# Patient Record
Sex: Male | Born: 1945 | Race: White | Hispanic: No | Marital: Married | Smoking: Never smoker
Health system: Southern US, Community
[De-identification: ages and names within clinical notes are randomized; demographics above are authoritative.]

## PROBLEM LIST (undated history)

## (undated) DIAGNOSIS — M51369 Other intervertebral disc degeneration, lumbar region without mention of lumbar back pain or lower extremity pain: Secondary | ICD-10-CM

## (undated) DIAGNOSIS — I219 Acute myocardial infarction, unspecified: Secondary | ICD-10-CM

## (undated) DIAGNOSIS — I10 Essential (primary) hypertension: Secondary | ICD-10-CM

## (undated) DIAGNOSIS — I5022 Chronic systolic (congestive) heart failure: Secondary | ICD-10-CM

## (undated) DIAGNOSIS — H9190 Unspecified hearing loss, unspecified ear: Secondary | ICD-10-CM

## (undated) DIAGNOSIS — F1011 Alcohol abuse, in remission: Secondary | ICD-10-CM

## (undated) DIAGNOSIS — E119 Type 2 diabetes mellitus without complications: Secondary | ICD-10-CM

## (undated) DIAGNOSIS — I739 Peripheral vascular disease, unspecified: Secondary | ICD-10-CM

## (undated) DIAGNOSIS — Z972 Presence of dental prosthetic device (complete) (partial): Secondary | ICD-10-CM

## (undated) DIAGNOSIS — Z7902 Long term (current) use of antithrombotics/antiplatelets: Secondary | ICD-10-CM

## (undated) DIAGNOSIS — K08109 Complete loss of teeth, unspecified cause, unspecified class: Secondary | ICD-10-CM

## (undated) DIAGNOSIS — I6529 Occlusion and stenosis of unspecified carotid artery: Secondary | ICD-10-CM

## (undated) DIAGNOSIS — I2510 Atherosclerotic heart disease of native coronary artery without angina pectoris: Secondary | ICD-10-CM

## (undated) DIAGNOSIS — M1990 Unspecified osteoarthritis, unspecified site: Secondary | ICD-10-CM

## (undated) DIAGNOSIS — G629 Polyneuropathy, unspecified: Secondary | ICD-10-CM

## (undated) DIAGNOSIS — M5030 Other cervical disc degeneration, unspecified cervical region: Secondary | ICD-10-CM

## (undated) DIAGNOSIS — S32009A Unspecified fracture of unspecified lumbar vertebra, initial encounter for closed fracture: Secondary | ICD-10-CM

## (undated) DIAGNOSIS — K5790 Diverticulosis of intestine, part unspecified, without perforation or abscess without bleeding: Secondary | ICD-10-CM

## (undated) DIAGNOSIS — L03119 Cellulitis of unspecified part of limb: Secondary | ICD-10-CM

## (undated) DIAGNOSIS — Z955 Presence of coronary angioplasty implant and graft: Secondary | ICD-10-CM

## (undated) DIAGNOSIS — T7840XA Allergy, unspecified, initial encounter: Secondary | ICD-10-CM

## (undated) DIAGNOSIS — M5416 Radiculopathy, lumbar region: Secondary | ICD-10-CM

## (undated) DIAGNOSIS — Z794 Long term (current) use of insulin: Secondary | ICD-10-CM

## (undated) DIAGNOSIS — M5136 Other intervertebral disc degeneration, lumbar region: Secondary | ICD-10-CM

## (undated) DIAGNOSIS — G4730 Sleep apnea, unspecified: Secondary | ICD-10-CM

## (undated) DIAGNOSIS — E785 Hyperlipidemia, unspecified: Secondary | ICD-10-CM

## (undated) DIAGNOSIS — G4733 Obstructive sleep apnea (adult) (pediatric): Secondary | ICD-10-CM

## (undated) DIAGNOSIS — Z7982 Long term (current) use of aspirin: Secondary | ICD-10-CM

## (undated) DIAGNOSIS — K429 Umbilical hernia without obstruction or gangrene: Secondary | ICD-10-CM

## (undated) DIAGNOSIS — M109 Gout, unspecified: Secondary | ICD-10-CM

## (undated) HISTORY — DX: Occlusion and stenosis of unspecified carotid artery: I65.29

## (undated) HISTORY — DX: Hyperlipidemia, unspecified: E78.5

## (undated) HISTORY — DX: Unspecified osteoarthritis, unspecified site: M19.90

## (undated) HISTORY — DX: Atherosclerotic heart disease of native coronary artery without angina pectoris: I25.10

## (undated) HISTORY — PX: COLONOSCOPY: SHX174

## (undated) HISTORY — DX: Essential (primary) hypertension: I10

## (undated) HISTORY — PX: TONSILLECTOMY: SUR1361

## (undated) HISTORY — DX: Acute myocardial infarction, unspecified: I21.9

## (undated) HISTORY — DX: Type 2 diabetes mellitus without complications: E11.9

---

## 2001-09-03 ENCOUNTER — Encounter: Payer: Self-pay | Admitting: Neurology

## 2001-09-03 ENCOUNTER — Encounter: Admission: RE | Admit: 2001-09-03 | Discharge: 2001-09-03 | Payer: Self-pay | Admitting: Neurology

## 2004-07-13 ENCOUNTER — Ambulatory Visit: Payer: Self-pay

## 2006-04-22 DIAGNOSIS — I219 Acute myocardial infarction, unspecified: Secondary | ICD-10-CM

## 2006-04-22 HISTORY — DX: Acute myocardial infarction, unspecified: I21.9

## 2007-03-02 ENCOUNTER — Ambulatory Visit: Payer: Self-pay | Admitting: Internal Medicine

## 2007-06-15 ENCOUNTER — Ambulatory Visit: Payer: Self-pay | Admitting: Internal Medicine

## 2009-08-06 ENCOUNTER — Emergency Department: Payer: Self-pay | Admitting: Emergency Medicine

## 2010-03-22 HISTORY — PX: CORONARY ANGIOPLASTY WITH STENT PLACEMENT: SHX49

## 2010-03-22 HISTORY — PX: CORONARY ANGIOPLASTY: SHX604

## 2010-04-06 ENCOUNTER — Inpatient Hospital Stay: Payer: Self-pay | Admitting: Internal Medicine

## 2010-04-19 ENCOUNTER — Emergency Department: Payer: Self-pay | Admitting: Emergency Medicine

## 2010-04-19 DIAGNOSIS — I255 Ischemic cardiomyopathy: Secondary | ICD-10-CM

## 2010-04-19 DIAGNOSIS — I2510 Atherosclerotic heart disease of native coronary artery without angina pectoris: Secondary | ICD-10-CM

## 2010-04-19 DIAGNOSIS — I2119 ST elevation (STEMI) myocardial infarction involving other coronary artery of inferior wall: Secondary | ICD-10-CM

## 2010-04-19 HISTORY — DX: Atherosclerotic heart disease of native coronary artery without angina pectoris: I25.10

## 2010-04-19 HISTORY — DX: Ischemic cardiomyopathy: I25.5

## 2010-04-19 HISTORY — DX: ST elevation (STEMI) myocardial infarction involving other coronary artery of inferior wall: I21.19

## 2010-05-01 ENCOUNTER — Ambulatory Visit: Payer: Self-pay | Admitting: Internal Medicine

## 2010-05-23 ENCOUNTER — Ambulatory Visit: Payer: Self-pay | Admitting: Internal Medicine

## 2010-06-21 ENCOUNTER — Ambulatory Visit: Payer: Self-pay | Admitting: Internal Medicine

## 2010-07-22 ENCOUNTER — Ambulatory Visit: Payer: Self-pay | Admitting: Internal Medicine

## 2012-02-16 ENCOUNTER — Emergency Department: Payer: Self-pay | Admitting: Emergency Medicine

## 2012-02-16 LAB — CBC
HCT: 44.4 % (ref 40.0–52.0)
HGB: 15.7 g/dL (ref 13.0–18.0)
MCH: 32 pg (ref 26.0–34.0)
MCHC: 35.3 g/dL (ref 32.0–36.0)
MCV: 91 fL (ref 80–100)
Platelet: 176 10*3/uL (ref 150–440)
RBC: 4.9 10*6/uL (ref 4.40–5.90)
RDW: 13 % (ref 11.5–14.5)
WBC: 7 10*3/uL (ref 3.8–10.6)

## 2012-02-16 LAB — COMPREHENSIVE METABOLIC PANEL
Albumin: 4.4 g/dL (ref 3.4–5.0)
Alkaline Phosphatase: 77 U/L (ref 50–136)
Anion Gap: 10 (ref 7–16)
BUN: 10 mg/dL (ref 7–18)
Bilirubin,Total: 0.6 mg/dL (ref 0.2–1.0)
Calcium, Total: 9.6 mg/dL (ref 8.5–10.1)
Chloride: 102 mmol/L (ref 98–107)
Co2: 25 mmol/L (ref 21–32)
Creatinine: 0.72 mg/dL (ref 0.60–1.30)
EGFR (African American): >60
EGFR (Non-African Amer.): >60
Glucose: 154 mg/dL — ABNORMAL HIGH (ref 65–99)
Osmolality: 276 (ref 275–301)
Potassium: 3.8 mmol/L (ref 3.5–5.1)
SGOT(AST): 52 U/L — ABNORMAL HIGH (ref 15–37)
SGPT (ALT): 101 U/L — ABNORMAL HIGH (ref 12–78)
Sodium: 137 mmol/L (ref 136–145)
Total Protein: 7.5 g/dL (ref 6.4–8.2)

## 2012-02-16 LAB — TROPONIN I: Troponin-I: <0.02 ng/mL

## 2012-02-16 LAB — CK TOTAL AND CKMB (NOT AT ARMC)
CK, Total: 99 U/L (ref 35–232)
CK-MB: 1.9 ng/mL (ref 0.5–3.6)

## 2013-09-14 DIAGNOSIS — E782 Mixed hyperlipidemia: Secondary | ICD-10-CM | POA: Insufficient documentation

## 2013-09-21 DIAGNOSIS — M109 Gout, unspecified: Secondary | ICD-10-CM | POA: Insufficient documentation

## 2013-12-19 DIAGNOSIS — R809 Proteinuria, unspecified: Secondary | ICD-10-CM | POA: Insufficient documentation

## 2014-04-05 DIAGNOSIS — I6523 Occlusion and stenosis of bilateral carotid arteries: Secondary | ICD-10-CM | POA: Insufficient documentation

## 2014-08-09 NOTE — Consult Note (Signed)
PATIENT NAME:  Manuel [name], Manuel [name] MR#:  [id number] DATE OF BIRTH:  Oct 21, 1945  DATE OF CONSULTATION:  02/16/2012  REFERRING PHYSICIAN:   CONSULTING PHYSICIAN:  Lorma Heater H. Allena KatzPatel, MD  PRIMARY CARE PROVIDER: Dr. Yates DecampJohn Walker   ED REFERRING DOCTOR: Dr. Dorothea GlassmanPaul Malinda   CARDIOLOGIST: Dr. Gwen PoundsKowalski   CHIEF COMPLAINT: "I am not feeling well".    HISTORY OF PRESENT ILLNESS: The patient is a 69 year old white male with history of hypertension, diabetes, gout, sleep apnea, and hyperlipidemia who also has a history of coronary artery disease and had a stent placed two years ago who reports that he was doing okay up until about five days ago when he started "feeling bad". He reports that prior to having his stent placed he had symptoms of generalized weakness and feeling bad followed by chest pain leading to his stent. Due to these previous symptoms he came to the ED. When he arrived in the ED, blood work and evaluation was done which shows that his troponin was negative. EKG showed no acute abnormality. I was asked to admit the patient and when I discussed the results of the findings he stated that he would like to be discharged home and see Dr. Gwen PoundsKowalski as an outpatient. He currently has no chest pain. He denies any shortness of breath. He denies any dyspnea on exertion. No lower extremity swelling, nocturnal dyspnea, or orthopnea.   PAST MEDICAL HISTORY:  1. Diabetes.  2. Hypertension.  3. Hyperlipidemia.  4. Sleep apnea, unable to tolerate CPAP. Currently he states that he has used three CPAP machines in the past and probably would want to restart using his CPAP.  5. Chronic low back pain.  6. History of diverticulosis and diverticulitis. 7. History of gout. No flare in the past few years. 8. History of abnormal liver function studies with negative hepatitis work-up. 9. History of coronary artery disease with two stents.   ALLERGIES: None.   CURRENT MEDICATIONS:  1. Aspirin 1 tab [name].o. daily.   2. Carvedilol 25 one tab [name].o. b.i.d.  3. Enalapril 20 daily.  4. Glipizide 5 mg daily.  5. Magnesium gluconate 300 mg daily.  6. Metformin 1000 daily.  7. Plavix 75 [name].o. daily.  8. Pravastatin 80 mg at bedtime.   SOCIAL HISTORY: Does not smoke. Drinks socially. No drug abuse.   FAMILY HISTORY: Father had coronary artery bypass and also had hypertension.   REVIEW OF SYSTEMS: CONSTITUTIONAL: Denies any fevers. Complains of fatigue, weakness. No pain. No weight loss. No weight gain. EYES: No blurred or double vision. No redness. No inflammation. No glaucoma. No cataracts. ENT: No tinnitus. No ear pain. No hearing loss. No seasonal or year-round allergies. No epistaxis. No difficulty swallowing. RESPIRATORY: Denies any cough, wheezing, hemoptysis. No COPD. CARDIOVASCULAR: Denies any chest pain, orthopnea, edema, or arrhythmia. No dyspnea on exertion. No palpitations. GI: No nausea, vomiting, diarrhea. No abdominal pain. No hematemesis. No melena. No ulcers. No gastroesophageal reflux disease. GU: Denies any dysuria, hematuria, renal calculus, or frequency. ENDOCRINE: Denies any polyuria, nocturia, or thyroid problems. HEME/LYMPH: Denies anemia, easy bruisability or bleeding. SKIN: No acne. No rash. No changes in mole, hair or skin. MUSCULOSKELETAL: Denies any pain in the neck, back, or shoulder. NEUROLOGIC: No numbness. No CVA. No TIA. PSYCHIATRIC: No anxiety. No insomnia. No ADD.   PHYSICAL EXAMINATION:   VITAL SIGNS: Temperature 96.9, pulse 72, respirations 20, blood pressure initially 181/99, O2 98%.   GENERAL: The patient is a well developed, well nourished Caucasian male  in no acute distress.   HEENT: Head atraumatic, normocephalic. Pupils equally round, reactive to light and accommodation. There is no conjunctival pallor. No scleral icterus. Nasal exam shows no drainage or ulceration. Oropharynx is clear without any exudate.   NECK: No thyromegaly. No carotid bruits.   CARDIOVASCULAR:  Regular rate and rhythm. No murmurs, rubs, clicks, or gallops. PMI is not displaced.   LUNGS: Clear to auscultation bilaterally without any rales, rhonchi, or wheezing.   ABDOMEN: Soft, nontender, nondistended. Positive bowel sounds x4.   EXTREMITIES: No clubbing, cyanosis, or edema.   SKIN: No rash.   LYMPHATICS: No lymph nodes palpable.   VASCULAR: Good DP, PT pulses.   PSYCHIATRIC: Not anxious or depressed.   NEUROLOGICAL: Awake, alert, oriented x3. No focal deficits.   MUSCULOSKELETAL: There is no reproducible chest pain.   LABORATORY, DIAGNOSTIC, AND RADIOLOGICAL DATA: Glucose 154, BUN 10, creatinine 0.72, sodium 137, potassium 3.8, chloride 102, CO2 25, calcium 9.6. LFTs total protein 7.5, albumin 4.4, bilirubin total 0.6, alkaline phosphatase 77, AST 52, ALT 101. CPK 99. CK-MB 1.9. Troponin less than 0.02. WBC 7.0, hemoglobin 15.7, platelet count 176.   EKG showed normal sinus rhythm without any ST-T wave changes.   ASSESSMENT AND PLAN: The patient is a 69 year old white male with history of coronary artery disease and previous stents who presents with vague symptoms of generalized weakness ongoing for about five days in duration. He has no active chest pain. No shortness of breath. He does relate similar type of symptoms to his previous stents in the past. At this point offered him to bring him into the hospital for observation and have his cardiologist evaluate in the morning, however, the patient is interested in seeing his cardiologist tomorrow. He will be calling Dr. [name] to decide what further needs to be done. I recommended to the patient that if he starts having any chest pain or any shortness of breath he needs to come back to the ED and will need evaluation. At this time he is being discharged and he's stable per his request. He is to continue the rest of his other medications for his diabetes, hypertension, and high cholesterol.   TIME SPENT ON CONSULT: 40 minutes.    Discharge instructions were written for the patient.   ____________________________ Lacie Scotts. Allena Katz, MD shp:drc D: 02/16/2012 21:37:00 ET T: 02/17/2012 06:27:10 ET JOB#: [id number]  cc: Mckyla Deckman H. Allena Katz, MD, <Dictator> John B. Danne Harbor, MD Lamar Blinks, MD Charise Carwin MD ELECTRONICALLY SIGNED 02/21/2012 17:33

## 2014-10-05 DIAGNOSIS — I10 Essential (primary) hypertension: Secondary | ICD-10-CM | POA: Insufficient documentation

## 2014-10-18 DIAGNOSIS — Z794 Long term (current) use of insulin: Secondary | ICD-10-CM | POA: Insufficient documentation

## 2014-10-18 DIAGNOSIS — E119 Type 2 diabetes mellitus without complications: Secondary | ICD-10-CM | POA: Insufficient documentation

## 2017-01-02 ENCOUNTER — Other Ambulatory Visit: Payer: Self-pay | Admitting: Student

## 2017-01-02 DIAGNOSIS — M5440 Lumbago with sciatica, unspecified side: Secondary | ICD-10-CM

## 2017-01-03 ENCOUNTER — Other Ambulatory Visit: Payer: Self-pay | Admitting: Neurosurgery

## 2017-01-03 DIAGNOSIS — M5440 Lumbago with sciatica, unspecified side: Secondary | ICD-10-CM

## 2017-01-14 ENCOUNTER — Ambulatory Visit
Admission: RE | Admit: 2017-01-14 | Discharge: 2017-01-14 | Disposition: A | Discharge status: Home / Self Care | Payer: 59 | Source: Ambulatory Visit | Attending: Student | Admitting: Student

## 2017-01-14 DIAGNOSIS — M4316 Spondylolisthesis, lumbar region: Secondary | ICD-10-CM | POA: Insufficient documentation

## 2017-01-14 DIAGNOSIS — M48061 Spinal stenosis, lumbar region without neurogenic claudication: Secondary | ICD-10-CM | POA: Diagnosis not present

## 2017-01-14 DIAGNOSIS — M5440 Lumbago with sciatica, unspecified side: Secondary | ICD-10-CM

## 2017-01-27 ENCOUNTER — Ambulatory Visit
Admission: RE | Admit: 2017-01-27 | Discharge: 2017-01-27 | Disposition: A | Discharge status: Home / Self Care | Payer: 59 | Source: Ambulatory Visit | Attending: Student in an Organized Health Care Education/Training Program | Admitting: Student in an Organized Health Care Education/Training Program

## 2017-01-27 ENCOUNTER — Ambulatory Visit (HOSPITAL_BASED_OUTPATIENT_CLINIC_OR_DEPARTMENT_OTHER): Payer: 59 | Admitting: Student in an Organized Health Care Education/Training Program

## 2017-01-27 ENCOUNTER — Encounter: Payer: Self-pay | Admitting: Student in an Organized Health Care Education/Training Program

## 2017-01-27 VITALS — BP 160/95 | HR 72 | Temp 98.4°F | Resp 18 | Ht 68.0 in | Wt 190.0 lb

## 2017-01-27 DIAGNOSIS — I252 Old myocardial infarction: Secondary | ICD-10-CM | POA: Diagnosis not present

## 2017-01-27 DIAGNOSIS — M5116 Intervertebral disc disorders with radiculopathy, lumbar region: Secondary | ICD-10-CM | POA: Insufficient documentation

## 2017-01-27 DIAGNOSIS — M25552 Pain in left hip: Secondary | ICD-10-CM | POA: Diagnosis present

## 2017-01-27 DIAGNOSIS — M5416 Radiculopathy, lumbar region: Secondary | ICD-10-CM | POA: Diagnosis not present

## 2017-01-27 DIAGNOSIS — M51369 Other intervertebral disc degeneration, lumbar region without mention of lumbar back pain or lower extremity pain: Secondary | ICD-10-CM | POA: Insufficient documentation

## 2017-01-27 DIAGNOSIS — Z79899 Other long term (current) drug therapy: Secondary | ICD-10-CM | POA: Insufficient documentation

## 2017-01-27 DIAGNOSIS — M5136 Other intervertebral disc degeneration, lumbar region: Secondary | ICD-10-CM

## 2017-01-27 DIAGNOSIS — E785 Hyperlipidemia, unspecified: Secondary | ICD-10-CM | POA: Insufficient documentation

## 2017-01-27 DIAGNOSIS — I10 Essential (primary) hypertension: Secondary | ICD-10-CM | POA: Diagnosis not present

## 2017-01-27 DIAGNOSIS — M47816 Spondylosis without myelopathy or radiculopathy, lumbar region: Secondary | ICD-10-CM

## 2017-01-27 DIAGNOSIS — Z794 Long term (current) use of insulin: Secondary | ICD-10-CM | POA: Diagnosis not present

## 2017-01-27 DIAGNOSIS — R531 Weakness: Secondary | ICD-10-CM | POA: Diagnosis not present

## 2017-01-27 DIAGNOSIS — E119 Type 2 diabetes mellitus without complications: Secondary | ICD-10-CM | POA: Insufficient documentation

## 2017-01-27 MED ORDER — SODIUM CHLORIDE 0.9 % IJ SOLN
INTRAMUSCULAR | Status: AC
  Filled 2017-01-27: qty 10

## 2017-01-27 MED ORDER — IOPAMIDOL (ISOVUE-M 200) INJECTION 41%
10.0000 mL | Freq: Once | INTRAMUSCULAR | Status: AC
  Administered 2017-01-27: 20 mL
  Filled 2017-01-27: qty 10

## 2017-01-27 MED ORDER — LIDOCAINE HCL (PF) 1 % IJ SOLN
10.0000 mL | Freq: Once | INTRAMUSCULAR | Status: AC
  Administered 2017-01-27: 5 mL
  Filled 2017-01-27: qty 10

## 2017-01-27 MED ORDER — DEXAMETHASONE SODIUM PHOSPHATE 10 MG/ML IJ SOLN
10.0000 mg | Freq: Once | INTRAMUSCULAR | Status: AC
  Administered 2017-01-27: 10 mg
  Filled 2017-01-27: qty 1

## 2017-01-27 MED ORDER — ROPIVACAINE HCL 2 MG/ML IJ SOLN
9.0000 mL | Freq: Once | INTRAMUSCULAR | Status: AC
  Administered 2017-01-27: 10 mL via INTRA_ARTICULAR
  Filled 2017-01-27: qty 10

## 2017-01-27 MED ORDER — SODIUM CHLORIDE 0.9% FLUSH
2.0000 mL | Freq: Once | INTRAVENOUS | Status: AC
  Administered 2017-01-27: 10 mL

## 2017-01-27 NOTE — Patient Instructions (Signed)
Pain Management Discharge Instructions  General Discharge Instructions :  If you need to reach your doctor call: Monday-Friday 8:00 am - 4:00 pm at [phone] or toll free [phone].  After clinic hours [phone] to have operator reach doctor.  Bring all of your medication bottles to all your appointments in the pain clinic.  To cancel or reschedule your appointment with Pain Management please remember to call 24 hours in advance to avoid a fee.  Refer to the educational materials which you have been given on: General Risks, I had my Procedure. Discharge Instructions, Post Sedation.  Post Procedure Instructions:  The drugs you were given will stay in your system until tomorrow, so for the next 24 hours you should not drive, make any legal decisions or drink any alcoholic beverages.  You may eat anything you prefer, but it is better to start with liquids then soups and crackers, and gradually work up to solid foods.  Please notify your doctor immediately if you have any unusual bleeding, trouble breathing or pain that is not related to your normal pain.  Depending on the type of procedure that was done, some parts of your body may feel week and/or numb.  This usually clears up by tonight or the next day.  Walk with the use of an assistive device or accompanied by an adult for the 24 hours.  You may use ice on the affected area for the first 24 hours.  Put ice in a Ziploc bag and cover with a towel and place against area 15 minutes on 15 minutes off.  You may switch to heat after 24 hours.Epidural Steroid Injection An epidural steroid injection is a shot of steroid medicine and numbing medicine that is given into the space between the spinal cord and the bones in your back (epidural space). The shot helps relieve pain caused by an irritated or swollen nerve root. The amount of pain relief you get from the injection depends on what is causing the nerve to be swollen and irritated,  and how long your pain lasts. You are more likely to benefit from this injection if your pain is strong and comes on suddenly rather than if you have had pain for a long time. Tell a health care provider about:  Any allergies you have.  All medicines you are taking, including vitamins, herbs, eye drops, creams, and over-the-counter medicines.  Any problems you or family members have had with anesthetic medicines.  Any blood disorders you have.  Any surgeries you have had.  Any medical conditions you have.  Whether you are pregnant or may be pregnant. What are the risks? Generally, this is a safe procedure. However, problems may occur, including:  Headache.  Bleeding.  Infection.  Allergic reaction to medicines.  Damage to your nerves. What happens before the procedure? Staying hydrated  Follow instructions from your health care provider about hydration, which may include:  Up to 2 hours before the procedure - you may continue to drink clear liquids, such as water, clear fruit juice, black coffee, and plain tea. Eating and drinking restrictions  Follow instructions from your health care provider about eating and drinking, which may include:  8 hours before the procedure - stop eating heavy meals or foods such as meat, fried foods, or fatty foods.  6 hours before the procedure - stop eating light meals or foods, such as toast or cereal.  6 hours before the procedure - stop drinking milk or drinks that contain milk.    2 hours before the procedure - stop drinking clear liquids. Medicine  You may be given medicines to lower anxiety.  Ask your health care provider about:  Changing or stopping your regular medicines. This is especially important if you are taking diabetes medicines or blood thinners.  Taking medicines such as aspirin and ibuprofen. These medicines can thin your blood. Do not take these medicines before your procedure if your health care provider instructs  you not to. General instructions  Plan to have someone take you home from the hospital or clinic. What happens during the procedure?  You may receive a medicine to help you relax (sedative).  You will be asked to lie on your abdomen.  The injection site will be cleaned.  A numbing medicine (local anesthetic) will be used to numb the injection site.  A needle will be inserted through your skin into the epidural space. You may feel some discomfort when this happens. An X-ray machine will be used to make sure the needle is put as close as possible to the affected nerve.  A steroid medicine and a local anesthetic will be injected into the epidural space.  The needle will be removed.  A bandage (dressing) will be put over the injection site. What happens after the procedure?  Your blood pressure, heart rate, breathing rate, and blood oxygen level will be monitored until the medicines you were given have worn off.  Your arm or leg may feel weak or numb for a few hours.  The injection site may feel sore.  Do not drive for 24 hours if you received a sedative. This information is not intended to replace advice given to you by your health care provider. Make sure you discuss any questions you have with your health care provider. Document Released: 07/16/2007 Document Revised: 09/20/2015 Document Reviewed: 07/25/2015 [employer] Interactive Patient Education  2017 [employer]  

## 2017-01-27 NOTE — Progress Notes (Signed)
Patient's Name: Manuel [name]  MRN: [id number]  Referring Provider: Marin Olp, PA-C  DOB: 28-Jun-1945  PCP: Default, Provider, MD  DOS: 01/27/2017  Note by: Gillis Santa, MD  Service setting: Ambulatory outpatient  Specialty: Interventional Pain Management  Location: [facility] (AMB) Pain Management Facility    Patient type: New patient ("FAST-TRACK" Evaluation)   Warning: This referral option does not include the extensive pharmacological evaluation required for [place] to take over the patient's medication management. The "Fast-Track" system is designed to bypass the new patient referral waiting list, as well as the normal patient evaluation process, in order to provide a patient in distress with a timely pain management intervention. Because the system was not designed to unfairly get a patient into our pain practice ahead of those already waiting, certain restrictions apply. By requesting a "Fast-Track" consult, the referring physician has opted to continue managing the patient's medications in order to get interventional urgent care.  Primary Reason for Visit: Interventional Pain Management Treatment. CC: Hip Pain (left buttocks )   Procedure  HPI  Manuel [name] is a 71 y.o. year old, male patient, who comes today for a  "Fast-Track" new patient evaluation, as requested by Marin Olp, PA-C. The patient has been made aware that this type of referral option is reserved for the Interventional Pain Management portion of our practice and completely excludes the option of medication management. His primarily concern today is the Hip Pain (left buttocks )  Pain Assessment: Location: Left Buttocks Radiating: down the left leg on the outer aspect of the leg that went around to the inside of the calf and ankle. mainly on Left side but is now affecting the right side.  Onset: More than a month ago Duration: Chronic pain Quality: Dull, Discomfort, Nagging, Constant, Other (Comment) (weakness in left  leg) Severity: 3 /10 (self-reported pain score)  Note: Reported level is compatible with observation.                   When using our objective Pain Scale, levels between 6 and 10/10 are said to belong in an emergency room, as it progressively worsens from a 6/10, described as severely limiting, requiring emergency care not usually available at an outpatient pain management facility. At a 6/10 level, communication becomes difficult and requires great effort. Assistance to reach the emergency department may be required. Facial flushing and profuse sweating along with potentially dangerous increases in heart rate and blood pressure will be evident. Effect on ADL: has difficulty maintaining chores at home and increases the intensity of pain.  going up and down steps is difficult. feels that it has affected gait  Timing: Constant Modifying factors: advil  Onset and Duration: Sudden and Present less than 3 months Cause of pain: patient stepped wrong and felt something pop and has had pain since that time.  Severity: Getting better, NAS-11 at its worse: 10/10, NAS-11 at its best: 2/10, NAS-11 now: 3/10 and NAS-11 on the average: 5/10 Timing: Night Aggravating Factors: Climbing, Lifiting, Motion, Walking and Walking uphill Alleviating Factors: Cold packs, Sleeping and Chiropractic manipulations Associated Problems: Night-time cramps, Depression, Fatigue, Weakness and Pain that does not allow patient to sleep Quality of Pain: Constant, Dull and Nagging Previous Examinations or Tests: Chiropractic evaluation Previous Treatments: The patient denies any treatment  The patient comes into the clinics today, referred to [place] for a lumbar epidural steroid injection under fluoroscopy.  Meds   Current Outpatient Prescriptions:  .  amLODipine (NORVASC) 5 MG tablet,  Take 1 tablet by mouth daily., Disp: , Rfl:  .  carvedilol (COREG) 12.5 MG tablet, Take 1 tablet by mouth 2 (two) times daily., Disp: , Rfl:  .   insulin aspart protamine- aspart (NOVOLOG MIX 70/30) (70-30) 100 UNIT/ML injection, Inject 20 Units into the skin daily with breakfast., Disp: , Rfl:  .  lisinopril (PRINIVIL,ZESTRIL) 20 MG tablet, Take 1 tablet by mouth daily., Disp: , Rfl:  .  pravastatin (PRAVACHOL) 40 MG tablet, Take 1 tablet by mouth at bedtime., Disp: , Rfl:  .  amLODipine (NORVASC) 5 MG tablet, Take 1 tablet by mouth daily., Disp: , Rfl:  .  Ibuprofen 200 MG CAPS, Take 2 tablets by mouth 3 times/day as needed-between meals & bedtime., Disp: , Rfl:   Imaging Review  Cervical Imaging: Cervical MR wo contrast: No results found for this or any previous visit. Cervical MR wo contrast:  Results for orders placed in visit on 03/02/07  MR [facility] W/O Cm   Narrative  PRIOR REPORT IMPORTED FROM AN EXTERNAL SYSTEM   PRIOR REPORT IMPORTED FROM THE [facility] [place] EXAM:    neck pain  COMMENTS:   PROCEDURE:     MR  - MR CERVICAL SPINE WO CONT  - Mar 02 2007  8:17PM   RESULT:     Non gadolinium MR imaging of the cervical spine is performed  in  the standard fashion. The study demonstrates multilevel disc protrusion  most  pronounced at C7-T1 along with C6-C7 and C5-C6 with mild disc bulge at  C4-C5. There is no definite abnormal cord deformity or cord signal. No  severe spinal canal stenosis is evident. There is moderate spinal canal  narrowing at the C7-T1 level. There is bilateral foraminal stenosis at  C7-T1  with moderate to severe RIGHT foraminal stenosis at C6-C7 and RIGHT-sided  foraminal stenosis that is moderately severe at C5-C6 with RIGHT-sided  moderate spinal stenosis at C3-C4 and LEFT-sided severe foraminal stenosis  at C3-C4. The vertebral body heights and signals appear to be maintained  with no evidence of edema.   IMPRESSION:   Areas of spinal canal narrowing with foraminal stenosis. If the patient  has  radicular symptoms then surgical consultation would be recommended.    Thank you for the opportunity to contribute to the care of your patient.        Lumbosacral Imaging: Lumbar MR wo contrast:  Results for orders placed during the hospital encounter of 01/14/17  MR LUMBAR SPINE WO CONTRAST   Narrative CLINICAL DATA:  71 y/o M; lower back pain with left-greater-than-right lower extremity pain.  EXAM: MRI LUMBAR SPINE WITHOUT CONTRAST  TECHNIQUE: Multiplanar, multisequence MR imaging of the lumbar spine was performed. No intravenous contrast was administered.  COMPARISON:  None.  FINDINGS: Segmentation:  Standard.  Alignment: Mild lumbar levocurvature with apex at L4. L4-5 grade 1 anterolisthesis.  Vertebrae: Mild L2-3 and L3-4 degenerative endplate edema. No evidence for acute fracture, diskitis, or suspicious bone lesion.  Conus medullaris: Extends to the L1-2 level and appears normal.  Paraspinal and other soft tissues: Negative.  Disc levels:  L1-2: No significant disc displacement, foraminal stenosis, or canal stenosis. Mild facet hypertrophy.  L2-3: Disc bulge with mild facet hypertrophy. Mild foraminal and canal stenosis.  L3-4: Disc bulge with endplate marginal osteophytes combined with moderate facet and mild ligamentum hypertrophy. Mild bilateral foraminal stenosis and moderate canal stenosis.  L4-5: Anterolisthesis, disc bulge, and advanced facet hypertrophy with mild  ligamentum flavum hypertrophy. Mild bilateral foraminal stenosis and moderate to severe canal stenosis.  L5-S1: Disc bulge eccentric to the left with advanced bilateral facet hypertrophy. Mild bilateral foraminal stenosis. No significant canal stenosis.  IMPRESSION: 1. Mild lumbar levocurvature and L4-5 grade 1 anterolisthesis. 2. Moderate lumbar spondylosis with advanced lower lumbar facet arthropathy. 3. Multifactorial moderate L3-4 and moderate to severe L4-5 canal stenosis. 4. Multilevel mild foraminal stenosis. No high-grade  foraminal stenosis.   Electronically Signed   By: Kristine Garbe M.D.   On: 01/15/2017 06:43       Complexity Note: Imaging results reviewed. Results shared with Mr. Lacock, using Layman's terms.                         ROS  Cardiovascular History: High blood pressure and Heart attack ( Date: 2008) Pulmonary or Respiratory History: No reported pulmonary signs or symptoms such as wheezing and difficulty taking a deep full breath (Asthma), difficulty blowing air out (Emphysema), coughing up mucus (Bronchitis), persistent dry cough, or temporary stoppage of breathing during sleep Neurological History: No reported neurological signs or symptoms such as seizures, abnormal skin sensations, urinary and/or fecal incontinence, being born with an abnormal open spine and/or a tethered spinal cord Review of Past Neurological Studies: No results found for this or any previous visit. Psychological-Psychiatric History: No reported psychological or psychiatric signs or symptoms such as difficulty sleeping, anxiety, depression, delusions or hallucinations (schizophrenial), mood swings (bipolar disorders) or suicidal ideations or attempts Gastrointestinal History: No reported gastrointestinal signs or symptoms such as vomiting or evacuating blood, reflux, heartburn, alternating episodes of diarrhea and constipation, inflamed or scarred liver, or pancreas or irrregular and/or infrequent bowel movements Genitourinary History: No reported renal or genitourinary signs or symptoms such as difficulty voiding or producing urine, peeing blood, non-functioning kidney, kidney stones, difficulty emptying the bladder, difficulty controlling the flow of urine, or chronic kidney disease Hematological History: No reported hematological signs or symptoms such as prolonged bleeding, low or poor functioning platelets, bruising or bleeding easily, hereditary bleeding problems, low energy levels due to low hemoglobin or  being anemic Endocrine History: High blood sugar requiring insulin (IDDM) Rheumatologic History: No reported rheumatological signs and symptoms such as fatigue, joint pain, tenderness, swelling, redness, heat, stiffness, decreased range of motion, with or without associated rash Musculoskeletal History: Negative for myasthenia gravis, muscular dystrophy, multiple sclerosis or malignant hyperthermia Work History: Retired  Allergies  Mr. Ruz has No Known Allergies.  Laboratory Chemistry  Inflammation Markers (CRP: Acute Phase) (ESR: Chronic Phase) No results found for: CRP, ESRSEDRATE               Renal Function Markers Lab Results  Component Value Date   BUN 10 02/16/2012   CREATININE 0.72 02/16/2012   GFRAA >60 02/16/2012   GFRNONAA >60 02/16/2012                 Hepatic Function Markers Lab Results  Component Value Date   AST 52 (H) 02/16/2012   ALT 101 (H) 02/16/2012   ALBUMIN 4.4 02/16/2012   ALKPHOS 77 02/16/2012                 Electrolytes Lab Results  Component Value Date   NA 137 02/16/2012   K 3.8 02/16/2012   CL 102 02/16/2012   CALCIUM 9.6 02/16/2012                 Neuropathy Markers No results found for:  [id number]               Bone Pathology Markers Lab Results  Component Value Date   ALKPHOS 77 02/16/2012   CALCIUM 9.6 02/16/2012                 Coagulation Parameters Lab Results  Component Value Date   PLT 176 02/16/2012                 Cardiovascular Markers Lab Results  Component Value Date   HGB 15.7 02/16/2012   HCT 44.4 02/16/2012                 Note: Lab results reviewed.  PFSH  Drug: Mr. Steil  reports that he does not use drugs. Alcohol:  reports that he drinks alcohol. Tobacco:  reports that he has never smoked. He has never used smokeless tobacco. Medical:  has a past medical history of Arthritis; Diabetes mellitus without complication ([place]); Hyperlipidemia; Hypertension; and Myocardial infarction [facility])  (2008). Family: family history includes Heart disease in his father; Scoliosis in his mother.  Past Surgical History:  Procedure Laterality Date  . TONSILLECTOMY     had removed as a child   Active Ambulatory Problems    Diagnosis Date Noted  . Lumbar radiculopathy 01/27/2017  . Lumbar degenerative disc disease 01/27/2017  . Lumbar spondylosis 01/27/2017  . Lumbar facet arthropathy 01/27/2017   Resolved Ambulatory Problems    Diagnosis Date Noted  . No Resolved Ambulatory Problems   Past Medical History:  Diagnosis Date  . Arthritis   . Diabetes mellitus without complication ([facility])   . Hyperlipidemia   . Hypertension   . Myocardial infarction [facility]) 2008   Constitutional Exam  General appearance: Well nourished, well developed, and well hydrated. In no apparent acute distress Vitals:   01/27/17 1015 01/27/17 1020 01/27/17 1025 01/27/17 1028  BP: (!) 167/105 (!) 169/102 (!) 166/98 (!) 160/95  Pulse: 77 76 77 72  Resp: '20 20 18 18  ' Temp:      TempSrc:      SpO2: 99% 99% 99% 99%  Weight:      Height:       BMI Assessment: Estimated body mass index is 28.89 kg/m as calculated from the following:   Height as of this encounter: '5\' 8"'  (1.727 m).   Weight as of this encounter: 190 lb (86.2 kg).  BMI interpretation table: BMI level Category Range association with higher incidence of chronic pain  <18 kg/m2 Underweight   18.5-24.9 kg/m2 Ideal body weight   25-29.9 kg/m2 Overweight Increased incidence by 20%  30-34.9 kg/m2 Obese (Class I) Increased incidence by 68%  35-39.9 kg/m2 Severe obesity (Class II) Increased incidence by 136%  >40 kg/m2 Extreme obesity (Class III) Increased incidence by 254%   BMI Readings from Last 4 Encounters:  01/27/17 28.89 kg/m   Wt Readings from Last 4 Encounters:  01/27/17 190 lb (86.2 kg)  Psych/Mental status: Alert, oriented x 3 (person, place, & time)       Eyes: [name] Respiratory: No evidence of acute respiratory distress  Lumbar  Spine Area Exam  Skin & Axial Inspection: No masses, redness, or swelling Alignment: Symmetrical Functional ROM: Unrestricted ROM      Stability: No instability detected Muscle Tone/Strength: Functionally intact. No obvious neuro-muscular anomalies detected. Sensory (Neurological): Unimpaired Palpation: No palpable anomalies       Provocative Tests: Lumbar Hyperextension and rotation test: Positive on the left for facet joint pain. Lumbar  Lateral bending test: Positive ipsilateral radicular pain, on the left. Positive for left-sided foraminal stenosis. Patrick's Maneuver: evaluation deferred today                    Gait & Posture Assessment  Ambulation: Unassisted Gait: Relatively normal for age and body habitus Posture: WNL   Lower Extremity Exam    Side: Right lower extremity  Side: Left lower extremity  Skin & Extremity Inspection: Skin color, temperature, and hair growth are WNL. No peripheral edema or cyanosis. No masses, redness, swelling, asymmetry, or associated skin lesions. No contractures.  Skin & Extremity Inspection: Skin color, temperature, and hair growth are WNL. No peripheral edema or cyanosis. No masses, redness, swelling, asymmetry, or associated skin lesions. No contractures.  Functional ROM: Unrestricted ROM          Functional ROM: Unrestricted ROM          Muscle Tone/Strength: Functionally intact. No obvious neuro-muscular anomalies detected.  Muscle Tone/Strength: Functionally intact. No obvious neuro-muscular anomalies detected.  Sensory (Neurological): Unimpaired  Sensory (Neurological): Unimpaired  Palpation: No palpable anomalies  Palpation: No palpable anomalies    Procedure  71 year old male with a history of lumbosacral radiculopathy affecting the left side. Patient is here for left epidural steroid injection under fluoroscopy. See procedure note.

## 2017-01-27 NOTE — Progress Notes (Signed)
Safety precautions to be maintained throughout the outpatient stay will include: orient to surroundings, keep bed in low position, maintain call bell within reach at all times, provide assistance with transfer out of bed and ambulation.  

## 2017-01-27 NOTE — Progress Notes (Signed)
Patient's Name: Manuel [name]  MRN: [id number]  Referring Provider: Ivar Drape, Cordelia Poche  DOB: 27-Jul-1945  PCP: Default, Provider, MD  DOS: 01/27/2017  Note by: Edward Jolly, MD  Service setting: Ambulatory outpatient  Specialty: Interventional Pain Management  Patient type: Established  Location: [facility] (AMB) Pain Management Facility  Visit type: Interventional Procedure   Primary Reason for Visit: Interventional Pain Management Treatment. CC: Hip Pain (left buttocks )  Procedure:  Anesthesia, Analgesia, Anxiolysis:  Type: Therapeutic Inter-Laminar Epidural Steroid Injection #1 Region: Lumbar Level: L5-S1 Level. Laterality: Left-Sided         Type: Local Anesthesia with Moderate (Conscious) Sedation Local Anesthetic: Lidocaine 1% Route: Intravenous (IV) IV Access: Secured Sedation: Meaningful verbal contact was maintained at all times during the procedure  Indication(s): Analgesia and Anxiety   Indications: 1. Lumbar radiculopathy   2. Lumbar degenerative disc disease   3. Lumbar spondylosis   4. Lumbar facet arthropathy    Pain Score: Pre-procedure: 3 /10 Post-procedure: 0-No pain/10  Pre-op Assessment:  Manuel [name] is a 71 y.o. (year old), male patient, seen today for interventional treatment. He  has a past surgical history that includes Tonsillectomy. Manuel [name] has a current medication list which includes the following prescription(s): amlodipine, carvedilol, insulin aspart protamine- aspart, lisinopril, pravastatin, amlodipine, and ibuprofen. His primarily concern today is the Hip Pain (left buttocks )  Initial Vital Signs: Blood pressure (!) 146/92, pulse 75, temperature 98.4 F (36.9 C), temperature source Oral, resp. rate 16, height  (1.727 m), weight 190 lb (86.2 kg), SpO2 97 %. BMI: Estimated body mass index is 28.89 kg/m as calculated from the following:   Height as of this encounter:  (1.727 m).   Weight as of this encounter: 190 lb (86.2 kg).  Risk  Assessment: Allergies: Reviewed. He has No Known Allergies.  Allergy Precautions: None required Coagulopathies: Reviewed. None identified.  Blood-thinner therapy: None at this time Active Infection(s): Reviewed. None identified. Manuel [name] is afebrile  Site Confirmation: Manuel [name] was asked to confirm the procedure and laterality before marking the site Procedure checklist: Completed Consent: Before the procedure and under the influence of no sedative(s), amnesic(s), or anxiolytics, the patient was informed of the treatment options, risks and possible complications. To fulfill our ethical and legal obligations, as recommended by the American Medical Association's Code of Ethics, I have informed the patient of my clinical impression; the nature and purpose of the treatment or procedure; the risks, benefits, and possible complications of the intervention; the alternatives, including doing nothing; the risk(s) and benefit(s) of the alternative treatment(s) or procedure(s); and the risk(s) and benefit(s) of doing nothing. The patient was provided information about the general risks and possible complications associated with the procedure. These may include, but are not limited to: failure to achieve desired goals, infection, bleeding, organ or nerve damage, allergic reactions, paralysis, and death. In addition, the patient was informed of those risks and complications associated to Spine-related procedures, such as failure to decrease pain; infection (i.e.: Meningitis, epidural or intraspinal abscess); bleeding (i.e.: epidural hematoma, subarachnoid hemorrhage, or any other type of intraspinal or peri-dural bleeding); organ or nerve damage (i.e.: Any type of peripheral nerve, nerve root, or spinal cord injury) with subsequent damage to sensory, motor, and/or autonomic systems, resulting in permanent pain, numbness, and/or weakness of one or several areas of the body; allergic reactions; (i.e.: anaphylactic  reaction); and/or death. Furthermore, the patient was informed of those risks and complications associated with the medications. These include, but  are not limited to: allergic reactions (i.e.: anaphylactic or anaphylactoid reaction(s)); adrenal axis suppression; blood sugar elevation that in diabetics may result in ketoacidosis or comma; water retention that in patients with history of congestive heart failure may result in shortness of breath, pulmonary edema, and decompensation with resultant heart failure; weight gain; swelling or edema; medication-induced neural toxicity; particulate matter embolism and blood vessel occlusion with resultant organ, and/or nervous system infarction; and/or aseptic necrosis of one or more joints. Finally, the patient was informed that Medicine is not an exact science; therefore, there is also the possibility of unforeseen or unpredictable risks and/or possible complications that may result in a catastrophic outcome. The patient indicated having understood very clearly. We have given the patient no guarantees and we have made no promises. Enough time was given to the patient to ask questions, all of which were answered to the patient's satisfaction. Manuel [name] has indicated that he wanted to continue with the procedure. Attestation: I, the ordering provider, attest that I have discussed with the patient the benefits, risks, side-effects, alternatives, likelihood of achieving goals, and potential problems during recovery for the procedure that I have provided informed consent. Date: 01/27/2017; Time: 9:26 AM  Pre-Procedure Preparation:  Monitoring: As per clinic protocol. Respiration, ETCO2, SpO2, BP, heart rate and rhythm monitor placed and checked for adequate function Safety Precautions: Patient was assessed for positional comfort and pressure points before starting the procedure. Time-out: I initiated and conducted the "Time-out" before starting the procedure, as per  protocol. The patient was asked to participate by confirming the accuracy of the "Time Out" information. Verification of the correct person, site, and procedure were performed and confirmed by me, the nursing staff, and the patient. "Time-out" conducted as per Joint Commission's Universal Protocol (UP.01.01.01). "Time-out" Date & Time: 01/27/2017; 1014 hrs.  Description of Procedure Process:   Position: Prone with head of the table was raised to facilitate breathing. Target Area: The interlaminar space, initially targeting the lower laminar border of the superior vertebral body. Approach: Paramedial approach. Area Prepped: Entire Posterior Lumbar Region Prepping solution: ChloraPrep (2% chlorhexidine gluconate and 70% isopropyl alcohol) Safety Precautions: Aspiration looking for blood return was conducted prior to all injections. At no point did we inject any substances, as a needle was being advanced. No attempts were made at seeking any paresthesias. Safe injection practices and needle disposal techniques used. Medications properly checked for expiration dates. SDV (single dose vial) medications used. Description of the Procedure: Protocol guidelines were followed. The procedure needle was introduced through the skin, ipsilateral to the reported pain, and advanced to the target area. Bone was contacted and the needle walked caudad, until the lamina was cleared. The epidural space was identified using "loss-of-resistance technique" with 2-3 ml of PF-NaCl (0.9% NSS), in a 5cc LOR glass syringe. Vitals:   01/27/17 1015 01/27/17 1020 01/27/17 1025 01/27/17 1028  BP: (!) 167/105 (!) 169/102 (!) 166/98 (!) 160/95  Pulse: 77 76 77 72  Resp: Temp:      TempSrc:      SpO2: 99% 99% 99% 99%  Weight:      Height:        Start Time: 1018 hrs. End Time: 1024 hrs. Materials:  Needle(s) Type: Epidural needle Gauge: 17G Length: 3.5-in Medication(s): We administered iopamidol, dexamethasone,  lidocaine (PF), ropivacaine (PF) 2 mg/mL (0.2%), and sodium chloride flush. Please see chart orders for dosing details. 5 cc of preservative-free normal saline, 1 cc of Decadron 10  mg/cc, 2 cc of 0.2% ropivacaine equals 8 cc total Imaging Guidance (Spinal):  Type of Imaging Technique: Fluoroscopy Guidance (Spinal) Indication(s): Assistance in needle guidance and placement for procedures requiring needle placement in or near specific anatomical locations not easily accessible without such assistance. Exposure Time: Please see nurses notes. Contrast: Before injecting any contrast, we confirmed that the patient did not have an allergy to iodine, shellfish, or radiological contrast. Once satisfactory needle placement was completed at the desired level, radiological contrast was injected. Contrast injected under live fluoroscopy. No contrast complications. See chart for type and volume of contrast used. Fluoroscopic Guidance: I was personally present during the use of fluoroscopy. "Tunnel Vision Technique" used to obtain the best possible view of the target area. Parallax error corrected before commencing the procedure. "Direction-depth-direction" technique used to introduce the needle under continuous pulsed fluoroscopy. Once target was reached, antero-posterior, oblique, and lateral fluoroscopic projection used confirm needle placement in all planes. Images permanently stored in EMR. Interpretation: I personally interpreted the imaging intraoperatively. Adequate needle placement confirmed in multiple planes. Appropriate spread of contrast into desired area was observed. No evidence of afferent or efferent intravascular uptake. No intrathecal or subarachnoid spread observed. Permanent images saved into the patient's record.  Antibiotic Prophylaxis:  Indication(s): None identified Antibiotic given: None  Post-operative Assessment:  EBL: None Complications: No immediate post-treatment complications  observed by team, or reported by patient. Note: The patient tolerated the entire procedure well. A repeat set of vitals were taken after the procedure and the patient was kept under observation following institutional policy, for this type of procedure. Post-procedural neurological assessment was performed, showing return to baseline, prior to discharge. The patient was provided with post-procedure discharge instructions, including a section on how to identify potential problems. Should any problems arise concerning this procedure, the patient was given instructions to immediately contact us, at any time, without hesitation. In any case, we plan to contact the patient by telephone for a follow-up status report regarding this interventional procedure. Comments:  No additional relevant information. 5 out of 5 strength bilateral lower extremity: Plantar flexion, dorsiflexion, knee flexion, knee extension. Plan of Care    Imaging Orders     DG C-Arm 1-60 Min-No Report Procedure Orders    No procedure(s) ordered today    Medications ordered for procedure: Meds ordered this encounter  Medications  . iopamidol (ISOVUE-M) 41 % intrathecal injection 10 mL  . dexamethasone (DECADRON) injection 10 mg  . lidocaine (PF) (XYLOCAINE) 1 % injection 10 mL  . ropivacaine (PF) 2 mg/mL (0.2%) (NAROPIN) injection 9 mL  . sodium chloride flush (NS) 0.9 % injection 2 mL   Medications administered: We administered iopamidol, dexamethasone, lidocaine (PF), ropivacaine (PF) 2 mg/mL (0.2%), and sodium chloride flush.  See the medical record for exact dosing, route, and time of administration.  New Prescriptions   No medications on file   Disposition: Discharge home  Discharge Date & Time: 01/27/2017; 1028 hrs.   Physician-requested Follow-up: Return in about 2 weeks (around 02/10/2017) for Post Procedure Evaluation. Future Appointments Date Time Provider Department Center  02/25/2017 12:15 PM Edward Jolly, MD  [facility] None   Primary Care Physician: Default, Provider, MD Location: [facility] Note by: Edward Jolly, MD Date: 01/27/2017; Time: 4:32 PM  Disclaimer:  Medicine is not an exact science. The only guarantee in medicine is that nothing is guaranteed. It is important to note that the decision to proceed with this intervention was based on the information  collected from the patient. The Data and conclusions were drawn from the patient's questionnaire, the interview, and the physical examination. Because the information was provided in large part by the patient, it cannot be guaranteed that it has not been purposely or unconsciously manipulated. Every effort has been made to obtain as much relevant data as possible for this evaluation. It is important to note that the conclusions that lead to this procedure are derived in large part from the available data. Always take into account that the treatment will also be dependent on availability of resources and existing treatment guidelines, considered by other Pain Management Practitioners as being common knowledge and practice, at the time of the intervention. For Medico-Legal purposes, it is also important to point out that variation in procedural techniques and pharmacological choices are the acceptable norm. The indications, contraindications, technique, and results of the above procedure should only be interpreted and judged by a Board-Certified Interventional Pain Specialist with extensive familiarity and expertise in the same exact procedure and technique.

## 2017-02-25 ENCOUNTER — Ambulatory Visit
Payer: 59 | Attending: Student in an Organized Health Care Education/Training Program | Admitting: Student in an Organized Health Care Education/Training Program

## 2017-02-25 ENCOUNTER — Encounter: Payer: Self-pay | Admitting: Student in an Organized Health Care Education/Training Program

## 2017-02-25 VITALS — BP 174/85 | HR 78 | Temp 98.2°F | Resp 16 | Ht 68.0 in | Wt 181.0 lb

## 2017-02-25 DIAGNOSIS — I10 Essential (primary) hypertension: Secondary | ICD-10-CM | POA: Insufficient documentation

## 2017-02-25 DIAGNOSIS — Z8269 Family history of other diseases of the musculoskeletal system and connective tissue: Secondary | ICD-10-CM | POA: Insufficient documentation

## 2017-02-25 DIAGNOSIS — E119 Type 2 diabetes mellitus without complications: Secondary | ICD-10-CM | POA: Insufficient documentation

## 2017-02-25 DIAGNOSIS — Z9889 Other specified postprocedural states: Secondary | ICD-10-CM | POA: Diagnosis not present

## 2017-02-25 DIAGNOSIS — Z794 Long term (current) use of insulin: Secondary | ICD-10-CM | POA: Diagnosis not present

## 2017-02-25 DIAGNOSIS — I252 Old myocardial infarction: Secondary | ICD-10-CM | POA: Diagnosis not present

## 2017-02-25 DIAGNOSIS — M47816 Spondylosis without myelopathy or radiculopathy, lumbar region: Secondary | ICD-10-CM | POA: Insufficient documentation

## 2017-02-25 DIAGNOSIS — M1990 Unspecified osteoarthritis, unspecified site: Secondary | ICD-10-CM | POA: Insufficient documentation

## 2017-02-25 DIAGNOSIS — Z79899 Other long term (current) drug therapy: Secondary | ICD-10-CM | POA: Diagnosis not present

## 2017-02-25 DIAGNOSIS — Z8249 Family history of ischemic heart disease and other diseases of the circulatory system: Secondary | ICD-10-CM | POA: Insufficient documentation

## 2017-02-25 DIAGNOSIS — M5136 Other intervertebral disc degeneration, lumbar region: Secondary | ICD-10-CM | POA: Insufficient documentation

## 2017-02-25 DIAGNOSIS — E785 Hyperlipidemia, unspecified: Secondary | ICD-10-CM | POA: Diagnosis not present

## 2017-02-25 DIAGNOSIS — M5416 Radiculopathy, lumbar region: Secondary | ICD-10-CM | POA: Diagnosis not present

## 2017-02-25 NOTE — Patient Instructions (Signed)
Follow up as needed. PRN order placed for repeat lumbar ESI should symptoms return

## 2017-02-25 NOTE — Progress Notes (Signed)
Safety precautions to be maintained throughout the outpatient stay will include: orient to surroundings, keep bed in low position, maintain call bell within reach at all times, provide assistance with transfer out of bed and ambulation.  

## 2017-02-25 NOTE — Progress Notes (Signed)
Patient's Name: Manuel [name]  MRN: [id number]  Referring Provider: No ref. provider found  DOB: 10/16/1945  PCP: Default, Provider, MD  DOS: 02/25/2017  Note by:  , MD  Service setting: Ambulatory outpatient  Specialty: Interventional Pain Management  Location: [facility] (AMB) Pain Management Facility    Patient type: Established   Primary Reason(s) for Visit: Encounter for post-procedure evaluation of chronic illness with mild to moderate exacerbation CC: Back Pain (lower bilateral)  HPI  [name] is a 71 y.o. year old, male patient, who comes today for a post-procedure evaluation. He has Lumbar radiculopathy; Lumbar degenerative disc disease; Lumbar spondylosis; and Lumbar facet arthropathy on their problem list. His primarily concern today is the Back Pain (lower bilateral)  Pain Assessment: Location: Lower, Right Back Radiating: down both legs to approx the ankles   Onset:   Duration: Chronic pain Quality: Aching Severity: 0-No pain/10 (self-reported pain score)  Note: Reported level is compatible with observation.                         When using our objective Pain Scale, levels between 6 and 10/10 are said to belong in an emergency room, as it progressively worsens from a 6/10, described as severely limiting, requiring emergency care not usually available at an outpatient pain management facility. At a 6/10 level, communication becomes difficult and requires great effort. Assistance to reach the emergency department may be required. Facial flushing and profuse sweating along with potentially dangerous increases in heart rate and blood pressure will be evident. Effect on ADL: difficulty sleeping because of the pain in back Timing: Intermittent Modifying factors: getting up and moving makes it better  [name] comes in today for post-procedure evaluation after the treatment done on 01/27/2017.  Further details on both, my assessment(s), as well as the proposed treatment  plan, please see below.  Post-Procedure Assessment  01/27/2017 Procedure: Left L5-S1 ESI #1 Pre-procedure pain score:  3/10 Post-procedure pain score: 0/10         Influential Factors: BMI: 27.52 kg/m Intra-procedural challenges: None observed.         Assessment challenges: None detected.              Reported side-effects: None.        Post-procedural adverse reactions or complications: None reported         Sedation: Please see nurses note. When no sedatives are used, the analgesic levels obtained are directly associated to the effectiveness of the local anesthetics. However, when sedation is provided, the level of analgesia obtained during the initial 1 hour following the intervention, is believed to be the result of a combination of factors. These factors may include, but are not limited to: 1. The effectiveness of the local anesthetics used. 2. The effects of the analgesic(s) and/or anxiolytic(s) used. 3. The degree of discomfort experienced by the patient at the time of the procedure. 4. The patients ability and reliability in recalling and recording the events. 5. The presence and influence of possible secondary gains and/or psychosocial factors. Reported result: Relief experienced during the 1st hour after the procedure: 50 % (Ultra-Short Term Relief)            Interpretative annotation: Clinically appropriate result. Analgesia during this period is likely to be Local Anesthetic and/or IV Sedative (Analgesic/Anxiolytic) related.          Effects of local anesthetic: The analgesic effects attained during this period are directly associated to   the localized infiltration of local anesthetics and therefore cary significant diagnostic value as to the etiological location, or anatomical origin, of the pain. Expected duration of relief is directly dependent on the pharmacodynamics of the local anesthetic used. Long-acting (4-6 hours) anesthetics used.  Reported result: Relief during the  next 4 to 6 hour after the procedure: 50 % (Short-Term Relief)            Interpretative annotation: Clinically appropriate result. Analgesia during this period is likely to be Local Anesthetic-related.          Long-term benefit: Defined as the period of time past the expected duration of local anesthetics (1 hour for short-acting and 4-6 hours for long-acting). With the possible exception of prolonged sympathetic blockade from the local anesthetics, benefits during this period are typically attributed to, or associated with, other factors such as analgesic sensory neuropraxia, antiinflammatory effects, or beneficial biochemical changes provided by agents other than the local anesthetics.  Reported result: Extended relief following procedure: 90 %(states that pain is completely gone wtih exception of when he sleeps and after 6 hours of laying down he has to get up because the pain is too much ) (Long-Term Relief)            Interpretative annotation: Clinically appropriate result. Good relief. Therapeutic success. Inflammation plays a part in the etiology to the pain.          Current benefits: Defined as reported results that persistent at this point in time.   Analgesia: 75-100 % [name] reports that both, extremity and the axial pain improved with the treatment. Function: [name] reports improvement in function ROM: [name] reports improvement in ROM Interpretative annotation: Ongoing benefit. Therapeutic benefit observed. Effective therapeutic approach.          Interpretation: Results would suggest a successful diagnostic intervention.                  Plan:  Set up procedure as a PRN palliative treatment option for this patient.        Laboratory Chemistry  Inflammation Markers (CRP: Acute Phase) (ESR: Chronic Phase) No results found for: CRP, ESRSEDRATE               Renal Function Markers Lab Results  Component Value Date   BUN 10 02/16/2012   CREATININE 0.72 02/16/2012    GFRAA >60 02/16/2012   GFRNONAA >60 02/16/2012                 Hepatic Function Markers Lab Results  Component Value Date   AST 52 (H) 02/16/2012   ALT 101 (H) 02/16/2012   ALBUMIN 4.4 02/16/2012   ALKPHOS 77 02/16/2012                 Electrolytes Lab Results  Component Value Date   NA 137 02/16/2012   K 3.8 02/16/2012   CL 102 02/16/2012   CALCIUM 9.6 02/16/2012                 Neuropathy Markers No results found for: VITAMINB12               Bone Pathology Markers Lab Results  Component Value Date   ALKPHOS 77 02/16/2012   CALCIUM 9.6 02/16/2012                 Coagulation Parameters Lab Results  Component Value Date   PLT 176 02/16/2012                   Cardiovascular Markers Lab Results  Component Value Date   HGB 15.7 02/16/2012   HCT 44.4 02/16/2012                 Note: Lab results reviewed.  Recent Diagnostic Imaging Results  DG C-Arm 1-60 Min-No Report Fluoroscopy was utilized by the requesting physician.  No radiographic  interpretation.   Complexity Note: Imaging results reviewed. Results shared with Mr. Ditter, using Layman's terms.                         Meds   Current Outpatient Medications:  .  amLODipine (NORVASC) 5 MG tablet, Take 1 tablet by mouth daily., Disp: , Rfl:  .  Ibuprofen 200 MG CAPS, Take 2 tablets by mouth 3 times/day as needed-between meals & bedtime., Disp: , Rfl:  .  insulin aspart protamine- aspart (NOVOLOG MIX 70/30) (70-30) 100 UNIT/ML injection, Inject 20 Units into the skin daily with breakfast., Disp: , Rfl:  .  lisinopril (PRINIVIL,ZESTRIL) 20 MG tablet, Take 1 tablet by mouth daily., Disp: , Rfl:  .  pravastatin (PRAVACHOL) 40 MG tablet, Take 1 tablet by mouth at bedtime., Disp: , Rfl:  .  amLODipine (NORVASC) 5 MG tablet, Take 1 tablet by mouth daily., Disp: , Rfl:  .  carvedilol (COREG) 12.5 MG tablet, Take 1 tablet by mouth 2 (two) times daily., Disp: , Rfl:   ROS  Constitutional: Denies any fever or  chills Gastrointestinal: No reported hemesis, hematochezia, vomiting, or acute GI distress Musculoskeletal: Denies any acute onset joint swelling, redness, loss of ROM, or weakness Neurological: No reported episodes of acute onset apraxia, aphasia, dysarthria, agnosia, amnesia, paralysis, loss of coordination, or loss of consciousness  Allergies  Mr. Posthumus has No Known Allergies.  PFSH  Drug: Mr. Tout  reports that he does not use drugs. Alcohol:  reports that he drinks alcohol. Tobacco:  reports that  has never smoked. he has never used smokeless tobacco. Medical:  has a past medical history of Arthritis, Diabetes mellitus without complication (HCC), Hyperlipidemia, Hypertension, and Myocardial infarction (HCC) (2008). Surgical: Mr. Walz  has a past surgical history that includes Tonsillectomy. Family: family history includes Heart disease in his father; Scoliosis in his mother.  Constitutional Exam  General appearance: Well nourished, well developed, and well hydrated. In no apparent acute distress Vitals:   02/25/17 1212  BP: (!) 174/85  Pulse: 78  Resp: 16  Temp: 98.2 F (36.8 C)  TempSrc: Oral  SpO2: 93%  Weight: 181 lb (82.1 kg)  Height: 5' 8" (1.727 m)   BMI Assessment: Estimated body mass index is 27.52 kg/m as calculated from the following:   Height as of this encounter: 5' 8" (1.727 m).   Weight as of this encounter: 181 lb (82.1 kg).  BMI interpretation table: BMI level Category Range association with higher incidence of chronic pain  <18 kg/m2 Underweight   18.5-24.9 kg/m2 Ideal body weight   25-29.9 kg/m2 Overweight Increased incidence by 20%  30-34.9 kg/m2 Obese (Class I) Increased incidence by 68%  35-39.9 kg/m2 Severe obesity (Class II) Increased incidence by 136%  >40 kg/m2 Extreme obesity (Class III) Increased incidence by 254%   BMI Readings from Last 4 Encounters:  02/25/17 27.52 kg/m  01/27/17 28.89 kg/m   Wt Readings from Last 4  Encounters:  02/25/17 181 lb (82.1 kg)  01/27/17 190 lb (86.2 kg)  Psych/Mental status: Alert, oriented x 3 (person, place, & time)         Eyes: [name] Respiratory: No evidence of acute respiratory distress  Cervical Spine Area Exam  Skin & Axial Inspection: No masses, redness, edema, swelling, or associated skin lesions Alignment: Symmetrical Functional ROM: Unrestricted ROM      Stability: No instability detected Muscle Tone/Strength: Functionally intact. No obvious neuro-muscular anomalies detected. Sensory (Neurological): Unimpaired Palpation: No palpable anomalies              Upper Extremity (UE) Exam    Side: Right upper extremity  Side: Left upper extremity  Skin & Extremity Inspection: Skin color, temperature, and hair growth are WNL. No peripheral edema or cyanosis. No masses, redness, swelling, asymmetry, or associated skin lesions. No contractures.  Skin & Extremity Inspection: Skin color, temperature, and hair growth are WNL. No peripheral edema or cyanosis. No masses, redness, swelling, asymmetry, or associated skin lesions. No contractures.  Functional ROM: Unrestricted ROM          Functional ROM: Unrestricted ROM          Muscle Tone/Strength: Functionally intact. No obvious neuro-muscular anomalies detected.  Muscle Tone/Strength: Functionally intact. No obvious neuro-muscular anomalies detected.  Sensory (Neurological): Unimpaired          Sensory (Neurological): Unimpaired          Palpation: No palpable anomalies              Palpation: No palpable anomalies              Specialized Test(s): Deferred         Specialized Test(s): Deferred          Thoracic Spine Area Exam  Skin & Axial Inspection: No masses, redness, or swelling Alignment: Symmetrical Functional ROM: Unrestricted ROM Stability: No instability detected Muscle Tone/Strength: Functionally intact. No obvious neuro-muscular anomalies detected. Sensory (Neurological): Unimpaired Muscle strength & Tone: No  palpable anomalies  Lumbar Spine Area Exam  Skin & Axial Inspection: No masses, redness, or swelling Alignment: Symmetrical Functional ROM: Improved after treatment      Stability: No instability detected Muscle Tone/Strength: Functionally intact. No obvious neuro-muscular anomalies detected. Sensory (Neurological): Unimpaired Palpation: No palpable anomalies       Provocative Tests: Lumbar Hyperextension and rotation test: Improved after treatment       Lumbar Lateral bending test: Improved after treatment       Patrick's Maneuver: evaluation deferred today                    Gait & Posture Assessment  Ambulation: Unassisted Gait: Relatively normal for age and body habitus Posture: WNL   Lower Extremity Exam    Side: Right lower extremity  Side: Left lower extremity  Skin & Extremity Inspection: Skin color, temperature, and hair growth are WNL. No peripheral edema or cyanosis. No masses, redness, swelling, asymmetry, or associated skin lesions. No contractures.  Skin & Extremity Inspection: Skin color, temperature, and hair growth are WNL. No peripheral edema or cyanosis. No masses, redness, swelling, asymmetry, or associated skin lesions. No contractures.  Functional ROM: Unrestricted ROM          Functional ROM: Unrestricted ROM          Muscle Tone/Strength: Functionally intact. No obvious neuro-muscular anomalies detected.  Muscle Tone/Strength: Functionally intact. No obvious neuro-muscular anomalies detected.  Sensory (Neurological): Unimpaired  Sensory (Neurological): Unimpaired  Palpation: No palpable anomalies  Palpation: No palpable anomalies   Assessment  Primary Diagnosis & Pertinent Problem List: The primary encounter diagnosis was Lumbar radiculopathy. Diagnoses of Lumbar   degenerative disc disease, Lumbar spondylosis, and Lumbar facet arthropathy were also pertinent to this visit.  Status Diagnosis  Improved Controlled Controlled 1. Lumbar radiculopathy   2. Lumbar  degenerative disc disease   3. Lumbar spondylosis   4. Lumbar facet arthropathy      71 year old male with a history of lumbosacral radiculopathy most pronounced on the left who presents for follow-up status post left L5-S1 epidural steroid injection performed on January 27, 2017 with greater than 80% improvement in his axial and radicular pain symptoms.  Patient notes improvement in his ability to ambulate, perform activities of daily living, sleep.  At this point I am very pleased with the patient's results and his progress.  Should his symptoms recur, I have placed a as needed order for a repeat lumbar epidural steroid injection which the patient can call the clinic for to get scheduled.   Provider-requested follow-up: Return if symptoms worsen or fail to improve.  No future appointments.  Primary Care Physician: Default, Provider, MD Location: [facility] Note by: Gillis Santa, M.D Date: 02/25/2017; Time: 3:20 PM  Patient Instructions  Follow up as needed. PRN order placed for repeat lumbar ESI should symptoms return

## 2017-09-25 DIAGNOSIS — E1142 Type 2 diabetes mellitus with diabetic polyneuropathy: Secondary | ICD-10-CM | POA: Insufficient documentation

## 2017-12-30 DIAGNOSIS — F339 Major depressive disorder, recurrent, unspecified: Secondary | ICD-10-CM | POA: Insufficient documentation

## 2017-12-30 DIAGNOSIS — F32A Depression, unspecified: Secondary | ICD-10-CM | POA: Insufficient documentation

## 2018-12-03 ENCOUNTER — Encounter: Payer: Self-pay | Admitting: Anesthesiology

## 2018-12-04 ENCOUNTER — Other Ambulatory Visit: Payer: Self-pay

## 2018-12-04 ENCOUNTER — Other Ambulatory Visit
Admission: RE | Admit: 2018-12-04 | Discharge: 2018-12-04 | Disposition: A | Discharge status: Home / Self Care | Payer: Managed Care, Other (non HMO) | Source: Ambulatory Visit | Attending: Ophthalmology | Admitting: Ophthalmology

## 2018-12-04 DIAGNOSIS — Z01812 Encounter for preprocedural laboratory examination: Secondary | ICD-10-CM | POA: Diagnosis present

## 2018-12-04 DIAGNOSIS — Z20828 Contact with and (suspected) exposure to other viral communicable diseases: Secondary | ICD-10-CM | POA: Insufficient documentation

## 2018-12-05 LAB — SARS CORONAVIRUS 2 (TAT 6-24 HRS): SARS Coronavirus 2: NEGATIVE

## 2018-12-07 NOTE — Discharge Instructions (Signed)

## 2018-12-09 ENCOUNTER — Emergency Department
Admission: EM | Admit: 2018-12-09 | Discharge: 2018-12-09 | Disposition: A | Discharge status: Home / Self Care | Payer: Managed Care, Other (non HMO) | Source: Home / Self Care | Attending: Emergency Medicine | Admitting: Emergency Medicine

## 2018-12-09 ENCOUNTER — Encounter: Payer: Self-pay | Admitting: Emergency Medicine

## 2018-12-09 ENCOUNTER — Other Ambulatory Visit: Payer: Self-pay

## 2018-12-09 ENCOUNTER — Encounter
Admission: RE | Disposition: A | Discharge status: Other Acute Inpatient Hospital | Payer: Self-pay | Source: Ambulatory Visit | Attending: Ophthalmology

## 2018-12-09 ENCOUNTER — Ambulatory Visit
Admission: RE | Admit: 2018-12-09 | Discharge: 2018-12-09 | Disposition: A | Discharge status: Other Acute Inpatient Hospital | Payer: Managed Care, Other (non HMO) | Source: Ambulatory Visit | Attending: Ophthalmology | Admitting: Ophthalmology

## 2018-12-09 DIAGNOSIS — R55 Syncope and collapse: Secondary | ICD-10-CM | POA: Insufficient documentation

## 2018-12-09 DIAGNOSIS — G4730 Sleep apnea, unspecified: Secondary | ICD-10-CM | POA: Diagnosis not present

## 2018-12-09 DIAGNOSIS — Z5309 Procedure and treatment not carried out because of other contraindication: Secondary | ICD-10-CM | POA: Diagnosis not present

## 2018-12-09 DIAGNOSIS — E785 Hyperlipidemia, unspecified: Secondary | ICD-10-CM | POA: Insufficient documentation

## 2018-12-09 DIAGNOSIS — E119 Type 2 diabetes mellitus without complications: Secondary | ICD-10-CM | POA: Insufficient documentation

## 2018-12-09 DIAGNOSIS — Z79899 Other long term (current) drug therapy: Secondary | ICD-10-CM | POA: Insufficient documentation

## 2018-12-09 DIAGNOSIS — I252 Old myocardial infarction: Secondary | ICD-10-CM | POA: Insufficient documentation

## 2018-12-09 DIAGNOSIS — E1140 Type 2 diabetes mellitus with diabetic neuropathy, unspecified: Secondary | ICD-10-CM | POA: Diagnosis not present

## 2018-12-09 DIAGNOSIS — Z794 Long term (current) use of insulin: Secondary | ICD-10-CM | POA: Insufficient documentation

## 2018-12-09 DIAGNOSIS — Z955 Presence of coronary angioplasty implant and graft: Secondary | ICD-10-CM | POA: Insufficient documentation

## 2018-12-09 DIAGNOSIS — I10 Essential (primary) hypertension: Secondary | ICD-10-CM | POA: Insufficient documentation

## 2018-12-09 DIAGNOSIS — E1136 Type 2 diabetes mellitus with diabetic cataract: Secondary | ICD-10-CM | POA: Diagnosis not present

## 2018-12-09 DIAGNOSIS — H269 Unspecified cataract: Secondary | ICD-10-CM | POA: Insufficient documentation

## 2018-12-09 DIAGNOSIS — M1990 Unspecified osteoarthritis, unspecified site: Secondary | ICD-10-CM | POA: Diagnosis not present

## 2018-12-09 HISTORY — DX: Presence of coronary angioplasty implant and graft: Z95.5

## 2018-12-09 HISTORY — DX: Sleep apnea, unspecified: G47.30

## 2018-12-09 HISTORY — DX: Polyneuropathy, unspecified: G62.9

## 2018-12-09 HISTORY — DX: Unspecified hearing loss, unspecified ear: H91.90

## 2018-12-09 HISTORY — DX: Allergy, unspecified, initial encounter: T78.40XA

## 2018-12-09 LAB — CBC WITH DIFFERENTIAL/PLATELET
Abs Immature Granulocytes: 0.04 10*3/uL (ref 0.00–0.07)
Basophils Absolute: 0 10*3/uL (ref 0.0–0.1)
Basophils Relative: 1 %
Eosinophils Absolute: 0.2 10*3/uL (ref 0.0–0.5)
Eosinophils Relative: 3 %
HCT: 42.7 % (ref 39.0–52.0)
Hemoglobin: 15.5 g/dL (ref 13.0–17.0)
Immature Granulocytes: 1 %
Lymphocytes Relative: 29 %
Lymphs Abs: 2.3 10*3/uL (ref 0.7–4.0)
MCH: 31.2 pg (ref 26.0–34.0)
MCHC: 36.3 g/dL — ABNORMAL HIGH (ref 30.0–36.0)
MCV: 85.9 fL (ref 80.0–100.0)
Monocytes Absolute: 0.8 10*3/uL (ref 0.1–1.0)
Monocytes Relative: 10 %
Neutro Abs: 4.7 10*3/uL (ref 1.7–7.7)
Neutrophils Relative %: 56 %
Platelets: 198 10*3/uL (ref 150–400)
RBC: 4.97 MIL/uL (ref 4.22–5.81)
RDW: 12.2 % (ref 11.5–15.5)
WBC: 8.1 10*3/uL (ref 4.0–10.5)
nRBC: 0 % (ref 0.0–0.2)

## 2018-12-09 LAB — COMPREHENSIVE METABOLIC PANEL
ALT: 71 U/L — ABNORMAL HIGH (ref 0–44)
AST: 53 U/L — ABNORMAL HIGH (ref 15–41)
Albumin: 3.9 g/dL (ref 3.5–5.0)
Alkaline Phosphatase: 59 U/L (ref 38–126)
Anion gap: 10 (ref 5–15)
BUN: 15 mg/dL (ref 8–23)
CO2: 25 mmol/L (ref 22–32)
Calcium: 9.4 mg/dL (ref 8.9–10.3)
Chloride: 100 mmol/L (ref 98–111)
Creatinine, Ser: 0.68 mg/dL (ref 0.61–1.24)
GFR calc Af Amer: >60 mL/min (ref >60)
GFR calc non Af Amer: >60 mL/min (ref >60)
Glucose, Bld: 245 mg/dL — ABNORMAL HIGH (ref 70–99)
Potassium: 3.9 mmol/L (ref 3.5–5.1)
Sodium: 135 mmol/L (ref 135–145)
Total Bilirubin: 1.2 mg/dL (ref 0.3–1.2)
Total Protein: 6.6 g/dL (ref 6.5–8.1)

## 2018-12-09 LAB — TROPONIN I (HIGH SENSITIVITY)
Troponin I (High Sensitivity): 7 ng/L (ref <18)
Troponin I (High Sensitivity): 5 ng/L (ref <18)

## 2018-12-09 LAB — GLUCOSE, CAPILLARY
Glucose-Capillary: 312 mg/dL — ABNORMAL HIGH (ref 70–99)
Glucose-Capillary: 379 mg/dL — ABNORMAL HIGH (ref 70–99)
Glucose-Capillary: 230 mg/dL — ABNORMAL HIGH (ref 70–99)
Glucose-Capillary: 279 mg/dL — ABNORMAL HIGH (ref 70–99)

## 2018-12-09 SURGERY — PHACOEMULSIFICATION, CATARACT, WITH IOL INSERTION
Anesthesia: Topical

## 2018-12-09 MED ORDER — ARMC OPHTHALMIC DILATING DROPS: 1.0000 "application " | OPHTHALMIC | Status: DC | PRN

## 2018-12-09 MED ORDER — SODIUM CHLORIDE 0.9 % IV SOLN
INTRAVENOUS | Status: DC
  Administered 2018-12-09: 09:00:00 via INTRAVENOUS

## 2018-12-09 MED ORDER — TETRACAINE HCL 0.5 % OP SOLN
1.0000 [drp] | OPHTHALMIC | Status: DC | PRN
  Administered 2018-12-09: 1 [drp] via OPHTHALMIC

## 2018-12-09 MED ORDER — MOXIFLOXACIN HCL 0.5 % OP SOLN: 1.0000 [drp] | OPHTHALMIC | Status: DC | PRN

## 2018-12-09 MED ORDER — LACTATED RINGERS IV SOLN: INTRAVENOUS | Status: DC

## 2018-12-09 MED ORDER — INSULIN ASPART 100 UNIT/ML ~~LOC~~ SOLN
4.0000 [IU] | Freq: Once | SUBCUTANEOUS | Status: AC
  Administered 2018-12-09: 08:00:00 4 [IU] via INTRAVENOUS

## 2018-12-09 SURGICAL SUPPLY — 25 items
CANNULA ANT/CHMB 27G (MISCELLANEOUS) ×1 IMPLANT
CANNULA ANT/CHMB 27GA (MISCELLANEOUS) ×3 IMPLANT
GLOVE SURG LX 7.5 STRW (GLOVE) ×2
GLOVE SURG LX STRL 7.5 STRW (GLOVE) ×2 IMPLANT
GLOVE SURG TRIUMPH 8.0 PF LTX (GLOVE) ×4 IMPLANT
GOWN STRL REUS W/ TWL LRG LVL3 (GOWN DISPOSABLE) ×4 IMPLANT
GOWN STRL REUS W/TWL LRG LVL3 (GOWN DISPOSABLE) ×6
MARKER SKIN DUAL TIP RULER LAB (MISCELLANEOUS) ×4 IMPLANT
NDL FILTER BLUNT 18X1 1/2 (NEEDLE) IMPLANT
NDL RETROBULBAR .5 NSTRL (NEEDLE) IMPLANT
NEEDLE FILTER BLUNT 18X 1/2SAF (NEEDLE)
NEEDLE FILTER BLUNT 18X1 1/2 (NEEDLE) IMPLANT
PACK CATARACT BRASINGTON (MISCELLANEOUS) ×4 IMPLANT
PACK EYE AFTER SURG (MISCELLANEOUS) ×4 IMPLANT
PACK OPTHALMIC (MISCELLANEOUS) ×4 IMPLANT
RING MALYGIN 7.0 (MISCELLANEOUS) IMPLANT
SUT ETHILON 10-0 CS-B-6CS-B-6 (SUTURE)
SUT VICRYL  9 0 (SUTURE)
SUT VICRYL 9 0 (SUTURE) IMPLANT
SUTURE EHLN 10-0 CS-B-6CS-B-6 (SUTURE) IMPLANT
SYR 3ML LL SCALE MARK (SYRINGE) ×4 IMPLANT
SYR 5ML LL (SYRINGE) IMPLANT
SYR TB 1ML LUER SLIP (SYRINGE) ×4 IMPLANT
WATER STERILE IRR 500ML POUR (IV SOLUTION) ×4 IMPLANT
WIPE NON LINTING 3.25X3.25 (MISCELLANEOUS) ×4 IMPLANT

## 2018-12-09 NOTE — ED Notes (Signed)
Repeat trop drawn and sent

## 2018-12-09 NOTE — Anesthesia Preprocedure Evaluation (Deleted)

## 2018-12-09 NOTE — Progress Notes (Signed)
Patient surgery canceled per anesthesia and transferred to [facility].

## 2018-12-09 NOTE — Progress Notes (Signed)
Pt presented today for cataract surgery with BS=312.  Reported this was normal for him, and that he had not been taking his oral antihyperglycemics, but had taken insulin last night.  Shortly after beginning interview, pt became diaphoretic, lightheaded, and nauseated.  BS recheck was 372.  Pt was placed on monitors, and vitals remained stable.  IV started and pt given 4u regular insulin IV, with saline bolus. [facility] ED notified, and transport contacted.  Will continue to stabilize, recheck BS in 10 min, and treat while we await transport.

## 2018-12-09 NOTE — ED Provider Notes (Signed)
[facility] Emergency Department Provider Note  ____________________________________________   I have reviewed the triage vital signs and the nursing notes.   HISTORY  Chief Complaint Near syncopal episode  History limited by: Not Limited   HPI Manuel [name] is a 73 y.o. male who presents to the emergency department today from outpatient surgery center because of concerns for a near syncopal episode.  Patient was to have cataract surgery on his right eye today.  He states he had not been feeling very well this morning.  Attributes it to not sleeping well, some alcohol use yesterday and not eating.  Patient states that while he was getting ready for his surgery he felt a sudden episode of some lightheadedness.  Also started feeling dizzy.  Started sweating profusely.  States that he had his blood pressure checked there and it was noted to be low.  Blood sugar is also high although it states it does tend to run high because of the alcohol use.   Records reviewed. Per medical record review patient has a history of DM, HTN, HLD, MI.  Past Medical History:  Diagnosis Date  . Allergies   . Arthritis   . Diabetes mellitus without complication ([place])   . Hard of hearing   . Hyperlipidemia   . Hypertension   . Myocardial infarction [facility]) 2008   treated here at [facility] and transferred to [place] for stent placement  . Neuropathy    knees down  . Sleep apnea    "in the past"  . Status post primary angioplasty with coronary stent     Patient Active Problem List   Diagnosis Date Noted  . Lumbar radiculopathy 01/27/2017  . Lumbar degenerative disc disease 01/27/2017  . Lumbar spondylosis 01/27/2017  . Lumbar facet arthropathy 01/27/2017    Past Surgical History:  Procedure Laterality Date  . TONSILLECTOMY     had removed as a child    Prior to Admission medications   Medication Sig Start Date End Date Taking? Authorizing Provider  amLODipine (NORVASC) 5 MG  tablet Take 10 mg by mouth daily.  12/30/16   [provider]  carvedilol (COREG) 12.5 MG tablet Take 1 tablet by mouth 2 (two) times daily. 12/27/16   [provider]  DULoxetine (CYMBALTA) 30 MG capsule Take 30 mg by mouth daily.    [provider]  fluticasone (FLONASE) 50 MCG/ACT nasal spray Place 2 sprays into both nostrils as needed for allergies or rhinitis.    [provider]  gabapentin (NEURONTIN) 100 MG capsule Take 100 mg by mouth at bedtime.    [provider]  Ibuprofen 200 MG CAPS Take 2 tablets by mouth 3 times/day as needed-between meals & bedtime.    [provider]  insulin aspart protamine- aspart (NOVOLOG MIX 70/30) (70-30) 100 UNIT/ML injection Inject 25 Units into the skin daily with breakfast.     [provider]  lisinopril (PRINIVIL,ZESTRIL) 20 MG tablet Take 1 tablet by mouth daily. 08/12/16 12/09/18  [provider]  metFORMIN (GLUCOPHAGE) 500 MG tablet Take by mouth daily.    [provider]  pravastatin (PRAVACHOL) 40 MG tablet Take 1 tablet by mouth at bedtime. 05/10/16 12/09/18  [provider]    Allergies Patient has no known allergies.  Family History  Problem Relation Age of Onset  . Scoliosis Mother   . Heart disease Father     Social History Social History   Tobacco Use  . Smoking status: Never  Smoker  . Smokeless tobacco: Never Used  Substance Use Topics  . Alcohol use: Yes    Alcohol/week: 3.0 - 4.0 standard drinks    Types: 3 - 4 Cans of beer per week    Comment: occassional  . Drug use: No    Review of Systems Constitutional: No fever/chills Eyes: No visual changes. ENT: No sore throat. Cardiovascular: Denies chest pain. Respiratory: Denies shortness of breath. Gastrointestinal: No abdominal pain.  No nausea, no vomiting.  No diarrhea.   Genitourinary: Negative for dysuria. Musculoskeletal: Negative for back pain. Skin: Negative for  rash. Neurological: Positive for lightheadedness. ____________________________________________   PHYSICAL EXAM:  VITAL SIGNS: ED Triage Vitals  Enc Vitals Group     BP 12/09/18 0923 121/70     Pulse Rate 12/09/18 0923 67     Resp 12/09/18 0923 17     Temp 12/09/18 0924 97.8 F (36.6 C)     Temp Source 12/09/18 0924 Oral     SpO2 12/09/18 0923 99 %     Weight 12/09/18 0923 190 lb (86.2 kg)     Height 12/09/18 0923 5\' 8"  (1.727 m)     Head Circumference --      Peak Flow --      Pain Score 12/09/18 0923 0   Constitutional: Alert and oriented.  Eyes: Conjunctivae are normal.  ENT      Head: Normocephalic and atraumatic.      Nose: No congestion/rhinnorhea.      Mouth/Throat: Mucous membranes are moist.      Neck: No stridor. Hematological/Lymphatic/Immunilogical: No cervical lymphadenopathy. Cardiovascular: Normal rate, regular rhythm.  No murmurs, rubs, or gallops.  Respiratory: Normal respiratory effort without tachypnea nor retractions. Breath sounds are clear and equal bilaterally. No wheezes/rales/rhonchi. Gastrointestinal: Soft and non tender. No rebound. No guarding.  Genitourinary: Deferred Musculoskeletal: Normal range of motion in all extremities. No lower extremity edema. Neurologic:  Normal speech and language. No gross focal neurologic deficits are appreciated.  Skin:  Skin is warm, dry and intact. No rash noted. Psychiatric: Mood and affect are normal. Speech and behavior are normal. Patient exhibits appropriate insight and judgment.  ____________________________________________    LABS (pertinent positives/negatives)  Trop hs 7 -> 5 CBC wbc 8.1, hgb 15.5, plt 198 CMP wnl except glu 245, ast 53, alt 71  ____________________________________________   EKG  I, Phineas SemenGraydon Kyleigh Nannini, attending physician, personally viewed and interpreted this EKG  EKG Time: 0928 Rate: 66 Rhythm: sinus rhythm Axis: normal Intervals: qtc 448 QRS: q waves II, III aVF ST  changes: no st elevation Impression: abnormal ekg   ____________________________________________    RADIOLOGY  None  ____________________________________________   PROCEDURES  Procedures  ____________________________________________   INITIAL IMPRESSION / ASSESSMENT AND PLAN / ED COURSE  Pertinent labs & imaging results that were available during my care of the patient were reviewed by me and considered in my medical decision making (see chart for details).   Patient presented to the emergency department today after a near syncopal episode while he was getting prepared to have cataract surgery.  On exam here patient is well-appearing.  Blood work without any concerning anemia, electrolyte abnormality and troponin was negative x2.  This point do wonder if patient had a vasovagal episode.  He states he did not sleep well last night, was prepared for surgery and so had not eaten.  Given that the patient appears well and no concerning findings at work do think is appropriate for patient be discharged home.  Discussed findings of the house with patient.   ____________________________________________   FINAL CLINICAL IMPRESSION(S) / ED DIAGNOSES  Final diagnoses:  Near syncope     Note: This dictation was prepared with Dragon dictation. Any transcriptional errors that result from this process are unintentional     Phineas SemenGoodman, Chany Woolworth, MD 12/09/18 1358

## 2018-12-09 NOTE — H&P (Addendum)
The History and Physical notes are on paper, have been signed, and are to be scanned. The patient remains stable and unchanged from the H&P.   Previous H&P reviewed, patient examined, and there are no changes.  The patient has a visually significant cataract interfering with his or her vision.  I attest that the following are true and accurate to the best of my knowledge: 1. The patient's impairment of visual function is believed not to be correctable with a tolerable change in glasses or contact lenses. 2. Cataract (in the operative eye) is believed to be significantly contributing to the patient's visual impairment. 3. The patient desires surgical correction; the risks, benefits, and alternatives have been explained, and questions have been answered to the patients satisfaction.  A reasonable expectation exists that cataract surgery will significantly improve both the visual and functional status of the patient.   After initial evaluation, the patient became nauseated and diaphoretic with decreased BP and increased blood glucose.  Transfer arranged to ED at [place] 12/09/2018 8:02 AM

## 2018-12-09 NOTE — ED Notes (Signed)
assumed care f patient. Patient reports feeling better, denies pain or sob, dizziness. Vss. Wife at bedside. Patient awaiting disposition.

## 2018-12-09 NOTE — Discharge Instructions (Addendum)
Please seek medical attention for any high fevers, chest pain, shortness of breath, change in behavior, persistent vomiting, bloody stool or any other new or concerning symptoms.  

## 2018-12-09 NOTE — ED Triage Notes (Addendum)
Pt arrives via ems from doctors office. Pt was to have cataract surgery today but states all the sudden he felt "very bad." Office faculty assessed bp with readings in the 90s and CBG readings in the 300s. Pt states "ive been feeling bad for months but I feel better now." Pt's bp 121/70 in triage. Pt received 300 cc of NS prior to arrival. Pt reports intermittent compliance with insulin regimen.

## 2019-10-11 ENCOUNTER — Other Ambulatory Visit: Payer: Self-pay | Admitting: Internal Medicine

## 2019-10-11 ENCOUNTER — Other Ambulatory Visit (HOSPITAL_COMMUNITY): Payer: Self-pay | Admitting: Internal Medicine

## 2019-10-11 DIAGNOSIS — S32010A Wedge compression fracture of first lumbar vertebra, initial encounter for closed fracture: Secondary | ICD-10-CM

## 2019-10-12 ENCOUNTER — Ambulatory Visit
Admission: RE | Admit: 2019-10-12 | Discharge: 2019-10-12 | Disposition: A | Discharge status: Home / Self Care | Payer: Managed Care, Other (non HMO) | Source: Ambulatory Visit | Attending: Internal Medicine | Admitting: Internal Medicine

## 2019-10-12 ENCOUNTER — Other Ambulatory Visit: Payer: Self-pay

## 2019-10-12 DIAGNOSIS — S32010A Wedge compression fracture of first lumbar vertebra, initial encounter for closed fracture: Secondary | ICD-10-CM | POA: Insufficient documentation

## 2020-03-24 ENCOUNTER — Ambulatory Visit: Payer: Medicare Other | Attending: Internal Medicine

## 2020-03-24 ENCOUNTER — Other Ambulatory Visit: Payer: Self-pay | Admitting: Internal Medicine

## 2020-03-24 DIAGNOSIS — Z23 Encounter for immunization: Secondary | ICD-10-CM

## 2020-03-24 NOTE — Progress Notes (Signed)
   Covid-19 Vaccination Clinic  Name:  Mael Delap    MRN: [id number] DOB: Jun 25, 1945  03/24/2020  Mr. Toruno was observed post Covid-19 immunization for 15 minutes without incident. He was provided with Vaccine Information Sheet and instruction to access the V-Safe system.   Mr. Ekholm was instructed to call 911 with any severe reactions post vaccine: Marland Kitchen Difficulty breathing  . Swelling of face and throat  . A fast heartbeat  . A bad rash all over body  . Dizziness and weakness   Immunizations Administered    Name Date Dose VIS Date Route   Pfizer COVID-19 Vaccine 03/24/2020  2:04 PM 0.3 mL 02/09/2020 Intramuscular   Manufacturer: [employer], [employer]   Lot: [id number]   NDC: [address]-1

## 2020-04-22 DIAGNOSIS — Z9841 Cataract extraction status, right eye: Secondary | ICD-10-CM

## 2020-04-22 HISTORY — DX: Cataract extraction status, right eye: Z98.41

## 2020-07-11 ENCOUNTER — Other Ambulatory Visit: Payer: Self-pay

## 2020-07-11 ENCOUNTER — Encounter: Payer: Managed Care, Other (non HMO) | Attending: Physician Assistant | Admitting: Physician Assistant

## 2020-07-11 DIAGNOSIS — F1010 Alcohol abuse, uncomplicated: Secondary | ICD-10-CM | POA: Diagnosis not present

## 2020-07-11 DIAGNOSIS — L97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posed: Secondary | ICD-10-CM | POA: Insufficient documentation

## 2020-07-11 DIAGNOSIS — I10 Essential (primary) hypertension: Secondary | ICD-10-CM | POA: Insufficient documentation

## 2020-07-11 DIAGNOSIS — E11621 Type 2 diabetes mellitus with foot ulcer: Secondary | ICD-10-CM | POA: Diagnosis not present

## 2020-07-11 NOTE — Progress Notes (Signed)
KEIR, VIERNES ([id number]) Visit Report for 07/11/2020 Abuse/Suicide Risk Screen Details Patient Name: Manuel [name], Manuel [name]. Date of Service: 07/11/2020 9:30 AM Medical Record Number: [id number] Patient Account Number: [id number] Date of Birth/Sex: 1945/09/16 (75 y.o. Male) Treating RN: Rogers Blocker Primary Care Pleshette Tomasini: Marcelino Duster Other Clinician: Lolita Cram Referring Malissie Musgrave: Gordy Councilman Treating Lakeem Rozo/Extender: Rowan Blase in Treatment: 0 Abuse/Suicide Risk Screen Items Answer ABUSE RISK SCREEN: Has anyone close to you tried to hurt or harm you recentlyo No Do you feel uncomfortable with anyone in your familyo No Has anyone forced you do things that you didnot want to doo No Electronic Signature(s) Signed: 07/11/2020 3:37:26 PM By: Lolita Cram Signed: 07/11/2020 5:09:07 PM By: Phillis Haggis, Dondra Prader RN Entered By: Lolita Cram on 07/11/2020 09:40:42 Manuel [name] ([id number]) -------------------------------------------------------------------------------- Activities of Daily Living Details Patient Name: Manuel [name]. Date of Service: 07/11/2020 9:30 AM Medical Record Number: [id number] Patient Account Number: [id number] Date of Birth/Sex: 08-30-1945 (75 y.o. Male) Treating RN: Rogers Blocker Primary Care Huxley Vanwagoner: Marcelino Duster Other Clinician: Lolita Cram Referring Mialee Weyman: Gordy Councilman Treating Emmanuella Mirante/Extender: Rowan Blase in Treatment: 0 Activities of Daily Living Items Answer Activities of Daily Living (Please select one for each item) Drive Automobile Completely Able Take Medications Completely Able Use Telephone Completely Able Care for Appearance Completely Able Use Toilet Completely Able Bath / Shower Completely Able Dress Self Completely Able Feed Self Completely Able Walk Completely Able Get In / Out Bed Completely Able Housework Completely Able Prepare Meals Completely Able Handle Money Completely  Able Shop for Self Completely Able Electronic Signature(s) Signed: 07/11/2020 3:37:26 PM By: Lolita Cram Signed: 07/11/2020 5:09:07 PM By: Phillis Haggis, Dondra Prader RN Entered By: Lolita Cram on 07/11/2020 09:41:10 Manuel [name] ([id number]) -------------------------------------------------------------------------------- Education Screening Details Patient Name: Manuel [name]. Date of Service: 07/11/2020 9:30 AM Medical Record Number: [id number] Patient Account Number: [id number] Date of Birth/Sex: Jun 22, 1945 (75 y.o. Male) Treating RN: Rogers Blocker Primary Care Taetum Flewellen: Marcelino Duster Other Clinician: Lolita Cram Referring Quavis Klutz: Gordy Councilman Treating Leauna Sharber/Extender: Rowan Blase in Treatment: 0 Primary Learner Assessed: Patient Learning Preferences/Education Level/Primary Language Learning Preference: Explanation, Demonstration Highest Education Level: College or Above Preferred Language: English Cognitive Barrier Language Barrier: No Translator Needed: No Memory Deficit: No Emotional Barrier: No Cultural/Religious Beliefs Affecting Medical Care: No Physical Barrier Impaired Vision: No Impaired Hearing: No Decreased Hand dexterity: No Knowledge/Comprehension Knowledge Level: High Comprehension Level: High Ability to understand written instructions: High Ability to understand verbal instructions: High Motivation Anxiety Level: Calm Cooperation: Cooperative Education Importance: Acknowledges Need Interest in Health Problems: Asks Questions Perception: Coherent Willingness to Engage in Self-Management High Activities: Readiness to Engage in Self-Management High Activities: Electronic Signature(s) Signed: 07/11/2020 3:37:26 PM By: Lolita Cram Signed: 07/11/2020 5:09:07 PM By: Phillis Haggis, Dondra Prader RN Entered By: Lolita Cram on 07/11/2020 09:41:50 Manuel [name]  ([id number]) -------------------------------------------------------------------------------- Fall Risk Assessment Details Patient Name: Manuel [name]. Date of Service: 07/11/2020 9:30 AM Medical Record Number: [id number] Patient Account Number: [id number] Date of Birth/Sex: 1946-03-29 (75 y.o. Male) Treating RN: Rogers Blocker Primary Care Ryo Klang: Marcelino Duster Other Clinician: Lolita Cram Referring Ciani Rutten: Gordy Councilman Treating Beckham Capistran/Extender: Rowan Blase in Treatment: 0 Fall Risk Assessment Items Have you had 2 or more falls in the last 12 monthso 0 Yes Have you had any fall that resulted in injury in the last 12 monthso 0 No FALLS RISK SCREEN History of falling - immediate or within 3 months 0 No Secondary diagnosis (Do you have 2 or more  medical diagnoseso) 0 No Ambulatory aid None/bed rest/wheelchair/nurse 0 No Crutches/cane/walker 0 No Furniture 0 No Intravenous therapy Access/Saline/Heparin Lock 0 No Gait/Transferring Normal/ bed rest/ wheelchair 0 No Weak (short steps with or without shuffle, stooped but able to lift head while walking, may 0 No seek support from furniture) Impaired (short steps with shuffle, may have difficulty arising from chair, head down, impaired 0 No balance) Mental Status Oriented to own ability 0 No Electronic Signature(s) Signed: 07/11/2020 3:37:26 PM By: Lolita Cram Signed: 07/11/2020 5:09:07 PM By: Phillis Haggis, Dondra Prader RN Entered By: Lolita Cram on 07/11/2020 09:43:14 Manuel [name] ([id number]) -------------------------------------------------------------------------------- Foot Assessment Details Patient Name: Manuel [name]. Date of Service: 07/11/2020 9:30 AM Medical Record Number: [id number] Patient Account Number: [id number] Date of Birth/Sex: December 15, 1945 (74 y.o. Male) Treating RN: Rogers Blocker Primary Care Terry Bolotin: Marcelino Duster Other Clinician: Lolita Cram Referring Jashayla Glatfelter:  Gordy Councilman Treating Lexington Krotz/Extender: Rowan Blase in Treatment: 0 Foot Assessment Items Site Locations + = Sensation present, - = Sensation absent, C = Callus, U = Ulcer R = Redness, W = Warmth, M = Maceration, PU = Pre-ulcerative lesion F = Fissure, S = Swelling, D = Dryness Assessment Right: Left: Other Deformity: No No Prior Foot Ulcer: No No Prior Amputation: No No Charcot Joint: No No Ambulatory Status: Gait: Electronic Signature(s) Signed: 07/11/2020 3:37:26 PM By: Lolita Cram Signed: 07/11/2020 5:09:07 PM By: Phillis Haggis, Dondra Prader RN Entered By: Lolita Cram on 07/11/2020 10:03:10 Manuel [name] ([id number]) -------------------------------------------------------------------------------- Nutrition Risk Screening Details Patient Name: Manuel [name]. Date of Service: 07/11/2020 9:30 AM Medical Record Number: [id number] Patient Account Number: [id number] Date of Birth/Sex: 1945/08/21 (75 y.o. Male) Treating RN: Rogers Blocker Primary Care Selby Slovacek: Marcelino Duster Other Clinician: Lolita Cram Referring Shahana Capes: Gordy Councilman Treating Elaina Cara/Extender: Allen Derry Weeks in Treatment: 0 Height (in): 68 Weight (lbs): 190 Body Mass Index (BMI): 28.9 Nutrition Risk Screening Items Score Screening NUTRITION RISK SCREEN: I have an illness or condition that made me change the kind and/or amount of food I eat 0 No I eat fewer than two meals per day 0 No I eat few fruits and vegetables, or milk products 0 No I have three or more drinks of beer, liquor or wine almost every day 0 No I have tooth or mouth problems that make it hard for me to eat 0 No I don't always have enough money to buy the food I need 0 No I eat alone most of the time 0 No I take three or more different prescribed or over-the-counter drugs a day 1 Yes Without wanting to, I have lost or gained 10 pounds in the last six months 0 No I am not always physically able to shop, cook  and/or feed myself 0 No Nutrition Protocols Good Risk Protocol Moderate Risk Protocol High Risk Proctocol Risk Level: Good Risk Score: 1 Electronic Signature(s) Signed: 07/11/2020 3:37:26 PM By: Lolita Cram Signed: 07/11/2020 5:09:07 PM By: Phillis Haggis, Dondra Prader RN Entered By: Lolita Cram on 07/11/2020 09:45:00

## 2020-07-11 NOTE — Progress Notes (Signed)
NYSHAWN, GOWDY ([id number]) Visit Report for 07/11/2020 Allergy List Details Patient Name: Manuel [name], Manuel [name]. Date of Service: 07/11/2020 9:30 AM Medical Record Number: [id number] Patient Account Number: [id number] Date of Birth/Sex: [birth year] (75 y.o. Male) Treating RN: Dolan Amen Primary Care Evelise Reine: Harrel Lemon Other Clinician: Jeanine Luz Referring Ariston Grandison: Norton Pastel Treating Jenesys Casseus/Extender: Jeri Cos Weeks in Treatment: 0 Allergies Active Allergies No Known Allergies Allergy Notes Electronic Signature(s) Signed: 07/11/2020 3:37:26 PM By: Jeanine Luz Entered By: Jeanine Luz on 07/11/2020 09:34:17 Manuel [name] ([id number]) -------------------------------------------------------------------------------- Arrival Information Details Patient Name: Manuel [name]. Date of Service: 07/11/2020 9:30 AM Medical Record Number: [id number] Patient Account Number: [id number] Date of Birth/Sex: 09/22/1945 (75 y.o. Male) Treating RN: Dolan Amen Primary Care Letanya Froh: Harrel Lemon Other Clinician: Jeanine Luz Referring Lajada Janes: Norton Pastel Treating Nyron Mozer/Extender: Skipper Cliche in Treatment: 0 Visit Information Patient Arrived: Ambulatory Arrival Time: 09:30 Accompanied By: self Transfer Assistance: None Patient Identification Verified: Yes Secondary Verification Process Completed: Yes Patient Has Alerts: Yes Patient Alerts: Type II Diabetic Electronic Signature(s) Signed: 07/11/2020 3:37:26 PM By: Jeanine Luz Entered By: Jeanine Luz on 07/11/2020 09:33:52 Manuel [name] ([id number]) -------------------------------------------------------------------------------- Clinic Level of Care Assessment Details Patient Name: Manuel [name]. Date of Service: 07/11/2020 9:30 AM Medical Record Number: [id number] Patient Account Number: [id number] Date of Birth/Sex: May 31, 1945 (75 y.o. Male) Treating RN: Dolan Amen Primary Care Aadil Sur: Harrel Lemon Other Clinician: Jeanine Luz Referring Breeona Waid: Norton Pastel Treating Yaphet Smethurst/Extender: Skipper Cliche in Treatment: 0 Clinic Level of Care Assessment Items TOOL 3 Quantity Score X - Use when EandM and Procedure is performed on FOLLOW-UP visit 1 0 ASSESSMENTS - Nursing Assessment / Reassessment X - Reassessment of Co-morbidities (includes updates in patient status) 1 10 X- 1 5 Reassessment of Adherence to Treatment Plan ASSESSMENTS - Wound and Skin Assessment / Reassessment '[]'  - Points for Wound Assessment can only be taken for a new wound of unknown or different etiology and a 0 procedure is NOT performed to that wound X- 1 5 Simple Wound Assessment / Reassessment - one wound '[]'  - 0 Complex Wound Assessment / Reassessment - multiple wounds '[]'  - 0 Dermatologic / Skin Assessment (not related to wound area) ASSESSMENTS - Focused Assessment '[]'  - Circumferential Edema Measurements - multi extremities 0 '[]'  - 0 Nutritional Assessment / Counseling / Intervention '[]'  - 0 Lower Extremity Assessment (monofilament, tuning fork, pulses) '[]'  - 0 Peripheral Arterial Disease Assessment (using hand held doppler) ASSESSMENTS - Ostomy and/or Continence Assessment and Care '[]'  - Incontinence Assessment and Management 0 '[]'  - 0 Ostomy Care Assessment and Management (repouching, etc.) PROCESS - Coordination of Care '[]'  - Points for Discharge Coordination can only be taken for a new wound of unknown or different etiology and a 0 procedure is NOT performed to that wound X- 1 15 Simple Patient / Family Education for ongoing care '[]'  - 0 Complex (extensive) Patient / Family Education for ongoing care '[]'  - 0 Staff obtains Programmer, systems, Records, Test Results / Process Orders '[]'  - 0 Staff telephones HHA, Nursing Homes / Clarify orders / etc '[]'  - 0 Routine Transfer to another Facility (non-emergent condition) '[]'  - 0 Routine Hospital Admission  (non-emergent condition) '[]'  - 0 New Admissions / Biomedical engineer / Ordering NPWT, Apligraf, etc. '[]'  - 0 Emergency Hospital Admission (emergent condition) X- 1 10 Simple Discharge Coordination '[]'  - 0 Complex (extensive) Discharge Coordination PROCESS - Special Needs '[]'  - Pediatric / Minor Patient Management 0 '[]'  - 0 Isolation Patient Management '[]'  -  0 Hearing / Language / Visual special needs '[]'  - 0 Assessment of Community assistance (transportation, D/C planning, etc.) Manuel [name], Manuel [name]. ([id number]) '[]'  - 0 Additional assistance / Altered mentation '[]'  - 0 Support Surface(s) Assessment (bed, cushion, seat, etc.) INTERVENTIONS - Wound Cleansing / Measurement '[]'  - Points for Wound Cleaning / Measurement, Wound Dressing, Specimen Collection and Specimen taken to lab 0 can only be taken for a new wound of unknown or different etiology and a procedure is NOT performed to that wound X- 1 5 Simple Wound Cleansing - one wound '[]'  - 0 Complex Wound Cleansing - multiple wounds X- 1 5 Wound Imaging (photographs - any number of wounds) '[]'  - 0 Wound Tracing (instead of photographs) X- 1 5 Simple Wound Measurement - one wound '[]'  - 0 Complex Wound Measurement - multiple wounds INTERVENTIONS - Wound Dressings '[]'  - Small Wound Dressing one or multiple wounds 0 X- 1 15 Medium Wound Dressing one or multiple wounds '[]'  - 0 Large Wound Dressing one or multiple wounds INTERVENTIONS - Miscellaneous '[]'  - External ear exam 0 '[]'  - 0 Specimen Collection (cultures, biopsies, blood, body fluids, etc.) '[]'  - 0 Specimen(s) / Culture(s) sent or taken to Lab for analysis '[]'  - 0 Patient Transfer (multiple staff / Civil Service fast streamer / Similar devices) '[]'  - 0 Simple Staple / Suture removal (25 or less) '[]'  - 0 Complex Staple / Suture removal (26 or more) '[]'  - 0 Hypo / Hyperglycemic Management (close monitor of Blood Glucose) X- 1 15 Ankle / Brachial Index (ABI) - do not check if billed  separately X- 1 5 Vital Signs Has the patient been seen at the hospital within the last three years: Yes Total Score: 95 Level Of Care: New/Established - Level 3 Electronic Signature(s) Signed: 07/11/2020 5:09:07 PM By: Georges Mouse, Minus Breeding RN Entered By: Georges Mouse, Kenia on 07/11/2020 10:37:34 Manuel [name] ([id number]) -------------------------------------------------------------------------------- Encounter Discharge Information Details Patient Name: Manuel [name]. Date of Service: 07/11/2020 9:30 AM Medical Record Number: [id number] Patient Account Number: [id number] Date of Birth/Sex: August 06, 1945 (75 y.o. Male) Treating RN: Dolan Amen Primary Care Jaamal Farooqui: Harrel Lemon Other Clinician: Jeanine Luz Referring Shuna Tabor: Norton Pastel Treating Zuzu Befort/Extender: Skipper Cliche in Treatment: 0 Encounter Discharge Information Items Post Procedure Vitals Discharge Condition: Stable Temperature (F): 98.2 Ambulatory Status: Ambulatory Pulse (bpm): 82 Discharge Destination: Home Respiratory Rate (breaths/min): 20 Transportation: Private Auto Blood Pressure (mmHg): 170/82 Accompanied By: self Schedule Follow-up Appointment: Yes Clinical Summary of Care: Electronic Signature(s) Signed: 07/11/2020 5:09:07 PM By: Georges Mouse, Minus Breeding RN Entered By: Georges Mouse, Minus Breeding on 07/11/2020 10:38:25 Manuel [name] ([id number]) -------------------------------------------------------------------------------- Lower Extremity Assessment Details Patient Name: Manuel [name]. Date of Service: 07/11/2020 9:30 AM Medical Record Number: [id number] Patient Account Number: [id number] Date of Birth/Sex: [birth year] (74 y.o. Male) Treating RN: Dolan Amen Primary Care Annisa Mazzarella: Harrel Lemon Other Clinician: Jeanine Luz Referring Makyi Ledo: Norton Pastel Treating Tylor Gambrill/Extender: Jeri Cos Weeks in Treatment: 0 Edema Assessment Assessed: [Left:  No] [Right: Yes] Edema: [Left: N] [Right: o] Vascular Assessment Pulses: Dorsalis Pedis Palpable: [Right:Yes] Doppler Audible: [Right:Yes] Posterior Tibial Palpable: [Right:Yes] Doppler Audible: [Right:Yes] Blood Pressure: Brachial: [Right:190] Dorsalis Pedis: 110 Ankle: Posterior Tibial: 160 Ankle Brachial Index: [Right:0.84] Electronic Signature(s) Signed: 07/11/2020 3:37:26 PM By: Jeanine Luz Signed: 07/11/2020 5:09:07 PM By: Georges Mouse, Minus Breeding RN Entered By: Jeanine Luz on 07/11/2020 09:58:50 Manuel [name], Manuel [name] ([id number]) -------------------------------------------------------------------------------- Multi Wound Chart Details Patient Name: Manuel [name]. Date of Service: 07/11/2020 9:30 AM Medical Record Number: [id number] Patient Account Number:  [id number] Date of Birth/Sex: 1945/06/28 (75 y.o. Male) Treating RN: Dolan Amen Primary Care Ihor Meinzer: Harrel Lemon Other Clinician: Jeanine Luz Referring Vera Furniss: Norton Pastel Treating Christobal Morado/Extender: Skipper Cliche in Treatment: 0 Vital Signs Height(in): 40 Pulse(bpm): 59 Weight(lbs): 190 Blood Pressure(mmHg): 170/82 Body Mass Index(BMI): 29 Temperature(F): 98.2 Respiratory Rate(breaths/min): 20 Photos: [N/A:N/A] Wound Location: Right, Plantar Metatarsal head Right Calcaneus N/A fourth Wounding Event: Gradually Appeared Gradually Appeared N/A Primary Etiology: Diabetic Wound/Ulcer of the Lower Diabetic Wound/Ulcer of the Lower N/A Extremity Extremity Comorbid History: Cataracts, Hypertension, Myocardial Cataracts, Hypertension, Myocardial N/A Infarction, Type II Diabetes Infarction, Type II Diabetes Date Acquired: 07/01/2020 07/01/2020 N/A Weeks of Treatment: 0 0 N/A Wound Status: Open Open N/A Measurements L x W x D (cm) 1.6x0.8x0.1 3.6x0.1x0.1 N/A Area (cm) : 1.005 0.283 N/A Volume (cm) : 0.101 0.028 N/A Classification: Unable to visualize wound bed Unable to visualize wound  bed N/A Exudate Amount: None Present None Present N/A Wound Margin: Thickened Thickened N/A Granulation Amount: None Present (0%) None Present (0%) N/A Necrotic Amount: None Present (0%) None Present (0%) N/A Exposed Structures: Fascia: No Fascia: No N/A Fat Layer (Subcutaneous Tissue): Fat Layer (Subcutaneous Tissue): No No Tendon: No Tendon: No Muscle: No Muscle: No Joint: No Joint: No Bone: No Bone: No Epithelialization: None None N/A Treatment Notes Electronic Signature(s) Signed: 07/11/2020 5:09:07 PM By: Georges Mouse, Minus Breeding RN Entered By: Georges Mouse, Minus Breeding on 07/11/2020 10:08:03 Manuel [name] ([id number]) -------------------------------------------------------------------------------- Multi-Disciplinary Care Plan Details Patient Name: Manuel [name]. Date of Service: 07/11/2020 9:30 AM Medical Record Number: [id number] Patient Account Number: [id number] Date of Birth/Sex: 12/11/1945 (75 y.o. Male) Treating RN: Dolan Amen Primary Care Naylah Cork: Harrel Lemon Other Clinician: Jeanine Luz Referring Fynlee Rowlands: Norton Pastel Treating Gavina Dildine/Extender: Skipper Cliche in Treatment: 0 Active Inactive Orientation to the Wound Care Program Nursing Diagnoses: Knowledge deficit related to the wound healing center program Goals: Patient/caregiver will verbalize understanding of the [facility] Program Date Initiated: 07/11/2020 Target Resolution Date: 07/11/2020 Goal Status: Active Interventions: Provide education on orientation to the wound center Notes: Wound/Skin Impairment Nursing Diagnoses: Impaired tissue integrity Goals: Patient/caregiver will verbalize understanding of skin care regimen Date Initiated: 07/11/2020 Target Resolution Date: 07/11/2020 Goal Status: Active Ulcer/skin breakdown will have a volume reduction of 30% by week 4 Date Initiated: 07/11/2020 Target Resolution Date: 08/11/2020 Goal Status: Active Ulcer/skin  breakdown will have a volume reduction of 50% by week 8 Date Initiated: 07/11/2020 Target Resolution Date: 09/10/2020 Goal Status: Active Ulcer/skin breakdown will have a volume reduction of 80% by week 12 Date Initiated: 07/11/2020 Target Resolution Date: 10/11/2020 Goal Status: Active Interventions: Assess patient/caregiver ability to obtain necessary supplies Assess patient/caregiver ability to perform ulcer/skin care regimen upon admission and as needed Assess ulceration(s) every visit Provide education on ulcer and skin care Treatment Activities: Skin care regimen initiated : 07/11/2020 Notes: Electronic Signature(s) Signed: 07/11/2020 5:09:07 PM By: Georges Mouse, Minus Breeding RN Entered By: Georges Mouse, Minus Breeding on 07/11/2020 10:06:21 Manuel [name] ([id number]) -------------------------------------------------------------------------------- Pain Assessment Details Patient Name: Manuel [name]. Date of Service: 07/11/2020 9:30 AM Medical Record Number: [id number] Patient Account Number: [id number] Date of Birth/Sex: 05-May-1945 (74 y.o. Male) Treating RN: Dolan Amen Primary Care Laree Garron: Harrel Lemon Other Clinician: Jeanine Luz Referring Fadil Macmaster: Norton Pastel Treating Lennis Korb/Extender: Skipper Cliche in Treatment: 0 Active Problems Location of Pain Severity and Description of Pain Patient Has Paino No Site Locations Pain Management and Medication Current Pain Management: Electronic Signature(s) Signed: 07/11/2020 3:37:26 PM By: Jeanine Luz Signed: 07/11/2020 5:09:07  PM By: Georges Mouse, Kenia RN Entered By: Jeanine Luz on 07/11/2020 09:32:12 Manuel [name] ([id number]) -------------------------------------------------------------------------------- Patient/Caregiver Education Details Patient Name: Manuel [name]. Date of Service: 07/11/2020 9:30 AM Medical Record Number: [id number] Patient Account Number: [id number] Date of  Birth/Gender: 22-Sep-1945 (74 y.o. Male) Treating RN: Dolan Amen Primary Care Physician: Harrel Lemon Other Clinician: Jeanine Luz Referring Physician: Norton Pastel Treating Physician/Extender: Skipper Cliche in Treatment: 0 Education Assessment Education Provided To: Patient Education Topics Provided Elevated Blood Sugar/ Impact on Healing: Methods: Explain/Verbal Responses: State content correctly Offloading: Methods: Explain/Verbal Responses: State content correctly Wound/Skin Impairment: Methods: Explain/Verbal Responses: State content correctly Electronic Signature(s) Signed: 07/11/2020 5:09:07 PM By: Georges Mouse, Minus Breeding RN Entered By: Georges Mouse, Kenia on 07/11/2020 10:13:36 Manuel [name] ([id number]) -------------------------------------------------------------------------------- Wound Assessment Details Patient Name: Manuel [name]. Date of Service: 07/11/2020 9:30 AM Medical Record Number: [id number] Patient Account Number: [id number] Date of Birth/Sex: [birth year] (75 y.o. Male) Treating RN: Dolan Amen Primary Care Sheily Lineman: Harrel Lemon Other Clinician: Jeanine Luz Referring Rylin Saez: Norton Pastel Treating Ebunoluwa Gernert/Extender: Jeri Cos Weeks in Treatment: 0 Wound Status Wound Number: 1 Primary Diabetic Wound/Ulcer of the Lower Extremity Etiology: Wound Location: Right, Plantar Metatarsal head fourth Wound Status: Open Wounding Event: Gradually Appeared Comorbid Cataracts, Hypertension, Myocardial Infarction, Type II Date Acquired: 07/01/2020 History: Diabetes Weeks Of Treatment: 0 Clustered Wound: No Photos Wound Measurements Length: (cm) 1.6 Width: (cm) 0.8 Depth: (cm) 0.1 Area: (cm) 1.005 Volume: (cm) 0.101 % Reduction in Area: 0% % Reduction in Volume: 0% Epithelialization: None Tunneling: No Undermining: No Wound Description Classification: Grade 1 Wound Margin: Thickened Exudate Amount:  Medium Exudate Type: Sanguinous Exudate Color: red Foul Odor After Cleansing: No Slough/Fibrino No Wound Bed Granulation Amount: Large (67-100%) Exposed Structure Granulation Quality: Red Fascia Exposed: No Necrotic Amount: None Present (0%) Fat Layer (Subcutaneous Tissue) Exposed: Yes Tendon Exposed: No Muscle Exposed: No Joint Exposed: No Bone Exposed: No Treatment Notes Wound #1 (Metatarsal head fourth) Wound Laterality: Plantar, Right Cleanser [place] Ancillary Kit - 15 Day Supply Discharge Instruction: Use supplies as instructed; Kit contains: (15) Saline Bullets; (15) 3x3 Gauze; 15 pr Gloves Normal Saline Hickel, Manuel P. ([id number]) Discharge Instruction: Wash your hands with soap and water. Remove old dressing, discard into plastic bag and place into trash. Cleanse the wound with Normal Saline prior to applying a clean dressing using gauze sponges, not tissues or cotton balls. Do not scrub or use excessive force. Pat dry using gauze sponges, not tissue or cotton balls. Peri-Wound Care Topical Primary Dressing Prisma 4.34 (in) Discharge Instruction: Moisten w/normal saline or sterile water; Cover wound as directed. Do not remove from wound bed. Secondary Dressing Mepilex Border Flex, 4x4 (in/in) Discharge Instruction: Apply to wound as directed. Do not cut. Secured With Compression Wrap Compression Stockings Environmental education officer) Signed: 07/11/2020 5:09:07 PM By: Georges Mouse, Minus Breeding RN Entered By: Georges Mouse, Kenia on 07/11/2020 10:35:13 Manuel [name] ([id number]) -------------------------------------------------------------------------------- Vitals Details Patient Name: Manuel [name]. Date of Service: 07/11/2020 9:30 AM Medical Record Number: [id number] Patient Account Number: [id number] Date of Birth/Sex: 1945-07-19 (75 y.o. Male) Treating RN: Dolan Amen Primary Care Cauy Melody: Harrel Lemon Other Clinician: Jeanine Luz Referring Denym Rahimi: Norton Pastel Treating Oona Trammel/Extender: Skipper Cliche in Treatment: 0 Vital Signs Time Taken: 09:32 Temperature (F): 98.2 Height (in): 68 Pulse (bpm): 82 Source: Stated Respiratory Rate (breaths/min): 20 Weight (lbs): 190 Blood Pressure (mmHg): 170/82 Source: Measured Reference Range: 80 - 120 mg / dl Body Mass Index (BMI): 28.9 Electronic Signature(s)  Signed: 07/11/2020 3:37:26 PM By: Jeanine Luz Entered By: Jeanine Luz on 07/11/2020 09:32:49

## 2020-07-12 NOTE — Progress Notes (Signed)
[name], HEMENWAY ([id number]) Visit Report for 07/11/2020 Chief Complaint Document Details Patient Name: Manuel [name], Manuel [name]. Date of Service: 07/11/2020 9:30 AM Medical Record Number: [id number] Patient Account Number: [id number] Date of Birth/Sex: 1945-10-11 (75 y.o. Male) Treating RN: Dolan Amen Primary Care Provider: Harrel Lemon Other Clinician: Jeanine Luz Referring Provider: Norton Pastel Treating Provider/Extender: Skipper Cliche in Treatment: 0 Information Obtained from: Patient Chief Complaint Right foot ulcer Electronic Signature(s) Signed: 07/11/2020 10:02:34 AM By: Worthy Keeler PA-C Entered By: Worthy Keeler on 07/11/2020 10:02:33 Manuel [name] ([id number]) -------------------------------------------------------------------------------- Debridement Details Patient Name: Manuel [name]. Date of Service: 07/11/2020 9:30 AM Medical Record Number: [id number] Patient Account Number: [id number] Date of Birth/Sex: 1945/11/09 (75 y.o. Male) Treating RN: Dolan Amen Primary Care Provider: Harrel Lemon Other Clinician: Jeanine Luz Referring Provider: Norton Pastel Treating Provider/Extender: Skipper Cliche in Treatment: 0 Debridement Performed for Wound #1 Right,Plantar Metatarsal head fourth Assessment: Performed By: Physician Tommie Sams., PA-C Debridement Type: Debridement Severity of Tissue Pre Debridement: Fat layer exposed Level of Consciousness (Pre- Awake and Alert procedure): Pre-procedure Verification/Time Out Yes - 10:10 Taken: Start Time: 10:10 Pain Control: Lidocaine 4% Topical Solution Total Area Debrided (L x W): 1.6 (cm) x 0.8 (cm) = 1.28 (cm) Tissue and other material Viable, Non-Viable, Callus, Subcutaneous debrided: Level: Skin/Subcutaneous Tissue Debridement Description: Excisional Instrument: Curette Bleeding: Minimum Hemostasis Achieved: Pressure Response to Treatment: Procedure was tolerated  well Level of Consciousness (Post- Awake and Alert procedure): Post Debridement Measurements of Total Wound Length: (cm) 0.9 Width: (cm) 0.2 Depth: (cm) 0.3 Volume: (cm) 0.042 Character of Wound/Ulcer Post Debridement: Stable Severity of Tissue Post Debridement: Fat layer exposed Post Procedure Diagnosis Same as Pre-procedure Electronic Signature(s) Signed: 07/11/2020 5:09:07 PM By: Charlett Nose RN Signed: 07/11/2020 7:06:18 PM By: Worthy Keeler PA-C Entered By: Georges Mouse, Minus Breeding on 07/11/2020 10:19:50 Manuel [name] ([id number]) -------------------------------------------------------------------------------- HPI Details Patient Name: Manuel [name]. Date of Service: 07/11/2020 9:30 AM Medical Record Number: [id number] Patient Account Number: [id number] Date of Birth/Sex: Jan 16, 1946 (75 y.o. Male) Treating RN: Dolan Amen Primary Care Provider: Harrel Lemon Other Clinician: Jeanine Luz Referring Provider: Norton Pastel Treating Provider/Extender: Skipper Cliche in Treatment: 0 History of Present Illness HPI Description: 07/11/2020 upon evaluation today patient appears to be unfortunately doing somewhat poorly in regard to the wound over the plantar foot at the head of the fourth metatarsal region base of his fourth toe. Fortunately there does not appear to be any signs of infection he has been on antibiotics as prescribed by the urgent care this is great news. He does have a history of diabetes mellitus type 2, hypertension, alcohol abuse, and he tells me that really his blood pressure as well as his diabetes is not under good control. He also tells me that when he drinks more he tends to have more problems with his feet with regard to wounds and otherwise. Some of this may be just simple care for his feet or the rather lack thereof. Nonetheless he tells me that he is definitely aware that he needs to make some changes with regard to lifestyle.  The wound currently has been present for about 2 weeks. Electronic Signature(s) Signed: 07/11/2020 6:57:30 PM By: Worthy Keeler PA-C Entered By: Worthy Keeler on 07/11/2020 18:57:30 Manuel [name], Manuel [name] ([id number]) -------------------------------------------------------------------------------- Physical Exam Details Patient Name: Manuel [name]. Date of Service: 07/11/2020 9:30 AM Medical Record Number: [id number] Patient Account Number: [id number] Date of Birth/Sex: 1945/06/03 (75 y.o. Male) Treating  RN: Dolan Amen Primary Care Provider: Harrel Lemon Other Clinician: Jeanine Luz Referring Provider: Norton Pastel Treating Provider/Extender: Skipper Cliche in Treatment: 0 Constitutional patient is hypertensive.. pulse regular and within target range for patient.Marland Kitchen respirations regular, non-labored and within target range for patient.Marland Kitchen temperature within target range for patient.. Well-nourished and well-hydrated in no acute distress. Eyes conjunctiva clear no eyelid edema noted. pupils equal round and reactive to light and accommodation. Ears, Nose, Mouth, and Throat no gross abnormality of ear auricles or external auditory canals. normal hearing noted during conversation. mucus membranes moist. Respiratory normal breathing without difficulty. Cardiovascular 2+ dorsalis pedis/posterior tibialis pulses. no clubbing, cyanosis, significant edema, <3 sec cap refill. Musculoskeletal normal gait and posture. no significant deformity or arthritic changes, no loss or range of motion, no clubbing. Psychiatric this patient is able to make decisions and demonstrates good insight into disease process. Alert and Oriented x 3. pleasant and cooperative. Notes Upon inspection patient's wound bed actually showed signs of good granulation epithelization at this point. There does not appear to be any evidence of active infection which is great news and overall very pleased in that  regard. His blood pressure is high although I think this is likely to some degree attributed to the fact that the patient is experiencing significant issues with compliance with regard to his medications. I think a lot of this is alcohol related. Nonetheless I did have a conversation with him about getting his blood sugars down, blood pressure down, and at the same time avoiding excessive alcohol intake. All of which are things that he knows and he tells me his primary care provider often has conversations like this with him every 3 months. Electronic Signature(s) Signed: 07/11/2020 6:58:45 PM By: Worthy Keeler PA-C Entered By: Worthy Keeler on 07/11/2020 18:58:45 Manuel [name] ([id number]) -------------------------------------------------------------------------------- Physician Orders Details Patient Name: Manuel [name]. Date of Service: 07/11/2020 9:30 AM Medical Record Number: [id number] Patient Account Number: [id number] Date of Birth/Sex: 1945/11/20 (75 y.o. Male) Treating RN: Dolan Amen Primary Care Provider: Harrel Lemon Other Clinician: Jeanine Luz Referring Provider: Norton Pastel Treating Provider/Extender: Skipper Cliche in Treatment: 0 Verbal / Phone Orders: No Diagnosis Coding ICD-10 Coding Code Description E11.621 Type 2 diabetes mellitus with foot ulcer L97.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posed I10 Essential (primary) hypertension F10.10 Alcohol abuse, uncomplicated Follow-up Appointments o Return Appointment in 1 week. Off-Loading Wound #1 Right,Plantar Metatarsal head fourth o Open toe surgical shoe with peg assist. Wound Treatment Wound #1 - Metatarsal head fourth Wound Laterality: Plantar, Right Cleanser: Byram Ancillary Kit - 15 Day Supply (DME) (Generic) 3 x Per Week/30 Days Discharge Instructions: Use supplies as instructed; Kit contains: (15) Saline Bullets; (15) 3x3 Gauze; 15 pr  Gloves Cleanser: Normal Saline (DME) (Generic) 3 x Per Week/30 Days Discharge Instructions: Wash your hands with soap and water. Remove old dressing, discard into plastic bag and place into trash. Cleanse the wound with Normal Saline prior to applying a clean dressing using gauze sponges, not tissues or cotton balls. Do not scrub or use excessive force. Pat dry using gauze sponges, not tissue or cotton balls. Primary Dressing: Prisma 4.34 (in) 3 x Per Week/30 Days Discharge Instructions: Moisten w/normal saline or sterile water; Cover wound as directed. Do not remove from wound bed. Secondary Dressing: Mepilex Border Flex, 4x4 (in/in) (DME) (Generic) 3 x Per Week/30 Days Discharge Instructions: Apply to wound as directed. Do not cut. Electronic Signature(s) Signed:  07/11/2020 5:09:07 PM By: Georges Mouse, Minus Breeding RN Signed: 07/11/2020 7:06:18 PM By: Worthy Keeler PA-C Entered By: Georges Mouse, Minus Breeding on 07/11/2020 10:37:01 Manuel [name] ([id number]) -------------------------------------------------------------------------------- Problem List Details Patient Name: DISHON, KEHOE. Date of Service: 07/11/2020 9:30 AM Medical Record Number: [id number] Patient Account Number: [id number] Date of Birth/Sex: 1946-03-26 (75 y.o. Male) Treating RN: Dolan Amen Primary Care Provider: Harrel Lemon Other Clinician: Jeanine Luz Referring Provider: Norton Pastel Treating Provider/Extender: Jeri Cos Weeks in Treatment: 0 Active Problems ICD-10 Encounter Code Description Active Date MDM Diagnosis E11.621 Type 2 diabetes mellitus with foot ulcer 07/11/2020 No Yes L97.512 Non-pressure chronic ulcer of other part of right foot with fat layer 07/11/2020 No Yes exposed [place] (primary) hypertension 07/11/2020 No Yes F10.10 Alcohol abuse, uncomplicated 3/71/0626 No Yes Inactive Problems Resolved Problems Electronic Signature(s) Signed: 07/11/2020 10:01:35 AM By: Worthy Keeler PA-C Entered By: Worthy Keeler on 07/11/2020 10:01:35 Manuel [name] ([id number]) -------------------------------------------------------------------------------- Progress Note Details Patient Name: Manuel [name]. Date of Service: 07/11/2020 9:30 AM Medical Record Number: [id number] Patient Account Number: [id number] Date of Birth/Sex: 11-19-1945 (75 y.o. Male) Treating RN: Dolan Amen Primary Care Provider: Harrel Lemon Other Clinician: Jeanine Luz Referring Provider: Norton Pastel Treating Provider/Extender: Skipper Cliche in Treatment: 0 Subjective Chief Complaint Information obtained from Patient Right foot ulcer History of Present Illness (HPI) 07/11/2020 upon evaluation today patient appears to be unfortunately doing somewhat poorly in regard to the wound over the plantar foot at the head of the fourth metatarsal region base of his fourth toe. Fortunately there does not appear to be any signs of infection he has been on antibiotics as prescribed by the urgent care this is great news. He does have a history of diabetes mellitus type 2, hypertension, alcohol abuse, and he tells me that really his blood pressure as well as his diabetes is not under good control. He also tells me that when he drinks more he tends to have more problems with his feet with regard to wounds and otherwise. Some of this may be just simple care for his feet or the rather lack thereof. Nonetheless he tells me that he is definitely aware that he needs to make some changes with regard to lifestyle. The wound currently has been present for about 2 weeks. Patient History Information obtained from Patient. Allergies No Known Allergies Social History Never smoker, Alcohol Use - Daily, Drug Use - No History, Caffeine Use - Daily. Medical History Eyes Patient has history of Cataracts Denies history of Glaucoma, Optic Neuritis Ear/Nose/Mouth/Throat Denies history of Chronic sinus  problems/congestion, Middle ear problems Hematologic/Lymphatic Denies history of Anemia, Hemophilia, Human Immunodeficiency Virus, Lymphedema, Sickle Cell Disease Respiratory Denies history of Aspiration, Asthma, Chronic Obstructive Pulmonary Disease (COPD), Pneumothorax, Sleep Apnea, Tuberculosis Cardiovascular Patient has history of Hypertension - 20 years, Myocardial Infarction - 15 years ago Denies history of Angina, Arrhythmia, Congestive Heart Failure, Coronary Artery Disease, Deep Vein Thrombosis, Hypotension, Peripheral Arterial Disease, Peripheral Venous Disease, Phlebitis, Vasculitis Gastrointestinal Denies history of Cirrhosis , Colitis, Crohn s, Hepatitis A, Hepatitis B, Hepatitis C Endocrine Patient has history of Type II Diabetes - 13 years Genitourinary Denies history of End Stage Renal Disease Immunological Denies history of Lupus Erythematosus, Raynaud s, Scleroderma Integumentary (Skin) Denies history of History of Burn, History of pressure wounds Musculoskeletal Denies history of Gout, Rheumatoid Arthritis, Osteoarthritis, Osteomyelitis Neurologic Denies history of Dementia, Neuropathy, Quadriplegia, Paraplegia, Seizure Disorder Oncologic Denies history of Received Chemotherapy, Received Radiation Psychiatric Denies history of  Anorexia/bulimia, Confinement Anxiety Patient is treated with Insulin, Oral Agents. Blood sugar is not tested. Review of Systems (ROS) Constitutional Symptoms (General Health) Denies complaints or symptoms of Fatigue, Fever, Chills, Marked Weight Change. Eyes Denies complaints or symptoms of Dry Eyes, Vision Changes, Glasses / Contacts. Ear/Nose/Mouth/Throat Manuel [name] ([id number]) Denies complaints or symptoms of Difficult clearing ears, Sinusitis. Hematologic/Lymphatic Denies complaints or symptoms of Bleeding / Clotting Disorders, Human Immunodeficiency Virus. Respiratory Denies complaints or symptoms of Chronic or frequent  coughs, Shortness of Breath. Cardiovascular Denies complaints or symptoms of Chest pain, LE edema. Gastrointestinal Denies complaints or symptoms of Frequent diarrhea, Nausea, Vomiting. Endocrine Denies complaints or symptoms of Hepatitis, Thyroid disease, Polydypsia (Excessive Thirst). Genitourinary Denies complaints or symptoms of Kidney failure/ Dialysis, Incontinence/dribbling. Immunological Denies complaints or symptoms of Hives, Itching. Integumentary (Skin) Complains or has symptoms of Wounds. Denies complaints or symptoms of Bleeding or bruising tendency, Breakdown, Swelling. Musculoskeletal Denies complaints or symptoms of Muscle Pain, Muscle Weakness. Neurologic Denies complaints or symptoms of Numbness/parasthesias, Focal/Weakness. Psychiatric Denies complaints or symptoms of Anxiety, Claustrophobia. Objective Constitutional patient is hypertensive.. pulse regular and within target range for patient.Marland Kitchen respirations regular, non-labored and within target range for patient.Marland Kitchen temperature within target range for patient.. Well-nourished and well-hydrated in no acute distress. Vitals Time Taken: 9:32 AM, Height: 68 in, Source: Stated, Weight: 190 lbs, Source: Measured, BMI: 28.9, Temperature: 98.2 F, Pulse: 82 bpm, Respiratory Rate: 20 breaths/min, Blood Pressure: 170/82 mmHg. Eyes conjunctiva clear no eyelid edema noted. pupils equal round and reactive to light and accommodation. Ears, Nose, Mouth, and Throat no gross abnormality of ear auricles or external auditory canals. normal hearing noted during conversation. mucus membranes moist. Respiratory normal breathing without difficulty. Cardiovascular 2+ dorsalis pedis/posterior tibialis pulses. no clubbing, cyanosis, significant edema, Musculoskeletal normal gait and posture. no significant deformity or arthritic changes, no loss or range of motion, no clubbing. Psychiatric this patient is able to make decisions and  demonstrates good insight into disease process. Alert and Oriented x 3. pleasant and cooperative. General Notes: Upon inspection patient's wound bed actually showed signs of good granulation epithelization at this point. There does not appear to be any evidence of active infection which is great news and overall very pleased in that regard. His blood pressure is high although I think this is likely to some degree attributed to the fact that the patient is experiencing significant issues with compliance with regard to his medications. I think a lot of this is alcohol related. Nonetheless I did have a conversation with him about getting his blood sugars down, blood pressure down, and at the same time avoiding excessive alcohol intake. All of which are things that he knows and he tells me his primary care provider often has conversations like this with him every 3 months. Integumentary (Hair, Skin) Wound #1 status is Open. Original cause of wound was Gradually Appeared. The date acquired was: 07/01/2020. The wound is located on the Right,Plantar Metatarsal head fourth. The wound measures 1.6cm length x 0.8cm width x 0.1cm depth; 1.005cm^2 area and 0.101cm^3 volume. There is Fat Layer (Subcutaneous Tissue) exposed. There is no tunneling or undermining noted. There is a medium amount of sanguinous drainage noted. The wound margin is thickened. There is large (67-100%) red granulation within the wound bed. There is no necrotic tissue within the wound bed. Manuel [name], Manuel [name] ([id number]) Assessment Active Problems ICD-10 Type 2 diabetes mellitus with foot ulcer Non-pressure chronic ulcer of other part of right foot with fat layer exposed  Essential (primary) hypertension Alcohol abuse, uncomplicated Procedures Wound #1 Pre-procedure diagnosis of Wound #1 is a Diabetic Wound/Ulcer of the Lower Extremity located on the Right,Plantar Metatarsal head fourth .Severity of Tissue Pre Debridement is: Fat layer  exposed. There was a Excisional Skin/Subcutaneous Tissue Debridement with a total area of 1.28 sq cm performed by Tommie Sams., PA-C. With the following instrument(s): Curette to remove Viable and Non-Viable tissue/material. Material removed includes Callus and Subcutaneous Tissue and after achieving pain control using Lidocaine 4% Topical Solution. A time out was conducted at 10:10, prior to the start of the procedure. A Minimum amount of bleeding was controlled with Pressure. The procedure was tolerated well. Post Debridement Measurements: 0.9cm length x 0.2cm width x 0.3cm depth; 0.042cm^3 volume. Character of Wound/Ulcer Post Debridement is stable. Severity of Tissue Post Debridement is: Fat layer exposed. Post procedure Diagnosis Wound #1: Same as Pre-Procedure Plan Follow-up Appointments: Return Appointment in 1 week. Off-Loading: Wound #1 Right,Plantar Metatarsal head fourth: Open toe surgical shoe with peg assist. WOUND #1: - Metatarsal head fourth Wound Laterality: Plantar, Right Cleanser: Byram Ancillary Kit - 15 Day Supply (DME) (Generic) 3 x Per Week/30 Days Discharge Instructions: Use supplies as instructed; Kit contains: (15) Saline Bullets; (15) 3x3 Gauze; 15 pr Gloves Cleanser: Normal Saline (DME) (Generic) 3 x Per Week/30 Days Discharge Instructions: Wash your hands with soap and water. Remove old dressing, discard into plastic bag and place into trash. Cleanse the wound with Normal Saline prior to applying a clean dressing using gauze sponges, not tissues or cotton balls. Do not scrub or use excessive force. Pat dry using gauze sponges, not tissue or cotton balls. Primary Dressing: Prisma 4.34 (in) 3 x Per Week/30 Days Discharge Instructions: Moisten w/normal saline or sterile water; Cover wound as directed. Do not remove from wound bed. Secondary Dressing: Mepilex Border Flex, 4x4 (in/in) (DME) (Generic) 3 x Per Week/30 Days Discharge Instructions: Apply to wound as  directed. Do not cut. 1. I am going to suggest currently that we go ahead and initiate treatment with silver collagen I think this is probably the primarily best option for him currently. 2. I am also can recommend that we use a border foam dressing to cover. 3. I am also going to suggest a PEG assist offloading shoe to help with pressure relief over the area to try to get this area to heal more effectively and quickly. We will see patient back for reevaluation in 1 week here in the clinic. If anything worsens or changes patient will contact our office for additional recommendations. Electronic Signature(s) Signed: 07/11/2020 6:59:22 PM By: Worthy Keeler PA-C Entered By: Worthy Keeler on 07/11/2020 18:59:22 Manuel [name], Manuel [name] ([id number]) -------------------------------------------------------------------------------- ROS/PFSH Details Patient Name: Manuel [name]. Date of Service: 07/11/2020 9:30 AM Medical Record Number: [id number] Patient Account Number: [id number] Date of Birth/Sex: Dec 23, 1945 (75 y.o. Male) Treating RN: Dolan Amen Primary Care Provider: Harrel Lemon Other Clinician: Jeanine Luz Referring Provider: Norton Pastel Treating Provider/Extender: Skipper Cliche in Treatment: 0 Information Obtained From Patient Constitutional Symptoms (General Health) Complaints and Symptoms: Negative for: Fatigue; Fever; Chills; Marked Weight Change Eyes Complaints and Symptoms: Negative for: Dry Eyes; Vision Changes; Glasses / Contacts Medical History: Positive for: Cataracts Negative for: Glaucoma; Optic Neuritis Ear/Nose/Mouth/Throat Complaints and Symptoms: Negative for: Difficult clearing ears; Sinusitis Medical History: Negative for: Chronic sinus problems/congestion; Middle ear problems Hematologic/Lymphatic Complaints and Symptoms: Negative for: Bleeding / Clotting Disorders; Human Immunodeficiency Virus Medical History: Negative for: Anemia;  Hemophilia; Human  Immunodeficiency Virus; Lymphedema; Sickle Cell Disease Respiratory Complaints and Symptoms: Negative for: Chronic or frequent coughs; Shortness of Breath Medical History: Negative for: Aspiration; Asthma; Chronic Obstructive Pulmonary Disease (COPD); Pneumothorax; Sleep Apnea; Tuberculosis Cardiovascular Complaints and Symptoms: Negative for: Chest pain; LE edema Medical History: Positive for: Hypertension - 20 years; Myocardial Infarction - 15 years ago Negative for: Angina; Arrhythmia; Congestive Heart Failure; Coronary Artery Disease; Deep Vein Thrombosis; Hypotension; Peripheral Arterial Disease; Peripheral Venous Disease; Phlebitis; Vasculitis Gastrointestinal Complaints and Symptoms: Negative for: Frequent diarrhea; Nausea; Vomiting Medical History: Negative for: Cirrhosis ; Colitis; Crohnos; Hepatitis A; Hepatitis B; Hepatitis C Endocrine Manuel [name], Manuel [name]. ([id number]) Complaints and Symptoms: Negative for: Hepatitis; Thyroid disease; Polydypsia (Excessive Thirst) Medical History: Positive for: Type II Diabetes - 13 years Time with diabetes: 13 years Treated with: Insulin, Oral agents Blood sugar tested every day: No Genitourinary Complaints and Symptoms: Negative for: Kidney failure/ Dialysis; Incontinence/dribbling Medical History: Negative for: End Stage Renal Disease Immunological Complaints and Symptoms: Negative for: Hives; Itching Medical History: Negative for: Lupus Erythematosus; Raynaudos; Scleroderma Integumentary (Skin) Complaints and Symptoms: Positive for: Wounds Negative for: Bleeding or bruising tendency; Breakdown; Swelling Medical History: Negative for: History of Burn; History of pressure wounds Musculoskeletal Complaints and Symptoms: Negative for: Muscle Pain; Muscle Weakness Medical History: Negative for: Gout; Rheumatoid Arthritis; Osteoarthritis; Osteomyelitis Neurologic Complaints and Symptoms: Negative for:  Numbness/parasthesias; Focal/Weakness Medical History: Negative for: Dementia; Neuropathy; Quadriplegia; Paraplegia; Seizure Disorder Psychiatric Complaints and Symptoms: Negative for: Anxiety; Claustrophobia Medical History: Negative for: Anorexia/bulimia; Confinement Anxiety Oncologic Medical History: Negative for: Received Chemotherapy; Received Radiation HBO Extended History Items Eyes: Cataracts Immunizations Pneumococcal Vaccine: Manuel [name], Manuel [name] ([id number]) Received Pneumococcal Vaccination: No Implantable Devices None Family and Social History Never smoker; Alcohol Use: Daily; Drug Use: No History; Caffeine Use: Daily; Financial Concerns: No; Food, Clothing or Shelter Needs: No; Support System Lacking: No; Transportation Concerns: No Electronic Signature(s) Signed: 07/11/2020 3:37:26 PM By: Jeanine Luz Signed: 07/11/2020 5:09:07 PM By: Georges Mouse, Minus Breeding RN Signed: 07/11/2020 7:06:18 PM By: Worthy Keeler PA-C Entered By: Jeanine Luz on 07/11/2020 09:40:33 Manuel [name], Manuel [name] ([id number]) -------------------------------------------------------------------------------- SuperBill Details Patient Name: Manuel [name]. Date of Service: 07/11/2020 Medical Record Number: [id number] Patient Account Number: [id number] Date of Birth/Sex: 1945-06-12 (75 y.o. Male) Treating RN: Dolan Amen Primary Care Provider: Harrel Lemon Other Clinician: Jeanine Luz Referring Provider: Norton Pastel Treating Provider/Extender: Jeri Cos Weeks in Treatment: 0 Diagnosis Coding ICD-10 Codes Code Description E11.621 Type 2 diabetes mellitus with foot ulcer L97.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posed I10 Essential (primary) hypertension F10.10 Alcohol abuse, uncomplicated Facility Procedures CPT4 Code: 17793903 Description: 99213 - WOUND CARE VISIT-LEV 3 EST PT Modifier: Quantity: 1 CPT4 Code: 00923300 Description:  11042 - DEB SUBQ TISSUE 20 SQ CM/< Modifier: Quantity: 1 CPT4 Code: Description: ICD-10 Diagnosis Description L97.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 Modifier: posed Quantity: Physician Procedures CPT4 Code: 7622633 Description: WC PHYS LEVEL 3 o NEW PT Modifier: 25 Quantity: 1 CPT4 Code: Description: ICD-10 Diagnosis Description 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

## 2020-07-18 ENCOUNTER — Encounter: Payer: Managed Care, Other (non HMO) | Admitting: Physician Assistant

## 2020-07-18 ENCOUNTER — Other Ambulatory Visit: Payer: Self-pay

## 2020-07-18 DIAGNOSIS — E11621 Type 2 diabetes mellitus with foot ulcer: Secondary | ICD-10-CM | POA: Diagnosis not present

## 2020-07-18 NOTE — Progress Notes (Addendum)
Manuel, [name] ([id number]) Visit Report for 07/18/2020 Chief Complaint Document Details Patient Name: Manuel [name], Manuel [name]. Date of Service: 07/18/2020 10:00 AM Medical Record Number: [id number] Patient Account Number: [id number] Date of Birth/Sex: 11/09/1945 (75 y.o. M) Treating RN: Rogers Blocker Primary Care Provider: Marcelino Duster Other Clinician: Lolita Cram Referring Provider: Marcelino Duster Treating Provider/Extender: Rowan Blase in Treatment: 1 Information Obtained from: Patient Chief Complaint Right foot ulcer Electronic Signature(s) Signed: 07/18/2020 10:12:54 AM By: Lenda Kelp PA-C Entered By: Lenda Kelp on 07/18/2020 10:12:54 Manuel [name] ([id number]) -------------------------------------------------------------------------------- Debridement Details Patient Name: Manuel [name]. Date of Service: 07/18/2020 10:00 AM Medical Record Number: [id number] Patient Account Number: [id number] Date of Birth/Sex: Feb 19, 1946 (75 y.o. M) Treating RN: Rogers Blocker Primary Care Provider: Marcelino Duster Other Clinician: Lolita Cram Referring Provider: Marcelino Duster Treating Provider/Extender: Allen Derry Weeks in Treatment: 1 Debridement Performed for Wound #1 Right,Plantar Metatarsal head fourth Assessment: Performed By: Physician Nelida Meuse., PA-C Debridement Type: Debridement Severity of Tissue Pre Debridement: Fat layer exposed Level of Consciousness (Pre- Awake and Alert procedure): Pre-procedure Verification/Time Out Yes - 10:16 Taken: Start Time: 10:16 Total Area Debrided (L x W): 1 (cm) x 0.4 (cm) = 0.4 (cm) Tissue and other material Non-Viable, Callus debrided: Level: Non-Viable Tissue Debridement Description: Selective/Open Wound Instrument: Curette Bleeding: None Response to Treatment: Procedure was tolerated well Level of Consciousness (Post- Awake and Alert procedure): Post Debridement Measurements of Total  Wound Length: (cm) 0.6 Width: (cm) 0.1 Depth: (cm) 0.1 Volume: (cm) 0.005 Character of Wound/Ulcer Post Debridement: Stable Severity of Tissue Post Debridement: Fat layer exposed Post Procedure Diagnosis Same as Pre-procedure Electronic Signature(s) Signed: 07/18/2020 2:16:03 PM By: Lenda Kelp PA-C Signed: 07/18/2020 5:07:41 PM By: Phillis Haggis, Dondra Prader RN Entered By: Phillis Haggis, Dondra Prader on 07/18/2020 10:22:12 Manuel [name] ([id number]) -------------------------------------------------------------------------------- HPI Details Patient Name: Manuel [name]. Date of Service: 07/18/2020 10:00 AM Medical Record Number: [id number] Patient Account Number: [id number] Date of Birth/Sex: 06/06/1945 (75 y.o. M) Treating RN: Rogers Blocker Primary Care Provider: Marcelino Duster Other Clinician: Lolita Cram Referring Provider: Marcelino Duster Treating Provider/Extender: Rowan Blase in Treatment: 1 History of Present Illness HPI Description: 07/11/2020 upon evaluation today patient appears to be unfortunately doing somewhat poorly in regard to the wound over the plantar foot at the head of the fourth metatarsal region base of his fourth toe. Fortunately there does not appear to be any signs of infection he has been on antibiotics as prescribed by the urgent care this is great news. He does have a history of diabetes mellitus type 2, hypertension, alcohol abuse, and he tells me that really his blood pressure as well as his diabetes is not under good control. He also tells me that when he drinks more he tends to have more problems with his feet with regard to wounds and otherwise. Some of this may be just simple care for his feet or the rather lack thereof. Nonetheless he tells me that he is definitely aware that he needs to make some changes with regard to lifestyle. The wound currently has been present for about 2 weeks. 07/18/2020 on evaluation today patient appears to be  doing well with regard to his wound. Has been tolerating the dressing changes without complication. There does not appear to be any signs of active infection which is great news and overall very pleased with where things stand today. Electronic Signature(s) Signed: 07/18/2020 10:30:11 AM By: Lenda Kelp PA-C Entered By: Lenda Kelp on  07/18/2020 10:30:11 CHASETON, YEPIZ ([id number]) -------------------------------------------------------------------------------- Physical Exam Details Patient Name: Manuel [name]. Date of Service: 07/18/2020 10:00 AM Medical Record Number: [id number] Patient Account Number: [id number] Date of Birth/Sex: Jun 20, 1945 (75 y.o. M) Treating RN: Rogers Blocker Primary Care Provider: Marcelino Duster Other Clinician: Lolita Cram Referring Provider: Marcelino Duster Treating Provider/Extender: Allen Derry Weeks in Treatment: 1 Constitutional Well-nourished and well-hydrated in no acute distress. Respiratory normal breathing without difficulty. Psychiatric this patient is able to make decisions and demonstrates good insight into disease process. Alert and Oriented x 3. pleasant and cooperative. Notes Upon inspection patient's wound bed actually showed signs of good granulation epithelization I did perform debridement to clear away some of the necrotic debris including callus from the surface of the wound he tolerated all that today without complication and post debridement the wound bed appears to be doing much better which is great news. Electronic Signature(s) Signed: 07/18/2020 7:30:54 PM By: Lenda Kelp PA-C Entered By: Lenda Kelp on 07/18/2020 19:30:54 Manuel [name] ([id number]) -------------------------------------------------------------------------------- Physician Orders Details Patient Name: Manuel [name]. Date of Service: 07/18/2020 10:00 AM Medical Record Number: [id number] Patient Account Number: [id number] Date of  Birth/Sex: June 23, 1945 (75 y.o. M) Treating RN: Rogers Blocker Primary Care Provider: Marcelino Duster Other Clinician: Lolita Cram Referring Provider: Marcelino Duster Treating Provider/Extender: Rowan Blase in Treatment: 1 Verbal / Phone Orders: No Diagnosis Coding ICD-10 Coding Code Description E11.621 Type 2 diabetes mellitus with foot ulcer L97.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posed I10 Essential (primary) hypertension F10.10 Alcohol abuse, uncomplicated Follow-up Appointments o Return Appointment in 1 week. Off-Loading Wound #1 Right,Plantar Metatarsal head fourth o Open toe surgical shoe with peg assist. - Continue wearing Wound Treatment Wound #1 - Metatarsal head fourth Wound Laterality: Plantar, Right Cleanser: Normal Saline (Generic) 3 x Per Week/30 Days Discharge Instructions: Wash your hands with soap and water. Remove old dressing, discard into plastic bag and place into trash. Cleanse the wound with Normal Saline prior to applying a clean dressing using gauze sponges, not tissues or cotton balls. Do not scrub or use excessive force. Pat dry using gauze sponges, not tissue or cotton balls. Primary Dressing: Prisma 4.34 (in) 3 x Per Week/30 Days Discharge Instructions: Moisten w/normal saline or sterile water; Cover wound as directed. Do not remove from wound bed. Secondary Dressing: Mepilex Border Flex, 4x4 (in/in) (Generic) 3 x Per Week/30 Days Discharge Instructions: Apply to wound as directed. Do not cut. Electronic Signature(s) Signed: 07/18/2020 2:16:03 PM By: Lenda Kelp PA-C Signed: 07/18/2020 5:07:41 PM By: Phillis Haggis, Dondra Prader RN Entered By: Phillis Haggis, Dondra Prader on 07/18/2020 10:20:30 Manuel [name] ([id number]) -------------------------------------------------------------------------------- Problem List Details Patient Name: CUYLER, VANDYKEN. Date of Service: 07/18/2020 10:00 AM Medical Record  Number: [id number] Patient Account Number: [id number] Date of Birth/Sex: Jan 16, 1946 (75 y.o. M) Treating RN: Rogers Blocker Primary Care Provider: Marcelino Duster Other Clinician: Lolita Cram Referring Provider: Marcelino Duster Treating Provider/Extender: Allen Derry Weeks in Treatment: 1 Active Problems ICD-10 Encounter Code Description Active Date MDM Diagnosis E11.621 Type 2 diabetes mellitus with foot ulcer 07/11/2020 No Yes L97.512 Non-pressure chronic ulcer of other part of right foot with fat layer 07/11/2020 No Yes exposed I10 Essential (primary) hypertension 07/11/2020 No Yes F10.10 Alcohol abuse, uncomplicated 07/11/2020 No Yes Inactive Problems Resolved Problems Electronic Signature(s) Signed: 07/18/2020 10:12:46 AM By: Lenda Kelp PA-C Entered By: Lenda Kelp on 07/18/2020 10:12:46 Manuel [name] ([id number]) -------------------------------------------------------------------------------- Progress Note Details Patient  Name: Manuel FlockGOODSON, Takari P. Date of Service: 07/18/2020 10:00 AM Medical Record Number: [id number] Patient Account Number: [id number] Date of Birth/Sex: May 29, 1945 [age] y.o. M) Treating RN: Rogers BlockerSanchez, Kenia Primary Care Provider: Marcelino DusterJohnston, John Other Clinician: Lolita CramBurnette, Kyara Referring Provider: Marcelino DusterJohnston, John Treating Provider/Extender: Rowan BlaseStone, Jalayne Ganesh Weeks in Treatment: 1 Subjective Chief Complaint Information obtained from Patient Right foot ulcer History of Present Illness (HPI) 07/11/2020 upon evaluation today patient appears to be unfortunately doing somewhat poorly in regard to the wound over the plantar foot at the head of the fourth metatarsal region base of his fourth toe. Fortunately there does not appear to be any signs of infection he has been on antibiotics as prescribed by the urgent care this is great news. He does have a history of diabetes mellitus type 2, hypertension, alcohol abuse, and he tells me that really his blood pressure as  well as his diabetes is not under good control. He also tells me that when he drinks more he tends to have more problems with his feet with regard to wounds and otherwise. Some of this may be just simple care for his feet or the rather lack thereof. Nonetheless he tells me that he is definitely aware that he needs to make some changes with regard to lifestyle. The wound currently has been present for about 2 weeks. 07/18/2020 on evaluation today patient appears to be doing well with regard to his wound. Has been tolerating the dressing changes without complication. There does not appear to be any signs of active infection which is great news and overall very pleased with where things stand today. Objective Constitutional Well-nourished and well-hydrated in no acute distress. Vitals Time Taken: 9:59 AM, Height: 68 in, Weight: 190 lbs, BMI: 28.9, Temperature: 97.7 F, Pulse: 73 bpm, Respiratory Rate: 20 breaths/min, Blood Pressure: 153/84 mmHg. Respiratory normal breathing without difficulty. Psychiatric this patient is able to make decisions and demonstrates good insight into disease process. Alert and Oriented x 3. pleasant and cooperative. General Notes: Upon inspection patient's wound bed actually showed signs of good granulation epithelization I did perform debridement to clear away some of the necrotic debris including callus from the surface of the wound he tolerated all that today without complication and post debridement the wound bed appears to be doing much better which is great news. Integumentary (Hair, Skin) Wound #1 status is Open. Original cause of wound was Gradually Appeared. The date acquired was: 07/01/2020. The wound has been in treatment 1 weeks. The wound is located on the Right,Plantar Metatarsal head fourth. The wound measures 1cm length x 0.3cm width x 0.1cm depth; 0.236cm^2 area and 0.024cm^3 volume. There is Fat Layer (Subcutaneous Tissue) exposed. There is no tunneling  or undermining noted. There is a none present amount of drainage noted. The wound margin is thickened. There is large (67-100%) red granulation within the wound bed. There is no necrotic tissue within the wound bed. Assessment Active Problems ICD-10 Type 2 diabetes mellitus with foot ulcer Non-pressure chronic ulcer of other part of right foot with fat layer exposed Cassata, Lawrance P. ([id number]) Essential (primary) hypertension Alcohol abuse, uncomplicated Procedures Wound #1 Pre-procedure diagnosis of Wound #1 is a Diabetic Wound/Ulcer of the Lower Extremity located on the Right,Plantar Metatarsal head fourth .Severity of Tissue Pre Debridement is: Fat layer exposed. There was a Selective/Open Wound Non-Viable Tissue Debridement with a total area of 0.4 sq cm performed by Nelida MeuseStone, Otto Caraway E., PA-C. With the following instrument(s): Curette to remove Non-Viable tissue/material. Material removed includes Callus. A  time out was conducted at 10:16, prior to the start of the procedure. There was no bleeding. The procedure was tolerated well. Post Debridement Measurements: 0.6cm length x 0.1cm width x 0.1cm depth; 0.005cm^3 volume. Character of Wound/Ulcer Post Debridement is stable. Severity of Tissue Post Debridement is: Fat layer exposed. Post procedure Diagnosis Wound #1: Same as Pre-Procedure Plan Follow-up Appointments: Return Appointment in 1 week. Off-Loading: Wound #1 Right,Plantar Metatarsal head fourth: Open toe surgical shoe with peg assist. - Continue wearing WOUND #1: - Metatarsal head fourth Wound Laterality: Plantar, Right Cleanser: Normal Saline (Generic) 3 x Per Week/30 Days Discharge Instructions: Wash your hands with soap and water. Remove old dressing, discard into plastic bag and place into trash. Cleanse the wound with Normal Saline prior to applying a clean dressing using gauze sponges, not tissues or cotton balls. Do not scrub or use excessive force. Pat dry using gauze  sponges, not tissue or cotton balls. Primary Dressing: Prisma 4.34 (in) 3 x Per Week/30 Days Discharge Instructions: Moisten w/normal saline or sterile water; Cover wound as directed. Do not remove from wound bed. Secondary Dressing: Mepilex Border Flex, 4x4 (in/in) (Generic) 3 x Per Week/30 Days Discharge Instructions: Apply to wound as directed. Do not cut. 1. Would recommend at this point that we have the patient going continue with the wound care measures as before specifically with regard to the silver collagen I think that still a good option for him. 2. I am also can recommend that the patient continue to keep pressure off of this area I think he should be using the offloading shoe that we gave him he tells me he is most of the time. 3. I am also going to continue to monitor for any signs of infection though everything appears to be doing excellent at this point. We will see patient back for reevaluation in 1 week here in the clinic. If anything worsens or changes patient will contact our office for additional recommendations. Electronic Signature(s) Signed: 07/18/2020 7:31:29 PM By: Lenda Kelp PA-C Entered By: Lenda Kelp on 07/18/2020 19:31:29 Manuel [name] ([id number]) -------------------------------------------------------------------------------- SuperBill Details Patient Name: Manuel [name]. Date of Service: 07/18/2020 Medical Record Number: [id number] Patient Account Number: [id number] Date of Birth/Sex: 20-Nov-1945 (75 y.o. M) Treating RN: Rogers Blocker Primary Care Provider: Marcelino Duster Other Clinician: Lolita Cram Referring Provider: Marcelino Duster Treating Provider/Extender: Allen Derry Weeks in Treatment: 1 Diagnosis Coding ICD-10 Codes Code Description E11.621 Type 2 diabetes mellitus with foot ulcer L97.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posed I10 Essential (primary) hypertension F10.10 Alcohol abuse,  uncomplicated Facility Procedures CPT4 Code: 45625638 Description: [phone] - DEBRIDE WOUND 1ST 20 SQ CM OR < Modifier: Quantity: 1 CPT4 Code: Description: ICD-10 Diagnosis Description L97.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p Modifier: osed Quantity: Physician Procedures CPT4 Code: 2876811 Description: 97597 - WC PHYS DEBR WO ANESTH 20 SQ CM Modifier: Quantity: 1 CPT4 Code: Description: ICD-10 Diagnosis Description L97.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p Modifier: osed Quantity: Electronic Signature(s) Signed: 07/18/2020 7:32:33 PM By: Lenda Kelp PA-C Previous Signature: 07/18/2020 2:16:03 PM Version By: Lenda Kelp PA-C Previous Signature: 07/18/2020 5:07:41 PM Version By: Phillis Haggis, Dondra Prader RN Entered By: Lenda Kelp on 07/18/2020 19:32:33

## 2020-07-25 ENCOUNTER — Ambulatory Visit: Payer: Medicare Other | Admitting: Physician Assistant

## 2020-07-28 NOTE — Progress Notes (Signed)
Manuel [name], Manuel [name] ([id number]) Visit Report for 07/18/2020 Arrival Information Details Patient Name: Manuel [name], Manuel [name]. Date of Service: 07/18/2020 10:00 AM Medical Record Number: [id number] Patient Account Number: [id number] Date of Birth/Sex: 1945-09-15 (75 y.o. M) Treating RN: Dolan Amen Primary Care Mahogani Holohan: Harrel Lemon Other Clinician: Jeanine Luz Referring Jamille Fisher: Harrel Lemon Treating Kenyon Eichelberger/Extender: Skipper Cliche in Treatment: 1 Visit Information History Since Last Visit Added or deleted any medications: No Patient Arrived: Ambulatory Had a fall or experienced change in No Arrival Time: 09:55 activities of daily living that may affect Accompanied By: self risk of falls: Transfer Assistance: None Hospitalized since last visit: No Patient Identification Verified: Yes Pain Present Now: No Secondary Verification Process Completed: Yes Patient Has Alerts: Yes Patient Alerts: Type II Diabetic Electronic Signature(s) Signed: 07/19/2020 4:51:14 PM By: Jeanine Luz Entered By: Jeanine Luz on 07/18/2020 09:59:27 Manuel [name] ([id number]) -------------------------------------------------------------------------------- Clinic Level of Care Assessment Details Patient Name: Manuel [name]. Date of Service: 07/18/2020 10:00 AM Medical Record Number: [id number] Patient Account Number: [id number] Date of Birth/Sex: November 13, 1945 (75 y.o. M) Treating RN: Dolan Amen Primary Care Kala Ambriz: Harrel Lemon Other Clinician: Jeanine Luz Referring Lukisha Procida: Harrel Lemon Treating Tevita Gomer/Extender: Skipper Cliche in Treatment: 1 Clinic Level of Care Assessment Items TOOL 1 Quantity Score '[]'  - Use when EandM and Procedure is performed on INITIAL visit 0 ASSESSMENTS - Nursing Assessment / Reassessment '[]'  - General Physical Exam (combine w/ comprehensive assessment (listed just below) when performed on new 0 pt. evals) '[]'  - 0 Comprehensive  Assessment (HX, ROS, Risk Assessments, Wounds Hx, etc.) ASSESSMENTS - Wound and Skin Assessment / Reassessment '[]'  - Dermatologic / Skin Assessment (not related to wound area) 0 ASSESSMENTS - Ostomy and/or Continence Assessment and Care '[]'  - Incontinence Assessment and Management 0 '[]'  - 0 Ostomy Care Assessment and Management (repouching, etc.) PROCESS - Coordination of Care '[]'  - Simple Patient / Family Education for ongoing care 0 '[]'  - 0 Complex (extensive) Patient / Family Education for ongoing care '[]'  - 0 Staff obtains Programmer, systems, Records, Test Results / Process Orders '[]'  - 0 Staff telephones HHA, Nursing Homes / Clarify orders / etc '[]'  - 0 Routine Transfer to another Facility (non-emergent condition) '[]'  - 0 Routine Hospital Admission (non-emergent condition) '[]'  - 0 New Admissions / Biomedical engineer / Ordering NPWT, Apligraf, etc. '[]'  - 0 Emergency Hospital Admission (emergent condition) PROCESS - Special Needs '[]'  - Pediatric / Minor Patient Management 0 '[]'  - 0 Isolation Patient Management '[]'  - 0 Hearing / Language / Visual special needs '[]'  - 0 Assessment of Community assistance (transportation, D/C planning, etc.) '[]'  - 0 Additional assistance / Altered mentation '[]'  - 0 Support Surface(s) Assessment (bed, cushion, seat, etc.) INTERVENTIONS - Miscellaneous '[]'  - External ear exam 0 '[]'  - 0 Patient Transfer (multiple staff / Civil Service fast streamer / Similar devices) '[]'  - 0 Simple Staple / Suture removal (25 or less) '[]'  - 0 Complex Staple / Suture removal (26 or more) '[]'  - 0 Hypo/Hyperglycemic Management (do not check if billed separately) '[]'  - 0 Ankle / Brachial Index (ABI) - do not check if billed separately Has the patient been seen at the hospital within the last three years: Yes Total Score: 0 Level Of Care: ____ Manuel [name] ([id number]) Electronic Signature(s) Signed: 07/18/2020 5:07:41 PM By: Georges Mouse, Minus Breeding RN Entered By: Georges Mouse, Minus Breeding on  07/18/2020 10:20:35 Manuel [name] ([id number]) -------------------------------------------------------------------------------- Encounter Discharge Information Details Patient Name: Manuel [name]. Date of Service: 07/18/2020 10:00 AM Medical  Record Number: [id number] Patient Account Number: [id number] Date of Birth/Sex: 1945-05-27 (75 y.o. M) Treating RN: Carlene Coria Primary Care Jibreel Fedewa: Harrel Lemon Other Clinician: Jeanine Luz Referring Navaya Wiatrek: Harrel Lemon Treating Carylon Tamburro/Extender: Skipper Cliche in Treatment: 1 Encounter Discharge Information Items Post Procedure Vitals Discharge Condition: Stable Temperature (F): 97.7 Ambulatory Status: Ambulatory Pulse (bpm): 73 Discharge Destination: Home Respiratory Rate (breaths/min): 20 Transportation: Private Auto Blood Pressure (mmHg): 153/84 Accompanied By: self Schedule Follow-up Appointment: Yes Clinical Summary of Care: Patient Declined Electronic Signature(s) Signed: 07/28/2020 8:11:40 AM By: Carlene Coria RN Entered By: Carlene Coria on 07/18/2020 10:29:43 Manuel [name] ([id number]) -------------------------------------------------------------------------------- Lower Extremity Assessment Details Patient Name: Manuel [name]. Date of Service: 07/18/2020 10:00 AM Medical Record Number: [id number] Patient Account Number: [id number] Date of Birth/Sex: [birth year] (75 y.o. M) Treating RN: Dolan Amen Primary Care Kennon Encinas: Harrel Lemon Other Clinician: Jeanine Luz Referring Muna Demers: Harrel Lemon Treating Safi Culotta/Extender: Jeri Cos Weeks in Treatment: 1 Electronic Signature(s) Signed: 07/18/2020 5:07:41 PM By: Charlett Nose RN Signed: 07/19/2020 4:51:14 PM By: Jeanine Luz Entered By: Jeanine Luz on 07/18/2020 10:06:25 Manuel [name] ([id number]) -------------------------------------------------------------------------------- Multi Wound Chart Details Patient Name:  Manuel [name]. Date of Service: 07/18/2020 10:00 AM Medical Record Number: [id number] Patient Account Number: [id number] Date of Birth/Sex: [birth year] (75 y.o. M) Treating RN: Dolan Amen Primary Care Tanishi Nault: Harrel Lemon Other Clinician: Jeanine Luz Referring Cambry Spampinato: Harrel Lemon Treating Torrance Frech/Extender: Jeri Cos Weeks in Treatment: 1 Vital Signs Height(in): 52 Pulse(bpm): 48 Weight(lbs): 190 Blood Pressure(mmHg): 153/84 Body Mass Index(BMI): 29 Temperature(F): 97.7 Respiratory Rate(breaths/min): 20 Photos: [N/A:N/A] Wound Location: Right, Plantar Metatarsal head N/A N/A fourth Wounding Event: Gradually Appeared N/A N/A Primary Etiology: Diabetic Wound/Ulcer of the Lower N/A N/A Extremity Comorbid History: Cataracts, Hypertension, Myocardial N/A N/A Infarction, Type II Diabetes Date Acquired: 07/01/2020 N/A N/A Weeks of Treatment: 1 N/A N/A Wound Status: Open N/A N/A Measurements L x W x D (cm) 1x0.3x0.1 N/A N/A Area (cm) : 0.236 N/A N/A Volume (cm) : 0.024 N/A N/A % Reduction in Area: 76.50% N/A N/A % Reduction in Volume: 76.20% N/A N/A Classification: Grade 1 N/A N/A Exudate Amount: None Present N/A N/A Wound Margin: Thickened N/A N/A Granulation Amount: Large (67-100%) N/A N/A Granulation Quality: Red N/A N/A Necrotic Amount: None Present (0%) N/A N/A Exposed Structures: Fat Layer (Subcutaneous Tissue): N/A N/A Yes Fascia: No Tendon: No Muscle: No Joint: No Bone: No Epithelialization: None N/A N/A Treatment Notes Electronic Signature(s) Signed: 07/18/2020 5:07:41 PM By: Georges Mouse, Minus Breeding RN Entered By: Georges Mouse, Minus Breeding on 07/18/2020 10:16:41 Manuel [name] ([id number]) -------------------------------------------------------------------------------- Multi-Disciplinary Care Plan Details Patient Name: Manuel [name]. Date of Service: 07/18/2020 10:00 AM Medical Record Number: [id number] Patient Account Number:  [id number] Date of Birth/Sex: 1946-01-06 (75 y.o. M) Treating RN: Dolan Amen Primary Care Niyam Bisping: Harrel Lemon Other Clinician: Jeanine Luz Referring Charlii Yost: Harrel Lemon Treating Maurion Walkowiak/Extender: Jeri Cos Weeks in Treatment: 1 Active Inactive Wound/Skin Impairment Nursing Diagnoses: Impaired tissue integrity Goals: Patient/caregiver will verbalize understanding of skin care regimen Date Initiated: 07/11/2020 Date Inactivated: 07/18/2020 Target Resolution Date: 07/11/2020 Goal Status: Met Ulcer/skin breakdown will have a volume reduction of 30% by week 4 Date Initiated: 07/11/2020 Target Resolution Date: 08/11/2020 Goal Status: Active Ulcer/skin breakdown will have a volume reduction of 50% by week 8 Date Initiated: 07/11/2020 Target Resolution Date: 09/10/2020 Goal Status: Active Ulcer/skin breakdown will have a volume reduction of 80% by week 12 Date Initiated: 07/11/2020 Target Resolution Date: 10/11/2020 Goal Status: Active Interventions: Assess patient/caregiver ability to obtain  necessary supplies Assess patient/caregiver ability to perform ulcer/skin care regimen upon admission and as needed Assess ulceration(s) every visit Provide education on ulcer and skin care Treatment Activities: Skin care regimen initiated : 07/11/2020 Notes: Electronic Signature(s) Signed: 07/18/2020 5:07:41 PM By: Georges Mouse, Minus Breeding RN Entered By: Georges Mouse, Kenia on 07/18/2020 10:16:12 Manuel [name] ([id number]) -------------------------------------------------------------------------------- Pain Assessment Details Patient Name: Manuel [name]. Date of Service: 07/18/2020 10:00 AM Medical Record Number: [id number] Patient Account Number: [id number] Date of Birth/Sex: June 13, 1945 (75 y.o. M) Treating RN: Dolan Amen Primary Care Andersen Mckiver: Harrel Lemon Other Clinician: Jeanine Luz Referring Himmat Enberg: Harrel Lemon Treating Gurman Ashland/Extender: Skipper Cliche in Treatment: 1 Active Problems Location of Pain Severity and Description of Pain Patient Has Paino No Site Locations Rate the pain. Current Pain Level: 0 Pain Management and Medication Current Pain Management: Electronic Signature(s) Signed: 07/18/2020 5:07:41 PM By: Georges Mouse, Minus Breeding RN Signed: 07/19/2020 4:51:14 PM By: Jeanine Luz Entered By: Jeanine Luz on 07/18/2020 10:02:24 Manuel [name] ([id number]) -------------------------------------------------------------------------------- Patient/Caregiver Education Details Patient Name: Manuel [name]. Date of Service: 07/18/2020 10:00 AM Medical Record Number: [id number] Patient Account Number: [id number] Date of Birth/Gender: 12-24-1945 (75 y.o. M) Treating RN: Dolan Amen Primary Care Physician: Harrel Lemon Other Clinician: Jeanine Luz Referring Physician: Harrel Lemon Treating Physician/Extender: Skipper Cliche in Treatment: 1 Education Assessment Education Provided To: Patient Education Topics Provided Wound/Skin Impairment: Methods: Explain/Verbal Responses: State content correctly Notes continue wearing offloading shoe Electronic Signature(s) Signed: 07/18/2020 5:07:41 PM By: Georges Mouse, Minus Breeding RN Entered By: Georges Mouse, Minus Breeding on 07/18/2020 10:21:03 Manuel [name] ([id number]) -------------------------------------------------------------------------------- Wound Assessment Details Patient Name: Manuel [name]. Date of Service: 07/18/2020 10:00 AM Medical Record Number: [id number] Patient Account Number: [id number] Date of Birth/Sex: 06-10-1945 (75 y.o. M) Treating RN: Dolan Amen Primary Care Ridge Lafond: Harrel Lemon Other Clinician: Jeanine Luz Referring Nhat Hearne: Harrel Lemon Treating Aaren Atallah/Extender: Jeri Cos Weeks in Treatment: 1 Wound Status Wound Number: 1 Primary Diabetic Wound/Ulcer of the Lower Extremity Etiology: Wound Location:  Right, Plantar Metatarsal head fourth Wound Status: Open Wounding Event: Gradually Appeared Comorbid Cataracts, Hypertension, Myocardial Infarction, Type II Date Acquired: 07/01/2020 History: Diabetes Weeks Of Treatment: 1 Clustered Wound: No Photos Wound Measurements Length: (cm) 1 Width: (cm) 0.3 Depth: (cm) 0.1 Area: (cm) 0.236 Volume: (cm) 0.024 % Reduction in Area: 76.5% % Reduction in Volume: 76.2% Epithelialization: None Tunneling: No Undermining: No Wound Description Classification: Grade 1 Wound Margin: Thickened Exudate Amount: None Present Foul Odor After Cleansing: No Slough/Fibrino No Wound Bed Granulation Amount: Large (67-100%) Exposed Structure Granulation Quality: Red Fascia Exposed: No Necrotic Amount: None Present (0%) Fat Layer (Subcutaneous Tissue) Exposed: Yes Tendon Exposed: No Muscle Exposed: No Joint Exposed: No Bone Exposed: No Treatment Notes Wound #1 (Metatarsal head fourth) Wound Laterality: Plantar, Right Cleanser Normal Saline Discharge Instruction: Wash your hands with soap and water. Remove old dressing, discard into plastic bag and place into trash. Cleanse the wound with Normal Saline prior to applying a clean dressing using gauze sponges, not tissues or cotton balls. Do not scrub or use excessive force. Pat dry using gauze sponges, not tissue or cotton balls. Peri-Wound Care JESSEY, STEHLIN ([id number]) Topical Primary Dressing Prisma 4.34 (in) Discharge Instruction: Moisten w/normal saline or sterile water; Cover wound as directed. Do not remove from wound bed. Secondary Dressing Mepilex Border Flex, 4x4 (in/in) Discharge Instruction: Apply to wound as directed. Do not cut. Secured With Compression Wrap Compression Stockings Environmental education officer) Signed: 07/18/2020 5:07:41 PM By: Georges Mouse, Minus Breeding  RN Signed: 07/19/2020 4:51:14 PM By: Jeanine Luz Entered By: Jeanine Luz on 07/18/2020  10:05:43 Manuel [name] ([id number]) -------------------------------------------------------------------------------- Vitals Details Patient Name: Manuel [name]. Date of Service: 07/18/2020 10:00 AM Medical Record Number: [id number] Patient Account Number: [id number] Date of Birth/Sex: [birth year] (75 y.o. M) Treating RN: Dolan Amen Primary Care Jossue Rubenstein: Harrel Lemon Other Clinician: Jeanine Luz Referring [name] Reasner: Harrel Lemon Treating Rose-Marie Hickling/Extender: Skipper Cliche in Treatment: 1 Vital Signs Time Taken: 09:59 Temperature (F): 97.7 Height (in): 68 Pulse (bpm): 73 Weight (lbs): 190 Respiratory Rate (breaths/min): 20 Body Mass Index (BMI): 28.9 Blood Pressure (mmHg): 153/84 Reference Range: 80 - 120 mg / dl Electronic Signature(s) Signed: 07/19/2020 4:51:14 PM By: Jeanine Luz Entered By: Jeanine Luz on 07/18/2020 10:01:46

## 2020-08-01 ENCOUNTER — Other Ambulatory Visit: Payer: Self-pay

## 2020-08-01 ENCOUNTER — Encounter: Payer: Managed Care, Other (non HMO) | Attending: Physician Assistant | Admitting: Physician Assistant

## 2020-08-01 DIAGNOSIS — I10 Essential (primary) hypertension: Secondary | ICD-10-CM | POA: Diagnosis not present

## 2020-08-01 DIAGNOSIS — L97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posed: Secondary | ICD-10-CM | POA: Diagnosis not present

## 2020-08-01 DIAGNOSIS — E11621 Type 2 diabetes mellitus with foot ulcer: Secondary | ICD-10-CM | POA: Insufficient documentation

## 2020-08-01 DIAGNOSIS — L84 Corns and callosities: Secondary | ICD-10-CM | POA: Diagnosis not present

## 2020-08-01 DIAGNOSIS — L97519 Non-pressure chronic ulcer of other part of right foot with unspecified severity: Secondary | ICD-10-CM | POA: Diagnosis present

## 2020-08-01 NOTE — Progress Notes (Addendum)
ONEAL, SCHOENBERGER ([id number]) Visit Report for 08/01/2020 Arrival Information Details Patient Name: Manuel [name], Manuel [name]. Date of Service: 08/01/2020 10:00 AM Medical Record Number: [id number] Patient Account Number: [id number] Date of Birth/Sex: 30-May-1945 (75 y.o. M) Treating RN: Rogers Blocker Primary Care [place] Mielke: Marcelino Duster Other Clinician: Lolita Cram Referring Ellena Kamen: Marcelino Duster Treating Christopherjohn Schiele/Extender: Rowan Blase in Treatment: 3 Visit Information History Since Last Visit Added or deleted any medications: No Patient Arrived: Ambulatory Had a fall or experienced change in No Arrival Time: 10:00 activities of daily living that may affect Accompanied By: self risk of falls: Transfer Assistance: None Hospitalized since last visit: No Patient Identification Verified: Yes Pain Present Now: Yes Secondary Verification Process Completed: Yes Patient Has Alerts: Yes Patient Alerts: Type II Diabetic Electronic Signature(s) Signed: 08/01/2020 4:38:50 PM By: Lolita Cram Entered By: Lolita Cram on 08/01/2020 10:03:35 Manuel [name] ([id number]) -------------------------------------------------------------------------------- Clinic Level of Care Assessment Details Patient Name: Manuel [name]. Date of Service: 08/01/2020 10:00 AM Medical Record Number: [id number] Patient Account Number: [id number] Date of Birth/Sex: [birth year] (75 y.o. M) Treating RN: Rogers Blocker Primary Care Kebin Maye: Marcelino Duster Other Clinician: Lolita Cram Referring Keiasia Christianson: Marcelino Duster Treating Glenetta Kiger/Extender: Rowan Blase in Treatment: 3 Clinic Level of Care Assessment Items TOOL 1 Quantity Score []  - Use when EandM and Procedure is performed on INITIAL visit 0 ASSESSMENTS - Nursing Assessment / Reassessment []  - General Physical Exam (combine w/ comprehensive assessment (listed just below) when performed on new 0 pt. evals) []  - 0 Comprehensive  Assessment (HX, ROS, Risk Assessments, Wounds Hx, etc.) ASSESSMENTS - Wound and Skin Assessment / Reassessment []  - Dermatologic / Skin Assessment (not related to wound area) 0 ASSESSMENTS - Ostomy and/or Continence Assessment and Care []  - Incontinence Assessment and Management 0 []  - 0 Ostomy Care Assessment and Management (repouching, etc.) PROCESS - Coordination of Care []  - Simple Patient / Family Education for ongoing care 0 []  - 0 Complex (extensive) Patient / Family Education for ongoing care []  - 0 Staff obtains , Records, Test Results / Process Orders []  - 0 Staff telephones HHA, Nursing Homes / Clarify orders / etc []  - 0 Routine Transfer to another Facility (non-emergent condition) []  - 0 Routine Hospital Admission (non-emergent condition) []  - 0 New Admissions / / Ordering NPWT, Apligraf, etc. []  - 0 Emergency Hospital Admission (emergent condition) PROCESS - Special Needs []  - Pediatric / Minor Patient Management 0 []  - 0 Isolation Patient Management []  - 0 Hearing / Language / Visual special needs []  - 0 Assessment of Community assistance (transportation, D/C planning, etc.) []  - 0 Additional assistance / Altered mentation []  - 0 Support Surface(s) Assessment (bed, cushion, seat, etc.) INTERVENTIONS - Miscellaneous []  - External ear exam 0 []  - 0 Patient Transfer (multiple staff / / Similar devices) []  - 0 Simple Staple / Suture removal (25 or less) []  - 0 Complex Staple / Suture removal (26 or more) []  - 0 Hypo/Hyperglycemic Management (do not check if billed separately) []  - 0 Ankle / Brachial Index (ABI) - do not check if billed separately Has the patient been seen at the hospital within the last three years: Yes Total Score: 0 Level Of Care: ____ ( ) Electronic Signature(s) Signed: 08/01/2020 4:59:45 PM By: , RN Entered By: , Kenia on  08/01/2020 11:11:55 ( ) -------------------------------------------------------------------------------- Encounter Discharge Information Details Patient Name: . Date of Service: 08/01/2020 10:00 AM Medical  Record Number: [id number] Patient Account Number: [id number] Date of Birth/Sex: 06/25/1945 (75 y.o. M) Treating RN: Rogers Blocker Primary Care Walker Sitar: Marcelino Duster Other Clinician: Lolita Cram Referring Aleea Hendry: Marcelino Duster Treating Uno Esau/Extender: Rowan Blase in Treatment: 3 Encounter Discharge Information Items Discharge Condition: Stable Ambulatory Status: Ambulatory Discharge Destination: Home Transportation: Private Auto Accompanied By: self Schedule Follow-up Appointment: No Clinical Summary of Care: Electronic Signature(s) Signed: 08/01/2020 4:59:45 PM By: Phillis Haggis, Dondra Prader RN Entered By: Phillis Haggis, Kenia on 08/01/2020 11:13:28 Manuel [name] ([id number]) -------------------------------------------------------------------------------- Lower Extremity Assessment Details Patient Name: Manuel [name]. Date of Service: 08/01/2020 10:00 AM Medical Record Number: [id number] Patient Account Number: [id number] Date of Birth/Sex: 1945/05/07 (75 y.o. M) Treating RN: Rogers Blocker Primary Care Lyberti Thrush: Marcelino Duster Other Clinician: Lolita Cram Referring Auren Valdes: Marcelino Duster Treating Amika Tassin/Extender: Allen Derry Weeks in Treatment: 3 Electronic Signature(s) Signed: 08/01/2020 4:38:50 PM By: Lolita Cram Signed: 08/01/2020 4:59:45 PM By: Phillis Haggis, Dondra Prader RN Entered By: Lolita Cram on 08/01/2020 10:04:31 Manuel [name] ([id number]) -------------------------------------------------------------------------------- Multi Wound Chart Details Patient Name: Manuel [name]. Date of Service: 08/01/2020 10:00 AM Medical Record Number: [id number] Patient Account Number:  [id number] Date of Birth/Sex: 1946/04/21 (75 y.o. M) Treating RN: Rogers Blocker Primary Care Issiah Huffaker: Marcelino Duster Other Clinician: Lolita Cram Referring Aamilah Augenstein: Marcelino Duster Treating Sonny Anthes/Extender: Rowan Blase in Treatment: 3 Vital Signs Height(in): 68 Pulse(bpm): 81 Weight(lbs): 190 Blood Pressure(mmHg): 135/78 Body Mass Index(BMI): 29 Temperature(F): 98.1 Respiratory Rate(breaths/min): 18 Photos: [N/A:N/A] Wound Location: Right, Plantar Metatarsal head N/A N/A fourth Wounding Event: Gradually Appeared N/A N/A Primary Etiology: Diabetic Wound/Ulcer of the Lower N/A N/A Extremity Comorbid History: Cataracts, Hypertension, Myocardial N/A N/A Infarction, Type II Diabetes Date Acquired: 07/01/2020 N/A N/A Weeks of Treatment: 3 N/A N/A Wound Status: Healed - Epithelialized N/A N/A Measurements L x W x D (cm) 0x0x0 N/A N/A Area (cm) : 0 N/A N/A Volume (cm) : 0 N/A N/A % Reduction in Area: 100.00% N/A N/A % Reduction in Volume: 100.00% N/A N/A Classification: Grade 1 N/A N/A Exudate Amount: None Present N/A N/A Wound Margin: Thickened N/A N/A Granulation Amount: None Present (0%) N/A N/A Necrotic Amount: None Present (0%) N/A N/A Exposed Structures: Fascia: No N/A N/A Fat Layer (Subcutaneous Tissue): No Tendon: No Muscle: No Joint: No Bone: No Epithelialization: Large (67-100%) N/A N/A Treatment Notes Electronic Signature(s) Signed: 08/01/2020 4:59:45 PM By: Phillis Haggis, Dondra Prader RN Entered By: Phillis Haggis, Kenia on 08/01/2020 11:11:11 Manuel [name] ([id number]) -------------------------------------------------------------------------------- Multi-Disciplinary Care Plan Details Patient Name: Manuel [name]. Date of Service: 08/01/2020 10:00 AM Medical Record Number: [id number] Patient Account Number: [id number] Date of Birth/Sex: 1945-05-19 (75 y.o. M) Treating RN: Rogers Blocker Primary Care Caragh Gasper: Marcelino Duster Other  Clinician: Lolita Cram Referring Tayler Lassen: Marcelino Duster Treating Cherysh Epperly/Extender: Allen Derry Weeks in Treatment: 3 Active Inactive Electronic Signature(s) Signed: 08/01/2020 4:59:45 PM By: Phillis Haggis, Dondra Prader RN Entered By: Phillis Haggis, Dondra Prader on 08/01/2020 11:11:00 Manuel [name] ([id number]) -------------------------------------------------------------------------------- Pain Assessment Details Patient Name: Manuel [name]. Date of Service: 08/01/2020 10:00 AM Medical Record Number: [id number] Patient Account Number: [id number] Date of Birth/Sex: 10/08/1945 (75 y.o. M) Treating RN: Rogers Blocker Primary Care Antonia Jicha: Marcelino Duster Other Clinician: Lolita Cram Referring Terre Zabriskie: Marcelino Duster Treating Hayden Kihara/Extender: Rowan Blase in Treatment: 3 Active Problems Location of Pain Severity and Description of Pain Patient Has Paino Yes Site Locations Rate the pain. Current Pain Level: 1 Pain Management and Medication Current Pain Management: Electronic Signature(s) Signed: 08/01/2020 4:38:50 PM By: Lolita Cram Signed: 08/01/2020  4:59:45 PM By: Phillis Haggis, Dondra Prader RN Entered By: Lolita Cram on 08/01/2020 10:04:21 Manuel [name] ([id number]) -------------------------------------------------------------------------------- Patient/Caregiver Education Details Patient Name: Manuel [name]. Date of Service: 08/01/2020 10:00 AM Medical Record Number: [id number] Patient Account Number: [id number] Date of Birth/Gender: 1945-12-10 (74 y.o. M) Treating RN: Rogers Blocker Primary Care Physician: Marcelino Duster Other Clinician: Lolita Cram Referring Physician: Marcelino Duster Treating Physician/Extender: Rowan Blase in Treatment: 3 Education Assessment Education Provided To: Patient Education Topics Provided Notes Protect new skin Electronic Signature(s) Signed: 08/01/2020 4:59:45 PM By: Phillis Haggis, Dondra Prader RN Entered By:  Phillis Haggis, Kenia on 08/01/2020 11:12:57 Manuel [name] ([id number]) -------------------------------------------------------------------------------- Wound Assessment Details Patient Name: Manuel [name]. Date of Service: 08/01/2020 10:00 AM Medical Record Number: [id number] Patient Account Number: [id number] Date of Birth/Sex: 08/30/1945 (75 y.o. M) Treating RN: Rogers Blocker Primary Care Vernie Vinciguerra: Marcelino Duster Other Clinician: Lolita Cram Referring Reygan Heagle: Marcelino Duster Treating Edmund Rick/Extender: Allen Derry Weeks in Treatment: 3 Wound Status Wound Number: 1 Primary Diabetic Wound/Ulcer of the Lower Extremity Etiology: Wound Location: Right, Plantar Metatarsal head fourth Wound Status: Healed - Epithelialized Wounding Event: Gradually Appeared Comorbid Cataracts, Hypertension, Myocardial Infarction, Type II Date Acquired: 07/01/2020 History: Diabetes Weeks Of Treatment: 3 Clustered Wound: No Photos Wound Measurements Length: (cm) 0 Width: (cm) 0 Depth: (cm) 0 Area: (cm) 0 Volume: (cm) 0 % Reduction in Area: 100% % Reduction in Volume: 100% Epithelialization: Large (67-100%) Tunneling: No Undermining: No Wound Description Classification: Grade 1 Wound Margin: Thickened Exudate Amount: None Present Foul Odor After Cleansing: No Slough/Fibrino No Wound Bed Granulation Amount: None Present (0%) Exposed Structure Necrotic Amount: None Present (0%) Fascia Exposed: No Fat Layer (Subcutaneous Tissue) Exposed: No Tendon Exposed: No Muscle Exposed: No Joint Exposed: No Bone Exposed: No Electronic Signature(s) Signed: 08/01/2020 4:59:45 PM By: Phillis Haggis, Dondra Prader RN Entered By: Phillis Haggis, Kenia on 08/01/2020 11:10:46 Manuel [name] ([id number]) -------------------------------------------------------------------------------- Vitals Details Patient Name: Manuel [name]. Date of Service: 08/01/2020 10:00 AM Medical Record Number:  [id number] Patient Account Number: [id number] Date of Birth/Sex: 1945-05-19 (75 y.o. M) Treating RN: Rogers Blocker Primary Care Damarko Stitely: Marcelino Duster Other Clinician: Lolita Cram Referring Aalyah Mansouri: Marcelino Duster Treating Theressa Piedra/Extender: Rowan Blase in Treatment: 3 Vital Signs Time Taken: 10:00 Temperature (F): 98.1 Height (in): 68 Pulse (bpm): 81 Weight (lbs): 190 Respiratory Rate (breaths/min): 18 Body Mass Index (BMI): 28.9 Blood Pressure (mmHg): 135/78 Reference Range: 80 - 120 mg / dl Electronic Signature(s) Signed: 08/01/2020 4:38:50 PM By: Lolita Cram Entered By: Lolita Cram on 08/01/2020 10:03:55

## 2020-08-01 NOTE — Progress Notes (Addendum)
Manuel [name], Manuel P. ([id number]) Visit Report for 08/01/2020 Chief Complaint Document Details Patient Name: Manuel [name], Author P. Date of Service: 08/01/2020 10:00 AM Medical Record Number: [id number] Patient Account Number: [id number] Date of Birth/Sex: [birth year](74 y.o. M) Treating RN: Rogers BlockerSanchez, Kenia Primary Care Provider: Marcelino DusterJohnston, John Other Clinician: Lolita CramBurnette, Kyara Referring Provider: Marcelino DusterJohnston, John Treating Provider/Extender: Rowan BlaseStone, Aayra Hornbaker Weeks in Treatment: 3 Information Obtained from: Patient Chief Complaint Right foot ulcer Electronic Signature(s) Signed: 08/01/2020 9:58:53 AM By: Lenda KelpStone III, Eliah Ozawa PA-C Entered By: Lenda KelpStone III, Shannelle Alguire on 08/01/2020 09:58:53 Manuel [name], Anthonymichael P. ([id number]) -------------------------------------------------------------------------------- HPI Details Patient Name: Manuel [name], Manuel P. Date of Service: 08/01/2020 10:00 AM Medical Record Number: [id number] Patient Account Number: [id number] Date of Birth/Sex: [birth year](74 y.o. M) Treating RN: Rogers BlockerSanchez, Kenia Primary Care Provider: Marcelino DusterJohnston, John Other Clinician: Lolita CramBurnette, Kyara Referring Provider: Marcelino DusterJohnston, John Treating Provider/Extender: Rowan BlaseStone, Lief Palmatier Weeks in Treatment: 3 History of Present Illness HPI Description: 07/11/2020 upon evaluation today patient appears to be unfortunately doing somewhat poorly in regard to the wound over the plantar foot at the head of the fourth metatarsal region base of his fourth toe. Fortunately there does not appear to be any signs of infection he has been on antibiotics as prescribed by the urgent care this is great news. He does have a history of diabetes mellitus type 2, hypertension, alcohol abuse, and he tells me that really his blood pressure as well as his diabetes is not under good control. He also tells me that when he drinks more he tends to have more problems with his feet with regard to wounds and otherwise. Some of this may be just simple care for his feet or  the rather lack thereof. Nonetheless he tells me that he is definitely aware that he needs to make some changes with regard to lifestyle. The wound currently has been present for about 2 weeks. 07/18/2020 on evaluation today patient appears to be doing well with regard to his wound. Has been tolerating the dressing changes without complication. There does not appear to be any signs of active infection which is great news and overall very pleased with where things stand today. 08/01/20 upon evaluation today patient appears to be doing excellent in regard to his wound on the plantar foot. In fact this appears to be completely healed which is great news. I am extremely pleased. He did have a wound where he was cutting his toenails and caused trauma to his toe on the right foot but he tells me that he does not want me to do anything with that that that is something that will heal on its own. For that reason that was not documented otherwise and measured as the patient refused. He is good to manage this himself. Electronic Signature(s) Signed: 08/01/2020 5:49:16 PM By: Lenda KelpStone III, Dakiyah Heinke PA-C Entered By: Lenda KelpStone III, Yanis Larin on 08/01/2020 17:49:16 Lipe, Manuel GunnelsHOMAS P. ([id number]) -------------------------------------------------------------------------------- Callus Pairing Details Patient Name: Manuel [name], Manuel P. Date of Service: 08/01/2020 10:00 AM Medical Record Number: [id number] Patient Account Number: [id number] Date of Birth/Sex: [birth year](74 y.o. M) Treating RN: Rogers BlockerSanchez, Kenia Primary Care Provider: Marcelino DusterJohnston, John Other Clinician: Lolita CramBurnette, Kyara Referring Provider: Marcelino DusterJohnston, John Treating Provider/Extender: Rowan BlaseStone, Sophia Sperry Weeks in Treatment: 3 Procedure Performed for: Non-Wound Location Performed By: Physician Nelida MeuseStone, Rikki Smestad E., PA-C Post Procedure Diagnosis Same as Pre-procedure Electronic Signature(s) Signed: 08/01/2020 4:59:45 PM By: Phillis HaggisSanchez Pereyda, Dondra PraderKenia RN Entered By: Phillis HaggisSanchez Pereyda, Dondra PraderKenia on  08/01/2020 11:11:26 Manuel [name], Manuel P. ([id number]) -------------------------------------------------------------------------------- Physical Exam Details Patient Name: Manuel [name], Manuel P. Date of Service: 08/01/2020  10:00 AM Medical Record Number: [id number] Patient Account Number: [id number] Date of Birth/Sex: Feb 25, 1946 (75 y.o. M) Treating RN: Rogers Blocker Primary Care Provider: Marcelino Duster Other Clinician: Lolita Cram Referring Provider: Marcelino Duster Treating Provider/Extender: Allen Derry Weeks in Treatment: 3 Constitutional Well-nourished and well-hydrated in no acute distress. Respiratory normal breathing without difficulty. Psychiatric this patient is able to make decisions and demonstrates good insight into disease process. Alert and Oriented x 3. pleasant and cooperative. Notes Upon inspection patient's wound bed actually showed signs of good epithelization at this point. He did have a little bit of callus that I pared away he tolerated that today without complication and post callus. There was no open wound remaining which is also news. Electronic Signature(s) Signed: 08/01/2020 5:49:30 PM By: Lenda Kelp PA-C Entered By: Lenda Kelp on 08/01/2020 17:49:30 Manuel [name] ([id number]) -------------------------------------------------------------------------------- Physician Orders Details Patient Name: Manuel [name]. Date of Service: 08/01/2020 10:00 AM Medical Record Number: [id number] Patient Account Number: [id number] Date of Birth/Sex: 21-Dec-1945 (75 y.o. M) Treating RN: Rogers Blocker Primary Care Provider: Marcelino Duster Other Clinician: Lolita Cram Referring Provider: Marcelino Duster Treating Provider/Extender: Rowan Blase in Treatment: 3 Verbal / Phone Orders: No Diagnosis Coding ICD-10 Coding Code Description E11.621 Type 2 diabetes mellitus with foot ulcer L97.512 Non-pressure chronic ulcer of other part of right foot with fat  layer exposed I10 Essential (primary) hypertension F10.10 Alcohol abuse, uncomplicated Discharge From [facility] Services o Discharge from Wound Care Center Treatment Complete Electronic Signature(s) Signed: 08/01/2020 4:59:45 PM By: Lajean Manes RN Signed: 08/01/2020 6:19:37 PM By: Lenda Kelp PA-C Entered By: Phillis Haggis, Kenia on 08/01/2020 11:11:48 Manuel [name] ([id number]) -------------------------------------------------------------------------------- Problem List Details Patient Name: Manuel [name]. Date of Service: 08/01/2020 10:00 AM Medical Record Number: [id number] Patient Account Number: [id number] Date of Birth/Sex: 01/30/1946 (75 y.o. M) Treating RN: Rogers Blocker Primary Care Provider: Marcelino Duster Other Clinician: Lolita Cram Referring Provider: Marcelino Duster Treating Provider/Extender: Allen Derry Weeks in Treatment: 3 Active Problems ICD-10 Encounter Code Description Active Date MDM Diagnosis E11.621 Type 2 diabetes mellitus with foot ulcer 07/11/2020 No Yes L97.512 Non-pressure chronic ulcer of other part of right foot with fat layer 07/11/2020 No Yes exposed I10 Essential (primary) hypertension 07/11/2020 No Yes F10.10 Alcohol abuse, uncomplicated 07/11/2020 No Yes L84 Corns and callosities 08/01/2020 No Yes Inactive Problems Resolved Problems Electronic Signature(s) Signed: 08/01/2020 4:55:16 PM By: Lenda Kelp PA-C Previous Signature: 08/01/2020 9:58:47 AM Version By: Lenda Kelp PA-C Entered By: Lenda Kelp on 08/01/2020 16:55:16 Manuel [name], Manuel [name] ([id number]) -------------------------------------------------------------------------------- Progress Note Details Patient Name: Manuel [name]. Date of Service: 08/01/2020 10:00 AM Medical Record Number: [id number] Patient Account Number: [id number] Date of Birth/Sex: 07-16-1945 (75 y.o. M) Treating RN: Rogers Blocker Primary Care Provider: Marcelino Duster Other Clinician:  Lolita Cram Referring Provider: Marcelino Duster Treating Provider/Extender: Rowan Blase in Treatment: 3 Subjective Chief Complaint Information obtained from Patient Right foot ulcer History of Present Illness (HPI) 07/11/2020 upon evaluation today patient appears to be unfortunately doing somewhat poorly in regard to the wound over the plantar foot at the head of the fourth metatarsal region base of his fourth toe. Fortunately there does not appear to be any signs of infection he has been on antibiotics as prescribed by the urgent care this is great news. He does have a history of diabetes mellitus type 2, hypertension, alcohol abuse, and he tells me that really his blood pressure as well as his diabetes is not  under good control. He also tells me that when he drinks more he tends to have more problems with his feet with regard to wounds and otherwise. Some of this may be just simple care for his feet or the rather lack thereof. Nonetheless he tells me that he is definitely aware that he needs to make some changes with regard to lifestyle. The wound currently has been present for about 2 weeks. 07/18/2020 on evaluation today patient appears to be doing well with regard to his wound. Has been tolerating the dressing changes without complication. There does not appear to be any signs of active infection which is great news and overall very pleased with where things stand today. 08/01/20 upon evaluation today patient appears to be doing excellent in regard to his wound on the plantar foot. In fact this appears to be completely healed which is great news. I am extremely pleased. He did have a wound where he was cutting his toenails and caused trauma to his toe on the right foot but he tells me that he does not want me to do anything with that that that is something that will heal on its own. For that reason that was not documented otherwise and measured as the patient refused. He is good to  manage this himself. Objective Constitutional Well-nourished and well-hydrated in no acute distress. Vitals Time Taken: 10:00 AM, Height: 68 in, Weight: 190 lbs, BMI: 28.9, Temperature: 98.1 F, Pulse: 81 bpm, Respiratory Rate: 18 breaths/min, Blood Pressure: 135/78 mmHg. Respiratory normal breathing without difficulty. Psychiatric this patient is able to make decisions and demonstrates good insight into disease process. Alert and Oriented x 3. pleasant and cooperative. General Notes: Upon inspection patient's wound bed actually showed signs of good epithelization at this point. He did have a little bit of callus that I pared away he tolerated that today without complication and post callus. There was no open wound remaining which is also news. Integumentary (Hair, Skin) Wound #1 status is Healed - Epithelialized. Original cause of wound was Gradually Appeared. The date acquired was: 07/01/2020. The wound has been in treatment 3 weeks. The wound is located on the Right,Plantar Metatarsal head fourth. The wound measures 0cm length x 0cm width x 0cm depth; 0cm^2 area and 0cm^3 volume. There is no tunneling or undermining noted. There is a none present amount of drainage noted. The wound margin is thickened. There is no granulation within the wound bed. There is no necrotic tissue within the wound bed. Assessment Active Problems AAHIL, FREDIN. ([id number]) ICD-10 Type 2 diabetes mellitus with foot ulcer Non-pressure chronic ulcer of other part of right foot with fat layer exposed Essential (primary) hypertension Alcohol abuse, uncomplicated Corns and callosities Procedures A Callus Pairing procedure was performed. by Nelida Meuse., PA-C. Post procedure Diagnosis Wound #: Same as Pre-Procedure Plan Discharge From [facility] Services: Discharge from Wound Care Center Treatment Complete 1. Would recommend currently that we going to continue with the wound care measures as before and the  patient is in agreement with the plan that includes the use of appropriate offloading and protection of the plantar aspect of his foot though he will not need any dressings particularly in place. 2. Also can recommend that he continue to monitor his feet daily to make sure there is nothing worsening or changing that something that should be going on ongoing. We will see him back for follow-up visit as needed. Electronic Signature(s) Signed: 08/01/2020 5:49:59 PM By: Lenda Kelp PA-C Entered  By: Lenda Kelp on 08/01/2020 17:49:59 Manuel [name] ([id number]) -------------------------------------------------------------------------------- SuperBill Details Patient Name: Manuel [name]. Date of Service: 08/01/2020 Medical Record Number: [id number] Patient Account Number: [id number] Date of Birth/Sex: 05-23-1945 (75 y.o. M) Treating RN: Rogers Blocker Primary Care Provider: Marcelino Duster Other Clinician: Lolita Cram Referring Provider: Marcelino Duster Treating Provider/Extender: Allen Derry Weeks in Treatment: 3 Diagnosis Coding ICD-10 Codes Code Description E11.621 Type 2 diabetes mellitus with foot ulcer L97.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posed I10 Essential (primary) hypertension F10.10 Alcohol abuse, uncomplicated L84 Corns and callosities Facility Procedures CPT4 Code: 76160737 Description: 11055 - PARE BENIGN LES; SGL Modifier: Quantity: 1 CPT4 Code: Description: ICD-10 Diagnosis Description L84 Corns and callosities Modifier: Quantity: Physician Procedures CPT4 Code: 1062694 Description: 11055 - WC PHYS PARE BENIGN LES; SGL Modifier: Quantity: 1 CPT4 Code: Description: ICD-10 Diagnosis Description L84 Corns and callosities Modifier: Quantity: Electronic Signature(s) Signed: 08/01/2020 4:55:29 PM By: Lenda Kelp PA-C Entered By: Lenda Kelp on 08/01/2020 16:55:28

## 2021-03-05 ENCOUNTER — Encounter: Payer: Self-pay | Admitting: Ophthalmology

## 2021-03-12 NOTE — Discharge Instructions (Signed)

## 2021-03-14 ENCOUNTER — Other Ambulatory Visit: Payer: Self-pay

## 2021-03-14 ENCOUNTER — Ambulatory Visit: Payer: Medicare Other | Admitting: Anesthesiology

## 2021-03-14 ENCOUNTER — Ambulatory Visit
Admission: RE | Admit: 2021-03-14 | Discharge: 2021-03-14 | Disposition: A | Discharge status: Home / Self Care | Payer: Medicare Other | Source: Ambulatory Visit | Attending: Ophthalmology | Admitting: Ophthalmology

## 2021-03-14 ENCOUNTER — Encounter
Admission: RE | Disposition: A | Discharge status: Home / Self Care | Payer: Self-pay | Source: Ambulatory Visit | Attending: Ophthalmology

## 2021-03-14 ENCOUNTER — Encounter: Payer: Self-pay | Admitting: Ophthalmology

## 2021-03-14 DIAGNOSIS — I10 Essential (primary) hypertension: Secondary | ICD-10-CM | POA: Diagnosis not present

## 2021-03-14 DIAGNOSIS — E1136 Type 2 diabetes mellitus with diabetic cataract: Secondary | ICD-10-CM | POA: Insufficient documentation

## 2021-03-14 DIAGNOSIS — H2511 Age-related nuclear cataract, right eye: Secondary | ICD-10-CM | POA: Diagnosis not present

## 2021-03-14 HISTORY — DX: Presence of dental prosthetic device (complete) (partial): Z97.2

## 2021-03-14 HISTORY — PX: CATARACT EXTRACTION W/PHACO: SHX586

## 2021-03-14 LAB — GLUCOSE, CAPILLARY
Glucose-Capillary: 283 mg/dL — ABNORMAL HIGH (ref 70–99)
Glucose-Capillary: 298 mg/dL — ABNORMAL HIGH (ref 70–99)

## 2021-03-14 SURGERY — PHACOEMULSIFICATION, CATARACT, WITH IOL INSERTION
Anesthesia: Monitor Anesthesia Care | Site: Eye | Laterality: Right

## 2021-03-14 MED ORDER — TETRACAINE HCL 0.5 % OP SOLN
1.0000 [drp] | OPHTHALMIC | Status: DC | PRN
  Administered 2021-03-14 (×3): 1 [drp] via OPHTHALMIC

## 2021-03-14 MED ORDER — SIGHTPATH DOSE#1 BSS IO SOLN
INTRAOCULAR | Status: DC | PRN
  Administered 2021-03-14: 1 mL via INTRAMUSCULAR

## 2021-03-14 MED ORDER — SIGHTPATH DOSE#1 BSS IO SOLN
INTRAOCULAR | Status: DC | PRN
  Administered 2021-03-14: 15 mL

## 2021-03-14 MED ORDER — SIGHTPATH DOSE#1 NA HYALUR & NA CHOND-NA HYALUR IO KIT
PACK | INTRAOCULAR | Status: DC | PRN
  Administered 2021-03-14: 1 via OPHTHALMIC

## 2021-03-14 MED ORDER — ARMC OPHTHALMIC DILATING DROPS
1.0000 "application " | OPHTHALMIC | Status: DC | PRN
  Administered 2021-03-14 (×3): 1 via OPHTHALMIC

## 2021-03-14 MED ORDER — BRIMONIDINE TARTRATE-TIMOLOL 0.2-0.5 % OP SOLN
OPHTHALMIC | Status: DC | PRN
  Administered 2021-03-14: 1 [drp] via OPHTHALMIC

## 2021-03-14 MED ORDER — SIGHTPATH DOSE#1 BSS IO SOLN
INTRAOCULAR | Status: DC | PRN
  Administered 2021-03-14: 71 mL via OPHTHALMIC

## 2021-03-14 MED ORDER — ACETAMINOPHEN 160 MG/5ML PO SOLN: 325.0000 mg | Freq: Once | ORAL | Status: DC

## 2021-03-14 MED ORDER — ACETAMINOPHEN 325 MG PO TABS: 325.0000 mg | ORAL_TABLET | Freq: Once | ORAL | Status: DC

## 2021-03-14 MED ORDER — FENTANYL CITRATE (PF) 100 MCG/2ML IJ SOLN
INTRAMUSCULAR | Status: DC | PRN
  Administered 2021-03-14: 50 ug via INTRAVENOUS

## 2021-03-14 MED ORDER — CEFUROXIME OPHTHALMIC INJECTION 1 MG/0.1 ML
INJECTION | OPHTHALMIC | Status: DC | PRN
  Administered 2021-03-14: 0.1 mL via OPHTHALMIC

## 2021-03-14 MED ORDER — MIDAZOLAM HCL 2 MG/2ML IJ SOLN
INTRAMUSCULAR | Status: DC | PRN
  Administered 2021-03-14 (×2): 1 mg via INTRAVENOUS

## 2021-03-14 MED ORDER — NEOMYCIN-POLYMYXIN-DEXAMETH 3.5-10000-0.1 OP OINT
TOPICAL_OINTMENT | OPHTHALMIC | Status: DC | PRN
  Administered 2021-03-14: 1 via OPHTHALMIC

## 2021-03-14 MED ORDER — LACTATED RINGERS IV SOLN: INTRAVENOUS | Status: DC

## 2021-03-14 SURGICAL SUPPLY — 16 items
CANNULA ANT/CHMB 27G (MISCELLANEOUS) IMPLANT
CANNULA ANT/CHMB 27GA (MISCELLANEOUS) ×2 IMPLANT
GLOVE SRG 8 PF TXTR STRL LF DI (GLOVE) ×1 IMPLANT
GLOVE SURG ENC TEXT LTX SZ7.5 (GLOVE) ×2 IMPLANT
GLOVE SURG UNDER POLY LF SZ8 (GLOVE) ×2
GOWN STRL REUS W/ TWL LRG LVL3 (GOWN DISPOSABLE) ×2 IMPLANT
GOWN STRL REUS W/TWL LRG LVL3 (GOWN DISPOSABLE) ×4
LENS IOL TECNIS EYHANCE 14.0 (Intraocular Lens) ×1 IMPLANT
MARKER SKIN DUAL TIP RULER LAB (MISCELLANEOUS) ×2 IMPLANT
NDL FILTER BLUNT 18X1 1/2 (NEEDLE) ×2 IMPLANT
NEEDLE FILTER BLUNT 18X 1/2SAF (NEEDLE) ×2
NEEDLE FILTER BLUNT 18X1 1/2 (NEEDLE) ×2 IMPLANT
SYR 3ML LL SCALE MARK (SYRINGE) ×4 IMPLANT
SYR TB 1ML LUER SLIP (SYRINGE) ×2 IMPLANT
WATER STERILE IRR 250ML POUR (IV SOLUTION) ×2 IMPLANT
WIPE NON LINTING 3.25X3.25 (MISCELLANEOUS) ×2 IMPLANT

## 2021-03-14 NOTE — Op Note (Signed)
LOCATION:  [facility]   PREOPERATIVE DIAGNOSIS:    Nuclear sclerotic cataract right eye. H25.11   POSTOPERATIVE DIAGNOSIS:  Nuclear sclerotic cataract right eye.     PROCEDURE:  Phacoemusification with posterior chamber intraocular lens placement of the right eye   ULTRASOUND TIME: Procedure(s) with comments: CATARACT EXTRACTION PHACO AND INTRAOCULAR LENS PLACEMENT (IOC) RIGHT DIABETIC (Right) - Diabetic 16.78 01:39.9  LENS:   Implant Name Type Inv. Item Serial No. Manufacturer Lot No. LRB No. Used Action  LENS IOL TECNIS EYHANCE 14.0 - [id number] Intraocular Lens LENS IOL TECNIS EYHANCE 14.0 [id number] JOHNSON   Right 1 Implanted         SURGEON:  Deirdre Evener, MD   ANESTHESIA:  Topical with tetracaine drops and 2% Xylocaine jelly, augmented with 1% preservative-free intracameral lidocaine.    COMPLICATIONS:  None.   DESCRIPTION OF PROCEDURE:  The patient was identified in the holding room and transported to the operating room and placed in the supine position under the operating microscope.  The right eye was identified as the operative eye and it was prepped and draped in the usual sterile ophthalmic fashion.   A 1 millimeter clear-corneal paracentesis was made at the 12:00 position.  0.5 ml of preservative-free 1% lidocaine was injected into the anterior chamber. The anterior chamber was filled with Viscoat viscoelastic.  A 2.4 millimeter keratome was used to make a near-clear corneal incision at the 9:00 position.  A curvilinear capsulorrhexis was made with a cystotome and capsulorrhexis forceps.  Balanced salt solution was used to hydrodissect and hydrodelineate the nucleus.   Phacoemulsification was then used in stop and chop fashion to remove the lens nucleus and epinucleus.  The remaining cortex was then removed using the irrigation and aspiration handpiece. Provisc was then placed into the capsular bag to distend it for lens placement.  A lens was then  injected into the capsular bag.  The remaining viscoelastic was aspirated.   Wounds were hydrated with balanced salt solution.  The anterior chamber was inflated to a physiologic pressure with balanced salt solution.  No wound leaks were noted. Cefuroxime 0.1 ml of a 10mg /ml solution was injected into the anterior chamber for a dose of 1 mg of intracameral antibiotic at the completion of the case.   Timolol and Brimonidine drops and Maxitrol ointment were applied to the eye.  The patient was taken to the recovery room in stable condition without complications of anesthesia or surgery.   Cadden Elizondo 03/14/2021, 9:11 AM

## 2021-03-14 NOTE — Anesthesia Procedure Notes (Signed)
Procedure Name: MAC Date/Time: 03/14/2021 8:46 AM Performed by: Dionne Bucy, CRNA Pre-anesthesia Checklist: Patient identified, Emergency Drugs available, Suction available, Patient being monitored and Timeout performed Patient Re-evaluated:Patient Re-evaluated prior to induction Oxygen Delivery Method: Nasal cannula Placement Confirmation: positive ETCO2

## 2021-03-14 NOTE — Anesthesia Preprocedure Evaluation (Signed)
Anesthesia Evaluation  Patient identified by MRN, date of birth, ID band Patient awake    Reviewed: Allergy & Precautions, H&P , NPO status , Patient's Chart, lab work & pertinent test results  Airway Mallampati: II  TM Distance: >3 FB Neck ROM: full    Dental no notable dental hx.    Pulmonary sleep apnea ,    Pulmonary exam normal breath sounds clear to auscultation       Cardiovascular hypertension, + CAD, + Past MI and + Cardiac Stents  Normal cardiovascular exam Rhythm:regular Rate:Normal     Neuro/Psych    GI/Hepatic   Endo/Other  diabetes, Poorly Controlled  Renal/GU      Musculoskeletal   Abdominal   Peds  Hematology   Anesthesia Other Findings   Reproductive/Obstetrics                             Anesthesia Physical Anesthesia Plan  ASA: 3  Anesthesia Plan: MAC   Post-op Pain Management:    Induction:   PONV Risk Score and Plan: 1 and Treatment may vary due to age or medical condition, TIVA and Midazolam  Airway Management Planned:   Additional Equipment:   Intra-op Plan:   Post-operative Plan:   Informed Consent: I have reviewed the patients History and Physical, chart, labs and discussed the procedure including the risks, benefits and alternatives for the proposed anesthesia with the patient or authorized representative who has indicated his/her understanding and acceptance.     Dental Advisory Given  Plan Discussed with: CRNA  Anesthesia Plan Comments:         Anesthesia Quick Evaluation

## 2021-03-14 NOTE — H&P (Signed)
[facility]   Primary Care Physician:  Gracelyn Nurse, MD Ophthalmologist: Dr. Lockie Mola  Pre-Procedure History & Physical: HPI:  Manuel [name] is a 75 y.o. male here for ophthalmic surgery.   Past Medical History:  Diagnosis Date   Allergies    Arthritis    Diabetes mellitus without complication (HCC)    Hard of hearing    Hyperlipidemia    Hypertension    Myocardial infarction [facility]) 2008   treated here at [facility] and transferred to [place] for stent placement   Neuropathy    knees down   Sleep apnea    "in the past"   Status post primary angioplasty with coronary stent    Wears dentures    full upper and lower    Past Surgical History:  Procedure Laterality Date   TONSILLECTOMY     had removed as a child    Prior to Admission medications   Medication Sig Start Date End Date Taking? Authorizing Provider  amLODipine (NORVASC) 5 MG tablet Take 10 mg by mouth daily.  12/30/16  Yes [provider]  carvedilol (COREG) 12.5 MG tablet Take 1 tablet by mouth 2 (two) times daily. 12/27/16  Yes [provider]  gabapentin (NEURONTIN) 100 MG capsule Take 100 mg by mouth at bedtime.   Yes [provider]  Ibuprofen 200 MG CAPS Take 2 tablets by mouth 3 times/day as needed-between meals & bedtime.   Yes [provider]  insulin aspart protamine- aspart (NOVOLOG MIX 70/30) (70-30) 100 UNIT/ML injection Inject 38 Units into the skin daily with breakfast.   Yes [provider]  lisinopril (PRINIVIL,ZESTRIL) 20 MG tablet Take 1 tablet by mouth daily. 08/12/16 03/14/21 Yes [provider]  metFORMIN (GLUCOPHAGE) 500 MG tablet Take by mouth daily.   Yes [provider]  pravastatin (PRAVACHOL) 40 MG tablet Take 1 tablet by mouth at bedtime. 05/10/16 03/14/21 Yes [provider]  COVID-19 mRNA vaccine, Pfizer, 30 MCG/0.3ML injection USE AS DIRECTED 03/24/20 03/24/21  Judyann Munson, MD  DULoxetine  (CYMBALTA) 30 MG capsule Take 30 mg by mouth daily. Patient not taking: Reported on 03/05/2021    [provider]  fluticasone (FLONASE) 50 MCG/ACT nasal spray Place 2 sprays into both nostrils as needed for allergies or rhinitis. Patient not taking: Reported on 03/05/2021    [provider]    Allergies as of 02/14/2021   (No Known Allergies)    Family History  Problem Relation Age of Onset   Scoliosis Mother    Heart disease Father     Social History   Socioeconomic History   Marital status: Married    Spouse name: Not on file   Number of children: Not on file   Years of education: Not on file   Highest education level: Not on file  Occupational History   Not on file  Tobacco Use   Smoking status: Never   Smokeless tobacco: Never  Vaping Use   Vaping Use: Never used  Substance and Sexual Activity   Alcohol use: Yes    Alcohol/week: 21.0 standard drinks    Types: 7 Glasses of wine, 14 Cans of beer per week    Comment: occassional   Drug use: No   Sexual activity: Not on file  Other Topics Concern   Not on file  Social History Narrative   Not on file   Social Determinants of Health   Financial Resource Strain: Not on file  Food Insecurity:  Not on file  Transportation Needs: Not on file  Physical Activity: Not on file  Stress: Not on file  Social Connections: Not on file  Intimate Partner Violence: Not on file    Review of Systems: See HPI, otherwise negative ROS  Physical Exam: BP 134/73   Pulse 68   Temp (!) 97.3 F (36.3 C) (Temporal)   Resp 18   Ht 5\' 8"  (1.727 m)   Wt 86.6 kg   SpO2 97%   BMI 29.04 kg/m  General:   Alert,  pleasant and cooperative in NAD Head:  Normocephalic and atraumatic. Lungs:  Clear to auscultation.    Heart:  Regular rate and rhythm.   Impression/Plan: is here for ophthalmic surgery.  Risks, benefits, limitations, and alternatives regarding ophthalmic surgery have been reviewed  with the patient.  Questions have been answered.  All parties agreeable.   [name], MD  03/14/2021, 8:15 AM

## 2021-03-14 NOTE — Anesthesia Postprocedure Evaluation (Signed)
Anesthesia Post Note  Patient: CORTNEY MCKINNEY  Procedure(s) Performed: CATARACT EXTRACTION PHACO AND INTRAOCULAR LENS PLACEMENT (IOC) RIGHT DIABETIC (Right: Eye)     Patient location during evaluation: PACU Anesthesia Type: MAC Level of consciousness: awake and alert and oriented Pain management: satisfactory to patient Vital Signs Assessment: post-procedure vital signs reviewed and stable Respiratory status: spontaneous breathing, nonlabored ventilation and respiratory function stable Cardiovascular status: blood pressure returned to baseline and stable Postop Assessment: Adequate PO intake and No signs of nausea or vomiting Anesthetic complications: no   No notable events documented.  Raliegh Ip

## 2021-03-14 NOTE — Transfer of Care (Signed)
Immediate Anesthesia Transfer of Care Note  Patient: Manuel [name]  Procedure(s) Performed: CATARACT EXTRACTION PHACO AND INTRAOCULAR LENS PLACEMENT (IOC) RIGHT DIABETIC (Right: Eye)  Patient Location: PACU  Anesthesia Type: MAC  Level of Consciousness: awake, alert  and patient cooperative  Airway and Oxygen Therapy: Patient Spontanous Breathing and Patient connected to supplemental oxygen  Post-op Assessment: Post-op Vital signs reviewed, Patient's Cardiovascular Status Stable, Respiratory Function Stable, Patent Airway and No signs of Nausea or vomiting  Post-op Vital Signs: Reviewed and stable  Complications: No notable events documented.

## 2021-03-19 ENCOUNTER — Other Ambulatory Visit: Payer: Self-pay

## 2021-03-19 ENCOUNTER — Encounter: Payer: Self-pay | Admitting: Ophthalmology

## 2021-03-26 NOTE — Discharge Instructions (Signed)

## 2021-03-28 ENCOUNTER — Encounter
Admission: RE | Disposition: A | Discharge status: Home / Self Care | Payer: Self-pay | Source: Ambulatory Visit | Attending: Ophthalmology

## 2021-03-28 ENCOUNTER — Encounter: Payer: Self-pay | Admitting: Ophthalmology

## 2021-03-28 ENCOUNTER — Ambulatory Visit: Payer: Medicare Other | Admitting: Anesthesiology

## 2021-03-28 ENCOUNTER — Other Ambulatory Visit: Payer: Self-pay

## 2021-03-28 ENCOUNTER — Ambulatory Visit
Admission: RE | Admit: 2021-03-28 | Discharge: 2021-03-28 | Disposition: A | Discharge status: Home / Self Care | Payer: Medicare Other | Source: Ambulatory Visit | Attending: Ophthalmology | Admitting: Ophthalmology

## 2021-03-28 DIAGNOSIS — H2512 Age-related nuclear cataract, left eye: Secondary | ICD-10-CM | POA: Insufficient documentation

## 2021-03-28 DIAGNOSIS — E1136 Type 2 diabetes mellitus with diabetic cataract: Secondary | ICD-10-CM | POA: Insufficient documentation

## 2021-03-28 HISTORY — PX: CATARACT EXTRACTION W/PHACO: SHX586

## 2021-03-28 LAB — GLUCOSE, CAPILLARY
Glucose-Capillary: 308 mg/dL — ABNORMAL HIGH (ref 70–99)
Glucose-Capillary: 261 mg/dL — ABNORMAL HIGH (ref 70–99)

## 2021-03-28 SURGERY — PHACOEMULSIFICATION, CATARACT, WITH IOL INSERTION
Anesthesia: Monitor Anesthesia Care | Site: Eye | Laterality: Left

## 2021-03-28 MED ORDER — CEFUROXIME OPHTHALMIC INJECTION 1 MG/0.1 ML
INJECTION | OPHTHALMIC | Status: DC | PRN
  Administered 2021-03-28: 0.1 mL via INTRACAMERAL

## 2021-03-28 MED ORDER — LACTATED RINGERS IV SOLN: INTRAVENOUS | Status: DC

## 2021-03-28 MED ORDER — BRIMONIDINE TARTRATE-TIMOLOL 0.2-0.5 % OP SOLN
OPHTHALMIC | Status: DC | PRN
  Administered 2021-03-28: 1 [drp] via OPHTHALMIC

## 2021-03-28 MED ORDER — ACETAMINOPHEN 10 MG/ML IV SOLN: 1000.0000 mg | Freq: Once | INTRAVENOUS | Status: DC | PRN

## 2021-03-28 MED ORDER — FENTANYL CITRATE (PF) 100 MCG/2ML IJ SOLN
INTRAMUSCULAR | Status: DC | PRN
  Administered 2021-03-28 (×2): 50 ug via INTRAVENOUS

## 2021-03-28 MED ORDER — TETRACAINE HCL 0.5 % OP SOLN
1.0000 [drp] | OPHTHALMIC | Status: DC | PRN
  Administered 2021-03-28 (×3): 1 [drp] via OPHTHALMIC

## 2021-03-28 MED ORDER — MIDAZOLAM HCL 2 MG/2ML IJ SOLN
INTRAMUSCULAR | Status: DC | PRN
  Administered 2021-03-28: 2 mg via INTRAVENOUS

## 2021-03-28 MED ORDER — SIGHTPATH DOSE#1 BSS IO SOLN
INTRAOCULAR | Status: DC | PRN
  Administered 2021-03-28: 15 mL

## 2021-03-28 MED ORDER — ONDANSETRON HCL 4 MG/2ML IJ SOLN: 4.0000 mg | Freq: Once | INTRAMUSCULAR | Status: DC | PRN

## 2021-03-28 MED ORDER — ARMC OPHTHALMIC DILATING DROPS
1.0000 "application " | OPHTHALMIC | Status: DC | PRN
  Administered 2021-03-28 (×3): 1 via OPHTHALMIC

## 2021-03-28 MED ORDER — SIGHTPATH DOSE#1 NA HYALUR & NA CHOND-NA HYALUR IO KIT
PACK | INTRAOCULAR | Status: DC | PRN
  Administered 2021-03-28: 1 via OPHTHALMIC

## 2021-03-28 MED ORDER — INSULIN LISPRO 100 UNIT/ML IJ SOLN
4.0000 [IU] | Freq: Once | INTRAMUSCULAR | Status: AC
  Administered 2021-03-28: 4 [IU] via SUBCUTANEOUS

## 2021-03-28 MED ORDER — SIGHTPATH DOSE#1 BSS IO SOLN
INTRAOCULAR | Status: DC | PRN
  Administered 2021-03-28: 78 mL via OPHTHALMIC

## 2021-03-28 MED ORDER — SIGHTPATH DOSE#1 BSS IO SOLN
INTRAOCULAR | Status: DC | PRN
  Administered 2021-03-28: 1 mL via INTRAMUSCULAR

## 2021-03-28 SURGICAL SUPPLY — 9 items
GLOVE SURG ENC TEXT LTX SZ7.5 (GLOVE) ×2 IMPLANT
LENS IOL TECNIS EYHANCE 14.0 (Intraocular Lens) ×1 IMPLANT
NDL FILTER BLUNT 18X1 1/2 (NEEDLE) ×2 IMPLANT
NEEDLE FILTER BLUNT 18X 1/2SAF (NEEDLE) ×2
NEEDLE FILTER BLUNT 18X1 1/2 (NEEDLE) ×2 IMPLANT
PACK EYE AFTER SURG (MISCELLANEOUS) ×2 IMPLANT
PACK VIT ANT 23G (MISCELLANEOUS) IMPLANT
SYR 3ML LL SCALE MARK (SYRINGE) ×3 IMPLANT
WATER STERILE IRR 250ML POUR (IV SOLUTION) ×2 IMPLANT

## 2021-03-28 NOTE — Anesthesia Postprocedure Evaluation (Signed)
Anesthesia Post Note  Patient: Manuel [name]  Procedure(s) Performed: CATARACT EXTRACTION PHACO AND INTRAOCULAR LENS PLACEMENT (IOC) LEFT DIABETIC 6.93 01:22.0 (Left: Eye)     Patient location during evaluation: PACU Anesthesia Type: MAC Level of consciousness: awake and alert Pain management: pain level controlled Vital Signs Assessment: post-procedure vital signs reviewed and stable Respiratory status: spontaneous breathing, nonlabored ventilation, respiratory function stable and patient connected to nasal cannula oxygen Cardiovascular status: stable and blood pressure returned to baseline Postop Assessment: no apparent nausea or vomiting Anesthetic complications: no   No notable events documented.  Azzan Butler A  [name]

## 2021-03-28 NOTE — H&P (Signed)
[facility]   Primary Care Physician:  Gracelyn Nurse, MD Ophthalmologist: Dr. Lockie Mola  Pre-Procedure History & Physical: HPI:  Manuel [name] is a 75 y.o. male here for ophthalmic surgery.   Past Medical History:  Diagnosis Date   Allergies    Arthritis    Diabetes mellitus without complication (HCC)    Hard of hearing    Hyperlipidemia    Hypertension    Myocardial infarction [facility]) 2008   treated here at [facility] and transferred to [place] for stent placement   Neuropathy    knees down   Sleep apnea    "in the past"   Status post primary angioplasty with coronary stent    Wears dentures    full upper and lower    Past Surgical History:  Procedure Laterality Date   CATARACT EXTRACTION W/PHACO Right 03/14/2021   Procedure: CATARACT EXTRACTION PHACO AND INTRAOCULAR LENS PLACEMENT (IOC) RIGHT DIABETIC;  Surgeon: Lockie Mola, MD;  Location: [facility];  Service: Ophthalmology;  Laterality: Right;  Diabetic 16.78 01:39.9   TONSILLECTOMY     had removed as a child    Prior to Admission medications   Medication Sig Start Date End Date Taking? Authorizing Provider  amLODipine (NORVASC) 5 MG tablet Take 10 mg by mouth daily.  12/30/16  Yes [provider]  carvedilol (COREG) 12.5 MG tablet Take 1 tablet by mouth 2 (two) times daily. 12/27/16  Yes [provider]  gabapentin (NEURONTIN) 100 MG capsule Take 100 mg by mouth at bedtime.   Yes [provider]  Ibuprofen 200 MG CAPS Take 2 tablets by mouth 3 times/day as needed-between meals & bedtime.   Yes [provider]  insulin aspart protamine- aspart (NOVOLOG MIX 70/30) (70-30) 100 UNIT/ML injection Inject 38 Units into the skin daily with breakfast.   Yes [provider]  lisinopril (PRINIVIL,ZESTRIL) 20 MG tablet Take 1 tablet by mouth daily. 08/12/16 03/28/21 Yes [provider]  metFORMIN (GLUCOPHAGE) 500 MG tablet Take by mouth daily.    Yes [provider]  pravastatin (PRAVACHOL) 40 MG tablet Take 1 tablet by mouth at bedtime. 05/10/16 03/28/21 Yes [provider]  DULoxetine (CYMBALTA) 30 MG capsule Take 30 mg by mouth daily. Patient not taking: Reported on 03/05/2021    [provider]  fluticasone (FLONASE) 50 MCG/ACT nasal spray Place 2 sprays into both nostrils as needed for allergies or rhinitis. Patient not taking: Reported on 03/05/2021    [provider]    Allergies as of 02/14/2021   (No Known Allergies)    Family History  Problem Relation Age of Onset   Scoliosis Mother    Heart disease Father     Social History   Socioeconomic History   Marital status: Married    Spouse name: Not on file   Number of children: Not on file   Years of education: Not on file   Highest education level: Not on file  Occupational History   Not on file  Tobacco Use   Smoking status: Never   Smokeless tobacco: Never  Vaping Use   Vaping Use: Never used  Substance and Sexual Activity   Alcohol use: Yes    Alcohol/week: 21.0 standard drinks    Types: 7 Glasses of wine, 14 Cans of beer per week    Comment: occassional   Drug use: No   Sexual activity: Not on file  Other Topics Concern   Not on file  Social History  Narrative   Not on file   Social Determinants of Health   Financial Resource Strain: Not on file  Food Insecurity: Not on file  Transportation Needs: Not on file  Physical Activity: Not on file  Stress: Not on file  Social Connections: Not on file  Intimate Partner Violence: Not on file    Review of Systems: See HPI, otherwise negative ROS  Physical Exam: BP 129/85   Pulse 71   Temp (!) 97.2 F (36.2 C) (Temporal)   Wt 86.2 kg   SpO2 99%   BMI 28.89 kg/m  General:   Alert,  pleasant and cooperative in NAD Head:  Normocephalic and atraumatic. Lungs:  Clear to auscultation.    Heart:  Regular rate and rhythm.   Impression/Plan: Manuel [name] is  here for ophthalmic surgery.  Risks, benefits, limitations, and alternatives regarding ophthalmic surgery have been reviewed with the patient.  Questions have been answered.  All parties agreeable.   Lockie Mola, MD  03/28/2021, 9:21 AM

## 2021-03-28 NOTE — Op Note (Signed)
OPERATIVE NOTE  Manuel [name] [id number] 03/28/2021   PREOPERATIVE DIAGNOSIS:  Nuclear sclerotic cataract left eye. H25.12   POSTOPERATIVE DIAGNOSIS:    Nuclear sclerotic cataract left eye.     PROCEDURE:  Phacoemusification with posterior chamber intraocular lens placement of the left eye  Ultrasound time: Procedure(s) with comments: CATARACT EXTRACTION PHACO AND INTRAOCULAR LENS PLACEMENT (IOC) LEFT DIABETIC 6.93 01:22.0 (Left) - Diabetic  LENS:   Implant Name Type Inv. Item Serial No. Manufacturer Lot No. LRB No. Used Action  LENS IOL TECNIS EYHANCE 14.0 - [id number] Intraocular Lens LENS IOL TECNIS EYHANCE 14.0 [id number] [id number]   Left 1 Implanted      SURGEON:  Deirdre Evener, MD   ANESTHESIA:  Topical with tetracaine drops and 2% Xylocaine jelly, augmented with 1% preservative-free intracameral lidocaine.    COMPLICATIONS:  None.   DESCRIPTION OF PROCEDURE:  The patient was identified in the holding room and transported to the operating room and placed in the supine position under the operating microscope.  The left eye was identified as the operative eye and it was prepped and draped in the usual sterile ophthalmic fashion.   A 1 millimeter clear-corneal paracentesis was made at the 1:30 position.  0.5 ml of preservative-free 1% lidocaine was injected into the anterior chamber.  The anterior chamber was filled with Viscoat viscoelastic.  A 2.4 millimeter keratome was used to make a near-clear corneal incision at the 10:30 position.  .  A curvilinear capsulorrhexis was made with a cystotome and capsulorrhexis forceps.  Balanced salt solution was used to hydrodissect and hydrodelineate the nucleus.   Phacoemulsification was then used in stop and chop fashion to remove the lens nucleus and epinucleus.  The remaining cortex was then removed using the irrigation and aspiration handpiece. Provisc was then placed into the capsular bag to distend it for lens placement.  A  lens was then injected into the capsular bag.  The remaining viscoelastic was aspirated.   Wounds were hydrated with balanced salt solution.  The anterior chamber was inflated to a physiologic pressure with balanced salt solution.  No wound leaks were noted. Cefuroxime 0.1 ml of a 10mg /ml solution was injected into the anterior chamber for a dose of 1 mg of intracameral antibiotic at the completion of the case.   Timolol and Brimonidine drops were applied to the eye.  The patient was taken to the recovery room in stable condition without complications of anesthesia or surgery.  Manuel [name] 03/28/2021, 9:45 AM

## 2021-03-28 NOTE — Anesthesia Preprocedure Evaluation (Signed)
Anesthesia Evaluation  Patient identified by MRN, date of birth, ID band Patient awake    Reviewed: Allergy & Precautions, H&P , NPO status , Patient's Chart, lab work & pertinent test results  Airway Mallampati: II  TM Distance: >3 FB Neck ROM: full    Dental no notable dental hx. (+) Upper Dentures, Lower Dentures   Pulmonary sleep apnea ,    Pulmonary exam normal breath sounds clear to auscultation       Cardiovascular hypertension, + CAD, + Past MI and + Cardiac Stents  Normal cardiovascular exam Rhythm:regular Rate:Normal     Neuro/Psych  Neuromuscular disease (Lumbar radiculopathy)    GI/Hepatic   Endo/Other  diabetes, Poorly Controlled  Renal/GU      Musculoskeletal  (+) Arthritis ,   Abdominal   Peds  Hematology   Anesthesia Other Findings   Reproductive/Obstetrics                             Anesthesia Physical  Anesthesia Plan  ASA: 3  Anesthesia Plan: MAC   Post-op Pain Management:    Induction: Intravenous  PONV Risk Score and Plan: 1 and Treatment may vary due to age or medical condition, TIVA and Midazolam  Airway Management Planned: Nasal Cannula  Additional Equipment:   Intra-op Plan:   Post-operative Plan:   Informed Consent: I have reviewed the patients History and Physical, chart, labs and discussed the procedure including the risks, benefits and alternatives for the proposed anesthesia with the patient or authorized representative who has indicated his/her understanding and acceptance.     Dental Advisory Given  Plan Discussed with: CRNA  Anesthesia Plan Comments:         Anesthesia Quick Evaluation

## 2021-03-28 NOTE — Transfer of Care (Signed)
Immediate Anesthesia Transfer of Care Note  Patient: Manuel [name]  Procedure(s) Performed: CATARACT EXTRACTION PHACO AND INTRAOCULAR LENS PLACEMENT (IOC) LEFT DIABETIC 6.93 01:22.0 (Left: Eye)  Patient Location: PACU  Anesthesia Type: MAC  Level of Consciousness: awake, alert  and patient cooperative  Airway and Oxygen Therapy: Patient Spontanous Breathing and Patient connected to supplemental oxygen  Post-op Assessment: Post-op Vital signs reviewed, Patient's Cardiovascular Status Stable, Respiratory Function Stable, Patent Airway and No signs of Nausea or vomiting  Post-op Vital Signs: Reviewed and stable  Complications: No notable events documented.

## 2021-03-29 ENCOUNTER — Encounter: Payer: Self-pay | Admitting: Ophthalmology

## 2021-04-06 ENCOUNTER — Other Ambulatory Visit (INDEPENDENT_AMBULATORY_CARE_PROVIDER_SITE_OTHER): Payer: Self-pay | Admitting: Vascular Surgery

## 2021-04-06 DIAGNOSIS — I739 Peripheral vascular disease, unspecified: Secondary | ICD-10-CM

## 2021-04-09 ENCOUNTER — Encounter (INDEPENDENT_AMBULATORY_CARE_PROVIDER_SITE_OTHER): Payer: Self-pay

## 2021-04-09 ENCOUNTER — Encounter (INDEPENDENT_AMBULATORY_CARE_PROVIDER_SITE_OTHER): Payer: Self-pay | Admitting: Vascular Surgery

## 2021-04-09 ENCOUNTER — Emergency Department: Payer: Medicare Other

## 2021-04-09 ENCOUNTER — Encounter: Payer: Self-pay | Admitting: Emergency Medicine

## 2021-04-09 ENCOUNTER — Other Ambulatory Visit: Payer: Self-pay

## 2021-04-09 ENCOUNTER — Emergency Department
Admission: EM | Admit: 2021-04-09 | Discharge: 2021-04-09 | Disposition: A | Discharge status: Home / Self Care | Payer: Medicare Other | Attending: Emergency Medicine | Admitting: Emergency Medicine

## 2021-04-09 DIAGNOSIS — I10 Essential (primary) hypertension: Secondary | ICD-10-CM | POA: Insufficient documentation

## 2021-04-09 DIAGNOSIS — Z794 Long term (current) use of insulin: Secondary | ICD-10-CM | POA: Insufficient documentation

## 2021-04-09 DIAGNOSIS — Z79899 Other long term (current) drug therapy: Secondary | ICD-10-CM | POA: Diagnosis not present

## 2021-04-09 DIAGNOSIS — Z7984 Long term (current) use of oral hypoglycemic drugs: Secondary | ICD-10-CM | POA: Diagnosis not present

## 2021-04-09 DIAGNOSIS — E119 Type 2 diabetes mellitus without complications: Secondary | ICD-10-CM | POA: Insufficient documentation

## 2021-04-09 DIAGNOSIS — G459 Transient cerebral ischemic attack, unspecified: Secondary | ICD-10-CM | POA: Diagnosis not present

## 2021-04-09 DIAGNOSIS — H538 Other visual disturbances: Secondary | ICD-10-CM | POA: Diagnosis present

## 2021-04-09 LAB — COMPREHENSIVE METABOLIC PANEL
ALT: 50 U/L — ABNORMAL HIGH (ref 0–44)
AST: 33 U/L (ref 15–41)
Albumin: 4.4 g/dL (ref 3.5–5.0)
Alkaline Phosphatase: 71 U/L (ref 38–126)
Anion gap: 6 (ref 5–15)
BUN: 14 mg/dL (ref 8–23)
CO2: 30 mmol/L (ref 22–32)
Calcium: 9.4 mg/dL (ref 8.9–10.3)
Chloride: 99 mmol/L (ref 98–111)
Creatinine, Ser: 0.76 mg/dL (ref 0.61–1.24)
GFR, Estimated: >60 mL/min (ref >60)
Glucose, Bld: 257 mg/dL — ABNORMAL HIGH (ref 70–99)
Potassium: 4.3 mmol/L (ref 3.5–5.1)
Sodium: 135 mmol/L (ref 135–145)
Total Bilirubin: 1.5 mg/dL — ABNORMAL HIGH (ref 0.3–1.2)
Total Protein: 7.7 g/dL (ref 6.5–8.1)

## 2021-04-09 LAB — CBC
HCT: 47.3 % (ref 39.0–52.0)
Hemoglobin: 16.8 g/dL (ref 13.0–17.0)
MCH: 30.8 pg (ref 26.0–34.0)
MCHC: 35.5 g/dL (ref 30.0–36.0)
MCV: 86.6 fL (ref 80.0–100.0)
Platelets: 253 10*3/uL (ref 150–400)
RBC: 5.46 MIL/uL (ref 4.22–5.81)
RDW: 12.4 % (ref 11.5–15.5)
WBC: 9.8 10*3/uL (ref 4.0–10.5)
nRBC: 0 % (ref 0.0–0.2)

## 2021-04-09 LAB — CBG MONITORING, ED: Glucose-Capillary: 245 mg/dL — ABNORMAL HIGH (ref 70–99)

## 2021-04-09 LAB — DIFFERENTIAL
Abs Immature Granulocytes: 0.02 10*3/uL (ref 0.00–0.07)
Basophils Absolute: 0.1 10*3/uL (ref 0.0–0.1)
Basophils Relative: 1 %
Eosinophils Absolute: 0.3 10*3/uL (ref 0.0–0.5)
Eosinophils Relative: 3 %
Immature Granulocytes: 0 %
Lymphocytes Relative: 28 %
Lymphs Abs: 2.7 10*3/uL (ref 0.7–4.0)
Monocytes Absolute: 0.9 10*3/uL (ref 0.1–1.0)
Monocytes Relative: 9 %
Neutro Abs: 5.9 10*3/uL (ref 1.7–7.7)
Neutrophils Relative %: 59 %

## 2021-04-09 LAB — PROTIME-INR
INR: 1 (ref 0.8–1.2)
Prothrombin Time: 12.7 seconds (ref 11.4–15.2)

## 2021-04-09 LAB — APTT: aPTT: 29 seconds (ref 24–36)

## 2021-04-09 MED ORDER — SODIUM CHLORIDE 0.9% FLUSH: 3.0000 mL | Freq: Once | INTRAVENOUS | Status: DC

## 2021-04-09 NOTE — ED Notes (Signed)
Patient given phone to be screened by MRI.

## 2021-04-09 NOTE — ED Provider Notes (Signed)
[facility] Emergency Department Provider Note  Time seen: 2:56 PM  I have reviewed the triage vital signs and the nursing notes.   HISTORY  Chief Complaint Loss of Vision   HPI Manuel [name] is a 75 y.o. male with a past medical history of diabetes, hypertension, hyperlipidemia, prior MI with a stent, presents to the emergency department for intermittent left visual change.  According to the patient over the past 2 months he has been intermittently experiencing loss of vision in the left eye that lasted for approximately 1 to 2 hours and then resolves.  He states over the past 1 week and has been occurring on a near daily basis.  He went to the eye doctor today they were concerned for possible TIA/CVA so they sent the patient to the emergency department for further work-up.  Patient denies any weakness or numbness of any arm or leg at any time.  No slurred speech confusion or headaches.  Patient does state intermittent visual loss in the left eye only.   Past Medical History:  Diagnosis Date   Allergies    Arthritis    Diabetes mellitus without complication (HCC)    Hard of hearing    Hyperlipidemia    Hypertension    Myocardial infarction [facility]) 2008   treated here at [facility] and transferred to [facility] for stent placement   Neuropathy    knees down   Sleep apnea    "in the past"   Status post primary angioplasty with coronary stent    Wears dentures    full upper and lower    Patient Active Problem List   Diagnosis Date Noted   Lumbar radiculopathy 01/27/2017   Lumbar degenerative disc disease 01/27/2017   Lumbar spondylosis 01/27/2017   Lumbar facet arthropathy 01/27/2017    Past Surgical History:  Procedure Laterality Date   CATARACT EXTRACTION W/PHACO Right 03/14/2021   Procedure: CATARACT EXTRACTION PHACO AND INTRAOCULAR LENS PLACEMENT (IOC) RIGHT DIABETIC;  Surgeon: Lockie Mola, MD;  Location: [facility];  Service:  Ophthalmology;  Laterality: Right;  Diabetic 16.78 01:39.9   CATARACT EXTRACTION W/PHACO Left 03/28/2021   Procedure: CATARACT EXTRACTION PHACO AND INTRAOCULAR LENS PLACEMENT (IOC) LEFT DIABETIC 6.93 01:22.0;  Surgeon: Lockie Mola, MD;  Location: [facility];  Service: Ophthalmology;  Laterality: Left;  Diabetic   TONSILLECTOMY     had removed as a child    Prior to Admission medications   Medication Sig Start Date End Date Taking? Authorizing Provider  amLODipine (NORVASC) 5 MG tablet Take 10 mg by mouth daily.  12/30/16  Yes [provider]  carvedilol (COREG) 12.5 MG tablet Take 1 tablet by mouth 2 (two) times daily. 12/27/16  Yes [provider]  gabapentin (NEURONTIN) 100 MG capsule Take 100 mg by mouth at bedtime.   Yes [provider]  DULoxetine (CYMBALTA) 30 MG capsule Take 30 mg by mouth daily. Patient not taking: Reported on 03/05/2021    [provider]  fluticasone (FLONASE) 50 MCG/ACT nasal spray Place 2 sprays into both nostrils as needed for allergies or rhinitis. Patient not taking: Reported on 03/05/2021    [provider]  Ibuprofen 200 MG CAPS Take 2 tablets by mouth 3 times/day as needed-between meals & bedtime.    [provider]  insulin aspart protamine- aspart (NOVOLOG MIX 70/30) (70-30) 100 UNIT/ML injection Inject 38 Units into the skin daily with breakfast.    [provider]  lisinopril (PRINIVIL,ZESTRIL) 20 MG  tablet Take 1 tablet by mouth daily. 08/12/16 03/28/21  [provider]  metFORMIN (GLUCOPHAGE) 500 MG tablet Take by mouth daily.    [provider]  pravastatin (PRAVACHOL) 40 MG tablet Take 1 tablet by mouth at bedtime. 05/10/16 03/28/21  [provider]    No Known Allergies  Family History  Problem Relation Age of Onset   Scoliosis Mother    Heart disease Father     Social History Social History   Tobacco Use   Smoking status: Never    Smokeless tobacco: Never  Vaping Use   Vaping Use: Never used  Substance Use Topics   Alcohol use: Yes    Alcohol/week: 21.0 standard drinks    Types: 7 Glasses of wine, 14 Cans of beer per week    Comment: occassional   Drug use: No    Review of Systems Constitutional: Negative for fever. Cardiovascular: Negative for chest pain. Respiratory: Negative for shortness of breath. Gastrointestinal: Negative for abdominal pain Musculoskeletal: Negative for musculoskeletal complaints Neurological: Negative for headache All other ROS negative  ____________________________________________   PHYSICAL EXAM:  VITAL SIGNS: ED Triage Vitals  Enc Vitals Group     BP 04/09/21 1056 (!) 168/93     Pulse Rate 04/09/21 1056 72     Resp 04/09/21 1056 19     Temp 04/09/21 1056 (!) 97.5 F (36.4 C)     Temp Source 04/09/21 1056 Oral     SpO2 04/09/21 1056 96 %     Weight 04/09/21 1048 189 lb 13.1 oz (86.1 kg)     Height 04/09/21 1048 5\' 8"  (1.727 m)     Head Circumference --      Peak Flow --      Pain Score 04/09/21 1048 0     Pain Loc --      Pain Edu? --      Excl. in GC? --    Constitutional: Alert and oriented. Well appearing and in no distress. Eyes: Normal exam ENT      Head: Normocephalic and atraumatic.      Mouth/Throat: Mucous membranes are moist. Cardiovascular: Normal rate, regular rhythm. Respiratory: Normal respiratory effort without tachypnea nor retractions. Breath sounds are clear  Gastrointestinal: Soft and nontender. No distention.   Musculoskeletal: Nontender with normal range of motion in all extremities. Neurologic:  Normal speech and language. No gross focal neurologic deficits are appreciated.  No deficits appreciated on exam. Skin:  Skin is warm, dry and intact.  Psychiatric: Mood and affect are normal.  ____________________________________________    EKG  EKG viewed and interpreted by myself shows normal sinus rhythm at 69 bpm with a narrow QRS,  normal axis, normal intervals, nonspecific ST changes.  ____________________________________________    RADIOLOGY  CT head of the neck is negative. MRI is negative.  ____________________________________________   INITIAL IMPRESSION / ASSESSMENT AND PLAN / ED COURSE  Pertinent labs & imaging results that were available during my care of the patient were reviewed by me and considered in my medical decision making (see chart for details).   Patient presents emergency department for intermittent left eye visual deficits.  Currently largely at baseline.  No weakness or numbness of any arm or leg at any point.  Reassuring exam currently.  Vital signs overall reassuring.  Lab work reassuring.  CT scan of the head is negative.  We will proceed with MRI of the brain to rule out CVA.  I discussed with the patient my desire to  admit him to the hospitalist for further work-up including carotid Dopplers and echocardiogram.  Patient states he would prefer to wait for his MRI of the MRI is negative he wants to be discharged home will follow up with his doctor.  States he cannot be admitted today because he has family in town.  Patient's MRI has resulted negative for acute abnormality.  Given the reassuring work-up patient states he wishes to be discharged from the emergency department and will follow up with his doctor for further testing such as carotid Dopplers and echocardiogram.  I discussed with the patient if his symptoms were to return worsen or he has any weakness or numbness of any arm or leg he is to return immediately to the emergency department.  Patient agreeable to plan of care.  KASHON KRAYNAK was evaluated in Emergency Department on 04/09/2021 for the symptoms described in the history of present illness. He was evaluated in the context of the global COVID-19 pandemic, which necessitated consideration that the patient might be at risk for infection with the SARS-CoV-2 virus that causes  COVID-19. Institutional protocols and algorithms that pertain to the evaluation of patients at risk for COVID-19 are in a state of rapid change based on information released by regulatory bodies including the CDC and federal and state organizations. These policies and algorithms were followed during the patient's care in the ED.  ____________________________________________   FINAL CLINICAL IMPRESSION(S) / ED DIAGNOSES  TIA   Minna Antis, MD 04/09/21 1459

## 2021-04-09 NOTE — ED Triage Notes (Signed)
Sent in from eye center for work up for stroke  states he has had intermittent vision loss  sx's started about 2 months ago

## 2021-04-09 NOTE — Discharge Instructions (Addendum)
As we discussed please follow-up with your primary care doctor by calling today to inform them of your ER visit as well as to help plan for outpatient carotid ultrasound evaluation as well as an echocardiogram.  If you have any further symptoms or develop weakness or numbness of any arm or leg confusion or speech difficulties please return immediately to the emergency department for evaluation.

## 2021-05-01 ENCOUNTER — Encounter (INDEPENDENT_AMBULATORY_CARE_PROVIDER_SITE_OTHER): Payer: Self-pay | Admitting: Vascular Surgery

## 2021-05-01 ENCOUNTER — Encounter (INDEPENDENT_AMBULATORY_CARE_PROVIDER_SITE_OTHER): Payer: Self-pay

## 2021-05-04 ENCOUNTER — Encounter (INDEPENDENT_AMBULATORY_CARE_PROVIDER_SITE_OTHER): Payer: Self-pay

## 2021-05-08 ENCOUNTER — Other Ambulatory Visit (INDEPENDENT_AMBULATORY_CARE_PROVIDER_SITE_OTHER): Payer: Self-pay | Admitting: Vascular Surgery

## 2021-05-08 ENCOUNTER — Ambulatory Visit (INDEPENDENT_AMBULATORY_CARE_PROVIDER_SITE_OTHER): Payer: Medicare Other

## 2021-05-08 ENCOUNTER — Ambulatory Visit (INDEPENDENT_AMBULATORY_CARE_PROVIDER_SITE_OTHER): Payer: Medicare Other | Admitting: Vascular Surgery

## 2021-05-08 ENCOUNTER — Other Ambulatory Visit: Payer: Self-pay

## 2021-05-08 ENCOUNTER — Encounter (INDEPENDENT_AMBULATORY_CARE_PROVIDER_SITE_OTHER): Payer: Self-pay | Admitting: Vascular Surgery

## 2021-05-08 VITALS — BP 121/74 | HR 79 | Resp 16 | Ht 68.0 in | Wt 190.0 lb

## 2021-05-08 DIAGNOSIS — E119 Type 2 diabetes mellitus without complications: Secondary | ICD-10-CM

## 2021-05-08 DIAGNOSIS — Z95820 Peripheral vascular angioplasty status with implants and grafts: Secondary | ICD-10-CM | POA: Insufficient documentation

## 2021-05-08 DIAGNOSIS — G459 Transient cerebral ischemic attack, unspecified: Secondary | ICD-10-CM

## 2021-05-08 DIAGNOSIS — I739 Peripheral vascular disease, unspecified: Secondary | ICD-10-CM | POA: Insufficient documentation

## 2021-05-08 DIAGNOSIS — I7025 Atherosclerosis of native arteries of other extremities with ulceration: Secondary | ICD-10-CM

## 2021-05-08 DIAGNOSIS — I779 Disorder of arteries and arterioles, unspecified: Secondary | ICD-10-CM

## 2021-05-08 DIAGNOSIS — I6529 Occlusion and stenosis of unspecified carotid artery: Secondary | ICD-10-CM

## 2021-05-08 DIAGNOSIS — I10 Essential (primary) hypertension: Secondary | ICD-10-CM | POA: Diagnosis not present

## 2021-05-08 DIAGNOSIS — I2510 Atherosclerotic heart disease of native coronary artery without angina pectoris: Secondary | ICD-10-CM | POA: Insufficient documentation

## 2021-05-08 DIAGNOSIS — I6523 Occlusion and stenosis of bilateral carotid arteries: Secondary | ICD-10-CM | POA: Diagnosis not present

## 2021-05-08 DIAGNOSIS — I252 Old myocardial infarction: Secondary | ICD-10-CM | POA: Insufficient documentation

## 2021-05-08 DIAGNOSIS — E782 Mixed hyperlipidemia: Secondary | ICD-10-CM | POA: Diagnosis not present

## 2021-05-08 DIAGNOSIS — Z794 Long term (current) use of insulin: Secondary | ICD-10-CM

## 2021-05-08 HISTORY — DX: Disorder of arteries and arterioles, unspecified: I77.9

## 2021-05-08 NOTE — Progress Notes (Signed)
Patient ID: Manuel [name], male   DOB: October 10, 1945, 76 y.o.   MRN: [id number]  Chief Complaint  Patient presents with   New Patient (Initial Visit)    Ref Excell SeltzerBaker consult PVD    HPI Manuel Flockhomas P Gunkel is a 76 y.o. male.  I am asked to see the patient by Dr. Letitia LibraJohnston for evaluation of PAD and nonhealing ulcerations on the right foot.  This has been going on now for a couple of months with worsening of the ulcers.  He had a debridement by podiatrist of the right heel ulceration with essentially no bleeding.  He has pain that wakes him at night he has to dangle his foot.  He denies any fever or chills.  Another issue he has been having over the past several weeks to months are recurring episodes what sounds like amaurosis fugax.  These are in the left.  They generally get better after about 30 to 40 minutes.  He says he has had more episodes like this than he can count.  No right eye symptoms no arm or leg weakness or numbness.  No speech or swallowing difficulties. To evaluate his carotid arteries for TIA, carotid duplex was done today.  Both carotid arteries had very mild plaque formation and were in the 1 to 39% range bilaterally.  Both vertebral arteries were antegrade.   ABIs were done today to evaluate his perfusion.  His actual ABI pressures were in the normal range bilaterally at 1.08 on the right and 1.20 on the left, but these are likely falsely elevated due to medial calcification.  His waveforms are quite poor worse on the right than the left and his right digit pressure is only 56.     Past Medical History:  Diagnosis Date   Allergies    Arthritis    Diabetes mellitus without complication (HCC)    Hard of hearing    Hyperlipidemia    Hypertension    Myocardial infarction [facility](HCC) 2008   [facility] and transferred to [facility] for stent placement   Neuropathy    knees down   Sleep apnea    "in the past"   Status post primary angioplasty with coronary stent    Wears  dentures    full upper and lower    Past Surgical History:  Procedure Laterality Date   CATARACT EXTRACTION W/PHACO Right 03/14/2021   Procedure: CATARACT EXTRACTION PHACO AND INTRAOCULAR LENS PLACEMENT (IOC) RIGHT DIABETIC;  Surgeon: Lockie MolaBrasington, Chadwick, MD;  [facility];  Service: Ophthalmology;  Laterality: Right;  Diabetic 16.78 01:39.9   CATARACT EXTRACTION W/PHACO Left 03/28/2021   Procedure: CATARACT EXTRACTION PHACO AND INTRAOCULAR LENS PLACEMENT (IOC) LEFT DIABETIC 6.93 01:22.0;  Surgeon: Lockie MolaBrasington, Chadwick, MD;  Location: [facility];  Service: Ophthalmology;  Laterality: Left;  Diabetic   TONSILLECTOMY     had removed as a child     Family History  Problem Relation Age of Onset   Scoliosis Mother    Heart disease Father       Social History   Tobacco Use   Smoking status: Never   Smokeless tobacco: Never  Vaping Use   Vaping Use: Never used  Substance Use Topics   Alcohol use: Yes    Alcohol/week: 21.0 standard drinks    Types: 7 Glasses of wine, 14 Cans of beer per week    Comment: occassional   Drug use: No     No Known Allergies  Current Outpatient  Medications  Medication Sig Dispense Refill   amLODipine (NORVASC) 5 MG tablet Take 10 mg by mouth daily.      carvedilol (COREG) 12.5 MG tablet Take 1 tablet by mouth 2 (two) times daily.     gabapentin (NEURONTIN) 100 MG capsule Take 100 mg by mouth at bedtime.     insulin aspart protamine- aspart (NOVOLOG MIX 70/30) (70-30) 100 UNIT/ML injection Inject 32 Units into the skin 2 (two) times daily with a meal.     lisinopril (PRINIVIL,ZESTRIL) 20 MG tablet Take 1 tablet by mouth daily.     metFORMIN (GLUCOPHAGE) 500 MG tablet Take 500 mg by mouth 2 (two) times daily with a meal.     pravastatin (PRAVACHOL) 40 MG tablet Take 1 tablet by mouth at bedtime.     DULoxetine (CYMBALTA) 30 MG capsule Take 30 mg by mouth daily. (Patient not taking: Reported on 03/05/2021)     fluticasone  (FLONASE) 50 MCG/ACT nasal spray Place 2 sprays into both nostrils as needed for allergies or rhinitis. (Patient not taking: Reported on 03/05/2021)     Ibuprofen 200 MG CAPS Take 2 tablets by mouth 3 times/day as needed-between meals & bedtime. (Patient not taking: Reported on 04/09/2021)     No current facility-administered medications for this visit.      REVIEW OF SYSTEMS (Negative unless checked)  Constitutional: [] Weight loss  [] Fever  [] Chills Cardiac: [] Chest pain   [] Chest pressure   [] Palpitations   [] Shortness of breath when laying flat   [] Shortness of breath at rest   [] Shortness of breath with exertion. Vascular:  [x] Pain in legs with walking   [] Pain in legs at rest   [] Pain in legs when laying flat   [] Claudication   [] Pain in feet when walking  [x] Pain in feet at rest  [] Pain in feet when laying flat   [] History of DVT   [] Phlebitis   [] Swelling in legs   [] Varicose veins   [x] Non-healing ulcers Pulmonary:   [] Uses home oxygen   [] Productive cough   [] Hemoptysis   [] Wheeze  [] COPD   [] Asthma Neurologic:  [] Dizziness  [] Blackouts   [] Seizures   [] History of stroke   [x] History of TIA  [] Aphasia   [] Temporary blindness   [] Dysphagia   [] Weakness or numbness in arms   [] Weakness or numbness in legs Musculoskeletal:  [x] Arthritis   [] Joint swelling   [x] Joint pain   [] Low back pain Hematologic:  [] Easy bruising  [] Easy bleeding   [] Hypercoagulable state   [] Anemic  [] Hepatitis Gastrointestinal:  [] Blood in stool   [] Vomiting blood  [] Gastroesophageal reflux/heartburn   [] Abdominal pain Genitourinary:  [] Chronic kidney disease   [] Difficult urination  [] Frequent urination  [] Burning with urination   [] Hematuria Skin:  [] Rashes   [x] Ulcers   [x] Wounds Psychological:  [] History of anxiety   []  History of major depression.    Physical Exam BP 121/74 (BP Location: Right Arm)    Pulse 79    Resp 16    Ht 5\' 8"  (1.727 m)    Wt 190 lb (86.2 kg)    BMI 28.89 kg/m  Gen:  WD/WN,  NAD Head: [place]/AT, No temporalis wasting.  Ear/Nose/Throat: Hearing grossly intact, nares w/o erythema or drainage, oropharynx w/o Erythema/Exudate Eyes: Conjunctiva clear, sclera non-icteric  Neck: trachea midline.  No JVD.  Pulmonary:  Good air movement, respirations not labored, no use of accessory muscles  Cardiac: RRR, no JVD Vascular:  Vessel Right Left  Radial Palpable Palpable  PT NP 1+  DP Trace  1+   Gastrointestinal:. No masses, surgical incisions, or scars. Musculoskeletal: M/S 5/5 throughout.  Sluggish capillary refill on the right.  No deformity or atrophy. Right foot wounds currently dressed. 1+ RLE edema. Neurologic: Sensation grossly intact in extremities.  Symmetrical.  Speech is fluent. Motor exam as listed above. Psychiatric: Judgment intact, Mood & affect appropriate for pt's clinical situation. Dermatologic: No rashes or ulcers noted.  No cellulitis or open wounds.    Radiology CT Head Wo Contrast  Result Date: 04/09/2021 CLINICAL DATA:  TIA, visual loss EXAM: CT HEAD WITHOUT CONTRAST TECHNIQUE: Contiguous axial images were obtained from the base of the skull through the vertex without intravenous contrast. COMPARISON:  None. FINDINGS: Brain: Mild age related volume loss. No acute intracranial abnormality. Specifically, no hemorrhage, hydrocephalus, mass lesion, acute infarction, or significant intracranial injury. Vascular: No hyperdense vessel or unexpected calcification. Skull: No acute calvarial abnormality. Sinuses/Orbits: No acute findings Other: None IMPRESSION: No acute intracranial abnormality. Electronically Signed   By: Rolm Baptise M.D.   On: 04/09/2021 11:29   MR BRAIN WO CONTRAST  Result Date: 04/09/2021 CLINICAL DATA:  Neuro deficit, acute, stroke suspected. Intermittent vision loss. EXAM: MRI HEAD WITHOUT CONTRAST TECHNIQUE: Multiplanar, multiecho pulse sequences of the brain and surrounding structures were obtained  without intravenous contrast. COMPARISON:  Head CT 04/09/2021 FINDINGS: Brain: There is no evidence of an acute infarct, intracranial hemorrhage, mass, midline shift, or extra-axial fluid collection. There is mild generalized cerebral atrophy. No significant white matter disease is seen. Vascular: Major intracranial vascular flow voids are preserved. Skull and upper cervical spine: Unremarkable bone marrow signal para Sinuses/Orbits: Bilateral cataract extraction. Mild mucosal thickening/secretions in the left sphenoid sinus. No significant mastoid fluid. Other: None. IMPRESSION: 1. No acute intracranial abnormality. 2.  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 Electronically Signed   By: Logan Bores M.D.   On: 04/09/2021 14:52    Labs Recent Results (from the past 2160 hour(s))  Glucose, capillary     Status: Abnormal   Collection Time: 03/14/21  7:57 AM  Result Value Ref Range   Glucose-Capillary 283 (H) 70 - 99 mg/dL    Comment: Glucose reference range applies only to samples taken after fasting for at least 8 hours.  Glucose, capillary     Status: Abnormal   Collection Time: 03/14/21  9:14 AM  Result Value Ref Range   Glucose-Capillary 298 (H) 70 - 99 mg/dL    Comment: Glucose reference range applies only to samples taken after fasting for at least 8 hours.  Glucose, capillary     Status: Abnormal   Collection Time: 03/28/21  8:20 AM  Result Value Ref Range   Glucose-Capillary 308 (H) 70 - 99 mg/dL    Comment: Glucose reference range applies only to samples taken after fasting for at least 8 hours.  Glucose, capillary     Status: Abnormal   Collection Time: 03/28/21  9:48 AM  Result Value Ref Range   Glucose-Capillary 261 (H) 70 - 99 mg/dL    Comment: Glucose reference range applies only to samples taken after fasting for at least 8 hours.  CBG monitoring, ED     Status: Abnormal   Collection Time: 04/09/21 10:47 AM  Result Value Ref Range   Glucose-Capillary 245 (H) 70 - 99 mg/dL    Comment:  Glucose reference range applies only to samples taken after fasting for at least 8 hours.   Comment 1 Notify RN    Comment 2 Document  in Chart   Protime-INR     Status: None   Collection Time: 04/09/21 10:55 AM  Result Value Ref Range   Prothrombin Time 12.7 11.4 - 15.2 seconds   INR 1.0 0.8 - 1.2    Comment: (NOTE) INR goal varies based on device and disease states. Performed at [facility], [address] [place], [place] [address]   APTT     Status: None   Collection Time: 04/09/21 10:55 AM  Result Value Ref Range   aPTT 29 24 - 36 seconds    Comment: Performed at [facility], [address] [place], [place] [address]  CBC     Status: None   Collection Time: 04/09/21 10:55 AM  Result Value Ref Range   WBC 9.8 4.0 - 10.5 K/uL   RBC 5.46 4.22 - 5.81 MIL/uL   Hemoglobin 16.8 13.0 - 17.0 g/dL   HCT 92.4 26.8 - 34.1 %   MCV 86.6 80.0 - 100.0 fL   MCH 30.8 26.0 - 34.0 pg   MCHC 35.5 30.0 - 36.0 g/dL   RDW 96.2 22.9 - 79.8 %   Platelets 253 150 - 400 K/uL   nRBC 0.0 0.0 - 0.2 %    Comment: Performed at [facility], [address] [place], [place] [address]  Differential     Status: None   Collection Time: 04/09/21 10:55 AM  Result Value Ref Range   Neutrophils Relative % 59 %   Neutro Abs 5.9 1.7 - 7.7 K/uL   Lymphocytes Relative 28 %   Lymphs Abs 2.7 0.7 - 4.0 K/uL   Monocytes Relative 9 %   Monocytes Absolute 0.9 0.1 - 1.0 K/uL   Eosinophils Relative 3 %   Eosinophils Absolute 0.3 0.0 - 0.5 K/uL   Basophils Relative 1 %   Basophils Absolute 0.1 0.0 - 0.1 K/uL   Immature Granulocytes 0 %   Abs Immature Granulocytes 0.02 0.00 - 0.07 K/uL    Comment: Performed at [facility], [address] [place], [place] [address]  Comprehensive metabolic panel     Status: Abnormal   Collection Time: 04/09/21 10:55 AM  Result Value Ref Range   Sodium 135 135 - 145 mmol/L   Potassium 4.3 3.5 - 5.1 mmol/L   Chloride 99 98 - 111 mmol/L    CO2 30 22 - 32 mmol/L   Glucose, Bld 257 (H) 70 - 99 mg/dL    Comment: Glucose reference range applies only to samples taken after fasting for at least 8 hours.   BUN 14 8 - 23 mg/dL   Creatinine, Ser 8.14 0.61 - 1.24 mg/dL   Calcium 9.4 8.9 - 48.1 mg/dL   Total Protein 7.7 6.5 - 8.1 g/dL   Albumin 4.4 3.5 - 5.0 g/dL   AST 33 15 - 41 U/L   ALT 50 (H) 0 - 44 U/L   Alkaline Phosphatase 71 38 - 126 U/L   Total Bilirubin 1.5 (H) 0.3 - 1.2 mg/dL   GFR, Estimated >85 >63 mL/min    Comment: (NOTE) Calculated using the CKD-EPI Creatinine Equation (2021)    Anion gap 6 5 - 15    Comment: Performed at [facility], [address] [place], [place] [address]    Assessment/Plan:  Bilateral carotid artery stenosis carotid duplex was done today.  Both carotid arteries had very mild plaque formation and were in the 1 to 39% range bilaterally.  Both vertebral arteries were antegrade.  It is unlikely that carotid disease is the cause of his TIA although it is possible he could have a proximal left common carotid artery lesion that is embolic.  We are going to be doing a right leg angiogram on him, so it is reasonable to put a catheter into the thoracic aorta and do a single image to evaluate for proximal left common carotid artery stenosis.  If this is unrevealing, no further vascular work-up would be planned for these TIAs  Benign essential hypertension blood pressure control important in reducing the progression of atherosclerotic disease. On appropriate oral medications.   Diabetes mellitus type 2, insulin dependent (HCC) blood glucose control important in reducing the progression of atherosclerotic disease. Also, involved in wound healing. On appropriate medications.   Mixed hyperlipidemia lipid control important in reducing the progression of atherosclerotic disease. Continue statin therapy   Atherosclerosis of native arteries of the extremities with ulceration (HCC) ABIs were  done today to evaluate his perfusion.  His actual ABI pressures were in the normal range bilaterally at 1.08 on the right and 1.20 on the left, but these are likely falsely elevated due to medial calcification.  His waveforms are quite poor worse on the right than the left and his right digit pressure is only 56.     Recommend:  The patient has evidence of severe atherosclerotic changes of both lower extremities associated with ulceration and tissue loss of the foot.  This represents a limb threatening ischemia and places the patient at the risk for limb loss.  He also has severe rest pain and is quite miserable.  Patient should undergo angiography of the right lower extremity with the hope for intervention for limb salvage.  The risks and benefits as well as the alternative therapies was discussed in detail with the patient.  All questions were answered.  Patient agrees to proceed with angiography.  The patient will follow up with me in the office after the procedure.       Leotis Pain 05/08/2021, 3:48 PM   This note was created with Dragon medical transcription system.  Any errors from dictation are unintentional.

## 2021-05-08 NOTE — Assessment & Plan Note (Signed)
lipid control important in reducing the progression of atherosclerotic disease. Continue statin therapy  

## 2021-05-08 NOTE — Assessment & Plan Note (Signed)
ABIs were done today to evaluate his perfusion.  His actual ABI pressures were in the normal range bilaterally at 1.08 on the right and 1.20 on the left, but these are likely falsely elevated due to medial calcification.  His waveforms are quite poor worse on the right than the left and his right digit pressure is only 56.     Recommend:  The patient has evidence of severe atherosclerotic changes of both lower extremities associated with ulceration and tissue loss of the foot.  This represents a limb threatening ischemia and places the patient at the risk for limb loss.  He also has severe rest pain and is quite miserable.  Patient should undergo angiography of the right lower extremity with the hope for intervention for limb salvage.  The risks and benefits as well as the alternative therapies was discussed in detail with the patient.  All questions were answered.  Patient agrees to proceed with angiography.  The patient will follow up with me in the office after the procedure.

## 2021-05-08 NOTE — Assessment & Plan Note (Signed)
blood glucose control important in reducing the progression of atherosclerotic disease. Also, involved in wound healing. On appropriate medications.  

## 2021-05-08 NOTE — Patient Instructions (Signed)
Angiogram °An angiogram is a procedure used to examine the blood vessels. In this procedure, contrast dye is injected through a long, thin tube (catheter) into an artery. X-rays are then taken, which show if there is a blockage or problem in a blood vessel. °The catheter may be inserted in: °The groin area. This is the most common. °The fold of the arm, near the elbow. °The wrist. °Tell a health care provider about: °Any allergies you have, including allergies to medicines, shellfish, contrast dye, or iodine. °All medicines you are taking, including vitamins, herbs, eye drops, creams, and over-the-counter medicines. °Any problems you or family members have had with anesthetic medicines. °Any blood disorders you have. °Any surgeries you have had. °Any medical conditions you have or have had, including any kidney problems or kidney failure. °Whether you are pregnant or may be pregnant. °Whether you are breastfeeding. °What are the risks? °Generally, this is a safe procedure. However, problems may occur, including: °Infection. °Bleeding and bruising. °Allergic reactions to medicines or dyes, including the contrast dye used. °Damage to nearby structures or organs, including damage to blood vessels and kidney damage from the contrast dye. °Blood clots that can lead to a stroke or heart attack. °What happens before the procedure? °Staying hydrated °Follow instructions from your health care provider about hydration, which may include: °Up to 2 hours before the procedure - you may continue to drink clear liquids, such as water, clear fruit juice, black coffee, and plain tea. ° °Eating and drinking restrictions °Follow instructions from your health care provider about eating and drinking, which may include: °8 hours before the procedure - stop eating heavy meals or foods, such as meat, fried foods, or fatty foods. °6 hours before the procedure - stop eating light meals or foods, such as toast or cereal. °2 hours before the  procedure - stop drinking clear liquids. °Medicines °Ask your health care provider about: °Changing or stopping your regular medicines. This is especially important if you are taking diabetes medicines or blood thinners. °Taking medicines such as aspirin and ibuprofen. These medicines can thin your blood. Do not take these medicines unless your health care provider tells you to take them. °Taking over-the-counter medicines, vitamins, herbs, and supplements. °Surgery safety °Ask your health care provider: °How your insertion site will be marked. °What steps will be taken to help prevent infection. These may include: °Removing hair at the insertion site. °Washing skin with a germ-killing soap. °Taking antibiotic medicine. °General instructions °Do not use any products that contain nicotine or tobacco for at least 4 weeks before the procedure. These products include cigarettes, e-cigarettes, and chewing tobacco. If you need help quitting, ask your health care provider. °You may have blood samples taken. °Plan to have someone take you home from the hospital or clinic. °If you will be going home right after the procedure, plan to have someone with you for 24 hours. °What happens during the procedure? °You will lie on your back on an X-ray table. You may be strapped to the table if it is tilted. °An IV will be inserted into one of your veins. °Electrodes may be placed on your chest to monitor your heart rate during the procedure. °You will be given one or both of the following: °A medicine to help you relax (sedative). °A medicine to numb the area where the catheter will be inserted (local anesthetic). °A small incision will be made for catheter insertion. °The catheter will be inserted into an artery using a guide   wire. An X-ray procedure (fluoroscopy) will be used to help guide the catheter to the blood vessel to be examined. °A contrast dye will then be injected into the catheter, and X-rays will be taken. The contrast  will help to show where any narrowing or blockages are located in the blood vessels. You may feel flushed as the contrast dye is injected. °Tell your health care provider if you develop chest pain or trouble breathing. °After the fluoroscopy is complete, the catheter will be removed. °A bandage (dressing) will be placed over the site where the catheter was inserted. Pressure will be applied to help stop any bleeding. °The IV will be removed. °The procedure may vary among health care providers and hospitals. °What happens after the procedure? °Your blood pressure, heart rate, breathing rate, and blood oxygen level will be monitored until you leave the hospital or clinic. °You will be kept in bed lying flat for 6 hours. If the catheter was inserted through your leg, you will be instructed not to bend or cross your legs. °The insertion area and the pulse in your feet or wrist will be checked frequently. °You will be instructed to drink plenty of fluids. This will help wash the contrast dye out of your body. °You may have more blood tests and X-rays. You may also have a test that records the electrical activity of your heart (electrocardiogram, or ECG). °Do not drive for 24 hours if you were given a sedative during your procedure. °It is up to you to get the results of your procedure. Ask your health care provider, or the department that is doing the procedure, when your results will be ready. °Summary °An angiogram is a procedure used to examine the blood vessels. °Before the procedure, follow your health care provider's instructions about eating and drinking restrictions. You may be asked to stop eating and drinking several hours before the procedure. °During the procedure, contrast dye is injected through a thin tube (catheter) into an artery. X-rays are then taken. °After the procedure, you will need to drink plenty of fluids and lie flat for 6 hour. °This information is not intended to replace advice given to you  by your health care provider. Make sure you discuss any questions you have with your health care provider. °Document Revised: 10/21/2018 Document Reviewed: 10/21/2018 °Elsevier Patient Education © 2022 [employer] ° °

## 2021-05-08 NOTE — Assessment & Plan Note (Signed)
carotid duplex was done today.  Both carotid arteries had very mild plaque formation and were in the 1 to 39% range bilaterally.  Both vertebral arteries were antegrade.   It is unlikely that carotid disease is the cause of his TIA although it is possible he could have a proximal left common carotid artery lesion that is embolic.  We are going to be doing a right leg angiogram on him, so it is reasonable to put a catheter into the thoracic aorta and do a single image to evaluate for proximal left common carotid artery stenosis.  If this is unrevealing, no further vascular work-up would be planned for these TIAs

## 2021-05-08 NOTE — H&P (View-Only) (Signed)
° ° °Patient ID: Manuel [name], male   DOB: 09/07/1945, 75 y.o.   MRN: [id number] ° °Chief Complaint  °Patient presents with  ° New Patient (Initial Visit)  °  Ref Manuel [name] consult PVD  ° ° °HPI °[name] is a 75 y.o. male.  I am asked to see the patient by Dr. Johnston for evaluation of PAD and nonhealing ulcerations on the right foot.  This has been going on now for a couple of months with worsening of the ulcers.  He had a debridement by podiatrist of the right heel ulceration with essentially no bleeding.  He has pain that wakes him at night he has to dangle his foot.  He denies any fever or chills.  Another issue he has been having over the past several weeks to months are recurring episodes what sounds like amaurosis fugax.  These are in the left.  They generally get better after about 30 to 40 minutes.  He says he has had more episodes like this than he can count.  No right eye symptoms no arm or leg weakness or numbness.  No speech or swallowing difficulties. °To evaluate his carotid arteries for TIA, carotid duplex was done today.  Both carotid arteries had very mild plaque formation and were in the 1 to 39% range bilaterally.  Both vertebral arteries were antegrade.   °ABIs were done today to evaluate his perfusion.  His actual ABI pressures were in the normal range bilaterally at 1.08 on the right and 1.20 on the left, but these are likely falsely elevated due to medial calcification.  His waveforms are quite poor worse on the right than the left and his right digit pressure is only 56.   ° ° °Past Medical History:  °Diagnosis Date  ° Allergies   ° Arthritis   ° Diabetes mellitus without complication (HCC)   ° Hard of hearing   ° Hyperlipidemia   ° Hypertension   ° Myocardial infarction (HCC) 2008  ° treated here at [facility] and transferred to [facility] for stent placement  ° Neuropathy   ° knees down  ° Sleep apnea   ° "in the past"  ° Status post primary angioplasty with coronary stent   ° Wears  dentures   ° full upper and lower  ° ° °Past Surgical History:  °Procedure Laterality Date  ° CATARACT EXTRACTION W/PHACO Right 03/14/2021  ° Procedure: CATARACT EXTRACTION PHACO AND INTRAOCULAR LENS PLACEMENT (IOC) RIGHT DIABETIC;  Surgeon: Brasington, Chadwick, MD;  Location: [facility];  Service: Ophthalmology;  Laterality: Right;  Diabetic °16.78 °01:39.9  ° CATARACT EXTRACTION W/PHACO Left 03/28/2021  ° Procedure: CATARACT EXTRACTION PHACO AND INTRAOCULAR LENS PLACEMENT (IOC) LEFT DIABETIC 6.93 01:22.0;  Surgeon: Brasington, Chadwick, MD;  Location: [facility];  Service: Ophthalmology;  Laterality: Left;  Diabetic  ° TONSILLECTOMY    ° had removed as a child  ° ° ° °Family History  °Problem Relation Age of Onset  ° Scoliosis Mother   ° Heart disease Father   ° ° ° ° °Social History  ° °Tobacco Use  ° Smoking status: Never  ° Smokeless tobacco: Never  °Vaping Use  ° Vaping Use: Never used  °Substance Use Topics  ° Alcohol use: Yes  °  Alcohol/week: 21.0 standard drinks  °  Types: 7 Glasses of wine, 14 Cans of beer per week  °  Comment: occassional  ° Drug use: No  ° ° ° °No Known Allergies ° °Current Outpatient   Medications  Medication Sig Dispense Refill   amLODipine (NORVASC) 5 MG tablet Take 10 mg by mouth daily.      carvedilol (COREG) 12.5 MG tablet Take 1 tablet by mouth 2 (two) times daily.     gabapentin (NEURONTIN) 100 MG capsule Take 100 mg by mouth at bedtime.     insulin aspart protamine- aspart (NOVOLOG MIX 70/30) (70-30) 100 UNIT/ML injection Inject 32 Units into the skin 2 (two) times daily with a meal.     lisinopril (PRINIVIL,ZESTRIL) 20 MG tablet Take 1 tablet by mouth daily.     metFORMIN (GLUCOPHAGE) 500 MG tablet Take 500 mg by mouth 2 (two) times daily with a meal.     pravastatin (PRAVACHOL) 40 MG tablet Take 1 tablet by mouth at bedtime.     DULoxetine (CYMBALTA) 30 MG capsule Take 30 mg by mouth daily. (Patient not taking: Reported on 03/05/2021)     fluticasone  (FLONASE) 50 MCG/ACT nasal spray Place 2 sprays into both nostrils as needed for allergies or rhinitis. (Patient not taking: Reported on 03/05/2021)     Ibuprofen 200 MG CAPS Take 2 tablets by mouth 3 times/day as needed-between meals & bedtime. (Patient not taking: Reported on 04/09/2021)     No current facility-administered medications for this visit.      REVIEW OF SYSTEMS (Negative unless checked)  Constitutional: [] Weight loss  [] Fever  [] Chills Cardiac: [] Chest pain   [] Chest pressure   [] Palpitations   [] Shortness of breath when laying flat   [] Shortness of breath at rest   [] Shortness of breath with exertion. Vascular:  [x] Pain in legs with walking   [] Pain in legs at rest   [] Pain in legs when laying flat   [] Claudication   [] Pain in feet when walking  [x] Pain in feet at rest  [] Pain in feet when laying flat   [] History of DVT   [] Phlebitis   [] Swelling in legs   [] Varicose veins   [x] Non-healing ulcers Pulmonary:   [] Uses home oxygen   [] Productive cough   [] Hemoptysis   [] Wheeze  [] COPD   [] Asthma Neurologic:  [] Dizziness  [] Blackouts   [] Seizures   [] History of stroke   [x] History of TIA  [] Aphasia   [] Temporary blindness   [] Dysphagia   [] Weakness or numbness in arms   [] Weakness or numbness in legs Musculoskeletal:  [x] Arthritis   [] Joint swelling   [x] Joint pain   [] Low back pain Hematologic:  [] Easy bruising  [] Easy bleeding   [] Hypercoagulable state   [] Anemic  [] Hepatitis Gastrointestinal:  [] Blood in stool   [] Vomiting blood  [] Gastroesophageal reflux/heartburn   [] Abdominal pain Genitourinary:  [] Chronic kidney disease   [] Difficult urination  [] Frequent urination  [] Burning with urination   [] Hematuria Skin:  [] Rashes   [x] Ulcers   [x] Wounds Psychological:  [] History of anxiety   []  History of major depression.    Physical Exam BP 121/74 (BP Location: Right Arm)    Pulse 79    Resp 16    Ht 5\' 8"  (1.727 m)    Wt 190 lb (86.2 kg)    BMI 28.89 kg/m  Gen:  WD/WN,  NAD Head: /AT, No temporalis wasting.  Ear/Nose/Throat: Hearing grossly intact, nares w/o erythema or drainage, oropharynx w/o Erythema/Exudate Eyes: Conjunctiva clear, sclera non-icteric  Neck: trachea midline.  No JVD.  Pulmonary:  Good air movement, respirations not labored, no use of accessory muscles  Cardiac: RRR, no JVD Vascular:  Vessel Right Left  Radial Palpable Palpable  PT NP 1+  DP Trace  1+   Gastrointestinal:. No masses, surgical incisions, or scars. Musculoskeletal: M/S 5/5 throughout.  Sluggish capillary refill on the right.  No deformity or atrophy. Right foot wounds currently dressed. 1+ RLE edema. Neurologic: Sensation grossly intact in extremities.  Symmetrical.  Speech is fluent. Motor exam as listed above. Psychiatric: Judgment intact, Mood & affect appropriate for pt's clinical situation. Dermatologic: No rashes or ulcers noted.  No cellulitis or open wounds.    Radiology CT Head Wo Contrast  Result Date: 04/09/2021 CLINICAL DATA:  TIA, visual loss EXAM: CT HEAD WITHOUT CONTRAST TECHNIQUE: Contiguous axial images were obtained from the base of the skull through the vertex without intravenous contrast. COMPARISON:  None. FINDINGS: Brain: Mild age related volume loss. No acute intracranial abnormality. Specifically, no hemorrhage, hydrocephalus, mass lesion, acute infarction, or significant intracranial injury. Vascular: No hyperdense vessel or unexpected calcification. Skull: No acute calvarial abnormality. Sinuses/Orbits: No acute findings Other: None IMPRESSION: No acute intracranial abnormality. Electronically Signed   By: Rolm Baptise M.D.   On: 04/09/2021 11:29   MR BRAIN WO CONTRAST  Result Date: 04/09/2021 CLINICAL DATA:  Neuro deficit, acute, stroke suspected. Intermittent vision loss. EXAM: MRI HEAD WITHOUT CONTRAST TECHNIQUE: Multiplanar, multiecho pulse sequences of the brain and surrounding structures were obtained  without intravenous contrast. COMPARISON:  Head CT 04/09/2021 FINDINGS: Brain: There is no evidence of an acute infarct, intracranial hemorrhage, mass, midline shift, or extra-axial fluid collection. There is mild generalized cerebral atrophy. No significant white matter disease is seen. Vascular: Major intracranial vascular flow voids are preserved. Skull and upper cervical spine: Unremarkable bone marrow signal para Sinuses/Orbits: Bilateral cataract extraction. Mild mucosal thickening/secretions in the left sphenoid sinus. No significant mastoid fluid. Other: None. IMPRESSION: 1. No acute intracranial abnormality. 2.  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 Electronically Signed   By: Logan Bores M.D.   On: 04/09/2021 14:52    Labs Recent Results (from the past 2160 hour(s))  Glucose, capillary     Status: Abnormal   Collection Time: 03/14/21  7:57 AM  Result Value Ref Range   Glucose-Capillary 283 (H) 70 - 99 mg/dL    Comment: Glucose reference range applies only to samples taken after fasting for at least 8 hours.  Glucose, capillary     Status: Abnormal   Collection Time: 03/14/21  9:14 AM  Result Value Ref Range   Glucose-Capillary 298 (H) 70 - 99 mg/dL    Comment: Glucose reference range applies only to samples taken after fasting for at least 8 hours.  Glucose, capillary     Status: Abnormal   Collection Time: 03/28/21  8:20 AM  Result Value Ref Range   Glucose-Capillary 308 (H) 70 - 99 mg/dL    Comment: Glucose reference range applies only to samples taken after fasting for at least 8 hours.  Glucose, capillary     Status: Abnormal   Collection Time: 03/28/21  9:48 AM  Result Value Ref Range   Glucose-Capillary 261 (H) 70 - 99 mg/dL    Comment: Glucose reference range applies only to samples taken after fasting for at least 8 hours.  CBG monitoring, ED     Status: Abnormal   Collection Time: 04/09/21 10:47 AM  Result Value Ref Range   Glucose-Capillary 245 (H) 70 - 99 mg/dL    Comment:  Glucose reference range applies only to samples taken after fasting for at least 8 hours.   Comment 1 Notify RN    Comment 2 Document  in Chart   Protime-INR     Status: None   Collection Time: 04/09/21 10:55 AM  Result Value Ref Range   Prothrombin Time 12.7 11.4 - 15.2 seconds   INR 1.0 0.8 - 1.2    Comment: (NOTE) INR goal varies based on device and disease states. Performed at [facility], [address] [place], [place] [address]   APTT     Status: None   Collection Time: 04/09/21 10:55 AM  Result Value Ref Range   aPTT 29 24 - 36 seconds    Comment: Performed at [facility], [address] [place], [place] [address]  CBC     Status: None   Collection Time: 04/09/21 10:55 AM  Result Value Ref Range   WBC 9.8 4.0 - 10.5 K/uL   RBC 5.46 4.22 - 5.81 MIL/uL   Hemoglobin 16.8 13.0 - 17.0 g/dL   HCT 92.4 26.8 - 34.1 %   MCV 86.6 80.0 - 100.0 fL   MCH 30.8 26.0 - 34.0 pg   MCHC 35.5 30.0 - 36.0 g/dL   RDW 96.2 22.9 - 79.8 %   Platelets 253 150 - 400 K/uL   nRBC 0.0 0.0 - 0.2 %    Comment: Performed at [facility], [address] [place], [place] [address]  Differential     Status: None   Collection Time: 04/09/21 10:55 AM  Result Value Ref Range   Neutrophils Relative % 59 %   Neutro Abs 5.9 1.7 - 7.7 K/uL   Lymphocytes Relative 28 %   Lymphs Abs 2.7 0.7 - 4.0 K/uL   Monocytes Relative 9 %   Monocytes Absolute 0.9 0.1 - 1.0 K/uL   Eosinophils Relative 3 %   Eosinophils Absolute 0.3 0.0 - 0.5 K/uL   Basophils Relative 1 %   Basophils Absolute 0.1 0.0 - 0.1 K/uL   Immature Granulocytes 0 %   Abs Immature Granulocytes 0.02 0.00 - 0.07 K/uL    Comment: Performed at [facility], [address] [place], [place] [address]  Comprehensive metabolic panel     Status: Abnormal   Collection Time: 04/09/21 10:55 AM  Result Value Ref Range   Sodium 135 135 - 145 mmol/L   Potassium 4.3 3.5 - 5.1 mmol/L   Chloride 99 98 - 111 mmol/L    CO2 30 22 - 32 mmol/L   Glucose, Bld 257 (H) 70 - 99 mg/dL    Comment: Glucose reference range applies only to samples taken after fasting for at least 8 hours.   BUN 14 8 - 23 mg/dL   Creatinine, Ser 8.14 0.61 - 1.24 mg/dL   Calcium 9.4 8.9 - 48.1 mg/dL   Total Protein 7.7 6.5 - 8.1 g/dL   Albumin 4.4 3.5 - 5.0 g/dL   AST 33 15 - 41 U/L   ALT 50 (H) 0 - 44 U/L   Alkaline Phosphatase 71 38 - 126 U/L   Total Bilirubin 1.5 (H) 0.3 - 1.2 mg/dL   GFR, Estimated >85 >63 mL/min    Comment: (NOTE) Calculated using the CKD-EPI Creatinine Equation (2021)    Anion gap 6 5 - 15    Comment: Performed at [facility], [address] [place], [place] [address]    Assessment/Plan:  Bilateral carotid artery stenosis carotid duplex was done today.  Both carotid arteries had very mild plaque formation and were in the 1 to 39% range bilaterally.  Both vertebral arteries were antegrade.  It is unlikely that carotid disease is the cause of his TIA although it is possible he could have a proximal left common carotid artery lesion that is embolic.  We are going to be doing a right leg angiogram on him, so it is reasonable to put a catheter into the thoracic aorta and do a single image to evaluate for proximal left common carotid artery stenosis.  If this is unrevealing, no further vascular work-up would be planned for these TIAs  Benign essential hypertension blood pressure control important in reducing the progression of atherosclerotic disease. On appropriate oral medications.   Diabetes mellitus type 2, insulin dependent (HCC) blood glucose control important in reducing the progression of atherosclerotic disease. Also, involved in wound healing. On appropriate medications.   Mixed hyperlipidemia lipid control important in reducing the progression of atherosclerotic disease. Continue statin therapy   Atherosclerosis of native arteries of the extremities with ulceration (HCC) ABIs were  done today to evaluate his perfusion.  His actual ABI pressures were in the normal range bilaterally at 1.08 on the right and 1.20 on the left, but these are likely falsely elevated due to medial calcification.  His waveforms are quite poor worse on the right than the left and his right digit pressure is only 56.     Recommend:  The patient has evidence of severe atherosclerotic changes of both lower extremities associated with ulceration and tissue loss of the foot.  This represents a limb threatening ischemia and places the patient at the risk for limb loss.  He also has severe rest pain and is quite miserable.  Patient should undergo angiography of the right lower extremity with the hope for intervention for limb salvage.  The risks and benefits as well as the alternative therapies was discussed in detail with the patient.  All questions were answered.  Patient agrees to proceed with angiography.  The patient will follow up with me in the office after the procedure.       Leotis Pain 05/08/2021, 3:48 PM   This note was created with Dragon medical transcription system.  Any errors from dictation are unintentional.

## 2021-05-08 NOTE — Assessment & Plan Note (Signed)
blood pressure control important in reducing the progression of atherosclerotic disease. On appropriate oral medications.  

## 2021-05-10 ENCOUNTER — Encounter (INDEPENDENT_AMBULATORY_CARE_PROVIDER_SITE_OTHER): Payer: Self-pay

## 2021-05-10 ENCOUNTER — Encounter (INDEPENDENT_AMBULATORY_CARE_PROVIDER_SITE_OTHER): Payer: Self-pay | Admitting: Vascular Surgery

## 2021-05-10 ENCOUNTER — Telehealth (INDEPENDENT_AMBULATORY_CARE_PROVIDER_SITE_OTHER): Payer: Self-pay

## 2021-05-10 NOTE — Telephone Encounter (Signed)
Spoke with the patient and he is scheduled with Dr. Wyn Quaker for a right leg angio on 05/14/21 with a 10:15 am arrival time to the MM. Pre-procedure instructions were discussed and patient understood. This will be mailed.

## 2021-05-14 ENCOUNTER — Encounter: Payer: Self-pay | Admitting: Vascular Surgery

## 2021-05-14 ENCOUNTER — Ambulatory Visit
Admission: RE | Admit: 2021-05-14 | Discharge: 2021-05-14 | Disposition: A | Discharge status: Home / Self Care | Payer: Medicare Other | Source: Ambulatory Visit | Attending: Vascular Surgery | Admitting: Vascular Surgery

## 2021-05-14 ENCOUNTER — Other Ambulatory Visit (INDEPENDENT_AMBULATORY_CARE_PROVIDER_SITE_OTHER): Payer: Self-pay | Admitting: Nurse Practitioner

## 2021-05-14 ENCOUNTER — Other Ambulatory Visit: Payer: Self-pay

## 2021-05-14 ENCOUNTER — Encounter
Admission: RE | Disposition: A | Discharge status: Home / Self Care | Payer: Self-pay | Source: Ambulatory Visit | Attending: Vascular Surgery

## 2021-05-14 DIAGNOSIS — I70235 Atherosclerosis of native arteries of right leg with ulceration of other part of foot: Secondary | ICD-10-CM | POA: Diagnosis not present

## 2021-05-14 DIAGNOSIS — E11621 Type 2 diabetes mellitus with foot ulcer: Secondary | ICD-10-CM | POA: Diagnosis not present

## 2021-05-14 DIAGNOSIS — Z79899 Other long term (current) drug therapy: Secondary | ICD-10-CM | POA: Insufficient documentation

## 2021-05-14 DIAGNOSIS — Z794 Long term (current) use of insulin: Secondary | ICD-10-CM | POA: Insufficient documentation

## 2021-05-14 DIAGNOSIS — I6523 Occlusion and stenosis of bilateral carotid arteries: Secondary | ICD-10-CM | POA: Diagnosis not present

## 2021-05-14 DIAGNOSIS — E1151 Type 2 diabetes mellitus with diabetic peripheral angiopathy without gangrene: Secondary | ICD-10-CM | POA: Diagnosis present

## 2021-05-14 DIAGNOSIS — E782 Mixed hyperlipidemia: Secondary | ICD-10-CM | POA: Insufficient documentation

## 2021-05-14 DIAGNOSIS — L97519 Non-pressure chronic ulcer of other part of right foot with unspecified severity: Secondary | ICD-10-CM | POA: Diagnosis not present

## 2021-05-14 DIAGNOSIS — Z7984 Long term (current) use of oral hypoglycemic drugs: Secondary | ICD-10-CM | POA: Diagnosis not present

## 2021-05-14 DIAGNOSIS — I739 Peripheral vascular disease, unspecified: Secondary | ICD-10-CM

## 2021-05-14 DIAGNOSIS — I7025 Atherosclerosis of native arteries of other extremities with ulceration: Secondary | ICD-10-CM

## 2021-05-14 DIAGNOSIS — I70239 Atherosclerosis of native arteries of right leg with ulceration of unspecified site: Secondary | ICD-10-CM | POA: Diagnosis not present

## 2021-05-14 DIAGNOSIS — I10 Essential (primary) hypertension: Secondary | ICD-10-CM | POA: Insufficient documentation

## 2021-05-14 DIAGNOSIS — I70202 Unspecified atherosclerosis of native arteries of extremities, left leg: Secondary | ICD-10-CM | POA: Diagnosis not present

## 2021-05-14 HISTORY — PX: LOWER EXTREMITY ANGIOGRAPHY: CATH118251

## 2021-05-14 HISTORY — DX: Peripheral vascular disease, unspecified: I73.9

## 2021-05-14 LAB — CREATININE, SERUM
Creatinine, Ser: 0.93 mg/dL (ref 0.61–1.24)
GFR, Estimated: >60 mL/min (ref >60)

## 2021-05-14 LAB — BUN: BUN: 18 mg/dL (ref 8–23)

## 2021-05-14 SURGERY — LOWER EXTREMITY ANGIOGRAPHY
Anesthesia: Moderate Sedation | Site: Leg Lower | Laterality: Right

## 2021-05-14 MED ORDER — SODIUM CHLORIDE 0.9% FLUSH: 3.0000 mL | Freq: Two times a day (BID) | INTRAVENOUS | Status: DC

## 2021-05-14 MED ORDER — ONDANSETRON HCL 4 MG/2ML IJ SOLN: 4.0000 mg | Freq: Four times a day (QID) | INTRAMUSCULAR | Status: DC | PRN

## 2021-05-14 MED ORDER — FENTANYL CITRATE (PF) 100 MCG/2ML IJ SOLN
INTRAMUSCULAR | Status: AC
  Filled 2021-05-14: qty 2

## 2021-05-14 MED ORDER — HYDROMORPHONE HCL 1 MG/ML IJ SOLN: 1.0000 mg | Freq: Once | INTRAMUSCULAR | Status: DC | PRN

## 2021-05-14 MED ORDER — FENTANYL CITRATE PF 50 MCG/ML IJ SOSY
PREFILLED_SYRINGE | INTRAMUSCULAR | Status: AC
  Filled 2021-05-14: qty 1

## 2021-05-14 MED ORDER — HEPARIN SODIUM (PORCINE) 1000 UNIT/ML IJ SOLN
INTRAMUSCULAR | Status: AC
  Filled 2021-05-14: qty 10

## 2021-05-14 MED ORDER — MIDAZOLAM HCL 2 MG/2ML IJ SOLN
INTRAMUSCULAR | Status: DC | PRN
  Administered 2021-05-14 (×3): 1 mg via INTRAVENOUS
  Administered 2021-05-14: 2 mg via INTRAVENOUS

## 2021-05-14 MED ORDER — MIDAZOLAM HCL 2 MG/2ML IJ SOLN
INTRAMUSCULAR | Status: AC
  Filled 2021-05-14: qty 2

## 2021-05-14 MED ORDER — HEPARIN SODIUM (PORCINE) 1000 UNIT/ML IJ SOLN
INTRAMUSCULAR | Status: DC | PRN
  Administered 2021-05-14: 5000 [IU] via INTRAVENOUS

## 2021-05-14 MED ORDER — SODIUM CHLORIDE 0.9 % IV SOLN: INTRAVENOUS | Status: DC

## 2021-05-14 MED ORDER — FAMOTIDINE 20 MG PO TABS: 40.0000 mg | ORAL_TABLET | Freq: Once | ORAL | Status: DC | PRN

## 2021-05-14 MED ORDER — SODIUM CHLORIDE 0.9% FLUSH: 3.0000 mL | INTRAVENOUS | Status: DC | PRN

## 2021-05-14 MED ORDER — CEFAZOLIN SODIUM-DEXTROSE 2-4 GM/100ML-% IV SOLN: 2.0000 g | Freq: Once | INTRAVENOUS | Status: AC

## 2021-05-14 MED ORDER — HYDRALAZINE HCL 20 MG/ML IJ SOLN: 5.0000 mg | INTRAMUSCULAR | Status: DC | PRN

## 2021-05-14 MED ORDER — PRAVASTATIN SODIUM 40 MG PO TABS: 40.0000 mg | ORAL_TABLET | Freq: Every day | ORAL | 3 refills | Status: DC

## 2021-05-14 MED ORDER — MIDAZOLAM HCL 2 MG/ML PO SYRP: 8.0000 mg | ORAL_SOLUTION | Freq: Once | ORAL | Status: DC | PRN

## 2021-05-14 MED ORDER — SODIUM CHLORIDE 0.9 % IV SOLN: 250.0000 mL | INTRAVENOUS | Status: DC | PRN

## 2021-05-14 MED ORDER — DIPHENHYDRAMINE HCL 50 MG/ML IJ SOLN: 50.0000 mg | Freq: Once | INTRAMUSCULAR | Status: DC | PRN

## 2021-05-14 MED ORDER — CEFAZOLIN SODIUM-DEXTROSE 2-4 GM/100ML-% IV SOLN
INTRAVENOUS | Status: AC
  Administered 2021-05-14: 2 g via INTRAVENOUS
  Filled 2021-05-14: qty 100

## 2021-05-14 MED ORDER — FENTANYL CITRATE (PF) 100 MCG/2ML IJ SOLN
INTRAMUSCULAR | Status: DC | PRN
  Administered 2021-05-14: 50 ug via INTRAVENOUS
  Administered 2021-05-14 (×2): 25 ug via INTRAVENOUS
  Administered 2021-05-14: 50 ug via INTRAVENOUS
  Administered 2021-05-14: 25 ug via INTRAVENOUS

## 2021-05-14 MED ORDER — LABETALOL HCL 5 MG/ML IV SOLN: 10.0000 mg | INTRAVENOUS | Status: DC | PRN

## 2021-05-14 MED ORDER — METHYLPREDNISOLONE SODIUM SUCC 125 MG IJ SOLR: 125.0000 mg | Freq: Once | INTRAMUSCULAR | Status: DC | PRN

## 2021-05-14 MED ORDER — CLOPIDOGREL BISULFATE 75 MG PO TABS: 75.0000 mg | ORAL_TABLET | Freq: Every day | ORAL | Status: DC

## 2021-05-14 MED ORDER — ASPIRIN EC 81 MG PO TBEC: 81.0000 mg | DELAYED_RELEASE_TABLET | Freq: Every day | ORAL | Status: DC

## 2021-05-14 MED ORDER — ACETAMINOPHEN 325 MG PO TABS: 650.0000 mg | ORAL_TABLET | ORAL | Status: DC | PRN

## 2021-05-14 MED ORDER — CLOPIDOGREL BISULFATE 75 MG PO TABS: 75.0000 mg | ORAL_TABLET | Freq: Every day | ORAL | 11 refills | Status: DC

## 2021-05-14 MED ORDER — IODIXANOL 320 MG/ML IV SOLN
INTRAVENOUS | Status: DC | PRN
  Administered 2021-05-14: 75 mL

## 2021-05-14 MED ORDER — ATORVASTATIN CALCIUM 10 MG PO TABS: 10.0000 mg | ORAL_TABLET | Freq: Every day | ORAL | Status: DC

## 2021-05-14 MED ORDER — ASPIRIN EC 81 MG PO TBEC: 81.0000 mg | DELAYED_RELEASE_TABLET | Freq: Every day | ORAL | 2 refills | Status: DC

## 2021-05-14 SURGICAL SUPPLY — 26 items
BALLN DORADO7X100X80 (BALLOONS) ×3
BALLN LUTONIX 018 6X150X130 (BALLOONS) ×3
BALLN LUTONIX 7X220X130 (BALLOONS) ×3
BALLN LUTONIX DCB 6X40X130 (BALLOONS) ×3
BALLN ULTRVRSE 2.5X220X150 (BALLOONS) ×3
BALLOON DORADO7X100X80 (BALLOONS) IMPLANT
BALLOON LUTONIX 018 6X150X130 (BALLOONS) IMPLANT
BALLOON LUTONIX 7X220X130 (BALLOONS) IMPLANT
BALLOON LUTONIX DCB 6X40X130 (BALLOONS) IMPLANT
BALLOON ULTRVRSE 2.5X220X150 (BALLOONS) IMPLANT
CATH ANGIO 5F PIGTAIL 100CM (CATHETERS) ×2 IMPLANT
CATH ANGIO 5F PIGTAIL 65CM (CATHETERS) ×2 IMPLANT
CATH BEACON 5 .038 100 VERT TP (CATHETERS) ×2 IMPLANT
CATH NAVICROSS ANGLED 135CM (MICROCATHETER) ×2 IMPLANT
COVER PROBE U/S 5X48 (MISCELLANEOUS) ×2 IMPLANT
DEVICE STARCLOSE SE CLOSURE (Vascular Products) ×2 IMPLANT
GLIDEWIRE ADV .035X260CM (WIRE) ×2 IMPLANT
GUIDEWIRE PFTE-COATED .018X300 (WIRE) ×2 IMPLANT
KIT ENCORE 26 ADVANTAGE (KITS) ×2 IMPLANT
PACK ANGIOGRAPHY (CUSTOM PROCEDURE TRAY) ×3 IMPLANT
SHEATH ANL2 6FRX45 HC (SHEATH) ×2 IMPLANT
SHEATH BRITE TIP 5FRX11 (SHEATH) ×2 IMPLANT
STENT LIFESTENT 5F 7X120X135 (Permanent Stent) ×2 IMPLANT
SYR MEDRAD MARK 7 150ML (SYRINGE) ×2 IMPLANT
TUBING CONTRAST HIGH PRESS 72 (TUBING) ×2 IMPLANT
WIRE GUIDERIGHT .035X150 (WIRE) ×2 IMPLANT

## 2021-05-14 NOTE — Interval H&P Note (Signed)
History and Physical Interval Note:  05/14/2021 10:26 AM  Manuel [name]  has presented today for surgery, with the diagnosis of RLE Angio    BARD    PAD w ulceration.  The various methods of treatment have been discussed with the patient and family. After consideration of risks, benefits and other options for treatment, the patient has consented to  Procedure(s): LOWER EXTREMITY ANGIOGRAPHY (Right) as a surgical intervention.  The patient's history has been reviewed, patient examined, no change in status, stable for surgery.  I have reviewed the patient's chart and labs.  Questions were answered to the patient's satisfaction.     Festus Barren

## 2021-05-14 NOTE — Op Note (Signed)
[facility]  Percutaneous Study/Intervention Procedural Note   Date of Surgery: 05/14/2021  Surgeon(s):Brayden Betters    Assistants:none  Pre-operative Diagnosis: PAD with ulceration right lower extremity  Post-operative diagnosis:  Same  Procedure(s) Performed:             1.  Ultrasound guidance for vascular access left femoral artery             2.  Catheter placement into right common femoral artery from left femoral approach             3.  Aortogram and selective right lower extremity angiogram             4.  Percutaneous transluminal angioplasty of left common iliac artery with 6 mm diameter by 4 cm length Lutonix drug-coated angioplasty balloon             5.  Percutaneous transluminal angioplasty of the right peroneal artery and tibioperoneal trunk with 2.5 mm diameter by 22 cm length angioplasty balloon  6.  Percutaneous transluminal angioplasty of the right distal SFA and above-knee popliteal artery with 6 mm diameter Lutonix drug-coated angioplasty balloon  7.  Stent placement to the right distal SFA and proximal popliteal artery with a 7 mm diameter by 12 cm length life stent  8.  Percutaneous transluminal angioplasty of the right posterior tibial artery with 2.5 mm diameter by 22 cm length angioplasty balloon             9.  StarClose closure device left femoral artery  EBL: 5 cc  Contrast: 75 cc  Fluoro Time: 10.8 minutes  Moderate Conscious Sedation Time: approximately 64 minutes using 5 mg of Versed and 175 mcg of Fentanyl              Indications:  Patient is a 76 y.o.male with nonhealing ulcerations of the right foot. The patient has noninvasive study showing markedly reduced perfusion on the right. The patient is brought in for angiography for further evaluation and potential treatment.  Due to the limb threatening nature of the situation, angiogram was performed for attempted limb salvage. The patient is aware that if the procedure fails,  amputation would be expected.  The patient also understands that even with successful revascularization, amputation may still be required due to the severity of the situation.  Risks and benefits are discussed and informed consent is obtained.   Procedure:  The patient was identified and appropriate procedural time out was performed.  The patient was then placed supine on the table and prepped and draped in the usual sterile fashion. Moderate conscious sedation was administered during a face to face encounter with the patient throughout the procedure with my supervision of the RN administering medicines and monitoring the patient's vital signs, pulse oximetry, telemetry and mental status throughout from the start of the procedure until the patient was taken to the recovery room. Ultrasound was used to evaluate the left common femoral artery.  It was patent .  A digital ultrasound image was acquired.  A Seldinger needle was used to access the left common femoral artery under direct ultrasound guidance and a permanent image was performed.  A 0.035 J wire was advanced without resistance and a 5Fr sheath was placed.  Pigtail catheter was placed into the aorta and an AP aortogram was performed. This demonstrated that the renal arteries appeared to be patent.  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  The left common iliac  artery had a high-grade stenosis of greater than 80%.  Left external iliac artery had no significant stenosis.  The right common and external iliac arteries were highly calcific but not stenotic. I then crossed the aortic bifurcation and advanced to the right femoral head. Selective right lower extremity angiogram was then performed. This demonstrated fairly normal common femoral artery, profunda femoris artery, and proximal superficial femoral artery with occlusion of the mid to distal superficial femoral artery with reconstitution of the proximal popliteal artery.  The  above-knee popliteal artery after reconstitution had another stenosis in the 60% range.  There was then severe tibial disease.  The peroneal artery had high-grade stenosis of greater than 80% in the proximal to mid segment.  Posterior tibial artery had multiple areas of short segment occlusion and there was stenosis in the tibioperoneal trunk greater than 60%.  The anterior tibial artery was chronically occluded without distal reconstitution. It was felt that it was in the patient's best interest to proceed with intervention after these images to avoid a second procedure and a larger amount of contrast and fluoroscopy based off of the findings from the initial angiogram. The patient was systemically heparinized and a 6 [place] Ansell sheath was then placed over the [employer] wire. I then used a Kumpe catheter and the advantage wire to navigate through the SFA and popliteal occlusions but the Kumpe catheter would not track through the calcific lesions we exchanged for a Nava cross catheter.  Using this, I was able to navigate down into the tibioperoneal trunk and then exchanged for a 0.018 advantage wire to cross the disease in the proximal peroneal artery.  A 2.5 mm diameter by 22 cm length angioplasty balloon was then inflated in the tibioperoneal trunk, proximal to mid peroneal artery and taken up to 10 atm for 1 minute.  This was then used to predilate the SFA and popliteal lesion that was highly calcific.  The SFA and popliteal lesions were then definitively treated with a 6 mm diameter by 15 cm length Lutonix drug-coated angioplasty balloon inflated to 12 atm for 1 minute.  Completion imaging showed greater than 50% residual stenosis in the distal SFA and above-knee popliteal artery.  The peroneal artery and tibioperoneal trunk were significantly improved with less than 30% residual stenosis.  I then addressed the residual disease in the SFA and popliteal artery with a 7 mm diameter by 12 cm length life  stent postdilated with a 7 mm diameter high-pressure balloon with about a 10 to 15% residual stenosis after stent placement.  To give him a second runoff vessel distally, I turned my attention to the posterior tibial artery.  Using the [facility] cross catheter and the 0.018 advantage wire was able to track across the heavily diseased posterior tibial artery parking the wire in the foot.  This was then addressed with 2 inflations with 2.5 mm diameter by 22 cm length angioplasty balloon with each inflation being 8 to 10 atm for 1 minute.  Completion imaging was very limited due to the patient's continuous motion at this point the procedure but there was now inline flow through the posterior tibial artery although it was difficult to discern how much residual stenosis there was.  He now had two-vessel runoff distally. I elected to terminate the procedure. The sheath was removed and StarClose closure device was deployed in the left femoral artery with excellent hemostatic result. The patient was taken to the recovery room in stable condition having tolerated the procedure well.  Findings:               Aortogram:  This demonstrated that the renal arteries appeared to be patent.  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  The left common iliac artery had a high-grade stenosis of greater than 80%.  Left external iliac artery had no significant stenosis.  The right common and external iliac arteries were highly calcific but not stenotic             Right Lower Extremity:  This demonstrated fairly normal common femoral artery, profunda femoris artery, and proximal superficial femoral artery with occlusion of the mid to distal superficial femoral artery with reconstitution of the proximal popliteal artery.  The above-knee popliteal artery after reconstitution had another stenosis in the 60% range.  There was then severe tibial disease.  The peroneal artery had high-grade stenosis of greater than 80% in  the proximal to mid segment.  Posterior tibial artery had multiple areas of short segment occlusion and there was stenosis in the tibioperoneal trunk greater than 60%.  The anterior tibial artery was chronically occluded without distal reconstitution   Disposition: Patient was taken to the recovery room in stable condition having tolerated the procedure well.  Complications: None  Leotis Pain 05/14/2021 1:16 PM   This note was created with Dragon Medical transcription system. Any errors in dictation are purely unintentional.

## 2021-05-15 ENCOUNTER — Encounter: Payer: Self-pay | Admitting: Vascular Surgery

## 2021-05-22 ENCOUNTER — Telehealth (INDEPENDENT_AMBULATORY_CARE_PROVIDER_SITE_OTHER): Payer: Self-pay

## 2021-05-22 ENCOUNTER — Encounter (INDEPENDENT_AMBULATORY_CARE_PROVIDER_SITE_OTHER): Payer: Self-pay | Admitting: Vascular Surgery

## 2021-05-22 ENCOUNTER — Encounter (INDEPENDENT_AMBULATORY_CARE_PROVIDER_SITE_OTHER): Payer: Self-pay

## 2021-05-22 NOTE — Telephone Encounter (Signed)
Pt's wife called and wanted a return call when the call was returned the pt's wife expressed concerns about her husbands condition since his LE angio on the 05/14/21. The pt's wife said the pt's leg is swollen and red and he's in a lot of pain and has not slept, but that he would be upset if he found out she called our office for him . I made the pt's wife aware that I am un able to do anything with out her husband so she should go home and speak with her husband  and have him call us back .

## 2021-05-22 NOTE — Telephone Encounter (Signed)
While I understand her concern, he ultimately needs to be the one that contacts [place], unless she has his permission to do so.  Her husband had a lot of work done to his leg and redness and swelling are not uncommon because his leg is now getting blood flow that it wasn't getting before.  He should elevate it to help with swelling, we can also send in something for pain but he would need to ask [place] for that

## 2021-05-23 NOTE — Telephone Encounter (Signed)
I called and left a VM for the pt's wife making her aware of the NP's instructions.

## 2021-06-06 ENCOUNTER — Other Ambulatory Visit (INDEPENDENT_AMBULATORY_CARE_PROVIDER_SITE_OTHER): Payer: Self-pay | Admitting: Vascular Surgery

## 2021-06-06 ENCOUNTER — Ambulatory Visit (INDEPENDENT_AMBULATORY_CARE_PROVIDER_SITE_OTHER): Payer: Medicare Other | Admitting: Nurse Practitioner

## 2021-06-06 ENCOUNTER — Telehealth (INDEPENDENT_AMBULATORY_CARE_PROVIDER_SITE_OTHER): Payer: Self-pay | Admitting: Vascular Surgery

## 2021-06-06 ENCOUNTER — Encounter (INDEPENDENT_AMBULATORY_CARE_PROVIDER_SITE_OTHER): Payer: Medicare Other

## 2021-06-06 DIAGNOSIS — Z95820 Peripheral vascular angioplasty status with implants and grafts: Secondary | ICD-10-CM

## 2021-06-06 DIAGNOSIS — I70239 Atherosclerosis of native arteries of right leg with ulceration of unspecified site: Secondary | ICD-10-CM

## 2021-06-07 ENCOUNTER — Other Ambulatory Visit: Payer: Self-pay

## 2021-06-07 ENCOUNTER — Ambulatory Visit (INDEPENDENT_AMBULATORY_CARE_PROVIDER_SITE_OTHER): Payer: Medicare Other

## 2021-06-07 ENCOUNTER — Ambulatory Visit (INDEPENDENT_AMBULATORY_CARE_PROVIDER_SITE_OTHER): Payer: Medicare Other | Admitting: Nurse Practitioner

## 2021-06-07 ENCOUNTER — Encounter (INDEPENDENT_AMBULATORY_CARE_PROVIDER_SITE_OTHER): Payer: Self-pay | Admitting: Nurse Practitioner

## 2021-06-07 VITALS — BP 136/80 | HR 73 | Ht 68.0 in | Wt 186.0 lb

## 2021-06-07 DIAGNOSIS — I70239 Atherosclerosis of native arteries of right leg with ulceration of unspecified site: Secondary | ICD-10-CM

## 2021-06-07 DIAGNOSIS — E782 Mixed hyperlipidemia: Secondary | ICD-10-CM

## 2021-06-07 DIAGNOSIS — I10 Essential (primary) hypertension: Secondary | ICD-10-CM | POA: Diagnosis not present

## 2021-06-07 DIAGNOSIS — I7025 Atherosclerosis of native arteries of other extremities with ulceration: Secondary | ICD-10-CM

## 2021-06-07 DIAGNOSIS — E119 Type 2 diabetes mellitus without complications: Secondary | ICD-10-CM

## 2021-06-07 DIAGNOSIS — F1010 Alcohol abuse, uncomplicated: Secondary | ICD-10-CM | POA: Insufficient documentation

## 2021-06-07 DIAGNOSIS — Z95820 Peripheral vascular angioplasty status with implants and grafts: Secondary | ICD-10-CM | POA: Diagnosis not present

## 2021-06-07 DIAGNOSIS — Z794 Long term (current) use of insulin: Secondary | ICD-10-CM

## 2021-06-11 ENCOUNTER — Other Ambulatory Visit (INDEPENDENT_AMBULATORY_CARE_PROVIDER_SITE_OTHER): Payer: Self-pay | Admitting: Nurse Practitioner

## 2021-06-11 MED ORDER — HYDROCODONE-ACETAMINOPHEN 5-325 MG PO TABS: 1.0000 | ORAL_TABLET | ORAL | 0 refills | Status: AC | PRN

## 2021-06-12 ENCOUNTER — Emergency Department: Payer: Medicare Other

## 2021-06-12 ENCOUNTER — Other Ambulatory Visit: Payer: Self-pay

## 2021-06-12 ENCOUNTER — Inpatient Hospital Stay
Admission: EM | Admit: 2021-06-12 | Discharge: 2021-06-17 | DRG: 271 | Disposition: A | Discharge status: Home / Self Care | Payer: Medicare Other | Attending: Internal Medicine | Admitting: Internal Medicine

## 2021-06-12 DIAGNOSIS — Z79899 Other long term (current) drug therapy: Secondary | ICD-10-CM

## 2021-06-12 DIAGNOSIS — Z7982 Long term (current) use of aspirin: Secondary | ICD-10-CM

## 2021-06-12 DIAGNOSIS — I700 Atherosclerosis of aorta: Secondary | ICD-10-CM | POA: Diagnosis present

## 2021-06-12 DIAGNOSIS — T82518A Breakdown (mechanical) of other cardiac and vascular devices and implants, initial encounter: Principal | ICD-10-CM | POA: Diagnosis present

## 2021-06-12 DIAGNOSIS — Z8249 Family history of ischemic heart disease and other diseases of the circulatory system: Secondary | ICD-10-CM

## 2021-06-12 DIAGNOSIS — R109 Unspecified abdominal pain: Secondary | ICD-10-CM

## 2021-06-12 DIAGNOSIS — Z91199 Patient's noncompliance with other medical treatment and regimen due to unspecified reason: Secondary | ICD-10-CM

## 2021-06-12 DIAGNOSIS — Z20822 Contact with and (suspected) exposure to covid-19: Secondary | ICD-10-CM | POA: Diagnosis present

## 2021-06-12 DIAGNOSIS — M79604 Pain in right leg: Secondary | ICD-10-CM | POA: Diagnosis not present

## 2021-06-12 DIAGNOSIS — Z95820 Peripheral vascular angioplasty status with implants and grafts: Secondary | ICD-10-CM

## 2021-06-12 DIAGNOSIS — I739 Peripheral vascular disease, unspecified: Secondary | ICD-10-CM | POA: Diagnosis present

## 2021-06-12 DIAGNOSIS — I708 Atherosclerosis of other arteries: Secondary | ICD-10-CM | POA: Diagnosis present

## 2021-06-12 DIAGNOSIS — I10 Essential (primary) hypertension: Secondary | ICD-10-CM | POA: Diagnosis present

## 2021-06-12 DIAGNOSIS — E11621 Type 2 diabetes mellitus with foot ulcer: Secondary | ICD-10-CM | POA: Diagnosis present

## 2021-06-12 DIAGNOSIS — E119 Type 2 diabetes mellitus without complications: Secondary | ICD-10-CM

## 2021-06-12 DIAGNOSIS — I7025 Atherosclerosis of native arteries of other extremities with ulceration: Secondary | ICD-10-CM | POA: Diagnosis present

## 2021-06-12 DIAGNOSIS — I2510 Atherosclerotic heart disease of native coronary artery without angina pectoris: Secondary | ICD-10-CM

## 2021-06-12 DIAGNOSIS — I743 Embolism and thrombosis of arteries of the lower extremities: Secondary | ICD-10-CM | POA: Diagnosis present

## 2021-06-12 DIAGNOSIS — K5900 Constipation, unspecified: Secondary | ICD-10-CM

## 2021-06-12 DIAGNOSIS — T45526A Underdosing of antithrombotic drugs, initial encounter: Secondary | ICD-10-CM | POA: Diagnosis present

## 2021-06-12 DIAGNOSIS — Z91138 Patient's unintentional underdosing of medication regimen for other reason: Secondary | ICD-10-CM

## 2021-06-12 DIAGNOSIS — I998 Other disorder of circulatory system: Secondary | ICD-10-CM | POA: Diagnosis present

## 2021-06-12 DIAGNOSIS — Z7902 Long term (current) use of antithrombotics/antiplatelets: Secondary | ICD-10-CM

## 2021-06-12 DIAGNOSIS — L97519 Non-pressure chronic ulcer of other part of right foot with unspecified severity: Secondary | ICD-10-CM

## 2021-06-12 DIAGNOSIS — E1151 Type 2 diabetes mellitus with diabetic peripheral angiopathy without gangrene: Secondary | ICD-10-CM | POA: Diagnosis present

## 2021-06-12 DIAGNOSIS — Y712 Prosthetic and other implants, materials and accessory cardiovascular devices associated with adverse incidents: Secondary | ICD-10-CM | POA: Diagnosis present

## 2021-06-12 DIAGNOSIS — I70235 Atherosclerosis of native arteries of right leg with ulceration of other part of foot: Secondary | ICD-10-CM | POA: Diagnosis present

## 2021-06-12 DIAGNOSIS — I252 Old myocardial infarction: Secondary | ICD-10-CM

## 2021-06-12 DIAGNOSIS — Z7984 Long term (current) use of oral hypoglycemic drugs: Secondary | ICD-10-CM

## 2021-06-12 DIAGNOSIS — Z794 Long term (current) use of insulin: Secondary | ICD-10-CM

## 2021-06-12 LAB — COMPREHENSIVE METABOLIC PANEL
ALT: 15 U/L (ref 0–44)
AST: 15 U/L (ref 15–41)
Albumin: 3.5 g/dL (ref 3.5–5.0)
Alkaline Phosphatase: 77 U/L (ref 38–126)
Anion gap: 10 (ref 5–15)
BUN: 17 mg/dL (ref 8–23)
CO2: 27 mmol/L (ref 22–32)
Calcium: 9.3 mg/dL (ref 8.9–10.3)
Chloride: 94 mmol/L — ABNORMAL LOW (ref 98–111)
Creatinine, Ser: 0.87 mg/dL (ref 0.61–1.24)
GFR, Estimated: >60 mL/min (ref >60)
Glucose, Bld: 291 mg/dL — ABNORMAL HIGH (ref 70–99)
Potassium: 4 mmol/L (ref 3.5–5.1)
Sodium: 131 mmol/L — ABNORMAL LOW (ref 135–145)
Total Bilirubin: 1.1 mg/dL (ref 0.3–1.2)
Total Protein: 7.1 g/dL (ref 6.5–8.1)

## 2021-06-12 LAB — CBC WITH DIFFERENTIAL/PLATELET
Abs Immature Granulocytes: 0.04 10*3/uL (ref 0.00–0.07)
Basophils Absolute: 0.1 10*3/uL (ref 0.0–0.1)
Basophils Relative: 1 %
Eosinophils Absolute: 0.2 10*3/uL (ref 0.0–0.5)
Eosinophils Relative: 2 %
HCT: 41.4 % (ref 39.0–52.0)
Hemoglobin: 14.6 g/dL (ref 13.0–17.0)
Immature Granulocytes: 0 %
Lymphocytes Relative: 22 %
Lymphs Abs: 2.1 10*3/uL (ref 0.7–4.0)
MCH: 30 pg (ref 26.0–34.0)
MCHC: 35.3 g/dL (ref 30.0–36.0)
MCV: 85.2 fL (ref 80.0–100.0)
Monocytes Absolute: 1 10*3/uL (ref 0.1–1.0)
Monocytes Relative: 10 %
Neutro Abs: 6.5 10*3/uL (ref 1.7–7.7)
Neutrophils Relative %: 65 %
Platelets: 283 10*3/uL (ref 150–400)
RBC: 4.86 MIL/uL (ref 4.22–5.81)
RDW: 12.6 % (ref 11.5–15.5)
WBC: 9.9 10*3/uL (ref 4.0–10.5)
nRBC: 0 % (ref 0.0–0.2)

## 2021-06-12 MED ORDER — HEPARIN BOLUS VIA INFUSION
5100.0000 [IU] | Freq: Once | INTRAVENOUS | Status: AC
  Administered 2021-06-13: 5100 [IU] via INTRAVENOUS
  Filled 2021-06-12: qty 5100

## 2021-06-12 MED ORDER — FENTANYL CITRATE PF 50 MCG/ML IJ SOSY
50.0000 ug | PREFILLED_SYRINGE | Freq: Once | INTRAMUSCULAR | Status: AC
  Administered 2021-06-12: 50 ug via INTRAVENOUS
  Filled 2021-06-12: qty 1

## 2021-06-12 MED ORDER — HEPARIN (PORCINE) 25000 UT/250ML-% IV SOLN
1650.0000 [IU]/h | INTRAVENOUS | Status: DC
  Administered 2021-06-13: 1350 [IU]/h via INTRAVENOUS
  Administered 2021-06-13 – 2021-06-14 (×2): 1650 [IU]/h via INTRAVENOUS
  Filled 2021-06-12 (×3): qty 250

## 2021-06-12 MED ORDER — ONDANSETRON HCL 4 MG/2ML IJ SOLN
4.0000 mg | Freq: Once | INTRAMUSCULAR | Status: AC
  Administered 2021-06-12: 4 mg via INTRAVENOUS
  Filled 2021-06-12: qty 2

## 2021-06-12 NOTE — ED Triage Notes (Signed)
Pt presents to ER c/o RLE pain that he states started around 3 weeks ago but has become worse since Friday last week.  Pt states he had RLE angiography on 1/23 and since then has not been able to keep his pain under control.  Pt states pain is primarily in one spot in his inner thigh.  Pt has some wounds to his right foot but is not c/o pain there at this time.  Pt states he also has not had BM in around a week and wants to get that checked out.  Pt denies SOB, or chest pain at this time.  Pt A&O x4 at this time in NAD.

## 2021-06-12 NOTE — Progress Notes (Signed)
ANTICOAGULATION CONSULT NOTE  Pharmacy Consult for heparin infusion Indication: PAD  No Known Allergies  Patient Measurements: Height: 5\' 8"  (172.7 cm) Weight: 84.8 kg (187 lb) IBW/kg (Calculated) : 68.4 Heparin Dosing Weight: 84.8 kg  Vital Signs: Temp: 99 F (37.2 C) (02/21 1946) Temp Source: Oral (02/21 1946) BP: 163/93 (02/21 1946) Pulse Rate: 83 (02/21 1946)  Labs: Recent Labs    06/12/21 1958  HGB 14.6  HCT 41.4  PLT 283  CREATININE 0.87    Estimated Creatinine Clearance: 75.4 mL/min (by C-G formula based on SCr of 0.87 mg/dL).   Medical History: Past Medical History:  Diagnosis Date   Allergies    Arthritis    Diabetes mellitus without complication (HCC)    Hard of hearing    Hyperlipidemia    Hypertension    Myocardial infarction [facility]) 2008   treated here at [facility] and transferred to [place] for stent placement   Neuropathy    knees down   Peripheral vascular disease (HCC)    Sleep apnea    "in the past"   Status post primary angioplasty with coronary stent    Wears dentures    full upper and lower    Assessment: Pt is 76 yo male presenting to ED c/o worsening R leg pain s/p angioplasty w/ stent 05/14/21.  Goal of Therapy:  Heparin level 0.3-0.7 units/ml Monitor platelets by anticoagulation protocol: Yes   Plan:  Bolus 5100 units x 1 Start heparin infusion at 1350 units/hr Check HL in 8 hr after start of infusion CBC daily while on heparin  05/16/21, PharmD, [facility] 06/12/2021 11:53 PM

## 2021-06-13 DIAGNOSIS — Z8249 Family history of ischemic heart disease and other diseases of the circulatory system: Secondary | ICD-10-CM | POA: Diagnosis not present

## 2021-06-13 DIAGNOSIS — E1151 Type 2 diabetes mellitus with diabetic peripheral angiopathy without gangrene: Secondary | ICD-10-CM | POA: Diagnosis present

## 2021-06-13 DIAGNOSIS — I998 Other disorder of circulatory system: Secondary | ICD-10-CM | POA: Diagnosis not present

## 2021-06-13 DIAGNOSIS — T45526A Underdosing of antithrombotic drugs, initial encounter: Secondary | ICD-10-CM | POA: Diagnosis present

## 2021-06-13 DIAGNOSIS — Z794 Long term (current) use of insulin: Secondary | ICD-10-CM | POA: Diagnosis not present

## 2021-06-13 DIAGNOSIS — Y712 Prosthetic and other implants, materials and accessory cardiovascular devices associated with adverse incidents: Secondary | ICD-10-CM | POA: Diagnosis present

## 2021-06-13 DIAGNOSIS — Z7982 Long term (current) use of aspirin: Secondary | ICD-10-CM | POA: Diagnosis not present

## 2021-06-13 DIAGNOSIS — E11621 Type 2 diabetes mellitus with foot ulcer: Secondary | ICD-10-CM | POA: Diagnosis present

## 2021-06-13 DIAGNOSIS — I700 Atherosclerosis of aorta: Secondary | ICD-10-CM | POA: Diagnosis present

## 2021-06-13 DIAGNOSIS — Z91138 Patient's unintentional underdosing of medication regimen for other reason: Secondary | ICD-10-CM | POA: Diagnosis not present

## 2021-06-13 DIAGNOSIS — I70221 Atherosclerosis of native arteries of extremities with rest pain, right leg: Secondary | ICD-10-CM | POA: Diagnosis not present

## 2021-06-13 DIAGNOSIS — I10 Essential (primary) hypertension: Secondary | ICD-10-CM | POA: Diagnosis present

## 2021-06-13 DIAGNOSIS — M25552 Pain in left hip: Secondary | ICD-10-CM | POA: Diagnosis not present

## 2021-06-13 DIAGNOSIS — Z9114 Patient's other noncompliance with medication regimen: Secondary | ICD-10-CM | POA: Diagnosis not present

## 2021-06-13 DIAGNOSIS — I714 Abdominal aortic aneurysm, without rupture, unspecified: Secondary | ICD-10-CM | POA: Diagnosis not present

## 2021-06-13 DIAGNOSIS — L97519 Non-pressure chronic ulcer of other part of right foot with unspecified severity: Secondary | ICD-10-CM | POA: Diagnosis present

## 2021-06-13 DIAGNOSIS — R109 Unspecified abdominal pain: Secondary | ICD-10-CM | POA: Diagnosis not present

## 2021-06-13 DIAGNOSIS — Z79899 Other long term (current) drug therapy: Secondary | ICD-10-CM | POA: Diagnosis not present

## 2021-06-13 DIAGNOSIS — I70235 Atherosclerosis of native arteries of right leg with ulceration of other part of foot: Secondary | ICD-10-CM | POA: Diagnosis present

## 2021-06-13 DIAGNOSIS — Z91199 Patient's noncompliance with other medical treatment and regimen due to unspecified reason: Secondary | ICD-10-CM | POA: Diagnosis not present

## 2021-06-13 DIAGNOSIS — T82518A Breakdown (mechanical) of other cardiac and vascular devices and implants, initial encounter: Secondary | ICD-10-CM | POA: Diagnosis present

## 2021-06-13 DIAGNOSIS — Z7984 Long term (current) use of oral hypoglycemic drugs: Secondary | ICD-10-CM | POA: Diagnosis not present

## 2021-06-13 DIAGNOSIS — I2510 Atherosclerotic heart disease of native coronary artery without angina pectoris: Secondary | ICD-10-CM | POA: Diagnosis present

## 2021-06-13 DIAGNOSIS — I708 Atherosclerosis of other arteries: Secondary | ICD-10-CM | POA: Diagnosis present

## 2021-06-13 DIAGNOSIS — Z20822 Contact with and (suspected) exposure to covid-19: Secondary | ICD-10-CM | POA: Diagnosis present

## 2021-06-13 DIAGNOSIS — Z7902 Long term (current) use of antithrombotics/antiplatelets: Secondary | ICD-10-CM | POA: Diagnosis not present

## 2021-06-13 DIAGNOSIS — M79604 Pain in right leg: Secondary | ICD-10-CM | POA: Diagnosis present

## 2021-06-13 DIAGNOSIS — I252 Old myocardial infarction: Secondary | ICD-10-CM | POA: Diagnosis not present

## 2021-06-13 DIAGNOSIS — T82858A Stenosis of vascular prosthetic devices, implants and grafts, initial encounter: Secondary | ICD-10-CM | POA: Diagnosis not present

## 2021-06-13 DIAGNOSIS — I743 Embolism and thrombosis of arteries of the lower extremities: Secondary | ICD-10-CM | POA: Diagnosis present

## 2021-06-13 DIAGNOSIS — Z95820 Peripheral vascular angioplasty status with implants and grafts: Secondary | ICD-10-CM | POA: Diagnosis not present

## 2021-06-13 DIAGNOSIS — T82868A Thrombosis of vascular prosthetic devices, implants and grafts, initial encounter: Secondary | ICD-10-CM | POA: Diagnosis not present

## 2021-06-13 DIAGNOSIS — K5900 Constipation, unspecified: Secondary | ICD-10-CM | POA: Diagnosis not present

## 2021-06-13 LAB — PROTIME-INR
INR: 1 (ref 0.8–1.2)
Prothrombin Time: 13.5 seconds (ref 11.4–15.2)

## 2021-06-13 LAB — CBG MONITORING, ED
Glucose-Capillary: 293 mg/dL — ABNORMAL HIGH (ref 70–99)
Glucose-Capillary: 214 mg/dL — ABNORMAL HIGH (ref 70–99)

## 2021-06-13 LAB — RESP PANEL BY RT-PCR (FLU A&B, COVID) ARPGX2
Influenza A by PCR: NEGATIVE
Influenza B by PCR: NEGATIVE
SARS Coronavirus 2 by RT PCR: NEGATIVE

## 2021-06-13 LAB — HEPARIN LEVEL (UNFRACTIONATED)
Heparin Unfractionated: <0.1 IU/mL — ABNORMAL LOW (ref 0.30–0.70)
Heparin Unfractionated: 0.34 IU/mL (ref 0.30–0.70)

## 2021-06-13 LAB — GLUCOSE, CAPILLARY: Glucose-Capillary: 273 mg/dL — ABNORMAL HIGH (ref 70–99)

## 2021-06-13 LAB — APTT: aPTT: 33 seconds (ref 24–36)

## 2021-06-13 MED ORDER — PRAVASTATIN SODIUM 20 MG PO TABS
40.0000 mg | ORAL_TABLET | Freq: Every day | ORAL | Status: DC
  Administered 2021-06-13 – 2021-06-14 (×3): 40 mg via ORAL
  Filled 2021-06-13: qty 1
  Filled 2021-06-13 (×2): qty 2

## 2021-06-13 MED ORDER — LISINOPRIL 20 MG PO TABS
20.0000 mg | ORAL_TABLET | Freq: Every day | ORAL | Status: DC
  Administered 2021-06-13 – 2021-06-17 (×5): 20 mg via ORAL
  Filled 2021-06-13 (×5): qty 1

## 2021-06-13 MED ORDER — CARVEDILOL 12.5 MG PO TABS
12.5000 mg | ORAL_TABLET | Freq: Two times a day (BID) | ORAL | Status: DC
  Administered 2021-06-14 – 2021-06-17 (×6): 12.5 mg via ORAL
  Filled 2021-06-13 (×6): qty 1

## 2021-06-13 MED ORDER — INSULIN ASPART 100 UNIT/ML IJ SOLN
0.0000 [IU] | Freq: Three times a day (TID) | INTRAMUSCULAR | Status: DC
  Administered 2021-06-13: 5 [IU] via SUBCUTANEOUS
  Administered 2021-06-13 – 2021-06-14 (×2): 3 [IU] via SUBCUTANEOUS
  Administered 2021-06-14 (×2): 2 [IU] via SUBCUTANEOUS
  Administered 2021-06-15: 1 [IU] via SUBCUTANEOUS
  Administered 2021-06-15: 3 [IU] via SUBCUTANEOUS
  Administered 2021-06-16 (×2): 2 [IU] via SUBCUTANEOUS
  Administered 2021-06-16: 3 [IU] via SUBCUTANEOUS
  Administered 2021-06-17: 2 [IU] via SUBCUTANEOUS
  Filled 2021-06-13 (×11): qty 1

## 2021-06-13 MED ORDER — HYDROCODONE-ACETAMINOPHEN 5-325 MG PO TABS
1.0000 | ORAL_TABLET | ORAL | Status: DC | PRN
  Administered 2021-06-14 – 2021-06-16 (×5): 1 via ORAL
  Filled 2021-06-13 (×6): qty 1

## 2021-06-13 MED ORDER — HYDROMORPHONE HCL 1 MG/ML IJ SOLN
1.0000 mg | INTRAMUSCULAR | Status: DC | PRN
  Administered 2021-06-13 – 2021-06-16 (×9): 1 mg via INTRAVENOUS
  Filled 2021-06-13 (×9): qty 1

## 2021-06-13 MED ORDER — HEPARIN BOLUS VIA INFUSION
2500.0000 [IU] | Freq: Once | INTRAVENOUS | Status: AC
  Administered 2021-06-13: 2500 [IU] via INTRAVENOUS
  Filled 2021-06-13: qty 2500

## 2021-06-13 MED ORDER — ONDANSETRON HCL 4 MG/2ML IJ SOLN
4.0000 mg | Freq: Four times a day (QID) | INTRAMUSCULAR | Status: DC | PRN
  Administered 2021-06-16: 4 mg via INTRAVENOUS
  Filled 2021-06-13: qty 2

## 2021-06-13 MED ORDER — AMLODIPINE BESYLATE 10 MG PO TABS
10.0000 mg | ORAL_TABLET | Freq: Every day | ORAL | Status: DC
  Administered 2021-06-13 – 2021-06-17 (×5): 10 mg via ORAL
  Filled 2021-06-13 (×5): qty 1

## 2021-06-13 MED ORDER — CLOPIDOGREL BISULFATE 75 MG PO TABS
75.0000 mg | ORAL_TABLET | Freq: Every day | ORAL | Status: DC
  Administered 2021-06-14 – 2021-06-15 (×2): 75 mg via ORAL
  Filled 2021-06-13 (×3): qty 1

## 2021-06-13 MED ORDER — INSULIN ASPART 100 UNIT/ML IJ SOLN
0.0000 [IU] | Freq: Every day | INTRAMUSCULAR | Status: DC
  Administered 2021-06-13: 21:00:00 3 [IU] via SUBCUTANEOUS
  Administered 2021-06-15: 2 [IU] via SUBCUTANEOUS
  Filled 2021-06-13 (×2): qty 1

## 2021-06-13 MED ORDER — ACETAMINOPHEN 325 MG PO TABS: 650.0000 mg | ORAL_TABLET | Freq: Four times a day (QID) | ORAL | Status: DC | PRN

## 2021-06-13 MED ORDER — ACETAMINOPHEN 650 MG RE SUPP
650.0000 mg | Freq: Four times a day (QID) | RECTAL | Status: DC | PRN
Start: 2021-06-13 — End: 2021-06-17

## 2021-06-13 MED ORDER — SODIUM CHLORIDE 0.9 % IV SOLN: INTRAVENOUS | Status: DC

## 2021-06-13 MED ORDER — ONDANSETRON HCL 4 MG PO TABS: 4.0000 mg | ORAL_TABLET | Freq: Four times a day (QID) | ORAL | Status: DC | PRN

## 2021-06-13 NOTE — Progress Notes (Signed)
Inpatient Diabetes Program Recommendations  AACE/ADA: New Consensus Statement on Inpatient Glycemic Control (2015)  Target Ranges:  Prepandial:   less than 140 mg/dL      Peak postprandial:   less than 180 mg/dL (1-2 hours)      Critically ill patients:  140 - 180 mg/dL   Lab Results  Component Value Date   GLUCAP 293 (H) 06/13/2021    Review of Glycemic Control  Latest Reference Range & Units 06/13/21 08:57  Glucose-Capillary 70 - 99 mg/dL 539 (H)  (H): Data is abnormally high Diabetes history: Type 2 DM Outpatient Diabetes medications: Novolog 70/30 32 units BID, Metformin 500 mg BID Current orders for Inpatient glycemic control: Novolog 0-9 units TID & HS  Inpatient Diabetes Program Recommendations:    Consider also adding Semglee 14 units QD.   Thanks, Lujean Rave, MSN, RNC-OB Diabetes Coordinator [phone] (8a-5p)

## 2021-06-13 NOTE — Assessment & Plan Note (Addendum)
Continue amlodipine, lisinopril and carvedilol.  Still running high.  Most likely contributed due to pain.  Continue pain medications for severe hypertension

## 2021-06-13 NOTE — Progress Notes (Signed)
ANTICOAGULATION CONSULT NOTE  Pharmacy Consult for heparin infusion Indication: PAD  No Known Allergies  Patient Measurements: Height: 5\' 8"  (172.7 cm) Weight: 84.8 kg (187 lb) IBW/kg (Calculated) : 68.4 Heparin Dosing Weight: 84.8 kg  Vital Signs: Temp: 98.6 F (37 C) (02/22 0527) Temp Source: Oral (02/22 0527) BP: 151/75 (02/22 1000) Pulse Rate: 72 (02/22 1000)  Labs: Recent Labs    06/12/21 1958  HGB 14.6  HCT 41.4  PLT 283  APTT 33  LABPROT 13.5  INR 1.0  CREATININE 0.87     Estimated Creatinine Clearance: 75.4 mL/min (by C-G formula based on SCr of 0.87 mg/dL).   Medical History: Past Medical History:  Diagnosis Date   Allergies    Arthritis    Diabetes mellitus without complication ([place])    Hard of hearing    Hyperlipidemia    Hypertension    Myocardial infarction [facility]) 2008   treated here at [facility] and transferred to [place] for stent placement   Neuropathy    knees down   Peripheral vascular disease ([place])    Sleep apnea    "in the past"   Status post primary angioplasty with coronary stent    Wears dentures    full upper and lower  Meds: Heparin Dosing Weight: 84.8 kg PTA: Plavix & ASA Inpatient: Heparin gtt  Assessment: Pt is 76 yo male w/ h/o DM, HTN, CAD, PAD presenting to ED c/o worsening R leg pain s/p angioplasty w/ stent 05/14/21. Pt reports non-compliance with plavix since procedure (5 doses per month).  Date Time   HL Rate/Comment 2/22 0927 <0.1 Subthera; 1350 > 1650 un/hr     Baseline Labs: aPTT - 33s INR - 1 Hgb - 14.6 Plts - 283  Goal of Therapy:  Heparin level 0.3-0.7 units/ml Monitor platelets by anticoagulation protocol: Yes   Plan:  Subtherapeutic, less than assay on heparin level.  Bolus 2500 units x1; then increase heparin infusion to 1650 units/hr Check HL in 8 hr after start of infusion CTM CBC daily while on heparin  Lorna Dibble, PharmD, [facility] Clinical Pharmacist 06/13/2021 11:51 AM

## 2021-06-13 NOTE — Assessment & Plan Note (Addendum)
On admission, patient noted to have prolonged capillary refill, decreased pulses lower extremity lower extremity still warm and without cyanosis.  Started on heparin drip  Patient has been taking Plavix inconsistently since his procedure on 1/23.  Vascular surgery did angioplasty with thrombectomy, stent placement.  Started on Eliquis, aspirin will be continued on discharge

## 2021-06-13 NOTE — Progress Notes (Signed)
ANTICOAGULATION CONSULT NOTE  Pharmacy Consult for heparin infusion Indication: PAD  No Known Allergies  Patient Measurements: Height: 5\' 8"  (172.7 cm) Weight: 84.8 kg (187 lb) IBW/kg (Calculated) : 68.4 Heparin Dosing Weight: 84.8 kg  Vital Signs: Temp: 98.6 F (37 C) (02/22 0527) Temp Source: Oral (02/22 0527) BP: 159/85 (02/22 1300) Pulse Rate: 71 (02/22 1300)  Labs: Recent Labs    06/12/21 1958 06/13/21 0927  HGB 14.6  --   HCT 41.4  --   PLT 283  --   APTT 33  --   LABPROT 13.5  --   INR 1.0  --   HEPARINUNFRC  --  <0.10*  CREATININE 0.87  --      Estimated Creatinine Clearance: 75.4 mL/min (by C-G formula based on SCr of 0.87 mg/dL).   Medical History: Past Medical History:  Diagnosis Date   Allergies    Arthritis    Diabetes mellitus without complication ([place])    Hard of hearing    Hyperlipidemia    Hypertension    Myocardial infarction [facility]) 2008   treated here at [facility] and transferred to [place] for stent placement   Neuropathy    knees down   Peripheral vascular disease ([place])    Sleep apnea    "in the past"   Status post primary angioplasty with coronary stent    Wears dentures    full upper and lower  Meds: Heparin Dosing Weight: 84.8 kg PTA: Plavix & ASA Inpatient: Heparin gtt  Assessment: Pt is 76 yo male w/ h/o DM, HTN, CAD, PAD presenting to ED c/o worsening R leg pain s/p angioplasty w/ stent 05/14/21. Pt reports non-compliance with plavix since procedure (5 doses per month).  right lower extremity angiogram pending      Goal of Therapy:  Heparin level 0.3-0.7 units/ml Monitor platelets by anticoagulation protocol: Yes   Plan:  Heparin level is therapeutic as a result of the previous rate change: continue heparin infusion at 1650 units/hr recheck heparin level in 8 hours CTM CBC daily while on heparin  Vallery Sa, PharmD, BCPS Clinical Pharmacist 06/13/2021 3:38 PM

## 2021-06-13 NOTE — ED Provider Notes (Signed)
[facility] Provider Note    Event Date/Time   First MD Initiated Contact with Patient 06/12/21 2317     (approximate)   History   Leg Pain   HPI  Manuel [name] is a 76 y.o. male with a history of PAD status post angiography with angioplasty of the right peroneal artery and tibioperoneal trunk, right distal SFA and above-knee popliteal artery, and right distal SFA and proximal popliteal artery on May 14, 2021 who presents for evaluation of right leg pain.  Patient reports that he has suffered from pain on that leg since the surgery but the pain has been getting progressively worse.  Patient tells me that he was unaware that he was supposed to be taking Plavix every day.  He reports that since the procedure a month ago he probably took Plavix only 5 times.  He reports that over the last 3 days he has been unable to sleep due to the severity of the pain.  The pain is a constant dull pain worse on his thigh that is worse at nighttime and better when his leg is laying down.  He reports that he tried a Norco last night but felt sick to his stomach and vomited after taking that.  He reports that he was taking aspirin and ibuprofen and that was helping but that stopped helping several days ago.     Past Medical History:  Diagnosis Date   Allergies    Arthritis    Diabetes mellitus without complication (HCC)    Hard of hearing    Hyperlipidemia    Hypertension    Myocardial infarction [facility]) 2008   treated here at [facility] and transferred to [place] for stent placement   Neuropathy    knees down   Peripheral vascular disease (HCC)    Sleep apnea    "in the past"   Status post primary angioplasty with coronary stent    Wears dentures    full upper and lower    Past Surgical History:  Procedure Laterality Date   CATARACT EXTRACTION W/PHACO Right 03/14/2021   Procedure: CATARACT EXTRACTION PHACO AND INTRAOCULAR LENS PLACEMENT (IOC) RIGHT DIABETIC;  Surgeon:  Lockie Mola, MD;  Location: [facility];  Service: Ophthalmology;  Laterality: Right;  Diabetic 16.78 01:39.9   CATARACT EXTRACTION W/PHACO Left 03/28/2021   Procedure: CATARACT EXTRACTION PHACO AND INTRAOCULAR LENS PLACEMENT (IOC) LEFT DIABETIC 6.93 01:22.0;  Surgeon: Lockie Mola, MD;  Location: [facility];  Service: Ophthalmology;  Laterality: Left;  Diabetic   LOWER EXTREMITY ANGIOGRAPHY Right 05/14/2021   Procedure: LOWER EXTREMITY ANGIOGRAPHY;  Surgeon: Annice Needy, MD;  Location: [facility] INVASIVE CV LAB;  Service: Cardiovascular;  Laterality: Right;   TONSILLECTOMY     had removed as a child     Physical Exam   Triage Vital Signs: ED Triage Vitals  Enc Vitals Group     BP 06/12/21 1946 (!) 163/93     Pulse Rate 06/12/21 1946 83     Resp 06/12/21 1946 17     Temp 06/12/21 1946 99 F (37.2 C)     Temp Source 06/12/21 1946 Oral     SpO2 06/12/21 1946 97 %     Weight 06/12/21 1954 187 lb (84.8 kg)     Height 06/12/21 1954 5\' 8"  (1.727 m)     Head Circumference --      Peak Flow --      Pain Score 06/12/21 1951 10  Pain Loc --      Pain Edu? --      Excl. in GC? --     Most recent vital signs: Vitals:   06/13/21 0003 06/13/21 0005  BP:    Pulse: 75   Resp:  20  Temp:  99 F (37.2 C)  SpO2: 93%      Constitutional: Alert and oriented. Well appearing and in no apparent distress. HEENT:      Head: Normocephalic and atraumatic.         Eyes: Conjunctivae are normal. Sclera is non-icteric.       Mouth/Throat: Mucous membranes are moist.       Neck: Supple with no signs of meningismus. Cardiovascular: Regular rate and rhythm. No murmurs, gallops, or rubs. 2+ symmetrical distal pulses are present in all extremities.  Respiratory: Normal respiratory effort. Lungs are clear to auscultation bilaterally.  Gastrointestinal: Soft, non tender Musculoskeletal: Right lower extremity is warm, there is a ulceration to the bottom of the right  foot with no signs of surrounding cellulitis, patient has palpable right femoral pulse, no palpable or dopplerable pulses on the popliteal, PT and DP arteries on the right.  Cap refill was 5 seconds.  Neurologic: Normal speech and language. Face is symmetric. Moving all extremities. No gross focal neurologic deficits are appreciated. Skin: Skin is warm, dry and intact. No rash noted. Psychiatric: Mood and affect are normal. Speech and behavior are normal.  ED Results / Procedures / Treatments   Labs (all labs ordered are listed, but only abnormal results are displayed) Labs Reviewed  COMPREHENSIVE METABOLIC PANEL - Abnormal; Notable for the following components:      Result Value   Sodium 131 (*)    Chloride 94 (*)    Glucose, Bld 291 (*)    All other components within normal limits  RESP PANEL BY RT-PCR (FLU A&B, COVID) ARPGX2  CBC WITH DIFFERENTIAL/PLATELET  APTT  PROTIME-INR     EKG  none   RADIOLOGY I, Nita Sicklearolina Nalee Lightle, attending MD, have personally viewed and interpreted the images obtained during this visit as below:  US neg for DVT   ___________________________________________________ Interpretation by Radiologist:  US Venous Img Lower Unilateral Right  Result Date: 06/12/2021 CLINICAL DATA:  Recent right lower extremity angiography with pain, initial encounter EXAM: RIGHT LOWER EXTREMITY VENOUS DOPPLER ULTRASOUND TECHNIQUE: Gray-scale sonography with graded compression, as well as color Doppler and duplex ultrasound were performed to evaluate the lower extremity deep venous systems from the level of the common femoral vein and including the common femoral, femoral, profunda femoral, popliteal and calf veins including the posterior tibial, peroneal and gastrocnemius veins when visible. The superficial great saphenous vein was also interrogated. Spectral Doppler was utilized to evaluate flow at rest and with distal augmentation maneuvers in the common femoral, femoral and  popliteal veins. COMPARISON:  None. FINDINGS: Contralateral Common Femoral Vein: Respiratory phasicity is normal and symmetric with the symptomatic side. No evidence of thrombus. Normal compressibility. Common Femoral Vein: No evidence of thrombus. Normal compressibility, respiratory phasicity and response to augmentation. Saphenofemoral Junction: No evidence of thrombus. Normal compressibility and flow on color Doppler imaging. Profunda Femoral Vein: No evidence of thrombus. Normal compressibility and flow on color Doppler imaging. Femoral Vein: No evidence of thrombus. Normal compressibility, respiratory phasicity and response to augmentation. Popliteal Vein: No evidence of thrombus. Normal compressibility, respiratory phasicity and response to augmentation. Calf Veins: No evidence of thrombus. Normal compressibility and flow on color Doppler imaging. Superficial Great Saphenous  Vein: No evidence of thrombus. Normal compressibility. Venous Reflux:  None. Other Findings:  None. IMPRESSION: No evidence of deep venous thrombosis. Electronically Signed   By: Alcide Clever M.D.   On: 06/12/2021 21:02      PROCEDURES:  Critical Care performed: Yes, see critical care procedure note(s)  .Critical Care Performed by: Nita Sickle, MD Authorized by: Nita Sickle, MD   Critical care provider statement:    Critical care time (minutes):  30   Critical care time was exclusive of:  Separately billable procedures and treating other patients   Critical care was necessary to treat or prevent imminent or life-threatening deterioration of the following conditions:  Circulatory failure (limb ischemia)   Critical care was time spent personally by me on the following activities:  Development of treatment plan with patient or surrogate, discussions with consultants, evaluation of patient's response to treatment, examination of patient, ordering and review of laboratory studies, ordering and review of  radiographic studies, ordering and performing treatments and interventions, pulse oximetry, re-evaluation of patient's condition and review of old charts   I assumed direction of critical care for this patient from another provider in my specialty: no     Care discussed with: admitting provider      IMPRESSION / MDM / ASSESSMENT AND PLAN / ED COURSE  I reviewed the triage vital signs and the nursing notes.  76 y.o. male with a history of PAD status post angiography with angioplasty of the right peroneal artery and tibioperoneal trunk, right distal SFA and above-knee popliteal artery, and right distal SFA and proximal popliteal artery on May 14, 2021 who presents for evaluation of right leg pain.  Patient with progressively worsening pain since his angioplasty now unable to sleep for the last 3 days.  I reviewed the note from his angioplasty with Dr. Wyn Quaker and also his last visit with his podiatrist which was 2 weeks ago (after the angioplasty.  According to that note patient had a 1+ palpable DP pulse on the right.  Today on my examination patient has strong palpable femoral pulse on the right but no dopplerable pulses on popliteal, DP, and PT arteries.  His leg is still warm and a cap refill was 5 seconds.  I am concerned to have worsening arterial disease or possible stenosis of the stents placed since patient has not been taking his Plavix as prescribed.  Therefore to prevent worsening pain and to prevent an acute arterial occlusion I will start patient on IV heparin and admit him to the hospitalist service for evaluation by vascular surgery in the morning.  I will give him a dose of IV fentanyl for pain.  Ultrasound was done to rule out DVT and that is negative.  Patient's labs show no leukocytosis, no AKI, no significant electrolyte derangements, normal coags.  Since there is no acute arterial occlusion I do not think vascular needs to be consulted emergently overnight.  I did consult the hospitalist  for admission and after discussing the case patient was accepted to their service with plan to consult vascular surgery in the morning.  MEDICATIONS GIVEN IN ED: Medications  heparin ADULT infusion 100 units/mL (25000 units/211mL) (1,350 Units/hr Intravenous New Bag/Given 06/13/21 0012)  fentaNYL (SUBLIMAZE) injection 50 mcg (50 mcg Intravenous Given 06/12/21 2356)  ondansetron (ZOFRAN) injection 4 mg (4 mg Intravenous Given 06/12/21 2356)  heparin bolus via infusion 5,100 Units (5,100 Units Intravenous Bolus from Bag 06/13/21 0011)     Consults: Hospitalist  FINAL CLINICAL IMPRESSION(S) / ED DIAGNOSES   Final diagnoses:  Peripheral artery disease (HCC)  Right leg claudication (HCC)     Rx / DC Orders   ED Discharge Orders     None        Note:  This document was prepared using Dragon voice recognition software and may include unintentional dictation errors.   Please note:  Patient was evaluated in Emergency Department today for the symptoms described in the history of present illness. Patient was evaluated in the context of the global COVID-19 pandemic, which necessitated consideration that the patient might be at risk for infection with the SARS-CoV-2 virus that causes COVID-19. Institutional protocols and algorithms that pertain to the evaluation of patients at risk for COVID-19 are in a state of rapid change based on information released by regulatory bodies including the CDC and federal and state organizations. These policies and algorithms were followed during the patient's care in the ED.  Some ED evaluations and interventions may be delayed as a result of limited staffing during the pandemic.       Don Perking, [place], MD 06/13/21 [phone]

## 2021-06-13 NOTE — ED Notes (Signed)
Pt assisted using urinal for urination. Pain 9/10 reported at time.

## 2021-06-13 NOTE — ED Notes (Signed)
Pt assisted to bedside toilet for urination. Pt back to bed with bed adjusted and warm blanket in place as well as [place] comfort pack to help with rest. Pt reminded of NPO status due to water bottle at bedside.

## 2021-06-13 NOTE — Assessment & Plan Note (Addendum)
Sliding scale insulin coverage.Added long acting insulin

## 2021-06-13 NOTE — H&P (Signed)
History and Physical    Patient: Manuel [name] [id number] DOB: [birth year] DOA: 06/12/2021 DOS: the patient was seen and examined on 06/13/2021 PCP: Baxter Hire, MD  Patient coming from: Home  Chief Complaint:  Chief Complaint  Patient presents with   Leg Pain    HPI: Manuel [name] is a 76 y.o. male with medical history significant of DM, HTN, CAD, PAD s/p angioplasty with stent 05/14/2021 who presents to the ED with right leg pain with ambulation and at rest that has been ongoing since his surgery and getting progressively worse.  He has been using pain medication without significant relief and now he has a pain throughout the night.  He admits to not using his Plavix regularly since the procedure.  He denies fever or chills, chest pain or shortness of breath or leg swelling  ED course: BP 163/93 with otherwise normal vitals Blood work remarkable for sodium of 131, blood glucose 291 but otherwise unremarkable Lower extremity venous ultrasound negative for DVT  Patient started on heparin infusion, given fentanyl for pain.  Hospitalist consulted for admission.   Review of Systems: As mentioned in the history of present illness. All other systems reviewed and are negative. Past Medical History:  Diagnosis Date   Allergies    Arthritis    Diabetes mellitus without complication ([place])    Hard of hearing    Hyperlipidemia    Hypertension    Myocardial infarction [facility]) 2008   treated here at [facility] and transferred to [place] for stent placement   Neuropathy    knees down   Peripheral vascular disease ([place])    Sleep apnea    "in the past"   Status post primary angioplasty with coronary stent    Wears dentures    full upper and lower   Past Surgical History:  Procedure Laterality Date   CATARACT EXTRACTION W/PHACO Right 03/14/2021   Procedure: CATARACT EXTRACTION PHACO AND INTRAOCULAR LENS PLACEMENT ([place]) RIGHT DIABETIC;  Surgeon: Leandrew Koyanagi, MD;  Location:  [place];  Service: Ophthalmology;  Laterality: Right;  Diabetic 16.78 01:39.9   CATARACT EXTRACTION W/PHACO Left 03/28/2021   Procedure: CATARACT EXTRACTION PHACO AND INTRAOCULAR LENS PLACEMENT (IOC) LEFT DIABETIC 6.93 01:22.0;  Surgeon: Leandrew Koyanagi, MD;  Location: [place];  Service: Ophthalmology;  Laterality: Left;  Diabetic   LOWER EXTREMITY ANGIOGRAPHY Right 05/14/2021   Procedure: LOWER EXTREMITY ANGIOGRAPHY;  Surgeon: Algernon Huxley, MD;  Location: [place] CV LAB;  Service: Cardiovascular;  Laterality: Right;   TONSILLECTOMY     had removed as a child   Social History:  reports that he has never smoked. He has never used smokeless tobacco. He reports current alcohol use of about 21.0 standard drinks per week. He reports that he does not use drugs.  No Known Allergies  Family History  Problem Relation Age of Onset   Scoliosis Mother    Heart disease Father     Prior to Admission medications   Medication Sig Start Date End Date Taking? Authorizing Provider  HYDROcodone-acetaminophen (NORCO/VICODIN) 5-325 MG tablet Take 1 tablet by mouth every 4 (four) hours as needed for moderate pain. 06/11/21 06/11/22  Kris Hartmann, NP  amLODipine (NORVASC) 5 MG tablet Take 10 mg by mouth daily.  12/30/16   [provider]  aspirin EC 81 MG tablet Take 1 tablet (81 mg total) by mouth daily. 05/14/21   Algernon Huxley, MD  carvedilol (COREG) 12.5 MG tablet Take 1 tablet by  mouth 2 (two) times daily. 12/27/16   [provider]  clopidogrel (PLAVIX) 75 MG tablet Take 1 tablet (75 mg total) by mouth daily. 05/14/21   Algernon Huxley, MD  doxycycline (VIBRA-TABS) 100 MG tablet Take 100 mg by mouth 2 (two) times daily. 05/09/21   [provider]  DULoxetine (CYMBALTA) 30 MG capsule Take 30 mg by mouth daily.    [provider]  fluticasone (FLONASE) 50 MCG/ACT nasal spray Place 2 sprays into both nostrils as needed for allergies or rhinitis.     [provider]  gabapentin (NEURONTIN) 100 MG capsule Take 100 mg by mouth at bedtime.    [provider]  Ibuprofen 200 MG CAPS Take 2 tablets by mouth 3 times/day as needed-between meals & bedtime.    [provider]  insulin aspart protamine- aspart (NOVOLOG MIX 70/30) (70-30) 100 UNIT/ML injection Inject 32 Units into the skin 2 (two) times daily with a meal.    [provider]  lisinopril (PRINIVIL,ZESTRIL) 20 MG tablet Take 1 tablet by mouth daily. 08/12/16 05/14/21  [provider]  metFORMIN (GLUCOPHAGE) 500 MG tablet Take 500 mg by mouth 2 (two) times daily with a meal.    [provider]  pravastatin (PRAVACHOL) 40 MG tablet Take 1 tablet (40 mg total) by mouth at bedtime. 05/14/21 05/12/26  Algernon Huxley, MD    Physical Exam: Vitals:   06/13/21 0001 06/13/21 0002 06/13/21 0003 06/13/21 0005  BP:      Pulse: 76 76 75   Resp:    20  Temp:    99 F (37.2 C)  TempSrc:    Oral  SpO2: 92% 92% 93%   Weight:      Height:       Physical Exam Vitals and nursing note reviewed.  Constitutional:      General: He is not in acute distress.    Appearance: Normal appearance.  HENT:     Head: Normocephalic and atraumatic.  Cardiovascular:     Rate and Rhythm: Normal rate and regular rhythm.     Pulses: Normal pulses.     Heart sounds: Normal heart sounds. No murmur heard.    Comments: Diminished pulses right foot Pulmonary:     Effort: Pulmonary effort is normal.     Breath sounds: Normal breath sounds. No wheezing or rhonchi.  Abdominal:     General: Bowel sounds are normal.     Palpations: Abdomen is soft.     Tenderness: There is no abdominal tenderness.  Musculoskeletal:        General: No swelling or tenderness. Normal range of motion.     Cervical back: Normal range of motion and neck supple.  Skin:    General: Skin is warm and dry.  Neurological:     General: No focal deficit present.     Mental Status: He is alert.  Mental status is at baseline.  Psychiatric:        Mood and Affect: Mood normal.        Behavior: Behavior normal.     Data Reviewed: Relevant notes from primary care and specialist visits, past discharge summaries as available in EHR, including Care Everywhere. Prior diagnostic testing as pertinent to current admission diagnoses Updated medications and problem lists for reconciliation ED course, including vitals, labs, imaging, treatment and response to treatment Triage notes, nursing and pharmacy notes and ED provider's notes Notable results as noted in HPI   Assessment and Plan: *  Lower limb ischemia right leg with history of angioplasty and stent on 05/14/2021- (present on admission) Patient noted to have prolonged capillary refill, decreased pulses lower extremity lower extremity still warm and without cyanosis Continue heparin infusion Vascular consult Resume Plavix.  Patient has been taking Plavix inconsistently since his procedure on 1/23  Atherosclerosis of native arteries of the extremities with ulceration ([place])- (present on admission) Continue aspirin and pravastatin  Diabetes mellitus type 2, insulin dependent (HCC) Sliding scale insulin coverage  Coronary artery disease with history of myocardial infarction without history of CABG Continue aspirin, carvedilol, lisinopril and pravastatin  Hypertension Continue amlodipine, lisinopril and carvedilol       Advance Care Planning:   Code Status: Prior   Consults: vascular  Family Communication: none  Severity of Illness: The appropriate patient status for this patient is INPATIENT. Inpatient status is judged to be reasonable and necessary in order to provide the required intensity of service to ensure the patient's safety. The patient's presenting symptoms, physical exam findings, and initial radiographic and laboratory data in the context of their chronic comorbidities is felt to place them at high risk for  further clinical deterioration. Furthermore, it is not anticipated that the patient will be medically stable for discharge from the hospital within 2 midnights of admission.   * I certify that at the point of admission it is my clinical judgment that the patient will require inpatient hospital care spanning beyond 2 midnights from the point of admission due to high intensity of service, high risk for further deterioration and high frequency of surveillance required.*  Author: Athena Masse, MD 06/13/2021 12:35 AM  For on call review [email or web address].

## 2021-06-13 NOTE — Assessment & Plan Note (Addendum)
Continue Lipitor. Aspirin held secondary to initiation of Eliquis. Follow-up with cardiology. 

## 2021-06-13 NOTE — ED Notes (Signed)
Pt sheets and blankets changed due to IV leaking

## 2021-06-13 NOTE — Consult Note (Signed)
[facility] Vascular Consult Note  MRN : [id number]  Manuel [name] is a 76 y.o. (24-Oct-1945) male who presents with chief complaint of  Chief Complaint  Patient presents with   Leg Pain  .  History of Present Illness: Manuel [name] is a 76 year old male that is well-known to our practice.  He recently underwent intervention on his right lower extremity on 05/14/2021 including:  Procedure(s) Performed:             1.  Ultrasound guidance for vascular access left femoral artery             2.  Catheter placement into right common femoral artery from left femoral approach             3.  Aortogram and selective right lower extremity angiogram             4.  Percutaneous transluminal angioplasty of left common iliac artery with 6 mm diameter by 4 cm length Lutonix drug-coated angioplasty balloon             5.  Percutaneous transluminal angioplasty of the right peroneal artery and tibioperoneal trunk with 2.5 mm diameter by 22 cm length angioplasty balloon             6.  Percutaneous transluminal angioplasty of the right distal SFA and above-knee popliteal artery with 6 mm diameter Lutonix drug-coated angioplasty balloon             7.  Stent placement to the right distal SFA and proximal popliteal artery with a 7 mm diameter by 12 cm length life stent             8.  Percutaneous transluminal angioplasty of the right posterior tibial artery with 2.5 mm diameter by 22 cm length angioplasty balloon             9.  StarClose closure device left femoral artery   On 05/22/2021 the patient's wife contacted our office to inform us that the patient was having pain in his lower extremity and she was instructed that this was not typically uncommon immediately following the procedure due to the extensive intervention that was performed.  It was advised that if the patient wanted something stronger than Tylenol for pain medication to contact her office and we would send  something in however the patient never reached out to contact us again.  The patient was recently seen on 05/07/2021.  The patient had an ABI of 0.63 but a TBI 0.80 on the right lower extremity with strong monophasic tibial waveforms with normal toe waveforms.  This was an improvement from the previous studies done on 05/08/2021.  The patient did not note any rest pain like symptoms or any issues the pain was more so with the wound on his right foot.  The patient noted that he had some improvement to his wound and it was actually bleeding with debridement.  The patient then presented to the office on 06/11/2021, with complaints of pain that suddenly started Thursday evening and it was keeping him up through the night.  The patient came in requesting pain medicine he was sent in hydrocodone.  The patient also had a follow-up visit scheduled for a right lower extremity arterial duplex due to the continued pain that he had been having.  Subsequently the patient presented to the emergency room on 06/13/2021 due to severe pain in his right lower extremity that was ongoing.  The patient  also admitted at that time that the Plavix that was prescribed postintervention he only took a handful of times because he did not "think it was that important".  He notes that since starting on the heparin drip his leg feels much better than it has previously.  Current Facility-Administered Medications  Medication Dose Route Frequency Provider Last Rate Last Admin   0.9 %  sodium chloride infusion   Intravenous Continuous Andris Baumannuncan, Hazel V, MD 75 mL/hr at 06/13/21 0102 New Bag at 06/13/21 0102   acetaminophen (TYLENOL) tablet 650 mg  650 mg Oral Q6H PRN Andris Baumannuncan, Hazel V, MD       Or   acetaminophen (TYLENOL) suppository 650 mg  650 mg Rectal Q6H PRN Andris Baumannuncan, Hazel V, MD       clopidogrel (PLAVIX) tablet 75 mg  75 mg Oral Daily Lindajo Royaluncan, Hazel V, MD       heparin ADULT infusion 100 units/mL (25000 units/26050mL)  1,650 Units/hr Intravenous  Continuous Martyn MalayBeers, Brandon D, RPH 16.5 mL/hr at 06/13/21 1229 1,650 Units/hr at 06/13/21 1229   HYDROcodone-acetaminophen (NORCO/VICODIN) 5-325 MG per tablet 1 tablet  1 tablet Oral Q4H PRN Andris Baumannuncan, Hazel V, MD       HYDROmorphone (DILAUDID) injection 1 mg  1 mg Intravenous Q2H PRN Andris Baumannuncan, Hazel V, MD   1 mg at 06/13/21 0602   insulin aspart (novoLOG) injection 0-5 Units  0-5 Units Subcutaneous QHS Adhikari, Amrit, MD       insulin aspart (novoLOG) injection 0-9 Units  0-9 Units Subcutaneous TID WC Burnadette PopAdhikari, Amrit, MD   3 Units at 06/13/21 1152   ondansetron (ZOFRAN) tablet 4 mg  4 mg Oral Q6H PRN Andris Baumannuncan, Hazel V, MD       Or   ondansetron [facility](ZOFRAN) injection 4 mg  4 mg Intravenous Q6H PRN Andris Baumannuncan, Hazel V, MD       pravastatin (PRAVACHOL) tablet 40 mg  40 mg Oral QHS Andris Baumannuncan, Hazel V, MD   40 mg at 06/13/21 0102   Current Outpatient Medications  Medication Sig Dispense Refill   HYDROcodone-acetaminophen (NORCO/VICODIN) 5-325 MG tablet Take 1 tablet by mouth every 4 (four) hours as needed for moderate pain. 20 tablet 0   amLODipine (NORVASC) 5 MG tablet Take 10 mg by mouth daily.      aspirin EC 81 MG tablet Take 1 tablet (81 mg total) by mouth daily. 150 tablet 2   carvedilol (COREG) 12.5 MG tablet Take 1 tablet by mouth 2 (two) times daily.     clopidogrel (PLAVIX) 75 MG tablet Take 1 tablet (75 mg total) by mouth daily. 30 tablet 11   doxycycline (VIBRA-TABS) 100 MG tablet Take 100 mg by mouth 2 (two) times daily.     DULoxetine (CYMBALTA) 30 MG capsule Take 30 mg by mouth daily.     fluticasone (FLONASE) 50 MCG/ACT nasal spray Place 2 sprays into both nostrils as needed for allergies or rhinitis.     gabapentin (NEURONTIN) 100 MG capsule Take 100 mg by mouth at bedtime.     Ibuprofen 200 MG CAPS Take 2 tablets by mouth 3 times/day as needed-between meals & bedtime.     insulin aspart protamine- aspart (NOVOLOG MIX 70/30) (70-30) 100 UNIT/ML injection Inject 32 Units into the skin 2 (two) times  daily with a meal.     lisinopril (PRINIVIL,ZESTRIL) 20 MG tablet Take 1 tablet by mouth daily.     metFORMIN (GLUCOPHAGE) 500 MG tablet Take 500 mg by mouth 2 (two) times daily with a  meal.     pravastatin (PRAVACHOL) 40 MG tablet Take 1 tablet (40 mg total) by mouth at bedtime. 30 tablet 3    Past Medical History:  Diagnosis Date   Allergies    Arthritis    Diabetes mellitus without complication (HCC)    Hard of hearing    Hyperlipidemia    Hypertension    Myocardial infarction [facility]) 2008   treated here at [facility] and transferred to [place] for stent placement   Neuropathy    knees down   Peripheral vascular disease (HCC)    Sleep apnea    "in the past"   Status post primary angioplasty with coronary stent    Wears dentures    full upper and lower    Past Surgical History:  Procedure Laterality Date   CATARACT EXTRACTION W/PHACO Right 03/14/2021   Procedure: CATARACT EXTRACTION PHACO AND INTRAOCULAR LENS PLACEMENT (IOC) RIGHT DIABETIC;  Surgeon: Lockie Mola, MD;  Location: [facility];  Service: Ophthalmology;  Laterality: Right;  Diabetic 16.78 01:39.9   CATARACT EXTRACTION W/PHACO Left 03/28/2021   Procedure: CATARACT EXTRACTION PHACO AND INTRAOCULAR LENS PLACEMENT (IOC) LEFT DIABETIC 6.93 01:22.0;  Surgeon: Lockie Mola, MD;  Location: [facility];  Service: Ophthalmology;  Laterality: Left;  Diabetic   LOWER EXTREMITY ANGIOGRAPHY Right 05/14/2021   Procedure: LOWER EXTREMITY ANGIOGRAPHY;  Surgeon: Annice Needy, MD;  Location: [facility] INVASIVE CV LAB;  Service: Cardiovascular;  Laterality: Right;   TONSILLECTOMY     had removed as a child    Social History Social History   Tobacco Use   Smoking status: Never   Smokeless tobacco: Never  Vaping Use   Vaping Use: Never used  Substance Use Topics   Alcohol use: Yes    Alcohol/week: 21.0 standard drinks    Types: 7 Glasses of wine, 14 Cans of beer per week    Comment: occassional   Drug  use: No    Family History Family History  Problem Relation Age of Onset   Scoliosis Mother    Heart disease Father     No Known Allergies   REVIEW OF SYSTEMS (Negative unless checked)  Constitutional: [] Weight loss  [] Fever  [] Chills Cardiac: [] Chest pain   [] Chest pressure   [] Palpitations   [] Shortness of breath when laying flat   [] Shortness of breath at rest   [] Shortness of breath with exertion. Vascular:  [] Pain in legs with walking   [x] Pain in legs at rest   [] Pain in legs when laying flat   [] Claudication   [] Pain in feet when walking  [] Pain in feet at rest  [x] Pain in feet when laying flat   [] History of DVT   [] Phlebitis   [] Swelling in legs   [] Varicose veins   [] Non-healing ulcers Pulmonary:   [] Uses home oxygen   [] Productive cough   [] Hemoptysis   [] Wheeze  [] COPD   [] Asthma Neurologic:  [] Dizziness  [] Blackouts   [] Seizures   [] History of stroke   [] History of TIA  [] Aphasia   [] Temporary blindness   [] Dysphagia   [] Weakness or numbness in arms   [] Weakness or numbness in legs Musculoskeletal:  [] Arthritis   [] Joint swelling   [] Joint pain   [] Low back pain Hematologic:  [] Easy bruising  [] Easy bleeding   [] Hypercoagulable state   [] Anemic  [] Hepatitis Gastrointestinal:  [] Blood in stool   [] Vomiting blood  [] Gastroesophageal reflux/heartburn   [] Difficulty swallowing. Genitourinary:  [] Chronic kidney disease   [] Difficult urination  [] Frequent urination  [] Burning with  urination   [] Blood in urine Skin:  [] Rashes   [] Ulcers   [] Wounds Psychological:  [] History of anxiety   []  History of major depression.  Physical Examination  Vitals:   06/13/21 0700 06/13/21 0800 06/13/21 1000 06/13/21 1300  BP: (!) 153/72 (!) 142/91 (!) 151/75 (!) 159/85  Pulse: 74 77 72 71  Resp:  16 14 16   Temp:      TempSrc:      SpO2:  99% 95% 100%  Weight:      Height:       Body mass index is 28.43 kg/m. Gen:  WD/WN, NAD Head: [place]/AT, No temporalis wasting. Prominent temp pulse not  noted.  Vascular: Left foot warm, 3-second capillary refill, nonpalpable pulses bilaterally    CBC Lab Results  Component Value Date   WBC 9.9 06/12/2021   HGB 14.6 06/12/2021   HCT 41.4 06/12/2021   MCV 85.2 06/12/2021   PLT 283 06/12/2021    BMET    Component Value Date/Time   NA 131 (L) 06/12/2021 1958   NA 137 02/16/2012 2013   K 4.0 06/12/2021 1958   K 3.8 02/16/2012 2013   CL 94 (L) 06/12/2021 1958   CL 102 02/16/2012 2013   CO2 27 06/12/2021 1958   CO2 25 02/16/2012 2013   GLUCOSE 291 (H) 06/12/2021 1958   GLUCOSE 154 (H) 02/16/2012 2013   BUN 17 06/12/2021 1958   BUN 10 02/16/2012 2013   CREATININE 0.87 06/12/2021 1958   CREATININE 0.72 02/16/2012 2013   CALCIUM 9.3 06/12/2021 1958   CALCIUM 9.6 02/16/2012 2013   GFRNONAA >60 06/12/2021 1958   GFRNONAA >60 02/16/2012 2013   GFRAA >60 12/09/2018 0929   GFRAA >60 02/16/2012 2013   Estimated Creatinine Clearance: 75.4 mL/min (by C-G formula based on SCr of 0.87 mg/dL).  COAG Lab Results  Component Value Date   INR 1.0 06/12/2021   INR 1.0 04/09/2021    Radiology 06/14/2021 Venous Img Lower Unilateral Right  Result Date: 06/12/2021 CLINICAL DATA:  Recent right lower extremity angiography with pain, initial encounter EXAM: RIGHT LOWER EXTREMITY VENOUS DOPPLER ULTRASOUND TECHNIQUE: Gray-scale sonography with graded compression, as well as color Doppler and duplex ultrasound were performed to evaluate the lower extremity deep venous systems from the level of the common femoral vein and including the common femoral, femoral, profunda femoral, popliteal and calf veins including the posterior tibial, peroneal and gastrocnemius veins when visible. The superficial great saphenous vein was also interrogated. Spectral Doppler was utilized to evaluate flow at rest and with distal augmentation maneuvers in the common femoral, femoral and popliteal veins. COMPARISON:  None. FINDINGS: Contralateral Common Femoral Vein: Respiratory  phasicity is normal and symmetric with the symptomatic side. No evidence of thrombus. Normal compressibility. Common Femoral Vein: No evidence of thrombus. Normal compressibility, respiratory phasicity and response to augmentation. Saphenofemoral Junction: No evidence of thrombus. Normal compressibility and flow on color Doppler imaging. Profunda Femoral Vein: No evidence of thrombus. Normal compressibility and flow on color Doppler imaging. Femoral Vein: No evidence of thrombus. Normal compressibility, respiratory phasicity and response to augmentation. Popliteal Vein: No evidence of thrombus. Normal compressibility, respiratory phasicity and response to augmentation. Calf Veins: No evidence of thrombus. Normal compressibility and flow on color Doppler imaging. Superficial Great Saphenous Vein: No evidence of thrombus. Normal compressibility. Venous Reflux:  None. Other Findings:  None. IMPRESSION: No evidence of deep venous thrombosis. Electronically Signed   By: 12/11/2018 M.D.   On: 06/12/2021 21:02  Assessment/Plan Atherosclerosis obliterans with rest pain  While the patient may not have completely occluded his recent intervention I suspect that there is likely at least a critical stenosis.  I discussed with the patient the importance of taking both Plavix and aspirin following intervention.  We will plan on performing an angiogram on his right lower extremity.  We will continue to have the patient on a heparin drip until planned right lower extremity angiogram.  I have discussed the risk, benefits and alternatives with the patient he agrees to proceed.   Georgiana Spinner, NP  06/13/2021 1:37 PM    This note was created with Dragon medical transcription system.  Any error is purely unintentional

## 2021-06-13 NOTE — Progress Notes (Signed)
Brief same-day note:  Patient is a 76 year old male with medical history significant of insulin-dependent DM, HTN, CAD, PAD s/p angioplasty with stent 05/14/2021 who presents to the ED with right leg pain with ambulation and at rest that has been ongoing since his surgery and getting progressively worse.  He has been using pain medication without significant relief and now he has a pain throughout the night.  He admits to not using his Plavix regularly since the procedure.  He has a history of noncompliance. Right lower extremity ischemia was suspected on admission.  Started on heparin drip.  Vascular surgery consulted. Patient seen and examined at the bedside this am from the emergency room.  During evaluation, hemodynamically stable.  He states his right lower extremity pain is better. We will continue current management

## 2021-06-13 NOTE — Assessment & Plan Note (Addendum)
Continue aspirin, carvedilol, lisinopril and Lipitor

## 2021-06-13 NOTE — ED Notes (Signed)
Pt reports pain in right upper leg.  States pain has worsened over past few days.  Pt was taking pain meds with little relief.  No swelling noted.   Denies chest pain or sob.  Pt reports drinking etoh every day.  Pt alert.   Family with pt

## 2021-06-14 ENCOUNTER — Encounter
Admission: EM | Disposition: A | Discharge status: Home / Self Care | Payer: Self-pay | Source: Home / Self Care | Attending: Internal Medicine

## 2021-06-14 ENCOUNTER — Other Ambulatory Visit (INDEPENDENT_AMBULATORY_CARE_PROVIDER_SITE_OTHER): Payer: Self-pay | Admitting: Nurse Practitioner

## 2021-06-14 DIAGNOSIS — L97519 Non-pressure chronic ulcer of other part of right foot with unspecified severity: Secondary | ICD-10-CM

## 2021-06-14 DIAGNOSIS — I743 Embolism and thrombosis of arteries of the lower extremities: Secondary | ICD-10-CM

## 2021-06-14 DIAGNOSIS — Z9114 Patient's other noncompliance with medication regimen: Secondary | ICD-10-CM

## 2021-06-14 HISTORY — PX: LOWER EXTREMITY ANGIOGRAPHY: CATH118251

## 2021-06-14 LAB — CBC WITH DIFFERENTIAL/PLATELET
Abs Immature Granulocytes: 0.04 10*3/uL (ref 0.00–0.07)
Basophils Absolute: 0.1 10*3/uL (ref 0.0–0.1)
Basophils Relative: 1 %
Eosinophils Absolute: 0.2 10*3/uL (ref 0.0–0.5)
Eosinophils Relative: 3 %
HCT: 42.7 % (ref 39.0–52.0)
Hemoglobin: 15.2 g/dL (ref 13.0–17.0)
Immature Granulocytes: 1 %
Lymphocytes Relative: 30 %
Lymphs Abs: 2.4 10*3/uL (ref 0.7–4.0)
MCH: 30 pg (ref 26.0–34.0)
MCHC: 35.6 g/dL (ref 30.0–36.0)
MCV: 84.4 fL (ref 80.0–100.0)
Monocytes Absolute: 0.8 10*3/uL (ref 0.1–1.0)
Monocytes Relative: 11 %
Neutro Abs: 4.4 10*3/uL (ref 1.7–7.7)
Neutrophils Relative %: 54 %
Platelets: 292 10*3/uL (ref 150–400)
RBC: 5.06 MIL/uL (ref 4.22–5.81)
RDW: 12.5 % (ref 11.5–15.5)
WBC: 7.9 10*3/uL (ref 4.0–10.5)
nRBC: 0 % (ref 0.0–0.2)

## 2021-06-14 LAB — BASIC METABOLIC PANEL
Anion gap: 8 (ref 5–15)
BUN: 10 mg/dL (ref 8–23)
CO2: 26 mmol/L (ref 22–32)
Calcium: 8.8 mg/dL — ABNORMAL LOW (ref 8.9–10.3)
Chloride: 100 mmol/L (ref 98–111)
Creatinine, Ser: 0.66 mg/dL (ref 0.61–1.24)
GFR, Estimated: >60 mL/min (ref >60)
Glucose, Bld: 216 mg/dL — ABNORMAL HIGH (ref 70–99)
Potassium: 3.8 mmol/L (ref 3.5–5.1)
Sodium: 134 mmol/L — ABNORMAL LOW (ref 135–145)

## 2021-06-14 LAB — GLUCOSE, CAPILLARY
Glucose-Capillary: 221 mg/dL — ABNORMAL HIGH (ref 70–99)
Glucose-Capillary: 187 mg/dL — ABNORMAL HIGH (ref 70–99)
Glucose-Capillary: 150 mg/dL — ABNORMAL HIGH (ref 70–99)
Glucose-Capillary: 172 mg/dL — ABNORMAL HIGH (ref 70–99)
Glucose-Capillary: 176 mg/dL — ABNORMAL HIGH (ref 70–99)
Glucose-Capillary: 140 mg/dL — ABNORMAL HIGH (ref 70–99)

## 2021-06-14 LAB — HEPARIN LEVEL (UNFRACTIONATED): Heparin Unfractionated: 0.34 IU/mL (ref 0.30–0.70)

## 2021-06-14 LAB — MRSA NEXT GEN BY PCR, NASAL: MRSA by PCR Next Gen: NOT DETECTED

## 2021-06-14 SURGERY — LOWER EXTREMITY ANGIOGRAPHY
Anesthesia: Moderate Sedation | Laterality: Right

## 2021-06-14 MED ORDER — SODIUM CHLORIDE 0.9 % IV SOLN
1.0000 mg/h | INTRAVENOUS | Status: AC
Start: 2021-06-14 — End: 2021-06-14
  Administered 2021-06-14: 1 mg/h
  Filled 2021-06-14: qty 10

## 2021-06-14 MED ORDER — MIDAZOLAM HCL 2 MG/2ML IJ SOLN
INTRAMUSCULAR | Status: AC
  Filled 2021-06-14: qty 2

## 2021-06-14 MED ORDER — HEPARIN SODIUM (PORCINE) 1000 UNIT/ML IJ SOLN
INTRAMUSCULAR | Status: AC
  Filled 2021-06-14: qty 10

## 2021-06-14 MED ORDER — CEFAZOLIN SODIUM-DEXTROSE 2-4 GM/100ML-% IV SOLN: 2.0000 g | INTRAVENOUS | Status: DC

## 2021-06-14 MED ORDER — FENTANYL CITRATE PF 50 MCG/ML IJ SOSY
PREFILLED_SYRINGE | INTRAMUSCULAR | Status: AC
  Filled 2021-06-14: qty 1

## 2021-06-14 MED ORDER — HEPARIN (PORCINE) 25000 UT/250ML-% IV SOLN: 600.0000 [IU]/h | INTRAVENOUS | Status: DC

## 2021-06-14 MED ORDER — HEPARIN (PORCINE) 25000 UT/250ML-% IV SOLN
600.0000 [IU]/h | INTRAVENOUS | Status: DC
  Administered 2021-06-14: 600 [IU]/h via INTRA_ARTERIAL

## 2021-06-14 MED ORDER — ALTEPLASE 2 MG IJ SOLR
0.5000 mg/h | INTRAMUSCULAR | Status: DC
  Administered 2021-06-14 – 2021-06-15 (×2): 0.5 mg/h
  Filled 2021-06-14 (×2): qty 10

## 2021-06-14 MED ORDER — HEPARIN SODIUM (PORCINE) 1000 UNIT/ML IJ SOLN
INTRAMUSCULAR | Status: DC | PRN
  Administered 2021-06-14: 3000 [IU] via INTRAVENOUS

## 2021-06-14 MED ORDER — CEFAZOLIN SODIUM-DEXTROSE 2-4 GM/100ML-% IV SOLN: 2.0000 g | INTRAVENOUS | Status: AC

## 2021-06-14 MED ORDER — SODIUM CHLORIDE 0.9 % IV SOLN
12.5000 mg | Freq: Four times a day (QID) | INTRAVENOUS | Status: DC | PRN
  Filled 2021-06-14: qty 0.5

## 2021-06-14 MED ORDER — DIPHENHYDRAMINE HCL 50 MG/ML IJ SOLN: 50.0000 mg | Freq: Once | INTRAMUSCULAR | Status: DC | PRN

## 2021-06-14 MED ORDER — SODIUM CHLORIDE 0.9 % IV SOLN: INTRAVENOUS | Status: DC

## 2021-06-14 MED ORDER — HYDRALAZINE HCL 20 MG/ML IJ SOLN
10.0000 mg | Freq: Four times a day (QID) | INTRAMUSCULAR | Status: DC | PRN
  Administered 2021-06-14 – 2021-06-16 (×3): 10 mg via INTRAVENOUS
  Filled 2021-06-14 (×3): qty 1

## 2021-06-14 MED ORDER — FENTANYL CITRATE (PF) 100 MCG/2ML IJ SOLN
INTRAMUSCULAR | Status: DC | PRN
  Administered 2021-06-14 (×2): 50 ug via INTRAVENOUS

## 2021-06-14 MED ORDER — HYDROMORPHONE HCL 1 MG/ML IJ SOLN: 1.0000 mg | Freq: Once | INTRAMUSCULAR | Status: AC | PRN

## 2021-06-14 MED ORDER — MIDAZOLAM HCL 2 MG/2ML IJ SOLN
INTRAMUSCULAR | Status: DC | PRN
  Administered 2021-06-14 (×2): 2 mg via INTRAVENOUS

## 2021-06-14 MED ORDER — HEPARIN (PORCINE) 25000 UT/250ML-% IV SOLN
600.0000 [IU]/h | INTRAVENOUS | Status: DC
  Administered 2021-06-15: 600 [IU]/h via INTRA_ARTERIAL
  Filled 2021-06-14: qty 250

## 2021-06-14 MED ORDER — ALTEPLASE 2 MG IJ SOLR
INTRAMUSCULAR | Status: DC | PRN
  Administered 2021-06-14: 8 mg

## 2021-06-14 MED ORDER — DOUBLE ANTIBIOTIC 500-10000 UNIT/GM EX OINT
TOPICAL_OINTMENT | Freq: Every day | CUTANEOUS | Status: DC
  Filled 2021-06-14 (×2): qty 28.4

## 2021-06-14 MED ORDER — CEFAZOLIN SODIUM-DEXTROSE 2-4 GM/100ML-% IV SOLN
INTRAVENOUS | Status: AC
  Administered 2021-06-14: 2 g via INTRAVENOUS
  Filled 2021-06-14: qty 100

## 2021-06-14 MED ORDER — ONDANSETRON HCL 4 MG/2ML IJ SOLN
INTRAMUSCULAR | Status: AC
  Administered 2021-06-14: 4 mg via INTRAVENOUS
  Filled 2021-06-14: qty 2

## 2021-06-14 MED ORDER — CHLORHEXIDINE GLUCONATE CLOTH 2 % EX PADS: 6.0000 | MEDICATED_PAD | Freq: Every day | CUTANEOUS | Status: DC

## 2021-06-14 MED ORDER — IODIXANOL 320 MG/ML IV SOLN
INTRAVENOUS | Status: DC | PRN
  Administered 2021-06-14: 50 mL via INTRA_ARTERIAL

## 2021-06-14 MED ORDER — MIDAZOLAM HCL 2 MG/ML PO SYRP: 8.0000 mg | ORAL_SOLUTION | Freq: Once | ORAL | Status: DC | PRN

## 2021-06-14 MED ORDER — ONDANSETRON HCL 4 MG/2ML IJ SOLN: 4.0000 mg | Freq: Four times a day (QID) | INTRAMUSCULAR | Status: DC | PRN

## 2021-06-14 MED ORDER — FAMOTIDINE 20 MG PO TABS: 40.0000 mg | ORAL_TABLET | Freq: Once | ORAL | Status: DC | PRN

## 2021-06-14 MED ORDER — INSULIN GLARGINE-YFGN 100 UNIT/ML ~~LOC~~ SOLN
15.0000 [IU] | Freq: Every day | SUBCUTANEOUS | Status: DC
  Administered 2021-06-16: 15 [IU] via SUBCUTANEOUS
  Filled 2021-06-14 (×5): qty 0.15

## 2021-06-14 MED ORDER — HYDROMORPHONE HCL 1 MG/ML IJ SOLN
INTRAMUSCULAR | Status: AC
  Administered 2021-06-14: 1 mg via INTRAVENOUS
  Filled 2021-06-14: qty 1

## 2021-06-14 MED ORDER — HEPARIN (PORCINE) 25000 UT/250ML-% IV SOLN
600.0000 [IU]/h | INTRAVENOUS | Status: DC
Start: 2021-06-14 — End: 2021-06-14

## 2021-06-14 MED ORDER — METHYLPREDNISOLONE SODIUM SUCC 125 MG IJ SOLR: 125.0000 mg | Freq: Once | INTRAMUSCULAR | Status: DC | PRN

## 2021-06-14 SURGICAL SUPPLY — 17 items
BALLON DORADO 5X100X135 (BALLOONS) ×2
BALLOON DORADO 5X100X135 (BALLOONS) IMPLANT
CATH ANGIO 5F PIGTAIL 65CM (CATHETERS) ×1 IMPLANT
CATH BEACON 5 .035 40 KMP TP (CATHETERS) IMPLANT
CATH BEACON 5 .038 40 KMP TP (CATHETERS) ×2
CATH INFUS 135CMX50CM (CATHETERS) ×1 IMPLANT
CATH NAVICROSS ANGLED 135CM (MICROCATHETER) ×1 IMPLANT
COVER PROBE U/S 5X48 (MISCELLANEOUS) ×1 IMPLANT
GLIDEWIRE ADV .035X260CM (WIRE) ×1 IMPLANT
KIT ENCORE 26 ADVANTAGE (KITS) ×1 IMPLANT
PACK ANGIOGRAPHY (CUSTOM PROCEDURE TRAY) ×3 IMPLANT
SHEATH BRITE TIP 5FRX11 (SHEATH) ×1 IMPLANT
SHEATH DESTIN RDC 6FR 45 (SHEATH) ×1 IMPLANT
SUT PROLENE 0 CT 1 30 (SUTURE) ×1 IMPLANT
SYR MEDRAD MARK 7 150ML (SYRINGE) ×1 IMPLANT
TUBING CONTRAST HIGH PRESS 72 (TUBING) ×1 IMPLANT
WIRE GUIDERIGHT .035X150 (WIRE) ×1 IMPLANT

## 2021-06-14 NOTE — Op Note (Signed)
[facility]  Percutaneous Study/Intervention Procedural Note   Date of Surgery: 06/14/2021  Surgeon(s):Sundeep Destin    Assistants:none  Pre-operative Diagnosis: PAD with rest pain, acute on chronic ischemia right lower extremity in a noncompliant patient who is not taking his blood thinners  Post-operative diagnosis:  Same  Procedure(s) Performed:             1.  Ultrasound guidance for vascular access left femoral artery             2.  Catheter placement into right common femoral artery from left femoral approach             3.  Aortogram and selective right lower extremity angiogram             4.  Percutaneous transluminal angioplasty of right SFA and popliteal arteries with 5 mm diameter by 10 cm length high-pressure angioplasty balloon             5.  Catheter directed thrombolytic therapy with 8 mg of tPA in the right SFA and popliteal arteries using a 130 cm total length 50 cm working length thrombolytic catheter was left in place for overnight thrombolytic therapy    EBL: 5 cc  Contrast: 50 cc  Fluoro Time: 4.3 minutes  Moderate Conscious Sedation Time: approximately 41 minutes using 4 mg of Versed and 100 mcg of Fentanyl              Indications:  Patient is a 76 y.o.male with an ischemic right leg after previous intervention in a patient who is not taking his antiplatelet therapy as prescribed. The patient is brought in for angiography for further evaluation and potential treatment.  Due to the limb threatening nature of the situation, angiogram was performed for attempted limb salvage. The patient is aware that if the procedure fails, amputation would be expected.  The patient also understands that even with successful revascularization, amputation may still be required due to the severity of the situation.  Risks and benefits are discussed and informed consent is obtained.   Procedure:  The patient was identified and appropriate procedural time out  was performed.  The patient was then placed supine on the table and prepped and draped in the usual sterile fashion. Moderate conscious sedation was administered during a face to face encounter with the patient throughout the procedure with my supervision of the RN administering medicines and monitoring the patient's vital signs, pulse oximetry, telemetry and mental status throughout from the start of the procedure until the patient was taken to the recovery room. Ultrasound was used to evaluate the left common femoral artery.  It was patent .  A digital ultrasound image was acquired.  A Seldinger needle was used to access the left common femoral artery under direct ultrasound guidance and a permanent image was performed.  A 0.035 J wire was advanced without resistance and a 5Fr sheath was placed.  Pigtail catheter was placed into the aorta and an AP aortogram was performed. This demonstrated that the renal arteries appeared normal.  The left common iliac artery had a very calcific stenosis that appeared hemodynamically significant.  Left external iliac artery was normal.  The aorta and right iliac arteries were calcific but not stenotic. I then crossed the aortic bifurcation and advanced to the right femoral head. Selective right lower extremity angiogram was then performed. This demonstrated an occlusion of the SFA in the proximal segment well above the previously placed stent.  The  previous stent was clearly fractured in the distal thigh where it was very calcific.  There is reconstitution of the above-knee popliteal artery with what appeared to be some disease in the tibioperoneal trunk and proximal posterior tibial artery which was the dominant and only runoff distally. It was felt that our best chance of patency would be to place a lysis catheter to debulk the thrombus overnight and bring the patient back for angiogram tomorrow.  Initially, in the mangled stent the catheter would not cross and so I used a 5 mm  diameter by 10 cm length high-pressure angioplasty balloon inflated to 10 atm for 1 minute in the distal SFA and was proximal popliteal artery.  Following this, I was able to get the lysis catheter down the distal tip was parked in the below-knee popliteal artery with a proximal extension up into the common femoral artery.  The sheath in the 130 cm total length 50 cm working length catheter was secured in place with Prolene sutures.  8 mg of tPA was deployed throughout the catheter which was in the right SFA and popliteal arteries.  The patient was taken to the recovery room in stable condition having tolerated the procedure well.  Findings:               Aortogram: Renal arteries appeared normal.  The left common iliac artery had a very calcific stenosis that appeared hemodynamically significant.  Left external iliac artery was normal.  The aorta and right iliac arteries were calcific but not stenotic.             Right lower Extremity:  This demonstrated an occlusion of the SFA in the proximal segment well above the previously placed stent.  The previous stent was clearly fractured in the distal thigh where it was very calcific.  There is reconstitution of the above-knee popliteal artery with what appeared to be some disease in the tibioperoneal trunk and proximal posterior tibial artery which was the dominant and only runoff distally.    Disposition: Patient was taken to the recovery room in stable condition having tolerated the procedure well.  Complications: None  Leotis Pain 06/14/2021 3:20 PM   This note was created with Dragon Medical transcription system. Any errors in dictation are purely unintentional.

## 2021-06-14 NOTE — Assessment & Plan Note (Addendum)
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  Continue wound care at home.  Wound care consulted

## 2021-06-14 NOTE — Progress Notes (Signed)
PROGRESS NOTE  Manuel [name]  [id number] DOB: [birth year] DOA: 06/12/2021 PCP: Gracelyn Nurse, MD   Brief Narrative: Patient is a 76 year old male with medical history significant of insulin-dependent DM, HTN, CAD, PAD s/p angioplasty with stent 05/14/2021 who presents to the ED with right leg pain with ambulation and at rest that has been ongoing since his surgery and getting progressively worse.  He has been using pain medication without significant relief and now he has a pain throughout the night.  He admits to not using his Plavix regularly since the procedure.  He has a history of noncompliance. Right lower extremity ischemia was suspected on admission.  Started on heparin drip.  Vascular surgery consulted.  Plan for angioplasty today  Assessment & Plan:  Principal Problem:   Lower limb ischemia right leg with history of angioplasty and stent on 05/14/2021 Active Problems:   Hypertension   Coronary artery disease with history of myocardial infarction without history of CABG   Diabetes mellitus type 2, insulin dependent (HCC)   S/P angioplasty with stent   Atherosclerosis of native arteries of the extremities with ulceration (HCC)   Foot ulcer, right (HCC)   Assessment and Plan: * Lower limb ischemia right leg with history of angioplasty and stent on 05/14/2021- (present on admission) On admission, patient noted to have prolonged capillary refill, decreased pulses lower extremity lower extremity still warm and without cyanosis Continue heparin infusion  Patient has been taking Plavix inconsistently since his procedure on 1/23.  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  Continue wound care at home.  We will also consult wound care nurse  Atherosclerosis of native arteries of the extremities with ulceration (HCC)- (present on admission) Continue aspirin and pravastatin  Diabetes mellitus type 2, insulin dependent (HCC) Sliding  scale insulin coverage.Added long acting insulin  Coronary artery disease with history of myocardial infarction without history of CABG Continue aspirin, carvedilol, lisinopril and pravastatin  Hypertension Continue amlodipine, lisinopril and carvedilol              DVT prophylaxis:Heparin IV     Code Status: Full Code  Family Communication: Wife at bedside on 2/23  Patient status:Inpatient  Patient is from :Home  Anticipated discharge VH:QION  Estimated DC date:1-2 days,needs vascular surgery clearance before discharge   Consultants: Vascular surgery  Procedures: None yet  Antimicrobials:  Anti-infectives (From admission, onward)    None       Subjective: Patient seen and examined at bedside this morning.  Hemodynamically stable.  Looks overall comfortable.  Denies any new complaints.  Waiting for procedure from vascular surgery  Objective: Vitals:   06/13/21 2301 06/14/21 0332 06/14/21 0723 06/14/21 1000  BP: (!) 159/79 (!) 173/78 (!) 163/78 (!) 156/87  Pulse: 76 76 77 69  Resp: 18 16 18 18   Temp: 98.6 F (37 C) 98.3 F (36.8 C) 98.2 F (36.8 C)   TempSrc:   Oral   SpO2: 97% 99% 100% 97%  Weight:      Height:        Intake/Output Summary (Last 24 hours) at 06/14/2021 1104 Last data filed at 06/14/2021 0800 Gross per 24 hour  Intake 1506.87 ml  Output 3025 ml  Net -1518.13 ml   Filed Weights   06/12/21 1954 06/13/21 1824  Weight: 84.8 kg 86.2 kg    Examination:  General exam: Overall comfortable, not in distress HEENT: PERRL Respiratory system:  no wheezes or crackles  Cardiovascular system: S1 & S2 heard, RRR.  Gastrointestinal system: Abdomen is nondistended, soft and nontender. Central nervous system: Alert and oriented Extremities: No edema, no clubbing ,no cyanosis Skin: No rashes,no icterus, ulcers on the right foot   Data Reviewed: I have personally reviewed following labs and imaging studies  CBC: Recent Labs  Lab  06/12/21 1958 06/14/21 0542  WBC 9.9 7.9  NEUTROABS 6.5 4.4  HGB 14.6 15.2  HCT 41.4 42.7  MCV 85.2 84.4  PLT 283 123456   Basic Metabolic Panel: Recent Labs  Lab 06/12/21 1958 06/14/21 0542  NA 131* 134*  K 4.0 3.8  CL 94* 100  CO2 27 26  GLUCOSE 291* 216*  BUN 17 10  CREATININE 0.87 0.66  CALCIUM 9.3 8.8*     Recent Results (from the past 240 hour(s))  Resp Panel by RT-PCR (Flu A&B, Covid) Nasopharyngeal Swab     Status: None   Collection Time: 06/12/21 11:57 PM   Specimen: Nasopharyngeal Swab; Nasopharyngeal(NP) swabs in vial transport medium  Result Value Ref Range Status   SARS Coronavirus 2 by RT PCR NEGATIVE NEGATIVE Final    Comment: (NOTE) SARS-CoV-2 target nucleic acids are NOT DETECTED.  The SARS-CoV-2 RNA is generally detectable in upper respiratory specimens during the acute phase of infection. The lowest concentration of SARS-CoV-2 viral copies this assay can detect is 138 copies/mL. A negative result does not preclude SARS-Cov-2 infection and should not be used as the sole basis for treatment or other patient management decisions. A negative result may occur with  improper specimen collection/handling, submission of specimen other than nasopharyngeal swab, presence of viral mutation(s) within the areas targeted by this assay, and inadequate number of viral copies(<138 copies/mL). A negative result must be combined with clinical observations, patient history, and epidemiological information. The expected result is Negative.  Fact Sheet for Patients:  EntrepreneurPulse.com.au  Fact Sheet for Healthcare Providers:  IncredibleEmployment.be  This test is no t yet approved or cleared by the [place] FDA and  has been authorized for detection and/or diagnosis of SARS-CoV-2 by FDA under an Emergency Use Authorization (EUA). This EUA will remain  in effect (meaning this test can be used) for the duration of  the COVID-19 declaration under Section 564(b)(1) of the Act, 21 U.S.C.section 360bbb-3(b)(1), unless the authorization is terminated  or revoked sooner.       Influenza A by PCR NEGATIVE NEGATIVE Final   Influenza B by PCR NEGATIVE NEGATIVE Final    Comment: (NOTE) The Xpert Xpress SARS-CoV-2/FLU/RSV plus assay is intended as an aid in the diagnosis of influenza from Nasopharyngeal swab specimens and should not be used as a sole basis for treatment. Nasal washings and aspirates are unacceptable for Xpert Xpress SARS-CoV-2/FLU/RSV testing.  Fact Sheet for Patients: EntrepreneurPulse.com.au  Fact Sheet for Healthcare Providers: IncredibleEmployment.be  This test is not yet approved or cleared by the [place] FDA and has been authorized for detection and/or diagnosis of SARS-CoV-2 by FDA under an Emergency Use Authorization (EUA). This EUA will remain in effect (meaning this test can be used) for the duration of the COVID-19 declaration under Section 564(b)(1) of the Act, 21 U.S.C. section 360bbb-3(b)(1), unless the authorization is terminated or revoked.  Performed at [facility], [address], [place], [place] [address]      Radiology Studies: US Venous Img Lower Unilateral Right  Result Date: 06/12/2021 CLINICAL DATA:  Recent right lower extremity angiography with pain, initial encounter EXAM: RIGHT LOWER EXTREMITY VENOUS DOPPLER  ULTRASOUND TECHNIQUE: Gray-scale sonography with graded compression, as well as color Doppler and duplex ultrasound were performed to evaluate the lower extremity deep venous systems from the level of the common femoral vein and including the common femoral, femoral, profunda femoral, popliteal and calf veins including the posterior tibial, peroneal and gastrocnemius veins when visible. The superficial great saphenous vein was also interrogated. Spectral Doppler was utilized to evaluate flow at rest  and with distal augmentation maneuvers in the common femoral, femoral and popliteal veins. COMPARISON:  None. FINDINGS: Contralateral Common Femoral Vein: Respiratory phasicity is normal and symmetric with the symptomatic side. No evidence of thrombus. Normal compressibility. Common Femoral Vein: No evidence of thrombus. Normal compressibility, respiratory phasicity and response to augmentation. Saphenofemoral Junction: No evidence of thrombus. Normal compressibility and flow on color Doppler imaging. Profunda Femoral Vein: No evidence of thrombus. Normal compressibility and flow on color Doppler imaging. Femoral Vein: No evidence of thrombus. Normal compressibility, respiratory phasicity and response to augmentation. Popliteal Vein: No evidence of thrombus. Normal compressibility, respiratory phasicity and response to augmentation. Calf Veins: No evidence of thrombus. Normal compressibility and flow on color Doppler imaging. Superficial Great Saphenous Vein: No evidence of thrombus. Normal compressibility. Venous Reflux:  None. Other Findings:  None. IMPRESSION: No evidence of deep venous thrombosis. Electronically Signed   By: Inez Catalina M.D.   On: 06/12/2021 21:02    Scheduled Meds:  amLODipine  10 mg Oral Daily   carvedilol  12.5 mg Oral BID WC   clopidogrel  75 mg Oral Daily   insulin aspart  0-5 Units Subcutaneous QHS   insulin aspart  0-9 Units Subcutaneous TID WC   insulin glargine-yfgn  15 Units Subcutaneous Daily   lisinopril  20 mg Oral Daily   pravastatin  40 mg Oral QHS   Continuous Infusions:  sodium chloride 75 mL/hr at 06/13/21 1555   heparin 1,650 Units/hr (06/14/21 0454)     LOS: 1 day   Shelly Coss, MD [facility] P2/23/2023, 11:04 AM

## 2021-06-14 NOTE — Plan of Care (Signed)

## 2021-06-14 NOTE — Progress Notes (Signed)
Transport arrived and patient transported to surgery. Telephone SBAR given to [employer] fast streamer. Patient has further questions about procedure- consent not signed at this time. Family at bedside and informed. No current signs of distress. Patient agreeable to plan of care.

## 2021-06-14 NOTE — TOC Initial Note (Signed)
Transition of Care [facility]) - Initial/Assessment Note    Patient Details  Name: Manuel [name] MRN: [id number] Date of Birth: August 10, 1945  Transition [facility] CM/SW Contact:    Kerin Salen, RN Phone Number: 06/14/2021, 3:03 PM  Clinical Narrative:   Transition of Care [facility]) Screening Note   Patient Details  Name: Manuel [name] Date of Birth: April 22, 1946   Transition of Care [facility] CM/SW Contact:    Kerin Salen, RN Phone Number: 06/14/2021, 3:04 PM    Transition of Care Department [facility]) has reviewed patient and no TOC needs have been identified at this time. We will continue to monitor patient advancement through interdisciplinary progression rounds. If new patient transition needs arise, please place a TOC consult.                         Patient Goals and CMS Choice        Expected Discharge Plan and Services                                                Prior Living Arrangements/Services                       Activities of Daily Living Home Assistive Devices/Equipment: None ADL Screening (condition at time of admission) Patient's cognitive ability adequate to safely complete daily activities?: Yes Is the patient deaf or have difficulty hearing?: No Does the patient have difficulty seeing, even when wearing glasses/contacts?: No Does the patient have difficulty concentrating, remembering, or making decisions?: No Patient able to express need for assistance with ADLs?: Yes Does the patient have difficulty dressing or bathing?: No Independently performs ADLs?: Yes (appropriate for developmental age) Does the patient have difficulty walking or climbing stairs?: No Weakness of Legs: None Weakness of Arms/Hands: None  Permission Sought/Granted                  Emotional Assessment              Admission diagnosis:  Lower limb ischemia [I99.8] Peripheral artery disease ([place]) [I73.9] Right leg claudication ([place])  [I73.9] Patient Active Problem List   Diagnosis Date Noted   Foot ulcer, right ([place]) 06/14/2021   Lower limb ischemia right leg with history of angioplasty and stent on 05/14/2021 06/13/2021   Alcohol abuse 06/07/2021   Coronary artery disease with history of myocardial infarction without history of CABG 05/08/2021   S/P angioplasty with stent 05/08/2021   Atherosclerosis of native arteries of the extremities with ulceration ([place]) 05/08/2021   Depression, 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   Diabetic polyneuropathy associated with type 2 diabetes mellitus ([place]) 09/25/2017   Lumbar radiculopathy 01/27/2017   Lumbar degenerative disc disease 01/27/2017   Lumbar spondylosis 01/27/2017   Lumbar facet arthropathy 01/27/2017   Diabetes mellitus type 2, insulin dependent ([place]) 10/18/2014   Hypertension 10/05/2014   Bilateral carotid artery stenosis 04/05/2014   Microalbuminuria 12/19/2013   Gout 09/21/2013   Mixed hyperlipidemia 09/14/2013   PCP:  Baxter Hire, MD Pharmacy:   [facility] [address] [place] [address] [place] [address] Phone: [phone] Fax: [place], [place]  [phone] Phone: [phone] Fax: [phone]     Social Determinants of Health (SDOH) Interventions    Readmission Risk Interventions No flowsheet data found.

## 2021-06-14 NOTE — Progress Notes (Signed)
Inpatient Diabetes Program Recommendations  AACE/ADA: New Consensus Statement on Inpatient Glycemic Control (2015)  Target Ranges:  Prepandial:   less than 140 mg/dL      Peak postprandial:   less than 180 mg/dL (1-2 hours)      Critically ill patients:  140 - 180 mg/dL    Latest Reference Range & Units 06/13/21 08:57 06/13/21 11:43 06/13/21 20:09  Glucose-Capillary 70 - 99 mg/dL 293 (H)  5 units Novolog  214 (H)  3 units Novolog  273 (H)  3 units Novolog     Latest Reference Range & Units 06/14/21 08:14  Glucose-Capillary 70 - 99 mg/dL 221 (H)  3 units Novolog       Home DM Meds: Novolog 70/30 Insulin 32 units BID     Metformin 500 mg BID (NOT taking)  Current Orders: Novolog 0-9 units TID & HS     MD- Note AM CBGs are elevated >200.  Takes 70/30 Insulin at home.    NPO this AM  Please consider starting Semglee 15 units Daily (~0.15 units/kg based on weight of 86 kg)  Can restart home 70/30 insulin at time of discharge     --Will follow patient during hospitalization--  Wyn Quaker RN, MSN, CDE Diabetes Coordinator Inpatient Glycemic Control Team Team Pager: [phone] (8a-5p)

## 2021-06-14 NOTE — Progress Notes (Addendum)
ANTICOAGULATION CONSULT NOTE  Pharmacy Consult for heparin infusion Indication: PAD  No Known Allergies  Patient Measurements: Height: 5\' 8"  (172.7 cm) Weight: 86.2 kg (190 lb 1.6 oz) IBW/kg (Calculated) : 68.4 Heparin Dosing Weight: 84.8 kg  Vital Signs: Temp: 98.3 F (36.8 C) (02/23 0332) Temp Source: Oral (02/22 2009) BP: 173/78 (02/23 0332) Pulse Rate: 76 (02/23 0332)  Labs: Recent Labs    06/12/21 1958 06/13/21 0927 06/13/21 1941 06/14/21 0542  HGB 14.6  --   --  15.2  HCT 41.4  --   --  42.7  PLT 283  --   --  292  APTT 33  --   --   --   LABPROT 13.5  --   --   --   INR 1.0  --   --   --   HEPARINUNFRC  --  <0.10* 0.34 0.34  CREATININE 0.87  --   --  0.66     Estimated Creatinine Clearance: 82.6 mL/min (by C-G formula based on SCr of 0.66 mg/dL).   Medical History: Past Medical History:  Diagnosis Date   Allergies    Arthritis    Diabetes mellitus without complication (HCC)    Hard of hearing    Hyperlipidemia    Hypertension    Myocardial infarction [facility]) 2008   treated here at [facility] and transferred to [place] for stent placement   Neuropathy    knees down   Peripheral vascular disease (HCC)    Sleep apnea    "in the past"   Status post primary angioplasty with coronary stent    Wears dentures    full upper and lower  Meds: Heparin Dosing Weight: 84.8 kg PTA: Plavix & ASA Inpatient: Heparin gtt  Assessment: Pt is 76 yo male w/ h/o DM, HTN, CAD, PAD presenting to ED c/o worsening R leg pain s/p angioplasty w/ stent 05/14/21. Pt reports non-compliance with plavix since procedure (5 doses per month).  right lower extremity angiogram pending  Date    Time    HL     Rate/Comment 2/22     1941   0.34   1650 u/hr; therapeutic x 1 2/23     0542   0.34   1650 u/hr; therapeutic x 1  Goal of Therapy:  Heparin level 0.3-0.7 units/ml Monitor platelets by anticoagulation protocol: Yes   Plan: Heparin level therapeutic x 2 Continue heparin infusion  at 1650 u/hr Will recheck heparin level tomorrow with AM labs Continue to monitor CBC daily while on heparin per protocol  3/23, PharmD Candidate 06/14/2021 7:08 AM

## 2021-06-14 NOTE — Consult Note (Addendum)
WOC Nurse Consult Note: Reason for Consult: Consult requested to provide topical treatment orders for right foot wounds. Pt states he just changed his dressings this morning and declines further assessment at this time.  Discussed plan of care; he is well informed and states he is followed as an outpatient by Dr Excell Seltzer of the podiatry team.  They have ordered Betadine and antibiotic ointment to be applied to chronic full thickness wounds to right heel and right anterior plantar foot, near toes. He states he had debridement performed when he was seen by the podiatrist 2 weeks ago.  Dressing procedure/placement/frequency: Continue present plan of care. Topical treatment orders provided as follows: Apply Betadine swab and antibiotic ointment to right foot wounds Q day, then cover with gauze and kerlex.  **Pt may perform his own dressing changes. Pt states he will resume follow-up with Dr Excell Seltzer of podiatry after discharge. Please re-consult if further assistance is needed.  Thank-you,  Cammie Mcgee MSN, RN, CWOCN, [place], CNS [phone]

## 2021-06-14 NOTE — Hospital Course (Addendum)
Patient is a 76 year old male with medical history significant of insulin-dependent DM, HTN, CAD, PAD s/p angioplasty with stent 05/14/2021 who presents to the ED with right leg pain with ambulation and at rest that has been ongoing since his surgery and getting progressively worse.  He has been using pain medication without significant relief and now he has a pain throughout the night.  He admits to not using his Plavix regularly since the procedure.  He has a history of noncompliance. Right lower extremity ischemia was suspected on admission.  Started on heparin drip.  Vascular surgery consulted.  Underwent angioplasty with mechanical thrombectomy, stent placement by vascular surgery.  Hospital course remarkable for new onset severe left flank pain.

## 2021-06-15 ENCOUNTER — Encounter: Payer: Self-pay | Admitting: Vascular Surgery

## 2021-06-15 ENCOUNTER — Encounter
Admission: EM | Disposition: A | Discharge status: Home / Self Care | Payer: Self-pay | Source: Home / Self Care | Attending: Internal Medicine

## 2021-06-15 ENCOUNTER — Inpatient Hospital Stay: Payer: Medicare Other | Admitting: Anesthesiology

## 2021-06-15 DIAGNOSIS — T82868A Thrombosis of vascular prosthetic devices, implants and grafts, initial encounter: Secondary | ICD-10-CM

## 2021-06-15 DIAGNOSIS — T82858A Stenosis of vascular prosthetic devices, implants and grafts, initial encounter: Secondary | ICD-10-CM

## 2021-06-15 HISTORY — PX: LOWER EXTREMITY INTERVENTION: CATH118252

## 2021-06-15 LAB — CBC
HCT: 41.6 % (ref 39.0–52.0)
Hemoglobin: 14.7 g/dL (ref 13.0–17.0)
MCH: 29.6 pg (ref 26.0–34.0)
MCHC: 35.3 g/dL (ref 30.0–36.0)
MCV: 83.9 fL (ref 80.0–100.0)
Platelets: 236 10*3/uL (ref 150–400)
RBC: 4.96 MIL/uL (ref 4.22–5.81)
RDW: 12.4 % (ref 11.5–15.5)
WBC: 7.8 10*3/uL (ref 4.0–10.5)
nRBC: 0 % (ref 0.0–0.2)

## 2021-06-15 LAB — HEPARIN LEVEL (UNFRACTIONATED): Heparin Unfractionated: <0.1 IU/mL — ABNORMAL LOW (ref 0.30–0.70)

## 2021-06-15 LAB — GLUCOSE, CAPILLARY
Glucose-Capillary: 135 mg/dL — ABNORMAL HIGH (ref 70–99)
Glucose-Capillary: 209 mg/dL — ABNORMAL HIGH (ref 70–99)
Glucose-Capillary: 180 mg/dL — ABNORMAL HIGH (ref 70–99)
Glucose-Capillary: 167 mg/dL — ABNORMAL HIGH (ref 70–99)
Glucose-Capillary: 260 mg/dL — ABNORMAL HIGH (ref 70–99)

## 2021-06-15 SURGERY — LOWER EXTREMITY INTERVENTION
Anesthesia: General | Laterality: Right

## 2021-06-15 MED ORDER — LABETALOL HCL 5 MG/ML IV SOLN
INTRAVENOUS | Status: AC
  Filled 2021-06-15: qty 4

## 2021-06-15 MED ORDER — SODIUM CHLORIDE 0.9 % IV SOLN
INTRAVENOUS | Status: DC
Start: 2021-06-15 — End: 2021-06-16

## 2021-06-15 MED ORDER — TIROFIBAN HCL IN NACL 5-0.9 MG/100ML-% IV SOLN
0.1500 ug/kg/min | INTRAVENOUS | Status: AC
  Administered 2021-06-15 – 2021-06-16 (×2): 0.15 ug/kg/min via INTRAVENOUS
  Filled 2021-06-15 (×3): qty 100

## 2021-06-15 MED ORDER — MIDAZOLAM HCL 2 MG/2ML IJ SOLN
INTRAMUSCULAR | Status: DC | PRN
  Administered 2021-06-15: 1 mg via INTRAVENOUS
  Administered 2021-06-15: .5 mg via INTRAVENOUS
  Administered 2021-06-15: 2 mg via INTRAVENOUS
  Administered 2021-06-15: .5 mg via INTRAVENOUS
  Administered 2021-06-15: 1 mg via INTRAVENOUS

## 2021-06-15 MED ORDER — MIDAZOLAM HCL 2 MG/2ML IJ SOLN
INTRAMUSCULAR | Status: AC
  Filled 2021-06-15: qty 4

## 2021-06-15 MED ORDER — APIXABAN 5 MG PO TABS
5.0000 mg | ORAL_TABLET | Freq: Two times a day (BID) | ORAL | Status: DC
  Administered 2021-06-16 – 2021-06-17 (×3): 5 mg via ORAL
  Filled 2021-06-15 (×4): qty 1

## 2021-06-15 MED ORDER — PROPOFOL 10 MG/ML IV BOLUS
INTRAVENOUS | Status: AC
  Filled 2021-06-15: qty 20

## 2021-06-15 MED ORDER — LABETALOL HCL 5 MG/ML IV SOLN
INTRAVENOUS | Status: DC | PRN
  Administered 2021-06-15: 10 mg via INTRAVENOUS

## 2021-06-15 MED ORDER — MIDAZOLAM HCL 2 MG/2ML IJ SOLN
INTRAMUSCULAR | Status: AC
  Filled 2021-06-15: qty 2

## 2021-06-15 MED ORDER — HEPARIN SODIUM (PORCINE) 1000 UNIT/ML IJ SOLN
INTRAMUSCULAR | Status: DC | PRN
  Administered 2021-06-15: 3000 [IU] via INTRAVENOUS

## 2021-06-15 MED ORDER — FENTANYL CITRATE (PF) 100 MCG/2ML IJ SOLN
INTRAMUSCULAR | Status: DC | PRN
Start: 2021-06-15 — End: 2021-06-15
  Administered 2021-06-15 (×2): 50 ug via INTRAVENOUS
  Administered 2021-06-15: 25 ug via INTRAVENOUS

## 2021-06-15 MED ORDER — CEFAZOLIN SODIUM-DEXTROSE 2-4 GM/100ML-% IV SOLN: 2.0000 g | Freq: Once | INTRAVENOUS | Status: AC

## 2021-06-15 MED ORDER — FENTANYL CITRATE PF 50 MCG/ML IJ SOSY
PREFILLED_SYRINGE | INTRAMUSCULAR | Status: AC
  Filled 2021-06-15: qty 2

## 2021-06-15 MED ORDER — TIROFIBAN HCL IV 12.5 MG/250 ML
INTRAVENOUS | Status: AC
  Administered 2021-06-15: 2155 ug via INTRAVENOUS
  Filled 2021-06-15: qty 250

## 2021-06-15 MED ORDER — FENTANYL CITRATE (PF) 100 MCG/2ML IJ SOLN
INTRAMUSCULAR | Status: AC
Start: 2021-06-15 — End: ?
  Filled 2021-06-15: qty 2

## 2021-06-15 MED ORDER — ATORVASTATIN CALCIUM 20 MG PO TABS
80.0000 mg | ORAL_TABLET | Freq: Every day | ORAL | Status: DC
  Administered 2021-06-15 – 2021-06-17 (×3): 80 mg via ORAL
  Filled 2021-06-15 (×3): qty 4

## 2021-06-15 MED ORDER — HEPARIN SODIUM (PORCINE) 1000 UNIT/ML IJ SOLN
INTRAMUSCULAR | Status: AC
  Filled 2021-06-15: qty 10

## 2021-06-15 MED ORDER — HYDROMORPHONE HCL 1 MG/ML IJ SOLN
INTRAMUSCULAR | Status: AC
  Filled 2021-06-15: qty 1

## 2021-06-15 MED ORDER — PNEUMOCOCCAL VAC POLYVALENT 25 MCG/0.5ML IJ INJ
0.5000 mL | INJECTION | INTRAMUSCULAR | Status: DC
Start: 2021-06-16 — End: 2021-06-17
  Filled 2021-06-15: qty 0.5

## 2021-06-15 MED ORDER — IODIXANOL 320 MG/ML IV SOLN
INTRAVENOUS | Status: DC | PRN
Start: 2021-06-15 — End: 2021-06-15
  Administered 2021-06-15: 45 mL

## 2021-06-15 MED ORDER — TIROFIBAN (AGGRASTAT) BOLUS VIA INFUSION
25.0000 ug/kg | Freq: Once | INTRAVENOUS | Status: AC
  Filled 2021-06-15: qty 44

## 2021-06-15 MED ORDER — FENTANYL CITRATE PF 50 MCG/ML IJ SOSY
PREFILLED_SYRINGE | INTRAMUSCULAR | Status: AC
  Filled 2021-06-15: qty 1

## 2021-06-15 MED ORDER — CEFAZOLIN SODIUM-DEXTROSE 2-4 GM/100ML-% IV SOLN
INTRAVENOUS | Status: AC
  Administered 2021-06-15: 2 g via INTRAVENOUS
  Filled 2021-06-15: qty 100

## 2021-06-15 SURGICAL SUPPLY — 17 items
BALLN DORADO 7X200X80 (BALLOONS) ×2
BALLN ULTRVRSE 3X150X150 (BALLOONS) ×2
BALLOON DORADO 7X200X80 (BALLOONS) IMPLANT
BALLOON ULTRVRSE 3X150X150 (BALLOONS) IMPLANT
CANISTER PENUMBRA ENGINE (MISCELLANEOUS) ×1 IMPLANT
CATH LIGHTNING 7 XTORQ 130 (CATHETERS) ×1 IMPLANT
CATH VERT 5X100 (CATHETERS) ×1 IMPLANT
DEVICE SAFEGUARD 24CM (GAUZE/BANDAGES/DRESSINGS) ×1 IMPLANT
DEVICE STARCLOSE SE CLOSURE (Vascular Products) ×1 IMPLANT
GLIDEWIRE ADV .035X260CM (WIRE) ×1 IMPLANT
GUIDEWIRE PFTE-COATED .018X300 (WIRE) ×1 IMPLANT
KIT ENCORE 26 ADVANTAGE (KITS) ×1 IMPLANT
PACK ANGIOGRAPHY (CUSTOM PROCEDURE TRAY) ×1 IMPLANT
SHEATH PINNACLE MP 7F 45CM (SHEATH) ×1 IMPLANT
STENT VIABAHN 8X250X120 (Permanent Stent) ×1 IMPLANT
STENT VIABAHN 8X7.5X120 (Permanent Stent) ×1 IMPLANT
WIRE G V18X300CM (WIRE) ×1 IMPLANT

## 2021-06-15 NOTE — Op Note (Signed)
[name]  Percutaneous Study/Intervention Procedural Note   Date of Surgery: 06/15/2021  Surgeon(s):Raylie Maddison    Assistants:none  Pre-operative Diagnosis: PAD with rest [name] RLE, s/p thrombolytic therapy  Post-operative diagnosis:  Same  Procedure(s) Performed:             1.  Right lower extremity angiogram             2.  Mechanical thrombectomy to the right SFA with the penumbra CAT 7 device             3.  Viabahn stent placement to the right popliteal artery and SFA with an 8 mm diameter by 25 cm length Viabahn stent and an 8 mm diameter by 7.5 cm length Viabahn stent             4.  Percutaneous transluminal angioplasty of right tibioperoneal trunk and posterior tibial artery with 3 mm diameter by 15 cm length angioplasty balloon             5.  StarClose closure device left femoral artery  EBL: 150 cc  Contrast: 45 cc  Fluoro Time: 7.9 minutes  Moderate Conscious Sedation Time: approximately 49 minutes using 5 mg of Versed and 125 mcg of Fentanyl              Indications:  Patient is a 76 y.o.male with an ischemic right leg status post overnight thrombolytic therapy.  The patient is brought in for angiography for further evaluation and potential treatment.  Due to the limb threatening nature of the situation, angiogram was performed for attempted limb salvage. The patient is aware that if the procedure fails, amputation would be expected.  The patient also understands that even with successful revascularization, amputation may still be required due to the severity of the situation.  Risks and benefits are discussed and informed consent is obtained.   Procedure:  The patient was identified and appropriate procedural time out was performed.  The patient was then placed supine on the table and prepped and draped in the usual sterile fashion. Moderate conscious sedation was administered during a face to face encounter with the patient throughout the  procedure with my supervision of the RN administering medicines and monitoring the patient's vital signs, pulse oximetry, telemetry and mental status throughout from the start of the procedure until the patient was taken to the recovery room.  The existing thrombolytic catheter was removed over a wire and imaging was performed through the sheath.  Selective right lower extremity angiogram was then performed. This demonstrated that the common femoral profunda femoris artery had good flow.  There was thrombus in the proximal SFA above a greater than 85% stenosis well above the previously placed stents.  There is also a small amount of thrombus within the previously placed stents and the high-grade residual stenosis from the fracture stent even after angioplasty yesterday.  The popliteal artery normalized in the midsegment.  The tibioperoneal trunk and posterior tibial artery had greater than 70% stenosis and were the dominant runoff distally.  I upsized to a 7 [place] destination sheath.  I began by performing mechanical thrombectomy to debulk the thrombus from the right SFA with the penumbra CAT 7 device.  2 passes were made with this and following this, there is really no significant residual thrombus.  I then proceeded with covered stent placement to the right SFA and popliteal arteries to treat the previously placed fractured stent in the 2 areas of high-grade residual  stenosis.  An 8 mm diameter by 25 cm length and 8 mm diameter by 7.5 cm length Viabahn stent were deployed from the proximal SFA to the above-knee popliteal artery.  These were postdilated with 7 mm diameter high-pressure angioplasty balloons with excellent angiographic completion result and less than 10% residual stenosis.  I then turned my attention to the tibial disease.  A 0.018 advantage wire with the help of the Kumpe catheter were used to cross the tibioperoneal trunk and posterior tibial lesions without difficulty.  A 3 mm diameter by 15 cm  length angioplasty balloon was inflated to 10 atm for 1 minute in the right posterior tibial artery and tibioperoneal trunk.  Completion imaging showed only about a 20% residual stenosis.  I elected to terminate the procedure. The sheath was removed and StarClose closure device was deployed in the left femoral artery with excellent hemostatic result. The patient was taken to the recovery room in stable condition having tolerated the procedure well.  Findings:                        Right Lower Extremity: Common femoral profunda femoris artery had good flow.  There was thrombus in the proximal SFA above a greater than 85% stenosis well above the previously placed stents.  There is also a small amount of thrombus within the previously placed stents and the high-grade residual stenosis from the fracture stent even after angioplasty yesterday.  The popliteal artery normalized in the midsegment.  The tibioperoneal trunk and posterior tibial artery had greater than 70% stenosis and were the dominant runoff distally.   Disposition: Patient was taken to the recovery room in stable condition having tolerated the procedure well.  Complications: None  Manuel [name] 06/15/2021 4:25 PM   This note was created with Dragon Medical transcription system. Any errors in dictation are purely unintentional.

## 2021-06-15 NOTE — Anesthesia Preprocedure Evaluation (Deleted)
Anesthesia Evaluation  Patient identified by MRN, date of birth, ID band Patient awake    Reviewed: Allergy & Precautions, NPO status , Patient's Chart, lab work & pertinent test results, reviewed documented beta blocker date and time   Airway Mallampati: II  TM Distance: >3 FB Neck ROM: full    Dental   Pulmonary sleep apnea ,    Pulmonary exam normal        Cardiovascular hypertension, Pt. on medications and Pt. on home beta blockers + Past MI (2008), + Cardiac Stents and + Peripheral Vascular Disease (Lower limb ischemia right leg with history of angioplasty and stent on 05/14/2021; bilateral carotid stenosis, less than 50%)  Normal cardiovascular exam  Bilateral carotid artery stenosis  EKG 04/09/21: Normal sinus rhythm Minimal voltage criteria for LVH, may be normal variant ( R in aVL ) Inferior infarct , age undetermined Anterior infarct , age undetermined Abnormal ECG  Myocardial perfusion 11/11/18:  LVEF= 45%  Regional wall motion: demonstrates hypokinesis of the inferior wall.  The overall quality of the study is fair.   Artifacts noted: no  Left ventricular cavity: normal.  Perfusion Analysis: SPECT images demonstrate moderate perfusion abnormality of moderate intensity is present in the inferior region on the stress images. Resting images show moderate perfusion abnormality of moderate intensity of the inferior myocardial region with slight improvements consistent with previous infarct and/or scar as well as with minimal amount of myocardial ischemia and reversibility defect type : Mixed   Neuro/Psych  Neuromuscular disease (Diabetic polyneuropathy) negative psych ROS   GI/Hepatic negative GI ROS, (+)     substance abuse  alcohol use,   Endo/Other  diabetes, Type 2, Insulin Dependent  Renal/GU negative Renal ROS  negative genitourinary   Musculoskeletal Low back pain   Abdominal   Peds   Hematology negative hematology ROS (+)   Anesthesia Other Findings Cardiology note 01/03/21:  Plan   1. Coronary artery disease: Status post PCI and stent of RCA in 2011. Patient is currently without anginal or anginal equivalent type symptoms. He currently has pravastatin for secondary ASCVD risk reduction. Patient has not been compliant with use of pravastatin at this time as he states that he frequently forgets to take it before bedtime. Patient is also taking lisinopril, carvedilol, amlodipine for further CV risk reduction. -Patient encouraged to restart taking his pravastatin, patient states that he will try to do better and take this regularly  2. Hypertension: Patient's blood pressure is well controlled in the office today at 126/70. He currently takes a combination of carvedilol, amlodipine, lisinopril for management with no apparent side effects to medication at this time. -No changes to medical management at this time  3. Depression: Patient is encouraged to see his PCP for concerns of depressive symptoms. Patient is not currently suicidal or having any suicidal ideations. He is not taking his Cymbalta. Patient is encouraged to go see his primary care for further management of depressive symptoms for improvements in quality of life.  4. Peripheral vascular disease: Patient has peripheral vascular disease including carotid atherosclerosis, aortic atherosclerosis, cerebrovascular disease and claudication without significant symptoms or complications. Patient is currently using carvedilol, pravastatin, lisinopril, amlodipine for management. -Continue current management with no change  No orders of the defined types were placed in this encounter.  Return in about 6 months (around 07/03/2021).    Reproductive/Obstetrics negative OB ROS  Anesthesia Physical Anesthesia Plan  ASA: 3  Anesthesia Plan: General   Post-op Pain Management:     Induction: Intravenous  PONV Risk Score and Plan: Propofol infusion and TIVA  Airway Management Planned:   Additional Equipment:   Intra-op Plan:   Post-operative Plan:   Informed Consent:   Plan Discussed with: Anesthesiologist, CRNA and Surgeon  Anesthesia Plan Comments:         Anesthesia Quick Evaluation

## 2021-06-15 NOTE — Progress Notes (Signed)
PROGRESS NOTE  Manuel [name]  [id number] DOB: [birth year] DOA: 06/12/2021 PCP: Baxter Hire, MD   Brief Narrative: Patient is a 76 year old male with medical history significant of insulin-dependent DM, HTN, CAD, PAD s/p angioplasty with stent 05/14/2021 who presents to the ED with right leg pain with ambulation and at rest that has been ongoing since his surgery and getting progressively worse.  He has been using pain medication without significant relief and now he has a pain throughout the night.  He admits to not using his Plavix regularly since the procedure.  He has a history of noncompliance. Right lower extremity ischemia was suspected on admission.  Started on heparin drip.  Vascular surgery consulted.  Underwent angioplasty on 2/23 with catheter directed thrombolytic therapy.  Vascular surgery planning for repeat angiogram today.  Assessment & Plan:  Principal Problem:   Lower limb ischemia right leg with history of angioplasty and stent on 05/14/2021 Active Problems:   Hypertension   Coronary artery disease with history of myocardial infarction without history of CABG   Diabetes mellitus type 2, insulin dependent (HCC)   S/P angioplasty with stent   Atherosclerosis of native arteries of the extremities with ulceration ([place])   Foot ulcer, right ([place])   Assessment and Plan: * Lower limb ischemia right leg with history of angioplasty and stent on 05/14/2021- (present on admission) On admission, patient noted to have prolonged capillary refill, decreased pulses lower extremity lower extremity still warm and without cyanosis.  Started on heparin drip  Patient has been taking Plavix inconsistently since his procedure on 1/23.  Underwent angioplasty on 2/23 with catheter directed thrombolytic therapy.  Vascular surgery planning for repeat angiogram today.   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  Continue wound care at home.  Wound care  consulted  Atherosclerosis of native arteries of the extremities with ulceration ([place])- (present on admission) Continue aspirin and Lipitor  Diabetes mellitus type 2, insulin dependent (HCC) Sliding scale insulin coverage.Added long acting insulin  Coronary artery disease with history of myocardial infarction without history of CABG Continue aspirin, carvedilol, lisinopril and Lipitor  Hypertension Continue amlodipine, lisinopril and carvedilol.  Still running high.  Most likely contributed due to pain.  Continue pain medications for severe hypertension              DVT prophylaxis:Heparin IV     Code Status: Full Code  Family Communication: Wife at bedside on 2/23  Patient status:Inpatient  Patient is from :Home  Anticipated discharge NE:[id number]  Estimated DC date:1-2 days,needs vascular surgery clearance before discharge   Consultants: Vascular surgery  Procedures: None yet  Antimicrobials:  Anti-infectives (From admission, onward)    Start     Dose/Rate Route Frequency Ordered Stop   06/14/21 1555  ceFAZolin (ANCEF) IVPB 2g/100 mL premix  Status:  Discontinued        2 g 200 mL/hr over 30 Minutes Intravenous 30 min pre-op 06/14/21 1555 06/14/21 1635   06/14/21 1345  ceFAZolin (ANCEF) IVPB 2g/100 mL premix        2 g 200 mL/hr over 30 Minutes Intravenous On call 06/14/21 1335 06/14/21 1501       Subjective: Patient seen and examined at the bedside this morning in ICU.  Hemodynamically stable.  Pain on the right lower extremity is better.  He was eager to know about the next plan   Objective: Vitals:   06/15/21 0800 06/15/21 0900 06/15/21 1000 06/15/21 1100  BP: (!) 164/71 Marland Kitchen)  161/82 (!) 153/94 131/63  Pulse: 70 79 78 75  Resp: 16 15 19 15   Temp:  98.6 F (37 C)    TempSrc:  Oral    SpO2: 93% 97% 97% 93%  Weight:      Height:        Intake/Output Summary (Last 24 hours) at 06/15/2021 1139 Last data filed at 06/15/2021 1100 Gross per 24 hour   Intake 321.33 ml  Output 1975 ml  Net -1653.67 ml   Filed Weights   06/12/21 1954 06/13/21 1824  Weight: 84.8 kg 86.2 kg    Examination:  General exam: Overall comfortable, not in distress HEENT: PERRL Respiratory system:  no wheezes or crackles  Cardiovascular system: S1 & S2 heard, RRR.  Gastrointestinal system: Abdomen is nondistended, soft and nontender. Central nervous system: Alert and oriented Extremities: No edema, no clubbing ,no cyanosis Skin: Right foot ulcers covered with dressing  Data Reviewed: I have personally reviewed following labs and imaging studies  CBC: Recent Labs  Lab 06/12/21 1958 06/14/21 0542 06/15/21 0437  WBC 9.9 7.9 7.8  NEUTROABS 6.5 4.4  --   HGB 14.6 15.2 14.7  HCT 41.4 42.7 41.6  MCV 85.2 84.4 83.9  PLT 283 292 [id number]   Basic Metabolic Panel: Recent Labs  Lab 06/12/21 1958 06/14/21 0542  NA 131* 134*  K 4.0 3.8  CL 94* 100  CO2 27 26  GLUCOSE 291* 216*  BUN 17 10  CREATININE 0.87 0.66  CALCIUM 9.3 8.8*     Recent Results (from the past 240 hour(s))  Resp Panel by RT-PCR (Flu A&B, Covid) Nasopharyngeal Swab     Status: None   Collection Time: 06/12/21 11:57 PM   Specimen: Nasopharyngeal Swab; Nasopharyngeal(NP) swabs in vial transport medium  Result Value Ref Range Status   SARS Coronavirus 2 by RT PCR NEGATIVE NEGATIVE Final    Comment: (NOTE) SARS-CoV-2 target nucleic acids are NOT DETECTED.  The SARS-CoV-2 RNA is generally detectable in upper respiratory specimens during the acute phase of infection. The lowest concentration of SARS-CoV-2 viral copies this assay can detect is 138 copies/mL. A negative result does not preclude SARS-Cov-2 infection and should not be used as the sole basis for treatment or other patient management decisions. A negative result may occur with  improper specimen collection/handling, submission of specimen other than nasopharyngeal swab, presence of viral mutation(s) within the areas  targeted by this assay, and inadequate number of viral copies(<138 copies/mL). A negative result must be combined with clinical observations, patient history, and epidemiological information. The expected result is Negative.  Fact Sheet for Patients:  EntrepreneurPulse.com.au  Fact Sheet for Healthcare Providers:  IncredibleEmployment.be  This test is no t yet approved or cleared by the [place] FDA and  has been authorized for detection and/or diagnosis of SARS-CoV-2 by FDA under an Emergency Use Authorization (EUA). This EUA will remain  in effect (meaning this test can be used) for the duration of the COVID-19 declaration under Section 564(b)(1) of the Act, 21 U.S.C.section 360bbb-3(b)(1), unless the authorization is terminated  or revoked sooner.       Influenza A by PCR NEGATIVE NEGATIVE Final   Influenza B by PCR NEGATIVE NEGATIVE Final    Comment: (NOTE) The Xpert Xpress SARS-CoV-2/FLU/RSV plus assay is intended as an aid in the diagnosis of influenza from Nasopharyngeal swab specimens and should not be used as a sole basis for treatment. Nasal washings and aspirates are unacceptable for Xpert Xpress SARS-CoV-2/FLU/RSV testing.  Fact  Sheet for Patients: EntrepreneurPulse.com.au  Fact Sheet for Healthcare Providers: IncredibleEmployment.be  This test is not yet approved or cleared by the [place] FDA and has been authorized for detection and/or diagnosis of SARS-CoV-2 by FDA under an Emergency Use Authorization (EUA). This EUA will remain in effect (meaning this test can be used) for the duration of the COVID-19 declaration under Section 564(b)(1) of the Act, 21 U.S.C. section 360bbb-3(b)(1), unless the authorization is terminated or revoked.  Performed at [facility], [address] [place], [place] [address]   MRSA Next Gen by PCR, Nasal     Status: None   Collection  Time: 06/14/21  4:48 PM   Specimen: Nasal Mucosa; Nasal Swab  Result Value Ref Range Status   MRSA by PCR Next Gen NOT DETECTED NOT DETECTED Final    Comment: (NOTE) The GeneXpert MRSA Assay (FDA approved for NASAL specimens only), is one component of a comprehensive MRSA colonization surveillance program. It is not intended to diagnose MRSA infection nor to guide or monitor treatment for MRSA infections. Test performance is not FDA approved in patients less than 11 years old. Performed at [facility], [address] [place], [place] [address]      Radiology Studies: PERIPHERAL VASCULAR CATHETERIZATION  Result Date: 06/15/2021 See surgical note for result.   Scheduled Meds:  amLODipine  10 mg Oral Daily   atorvastatin  80 mg Oral Daily   carvedilol  12.5 mg Oral BID WC   Chlorhexidine Gluconate Cloth  6 each Topical Daily   clopidogrel  75 mg Oral Daily   insulin aspart  0-5 Units Subcutaneous QHS   insulin aspart  0-9 Units Subcutaneous TID WC   insulin glargine-yfgn  15 Units Subcutaneous Daily   lisinopril  20 mg Oral Daily   polymixin-bacitracin   Topical Daily   Continuous Infusions:  alteplase (LIMB ISCHEMIA) 10 mg in normal saline (0.02 mg/mL) infusion 0.5 mg/hr (06/15/21 0838)   heparin 600 Units/hr (06/15/21 1100)   promethazine (PHENERGAN) injection (IM or IVPB)       LOS: 2 days   Shelly Coss, MD [facility] P2/24/2023, 11:39 AM

## 2021-06-16 ENCOUNTER — Inpatient Hospital Stay: Payer: Medicare Other

## 2021-06-16 DIAGNOSIS — K5900 Constipation, unspecified: Secondary | ICD-10-CM

## 2021-06-16 DIAGNOSIS — I70221 Atherosclerosis of native arteries of extremities with rest pain, right leg: Secondary | ICD-10-CM

## 2021-06-16 DIAGNOSIS — I714 Abdominal aortic aneurysm, without rupture, unspecified: Secondary | ICD-10-CM

## 2021-06-16 DIAGNOSIS — M25552 Pain in left hip: Secondary | ICD-10-CM

## 2021-06-16 DIAGNOSIS — I700 Atherosclerosis of aorta: Secondary | ICD-10-CM

## 2021-06-16 DIAGNOSIS — R109 Unspecified abdominal pain: Secondary | ICD-10-CM

## 2021-06-16 DIAGNOSIS — Z95820 Peripheral vascular angioplasty status with implants and grafts: Secondary | ICD-10-CM

## 2021-06-16 LAB — CBC
HCT: 41.1 % (ref 39.0–52.0)
Hemoglobin: 14.6 g/dL (ref 13.0–17.0)
MCH: 30 pg (ref 26.0–34.0)
MCHC: 35.5 g/dL (ref 30.0–36.0)
MCV: 84.4 fL (ref 80.0–100.0)
Platelets: 260 10*3/uL (ref 150–400)
RBC: 4.87 MIL/uL (ref 4.22–5.81)
RDW: 12.1 % (ref 11.5–15.5)
WBC: 10.5 10*3/uL (ref 4.0–10.5)
nRBC: 0 % (ref 0.0–0.2)

## 2021-06-16 LAB — BASIC METABOLIC PANEL
Anion gap: 10 (ref 5–15)
BUN: 11 mg/dL (ref 8–23)
CO2: 25 mmol/L (ref 22–32)
Calcium: 8.8 mg/dL — ABNORMAL LOW (ref 8.9–10.3)
Chloride: 96 mmol/L — ABNORMAL LOW (ref 98–111)
Creatinine, Ser: 0.63 mg/dL (ref 0.61–1.24)
GFR, Estimated: >60 mL/min (ref >60)
Glucose, Bld: 227 mg/dL — ABNORMAL HIGH (ref 70–99)
Potassium: 3.7 mmol/L (ref 3.5–5.1)
Sodium: 131 mmol/L — ABNORMAL LOW (ref 135–145)

## 2021-06-16 LAB — GLUCOSE, CAPILLARY
Glucose-Capillary: 242 mg/dL — ABNORMAL HIGH (ref 70–99)
Glucose-Capillary: 181 mg/dL — ABNORMAL HIGH (ref 70–99)
Glucose-Capillary: 176 mg/dL — ABNORMAL HIGH (ref 70–99)
Glucose-Capillary: 172 mg/dL — ABNORMAL HIGH (ref 70–99)

## 2021-06-16 MED ORDER — SENNOSIDES-DOCUSATE SODIUM 8.6-50 MG PO TABS
1.0000 | ORAL_TABLET | Freq: Two times a day (BID) | ORAL | Status: DC
  Administered 2021-06-16 – 2021-06-17 (×3): 1 via ORAL
  Filled 2021-06-16 (×3): qty 1

## 2021-06-16 MED ORDER — IOHEXOL 300 MG/ML  SOLN
100.0000 mL | Freq: Once | INTRAMUSCULAR | Status: AC | PRN
  Administered 2021-06-16: 100 mL via INTRAVENOUS

## 2021-06-16 MED ORDER — POLYETHYLENE GLYCOL 3350 17 G PO PACK
17.0000 g | PACK | Freq: Every day | ORAL | Status: DC
  Administered 2021-06-16 – 2021-06-17 (×2): 17 g via ORAL
  Filled 2021-06-16 (×2): qty 1

## 2021-06-16 MED ORDER — ZOLPIDEM TARTRATE 5 MG PO TABS
5.0000 mg | ORAL_TABLET | Freq: Every day | ORAL | Status: DC
  Administered 2021-06-16: 5 mg via ORAL
  Filled 2021-06-16: qty 1

## 2021-06-16 MED ORDER — BISACODYL 10 MG RE SUPP
10.0000 mg | Freq: Once | RECTAL | Status: AC
  Administered 2021-06-16: 10 mg via RECTAL
  Filled 2021-06-16: qty 1

## 2021-06-16 MED ORDER — ZOLPIDEM TARTRATE 5 MG PO TABS: 10.0000 mg | ORAL_TABLET | Freq: Every day | ORAL | Status: DC

## 2021-06-16 NOTE — Progress Notes (Signed)
Chaplain On-Call responded to Spiritual Care Consult Order from RN Octavia Heir: "Advance Directives information".  Chaplain met patient and his wife at bedside.  Provided the Advance Directive documents and education to the patient. Encouraged reading and discussion with family about his health care decisions.  Patient stated his understanding.  Chaplain Pollyann Samples M.Div., [facility]

## 2021-06-16 NOTE — Progress Notes (Addendum)
Subjective  - POD #1, s/p mechanical thrombectomy, stenting and PTA of the right leg for rest pain  C/O left hip pain, and no having a bowel movement since Monday   Physical Exam:  Brisk right PT signal Left groin soft with no evidence of hematoma Knot like area above left iliac crest whichi s tender to the touch Abd is soft       Assessment/Plan:  POD #1  Transition to ASA/Plavix Good perfusion of right leg after yesterdays intervention Unclear what area of tenderness in left hip is.  I do not think this is a hematoma from cath yesterday as the groin is soft and HB unchanged.  Consider CT to better evaluate this area.  Wells Jaselyn Nahm 06/16/2021 4:50 PM  ADDENDUM:  I reviewed his CT scan with the following results: 1. Extensive atherosclerotic calcification of the abdominal aorta. 2. 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 Recommend evaluation by vascular surgery. 3. RIGHT femoral artery stent, incompletely imaged. 4. Incidentally noted 5 millimeter calculus in the distal RIGHT ureter, in the region of the ureterovesical junction. There is no associated hydronephrosis. Chronicity of this finding is not known. 5. Chronic superior endplate fracture of L1. 6. Small fat containing paraumbilical hernia. 7. Significant stool burden. 8. Colonic diverticulosis.  Aortic aneurysm is not responsible for patient's sx's.  No treatment is required for this currently  WB --  Vitals:   06/16/21 1402 06/16/21 1500  BP: (!) 145/88   Pulse: 73 87  Resp: 17 16  Temp:    SpO2: 98% 97%    Intake/Output Summary (Last 24 hours) at 06/16/2021 1650 Last data filed at 06/16/2021 1500 Gross per 24 hour  Intake 1449.86 ml  Output 1525 ml  Net -75.14 ml     Laboratory CBC    Component Value Date/Time   WBC 10.5 06/16/2021 0410   HGB 14.6 06/16/2021 0410   HGB 15.7 02/16/2012 2013   HCT 41.1 06/16/2021 0410   HCT 44.4 02/16/2012 2013    PLT 260 06/16/2021 0410   PLT 176 02/16/2012 2013    BMET    Component Value Date/Time   NA 131 (L) 06/16/2021 0410   NA 137 02/16/2012 2013   K 3.7 06/16/2021 0410   K 3.8 02/16/2012 2013   CL 96 (L) 06/16/2021 0410   CL 102 02/16/2012 2013   CO2 25 06/16/2021 0410   CO2 25 02/16/2012 2013   GLUCOSE 227 (H) 06/16/2021 0410   GLUCOSE 154 (H) 02/16/2012 2013   BUN 11 06/16/2021 0410   BUN 10 02/16/2012 2013   CREATININE 0.63 06/16/2021 0410   CREATININE 0.72 02/16/2012 2013   CALCIUM 8.8 (L) 06/16/2021 0410   CALCIUM 9.6 02/16/2012 2013   GFRNONAA >60 06/16/2021 0410   GFRNONAA >60 02/16/2012 2013   GFRAA >60 12/09/2018 0929   GFRAA >60 02/16/2012 2013    COAG Lab Results  Component Value Date   INR 1.0 06/12/2021   INR 1.0 04/09/2021   No results found for: PTT  Antibiotics Anti-infectives (From admission, onward)    Start     Dose/Rate Route Frequency Ordered Stop   06/15/21 1545  ceFAZolin (ANCEF) IVPB 2g/100 mL premix        2 g 200 mL/hr over 30 Minutes Intravenous  Once 06/15/21 1544 06/16/21 0142   06/14/21 1555  ceFAZolin (ANCEF) IVPB 2g/100 mL premix  Status:  Discontinued        2 g 200  mL/hr over 30 Minutes Intravenous 30 min pre-op 06/14/21 1555 06/14/21 1635   06/14/21 1345  ceFAZolin (ANCEF) IVPB 2g/100 mL premix        2 g 200 mL/hr over 30 Minutes Intravenous On call 06/14/21 1335 06/14/21 1501        V. Leia Alf, M.D., [facility] [place] Office: [phone] Pager:  [phone]

## 2021-06-16 NOTE — Progress Notes (Signed)
PROGRESS NOTE  Manuel [name]  OIN:[id number] DOB: 10/15/1945 DOA: 06/12/2021 PCP: Gracelyn Nurse, MD   Brief Narrative: Patient is a 76 year old male with medical history significant of insulin-dependent DM, HTN, CAD, PAD s/p angioplasty with stent 05/14/2021 who presents to the ED with right leg pain with ambulation and at rest that has been ongoing since his surgery and getting progressively worse.  He has been using pain medication without significant relief and now he has a pain throughout the night.  He admits to not using his Plavix regularly since the procedure.  He has a history of noncompliance. Right lower extremity ischemia was suspected on admission.  Started on heparin drip.  Vascular surgery consulted.  Underwent angioplasty with mechanical thrombectomy, stent placement by vascular surgery.  Hospital course remarkable for new onset severe left flank pain.  Assessment & Plan:  Principal Problem:   Lower limb ischemia right leg with history of angioplasty and stent on 05/14/2021 Active Problems:   Hypertension   Coronary artery disease with history of myocardial infarction without history of CABG   Diabetes mellitus type 2, insulin dependent (HCC)   S/P angioplasty with stent   Atherosclerosis of native arteries of the extremities with ulceration (HCC)   Foot ulcer, right (HCC)   Left flank pain   Constipation   Assessment and Plan: * Lower limb ischemia right leg with history of angioplasty and stent on 05/14/2021- (present on admission) On admission, patient noted to have prolonged capillary refill, decreased pulses lower extremity lower extremity still warm and without cyanosis.  Started on heparin drip  Patient has been taking Plavix inconsistently since his procedure on 1/23.  Vascular surgery did angioplasty with thrombectomy, stent placement.  Started on Eliquis, aspirin will be continued on discharge   Constipation Continue bowel regimen  Left flank pain New  problem.  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  Continue wound care at home.  Wound care consulted  Atherosclerosis of native arteries of the extremities with ulceration (HCC)- (present on admission) Continue aspirin and Lipitor  Diabetes mellitus type 2, insulin dependent (HCC) Sliding scale insulin coverage.Added long acting insulin  Coronary artery disease with history of myocardial infarction without history of CABG Continue aspirin, carvedilol, lisinopril and Lipitor  Hypertension Continue amlodipine, lisinopril and carvedilol.  Still running high.  Most likely contributed due to pain.  Continue pain medications for severe hypertension              DVT prophylaxis:Heparin IV apixaban (ELIQUIS) tablet 5 mg     Code Status: Full Code  Family Communication: Wife at bedside on 2/25  Patient status:Inpatient  Patient is from :Home  Anticipated discharge BS:JGGE  Estimated DC date:tomorrow   Consultants: Vascular surgery  Procedures: None yet  Antimicrobials:  Anti-infectives (From admission, onward)    Start     Dose/Rate Route Frequency Ordered Stop   06/15/21 1545  ceFAZolin (ANCEF) IVPB 2g/100 mL premix        2 g 200 mL/hr over 30 Minutes Intravenous  Once 06/15/21 1544 06/16/21 0142   06/14/21 1555  ceFAZolin (ANCEF) IVPB 2g/100 mL premix  Status:  Discontinued        2 g 200 mL/hr over 30 Minutes Intravenous 30 min pre-op 06/14/21 1555 06/14/21 1635   06/14/21 1345  ceFAZolin (ANCEF) IVPB 2g/100 mL premix        2 g 200 mL/hr over 30 Minutes Intravenous On call 06/14/21 1335 06/14/21  1501       Subjective: Patient seen and examined at the bedside this morning.  Hemodynamically stable.  Complains of lack of sleep.  Complains of severe pain in the left flank that started last night.  No bowel movement since last several days   Objective: Vitals:   06/16/21 0900 06/16/21 0905 06/16/21 1000  06/16/21 1100  BP: (!) 162/78   (!) 160/80  Pulse: 87 86 84 88  Resp: 18 17 (!) 23 18  Temp: 98.2 F (36.8 C)     TempSrc: Oral     SpO2: 100% 98% 99% 96%  Weight:      Height:        Intake/Output Summary (Last 24 hours) at 06/16/2021 1256 Last data filed at 06/16/2021 0920 Gross per 24 hour  Intake 1374.58 ml  Output 1525 ml  Net -150.42 ml   Filed Weights   06/12/21 1954 06/13/21 1824 06/16/21 0500  Weight: 84.8 kg 86.2 kg 84 kg    Examination:  General exam: Overall comfortable, not in distress HEENT: PERRL Respiratory system:  no wheezes or crackles  Cardiovascular system: S1 & S2 heard, RRR.  Gastrointestinal system: Abdomen is nondistended, soft and nontender. Central nervous system: Alert and oriented Extremities: No edema, no clubbing ,no cyanosis Skin: No rashes, ,no icterus ,right foot ulcer   Data Reviewed: I have personally reviewed following labs and imaging studies  CBC: Recent Labs  Lab 06/12/21 1958 06/14/21 0542 06/15/21 0437 06/16/21 0410  WBC 9.9 7.9 7.8 10.5  NEUTROABS 6.5 4.4  --   --   HGB 14.6 15.2 14.7 14.6  HCT 41.4 42.7 41.6 41.1  MCV 85.2 84.4 83.9 84.4  PLT 283 292 236 260   Basic Metabolic Panel: Recent Labs  Lab 06/12/21 1958 06/14/21 0542 06/16/21 0410  NA 131* 134* 131*  K 4.0 3.8 3.7  CL 94* 100 96*  CO2 27 26 25   GLUCOSE 291* 216* 227*  BUN 17 10 11   CREATININE 0.87 0.66 0.63  CALCIUM 9.3 8.8* 8.8*     Recent Results (from the past 240 hour(s))  Resp Panel by RT-PCR (Flu A&B, Covid) Nasopharyngeal Swab     Status: None   Collection Time: 06/12/21 11:57 PM   Specimen: Nasopharyngeal Swab; Nasopharyngeal(NP) swabs in vial transport medium  Result Value Ref Range Status   SARS Coronavirus 2 by RT PCR NEGATIVE NEGATIVE Final    Comment: (NOTE) SARS-CoV-2 target nucleic acids are NOT DETECTED.  The SARS-CoV-2 RNA is generally detectable in upper respiratory specimens during the acute phase of infection. The  lowest concentration of SARS-CoV-2 viral copies this assay can detect is 138 copies/mL. A negative result does not preclude SARS-Cov-2 infection and should not be used as the sole basis for treatment or other patient management decisions. A negative result may occur with  improper specimen collection/handling, submission of specimen other than nasopharyngeal swab, presence of viral mutation(s) within the areas targeted by this assay, and inadequate number of viral copies(<138 copies/mL). A negative result must be combined with clinical observations, patient history, and epidemiological information. The expected result is Negative.  Fact Sheet for Patients:  BloggerCourse.com  Fact Sheet for Healthcare Providers:  SeriousBroker.it  This test is no t yet approved or cleared by the [place] FDA and  has been authorized for detection and/or diagnosis of SARS-CoV-2 by FDA under an Emergency Use Authorization (EUA). This EUA will remain  in effect (meaning this test can be used) for the duration of  the COVID-19 declaration under Section 564(b)(1) of the Act, 21 U.S.C.section 360bbb-3(b)(1), unless the authorization is terminated  or revoked sooner.       Influenza A by PCR NEGATIVE NEGATIVE Final   Influenza B by PCR NEGATIVE NEGATIVE Final    Comment: (NOTE) The Xpert Xpress SARS-CoV-2/FLU/RSV plus assay is intended as an aid in the diagnosis of influenza from Nasopharyngeal swab specimens and should not be used as a sole basis for treatment. Nasal washings and aspirates are unacceptable for Xpert Xpress SARS-CoV-2/FLU/RSV testing.  Fact Sheet for Patients: BloggerCourse.com  Fact Sheet for Healthcare Providers: SeriousBroker.it  This test is not yet approved or cleared by the [place] FDA and has been authorized for detection and/or diagnosis of SARS-CoV-2 by FDA under  an Emergency Use Authorization (EUA). This EUA will remain in effect (meaning this test can be used) for the duration of the COVID-19 declaration under Section 564(b)(1) of the Act, 21 U.S.C. section 360bbb-3(b)(1), unless the authorization is terminated or revoked.  Performed at [facility], [address] [place], [place] [address]   MRSA Next Gen by PCR, Nasal     Status: None   Collection Time: 06/14/21  4:48 PM   Specimen: Nasal Mucosa; Nasal Swab  Result Value Ref Range Status   MRSA by PCR Next Gen NOT DETECTED NOT DETECTED Final    Comment: (NOTE) The GeneXpert MRSA Assay (FDA approved for NASAL specimens only), is one component of a comprehensive MRSA colonization surveillance program. It is not intended to diagnose MRSA infection nor to guide or monitor treatment for MRSA infections. Test performance is not FDA approved in patients less than 42 years old. Performed at [facility], [address] [place], [place] [address]      Radiology Studies: PERIPHERAL VASCULAR CATHETERIZATION  Result Date: 06/15/2021 See surgical note for result.  PERIPHERAL VASCULAR CATHETERIZATION  Result Date: 06/15/2021 See surgical note for result.   Scheduled Meds:  amLODipine  10 mg Oral Daily   apixaban  5 mg Oral BID   atorvastatin  80 mg Oral Daily   carvedilol  12.5 mg Oral BID WC   Chlorhexidine Gluconate Cloth  6 each Topical Daily   insulin aspart  0-5 Units Subcutaneous QHS   insulin aspart  0-9 Units Subcutaneous TID WC   insulin glargine-yfgn  15 Units Subcutaneous Daily   lisinopril  20 mg Oral Daily   pneumococcal 23 valent vaccine  0.5 mL Intramuscular Tomorrow-1000   polyethylene glycol  17 g Oral Daily   polymixin-bacitracin   Topical Daily   senna-docusate  1 tablet Oral BID   Continuous Infusions:  promethazine (PHENERGAN) injection (IM or IVPB)       LOS: 3 days   Burnadette Pop, MD [facility] P2/25/2023, 12:56 PM

## 2021-06-16 NOTE — Assessment & Plan Note (Signed)
Continue bowel regimen. ?

## 2021-06-16 NOTE — Assessment & Plan Note (Signed)
New problem.  Ordering CT abdomen/pelvis

## 2021-06-17 ENCOUNTER — Encounter (INDEPENDENT_AMBULATORY_CARE_PROVIDER_SITE_OTHER): Payer: Self-pay | Admitting: Nurse Practitioner

## 2021-06-17 LAB — GLUCOSE, CAPILLARY: Glucose-Capillary: 190 mg/dL — ABNORMAL HIGH (ref 70–99)

## 2021-06-17 MED ORDER — SENNOSIDES-DOCUSATE SODIUM 8.6-50 MG PO TABS: 1.0000 | ORAL_TABLET | Freq: Every evening | ORAL | 0 refills | Status: DC | PRN

## 2021-06-17 MED ORDER — LISINOPRIL 20 MG PO TABS: 20.0000 mg | ORAL_TABLET | Freq: Every day | ORAL | 1 refills | Status: DC

## 2021-06-17 MED ORDER — ATORVASTATIN CALCIUM 80 MG PO TABS: 80.0000 mg | ORAL_TABLET | Freq: Every day | ORAL | 1 refills | Status: DC

## 2021-06-17 MED ORDER — ZOLPIDEM TARTRATE 5 MG PO TABS: 5.0000 mg | ORAL_TABLET | Freq: Every evening | ORAL | 0 refills | Status: DC | PRN

## 2021-06-17 MED ORDER — APIXABAN 5 MG PO TABS: 5.0000 mg | ORAL_TABLET | Freq: Two times a day (BID) | ORAL | 1 refills | Status: DC

## 2021-06-17 MED ORDER — POLYETHYLENE GLYCOL 3350 17 G PO PACK: 17.0000 g | PACK | Freq: Every day | ORAL | 0 refills | Status: DC

## 2021-06-17 NOTE — Progress Notes (Signed)
Subjective:    Patient ID: LYNKIN TORRENTE, male    DOB: Nov 13, 1945, 76 y.o.   MRN: [id number] Chief Complaint  Patient presents with   Follow-up    Follow up ABI see fb in 3 weeks     Avriel Bolivar is a 76 year old male that presents today following angiogram on his right lower extremity due to ulceration.  He underwent intervention including:  Procedure(s) Performed:             1.  Ultrasound guidance for vascular access left femoral artery             2.  Catheter placement into right common femoral artery from left femoral approach             3.  Aortogram and selective right lower extremity angiogram             4.  Percutaneous transluminal angioplasty of left common iliac artery with 6 mm diameter by 4 cm length Lutonix drug-coated angioplasty balloon             5.  Percutaneous transluminal angioplasty of the right peroneal artery and tibioperoneal trunk with 2.5 mm diameter by 22 cm length angioplasty balloon             6.  Percutaneous transluminal angioplasty of the right distal SFA and above-knee popliteal artery with 6 mm diameter Lutonix drug-coated angioplasty balloon             7.  Stent placement to the right distal SFA and proximal popliteal artery with a 7 mm diameter by 12 cm length life stent             8.  Percutaneous transluminal angioplasty of the right posterior tibial artery with 2.5 mm diameter by 22 cm length angioplasty balloon             9.  StarClose closure device left femoral artery  The patient notes initially having some pain in his right lower extremity following intervention but that has essentially subsided now.  Now he still has some pains in his foot where the wound is.  He notes that with debridement the wound does have some bleeding whereas it did not have any previously.  He notes that there has been some slight improvement in the wound.   Review of Systems  Skin:  Positive for wound.  All other systems reviewed and are negative.      Objective:   Physical Exam Vitals reviewed.  HENT:     Head: Normocephalic.  Cardiovascular:     Rate and Rhythm: Normal rate.  Pulmonary:     Effort: Pulmonary effort is normal.  Skin:    General: Skin is warm and dry.  Neurological:     Mental Status: He is alert and oriented to person, place, and time.  Psychiatric:        Mood and Affect: Mood normal.        Behavior: Behavior normal.        Thought Content: Thought content normal.        Judgment: Judgment normal.    BP 136/80    Pulse 73    Ht 5\' 8"  (1.727 m)    Wt 186 lb (84.4 kg)    BMI 28.28 kg/m   Past Medical History:  Diagnosis Date   Allergies    Arthritis    Diabetes mellitus without complication (HCC)    Hard of hearing  Hyperlipidemia    Hypertension    Myocardial infarction [facility]) 2008   treated here at [facility] and transferred to [place] for stent placement   Neuropathy    knees down   Peripheral vascular disease ([place])    Sleep apnea    "in the past"   Status post primary angioplasty with coronary stent    Wears dentures    full upper and lower    Social History   Socioeconomic History   Marital status: Married    Spouse name: Not on file   Number of children: Not on file   Years of education: Not on file   Highest education level: Not on file  Occupational History   Not on file  Tobacco Use   Smoking status: Never   Smokeless tobacco: Never  Vaping Use   Vaping Use: Never used  Substance and Sexual Activity   Alcohol use: Yes    Alcohol/week: 21.0 standard drinks    Types: 7 Glasses of wine, 14 Cans of beer per week    Comment: occassional   Drug use: No   Sexual activity: Not on file  Other Topics Concern   Not on file  Social History Narrative   Not on file   Social Determinants of Health   Financial Resource Strain: Not on file  Food Insecurity: Not on file  Transportation Needs: Not on file  Physical Activity: Not on file  Stress: Not on file  Social Connections: Not on  file  Intimate Partner Violence: Not on file    Past Surgical History:  Procedure Laterality Date   CATARACT EXTRACTION W/PHACO Right 03/14/2021   Procedure: CATARACT EXTRACTION PHACO AND INTRAOCULAR LENS PLACEMENT ([place]) RIGHT DIABETIC;  Surgeon: Leandrew Koyanagi, MD;  Location: [facility];  Service: Ophthalmology;  Laterality: Right;  Diabetic 16.78 01:39.9   CATARACT EXTRACTION W/PHACO Left 03/28/2021   Procedure: CATARACT EXTRACTION PHACO AND INTRAOCULAR LENS PLACEMENT ([facility]) LEFT DIABETIC 6.93 01:22.0;  Surgeon: Leandrew Koyanagi, MD;  Location: [facility];  Service: Ophthalmology;  Laterality: Left;  Diabetic   LOWER EXTREMITY ANGIOGRAPHY Right 05/14/2021   Procedure: LOWER EXTREMITY ANGIOGRAPHY;  Surgeon: Algernon Huxley, MD;  Location: [facility];  Service: Cardiovascular;  Laterality: Right;   LOWER EXTREMITY ANGIOGRAPHY Right 06/14/2021   Procedure: Lower Extremity Angiography;  Surgeon: Algernon Huxley, MD;  Location: [facility];  Service: Cardiovascular;  Laterality: Right;   TONSILLECTOMY     had removed as a child    Family History  Problem Relation Age of Onset   Scoliosis Mother    Heart disease Father     No Known Allergies  CBC Latest Ref Rng & Units 06/16/2021 06/15/2021 06/14/2021  WBC 4.0 - 10.5 K/uL 10.5 7.8 7.9  Hemoglobin 13.0 - 17.0 g/dL 14.6 14.7 15.2  Hematocrit 39.0 - 52.0 % 41.1 41.6 42.7  Platelets 150 - 400 K/uL 260 236 292      CMP     Component Value Date/Time   NA 131 (L) 06/16/2021 0410   NA 137 02/16/2012 2013   K 3.7 06/16/2021 0410   K 3.8 02/16/2012 2013   CL 96 (L) 06/16/2021 0410   CL 102 02/16/2012 2013   CO2 25 06/16/2021 0410   CO2 25 02/16/2012 2013   GLUCOSE 227 (H) 06/16/2021 0410   GLUCOSE 154 (H) 02/16/2012 2013   BUN 11 06/16/2021 0410   BUN 10 02/16/2012 2013   CREATININE 0.63 06/16/2021 0410   CREATININE  0.72 02/16/2012 2013   CALCIUM 8.8 (L) 06/16/2021 0410   CALCIUM 9.6  02/16/2012 2013   PROT 7.1 06/12/2021 1958   PROT 7.5 02/16/2012 2013   ALBUMIN 3.5 06/12/2021 1958   ALBUMIN 4.4 02/16/2012 2013   AST 15 06/12/2021 1958   AST 52 (H) 02/16/2012 2013   ALT 15 06/12/2021 1958   ALT 101 (H) 02/16/2012 2013   ALKPHOS 77 06/12/2021 1958   ALKPHOS 77 02/16/2012 2013   BILITOT 1.1 06/12/2021 1958   BILITOT 0.6 02/16/2012 2013   GFRNONAA >60 06/16/2021 0410   GFRNONAA >60 02/16/2012 2013   GFRAA >60 12/09/2018 0929   GFRAA >60 02/16/2012 2013     VAS [place] ABI WITH/WO TBI  Result Date: 05/16/2021  LOWER EXTREMITY DOPPLER STUDY Patient Name:  TYKWON CHRISLER  Date of Exam:   05/08/2021 Medical Rec #: [id number]         Accession #:    [id number] Date of Birth: [birth year]         Patient Gender: M Patient Age:   82 years Exam Location:  [facility] Procedure:      VAS [place] ABI WITH/WO TBI Referring Phys: --------------------------------------------------------------------------------  Indications: Peripheral artery disease.  Performing Technologist: Concha Norway RVT  Examination Guidelines: A complete evaluation includes at minimum, Doppler waveform signals and systolic blood pressure reading at the level of bilateral brachial, anterior tibial, and posterior tibial arteries, when vessel segments are accessible. Bilateral testing is considered an integral part of a complete examination. Photoelectric Plethysmograph (PPG) waveforms and toe systolic pressure readings are included as required and additional duplex testing as needed. Limited examinations for reoccurring indications may be performed as noted.  ABI Findings: +---------+------------------+-----+----------+--------+  Right     Rt Pressure (mmHg) Index Waveform   Comment   +---------+------------------+-----+----------+--------+  Brachial  127                                           +---------+------------------+-----+----------+--------+  ATA       90                 0.71  monophasic            +---------+------------------+-----+----------+--------+  PTA       137                1.08  monophasic           +---------+------------------+-----+----------+--------+  Great Toe 56                 0.44  Abnormal             +---------+------------------+-----+----------+--------+ +---------+------------------+-----+----------+-------+  Left      Lt Pressure (mmHg) Index Waveform   Comment  +---------+------------------+-----+----------+-------+  Brachial  125                                          +---------+------------------+-----+----------+-------+  ATA       142                1.12  biphasic            +---------+------------------+-----+----------+-------+  PTA       152  1.20  monophasic          +---------+------------------+-----+----------+-------+  Great Toe 114                0.90  Abnormal            +---------+------------------+-----+----------+-------+  Summary: Right: Resting right ankle-brachial index is within normal range. No evidence of significant right lower extremity arterial disease. The right toe-brachial index is abnormal. Left: Resting left ankle-brachial index is within normal range. No evidence of significant left lower extremity arterial disease. The left toe-brachial index is normal.  *See table(s) above for measurements and observations.  Electronically signed by Leotis Pain MD on 05/16/2021 at 10:46:53 AM.    Final        Assessment & Plan:   1. Atherosclerosis of native arteries of the extremities with ulceration ([place]) Recommend:  The patient is status post successful angiogram with intervention.  The patient reports that the claudication symptoms and leg pain is essentially gone.   The patient denies lifestyle limiting changes at this point in time.  No further invasive studies, angiography or surgery at this time The patient should continue walking and begin a more formal exercise program.  The patient should continue antiplatelet therapy and  aggressive treatment of the lipid abnormalities  The patient should continue wearing graduated compression socks 10-15 mmHg strength to control the mild edema.  Patient should undergo noninvasive studies as ordered. The patient will follow up with me after the studies.    We will continue with close follow-up as the patient still has a healing wound.  Patient advised to contact us sooner if the wound recurrence or if wound healing becomes delayed.  2. Diabetes mellitus type 2, insulin dependent ([place]) Continue hypoglycemic medications as already ordered, these medications have been reviewed and there are no changes at this time.  Hgb A1C to be monitored as already arranged by primary service   3. Mixed hyperlipidemia Continue statin as ordered and reviewed, no changes at this time   4. Benign essential hypertension Continue antihypertensive medications as already ordered, these medications have been reviewed and there are no changes at this time.    Current Outpatient Medications on File Prior to Visit  Medication Sig Dispense Refill   amLODipine (NORVASC) 5 MG tablet Take 10 mg by mouth daily.      aspirin EC 81 MG tablet Take 1 tablet (81 mg total) by mouth daily. 150 tablet 2   carvedilol (COREG) 12.5 MG tablet Take 1 tablet by mouth 2 (two) times daily.     DULoxetine (CYMBALTA) 30 MG capsule Take 30 mg by mouth daily.     fluticasone (FLONASE) 50 MCG/ACT nasal spray Place 2 sprays into both nostrils as needed for allergies or rhinitis.     gabapentin (NEURONTIN) 100 MG capsule Take 100 mg by mouth at bedtime.     Ibuprofen 200 MG CAPS Take 2 tablets by mouth 3 times/day as needed-between meals & bedtime.     insulin aspart protamine- aspart (NOVOLOG MIX 70/30) (70-30) 100 UNIT/ML injection Inject 32 Units into the skin 2 (two) times daily with a meal.     No current facility-administered medications on file prior to visit.    There are no Patient Instructions on file for this  visit. No follow-ups on file.   Kris Hartmann, NP

## 2021-06-17 NOTE — Discharge Summary (Signed)
Physician Discharge Summary  Manuel [name]:[id number] DOB: Apr 18, 1946 DOA: 06/12/2021  PCP: Gracelyn Nurse, MD  Admit date: 06/12/2021 Discharge date: 06/17/2021  Admitted From: Home Disposition:  Home  Discharge Condition:Stable CODE STATUS:FULL Diet recommendation: Heart Healthy   Brief/Interim Summary:  Patient is a 76 year old male with medical history significant of insulin-dependent DM, HTN, CAD, PAD s/p angioplasty with stent 05/14/2021 who presents to the ED with right leg pain with ambulation and at rest that has been ongoing since his surgery and getting progressively worse.  He has been using pain medication without significant relief and now he has a pain throughout the night.  He admits to not using his Plavix regularly since the procedure.  He has a history of noncompliance. Right lower extremity ischemia was suspected on admission.  Started on heparin drip.  Vascular surgery consulted.  Underwent angioplasty with mechanical thrombectomy, stent placement by vascular surgery.  Vascular surgery has cleared for discharge.  He is medically stable for discharge to home today.  Following problems were addressed during his hospitalization:   Lower limb ischemia right leg with history of angioplasty and stent on 05/14/2021- (present on admission) On admission, patient noted to have prolonged capillary refill, decreased pulses lower extremity lower extremity still warm and without cyanosis.  Started on heparin drip  Patient has been taking Plavix inconsistently since his procedure on 1/23.  Vascular surgery did angioplasty with thrombectomy, stent placement.  Started on Eliquis, aspirin will be continued on discharge.Plavix discontinued   Constipation Continue bowel regimen   Left flank pain New problem.  CT abdomen/pelvis did not show any acute intra abdominal or pelvic pathology that needs to be addressed emergently.  Pain has resolved.   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  Continue wound care at home.  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 history of CABG   Diabetes mellitus type 2, insulin dependent (HCC)   S/P angioplasty with stent   Atherosclerosis of native arteries of the extremities with ulceration (HCC)   Foot ulcer, right (HCC)   Left flank pain   Constipation    Discharge Instructions  Discharge Instructions     Diet - low sodium heart healthy   Complete by: As directed    Discharge instructions   Complete by: As directed    1)Please take prescribed medications as instructed 2)Follow up with vascular surgery as an outpatient   Discharge wound care:   Complete by: As directed    As per wound care   Increase activity slowly   Complete by: As directed       Allergies as of 06/17/2021   No Known Allergies      Medication List     STOP taking these medications    clopidogrel 75 MG tablet Commonly known as: Plavix   doxycycline 100 MG tablet Commonly known as: VIBRA-TABS  metFORMIN 500 MG tablet Commonly known as: GLUCOPHAGE   pravastatin 40 MG tablet Commonly known as: PRAVACHOL       TAKE these medications    amLODipine 5 MG tablet Commonly known as: NORVASC Take 10 mg by mouth daily.   apixaban 5 MG Tabs tablet Commonly known as: ELIQUIS Take  1 tablet (5 mg total) by mouth 2 (two) times daily.   aspirin EC 81 MG tablet Take 1 tablet (81 mg total) by mouth daily.   atorvastatin 80 MG tablet Commonly known as: LIPITOR Take 1 tablet (80 mg total) by mouth daily. Start taking on: June 18, 2021   carvedilol 12.5 MG tablet Commonly known as: COREG Take 1 tablet by mouth 2 (two) times daily.   DULoxetine 30 MG capsule Commonly known as: CYMBALTA Take 30 mg by mouth daily.   fluticasone 50 MCG/ACT nasal spray Commonly known as: FLONASE Place 2 sprays into both nostrils as needed for allergies or rhinitis.   gabapentin 100 MG capsule Commonly known as: NEURONTIN Take 100 mg by mouth at bedtime.   HYDROcodone-acetaminophen 5-325 MG tablet Commonly known as: NORCO/VICODIN Take 1 tablet by mouth every 4 (four) hours as needed for moderate pain.   Ibuprofen 200 MG Caps Take 2 tablets by mouth 3 times/day as needed-between meals & bedtime.   insulin aspart protamine- aspart (70-30) 100 UNIT/ML injection Commonly known as: NOVOLOG MIX 70/30 Inject 32 Units into the skin 2 (two) times daily with a meal.   lisinopril 20 MG tablet Commonly known as: ZESTRIL Take 1 tablet (20 mg total) by mouth daily.   polyethylene glycol 17 g packet Commonly known as: MIRALAX / GLYCOLAX Take 17 g by mouth daily. Start taking on: June 18, 2021   senna-docusate 8.6-50 MG tablet Commonly known as: Senokot-S Take 1 tablet by mouth at bedtime as needed for mild constipation.   zolpidem 5 MG tablet Commonly known as: AMBIEN Take 1 tablet (5 mg total) by mouth at bedtime as needed for up to 5 doses for sleep.               Discharge Care Instructions  (From admission, onward)           Start     Ordered   06/17/21 0000  Discharge wound care:       Comments: As per wound care   06/17/21 1019            Follow-up Information     Gracelyn NurseJohnston, John D, MD. Schedule an appointment as soon as possible for a visit in  1 week(s).   Specialty: Internal Medicine Contact information: [address] [place] [address] [phone]                No Known Allergies  Consultations: Vascular surgery   Procedures/Studies: CT ABDOMEN PELVIS W CONTRAST  Result Date: 06/16/2021 CLINICAL DATA:  Abdominal pain. History of diabetes. Patient had angioplasty on 02/23 with catheter directed thrombolytic therapy. Sudden onset of LEFT flank pain. EXAM: CT ABDOMEN AND PELVIS WITH CONTRAST TECHNIQUE: Multidetector CT imaging of the abdomen and pelvis was performed using the standard protocol following bolus administration of intravenous contrast. RADIATION DOSE REDUCTION: This exam was performed according to the departmental dose-optimization program which includes automated exposure control, adjustment of the mA and/or kV according to patient size and/or use of iterative reconstruction technique. CONTRAST:  100mL OMNIPAQUE IOHEXOL 300 MG/ML  SOLN COMPARISON:  Lumbar spine MRI on 10/12/2019 FINDINGS: Lower chest: Lung  bases are unremarkable. Heart size is normal. There is significant coronary artery calcification. No pericardial effusions. Hepatobiliary: No focal liver abnormality is seen. No radiopaque gallstones, biliary dilatation, or pericholecystic inflammatory changes. Pancreas: Unremarkable. No pancreatic ductal dilatation or surrounding inflammatory changes. Spleen: Normal in size without focal abnormality. Adrenals/Urinary Tract: Adrenal glands are normal in appearance. RIGHT kidney is unremarkable. The distal RIGHT ureter contains a 5 millimeter calculus, image 80 of series 2 at the ureterovesical junction. There is no associated hydroureter or hydronephrosis. Nonobstructing 3 millimeter calculus in the midpole region of the LEFT kidney. The LEFT ureter is unremarkable. The bladder and visualized portion of the urethra are normal. Stomach/Bowel: The stomach and small bowel loops are normal in appearance.  There is extensive colonic diverticulosis, not associated with inflammatory changes. The appendix is well seen and normal in appearance. Significant stool burden. Vascular/Lymphatic: There is extensive atherosclerotic calcific calcification of the abdominal aorta. 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 (Image 58 series 2). Patient has a RIGHT femoral stent, only partially imaged. Although not performed with early phase contrast, the study shows no evidence for thrombosed LOWER aorta or proximal femoral arteries. Reproductive: Prostate is enlarged and mildly heterogeneous. Other: Small fat containing paraumbilical hernia.  No ascites. Musculoskeletal: Chronic superior endplate fracture of L1. Moderate degenerative changes throughout the lumbar spine. No acute abnormality. IMPRESSION: 1. Extensive atherosclerotic calcification of the abdominal aorta. 2. 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 Recommend evaluation by vascular surgery. 3. RIGHT femoral artery stent, incompletely imaged. 4. Incidentally noted 5 millimeter calculus in the distal RIGHT ureter, in the region of the ureterovesical junction. There is no associated hydronephrosis. Chronicity of this finding is not known. 5. Chronic superior endplate fracture of L1. 6. Small fat containing paraumbilical hernia. 7. Significant stool burden. 8. Colonic diverticulosis. Electronically Signed   By: Norva Pavlov M.D.   On: 06/16/2021 16:24   PERIPHERAL VASCULAR CATHETERIZATION  Result Date: 06/15/2021 See surgical note for result.  PERIPHERAL VASCULAR CATHETERIZATION  Result Date: 06/15/2021 See surgical note for result.  US Venous Img Lower Unilateral Right  Result Date: 06/12/2021 CLINICAL DATA:  Recent right lower extremity angiography with pain, initial encounter EXAM: RIGHT LOWER EXTREMITY VENOUS DOPPLER ULTRASOUND TECHNIQUE: Gray-scale sonography with  graded compression, as well as color Doppler and duplex ultrasound were performed to evaluate the lower extremity deep venous systems from the level of the common femoral vein and including the common femoral, femoral, profunda femoral, popliteal and calf veins including the posterior tibial, peroneal and gastrocnemius veins when visible. The superficial great saphenous vein was also interrogated. Spectral Doppler was utilized to evaluate flow at rest and with distal augmentation maneuvers in the common femoral, femoral and popliteal veins. COMPARISON:  None. FINDINGS: Contralateral Common Femoral Vein: Respiratory phasicity is normal and symmetric with the symptomatic side. No evidence of thrombus. Normal compressibility. Common Femoral Vein: No evidence of thrombus. Normal compressibility, respiratory phasicity and response to augmentation. Saphenofemoral Junction: No evidence of thrombus. Normal compressibility and flow on color Doppler imaging. Profunda Femoral Vein: No evidence of thrombus. Normal compressibility and flow on color Doppler imaging. Femoral Vein: No evidence of thrombus. Normal compressibility, respiratory phasicity and response to augmentation. Popliteal Vein: No evidence of thrombus. Normal compressibility, respiratory phasicity and response to augmentation. Calf Veins: No evidence of thrombus. Normal compressibility and flow on color Doppler imaging. Superficial Great Saphenous Vein: No evidence of thrombus. Normal compressibility. Venous Reflux:  None. Other Findings:  None. IMPRESSION: No evidence of deep venous thrombosis. Electronically Signed   By: Alcide Clever M.D.   On: 06/12/2021 21:02      Subjective: Patient seen and examined at the bedside this morning.  Hemodynamically stable for discharge today.  Discharge Exam: Vitals:   06/17/21 0321 06/17/21 0822  BP: (!) 151/70 (!) 141/72  Pulse: 72 78  Resp: 16 18  Temp: 98.2 F (36.8 C) 98.9 F (37.2 C)  SpO2: 96% 100%    Vitals:   06/16/21 1835 06/16/21 1953 06/17/21 0321 06/17/21 0822  BP: 136/78 (!) 128/59 (!) 151/70 (!) 141/72  Pulse: 78 73 72 78  Resp: 18 20 16 18   Temp: 97.6 F (36.4 C) 97.8 F (36.6 C) 98.2 F (36.8 C) 98.9 F (37.2 C)  TempSrc: Oral Oral Oral   SpO2: 99% 98% 96% 100%  Weight:      Height:        General: Pt is alert, awake, not in acute distress Cardiovascular: RRR, S1/S2 +, no rubs, no gallops Respiratory: CTA bilaterally, no wheezing, no rhonchi Abdominal: Soft, NT, ND, bowel sounds + Extremities: no edema, no cyanosis    The results of significant diagnostics from this hospitalization (including imaging, microbiology, ancillary and laboratory) are listed below for reference.     Microbiology: Recent Results (from the past 240 hour(s))  Resp Panel by RT-PCR (Flu A&B, Covid) Nasopharyngeal Swab     Status: None   Collection Time: 06/12/21 11:57 PM   Specimen: Nasopharyngeal Swab; Nasopharyngeal(NP) swabs in vial transport medium  Result Value Ref Range Status   SARS Coronavirus 2 by RT PCR NEGATIVE NEGATIVE Final    Comment: (NOTE) SARS-CoV-2 target nucleic acids are NOT DETECTED.  The SARS-CoV-2 RNA is generally detectable in upper respiratory specimens during the acute phase of infection. The lowest concentration of SARS-CoV-2 viral copies this assay can detect is 138 copies/mL. A negative result does not preclude SARS-Cov-2 infection and should not be used as the sole basis for treatment or other patient management decisions. A negative result may occur with  improper specimen collection/handling, submission of specimen other than nasopharyngeal swab, presence of viral mutation(s) within the areas targeted by this assay, and inadequate number of viral copies(<138 copies/mL). A negative result must be combined with clinical observations, patient history, and epidemiological information. The expected result is Negative.  Fact Sheet for Patients:   06/14/21  Fact Sheet for Healthcare Providers:  BloggerCourse.com  This test is no t yet approved or cleared by the [place] FDA and  has been authorized for detection and/or diagnosis of SARS-CoV-2 by FDA under an Emergency Use Authorization (EUA). This EUA will remain  in effect (meaning this test can be used) for the duration of the COVID-19 declaration under Section 564(b)(1) of the Act, 21 U.S.C.section 360bbb-3(b)(1), unless the authorization is terminated  or revoked sooner.       Influenza A by PCR NEGATIVE NEGATIVE Final   Influenza B by PCR NEGATIVE NEGATIVE Final    Comment: (NOTE) The Xpert Xpress SARS-CoV-2/FLU/RSV plus assay is intended as an aid in the diagnosis of influenza from Nasopharyngeal swab specimens and should not be used as a sole basis for treatment. Nasal washings and aspirates are unacceptable for Xpert Xpress SARS-CoV-2/FLU/RSV testing.  Fact Sheet for Patients: [place]  Fact Sheet for Healthcare Providers: BloggerCourse.com  This test is not yet approved or cleared by the [place] FDA and has been authorized for detection and/or diagnosis of  SARS-CoV-2 by FDA under an Emergency Use Authorization (EUA). This EUA will remain in effect (meaning this test can be used) for the duration of the COVID-19 declaration under Section 564(b)(1) of the Act, 21 U.S.C. section 360bbb-3(b)(1), unless the authorization is terminated or revoked.  Performed at [facility], [address] [place], [place] [address]   MRSA Next Gen by PCR, Nasal     Status: None   Collection Time: 06/14/21  4:48 PM   Specimen: Nasal Mucosa; Nasal Swab  Result Value Ref Range Status   MRSA by PCR Next Gen NOT DETECTED NOT DETECTED Final    Comment: (NOTE) The GeneXpert MRSA Assay (FDA approved for NASAL specimens only), is one component of a  comprehensive MRSA colonization surveillance program. It is not intended to diagnose MRSA infection nor to guide or monitor treatment for MRSA infections. Test performance is not FDA approved in patients less than 78 years old. Performed at [facility], [address] [place], [place] [address]      Labs: BNP (last 3 results) No results for input(s): BNP in the last 8760 hours. Basic Metabolic Panel: Recent Labs  Lab 06/12/21 1958 06/14/21 0542 06/16/21 0410  NA 131* 134* 131*  K 4.0 3.8 3.7  CL 94* 100 96*  CO2 27 26 25   GLUCOSE 291* 216* 227*  BUN 17 10 11   CREATININE 0.87 0.66 0.63  CALCIUM 9.3 8.8* 8.8*   Liver Function Tests: Recent Labs  Lab 06/12/21 1958  AST 15  ALT 15  ALKPHOS 77  BILITOT 1.1  PROT 7.1  ALBUMIN 3.5   No results for input(s): LIPASE, AMYLASE in the last 168 hours. No results for input(s): AMMONIA in the last 168 hours. CBC: Recent Labs  Lab 06/12/21 1958 06/14/21 0542 06/15/21 0437 06/16/21 0410  WBC 9.9 7.9 7.8 10.5  NEUTROABS 6.5 4.4  --   --   HGB 14.6 15.2 14.7 14.6  HCT 41.4 42.7 41.6 41.1  MCV 85.2 84.4 83.9 84.4  PLT 283 292 236 260   Cardiac Enzymes: No results for input(s): CKTOTAL, CKMB, CKMBINDEX, TROPONINI in the last 168 hours. BNP: Invalid input(s): POCBNP CBG: Recent Labs  Lab 06/16/21 0720 06/16/21 1123 06/16/21 1631 06/16/21 2106 06/17/21 0823  GLUCAP 242* 181* 176* 172* 190*   D-Dimer No results for input(s): DDIMER in the last 72 hours. Hgb A1c No results for input(s): HGBA1C in the last 72 hours. Lipid Profile No results for input(s): CHOL, HDL, LDLCALC, TRIG, CHOLHDL, LDLDIRECT in the last 72 hours. Thyroid function studies No results for input(s): TSH, T4TOTAL, T3FREE, THYROIDAB in the last 72 hours.  Invalid input(s): FREET3 Anemia work up No results for input(s): VITAMINB12, FOLATE, FERRITIN, TIBC, IRON, RETICCTPCT in the last 72 hours. Urinalysis No results found for:  COLORURINE, APPEARANCEUR, LABSPEC, PHURINE, GLUCOSEU, HGBUR, BILIRUBINUR, KETONESUR, PROTEINUR, UROBILINOGEN, NITRITE, LEUKOCYTESUR Sepsis Labs Invalid input(s): PROCALCITONIN,  WBC,  LACTICIDVEN Microbiology Recent Results (from the past 240 hour(s))  Resp Panel by RT-PCR (Flu A&B, Covid) Nasopharyngeal Swab     Status: None   Collection Time: 06/12/21 11:57 PM   Specimen: Nasopharyngeal Swab; Nasopharyngeal(NP) swabs in vial transport medium  Result Value Ref Range Status   SARS Coronavirus 2 by RT PCR NEGATIVE NEGATIVE Final    Comment: (NOTE) SARS-CoV-2 target nucleic acids are NOT DETECTED.  The SARS-CoV-2 RNA is generally detectable in upper respiratory specimens during the acute phase of infection. The lowest concentration of SARS-CoV-2 viral copies this assay can detect  is 138 copies/mL. A negative result does not preclude SARS-Cov-2 infection and should not be used as the sole basis for treatment or other patient management decisions. A negative result may occur with  improper specimen collection/handling, submission of specimen other than nasopharyngeal swab, presence of viral mutation(s) within the areas targeted by this assay, and inadequate number of viral copies(<138 copies/mL). A negative result must be combined with clinical observations, patient history, and epidemiological information. The expected result is Negative.  Fact Sheet for Patients:  BloggerCourse.com  Fact Sheet for Healthcare Providers:  [employer]  This test is no t yet approved or cleared by the [place] FDA and  has been authorized for detection and/or diagnosis of SARS-CoV-2 by FDA under an Emergency Use Authorization (EUA). This EUA will remain  in effect (meaning this test can be used) for the duration of the COVID-19 declaration under Section 564(b)(1) of the Act, 21 U.S.C.section 360bbb-3(b)(1), unless the authorization is  terminated  or revoked sooner.       Influenza A by PCR NEGATIVE NEGATIVE Final   Influenza B by PCR NEGATIVE NEGATIVE Final    Comment: (NOTE) The Xpert Xpress SARS-CoV-2/FLU/RSV plus assay is intended as an aid in the diagnosis of influenza from Nasopharyngeal swab specimens and should not be used as a sole basis for treatment. Nasal washings and aspirates are unacceptable for Xpert Xpress SARS-CoV-2/FLU/RSV testing.  Fact Sheet for Patients: BloggerCourse.com  Fact Sheet for Healthcare Providers: [employer]it  This test is not yet approved or cleared by the [place] FDA and has been authorized for detection and/or diagnosis of SARS-CoV-2 by FDA under an Emergency Use Authorization (EUA). This EUA will remain in effect (meaning this test can be used) for the duration of the COVID-19 declaration under Section 564(b)(1) of the Act, 21 U.S.C. section 360bbb-3(b)(1), unless the authorization is terminated or revoked.  Performed at [facility], [address] [place], [place] [address]   MRSA Next Gen by PCR, Nasal     Status: None   Collection Time: 06/14/21  4:48 PM   Specimen: Nasal Mucosa; Nasal Swab  Result Value Ref Range Status   MRSA by PCR Next Gen NOT DETECTED NOT DETECTED Final    Comment: (NOTE) The GeneXpert MRSA Assay (FDA approved for NASAL specimens only), is one component of a comprehensive MRSA colonization surveillance program. It is not intended to diagnose MRSA infection nor to guide or monitor treatment for MRSA infections. Test performance is not FDA approved in patients less than 52 years old. Performed at [facility], [address], [place], [place] [address]     Please note: You were cared for by a hospitalist during your hospital stay. Once you are discharged, your primary care physician will handle any further medical issues. Please note that NO REFILLS for any  discharge medications will be authorized once you are discharged, as it is imperative that you return to your primary care physician (or establish a relationship with a primary care physician if you do not have one) for your post hospital discharge needs so that they can reassess your need for medications and monitor your lab values.    Time coordinating discharge: 40 minutes  SIGNED:   Burnadette Pop, MD  [facility] 06/17/2021, 10:20 AM Pager [phone]  If 7PM-7AM, please contact night-coverage [email or web address] Password [email or web address]

## 2021-06-18 ENCOUNTER — Encounter: Payer: Self-pay | Admitting: Vascular Surgery

## 2021-06-20 ENCOUNTER — Encounter (INDEPENDENT_AMBULATORY_CARE_PROVIDER_SITE_OTHER): Payer: Medicare Other

## 2021-06-20 ENCOUNTER — Ambulatory Visit (INDEPENDENT_AMBULATORY_CARE_PROVIDER_SITE_OTHER): Payer: Medicare Other | Admitting: Nurse Practitioner

## 2021-07-11 ENCOUNTER — Ambulatory Visit (INDEPENDENT_AMBULATORY_CARE_PROVIDER_SITE_OTHER): Payer: Medicare Other

## 2021-07-11 ENCOUNTER — Ambulatory Visit (INDEPENDENT_AMBULATORY_CARE_PROVIDER_SITE_OTHER): Payer: Medicare Other | Admitting: Nurse Practitioner

## 2021-07-11 ENCOUNTER — Other Ambulatory Visit (INDEPENDENT_AMBULATORY_CARE_PROVIDER_SITE_OTHER): Payer: Self-pay | Admitting: Nurse Practitioner

## 2021-07-11 ENCOUNTER — Encounter (INDEPENDENT_AMBULATORY_CARE_PROVIDER_SITE_OTHER): Payer: Self-pay | Admitting: Nurse Practitioner

## 2021-07-11 ENCOUNTER — Other Ambulatory Visit: Payer: Self-pay

## 2021-07-11 VITALS — BP 157/76 | HR 75 | Resp 16 | Ht 68.0 in | Wt 189.8 lb

## 2021-07-11 DIAGNOSIS — I70239 Atherosclerosis of native arteries of right leg with ulceration of unspecified site: Secondary | ICD-10-CM | POA: Diagnosis not present

## 2021-07-11 DIAGNOSIS — I7025 Atherosclerosis of native arteries of other extremities with ulceration: Secondary | ICD-10-CM

## 2021-07-11 DIAGNOSIS — Z95820 Peripheral vascular angioplasty status with implants and grafts: Secondary | ICD-10-CM

## 2021-07-11 DIAGNOSIS — Z794 Long term (current) use of insulin: Secondary | ICD-10-CM | POA: Diagnosis not present

## 2021-07-11 DIAGNOSIS — E782 Mixed hyperlipidemia: Secondary | ICD-10-CM

## 2021-07-11 DIAGNOSIS — E119 Type 2 diabetes mellitus without complications: Secondary | ICD-10-CM | POA: Diagnosis not present

## 2021-07-15 ENCOUNTER — Encounter (INDEPENDENT_AMBULATORY_CARE_PROVIDER_SITE_OTHER): Payer: Self-pay | Admitting: Nurse Practitioner

## 2021-07-15 NOTE — Progress Notes (Signed)
? ?Subjective:  ? ? Patient ID: Manuel [name], male    DOB: 15-May-1945, 76 y.o.   MRN: [id number] ?Chief Complaint  ?Patient presents with  ? Follow-up  ?  ultrasound  ? ? ?Manuel [name] is a 76 year old male who presents today after recent intervention following ischemia after intervention on 05/14/2021.  The patient notes that he did not take his Plavix as he did not believe it was necessary and subsequently followed up in the hospital on 06/13/2021 with an ischemic leg.  Following thrombectomy and stent placement the SFA and popliteal artery the patient reports that his pain is much improved compared to previous.  He also notes that his wound has been healing well. ? ?Today noninvasive studies show an ABI of 1.09 on the right and 1.10 on the left.  This is markedly improved from the previous ABI of 0.63 on the right and 0.79 on the left.  He also has a TBI 0.99 on the right and 1.04 on the left.  The right has biphasic/monophasic waveforms with normal toe waveforms.  The left has biphasic waveforms with normal toe waveforms. ? ? ?Review of Systems  ?Skin:  Positive for wound.  ?All other systems reviewed and are negative. ? ?   ?Objective:  ? Physical Exam ?Vitals reviewed.  ?HENT:  ?   Head: Normocephalic.  ?Cardiovascular:  ?   Rate and Rhythm: Normal rate.  ?   Pulses:     ?     Dorsalis pedis pulses are 1+ on the right side and 1+ on the left side.  ?Pulmonary:  ?   Effort: Pulmonary effort is normal.  ?Skin: ?   General: Skin is warm and dry.  ?Neurological:  ?   Mental Status: He is alert and oriented to person, place, and time.  ?Psychiatric:     ?   Mood and Affect: Mood normal.     ?   Behavior: Behavior normal.     ?   Thought Content: Thought content normal.     ?   Judgment: Judgment normal.  ? ? ?BP (!) 157/76 (BP Location: Right Arm)   Pulse 75   Resp 16   Ht 5\' 8"  (1.727 m)   Wt 189 lb 12.8 oz (86.1 kg)   BMI 28.86 kg/m?  ? ?Past Medical History:  ?Diagnosis Date  ? Allergies   ? Arthritis    ? Diabetes mellitus without complication (HCC)   ? Hard of hearing   ? Hyperlipidemia   ? Hypertension   ? Myocardial infarction [facility]) 2008  ? treated here at [facility] and transferred to [place] for stent placement  ? Neuropathy   ? knees down  ? Peripheral vascular disease (HCC)   ? Sleep apnea   ? "in the past"  ? Status post primary angioplasty with coronary stent   ? Wears dentures   ? full upper and lower  ? ? ?Social History  ? ?Socioeconomic History  ? Marital status: Married  ?  Spouse name: Not on file  ? Number of children: Not on file  ? Years of education: Not on file  ? Highest education level: Not on file  ?Occupational History  ? Not on file  ?Tobacco Use  ? Smoking status: Never  ? Smokeless tobacco: Never  ?Vaping Use  ? Vaping Use: Never used  ?Substance and Sexual Activity  ? Alcohol use: Yes  ?  Alcohol/week: 21.0 standard drinks  ?  Types: 7 Glasses of  wine, 14 Cans of beer per week  ?  Comment: occassional  ? Drug use: No  ? Sexual activity: Not on file  ?Other Topics Concern  ? Not on file  ?Social History Narrative  ? Not on file  ? ?Social Determinants of Health  ? ?Financial Resource Strain: Not on file  ?Food Insecurity: Not on file  ?Transportation Needs: Not on file  ?Physical Activity: Not on file  ?Stress: Not on file  ?Social Connections: Not on file  ?Intimate Partner Violence: Not on file  ? ? ?Past Surgical History:  ?Procedure Laterality Date  ? CATARACT EXTRACTION W/PHACO Right 03/14/2021  ? Procedure: CATARACT EXTRACTION PHACO AND INTRAOCULAR LENS PLACEMENT (IOC) RIGHT DIABETIC;  Surgeon: Lockie MolaBrasington, Chadwick, MD;  [facility];  Service: Ophthalmology;  Laterality: Right;  Diabetic ?16.78 ?01:39.9  ? CATARACT EXTRACTION W/PHACO Left 03/28/2021  ? Procedure: CATARACT EXTRACTION PHACO AND INTRAOCULAR LENS PLACEMENT (IOC) LEFT DIABETIC 6.93 01:22.0;  Surgeon: Lockie MolaBrasington, Chadwick, MD;  [facility];  Service: Ophthalmology;  Laterality: Left;  Diabetic   ? LOWER EXTREMITY ANGIOGRAPHY Right 05/14/2021  ? Procedure: LOWER EXTREMITY ANGIOGRAPHY;  Surgeon: Annice Needyew, Jason S, MD;  Location: [facility];  Service: Cardiovascular;  Laterality: Right;  ? LOWER EXTREMITY ANGIOGRAPHY Right 06/14/2021  ? Procedure: Lower Extremity Angiography;  Surgeon: Annice Needyew, Jason S, MD;  Location: [facility];  Service: Cardiovascular;  Laterality: Right;  ? LOWER EXTREMITY INTERVENTION Right 06/15/2021  ? Procedure: LOWER EXTREMITY INTERVENTION;  Surgeon: Annice Needyew, Jason S, MD;  Location: [facility];  Service: Cardiovascular;  Laterality: Right;  ? TONSILLECTOMY    ? had removed as a child  ? ? ?Family History  ?Problem Relation Age of Onset  ? Scoliosis Mother   ? Heart disease Father   ? ? ?No Known Allergies ? ? ?  Latest Ref Rng & Units 06/16/2021  ?  4:10 AM 06/15/2021  ?  4:37 AM 06/14/2021  ?  5:42 AM  ?CBC  ?WBC 4.0 - 10.5 K/uL 10.5   7.8   7.9    ?Hemoglobin 13.0 - 17.0 g/dL 14.714.6   82.914.7   56.215.2    ?Hematocrit 39.0 - 52.0 % 41.1   41.6   42.7    ?Platelets 150 - 400 K/uL 260   236   292    ? ? ? ? ?CMP  ?   ?Component Value Date/Time  ? NA 131 (L) 06/16/2021 0410  ? NA 137 02/16/2012 2013  ? K 3.7 06/16/2021 0410  ? K 3.8 02/16/2012 2013  ? CL 96 (L) 06/16/2021 0410  ? CL 102 02/16/2012 2013  ? CO2 25 06/16/2021 0410  ? CO2 25 02/16/2012 2013  ? GLUCOSE 227 (H) 06/16/2021 0410  ? GLUCOSE 154 (H) 02/16/2012 2013  ? BUN 11 06/16/2021 0410  ? BUN 10 02/16/2012 2013  ? CREATININE 0.63 06/16/2021 0410  ? CREATININE 0.72 02/16/2012 2013  ? CALCIUM 8.8 (L) 06/16/2021 0410  ? CALCIUM 9.6 02/16/2012 2013  ? PROT 7.1 06/12/2021 1958  ? PROT 7.5 02/16/2012 2013  ? ALBUMIN 3.5 06/12/2021 1958  ? ALBUMIN 4.4 02/16/2012 2013  ? AST 15 06/12/2021 1958  ? AST 52 (H) 02/16/2012 2013  ? ALT 15 06/12/2021 1958  ? ALT 101 (H) 02/16/2012 2013  ? ALKPHOS 77 06/12/2021 1958  ? ALKPHOS 77 02/16/2012 2013  ? BILITOT 1.1 06/12/2021 1958  ? BILITOT 0.6 02/16/2012 2013  ? GFRNONAA >60 06/16/2021  0410  ?  GFRNONAA >60 02/16/2012 2013  ? GFRAA >60 12/09/2018 0929  ? GFRAA >60 02/16/2012 2013  ? ? ? ?VAS [place] ABI WITH/WO TBI ? ?Result Date: 06/18/2021 ? LOWER EXTREMITY DOPPLER STUDY Patient Name:  GALVIN AVERSA  Date of Exam:   06/07/2021 Medical Rec #: [id number]         Accession #:    [id number] Date of Birth: 28-Jun-1945         Patient Gender: M Patient Age:   73 years Exam Location:  [facility] Procedure:      VAS [place] ABI WITH/WO TBI Referring Phys: Festus Barren --------------------------------------------------------------------------------  Indications: Peripheral artery disease.  Vascular Interventions: 05/14/2021: Aortogram and Selctive Right Lower Extremity                         Angiogram. PTA Left CIA. PTA Right Peroneal Artery                         Tibioperoneal Trunk. PTA of the Right Distal SFA. Stent                         placement to the Right SFA Distal segment and Proximal                         Popliteal Artery. Comparison Study: 05/08/2021 Performing Technologist: Debbe Bales RVS  Examination Guidelines: A complete evaluation includes at minimum, Doppler waveform signals and systolic blood pressure reading at the level of bilateral brachial, anterior tibial, and posterior tibial arteries, when vessel segments are accessible. Bilateral testing is considered an integral part of a complete examination. Photoelectric Plethysmograph (PPG) waveforms and toe systolic pressure readings are included as required and additional duplex testing as needed. Limited examinations for reoccurring indications may be performed as noted.  ABI Findings: +---------+------------------+-----+----------+--------+ Right    Rt Pressure (mmHg)IndexWaveform  Comment  +---------+------------------+-----+----------+--------+ Brachial 153                                       +---------+------------------+-----+----------+--------+ ATA      46                0.29 monophasic          +---------+------------------+-----+----------+--------+ PTA      101               0.63 monophasic         +---------+------------------+-----+----------+--------+ Great Toe129               0.80 Normal             +---------+-----

## 2021-10-15 ENCOUNTER — Other Ambulatory Visit (INDEPENDENT_AMBULATORY_CARE_PROVIDER_SITE_OTHER): Payer: Self-pay | Admitting: Vascular Surgery

## 2021-10-15 DIAGNOSIS — I7090 Unspecified atherosclerosis: Secondary | ICD-10-CM

## 2021-10-16 ENCOUNTER — Encounter (INDEPENDENT_AMBULATORY_CARE_PROVIDER_SITE_OTHER): Payer: Medicare Other

## 2021-10-16 ENCOUNTER — Ambulatory Visit (INDEPENDENT_AMBULATORY_CARE_PROVIDER_SITE_OTHER): Payer: Medicare Other | Admitting: Vascular Surgery

## 2021-11-09 ENCOUNTER — Emergency Department
Admission: EM | Admit: 2021-11-09 | Discharge: 2021-11-09 | Discharge status: Against Medical Advice | Payer: Medicare Other | Attending: Emergency Medicine | Admitting: Emergency Medicine

## 2021-11-09 ENCOUNTER — Emergency Department: Payer: Medicare Other

## 2021-11-09 ENCOUNTER — Other Ambulatory Visit: Payer: Self-pay

## 2021-11-09 DIAGNOSIS — R42 Dizziness and giddiness: Secondary | ICD-10-CM | POA: Diagnosis not present

## 2021-11-09 DIAGNOSIS — I10 Essential (primary) hypertension: Secondary | ICD-10-CM | POA: Diagnosis not present

## 2021-11-09 DIAGNOSIS — Z5321 Procedure and treatment not carried out due to patient leaving prior to being seen by health care provider: Secondary | ICD-10-CM | POA: Insufficient documentation

## 2021-11-09 DIAGNOSIS — R55 Syncope and collapse: Secondary | ICD-10-CM | POA: Diagnosis present

## 2021-11-09 DIAGNOSIS — R1032 Left lower quadrant pain: Secondary | ICD-10-CM | POA: Insufficient documentation

## 2021-11-09 LAB — CBC
HCT: 44.7 % (ref 39.0–52.0)
Hemoglobin: 15.8 g/dL (ref 13.0–17.0)
MCH: 30.1 pg (ref 26.0–34.0)
MCHC: 35.3 g/dL (ref 30.0–36.0)
MCV: 85.1 fL (ref 80.0–100.0)
Platelets: 261 10*3/uL (ref 150–400)
RBC: 5.25 MIL/uL (ref 4.22–5.81)
RDW: 12.9 % (ref 11.5–15.5)
WBC: 9 10*3/uL (ref 4.0–10.5)
nRBC: 0 % (ref 0.0–0.2)

## 2021-11-09 LAB — BASIC METABOLIC PANEL
Anion gap: 9 (ref 5–15)
BUN: 17 mg/dL (ref 8–23)
CO2: 24 mmol/L (ref 22–32)
Calcium: 9.3 mg/dL (ref 8.9–10.3)
Chloride: 97 mmol/L — ABNORMAL LOW (ref 98–111)
Creatinine, Ser: 0.97 mg/dL (ref 0.61–1.24)
GFR, Estimated: >60 mL/min (ref >60)
Glucose, Bld: 406 mg/dL — ABNORMAL HIGH (ref 70–99)
Potassium: 4.4 mmol/L (ref 3.5–5.1)
Sodium: 130 mmol/L — ABNORMAL LOW (ref 135–145)

## 2021-11-09 LAB — TROPONIN I (HIGH SENSITIVITY): Troponin I (High Sensitivity): 7 ng/L (ref <18)

## 2021-11-09 LAB — LIPASE, BLOOD: Lipase: 31 U/L (ref 11–51)

## 2021-11-09 NOTE — ED Provider Triage Note (Signed)
Emergency Medicine Provider Triage Evaluation Note  VIDIT BOISSONNEAULT , a 76 y.o. male  was evaluated in triage.  Pt complains of near syncope for approximately 5 days.  Patient associates his current symptoms with when he had his MI 15 years ago.  He denies current chest pain or chest tightness.  Patient does have a history of alcohol use and states that he has had at least 2 alcoholic beverages today which is less than usual.  No shortness of breath, vomiting or abdominal pain.  Review of Systems  Positive: Patient has lightheadedness. Negative: No chest pain or abdominal pain.   Physical Exam  BP (!) 154/107 (BP Location: Right Arm)   Pulse 76   Temp 97.8 F (36.6 C) (Oral)   Resp 17   SpO2 98%  Gen:   Awake, no distress   Resp:  Normal effort  MSK:   Moves extremities without difficulty  Other:    Medical Decision Making  Medically screening exam initiated at 6:22 PM.  Appropriate orders placed.  DAUD CAYER was informed that the remainder of the evaluation will be completed by another provider, this initial triage assessment does not replace that evaluation, and the importance of remaining in the ED until their evaluation is complete.     Pia Mau [place], [place] 11/09/21 1823

## 2021-11-09 NOTE — ED Triage Notes (Addendum)
Pt presents to ED with c/o of having a near syncope episode while out eating today. Pt states he has not been feeling well for the past few days. Pt states he went home to take BP and states it read "high". Pt states pain LL ABD. Pt is A&Ox4, pt ambulatory with steady gait in triage.   Pt does endorse ETOH today.

## 2021-12-14 ENCOUNTER — Telehealth (INDEPENDENT_AMBULATORY_CARE_PROVIDER_SITE_OTHER): Payer: Self-pay | Admitting: Nurse Practitioner

## 2021-12-14 ENCOUNTER — Other Ambulatory Visit (INDEPENDENT_AMBULATORY_CARE_PROVIDER_SITE_OTHER): Payer: Self-pay

## 2021-12-14 MED ORDER — CLOPIDOGREL BISULFATE 75 MG PO TABS: 75.0000 mg | ORAL_TABLET | Freq: Every day | ORAL | 2 refills | Status: DC

## 2021-12-14 NOTE — Telephone Encounter (Signed)
Refill has been sent to [facility] 

## 2021-12-14 NOTE — Telephone Encounter (Signed)
Pt called requesting a 90 day supply of clopidogrel 75 mg be sent to [facility] on [address]. [place], [place] Please advise patient.

## 2022-01-24 ENCOUNTER — Other Ambulatory Visit (INDEPENDENT_AMBULATORY_CARE_PROVIDER_SITE_OTHER): Payer: Self-pay | Admitting: Nurse Practitioner

## 2022-01-24 DIAGNOSIS — I739 Peripheral vascular disease, unspecified: Secondary | ICD-10-CM

## 2022-01-25 ENCOUNTER — Ambulatory Visit (INDEPENDENT_AMBULATORY_CARE_PROVIDER_SITE_OTHER): Payer: Medicare Other | Admitting: Vascular Surgery

## 2022-01-25 ENCOUNTER — Ambulatory Visit (INDEPENDENT_AMBULATORY_CARE_PROVIDER_SITE_OTHER): Payer: Medicare Other

## 2022-01-25 ENCOUNTER — Encounter (INDEPENDENT_AMBULATORY_CARE_PROVIDER_SITE_OTHER): Payer: Self-pay | Admitting: Vascular Surgery

## 2022-01-25 VITALS — BP 118/66 | HR 68 | Resp 16 | Wt 196.8 lb

## 2022-01-25 DIAGNOSIS — I10 Essential (primary) hypertension: Secondary | ICD-10-CM

## 2022-01-25 DIAGNOSIS — I739 Peripheral vascular disease, unspecified: Secondary | ICD-10-CM | POA: Diagnosis not present

## 2022-01-25 DIAGNOSIS — I7025 Atherosclerosis of native arteries of other extremities with ulceration: Secondary | ICD-10-CM

## 2022-01-25 DIAGNOSIS — E782 Mixed hyperlipidemia: Secondary | ICD-10-CM | POA: Diagnosis not present

## 2022-01-25 DIAGNOSIS — E119 Type 2 diabetes mellitus without complications: Secondary | ICD-10-CM

## 2022-01-25 DIAGNOSIS — Z794 Long term (current) use of insulin: Secondary | ICD-10-CM

## 2022-01-25 DIAGNOSIS — Z9889 Other specified postprocedural states: Secondary | ICD-10-CM

## 2022-01-25 NOTE — Assessment & Plan Note (Signed)
His ABIs today are reasonably well-preserved at 0.91 on the right and 0.88 on the left with monophasic waveforms.  Duplex shows what appears to be a 75 to 99% stenosis with markedly elevated velocities in the proximal SFA. His ulcers have now healed and he does not have rest pain.  We discussed these findings and discussed angiogram with possible revascularization.  We discussed that he may very well thrombosis intervention due to the high-grade SFA stenosis.  This is happened relatively slowly so there may be significant collateral blood flow that has developed so it is hard to know exactly how ischemic he will be if this happens.  I have offered him this angiogram but he is not really interested in having it done and would prefer a short interval follow-up which is reasonable given the fact that he does not have any immediate limb threatening symptoms, and he was not optimizing his medical therapy.  We have asked him to resume his statin and continue his aspirin and Plavix.  We will see him back in 3 months with noninvasive studies.

## 2022-01-25 NOTE — Progress Notes (Signed)
MRN : [id number]  Manuel [name] is a 76 y.o. (April 03, 1946) male who presents with chief complaint of  Chief Complaint  Patient presents with   Follow-up    Ultrasound follow up  .  History of Present Illness: Patient returns today in follow up of his PAD.  He underwent revascularization for PAD with ulceration of the right foot back in February.  He did not take his clopidogrel and developed thrombosis of his intervention in March.  We were able to get this back open with overnight thrombolytic therapy and repeat intervention.  Since that time, he is taking his clopidogrel every day.  He has not been taking his statin however.  His ABIs today are reasonably well-preserved at 0.91 on the right and 0.88 on the left with monophasic waveforms.  Duplex shows what appears to be a 75 to 99% stenosis with markedly elevated velocities in the proximal SFA.  He does not have any rest pain or ulceration.  Current Outpatient Medications  Medication Sig Dispense Refill   amLODipine (NORVASC) 5 MG tablet Take 10 mg by mouth daily.      aspirin EC 81 MG tablet Take 1 tablet (81 mg total) by mouth daily. 150 tablet 2   atorvastatin (LIPITOR) 80 MG tablet Take 1 tablet (80 mg total) by mouth daily. 30 tablet 1   carvedilol (COREG) 12.5 MG tablet Take 1 tablet by mouth 2 (two) times daily.     clopidogrel (PLAVIX) 75 MG tablet Take 1 tablet (75 mg total) by mouth daily. 90 tablet 2   gabapentin (NEURONTIN) 100 MG capsule Take 100 mg by mouth at bedtime.     gabapentin (NEURONTIN) 400 MG capsule Take by mouth 2 (two) times daily.     Ibuprofen 200 MG CAPS Take 2 tablets by mouth 3 times/day as needed-between meals & bedtime.     insulin aspart protamine- aspart (NOVOLOG MIX 70/30) (70-30) 100 UNIT/ML injection Inject 32 Units into the skin 2 (two) times daily with a meal.     polyethylene glycol (MIRALAX / GLYCOLAX) 17 g packet Take 17 g by mouth daily. 14 each 0   senna-docusate (SENOKOT-S) 8.6-50 MG  tablet Take 1 tablet by mouth at bedtime as needed for mild constipation. 30 tablet 0   apixaban (ELIQUIS) 5 MG TABS tablet Take 1 tablet (5 mg total) by mouth 2 (two) times daily. (Patient not taking: Reported on 01/25/2022) 60 tablet 1   DULoxetine (CYMBALTA) 30 MG capsule Take 30 mg by mouth daily. (Patient not taking: Reported on 07/11/2021)     fluticasone (FLONASE) 50 MCG/ACT nasal spray Place 2 sprays into both nostrils as needed for allergies or rhinitis. (Patient not taking: Reported on 07/11/2021)     HYDROcodone-acetaminophen (NORCO/VICODIN) 5-325 MG tablet Take 1 tablet by mouth every 4 (four) hours as needed for moderate pain. (Patient not taking: Reported on 01/25/2022) 20 tablet 0   lisinopril (ZESTRIL) 20 MG tablet Take 1 tablet (20 mg total) by mouth daily. (Patient not taking: Reported on 07/11/2021) 30 tablet 1   zolpidem (AMBIEN) 5 MG tablet Take 1 tablet (5 mg total) by mouth at bedtime as needed for up to 5 doses for sleep. (Patient not taking: Reported on 07/11/2021) 5 tablet 0   No current facility-administered medications for this visit.    Past Medical History:  Diagnosis Date   Allergies    Arthritis    Diabetes mellitus without complication (HCC)    Hard of hearing  Hyperlipidemia    Hypertension    Myocardial infarction [facility]) 2008   treated here at [facility] and transferred to [place] for stent placement   Neuropathy    knees down   Peripheral vascular disease (HCC)    Sleep apnea    "in the past"   Status post primary angioplasty with coronary stent    Wears dentures    full upper and lower    Past Surgical History:  Procedure Laterality Date   CATARACT EXTRACTION W/PHACO Right 03/14/2021   Procedure: CATARACT EXTRACTION PHACO AND INTRAOCULAR LENS PLACEMENT (IOC) RIGHT DIABETIC;  Surgeon: Lockie Mola, MD;  Location: [facility];  Service: Ophthalmology;  Laterality: Right;  Diabetic 16.78 01:39.9   CATARACT EXTRACTION W/PHACO Left 03/28/2021    Procedure: CATARACT EXTRACTION PHACO AND INTRAOCULAR LENS PLACEMENT (IOC) LEFT DIABETIC 6.93 01:22.0;  Surgeon: Lockie Mola, MD;  Location: [facility];  Service: Ophthalmology;  Laterality: Left;  Diabetic   LOWER EXTREMITY ANGIOGRAPHY Right 05/14/2021   Procedure: LOWER EXTREMITY ANGIOGRAPHY;  Surgeon: Annice Needy, MD;  Location: [facility];  Service: Cardiovascular;  Laterality: Right;   LOWER EXTREMITY ANGIOGRAPHY Right 06/14/2021   Procedure: Lower Extremity Angiography;  Surgeon: Annice Needy, MD;  Location: [facility];  Service: Cardiovascular;  Laterality: Right;   LOWER EXTREMITY INTERVENTION Right 06/15/2021   Procedure: LOWER EXTREMITY INTERVENTION;  Surgeon: Annice Needy, MD;  Location: [facility];  Service: Cardiovascular;  Laterality: Right;   TONSILLECTOMY     had removed as a child     Social History   Tobacco Use   Smoking status: Never   Smokeless tobacco: Never  Vaping Use   Vaping Use: Never used  Substance Use Topics   Alcohol use: Yes    Alcohol/week: 21.0 standard drinks of alcohol    Types: 7 Glasses of wine, 14 Cans of beer per week    Comment: occassional   Drug use: No      Family History  Problem Relation Age of Onset   Scoliosis Mother    Heart disease Father   No bleeding or clotting disorders  No Known Allergies   REVIEW OF SYSTEMS (Negative unless checked)   Constitutional: [] Weight loss  [] Fever  [] Chills Cardiac: [] Chest pain   [] Chest pressure   [] Palpitations   [] Shortness of breath when laying flat   [] Shortness of breath at rest   [] Shortness of breath with exertion. Vascular:  [x] Pain in legs with walking   [] Pain in legs at rest   [] Pain in legs when laying flat   [] Claudication   [] Pain in feet when walking  [x] Pain in feet at rest  [] Pain in feet when laying flat   [] History of DVT   [] Phlebitis   [] Swelling in legs   [] Varicose veins   [x] Non-healing ulcers Pulmonary:   [] Uses home  oxygen   [] Productive cough   [] Hemoptysis   [] Wheeze  [] COPD   [] Asthma Neurologic:  [] Dizziness  [] Blackouts   [] Seizures   [] History of stroke   [x] History of TIA  [] Aphasia   [] Temporary blindness   [] Dysphagia   [] Weakness or numbness in arms   [] Weakness or numbness in legs Musculoskeletal:  [x] Arthritis   [] Joint swelling   [x] Joint pain   [] Low back pain Hematologic:  [] Easy bruising  [] Easy bleeding   [] Hypercoagulable state   [] Anemic  [] Hepatitis Gastrointestinal:  [] Blood in stool   [] Vomiting blood  [] Gastroesophageal reflux/heartburn   [] Abdominal pain Genitourinary:  []   Chronic kidney disease   [] Difficult urination  [] Frequent urination  [] Burning with urination   [] Hematuria Skin:  [] Rashes   [x] Ulcers   [x] Wounds Psychological:  [] History of anxiety   []  History of major depression.  Physical Examination  BP 118/66 (BP Location: Left Arm)   Pulse 68   Resp 16   Wt 196 lb 12.8 oz (89.3 kg)   BMI 29.92 kg/m  Gen:  WD/WN, NAD Head: [place]/AT, No temporalis wasting. Ear/Nose/Throat: Hearing grossly intact, nares w/o erythema or drainage Eyes: Conjunctiva clear. Sclera non-icteric Neck: Supple.  Trachea midline Pulmonary:  Good air movement, no use of accessory muscles.  Cardiac: RRR, no JVD Vascular:  Vessel Right Left  Radial Palpable Palpable                          PT 1+ Palpable 1+ Palpable  DP 1+ Palpable 1+ Palpable   Gastrointestinal: soft, non-tender/non-distended. No guarding/reflex.  Musculoskeletal: M/S 5/5 throughout.  No deformity or atrophy. Trace LE edema. Neurologic: Sensation grossly intact in extremities.  Symmetrical.  Speech is fluent.  Psychiatric: Judgment intact, Mood & affect appropriate for pt's clinical situation. Dermatologic: No rashes or ulcers noted.  No cellulitis or open wounds.      Labs Recent Results (from the past 2160 hour(s))  Basic metabolic panel     Status: Abnormal   Collection Time: 11/09/21  6:16 PM  Result Value  Ref Range   Sodium 130 (L) 135 - 145 mmol/L   Potassium 4.4 3.5 - 5.1 mmol/L   Chloride 97 (L) 98 - 111 mmol/L   CO2 24 22 - 32 mmol/L   Glucose, Bld 406 (H) 70 - 99 mg/dL    Comment: Glucose reference range applies only to samples taken after fasting for at least 8 hours.   BUN 17 8 - 23 mg/dL   Creatinine, Ser 0.97 0.61 - 1.24 mg/dL   Calcium 9.3 8.9 - 10.3 mg/dL   GFR, Estimated >60 >60 mL/min    Comment: (NOTE) Calculated using the CKD-EPI Creatinine Equation (2021)    Anion gap 9 5 - 15    Comment: Performed at [facility], [address] [place], [place] [address]  CBC     Status: None   Collection Time: 11/09/21  6:16 PM  Result Value Ref Range   WBC 9.0 4.0 - 10.5 K/uL   RBC 5.25 4.22 - 5.81 MIL/uL   Hemoglobin 15.8 13.0 - 17.0 g/dL   HCT 44.7 39.0 - 52.0 %   MCV 85.1 80.0 - 100.0 fL   MCH 30.1 26.0 - 34.0 pg   MCHC 35.3 30.0 - 36.0 g/dL   RDW 12.9 11.5 - 15.5 %   Platelets 261 150 - 400 K/uL   nRBC 0.0 0.0 - 0.2 %    Comment: Performed at [facility], [place], [place] [address]  Troponin I (High Sensitivity)     Status: None   Collection Time: 11/09/21  6:16 PM  Result Value Ref Range   Troponin I (High Sensitivity) 7 <18 ng/L    Comment: (NOTE) Elevated high sensitivity troponin I (hsTnI) values and significant  changes across serial measurements may suggest ACS but many other  chronic and acute conditions are known to elevate hsTnI results.  Refer to the "Links" section for chest pain algorithms and additional  guidance. Performed at [facility], [address] [place], [place] [address]   Lipase, blood  Status: None   Collection Time: 11/09/21  6:16 PM  Result Value Ref Range   Lipase 31 11 - 51 U/L    Comment: Performed at [facility], [address] [place], [place] [address]    Radiology No results found.  Assessment/Plan Benign essential hypertension blood pressure control  important in reducing the progression of atherosclerotic disease. On appropriate oral medications.     Diabetes mellitus type 2, insulin dependent (HCC) blood glucose control important in reducing the progression of atherosclerotic disease. Also, involved in wound healing. On appropriate medications.     Mixed hyperlipidemia lipid control important in reducing the progression of atherosclerotic disease. Resume statin therapy  Atherosclerosis of native arteries of the extremities with ulceration (HCC) His ABIs today are reasonably well-preserved at 0.91 on the right and 0.88 on the left with monophasic waveforms.  Duplex shows what appears to be a 75 to 99% stenosis with markedly elevated velocities in the proximal SFA. His ulcers have now healed and he does not have rest pain.  We discussed these findings and discussed angiogram with possible revascularization.  We discussed that he may very well thrombosis intervention due to the high-grade SFA stenosis.  This is happened relatively slowly so there may be significant collateral blood flow that has developed so it is hard to know exactly how ischemic he will be if this happens.  I have offered him this angiogram but he is not really interested in having it done and would prefer a short interval follow-up which is reasonable given the fact that he does not have any immediate limb threatening symptoms, and he was not optimizing his medical therapy.  We have asked him to resume his statin and continue his aspirin and Plavix.  We will see him back in 3 months with noninvasive studies.    Festus Barren, MD  01/25/2022 11:26 AM    This note was created with Dragon medical transcription system.  Any errors from dictation are purely unintentional

## 2022-04-30 ENCOUNTER — Ambulatory Visit (INDEPENDENT_AMBULATORY_CARE_PROVIDER_SITE_OTHER): Payer: Medicare Other | Admitting: Nurse Practitioner

## 2022-04-30 ENCOUNTER — Encounter (INDEPENDENT_AMBULATORY_CARE_PROVIDER_SITE_OTHER): Payer: Medicare Other

## 2022-05-21 ENCOUNTER — Encounter (INDEPENDENT_AMBULATORY_CARE_PROVIDER_SITE_OTHER): Payer: Medicare Other

## 2022-05-21 ENCOUNTER — Ambulatory Visit (INDEPENDENT_AMBULATORY_CARE_PROVIDER_SITE_OTHER): Payer: Medicare Other | Admitting: Nurse Practitioner

## 2022-06-07 ENCOUNTER — Ambulatory Visit (INDEPENDENT_AMBULATORY_CARE_PROVIDER_SITE_OTHER): Payer: Medicare Other | Admitting: Nurse Practitioner

## 2022-06-07 ENCOUNTER — Ambulatory Visit (INDEPENDENT_AMBULATORY_CARE_PROVIDER_SITE_OTHER): Payer: Medicare Other

## 2022-06-07 ENCOUNTER — Encounter (INDEPENDENT_AMBULATORY_CARE_PROVIDER_SITE_OTHER): Payer: Self-pay | Admitting: Nurse Practitioner

## 2022-06-07 VITALS — BP 154/80 | HR 72 | Resp 16 | Wt 201.4 lb

## 2022-06-07 DIAGNOSIS — E119 Type 2 diabetes mellitus without complications: Secondary | ICD-10-CM | POA: Diagnosis not present

## 2022-06-07 DIAGNOSIS — Z9889 Other specified postprocedural states: Secondary | ICD-10-CM

## 2022-06-07 DIAGNOSIS — I7025 Atherosclerosis of native arteries of other extremities with ulceration: Secondary | ICD-10-CM

## 2022-06-07 DIAGNOSIS — M7989 Other specified soft tissue disorders: Secondary | ICD-10-CM

## 2022-06-07 DIAGNOSIS — I739 Peripheral vascular disease, unspecified: Secondary | ICD-10-CM | POA: Diagnosis not present

## 2022-06-07 DIAGNOSIS — Z794 Long term (current) use of insulin: Secondary | ICD-10-CM

## 2022-06-09 ENCOUNTER — Encounter (INDEPENDENT_AMBULATORY_CARE_PROVIDER_SITE_OTHER): Payer: Self-pay | Admitting: Nurse Practitioner

## 2022-06-09 NOTE — Progress Notes (Signed)
Subjective:    Patient ID: Manuel [name], male    DOB: 1946-04-22, 77 y.o.   MRN: [id number] Chief Complaint  Patient presents with   Follow-up    Ultrasound follow up    Manuel [name] is a 77 year old male who returns today for follow-up noninvasive studies.  He denies any significant claudication.  The patient does have known significant peripheral neuropathy in his feet and he notes that it is not any worse than normal.  He has small fissures in his heels bilaterally but these are healing with the treatment done by podiatry.  He denies any nonhealing open wounds or ulcerations.  He denies anything consistent with rest pain.  The other concern that the patient has is regarding swelling in his left lower extremity.  He notes that some days it is much improved or if some days it becomes worse the more he is on his feet.  He notes that it is better in the morning and progressively worse at the end of the day.  He denies any open wounds or ulcerations consistent with venous wounds.  He denies any weeping of the left lower extremity.  Today right lower extremity notes a 0.81 ABI in the left and 0.90.  Previously the ABI was 0.91 and 0.88 on the left.  The patient has monophasic tibial artery waveforms bilaterally.  Slightly dampened toe waveforms on the right but normal on the left.    Review of Systems  Cardiovascular:  Positive for leg swelling.  All other systems reviewed and are negative.      Objective:   Physical Exam Vitals reviewed.  HENT:     Head: Normocephalic.  Cardiovascular:     Rate and Rhythm: Normal rate.  Pulmonary:     Effort: Pulmonary effort is normal.  Musculoskeletal:     Left lower leg: Edema present.  Skin:    General: Skin is warm and dry.  Neurological:     Mental Status: He is alert and oriented to person, place, and time.  Psychiatric:        Mood and Affect: Mood normal.        Behavior: Behavior normal.        Thought Content: Thought content  normal.        Judgment: Judgment normal.     BP (!) 154/80 (BP Location: Left Arm)   Pulse 72   Resp 16   Wt 201 lb 6.4 oz (91.4 kg)   BMI 30.62 kg/m   Past Medical History:  Diagnosis Date   Allergies    Arthritis    Diabetes mellitus without complication ([place])    Hard of hearing    Hyperlipidemia    Hypertension    Myocardial infarction [facility]) 2008   treated here at [facility] and transferred to [place] for stent placement   Neuropathy    knees down   Peripheral vascular disease ([place])    Sleep apnea    "in the past"   Status post primary angioplasty with coronary stent    Wears dentures    full upper and lower    Social History   Socioeconomic History   Marital status: Married    Spouse name: Not on file   Number of children: Not on file   Years of education: Not on file   Highest education level: Not on file  Occupational History   Not on file  Tobacco Use   Smoking status: Never   Smokeless tobacco: Never  Vaping Use   Vaping Use: Never used  Substance and Sexual Activity   Alcohol use: Yes    Alcohol/week: 21.0 standard drinks of alcohol    Types: 7 Glasses of wine, 14 Cans of beer per week    Comment: occassional   Drug use: No   Sexual activity: Not on file  Other Topics Concern   Not on file  Social History Narrative   Not on file   Social Determinants of Health   Financial Resource Strain: Not on file  Food Insecurity: Not on file  Transportation Needs: Not on file  Physical Activity: Not on file  Stress: Not on file  Social Connections: Not on file  Intimate Partner Violence: Not on file    Past Surgical History:  Procedure Laterality Date   CATARACT EXTRACTION W/PHACO Right 03/14/2021   Procedure: CATARACT EXTRACTION PHACO AND INTRAOCULAR LENS PLACEMENT ([place]) RIGHT DIABETIC;  Surgeon: Leandrew Koyanagi, MD;  Location: [place];  Service: Ophthalmology;  Laterality: Right;  Diabetic 16.78 01:39.9   CATARACT EXTRACTION  W/PHACO Left 03/28/2021   Procedure: CATARACT EXTRACTION PHACO AND INTRAOCULAR LENS PLACEMENT ([facility]) LEFT DIABETIC 6.93 01:22.0;  Surgeon: Leandrew Koyanagi, MD;  Location: [place];  Service: Ophthalmology;  Laterality: Left;  Diabetic   LOWER EXTREMITY ANGIOGRAPHY Right 05/14/2021   Procedure: LOWER EXTREMITY ANGIOGRAPHY;  Surgeon: Algernon Huxley, MD;  Location: [place] [facility];  Service: Cardiovascular;  Laterality: Right;   LOWER EXTREMITY ANGIOGRAPHY Right 06/14/2021   Procedure: Lower Extremity Angiography;  Surgeon: Algernon Huxley, MD;  Location: [place] [facility];  Service: Cardiovascular;  Laterality: Right;   LOWER EXTREMITY INTERVENTION Right 06/15/2021   Procedure: LOWER EXTREMITY INTERVENTION;  Surgeon: Algernon Huxley, MD;  Location: [place] [facility];  Service: Cardiovascular;  Laterality: Right;   TONSILLECTOMY     had removed as a child    Family History  Problem Relation Age of Onset   Scoliosis Mother    Heart disease Father     No Known Allergies     Latest Ref Rng & Units 11/09/2021    6:16 PM 06/16/2021    4:10 AM 06/15/2021    4:37 AM  CBC  WBC 4.0 - 10.5 K/uL 9.0  10.5  7.8   Hemoglobin 13.0 - 17.0 g/dL 15.8  14.6  14.7   Hematocrit 39.0 - 52.0 % 44.7  41.1  41.6   Platelets 150 - 400 K/uL 261  260  236       CMP     Component Value Date/Time   NA 130 (L) 11/09/2021 1816   NA 137 02/16/2012 2013   K 4.4 11/09/2021 1816   K 3.8 02/16/2012 2013   CL 97 (L) 11/09/2021 1816   CL 102 02/16/2012 2013   CO2 24 11/09/2021 1816   CO2 25 02/16/2012 2013   GLUCOSE 406 (H) 11/09/2021 1816   GLUCOSE 154 (H) 02/16/2012 2013   BUN 17 11/09/2021 1816   BUN 10 02/16/2012 2013   CREATININE 0.97 11/09/2021 1816   CREATININE 0.72 02/16/2012 2013   CALCIUM 9.3 11/09/2021 1816   CALCIUM 9.6 02/16/2012 2013   PROT 7.1 06/12/2021 1958   PROT 7.5 02/16/2012 2013   ALBUMIN 3.5 06/12/2021 1958   ALBUMIN 4.4 02/16/2012 2013   AST 15 06/12/2021 1958    AST 52 (H) 02/16/2012 2013   ALT 15 06/12/2021 1958   ALT 101 (H) 02/16/2012 2013   ALKPHOS 77 06/12/2021 1958   ALKPHOS  77 02/16/2012 2013   BILITOT 1.1 06/12/2021 1958   BILITOT 0.6 02/16/2012 2013   GFRNONAA >60 11/09/2021 1816   GFRNONAA >60 02/16/2012 2013   GFRAA >60 12/09/2018 0929   GFRAA >60 02/16/2012 2013     No results found.     Assessment & Plan:   1. Peripheral arterial disease with history of revascularization [facility]) Today are fairly consistent with previous studies he had approximately 6 months ago.  The wounds he has appear to be healing.  Advised patient that as long as his wounds continue to heal without significant difficulty can continue conservatively however if his wounds begin to worsen or deteriorate he may need intervention.  Otherwise we will plan to have the patient return in 6 weeks to evaluate his progress with wound healing.  2. Diabetes mellitus type 2, insulin dependent ([place]) Continue hypoglycemic medications as already ordered, these medications have been reviewed and there are no changes at this time.  Hgb A1C to be monitored as already arranged by primary service  3. Leg swelling Based upon the patient's description of leg swelling I suspect this has more so to do with some venous insufficiency versus something more acute such as a DVT.  Will have the patient return in 6 weeks for noninvasive studies to evaluate cause of swelling.   Current Outpatient Medications on File Prior to Visit  Medication Sig Dispense Refill   amLODipine (NORVASC) 5 MG tablet Take 10 mg by mouth daily.      aspirin EC 81 MG tablet Take 1 tablet (81 mg total) by mouth daily. 150 tablet 2   atorvastatin (LIPITOR) 80 MG tablet Take 1 tablet (80 mg total) by mouth daily. 30 tablet 1   carvedilol (COREG) 12.5 MG tablet Take 1 tablet by mouth 2 (two) times daily.     clopidogrel (PLAVIX) 75 MG tablet Take 1 tablet (75 mg total) by mouth daily. 90 tablet 2   gabapentin  (NEURONTIN) 100 MG capsule Take 100 mg by mouth at bedtime.     gabapentin (NEURONTIN) 400 MG capsule Take by mouth 2 (two) times daily.     Ibuprofen 200 MG CAPS Take 2 tablets by mouth 3 times/day as needed-between meals & bedtime.     insulin aspart protamine- aspart (NOVOLOG MIX 70/30) (70-30) 100 UNIT/ML injection Inject 32 Units into the skin 2 (two) times daily with a meal.     polyethylene glycol (MIRALAX / GLYCOLAX) 17 g packet Take 17 g by mouth daily. 14 each 0   senna-docusate (SENOKOT-S) 8.6-50 MG tablet Take 1 tablet by mouth at bedtime as needed for mild constipation. 30 tablet 0   apixaban (ELIQUIS) 5 MG TABS tablet Take 1 tablet (5 mg total) by mouth 2 (two) times daily. (Patient not taking: Reported on 01/25/2022) 60 tablet 1   DULoxetine (CYMBALTA) 30 MG capsule Take 30 mg by mouth daily. (Patient not taking: Reported on 07/11/2021)     fluticasone (FLONASE) 50 MCG/ACT nasal spray Place 2 sprays into both nostrils as needed for allergies or rhinitis. (Patient not taking: Reported on 07/11/2021)     HYDROcodone-acetaminophen (NORCO/VICODIN) 5-325 MG tablet Take 1 tablet by mouth every 4 (four) hours as needed for moderate pain. (Patient not taking: Reported on 01/25/2022) 20 tablet 0   lisinopril (ZESTRIL) 20 MG tablet Take 1 tablet (20 mg total) by mouth daily. (Patient not taking: Reported on 07/11/2021) 30 tablet 1   zolpidem (AMBIEN) 5 MG tablet Take 1 tablet (5 mg total)  by mouth at bedtime as needed for up to 5 doses for sleep. (Patient not taking: Reported on 07/11/2021) 5 tablet 0   No current facility-administered medications on file prior to visit.    There are no Patient Instructions on file for this visit. No follow-ups on file.   Kris Hartmann, NP

## 2022-06-12 LAB — VAS US ABI WITH/WO TBI
Left ABI: 0.9
Right ABI: 0.81

## 2022-07-17 ENCOUNTER — Other Ambulatory Visit (INDEPENDENT_AMBULATORY_CARE_PROVIDER_SITE_OTHER): Payer: Self-pay | Admitting: Nurse Practitioner

## 2022-07-17 DIAGNOSIS — M7989 Other specified soft tissue disorders: Secondary | ICD-10-CM

## 2022-07-25 IMAGING — US US EXTREM LOW VENOUS*R*
1 series · 13 of 24 positions shown · non-contrast
Comparison: None.

CLINICAL DATA: Recent right lower extremity angiography with pain,
initial encounter



[Series 1: us venous img lower uni right (dvt) · portal-venous · 13 of 31 slices shown]
[im 1/31]
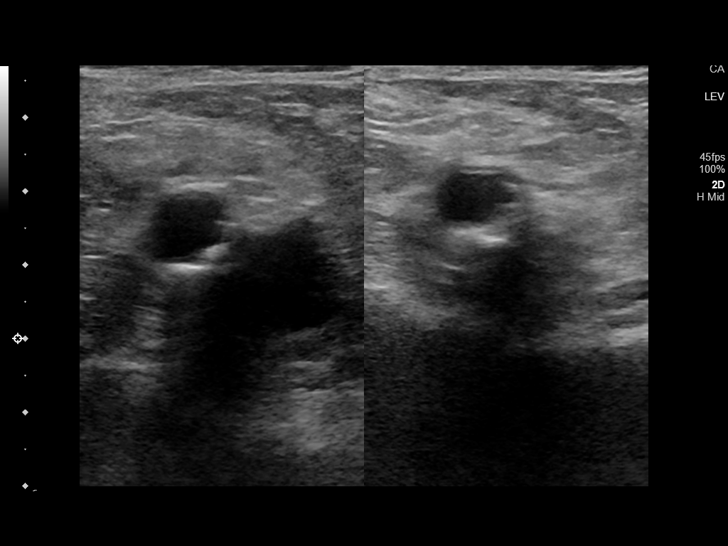
[im 3/31]
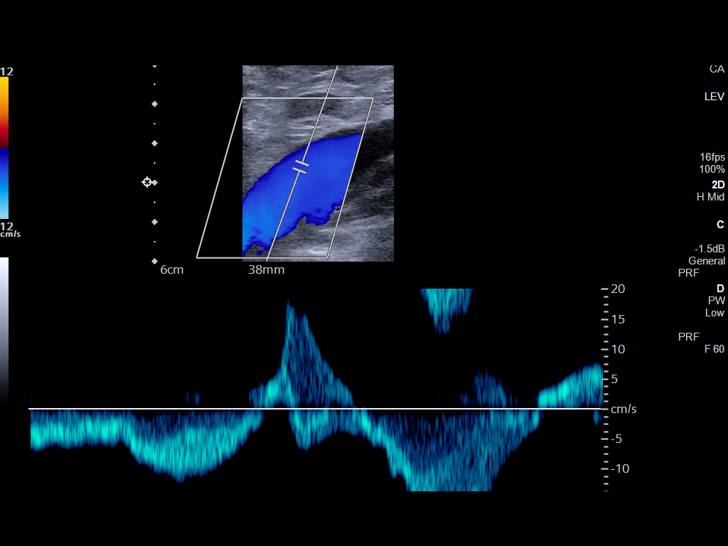
[im 6/31]
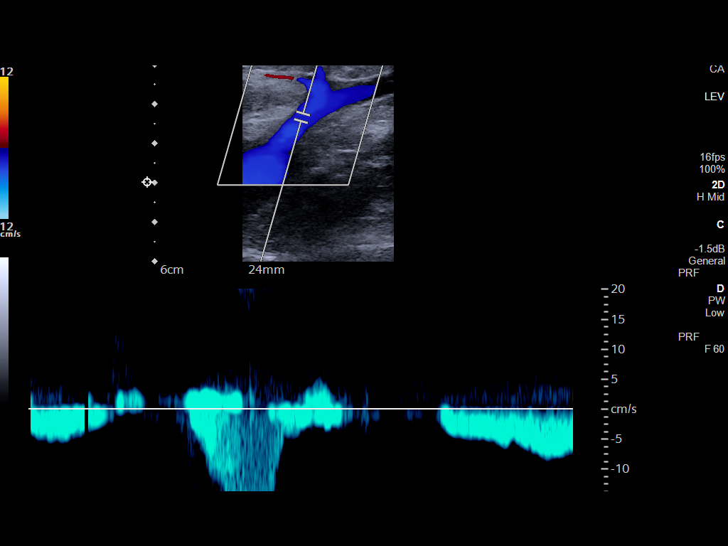
[im 8/31]
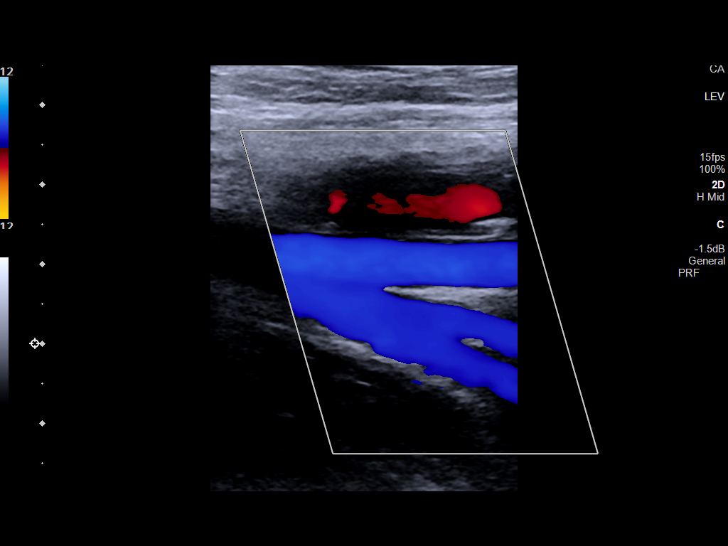
[im 11/31]
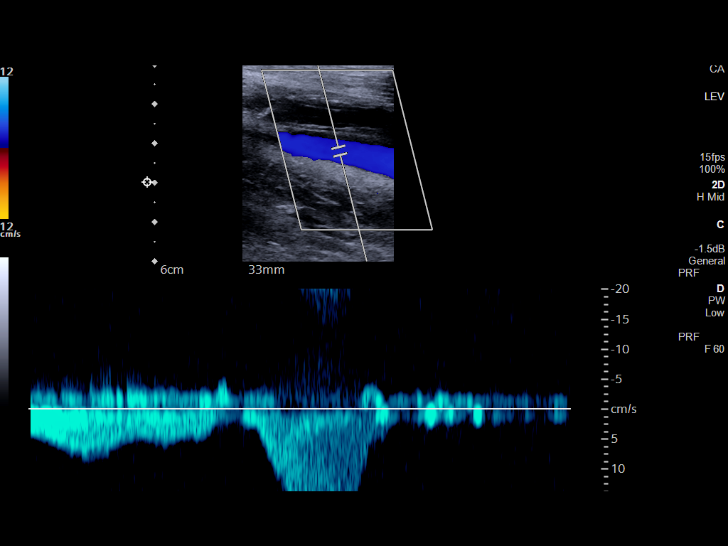
[im 14/31]
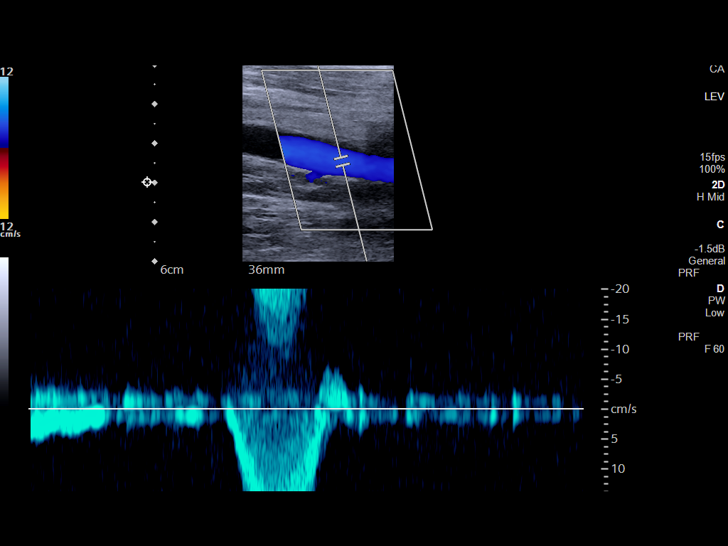
[im 16/31]
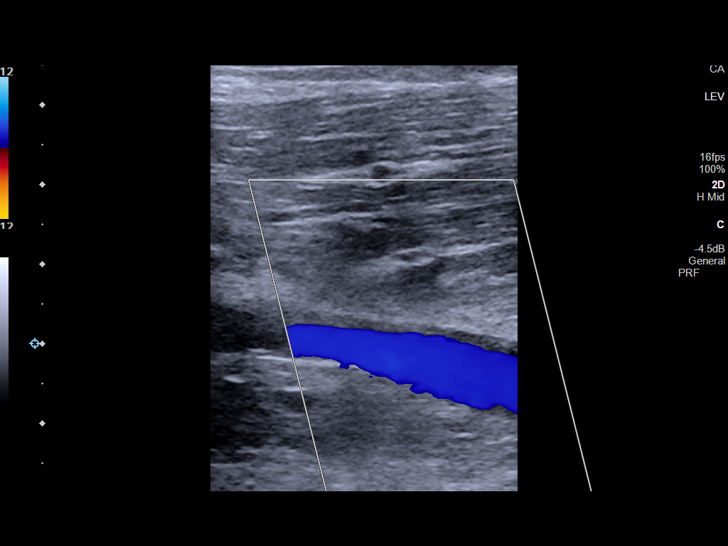
[im 17/31]
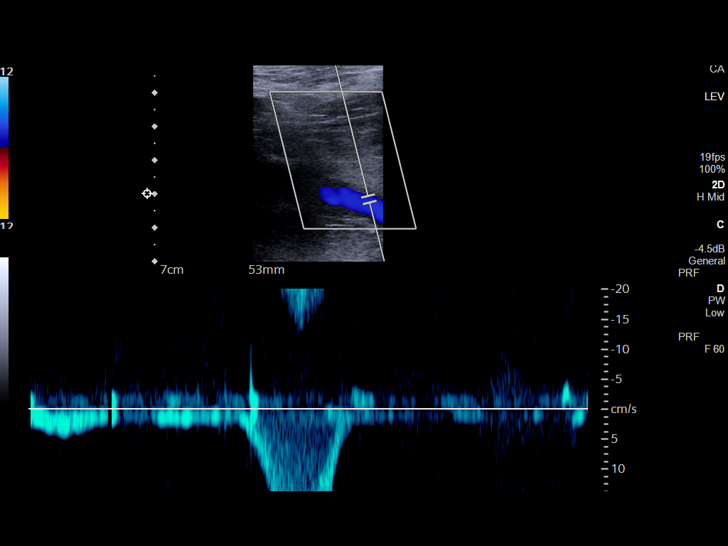
[im 20/31]
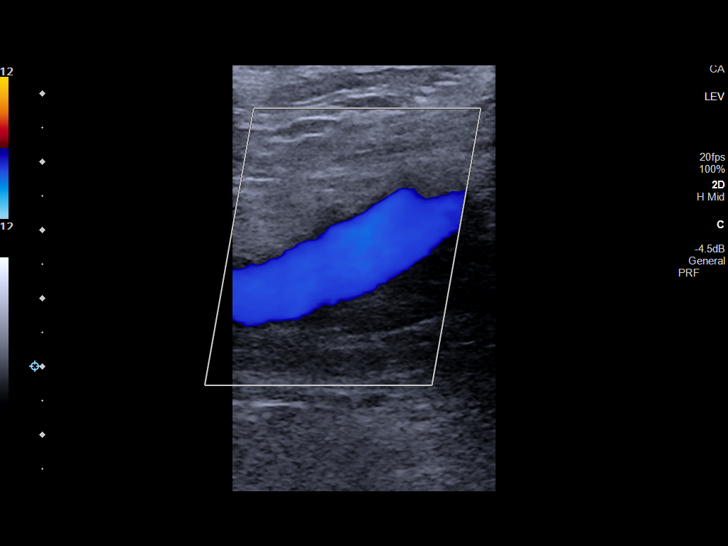
[im 23/31]
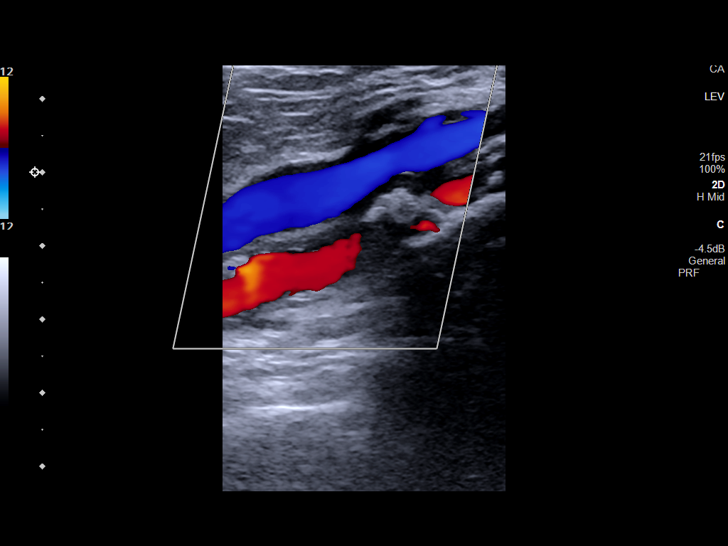
[im 25/31]
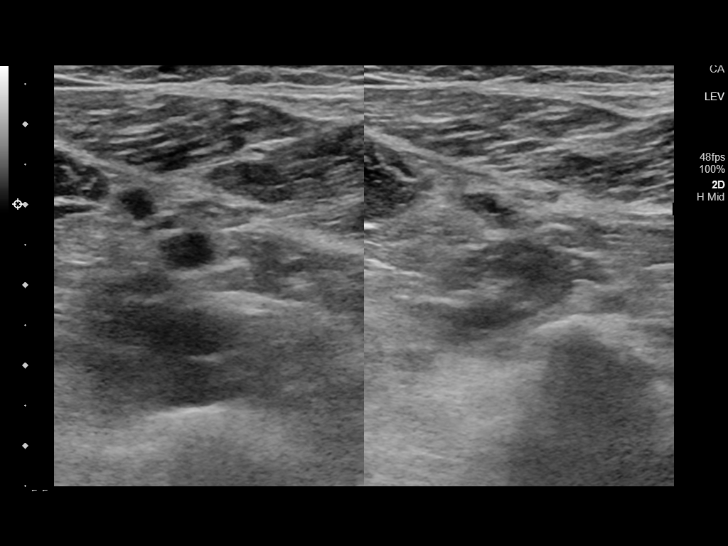
[im 28/31]
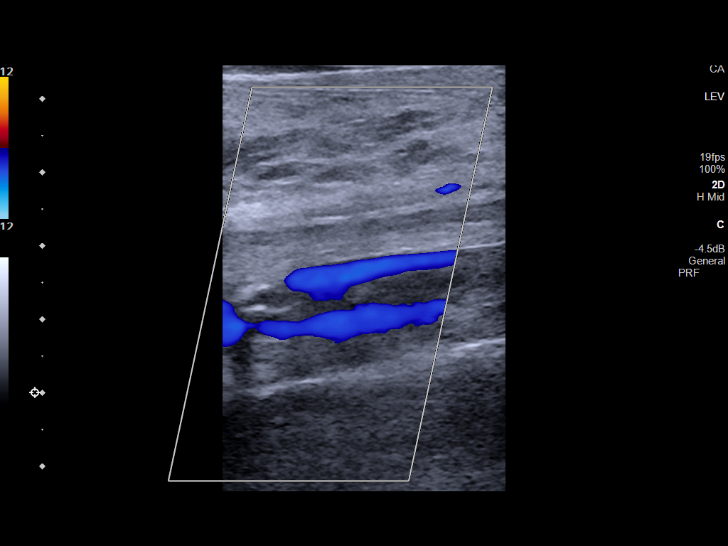
[im 31/31]
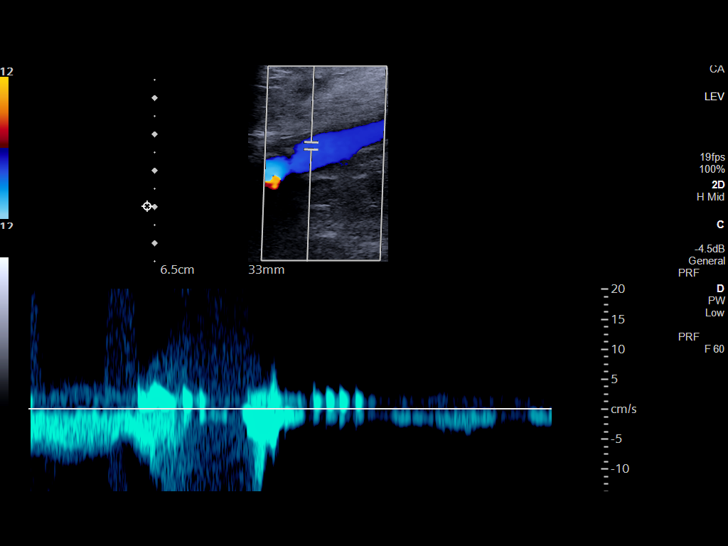

[13 of 24 positions shown; findings below may reference images not displayed]

FINDINGS: Contralateral Common Femoral Vein: Respiratory phasicity is normal
and symmetric with the symptomatic side. No evidence of thrombus.
Normal compressibility.

Common Femoral Vein: No evidence of thrombus. Normal
compressibility, respiratory phasicity and response to augmentation.

Saphenofemoral Junction: No evidence of thrombus. Normal
compressibility and flow on color Doppler imaging.

Profunda Femoral Vein: No evidence of thrombus. Normal
compressibility and flow on color Doppler imaging.

Femoral Vein: No evidence of thrombus. Normal compressibility,
respiratory phasicity and response to augmentation.

Popliteal Vein: No evidence of thrombus. Normal compressibility,
respiratory phasicity and response to augmentation.

Calf Veins: No evidence of thrombus. Normal compressibility and flow
on color Doppler imaging.

Superficial Great Saphenous Vein: No evidence of thrombus. Normal
compressibility.

Venous Reflux:  None.

Other Findings:  None.
IMPRESSION: No evidence of deep venous thrombosis.

## 2022-07-26 ENCOUNTER — Ambulatory Visit (INDEPENDENT_AMBULATORY_CARE_PROVIDER_SITE_OTHER): Payer: Medicare Other

## 2022-07-26 ENCOUNTER — Ambulatory Visit (INDEPENDENT_AMBULATORY_CARE_PROVIDER_SITE_OTHER): Payer: Medicare Other | Admitting: Vascular Surgery

## 2022-07-26 DIAGNOSIS — M7989 Other specified soft tissue disorders: Secondary | ICD-10-CM | POA: Diagnosis not present

## 2022-07-29 IMAGING — CT CT ABD-PELV W/ CM
2 of 5 series · 14 of 46 positions shown, 16 images · IV contrast (agent unspecified)
Comparison: Lumbar spine MRI on 10/12/2019

CLINICAL DATA: Abdominal pain. History of diabetes. Patient had
angioplasty on [DATE] with catheter directed thrombolytic therapy.
Sudden onset of LEFT flank pain.

EXAM:
CT ABDOMEN AND PELVIS WITH CONTRAST
TECHNIQUE: Multidetector CT imaging of the abdomen and pelvis was performed
using the standard protocol following bolus administration of
intravenous contrast.

[Series 2: routine abd/pel with · axial · 0.96mm/px · z∈[-992,-486]mm · 11 of 113 slices shown, 13 images]
[im 6/113  soft-tissue]
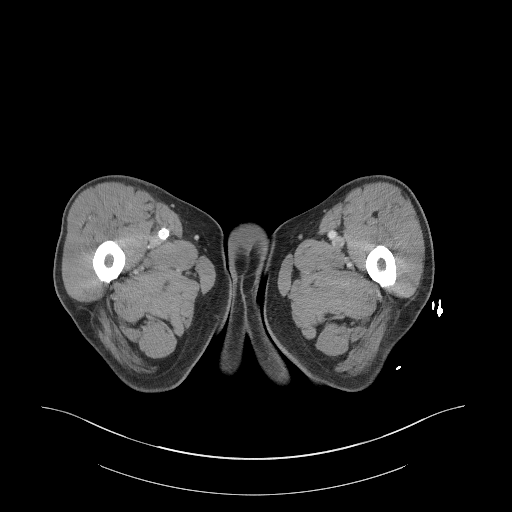
[im 6/113  bone]
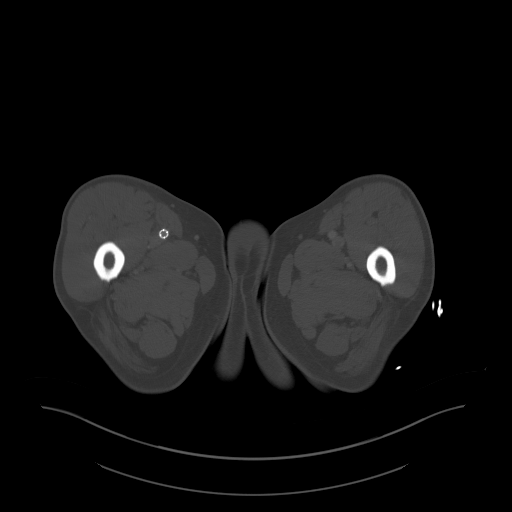
[im 18/113  soft-tissue]
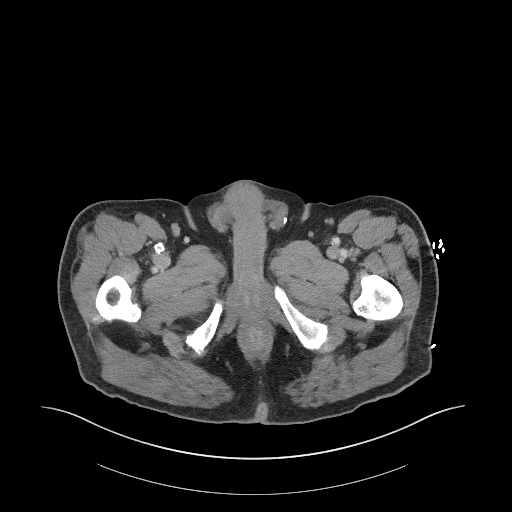
[im 30/113  soft-tissue]
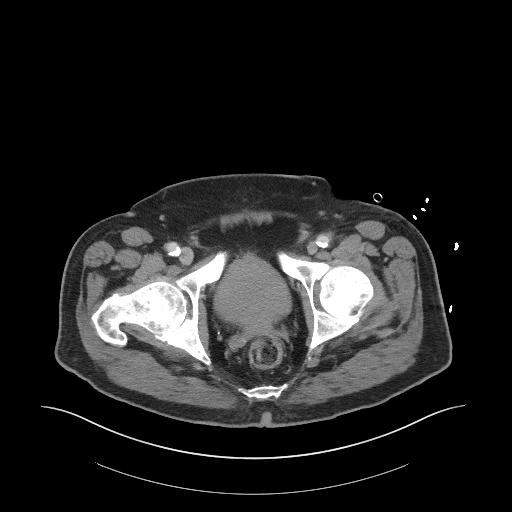
[im 36/113  soft-tissue]
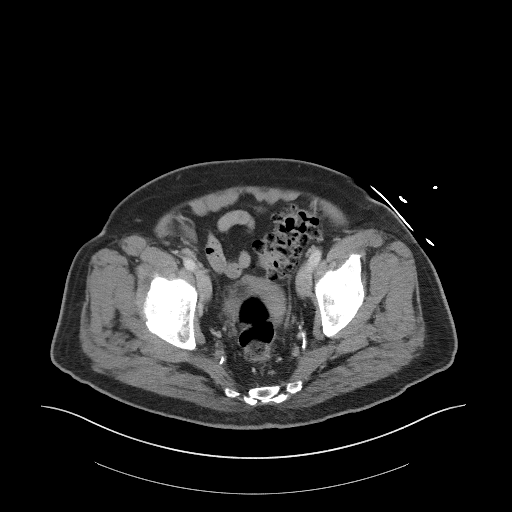
[im 48/113  soft-tissue]
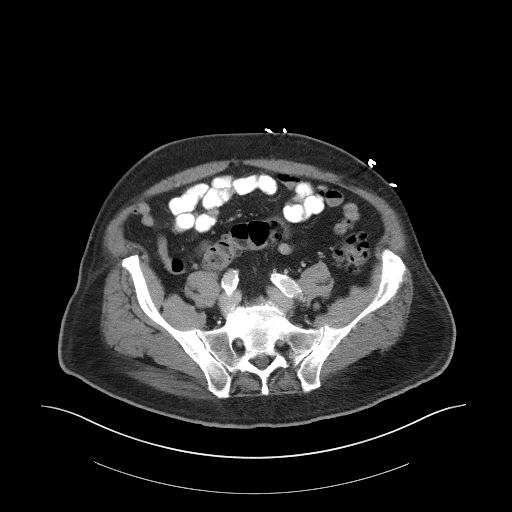
[im 59/113  soft-tissue]
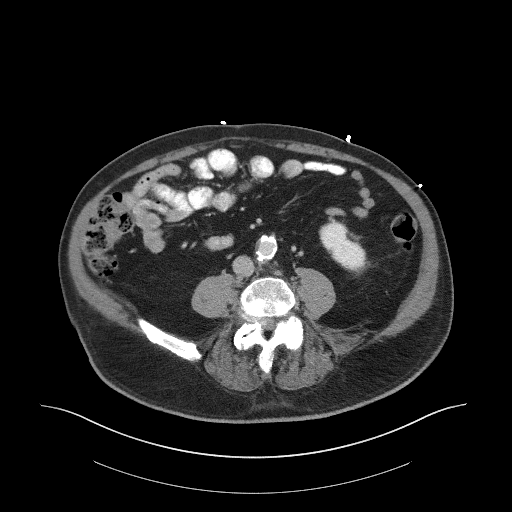
[im 65/113  soft-tissue]
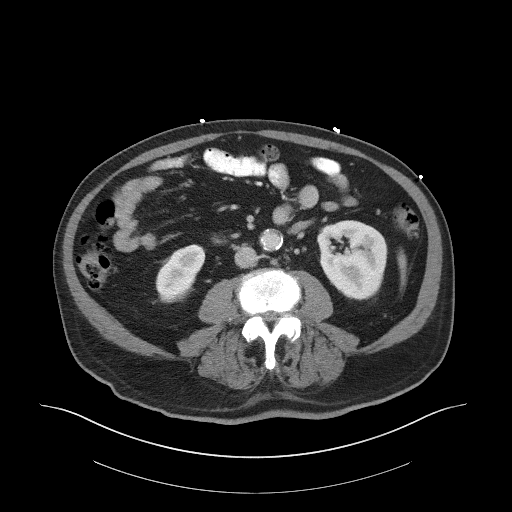
[im 77/113  soft-tissue]
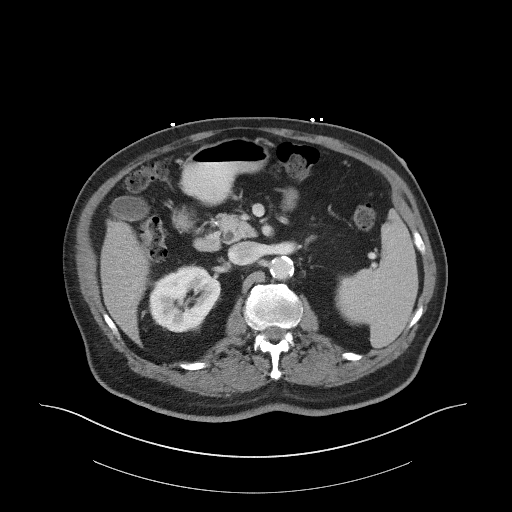
[im 83/113  soft-tissue]
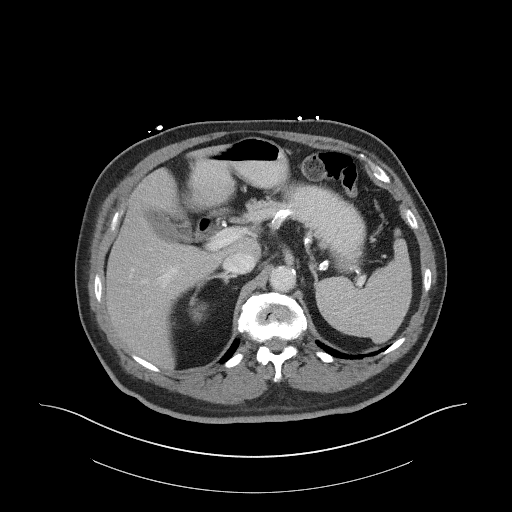
[im 83/113  bone]
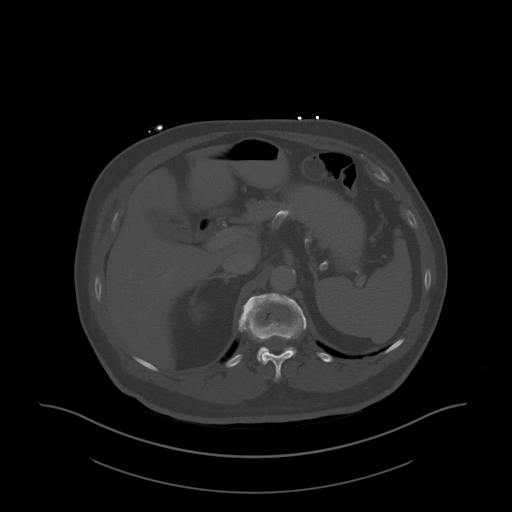
[im 95/113  soft-tissue]
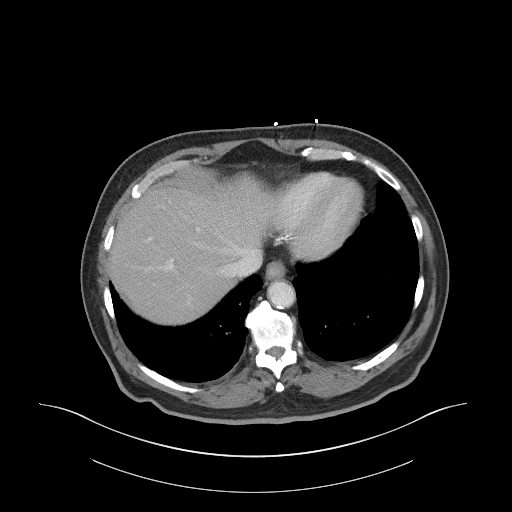
[im 107/113  soft-tissue]
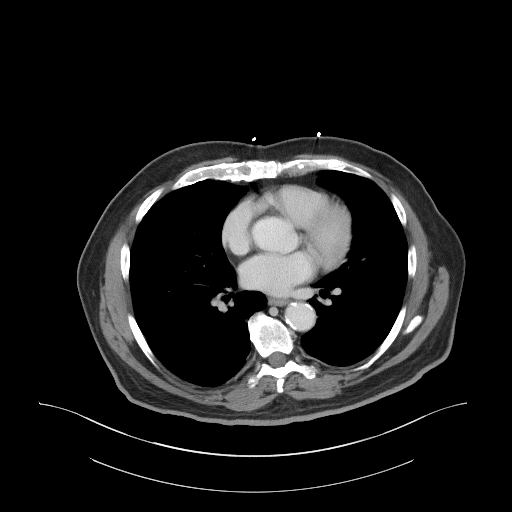

[Series 5: coronal st · coronal · 0.80mm/px · 3 of 102 slices shown]
[im 34/102  soft-tissue]
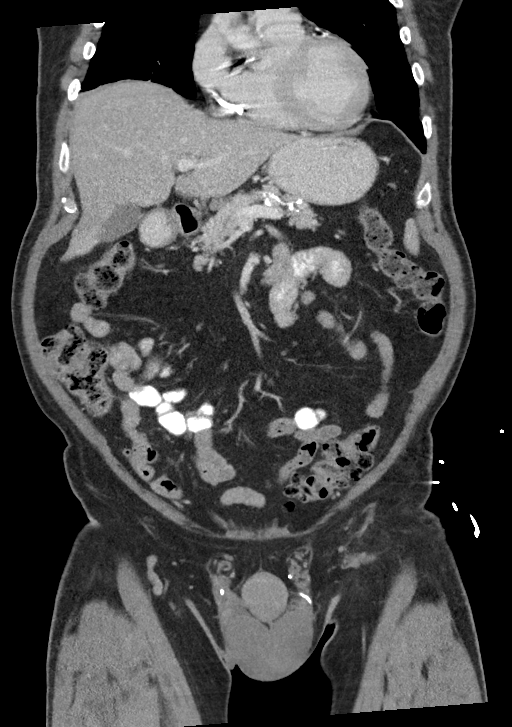
[im 45/102  soft-tissue]
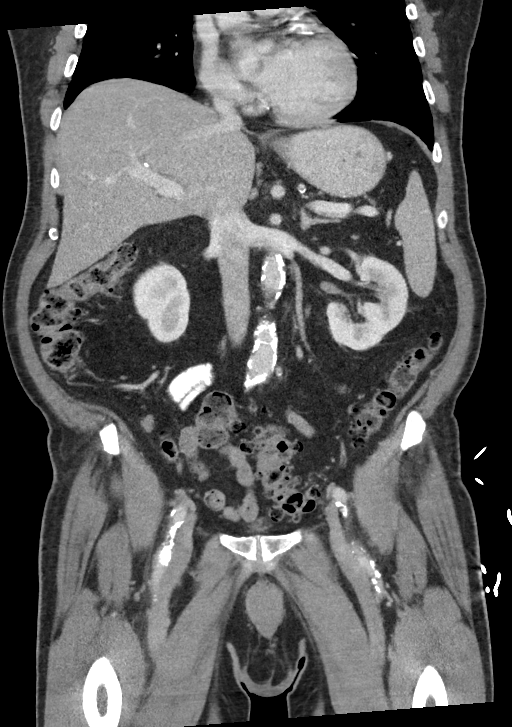
[im 57/102  soft-tissue]
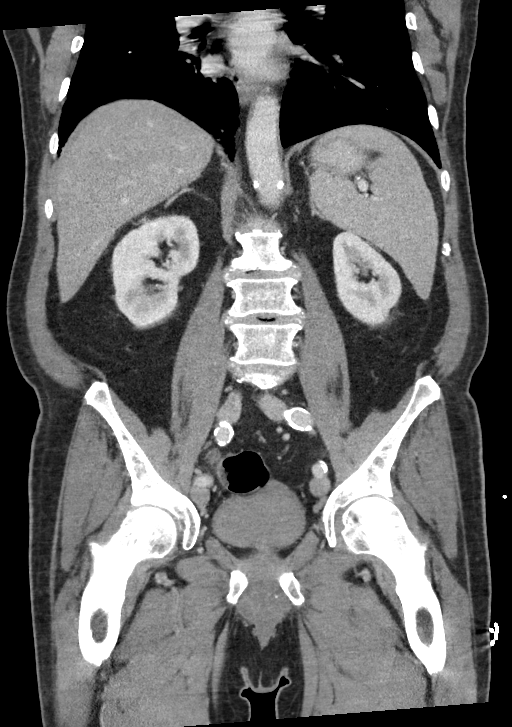

[14 of 46 positions shown; findings below may reference images not displayed]

RADIATION DOSE REDUCTION: This exam was performed according to the
departmental dose-optimization program which includes automated
exposure control, adjustment of the mA and/or kV according to
patient size and/or use of iterative reconstruction technique.

CONTRAST:  100mL OMNIPAQUE IOHEXOL 300 MG/ML  SOLN
FINDINGS: Lower chest: Lung bases are unremarkable. Heart size is normal.
There is significant coronary artery calcification. No pericardial
effusions.

Hepatobiliary: No focal liver abnormality is seen. No radiopaque
gallstones, biliary dilatation, or pericholecystic inflammatory
changes.

Pancreas: Unremarkable. No pancreatic ductal dilatation or
surrounding inflammatory changes.

Spleen: Normal in size without focal abnormality.

Adrenals/Urinary Tract: Adrenal glands are normal in appearance.

RIGHT kidney is unremarkable. The distal RIGHT ureter contains a 5
millimeter calculus, image 80 of series 2 at the ureterovesical
junction. There is no associated hydroureter or hydronephrosis.

Nonobstructing 3 millimeter calculus in the midpole region of the
LEFT kidney. The LEFT ureter is unremarkable.

The bladder and visualized portion of the urethra are normal.

Stomach/Bowel: The stomach and small bowel loops are normal in
appearance. There is extensive colonic diverticulosis, not
associated with inflammatory changes. The appendix is well seen and
normal in appearance. Significant stool burden.

Vascular/Lymphatic: There is extensive atherosclerotic calcific
calcification of the abdominal aorta. A saccular aneurysm is
identified at the iliac bifurcation measuring 1.5 x 2.3 centimeters.
(Image 58 series 2). Patient has a RIGHT femoral stent, only
partially imaged. Although not performed with early phase contrast,
the study shows no evidence for thrombosed LOWER aorta or proximal
femoral arteries.

Reproductive: Prostate is enlarged and mildly heterogeneous.

Other: Small fat containing paraumbilical hernia.  No ascites.

Musculoskeletal: Chronic superior endplate fracture of L1. Moderate
degenerative changes throughout the lumbar spine. No acute
abnormality.
IMPRESSION: 1. Extensive atherosclerotic calcification of the abdominal aorta.
2. Saccular aneurysm at the aortic bifurcation measuring 1.5 x
centimeters. Recommend evaluation by vascular surgery.
3. RIGHT femoral artery stent, incompletely imaged.
4. Incidentally noted 5 millimeter calculus in the distal RIGHT
ureter, in the region of the ureterovesical junction. There is no
associated hydronephrosis. Chronicity of this finding is not known.
5. Chronic superior endplate fracture of L1.
6. Small fat containing paraumbilical hernia.
7. Significant stool burden.
8. Colonic diverticulosis.

## 2022-08-13 ENCOUNTER — Encounter (INDEPENDENT_AMBULATORY_CARE_PROVIDER_SITE_OTHER): Payer: Self-pay | Admitting: Vascular Surgery

## 2022-08-13 ENCOUNTER — Ambulatory Visit (INDEPENDENT_AMBULATORY_CARE_PROVIDER_SITE_OTHER): Payer: Medicare Other | Admitting: Vascular Surgery

## 2022-08-13 VITALS — BP 109/69 | HR 72 | Resp 18 | Ht 68.0 in | Wt 195.0 lb

## 2022-08-13 DIAGNOSIS — E119 Type 2 diabetes mellitus without complications: Secondary | ICD-10-CM | POA: Diagnosis not present

## 2022-08-13 DIAGNOSIS — I10 Essential (primary) hypertension: Secondary | ICD-10-CM

## 2022-08-13 DIAGNOSIS — I7025 Atherosclerosis of native arteries of other extremities with ulceration: Secondary | ICD-10-CM | POA: Diagnosis not present

## 2022-08-13 DIAGNOSIS — E782 Mixed hyperlipidemia: Secondary | ICD-10-CM | POA: Diagnosis not present

## 2022-08-13 DIAGNOSIS — Z794 Long term (current) use of insulin: Secondary | ICD-10-CM

## 2022-08-13 NOTE — Assessment & Plan Note (Signed)
His wounds are now healed.  His perfusion was stable on last check a few weeks back.  He is getting over his sinus issues and no interventions planned currently.  Return to clinic in 6 months with noninvasive studies.  Continue Plavix and statin agent

## 2022-08-13 NOTE — Progress Notes (Signed)
MRN : [id number]  Manuel [name] is a 77 y.o. (30-Jan-1946) male who presents with chief complaint of  Chief Complaint  Patient presents with   Follow-up    Follow up   .  History of Present Illness: Patient returns today in follow up of his PAD and foot ulceration.  His foot ulceration has gone on to heal.  He was checked several weeks back to his perfusion was relatively well-maintained mildly reduced.  His legs are currently doing okay.  He is having a lot more issues with some sinus infection and has been on several rounds of antibiotics.  He is now seeing me in team.  Current Outpatient Medications  Medication Sig Dispense Refill   amLODipine (NORVASC) 5 MG tablet Take 10 mg by mouth daily.      atorvastatin (LIPITOR) 80 MG tablet Take 1 tablet (80 mg total) by mouth daily. 30 tablet 1   carvedilol (COREG) 12.5 MG tablet Take 1 tablet by mouth 2 (two) times daily.     cefdinir (OMNICEF) 300 MG capsule Take 300 mg by mouth 2 (two) times daily.     clopidogrel (PLAVIX) 75 MG tablet Take 1 tablet (75 mg total) by mouth daily. 90 tablet 2   gabapentin (NEURONTIN) 100 MG capsule Take 100 mg by mouth at bedtime.     gabapentin (NEURONTIN) 400 MG capsule Take by mouth 2 (two) times daily.     Ibuprofen 200 MG CAPS Take 2 tablets by mouth 3 times/day as needed-between meals & bedtime.     insulin aspart protamine- aspart (NOVOLOG MIX 70/30) (70-30) 100 UNIT/ML injection Inject 32 Units into the skin 2 (two) times daily with a meal.     lisinopril (ZESTRIL) 20 MG tablet Take 1 tablet (20 mg total) by mouth daily. 30 tablet 1   senna-docusate (SENOKOT-S) 8.6-50 MG tablet Take 1 tablet by mouth at bedtime as needed for mild constipation. 30 tablet 0   apixaban (ELIQUIS) 5 MG TABS tablet Take 1 tablet (5 mg total) by mouth 2 (two) times daily. (Patient not taking: Reported on 01/25/2022) 60 tablet 1   aspirin EC 81 MG tablet Take 1 tablet (81 mg total) by mouth daily. (Patient not taking:  Reported on 08/13/2022) 150 tablet 2   DULoxetine (CYMBALTA) 30 MG capsule Take 30 mg by mouth daily. (Patient not taking: Reported on 07/11/2021)     fluticasone (FLONASE) 50 MCG/ACT nasal spray Place 2 sprays into both nostrils as needed for allergies or rhinitis. (Patient not taking: Reported on 07/11/2021)     levocetirizine (XYZAL) 5 MG tablet Take 5 mg by mouth at bedtime. (Patient not taking: Reported on 08/13/2022)     polyethylene glycol (MIRALAX / GLYCOLAX) 17 g packet Take 17 g by mouth daily. (Patient not taking: Reported on 08/13/2022) 14 each 0   zolpidem (AMBIEN) 5 MG tablet Take 1 tablet (5 mg total) by mouth at bedtime as needed for up to 5 doses for sleep. (Patient not taking: Reported on 07/11/2021) 5 tablet 0   No current facility-administered medications for this visit.    Past Medical History:  Diagnosis Date   Allergies    Arthritis    Diabetes mellitus without complication    Hard of hearing    Hyperlipidemia    Hypertension    Myocardial infarction 2008   treated here at [facility] and transferred to [place] for stent placement   Neuropathy    knees down   Peripheral vascular disease  Sleep apnea    "in the past"   Status post primary angioplasty with coronary stent    Wears dentures    full upper and lower    Past Surgical History:  Procedure Laterality Date   CATARACT EXTRACTION W/PHACO Right 03/14/2021   Procedure: CATARACT EXTRACTION PHACO AND INTRAOCULAR LENS PLACEMENT (IOC) RIGHT DIABETIC;  Surgeon: Lockie Mola, MD;  Location: [facility];  Service: Ophthalmology;  Laterality: Right;  Diabetic 16.78 01:39.9   CATARACT EXTRACTION W/PHACO Left 03/28/2021   Procedure: CATARACT EXTRACTION PHACO AND INTRAOCULAR LENS PLACEMENT (IOC) LEFT DIABETIC 6.93 01:22.0;  Surgeon: Lockie Mola, MD;  Location: [facility];  Service: Ophthalmology;  Laterality: Left;  Diabetic   LOWER EXTREMITY ANGIOGRAPHY Right 05/14/2021   Procedure: LOWER  EXTREMITY ANGIOGRAPHY;  Surgeon: Annice Needy, MD;  Location: [facility] IN[facility];  Service: Cardiovascular;  Laterality: Right;   LOWER EXTREMITY ANGIOGRAPHY Right 06/14/2021   Procedure: Lower Extremity Angiography;  Surgeon: Annice Needy, MD;  Location: [facility] INVASIVE CV LAB;  Service: Cardiovascular;  Laterality: Right;   LOWER EXTREMITY INTERVENTION Right 06/15/2021   Procedure: LOWER EXTREMITY INTERVENTION;  Surgeon: Annice Needy, MD;  Location: [facility] INVAS[facility] CV LAB;  Service: Cardiovascular;  Laterality: Right;   TONSILLECTOMY     had removed as a child     Social History   Tobacco Use   Smoking status: Never   Smokeless tobacco: Never  Vaping Use   Vaping Use: Never used  Substance Use Topics   Alcohol use: Yes    Alcohol/week: 21.0 standard drinks of alcohol    Types: 7 Glasses of wine, 14 Cans of beer per week    Comment: occassional   Drug use: No      Family History  Problem Relation Age of Onset   Scoliosis Mother    Heart disease Father      No Known Allergies   REVIEW OF SYSTEMS (Negative unless checked)   Constitutional: [] Weight loss  [] Fever  [] Chills Cardiac: [] Chest pain   [] Chest pressure   [] Palpitations   [] Shortness of breath when laying flat   [] Shortness of breath at rest   [] Shortness of breath with exertion. Vascular:  [x] Pain in legs with walking   [] Pain in legs at rest   [] Pain in legs when laying flat   [] Claudication   [] Pain in feet when walking  [x] Pain in feet at rest  [] Pain in feet when laying flat   [] History of DVT   [] Phlebitis   [] Swelling in legs   [] Varicose veins   [x] Non-healing ulcers Pulmonary:   [] Uses home oxygen   [] Productive cough   [] Hemoptysis   [] Wheeze  [] COPD   [] Asthma Neurologic:  [] Dizziness  [] Blackouts   [] Seizures   [] History of stroke   [x] History of TIA  [] Aphasia   [] Temporary blindness   [] Dysphagia   [] Weakness or numbness in arms   [] Weakness or numbness in legs Musculoskeletal:  [x] Arthritis   [] Joint  swelling   [x] Joint pain   [] Low back pain Hematologic:  [] Easy bruising  [] Easy bleeding   [] Hypercoagulable state   [] Anemic  [] Hepatitis Gastrointestinal:  [] Blood in stool   [] Vomiting blood  [] Gastroesophageal reflux/heartburn   [] Abdominal pain Genitourinary:  [] Chronic kidney disease   [] Difficult urination  [] Frequent urination  [] Burning with urination   [] Hematuria Skin:  [] Rashes   [x] Ulcers   [x] Wounds Psychological:  [] History of anxiety   []  History of major depression.  Physical Examination  BP 109/69 (BP  Location: Left Arm)   Pulse 72   Resp 18   Ht  (1.727 m)   Wt 195 lb (88.5 kg)   BMI 29.65 kg/m  Gen:  WD/WN, NAD Head: [place]/AT, No temporalis wasting. Ear/Nose/Throat: Hearing grossly intact, nares w/o erythema or drainage Eyes: Conjunctiva clear. Sclera non-icteric Neck: Supple.  Trachea midline Pulmonary:  Good air movement, no use of accessory muscles.  Cardiac: RRR, no JVD Vascular:  Vessel Right Left  Radial Palpable Palpable                          PT 1+ Palpable 1+ Palpable  DP 1+ Palpable 1+ Palpable   Gastrointestinal: soft, non-tender/non-distended. No guarding/reflex.  Musculoskeletal: M/S 5/5 throughout.  No deformity or atrophy.  Significant stasis dermatitis changes present bilaterally.  Mild bilateral lower extremity edema. Neurologic: Sensation grossly intact in extremities.  Symmetrical.  Speech is fluent.  Psychiatric: Judgment intact, Mood & affect appropriate for pt's clinical situation. Dermatologic: No rashes or ulcers noted.  No cellulitis or open wounds.      Labs Recent Results (from the past 2160 hour(s))  VAS [place] ABI WITH/WO TBI     Status: None   Collection Time: 06/07/22  3:44 PM  Result Value Ref Range   Right ABI .81    Left ABI .90     Radiology VAS [place] LOWER EXTREMITY VENOUS REFLUX  Result Date: 07/29/2022  Lower Venous Reflux Study Patient Name:  Manuel [name]  Date of Exam:   07/26/2022 Medical Rec #:  [id number]         Accession #:    [id number] Date of Birth: 03-31-1946         Patient Gender: M Patient Age:   12 years Exam Location:  [facility] Procedure:      VAS [place] LOWER EXTREMITY VENOUS REFLUX Referring Phys: Sheppard Plumber --------------------------------------------------------------------------------  Indications: Pain.  Performing Technologist: Salvadore Farber RVT  Examination Guidelines: A complete evaluation includes B-mode imaging, spectral Doppler, color Doppler, and power Doppler as needed of all accessible portions of each vessel. Bilateral testing is considered an integral part of a complete examination. Limited examinations for reoccurring indications may be performed as noted. The reflux portion of the exam is performed with the patient in reverse Trendelenburg. Significant venous reflux is defined as >500 ms in the superficial venous system, and >1 second in the deep venous system.  +----+---------+------+-----------+------------+--------+ LEFTReflux NoRefluxReflux TimeDiameter cmsComments               Yes                                  +----+---------+------+-----------+------------+--------+ CFV           yes   >1 second                      +----+---------+------+-----------+------------+--------+   Summary: Left: - No evidence of deep vein thrombosis seen in the left lower extremity, from the common femoral through the popliteal veins. - No evidence of superficial venous thrombosis in the left lower extremity. - No evidence of superficial venous reflux seen in the left greater saphenous vein. - No evidence of superficial venous reflux seen in the left short saphenous vein. - Venous reflux is noted in the left common femoral vein.  *See table(s) above for measurements and observations. Electronically signed  by Festus Barren MD on 07/29/2022 at 9:07:00 AM.    Final     Assessment/Plan Benign essential hypertension blood pressure control important in reducing the  progression of atherosclerotic disease. On appropriate oral medications.     Diabetes mellitus type 2, insulin dependent (HCC) blood glucose control important in reducing the progression of atherosclerotic disease. Also, involved in wound healing. On appropriate medications.     Mixed hyperlipidemia lipid control important in reducing the progression of atherosclerotic disease. Resume statin therapy  Atherosclerosis of native arteries of the extremities with ulceration (HCC) His wounds are now healed.  His perfusion was stable on last check a few weeks back.  He is getting over his sinus issues and no interventions planned currently.  Return to clinic in 6 months with noninvasive studies.  Continue Plavix and statin agent    Festus Barren, MD  08/13/2022 10:49 AM    This note was created with Dragon medical transcription system.  Any errors from dictation are purely unintentional

## 2022-08-21 ENCOUNTER — Other Ambulatory Visit: Payer: Self-pay

## 2022-08-21 ENCOUNTER — Encounter: Payer: Self-pay | Admitting: Intensive Care

## 2022-08-21 ENCOUNTER — Emergency Department
Admission: EM | Admit: 2022-08-21 | Discharge: 2022-08-21 | Disposition: A | Discharge status: Home / Self Care | Payer: Medicare Other | Attending: Emergency Medicine | Admitting: Emergency Medicine

## 2022-08-21 DIAGNOSIS — D72829 Elevated white blood cell count, unspecified: Secondary | ICD-10-CM | POA: Insufficient documentation

## 2022-08-21 DIAGNOSIS — Z794 Long term (current) use of insulin: Secondary | ICD-10-CM | POA: Diagnosis not present

## 2022-08-21 DIAGNOSIS — E1165 Type 2 diabetes mellitus with hyperglycemia: Secondary | ICD-10-CM | POA: Diagnosis not present

## 2022-08-21 DIAGNOSIS — I2510 Atherosclerotic heart disease of native coronary artery without angina pectoris: Secondary | ICD-10-CM | POA: Diagnosis not present

## 2022-08-21 DIAGNOSIS — I10 Essential (primary) hypertension: Secondary | ICD-10-CM | POA: Insufficient documentation

## 2022-08-21 DIAGNOSIS — R739 Hyperglycemia, unspecified: Secondary | ICD-10-CM

## 2022-08-21 DIAGNOSIS — R55 Syncope and collapse: Secondary | ICD-10-CM | POA: Diagnosis present

## 2022-08-21 HISTORY — DX: Gout, unspecified: M10.9

## 2022-08-21 LAB — CBC
HCT: 45 % (ref 39.0–52.0)
Hemoglobin: 16.5 g/dL (ref 13.0–17.0)
MCH: 30.5 pg (ref 26.0–34.0)
MCHC: 36.7 g/dL — ABNORMAL HIGH (ref 30.0–36.0)
MCV: 83.2 fL (ref 80.0–100.0)
Platelets: 230 10*3/uL (ref 150–400)
RBC: 5.41 MIL/uL (ref 4.22–5.81)
RDW: 13.1 % (ref 11.5–15.5)
WBC: 11.5 10*3/uL — ABNORMAL HIGH (ref 4.0–10.5)
nRBC: 0 % (ref 0.0–0.2)

## 2022-08-21 LAB — BASIC METABOLIC PANEL
Anion gap: 11 (ref 5–15)
BUN: 26 mg/dL — ABNORMAL HIGH (ref 8–23)
CO2: 25 mmol/L (ref 22–32)
Calcium: 8.9 mg/dL (ref 8.9–10.3)
Chloride: 93 mmol/L — ABNORMAL LOW (ref 98–111)
Creatinine, Ser: 1.14 mg/dL (ref 0.61–1.24)
GFR, Estimated: >60 mL/min (ref >60)
Glucose, Bld: 398 mg/dL — ABNORMAL HIGH (ref 70–99)
Potassium: 3.9 mmol/L (ref 3.5–5.1)
Sodium: 129 mmol/L — ABNORMAL LOW (ref 135–145)

## 2022-08-21 LAB — CBG MONITORING, ED: Glucose-Capillary: 279 mg/dL — ABNORMAL HIGH (ref 70–99)

## 2022-08-21 MED ORDER — LACTATED RINGERS IV BOLUS
1000.0000 mL | Freq: Once | INTRAVENOUS | Status: AC
  Administered 2022-08-21: 1000 mL via INTRAVENOUS

## 2022-08-21 MED ORDER — INSULIN ASPART 100 UNIT/ML IJ SOLN
8.0000 [IU] | Freq: Once | INTRAMUSCULAR | Status: AC
  Administered 2022-08-21: 8 [IU] via SUBCUTANEOUS
  Filled 2022-08-21: qty 1

## 2022-08-21 NOTE — ED Provider Notes (Signed)
[facility] Provider Note    Event Date/Time   First MD Initiated Contact with Patient 08/21/22 1130     (approximate)   History   Near Syncope   HPI  Manuel [name] is a 77 y.o. male  with pmh DM, HTN, CAD, PVD who presents with lightheadedness.  Patient tells me that this morning he was going to ENT clinic.  He drove himself normally got out of the car to walk and he felt very lightheaded like his vision was going blurry just felt generally unwell.  He is currently finishing a course of cefdinir and prednisone for sinusitis.  This is his fourth round of antibiotics since January for sinusitis which is why he is seeing ear nose and throat.  Denies headache numbness tingling weakness.  Does feel like his vision is still somewhat blurred.  Denies chest pain shortness of breath abdominal pain nausea vomiting or diarrhea.  He takes 70/30 insulin and is only been taking it once per day because he is to do lazy he says to take it twice a day.     Past Medical History:  Diagnosis Date   Allergies    Arthritis    Diabetes mellitus without complication (HCC)    Gout    Hard of hearing    Hyperlipidemia    Hypertension    Myocardial infarction [facility]) 2008   treated here at [facility] and transferred to [place] for stent placement   Neuropathy    knees down   Peripheral vascular disease (HCC)    Sleep apnea    "in the past"   Status post primary angioplasty with coronary stent    Wears dentures    full upper and lower    Patient Active Problem List   Diagnosis Date Noted   Left flank pain 06/16/2021   Constipation 06/16/2021   Foot ulcer, right (HCC) 06/14/2021   Lower limb ischemia right leg with history of angioplasty and stent on 05/14/2021 06/13/2021   Alcohol abuse 06/07/2021   Coronary artery disease with history of myocardial infarction without history of CABG 05/08/2021   S/P angioplasty with stent 05/08/2021   Atherosclerosis of native arteries of  the extremities with ulceration (HCC) 05/08/2021   Depression, 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   Diabetic polyneuropathy associated with type 2 diabetes mellitus (HCC) 09/25/2017   Lumbar radiculopathy 01/27/2017   Lumbar degenerative disc disease 01/27/2017   Lumbar spondylosis 01/27/2017   Lumbar facet arthropathy 01/27/2017   Diabetes mellitus type 2, insulin dependent (HCC) 10/18/2014   Hypertension 10/05/2014   Bilateral carotid artery stenosis 04/05/2014   Microalbuminuria 12/19/2013   Gout 09/21/2013   Mixed hyperlipidemia 09/14/2013     Physical Exam  Triage Vital Signs: ED Triage Vitals  Enc Vitals Group     BP 08/21/22 1059 90/64     Pulse Rate 08/21/22 1059 71     Resp 08/21/22 1059 18     Temp 08/21/22 1059 98 F (36.7 C)     Temp Source 08/21/22 1059 Oral     SpO2 08/21/22 1059 99 %     Weight 08/21/22 1047 195 lb (88.5 kg)     Height 08/21/22 1047 5\' 8"  (1.727 m)     Head Circumference --      Peak Flow --      Pain Score 08/21/22 1046 0     Pain Loc --      Pain Edu? --      Excl. in GC? --  Most recent vital signs: Vitals:   08/21/22 1400 08/21/22 1523  BP: (!) 159/85   Pulse: 72   Resp: 16   Temp:  97.8 F (36.6 C)  SpO2: 99%      General: Awake, no distress.  CV:  Good peripheral perfusion.  Resp:  Normal effort.  Abd:  No distention. Soft,  nontender Neuro:             Awake, Alert, Oriented x 3  Other:  Dry MM   ED Results / Procedures / Treatments  Labs (all labs ordered are listed, but only abnormal results are displayed) Labs Reviewed  CBC - Abnormal; Notable for the following components:      Result Value   WBC 11.5 (*)    MCHC 36.7 (*)    All other components within normal limits  BASIC METABOLIC PANEL - Abnormal; Notable for the following components:   Sodium 129 (*)    Chloride 93 (*)    Glucose, Bld 398 (*)    BUN 26 (*)    All other components within normal limits  CBG MONITORING, ED - Abnormal; Notable for the following  components:   Glucose-Capillary 279 (*)    All other components within normal limits  URINALYSIS, ROUTINE W REFLEX MICROSCOPIC  CBG MONITORING, ED     EKG  EKG interpretation performed by myself: NSR, nml axis, nml intervals, no acute ischemic changes, inferior q waves     RADIOLOGY    PROCEDURES:  Critical Care performed: No  Procedures  The patient is on the cardiac monitor to evaluate for evidence of arrhythmia and/or significant heart rate changes.   MEDICATIONS ORDERED IN ED: Medications  lactated ringers bolus 1,000 mL (1,000 mLs Intravenous New Bag/Given 08/21/22 1314)  insulin aspart (novoLOG) injection 8 Units (8 Units Subcutaneous Given 08/21/22 1426)     IMPRESSION / MDM / ASSESSMENT AND PLAN / ED COURSE  I reviewed the triage vital signs and the nursing notes.                              Patient's presentation is most consistent with acute complicated illness / injury requiring diagnostic workup.  Differential diagnosis includes, but is not limited to, vasovagal episode, orthostatic hypotension, hypoglycemia, AKI, low suspicion for ACS  The patient is a 77 year old male with recurrent sinusitis diabetes is poorly controlled who presents today because of lightheadedness and feeling generally unwell.  He was in his normal state of health was going to ENT exam today when he got out of the car and walked to the office and felt very lightheaded like his vision was going black.  He did feel better after sitting down.  He denies associated chest pain or dyspnea.  He has had ongoing issue with recurrent sinusitis and is currently on prednisone and cefdinir which he is finishing up.  Does endorse some ongoing blurred vision but denies double vision numbness tingling weakness or headache.  Patient's initial BP is low at 90/50 however this does improve to elevated blood pressure 175/74 prior to intervention.  On exam he overall looks well mucous membranes are somewhat dry.   Abdominal exam is benign and he has really no acute complaints at this time.  EKG shows inferior Q waves and a biphasic T wave in aVL but patient is not having any chest pain and I have low suspicion for ACS at this time.  Plan to rehydrate fluid.  Will check basic labs.  Patient's labs are noted for blood sugar about 400 but no evidence of DKA.  He has mild leukocytosis which is nonspecific.  Will give subcu insulin.  Patient's blood sugar is 279 on recheck.  Continues to endorse no complaints.  He is appropriate to be discharged.  Discussed the importance of insulin compliance maintaining low-carb diet and staying hydrated.  Recommend he follow-up with PCP for diabetes management.      FINAL CLINICAL IMPRESSION(S) / ED DIAGNOSES   Final diagnoses:  Near syncope  Hyperglycemia     Rx / DC Orders   ED Discharge Orders     None        Note:  This document was prepared using Dragon voice recognition software and may include unintentional dictation errors.   Georga Hacking, MD 08/21/22 [phone]

## 2022-08-21 NOTE — Telephone Encounter (Signed)
Error

## 2022-08-21 NOTE — ED Triage Notes (Signed)
First nurse note: Brought in by EMS from Ear,nose, and throat for near syncopal episode.   History hypertension, neuropathy and diabetes  EMS vitals: CBG 428- non compliant with diabetes  125/70 b/p

## 2022-08-21 NOTE — Discharge Instructions (Signed)
You were hyperglycemic today.  Please make sure you are taking your insulin.  Please drink plenty of water but avoid carbohydrates.

## 2022-09-18 ENCOUNTER — Other Ambulatory Visit (INDEPENDENT_AMBULATORY_CARE_PROVIDER_SITE_OTHER): Payer: Self-pay

## 2022-09-18 ENCOUNTER — Telehealth (INDEPENDENT_AMBULATORY_CARE_PROVIDER_SITE_OTHER): Payer: Self-pay | Admitting: Nurse Practitioner

## 2022-09-18 MED ORDER — CLOPIDOGREL BISULFATE 75 MG PO TABS: 75.0000 mg | ORAL_TABLET | Freq: Every day | ORAL | 1 refills | Status: DC

## 2022-09-18 NOTE — Telephone Encounter (Signed)
Patient called in requesting refill on medication   clopidogrel (PLAVIX) 75 MG tablet    [id number] - [place], [place] - [address]

## 2022-11-10 ENCOUNTER — Other Ambulatory Visit: Payer: Self-pay

## 2022-11-10 ENCOUNTER — Emergency Department: Payer: Medicare Other

## 2022-11-10 ENCOUNTER — Inpatient Hospital Stay
Admission: EM | Admit: 2022-11-10 | Discharge: 2022-11-26 | DRG: 239 | Disposition: A | Discharge status: Home Health | Payer: Medicare Other | Attending: Internal Medicine | Admitting: Internal Medicine

## 2022-11-10 DIAGNOSIS — R0902 Hypoxemia: Secondary | ICD-10-CM | POA: Diagnosis not present

## 2022-11-10 DIAGNOSIS — M79604 Pain in right leg: Secondary | ICD-10-CM | POA: Diagnosis not present

## 2022-11-10 DIAGNOSIS — I110 Hypertensive heart disease with heart failure: Secondary | ICD-10-CM | POA: Diagnosis present

## 2022-11-10 DIAGNOSIS — M109 Gout, unspecified: Secondary | ICD-10-CM | POA: Diagnosis present

## 2022-11-10 DIAGNOSIS — E11621 Type 2 diabetes mellitus with foot ulcer: Secondary | ICD-10-CM | POA: Diagnosis present

## 2022-11-10 DIAGNOSIS — R54 Age-related physical debility: Secondary | ICD-10-CM | POA: Diagnosis present

## 2022-11-10 DIAGNOSIS — Z66 Do not resuscitate: Secondary | ICD-10-CM | POA: Diagnosis present

## 2022-11-10 DIAGNOSIS — I70202 Unspecified atherosclerosis of native arteries of extremities, left leg: Secondary | ICD-10-CM | POA: Diagnosis not present

## 2022-11-10 DIAGNOSIS — Z6839 Body mass index (BMI) 39.0-39.9, adult: Secondary | ICD-10-CM | POA: Diagnosis not present

## 2022-11-10 DIAGNOSIS — Z7902 Long term (current) use of antithrombotics/antiplatelets: Secondary | ICD-10-CM

## 2022-11-10 DIAGNOSIS — Z7982 Long term (current) use of aspirin: Secondary | ICD-10-CM

## 2022-11-10 DIAGNOSIS — I10 Essential (primary) hypertension: Secondary | ICD-10-CM | POA: Diagnosis not present

## 2022-11-10 DIAGNOSIS — I998 Other disorder of circulatory system: Principal | ICD-10-CM | POA: Diagnosis present

## 2022-11-10 DIAGNOSIS — Z23 Encounter for immunization: Secondary | ICD-10-CM | POA: Diagnosis not present

## 2022-11-10 DIAGNOSIS — I743 Embolism and thrombosis of arteries of the lower extremities: Secondary | ICD-10-CM | POA: Diagnosis present

## 2022-11-10 DIAGNOSIS — T82868A Thrombosis of vascular prosthetic devices, implants and grafts, initial encounter: Secondary | ICD-10-CM | POA: Diagnosis not present

## 2022-11-10 DIAGNOSIS — E119 Type 2 diabetes mellitus without complications: Secondary | ICD-10-CM | POA: Diagnosis not present

## 2022-11-10 DIAGNOSIS — I5042 Chronic combined systolic (congestive) and diastolic (congestive) heart failure: Secondary | ICD-10-CM | POA: Insufficient documentation

## 2022-11-10 DIAGNOSIS — L03115 Cellulitis of right lower limb: Secondary | ICD-10-CM | POA: Diagnosis present

## 2022-11-10 DIAGNOSIS — I5043 Acute on chronic combined systolic (congestive) and diastolic (congestive) heart failure: Secondary | ICD-10-CM | POA: Diagnosis present

## 2022-11-10 DIAGNOSIS — L97519 Non-pressure chronic ulcer of other part of right foot with unspecified severity: Secondary | ICD-10-CM | POA: Diagnosis present

## 2022-11-10 DIAGNOSIS — E1152 Type 2 diabetes mellitus with diabetic peripheral angiopathy with gangrene: Secondary | ICD-10-CM | POA: Diagnosis not present

## 2022-11-10 DIAGNOSIS — L03119 Cellulitis of unspecified part of limb: Secondary | ICD-10-CM | POA: Diagnosis not present

## 2022-11-10 DIAGNOSIS — Z9862 Peripheral vascular angioplasty status: Secondary | ICD-10-CM | POA: Diagnosis not present

## 2022-11-10 DIAGNOSIS — M79605 Pain in left leg: Secondary | ICD-10-CM | POA: Diagnosis not present

## 2022-11-10 DIAGNOSIS — Z9842 Cataract extraction status, left eye: Secondary | ICD-10-CM

## 2022-11-10 DIAGNOSIS — I252 Old myocardial infarction: Secondary | ICD-10-CM | POA: Diagnosis not present

## 2022-11-10 DIAGNOSIS — E1142 Type 2 diabetes mellitus with diabetic polyneuropathy: Secondary | ICD-10-CM | POA: Diagnosis present

## 2022-11-10 DIAGNOSIS — M79672 Pain in left foot: Secondary | ICD-10-CM | POA: Diagnosis not present

## 2022-11-10 DIAGNOSIS — I700 Atherosclerosis of aorta: Secondary | ICD-10-CM | POA: Diagnosis present

## 2022-11-10 DIAGNOSIS — I5023 Acute on chronic systolic (congestive) heart failure: Secondary | ICD-10-CM | POA: Insufficient documentation

## 2022-11-10 DIAGNOSIS — H9190 Unspecified hearing loss, unspecified ear: Secondary | ICD-10-CM | POA: Diagnosis present

## 2022-11-10 DIAGNOSIS — I70261 Atherosclerosis of native arteries of extremities with gangrene, right leg: Secondary | ICD-10-CM | POA: Diagnosis present

## 2022-11-10 DIAGNOSIS — I708 Atherosclerosis of other arteries: Secondary | ICD-10-CM | POA: Diagnosis not present

## 2022-11-10 DIAGNOSIS — E669 Obesity, unspecified: Secondary | ICD-10-CM | POA: Diagnosis present

## 2022-11-10 DIAGNOSIS — I5022 Chronic systolic (congestive) heart failure: Secondary | ICD-10-CM | POA: Insufficient documentation

## 2022-11-10 DIAGNOSIS — K5900 Constipation, unspecified: Secondary | ICD-10-CM | POA: Diagnosis present

## 2022-11-10 DIAGNOSIS — F4321 Adjustment disorder with depressed mood: Secondary | ICD-10-CM | POA: Diagnosis present

## 2022-11-10 DIAGNOSIS — N179 Acute kidney failure, unspecified: Secondary | ICD-10-CM | POA: Diagnosis present

## 2022-11-10 DIAGNOSIS — E782 Mixed hyperlipidemia: Secondary | ICD-10-CM | POA: Diagnosis present

## 2022-11-10 DIAGNOSIS — Z7901 Long term (current) use of anticoagulants: Secondary | ICD-10-CM

## 2022-11-10 DIAGNOSIS — Z955 Presence of coronary angioplasty implant and graft: Secondary | ICD-10-CM

## 2022-11-10 DIAGNOSIS — I96 Gangrene, not elsewhere classified: Secondary | ICD-10-CM

## 2022-11-10 DIAGNOSIS — F1010 Alcohol abuse, uncomplicated: Secondary | ICD-10-CM | POA: Diagnosis present

## 2022-11-10 DIAGNOSIS — G4730 Sleep apnea, unspecified: Secondary | ICD-10-CM | POA: Diagnosis present

## 2022-11-10 DIAGNOSIS — D62 Acute posthemorrhagic anemia: Secondary | ICD-10-CM | POA: Diagnosis not present

## 2022-11-10 DIAGNOSIS — N5089 Other specified disorders of the male genital organs: Secondary | ICD-10-CM | POA: Diagnosis present

## 2022-11-10 DIAGNOSIS — Z961 Presence of intraocular lens: Secondary | ICD-10-CM | POA: Diagnosis present

## 2022-11-10 DIAGNOSIS — K5903 Drug induced constipation: Secondary | ICD-10-CM | POA: Diagnosis present

## 2022-11-10 DIAGNOSIS — E871 Hypo-osmolality and hyponatremia: Secondary | ICD-10-CM | POA: Diagnosis present

## 2022-11-10 DIAGNOSIS — Z9841 Cataract extraction status, right eye: Secondary | ICD-10-CM

## 2022-11-10 DIAGNOSIS — R7982 Elevated C-reactive protein (CRP): Secondary | ICD-10-CM | POA: Diagnosis not present

## 2022-11-10 DIAGNOSIS — L97909 Non-pressure chronic ulcer of unspecified part of unspecified lower leg with unspecified severity: Secondary | ICD-10-CM | POA: Diagnosis present

## 2022-11-10 DIAGNOSIS — I739 Peripheral vascular disease, unspecified: Secondary | ICD-10-CM

## 2022-11-10 DIAGNOSIS — Z794 Long term (current) use of insulin: Secondary | ICD-10-CM | POA: Diagnosis not present

## 2022-11-10 DIAGNOSIS — E1169 Type 2 diabetes mellitus with other specified complication: Secondary | ICD-10-CM | POA: Diagnosis not present

## 2022-11-10 DIAGNOSIS — Z8249 Family history of ischemic heart disease and other diseases of the circulatory system: Secondary | ICD-10-CM

## 2022-11-10 DIAGNOSIS — Z79899 Other long term (current) drug therapy: Secondary | ICD-10-CM

## 2022-11-10 DIAGNOSIS — Z951 Presence of aortocoronary bypass graft: Secondary | ICD-10-CM

## 2022-11-10 DIAGNOSIS — T40605A Adverse effect of unspecified narcotics, initial encounter: Secondary | ICD-10-CM | POA: Diagnosis present

## 2022-11-10 DIAGNOSIS — Z7189 Other specified counseling: Secondary | ICD-10-CM | POA: Diagnosis not present

## 2022-11-10 DIAGNOSIS — Z95828 Presence of other vascular implants and grafts: Secondary | ICD-10-CM | POA: Diagnosis not present

## 2022-11-10 DIAGNOSIS — I2510 Atherosclerotic heart disease of native coronary artery without angina pectoris: Secondary | ICD-10-CM | POA: Diagnosis not present

## 2022-11-10 DIAGNOSIS — I7772 Dissection of iliac artery: Secondary | ICD-10-CM | POA: Diagnosis not present

## 2022-11-10 LAB — SEDIMENTATION RATE: Sed Rate: 17 mm/hr (ref 0–20)

## 2022-11-10 LAB — CBC WITH DIFFERENTIAL/PLATELET
Abs Immature Granulocytes: 0.04 10*3/uL (ref 0.00–0.07)
Basophils Absolute: 0.1 10*3/uL (ref 0.0–0.1)
Basophils Relative: 0 %
Eosinophils Absolute: 0.2 10*3/uL (ref 0.0–0.5)
Eosinophils Relative: 2 %
HCT: 43.5 % (ref 39.0–52.0)
Hemoglobin: 15.2 g/dL (ref 13.0–17.0)
Immature Granulocytes: 0 %
Lymphocytes Relative: 16 %
Lymphs Abs: 1.8 10*3/uL (ref 0.7–4.0)
MCH: 30.7 pg (ref 26.0–34.0)
MCHC: 34.9 g/dL (ref 30.0–36.0)
MCV: 87.9 fL (ref 80.0–100.0)
Monocytes Absolute: 1.1 10*3/uL — ABNORMAL HIGH (ref 0.1–1.0)
Monocytes Relative: 10 %
Neutro Abs: 8 10*3/uL — ABNORMAL HIGH (ref 1.7–7.7)
Neutrophils Relative %: 72 %
Platelets: 303 10*3/uL (ref 150–400)
RBC: 4.95 MIL/uL (ref 4.22–5.81)
RDW: 13.3 % (ref 11.5–15.5)
WBC: 11.2 10*3/uL — ABNORMAL HIGH (ref 4.0–10.5)
nRBC: 0 % (ref 0.0–0.2)

## 2022-11-10 LAB — PROTIME-INR
INR: 1.1 (ref 0.8–1.2)
Prothrombin Time: 14.1 seconds (ref 11.4–15.2)

## 2022-11-10 LAB — COMPREHENSIVE METABOLIC PANEL
ALT: 21 U/L (ref 0–44)
AST: 27 U/L (ref 15–41)
Albumin: 4 g/dL (ref 3.5–5.0)
Alkaline Phosphatase: 70 U/L (ref 38–126)
Anion gap: 11 (ref 5–15)
BUN: 19 mg/dL (ref 8–23)
CO2: 25 mmol/L (ref 22–32)
Calcium: 9.2 mg/dL (ref 8.9–10.3)
Chloride: 96 mmol/L — ABNORMAL LOW (ref 98–111)
Creatinine, Ser: 0.9 mg/dL (ref 0.61–1.24)
GFR, Estimated: >60 mL/min (ref >60)
Glucose, Bld: 121 mg/dL — ABNORMAL HIGH (ref 70–99)
Potassium: 4.6 mmol/L (ref 3.5–5.1)
Sodium: 132 mmol/L — ABNORMAL LOW (ref 135–145)
Total Bilirubin: 1.5 mg/dL — ABNORMAL HIGH (ref 0.3–1.2)
Total Protein: 8 g/dL (ref 6.5–8.1)

## 2022-11-10 LAB — LACTIC ACID, PLASMA
Lactic Acid, Venous: 0.9 mmol/L (ref 0.5–1.9)
Lactic Acid, Venous: 1.2 mmol/L (ref 0.5–1.9)

## 2022-11-10 LAB — HEPARIN LEVEL (UNFRACTIONATED): Heparin Unfractionated: 0.12 IU/mL — ABNORMAL LOW (ref 0.30–0.70)

## 2022-11-10 LAB — SURGICAL PCR SCREEN
MRSA, PCR: NEGATIVE
Staphylococcus aureus: POSITIVE — AB

## 2022-11-10 LAB — GLUCOSE, CAPILLARY
Glucose-Capillary: 68 mg/dL — ABNORMAL LOW (ref 70–99)
Glucose-Capillary: 93 mg/dL (ref 70–99)
Glucose-Capillary: 151 mg/dL — ABNORMAL HIGH (ref 70–99)

## 2022-11-10 LAB — C-REACTIVE PROTEIN: CRP: 10.1 mg/dL — ABNORMAL HIGH (ref <1.0)

## 2022-11-10 LAB — PREALBUMIN: Prealbumin: 15 mg/dL — ABNORMAL LOW (ref 18–38)

## 2022-11-10 LAB — APTT: aPTT: 32 seconds (ref 24–36)

## 2022-11-10 MED ORDER — PIPERACILLIN-TAZOBACTAM 3.375 G IVPB 30 MIN
3.3750 g | Freq: Once | INTRAVENOUS | Status: AC
  Administered 2022-11-10: 3.375 g via INTRAVENOUS
  Filled 2022-11-10: qty 50

## 2022-11-10 MED ORDER — MORPHINE SULFATE (PF) 2 MG/ML IV SOLN
2.0000 mg | INTRAVENOUS | Status: DC | PRN
  Administered 2022-11-11 – 2022-11-19 (×8): 2 mg via INTRAVENOUS
  Filled 2022-11-10 (×8): qty 1

## 2022-11-10 MED ORDER — VANCOMYCIN HCL 1750 MG/350ML IV SOLN
1750.0000 mg | INTRAVENOUS | Status: DC
  Administered 2022-11-11 – 2022-11-13 (×3): 1750 mg via INTRAVENOUS
  Filled 2022-11-10 (×3): qty 350

## 2022-11-10 MED ORDER — MORPHINE SULFATE (PF) 4 MG/ML IV SOLN
4.0000 mg | Freq: Once | INTRAVENOUS | Status: AC
  Administered 2022-11-10: 4 mg via INTRAVENOUS
  Filled 2022-11-10: qty 1

## 2022-11-10 MED ORDER — HEPARIN BOLUS VIA INFUSION
5000.0000 [IU] | Freq: Once | INTRAVENOUS | Status: AC
  Administered 2022-11-10: 5000 [IU] via INTRAVENOUS
  Filled 2022-11-10: qty 5000

## 2022-11-10 MED ORDER — VANCOMYCIN HCL 2000 MG/400ML IV SOLN
2000.0000 mg | Freq: Once | INTRAVENOUS | Status: AC
  Administered 2022-11-10: 2000 mg via INTRAVENOUS
  Filled 2022-11-10: qty 400

## 2022-11-10 MED ORDER — SODIUM CHLORIDE 0.9 % IV SOLN: INTRAVENOUS | Status: DC | PRN

## 2022-11-10 MED ORDER — METRONIDAZOLE 500 MG/100ML IV SOLN
500.0000 mg | Freq: Two times a day (BID) | INTRAVENOUS | Status: DC
  Administered 2022-11-10 – 2022-11-13 (×7): 500 mg via INTRAVENOUS
  Filled 2022-11-10 (×7): qty 100

## 2022-11-10 MED ORDER — OXYCODONE HCL 5 MG PO TABS
10.0000 mg | ORAL_TABLET | ORAL | Status: DC | PRN
  Administered 2022-11-10 – 2022-11-17 (×13): 10 mg via ORAL
  Filled 2022-11-10 (×14): qty 2

## 2022-11-10 MED ORDER — THIAMINE MONONITRATE 100 MG PO TABS
100.0000 mg | ORAL_TABLET | Freq: Every day | ORAL | Status: DC
  Administered 2022-11-10 – 2022-11-22 (×10): 100 mg via ORAL
  Filled 2022-11-10 (×12): qty 1

## 2022-11-10 MED ORDER — PNEUMOCOCCAL 20-VAL CONJ VACC 0.5 ML IM SUSY
0.5000 mL | PREFILLED_SYRINGE | INTRAMUSCULAR | Status: AC
  Administered 2022-11-11: 0.5 mL via INTRAMUSCULAR
  Filled 2022-11-10: qty 0.5

## 2022-11-10 MED ORDER — INSULIN ASPART 100 UNIT/ML IJ SOLN
0.0000 [IU] | Freq: Three times a day (TID) | INTRAMUSCULAR | Status: DC
  Administered 2022-11-11: 1 [IU] via SUBCUTANEOUS
  Administered 2022-11-12 (×2): 2 [IU] via SUBCUTANEOUS
  Administered 2022-11-12: 1 [IU] via SUBCUTANEOUS
  Administered 2022-11-13: 2 [IU] via SUBCUTANEOUS
  Administered 2022-11-13 (×2): 1 [IU] via SUBCUTANEOUS
  Administered 2022-11-14: 2 [IU] via SUBCUTANEOUS
  Administered 2022-11-14 – 2022-11-16 (×4): 1 [IU] via SUBCUTANEOUS
  Administered 2022-11-16: 3 [IU] via SUBCUTANEOUS
  Administered 2022-11-17 (×2): 2 [IU] via SUBCUTANEOUS
  Administered 2022-11-18 (×2): 1 [IU] via SUBCUTANEOUS
  Administered 2022-11-18: 2 [IU] via SUBCUTANEOUS
  Administered 2022-11-19 (×3): 1 [IU] via SUBCUTANEOUS
  Administered 2022-11-20: 2 [IU] via SUBCUTANEOUS
  Administered 2022-11-20: 1 [IU] via SUBCUTANEOUS
  Administered 2022-11-20 – 2022-11-21 (×2): 2 [IU] via SUBCUTANEOUS
  Administered 2022-11-21: 1 [IU] via SUBCUTANEOUS
  Administered 2022-11-21 – 2022-11-22 (×2): 2 [IU] via SUBCUTANEOUS
  Administered 2022-11-22: 1 [IU] via SUBCUTANEOUS
  Administered 2022-11-22: 3 [IU] via SUBCUTANEOUS
  Administered 2022-11-23 – 2022-11-24 (×4): 2 [IU] via SUBCUTANEOUS
  Administered 2022-11-24: 1 [IU] via SUBCUTANEOUS
  Administered 2022-11-24: 7 [IU] via SUBCUTANEOUS
  Administered 2022-11-25 (×3): 3 [IU] via SUBCUTANEOUS
  Administered 2022-11-26: 2 [IU] via SUBCUTANEOUS
  Administered 2022-11-26: 5 [IU] via SUBCUTANEOUS
  Filled 2022-11-10 (×43): qty 1

## 2022-11-10 MED ORDER — HEPARIN (PORCINE) 25000 UT/250ML-% IV SOLN
2250.0000 [IU]/h | INTRAVENOUS | Status: DC
  Administered 2022-11-10: 1400 [IU]/h via INTRAVENOUS
  Administered 2022-11-11: 1950 [IU]/h via INTRAVENOUS
  Administered 2022-11-11: 1700 [IU]/h via INTRAVENOUS
  Administered 2022-11-12: 2100 [IU]/h via INTRAVENOUS
  Administered 2022-11-12: 1950 [IU]/h via INTRAVENOUS
  Administered 2022-11-13 (×3): 2100 [IU]/h via INTRAVENOUS
  Administered 2022-11-14: 2250 [IU]/h via INTRAVENOUS
  Administered 2022-11-14: 2100 [IU]/h via INTRAVENOUS
  Administered 2022-11-15: 2250 [IU]/h via INTRAVENOUS
  Filled 2022-11-10 (×10): qty 250

## 2022-11-10 MED ORDER — HEPARIN BOLUS VIA INFUSION
2600.0000 [IU] | Freq: Once | INTRAVENOUS | Status: AC
  Administered 2022-11-10: 2600 [IU] via INTRAVENOUS
  Filled 2022-11-10: qty 2600

## 2022-11-10 MED ORDER — ADULT MULTIVITAMIN W/MINERALS CH
1.0000 | ORAL_TABLET | Freq: Every day | ORAL | Status: DC
  Administered 2022-11-10 – 2022-11-21 (×11): 1 via ORAL
  Filled 2022-11-10 (×12): qty 1

## 2022-11-10 MED ORDER — FOLIC ACID 1 MG PO TABS
1.0000 mg | ORAL_TABLET | Freq: Every day | ORAL | Status: DC
  Administered 2022-11-10 – 2022-11-14 (×5): 1 mg via ORAL
  Filled 2022-11-10 (×5): qty 1

## 2022-11-10 MED ORDER — GABAPENTIN 400 MG PO CAPS
400.0000 mg | ORAL_CAPSULE | Freq: Once | ORAL | Status: AC
  Administered 2022-11-10: 400 mg via ORAL
  Filled 2022-11-10: qty 1

## 2022-11-10 MED ORDER — CHLORHEXIDINE GLUCONATE CLOTH 2 % EX PADS
6.0000 | MEDICATED_PAD | Freq: Every day | CUTANEOUS | Status: AC
  Administered 2022-11-10 – 2022-11-13 (×4): 6 via TOPICAL

## 2022-11-10 MED ORDER — LORAZEPAM 1 MG PO TABS
1.0000 mg | ORAL_TABLET | ORAL | Status: AC | PRN
  Administered 2022-11-12 – 2022-11-13 (×3): 1 mg via ORAL
  Filled 2022-11-10 (×3): qty 1

## 2022-11-10 MED ORDER — MUPIROCIN 2 % EX OINT
1.0000 | TOPICAL_OINTMENT | Freq: Two times a day (BID) | CUTANEOUS | Status: AC
  Administered 2022-11-10 – 2022-11-14 (×9): 1 via NASAL
  Filled 2022-11-10: qty 22

## 2022-11-10 MED ORDER — THIAMINE HCL 100 MG/ML IJ SOLN
100.0000 mg | Freq: Every day | INTRAMUSCULAR | Status: DC
  Administered 2022-11-16 – 2022-11-19 (×2): 100 mg via INTRAVENOUS
  Filled 2022-11-10: qty 1
  Filled 2022-11-10 (×2): qty 2

## 2022-11-10 MED ORDER — GABAPENTIN 400 MG PO CAPS
400.0000 mg | ORAL_CAPSULE | Freq: Two times a day (BID) | ORAL | Status: DC
  Administered 2022-11-10 – 2022-11-11 (×3): 400 mg via ORAL
  Filled 2022-11-10 (×3): qty 1

## 2022-11-10 MED ORDER — SODIUM CHLORIDE 0.9 % IV SOLN
2.0000 g | Freq: Three times a day (TID) | INTRAVENOUS | Status: DC
  Administered 2022-11-10 – 2022-11-13 (×9): 2 g via INTRAVENOUS
  Filled 2022-11-10 (×10): qty 12.5

## 2022-11-10 MED ORDER — LORAZEPAM 2 MG/ML IJ SOLN
1.0000 mg | INTRAMUSCULAR | Status: AC | PRN
  Administered 2022-11-10: 2 mg via INTRAVENOUS
  Filled 2022-11-10: qty 1

## 2022-11-10 NOTE — Consult Note (Addendum)
ORTHOPAEDIC CONSULTATION  REQUESTING PHYSICIAN: Pilar Jarvis, MD  Chief Complaint: Necrotic toes right foot  HPI: Manuel [name] is a 77 y.o. male who complains of worsening pain to his right lower extremity.  He states he noticed darkened discoloration to his right fourth toe started up this week.  He denies trauma to the area.  Known history of severe peripheral vascular disease with neuropathy.  History of diabetes.  Pain has become quite intense per the patient.  Past Medical History:  Diagnosis Date   Allergies    Arthritis    Diabetes mellitus without complication (HCC)    Gout    Hard of hearing    Hyperlipidemia    Hypertension    Myocardial infarction [facility]) 2008   treated here at [facility] and transferred to [place] for stent placement   Neuropathy    knees down   Peripheral vascular disease (HCC)    Sleep apnea    "in the past"   Status post primary angioplasty with coronary stent    Wears dentures    full upper and lower   Past Surgical History:  Procedure Laterality Date   CATARACT EXTRACTION W/PHACO Right 03/14/2021   Procedure: CATARACT EXTRACTION PHACO AND INTRAOCULAR LENS PLACEMENT (IOC) RIGHT DIABETIC;  Surgeon: Lockie Mola, MD;  Location: [facility];  Service: Ophthalmology;  Laterality: Right;  Diabetic 16.78 01:39.9   CATARACT EXTRACTION W/PHACO Left 03/28/2021   Procedure: CATARACT EXTRACTION PHACO AND INTRAOCULAR LENS PLACEMENT (IOC) LEFT DIABETIC 6.93 01:22.0;  Surgeon: Lockie Mola, MD;  Location: [facility];  Service: Ophthalmology;  Laterality: Left;  Diabetic   LOWER EXTREMITY ANGIOGRAPHY Right 05/14/2021   Procedure: LOWER EXTREMITY ANGIOGRAPHY;  Surgeon: Annice Needy, MD;  Location: [facility] INVASIVE CV LAB;  Service: Cardiovascular;  Laterality: Right;   LOWER EXTREMITY ANGIOGRAPHY Right 06/14/2021   Procedure: Lower Extremity Angiography;  Surgeon: Annice Needy, MD;  Location: [facility] INVASIVE CV LAB;  Service:  Cardiovascular;  Laterality: Right;   LOWER EXTREMITY INTERVENTION Right 06/15/2021   Procedure: LOWER EXTREMITY INTERVENTION;  Surgeon: Annice Needy, MD;  Location: [facility] INVASIVE CV LAB;  Service: Cardiovascular;  Laterality: Right;   TONSILLECTOMY     had removed as a child   Social History   Socioeconomic History   Marital status: Married    Spouse name: Not on file   Number of children: Not on file   Years of education: Not on file   Highest education level: Not on file  Occupational History   Not on file  Tobacco Use   Smoking status: Never   Smokeless tobacco: Never  Vaping Use   Vaping status: Never Used  Substance and Sexual Activity   Alcohol use: Yes    Alcohol/week: 21.0 standard drinks of alcohol    Types: 7 Glasses of wine, 14 Cans of beer per week    Comment: occassional   Drug use: No   Sexual activity: Not on file  Other Topics Concern   Not on file  Social History Narrative   Not on file   Social Determinants of Health   Financial Resource Strain: Not on file  Food Insecurity: Not on file  Transportation Needs: Not on file  Physical Activity: Not on file  Stress: Not on file  Social Connections: Not on file   Family History  Problem Relation Age of Onset   Scoliosis Mother    Heart disease Father    No Known Allergies Prior to Admission medications  Medication Sig Start Date End Date Taking? Authorizing Provider  amLODipine (NORVASC) 5 MG tablet Take 10 mg by mouth daily.  12/30/16   [provider]  apixaban (ELIQUIS) 5 MG TABS tablet Take 1 tablet (5 mg total) by mouth 2 (two) times daily. Patient not taking: Reported on 01/25/2022 06/17/21   Burnadette Pop, MD  aspirin EC 81 MG tablet Take 1 tablet (81 mg total) by mouth daily. Patient not taking: Reported on 08/13/2022 05/14/21   Annice Needy, MD  atorvastatin (LIPITOR) 80 MG tablet Take 1 tablet (80 mg total) by mouth daily. 06/18/21   Burnadette Pop, MD  carvedilol (COREG) 12.5 MG  tablet Take 1 tablet by mouth 2 (two) times daily. 12/27/16   [provider]  cefdinir (OMNICEF) 300 MG capsule Take 300 mg by mouth 2 (two) times daily. 07/01/22   [provider]  clopidogrel (PLAVIX) 75 MG tablet Take 1 tablet (75 mg total) by mouth daily. 09/18/22   Georgiana Spinner, NP  DULoxetine (CYMBALTA) 30 MG capsule Take 30 mg by mouth daily. Patient not taking: Reported on 07/11/2021    [provider]  fluticasone (FLONASE) 50 MCG/ACT nasal spray Place 2 sprays into both nostrils as needed for allergies or rhinitis. Patient not taking: Reported on 07/11/2021    [provider]  gabapentin (NEURONTIN) 100 MG capsule Take 100 mg by mouth at bedtime.    [provider]  gabapentin (NEURONTIN) 400 MG capsule Take by mouth 2 (two) times daily. 12/06/21   [provider]  Ibuprofen 200 MG CAPS Take 2 tablets by mouth 3 times/day as needed-between meals & bedtime.    [provider]  insulin aspart protamine- aspart (NOVOLOG MIX 70/30) (70-30) 100 UNIT/ML injection Inject 32 Units into the skin 2 (two) times daily with a meal.    [provider]  levocetirizine (XYZAL) 5 MG tablet Take 5 mg by mouth at bedtime. Patient not taking: Reported on 08/13/2022 07/01/22   [provider]  lisinopril (ZESTRIL) 20 MG tablet Take 1 tablet (20 mg total) by mouth daily. 06/17/21 04/18/26  Burnadette Pop, MD  polyethylene glycol (MIRALAX / GLYCOLAX) 17 g packet Take 17 g by mouth daily. Patient not taking: Reported on 08/13/2022 06/18/21   Burnadette Pop, MD  senna-docusate (SENOKOT-S) 8.6-50 MG tablet Take 1 tablet by mouth at bedtime as needed for mild constipation. 06/17/21   Burnadette Pop, MD  zolpidem (AMBIEN) 5 MG tablet Take 1 tablet (5 mg total) by mouth at bedtime as needed for up to 5 doses for sleep. Patient not taking: Reported on 07/11/2021 06/17/21   Burnadette Pop, MD   DG Foot Complete Right  Result Date:  11/10/2022 CLINICAL DATA:  Toe ulcers.  Evaluate for osteomyelitis. EXAM: RIGHT FOOT COMPLETE - 3+ VIEW COMPARISON:  None Available. FINDINGS: There are no signs of acute fracture or dislocation. No focal bone erosions of osteomyelitis identified. Mild hallux valgus deformity with mild degenerative changes at the first MTP joint. Mild dorsal soft tissue edema. IMPRESSION: 1. No acute findings. No signs of osteomyelitis. 2. 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 Electronically Signed   By: Signa Kell M.D.   On: 11/10/2022 10:00    Positive ROS: All other systems have been reviewed and were otherwise negative with the exception of those mentioned in the HPI and as above.  12 point ROS was performed.  Physical Exam: General: Alert and oriented.  No apparent distress.  Vascular:  Left foot:Dorsalis Pedis:  absent Posterior Tibial:  absent  Right foot: Dorsalis Pedis:  absent Posterior Tibial:  absent  None dopplerable DP and PT pulses to bilateral lower extremities  Neuro:absent protective sensation  Derm: Left foot without ulceration or preulcerative lesion.  Mild xerosis.  Right foot with necrotic fourth toe with necrotic ulcer on the distal medial fifth toe.  Drainage noted.  Lymphangitic streaking to the dorsal right foot to the medial leg.  Diffuse erythema to the right foot and ankle.       Ortho/MS: Noted edema to the right lower extremity compared to the left.  Assessment: Gangrenous changes with necrotic fourth toe and early necrosis of the fifth toe Diabetes with peripheral vascular disease  Plan: Patient will need vascular evaluation initially.  Nonpalpable or dopplerable pulses at this time.  He is complaining of rest pain at this time.  We discussed the likelihood that he will have to undergo amputation of the fourth and fifth toes.  Patient expressed understanding.  Podiatry can follow-up after revascularization has been performed.      Irean Hong, [name]    11/10/2022 10:20 AM

## 2022-11-10 NOTE — Assessment & Plan Note (Signed)
SSI

## 2022-11-10 NOTE — ED Triage Notes (Signed)
Pt states coming in with right leg swelling and nacrotic toes to the right foot.Pt states swelling for the last several days. Multiple ulcers noted to the right foot. Pt states it has been getting worse over the last several days. EMS and triage RN's unable to get temperature.  Pt states he is a diabetic.

## 2022-11-10 NOTE — Assessment & Plan Note (Addendum)
Worsening right lower extremity redness swelling and pain over the past 2 weeks in the setting of baseline limb ischemia status post angioplasty and stenting January 2023 Patient reports compliance with Plavix and statin therapy Case reviewed with Dr. [name] with vascular surgery Started on heparin drip in the ER Noted worsening ulcer formation across multiple digits of the foot with concern for gangrene, going for lower extremity angiography and possible angioplasty today -Continue empiric osteomyelitic/cellulitic coverage with cefepime, Flagyl, vancomycin Follow-up podiatry recommendations with Dr. [name] Follow-up postprocedural vascular recommendations

## 2022-11-10 NOTE — Plan of Care (Signed)
  Problem: Education: Goal: Knowledge of General Education information will improve Description Including pain rating scale, medication(s)/side effects and non-pharmacologic comfort measures Outcome: Progressing   

## 2022-11-10 NOTE — Assessment & Plan Note (Signed)
BP stable Titrate home regimen 

## 2022-11-10 NOTE — Assessment & Plan Note (Signed)
3-4 beer daily drinker + mixed drinks  Will place on CIWA protocol  Follow

## 2022-11-10 NOTE — H&P (Signed)
History and Physical    Patient: Manuel [name]:[id number] DOB: 03-Aug-1945 DOA: 11/10/2022 DOS: the patient was seen and examined on 11/10/2022 PCP: Gracelyn Nurse, MD  Patient coming from: Home  Chief Complaint:  Chief Complaint  Patient presents with   Leg Swelling    Right   HPI: Manuel [name] is a 77 y.o. male with medical history significant of  insulin-dependent DM, HTN, CAD, PAD s/p angioplasty with stent 05/14/2021 presenting with right lower extremity ischemia and ulcer formation.  Patient noted to have prior issues with limb ischemia of the same limb noted evaluation January 2023 requiring angioplasty and stent placement.  Patient also noted to have had an additional angioplasty and mechanical thrombectomy done February 2023 of similar area with vascular surgery.  Patient was initially started on Eliquis and aspirin.  Patient reports he took Eliquis for approximately 3 to 4 months with aspirin.  Has been transitioned to Plavix.  Patient states that he has been compliant with Plavix.  Denies any missed doses.  Non-smoker.  Pain has been progressively worsening especially over the past week or so.  Patient states that "he feels like his foot is dying ".  No fevers or chills no nausea vomiting.  No chest pain or shortness of breath.  Does drink at least 3-4 beers daily as well as multiple mixed drinks. Presented to the ER afebrile, hemodynamically stable.  White count 11.2, hemoglobin 15.2, platelets 303, lactate 1.2, creatinine 0.9.  T. bili 1.5.  Plain films of the right foot with no signs of osteomyelitis.  Dr. [name] with vascular surgery initially consulted with recommendation for heparin drip.  Dr. [name] with podiatry also consulted for further evaluation. Review of Systems: As mentioned in the history of present illness. All other systems reviewed and are negative. Past Medical History:  Diagnosis Date   Allergies    Arthritis    Diabetes mellitus without complication (HCC)     Gout    Hard of hearing    Hyperlipidemia    Hypertension    Myocardial infarction [facility]) 2008   treated here at [facility] and transferred to [place] for stent placement   Neuropathy    knees down   Peripheral vascular disease (HCC)    Sleep apnea    "in the past"   Status post primary angioplasty with coronary stent    Wears dentures    full upper and lower   Past Surgical History:  Procedure Laterality Date   CATARACT EXTRACTION W/PHACO Right 03/14/2021   Procedure: CATARACT EXTRACTION PHACO AND INTRAOCULAR LENS PLACEMENT (IOC) RIGHT DIABETIC;  Surgeon: Lockie Mola, MD;  Location: [facility];  Service: Ophthalmology;  Laterality: Right;  Diabetic 16.78 01:39.9   CATARACT EXTRACTION W/PHACO Left 03/28/2021   Procedure: CATARACT EXTRACTION PHACO AND INTRAOCULAR LENS PLACEMENT (IOC) LEFT DIABETIC 6.93 01:22.0;  Surgeon: Lockie Mola, MD;  Location: [facility];  Service: Ophthalmology;  Laterality: Left;  Diabetic   LOWER EXTREMITY ANGIOGRAPHY Right 05/14/2021   Procedure: LOWER EXTREMITY ANGIOGRAPHY;  Surgeon: Annice Needy, MD;  Location: [facility] INVASIVE CV LAB;  Service: Cardiovascular;  Laterality: Right;   LOWER EXTREMITY ANGIOGRAPHY Right 06/14/2021   Procedure: Lower Extremity Angiography;  Surgeon: Annice Needy, MD;  Location: [facility] INVASIVE CV LAB;  Service: Cardiovascular;  Laterality: Right;   LOWER EXTREMITY INTERVENTION Right 06/15/2021   Procedure: LOWER EXTREMITY INTERVENTION;  Surgeon: Annice Needy, MD;  Location: [facility] INVAS[facility] CV LAB;  Service: Cardiovascular;  Laterality: Right;  TONSILLECTOMY     had removed as a child   Social History:  reports that he has never smoked. He has never used smokeless tobacco. He reports current alcohol use of about 21.0 standard drinks of alcohol per week. He reports that he does not use drugs.  No Known Allergies  Family History  Problem Relation Age of Onset   Scoliosis Mother    Heart disease Father      Prior to Admission medications   Medication Sig Start Date End Date Taking? Authorizing Provider  amLODipine (NORVASC) 5 MG tablet Take 10 mg by mouth daily.  12/30/16   [provider]  apixaban (ELIQUIS) 5 MG TABS tablet Take 1 tablet (5 mg total) by mouth 2 (two) times daily. Patient not taking: Reported on 01/25/2022 06/17/21   Burnadette Pop, MD  aspirin EC 81 MG tablet Take 1 tablet (81 mg total) by mouth daily. Patient not taking: Reported on 08/13/2022 05/14/21   Annice Needy, MD  atorvastatin (LIPITOR) 80 MG tablet Take 1 tablet (80 mg total) by mouth daily. 06/18/21   Burnadette Pop, MD  carvedilol (COREG) 12.5 MG tablet Take 1 tablet by mouth 2 (two) times daily. 12/27/16   [provider]  cefdinir (OMNICEF) 300 MG capsule Take 300 mg by mouth 2 (two) times daily. 07/01/22   [provider]  clopidogrel (PLAVIX) 75 MG tablet Take 1 tablet (75 mg total) by mouth daily. 09/18/22   Georgiana Spinner, NP  DULoxetine (CYMBALTA) 30 MG capsule Take 30 mg by mouth daily. Patient not taking: Reported on 07/11/2021    [provider]  fluticasone (FLONASE) 50 MCG/ACT nasal spray Place 2 sprays into both nostrils as needed for allergies or rhinitis. Patient not taking: Reported on 07/11/2021    [provider]  gabapentin (NEURONTIN) 100 MG capsule Take 100 mg by mouth at bedtime.    [provider]  gabapentin (NEURONTIN) 400 MG capsule Take by mouth 2 (two) times daily. 12/06/21   [provider]  Ibuprofen 200 MG CAPS Take 2 tablets by mouth 3 times/day as needed-between meals & bedtime.    [provider]  insulin aspart protamine- aspart (NOVOLOG MIX 70/30) (70-30) 100 UNIT/ML injection Inject 32 Units into the skin 2 (two) times daily with a meal.    [provider]  levocetirizine (XYZAL) 5 MG tablet Take 5 mg by mouth at bedtime. Patient not taking: Reported on 08/13/2022 07/01/22   [provider]   lisinopril (ZESTRIL) 20 MG tablet Take 1 tablet (20 mg total) by mouth daily. 06/17/21 04/18/26  Burnadette Pop, MD  polyethylene glycol (MIRALAX / GLYCOLAX) 17 g packet Take 17 g by mouth daily. Patient not taking: Reported on 08/13/2022 06/18/21   Burnadette Pop, MD  senna-docusate (SENOKOT-S) 8.6-50 MG tablet Take 1 tablet by mouth at bedtime as needed for mild constipation. 06/17/21   Burnadette Pop, MD  zolpidem (AMBIEN) 5 MG tablet Take 1 tablet (5 mg total) by mouth at bedtime as needed for up to 5 doses for sleep. Patient not taking: Reported on 07/11/2021 06/17/21   Burnadette Pop, MD    Physical Exam: Vitals:   11/10/22 0918 11/10/22 0924 11/10/22 1145 11/10/22 1209  BP:  (!) 158/87  (!) 166/83  Pulse:  65 75 79  Resp:  20 14 17   Temp:    97.6 F (36.4 C)  TempSrc:    Oral  SpO2: 98% 100% 99% 98%  Weight:  86.2 kg  Height:  5\' 8"  (1.727 m)     Physical Exam Constitutional:      Appearance: He is obese.  HENT:     Head: Normocephalic and atraumatic.     Nose: Nose normal.  Eyes:     Pupils: Pupils are equal, round, and reactive to light.  Cardiovascular:     Rate and Rhythm: Normal rate and regular rhythm.  Pulmonary:     Effort: Pulmonary effort is normal.  Abdominal:     General: Bowel sounds are normal.  Musculoskeletal:     Comments: Positive bilateral lower extremity edema  Neurological:     General: No focal deficit present.  Psychiatric:        Mood and Affect: Mood normal.        Data Reviewed:  There are no new results to review at this time.  DG Foot Complete Right CLINICAL DATA:  Toe ulcers.  Evaluate for osteomyelitis.  EXAM: RIGHT FOOT COMPLETE - 3+ VIEW  COMPARISON:  None Available.  FINDINGS: There are no signs of acute fracture or dislocation. No focal bone erosions of osteomyelitis identified. Mild hallux valgus deformity with mild degenerative changes at the first MTP joint. Mild dorsal soft tissue edema.  IMPRESSION: 1.  No acute findings. No signs of osteomyelitis. 2. 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  Electronically Signed   By: Signa Kell M.D.   On: 11/10/2022 10:00  Lab Results  Component Value Date   WBC 11.2 (H) 11/10/2022   HGB 15.2 11/10/2022   HCT 43.5 11/10/2022   MCV 87.9 11/10/2022   PLT 303 11/10/2022   Last metabolic panel Lab Results  Component Value Date   GLUCOSE 121 (H) 11/10/2022   NA 132 (L) 11/10/2022   K 4.6 11/10/2022   CL 96 (L) 11/10/2022   CO2 25 11/10/2022   BUN 19 11/10/2022   CREATININE 0.90 11/10/2022   GFRNONAA >60 11/10/2022   CALCIUM 9.2 11/10/2022   PROT 8.0 11/10/2022   ALBUMIN 4.0 11/10/2022   BILITOT 1.5 (H) 11/10/2022   ALKPHOS 70 11/10/2022   AST 27 11/10/2022   ALT 21 11/10/2022   ANIONGAP 11 11/10/2022    Assessment and Plan: * Ischemic ulcer of lower extremity (HCC) Worsening right lower extremity redness swelling and pain over the past 2 weeks in the setting of baseline limb ischemia status post angioplasty and stenting January 2023 Patient reports compliance with Plavix and statin therapy Case reviewed with Dr. [name] with vascular surgery Started on heparin drip in the ER Noted worsening ulcer formation across multiple digits of the foot Will plan to start empiric osteomyelitic/cellulitic coverage with cefepime, Flagyl, vancomycin Follow-up podiatry recommendations with Dr. [name]   Alcohol abuse 3-4 beer daily drinker + mixed drinks  Will place on CIWA protocol  Follow   Mixed hyperlipidemia Continue statin  Diabetic polyneuropathy associated with type 2 diabetes mellitus (HCC) Cont neurontin    Diabetes mellitus type 2, insulin dependent (HCC) SSI   Coronary artery disease with history of myocardial infarction without history of CABG Followed by Dr. [name] outpt  CAD s/p PCI to [facility] with overlapping 3 mm Xience stents of 23 mm and 12 mm length in 2011  No active chest pain On  plavix monotherapy and statin chronically  On heparin drip in the setting of right lower extremity ischemia Follow  Hypertension BP stable  Titrate home regimen        Advance Care Planning:   Code Status:  Full Code   Consults: Vascular Surgery and Podiatry   Family Communication: No family at the bedside   Severity of Illness: The appropriate patient status for this patient is INPATIENT. Inpatient status is judged to be reasonable and necessary in order to provide the required intensity of service to ensure the patient's safety. The patient's presenting symptoms, physical exam findings, and initial radiographic and laboratory data in the context of their chronic comorbidities is felt to place them at high risk for further clinical deterioration. Furthermore, it is not anticipated that the patient will be medically stable for discharge from the hospital within 2 midnights of admission.   * I certify that at the point of admission it is my clinical judgment that the patient will require inpatient hospital care spanning beyond 2 midnights from the point of admission due to high intensity of service, high risk for further deterioration and high frequency of surveillance required.*  Author: Floydene Flock, MD 11/10/2022 12:41 PM  For on call review [email or web address].

## 2022-11-10 NOTE — Consult Note (Signed)
PHARMACY -  BRIEF ANTIBIOTIC NOTE   Pharmacy has received consult(s) for Vancomycin from an ED provider.  The patient's profile has been reviewed for ht/wt/allergies/indication/available labs.    One time order(s) placed for Vancomycin 2g IV.  Further antibiotics/pharmacy consults should be ordered by admitting physician if indicated.                       Thank you, Bettey Costa 11/10/2022  11:00 AM

## 2022-11-10 NOTE — ED Notes (Signed)
Advised nurse that patient has ready bed 

## 2022-11-10 NOTE — ED Provider Notes (Signed)
[facility] Provider Note    Event Date/Time   First MD Initiated Contact with Patient 11/10/22 [phone]     (approximate)   History   Leg Swelling (Right)   HPI  Manuel [name] is a 77 y.o. male   Past medical history of peripheral vascular disease status post mechanical thrombectomy right lower extremity in February 2023, CAD, diabetic, hyperlipidemia, neuropathy, here with pain to the right foot and a black toe on the right foot starting approximately 3 days ago.  No trauma.  Compliant with medications.  No systemic symptoms of fever or chills.  There is swelling redness and pain surrounding the necrotic second toe on the right side.  External Medical Documents Reviewed: January and February 2023 encounters with Dr. Wyn Quaker of vascular surgery for vascular interventions mechanical thrombectomy to the right lower extremity      Physical Exam   Triage Vital Signs: ED Triage Vitals  Encounter Vitals Group     BP 11/10/22 0924 (!) 158/87     Systolic BP Percentile --      Diastolic BP Percentile --      Pulse Rate 11/10/22 0924 65     Resp 11/10/22 0924 20     Temp --      Temp src --      SpO2 11/10/22 0918 98 %     Weight 11/10/22 0924 190 lb (86.2 kg)     Height 11/10/22 0924 5\' 8"  (1.727 m)     Head Circumference --      Peak Flow --      Pain Score 11/10/22 0924 0     Pain Loc --      Pain Education --      Exclude from Growth Chart --     Most recent vital signs: Vitals:   11/10/22 0918 11/10/22 0924  BP:  (!) 158/87  Pulse:  65  Resp:  20  SpO2: 98% 100%    General: Awake, no distress.  CV:  Good peripheral perfusion.  Resp:  Normal effort.  Abd:  No distention.  Other:  Blackened right second toe with surrounding cellulitic changes going to the ankle swelling erythema cool to the touch but not markedly different and temperature to the contralateral side.  No palpable pulses no dopplerable pulses.  No crepitus no pain out of  proportion.   ED Results / Procedures / Treatments   Labs (all labs ordered are listed, but only abnormal results are displayed) Labs Reviewed  COMPREHENSIVE METABOLIC PANEL - Abnormal; Notable for the following components:      Result Value   Sodium 132 (*)    Chloride 96 (*)    Glucose, Bld 121 (*)    Total Bilirubin 1.5 (*)    All other components within normal limits  CBC WITH DIFFERENTIAL/PLATELET - Abnormal; Notable for the following components:   WBC 11.2 (*)    Neutro Abs 8.0 (*)    Monocytes Absolute 1.1 (*)    All other components within normal limits  CULTURE, BLOOD (ROUTINE X 2)  CULTURE, BLOOD (ROUTINE X 2)  LACTIC ACID, PLASMA  LACTIC ACID, PLASMA     I ordered and reviewed the above labs they are notable for Mild leukocytosis 11.2, normal H&H  EKG  ED ECG REPORT I, Pilar Jarvis, the attending physician, personally viewed and interpreted this ECG.   Date: 11/10/2022  EKG Time: 0953  Rate: 70  Rhythm: nsr  Axis: nl  Intervals:none  ST&T Change: no stemi    RADIOLOGY I independently reviewed and interpreted x-ray of the foot and see no obvious erosive bony changes   PROCEDURES:  Critical Care performed: Yes, see critical care procedure note(s)  .Critical Care  Performed by: Pilar Jarvis, MD Authorized by: Pilar Jarvis, MD   Critical care provider statement:    Critical care time (minutes):  30   Critical care was time spent personally by me on the following activities:  Development of treatment plan with patient or surrogate, discussions with consultants, evaluation of patient's response to treatment, examination of patient, ordering and review of laboratory studies, ordering and review of radiographic studies, ordering and performing treatments and interventions, pulse oximetry, re-evaluation of patient's condition and review of old charts    MEDICATIONS ORDERED IN ED: Medications  vancomycin (VANCOREADY) IVPB 2000 mg/400 mL (2,000 mg  Intravenous New Bag/Given 11/10/22 1054)  piperacillin-tazobactam (ZOSYN) IVPB 3.375 g (0 g Intravenous Stopped 11/10/22 1054)    External physician / consultants:  I spoke with Dr. [name] of vascular surgery and Dr. [name] of podiatry regarding care plan for this patient.   IMPRESSION / MDM / ASSESSMENT AND PLAN / ED COURSE  I reviewed the triage vital signs and the nursing notes.                                Patient's presentation is most consistent with acute presentation with potential threat to life or bodily function.  Differential diagnosis includes, but is not limited to, ischemic limb, osteomyelitis, foot infection, sepsis   The patient is on the cardiac monitor to evaluate for evidence of arrhythmia and/or significant heart rate changes.  MDM:    Necrotic toe and surrounding cellulitic changes and pain to the right foot concerning for either ischemic limb, infection, osteomyelitis or all of the above.  Hemodynamics appropriate reassuring, unable to obtain temperature, concern for sepsis, and started on vancomycin and Zosyn.  Labs pending, including blood cultures and lactic.  Patient awake alert oriented pleasant and comfortable.  Nontoxic-appearing.  I consulted with vascular surgery Dr. [name] who recommends heparinizing him and plan for angiography tomorrow.  I also consulted with Dr. [name] of podiatry who agrees with antibiotics and will see him as consultation during this admission, admit to hospitalist service.       FINAL CLINICAL IMPRESSION(S) / ED DIAGNOSES   Final diagnoses:  Limb ischemia  Cellulitis of foot  Toe necrosis (HCC)     Rx / DC Orders   ED Discharge Orders     None        Note:  This document was prepared using Dragon voice recognition software and may include unintentional dictation errors.    Pilar Jarvis, MD 11/10/22 [phone]

## 2022-11-10 NOTE — Assessment & Plan Note (Signed)
Continue statin. 

## 2022-11-10 NOTE — H&P (View-Only) (Signed)
[facility] Vascular Consult Note  MRN : [id number]  Manuel [name] is a 77 y.o. (October 18, 1945) male who presents with chief complaint of  Chief Complaint  Patient presents with   Leg Swelling    Right  .  History of Present Illness: Patient well known to service. History of Viabahn stent placement to the right popliteal artery and SFA; thrombectomy Feb 2023. Has been compliant on Plavix and Statin. Last ABI 0.81  Feb 2024. 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 Patient states he has had pain in the calf and foot for several weeks. States the pain worsened 4-5 days ago along with darkening of his right 4th toe. He states he has been significantly fatigued and has difficulty with ambulating to the mailbox and up and down his stairs at home. Presents for evaluation of right foot pain and toe gangrene.  Current Facility-Administered Medications  Medication Dose Route Frequency Provider Last Rate Last Admin   ceFEPIme (MAXIPIME) 2 g in sodium chloride 0.9 % 100 mL IVPB  2 g Intravenous Q8H Hallaji, Sheema M, RPH 200 mL/hr at 11/10/22 1503 2 g at 11/10/22 1503   folic acid (FOLVITE) tablet 1 mg  1 mg Oral Daily Floydene Flock, MD   1 mg at 11/10/22 1457   heparin ADULT infusion 100 units/mL (25000 units/252mL)  1,400 Units/hr Intravenous Continuous Mila Merry A, RPH 14 mL/hr at 11/10/22 1127 1,400 Units/hr at 11/10/22 1127   LORazepam (ATIVAN) tablet 1-4 mg  1-4 mg Oral Q1H PRN Floydene Flock, MD       Or   LORazepam (ATIVAN) injection 1-4 mg  1-4 mg Intravenous Q1H PRN Floydene Flock, MD       metroNIDAZOLE (FLAGYL) IVPB 500 mg  500 mg Intravenous Q12H Floydene Flock, MD       multivitamin with minerals tablet 1 tablet  1 tablet Oral Daily Floydene Flock, MD   1 tablet at 11/10/22 1457   [START ON 11/11/2022] pneumococcal 20-valent conjugate vaccine (PREVNAR 20) injection 0.5 mL  0.5 mL Intramuscular Tomorrow-1000 Floydene Flock, MD        thiamine (VITAMIN B1) tablet 100 mg  100 mg Oral Daily Floydene Flock, MD   100 mg at 11/10/22 1457   Or   thiamine (VITAMIN B1) injection 100 mg  100 mg Intravenous Daily Floydene Flock, MD       [START ON 11/11/2022] vancomycin (VANCOREADY) IVPB 1750 mg/350 mL  1,750 mg Intravenous Q24H Hallaji, Mardene Speak, RPH        Past Medical History:  Diagnosis Date   Allergies    Arthritis    Diabetes mellitus without complication (HCC)    Gout    Hard of hearing    Hyperlipidemia    Hypertension    Myocardial infarction [facility]) 2008   treated here at [facility] and transferred to [place] for stent placement   Neuropathy    knees down   Peripheral vascular disease (HCC)    Sleep apnea    "in the past"   Status post primary angioplasty with coronary stent    Wears dentures    full upper and lower    Past Surgical History:  Procedure Laterality Date   CATARACT EXTRACTION W/PHACO Right 03/14/2021   Procedure: CATARACT EXTRACTION PHACO AND INTRAOCULAR LENS PLACEMENT (IOC) RIGHT DIABETIC;  Surgeon: Lockie Mola, MD;  Location: [facility];  Service: Ophthalmology;  Laterality: Right;  Diabetic 16.78 01:39.9  CATARACT EXTRACTION W/PHACO Left 03/28/2021   Procedure: CATARACT EXTRACTION PHACO AND INTRAOCULAR LENS PLACEMENT (IOC) LEFT DIABETIC 6.93 01:22.0;  Surgeon: Lockie Mola, MD;  Location: [facility];  Service: Ophthalmology;  Laterality: Left;  Diabetic   LOWER EXTREMITY ANGIOGRAPHY Right 05/14/2021   Procedure: LOWER EXTREMITY ANGIOGRAPHY;  Surgeon: Annice Needy, MD;  Location: [facility] INVASIVE CV LAB;  Service: Cardiovascular;  Laterality: Right;   LOWER EXTREMITY ANGIOGRAPHY Right 06/14/2021   Procedure: Lower Extremity Angiography;  Surgeon: Annice Needy, MD;  Location: [facility] INVASIVE CV LAB;  Service: Cardiovascular;  Laterality: Right;   LOWER EXTREMITY INTERVENTION Right 06/15/2021   Procedure: LOWER EXTREMITY INTERVENTION;  Surgeon: Annice Needy, MD;  Location:  [facility] INVASIVE CV LAB;  Service: Cardiovascular;  Laterality: Right;   TONSILLECTOMY     had removed as a child    Social History Social History   Tobacco Use   Smoking status: Never   Smokeless tobacco: Never  Vaping Use   Vaping status: Never Used  Substance Use Topics   Alcohol use: Yes    Alcohol/week: 21.0 standard drinks of alcohol    Types: 7 Glasses of wine, 14 Cans of beer per week    Comment: occassional   Drug use: No    Family History Family History  Problem Relation Age of Onset   Scoliosis Mother    Heart disease Father     No Known Allergies   REVIEW OF SYSTEMS (Negative unless checked)  Constitutional: [] Weight loss  [] Fever  [] Chills Cardiac: [] Chest pain   [] Chest pressure   [] Palpitations   [] Shortness of breath when laying flat   [] Shortness of breath at rest   [] Shortness of breath with exertion. Vascular:  [x] Pain in legs with walking   [x] Pain in legs at rest   [] Pain in legs when laying flat   [x] Claudication   [x] Pain in feet when walking  [] Pain in feet at rest  [] Pain in feet when laying flat   [] History of DVT   [] Phlebitis   [x] Swelling in legs   [] Varicose veins   [x] Non-healing ulcers Pulmonary:   [] Uses home oxygen   [] Productive cough   [] Hemoptysis   [] Wheeze  [] COPD   [] Asthma Neurologic:  [] Dizziness  [] Blackouts   [] Seizures   [] History of stroke   [] History of TIA  [] Aphasia   [] Temporary blindness   [] Dysphagia   [] Weakness or numbness in arms   [] Weakness or numbness in legs Musculoskeletal:  [] Arthritis   [] Joint swelling   [] Joint pain   [] Low back pain Hematologic:  [] Easy bruising  [] Easy bleeding   [] Hypercoagulable state   [] Anemic  [] Hepatitis Gastrointestinal:  [] Blood in stool   [] Vomiting blood  [] Gastroesophageal reflux/heartburn   [] Difficulty swallowing. Genitourinary:  [] Chronic kidney disease   [] Difficult urination  [] Frequent urination  [] Burning with urination   [] Blood in urine Skin:  [] Rashes   [] Ulcers    [x] Wounds Psychological:  [] History of anxiety   []  History of major depression.  Physical Examination  Vitals:   11/10/22 0924 11/10/22 1145 11/10/22 1209 11/10/22 1300  BP: (!) 158/87  (!) 166/83 (!) 142/84  Pulse: 65 75 79 80  Resp: 20 14 17 18   Temp:   97.6 F (36.4 C) 97.8 F (36.6 C)  TempSrc:   Oral   SpO2: 100% 99% 98% 98%  Weight: 86.2 kg     Height: 5\' 8"  (1.727 m)      Body mass index is 28.89 kg/m. Gen:  WD/WN,  NAD Head: [place]/AT, No temporalis wasting. Prominent temp pulse not noted. Ear/Nose/Throat: Hearing grossly intact, nares w/o erythema or drainage, oropharynx w/o Erythema/Exudate Eyes: Sclera non-icteric, conjunctiva clear Neck: Trachea midline.  No JVD.  Pulmonary:  Good air movement, respirations not labored, equal bilaterally.  Cardiac: RRR, normal S1, S2. Vascular:  Vessel Right Left  Radial Palpable Palpable  Ulnar    Brachial    Carotid Palpable, without bruit Palpable, without bruit  Aorta Not palpable N/A  Femoral Palpable Palpable  Popliteal    PT Non-Palpable   DP Non-Palpable Palpable   Gastrointestinal: soft, non-tender/non-distended. No guarding/reflex.  Musculoskeletal: M/S 5/5 throughout. RIGHT lower extremity- warm to toes, erythema of distal leg and foot with edema, gangrene of right 4th toe, dry ulceration on plantar surface of foot, +motor/+sensory Neurologic: Sensation grossly intact in extremities.  Symmetrical.  Speech is fluent. Motor exam as listed above.  Lymph : No Cervical, Axillary, or Inguinal lymphadenopathy.     CBC Lab Results  Component Value Date   WBC 11.2 (H) 11/10/2022   HGB 15.2 11/10/2022   HCT 43.5 11/10/2022   MCV 87.9 11/10/2022   PLT 303 11/10/2022    BMET    Component Value Date/Time   NA 132 (L) 11/10/2022 0954   NA 137 02/16/2012 2013   K 4.6 11/10/2022 0954   K 3.8 02/16/2012 2013   CL 96 (L) 11/10/2022 0954   CL 102 02/16/2012 2013   CO2 25 11/10/2022 0954   CO2 25 02/16/2012 2013    GLUCOSE 121 (H) 11/10/2022 0954   GLUCOSE 154 (H) 02/16/2012 2013   BUN 19 11/10/2022 0954   BUN 10 02/16/2012 2013   CREATININE 0.90 11/10/2022 0954   CREATININE 0.72 02/16/2012 2013   CALCIUM 9.2 11/10/2022 0954   CALCIUM 9.6 02/16/2012 2013   GFRNONAA >60 11/10/2022 0954   GFRNONAA >60 02/16/2012 2013   GFRAA >60 12/09/2018 0929   GFRAA >60 02/16/2012 2013   Estimated Creatinine Clearance: 71.1 mL/min (by C-G formula based on SCr of 0.9 mg/dL).  COAG Lab Results  Component Value Date   INR 1.1 11/10/2022   INR 1.0 06/12/2021   INR 1.0 04/09/2021    Radiology DG Foot Complete Right  Result Date: 11/10/2022 CLINICAL DATA:  Toe ulcers.  Evaluate for osteomyelitis. EXAM: RIGHT FOOT COMPLETE - 3+ VIEW COMPARISON:  None Available. FINDINGS: There are no signs of acute fracture or dislocation. No focal bone erosions of osteomyelitis identified. Mild hallux valgus deformity with mild degenerative changes at the first MTP joint. Mild dorsal soft tissue edema. IMPRESSION: 1. No acute findings. No signs of osteomyelitis. 2. 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 Electronically Signed   By: Signa Kell M.D.   On: 11/10/2022 10:00      Assessment/Plan 1. 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 Heparin gtt/ Pain control 4. Will plan for right lower extremity angiogram with intervention tomorrow per Dr. Wyn Quaker 5. NPO after MN   Bertram Denver, MD  11/10/2022 3:10 PM    This note was created with Dragon medical transcription system.  Any error is purely unintentional

## 2022-11-10 NOTE — Consult Note (Signed)
ANTICOAGULATION CONSULT NOTE  Pharmacy Consult for Heparin Infusion Indication: chest pain/ACS  No Known Allergies  Patient Measurements: Height: 5\' 8"  (172.7 cm) Weight: 86.2 kg (190 lb) IBW/kg (Calculated) : 68.4 Heparin Dosing Weight: 85.7 kg  Vital Signs: Temp: 99 F (37.2 C) (07/21 1948) Temp Source: Oral (07/21 1727) BP: 131/59 (07/21 1948) Pulse Rate: 96 (07/21 1948)  Labs: Recent Labs    11/10/22 0954 11/10/22 1124 11/10/22 1931  HGB 15.2  --   --   HCT 43.5  --   --   PLT 303  --   --   APTT  --  32  --   LABPROT  --  14.1  --   INR  --  1.1  --   HEPARINUNFRC  --   --  0.12*  CREATININE 0.90  --   --     Estimated Creatinine Clearance: 71.1 mL/min (by C-G formula based on SCr of 0.9 mg/dL).   Medical History: Past Medical History:  Diagnosis Date   Allergies    Arthritis    Diabetes mellitus without complication (HCC)    Gout    Hard of hearing    Hyperlipidemia    Hypertension    Myocardial infarction [facility]) 2008   treated here at [facility] and transferred to [place] for stent placement   Neuropathy    knees down   Peripheral vascular disease (HCC)    Sleep apnea    "in the past"   Status post primary angioplasty with coronary stent    Wears dentures    full upper and lower    Assessment: Manuel [name] is a 77 y.o. male with past medical history of PVD s/p mechanical thrombectomy of right lower extremity in February 2023, CAD, T2DM, HLD, neuropathy. He was on a DOAC previously, but is not on anticoagulant therapy currently. He is here with pain to the right foot and a black toe on the right foot starting approximately 3 days ago. Vascular surgery consulted and planning angiography 7/22. Pharmacy has been consulted to initiate and manage heparin infusion.   Baseline Labs: aPTT 32, INR 1.1, Hgb 15.2, Plt 303  Goal of Therapy:  Heparin level 0.3-0.7 units/ml Monitor platelets by anticoagulation protocol: Yes   Date Time HL Rate/Comment   7/21 1931 0.12 1400/suntherapeutic  Plan:  Give 2600 units bolus x 1 Increase heparin infusion to 1700 units/hr Check HL in 8 hours  Continue to monitor H&H and platelets daily while on heparin infusion   Celene Squibb, PharmD Clinical Pharmacist 11/10/2022 7:59 PM

## 2022-11-10 NOTE — Progress Notes (Addendum)
       CROSS COVER NOTE  NAME: DEVONTRE SIEDSCHLAG MRN: [id number] DOB : September 22, 1945    Concern as stated by nurse / staff   ... pt has gabapentin 400 mg morning and that at night he states he takes 2 tablet, he is asking for another tablet tonight. He is also NPO after midnight for procedure tomorrow, can I have an IV option for pain medication tonight ? DX ischemia right leg, Thanks      Pertinent findings on chart review: H&P from earlier today reviewed Vascular note reviewed: Per Dr. [name]: Going for right lower extremity angiogram with Dr. Wyn Quaker in the a.m. 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 X

## 2022-11-10 NOTE — Assessment & Plan Note (Addendum)
Followed by Dr. [name] outpt  CAD s/p PCI to [facility] with overlapping 3 mm Xience stents of 23 mm and 12 mm length in 2011  No active chest pain On plavix monotherapy and statin chronically  On heparin drip in the setting of right lower extremity ischemia Follow

## 2022-11-10 NOTE — Consult Note (Signed)
ANTICOAGULATION CONSULT NOTE  Pharmacy Consult for Heparin infusion Indication: chest pain/ACS  No Known Allergies  Patient Measurements: Height: 5\' 8"  (172.7 cm) Weight: 86.2 kg (190 lb) IBW/kg (Calculated) : 68.4 Heparin Dosing Weight: 85.7 kg  Vital Signs: Temp: 97.6 F (36.4 C) (07/21 1209) Temp Source: Oral (07/21 1209) BP: 166/83 (07/21 1209) Pulse Rate: 79 (07/21 1209)  Labs: Recent Labs    11/10/22 0954 11/10/22 1124  HGB 15.2  --   HCT 43.5  --   PLT 303  --   APTT  --  32  LABPROT  --  14.1  INR  --  1.1  CREATININE 0.90  --     Estimated Creatinine Clearance: 71.1 mL/min (by C-G formula based on SCr of 0.9 mg/dL).   Medical History: Past Medical History:  Diagnosis Date   Allergies    Arthritis    Diabetes mellitus without complication (HCC)    Gout    Hard of hearing    Hyperlipidemia    Hypertension    Myocardial infarction [facility]) 2008   treated here at [facility] and transferred to [place] for stent placement   Neuropathy    knees down   Peripheral vascular disease (HCC)    Sleep apnea    "in the past"   Status post primary angioplasty with coronary stent    Wears dentures    full upper and lower    Pertinent Medications:  Not on chronic anticoagulation PTA  Assessment: 77 y.o. male with past medical history of peripheral vascular disease status post mechanical thrombectomy right lower extremity in February 2023(completed DOAC therapy, not on anticoagulation currently), CAD, diabetic, hyperlipidemia, neuropathy, here with pain to the right foot and a black toe on the right foot starting approximately 3 days ago. Vascular surgery consulted who recommended heparin and plan for angiography tomorrow.  Baseline labs: aPTT 32 sec, INR 1.1, Hgb 15.2, Plts 303  Goal of Therapy:  Heparin level 0.3-0.7 units/ml Monitor platelets by anticoagulation protocol: Yes   Plan:  Give 5000 units bolus x 1 Start heparin infusion at 1400 units/hr Check  anti-Xa level in 8 hours and daily while on heparin Continue to monitor H&H and platelets  Manuel [name] 11/10/2022,12:57 PM

## 2022-11-10 NOTE — Progress Notes (Signed)
Pharmacy Antibiotic Note  Manuel [name] is a 77 y.o. male admitted on 11/10/2022 with limb ischemia and toe necrosis. Patient has PMH of PVD and DM. Pharmacy has been consulted for cefepime and vancomycin dosing.Patient is also receiving flagyl IV every 12 hours. Patient received vancomycin 2000mg  loading dose in ED.   Plan: Start Vancomycin 1750 mg IV Q 24 hrs. Goal AUC 400-550. Expected AUC: 487 SCr used: 0.9 Vd: 0.72   Start Cefepime 2g IV every 8 hours   Pharmacy will continue to monitor renal function and adjust dose as needed.   Height: 5\' 8"  (172.7 cm) Weight: 86.2 kg (190 lb) IBW/kg (Calculated) : 68.4  No data recorded.  Recent Labs  Lab 11/10/22 0954  WBC 11.2*  CREATININE 0.90  LATICACIDVEN 0.9    Estimated Creatinine Clearance: 71.1 mL/min (by C-G formula based on SCr of 0.9 mg/dL).    No Known Allergies  Antimicrobials this admission: 7/21 Zosyn x 1  7/21 Vancomycin >>  7/21 Cefepime>>  7/21 Flagyl>>  Microbiology results: 7/21 BCx: Pending  Thank you for allowing pharmacy to be a part of this patient's care.  Gardner Candle, PharmD, BCPS Clinical Pharmacist 11/10/2022 11:41 AM

## 2022-11-10 NOTE — ED Triage Notes (Signed)
First Nurse: Pt arrives via EMS from home. Pt arrives with c/o of wounds to his toes on his right foot. Hx of diabetes. AxOx4 per EMS  BG:98

## 2022-11-10 NOTE — Consult Note (Signed)
[facility] Vascular Consult Note  MRN : [id number]  Manuel [name] is a 77 y.o. (October 18, 1945) male who presents with chief complaint of  Chief Complaint  Patient presents with   Leg Swelling    Right  .  History of Present Illness: Patient well known to service. History of Viabahn stent placement to the right popliteal artery and SFA; thrombectomy Feb 2023. Has been compliant on Plavix and Statin. Last ABI 0.81  Feb 2024. 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 Patient states he has had pain in the calf and foot for several weeks. States the pain worsened 4-5 days ago along with darkening of his right 4th toe. He states he has been significantly fatigued and has difficulty with ambulating to the mailbox and up and down his stairs at home. Presents for evaluation of right foot pain and toe gangrene.  Current Facility-Administered Medications  Medication Dose Route Frequency Provider Last Rate Last Admin   ceFEPIme (MAXIPIME) 2 g in sodium chloride 0.9 % 100 mL IVPB  2 g Intravenous Q8H Hallaji, Sheema M, RPH 200 mL/hr at 11/10/22 1503 2 g at 11/10/22 1503   folic acid (FOLVITE) tablet 1 mg  1 mg Oral Daily Floydene Flock, MD   1 mg at 11/10/22 1457   heparin ADULT infusion 100 units/mL (25000 units/252mL)  1,400 Units/hr Intravenous Continuous Mila Merry A, RPH 14 mL/hr at 11/10/22 1127 1,400 Units/hr at 11/10/22 1127   LORazepam (ATIVAN) tablet 1-4 mg  1-4 mg Oral Q1H PRN Floydene Flock, MD       Or   LORazepam (ATIVAN) injection 1-4 mg  1-4 mg Intravenous Q1H PRN Floydene Flock, MD       metroNIDAZOLE (FLAGYL) IVPB 500 mg  500 mg Intravenous Q12H Floydene Flock, MD       multivitamin with minerals tablet 1 tablet  1 tablet Oral Daily Floydene Flock, MD   1 tablet at 11/10/22 1457   [START ON 11/11/2022] pneumococcal 20-valent conjugate vaccine (PREVNAR 20) injection 0.5 mL  0.5 mL Intramuscular Tomorrow-1000 Floydene Flock, MD        thiamine (VITAMIN B1) tablet 100 mg  100 mg Oral Daily Floydene Flock, MD   100 mg at 11/10/22 1457   Or   thiamine (VITAMIN B1) injection 100 mg  100 mg Intravenous Daily Floydene Flock, MD       [START ON 11/11/2022] vancomycin (VANCOREADY) IVPB 1750 mg/350 mL  1,750 mg Intravenous Q24H Hallaji, Mardene Speak, RPH        Past Medical History:  Diagnosis Date   Allergies    Arthritis    Diabetes mellitus without complication (HCC)    Gout    Hard of hearing    Hyperlipidemia    Hypertension    Myocardial infarction [facility]) 2008   treated here at [facility] and transferred to [place] for stent placement   Neuropathy    knees down   Peripheral vascular disease (HCC)    Sleep apnea    "in the past"   Status post primary angioplasty with coronary stent    Wears dentures    full upper and lower    Past Surgical History:  Procedure Laterality Date   CATARACT EXTRACTION W/PHACO Right 03/14/2021   Procedure: CATARACT EXTRACTION PHACO AND INTRAOCULAR LENS PLACEMENT (IOC) RIGHT DIABETIC;  Surgeon: Lockie Mola, MD;  Location: [facility];  Service: Ophthalmology;  Laterality: Right;  Diabetic 16.78 01:39.9  CATARACT EXTRACTION W/PHACO Left 03/28/2021   Procedure: CATARACT EXTRACTION PHACO AND INTRAOCULAR LENS PLACEMENT (IOC) LEFT DIABETIC 6.93 01:22.0;  Surgeon: Lockie Mola, MD;  Location: [facility];  Service: Ophthalmology;  Laterality: Left;  Diabetic   LOWER EXTREMITY ANGIOGRAPHY Right 05/14/2021   Procedure: LOWER EXTREMITY ANGIOGRAPHY;  Surgeon: Annice Needy, MD;  Location: [facility] INVASIVE CV LAB;  Service: Cardiovascular;  Laterality: Right;   LOWER EXTREMITY ANGIOGRAPHY Right 06/14/2021   Procedure: Lower Extremity Angiography;  Surgeon: Annice Needy, MD;  Location: [facility] INVASIVE CV LAB;  Service: Cardiovascular;  Laterality: Right;   LOWER EXTREMITY INTERVENTION Right 06/15/2021   Procedure: LOWER EXTREMITY INTERVENTION;  Surgeon: Annice Needy, MD;  Location:  [facility] INVASIVE CV LAB;  Service: Cardiovascular;  Laterality: Right;   TONSILLECTOMY     had removed as a child    Social History Social History   Tobacco Use   Smoking status: Never   Smokeless tobacco: Never  Vaping Use   Vaping status: Never Used  Substance Use Topics   Alcohol use: Yes    Alcohol/week: 21.0 standard drinks of alcohol    Types: 7 Glasses of wine, 14 Cans of beer per week    Comment: occassional   Drug use: No    Family History Family History  Problem Relation Age of Onset   Scoliosis Mother    Heart disease Father     No Known Allergies   REVIEW OF SYSTEMS (Negative unless checked)  Constitutional: [] Weight loss  [] Fever  [] Chills Cardiac: [] Chest pain   [] Chest pressure   [] Palpitations   [] Shortness of breath when laying flat   [] Shortness of breath at rest   [] Shortness of breath with exertion. Vascular:  [x] Pain in legs with walking   [x] Pain in legs at rest   [] Pain in legs when laying flat   [x] Claudication   [x] Pain in feet when walking  [] Pain in feet at rest  [] Pain in feet when laying flat   [] History of DVT   [] Phlebitis   [x] Swelling in legs   [] Varicose veins   [x] Non-healing ulcers Pulmonary:   [] Uses home oxygen   [] Productive cough   [] Hemoptysis   [] Wheeze  [] COPD   [] Asthma Neurologic:  [] Dizziness  [] Blackouts   [] Seizures   [] History of stroke   [] History of TIA  [] Aphasia   [] Temporary blindness   [] Dysphagia   [] Weakness or numbness in arms   [] Weakness or numbness in legs Musculoskeletal:  [] Arthritis   [] Joint swelling   [] Joint pain   [] Low back pain Hematologic:  [] Easy bruising  [] Easy bleeding   [] Hypercoagulable state   [] Anemic  [] Hepatitis Gastrointestinal:  [] Blood in stool   [] Vomiting blood  [] Gastroesophageal reflux/heartburn   [] Difficulty swallowing. Genitourinary:  [] Chronic kidney disease   [] Difficult urination  [] Frequent urination  [] Burning with urination   [] Blood in urine Skin:  [] Rashes   [] Ulcers    [x] Wounds Psychological:  [] History of anxiety   []  History of major depression.  Physical Examination  Vitals:   11/10/22 0924 11/10/22 1145 11/10/22 1209 11/10/22 1300  BP: (!) 158/87  (!) 166/83 (!) 142/84  Pulse: 65 75 79 80  Resp: 20 14 17 18   Temp:   97.6 F (36.4 C) 97.8 F (36.6 C)  TempSrc:   Oral   SpO2: 100% 99% 98% 98%  Weight: 86.2 kg     Height: 5\' 8"  (1.727 m)      Body mass index is 28.89 kg/m. Gen:  WD/WN,  NAD Head: [place]/AT, No temporalis wasting. Prominent temp pulse not noted. Ear/Nose/Throat: Hearing grossly intact, nares w/o erythema or drainage, oropharynx w/o Erythema/Exudate Eyes: Sclera non-icteric, conjunctiva clear Neck: Trachea midline.  No JVD.  Pulmonary:  Good air movement, respirations not labored, equal bilaterally.  Cardiac: RRR, normal S1, S2. Vascular:  Vessel Right Left  Radial Palpable Palpable  Ulnar    Brachial    Carotid Palpable, without bruit Palpable, without bruit  Aorta Not palpable N/A  Femoral Palpable Palpable  Popliteal    PT Non-Palpable   DP Non-Palpable Palpable   Gastrointestinal: soft, non-tender/non-distended. No guarding/reflex.  Musculoskeletal: M/S 5/5 throughout. RIGHT lower extremity- warm to toes, erythema of distal leg and foot with edema, gangrene of right 4th toe, dry ulceration on plantar surface of foot, +motor/+sensory Neurologic: Sensation grossly intact in extremities.  Symmetrical.  Speech is fluent. Motor exam as listed above.  Lymph : No Cervical, Axillary, or Inguinal lymphadenopathy.     CBC Lab Results  Component Value Date   WBC 11.2 (H) 11/10/2022   HGB 15.2 11/10/2022   HCT 43.5 11/10/2022   MCV 87.9 11/10/2022   PLT 303 11/10/2022    BMET    Component Value Date/Time   NA 132 (L) 11/10/2022 0954   NA 137 02/16/2012 2013   K 4.6 11/10/2022 0954   K 3.8 02/16/2012 2013   CL 96 (L) 11/10/2022 0954   CL 102 02/16/2012 2013   CO2 25 11/10/2022 0954   CO2 25 02/16/2012 2013    GLUCOSE 121 (H) 11/10/2022 0954   GLUCOSE 154 (H) 02/16/2012 2013   BUN 19 11/10/2022 0954   BUN 10 02/16/2012 2013   CREATININE 0.90 11/10/2022 0954   CREATININE 0.72 02/16/2012 2013   CALCIUM 9.2 11/10/2022 0954   CALCIUM 9.6 02/16/2012 2013   GFRNONAA >60 11/10/2022 0954   GFRNONAA >60 02/16/2012 2013   GFRAA >60 12/09/2018 0929   GFRAA >60 02/16/2012 2013   Estimated Creatinine Clearance: 71.1 mL/min (by C-G formula based on SCr of 0.9 mg/dL).  COAG Lab Results  Component Value Date   INR 1.1 11/10/2022   INR 1.0 06/12/2021   INR 1.0 04/09/2021    Radiology DG Foot Complete Right  Result Date: 11/10/2022 CLINICAL DATA:  Toe ulcers.  Evaluate for osteomyelitis. EXAM: RIGHT FOOT COMPLETE - 3+ VIEW COMPARISON:  None Available. FINDINGS: There are no signs of acute fracture or dislocation. No focal bone erosions of osteomyelitis identified. Mild hallux valgus deformity with mild degenerative changes at the first MTP joint. Mild dorsal soft tissue edema. IMPRESSION: 1. No acute findings. No signs of osteomyelitis. 2. 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 Electronically Signed   By: Signa Kell M.D.   On: 11/10/2022 10:00      Assessment/Plan 1. 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 Heparin gtt/ Pain control 4. Will plan for right lower extremity angiogram with intervention tomorrow per Dr. Wyn Quaker 5. NPO after MN   Bertram Denver, MD  11/10/2022 3:10 PM    This note was created with Dragon medical transcription system.  Any error is purely unintentional

## 2022-11-10 NOTE — Assessment & Plan Note (Signed)
Cont neurontin

## 2022-11-10 NOTE — Progress Notes (Signed)
  Chaplain On-Call responded to Spiritual Care Consult Order from Lowman C. Alvester Morin, MD. The request was to provide Advance Directives information to the patient.  Chaplain offered the AD documents and education to the patient, including the process for completion in the hospital if he chooses.  The patient stated his understanding.  Chaplain provided spiritual and emotional support.  Chaplain Evelena Peat M.Div., [facility]

## 2022-11-11 ENCOUNTER — Encounter
Admission: EM | Disposition: A | Discharge status: Home Health | Payer: Self-pay | Source: Home / Self Care | Attending: Internal Medicine

## 2022-11-11 DIAGNOSIS — I708 Atherosclerosis of other arteries: Secondary | ICD-10-CM | POA: Diagnosis not present

## 2022-11-11 DIAGNOSIS — I10 Essential (primary) hypertension: Secondary | ICD-10-CM

## 2022-11-11 DIAGNOSIS — I252 Old myocardial infarction: Secondary | ICD-10-CM

## 2022-11-11 DIAGNOSIS — I2510 Atherosclerotic heart disease of native coronary artery without angina pectoris: Secondary | ICD-10-CM | POA: Diagnosis not present

## 2022-11-11 DIAGNOSIS — E1142 Type 2 diabetes mellitus with diabetic polyneuropathy: Secondary | ICD-10-CM

## 2022-11-11 DIAGNOSIS — I70261 Atherosclerosis of native arteries of extremities with gangrene, right leg: Secondary | ICD-10-CM | POA: Diagnosis not present

## 2022-11-11 DIAGNOSIS — I70202 Unspecified atherosclerosis of native arteries of extremities, left leg: Secondary | ICD-10-CM | POA: Diagnosis not present

## 2022-11-11 DIAGNOSIS — L97909 Non-pressure chronic ulcer of unspecified part of unspecified lower leg with unspecified severity: Secondary | ICD-10-CM | POA: Diagnosis not present

## 2022-11-11 DIAGNOSIS — I998 Other disorder of circulatory system: Secondary | ICD-10-CM

## 2022-11-11 DIAGNOSIS — Z95828 Presence of other vascular implants and grafts: Secondary | ICD-10-CM | POA: Diagnosis not present

## 2022-11-11 DIAGNOSIS — I700 Atherosclerosis of aorta: Secondary | ICD-10-CM | POA: Diagnosis not present

## 2022-11-11 DIAGNOSIS — E119 Type 2 diabetes mellitus without complications: Secondary | ICD-10-CM

## 2022-11-11 DIAGNOSIS — Z794 Long term (current) use of insulin: Secondary | ICD-10-CM

## 2022-11-11 HISTORY — PX: LOWER EXTREMITY ANGIOGRAPHY: CATH118251

## 2022-11-11 LAB — CBC
HCT: 35.6 % — ABNORMAL LOW (ref 39.0–52.0)
Hemoglobin: 12.5 g/dL — ABNORMAL LOW (ref 13.0–17.0)
MCH: 30.5 pg (ref 26.0–34.0)
MCHC: 35.1 g/dL (ref 30.0–36.0)
MCV: 86.8 fL (ref 80.0–100.0)
Platelets: 305 10*3/uL (ref 150–400)
RBC: 4.1 MIL/uL — ABNORMAL LOW (ref 4.22–5.81)
RDW: 13.2 % (ref 11.5–15.5)
WBC: 10.2 10*3/uL (ref 4.0–10.5)
nRBC: 0 % (ref 0.0–0.2)

## 2022-11-11 LAB — GLUCOSE, CAPILLARY
Glucose-Capillary: 103 mg/dL — ABNORMAL HIGH (ref 70–99)
Glucose-Capillary: 125 mg/dL — ABNORMAL HIGH (ref 70–99)
Glucose-Capillary: 99 mg/dL (ref 70–99)
Glucose-Capillary: 110 mg/dL — ABNORMAL HIGH (ref 70–99)
Glucose-Capillary: 143 mg/dL — ABNORMAL HIGH (ref 70–99)

## 2022-11-11 LAB — CULTURE, BLOOD (ROUTINE X 2)
Culture: NO GROWTH
Special Requests: ADEQUATE

## 2022-11-11 LAB — HEPARIN LEVEL (UNFRACTIONATED)
Heparin Unfractionated: 0.19 IU/mL — ABNORMAL LOW (ref 0.30–0.70)
Heparin Unfractionated: 0.37 IU/mL (ref 0.30–0.70)

## 2022-11-11 SURGERY — LOWER EXTREMITY ANGIOGRAPHY
Anesthesia: Moderate Sedation | Laterality: Right

## 2022-11-11 MED ORDER — IODIXANOL 320 MG/ML IV SOLN
INTRAVENOUS | Status: DC | PRN
  Administered 2022-11-11: 50 mL via INTRA_ARTERIAL

## 2022-11-11 MED ORDER — MIDAZOLAM HCL 2 MG/2ML IJ SOLN
INTRAMUSCULAR | Status: DC | PRN
  Administered 2022-11-11 (×2): 1 mg via INTRAVENOUS

## 2022-11-11 MED ORDER — VITAMIN C 500 MG PO TABS
500.0000 mg | ORAL_TABLET | Freq: Two times a day (BID) | ORAL | Status: DC
  Administered 2022-11-11 – 2022-11-21 (×19): 500 mg via ORAL
  Filled 2022-11-11 (×22): qty 1

## 2022-11-11 MED ORDER — HEPARIN SODIUM (PORCINE) 1000 UNIT/ML IJ SOLN
INTRAMUSCULAR | Status: AC
  Filled 2022-11-11: qty 10

## 2022-11-11 MED ORDER — SODIUM CHLORIDE 0.9 % IV SOLN: INTRAVENOUS | Status: DC

## 2022-11-11 MED ORDER — HEPARIN (PORCINE) IN NACL 1000-0.9 UT/500ML-% IV SOLN
INTRAVENOUS | Status: DC | PRN
  Administered 2022-11-11: 1000 mL

## 2022-11-11 MED ORDER — FAMOTIDINE 20 MG PO TABS: 40.0000 mg | ORAL_TABLET | Freq: Once | ORAL | Status: DC | PRN

## 2022-11-11 MED ORDER — ENSURE MAX PROTEIN PO LIQD
11.0000 [oz_av] | Freq: Two times a day (BID) | ORAL | Status: DC
  Administered 2022-11-12 – 2022-11-26 (×21): 11 [oz_av] via ORAL
  Filled 2022-11-11 (×3): qty 330

## 2022-11-11 MED ORDER — DIPHENHYDRAMINE HCL 50 MG/ML IJ SOLN: 50.0000 mg | Freq: Once | INTRAMUSCULAR | Status: DC | PRN

## 2022-11-11 MED ORDER — CEFAZOLIN SODIUM-DEXTROSE 2-4 GM/100ML-% IV SOLN
2.0000 g | INTRAVENOUS | Status: DC
  Filled 2022-11-11: qty 100

## 2022-11-11 MED ORDER — METHYLPREDNISOLONE SODIUM SUCC 125 MG IJ SOLR: 125.0000 mg | Freq: Once | INTRAMUSCULAR | Status: DC | PRN

## 2022-11-11 MED ORDER — ONDANSETRON HCL 4 MG/2ML IJ SOLN: 4.0000 mg | Freq: Four times a day (QID) | INTRAMUSCULAR | Status: DC | PRN

## 2022-11-11 MED ORDER — MIDAZOLAM HCL 2 MG/ML PO SYRP
8.0000 mg | ORAL_SOLUTION | Freq: Once | ORAL | Status: DC | PRN
  Filled 2022-11-11: qty 5

## 2022-11-11 MED ORDER — MIDAZOLAM HCL 2 MG/2ML IJ SOLN
INTRAMUSCULAR | Status: AC
  Filled 2022-11-11: qty 2

## 2022-11-11 MED ORDER — LIDOCAINE-EPINEPHRINE (PF) 1 %-1:200000 IJ SOLN
INTRAMUSCULAR | Status: DC | PRN
  Administered 2022-11-11: 10 mL via INTRADERMAL

## 2022-11-11 MED ORDER — FENTANYL CITRATE PF 50 MCG/ML IJ SOSY
PREFILLED_SYRINGE | INTRAMUSCULAR | Status: AC
  Filled 2022-11-11: qty 1

## 2022-11-11 MED ORDER — HYDROMORPHONE HCL 1 MG/ML IJ SOLN: 1.0000 mg | Freq: Once | INTRAMUSCULAR | Status: DC | PRN

## 2022-11-11 MED ORDER — FENTANYL CITRATE PF 50 MCG/ML IJ SOSY: 12.5000 ug | PREFILLED_SYRINGE | Freq: Once | INTRAMUSCULAR | Status: DC | PRN

## 2022-11-11 MED ORDER — HEPARIN BOLUS VIA INFUSION
2600.0000 [IU] | Freq: Once | INTRAVENOUS | Status: AC
  Administered 2022-11-11: 2600 [IU] via INTRAVENOUS
  Filled 2022-11-11: qty 2600

## 2022-11-11 MED ORDER — FENTANYL CITRATE (PF) 100 MCG/2ML IJ SOLN
INTRAMUSCULAR | Status: DC | PRN
  Administered 2022-11-11: 50 ug via INTRAVENOUS

## 2022-11-11 SURGICAL SUPPLY — 10 items
CATH ANGIO 5F PIGTAIL 65CM (CATHETERS) IMPLANT
CATH BEACON 5 .035 65 KMP TIP (CATHETERS) IMPLANT
COVER EZ STRL 42X30 (DRAPES) IMPLANT
DEVICE STARCLOSE SE CLOSURE (Vascular Products) IMPLANT
GLIDEWIRE ADV .035X260CM (WIRE) IMPLANT
PACK ANGIOGRAPHY (CUSTOM PROCEDURE TRAY) ×1 IMPLANT
SHEATH BRITE TIP 5FRX11 (SHEATH) IMPLANT
SYR MEDRAD MARK 7 150ML (SYRINGE) IMPLANT
TUBING CONTRAST HIGH PRESS 72 (TUBING) IMPLANT
WIRE GUIDERIGHT .035X150 (WIRE) IMPLANT

## 2022-11-11 NOTE — Progress Notes (Signed)
Initial Nutrition Assessment  DOCUMENTATION CODES:   Not applicable  INTERVENTION:   Ensure Max protein supplement po BID, each supplement provides 150kcal and 30g of protein.  MVI, folic acid and thiamine po daily   Vitamin C 500mg  po BID  Pt at high refeed risk; recommend monitor potassium, magnesium and phosphorus labs daily until stable  NUTRITION DIAGNOSIS:   Increased nutrient needs related to wound healing as evidenced by estimated needs.  GOAL:   Patient will meet greater than or equal to 90% of their needs  MONITOR:   PO intake, Supplement acceptance, Labs, Weight trends, I & O's, Skin  REASON FOR ASSESSMENT:   Consult Wound healing  ASSESSMENT:   77 y.o. male with medical history significant of  insulin-dependent DM, HTN, CAD, etoh abuse, depression, OSA, MI, gout and PAD s/p angioplasty with stent 05/14/2021 who is admitted with right lower extremity ischemia and ulcer formation with gangrenous toe.  RD unable to see pt today as pt in the OR at time of RD visit. Pt documented to be eating ~70% of meals in hospital. Pt NPO today for vascular intervention. RD will add supplements and vitamins to help support wound healing. Pt is likely at refeed risk secondary to etoh abuse. Per chart, pt appears weight stable at baseline. RD will obtain nutrition related history and exam at follow-up.   Medications reviewed and include: folic acid, insulin, thiamine, NaCl @75ml /hr, cefepime, heparin, metronidazole, vancomycin   Labs reviewed: Na 132(l), K 4.6 wnl- 7/21 Cbgs- 103, 125 x 24 hrs  NUTRITION - FOCUSED PHYSICAL EXAM: Unable to perform at this time   Diet Order:   Diet Order             Diet NPO time specified  Diet effective midnight                  EDUCATION NEEDS:   Not appropriate for education at this time  Skin:  Skin Assessment: Reviewed RN Assessment (R diabetic foot ulcer)  Last BM:  7/21  Height:   Ht Readings from Last 1 Encounters:   11/10/22 5\' 8"  (1.727 m)    Weight:   Wt Readings from Last 1 Encounters:  11/10/22 86.2 kg    Ideal Body Weight:  70 kg  BMI:  Body mass index is 28.89 kg/m.  Estimated Nutritional Needs:   Kcal:  1900-2200kcal/day  Protein:  90-105g/day  Fluid:  1.8-2.1L/day  Betsey Holiday MS, RD, LDN Please refer to [facility] for RD and/or RD on-call/weekend/after hours pager

## 2022-11-11 NOTE — Inpatient Diabetes Management (Signed)
Inpatient Diabetes Program Recommendations  AACE/ADA: New Consensus Statement on Inpatient Glycemic Control (2015)  Target Ranges:  Prepandial:   less than 140 mg/dL      Peak postprandial:   less than 180 mg/dL (1-2 hours)      Critically ill patients:  140 - 180 mg/dL   Lab Results  Component Value Date   GLUCAP 125 (H) 11/11/2022    Review of Glycemic Control  Latest Reference Range & Units 11/10/22 17:17 11/10/22 18:16 11/10/22 22:37 11/11/22 10:30  Glucose-Capillary 70 - 99 mg/dL 68 (L) 93 062 (H) 694 (H)   Diabetes history: DM  Outpatient Diabetes medications:  70/30 32 units bid  Current orders for Inpatient glycemic control:  Novolog 0-9 units tid with meals and HS  Inpatient Diabetes Program Recommendations:    Blood sugars within goal with only correction insulin.  Patient is NPO today. Will follow.   Thanks,  Beryl Meager, RN, BC-ADM Inpatient Diabetes Coordinator Pager [phone] (8a-5p)

## 2022-11-11 NOTE — Progress Notes (Addendum)
Progress Note   Patient: Manuel [name]:[id number] DOB: Feb 27, 1946 DOA: 11/10/2022     1 DOS: the patient was seen and examined on 11/11/2022   Brief hospital course: Taken from H&P.   Manuel [name] is a 77 y.o. male with medical history significant of  insulin-dependent DM, HTN, CAD, PAD s/p angioplasty with stent 05/14/2021 presenting with right lower extremity ischemia and ulcer formation with gangrenous toe.  Presented to the ER afebrile, hemodynamically stable. White count 11.2, hemoglobin 15.2, platelets 303, lactate 1.2, creatinine 0.9. T. bili 1.5. Plain films of the right foot with no signs of osteomyelitis. Dr. [name] with vascular surgery initially consulted with recommendation for heparin drip. Dr. [name] with podiatry also consulted for further evaluation.   7/22: Vital stable.  Going for lower extremity angiography and possible angioplasty with vascular today.  CRP elevated at 10.1, decreased prealbumin at 15 and normal ESR.  Preliminary blood cultures negative.  Continue current broad-spectrum antibiotics.  Found to have very extensive disease and most likely will required bilateral femoral endarterectomies and bilateral iliac stents.  Vascular is looking for a OR time for this extensive procedure which will require 4 to 5 hours.  Assessment and Plan: * Ischemic ulcer of lower extremity (HCC) Worsening right lower extremity redness swelling and pain over the past 2 weeks in the setting of baseline limb ischemia status post angioplasty and stenting January 2023 Patient reports compliance with Plavix and statin therapy Case reviewed with Dr. [name] with vascular surgery Started on heparin drip in the ER Noted worsening ulcer formation across multiple digits of the foot with concern for gangrene, going for lower extremity angiography and possible angioplasty today -Continue empiric osteomyelitic/cellulitic coverage with cefepime, Flagyl, vancomycin Follow-up podiatry  recommendations with Dr. [name] Follow-up postprocedural vascular recommendations   Diabetic polyneuropathy associated with type 2 diabetes mellitus (HCC) Cont neurontin    Coronary artery disease with history of myocardial infarction without history of CABG Followed by Dr. [name] outpt  CAD s/p PCI to [facility] with overlapping 3 mm Xience stents of 23 mm and 12 mm length in 2011  No active chest pain On plavix monotherapy and statin chronically  On heparin drip in the setting of right lower extremity ischemia Follow  Hypertension BP stable  Titrate home regimen    Diabetes mellitus type 2, insulin dependent (HCC) SSI   Alcohol abuse 3-4 beer daily drinker + mixed drinks  Will place on CIWA protocol  Follow   Mixed hyperlipidemia Continue statin   Subjective: Patient was having 8/10 pain when seen today.  Awaiting his vascular procedure.  Wife at bedside.  Physical Exam: Vitals:   11/11/22 1510 11/11/22 1515 11/11/22 1520 11/11/22 1525  BP: (!) 158/97 (!) 159/90 (!) 155/94 (!) 155/94  Pulse: 93 94 94 (!) 0  Resp: (!) 24 17 (!) 24   Temp:      TempSrc:      SpO2: 92% 92% 92%   Weight:      Height:       General.  Well-developed elderly man, in no acute distress. Pulmonary.  Lungs clear bilaterally, normal respiratory effort. CV.  Regular rate and rhythm, no JVD, rub or murmur. Abdomen.  Soft, nontender, nondistended, BS positive. CNS.  Alert and oriented .  No focal neurologic deficit. Extremities.  Edema and erythema of LLE, gangrenous fourth toe and diminished pedal pulses. Psychiatry.  Judgment and insight appears normal.   Data Reviewed: Prior data reviewed  Family Communication: Discussed with wife  at bedside  Disposition: Status is: Inpatient Remains inpatient appropriate because: Severity of illness  Planned Discharge Destination:  To be determined  Time spent: 50 minutes  This record has been created using Conservation officer, historic buildings.  Errors have been sought and corrected,but may not always be located. Such creation errors do not reflect on the standard of care.   Author: Arnetha Courser, MD 11/11/2022 3:31 PM  For on call review [email or web address].

## 2022-11-11 NOTE — Consult Note (Signed)
ANTICOAGULATION CONSULT NOTE  Pharmacy Consult for Heparin Infusion Indication: chest pain/ACS  No Known Allergies  Patient Measurements: Height: 5\' 8"  (172.7 cm) Weight: 86.2 kg (190 lb) IBW/kg (Calculated) : 68.4 Heparin Dosing Weight: 85.7 kg  Vital Signs: Temp: 98.1 F (36.7 C) (07/22 1419) Temp Source: Oral (07/22 1419) BP: 156/88 (07/22 1419) Pulse Rate: 98 (07/22 1419)  Labs: Recent Labs    11/10/22 0954 11/10/22 1124 11/10/22 1931 11/11/22 0407 11/11/22 1357  HGB 15.2  --   --  12.5*  --   HCT 43.5  --   --  35.6*  --   PLT 303  --   --  305  --   APTT  --  32  --   --   --   LABPROT  --  14.1  --   --   --   INR  --  1.1  --   --   --   HEPARINUNFRC  --   --  0.12* 0.19* 0.37  CREATININE 0.90  --   --   --   --     Estimated Creatinine Clearance: 71.1 mL/min (by C-G formula based on SCr of 0.9 mg/dL).   Medical History: Past Medical History:  Diagnosis Date   Allergies    Arthritis    Diabetes mellitus without complication (HCC)    Gout    Hard of hearing    Hyperlipidemia    Hypertension    Myocardial infarction [facility]) 2008   treated here at [facility] and transferred to [place] for stent placement   Neuropathy    knees down   Peripheral vascular disease (HCC)    Sleep apnea    "in the past"   Status post primary angioplasty with coronary stent    Wears dentures    full upper and lower    Assessment: Manuel [name] is a 77 y.o. male with past medical history of PVD s/p mechanical thrombectomy of right lower extremity in February 2023, CAD, T2DM, HLD, neuropathy. He was on a DOAC previously, but is not on anticoagulant therapy currently. He is here with pain to the right foot and a black toe on the right foot starting approximately 3 days ago. Vascular surgery consulted and planning angiography 7/22. Pharmacy has been consulted to initiate and manage heparin infusion.   Baseline Labs: aPTT 32, INR 1.1, Hgb 15.2, Plt 303  Goal of Therapy:   Heparin level 0.3-0.7 units/ml Monitor platelets by anticoagulation protocol: Yes   Date Time HL Rate/Comment  7/21 1931 0.12 1400/subtherapeutic,>1700 7/22 0407 0.19 1700/subtherapeutic >1950 7/23 1357 0.37 Therapeutic x 1 Plan:  HL therapeutic x 1 Continue heparin infusion at 1950 units/hr Check anti-Xa level in 8 hours to confirm Daily CBC while on heparin  Manuel [name], PharmD Clinical Pharmacist 11/11/2022 2:28 PM

## 2022-11-11 NOTE — Consult Note (Signed)
ANTICOAGULATION CONSULT NOTE  Pharmacy Consult for Heparin Infusion Indication: chest pain/ACS  No Known Allergies  Patient Measurements: Height: 5\' 8"  (172.7 cm) Weight: 86.2 kg (190 lb) IBW/kg (Calculated) : 68.4 Heparin Dosing Weight: 85.7 kg  Vital Signs: Temp: 97.9 F (36.6 C) (07/22 0332) Temp Source: Oral (07/22 0332) BP: 126/72 (07/22 0332) Pulse Rate: 78 (07/22 0332)  Labs: Recent Labs    11/10/22 0954 11/10/22 1124 11/10/22 1931 11/11/22 0407  HGB 15.2  --   --  12.5*  HCT 43.5  --   --  35.6*  PLT 303  --   --  305  APTT  --  32  --   --   LABPROT  --  14.1  --   --   INR  --  1.1  --   --   HEPARINUNFRC  --   --  0.12* 0.19*  CREATININE 0.90  --   --   --     Estimated Creatinine Clearance: 71.1 mL/min (by C-G formula based on SCr of 0.9 mg/dL).   Medical History: Past Medical History:  Diagnosis Date   Allergies    Arthritis    Diabetes mellitus without complication (HCC)    Gout    Hard of hearing    Hyperlipidemia    Hypertension    Myocardial infarction [facility]) 2008   treated here at [facility] and transferred to [place] for stent placement   Neuropathy    knees down   Peripheral vascular disease (HCC)    Sleep apnea    "in the past"   Status post primary angioplasty with coronary stent    Wears dentures    full upper and lower    Assessment: GUILHERME SCHWENKE is a 77 y.o. male with past medical history of PVD s/p mechanical thrombectomy of right lower extremity in February 2023, CAD, T2DM, HLD, neuropathy. He was on a DOAC previously, but is not on anticoagulant therapy currently. He is here with pain to the right foot and a black toe on the right foot starting approximately 3 days ago. Vascular surgery consulted and planning angiography 7/22. Pharmacy has been consulted to initiate and manage heparin infusion.   Baseline Labs: aPTT 32, INR 1.1, Hgb 15.2, Plt 303  Goal of Therapy:  Heparin level 0.3-0.7 units/ml Monitor platelets by  anticoagulation protocol: Yes   Date Time HL Rate/Comment  7/21 1931 0.12 1400/subtherapeutic,>1700 7/22 0407 0.19 1700/subtherapeutic >1950  Plan:  Give 2600 units bolus x 1 Increase heparin infusion to 1950 units/hr Check HL in 8 hours  Continue to monitor H&H and platelets daily while on heparin infusion   Angelique Blonder, PharmD Clinical Pharmacist 11/11/2022 5:32 AM

## 2022-11-11 NOTE — Plan of Care (Signed)
PRN pain meds given. Heparin drip running at this time. Call bell at reach. Bed alarm in place.  Problem: Education: Goal: Knowledge of General Education information will improve Description: Including pain rating scale, medication(s)/side effects and non-pharmacologic comfort measures Outcome: Not Progressing   Problem: Health Behavior/Discharge Planning: Goal: Ability to manage health-related needs will improve Outcome: Not Progressing   Problem: Clinical Measurements: Goal: Ability to maintain clinical measurements within normal limits will improve Outcome: Not Progressing Goal: Will remain free from infection Outcome: Not Progressing Goal: Diagnostic test results will improve Outcome: Not Progressing Goal: Respiratory complications will improve Outcome: Not Progressing Goal: Cardiovascular complication will be avoided Outcome: Not Progressing   Problem: Activity: Goal: Risk for activity intolerance will decrease Outcome: Not Progressing   Problem: Nutrition: Goal: Adequate nutrition will be maintained Outcome: Not Progressing   Problem: Coping: Goal: Level of anxiety will decrease Outcome: Not Progressing   Problem: Elimination: Goal: Will not experience complications related to bowel motility Outcome: Not Progressing Goal: Will not experience complications related to urinary retention Outcome: Not Progressing   Problem: Pain Managment: Goal: General experience of comfort will improve Outcome: Not Progressing   Problem: Safety: Goal: Ability to remain free from injury will improve Outcome: Not Progressing   Problem: Skin Integrity: Goal: Risk for impaired skin integrity will decrease Outcome: Not Progressing   Problem: Education: Goal: Ability to describe self-care measures that may prevent or decrease complications (Diabetes Survival Skills Education) will improve Outcome: Not Progressing Goal: Individualized Educational Video(s) Outcome: Not Progressing    Problem: Coping: Goal: Ability to adjust to condition or change in health will improve Outcome: Not Progressing

## 2022-11-11 NOTE — Op Note (Signed)
[facility]  Percutaneous Study/Intervention Procedural Note   Date of Surgery: 11/11/2022  Surgeon(s):Montray Kliebert    Assistants:none  Pre-operative Diagnosis: PAD with gangrene right foot  Post-operative diagnosis:  Same  Procedure(s) Performed:             1.  Ultrasound guidance for vascular access left femoral artery             2.  Catheter placement into right common femoral artery from left femoral approach             3.  Aortogram and selective right lower extremity angiogram             4.  StarClose closure device left femoral artery  EBL: 5 cc  Contrast: 50 cc  Fluoro Time: 1.7 minutes  Moderate Conscious Sedation Time: approximately 34 minutes using 2 mg of Versed and 50 mcg of Fentanyl              Indications:  Patient is a 77 y.o.male with a long history of PAD who presents with rest pain and gangrenous changes to the right foot. The patient is brought in for angiography for further evaluation and potential treatment.  Due to the limb threatening nature of the situation, angiogram was performed for attempted limb salvage. The patient is aware that if the procedure fails, amputation would be expected.  The patient also understands that even with successful revascularization, amputation may still be required due to the severity of the situation.  Risks and benefits are discussed and informed consent is obtained.   Procedure:  The patient was identified and appropriate procedural time out was performed.  The patient was then placed supine on the table and prepped and draped in the usual sterile fashion. Moderate conscious sedation was administered during a face to face encounter with the patient throughout the procedure with my supervision of the RN administering medicines and monitoring the patient's vital signs, pulse oximetry, telemetry and mental status throughout from the start of the procedure until the patient was taken to the recovery room.  Ultrasound was used to evaluate the left common femoral artery.  It was patent in the area accessed but heavily calcific.  A digital ultrasound image was acquired.  A Seldinger needle was used to access the left common femoral artery under direct ultrasound guidance and a permanent image was performed.  A 0.035 J wire was advanced without resistance and a 5Fr sheath was placed.  Pigtail catheter was placed into the aorta and an AP aortogram was performed. This demonstrated normal renal arteries, heavily calcified aorta that was not itself stenotic.  The common iliac arteries were heavily diseased proximally bilaterally worse on the left than the right.  The right side appeared to have about a 70% stenosis in the left side appeared to have about an 85 to 90% stenosis.  Both external iliac arteries were patent.  The left common femoral artery at the area accessed was patent, but below this in the distal common femoral artery and proximal SFA was at least a 70 to 80% stenosis. I then crossed the aortic bifurcation and advanced to the right femoral head. Selective right lower extremity angiogram was then performed. This demonstrated a heavily diseased right common femoral artery with nearly occlusive stenosis of greater than 90% tracking down into the proximal portions of both the SFA and profunda femoris arteries.  The previously placed SFA and popliteal stents were surprisingly patent without significant recurrent stenosis.  Below the stents however there was significant disease.  There was about a 60% stenosis in the mid popliteal artery below the previously placed stents.  There is then about an 80% stenosis in the tibioperoneal trunk.  The posterior tibial artery was then the dominant runoff distally and was continuous to the foot.  The peroneal artery was small but did provide some additional runoff distally.  The anterior tibial artery was chronically occluded without distal reconstitution.  The patient will  clearly need a right femoral endarterectomy, kissing balloon expandable iliac stent placement, right popliteal and tibial intervention as well as potentially a left femoral endarterectomy.  This will be a large hybrid procedure in the operating room and no intervention was appropriate for today. I elected to terminate the procedure. The sheath was removed and StarClose closure device was deployed in the left femoral artery with excellent hemostatic result. The patient was taken to the recovery room in stable condition having tolerated the procedure well.  Findings:               Aortogram:  This demonstrated normal renal arteries, heavily calcified aorta that was not itself stenotic.  The common iliac arteries were heavily diseased proximally bilaterally worse on the left than the right.  The right side appeared to have about a 70% stenosis in the left side appeared to have about an 85 to 90% stenosis.  Both external iliac arteries were patent.  The left common femoral artery at the area accessed was patent, but below this in the distal common femoral artery and proximal SFA was at least a 70 to 80% stenosis.              Right Lower Extremity:  This demonstrated a heavily diseased right common femoral artery with nearly occlusive stenosis of greater than 90% tracking down into the proximal portions of both the SFA and profunda femoris arteries.  The previously placed SFA and popliteal stents were surprisingly patent without significant recurrent stenosis.  Below the stents however there was significant disease.  There was about a 60% stenosis in the mid popliteal artery below the previously placed stents.  There is then about an 80% stenosis in the tibioperoneal trunk.  The posterior tibial artery was then the dominant runoff distally and was continuous to the foot.  The peroneal artery was small but did provide some additional runoff distally.  The anterior tibial artery was chronically occluded without distal  reconstitution.   Disposition: Patient was taken to the recovery room in stable condition having tolerated the procedure well.  Complications: None  Festus Barren 11/11/2022 3:25 PM   This note was created with Dragon Medical transcription system. Any errors in dictation are purely unintentional.

## 2022-11-11 NOTE — Hospital Course (Addendum)
Taken from H&P.   Manuel [name] is a 77 y.o. male with medical history significant of  insulin-dependent DM, HTN, CAD, PAD s/p angioplasty with stent 05/14/2021 presenting with right lower extremity ischemia and ulcer formation with gangrenous toe.  Presented to the ER afebrile, hemodynamically stable. White count 11.2, hemoglobin 15.2, platelets 303, lactate 1.2, creatinine 0.9. T. bili 1.5. Plain films of the right foot with no signs of osteomyelitis. Dr. [name] with vascular surgery initially consulted with recommendation for heparin drip. Dr. [name] with podiatry also consulted for further evaluation.   7/22: Vital stable.  Going for lower extremity angiography and possible angioplasty with vascular today.  CRP elevated at 10.1, decreased prealbumin at 15 and normal ESR.  Preliminary blood cultures negative.  Continue current broad-spectrum antibiotics.  Found to have very extensive disease and most likely will required bilateral femoral endarterectomies and bilateral iliac stents.  Vascular is looking for a OR time for this extensive procedure which will require 4 to 5 hours.  7/23: Vital and labs stable.  Overnight some shortness of breath and crackles.  One-time 40 mg of IV Lasix was given.  Chest x-ray ordered but patient refused. Patient had a choking spell on meds this morning, continued to feel something sticking in his chest.  No hypoxia.  Chest x-ray with diffuse interstitial prominence and areas of nodularity throughout the both lungs.  Differential include atypical inflammatory process versus atypical pulmonary edema.  Ordered 1 dose of IV Lasix Going for his extensive vascular procedure tomorrow.  7/24: Mildly elevated blood pressure.  BNP elevated at 932, surgery got canceled for concern of acute on chronic heart failure and patient now need cardiology clearance before proceeding. Family concern of suicidal thoughts, psych was consulted.  7/25: Hemodynamically stable.  Will  echocardiogram with EF of 25 to 30% with some regional wall motion abnormalities.  Likely weak going for vascular surgery tomorrow, pending final recommendations from cardiology. Psychiatry with no suicidal thoughts.  7/26: Hemodynamically stable.  Going for vascular procedure today.  7/27: Vital stable.  Patient underwent extensive vascular procedure which involved bilateral lower extremity endarterectomies and multiple stents placement.  Pulses present with Doppler this morning.  Significant blood loss during the procedure, hemoglobin decreased to 11.5 this morning, baseline of 13.  Started on supplement.  7/28: Vital stable.  Labs with increase in creatinine to 1.31-holding Lasix today.  Hemoglobin further decreased to 9.4.  Likely be going for toe amputation tomorrow.  Arterial line removed.  7/29: Going for toes removal today.  Slight worsening of renal function today, holding diuretic and giving some IV fluid as patient is n.p.o.  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  Hemoglobin decreased to 7.8 postsurgically. Patient will be going home with 2 wound VAC in place in his groin areas from vascular surgery and they will remove it in the clinic on 11/25/2022.  Repeat PT and OT evaluation pending but he would like to go home with home health.  If remains stable likely can be discharged tomorrow. Heparin infusion is being discontinued and patient will be started on aspirin and Plavix per vascular surgery.  7/31: Hemodynamically stable.  Physical therapy recommendation remained for home health, which was ordered.  Unasyn can be switched to Augmentin on discharge for 1 more week per podiatry.  Appears volume up again today, worsening renal function and lower extremity edema, remained on room air.  Restarting IV Lasix.  Holding discharge until volume status improves.

## 2022-11-11 NOTE — Interval H&P Note (Signed)
History and Physical Interval Note:  11/11/2022 2:30 PM  Manuel [name]  has presented today for surgery, with the diagnosis of Chronic RIGHT lower extremity Ischemia.  The various methods of treatment have been discussed with the patient and family. After consideration of risks, benefits and other options for treatment, the patient has consented to  Procedure(s): Lower Extremity Angiography (Right) as a surgical intervention.  The patient's history has been reviewed, patient examined, no change in status, stable for surgery.  I have reviewed the patient's chart and labs.  Questions were answered to the patient's satisfaction.     Festus Barren

## 2022-11-12 ENCOUNTER — Inpatient Hospital Stay: Payer: Medicare Other | Admitting: Urgent Care

## 2022-11-12 ENCOUNTER — Encounter: Payer: Self-pay | Admitting: Vascular Surgery

## 2022-11-12 ENCOUNTER — Inpatient Hospital Stay: Payer: Medicare Other

## 2022-11-12 ENCOUNTER — Inpatient Hospital Stay: Admit: 2022-11-12 | Payer: Medicare Other

## 2022-11-12 DIAGNOSIS — I5022 Chronic systolic (congestive) heart failure: Secondary | ICD-10-CM | POA: Insufficient documentation

## 2022-11-12 DIAGNOSIS — I998 Other disorder of circulatory system: Secondary | ICD-10-CM | POA: Diagnosis not present

## 2022-11-12 DIAGNOSIS — I5043 Acute on chronic combined systolic (congestive) and diastolic (congestive) heart failure: Secondary | ICD-10-CM | POA: Insufficient documentation

## 2022-11-12 DIAGNOSIS — L97909 Non-pressure chronic ulcer of unspecified part of unspecified lower leg with unspecified severity: Secondary | ICD-10-CM | POA: Diagnosis not present

## 2022-11-12 DIAGNOSIS — E1142 Type 2 diabetes mellitus with diabetic polyneuropathy: Secondary | ICD-10-CM | POA: Diagnosis not present

## 2022-11-12 DIAGNOSIS — I2510 Atherosclerotic heart disease of native coronary artery without angina pectoris: Secondary | ICD-10-CM | POA: Diagnosis not present

## 2022-11-12 DIAGNOSIS — I5023 Acute on chronic systolic (congestive) heart failure: Secondary | ICD-10-CM | POA: Insufficient documentation

## 2022-11-12 DIAGNOSIS — I5042 Chronic combined systolic (congestive) and diastolic (congestive) heart failure: Secondary | ICD-10-CM | POA: Insufficient documentation

## 2022-11-12 LAB — CULTURE, BLOOD (ROUTINE X 2)
Culture: NO GROWTH
Special Requests: ADEQUATE

## 2022-11-12 LAB — GLUCOSE, CAPILLARY
Glucose-Capillary: 139 mg/dL — ABNORMAL HIGH (ref 70–99)
Glucose-Capillary: 161 mg/dL — ABNORMAL HIGH (ref 70–99)
Glucose-Capillary: 157 mg/dL — ABNORMAL HIGH (ref 70–99)
Glucose-Capillary: 120 mg/dL — ABNORMAL HIGH (ref 70–99)

## 2022-11-12 LAB — HEPARIN LEVEL (UNFRACTIONATED)
Heparin Unfractionated: 0.29 IU/mL — ABNORMAL LOW (ref 0.30–0.70)
Heparin Unfractionated: 0.32 IU/mL (ref 0.30–0.70)
Heparin Unfractionated: 0.38 IU/mL (ref 0.30–0.70)
Heparin Unfractionated: 0.44 IU/mL (ref 0.30–0.70)

## 2022-11-12 LAB — MAGNESIUM: Magnesium: 2.1 mg/dL (ref 1.7–2.4)

## 2022-11-12 LAB — CREATININE, SERUM
Creatinine, Ser: 0.87 mg/dL (ref 0.61–1.24)
GFR, Estimated: >60 mL/min (ref >60)

## 2022-11-12 LAB — CBC
HCT: 36.3 % — ABNORMAL LOW (ref 39.0–52.0)
Hemoglobin: 12.8 g/dL — ABNORMAL LOW (ref 13.0–17.0)
MCH: 30.5 pg (ref 26.0–34.0)
MCHC: 35.3 g/dL (ref 30.0–36.0)
MCV: 86.4 fL (ref 80.0–100.0)
Platelets: 298 10*3/uL (ref 150–400)
RBC: 4.2 MIL/uL — ABNORMAL LOW (ref 4.22–5.81)
RDW: 13.2 % (ref 11.5–15.5)
WBC: 9.7 10*3/uL (ref 4.0–10.5)
nRBC: 0 % (ref 0.0–0.2)

## 2022-11-12 LAB — TYPE AND SCREEN
ABO/RH(D): A POS
Antibody Screen: NEGATIVE

## 2022-11-12 LAB — ABO/RH: ABO/RH(D): A POS

## 2022-11-12 LAB — HEMOGLOBIN A1C
Hgb A1c MFr Bld: 6.9 % — ABNORMAL HIGH (ref 4.8–5.6)
Mean Plasma Glucose: 151 mg/dL

## 2022-11-12 LAB — PHOSPHORUS: Phosphorus: 3.1 mg/dL (ref 2.5–4.6)

## 2022-11-12 MED ORDER — CHLORHEXIDINE GLUCONATE 4 % EX SOLN
60.0000 mL | Freq: Once | CUTANEOUS | Status: AC
  Administered 2022-11-12: 4 via TOPICAL

## 2022-11-12 MED ORDER — FUROSEMIDE 10 MG/ML IJ SOLN
40.0000 mg | Freq: Once | INTRAMUSCULAR | Status: AC
  Administered 2022-11-12: 40 mg via INTRAVENOUS
  Filled 2022-11-12: qty 4

## 2022-11-12 MED ORDER — CHLORHEXIDINE GLUCONATE 4 % EX SOLN
60.0000 mL | Freq: Once | CUTANEOUS | Status: AC
  Administered 2022-11-13: 4 via TOPICAL

## 2022-11-12 MED ORDER — GABAPENTIN 300 MG PO CAPS
800.0000 mg | ORAL_CAPSULE | Freq: Two times a day (BID) | ORAL | Status: DC
  Administered 2022-11-12 – 2022-11-26 (×28): 800 mg via ORAL
  Filled 2022-11-12 (×28): qty 2

## 2022-11-12 NOTE — Progress Notes (Signed)
Vein and vascular service and hospitalist have been notified about the patient's wife request for the patient to have two beers per day. Awaiting for response or order for current request.

## 2022-11-12 NOTE — Progress Notes (Signed)
Progress Note   Patient: Manuel [name]:[id number] DOB: 06-18-1945 DOA: 11/10/2022     2 DOS: the patient was seen and examined on 11/12/2022   Brief hospital course: Taken from H&P.   Manuel [name] is a 77 y.o. male with medical history significant of  insulin-dependent DM, HTN, CAD, PAD s/p angioplasty with stent 05/14/2021 presenting with right lower extremity ischemia and ulcer formation with gangrenous toe.  Presented to the ER afebrile, hemodynamically stable. White count 11.2, hemoglobin 15.2, platelets 303, lactate 1.2, creatinine 0.9. T. bili 1.5. Plain films of the right foot with no signs of osteomyelitis. Dr. [name] with vascular surgery initially consulted with recommendation for heparin drip. Dr. [name] with podiatry also consulted for further evaluation.   7/22: Vital stable.  Going for lower extremity angiography and possible angioplasty with vascular today.  CRP elevated at 10.1, decreased prealbumin at 15 and normal ESR.  Preliminary blood cultures negative.  Continue current broad-spectrum antibiotics.  Found to have very extensive disease and most likely will required bilateral femoral endarterectomies and bilateral iliac stents.  Vascular is looking for a OR time for this extensive procedure which will require 4 to 5 hours.  7/23: Vital and labs stable.  Overnight some shortness of breath and crackles.  One-time 40 mg of IV Lasix was given.  Chest x-ray ordered but patient refused. Patient had a choking spell on meds this morning, continued to feel something sticking in his chest.  No hypoxia.  Chest x-ray Going for his extensive vascular procedure tomorrow.  Assessment and Plan: * Ischemic ulcer of lower extremity (HCC) Worsening right lower extremity redness swelling and pain over the past 2 weeks in the setting of baseline limb ischemia status post angioplasty and stenting January 2023 Patient reports compliance with Plavix and statin therapy Case reviewed with  Dr. [name] with vascular surgery Started on heparin drip in the ER Noted worsening ulcer formation across multiple digits of the foot with concern for gangrene, going for lower extremity angiography and possible angioplasty today. Vascular procedure with extensive peripheral vascular disease, will be going for bilateral endarterectomies and bilateral iliac stent placement with vascular surgery tomorrow. -Continue empiric osteomyelitic/cellulitic coverage with cefepime, Flagyl, vancomycin -Podiatry will decide about the appropriate procedure after vascular    Diabetic polyneuropathy associated with type 2 diabetes mellitus (HCC) Cont neurontin    Coronary artery disease with history of myocardial infarction without history of CABG Followed by Dr. [name] outpt  CAD s/p PCI to [facility] with overlapping 3 mm Xience stents of 23 mm and 12 mm length in 2011  No active chest pain On plavix monotherapy and statin chronically  On heparin drip in the setting of right lower extremity ischemia Follow  Hypertension BP stable  Titrate home regimen    Diabetes mellitus type 2, insulin dependent (HCC) SSI   Alcohol abuse 3-4 beer daily drinker + mixed drinks  Will place on CIWA protocol  Follow   Mixed hyperlipidemia Continue statin  Acute on chronic combined systolic and diastolic CHF (congestive heart failure) (HCC) According to a prior echo done in 2020, EF of 40 to 45%. Patient with concern of shortness of breath, mild hypoxia and chest x-ray with bilateral opacities. -Check BNP -1 dose of IV Lasix -Repeat echocardiogram   Subjective: Patient was having significant cough and shortness of breath.  Little while ago he had a choking spell while taking his meds.  Physical Exam: Vitals:   11/12/22 8119 11/12/22 0500 11/12/22 1478 11/12/22 2956  BP:   (!) 152/77 (!) 149/75  Pulse: 85  84 87  Resp:   (!) 24 20  Temp:   99.1 F (37.3 C) 98.4 F (36.9 C)  TempSrc:   Oral   SpO2: 92%   91% 93%  Weight:  90 kg    Height:       General.  Frail elderly man, in no acute distress. Pulmonary.  Lungs clear bilaterally, normal respiratory effort. CV.  Regular rate and rhythm, no JVD, rub or murmur. Abdomen.  Soft, nontender, nondistended, BS positive. CNS.  Alert and oriented .  No focal neurologic deficit. Extremities.  L LE trace edema and mild erythema.  Pedal pulses not palpable. Psychiatry.  Judgment and insight appears normal.    Data Reviewed: Prior data reviewed  Family Communication: Discussed with wife at bedside  Disposition: Status is: Inpatient Remains inpatient appropriate because: Severity of illness  Planned Discharge Destination:  To be determined  Time spent: 50 minutes  This record has been created using Conservation officer, historic buildings. Errors have been sought and corrected,but may not always be located. Such creation errors do not reflect on the standard of care.   Author: Arnetha Courser, MD 11/12/2022 3:48 PM  For on call review [email or web address].

## 2022-11-12 NOTE — Plan of Care (Signed)
Shift assessment notable for bilateral lung bases + crackles. Provider notified with 3L O2 requirement and assessment finding. Provider ordered Chest X-ray but patient refused. Educated patient on reason for test but patient continued to refuse stating, he does not want the X-ray. During this active listening, patient reported if he were to die now, he is ready to go. Care nurse informed patient that he is a listed as full code currently and per order, we would resuscitate him. Patient stated that "please don't, I do not want to be resuscitated". On call provider notified, awaiting provider to come talk to patient and place updated order but per night covering provider, might not be able to come up for conversation and ask if this can be passed on to AM shift and primary team.   PRN ativan given X1 overnight for anxiety and elevated BP with good response.   This care RN was able to titrate down to O2 to 2L but this morning patient had removed his O2 and when asked to place it back, he refused it stating, he doesn't need it and he is fine. SpO2 91%, patient not in any acute distress.    Problem: Education: Goal: Knowledge of General Education information will improve Description: Including pain rating scale, medication(s)/side effects and non-pharmacologic comfort measures Outcome: Progressing   Problem: Clinical Measurements: Goal: Ability to maintain clinical measurements within normal limits will improve Outcome: Progressing Goal: Respiratory complications will improve Outcome: Progressing   Problem: Activity: Goal: Risk for activity intolerance will decrease Outcome: Progressing   Problem: Nutrition: Goal: Adequate nutrition will be maintained Outcome: Progressing   Problem: Coping: Goal: Level of anxiety will decrease Outcome: Progressing   Problem: Pain Managment: Goal: General experience of comfort will improve Outcome: Progressing   Problem: Safety: Goal: Ability to remain  free from injury will improve Outcome: Progressing

## 2022-11-12 NOTE — Assessment & Plan Note (Addendum)
According to a prior echo done in 2020, EF of 40 to 45%. Patient with concern of shortness of breath, mild hypoxia and chest x-ray with bilateral opacities.  BNP elevated at 932. Repeat echocardiogram with further decrease of EF to 20 to 25% with regional wall motion abnormalities. Becoming volume overloaded again with slight worsening of renal function as Lasix was held for the past couple of days. No cardiology follow-up for the past 3 days -Restarting IV Lasix at 40 mg twice daily. -Daily weight and BMP -Strict intake and output

## 2022-11-12 NOTE — Progress Notes (Addendum)
       CROSS COVER NOTE  NAME: Manuel [name] MRN: [id number] DOB : Sep 18, 1945    Concern as stated by nurse / staff   FYI pt was placed on 3L of O2 at shift change because she was c/o some SOB. He is still lingering at 3L SpO2 92%. Lung sound + crackles bilateral bases. do we need to do anything else? He was room air until he went down for angiography in the afternoon and was placed on O2 there. Not sure why he did not have it on when he return back during day shift.      Pertinent findings on chart review: Last progress note reviewed.    11/11/2022    8:06 PM 11/11/2022    5:09 PM 11/11/2022    4:15 PM  Vitals with BMI  Systolic 161 135 409  Diastolic 94 82 82  Pulse 97 92 91   3L SpO2 92%  Assessment and  Interventions   Assessment: Hypoxia, possible mild fluid overload following procedure/angiography  Plan: Portable chest x-ray--->patient refused Trial of Lasix 40 mg IV x 1 Continue supplemental oxygen and continue to monitor

## 2022-11-12 NOTE — Consult Note (Signed)
ANTICOAGULATION CONSULT NOTE  Pharmacy Consult for Heparin Infusion Indication: chest pain/ACS  No Known Allergies  Patient Measurements: Height: 5\' 8"  (172.7 cm) Weight: 90 kg (198 lb 6.6 oz) IBW/kg (Calculated) : 68.4 Heparin Dosing Weight: 85.7 kg  Vital Signs: Temp: 98.4 F (36.9 C) (07/23 2032) Temp Source: Oral (07/23 2032) BP: 154/78 (07/23 2032) Pulse Rate: 91 (07/23 2032)  Labs: Recent Labs    11/10/22 0954 11/10/22 1124 11/10/22 1931 11/11/22 0407 11/11/22 1357 11/12/22 0521 11/12/22 1426 11/12/22 2229  HGB 15.2  --   --  12.5*  --  12.8*  --   --   HCT 43.5  --   --  35.6*  --  36.3*  --   --   PLT 303  --   --  305  --  298  --   --   APTT  --  32  --   --   --   --   --   --   LABPROT  --  14.1  --   --   --   --   --   --   INR  --  1.1  --   --   --   --   --   --   HEPARINUNFRC  --   --    < > 0.19*   < > 0.29* 0.38 0.44  CREATININE 0.90  --   --   --   --  0.87  --   --    < > = values in this interval not displayed.    Estimated Creatinine Clearance: 75 mL/min (by C-G formula based on SCr of 0.87 mg/dL).   Medical History: Past Medical History:  Diagnosis Date   Allergies    Arthritis    Diabetes mellitus without complication (HCC)    Gout    Hard of hearing    Hyperlipidemia    Hypertension    Myocardial infarction [facility]) 2008   treated here at [facility] and transferred to [place] for stent placement   Neuropathy    knees down   Peripheral vascular disease (HCC)    Sleep apnea    "in the past"   Status post primary angioplasty with coronary stent    Wears dentures    full upper and lower    Assessment: Manuel [name] is a 77 y.o. male with past medical history of PVD s/p mechanical thrombectomy of right lower extremity in February 2023, CAD, T2DM, HLD, neuropathy. He was on a DOAC previously, but is not on anticoagulant therapy currently. He is here with pain to the right foot and a black toe on the right foot starting approximately 3  days ago. Vascular surgery consulted and planning angiography 7/22. Pharmacy has been consulted to initiate and manage heparin infusion.   Baseline Labs: aPTT 32, INR 1.1, Hgb 15.2, Plt 303  Goal of Therapy:  Heparin level 0.3-0.7 units/ml Monitor platelets by anticoagulation protocol: Yes   Date Time HL Rate/Comment  7/21 1931 0.12 1400/subtherapeutic,>1700 7/22 0407 0.19 1700/subtherapeutic >1950 7/22 1357 0.37 Therapeutic x 1 7/22 2349 0.32 Therapeutic x 2 7/23 0521 0.29 Subtherapeutic 7/23 1426 0.38 Therapeutic x 1 7/23 2229 0.44 Therapeutic x 2  Plan:  Anti-Xa level is therapeutic x 2 Continue heparin infusion at 2100 units/hr Recheck HL daily w/ AM labs while therapeutic Daily CBC while on heparin  Otelia Sergeant, PharmD, [facility] 11/12/2022 11:44 PM

## 2022-11-12 NOTE — Consult Note (Signed)
ANTICOAGULATION CONSULT NOTE  Pharmacy Consult for Heparin Infusion Indication: chest pain/ACS  No Known Allergies  Patient Measurements: Height: 5\' 8"  (172.7 cm) Weight: 90 kg (198 lb 6.6 oz) IBW/kg (Calculated) : 68.4 Heparin Dosing Weight: 85.7 kg  Vital Signs: Temp: 98.4 F (36.9 C) (07/23 0744) Temp Source: Oral (07/23 0527) BP: 149/75 (07/23 0744) Pulse Rate: 87 (07/23 0744)  Labs: Recent Labs    11/10/22 0954 11/10/22 1124 11/10/22 1931 11/11/22 0407 11/11/22 1357 11/11/22 2349 11/12/22 0521 11/12/22 1426  HGB 15.2  --   --  12.5*  --   --  12.8*  --   HCT 43.5  --   --  35.6*  --   --  36.3*  --   PLT 303  --   --  305  --   --  298  --   APTT  --  32  --   --   --   --   --   --   LABPROT  --  14.1  --   --   --   --   --   --   INR  --  1.1  --   --   --   --   --   --   HEPARINUNFRC  --   --    < > 0.19*   < > 0.32 0.29* 0.38  CREATININE 0.90  --   --   --   --   --  0.87  --    < > = values in this interval not displayed.    Estimated Creatinine Clearance: 75 mL/min (by C-G formula based on SCr of 0.87 mg/dL).   Medical History: Past Medical History:  Diagnosis Date   Allergies    Arthritis    Diabetes mellitus without complication (HCC)    Gout    Hard of hearing    Hyperlipidemia    Hypertension    Myocardial infarction [facility]) 2008   treated here at [facility] and transferred to [place] for stent placement   Neuropathy    knees down   Peripheral vascular disease (HCC)    Sleep apnea    "in the past"   Status post primary angioplasty with coronary stent    Wears dentures    full upper and lower    Assessment: Manuel [name] is a 77 y.o. male with past medical history of PVD s/p mechanical thrombectomy of right lower extremity in February 2023, CAD, T2DM, HLD, neuropathy. He was on a DOAC previously, but is not on anticoagulant therapy currently. He is here with pain to the right foot and a black toe on the right foot starting approximately 3  days ago. Vascular surgery consulted and planning angiography 7/22. Pharmacy has been consulted to initiate and manage heparin infusion.   Baseline Labs: aPTT 32, INR 1.1, Hgb 15.2, Plt 303  Goal of Therapy:  Heparin level 0.3-0.7 units/ml Monitor platelets by anticoagulation protocol: Yes   Date Time HL Rate/Comment  7/21 1931 0.12 1400/subtherapeutic,>1700 7/22 0407 0.19 1700/subtherapeutic >1950 7/22 1357 0.37 Therapeutic x 1 7/22 2349 0.32 Therapeutic x 2 7/23 0521 0.29 Subtherapeutic 7/23 1426 0.38 Therapeutic x 1  Plan:  Anti-Xa level is therapeutic x 1 Continue heparin infusion at 2100 units/hr Recheck HL in 8 hrs to confirm Daily CBC while on heparin  [name], PharmD 11/12/2022 2:54 PM

## 2022-11-12 NOTE — Assessment & Plan Note (Addendum)
Worsening right lower extremity redness swelling and pain over the past 2 weeks in the setting of baseline limb ischemia status post angioplasty and stenting January 2023.Noted worsening ulcer formation across multiple digits of the foot with concern for gangrene Patient reports compliance with Plavix and statin therapy Started on heparin drip in the ER  Vascular procedure with extensive peripheral vascular disease, second procedure for bilateral endarterectomies and bilateral iliac stent placement with vascular surgery was done yesterday, tolerated the procedure well.  Palpable pulses today. 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 -Repeat PT and OT evaluation -Patient currently have 2 wound VAC in place in his both groin areas and will be discharging with them. -Heparin was discontinued yesterday and he was started on aspirin and Plavix per vascular surgery

## 2022-11-12 NOTE — Plan of Care (Signed)
PRN pain meds given for pain meds. Urinal used for voiding. The patient had a BM. The patient is currently on a heparin drip. Call bell at reach. Family at the bedside.  Problem: Education: Goal: Knowledge of General Education information will improve Description: Including pain rating scale, medication(s)/side effects and non-pharmacologic comfort measures Outcome: Not Progressing   Problem: Health Behavior/Discharge Planning: Goal: Ability to manage health-related needs will improve Outcome: Not Progressing   Problem: Clinical Measurements: Goal: Ability to maintain clinical measurements within normal limits will improve Outcome: Not Progressing Goal: Will remain free from infection Outcome: Not Progressing Goal: Diagnostic test results will improve Outcome: Not Progressing Goal: Respiratory complications will improve Outcome: Not Progressing Goal: Cardiovascular complication will be avoided Outcome: Not Progressing   Problem: Activity: Goal: Risk for activity intolerance will decrease Outcome: Not Progressing   Problem: Nutrition: Goal: Adequate nutrition will be maintained Outcome: Not Progressing   Problem: Coping: Goal: Level of anxiety will decrease Outcome: Not Progressing   Problem: Elimination: Goal: Will not experience complications related to bowel motility Outcome: Not Progressing Goal: Will not experience complications related to urinary retention Outcome: Not Progressing   Problem: Pain Managment: Goal: General experience of comfort will improve Outcome: Not Progressing   Problem: Safety: Goal: Ability to remain free from injury will improve Outcome: Not Progressing   Problem: Skin Integrity: Goal: Risk for impaired skin integrity will decrease Outcome: Not Progressing   Problem: Education: Goal: Ability to describe self-care measures that may prevent or decrease complications (Diabetes Survival Skills Education) will improve Outcome: Not  Progressing Goal: Individualized Educational Video(s) Outcome: Not Progressing   Problem: Coping: Goal: Ability to adjust to condition or change in health will improve Outcome: Not Progressing   Problem: Fluid Volume: Goal: Ability to maintain a balanced intake and output will improve Outcome: Not Progressing   Problem: Health Behavior/Discharge Planning: Goal: Ability to identify and utilize available resources and services will improve Outcome: Not Progressing Goal: Ability to manage health-related needs will improve Outcome: Not Progressing   Problem: Metabolic: Goal: Ability to maintain appropriate glucose levels will improve Outcome: Not Progressing   Problem: Nutritional: Goal: Maintenance of adequate nutrition will improve Outcome: Not Progressing Goal: Progress toward achieving an optimal weight will improve Outcome: Not Progressing

## 2022-11-12 NOTE — Consult Note (Signed)
ANTICOAGULATION CONSULT NOTE  Pharmacy Consult for Heparin Infusion Indication: chest pain/ACS  No Known Allergies  Patient Measurements: Height: 5\' 8"  (172.7 cm) Weight: 86.2 kg (190 lb) IBW/kg (Calculated) : 68.4 Heparin Dosing Weight: 85.7 kg  Vital Signs: Temp: 98.6 F (37 C) (07/22 2006) Temp Source: Oral (07/22 2006) BP: 161/94 (07/22 2006) Pulse Rate: 97 (07/22 2006)  Labs: Recent Labs    11/10/22 0954 11/10/22 1124 11/10/22 1931 11/11/22 0407 11/11/22 1357 11/11/22 2349  HGB 15.2  --   --  12.5*  --   --   HCT 43.5  --   --  35.6*  --   --   PLT 303  --   --  305  --   --   APTT  --  32  --   --   --   --   LABPROT  --  14.1  --   --   --   --   INR  --  1.1  --   --   --   --   HEPARINUNFRC  --   --    < > 0.19* 0.37 0.32  CREATININE 0.90  --   --   --   --   --    < > = values in this interval not displayed.    Estimated Creatinine Clearance: 71.1 mL/min (by C-G formula based on SCr of 0.9 mg/dL).   Medical History: Past Medical History:  Diagnosis Date   Allergies    Arthritis    Diabetes mellitus without complication (HCC)    Gout    Hard of hearing    Hyperlipidemia    Hypertension    Myocardial infarction [facility]) 2008   treated here at [facility] and transferred to [place] for stent placement   Neuropathy    knees down   Peripheral vascular disease (HCC)    Sleep apnea    "in the past"   Status post primary angioplasty with coronary stent    Wears dentures    full upper and lower    Assessment: Manuel [name] is a 77 y.o. male with past medical history of PVD s/p mechanical thrombectomy of right lower extremity in February 2023, CAD, T2DM, HLD, neuropathy. He was on a DOAC previously, but is not on anticoagulant therapy currently. He is here with pain to the right foot and a black toe on the right foot starting approximately 3 days ago. Vascular surgery consulted and planning angiography 7/22. Pharmacy has been consulted to initiate and manage  heparin infusion.   Baseline Labs: aPTT 32, INR 1.1, Hgb 15.2, Plt 303  Goal of Therapy:  Heparin level 0.3-0.7 units/ml Monitor platelets by anticoagulation protocol: Yes   Date Time HL Rate/Comment  7/21 1931 0.12 1400/subtherapeutic,>1700 7/22 0407 0.19 1700/subtherapeutic >1950 7/22 1357 0.37 Therapeutic x 1 7/22 2349 0.32 Therapeutic x 2  Plan:  HL therapeutic x 2 Continue heparin infusion at 1950 units/hr Check anti-Xa level daily w/ AM labs while therapeutic Daily CBC while on heparin  Otelia Sergeant, PharmD, [facility] 11/12/2022 12:32 AM

## 2022-11-12 NOTE — Discharge Instructions (Signed)

## 2022-11-12 NOTE — TOC CM/SW Note (Signed)
Substance abuse resources added to AVS per MD order

## 2022-11-12 NOTE — Progress Notes (Signed)
1 Day Post-Op   Subjective/Chief Complaint: Patient seen.  Having some pain in his right leg around the ankle area.   Objective: Vital signs in last 24 hours: Temp:  [98.3 F (36.8 C)-99.1 F (37.3 C)] 98.4 F (36.9 C) (07/23 0744) Pulse Rate:  [0-101] 87 (07/23 0744) Resp:  [15-27] 20 (07/23 0744) BP: (135-165)/(75-105) 149/75 (07/23 0744) SpO2:  [83 %-93 %] 93 % (07/23 0744) Weight:  [90 kg] 90 kg (07/23 0500) Last BM Date : 11/10/22  Intake/Output from previous day: 07/22 0701 - 07/23 0700 In: 1107 [I.V.:457.8; IV Piggyback:649.2] Out: 2500 [Urine:2500] Intake/Output this shift: No intake/output data recorded.  Progressive gangrenous changes in the right fourth toe.  Full-thickness ulcerative area on the medial aspect of the right fifth toe.  Also a small ulcerative area on the dorsomedial right second toe.  Pedal pulses are not clearly palpable on the right foot.  Lab Results:  Recent Labs    11/11/22 0407 11/12/22 0521  WBC 10.2 9.7  HGB 12.5* 12.8*  HCT 35.6* 36.3*  PLT 305 298   BMET Recent Labs    11/10/22 0954 11/12/22 0521  NA 132*  --   K 4.6  --   CL 96*  --   CO2 25  --   GLUCOSE 121*  --   BUN 19  --   CREATININE 0.90 0.87  CALCIUM 9.2  --    PT/INR Recent Labs    11/10/22 1124  LABPROT 14.1  INR 1.1   ABG No results for input(s): "PHART", "HCO3" in the last 72 hours.  Invalid input(s): "PCO2", "PO2"  Studies/Results: DG Chest Port 1 View  Result Date: 11/12/2022 CLINICAL DATA:  Shortness of breath EXAM: PORTABLE CHEST 1 VIEW COMPARISON:  Prior chest x-ray 11/09/2021 FINDINGS: Diffuse irregular interstitial prominence with areas of nodularity throughout the lungs. Small bilateral pleural effusions and associated bibasilar atelectasis. Cardiac and mediastinal contours are within normal limits. Atherosclerotic calcifications are visible in the transverse aorta. No pneumothorax. No acute osseous abnormality. Bilateral glenohumeral joint  osteoarthritis. IMPRESSION: Diffuse interstitial prominence with areas of nodularity throughout both lungs. Differential considerations include an acute atypical infectious/inflammatory process such as viral pneumonia versus atypical pulmonary edema. Trace bilateral pleural effusions and associated bibasilar atelectasis. Normal heart size. Electronically Signed   By: Malachy Moan M.D.   On: 11/12/2022 13:39   PERIPHERAL VASCULAR CATHETERIZATION  Result Date: 11/11/2022 See surgical note for result.   Anti-infectives: Anti-infectives (From admission, onward)    Start     Dose/Rate Route Frequency Ordered Stop   11/11/22 1338  ceFAZolin (ANCEF) IVPB 2g/100 mL premix  Status:  Discontinued        2 g 200 mL/hr over 30 Minutes Intravenous 30 min pre-op 11/11/22 1338 11/11/22 1608   11/11/22 1200  vancomycin (VANCOREADY) IVPB 1750 mg/350 mL        1,750 mg 175 mL/hr over 120 Minutes Intravenous Every 24 hours 11/10/22 1142     11/10/22 1400  ceFEPIme (MAXIPIME) 2 g in sodium chloride 0.9 % 100 mL IVPB        2 g 200 mL/hr over 30 Minutes Intravenous Every 8 hours 11/10/22 1129     11/10/22 1130  metroNIDAZOLE (FLAGYL) IVPB 500 mg        500 mg 100 mL/hr over 60 Minutes Intravenous Every 12 hours 11/10/22 1119 11/17/22 0959   11/10/22 1030  vancomycin (VANCOREADY) IVPB 2000 mg/400 mL        2,000 mg 200  mL/hr over 120 Minutes Intravenous  Once 11/10/22 1027 11/10/22 1331   11/10/22 1000  piperacillin-tazobactam (ZOSYN) IVPB 3.375 g        3.375 g 100 mL/hr over 30 Minutes Intravenous  Once 11/10/22 0949 11/10/22 1054       Assessment/Plan: s/p Procedure(s): Lower Extremity Angiography (Right) Assessment: 1.  Gangrenous changes toes right foot.  2.  Diabetes with associated neuropathy. 3.  Severe peripheral vascular disease.  Plan: Gauze applied between the toes on the right foot followed by Kerlix dressing.  Patient scheduled for open endarterectomy tomorrow.  Discussed with the  patient that he will need at least fourth and fifth ray resection following revascularization but could potentially end up with transmetatarsal amputation.  This will depend on his vascular procedure tomorrow.  Plan for surgery later in the week.  LOS: 2 days    Ricci Barker 11/12/2022

## 2022-11-12 NOTE — Consult Note (Signed)
ANTICOAGULATION CONSULT NOTE  Pharmacy Consult for Heparin Infusion Indication: chest pain/ACS  No Known Allergies  Patient Measurements: Height: 5\' 8"  (172.7 cm) Weight: 90 kg (198 lb 6.6 oz) IBW/kg (Calculated) : 68.4 Heparin Dosing Weight: 85.7 kg  Vital Signs: Temp: 99.1 F (37.3 C) (07/23 0527) Temp Source: Oral (07/23 0527) BP: 152/77 (07/23 0527) Pulse Rate: 84 (07/23 0527)  Labs: Recent Labs    11/10/22 0954 11/10/22 1124 11/10/22 1931 11/11/22 0407 11/11/22 1357 11/11/22 2349 11/12/22 0521  HGB 15.2  --   --  12.5*  --   --  12.8*  HCT 43.5  --   --  35.6*  --   --  36.3*  PLT 303  --   --  305  --   --  298  APTT  --  32  --   --   --   --   --   LABPROT  --  14.1  --   --   --   --   --   INR  --  1.1  --   --   --   --   --   HEPARINUNFRC  --   --    < > 0.19* 0.37 0.32 0.29*  CREATININE 0.90  --   --   --   --   --  0.87   < > = values in this interval not displayed.    Estimated Creatinine Clearance: 75 mL/min (by C-G formula based on SCr of 0.87 mg/dL).   Medical History: Past Medical History:  Diagnosis Date   Allergies    Arthritis    Diabetes mellitus without complication (HCC)    Gout    Hard of hearing    Hyperlipidemia    Hypertension    Myocardial infarction [facility]) 2008   treated here at [facility] and transferred to [place] for stent placement   Neuropathy    knees down   Peripheral vascular disease (HCC)    Sleep apnea    "in the past"   Status post primary angioplasty with coronary stent    Wears dentures    full upper and lower    Assessment: Manuel [name] is a 77 y.o. male with past medical history of PVD s/p mechanical thrombectomy of right lower extremity in February 2023, CAD, T2DM, HLD, neuropathy. He was on a DOAC previously, but is not on anticoagulant therapy currently. He is here with pain to the right foot and a black toe on the right foot starting approximately 3 days ago. Vascular surgery consulted and planning  angiography 7/22. Pharmacy has been consulted to initiate and manage heparin infusion.   Baseline Labs: aPTT 32, INR 1.1, Hgb 15.2, Plt 303  Goal of Therapy:  Heparin level 0.3-0.7 units/ml Monitor platelets by anticoagulation protocol: Yes   Date Time HL Rate/Comment  7/21 1931 0.12 1400/subtherapeutic,>1700 7/22 0407 0.19 1700/subtherapeutic >1950 7/22 1357 0.37 Therapeutic x 1 7/22 2349 0.32 Therapeutic x 2 7/23 0521 0.29 Subtherapeutic  Plan:  Increase heparin infusion to 2100 units/hr Recheck HL in 8 hrs after rate change. Daily CBC while on heparin  Otelia Sergeant, PharmD, [facility] 11/12/2022 6:24 AM

## 2022-11-13 ENCOUNTER — Encounter: Payer: Self-pay | Admitting: Family Medicine

## 2022-11-13 ENCOUNTER — Other Ambulatory Visit: Payer: Medicare Other

## 2022-11-13 ENCOUNTER — Other Ambulatory Visit: Payer: Self-pay

## 2022-11-13 ENCOUNTER — Encounter
Admission: EM | Disposition: A | Discharge status: Home Health | Payer: Self-pay | Source: Home / Self Care | Attending: Internal Medicine

## 2022-11-13 DIAGNOSIS — L97909 Non-pressure chronic ulcer of unspecified part of unspecified lower leg with unspecified severity: Secondary | ICD-10-CM | POA: Diagnosis not present

## 2022-11-13 DIAGNOSIS — I2510 Atherosclerotic heart disease of native coronary artery without angina pectoris: Secondary | ICD-10-CM | POA: Diagnosis not present

## 2022-11-13 DIAGNOSIS — E1142 Type 2 diabetes mellitus with diabetic polyneuropathy: Secondary | ICD-10-CM | POA: Diagnosis not present

## 2022-11-13 DIAGNOSIS — I5043 Acute on chronic combined systolic (congestive) and diastolic (congestive) heart failure: Secondary | ICD-10-CM

## 2022-11-13 DIAGNOSIS — I998 Other disorder of circulatory system: Secondary | ICD-10-CM | POA: Diagnosis not present

## 2022-11-13 LAB — BRAIN NATRIURETIC PEPTIDE: B Natriuretic Peptide: 932.6 pg/mL — ABNORMAL HIGH (ref 0.0–100.0)

## 2022-11-13 LAB — HEPARIN LEVEL (UNFRACTIONATED)
Heparin Unfractionated: 0.33 IU/mL (ref 0.30–0.70)
Heparin Unfractionated: 0.36 IU/mL (ref 0.30–0.70)

## 2022-11-13 LAB — BASIC METABOLIC PANEL
Anion gap: 11 (ref 5–15)
BUN: 20 mg/dL (ref 8–23)
CO2: 21 mmol/L — ABNORMAL LOW (ref 22–32)
Calcium: 8.7 mg/dL — ABNORMAL LOW (ref 8.9–10.3)
Chloride: 101 mmol/L (ref 98–111)
Creatinine, Ser: 0.95 mg/dL (ref 0.61–1.24)
GFR, Estimated: >60 mL/min (ref >60)
Glucose, Bld: 156 mg/dL — ABNORMAL HIGH (ref 70–99)
Potassium: 3.7 mmol/L (ref 3.5–5.1)
Sodium: 133 mmol/L — ABNORMAL LOW (ref 135–145)

## 2022-11-13 LAB — CBC
HCT: 38 % — ABNORMAL LOW (ref 39.0–52.0)
Hemoglobin: 13.3 g/dL (ref 13.0–17.0)
MCH: 30.4 pg (ref 26.0–34.0)
MCHC: 35 g/dL (ref 30.0–36.0)
MCV: 87 fL (ref 80.0–100.0)
Platelets: 316 10*3/uL (ref 150–400)
RBC: 4.37 MIL/uL (ref 4.22–5.81)
RDW: 13.3 % (ref 11.5–15.5)
WBC: 9.7 10*3/uL (ref 4.0–10.5)
nRBC: 0 % (ref 0.0–0.2)

## 2022-11-13 LAB — GLUCOSE, CAPILLARY
Glucose-Capillary: 140 mg/dL — ABNORMAL HIGH (ref 70–99)
Glucose-Capillary: 179 mg/dL — ABNORMAL HIGH (ref 70–99)
Glucose-Capillary: 149 mg/dL — ABNORMAL HIGH (ref 70–99)
Glucose-Capillary: 116 mg/dL — ABNORMAL HIGH (ref 70–99)

## 2022-11-13 LAB — CULTURE, BLOOD (ROUTINE X 2)

## 2022-11-13 SURGERY — ENDARTERECTOMY, FEMORAL
Anesthesia: General

## 2022-11-13 MED ORDER — POTASSIUM CHLORIDE CRYS ER 20 MEQ PO TBCR
40.0000 meq | EXTENDED_RELEASE_TABLET | Freq: Once | ORAL | Status: AC
  Administered 2022-11-13: 40 meq via ORAL
  Filled 2022-11-13: qty 2

## 2022-11-13 MED ORDER — PHENYLEPHRINE HCL-NACL 20-0.9 MG/250ML-% IV SOLN
INTRAVENOUS | Status: AC
  Filled 2022-11-13: qty 250

## 2022-11-13 MED ORDER — METHYLPREDNISOLONE SODIUM SUCC 125 MG IJ SOLR: 125.0000 mg | Freq: Once | INTRAMUSCULAR | Status: DC | PRN

## 2022-11-13 MED ORDER — ONDANSETRON HCL 4 MG/2ML IJ SOLN
INTRAMUSCULAR | Status: AC
  Filled 2022-11-13: qty 2

## 2022-11-13 MED ORDER — ROCURONIUM BROMIDE 10 MG/ML (PF) SYRINGE
PREFILLED_SYRINGE | INTRAVENOUS | Status: AC
  Filled 2022-11-13: qty 10

## 2022-11-13 MED ORDER — SUCCINYLCHOLINE CHLORIDE 200 MG/10ML IV SOSY
PREFILLED_SYRINGE | INTRAVENOUS | Status: AC
  Filled 2022-11-13: qty 10

## 2022-11-13 MED ORDER — SODIUM CHLORIDE 0.9 % IV SOLN
3.0000 g | Freq: Four times a day (QID) | INTRAVENOUS | Status: AC
  Administered 2022-11-13 – 2022-11-20 (×28): 3 g via INTRAVENOUS
  Filled 2022-11-13 (×30): qty 8

## 2022-11-13 MED ORDER — NOREPINEPHRINE 4 MG/250ML-% IV SOLN
INTRAVENOUS | Status: AC
  Filled 2022-11-13: qty 250

## 2022-11-13 MED ORDER — MIDAZOLAM HCL 2 MG/2ML IJ SOLN
INTRAMUSCULAR | Status: AC
  Filled 2022-11-13: qty 2

## 2022-11-13 MED ORDER — PROPOFOL 10 MG/ML IV BOLUS
INTRAVENOUS | Status: AC
  Filled 2022-11-13: qty 20

## 2022-11-13 MED ORDER — SODIUM CHLORIDE 0.9 % IV SOLN: INTRAVENOUS | Status: DC

## 2022-11-13 MED ORDER — HEPARIN SODIUM (PORCINE) 5000 UNIT/ML IJ SOLN
INTRAMUSCULAR | Status: AC
  Filled 2022-11-13: qty 1

## 2022-11-13 MED ORDER — CEFAZOLIN SODIUM-DEXTROSE 2-4 GM/100ML-% IV SOLN: 2.0000 g | INTRAVENOUS | Status: DC

## 2022-11-13 MED ORDER — FAMOTIDINE 20 MG PO TABS: 40.0000 mg | ORAL_TABLET | Freq: Once | ORAL | Status: DC | PRN

## 2022-11-13 MED ORDER — DEXAMETHASONE SODIUM PHOSPHATE 10 MG/ML IJ SOLN
INTRAMUSCULAR | Status: AC
  Filled 2022-11-13: qty 1

## 2022-11-13 MED ORDER — LIDOCAINE HCL (PF) 2 % IJ SOLN
INTRAMUSCULAR | Status: AC
  Filled 2022-11-13: qty 5

## 2022-11-13 MED ORDER — HEPARIN 30,000 UNITS/1000 ML (OHS) CELLSAVER SOLUTION
Status: AC
  Filled 2022-11-13: qty 1000

## 2022-11-13 MED ORDER — CEFAZOLIN SODIUM-DEXTROSE 2-4 GM/100ML-% IV SOLN
INTRAVENOUS | Status: AC
  Filled 2022-11-13: qty 100

## 2022-11-13 MED ORDER — PROTAMINE SULFATE 10 MG/ML IV SOLN
INTRAVENOUS | Status: AC
  Filled 2022-11-13: qty 25

## 2022-11-13 MED ORDER — DIPHENHYDRAMINE HCL 50 MG/ML IJ SOLN: 50.0000 mg | Freq: Once | INTRAMUSCULAR | Status: DC | PRN

## 2022-11-13 MED ORDER — FENTANYL CITRATE (PF) 100 MCG/2ML IJ SOLN
INTRAMUSCULAR | Status: AC
  Filled 2022-11-13: qty 2

## 2022-11-13 MED ORDER — PROPOFOL 1000 MG/100ML IV EMUL
INTRAVENOUS | Status: AC
  Filled 2022-11-13: qty 100

## 2022-11-13 MED ORDER — FUROSEMIDE 10 MG/ML IJ SOLN
40.0000 mg | Freq: Two times a day (BID) | INTRAMUSCULAR | Status: DC
  Administered 2022-11-13 (×2): 40 mg via INTRAVENOUS
  Filled 2022-11-13 (×2): qty 4

## 2022-11-13 MED ORDER — MIDAZOLAM HCL 2 MG/ML PO SYRP: 8.0000 mg | ORAL_SOLUTION | Freq: Once | ORAL | Status: DC | PRN

## 2022-11-13 SURGICAL SUPPLY — 72 items
ADH SKN CLS APL DERMABOND .7 (GAUZE/BANDAGES/DRESSINGS)
APL PRP STRL LF DISP 70% ISPRP (MISCELLANEOUS)
APPLIER CLIP 11 MED OPEN (CLIP)
APPLIER CLIP 9.375 SM OPEN (CLIP)
APR CLP MED 11 20 MLT OPN (CLIP)
APR CLP SM 9.3 20 MLT OPN (CLIP)
BAG DECANTER FOR FLEXI CONT (MISCELLANEOUS) ×1 IMPLANT
BAG ISL LRG 20X20 DRWSTRG (DRAPES)
BAG ISOLATATION DRAPE 20X20 ST (DRAPES) IMPLANT
BLADE SURG 15 STRL LF DISP TIS (BLADE) ×1 IMPLANT
BLADE SURG 15 STRL SS (BLADE)
BLADE SURG SZ11 CARB STEEL (BLADE) ×1 IMPLANT
BOOT SUTURE VASCULAR YLW (MISCELLANEOUS)
BRUSH SCRUB EZ 4% CHG (MISCELLANEOUS) ×1 IMPLANT
CHLORAPREP W/TINT 26 (MISCELLANEOUS) ×1 IMPLANT
CLAMP SUTURE YELLOW 5 PAIRS (MISCELLANEOUS) ×1 IMPLANT
CLIP APPLIE 11 MED OPEN (CLIP) IMPLANT
CLIP APPLIE 9.375 SM OPEN (CLIP) IMPLANT
DERMABOND ADVANCED .7 DNX12 (GAUZE/BANDAGES/DRESSINGS) IMPLANT
DRAPE C-ARM XRAY 36X54 (DRAPES) ×1 IMPLANT
DRAPE INCISE IOBAN 66X45 STRL (DRAPES) ×1 IMPLANT
DRESSING SURGICEL FIBRLLR 1X2 (HEMOSTASIS) ×1 IMPLANT
DRSG OPSITE POSTOP 4X6 (GAUZE/BANDAGES/DRESSINGS) IMPLANT
DRSG SURGICEL FIBRILLAR 1X2 (HEMOSTASIS)
ELECT CAUTERY BLADE 6.4 (BLADE) ×1 IMPLANT
ELECT REM PT RETURN 9FT ADLT (ELECTROSURGICAL)
ELECTRODE REM PT RTRN 9FT ADLT (ELECTROSURGICAL) ×1 IMPLANT
GAUZE 4X4 16PLY ~~LOC~~+RFID DBL (SPONGE) ×1 IMPLANT
GLOVE BIO SURGEON STRL SZ7 (GLOVE) ×3 IMPLANT
GOWN STRL REUS W/ TWL LRG LVL3 (GOWN DISPOSABLE) ×2 IMPLANT
GOWN STRL REUS W/ TWL XL LVL3 (GOWN DISPOSABLE) ×2 IMPLANT
GOWN STRL REUS W/TWL LRG LVL3 (GOWN DISPOSABLE)
GOWN STRL REUS W/TWL XL LVL3 (GOWN DISPOSABLE)
IV NS 500ML (IV SOLUTION)
IV NS 500ML BAXH (IV SOLUTION) ×1 IMPLANT
KIT PREVENA INCISION MGT 13 (CANNISTER) IMPLANT
KIT TURNOVER KIT A (KITS) ×1 IMPLANT
LABEL OR SOLS (LABEL) ×1 IMPLANT
LOOP VESSEL MAXI 1X406 RED (MISCELLANEOUS) ×2 IMPLANT
LOOP VESSEL MINI 0.8X406 BLUE (MISCELLANEOUS) ×2 IMPLANT
MANIFOLD NEPTUNE II (INSTRUMENTS) ×1 IMPLANT
NDL SAFETY ECLIP 18X1.5 (MISCELLANEOUS) ×1 IMPLANT
NS IRRIG 500ML POUR BTL (IV SOLUTION) ×1 IMPLANT
PACK BASIN MAJOR ARMC (MISCELLANEOUS) ×1 IMPLANT
PACK UNIVERSAL (MISCELLANEOUS) ×1 IMPLANT
RETRACTOR TRAXI PANNICULUS (MISCELLANEOUS) IMPLANT
SET WALTER ACTIVATION W/DRAPE (SET/KITS/TRAYS/PACK) ×1 IMPLANT
SPONGE T-LAP 18X18 ~~LOC~~+RFID (SPONGE) ×2 IMPLANT
STAPLER SKIN PROX 35W (STAPLE) ×1 IMPLANT
SUT MNCRL 4-0 (SUTURE)
SUT MNCRL 4-0 27XMFL (SUTURE)
SUT PROLENE 5 0 RB 1 DA (SUTURE) ×2 IMPLANT
SUT PROLENE 6 0 BV (SUTURE) ×4 IMPLANT
SUT PROLENE 7 0 BV 1 (SUTURE) ×2 IMPLANT
SUT SILK 2 0 (SUTURE)
SUT SILK 2-0 18XBRD TIE 12 (SUTURE) ×1 IMPLANT
SUT SILK 3 0 (SUTURE)
SUT SILK 3-0 18XBRD TIE 12 (SUTURE) ×1 IMPLANT
SUT SILK 4 0 (SUTURE)
SUT SILK 4-0 18XBRD TIE 12 (SUTURE) ×1 IMPLANT
SUT VIC AB 2-0 CT1 27 (SUTURE)
SUT VIC AB 2-0 CT1 TAPERPNT 27 (SUTURE) ×2 IMPLANT
SUT VIC AB 3-0 SH 27 (SUTURE)
SUT VIC AB 3-0 SH 27X BRD (SUTURE) ×1 IMPLANT
SUT VICRYL+ 3-0 36IN CT-1 (SUTURE) ×2 IMPLANT
SUTURE MNCRL 4-0 27XMF (SUTURE) IMPLANT
SYR 20ML LL LF (SYRINGE) ×1 IMPLANT
SYR 5ML LL (SYRINGE) ×1 IMPLANT
TAG SUTURE CLAMP YLW 5PR (MISCELLANEOUS)
TRAP FLUID SMOKE EVACUATOR (MISCELLANEOUS) ×1 IMPLANT
TRAY FOLEY MTR SLVR 16FR STAT (SET/KITS/TRAYS/PACK) ×1 IMPLANT
WATER STERILE IRR 500ML POUR (IV SOLUTION) ×1 IMPLANT

## 2022-11-13 NOTE — Progress Notes (Signed)
Psychiatry consult received. There is a Do Not Enter; See nurse before entering sign observed hanging from the patient's door. This Thereasa Parkin spoke with the patient's assigned nurse who reported that a scheduled surgical procedure for this morning was canceled, and the patient may still be resting. He authorized this author to proceed with the psychiatric evaluation.   Patient sleeping soundly upon approach. He arouses briefly when this author entered the room. His wife is seated in a recliner chair at the bedside and stated this is the most rest he has been able to obtain since being in the hospital due to ongoing pain issues. This author informed patient that I would come back this afternoon when he is more awake and alert in  order to complete a comprehensive assessment. Patient thanked Conservation officer, nature for allowing him to rest at this time.   Tempest Frankland H. Willeen Cass, NP 11/13/2022

## 2022-11-13 NOTE — Progress Notes (Signed)
Patient's family voiced concerns about patient's thoughts of suicide. Patient states he wishes to be dead, but denies a plan or attempt to harm himself. Family states patient has voiced ways he would harm himself. Notified Dr. Wyn Quaker and Dr. Nelson Chimes. Patient's surgery has been cancelled. Notified RN on floor of above.

## 2022-11-13 NOTE — Progress Notes (Signed)
MD has been notified about the patient's request to become DNR.

## 2022-11-13 NOTE — Progress Notes (Signed)
Patient having dyspnea this morning on supplemental oxygen.  He feels very poorly and is anxious.  He has fairly profound right lower extremity ischemia as well as significant left lower extremity disease the surgical repair is going to be 4+ hour surgery.  I do not think he is optimized for surgery at this point, and I think postponing the surgery today will potentially help this.  The patient is questioning whether or not he wants to have surgery at all and I have discussed with he and his family that the only way to improve the ischemic pain is likely with revascularization.  We will postpone the procedure today and consider for later in the week if he is medically optimized.

## 2022-11-13 NOTE — Plan of Care (Signed)

## 2022-11-13 NOTE — Progress Notes (Signed)
Progress Note   Patient: Manuel [name] ZOX:[id number] DOB: 1946/03/09 DOA: 11/10/2022     3 DOS: the patient was seen and examined on 11/13/2022   Brief hospital course: Taken from H&P.   Manuel [name] is a 77 y.o. male with medical history significant of  insulin-dependent DM, HTN, CAD, PAD s/p angioplasty with stent 05/14/2021 presenting with right lower extremity ischemia and ulcer formation with gangrenous toe.  Presented to the ER afebrile, hemodynamically stable. White count 11.2, hemoglobin 15.2, platelets 303, lactate 1.2, creatinine 0.9. T. bili 1.5. Plain films of the right foot with no signs of osteomyelitis. Dr. Evie [name] with vascular surgery initially consulted with recommendation for heparin drip. Dr. Ether [name] with podiatry also consulted for further evaluation.   7/22: Vital stable.  Going for lower extremity angiography and possible angioplasty with vascular today.  CRP elevated at 10.1, decreased prealbumin at 15 and normal ESR.  Preliminary blood cultures negative.  Continue current broad-spectrum antibiotics.  Found to have very extensive disease and most likely will required bilateral femoral endarterectomies and bilateral iliac stents.  Vascular is looking for a OR time for this extensive procedure which will require 4 to 5 hours.  7/23: Vital and labs stable.  Overnight some shortness of breath and crackles.  One-time 40 mg of IV Lasix was given.  Chest x-ray ordered but patient refused. Patient had a choking spell on meds this morning, continued to feel something sticking in his chest.  No hypoxia.  Chest x-ray with diffuse interstitial prominence and areas of nodularity throughout the both lungs.  Differential include atypical inflammatory process versus atypical pulmonary edema.  Ordered 1 dose of IV Lasix Going for his extensive vascular procedure tomorrow.  7/24: Mildly elevated blood pressure.  BNP elevated at 932, surgery got canceled for concern of acute on chronic  heart failure and patient now need cardiology clearance before proceeding. Family concern of suicidal thoughts, psych was consulted.  Assessment and Plan: * Ischemic ulcer of lower extremity (HCC) Worsening right lower extremity redness swelling and pain over the past 2 weeks in the setting of baseline limb ischemia status post angioplasty and stenting January 2023.Noted worsening ulcer formation across multiple digits of the foot with concern for gangrene Patient reports compliance with Plavix and statin therapy Started on heparin drip in the ER  Vascular procedure with extensive peripheral vascular disease, second procedure for bilateral endarterectomies and bilateral iliac stent placement with vascular surgery was supposed to be today and got canceled last minute due to concern of acute on chronic heart failure.  Patient need cardiology clearance per anesthesia -Broad-spectrum antibiotics switched with Augmentin -Podiatry will decide about the appropriate procedure after vascular -Vascular to reschedule procedure after cardiology clearance    Diabetic polyneuropathy associated with type 2 diabetes mellitus (HCC) Cont neurontin    Coronary artery disease with history of myocardial infarction without history of CABG Followed by Dr. Gwen [name] outpt  CAD s/p PCI to [facility] with overlapping 3 mm Xience stents of 23 mm and 12 mm length in 2011  No active chest pain On plavix monotherapy and statin chronically  On heparin drip in the setting of right lower extremity ischemia Follow  Hypertension BP stable  Titrate home regimen    Diabetes mellitus type 2, insulin dependent (HCC) SSI   Alcohol abuse 3-4 beer daily drinker + mixed drinks  Will place on CIWA protocol  Follow   Mixed hyperlipidemia Continue statin  Acute on chronic combined systolic and diastolic CHF (congestive heart  failure) (HCC) According to a prior echo done in 2020, EF of 40 to 45%. Patient with concern of  shortness of breath, mild hypoxia and chest x-ray with bilateral opacities.  BNP elevated at 932. Echocardiogram pending Cardiology is consulted   Subjective: Patient was sleeping during morning rounds.  Wife does not want me to wake him up.  He was quite upset earlier due to postponed procedure.  Physical Exam: Vitals:   11/12/22 2032 11/13/22 0444 11/13/22 0658 11/13/22 0824  BP: (!) 154/78 133/69 (!) 169/94 137/86  Pulse: 91 91 95 90  Resp: 18 (!) 22 18 18   Temp: 98.4 F (36.9 C) 99 F (37.2 C) 99.5 F (37.5 C) 98.6 F (37 C)  TempSrc: Oral Oral Temporal Temporal  SpO2: 93% 96% 91% 95%  Weight:   90 kg   Height:   5\' 8"  (1.727 m)    General.  Somnolent elderly man, in no acute distress. Pulmonary.  Lungs clear bilaterally, normal respiratory effort. CV.  Regular rate and rhythm, no JVD, rub or murmur. Abdomen.  Soft, nontender, nondistended, BS positive. CNS.  Somnolent.  No focal neurologic deficit. Extremities.  No edema, decreased pedal pulses and fourth toe gangrene. Psychiatry.  Judgment and insight appears normal. .    Data Reviewed: Prior data reviewed  Family Communication: Discussed with wife at bedside  Disposition: Status is: Inpatient Remains inpatient appropriate because: Severity of illness  Planned Discharge Destination:  To be determined  Time spent: 45 minutes  This record has been created using Conservation officer, historic buildings. Errors have been sought and corrected,but may not always be located. Such creation errors do not reflect on the standard of care.   Author: Arnetha Courser, MD 11/13/2022 3:56 PM  For on call review [email or web address].

## 2022-11-13 NOTE — Plan of Care (Signed)
The patient did not have his vascular procedure. Diet has been ordered. Heparin drip has been restarted. IV antibiotics has been modified. Call bell at reach. Bed alarm in place when family is not in the room.  Problem: Education: Goal: Knowledge of General Education information will improve Description: Including pain rating scale, medication(s)/side effects and non-pharmacologic comfort measures Outcome: Not Progressing   Problem: Health Behavior/Discharge Planning: Goal: Ability to manage health-related needs will improve Outcome: Not Progressing   Problem: Clinical Measurements: Goal: Ability to maintain clinical measurements within normal limits will improve Outcome: Not Progressing Goal: Will remain free from infection Outcome: Not Progressing Goal: Diagnostic test results will improve Outcome: Not Progressing Goal: Respiratory complications will improve Outcome: Not Progressing Goal: Cardiovascular complication will be avoided Outcome: Not Progressing   Problem: Activity: Goal: Risk for activity intolerance will decrease Outcome: Not Progressing   Problem: Nutrition: Goal: Adequate nutrition will be maintained Outcome: Not Progressing   Problem: Coping: Goal: Level of anxiety will decrease Outcome: Not Progressing   Problem: Elimination: Goal: Will not experience complications related to bowel motility Outcome: Not Progressing Goal: Will not experience complications related to urinary retention Outcome: Not Progressing   Problem: Pain Managment: Goal: General experience of comfort will improve Outcome: Not Progressing   Problem: Safety: Goal: Ability to remain free from injury will improve Outcome: Not Progressing   Problem: Skin Integrity: Goal: Risk for impaired skin integrity will decrease Outcome: Not Progressing   Problem: Education: Goal: Ability to describe self-care measures that may prevent or decrease complications (Diabetes Survival Skills  Education) will improve Outcome: Not Progressing Goal: Individualized Educational Video(s) Outcome: Not Progressing   Problem: Coping: Goal: Ability to adjust to condition or change in health will improve Outcome: Not Progressing   Problem: Fluid Volume: Goal: Ability to maintain a balanced intake and output will improve Outcome: Not Progressing   Problem: Health Behavior/Discharge Planning: Goal: Ability to identify and utilize available resources and services will improve Outcome: Not Progressing Goal: Ability to manage health-related needs will improve Outcome: Not Progressing   Problem: Metabolic: Goal: Ability to maintain appropriate glucose levels will improve Outcome: Not Progressing   Problem: Nutritional: Goal: Maintenance of adequate nutrition will improve Outcome: Not Progressing Goal: Progress toward achieving an optimal weight will improve Outcome: Not Progressing   Problem: Skin Integrity: Goal: Risk for impaired skin integrity will decrease Outcome: Not Progressing

## 2022-11-13 NOTE — Anesthesia Preprocedure Evaluation (Addendum)
Anesthesia Evaluation  Patient identified by MRN, date of birth, ID band Patient awake    Reviewed: Allergy & Precautions, H&P , NPO status , Patient's Chart, lab work & pertinent test results, reviewed documented beta blocker date and time   Airway Mallampati: II  TM Distance: >3 FB Neck ROM: full    Dental  (+) Upper Dentures, Lower Dentures   Pulmonary sleep apnea  CXR 7/23: IMPRESSION: Diffuse interstitial prominence with areas of nodularity throughout both lungs. Differential considerations include an acute atypical infectious/inflammatory process such as viral pneumonia versus atypical pulmonary edema.   Trace bilateral pleural effusions and associated bibasilar atelectasis.    Pulmonary exam normal breath sounds clear to auscultation       Cardiovascular hypertension, Pt. on home beta blockers and Pt. on medications + CAD, + Past MI, + Cardiac Stents (CAD s/p PCI to [facility] with overlapping 3 mm Xience stents of 23 mm and 12 mm length in 2011), + Peripheral Vascular Disease (Ischemic ulcer of lower extremity. Lower limb ischemia right leg with history of angioplasty and stent on 05/14/2021) and +CHF  Normal cardiovascular exam+ Valvular Problems/Murmurs MR  Rhythm:regular Rate:Normal  Bilateral carotid artery stenosis  BNP elevated to 932 this morning  EKG 7/21: Nonspecific interventricular conduction delay  MPS 10/2018: LVEF= 45%  FINDINGS:  Regional wall motion:  demonstrates  hypokinesis of the inferior wall.  The overall quality of the study is fair.   Artifacts noted: no  Left ventricular cavity: normal.    ECHO 2021: INTERPRETATION  MILD LV SYSTOLIC DYSFUNCTION WITH AN ESTIMATED EF = 40-45 %  NORMAL RIGHT VENTRICULAR SYSTOLIC FUNCTION  MILD-TO-MODERATE MITRAL VALVE INSUFFICIENCY  TRACE TRICUSPID VALVE INSUFFICIENCY  NO VALVULAR STENOSIS  INFERIOR WALL HYPOKINESIS     Neuro/Psych  Neuromuscular disease  (Lumbar radiculopathy)    GI/Hepatic ,,,(+)     substance abuse (3-4 beer daily drinker + mixed drinks)  alcohol use  Endo/Other  diabetes, Poorly Controlled    Renal/GU      Musculoskeletal  (+) Arthritis ,    Abdominal  (+) + obese  Peds  Hematology   Anesthesia Other Findings   Reproductive/Obstetrics                             Anesthesia Physical Anesthesia Plan  ASA: 3  Anesthesia Plan: General   Post-op Pain Management: Ofirmev IV (intra-op)* and Gabapentin PO (pre-op)*   Induction: Intravenous  PONV Risk Score and Plan: 2 and Ondansetron and Dexamethasone  Airway Management Planned: Oral ETT  Additional Equipment:   Intra-op Plan:   Post-operative Plan: Extubation in OR  Informed Consent:      Dental Advisory Given  Plan Discussed with: CRNA  Anesthesia Plan Comments: (Pt with cough/SOB, elevated BNP, and opacities on chest x-ray concerning for edema. Pt is agreeable to postpone surgery for cardiology evaluation.)        Anesthesia Quick Evaluation

## 2022-11-13 NOTE — Consult Note (Signed)
ANTICOAGULATION CONSULT NOTE  Pharmacy Consult for Heparin Infusion Indication: chest pain/ACS  No Known Allergies  Patient Measurements: Height: 5\' 8"  (172.7 cm) Weight: 90 kg (198 lb 6.6 oz) IBW/kg (Calculated) : 68.4 Heparin Dosing Weight: 85.7 kg  Vital Signs: Temp: 98.6 F (37 C) (07/24 0824) Temp Source: Temporal (07/24 0824) BP: 137/86 (07/24 0824) Pulse Rate: 90 (07/24 0824)  Labs: Recent Labs    11/11/22 0407 11/11/22 1357 11/12/22 0521 11/12/22 1426 11/12/22 2229 11/13/22 0451  HGB 12.5*  --  12.8*  --   --  13.3  HCT 35.6*  --  36.3*  --   --  38.0*  PLT 305  --  298  --   --  316  HEPARINUNFRC 0.19*   < > 0.29* 0.38 0.44 0.33  CREATININE  --   --  0.87  --   --  0.95   < > = values in this interval not displayed.    Estimated Creatinine Clearance: 68.7 mL/min (by C-G formula based on SCr of 0.95 mg/dL).   Medical History: Past Medical History:  Diagnosis Date   Allergies    Arthritis    Diabetes mellitus without complication (HCC)    Gout    Hard of hearing    Hyperlipidemia    Hypertension    Myocardial infarction [facility]) 2008   treated here at [facility] and transferred to [place] for stent placement   Neuropathy    knees down   Peripheral vascular disease (HCC)    Sleep apnea    "in the past"   Status post primary angioplasty with coronary stent    Wears dentures    full upper and lower    Assessment: Manuel [name] is a 77 y.o. male with past medical history of PVD s/p mechanical thrombectomy of right lower extremity in February 2023, CAD, T2DM, HLD, neuropathy. He was on a DOAC previously, but is not on anticoagulant therapy currently. He is here with pain to the right foot and a black toe on the right foot starting approximately 3 days ago. Vascular surgery consulted and planning angiography 7/22. Pharmacy has been consulted to initiate and manage heparin infusion.   Baseline Labs: aPTT 32, INR 1.1, Hgb 15.2, Plt 303  Goal of Therapy:   Heparin level 0.3-0.7 units/ml Monitor platelets by anticoagulation protocol: Yes  Plan:  Anti-Xa level is therapeutic x 2 Continue heparin infusion at 2100 units/hr Recheck HL daily w/ AM labs while therapeutic Daily CBC while on heparin  Burnis Medin, PharmD, BCPS 11/13/2022 1:25 PM

## 2022-11-13 NOTE — Assessment & Plan Note (Signed)
Followed by Dr. [name] outpt  CAD s/p PCI to [facility] with overlapping 3 mm Xience stents of 23 mm and 12 mm length in 2011  No active chest pain On plavix monotherapy and statin chronically  On heparin drip in the setting of right lower extremity ischemia Follow

## 2022-11-13 NOTE — TOC Initial Note (Signed)
Transition of Care [facility]) - Initial/Assessment Note    Patient Details  Name: Manuel [name] MRN: [id number] Date of Birth: [birth year]  Transition of Care [facility]) CM/SW Contact:    Chapman Fitch, RN Phone Number: 11/13/2022, 11:39 AM  Clinical Narrative:                  Per chart review Admitted for: ischemic ulcer lower extremity  Admitted from: home with wife  PCP: Letitia Libra   Per vascular procedure postponed Patient noted to no be feeling well, and anxious, per nursing should attempt assessment with wife instead.  VM left for wife  Patient would benefit from therapy Eval when medically appropriate   Expected Discharge Plan: Home w Home Health Services     Patient Goals and CMS Choice            Expected Discharge Plan and Services                                              Prior Living Arrangements/Services                       Activities of Daily Living Home Assistive Devices/Equipment: None ADL Screening (condition at time of admission) Patient's cognitive ability adequate to safely complete daily activities?: Yes Is the patient deaf or have difficulty hearing?: No Does the patient have difficulty seeing, even when wearing glasses/contacts?: No Does the patient have difficulty concentrating, remembering, or making decisions?: No Patient able to express need for assistance with ADLs?: Yes Does the patient have difficulty dressing or bathing?: No Independently performs ADLs?: Yes (appropriate for developmental age) Does the patient have difficulty walking or climbing stairs?: No Weakness of Legs: None Weakness of Arms/Hands: None  Permission Sought/Granted                  Emotional Assessment              Admission diagnosis:  Cellulitis of foot [L03.119] Ischemic ulcer of lower extremity (HCC) [L97.909] Limb ischemia [I99.8] Toe necrosis (HCC) [I96] Patient Active Problem List   Diagnosis Date Noted   Acute on  chronic combined systolic and diastolic CHF (congestive heart failure) (HCC) 11/12/2022   Ischemic ulcer of lower extremity (HCC) 11/10/2022   Limb ischemia 11/10/2022   Left flank pain 06/16/2021   Constipation 06/16/2021   Foot ulcer, right (HCC) 06/14/2021   Lower limb ischemia right leg with history of angioplasty and stent on 05/14/2021 06/13/2021   Alcohol abuse 06/07/2021   Coronary artery disease with history of myocardial infarction without history of CABG 05/08/2021   S/P angioplasty with stent 05/08/2021   Atherosclerosis of native arteries of the extremities with ulceration (HCC) 05/08/2021   Depression, 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   Diabetic polyneuropathy associated with type 2 diabetes mellitus (HCC) 09/25/2017   Lumbar radiculopathy 01/27/2017   Lumbar degenerative disc disease 01/27/2017   Lumbar spondylosis 01/27/2017   Lumbar facet arthropathy 01/27/2017   Diabetes mellitus type 2, insulin dependent (HCC) 10/18/2014   Hypertension 10/05/2014   Bilateral carotid artery stenosis 04/05/2014   Microalbuminuria 12/19/2013   Gout 09/21/2013   Mixed hyperlipidemia 09/14/2013   PCP:  Gracelyn Nurse, MD Pharmacy:   [id number] [place], [place] - [address] [place] [address] Phone: [phone] Fax: [phone]     Social Determinants of Health (SDOH) Social History: SDOH Screenings   Food Insecurity: No Food Insecurity (11/10/2022)  Housing: Low Risk  (11/10/2022)  Transportation Needs: No Transportation Needs (11/10/2022)  Utilities: Not At Risk (11/10/2022)  Tobacco Use: Low Risk  (11/13/2022)  Recent Concern: Tobacco Use - Medium Risk (09/09/2022)   Received from [facility], [facility]   SDOH Interventions:     Readmission Risk Interventions     No data to display

## 2022-11-13 NOTE — Progress Notes (Signed)
Pharmacy Antibiotic Note  Manuel [name] is a 77 y.o. male admitted on 11/10/2022 with limb ischemia and toe necrosis. Patient has PMH of PVD and DM. Pharmacy has been consulted for cefepime and vancomycin dosing.Patient is also receiving flagyl IV every 12 hours. Patient received vancomycin 2000mg  loading dose in ED.   Plan: Continue Vancomycin 1750 mg IV Q 24 hrs. Goal AUC 400-550. Expected AUC: 452.5 SCr used: 0.87 Vd: 0.72   Continue Cefepime 2g IV every 8 hours   Pharmacy will continue to monitor renal function and adjust dose as needed.   Height: 5\' 8"  (172.7 cm) Weight: 90 kg (198 lb 6.6 oz) IBW/kg (Calculated) : 68.4  Temp (24hrs), Avg:98.7 F (37.1 C), Min:98 F (36.7 C), Max:99.5 F (37.5 C)  Recent Labs  Lab 11/10/22 0954 11/10/22 1128 11/11/22 0407 11/12/22 0521 11/13/22 0451  WBC 11.2*  --  10.2 9.7 9.7  CREATININE 0.90  --   --  0.87  --   LATICACIDVEN 0.9 1.2  --   --   --     Estimated Creatinine Clearance: 75 mL/min (by C-G formula based on SCr of 0.87 mg/dL).    No Known Allergies  Antimicrobials this admission: 7/21 Zosyn x 1  7/21 Vancomycin >>  7/21 Cefepime>>  7/21 Flagyl>>  Microbiology results: 7/21 BCx: ngtd  Thank you for allowing pharmacy to be a part of this patient's care.  [name], PharmD Clinical Pharmacist 11/13/2022 7:40 AM

## 2022-11-13 NOTE — Consult Note (Signed)
     Consent obtained to speak with the patient's wife, Gillis Ends, during the previous encounter.   Collateral: I spoke with the patient's wife, Gillis Ends, separate from the patient. The patient's wife reports he has a history of depression, alcoholism, and diabetes, is experiencing significant leg pain due to poor blood circulation around previously placed stents. The patient's wife reports his surgery was canceled due to cardiac and respiratory issues, which has exacerbated his depressive symptom. She reports that he has reduced his alcohol consumption and is less active, with a blackened toe indicating necrosis   She reports the patient has expressed suicidal ideation, mentioning methods such as overdosing on medication and cutting his wrists in the bathtub. She reports that the patient has a family history of suicide, with one nephew having committed suicide. She reports that the patient is a retired Psychologist, occupational, and has been less active in recent years, with a decline in activities such as walking, biking, and kayaking. She reports he attempted to mow the yard last week but found it too physically demanding. The patient's wife reports that he has been trying to reduce his alcohol consumption due to diabetes and has not been drinking as much lately, likely due to feeling unwell.  The patient's wife reports that he has been verbalized feeling lonely and has been urging her to retire from her full-time job. She reports that she feels guilty about continuing to work but prefers to stay busy. She reports the patient is not very outgoing and has low self-esteem, believing that people do not genuinely like him. When asked if he has seen a chaplain during this hospitalization, she reports he has a history of a negative experience with [employer] school, which has led to a lack of religious beliefs. She reports the patient's daughter, a Engineer, civil (consulting), has been involved in his care, and he has two other children.  Hampton Abbot, NP 11/13/2022

## 2022-11-14 ENCOUNTER — Inpatient Hospital Stay
Admit: 2022-11-14 | Discharge: 2022-11-14 | Disposition: A | Discharge status: Home / Self Care | Payer: Medicare Other | Attending: Cardiology | Admitting: Cardiology

## 2022-11-14 DIAGNOSIS — L97909 Non-pressure chronic ulcer of unspecified part of unspecified lower leg with unspecified severity: Secondary | ICD-10-CM | POA: Diagnosis not present

## 2022-11-14 DIAGNOSIS — E1142 Type 2 diabetes mellitus with diabetic polyneuropathy: Secondary | ICD-10-CM | POA: Diagnosis not present

## 2022-11-14 DIAGNOSIS — I2510 Atherosclerotic heart disease of native coronary artery without angina pectoris: Secondary | ICD-10-CM | POA: Diagnosis not present

## 2022-11-14 DIAGNOSIS — I998 Other disorder of circulatory system: Secondary | ICD-10-CM | POA: Diagnosis not present

## 2022-11-14 DIAGNOSIS — F4321 Adjustment disorder with depressed mood: Secondary | ICD-10-CM | POA: Diagnosis present

## 2022-11-14 LAB — ECHOCARDIOGRAM COMPLETE
AV Area mean vel: 2.51 cm2
AV Mean grad: 2 mmHg
AV Peak grad: 4.2 mmHg
Ao pk vel: 1.02 m/s
Area-P 1/2: 5.02 cm2
Calc EF: 24.5 %
Height: 68 in
S' Lateral: 4 cm
Single Plane A2C EF: 23.7 %
Single Plane A4C EF: 29.8 %
Weight: 3065.28 oz

## 2022-11-14 LAB — BASIC METABOLIC PANEL
Anion gap: 6 (ref 5–15)
BUN: 19 mg/dL (ref 8–23)
CO2: 22 mmol/L (ref 22–32)
Calcium: 8.4 mg/dL — ABNORMAL LOW (ref 8.9–10.3)
Chloride: 104 mmol/L (ref 98–111)
Creatinine, Ser: 0.79 mg/dL (ref 0.61–1.24)
GFR, Estimated: >60 mL/min (ref >60)
Glucose, Bld: 113 mg/dL — ABNORMAL HIGH (ref 70–99)
Potassium: 3.6 mmol/L (ref 3.5–5.1)
Sodium: 132 mmol/L — ABNORMAL LOW (ref 135–145)

## 2022-11-14 LAB — CBC
Hemoglobin: 13 g/dL (ref 13.0–17.0)
MCH: 30.1 pg (ref 26.0–34.0)
MCHC: 34.1 g/dL (ref 30.0–36.0)
Platelets: 317 10*3/uL (ref 150–400)
RBC: 4.32 MIL/uL (ref 4.22–5.81)
RDW: 13.6 % (ref 11.5–15.5)
WBC: 8.3 10*3/uL (ref 4.0–10.5)
nRBC: 0 % (ref 0.0–0.2)

## 2022-11-14 LAB — CREATININE, SERUM
Creatinine, Ser: 0.82 mg/dL (ref 0.61–1.24)
GFR, Estimated: >60 mL/min (ref >60)

## 2022-11-14 LAB — GLUCOSE, CAPILLARY
Glucose-Capillary: 101 mg/dL — ABNORMAL HIGH (ref 70–99)
Glucose-Capillary: 143 mg/dL — ABNORMAL HIGH (ref 70–99)
Glucose-Capillary: 183 mg/dL — ABNORMAL HIGH (ref 70–99)
Glucose-Capillary: 154 mg/dL — ABNORMAL HIGH (ref 70–99)

## 2022-11-14 LAB — HEPARIN LEVEL (UNFRACTIONATED)
Heparin Unfractionated: 0.27 IU/mL — ABNORMAL LOW (ref 0.30–0.70)
Heparin Unfractionated: 0.47 IU/mL (ref 0.30–0.70)

## 2022-11-14 MED ORDER — FUROSEMIDE 10 MG/ML IJ SOLN
60.0000 mg | Freq: Two times a day (BID) | INTRAMUSCULAR | Status: DC
  Administered 2022-11-14 – 2022-11-16 (×6): 60 mg via INTRAVENOUS
  Filled 2022-11-14: qty 6
  Filled 2022-11-14: qty 8
  Filled 2022-11-14 (×2): qty 6
  Filled 2022-11-14 (×2): qty 8
  Filled 2022-11-14: qty 6

## 2022-11-14 MED ORDER — LOSARTAN POTASSIUM 25 MG PO TABS
25.0000 mg | ORAL_TABLET | Freq: Every day | ORAL | Status: DC
  Administered 2022-11-14 – 2022-11-18 (×3): 25 mg via ORAL
  Filled 2022-11-14 (×3): qty 1

## 2022-11-14 MED ORDER — AMLODIPINE BESYLATE 10 MG PO TABS
10.0000 mg | ORAL_TABLET | Freq: Every day | ORAL | Status: DC
  Filled 2022-11-14: qty 1

## 2022-11-14 MED ORDER — CARVEDILOL 6.25 MG PO TABS
12.5000 mg | ORAL_TABLET | Freq: Two times a day (BID) | ORAL | Status: DC
  Administered 2022-11-14 – 2022-11-26 (×22): 12.5 mg via ORAL
  Filled 2022-11-14: qty 1
  Filled 2022-11-14: qty 2
  Filled 2022-11-14 (×3): qty 1
  Filled 2022-11-14: qty 2
  Filled 2022-11-14 (×7): qty 1
  Filled 2022-11-14: qty 2
  Filled 2022-11-14 (×3): qty 1
  Filled 2022-11-14: qty 2
  Filled 2022-11-14: qty 1
  Filled 2022-11-14 (×2): qty 2
  Filled 2022-11-14: qty 1
  Filled 2022-11-14: qty 2

## 2022-11-14 MED ORDER — SPIRONOLACTONE 12.5 MG HALF TABLET
12.5000 mg | ORAL_TABLET | Freq: Every day | ORAL | Status: DC
  Administered 2022-11-14 – 2022-11-18 (×4): 12.5 mg via ORAL
  Filled 2022-11-14 (×5): qty 1

## 2022-11-14 MED ORDER — HEPARIN BOLUS VIA INFUSION
1300.0000 [IU] | Freq: Once | INTRAVENOUS | Status: AC
  Administered 2022-11-14: 1300 [IU] via INTRAVENOUS
  Filled 2022-11-14: qty 1300

## 2022-11-14 MED ORDER — POTASSIUM CHLORIDE CRYS ER 20 MEQ PO TBCR
40.0000 meq | EXTENDED_RELEASE_TABLET | Freq: Two times a day (BID) | ORAL | Status: AC
  Administered 2022-11-14 (×2): 40 meq via ORAL
  Filled 2022-11-14 (×2): qty 2

## 2022-11-14 NOTE — H&P (View-Only) (Signed)
Patient feeling much better today.  Breathing comfortably after some diuresis.  Appreciate medical and cardiology input in improving his status.  Echocardiogram being performed as I was in the room and results pending.  He and his wife are aware of the serious nature of the situation and the high risk of limb loss.  We had a long discussion today regarding the planned surgery of bilateral femoral endarterectomies, bilateral iliac stent placement, and likely right popliteal and tibial intervention as well.  We discussed the risks and benefits of the procedure.  We discussed that without procedure, limb loss is inevitable.  He and his wife desire to proceed with surgery and we will try to get this scheduled for tomorrow.

## 2022-11-14 NOTE — Assessment & Plan Note (Signed)
Family was concerned about some suicidal thoughts so psychiatry was consulted. They cleared him today as patient denies any suicidal thoughts, stating that he was just frustrated

## 2022-11-14 NOTE — Progress Notes (Signed)
[facility] CLINIC CARDIOLOGY CONSULT NOTE       Patient ID: Manuel [name] MRN: [id number] DOB/AGE: 07/14/1945 77 y.o.  Admit date: 11/10/2022 Referring Physician Dr. Arnetha Courser  Primary Physician Dr. Marcelino Duster  Primary Cardiologist Dr. 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 ED 11/10/2022 with RLE ischemia, ulcerated and gangrenous R fourth toe. Bilateral LE revascularization with Dr. Wyn Quaker was planned on 7/24 but cancelled 2/2 dyspnea and heart failure symptoms. Cardiology is consulted for assistance with the patients heart failure and medical optimization prior to vascular surgery.   Interval History:  -Feels much better today after getting a good nights rest -On room air, without dyspnea, chest pain, heart racing or palpitations -Diuresing well -Echo performed this morning, pending read  Review of systems complete and found to be negative unless listed above     Past Medical History:  Diagnosis Date   Allergies    Arthritis    Diabetes mellitus without complication (HCC)    Gout    Hard of hearing    Hyperlipidemia    Hypertension    Myocardial infarction [facility]) 2008   treated here at [facility] and transferred to [place] for stent placement   Neuropathy    knees down   Peripheral vascular disease (HCC)    Sleep apnea    "in the past"   Status post primary angioplasty with coronary stent    Wears dentures    full upper and lower    Past Surgical History:  Procedure Laterality Date   CATARACT EXTRACTION W/PHACO Right 03/14/2021   Procedure: CATARACT EXTRACTION PHACO AND INTRAOCULAR LENS PLACEMENT (IOC) RIGHT DIABETIC;  Surgeon: Lockie Mola, MD;  Location: [facility];  Service: Ophthalmology;  Laterality: Right;  Diabetic 16.78 01:39.9   CATARACT EXTRACTION W/PHACO Left 03/28/2021   Procedure: CATARACT EXTRACTION PHACO AND INTRAOCULAR LENS PLACEMENT (IOC) LEFT DIABETIC 6.93 01:22.0;  Surgeon: Lockie Mola, MD;  Location: [facility];  Service: Ophthalmology;  Laterality: Left;  Diabetic   LOWER EXTREMITY ANGIOGRAPHY Right 05/14/2021   Procedure: LOWER EXTREMITY ANGIOGRAPHY;  Surgeon: Annice Needy, MD;  Location: [facility] INVASIVE CV LAB;  Service: Cardiovascular;  Laterality: Right;   LOWER EXTREMITY ANGIOGRAPHY Right 06/14/2021   Procedure: Lower Extremity Angiography;  Surgeon: Annice Needy, MD;  Location: [facility] INVASIVE CV LAB;  Service: Cardiovascular;  Laterality: Right;   LOWER EXTREMITY ANGIOGRAPHY Right 11/11/2022   Procedure: Lower Extremity Angiography;  Surgeon: Annice Needy, MD;  Location: [facility] INVASIVE CV LAB;  Service: Cardiovascular;  Laterality: Right;   LOWER EXTREMITY INTERVENTION Right 06/15/2021   Procedure: LOWER EXTREMITY INTERVENTION;  Surgeon: Annice Needy, MD;  Location: [facility] INVASIVE CV LAB;  Service: Cardiovascular;  Laterality: Right;   TONSILLECTOMY     had removed as a child    Medications Prior to Admission  Medication Sig Dispense Refill Last Dose   amLODipine (NORVASC) 10 MG tablet Take 10 mg by mouth daily.   Past Week   carvedilol (COREG) 12.5 MG tablet Take 1 tablet by mouth 2 (two) times daily.   Past Week   clopidogrel (PLAVIX) 75 MG tablet Take 1 tablet (75 mg total) by mouth daily.  90 tablet 1 Past Week   gabapentin (NEURONTIN) 400 MG capsule Take 800 mg by mouth 2 (two) times daily.   Past Week   insulin aspart protamine- aspart (NOVOLOG MIX 70/30) (70-30) 100 UNIT/ML injection Inject 32 Units into the skin 2 (two) times daily with a meal.   Past Week   lisinopril (ZESTRIL) 20 MG tablet Take 1 tablet (20 mg total) by mouth daily. 30 tablet 1 Past Week   apixaban  (ELIQUIS) 5 MG TABS tablet Take 1 tablet (5 mg total) by mouth 2 (two) times daily. (Patient not taking: Reported on 01/25/2022) 60 tablet 1 Not Taking   aspirin EC 81 MG tablet Take 1 tablet (81 mg total) by mouth daily. (Patient not taking: Reported on 08/13/2022) 150 tablet 2 Not Taking   atorvastatin (LIPITOR) 80 MG tablet Take 1 tablet (80 mg total) by mouth daily. (Patient not taking: Reported on 11/11/2022) 30 tablet 1 Not Taking   cefdinir (OMNICEF) 300 MG capsule Take 300 mg by mouth 2 (two) times daily. (Patient not taking: Reported on 11/11/2022)   Not Taking   DULoxetine (CYMBALTA) 30 MG capsule Take 30 mg by mouth daily. (Patient not taking: Reported on 07/11/2021)      fluticasone (FLONASE) 50 MCG/ACT nasal spray Place 2 sprays into both nostrils as needed for allergies or rhinitis. (Patient not taking: Reported on 07/11/2021)      gabapentin (NEURONTIN) 100 MG capsule Take 100 mg by mouth at bedtime. (Patient not taking: Reported on 11/11/2022)   Not Taking   Ibuprofen 200 MG CAPS Take 2 tablets by mouth 3 times/day as needed-between meals & bedtime. (Patient not taking: Reported on 11/11/2022)   Not Taking   levocetirizine (XYZAL) 5 MG tablet Take 5 mg by mouth at bedtime. (Patient not taking: Reported on 08/13/2022)   Not Taking   polyethylene glycol (MIRALAX / GLYCOLAX) 17 g packet Take 17 g by mouth daily. (Patient not taking: Reported on 08/13/2022) 14 each 0    senna-docusate (SENOKOT-S) 8.6-50 MG tablet Take 1 tablet by mouth at bedtime as needed for mild constipation. 30 tablet 0 prn   zolpidem (AMBIEN) 5 MG tablet Take 1 tablet (5 mg total) by mouth at bedtime as needed for up to 5 doses for sleep. (Patient not taking: Reported on 07/11/2021) 5 tablet 0 Not Taking   Social History   Socioeconomic History   Marital status: Married    Spouse name: Not on file   Number of children: Not on file   Years of education: Not on file   Highest education level: Not on file  Occupational  History   Not on file  Tobacco Use   Smoking status: Never   Smokeless tobacco: Never  Vaping Use   Vaping status: Never Used  Substance and Sexual Activity   Alcohol use: Yes    Alcohol/week: 21.0 standard drinks of alcohol    Types: 7 Glasses of wine, 14 Cans of beer per week    Comment: occassional   Drug use: No   Sexual activity: Not on file  Other Topics Concern   Not on file  Social History Narrative   Not on file   Social Determinants of Health   Financial Resource Strain: Not on file  Food Insecurity: No Food Insecurity (11/10/2022)   Hunger Vital Sign    Worried About Running Out of Food in the Last Year: Never true    Ran Out of Food in the Last Year: Never true  Transportation Needs: No Transportation Needs (11/10/2022)   PRAPARE - Administrator, Civil Service (Medical): No    Lack of Transportation (Non-Medical): No  Physical Activity: Not on file  Stress: Not on file  Social Connections: Not on file  Intimate Partner Violence: Not At Risk (11/10/2022)   Humiliation, Afraid, Rape, and Kick questionnaire    Fear of Current or Ex-Partner: No    Emotionally Abused: No    Physically Abused: No    Sexually Abused: No    Family History  Problem Relation Age of Onset   Scoliosis Mother    Heart disease Father       Intake/Output Summary (Last 24 hours) at 11/14/2022 0800 Last data filed at 11/14/2022 0553 Gross per 24 hour  Intake 1200.21 ml  Output 2050 ml  Net -849.79 ml    Vitals:   11/13/22 1930 11/14/22 0417 11/14/22 0417 11/14/22 0749  BP: (!) 156/96  (!) 151/83 (!) 147/83  Pulse: 84  88 81  Resp: 18  18 18   Temp: 99 F (37.2 C)  98.6 F (37 C) (!) 97.4 F (36.3 C)  TempSrc: Oral  Oral   SpO2: 97%  91% 95%  Weight:  86.9 kg    Height:        PHYSICAL EXAM General: Pleasant elderly male, well nourished, in no acute distress.  Standing in room with wife at bedside HEENT:  Normocephalic and atraumatic. Neck:  No JVD.  Lungs:  Normal respiratory effort on room air.  Trace bibasilar crackles without appreciable wheezes.   Heart: HRRR . Normal S1 and S2 without gallops or murmurs.  Abdomen: Non-distended appearing adiposity.  Msk: Normal strength and tone for age. Extremities: Trace bilateral lower extremity Neuro: Alert and oriented X 3. Psych:  Answers questions appropriately.   Labs: Basic Metabolic Panel: Recent Labs    11/12/22 0521 11/13/22 0451 11/14/22 0503  NA  --  133*  --   K  --  3.7  --   CL  --  101  --   CO2  --  21*  --   GLUCOSE  --  156*  --   BUN  --  20  --   CREATININE 0.87 0.95 0.82  CALCIUM  --  8.7*  --   MG 2.1  --   --   PHOS 3.1  --   --    Liver Function Tests: No results for input(s): "AST", "ALT", "ALKPHOS", "BILITOT", "PROT", "ALBUMIN" in the last 72 hours. No results for input(s): "LIPASE", "AMYLASE" in the last 72 hours. CBC: Recent Labs    11/13/22 0451 11/14/22 0503  WBC 9.7 8.3  HGB 13.3 13.0  HCT 38.0* 38.1*  MCV 87.0 88.2  PLT 316 317   Cardiac Enzymes: No results for input(s): "CKTOTAL", "CKMB", "CKMBINDEX", "TROPONINIHS" in the last 72 hours. BNP: Recent Labs    11/13/22 0451  BNP 932.6*   D-Dimer: No results for input(s): "DDIMER" in the last 72 hours. Hemoglobin A1C: No results for input(s): "HGBA1C" in the last 72 hours. Fasting Lipid Panel: No results for input(s): "CHOL", "HDL", "LDLCALC", "TRIG", "CHOLHDL", "LDLDIRECT" in the last 72 hours. Thyroid Function Tests: No results for input(s): "TSH", "T4TOTAL", "T3FREE", "THYROIDAB" in the last 72 hours.  Invalid input(s): "FREET3" Anemia Panel: No results for input(s): "VITAMINB12", "FOLATE", "FERRITIN", "TIBC", "IRON", "RETICCTPCT" in the last 72 hours.   Radiology: [facility] Chest Port 1 View  Result Date: 11/12/2022 CLINICAL DATA:  Shortness of breath  EXAM: PORTABLE CHEST 1 VIEW COMPARISON:  Prior chest x-ray 11/09/2021 FINDINGS: Diffuse irregular interstitial prominence with areas of  nodularity throughout the lungs. Small bilateral pleural effusions and associated bibasilar atelectasis. Cardiac and mediastinal contours are within normal limits. Atherosclerotic calcifications are visible in the transverse aorta. No pneumothorax. No acute osseous abnormality. Bilateral glenohumeral joint osteoarthritis. IMPRESSION: Diffuse interstitial prominence with areas of nodularity throughout both lungs. Differential considerations include an acute atypical infectious/inflammatory process such as viral pneumonia versus atypical pulmonary edema. Trace bilateral pleural effusions and associated bibasilar atelectasis. Normal heart size. Electronically Signed   By: Malachy Moan M.D.   On: 11/12/2022 13:39   PERIPHERAL VASCULAR CATHETERIZATION  Result Date: 11/11/2022 See surgical note for result.  DG Foot Complete Right  Result Date: 11/10/2022 CLINICAL DATA:  Toe ulcers.  Evaluate for osteomyelitis. EXAM: RIGHT FOOT COMPLETE - 3+ VIEW COMPARISON:  None Available. FINDINGS: There are no signs of acute fracture or dislocation. No focal bone erosions of osteomyelitis identified. Mild hallux valgus deformity with mild degenerative changes at the first MTP joint. Mild dorsal soft tissue edema. IMPRESSION: 1. No acute findings. No signs of osteomyelitis. 2. 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 Electronically Signed   By: Signa Kell M.D.   On: 11/10/2022 10:00    ECHO 11/10/2018 MILD LV SYSTOLIC DYSFUNCTION WITH AN ESTIMATED EF = 40-45 %  NORMAL RIGHT VENTRICULAR SYSTOLIC FUNCTION  MILD-TO-MODERATE MITRAL VALVE INSUFFICIENCY  TRACE TRICUSPID VALVE INSUFFICIENCY  NO VALVULAR STENOSIS  INFERIOR WALL HYPOKINESIS  _________________________________________________________________________________________  Electronically signed by      MD Arnoldo Hooker on 11/10/2018 10: 43 AM           Performed By: Luretha Murphy, RDCS, RVT     Ordering Physician: [name], MD  Velna Ochs myoview 11/10/2018 Normal Lexiscan infusion EKG  Mild segmental LV systolic dysfunction with inferior hypokinesis  Fixed and minimal reversible inferior myocardial perfusion defect  consistent with mainly previous infarct with peri-infarct myocardial  ischemia  Clinical correlation advised   Arnoldo Hooker   TELEMETRY reviewed by me (LT) 11/14/2022 : Sinus rhythm rate 80s to 90s  EKG reviewed by me: NSR 70, nonspec IVCD, hx old inferior infarct  Data reviewed by me (LT) 11/14/2022: vascular surgery notes, ed note, hospitalist progress notes, psych notes last 24h vitals tele labs imaging I/O    Principal Problem:   Ischemic ulcer of lower extremity (HCC) Active Problems:   Hypertension   Coronary artery disease with history of myocardial infarction without history of CABG   Diabetes mellitus type 2, insulin dependent (HCC)   Diabetic polyneuropathy associated with type 2 diabetes mellitus (HCC)   Mixed hyperlipidemia   Alcohol abuse   Limb ischemia   Acute on chronic combined systolic and diastolic CHF (congestive heart failure) (HCC)    ASSESSMENT AND PLAN:  Manuel [name] is a 63yoM with a PMH of CAD s/p PCI mRCA (3 overlapping Xience stents 2011), extensive PAD s/p PTA L common Iliac, R peroneal, R distal SFA/prox popliteal, and stent to R distal SFA/prox popliteal arteries (05/14/21), 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 Revascularization with Dr. Wyn Quaker was planned on 7/24 but cancelled 2/2 dyspnea and heart failure symptoms. Cardiology is consulted for assistance with the patients heart failure.   # Severe PAD with lower extremity ulceration -Agree with current therapy per primary and vascular surgery -On  broad-spectrum Unasyn  # Acute on chronic HFmrEF Developed dyspnea/orthopnea on 7/23 and was given IV Lasix with improvement, vascular  surgery intervention canceled for 7/24 due to his heart failure exacerbation. Repeat chest x-ray with trace bilateral pleural effusions, BNP was elevated in the 900s.  Continued IV Lasix with clinical improvement, now on room air without orthopnea or shortness of breath while ambulatory in the room. -Increase Lasix to 60 mg twice daily x 2 doses today, likely transition to p.o. Lasix tomorrow -GDMT with Coreg 12.5 mg twice daily, change ACE to losartan 25 mg daily, add spironolactone 12.5 mg daily.  SGLT2i likely outpatient pending recovery from infection as above -Stop home amlodipine 10 mg daily -Echo complete performed this morning, pending read -The patient is at a moderate acceptable risk to proceed with lower extremity revascularization tomorrow, echocardiogram pending and GDMT additions as above.  He is relatively euvolemic on exam with only trace crackles after diuresis yesterday, anticipate he will be optimized from a HF perspective tomorrow morning to proceed with vascular surgery as planned.  This patient's plan of care was discussed and created with Dr. Darrold Junker and he is in agreement.  Signed: Rebeca Allegra , PA-C 11/14/2022, 8:00 AM [facility] Cardiology

## 2022-11-14 NOTE — Progress Notes (Signed)
Progress Note   Patient: Manuel [name] DOB: 13-Jan-1946 DOA: 11/10/2022     4 DOS: the patient was seen and examined on 11/14/2022   Brief hospital course: Taken from H&P.   Manuel [name] is a 77 y.o. male with medical history significant of  insulin-dependent DM, HTN, CAD, PAD s/p angioplasty with stent 05/14/2021 presenting with right lower extremity ischemia and ulcer formation with gangrenous toe.  Presented to the ER afebrile, hemodynamically stable. White count 11.2, hemoglobin 15.2, platelets 303, lactate 1.2, creatinine 0.9. T. bili 1.5. Plain films of the right foot with no signs of osteomyelitis. Dr. [name] with vascular surgery initially consulted with recommendation for heparin drip. Dr. [name] with podiatry also consulted for further evaluation.   7/22: Vital stable.  Going for lower extremity angiography and possible angioplasty with vascular today.  CRP elevated at 10.1, decreased prealbumin at 15 and normal ESR.  Preliminary blood cultures negative.  Continue current broad-spectrum antibiotics.  Found to have very extensive disease and most likely will required bilateral femoral endarterectomies and bilateral iliac stents.  Vascular is looking for a OR time for this extensive procedure which will require 4 to 5 hours.  7/23: Vital and labs stable.  Overnight some shortness of breath and crackles.  One-time 40 mg of IV Lasix was given.  Chest x-ray ordered but patient refused. Patient had a choking spell on meds this morning, continued to feel something sticking in his chest.  No hypoxia.  Chest x-ray with diffuse interstitial prominence and areas of nodularity throughout the both lungs.  Differential include atypical inflammatory process versus atypical pulmonary edema.  Ordered 1 dose of IV Lasix Going for his extensive vascular procedure tomorrow.  7/24: Mildly elevated blood pressure.  BNP elevated at 932, surgery got canceled for concern of acute on chronic  heart failure and patient now need cardiology clearance before proceeding. Family concern of suicidal thoughts, psych was consulted.  7/25: Hemodynamically stable.  Will echocardiogram with EF of 25 to 30% with some regional wall motion abnormalities.  Likely weak going for vascular surgery tomorrow, pending final recommendations from cardiology. Psychiatry with no suicidal thoughts.  Assessment and Plan: * Ischemic ulcer of lower extremity (HCC) Worsening right lower extremity redness swelling and pain over the past 2 weeks in the setting of baseline limb ischemia status post angioplasty and stenting January 2023.Noted worsening ulcer formation across multiple digits of the foot with concern for gangrene Patient reports compliance with Plavix and statin therapy Started on heparin drip in the ER  Vascular procedure with extensive peripheral vascular disease, second procedure for bilateral endarterectomies and bilateral iliac stent placement with vascular surgery was supposed to be today and got canceled last minute due to concern of acute on chronic heart failure.  Patient need cardiology clearance per anesthesia -Broad-spectrum antibiotics switched with Augmentin -Podiatry will decide about the appropriate procedure after vascular -Vascular to reschedule procedure after cardiology clearance    Diabetic polyneuropathy associated with type 2 diabetes mellitus (HCC) Cont neurontin    Coronary artery disease with history of myocardial infarction without history of CABG Followed by Dr. [name] outpt  CAD s/p PCI to [facility] with overlapping 3 mm Xience stents of 23 mm and 12 mm length in 2011  No active chest pain On plavix monotherapy and statin chronically  On heparin drip in the setting of right lower extremity ischemia Follow  Hypertension BP stable  Titrate home regimen    Diabetes mellitus type 2, insulin dependent (HCC)  SSI   Alcohol abuse 3-4 beer daily drinker + mixed  drinks  Will place on CIWA protocol  Follow   Mixed hyperlipidemia Continue statin  Adjustment disorder with depressed mood Family was concerned about some suicidal thoughts so psychiatry was consulted. They cleared him today as patient denies any suicidal thoughts, stating that he was just frustrated  Acute on chronic combined systolic and diastolic CHF (congestive heart failure) (HCC) According to a prior echo done in 2020, EF of 40 to 45%. Patient with concern of shortness of breath, mild hypoxia and chest x-ray with bilateral opacities.  BNP elevated at 932. Repeat echocardiogram with further decrease of EF to 20 to 25% with regional wall motion abnormalities. Cardiology is on board-pending final recommendations -Continue with IV Lasix -Daily weight and BMP -Strict intake and output   Subjective: Patient was eating breakfast when seen today.  Feeling much improved no new concern..  Physical Exam: Vitals:   11/13/22 1930 11/14/22 0417 11/14/22 0417 11/14/22 0749  BP: (!) 156/96  (!) 151/83 (!) 147/83  Pulse: 84  88 81  Resp: 18  18 18   Temp: 99 F (37.2 C)  98.6 F (37 C) (!) 97.4 F (36.3 C)  TempSrc: Oral  Oral   SpO2: 97%  91% 95%  Weight:  86.9 kg    Height:       General.  Frail elderly man, in no acute distress. Pulmonary.  Lungs clear bilaterally, normal respiratory effort. CV.  Regular rate and rhythm, no JVD, rub or murmur. Abdomen.  Soft, nontender, nondistended, BS positive. CNS.  Alert and oriented .  No focal neurologic deficit. Extremities.  No edema, no cyanosis, pulses intact and symmetrical. Psychiatry.  Judgment and insight appears normal.   Data Reviewed: Prior data reviewed  Family Communication: Discussed with wife at bedside  Disposition: Status is: Inpatient Remains inpatient appropriate because: Severity of illness  Planned Discharge Destination:  To be determined  Time spent: 46 minutes  This record has been created using [employer]. Errors have been sought and corrected,but may not always be located. Such creation errors do not reflect on the standard of care.   Author: Arnetha Courser, MD 11/14/2022 3:10 PM  For on call review [email or web address].

## 2022-11-14 NOTE — Consult Note (Signed)
[facility] Face-to-Face Psychiatry Consult   Reason for Consult:  suicidal ideations related to leg amputation Referring Physician:  Dr Nelson Chimes Patient Identification: Manuel [name] MRN:  [id number] Principal Diagnosis: Ischemic ulcer of lower extremity (HCC) Diagnosis:  Principal Problem:   Ischemic ulcer of lower extremity (HCC) Active Problems:   Adjustment disorder with depressed mood   Hypertension   Coronary artery disease with history of myocardial infarction without history of CABG   Diabetes mellitus type 2, insulin dependent (HCC)   Diabetic polyneuropathy associated with type 2 diabetes mellitus (HCC)   Mixed hyperlipidemia   Alcohol abuse   Limb ischemia   Acute on chronic combined systolic and diastolic CHF (congestive heart failure) (HCC)   Total Time spent with patient: 1 hour  Subjective:   Manuel [name] is a 77 y.o. male patient admitted with bilateral leg pain, ischemia; consult for suicidal ideations.  HPI:  77 yo male admitted for leg ischemia and determined his foot needs to be amputated.  At first, he was contemplating his good life and did he want to deal with this with some suicidal ideations.  Today, he stated, "I'm over that, that was yesterday. I will feel better when my legs feel better.  When I'm feeling better, life will be better.  I'm looking forward to feeling better.  I'm optimistic and looking forward to the future."  He chose to have his wife and son at his bedside during the assessment and they had no concerns.  The client was jovial and his family are supportive.  When inquired about his depression, she stated, "I never thought my depression is anything to get excited about."  Discussed Lexapro that his wife is taking which helps her and he declined.  He tried an antidepressant in the past and felt if did not do anything.  Denies anxiety, "I've never been much of a worrier."  Sleep is fair, some nights are good and others are not. Melatonin helps at times.   Appetite is fair.  No hallucinations, paranoia, homicidal ideations, or substance abuse; past use of alcohol.  The family confirmed no guns are in the home.  Past Psychiatric History: depression  Risk to Self:  none Risk to Others:  none Prior Inpatient Therapy:  none Prior Outpatient Therapy:  none  Past Medical History:  Past Medical History:  Diagnosis Date   Allergies    Arthritis    Diabetes mellitus without complication (HCC)    Gout    Hard of hearing    Hyperlipidemia    Hypertension    Myocardial infarction [facility]) 2008   treated here at [facility] and transferred to [place] for stent placement   Neuropathy    knees down   Peripheral vascular disease (HCC)    Sleep apnea    "in the past"   Status post primary angioplasty with coronary stent    Wears dentures    full upper and lower    Past Surgical History:  Procedure Laterality Date   CATARACT EXTRACTION W/PHACO Right 03/14/2021   Procedure: CATARACT EXTRACTION PHACO AND INTRAOCULAR LENS PLACEMENT (IOC) RIGHT DIABETIC;  Surgeon: Lockie Mola, MD;  Location: [facility];  Service: Ophthalmology;  Laterality: Right;  Diabetic 16.78 01:39.9   CATARACT EXTRACTION W/PHACO Left 03/28/2021   Procedure: CATARACT EXTRACTION PHACO AND INTRAOCULAR LENS PLACEMENT (IOC) LEFT DIABETIC 6.93 01:22.0;  Surgeon: Lockie Mola, MD;  Location: [facility];  Service: Ophthalmology;  Laterality: Left;  Diabetic   LOWER EXTREMITY ANGIOGRAPHY Right  05/14/2021   Procedure: LOWER EXTREMITY ANGIOGRAPHY;  Surgeon: Annice Needy, MD;  Location: [facility] INVASIVE CV LAB;  Service: Cardiovascular;  Laterality: Right;   LOWER EXTREMITY ANGIOGRAPHY Right 06/14/2021   Procedure: Lower Extremity Angiography;  Surgeon: Annice Needy, MD;  Location: [facility] INVASIVE CV LAB;  Service: Cardiovascular;  Laterality: Right;   LOWER EXTREMITY ANGIOGRAPHY Right 11/11/2022   Procedure: Lower Extremity Angiography;  Surgeon: Annice Needy, MD;   Location: [facility] INVASIVE CV LAB;  Service: Cardiovascular;  Laterality: Right;   LOWER EXTREMITY INTERVENTION Right 06/15/2021   Procedure: LOWER EXTREMITY INTERVENTION;  Surgeon: Annice Needy, MD;  Location: [facility] INVASIVE CV LAB;  Service: Cardiovascular;  Laterality: Right;   TONSILLECTOMY     had removed as a child   Family History:  Family History  Problem Relation Age of Onset   Scoliosis Mother    Heart disease Father    Family Psychiatric  History: see above Social History:  Social History   Substance and Sexual Activity  Alcohol Use Yes   Alcohol/week: 21.0 standard drinks of alcohol   Types: 7 Glasses of wine, 14 Cans of beer per week   Comment: occassional     Social History   Substance and Sexual Activity  Drug Use No    Social History   Socioeconomic History   Marital status: Married    Spouse name: Not on file   Number of children: Not on file   Years of education: Not on file   Highest education level: Not on file  Occupational History   Not on file  Tobacco Use   Smoking status: Never   Smokeless tobacco: Never  Vaping Use   Vaping status: Never Used  Substance and Sexual Activity   Alcohol use: Yes    Alcohol/week: 21.0 standard drinks of alcohol    Types: 7 Glasses of wine, 14 Cans of beer per week    Comment: occassional   Drug use: No   Sexual activity: Not on file  Other Topics Concern   Not on file  Social History Narrative   Not on file   Social Determinants of Health   Financial Resource Strain: Not on file  Food Insecurity: No Food Insecurity (11/10/2022)   Hunger Vital Sign    Worried About Running Out of Food in the Last Year: Never true    Ran Out of Food in the Last Year: Never true  Transportation Needs: No Transportation Needs (11/10/2022)   PRAPARE - Administrator, Civil Service (Medical): No    Lack of Transportation (Non-Medical): No  Physical Activity: Not on file  Stress: Not on file  Social Connections: Not  on file   Additional Social History:    Allergies:  No Known Allergies  Labs:  Results for orders placed or performed during the hospital encounter of 11/10/22 (from the past 48 hour(s))  Glucose, capillary     Status: Abnormal   Collection Time: 11/12/22  4:17 PM  Result Value Ref Range   Glucose-Capillary 157 (H) 70 - 99 mg/dL    Comment: Glucose reference range applies only to samples taken after fasting for at least 8 hours.  Glucose, capillary     Status: Abnormal   Collection Time: 11/12/22 10:28 PM  Result Value Ref Range   Glucose-Capillary 120 (H) 70 - 99 mg/dL    Comment: Glucose reference range applies only to samples taken after fasting for at least 8 hours.  Heparin level (unfractionated)  Status: None   Collection Time: 11/12/22 10:29 PM  Result Value Ref Range   Heparin Unfractionated 0.44 0.30 - 0.70 IU/mL    Comment: (NOTE) The clinical reportable range upper limit is being lowered to >1.10 to align with the FDA approved guidance for the current laboratory assay.  If heparin results are below expected values, and patient dosage has  been confirmed, suggest follow up testing of antithrombin III levels. Performed at [facility], [address] [place], [place] [address]   CBC     Status: Abnormal   Collection Time: 11/13/22  4:51 AM  Result Value Ref Range   WBC 9.7 4.0 - 10.5 K/uL   RBC 4.37 4.22 - 5.81 MIL/uL   Hemoglobin 13.3 13.0 - 17.0 g/dL   HCT 60.4 (L) 54.0 - 98.1 %   MCV 87.0 80.0 - 100.0 fL   MCH 30.4 26.0 - 34.0 pg   MCHC 35.0 30.0 - 36.0 g/dL   RDW 19.1 47.8 - 29.5 %   Platelets 316 150 - 400 K/uL   nRBC 0.0 0.0 - 0.2 %    Comment: Performed at [facility], [address], [place], [place] [address]  Brain natriuretic peptide     Status: Abnormal   Collection Time: 11/13/22  4:51 AM  Result Value Ref Range   B Natriuretic Peptide 932.6 (H) 0.0 - 100.0 pg/mL    Comment: Performed at [facility], [address] [place], [place] [address]  Heparin level (unfractionated)     Status: None   Collection Time: 11/13/22  4:51 AM  Result Value Ref Range   Heparin Unfractionated 0.33 0.30 - 0.70 IU/mL    Comment: (NOTE) The clinical reportable range upper limit is being lowered to >1.10 to align with the FDA approved guidance for the current laboratory assay.  If heparin results are below expected values, and patient dosage has  been confirmed, suggest follow up testing of antithrombin III levels. Performed at [facility], [address] [place], [place] [address]   Basic metabolic panel     Status: Abnormal   Collection Time: 11/13/22  4:51 AM  Result Value Ref Range   Sodium 133 (L) 135 - 145 mmol/L   Potassium 3.7 3.5 - 5.1 mmol/L   Chloride 101 98 - 111 mmol/L   CO2 21 (L) 22 - 32 mmol/L   Glucose, Bld 156 (H) 70 - 99 mg/dL    Comment: Glucose reference range applies only to samples taken after fasting for at least 8 hours.   BUN 20 8 - 23 mg/dL   Creatinine, Ser 9.52 0.61 - 1.24 mg/dL   Calcium 8.7 (L) 8.9 - 10.3 mg/dL   GFR, Estimated >84 >13 mL/min    Comment: (NOTE) Calculated using the CKD-EPI Creatinine Equation (2021)    Anion gap 11 5 - 15    Comment: Performed at [facility], [address] [place], [place] [address]  Glucose, capillary     Status: Abnormal   Collection Time: 11/13/22  7:05 AM  Result Value Ref Range   Glucose-Capillary 140 (H) 70 - 99 mg/dL    Comment: Glucose reference range applies only to samples taken after fasting for at least 8 hours.  Glucose, capillary     Status: Abnormal   Collection Time: 11/13/22 12:51 PM  Result Value Ref Range   Glucose-Capillary 179 (H) 70 - 99 mg/dL    Comment: Glucose reference range applies only to samples taken  after fasting for at least 8 hours.  Glucose, capillary     Status: Abnormal   Collection Time: 11/13/22  4:18 PM  Result Value Ref Range   Glucose-Capillary 149 (H) 70 - 99  mg/dL    Comment: Glucose reference range applies only to samples taken after fasting for at least 8 hours.  Heparin level (unfractionated)     Status: None   Collection Time: 11/13/22  4:42 PM  Result Value Ref Range   Heparin Unfractionated 0.36 0.30 - 0.70 IU/mL    Comment: (NOTE) The clinical reportable range upper limit is being lowered to >1.10 to align with the FDA approved guidance for the current laboratory assay.  If heparin results are below expected values, and patient dosage has  been confirmed, suggest follow up testing of antithrombin III levels. Performed at [facility], [address] [place], [place] [address]   Glucose, capillary     Status: Abnormal   Collection Time: 11/13/22  9:18 PM  Result Value Ref Range   Glucose-Capillary 116 (H) 70 - 99 mg/dL    Comment: Glucose reference range applies only to samples taken after fasting for at least 8 hours.  CBC     Status: Abnormal   Collection Time: 11/14/22  5:03 AM  Result Value Ref Range   WBC 8.3 4.0 - 10.5 K/uL   RBC 4.32 4.22 - 5.81 MIL/uL   Hemoglobin 13.0 13.0 - 17.0 g/dL   HCT 25.9 (L) 56.3 - 87.5 %   MCV 88.2 80.0 - 100.0 fL   MCH 30.1 26.0 - 34.0 pg   MCHC 34.1 30.0 - 36.0 g/dL   RDW 64.3 32.9 - 51.8 %   Platelets 317 150 - 400 K/uL   nRBC 0.0 0.0 - 0.2 %    Comment: Performed at [facility] [address] [place], [place] [address]  Heparin level (unfractionated)     Status: Abnormal   Collection Time: 11/14/22  5:03 AM  Result Value Ref Range   Heparin Unfractionated 0.27 (L) 0.30 - 0.70 IU/mL    Comment: (NOTE) The clinical reportable range upper limit is being lowered to >1.10 to align with the FDA approved guidance for the current laboratory assay.  If heparin results are below expected values, and patient dosage has  been confirmed, suggest follow up testing of antithrombin III levels. Performed at [facility], [address] [place], [place]  [address]   Creatinine, serum     Status: None   Collection Time: 11/14/22  5:03 AM  Result Value Ref Range   Creatinine, Ser 0.82 0.61 - 1.24 mg/dL   GFR, Estimated >60 >10 mL/min    Comment: (NOTE) Calculated using the CKD-EPI Creatinine Equation (2021) Performed at [facility], [address] [place], [place] [address]   Basic metabolic panel     Status: Abnormal   Collection Time: 11/14/22  5:03 AM  Result Value Ref Range   Sodium 132 (L) 135 - 145 mmol/L   Potassium 3.6 3.5 - 5.1 mmol/L   Chloride 104 98 - 111 mmol/L   CO2 22 22 - 32 mmol/L   Glucose, Bld 113 (H) 70 - 99 mg/dL    Comment: Glucose reference range applies only to samples taken after fasting for at least 8 hours.   BUN 19 8 - 23 mg/dL   Creatinine, Ser 5.73 0.61 - 1.24 mg/dL   Calcium 8.4 (L) 8.9 - 10.3 mg/dL   GFR, Estimated >22 >02  mL/min    Comment: (NOTE) Calculated using the CKD-EPI Creatinine Equation (2021)    Anion gap 6 5 - 15    Comment: Performed at [facility] [address] [place], [place] [address]  Glucose, capillary     Status: Abnormal   Collection Time: 11/14/22  7:47 AM  Result Value Ref Range   Glucose-Capillary 101 (H) 70 - 99 mg/dL    Comment: Glucose reference range applies only to samples taken after fasting for at least 8 hours.  Glucose, capillary     Status: Abnormal   Collection Time: 11/14/22 11:39 AM  Result Value Ref Range   Glucose-Capillary 143 (H) 70 - 99 mg/dL    Comment: Glucose reference range applies only to samples taken after fasting for at least 8 hours.    Current Facility-Administered Medications  Medication Dose Route Frequency Provider Last Rate Last Admin   0.9 %  sodium chloride infusion   Intravenous PRN Annice Needy, MD   Stopped at 11/10/22 2312   Ampicillin-Sulbactam (UNASYN) 3 g in sodium chloride 0.9 % 100 mL IVPB  3 g Intravenous Q6H Arnetha Courser, MD 200 mL/hr at 11/14/22 1202 3 g at 11/14/22 1202   ascorbic acid (VITAMIN C)  tablet 500 mg  500 mg Oral BID Arnetha Courser, MD   500 mg at 11/14/22 0951   carvedilol (COREG) tablet 12.5 mg  12.5 mg Oral BID Arnetha Courser, MD   12.5 mg at 11/14/22 6644   Chlorhexidine Gluconate Cloth 2 % PADS 6 each  6 each Topical Daily Annice Needy, MD   6 each at 11/13/22 0857   folic acid (FOLVITE) tablet 1 mg  1 mg Oral Daily Annice Needy, MD   1 mg at 11/14/22 0951   furosemide (LASIX) injection 60 mg  60 mg Intravenous BID Rebeca Allegra, PA-C   60 mg at 11/14/22 0949   gabapentin (NEURONTIN) capsule 800 mg  800 mg Oral BID Arnetha Courser, MD   800 mg at 11/14/22 0951   heparin ADULT infusion 100 units/mL (25000 units/214mL)  2,250 Units/hr Intravenous Continuous Otelia Sergeant, RPH 22.5 mL/hr at 11/14/22 0722 2,250 Units/hr at 11/14/22 0722   insulin aspart (novoLOG) injection 0-9 Units  0-9 Units Subcutaneous TID WC Annice Needy, MD   1 Units at 11/14/22 1200   losartan (COZAAR) tablet 25 mg  25 mg Oral Daily Rebeca Allegra, PA-C   25 mg at 11/14/22 0347   morphine (PF) 2 MG/ML injection 2 mg  2 mg Intravenous Q2H PRN Annice Needy, MD   2 mg at 11/12/22 1600   multivitamin with minerals tablet 1 tablet  1 tablet Oral Daily Annice Needy, MD   1 tablet at 11/14/22 0954   mupirocin ointment (BACTROBAN) 2 % 1 Application  1 Application Nasal BID Annice Needy, MD   1 Application at 11/14/22 [phone]   oxyCODONE (Oxy IR/ROXICODONE) immediate release tablet 10 mg  10 mg Oral Q4H PRN Annice Needy, MD   10 mg at 11/13/22 2222   potassium chloride SA (KLOR-CON M) CR tablet 40 mEq  40 mEq Oral BID Rebeca Allegra, PA-C   40 mEq at 11/14/22 5638   protein supplement (ENSURE MAX) liquid  11 oz Oral BID Arnetha Courser, MD   11 oz at 11/14/22 0954   spironolactone (ALDACTONE) tablet 12.5 mg  12.5 mg Oral Daily Rebeca Allegra, PA-C   12.5 mg at 11/14/22 [phone]  thiamine (VITAMIN B1) tablet 100 mg  100 mg Oral Daily Annice Needy, MD   100 mg at 11/14/22 2542   Or   thiamine (VITAMIN B1)  injection 100 mg  100 mg Intravenous Daily Dew, Marlow Baars, MD       Psychiatric Specialty Exam: Physical Exam Vitals and nursing note reviewed.  Constitutional:      Appearance: Normal appearance.  HENT:     Head: Normocephalic.     Nose: Nose normal.  Pulmonary:     Effort: Pulmonary effort is normal.  Musculoskeletal:     Cervical back: Normal range of motion.  Neurological:     General: No focal deficit present.     Mental Status: He is alert and oriented to person, place, and time.  Psychiatric:        Attention and Perception: Attention and perception normal.        Mood and Affect: Mood is depressed.        Speech: Speech normal.        Behavior: Behavior normal. Behavior is cooperative.        Thought Content: Thought content normal.        Cognition and Memory: Cognition and memory normal.        Judgment: Judgment normal.     Comments: Minimal depression, situational     Review of Systems  Musculoskeletal:        Bilateral leg pain  Psychiatric/Behavioral:  Positive for depression.   All other systems reviewed and are negative.   Blood pressure (!) 147/83, pulse 81, temperature (!) 97.4 F (36.3 C), resp. rate 18, height 5\' 8"  (1.727 m), weight 86.9 kg, SpO2 95%.Body mass index is 29.13 kg/m.  General Appearance: Casual  Eye Contact:  Good  Speech:  Normal Rate  Volume:  Normal  Mood:  Depressed, situational, low  Affect:  Non-Congruent  Thought Process:  Coherent  Orientation:  Full (Time, Place, and Person)  Thought Content:  WDL and Logical  Suicidal Thoughts:  No  Homicidal Thoughts:  No  Memory:  Immediate;   Good Recent;   Good Remote;   Good  Judgement:  Good  Insight:  Good  Psychomotor Activity:  Decreased  Concentration:  Concentration: Good and Attention Span: Good  Recall:  Good  Fund of Knowledge:  Good  Language:  Good  Akathisia:  No  Handed:  Right  AIMS (if indicated):     Assets:  Housing Intimacy Leisure Time Resilience Social  Support  ADL's:  Intact  Cognition:  WNL  Sleep:        Physical Exam: Physical Exam Vitals and nursing note reviewed.  Constitutional:      Appearance: Normal appearance.  HENT:     Head: Normocephalic.     Nose: Nose normal.  Pulmonary:     Effort: Pulmonary effort is normal.  Musculoskeletal:     Cervical back: Normal range of motion.  Neurological:     General: No focal deficit present.     Mental Status: He is alert and oriented to person, place, and time.  Psychiatric:        Attention and Perception: Attention and perception normal.        Mood and Affect: Mood is depressed.        Speech: Speech normal.        Behavior: Behavior normal. Behavior is cooperative.        Thought Content: Thought content normal.  Cognition and Memory: Cognition and memory normal.        Judgment: Judgment normal.     Comments: Minimal depression, situational    Review of Systems  Musculoskeletal:        Bilateral leg pain  Psychiatric/Behavioral:  Positive for depression.   All other systems reviewed and are negative.  Blood pressure (!) 147/83, pulse 81, temperature (!) 97.4 F (36.3 C), resp. rate 18, height 5\' 8"  (1.727 m), weight 86.9 kg, SpO2 95%. Body mass index is 29.13 kg/m.  Treatment Plan Summary: The client was admitted for bilateral leg pain and was ambivalent about surgery at his age as he had a good life with uncertainty about going on yesterday when told his foot needed amputated.   These feelings resolved and he is looking forward to feeling better, "optimistic" about the future.  Declined any antidepressant, he feels his mood will get better when his physical pain improves after surgery. Adjustment disorder with depressed mood: Recommend Lexapro 10 mg daily IF he feels he needs it later on  Disposition: No evidence of imminent risk to self or others at present.   Patient does not meet criteria for psychiatric inpatient admission. Supportive therapy provided  about ongoing stressors.  Nanine Means, NP 11/14/2022 3:03 PM

## 2022-11-14 NOTE — Care Management Important Message (Signed)
Important Message  Patient Details  Name: Manuel [name] MRN: [id number] Date of Birth: 13-Jan-1946   Medicare Important Message Given:  N/A - LOS <3 / Initial given by admissions     Johnell Comings 11/14/2022, 12:04 PM

## 2022-11-14 NOTE — TOC Progression Note (Signed)
Transition of Care [facility]) - Progression Note    Patient Details  Name: Manuel [name] MRN: [id number] Date of Birth: 05-Aug-1945  Transition of Care [facility]) CM/SW Contact  Chapman Fitch, RN Phone Number: 11/14/2022, 2:28 PM  Clinical Narrative:     Per vascular will try to schedule patient for surgery for tomorrow  Patient states that he lives at home.  Does not have any DME.  Patient is aware that therapy will see post op and TOC will follow for dc needs   Expected Discharge Plan: Home w Home Health Services    Expected Discharge Plan and Services                                               Social Determinants of Health (SDOH) Interventions SDOH Screenings   Food Insecurity: No Food Insecurity (11/10/2022)  Housing: Low Risk  (11/10/2022)  Transportation Needs: No Transportation Needs (11/10/2022)  Utilities: Not At Risk (11/10/2022)  Tobacco Use: Low Risk  (11/13/2022)  Recent Concern: Tobacco Use - Medium Risk (09/09/2022)   [facility]    Readmission Risk Interventions     No data to display

## 2022-11-14 NOTE — Consult Note (Signed)
ANTICOAGULATION CONSULT NOTE  Pharmacy Consult for Heparin Infusion Indication: chest pain/ACS  No Known Allergies  Patient Measurements: Height: 5\' 8"  (172.7 cm) Weight: 86.9 kg (191 lb 9.3 oz) IBW/kg (Calculated) : 68.4 Heparin Dosing Weight: 85.7 kg  Vital Signs: Temp: 97.4 F (36.3 C) (07/25 0749) Temp Source: Oral (07/25 0417) BP: 147/83 (07/25 0749) Pulse Rate: 81 (07/25 0749)  Labs: Recent Labs    11/12/22 0521 11/12/22 1426 11/13/22 0451 11/13/22 1642 11/14/22 0503 11/14/22 1446  HGB 12.8*  --  13.3  --  13.0  --   HCT 36.3*  --  38.0*  --  38.1*  --   PLT 298  --  316  --  317  --   HEPARINUNFRC 0.29*   < > 0.33 0.36 0.27* 0.47  CREATININE 0.87  --  0.95  --  0.79  0.82  --    < > = values in this interval not displayed.    Estimated Creatinine Clearance: 78.3 mL/min (by C-G formula based on SCr of 0.82 mg/dL).   Medical History: Past Medical History:  Diagnosis Date   Allergies    Arthritis    Diabetes mellitus without complication (HCC)    Gout    Hard of hearing    Hyperlipidemia    Hypertension    Myocardial infarction [facility]) 2008   treated here at [facility] and transferred to [place] for stent placement   Neuropathy    knees down   Peripheral vascular disease (HCC)    Sleep apnea    "in the past"   Status post primary angioplasty with coronary stent    Wears dentures    full upper and lower    Assessment: Manuel [name] is a 77 y.o. male with past medical history of PVD s/p mechanical thrombectomy of right lower extremity in February 2023, CAD, T2DM, HLD, neuropathy. He was on a DOAC previously, but is not on anticoagulant therapy currently. He is here with pain to the right foot and a black toe on the right foot starting approximately 3 days ago. Vascular surgery consulted and planning angiography 7/22. Pharmacy has been consulted to initiate and manage heparin infusion.   7/25 1446 HL 0.47.    Goal of Therapy:  Heparin level 0.3-0.7  units/ml Monitor platelets by anticoagulation protocol: Yes  Plan:  Heparin level is therapeutic. Will continue heparin infusion at 2250 units/hr. Recheck heparin level and CBC with AM labs.   Paschal Dopp, PharmD 11/14/2022 3:26 PM

## 2022-11-14 NOTE — Progress Notes (Signed)
Patient feeling much better today.  Breathing comfortably after some diuresis.  Appreciate medical and cardiology input in improving his status.  Echocardiogram being performed as I was in the room and results pending.  He and his wife are aware of the serious nature of the situation and the high risk of limb loss.  We had a long discussion today regarding the planned surgery of bilateral femoral endarterectomies, bilateral iliac stent placement, and likely right popliteal and tibial intervention as well.  We discussed the risks and benefits of the procedure.  We discussed that without procedure, limb loss is inevitable.  He and his wife desire to proceed with surgery and we will try to get this scheduled for tomorrow.

## 2022-11-14 NOTE — Consult Note (Signed)
ANTICOAGULATION CONSULT NOTE  Pharmacy Consult for Heparin Infusion Indication: chest pain/ACS  No Known Allergies  Patient Measurements: Height: 5\' 8"  (172.7 cm) Weight: 86.9 kg (191 lb 9.3 oz) IBW/kg (Calculated) : 68.4 Heparin Dosing Weight: 85.7 kg  Vital Signs: Temp: 97.4 F (36.3 C) (07/25 0749) Temp Source: Oral (07/25 0417) BP: 147/83 (07/25 0749) Pulse Rate: 81 (07/25 0749)  Labs: Recent Labs    11/12/22 0521 11/12/22 1426 11/13/22 0451 11/13/22 1642 11/14/22 0503 11/14/22 1446  HGB 12.8*  --  13.3  --  13.0  --   HCT 36.3*  --  38.0*  --  38.1*  --   PLT 298  --  316  --  317  --   HEPARINUNFRC 0.29*   < > 0.33 0.36 0.27* 0.47  CREATININE 0.87  --  0.95  --  0.79  0.82  --    < > = values in this interval not displayed.    Estimated Creatinine Clearance: 78.3 mL/min (by C-G formula based on SCr of 0.82 mg/dL).   Medical History: Past Medical History:  Diagnosis Date   Allergies    Arthritis    Diabetes mellitus without complication (HCC)    Gout    Hard of hearing    Hyperlipidemia    Hypertension    Myocardial infarction [facility]) 2008   treated here at [facility] and transferred to [place] for stent placement   Neuropathy    knees down   Peripheral vascular disease (HCC)    Sleep apnea    "in the past"   Status post primary angioplasty with coronary stent    Wears dentures    full upper and lower    Assessment: Manuel [name] is a 77 y.o. male with past medical history of PVD s/p mechanical thrombectomy of right lower extremity in February 2023, CAD, T2DM, HLD, neuropathy. He was on a DOAC previously, but is not on anticoagulant therapy currently. He is here with pain to the right foot and a black toe on the right foot starting approximately 3 days ago. Vascular surgery consulted and planning angiography 7/22. Pharmacy has been consulted to initiate and manage heparin infusion.   Baseline Labs: aPTT 32, INR 1.1, Hgb 15.2, Plt 303  Goal of  Therapy:  Heparin level 0.3-0.7 units/ml Monitor platelets by anticoagulation protocol: Yes  07/25 1446 HL 0.47, therapeutic x 1  Plan:  Anti-Xa level therapeutic x 1 Continue heparin infusion at 2250 units/hr Recheck HL in 8 hrs to confirm Daily CBC while on heparin  [name], PharmD 11/14/2022 3:23 PM

## 2022-11-14 NOTE — Consult Note (Signed)
ANTICOAGULATION CONSULT NOTE  Pharmacy Consult for Heparin Infusion Indication: chest pain/ACS  No Known Allergies  Patient Measurements: Height: 5\' 8"  (172.7 cm) Weight: 86.9 kg (191 lb 9.3 oz) IBW/kg (Calculated) : 68.4 Heparin Dosing Weight: 85.7 kg  Vital Signs: Temp: 98.6 F (37 C) (07/25 0417) Temp Source: Oral (07/25 0417) BP: 151/83 (07/25 0417) Pulse Rate: 88 (07/25 0417)  Labs: Recent Labs    11/12/22 0521 11/12/22 1426 11/13/22 0451 11/13/22 1642 11/14/22 0503  HGB 12.8*  --  13.3  --  13.0  HCT 36.3*  --  38.0*  --  38.1*  PLT 298  --  316  --  317  HEPARINUNFRC 0.29*   < > 0.33 0.36 0.27*  CREATININE 0.87  --  0.95  --  0.82   < > = values in this interval not displayed.    Estimated Creatinine Clearance: 78.3 mL/min (by C-G formula based on SCr of 0.82 mg/dL).   Medical History: Past Medical History:  Diagnosis Date   Allergies    Arthritis    Diabetes mellitus without complication (HCC)    Gout    Hard of hearing    Hyperlipidemia    Hypertension    Myocardial infarction [facility]) 2008   treated here at [facility] and transferred to [place] for stent placement   Neuropathy    knees down   Peripheral vascular disease (HCC)    Sleep apnea    "in the past"   Status post primary angioplasty with coronary stent    Wears dentures    full upper and lower    Assessment: Manuel [name] is a 77 y.o. male with past medical history of PVD s/p mechanical thrombectomy of right lower extremity in February 2023, CAD, T2DM, HLD, neuropathy. He was on a DOAC previously, but is not on anticoagulant therapy currently. He is here with pain to the right foot and a black toe on the right foot starting approximately 3 days ago. Vascular surgery consulted and planning angiography 7/22. Pharmacy has been consulted to initiate and manage heparin infusion.   Baseline Labs: aPTT 32, INR 1.1, Hgb 15.2, Plt 303  Goal of Therapy:  Heparin level 0.3-0.7 units/ml Monitor  platelets by anticoagulation protocol: Yes  Plan:  Anti-Xa level is subtherapeutic Bolus 1300 units x 1 Increase heparin infusion to 2250 units/hr Recheck HL in 8 hrs after rate change Daily CBC while on heparin  Otelia Sergeant, PharmD, [facility] 11/14/2022 6:29 AM

## 2022-11-15 ENCOUNTER — Encounter: Payer: Self-pay | Admitting: Family Medicine

## 2022-11-15 ENCOUNTER — Inpatient Hospital Stay: Payer: Medicare Other

## 2022-11-15 ENCOUNTER — Inpatient Hospital Stay: Payer: Medicare Other | Admitting: Anesthesiology

## 2022-11-15 ENCOUNTER — Encounter
Admission: EM | Disposition: A | Discharge status: Home Health | Payer: Self-pay | Source: Home / Self Care | Attending: Internal Medicine

## 2022-11-15 ENCOUNTER — Other Ambulatory Visit: Payer: Self-pay

## 2022-11-15 DIAGNOSIS — I70202 Unspecified atherosclerosis of native arteries of extremities, left leg: Secondary | ICD-10-CM | POA: Diagnosis not present

## 2022-11-15 DIAGNOSIS — I70261 Atherosclerosis of native arteries of extremities with gangrene, right leg: Secondary | ICD-10-CM | POA: Diagnosis not present

## 2022-11-15 DIAGNOSIS — T82868A Thrombosis of vascular prosthetic devices, implants and grafts, initial encounter: Secondary | ICD-10-CM

## 2022-11-15 DIAGNOSIS — M79605 Pain in left leg: Secondary | ICD-10-CM | POA: Diagnosis not present

## 2022-11-15 DIAGNOSIS — Z95828 Presence of other vascular implants and grafts: Secondary | ICD-10-CM | POA: Diagnosis not present

## 2022-11-15 DIAGNOSIS — I708 Atherosclerosis of other arteries: Secondary | ICD-10-CM | POA: Diagnosis not present

## 2022-11-15 DIAGNOSIS — M79672 Pain in left foot: Secondary | ICD-10-CM | POA: Diagnosis not present

## 2022-11-15 DIAGNOSIS — E1142 Type 2 diabetes mellitus with diabetic polyneuropathy: Secondary | ICD-10-CM | POA: Diagnosis not present

## 2022-11-15 DIAGNOSIS — L97909 Non-pressure chronic ulcer of unspecified part of unspecified lower leg with unspecified severity: Secondary | ICD-10-CM | POA: Diagnosis not present

## 2022-11-15 DIAGNOSIS — I998 Other disorder of circulatory system: Secondary | ICD-10-CM | POA: Diagnosis not present

## 2022-11-15 DIAGNOSIS — I7772 Dissection of iliac artery: Secondary | ICD-10-CM

## 2022-11-15 DIAGNOSIS — I2510 Atherosclerotic heart disease of native coronary artery without angina pectoris: Secondary | ICD-10-CM | POA: Diagnosis not present

## 2022-11-15 DIAGNOSIS — I743 Embolism and thrombosis of arteries of the lower extremities: Secondary | ICD-10-CM | POA: Diagnosis not present

## 2022-11-15 DIAGNOSIS — I700 Atherosclerosis of aorta: Secondary | ICD-10-CM | POA: Diagnosis not present

## 2022-11-15 HISTORY — PX: INSERTION OF ILIAC STENT: SHX6256

## 2022-11-15 HISTORY — PX: ENDARTERECTOMY FEMORAL: SHX5804

## 2022-11-15 LAB — GLUCOSE, CAPILLARY
Glucose-Capillary: 129 mg/dL — ABNORMAL HIGH (ref 70–99)
Glucose-Capillary: 129 mg/dL — ABNORMAL HIGH (ref 70–99)
Glucose-Capillary: 178 mg/dL — ABNORMAL HIGH (ref 70–99)
Glucose-Capillary: 163 mg/dL — ABNORMAL HIGH (ref 70–99)
Glucose-Capillary: 168 mg/dL — ABNORMAL HIGH (ref 70–99)
Glucose-Capillary: 167 mg/dL — ABNORMAL HIGH (ref 70–99)

## 2022-11-15 LAB — CBC
HCT: 38.5 % — ABNORMAL LOW (ref 39.0–52.0)
Hemoglobin: 12.7 g/dL — ABNORMAL LOW (ref 13.0–17.0)
MCH: 29.8 pg (ref 26.0–34.0)
MCHC: 33 g/dL (ref 30.0–36.0)
MCV: 90.4 fL (ref 80.0–100.0)
Platelets: 287 10*3/uL (ref 150–400)
RBC: 4.26 MIL/uL (ref 4.22–5.81)
RDW: 13.5 % (ref 11.5–15.5)
RDW: 13.8 % (ref 11.5–15.5)
WBC: 9.8 10*3/uL (ref 4.0–10.5)
nRBC: 0 % (ref 0.0–0.2)

## 2022-11-15 LAB — CREATININE, SERUM
Creatinine, Ser: 0.88 mg/dL (ref 0.61–1.24)
GFR, Estimated: >60 mL/min (ref >60)

## 2022-11-15 SURGERY — ENDARTERECTOMY, FEMORAL
Anesthesia: General | Laterality: Bilateral

## 2022-11-15 MED ORDER — FENTANYL CITRATE (PF) 100 MCG/2ML IJ SOLN
INTRAMUSCULAR | Status: AC
  Filled 2022-11-15: qty 2

## 2022-11-15 MED ORDER — HEMOSTATIC AGENTS (NO CHARGE) OPTIME
TOPICAL | Status: DC | PRN
  Administered 2022-11-15: 1 via TOPICAL

## 2022-11-15 MED ORDER — OXYCODONE-ACETAMINOPHEN 5-325 MG PO TABS
1.0000 | ORAL_TABLET | ORAL | Status: DC | PRN
  Administered 2022-11-17 – 2022-11-18 (×2): 2 via ORAL
  Administered 2022-11-19 – 2022-11-21 (×5): 1 via ORAL
  Filled 2022-11-15 (×2): qty 1
  Filled 2022-11-15: qty 2
  Filled 2022-11-15: qty 1
  Filled 2022-11-15: qty 2
  Filled 2022-11-15 (×2): qty 1

## 2022-11-15 MED ORDER — ACETAMINOPHEN 10 MG/ML IV SOLN
INTRAVENOUS | Status: DC | PRN
  Administered 2022-11-15: 1000 mg via INTRAVENOUS

## 2022-11-15 MED ORDER — NITROGLYCERIN IN D5W 200-5 MCG/ML-% IV SOLN
INTRAVENOUS | Status: AC
  Filled 2022-11-15: qty 250

## 2022-11-15 MED ORDER — DOPAMINE-DEXTROSE 3.2-5 MG/ML-% IV SOLN: 3.0000 ug/kg/min | INTRAVENOUS | Status: DC

## 2022-11-15 MED ORDER — CEFAZOLIN SODIUM-DEXTROSE 2-4 GM/100ML-% IV SOLN
2.0000 g | Freq: Three times a day (TID) | INTRAVENOUS | Status: AC
  Administered 2022-11-15 – 2022-11-16 (×2): 2 g via INTRAVENOUS
  Filled 2022-11-15 (×2): qty 100

## 2022-11-15 MED ORDER — LIDOCAINE HCL (PF) 2 % IJ SOLN
INTRAMUSCULAR | Status: AC
  Filled 2022-11-15: qty 5

## 2022-11-15 MED ORDER — ONDANSETRON HCL 4 MG/2ML IJ SOLN
4.0000 mg | Freq: Four times a day (QID) | INTRAMUSCULAR | Status: DC | PRN
  Filled 2022-11-15 (×2): qty 2

## 2022-11-15 MED ORDER — ENOXAPARIN SODIUM 40 MG/0.4ML IJ SOSY
40.0000 mg | PREFILLED_SYRINGE | INTRAMUSCULAR | Status: DC
  Filled 2022-11-15: qty 0.4

## 2022-11-15 MED ORDER — CHLORHEXIDINE GLUCONATE CLOTH 2 % EX PADS
6.0000 | MEDICATED_PAD | Freq: Every day | CUTANEOUS | Status: AC
  Administered 2022-11-16 – 2022-11-18 (×4): 6 via TOPICAL

## 2022-11-15 MED ORDER — SODIUM CHLORIDE 0.9 % IV SOLN
INTRAVENOUS | Status: DC | PRN
  Administered 2022-11-15: 3 g via INTRAVENOUS

## 2022-11-15 MED ORDER — PROMETHAZINE HCL 25 MG/ML IJ SOLN: 6.2500 mg | INTRAMUSCULAR | Status: DC | PRN

## 2022-11-15 MED ORDER — DOCUSATE SODIUM 100 MG PO CAPS
100.0000 mg | ORAL_CAPSULE | Freq: Every day | ORAL | Status: DC
  Administered 2022-11-16 – 2022-11-21 (×6): 100 mg via ORAL
  Filled 2022-11-15 (×7): qty 1

## 2022-11-15 MED ORDER — SODIUM CHLORIDE 0.9 % IV SOLN: 500.0000 mL | Freq: Once | INTRAVENOUS | Status: DC | PRN

## 2022-11-15 MED ORDER — STERILE WATER FOR IRRIGATION IR SOLN
Status: DC | PRN
  Administered 2022-11-15: 500 mL

## 2022-11-15 MED ORDER — OXYCODONE HCL 5 MG PO TABS
5.0000 mg | ORAL_TABLET | Freq: Once | ORAL | Status: AC | PRN
  Administered 2022-11-15: 5 mg via ORAL

## 2022-11-15 MED ORDER — HEPARIN SODIUM (PORCINE) 1000 UNIT/ML IJ SOLN
INTRAMUSCULAR | Status: DC | PRN
  Administered 2022-11-15: 7000 [IU] via INTRAVENOUS
  Administered 2022-11-15 (×2): 2000 [IU] via INTRAVENOUS

## 2022-11-15 MED ORDER — ACETAMINOPHEN 500 MG PO TABS
500.0000 mg | ORAL_TABLET | Freq: Once | ORAL | Status: DC
Start: 2022-11-15 — End: 2022-11-15

## 2022-11-15 MED ORDER — DIPHENHYDRAMINE HCL 50 MG/ML IJ SOLN
INTRAMUSCULAR | Status: AC
  Filled 2022-11-15: qty 1

## 2022-11-15 MED ORDER — NOREPINEPHRINE 4 MG/250ML-% IV SOLN
INTRAVENOUS | Status: DC | PRN
  Administered 2022-11-15: 2 ug/min via INTRAVENOUS

## 2022-11-15 MED ORDER — NOREPINEPHRINE 4 MG/250ML-% IV SOLN
INTRAVENOUS | Status: AC
  Filled 2022-11-15: qty 250

## 2022-11-15 MED ORDER — ONDANSETRON HCL 4 MG/2ML IJ SOLN
INTRAMUSCULAR | Status: DC | PRN
  Administered 2022-11-15: 4 mg via INTRAVENOUS

## 2022-11-15 MED ORDER — ACETAMINOPHEN 500 MG PO TABS
1000.0000 mg | ORAL_TABLET | Freq: Once | ORAL | Status: DC
  Filled 2022-11-15: qty 2

## 2022-11-15 MED ORDER — LACTATED RINGERS IV SOLN: INTRAVENOUS | Status: DC | PRN

## 2022-11-15 MED ORDER — POTASSIUM CHLORIDE CRYS ER 20 MEQ PO TBCR: 20.0000 meq | EXTENDED_RELEASE_TABLET | Freq: Every day | ORAL | Status: DC | PRN

## 2022-11-15 MED ORDER — SENNOSIDES-DOCUSATE SODIUM 8.6-50 MG PO TABS
1.0000 | ORAL_TABLET | Freq: Every evening | ORAL | Status: DC | PRN
  Administered 2022-11-20 – 2022-11-21 (×2): 1 via ORAL
  Filled 2022-11-15 (×3): qty 1

## 2022-11-15 MED ORDER — PROTAMINE SULFATE 10 MG/ML IV SOLN
INTRAVENOUS | Status: AC
  Filled 2022-11-15: qty 25

## 2022-11-15 MED ORDER — ATORVASTATIN CALCIUM 20 MG PO TABS
80.0000 mg | ORAL_TABLET | Freq: Every day | ORAL | Status: DC
  Administered 2022-11-15 – 2022-11-26 (×12): 80 mg via ORAL
  Filled 2022-11-15 (×4): qty 1
  Filled 2022-11-15: qty 4
  Filled 2022-11-15: qty 1
  Filled 2022-11-15 (×6): qty 4

## 2022-11-15 MED ORDER — ALUM & MAG HYDROXIDE-SIMETH 200-200-20 MG/5ML PO SUSP
15.0000 mL | ORAL | Status: DC | PRN
  Administered 2022-11-16 – 2022-11-18 (×2): 30 mL via ORAL
  Filled 2022-11-15 (×2): qty 30

## 2022-11-15 MED ORDER — ACETAMINOPHEN 500 MG PO TABS
ORAL_TABLET | ORAL | Status: AC
  Filled 2022-11-15: qty 2

## 2022-11-15 MED ORDER — SUGAMMADEX SODIUM 200 MG/2ML IV SOLN
INTRAVENOUS | Status: DC | PRN
  Administered 2022-11-15: 200 mg via INTRAVENOUS

## 2022-11-15 MED ORDER — ROCURONIUM BROMIDE 100 MG/10ML IV SOLN
INTRAVENOUS | Status: DC | PRN
  Administered 2022-11-15: 20 mg via INTRAVENOUS
  Administered 2022-11-15 (×2): 50 mg via INTRAVENOUS
  Administered 2022-11-15: 20 mg via INTRAVENOUS

## 2022-11-15 MED ORDER — PHENYLEPHRINE HCL-NACL 20-0.9 MG/250ML-% IV SOLN
INTRAVENOUS | Status: AC
  Filled 2022-11-15: qty 250

## 2022-11-15 MED ORDER — EPHEDRINE SULFATE (PRESSORS) 50 MG/ML IJ SOLN
INTRAMUSCULAR | Status: DC | PRN
  Administered 2022-11-15: 10 mg via INTRAVENOUS
  Administered 2022-11-15: 5 mg via INTRAVENOUS

## 2022-11-15 MED ORDER — ACETAMINOPHEN 10 MG/ML IV SOLN: 1000.0000 mg | Freq: Once | INTRAVENOUS | Status: DC | PRN

## 2022-11-15 MED ORDER — OXYCODONE HCL 5 MG PO TABS
ORAL_TABLET | ORAL | Status: AC
  Filled 2022-11-15: qty 1

## 2022-11-15 MED ORDER — GUAIFENESIN-DM 100-10 MG/5ML PO SYRP: 15.0000 mL | ORAL_SOLUTION | ORAL | Status: DC | PRN

## 2022-11-15 MED ORDER — MAGNESIUM SULFATE 2 GM/50ML IV SOLN: 2.0000 g | Freq: Every day | INTRAVENOUS | Status: DC | PRN

## 2022-11-15 MED ORDER — HEPARIN (PORCINE) 25000 UT/250ML-% IV SOLN
2250.0000 [IU]/h | INTRAVENOUS | Status: DC
  Administered 2022-11-16: 2250 [IU]/h via INTRAVENOUS
  Filled 2022-11-15 (×2): qty 250

## 2022-11-15 MED ORDER — SODIUM CHLORIDE 0.9 % IV SOLN: INTRAVENOUS | Status: DC

## 2022-11-15 MED ORDER — NITROGLYCERIN IN D5W 200-5 MCG/ML-% IV SOLN
5.0000 ug/min | INTRAVENOUS | Status: DC
  Administered 2022-11-15: 5 ug/min via INTRAVENOUS
  Filled 2022-11-15: qty 250

## 2022-11-15 MED ORDER — DROPERIDOL 2.5 MG/ML IJ SOLN: 0.6250 mg | Freq: Once | INTRAMUSCULAR | Status: DC | PRN

## 2022-11-15 MED ORDER — ONDANSETRON HCL 4 MG/2ML IJ SOLN
INTRAMUSCULAR | Status: AC
  Filled 2022-11-15: qty 2

## 2022-11-15 MED ORDER — SORBITOL 70 % SOLN
30.0000 mL | Freq: Every day | Status: DC | PRN
  Administered 2022-11-21: 30 mL via ORAL
  Filled 2022-11-15 (×2): qty 30

## 2022-11-15 MED ORDER — HYDRALAZINE HCL 20 MG/ML IJ SOLN: 5.0000 mg | INTRAMUSCULAR | Status: DC | PRN

## 2022-11-15 MED ORDER — MORPHINE SULFATE (PF) 2 MG/ML IV SOLN
2.0000 mg | INTRAVENOUS | Status: DC | PRN
  Administered 2022-11-15 – 2022-11-16 (×6): 4 mg via INTRAVENOUS
  Administered 2022-11-17 – 2022-11-18 (×2): 2 mg via INTRAVENOUS
  Filled 2022-11-15 (×3): qty 2
  Filled 2022-11-15: qty 1
  Filled 2022-11-15: qty 2
  Filled 2022-11-15: qty 1
  Filled 2022-11-15 (×3): qty 2

## 2022-11-15 MED ORDER — DIPHENHYDRAMINE HCL 50 MG/ML IJ SOLN
INTRAMUSCULAR | Status: DC | PRN
Start: 2022-11-15 — End: 2022-11-15
  Administered 2022-11-15: 12.5 mg via INTRAVENOUS

## 2022-11-15 MED ORDER — HEPARIN SODIUM (PORCINE) 1000 UNIT/ML IJ SOLN
INTRAMUSCULAR | Status: AC
  Filled 2022-11-15: qty 10

## 2022-11-15 MED ORDER — OXYCODONE HCL 5 MG/5ML PO SOLN: 5.0000 mg | Freq: Once | ORAL | Status: AC | PRN

## 2022-11-15 MED ORDER — ASPIRIN 81 MG PO TBEC
81.0000 mg | DELAYED_RELEASE_TABLET | Freq: Every day | ORAL | Status: DC
  Administered 2022-11-16 – 2022-11-19 (×4): 81 mg via ORAL
  Filled 2022-11-15 (×4): qty 1

## 2022-11-15 MED ORDER — METOPROLOL TARTRATE 5 MG/5ML IV SOLN: 2.0000 mg | INTRAVENOUS | Status: DC | PRN

## 2022-11-15 MED ORDER — FENTANYL CITRATE PF 50 MCG/ML IJ SOSY: 12.5000 ug | PREFILLED_SYRINGE | Freq: Once | INTRAMUSCULAR | Status: DC | PRN

## 2022-11-15 MED ORDER — LIDOCAINE HCL (CARDIAC) PF 100 MG/5ML IV SOSY
PREFILLED_SYRINGE | INTRAVENOUS | Status: DC | PRN
  Administered 2022-11-15: 80 mg via INTRAVENOUS

## 2022-11-15 MED ORDER — HYDROMORPHONE HCL 1 MG/ML IJ SOLN
1.0000 mg | Freq: Once | INTRAMUSCULAR | Status: AC | PRN
  Administered 2022-11-15: 1 mg via INTRAVENOUS
  Filled 2022-11-15: qty 1

## 2022-11-15 MED ORDER — PROPOFOL 10 MG/ML IV BOLUS
INTRAVENOUS | Status: DC | PRN
Start: 2022-11-15 — End: 2022-11-15
  Administered 2022-11-15: 120 mg via INTRAVENOUS
  Administered 2022-11-15: 30 mg via INTRAVENOUS

## 2022-11-15 MED ORDER — DEXAMETHASONE SODIUM PHOSPHATE 10 MG/ML IJ SOLN
INTRAMUSCULAR | Status: AC
  Filled 2022-11-15: qty 1

## 2022-11-15 MED ORDER — VISTASEAL 10 ML SINGLE DOSE KIT
PACK | CUTANEOUS | Status: AC
  Filled 2022-11-15: qty 10

## 2022-11-15 MED ORDER — LABETALOL HCL 5 MG/ML IV SOLN
10.0000 mg | INTRAVENOUS | Status: DC | PRN
  Administered 2022-11-15 (×2): 10 mg via INTRAVENOUS
  Filled 2022-11-15: qty 4

## 2022-11-15 MED ORDER — SODIUM CHLORIDE 0.9 % IR SOLN
Status: DC | PRN
  Administered 2022-11-15: 500 mL via SURGICAL_CAVITY

## 2022-11-15 MED ORDER — ACETAMINOPHEN 10 MG/ML IV SOLN
INTRAVENOUS | Status: AC
  Filled 2022-11-15: qty 100

## 2022-11-15 MED ORDER — SODIUM CHLORIDE 0.9 % IV SOLN: INTRAVENOUS | Status: DC | PRN

## 2022-11-15 MED ORDER — HEPARIN 30,000 UNITS/1000 ML (OHS) CELLSAVER SOLUTION
Status: AC | PRN
  Administered 2022-11-15: 1

## 2022-11-15 MED ORDER — ACETAMINOPHEN 325 MG PO TABS
325.0000 mg | ORAL_TABLET | ORAL | Status: DC | PRN
  Administered 2022-11-16: 500 mg via ORAL
  Administered 2022-11-22 – 2022-11-24 (×3): 650 mg via ORAL
  Filled 2022-11-15 (×3): qty 2

## 2022-11-15 MED ORDER — FENTANYL CITRATE (PF) 100 MCG/2ML IJ SOLN
25.0000 ug | INTRAMUSCULAR | Status: DC | PRN
  Administered 2022-11-15 (×3): 50 ug via INTRAVENOUS

## 2022-11-15 MED ORDER — LABETALOL HCL 5 MG/ML IV SOLN
INTRAVENOUS | Status: AC
  Filled 2022-11-15: qty 4

## 2022-11-15 MED ORDER — FENTANYL CITRATE (PF) 100 MCG/2ML IJ SOLN
INTRAMUSCULAR | Status: DC | PRN
  Administered 2022-11-15 (×6): 50 ug via INTRAVENOUS

## 2022-11-15 MED ORDER — ONDANSETRON HCL 4 MG/2ML IJ SOLN
4.0000 mg | Freq: Four times a day (QID) | INTRAMUSCULAR | Status: DC | PRN
  Administered 2022-11-15 – 2022-11-16 (×2): 4 mg via INTRAVENOUS

## 2022-11-15 MED ORDER — FAMOTIDINE IN NACL 20-0.9 MG/50ML-% IV SOLN
20.0000 mg | Freq: Two times a day (BID) | INTRAVENOUS | Status: DC
  Administered 2022-11-15: 20 mg via INTRAVENOUS
  Filled 2022-11-15 (×2): qty 50

## 2022-11-15 MED ORDER — PHENOL 1.4 % MT LIQD: 1.0000 | OROMUCOSAL | Status: DC | PRN

## 2022-11-15 MED ORDER — ACETAMINOPHEN 650 MG RE SUPP: 325.0000 mg | RECTAL | Status: DC | PRN

## 2022-11-15 MED ORDER — VISTASEAL 10 ML SINGLE DOSE KIT
PACK | CUTANEOUS | Status: DC | PRN
  Administered 2022-11-15: 10 mL via TOPICAL

## 2022-11-15 SURGICAL SUPPLY — 103 items
ADH SKN CLS APL DERMABOND .7 (GAUZE/BANDAGES/DRESSINGS)
AGENT HMST PWDR BTL CLGN 5GM (Miscellaneous) ×1 IMPLANT
APL PRP STRL LF DISP 70% ISPRP (MISCELLANEOUS) ×1
APPLIER CLIP 11 MED OPEN (CLIP)
APPLIER CLIP 9.375 SM OPEN (CLIP)
APR CLP MED 11 20 MLT OPN (CLIP)
APR CLP SM 9.3 20 MLT OPN (CLIP)
BAG DECANTER FOR FLEXI CONT (MISCELLANEOUS) ×1 IMPLANT
BAG ISL LRG 20X20 DRWSTRG (DRAPES)
BAG ISOLATATION DRAPE 20X20 ST (DRAPES) IMPLANT
BALLN ATG 14X6X80 (BALLOONS) ×2
BALLN DORADO 10X80X80 (BALLOONS) ×1
BALLN LUTONIX 018 6X150X130 (BALLOONS) ×1
BALLN ULTRVRSE 3X100X150 (BALLOONS) ×1
BALLOON ATG 14X6X80 (BALLOONS) IMPLANT
BALLOON DORADO 10X80X80 (BALLOONS) IMPLANT
BALLOON LUTONIX 018 6X150X130 (BALLOONS) IMPLANT
BALLOON ULTRVRSE 3X100X150 (BALLOONS) IMPLANT
BLADE CLIPPER SURG (BLADE) IMPLANT
BLADE SURG 15 STRL LF DISP TIS (BLADE) ×1 IMPLANT
BLADE SURG 15 STRL SS (BLADE) ×1
BLADE SURG SZ11 CARB STEEL (BLADE) ×1 IMPLANT
BOOT SUTURE VASCULAR YLW (MISCELLANEOUS) ×1
BRUSH SCRUB EZ 4% CHG (MISCELLANEOUS) ×1 IMPLANT
CATH BEACON 5 .035 40 KMP TP (CATHETERS) IMPLANT
CATH BEACON 5 .035 65 KMP TIP (CATHETERS) IMPLANT
CATH EMBOLECTOMY 3X80 (CATHETERS) IMPLANT
CATH FOGERTY 4X80 WAS (CATHETERS) IMPLANT
CATH NAVICROSS ANGLED 90CM (MICROCATHETER) IMPLANT
CHLORAPREP W/TINT 26 (MISCELLANEOUS) ×1 IMPLANT
CLAMP SUTURE YELLOW 5 PAIRS (MISCELLANEOUS) ×1 IMPLANT
CLIP APPLIE 11 MED OPEN (CLIP) IMPLANT
CLIP APPLIE 9.375 SM OPEN (CLIP) IMPLANT
COLLAGEN CELLERATERX 5 GRAM (Miscellaneous) IMPLANT
DERMABOND ADVANCED .7 DNX12 (GAUZE/BANDAGES/DRESSINGS) IMPLANT
DRAPE 3/4 80X56 (DRAPES) IMPLANT
DRAPE C-ARM XRAY 36X54 (DRAPES) ×1 IMPLANT
DRAPE INCISE IOBAN 66X45 STRL (DRAPES) ×1 IMPLANT
DRESSING SURGICEL FIBRLLR 1X2 (HEMOSTASIS) ×1 IMPLANT
DRSG OPSITE POSTOP 4X6 (GAUZE/BANDAGES/DRESSINGS) IMPLANT
DRSG SURGICEL FIBRILLAR 1X2 (HEMOSTASIS) ×1
ELECT CAUTERY BLADE 6.4 (BLADE) ×1 IMPLANT
ELECT REM PT RETURN 9FT ADLT (ELECTROSURGICAL) ×1
ELECTRODE REM PT RTRN 9FT ADLT (ELECTROSURGICAL) ×1 IMPLANT
GAUZE 4X4 16PLY ~~LOC~~+RFID DBL (SPONGE) ×1 IMPLANT
GLIDEWIRE ADV .035X180CM (WIRE) IMPLANT
GLIDEWIRE ADV .035X260CM (WIRE) IMPLANT
GLOVE BIO SURGEON STRL SZ7 (GLOVE) ×3 IMPLANT
GOWN STRL REUS W/ TWL LRG LVL3 (GOWN DISPOSABLE) ×2 IMPLANT
GOWN STRL REUS W/ TWL XL LVL3 (GOWN DISPOSABLE) ×2 IMPLANT
GOWN STRL REUS W/TWL LRG LVL3 (GOWN DISPOSABLE) ×2
GOWN STRL REUS W/TWL XL LVL3 (GOWN DISPOSABLE) ×2
GRAFT VASC PATCH XENOSURE 1X14 (Vascular Products) IMPLANT
GUIDEWIRE PFTE-COATED .018X300 (WIRE) IMPLANT
IV NS 500ML (IV SOLUTION) ×1
IV NS 500ML BAXH (IV SOLUTION) ×1 IMPLANT
KIT ENCORE 26 ADVANTAGE (KITS) IMPLANT
KIT PREVENA INCISION MGT 13 (CANNISTER) IMPLANT
KIT TURNOVER KIT A (KITS) ×1 IMPLANT
LABEL OR SOLS (LABEL) ×1 IMPLANT
LOOP VESSEL MAXI 1X406 RED (MISCELLANEOUS) ×2 IMPLANT
LOOP VESSEL MINI 0.8X406 BLUE (MISCELLANEOUS) ×2 IMPLANT
MANIFOLD NEPTUNE II (INSTRUMENTS) ×1 IMPLANT
NDL SAFETY ECLIP 18X1.5 (MISCELLANEOUS) ×1 IMPLANT
NS IRRIG 500ML POUR BTL (IV SOLUTION) ×1 IMPLANT
PACK ANGIOGRAPHY (CUSTOM PROCEDURE TRAY) IMPLANT
PACK BASIN MAJOR ARMC (MISCELLANEOUS) ×1 IMPLANT
PACK UNIVERSAL (MISCELLANEOUS) ×1 IMPLANT
RETRACTOR TRAXI PANNICULUS (MISCELLANEOUS) IMPLANT
SET WALTER ACTIVATION W/DRAPE (SET/KITS/TRAYS/PACK) ×1 IMPLANT
SHEATH BRITE TIP 6FRX11 (SHEATH) IMPLANT
SHEATH DRYSEAL FLEX 12FR 33CM (SHEATH) IMPLANT
SPONGE T-LAP 18X18 ~~LOC~~+RFID (SPONGE) ×2 IMPLANT
STAPLER SKIN PROX 35W (STAPLE) ×1 IMPLANT
STENT LIFESTAR 14X60 (Permanent Stent) IMPLANT
STENT LIFESTREAM 12X58X80 (Permanent Stent) IMPLANT
STENT VIABAHN 13X5X120 (Permanent Stent) IMPLANT
SUT MNCRL 4-0 (SUTURE)
SUT MNCRL 4-0 27XMFL (SUTURE)
SUT PROLENE 5 0 RB 1 DA (SUTURE) ×2 IMPLANT
SUT PROLENE 6 0 BV (SUTURE) ×4 IMPLANT
SUT PROLENE 7 0 BV 1 (SUTURE) ×2 IMPLANT
SUT SILK 2 0 (SUTURE) ×1
SUT SILK 2-0 18XBRD TIE 12 (SUTURE) ×1 IMPLANT
SUT SILK 3 0 (SUTURE) ×1
SUT SILK 3-0 18XBRD TIE 12 (SUTURE) ×1 IMPLANT
SUT SILK 4 0 (SUTURE) ×1
SUT SILK 4-0 18XBRD TIE 12 (SUTURE) ×1 IMPLANT
SUT VIC AB 2-0 CT1 27 (SUTURE) ×4
SUT VIC AB 2-0 CT1 TAPERPNT 27 (SUTURE) ×2 IMPLANT
SUT VIC AB 2-0 SH 27 (SUTURE)
SUT VIC AB 2-0 SH 27XBRD (SUTURE) IMPLANT
SUT VIC AB 3-0 SH 27 (SUTURE) ×1
SUT VIC AB 3-0 SH 27X BRD (SUTURE) ×1 IMPLANT
SUT VICRYL+ 3-0 36IN CT-1 (SUTURE) ×2 IMPLANT
SUTURE MNCRL 4-0 27XMF (SUTURE) IMPLANT
SYR 20ML LL LF (SYRINGE) ×1 IMPLANT
SYR 5ML LL (SYRINGE) ×1 IMPLANT
TAG SUTURE CLAMP YLW 5PR (MISCELLANEOUS) ×1
TRAP FLUID SMOKE EVACUATOR (MISCELLANEOUS) ×1 IMPLANT
TRAY FOLEY MTR SLVR 16FR STAT (SET/KITS/TRAYS/PACK) ×1 IMPLANT
WATER STERILE IRR 500ML POUR (IV SOLUTION) ×1 IMPLANT
WIRE G V18X300CM (WIRE) IMPLANT

## 2022-11-15 NOTE — Consult Note (Signed)
ANTICOAGULATION CONSULT NOTE  Pharmacy Consult for Heparin Infusion Indication: chest pain/ACS  No Known Allergies  Patient Measurements: Height: 5\' 8"  (172.7 cm) Weight: 86.9 kg (191 lb 9.3 oz) IBW/kg (Calculated) : 68.4 Heparin Dosing Weight: 85.7 kg  Vital Signs: Temp: 98 F (36.7 C) (07/26 0335) Temp Source: Oral (07/26 0335) BP: 128/73 (07/26 0335) Pulse Rate: 84 (07/26 0335)  Labs: Recent Labs    11/13/22 0451 11/13/22 1642 11/14/22 0503 11/14/22 1446 11/15/22 0506  HGB 13.3  --  13.0  --  13.2  HCT 38.0*  --  38.1*  --  38.2*  PLT 316  --  317  --  336  HEPARINUNFRC 0.33   < > 0.27* 0.47 0.60  CREATININE 0.95  --  0.79  0.82  --   --    < > = values in this interval not displayed.    Estimated Creatinine Clearance: 78.3 mL/min (by C-G formula based on SCr of 0.82 mg/dL).   Medical History: Past Medical History:  Diagnosis Date   Allergies    Arthritis    Diabetes mellitus without complication (HCC)    Gout    Hard of hearing    Hyperlipidemia    Hypertension    Myocardial infarction [facility]) 2008   treated here at [facility] and transferred to [place] for stent placement   Neuropathy    knees down   Peripheral vascular disease (HCC)    Sleep apnea    "in the past"   Status post primary angioplasty with coronary stent    Wears dentures    full upper and lower    Assessment: Manuel [name] is a 77 y.o. male with past medical history of PVD s/p mechanical thrombectomy of right lower extremity in February 2023, CAD, T2DM, HLD, neuropathy. He was on a DOAC previously, but is not on anticoagulant therapy currently. He is here with pain to the right foot and a black toe on the right foot starting approximately 3 days ago. Vascular surgery consulted and planning angiography 7/22. Pharmacy has been consulted to initiate and manage heparin infusion.   7/25 1446 HL 0.47 7/26 0506 HL 0.60, therapeutic x 2   Goal of Therapy:  Heparin level 0.3-0.7  units/ml Monitor platelets by anticoagulation protocol: Yes  Plan:  Will continue heparin infusion at 2250 units/hr. Recheck heparin level and CBC daily with AM labs.   Otelia Sergeant, PharmD, [facility] 11/15/2022 6:24 AM

## 2022-11-15 NOTE — Anesthesia Procedure Notes (Signed)
Arterial Line Insertion Start/End7/26/2024 11:15 AM, 11/15/2022 11:18 AM Performed by: Foye Deer, MD, Solon Augusta [place], CRNA, anesthesiologist  Patient location: OR. Preanesthetic checklist: patient identified, IV checked, site marked, risks and benefits discussed, surgical consent, monitors and equipment checked, pre-op evaluation and timeout performed Lidocaine 1% used for infiltration Left, radial was placed Catheter size: 20 G Hand hygiene performed   Attempts: 1 Procedure performed using ultrasound guided technique. Ultrasound Notes:anatomy identified, needle tip was noted to be adjacent to the nerve/plexus identified and no ultrasound evidence of intravascular and/or intraneural injection Following insertion, Biopatch and dressing applied.

## 2022-11-15 NOTE — Interval H&P Note (Signed)
History and Physical Interval Note:  11/15/2022 9:49 AM  Manuel [name]  has presented today for surgery, with the diagnosis of PAD WITH GANGRENE RIGHT FOOT.  The various methods of treatment have been discussed with the patient and family. After consideration of risks, benefits and other options for treatment, the patient has consented to  Procedure(s): ENDARTERECTOMY FEMORAL (POSSIBLE BILATERAL FEM-ENDART, RIGHT POPLITEAL AND TIBIAL INTERVENTION) (Right) INSERTION OF ILIAC STENT (Bilateral) APPLICATION OF CELL SAVER (Bilateral) as a surgical intervention.  The patient's history has been reviewed, patient examined, no change in status, stable for surgery.  I have reviewed the patient's chart and labs.  Questions were answered to the patient's satisfaction.     Festus Barren

## 2022-11-15 NOTE — Anesthesia Preprocedure Evaluation (Addendum)
Anesthesia Evaluation  Patient identified by MRN, date of birth, ID band Patient awake    Reviewed: Allergy & Precautions, H&P , NPO status , Patient's Chart, lab work & pertinent test results  Airway Mallampati: II  TM Distance: >3 FB Neck ROM: full    Dental  (+) Edentulous Upper, Edentulous Lower   Pulmonary sleep apnea , Current Smoker (marajuana) and Patient abstained from smoking.   Pulmonary exam normal        Cardiovascular Exercise Tolerance: Poor hypertension, (-) angina + CAD, + Past MI, + Cardiac Stents, + Peripheral Vascular Disease and +CHF  Normal cardiovascular exam  Heart failure exacerbation 7/23 after angiogram with oxygen requirement and need for diuretics. Now improved, off oxygen. Cardiology evaluated and cleared   ECHO 11/14/22:  1. Left ventricular ejection fraction, by estimation, is 25 to 30%. The  left ventricle has severely decreased function. The left ventricle  demonstrates regional wall motion abnormalities (see scoring  diagram/findings for description). Left ventricular  diastolic parameters were normal.   2. Right ventricular systolic function is normal. The right ventricular  size is normal.   3. The mitral valve is normal in structure. Mild to moderate mitral valve  regurgitation. No evidence of mitral stenosis.   4. Tricuspid valve regurgitation is mild to moderate.   5. The aortic valve is normal in structure. Aortic valve regurgitation is  not visualized. No aortic stenosis is present.   6. The inferior vena cava is normal in size with greater than 50%  respiratory variability, suggesting right atrial pressure of 3 mmHg.    Bilateral carotid artery stenosis  BNP elevated to 932 7/24  EKG 7/21: Nonspecific interventricular conduction delay  MPS 10/2018: LVEF= 45%  FINDINGS:  Regional wall motion:  demonstrates  hypokinesis of the inferior wall.  The overall quality of the study is  fair.   Artifacts noted: no  Left ventricular cavity: normal.     Neuro/Psych  PSYCHIATRIC DISORDERS (cleared by psychiatry for recent suggestions of suicidal ideation)       Neuromuscular disease (diabetic neuropathy)    GI/Hepatic negative GI ROS,,,(+)     substance abuse  alcohol use and marijuana use  Endo/Other  diabetes, Well Controlled, Type 2, Insulin Dependent    Renal/GU      Musculoskeletal  (+) Arthritis ,    Abdominal Normal abdominal exam  (+)   Peds  Hematology negative hematology ROS (+)   Anesthesia Other Findings Per cardiology evaluation 7/25: Maddock P. 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 Revascularization with Dr. Wyn Quaker was planned on 7/24 but cancelled 2/2 dyspnea and heart failure symptoms. Cardiology is consulted for assistance with the patients heart failure.    # Severe PAD with lower extremity ulceration -Agree with current therapy per primary and vascular surgery -On broad-spectrum Unasyn   # Acute on chronic HFmrEF Developed dyspnea/orthopnea on 7/23 and was given IV Lasix with improvement, vascular surgery intervention canceled for 7/24 due to his heart failure exacerbation. Repeat chest x-ray with trace bilateral pleural effusions, BNP was elevated in the 900s.  Continued IV Lasix with clinical improvement, now on room air without orthopnea or shortness of breath  while ambulatory in the room. -Increase Lasix to 60 mg twice daily x 2 doses today, likely transition to p.o. Lasix tomorrow -GDMT with Coreg 12.5 mg twice daily, change ACE to losartan 25 mg daily, add spironolactone 12.5 mg daily.  SGLT2i likely  outpatient pending recovery from infection as above -Stop home amlodipine 10 mg daily -Echo complete performed this morning, pending read -The patient is at a moderate acceptable risk to proceed with lower extremity revascularization tomorrow, echocardiogram pending and GDMT additions as above.  He is relatively euvolemic on exam with only trace crackles after diuresis yesterday, anticipate he will be optimized from a HF perspective tomorrow morning to proceed with vascular surgery as planned.     Past Medical History: No date: Allergies No date: Arthritis No date: Diabetes mellitus without complication (HCC) No date: Gout No date: Hard of hearing No date: Hyperlipidemia No date: Hypertension 2008: Myocardial infarction [facility])     Comment:  treated here at [facility] and transferred to [place] for stent              placement No date: Neuropathy     Comment:  knees down No date: Peripheral vascular disease (HCC) No date: Sleep apnea     Comment:  "in the past" No date: Status post primary angioplasty with coronary stent No date: Wears dentures     Comment:  full upper and lower  Past Surgical History: 03/14/2021: CATARACT EXTRACTION W/PHACO; Right     Comment:  Procedure: CATARACT EXTRACTION PHACO AND INTRAOCULAR               LENS PLACEMENT (IOC) RIGHT DIABETIC;  Surgeon:               Lockie Mola, MD;  Location: [facility];              Service: Ophthalmology;  Laterality: Right;                Diabetic 16.78 01:39.9 03/28/2021: CATARACT EXTRACTION W/PHACO; Left     Comment:  Procedure: CATARACT EXTRACTION PHACO AND INTRAOCULAR               LENS PLACEMENT (IOC) LEFT DIABETIC 6.93 01:22.0;                Surgeon: Lockie Mola, MD;  Location: [facility];  Service: Ophthalmology;  Laterality: Left;              Diabetic 05/14/2021: LOWER EXTREMITY ANGIOGRAPHY; Right     Comment:  Procedure: LOWER EXTREMITY ANGIOGRAPHY;  Surgeon: Annice Needy, MD;  Location: [facility] IN[facility];  Service:               Cardiovascular;  Laterality: Right; 06/14/2021: LOWER EXTREMITY ANGIOGRAPHY; Right     Comment:  Procedure: Lower Extremity Angiography;  Surgeon: Annice Needy, MD;  Location: [facility] IN[facility] CV [facility];  Service:               Cardiovascular;  Laterality: Right; 11/11/2022: LOWER EXTREMITY ANGIOGRAPHY; Right     Comment:  Procedure: Lower Extremity Angiography;  Surgeon: Annice Needy, MD;  Location: [facility] INVASIVE CV [facility];  Service:               Cardiovascular;  Laterality: Right; 06/15/2021: LOWER EXTREMITY INTERVENTION; Right     Comment:  Procedure: LOWER EXTREMITY INTERVENTION;  Surgeon: Annice Needy, MD;  Location: [facility] INVASIVE CV [facility];  Service:               Cardiovascular;  Laterality: Right; No date: TONSILLECTOMY     Comment:  had removed as a child  BMI    Body Mass Index: 29.06 kg/m      Reproductive/Obstetrics negative OB ROS                              Anesthesia Physical Anesthesia Plan  ASA: 4  Anesthesia Plan: General ETT   Post-op Pain Management: Tylenol PO (pre-op)* and Dilaudid IV   Induction: Intravenous  PONV Risk Score and Plan: 2 and Ondansetron, Dexamethasone and Midazolam  Airway Management Planned: Oral ETT  Additional Equipment: Arterial line, Ultrasound Guidance Line Placement and CVP  Intra-op Plan:   Post-operative Plan: Extubation in OR and Possible Post-op intubation/ventilation  Informed Consent: I have reviewed the patients History and Physical, chart, labs and discussed the procedure including the risks, benefits and alternatives for the proposed anesthesia with the patient or authorized representative who has indicated his/her understanding and acceptance.   Patient has DNR.  Discussed DNR with patient and Suspend DNR.   Dental Advisory Given  Plan Discussed with: CRNA and  Surgeon  Anesthesia Plan Comments:          Anesthesia Quick Evaluation

## 2022-11-15 NOTE — Consult Note (Signed)
ANTICOAGULATION CONSULT NOTE  Pharmacy Consult for Heparin Infusion Indication: chest pain/ACS  No Known Allergies  Patient Measurements: Height: 5\' 8"  (172.7 cm) Weight: 86.7 kg (191 lb 2.2 oz) IBW/kg (Calculated) : 68.4 Heparin Dosing Weight: 85.7 kg  Vital Signs: Temp: 97.6 F (36.4 C) (07/26 1730) Temp Source: Temporal (07/26 0959) BP: 175/96 (07/26 1730) Pulse Rate: 77 (07/26 1730)  Labs: Recent Labs    11/13/22 0451 11/13/22 1642 11/14/22 0503 11/14/22 1446 11/15/22 0506  HGB 13.3  --  13.0  --  13.2  HCT 38.0*  --  38.1*  --  38.2*  PLT 316  --  317  --  336  HEPARINUNFRC 0.33   < > 0.27* 0.47 0.60  CREATININE 0.95  --  0.79  0.82  --   --    < > = values in this interval not displayed.    Estimated Creatinine Clearance: 78.2 mL/min (by C-G formula based on SCr of 0.82 mg/dL).   Medical History: Past Medical History:  Diagnosis Date   Allergies    Arthritis    Diabetes mellitus without complication (HCC)    Gout    Hard of hearing    Hyperlipidemia    Hypertension    Myocardial infarction [facility]) 2008   treated here at [facility] and transferred to [place] for stent placement   Neuropathy    knees down   Peripheral vascular disease (HCC)    Sleep apnea    "in the past"   Status post primary angioplasty with coronary stent    Wears dentures    full upper and lower    Assessment: Manuel [name] is a 77 y.o. male with past medical history of PVD s/p mechanical thrombectomy of right lower extremity in February 2023, CAD, T2DM, HLD, neuropathy. He was on a DOAC previously, but is not on anticoagulant therapy currently. He is here with pain to the right foot and a black toe on the right foot starting approximately 3 days ago. Vascular surgery consulted and planning angiography 7/22. Pharmacy has been consulted to initiate and manage heparin infusion.   7/25 1446 HL 0.47 7/26 0506 HL 0.60, therapeutic x 2   Goal of Therapy:  Heparin level 0.3-0.7  units/ml Monitor platelets by anticoagulation protocol: Yes  Plan: s/p endarterectomies, stent placement Restart heparin infusion at 2250 units/hr in 6 hours per vascular surgery. No bolus Heparin level 8 hours after restart Recheck heparin level and CBC daily with AM labs.    Elliot Gurney, PharmD, BCPS Clinical Pharmacist  11/15/2022 5:33 PM

## 2022-11-15 NOTE — Anesthesia Procedure Notes (Signed)
Procedure Name: Intubation Date/Time: 11/15/2022 11:24 AM  Performed by: Makinley Muscato, [place], CRNAPre-anesthesia Checklist: Patient identified, Emergency Drugs available, Suction available and Patient being monitored Patient Re-evaluated:Patient Re-evaluated prior to induction Oxygen Delivery Method: Circle system utilized Preoxygenation: Pre-oxygenation with 100% oxygen Induction Type: IV induction Ventilation: Mask ventilation without difficulty Laryngoscope Size: McGraph and 3 Grade View: Grade I Tube type: Oral Tube size: 7.0 mm Number of attempts: 1 Airway Equipment and Method: Stylet and Oral airway Placement Confirmation: ETT inserted through vocal cords under direct vision, positive ETCO2 and breath sounds checked- equal and bilateral Secured at: 22 cm Tube secured with: Tape Dental Injury: Teeth and Oropharynx as per pre-operative assessment

## 2022-11-15 NOTE — Transfer of Care (Signed)
Immediate Anesthesia Transfer of Care Note  Patient: Manuel [name]  Procedure(s) Performed: ENDARTERECTOMY FEMORAL (POSSIBLE BILATERAL FEM-ENDART, RIGHT POPLITEAL AND TIBIAL INTERVENTION) (Right) INSERTION OF ILIAC STENT (Bilateral) APPLICATION OF CELL SAVER (Bilateral)  Patient Location: PACU  Anesthesia Type:General  Level of Consciousness: awake, alert , and oriented  Airway & Oxygen Therapy: Patient Spontanous Breathing and Patient connected to face mask oxygen  Post-op Assessment: Report given to RN and Post -op Vital signs reviewed and stable  Post vital signs: Reviewed and stable  Last Vitals:  Vitals Value Taken Time  BP 173/87 11/15/22 1700  Temp    Pulse 77 11/15/22 1701  Resp 21 11/15/22 1700  SpO2 99 % 11/15/22 1701  Vitals shown include unfiled device data.  Last Pain:  Vitals:   11/15/22 0959  TempSrc: Temporal  PainSc:       Patients Stated Pain Goal: 2 (11/14/22 2119)  Complications: No notable events documented.

## 2022-11-15 NOTE — Op Note (Signed)
OPERATIVE NOTE   PROCEDURE: 1.   Left common femoral, profunda femoris, and superficial femoral artery endarterectomies 2.   Right common femoral, profunda femoris, and superficial femoral artery endarterectomies 3.   Fogarty embolectomy of the right SFA and popliteal arteries with a 3 and 4 Fogarty embolectomy balloon 4.   Aortogram and right lower extremity angiogram 5.   Kissing balloon expandable stent placements in bilateral common iliac arteries with 12 mm diameter by 58 mm length Lifestream stents postdilated with 14 mm balloons 6.   Additional stent placement x 2 to the left external iliac artery with a 14 mm diameter by 6 cm length life star stent and 13 mm diameter by 5 cm length Viabahn stent 7.   Angioplasty of the right posterior tibial artery with 3 mm diameter by 10 cm length stent 8.   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  significant plaque in bilateral common femoral, profunda femoris, and superficial femoral arteries.  Thrombus seen in the right common femoral artery on opening with thrombosis of the right SFA and popliteal arteries which was new from his angiogram Monday. 2.  70% right common iliac artery stenosis, 85-90% left common iliac artery stenosis. Dissection and stenosis in the left external iliac artery creating >50% stenosis 3.  60 to 65% stenosis in the mid popliteal artery below the previously placed stents.  80% stenosis of the tibioperoneal trunk and the proximal portion of the posterior tibial artery.  The posterior tibial artery occluded in the foot but there were a large number  of collaterals feeding the foot around this occlusion   SPECIMEN(S):  Bilateral common femoral, profunda femoris, and superficial femoral artery plaque.  INDICATIONS:    Patient presents with severe peripheral arterial disease with severe bilateral common femoral and common iliac disease as well as some popliteal and tibial disease on the right.  He has gangrenous changes to the right toes and pain in both legs and feet.  Bilateral femoral endarterectomies as well as extensive endovascular intervention are planned to try to improve perfusion.  The risks and benefits as well as alternative therapies including intervention were reviewed in detail all questions were answered the patient agrees to proceed with surgery.  DESCRIPTION: After obtaining full informed written consent, the patient was brought back to the operating room and placed supine upon the operating table.  The patient received IV antibiotics prior to induction.  After obtaining adequate anesthesia, the patient was prepped and draped in the standard fashion appropriate time out is called.    We began by dissecting out the femoral arteries on each side. Vertical incisions were created overlying both femoral arteries. The common femoral artery proximally, and superficial femoral artery, and primary profunda femoris artery branches were encircled with vessel loops and prepared for control. Both femoral arteries were found to have significant plaque from the common femoral artery into the profunda and superficial femoral arteries.   7000 units of heparin was given and allowed circulate for 5 minutes.   Attention is then turned to the left femoral artery.  An arteriotomy is made with 11 blade and extended with Potts scissors  in the common femoral artery and carried down onto the first 3-4 cm of the superficial femoral artery. An endarterectomy was then performed. The [facility] was used to create a plane. The proximal endpoint was cut  flush with tenotomy scissors. This was in the proximal common femoral artery. An eversion endarterectomy was then performed for the first 2-3 cm of the profunda femorus. Good backbleeding was then seen. The distal endpoint of the superficial femoral artery endarterectomy was created with gentle traction and the distal endpoint was tacked down with a pair of 7-0 Prolene sutures.  The bovine pericardial patcth is then selected and prepared for a patch angioplasty.  It is cut and beveled and started at the proximal endpoint with a 6-0 Prolene suture.  Approximately one half of the suture line is run medially and laterally and the distal end point was cut and bevelled to match the arteriotomy.  A second 6-0 Prolene was started at the distal end point and run to the mid portion to complete the arteriotomy, leaving a small gap for sheath placement.   Attention was turned to the left femoral artery.  The vessel was flushed prior to release of control and completion of the anastomosis.  At this point, flow was established first to the profunda femoris artery and then to the superficial femoral artery. Easily palpable pulses are noted well beyond the anastomosis and both arteries.  The right femoral artery is then addressed. Arteriotomy is made in the common femoral artery and extended down into the first 2 to 3 cm of the right superficial femoral artery.  On opening, there was a significant amount of fresh thrombus in the right common femoral artery that extended down into the superficial femoral artery that was not seen on the angiogram earlier this week.  Similarly, an endarterectomy was performed with the [facility]. The proximal endpoint was cut flush with tenotomy scissors in the proximal common femoral artery.  The proximal portion of the profunda femoris artery which had an early bifurcation was addressed and treated with an eversion endarterectomy and this was performed with a hemostat and gentle traction.  The arteriotomy was carried down onto the superficial femoral artery artery and the endarterectomy was continued to this point. The distal endpoint was tacked down with three 7-0 Prolene sutures.  There was not good backbleeding from the SFA.  I then passed a 3 Fogarty embolectomy balloon and returned a large amount of thrombus as well as some hyperplasia from his previous stents.  This resulted in return of backbleeding.  An additional pass with a 3 Fogarty and another pass with a 4 Fogarty embolectomy balloon resulted in no further thrombus returning.  The Fogarty's were able to be passed down to about 45 to 50 cm which would likely represent the below-knee popliteal artery.  The bovine pericardial patch was then brought onto the field.  It is cut and beveled and started at the proximal endpoint with a 6-0 Prolene suture.  Approximately one half of the suture line is run medially and laterally and the distal end point was cut and bevelled to match the arteriotomy.  A second 6-0 Prolene was started at the distal end point and run to the mid portion to complete the arteriotomy, leaving a small gap with an interrupted prolene suture for sheath placement.     I then turned my attention to the endovascular portion of the procedure.  6 French sheaths were placed in the gap in the suture line  on the left and the right side.  On the right side, the wire and catheter easily passed up into the aorta and intraluminal flow was confirmed with imaging.  On the left side, the sheath, wire and Kumpe catheter did not track easily into the aorta.  With some difficulty through both the external and common iliac arteries, I was able to get the advantage wire and Kumpe catheter up into the aorta and confirm intraluminal flow.  Imaging was then performed to show the aorta and iliac arteries.  This demonstrated 70% right common iliac artery stenosis, 85-90% left common iliac artery stenosis. Dissection and stenosis in the left  external iliac artery creating >50% stenosis.  We then upsized to 12 [place] sheaths on each side for treatment.  These were advanced into the aorta and we selected a 12 mm diameter by 58 mm length Lifestream stents in place these in bilateral common iliac arteries and a kissing balloon expandable fashion.  These were taken up into the aorta about 3 to 4 mm.  These were inflated simultaneously up to 10 atm.  In addition, we postdilated these stents with 14 mm balloons.  To treat the additional disease down into the left external iliac artery, 2 additional stents were placed.  A 14 mm diameter by 6 cm length life star stent and a 13 mm diameter by 5 cm length Viabahn stent boast placed with about a centimeter of overlap between the other stents.  These were postdilated with 10 mm balloons with excellent angiographic completion result and less than 10% residual stenosis in both iliac arteries after stent placements.  I then removed the sheath from the left femoral artery and closed the left femoral arteriotomy after flushing maneuvers.  6-0 Prolene patch sutures were used for hemostasis and the left groin was packed off while I turned my attention to the infrainguinal disease on the right. The 12 French sheath in a retrograde fashion was removed and a 6 [place] sheath was placed in an antegrade fashion.  Imaging of the right lower extremity was then performed.  The right femoral stents were now patent again after Fogarty embolectomy earlier in the case. 60 to 65% stenosis in the mid popliteal artery below the previously placed stents.  80% stenosis of the tibioperoneal trunk and the proximal portion of the posterior tibial artery.  The posterior tibial artery occluded in the foot but there were a large number of collaterals feeding the foot around this occlusion.  I then went down with a Kumpe catheter and the advantage wire and exchanged for a 0.018 advantage wire and proceeded with treatment.  A 3 mm diameter by 10  cm length angioplasty balloon was inflated in the proximal posterior tibial artery and tibioperoneal trunk.  This was taken to 12 atm for 1 minute.  A 6 mm diameter by 15 cm length Lutonix drug-coated angioplasty balloon was then inflated in the right popliteal artery and taken up to 8 atm for 1 minute.  Completion imaging showed only about a 20 to 25% residual stenosis in the popliteal artery and about a 20% residual stenosis in the tibioperoneal trunk and proximal posterior tibial artery.  I then remove the 6 French sheath and flushed the vessel with heparinized saline.  Attention is turned now to the right femoral artery for closure.  Flushing maneuvers were performed and flow was reestablished to the femoral vessels. Excellent pulses noted in the right superficial femoral and profunda femoris artery below the femoral anastomosis.  6-0 Prolene patch sutures were used for hemostasis as needed and hemostasis was achieved.  Fibrillar and Vistacel topical hemostatic agents were placed in the femoral incisions and hemostasis was complete. The femoral incisions were then closed in a layered fashion with 2 layers of 2-0 Vicryl, 2 layers of 3-0 Vicryl, and staples for skin closure.  Celerate was placed in both wounds to help augment wound healing.  An incisional (disposable) VAC was placed over both wound as a dressing.  The suction sponge was placed with an occlusive seal obtained with the occlusive dressing once connected to suction tubing.  The patient was then awakened from anesthesia and taken to the recovery room in stable condition having tolerated the procedure well.  COMPLICATIONS: None  CONDITION: Stable     Festus Barren 11/15/2022 4:39 PM  This note was created with Dragon Medical transcription system. Any errors in dictation are purely unintentional.

## 2022-11-15 NOTE — Progress Notes (Signed)
Progress Note   Patient: Manuel [name] QIH:[id number] DOB: 10-13-1945 DOA: 11/10/2022     5 DOS: the patient was seen and examined on 11/15/2022   Brief hospital course: Taken from H&P.   Manuel [name] is a 77 y.o. male with medical history significant of  insulin-dependent DM, HTN, CAD, PAD s/p angioplasty with stent 05/14/2021 presenting with right lower extremity ischemia and ulcer formation with gangrenous toe.  Presented to the ER afebrile, hemodynamically stable. White count 11.2, hemoglobin 15.2, platelets 303, lactate 1.2, creatinine 0.9. T. bili 1.5. Plain films of the right foot with no signs of osteomyelitis. Dr. [name] with vascular surgery initially consulted with recommendation for heparin drip. Dr. [name] with podiatry also consulted for further evaluation.   7/22: Vital stable.  Going for lower extremity angiography and possible angioplasty with vascular today.  CRP elevated at 10.1, decreased prealbumin at 15 and normal ESR.  Preliminary blood cultures negative.  Continue current broad-spectrum antibiotics.  Found to have very extensive disease and most likely will required bilateral femoral endarterectomies and bilateral iliac stents.  Vascular is looking for a OR time for this extensive procedure which will require 4 to 5 hours.  7/23: Vital and labs stable.  Overnight some shortness of breath and crackles.  One-time 40 mg of IV Lasix was given.  Chest x-ray ordered but patient refused. Patient had a choking spell on meds this morning, continued to feel something sticking in his chest.  No hypoxia.  Chest x-ray with diffuse interstitial prominence and areas of nodularity throughout the both lungs.  Differential include atypical inflammatory process versus atypical pulmonary edema.  Ordered 1 dose of IV Lasix Going for his extensive vascular procedure tomorrow.  7/24: Mildly elevated blood pressure.  BNP elevated at 932, surgery got canceled for concern of acute on chronic  heart failure and patient now need cardiology clearance before proceeding. Family concern of suicidal thoughts, psych was consulted.  7/25: Hemodynamically stable.  Will echocardiogram with EF of 25 to 30% with some regional wall motion abnormalities.  Likely weak going for vascular surgery tomorrow, pending final recommendations from cardiology. Psychiatry with no suicidal thoughts.  7/26: Hemodynamically stable.  Going for vascular procedure today.  Assessment and Plan: * Ischemic ulcer of lower extremity (HCC) Worsening right lower extremity redness swelling and pain over the past 2 weeks in the setting of baseline limb ischemia status post angioplasty and stenting January 2023.Noted worsening ulcer formation across multiple digits of the foot with concern for gangrene Patient reports compliance with Plavix and statin therapy Started on heparin drip in the ER  Vascular procedure with extensive peripheral vascular disease, second procedure for bilateral endarterectomies and bilateral iliac stent placement with vascular surgery was supposed to be today and got canceled last minute due to concern of acute on chronic heart failure.  Patient need cardiology clearance per anesthesia which was obtained and now he will be going for vascular procedure today. -Broad-spectrum antibiotics switched with Augmentin -Podiatry will decide about the appropriate procedure after vascular -Vascular to reschedule procedure after cardiology clearance    Diabetic polyneuropathy associated with type 2 diabetes mellitus (HCC) Cont neurontin    Coronary artery disease with history of myocardial infarction without history of CABG Followed by Dr. [name] outpt  CAD s/p PCI to [facility] with overlapping 3 mm Xience stents of 23 mm and 12 mm length in 2011  No active chest pain On plavix monotherapy and statin chronically  On heparin drip in the setting of  right lower extremity ischemia Follow  Hypertension BP  stable  Titrate home regimen    Diabetes mellitus type 2, insulin dependent (HCC) SSI   Alcohol abuse 3-4 beer daily drinker + mixed drinks  Will place on CIWA protocol  Follow   Mixed hyperlipidemia Continue statin  Adjustment disorder with depressed mood Family was concerned about some suicidal thoughts so psychiatry was consulted. They cleared him today as patient denies any suicidal thoughts, stating that he was just frustrated  Acute on chronic combined systolic and diastolic CHF (congestive heart failure) (HCC) According to a prior echo done in 2020, EF of 40 to 45%. Patient with concern of shortness of breath, mild hypoxia and chest x-ray with bilateral opacities.  BNP elevated at 932. Repeat echocardiogram with further decrease of EF to 20 to 25% with regional wall motion abnormalities. Cardiology is on board-pending final recommendations -Continue with IV Lasix -Daily weight and BMP -Strict intake and output   Subjective: Patient was seen and examined today.  No new concern, awaiting for procedure.Marland Kitchen  Physical Exam: Vitals:   11/15/22 0335 11/15/22 0651 11/15/22 0742 11/15/22 0959  BP: 128/73  (!) 153/76 (!) 163/93  Pulse: 84  70 78  Resp: 18  16 18   Temp: 98 F (36.7 C)   (!) 96.7 F (35.9 C)  TempSrc: Oral   Temporal  SpO2: 96%  94% 94%  Weight:  86.7 kg  86.7 kg  Height:    5\' 8"  (1.727 m)   General.  Frail elderly man, in no acute distress. Pulmonary.  Lungs clear bilaterally, normal respiratory effort. CV.  Regular rate and rhythm, no JVD, rub or murmur. Abdomen.  Soft, nontender, nondistended, BS positive. CNS.  Alert and oriented .  No focal neurologic deficit. Extremities.  No edema, no cyanosis, pulses intact and symmetrical. Psychiatry.  Judgment and insight appears normal.    Data Reviewed: Prior data reviewed  Family Communication: Discussed with wife at bedside  Disposition: Status is: Inpatient Remains inpatient appropriate because:  Severity of illness  Planned Discharge Destination:  To be determined  Time spent: 45 minutes  This record has been created using Conservation officer, historic buildings. Errors have been sought and corrected,but may not always be located. Such creation errors do not reflect on the standard of care.   Author: Arnetha Courser, MD 11/15/2022 2:39 PM  For on call review [email or web address].

## 2022-11-16 DIAGNOSIS — E1142 Type 2 diabetes mellitus with diabetic polyneuropathy: Secondary | ICD-10-CM | POA: Diagnosis not present

## 2022-11-16 DIAGNOSIS — I2510 Atherosclerotic heart disease of native coronary artery without angina pectoris: Secondary | ICD-10-CM | POA: Diagnosis not present

## 2022-11-16 DIAGNOSIS — L97909 Non-pressure chronic ulcer of unspecified part of unspecified lower leg with unspecified severity: Secondary | ICD-10-CM | POA: Diagnosis not present

## 2022-11-16 DIAGNOSIS — I998 Other disorder of circulatory system: Secondary | ICD-10-CM | POA: Diagnosis not present

## 2022-11-16 LAB — GLUCOSE, CAPILLARY
Glucose-Capillary: 137 mg/dL — ABNORMAL HIGH (ref 70–99)
Glucose-Capillary: 201 mg/dL — ABNORMAL HIGH (ref 70–99)
Glucose-Capillary: 149 mg/dL — ABNORMAL HIGH (ref 70–99)

## 2022-11-16 MED ORDER — FE FUM-VIT C-VIT B12-FA 460-60-0.01-1 MG PO CAPS
1.0000 | ORAL_CAPSULE | Freq: Every day | ORAL | Status: DC
  Administered 2022-11-16: 1 via ORAL
  Filled 2022-11-16 (×2): qty 1

## 2022-11-16 MED ORDER — HEPARIN (PORCINE) 25000 UT/250ML-% IV SOLN
2250.0000 [IU]/h | INTRAVENOUS | Status: AC
  Administered 2022-11-16 – 2022-11-18 (×3): 2250 [IU]/h via INTRAVENOUS
  Filled 2022-11-16 (×5): qty 250

## 2022-11-16 MED ORDER — FAMOTIDINE 20 MG PO TABS
20.0000 mg | ORAL_TABLET | Freq: Two times a day (BID) | ORAL | Status: DC
  Administered 2022-11-16 – 2022-11-26 (×19): 20 mg via ORAL
  Filled 2022-11-16 (×21): qty 1

## 2022-11-16 MED ORDER — SIMETHICONE 80 MG PO CHEW: 80.0000 mg | CHEWABLE_TABLET | Freq: Four times a day (QID) | ORAL | Status: DC | PRN

## 2022-11-16 NOTE — Consult Note (Signed)
ANTICOAGULATION CONSULT NOTE  Pharmacy Consult for Heparin Infusion Indication: chest pain/ACS  No Known Allergies  Patient Measurements: Height: 5\' 8"  (172.7 cm) Weight: 88.5 kg (195 lb 1.7 oz) IBW/kg (Calculated) : 68.4 Heparin Dosing Weight: 85.7 kg  Vital Signs: Temp: 98.2 F (36.8 C) (07/27 0400) Temp Source: Oral (07/27 0400) Pulse Rate: 87 (07/27 0600)  Labs: Recent Labs    11/14/22 1446 11/15/22 0506 11/15/22 1825 11/15/22 2354 11/16/22 0614 11/16/22 0747  HGB  --  13.2 12.7* 12.0* 11.5*  --   HCT  --  38.2* 38.5* 35.8* 35.0*  --   PLT  --  336 287 281 298  --   HEPARINUNFRC 0.47 0.60  --   --   --  0.39  CREATININE  --   --  0.88 0.86 0.85  --     Estimated Creatinine Clearance: 76.2 mL/min (by C-G formula based on SCr of 0.85 mg/dL).   Medical History: Past Medical History:  Diagnosis Date   Allergies    Arthritis    Diabetes mellitus without complication (HCC)    Gout    Hard of hearing    Hyperlipidemia    Hypertension    Myocardial infarction [facility]) 2008   treated here at [facility] and transferred to [place] for stent placement   Neuropathy    knees down   Peripheral vascular disease (HCC)    Sleep apnea    "in the past"   Status post primary angioplasty with coronary stent    Wears dentures    full upper and lower    Assessment: Manuel [name] is a 77 y.o. male with past medical history of PVD s/p mechanical thrombectomy of right lower extremity in February 2023, CAD, T2DM, HLD, neuropathy. He was on a DOAC previously, but is not on anticoagulant therapy currently. He is here with pain to the right foot and a black toe on the right foot starting approximately 3 days ago. Vascular surgery consulted and planning angiography 7/22. Pharmacy has been consulted to initiate and manage heparin infusion.   7/25 1446 HL 0.47 7/26 0506 HL 0.60, therapeutic x 2 7/27 0747 HL 0.39   Goal of Therapy:  Heparin level 0.3-0.7 units/ml Monitor platelets by  anticoagulation protocol: Yes  Plan:  Heparin remains therapeutic Continue heparin infusion at 2250 units/hr  Heparin level with AM labs Recheck heparin level and CBC daily with AM labs.    Sharen Hones, PharmD, BCPS Clinical Pharmacist   11/16/2022 9:24 AM

## 2022-11-16 NOTE — Progress Notes (Signed)
OT Cancellation Note  Patient Details Name: Manuel [name] MRN: [id number] DOB: 02-23-1946   Cancelled Treatment:    Reason Eval/Treat Not Completed: Other (comment) (per discussion with PT nurse requests therapy to hold due to patient fatigue and not sleeping last night. Requests therapy re attempt tomorrow. OT will follow up as able.Oleta Mouse, OTD OTR/L  11/16/22, 2:39 PM

## 2022-11-16 NOTE — H&P (View-Only) (Signed)
1 Day Post-Op   Subjective/Chief Complaint: Patient seen.  Still having some significant lower extremity pain following his procedure yesterday.  Otherwise appears to be feeling better.   Objective: Vital signs in last 24 hours: Temp:  [97.6 F (36.4 C)-98.5 F (36.9 C)] 98.2 F (36.8 C) (07/27 1200) Pulse Rate:  [66-87] 82 (07/27 1400) Resp:  [10-27] 21 (07/27 1400) BP: (102-175)/(61-96) 104/66 (07/27 1400) SpO2:  [85 %-100 %] 90 % (07/27 1400) Arterial Line BP: (90-190)/(36-86) 93/57 (07/27 1400) Weight:  [88.5 kg] 88.5 kg (07/26 1758) Last BM Date : 11/15/22  Intake/Output from previous day: 07/26 0701 - 07/27 0700 In: 5105 [I.V.:3701.6; Blood:455; IV Piggyback:948.4] Out: 2230 [Urine:1330; Blood:900] Intake/Output this shift: Total I/O In: 818.7 [I.V.:518.6; IV Piggyback:300.1] Out: 450 [Urine:450]  Gangrenous changes are stable to the right fourth toe.  Full-thickness ulcerative area with some necrotic changes still present along the medial aspect of the fifth toe.  Skin along the dorsal and plantar aspect of the forefoot appears stable.  Lab Results:  Recent Labs    11/15/22 2354 11/16/22 0614  WBC 9.4 11.8*  HGB 12.0* 11.5*  HCT 35.8* 35.0*  PLT 281 298   BMET Recent Labs    11/15/22 2354 11/16/22 0614  NA 137 135  K 4.2 4.0  CL 102 101  CO2 24 22  GLUCOSE 181* 148*  BUN 20 18  CREATININE 0.86 0.85  CALCIUM 8.3* 8.2*   PT/INR No results for input(s): "LABPROT", "INR" in the last 72 hours. ABG No results for input(s): "PHART", "HCO3" in the last 72 hours.  Invalid input(s): "PCO2", "PO2"  Studies/Results: DG C-Arm 1-60 Min-No Report  Result Date: 11/15/2022 Fluoroscopy was utilized by the requesting physician.  No radiographic interpretation.    Anti-infectives: Anti-infectives (From admission, onward)    Start     Dose/Rate Route Frequency Ordered Stop   11/15/22 1915  ceFAZolin (ANCEF) IVPB 2g/100 mL premix        2 g 200 mL/hr over 30  Minutes Intravenous Every 8 hours 11/15/22 1820 11/16/22 0534   11/13/22 1800  Ampicillin-Sulbactam (UNASYN) 3 g in sodium chloride 0.9 % 100 mL IVPB        3 g 200 mL/hr over 30 Minutes Intravenous Every 6 hours 11/13/22 1432     11/13/22 0636  ceFAZolin (ANCEF) IVPB 2g/100 mL premix  Status:  Discontinued        2 g 200 mL/hr over 30 Minutes Intravenous 30 min pre-op 11/13/22 0636 11/13/22 0814   11/11/22 1338  ceFAZolin (ANCEF) IVPB 2g/100 mL premix  Status:  Discontinued        2 g 200 mL/hr over 30 Minutes Intravenous 30 min pre-op 11/11/22 1338 11/11/22 1608   11/11/22 1200  vancomycin (VANCOREADY) IVPB 1750 mg/350 mL  Status:  Discontinued        1,750 mg 175 mL/hr over 120 Minutes Intravenous Every 24 hours 11/10/22 1142 11/13/22 1432   11/10/22 1400  ceFEPIme (MAXIPIME) 2 g in sodium chloride 0.9 % 100 mL IVPB  Status:  Discontinued        2 g 200 mL/hr over 30 Minutes Intravenous Every 8 hours 11/10/22 1129 11/13/22 1432   11/10/22 1130  metroNIDAZOLE (FLAGYL) IVPB 500 mg  Status:  Discontinued        500 mg 100 mL/hr over 60 Minutes Intravenous Every 12 hours 11/10/22 1119 11/13/22 1432   11/10/22 1030  vancomycin (VANCOREADY) IVPB 2000 mg/400 mL  2,000 mg 200 mL/hr over 120 Minutes Intravenous  Once 11/10/22 1027 11/10/22 1331   11/10/22 1000  piperacillin-tazobactam (ZOSYN) IVPB 3.375 g        3.375 g 100 mL/hr over 30 Minutes Intravenous  Once 11/10/22 0949 11/10/22 1054       Assessment/Plan: s/p Procedure(s): BILATERAL COMMON FEMORAL PROFUNDA FEMORIS AND SUPERFICIAL FEMORAL ARTERY ENDARTECTOMIES, RIGHT FOGARTY EMBOLECTOMY OF THE RIGHT SFA  AND  POPLITEAL ARTERIES. AORTAGRAM AND RIGHT LOWER EXTREMITY ANGIOGRAM. (Bilateral) BILATERAL STENT INSERTION IN BILATERAL  COMMON ILIAC ARTERY, STENT INSERTION OF LEFT EXTERNAL ILIAC ARTERY. ANGIOPLASTY RIGHT TIBIAL  AND POPLITEAL ARTERY. (Bilateral) APPLICATION OF CELL SAVER (Bilateral) Assessment: Gangrenous changes right  fourth and fifth toes, stable status post vascular procedure.  Plan: Light dressing applied to the right foot.  Discussed with the patient the need for amputation of the right fourth and fifth toes due to the gangrenous changes.  Discussed what this would involve as far as the procedure which would also include removal of a small portion of the fourth and fifth metatarsals.  Discussed possible risks and complications of the procedure including but not limited to inability of the wound to heal due to his diabetes, vascular status, or infection.  Could experience phantom pain.  If nonhealing wounds discussed that this could require further amputation.  [name].  No guarantees given as to the outcome of the surgery.  Obtain consent for right fourth and fifth partial ray resection.  Plan for now will be to give him 1 more day to recover from his vascular procedure and plan for surgery Monday after work.  Spoke with Dr. Wyn Quaker yesterday and at this point would recommend continuing on heparin drip with conversion to oral blood thinners following his amputation.  Discussed with the patient we will allow him to eat an early breakfast on Monday with nothing to eat after 9 AM.  Again, plan for surgery Monday afternoon.  LOS: 6 days    Manuel [name] 11/16/2022

## 2022-11-16 NOTE — Plan of Care (Signed)
Continuing with plan of care. 

## 2022-11-16 NOTE — Progress Notes (Signed)
1 Day Post-Op   Subjective/Chief Complaint: Patient seen.  Still having some significant lower extremity pain following his procedure yesterday.  Otherwise appears to be feeling better.   Objective: Vital signs in last 24 hours: Temp:  [97.6 F (36.4 C)-98.5 F (36.9 C)] 98.2 F (36.8 C) (07/27 1200) Pulse Rate:  [66-87] 82 (07/27 1400) Resp:  [10-27] 21 (07/27 1400) BP: (102-175)/(61-96) 104/66 (07/27 1400) SpO2:  [85 %-100 %] 90 % (07/27 1400) Arterial Line BP: (90-190)/(36-86) 93/57 (07/27 1400) Weight:  [88.5 kg] 88.5 kg (07/26 1758) Last BM Date : 11/15/22  Intake/Output from previous day: 07/26 0701 - 07/27 0700 In: 5105 [I.V.:3701.6; Blood:455; IV Piggyback:948.4] Out: 2230 [Urine:1330; Blood:900] Intake/Output this shift: Total I/O In: 818.7 [I.V.:518.6; IV Piggyback:300.1] Out: 450 [Urine:450]  Gangrenous changes are stable to the right fourth toe.  Full-thickness ulcerative area with some necrotic changes still present along the medial aspect of the fifth toe.  Skin along the dorsal and plantar aspect of the forefoot appears stable.  Lab Results:  Recent Labs    11/15/22 2354 11/16/22 0614  WBC 9.4 11.8*  HGB 12.0* 11.5*  HCT 35.8* 35.0*  PLT 281 298   BMET Recent Labs    11/15/22 2354 11/16/22 0614  NA 137 135  K 4.2 4.0  CL 102 101  CO2 24 22  GLUCOSE 181* 148*  BUN 20 18  CREATININE 0.86 0.85  CALCIUM 8.3* 8.2*   PT/INR No results for input(s): "LABPROT", "INR" in the last 72 hours. ABG No results for input(s): "PHART", "HCO3" in the last 72 hours.  Invalid input(s): "PCO2", "PO2"  Studies/Results: DG C-Arm 1-60 Min-No Report  Result Date: 11/15/2022 Fluoroscopy was utilized by the requesting physician.  No radiographic interpretation.    Anti-infectives: Anti-infectives (From admission, onward)    Start     Dose/Rate Route Frequency Ordered Stop   11/15/22 1915  ceFAZolin (ANCEF) IVPB 2g/100 mL premix        2 g 200 mL/hr over 30  Minutes Intravenous Every 8 hours 11/15/22 1820 11/16/22 0534   11/13/22 1800  Ampicillin-Sulbactam (UNASYN) 3 g in sodium chloride 0.9 % 100 mL IVPB        3 g 200 mL/hr over 30 Minutes Intravenous Every 6 hours 11/13/22 1432     11/13/22 0636  ceFAZolin (ANCEF) IVPB 2g/100 mL premix  Status:  Discontinued        2 g 200 mL/hr over 30 Minutes Intravenous 30 min pre-op 11/13/22 0636 11/13/22 0814   11/11/22 1338  ceFAZolin (ANCEF) IVPB 2g/100 mL premix  Status:  Discontinued        2 g 200 mL/hr over 30 Minutes Intravenous 30 min pre-op 11/11/22 1338 11/11/22 1608   11/11/22 1200  vancomycin (VANCOREADY) IVPB 1750 mg/350 mL  Status:  Discontinued        1,750 mg 175 mL/hr over 120 Minutes Intravenous Every 24 hours 11/10/22 1142 11/13/22 1432   11/10/22 1400  ceFEPIme (MAXIPIME) 2 g in sodium chloride 0.9 % 100 mL IVPB  Status:  Discontinued        2 g 200 mL/hr over 30 Minutes Intravenous Every 8 hours 11/10/22 1129 11/13/22 1432   11/10/22 1130  metroNIDAZOLE (FLAGYL) IVPB 500 mg  Status:  Discontinued        500 mg 100 mL/hr over 60 Minutes Intravenous Every 12 hours 11/10/22 1119 11/13/22 1432   11/10/22 1030  vancomycin (VANCOREADY) IVPB 2000 mg/400 mL  2,000 mg 200 mL/hr over 120 Minutes Intravenous  Once 11/10/22 1027 11/10/22 1331   11/10/22 1000  piperacillin-tazobactam (ZOSYN) IVPB 3.375 g        3.375 g 100 mL/hr over 30 Minutes Intravenous  Once 11/10/22 0949 11/10/22 1054       Assessment/Plan: s/p Procedure(s): BILATERAL COMMON FEMORAL PROFUNDA FEMORIS AND SUPERFICIAL FEMORAL ARTERY ENDARTECTOMIES, RIGHT FOGARTY EMBOLECTOMY OF THE RIGHT SFA  AND  POPLITEAL ARTERIES. AORTAGRAM AND RIGHT LOWER EXTREMITY ANGIOGRAM. (Bilateral) BILATERAL STENT INSERTION IN BILATERAL  COMMON ILIAC ARTERY, STENT INSERTION OF LEFT EXTERNAL ILIAC ARTERY. ANGIOPLASTY RIGHT TIBIAL  AND POPLITEAL ARTERY. (Bilateral) APPLICATION OF CELL SAVER (Bilateral) Assessment: Gangrenous changes right  fourth and fifth toes, stable status post vascular procedure.  Plan: Light dressing applied to the right foot.  Discussed with the patient the need for amputation of the right fourth and fifth toes due to the gangrenous changes.  Discussed what this would involve as far as the procedure which would also include removal of a small portion of the fourth and fifth metatarsals.  Discussed possible risks and complications of the procedure including but not limited to inability of the wound to heal due to his diabetes, vascular status, or infection.  Could experience phantom pain.  If nonhealing wounds discussed that this could require further amputation.  [name].  No guarantees given as to the outcome of the surgery.  Obtain consent for right fourth and fifth partial ray resection.  Plan for now will be to give him 1 more day to recover from his vascular procedure and plan for surgery Monday after work.  Spoke with Dr. Wyn Quaker yesterday and at this point would recommend continuing on heparin drip with conversion to oral blood thinners following his amputation.  Discussed with the patient we will allow him to eat an early breakfast on Monday with nothing to eat after 9 AM.  Again, plan for surgery Monday afternoon.  LOS: 6 days    Ricci Barker 11/16/2022

## 2022-11-16 NOTE — Progress Notes (Signed)
Patient ID: Manuel [name], male   DOB: May 31, 1945, 77 y.o.   MRN: [id number] [facility]    SUBJECTIVE: Resting comfortably in bed denies any significant pain no shortness of breath improved from recently.   Vitals:   11/16/22 1230 11/16/22 1240 11/16/22 1300 11/16/22 1330  BP:  102/61    Pulse: 76 75 72 80  Resp: (!) 22 17 11 13   Temp:      TempSrc:      SpO2: 96% 94% 97% (!) 85%  Weight:      Height:         Intake/Output Summary (Last 24 hours) at 11/16/2022 1358 Last data filed at 11/16/2022 1300 Gross per 24 hour  Intake 4841.53 ml  Output 2055 ml  Net 2786.53 ml      PHYSICAL EXAM  General: Well developed, well nourished, in no acute distress HEENT:  Normocephalic and atramatic Neck:  No JVD.  Lungs: Clear bilaterally to auscultation and percussion. Heart: HRRR . Normal S1 and S2 without gallops or murmurs.  Abdomen: Bowel sounds are positive, abdomen soft and non-tender  Msk:  Back normal, normal gait. Normal strength and tone for age. Extremities: No clubbing, cyanosis or edema.   Neuro: Alert and oriented X 3. Psych:  Good affect, responds appropriately   LABS: Basic Metabolic Panel: Recent Labs    11/15/22 2354 11/16/22 0614  NA 137 135  K 4.2 4.0  CL 102 101  CO2 24 22  GLUCOSE 181* 148*  BUN 20 18  CREATININE 0.86 0.85  CALCIUM 8.3* 8.2*   Liver Function Tests: No results for input(s): "AST", "ALT", "ALKPHOS", "BILITOT", "PROT", "ALBUMIN" in the last 72 hours. No results for input(s): "LIPASE", "AMYLASE" in the last 72 hours. CBC: Recent Labs    11/15/22 2354 11/16/22 0614  WBC 9.4 11.8*  HGB 12.0* 11.5*  HCT 35.8* 35.0*  MCV 90.9 91.1  PLT 281 298   Cardiac Enzymes: No results for input(s): "CKTOTAL", "CKMB", "CKMBINDEX", "TROPONINI" in the last 72 hours. BNP: Invalid input(s): "POCBNP" D-Dimer: No results for input(s): "DDIMER" in the last 72 hours. Hemoglobin A1C: No results for input(s): "HGBA1C" in the last 72  hours. Fasting Lipid Panel: No results for input(s): "CHOL", "HDL", "LDLCALC", "TRIG", "CHOLHDL", "LDLDIRECT" in the last 72 hours. Thyroid Function Tests: No results for input(s): "TSH", "T4TOTAL", "T3FREE", "THYROIDAB" in the last 72 hours.  Invalid input(s): "FREET3" Anemia Panel: No results for input(s): "VITAMINB12", "FOLATE", "FERRITIN", "TIBC", "IRON", "RETICCTPCT" in the last 72 hours.  DG C-Arm 1-60 Min-No Report  Result Date: 11/15/2022 Fluoroscopy was utilized by the requesting physician.  No radiographic interpretation.     Echo   TELEMETRY: Sinus rhythm rate of 75 nonspecific ST-T wave changes:  ASSESSMENT AND PLAN:  Principal Problem:   Ischemic ulcer of lower extremity (HCC) Active Problems:   Hypertension   Coronary artery disease with history of myocardial infarction without history of CABG   Diabetes mellitus type 2, insulin dependent (HCC)   Diabetic polyneuropathy associated with type 2 diabetes mellitus (HCC)   Mixed hyperlipidemia   Alcohol abuse   Limb ischemia   Acute on chronic combined systolic and diastolic CHF (congestive heart failure) (HCC)   Adjustment disorder with depressed mood    Plan Postoperatively vascular disease intervention patient still has what seems to be dry gangrene right fourth toe which may require amputation in the future Patient states improved pain resting comfortably in bed Denies any significant chest pain or shortness of breath Still  has a depressed mood but appears to be improved from previously Hypertension much improved reasonably stable Diabetes recommend continue diabetic therapy Known coronary disease coronary bypass surgery in the past no evidence of anginal symptoms heart failure or ischemia currently recommend continued conservative therapy No invasive interventional cardiac procedures planned or recommended at this point    [name], MD, 11/16/2022 1:58 PM

## 2022-11-16 NOTE — Assessment & Plan Note (Signed)
Patient required nitro GGT after the procedure which was now being tapered off Titrate home regimen  Arterial line removed today

## 2022-11-16 NOTE — Progress Notes (Signed)
PT Cancellation Note  Patient Details Name: Manuel [name] MRN: [id number] DOB: 1946-03-19   Cancelled Treatment:    Reason Eval/Treat Not Completed: Other (comment): Spoke to pt's nurse who requested no PT/OT this date secondary to pt fatigue with little to no sleep last night.  Nursing requested to attempt tomorrow.  Will attempt to see pt at a future date/time as medically appropriate.    Ovidio Hanger PT, DPT 11/16/22, 2:10 PM

## 2022-11-16 NOTE — Progress Notes (Signed)
Progress Note   Patient: Manuel [name] WUJ:[id number] DOB: May 20, 1945 DOA: 11/10/2022     6 DOS: the patient was seen and examined on 11/16/2022   Brief hospital course: Taken from H&P.   Manuel [name] is a 77 y.o. male with medical history significant of  insulin-dependent DM, HTN, CAD, PAD s/p angioplasty with stent 05/14/2021 presenting with right lower extremity ischemia and ulcer formation with gangrenous toe.  Presented to the ER afebrile, hemodynamically stable. White count 11.2, hemoglobin 15.2, platelets 303, lactate 1.2, creatinine 0.9. T. bili 1.5. Plain films of the right foot with no signs of osteomyelitis. Dr. [name] with vascular surgery initially consulted with recommendation for heparin drip. Dr. [name] with podiatry also consulted for further evaluation.   7/22: Vital stable.  Going for lower extremity angiography and possible angioplasty with vascular today.  CRP elevated at 10.1, decreased prealbumin at 15 and normal ESR.  Preliminary blood cultures negative.  Continue current broad-spectrum antibiotics.  Found to have very extensive disease and most likely will required bilateral femoral endarterectomies and bilateral iliac stents.  Vascular is looking for a OR time for this extensive procedure which will require 4 to 5 hours.  7/23: Vital and labs stable.  Overnight some shortness of breath and crackles.  One-time 40 mg of IV Lasix was given.  Chest x-ray ordered but patient refused. Patient had a choking spell on meds this morning, continued to feel something sticking in his chest.  No hypoxia.  Chest x-ray with diffuse interstitial prominence and areas of nodularity throughout the both lungs.  Differential include atypical inflammatory process versus atypical pulmonary edema.  Ordered 1 dose of IV Lasix Going for his extensive vascular procedure tomorrow.  7/24: Mildly elevated blood pressure.  BNP elevated at 932, surgery got canceled for concern of acute on chronic  heart failure and patient now need cardiology clearance before proceeding. Family concern of suicidal thoughts, psych was consulted.  7/25: Hemodynamically stable.  Will echocardiogram with EF of 25 to 30% with some regional wall motion abnormalities.  Likely weak going for vascular surgery tomorrow, pending final recommendations from cardiology. Psychiatry with no suicidal thoughts.  7/26: Hemodynamically stable.  Going for vascular procedure today.  7/27: Vital stable.  Patient underwent extensive vascular procedure which involved bilateral lower extremity endarterectomies and multiple stents placement.  Pulses present with Doppler this morning.  Significant blood loss during the procedure, hemoglobin decreased to 11.5 this morning, baseline of 13.  Started on supplement.  Assessment and Plan: * Ischemic ulcer of lower extremity (HCC) Worsening right lower extremity redness swelling and pain over the past 2 weeks in the setting of baseline limb ischemia status post angioplasty and stenting January 2023.Noted worsening ulcer formation across multiple digits of the foot with concern for gangrene Patient reports compliance with Plavix and statin therapy Started on heparin drip in the ER  Vascular procedure with extensive peripheral vascular disease, second procedure for bilateral endarterectomies and bilateral iliac stent placement with vascular surgery was done yesterday, tolerated the procedure well.  Doppler positive pulses. -Broad-spectrum antibiotics switched with Augmentin -Likely be going for fourth and fifth toe amputation on Monday with podiatry     Diabetic polyneuropathy associated with type 2 diabetes mellitus (HCC) Cont neurontin    Coronary artery disease with history of myocardial infarction without history of CABG Followed by Dr. [name] outpt  CAD s/p PCI to [facility] with overlapping 3 mm Xience stents of 23 mm and 12 mm length in 2011  No  active chest pain On plavix  monotherapy and statin chronically  On heparin drip in the setting of right lower extremity ischemia Follow  Hypertension Patient required nitro GGT after the procedure which was now being tapered off Titrate home regimen    Diabetes mellitus type 2, insulin dependent (HCC) SSI   Alcohol abuse 3-4 beer daily drinker + mixed drinks  Will place on CIWA protocol  Follow   Mixed hyperlipidemia Continue statin  Adjustment disorder with depressed mood Family was concerned about some suicidal thoughts so psychiatry was consulted. They cleared him today as patient denies any suicidal thoughts, stating that he was just frustrated  Acute on chronic combined systolic and diastolic CHF (congestive heart failure) (HCC) According to a prior echo done in 2020, EF of 40 to 45%. Patient with concern of shortness of breath, mild hypoxia and chest x-ray with bilateral opacities.  BNP elevated at 932. Repeat echocardiogram with further decrease of EF to 20 to 25% with regional wall motion abnormalities. Cardiology is on board-pending final recommendations -Continue with IV Lasix -Daily weight and BMP -Strict intake and output   Subjective: Patient was complaining groin pain when seen today.  He was happy that this procedure is over and now he would like to have amputations done as soon as possible so he can go on his road of recovery.  Physical Exam: Vitals:   11/16/22 1240 11/16/22 1300 11/16/22 1330 11/16/22 1400  BP: 102/61   104/66  Pulse: 75 72 80 82  Resp: 17 11 13  (!) 21  Temp:      TempSrc:      SpO2: 94% 97% (!) 85% 90%  Weight:      Height:       General.  Frail elderly man, in no acute distress. Pulmonary.  Lungs clear bilaterally, normal respiratory effort. CV.  Regular rate and rhythm, no JVD, rub or murmur. Abdomen.  Soft, nontender, nondistended, BS positive. CNS.  Alert and oriented .  No focal neurologic deficit. Extremities.  No edema, no cyanosis, pulses positive  with Doppler. Psychiatry.  Judgment and insight appears normal.   Data Reviewed: Prior data reviewed  Family Communication:   Disposition: Status is: Inpatient Remains inpatient appropriate because: Severity of illness  Planned Discharge Destination:  To be determined  Time spent: 44 minutes  This record has been created using Conservation officer, historic buildings. Errors have been sought and corrected,but may not always be located. Such creation errors do not reflect on the standard of care.   Author: Arnetha Courser, MD 11/16/2022 3:03 PM  For on call review [email or web address].

## 2022-11-16 NOTE — Progress Notes (Signed)
Pretty significant pain at op sites, no hematoma noted at either site, dressing are clean and dry. Pulses are dopplerable in both feet. Color has improved over the night. Morphine works well for breakthrough pain. He will be fine and have no pain then 10  minutes later it just hits suddenly.

## 2022-11-16 NOTE — Progress Notes (Addendum)
[facility]  Daily Progress Note   Subjective  -   Pain tolerable, no events overnight  Objective Vitals:   11/16/22 0300 11/16/22 0400 11/16/22 0500 11/16/22 0600  BP:      Pulse: 76 77 80 87  Resp: (!) 21 16 18 16   Temp:  98.2 F (36.8 C)    TempSrc:  Oral    SpO2: (!) 89% 92% 94% 91%  Weight:      Height:        Intake/Output Summary (Last 24 hours) at 11/16/2022 0912 Last data filed at 11/16/2022 0710 Gross per 24 hour  Intake 5325.93 ml  Output 2230 ml  Net 3095.93 ml    PULM  BLL rales CV  RRR, on nitroglycerin gtt VASC  B groin Prevena dsg, faintly palpable PTA, 4th toe gangrene (unchanged per prior notes)  Laboratory CBC    Component Value Date/Time   WBC 11.8 (H) 11/16/2022 0614   HGB 11.5 (L) 11/16/2022 0614   HGB 15.7 02/16/2012 2013   HCT 35.0 (L) 11/16/2022 0614   HCT 44.4 02/16/2012 2013   PLT 298 11/16/2022 0614   PLT 176 02/16/2012 2013    BMET    Component Value Date/Time   NA 135 11/16/2022 0614   NA 137 02/16/2012 2013   K 4.0 11/16/2022 0614   K 3.8 02/16/2012 2013   CL 101 11/16/2022 0614   CL 102 02/16/2012 2013   CO2 22 11/16/2022 0614   CO2 25 02/16/2012 2013   GLUCOSE 148 (H) 11/16/2022 0614   GLUCOSE 154 (H) 02/16/2012 2013   BUN 18 11/16/2022 0614   BUN 10 02/16/2012 2013   CREATININE 0.85 11/16/2022 0614   CREATININE 0.72 02/16/2012 2013   CALCIUM 8.2 (L) 11/16/2022 0614   CALCIUM 9.6 02/16/2012 2013   GFRNONAA >60 11/16/2022 0614   GFRNONAA >60 02/16/2012 2013   GFRAA >60 12/09/2018 0929   GFRAA >60 02/16/2012 2013    Assessment/Planning: POD #1 s/p B fem EA, R fem-pop TE, PTA+S B CIA and L EIA, PTA R PTA, DCB PTA R pop  Weaning off nitroglycerin D/C foley D/C a-line once off nitroglycerin Pt started on ASA today.  PT reported on DAPT + Eliquis preop. Also pt baseline on Eliquis, needs to be on Heparin drip.  If no significant bleeding over next day, will restart Eliquis unless Podiatry  planning on any procedures.  I don't see any notes from them.  Leonides Sake, MD, FACS, FSVS covering for [facility]  11/16/2022, 9:12 AM

## 2022-11-16 NOTE — Progress Notes (Signed)
Patient's arterial line has been zeroed and repositioned, line unable to detect accurate B/P due to continuous movement of arm and positioning.  Patient encouraged to keep arm level as best as possible.  Patient has expressed how exhausted he is due to lack of sleep on night shift and requesting to have foley removed later in the shift.  Patient voices comfort in his current position and desires sleep at this time. Will continue to monitor patient.

## 2022-11-16 NOTE — Plan of Care (Signed)

## 2022-11-16 NOTE — Progress Notes (Signed)
Patient's arterial line was initially reading low and at that time patient was noted to be moving and repositioning in the bed.  Since this time the arterial line has been zeroed and B/P reading is 103/37 (57) despite nitroglycerine gtt being turned off at 10:57. Manual B/P was obtained and reading is 102/61 (73), other vital signs are HR 74, RR 18, and O2 sat 98%. The patient is alert and oriented, voicing the need for sleep as he is exhausted from being up all night.  Dr. Nelson Chimes and Dr. Imogene Burn given update on B/P measurements.  Will continue to monitor at this time.  T

## 2022-11-16 NOTE — Anesthesia Postprocedure Evaluation (Signed)
Anesthesia Post Note  Patient: Manuel [name]  Procedure(s) Performed: BILATERAL COMMON FEMORAL PROFUNDA FEMORIS AND SUPERFICIAL FEMORAL ARTERY ENDARTECTOMIES, RIGHT FOGARTY EMBOLECTOMY OF THE RIGHT SFA  AND  POPLITEAL ARTERIES. AORTAGRAM AND RIGHT LOWER EXTREMITY ANGIOGRAM. (Bilateral) BILATERAL STENT INSERTION IN BILATERAL  COMMON ILIAC ARTERY, STENT INSERTION OF LEFT EXTERNAL ILIAC ARTERY. ANGIOPLASTY RIGHT TIBIAL  AND POPLITEAL ARTERY. (Bilateral) APPLICATION OF CELL SAVER (Bilateral)  Patient location during evaluation: SICU Anesthesia Type: General Level of consciousness: awake and alert and oriented Pain management: pain level controlled Vital Signs Assessment: post-procedure vital signs reviewed and stable Respiratory status: spontaneous breathing, patient connected to nasal cannula oxygen, nonlabored ventilation and respiratory function stable Cardiovascular status: stable Postop Assessment: no apparent nausea or vomiting Anesthetic complications: no   No notable events documented.   Last Vitals:  Vitals:   11/16/22 0600 11/16/22 0900  BP:    Pulse: 87 78  Resp: 16 11  Temp:    SpO2: 91% 94%    Last Pain:  Vitals:   11/16/22 0900  TempSrc:   PainSc: 7                  [name]

## 2022-11-16 NOTE — Consult Note (Deleted)
ANTICOAGULATION CONSULT NOTE  Pharmacy Consult for Heparin Infusion Indication: chest pain/ACS  No Known Allergies  Patient Measurements: Height: 5\' 8"  (172.7 cm) Weight: 88.5 kg (195 lb 1.7 oz) IBW/kg (Calculated) : 68.4 Heparin Dosing Weight: 85.7 kg  Vital Signs: Temp: 98.2 F (36.8 C) (07/27 0400) Temp Source: Oral (07/27 0400) BP: 175/75 (07/26 2117) Pulse Rate: 87 (07/27 0600)  Labs: Recent Labs    11/14/22 1446 11/15/22 0506 11/15/22 1825 11/15/22 2354 11/16/22 0614 11/16/22 0747  HGB  --  13.2 12.7* 12.0* 11.5*  --   HCT  --  38.2* 38.5* 35.8* 35.0*  --   PLT  --  336 287 281 298  --   HEPARINUNFRC 0.47 0.60  --   --   --  0.39  CREATININE  --   --  0.88 0.86 0.85  --     Estimated Creatinine Clearance: 76.2 mL/min (by C-G formula based on SCr of 0.85 mg/dL).   Medical History: Past Medical History:  Diagnosis Date   Allergies    Arthritis    Diabetes mellitus without complication (HCC)    Gout    Hard of hearing    Hyperlipidemia    Hypertension    Myocardial infarction [facility]) 2008   treated here at [facility] and transferred to [place] for stent placement   Neuropathy    knees down   Peripheral vascular disease (HCC)    Sleep apnea    "in the past"   Status post primary angioplasty with coronary stent    Wears dentures    full upper and lower    Assessment: CHARLTON HERZFELD is a 77 y.o. male with past medical history of PVD s/p mechanical thrombectomy of right lower extremity in February 2023, CAD, T2DM, HLD, neuropathy. He was on a DOAC previously, but is not on anticoagulant therapy currently. He is here with pain to the right foot and a black toe on the right foot starting approximately 3 days ago. Vascular surgery consulted and planning angiography 7/22. Pharmacy has been consulted to initiate and manage heparin infusion.   7/25 1446 HL 0.47 7/26 0506 HL 0.60, therapeutic x 2 7/27 0747 HL 0.39   Goal of Therapy:  Heparin level 0.3-0.7  units/ml Monitor platelets by anticoagulation protocol: Yes  Plan:  Heparin remains therapeutic Continue heparin infusion at 2250 units/hr  Heparin level with AM labs Recheck heparin level and CBC daily with AM labs.    Sharen Hones, PharmD, BCPS Clinical Pharmacist   11/16/2022 8:52 AM

## 2022-11-16 NOTE — Progress Notes (Signed)
PHARMACIST - PHYSICIAN COMMUNICATION  CONCERNING: IV to Oral Route Change Policy  RECOMMENDATION: This patient is receiving Pepcid by the intravenous route.  Based on criteria approved by the Pharmacy and Therapeutics Committee, the intravenous medication(s) is/are being converted to the equivalent oral dose form(s).  DESCRIPTION: These criteria include: The patient is eating (either orally or via tube) and/or has been taking other orally administered medications for a least 24 hours The patient has no evidence of active gastrointestinal bleeding or impaired GI absorption (gastrectomy, short bowel, patient on TNA or NPO).  If you have questions about this conversion, please contact the Pharmacy Department   Tressie Ellis, [facility] 11/16/2022 9:00 AM

## 2022-11-17 DIAGNOSIS — I998 Other disorder of circulatory system: Secondary | ICD-10-CM | POA: Diagnosis not present

## 2022-11-17 DIAGNOSIS — E1142 Type 2 diabetes mellitus with diabetic polyneuropathy: Secondary | ICD-10-CM | POA: Diagnosis not present

## 2022-11-17 DIAGNOSIS — I2510 Atherosclerotic heart disease of native coronary artery without angina pectoris: Secondary | ICD-10-CM | POA: Diagnosis not present

## 2022-11-17 DIAGNOSIS — L97909 Non-pressure chronic ulcer of unspecified part of unspecified lower leg with unspecified severity: Secondary | ICD-10-CM | POA: Diagnosis not present

## 2022-11-17 LAB — BASIC METABOLIC PANEL WITH GFR
Anion gap: 6 (ref 5–15)
BUN: 22 mg/dL (ref 8–23)
CO2: 25 mmol/L (ref 22–32)
Calcium: 7.7 mg/dL — ABNORMAL LOW (ref 8.9–10.3)
Chloride: 103 mmol/L (ref 98–111)
Creatinine, Ser: 1.31 mg/dL — ABNORMAL HIGH (ref 0.61–1.24)
GFR, Estimated: 55 mL/min — ABNORMAL LOW (ref >60)
Glucose, Bld: 132 mg/dL — ABNORMAL HIGH (ref 70–99)
Potassium: 4.1 mmol/L (ref 3.5–5.1)
Sodium: 134 mmol/L — ABNORMAL LOW (ref 135–145)

## 2022-11-17 LAB — CBC
HCT: 27.8 % — ABNORMAL LOW (ref 39.0–52.0)
Hemoglobin: 9.4 g/dL — ABNORMAL LOW (ref 13.0–17.0)
MCH: 30.1 pg (ref 26.0–34.0)
MCHC: 33.8 g/dL (ref 30.0–36.0)
MCV: 89.1 fL (ref 80.0–100.0)
Platelets: 230 10*3/uL (ref 150–400)
RBC: 3.12 MIL/uL — ABNORMAL LOW (ref 4.22–5.81)
RDW: 13.7 % (ref 11.5–15.5)
WBC: 10.9 10*3/uL — ABNORMAL HIGH (ref 4.0–10.5)
nRBC: 0 % (ref 0.0–0.2)

## 2022-11-17 LAB — GLUCOSE, CAPILLARY
Glucose-Capillary: 154 mg/dL — ABNORMAL HIGH (ref 70–99)
Glucose-Capillary: 183 mg/dL — ABNORMAL HIGH (ref 70–99)
Glucose-Capillary: 108 mg/dL — ABNORMAL HIGH (ref 70–99)
Glucose-Capillary: 118 mg/dL — ABNORMAL HIGH (ref 70–99)
Glucose-Capillary: 182 mg/dL — ABNORMAL HIGH (ref 70–99)

## 2022-11-17 LAB — HEPARIN LEVEL (UNFRACTIONATED): Heparin Unfractionated: 0.6 [IU]/mL (ref 0.30–0.70)

## 2022-11-17 MED ORDER — CALCIUM CARBONATE 1250 (500 CA) MG PO TABS
1.0000 | ORAL_TABLET | Freq: Two times a day (BID) | ORAL | Status: DC
  Administered 2022-11-17 – 2022-11-21 (×7): 1250 mg via ORAL
  Filled 2022-11-17 (×10): qty 1

## 2022-11-17 MED ORDER — FE FUM-VIT C-VIT B12-FA 460-60-0.01-1 MG PO CAPS
1.0000 | ORAL_CAPSULE | Freq: Two times a day (BID) | ORAL | Status: DC
  Administered 2022-11-17 – 2022-11-21 (×10): 1 via ORAL
  Filled 2022-11-17 (×11): qty 1

## 2022-11-17 NOTE — Plan of Care (Signed)

## 2022-11-17 NOTE — Progress Notes (Signed)
Progress Note   Patient: Manuel [name] SWN:[id number] DOB: 05-Aug-1945 DOA: 11/10/2022     7 DOS: the patient was seen and examined on 11/17/2022   Brief hospital course: Taken from H&P.   LAREY HERTEL is a 77 y.o. male with medical history significant of  insulin-dependent DM, HTN, CAD, PAD s/p angioplasty with stent 05/14/2021 presenting with right lower extremity ischemia and ulcer formation with gangrenous toe.  Presented to the ER afebrile, hemodynamically stable. White count 11.2, hemoglobin 15.2, platelets 303, lactate 1.2, creatinine 0.9. T. bili 1.5. Plain films of the right foot with no signs of osteomyelitis. Dr. [name] with vascular surgery initially consulted with recommendation for heparin drip. Dr. [name] with podiatry also consulted for further evaluation.   7/22: Vital stable.  Going for lower extremity angiography and possible angioplasty with vascular today.  CRP elevated at 10.1, decreased prealbumin at 15 and normal ESR.  Preliminary blood cultures negative.  Continue current broad-spectrum antibiotics.  Found to have very extensive disease and most likely will required bilateral femoral endarterectomies and bilateral iliac stents.  Vascular is looking for a OR time for this extensive procedure which will require 4 to 5 hours.  7/23: Vital and labs stable.  Overnight some shortness of breath and crackles.  One-time 40 mg of IV Lasix was given.  Chest x-ray ordered but patient refused. Patient had a choking spell on meds this morning, continued to feel something sticking in his chest.  No hypoxia.  Chest x-ray with diffuse interstitial prominence and areas of nodularity throughout the both lungs.  Differential include atypical inflammatory process versus atypical pulmonary edema.  Ordered 1 dose of IV Lasix Going for his extensive vascular procedure tomorrow.  7/24: Mildly elevated blood pressure.  BNP elevated at 932, surgery got canceled for concern of acute on chronic  heart failure and patient now need cardiology clearance before proceeding. Family concern of suicidal thoughts, psych was consulted.  7/25: Hemodynamically stable.  Will echocardiogram with EF of 25 to 30% with some regional wall motion abnormalities.  Likely weak going for vascular surgery tomorrow, pending final recommendations from cardiology. Psychiatry with no suicidal thoughts.  7/26: Hemodynamically stable.  Going for vascular procedure today.  7/27: Vital stable.  Patient underwent extensive vascular procedure which involved bilateral lower extremity endarterectomies and multiple stents placement.  Pulses present with Doppler this morning.  Significant blood loss during the procedure, hemoglobin decreased to 11.5 this morning, baseline of 13.  Started on supplement.  7/28: Vital stable.  Labs with increase in creatinine to 1.31-holding Lasix today.  Hemoglobin further decreased to 9.4.  Likely be going for toe amputation tomorrow.  Arterial line removed.  Assessment and Plan: * Ischemic ulcer of lower extremity (HCC) Worsening right lower extremity redness swelling and pain over the past 2 weeks in the setting of baseline limb ischemia status post angioplasty and stenting January 2023.Noted worsening ulcer formation across multiple digits of the foot with concern for gangrene Patient reports compliance with Plavix and statin therapy Started on heparin drip in the ER  Vascular procedure with extensive peripheral vascular disease, second procedure for bilateral endarterectomies and bilateral iliac stent placement with vascular surgery was done yesterday, tolerated the procedure well.  Palpable pulses today. -Broad-spectrum antibiotics switched with Augmentin -Likely be going for fourth and fifth toe amputation on Monday with podiatry     Diabetic polyneuropathy associated with type 2 diabetes mellitus (HCC) Cont neurontin    Coronary artery disease with history of myocardial  infarction without history of CABG Followed by Dr. [name] outpt  CAD s/p PCI to [facility] with overlapping 3 mm Xience stents of 23 mm and 12 mm length in 2011  No active chest pain On plavix monotherapy and statin chronically  On heparin drip in the setting of right lower extremity ischemia Follow  Hypertension Patient required nitro GGT after the procedure which was now being tapered off Titrate home regimen  Arterial line removed today   Diabetes mellitus type 2, insulin dependent (HCC) SSI   Alcohol abuse 3-4 beer daily drinker + mixed drinks  Will place on CIWA protocol  Follow   Mixed hyperlipidemia Continue statin  Adjustment disorder with depressed mood Family was concerned about some suicidal thoughts so psychiatry was consulted. They cleared him today as patient denies any suicidal thoughts, stating that he was just frustrated  Acute on chronic combined systolic and diastolic CHF (congestive heart failure) (HCC) According to a prior echo done in 2020, EF of 40 to 45%. Patient with concern of shortness of breath, mild hypoxia and chest x-ray with bilateral opacities.  BNP elevated at 932. Repeat echocardiogram with further decrease of EF to 20 to 25% with regional wall motion abnormalities. Cardiology is on board- -Holding IV Lasix today -Daily weight and BMP -Strict intake and output   Subjective: Patient was sitting at the side of the bed when seen today.  No new concern.  Feeling much improved  Physical Exam: Vitals:   11/17/22 1100 11/17/22 1200 11/17/22 1229 11/17/22 1230  BP:   (!) 124/59 (!) 119/57  Pulse: 80 77  81  Resp:      Temp:    98.3 F (36.8 C)  TempSrc:    Oral  SpO2: 91% 95%  97%  Weight:      Height:       General.  Frail elderly man, in no acute distress. Pulmonary.  Lungs clear bilaterally, normal respiratory effort. CV.  Regular rate and rhythm, no JVD, rub or murmur. Abdomen.  Soft, nontender, nondistended, BS positive. CNS.   Alert and oriented .  No focal neurologic deficit. Extremities.  No edema, no cyanosis, palpable pedal pulses today. Psychiatry.  Judgment and insight appears normal.   Data Reviewed: Prior data reviewed  Family Communication:   Disposition: Status is: Inpatient Remains inpatient appropriate because: Severity of illness  Planned Discharge Destination:  To be determined  Time spent: 42 minutes  This record has been created using Conservation officer, historic buildings. Errors have been sought and corrected,but may not always be located. Such creation errors do not reflect on the standard of care.   Author: Arnetha Courser, MD 11/17/2022 12:51 PM  For on call review [email or web address].

## 2022-11-17 NOTE — Evaluation (Addendum)
Occupational Therapy Evaluation Patient Details Name: Manuel [name] MRN: [id number] DOB: 1946-02-14 Today's Date: 11/17/2022   History of Present Illness Pt is a 77 year old male presenting with right lower extremity ischemia and ulcer formation with gangrenous toe now s/p left common femoral, profunda femoris, and superficial femoral artery endarterectomies, Right common femoral, profunda femoris, and superficial femoral artery endarterectomies, stent placements in bilateral common iliac arteries, Additional stent placement x 2 to the left external iliac artery, Angioplasty of the right posterior tibial artery, Angioplasty of the right popliteal artery on 11/15/22;   Pmh significant for insulin-dependent DM, HTN, CAD, PAD s/p angioplasty with stent 05/14/2021   Clinical Impression   Chart reviewed, pt greeted in bed, agreeable to OT evaluation. Pt is alert and oriented x4, requires encouragement for participation. PTA pt is MOD I-I in ADL/IADL, amb with no AD; pt does report decrease in community ambulation over the last few years (used to go hiking frequently). Pt presents with deficits in strength, endurance, activity tolerance, balance all affecting safe and optimal ADL completion. CGA-MIN A required for bed mobility, STS with MIN-MOD A, amb approx 4' in room stepping forward and back with RW with heavy reliance on RW with CGA-MIN A, vcs required throughout for technique. Of note, pt has been up to commode overnight. SET UP for grooming/feeding tasks, MAX A for LB dressing. Anticipate assistance for all bathing tasks. Nurse requested pt back in bed for a line removal. Pt is left as received, all needs met. OT will continue to follow acutely.      Recommendations for follow up therapy are one component of a multi-disciplinary discharge planning process, led by the attending physician.  Recommendations may be updated based on patient status, additional functional criteria and insurance authorization.    Assistance Recommended at Discharge Intermittent Supervision/Assistance  Patient can return home with the following A little help with walking and/or transfers;A lot of help with bathing/dressing/bathroom    Functional Status Assessment  Patient has had a recent decline in their functional status and demonstrates the ability to make significant improvements in function in a reasonable and predictable amount of time.  Equipment Recommendations  BSC/3in1;Other (comment) (2WW)    Recommendations for Other Services       Precautions / Restrictions Precautions Precautions: Fall Precaution Comments: B wound vacs Restrictions Weight Bearing Restrictions: No      Mobility Bed Mobility Overal bed mobility: Needs Assistance Bed Mobility: Supine to Sit, Sit to Supine     Supine to sit: Min assist Sit to supine: Mod assist   General bed mobility comments: step by step vcs for technique, pt able to boost up the bed with supervision    Transfers Overall transfer level: Needs assistance Equipment used: Rolling walker (2 wheels) Transfers: Sit to/from Stand Sit to Stand: Min assist, Mod assist           General transfer comment: from regular bed height with RW, step by step vcs for RW use      Balance Overall balance assessment: Needs assistance Sitting-balance support: Feet supported Sitting balance-Leahy Scale: Good     Standing balance support: Bilateral upper extremity supported, During functional activity, Reliant on assistive device for balance Standing balance-Leahy Scale: Fair                             ADL either performed or assessed with clinical judgement   ADL Overall ADL's : Needs assistance/impaired Eating/Feeding:  Sitting;Set up Eating/Feeding Details (indicate cue type and reason): vcs for aspiration precautions Grooming: Set up;Sitting           Upper Body Dressing : Set up Upper Body Dressing Details (indicate cue type and  reason): anticipate Lower Body Dressing: Maximal assistance Lower Body Dressing Details (indicate cue type and reason): socks Toilet Transfer: Min guard;Minimal assistance;Cueing for safety;Cueing for sequencing;Ambulation;Rolling walker (2 wheels) Toilet Transfer Details (indicate cue type and reason): simulated with RW         Functional mobility during ADLs: Min guard;Minimal assistance;Rolling walker (2 wheels);Cueing for sequencing (approx 4' in room, simulated transfers)       Vision Patient Visual Report: No change from baseline       Perception     Praxis      Pertinent Vitals/Pain Pain Assessment Pain Assessment: Faces Faces Pain Scale: Hurts little more Pain Location: "gas bubbles" Pain Descriptors / Indicators: Discomfort Pain Intervention(s): Monitored during session, Repositioned, RN gave pain meds during session     Hand Dominance     Extremity/Trunk Assessment Upper Extremity Assessment Upper Extremity Assessment: Overall WFL for tasks assessed (formal assessment limited by a line- will continue to assess however appears grossly [facility])   Lower Extremity Assessment Lower Extremity Assessment: LLE deficits/detail;Generalized weakness       Communication Communication Communication: No difficulties   Cognition Arousal/Alertness: Awake/alert Behavior During Therapy: WFL for tasks assessed/performed Overall Cognitive Status: Within Functional Limits for tasks assessed                                 General Comments: tangential at times, good follow through with one step directives     General Comments  vss throughout; all lines/leads/wound vac intact pre/post session; one IV line ?leaking, nurse notified/aware    Exercises Other Exercises Other Exercises: edu pt and wife re: role of OT, role of rehab, discharge recommendations, home safety, falls prevention, DME use, importance of continued mobility while admitted   Shoulder  Instructions      Home Living Family/patient expects to be discharged to:: Private residence Living Arrangements: Spouse/significant other Available Help at Discharge: Family;Available 24 hours/day Type of Home: House Home Access: Stairs to enter [employer] of Steps: 5-6 +1   Home Layout: Multi-level;Laundry or work area in basement (recreational living area in the basement) Alternate Teacher, music of Steps: flight   Bathroom Shower/Tub: Dietitian: None   Additional Comments: report they may have equipment they can borrow from family members      Prior Functioning/Environment Prior Level of Function : Independent/Modified Independent;Driving             Mobility Comments: amb with no AD short community distances ADLs Comments: mod I-I in ADL/IADL        OT Problem List: Decreased strength;Impaired balance (sitting and/or standing);Decreased activity tolerance;Decreased knowledge of use of DME or AE;Decreased knowledge of precautions;Decreased safety awareness      OT Treatment/Interventions: Self-care/ADL training;Balance training;Therapeutic exercise;DME and/or AE instruction;Therapeutic activities;Patient/family education    OT Goals(Current goals can be found in the care plan section) Acute Rehab OT Goals Patient Stated Goal: go home OT Goal Formulation: With patient/family Time For Goal Achievement: 12/01/22 Potential to Achieve Goals: Good ADL Goals Pt Will Perform Grooming: with modified independence Pt Will Perform Lower Body Dressing: with modified independence Pt Will Transfer to Toilet: with modified  independence;ambulating Pt Will Perform Toileting - Clothing Manipulation and hygiene: with modified independence;sit to/from stand  OT Frequency: Min 1X/week    Co-evaluation              AM-PAC OT "6 Clicks" Daily Activity     Outcome Measure Help from another person eating meals?: None Help from  another person taking care of personal grooming?: None Help from another person toileting, which includes using toliet, bedpan, or urinal?: A Lot Help from another person bathing (including washing, rinsing, drying)?: A Lot Help from another person to put on and taking off regular upper body clothing?: A Little Help from another person to put on and taking off regular lower body clothing?: A Lot 6 Click Score: 17   End of Session Equipment Utilized During Treatment: Rolling walker (2 wheels) Nurse Communication: Mobility status  Activity Tolerance: Patient tolerated treatment well Patient left: in bed;with call bell/phone within reach;with bed alarm set;with nursing/sitter in room;with family/visitor present;with SCD's reapplied (MD in room)  OT Visit Diagnosis: Other abnormalities of gait and mobility (R26.89);Unsteadiness on feet (R26.81);Muscle weakness (generalized) (M62.81)                Time: 6045-4098 OT Time Calculation (min): 50 min Charges:  OT General Charges $OT Visit: 1 Visit OT Evaluation $OT Eval High Complexity: 1 High OT Treatments $Therapeutic Activity: 8-22 mins Oleta Mouse, OTD OTR/L  11/17/22, 9:49 AM

## 2022-11-17 NOTE — Progress Notes (Signed)
[facility]  Daily Progress Note   Subjective  -   Feeling much better today, no events overnight  Objective Vitals:   11/17/22 0730 11/17/22 0800 11/17/22 0900 11/17/22 1000  BP: 135/72 (!) 149/70  126/61  Pulse: 89 85 81 83  Resp:      Temp:  98.2 F (36.8 C)    TempSrc:  Oral    SpO2: 92% 93% 92% 92%  Weight:      Height:        Intake/Output Summary (Last 24 hours) at 11/17/2022 1022 Last data filed at 11/17/2022 1000 Gross per 24 hour  Intake 2012.02 ml  Output 800 ml  Net 1212.02 ml    PULM  BLL rales, improved rales from yesterday CV  RRR VASC  B groin Prevena dsg, dopplerable R ATA, L PTA, feet dressed (just changed, See Pod note)  Laboratory CBC    Component Value Date/Time   WBC 10.9 (H) 11/17/2022 0526   HGB 9.4 (L) 11/17/2022 0526   HGB 15.7 02/16/2012 2013   HCT 27.8 (L) 11/17/2022 0526   HCT 44.4 02/16/2012 2013   PLT 230 11/17/2022 0526   PLT 176 02/16/2012 2013    BMET    Component Value Date/Time   NA 134 (L) 11/17/2022 0526   NA 137 02/16/2012 2013   K 4.1 11/17/2022 0526   K 3.8 02/16/2012 2013   CL 103 11/17/2022 0526   CL 102 02/16/2012 2013   CO2 25 11/17/2022 0526   CO2 25 02/16/2012 2013   GLUCOSE 132 (H) 11/17/2022 0526   GLUCOSE 154 (H) 02/16/2012 2013   BUN 22 11/17/2022 0526   BUN 10 02/16/2012 2013   CREATININE 1.31 (H) 11/17/2022 0526   CREATININE 0.72 02/16/2012 2013   CALCIUM 7.7 (L) 11/17/2022 0526   CALCIUM 9.6 02/16/2012 2013   GFRNONAA 55 (L) 11/17/2022 0526   GFRNONAA >60 02/16/2012 2013   GFRAA >60 12/09/2018 0929   GFRAA >60 02/16/2012 2013    Assessment/Planning: POD #2 s/p B fem EA, R fem-pop TE, PTA+S B CIA and L EIA, PTA R PTA, DCB PTA R pop   Ok to transfer to Progressive care Continue ASA and Heparin drip POD planning R 4th-5th toe amp tomorrow afternoon.  NPO after MN in case moves up Hold on eliquis  Leonides Sake, MD, FACS, FSVS covering for  Vein [facility] Vascular  Surgery   11/17/2022, 10:22 AM

## 2022-11-17 NOTE — Progress Notes (Signed)
PT Cancellation Note  Patient Details Name: Manuel [name] MRN: [id number] DOB: 04/08/1946   Cancelled Treatment:    Reason Eval/Treat Not Completed: Per OT pt had requested to participate with PT services this date but upon entry to room pt declined to participate secondary to fatigue.  Discussed with pt holding PT eval going forward until after surgery scheduled for 11/18/22, pt in agreement.  Will attempt to see pt at a future date/time as medically appropriate.      Ovidio Hanger PT, DPT 11/17/22, 11:03 AM

## 2022-11-17 NOTE — Plan of Care (Signed)
Continuing with plan of care. 

## 2022-11-17 NOTE — Progress Notes (Signed)
Patient ID: Manuel [name], male   DOB: 22-Sep-1945, 78 y.o.   MRN: [id number] [facility]    SUBJECTIVE: Stable resting comfortably in bed reduced pain has had trouble sleeping last night but in general feels much improved   Vitals:   11/17/22 0800 11/17/22 0900 11/17/22 1000 11/17/22 1100  BP: (!) 149/70  126/61   Pulse: 85 81 83 80  Resp:      Temp: 98.2 F (36.8 C)     TempSrc: Oral     SpO2: 93% 92% 92% 91%  Weight:      Height:         Intake/Output Summary (Last 24 hours) at 11/17/2022 1203 Last data filed at 11/17/2022 1100 Gross per 24 hour  Intake 1936.31 ml  Output 850 ml  Net 1086.31 ml      PHYSICAL EXAM  General: Well developed, well nourished, in no acute distress HEENT:  Normocephalic and atramatic Neck:  No JVD.  Lungs: Clear bilaterally to auscultation and percussion. Heart: HRRR . Normal S1 and S2 without gallops or murmurs.  Abdomen: Bowel sounds are positive, abdomen soft and non-tender  Msk:  Back normal, normal gait. Normal strength and tone for age. Extremities: No clubbing, cyanosis or edema.   Neuro: Alert and oriented X 3. Psych:  Good affect, responds appropriately   LABS: Basic Metabolic Panel: Recent Labs    11/16/22 0614 11/17/22 0526  NA 135 134*  K 4.0 4.1  CL 101 103  CO2 22 25  GLUCOSE 148* 132*  BUN 18 22  CREATININE 0.85 1.31*  CALCIUM 8.2* 7.7*   Liver Function Tests: No results for input(s): "AST", "ALT", "ALKPHOS", "BILITOT", "PROT", "ALBUMIN" in the last 72 hours. No results for input(s): "LIPASE", "AMYLASE" in the last 72 hours. CBC: Recent Labs    11/16/22 0614 11/17/22 0526  WBC 11.8* 10.9*  HGB 11.5* 9.4*  HCT 35.0* 27.8*  MCV 91.1 89.1  PLT 298 230   Cardiac Enzymes: No results for input(s): "CKTOTAL", "CKMB", "CKMBINDEX", "TROPONINI" in the last 72 hours. BNP: Invalid input(s): "POCBNP" D-Dimer: No results for input(s): "DDIMER" in the last 72 hours. Hemoglobin A1C: No results for input(s):  "HGBA1C" in the last 72 hours. Fasting Lipid Panel: No results for input(s): "CHOL", "HDL", "LDLCALC", "TRIG", "CHOLHDL", "LDLDIRECT" in the last 72 hours. Thyroid Function Tests: No results for input(s): "TSH", "T4TOTAL", "T3FREE", "THYROIDAB" in the last 72 hours.  Invalid input(s): "FREET3" Anemia Panel: No results for input(s): "VITAMINB12", "FOLATE", "FERRITIN", "TIBC", "IRON", "RETICCTPCT" in the last 72 hours.  DG C-Arm 1-60 Min-No Report  Result Date: 11/15/2022 Fluoroscopy was utilized by the requesting physician.  No radiographic interpretation.     Echo preserved left ventricular function EF of 55%  TELEMETRY: Normal sinus rhythm rate of 80 nonspecific ST-T wave changes:  ASSESSMENT AND PLAN:  Principal Problem:   Ischemic ulcer of lower extremity (HCC) Active Problems:   Hypertension   Coronary artery disease with history of myocardial infarction without history of CABG   Diabetes mellitus type 2, insulin dependent (HCC)   Diabetic polyneuropathy associated with type 2 diabetes mellitus (HCC)   Mixed hyperlipidemia   Alcohol abuse   Limb ischemia   Acute on chronic combined systolic and diastolic CHF (congestive heart failure) (HCC)   Adjustment disorder with depressed mood    Plan .  Peripheral vascular disease procedure for critical limb ischemia patient improved less pain Right fourth toe dry gangrene may need amputation as per vascular Hypertension reasonably controlled  continue current therapy Diabetes type II uncomplicated continue current therapy History of coronary bypass surgery denies angina or shortness of breath continue current therapy Hyperlipidemia continue statin therapy for lipid management Continue recovery from peripheral vascular disease procedure recommend rehab Continue conservative cardiac input at this stage   [name], MD, 11/17/2022 12:03 PM

## 2022-11-17 NOTE — Consult Note (Signed)
ANTICOAGULATION CONSULT NOTE  Pharmacy Consult for Heparin Infusion Indication: chest pain/ACS  No Known Allergies  Patient Measurements: Height: 5\' 8"  (172.7 cm) Weight: 88.5 kg (195 lb 1.7 oz) IBW/kg (Calculated) : 68.4 Heparin Dosing Weight: 85.7 kg  Vital Signs: BP: 113/51 (07/27 2106) Pulse Rate: 83 (07/27 2106)  Labs: Recent Labs    11/15/22 0506 11/15/22 1825 11/15/22 2354 11/16/22 0614 11/16/22 0747 11/17/22 0526  HGB 13.2   < > 12.0* 11.5*  --  9.4*  HCT 38.2*   < > 35.8* 35.0*  --  27.8*  PLT 336   < > 281 298  --  230  HEPARINUNFRC 0.60  --   --   --  0.39 0.60  CREATININE  --    < > 0.86 0.85  --  1.31*   < > = values in this interval not displayed.    Estimated Creatinine Clearance: 49.4 mL/min (A) (by C-G formula based on SCr of 1.31 mg/dL (H)).   Medical History: Past Medical History:  Diagnosis Date   Allergies    Arthritis    Diabetes mellitus without complication (HCC)    Gout    Hard of hearing    Hyperlipidemia    Hypertension    Myocardial infarction [facility]) 2008   treated here at [facility] and transferred to [place] for stent placement   Neuropathy    knees down   Peripheral vascular disease (HCC)    Sleep apnea    "in the past"   Status post primary angioplasty with coronary stent    Wears dentures    full upper and lower    Assessment: Manuel [name] is a 77 y.o. male with past medical history of PVD s/p mechanical thrombectomy of right lower extremity in February 2023, CAD, T2DM, HLD, neuropathy. He was on a DOAC previously, but is not on anticoagulant therapy currently. He is here with pain to the right foot and a black toe on the right foot starting approximately 3 days ago. Vascular surgery consulted and planning angiography 7/22. Pharmacy has been consulted to initiate and manage heparin infusion.   7/25 1446 HL 0.47 7/26 0506 HL 0.60, therapeutic x 2 7/27 0747 HL 0.39   Goal of Therapy:  Heparin level 0.3-0.7  units/ml Monitor platelets by anticoagulation protocol: Yes  Plan:  Heparin remains therapeutic Continue heparin infusion at 2250 units/hr  Heparin level with AM labs Recheck heparin level and CBC daily with AM labs.   Otelia Sergeant, PharmD, [facility] 11/17/2022 5:57 AM

## 2022-11-17 NOTE — Progress Notes (Signed)
Arterial line removed as ordered by Dr. Imogene Burn, pressure dressing applied, patient tolerated well.

## 2022-11-17 NOTE — Progress Notes (Signed)
Report given to Harrold Donath, RN receiving nurse for room 249, patient being transferred in hospital bed, on cardiac monitoring, and with all belongings.

## 2022-11-18 ENCOUNTER — Inpatient Hospital Stay: Payer: Medicare Other | Admitting: Anesthesiology

## 2022-11-18 ENCOUNTER — Encounter: Payer: Self-pay | Admitting: Vascular Surgery

## 2022-11-18 ENCOUNTER — Encounter
Admission: EM | Disposition: A | Discharge status: Home Health | Payer: Self-pay | Source: Home / Self Care | Attending: Internal Medicine

## 2022-11-18 DIAGNOSIS — E1142 Type 2 diabetes mellitus with diabetic polyneuropathy: Secondary | ICD-10-CM | POA: Diagnosis not present

## 2022-11-18 DIAGNOSIS — L97909 Non-pressure chronic ulcer of unspecified part of unspecified lower leg with unspecified severity: Secondary | ICD-10-CM | POA: Diagnosis not present

## 2022-11-18 DIAGNOSIS — I998 Other disorder of circulatory system: Secondary | ICD-10-CM | POA: Diagnosis not present

## 2022-11-18 DIAGNOSIS — I2510 Atherosclerotic heart disease of native coronary artery without angina pectoris: Secondary | ICD-10-CM | POA: Diagnosis not present

## 2022-11-18 HISTORY — PX: AMPUTATION: SHX166

## 2022-11-18 LAB — GLUCOSE, CAPILLARY
Glucose-Capillary: 171 mg/dL — ABNORMAL HIGH (ref 70–99)
Glucose-Capillary: 141 mg/dL — ABNORMAL HIGH (ref 70–99)
Glucose-Capillary: 138 mg/dL — ABNORMAL HIGH (ref 70–99)
Glucose-Capillary: 124 mg/dL — ABNORMAL HIGH (ref 70–99)

## 2022-11-18 SURGERY — AMPUTATION, FOOT, RAY
Anesthesia: General | Site: Foot | Laterality: Right

## 2022-11-18 MED ORDER — LIDOCAINE HCL (CARDIAC) PF 100 MG/5ML IV SOSY
PREFILLED_SYRINGE | INTRAVENOUS | Status: DC | PRN
  Administered 2022-11-18: 60 mg via INTRAVENOUS

## 2022-11-18 MED ORDER — LACTATED RINGERS IV SOLN: INTRAVENOUS | Status: DC | PRN

## 2022-11-18 MED ORDER — HEPARIN (PORCINE) 25000 UT/250ML-% IV SOLN
2250.0000 [IU]/h | INTRAVENOUS | Status: DC
  Administered 2022-11-18 – 2022-11-19 (×2): 2250 [IU]/h via INTRAVENOUS
  Filled 2022-11-18 (×2): qty 250

## 2022-11-18 MED ORDER — 0.9 % SODIUM CHLORIDE (POUR BTL) OPTIME
TOPICAL | Status: DC | PRN
  Administered 2022-11-18: 400 mL

## 2022-11-18 MED ORDER — VASOPRESSIN 20 UNIT/ML IV SOLN
INTRAVENOUS | Status: DC | PRN
Start: 2022-11-18 — End: 2022-11-18
  Administered 2022-11-18: 2 [IU] via INTRAVENOUS

## 2022-11-18 MED ORDER — FENTANYL CITRATE (PF) 100 MCG/2ML IJ SOLN
INTRAMUSCULAR | Status: AC
  Filled 2022-11-18: qty 2

## 2022-11-18 MED ORDER — PROPOFOL 500 MG/50ML IV EMUL
INTRAVENOUS | Status: DC | PRN
  Administered 2022-11-18: 100 ug/kg/min via INTRAVENOUS

## 2022-11-18 MED ORDER — FENTANYL CITRATE (PF) 100 MCG/2ML IJ SOLN: 25.0000 ug | INTRAMUSCULAR | Status: DC | PRN

## 2022-11-18 MED ORDER — PROPOFOL 10 MG/ML IV BOLUS
INTRAVENOUS | Status: AC
  Filled 2022-11-18: qty 20

## 2022-11-18 MED ORDER — LACTATED RINGERS IV SOLN: INTRAVENOUS | Status: DC

## 2022-11-18 MED ORDER — PROPOFOL 1000 MG/100ML IV EMUL
INTRAVENOUS | Status: AC
  Filled 2022-11-18: qty 100

## 2022-11-18 MED ORDER — LACTATED RINGERS IV SOLN: INTRAVENOUS | Status: AC

## 2022-11-18 MED ORDER — BUPIVACAINE HCL (PF) 0.5 % IJ SOLN
INTRAMUSCULAR | Status: AC
  Filled 2022-11-18: qty 30

## 2022-11-18 MED ORDER — AMLODIPINE BESYLATE 5 MG PO TABS
5.0000 mg | ORAL_TABLET | Freq: Every day | ORAL | Status: DC
  Administered 2022-11-19 – 2022-11-20 (×2): 5 mg via ORAL
  Filled 2022-11-18 (×2): qty 1

## 2022-11-18 MED ORDER — PHENYLEPHRINE 80 MCG/ML (10ML) SYRINGE FOR IV PUSH (FOR BLOOD PRESSURE SUPPORT)
PREFILLED_SYRINGE | INTRAVENOUS | Status: DC | PRN
  Administered 2022-11-18: 160 ug via INTRAVENOUS
  Administered 2022-11-18 (×3): 80 ug via INTRAVENOUS

## 2022-11-18 MED ORDER — BUPIVACAINE HCL (PF) 0.5 % IJ SOLN
INTRAMUSCULAR | Status: DC | PRN
  Administered 2022-11-18: 18 mL

## 2022-11-18 MED ORDER — FENTANYL CITRATE (PF) 100 MCG/2ML IJ SOLN
INTRAMUSCULAR | Status: DC | PRN
  Administered 2022-11-18: 50 ug via INTRAVENOUS

## 2022-11-18 MED ORDER — ONDANSETRON HCL 4 MG/2ML IJ SOLN: 4.0000 mg | Freq: Once | INTRAMUSCULAR | Status: DC | PRN

## 2022-11-18 MED ORDER — PROPOFOL 10 MG/ML IV BOLUS
INTRAVENOUS | Status: DC | PRN
Start: 2022-11-18 — End: 2022-11-18
  Administered 2022-11-18: 50 mg via INTRAVENOUS

## 2022-11-18 MED ORDER — RISAQUAD PO CAPS
1.0000 | ORAL_CAPSULE | Freq: Every day | ORAL | Status: DC
  Administered 2022-11-18 – 2022-11-26 (×9): 1 via ORAL
  Filled 2022-11-18 (×9): qty 1

## 2022-11-18 MED ORDER — VASOPRESSIN 20 UNIT/ML IV SOLN
INTRAVENOUS | Status: AC
  Filled 2022-11-18: qty 1

## 2022-11-18 SURGICAL SUPPLY — 47 items
BLADE MED AGGRESSIVE (BLADE) ×1 IMPLANT
BNDG CMPR 5X4 CHSV STRCH STRL (GAUZE/BANDAGES/DRESSINGS) ×1
BNDG CMPR STD VLCR NS LF 5.8X4 (GAUZE/BANDAGES/DRESSINGS) ×1
BNDG COHESIVE 4X5 TAN STRL LF (GAUZE/BANDAGES/DRESSINGS) ×1 IMPLANT
BNDG ELASTIC 4X5.8 VLCR NS LF (GAUZE/BANDAGES/DRESSINGS) ×1 IMPLANT
BNDG ESMARCH 4 X 12 STRL LF (GAUZE/BANDAGES/DRESSINGS) ×1
BNDG ESMARCH 4X12 STRL LF (GAUZE/BANDAGES/DRESSINGS) ×1 IMPLANT
BNDG GAUZE DERMACEA FLUFF 4 (GAUZE/BANDAGES/DRESSINGS) ×1 IMPLANT
BNDG GZE DERMACEA 4 6PLY (GAUZE/BANDAGES/DRESSINGS)
CUFF TOURN SGL QUICK 12 (TOURNIQUET CUFF) IMPLANT
CUFF TOURN SGL QUICK 18X4 (TOURNIQUET CUFF) IMPLANT
DRAIN PENROSE 12X.25 LTX STRL (MISCELLANEOUS) ×1 IMPLANT
DRAPE FLUOR MINI C-ARM 54X84 (DRAPES) ×1 IMPLANT
DRSG GAUZE FLUFF 36X18 (GAUZE/BANDAGES/DRESSINGS) ×1 IMPLANT
DURAPREP 26ML APPLICATOR (WOUND CARE) ×1 IMPLANT
ELECT REM PT RETURN 9FT ADLT (ELECTROSURGICAL) ×1
ELECTRODE REM PT RTRN 9FT ADLT (ELECTROSURGICAL) ×1 IMPLANT
GAUZE SPONGE 4X4 12PLY STRL (GAUZE/BANDAGES/DRESSINGS) ×1 IMPLANT
GAUZE XEROFORM 1X8 LF (GAUZE/BANDAGES/DRESSINGS) ×1 IMPLANT
GLOVE BIO SURGEON STRL SZ7.5 (GLOVE) ×1 IMPLANT
GLOVE INDICATOR 8.0 STRL GRN (GLOVE) ×1 IMPLANT
GOWN STRL REUS W/ TWL LRG LVL3 (GOWN DISPOSABLE) ×2 IMPLANT
GOWN STRL REUS W/TWL LRG LVL3 (GOWN DISPOSABLE) ×2
HANDLE YANKAUER SUCT BULB TIP (MISCELLANEOUS) ×1 IMPLANT
HANDPIECE VERSAJET DEBRIDEMENT (MISCELLANEOUS) ×1 IMPLANT
IV NS IRRIG 3000ML ARTHROMATIC (IV SOLUTION) ×1 IMPLANT
KIT TURNOVER KIT A (KITS) ×1 IMPLANT
LABEL OR SOLS (LABEL) ×1 IMPLANT
MANIFOLD NEPTUNE II (INSTRUMENTS) ×1 IMPLANT
NDL SAFETY ECLIP 18X1.5 (MISCELLANEOUS) ×1 IMPLANT
NS IRRIG 500ML POUR BTL (IV SOLUTION) ×1 IMPLANT
PACK EXTREMITY ARMC (MISCELLANEOUS) ×1 IMPLANT
PAD ABD DERMACEA PRESS 5X9 (GAUZE/BANDAGES/DRESSINGS) ×1 IMPLANT
SOL PREP PVP 2OZ (MISCELLANEOUS) ×1
SOLUTION PREP PVP 2OZ (MISCELLANEOUS) ×1 IMPLANT
SPONGE T-LAP 18X18 ~~LOC~~+RFID (SPONGE) ×1 IMPLANT
STAPLER SKIN PROX 35W (STAPLE) ×1 IMPLANT
STOCKINETTE STRL 6IN 960660 (GAUZE/BANDAGES/DRESSINGS) ×1 IMPLANT
STRAP SAFETY 5IN WIDE (MISCELLANEOUS) ×1 IMPLANT
SUT ETHILON 3-0 FS-10 30 BLK (SUTURE) ×1
SUT VIC AB 2-0 CT1 27 (SUTURE)
SUT VIC AB 2-0 CT1 TAPERPNT 27 (SUTURE) ×2 IMPLANT
SUT VICRYL+ 3-0 36IN CT-1 (SUTURE) ×2 IMPLANT
SUTURE EHLN 3-0 FS-10 30 BLK (SUTURE) IMPLANT
SYR 10ML LL (SYRINGE) ×2 IMPLANT
TRAP FLUID SMOKE EVACUATOR (MISCELLANEOUS) ×1 IMPLANT
WATER STERILE IRR 500ML POUR (IV SOLUTION) ×1 IMPLANT

## 2022-11-18 NOTE — Progress Notes (Signed)
Progress Note    11/18/2022 2:11 PM 3 Days Post-Op  Subjective:   Manuel [name] is a 77 y.o. male with medical history significant of  insulin-dependent DM, HTN, CAD, PAD s/p angioplasty with stent 05/14/2021 presenting to [facility] ER with right lower extremity ischemia and ulcer formation with gangrenous toe.  Patient is now postop day 3 from left common femoral, profunda femoris, and superficial femoral artery endarterectomy and right common femoral, profunda femoris and superficial femoral artery endarterectomy.  Patient is also noted to have kissing expandable stents placed in bilateral common iliac arteries as well as stent placement x 2 to the left external iliac artery.  On exam this morning patient is resting comfortably in bed.  Bilateral lower extremity Doppler pulses are good.  Both feet are warm to touch.  Both lower extremities are neuro intact has patient can wiggle toes.  Strength is 5 out of 5 in lower extremities.  Patient does endorse tenderness to bilateral groins with Prevena wound vacs in place.  Patient was instructed he needs to ambulate with physical therapy today is much as possible and sit in the bedside chair for as long as he can tolerate.   Vitals:   11/18/22 0906 11/18/22 1308  BP: (!) 154/85 (!) 143/85  Pulse: 74 77  Resp: 19 19  Temp: (!) 97.5 F (36.4 C) 98.3 F (36.8 C)  SpO2: 100% 100%   Physical Exam: Cardiac:  RRR, normal S1, S2 no murmurs present. Lungs: On auscultation scattered bilateral rales.  No rhonchi or wheezing noted. Incisions: Bilateral groin incisions with Prevena wound vacs in place.  Both working well Extremities: Bilateral lower extremities with positive Doppler pulses.  Both DP and PT. Abdomen: Positive bowel sounds throughout, soft, nontender and nondistended. Neurologic: Alert and oriented x 3, follows all commands and answers all questions appropriately.  CBC    Component Value Date/Time   WBC 10.6 (H) 11/18/2022 0429   RBC  3.34 (L) 11/18/2022 0429   HGB 10.1 (L) 11/18/2022 0429   HGB 15.7 02/16/2012 2013   HCT 31.0 (L) 11/18/2022 0429   HCT 44.4 02/16/2012 2013   PLT 269 11/18/2022 0429   PLT 176 02/16/2012 2013   MCV 92.8 11/18/2022 0429   MCV 91 02/16/2012 2013   MCH 30.2 11/18/2022 0429   MCHC 32.6 11/18/2022 0429   RDW 13.6 11/18/2022 0429   RDW 13.0 02/16/2012 2013   LYMPHSABS 1.8 11/10/2022 0954   MONOABS 1.1 (H) 11/10/2022 0954   EOSABS 0.2 11/10/2022 0954   BASOSABS 0.1 11/10/2022 0954    BMET    Component Value Date/Time   NA 131 (L) 11/18/2022 0429   NA 137 02/16/2012 2013   K 4.2 11/18/2022 0429   K 3.8 02/16/2012 2013   CL 99 11/18/2022 0429   CL 102 02/16/2012 2013   CO2 21 (L) 11/18/2022 0429   CO2 25 02/16/2012 2013   GLUCOSE 143 (H) 11/18/2022 0429   GLUCOSE 154 (H) 02/16/2012 2013   BUN 33 (H) 11/18/2022 0429   BUN 10 02/16/2012 2013   CREATININE 1.83 (H) 11/18/2022 0429   CREATININE 0.72 02/16/2012 2013   CALCIUM 8.1 (L) 11/18/2022 0429   CALCIUM 9.6 02/16/2012 2013   GFRNONAA 37 (L) 11/18/2022 0429   GFRNONAA >60 02/16/2012 2013   GFRAA >60 12/09/2018 0929   GFRAA >60 02/16/2012 2013    INR    Component Value Date/Time   INR 1.1 11/10/2022 1124     Intake/Output Summary (Last 24  hours) at 11/18/2022 1411 Last data filed at 11/18/2022 1135 Gross per 24 hour  Intake --  Output 450 ml  Net -450 ml     Assessment/Plan:  78 y.o. male is s/p bilateral femoral endarterectomies with bilateral iliac stent placement.  3 Days Post-Op patient is also postop day   PLAN: Patient to remain on heparin infusion as podiatry will take him for surgery Monday evening for amputation of toes.  Patient to be NPO.  Patient verbalizes understanding. Continue with PT and OT Pain medication as needed.  DVT prophylaxis: Heparin infusion.   Marcie [facility] 11/18/2022 2:11 PM

## 2022-11-18 NOTE — Anesthesia Preprocedure Evaluation (Signed)
Anesthesia Evaluation  Patient identified by MRN, date of birth, ID band Patient awake    Reviewed: Allergy & Precautions, H&P , NPO status , Patient's Chart, lab work & pertinent test results  History of Anesthesia Complications Negative for: history of anesthetic complications  Airway Mallampati: II  TM Distance: >3 FB Neck ROM: full    Dental  (+) Edentulous Upper, Edentulous Lower, Dental Advidsory Given   Pulmonary neg shortness of breath, sleep apnea , neg COPD, neg recent URI, Patient abstained from smoking.   Pulmonary exam normal        Cardiovascular Exercise Tolerance: Poor hypertension, (-) angina + CAD, + Past MI, + Cardiac Stents, + Peripheral Vascular Disease and +CHF  Normal cardiovascular exam(-) dysrhythmias (-) Valvular Problems/Murmurs  Heart failure exacerbation 7/23 after angiogram with oxygen requirement and need for diuretics. Now improved, off oxygen. Cardiology evaluated and cleared   ECHO 11/14/22:  1. Left ventricular ejection fraction, by estimation, is 25 to 30%. The  left ventricle has severely decreased function. The left ventricle  demonstrates regional wall motion abnormalities (see scoring  diagram/findings for description). Left ventricular  diastolic parameters were normal.   2. Right ventricular systolic function is normal. The right ventricular  size is normal.   3. The mitral valve is normal in structure. Mild to moderate mitral valve  regurgitation. No evidence of mitral stenosis.   4. Tricuspid valve regurgitation is mild to moderate.   5. The aortic valve is normal in structure. Aortic valve regurgitation is  not visualized. No aortic stenosis is present.   6. The inferior vena cava is normal in size with greater than 50%  respiratory variability, suggesting right atrial pressure of 3 mmHg.    Bilateral carotid artery stenosis  BNP elevated to 932 7/24  EKG 7/21: Nonspecific  interventricular conduction delay  MPS 10/2018: LVEF= 45%  FINDINGS:  Regional wall motion:  demonstrates  hypokinesis of the inferior wall.  The overall quality of the study is fair.   Artifacts noted: no  Left ventricular cavity: normal.     Neuro/Psych  PSYCHIATRIC DISORDERS (cleared by psychiatry for recent suggestions of suicidal ideation)       Neuromuscular disease (diabetic neuropathy)    GI/Hepatic negative GI ROS,,,(+)     substance abuse  alcohol use and marijuana use  Endo/Other  diabetes, Well Controlled, Type 2, Insulin Dependent    Renal/GU      Musculoskeletal  (+) Arthritis ,    Abdominal Normal abdominal exam  (+)   Peds  Hematology negative hematology ROS (+)   Anesthesia Other Findings Per cardiology evaluation 7/25: Manuel [name] is a 4yoM with a PMH of CAD s/p PCI mRCA (3 overlapping Xience stents 2011), extensive PAD s/p PTA L common Iliac, R peroneal, R distal SFA/prox popliteal, and stent to R distal SFA/prox popliteal arteries (05/14/21), 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 Revascularization with Dr. Wyn Quaker was planned on 7/24 but cancelled 2/2 dyspnea and heart failure symptoms. Cardiology is consulted for assistance with the patients heart failure.    # Severe PAD with lower extremity ulceration -Agree with current therapy per primary and vascular surgery -On broad-spectrum Unasyn   # Acute on chronic HFmrEF Developed dyspnea/orthopnea on 7/23 and was given IV Lasix with improvement, vascular surgery intervention canceled for 7/24 due to his heart failure exacerbation. Repeat chest x-ray with trace bilateral pleural effusions,  BNP was elevated in the 900s.  Continued IV Lasix with clinical improvement, now on room air without orthopnea or shortness of breath while ambulatory in the room. -Increase Lasix to 60 mg twice daily x 2  doses today, likely transition to p.o. Lasix tomorrow -GDMT with Coreg 12.5 mg twice daily, change ACE to losartan 25 mg daily, add spironolactone 12.5 mg daily.  SGLT2i likely outpatient pending recovery from infection as above -Stop home amlodipine 10 mg daily -Echo complete performed this morning, pending read -The patient is at a moderate acceptable risk to proceed with lower extremity revascularization tomorrow, echocardiogram pending and GDMT additions as above.  He is relatively euvolemic on exam with only trace crackles after diuresis yesterday, anticipate he will be optimized from a HF perspective tomorrow morning to proceed with vascular surgery as planned.     Past Medical History: No date: Allergies No date: Arthritis No date: Diabetes mellitus without complication (HCC) No date: Gout No date: Hard of hearing No date: Hyperlipidemia No date: Hypertension 2008: Myocardial infarction [facility])     Comment:  treated here at [facility] and transferred to [place] for stent              placement No date: Neuropathy     Comment:  knees down No date: Peripheral vascular disease (HCC) No date: Sleep apnea     Comment:  "in the past" No date: Status post primary angioplasty with coronary stent No date: Wears dentures     Comment:  full upper and lower  Past Surgical History: 03/14/2021: CATARACT EXTRACTION W/PHACO; Right     Comment:  Procedure: CATARACT EXTRACTION PHACO AND INTRAOCULAR               LENS PLACEMENT (IOC) RIGHT DIABETIC;  Surgeon:               Lockie Mola, MD;  Location: [facility];              Service: Ophthalmology;  Laterality: Right;                Diabetic 16.78 01:39.9 03/28/2021: CATARACT EXTRACTION W/PHACO; Left     Comment:  Procedure: CATARACT EXTRACTION PHACO AND INTRAOCULAR               LENS PLACEMENT (IOC) LEFT DIABETIC 6.93 01:22.0;                Surgeon: Lockie Mola, MD;  Location: [facility];   Service: Ophthalmology;  Laterality: Left;              Diabetic 05/14/2021: LOWER EXTREMITY ANGIOGRAPHY; Right     Comment:  Procedure: LOWER EXTREMITY ANGIOGRAPHY;  Surgeon: Annice Needy, MD;  Location: [facility] IN[facility];  Service:               Cardiovascular;  Laterality: Right; 06/14/2021: LOWER EXTREMITY ANGIOGRAPHY; Right     Comment:  Procedure: Lower Extremity Angiography;  Surgeon: Annice Needy, MD;  Location: [facility] IN[facility] CV [facility];  Service:               Cardiovascular;  Laterality: Right; 11/11/2022: LOWER EXTREMITY ANGIOGRAPHY; Right     Comment:  Procedure: Lower Extremity Angiography;  Surgeon: Wyn Quaker,  Marlow Baars, MD;  Location: [facility] INVAS[facility] CV [facility];  Service:               Cardiovascular;  Laterality: Right; 06/15/2021: LOWER EXTREMITY INTERVENTION; Right     Comment:  Procedure: LOWER EXTREMITY INTERVENTION;  Surgeon: Annice Needy, MD;  Location: [facility] INVAS[facility] CV [facility];  Service:               Cardiovascular;  Laterality: Right; No date: TONSILLECTOMY     Comment:  had removed as a child  BMI    Body Mass Index: 29.06 kg/m      Reproductive/Obstetrics negative OB ROS                             Anesthesia Physical Anesthesia Plan  ASA: 4  Anesthesia Plan: General   Post-op Pain Management: Tylenol PO (pre-op)*   Induction: Intravenous  PONV Risk Score and Plan: 2 and Propofol infusion and TIVA  Airway Management Planned: Natural Airway and Simple Face Mask  Additional Equipment:   Intra-op Plan:   Post-operative Plan:   Informed Consent: I have reviewed the patients History and Physical, chart, labs and discussed the procedure including the risks, benefits and alternatives for the proposed anesthesia with the patient or authorized representative who has indicated his/her understanding and acceptance.   Patient has DNR.  Discussed DNR with patient and Suspend DNR.   Dental  Advisory Given  Plan Discussed with: CRNA and Surgeon  Anesthesia Plan Comments:         Anesthesia Quick Evaluation

## 2022-11-18 NOTE — TOC Progression Note (Addendum)
Transition of Care [facility]) - Progression Note    Patient Details  Name: Manuel [name] MRN: [id number] Date of Birth: 02/26/1946  [facility] Care [facility]) CM/SW Contact  Darolyn Rua, [place] Phone Number: 11/18/2022, 12:12 PM  Clinical Narrative:     7/29: Patient to go for Toe Amputation surgery today  7/27 and 7/28 patient declined to work with therapy   7/26: Patient had vascular procedure with stent placements and significant blood loss.   7/25: Patient states that he lives at home. Does not have any DME. Patient is aware that therapy will see post op and TOC will follow for dc needs    7/24: Per chart review patient was admitted from home with wounds to feet, swollen  lower extremities and hx of diabetes. Patient pharmacy is [facility].  Admitted from: home with wife  PCP: Letitia Libra   SA resources added to AVS on 7/23 by previous CM    Expected Discharge Plan: Home w Home Health Services    Expected Discharge Plan and Services                                               Social Determinants of Health (SDOH) Interventions SDOH Screenings   Food Insecurity: No Food Insecurity (11/10/2022)  Housing: Low Risk  (11/10/2022)  Transportation Needs: No Transportation Needs (11/10/2022)  Utilities: Not At Risk (11/10/2022)  Tobacco Use: Low Risk  (11/15/2022)  Recent Concern: Tobacco Use - Medium Risk (09/09/2022)   Received from [facility], [facility]    Readmission Risk Interventions     No data to display

## 2022-11-18 NOTE — Consult Note (Signed)
ANTICOAGULATION CONSULT NOTE  Pharmacy Consult for Heparin Infusion Indication: chest pain/ACS  No Known Allergies  Patient Measurements: Height: 5\' 8"  (172.7 cm) Weight: 109 kg (240 lb 4.8 oz) IBW/kg (Calculated) : 68.4 Heparin Dosing Weight: 85.7 kg  Vital Signs: Temp: 97.6 F (36.4 C) (07/29 0330) Temp Source: Oral (07/29 0330) BP: 133/78 (07/29 0330) Pulse Rate: 72 (07/29 0330)  Labs: Recent Labs    11/16/22 0614 11/16/22 0747 11/17/22 0526 11/18/22 0429  HGB 11.5*  --  9.4* 10.1*  HCT 35.0*  --  27.8* 31.0*  PLT 298  --  230 269  HEPARINUNFRC  --  0.39 0.60 0.56  CREATININE 0.85  --  1.31* 1.83*    Estimated Creatinine Clearance: 39.2 mL/min (A) (by C-G formula based on SCr of 1.83 mg/dL (H)).   Medical History: Past Medical History:  Diagnosis Date   Allergies    Arthritis    Diabetes mellitus without complication (HCC)    Gout    Hard of hearing    Hyperlipidemia    Hypertension    Myocardial infarction [facility]) 2008   treated here at [facility] and transferred to [place] for stent placement   Neuropathy    knees down   Peripheral vascular disease (HCC)    Sleep apnea    "in the past"   Status post primary angioplasty with coronary stent    Wears dentures    full upper and lower    Assessment: Manuel [name] is a 77 y.o. male with past medical history of PVD s/p mechanical thrombectomy of right lower extremity in February 2023, CAD, T2DM, HLD, neuropathy. He was on a DOAC previously, but is not on anticoagulant therapy currently. He is here with pain to the right foot and a black toe on the right foot starting approximately 3 days ago. Vascular surgery consulted and planning angiography 7/22. Pharmacy has been consulted to initiate and manage heparin infusion.   7/25 1446 HL 0.47 7/26 0506 HL 0.60, therapeutic x 2 7/27 0747 HL 0.39 7/29 0429 HL 0.56   Goal of Therapy:  Heparin level 0.3-0.7 units/ml Monitor platelets by anticoagulation protocol:  Yes  Plan:  Heparin remains therapeutic Continue heparin infusion at 2250 units/hr  F/U stoppage 2 hr prior to procedure (no time scheduled) Heparin level with AM labs if heparin not stopped Recheck heparin level and CBC daily with AM labs.   Otelia Sergeant, PharmD, [facility] 11/18/2022 5:53 AM

## 2022-11-18 NOTE — Progress Notes (Signed)
Progress Note   Patient: Manuel [name] UJW:[id number] DOB: 1946-02-08 DOA: 11/10/2022     8 DOS: the patient was seen and examined on 11/18/2022   Brief hospital course: Taken from H&P.   Manuel [name] is a 77 y.o. male with medical history significant of  insulin-dependent DM, HTN, CAD, PAD s/p angioplasty with stent 05/14/2021 presenting with right lower extremity ischemia and ulcer formation with gangrenous toe.  Presented to the ER afebrile, hemodynamically stable. White count 11.2, hemoglobin 15.2, platelets 303, lactate 1.2, creatinine 0.9. T. bili 1.5. Plain films of the right foot with no signs of osteomyelitis. Dr. [name] with vascular surgery initially consulted with recommendation for heparin drip. Dr. [name] with podiatry also consulted for further evaluation.   7/22: Vital stable.  Going for lower extremity angiography and possible angioplasty with vascular today.  CRP elevated at 10.1, decreased prealbumin at 15 and normal ESR.  Preliminary blood cultures negative.  Continue current broad-spectrum antibiotics.  Found to have very extensive disease and most likely will required bilateral femoral endarterectomies and bilateral iliac stents.  Vascular is looking for a OR time for this extensive procedure which will require 4 to 5 hours.  7/23: Vital and labs stable.  Overnight some shortness of breath and crackles.  One-time 40 mg of IV Lasix was given.  Chest x-ray ordered but patient refused. Patient had a choking spell on meds this morning, continued to feel something sticking in his chest.  No hypoxia.  Chest x-ray with diffuse interstitial prominence and areas of nodularity throughout the both lungs.  Differential include atypical inflammatory process versus atypical pulmonary edema.  Ordered 1 dose of IV Lasix Going for his extensive vascular procedure tomorrow.  7/24: Mildly elevated blood pressure.  BNP elevated at 932, surgery got canceled for concern of acute on chronic  heart failure and patient now need cardiology clearance before proceeding. Family concern of suicidal thoughts, psych was consulted.  7/25: Hemodynamically stable.  Will echocardiogram with EF of 25 to 30% with some regional wall motion abnormalities.  Likely weak going for vascular surgery tomorrow, pending final recommendations from cardiology. Psychiatry with no suicidal thoughts.  7/26: Hemodynamically stable.  Going for vascular procedure today.  7/27: Vital stable.  Patient underwent extensive vascular procedure which involved bilateral lower extremity endarterectomies and multiple stents placement.  Pulses present with Doppler this morning.  Significant blood loss during the procedure, hemoglobin decreased to 11.5 this morning, baseline of 13.  Started on supplement.  7/28: Vital stable.  Labs with increase in creatinine to 1.31-holding Lasix today.  Hemoglobin further decreased to 9.4.  Likely be going for toe amputation tomorrow.  Arterial line removed.  7/29: Going for toes removal today.  Slight worsening of renal function today, holding diuretic and giving some IV fluid as patient is n.p.o.  Assessment and Plan: * Ischemic ulcer of lower extremity (HCC) Worsening right lower extremity redness swelling and pain over the past 2 weeks in the setting of baseline limb ischemia status post angioplasty and stenting January 2023.Noted worsening ulcer formation across multiple digits of the foot with concern for gangrene Patient reports compliance with Plavix and statin therapy Started on heparin drip in the ER  Vascular procedure with extensive peripheral vascular disease, second procedure for bilateral endarterectomies and bilateral iliac stent placement with vascular surgery was done yesterday, tolerated the procedure well.  Palpable pulses today. -Broad-spectrum antibiotics switched with Augmentin -Going for fourth and fifth toe amputation today    Diabetic polyneuropathy associated  with type 2 diabetes mellitus (HCC) Cont neurontin    Coronary artery disease with history of myocardial infarction without history of CABG Followed by Dr. Gwen [name] outpt  CAD s/p PCI to [facility] with overlapping 3 mm Xience stents of 23 mm and 12 mm length in 2011  No active chest pain On plavix monotherapy and statin chronically  On heparin drip in the setting of right lower extremity ischemia Follow  Hypertension Patient required nitro GGT after the procedure which was now being tapered off Titrate home regimen  Arterial line removed today   Diabetes mellitus type 2, insulin dependent (HCC) SSI   Alcohol abuse 3-4 beer daily drinker + mixed drinks  Will place on CIWA protocol  Follow   Mixed hyperlipidemia Continue statin  Adjustment disorder with depressed mood Family was concerned about some suicidal thoughts so psychiatry was consulted. They cleared him today as patient denies any suicidal thoughts, stating that he was just frustrated  Acute on chronic combined systolic and diastolic CHF (congestive heart failure) (HCC) According to a prior echo done in 2020, EF of 40 to 45%. Patient with concern of shortness of breath, mild hypoxia and chest x-ray with bilateral opacities.  BNP elevated at 932. Repeat echocardiogram with further decrease of EF to 20 to 25% with regional wall motion abnormalities. Cardiology is on board- -Keep holding diuretic as slight worsening of renal function -Daily weight and BMP -Strict intake and output   Subjective: Patient was seen and examined today.  No new concern.  Waiting for toe amputations.  Physical Exam: Vitals:   11/17/22 2348 11/18/22 0323 11/18/22 0330 11/18/22 0906  BP: (!) 109/59  133/78 (!) 154/85  Pulse: 70  72 74  Resp: 17  18 19   Temp: 98.4 F (36.9 C)  97.6 F (36.4 C) (!) 97.5 F (36.4 C)  TempSrc: Oral  Oral   SpO2: 99%  99% 100%  Weight:  109 kg    Height:       General.  Frail elderly man, in no acute  distress. Pulmonary.  Lungs clear bilaterally, normal respiratory effort. CV.  Regular rate and rhythm, no JVD, rub or murmur. Abdomen.  Soft, nontender, nondistended, BS positive. CNS.  Alert and oriented .  No focal neurologic deficit. Extremities.  No edema, no cyanosis, palpable pulses although weak. Psychiatry.  Judgment and insight appears normal.   Data Reviewed: Prior data reviewed  Family Communication: Wife at bedside  Disposition: Status is: Inpatient Remains inpatient appropriate because: Severity of illness  Planned Discharge Destination:  To be determined  Time spent: 43 minutes  This record has been created using Conservation officer, historic buildings. Errors have been sought and corrected,but may not always be located. Such creation errors do not reflect on the standard of care.   Author: Arnetha Courser, MD 11/18/2022 12:53 PM  For on call review [email or web address].

## 2022-11-18 NOTE — Plan of Care (Signed)

## 2022-11-18 NOTE — Transfer of Care (Signed)
Immediate Anesthesia Transfer of Care Note  Patient: Manuel [name]  Procedure(s) Performed: AMPUTATION 4TH AND 5TH RAY (Right: Foot)  Patient Location: PACU  Anesthesia Type:General  Level of Consciousness: drowsy  Airway & Oxygen Therapy: Patient Spontanous Breathing and Patient connected to face mask oxygen  Post-op Assessment: Report given to RN, Post -op Vital signs reviewed and stable, and Patient moving all extremities  Post vital signs: Reviewed and stable  Last Vitals:  Vitals Value Taken Time  BP 116/66 11/18/22 2107  Temp    Pulse 65 11/18/22 2110  Resp 14 11/18/22 2110  SpO2 98 % 11/18/22 2110  Vitals shown include unfiled device data.  Last Pain:  Vitals:   11/18/22 1630  TempSrc: Oral  PainSc:       Patients Stated Pain Goal: 3 (11/17/22 0800)  Complications: No notable events documented.

## 2022-11-18 NOTE — Op Note (Signed)
Date of operation: 11/18/2022.  Surgeon: Ricci Barker D.P.M.  Preoperative diagnosis: Gangrene right fourth and fifth toes.  Postoperative diagnosis: Same.  Procedure: Amputation right fourth and fifth toes with partial ray resections.  Anesthesia: Local MAC.  Hemostasis: None.  Estimated blood loss: 20 cc.  Pathology: Right fourth and fifth toes with partial ray.  Complications: None apparent.  Operative indications: This is a 77 year old male with history of severe peripheral vascular disease with recent development of gangrenous changes to his right fourth and fifth toes.  During the hospital stay underwent extensive revascularization procedure at which point he was stabilized for definitive treatment for the gangrenous toes on the right foot.  Operative procedure: Patient was taken to the operating room and placed on the table in the supine position.  Following satisfactory sedation the right foot was anesthetized with 18 cc of 0.5% Marcaine plain around the lateral aspect of the right forefoot.  The foot was then prepped and draped in the usual sterile fashion.  Attention was directed to the fourth and fifth toes where a racquet type incision was made extending from the third interspace between the third and fourth toes across the dorsal aspect of the forefoot just proximal to the fourth and fifth toes extending along the lateral aspect of the fifth metatarsal.  Similar incision was made plantarly.  Incision was carried full-thickness down to the level of the joints where the soft tissue was freed from the surrounding anatomy at the joints and metatarsal necks.  Using a sagittal saw the fifth and fourth metatarsal necks were incised and the fourth and fifth toes with partial metatarsal attachments were then removed in toto.  The wound was flushed using copious amounts of sterile saline.  The incision was then closed using 3-0 nylon vertical mattress, simple interrupted, and figure-of-eight  sutures.  The foot was then washed and Xeroform applied to the incision followed by 4 x 4's ABD and Kerlix.  Drapes taken down and then a second ABD and Kerlix as well as Ace wrap applied.  Patient was awakened and transported to the PACU having tolerated the anesthesia and procedure well.

## 2022-11-18 NOTE — Progress Notes (Signed)
Nutrition Follow-up  DOCUMENTATION CODES:   Not applicable  INTERVENTION:   -Continue MVI with minerals daily -Continue Ensure Max po BID, each supplement provides 150 kcal and 30 grams of protein.   -Continue 500 mg vitamin BID  NUTRITION DIAGNOSIS:   Increased nutrient needs related to wound healing as evidenced by estimated needs.  Ongoing  GOAL:   Patient will meet greater than or equal to 90% of their needs  Progressing   MONITOR:   PO intake, Supplement acceptance, Labs, Weight trends, I & O's, Skin  REASON FOR ASSESSMENT:   Consult Wound healing  ASSESSMENT:   77 y.o. male with medical history significant of  insulin-dependent DM, HTN, CAD, etoh abuse, depression, OSA, MI, gout and PAD s/p angioplasty with stent 05/14/2021 who is admitted with right lower extremity ischemia and ulcer formation with gangrenous toe.  7/26- s/p Procedure(s): ENDARTERECTOMY FEMORAL (POSSIBLE BILATERAL FEM-ENDART, RIGHT POPLITEAL AND TIBIAL INTERVENTION) (Right) INSERTION OF ILIAC STENT (Bilateral) APPLICATION OF CELL SAVER (Bilateral)  Reviewed I/O's: -294 ml x 24 hours and -193 ml since admission  UOP: 550 ml x 24 hours   Pt NPO for toe amputations today.   Pt unavailable at time of visit.   Pt with good appetite. Noted meal completions 70-90%. Pt has been drinking Ensure Max supplements.  Medications reviewed and include vitamin C, calcium carbonate, colace, and thiamine.   Lab Results  Component Value Date   HGBA1C 6.9 (H) 11/10/2022   PTA DM medications are 32 units insulin aspart protamine-aspart BID.   Labs reviewed: Na: 131, CBGS: 138-141 (inpatient orders for glycemic control are 0-9 units insulin aspart TID with meals).    Diet Order:   Diet Order             Diet NPO time specified  Diet effective 1000                   EDUCATION NEEDS:   Not appropriate for education at this time  Skin:  Skin Assessment: Skin Integrity Issues: Skin Integrity  Issues:: Wound VAC, Incisions, Diabetic Ulcer Wound Vac: rt groin Diabetic Ulcer: rt foot, necrotic rt 4-th digits Incisions: lt groin  Last BM:  7/21  Height:   Ht Readings from Last 1 Encounters:  11/15/22 5\' 8"  (1.727 m)    Weight:   Wt Readings from Last 1 Encounters:  11/18/22 109 kg    Ideal Body Weight:  70 kg  BMI:  Body mass index is 36.54 kg/m.  Estimated Nutritional Needs:   Kcal:  2100-2300  Protein:  105-120 grams  Fluid:  > 2 L    Levada Schilling, RD, LDN, CDCES Registered Dietitian II Certified Diabetes Care and Education Specialist Please refer to [facility] for RD and/or RD on-call/weekend/after hours pager

## 2022-11-18 NOTE — Interval H&P Note (Signed)
History and Physical Interval Note:  11/18/2022 7:56 PM  [name]  has presented today for surgery, with the diagnosis of Osteomyelitis.  The various methods of treatment have been discussed with the patient and family. After consideration of risks, benefits and other options for treatment, the patient has consented to  Procedure(s): AMPUTATION 4TH AND 5TH RAY (Right) as a surgical intervention.  The patient's history has been reviewed, patient examined, no change in status, stable for surgery.  I have reviewed the patient's chart and labs.  Questions were answered to the patient's satisfaction.     Manuel [name]

## 2022-11-19 ENCOUNTER — Encounter: Payer: Self-pay | Admitting: Podiatry

## 2022-11-19 ENCOUNTER — Other Ambulatory Visit: Payer: Self-pay

## 2022-11-19 DIAGNOSIS — I998 Other disorder of circulatory system: Secondary | ICD-10-CM | POA: Diagnosis not present

## 2022-11-19 DIAGNOSIS — I2510 Atherosclerotic heart disease of native coronary artery without angina pectoris: Secondary | ICD-10-CM | POA: Diagnosis not present

## 2022-11-19 DIAGNOSIS — L97909 Non-pressure chronic ulcer of unspecified part of unspecified lower leg with unspecified severity: Secondary | ICD-10-CM | POA: Diagnosis not present

## 2022-11-19 DIAGNOSIS — E1142 Type 2 diabetes mellitus with diabetic polyneuropathy: Secondary | ICD-10-CM | POA: Diagnosis not present

## 2022-11-19 LAB — BASIC METABOLIC PANEL
Anion gap: 11 (ref 5–15)
BUN: 43 mg/dL — ABNORMAL HIGH (ref 8–23)
CO2: 22 mmol/L (ref 22–32)
Calcium: 7.9 mg/dL — ABNORMAL LOW (ref 8.9–10.3)
Chloride: 98 mmol/L (ref 98–111)
Creatinine, Ser: 1.67 mg/dL — ABNORMAL HIGH (ref 0.61–1.24)
GFR, Estimated: 41 mL/min — ABNORMAL LOW (ref >60)
Glucose, Bld: 133 mg/dL — ABNORMAL HIGH (ref 70–99)
Potassium: 4.1 mmol/L (ref 3.5–5.1)
Sodium: 131 mmol/L — ABNORMAL LOW (ref 135–145)

## 2022-11-19 LAB — CBC
HCT: 23 % — ABNORMAL LOW (ref 39.0–52.0)
Hemoglobin: 7.8 g/dL — ABNORMAL LOW (ref 13.0–17.0)
MCH: 30.5 pg (ref 26.0–34.0)
MCHC: 33.9 g/dL (ref 30.0–36.0)
MCV: 89.8 fL (ref 80.0–100.0)
Platelets: 273 10*3/uL (ref 150–400)
RBC: 2.56 MIL/uL — ABNORMAL LOW (ref 4.22–5.81)
RDW: 13.3 % (ref 11.5–15.5)
WBC: 8.2 10*3/uL (ref 4.0–10.5)
nRBC: 0 % (ref 0.0–0.2)

## 2022-11-19 LAB — GLUCOSE, CAPILLARY
Glucose-Capillary: 133 mg/dL — ABNORMAL HIGH (ref 70–99)
Glucose-Capillary: 145 mg/dL — ABNORMAL HIGH (ref 70–99)
Glucose-Capillary: 121 mg/dL — ABNORMAL HIGH (ref 70–99)
Glucose-Capillary: 144 mg/dL — ABNORMAL HIGH (ref 70–99)

## 2022-11-19 MED ORDER — CLOPIDOGREL BISULFATE 75 MG PO TABS
75.0000 mg | ORAL_TABLET | Freq: Every day | ORAL | Status: DC
  Administered 2022-11-19 – 2022-11-26 (×8): 75 mg via ORAL
  Filled 2022-11-19 (×8): qty 1

## 2022-11-19 MED ORDER — ASPIRIN 81 MG PO TBEC
81.0000 mg | DELAYED_RELEASE_TABLET | Freq: Every day | ORAL | Status: DC
  Administered 2022-11-19 – 2022-11-26 (×8): 81 mg via ORAL
  Filled 2022-11-19 (×8): qty 1

## 2022-11-19 NOTE — Progress Notes (Signed)
1 Day Post-Op   Subjective/Chief Complaint: Patient seen.  No pain with his foot following surgery.  Still some pain in the legs.   Objective: Vital signs in last 24 hours: Temp:  [97.5 F (36.4 C)-98.5 F (36.9 C)] 98.2 F (36.8 C) (07/30 0749) Pulse Rate:  [61-77] 68 (07/30 0749) Resp:  [11-20] 20 (07/30 0749) BP: (85-145)/(52-86) 122/59 (07/30 0749) SpO2:  [87 %-100 %] 99 % (07/30 0749) Weight:  [109.6 kg] 109.6 kg (07/30 0409) Last BM Date : 11/14/22 (per pt report)  Intake/Output from previous day: 07/29 0701 - 07/30 0700 In: 1469.7 [P.O.:480; I.V.:389.5; IV Piggyback:600.3] Out: 215 [Urine:200; Blood:15] Intake/Output this shift: Total I/O In: -  Out: 300 [Urine:300]  Bandage on the left foot is dry and intact.  Upon removal there is only mild bleeding on the bandage.  Upon removal the incision is well coapted with the skin edges viable.  No sign of infection.  Lab Results:  Recent Labs    11/18/22 0429 11/19/22 0658  WBC 10.6* 8.2  HGB 10.1* 7.8*  HCT 31.0* 23.0*  PLT 269 273   BMET Recent Labs    11/18/22 0429 11/19/22 0658  NA 131* 131*  K 4.2 4.1  CL 99 98  CO2 21* 22  GLUCOSE 143* 133*  BUN 33* 43*  CREATININE 1.83* 1.67*  CALCIUM 8.1* 7.9*   PT/INR No results for input(s): "LABPROT", "INR" in the last 72 hours. ABG No results for input(s): "PHART", "HCO3" in the last 72 hours.  Invalid input(s): "PCO2", "PO2"  Studies/Results: No results found.  Anti-infectives: Anti-infectives (From admission, onward)    Start     Dose/Rate Route Frequency Ordered Stop   11/15/22 1915  ceFAZolin (ANCEF) IVPB 2g/100 mL premix        2 g 200 mL/hr over 30 Minutes Intravenous Every 8 hours 11/15/22 1820 11/16/22 0534   11/13/22 1800  Ampicillin-Sulbactam (UNASYN) 3 g in sodium chloride 0.9 % 100 mL IVPB        3 g 200 mL/hr over 30 Minutes Intravenous Every 6 hours 11/13/22 1432     11/13/22 0636  ceFAZolin (ANCEF) IVPB 2g/100 mL premix  Status:   Discontinued        2 g 200 mL/hr over 30 Minutes Intravenous 30 min pre-op 11/13/22 0636 11/13/22 0814   11/11/22 1338  ceFAZolin (ANCEF) IVPB 2g/100 mL premix  Status:  Discontinued        2 g 200 mL/hr over 30 Minutes Intravenous 30 min pre-op 11/11/22 1338 11/11/22 1608   11/11/22 1200  vancomycin (VANCOREADY) IVPB 1750 mg/350 mL  Status:  Discontinued        1,750 mg 175 mL/hr over 120 Minutes Intravenous Every 24 hours 11/10/22 1142 11/13/22 1432   11/10/22 1400  ceFEPIme (MAXIPIME) 2 g in sodium chloride 0.9 % 100 mL IVPB  Status:  Discontinued        2 g 200 mL/hr over 30 Minutes Intravenous Every 8 hours 11/10/22 1129 11/13/22 1432   11/10/22 1130  metroNIDAZOLE (FLAGYL) IVPB 500 mg  Status:  Discontinued        500 mg 100 mL/hr over 60 Minutes Intravenous Every 12 hours 11/10/22 1119 11/13/22 1432   11/10/22 1030  vancomycin (VANCOREADY) IVPB 2000 mg/400 mL        2,000 mg 200 mL/hr over 120 Minutes Intravenous  Once 11/10/22 1027 11/10/22 1331   11/10/22 1000  piperacillin-tazobactam (ZOSYN) IVPB 3.375 g  3.375 g 100 mL/hr over 30 Minutes Intravenous  Once 11/10/22 0949 11/10/22 1054       Assessment/Plan: s/p Procedure(s) with comments: AMPUTATION 4TH AND 5TH RAY (Right) - 4th and 5th toe Assessment: Stable status post fourth and fifth ray resection right foot  Plan: Betadine applied to the incision followed by a sterile gauze dressing with Kerlix and Ace.  Discussed with the patient that he may be partial weightbearing in his OrthoWedge surgical shoe using crutches or walker with pressure only on the heel of the shoe.  Minimal weightbearing for the first couple of weeks.  I will follow-up with the patient for evaluation of the right foot next week outpatient.  Patient stable for discharge when medically appropriate from a podiatry standpoint.  LOS: 9 days    Manuel [name] 11/19/2022

## 2022-11-19 NOTE — Progress Notes (Signed)
Progress Note    11/19/2022 12:59 PM 1 Day Post-Op  Subjective:  Manuel [name] is a 77 y.o. male with medical history significant of  insulin-dependent DM, HTN, CAD, PAD s/p angioplasty with stent 05/14/2021 presenting to [facility] ER with right lower extremity ischemia and ulcer formation with gangrenous toe.  Patient is now postop day 3 from left common femoral, profunda femoris, and superficial femoral artery endarterectomy and right common femoral, profunda femoris and superficial femoral artery endarterectomy.  Patient is also noted to have kissing expandable stents placed in bilateral common iliac arteries as well as stent placement x 2 to the left external iliac artery. Patient is now POD#1 from right lower extremity 4th and 5th toe amputation.   Resting comfortably this morning. Recovering as expected.Bandage to lower right extremity. I did not remove it this morning but no noted drainage on the dressing. No complaints overnight and vitals all remain stable.    Vitals:   11/19/22 0409 11/19/22 0749  BP: 117/63 (!) 122/59  Pulse: 66 68  Resp: 18 20  Temp: 98.1 F (36.7 C) 98.2 F (36.8 C)  SpO2: 100% 99%   Physical Exam: Cardiac:  RRR, normal S1, S2 no murmurs present. Lungs: On auscultation scattered bilateral rales.  No rhonchi or wheezing noted. Incisions: Bilateral groin incisions with Prevena wound vacs in place.  Both working well Extremities: Bilateral lower extremities with positive Doppler pulses.  Both DP and PT. Abdomen: Positive bowel sounds throughout, soft, nontender and nondistended. Neurologic: Alert and oriented x 3, follows all commands and answers all questions appropriately. CBC    Component Value Date/Time   WBC 8.2 11/19/2022 0658   RBC 2.56 (L) 11/19/2022 0658   HGB 7.8 (L) 11/19/2022 0658   HGB 15.7 02/16/2012 2013   HCT 23.0 (L) 11/19/2022 0658   HCT 44.4 02/16/2012 2013   PLT 273 11/19/2022 0658   PLT 176 02/16/2012 2013   MCV 89.8 11/19/2022  0658   MCV 91 02/16/2012 2013   MCH 30.5 11/19/2022 0658   MCHC 33.9 11/19/2022 0658   RDW 13.3 11/19/2022 0658   RDW 13.0 02/16/2012 2013   LYMPHSABS 1.8 11/10/2022 0954   MONOABS 1.1 (H) 11/10/2022 0954   EOSABS 0.2 11/10/2022 0954   BASOSABS 0.1 11/10/2022 0954    BMET    Component Value Date/Time   NA 131 (L) 11/19/2022 0658   NA 137 02/16/2012 2013   K 4.1 11/19/2022 0658   K 3.8 02/16/2012 2013   CL 98 11/19/2022 0658   CL 102 02/16/2012 2013   CO2 22 11/19/2022 0658   CO2 25 02/16/2012 2013   GLUCOSE 133 (H) 11/19/2022 0658   GLUCOSE 154 (H) 02/16/2012 2013   BUN 43 (H) 11/19/2022 0658   BUN 10 02/16/2012 2013   CREATININE 1.67 (H) 11/19/2022 0658   CREATININE 0.72 02/16/2012 2013   CALCIUM 7.9 (L) 11/19/2022 0658   CALCIUM 9.6 02/16/2012 2013   GFRNONAA 41 (L) 11/19/2022 0658   GFRNONAA >60 02/16/2012 2013   GFRAA >60 12/09/2018 0929   GFRAA >60 02/16/2012 2013    INR    Component Value Date/Time   INR 1.1 11/10/2022 1124     Intake/Output Summary (Last 24 hours) at 11/19/2022 1259 Last data filed at 11/19/2022 1023 Gross per 24 hour  Intake 1469.73 ml  Output 315 ml  Net 1154.73 ml     Assessment/Plan:  77 y.o. male is s/p left common femoral, profunda femoris, and superficial femoral artery endarterectomy and  right common femoral, profunda femoris and superficial femoral artery endarterectomy.  Patient is also noted to have kissing expandable stents placed in bilateral common iliac arteries as well as stent placement x 2 to the left external iliac artery. Patient is now POD#1 from right lower extremity 4th and 5th toe amputation.  1 Day Post-Op   PLAN: Vascular Surgery okay with patient discharge when medically ready. Prevena Wound Vacs can be removed at home on 11/25/2022.  Patient would benefit from discharge with Home Health RN wound evaluation and Home Health Physical therapy.  Pain medication PRN Follow up as Scheduled.  Patient to be discharged  on ASA 81 mg daily, Plavix 75 mg daily and Lipitor 80 mg Daily.   DVT prophylaxis:  ASA 81 mg daily, Plavix 75 mg daily   Marcie Bal [facility] 11/19/2022 12:59 PM

## 2022-11-19 NOTE — Progress Notes (Signed)
Progress Note   Patient: Manuel [name]:[id number] DOB: 03-08-1946 DOA: 11/10/2022     9 DOS: the patient was seen and examined on 11/19/2022   Brief hospital course: Taken from H&P.   Manuel [name] is a 77 y.o. male with medical history significant of  insulin-dependent DM, HTN, CAD, PAD s/p angioplasty with stent 05/14/2021 presenting with right lower extremity ischemia and ulcer formation with gangrenous toe.  Presented to the ER afebrile, hemodynamically stable. White count 11.2, hemoglobin 15.2, platelets 303, lactate 1.2, creatinine 0.9. T. bili 1.5. Plain films of the right foot with no signs of osteomyelitis. Dr. [name] with vascular surgery initially consulted with recommendation for heparin drip. Dr. [name] with podiatry also consulted for further evaluation.   7/22: Vital stable.  Going for lower extremity angiography and possible angioplasty with vascular today.  CRP elevated at 10.1, decreased prealbumin at 15 and normal ESR.  Preliminary blood cultures negative.  Continue current broad-spectrum antibiotics.  Found to have very extensive disease and most likely will required bilateral femoral endarterectomies and bilateral iliac stents.  Vascular is looking for a OR time for this extensive procedure which will require 4 to 5 hours.  7/23: Vital and labs stable.  Overnight some shortness of breath and crackles.  One-time 40 mg of IV Lasix was given.  Chest x-ray ordered but patient refused. Patient had a choking spell on meds this morning, continued to feel something sticking in his chest.  No hypoxia.  Chest x-ray with diffuse interstitial prominence and areas of nodularity throughout the both lungs.  Differential include atypical inflammatory process versus atypical pulmonary edema.  Ordered 1 dose of IV Lasix Going for his extensive vascular procedure tomorrow.  7/24: Mildly elevated blood pressure.  BNP elevated at 932, surgery got canceled for concern of acute on chronic  heart failure and patient now need cardiology clearance before proceeding. Family concern of suicidal thoughts, psych was consulted.  7/25: Hemodynamically stable.  Will echocardiogram with EF of 25 to 30% with some regional wall motion abnormalities.  Likely weak going for vascular surgery tomorrow, pending final recommendations from cardiology. Psychiatry with no suicidal thoughts.  7/26: Hemodynamically stable.  Going for vascular procedure today.  7/27: Vital stable.  Patient underwent extensive vascular procedure which involved bilateral lower extremity endarterectomies and multiple stents placement.  Pulses present with Doppler this morning.  Significant blood loss during the procedure, hemoglobin decreased to 11.5 this morning, baseline of 13.  Started on supplement.  7/28: Vital stable.  Labs with increase in creatinine to 1.31-holding Lasix today.  Hemoglobin further decreased to 9.4.  Likely be going for toe amputation tomorrow.  Arterial line removed.  7/29: Going for toes removal today.  Slight worsening of renal function today, holding diuretic and giving some IV fluid as patient is n.p.o.  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  Hemoglobin decreased to 7.8 postsurgically. Patient will be going home with 2 wound VAC in place in his groin areas from vascular surgery and they will remove it in the clinic on 11/25/2022.  Repeat PT and OT evaluation pending but he would like to go home with home health.  If remains stable likely can be discharged tomorrow. Heparin infusion is being discontinued and patient will be started on aspirin and Plavix per vascular surgery.  Assessment and Plan: * Ischemic ulcer of lower extremity (HCC) Worsening right lower extremity redness swelling and pain over the past 2 weeks in the setting of baseline limb  ischemia status post angioplasty and stenting January 2023.Noted worsening ulcer formation across multiple digits of the foot with concern  for gangrene Patient reports compliance with Plavix and statin therapy Started on heparin drip in the ER  Vascular procedure with extensive peripheral vascular disease, second procedure for bilateral endarterectomies and bilateral iliac stent placement with vascular surgery was done yesterday, tolerated the procedure well.  Palpable pulses today. -Currently on Unasyn -S/p fourth and fifth toe amputation on 7/29. -Repeat PT and OT evaluation -Patient currently have 2 wound VAC in place in his both groin areas and will be discharging with them. -Stop heparin infusion and patient is being started on aspirin and Plavix per vascular surgery    Diabetic polyneuropathy associated with type 2 diabetes mellitus (HCC) Cont neurontin    Coronary artery disease with history of myocardial infarction without history of CABG Followed by Dr. [name] outpt  CAD s/p PCI to [facility] with overlapping 3 mm Xience stents of 23 mm and 12 mm length in 2011  No active chest pain On plavix monotherapy and statin chronically  On heparin drip in the setting of right lower extremity ischemia Follow  Hypertension Patient required nitro GGT after the procedure which was now being tapered off Titrate home regimen  Arterial line removed   Diabetes mellitus type 2, insulin dependent (HCC) SSI   Alcohol abuse 3-4 beer daily drinker + mixed drinks  Will place on CIWA protocol  Follow   Mixed hyperlipidemia Continue statin  Adjustment disorder with depressed mood Family was concerned about some suicidal thoughts so psychiatry was consulted. They cleared him today as patient denies any suicidal thoughts, stating that he was just frustrated  Acute on chronic combined systolic and diastolic CHF (congestive heart failure) (HCC) According to a prior echo done in 2020, EF of 40 to 45%. Patient with concern of shortness of breath, mild hypoxia and chest x-ray with bilateral opacities.  BNP elevated at 932. Repeat  echocardiogram with further decrease of EF to 20 to 25% with regional wall motion abnormalities. Cardiology is on board- -Keep holding diuretic as slight worsening of renal function -Daily weight and BMP -Strict intake and output   Subjective: Patient was experiencing pain when moved from bed to chair.  He was willing to work with PT, would like to go home with home health.  Physical Exam: Vitals:   11/18/22 2353 11/19/22 0409 11/19/22 0749 11/19/22 1300  BP: 117/60 117/63 (!) 122/59 115/78  Pulse: 71 66 68 60  Resp: 15 18 20    Temp:  98.1 F (36.7 C) 98.2 F (36.8 C) 98 F (36.7 C)  TempSrc:  Oral  Oral  SpO2: 96% 100% 99% 98%  Weight:  109.6 kg    Height:       General.  Frail elderly man, in no acute distress. Pulmonary.  Lungs clear bilaterally, normal respiratory effort. CV.  Regular rate and rhythm, no JVD, rub or murmur. Abdomen.  Soft, nontender, nondistended, BS positive. CNS.  Alert and oriented .  No focal neurologic deficit. Extremities.  No edema, palpable pulses on left, right foot with bandage and Ace wrap. Psychiatry.  Judgment and insight appears normal.   Data Reviewed: Prior data reviewed  Family Communication: Wife at bedside  Disposition: Status is: Inpatient Remains inpatient appropriate because: Severity of illness  Planned Discharge Destination:  Home with home health  Time spent: 45 minutes  This record has been created using Conservation officer, historic buildings. Errors have been sought and corrected,but  may not always be located. Such creation errors do not reflect on the standard of care.   Author: Arnetha Courser, MD 11/19/2022 3:59 PM  For on call review [email or web address].

## 2022-11-19 NOTE — Evaluation (Signed)
Physical Therapy Evaluation Patient Details Name: Manuel [name] MRN: [id number] DOB: 06-Dec-1945 Today's Date: 11/19/2022  History of Present Illness  Pt is a 77 year old male presenting with right lower extremity ischemia and ulcer formation with gangrenous toe now s/p left common femoral, profunda femoris, and superficial femoral artery endarterectomies, Right common femoral, profunda femoris, and superficial femoral artery endarterectomies, stent placements in bilateral common iliac arteries, Additional stent placement x 2 to the left external iliac artery, Angioplasty of the right posterior tibial artery, Angioplasty of the right popliteal artery on 11/15/22;   Pmh significant for insulin-dependent DM, HTN, CAD, PAD s/p angioplasty with stent 05/14/2021. S/P 4 & 5 toe amputation with partial ray amputation on R 11/18/22   Clinical Impression  Patient received in bed, he is pleasant and agrees to PT session. Reports he stood yesterday with his wife prior to knowing he was NWB on Right LE. Patient is able to perform bed mobility with min A. Patient requires min A to stand from elevated bed with RW. He has difficulty maintaining NWB on right and needs cues to do so. He was able to pivot to recliner with min A and RW. He will continue to benefit from skilled PT to improve functional independence and safety.           If plan is discharge home, recommend the following: A lot of help with walking and/or transfers;A little help with bathing/dressing/bathroom;Help with stairs or ramp for entrance;Assist for transportation   Can travel by private vehicle        Equipment Recommendations Rolling walker (2 wheels);Wheelchair (measurements PT);Wheelchair cushion (measurements PT);BSC/3in1  Recommendations for Other Services       Functional Status Assessment Patient has had a recent decline in their functional status and demonstrates the ability to make significant improvements in function in a  reasonable and predictable amount of time.     Precautions / Restrictions Precautions Precautions: Fall Precaution Comments: B wound vacs Restrictions Weight Bearing Restrictions: Yes RLE Weight Bearing: Non weight bearing      Mobility  Bed Mobility Overal bed mobility: Needs Assistance Bed Mobility: Supine to Sit     Supine to sit: Min assist     General bed mobility comments: min A to get scooted out to edge of bed    Transfers Overall transfer level: Needs assistance Equipment used: Rolling walker (2 wheels) Transfers: Sit to/from Stand, Bed to chair/wheelchair/BSC Sit to Stand: Min assist, From elevated surface Stand pivot transfers: Min assist         General transfer comment: min A to boost up to standing. Poor ability to keep R LE entirely off floor in standing    Ambulation/Gait               General Gait Details: unable  Stairs            Wheelchair Mobility     Tilt Bed    Modified Rankin (Stroke Patients Only)       Balance Overall balance assessment: Needs assistance Sitting-balance support: Feet supported Sitting balance-Leahy Scale: Normal     Standing balance support: Bilateral upper extremity supported, During functional activity, Reliant on assistive device for balance Standing balance-Leahy Scale: Fair Standing balance comment: resting right LE on floor for balance, min heel weight bearing. Unable to hop                             Pertinent  Vitals/Pain Pain Assessment Pain Assessment: No/denies pain Pain Intervention(s): Monitored during session, Repositioned    Home Living Family/patient expects to be discharged to:: Private residence Living Arrangements: Spouse/significant other Available Help at Discharge: Family;Available 24 hours/day Type of Home: House Home Access: Stairs to enter   [employer] of Steps: rarmp being built Alternate Level Stairs-Number of Steps: flight Home Layout:  Multi-level;Laundry or work area in [employer]: None Additional Comments: report they may have equipment they can borrow from family members (w/c)    Prior Function Prior Level of Function : Independent/Modified Independent;Driving             Mobility Comments: amb with no AD short community distances ADLs Comments: mod I-I in ADL/IADL     Hand Dominance        Extremity/Trunk Assessment   Upper Extremity Assessment Upper Extremity Assessment: Defer to OT evaluation    Lower Extremity Assessment Lower Extremity Assessment: RLE deficits/detail RLE Coordination: decreased gross motor       Communication   Communication: No difficulties;HOH  Cognition Arousal/Alertness: Awake/alert Behavior During Therapy: WFL for tasks assessed/performed Overall Cognitive Status: Within Functional Limits for tasks assessed                                 General Comments: tangential at times, good follow through with one step directives        General Comments      Exercises     Assessment/Plan    PT Assessment Patient needs continued PT services  PT Problem List Decreased strength;Decreased activity tolerance;Decreased balance;Decreased mobility;Decreased skin integrity;Decreased knowledge of precautions;Decreased knowledge of use of DME;Decreased safety awareness;Impaired sensation       PT Treatment Interventions DME instruction;Gait training;Therapeutic exercise;Balance training;Functional mobility training;Therapeutic activities;Patient/family education    PT Goals (Current goals can be found in the Care Plan section)  Acute Rehab PT Goals Patient Stated Goal: return home PT Goal Formulation: With patient Time For Goal Achievement: 12/03/22 Potential to Achieve Goals: Good    Frequency Min 1X/week     Co-evaluation               AM-PAC PT "6 Clicks" Mobility  Outcome Measure Help needed turning from your back to your side  while in a flat bed without using bedrails?: None Help needed moving from lying on your back to sitting on the side of a flat bed without using bedrails?: A Lot Help needed moving to and from a bed to a chair (including a wheelchair)?: A Little Help needed standing up from a chair using your arms (e.g., wheelchair or bedside chair)?: A Lot Help needed to walk in hospital room?: Total Help needed climbing 3-5 steps with a railing? : Total 6 Click Score: 13    End of Session   Activity Tolerance: Patient limited by fatigue Patient left: in chair;with call bell/phone within reach Nurse Communication: Mobility status PT Visit Diagnosis: Other abnormalities of gait and mobility (R26.89);Muscle weakness (generalized) (M62.81);Unsteadiness on feet (R26.81);Difficulty in walking, not elsewhere classified (R26.2)    Time: 1610-9604 PT Time Calculation (min) (ACUTE ONLY): 29 min   Charges:   PT Evaluation $PT Eval Moderate Complexity: 1 Mod PT Treatments $Therapeutic Activity: 8-22 mins PT General Charges $$ ACUTE PT VISIT: 1 Visit         Foy Mungia, PT, GCS 11/19/22,11:28 AM

## 2022-11-19 NOTE — Evaluation (Signed)
Occupational Therapy Re-Evaluation Patient Details Name: Manuel [name] MRN: [id number] DOB: 1945-12-25 Today's Date: 11/19/2022   History of Present Illness Pt is a 77 year old male presenting with right lower extremity ischemia and ulcer formation with gangrenous toe now s/p left common femoral, profunda femoris, and superficial femoral artery endarterectomies, Right common femoral, profunda femoris, and superficial femoral artery endarterectomies, stent placements in bilateral common iliac arteries, Additional stent placement x 2 to the left external iliac artery, Angioplasty of the right posterior tibial artery, Angioplasty of the right popliteal artery on 11/15/22;   Pmh significant for insulin-dependent DM, HTN, CAD, PAD s/p angioplasty with stent 05/14/2021. 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 See flowsheet below for details of function. Generally, patient requiring MOD A for bed mobility, CGA for functional transfer, and MOD A for ADLs.       Recommendations for follow up therapy are one component of a multi-disciplinary discharge planning process, led by the attending physician.  Recommendations may be updated based on patient status, additional functional criteria and insurance authorization.   Assistance Recommended at Discharge Frequent or constant Supervision/Assistance  Patient can return home with the following A lot of help with walking and/or transfers;A lot of help with bathing/dressing/bathroom;Assistance with cooking/housework;Direct supervision/assist for medications management;Direct supervision/assist for financial management;Assist for transportation;Help with stairs or ramp for entrance    Functional Status Assessment  Patient has had a recent decline in their functional status and demonstrates the ability to make significant improvements in  function in a reasonable and predictable amount of time.  Equipment Recommendations  BSC/3in1;Other (comment) (RW)    Recommendations for Other Services       Precautions / Restrictions Precautions Precautions: Fall Precaution Comments: B wound vacs Restrictions Weight Bearing Restrictions: Yes RLE Weight Bearing: Partial weight bearing RLE Partial Weight Bearing Percentage or Pounds: through heel only with offload shoe on Other Position/Activity Restrictions: offload shoe      Mobility Bed Mobility Overal bed mobility: Needs Assistance Bed Mobility: Sit to Supine       Sit to supine: Mod assist        Transfers Overall transfer level: Needs assistance Equipment used: Rolling walker (2 wheels) Transfers: Sit to/from Stand (from recliner chair) Sit to Stand: Min guard           General transfer comment: sidestep and turn with RW; pt unable to maintain WB status despite cues      Balance Overall balance assessment: Needs assistance Sitting-balance support: Feet supported Sitting balance-Leahy Scale: Normal     Standing balance support: Bilateral upper extremity supported, During functional activity, Reliant on assistive device for balance Standing balance-Leahy Scale: Fair Standing balance comment: needing to use R foot for WB                           ADL either performed or assessed with clinical judgement   ADL Overall ADL's : Needs assistance/impaired                                       General ADL Comments: Pt in a lot of pain intermittently throughout session. Will require overall MOD A for ADLs from mostly seated level. Pt unable to maintain PWB  status throughout session; intermittently putting weight through front of R toes despite OT cues for putting most weight through LLE. UE functional throughout session.     Vision         Perception     Praxis      Pertinent Vitals/Pain Pain Assessment Pain Assessment:  0-10 Pain Score: 8  Pain Location: L upper leg area; intermittently; comes in sharp waves Pain Descriptors / Indicators: Discomfort, Shooting, Sharp Pain Intervention(s): Limited activity within patient's tolerance, Monitored during session, Patient requesting pain meds-RN notified, RN gave pain meds during session     Hand Dominance     Extremity/Trunk Assessment Upper Extremity Assessment Upper Extremity Assessment: Overall WFL for tasks assessed   Lower Extremity Assessment Lower Extremity Assessment: Generalized weakness RLE Coordination: decreased gross motor       Communication Communication Communication: No difficulties;HOH   Cognition Arousal/Alertness: Awake/alert Behavior During Therapy: WFL for tasks assessed/performed Overall Cognitive Status: Within Functional Limits for tasks assessed                                 General Comments: Pt talkative, tangential but redirectable; became very lethargic at end of session after RN gave IV morphine while pt seated EOB after transfer from chair.     General Comments  OT managed drains and IV throughout transfer. Pt on room air. Pt making comments about how he had to have this surgery or he was told he would slowly die; requiring encouragement from OT that the first day after surgery is usually difficult and he has hope of improvement.    Exercises     Shoulder Instructions      Home Living Family/patient expects to be discharged to:: Private residence Living Arrangements: Spouse/significant other Available Help at Discharge: Family;Available 24 hours/day Type of Home: House Home Access: Stairs to enter [employer] of Steps: rarmp being built   Home Layout: Multi-level;Laundry or work area in [employer] of Steps: flight   Bathroom Shower/Tub: Dietitian: None   Additional Comments: report they may have equipment they can borrow  from family members (w/c)      Prior Functioning/Environment Prior Level of Function : Independent/Modified Independent;Driving             Mobility Comments: amb with no AD short community distances ADLs Comments: mod I-I in ADL/IADL        OT Problem List: Decreased strength;Impaired balance (sitting and/or standing);Decreased activity tolerance;Decreased knowledge of use of DME or AE;Decreased knowledge of precautions;Decreased safety awareness      OT Treatment/Interventions: Self-care/ADL training;Balance training;Therapeutic exercise;DME and/or AE instruction;Therapeutic activities;Patient/family education    OT Goals(Current goals can be found in the care plan section) Acute Rehab OT Goals Patient Stated Goal: Get better OT Goal Formulation: With patient/family Time For Goal Achievement: 12/03/22 Potential to Achieve Goals: Good  OT Frequency: Min 1X/week    Co-evaluation              AM-PAC OT "6 Clicks" Daily Activity     Outcome Measure Help from another person eating meals?: None Help from another person taking care of personal grooming?: None Help from another person toileting, which includes using toliet, bedpan, or urinal?: A Lot Help from another person bathing (including washing, rinsing, drying)?: A Lot Help from another person to put on and taking off regular upper body  clothing?: A Little Help from another person to put on and taking off regular lower body clothing?: Total 6 Click Score: 16   End of Session Equipment Utilized During Treatment: Rolling walker (2 wheels) Nurse Communication: Mobility status  Activity Tolerance: Patient tolerated treatment well Patient left: in bed;with call bell/phone within reach;with bed alarm set  OT Visit Diagnosis: Other abnormalities of gait and mobility (R26.89);Unsteadiness on feet (R26.81);Muscle weakness (generalized) (M62.81)                Time: 4782-9562 OT Time Calculation (min): 24 min Charges:   OT General Charges $OT Visit: 1 Visit OT Evaluation $OT Re-eval: 1 Re-eval OT Treatments $Therapeutic Activity: 8-22 mins  Linward Foster, MS, OTR/L  Alvester Morin 11/19/2022, 2:24 PM

## 2022-11-19 NOTE — Care Management Important Message (Signed)
Important Message  Patient Details  Name: Manuel [name] MRN: [id number] Date of Birth: Oct 27, 1945   Medicare Important Message Given:  Yes     Verita Schneiders Marrianne Sica 11/19/2022, 2:44 PM

## 2022-11-19 NOTE — Progress Notes (Signed)
Nutrition Follow-up  DOCUMENTATION CODES:   Not applicable  INTERVENTION:   -Continue MVI with minerals daily -Continue Ensure Max po BID, each supplement provides 150 kcal and 30 grams of protein -Continue 500 mg vitamin C BID  NUTRITION DIAGNOSIS:   Increased nutrient needs related to wound healing as evidenced by estimated needs.  Ongoing  GOAL:   Patient will meet greater than or equal to 90% of their needs  Progressing   MONITOR:   PO intake, Supplement acceptance, Labs, Weight trends, I & O's, Skin  REASON FOR ASSESSMENT:   Consult Wound healing  ASSESSMENT:   77 y.o. male with medical history significant of  insulin-dependent DM, HTN, CAD, etoh abuse, depression, OSA, MI, gout and PAD s/p angioplasty with stent 05/14/2021 who is admitted with right lower extremity ischemia and ulcer formation with gangrenous toe.  7/26- s/p Procedure(s): ENDARTERECTOMY FEMORAL (POSSIBLE BILATERAL FEM-ENDART, RIGHT POPLITEAL AND TIBIAL INTERVENTION) (Right) INSERTION OF ILIAC STENT (Bilateral) APPLICATION OF CELL SAVER (Bilateral) 7/29- s/p Procedure: Amputation right fourth and fifth toes with partial ray resections.   Reviewed I/O's: +1.3 L x 24 hours and +1.1 L since admission  UOP: 200 ml x 24 hours   Pt sleeping soundly at time of visit. He did not respond to voice or touch. No family at bedside.   Pt with good appetite. Noted meal completions 70-90%. Pt has been drinking Ensure Max supplements.   No wt loss noted since admission. Wt gain likely related to edema.   Awaiting therapy recommendations post-op for discharge disposition  Medications reviewed and include colace, vitamin C, and thiamine.   Labs reviewed: Na: 131, CBGS: 124-145 (inpatient orders for glycemic control are 0-9 units insulin aspart TID with meals).    NUTRITION - FOCUSED PHYSICAL EXAM:  Flowsheet Row Most Recent Value  Orbital Region No depletion  Upper Arm Region No depletion  Thoracic  and Lumbar Region No depletion  Buccal Region No depletion  Temple Region No depletion  Clavicle Bone Region No depletion  Clavicle and Acromion Bone Region No depletion  Scapular Bone Region No depletion  Dorsal Hand No depletion  Patellar Region No depletion  Anterior Thigh Region No depletion  Posterior Calf Region No depletion  Edema (RD Assessment) Mild  Hair Reviewed  Eyes Reviewed  Mouth Reviewed  Skin Reviewed  Nails Reviewed       Diet Order:   Diet Order             Diet Carb Modified Fluid consistency: Thin; Room service appropriate? Yes  Diet effective now                   EDUCATION NEEDS:   Not appropriate for education at this time  Skin:  Skin Assessment: Skin Integrity Issues: Skin Integrity Issues:: Wound VAC, Incisions, Diabetic Ulcer Wound Vac: rt groin Diabetic Ulcer: rt foot, necrotic rt 4-th digits Incisions: lt groin  Last BM:  11/14/22  Height:   Ht Readings from Last 1 Encounters:  11/15/22 5\' 8"  (1.727 m)    Weight:   Wt Readings from Last 1 Encounters:  11/19/22 109.6 kg    Ideal Body Weight:  70 kg  BMI:  Body mass index is 36.74 kg/m.  Estimated Nutritional Needs:   Kcal:  2100-2300  Protein:  105-120 grams  Fluid:  > 2 L    Levada Schilling, RD, LDN, CDCES Registered Dietitian II Certified Diabetes Care and Education Specialist Please refer to [facility] for RD and/or RD on-call/weekend/after  hours pager

## 2022-11-19 NOTE — Consult Note (Signed)
ANTICOAGULATION CONSULT NOTE  Pharmacy Consult for Heparin Infusion Indication: chest pain/ACS  No Known Allergies  Patient Measurements: Height: 5\' 8"  (172.7 cm) Weight: 109.6 kg (241 lb 10 oz) IBW/kg (Calculated) : 68.4 Heparin Dosing Weight: 85.7 kg  Vital Signs: Temp: 98.2 F (36.8 C) (07/30 0749) Temp Source: Oral (07/30 0409) BP: 122/59 (07/30 0749) Pulse Rate: 68 (07/30 0749)  Labs: Recent Labs    11/17/22 0526 11/18/22 0429 11/19/22 0658  HGB 9.4* 10.1* 7.8*  HCT 27.8* 31.0* 23.0*  PLT 230 269 273  HEPARINUNFRC 0.60 0.56 0.32  CREATININE 1.31* 1.83* 1.67*    Estimated Creatinine Clearance: 43.1 mL/min (A) (by C-G formula based on SCr of 1.67 mg/dL (H)).   Medical History: Past Medical History:  Diagnosis Date   Allergies    Arthritis    Diabetes mellitus without complication (HCC)    Gout    Hard of hearing    Hyperlipidemia    Hypertension    Myocardial infarction [facility]) 2008   treated here at [facility] and transferred to  for stent placement   Neuropathy    knees down   Peripheral vascular disease (HCC)    Sleep apnea    "in the past"   Status post primary angioplasty with coronary stent    Wears dentures    full upper and lower    Assessment: Manuel [name] is a 77 y.o. male with past medical history of PVD s/p mechanical thrombectomy of right lower extremity in February 2023, CAD, T2DM, HLD, neuropathy. He was on a DOAC previously, but is not on anticoagulant therapy currently. He is here with pain to the right foot and a black toe on the right foot starting approximately 3 days ago. Vascular surgery consulted and planning angiography 7/22. Pharmacy has been consulted to initiate and manage heparin infusion.   7/25 1446 HL 0.47 7/26 0506 HL 0.60, therapeutic x 2 7/27 0747 HL 0.39 7/29 0429 HL 0.56 7/30 0658 HL 0.32, therapeutic  Goal of Therapy:  Heparin level 0.3-0.7 units/ml Monitor platelets by anticoagulation protocol:  Yes  Plan:  Heparin remains therapeutic Continue heparin infusion at 2250 units/hr  Heparin level with AM labs if heparin not stopped Recheck heparin level and CBC daily with AM labs.   Tuff Clabo Rodriguez-Guzman PharmD, BCPS 11/19/2022 8:25 AM

## 2022-11-20 DIAGNOSIS — L97909 Non-pressure chronic ulcer of unspecified part of unspecified lower leg with unspecified severity: Secondary | ICD-10-CM | POA: Diagnosis not present

## 2022-11-20 DIAGNOSIS — I2510 Atherosclerotic heart disease of native coronary artery without angina pectoris: Secondary | ICD-10-CM | POA: Diagnosis not present

## 2022-11-20 DIAGNOSIS — E1142 Type 2 diabetes mellitus with diabetic polyneuropathy: Secondary | ICD-10-CM | POA: Diagnosis not present

## 2022-11-20 DIAGNOSIS — I998 Other disorder of circulatory system: Secondary | ICD-10-CM | POA: Diagnosis not present

## 2022-11-20 LAB — BASIC METABOLIC PANEL
Anion gap: 8 (ref 5–15)
BUN: 55 mg/dL — ABNORMAL HIGH (ref 8–23)
CO2: 24 mmol/L (ref 22–32)
Calcium: 8.2 mg/dL — ABNORMAL LOW (ref 8.9–10.3)
Chloride: 98 mmol/L (ref 98–111)
Creatinine, Ser: 1.99 mg/dL — ABNORMAL HIGH (ref 0.61–1.24)
GFR, Estimated: 34 mL/min — ABNORMAL LOW (ref >60)
Glucose, Bld: 153 mg/dL — ABNORMAL HIGH (ref 70–99)
Potassium: 4.6 mmol/L (ref 3.5–5.1)
Sodium: 130 mmol/L — ABNORMAL LOW (ref 135–145)

## 2022-11-20 LAB — GLUCOSE, CAPILLARY
Glucose-Capillary: 160 mg/dL — ABNORMAL HIGH (ref 70–99)
Glucose-Capillary: 132 mg/dL — ABNORMAL HIGH (ref 70–99)
Glucose-Capillary: 157 mg/dL — ABNORMAL HIGH (ref 70–99)
Glucose-Capillary: 153 mg/dL — ABNORMAL HIGH (ref 70–99)

## 2022-11-20 MED ORDER — FUROSEMIDE 10 MG/ML IJ SOLN
40.0000 mg | Freq: Once | INTRAMUSCULAR | Status: AC
  Administered 2022-11-20: 40 mg via INTRAVENOUS
  Filled 2022-11-20: qty 4

## 2022-11-20 MED ORDER — POLYETHYLENE GLYCOL 3350 17 G PO PACK
17.0000 g | PACK | Freq: Every day | ORAL | Status: DC
  Administered 2022-11-20 – 2022-11-21 (×2): 17 g via ORAL
  Filled 2022-11-20 (×2): qty 1

## 2022-11-20 MED ORDER — FUROSEMIDE 10 MG/ML IJ SOLN
40.0000 mg | Freq: Two times a day (BID) | INTRAMUSCULAR | Status: DC
  Administered 2022-11-20: 40 mg via INTRAVENOUS
  Filled 2022-11-20: qty 4

## 2022-11-20 MED ORDER — AMOXICILLIN-POT CLAVULANATE 875-125 MG PO TABS
1.0000 | ORAL_TABLET | Freq: Two times a day (BID) | ORAL | Status: DC
  Administered 2022-11-20 – 2022-11-25 (×10): 1 via ORAL
  Filled 2022-11-20 (×10): qty 1

## 2022-11-20 NOTE — Progress Notes (Signed)
Physical Therapy Treatment Patient Details Name: Manuel [name] MRN: [id number] DOB: April 21, 1946 Today's Date: 11/20/2022   History of Present Illness Pt is a 77 year old male presenting with right lower extremity ischemia and ulcer formation with gangrenous toe now s/p left common femoral, profunda femoris, and superficial femoral artery endarterectomies, Right common femoral, profunda femoris, and superficial femoral artery endarterectomies, stent placements in bilateral common iliac arteries, Additional stent placement x 2 to the left external iliac artery, Angioplasty of the right posterior tibial artery, Angioplasty of the right popliteal artery on 11/15/22;   Pmh significant for insulin-dependent DM, HTN, CAD, PAD s/p angioplasty with stent 05/14/2021. S/P 4 & 5 toe amputation with partial ray amputation on R 11/18/22    PT Comments  Pt was pleasant and motivated to participate during the session and put forth good effort throughout. Pt required extra time and effort and use of bed rail with sup to sit but no physical assistance.  Pt reported mild dizziness once in sitting with seated BP, HR, and SpO2 all WNL with symptoms resolving quickly.  Pt required multi-modal cuing for proper sequencing with transfers and gait but no physical assist.  Pt was generally steady without overt LOB with standing and walking with good WB compliance and wedge shoe donned to R foot throughout.  Pt will benefit from continued PT services upon discharge to safely address deficits listed in patient problem list for decreased caregiver assistance and eventual return to PLOF.      If plan is discharge home, recommend the following: A lot of help with walking and/or transfers;A little help with bathing/dressing/bathroom;Help with stairs or ramp for entrance;Assist for transportation   Can travel by private vehicle        Equipment Recommendations  Rolling walker (2 wheels);BSC/3in1    Recommendations for Other  Services       Precautions / Restrictions Precautions Precautions: Fall Precaution Comments: B wound vacs Restrictions Weight Bearing Restrictions: Yes RLE Weight Bearing: Partial weight bearing RLE Partial Weight Bearing Percentage or Pounds: Per note from Dr. Alberteen Spindle: Pt may be partial weightbearing in his OrthoWedge surgical shoe using crutches or walker with pressure only on the heel of the shoe.  Minimal weightbearing for the first couple of weeks. Other Position/Activity Restrictions: Ortho wedge shoe to R foot with WB activity     Mobility  Bed Mobility Overal bed mobility: Needs Assistance Bed Mobility: Supine to Sit     Supine to sit: Supervision     General bed mobility comments: Extra time and effort and use of bed rail but no physical assistance required    Transfers Overall transfer level: Needs assistance Equipment used: Rolling walker (2 wheels) Transfers: Sit to/from Stand Sit to Stand: From elevated surface, Min guard           General transfer comment: Min to mod verbal cues for sequencing but no physical assistance needed    Ambulation/Gait Ambulation/Gait assistance: Min guard Gait Distance (Feet): 4 Feet Assistive device: Rolling walker (2 wheels) Gait Pattern/deviations: Step-to pattern, Decreased stance time - right, Decreased step length - left Gait velocity: decreased     General Gait Details: Mod multi-modal cues for proper sequencing for WB compliance with ortho wedge shoe donned   Stairs             Wheelchair Mobility     Tilt Bed    Modified Rankin (Stroke Patients Only)       Balance Overall balance assessment: Needs assistance Sitting-balance  support: Feet supported Sitting balance-Leahy Scale: Good     Standing balance support: Bilateral upper extremity supported, During functional activity, Reliant on assistive device for balance Standing balance-Leahy Scale: Fair                               Cognition Arousal/Alertness: Awake/alert Behavior During Therapy: WFL for tasks assessed/performed Overall Cognitive Status: Within Functional Limits for tasks assessed                                          Exercises      General Comments        Pertinent Vitals/Pain Pain Assessment Pain Assessment: 0-10 Pain Score: 10-Worst pain ever Pain Location: RLE Pain Descriptors / Indicators: Aching, Sharp Pain Intervention(s): Repositioned, Premedicated before session, Monitored during session    Home Living                          Prior Function            PT Goals (current goals can now be found in the care plan section) Progress towards PT goals: Progressing toward goals    Frequency    7X/week      PT Plan Frequency needs to be updated    Co-evaluation              AM-PAC PT "6 Clicks" Mobility   Outcome Measure  Help needed turning from your back to your side while in a flat bed without using bedrails?: None Help needed moving from lying on your back to sitting on the side of a flat bed without using bedrails?: A Little Help needed moving to and from a bed to a chair (including a wheelchair)?: A Little Help needed standing up from a chair using your arms (e.g., wheelchair or bedside chair)?: A Little Help needed to walk in hospital room?: A Lot Help needed climbing 3-5 steps with a railing? : Total 6 Click Score: 16    End of Session Equipment Utilized During Treatment: Gait belt Activity Tolerance: Patient tolerated treatment well Patient left: in chair;with call bell/phone within reach;with chair alarm set Nurse Communication: Mobility status PT Visit Diagnosis: Other abnormalities of gait and mobility (R26.89);Muscle weakness (generalized) (M62.81);Unsteadiness on feet (R26.81);Difficulty in walking, not elsewhere classified (R26.2)     Time: 1308-6578 PT Time Calculation (min) (ACUTE ONLY): 34 min  Charges:     $Gait Training: 8-22 mins $Therapeutic Activity: 8-22 mins PT General Charges $$ ACUTE PT VISIT: 1 Visit                     D. Scott Tiffony Kite PT, DPT 11/20/22, 11:44 AM

## 2022-11-20 NOTE — Progress Notes (Signed)
Progress Note   Patient: Manuel [name]:[id number] DOB: Nov 01, 1945 DOA: 11/10/2022     10 DOS: the patient was seen and examined on 11/20/2022   Brief hospital course: Taken from H&P.   Manuel [name] is a 77 y.o. male with medical history significant of  insulin-dependent DM, HTN, CAD, PAD s/p angioplasty with stent 05/14/2021 presenting with right lower extremity ischemia and ulcer formation with gangrenous toe.  Presented to the ER afebrile, hemodynamically stable. White count 11.2, hemoglobin 15.2, platelets 303, lactate 1.2, creatinine 0.9. T. bili 1.5. Plain films of the right foot with no signs of osteomyelitis. Dr. [name] with vascular surgery initially consulted with recommendation for heparin drip. Dr. [name] with podiatry also consulted for further evaluation.   7/22: Vital stable.  Going for lower extremity angiography and possible angioplasty with vascular today.  CRP elevated at 10.1, decreased prealbumin at 15 and normal ESR.  Preliminary blood cultures negative.  Continue current broad-spectrum antibiotics.  Found to have very extensive disease and most likely will required bilateral femoral endarterectomies and bilateral iliac stents.  Vascular is looking for a OR time for this extensive procedure which will require 4 to 5 hours.  7/23: Vital and labs stable.  Overnight some shortness of breath and crackles.  One-time 40 mg of IV Lasix was given.  Chest x-ray ordered but patient refused. Patient had a choking spell on meds this morning, continued to feel something sticking in his chest.  No hypoxia.  Chest x-ray with diffuse interstitial prominence and areas of nodularity throughout the both lungs.  Differential include atypical inflammatory process versus atypical pulmonary edema.  Ordered 1 dose of IV Lasix Going for his extensive vascular procedure tomorrow.  7/24: Mildly elevated blood pressure.  BNP elevated at 932, surgery got canceled for concern of acute on chronic  heart failure and patient now need cardiology clearance before proceeding. Family concern of suicidal thoughts, psych was consulted.  7/25: Hemodynamically stable.  Will echocardiogram with EF of 25 to 30% with some regional wall motion abnormalities.  Likely weak going for vascular surgery tomorrow, pending final recommendations from cardiology. Psychiatry with no suicidal thoughts.  7/26: Hemodynamically stable.  Going for vascular procedure today.  7/27: Vital stable.  Patient underwent extensive vascular procedure which involved bilateral lower extremity endarterectomies and multiple stents placement.  Pulses present with Doppler this morning.  Significant blood loss during the procedure, hemoglobin decreased to 11.5 this morning, baseline of 13.  Started on supplement.  7/28: Vital stable.  Labs with increase in creatinine to 1.31-holding Lasix today.  Hemoglobin further decreased to 9.4.  Likely be going for toe amputation tomorrow.  Arterial line removed.  7/29: Going for toes removal today.  Slight worsening of renal function today, holding diuretic and giving some IV fluid as patient is n.p.o.  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  Hemoglobin decreased to 7.8 postsurgically. Patient will be going home with 2 wound VAC in place in his groin areas from vascular surgery and they will remove it in the clinic on 11/25/2022.  Repeat PT and OT evaluation pending but he would like to go home with home health.  If remains stable likely can be discharged tomorrow. Heparin infusion is being discontinued and patient will be started on aspirin and Plavix per vascular surgery.  7/31: Hemodynamically stable.  Physical therapy recommendation remained for home health, which was ordered.  Unasyn can be switched to Augmentin on discharge for 1 more week per podiatry.  Appears volume up again today, worsening renal function and lower extremity edema, remained on room air.  Restarting IV Lasix.   Holding discharge until volume status improves.  Assessment and Plan: * Ischemic ulcer of lower extremity (HCC) Worsening right lower extremity redness swelling and pain over the past 2 weeks in the setting of baseline limb ischemia status post angioplasty and stenting January 2023.Noted worsening ulcer formation across multiple digits of the foot with concern for gangrene Patient reports compliance with Plavix and statin therapy Started on heparin drip in the ER  Vascular procedure with extensive peripheral vascular disease, second procedure for bilateral endarterectomies and bilateral iliac stent placement with vascular surgery was done yesterday, tolerated the procedure well.  Palpable pulses today. 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 -Repeat PT and OT evaluation -Patient currently have 2 wound VAC in place in his both groin areas and will be discharging with them. -Heparin was discontinued yesterday and he was started on aspirin and Plavix per vascular surgery    Diabetic polyneuropathy associated with type 2 diabetes mellitus (HCC) Cont neurontin    Coronary artery disease with history of myocardial infarction without history of CABG Followed by Dr. [name] outpt  CAD s/p PCI to [facility] with overlapping 3 mm Xience stents of 23 mm and 12 mm length in 2011  No active chest pain On plavix monotherapy and statin chronically  On heparin drip in the setting of right lower extremity ischemia Follow  Hypertension Patient required nitro GGT after the procedure which was now being tapered off Titrate home regimen  Arterial line removed   Diabetes mellitus type 2, insulin dependent (HCC) SSI   Alcohol abuse 3-4 beer daily drinker + mixed drinks  Will place on CIWA protocol  Follow   Mixed hyperlipidemia Continue statin  Adjustment disorder with depressed mood Family was concerned about some  suicidal thoughts so psychiatry was consulted. They cleared him today as patient denies any suicidal thoughts, stating that he was just frustrated  Acute on chronic combined systolic and diastolic CHF (congestive heart failure) (HCC) According to a prior echo done in 2020, EF of 40 to 45%. Patient with concern of shortness of breath, mild hypoxia and chest x-ray with bilateral opacities.  BNP elevated at 932. Repeat echocardiogram with further decrease of EF to 20 to 25% with regional wall motion abnormalities. Becoming volume overloaded again with slight worsening of renal function as Lasix was held for the past couple of days. No cardiology follow-up for the past 3 days -Restarting IV Lasix at 40 mg twice daily. -Daily weight and BMP -Strict intake and output   Subjective: Patient was sitting comfortably in chair when seen today.  Denies any chest pain or shortness of breath.  Wants to go home but agrees to stay 1-2 more days due to worsening lower extremity edema.  Physical Exam: Vitals:   11/20/22 0357 11/20/22 0859 11/20/22 0900 11/20/22 1221  BP: 112/60 (!) 128/112 130/67 108/62  Pulse: (!) 59 62 63 61  Resp: 18 16  16   Temp: 97.9 F (36.6 C) 98 F (36.7 C)  98 F (36.7 C)  TempSrc: Oral     SpO2: 98% 95%  96%  Weight: 115 kg     Height:       General.  Frail elderly man, in no acute distress. Pulmonary.  Lungs clear bilaterally, normal respiratory effort. CV.  Regular rate and rhythm, no JVD,  rub or murmur. Abdomen.  Soft, nontender, nondistended, BS positive. CNS.  Alert and oriented .  No focal neurologic deficit. Extremities.  2+ LE edema, no cyanosis, palpable pulses on left, right with Ace wrap and bandage Psychiatry.  Judgment and insight appears normal.    Data Reviewed: Prior data reviewed  Family Communication: No family at bedside Disposition: Status is: Inpatient Remains inpatient appropriate because: Severity of illness  Planned Discharge Destination:   Home with home health  Time spent: 44 minutes  This record has been created using Conservation officer, historic buildings. Errors have been sought and corrected,but may not always be located. Such creation errors do not reflect on the standard of care.   Author: Arnetha Courser, MD 11/20/2022 2:09 PM  For on call review [email or web address].

## 2022-11-20 NOTE — Progress Notes (Signed)
[facility] CLINIC CARDIOLOGY CONSULT NOTE       Patient ID: Manuel [name] MRN: [id number] DOB/AGE: August 12, 1945 77 y.o.  Admit date: 11/10/2022 Referring Physician Dr. Arnetha Courser  Primary Physician Dr. Marcelino Duster  Primary Cardiologist Dr. 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 Bilateral LE revascularization with Dr. Wyn Quaker was planned on 7/24 but cancelled 2/2 dyspnea and heart failure symptoms. Cardiology is consulted for assistance with the patients heart failure and medical optimization prior to vascular surgery. Echo this admission showed reduced EF 25-30% with multiple rWMAs, mild to moderate MR. Now s/p bilateral common femoral, profunda femoris, SFA endarterectomies, and bilateral common iliac artery kissing expandable stents, and left external iliac stenting x 2 performed on 7/26.  Also s/p right fourth and fifth toe amputations performed 7/29.  Interval History:  - reengaged today by primary team - had shortness of breath and PND overnight. Worsening LE edema bilaterally.  No chest pain. - We discussed his echo results in detail and management of his's heart failure symptoms moving forward  Review of systems complete and found to be negative unless listed above     Past Medical History:  Diagnosis Date   Allergies    Arthritis    Diabetes mellitus without complication (HCC)    Gout    Hard of hearing    Hyperlipidemia    Hypertension    Myocardial infarction [facility]) 2008   treated here at [facility] and transferred to [facility] for stent placement   Neuropathy    knees down    Peripheral vascular disease (HCC)    Sleep apnea    "in the past"   Status post primary angioplasty with coronary stent    Wears dentures    full upper and lower    Past Surgical History:  Procedure Laterality Date   AMPUTATION Right 11/18/2022   Procedure: AMPUTATION 4TH AND 5TH RAY;  Surgeon: Linus Galas, [name];  Location: [facility] ORS;  Service: Orthopedics/Podiatry;  Laterality: Right;  4th and 5th toe   CATARACT EXTRACTION W/PHACO Right 03/14/2021   Procedure: CATARACT EXTRACTION PHACO AND INTRAOCULAR LENS PLACEMENT (IOC) RIGHT DIABETIC;  Surgeon: Lockie Mola, MD;  Location: [facility];  Service: Ophthalmology;  Laterality: Right;  Diabetic 16.78 01:39.9   CATARACT EXTRACTION W/PHACO Left 03/28/2021   Procedure: CATARACT EXTRACTION PHACO AND INTRAOCULAR LENS PLACEMENT (IOC) LEFT DIABETIC 6.93 01:22.0;  Surgeon: Lockie Mola, MD;  Location: [facility];  Service: Ophthalmology;  Laterality: Left;  Diabetic   ENDARTERECTOMY FEMORAL Bilateral 11/15/2022   Procedure: BILATERAL COMMON FEMORAL PROFUNDA FEMORIS AND SUPERFICIAL FEMORAL ARTERY ENDARTECTOMIES, RIGHT FOGARTY EMBOLECTOMY OF THE RIGHT SFA  AND  POPLITEAL ARTERIES. AORTAGRAM AND RIGHT LOWER EXTREMITY ANGIOGRAM.;  Surgeon: Annice Needy, MD;  Location: [facility] ORS;  Service: Vascular;  Laterality: Bilateral;   INSERTION OF ILIAC STENT Bilateral 11/15/2022   Procedure: BILATERAL STENT INSERTION IN BILATERAL  COMMON ILIAC ARTERY, STENT INSERTION OF LEFT EXTERNAL ILIAC ARTERY. ANGIOPLASTY RIGHT TIBIAL  AND POPLITEAL ARTERY.;  Surgeon: Annice Needy, MD;  Location: [facility] ORS;  Service: Vascular;  Laterality: Bilateral;   LOWER EXTREMITY ANGIOGRAPHY Right  05/14/2021   Procedure: LOWER EXTREMITY ANGIOGRAPHY;  Surgeon: Annice Needy, MD;  Location: [facility] INVASIVE CV LAB;  Service: Cardiovascular;  Laterality: Right;   LOWER EXTREMITY ANGIOGRAPHY Right 06/14/2021   Procedure: Lower Extremity Angiography;  Surgeon: Annice Needy,  MD;  Location: [facility] INVASIVE CV LAB;  Service: Cardiovascular;  Laterality: Right;   LOWER EXTREMITY ANGIOGRAPHY Right 11/11/2022   Procedure: Lower Extremity Angiography;  Surgeon: Annice Needy, MD;  Location: [facility] INVASIVE CV LAB;  Service: Cardiovascular;  Laterality: Right;   LOWER EXTREMITY INTERVENTION Right 06/15/2021   Procedure: LOWER EXTREMITY INTERVENTION;  Surgeon: Annice Needy, MD;  Location: [facility] INVASIVE CV LAB;  Service: Cardiovascular;  Laterality: Right;   TONSILLECTOMY     had removed as a child    Medications Prior to Admission  Medication Sig Dispense Refill Last Dose   amLODipine (NORVASC) 10 MG tablet Take 10 mg by mouth daily.   Past Week   carvedilol (COREG) 12.5 MG tablet Take 1 tablet by mouth 2 (two) times daily.   Past Week   clopidogrel (PLAVIX) 75 MG tablet Take 1 tablet (75 mg total) by mouth daily. 90 tablet 1 Past Week   gabapentin (NEURONTIN) 400 MG capsule Take 800 mg by mouth 2 (two) times daily.   Past Week   insulin aspart protamine- aspart (NOVOLOG MIX 70/30) (70-30) 100 UNIT/ML injection Inject 32 Units into the skin 2 (two) times daily with a meal.   Past Week   lisinopril (ZESTRIL) 20 MG tablet Take 1 tablet (20 mg total) by mouth daily. 30 tablet 1 Past Week   apixaban (ELIQUIS) 5 MG TABS tablet Take 1 tablet (5 mg total) by mouth 2 (two) times daily. (Patient not taking: Reported on 01/25/2022) 60 tablet 1 Not Taking   aspirin EC 81 MG tablet Take 1 tablet (81 mg total) by mouth daily. (Patient not taking: Reported on 08/13/2022) 150 tablet 2 Not Taking   atorvastatin (LIPITOR) 80 MG tablet Take 1 tablet (80 mg total) by mouth daily. (Patient not taking: Reported on 11/11/2022) 30 tablet 1 Not Taking   cefdinir (OMNICEF) 300 MG capsule Take 300 mg by mouth 2 (two) times daily. (Patient not taking: Reported on 11/11/2022)   Not Taking   DULoxetine (CYMBALTA) 30 MG capsule Take 30 mg by mouth daily. (Patient not taking: Reported on 07/11/2021)      fluticasone  (FLONASE) 50 MCG/ACT nasal spray Place 2 sprays into both nostrils as needed for allergies or rhinitis. (Patient not taking: Reported on 07/11/2021)      gabapentin (NEURONTIN) 100 MG capsule Take 100 mg by mouth at bedtime. (Patient not taking: Reported on 11/11/2022)   Not Taking   Ibuprofen 200 MG CAPS Take 2 tablets by mouth 3 times/day as needed-between meals & bedtime. (Patient not taking: Reported on 11/11/2022)   Not Taking   levocetirizine (XYZAL) 5 MG tablet Take 5 mg by mouth at bedtime. (Patient not taking: Reported on 08/13/2022)   Not Taking   polyethylene glycol (MIRALAX / GLYCOLAX) 17 g packet Take 17 g by mouth daily. (Patient not taking: Reported on 08/13/2022) 14 each 0    senna-docusate (SENOKOT-S) 8.6-50 MG tablet Take 1 tablet by mouth at bedtime as needed for mild constipation. 30 tablet 0 prn   zolpidem (AMBIEN) 5 MG tablet Take 1 tablet (5 mg total) by mouth at bedtime as needed for up to 5 doses for sleep. (Patient not taking: Reported on 07/11/2021) 5 tablet 0 Not Taking  Social History   Socioeconomic History   Marital status: Married    Spouse name: Not on file   Number of children: Not on file   Years of education: Not on file   Highest education level: Not on file  Occupational History   Not on file  Tobacco Use   Smoking status: Never   Smokeless tobacco: Never  Vaping Use   Vaping status: Never Used  Substance and Sexual Activity   Alcohol use: Yes    Alcohol/week: 21.0 standard drinks of alcohol    Types: 7 Glasses of wine, 14 Cans of beer per week    Comment: occassional   Drug use: No   Sexual activity: Not on file  Other Topics Concern   Not on file  Social History Narrative   Not on file   Social Determinants of Health   Financial Resource Strain: Not on file  Food Insecurity: No Food Insecurity (11/10/2022)   Hunger Vital Sign    Worried About Running Out of Food in the Last Year: Never true    Ran Out of Food in the Last Year: Never true   Transportation Needs: No Transportation Needs (11/10/2022)   PRAPARE - Administrator, Civil Service (Medical): No    Lack of Transportation (Non-Medical): No  Physical Activity: Not on file  Stress: Not on file  Social Connections: Not on file  Intimate Partner Violence: Not At Risk (11/10/2022)   Humiliation, Afraid, Rape, and Kick questionnaire    Fear of Current or Ex-Partner: No    Emotionally Abused: No    Physically Abused: No    Sexually Abused: No    Family History  Problem Relation Age of Onset   Scoliosis Mother    Heart disease Father       Intake/Output Summary (Last 24 hours) at 11/20/2022 1411 Last data filed at 11/20/2022 1330 Gross per 24 hour  Intake 820 ml  Output 800 ml  Net 20 ml    Vitals:   11/20/22 0357 11/20/22 0859 11/20/22 0900 11/20/22 1221  BP: 112/60 (!) 128/112 130/67 108/62  Pulse: (!) 59 62 63 61  Resp: 18 16  16   Temp: 97.9 F (36.6 C) 98 F (36.7 C)  98 F (36.7 C)  TempSrc: Oral     SpO2: 98% 95%  96%  Weight: 115 kg     Height:        PHYSICAL EXAM General: Pleasant conversational elderly male, well nourished, in no acute distress.  Sitting in recliner, with legs elevated. HEENT:  Normocephalic and atraumatic. Neck:  No JVD.  Lungs: Normal respiratory effort on room air.  Trace right basilar crackles without appreciable wheezes.   Heart: HRRR . Normal S1 and S2 without gallops or murmurs.  Abdomen: Non-distended appearing adiposity.  Msk: Normal strength and tone for age. Extremities: 1-2+ bilateral lower extremity edema. surgical shoe and dressing applied to right lower extremity.   Neuro: Alert and oriented X 3. Psych:  Answers questions appropriately.   Labs: Basic Metabolic Panel: Recent Labs    11/19/22 0658 11/20/22 1225  NA 131* 130*  K 4.1 4.6  CL 98 98  CO2 22 24  GLUCOSE 133* 153*  BUN 43* 55*  CREATININE 1.67* 1.99*  CALCIUM 7.9* 8.2*   Liver Function Tests: No results for input(s): "AST",  "ALT", "ALKPHOS", "BILITOT", "PROT", "ALBUMIN" in the last 72 hours. No results for input(s): "LIPASE", "AMYLASE" in the last 72 hours. CBC: Recent Labs  11/18/22 0429 11/19/22 0658  WBC 10.6* 8.2  HGB 10.1* 7.8*  HCT 31.0* 23.0*  MCV 92.8 89.8  PLT 269 273   Cardiac Enzymes: No results for input(s): "CKTOTAL", "CKMB", "CKMBINDEX", "TROPONINIHS" in the last 72 hours. BNP: No results for input(s): "BNP" in the last 72 hours.  D-Dimer: No results for input(s): "DDIMER" in the last 72 hours. Hemoglobin A1C: No results for input(s): "HGBA1C" in the last 72 hours. Fasting Lipid Panel: No results for input(s): "CHOL", "HDL", "LDLCALC", "TRIG", "CHOLHDL", "LDLDIRECT" in the last 72 hours. Thyroid Function Tests: No results for input(s): "TSH", "T4TOTAL", "T3FREE", "THYROIDAB" in the last 72 hours.  Invalid input(s): "FREET3" Anemia Panel: No results for input(s): "VITAMINB12", "FOLATE", "FERRITIN", "TIBC", "IRON", "RETICCTPCT" in the last 72 hours.   Radiology: DG C-Arm 1-60 Min-No Report  Result Date: 11/15/2022 Fluoroscopy was utilized by the requesting physician.  No radiographic interpretation.   ECHOCARDIOGRAM COMPLETE  Result Date: 11/14/2022    ECHOCARDIOGRAM REPORT   Patient Name:   Manuel [name] Date of Exam: 11/14/2022 Medical Rec #:  [id number]        Height:       68.0 in Accession #:    [id number]       Weight:       191.6 lb Date of Birth:  December 25, 1945        BSA:          2.007 m Patient Age:    50 years         BP:           151/83 mmHg Patient Gender: M                HR:           88 bpm. Exam Location:  [facility] Procedure: 2D Echo, Cardiac Doppler, Color Doppler and Strain Analysis Indications:     Dyspnea R06.00  History:         Patient has no prior history of Echocardiogram examinations.                  Previous Myocardial Infarction; Risk Factors:Hypertension and                  Diabetes.  Sonographer:     Cristela Blue Referring Phys:  [id number] Tonna Corner MICHELLE Joyanne Eddinger  Diagnosing Phys: Manuel Millard MD  Sonographer Comments: Global longitudinal strain was attempted. IMPRESSIONS  1. Left ventricular ejection fraction, by estimation, is 25 to 30%. The left ventricle has severely decreased function. The left ventricle demonstrates regional wall motion abnormalities (see scoring diagram/findings for description). Left ventricular diastolic parameters were normal.  2. Right ventricular systolic function is normal. The right ventricular size is normal.  3. The mitral valve is normal in structure. Mild to moderate mitral valve regurgitation. No evidence of mitral stenosis.  4. Tricuspid valve regurgitation is mild to moderate.  5. The aortic valve is normal in structure. Aortic valve regurgitation is not visualized. No aortic stenosis is present.  6. The inferior vena cava is normal in size with greater than 50% respiratory variability, suggesting right atrial pressure of 3 mmHg. FINDINGS  Left Ventricle: Left ventricular ejection fraction, by estimation, is 25 to 30%. The left ventricle has severely decreased function. The left ventricle demonstrates regional wall motion abnormalities. The left ventricular internal cavity size was normal  in size. There is no left ventricular hypertrophy. Left ventricular diastolic parameters were normal.  LV Wall Scoring: The basal anteroseptal segment, apical  lateral segment, apical septal segment, and apex are akinetic. The mid anterolateral segment is hypokinetic. Right Ventricle: The right ventricular size is normal. No increase in right ventricular wall thickness. Right ventricular systolic function is normal. Left Atrium: Left atrial size was normal in size. Right Atrium: Right atrial size was normal in size. Pericardium: There is no evidence of pericardial effusion. Mitral Valve: The mitral valve is normal in structure. Mild to moderate mitral valve regurgitation. No evidence of mitral valve stenosis. Tricuspid Valve: The tricuspid valve  is normal in structure. Tricuspid valve regurgitation is mild to moderate. No evidence of tricuspid stenosis. Aortic Valve: The aortic valve is normal in structure. Aortic valve regurgitation is not visualized. No aortic stenosis is present. Aortic valve mean gradient measures 2.0 mmHg. Aortic valve peak gradient measures 4.2 mmHg. Aortic valve area, by VTI measures 2.55 cm. Pulmonic Valve: The pulmonic valve was normal in structure. Pulmonic valve regurgitation is not visualized. No evidence of pulmonic stenosis. Aorta: The aortic root is normal in size and structure. Venous: The inferior vena cava is normal in size with greater than 50% respiratory variability, suggesting right atrial pressure of 3 mmHg. IAS/Shunts: No atrial level shunt detected by color flow Doppler.  LEFT VENTRICLE PLAX 2D LVIDd:         4.60 cm      Diastology LVIDs:         4.00 cm      LV e' medial:    3.70 cm/s LV PW:         1.00 cm      LV E/e' medial:  30.3 LV IVS:        1.30 cm      LV e' lateral:   6.09 cm/s LVOT diam:     2.00 cm      LV E/e' lateral: 18.4 LV SV:         44 LV SV Index:   22 LVOT Area:     3.14 cm  LV Volumes (MOD) LV vol d, MOD A2C: 156.0 ml LV vol d, MOD A4C: 191.0 ml LV vol s, MOD A2C: 119.0 ml LV vol s, MOD A4C: 134.0 ml LV SV MOD A2C:     37.0 ml LV SV MOD A4C:     191.0 ml LV SV MOD BP:      41.9 ml RIGHT VENTRICLE RV Basal diam:  4.40 cm RV Mid diam:    3.50 cm LEFT ATRIUM             Index        RIGHT ATRIUM           Index LA diam:        3.90 cm 1.94 cm/m   RA Area:     12.60 cm LA Vol (A2C):   68.0 ml 33.89 ml/m  RA Volume:   28.00 ml  13.95 ml/m LA Vol (A4C):   57.5 ml 28.65 ml/m LA Biplane Vol: 63.6 ml 31.69 ml/m  AORTIC VALVE AV Area (Vmax):    2.43 cm AV Area (Vmean):   2.51 cm AV Area (VTI):     2.55 cm AV Vmax:           102.00 cm/s AV Vmean:          67.500 cm/s AV VTI:            0.174 m AV Peak Grad:      4.2 mmHg AV Mean Grad:  2.0 mmHg LVOT Vmax:         79.00 cm/s LVOT Vmean:         53.900 cm/s LVOT VTI:          0.141 m LVOT/AV VTI ratio: 0.81  AORTA Ao Root diam: 3.50 cm MITRAL VALVE MV Area (PHT): 5.02 cm     SHUNTS MV Decel Time: 151 msec     Systemic VTI:  0.14 m MV E velocity: 112.00 cm/s  Systemic Diam: 2.00 cm MV A velocity: 79.10 cm/s MV E/A ratio:  1.42 Manuel Millard MD Electronically signed by Manuel Millard MD Signature Date/Time: 11/14/2022/1:14:40 PM    Final    DG Chest Port 1 View  Result Date: 11/12/2022 CLINICAL DATA:  Shortness of breath EXAM: PORTABLE CHEST 1 VIEW COMPARISON:  Prior chest x-ray 11/09/2021 FINDINGS: Diffuse irregular interstitial prominence with areas of nodularity throughout the lungs. Small bilateral pleural effusions and associated bibasilar atelectasis. Cardiac and mediastinal contours are within normal limits. Atherosclerotic calcifications are visible in the transverse aorta. No pneumothorax. No acute osseous abnormality. Bilateral glenohumeral joint osteoarthritis. IMPRESSION: Diffuse interstitial prominence with areas of nodularity throughout both lungs. Differential considerations include an acute atypical infectious/inflammatory process such as viral pneumonia versus atypical pulmonary edema. Trace bilateral pleural effusions and associated bibasilar atelectasis. Normal heart size. Electronically Signed   By: Malachy Moan M.D.   On: 11/12/2022 13:39   PERIPHERAL VASCULAR CATHETERIZATION  Result Date: 11/11/2022 See surgical note for result.  DG Foot Complete Right  Result Date: 11/10/2022 CLINICAL DATA:  Toe ulcers.  Evaluate for osteomyelitis. EXAM: RIGHT FOOT COMPLETE - 3+ VIEW COMPARISON:  None Available. FINDINGS: There are no signs of acute fracture or dislocation. No focal bone erosions of osteomyelitis identified. Mild hallux valgus deformity with mild degenerative changes at the first MTP joint. Mild dorsal soft tissue edema. IMPRESSION: 1. No acute findings. No signs of osteomyelitis. 2. Mild hallux valgus  deformity with mild first MTP joint osteoarthritis. Electronically Signed   By: Signa Kell M.D.   On: 11/10/2022 10:00    ECHO 11/10/2018 MILD LV SYSTOLIC DYSFUNCTION WITH AN ESTIMATED EF = 40-45 %  NORMAL RIGHT VENTRICULAR SYSTOLIC FUNCTION  MILD-TO-MODERATE MITRAL VALVE INSUFFICIENCY  TRACE TRICUSPID VALVE INSUFFICIENCY  NO VALVULAR STENOSIS  INFERIOR WALL HYPOKINESIS  _________________________________________________________________________________________  Electronically signed by      MD Arnoldo Hooker on 11/10/2018 10: 43 AM           Performed By: Luretha Murphy, RDCS, RVT     Ordering Physician: [name], MD Velna Ochs myoview 11/10/2018 Normal Lexiscan infusion EKG  Mild segmental LV systolic dysfunction with inferior hypokinesis  Fixed and minimal reversible inferior myocardial perfusion defect  consistent with mainly previous infarct with peri-infarct myocardial  ischemia  Clinical correlation advised   Arnoldo Hooker   TELEMETRY reviewed by me (LT) 11/20/2022 : Sinus rhythm rate 80s to 90s  EKG reviewed by me: NSR 70, nonspec IVCD, hx old inferior infarct  Data reviewed by me (LT) 11/20/2022: vascular surgery notes, ed note, hospitalist progress notes, psych notes last 24h vitals tele labs imaging I/O    Principal Problem:   Ischemic ulcer of lower extremity (HCC) Active Problems:   Hypertension   Coronary artery disease with history of myocardial infarction without history of CABG   Diabetes mellitus type 2, insulin dependent (HCC)   Diabetic polyneuropathy associated with type 2 diabetes mellitus (HCC)   Mixed hyperlipidemia   Alcohol abuse   Limb ischemia  Acute on chronic combined systolic and diastolic CHF (congestive heart failure) (HCC)   Adjustment disorder with depressed mood    ASSESSMENT AND PLAN:  Rommel P. 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 Bilateral LE revascularization with Dr. Wyn Quaker was planned on 7/24 but cancelled 2/2 dyspnea and heart failure symptoms. Cardiology is consulted for assistance with the patients heart failure and medical optimization prior to vascular surgery. Echo this admission showed reduced EF 25-30% with multiple rWMAs, mild to moderate MR. Now s/p bilateral common femoral, profunda femoris, SFA endarterectomies, and bilateral common iliac artery kissing expandable stents, and left external iliac stenting x 2 performed on 7/26.  Also s/p right fourth and fifth toe amputations performed 7/29.  # Severe PAD with lower extremity ulceration -Agree with current therapy per primary, vascular surgery, and podiatry -On broad-spectrum Unasyn  # Acute on chronic HFrEF Developed dyspnea/orthopnea on 7/23 and was given IV Lasix with improvement, vascular surgery intervention canceled for 7/24 due to his heart failure exacerbation. Repeat chest x-ray with trace bilateral pleural effusions, BNP was elevated in the 900s.  S/p IV Lasix  BID for multiple days with clinical improvement. D/t increased Cr, primary team discontinued diuretics from 7/28-7/30. Now with PND last evening and appears clinically hypervolemic on exam with trace crackles and worsening LE edema  -Agree with restarting IV Lasix 40 mg twice daily today  -Continue Coreg 12.5 mg twice daily -GDMT with ARB/ARNI, SGLT2i, MRA presently limited by low normal BP and AKI.  I started him on losartan and spironolactone earlier this hospitalization but these have since been discontinued due to worsening renal function.   -Stop home amlodipine 5 mg daily -We had a lengthy discussion regarding his worsening heart function and heart  failure in general, he seems somewhat reluctant to follow-up in clinic and have the burden of seeing multiple specialists to "have his life revolve around seeing a different doctor each day."  We will schedule him an appointment to follow-up with Dr. Lorelle Gibbs 1-2 weeks following hospital discharge.   This patient's plan of care was discussed and created with Dr. Darrold Junker and he is in agreement.  Signed: Rebeca Allegra , PA-C 11/20/2022, 2:11 PM [facility]

## 2022-11-21 ENCOUNTER — Inpatient Hospital Stay: Payer: Medicare Other

## 2022-11-21 DIAGNOSIS — I10 Essential (primary) hypertension: Secondary | ICD-10-CM | POA: Diagnosis not present

## 2022-11-21 DIAGNOSIS — I2510 Atherosclerotic heart disease of native coronary artery without angina pectoris: Secondary | ICD-10-CM | POA: Diagnosis not present

## 2022-11-21 DIAGNOSIS — E1142 Type 2 diabetes mellitus with diabetic polyneuropathy: Secondary | ICD-10-CM | POA: Diagnosis not present

## 2022-11-21 DIAGNOSIS — L97909 Non-pressure chronic ulcer of unspecified part of unspecified lower leg with unspecified severity: Secondary | ICD-10-CM | POA: Diagnosis not present

## 2022-11-21 LAB — GLUCOSE, CAPILLARY
Glucose-Capillary: 132 mg/dL — ABNORMAL HIGH (ref 70–99)
Glucose-Capillary: 154 mg/dL — ABNORMAL HIGH (ref 70–99)
Glucose-Capillary: 186 mg/dL — ABNORMAL HIGH (ref 70–99)
Glucose-Capillary: 138 mg/dL — ABNORMAL HIGH (ref 70–99)

## 2022-11-21 MED ORDER — FUROSEMIDE 10 MG/ML IJ SOLN
60.0000 mg | Freq: Two times a day (BID) | INTRAMUSCULAR | Status: DC
  Administered 2022-11-21 – 2022-11-26 (×11): 60 mg via INTRAVENOUS
  Filled 2022-11-21 (×11): qty 6

## 2022-11-21 NOTE — Progress Notes (Signed)
Occupational Therapy Treatment Patient Details Name: Manuel [name] MRN: [id number] DOB: 08-Oct-1945 Today's Date: 11/21/2022   History of present illness Pt is a 77 year old male presenting with right lower extremity ischemia and ulcer formation with gangrenous toe now s/p left common femoral, profunda femoris, and superficial femoral artery endarterectomies, Right common femoral, profunda femoris, and superficial femoral artery endarterectomies, stent placements in bilateral common iliac arteries, Additional stent placement x 2 to the left external iliac artery, Angioplasty of the right posterior tibial artery, Angioplasty of the right popliteal artery on 11/15/22;   Pmh significant for insulin-dependent DM, HTN, CAD, PAD s/p angioplasty with stent 05/14/2021. 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 Pt agreeable to OT intervention. Pt performs 2 sets of 5 chair push ups with rest break needed secondary to fatigue. Pt is unable to adjust darco shoe himself this session so therapist assists him with this task. Pt stands from recliner chair with min guard and ambulates 10' with RW. Pt reports increased pain and RN arriving with medication at end of session. Call bell and all needed items within reach. Pt seated in recliner chair with family returning to room to visit. Pt making excellent progress towards goals.    Recommendations for follow up therapy are one component of a multi-disciplinary discharge planning process, led by the attending physician.  Recommendations may be updated based on patient status, additional functional criteria and insurance authorization.    Assistance Recommended at Discharge Frequent or constant Supervision/Assistance  Patient can return home with the following  A lot of help with walking and/or transfers;A lot of help with bathing/dressing/bathroom;Assistance  with cooking/housework;Direct supervision/assist for medications management;Direct supervision/assist for financial management;Assist for transportation;Help with stairs or ramp for entrance   Equipment Recommendations  BSC/3in1;Other (comment) (RW)       Precautions / Restrictions Precautions Precautions: Fall Precaution Comments: B wound vacs Restrictions Weight Bearing Restrictions: Yes RLE Weight Bearing: Partial weight bearing RLE Partial Weight Bearing Percentage or Pounds: Per note from Dr. Alberteen Spindle: Pt may be partial weightbearing in his OrthoWedge surgical shoe using crutches or walker with pressure only on the heel of the shoe.  Minimal weightbearing for the first couple of weeks. Other Position/Activity Restrictions: WB thru heel in ortho wedge shoe       Mobility Bed Mobility Overal bed mobility: Needs Assistance             General bed mobility comments: seated in recliner chair    Transfers Overall transfer level: Needs assistance Equipment used: Rolling walker (2 wheels) Transfers: Sit to/from Stand Sit to Stand: Min guard, Min assist                 Balance Overall balance assessment: Needs assistance Sitting-balance support: Feet supported Sitting balance-Leahy Scale: Good     Standing balance support: Bilateral upper extremity supported, During functional activity, Reliant on assistive device for balance Standing balance-Leahy Scale: Fair                             ADL either performed or assessed with clinical judgement   ADL Overall ADL's : Needs assistance/impaired  General ADL Comments: Pt is unable to don/adjust R darco shoe himself secondary to scrotal edema    Extremity/Trunk Assessment Upper Extremity Assessment Upper Extremity Assessment: Overall WFL for tasks assessed   Lower Extremity Assessment Lower Extremity Assessment: Generalized weakness        Vision  Patient Visual Report: No change from baseline            Cognition Arousal/Alertness: Awake/alert Behavior During Therapy: Anxious Overall Cognitive Status: Within Functional Limits for tasks assessed                                                     Pertinent Vitals/ Pain       Pain Assessment Pain Assessment: 0-10 Pain Score: 8  Pain Location: RLE Pain Descriptors / Indicators: Aching, Sharp, Grimacing, Guarding, Discomfort Pain Intervention(s): Monitored during session, Repositioned, Patient requesting pain meds-RN notified, RN gave pain meds during session         Frequency  Min 1X/week        Progress Toward Goals  OT Goals(current goals can now be found in the care plan section)  Progress towards OT goals: Progressing toward goals     Plan Discharge plan remains appropriate;Frequency remains appropriate       AM-PAC OT "6 Clicks" Daily Activity     Outcome Measure   Help from another person eating meals?: None Help from another person taking care of personal grooming?: None Help from another person toileting, which includes using toliet, bedpan, or urinal?: A Lot Help from another person bathing (including washing, rinsing, drying)?: A Lot Help from another person to put on and taking off regular upper body clothing?: A Little Help from another person to put on and taking off regular lower body clothing?: A Lot 6 Click Score: 17    End of Session Equipment Utilized During Treatment: Rolling walker (2 wheels)  OT Visit Diagnosis: Other abnormalities of gait and mobility (R26.89);Unsteadiness on feet (R26.81);Muscle weakness (generalized) (M62.81)   Activity Tolerance Patient tolerated treatment well   Patient Left in bed;with call bell/phone within reach;with bed alarm set   Nurse Communication Mobility status        Time: 4098-1191 OT Time Calculation (min): 23 min  Charges: OT General Charges $OT Visit: 1 Visit OT  Treatments $Therapeutic Activity: 8-22 mins $Therapeutic Exercise: 8-22 mins  Jackquline Denmark, MS, OTR/L , CBIS ascom [phone]  11/21/22, 3:37 PM

## 2022-11-21 NOTE — Plan of Care (Signed)
  Problem: Health Behavior/Discharge Planning: Goal: Ability to manage health-related needs will improve Outcome: Progressing   Problem: Activity: Goal: Risk for activity intolerance will decrease Outcome: Progressing   Problem: Nutrition: Goal: Adequate nutrition will be maintained Outcome: Progressing   Problem: Pain Managment: Goal: General experience of comfort will improve Outcome: Progressing   Problem: Safety: Goal: Ability to remain free from injury will improve Outcome: Progressing   

## 2022-11-21 NOTE — Progress Notes (Signed)
Physical Therapy Treatment Patient Details Name: Manuel [name] MRN: [id number] DOB: [birth year] Today's Date: 11/21/2022   History of Present Illness Pt is a 77 year old male presenting with right lower extremity ischemia and ulcer formation with gangrenous toe now s/p left common femoral, profunda femoris, and superficial femoral artery endarterectomies, Right common femoral, profunda femoris, and superficial femoral artery endarterectomies, stent placements in bilateral common iliac arteries, Additional stent placement x 2 to the left external iliac artery, Angioplasty of the right posterior tibial artery, Angioplasty of the right popliteal artery on 11/15/22;   Pmh significant for insulin-dependent DM, HTN, CAD, PAD s/p angioplasty with stent 05/14/2021. 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 Patient in bed on arrival on 2L O2. Removed O2 for mobility with patient's spO2 >93% throughout. Required min guard for all mobility with use of RW. Cues required for heel WB on R and to utilize Rw to offweight R LE during mobility. Wife present and supportive. Remained off of O2 at end of session. Tangential in conversation throughout. Discharge plan remains appropriate.      If plan is discharge home, recommend the following: A little help with walking and/or transfers;A little help with bathing/dressing/bathroom;Assistance with cooking/housework;Assist for transportation;Help with stairs or ramp for entrance   Can travel by private vehicle        Equipment Recommendations  Rolling Cherye Gaertner (2 wheels);BSC/3in1    Recommendations for Other Services       Precautions / Restrictions Precautions Precautions: Fall Precaution Comments: B wound vacs Restrictions Weight Bearing Restrictions: Yes RLE Weight Bearing: Partial weight bearing Other Position/Activity Restrictions: WB thru heel in ortho wedge shoe      Mobility  Bed Mobility Overal bed mobility: Needs Assistance Bed Mobility: Supine to Sit     Supine to sit: Min guard     General bed mobility comments: increased time to complete. Reaching for bed rail and therapist hand to assist    Transfers Overall transfer level: Needs assistance Equipment used: Rolling Neilah Fulwider (2 wheels) Transfers: Sit to/from Stand Sit to Stand: Min guard, From elevated surface           General transfer comment: cues for hand placement    Ambulation/Gait Ambulation/Gait assistance: Min guard Gait Distance (Feet): 15 Feet Assistive device: Rolling Anner Baity (2 wheels) Gait Pattern/deviations: Step-to pattern Gait velocity: decreased     General Gait Details: min guard for safety. Cues for heel WB on R and use of UEs on RW to offweight R LE   Stairs             Wheelchair Mobility     Tilt Bed    Modified Rankin (Stroke Patients Only)       Balance Overall balance assessment: Needs assistance Sitting-balance support: Feet supported Sitting balance-Leahy Scale: Good     Standing balance support: Bilateral upper extremity supported, During functional activity, Reliant on assistive device for balance Standing balance-Leahy Scale: Fair                              Cognition Arousal/Alertness: Awake/alert Behavior During Therapy: Anxious Overall Cognitive Status: Within Functional Limits for tasks assessed                                 General Comments:  tangential in conversation throughout session        Exercises      General Comments        Pertinent Vitals/Pain Pain Assessment Pain Assessment: Faces Faces Pain Scale: No hurt Pain Intervention(s): Monitored during session    Home Living                          Prior Function            PT Goals (current goals can now be found in the care plan section) Acute Rehab PT Goals Patient Stated Goal: return home PT Goal  Formulation: With patient Time For Goal Achievement: 12/03/22 Potential to Achieve Goals: Good Progress towards PT goals: Progressing toward goals    Frequency    Min 1X/week      PT Plan Current plan remains appropriate    Co-evaluation              AM-PAC PT "6 Clicks" Mobility   Outcome Measure  Help needed turning from your back to your side while in a flat bed without using bedrails?: None Help needed moving from lying on your back to sitting on the side of a flat bed without using bedrails?: A Little Help needed moving to and from a bed to a chair (including a wheelchair)?: A Little Help needed standing up from a chair using your arms (e.g., wheelchair or bedside chair)?: A Little Help needed to walk in hospital room?: A Little Help needed climbing 3-5 steps with a railing? : Total 6 Click Score: 17    End of Session   Activity Tolerance: Patient tolerated treatment well Patient left: in chair;with call bell/phone within reach;with family/visitor present Nurse Communication: Mobility status PT Visit Diagnosis: Other abnormalities of gait and mobility (R26.89);Muscle weakness (generalized) (M62.81);Unsteadiness on feet (R26.81);Difficulty in walking, not elsewhere classified (R26.2)     Time: 4098-1191 PT Time Calculation (min) (ACUTE ONLY): 29 min  Charges:    $Gait Training: 8-22 mins $Therapeutic Activity: 8-22 mins PT General Charges $$ ACUTE PT VISIT: 1 Visit                     Maylon Peppers, PT, DPT Physical Therapist - [facility]  [facility]    Jex Strausbaugh A Dreshon Proffit 11/21/2022, 2:49 PM

## 2022-11-21 NOTE — TOC Progression Note (Addendum)
Transition of Care [facility]) - Progression Note    Patient Details  Name: Manuel [name] MRN: [id number] Date of Birth: 03/28/1946  Transition of Care [facility]) CM/SW Contact  Darolyn Rua, [place] Phone Number: 11/21/2022, 11:02 AM  Clinical Narrative:     CSW spoke with patient's spouse regarding home health recommendations, she's in agreement with no preference of agency.   She reports they have all dme at home including RW, 3in1, shower chair, wheel chair and a ramp. She reports preferrered pharmacy is [facility] in [place] or [facility].   She reports patient continues to see Dr. Letitia Libra.   No additional needs at this time.   HH PT, OT, and RN referral sent to [facility].   Per MD and surgery, prevena wound vacs to go in trash 8/5, no hh RN needs.   Expected Discharge Plan: Home w Home Health Services    Expected Discharge Plan and Services                                               Social Determinants of Health (SDOH) Interventions SDOH Screenings   Food Insecurity: No Food Insecurity (11/10/2022)  Housing: Low Risk  (11/10/2022)  Transportation Needs: No Transportation Needs (11/10/2022)  Utilities: Not At Risk (11/10/2022)  Tobacco Use: Low Risk  (11/18/2022)  Recent Concern: Tobacco Use - Medium Risk (09/09/2022)   Received from [facility], [facility]    Readmission Risk Interventions     No data to display

## 2022-11-21 NOTE — Progress Notes (Signed)
[facility] CLINIC CARDIOLOGY CONSULT NOTE       Patient ID: Manuel [name] MRN: [id number] DOB/AGE: September 28, 1945 77 y.o.  Admit date: 11/10/2022 Referring Physician Dr. Arnetha Courser  Primary Physician Dr. Marcelino Duster  Primary Cardiologist Dr. 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 Bilateral LE revascularization with Dr. Wyn Quaker was planned on 7/24 but cancelled 2/2 dyspnea and heart failure symptoms. Cardiology is consulted for assistance with the patients heart failure and medical optimization prior to vascular surgery. Echo this admission showed reduced EF 25-30% with multiple rWMAs, mild to moderate MR. Now s/p bilateral common femoral, profunda femoris, SFA endarterectomies, and bilateral common iliac artery kissing expandable stents, and left external iliac stenting x 2 performed on 7/26.  Also s/p right fourth and fifth toe amputations performed 7/29.  Interval History:  -Remains with orthopnea and peripheral edema, placed on supplemental oxygen this a.m. due to dyspnea.  -Progressive scrotal swelling and tenderness which is the patient's main concern today -No chest pain, heart racing or palpitations -Renal function stable today  Review of systems complete and found to be negative unless listed above     Past Medical History:  Diagnosis Date   Allergies    Arthritis    Diabetes mellitus without complication (HCC)    Gout    Hard of hearing    Hyperlipidemia    Hypertension    Myocardial infarction [facility]) 2008   treated here at [facility] and transferred to [place] for stent placement    Neuropathy    knees down   Peripheral vascular disease (HCC)    Sleep apnea    "in the past"   Status post primary angioplasty with coronary stent    Wears dentures    full upper and lower    Past Surgical History:  Procedure Laterality Date   AMPUTATION Right 11/18/2022   Procedure: AMPUTATION 4TH AND 5TH RAY;  Surgeon: Linus Galas, [name];  Location: [facility] ORS;  Service: Orthopedics/Podiatry;  Laterality: Right;  4th and 5th toe   CATARACT EXTRACTION W/PHACO Right 03/14/2021   Procedure: CATARACT EXTRACTION PHACO AND INTRAOCULAR LENS PLACEMENT (IOC) RIGHT DIABETIC;  Surgeon: Lockie Mola, MD;  Location: [facility];  Service: Ophthalmology;  Laterality: Right;  Diabetic 16.78 01:39.9   CATARACT EXTRACTION W/PHACO Left 03/28/2021   Procedure: CATARACT EXTRACTION PHACO AND INTRAOCULAR LENS PLACEMENT (IOC) LEFT DIABETIC 6.93 01:22.0;  Surgeon: Lockie Mola, MD;  Location: [facility];  Service: Ophthalmology;  Laterality: Left;  Diabetic   ENDARTERECTOMY FEMORAL Bilateral 11/15/2022   Procedure: BILATERAL COMMON FEMORAL PROFUNDA FEMORIS AND SUPERFICIAL FEMORAL ARTERY ENDARTECTOMIES, RIGHT FOGARTY EMBOLECTOMY OF THE RIGHT SFA  AND  POPLITEAL ARTERIES. AORTAGRAM AND RIGHT LOWER EXTREMITY ANGIOGRAM.;  Surgeon: Annice Needy, MD;  Location: [facility] ORS;  Service: Vascular;  Laterality: Bilateral;   INSERTION OF ILIAC STENT Bilateral 11/15/2022   Procedure: BILATERAL STENT INSERTION IN BILATERAL  COMMON ILIAC ARTERY, STENT INSERTION OF LEFT EXTERNAL ILIAC ARTERY. ANGIOPLASTY RIGHT TIBIAL  AND POPLITEAL ARTERY.;  Surgeon: Annice Needy, MD;  Location: [facility] ORS;  Service: Vascular;  Laterality: Bilateral;   LOWER EXTREMITY ANGIOGRAPHY Right  05/14/2021   Procedure: LOWER EXTREMITY ANGIOGRAPHY;  Surgeon: Annice Needy, MD;  Location: [facility] INV[facility];  Service: Cardiovascular;  Laterality: Right;   LOWER EXTREMITY ANGIOGRAPHY Right 06/14/2021   Procedure: Lower Extremity  Angiography;  Surgeon: Annice Needy, MD;  Location: [facility] INVASIVE CV LAB;  Service: Cardiovascular;  Laterality: Right;   LOWER EXTREMITY ANGIOGRAPHY Right 11/11/2022   Procedure: Lower Extremity Angiography;  Surgeon: Annice Needy, MD;  Location: [facility] INVASIVE CV LAB;  Service: Cardiovascular;  Laterality: Right;   LOWER EXTREMITY INTERVENTION Right 06/15/2021   Procedure: LOWER EXTREMITY INTERVENTION;  Surgeon: Annice Needy, MD;  Location: [facility] INVASIVE CV LAB;  Service: Cardiovascular;  Laterality: Right;   TONSILLECTOMY     had removed as a child    Medications Prior to Admission  Medication Sig Dispense Refill Last Dose   amLODipine (NORVASC) 10 MG tablet Take 10 mg by mouth daily.   Past Week   carvedilol (COREG) 12.5 MG tablet Take 1 tablet by mouth 2 (two) times daily.   Past Week   clopidogrel (PLAVIX) 75 MG tablet Take 1 tablet (75 mg total) by mouth daily. 90 tablet 1 Past Week   gabapentin (NEURONTIN) 400 MG capsule Take 800 mg by mouth 2 (two) times daily.   Past Week   insulin aspart protamine- aspart (NOVOLOG MIX 70/30) (70-30) 100 UNIT/ML injection Inject 32 Units into the skin 2 (two) times daily with a meal.   Past Week   lisinopril (ZESTRIL) 20 MG tablet Take 1 tablet (20 mg total) by mouth daily. 30 tablet 1 Past Week   apixaban (ELIQUIS) 5 MG TABS tablet Take 1 tablet (5 mg total) by mouth 2 (two) times daily. (Patient not taking: Reported on 01/25/2022) 60 tablet 1 Not Taking   aspirin EC 81 MG tablet Take 1 tablet (81 mg total) by mouth daily. (Patient not taking: Reported on 08/13/2022) 150 tablet 2 Not Taking   atorvastatin (LIPITOR) 80 MG tablet Take 1 tablet (80 mg total) by mouth daily. (Patient not taking: Reported on 11/11/2022) 30 tablet 1 Not Taking   cefdinir (OMNICEF) 300 MG capsule Take 300 mg by mouth 2 (two) times daily. (Patient not taking: Reported on 11/11/2022)   Not Taking   DULoxetine (CYMBALTA) 30 MG capsule Take 30 mg by mouth daily. (Patient not taking:  Reported on 07/11/2021)      fluticasone (FLONASE) 50 MCG/ACT nasal spray Place 2 sprays into both nostrils as needed for allergies or rhinitis. (Patient not taking: Reported on 07/11/2021)      gabapentin (NEURONTIN) 100 MG capsule Take 100 mg by mouth at bedtime. (Patient not taking: Reported on 11/11/2022)   Not Taking   Ibuprofen 200 MG CAPS Take 2 tablets by mouth 3 times/day as needed-between meals & bedtime. (Patient not taking: Reported on 11/11/2022)   Not Taking   levocetirizine (XYZAL) 5 MG tablet Take 5 mg by mouth at bedtime. (Patient not taking: Reported on 08/13/2022)   Not Taking   polyethylene glycol (MIRALAX / GLYCOLAX) 17 g packet Take 17 g by mouth daily. (Patient not taking: Reported on 08/13/2022) 14 each 0    senna-docusate (SENOKOT-S) 8.6-50 MG tablet Take 1 tablet by mouth at bedtime as needed for mild constipation. 30 tablet 0 prn   zolpidem (AMBIEN) 5 MG tablet Take 1 tablet (5 mg total) by mouth at bedtime as needed for up to 5 doses for sleep. (Patient not taking: Reported on 07/11/2021) 5 tablet 0 Not Taking  Social History   Socioeconomic History   Marital status: Married    Spouse name: Not on file   Number of children: Not on file   Years of education: Not on file   Highest education level: Not on file  Occupational History   Not on file  Tobacco Use   Smoking status: Never   Smokeless tobacco: Never  Vaping Use   Vaping status: Never Used  Substance and Sexual Activity   Alcohol use: Yes    Alcohol/week: 21.0 standard drinks of alcohol    Types: 7 Glasses of wine, 14 Cans of beer per week    Comment: occassional   Drug use: No   Sexual activity: Not on file  Other Topics Concern   Not on file  Social History Narrative   Not on file   Social Determinants of Health   Financial Resource Strain: Not on file  Food Insecurity: No Food Insecurity (11/10/2022)   Hunger Vital Sign    Worried About Running Out of Food in the Last Year: Never true    Ran Out  of Food in the Last Year: Never true  Transportation Needs: No Transportation Needs (11/10/2022)   PRAPARE - Administrator, Civil Service (Medical): No    Lack of Transportation (Non-Medical): No  Physical Activity: Not on file  Stress: Not on file  Social Connections: Not on file  Intimate Partner Violence: Not At Risk (11/10/2022)   Humiliation, Afraid, Rape, and Kick questionnaire    Fear of Current or Ex-Partner: No    Emotionally Abused: No    Physically Abused: No    Sexually Abused: No    Family History  Problem Relation Age of Onset   Scoliosis Mother    Heart disease Father       Intake/Output Summary (Last 24 hours) at 11/21/2022 1231 Last data filed at 11/21/2022 1100 Gross per 24 hour  Intake 1560 ml  Output 1350 ml  Net 210 ml    Vitals:   11/20/22 2318 11/21/22 0335 11/21/22 0747 11/21/22 0905  BP: (!) 124/58 124/68 132/68   Pulse: (!) 54 (!) 55 (!) 57 60  Resp: 16 18 16 18   Temp: 97.6 F (36.4 C) 97.6 F (36.4 C) (!) 97.5 F (36.4 C)   TempSrc: Oral Oral    SpO2: 100% 100% 99% 96%  Weight:      Height:        PHYSICAL EXAM General: Pleasant conversational elderly male, well nourished, in no acute distress.  Sitting in recliner, with legs elevated. HEENT:  Normocephalic and atraumatic. Neck:  No JVD.  Lungs: Normal respiratory effort on supplemental O2 trace bibasilar crackles without appreciable wheezes.   Heart: HRRR . Normal S1 and S2 without gallops or murmurs.  Abdomen: Non-distended appearing adiposity.  Groin: Significant swelling to the scrotum with tenderness to palpation, without erythema or warmth Msk: Normal strength and tone for age. Extremities: 1-2+ bilateral lower extremity edema. surgical shoe and dressing applied to right lower extremity.   Neuro: Alert and oriented X 3. Psych:  Answers questions appropriately.   Labs: Basic Metabolic Panel: Recent Labs    11/20/22 1225 11/21/22 0720  NA 130* 131*  K 4.6 4.1  CL  98 97*  CO2 24 21*  GLUCOSE 153* 143*  BUN 55* 57*  CREATININE 1.99* 1.82*  CALCIUM 8.2* 8.2*   Liver Function Tests: No results for input(s): "AST", "ALT", "ALKPHOS", "BILITOT", "PROT", "ALBUMIN" in the last 72 hours. No  results for input(s): "LIPASE", "AMYLASE" in the last 72 hours. CBC: Recent Labs    11/19/22 0658  WBC 8.2  HGB 7.8*  HCT 23.0*  MCV 89.8  PLT 273   Cardiac Enzymes: No results for input(s): "CKTOTAL", "CKMB", "CKMBINDEX", "TROPONINIHS" in the last 72 hours. BNP: No results for input(s): "BNP" in the last 72 hours.  D-Dimer: No results for input(s): "DDIMER" in the last 72 hours. Hemoglobin A1C: No results for input(s): "HGBA1C" in the last 72 hours. Fasting Lipid Panel: No results for input(s): "CHOL", "HDL", "LDLCALC", "TRIG", "CHOLHDL", "LDLDIRECT" in the last 72 hours. Thyroid Function Tests: No results for input(s): "TSH", "T4TOTAL", "T3FREE", "THYROIDAB" in the last 72 hours.  Invalid input(s): "FREET3" Anemia Panel: No results for input(s): "VITAMINB12", "FOLATE", "FERRITIN", "TIBC", "IRON", "RETICCTPCT" in the last 72 hours.   Radiology: DG C-Arm 1-60 Min-No Report  Result Date: 11/15/2022 Fluoroscopy was utilized by the requesting physician.  No radiographic interpretation.   ECHOCARDIOGRAM COMPLETE  Result Date: 11/14/2022    ECHOCARDIOGRAM REPORT   Patient Name:   Manuel [name] Date of Exam: 11/14/2022 Medical Rec #:  [id number]        Height:       68.0 in Accession #:    [id number]       Weight:       191.6 lb Date of Birth:  07-05-1945        BSA:          2.007 m Patient Age:    64 years         BP:           151/83 mmHg Patient Gender: M                HR:           88 bpm. Exam Location:  [facility] Procedure: 2D Echo, Cardiac Doppler, Color Doppler and Strain Analysis Indications:     Dyspnea R06.00  History:         Patient has no prior history of Echocardiogram examinations.                  Previous Myocardial Infarction; Risk  Factors:Hypertension and                  Diabetes.  Sonographer:     Cristela Blue Referring Phys:  [id number] Tonna Corner MICHELLE Davion Flannery Diagnosing Phys: Manuel Millard MD  Sonographer Comments: Global longitudinal strain was attempted. IMPRESSIONS  1. Left ventricular ejection fraction, by estimation, is 25 to 30%. The left ventricle has severely decreased function. The left ventricle demonstrates regional wall motion abnormalities (see scoring diagram/findings for description). Left ventricular diastolic parameters were normal.  2. Right ventricular systolic function is normal. The right ventricular size is normal.  3. The mitral valve is normal in structure. Mild to moderate mitral valve regurgitation. No evidence of mitral stenosis.  4. Tricuspid valve regurgitation is mild to moderate.  5. The aortic valve is normal in structure. Aortic valve regurgitation is not visualized. No aortic stenosis is present.  6. The inferior vena cava is normal in size with greater than 50% respiratory variability, suggesting right atrial pressure of 3 mmHg. FINDINGS  Left Ventricle: Left ventricular ejection fraction, by estimation, is 25 to 30%. The left ventricle has severely decreased function. The left ventricle demonstrates regional wall motion abnormalities. The left ventricular internal cavity size was normal  in size. There is no left ventricular hypertrophy. Left ventricular diastolic parameters were normal.  LV Wall Scoring: The basal anteroseptal segment, apical lateral segment, apical septal segment, and apex are akinetic. The mid anterolateral segment is hypokinetic. Right Ventricle: The right ventricular size is normal. No increase in right ventricular wall thickness. Right ventricular systolic function is normal. Left Atrium: Left atrial size was normal in size. Right Atrium: Right atrial size was normal in size. Pericardium: There is no evidence of pericardial effusion. Mitral Valve: The mitral valve is normal in  structure. Mild to moderate mitral valve regurgitation. No evidence of mitral valve stenosis. Tricuspid Valve: The tricuspid valve is normal in structure. Tricuspid valve regurgitation is mild to moderate. No evidence of tricuspid stenosis. Aortic Valve: The aortic valve is normal in structure. Aortic valve regurgitation is not visualized. No aortic stenosis is present. Aortic valve mean gradient measures 2.0 mmHg. Aortic valve peak gradient measures 4.2 mmHg. Aortic valve area, by VTI measures 2.55 cm. Pulmonic Valve: The pulmonic valve was normal in structure. Pulmonic valve regurgitation is not visualized. No evidence of pulmonic stenosis. Aorta: The aortic root is normal in size and structure. Venous: The inferior vena cava is normal in size with greater than 50% respiratory variability, suggesting right atrial pressure of 3 mmHg. IAS/Shunts: No atrial level shunt detected by color flow Doppler.  LEFT VENTRICLE PLAX 2D LVIDd:         4.60 cm      Diastology LVIDs:         4.00 cm      LV e' medial:    3.70 cm/s LV PW:         1.00 cm      LV E/e' medial:  30.3 LV IVS:        1.30 cm      LV e' lateral:   6.09 cm/s LVOT diam:     2.00 cm      LV E/e' lateral: 18.4 LV SV:         44 LV SV Index:   22 LVOT Area:     3.14 cm  LV Volumes (MOD) LV vol d, MOD A2C: 156.0 ml LV vol d, MOD A4C: 191.0 ml LV vol s, MOD A2C: 119.0 ml LV vol s, MOD A4C: 134.0 ml LV SV MOD A2C:     37.0 ml LV SV MOD A4C:     191.0 ml LV SV MOD BP:      41.9 ml RIGHT VENTRICLE RV Basal diam:  4.40 cm RV Mid diam:    3.50 cm LEFT ATRIUM             Index        RIGHT ATRIUM           Index LA diam:        3.90 cm 1.94 cm/m   RA Area:     12.60 cm LA Vol (A2C):   68.0 ml 33.89 ml/m  RA Volume:   28.00 ml  13.95 ml/m LA Vol (A4C):   57.5 ml 28.65 ml/m LA Biplane Vol: 63.6 ml 31.69 ml/m  AORTIC VALVE AV Area (Vmax):    2.43 cm AV Area (Vmean):   2.51 cm AV Area (VTI):     2.55 cm AV Vmax:           102.00 cm/s AV Vmean:          67.500  cm/s AV VTI:            0.174 m AV Peak Grad:  4.2 mmHg AV Mean Grad:      2.0 mmHg LVOT Vmax:         79.00 cm/s LVOT Vmean:        53.900 cm/s LVOT VTI:          0.141 m LVOT/AV VTI ratio: 0.81  AORTA Ao Root diam: 3.50 cm MITRAL VALVE MV Area (PHT): 5.02 cm     SHUNTS MV Decel Time: 151 msec     Systemic VTI:  0.14 m MV E velocity: 112.00 cm/s  Systemic Diam: 2.00 cm MV A velocity: 79.10 cm/s MV E/A ratio:  1.42 Manuel Millard MD Electronically signed by Manuel Millard MD Signature Date/Time: 11/14/2022/1:14:40 PM    Final    DG Chest Port 1 View  Result Date: 11/12/2022 CLINICAL DATA:  Shortness of breath EXAM: PORTABLE CHEST 1 VIEW COMPARISON:  Prior chest x-ray 11/09/2021 FINDINGS: Diffuse irregular interstitial prominence with areas of nodularity throughout the lungs. Small bilateral pleural effusions and associated bibasilar atelectasis. Cardiac and mediastinal contours are within normal limits. Atherosclerotic calcifications are visible in the transverse aorta. No pneumothorax. No acute osseous abnormality. Bilateral glenohumeral joint osteoarthritis. IMPRESSION: Diffuse interstitial prominence with areas of nodularity throughout both lungs. Differential considerations include an acute atypical infectious/inflammatory process such as viral pneumonia versus atypical pulmonary edema. Trace bilateral pleural effusions and associated bibasilar atelectasis. Normal heart size. Electronically Signed   By: Malachy Moan M.D.   On: 11/12/2022 13:39   PERIPHERAL VASCULAR CATHETERIZATION  Result Date: 11/11/2022 See surgical note for result.  DG Foot Complete Right  Result Date: 11/10/2022 CLINICAL DATA:  Toe ulcers.  Evaluate for osteomyelitis. EXAM: RIGHT FOOT COMPLETE - 3+ VIEW COMPARISON:  None Available. FINDINGS: There are no signs of acute fracture or dislocation. No focal bone erosions of osteomyelitis identified. Mild hallux valgus deformity with mild degenerative changes at the  first MTP joint. Mild dorsal soft tissue edema. IMPRESSION: 1. No acute findings. No signs of osteomyelitis. 2. 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 Electronically Signed   By: Signa Kell M.D.   On: 11/10/2022 10:00    ECHO 11/10/2018 MILD LV SYSTOLIC DYSFUNCTION WITH AN ESTIMATED EF = 40-45 %  NORMAL RIGHT VENTRICULAR SYSTOLIC FUNCTION  MILD-TO-MODERATE MITRAL VALVE INSUFFICIENCY  TRACE TRICUSPID VALVE INSUFFICIENCY  NO VALVULAR STENOSIS  INFERIOR WALL HYPOKINESIS  _________________________________________________________________________________________  Electronically signed by      MD Arnoldo Hooker on 11/10/2018 10: 43 AM           Performed By: Luretha Murphy, RDCS, RVT     Ordering Physician: [name], MD Velna Ochs myoview 11/10/2018 Normal Lexiscan infusion EKG  Mild segmental LV systolic dysfunction with inferior hypokinesis  Fixed and minimal reversible inferior myocardial perfusion defect  consistent with mainly previous infarct with peri-infarct myocardial  ischemia  Clinical correlation advised   Arnoldo Hooker   TELEMETRY reviewed by me (LT) 11/21/2022 : Sinus bradycardia rate low 50s to NSR mid 60s  EKG reviewed by me: NSR 70, nonspec IVCD, hx old inferior infarct  Data reviewed by me (LT) 11/21/2022: vascular surgery notes, ed note, hospitalist progress notes, psych notes last 24h vitals tele labs imaging I/O    Principal Problem:   Ischemic ulcer of lower extremity (HCC) Active Problems:   Hypertension   Coronary artery disease with history of myocardial infarction without history of CABG   Diabetes mellitus type 2, insulin dependent (HCC)   Diabetic polyneuropathy associated with type 2 diabetes mellitus (HCC)  Mixed hyperlipidemia   Alcohol abuse   Limb ischemia   Acute on chronic combined systolic and diastolic CHF (congestive heart failure) (HCC)   Adjustment disorder with depressed mood    ASSESSMENT AND PLAN:   Kyrel P. 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 Bilateral LE revascularization with Dr. Wyn Quaker was planned on 7/24 but cancelled 2/2 dyspnea and heart failure symptoms. Cardiology is consulted for assistance with the patients heart failure and medical optimization prior to vascular surgery. Echo this admission showed reduced EF 25-30% with multiple rWMAs, mild to moderate MR. Now s/p bilateral common femoral, profunda femoris, SFA endarterectomies, and bilateral common iliac artery kissing expandable stents, and left external iliac stenting x 2 performed on 7/26.  Also s/p right fourth and fifth toe amputations performed 7/29.  # Severe PAD with lower extremity ulceration -Agree with current therapy per primary, vascular surgery, and podiatry -On broad-spectrum Unasyn  # Acute on chronic HFrEF Developed dyspnea/orthopnea on 7/23 and was given IV Lasix with improvement, vascular surgery intervention canceled for 7/24 due to his heart failure exacerbation. Repeat chest x-ray with trace bilateral pleural effusions, BNP was elevated in the 900s.  S/p IV Lasix  BID for multiple days with clinical improvement. D/t increased Cr, primary team discontinued diuretics from 7/28-7/30.  On 8/1: Continued hypervolemia on exam with scrotal edema, and 1-2+ pitting edema bilaterally and an oxygen requirement this morning. -Increase IV Lasix from 40 mg to 60 mg twice daily.  Likely discharge on a daily loop diuretic (20 mg torsemide daily) -Stressed the importance of complete cessation from alcohol and limiting salt intake -Scrotal U/S pending -Continue Coreg 12.5 mg twice daily -GDMT  with ARB/ARNI, SGLT2i, MRA presently limited by low normal BP and AKI.  I started him on losartan and spironolactone earlier this hospitalization but these have since been discontinued due to worsening renal function.   -Stop home amlodipine 5 mg daily -We had a lengthy discussion regarding his worsening heart function and heart failure in general, he seems somewhat reluctant to follow-up in clinic and have the burden of seeing multiple specialists to "have his life revolve around seeing a different doctor each day."  We will schedule him an appointment to follow-up with Dr. Lorelle Gibbs 1-2 weeks following hospital discharge.   This patient's plan of care was discussed and created with Dr. Darrold Junker and he is in agreement.  Signed: Rebeca Allegra , PA-C 11/21/2022, 12:31 PM [facility]

## 2022-11-21 NOTE — Progress Notes (Signed)
Progress Note   Patient: Manuel [name] NFA:[id number] DOB: [birth year] DOA: 11/10/2022     11 DOS: the patient was seen and examined on 11/21/2022   Brief hospital course: Taken from H&P.   Manuel [name] is a 77 y.o. male with medical history significant of  insulin-dependent DM, HTN, CAD, PAD s/p angioplasty with stent 05/14/2021 presenting with right lower extremity ischemia and ulcer formation with gangrenous toe.  Presented to the ER afebrile, hemodynamically stable. White count 11.2, hemoglobin 15.2, platelets 303, lactate 1.2, creatinine 0.9. T. bili 1.5. Plain films of the right foot with no signs of osteomyelitis. Manuel [name] with vascular surgery initially consulted with recommendation for heparin drip. Dr. [name] with podiatry also consulted for further evaluation.   7/22: Vital stable.  Going for lower extremity angiography and possible angioplasty with vascular today.  CRP elevated at 10.1, decreased prealbumin at 15 and normal ESR.  Preliminary blood cultures negative.  Continue current broad-spectrum antibiotics.  Found to have very extensive disease and most likely will required bilateral femoral endarterectomies and bilateral iliac stents.  Vascular is looking for a OR time for this extensive procedure which will require 4 to 5 hours.  7/23: Vital and labs stable.  Overnight some shortness of breath and crackles.  One-time 40 mg of IV Lasix was given.  Chest x-ray ordered but patient refused. Patient had a choking spell on meds this morning, continued to feel something sticking in his chest.  No hypoxia.  Chest x-ray with diffuse interstitial prominence and areas of nodularity throughout the both lungs.  Differential include atypical inflammatory process versus atypical pulmonary edema.  Ordered 1 dose of IV Lasix Going for his extensive vascular procedure tomorrow.  7/24: Mildly elevated blood pressure.  BNP elevated at 932, surgery got canceled for concern of acute on chronic  heart failure and patient now need cardiology clearance before proceeding. Family concern of suicidal thoughts, psych was consulted.  7/25: Hemodynamically stable.  Will echocardiogram with EF of 25 to 30% with some regional wall motion abnormalities.  Likely weak going for vascular surgery tomorrow, pending final recommendations from cardiology. Psychiatry with no suicidal thoughts.  7/26: Hemodynamically stable.  Going for vascular procedure today.  7/27: Vital stable.  Patient underwent extensive vascular procedure which involved bilateral lower extremity endarterectomies and multiple stents placement.  Pulses present with Doppler this morning.  Significant blood loss during the procedure, hemoglobin decreased to 11.5 this morning, baseline of 13.  Started on supplement.  7/28: Vital stable.  Labs with increase in creatinine to 1.31-holding Lasix today.  Hemoglobin further decreased to 9.4.  Likely be going for toe amputation tomorrow.  Arterial line removed.  7/29: Going for toes removal today.  Slight worsening of renal function today, holding diuretic and giving some IV fluid as patient is n.p.o.  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  Hemoglobin decreased to 7.8 postsurgically. Patient will be going home with 2 wound VAC in place in his groin areas from vascular surgery and they will remove it in the clinic on 11/25/2022.  Repeat PT and OT evaluation pending but he would like to go home with home health.  If remains stable likely can be discharged tomorrow. Heparin infusion is being discontinued and patient will be started on aspirin and Plavix per vascular surgery.  7/31: Hemodynamically stable.  Physical therapy recommendation remained for home health, which was ordered.  Unasyn can be switched to Augmentin on discharge for 1 more week per podiatry.  Appears volume up again today, worsening renal function and lower extremity edema, remained on room air.  Restarting IV Lasix.   Holding discharge until volume status improves.  8/1: Still having shortness of breath, orthopnea and scrotal swelling.  Assessment and Plan: * Ischemic ulcer of lower extremity (HCC) Worsening right lower extremity redness swelling and pain over the past 2 weeks in the setting of baseline limb ischemia status post angioplasty and stenting January 2023.Noted worsening ulcer formation across multiple digits of the foot with concern for gangrene Patient reports compliance with Plavix and statin therapy Started on heparin drip in the ER  Vascular procedure with extensive peripheral vascular disease, second procedure for bilateral endarterectomies and bilateral iliac stent placement with vascular surgery was done yesterday, tolerated the procedure well.  -Continue Augmentin -S/p fourth and fifth toe amputation on 7/29. -Heparin was discontinued on 7/30 and he was started on aspirin and Plavix per vascular surgery He has bilateral Prevena wound vacs that need to be removed 10 days post op. on 11/25/22. They just get removed and thrown in the trash (he entire unit) per vascular surgery.   Acute on chronic combined systolic and diastolic CHF (congestive heart failure) (HCC) According to a prior echo done in 2020, EF of 40 to 45%. Patient with concern of shortness of breath, mild hypoxia and chest x-ray with bilateral opacities.  BNP elevated at 932. Repeat echocardiogram with further decrease of EF to 20 to 25% with regional wall motion abnormalities. Becoming volume overloaded again with slight worsening of renal function as Lasix was held for the past couple of days.  He has orthopnea and PND.  Also has scrotal swelling.  Scrotal ultrasound performed and is within normal limit. -Increase Lasix to 60 mg IV twice daily per cardiology -Daily weight and BMP -Strict intake and output Net IO Since Admission: 1,491.89 mL [11/21/22 1640]   Diabetic polyneuropathy associated with type 2 diabetes mellitus  (HCC) Cont neurontin   Coronary artery disease with history of myocardial infarction without history of CABG Followed by Dr. [name] outpt  CAD s/p PCI to [facility] with overlapping 3 mm Xience stents of 23 mm and 12 mm length in 2011  No active chest pain Continue aspirin and Plavix  Hypertension Patient required nitro GGT after the procedure which was now being tapered off Titrate home regimen  Arterial line removed   Diabetes mellitus type 2, insulin dependent (HCC) SSI  Alcohol abuse 3-4 beer daily drinker + mixed drinks  Continue CIWA protocol  Follow   Mixed hyperlipidemia Continue statin  Adjustment disorder with depressed mood Family was concerned about some suicidal thoughts so psychiatry was consulted. They cleared him as patient denies any suicidal thoughts, stating that he was just frustrated     Subjective: Reports shortness of breath, orthopnea and gassy feeling.  Also has scrotal swelling  Physical Exam: Vitals:   11/21/22 0335 11/21/22 0747 11/21/22 0905 11/21/22 1233  BP: 124/68 132/68  (!) 110/50  Pulse: (!) 55 (!) 57 60 (!) 56  Resp: 18 16 18 16   Temp: 97.6 F (36.4 C) (!) 97.5 F (36.4 C)  98 F (36.7 C)  TempSrc: Oral     SpO2: 100% 99% 96% 100%  Weight:      Height:       General.  Frail elderly man, in no acute distress. Pulmonary.  Lungs clear bilaterally, normal respiratory effort. CV.  Regular rate and rhythm, no JVD, rub or murmur. Abdomen.  Soft, distended, hypoactive bowel  sounds CNS.  Alert and oriented .  No focal neurologic deficit. Extremities.  3+ LE edema, no cyanosis, palpable pulses on left, Prevena in place, has scrotal swelling Psychiatry.  Judgment and insight appears normal.    Data Reviewed: Prior data reviewed  Family Communication: No family at bedside Disposition: Status is: Inpatient Remains inpatient appropriate because: Severity of illness  Planned Discharge Destination:  Home   Time spent: 35  minutes  This record has been created using Conservation officer, historic buildings. Errors have been sought and corrected,but may not always be located. Such creation errors do not reflect on the standard of care.   Author: Delfino Lovett, MD 11/21/2022 4:32 PM  For on call review [email or web address].

## 2022-11-22 DIAGNOSIS — I5043 Acute on chronic combined systolic (congestive) and diastolic (congestive) heart failure: Secondary | ICD-10-CM | POA: Diagnosis not present

## 2022-11-22 DIAGNOSIS — N5089 Other specified disorders of the male genital organs: Secondary | ICD-10-CM

## 2022-11-22 DIAGNOSIS — Z7189 Other specified counseling: Secondary | ICD-10-CM | POA: Diagnosis not present

## 2022-11-22 DIAGNOSIS — I2510 Atherosclerotic heart disease of native coronary artery without angina pectoris: Secondary | ICD-10-CM | POA: Diagnosis not present

## 2022-11-22 DIAGNOSIS — I10 Essential (primary) hypertension: Secondary | ICD-10-CM | POA: Diagnosis not present

## 2022-11-22 DIAGNOSIS — L97909 Non-pressure chronic ulcer of unspecified part of unspecified lower leg with unspecified severity: Secondary | ICD-10-CM | POA: Diagnosis not present

## 2022-11-22 DIAGNOSIS — E1142 Type 2 diabetes mellitus with diabetic polyneuropathy: Secondary | ICD-10-CM | POA: Diagnosis not present

## 2022-11-22 DIAGNOSIS — F1010 Alcohol abuse, uncomplicated: Secondary | ICD-10-CM | POA: Diagnosis not present

## 2022-11-22 LAB — GLUCOSE, CAPILLARY
Glucose-Capillary: 148 mg/dL — ABNORMAL HIGH (ref 70–99)
Glucose-Capillary: 170 mg/dL — ABNORMAL HIGH (ref 70–99)
Glucose-Capillary: 221 mg/dL — ABNORMAL HIGH (ref 70–99)
Glucose-Capillary: 212 mg/dL — ABNORMAL HIGH (ref 70–99)

## 2022-11-22 NOTE — Consult Note (Signed)
Consultation Note Date: 11/22/2022   Patient Name: Manuel [name]  DOB: [birth year]  MRN: [id number]  Age / Sex: 77 y.o., male  PCP: Gracelyn Nurse, MD Referring Physician: Delfino Lovett, MD  Reason for Consultation: Establishing goals of care   HPI/Brief Hospital Course: 77 y.o. male  with past medical history of T2DM, CAD s/p PCI, HFrEF, PAD s/p stent 2023, daily ETOH use including beer, wine and liquor and PAD admitted from home on 11/10/2022 with right lower extremity ischemia, ulceration with gangrenous toe wound.  7/22 initial vascular intervention: LE angiogram-found to have extensive disease, in need of bilateral femoral endarterectomy and bilateral iliac stents  7/23-7/25 symptoms concerning for fluid overload secondary to HF, echo found EF reduced to 25-30%. Psych consulted for possible SI-cleared.  7/26 Underwent extensive vascular procedure as noted above 7/29 right fourth and fifth toes amputated  7/31-8/1 complications related to constipation-now resolved. Ongoing symptoms related to volume overload-managed with lasix-caution used due to increasing creatinine levels  Reportedly has not had ETOH withdrawal symptoms during hospitalization, endorses daily ETOH consumption-5-6 drinks/day, has consumed ETOH on daily basis for many years   Palliative medicine was consulted for assisting with goals of care conversations.  Subjective:  Extensive chart review has been completed prior to meeting patient including labs, vital signs, imaging, progress notes, orders, and available advanced directive documents from current and previous encounters.  Visited with Manuel [name] at his bedside. Awake, alert, oriented and able to engage in GOC conversations. Wife-Manuel [name] at bedside during visit and GOC conversations.  Introduced myself as a Publishing rights manager as a member of the palliative care team. Explained palliative medicine is specialized medical care for  people living with serious illness. It focuses on providing relief from the symptoms and stress of a serious illness. The goal is to improve quality of life for both the patient and the family.   Mr. Grasse provides a brief life review. He and Manuel [name] have been married for 31 years, he has 3 children from a previous relationship but Manuel [name] is very close to his children. 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 He shares his consumption of ETOH came from being a musician. He recalls many of his close friends/neighbors that have passed away or are in poor health. He feels appreciative of the time he has been given this far and feels he has had a fairly healthy journey. He shares he is coming to a place of acceptance that he is aging and is more acutely aware that his time left is limited.  Prior to admission he remained independent, he recently hired someone to mow his lawn at it was difficult for him-he struggled with losing that sense of independence which prompted him to consume higher volumes of ETOH.  He is able to share his understanding of current medical condition-we discussed in detail HFrEF and chronic disease trajectory related to this chronic illness. Strongly encouraged close follow-up with HF clinic to avoid recurrent hospitalizations through close symptom management. We also discussed the long term effects of ETOH consumption for which he shares he is well aware. We discussed ETOH cessation-encouraged continuing cessation at discharge as he has been 12 days without ETOH during this hospitalization-he is not fully on board with complete cessation.  Attempted to elicit  goals of care. We discussed code status-Full Code versus Do Not Resuscitate. He clearly stated he wishes to be DNR-he wishes for a natural  death and is not open to any resuscitation measures including mechanical ventilation. He is also clear he would not want long term life preserving measures.  He shares he does not feel his children need to be involved in decision making at this time as he feels they are supportive of these decisions he has made.  We discussed Advanced Directive documentation. He wishes to appoint his wife-Manuel [name] as HCPOA and is interested in completing Living Will. Reviewed AD document-left at bedside, wife working on document completion. Chaplain services consulted to assist with completion, witnesses and notary.  Mr. Cubit shares his pain is minimal and well managed with PTA gabapentin, he has been avoid narcotic pain medication today to avoid recurrent constipation. He shares he was able to work with PT today-walked around room, denies SHOB, increased WOB or pain.  I discussed importance of continued conversations with family/support persons and all members of their medical team regarding overall plan of care and treatment options ensuring decisions are in alignment with patients goals of care.  All questions/concerns addressed. Emotional support provided to patient/family/support persons. PMT will continue to follow and support patient as needed.  Objective: Primary Diagnoses: Present on Admission:  Ischemic ulcer of lower extremity (HCC)  Alcohol abuse  Mixed hyperlipidemia  Diabetic polyneuropathy associated with type 2 diabetes mellitus (HCC)  Hypertension  Limb ischemia  Adjustment disorder with depressed mood   Physical Exam Constitutional:      General: He is not in acute distress.    Appearance: He is not ill-appearing.  Pulmonary:     Effort: Pulmonary effort is normal. No respiratory distress.  Skin:    General: Skin is warm and dry.  Neurological:     Mental Status: He is alert.     Vital Signs: BP (!) 119/57 (BP Location: Left Arm)   Pulse (!) 54   Temp 97.7 F (36.5 C) (Oral)    Resp 20   Ht 5\' 8"  (1.727 m)   Wt 116.9 kg   SpO2 96%   BMI 39.20 kg/m  Pain Scale: 0-10 POSS *See Group Information*: 1-Acceptable,Awake and alert Pain Score: 4   IO: Intake/output summary:  Intake/Output Summary (Last 24 hours) at 11/22/2022 1613 Last data filed at 11/22/2022 1105 Gross per 24 hour  Intake 980 ml  Output 900 ml  Net 80 ml    LBM: Last BM Date : 11/22/22 Baseline Weight: Weight: 86.2 kg Most recent weight: Weight: 116.9 kg       Palliative Assessment/Data:70%   Assessment and Plan  SUMMARY OF RECOMMENDATIONS   DNR/DNI Encouraged completion of AD-reviewed, left at bedside, consulted chaplain services PMT to continue to follow for ongoing needs and support  Discussed With: Primary team and nursing staff.   Thank you for this consult and allowing Palliative Medicine to participate in the care of Manuel [name]. Palliative medicine will continue to follow and assist as needed.   Time Total: 75 minutes  Time spent includes: Detailed review of medical records (labs, imaging, vital signs), medically appropriate exam (mental status, respiratory, cardiac, skin), discussed with treatment team, counseling and educating patient, family and staff, documenting clinical information, medication management and coordination of care.   Signed by: Leeanne Deed, DNP, AGNP-C Palliative Medicine    Please contact Palliative Medicine Team phone at [phone] for questions and concerns.  For individual provider: See  Amion

## 2022-11-22 NOTE — Anesthesia Postprocedure Evaluation (Signed)
Anesthesia Post Note  Patient: Manuel [name]  Procedure(s) Performed: AMPUTATION 4TH AND 5TH RAY (Right: Foot)  Patient location during evaluation: PACU Anesthesia Type: General Level of consciousness: awake and alert Pain management: pain level controlled Vital Signs Assessment: post-procedure vital signs reviewed and stable Respiratory status: spontaneous breathing, nonlabored ventilation, respiratory function stable and patient connected to nasal cannula oxygen Cardiovascular status: blood pressure returned to baseline and stable Postop Assessment: no apparent nausea or vomiting Anesthetic complications: no   No notable events documented.   Last Vitals:  Vitals:   11/21/22 2325 11/22/22 0448  BP: (!) 142/65 (!) 157/78  Pulse: (!) 54 (!) 54  Resp: 20 (!) 22  Temp: 36.5 C 36.6 C  SpO2: 96% 95%    Last Pain:  Vitals:   11/21/22 1937  TempSrc:   PainSc: 0-No pain                 [name]

## 2022-11-22 NOTE — Progress Notes (Signed)
   11/22/22 1600  Spiritual Encounters  Type of Visit Initial  Care provided to: Family  Referral source Family  Reason for visit Advance directives  OnCall Visit Yes  Spiritual Framework  Presenting Themes Significant life change  Patient Stress Factors Health changes  Interventions  Spiritual Care Interventions Made Decision-making support/facilitation   Chaplain met with patients wife to talk about the advance directives. Chaplain educated her on what the form is for and how to fill out the form. Chaplain services will notify notary and witnesses when the form is complete. Chaplain services will continue to be available for support.

## 2022-11-22 NOTE — Care Management Important Message (Signed)
Important Message  Patient Details  Name: Manuel [name] MRN: [id number] Date of Birth: [birth year]   Medicare Important Message Given:  Yes     Olegario Messier A Langston [place] 11/22/2022, 11:56 AM

## 2022-11-22 NOTE — Progress Notes (Signed)
Progress Note   Patient: Manuel [name] DOB: 1945-10-14 DOA: 11/10/2022     12 DOS: the patient was seen and examined on 11/22/2022   Brief hospital course: Taken from H&P.   Manuel [name] is a 77 y.o. male with medical history significant of  insulin-dependent DM, HTN, CAD, PAD s/p angioplasty with stent 05/14/2021 presenting with right lower extremity ischemia and ulcer formation with gangrenous toe.  Presented to the ER afebrile, hemodynamically stable. White count 11.2, hemoglobin 15.2, platelets 303, lactate 1.2, creatinine 0.9. T. bili 1.5. Plain films of the right foot with no signs of osteomyelitis. Dr. [name] with vascular surgery initially consulted with recommendation for heparin drip. Dr. [name] with podiatry also consulted for further evaluation.   7/22: Vital stable.  Going for lower extremity angiography and possible angioplasty with vascular today.  CRP elevated at 10.1, decreased prealbumin at 15 and normal ESR.  Preliminary blood cultures negative.  Continue current broad-spectrum antibiotics.  Found to have very extensive disease and most likely will required bilateral femoral endarterectomies and bilateral iliac stents.  Vascular is looking for a OR time for this extensive procedure which will require 4 to 5 hours.  7/23: Vital and labs stable.  Overnight some shortness of breath and crackles.  One-time 40 mg of IV Lasix was given.  Chest x-ray ordered but patient refused. Patient had a choking spell on meds this morning, continued to feel something sticking in his chest.  No hypoxia.  Chest x-ray with diffuse interstitial prominence and areas of nodularity throughout the both lungs.  Differential include atypical inflammatory process versus atypical pulmonary edema.  Ordered 1 dose of IV Lasix Going for his extensive vascular procedure tomorrow.  7/24: Mildly elevated blood pressure.  BNP elevated at 932, surgery got canceled for concern of acute on chronic  heart failure and patient now need cardiology clearance before proceeding. Family concern of suicidal thoughts, psych was consulted.  7/25: Hemodynamically stable.  Will echocardiogram with EF of 25 to 30% with some regional wall motion abnormalities.  Likely weak going for vascular surgery tomorrow, pending final recommendations from cardiology. Psychiatry with no suicidal thoughts.  7/26: Hemodynamically stable.  Going for vascular procedure today.  7/27: Vital stable.  Patient underwent extensive vascular procedure which involved bilateral lower extremity endarterectomies and multiple stents placement.  Pulses present with Doppler this morning.  Significant blood loss during the procedure, hemoglobin decreased to 11.5 this morning, baseline of 13.  Started on supplement.  7/28: Vital stable.  Labs with increase in creatinine to 1.31-holding Lasix today.  Hemoglobin further decreased to 9.4.  Likely be going for toe amputation tomorrow.  Arterial line removed.  7/29: Going for toes removal today.  Slight worsening of renal function today, holding diuretic and giving some IV fluid as patient is n.p.o.  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  Hemoglobin decreased to 7.8 postsurgically. Patient will be going home with 2 wound VAC in place in his groin areas from vascular surgery and they will remove it in the clinic on 11/25/2022.  Repeat PT and OT evaluation pending but he would like to go home with home health.  If remains stable likely can be discharged tomorrow. Heparin infusion is being discontinued and patient will be started on aspirin and Plavix per vascular surgery.  7/31: Hemodynamically stable.  Physical therapy recommendation remained for home health, which was ordered.  Unasyn can be switched to Augmentin on discharge for 1 more week per podiatry.  Appears volume up again today, worsening renal function and lower extremity edema, remained on room air.  Restarting IV Lasix.   Holding discharge until volume status improves.  8/1: Still having shortness of breath, orthopnea and scrotal swelling. 8/2: Urology consult per patient request.  Discontinued narcotics and other vitamins per patient request.  Had good bowel movement  Assessment and Plan: * Ischemic ulcer of lower extremity (HCC) Worsening right lower extremity redness swelling and pain over the past 2 weeks in the setting of baseline limb ischemia status post angioplasty and stenting January 2023.Noted worsening ulcer formation across multiple digits of the foot with concern for gangrene Patient reports compliance with Plavix and statin therapy Started on heparin drip in the ER  Vascular procedure with extensive peripheral vascular disease, second procedure for bilateral endarterectomies and bilateral iliac stent placement with vascular surgery was done yesterday, tolerated the procedure well.  -Stop Augmentin at discharge -S/p fourth and fifth toe amputation on 7/29. -Heparin was discontinued on 7/30 and he was started on aspirin and Plavix per vascular surgery He has bilateral Prevena wound vacs that need to be removed 10 days post op. on 11/25/22. They just get removed and thrown in the trash (he entire unit) per vascular surgery.   Scrotal swelling Due to fluid overload.  Seen by urology recommend scrotal elevation and diuresis Scrotal ultrasound not showing any acute pathology  Acute on chronic combined systolic and diastolic CHF (congestive heart failure) (HCC) According to a prior echo done in 2020, EF of 40 to 45%. Patient with concern of shortness of breath, mild hypoxia and chest x-ray with bilateral opacities.  BNP elevated at 932. Repeat echocardiogram with further decrease of EF to 20 to 25% with regional wall motion abnormalities. Becoming volume overloaded again with slight worsening of renal function as Lasix was held for the past couple of days.  He has orthopnea and PND.  Also has scrotal  swelling.  Scrotal ultrasound performed and is within normal limit. -Increase Lasix to 60 mg IV twice daily per cardiology -Daily weight and BMP -Strict intake and output Net IO Since Admission: 1,071.89 mL [11/22/22 1505]   Diabetic polyneuropathy associated with type 2 diabetes mellitus (HCC) Cont neurontin   Coronary artery disease with history of myocardial infarction without history of CABG Followed by Dr. [name] outpt  CAD s/p PCI to [facility] with overlapping 3 mm Xience stents of 23 mm and 12 mm length in 2011  No active chest pain Continue aspirin and Plavix  Hypertension Patient required nitro GGT after the procedure which is off now Continue Coreg, Lasix  Diabetes mellitus type 2, insulin dependent (HCC) SSI  Alcohol abuse 3-4 beer daily drinker + mixed drinks  off CIWA protocol  Follow   Mixed hyperlipidemia Continue statin  Adjustment disorder with depressed mood Family was concerned about some suicidal thoughts so psychiatry was consulted. They cleared him as patient denies any suicidal thoughts, stating that he was just frustrated  Constipation Now resolved He is in agreement to stop all his narcotics as he feels that had been contributing to his constipation   Subjective: Reports shortness of breath, orthopnea and gassy feeling.  Also has scrotal swelling  Physical Exam: Vitals:   11/22/22 0300 11/22/22 0448 11/22/22 0850 11/22/22 1223  BP:  (!) 157/78 (!) 166/81 (!) 119/57  Pulse:  (!) 54 61 (!) 54  Resp:  (!) 22 16 20   Temp:  97.8 F (36.6 C) 97.7 F (36.5 C) 97.7 F (36.5 C)  TempSrc:    Oral  SpO2:  95% 95% 96%  Weight: 116.9 kg     Height:       General.  Frail elderly man, in no acute distress. Pulmonary.  Lungs clear bilaterally, normal respiratory effort. CV.  Regular rate and rhythm, no JVD, rub or murmur. Abdomen.  Soft, distended, hypoactive bowel sounds CNS.  Alert and oriented .  No focal neurologic deficit. Extremities.  2+ LE  edema, no cyanosis, palpable pulses on left, Prevena in place, has scrotal swelling Psychiatry.  Judgment and insight appears normal.    Data Reviewed: Prior data reviewed  Family Communication: No family at bedside Disposition: Status is: Inpatient Remains inpatient appropriate because: Severity of illness  Planned Discharge Destination:  Home   Time spent: 35 minutes  This record has been created using Conservation officer, historic buildings. Errors have been sought and corrected,but may not always be located. Such creation errors do not reflect on the standard of care.   Author: Delfino Lovett, MD 11/22/2022 3:05 PM  For on call review [email or web address].

## 2022-11-22 NOTE — Plan of Care (Signed)
  Problem: Education: Goal: Knowledge of General Education information will improve Description: Including pain rating scale, medication(s)/side effects and non-pharmacologic comfort measures Outcome: Progressing   Problem: Clinical Measurements: Goal: Will remain free from infection Outcome: Progressing   Problem: Clinical Measurements: Goal: Respiratory complications will improve Outcome: Progressing   Problem: Clinical Measurements: Goal: Cardiovascular complication will be avoided Outcome: Progressing   Problem: Activity: Goal: Risk for activity intolerance will decrease Outcome: Progressing   Problem: Elimination: Goal: Will not experience complications related to bowel motility Outcome: Progressing   Problem: Elimination: Goal: Will not experience complications related to urinary retention Outcome: Progressing   Problem: Pain Managment: Goal: General experience of comfort will improve Outcome: Progressing   Problem: Safety: Goal: Ability to remain free from injury will improve Outcome: Progressing

## 2022-11-22 NOTE — Consult Note (Signed)
Urology Consult   I have been asked to see the patient by Dr. Clelia Croft, for evaluation and management of scrotal swelling.  Chief Complaint: Scrotal swelling  HPI:  Manuel [name] is a 77 y.o. originally admitted for gangrene of the right foot and pain on the left, underwent amputation with podiatry, as well as intervention with vascular surgery for bilateral femoral endarterectomies, embolectomy, angiogram, stent placement in the bilateral common iliac arteries and left external iliac arteries.  He remains hospitalized for overall care as well as fluid overload.  He has had worsening scrotal swelling over the last few days.  Scrotal ultrasound performed yesterday shows diffuse scrotal edema, questionable air in the soft tissue.  He continues to void spontaneously, reports mild scrotal discomfort.  PMH: Past Medical History:  Diagnosis Date   Allergies    Arthritis    Diabetes mellitus without complication (HCC)    Gout    Hard of hearing    Hyperlipidemia    Hypertension    Myocardial infarction [facility]) 2008   treated here at [facility] and transferred to [place] for stent placement   Neuropathy    knees down   Peripheral vascular disease (HCC)    Sleep apnea    "in the past"   Status post primary angioplasty with coronary stent    Wears dentures    full upper and lower    Surgical History: Past Surgical History:  Procedure Laterality Date   AMPUTATION Right 11/18/2022   Procedure: AMPUTATION 4TH AND 5TH RAY;  Surgeon: Linus Galas, [name];  Location: [facility] OR[facility];  Service: Orthopedics/Podiatry;  Laterality: Right;  4th and 5th toe   CATARACT EXTRACTION W/PHACO Right 03/14/2021   Procedure: CATARACT EXTRACTION PHACO AND INTRAOCULAR LENS PLACEMENT (IOC) RIGHT DIABETIC;  Surgeon: Lockie Mola, MD;  Location: [facility];  Service: Ophthalmology;  Laterality: Right;  Diabetic 16.78 01:39.9   CATARACT EXTRACTION W/PHACO Left 03/28/2021   Procedure: CATARACT EXTRACTION  PHACO AND INTRAOCULAR LENS PLACEMENT (IOC) LEFT DIABETIC 6.93 01:22.0;  Surgeon: Lockie Mola, MD;  Location: [facility];  Service: Ophthalmology;  Laterality: Left;  Diabetic   ENDARTERECTOMY FEMORAL Bilateral 11/15/2022   Procedure: BILATERAL COMMON FEMORAL PROFUNDA FEMORIS AND SUPERFICIAL FEMORAL ARTERY ENDARTECTOMIES, RIGHT FOGARTY EMBOLECTOMY OF THE RIGHT SFA  AND  POPLITEAL ARTERIES. AORTAGRAM AND RIGHT LOWER EXTREMITY ANGIOGRAM.;  Surgeon: Annice Needy, MD;  Location: [facility] OR[facility];  Service: Vascular;  Laterality: Bilateral;   INSERTION OF ILIAC STENT Bilateral 11/15/2022   Procedure: BILATERAL STENT INSERTION IN BILATERAL  COMMON ILIAC ARTERY, STENT INSERTION OF LEFT EXTERNAL ILIAC ARTERY. ANGIOPLASTY RIGHT TIBIAL  AND POPLITEAL ARTERY.;  Surgeon: Annice Needy, MD;  Location: [facility];  Service: Vascular;  Laterality: Bilateral;   LOWER EXTREMITY ANGIOGRAPHY Right 05/14/2021   Procedure: LOWER EXTREMITY ANGIOGRAPHY;  Surgeon: Annice Needy, MD;  Location: [facility];  Service: Cardiovascular;  Laterality: Right;   LOWER EXTREMITY ANGIOGRAPHY Right 06/14/2021   Procedure: Lower Extremity Angiography;  Surgeon: Annice Needy, MD;  Location: [facility];  Service: Cardiovascular;  Laterality: Right;   LOWER EXTREMITY ANGIOGRAPHY Right 11/11/2022   Procedure: Lower Extremity Angiography;  Surgeon: Annice Needy, MD;  Location: [facility];  Service: Cardiovascular;  Laterality: Right;   LOWER EXTREMITY INTERVENTION Right 06/15/2021   Procedure: LOWER EXTREMITY INTERVENTION;  Surgeon: Annice Needy, MD;  Location: [facility];  Service: Cardiovascular;  Laterality: Right;   TONSILLECTOMY     had  removed as a child    Allergies: No Known Allergies  Family History: Family History  Problem Relation Age of Onset   Scoliosis Mother    Heart disease Father     Social History:  reports that he has never smoked. He has never used smokeless tobacco. He  reports current alcohol use of about 21.0 standard drinks of alcohol per week. He reports that he does not use drugs.   Physical Exam: BP (!) 166/81 (BP Location: Left Arm)   Pulse 61   Temp 97.7 F (36.5 C)   Resp 16   Ht 5\' 8"  (1.727 m)   Wt 116.9 kg   SpO2 95%   BMI 39.20 kg/m    Constitutional:  Alert and oriented, No acute distress. Cardiovascular: No clubbing, cyanosis, or edema. Respiratory: Normal respiratory effort, no increased work of breathing. GU: Diffuse scrotal swelling, able to visualize phallus, no skin changes or evidence of abscess, mildly tender throughout, no crepitus  Assessment & Plan:   On exam his scrotal swelling is most likely secondary to fluid overload.  No evidence of abscess or Fournier's gangrene on exam, suspect potential area seen on scrotal ultrasound is from recent vascular intervention and wound VAC in the right groin.  Recommend continued diuresis, scrotal elevation.   Sondra Come, MD  Total time spent on the floor was 45 minutes, with greater than 50% spent in counseling and coordination of care with the patient regarding scrotal ultrasound results, scrotal swelling, and fluid overload.  [facility] [address] [place], [place] [address] [phone]

## 2022-11-22 NOTE — Progress Notes (Signed)
[facility] CLINIC CARDIOLOGY CONSULT NOTE       Patient ID: Manuel [name] MRN: [id number] DOB/AGE: 79-Nov-1947 79 y.o.  Admit date: 11/10/2022 Referring Physician Dr. Arnetha Courser  Primary Physician Dr. Marcelino Duster  Primary Cardiologist Dr. 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 Bilateral LE revascularization with Dr. Wyn Quaker was planned on 7/24 but cancelled 2/2 dyspnea and heart failure symptoms. Cardiology is consulted for assistance with the patients heart failure and medical optimization prior to vascular surgery. Echo this admission showed reduced EF 25-30% with multiple rWMAs, mild to moderate MR. Now s/p bilateral common femoral, profunda femoris, SFA endarterectomies, and bilateral common iliac artery kissing expandable stents, and left external iliac stenting x 2 performed on 7/26.  Also s/p right fourth and fifth toe amputations performed 7/29.  Interval History:  -Patient endorses improvement in shortness of breath today.  Has been weaned to room air. -Lower extremity edema persists, 2+ pitting at the time of my evaluation. -Patient reporting problems with gas pain, reports multiple bowel movements over the last 24 hours.  Review of systems complete and found to be negative unless listed above     Past Medical History:  Diagnosis Date   Allergies    Arthritis    Diabetes mellitus without complication (HCC)    Gout    Hard of hearing    Hyperlipidemia    Hypertension    Myocardial infarction [facility]) 2008   treated here at [facility] and transferred to [place] for stent placement    Neuropathy    knees down   Peripheral vascular disease (HCC)    Sleep apnea    "in the past"   Status post primary angioplasty with coronary stent    Wears dentures    full upper and lower    Past Surgical History:  Procedure Laterality Date   AMPUTATION Right 11/18/2022   Procedure: AMPUTATION 4TH AND 5TH RAY;  Surgeon: Linus Galas, [name];  Location: [facility] ORS;  Service: Orthopedics/Podiatry;  Laterality: Right;  4th and 5th toe   CATARACT EXTRACTION W/PHACO Right 03/14/2021   Procedure: CATARACT EXTRACTION PHACO AND INTRAOCULAR LENS PLACEMENT (IOC) RIGHT DIABETIC;  Surgeon: Lockie Mola, MD;  Location: [facility];  Service: Ophthalmology;  Laterality: Right;  Diabetic 16.78 01:39.9   CATARACT EXTRACTION W/PHACO Left 03/28/2021   Procedure: CATARACT EXTRACTION PHACO AND INTRAOCULAR LENS PLACEMENT (IOC) LEFT DIABETIC 6.93 01:22.0;  Surgeon: Lockie Mola, MD;  Location: [facility];  Service: Ophthalmology;  Laterality: Left;  Diabetic   ENDARTERECTOMY FEMORAL Bilateral 11/15/2022   Procedure: BILATERAL COMMON FEMORAL PROFUNDA FEMORIS AND SUPERFICIAL FEMORAL ARTERY ENDARTECTOMIES, RIGHT FOGARTY EMBOLECTOMY OF THE RIGHT SFA  AND  POPLITEAL ARTERIES. AORTAGRAM AND RIGHT LOWER EXTREMITY ANGIOGRAM.;  Surgeon: Annice Needy, MD;  Location: [facility] ORS;  Service: Vascular;  Laterality: Bilateral;   INSERTION OF ILIAC STENT Bilateral 11/15/2022   Procedure: BILATERAL STENT INSERTION IN BILATERAL  COMMON ILIAC ARTERY, STENT INSERTION OF LEFT EXTERNAL ILIAC ARTERY. ANGIOPLASTY RIGHT TIBIAL  AND POPLITEAL ARTERY.;  Surgeon: Annice Needy, MD;  Location: [facility] ORS;  Service: Vascular;  Laterality: Bilateral;   LOWER  EXTREMITY ANGIOGRAPHY Right 05/14/2021   Procedure: LOWER EXTREMITY ANGIOGRAPHY;  Surgeon: Annice Needy, MD;  Location: [facility] INV[facility];  Service: Cardiovascular;  Laterality: Right;   LOWER EXTREMITY ANGIOGRAPHY Right 06/14/2021   Procedure: Lower Extremity  Angiography;  Surgeon: Annice Needy, MD;  Location: [facility] IN[facility] CV LAB;  Service: Cardiovascular;  Laterality: Right;   LOWER EXTREMITY ANGIOGRAPHY Right 11/11/2022   Procedure: Lower Extremity Angiography;  Surgeon: Annice Needy, MD;  Location: [facility] INVAS[facility] CV LAB;  Service: Cardiovascular;  Laterality: Right;   LOWER EXTREMITY INTERVENTION Right 06/15/2021   Procedure: LOWER EXTREMITY INTERVENTION;  Surgeon: Annice Needy, MD;  Location: [facility] INVAS[facility] CV [facility];  Service: Cardiovascular;  Laterality: Right;   TONSILLECTOMY     had removed as a child    Medications Prior to Admission  Medication Sig Dispense Refill Last Dose   amLODipine (NORVASC) 10 MG tablet Take 10 mg by mouth daily.   Past Week   carvedilol (COREG) 12.5 MG tablet Take 1 tablet by mouth 2 (two) times daily.   Past Week   clopidogrel (PLAVIX) 75 MG tablet Take 1 tablet (75 mg total) by mouth daily. 90 tablet 1 Past Week   gabapentin (NEURONTIN) 400 MG capsule Take 800 mg by mouth 2 (two) times daily.   Past Week   insulin aspart protamine- aspart (NOVOLOG MIX 70/30) (70-30) 100 UNIT/ML injection Inject 32 Units into the skin 2 (two) times daily with a meal.   Past Week   lisinopril (ZESTRIL) 20 MG tablet Take 1 tablet (20 mg total) by mouth daily. 30 tablet 1 Past Week   apixaban (ELIQUIS) 5 MG TABS tablet Take 1 tablet (5 mg total) by mouth 2 (two) times daily. (Patient not taking: Reported on 01/25/2022) 60 tablet 1 Not Taking   aspirin EC 81 MG tablet Take 1 tablet (81 mg total) by mouth daily. (Patient not taking: Reported on 08/13/2022) 150 tablet 2 Not Taking   atorvastatin (LIPITOR) 80 MG tablet Take 1 tablet (80 mg total) by mouth daily. (Patient not taking: Reported on 11/11/2022) 30 tablet 1 Not Taking   cefdinir (OMNICEF) 300 MG capsule Take 300 mg by mouth 2 (two) times daily. (Patient not taking: Reported on 11/11/2022)   Not Taking   DULoxetine (CYMBALTA) 30 MG capsule Take 30 mg by mouth daily. (Patient not taking:  Reported on 07/11/2021)      fluticasone (FLONASE) 50 MCG/ACT nasal spray Place 2 sprays into both nostrils as needed for allergies or rhinitis. (Patient not taking: Reported on 07/11/2021)      gabapentin (NEURONTIN) 100 MG capsule Take 100 mg by mouth at bedtime. (Patient not taking: Reported on 11/11/2022)   Not Taking   Ibuprofen 200 MG CAPS Take 2 tablets by mouth 3 times/day as needed-between meals & bedtime. (Patient not taking: Reported on 11/11/2022)   Not Taking   levocetirizine (XYZAL) 5 MG tablet Take 5 mg by mouth at bedtime. (Patient not taking: Reported on 08/13/2022)   Not Taking   polyethylene glycol (MIRALAX / GLYCOLAX) 17 g packet Take 17 g by mouth daily. (Patient not taking: Reported on 08/13/2022) 14 each 0    senna-docusate (SENOKOT-S) 8.6-50 MG tablet Take 1 tablet by mouth at bedtime as needed for mild constipation. 30 tablet 0 prn   zolpidem (AMBIEN) 5 MG tablet Take 1 tablet (5 mg total) by mouth at bedtime as needed for up to 5 doses for sleep. (Patient not taking: Reported on 07/11/2021) 5 tablet 0  Not Taking   Social History   Socioeconomic History   Marital status: Married    Spouse name: Not on file   Number of children: Not on file   Years of education: Not on file   Highest education level: Not on file  Occupational History   Not on file  Tobacco Use   Smoking status: Never   Smokeless tobacco: Never  Vaping Use   Vaping status: Never Used  Substance and Sexual Activity   Alcohol use: Yes    Alcohol/week: 21.0 standard drinks of alcohol    Types: 7 Glasses of wine, 14 Cans of beer per week    Comment: occassional   Drug use: No   Sexual activity: Not on file  Other Topics Concern   Not on file  Social History Narrative   Not on file   Social Determinants of Health   Financial Resource Strain: Not on file  Food Insecurity: No Food Insecurity (11/10/2022)   Hunger Vital Sign    Worried About Running Out of Food in the Last Year: Never true    Ran Out  of Food in the Last Year: Never true  Transportation Needs: No Transportation Needs (11/10/2022)   PRAPARE - Administrator, Civil Service (Medical): No    Lack of Transportation (Non-Medical): No  Physical Activity: Not on file  Stress: Not on file  Social Connections: Not on file  Intimate Partner Violence: Not At Risk (11/10/2022)   Humiliation, Afraid, Rape, and Kick questionnaire    Fear of Current or Ex-Partner: No    Emotionally Abused: No    Physically Abused: No    Sexually Abused: No    Family History  Problem Relation Age of Onset   Scoliosis Mother    Heart disease Father       Intake/Output Summary (Last 24 hours) at 11/22/2022 1414 Last data filed at 11/22/2022 1105 Gross per 24 hour  Intake 1080 ml  Output 1050 ml  Net 30 ml    Vitals:   11/22/22 0300 11/22/22 0448 11/22/22 0850 11/22/22 1223  BP:  (!) 157/78 (!) 166/81 (!) 119/57  Pulse:  (!) 54 61 (!) 54  Resp:  (!) 22 16 20   Temp:  97.8 F (36.6 C) 97.7 F (36.5 C) 97.7 F (36.5 C)  TempSrc:    Oral  SpO2:  95% 95% 96%  Weight: 116.9 kg     Height:        PHYSICAL EXAM General: Pleasant conversational elderly male, well nourished, in no acute distress. Sitting in recliner, with legs elevated. HEENT:  Normocephalic and atraumatic. Neck:  No JVD.  Lungs: Normal respiratory effort on room air trace bibasilar crackles without appreciable wheezes.   Heart: HRRR . Normal S1 and S2 without gallops or murmurs.  Abdomen: Non-distended appearing adiposity.  Msk: Normal strength and tone for age. Extremities: 1-2+ bilateral lower extremity edema. surgical shoe and dressing applied to right lower extremity.   Neuro: Alert and oriented X 3. Psych:  Answers questions appropriately.   Labs: Basic Metabolic Panel: Recent Labs    11/21/22 0720 11/22/22 0518  NA 131* 131*  K 4.1 4.6  CL 97* 97*  CO2 21* 25  GLUCOSE 143* 167*  BUN 57* 55*  CREATININE 1.82* 1.71*  CALCIUM 8.2* 8.4*   Liver  Function Tests: No results for input(s): "AST", "ALT", "ALKPHOS", "BILITOT", "PROT", "ALBUMIN" in the last 72 hours. No results for input(s): "LIPASE", "AMYLASE" in the last 72  hours. CBC: Recent Labs    11/22/22 0518  WBC 8.7  HGB 8.3*  HCT 24.0*  MCV 87.6  PLT 416*   Cardiac Enzymes: No results for input(s): "CKTOTAL", "CKMB", "CKMBINDEX", "TROPONINIHS" in the last 72 hours. BNP: No results for input(s): "BNP" in the last 72 hours.  D-Dimer: No results for input(s): "DDIMER" in the last 72 hours. Hemoglobin A1C: No results for input(s): "HGBA1C" in the last 72 hours. Fasting Lipid Panel: No results for input(s): "CHOL", "HDL", "LDLCALC", "TRIG", "CHOLHDL", "LDLDIRECT" in the last 72 hours. Thyroid Function Tests: No results for input(s): "TSH", "T4TOTAL", "T3FREE", "THYROIDAB" in the last 72 hours.  Invalid input(s): "FREET3" Anemia Panel: No results for input(s): "VITAMINB12", "FOLATE", "FERRITIN", "TIBC", "IRON", "RETICCTPCT" in the last 72 hours.   Radiology: US SCROTUM W/DOPPLER  Result Date: 11/21/2022 CLINICAL DATA:  86310 Swelling 16109 EXAM: SCROTAL ULTRASOUND DOPPLER ULTRASOUND OF THE TESTICLES TECHNIQUE: Complete ultrasound examination of the testicles, epididymis, and other scrotal structures was performed. Color and spectral Doppler ultrasound were also utilized to evaluate blood flow to the testicles. COMPARISON:  None Available. FINDINGS: Right testicle Measurements: 4.1 x 3.1 x 3.0 cm. No mass or microlithiasis visualized. Left testicle Measurements: 4.2 x 3.0 x 3.0 cm. No mass or microlithiasis visualized. Right epididymis: Mildly heterogeneous and expanded in appearance measuring 1.0 x 1.4 x 1.6 cm. Left epididymis: Mildly heterogeneous and expanded appearance measuring 1.0 x 1.2 x 1.9 cm. Hydrocele:  Trace peritesticular fluid. Varicocele:  None visualized. Pulsed Doppler interrogation of both testes demonstrates normal low resistance arterial and venous waveforms  bilaterally. Other: Soft tissue edema and dermal thickening. On several of the cine images, there is possible echogenic areas with posterior dirty shadowing in the posterior scrotal soft tissues (for example series 10 image 40) IMPRESSION: 1. There is diffuse scrotal edema and dermal thickening. There are possible echogenic areas with posterior dirty shadowing in the posterior scrotal soft tissues suggestive of air. This is nonspecific and could reflect artifact or trapped air but subcutaneous air is in the differential. Recommend correlation with physical exam with consideration of dedicated CT low pelvis to evaluate for intradermal air if there is concern for Fournier's gangrene. 2. The epididymides are mildly heterogeneous and expanded in appearance. This is nonspecific and likely due to chronic inflammation or sequela of prior infection. 3. No sonographic evidence of testicular torsion. These results will be called to the ordering clinician or representative by the Radiologist Assistant, and communication documented in the PACS or [employer]. Electronically Signed   By: Meda Klinefelter M.D.   On: 11/21/2022 14:11   DG C-Arm 1-60 Min-No Report  Result Date: 11/15/2022 Fluoroscopy was utilized by the requesting physician.  No radiographic interpretation.   ECHOCARDIOGRAM COMPLETE  Result Date: 11/14/2022    ECHOCARDIOGRAM REPORT   Patient Name:   Manuel [name] Date of Exam: 11/14/2022 Medical Rec #:  [id number]        Height:       68.0 in Accession #:    [id number]       Weight:       191.6 lb Date of Birth:  Apr 18, 1946        BSA:          2.007 m Patient Age:    77 years         BP:           151/83 mmHg Patient Gender: M  HR:           88 bpm. Exam Location:  [facility] Procedure: 2D Echo, Cardiac Doppler, Color Doppler and Strain Analysis Indications:     Dyspnea R06.00  History:         Patient has no prior history of Echocardiogram examinations.                  Previous Myocardial  Infarction; Risk Factors:Hypertension and                  Diabetes.  Sonographer:     Cristela Blue Referring Phys:  [id number] Tonna Corner MICHELLE TANG Diagnosing Phys: Manuel Millard MD  Sonographer Comments: Global longitudinal strain was attempted. IMPRESSIONS  1. Left ventricular ejection fraction, by estimation, is 25 to 30%. The left ventricle has severely decreased function. The left ventricle demonstrates regional wall motion abnormalities (see scoring diagram/findings for description). Left ventricular diastolic parameters were normal.  2. Right ventricular systolic function is normal. The right ventricular size is normal.  3. The mitral valve is normal in structure. Mild to moderate mitral valve regurgitation. No evidence of mitral stenosis.  4. Tricuspid valve regurgitation is mild to moderate.  5. The aortic valve is normal in structure. Aortic valve regurgitation is not visualized. No aortic stenosis is present.  6. The inferior vena cava is normal in size with greater than 50% respiratory variability, suggesting right atrial pressure of 3 mmHg. FINDINGS  Left Ventricle: Left ventricular ejection fraction, by estimation, is 25 to 30%. The left ventricle has severely decreased function. The left ventricle demonstrates regional wall motion abnormalities. The left ventricular internal cavity size was normal  in size. There is no left ventricular hypertrophy. Left ventricular diastolic parameters were normal.  LV Wall Scoring: The basal anteroseptal segment, apical lateral segment, apical septal segment, and apex are akinetic. The mid anterolateral segment is hypokinetic. Right Ventricle: The right ventricular size is normal. No increase in right ventricular wall thickness. Right ventricular systolic function is normal. Left Atrium: Left atrial size was normal in size. Right Atrium: Right atrial size was normal in size. Pericardium: There is no evidence of pericardial effusion. Mitral Valve: The mitral valve is  normal in structure. Mild to moderate mitral valve regurgitation. No evidence of mitral valve stenosis. Tricuspid Valve: The tricuspid valve is normal in structure. Tricuspid valve regurgitation is mild to moderate. No evidence of tricuspid stenosis. Aortic Valve: The aortic valve is normal in structure. Aortic valve regurgitation is not visualized. No aortic stenosis is present. Aortic valve mean gradient measures 2.0 mmHg. Aortic valve peak gradient measures 4.2 mmHg. Aortic valve area, by VTI measures 2.55 cm. Pulmonic Valve: The pulmonic valve was normal in structure. Pulmonic valve regurgitation is not visualized. No evidence of pulmonic stenosis. Aorta: The aortic root is normal in size and structure. Venous: The inferior vena cava is normal in size with greater than 50% respiratory variability, suggesting right atrial pressure of 3 mmHg. IAS/Shunts: No atrial level shunt detected by color flow Doppler.  LEFT VENTRICLE PLAX 2D LVIDd:         4.60 cm      Diastology LVIDs:         4.00 cm      LV e' medial:    3.70 cm/s LV PW:         1.00 cm      LV E/e' medial:  30.3 LV IVS:        1.30 cm  LV e' lateral:   6.09 cm/s LVOT diam:     2.00 cm      LV E/e' lateral: 18.4 LV SV:         44 LV SV Index:   22 LVOT Area:     3.14 cm  LV Volumes (MOD) LV vol d, MOD A2C: 156.0 ml LV vol d, MOD A4C: 191.0 ml LV vol s, MOD A2C: 119.0 ml LV vol s, MOD A4C: 134.0 ml LV SV MOD A2C:     37.0 ml LV SV MOD A4C:     191.0 ml LV SV MOD BP:      41.9 ml RIGHT VENTRICLE RV Basal diam:  4.40 cm RV Mid diam:    3.50 cm LEFT ATRIUM             Index        RIGHT ATRIUM           Index LA diam:        3.90 cm 1.94 cm/m   RA Area:     12.60 cm LA Vol (A2C):   68.0 ml 33.89 ml/m  RA Volume:   28.00 ml  13.95 ml/m LA Vol (A4C):   57.5 ml 28.65 ml/m LA Biplane Vol: 63.6 ml 31.69 ml/m  AORTIC VALVE AV Area (Vmax):    2.43 cm AV Area (Vmean):   2.51 cm AV Area (VTI):     2.55 cm AV Vmax:           102.00 cm/s AV Vmean:           67.500 cm/s AV VTI:            0.174 m AV Peak Grad:      4.2 mmHg AV Mean Grad:      2.0 mmHg LVOT Vmax:         79.00 cm/s LVOT Vmean:        53.900 cm/s LVOT VTI:          0.141 m LVOT/AV VTI ratio: 0.81  AORTA Ao Root diam: 3.50 cm MITRAL VALVE MV Area (PHT): 5.02 cm     SHUNTS MV Decel Time: 151 msec     Systemic VTI:  0.14 m MV E velocity: 112.00 cm/s  Systemic Diam: 2.00 cm MV A velocity: 79.10 cm/s MV E/A ratio:  1.42 Manuel Millard MD Electronically signed by Manuel Millard MD Signature Date/Time: 11/14/2022/1:14:40 PM    Final    DG Chest Port 1 View  Result Date: 11/12/2022 CLINICAL DATA:  Shortness of breath EXAM: PORTABLE CHEST 1 VIEW COMPARISON:  Prior chest x-ray 11/09/2021 FINDINGS: Diffuse irregular interstitial prominence with areas of nodularity throughout the lungs. Small bilateral pleural effusions and associated bibasilar atelectasis. Cardiac and mediastinal contours are within normal limits. Atherosclerotic calcifications are visible in the transverse aorta. No pneumothorax. No acute osseous abnormality. Bilateral glenohumeral joint osteoarthritis. IMPRESSION: Diffuse interstitial prominence with areas of nodularity throughout both lungs. Differential considerations include an acute atypical infectious/inflammatory process such as viral pneumonia versus atypical pulmonary edema. Trace bilateral pleural effusions and associated bibasilar atelectasis. Normal heart size. Electronically Signed   By: Malachy Moan M.D.   On: 11/12/2022 13:39   PERIPHERAL VASCULAR CATHETERIZATION  Result Date: 11/11/2022 See surgical note for result.  DG Foot Complete Right  Result Date: 11/10/2022 CLINICAL DATA:  Toe ulcers.  Evaluate for osteomyelitis. EXAM: RIGHT FOOT COMPLETE - 3+ VIEW COMPARISON:  None Available. FINDINGS: There are no signs of acute fracture or  dislocation. No focal bone erosions of osteomyelitis identified. Mild hallux valgus deformity with mild degenerative changes  at the first MTP joint. Mild dorsal soft tissue edema. IMPRESSION: 1. No acute findings. No signs of osteomyelitis. 2. 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 Electronically Signed   By: Signa Kell M.D.   On: 11/10/2022 10:00    ECHO 11/10/2018 MILD LV SYSTOLIC DYSFUNCTION WITH AN ESTIMATED EF = 40-45 %  NORMAL RIGHT VENTRICULAR SYSTOLIC FUNCTION  MILD-TO-MODERATE MITRAL VALVE INSUFFICIENCY  TRACE TRICUSPID VALVE INSUFFICIENCY  NO VALVULAR STENOSIS  INFERIOR WALL HYPOKINESIS  _________________________________________________________________________________________  Electronically signed by      MD Arnoldo Hooker on 11/10/2018 10: 43 AM           Performed By: Luretha Murphy, RDCS, RVT     Ordering Physician: [name], MD Velna Ochs myoview 11/10/2018 Normal Lexiscan infusion EKG  Mild segmental LV systolic dysfunction with inferior hypokinesis  Fixed and minimal reversible inferior myocardial perfusion defect  consistent with mainly previous infarct with peri-infarct myocardial  ischemia  Clinical correlation advised   Arnoldo Hooker   TELEMETRY reviewed by me (LT) 11/22/2022 : Sinus rhythm rate 60s  EKG reviewed by me: NSR 70, nonspec IVCD, hx old inferior infarct  Data reviewed by me (LT) 11/22/2022: vascular surgery notes, hospitalist progress notes, psych notes last 24h vitals tele labs imaging I/O    Principal Problem:   Ischemic ulcer of lower extremity (HCC) Active Problems:   Hypertension   Coronary artery disease with history of myocardial infarction without history of CABG   Diabetes mellitus type 2, insulin dependent (HCC)   Diabetic polyneuropathy associated with type 2 diabetes mellitus (HCC)   Mixed hyperlipidemia   Alcohol abuse   Limb ischemia   Acute on chronic combined systolic and diastolic CHF (congestive heart failure) (HCC)   Adjustment disorder with depressed mood    ASSESSMENT AND PLAN:  Bane P. 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 Bilateral LE revascularization with Dr. Wyn Quaker was planned on 7/24 but cancelled 2/2 dyspnea and heart failure symptoms. Cardiology is consulted for assistance with the patients heart failure and medical optimization prior to vascular surgery. Echo this admission showed reduced EF 25-30% with multiple rWMAs, mild to moderate MR. Now s/p bilateral common femoral, profunda femoris, SFA endarterectomies, and bilateral common iliac artery kissing expandable stents, and left external iliac stenting x 2 performed on 7/26.  Also s/p right fourth and fifth toe amputations performed 7/29.  # Severe PAD with lower extremity ulceration -Agree with current therapy per primary, vascular surgery, and podiatry -On broad-spectrum Unasyn  # Acute on chronic HFrEF Developed dyspnea/orthopnea on 7/23 and was given IV Lasix with improvement, vascular surgery intervention canceled for 7/24 due to his heart failure exacerbation. Repeat chest x-ray with trace bilateral pleural effusions, BNP was elevated in the 900s.  S/p IV Lasix  BID for multiple days with clinical improvement. D/t increased Cr, primary team discontinued diuretics from 7/28-7/30.  On 8/1: Continued hypervolemia on exam with scrotal edema, and 1-2+ pitting edema bilaterally and an oxygen requirement this morning. -Continue IV Lasix 60 mg twice daily, plan to reassess  tomorrow.  Likely discharge on a daily loop diuretic (20 mg torsemide daily) -Stressed the importance of complete cessation from alcohol and limiting salt intake -Scrotal U/S with diffuse scrotal edema -Continue Coreg 12.5 mg twice  daily -GDMT with ARB/ARNI, SGLT2i, MRA presently limited by low normal BP and AKI.  He was started on losartan and spironolactone earlier this hospitalization but these have since been discontinued due to worsening renal function.   -Stop home amlodipine 5 mg daily -Patient was educated on worsening heart function and heart failure in general, he seems somewhat reluctant to follow-up in clinic and have the burden of seeing multiple specialists to "have his life revolve around seeing a different doctor each day."  We will schedule him an appointment to follow-up with Dr. Lorelle Gibbs 1-2 weeks following hospital discharge.   This patient's plan of care was discussed and created with Dr. Darrold Junker and he is in agreement.  Signed: Gale Journey , PA-C 11/22/2022, 2:14 PM [facility]

## 2022-11-22 NOTE — Progress Notes (Signed)
Pt refused Coreg, ascorbic acid, pepcid but was educated about its importance. Will continue to monitor.

## 2022-11-22 NOTE — Plan of Care (Signed)
  Problem: Education: Goal: Knowledge of General Education information will improve Description: Including pain rating scale, medication(s)/side effects and non-pharmacologic comfort measures Outcome: Progressing   Problem: Health Behavior/Discharge Planning: Goal: Ability to manage health-related needs will improve Outcome: Progressing   Problem: Clinical Measurements: Goal: Ability to maintain clinical measurements within normal limits will improve Outcome: Progressing Goal: Will remain free from infection Outcome: Progressing Goal: Diagnostic test results will improve Outcome: Progressing Goal: Respiratory complications will improve Outcome: Progressing Goal: Cardiovascular complication will be avoided Outcome: Progressing   Problem: Activity: Goal: Risk for activity intolerance will decrease Outcome: Progressing   Problem: Nutrition: Goal: Adequate nutrition will be maintained Outcome: Progressing   Problem: Coping: Goal: Level of anxiety will decrease Outcome: Progressing   Problem: Elimination: Goal: Will not experience complications related to bowel motility Outcome: Progressing Goal: Will not experience complications related to urinary retention Outcome: Progressing   Problem: Pain Managment: Goal: General experience of comfort will improve Outcome: Progressing   Problem: Safety: Goal: Ability to remain free from injury will improve Outcome: Progressing   Problem: Skin Integrity: Goal: Risk for impaired skin integrity will decrease Outcome: Progressing   Problem: Education: Goal: Ability to describe self-care measures that may prevent or decrease complications (Diabetes Survival Skills Education) will improve Outcome: Progressing Goal: Individualized Educational Video(s) Outcome: Progressing   Problem: Coping: Goal: Ability to adjust to condition or change in health will improve Outcome: Progressing   Problem: Fluid Volume: Goal: Ability to  maintain a balanced intake and output will improve Outcome: Progressing   Problem: Health Behavior/Discharge Planning: Goal: Ability to identify and utilize available resources and services will improve Outcome: Progressing Goal: Ability to manage health-related needs will improve Outcome: Progressing   Problem: Metabolic: Goal: Ability to maintain appropriate glucose levels will improve Outcome: Progressing   Problem: Nutritional: Goal: Maintenance of adequate nutrition will improve Outcome: Progressing Goal: Progress toward achieving an optimal weight will improve Outcome: Progressing   Problem: Skin Integrity: Goal: Risk for impaired skin integrity will decrease Outcome: Progressing   Problem: Tissue Perfusion: Goal: Adequacy of tissue perfusion will improve Outcome: Progressing   Problem: Education: Goal: Knowledge of prescribed regimen will improve Outcome: Progressing   Problem: Activity: Goal: Ability to tolerate increased activity will improve Outcome: Progressing   Problem: Bowel/Gastric: Goal: Gastrointestinal status for postoperative course will improve Outcome: Progressing   Problem: Clinical Measurements: Goal: Postoperative complications will be avoided or minimized Outcome: Progressing Goal: Signs and symptoms of graft occlusion will improve Outcome: Progressing   Problem: Skin Integrity: Goal: Demonstration of wound healing without infection will improve Outcome: Progressing

## 2022-11-22 NOTE — Progress Notes (Signed)
Physical Therapy Treatment Patient Details Name: Manuel [name] MRN: [id number] DOB: 1945-07-13 Today's Date: 11/22/2022   History of Present Illness Pt is a 77 year old male presenting with right lower extremity ischemia and ulcer formation with gangrenous toe now s/p left common femoral, profunda femoris, and superficial femoral artery endarterectomies, Right common femoral, profunda femoris, and superficial femoral artery endarterectomies, stent placements in bilateral common iliac arteries, Additional stent placement x 2 to the left external iliac artery, Angioplasty of the right posterior tibial artery, Angioplasty of the right popliteal artery on 11/15/22;   Pmh significant for insulin-dependent DM, HTN, CAD, PAD s/p angioplasty with stent 05/14/2021. 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 Tangential throughout session and requires cues for redirection. Complaining of R LE pain and gas pains during session. Required min guard for sit to stand from recliner and ambulation with room with RW. Cues required to ensure heel WB on R during mobility. Returned to recliner at end of session and made comfortable. Discharge plan remains appropriate.     If plan is discharge home, recommend the following: A little help with walking and/or transfers;A little help with bathing/dressing/bathroom;Assistance with cooking/housework;Assist for transportation;Help with stairs or ramp for entrance   Can travel by private vehicle        Equipment Recommendations  Rolling Rodger Giangregorio (2 wheels);BSC/3in1    Recommendations for Other Services       Precautions / Restrictions Precautions Precautions: Fall Precaution Comments: B wound vacs Restrictions Weight Bearing Restrictions: Yes RLE Weight Bearing: Partial weight bearing RLE Partial Weight Bearing Percentage or Pounds: Per note from Dr. Alberteen Spindle: Pt  may be partial weightbearing in his OrthoWedge surgical shoe using crutches or Erico Stan with pressure only on the heel of the shoe.  Minimal weightbearing for the first couple of weeks. Other Position/Activity Restrictions: WB thru heel in ortho wedge shoe     Mobility  Bed Mobility               General bed mobility comments: in recliner chair on arrival    Transfers Overall transfer level: Needs assistance Equipment used: Rolling Carless Slatten (2 wheels) Transfers: Sit to/from Stand Sit to Stand: Min guard           General transfer comment: increased time to come into standing    Ambulation/Gait Ambulation/Gait assistance: Min guard Gait Distance (Feet): 25 Feet Assistive device: Rolling Gregorey Nabor (2 wheels) Gait Pattern/deviations: Step-to pattern Gait velocity: decreased     General Gait Details: min guard for safety. Cues for heel WB on R. Slow gait speed   Stairs             Wheelchair Mobility     Tilt Bed    Modified Rankin (Stroke Patients Only)       Balance Overall balance assessment: Needs assistance Sitting-balance support: Feet supported Sitting balance-Leahy Scale: Good     Standing balance support: Bilateral upper extremity supported, During functional activity, Reliant on assistive device for balance Standing balance-Leahy Scale: Fair                              Cognition Arousal/Alertness: Awake/alert Behavior During Therapy: Anxious Overall Cognitive Status: Within Functional Limits for tasks assessed  General Comments: tangential in conversation throughout session        Exercises      General Comments        Pertinent Vitals/Pain Pain Assessment Pain Assessment: Faces Faces Pain Scale: Hurts little more Pain Location: RLE & "gas pains" Pain Descriptors / Indicators: Aching, Sharp, Grimacing, Guarding, Discomfort Pain Intervention(s): Limited activity within  patient's tolerance, Monitored during session, Repositioned    Home Living                          Prior Function            PT Goals (current goals can now be found in the care plan section) Acute Rehab PT Goals Patient Stated Goal: return home PT Goal Formulation: With patient Time For Goal Achievement: 12/03/22 Potential to Achieve Goals: Good Progress towards PT goals: Progressing toward goals    Frequency    Min 1X/week      PT Plan Current plan remains appropriate    Co-evaluation              AM-PAC PT "6 Clicks" Mobility   Outcome Measure  Help needed turning from your back to your side while in a flat bed without using bedrails?: None Help needed moving from lying on your back to sitting on the side of a flat bed without using bedrails?: A Little Help needed moving to and from a bed to a chair (including a wheelchair)?: A Little Help needed standing up from a chair using your arms (e.g., wheelchair or bedside chair)?: A Little Help needed to walk in hospital room?: A Little Help needed climbing 3-5 steps with a railing? : Total 6 Click Score: 17    End of Session   Activity Tolerance: Patient tolerated treatment well Patient left: in chair;with call bell/phone within reach;with family/visitor present Nurse Communication: Mobility status PT Visit Diagnosis: Other abnormalities of gait and mobility (R26.89);Muscle weakness (generalized) (M62.81);Unsteadiness on feet (R26.81);Difficulty in walking, not elsewhere classified (R26.2)     Time: 4010-2725 PT Time Calculation (min) (ACUTE ONLY): 21 min  Charges:    $Therapeutic Activity: 8-22 mins PT General Charges $$ ACUTE PT VISIT: 1 Visit                     Maylon Peppers, PT, DPT Physical Therapist - [facility]  [facility]    [name] 11/22/2022, 10:20 AM

## 2022-11-23 DIAGNOSIS — E1142 Type 2 diabetes mellitus with diabetic polyneuropathy: Secondary | ICD-10-CM | POA: Diagnosis not present

## 2022-11-23 DIAGNOSIS — I96 Gangrene, not elsewhere classified: Secondary | ICD-10-CM | POA: Diagnosis not present

## 2022-11-23 DIAGNOSIS — I2510 Atherosclerotic heart disease of native coronary artery without angina pectoris: Secondary | ICD-10-CM | POA: Diagnosis not present

## 2022-11-23 DIAGNOSIS — I5043 Acute on chronic combined systolic (congestive) and diastolic (congestive) heart failure: Secondary | ICD-10-CM | POA: Diagnosis not present

## 2022-11-23 DIAGNOSIS — L97909 Non-pressure chronic ulcer of unspecified part of unspecified lower leg with unspecified severity: Secondary | ICD-10-CM | POA: Diagnosis not present

## 2022-11-23 DIAGNOSIS — I10 Essential (primary) hypertension: Secondary | ICD-10-CM | POA: Diagnosis not present

## 2022-11-23 DIAGNOSIS — F1010 Alcohol abuse, uncomplicated: Secondary | ICD-10-CM | POA: Diagnosis not present

## 2022-11-23 LAB — GLUCOSE, CAPILLARY
Glucose-Capillary: 196 mg/dL — ABNORMAL HIGH (ref 70–99)
Glucose-Capillary: 192 mg/dL — ABNORMAL HIGH (ref 70–99)
Glucose-Capillary: 199 mg/dL — ABNORMAL HIGH (ref 70–99)
Glucose-Capillary: 256 mg/dL — ABNORMAL HIGH (ref 70–99)

## 2022-11-23 MED ORDER — LOSARTAN POTASSIUM 25 MG PO TABS
25.0000 mg | ORAL_TABLET | Freq: Every day | ORAL | Status: DC
  Administered 2022-11-23: 25 mg via ORAL
  Filled 2022-11-23: qty 1

## 2022-11-23 MED ORDER — SPIRONOLACTONE 12.5 MG HALF TABLET
12.5000 mg | ORAL_TABLET | Freq: Every day | ORAL | Status: DC
  Administered 2022-11-23: 12.5 mg via ORAL
  Filled 2022-11-23 (×2): qty 1

## 2022-11-23 NOTE — Progress Notes (Signed)
Occupational Therapy Treatment Patient Details Name: Manuel [name] MRN: [id number] DOB: December 11, 1945 Today's Date: 11/23/2022   History of present illness Pt is a 77 year old male presenting with right lower extremity ischemia and ulcer formation with gangrenous toe now s/p left common femoral, profunda femoris, and superficial femoral artery endarterectomies, Right common femoral, profunda femoris, and superficial femoral artery endarterectomies, stent placements in bilateral common iliac arteries, Additional stent placement x 2 to the left external iliac artery, Angioplasty of the right posterior tibial artery, Angioplasty of the right popliteal artery on 11/15/22;   Pmh significant for insulin-dependent DM, HTN, CAD, PAD s/p angioplasty with stent 05/14/2021. 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 Pt needing total A to don R darco shoe for session. Pt does continue to have tangential speech throughout but is easily redirected to task and is very pleasant and cooperative during session. Pt stands with min guard from EOB with use of RW. Pt ambulates 15' in room with RW and min guard progressing to close supervision. Pt enters bathroom and sits on standard commode for toileting needs. Pt attempting to have BM and family member remains in room. NT notified that pt will call when he is ready to stand from commode. All needs within reach.    Recommendations for follow up therapy are one component of a multi-disciplinary discharge planning process, led by the attending physician.  Recommendations may be updated based on patient status, additional functional criteria and insurance authorization.    Assistance Recommended at Discharge Frequent or constant Supervision/Assistance  Patient can return home with the following  A lot of help with walking  and/or transfers;A lot of help with bathing/dressing/bathroom;Assistance with cooking/housework;Direct supervision/assist for medications management;Direct supervision/assist for financial management;Assist for transportation;Help with stairs or ramp for entrance   Equipment Recommendations  BSC/3in1;Other (comment) (RW)       Precautions / Restrictions Precautions Precautions: Fall Precaution Comments: B wound vacs Restrictions Weight Bearing Restrictions: Yes RLE Weight Bearing: Partial weight bearing RLE Partial Weight Bearing Percentage or Pounds: Per note from Dr. Alberteen Spindle: Pt may be partial weightbearing in his OrthoWedge surgical shoe using crutches or walker with pressure only on the heel of the shoe.  Minimal weightbearing for the first couple of weeks. Other Position/Activity Restrictions: WB thru heel in ortho wedge shoe       Mobility Bed Mobility               General bed mobility comments: seated on EOB at arrival    Transfers Overall transfer level: Needs assistance Equipment used: Rolling walker (2 wheels) Transfers: Sit to/from Stand Sit to Stand: Min guard                 Balance Overall balance assessment: Needs assistance Sitting-balance support: Feet supported Sitting balance-Leahy Scale: Good     Standing balance support: Bilateral upper extremity supported, During functional activity, Reliant on assistive device for balance Standing balance-Leahy Scale: Fair                             ADL either performed or assessed with clinical judgement   ADL Overall ADL's : Needs assistance/impaired  Toilet Transfer: Minimal assistance;Regular Toilet;Rolling walker (2 wheels);Ambulation                  Extremity/Trunk Assessment Upper Extremity Assessment Upper Extremity Assessment: Overall WFL for tasks assessed   Lower Extremity Assessment Lower Extremity Assessment: Generalized weakness         Vision Patient Visual Report: No change from baseline            Cognition Arousal/Alertness: Awake/alert Behavior During Therapy: Anxious Overall Cognitive Status: Within Functional Limits for tasks assessed                                 General Comments: tangential in conversation throughout session but easily redirected to task                   Pertinent Vitals/ Pain       Pain Assessment Pain Assessment: Faces Faces Pain Scale: Hurts a little bit Pain Location: R LE Pain Descriptors / Indicators: Aching, Sharp, Grimacing, Guarding, Discomfort Pain Intervention(s): Limited activity within patient's tolerance, Monitored during session, Repositioned         Frequency  Min 1X/week        Progress Toward Goals  OT Goals(current goals can now be found in the care plan section)  Progress towards OT goals: Progressing toward goals     Plan Discharge plan remains appropriate;Frequency remains appropriate       AM-PAC OT "6 Clicks" Daily Activity     Outcome Measure   Help from another person eating meals?: None Help from another person taking care of personal grooming?: None Help from another person toileting, which includes using toliet, bedpan, or urinal?: A Little Help from another person bathing (including washing, rinsing, drying)?: A Little Help from another person to put on and taking off regular upper body clothing?: None Help from another person to put on and taking off regular lower body clothing?: A Lot 6 Click Score: 20    End of Session Equipment Utilized During Treatment: Rolling walker (2 wheels)  OT Visit Diagnosis: Other abnormalities of gait and mobility (R26.89);Unsteadiness on feet (R26.81);Muscle weakness (generalized) (M62.81)   Activity Tolerance Patient tolerated treatment well   Patient Left in bed;with call bell/phone within reach;with bed alarm set;with family/visitor present   Nurse Communication Other  (comment) (notified NT that pt was on toilet for BM and wife present in room)        Time: 9604-5409 OT Time Calculation (min): 14 min  Charges: OT General Charges $OT Visit: 1 Visit OT Treatments $Self Care/Home Management : 8-22 mins  Jackquline Denmark, MS, OTR/L , CBIS ascom [phone]  11/23/22, 11:34 AM

## 2022-11-23 NOTE — Progress Notes (Signed)
[facility] CARDIOLOGY CONSULT NOTE       Patient ID: Manuel [name] MRN: [id number] DOB/AGE: [birth year] 77 y.o.  Admit date: 11/10/2022 Referring Physician Dr. Arnetha Courser  Primary Physician Dr. Marcelino Duster  Primary Cardiologist Dr. Tiajuana Amass  Reason for Consultation AoCHF  HPI: Edyth Gunnels. 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 Bilateral LE revascularization with Dr. Wyn Quaker was planned on 7/24 but cancelled 2/2 dyspnea and heart failure symptoms. Cardiology is consulted for assistance with the patients heart failure and medical optimization prior to vascular surgery. Echo this admission showed reduced EF 25-30% with multiple rWMAs, mild to moderate MR. Now s/p bilateral common femoral, profunda femoris, SFA endarterectomies, and bilateral common iliac artery kissing expandable stents, and left external iliac stenting x 2 performed on 7/26.  Also s/p right fourth and fifth toe amputations performed 7/29.  Interval History:  -Patient continues to deny any shortness of breath.  Lower extremity edema mildly improved 1-2+ pitting edema today. -Renal function improved today with creatinine 1.18.  Continues to have good urine output. -Patient reports that scrotal edema has not improved.  Was evaluated by urology yesterday. -Resolution of gas pain since yesterday per patient.  Review of systems complete and found to be negative unless listed above     Past Medical History:  Diagnosis Date   Allergies    Arthritis    Diabetes mellitus without complication (HCC)    Gout    Hard of hearing    Hyperlipidemia    Hypertension    Myocardial  infarction [facility]) 2008   treated here at [facility] and transferred to [place] for stent placement   Neuropathy    knees down   Peripheral vascular disease (HCC)    Sleep apnea    "in the past"   Status post primary angioplasty with coronary stent    Wears dentures    full upper and lower    Past Surgical History:  Procedure Laterality Date   AMPUTATION Right 11/18/2022   Procedure: 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;  Service: Ophthalmology;  Laterality: Right;  Diabetic 16.78 01:39.9   CATARACT EXTRACTION W/PHACO Left 03/28/2021   Procedure: CATARACT EXTRACTION PHACO AND INTRAOCULAR LENS PLACEMENT (IOC) LEFT DIABETIC 6.93 01:22.0;  Surgeon: Lockie Mola, MD;  Location: [facility];  Service: Ophthalmology;  Laterality: Left;  Diabetic   ENDARTERECTOMY FEMORAL Bilateral 11/15/2022   Procedure: BILATERAL COMMON FEMORAL PROFUNDA FEMORIS AND SUPERFICIAL FEMORAL ARTERY ENDARTECTOMIES, RIGHT FOGARTY EMBOLECTOMY OF THE RIGHT SFA  AND  POPLITEAL ARTERIES. AORTAGRAM AND RIGHT LOWER EXTREMITY ANGIOGRAM.;  Surgeon: Annice Needy, MD;  Location: [facility] OR[facility];  Service: Vascular;  Laterality: Bilateral;   INSERTION OF ILIAC STENT Bilateral 11/15/2022   Procedure: BILATERAL STENT INSERTION IN BILATERAL  COMMON ILIAC ARTERY, STENT INSERTION OF LEFT EXTERNAL ILIAC ARTERY. ANGIOPLASTY RIGHT TIBIAL  AND POPLITEAL ARTERY.;  Surgeon: Annice Needy, MD;  Location: [facility];  Service: Vascular;  Laterality: Bilateral;   LOWER EXTREMITY ANGIOGRAPHY Right 05/14/2021   Procedure: LOWER EXTREMITY ANGIOGRAPHY;  Surgeon: Annice Needy, MD;  Location: [facility];  Service: Cardiovascular;   Laterality: Right;   LOWER EXTREMITY ANGIOGRAPHY Right 06/14/2021   Procedure: Lower Extremity Angiography;  Surgeon: Annice Needy, MD;  Location: [facility];  Service: Cardiovascular;  Laterality: Right;   LOWER EXTREMITY ANGIOGRAPHY Right 11/11/2022   Procedure: Lower Extremity Angiography;  Surgeon: Annice Needy, MD;  Location: [facility];  Service: Cardiovascular;  Laterality: Right;   LOWER EXTREMITY INTERVENTION Right 06/15/2021   Procedure: LOWER EXTREMITY INTERVENTION;  Surgeon: Annice Needy, MD;  Location: [facility];  Service: Cardiovascular;  Laterality: Right;   TONSILLECTOMY     had removed as a child    Medications Prior to Admission  Medication Sig Dispense Refill Last Dose   amLODipine (NORVASC) 10 MG tablet Take 10 mg by mouth daily.   Past Week   carvedilol (COREG) 12.5 MG tablet Take 1 tablet by mouth 2 (two) times daily.   Past Week   clopidogrel (PLAVIX) 75 MG tablet Take 1 tablet (75 mg total) by mouth daily. 90 tablet 1 Past Week   gabapentin (NEURONTIN) 400 MG capsule Take 800 mg by mouth 2 (two) times daily.   Past Week   insulin aspart protamine- aspart (NOVOLOG MIX 70/30) (70-30) 100 UNIT/ML injection Inject 32 Units into the skin 2 (two) times daily with a meal.   Past Week   lisinopril (ZESTRIL) 20 MG tablet Take 1 tablet (20 mg total) by mouth daily. 30 tablet 1 Past Week   apixaban (ELIQUIS) 5 MG TABS tablet Take 1 tablet (5 mg total) by mouth 2 (two) times daily. (Patient not taking: Reported on 01/25/2022) 60 tablet 1 Not Taking   aspirin EC 81 MG tablet Take 1 tablet (81 mg total) by mouth daily. (Patient not taking: Reported on 08/13/2022) 150 tablet 2 Not Taking   atorvastatin (LIPITOR) 80 MG tablet Take 1 tablet (80 mg total) by mouth daily. (Patient not taking: Reported on 11/11/2022) 30 tablet 1 Not Taking   cefdinir (OMNICEF) 300 MG capsule Take 300 mg by mouth 2 (two) times daily. (Patient not taking: Reported on 11/11/2022)   Not  Taking   DULoxetine (CYMBALTA) 30 MG capsule Take 30 mg by mouth daily. (Patient not taking: Reported on 07/11/2021)      fluticasone (FLONASE) 50 MCG/ACT nasal spray Place 2 sprays into both nostrils as needed for allergies or rhinitis. (Patient not taking: Reported on 07/11/2021)      gabapentin (NEURONTIN) 100 MG capsule Take 100 mg by mouth at bedtime. (Patient not taking: Reported on 11/11/2022)   Not Taking   Ibuprofen 200 MG CAPS Take 2 tablets by mouth 3 times/day as needed-between meals & bedtime. (Patient not taking: Reported on 11/11/2022)   Not Taking   levocetirizine (XYZAL) 5 MG tablet Take 5 mg by mouth at bedtime. (Patient not taking: Reported on 08/13/2022)   Not Taking   polyethylene glycol (MIRALAX / GLYCOLAX) 17 g packet Take 17 g by mouth daily. (Patient not taking: Reported on 08/13/2022) 14 each 0    senna-docusate (SENOKOT-S) 8.6-50 MG tablet Take 1 tablet by mouth at bedtime as needed for mild constipation. 30 tablet 0 prn   zolpidem (AMBIEN) 5 MG tablet Take 1 tablet (5 mg total) by mouth at bedtime as needed for up to 5  doses for sleep. (Patient not taking: Reported on 07/11/2021) 5 tablet 0 Not Taking   Social History   Socioeconomic History   Marital status: Married    Spouse name: Not on file   Number of children: Not on file   Years of education: Not on file   Highest education level: Not on file  Occupational History   Not on file  Tobacco Use   Smoking status: Never   Smokeless tobacco: Never  Vaping Use   Vaping status: Never Used  Substance and Sexual Activity   Alcohol use: Yes    Alcohol/week: 21.0 standard drinks of alcohol    Types: 7 Glasses of wine, 14 Cans of beer per week    Comment: occassional   Drug use: No   Sexual activity: Not on file  Other Topics Concern   Not on file  Social History Narrative   Not on file   Social Determinants of Health   Financial Resource Strain: Not on file  Food Insecurity: No Food Insecurity (11/10/2022)    Hunger Vital Sign    Worried About Running Out of Food in the Last Year: Never true    Ran Out of Food in the Last Year: Never true  Transportation Needs: No Transportation Needs (11/10/2022)   PRAPARE - Administrator, Civil Service (Medical): No    Lack of Transportation (Non-Medical): No  Physical Activity: Not on file  Stress: Not on file  Social Connections: Not on file  Intimate Partner Violence: Not At Risk (11/10/2022)   Humiliation, Afraid, Rape, and Kick questionnaire    Fear of Current or Ex-Partner: No    Emotionally Abused: No    Physically Abused: No    Sexually Abused: No    Family History  Problem Relation Age of Onset   Scoliosis Mother    Heart disease Father       Intake/Output Summary (Last 24 hours) at 11/23/2022 1144 Last data filed at 11/23/2022 0500 Gross per 24 hour  Intake --  Output 300 ml  Net -300 ml    Vitals:   11/22/22 1932 11/23/22 0428 11/23/22 0500 11/23/22 0759  BP: (!) 140/66 139/64  137/64  Pulse: 62 (!) 59  (!) 59  Resp: 12 18  16   Temp: 99.2 F (37.3 C) 98 F (36.7 C)  97.8 F (36.6 C)  TempSrc: Oral   Oral  SpO2: 98% 95%  97%  Weight:   117.2 kg   Height:        PHYSICAL EXAM General: Pleasant conversational elderly male, well nourished, in no acute distress. Sitting upright on side of hospital bed. HEENT:  Normocephalic and atraumatic. Neck:  No JVD.  Lungs: Normal respiratory effort on room air trace bibasilar crackles without appreciable wheezes.   Heart: HRRR . Normal S1 and S2 without gallops or murmurs.  Abdomen: Non-distended appearing adiposity.  Msk: Normal strength and tone for age. Extremities: 1-2+ bilateral lower extremity edema. surgical shoe and dressing applied to right lower extremity.   Neuro: Alert and oriented X 3. Psych:  Answers questions appropriately.   Labs: Basic Metabolic Panel: Recent Labs    11/22/22 0518 11/23/22 1053  NA 131* 134*  K 4.6 4.1  CL 97* 100  CO2 25 24  GLUCOSE  167* 212*  BUN 55* 39*  CREATININE 1.71* 1.18  CALCIUM 8.4* 8.7*  MG  --  2.3   Liver Function Tests: No results for input(s): "AST", "ALT", "ALKPHOS", "BILITOT", "PROT", "ALBUMIN"  in the last 72 hours. No results for input(s): "LIPASE", "AMYLASE" in the last 72 hours. CBC: Recent Labs    11/22/22 0518 11/23/22 1053  WBC 8.7 9.1  HGB 8.3* 8.9*  HCT 24.0* 26.7*  MCV 87.6 89.6  PLT 416* 458*   Cardiac Enzymes: No results for input(s): "CKTOTAL", "CKMB", "CKMBINDEX", "TROPONINIHS" in the last 72 hours. BNP: No results for input(s): "BNP" in the last 72 hours.  D-Dimer: No results for input(s): "DDIMER" in the last 72 hours. Hemoglobin A1C: No results for input(s): "HGBA1C" in the last 72 hours. Fasting Lipid Panel: No results for input(s): "CHOL", "HDL", "LDLCALC", "TRIG", "CHOLHDL", "LDLDIRECT" in the last 72 hours. Thyroid Function Tests: No results for input(s): "TSH", "T4TOTAL", "T3FREE", "THYROIDAB" in the last 72 hours.  Invalid input(s): "FREET3" Anemia Panel: No results for input(s): "VITAMINB12", "FOLATE", "FERRITIN", "TIBC", "IRON", "RETICCTPCT" in the last 72 hours.   Radiology: US SCROTUM W/DOPPLER  Result Date: 11/21/2022 CLINICAL DATA:  86310 Swelling [id number] EXAM: SCROTAL ULTRASOUND DOPPLER ULTRASOUND OF THE TESTICLES TECHNIQUE: Complete ultrasound examination of the testicles, epididymis, and other scrotal structures was performed. Color and spectral Doppler ultrasound were also utilized to evaluate blood flow to the testicles. COMPARISON:  None Available. FINDINGS: Right testicle Measurements: 4.1 x 3.1 x 3.0 cm. No mass or microlithiasis visualized. Left testicle Measurements: 4.2 x 3.0 x 3.0 cm. No mass or microlithiasis visualized. Right epididymis: Mildly heterogeneous and expanded in appearance measuring 1.0 x 1.4 x 1.6 cm. Left epididymis: Mildly heterogeneous and expanded appearance measuring 1.0 x 1.2 x 1.9 cm. Hydrocele:  Trace peritesticular fluid.  Varicocele:  None visualized. Pulsed Doppler interrogation of both testes demonstrates normal low resistance arterial and venous waveforms bilaterally. Other: Soft tissue edema and dermal thickening. On several of the cine images, there is possible echogenic areas with posterior dirty shadowing in the posterior scrotal soft tissues (for example series 10 image 40) IMPRESSION: 1. There is diffuse scrotal edema and dermal thickening. There are possible echogenic areas with posterior dirty shadowing in the posterior scrotal soft tissues suggestive of air. This is nonspecific and could reflect artifact or trapped air but subcutaneous air is in the differential. Recommend correlation with physical exam with consideration of dedicated CT low pelvis to evaluate for intradermal air if there is concern for Fournier's gangrene. 2. The epididymides are mildly heterogeneous and expanded in appearance. This is nonspecific and likely due to chronic inflammation or sequela of prior infection. 3. No sonographic evidence of testicular torsion. These results will be called to the ordering clinician or representative by the Radiologist Assistant, and communication documented in the PACS or [employer]. Electronically Signed   By: Meda Klinefelter M.D.   On: 11/21/2022 14:11   DG C-Arm 1-60 Min-No Report  Result Date: 11/15/2022 Fluoroscopy was utilized by the requesting physician.  No radiographic interpretation.   ECHOCARDIOGRAM COMPLETE  Result Date: 11/14/2022    ECHOCARDIOGRAM REPORT   Patient Name:   MALCOME AMBROCIO Date of Exam: 11/14/2022 Medical Rec #:  [id number]        Height:       68.0 in Accession #:    [id number]       Weight:       191.6 lb Date of Birth:  1945/11/24        BSA:          2.007 m Patient Age:    77 years         BP:  151/83 mmHg Patient Gender: M                HR:           88 bpm. Exam Location:  [facility] Procedure: 2D Echo, Cardiac Doppler, Color Doppler and Strain Analysis  Indications:     Dyspnea R06.00  History:         Patient has no prior history of Echocardiogram examinations.                  Previous Myocardial Infarction; Risk Factors:Hypertension and                  Diabetes.  Sonographer:     Cristela Blue Referring Phys:  [id number] Tonna Corner MICHELLE TANG Diagnosing Phys: Marcina Millard MD  Sonographer Comments: Global longitudinal strain was attempted. IMPRESSIONS  1. Left ventricular ejection fraction, by estimation, is 25 to 30%. The left ventricle has severely decreased function. The left ventricle demonstrates regional wall motion abnormalities (see scoring diagram/findings for description). Left ventricular diastolic parameters were normal.  2. Right ventricular systolic function is normal. The right ventricular size is normal.  3. The mitral valve is normal in structure. Mild to moderate mitral valve regurgitation. No evidence of mitral stenosis.  4. Tricuspid valve regurgitation is mild to moderate.  5. The aortic valve is normal in structure. Aortic valve regurgitation is not visualized. No aortic stenosis is present.  6. The inferior vena cava is normal in size with greater than 50% respiratory variability, suggesting right atrial pressure of 3 mmHg. FINDINGS  Left Ventricle: Left ventricular ejection fraction, by estimation, is 25 to 30%. The left ventricle has severely decreased function. The left ventricle demonstrates regional wall motion abnormalities. The left ventricular internal cavity size was normal  in size. There is no left ventricular hypertrophy. Left ventricular diastolic parameters were normal.  LV Wall Scoring: The basal anteroseptal segment, apical lateral segment, apical septal segment, and apex are akinetic. The mid anterolateral segment is hypokinetic. Right Ventricle: The right ventricular size is normal. No increase in right ventricular wall thickness. Right ventricular systolic function is normal. Left Atrium: Left atrial size was normal in  size. Right Atrium: Right atrial size was normal in size. Pericardium: There is no evidence of pericardial effusion. Mitral Valve: The mitral valve is normal in structure. Mild to moderate mitral valve regurgitation. No evidence of mitral valve stenosis. Tricuspid Valve: The tricuspid valve is normal in structure. Tricuspid valve regurgitation is mild to moderate. No evidence of tricuspid stenosis. Aortic Valve: The aortic valve is normal in structure. Aortic valve regurgitation is not visualized. No aortic stenosis is present. Aortic valve mean gradient measures 2.0 mmHg. Aortic valve peak gradient measures 4.2 mmHg. Aortic valve area, by VTI measures 2.55 cm. Pulmonic Valve: The pulmonic valve was normal in structure. Pulmonic valve regurgitation is not visualized. No evidence of pulmonic stenosis. Aorta: The aortic root is normal in size and structure. Venous: The inferior vena cava is normal in size with greater than 50% respiratory variability, suggesting right atrial pressure of 3 mmHg. IAS/Shunts: No atrial level shunt detected by color flow Doppler.  LEFT VENTRICLE PLAX 2D LVIDd:         4.60 cm      Diastology LVIDs:         4.00 cm      LV e' medial:    3.70 cm/s LV PW:         1.00 cm  LV E/e' medial:  30.3 LV IVS:        1.30 cm      LV e' lateral:   6.09 cm/s LVOT diam:     2.00 cm      LV E/e' lateral: 18.4 LV SV:         44 LV SV Index:   22 LVOT Area:     3.14 cm  LV Volumes (MOD) LV vol d, MOD A2C: 156.0 ml LV vol d, MOD A4C: 191.0 ml LV vol s, MOD A2C: 119.0 ml LV vol s, MOD A4C: 134.0 ml LV SV MOD A2C:     37.0 ml LV SV MOD A4C:     191.0 ml LV SV MOD BP:      41.9 ml RIGHT VENTRICLE RV Basal diam:  4.40 cm RV Mid diam:    3.50 cm LEFT ATRIUM             Index        RIGHT ATRIUM           Index LA diam:        3.90 cm 1.94 cm/m   RA Area:     12.60 cm LA Vol (A2C):   68.0 ml 33.89 ml/m  RA Volume:   28.00 ml  13.95 ml/m LA Vol (A4C):   57.5 ml 28.65 ml/m LA Biplane Vol: 63.6 ml 31.69  ml/m  AORTIC VALVE AV Area (Vmax):    2.43 cm AV Area (Vmean):   2.51 cm AV Area (VTI):     2.55 cm AV Vmax:           102.00 cm/s AV Vmean:          67.500 cm/s AV VTI:            0.174 m AV Peak Grad:      4.2 mmHg AV Mean Grad:      2.0 mmHg LVOT Vmax:         79.00 cm/s LVOT Vmean:        53.900 cm/s LVOT VTI:          0.141 m LVOT/AV VTI ratio: 0.81  AORTA Ao Root diam: 3.50 cm MITRAL VALVE MV Area (PHT): 5.02 cm     SHUNTS MV Decel Time: 151 msec     Systemic VTI:  0.14 m MV E velocity: 112.00 cm/s  Systemic Diam: 2.00 cm MV A velocity: 79.10 cm/s MV E/A ratio:  1.42 Marcina Millard MD Electronically signed by Marcina Millard MD Signature Date/Time: 11/14/2022/1:14:40 PM    Final    DG Chest Port 1 View  Result Date: 11/12/2022 CLINICAL DATA:  Shortness of breath EXAM: PORTABLE CHEST 1 VIEW COMPARISON:  Prior chest x-ray 11/09/2021 FINDINGS: Diffuse irregular interstitial prominence with areas of nodularity throughout the lungs. Small bilateral pleural effusions and associated bibasilar atelectasis. Cardiac and mediastinal contours are within normal limits. Atherosclerotic calcifications are visible in the transverse aorta. No pneumothorax. No acute osseous abnormality. Bilateral glenohumeral joint osteoarthritis. IMPRESSION: Diffuse interstitial prominence with areas of nodularity throughout both lungs. Differential considerations include an acute atypical infectious/inflammatory process such as viral pneumonia versus atypical pulmonary edema. Trace bilateral pleural effusions and associated bibasilar atelectasis. Normal heart size. Electronically Signed   By: Malachy Moan M.D.   On: 11/12/2022 13:39   PERIPHERAL VASCULAR CATHETERIZATION  Result Date: 11/11/2022 See surgical note for result.  DG Foot Complete Right  Result Date: 11/10/2022 CLINICAL DATA:  Toe ulcers.  Evaluate for  osteomyelitis. EXAM: RIGHT FOOT COMPLETE - 3+ VIEW COMPARISON:  None Available. FINDINGS: There are  no signs of acute fracture or dislocation. No focal bone erosions of osteomyelitis identified. Mild hallux valgus deformity with mild degenerative changes at the first MTP joint. Mild dorsal soft tissue edema. IMPRESSION: 1. No acute findings. No signs of osteomyelitis. 2. 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 Electronically Signed   By: Signa Kell M.D.   On: 11/10/2022 10:00    ECHO 11/10/2018 MILD LV SYSTOLIC DYSFUNCTION WITH AN ESTIMATED EF = 40-45 %  NORMAL RIGHT VENTRICULAR SYSTOLIC FUNCTION  MILD-TO-MODERATE MITRAL VALVE INSUFFICIENCY  TRACE TRICUSPID VALVE INSUFFICIENCY  NO VALVULAR STENOSIS  INFERIOR WALL HYPOKINESIS  _________________________________________________________________________________________  Electronically signed by      MD Arnoldo Hooker on 11/10/2018 10: 43 AM           Performed By: Luretha Murphy, RDCS, RVT     Ordering Physician: [name], MD Velna Ochs myoview 11/10/2018 Normal Lexiscan infusion EKG  Mild segmental LV systolic dysfunction with inferior hypokinesis  Fixed and minimal reversible inferior myocardial perfusion defect  consistent with mainly previous infarct with peri-infarct myocardial  ischemia  Clinical correlation advised   Arnoldo Hooker   TELEMETRY reviewed by me (LT) 11/23/2022 : Not on telemetry  EKG reviewed by me: NSR 70, nonspec IVCD, hx old inferior infarct  Data reviewed by me (LT) 11/23/2022: vascular surgery notes, urology note, hospitalist progress notes, psych notes last 24h vitals tele labs imaging I/O    Principal Problem:   Ischemic ulcer of lower extremity (HCC) Active Problems:   Hypertension   Coronary artery disease with history of myocardial infarction without history of CABG   Diabetes mellitus type 2, insulin dependent (HCC)   Diabetic polyneuropathy associated with type 2 diabetes mellitus (HCC)   Mixed hyperlipidemia   Alcohol abuse   Limb ischemia   Acute on chronic  combined systolic and diastolic CHF (congestive heart failure) (HCC)   Adjustment disorder with depressed mood    ASSESSMENT AND PLAN:  Manuel [name] is a 41yoM with a PMH of CAD s/p PCI mRCA (3 overlapping Xience stents 2011), extensive PAD s/p PTA L common Iliac, R peroneal, R distal SFA/prox popliteal, and stent to R distal SFA/prox popliteal arteries (05/14/21), 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 Bilateral LE revascularization with Dr. Wyn Quaker was planned on 7/24 but cancelled 2/2 dyspnea and heart failure symptoms. Cardiology is consulted for assistance with the patients heart failure and medical optimization prior to vascular surgery. Echo this admission showed reduced EF 25-30% with multiple rWMAs, mild to moderate MR. Now s/p bilateral common femoral, profunda femoris, SFA endarterectomies, and bilateral common iliac artery kissing expandable stents, and left external iliac stenting x 2 performed on 7/26.  Also s/p right fourth and fifth toe amputations performed 7/29.  # Severe PAD with lower extremity ulceration -Agree with current therapy per primary, vascular surgery, and podiatry -On broad-spectrum Unasyn  # Acute on chronic HFrEF Developed dyspnea/orthopnea on 7/23 and was given IV Lasix with improvement, vascular surgery intervention canceled for 7/24 due to his heart failure exacerbation. Repeat chest x-ray with trace bilateral pleural effusions, BNP was elevated in the 900s.  S/p IV Lasix  BID for multiple days with clinical improvement. D/t increased Cr, primary team discontinued diuretics from 7/28-7/30.  On 8/1: Continued hypervolemia on exam with scrotal  edema, and 1-2+ pitting edema bilaterally today. -BP has been stable in the low 130-140s systolic.  Renal function improved with creatinine of 1.18 this morning (1.17 yesterday). -Add low-dose losartan and  low-dose spironolactone for GDMT.  Monitor BP and renal function closely with these additions.  Continue carvedilol 12.5 mg twice daily. -Continue IV Lasix 60 mg twice daily, plan to reassess tomorrow.  Likely discharge on a daily loop diuretic (20 mg torsemide daily) -Stressed the importance of complete cessation from alcohol and limiting salt intake -Scrotal U/S with diffuse scrotal edema.  Urology consult yesterday who recommended continued diuresis and scrotal elevation. -Patient previously taking amlodipine at home, this has been discontinued. -Patient was educated on worsening heart function and heart failure in general, he seems somewhat reluctant to follow-up in clinic and have the burden of seeing multiple specialists to "have his life revolve around seeing a different doctor each day."  We will schedule him an appointment to follow-up with Dr. Lorelle Gibbs 1-2 weeks following hospital discharge.   This patient's plan of care was discussed and created with Dr. Darrold Junker and he is in agreement.  Signed: Gale Journey , PA-C 11/23/2022, 11:44 AM [facility]

## 2022-11-23 NOTE — Progress Notes (Signed)
Daily Progress Note   Patient Name: Manuel [name]       Date: 11/23/2022 DOB: 1945/11/05  Age: 77 y.o. MRN#: [id number] Attending Physician: Delfino Lovett, MD Primary Care Physician: Gracelyn Nurse, MD Admit Date: 11/10/2022  Reason for Consultation/Follow-up: Establishing goals of care  HPI/Brief Hospital Review: 77 y.o. male  with past medical history of T2DM, CAD s/p PCI, HFrEF, PAD s/p stent 2023, daily ETOH use including beer, wine and liquor and PAD admitted from home on 11/10/2022 with right lower extremity ischemia, ulceration with gangrenous toe wound.   7/22 initial vascular intervention: LE angiogram-found to have extensive disease, in need of bilateral femoral endarterectomy and bilateral iliac stents   7/23-7/25 symptoms concerning for fluid overload secondary to HF, echo found EF reduced to 25-30%. Psych consulted for possible SI-cleared.   7/26 Underwent extensive vascular procedure as noted above 7/29 right fourth and fifth toes amputated   7/31-8/1 complications related to constipation-now resolved. Ongoing symptoms related to volume overload-managed with lasix-caution used due to increasing creatinine levels   Reportedly has not had ETOH withdrawal symptoms during hospitalization, endorses daily ETOH consumption-5-6 drinks/day, has consumed ETOH on daily basis for many years   Palliative medicine was consulted for assisting with goals of care conversations  Subjective: Extensive chart review has been completed prior to meeting patient including labs, vital signs, imaging, progress notes, orders, and available advanced directive documents from current and previous encounters.    Visited with Manuel [name] at his bedside. Sitting up in chair, awake, alert, wife-[name] at  bedside initially during time of visit. Manuel [name] shared he had a good night, able to sleep well, pain continues to be well managed. Able to walk with PT earlier today-feels that also went well.  Manuel [name] shares more stories from his past, related to his mother and his children. He again shares he has lived a full and happy life, he continues to struggle with accepting the aging process.  At his request, we again reviewed Advanced Directives, assisted Manuel [name] with completing document. [name] previously assisted with completing HCPOA piece. He again reiterated his wishes are for DNR-allow for a natural death, he would never want to be placed on mechanical ventilation or have feeding tube placed. Again he shares he wife and his children are in agreement with following  and honoring his wishes.  Answered and addressed all questions and concerns. PMT to continue to follow for ongoing needs and support.  Objective:  Physical Exam Constitutional:      General: He is not in acute distress.    Appearance: He is not ill-appearing.  Pulmonary:     Effort: Pulmonary effort is normal. No respiratory distress.  Skin:    General: Skin is warm and dry.  Neurological:     Mental Status: He is alert and oriented to person, place, and time.  Psychiatric:        Mood and Affect: Mood normal.        Thought Content: Thought content normal.        Judgment: Judgment normal.             Vital Signs: BP 137/64 (BP Location: Left Arm)   Pulse (!) 59   Temp 97.8 F (36.6 C) (Oral)   Resp 16   Ht 5\' 8"  (1.727 m)   Wt 117.2 kg   SpO2 97%   BMI 39.29 kg/m  SpO2: SpO2: 97 % O2 Device: O2 Device: Room Air O2 Flow Rate: O2 Flow Rate (L/min): 0 L/min   Palliative Care Assessment & Plan   Assessment/Recommendation/Plan  DNR/DNI Assisted with completion of AD-Chaplain paged to assist with signature, witnesses and notary PMT to continue to follow for ongoing needs and support  Thank you for  allowing the Palliative Medicine Team to assist in the care of this patient.  Total time:  50 minutes  Time spent includes: Detailed review of medical records (labs, imaging, vital signs), medically appropriate exam (mental status, respiratory, cardiac, skin), discussed with treatment team, counseling and educating patient, family and staff, documenting clinical information, medication management and coordination of care.  Leeanne Deed, DNP, AGNP-C Palliative Medicine   Please contact Palliative Medicine Team phone at [phone] for questions and concerns.

## 2022-11-23 NOTE — Progress Notes (Signed)
Progress Note   Patient: Manuel [name] NWG:[id number] DOB: [birth year] DOA: 11/10/2022     13 DOS: the patient was seen and examined on 11/23/2022   Brief hospital course: Taken from H&P.   Manuel [name] is a 77 y.o. male with medical history significant of  insulin-dependent DM, HTN, CAD, PAD s/p angioplasty with stent 05/14/2021 presenting with right lower extremity ischemia and ulcer formation with gangrenous toe.  Presented to the ER afebrile, hemodynamically stable. White count 11.2, hemoglobin 15.2, platelets 303, lactate 1.2, creatinine 0.9. T. bili 1.5. Plain films of the right foot with no signs of osteomyelitis. Dr. [name] with vascular surgery initially consulted with recommendation for heparin drip. Dr. [name] with podiatry also consulted for further evaluation.   7/22: Vital stable.  Going for lower extremity angiography and possible angioplasty with vascular today.  CRP elevated at 10.1, decreased prealbumin at 15 and normal ESR.  Preliminary blood cultures negative.  Continue current broad-spectrum antibiotics.  Found to have very extensive disease and most likely will required bilateral femoral endarterectomies and bilateral iliac stents.  Vascular is looking for a OR time for this extensive procedure which will require 4 to 5 hours.  7/23: Vital and labs stable.  Overnight some shortness of breath and crackles.  One-time 40 mg of IV Lasix was given.  Chest x-ray ordered but patient refused. Patient had a choking spell on meds this morning, continued to feel something sticking in his chest.  No hypoxia.  Chest x-ray with diffuse interstitial prominence and areas of nodularity throughout the both lungs.  Differential include atypical inflammatory process versus atypical pulmonary edema.  Ordered 1 dose of IV Lasix Going for his extensive vascular procedure tomorrow.  7/24: Mildly elevated blood pressure.  BNP elevated at 932, surgery got canceled for concern of acute on chronic  heart failure and patient now need cardiology clearance before proceeding. Family concern of suicidal thoughts, psych was consulted.  7/25: Hemodynamically stable.  Will echocardiogram with EF of 25 to 30% with some regional wall motion abnormalities.  Likely weak going for vascular surgery tomorrow, pending final recommendations from cardiology. Psychiatry with no suicidal thoughts.  7/26: Hemodynamically stable.  Going for vascular procedure today.  7/27: Vital stable.  Patient underwent extensive vascular procedure which involved bilateral lower extremity endarterectomies and multiple stents placement.  Pulses present with Doppler this morning.  Significant blood loss during the procedure, hemoglobin decreased to 11.5 this morning, baseline of 13.  Started on supplement.  7/28: Vital stable.  Labs with increase in creatinine to 1.31-holding Lasix today.  Hemoglobin further decreased to 9.4.  Likely be going for toe amputation tomorrow.  Arterial line removed.  7/29: Going for toes removal today.  Slight worsening of renal function today, holding diuretic and giving some IV fluid as patient is n.p.o.  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  Hemoglobin decreased to 7.8 postsurgically. Patient will be going home with 2 wound VAC in place in his groin areas from vascular surgery and they will remove it in the clinic on 11/25/2022.  Repeat PT and OT evaluation pending but he would like to go home with home health.  If remains stable likely can be discharged tomorrow. Heparin infusion is being discontinued and patient will be started on aspirin and Plavix per vascular surgery.  7/31: Hemodynamically stable.  Physical therapy recommendation remained for home health, which was ordered.  Unasyn can be switched to Augmentin on discharge for 1 more week per podiatry.  Appears volume up again today, worsening renal function and lower extremity edema, remained on room air.  Restarting IV Lasix.   Holding discharge until volume status improves.  8/1: Still having shortness of breath, orthopnea and scrotal swelling. 8/2: Urology consult per patient request.  Discontinued narcotics and other vitamins per patient request.  Had good bowel movement 8/3: DNR now.    Assessment and Plan: * Ischemic ulcer of lower extremity (HCC) Worsening right lower extremity redness swelling and pain over the past 2 weeks in the setting of baseline limb ischemia status post angioplasty and stenting January 2023.Noted worsening ulcer formation across multiple digits of the foot with concern for gangrene Patient reports compliance with Plavix and statin therapy Started on heparin drip in the ER Vascular procedure with extensive peripheral vascular disease, second procedure for bilateral endarterectomies and bilateral iliac stent placement with vascular surgery was done -Stop Augmentin at discharge -S/p fourth and fifth toe amputation on 7/29. -Heparin was discontinued on 7/30 and he was started on aspirin and Plavix per vascular surgery He has bilateral Prevena wound vacs that need to be removed 10 days post op. on 11/25/22. They just get removed and thrown in the trash (he entire unit) per vascular surgery.   Scrotal swelling Due to fluid overload.  Seen by urology recommend scrotal elevation and diuresis Scrotal ultrasound not showing any acute pathology.  Patient is still upset about his scrotal swelling  Acute on chronic combined systolic and diastolic CHF (congestive heart failure) (HCC) According to a prior echo done in 2020, EF of 40 to 45%. Patient with concern of shortness of breath, mild hypoxia and chest x-ray with bilateral opacities.  BNP elevated at 932. Repeat echocardiogram with further decrease of EF to 20 to 25% with regional wall motion abnormalities. -Continues to have scrotal swelling.  Scrotal ultrasound performed and is within normal limit. -Continue Lasix to 60 mg IV twice daily per  cardiology -Daily weight and BMP -Strict intake and output Net IO Since Admission: 1,271.89 mL [11/23/22 1026]   Diabetic polyneuropathy associated with type 2 diabetes mellitus (HCC) Cont neurontin   Coronary artery disease with history of myocardial infarction without history of CABG Followed by Dr. [name] outpt  CAD s/p PCI to [facility] with overlapping 3 mm Xience stents of 23 mm and 12 mm length in 2011  No active chest pain Continue aspirin and Plavix  Hypertension Patient required nitro GGT after the procedure which is off now Continue Coreg, Lasix  Diabetes mellitus type 2, insulin dependent (HCC) SSI  Alcohol abuse 3-4 beer daily drinker + mixed drinks  off CIWA protocol  Follow   Mixed hyperlipidemia Continue statin  Adjustment disorder with depressed mood Family was concerned about some suicidal thoughts so psychiatry was consulted. They cleared him as patient denies any suicidal thoughts, stating that he was just frustrated  Constipation Now resolved He is in agreement to stop all his narcotics as he feels that had been contributing to his constipation   Subjective: Reports shortness of breath, and scrotal swelling.  He is upset about his lights not working properly in the room  Physical Exam: Vitals:   11/22/22 1932 11/23/22 0428 11/23/22 0500 11/23/22 0759  BP: (!) 140/66 139/64  137/64  Pulse: 62 (!) 59  (!) 59  Resp: 12 18  16   Temp: 99.2 F (37.3 C) 98 F (36.7 C)  97.8 F (36.6 C)  TempSrc: Oral   Oral  SpO2: 98% 95%  97%  Weight:  117.2 kg   Height:       General.  Frail elderly man, in no acute distress. Pulmonary.  Lungs clear bilaterally, normal respiratory effort. CV.  Regular rate and rhythm, no JVD, rub or murmur. Abdomen.  Soft, distended, hypoactive bowel sounds CNS.  Alert and oriented .  No focal neurologic deficit. Extremities. 1-2+ LE edema, no cyanosis, palpable pulses on left, Prevena in place, has scrotal  swelling Psychiatry.  Judgment and insight appears normal.    Data Reviewed: Prior data reviewed  Family Communication: No family at bedside Disposition: Status is: Inpatient Remains inpatient appropriate because: Severity of illness  Planned Discharge Destination:  Home   Time spent: 35 minutes  This record has been created using Conservation officer, historic buildings. Errors have been sought and corrected,but may not always be located. Such creation errors do not reflect on the standard of care.   Author: Delfino Lovett, MD 11/23/2022 10:26 AM  For on call review [email or web address].

## 2022-11-23 NOTE — Progress Notes (Signed)
Physical Therapy Treatment Patient Details Name: Manuel [name] MRN: [id number] DOB: 10/30/1945 Today's Date: 11/23/2022   History of Present Illness Pt is a 77 year old male presenting with right lower extremity ischemia and ulcer formation with gangrenous toe now s/p left common femoral, profunda femoris, and superficial femoral artery endarterectomies, Right common femoral, profunda femoris, and superficial femoral artery endarterectomies, stent placements in bilateral common iliac arteries, Additional stent placement x 2 to the left external iliac artery, Angioplasty of the right posterior tibial artery, Angioplasty of the right popliteal artery on 11/15/22;   Pmh significant for insulin-dependent DM, HTN, CAD, PAD s/p angioplasty with stent 05/14/2021. S/P 4 & 5 toe amputation with partial ray amputation on R 11/18/22    PT Comments  Pt progressing with greater gait distance (50' RW Min guard) and decreased Assist needed for transfers (supervision level).  Pt's mobility continues to be limited by decreased activity tolerance and decreased standing balance for functional activities. He will continue to benefit from skilled PT to improve functional independence and safety.        If plan is discharge home, recommend the following: A little help with walking and/or transfers;A little help with bathing/dressing/bathroom;Assistance with cooking/housework;Assist for transportation;Help with stairs or ramp for entrance   Can travel by private vehicle        Equipment Recommendations  Rolling walker (2 wheels);BSC/3in1    Recommendations for Other Services       Precautions / Restrictions Precautions Precautions: Fall Precaution Comments: B wound vacs Restrictions Weight Bearing Restrictions: Yes RLE Weight Bearing: Partial weight bearing RLE Partial Weight Bearing Percentage or Pounds: Per note from Dr. Alberteen Spindle: Pt may be partial weightbearing in his OrthoWedge surgical shoe using crutches or  walker with pressure only on the heel of the shoe.  Minimal weightbearing for the first couple of weeks. Other Position/Activity Restrictions: WB thru heel in ortho wedge shoe     Mobility  Bed Mobility               General bed mobility comments: NT up in recliner.    Transfers Overall transfer level: Needs assistance Equipment used: Rolling walker (2 wheels) Transfers: Sit to/from Stand Sit to Stand: Supervision   Step pivot transfers: Supervision       General transfer comment: no cues required for safe technique.    Ambulation/Gait Ambulation/Gait assistance: Min guard Gait Distance (Feet): 50 Feet Assistive device: Rolling walker (2 wheels) Gait Pattern/deviations: Step-to pattern Gait velocity: decreased     General Gait Details: min guard for safety. Cues for heel WB on R. Slow gait speed, several standing rest breaks.  Cues to position R foot in more neutral position vs. externally rotated.   Stairs             Wheelchair Mobility     Tilt Bed    Modified Rankin (Stroke Patients Only)       Balance Overall balance assessment: Needs assistance Sitting-balance support: Feet supported Sitting balance-Leahy Scale: Good     Standing balance support: Bilateral upper extremity supported, During functional activity, Reliant on assistive device for balance Standing balance-Leahy Scale: Fair Standing balance comment: needing to use R foot for WB                            Cognition Arousal/Alertness: Awake/alert Behavior During Therapy: [facility] for tasks assessed/performed  General Comments: tangential in conversation throughout session but easily redirected to task        Exercises Total Joint Exercises Ankle Circles/Pumps: AROM, Strengthening, Both, 10 reps Quad Sets: AROM, Strengthening, Both, 10 reps Other Exercises Other Exercises: R straight LE hip internal rotations x5.     General Comments        Pertinent Vitals/Pain Pain Assessment Pain Assessment: No/denies pain Pain Location: Pt reported no pain during PT session.    Home Living                          Prior Function            PT Goals (current goals can now be found in the care plan section) Acute Rehab PT Goals Patient Stated Goal: return home PT Goal Formulation: With patient Time For Goal Achievement: 12/03/22 Potential to Achieve Goals: Good Progress towards PT goals: Progressing toward goals    Frequency    Min 1X/week      PT Plan Current plan remains appropriate    Co-evaluation              AM-PAC PT "6 Clicks" Mobility   Outcome Measure  Help needed turning from your back to your side while in a flat bed without using bedrails?: None Help needed moving from lying on your back to sitting on the side of a flat bed without using bedrails?: A Little Help needed moving to and from a bed to a chair (including a wheelchair)?: A Little Help needed standing up from a chair using your arms (e.g., wheelchair or bedside chair)?: A Little Help needed to walk in hospital room?: A Little Help needed climbing 3-5 steps with a railing? : Total 6 Click Score: 17    End of Session Equipment Utilized During Treatment: Gait belt Activity Tolerance: Patient tolerated treatment well Patient left: in chair;with call bell/phone within reach;with family/visitor present Nurse Communication: Mobility status PT Visit Diagnosis: Other abnormalities of gait and mobility (R26.89);Muscle weakness (generalized) (M62.81);Unsteadiness on feet (R26.81);Difficulty in walking, not elsewhere classified (R26.2)     Time: 8119-1478 PT Time Calculation (min) (ACUTE ONLY): 28 min  Charges:    $Gait Training: 8-22 mins $Therapeutic Activity: 8-22 mins PT General Charges $$ ACUTE PT VISIT: 1 Visit                    Hortencia Conradi, PTA  11/23/22, 11:52 AM

## 2022-11-24 DIAGNOSIS — I10 Essential (primary) hypertension: Secondary | ICD-10-CM | POA: Diagnosis not present

## 2022-11-24 DIAGNOSIS — E1142 Type 2 diabetes mellitus with diabetic polyneuropathy: Secondary | ICD-10-CM | POA: Diagnosis not present

## 2022-11-24 DIAGNOSIS — I2510 Atherosclerotic heart disease of native coronary artery without angina pectoris: Secondary | ICD-10-CM | POA: Diagnosis not present

## 2022-11-24 DIAGNOSIS — L97909 Non-pressure chronic ulcer of unspecified part of unspecified lower leg with unspecified severity: Secondary | ICD-10-CM | POA: Diagnosis not present

## 2022-11-24 LAB — BASIC METABOLIC PANEL WITH GFR
Anion gap: 11 (ref 5–15)
BUN: 28 mg/dL — ABNORMAL HIGH (ref 8–23)
CO2: 24 mmol/L (ref 22–32)
Calcium: 8.5 mg/dL — ABNORMAL LOW (ref 8.9–10.3)
Chloride: 100 mmol/L (ref 98–111)
Creatinine, Ser: 0.85 mg/dL (ref 0.61–1.24)
GFR, Estimated: >60 mL/min (ref >60)
Glucose, Bld: 194 mg/dL — ABNORMAL HIGH (ref 70–99)
Potassium: 3.7 mmol/L (ref 3.5–5.1)
Sodium: 135 mmol/L (ref 135–145)

## 2022-11-24 LAB — CBC
HCT: 26.1 % — ABNORMAL LOW (ref 39.0–52.0)
Hemoglobin: 8.7 g/dL — ABNORMAL LOW (ref 13.0–17.0)
MCH: 30 pg (ref 26.0–34.0)
MCHC: 33.3 g/dL (ref 30.0–36.0)
MCV: 90 fL (ref 80.0–100.0)
Platelets: 459 10*3/uL — ABNORMAL HIGH (ref 150–400)
RBC: 2.9 MIL/uL — ABNORMAL LOW (ref 4.22–5.81)
RDW: 13.7 % (ref 11.5–15.5)
WBC: 8.7 10*3/uL (ref 4.0–10.5)
nRBC: 0 % (ref 0.0–0.2)

## 2022-11-24 LAB — GLUCOSE, CAPILLARY
Glucose-Capillary: 185 mg/dL — ABNORMAL HIGH (ref 70–99)
Glucose-Capillary: 307 mg/dL — ABNORMAL HIGH (ref 70–99)
Glucose-Capillary: 133 mg/dL — ABNORMAL HIGH (ref 70–99)
Glucose-Capillary: 200 mg/dL — ABNORMAL HIGH (ref 70–99)

## 2022-11-24 MED ORDER — SPIRONOLACTONE 25 MG PO TABS
25.0000 mg | ORAL_TABLET | Freq: Every day | ORAL | Status: DC
  Administered 2022-11-24: 25 mg via ORAL
  Filled 2022-11-24: qty 1

## 2022-11-24 MED ORDER — LOSARTAN POTASSIUM 50 MG PO TABS
50.0000 mg | ORAL_TABLET | Freq: Every day | ORAL | Status: DC
  Administered 2022-11-24: 50 mg via ORAL
  Filled 2022-11-24: qty 1

## 2022-11-24 MED ORDER — SPIRONOLACTONE 25 MG PO TABS
50.0000 mg | ORAL_TABLET | Freq: Every day | ORAL | Status: DC
  Administered 2022-11-25 – 2022-11-26 (×2): 50 mg via ORAL
  Filled 2022-11-24 (×2): qty 2

## 2022-11-24 NOTE — Progress Notes (Signed)
Visited at bedside with Mr. Kutz, resting comfortably, no family at bedside. Goals clear, Chaplain services assisting with AD documentation. PMT to shadow, please feel free to reach out with acute Palliative Care needs.  No Charge.  Leeanne Deed, DNP, AGNP-C Palliative Medicine  Please call Palliative Medicine team phone with any questions [phone]. For individual providers please see AMION.

## 2022-11-24 NOTE — Progress Notes (Signed)
[facility]  Daily Progress Note   Subjective  -   No complaints but right groin Provena dressing is deflated and leaking.  Left seems OK.  NO other complaints.  Objective Vitals:   11/23/22 2111 11/24/22 0500 11/24/22 0524 11/24/22 0805  BP: 132/66  (!) 157/61 (!) 171/77  Pulse: 64  (!) 57 63  Resp: 18  18   Temp: 97.9 F (36.6 C)  98.1 F (36.7 C) 98.1 F (36.7 C)  TempSrc: Oral  Oral Oral  SpO2: 98%  93% 99%  Weight:  108.9 kg    Height:        Intake/Output Summary (Last 24 hours) at 11/24/2022 1426 Last data filed at 11/24/2022 0837 Gross per 24 hour  Intake 360 ml  Output 800 ml  Net -440 ml    PULM  CTAB CV  RRR VASC  Nicley healing staple lines in both groins with no drainage, redness or significant skin edge necrosis.  Feet warm.  Right foot 4 and 5 ray amputations are healing well with sutures in place and no drainage, redness or skin edge necrosis.  Laboratory CBC    Component Value Date/Time   WBC 8.7 11/24/2022 0640   HGB 8.7 (L) 11/24/2022 0640   HGB 15.7 02/16/2012 2013   HCT 26.1 (L) 11/24/2022 0640   HCT 44.4 02/16/2012 2013   PLT 459 (H) 11/24/2022 0640   PLT 176 02/16/2012 2013    BMET    Component Value Date/Time   NA 135 11/24/2022 0640   NA 137 02/16/2012 2013   K 3.7 11/24/2022 0640   K 3.8 02/16/2012 2013   CL 100 11/24/2022 0640   CL 102 02/16/2012 2013   CO2 24 11/24/2022 0640   CO2 25 02/16/2012 2013   GLUCOSE 194 (H) 11/24/2022 0640   GLUCOSE 154 (H) 02/16/2012 2013   BUN 28 (H) 11/24/2022 0640   BUN 10 02/16/2012 2013   CREATININE 0.85 11/24/2022 0640   CREATININE 0.72 02/16/2012 2013   CALCIUM 8.5 (L) 11/24/2022 0640   CALCIUM 9.6 02/16/2012 2013   GFRNONAA >60 11/24/2022 0640   GFRNONAA >60 02/16/2012 2013   GFRAA >60 12/09/2018 0929   GFRAA >60 02/16/2012 2013    Assessment/Planning: POD #9 s/p bilateral fem TEA and iliac stents and then POD# 7 right 4 and 5 ray amputations.  I removed his  Provena dressings.  The pump batteries are dead.  I cleansed the skin with Betadine and then applied clean, dry guaze dressings with Tegaderm dressing to attach. I unwrapped the foot at his strong request and cleaned the skin with Betadine and applied a dry guaze dressing with Kerlix and loosely reapplied the Ace bandage and replaced his protective shoe.  Everything looks to be healing well.  He is planning on discharge Tuesday.  I have never seen him before but his edema seems to be much improved by his report and is of a minimal degree now and does not seem to be limiting him in any way.  I detected no drainage from any of the three wound sites.Iline Oven  11/24/2022, 2:26 PM

## 2022-11-24 NOTE — Plan of Care (Signed)
  Problem: Education: Goal: Knowledge of General Education information will improve Description: Including pain rating scale, medication(s)/side effects and non-pharmacologic comfort measures Outcome: Progressing   Problem: Health Behavior/Discharge Planning: Goal: Ability to manage health-related needs will improve Outcome: Progressing   Problem: Clinical Measurements: Goal: Ability to maintain clinical measurements within normal limits will improve Outcome: Progressing Goal: Will remain free from infection Outcome: Progressing Goal: Diagnostic test results will improve Outcome: Progressing Goal: Respiratory complications will improve Outcome: Progressing Goal: Cardiovascular complication will be avoided Outcome: Progressing   Problem: Activity: Goal: Risk for activity intolerance will decrease Outcome: Progressing   Problem: Nutrition: Goal: Adequate nutrition will be maintained Outcome: Progressing   Problem: Coping: Goal: Level of anxiety will decrease Outcome: Progressing   Problem: Elimination: Goal: Will not experience complications related to bowel motility Outcome: Progressing Goal: Will not experience complications related to urinary retention Outcome: Progressing   Problem: Pain Managment: Goal: General experience of comfort will improve Outcome: Progressing   Problem: Safety: Goal: Ability to remain free from injury will improve Outcome: Progressing   Problem: Skin Integrity: Goal: Risk for impaired skin integrity will decrease Outcome: Progressing   Problem: Education: Goal: Ability to describe self-care measures that may prevent or decrease complications (Diabetes Survival Skills Education) will improve Outcome: Progressing Goal: Individualized Educational Video(s) Outcome: Progressing   Problem: Coping: Goal: Ability to adjust to condition or change in health will improve Outcome: Progressing   Problem: Fluid Volume: Goal: Ability to  maintain a balanced intake and output will improve Outcome: Progressing   Problem: Health Behavior/Discharge Planning: Goal: Ability to identify and utilize available resources and services will improve Outcome: Progressing Goal: Ability to manage health-related needs will improve Outcome: Progressing   Problem: Metabolic: Goal: Ability to maintain appropriate glucose levels will improve Outcome: Progressing   Problem: Nutritional: Goal: Maintenance of adequate nutrition will improve Outcome: Progressing Goal: Progress toward achieving an optimal weight will improve Outcome: Progressing   Problem: Skin Integrity: Goal: Risk for impaired skin integrity will decrease Outcome: Progressing   Problem: Tissue Perfusion: Goal: Adequacy of tissue perfusion will improve Outcome: Progressing   Problem: Education: Goal: Knowledge of prescribed regimen will improve Outcome: Progressing   Problem: Activity: Goal: Ability to tolerate increased activity will improve Outcome: Progressing   Problem: Bowel/Gastric: Goal: Gastrointestinal status for postoperative course will improve Outcome: Progressing   Problem: Clinical Measurements: Goal: Postoperative complications will be avoided or minimized Outcome: Progressing Goal: Signs and symptoms of graft occlusion will improve Outcome: Progressing   Problem: Skin Integrity: Goal: Demonstration of wound healing without infection will improve Outcome: Progressing

## 2022-11-24 NOTE — Progress Notes (Signed)
[facility] CLINIC CARDIOLOGY CONSULT NOTE       Patient ID: JQUAN EGELSTON MRN: [id number] DOB/AGE: [birth year] 79 y.o.  Admit date: 11/10/2022 Referring Physician Dr. Arnetha Courser  Primary Physician Dr. Marcelino Duster  Primary Cardiologist Dr. Tiajuana Amass  Reason for Consultation AoCHF  HPI: Edyth Gunnels. 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 Bilateral LE revascularization with Dr. Wyn Quaker was planned on 7/24 but cancelled 2/2 dyspnea and heart failure symptoms. Cardiology is consulted for assistance with the patients heart failure and medical optimization prior to vascular surgery. Echo this admission showed reduced EF 25-30% with multiple rWMAs, mild to moderate MR. Now s/p bilateral common femoral, profunda femoris, SFA endarterectomies, and bilateral common iliac artery kissing expandable stents, and left external iliac stenting x 2 performed on 7/26.  Also s/p right fourth and fifth toe amputations performed 7/29.  Interval History:  -Patient states he is feeling well this AM, remains without SOB.  -Leg swelling and scrotal swelling appear stable, continues to have good urine output.  -Hypertensive on last few BP checks, renal function improved since yesterday. Tolerating additions of losartan and spironolactone well.  Review of systems complete and found to be negative unless listed above     Past Medical History:  Diagnosis Date   Allergies    Arthritis    Diabetes mellitus without complication (HCC)    Gout    Hard of hearing    Hyperlipidemia    Hypertension    Myocardial infarction [facility]) 2008   treated here at [facility] and transferred to [employer]  for stent placement   Neuropathy    knees down   Peripheral vascular disease (HCC)    Sleep apnea    "in the past"   Status post primary angioplasty with coronary stent    Wears dentures    full upper and lower    Past Surgical History:  Procedure Laterality Date   AMPUTATION Right 11/18/2022   Procedure: AMPUTATION 4TH AND 5TH RAY;  Surgeon: Linus Galas, [name];  Location: [facility] ORS;  Service: Orthopedics/Podiatry;  Laterality: Right;  4th and 5th toe   CATARACT EXTRACTION W/PHACO Right 03/14/2021   Procedure: CATARACT EXTRACTION PHACO AND INTRAOCULAR LENS PLACEMENT (IOC) RIGHT DIABETIC;  Surgeon: Lockie Mola, MD;  Location: [facility];  Service: Ophthalmology;  Laterality: Right;  Diabetic 16.78 01:39.9   CATARACT EXTRACTION W/PHACO Left 03/28/2021   Procedure: CATARACT EXTRACTION PHACO AND INTRAOCULAR LENS PLACEMENT (IOC) LEFT DIABETIC 6.93 01:22.0;  Surgeon: Lockie Mola, MD;  Location: [facility];  Service: Ophthalmology;  Laterality: Left;  Diabetic   ENDARTERECTOMY FEMORAL Bilateral 11/15/2022   Procedure: BILATERAL COMMON FEMORAL PROFUNDA FEMORIS AND SUPERFICIAL FEMORAL ARTERY ENDARTECTOMIES, RIGHT FOGARTY EMBOLECTOMY OF THE RIGHT SFA  AND  POPLITEAL ARTERIES. AORTAGRAM AND RIGHT LOWER EXTREMITY ANGIOGRAM.;  Surgeon: Annice Needy, MD;  Location: [facility] ORS;  Service: Vascular;  Laterality: Bilateral;   INSERTION OF ILIAC STENT Bilateral 11/15/2022   Procedure: BILATERAL STENT INSERTION IN BILATERAL  COMMON ILIAC ARTERY, STENT INSERTION OF LEFT EXTERNAL ILIAC ARTERY. ANGIOPLASTY RIGHT TIBIAL  AND POPLITEAL ARTERY.;  Surgeon: Annice Needy, MD;  Location: [facility] ORS;  Service: Vascular;  Laterality: Bilateral;  LOWER EXTREMITY ANGIOGRAPHY Right 05/14/2021   Procedure: LOWER EXTREMITY ANGIOGRAPHY;  Surgeon: Annice Needy, MD;  Location: [facility] INVASIVE CV LAB;  Service: Cardiovascular;  Laterality: Right;   LOWER EXTREMITY ANGIOGRAPHY Right 06/14/2021   Procedure:  Lower Extremity Angiography;  Surgeon: Annice Needy, MD;  Location: [facility] INVASIVE CV LAB;  Service: Cardiovascular;  Laterality: Right;   LOWER EXTREMITY ANGIOGRAPHY Right 11/11/2022   Procedure: Lower Extremity Angiography;  Surgeon: Annice Needy, MD;  Location: [facility] INVASIVE CV LAB;  Service: Cardiovascular;  Laterality: Right;   LOWER EXTREMITY INTERVENTION Right 06/15/2021   Procedure: LOWER EXTREMITY INTERVENTION;  Surgeon: Annice Needy, MD;  Location: [facility] INVASIVE CV LAB;  Service: Cardiovascular;  Laterality: Right;   TONSILLECTOMY     had removed as a child    Medications Prior to Admission  Medication Sig Dispense Refill Last Dose   amLODipine (NORVASC) 10 MG tablet Take 10 mg by mouth daily.   Past Week   carvedilol (COREG) 12.5 MG tablet Take 1 tablet by mouth 2 (two) times daily.   Past Week   clopidogrel (PLAVIX) 75 MG tablet Take 1 tablet (75 mg total) by mouth daily. 90 tablet 1 Past Week   gabapentin (NEURONTIN) 400 MG capsule Take 800 mg by mouth 2 (two) times daily.   Past Week   insulin aspart protamine- aspart (NOVOLOG MIX 70/30) (70-30) 100 UNIT/ML injection Inject 32 Units into the skin 2 (two) times daily with a meal.   Past Week   lisinopril (ZESTRIL) 20 MG tablet Take 1 tablet (20 mg total) by mouth daily. 30 tablet 1 Past Week   apixaban (ELIQUIS) 5 MG TABS tablet Take 1 tablet (5 mg total) by mouth 2 (two) times daily. (Patient not taking: Reported on 01/25/2022) 60 tablet 1 Not Taking   aspirin EC 81 MG tablet Take 1 tablet (81 mg total) by mouth daily. (Patient not taking: Reported on 08/13/2022) 150 tablet 2 Not Taking   atorvastatin (LIPITOR) 80 MG tablet Take 1 tablet (80 mg total) by mouth daily. (Patient not taking: Reported on 11/11/2022) 30 tablet 1 Not Taking   cefdinir (OMNICEF) 300 MG capsule Take 300 mg by mouth 2 (two) times daily. (Patient not taking: Reported on 11/11/2022)   Not Taking   DULoxetine (CYMBALTA) 30 MG capsule Take 30 mg by mouth daily. (Patient  not taking: Reported on 07/11/2021)      fluticasone (FLONASE) 50 MCG/ACT nasal spray Place 2 sprays into both nostrils as needed for allergies or rhinitis. (Patient not taking: Reported on 07/11/2021)      gabapentin (NEURONTIN) 100 MG capsule Take 100 mg by mouth at bedtime. (Patient not taking: Reported on 11/11/2022)   Not Taking   Ibuprofen 200 MG CAPS Take 2 tablets by mouth 3 times/day as needed-between meals & bedtime. (Patient not taking: Reported on 11/11/2022)   Not Taking   levocetirizine (XYZAL) 5 MG tablet Take 5 mg by mouth at bedtime. (Patient not taking: Reported on 08/13/2022)   Not Taking   polyethylene glycol (MIRALAX / GLYCOLAX) 17 g packet Take 17 g by mouth daily. (Patient not taking: Reported on 08/13/2022) 14 each 0    senna-docusate (SENOKOT-S) 8.6-50 MG tablet Take 1 tablet by mouth at bedtime as needed for mild constipation. 30 tablet 0 prn   zolpidem (AMBIEN) 5 MG tablet Take 1 tablet (5 mg total) by mouth at bedtime as needed for up to 5 doses for sleep. (Patient not taking: Reported on 07/11/2021) 5 tablet  0 Not Taking   Social History   Socioeconomic History   Marital status: Married    Spouse name: Not on file   Number of children: Not on file   Years of education: Not on file   Highest education level: Not on file  Occupational History   Not on file  Tobacco Use   Smoking status: Never   Smokeless tobacco: Never  Vaping Use   Vaping status: Never Used  Substance and Sexual Activity   Alcohol use: Yes    Alcohol/week: 21.0 standard drinks of alcohol    Types: 7 Glasses of wine, 14 Cans of beer per week    Comment: occassional   Drug use: No   Sexual activity: Not on file  Other Topics Concern   Not on file  Social History Narrative   Not on file   Social Determinants of Health   Financial Resource Strain: Not on file  Food Insecurity: No Food Insecurity (11/10/2022)   Hunger Vital Sign    Worried About Running Out of Food in the Last Year: Never true     Ran Out of Food in the Last Year: Never true  Transportation Needs: No Transportation Needs (11/10/2022)   PRAPARE - Administrator, Civil Service (Medical): No    Lack of Transportation (Non-Medical): No  Physical Activity: Not on file  Stress: Not on file  Social Connections: Not on file  Intimate Partner Violence: Not At Risk (11/10/2022)   Humiliation, Afraid, Rape, and Kick questionnaire    Fear of Current or Ex-Partner: No    Emotionally Abused: No    Physically Abused: No    Sexually Abused: No    Family History  Problem Relation Age of Onset   Scoliosis Mother    Heart disease Father       Intake/Output Summary (Last 24 hours) at 11/24/2022 0920 Last data filed at 11/24/2022 7829 Gross per 24 hour  Intake 360 ml  Output 1100 ml  Net -740 ml    Vitals:   11/23/22 2111 11/24/22 0500 11/24/22 0524 11/24/22 0805  BP: 132/66  (!) 157/61 (!) 171/77  Pulse: 64  (!) 57 63  Resp: 18  18   Temp: 97.9 F (36.6 C)  98.1 F (36.7 C) 98.1 F (36.7 C)  TempSrc: Oral  Oral Oral  SpO2: 98%  93% 99%  Weight:  108.9 kg    Height:        PHYSICAL EXAM General: Pleasant conversational elderly male, well nourished, in no acute distress. Laying flat in hospital bed. HEENT:  Normocephalic and atraumatic. Neck:  No JVD.  Lungs: Normal respiratory effort on room air trace bibasilar crackles without appreciable wheezes.   Heart: HRRR . Normal S1 and S2 without gallops or murmurs.  Abdomen: Non-distended appearing adiposity.  Msk: Normal strength and tone for age. Extremities: 1-2+ bilateral lower extremity edema. surgical shoe and dressing applied to right lower extremity.   Neuro: Alert and oriented X 3. Psych:  Answers questions appropriately.   Labs: Basic Metabolic Panel: Recent Labs    11/23/22 1053 11/24/22 0640  NA 134* 135  K 4.1 3.7  CL 100 100  CO2 24 24  GLUCOSE 212* 194*  BUN 39* 28*  CREATININE 1.18 0.85  CALCIUM 8.7* 8.5*  MG 2.3  --     Liver Function Tests: No results for input(s): "AST", "ALT", "ALKPHOS", "BILITOT", "PROT", "ALBUMIN" in the last 72 hours. No results for input(s): "LIPASE", "AMYLASE" in  the last 72 hours. CBC: Recent Labs    11/23/22 1053 11/24/22 0640  WBC 9.1 8.7  HGB 8.9* 8.7*  HCT 26.7* 26.1*  MCV 89.6 90.0  PLT 458* 459*   Cardiac Enzymes: No results for input(s): "CKTOTAL", "CKMB", "CKMBINDEX", "TROPONINIHS" in the last 72 hours. BNP: No results for input(s): "BNP" in the last 72 hours.  D-Dimer: No results for input(s): "DDIMER" in the last 72 hours. Hemoglobin A1C: No results for input(s): "HGBA1C" in the last 72 hours. Fasting Lipid Panel: No results for input(s): "CHOL", "HDL", "LDLCALC", "TRIG", "CHOLHDL", "LDLDIRECT" in the last 72 hours. Thyroid Function Tests: No results for input(s): "TSH", "T4TOTAL", "T3FREE", "THYROIDAB" in the last 72 hours.  Invalid input(s): "FREET3" Anemia Panel: No results for input(s): "VITAMINB12", "FOLATE", "FERRITIN", "TIBC", "IRON", "RETICCTPCT" in the last 72 hours.   Radiology: US SCROTUM W/DOPPLER  Result Date: 11/21/2022 CLINICAL DATA:  86310 Swelling [id number] EXAM: SCROTAL ULTRASOUND DOPPLER ULTRASOUND OF THE TESTICLES TECHNIQUE: Complete ultrasound examination of the testicles, epididymis, and other scrotal structures was performed. Color and spectral Doppler ultrasound were also utilized to evaluate blood flow to the testicles. COMPARISON:  None Available. FINDINGS: Right testicle Measurements: 4.1 x 3.1 x 3.0 cm. No mass or microlithiasis visualized. Left testicle Measurements: 4.2 x 3.0 x 3.0 cm. No mass or microlithiasis visualized. Right epididymis: Mildly heterogeneous and expanded in appearance measuring 1.0 x 1.4 x 1.6 cm. Left epididymis: Mildly heterogeneous and expanded appearance measuring 1.0 x 1.2 x 1.9 cm. Hydrocele:  Trace peritesticular fluid. Varicocele:  None visualized. Pulsed Doppler interrogation of both testes demonstrates  normal low resistance arterial and venous waveforms bilaterally. Other: Soft tissue edema and dermal thickening. On several of the cine images, there is possible echogenic areas with posterior dirty shadowing in the posterior scrotal soft tissues (for example series 10 image 40) IMPRESSION: 1. There is diffuse scrotal edema and dermal thickening. There are possible echogenic areas with posterior dirty shadowing in the posterior scrotal soft tissues suggestive of air. This is nonspecific and could reflect artifact or trapped air but subcutaneous air is in the differential. Recommend correlation with physical exam with consideration of dedicated CT low pelvis to evaluate for intradermal air if there is concern for Fournier's gangrene. 2. The epididymides are mildly heterogeneous and expanded in appearance. This is nonspecific and likely due to chronic inflammation or sequela of prior infection. 3. No sonographic evidence of testicular torsion. These results will be called to the ordering clinician or representative by the Radiologist Assistant, and communication documented in the PACS or [employer]. Electronically Signed   By: Meda Klinefelter M.D.   On: 11/21/2022 14:11   DG C-Arm 1-60 Min-No Report  Result Date: 11/15/2022 Fluoroscopy was utilized by the requesting physician.  No radiographic interpretation.   ECHOCARDIOGRAM COMPLETE  Result Date: 11/14/2022    ECHOCARDIOGRAM REPORT   Patient Name:   JUSTN QUALE Date of Exam: 11/14/2022 Medical Rec #:  [id number]        Height:       68.0 in Accession #:    [id number]       Weight:       191.6 lb Date of Birth:  09-Dec-1945        BSA:          2.007 m Patient Age:    77 years         BP:           151/83 mmHg Patient Gender: M  HR:           88 bpm. Exam Location:  [facility] Procedure: 2D Echo, Cardiac Doppler, Color Doppler and Strain Analysis Indications:     Dyspnea R06.00  History:         Patient has no prior history of  Echocardiogram examinations.                  Previous Myocardial Infarction; Risk Factors:Hypertension and                  Diabetes.  Sonographer:     Cristela Blue Referring Phys:  [id number] Tonna Corner MICHELLE TANG Diagnosing Phys: Marcina Millard MD  Sonographer Comments: Global longitudinal strain was attempted. IMPRESSIONS  1. Left ventricular ejection fraction, by estimation, is 25 to 30%. The left ventricle has severely decreased function. The left ventricle demonstrates regional wall motion abnormalities (see scoring diagram/findings for description). Left ventricular diastolic parameters were normal.  2. Right ventricular systolic function is normal. The right ventricular size is normal.  3. The mitral valve is normal in structure. Mild to moderate mitral valve regurgitation. No evidence of mitral stenosis.  4. Tricuspid valve regurgitation is mild to moderate.  5. The aortic valve is normal in structure. Aortic valve regurgitation is not visualized. No aortic stenosis is present.  6. The inferior vena cava is normal in size with greater than 50% respiratory variability, suggesting right atrial pressure of 3 mmHg. FINDINGS  Left Ventricle: Left ventricular ejection fraction, by estimation, is 25 to 30%. The left ventricle has severely decreased function. The left ventricle demonstrates regional wall motion abnormalities. The left ventricular internal cavity size was normal  in size. There is no left ventricular hypertrophy. Left ventricular diastolic parameters were normal.  LV Wall Scoring: The basal anteroseptal segment, apical lateral segment, apical septal segment, and apex are akinetic. The mid anterolateral segment is hypokinetic. Right Ventricle: The right ventricular size is normal. No increase in right ventricular wall thickness. Right ventricular systolic function is normal. Left Atrium: Left atrial size was normal in size. Right Atrium: Right atrial size was normal in size. Pericardium: There is no  evidence of pericardial effusion. Mitral Valve: The mitral valve is normal in structure. Mild to moderate mitral valve regurgitation. No evidence of mitral valve stenosis. Tricuspid Valve: The tricuspid valve is normal in structure. Tricuspid valve regurgitation is mild to moderate. No evidence of tricuspid stenosis. Aortic Valve: The aortic valve is normal in structure. Aortic valve regurgitation is not visualized. No aortic stenosis is present. Aortic valve mean gradient measures 2.0 mmHg. Aortic valve peak gradient measures 4.2 mmHg. Aortic valve area, by VTI measures 2.55 cm. Pulmonic Valve: The pulmonic valve was normal in structure. Pulmonic valve regurgitation is not visualized. No evidence of pulmonic stenosis. Aorta: The aortic root is normal in size and structure. Venous: The inferior vena cava is normal in size with greater than 50% respiratory variability, suggesting right atrial pressure of 3 mmHg. IAS/Shunts: No atrial level shunt detected by color flow Doppler.  LEFT VENTRICLE PLAX 2D LVIDd:         4.60 cm      Diastology LVIDs:         4.00 cm      LV e' medial:    3.70 cm/s LV PW:         1.00 cm      LV E/e' medial:  30.3 LV IVS:        1.30 cm  LV e' lateral:   6.09 cm/s LVOT diam:     2.00 cm      LV E/e' lateral: 18.4 LV SV:         44 LV SV Index:   22 LVOT Area:     3.14 cm  LV Volumes (MOD) LV vol d, MOD A2C: 156.0 ml LV vol d, MOD A4C: 191.0 ml LV vol s, MOD A2C: 119.0 ml LV vol s, MOD A4C: 134.0 ml LV SV MOD A2C:     37.0 ml LV SV MOD A4C:     191.0 ml LV SV MOD BP:      41.9 ml RIGHT VENTRICLE RV Basal diam:  4.40 cm RV Mid diam:    3.50 cm LEFT ATRIUM             Index        RIGHT ATRIUM           Index LA diam:        3.90 cm 1.94 cm/m   RA Area:     12.60 cm LA Vol (A2C):   68.0 ml 33.89 ml/m  RA Volume:   28.00 ml  13.95 ml/m LA Vol (A4C):   57.5 ml 28.65 ml/m LA Biplane Vol: 63.6 ml 31.69 ml/m  AORTIC VALVE AV Area (Vmax):    2.43 cm AV Area (Vmean):   2.51 cm AV Area  (VTI):     2.55 cm AV Vmax:           102.00 cm/s AV Vmean:          67.500 cm/s AV VTI:            0.174 m AV Peak Grad:      4.2 mmHg AV Mean Grad:      2.0 mmHg LVOT Vmax:         79.00 cm/s LVOT Vmean:        53.900 cm/s LVOT VTI:          0.141 m LVOT/AV VTI ratio: 0.81  AORTA Ao Root diam: 3.50 cm MITRAL VALVE MV Area (PHT): 5.02 cm     SHUNTS MV Decel Time: 151 msec     Systemic VTI:  0.14 m MV E velocity: 112.00 cm/s  Systemic Diam: 2.00 cm MV A velocity: 79.10 cm/s MV E/A ratio:  1.42 Marcina Millard MD Electronically signed by Marcina Millard MD Signature Date/Time: 11/14/2022/1:14:40 PM    Final    DG Chest Port 1 View  Result Date: 11/12/2022 CLINICAL DATA:  Shortness of breath EXAM: PORTABLE CHEST 1 VIEW COMPARISON:  Prior chest x-ray 11/09/2021 FINDINGS: Diffuse irregular interstitial prominence with areas of nodularity throughout the lungs. Small bilateral pleural effusions and associated bibasilar atelectasis. Cardiac and mediastinal contours are within normal limits. Atherosclerotic calcifications are visible in the transverse aorta. No pneumothorax. No acute osseous abnormality. Bilateral glenohumeral joint osteoarthritis. IMPRESSION: Diffuse interstitial prominence with areas of nodularity throughout both lungs. Differential considerations include an acute atypical infectious/inflammatory process such as viral pneumonia versus atypical pulmonary edema. Trace bilateral pleural effusions and associated bibasilar atelectasis. Normal heart size. Electronically Signed   By: Malachy Moan M.D.   On: 11/12/2022 13:39   PERIPHERAL VASCULAR CATHETERIZATION  Result Date: 11/11/2022 See surgical note for result.  DG Foot Complete Right  Result Date: 11/10/2022 CLINICAL DATA:  Toe ulcers.  Evaluate for osteomyelitis. EXAM: RIGHT FOOT COMPLETE - 3+ VIEW COMPARISON:  None Available. FINDINGS: There are no signs of acute fracture or  dislocation. No focal bone erosions of osteomyelitis  identified. Mild hallux valgus deformity with mild degenerative changes at the first MTP joint. Mild dorsal soft tissue edema. IMPRESSION: 1. No acute findings. No signs of osteomyelitis. 2. 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 Electronically Signed   By: Signa Kell M.D.   On: 11/10/2022 10:00    ECHO 11/10/2018 MILD LV SYSTOLIC DYSFUNCTION WITH AN ESTIMATED EF = 40-45 %  NORMAL RIGHT VENTRICULAR SYSTOLIC FUNCTION  MILD-TO-MODERATE MITRAL VALVE INSUFFICIENCY  TRACE TRICUSPID VALVE INSUFFICIENCY  NO VALVULAR STENOSIS  INFERIOR WALL HYPOKINESIS  _________________________________________________________________________________________  Electronically signed by      MD Arnoldo Hooker on 11/10/2018 10: 43 AM           Performed By: Luretha Murphy, RDCS, RVT     Ordering Physician: [name], MD Velna Ochs myoview 11/10/2018 Normal Lexiscan infusion EKG  Mild segmental LV systolic dysfunction with inferior hypokinesis  Fixed and minimal reversible inferior myocardial perfusion defect  consistent with mainly previous infarct with peri-infarct myocardial  ischemia  Clinical correlation advised    TELEMETRY reviewed by me [facility]) 11/24/2022: Not on telemetry  EKG reviewed by me: NSR 70, nonspec IVCD, hx old inferior infarct  Data reviewed by me [facility]) 11/24/2022: hospitalist progress notes, PT/OT notes, palliative care note, last 24h vitals tele labs imaging I/O    Principal Problem:   Ischemic ulcer of lower extremity (HCC) Active Problems:   Hypertension   Coronary artery disease with history of myocardial infarction without history of CABG   Diabetes mellitus type 2, insulin dependent (HCC)   Diabetic polyneuropathy associated with type 2 diabetes mellitus (HCC)   Mixed hyperlipidemia   Alcohol abuse   Limb ischemia   Acute on chronic combined systolic and diastolic CHF (congestive heart failure) (HCC)   Adjustment disorder with depressed mood     ASSESSMENT AND PLAN:  Manuel [name] is a 1yoM with a PMH of CAD s/p PCI mRCA (3 overlapping Xience stents 2011), extensive PAD s/p PTA L common Iliac, R peroneal, R distal SFA/prox popliteal, and stent to R distal SFA/prox popliteal arteries (05/14/21), 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 Bilateral LE revascularization with Dr. Wyn Quaker was planned on 7/24 but cancelled 2/2 dyspnea and heart failure symptoms. Cardiology is consulted for assistance with the patients heart failure and medical optimization prior to vascular surgery. Echo this admission showed reduced EF 25-30% with multiple rWMAs, mild to moderate MR. Now s/p bilateral common femoral, profunda femoris, SFA endarterectomies, and bilateral common iliac artery kissing expandable stents, and left external iliac stenting x 2 performed on 7/26.  Also s/p right fourth and fifth toe amputations performed 7/29.  # Severe PAD with lower extremity ulceration -Agree with current therapy per primary, vascular surgery, and podiatry -Unasyn>>Augmentin  # Acute on chronic HFrEF # Scrotal edema Developed dyspnea/orthopnea on 7/23 and was given IV Lasix with improvement, vascular surgery intervention canceled for 7/24 due to his heart failure exacerbation. Repeat chest x-ray with trace bilateral pleural effusions, BNP was elevated in the 900s.  S/p IV Lasix  BID for multiple days with clinical improvement. D/t increased Cr, primary team discontinued diuretics from 7/28-7/30.  Continued hypervolemia on exam with scrotal edema, and 1-2+ pitting edema bilaterally today. -Renal function improved with creatinine of 0.85 this morning (1.18 yesterday). Weight down this AM but unsure of accuracy. -BP elevated  this AM, will increase doses of losartan and spironolactone.  Monitor BP and renal function closely.  Continue carvedilol 12.5 mg twice  daily. -Continue IV Lasix 60 mg twice daily, plan to reassess tomorrow. Continues to have good UOP. Plan to discharge on a daily loop diuretic (20 mg torsemide daily) -Stressed the importance of complete cessation from alcohol and limiting salt intake -Scrotal U/S with diffuse scrotal edema.  Urology consult 8/2 who recommended continued diuresis and scrotal elevation. -Patient previously taking amlodipine at home, this has been discontinued. -Patient was educated on worsening heart function and heart failure in general, he seems somewhat reluctant to follow-up in clinic and have the burden of seeing multiple specialists to "have his life revolve around seeing a different doctor each day."  We will schedule him an appointment to follow-up with Dr. Lorelle Gibbs 1-2 weeks following hospital discharge.   This patient's plan of care was discussed and created with Dr. Darrold Junker and he is in agreement.  Signed: Gale Journey , PA-C 11/24/2022, 9:20 AM [facility]

## 2022-11-24 NOTE — Progress Notes (Signed)
Progress Note   Patient: Manuel [name]:[id number] DOB: 06/03/1945 DOA: 11/10/2022     14 DOS: the patient was seen and examined on 11/24/2022   Brief hospital course: Taken from H&P.   Manuel [name] is a 77 y.o. male with medical history significant of  insulin-dependent DM, HTN, CAD, PAD s/p angioplasty with stent 05/14/2021 presenting with right lower extremity ischemia and ulcer formation with gangrenous toe.  Presented to the ER afebrile, hemodynamically stable. White count 11.2, hemoglobin 15.2, platelets 303, lactate 1.2, creatinine 0.9. T. bili 1.5. Plain films of the right foot with no signs of osteomyelitis. Manuel [name] with vascular surgery initially consulted with recommendation for heparin drip. Manuel [name] with podiatry also consulted for further evaluation.   7/22: Vital stable.  Going for lower extremity angiography and possible angioplasty with vascular today.  CRP elevated at 10.1, decreased prealbumin at 15 and normal ESR.  Preliminary blood cultures negative.  Continue current broad-spectrum antibiotics.  Found to have very extensive disease and most likely will required bilateral femoral endarterectomies and bilateral iliac stents.  Vascular is looking for a OR time for this extensive procedure which will require 4 to 5 hours.  7/23: Vital and labs stable.  Overnight some shortness of breath and crackles.  One-time 40 mg of IV Lasix was given.  Chest x-ray ordered but patient refused. Patient had a choking spell on meds this morning, continued to feel something sticking in his chest.  No hypoxia.  Chest x-ray with diffuse interstitial prominence and areas of nodularity throughout the both lungs.  Differential include atypical inflammatory process versus atypical pulmonary edema.  Ordered 1 dose of IV Lasix Going for his extensive vascular procedure tomorrow.  7/24: Mildly elevated blood pressure.  BNP elevated at 932, surgery got canceled for concern of acute on chronic  heart failure and patient now need cardiology clearance before proceeding. Family concern of suicidal thoughts, psych was consulted.  7/25: Hemodynamically stable.  Will echocardiogram with EF of 25 to 30% with some regional wall motion abnormalities.  Likely weak going for vascular surgery tomorrow, pending final recommendations from cardiology. Psychiatry with no suicidal thoughts.  7/26: Hemodynamically stable.  Going for vascular procedure today.  7/27: Vital stable.  Patient underwent extensive vascular procedure which involved bilateral lower extremity endarterectomies and multiple stents placement.  Pulses present with Doppler this morning.  Significant blood loss during the procedure, hemoglobin decreased to 11.5 this morning, baseline of 13.  Started on supplement.  7/28: Vital stable.  Labs with increase in creatinine to 1.31-holding Lasix today.  Hemoglobin further decreased to 9.4.  Likely be going for toe amputation tomorrow.  Arterial line removed.  7/29: Going for toes removal today.  Slight worsening of renal function today, holding diuretic and giving some IV fluid as patient is n.p.o.  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  Hemoglobin decreased to 7.8 postsurgically. Patient will be going home with 2 wound VAC in place in his groin areas from vascular surgery and they will remove it in the clinic on 11/25/2022.  Repeat PT and OT evaluation pending but he would like to go home with home health.  If remains stable likely can be discharged tomorrow. Heparin infusion is being discontinued and patient will be started on aspirin and Plavix per vascular surgery.  7/31: Hemodynamically stable.  Physical therapy recommendation remained for home health, which was ordered.  Unasyn can be switched to Augmentin on discharge for 1 more week per podiatry.  Appears volume up again today, worsening renal function and lower extremity edema, remained on room air.  Restarting IV Lasix.   Holding discharge until volume status improves.  8/1: Still having shortness of breath, orthopnea and scrotal swelling. 8/2: Urology consult per patient request.  Discontinued narcotics and other vitamins per patient request.  Had good bowel movement 8/3: DNR now. 8/4: Increased spironolactone.  Changed Praveena dressing by vascular surgery  Assessment and Plan: * Ischemic ulcer of lower extremity (HCC) Worsening right lower extremity redness swelling and pain over the past 2 weeks in the setting of baseline limb ischemia status post angioplasty and stenting January 2023.Noted worsening ulcer formation across multiple digits of the foot with concern for gangrene Patient reports compliance with Plavix and statin therapy Started on heparin drip in the ER Vascular procedure with extensive peripheral vascular disease, second procedure for bilateral endarterectomies and bilateral iliac stent placement with vascular surgery was done -Stop Augmentin at discharge -S/p fourth and fifth toe amputation on 7/29. -Heparin was discontinued on 7/30 and he was started on aspirin and Plavix per vascular surgery He has bilateral Provena wound vacs that need to be removed 10 days post op. on 11/25/22. They just get removed and thrown in the trash (he entire unit) per vascular surgery.  There was some leakage from Provena so vascular surgery removed Prevena dressings.  Dressing changed.  Scrotal swelling Due to fluid overload.  Seen by urology recommend scrotal elevation and diuresis Scrotal ultrasound not showing any acute pathology.    Acute on chronic combined systolic and diastolic CHF (congestive heart failure) (HCC) According to a prior echo done in 2020, EF of 40 to 45%. Patient with concern of shortness of breath, mild hypoxia and chest x-ray with bilateral opacities.  BNP elevated at 932. Repeat echocardiogram with further decrease of EF to 20 to 25% with regional wall motion abnormalities. -Continues  to have scrotal swelling.  Scrotal ultrasound performed and is within normal limit. -Continue Lasix to 60 mg IV twice daily per cardiology.  Increase spironolactone to 50 mg daily -Daily weight and BMP -Strict intake and output Net IO Since Admission: 651.89 mL [11/24/22 1443]   Diabetic polyneuropathy associated with type 2 diabetes mellitus (HCC) Cont neurontin   Coronary artery disease with history of myocardial infarction without history of CABG Followed by Manuel [name] outpt  CAD s/p PCI to [facility] with overlapping 3 mm Xience stents of 23 mm and 12 mm length in 2011  No active chest pain Continue aspirin and Plavix  Hypertension Patient required nitro GGT after the procedure which is off now Continue Coreg, Lasix  Diabetes mellitus type 2, insulin dependent (HCC) SSI  Alcohol abuse 3-4 beer daily drinker + mixed drinks  off CIWA protocol  Follow   Mixed hyperlipidemia Continue statin  Adjustment disorder with depressed mood Family was concerned about some suicidal thoughts so psychiatry was consulted. They cleared him as patient denies any suicidal thoughts, stating that he was just frustrated  Constipation Now resolved He is in agreement to stop all his narcotics as he feels that had been contributing to his constipation   Subjective: He wants his Provena dressing to be reevaluated as it is leaking.  Hopeful to go home soon.  Overall feeling better.  Still has scrotal swelling  Physical Exam: Vitals:   11/23/22 2111 11/24/22 0500 11/24/22 0524 11/24/22 0805  BP: 132/66  (!) 157/61 (!) 171/77  Pulse: 64  (!) 57 63  Resp: 18  18  Temp: 97.9 F (36.6 C)  98.1 F (36.7 C) 98.1 F (36.7 C)  TempSrc: Oral  Oral Oral  SpO2: 98%  93% 99%  Weight:  108.9 kg    Height:       General.  Frail elderly man, in no acute distress. Pulmonary.  Lungs clear bilaterally, normal respiratory effort. CV.  Regular rate and rhythm, no JVD, rub or murmur. Abdomen.  Soft,  distended, hypoactive bowel sounds CNS.  Alert and oriented .  No focal neurologic deficit. Extremities. 1-2+ LE edema, no cyanosis, palpable pulses on left, Provena in place, has scrotal swelling Psychiatry.  Judgment and insight appears normal.    Data Reviewed: Prior data reviewed  Family Communication: Son updated at bedside Disposition: Status is: Inpatient Remains inpatient appropriate because: Severity of illness  Planned Discharge Destination:  Home   Time spent: 35 minutes  This record has been created using Conservation officer, historic buildings. Errors have been sought and corrected,but may not always be located. Such creation errors do not reflect on the standard of care.   Author: Delfino Lovett, MD 11/24/2022 2:43 PM  For on call review [email or web address].

## 2022-11-25 DIAGNOSIS — L97909 Non-pressure chronic ulcer of unspecified part of unspecified lower leg with unspecified severity: Secondary | ICD-10-CM | POA: Diagnosis not present

## 2022-11-25 DIAGNOSIS — E1142 Type 2 diabetes mellitus with diabetic polyneuropathy: Secondary | ICD-10-CM | POA: Diagnosis not present

## 2022-11-25 DIAGNOSIS — I10 Essential (primary) hypertension: Secondary | ICD-10-CM | POA: Diagnosis not present

## 2022-11-25 DIAGNOSIS — I2510 Atherosclerotic heart disease of native coronary artery without angina pectoris: Secondary | ICD-10-CM | POA: Diagnosis not present

## 2022-11-25 LAB — GLUCOSE, CAPILLARY
Glucose-Capillary: 206 mg/dL — ABNORMAL HIGH (ref 70–99)
Glucose-Capillary: 203 mg/dL — ABNORMAL HIGH (ref 70–99)
Glucose-Capillary: 226 mg/dL — ABNORMAL HIGH (ref 70–99)
Glucose-Capillary: 194 mg/dL — ABNORMAL HIGH (ref 70–99)

## 2022-11-25 MED ORDER — METOLAZONE 5 MG PO TABS
5.0000 mg | ORAL_TABLET | Freq: Once | ORAL | Status: AC
  Administered 2022-11-25: 5 mg via ORAL
  Filled 2022-11-25: qty 1

## 2022-11-25 MED ORDER — INSULIN ASPART 100 UNIT/ML IJ SOLN
3.0000 [IU] | Freq: Three times a day (TID) | INTRAMUSCULAR | Status: DC
  Administered 2022-11-25 – 2022-11-26 (×3): 3 [IU] via SUBCUTANEOUS
  Filled 2022-11-25 (×3): qty 1

## 2022-11-25 MED ORDER — LOSARTAN POTASSIUM 50 MG PO TABS
75.0000 mg | ORAL_TABLET | Freq: Every day | ORAL | Status: DC
  Administered 2022-11-25 – 2022-11-26 (×2): 75 mg via ORAL
  Filled 2022-11-25 (×2): qty 1

## 2022-11-25 NOTE — Care Management Important Message (Signed)
Important Message  Patient Details  Name: Manuel [name] MRN: [id number] Date of Birth: [birth year]   Medicare Important Message Given:  Yes     Johnell Comings 11/25/2022, 10:39 AM

## 2022-11-25 NOTE — Progress Notes (Signed)
Progress Note   Patient: Manuel [name]:[id number] DOB: 03-20-1946 DOA: 11/10/2022     15 DOS: the patient was seen and examined on 11/25/2022   Brief hospital course: Taken from H&P.   [name] is a 77 y.o. male with medical history significant of  insulin-dependent DM, HTN, CAD, PAD s/p angioplasty with stent 05/14/2021 presenting with right lower extremity ischemia and ulcer formation with gangrenous toe.  Presented to the ER afebrile, hemodynamically stable. White count 11.2, hemoglobin 15.2, platelets 303, lactate 1.2, creatinine 0.9. T. bili 1.5. Plain films of the right foot with no signs of osteomyelitis. Dr. [name] with vascular surgery initially consulted with recommendation for heparin drip. Dr. [name] with podiatry also consulted for further evaluation.   7/22: Vital stable.  Going for lower extremity angiography and possible angioplasty with vascular today.  CRP elevated at 10.1, decreased prealbumin at 15 and normal ESR.  Preliminary blood cultures negative.  Continue current broad-spectrum antibiotics.  Found to have very extensive disease and most likely will required bilateral femoral endarterectomies and bilateral iliac stents.  Vascular is looking for a OR time for this extensive procedure which will require 4 to 5 hours.  7/23: Vital and labs stable.  Overnight some shortness of breath and crackles.  One-time 40 mg of IV Lasix was given.  Chest x-ray ordered but patient refused. Patient had a choking spell on meds this morning, continued to feel something sticking in his chest.  No hypoxia.  Chest x-ray with diffuse interstitial prominence and areas of nodularity throughout the both lungs.  Differential include atypical inflammatory process versus atypical pulmonary edema.  Ordered 1 dose of IV Lasix Going for his extensive vascular procedure tomorrow.  7/24: Mildly elevated blood pressure.  BNP elevated at 932, surgery got canceled for concern of acute on chronic  heart failure and patient now need cardiology clearance before proceeding. Family concern of suicidal thoughts, psych was consulted.  7/25: Hemodynamically stable.  Will echocardiogram with EF of 25 to 30% with some regional wall motion abnormalities.  Likely weak going for vascular surgery tomorrow, pending final recommendations from cardiology. Psychiatry with no suicidal thoughts.  7/26: Hemodynamically stable.  Going for vascular procedure today.  7/27: Vital stable.  Patient underwent extensive vascular procedure which involved bilateral lower extremity endarterectomies and multiple stents placement.  Pulses present with Doppler this morning.  Significant blood loss during the procedure, hemoglobin decreased to 11.5 this morning, baseline of 13.  Started on supplement.  7/28: Vital stable.  Labs with increase in creatinine to 1.31-holding Lasix today.  Hemoglobin further decreased to 9.4.  Likely be going for toe amputation tomorrow.  Arterial line removed.  7/29: Going for toes removal today.  Slight worsening of renal function today, holding diuretic and giving some IV fluid as patient is n.p.o.  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  Hemoglobin decreased to 7.8 postsurgically. Patient will be going home with 2 wound VAC in place in his groin areas from vascular surgery and they will remove it in the clinic on 11/25/2022.  Repeat PT and OT evaluation pending but he would like to go home with home health.  If remains stable likely can be discharged tomorrow. Heparin infusion is being discontinued and patient will be started on aspirin and Plavix per vascular surgery.  7/31: Hemodynamically stable.  Physical therapy recommendation remained for home health, which was ordered.  Unasyn can be switched to Augmentin on discharge for 1 more week per podiatry.  Appears volume up again today, worsening renal function and lower extremity edema, remained on room air.  Restarting IV Lasix.   Holding discharge until volume status improves.  8/1: Still having shortness of breath, orthopnea and scrotal swelling. 8/2: Urology consult per patient request.  Discontinued narcotics and other vitamins per patient request.  Had good bowel movement 8/3: DNR now. 8/4: Increased spironolactone.  Provena removed by vascular surgery 8/5: Metolazone added  Assessment and Plan: * Ischemic ulcer of lower extremity (HCC) Worsening right lower extremity redness swelling and pain over the past 2 weeks in the setting of baseline limb ischemia status post angioplasty and stenting January 2023.Noted worsening ulcer formation across multiple digits of the foot with concern for gangrene Patient reports compliance with Plavix and statin therapy Started on heparin drip in the ER Vascular procedure with extensive peripheral vascular disease, second procedure for bilateral endarterectomies and bilateral iliac stent placement with vascular surgery was done Completed Augmentin -S/p fourth and fifth toe amputation on 7/29. -Continue aspirin and Plavix Provena removed on 8/4 by vascular surgery  Scrotal swelling Due to fluid overload.  Seen by urology recommend scrotal elevation and diuresis Scrotal ultrasound not showing any acute pathology.    Acute on chronic combined systolic and diastolic CHF (congestive heart failure) (HCC) According to a prior echo done in 2020, EF of 40 to 45%. Patient with concern of shortness of breath, mild hypoxia and chest x-ray with bilateral opacities.  BNP elevated at 932. Repeat echocardiogram with further decrease of EF to 20 to 25% with regional wall motion abnormalities. -Continues to have scrotal swelling.  Scrotal ultrasound performed and is within normal limit. -Continue Lasix to 60 mg IV twice daily per cardiology.  Continue spironolactone to 50 mg daily.  Given metolazone 1 dose today per cardiology -Daily weight and BMP -Strict intake and output Net IO Since  Admission: -898.11 mL [11/25/22 1231]   Diabetic polyneuropathy associated with type 2 diabetes mellitus (HCC) Cont neurontin   Coronary artery disease with history of myocardial infarction without history of CABG Followed by Dr. [name] outpt  CAD s/p PCI to [facility] with overlapping 3 mm Xience stents of 23 mm and 12 mm length in 2011  No active chest pain Continue aspirin and Plavix  Hypertension Patient required nitro GGT after the procedure which is off now Continue Coreg, Lasix, losartan and Aldactone  Diabetes mellitus type 2, insulin dependent (HCC) SSI  Alcohol abuse 3-4 beer daily drinker + mixed drinks  off CIWA protocol  Follow   Mixed hyperlipidemia Continue statin  Adjustment disorder with depressed mood Family was concerned about some suicidal thoughts so psychiatry was consulted. They cleared him as patient denies any suicidal thoughts, stating that he was just frustrated  Constipation Now resolved He is in agreement to stop all his narcotics as he feels that had been contributing to his constipation   Subjective: Sitting in the bed.  Feeling much better and appreciative of his care.  Participated with physical therapy and was happy about that  Physical Exam: Vitals:   11/24/22 1951 11/25/22 0500 11/25/22 0530 11/25/22 0756  BP: (!) 162/76  136/66 (!) 159/71  Pulse: 63  60 (!) 58  Resp: 20  16 18   Temp: 98.2 F (36.8 C)  97.9 F (36.6 C)   TempSrc:   Oral   SpO2: 99%  99% 100%  Weight:  108.9 kg    Height:       General.  Frail elderly man, in no  acute distress. Pulmonary.  Lungs clear bilaterally, normal respiratory effort. CV.  Regular rate and rhythm, no JVD, rub or murmur. Abdomen.  Soft, distended, hypoactive bowel sounds CNS.  Alert and oriented .  No focal neurologic deficit. Extremities. 1+ LE edema, no cyanosis Psychiatry.  Judgment and insight appears normal.    Data Reviewed: Prior data reviewed  Family Communication: Son updated  at bedside Disposition: Status is: Inpatient Remains inpatient appropriate because: Severity of illness  Planned Discharge Destination:  Home  with home health  Time spent: 35 minutes  This record has been created using Conservation officer, historic buildings. Errors have been sought and corrected,but may not always be located. Such creation errors do not reflect on the standard of care.   Author: Delfino Lovett, MD 11/25/2022 12:31 PM  For on call review [email or web address].

## 2022-11-25 NOTE — Progress Notes (Signed)
Patient is not able to walk the distance required to go the bathroom, or he/she is unable to safely negotiate stairs required to access the bathroom.  A 3in1 BSC will alleviate this problem  

## 2022-11-25 NOTE — Plan of Care (Signed)
  Problem: Education: Goal: Knowledge of General Education information will improve Description: Including pain rating scale, medication(s)/side effects and non-pharmacologic comfort measures Outcome: Progressing   Problem: Health Behavior/Discharge Planning: Goal: Ability to manage health-related needs will improve Outcome: Progressing   Problem: Clinical Measurements: Goal: Ability to maintain clinical measurements within normal limits will improve Outcome: Progressing Goal: Will remain free from infection Outcome: Progressing Goal: Diagnostic test results will improve Outcome: Progressing Goal: Respiratory complications will improve Outcome: Progressing Goal: Cardiovascular complication will be avoided Outcome: Progressing   Problem: Activity: Goal: Risk for activity intolerance will decrease Outcome: Progressing   Problem: Nutrition: Goal: Adequate nutrition will be maintained Outcome: Progressing   Problem: Coping: Goal: Level of anxiety will decrease Outcome: Progressing   Problem: Elimination: Goal: Will not experience complications related to bowel motility Outcome: Progressing Goal: Will not experience complications related to urinary retention Outcome: Progressing   Problem: Pain Managment: Goal: General experience of comfort will improve Outcome: Progressing   Problem: Safety: Goal: Ability to remain free from injury will improve Outcome: Progressing   Problem: Skin Integrity: Goal: Risk for impaired skin integrity will decrease Outcome: Progressing   Problem: Education: Goal: Ability to describe self-care measures that may prevent or decrease complications (Diabetes Survival Skills Education) will improve Outcome: Progressing Goal: Individualized Educational Video(s) Outcome: Progressing   Problem: Coping: Goal: Ability to adjust to condition or change in health will improve Outcome: Progressing   Problem: Fluid Volume: Goal: Ability to  maintain a balanced intake and output will improve Outcome: Progressing   Problem: Health Behavior/Discharge Planning: Goal: Ability to identify and utilize available resources and services will improve Outcome: Progressing Goal: Ability to manage health-related needs will improve Outcome: Progressing   Problem: Metabolic: Goal: Ability to maintain appropriate glucose levels will improve Outcome: Progressing   Problem: Nutritional: Goal: Maintenance of adequate nutrition will improve Outcome: Progressing Goal: Progress toward achieving an optimal weight will improve Outcome: Progressing   Problem: Skin Integrity: Goal: Risk for impaired skin integrity will decrease Outcome: Progressing   Problem: Tissue Perfusion: Goal: Adequacy of tissue perfusion will improve Outcome: Progressing   Problem: Education: Goal: Knowledge of prescribed regimen will improve Outcome: Progressing   Problem: Activity: Goal: Ability to tolerate increased activity will improve Outcome: Progressing   Problem: Bowel/Gastric: Goal: Gastrointestinal status for postoperative course will improve Outcome: Progressing   Problem: Clinical Measurements: Goal: Postoperative complications will be avoided or minimized Outcome: Progressing Goal: Signs and symptoms of graft occlusion will improve Outcome: Progressing   Problem: Skin Integrity: Goal: Demonstration of wound healing without infection will improve Outcome: Progressing

## 2022-11-25 NOTE — Progress Notes (Signed)
Physical Therapy Treatment Patient Details Name: Manuel [name] MRN: [id number] DOB: 04/25/1945 Today's Date: 11/25/2022   History of Present Illness Pt is a 77 year old male presenting with right lower extremity ischemia and ulcer formation with gangrenous toe now s/p left common femoral, profunda femoris, and superficial femoral artery endarterectomies, Right common femoral, profunda femoris, and superficial femoral artery endarterectomies, stent placements in bilateral common iliac arteries, Additional stent placement x 2 to the left external iliac artery, Angioplasty of the right posterior tibial artery, Angioplasty of the right popliteal artery on 11/15/22;   Pmh significant for insulin-dependent DM, HTN, CAD, PAD s/p angioplasty with stent 05/14/2021. S/P 4 & 5 toe amputation with partial ray amputation on R 11/18/22    PT Comments  Pt received in [facility], he is agreeable to PT session. Pt amb 60' with RW sup A maintaining R heel PWB precautions and with min-no cuing. Pt amb to stand>sit on EOB with sup A at end of session. Pt reported nursing has allowed him to move around room with no assistance. Pt did not report pain throughout session. Pt working towards PT goals and will cont to benefit from skilled PT to address impairments (see below for detail). Pt requires RW and BSC for home safety if discharged.    If plan is discharge home, recommend the following: A little help with walking and/or transfers;A little help with bathing/dressing/bathroom;Assistance with cooking/housework;Assist for transportation;Help with stairs or ramp for entrance   Can travel by private vehicle        Equipment Recommendations  Rolling walker (2 wheels);BSC/3in1    Recommendations for Other Services       Precautions / Restrictions Precautions Precautions: Fall Restrictions Weight Bearing Restrictions: Yes RLE Weight Bearing: Partial weight bearing Other Position/Activity Restrictions: WB thru heel in ortho  wedge shoe     Mobility  Bed Mobility               General bed mobility comments: NT up in [facility]    Transfers Overall transfer level: Needs assistance Equipment used: Rolling walker (2 wheels) Transfers: Sit to/from Stand Sit to Stand: Supervision   Step pivot transfers: Supervision       General transfer comment: no cues required for safe technique.    Ambulation/Gait Ambulation/Gait assistance: Supervision Gait Distance (Feet): 60 Feet Assistive device: Rolling walker (2 wheels) Gait Pattern/deviations: Step-to pattern Gait velocity: decreased     General Gait Details: Pt eager to amb, RLE ER with occasional cues for correction and maintain within RW. Noted fw flex posture. Adjusted RW to appropriate ht. Fatigued with increased distance. Was able to maintain PWB precautions throughout amb.   Stairs             Wheelchair Mobility     Tilt Bed    Modified Rankin (Stroke Patients Only)       Balance Overall balance assessment: Needs assistance Sitting-balance support: Feet supported Sitting balance-Leahy Scale: Good     Standing balance support: Bilateral upper extremity supported, During functional activity, Reliant on assistive device for balance Standing balance-Leahy Scale: Good Standing balance comment: Requires RW for PWB on R heel                            Cognition Arousal/Alertness: Awake/alert Behavior During Therapy: WFL for tasks assessed/performed Overall Cognitive Status: Within Functional Limits for tasks assessed  Exercises      General Comments        Pertinent Vitals/Pain Pain Assessment Pain Assessment: No/denies pain Pain Score: 0-No pain    Home Living                          Prior Function            PT Goals (current goals can now be found in the care plan section) Acute Rehab PT Goals Patient Stated Goal: return home PT  Goal Formulation: With patient Time For Goal Achievement: 12/03/22 Potential to Achieve Goals: Good Progress towards PT goals: Progressing toward goals    Frequency    Min 1X/week      PT Plan Current plan remains appropriate    Co-evaluation              AM-PAC PT "6 Clicks" Mobility   Outcome Measure  Help needed turning from your back to your side while in a flat bed without using bedrails?: None Help needed moving from lying on your back to sitting on the side of a flat bed without using bedrails?: None Help needed moving to and from a bed to a chair (including a wheelchair)?: None Help needed standing up from a chair using your arms (e.g., wheelchair or bedside chair)?: A Little Help needed to walk in hospital room?: A Little Help needed climbing 3-5 steps with a railing? : A Lot 6 Click Score: 20    End of Session Equipment Utilized During Treatment: Other (comment) (P/o shoe) Activity Tolerance: Patient tolerated treatment well Patient left: in bed;with call bell/phone within reach;Other (comment) (MD at bedside) Nurse Communication: Mobility status PT Visit Diagnosis: Other abnormalities of gait and mobility (R26.89);Muscle weakness (generalized) (M62.81);Unsteadiness on feet (R26.81);Difficulty in walking, not elsewhere classified (R26.2)     Time: 1941-7408 PT Time Calculation (min) (ACUTE ONLY): 24 min  Charges:    $Gait Training: 23-37 mins PT General Charges $$ ACUTE PT VISIT: 1 Visit                    Elmon Else, SPT  Breona Cherubin 11/25/2022, 3:11 PM

## 2022-11-25 NOTE — Inpatient Diabetes Management (Signed)
Inpatient Diabetes Program Recommendations  AACE/ADA: New Consensus Statement on Inpatient Glycemic Control   Target Ranges:  Prepandial:   less than 140 mg/dL      Peak postprandial:   less than 180 mg/dL (1-2 hours)      Critically ill patients:  140 - 180 mg/dL    Latest Reference Range & Units 11/24/22 08:29 11/24/22 11:39 11/24/22 16:24 11/24/22 19:53 11/25/22 07:55  Glucose-Capillary 70 - 99 mg/dL 161 (H) 096 (H) 045 (H) 200 (H) 206 (H)   Review of Glycemic Control  Diabetes history: DM2 Outpatient Diabetes medications: 70/30 32 units BID Current orders for Inpatient glycemic control: Novolog 0-9 units TID with meals  Inpatient Diabetes Program Recommendations:    Insulin: Please consider ordering Novolog 0-5 units at bedtime and Novolog 2 units TID with meals for meal coverage if patient eats at least 50% of meals.  Thanks, Orlando Penner, RN, MSN, CDCES Diabetes Coordinator Inpatient Diabetes Program [phone] (Team Pager from 8am to 5pm)

## 2022-11-25 NOTE — Progress Notes (Signed)
[facility] CLINIC CARDIOLOGY CONSULT NOTE       Patient ID: Manuel [name] MRN: [id number] DOB/AGE: 1945/07/21 77 y.o.  Admit date: 11/10/2022 Referring Physician Dr. Arnetha Courser  Primary Physician Dr. Marcelino Duster  Primary Cardiologist Dr. 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 Bilateral LE revascularization with Dr. Wyn Quaker was planned on 7/24 but cancelled 2/2 dyspnea and heart failure symptoms. Cardiology is consulted for assistance with the patients heart failure and medical optimization prior to vascular surgery. Echo this admission showed reduced EF 25-30% with multiple rWMAs, mild to moderate MR. Now s/p bilateral common femoral, profunda femoris, SFA endarterectomies, and bilateral common iliac artery kissing expandable stents, and left external iliac stenting x 2 performed on 7/26.  Also s/p right fourth and fifth toe amputations performed 7/29.  Interval History:  - continues to feel better without dyspnea, orthopnea, or PND - still with 1-2+ pitting edema, scrotal edema improving - no chest pain    Review of systems complete and found to be negative unless listed above     Past Medical History:  Diagnosis Date   Allergies    Arthritis    Diabetes mellitus without complication (HCC)    Gout    Hard of hearing    Hyperlipidemia    Hypertension    Myocardial infarction [facility]) 2008   treated here at [facility] and transferred to [place] for stent placement   Neuropathy    knees down   Peripheral vascular disease (HCC)    Sleep apnea    "in the past"   Status post primary  angioplasty with coronary stent    Wears dentures    full upper and lower    Past Surgical History:  Procedure Laterality Date   AMPUTATION Right 11/18/2022   Procedure: AMPUTATION 4TH AND 5TH RAY;  Surgeon: Linus Galas, [name];  Location: [facility] ORS;  Service: Orthopedics/Podiatry;  Laterality: Right;  4th and 5th toe   CATARACT EXTRACTION W/PHACO Right 03/14/2021   Procedure: CATARACT EXTRACTION PHACO AND INTRAOCULAR LENS PLACEMENT (IOC) RIGHT DIABETIC;  Surgeon: Lockie Mola, MD;  Location: [facility];  Service: Ophthalmology;  Laterality: Right;  Diabetic 16.78 01:39.9   CATARACT EXTRACTION W/PHACO Left 03/28/2021   Procedure: CATARACT EXTRACTION PHACO AND INTRAOCULAR LENS PLACEMENT (IOC) LEFT DIABETIC 6.93 01:22.0;  Surgeon: Lockie Mola, MD;  Location: [facility];  Service: Ophthalmology;  Laterality: Left;  Diabetic   ENDARTERECTOMY FEMORAL Bilateral 11/15/2022   Procedure: BILATERAL COMMON FEMORAL PROFUNDA FEMORIS AND SUPERFICIAL FEMORAL ARTERY ENDARTECTOMIES, RIGHT FOGARTY EMBOLECTOMY OF THE RIGHT SFA  AND  POPLITEAL ARTERIES. AORTAGRAM AND RIGHT LOWER EXTREMITY ANGIOGRAM.;  Surgeon: Annice Needy, MD;  Location: [facility] ORS;  Service: Vascular;  Laterality: Bilateral;   INSERTION OF ILIAC STENT Bilateral 11/15/2022   Procedure: BILATERAL STENT INSERTION IN BILATERAL  COMMON ILIAC ARTERY, STENT INSERTION OF LEFT EXTERNAL ILIAC ARTERY. ANGIOPLASTY RIGHT TIBIAL  AND POPLITEAL ARTERY.;  Surgeon: Annice Needy, MD;  Location: [facility] OR[facility];  Service: Vascular;  Laterality: Bilateral;   LOWER EXTREMITY ANGIOGRAPHY Right 05/14/2021   Procedure: LOWER EXTREMITY ANGIOGRAPHY;  Surgeon: Annice Needy, MD;  Location: [facility];  Service: Cardiovascular;  Laterality: Right;   LOWER EXTREMITY ANGIOGRAPHY Right 06/14/2021   Procedure: Lower Extremity Angiography;  Surgeon: Annice Needy, MD;  Location: [facility];  Service: Cardiovascular;  Laterality: Right;   LOWER  EXTREMITY ANGIOGRAPHY Right 11/11/2022   Procedure: Lower Extremity Angiography;  Surgeon: Annice Needy, MD;  Location: [facility];  Service: Cardiovascular;  Laterality: Right;   LOWER EXTREMITY INTERVENTION Right 06/15/2021   Procedure: LOWER EXTREMITY INTERVENTION;  Surgeon: Annice Needy, MD;  Location: [facility] IN[facility] CV [facility];  Service: Cardiovascular;  Laterality: Right;   TONSILLECTOMY     had removed as a child    Medications Prior to Admission  Medication Sig Dispense Refill Last Dose   amLODipine (NORVASC) 10 MG tablet Take 10 mg by mouth daily.   Past Week   carvedilol (COREG) 12.5 MG tablet Take 1 tablet by mouth 2 (two) times daily.   Past Week   clopidogrel (PLAVIX) 75 MG tablet Take 1 tablet (75 mg total) by mouth daily. 90 tablet 1 Past Week   gabapentin (NEURONTIN) 400 MG capsule Take 800 mg by mouth 2 (two) times daily.   Past Week   insulin aspart protamine- aspart (NOVOLOG MIX 70/30) (70-30) 100 UNIT/ML injection Inject 32 Units into the skin 2 (two) times daily with a meal.   Past Week   lisinopril (ZESTRIL) 20 MG tablet Take 1 tablet (20 mg total) by mouth daily. 30 tablet 1 Past Week   apixaban (ELIQUIS) 5 MG TABS tablet Take 1 tablet (5 mg total) by mouth 2 (two) times daily. (Patient not taking: Reported on 01/25/2022) 60 tablet 1 Not Taking   aspirin EC 81 MG tablet Take 1 tablet (81 mg total) by mouth daily. (Patient not taking: Reported on 08/13/2022) 150 tablet 2 Not Taking   atorvastatin (LIPITOR) 80 MG tablet Take 1 tablet (80 mg total) by mouth daily. (Patient not taking: Reported on 11/11/2022) 30 tablet 1 Not Taking   cefdinir (OMNICEF) 300 MG capsule Take 300 mg by mouth 2 (two) times daily. (Patient not taking: Reported on 11/11/2022)   Not Taking   DULoxetine (CYMBALTA) 30 MG capsule Take 30 mg by mouth daily. (Patient not taking: Reported on 07/11/2021)      fluticasone (FLONASE) 50 MCG/ACT nasal spray Place 2 sprays into both nostrils as needed for allergies  or rhinitis. (Patient not taking: Reported on 07/11/2021)      gabapentin (NEURONTIN) 100 MG capsule Take 100 mg by mouth at bedtime. (Patient not taking: Reported on 11/11/2022)   Not Taking   Ibuprofen 200 MG CAPS Take 2 tablets by mouth 3 times/day as needed-between meals & bedtime. (Patient not taking: Reported on 11/11/2022)   Not Taking   levocetirizine (XYZAL) 5 MG tablet Take 5 mg by mouth at bedtime. (Patient not taking: Reported on 08/13/2022)   Not Taking   polyethylene glycol (MIRALAX / GLYCOLAX) 17 g packet Take 17 g by mouth daily. (Patient not taking: Reported on 08/13/2022) 14 each 0    senna-docusate (SENOKOT-S) 8.6-50 MG tablet Take 1 tablet by mouth at bedtime as needed for mild constipation. 30 tablet 0 prn   zolpidem (AMBIEN) 5 MG tablet Take 1 tablet (5 mg total) by mouth at bedtime as needed for up to 5 doses for sleep. (Patient not taking: Reported on 07/11/2021) 5 tablet 0 Not Taking   Social History   Socioeconomic History   Marital status: Married  Spouse name: Not on file   Number of children: Not on file   Years of education: Not on file   Highest education level: Not on file  Occupational History   Not on file  Tobacco Use   Smoking status: Never   Smokeless tobacco: Never  Vaping Use   Vaping status: Never Used  Substance and Sexual Activity   Alcohol use: Yes    Alcohol/week: 21.0 standard drinks of alcohol    Types: 7 Glasses of wine, 14 Cans of beer per week    Comment: occassional   Drug use: No   Sexual activity: Not on file  Other Topics Concern   Not on file  Social History Narrative   Not on file   Social Determinants of Health   Financial Resource Strain: Not on file  Food Insecurity: No Food Insecurity (11/10/2022)   Hunger Vital Sign    Worried About Running Out of Food in the Last Year: Never true    Ran Out of Food in the Last Year: Never true  Transportation Needs: No Transportation Needs (11/10/2022)   PRAPARE - Therapist, art (Medical): No    Lack of Transportation (Non-Medical): No  Physical Activity: Not on file  Stress: Not on file  Social Connections: Not on file  Intimate Partner Violence: Not At Risk (11/10/2022)   Humiliation, Afraid, Rape, and Kick questionnaire    Fear of Current or Ex-Partner: No    Emotionally Abused: No    Physically Abused: No    Sexually Abused: No    Family History  Problem Relation Age of Onset   Scoliosis Mother    Heart disease Father       Intake/Output Summary (Last 24 hours) at 11/25/2022 0755 Last data filed at 11/25/2022 0500 Gross per 24 hour  Intake 240 ml  Output 1950 ml  Net -1710 ml    Vitals:   11/24/22 1831 11/24/22 1951 11/25/22 0500 11/25/22 0530  BP: (!) 143/67 (!) 162/76  136/66  Pulse: (!) 59 63  60  Resp:  20  16  Temp:  98.2 F (36.8 C)  97.9 F (36.6 C)  TempSrc:    Oral  SpO2: 97% 99%  99%  Weight:   108.9 kg   Height:        PHYSICAL EXAM General: Pleasant conversational elderly male, well nourished, in no acute distress. Sitting upright HEENT:  Normocephalic and atraumatic. Neck:  No JVD.  Lungs: Normal respiratory effort on room air. No crackles or wheezing  Heart: HRRR . Normal S1 and S2 without gallops or murmurs.  Abdomen: Non-distended appearing adiposity.  Msk: Normal strength and tone for age. GU: scrotum with improved edema Extremities: 1-2+ bilateral lower extremity edema. surgical shoe and dressing applied to right lower extremity.   Neuro: Alert and oriented X 3. Psych:  Answers questions appropriately.   Labs: Basic Metabolic Panel: Recent Labs    11/23/22 1053 11/24/22 0640 11/25/22 0529  NA 134* 135 136  K 4.1 3.7 3.8  CL 100 100 99  CO2 24 24 25   GLUCOSE 212* 194* 199*  BUN 39* 28* 25*  CREATININE 1.18 0.85 1.06  CALCIUM 8.7* 8.5* 8.7*  MG 2.3  --  2.0   Liver Function Tests: No results for input(s): "AST", "ALT", "ALKPHOS", "BILITOT", "PROT", "ALBUMIN" in the last 72  hours. No results for input(s): "LIPASE", "AMYLASE" in the last 72 hours. CBC: Recent Labs    11/23/22 1053  11/24/22 0640  WBC 9.1 8.7  HGB 8.9* 8.7*  HCT 26.7* 26.1*  MCV 89.6 90.0  PLT 458* 459*   Cardiac Enzymes: No results for input(s): "CKTOTAL", "CKMB", "CKMBINDEX", "TROPONINIHS" in the last 72 hours. BNP: No results for input(s): "BNP" in the last 72 hours.  D-Dimer: No results for input(s): "DDIMER" in the last 72 hours. Hemoglobin A1C: No results for input(s): "HGBA1C" in the last 72 hours. Fasting Lipid Panel: No results for input(s): "CHOL", "HDL", "LDLCALC", "TRIG", "CHOLHDL", "LDLDIRECT" in the last 72 hours. Thyroid Function Tests: No results for input(s): "TSH", "T4TOTAL", "T3FREE", "THYROIDAB" in the last 72 hours.  Invalid input(s): "FREET3" Anemia Panel: No results for input(s): "VITAMINB12", "FOLATE", "FERRITIN", "TIBC", "IRON", "RETICCTPCT" in the last 72 hours.   Radiology: US SCROTUM W/DOPPLER  Result Date: 11/21/2022 CLINICAL DATA:  86310 Swelling [id number] EXAM: SCROTAL ULTRASOUND DOPPLER ULTRASOUND OF THE TESTICLES TECHNIQUE: Complete ultrasound examination of the testicles, epididymis, and other scrotal structures was performed. Color and spectral Doppler ultrasound were also utilized to evaluate blood flow to the testicles. COMPARISON:  None Available. FINDINGS: Right testicle Measurements: 4.1 x 3.1 x 3.0 cm. No mass or microlithiasis visualized. Left testicle Measurements: 4.2 x 3.0 x 3.0 cm. No mass or microlithiasis visualized. Right epididymis: Mildly heterogeneous and expanded in appearance measuring 1.0 x 1.4 x 1.6 cm. Left epididymis: Mildly heterogeneous and expanded appearance measuring 1.0 x 1.2 x 1.9 cm. Hydrocele:  Trace peritesticular fluid. Varicocele:  None visualized. Pulsed Doppler interrogation of both testes demonstrates normal low resistance arterial and venous waveforms bilaterally. Other: Soft tissue edema and dermal thickening. On several  of the cine images, there is possible echogenic areas with posterior dirty shadowing in the posterior scrotal soft tissues (for example series 10 image 40) IMPRESSION: 1. There is diffuse scrotal edema and dermal thickening. There are possible echogenic areas with posterior dirty shadowing in the posterior scrotal soft tissues suggestive of air. This is nonspecific and could reflect artifact or trapped air but subcutaneous air is in the differential. Recommend correlation with physical exam with consideration of dedicated CT low pelvis to evaluate for intradermal air if there is concern for Fournier's gangrene. 2. The epididymides are mildly heterogeneous and expanded in appearance. This is nonspecific and likely due to chronic inflammation or sequela of prior infection. 3. No sonographic evidence of testicular torsion. These results will be called to the ordering clinician or representative by the Radiologist Assistant, and communication documented in the PACS or [employer]. Electronically Signed   By: Meda Klinefelter M.D.   On: 11/21/2022 14:11   DG C-Arm 1-60 Min-No Report  Result Date: 11/15/2022 Fluoroscopy was utilized by the requesting physician.  No radiographic interpretation.   ECHOCARDIOGRAM COMPLETE  Result Date: 11/14/2022    ECHOCARDIOGRAM REPORT   Patient Name:   TADEUSZ NEALY Date of Exam: 11/14/2022 Medical Rec #:  [id number]        Height:       68.0 in Accession #:    [id number]       Weight:       191.6 lb Date of Birth:  21-Feb-1946        BSA:          2.007 m Patient Age:    77 years         BP:           151/83 mmHg Patient Gender: M  HR:           88 bpm. Exam Location:  [facility] Procedure: 2D Echo, Cardiac Doppler, Color Doppler and Strain Analysis Indications:     Dyspnea R06.00  History:         Patient has no prior history of Echocardiogram examinations.                  Previous Myocardial Infarction; Risk Factors:Hypertension and                  Diabetes.   Sonographer:     Cristela Blue Referring Phys:  [id number] Tonna Corner MICHELLE Jalil Lorusso Diagnosing Phys: Marcina Millard MD  Sonographer Comments: Global longitudinal strain was attempted. IMPRESSIONS  1. Left ventricular ejection fraction, by estimation, is 25 to 30%. The left ventricle has severely decreased function. The left ventricle demonstrates regional wall motion abnormalities (see scoring diagram/findings for description). Left ventricular diastolic parameters were normal.  2. Right ventricular systolic function is normal. The right ventricular size is normal.  3. The mitral valve is normal in structure. Mild to moderate mitral valve regurgitation. No evidence of mitral stenosis.  4. Tricuspid valve regurgitation is mild to moderate.  5. The aortic valve is normal in structure. Aortic valve regurgitation is not visualized. No aortic stenosis is present.  6. The inferior vena cava is normal in size with greater than 50% respiratory variability, suggesting right atrial pressure of 3 mmHg. FINDINGS  Left Ventricle: Left ventricular ejection fraction, by estimation, is 25 to 30%. The left ventricle has severely decreased function. The left ventricle demonstrates regional wall motion abnormalities. The left ventricular internal cavity size was normal  in size. There is no left ventricular hypertrophy. Left ventricular diastolic parameters were normal.  LV Wall Scoring: The basal anteroseptal segment, apical lateral segment, apical septal segment, and apex are akinetic. The mid anterolateral segment is hypokinetic. Right Ventricle: The right ventricular size is normal. No increase in right ventricular wall thickness. Right ventricular systolic function is normal. Left Atrium: Left atrial size was normal in size. Right Atrium: Right atrial size was normal in size. Pericardium: There is no evidence of pericardial effusion. Mitral Valve: The mitral valve is normal in structure. Mild to moderate mitral valve regurgitation. No  evidence of mitral valve stenosis. Tricuspid Valve: The tricuspid valve is normal in structure. Tricuspid valve regurgitation is mild to moderate. No evidence of tricuspid stenosis. Aortic Valve: The aortic valve is normal in structure. Aortic valve regurgitation is not visualized. No aortic stenosis is present. Aortic valve mean gradient measures 2.0 mmHg. Aortic valve peak gradient measures 4.2 mmHg. Aortic valve area, by VTI measures 2.55 cm. Pulmonic Valve: The pulmonic valve was normal in structure. Pulmonic valve regurgitation is not visualized. No evidence of pulmonic stenosis. Aorta: The aortic root is normal in size and structure. Venous: The inferior vena cava is normal in size with greater than 50% respiratory variability, suggesting right atrial pressure of 3 mmHg. IAS/Shunts: No atrial level shunt detected by color flow Doppler.  LEFT VENTRICLE PLAX 2D LVIDd:         4.60 cm      Diastology LVIDs:         4.00 cm      LV e' medial:    3.70 cm/s LV PW:         1.00 cm      LV E/e' medial:  30.3 LV IVS:        1.30 cm  LV e' lateral:   6.09 cm/s LVOT diam:     2.00 cm      LV E/e' lateral: 18.4 LV SV:         44 LV SV Index:   22 LVOT Area:     3.14 cm  LV Volumes (MOD) LV vol d, MOD A2C: 156.0 ml LV vol d, MOD A4C: 191.0 ml LV vol s, MOD A2C: 119.0 ml LV vol s, MOD A4C: 134.0 ml LV SV MOD A2C:     37.0 ml LV SV MOD A4C:     191.0 ml LV SV MOD BP:      41.9 ml RIGHT VENTRICLE RV Basal diam:  4.40 cm RV Mid diam:    3.50 cm LEFT ATRIUM             Index        RIGHT ATRIUM           Index LA diam:        3.90 cm 1.94 cm/m   RA Area:     12.60 cm LA Vol (A2C):   68.0 ml 33.89 ml/m  RA Volume:   28.00 ml  13.95 ml/m LA Vol (A4C):   57.5 ml 28.65 ml/m LA Biplane Vol: 63.6 ml 31.69 ml/m  AORTIC VALVE AV Area (Vmax):    2.43 cm AV Area (Vmean):   2.51 cm AV Area (VTI):     2.55 cm AV Vmax:           102.00 cm/s AV Vmean:          67.500 cm/s AV VTI:            0.174 m AV Peak Grad:      4.2 mmHg  AV Mean Grad:      2.0 mmHg LVOT Vmax:         79.00 cm/s LVOT Vmean:        53.900 cm/s LVOT VTI:          0.141 m LVOT/AV VTI ratio: 0.81  AORTA Ao Root diam: 3.50 cm MITRAL VALVE MV Area (PHT): 5.02 cm     SHUNTS MV Decel Time: 151 msec     Systemic VTI:  0.14 m MV E velocity: 112.00 cm/s  Systemic Diam: 2.00 cm MV A velocity: 79.10 cm/s MV E/A ratio:  1.42 Marcina Millard MD Electronically signed by Marcina Millard MD Signature Date/Time: 11/14/2022/1:14:40 PM    Final    DG Chest Port 1 View  Result Date: 11/12/2022 CLINICAL DATA:  Shortness of breath EXAM: PORTABLE CHEST 1 VIEW COMPARISON:  Prior chest x-ray 11/09/2021 FINDINGS: Diffuse irregular interstitial prominence with areas of nodularity throughout the lungs. Small bilateral pleural effusions and associated bibasilar atelectasis. Cardiac and mediastinal contours are within normal limits. Atherosclerotic calcifications are visible in the transverse aorta. No pneumothorax. No acute osseous abnormality. Bilateral glenohumeral joint osteoarthritis. IMPRESSION: Diffuse interstitial prominence with areas of nodularity throughout both lungs. Differential considerations include an acute atypical infectious/inflammatory process such as viral pneumonia versus atypical pulmonary edema. Trace bilateral pleural effusions and associated bibasilar atelectasis. Normal heart size. Electronically Signed   By: Malachy Moan M.D.   On: 11/12/2022 13:39   PERIPHERAL VASCULAR CATHETERIZATION  Result Date: 11/11/2022 See surgical note for result.  DG Foot Complete Right  Result Date: 11/10/2022 CLINICAL DATA:  Toe ulcers.  Evaluate for osteomyelitis. EXAM: RIGHT FOOT COMPLETE - 3+ VIEW COMPARISON:  None Available. FINDINGS: There are no signs of acute fracture or  dislocation. No focal bone erosions of osteomyelitis identified. Mild hallux valgus deformity with mild degenerative changes at the first MTP joint. Mild dorsal soft tissue edema. IMPRESSION:  1. No acute findings. No signs of osteomyelitis. 2. 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 Electronically Signed   By: Signa Kell M.D.   On: 11/10/2022 10:00    ECHO 11/10/2018 MILD LV SYSTOLIC DYSFUNCTION WITH AN ESTIMATED EF = 40-45 %  NORMAL RIGHT VENTRICULAR SYSTOLIC FUNCTION  MILD-TO-MODERATE MITRAL VALVE INSUFFICIENCY  TRACE TRICUSPID VALVE INSUFFICIENCY  NO VALVULAR STENOSIS  INFERIOR WALL HYPOKINESIS  _________________________________________________________________________________________  Electronically signed by      MD Arnoldo Hooker on 11/10/2018 10: 43 AM           Performed By: Luretha Murphy, RDCS, RVT     Ordering Physician: [name], MD Velna Ochs myoview 11/10/2018 Normal Lexiscan infusion EKG  Mild segmental LV systolic dysfunction with inferior hypokinesis  Fixed and minimal reversible inferior myocardial perfusion defect  consistent with mainly previous infarct with peri-infarct myocardial  ischemia  Clinical correlation advised    TELEMETRY reviewed by me (LT) 11/25/2022: None available   EKG reviewed by me: NSR 70, nonspec IVCD, hx old inferior infarct  Data reviewed by me (LT) 11/25/2022: hospitalist progress notes, PT/OT notes, palliative care note, last 24h vitals tele labs imaging I/O    Principal Problem:   Ischemic ulcer of lower extremity (HCC) Active Problems:   Hypertension   Coronary artery disease with history of myocardial infarction without history of CABG   Diabetes mellitus type 2, insulin dependent (HCC)   Diabetic polyneuropathy associated with type 2 diabetes mellitus (HCC)   Mixed hyperlipidemia   Alcohol abuse   Limb ischemia   Acute on chronic combined systolic and diastolic CHF (congestive heart failure) (HCC)   Adjustment disorder with depressed mood    ASSESSMENT AND PLAN:  Saajan P. 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 Bilateral LE revascularization with Dr. Wyn Quaker was planned on 7/24 but cancelled 2/2 dyspnea and heart failure symptoms. Cardiology is consulted for assistance with the patients heart failure and medical optimization prior to vascular surgery. Echo this admission showed reduced EF 25-30% with multiple rWMAs, mild to moderate MR. Now s/p bilateral common femoral, profunda femoris, SFA endarterectomies, and bilateral common iliac artery kissing expandable stents, and left external iliac stenting x 2 performed on 7/26.  Also s/p right fourth and fifth toe amputations performed 7/29.  # Severe PAD with lower extremity ulceration -Agree with current therapy per primary, vascular surgery, and podiatry -Unasyn>>Augmentin  # Acute on chronic HFrEF # Scrotal edema Developed dyspnea/orthopnea on 7/23 and was given IV Lasix with improvement, vascular surgery intervention canceled for 7/24 due to his heart failure exacerbation. Repeat chest x-ray with trace bilateral pleural effusions, BNP was elevated in the 900s.  S/p IV Lasix  BID for multiple days with clinical improvement. D/t increased Cr, primary team discontinued diuretics from 7/28-7/30.  Continued hypervolemia on exam with scrotal edema, and 1-2+ pitting edema bilaterally, although improving . -Renal function normalized with diuresis - continue losartan 75mg  daily and spironolactone 50mg  daily.   - Continue carvedilol  12.5 mg twice daily. - Continue IV Lasix 60 mg twice daily, likely discharge on 20mg  torsemide daily) - add metolazone 5mg  x 1 today to augment diuresis  -Stressed the importance of complete cessation from alcohol and limiting salt intake -Patient previously taking  amlodipine at home, this has been discontinued in favor of uptitrating GDMT. -Patient was educated on worsening heart function and heart failure in general, he seems somewhat reluctant to follow-up in clinic and have the burden of seeing multiple specialists to "have his life revolve around seeing a different doctor each day."  We will schedule him an appointment to follow-up with Dr. Lorelle Gibbs 1-2 weeks following hospital discharge.   This patient's plan of care was discussed and created with Dr. Melton Alar and she is in agreement.  Signed: Rebeca Allegra , PA-C 11/25/2022, 7:55 AM [facility]

## 2022-11-25 NOTE — TOC Progression Note (Signed)
Transition of Care [facility]) - Progression Note    Patient Details  Name: Manuel [name] MRN: [id number] Date of Birth: 25-May-1945  Transition of Care [facility]) CM/SW Contact  Garret Reddish, RN Phone Number: 11/25/2022, 2:55 PM  Clinical Narrative:     Chart reviewed.  I have received a call from patient's wife [name].  She informs me that patient will need a BSC and 2 wheeled rolling walker on discharge.  [name] reports that the walker that she currently has at home is too short for [name].    I have asked John with [employer] to provide a BSC and 2 wheeled rolling walker at bedside.    [facility] will provide home health services on discharge.    TOC will continue to follow for discharge planning.      Expected Discharge Plan: Home w Home Health Services    Expected Discharge Chart and Services                                               Social Determinants of Health (SDOH) Interventions SDOH Screenings   Food Insecurity: No Food Insecurity (11/10/2022)  Housing: Low Risk  (11/10/2022)  Transportation Needs: No Transportation Needs (11/10/2022)  Utilities: Not At Risk (11/10/2022)  Tobacco Use: Low Risk  (11/18/2022)  Recent Concern: Tobacco Use - Medium Risk (09/09/2022)   Received from [facility], [facility]    Readmission Risk Interventions     No data to display

## 2022-11-26 DIAGNOSIS — L03119 Cellulitis of unspecified part of limb: Secondary | ICD-10-CM | POA: Diagnosis not present

## 2022-11-26 DIAGNOSIS — L97909 Non-pressure chronic ulcer of unspecified part of unspecified lower leg with unspecified severity: Secondary | ICD-10-CM | POA: Diagnosis not present

## 2022-11-26 DIAGNOSIS — I96 Gangrene, not elsewhere classified: Secondary | ICD-10-CM

## 2022-11-26 DIAGNOSIS — I998 Other disorder of circulatory system: Secondary | ICD-10-CM | POA: Diagnosis not present

## 2022-11-26 LAB — GLUCOSE, CAPILLARY
Glucose-Capillary: 259 mg/dL — ABNORMAL HIGH (ref 70–99)
Glucose-Capillary: 180 mg/dL — ABNORMAL HIGH (ref 70–99)

## 2022-11-26 MED ORDER — ASPIRIN EC 81 MG PO TBEC: 81.0000 mg | DELAYED_RELEASE_TABLET | Freq: Every day | ORAL | 2 refills | Status: AC

## 2022-11-26 MED ORDER — TORSEMIDE 20 MG PO TABS: 20.0000 mg | ORAL_TABLET | Freq: Every day | ORAL | 0 refills | Status: DC

## 2022-11-26 MED ORDER — ENSURE MAX PROTEIN PO LIQD: 11.0000 [oz_av] | Freq: Two times a day (BID) | ORAL | 3 refills | Status: DC

## 2022-11-26 MED ORDER — LOSARTAN POTASSIUM 25 MG PO TABS: 75.0000 mg | ORAL_TABLET | Freq: Every day | ORAL | 0 refills | Status: DC

## 2022-11-26 MED ORDER — TORSEMIDE 20 MG PO TABS: 20.0000 mg | ORAL_TABLET | Freq: Every day | ORAL | Status: DC

## 2022-11-26 MED ORDER — SPIRONOLACTONE 50 MG PO TABS: 50.0000 mg | ORAL_TABLET | Freq: Every day | ORAL | 0 refills | Status: DC

## 2022-11-26 MED ORDER — ATORVASTATIN CALCIUM 80 MG PO TABS: 80.0000 mg | ORAL_TABLET | Freq: Every day | ORAL | 1 refills | Status: DC

## 2022-11-26 NOTE — Progress Notes (Signed)
[facility] CLINIC CARDIOLOGY CONSULT NOTE       Patient ID: Manuel [name] MRN: [id number] DOB/AGE: 79/07/1945 79 y.o.  Admit date: 11/10/2022 Referring Physician Dr. Arnetha Courser  Primary Physician Dr. Marcelino Duster  Primary Cardiologist Dr. 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 Bilateral LE revascularization with Dr. Wyn Quaker was planned on 7/24 but cancelled 2/2 dyspnea and heart failure symptoms. Cardiology is consulted for assistance with the patients heart failure and medical optimization prior to vascular surgery. Echo this admission showed reduced EF 25-30% with multiple rWMAs, mild to moderate MR. Now s/p bilateral common femoral, profunda femoris, SFA endarterectomies, and bilateral common iliac artery kissing expandable stents, and left external iliac stenting x 2 performed on 7/26.  Also s/p right fourth and fifth toe amputations performed 7/29.  Interval History:  - feels good today, in good spirits. LE swelling and scrotal edema much improved following metolazone - no chest pain, dyspnea, orthopnea, PND - eager to go home  Review of systems complete and found to be negative unless listed above     Past Medical History:  Diagnosis Date   Allergies    Arthritis    Diabetes mellitus without complication (HCC)    Gout    Hard of hearing    Hyperlipidemia    Hypertension    Myocardial infarction [facility]) 2008   treated here at [facility] and transferred to  for stent placement   Neuropathy    knees down   Peripheral vascular disease (HCC)    Sleep apnea    "in the past"    Status post primary angioplasty with coronary stent    Wears dentures    full upper and lower    Past Surgical History:  Procedure Laterality Date   AMPUTATION Right 11/18/2022   Procedure: AMPUTATION 4TH AND 5TH RAY;  Surgeon: Linus Galas, [name];  Location: [facility] ORS;  Service: Orthopedics/Podiatry;  Laterality: Right;  4th and 5th toe   CATARACT EXTRACTION W/PHACO Right 03/14/2021   Procedure: CATARACT EXTRACTION PHACO AND INTRAOCULAR LENS PLACEMENT (IOC) RIGHT DIABETIC;  Surgeon: Lockie Mola, MD;  Location: [facility];  Service: Ophthalmology;  Laterality: Right;  Diabetic 16.78 01:39.9   CATARACT EXTRACTION W/PHACO Left 03/28/2021   Procedure: CATARACT EXTRACTION PHACO AND INTRAOCULAR LENS PLACEMENT (IOC) LEFT DIABETIC 6.93 01:22.0;  Surgeon: Lockie Mola, MD;  Location: [facility];  Service: Ophthalmology;  Laterality: Left;  Diabetic   ENDARTERECTOMY FEMORAL Bilateral 11/15/2022   Procedure: BILATERAL COMMON FEMORAL PROFUNDA FEMORIS AND SUPERFICIAL FEMORAL ARTERY ENDARTECTOMIES, RIGHT FOGARTY EMBOLECTOMY OF THE RIGHT SFA  AND  POPLITEAL ARTERIES. AORTAGRAM AND RIGHT LOWER EXTREMITY ANGIOGRAM.;  Surgeon: Annice Needy, MD;  Location: [facility] ORS;  Service: Vascular;  Laterality: Bilateral;   INSERTION OF ILIAC STENT Bilateral 11/15/2022   Procedure: BILATERAL STENT INSERTION IN BILATERAL  COMMON ILIAC ARTERY, STENT INSERTION OF LEFT EXTERNAL ILIAC ARTERY. ANGIOPLASTY RIGHT TIBIAL  AND POPLITEAL ARTERY.;  Surgeon: Annice Needy, MD;  Location: [facility] ORS;  Service: Vascular;  Laterality: Bilateral;   LOWER EXTREMITY ANGIOGRAPHY Right 05/14/2021   Procedure: LOWER EXTREMITY ANGIOGRAPHY;  Surgeon: Festus Barren  S, MD;  Location: [facility] INVASIVE CV LAB;  Service: Cardiovascular;  Laterality: Right;   LOWER EXTREMITY ANGIOGRAPHY Right 06/14/2021   Procedure: Lower Extremity Angiography;  Surgeon: Annice Needy, MD;  Location: [facility] INVASIVE CV LAB;  Service: Cardiovascular;   Laterality: Right;   LOWER EXTREMITY ANGIOGRAPHY Right 11/11/2022   Procedure: Lower Extremity Angiography;  Surgeon: Annice Needy, MD;  Location: [facility] INVASIVE CV LAB;  Service: Cardiovascular;  Laterality: Right;   LOWER EXTREMITY INTERVENTION Right 06/15/2021   Procedure: LOWER EXTREMITY INTERVENTION;  Surgeon: Annice Needy, MD;  Location: [facility] INVASIVE CV LAB;  Service: Cardiovascular;  Laterality: Right;   TONSILLECTOMY     had removed as a child    Medications Prior to Admission  Medication Sig Dispense Refill Last Dose   amLODipine (NORVASC) 10 MG tablet Take 10 mg by mouth daily.   Past Week   carvedilol (COREG) 12.5 MG tablet Take 1 tablet by mouth 2 (two) times daily.   Past Week   clopidogrel (PLAVIX) 75 MG tablet Take 1 tablet (75 mg total) by mouth daily. 90 tablet 1 Past Week   gabapentin (NEURONTIN) 400 MG capsule Take 800 mg by mouth 2 (two) times daily.   Past Week   insulin aspart protamine- aspart (NOVOLOG MIX 70/30) (70-30) 100 UNIT/ML injection Inject 32 Units into the skin 2 (two) times daily with a meal.   Past Week   lisinopril (ZESTRIL) 20 MG tablet Take 1 tablet (20 mg total) by mouth daily. 30 tablet 1 Past Week   apixaban (ELIQUIS) 5 MG TABS tablet Take 1 tablet (5 mg total) by mouth 2 (two) times daily. (Patient not taking: Reported on 01/25/2022) 60 tablet 1 Not Taking   aspirin EC 81 MG tablet Take 1 tablet (81 mg total) by mouth daily. (Patient not taking: Reported on 08/13/2022) 150 tablet 2 Not Taking   atorvastatin (LIPITOR) 80 MG tablet Take 1 tablet (80 mg total) by mouth daily. (Patient not taking: Reported on 11/11/2022) 30 tablet 1 Not Taking   cefdinir (OMNICEF) 300 MG capsule Take 300 mg by mouth 2 (two) times daily. (Patient not taking: Reported on 11/11/2022)   Not Taking   DULoxetine (CYMBALTA) 30 MG capsule Take 30 mg by mouth daily. (Patient not taking: Reported on 07/11/2021)      fluticasone (FLONASE) 50 MCG/ACT nasal spray Place 2 sprays into both  nostrils as needed for allergies or rhinitis. (Patient not taking: Reported on 07/11/2021)      gabapentin (NEURONTIN) 100 MG capsule Take 100 mg by mouth at bedtime. (Patient not taking: Reported on 11/11/2022)   Not Taking   Ibuprofen 200 MG CAPS Take 2 tablets by mouth 3 times/day as needed-between meals & bedtime. (Patient not taking: Reported on 11/11/2022)   Not Taking   levocetirizine (XYZAL) 5 MG tablet Take 5 mg by mouth at bedtime. (Patient not taking: Reported on 08/13/2022)   Not Taking   polyethylene glycol (MIRALAX / GLYCOLAX) 17 g packet Take 17 g by mouth daily. (Patient not taking: Reported on 08/13/2022) 14 each 0    senna-docusate (SENOKOT-S) 8.6-50 MG tablet Take 1 tablet by mouth at bedtime as needed for mild constipation. 30 tablet 0 prn   zolpidem (AMBIEN) 5 MG tablet Take 1 tablet (5 mg total) by mouth at bedtime as needed for up to 5 doses for sleep. (Patient not taking: Reported on 07/11/2021) 5 tablet 0 Not Taking   Social History   Socioeconomic History   Marital status:  Married    Spouse name: Not on file   Number of children: Not on file   Years of education: Not on file   Highest education level: Not on file  Occupational History   Not on file  Tobacco Use   Smoking status: Never   Smokeless tobacco: Never  Vaping Use   Vaping status: Never Used  Substance and Sexual Activity   Alcohol use: Yes    Alcohol/week: 21.0 standard drinks of alcohol    Types: 7 Glasses of wine, 14 Cans of beer per week    Comment: occassional   Drug use: No   Sexual activity: Not on file  Other Topics Concern   Not on file  Social History Narrative   Not on file   Social Determinants of Health   Financial Resource Strain: Not on file  Food Insecurity: No Food Insecurity (11/10/2022)   Hunger Vital Sign    Worried About Running Out of Food in the Last Year: Never true    Ran Out of Food in the Last Year: Never true  Transportation Needs: No Transportation Needs (11/10/2022)    PRAPARE - Administrator, Civil Service (Medical): No    Lack of Transportation (Non-Medical): No  Physical Activity: Not on file  Stress: Not on file  Social Connections: Not on file  Intimate Partner Violence: Not At Risk (11/10/2022)   Humiliation, Afraid, Rape, and Kick questionnaire    Fear of Current or Ex-Partner: No    Emotionally Abused: No    Physically Abused: No    Sexually Abused: No    Family History  Problem Relation Age of Onset   Scoliosis Mother    Heart disease Father       Intake/Output Summary (Last 24 hours) at 11/26/2022 0813 Last data filed at 11/26/2022 0708 Gross per 24 hour  Intake 960 ml  Output 2750 ml  Net -1790 ml    Vitals:   11/25/22 1946 11/26/22 0440 11/26/22 0712 11/26/22 0801  BP: (!) 147/66 (!) 147/72  (!) 154/78  Pulse: 63 65  62  Resp: 16 18  17   Temp: 98.1 F (36.7 C) 98.5 F (36.9 C)  98.3 F (36.8 C)  TempSrc: Oral Oral  Oral  SpO2: 98% 96%  98%  Weight:   102.9 kg   Height:        PHYSICAL EXAM General: Pleasant conversational elderly male, well nourished, in no acute distress. Sitting upright HEENT:  Normocephalic and atraumatic. Neck:  No JVD.  Lungs: Normal respiratory effort on room air. No crackles or wheezing  Heart: HRRR . Normal S1 and S2 without gallops or murmurs.  Abdomen: Non-distended appearing adiposity.  Msk: Normal strength and tone for age. GU: trace scrotal swelling, significantly improved compared to last week  Extremities: trace to 1+ bilateral lower extremity edema. surgical shoe and dressing applied to right lower extremity.   Neuro: Alert and oriented X 3. Psych:  Answers questions appropriately.   Labs: Basic Metabolic Panel: Recent Labs    11/23/22 1053 11/24/22 0640 11/25/22 0529 11/26/22 0438  NA 134*   < > 136 133*  K 4.1   < > 3.8 4.0  CL 100   < > 99 97*  CO2 24   < > 25 28  GLUCOSE 212*   < > 199* 225*  BUN 39*   < > 25* 27*  CREATININE 1.18   < > 1.06 1.13  CALCIUM  8.7*   < >  8.7* 8.7*  MG 2.3  --  2.0  --    < > = values in this interval not displayed.   Liver Function Tests: No results for input(s): "AST", "ALT", "ALKPHOS", "BILITOT", "PROT", "ALBUMIN" in the last 72 hours. No results for input(s): "LIPASE", "AMYLASE" in the last 72 hours. CBC: Recent Labs    11/23/22 1053 11/24/22 0640  WBC 9.1 8.7  HGB 8.9* 8.7*  HCT 26.7* 26.1*  MCV 89.6 90.0  PLT 458* 459*   Cardiac Enzymes: No results for input(s): "CKTOTAL", "CKMB", "CKMBINDEX", "TROPONINIHS" in the last 72 hours. BNP: No results for input(s): "BNP" in the last 72 hours.  D-Dimer: No results for input(s): "DDIMER" in the last 72 hours. Hemoglobin A1C: No results for input(s): "HGBA1C" in the last 72 hours. Fasting Lipid Panel: No results for input(s): "CHOL", "HDL", "LDLCALC", "TRIG", "CHOLHDL", "LDLDIRECT" in the last 72 hours. Thyroid Function Tests: No results for input(s): "TSH", "T4TOTAL", "T3FREE", "THYROIDAB" in the last 72 hours.  Invalid input(s): "FREET3" Anemia Panel: No results for input(s): "VITAMINB12", "FOLATE", "FERRITIN", "TIBC", "IRON", "RETICCTPCT" in the last 72 hours.   Radiology: US SCROTUM W/DOPPLER  Result Date: 11/21/2022 CLINICAL DATA:  86310 Swelling [id number] EXAM: SCROTAL ULTRASOUND DOPPLER ULTRASOUND OF THE TESTICLES TECHNIQUE: Complete ultrasound examination of the testicles, epididymis, and other scrotal structures was performed. Color and spectral Doppler ultrasound were also utilized to evaluate blood flow to the testicles. COMPARISON:  None Available. FINDINGS: Right testicle Measurements: 4.1 x 3.1 x 3.0 cm. No mass or microlithiasis visualized. Left testicle Measurements: 4.2 x 3.0 x 3.0 cm. No mass or microlithiasis visualized. Right epididymis: Mildly heterogeneous and expanded in appearance measuring 1.0 x 1.4 x 1.6 cm. Left epididymis: Mildly heterogeneous and expanded appearance measuring 1.0 x 1.2 x 1.9 cm. Hydrocele:  Trace peritesticular  fluid. Varicocele:  None visualized. Pulsed Doppler interrogation of both testes demonstrates normal low resistance arterial and venous waveforms bilaterally. Other: Soft tissue edema and dermal thickening. On several of the cine images, there is possible echogenic areas with posterior dirty shadowing in the posterior scrotal soft tissues (for example series 10 image 40) IMPRESSION: 1. There is diffuse scrotal edema and dermal thickening. There are possible echogenic areas with posterior dirty shadowing in the posterior scrotal soft tissues suggestive of air. This is nonspecific and could reflect artifact or trapped air but subcutaneous air is in the differential. Recommend correlation with physical exam with consideration of dedicated CT low pelvis to evaluate for intradermal air if there is concern for Fournier's gangrene. 2. The epididymides are mildly heterogeneous and expanded in appearance. This is nonspecific and likely due to chronic inflammation or sequela of prior infection. 3. No sonographic evidence of testicular torsion. These results will be called to the ordering clinician or representative by the Radiologist Assistant, and communication documented in the PACS or [employer]. Electronically Signed   By: Meda Klinefelter M.D.   On: 11/21/2022 14:11   DG C-Arm 1-60 Min-No Report  Result Date: 11/15/2022 Fluoroscopy was utilized by the requesting physician.  No radiographic interpretation.   ECHOCARDIOGRAM COMPLETE  Result Date: 11/14/2022    ECHOCARDIOGRAM REPORT   Patient Name:   Manuel [name] Date of Exam: 11/14/2022 Medical Rec #:  [id number]        Height:       68.0 in Accession #:    [id number]       Weight:       191.6 lb Date of Birth:  1945-08-16  BSA:          2.007 m Patient Age:    77 years         BP:           151/83 mmHg Patient Gender: M                HR:           88 bpm. Exam Location:  [facility] Procedure: 2D Echo, Cardiac Doppler, Color Doppler and Strain Analysis  Indications:     Dyspnea R06.00  History:         Patient has no prior history of Echocardiogram examinations.                  Previous Myocardial Infarction; Risk Factors:Hypertension and                  Diabetes.  Sonographer:     Cristela Blue Referring Phys:  [id number] Tonna Corner MICHELLE Taiz Bickle Diagnosing Phys: Marcina Millard MD  Sonographer Comments: Global longitudinal strain was attempted. IMPRESSIONS  1. Left ventricular ejection fraction, by estimation, is 25 to 30%. The left ventricle has severely decreased function. The left ventricle demonstrates regional wall motion abnormalities (see scoring diagram/findings for description). Left ventricular diastolic parameters were normal.  2. Right ventricular systolic function is normal. The right ventricular size is normal.  3. The mitral valve is normal in structure. Mild to moderate mitral valve regurgitation. No evidence of mitral stenosis.  4. Tricuspid valve regurgitation is mild to moderate.  5. The aortic valve is normal in structure. Aortic valve regurgitation is not visualized. No aortic stenosis is present.  6. The inferior vena cava is normal in size with greater than 50% respiratory variability, suggesting right atrial pressure of 3 mmHg. FINDINGS  Left Ventricle: Left ventricular ejection fraction, by estimation, is 25 to 30%. The left ventricle has severely decreased function. The left ventricle demonstrates regional wall motion abnormalities. The left ventricular internal cavity size was normal  in size. There is no left ventricular hypertrophy. Left ventricular diastolic parameters were normal.  LV Wall Scoring: The basal anteroseptal segment, apical lateral segment, apical septal segment, and apex are akinetic. The mid anterolateral segment is hypokinetic. Right Ventricle: The right ventricular size is normal. No increase in right ventricular wall thickness. Right ventricular systolic function is normal. Left Atrium: Left atrial size was normal in  size. Right Atrium: Right atrial size was normal in size. Pericardium: There is no evidence of pericardial effusion. Mitral Valve: The mitral valve is normal in structure. Mild to moderate mitral valve regurgitation. No evidence of mitral valve stenosis. Tricuspid Valve: The tricuspid valve is normal in structure. Tricuspid valve regurgitation is mild to moderate. No evidence of tricuspid stenosis. Aortic Valve: The aortic valve is normal in structure. Aortic valve regurgitation is not visualized. No aortic stenosis is present. Aortic valve mean gradient measures 2.0 mmHg. Aortic valve peak gradient measures 4.2 mmHg. Aortic valve area, by VTI measures 2.55 cm. Pulmonic Valve: The pulmonic valve was normal in structure. Pulmonic valve regurgitation is not visualized. No evidence of pulmonic stenosis. Aorta: The aortic root is normal in size and structure. Venous: The inferior vena cava is normal in size with greater than 50% respiratory variability, suggesting right atrial pressure of 3 mmHg. IAS/Shunts: No atrial level shunt detected by color flow Doppler.  LEFT VENTRICLE PLAX 2D LVIDd:         4.60 cm      Diastology LVIDs:  4.00 cm      LV e' medial:    3.70 cm/s LV PW:         1.00 cm      LV E/e' medial:  30.3 LV IVS:        1.30 cm      LV e' lateral:   6.09 cm/s LVOT diam:     2.00 cm      LV E/e' lateral: 18.4 LV SV:         44 LV SV Index:   22 LVOT Area:     3.14 cm  LV Volumes (MOD) LV vol d, MOD A2C: 156.0 ml LV vol d, MOD A4C: 191.0 ml LV vol s, MOD A2C: 119.0 ml LV vol s, MOD A4C: 134.0 ml LV SV MOD A2C:     37.0 ml LV SV MOD A4C:     191.0 ml LV SV MOD BP:      41.9 ml RIGHT VENTRICLE RV Basal diam:  4.40 cm RV Mid diam:    3.50 cm LEFT ATRIUM             Index        RIGHT ATRIUM           Index LA diam:        3.90 cm 1.94 cm/m   RA Area:     12.60 cm LA Vol (A2C):   68.0 ml 33.89 ml/m  RA Volume:   28.00 ml  13.95 ml/m LA Vol (A4C):   57.5 ml 28.65 ml/m LA Biplane Vol: 63.6 ml 31.69  ml/m  AORTIC VALVE AV Area (Vmax):    2.43 cm AV Area (Vmean):   2.51 cm AV Area (VTI):     2.55 cm AV Vmax:           102.00 cm/s AV Vmean:          67.500 cm/s AV VTI:            0.174 m AV Peak Grad:      4.2 mmHg AV Mean Grad:      2.0 mmHg LVOT Vmax:         79.00 cm/s LVOT Vmean:        53.900 cm/s LVOT VTI:          0.141 m LVOT/AV VTI ratio: 0.81  AORTA Ao Root diam: 3.50 cm MITRAL VALVE MV Area (PHT): 5.02 cm     SHUNTS MV Decel Time: 151 msec     Systemic VTI:  0.14 m MV E velocity: 112.00 cm/s  Systemic Diam: 2.00 cm MV A velocity: 79.10 cm/s MV E/A ratio:  1.42 Marcina Millard MD Electronically signed by Marcina Millard MD Signature Date/Time: 11/14/2022/1:14:40 PM    Final    DG Chest Port 1 View  Result Date: 11/12/2022 CLINICAL DATA:  Shortness of breath EXAM: PORTABLE CHEST 1 VIEW COMPARISON:  Prior chest x-ray 11/09/2021 FINDINGS: Diffuse irregular interstitial prominence with areas of nodularity throughout the lungs. Small bilateral pleural effusions and associated bibasilar atelectasis. Cardiac and mediastinal contours are within normal limits. Atherosclerotic calcifications are visible in the transverse aorta. No pneumothorax. No acute osseous abnormality. Bilateral glenohumeral joint osteoarthritis. IMPRESSION: Diffuse interstitial prominence with areas of nodularity throughout both lungs. Differential considerations include an acute atypical infectious/inflammatory process such as viral pneumonia versus atypical pulmonary edema. Trace bilateral pleural effusions and associated bibasilar atelectasis. Normal heart size. Electronically Signed   By: Malachy Moan M.D.   On: 11/12/2022  13:39   PERIPHERAL VASCULAR CATHETERIZATION  Result Date: 11/11/2022 See surgical note for result.  DG Foot Complete Right  Result Date: 11/10/2022 CLINICAL DATA:  Toe ulcers.  Evaluate for osteomyelitis. EXAM: RIGHT FOOT COMPLETE - 3+ VIEW COMPARISON:  None Available. FINDINGS: There are  no signs of acute fracture or dislocation. No focal bone erosions of osteomyelitis identified. Mild hallux valgus deformity with mild degenerative changes at the first MTP joint. Mild dorsal soft tissue edema. IMPRESSION: 1. No acute findings. No signs of osteomyelitis. 2. 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 Electronically Signed   By: Signa Kell M.D.   On: 11/10/2022 10:00    ECHO 11/10/2018 MILD LV SYSTOLIC DYSFUNCTION WITH AN ESTIMATED EF = 40-45 %  NORMAL RIGHT VENTRICULAR SYSTOLIC FUNCTION  MILD-TO-MODERATE MITRAL VALVE INSUFFICIENCY  TRACE TRICUSPID VALVE INSUFFICIENCY  NO VALVULAR STENOSIS  INFERIOR WALL HYPOKINESIS  _________________________________________________________________________________________  Electronically signed by      MD Arnoldo Hooker on 11/10/2018 10: 43 AM           Performed By: Luretha Murphy, RDCS, RVT     Ordering Physician: [name], MD Velna Ochs myoview 11/10/2018 Normal Lexiscan infusion EKG  Mild segmental LV systolic dysfunction with inferior hypokinesis  Fixed and minimal reversible inferior myocardial perfusion defect  consistent with mainly previous infarct with peri-infarct myocardial  ischemia  Clinical correlation advised    TELEMETRY reviewed by me (LT) 11/26/2022: None available   EKG reviewed by me: NSR 70, nonspec IVCD, hx old inferior infarct  Data reviewed by me (LT) 11/26/2022: hospitalist progress notes, PT/OT notes, palliative care note, last 24h vitals tele labs imaging I/O    Principal Problem:   Ischemic ulcer of lower extremity (HCC) Active Problems:   Hypertension   Coronary artery disease with history of myocardial infarction without history of CABG   Diabetes mellitus type 2, insulin dependent (HCC)   Diabetic polyneuropathy associated with type 2 diabetes mellitus (HCC)   Mixed hyperlipidemia   Alcohol abuse   Limb ischemia   Acute on chronic combined systolic and diastolic CHF  (congestive heart failure) (HCC)   Adjustment disorder with depressed mood    ASSESSMENT AND PLAN:  Manuel [name] is a 36yoM with a PMH of CAD s/p PCI mRCA (3 overlapping Xience stents 2011), extensive PAD s/p PTA L common Iliac, R peroneal, R distal SFA/prox popliteal, and stent to R distal SFA/prox popliteal arteries (05/14/21), 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 Bilateral LE revascularization with Dr. Wyn Quaker was planned on 7/24 but cancelled 2/2 dyspnea and heart failure symptoms. Cardiology is consulted for assistance with the patients heart failure and medical optimization prior to vascular surgery. Echo this admission showed reduced EF 25-30% with multiple rWMAs, mild to moderate MR. Now s/p bilateral common femoral, profunda femoris, SFA endarterectomies, and bilateral common iliac artery kissing expandable stents, and left external iliac stenting x 2 performed on 7/26.  Also s/p right fourth and fifth toe amputations performed 7/29.  # Severe PAD with lower extremity ulceration -Agree with current therapy per primary, vascular surgery, and podiatry -Unasyn>>Augmentin - on DAPT with recent LE stenting per vascular  # Acute on chronic HFrEF # Scrotal edema Developed dyspnea/orthopnea on 7/23 and was given IV Lasix with improvement, vascular surgery intervention canceled for 7/24 due to his heart failure exacerbation. Repeat chest x-ray with trace bilateral pleural effusions,  BNP was elevated in the 900s.  S/p IV Lasix  BID for multiple days with clinical improvement. D/t increased Cr, primary team discontinued diuretics from 7/28-7/30.  Clinically euvolemic on exam today.  -Renal function normalized with diuresis - continue losartan 75mg  daily and spironolactone 50mg  daily.   - Continue carvedilol 12.5 mg twice daily. - give IV Lasix 60 mg x 1 this AM - discharge on 20mg   torsemide daily - s/p  metolazone 5mg  x 1 on 8/5 with excellent response -Stressed the importance of complete cessation from alcohol and limiting salt intake -Patient previously taking amlodipine at home, this has been discontinued in favor of uptitrating GDMT. - agreeable to outpatient cardiology follow up   [facility] for discharge today from a cardiac perspective. Will arrange for follow up in clinic with Dr. Lorelle Gibbs in 1-2 weeks.    This patient's plan of care was discussed and created with Dr. Melton Alar and she is in agreement.  Signed: Rebeca Allegra , PA-C 11/26/2022, 8:13 AM [facility]

## 2022-11-26 NOTE — Progress Notes (Signed)
Occupational Therapy Treatment Patient Details Name: NICKHOLAS MOUND MRN: [id number] DOB: [birth year] Today's Date: 11/26/2022   History of present illness Pt is a 77 year old male presenting with right lower extremity ischemia and ulcer formation with gangrenous toe now s/p left common femoral, profunda femoris, and superficial femoral artery endarterectomies, Right common femoral, profunda femoris, and superficial femoral artery endarterectomies, stent placements in bilateral common iliac arteries, Additional stent placement x 2 to the left external iliac artery, Angioplasty of the right posterior tibial artery, Angioplasty of the right popliteal artery on 11/15/22;   Pmh significant for insulin-dependent DM, HTN, CAD, PAD s/p angioplasty with stent 05/14/2021. 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 Pt performs supine >sit without assistance. Pt manages urinal without assistance to void. Pt needing assistance to don R darco shoe but able to thread shorts on and stands with min guard to pull over B hips. UB dressing with set up A to obtain needed items. Once fully dressed, pt stands and ambulates with RW 100' while maintaining PWB in heel only during session before returning back to room. Pt seated on EOB with RN present to give medications. Call bell and all needed items within reach.    Recommendations for follow up therapy are one component of a multi-disciplinary discharge planning process, led by the attending physician.  Recommendations may be updated based on patient status, additional functional criteria and insurance authorization.    Assistance Recommended at Discharge Frequent or constant Supervision/Assistance  Patient can return home with the following  Assistance with cooking/housework;Assist for transportation;Help with stairs or ramp for entrance;A little help with walking  and/or transfers;A little help with bathing/dressing/bathroom   Equipment Recommendations  BSC/3in1;Other (comment) (RW)       Precautions / Restrictions Precautions Precautions: Fall Restrictions Weight Bearing Restrictions: Yes RLE Weight Bearing: Partial weight bearing Other Position/Activity Restrictions: WB thru heel in ortho wedge shoe       Mobility Bed Mobility Overal bed mobility: Modified Independent                  Transfers Overall transfer level: Needs assistance Equipment used: Rolling walker (2 wheels) Transfers: Sit to/from Stand Sit to Stand: Supervision                 Balance Overall balance assessment: Needs assistance Sitting-balance support: Feet supported Sitting balance-Leahy Scale: Good     Standing balance support: Bilateral upper extremity supported, During functional activity, Reliant on assistive device for balance Standing balance-Leahy Scale: Good                             ADL either performed or assessed with clinical judgement   ADL Overall ADL's : Needs assistance/impaired                 Upper Body Dressing : Set up;Supervision/safety;Sitting   Lower Body Dressing: Minimal assistance;Sit to/from stand                      Extremity/Trunk Assessment Upper Extremity Assessment Upper Extremity Assessment: Overall WFL for tasks assessed             Cognition Arousal/Alertness: Awake/alert Behavior During Therapy: WFL for tasks assessed/performed Overall Cognitive Status: Within Functional Limits for tasks assessed  Pertinent Vitals/ Pain       Pain Assessment Pain Assessment: No/denies pain Pain Score: 0-No pain         Frequency  Min 1X/week        Progress Toward Goals  OT Goals(current goals can now be found in the care plan section)  Progress towards OT goals: Progressing toward goals      Plan Discharge plan remains appropriate;Frequency remains appropriate       AM-PAC OT "6 Clicks" Daily Activity     Outcome Measure   Help from another person eating meals?: None Help from another person taking care of personal grooming?: None Help from another person toileting, which includes using toliet, bedpan, or urinal?: A Little Help from another person bathing (including washing, rinsing, drying)?: A Little Help from another person to put on and taking off regular upper body clothing?: None Help from another person to put on and taking off regular lower body clothing?: A Lot 6 Click Score: 20    End of Session Equipment Utilized During Treatment: Rolling walker (2 wheels)  OT Visit Diagnosis: Other abnormalities of gait and mobility (R26.89);Unsteadiness on feet (R26.81);Muscle weakness (generalized) (M62.81)   Activity Tolerance Patient tolerated treatment well   Patient Left in bed;with call bell/phone within reach;with bed alarm set;with family/visitor present   Nurse Communication Other (comment)        Time: 1610-9604 OT Time Calculation (min): 23 min  Charges: OT General Charges $OT Visit: 1 Visit OT Treatments $Self Care/Home Management : 8-22 mins $Therapeutic Activity: 8-22 mins  Jackquline Denmark, MS, OTR/L , CBIS ascom [phone]  11/26/22, 12:17 PM

## 2022-11-26 NOTE — Progress Notes (Signed)
All belongings sent home with patient, discharge education and instructions provided. IV removed.

## 2022-11-26 NOTE — Progress Notes (Signed)
8 Days Post-Op   Subjective/Chief Complaint: Patient seen.  No specific complaints.  Preparing to go home today.   Objective: Vital signs in last 24 hours: Temp:  [98.1 F (36.7 C)-98.5 F (36.9 C)] 98.3 F (36.8 C) (08/06 0801) Pulse Rate:  [62-65] 62 (08/06 0801) Resp:  [16-19] 17 (08/06 0801) BP: (145-154)/(66-78) 154/78 (08/06 0801) SpO2:  [96 %-98 %] 98 % (08/06 0801) Weight:  [102.9 kg] 102.9 kg (08/06 0712) Last BM Date : 11/25/22  Intake/Output from previous day: 08/05 0701 - 08/06 0700 In: 960 [P.O.:960] Out: 900 [Urine:900] Intake/Output this shift: Total I/O In: 240 [P.O.:240] Out: 1850 [Urine:1850]  Bandage on the right foot is dry and intact.  Upon removal the incision is well coapted with just a small amount of dried blood and minimal drainage from the distal aspect.  Skin edges are viable with no necrosis.  Lab Results:  Recent Labs    11/24/22 0640  WBC 8.7  HGB 8.7*  HCT 26.1*  PLT 459*   BMET Recent Labs    11/25/22 0529 11/26/22 0438  NA 136 133*  K 3.8 4.0  CL 99 97*  CO2 25 28  GLUCOSE 199* 225*  BUN 25* 27*  CREATININE 1.06 1.13  CALCIUM 8.7* 8.7*   PT/INR No results for input(s): "LABPROT", "INR" in the last 72 hours. ABG No results for input(s): "PHART", "HCO3" in the last 72 hours.  Invalid input(s): "PCO2", "PO2"  Studies/Results: No results found.  Anti-infectives: Anti-infectives (From admission, onward)    Start     Dose/Rate Route Frequency Ordered Stop   11/20/22 2200  amoxicillin-clavulanate (AUGMENTIN) 875-125 MG per tablet 1 tablet  Status:  Discontinued        1 tablet Oral Every 12 hours 11/20/22 1353 11/25/22 1203   11/15/22 1915  ceFAZolin (ANCEF) IVPB 2g/100 mL premix        2 g 200 mL/hr over 30 Minutes Intravenous Every 8 hours 11/15/22 1820 11/16/22 0534   11/13/22 1800  Ampicillin-Sulbactam (UNASYN) 3 g in sodium chloride 0.9 % 100 mL IVPB        3 g 200 mL/hr over 30 Minutes Intravenous Every 6 hours  11/13/22 1432 11/20/22 1859   11/13/22 0636  ceFAZolin (ANCEF) IVPB 2g/100 mL premix  Status:  Discontinued        2 g 200 mL/hr over 30 Minutes Intravenous 30 min pre-op 11/13/22 0636 11/13/22 0814   11/11/22 1338  ceFAZolin (ANCEF) IVPB 2g/100 mL premix  Status:  Discontinued        2 g 200 mL/hr over 30 Minutes Intravenous 30 min pre-op 11/11/22 1338 11/11/22 1608   11/11/22 1200  vancomycin (VANCOREADY) IVPB 1750 mg/350 mL  Status:  Discontinued        1,750 mg 175 mL/hr over 120 Minutes Intravenous Every 24 hours 11/10/22 1142 11/13/22 1432   11/10/22 1400  ceFEPIme (MAXIPIME) 2 g in sodium chloride 0.9 % 100 mL IVPB  Status:  Discontinued        2 g 200 mL/hr over 30 Minutes Intravenous Every 8 hours 11/10/22 1129 11/13/22 1432   11/10/22 1130  metroNIDAZOLE (FLAGYL) IVPB 500 mg  Status:  Discontinued        500 mg 100 mL/hr over 60 Minutes Intravenous Every 12 hours 11/10/22 1119 11/13/22 1432   11/10/22 1030  vancomycin (VANCOREADY) IVPB 2000 mg/400 mL        2,000 mg 200 mL/hr over 120 Minutes Intravenous  Once 11/10/22 1027  11/10/22 1331   11/10/22 1000  piperacillin-tazobactam (ZOSYN) IVPB 3.375 g        3.375 g 100 mL/hr over 30 Minutes Intravenous  Once 11/10/22 0949 11/10/22 1054       Assessment/Plan: s/p Procedure(s) with comments: 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  Plan: Betadine gauze applied to the incision followed by a sterile dressing.  Patient instructed to keep this clean and dry and do not remove.  Instructed on weightbearing with pressure only on the heel of the right foot using his wedge surgical shoe.  Schedule follow-up outpatient for next week.  LOS: 16 days    Ricci Barker 11/26/2022

## 2022-11-26 NOTE — TOC Transition Note (Signed)
Transition of Care [facility]) - CM/SW Discharge Note   Patient Details  Name: Manuel [name] MRN: [id number] Date of Birth: 05/23/1945  Transition of [facility] CM/SW Contact:  Garret Reddish, RN Phone Number: 11/26/2022, 11:46 AM   Clinical Narrative:   Chart reviewed.  Patient has orders for discharge today.    I have spoken with patient and he informs me that he has received his 2 wheeled rolling walker at bedside today.  He also informs me that he will be gifted a BSC.  [employer] has provided patient 2 wheeled rolling walker.    I have informed Barbara Cower with [facility] that patient will be a discharge for today.  [facility] will provide Home Health PT, OT and RN for home services.    Patient's wife will transport him home today.    I have informed staff nurse of the above information.        Final next level of care: Home w Home Health Services Barriers to Discharge: No Barriers Identified   Patient Goals and CMS Choice CMS Medicare.gov Compare Post Acute Care list provided to:: Patient Choice offered to / list presented to : Patient  Discharge Placement                    Name of family member notified: Vedh Brito Patient and family notified of of transfer: 11/26/22  Discharge Plan and Services Additional resources added to the After Visit Summary for                  DME Arranged: Walker rolling (Patient reports that he has a BSC that someone will be gifting to him.) DME Agency: [facility] Date DME Agency Contacted: 11/25/22 Time DME Agency Contacted: 1000 Representative spoke with at DME Agency: Jonny Ruiz HH Arranged: RN, PT, OT [facility] Agency: [facility] ([facility] Date HH Agency Contacted: 11/26/22 Time HH Agency Contacted: 1000 Representative spoke with at [facility] Agency: Barbara Cower  Social Determinants of Health (SDOH) Interventions SDOH Screenings   Food Insecurity: No Food Insecurity (11/10/2022)  Housing: Low Risk  (11/10/2022)  Transportation Needs: No  Transportation Needs (11/10/2022)  Utilities: Not At Risk (11/10/2022)  Tobacco Use: Low Risk  (11/18/2022)  Recent Concern: Tobacco Use - Medium Risk (09/09/2022)   Received from [facility], [facility]     Readmission Risk Interventions     No data to display

## 2022-11-26 NOTE — Plan of Care (Signed)

## 2022-11-27 NOTE — Discharge Summary (Signed)
Physician Discharge Summary   Patient: Manuel [name] MRN: [id number] DOB: 1945/11/11  Admit date:     11/10/2022  Discharge date: 11/26/2022  Discharge Physician: Delfino Lovett   PCP: Gracelyn Nurse, MD   Recommendations at discharge:   Follow-up with outpatient providers as requested Continue Betadine gauze applied to the incision followed by a sterile dressing.  Patient instructed to keep this clean and dry and do not remove.  Instructed on weightbearing with pressure only on the heel of the right foot using his wedge surgical shoe.  Schedule podiatry follow-up outpatient for next week.  Discharge Diagnoses: Principal Problem:   Ischemic ulcer of lower extremity (HCC) Active Problems:   Diabetic polyneuropathy associated with type 2 diabetes mellitus (HCC)   Coronary artery disease with history of myocardial infarction without history of CABG   Hypertension   Diabetes mellitus type 2, insulin dependent (HCC)   Alcohol abuse   Mixed hyperlipidemia   Limb ischemia   Acute on chronic combined systolic and diastolic CHF (congestive heart failure) (HCC)   Adjustment disorder with depressed mood   Cellulitis of foot   Toe necrosis [facility])  Hospital Course: Assessment and Plan:  Manuel [name] is a 77 y.o. male with medical history significant of  insulin-dependent DM, HTN, CAD, PAD s/p angioplasty with stent 05/14/2021 presenting with right lower extremity ischemia and ulcer formation with gangrenous toe.   Presented to the ER afebrile, hemodynamically stable. White count 11.2, hemoglobin 15.2, platelets 303, lactate 1.2, creatinine 0.9. T. bili 1.5. Plain films of the right foot with no signs of osteomyelitis. Dr. [name] with vascular surgery initially consulted with recommendation for heparin drip. Dr. [name] with podiatry also consulted for further evaluation.    7/22: Vital stable.  Going for lower extremity angiography and possible angioplasty with vascular today.  CRP elevated at  10.1, decreased prealbumin at 15 and normal ESR.  Preliminary blood cultures negative.  Continue current broad-spectrum antibiotics.   Found to have very extensive disease and most likely will required bilateral femoral endarterectomies and bilateral iliac stents.  Vascular is looking for a OR time for this extensive procedure which will require 4 to 5 hours.   7/23: Vital and labs stable.  Overnight some shortness of breath and crackles.  One-time 40 mg of IV Lasix was given.  Chest x-ray ordered but patient refused. Patient had a choking spell on meds this morning, continued to feel something sticking in his chest.  No hypoxia.  Chest x-ray with diffuse interstitial prominence and areas of nodularity throughout the both lungs.  Differential include atypical inflammatory process versus atypical pulmonary edema.  Ordered 1 dose of IV Lasix Going for his extensive vascular procedure tomorrow.   7/24: Mildly elevated blood pressure.  BNP elevated at 932, surgery got canceled for concern of acute on chronic heart failure and patient now need cardiology clearance before proceeding. Family concern of suicidal thoughts, psych was consulted.   7/25: Hemodynamically stable.  Will echocardiogram with EF of 25 to 30% with some regional wall motion abnormalities.  Likely weak going for vascular surgery tomorrow, pending final recommendations from cardiology. Psychiatry with no suicidal thoughts.   7/26: Hemodynamically stable.  Going for vascular procedure today.   7/27: Vital stable.  Patient underwent extensive vascular procedure which involved bilateral lower extremity endarterectomies and multiple stents placement.  Pulses present with Doppler this morning.  Significant blood loss during the procedure, hemoglobin decreased to 11.5 this morning, baseline of 13.  Started on  supplement.   7/28: Vital stable.  Labs with increase in creatinine to 1.31-holding Lasix today.  Hemoglobin further decreased to 9.4.   Likely be going for toe amputation tomorrow.  Arterial line removed.   7/29: Going for toes removal today.  Slight worsening of renal function today, holding diuretic and giving some IV fluid as patient is n.p.o.   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  Hemoglobin decreased to 7.8 postsurgically. Patient will be going home with 2 wound VAC in place in his groin areas from vascular surgery and they will remove it in the clinic on 11/25/2022.  Repeat PT and OT evaluation pending but he would like to go home with home health.  If remains stable likely can be discharged tomorrow. Heparin infusion is being discontinued and patient will be started on aspirin and Plavix per vascular surgery.   7/31: Hemodynamically stable.  Physical therapy recommendation remained for home health, which was ordered.  Unasyn can be switched to Augmentin on discharge for 1 more week per podiatry.  Appears volume up again today, worsening renal function and lower extremity edema, remained on room air.  Restarting IV Lasix.  Holding discharge until volume status improves.   8/1: Still having shortness of breath, orthopnea and scrotal swelling. 8/2: Urology consult per patient request.  Discontinued narcotics and other vitamins per patient request.  Had good bowel movement 8/3: DNR now. 8/4: Increased spironolactone.  Provena removed by vascular surgery 8/5: Metolazone added   Assessment and Plan: * Ischemic ulcer of lower extremity (HCC) status post angioplasty and stenting January 2023.Noted worsening ulcer formation across multiple digits of the foot with concern for gangrene Started on heparin drip in the ER Vascular procedure with extensive peripheral vascular disease, second procedure for bilateral endarterectomies and bilateral iliac stent placement with 3-day course of antibiotics -S/p fourth and fifth toe amputation on 7/29. 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 Continue Betadine gauze applied to the incision followed by a sterile dressing.  Patient instructed to keep this clean and dry and do not remove.  Instructed on weightbearing with pressure only on the heel of the right foot using his wedge surgical shoe.  Schedule podiatry follow-up outpatient for next week.   Scrotal swelling Due to fluid overload.  Seen by urology recommend scrotal elevation and diuresis Scrotal ultrasound not showing any acute pathology.     Acute on chronic combined systolic and diastolic CHF (congestive heart failure) (HCC) According to a prior echo done in 2020, EF of 40 to 45%. Patient with concern of shortness of breath, mild hypoxia and chest x-ray with bilateral opacities.  BNP elevated at 932. Repeat echocardiogram with further decrease of EF to 20 to 25% with regional wall motion abnormalities. -Diuresing initially with diuretics.  Cardiology was following.  He is euvolemic on the day of discharge Net IO Since Admission: -2,208.11 mL [11/27/22 1325]    Diabetic polyneuropathy associated with type 2 diabetes mellitus (HCC) Cont neurontin    Coronary artery disease with history of myocardial infarction without history of CABG CAD s/p PCI to [facility] with overlapping 3 mm Xience stents of 23 mm and 12 mm length in 2011  No active chest pain Continue aspirin and Plavix   Hypertension Continue Coreg, Lasix, losartan and Aldactone   Diabetes mellitus type 2, insulin dependent (  HCC)   Alcohol abuse 3-4 beer daily drinker + mixed drinks  Was counseled.  He is hoping to stop.   Mixed hyperlipidemia Continue statin   Adjustment disorder with depressed mood Family was concerned about some suicidal thoughts so psychiatry was consulted. They cleared him as patient denies any suicidal thoughts, stating that he was just frustrated    Constipation Now resolved He is in agreement to stop all his narcotics as he feels that had been contributing to his constipation           Consultants: Podiatry, vascular surgery, cardiology Procedures performed: Vascular procedure with extensive peripheral vascular disease, second procedure for bilateral endarterectomies and bilateral iliac stent placement with 3-day course of antibiotics -S/p fourth and fifth toe amputation on 7/29.  Disposition: Home Diet recommendation:  Discharge Diet Orders (From admission, onward)     Start     Ordered   11/26/22 0000  Diet - low sodium heart healthy        11/26/22 1017           Carb modified diet DISCHARGE MEDICATION: Allergies as of 11/26/2022   No Known Allergies      Medication List     STOP taking these medications    apixaban 5 MG Tabs tablet Commonly known as: ELIQUIS   cefdinir 300 MG capsule Commonly known as: OMNICEF   DULoxetine 30 MG capsule Commonly known as: CYMBALTA   fluticasone 50 MCG/ACT nasal spray Commonly known as: FLONASE   Ibuprofen 200 MG Caps   levocetirizine 5 MG tablet Commonly known as: XYZAL   lisinopril 20 MG tablet Commonly known as: ZESTRIL   zolpidem 5 MG tablet Commonly known as: AMBIEN       TAKE these medications    amLODipine 10 MG tablet Commonly known as: NORVASC Take 10 mg by mouth daily.   aspirin EC 81 MG tablet Take 1 tablet (81 mg total) by mouth daily.   atorvastatin 80 MG tablet Commonly known as: LIPITOR Take 1 tablet (80 mg total) by mouth daily.   carvedilol 12.5 MG tablet Commonly known as: COREG Take 1 tablet by mouth 2 (two) times daily.   clopidogrel 75 MG tablet Commonly known as: PLAVIX Take 1 tablet (75 mg total) by mouth daily.   Ensure Max Protein Liqd Take 330 mLs (11 oz total) by mouth 2 (two) times daily.   gabapentin 400 MG capsule Commonly known as: NEURONTIN Take 800 mg by mouth 2 (two) times daily. What changed: Another  medication with the same name was removed. Continue taking this medication, and follow the directions you see here.   insulin aspart protamine- aspart (70-30) 100 UNIT/ML injection Commonly known as: NOVOLOG MIX 70/30 Inject 32 Units into the skin 2 (two) times daily with a meal.   losartan 25 MG tablet Commonly known as: COZAAR Take 3 tablets (75 mg total) by mouth daily.   polyethylene glycol 17 g packet Commonly known as: MIRALAX / GLYCOLAX Take 17 g by mouth daily.   senna-docusate 8.6-50 MG tablet Commonly known as: Senokot-S Take 1 tablet by mouth at bedtime as needed for mild constipation.   spironolactone 50 MG tablet Commonly known as: ALDACTONE Take 1 tablet (50 mg total) by mouth daily.   torsemide 20 MG tablet Commonly known as: DEMADEX Take 1 tablet (20 mg total) by mouth daily.               Discharge Care Instructions  (From admission, onward)  Start     Ordered   11/26/22 0000  Leave dressing on - Keep it clean, dry, and intact until clinic visit        11/26/22 1017            Follow-up Information     Pendyal, Allene Dillon, MD. Go in 1 week(s).   Specialty: Cardiology Why: Patient to make follow up APPT Contact information: [address] [place] [address] [phone]         Gracelyn Nurse, MD. Schedule an appointment as soon as possible for a visit in 2 week(s).   Specialty: Internal Medicine Why: Patient to make follow up APPT  [facility] Discharge F/UP Contact information: [address] [place] [address] [phone]         Linus Galas, [name]. Schedule an appointment as soon as possible for a visit in 1 week(s).   Specialty: Podiatry Why: [facility] Discharge F/UP Contact information: [address] [place] [address] [phone]                Discharge Exam: Filed Weights   11/24/22 0500 11/25/22 0500 11/26/22 8756  Weight: 108.9 kg 108.9 kg 102.9 kg    General.  Frail elderly man, in no acute distress. Pulmonary.  Lungs clear bilaterally, normal respiratory effort. CV.  Regular rate and rhythm, no JVD, rub or murmur. Abdomen.  Soft, distended, hypoactive bowel sounds CNS.  Alert and oriented .  No focal neurologic deficit. Extremities. Trace LE edema, no cyanosis Skin:     Psychiatry.  Judgment and insight appears normal.    Condition at discharge: fair  The results of significant diagnostics from this hospitalization (including imaging, microbiology, ancillary and laboratory) are listed below for reference.   Imaging Studies: US SCROTUM W/DOPPLER  Result Date: 11/21/2022 CLINICAL DATA:  86310 Swelling [id number] EXAM: SCROTAL ULTRASOUND DOPPLER ULTRASOUND OF THE TESTICLES TECHNIQUE: Complete ultrasound examination of the testicles, epididymis, and other scrotal structures was performed. Color and spectral Doppler ultrasound were also utilized to evaluate blood flow to the testicles. COMPARISON:  None Available. FINDINGS: Right testicle Measurements: 4.1 x 3.1 x 3.0 cm. No mass or microlithiasis visualized. Left testicle Measurements: 4.2 x 3.0 x 3.0 cm. No mass or microlithiasis visualized. Right epididymis: Mildly heterogeneous and expanded in appearance measuring 1.0 x 1.4 x 1.6 cm. Left epididymis: Mildly heterogeneous and expanded appearance measuring 1.0 x 1.2 x 1.9 cm. Hydrocele:  Trace peritesticular fluid. Varicocele:  None visualized. Pulsed Doppler interrogation of both testes demonstrates normal low resistance arterial and venous waveforms bilaterally. Other: Soft tissue edema and dermal thickening. On several of the cine images, there is possible echogenic areas with posterior dirty shadowing in the posterior scrotal soft tissues (for example series 10 image 40) IMPRESSION: 1. There is diffuse scrotal edema and dermal thickening. There are possible echogenic areas with posterior dirty shadowing in the posterior scrotal soft tissues  suggestive of air. This is nonspecific and could reflect artifact or trapped air but subcutaneous air is in the differential. Recommend correlation with physical exam with consideration of dedicated CT low pelvis to evaluate for intradermal air if there is concern for Fournier's gangrene. 2. The epididymides are mildly heterogeneous and expanded in appearance. This is nonspecific and likely due to chronic inflammation or sequela of prior infection. 3. No sonographic evidence of testicular torsion. These results will be called to the ordering clinician or representative by the Radiologist Assistant, and communication documented in the PACS or [facility]  [employer]. Electronically Signed   By: Meda Klinefelter M.D.   On: 11/21/2022 14:11   DG C-Arm 1-60 Min-No Report  Result Date: 11/15/2022 Fluoroscopy was utilized by the requesting physician.  No radiographic interpretation.   ECHOCARDIOGRAM COMPLETE  Result Date: 11/14/2022    ECHOCARDIOGRAM REPORT   Patient Name:   Manuel [name] Date of Exam: 11/14/2022 Medical Rec #:  [id number]        Height:       68.0 in Accession #:    [id number]       Weight:       191.6 lb Date of Birth:  07-21-1945        BSA:          2.007 m Patient Age:    50 years         BP:           151/83 mmHg Patient Gender: M                HR:           88 bpm. Exam Location:  [facility] Procedure: 2D Echo, Cardiac Doppler, Color Doppler and Strain Analysis Indications:     Dyspnea R06.00  History:         Patient has no prior history of Echocardiogram examinations.                  Previous Myocardial Infarction; Risk Factors:Hypertension and                  Diabetes.  Sonographer:     Cristela Blue Referring Phys:  [id number] Tonna Corner MICHELLE TANG Diagnosing Phys: Marcina Millard MD  Sonographer Comments: Global longitudinal strain was attempted. IMPRESSIONS  1. Left ventricular ejection fraction, by estimation, is 25 to 30%. The left ventricle has severely decreased function. The left ventricle  demonstrates regional wall motion abnormalities (see scoring diagram/findings for description). Left ventricular diastolic parameters were normal.  2. Right ventricular systolic function is normal. The right ventricular size is normal.  3. The mitral valve is normal in structure. Mild to moderate mitral valve regurgitation. No evidence of mitral stenosis.  4. Tricuspid valve regurgitation is mild to moderate.  5. The aortic valve is normal in structure. Aortic valve regurgitation is not visualized. No aortic stenosis is present.  6. The inferior vena cava is normal in size with greater than 50% respiratory variability, suggesting right atrial pressure of 3 mmHg. FINDINGS  Left Ventricle: Left ventricular ejection fraction, by estimation, is 25 to 30%. The left ventricle has severely decreased function. The left ventricle demonstrates regional wall motion abnormalities. The left ventricular internal cavity size was normal  in size. There is no left ventricular hypertrophy. Left ventricular diastolic parameters were normal.  LV Wall Scoring: The basal anteroseptal segment, apical lateral segment, apical septal segment, and apex are akinetic. The mid anterolateral segment is hypokinetic. Right Ventricle: The right ventricular size is normal. No increase in right ventricular wall thickness. Right ventricular systolic function is normal. Left Atrium: Left atrial size was normal in size. Right Atrium: Right atrial size was normal in size. Pericardium: There is no evidence of pericardial effusion. Mitral Valve: The mitral valve is normal in structure. Mild to moderate mitral valve regurgitation. No evidence of mitral valve stenosis. Tricuspid Valve: The tricuspid valve is normal in structure. Tricuspid valve regurgitation is mild to moderate. No evidence of tricuspid stenosis. Aortic Valve: The aortic valve is normal in structure. Aortic valve regurgitation is  not visualized. No aortic stenosis is present. Aortic valve  mean gradient measures 2.0 mmHg. Aortic valve peak gradient measures 4.2 mmHg. Aortic valve area, by VTI measures 2.55 cm. Pulmonic Valve: The pulmonic valve was normal in structure. Pulmonic valve regurgitation is not visualized. No evidence of pulmonic stenosis. Aorta: The aortic root is normal in size and structure. Venous: The inferior vena cava is normal in size with greater than 50% respiratory variability, suggesting right atrial pressure of 3 mmHg. IAS/Shunts: No atrial level shunt detected by color flow Doppler.  LEFT VENTRICLE PLAX 2D LVIDd:         4.60 cm      Diastology LVIDs:         4.00 cm      LV e' medial:    3.70 cm/s LV PW:         1.00 cm      LV E/e' medial:  30.3 LV IVS:        1.30 cm      LV e' lateral:   6.09 cm/s LVOT diam:     2.00 cm      LV E/e' lateral: 18.4 LV SV:         44 LV SV Index:   22 LVOT Area:     3.14 cm  LV Volumes (MOD) LV vol d, MOD A2C: 156.0 ml LV vol d, MOD A4C: 191.0 ml LV vol s, MOD A2C: 119.0 ml LV vol s, MOD A4C: 134.0 ml LV SV MOD A2C:     37.0 ml LV SV MOD A4C:     191.0 ml LV SV MOD BP:      41.9 ml RIGHT VENTRICLE RV Basal diam:  4.40 cm RV Mid diam:    3.50 cm LEFT ATRIUM             Index        RIGHT ATRIUM           Index LA diam:        3.90 cm 1.94 cm/m   RA Area:     12.60 cm LA Vol (A2C):   68.0 ml 33.89 ml/m  RA Volume:   28.00 ml  13.95 ml/m LA Vol (A4C):   57.5 ml 28.65 ml/m LA Biplane Vol: 63.6 ml 31.69 ml/m  AORTIC VALVE AV Area (Vmax):    2.43 cm AV Area (Vmean):   2.51 cm AV Area (VTI):     2.55 cm AV Vmax:           102.00 cm/s AV Vmean:          67.500 cm/s AV VTI:            0.174 m AV Peak Grad:      4.2 mmHg AV Mean Grad:      2.0 mmHg LVOT Vmax:         79.00 cm/s LVOT Vmean:        53.900 cm/s LVOT VTI:          0.141 m LVOT/AV VTI ratio: 0.81  AORTA Ao Root diam: 3.50 cm MITRAL VALVE MV Area (PHT): 5.02 cm     SHUNTS MV Decel Time: 151 msec     Systemic VTI:  0.14 m MV E velocity: 112.00 cm/s  Systemic Diam: 2.00 cm MV A  velocity: 79.10 cm/s MV E/A ratio:  1.42 Marcina Millard MD Electronically signed by Marcina Millard MD Signature Date/Time: 11/14/2022/1:14:40 PM    Final  DG Chest Port 1 View  Result Date: 11/12/2022 CLINICAL DATA:  Shortness of breath EXAM: PORTABLE CHEST 1 VIEW COMPARISON:  Prior chest x-ray 11/09/2021 FINDINGS: Diffuse irregular interstitial prominence with areas of nodularity throughout the lungs. Small bilateral pleural effusions and associated bibasilar atelectasis. Cardiac and mediastinal contours are within normal limits. Atherosclerotic calcifications are visible in the transverse aorta. No pneumothorax. No acute osseous abnormality. Bilateral glenohumeral joint osteoarthritis. IMPRESSION: Diffuse interstitial prominence with areas of nodularity throughout both lungs. Differential considerations include an acute atypical infectious/inflammatory process such as viral pneumonia versus atypical pulmonary edema. Trace bilateral pleural effusions and associated bibasilar atelectasis. Normal heart size. Electronically Signed   By: Malachy Moan M.D.   On: 11/12/2022 13:39   PERIPHERAL VASCULAR CATHETERIZATION  Result Date: 11/11/2022 See surgical note for result.  DG Foot Complete Right  Result Date: 11/10/2022 CLINICAL DATA:  Toe ulcers.  Evaluate for osteomyelitis. EXAM: RIGHT FOOT COMPLETE - 3+ VIEW COMPARISON:  None Available. FINDINGS: There are no signs of acute fracture or dislocation. No focal bone erosions of osteomyelitis identified. Mild hallux valgus deformity with mild degenerative changes at the first MTP joint. Mild dorsal soft tissue edema. IMPRESSION: 1. No acute findings. No signs of osteomyelitis. 2. 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 Electronically Signed   By: Signa Kell M.D.   On: 11/10/2022 10:00    Microbiology: Results for orders placed or performed during the hospital encounter of 11/10/22  Blood Culture (routine x  2)     Status: None   Collection Time: 11/10/22  9:54 AM   Specimen: BLOOD  Result Value Ref Range Status   Specimen Description BLOOD RIGHT ANTECUBITAL  Final   Special Requests   Final    BOTTLES DRAWN AEROBIC AND ANAEROBIC Blood Culture adequate volume   Culture   Final    NO GROWTH 5 DAYS Performed at [facility], [address] [place], [place] [address]    Report Status 11/15/2022 FINAL  Final  Blood Culture (routine x 2)     Status: None   Collection Time: 11/10/22  9:54 AM   Specimen: BLOOD  Result Value Ref Range Status   Specimen Description BLOOD RIGHT ANTECUBITAL  Final   Special Requests   Final    BOTTLES DRAWN AEROBIC AND ANAEROBIC Blood Culture adequate volume   Culture   Final    NO GROWTH 5 DAYS Performed at [facility], [address] [place], [place] [address]    Report Status 11/15/2022 FINAL  Final  Surgical PCR screen     Status: Abnormal   Collection Time: 11/10/22  6:14 PM   Specimen: Nasal Mucosa; Nasal Swab  Result Value Ref Range Status   MRSA, PCR NEGATIVE NEGATIVE Final   Staphylococcus aureus POSITIVE (A) NEGATIVE Final    Comment: (NOTE) The Xpert SA Assay (FDA approved for NASAL specimens in patients 67 years of age and older), is one component of a comprehensive surveillance program. It is not intended to diagnose infection nor to guide or monitor treatment. Performed at [facility], [address] [place], [place] [address]     Labs: CBC: Recent Labs  Lab 11/22/22 0518 11/23/22 1053 11/24/22 0640  WBC 8.7 9.1 8.7  HGB 8.3* 8.9* 8.7*  HCT 24.0* 26.7* 26.1*  MCV 87.6 89.6 90.0  PLT 416* 458* 459*   Basic Metabolic Panel: Recent Labs  Lab 11/22/22 0518 11/23/22 1053 11/24/22 0640 11/25/22 0529 11/26/22 0438  NA  131* 134* 135 136 133*  K 4.6 4.1 3.7 3.8 4.0  CL 97* 100 100 99 97*  CO2 25 24 24 25 28   GLUCOSE 167* 212* 194* 199* 225*  BUN 55* 39* 28* 25* 27*  CREATININE 1.71* 1.18 0.85  1.06 1.13  CALCIUM 8.4* 8.7* 8.5* 8.7* 8.7*  MG  --  2.3  --  2.0  --    Liver Function Tests: No results for input(s): "AST", "ALT", "ALKPHOS", "BILITOT", "PROT", "ALBUMIN" in the last 168 hours. CBG: Recent Labs  Lab 11/25/22 1203 11/25/22 1622 11/25/22 2058 11/26/22 0813 11/26/22 1134  GLUCAP 203* 226* 194* 259* 180*    Discharge time spent: greater than 30 minutes.  Signed: Delfino Lovett, MD [facility] 11/27/2022

## 2022-12-04 ENCOUNTER — Encounter (INDEPENDENT_AMBULATORY_CARE_PROVIDER_SITE_OTHER): Payer: Self-pay | Admitting: Nurse Practitioner

## 2022-12-04 ENCOUNTER — Ambulatory Visit (INDEPENDENT_AMBULATORY_CARE_PROVIDER_SITE_OTHER): Payer: Medicare Other | Admitting: Nurse Practitioner

## 2022-12-04 VITALS — BP 110/63 | HR 65 | Resp 16

## 2022-12-04 DIAGNOSIS — I7025 Atherosclerosis of native arteries of other extremities with ulceration: Secondary | ICD-10-CM

## 2022-12-04 NOTE — Progress Notes (Incomplete)
Subjective:    Patient ID: Manuel [name], male    DOB: Dec 04, 1945, 78 y.o.   MRN: [id number] Chief Complaint  Patient presents with  . Wound Check    2 week follow up    HPI  Review of Systems     Objective:   Physical Exam  BP 110/63 (BP Location: Left Arm)   Pulse 65   Resp 16   Past Medical History:  Diagnosis Date  . Allergies   . Arthritis   . Diabetes mellitus without complication (HCC)   . Gout   . Hard of hearing   . Hyperlipidemia   . Hypertension   . Myocardial infarction [facility]) 2008   treated here at [facility] and transferred to [place] for stent placement  . Neuropathy    knees down  . Peripheral vascular disease (HCC)   . Sleep apnea    "in the past"  . Status post primary angioplasty with coronary stent   . Wears dentures    full upper and lower    Social History   Socioeconomic History  . Marital status: Married    Spouse name: Not on file  . Number of children: Not on file  . Years of education: Not on file  . Highest education level: Not on file  Occupational History  . Not on file  Tobacco Use  . Smoking status: Never  . Smokeless tobacco: Never  Vaping Use  . Vaping status: Never Used  Substance and Sexual Activity  . Alcohol use: Yes    Alcohol/week: 21.0 standard drinks of alcohol    Types: 7 Glasses of wine, 14 Cans of beer per week    Comment: occassional  . Drug use: No  . Sexual activity: Not on file  Other Topics Concern  . Not on file  Social History Narrative  . Not on file   Social Determinants of Health   Financial Resource Strain: Not on file  Food Insecurity: No Food Insecurity (11/10/2022)   Hunger Vital Sign   . Worried About Programme researcher, broadcasting/film/video in the Last Year: Never true   . Ran Out of Food in the Last Year: Never true  Transportation Needs: No Transportation Needs (11/10/2022)   PRAPARE - Transportation   . Lack of Transportation (Medical): No   . Lack of Transportation (Non-Medical): No  Physical  Activity: Not on file  Stress: Not on file  Social Connections: Not on file  Intimate Partner Violence: Not At Risk (11/10/2022)   Humiliation, Afraid, Rape, and Kick questionnaire   . Fear of Current or Ex-Partner: No   . Emotionally Abused: No   . Physically Abused: No   . Sexually Abused: No    Past Surgical History:  Procedure Laterality Date  . 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 CATARACT EXTRACTION W/PHACO Right 03/14/2021   Procedure: CATARACT EXTRACTION PHACO AND INTRAOCULAR LENS PLACEMENT (IOC) RIGHT DIABETIC;  Surgeon: Lockie Mola, MD;  Location: [facility];  Service: Ophthalmology;  Laterality: Right;  Diabetic 16.78 01:39.9  . CATARACT EXTRACTION W/PHACO Left 03/28/2021   Procedure: CATARACT EXTRACTION PHACO AND INTRAOCULAR LENS PLACEMENT (IOC) LEFT DIABETIC 6.93 01:22.0;  Surgeon: Lockie Mola, MD;  Location: [facility];  Service: Ophthalmology;  Laterality: Left;  Diabetic  . ENDARTERECTOMY FEMORAL Bilateral 11/15/2022   Procedure: BILATERAL COMMON FEMORAL  PROFUNDA FEMORIS AND SUPERFICIAL FEMORAL ARTERY ENDARTECTOMIES, RIGHT FOGARTY EMBOLECTOMY OF THE RIGHT SFA  AND  POPLITEAL ARTERIES. AORTAGRAM AND RIGHT LOWER EXTREMITY ANGIOGRAM.;  Surgeon: Annice Needy, MD;  Location: [facility];  Service: Vascular;  Laterality: Bilateral;  . INSERTION OF ILIAC STENT Bilateral 11/15/2022   Procedure: BILATERAL STENT INSERTION IN BILATERAL  COMMON ILIAC ARTERY, STENT INSERTION OF LEFT EXTERNAL ILIAC ARTERY. ANGIOPLASTY RIGHT TIBIAL  AND POPLITEAL ARTERY.;  Surgeon: Annice Needy, MD;  Location: [facility];  Service: Vascular;  Laterality: Bilateral;  . LOWER EXTREMITY ANGIOGRAPHY Right 05/14/2021   Procedure: LOWER EXTREMITY ANGIOGRAPHY;  Surgeon: Annice Needy, MD;  Location: [facility];  Service:  Cardiovascular;  Laterality: Right;  . LOWER EXTREMITY ANGIOGRAPHY Right 06/14/2021   Procedure: Lower Extremity Angiography;  Surgeon: Annice Needy, MD;  Location: [facility];  Service: Cardiovascular;  Laterality: Right;  . LOWER EXTREMITY ANGIOGRAPHY Right 11/11/2022   Procedure: Lower Extremity Angiography;  Surgeon: Annice Needy, MD;  Location: [facility];  Service: Cardiovascular;  Laterality: Right;  . LOWER EXTREMITY INTERVENTION Right 06/15/2021   Procedure: LOWER EXTREMITY INTERVENTION;  Surgeon: Annice Needy, MD;  Location: [facility];  Service: Cardiovascular;  Laterality: Right;  . TONSILLECTOMY     had removed as a child    Family History  Problem Relation Age of Onset  . Scoliosis Mother   . Heart disease Father     No Known Allergies     Latest Ref Rng & Units 11/24/2022    6:40 AM 11/23/2022   10:53 AM 11/22/2022    5:18 AM  CBC  WBC 4.0 - 10.5 K/uL 8.7  9.1  8.7   Hemoglobin 13.0 - 17.0 g/dL 8.7  8.9  8.3   Hematocrit 39.0 - 52.0 % 26.1  26.7  24.0   Platelets 150 - 400 K/uL 459  458  416       CMP     Component Value Date/Time   NA 133 (L) 11/26/2022 0438   NA 137 02/16/2012 2013   K 4.0 11/26/2022 0438   K 3.8 02/16/2012 2013   CL 97 (L) 11/26/2022 0438   CL 102 02/16/2012 2013   CO2 28 11/26/2022 0438   CO2 25 02/16/2012 2013   GLUCOSE 225 (H) 11/26/2022 0438   GLUCOSE 154 (H) 02/16/2012 2013   BUN 27 (H) 11/26/2022 0438   BUN 10 02/16/2012 2013   CREATININE 1.13 11/26/2022 0438   CREATININE 0.72 02/16/2012 2013   CALCIUM 8.7 (L) 11/26/2022 0438   CALCIUM 9.6 02/16/2012 2013   PROT 8.0 11/10/2022 0954   PROT 7.5 02/16/2012 2013   ALBUMIN 4.0 11/10/2022 0954   ALBUMIN 4.4 02/16/2012 2013   AST 27 11/10/2022 0954   AST 52 (H) 02/16/2012 2013   ALT 21 11/10/2022 0954   ALT 101 (H) 02/16/2012 2013   ALKPHOS 70 11/10/2022 0954   ALKPHOS 77 02/16/2012 2013   BILITOT 1.5 (H) 11/10/2022 0954   BILITOT 0.6 02/16/2012 2013    GFRNONAA >60 11/26/2022 0438   GFRNONAA >60 02/16/2012 2013     No results found.     Assessment & Plan:   1. Atherosclerosis of native arteries of the extremities with ulceration (HCC) ***   Current Outpatient Medications on File Prior to Visit  Medication Sig Dispense Refill  . amLODipine (NORVASC) 10 MG tablet Take 10 mg by mouth daily.    Marland Kitchen amoxicillin-clavulanate (AUGMENTIN) 875-125 MG tablet  Take 1 tablet by mouth 2 (two) times daily.    Marland Kitchen aspirin EC 81 MG tablet Take 1 tablet (81 mg total) by mouth daily. 150 tablet 2  . atorvastatin (LIPITOR) 80 MG tablet Take 1 tablet (80 mg total) by mouth daily. 90 tablet 1  . carvedilol (COREG) 12.5 MG tablet Take 1 tablet by mouth 2 (two) times daily.    . clopidogrel (PLAVIX) 75 MG tablet Take 1 tablet (75 mg total) by mouth daily. 90 tablet 1  . diphenhydrAMINE (BENADRYL) 25 mg capsule Take 25 mg by mouth every 6 (six) hours as needed.    . Ensure Max Protein (ENSURE MAX PROTEIN) LIQD Take 330 mLs (11 oz total) by mouth 2 (two) times daily. 10000 mL 3  . gabapentin (NEURONTIN) 400 MG capsule Take 800 mg by mouth 2 (two) times daily.    . insulin aspart protamine- aspart (NOVOLOG MIX 70/30) (70-30) 100 UNIT/ML injection Inject 30 Units into the skin 2 (two) times daily with a meal.    . levocetirizine (XYZAL) 5 MG tablet Take 5 mg by mouth every evening.    Marland Kitchen losartan (COZAAR) 25 MG tablet Take 3 tablets (75 mg total) by mouth daily. 30 tablet 0  . Melatonin 1 MG CAPS at bedtime as needed.    . senna-docusate (SENOKOT-S) 8.6-50 MG tablet Take 1 tablet by mouth at bedtime as needed for mild constipation. 30 tablet 0  . spironolactone (ALDACTONE) 50 MG tablet Take 1 tablet (50 mg total) by mouth daily. 30 tablet 0  . torsemide (DEMADEX) 20 MG tablet Take 1 tablet (20 mg total) by mouth daily. 30 tablet 0  . polyethylene glycol (MIRALAX / GLYCOLAX) 17 g packet Take 17 g by mouth daily. (Patient not taking: Reported on 08/13/2022) 14 each  0   No current facility-administered medications on file prior to visit.    There are no Patient Instructions on file for this visit. No follow-ups on file.   Georgiana Spinner, NP

## 2022-12-05 NOTE — Progress Notes (Signed)
Subjective:    Patient ID: FUTURE MONRROY, male    DOB: 1945-08-27, 77 y.o.   MRN: [id number] Chief Complaint  Patient presents with   Wound Check    2 week follow up    Manuel [name] is a 77 year old male who underwent intervention on 11/15/2022 including:  PROCEDURE: 1.   Left common femoral, profunda femoris, and superficial femoral artery endarterectomies 2.   Right common femoral, profunda femoris, and superficial femoral artery endarterectomies 3.   Fogarty embolectomy of the right SFA and popliteal arteries with a 3 and 4 Fogarty embolectomy balloon 4.   Aortogram and right lower extremity angiogram 5.   Kissing balloon expandable stent placements in bilateral common iliac arteries with 12 mm diameter by 58 mm length Lifestream stents postdilated with 14 mm balloons 6.   Additional stent placement x 2 to the left external iliac artery with a 14 mm diameter by 6 cm length life star stent and 13 mm diameter by 5 cm length Viabahn stent 7.   Angioplasty of the right posterior tibial artery with 3 mm diameter by 10 cm length stent 8.   Angioplasty of the right popliteal artery with 6 mm diameter by 15 cm length Lutonix drug-coated angioplasty balloon  He presents for staple removal.  The wounds bilaterally are primarily intact without any evidence of dehiscence except for small area and his groin fold on the right lower extremity.  He also has more notable swelling in the right.  He notes that he has had some scrotal and penis swelling but that is also improved.  He notes that he was recently started on antibiotics by his podiatrist for infection in his foot.  He is doing well with pain control.     Review of Systems  Cardiovascular:  Positive for leg swelling.  Musculoskeletal:  Positive for gait problem.  Skin:  Positive for wound.  All other systems reviewed and are negative.      Objective:   Physical Exam Vitals reviewed.  HENT:     Head: Normocephalic.   Cardiovascular:     Rate and Rhythm: Normal rate.  Pulmonary:     Effort: Pulmonary effort is normal.  Musculoskeletal:     Right lower leg: Edema present.  Skin:    General: Skin is warm and dry.  Neurological:     Mental Status: He is alert and oriented to person, place, and time.  Psychiatric:        Mood and Affect: Mood normal.        Behavior: Behavior normal.        Thought Content: Thought content normal.        Judgment: Judgment normal.     BP 110/63 (BP Location: Left Arm)   Pulse 65   Resp 16   Past Medical History:  Diagnosis Date   Allergies    Arthritis    Diabetes mellitus without complication (HCC)    Gout    Hard of hearing    Hyperlipidemia    Hypertension    Myocardial infarction [facility]) 2008   treated here at [facility] and transferred to [place] for stent placement   Neuropathy    knees down   Peripheral vascular disease (HCC)    Sleep apnea    "in the past"   Status post primary angioplasty with coronary stent    Wears dentures    full upper and lower    Social History   Socioeconomic History   Marital  status: Married    Spouse name: Not on file   Number of children: Not on file   Years of education: Not on file   Highest education level: Not on file  Occupational History   Not on file  Tobacco Use   Smoking status: Never   Smokeless tobacco: Never  Vaping Use   Vaping status: Never Used  Substance and Sexual Activity   Alcohol use: Yes    Alcohol/week: 21.0 standard drinks of alcohol    Types: 7 Glasses of wine, 14 Cans of beer per week    Comment: occassional   Drug use: No   Sexual activity: Not on file  Other Topics Concern   Not on file  Social History Narrative   Not on file   Social Determinants of Health   Financial Resource Strain: Not on file  Food Insecurity: No Food Insecurity (11/10/2022)   Hunger Vital Sign    Worried About Running Out of Food in the Last Year: Never true    Ran Out of Food in the Last Year:  Never true  Transportation Needs: No Transportation Needs (11/10/2022)   PRAPARE - Administrator, Civil Service (Medical): No    Lack of Transportation (Non-Medical): No  Physical Activity: Not on file  Stress: Not on file  Social Connections: Not on file  Intimate Partner Violence: Not At Risk (11/10/2022)   Humiliation, Afraid, Rape, and Kick questionnaire    Fear of Current or Ex-Partner: No    Emotionally Abused: No    Physically Abused: No    Sexually Abused: No    Past Surgical History:  Procedure Laterality Date   AMPUTATION Right 11/18/2022   Procedure: 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;  Service: Ophthalmology;  Laterality: Right;  Diabetic 16.78 01:39.9   CATARACT EXTRACTION W/PHACO Left 03/28/2021   Procedure: CATARACT EXTRACTION PHACO AND INTRAOCULAR LENS PLACEMENT (IOC) LEFT DIABETIC 6.93 01:22.0;  Surgeon: Lockie Mola, MD;  Location: [facility];  Service: Ophthalmology;  Laterality: Left;  Diabetic   ENDARTERECTOMY FEMORAL Bilateral 11/15/2022   Procedure: BILATERAL COMMON FEMORAL PROFUNDA FEMORIS AND SUPERFICIAL FEMORAL ARTERY ENDARTECTOMIES, RIGHT FOGARTY EMBOLECTOMY OF THE RIGHT SFA  AND  POPLITEAL ARTERIES. AORTAGRAM AND RIGHT LOWER EXTREMITY ANGIOGRAM.;  Surgeon: Annice Needy, MD;  Location: [facility];  Service: Vascular;  Laterality: Bilateral;   INSERTION OF ILIAC STENT Bilateral 11/15/2022   Procedure: BILATERAL STENT INSERTION IN BILATERAL  COMMON ILIAC ARTERY, STENT INSERTION OF LEFT EXTERNAL ILIAC ARTERY. ANGIOPLASTY RIGHT TIBIAL  AND POPLITEAL ARTERY.;  Surgeon: Annice Needy, MD;  Location: [facility];  Service: Vascular;   Laterality: Bilateral;   LOWER EXTREMITY ANGIOGRAPHY Right 05/14/2021   Procedure: LOWER EXTREMITY ANGIOGRAPHY;  Surgeon: Annice Needy, MD;  Location: [facility];  Service: Cardiovascular;  Laterality: Right;   LOWER EXTREMITY ANGIOGRAPHY Right 06/14/2021   Procedure: Lower Extremity Angiography;  Surgeon: Annice Needy, MD;  Location: [facility];  Service: Cardiovascular;  Laterality: Right;   LOWER EXTREMITY ANGIOGRAPHY Right 11/11/2022   Procedure: Lower Extremity Angiography;  Surgeon: Annice Needy, MD;  Location: [facility];  Service: Cardiovascular;  Laterality: Right;   LOWER EXTREMITY INTERVENTION  Right 06/15/2021   Procedure: LOWER EXTREMITY INTERVENTION;  Surgeon: Annice Needy, MD;  Location: [facility];  Service: Cardiovascular;  Laterality: Right;   TONSILLECTOMY     had removed as a child    Family History  Problem Relation Age of Onset   Scoliosis Mother    Heart disease Father     No Known Allergies     Latest Ref Rng & Units 11/24/2022    6:40 AM 11/23/2022   10:53 AM 11/22/2022    5:18 AM  CBC  WBC 4.0 - 10.5 K/uL 8.7  9.1  8.7   Hemoglobin 13.0 - 17.0 g/dL 8.7  8.9  8.3   Hematocrit 39.0 - 52.0 % 26.1  26.7  24.0   Platelets 150 - 400 K/uL 459  458  416       CMP     Component Value Date/Time   NA 133 (L) 11/26/2022 0438   NA 137 02/16/2012 2013   K 4.0 11/26/2022 0438   K 3.8 02/16/2012 2013   CL 97 (L) 11/26/2022 0438   CL 102 02/16/2012 2013   CO2 28 11/26/2022 0438   CO2 25 02/16/2012 2013   GLUCOSE 225 (H) 11/26/2022 0438   GLUCOSE 154 (H) 02/16/2012 2013   BUN 27 (H) 11/26/2022 0438   BUN 10 02/16/2012 2013   CREATININE 1.13 11/26/2022 0438   CREATININE 0.72 02/16/2012 2013   CALCIUM 8.7 (L) 11/26/2022 0438   CALCIUM 9.6 02/16/2012 2013   PROT 8.0 11/10/2022 0954   PROT 7.5 02/16/2012 2013   ALBUMIN 4.0 11/10/2022 0954   ALBUMIN 4.4 02/16/2012 2013   AST 27 11/10/2022 0954   AST 52 (H) 02/16/2012 2013    ALT 21 11/10/2022 0954   ALT 101 (H) 02/16/2012 2013   ALKPHOS 70 11/10/2022 0954   ALKPHOS 77 02/16/2012 2013   BILITOT 1.5 (H) 11/10/2022 0954   BILITOT 0.6 02/16/2012 2013   GFRNONAA >60 11/26/2022 0438   GFRNONAA >60 02/16/2012 2013     No results found.     Assessment & Plan:   1. Atherosclerosis of native arteries of the extremities with ulceration (HCC) The patient's bilateral groin sites are doing fairly well.  There is a small amount of dehiscence in the patient's right groin site.  Will place Medihoney over the area and the patient's been instructed to change this daily.  Steri-Strips were applied on the rest of the wound bilaterally.  He currently has a wound on his right foot and orbit that is being followed by podiatry.  He does have some extensive swelling in the right lower extremity.  There is still concern for possible cellulitis however he was recently started on Augmentin and so we will not send in antibiotics today however will have him return in 1 week for further staple removal.  The patient does note that he is currently doing well with pain management and does not require pain medication at this time.   Current Outpatient Medications on File Prior to Visit  Medication Sig Dispense Refill   amLODipine (NORVASC) 10 MG tablet Take 10 mg by mouth daily.     amoxicillin-clavulanate (AUGMENTIN) 875-125 MG tablet Take 1 tablet by mouth 2 (two) times daily.     aspirin EC 81 MG tablet Take 1 tablet (81 mg total) by mouth daily. 150 tablet 2   atorvastatin (LIPITOR) 80 MG tablet Take 1 tablet (80 mg total) by mouth daily. 90 tablet 1   carvedilol (  COREG) 12.5 MG tablet Take 1 tablet by mouth 2 (two) times daily.     clopidogrel (PLAVIX) 75 MG tablet Take 1 tablet (75 mg total) by mouth daily. 90 tablet 1   diphenhydrAMINE (BENADRYL) 25 mg capsule Take 25 mg by mouth every 6 (six) hours as needed.     Ensure Max Protein (ENSURE MAX PROTEIN) LIQD Take 330 mLs (11 oz total)  by mouth 2 (two) times daily. 10000 mL 3   gabapentin (NEURONTIN) 400 MG capsule Take 800 mg by mouth 2 (two) times daily.     insulin aspart protamine- aspart (NOVOLOG MIX 70/30) (70-30) 100 UNIT/ML injection Inject 30 Units into the skin 2 (two) times daily with a meal.     levocetirizine (XYZAL) 5 MG tablet Take 5 mg by mouth every evening.     losartan (COZAAR) 25 MG tablet Take 3 tablets (75 mg total) by mouth daily. 30 tablet 0   Melatonin 1 MG CAPS at bedtime as needed.     senna-docusate (SENOKOT-S) 8.6-50 MG tablet Take 1 tablet by mouth at bedtime as needed for mild constipation. 30 tablet 0   spironolactone (ALDACTONE) 50 MG tablet Take 1 tablet (50 mg total) by mouth daily. 30 tablet 0   torsemide (DEMADEX) 20 MG tablet Take 1 tablet (20 mg total) by mouth daily. 30 tablet 0   polyethylene glycol (MIRALAX / GLYCOLAX) 17 g packet Take 17 g by mouth daily. (Patient not taking: Reported on 08/13/2022) 14 each 0   No current facility-administered medications on file prior to visit.    There are no Patient Instructions on file for this visit. No follow-ups on file.   Georgiana Spinner, NP

## 2022-12-11 ENCOUNTER — Telehealth (INDEPENDENT_AMBULATORY_CARE_PROVIDER_SITE_OTHER): Payer: Self-pay

## 2022-12-11 NOTE — Telephone Encounter (Signed)
Mandy from [facility] reach out to informed that patient groin incision has quarter size open area and yeast infection in the area. Patient is schedule to come in tomorrow.

## 2022-12-12 ENCOUNTER — Ambulatory Visit (INDEPENDENT_AMBULATORY_CARE_PROVIDER_SITE_OTHER): Payer: Medicare Other | Admitting: Nurse Practitioner

## 2022-12-12 ENCOUNTER — Encounter (INDEPENDENT_AMBULATORY_CARE_PROVIDER_SITE_OTHER): Payer: Self-pay | Admitting: Nurse Practitioner

## 2022-12-12 VITALS — BP 144/72 | HR 71 | Resp 20 | Ht 68.0 in | Wt 200.0 lb

## 2022-12-12 DIAGNOSIS — I7025 Atherosclerosis of native arteries of other extremities with ulceration: Secondary | ICD-10-CM

## 2022-12-12 MED ORDER — NYSTATIN 100000 UNIT/GM EX POWD: 1.0000 | Freq: Three times a day (TID) | CUTANEOUS | 5 refills | Status: DC

## 2022-12-12 NOTE — Progress Notes (Incomplete)
Subjective:    Patient ID: Manuel [name], male    DOB: Jan 23, 1946, 77 y.o.   MRN: [id number] Chief Complaint  Patient presents with  . Follow-up    1 week follow up staple removale    Manuel [name] is a 77 year old male who underwent intervention on 11/15/2022 including:  PROCEDURE: 1.   Left common femoral, profunda femoris, and superficial femoral artery endarterectomies 2.   Right common femoral, profunda femoris, and superficial femoral artery endarterectomies 3.   Fogarty embolectomy of the right SFA and popliteal arteries with a 3 and 4 Fogarty embolectomy balloon 4.   Aortogram and right lower extremity angiogram 5.   Kissing balloon expandable stent placements in bilateral common iliac arteries with 12 mm diameter by 58 mm length Lifestream stents postdilated with 14 mm balloons 6.   Additional stent placement x 2 to the left external iliac artery with a 14 mm diameter by 6 cm length life star stent and 13 mm diameter by 5 cm length Viabahn stent 7.   Angioplasty of the right posterior tibial artery with 3 mm diameter by 10 cm length stent 8.   Angioplasty of the right popliteal artery with 6 mm diameter by 15 cm length Lutonix drug-coated angioplasty balloon  The patient returns today for further staple removal.  The wounds in the left groin are primarily intact however there is a small area of dehiscence in the right.  From the patient he has not been using Medihoney he should be covering the area with gauze, unlike previously prescribed.  She has been very well following her lower arm and following ongoing for follow-up from his left arm sling.   He presents for staple removal.  The wounds bilaterally are primarily intact without any evidence of dehiscence except for small area and his groin fold on the right lower extremity.  He also has more notable swelling in the right.  He notes that he has had some scrotal and penis swelling but that is also improved.  He notes that he  was recently started on antibiotics by his podiatrist for infection in his foot.  He is doing well with pain control.     Review of Systems  Cardiovascular:  Positive for leg swelling.  Musculoskeletal:  Positive for gait problem.  Skin:  Positive for wound.  All other systems reviewed and are negative.      Objective:   Physical Exam Vitals reviewed.  HENT:     Head: Normocephalic.  Cardiovascular:     Rate and Rhythm: Normal rate.  Pulmonary:     Effort: Pulmonary effort is normal.  Musculoskeletal:     Right lower leg: Edema present.  Skin:    General: Skin is warm and dry.  Neurological:     Mental Status: He is alert and oriented to person, place, and time.  Psychiatric:        Mood and Affect: Mood normal.        Behavior: Behavior normal.        Thought Content: Thought content normal.        Judgment: Judgment normal.     BP (!) 144/72 (BP Location: Left Arm)   Pulse 71   Resp 20   Ht 5\' 8"  (1.727 m)   Wt 200 lb (90.7 kg)   BMI 30.41 kg/m   Past Medical History:  Diagnosis Date  . Allergies   . Arthritis   . Diabetes mellitus without complication (HCC)   .  Gout   . Hard of hearing   . Hyperlipidemia   . Hypertension   . Myocardial infarction [facility]) 2008   treated here at [facility] and transferred to [place] for stent placement  . Neuropathy    knees down  . Peripheral vascular disease (HCC)   . Sleep apnea    "in the past"  . Status post primary angioplasty with coronary stent   . Wears dentures    full upper and lower    Social History   Socioeconomic History  . Marital status: Married    Spouse name: Not on file  . Number of children: Not on file  . Years of education: Not on file  . Highest education level: Not on file  Occupational History  . Not on file  Tobacco Use  . Smoking status: Never  . Smokeless tobacco: Never  Vaping Use  . Vaping status: Never Used  Substance and Sexual Activity  . Alcohol use: Yes    Alcohol/week: 21.0  standard drinks of alcohol    Types: 7 Glasses of wine, 14 Cans of beer per week    Comment: occassional  . Drug use: No  . Sexual activity: Not on file  Other Topics Concern  . Not on file  Social History Narrative  . Not on file   Social Determinants of Health   Financial Resource Strain: Not on file  Food Insecurity: No Food Insecurity (11/10/2022)   Hunger Vital Sign   . Worried About Programme researcher, broadcasting/film/video in the Last Year: Never true   . Ran Out of Food in the Last Year: Never true  Transportation Needs: No Transportation Needs (11/10/2022)   PRAPARE - Transportation   . Lack of Transportation (Medical): No   . Lack of Transportation (Non-Medical): No  Physical Activity: Not on file  Stress: Not on file  Social Connections: Not on file  Intimate Partner Violence: Not At Risk (11/10/2022)   Humiliation, Afraid, Rape, and Kick questionnaire   . Fear of Current or Ex-Partner: No   . Emotionally Abused: No   . Physically Abused: No   . Sexually Abused: No    Past Surgical History:  Procedure Laterality Date  . 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 CATARACT EXTRACTION W/PHACO Right 03/14/2021   Procedure: CATARACT EXTRACTION PHACO AND INTRAOCULAR LENS PLACEMENT (IOC) RIGHT DIABETIC;  Surgeon: Lockie Mola, MD;  Location: [facility];  Service: Ophthalmology;  Laterality: Right;  Diabetic 16.78 01:39.9  . CATARACT EXTRACTION W/PHACO Left 03/28/2021   Procedure: CATARACT EXTRACTION PHACO AND INTRAOCULAR LENS PLACEMENT (IOC) LEFT DIABETIC 6.93 01:22.0;  Surgeon: Lockie Mola, MD;  Location: [facility];  Service: Ophthalmology;  Laterality: Left;  Diabetic  . ENDARTERECTOMY FEMORAL Bilateral 11/15/2022   Procedure: BILATERAL COMMON FEMORAL PROFUNDA FEMORIS AND SUPERFICIAL FEMORAL ARTERY ENDARTECTOMIES,  RIGHT FOGARTY EMBOLECTOMY OF THE RIGHT SFA  AND  POPLITEAL ARTERIES. AORTAGRAM AND RIGHT LOWER EXTREMITY ANGIOGRAM.;  Surgeon: Annice Needy, MD;  Location: [facility];  Service: Vascular;  Laterality: Bilateral;  . INSERTION OF ILIAC STENT Bilateral 11/15/2022   Procedure: BILATERAL STENT INSERTION IN BILATERAL  COMMON ILIAC ARTERY, STENT INSERTION OF LEFT EXTERNAL ILIAC ARTERY. ANGIOPLASTY RIGHT TIBIAL  AND POPLITEAL ARTERY.;  Surgeon: Annice Needy, MD;  Location: [facility];  Service: Vascular;  Laterality: Bilateral;  . LOWER EXTREMITY ANGIOGRAPHY  Right 05/14/2021   Procedure: LOWER EXTREMITY ANGIOGRAPHY;  Surgeon: Annice Needy, MD;  Location: [facility];  Service: Cardiovascular;  Laterality: Right;  . LOWER EXTREMITY ANGIOGRAPHY Right 06/14/2021   Procedure: Lower Extremity Angiography;  Surgeon: Annice Needy, MD;  Location: [facility];  Service: Cardiovascular;  Laterality: Right;  . LOWER EXTREMITY ANGIOGRAPHY Right 11/11/2022   Procedure: Lower Extremity Angiography;  Surgeon: Annice Needy, MD;  Location: [facility];  Service: Cardiovascular;  Laterality: Right;  . LOWER EXTREMITY INTERVENTION Right 06/15/2021   Procedure: LOWER EXTREMITY INTERVENTION;  Surgeon: Annice Needy, MD;  Location: [facility];  Service: Cardiovascular;  Laterality: Right;  . TONSILLECTOMY     had removed as a child    Family History  Problem Relation Age of Onset  . Scoliosis Mother   . Heart disease Father     No Known Allergies     Latest Ref Rng & Units 11/24/2022    6:40 AM 11/23/2022   10:53 AM 11/22/2022    5:18 AM  CBC  WBC 4.0 - 10.5 K/uL 8.7  9.1  8.7   Hemoglobin 13.0 - 17.0 g/dL 8.7  8.9  8.3   Hematocrit 39.0 - 52.0 % 26.1  26.7  24.0   Platelets 150 - 400 K/uL 459  458  416       CMP     Component Value Date/Time   NA 133 (L) 11/26/2022 0438   NA 137 02/16/2012 2013   K 4.0 11/26/2022 0438   K 3.8 02/16/2012 2013   CL 97 (L) 11/26/2022 0438   CL 102  02/16/2012 2013   CO2 28 11/26/2022 0438   CO2 25 02/16/2012 2013   GLUCOSE 225 (H) 11/26/2022 0438   GLUCOSE 154 (H) 02/16/2012 2013   BUN 27 (H) 11/26/2022 0438   BUN 10 02/16/2012 2013   CREATININE 1.13 11/26/2022 0438   CREATININE 0.72 02/16/2012 2013   CALCIUM 8.7 (L) 11/26/2022 0438   CALCIUM 9.6 02/16/2012 2013   PROT 8.0 11/10/2022 0954   PROT 7.5 02/16/2012 2013   ALBUMIN 4.0 11/10/2022 0954   ALBUMIN 4.4 02/16/2012 2013   AST 27 11/10/2022 0954   AST 52 (H) 02/16/2012 2013   ALT 21 11/10/2022 0954   ALT 101 (H) 02/16/2012 2013   ALKPHOS 70 11/10/2022 0954   ALKPHOS 77 02/16/2012 2013   BILITOT 1.5 (H) 11/10/2022 0954   BILITOT 0.6 02/16/2012 2013   GFRNONAA >60 11/26/2022 0438   GFRNONAA >60 02/16/2012 2013     No results found.     Assessment & Plan:   1. Atherosclerosis of native arteries of the extremities with ulceration (HCC) The patient's bilateral groin sites are doing fairly well.  There is a small amount of dehiscence in the patient's right groin site.  Will place Medihoney over the area and the patient's been instructed to change this daily.  Steri-Strips were applied on the rest of the wound bilaterally.  He currently has a wound on his right foot and orbit that is being followed by podiatry.  He does have some extensive swelling in the right lower extremity.  There is still concern for possible cellulitis however he was recently started on Augmentin and so we will not send in antibiotics today however will have him return in 1 week for further staple removal.  The patient does note that he is currently doing well with pain management and does not require pain  medication at this time.   Current Outpatient Medications on File Prior to Visit  Medication Sig Dispense Refill  . amLODipine (NORVASC) 10 MG tablet Take 10 mg by mouth daily.    Marland Kitchen amoxicillin-clavulanate (AUGMENTIN) 875-125 MG tablet Take 1 tablet by mouth 2 (two) times daily.    Marland Kitchen aspirin EC 81  MG tablet Take 1 tablet (81 mg total) by mouth daily. 150 tablet 2  . atorvastatin (LIPITOR) 80 MG tablet Take 1 tablet (80 mg total) by mouth daily. 90 tablet 1  . carvedilol (COREG) 12.5 MG tablet Take 1 tablet by mouth 2 (two) times daily.    . clopidogrel (PLAVIX) 75 MG tablet Take 1 tablet (75 mg total) by mouth daily. 90 tablet 1  . diphenhydrAMINE (BENADRYL) 25 mg capsule Take 25 mg by mouth every 6 (six) hours as needed.    . Ensure Max Protein (ENSURE MAX PROTEIN) LIQD Take 330 mLs (11 oz total) by mouth 2 (two) times daily. 10000 mL 3  . gabapentin (NEURONTIN) 400 MG capsule Take 800 mg by mouth 2 (two) times daily.    . insulin aspart protamine- aspart (NOVOLOG MIX 70/30) (70-30) 100 UNIT/ML injection Inject 30 Units into the skin 2 (two) times daily with a meal.    . levocetirizine (XYZAL) 5 MG tablet Take 5 mg by mouth every evening.    Marland Kitchen losartan (COZAAR) 25 MG tablet Take 3 tablets (75 mg total) by mouth daily. 30 tablet 0  . Melatonin 1 MG CAPS at bedtime as needed.    . polyethylene glycol (MIRALAX / GLYCOLAX) 17 g packet Take 17 g by mouth daily. (Patient not taking: Reported on 08/13/2022) 14 each 0  . senna-docusate (SENOKOT-S) 8.6-50 MG tablet Take 1 tablet by mouth at bedtime as needed for mild constipation. 30 tablet 0  . spironolactone (ALDACTONE) 50 MG tablet Take 1 tablet (50 mg total) by mouth daily. 30 tablet 0  . torsemide (DEMADEX) 20 MG tablet Take 1 tablet (20 mg total) by mouth daily. 30 tablet 0   No current facility-administered medications on file prior to visit.    There are no Patient Instructions on file for this visit. No follow-ups on file.   Georgiana Spinner, NP

## 2022-12-12 NOTE — Progress Notes (Unsigned)
Subjective:    Patient ID: Manuel [name], male    DOB: 1946-01-07, 77 y.o.   MRN: [id number] Chief Complaint  Patient presents with  . Follow-up    1 week follow up staple removale    Manuel [name] is a 77 year old male who underwent intervention on 11/15/2022 including:  PROCEDURE: 1.   Left common femoral, profunda femoris, and superficial femoral artery endarterectomies 2.   Right common femoral, profunda femoris, and superficial femoral artery endarterectomies 3.   Fogarty embolectomy of the right SFA and popliteal arteries with a 3 and 4 Fogarty embolectomy balloon 4.   Aortogram and right lower extremity angiogram 5.   Kissing balloon expandable stent placements in bilateral common iliac arteries with 12 mm diameter by 58 mm length Lifestream stents postdilated with 14 mm balloons 6.   Additional stent placement x 2 to the left external iliac artery with a 14 mm diameter by 6 cm length life star stent and 13 mm diameter by 5 cm length Viabahn stent 7.   Angioplasty of the right posterior tibial artery with 3 mm diameter by 10 cm length stent 8.   Angioplasty of the right popliteal artery with 6 mm diameter by 15 cm length Lutonix drug-coated angioplasty balloon  The patient returns today for further staple removal.  The wounds in the left groin are primarily intact however there is a small area of dehiscence in the right.  From the patient he has not been using Medihoney he should be covering the area with gauze, unlike previously prescribed.  She has been very well following her lower arm and following ongoing for follow-up from his left arm sling.  Bilateral foot findings.  No longer.  Problem   He presents for staple removal.  The wounds bilaterally are primarily intact without any evidence of dehiscence except for small area and his groin fold on the right lower extremity.  He also has more notable swelling in the right.  He notes that he has had some scrotal and penis  swelling but that is also improved.  He notes that he was recently started on antibiotics by his podiatrist for infection in his foot.  He is doing well with pain control.    Review of Systems  Cardiovascular:  Positive for leg swelling.  Musculoskeletal:  Positive for gait problem.  Skin:  Positive for wound.  All other systems reviewed and are negative.      Objective:   Physical Exam Vitals reviewed.  HENT:     Head: Normocephalic.  Cardiovascular:     Rate and Rhythm: Normal rate.  Pulmonary:     Effort: Pulmonary effort is normal.  Musculoskeletal:     Right lower leg: Edema present.  Skin:    General: Skin is warm and dry.  Neurological:     Mental Status: He is alert and oriented to person, place, and time.  Psychiatric:        Mood and Affect: Mood normal.        Behavior: Behavior normal.        Thought Content: Thought content normal.        Judgment: Judgment normal.    BP (!) 144/72 (BP Location: Left Arm)   Pulse 71   Resp 20   Ht 5\' 8"  (1.727 m)   Wt 200 lb (90.7 kg)   BMI 30.41 kg/m   Past Medical History:  Diagnosis Date  . Allergies   . Arthritis   . Diabetes  mellitus without complication (HCC)   . Gout   . Hard of hearing   . Hyperlipidemia   . Hypertension   . Myocardial infarction [facility]) 2008   treated here at [facility] and transferred to [place] for stent placement  . Neuropathy    knees down  . Peripheral vascular disease (HCC)   . Sleep apnea    "in the past"  . Status post primary angioplasty with coronary stent   . Wears dentures    full upper and lower    Social History   Socioeconomic History  . Marital status: Married    Spouse name: Not on file  . Number of children: Not on file  . Years of education: Not on file  . Highest education level: Not on file  Occupational History  . Not on file  Tobacco Use  . Smoking status: Never  . Smokeless tobacco: Never  Vaping Use  . Vaping status: Never Used  Substance and Sexual  Activity  . Alcohol use: Yes    Alcohol/week: 21.0 standard drinks of alcohol    Types: 7 Glasses of wine, 14 Cans of beer per week    Comment: occassional  . Drug use: No  . Sexual activity: Not on file  Other Topics Concern  . Not on file  Social History Narrative  . Not on file   Social Determinants of Health   Financial Resource Strain: Not on file  Food Insecurity: No Food Insecurity (11/10/2022)   Hunger Vital Sign   . Worried About Programme researcher, broadcasting/film/video in the Last Year: Never true   . Ran Out of Food in the Last Year: Never true  Transportation Needs: No Transportation Needs (11/10/2022)   PRAPARE - Transportation   . Lack of Transportation (Medical): No   . Lack of Transportation (Non-Medical): No  Physical Activity: Not on file  Stress: Not on file  Social Connections: Not on file  Intimate Partner Violence: Not At Risk (11/10/2022)   Humiliation, Afraid, Rape, and Kick questionnaire   . Fear of Current or Ex-Partner: No   . Emotionally Abused: No   . Physically Abused: No   . Sexually Abused: No    Past Surgical History:  Procedure Laterality Date  . 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 CATARACT EXTRACTION W/PHACO Right 03/14/2021   Procedure: CATARACT EXTRACTION PHACO AND INTRAOCULAR LENS PLACEMENT (IOC) RIGHT DIABETIC;  Surgeon: Lockie Mola, MD;  Location: [facility];  Service: Ophthalmology;  Laterality: Right;  Diabetic 16.78 01:39.9  . CATARACT EXTRACTION W/PHACO Left 03/28/2021   Procedure: CATARACT EXTRACTION PHACO AND INTRAOCULAR LENS PLACEMENT (IOC) LEFT DIABETIC 6.93 01:22.0;  Surgeon: Lockie Mola, MD;  Location: [facility];  Service: Ophthalmology;  Laterality: Left;  Diabetic  . ENDARTERECTOMY FEMORAL Bilateral 11/15/2022   Procedure: BILATERAL COMMON FEMORAL PROFUNDA  FEMORIS AND SUPERFICIAL FEMORAL ARTERY ENDARTECTOMIES, RIGHT FOGARTY EMBOLECTOMY OF THE RIGHT SFA  AND  POPLITEAL ARTERIES. AORTAGRAM AND RIGHT LOWER EXTREMITY ANGIOGRAM.;  Surgeon: Annice Needy, MD;  Location: [facility];  Service: Vascular;  Laterality: Bilateral;  . INSERTION OF ILIAC STENT Bilateral 11/15/2022   Procedure: BILATERAL STENT INSERTION IN BILATERAL  COMMON ILIAC ARTERY, STENT INSERTION OF LEFT EXTERNAL ILIAC ARTERY. ANGIOPLASTY RIGHT TIBIAL  AND POPLITEAL ARTERY.;  Surgeon: Annice Needy, MD;  Location: [facility];  Service: Vascular;  Laterality: Bilateral;  . LOWER EXTREMITY ANGIOGRAPHY Right 05/14/2021   Procedure: LOWER EXTREMITY ANGIOGRAPHY;  Surgeon: Annice Needy, MD;  Location: [facility];  Service: Cardiovascular;  Laterality: Right;  . LOWER EXTREMITY ANGIOGRAPHY Right 06/14/2021   Procedure: Lower Extremity Angiography;  Surgeon: Annice Needy, MD;  Location: [facility];  Service: Cardiovascular;  Laterality: Right;  . LOWER EXTREMITY ANGIOGRAPHY Right 11/11/2022   Procedure: Lower Extremity Angiography;  Surgeon: Annice Needy, MD;  Location: [facility];  Service: Cardiovascular;  Laterality: Right;  . LOWER EXTREMITY INTERVENTION Right 06/15/2021   Procedure: LOWER EXTREMITY INTERVENTION;  Surgeon: Annice Needy, MD;  Location: [facility];  Service: Cardiovascular;  Laterality: Right;  . TONSILLECTOMY     had removed as a child    Family History  Problem Relation Age of Onset  . Scoliosis Mother   . Heart disease Father     No Known Allergies     Latest Ref Rng & Units 11/24/2022    6:40 AM 11/23/2022   10:53 AM 11/22/2022    5:18 AM  CBC  WBC 4.0 - 10.5 K/uL 8.7  9.1  8.7   Hemoglobin 13.0 - 17.0 g/dL 8.7  8.9  8.3   Hematocrit 39.0 - 52.0 % 26.1  26.7  24.0   Platelets 150 - 400 K/uL 459  458  416       CMP     Component Value Date/Time   NA 133 (L) 11/26/2022 0438   NA 137 02/16/2012 2013   K 4.0 11/26/2022 0438   K 3.8  02/16/2012 2013   CL 97 (L) 11/26/2022 0438   CL 102 02/16/2012 2013   CO2 28 11/26/2022 0438   CO2 25 02/16/2012 2013   GLUCOSE 225 (H) 11/26/2022 0438   GLUCOSE 154 (H) 02/16/2012 2013   BUN 27 (H) 11/26/2022 0438   BUN 10 02/16/2012 2013   CREATININE 1.13 11/26/2022 0438   CREATININE 0.72 02/16/2012 2013   CALCIUM 8.7 (L) 11/26/2022 0438   CALCIUM 9.6 02/16/2012 2013   PROT 8.0 11/10/2022 0954   PROT 7.5 02/16/2012 2013   ALBUMIN 4.0 11/10/2022 0954   ALBUMIN 4.4 02/16/2012 2013   AST 27 11/10/2022 0954   AST 52 (H) 02/16/2012 2013   ALT 21 11/10/2022 0954   ALT 101 (H) 02/16/2012 2013   ALKPHOS 70 11/10/2022 0954   ALKPHOS 77 02/16/2012 2013   BILITOT 1.5 (H) 11/10/2022 0954   BILITOT 0.6 02/16/2012 2013   GFRNONAA >60 11/26/2022 0438   GFRNONAA >60 02/16/2012 2013     No results found.     Assessment & Plan:   1. Atherosclerosis of native arteries of the extremities with ulceration (HCC) The patient's bilateral groin sites are doing fairly well.  There is a small amount of dehiscence in the patient's right groin site.  Will place Medihoney over the area and the patient's been instructed to change this daily.  Steri-Strips were applied on the rest of the wound bilaterally.  He currently has a wound on his right foot and orbit that is being followed by podiatry.  He does have some extensive swelling in the right lower extremity.  There is still concern for possible cellulitis however he was recently started on Augmentin and so we will not send in antibiotics today however will have him return in 1 week for further staple removal.  The patient does note that he is currently doing well with  pain management and does not require pain medication at this time.   Current Outpatient Medications on File Prior to Visit  Medication Sig Dispense Refill  . amLODipine (NORVASC) 10 MG tablet Take 10 mg by mouth daily.    Marland Kitchen amoxicillin-clavulanate (AUGMENTIN) 875-125 MG tablet Take 1  tablet by mouth 2 (two) times daily.    Marland Kitchen aspirin EC 81 MG tablet Take 1 tablet (81 mg total) by mouth daily. 150 tablet 2  . atorvastatin (LIPITOR) 80 MG tablet Take 1 tablet (80 mg total) by mouth daily. 90 tablet 1  . carvedilol (COREG) 12.5 MG tablet Take 1 tablet by mouth 2 (two) times daily.    . clopidogrel (PLAVIX) 75 MG tablet Take 1 tablet (75 mg total) by mouth daily. 90 tablet 1  . diphenhydrAMINE (BENADRYL) 25 mg capsule Take 25 mg by mouth every 6 (six) hours as needed.    . Ensure Max Protein (ENSURE MAX PROTEIN) LIQD Take 330 mLs (11 oz total) by mouth 2 (two) times daily. 10000 mL 3  . gabapentin (NEURONTIN) 400 MG capsule Take 800 mg by mouth 2 (two) times daily.    . insulin aspart protamine- aspart (NOVOLOG MIX 70/30) (70-30) 100 UNIT/ML injection Inject 30 Units into the skin 2 (two) times daily with a meal.    . levocetirizine (XYZAL) 5 MG tablet Take 5 mg by mouth every evening.    Marland Kitchen losartan (COZAAR) 25 MG tablet Take 3 tablets (75 mg total) by mouth daily. 30 tablet 0  . Melatonin 1 MG CAPS at bedtime as needed.    . polyethylene glycol (MIRALAX / GLYCOLAX) 17 g packet Take 17 g by mouth daily. (Patient not taking: Reported on 08/13/2022) 14 each 0  . senna-docusate (SENOKOT-S) 8.6-50 MG tablet Take 1 tablet by mouth at bedtime as needed for mild constipation. 30 tablet 0  . spironolactone (ALDACTONE) 50 MG tablet Take 1 tablet (50 mg total) by mouth daily. 30 tablet 0  . torsemide (DEMADEX) 20 MG tablet Take 1 tablet (20 mg total) by mouth daily. 30 tablet 0   No current facility-administered medications on file prior to visit.    There are no Patient Instructions on file for this visit. No follow-ups on file.   Georgiana Spinner, NP

## 2022-12-27 ENCOUNTER — Telehealth (INDEPENDENT_AMBULATORY_CARE_PROVIDER_SITE_OTHER): Payer: Self-pay

## 2022-12-27 NOTE — Telephone Encounter (Signed)
Patient left a message stating that he is having right leg swelling that has been going on for few weeks. I left a message for the patient to return to the office.

## 2022-12-30 ENCOUNTER — Telehealth (INDEPENDENT_AMBULATORY_CARE_PROVIDER_SITE_OTHER): Payer: Self-pay

## 2022-12-30 NOTE — Telephone Encounter (Signed)
Patient called stating his right leg is swollen and and in pain. He would like for someone to look at it Patient is requesting an appt.   Please advise

## 2022-12-30 NOTE — Telephone Encounter (Signed)
He has an appointment with studies on 9/18, we can try to move it up

## 2022-12-31 NOTE — Telephone Encounter (Signed)
Appt made for tomorrow.

## 2023-01-01 ENCOUNTER — Ambulatory Visit (INDEPENDENT_AMBULATORY_CARE_PROVIDER_SITE_OTHER): Payer: Medicare Other

## 2023-01-01 ENCOUNTER — Other Ambulatory Visit (INDEPENDENT_AMBULATORY_CARE_PROVIDER_SITE_OTHER): Payer: Self-pay | Admitting: Nurse Practitioner

## 2023-01-01 ENCOUNTER — Encounter (INDEPENDENT_AMBULATORY_CARE_PROVIDER_SITE_OTHER): Payer: Self-pay | Admitting: Nurse Practitioner

## 2023-01-01 ENCOUNTER — Ambulatory Visit (INDEPENDENT_AMBULATORY_CARE_PROVIDER_SITE_OTHER): Payer: Medicare Other | Admitting: Nurse Practitioner

## 2023-01-01 VITALS — BP 150/80 | HR 80 | Resp 18 | Ht 68.0 in | Wt 194.6 lb

## 2023-01-01 DIAGNOSIS — M7989 Other specified soft tissue disorders: Secondary | ICD-10-CM

## 2023-01-01 DIAGNOSIS — I7025 Atherosclerosis of native arteries of other extremities with ulceration: Secondary | ICD-10-CM

## 2023-01-01 DIAGNOSIS — T8130XA Disruption of wound, unspecified, initial encounter: Secondary | ICD-10-CM

## 2023-01-01 NOTE — Progress Notes (Signed)
Subjective:    Patient ID: Manuel [name], male    DOB: 22-Feb-1946, 77 y.o.   MRN: [id number] Chief Complaint  Patient presents with   Follow-up    3 week follow up with ABI & wound check    Patient is a 77 year old male who returns today for follow-up studies following his recent revascularization on 11/15/2022 including:  PROCEDURE: 1.   Left common femoral, profunda femoris, and superficial femoral artery endarterectomies 2.   Right common femoral, profunda femoris, and superficial femoral artery endarterectomies 3.   Fogarty embolectomy of the right SFA and popliteal arteries with a 3 and 4 Fogarty embolectomy balloon 4.   Aortogram and right lower extremity angiogram 5.   Kissing balloon expandable stent placements in bilateral common iliac arteries with 12 mm diameter by 58 mm length Lifestream stents postdilated with 14 mm balloons 6.   Additional stent placement x 2 to the left external iliac artery with a 14 mm diameter by 6 cm length life star stent and 13 mm diameter by 5 cm length Viabahn stent 7.   Angioplasty of the right posterior tibial artery with 3 mm diameter by 10 cm length stent 8.   Angioplasty of the right popliteal artery with 6 mm diameter by 15 cm length Lutonix drug-coated angioplasty balloon  He follows up sooner due to swelling and pain.  Unfortunately the patient is a somewhat poor historian he is unable to exactly specify the source of his discomfort.  He is concerned about the overall swelling of the leg itself in addition to some scrotal swelling as well.  He notes that the swelling is uncomfortable for him.  He recently visited his cardiologist and medication changes were made unfortunately the patient was not aware of any medication changes discussed with him at this visit but it was noted that his medications were picked up from pharmacy.  He notes that the right groin wound is dehisced and it has an open wound area measuring approximately 4 cm x 5 cm  with a depth of 1 cm however there is about 2.5 cm of tunneling.  The patient currently follows a diabetic diet.  Today his noninvasive studies and ABI of 0.67 on the right and 0.82 on the left.  The previous ABI was 0.81 on the right and 0.90 on the left.  Additional studies today show no evidence of DVT noted in the right lower extremity.  There does not appear to be a fluid collection in both groins consistent with likely seroma as no flow seen within either of these areas.  The stent in the right SFA is noted to be patent.    Review of Systems  Cardiovascular:  Positive for leg swelling.  Skin:  Positive for wound.  All other systems reviewed and are negative.      Objective:   Physical Exam Vitals reviewed.  HENT:     Head: Normocephalic.  Cardiovascular:     Rate and Rhythm: Normal rate.     Pulses:          Dorsalis pedis pulses are detected w/ Doppler on the right side and detected w/ Doppler on the left side.       Posterior tibial pulses are detected w/ Doppler on the right side and detected w/ Doppler on the left side.  Pulmonary:     Effort: Pulmonary effort is normal.  Skin:    General: Skin is warm and dry.  Neurological:     Mental  Status: He is alert and oriented to person, place, and time.  Psychiatric:        Mood and Affect: Mood normal.        Behavior: Behavior normal.        Thought Content: Thought content normal.        Judgment: Judgment normal.     BP (!) 150/80 (BP Location: Left Arm)   Pulse 80   Resp 18   Ht 5\' 8"  (1.727 m)   Wt 194 lb 9.6 oz (88.3 kg)   BMI 29.59 kg/m   Past Medical History:  Diagnosis Date   Allergies    Arthritis    Diabetes mellitus without complication (HCC)    Gout    Hard of hearing    Hyperlipidemia    Hypertension    Myocardial infarction [facility]) 2008   treated here at [facility] and transferred to [place] for stent placement   Neuropathy    knees down   Peripheral vascular disease (HCC)    Sleep apnea    "in  the past"   Status post primary angioplasty with coronary stent    Wears dentures    full upper and lower    Social History   Socioeconomic History   Marital status: Married    Spouse name: Not on file   Number of children: Not on file   Years of education: Not on file   Highest education level: Not on file  Occupational History   Not on file  Tobacco Use   Smoking status: Never   Smokeless tobacco: Never  Vaping Use   Vaping status: Never Used  Substance and Sexual Activity   Alcohol use: Yes    Alcohol/week: 21.0 standard drinks of alcohol    Types: 7 Glasses of wine, 14 Cans of beer per week    Comment: occassional   Drug use: No   Sexual activity: Not on file  Other Topics Concern   Not on file  Social History Narrative   Not on file   Social Determinants of Health   Financial Resource Strain: Not on file  Food Insecurity: No Food Insecurity (11/10/2022)   Hunger Vital Sign    Worried About Running Out of Food in the Last Year: Never true    Ran Out of Food in the Last Year: Never true  Transportation Needs: No Transportation Needs (11/10/2022)   PRAPARE - Administrator, Civil Service (Medical): No    Lack of Transportation (Non-Medical): No  Physical Activity: Not on file  Stress: Not on file  Social Connections: Not on file  Intimate Partner Violence: Not At Risk (11/10/2022)   Humiliation, Afraid, Rape, and Kick questionnaire    Fear of Current or Ex-Partner: No    Emotionally Abused: No    Physically Abused: No    Sexually Abused: No    Past Surgical History:  Procedure Laterality Date   AMPUTATION Right 11/18/2022   Procedure: AMPUTATION 4TH AND 5TH RAY;  Surgeon: Linus Galas, [name];  Location: [facility];  Service: Orthopedics/Podiatry;  Laterality: Right;  4th and 5th toe   CATARACT EXTRACTION W/PHACO Right 03/14/2021   Procedure: CATARACT EXTRACTION PHACO AND INTRAOCULAR LENS PLACEMENT (IOC) RIGHT DIABETIC;  Surgeon: Lockie Mola,  MD;  Location: [facility];  Service: Ophthalmology;  Laterality: Right;  Diabetic 16.78 01:39.9   CATARACT EXTRACTION W/PHACO Left 03/28/2021   Procedure: CATARACT EXTRACTION PHACO AND INTRAOCULAR LENS PLACEMENT (IOC) LEFT DIABETIC 6.93 01:22.0;  Surgeon: Inez Pilgrim,  Radene Knee, MD;  Location: [facility];  Service: Ophthalmology;  Laterality: Left;  Diabetic   ENDARTERECTOMY FEMORAL Bilateral 11/15/2022   Procedure: BILATERAL COMMON FEMORAL PROFUNDA FEMORIS AND SUPERFICIAL FEMORAL ARTERY ENDARTECTOMIES, RIGHT FOGARTY EMBOLECTOMY OF THE RIGHT SFA  AND  POPLITEAL ARTERIES. AORTAGRAM AND RIGHT LOWER EXTREMITY ANGIOGRAM.;  Surgeon: Annice Needy, MD;  Location: [facility];  Service: Vascular;  Laterality: Bilateral;   INSERTION OF ILIAC STENT Bilateral 11/15/2022   Procedure: BILATERAL STENT INSERTION IN BILATERAL  COMMON ILIAC ARTERY, STENT INSERTION OF LEFT EXTERNAL ILIAC ARTERY. ANGIOPLASTY RIGHT TIBIAL  AND POPLITEAL ARTERY.;  Surgeon: Annice Needy, MD;  Location: [facility];  Service: Vascular;  Laterality: Bilateral;   LOWER EXTREMITY ANGIOGRAPHY Right 05/14/2021   Procedure: LOWER EXTREMITY ANGIOGRAPHY;  Surgeon: Annice Needy, MD;  Location: [facility];  Service: Cardiovascular;  Laterality: Right;   LOWER EXTREMITY ANGIOGRAPHY Right 06/14/2021   Procedure: Lower Extremity Angiography;  Surgeon: Annice Needy, MD;  Location: [facility];  Service: Cardiovascular;  Laterality: Right;   LOWER EXTREMITY ANGIOGRAPHY Right 11/11/2022   Procedure: Lower Extremity Angiography;  Surgeon: Annice Needy, MD;  Location: [facility];  Service: Cardiovascular;  Laterality: Right;   LOWER EXTREMITY INTERVENTION Right 06/15/2021   Procedure: LOWER EXTREMITY INTERVENTION;  Surgeon: Annice Needy, MD;  Location: [facility];  Service: Cardiovascular;  Laterality: Right;   TONSILLECTOMY     had removed as a child    Family History  Problem Relation Age of Onset    Scoliosis Mother    Heart disease Father     No Known Allergies     Latest Ref Rng & Units 11/24/2022    6:40 AM 11/23/2022   10:53 AM 11/22/2022    5:18 AM  CBC  WBC 4.0 - 10.5 K/uL 8.7  9.1  8.7   Hemoglobin 13.0 - 17.0 g/dL 8.7  8.9  8.3   Hematocrit 39.0 - 52.0 % 26.1  26.7  24.0   Platelets 150 - 400 K/uL 459  458  416       CMP     Component Value Date/Time   NA 133 (L) 11/26/2022 0438   NA 137 02/16/2012 2013   K 4.0 11/26/2022 0438   K 3.8 02/16/2012 2013   CL 97 (L) 11/26/2022 0438   CL 102 02/16/2012 2013   CO2 28 11/26/2022 0438   CO2 25 02/16/2012 2013   GLUCOSE 225 (H) 11/26/2022 0438   GLUCOSE 154 (H) 02/16/2012 2013   BUN 27 (H) 11/26/2022 0438   BUN 10 02/16/2012 2013   CREATININE 1.13 11/26/2022 0438   CREATININE 0.72 02/16/2012 2013   CALCIUM 8.7 (L) 11/26/2022 0438   CALCIUM 9.6 02/16/2012 2013   PROT 8.0 11/10/2022 0954   PROT 7.5 02/16/2012 2013   ALBUMIN 4.0 11/10/2022 0954   ALBUMIN 4.4 02/16/2012 2013   AST 27 11/10/2022 0954   AST 52 (H) 02/16/2012 2013   ALT 21 11/10/2022 0954   ALT 101 (H) 02/16/2012 2013   ALKPHOS 70 11/10/2022 0954   ALKPHOS 77 02/16/2012 2013   BILITOT 1.5 (H) 11/10/2022 0954   BILITOT 0.6 02/16/2012 2013   GFRNONAA >60 11/26/2022 0438   GFRNONAA >60 02/16/2012 2013     No results found.     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  Additional studies today have not noted any significant things in fact studies note that the stent in the right SFA is currently patent as are the femoral and iliac vessels.  The patient does have hematoma and there is a bilateral femoral vein.  Certainly this could be some compression causing some diminished flow.  He is describing what is sounding like claudication symptoms but given his wound we would be able to intervene currently.  Based on  this the patient is advised to continue with his current medications once his wounds heal we can likely reevaluate his perfusion with additional angiogram.  2. Leg swelling Typically the patient's following extensive surgery for critical limb ischemia will have issues with reperfusion swelling.  I additionally think that it may be further complicated by the patient's heart failure.  He recently had several medication changes in order to better along with guideline directed medical therapy however the patient has not started any other recommendations and in fact his medicines and his understanding of the medicines were completely wrong today.  Once the patient was reoriented to the directions given by his cardiologist, we will see if by optimizing his medications this will help with swelling.  In addition the patient is advised to utilize medical grade compression in order to help the swelling with elevation.  3. Wound dehiscence Today the patient's right groin wound is open and tunneling.  Given that he has had recent endarterectomy and graft repair my largest concern is that this tunneling could worsen and ultimately resulted in a graft infection especially in the setting of the seroma he has surrounding the area.  Based on this we will plan on having the patient undergo debridement with wound vacuum placement.  The patient's left groin does have area of fibrinous exudate we will continue topical treatment with Medihoney.   Current Outpatient Medications on File Prior to Visit  Medication Sig Dispense Refill   amLODipine (NORVASC) 10 MG tablet Take 10 mg by mouth daily.     aspirin EC 81 MG tablet Take 1 tablet (81 mg total) by mouth daily. 150 tablet 2   atorvastatin (LIPITOR) 80 MG tablet Take 1 tablet (80 mg total) by mouth daily. 90 tablet 1   carvedilol (COREG) 12.5 MG tablet Take 1 tablet by mouth 2 (two) times daily.     clopidogrel (PLAVIX) 75 MG tablet Take 1 tablet (75 mg total) by mouth  daily. 90 tablet 1   diphenhydrAMINE (BENADRYL) 25 mg capsule Take 25 mg by mouth every 6 (six) hours as needed.     Ensure Max Protein (ENSURE MAX PROTEIN) LIQD Take 330 mLs (11 oz total) by mouth 2 (two) times daily. 10000 mL 3   gabapentin (NEURONTIN) 400 MG capsule Take 800 mg by mouth 2 (two) times daily.     insulin aspart protamine- aspart (NOVOLOG MIX 70/30) (70-30) 100 UNIT/ML injection Inject 30 Units into the skin 2 (two) times daily with a meal.     levocetirizine (XYZAL) 5 MG tablet Take 5 mg by mouth every evening.     losartan (COZAAR) 25 MG tablet Take 3 tablets (75 mg total) by mouth daily. 30 tablet 0   Melatonin 1 MG CAPS at bedtime as needed.     nystatin (MYCOSTATIN/NYSTOP) powder Apply 1 Application topically 3 (three) times daily. 30 g 5   senna-docusate (SENOKOT-S) 8.6-50 MG tablet Take 1 tablet by mouth at bedtime as needed for mild constipation. 30 tablet 0   spironolactone (ALDACTONE) 50 MG  tablet Take 1 tablet (50 mg total) by mouth daily. 30 tablet 0   torsemide (DEMADEX) 20 MG tablet Take 1 tablet (20 mg total) by mouth daily. 30 tablet 0   polyethylene glycol (MIRALAX / GLYCOLAX) 17 g packet Take 17 g by mouth daily. (Patient not taking: Reported on 08/13/2022) 14 each 0   No current facility-administered medications on file prior to visit.    There are no Patient Instructions on file for this visit. No follow-ups on file.   Georgiana Spinner, NP

## 2023-01-01 NOTE — H&P (View-Only) (Signed)
Subjective:    Patient ID: Manuel [name], male    DOB: 1945/12/07, 77 y.o.   MRN: [id number] Chief Complaint  Patient presents with   Follow-up    3 week follow up with ABI & wound check    Patient is a 77 year old male who returns today for follow-up studies following his recent revascularization on 11/15/2022 including:  PROCEDURE: 1.   Left common femoral, profunda femoris, and superficial femoral artery endarterectomies 2.   Right common femoral, profunda femoris, and superficial femoral artery endarterectomies 3.   Fogarty embolectomy of the right SFA and popliteal arteries with a 3 and 4 Fogarty embolectomy balloon 4.   Aortogram and right lower extremity angiogram 5.   Kissing balloon expandable stent placements in bilateral common iliac arteries with 12 mm diameter by 58 mm length Lifestream stents postdilated with 14 mm balloons 6.   Additional stent placement x 2 to the left external iliac artery with a 14 mm diameter by 6 cm length life star stent and 13 mm diameter by 5 cm length Viabahn stent 7.   Angioplasty of the right posterior tibial artery with 3 mm diameter by 10 cm length stent 8.   Angioplasty of the right popliteal artery with 6 mm diameter by 15 cm length Lutonix drug-coated angioplasty balloon  He follows up sooner due to swelling and pain.  Unfortunately the patient is a somewhat poor historian he is unable to exactly specify the source of his discomfort.  He is concerned about the overall swelling of the leg itself in addition to some scrotal swelling as well.  He notes that the swelling is uncomfortable for him.  He recently visited his cardiologist and medication changes were made unfortunately the patient was not aware of any medication changes discussed with him at this visit but it was noted that his medications were picked up from pharmacy.  He notes that the right groin wound is dehisced and it has an open wound area measuring approximately 4 cm x 5 cm  with a depth of 1 cm however there is about 2.5 cm of tunneling.  The patient currently follows a diabetic diet.  Today his noninvasive studies and ABI of 0.67 on the right and 0.82 on the left.  The previous ABI was 0.81 on the right and 0.90 on the left.  Additional studies today show no evidence of DVT noted in the right lower extremity.  There does not appear to be a fluid collection in both groins consistent with likely seroma as no flow seen within either of these areas.  The stent in the right SFA is noted to be patent.    Review of Systems  Cardiovascular:  Positive for leg swelling.  Skin:  Positive for wound.  All other systems reviewed and are negative.      Objective:   Physical Exam Vitals reviewed.  HENT:     Head: Normocephalic.  Cardiovascular:     Rate and Rhythm: Normal rate.     Pulses:          Dorsalis pedis pulses are detected w/ Doppler on the right side and detected w/ Doppler on the left side.       Posterior tibial pulses are detected w/ Doppler on the right side and detected w/ Doppler on the left side.  Pulmonary:     Effort: Pulmonary effort is normal.  Skin:    General: Skin is warm and dry.  Neurological:     Mental  Status: He is alert and oriented to person, place, and time.  Psychiatric:        Mood and Affect: Mood normal.        Behavior: Behavior normal.        Thought Content: Thought content normal.        Judgment: Judgment normal.     BP (!) 150/80 (BP Location: Left Arm)   Pulse 80   Resp 18   Ht 5\' 8"  (1.727 m)   Wt 194 lb 9.6 oz (88.3 kg)   BMI 29.59 kg/m   Past Medical History:  Diagnosis Date   Allergies    Arthritis    Diabetes mellitus without complication (HCC)    Gout    Hard of hearing    Hyperlipidemia    Hypertension    Myocardial infarction [facility]) 2008   treated here at [facility] and transferred to [place] for stent placement   Neuropathy    knees down   Peripheral vascular disease (HCC)    Sleep apnea    "in  the past"   Status post primary angioplasty with coronary stent    Wears dentures    full upper and lower    Social History   Socioeconomic History   Marital status: Married    Spouse name: Not on file   Number of children: Not on file   Years of education: Not on file   Highest education level: Not on file  Occupational History   Not on file  Tobacco Use   Smoking status: Never   Smokeless tobacco: Never  Vaping Use   Vaping status: Never Used  Substance and Sexual Activity   Alcohol use: Yes    Alcohol/week: 21.0 standard drinks of alcohol    Types: 7 Glasses of wine, 14 Cans of beer per week    Comment: occassional   Drug use: No   Sexual activity: Not on file  Other Topics Concern   Not on file  Social History Narrative   Not on file   Social Determinants of Health   Financial Resource Strain: Not on file  Food Insecurity: No Food Insecurity (11/10/2022)   Hunger Vital Sign    Worried About Running Out of Food in the Last Year: Never true    Ran Out of Food in the Last Year: Never true  Transportation Needs: No Transportation Needs (11/10/2022)   PRAPARE - Administrator, Civil Service (Medical): No    Lack of Transportation (Non-Medical): No  Physical Activity: Not on file  Stress: Not on file  Social Connections: Not on file  Intimate Partner Violence: Not At Risk (11/10/2022)   Humiliation, Afraid, Rape, and Kick questionnaire    Fear of Current or Ex-Partner: No    Emotionally Abused: No    Physically Abused: No    Sexually Abused: No    Past Surgical History:  Procedure Laterality Date   AMPUTATION Right 11/18/2022   Procedure: AMPUTATION 4TH AND 5TH RAY;  Surgeon: Linus Galas, [name];  Location: [facility];  Service: Orthopedics/Podiatry;  Laterality: Right;  4th and 5th toe   CATARACT EXTRACTION W/PHACO Right 03/14/2021   Procedure: CATARACT EXTRACTION PHACO AND INTRAOCULAR LENS PLACEMENT (IOC) RIGHT DIABETIC;  Surgeon: Lockie Mola,  MD;  Location: [facility];  Service: Ophthalmology;  Laterality: Right;  Diabetic 16.78 01:39.9   CATARACT EXTRACTION W/PHACO Left 03/28/2021   Procedure: CATARACT EXTRACTION PHACO AND INTRAOCULAR LENS PLACEMENT (IOC) LEFT DIABETIC 6.93 01:22.0;  Surgeon: Inez Pilgrim,  Radene Knee, MD;  Location: [facility];  Service: Ophthalmology;  Laterality: Left;  Diabetic   ENDARTERECTOMY FEMORAL Bilateral 11/15/2022   Procedure: BILATERAL COMMON FEMORAL PROFUNDA FEMORIS AND SUPERFICIAL FEMORAL ARTERY ENDARTECTOMIES, RIGHT FOGARTY EMBOLECTOMY OF THE RIGHT SFA  AND  POPLITEAL ARTERIES. AORTAGRAM AND RIGHT LOWER EXTREMITY ANGIOGRAM.;  Surgeon: Annice Needy, MD;  Location: [facility];  Service: Vascular;  Laterality: Bilateral;   INSERTION OF ILIAC STENT Bilateral 11/15/2022   Procedure: BILATERAL STENT INSERTION IN BILATERAL  COMMON ILIAC ARTERY, STENT INSERTION OF LEFT EXTERNAL ILIAC ARTERY. ANGIOPLASTY RIGHT TIBIAL  AND POPLITEAL ARTERY.;  Surgeon: Annice Needy, MD;  Location: [facility];  Service: Vascular;  Laterality: Bilateral;   LOWER EXTREMITY ANGIOGRAPHY Right 05/14/2021   Procedure: LOWER EXTREMITY ANGIOGRAPHY;  Surgeon: Annice Needy, MD;  Location: [facility];  Service: Cardiovascular;  Laterality: Right;   LOWER EXTREMITY ANGIOGRAPHY Right 06/14/2021   Procedure: Lower Extremity Angiography;  Surgeon: Annice Needy, MD;  Location: [facility];  Service: Cardiovascular;  Laterality: Right;   LOWER EXTREMITY ANGIOGRAPHY Right 11/11/2022   Procedure: Lower Extremity Angiography;  Surgeon: Annice Needy, MD;  Location: [facility];  Service: Cardiovascular;  Laterality: Right;   LOWER EXTREMITY INTERVENTION Right 06/15/2021   Procedure: LOWER EXTREMITY INTERVENTION;  Surgeon: Annice Needy, MD;  Location: [facility];  Service: Cardiovascular;  Laterality: Right;   TONSILLECTOMY     had removed as a child    Family History  Problem Relation Age of Onset    Scoliosis Mother    Heart disease Father     No Known Allergies     Latest Ref Rng & Units 11/24/2022    6:40 AM 11/23/2022   10:53 AM 11/22/2022    5:18 AM  CBC  WBC 4.0 - 10.5 K/uL 8.7  9.1  8.7   Hemoglobin 13.0 - 17.0 g/dL 8.7  8.9  8.3   Hematocrit 39.0 - 52.0 % 26.1  26.7  24.0   Platelets 150 - 400 K/uL 459  458  416       CMP     Component Value Date/Time   NA 133 (L) 11/26/2022 0438   NA 137 02/16/2012 2013   K 4.0 11/26/2022 0438   K 3.8 02/16/2012 2013   CL 97 (L) 11/26/2022 0438   CL 102 02/16/2012 2013   CO2 28 11/26/2022 0438   CO2 25 02/16/2012 2013   GLUCOSE 225 (H) 11/26/2022 0438   GLUCOSE 154 (H) 02/16/2012 2013   BUN 27 (H) 11/26/2022 0438   BUN 10 02/16/2012 2013   CREATININE 1.13 11/26/2022 0438   CREATININE 0.72 02/16/2012 2013   CALCIUM 8.7 (L) 11/26/2022 0438   CALCIUM 9.6 02/16/2012 2013   PROT 8.0 11/10/2022 0954   PROT 7.5 02/16/2012 2013   ALBUMIN 4.0 11/10/2022 0954   ALBUMIN 4.4 02/16/2012 2013   AST 27 11/10/2022 0954   AST 52 (H) 02/16/2012 2013   ALT 21 11/10/2022 0954   ALT 101 (H) 02/16/2012 2013   ALKPHOS 70 11/10/2022 0954   ALKPHOS 77 02/16/2012 2013   BILITOT 1.5 (H) 11/10/2022 0954   BILITOT 0.6 02/16/2012 2013   GFRNONAA >60 11/26/2022 0438   GFRNONAA >60 02/16/2012 2013     No results found.     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  Additional studies today have not noted any significant things in fact studies note that the stent in the right SFA is currently patent as are the femoral and iliac vessels.  The patient does have hematoma and there is a bilateral femoral vein.  Certainly this could be some compression causing some diminished flow.  He is describing what is sounding like claudication symptoms but given his wound we would be able to intervene currently.  Based on  this the patient is advised to continue with his current medications once his wounds heal we can likely reevaluate his perfusion with additional angiogram.  2. Leg swelling Typically the patient's following extensive surgery for critical limb ischemia will have issues with reperfusion swelling.  I additionally think that it may be further complicated by the patient's heart failure.  He recently had several medication changes in order to better along with guideline directed medical therapy however the patient has not started any other recommendations and in fact his medicines and his understanding of the medicines were completely wrong today.  Once the patient was reoriented to the directions given by his cardiologist, we will see if by optimizing his medications this will help with swelling.  In addition the patient is advised to utilize medical grade compression in order to help the swelling with elevation.  3. Wound dehiscence Today the patient's right groin wound is open and tunneling.  Given that he has had recent endarterectomy and graft repair my largest concern is that this tunneling could worsen and ultimately resulted in a graft infection especially in the setting of the seroma he has surrounding the area.  Based on this we will plan on having the patient undergo debridement with wound vacuum placement.  The patient's left groin does have area of fibrinous exudate we will continue topical treatment with Medihoney.   Current Outpatient Medications on File Prior to Visit  Medication Sig Dispense Refill   amLODipine (NORVASC) 10 MG tablet Take 10 mg by mouth daily.     aspirin EC 81 MG tablet Take 1 tablet (81 mg total) by mouth daily. 150 tablet 2   atorvastatin (LIPITOR) 80 MG tablet Take 1 tablet (80 mg total) by mouth daily. 90 tablet 1   carvedilol (COREG) 12.5 MG tablet Take 1 tablet by mouth 2 (two) times daily.     clopidogrel (PLAVIX) 75 MG tablet Take 1 tablet (75 mg total) by mouth  daily. 90 tablet 1   diphenhydrAMINE (BENADRYL) 25 mg capsule Take 25 mg by mouth every 6 (six) hours as needed.     Ensure Max Protein (ENSURE MAX PROTEIN) LIQD Take 330 mLs (11 oz total) by mouth 2 (two) times daily. 10000 mL 3   gabapentin (NEURONTIN) 400 MG capsule Take 800 mg by mouth 2 (two) times daily.     insulin aspart protamine- aspart (NOVOLOG MIX 70/30) (70-30) 100 UNIT/ML injection Inject 30 Units into the skin 2 (two) times daily with a meal.     levocetirizine (XYZAL) 5 MG tablet Take 5 mg by mouth every evening.     losartan (COZAAR) 25 MG tablet Take 3 tablets (75 mg total) by mouth daily. 30 tablet 0   Melatonin 1 MG CAPS at bedtime as needed.     nystatin (MYCOSTATIN/NYSTOP) powder Apply 1 Application topically 3 (three) times daily. 30 g 5   senna-docusate (SENOKOT-S) 8.6-50 MG tablet Take 1 tablet by mouth at bedtime as needed for mild constipation. 30 tablet 0   spironolactone (ALDACTONE) 50 MG  tablet Take 1 tablet (50 mg total) by mouth daily. 30 tablet 0   torsemide (DEMADEX) 20 MG tablet Take 1 tablet (20 mg total) by mouth daily. 30 tablet 0   polyethylene glycol (MIRALAX / GLYCOLAX) 17 g packet Take 17 g by mouth daily. (Patient not taking: Reported on 08/13/2022) 14 each 0   No current facility-administered medications on file prior to visit.    There are no Patient Instructions on file for this visit. No follow-ups on file.   Georgiana Spinner, NP

## 2023-01-02 ENCOUNTER — Telehealth (INDEPENDENT_AMBULATORY_CARE_PROVIDER_SITE_OTHER): Payer: Self-pay

## 2023-01-02 LAB — VAS US ABI WITH/WO TBI
Left ABI: 0.82
Right ABI: 0.67

## 2023-01-02 NOTE — Telephone Encounter (Signed)
Spoke with the patient and he is scheduled with Dr. Wyn Quaker for a right groin wound debridement and vac placement on 01/09/23 at [facility]. Pre-op is on 01/07/23 at 8:00 am at the [facility]. Pre-surgical instructions were discussed and will be mailed. Wound vac ordered through [facility] with [facility].

## 2023-01-07 ENCOUNTER — Encounter
Admission: RE | Admit: 2023-01-07 | Discharge: 2023-01-07 | Disposition: A | Discharge status: Home / Self Care | Payer: Medicare Other | Source: Ambulatory Visit | Attending: Vascular Surgery | Admitting: Vascular Surgery

## 2023-01-07 ENCOUNTER — Other Ambulatory Visit (INDEPENDENT_AMBULATORY_CARE_PROVIDER_SITE_OTHER): Payer: Self-pay | Admitting: Nurse Practitioner

## 2023-01-07 ENCOUNTER — Telehealth (INDEPENDENT_AMBULATORY_CARE_PROVIDER_SITE_OTHER): Payer: Self-pay

## 2023-01-07 ENCOUNTER — Encounter (INDEPENDENT_AMBULATORY_CARE_PROVIDER_SITE_OTHER): Payer: Self-pay | Admitting: Nurse Practitioner

## 2023-01-07 ENCOUNTER — Other Ambulatory Visit: Payer: Self-pay

## 2023-01-07 VITALS — BP 156/73 | HR 73 | Temp 97.7°F | Resp 17 | Ht 68.0 in | Wt 187.0 lb

## 2023-01-07 DIAGNOSIS — I10 Essential (primary) hypertension: Secondary | ICD-10-CM

## 2023-01-07 DIAGNOSIS — E782 Mixed hyperlipidemia: Secondary | ICD-10-CM | POA: Diagnosis not present

## 2023-01-07 DIAGNOSIS — E119 Type 2 diabetes mellitus without complications: Secondary | ICD-10-CM | POA: Diagnosis not present

## 2023-01-07 DIAGNOSIS — Z794 Long term (current) use of insulin: Secondary | ICD-10-CM | POA: Diagnosis not present

## 2023-01-07 DIAGNOSIS — Y828 Other medical devices associated with adverse incidents: Secondary | ICD-10-CM | POA: Diagnosis not present

## 2023-01-07 DIAGNOSIS — T8130XA Disruption of wound, unspecified, initial encounter: Secondary | ICD-10-CM | POA: Diagnosis not present

## 2023-01-07 DIAGNOSIS — I2510 Atherosclerotic heart disease of native coronary artery without angina pectoris: Secondary | ICD-10-CM | POA: Diagnosis not present

## 2023-01-07 DIAGNOSIS — I5043 Acute on chronic combined systolic (congestive) and diastolic (congestive) heart failure: Secondary | ICD-10-CM | POA: Insufficient documentation

## 2023-01-07 DIAGNOSIS — I252 Old myocardial infarction: Secondary | ICD-10-CM | POA: Diagnosis not present

## 2023-01-07 DIAGNOSIS — I110 Hypertensive heart disease with heart failure: Secondary | ICD-10-CM | POA: Diagnosis not present

## 2023-01-07 DIAGNOSIS — Z01812 Encounter for preprocedural laboratory examination: Secondary | ICD-10-CM | POA: Diagnosis present

## 2023-01-07 HISTORY — DX: Essential (primary) hypertension: I10

## 2023-01-07 HISTORY — DX: Cellulitis of unspecified part of limb: L03.119

## 2023-01-07 HISTORY — DX: Type 2 diabetes mellitus without complications: E11.9

## 2023-01-07 HISTORY — DX: Diverticulosis of intestine, part unspecified, without perforation or abscess without bleeding: K57.90

## 2023-01-07 HISTORY — DX: Other intervertebral disc degeneration, lumbar region: M51.36

## 2023-01-07 HISTORY — DX: Type 2 diabetes mellitus without complications: Z79.4

## 2023-01-07 HISTORY — DX: Long term (current) use of aspirin: Z79.82

## 2023-01-07 HISTORY — DX: Long term (current) use of antithrombotics/antiplatelets: Z79.02

## 2023-01-07 HISTORY — DX: Other intervertebral disc degeneration, lumbar region without mention of lumbar back pain or lower extremity pain: M51.369

## 2023-01-07 HISTORY — DX: Radiculopathy, lumbar region: M54.16

## 2023-01-07 HISTORY — DX: Peripheral vascular disease, unspecified: I73.9

## 2023-01-07 LAB — BASIC METABOLIC PANEL
Anion gap: 12 (ref 5–15)
BUN: 17 mg/dL (ref 8–23)
CO2: 26 mmol/L (ref 22–32)
Calcium: 9.2 mg/dL (ref 8.9–10.3)
Chloride: 95 mmol/L — ABNORMAL LOW (ref 98–111)
Creatinine, Ser: 0.92 mg/dL (ref 0.61–1.24)
GFR, Estimated: >60 mL/min (ref >60)
Glucose, Bld: 121 mg/dL — ABNORMAL HIGH (ref 70–99)
Potassium: 3.7 mmol/L (ref 3.5–5.1)
Sodium: 133 mmol/L — ABNORMAL LOW (ref 135–145)

## 2023-01-07 LAB — CBC WITH DIFFERENTIAL/PLATELET
Abs Immature Granulocytes: 0.04 10*3/uL (ref 0.00–0.07)
Basophils Absolute: 0 10*3/uL (ref 0.0–0.1)
Basophils Relative: 0 %
Eosinophils Absolute: 0.4 10*3/uL (ref 0.0–0.5)
Eosinophils Relative: 3 %
HCT: 38.8 % — ABNORMAL LOW (ref 39.0–52.0)
Hemoglobin: 13 g/dL (ref 13.0–17.0)
Immature Granulocytes: 0 %
Lymphocytes Relative: 15 %
Lymphs Abs: 1.8 10*3/uL (ref 0.7–4.0)
MCH: 28.3 pg (ref 26.0–34.0)
MCHC: 33.5 g/dL (ref 30.0–36.0)
MCV: 84.5 fL (ref 80.0–100.0)
Monocytes Absolute: 1 10*3/uL (ref 0.1–1.0)
Monocytes Relative: 9 %
Neutro Abs: 8.6 10*3/uL — ABNORMAL HIGH (ref 1.7–7.7)
Neutrophils Relative %: 73 %
Platelets: 383 10*3/uL (ref 150–400)
RBC: 4.59 MIL/uL (ref 4.22–5.81)
RDW: 12.8 % (ref 11.5–15.5)
WBC: 11.9 10*3/uL — ABNORMAL HIGH (ref 4.0–10.5)
nRBC: 0 % (ref 0.0–0.2)

## 2023-01-07 LAB — TYPE AND SCREEN
ABO/RH(D): A POS
Antibody Screen: NEGATIVE

## 2023-01-07 NOTE — Telephone Encounter (Signed)
Manuel [name] from [facility] came by to give me a number to give to the patient to contact [employer] regarding his wound vac. I was told that the patient had to contact them today regarding this as they have been attempting to contact the patient. I called the patient and gave him the number of [phone] to call and let him know the importance of calling them.

## 2023-01-07 NOTE — Patient Instructions (Addendum)
Your procedure is scheduled on: Thursday, September 19 Report to the Registration Desk on the 1st floor of the [facility] To find out your arrival time, please call [phone] between 1PM - 3PM on: Wednesday, September 18 If your arrival time is 6:00 am, do not arrive before that time as the [facility] entrance doors do not open until 6:00 am.  REMEMBER: Instructions that are not followed completely may result in serious medical risk, up to and including death; or upon the discretion of your surgeon and anesthesiologist your surgery may need to be rescheduled.  Do not eat or drink after midnight the night before surgery.  No gum chewing or hard candies.  One week prior to surgery: starting today, September 17 Stop Anti-inflammatories (NSAIDS) such as Advil, Aleve, Ibuprofen, Motrin, Naproxen, Naprosyn and Aspirin based products such as Excedrin, Goody's Powder, BC Powder. Stop ANY OVER THE COUNTER supplements until after surgery. You may however, continue to take Tylenol if needed for pain up until the day of surgery.  Continue taking all prescribed medications including the aspirin and clopidogrel (Plavix). (Per Sheppard Plumber, NP)  TAKE ONLY THESE MEDICATIONS THE MORNING OF SURGERY WITH A SIP OF WATER:  Carvedilol Gabapentin Aspirin  Do NOT take any insulin the day of surgery.  No Alcohol for 24 hours before or after surgery.  No Smoking including e-cigarettes for 24 hours before surgery.  No chewable tobacco products for at least 6 hours before surgery.  No nicotine patches on the day of surgery.  Do not use any "recreational" drugs for at least a week (preferably 2 weeks) before your surgery.  Please be advised that the combination of cocaine and anesthesia may have negative outcomes, up to and including death. If you test positive for cocaine, your surgery will be cancelled.  On the morning of surgery brush your teeth with toothpaste and water, you may rinse your mouth  with mouthwash if you wish. Do not swallow any toothpaste or mouthwash.  Use CHG Soap as directed on instruction sheet.  Do not wear jewelry, make-up, hairpins, clips or nail polish.  For welded (permanent) jewelry: bracelets, anklets, waist bands, etc.  Please have this removed prior to surgery.  If it is not removed, there is a chance that hospital personnel will need to cut it off on the day of surgery.  Do not wear lotions, powders, or perfumes.   Do not shave body hair from the neck down 48 hours before surgery.  Contact lenses, hearing aids and dentures may not be worn into surgery.  Do not bring valuables to the hospital. [facility] is not responsible for any missing/lost belongings or valuables.   Notify your doctor if there is any change in your medical condition (cold, fever, infection).  Wear comfortable clothing (specific to your surgery type) to the hospital.  After surgery, you can help prevent lung complications by doing breathing exercises.  Take deep breaths and cough every 1-2 hours.  If you are being discharged the day of surgery, you will not be allowed to drive home. You will need a responsible individual to drive you home and stay with you for 24 hours after surgery.   If you are taking public transportation, you will need to have a responsible individual with you.  Please call the Pre-admissions Testing Dept. at [phone] if you have any questions about these instructions.  Surgery Visitation Policy:  Patients having surgery or a procedure may have two visitors.  Children under  the age of 40 must have an adult with them who is not the patient.     Preparing for Surgery with CHLORHEXIDINE GLUCONATE (CHG) Soap  Chlorhexidine Gluconate (CHG) Soap  o An antiseptic cleaner that kills germs and bonds with the skin to continue killing germs even after washing  o Used for showering the night before surgery and morning of surgery  Before surgery, you  can play an important role by reducing the number of germs on your skin.  CHG (Chlorhexidine gluconate) soap is an antiseptic cleanser which kills germs and bonds with the skin to continue killing germs even after washing.  Please do not use if you have an allergy to CHG or antibacterial soaps. If your skin becomes reddened/irritated stop using the CHG.  1. Shower the NIGHT BEFORE SURGERY and the MORNING OF SURGERY with CHG soap.  2. If you choose to wash your hair, wash your hair first as usual with your normal shampoo.  3. After shampooing, rinse your hair and body thoroughly to remove the shampoo.  4. Use CHG as you would any other liquid soap. You can apply CHG directly to the skin and wash gently with a scrungie or a clean washcloth.  5. Apply the CHG soap to your body only from the neck down. Do not use on open wounds or open sores. Avoid contact with your eyes, ears, mouth, and genitals (private parts). Wash face and genitals (private parts) with your normal soap.  6. Wash thoroughly, paying special attention to the area where your surgery will be performed.  7. Thoroughly rinse your body with warm water.  8. Do not shower/wash with your normal soap after using and rinsing off the CHG soap.  9. Pat yourself dry with a clean towel.  10. Wear clean pajamas to bed the night before surgery.  12. Place clean sheets on your bed the night of your first shower and do not sleep with pets.  13. Shower again with the CHG soap on the day of surgery prior to arriving at the hospital.  14. Do not apply any deodorants/lotions/powders.  15. Please wear clean clothes to the hospital.

## 2023-01-08 ENCOUNTER — Encounter: Payer: Self-pay | Admitting: Vascular Surgery

## 2023-01-08 ENCOUNTER — Encounter (INDEPENDENT_AMBULATORY_CARE_PROVIDER_SITE_OTHER): Payer: Medicare Other

## 2023-01-08 ENCOUNTER — Ambulatory Visit (INDEPENDENT_AMBULATORY_CARE_PROVIDER_SITE_OTHER): Payer: Medicare Other | Admitting: Nurse Practitioner

## 2023-01-08 NOTE — Progress Notes (Signed)
Perioperative / Anesthesia Services  Pre-Admission Testing Clinical Review / Pre-Operative Anesthesia Consult  Date: 01/08/23  Patient Demographics:  Name: Manuel [name] DOB:   08-17-1945 MRN:   [id number]  Planned Surgical Procedure(s):    Case: [id number] Date/Time: 01/09/23 0841   Procedures:      DEBRIDEMENT WOUND (Right: Groin)     APPLICATION OF WOUND VAC (Right: Groin)   Anesthesia type: General   Pre-op diagnosis: WOUND DEHISCENCE   Location: [facility] OR ROOM 08 / [facility]   Surgeons: Annice Needy, MD     NOTE: Available PAT nursing documentation and vital signs have been reviewed. Clinical nursing staff has updated patient's PMH/PSHx, current medication list, and drug allergies/intolerances to ensure comprehensive history available to assist in medical decision making as it pertains to the aforementioned surgical procedure and anticipated anesthetic course. Extensive review of available clinical information personally performed. Manuel [name] PMH and PSHx updated with any diagnoses/procedures that  may have been inadvertently omitted during his intake with the pre-admission testing department's nursing staff.  Clinical Discussion:  Manuel [name] is a 77 y.o. male who is submitted for pre-surgical anesthesia review and clearance prior to him undergoing the above procedure. Patient has never been a smoker. Pertinent PMH includes: CAD, inferior STEMI, ischemic cardiomyopathy, PAD, BILATERAL carotid artery disease, HTN, HLD, T2DM, OSAH (no nocturnal PAP therapy), OA, peripheral neuropathy, lumbar DDD with radiculopathy.  Patient is followed by cardiology Lorelle Gibbs, MD). He was last seen in the cardiology clinic on 12/13/2022; notes reviewed. At the time of his clinic visit, patient doing well overall from a cardiovascular perspective. Patient denied any chest pain, shortness of breath, PND, orthopnea, palpitations, significant peripheral edema, weakness, fatigue,  vertiginous symptoms, or presyncope/syncope. Patient with a past medical history significant for cardiovascular diagnoses. Documented physical exam was grossly benign, providing no evidence of acute exacerbation and/or decompensation of the patient's known cardiovascular conditions.  Patient suffered an acute inferior wall STEMI on 04/19/2010.  He initially presented for care here at [facility], however following stabilization, patient was transferred to [facility] for vascular intervention.  Diagnostic LEFT heart catheterization was performed on 04/19/2010 revealing multivessel CAD; 50-70% proximal D1, 80% proximal ramus intermedius, and sequential 90% stenoses x 3 of the RCA.  PCI was subsequently performed with a significant degree of difficulty.  Overlapping 3.0 x 23 mm and 3.0 x 12 mm Xience stents were placed to the RCA.  Procedure yielded excellent angiographic result and TIMI-3 flow.  Myocardial perfusion imaging study was performed on 11/10/2018 revealing a mildly reduced left ventricular systolic function with an EF of 45%.  There was inferior wall hypokinesis noted.  SPECT images demonstrated a moderate mixed perfusion abnormality in the inferior region on stress images consistent with patient's previous MI and associated peri-infarct ischemia.  Overall, study was determined to be low risk.  Following his STEMI, patient with an ischemic cardiomyopathy diagnosis. Cardiac function has been monitored since time of diagnosis.  Most recent TTE was performed on 11/14/2022 revealing severely reduced left ventricular systolic function with an EF of 25-30%.  There was basal anteroseptal, apical lateral, apical septal, and apex akinesis noted.  Additionally, there was mid anterolateral segment hypokinesis.  Right ventricular size and function was normal.  There was mild to moderate mitral and tricuspid valve regurgitation.  All transvalvular gradients were  noted be normal providing no evidence suggestive of valvular stenosis.  Aorta normal in size with no evidence  of aneurysmal dilatation.  Patient with a history of significant peripheral vascular disease.  She has undergone multiple interventions with vascular surgery.  Patient underwent PTA of the RIGHT peroneal, RIGHT tibioperoneal, RIGHT distal SFA, RIGHT above the knee popliteal artery and RIGHT posterior tibial arteries on 05/14/2021.  7 mm x 12 cm LifeStent was placed to the distal SFA.  Patient underwent PTA of the RIGHT SFA and popliteal arteries on 06/14/2021.  Patient required catheter directed thrombolytic therapy using TPA.  Patient underwent PTA of the RIGHT SFA, RIGHT popliteal, RIGHT tibioperoneal, and RIGHT posterior tibial artery on 06/15/2021.  RIGHT SFA required mechanical thrombectomy.  She underwent placement of an 8 mm x 25 cm Viabahn stent to the RIGHT popliteal and SFA.  Patient underwent endarterectomies of the BILATERAL common femoral, profunda femoris, and SFA arteries on 11/15/2022.  Fogarty embolectomy performed to the RIGHT SFA and popliteal arteries.  PTA of the RIGHT posterior tibial artery and popliteal artery performed. Kissing balloon expandable stents placed to the BILATERAL common iliac arteries (12 mm x 58 mm Lifestream).  Additional stenting of the LEFT external iliac artery required using a 14 mm x 6 mm Lifestream stent and a 13 mm x 5 cm Viabahn stent.  Given patient's significant PVD and history of cardiovascular stents, patient remains on daily DAPT therapy using ASA + clopidogrel.  Patient is reportedly compliant with therapy with no reports of GI/GU related bleeding. Blood pressure reasonably controlled at 142/70 mmHg on currently prescribed CCB (amlodipine) beta-blocker (carvedilol) and diuretic (torsemide + spironolactone therapies. Patient is on atorvastatin for his HLD diagnosis and ASCVD prevention.  T2DM loosely controlled on currently prescribed regimen;  last HgbA1c was 7.9% when checked on 10/14/2022.  He does have an OSAH diagnosis, however does not require the use of nocturnal PAP therapy.  Functional capacity limited by patient's age, claudication pain, and multiple medical comorbidities.  He is able to complete his ADLs/IADLs without significant cardiovascular limitation.  Despite limitations, patient still felt to be able to achieve at least 4 METS of physical activity without experiencing any significant degree of angina/anginal equivalent symptoms.  GDMT for heart failure escalated; carvedilol increased to 25 mg twice daily, losartan increased to 25 mg twice daily, and spironolactone will continue at 50 mg daily.  Amlodipine and torsemide were discontinued.  In the setting of known cardiovascular diagnoses and concurrent T2DM, plans are to add an SGLT2i during patient's next visit.  Patient follow-up with outpatient cardiology in 3 months or sooner if needed.  Manuel [name] is scheduled for DEBRIDEMENT WOUND (Right: Groin); APPLICATION OF WOUND VAC (Right: Groin) on 01/09/2023 with Dr. Festus Barren, MD.  Given patient's past medical history significant for cardiovascular diagnoses, presurgical cardiac clearance was sought by the PAT team. Per cardiology, "this patient is optimized for surgery and may proceed with the planned procedural course with a LOW risk of significant perioperative cardiovascular complications".   Again, patient is on daily DAPT therapy.  Given his extensive PVD and cardiovascular stent placement, patient will continue his DAPT medications throughout his perioperative course.  Patient denies previous perioperative complications with anesthesia in the past. In review of the available records, it is noted that patient underwent a general anesthetic course here at [facility] ([facility]) in 10/2022 without documented complications.      01/07/2023    8:54 AM 01/01/2023    3:11 PM 12/12/2022    1:45 PM   Vitals with BMI  Height 5\' 8"   5\' 8"  5\' 8"   Weight 187 lbs 194 lbs 10 oz 200 lbs  BMI 28.44 29.6 30.42  Systolic 156 150 841  Diastolic 73 80 72  Pulse 73 80 71    Providers/Specialists:   NOTE: Primary physician provider listed below. Patient may have been seen by APP or partner within same practice.   PROVIDER ROLE / SPECIALTY LAST Shanna Cisco, MD Vascular Surgery (Surgeon) 01/01/2023  Gracelyn Nurse, MD Primary Care Provider 10/18/2022  Tiajuana Amass, MD Cardiology 12/13/2022   Allergies:  Patient has no known allergies.  Current Home Medications:   No current facility-administered medications for this encounter.    aspirin EC 81 MG tablet   atorvastatin (LIPITOR) 80 MG tablet   carvedilol (COREG) 25 MG tablet   clopidogrel (PLAVIX) 75 MG tablet   Ensure Max Protein (ENSURE MAX PROTEIN) LIQD   fluticasone (FLONASE) 50 MCG/ACT nasal spray   gabapentin (NEURONTIN) 400 MG capsule   insulin aspart protamine- aspart (NOVOLOG MIX 70/30) (70-30) 100 UNIT/ML injection   levocetirizine (XYZAL) 5 MG tablet   nystatin (MYCOSTATIN/NYSTOP) powder   senna-docusate (SENOKOT-S) 8.6-50 MG tablet   spironolactone (ALDACTONE) 50 MG tablet   History:   Past Medical History:  Diagnosis Date   Acute ST elevation myocardial infarction (STEMI) of inferior wall (HCC) 04/19/2010   a.) transfered from [facility] to [facility] --> LHC/PCI (very difficult procedure) --> 3.0 x 23 mm and 3.0 x 12 mm Xience stents to RCA   Allergies    Arthritis    Benign essential hypertension    Bilateral carotid artery disease (HCC) 05/08/2021   a.) carotid doppler 05/08/2021: 1-39% BICA   CAD (coronary artery disease) 04/19/2010   a.) inferior STEMI 04/19/2010 --> LHC/PCI: 50-70% pD1, 80% pRI, 90/90/90% RCA (overlapping 3.0 x 23 and 3.0 x 12 mm Xience DES); b.) MV 11/10/2018: fixed minimally reversible inferior perfusion defect   Cellulitis of foot    DDD (degenerative disc disease), cervical    Diabetes  mellitus type 2, insulin dependent (HCC)    Diverticulosis    Full dentures    Gout    Hard of hearing    History of bilateral cataract extraction 2022   History of ETOH abuse    Hyperlipidemia    Ischemic cardiomyopathy 04/19/2010   a.) TTE 04/19/2010: 40%; b.) TTE 04/20/2014: EF >55%, mild RVE, triv PR, mild MR/TR, G1DD; c.) TTE 11/10/2018: EF 45%, inf HK, mild RVE, triv TR/PR, mild MR, G1DD; d.) TTE 11/14/2022: EF 25-30%, no RWMAs, mild-mod MR/TR   Long term current use of aspirin    Long term current use of clopidogrel    Lumbar degenerative disc disease    Lumbar radiculopathy    Lumbar vertebral fracture (chronic superior endplate of L1)    OSA (obstructive sleep apnea)    a.) unable to tolerate nocturnal PAP therapy   Peripheral artery disease (HCC)    a.) stenting 05/14/21: 12 mm x 12 cm LifeStent RIGHT dis SFA/prox pop; b.) s/p cath directed thrombolysis RIGHT SFA/pop 06/14/21; c.) s/p mech thrombectomy + stenting 06/15/21: 8 mm x 25cm & 8 mm x 7.5cm Viabahn; d.) s/p BILAT CFA, profunda femoris, SFA endarterectomies + fogarty embolectomy + stenting 11/15/22: 12 mm x 58 mm LifeStent BILAT CIAs, 14 mm x 6 cm LifeStent & 13 mm x 5 cm Viabahn LEFT EIA   Peripheral neuropathy    Umbilical hernia    Past Surgical History:  Procedure Laterality Date   AMPUTATION Right 11/18/2022  Procedure: AMPUTATION 4TH AND 5TH RAY;  Surgeon: Linus Galas, [name];  Location: [facility] ORS;  Service: Orthopedics/Podiatry;  Laterality: Right;  4th and 5th toe   CATARACT EXTRACTION W/PHACO Right 03/14/2021   Procedure: CATARACT EXTRACTION PHACO AND INTRAOCULAR LENS PLACEMENT (IOC) RIGHT DIABETIC;  Surgeon: Lockie Mola, MD;  Location: [facility];  Service: Ophthalmology;  Laterality: Right;  Diabetic 16.78 01:39.9   CATARACT EXTRACTION W/PHACO Left 03/28/2021   Procedure: CATARACT EXTRACTION PHACO AND INTRAOCULAR LENS PLACEMENT (IOC) LEFT DIABETIC 6.93 01:22.0;  Surgeon: Lockie Mola, MD;   Location: [facility];  Service: Ophthalmology;  Laterality: Left;  Diabetic   COLONOSCOPY     CORONARY ANGIOPLASTY WITH STENT PLACEMENT  03/2010   Procedure: CORONARY ANGIOPLASTY WITH STENT PLACEMENT; Location: [name]   ENDARTERECTOMY FEMORAL Bilateral 11/15/2022   Procedure: BILATERAL COMMON FEMORAL PROFUNDA FEMORIS AND SUPERFICIAL FEMORAL ARTERY ENDARTECTOMIES, RIGHT FOGARTY EMBOLECTOMY OF THE RIGHT SFA  AND  POPLITEAL ARTERIES. AORTAGRAM AND RIGHT LOWER EXTREMITY ANGIOGRAM.;  Surgeon: Annice Needy, MD;  Location: [facility] ORS;  Service: Vascular;  Laterality: Bilateral;   INSERTION OF ILIAC STENT Bilateral 11/15/2022   Procedure: BILATERAL STENT INSERTION IN BILATERAL  COMMON ILIAC ARTERY, STENT INSERTION OF LEFT EXTERNAL ILIAC ARTERY. ANGIOPLASTY RIGHT TIBIAL  AND POPLITEAL ARTERY.;  Surgeon: Annice Needy, MD;  Location: [facility] ORS;  Service: Vascular;  Laterality: Bilateral;   LOWER EXTREMITY ANGIOGRAPHY Right 05/14/2021   Procedure: LOWER EXTREMITY ANGIOGRAPHY;  Surgeon: Annice Needy, MD;  Location: [facility];  Service: Cardiovascular;  Laterality: Right;   LOWER EXTREMITY ANGIOGRAPHY Right 06/14/2021   Procedure: Lower Extremity Angiography;  Surgeon: Annice Needy, MD;  Location: [facility];  Service: Cardiovascular;  Laterality: Right;   LOWER EXTREMITY ANGIOGRAPHY Right 11/11/2022   Procedure: Lower Extremity Angiography;  Surgeon: Annice Needy, MD;  Location: [facility];  Service: Cardiovascular;  Laterality: Right;   LOWER EXTREMITY INTERVENTION Right 06/15/2021   Procedure: LOWER EXTREMITY INTERVENTION;  Surgeon: Annice Needy, MD;  Location: [facility] INV[facility] CV LAB;  Service: Cardiovascular;  Laterality: Right;   TONSILLECTOMY     Family History  Problem Relation Age of Onset   Scoliosis Mother    Heart disease Father    Social History   Tobacco Use   Smoking status: Never   Smokeless tobacco: Never  Vaping Use   Vaping status: Never Used   Substance Use Topics   Alcohol use: Yes    Alcohol/week: 21.0 standard drinks of alcohol    Types: 7 Glasses of wine, 14 Cans of beer per week    Comment: occassional   Drug use: No    Pertinent Clinical Results:  LABS:   Hospital Outpatient Visit on 01/07/2023  Component Date Value Ref Range Status   Sodium 01/07/2023 133 (L)  135 - 145 mmol/L Final   Potassium 01/07/2023 3.7  3.5 - 5.1 mmol/L Final   Chloride 01/07/2023 95 (L)  98 - 111 mmol/L Final   CO2 01/07/2023 26  22 - 32 mmol/L Final   Glucose, Bld 01/07/2023 121 (H)  70 - 99 mg/dL Final   Glucose reference range applies only to samples taken after fasting for at least 8 hours.   BUN 01/07/2023 17  8 - 23 mg/dL Final   Creatinine, Ser 01/07/2023 0.92  0.61 - 1.24 mg/dL Final   Calcium 32/44/0102 9.2  8.9 - 10.3 mg/dL Final   GFR, Estimated 01/07/2023 >60  >60 mL/min Final   Comment: (NOTE)  Calculated using the CKD-EPI Creatinine Equation (2021)    Anion gap 01/07/2023 12  5 - 15 Final   Performed at [facility], [address] [place], [place] [address]   ABO/RH(D) 01/07/2023 A POS   Final   Antibody Screen 01/07/2023 NEG   Final   Sample Expiration 01/07/2023 01/21/2023,2359   Final   Extend sample reason 01/07/2023    Final                   Value:NO TRANSFUSIONS OR PREGNANCY IN THE PAST 3 MONTHS Performed at [facility], [address] [place], [place] [address]    WBC 01/07/2023 11.9 (H)  4.0 - 10.5 K/uL Final   RBC 01/07/2023 4.59  4.22 - 5.81 MIL/uL Final   Hemoglobin 01/07/2023 13.0  13.0 - 17.0 g/dL Final   HCT 52/84/1324 38.8 (L)  39.0 - 52.0 % Final   MCV 01/07/2023 84.5  80.0 - 100.0 fL Final   MCH 01/07/2023 28.3  26.0 - 34.0 pg Final   MCHC 01/07/2023 33.5  30.0 - 36.0 g/dL Final   RDW 40/01/2724 12.8  11.5 - 15.5 % Final   Platelets 01/07/2023 383  150 - 400 K/uL Final   nRBC 01/07/2023 0.0  0.0 - 0.2 % Final   Neutrophils Relative % 01/07/2023 73  % Final   Neutro Abs  01/07/2023 8.6 (H)  1.7 - 7.7 K/uL Final   Lymphocytes Relative 01/07/2023 15  % Final   Lymphs Abs 01/07/2023 1.8  0.7 - 4.0 K/uL Final   Monocytes Relative 01/07/2023 9  % Final   Monocytes Absolute 01/07/2023 1.0  0.1 - 1.0 K/uL Final   Eosinophils Relative 01/07/2023 3  % Final   Eosinophils Absolute 01/07/2023 0.4  0.0 - 0.5 K/uL Final   Basophils Relative 01/07/2023 0  % Final   Basophils Absolute 01/07/2023 0.0  0.0 - 0.1 K/uL Final   Immature Granulocytes 01/07/2023 0  % Final   Abs Immature Granulocytes 01/07/2023 0.04  0.00 - 0.07 K/uL Final   Performed at [facility], [address] [place], [place] [address]    ECG: Date: 11/10/2022 Time ECG obtained: 0953 AM Rate: 70 bpm Rhythm:  Sinus rhythm with nonspecific IVCD Axis (leads I and aVF): Normal Intervals: PR 188 ms. QRS 133 ms. QTc 504 ms. ST segment and T wave changes: No evidence of acute ST segment elevation or depression.  Evidence of a possible age undetermined inferior and anterolateral infarct present. Comparison: Similar to previous tracing obtained on 08/21/2022; nonspecific IVCD appears to be new when compared to previous .  IMAGING / PROCEDURES: VAS [place] ABI WITH/WO TBI performed on 01/01/2023 Resting right ankle-brachial index (0.67) indicates moderate right lower extremity arterial disease. The right toe-brachial index is abnormal.  Resting left ankle-brachial index (0.82) indicates mild left lower extremity arterial disease. The left toe-brachial index is abnormal.   TRANSTHORACIC ECHOCARDIOGRAM performed on 11/14/2022 Left ventricular ejection fraction, by estimation, is 25 to 30%. The left ventricle has severely decreased function. Basal anteroseptal, apical lateral, apical septal, and apex akinesis noted.  Additionally, there was mid anterolateral segment hypokinesis. Left ventricular diastolic parameters were normal. Right ventricular systolic function is normal. The right ventricular size is  normal.  The mitral valve is normal in structure. Mild to moderate mitral valve regurgitation. No evidence of mitral stenosis.  Tricuspid valve regurgitation is mild to moderate.  The aortic valve is normal in structure. Aortic valve regurgitation is not visualized.  No aortic stenosis is present.  The inferior vena cava is normal in size with greater than 50% respiratory variability, suggesting right atrial pressure of 3 mmHg.   CT ABDOMEN PELVIS W CONTRAST performed on 06/16/2021 Extensive atherosclerotic calcification of the abdominal aorta. 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 Recommend evaluation by vascular surgery RIGHT femoral artery stent, incompletely imaged. Incidentally noted 5 millimeter calculus in the distal RIGHT ureter, in the region of the ureterovesical junction. There is no associated hydronephrosis. Chronicity of this finding is not known. Chronic superior endplate fracture of L1. Small fat containing paraumbilical hernia. Significant stool burden. Colonic diverticulosis.  Impression and Plan:  Manuel [name] has been referred for pre-anesthesia review and clearance prior to him undergoing the planned anesthetic and procedural courses. Available labs, pertinent testing, and imaging results were personally reviewed by me in preparation for upcoming operative/procedural course. [facility] medical record has been updated following extensive record review and patient interview with PAT staff.   This patient has been appropriately cleared by cardiology with an overall LOW risk of experiencing significant perioperative cardiovascular complications. Based on clinical review performed today (01/08/23), barring any significant acute changes in the patient's overall condition, it is anticipated that he will be able to proceed with the planned surgical intervention. Any acute changes in clinical condition may necessitate his procedure being  postponed and/or cancelled. Patient will meet with anesthesia team (MD and/or CRNA) on the day of his procedure for preoperative evaluation/assessment. Questions regarding anesthetic course will be fielded at that time.   Pre-surgical instructions were reviewed with the patient during his PAT appointment, and questions were fielded to satisfaction by PAT clinical staff. He has been instructed on which medications that he will need to hold prior to surgery, as well as the ones that have been deemed safe/appropriate to take on the day of his procedure. As part of the general education provided by PAT, patient made aware both verbally and in writing, that he would need to abstain from the use of any illegal substances during his perioperative course.  He was advised that failure to follow the provided instructions could necessitate case cancellation or result in serious perioperative complications up to and including death. Patient encouraged to contact PAT and/or his surgeon's office to discuss any questions or concerns that may arise prior to surgery; verbalized understanding.   Quentin Mulling, MSN, APRN, FNP-C, CEN [facility]  Perioperative Services Nurse Practitioner Phone: [phone] Fax: [phone] 01/08/23 10:35 AM  NOTE: This note has been prepared using Dragon dictation software. Despite my best ability to proofread, there is always the potential that unintentional transcriptional errors may still occur from this process.

## 2023-01-09 ENCOUNTER — Ambulatory Visit: Payer: Medicare Other | Admitting: Urgent Care

## 2023-01-09 ENCOUNTER — Encounter
Admission: RE | Disposition: A | Discharge status: Home / Self Care | Payer: Self-pay | Source: Home / Self Care | Attending: Vascular Surgery

## 2023-01-09 ENCOUNTER — Ambulatory Visit
Admission: RE | Admit: 2023-01-09 | Discharge: 2023-01-09 | Disposition: A | Discharge status: Home / Self Care | Payer: Medicare Other | Attending: Vascular Surgery | Admitting: Vascular Surgery

## 2023-01-09 ENCOUNTER — Other Ambulatory Visit: Payer: Self-pay

## 2023-01-09 ENCOUNTER — Encounter: Payer: Self-pay | Admitting: Vascular Surgery

## 2023-01-09 DIAGNOSIS — Z794 Long term (current) use of insulin: Secondary | ICD-10-CM | POA: Diagnosis not present

## 2023-01-09 DIAGNOSIS — I2510 Atherosclerotic heart disease of native coronary artery without angina pectoris: Secondary | ICD-10-CM | POA: Insufficient documentation

## 2023-01-09 DIAGNOSIS — E1151 Type 2 diabetes mellitus with diabetic peripheral angiopathy without gangrene: Secondary | ICD-10-CM | POA: Insufficient documentation

## 2023-01-09 DIAGNOSIS — G4733 Obstructive sleep apnea (adult) (pediatric): Secondary | ICD-10-CM | POA: Diagnosis not present

## 2023-01-09 DIAGNOSIS — I509 Heart failure, unspecified: Secondary | ICD-10-CM | POA: Diagnosis not present

## 2023-01-09 DIAGNOSIS — T8149XA Infection following a procedure, other surgical site, initial encounter: Secondary | ICD-10-CM

## 2023-01-09 DIAGNOSIS — L7682 Other postprocedural complications of skin and subcutaneous tissue: Secondary | ICD-10-CM | POA: Diagnosis not present

## 2023-01-09 DIAGNOSIS — E1142 Type 2 diabetes mellitus with diabetic polyneuropathy: Secondary | ICD-10-CM | POA: Diagnosis not present

## 2023-01-09 DIAGNOSIS — T8189XA Other complications of procedures, not elsewhere classified, initial encounter: Secondary | ICD-10-CM

## 2023-01-09 DIAGNOSIS — M1990 Unspecified osteoarthritis, unspecified site: Secondary | ICD-10-CM | POA: Diagnosis not present

## 2023-01-09 DIAGNOSIS — E785 Hyperlipidemia, unspecified: Secondary | ICD-10-CM | POA: Insufficient documentation

## 2023-01-09 DIAGNOSIS — T8130XA Disruption of wound, unspecified, initial encounter: Secondary | ICD-10-CM | POA: Insufficient documentation

## 2023-01-09 DIAGNOSIS — R45851 Suicidal ideations: Secondary | ICD-10-CM | POA: Diagnosis not present

## 2023-01-09 DIAGNOSIS — X58XXXA Exposure to other specified factors, initial encounter: Secondary | ICD-10-CM | POA: Diagnosis not present

## 2023-01-09 DIAGNOSIS — M5116 Intervertebral disc disorders with radiculopathy, lumbar region: Secondary | ICD-10-CM | POA: Insufficient documentation

## 2023-01-09 DIAGNOSIS — I252 Old myocardial infarction: Secondary | ICD-10-CM | POA: Insufficient documentation

## 2023-01-09 DIAGNOSIS — I110 Hypertensive heart disease with heart failure: Secondary | ICD-10-CM | POA: Diagnosis not present

## 2023-01-09 DIAGNOSIS — E119 Type 2 diabetes mellitus without complications: Secondary | ICD-10-CM

## 2023-01-09 DIAGNOSIS — Z01812 Encounter for preprocedural laboratory examination: Secondary | ICD-10-CM

## 2023-01-09 HISTORY — DX: Polyneuropathy, unspecified: G62.9

## 2023-01-09 HISTORY — DX: Obstructive sleep apnea (adult) (pediatric): G47.33

## 2023-01-09 HISTORY — DX: Other cervical disc degeneration, unspecified cervical region: M50.30

## 2023-01-09 HISTORY — DX: Umbilical hernia without obstruction or gangrene: K42.9

## 2023-01-09 HISTORY — DX: Alcohol abuse, in remission: F10.11

## 2023-01-09 HISTORY — DX: Complete loss of teeth, unspecified cause, unspecified class: K08.109

## 2023-01-09 HISTORY — PX: APPLICATION OF WOUND VAC: SHX5189

## 2023-01-09 HISTORY — DX: Complete loss of teeth, unspecified cause, unspecified class: Z97.2

## 2023-01-09 HISTORY — DX: Unspecified fracture of unspecified lumbar vertebra, initial encounter for closed fracture: S32.009A

## 2023-01-09 HISTORY — PX: WOUND DEBRIDEMENT: SHX247

## 2023-01-09 LAB — GLUCOSE, CAPILLARY
Glucose-Capillary: 118 mg/dL — ABNORMAL HIGH (ref 70–99)
Glucose-Capillary: 102 mg/dL — ABNORMAL HIGH (ref 70–99)

## 2023-01-09 SURGERY — DEBRIDEMENT, WOUND
Anesthesia: General | Site: Groin | Laterality: Right

## 2023-01-09 MED ORDER — OXYCODONE HCL 5 MG/5ML PO SOLN: 5.0000 mg | Freq: Once | ORAL | Status: DC | PRN

## 2023-01-09 MED ORDER — CHLORHEXIDINE GLUCONATE 0.12 % MT SOLN
OROMUCOSAL | Status: AC
  Filled 2023-01-09: qty 15

## 2023-01-09 MED ORDER — SODIUM CHLORIDE 0.9 % IV SOLN: INTRAVENOUS | Status: DC

## 2023-01-09 MED ORDER — ONDANSETRON HCL 4 MG/2ML IJ SOLN: 4.0000 mg | Freq: Four times a day (QID) | INTRAMUSCULAR | Status: DC | PRN

## 2023-01-09 MED ORDER — CEFAZOLIN SODIUM-DEXTROSE 2-4 GM/100ML-% IV SOLN
INTRAVENOUS | Status: AC
  Filled 2023-01-09: qty 100

## 2023-01-09 MED ORDER — FAMOTIDINE 20 MG PO TABS
20.0000 mg | ORAL_TABLET | Freq: Once | ORAL | Status: AC
  Administered 2023-01-09: 20 mg via ORAL

## 2023-01-09 MED ORDER — FENTANYL CITRATE (PF) 100 MCG/2ML IJ SOLN
INTRAMUSCULAR | Status: AC
  Filled 2023-01-09: qty 2

## 2023-01-09 MED ORDER — PROMETHAZINE HCL 25 MG/ML IJ SOLN: 6.2500 mg | INTRAMUSCULAR | Status: DC | PRN

## 2023-01-09 MED ORDER — CEFAZOLIN SODIUM-DEXTROSE 2-4 GM/100ML-% IV SOLN
2.0000 g | INTRAVENOUS | Status: AC
  Administered 2023-01-09: 2 g via INTRAVENOUS

## 2023-01-09 MED ORDER — KETAMINE HCL 10 MG/ML IJ SOLN
INTRAMUSCULAR | Status: DC | PRN
  Administered 2023-01-09: 10 mg via INTRAVENOUS

## 2023-01-09 MED ORDER — OXYCODONE HCL 5 MG PO TABS: 5.0000 mg | ORAL_TABLET | Freq: Once | ORAL | Status: DC | PRN

## 2023-01-09 MED ORDER — KETAMINE HCL 50 MG/5ML IJ SOSY
PREFILLED_SYRINGE | INTRAMUSCULAR | Status: AC
  Filled 2023-01-09: qty 5

## 2023-01-09 MED ORDER — HYDROMORPHONE HCL 1 MG/ML IJ SOLN: 1.0000 mg | Freq: Once | INTRAMUSCULAR | Status: DC | PRN

## 2023-01-09 MED ORDER — DROPERIDOL 2.5 MG/ML IJ SOLN: 0.6250 mg | Freq: Once | INTRAMUSCULAR | Status: DC | PRN

## 2023-01-09 MED ORDER — FENTANYL CITRATE (PF) 100 MCG/2ML IJ SOLN: 25.0000 ug | INTRAMUSCULAR | Status: DC | PRN

## 2023-01-09 MED ORDER — ACETAMINOPHEN 10 MG/ML IV SOLN: 1000.0000 mg | Freq: Once | INTRAVENOUS | Status: DC | PRN

## 2023-01-09 MED ORDER — PROPOFOL 10 MG/ML IV BOLUS
INTRAVENOUS | Status: DC | PRN
  Administered 2023-01-09: 40 mg via INTRAVENOUS
  Administered 2023-01-09 (×3): 30 mg via INTRAVENOUS
  Administered 2023-01-09: 40 mg via INTRAVENOUS
  Administered 2023-01-09: 30 mg via INTRAVENOUS
  Administered 2023-01-09: 70 mg via INTRAVENOUS

## 2023-01-09 MED ORDER — 0.9 % SODIUM CHLORIDE (POUR BTL) OPTIME
TOPICAL | Status: DC | PRN
  Administered 2023-01-09: 500 mL

## 2023-01-09 MED ORDER — FENTANYL CITRATE (PF) 100 MCG/2ML IJ SOLN
INTRAMUSCULAR | Status: DC | PRN
  Administered 2023-01-09: 50 ug via INTRAVENOUS

## 2023-01-09 MED ORDER — FAMOTIDINE 20 MG PO TABS
ORAL_TABLET | ORAL | Status: AC
  Filled 2023-01-09: qty 1

## 2023-01-09 MED ORDER — PROPOFOL 1000 MG/100ML IV EMUL
INTRAVENOUS | Status: AC
  Filled 2023-01-09: qty 100

## 2023-01-09 MED ORDER — CHLORHEXIDINE GLUCONATE 0.12 % MT SOLN
15.0000 mL | Freq: Once | OROMUCOSAL | Status: AC
  Administered 2023-01-09: 15 mL via OROMUCOSAL

## 2023-01-09 MED ORDER — CHLORHEXIDINE GLUCONATE CLOTH 2 % EX PADS
6.0000 | MEDICATED_PAD | Freq: Once | CUTANEOUS | Status: AC
  Administered 2023-01-09: 6 via TOPICAL

## 2023-01-09 MED ORDER — ORAL CARE MOUTH RINSE: 15.0000 mL | Freq: Once | OROMUCOSAL | Status: AC

## 2023-01-09 SURGICAL SUPPLY — 23 items
AGENT HMST PWDR HDRLZ BVN CLGN (Miscellaneous) ×1 IMPLANT
CLEANER CAUTERY TIP PAD (MISCELLANEOUS) IMPLANT
COLLAGEN CELLERATERX 1 GRAM (Miscellaneous) IMPLANT
DRAPE INCISE IOBAN 66X45 STRL (DRAPES) ×1 IMPLANT
DRAPE LAPAROTOMY 100X77 ABD (DRAPES) ×1 IMPLANT
ELECT REM PT RETURN 9FT ADLT (ELECTROSURGICAL) ×1
ELECTRODE REM PT RTRN 9FT ADLT (ELECTROSURGICAL) ×1 IMPLANT
GAUZE 4X4 16PLY ~~LOC~~+RFID DBL (SPONGE) ×1 IMPLANT
GLOVE BIO SURGEON STRL SZ7 (GLOVE) ×2 IMPLANT
GOWN STRL REUS W/ TWL LRG LVL3 (GOWN DISPOSABLE) ×2 IMPLANT
GOWN STRL REUS W/ TWL XL LVL3 (GOWN DISPOSABLE) ×1 IMPLANT
GOWN STRL REUS W/TWL LRG LVL3 (GOWN DISPOSABLE) ×2
GOWN STRL REUS W/TWL XL LVL3 (GOWN DISPOSABLE) ×1
KIT TURNOVER KIT A (KITS) ×1 IMPLANT
MANIFOLD NEPTUNE II (INSTRUMENTS) ×1 IMPLANT
NS IRRIG 1000ML POUR BTL (IV SOLUTION) ×1 IMPLANT
PACK BASIN MINOR ARMC (MISCELLANEOUS) ×1 IMPLANT
PACK UNIVERSAL (MISCELLANEOUS) IMPLANT
SOL PREP PVP 2OZ (MISCELLANEOUS) ×2
SOLUTION PREP PVP 2OZ (MISCELLANEOUS) ×1 IMPLANT
SPONGE T-LAP 18X18 ~~LOC~~+RFID (SPONGE) ×1 IMPLANT
TRAP FLUID SMOKE EVACUATOR (MISCELLANEOUS) ×1 IMPLANT
WATER STERILE IRR 500ML POUR (IV SOLUTION) ×1 IMPLANT

## 2023-01-09 NOTE — Anesthesia Preprocedure Evaluation (Addendum)
Anesthesia Evaluation  Patient identified by MRN, date of birth, ID band Patient awake    Reviewed: Allergy & Precautions, H&P , NPO status , Patient's Chart, lab work & pertinent test results  Airway Mallampati: II  TM Distance: >3 FB Neck ROM: full    Dental  (+) Edentulous Upper, Edentulous Lower   Pulmonary sleep apnea , Patient abstained from smoking.   Pulmonary exam normal        Cardiovascular Exercise Tolerance: Poor hypertension, (-) angina + CAD, + Past MI, + Cardiac Stents, + Peripheral Vascular Disease and +CHF  Normal cardiovascular exam  ECHO 11/14/22:  1. Left ventricular ejection fraction, by estimation, is 25 to 30%. The  left ventricle has severely decreased function. The left ventricle  demonstrates regional wall motion abnormalities (see scoring  diagram/findings for description). Left ventricular  diastolic parameters were normal.   2. Right ventricular systolic function is normal. The right ventricular  size is normal.   3. The mitral valve is normal in structure. Mild to moderate mitral valve  regurgitation. No evidence of mitral stenosis.   4. Tricuspid valve regurgitation is mild to moderate.   5. The aortic valve is normal in structure. Aortic valve regurgitation is  not visualized. No aortic stenosis is present.   6. The inferior vena cava is normal in size with greater than 50%  respiratory variability, suggesting right atrial pressure of 3 mmHg.    Bilateral carotid artery stenosis  EKG 7/21: Nonspecific interventricular conduction delay  MPS 10/2018: LVEF= 45%  FINDINGS:  Regional wall motion:  demonstrates  hypokinesis of the inferior wall.  The overall quality of the study is fair.   Artifacts noted: no  Left ventricular cavity: normal.     Neuro/Psych  PSYCHIATRIC DISORDERS (cleared by psychiatry for recent suggestions of suicidal ideation)       Neuromuscular disease (diabetic  neuropathy)    GI/Hepatic negative GI ROS,,,(+)     substance abuse  alcohol use and marijuana use  Endo/Other  diabetes, Well Controlled, Type 2, Insulin Dependent    Renal/GU      Musculoskeletal  (+) Arthritis ,    Abdominal Normal abdominal exam  (+)   Peds  Hematology negative hematology ROS (+)   Anesthesia Other Findings PAT note reviewed. Per cardiology, "this patient is optimized for surgery and may proceed with the planned procedural course with a LOW risk of significant perioperative cardiovascular complications".    Past Medical History: No date: Allergies No date: Arthritis No date: Diabetes mellitus without   Past Surgical History: 03/14/2021: CATARACT EXTRACTION W/PHACO; Right     Comment:  Procedure: CATARACT EXTRACTION PHACO AND INTRAOCULAR               LENS PLACEMENT (IOC) RIGHT DIABETIC;  Surgeon:               Lockie Mola, MD;  Location: [facility];              Service: Ophthalmology;  Laterality: Right;                Diabetic 16.78 01:39.9 03/28/2021: CATARACT EXTRACTION W/PHACO; Left     Comment:  Procedure: CATARACT EXTRACTION PHACO AND INTRAOCULAR               LENS PLACEMENT (IOC) LEFT DIABETIC 6.93 01:22.0;                Surgeon: Lockie Mola, MD;  Location: [facility];  Service: Ophthalmology;  Laterality: Left;              Diabetic 05/14/2021: LOWER EXTREMITY ANGIOGRAPHY; Right     Comment:  Procedure: LOWER EXTREMITY ANGIOGRAPHY;  Surgeon: Annice Needy, MD;  Location: [facility];  Service:               Cardiovascular;  Laterality: Right; 06/14/2021: LOWER EXTREMITY ANGIOGRAPHY; Right     Comment:  Procedure: Lower Extremity Angiography;  Surgeon: Annice Needy, MD;  Location: [facility];  Service:               Cardiovascular;  Laterality: Right; 11/11/2022: LOWER EXTREMITY ANGIOGRAPHY; Right     Comment:  Procedure: Lower Extremity  Angiography;  Surgeon: Annice Needy, MD;  Location: [facility];  Service:               Cardiovascular;  Laterality: Right; 06/15/2021: LOWER EXTREMITY INTERVENTION; Right     Comment:  Procedure: LOWER EXTREMITY INTERVENTION;  Surgeon: Annice Needy, MD;  Location: [facility];  Service:               Cardiovascular;  Laterality: Right; No date: TONSILLECTOMY     Comment:  had removed as a child  BMI    Body Mass Index: 29.06 kg/m      Reproductive/Obstetrics negative OB ROS                             Anesthesia Physical Anesthesia Plan  ASA: 3  Anesthesia Plan: General   Post-op Pain Management: Ofirmev IV (intra-op)* and Toradol IV (intra-op)*   Induction: Intravenous  PONV Risk Score and Plan: 2 and Propofol infusion, TIVA and Midazolam  Airway Management Planned: Natural Airway  Additional Equipment:   Intra-op Plan:   Post-operative Plan: Extubation in OR  Informed Consent: I have reviewed the patients History and Physical, chart, labs and discussed the procedure including the risks, benefits and alternatives for the proposed anesthesia with the patient or authorized representative who has indicated his/her understanding and acceptance.   Patient has DNR.  Suspend DNR and Discussed DNR with patient.   Dental Advisory Given  Plan Discussed with: CRNA and Surgeon  Anesthesia Plan Comments:         Anesthesia Quick Evaluation

## 2023-01-09 NOTE — Transfer of Care (Signed)
Immediate Anesthesia Transfer of Care Note  Patient: Manuel [name]  Procedure(s) Performed: DEBRIDEMENT WOUND (Right: Groin) APPLICATION OF WOUND VAC (Right: Groin)  Patient Location: PACU  Anesthesia Type:General  Level of Consciousness: drowsy  Airway & Oxygen Therapy: Patient Spontanous Breathing and Patient connected to face mask oxygen  Post-op Assessment: Report given to RN, Post -op Vital signs reviewed and stable, and Patient moving all extremities  Post vital signs: Reviewed and stable  Last Vitals:  Vitals Value Taken Time  BP 98/53 01/09/23 0945  Temp    Pulse 59 01/09/23 0947  Resp 10 01/09/23 0947  SpO2 100 % 01/09/23 0947  Vitals shown include unfiled device data.  Last Pain:  Vitals:   01/09/23 0800  TempSrc: Oral         Complications: No notable events documented.

## 2023-01-09 NOTE — Op Note (Addendum)
    OPERATIVE NOTE   PROCEDURE: Irrigation and debridement of skin, soft tissue, and muscle fascia for approximately 15 cm to the right groin wound with non-disposable negative pressure dressing placement.  PRE-OPERATIVE DIAGNOSIS: Nonviable tissue and infection of right groin wound  POST-OPERATIVE DIAGNOSIS: Same as above  SURGEON: Festus Barren, MD  ASSISTANT(S): None  ANESTHESIA: MAC  ESTIMATED BLOOD LOSS: 10 cc  FINDING(S): None  SPECIMEN(S): None  INDICATIONS:   Manuel [name] is a 77 y.o. male who presents with fibrinous exudate and nonviable tissue in his right groin wound.  He is brought in for wound debridement and negative pressure dressing placement.  Risks and benefits were discussed.  DESCRIPTION: After obtaining full informed written consent, the patient was brought back to the operating room and placed supine upon the operating table.  The patient received IV antibiotics prior to induction.  After obtaining adequate anesthesia, the patient was prepped and draped in the standard fashion.  The wound was then opened and excisional debridement was performed to the skin, soft tissue, and muscle fascia to remove all clearly non-viable tissue.  The tissue was taken back to bleeding tissue that appeared viable.  The debridement was performed with Metzenbaum scissors and encompassed an area of approximately 15 cm2.  The wound was approximately 4 to 5 cm in length, 3 to 4 cm in width, and had about 2 to 3 cm in depth.  The wound was irrigated copiously with sterile saline.  After all clearly non-viable tissue was removed, celebrate was placed to help augment wound healing.  I then cut a negative pressure sponge and tucked it in the wound.  Strips of Ioban were used and a good occlusive seal was obtained when connected to suction tubing. This was a non-disposable wound VAC. The patient was then awakened from anesthesia and taken to the recovery room in stable condition having  tolerated the procedure well.  COMPLICATIONS: none  CONDITION: stable  Festus Barren  01/09/2023, 9:46 AM   This note was created with Dragon Medical transcription system. Any errors in dictation are purely unintentional.

## 2023-01-09 NOTE — Interval H&P Note (Signed)
History and Physical Interval Note:  01/09/2023 8:31 AM  Manuel [name]  has presented today for surgery, with the diagnosis of WOUND DEHISCENCE.  The various methods of treatment have been discussed with the patient and family. After consideration of risks, benefits and other options for treatment, the patient has consented to  Procedure(s): DEBRIDEMENT WOUND (Right) APPLICATION OF WOUND VAC (Right) as a surgical intervention.  The patient's history has been reviewed, patient examined, no change in status, stable for surgery.  I have reviewed the patient's chart and labs.  Questions were answered to the patient's satisfaction.     Festus Barren

## 2023-01-09 NOTE — Discharge Instructions (Signed)

## 2023-01-10 ENCOUNTER — Encounter: Payer: Self-pay | Admitting: Vascular Surgery

## 2023-01-10 NOTE — Anesthesia Postprocedure Evaluation (Signed)
Anesthesia Post Note  Patient: YOSHIHIRO TREMBATH  Procedure(s) Performed: DEBRIDEMENT WOUND (Right: Groin) APPLICATION OF WOUND VAC (Right: Groin)  Patient location during evaluation: PACU Anesthesia Type: General Level of consciousness: awake and alert Pain management: pain level controlled Vital Signs Assessment: post-procedure vital signs reviewed and stable Respiratory status: spontaneous breathing, nonlabored ventilation and respiratory function stable Cardiovascular status: blood pressure returned to baseline and stable Postop Assessment: no apparent nausea or vomiting Anesthetic complications: no   No notable events documented.   Last Vitals:  Vitals:   01/09/23 1015 01/09/23 1020  BP: (!) 143/65 (!) 122/90  Pulse: 64 (!) 106  Resp: 16 14  Temp: (!) 36.2 C   SpO2: 97% 96%    Last Pain:  Vitals:   01/09/23 1020  TempSrc:   PainSc: 0-No pain                 Foye Deer

## 2023-01-13 ENCOUNTER — Telehealth (INDEPENDENT_AMBULATORY_CARE_PROVIDER_SITE_OTHER): Payer: Self-pay

## 2023-01-13 NOTE — Telephone Encounter (Signed)
Verbal orders were received by Arline Asp with [facility] to start wound vac changes two times weekly.

## 2023-01-15 ENCOUNTER — Other Ambulatory Visit: Payer: Self-pay

## 2023-01-15 ENCOUNTER — Telehealth (INDEPENDENT_AMBULATORY_CARE_PROVIDER_SITE_OTHER): Payer: Self-pay

## 2023-01-15 ENCOUNTER — Inpatient Hospital Stay
Admission: EM | Admit: 2023-01-15 | Discharge: 2023-01-19 | DRG: 280 | Disposition: A | Discharge status: Home / Self Care | Payer: Medicare Other | Attending: Student | Admitting: Student

## 2023-01-15 ENCOUNTER — Emergency Department: Payer: Medicare Other

## 2023-01-15 DIAGNOSIS — E782 Mixed hyperlipidemia: Secondary | ICD-10-CM | POA: Diagnosis present

## 2023-01-15 DIAGNOSIS — Z7902 Long term (current) use of antithrombotics/antiplatelets: Secondary | ICD-10-CM

## 2023-01-15 DIAGNOSIS — Z95820 Peripheral vascular angioplasty status with implants and grafts: Secondary | ICD-10-CM

## 2023-01-15 DIAGNOSIS — R531 Weakness: Secondary | ICD-10-CM | POA: Diagnosis present

## 2023-01-15 DIAGNOSIS — Z515 Encounter for palliative care: Secondary | ICD-10-CM

## 2023-01-15 DIAGNOSIS — I5043 Acute on chronic combined systolic (congestive) and diastolic (congestive) heart failure: Secondary | ICD-10-CM | POA: Diagnosis present

## 2023-01-15 DIAGNOSIS — R7989 Other specified abnormal findings of blood chemistry: Secondary | ICD-10-CM

## 2023-01-15 DIAGNOSIS — E1169 Type 2 diabetes mellitus with other specified complication: Secondary | ICD-10-CM | POA: Diagnosis not present

## 2023-01-15 DIAGNOSIS — Z9842 Cataract extraction status, left eye: Secondary | ICD-10-CM

## 2023-01-15 DIAGNOSIS — Z7189 Other specified counseling: Secondary | ICD-10-CM | POA: Diagnosis not present

## 2023-01-15 DIAGNOSIS — Z961 Presence of intraocular lens: Secondary | ICD-10-CM | POA: Diagnosis present

## 2023-01-15 DIAGNOSIS — Z89421 Acquired absence of other right toe(s): Secondary | ICD-10-CM

## 2023-01-15 DIAGNOSIS — I739 Peripheral vascular disease, unspecified: Secondary | ICD-10-CM | POA: Diagnosis not present

## 2023-01-15 DIAGNOSIS — I429 Cardiomyopathy, unspecified: Secondary | ICD-10-CM | POA: Diagnosis present

## 2023-01-15 DIAGNOSIS — Z794 Long term (current) use of insulin: Secondary | ICD-10-CM

## 2023-01-15 DIAGNOSIS — I214 Non-ST elevation (NSTEMI) myocardial infarction: Secondary | ICD-10-CM | POA: Diagnosis present

## 2023-01-15 DIAGNOSIS — I2510 Atherosclerotic heart disease of native coronary artery without angina pectoris: Secondary | ICD-10-CM | POA: Diagnosis present

## 2023-01-15 DIAGNOSIS — L97909 Non-pressure chronic ulcer of unspecified part of unspecified lower leg with unspecified severity: Secondary | ICD-10-CM | POA: Diagnosis not present

## 2023-01-15 DIAGNOSIS — F1011 Alcohol abuse, in remission: Secondary | ICD-10-CM | POA: Diagnosis present

## 2023-01-15 DIAGNOSIS — G4733 Obstructive sleep apnea (adult) (pediatric): Secondary | ICD-10-CM | POA: Diagnosis present

## 2023-01-15 DIAGNOSIS — Z9841 Cataract extraction status, right eye: Secondary | ICD-10-CM

## 2023-01-15 DIAGNOSIS — L97919 Non-pressure chronic ulcer of unspecified part of right lower leg with unspecified severity: Secondary | ICD-10-CM | POA: Diagnosis present

## 2023-01-15 DIAGNOSIS — I21A1 Myocardial infarction type 2: Secondary | ICD-10-CM | POA: Diagnosis present

## 2023-01-15 DIAGNOSIS — I255 Ischemic cardiomyopathy: Secondary | ICD-10-CM | POA: Diagnosis present

## 2023-01-15 DIAGNOSIS — E11622 Type 2 diabetes mellitus with other skin ulcer: Secondary | ICD-10-CM | POA: Diagnosis present

## 2023-01-15 DIAGNOSIS — Z79899 Other long term (current) drug therapy: Secondary | ICD-10-CM | POA: Diagnosis not present

## 2023-01-15 DIAGNOSIS — E1151 Type 2 diabetes mellitus with diabetic peripheral angiopathy without gangrene: Secondary | ICD-10-CM | POA: Diagnosis present

## 2023-01-15 DIAGNOSIS — F32A Depression, unspecified: Secondary | ICD-10-CM | POA: Diagnosis present

## 2023-01-15 DIAGNOSIS — Z8249 Family history of ischemic heart disease and other diseases of the circulatory system: Secondary | ICD-10-CM

## 2023-01-15 DIAGNOSIS — I10 Essential (primary) hypertension: Secondary | ICD-10-CM | POA: Diagnosis not present

## 2023-01-15 DIAGNOSIS — Z7982 Long term (current) use of aspirin: Secondary | ICD-10-CM

## 2023-01-15 DIAGNOSIS — H9190 Unspecified hearing loss, unspecified ear: Secondary | ICD-10-CM | POA: Diagnosis present

## 2023-01-15 DIAGNOSIS — Z66 Do not resuscitate: Secondary | ICD-10-CM | POA: Diagnosis present

## 2023-01-15 DIAGNOSIS — D509 Iron deficiency anemia, unspecified: Secondary | ICD-10-CM | POA: Diagnosis present

## 2023-01-15 DIAGNOSIS — E1142 Type 2 diabetes mellitus with diabetic polyneuropathy: Secondary | ICD-10-CM | POA: Diagnosis present

## 2023-01-15 DIAGNOSIS — E871 Hypo-osmolality and hyponatremia: Secondary | ICD-10-CM | POA: Diagnosis present

## 2023-01-15 DIAGNOSIS — R040 Epistaxis: Secondary | ICD-10-CM | POA: Diagnosis not present

## 2023-01-15 DIAGNOSIS — I110 Hypertensive heart disease with heart failure: Secondary | ICD-10-CM | POA: Diagnosis present

## 2023-01-15 DIAGNOSIS — I5023 Acute on chronic systolic (congestive) heart failure: Secondary | ICD-10-CM | POA: Diagnosis not present

## 2023-01-15 DIAGNOSIS — I252 Old myocardial infarction: Secondary | ICD-10-CM

## 2023-01-15 DIAGNOSIS — F1010 Alcohol abuse, uncomplicated: Secondary | ICD-10-CM | POA: Diagnosis present

## 2023-01-15 DIAGNOSIS — Z955 Presence of coronary angioplasty implant and graft: Secondary | ICD-10-CM

## 2023-01-15 DIAGNOSIS — E119 Type 2 diabetes mellitus without complications: Secondary | ICD-10-CM

## 2023-01-15 HISTORY — DX: Non-ST elevation (NSTEMI) myocardial infarction: I21.4

## 2023-01-15 LAB — CBC WITH DIFFERENTIAL/PLATELET
Abs Immature Granulocytes: 0.1 10*3/uL — ABNORMAL HIGH (ref 0.00–0.07)
Basophils Absolute: 0.1 10*3/uL (ref 0.0–0.1)
Basophils Relative: 0 %
Eosinophils Absolute: 0.1 10*3/uL (ref 0.0–0.5)
Eosinophils Relative: 0 %
HCT: 35.7 % — ABNORMAL LOW (ref 39.0–52.0)
Hemoglobin: 11.7 g/dL — ABNORMAL LOW (ref 13.0–17.0)
Immature Granulocytes: 1 %
Lymphocytes Relative: 8 %
Lymphs Abs: 1.1 10*3/uL (ref 0.7–4.0)
MCH: 27.9 pg (ref 26.0–34.0)
MCHC: 32.8 g/dL (ref 30.0–36.0)
MCV: 85 fL (ref 80.0–100.0)
Monocytes Absolute: 1 10*3/uL (ref 0.1–1.0)
Monocytes Relative: 7 %
Neutro Abs: 11.7 10*3/uL — ABNORMAL HIGH (ref 1.7–7.7)
Neutrophils Relative %: 84 %
Platelets: 345 10*3/uL (ref 150–400)
RBC: 4.2 MIL/uL — ABNORMAL LOW (ref 4.22–5.81)
RDW: 12.9 % (ref 11.5–15.5)
WBC: 14 10*3/uL — ABNORMAL HIGH (ref 4.0–10.5)
nRBC: 0 % (ref 0.0–0.2)

## 2023-01-15 LAB — PROTIME-INR
INR: 1.3 — ABNORMAL HIGH (ref 0.8–1.2)
Prothrombin Time: 16 seconds — ABNORMAL HIGH (ref 11.4–15.2)

## 2023-01-15 LAB — COMPREHENSIVE METABOLIC PANEL
ALT: 12 U/L (ref 0–44)
AST: 18 U/L (ref 15–41)
Albumin: 3.1 g/dL — ABNORMAL LOW (ref 3.5–5.0)
Alkaline Phosphatase: 59 U/L (ref 38–126)
Anion gap: 9 (ref 5–15)
BUN: 23 mg/dL (ref 8–23)
CO2: 23 mmol/L (ref 22–32)
Calcium: 8.5 mg/dL — ABNORMAL LOW (ref 8.9–10.3)
Chloride: 97 mmol/L — ABNORMAL LOW (ref 98–111)
Creatinine, Ser: 0.93 mg/dL (ref 0.61–1.24)
GFR, Estimated: >60 mL/min (ref >60)
Glucose, Bld: 187 mg/dL — ABNORMAL HIGH (ref 70–99)
Potassium: 4.5 mmol/L (ref 3.5–5.1)
Sodium: 129 mmol/L — ABNORMAL LOW (ref 135–145)
Total Bilirubin: 1.2 mg/dL (ref 0.3–1.2)
Total Protein: 6.5 g/dL (ref 6.5–8.1)

## 2023-01-15 LAB — TROPONIN I (HIGH SENSITIVITY)
Troponin I (High Sensitivity): 431 ng/L (ref <18)
Troponin I (High Sensitivity): 475 ng/L (ref <18)

## 2023-01-15 LAB — BRAIN NATRIURETIC PEPTIDE: B Natriuretic Peptide: 1760.5 pg/mL — ABNORMAL HIGH (ref 0.0–100.0)

## 2023-01-15 LAB — LACTIC ACID, PLASMA
Lactic Acid, Venous: 1.4 mmol/L (ref 0.5–1.9)
Lactic Acid, Venous: 1.3 mmol/L (ref 0.5–1.9)

## 2023-01-15 LAB — CBG MONITORING, ED
Glucose-Capillary: 162 mg/dL — ABNORMAL HIGH (ref 70–99)
Glucose-Capillary: 217 mg/dL — ABNORMAL HIGH (ref 70–99)
Glucose-Capillary: 134 mg/dL — ABNORMAL HIGH (ref 70–99)

## 2023-01-15 LAB — APTT: aPTT: 32 seconds (ref 24–36)

## 2023-01-15 LAB — HEPARIN LEVEL (UNFRACTIONATED): Heparin Unfractionated: <0.1 IU/mL — ABNORMAL LOW (ref 0.30–0.70)

## 2023-01-15 MED ORDER — INSULIN ASPART 100 UNIT/ML IJ SOLN
3.0000 [IU] | Freq: Three times a day (TID) | INTRAMUSCULAR | Status: DC
  Administered 2023-01-15 – 2023-01-19 (×13): 3 [IU] via SUBCUTANEOUS
  Filled 2023-01-15 (×13): qty 1

## 2023-01-15 MED ORDER — SODIUM CHLORIDE 0.9 % IV SOLN: 250.0000 mL | INTRAVENOUS | Status: DC | PRN

## 2023-01-15 MED ORDER — FUROSEMIDE 10 MG/ML IJ SOLN
60.0000 mg | Freq: Two times a day (BID) | INTRAMUSCULAR | Status: DC
  Administered 2023-01-15 – 2023-01-16 (×3): 60 mg via INTRAVENOUS
  Filled 2023-01-15: qty 8
  Filled 2023-01-15 (×2): qty 6

## 2023-01-15 MED ORDER — SODIUM CHLORIDE 0.9% FLUSH: 3.0000 mL | INTRAVENOUS | Status: DC | PRN

## 2023-01-15 MED ORDER — ONDANSETRON HCL 4 MG PO TABS: 4.0000 mg | ORAL_TABLET | Freq: Four times a day (QID) | ORAL | Status: DC | PRN

## 2023-01-15 MED ORDER — ALUM & MAG HYDROXIDE-SIMETH 200-200-20 MG/5ML PO SUSP
30.0000 mL | Freq: Once | ORAL | Status: AC
  Administered 2023-01-15: 30 mL via ORAL
  Filled 2023-01-15: qty 30

## 2023-01-15 MED ORDER — HEPARIN (PORCINE) 25000 UT/250ML-% IV SOLN
1350.0000 [IU]/h | INTRAVENOUS | Status: DC
  Administered 2023-01-15: 1050 [IU]/h via INTRAVENOUS
  Administered 2023-01-16: 1350 [IU]/h via INTRAVENOUS
  Filled 2023-01-15 (×2): qty 250

## 2023-01-15 MED ORDER — INSULIN ASPART 100 UNIT/ML IJ SOLN
0.0000 [IU] | Freq: Three times a day (TID) | INTRAMUSCULAR | Status: DC
  Administered 2023-01-15: 3 [IU] via SUBCUTANEOUS
  Administered 2023-01-15: 2 [IU] via SUBCUTANEOUS
  Administered 2023-01-16: 1 [IU] via SUBCUTANEOUS
  Administered 2023-01-16 – 2023-01-17 (×2): 2 [IU] via SUBCUTANEOUS
  Administered 2023-01-17: 3 [IU] via SUBCUTANEOUS
  Administered 2023-01-18: 2 [IU] via SUBCUTANEOUS
  Administered 2023-01-18: 1 [IU] via SUBCUTANEOUS
  Administered 2023-01-18: 2 [IU] via SUBCUTANEOUS
  Administered 2023-01-19: 3 [IU] via SUBCUTANEOUS
  Administered 2023-01-19: 1 [IU] via SUBCUTANEOUS
  Filled 2023-01-15 (×11): qty 1

## 2023-01-15 MED ORDER — LOSARTAN POTASSIUM 25 MG PO TABS
25.0000 mg | ORAL_TABLET | Freq: Every day | ORAL | Status: DC
  Administered 2023-01-16 – 2023-01-19 (×4): 25 mg via ORAL
  Filled 2023-01-15 (×4): qty 1

## 2023-01-15 MED ORDER — CARVEDILOL 25 MG PO TABS
25.0000 mg | ORAL_TABLET | Freq: Two times a day (BID) | ORAL | Status: DC
  Administered 2023-01-15 – 2023-01-19 (×8): 25 mg via ORAL
  Filled 2023-01-15: qty 4
  Filled 2023-01-15 (×7): qty 1

## 2023-01-15 MED ORDER — MORPHINE SULFATE (PF) 4 MG/ML IV SOLN
6.0000 mg | Freq: Once | INTRAVENOUS | Status: AC
  Administered 2023-01-15: 6 mg via INTRAVENOUS
  Filled 2023-01-15: qty 2

## 2023-01-15 MED ORDER — FUROSEMIDE 10 MG/ML IJ SOLN
20.0000 mg | Freq: Once | INTRAMUSCULAR | Status: AC
  Administered 2023-01-15: 20 mg via INTRAVENOUS
  Filled 2023-01-15: qty 4

## 2023-01-15 MED ORDER — SODIUM CHLORIDE 0.9% FLUSH
3.0000 mL | Freq: Two times a day (BID) | INTRAVENOUS | Status: DC
  Administered 2023-01-15 – 2023-01-16 (×3): 3 mL via INTRAVENOUS

## 2023-01-15 MED ORDER — ONDANSETRON HCL 4 MG/2ML IJ SOLN
4.0000 mg | Freq: Once | INTRAMUSCULAR | Status: AC
  Administered 2023-01-15: 4 mg via INTRAVENOUS
  Filled 2023-01-15: qty 2

## 2023-01-15 MED ORDER — ATORVASTATIN CALCIUM 80 MG PO TABS
80.0000 mg | ORAL_TABLET | Freq: Every day | ORAL | Status: DC
  Administered 2023-01-15 – 2023-01-18 (×4): 80 mg via ORAL
  Filled 2023-01-15: qty 4
  Filled 2023-01-15 (×3): qty 1

## 2023-01-15 MED ORDER — HEPARIN BOLUS VIA INFUSION
4000.0000 [IU] | Freq: Once | INTRAVENOUS | Status: AC
  Administered 2023-01-15: 4000 [IU] via INTRAVENOUS
  Filled 2023-01-15: qty 4000

## 2023-01-15 MED ORDER — HYDROMORPHONE HCL 1 MG/ML IJ SOLN
1.0000 mg | INTRAMUSCULAR | Status: DC | PRN
  Administered 2023-01-15 (×2): 1 mg via INTRAVENOUS
  Filled 2023-01-15 (×2): qty 1

## 2023-01-15 MED ORDER — INSULIN ASPART 100 UNIT/ML IJ SOLN
0.0000 [IU] | Freq: Every day | INTRAMUSCULAR | Status: DC
  Administered 2023-01-17: 2 [IU] via SUBCUTANEOUS
  Filled 2023-01-15 (×2): qty 1

## 2023-01-15 MED ORDER — GABAPENTIN 400 MG PO CAPS
800.0000 mg | ORAL_CAPSULE | Freq: Two times a day (BID) | ORAL | Status: DC
  Administered 2023-01-15 – 2023-01-19 (×8): 800 mg via ORAL
  Filled 2023-01-15 (×8): qty 2

## 2023-01-15 MED ORDER — HEPARIN BOLUS VIA INFUSION
2500.0000 [IU] | Freq: Once | INTRAVENOUS | Status: AC
  Administered 2023-01-15: 2500 [IU] via INTRAVENOUS
  Filled 2023-01-15: qty 2500

## 2023-01-15 MED ORDER — ONDANSETRON HCL 4 MG/2ML IJ SOLN
4.0000 mg | Freq: Four times a day (QID) | INTRAMUSCULAR | Status: DC | PRN
  Administered 2023-01-15: 4 mg via INTRAVENOUS
  Filled 2023-01-15: qty 2

## 2023-01-15 MED ORDER — CLOPIDOGREL BISULFATE 75 MG PO TABS
75.0000 mg | ORAL_TABLET | Freq: Every day | ORAL | Status: DC
  Administered 2023-01-15 – 2023-01-19 (×5): 75 mg via ORAL
  Filled 2023-01-15 (×5): qty 1

## 2023-01-15 MED ORDER — GABAPENTIN 300 MG PO CAPS: 800.0000 mg | ORAL_CAPSULE | Freq: Two times a day (BID) | ORAL | Status: DC

## 2023-01-15 NOTE — Assessment & Plan Note (Signed)
Patient with worsening pain despite oral Neurontin regimen Will bridge with IV pain medication for now Consider discussion with vascular surgery to better assess pain regimen in the setting of PAD Follow-up

## 2023-01-15 NOTE — Assessment & Plan Note (Addendum)
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 Recent wound debridement by vascular surgery September 19 Wound VAC in place and appears to be stable-CDI  Will otherwise monitor for now On heparin drip in the setting of NSTEMI Continue statin Vascular surgery consult as clinically indicated

## 2023-01-15 NOTE — Evaluation (Signed)
Occupational Therapy Evaluation Patient Details Name: Manuel [name] MRN: [id number] DOB: March 14, 1946 Today's Date: 01/15/2023   History of Present Illness Patient is a 77 y.o. 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  Patient reports worsening shortness of breath, increased work of breathing over the past 4 to 5 days. Current MD assessment:  NSTEMI, Acute on chronic combined systolic and diastolic CHF, Ischemic ulcer of lower extremity, and T2DM.   Clinical Impression   Chart reviewed to date, pt greeted in room after PT session seated on edge of bed, wife present throughout evaluation. PTA pt had increased difficulties with LB dressing due to RLE decrease ease of ROM/mobility, generally performing other ADLs with MOD I, assist for IADLs from wife as needed. Pt was walking down the stairs to his laundry room multiple times a day per report. Pt presents with deficits in strength, endurance, activity tolerance, balance, RLE function affecting safe and optimal ADL completion. Pt would benefit from ongoing OT to address deficits and to facilitate optimal ADL completion. OT will follow acutely.     If plan is discharge home, recommend the following: A little help with walking and/or transfers;A little help with bathing/dressing/bathroom;Assist for transportation;Help with stairs or ramp for entrance    Functional Status Assessment  Patient has had a recent decline in their functional status and demonstrates the ability to make significant improvements in function in a reasonable and predictable amount of time.  Equipment Recommendations  None recommended by OT    Recommendations for Other  Services       Precautions / Restrictions Precautions Precautions: Fall;Other (comment) Restrictions Weight Bearing Restrictions: Yes Other Position/Activity Restrictions: per secure chat with podiatry "wedge surgical shoe. Pressure only on the heel when walking"      Mobility Bed Mobility Overal bed mobility: Needs Assistance Bed Mobility: Sit to Supine       Sit to supine: Contact guard assist, HOB elevated   General bed mobility comments: seated on edge of bed pre session, increased time, CGA for return to bed with frequent vcs for technique    Transfers Overall transfer level: Needs assistance Equipment used: Rolling walker (2 wheels) Transfers: Sit to/from Stand Sit to Stand: Contact guard assist                  Balance Overall balance assessment: Needs assistance Sitting-balance support: Feet supported Sitting balance-Leahy Scale: Good     Standing balance support: Bilateral upper extremity supported, During functional activity, Reliant on assistive device for balance Standing balance-Leahy Scale: Fair                             ADL either performed or assessed with clinical judgement   ADL Overall ADL's : Needs assistance/impaired Eating/Feeding: Set up;Sitting   Grooming: Sitting;Set up Grooming Details (indicate cue type and reason): anticipate         Upper Body Dressing : Set up;Sitting Upper Body Dressing Details (indicate cue type and reason): anticipate Lower Body Dressing: Moderate assistance;Sit to/from stand Lower Body Dressing Details (indicate cue type and reason): shorts Toilet Transfer: Contact guard assist;Rolling walker (2 wheels);Ambulation Toilet Transfer Details (indicate cue type and reason): simulated Toileting-  Clothing Manipulation and Hygiene: Moderate assistance Toileting - Clothing Manipulation Details (indicate cue type and reason): shorts, difficulties with buttons     Functional mobility during ADLs:  Contact guard assist;Rolling walker (2 wheels) (approx 8' in room, intermittent vcs for pacing)       Vision Patient Visual Report: No change from baseline       Perception         Praxis         Pertinent Vitals/Pain Pain Assessment Pain Assessment: No/denies pain     Extremity/Trunk Assessment Upper Extremity Assessment Upper Extremity Assessment: Overall WFL for tasks assessed   Lower Extremity Assessment Lower Extremity Assessment: RLE deficits/detail RLE Deficits / Details: RLE edema noted throughout, generalized weakness and decreased ease of AROM due to edema; wound vac in place       Communication Communication Communication: No apparent difficulties Cueing Techniques: Verbal cues;Tactile cues   Cognition Arousal: Alert Behavior During Therapy: WFL for tasks assessed/performed Overall Cognitive Status: Within Functional Limits for tasks assessed Area of Impairment: Problem solving                             Problem Solving: Requires verbal cues       General Comments  spo2 >95% on RA, HR in the 80s pre/post mobility; BP 152/88, HR 87 post session; wound vac intact pre/post session    Exercises Other Exercises Other Exercises: edu re: role of OT, role of rehab, discharge recommendations, home safety, falls prevention, DME use for ADL completion, WBing precautions   Shoulder Instructions      Home Living Family/patient expects to be discharged to:: Private residence Living Arrangements: Spouse/significant other Available Help at Discharge: Family;Available 24 hours/day Type of Home: House Home Access: Ramped entrance     Home Layout: Multi-level;Laundry or work area in Artist of Steps: flight   Bathroom Shower/Tub: Tub/shower unit         Home Equipment: Agricultural consultant (2 wheels);BSC/3in1;Shower seat          Prior Functioning/Environment Prior Level of Function : Independent/Modified  Independent;Driving             Mobility Comments: amb with no AD/ PRN use of RW/ crutches household distances ADLs Comments: pt reports increased difficulties with LB dressing (shorts) with RLE decrease in mobility, indep in feeding/grooming; sponge baths currently with MOD I; toileting with MOD I; assist for cooking, cleaning from wife; Pt was driving PTA        OT Problem List: Decreased activity tolerance;Decreased knowledge of use of DME or AE;Impaired balance (sitting and/or standing);Decreased strength      OT Treatment/Interventions: Self-care/ADL training;DME and/or AE instruction;Therapeutic activities;Therapeutic exercise;Balance training;Energy conservation;Patient/family education    OT Goals(Current goals can be found in the care plan section) Acute Rehab OT Goals Patient Stated Goal: decrease pain OT Goal Formulation: With patient Time For Goal Achievement: 01/29/23 Potential to Achieve Goals: Good ADL Goals Pt Will Perform Grooming: with modified independence;standing Pt Will Perform Lower Body Dressing: with modified independence;sit to/from stand Pt Will Transfer to Toilet: with modified independence;ambulating Pt Will Perform Toileting - Clothing Manipulation and hygiene: with modified independence;sit to/from stand  OT Frequency: Min 1X/week    Co-evaluation              AM-PAC OT "6 Clicks" Daily Activity     Outcome Measure Help from another person eating meals?: None Help from another person taking  care of personal grooming?: None Help from another person toileting, which includes using toliet, bedpan, or urinal?: A Little Help from another person bathing (including washing, rinsing, drying)?: A Little Help from another person to put on and taking off regular upper body clothing?: None Help from another person to put on and taking off regular lower body clothing?: A Little 6 Click Score: 21   End of Session Equipment Utilized During Treatment:  Rolling walker (2 wheels) Nurse Communication: Mobility status (vitals)  Activity Tolerance: Patient tolerated treatment well Patient left: in bed;with call bell/phone within reach;with family/visitor present  OT Visit Diagnosis: Other abnormalities of gait and mobility (R26.89)                Time: 2130-8657 OT Time Calculation (min): 26 min Charges:  OT General Charges $OT Visit: 1 Visit OT Evaluation $OT Eval Moderate Complexity: 1 Mod  Oleta Mouse, OTD OTR/L  01/15/23, 3:35 PM

## 2023-01-15 NOTE — Telephone Encounter (Signed)
A message was left on voicemail requesting pain medication for the patient. The patient is currently at the hospital. I left a message informing that will need to speak with charge nurse for pain medication request.

## 2023-01-15 NOTE — ED Notes (Signed)
Patient placed on 2L nasal cannula per Erma Heritage, MD

## 2023-01-15 NOTE — Consult Note (Signed)
ANTICOAGULATION CONSULT NOTE - Initial Consult  Pharmacy Consult for Heparin Infusion Indication: chest pain/ACS  No Known Allergies  Patient Measurements: Height: 5\' 8"  (172.7 cm) Weight: 84.4 kg (186 lb) IBW/kg (Calculated) : 68.4 Heparin Dosing Weight: 84.4 kg  Vital Signs: Temp: 98.9 F (37.2 C) (09/25 0721) Temp Source: Oral (09/25 0721) BP: 144/83 (09/25 0930) Pulse Rate: 74 (09/25 0930)  Labs: Recent Labs    01/15/23 0754  HGB 11.7*  HCT 35.7*  PLT 345  CREATININE 0.93  TROPONINIHS 431*    Estimated Creatinine Clearance: 68.1 mL/min (by C-G formula based on SCr of 0.93 mg/dL).   Medical History: Past Medical History:  Diagnosis Date   Acute ST elevation myocardial infarction (STEMI) of inferior wall (HCC) 04/19/2010   a.) transfered from [facility] to [facility] --> LHC/PCI (very difficult procedure) --> 3.0 x 23 mm and 3.0 x 12 mm Xience stents to RCA   Allergies    Arthritis    Benign essential hypertension    Bilateral carotid artery disease (HCC) 05/08/2021   a.) carotid doppler 05/08/2021: 1-39% BICA   CAD (coronary artery disease) 04/19/2010   a.) inferior STEMI 04/19/2010 --> LHC/PCI: 50-70% pD1, 80% pRI, 90/90/90% RCA (overlapping 3.0 x 23 and 3.0 x 12 mm Xience DES); b.) MV 11/10/2018: fixed minimally reversible inferior perfusion defect   Cellulitis of foot    DDD (degenerative disc disease), cervical    Diabetes mellitus type 2, insulin dependent (HCC)    Diverticulosis    Full dentures    Gout    Hard of hearing    History of bilateral cataract extraction 2022   History of ETOH abuse    Hyperlipidemia    Ischemic cardiomyopathy 04/19/2010   a.) TTE 04/19/2010: 40%; b.) TTE 04/20/2014: EF >55%, mild RVE, triv PR, mild MR/TR, G1DD; c.) TTE 11/10/2018: EF 45%, inf HK, mild RVE, triv TR/PR, mild MR, G1DD; d.) TTE 11/14/2022: EF 25-30%, basal anteroseptal, apical lateral, apical septal, and apex AK, mid anterolateral HK, mild-mod MR/TR   Long term current  use of aspirin    Long term current use of clopidogrel    Lumbar degenerative disc disease    Lumbar radiculopathy    Lumbar vertebral fracture (chronic superior endplate of L1)    OSA (obstructive sleep apnea)    a.) unable to tolerate nocturnal PAP therapy   Peripheral artery disease (HCC)    a.) stenting 05/14/21: 12 mm x 12 cm LifeStent RIGHT dis SFA/prox pop; b.) s/p cath directed thrombolysis RIGHT SFA/pop 06/14/21; c.) s/p mech thrombectomy + stenting 06/15/21: 8 mm x 25cm & 8 mm x 7.5cm Viabahn; d.) s/p BILAT CFA, profunda femoris, SFA endarterectomies + fogarty embolectomy + stenting 11/15/22: 12mm x 58mm Lifestream BILAT CIAs, 14 mm x 6 cm Lifestream & 13 mm x 5 cm Viabahn LEFT EIA   Peripheral neuropathy    Umbilical hernia    Assessment: Manuel [name] is a 77 y.o. male presenting with chest pain. PMH significant for CAD, T2DM, HLD, neuropathy, PVD. Patient was not on [facility] PTA per chart review. Pharmacy has been consulted to initiate and manage heparin infusion.   Baseline Labs: aPTT 32, PT 16.0, INR 1.3, Hgb 11.7, Hct 35.7, Plt 345   Goal of Therapy:  Heparin level 0.3-0.7 units/ml Monitor platelets by anticoagulation protocol: Yes   Plan:  Give 4000 units bolus x 1 Start heparin infusion at 1050 units/hr Check HL in 8 hours  Continue to monitor H&H and platelets daily while on  heparin infusion   Celene Squibb, PharmD Clinical Pharmacist 01/15/2023 10:07 AM

## 2023-01-15 NOTE — Assessment & Plan Note (Signed)
BP stable Titrate home regimen 

## 2023-01-15 NOTE — ED Provider Notes (Signed)
[facility] Provider Note    Event Date/Time   First MD Initiated Contact with Patient 01/15/23 [phone]     (approximate)   History   Abdominal Pain   HPI  Manuel [name] is a 77 y.o. male  here with multiple complaints. Pt's primary complaint is bilateral but R>L leg pain, which is sharp, stabbing, and severe. 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 He says his wound vac was replaced yesterday and is now tighter-fitting and suctioning appropriately. Denies any increased drainage from this. He reports that over the past 2 days, his pain has been progressively worsening along with SOB that developed over past 24 hours. He states any time he tried to lie down to sleep last night he felt SOB with gasping. He was unable to sleep all night.   Physical Exam   Triage Vital Signs: ED Triage Vitals  Encounter Vitals Group     BP 01/15/23 0721 (!) 148/84     Systolic BP Percentile --      Diastolic BP Percentile --      Pulse Rate 01/15/23 0721 93     Resp 01/15/23 0721 20     Temp 01/15/23 0721 98.9 F (37.2 C)     Temp Source 01/15/23 0721 Oral     SpO2 01/15/23 0721 93 %     Weight 01/15/23 0724 186 lb (84.4 kg)     Height 01/15/23 0724 5\' 8"  (1.727 m)     Head Circumference --      Peak Flow --      Pain Score 01/15/23 0724 10     Pain Loc --      Pain Education --      Exclude from Growth Chart --     Most recent vital signs: Vitals:   01/15/23 1717 01/15/23 1730  BP:  (!) 154/80  Pulse:  94  Resp:  18  Temp: 98.9 F (37.2 C)   SpO2:  95%     General: Awake, no distress.  CV:  Good peripheral perfusion. RRR. No m/r/g. Resp:  Normal work of breathing. Bilateral rales noted with diminished aeration. Abd:  No distention. No tenderness. Other:  R groin wound vac in place over inguinal  site, no surrounding redness, drainage. 1+ DP pulses bilaterally, toes warm and well perfused. Slight asymmetric edema of R>L leg. Minimal diffuse tenderness to bilateral legs, compartments are soft. No cyanosis.   ED Results / Procedures / Treatments   Labs (all labs ordered are listed, but only abnormal results are displayed) Labs Reviewed  CBC WITH DIFFERENTIAL/PLATELET - Abnormal; Notable for the following components:      Result Value   WBC 14.0 (*)    RBC 4.20 (*)    Hemoglobin 11.7 (*)    HCT 35.7 (*)    Neutro Abs 11.7 (*)    Abs Immature Granulocytes 0.10 (*)    All other components within normal limits  COMPREHENSIVE METABOLIC PANEL - Abnormal; Notable for the following components:   Sodium 129 (*)    Chloride 97 (*)    Glucose, Bld 187 (*)    Calcium 8.5 (*)    Albumin 3.1 (*)    All other components within normal limits  BRAIN NATRIURETIC PEPTIDE - Abnormal; Notable for the following components:   B Natriuretic Peptide  1,760.5 (*)    All other components within normal limits  PROTIME-INR - Abnormal; Notable for the following components:   Prothrombin Time 16.0 (*)    INR 1.3 (*)    All other components within normal limits  CBG MONITORING, ED - Abnormal; Notable for the following components:   Glucose-Capillary 162 (*)    All other components within normal limits  CBG MONITORING, ED - Abnormal; Notable for the following components:   Glucose-Capillary 217 (*)    All other components within normal limits  TROPONIN I (HIGH SENSITIVITY) - Abnormal; Notable for the following components:   Troponin I (High Sensitivity) 431 (*)    All other components within normal limits  TROPONIN I (HIGH SENSITIVITY) - Abnormal; Notable for the following components:   Troponin I (High Sensitivity) 475 (*)    All other components within normal limits  LACTIC ACID, PLASMA  LACTIC ACID, PLASMA  APTT  HEPARIN LEVEL (UNFRACTIONATED)  CBC  COMPREHENSIVE METABOLIC PANEL      EKG Normal sinus rhythm, VR 94. PR 175, QRS 103, QTc 477. No acute ST elevations or depressions. No ischemia or infarct.   RADIOLOGY CXR: Bilateral vascular vongestion   I also independently reviewed and agree with radiologist interpretations.   PROCEDURES:  Critical Care performed: No  .1-3 Lead EKG Interpretation  Performed by: Shaune Pollack, MD Authorized by: Shaune Pollack, MD     Interpretation: normal     ECG rate:  80-100   ECG rate assessment: normal     Rhythm: sinus rhythm     Ectopy: none     Conduction: normal   Comments:     Indication: SOB     MEDICATIONS ORDERED IN ED: Medications  heparin ADULT infusion 100 units/mL (25000 units/230mL) (1,050 Units/hr Intravenous Rate/Dose Verify 01/15/23 1923)  sodium chloride flush (NS) 0.9 % injection 3 mL (3 mLs Intravenous Not Given 01/15/23 1126)  sodium chloride flush (NS) 0.9 % injection 3 mL (has no administration in time range)  0.9 %  sodium chloride infusion (has no administration in time range)  ondansetron (ZOFRAN) tablet 4 mg ( Oral See Alternative 01/15/23 1538)    Or  ondansetron (ZOFRAN) injection 4 mg (4 mg Intravenous Given 01/15/23 1538)  HYDROmorphone (DILAUDID) injection 1 mg (1 mg Intravenous Given 01/15/23 1538)  insulin aspart (novoLOG) injection 0-9 Units (3 Units Subcutaneous Given 01/15/23 1756)  insulin aspart (novoLOG) injection 0-5 Units (has no administration in time range)  insulin aspart (novoLOG) injection 3 Units (3 Units Subcutaneous Given 01/15/23 1757)  furosemide (LASIX) injection 60 mg (60 mg Intravenous Given 01/15/23 1538)  carvedilol (COREG) tablet 25 mg (25 mg Oral Given 01/15/23 1756)  atorvastatin (LIPITOR) tablet 80 mg (has no administration in time range)  clopidogrel (PLAVIX) tablet 75 mg (75 mg Oral Given 01/15/23 1756)  losartan (COZAAR) tablet 25 mg (has no administration in time range)  morphine (PF) 4 MG/ML injection 6 mg (6 mg Intravenous Given 01/15/23 0749)   ondansetron (ZOFRAN) injection 4 mg (4 mg Intravenous Given 01/15/23 0748)  furosemide (LASIX) injection 20 mg (20 mg Intravenous Given 01/15/23 0920)  heparin bolus via infusion 4,000 Units (4,000 Units Intravenous Bolus from Bag 01/15/23 1035)  alum & mag hydroxide-simeth (MAALOX/MYLANTA) 200-200-20 MG/5ML suspension 30 mL (30 mLs Oral Given 01/15/23 1923)     IMPRESSION / MDM / ASSESSMENT AND PLAN / ED COURSE  I reviewed the triage vital signs and the nursing notes.  Differential diagnosis includes, but is not limited to, CHF exacerbation, peripheral edema complicating peripheral neuropathy, PVD, limb ischemia, acute on chronic neuropathy  Patient's presentation is most consistent with acute presentation with potential threat to life or bodily function.  The patient is on the cardiac monitor to evaluate for evidence of arrhythmia and/or significant heart rate changes  77 yo M with PMHx CHF, PVD here with SOB, diffuse pain. Pt appears hypervolemic, has bilateral rales, mild hypoxia and CXR c/f edema. Suspect CHF exacerbation. EKG shows no ST elevations but trop >400, concerning for possible demand-related ischemia. Will start heparin. Pt has known severe PVD and CAD. BNP 1760. CXR with vascular congestion.  Admit to medicine.     FINAL CLINICAL IMPRESSION(S) / ED DIAGNOSES   Final diagnoses:  Acute on chronic systolic congestive heart failure (HCC)  Elevated troponin     Rx / DC Orders   ED Discharge Orders     None        Note:  This document was prepared using Dragon voice recognition software and may include unintentional dictation errors.   Shaune Pollack, MD 01/15/23 [phone]

## 2023-01-15 NOTE — Assessment & Plan Note (Signed)
Noted prior hx/o ETOH use  No reported active ETOH use at present  Monitor

## 2023-01-15 NOTE — Assessment & Plan Note (Signed)
SSI  A1C

## 2023-01-15 NOTE — H&P (Signed)
History and Physical    Patient: Manuel [name] ZOX:[id number] DOB: 09-27-1945 DOA: 01/15/2023 DOS: the patient was seen and examined on 01/15/2023 PCP: Gracelyn Nurse, MD  Patient coming from: Home  Chief Complaint:  Chief Complaint  Patient presents with   Abdominal Pain   HPI: Manuel [name] is a 77 y.o. male with medical history significant of HFrEF, insulin-dependent diabetes, hypertension, CAD, PAD status post angioplasty with stent 2023 as well as recent admission for lower limb ischemia with multiple vascular procedures July through August 2024 presenting with acute on chronic HFrEF.  Patient reports worsening shortness of breath, increased work of breathing over the past 4 to 5 days.  Does also report neuropathic pain with symptoms being poorly controlled with Neurontin.  Positive orthopnea, PND.  Does report?  Partial compliance with diuretic regimen.  No reported high salt or NSAID intake.  Prior alcohol use.  No reported active alcohol use at present.  No abdominal pain.  No diar white count 14, hemoglobin 11.7, platelets 345, troponin 430s to 470s.  Creatinine 0.93.  Glucose 180s.  BNP is 1760.  Chest x-ray positive cardiomegaly.  Rhea.  Recently had I&D done with vascular surgery September 19.  Wound VAC has been stable. Presented to the ER afebrile, hemodynamically stable.  Satting well on room air. Review of Systems: As mentioned in the history of present illness. All other systems reviewed and are negative. Past Medical History:  Diagnosis Date   Acute ST elevation myocardial infarction (STEMI) of inferior wall (HCC) 04/19/2010   a.) transfered from [facility] to [facility] --> LHC/PCI (very difficult procedure) --> 3.0 x 23 mm and 3.0 x 12 mm Xience stents to RCA   Allergies    Arthritis    Benign essential hypertension    Bilateral carotid artery disease (HCC) 05/08/2021   a.) carotid doppler 05/08/2021: 1-39% BICA   CAD (coronary artery disease) 04/19/2010   a.) inferior STEMI  04/19/2010 --> LHC/PCI: 50-70% pD1, 80% pRI, 90/90/90% RCA (overlapping 3.0 x 23 and 3.0 x 12 mm Xience DES); b.) MV 11/10/2018: fixed minimally reversible inferior perfusion defect   Cellulitis of foot    DDD (degenerative disc disease), cervical    Diabetes mellitus type 2, insulin dependent (HCC)    Diverticulosis    Full dentures    Gout    Hard of hearing    History of bilateral cataract extraction 2022   History of ETOH abuse    Hyperlipidemia    Ischemic cardiomyopathy 04/19/2010   a.) TTE 04/19/2010: 40%; b.) TTE 04/20/2014: EF >55%, mild RVE, triv PR, mild MR/TR, G1DD; c.) TTE 11/10/2018: EF 45%, inf HK, mild RVE, triv TR/PR, mild MR, G1DD; d.) TTE 11/14/2022: EF 25-30%, basal anteroseptal, apical lateral, apical septal, and apex AK, mid anterolateral HK, mild-mod MR/TR   Long term current use of aspirin    Long term current use of clopidogrel    Lumbar degenerative disc disease    Lumbar radiculopathy    Lumbar vertebral fracture (chronic superior endplate of L1)    OSA (obstructive sleep apnea)    a.) unable to tolerate nocturnal PAP therapy   Peripheral artery disease (HCC)    a.) stenting 05/14/21: 12 mm x 12 cm LifeStent RIGHT dis SFA/prox pop; b.) s/p cath directed thrombolysis RIGHT SFA/pop 06/14/21; c.) s/p mech thrombectomy + stenting 06/15/21: 8 mm x 25cm & 8 mm x 7.5cm Viabahn; d.) s/p BILAT CFA, profunda femoris, SFA endarterectomies + fogarty embolectomy + stenting 11/15/22: 12mm  x 58mm Lifestream BILAT CIAs, 14 mm x 6 cm Lifestream & 13 mm x 5 cm Viabahn LEFT EIA   Peripheral neuropathy    Umbilical hernia    Past Surgical History:  Procedure Laterality Date   AMPUTATION Right 11/18/2022   Procedure: AMPUTATION 4TH AND 5TH RAY;  Surgeon: Linus Galas, [name];  Location: [facility] ORS;  Service: Orthopedics/Podiatry;  Laterality: Right;  4th and 5th toe   APPLICATION OF WOUND VAC Right 01/09/2023   Procedure: APPLICATION OF WOUND VAC;  Surgeon: Annice Needy, MD;  Location: [facility]  ORS;  Service: Vascular;  Laterality: Right;   CATARACT EXTRACTION W/PHACO Right 03/14/2021   Procedure: CATARACT EXTRACTION PHACO AND INTRAOCULAR LENS PLACEMENT (IOC) RIGHT DIABETIC;  Surgeon: Lockie Mola, MD;  Location: [facility];  Service: Ophthalmology;  Laterality: Right;  Diabetic 16.78 01:39.9   CATARACT EXTRACTION W/PHACO Left 03/28/2021   Procedure: CATARACT EXTRACTION PHACO AND INTRAOCULAR LENS PLACEMENT (IOC) LEFT DIABETIC 6.93 01:22.0;  Surgeon: Lockie Mola, MD;  Location: [facility];  Service: Ophthalmology;  Laterality: Left;  Diabetic   COLONOSCOPY     CORONARY ANGIOPLASTY WITH STENT PLACEMENT  03/2010   Procedure: CORONARY ANGIOPLASTY WITH STENT PLACEMENT; Location: [name]   ENDARTERECTOMY FEMORAL Bilateral 11/15/2022   Procedure: BILATERAL COMMON FEMORAL PROFUNDA FEMORIS AND SUPERFICIAL FEMORAL ARTERY ENDARTECTOMIES, RIGHT FOGARTY EMBOLECTOMY OF THE RIGHT SFA  AND  POPLITEAL ARTERIES. AORTAGRAM AND RIGHT LOWER EXTREMITY ANGIOGRAM.;  Surgeon: Annice Needy, MD;  Location: [facility] ORS;  Service: Vascular;  Laterality: Bilateral;   INSERTION OF ILIAC STENT Bilateral 11/15/2022   Procedure: BILATERAL STENT INSERTION IN BILATERAL  COMMON ILIAC ARTERY, STENT INSERTION OF LEFT EXTERNAL ILIAC ARTERY. ANGIOPLASTY RIGHT TIBIAL  AND POPLITEAL ARTERY.;  Surgeon: Annice Needy, MD;  Location: [facility] ORS;  Service: Vascular;  Laterality: Bilateral;   LOWER EXTREMITY ANGIOGRAPHY Right 05/14/2021   Procedure: LOWER EXTREMITY ANGIOGRAPHY;  Surgeon: Annice Needy, MD;  Location: [facility] IN[facility];  Service: Cardiovascular;  Laterality: Right;   LOWER EXTREMITY ANGIOGRAPHY Right 06/14/2021   Procedure: Lower Extremity Angiography;  Surgeon: Annice Needy, MD;  Location: [facility];  Service: Cardiovascular;  Laterality: Right;   LOWER EXTREMITY ANGIOGRAPHY Right 11/11/2022   Procedure: Lower Extremity Angiography;  Surgeon: Annice Needy, MD;  Location: [facility]  INVASIVE CV [facility];  Service: Cardiovascular;  Laterality: Right;   LOWER EXTREMITY INTERVENTION Right 06/15/2021   Procedure: LOWER EXTREMITY INTERVENTION;  Surgeon: Annice Needy, MD;  Location: [facility];  Service: Cardiovascular;  Laterality: Right;   TONSILLECTOMY     WOUND DEBRIDEMENT Right 01/09/2023   Procedure: DEBRIDEMENT WOUND;  Surgeon: Annice Needy, MD;  Location: [facility] ORS;  Service: Vascular;  Laterality: Right;   Social History:  reports that he has never smoked. He has never used smokeless tobacco. He reports current alcohol use of about 21.0 standard drinks of alcohol per week. He reports that he does not use drugs.  No Known Allergies  Family History  Problem Relation Age of Onset   Scoliosis Mother    Heart disease Father     Prior to Admission medications   Medication Sig Start Date End Date Taking? Authorizing Provider  aspirin EC 81 MG tablet Take 1 tablet (81 mg total) by mouth daily. 11/26/22  Yes Delfino Lovett, MD  atorvastatin (LIPITOR) 80 MG tablet Take 1 tablet (80 mg total) by mouth daily. Patient taking differently: Take 80 mg by mouth at bedtime. 11/26/22  Yes Sherryll Burger, Vipul,  MD  carvedilol (COREG) 25 MG tablet Take 25 mg by mouth 2 (two) times daily with a meal.   Yes [provider]  clopidogrel (PLAVIX) 75 MG tablet Take 1 tablet (75 mg total) by mouth daily. 09/18/22  Yes Georgiana Spinner, NP  fluticasone (FLONASE) 50 MCG/ACT nasal spray Place 1 spray into both nostrils daily as needed for allergies or rhinitis.   Yes [provider]  gabapentin (NEURONTIN) 400 MG capsule Take 800 mg by mouth 2 (two) times daily. 12/06/21  Yes [provider]  insulin aspart protamine- aspart (NOVOLOG MIX 70/30) (70-30) 100 UNIT/ML injection Inject 30 Units into the skin 2 (two) times daily with a meal.   Yes [provider]  levocetirizine (XYZAL) 5 MG tablet Take 5 mg by mouth every evening.   Yes [provider]  senna-docusate  (SENOKOT-S) 8.6-50 MG tablet Take 1 tablet by mouth at bedtime as needed for mild constipation. 06/17/21  Yes Burnadette Pop, MD  spironolactone (ALDACTONE) 50 MG tablet Take 1 tablet (50 mg total) by mouth daily. 11/26/22 01/01/24 Yes Delfino Lovett, MD  Ensure Max Protein (ENSURE MAX PROTEIN) LIQD Take 330 mLs (11 oz total) by mouth 2 (two) times daily. Patient taking differently: Take 11 oz by mouth daily. 11/26/22   Delfino Lovett, MD  nystatin (MYCOSTATIN/NYSTOP) powder Apply 1 Application topically 3 (three) times daily. Patient not taking: Reported on 01/09/2023 12/12/22   Georgiana Spinner, NP    Physical Exam: Vitals:   01/15/23 0721 01/15/23 0724 01/15/23 0800 01/15/23 0930  BP: (!) 148/84  135/82 (!) 144/83  Pulse: 93  88 74  Resp: 20  18 18   Temp: 98.9 F (37.2 C)     TempSrc: Oral     SpO2: 93%  90% 97%  Weight:  84.4 kg    Height:  5\' 8"  (1.727 m)     Physical Exam HENT:     Head: Normocephalic and atraumatic.     Nose: Nose normal.     Mouth/Throat:     Mouth: Mucous membranes are moist.  Eyes:     Pupils: Pupils are equal, round, and reactive to light.  Cardiovascular:     Rate and Rhythm: Normal rate and regular rhythm.  Pulmonary:     Effort: Pulmonary effort is normal.  Abdominal:     General: Bowel sounds are normal.  Musculoskeletal:     Right lower leg: Edema present.     Left lower leg: Edema present.     Comments: Wound vac stable    Skin:    General: Skin is warm.  Neurological:     General: No focal deficit present.     Mental Status: He is alert.  Psychiatric:        Mood and Affect: Mood normal.     Data Reviewed:  There are no new results to review at this time. DG Chest Portable 1 View CLINICAL DATA:  Chest pain  EXAM: PORTABLE CHEST 1 VIEW  COMPARISON:  11/12/2022  FINDINGS: Low volume chest with cardiomegaly and congested appearance of vessels. There is no edema, consolidation, effusion, or pneumothorax. Remote right rib fractures.  Bilateral shoulder degeneration with chronic cystic change on both sides of the right glenohumeral joint.  IMPRESSION: Low volume chest with cardiomegaly and mild vascular congestion.  Electronically Signed   By: Tiburcio Pea M.D.   On: 01/15/2023 07:39  Last metabolic panel Lab Results  Component Value Date   GLUCOSE 187 (H) 01/15/2023  NA 129 (L) 01/15/2023   K 4.5 01/15/2023   CL 97 (L) 01/15/2023   CO2 23 01/15/2023   BUN 23 01/15/2023   CREATININE 0.93 01/15/2023   GFRNONAA >60 01/15/2023   CALCIUM 8.5 (L) 01/15/2023   PHOS 3.1 11/12/2022   PROT 6.5 01/15/2023   ALBUMIN 3.1 (L) 01/15/2023   BILITOT 1.2 01/15/2023   ALKPHOS 59 01/15/2023   AST 18 01/15/2023   ALT 12 01/15/2023   ANIONGAP 9 01/15/2023   Lab Results  Component Value Date   WBC 14.0 (H) 01/15/2023   HGB 11.7 (L) 01/15/2023   HCT 35.7 (L) 01/15/2023   MCV 85.0 01/15/2023   PLT 345 01/15/2023    Assessment and Plan: * NSTEMI (non-ST elevated myocardial infarction) (HCC) Troponin into the 400s in the setting of active decompensated heart failure EKG stable No active chest pain Suspect secondary demand ischemia in setting of heart failure exacerbation On heparin drip Cardiology consulted Follow-up recommendations  Acute on chronic combined systolic and diastolic CHF (congestive heart failure) (HCC) 2D echo July 2024 with EF of 20 to 25% Acute decompensated HFrEF with increased work of breathing, orthopnea, PND as well as lower extremity swelling over the past few days BNP 1700 with noted cardiomegaly on chest x-ray Status post IV Lasix in the ER Continue with diuresis as well as heart failure protocol Follow-up [facility] cardiology recommendations Monitor  Ischemic ulcer of lower extremity (HCC) Baseline hx/o PVD  post angioplasty and stenting January 2023 Noted admission at the end of July for active issues including worsening ulcer formation across multiple digits of the foot with  concern for gangrene Vascular procedure with extensive peripheral vascular disease, second procedure for bilateral endarterectomies and bilateral iliac stent placement  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 Recent wound debridement by vascular surgery September 19 Wound VAC in place and appears to be stable-CDI  Will otherwise monitor for now On heparin drip in the setting of NSTEMI Continue statin Vascular surgery consult as clinically indicated   Diabetic polyneuropathy associated with type 2 diabetes mellitus (HCC) Patient with worsening pain despite oral Neurontin regimen Will bridge with IV pain medication for now Consider discussion with vascular surgery to better assess pain regimen in the setting of PAD Follow-up  Hypertension BP stable  Titrate home regimen    Diabetes mellitus type 2, insulin dependent (HCC) SSI  A1C   Alcohol abuse Noted prior hx/o ETOH use  No reported active ETOH use at present  Monitor    Mixed hyperlipidemia Statin       Advance Care Planning:   Code Status: Full Code   Consults: Cardiology   Family Communication: Family at the bedside   Severity of Illness: The appropriate patient status for this patient is OBSERVATION. Observation status is judged to be reasonable and necessary in order to provide the required intensity of service to ensure the patient's safety. The patient's presenting symptoms, physical exam findings, and initial radiographic and laboratory data in the context of their medical condition is felt to place them at decreased risk for further clinical deterioration. Furthermore, it is anticipated that the patient will be medically stable for discharge from the hospital within 2 midnights of admission.   Author: Floydene Flock, MD 01/15/2023 11:25 AM  For on call review [email or web address].

## 2023-01-15 NOTE — Assessment & Plan Note (Addendum)
2D echo July 2024 with EF of 20 to 25% Acute decompensated HFrEF with increased work of breathing, orthopnea, PND as well as lower extremity swelling over the past few days BNP 1700 with noted cardiomegaly on chest x-ray Status post IV Lasix in the ER Continue with diuresis as well as heart failure protocol Follow-up [facility] cardiology recommendations Monitor

## 2023-01-15 NOTE — Consult Note (Signed)
ANTICOAGULATION CONSULT NOTE - Initial Consult  Pharmacy Consult for Heparin Infusion Indication: chest pain/ACS  No Known Allergies  Patient Measurements: Height: 5\' 8"  (172.7 cm) Weight: 84.4 kg (186 lb) IBW/kg (Calculated) : 68.4 Heparin Dosing Weight: 84.4 kg  Vital Signs: Temp: 98.7 F (37.1 C) (09/25 2122) Temp Source: Oral (09/25 1717) BP: 104/61 (09/25 2040) Pulse Rate: 75 (09/25 2040)  Labs: Recent Labs    01/15/23 0754 01/15/23 1011 01/15/23 1905  HGB 11.7*  --   --   HCT 35.7*  --   --   PLT 345  --   --   APTT  --  32  --   LABPROT  --  16.0*  --   INR  --  1.3*  --   HEPARINUNFRC  --   --  <0.10*  CREATININE 0.93  --   --   TROPONINIHS 431* 475*  --     Estimated Creatinine Clearance: 68.1 mL/min (by C-G formula based on SCr of 0.93 mg/dL).   Medical History: Past Medical History:  Diagnosis Date   Acute ST elevation myocardial infarction (STEMI) of inferior wall (HCC) 04/19/2010   a.) transfered from [facility] to [facility] --> LHC/PCI (very difficult procedure) --> 3.0 x 23 mm and 3.0 x 12 mm Xience stents to RCA   Allergies    Arthritis    Benign essential hypertension    Bilateral carotid artery disease (HCC) 05/08/2021   a.) carotid doppler 05/08/2021: 1-39% BICA   CAD (coronary artery disease) 04/19/2010   a.) inferior STEMI 04/19/2010 --> LHC/PCI: 50-70% pD1, 80% pRI, 90/90/90% RCA (overlapping 3.0 x 23 and 3.0 x 12 mm Xience DES); b.) MV 11/10/2018: fixed minimally reversible inferior perfusion defect   Cellulitis of foot    DDD (degenerative disc disease), cervical    Diabetes mellitus type 2, insulin dependent (HCC)    Diverticulosis    Full dentures    Gout    Hard of hearing    History of bilateral cataract extraction 2022   History of ETOH abuse    Hyperlipidemia    Ischemic cardiomyopathy 04/19/2010   a.) TTE 04/19/2010: 40%; b.) TTE 04/20/2014: EF >55%, mild RVE, triv PR, mild MR/TR, G1DD; c.) TTE 11/10/2018: EF 45%, inf HK, mild RVE,  triv TR/PR, mild MR, G1DD; d.) TTE 11/14/2022: EF 25-30%, basal anteroseptal, apical lateral, apical septal, and apex AK, mid anterolateral HK, mild-mod MR/TR   Long term current use of aspirin    Long term current use of clopidogrel    Lumbar degenerative disc disease    Lumbar radiculopathy    Lumbar vertebral fracture (chronic superior endplate of L1)    OSA (obstructive sleep apnea)    a.) unable to tolerate nocturnal PAP therapy   Peripheral artery disease (HCC)    a.) stenting 05/14/21: 12 mm x 12 cm LifeStent RIGHT dis SFA/prox pop; b.) s/p cath directed thrombolysis RIGHT SFA/pop 06/14/21; c.) s/p mech thrombectomy + stenting 06/15/21: 8 mm x 25cm & 8 mm x 7.5cm Viabahn; d.) s/p BILAT CFA, profunda femoris, SFA endarterectomies + fogarty embolectomy + stenting 11/15/22: 12mm x 58mm Lifestream BILAT CIAs, 14 mm x 6 cm Lifestream & 13 mm x 5 cm Viabahn LEFT EIA   Peripheral neuropathy    Umbilical hernia    Assessment: Manuel [name] is a 77 y.o. male presenting with chest pain. PMH significant for CAD, T2DM, HLD, neuropathy, PVD. Patient was not on [facility] PTA per chart review. Pharmacy has been consulted to  initiate and manage heparin infusion.   Baseline Labs: aPTT 32, PT 16.0, INR 1.3, Hgb 11.7, Hct 35.7, Plt 345   Goal of Therapy:  Heparin level 0.3-0.7 units/ml Monitor platelets by anticoagulation protocol: Yes   Plan:  9/25:  HL @ 1905 = < 0.1 - Will order heparin 2500 units IV X 1 bolus and increase drip rate to 1350 units/hr. - Will recheck HL 8 hrs after rate change  Avary Eichenberger D, PharmD Clinical Pharmacist 01/15/2023 9:39 PM

## 2023-01-15 NOTE — Assessment & Plan Note (Signed)
?   Statin.

## 2023-01-15 NOTE — Evaluation (Signed)
Physical Therapy Evaluation Patient Details Name: Manuel [name] MRN: [id number] DOB: 18-Dec-1945 Today's Date: 01/15/2023  History of Present Illness  Patient is a 77 y.o. 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  Patient reports worsening shortness of breath, increased work of breathing over the past 4 to 5 days. Current MD assessment:  NSTEMI, Acute on chronic combined systolic and diastolic CHF, Ischemic ulcer of lower extremity, and T2DM.  Clinical Impression  Pt was willing to participate during the session with cues and encouragement; put forth good effort throughout. Pt found sitting EOB upon entry, assisted pt with donning wedge shoe. Able to perform STS with CGA, only needing increased time to complete. Once up pt able walk ~60 feet with RW and CGA. Pt walking with step to pattern leading with RLE, overall slow cadence but safe with RW. Pt reports having greater stability with RW and wedge shoe donned vs. SPC with wedge shoe donned. Per nursing ok for trial of room air at start of session, pt on 2.5L initially with high 90's SpO2, pt SpO2 remained in mid 90's during session, nursing notified; left pt on room air. Pt will benefit from continued PT services upon discharge to safely address deficits listed in patient problem list for decreased caregiver assistance and eventual return to PLOF.           If plan is discharge home, recommend the following: A little help with walking and/or transfers;Assist for transportation;Help with stairs or ramp for entrance;A little help with bathing/dressing/bathroom   Can travel by private vehicle        Equipment Recommendations None  recommended by PT  Recommendations for Other Services       Functional Status Assessment Patient has had a recent decline in their functional status and demonstrates the ability to make significant improvements in function in a reasonable and predictable amount of time.     Precautions / Restrictions Precautions Precautions: Fall Restrictions Weight Bearing Restrictions: Yes Other Position/Activity Restrictions: per secure chat with Dr. Alberteen Spindle "wedge surgical shoe. Pressure only on the heel when walking"      Mobility  Bed Mobility               General bed mobility comments: seated EOB at start/end of session, donned wedge shoe in seated position    Transfers Overall transfer level: Needs assistance Equipment used: Rolling walker (2 wheels) Transfers: Sit to/from Stand Sit to Stand: Contact guard assist           General transfer comment: slow, req no physical assist    Ambulation/Gait Ambulation/Gait assistance: Contact guard assist Gait Distance (Feet): 60 Feet Assistive device: Rolling walker (2 wheels) Gait Pattern/deviations: Step-to pattern, Decreased step length - right, Decreased step length - left Gait velocity: decreased     General Gait Details: step to leading with RLE, overall slow but steady with gait pattern. Light cues to keep RW close  Careers information officer     Tilt Bed    Modified Rankin (Stroke Patients Only)       Balance Overall balance assessment: Needs assistance Sitting-balance support: Feet supported Sitting balance-Leahy Scale: Normal  Standing balance support: During functional activity, Single extremity supported, Bilateral upper extremity supported Standing balance-Leahy Scale: Fair Standing balance comment: static standing, adjusting shorts and belt                             Pertinent Vitals/Pain Pain Assessment Pain Assessment: Faces Faces Pain Scale: No hurt    Home  Living Family/patient expects to be discharged to:: Private residence Living Arrangements: Spouse/significant other Available Help at Discharge: Family;Available 24 hours/day Type of Home: House Home Access: Ramped entrance     Alternate Level Stairs-Number of Steps: flight Home Layout: Multi-level;Laundry or work area in [employer] Equipment: Agricultural consultant (2 wheels);BSC/3in1;Shower seat      Prior Function Prior Level of Function : Independent/Modified Independent;Driving             Mobility Comments: amb with no AD/ PRN use of RW/ crutches household distances ADLs Comments: pt reports no assist needed with showering/bathing     Extremity/Trunk Assessment   Upper Extremity Assessment Upper Extremity Assessment: Overall WFL for tasks assessed    Lower Extremity Assessment Lower Extremity Assessment: Generalized weakness;RLE deficits/detail RLE Deficits / Details: Edema throughout RLE, decreased AROM       Communication   Communication Communication: No apparent difficulties Cueing Techniques: Verbal cues;Tactile cues  Cognition Arousal: Alert Behavior During Therapy: WFL for tasks assessed/performed Overall Cognitive Status: Within Functional Limits for tasks assessed                                          General Comments General comments (skin integrity, edema, etc.): spo2 >95% on RA, HR in the 80s pre/post mobility; BP 152/88, HR 87 post session; wound vac intact pre/post session    Exercises     Assessment/Plan    PT Assessment Patient needs continued PT services  PT Problem List Decreased strength;Decreased range of motion;Decreased activity tolerance;Decreased balance;Decreased mobility;Decreased coordination       PT Treatment Interventions DME instruction;Gait training;Stair training;Functional mobility training;Therapeutic activities;Therapeutic exercise;Balance training    PT Goals (Current goals can be found in the Care  Plan section)  Acute Rehab PT Goals Patient Stated Goal: go home PT Goal Formulation: With patient Time For Goal Achievement: 01/28/23 Potential to Achieve Goals: Good    Frequency Min 1X/week     Co-evaluation               AM-PAC PT "6 Clicks" Mobility  Outcome Measure Help needed turning from your back to your side while in a flat bed without using bedrails?: A Little Help needed moving from lying on your back to sitting on the side of a flat bed without using bedrails?: A Little Help needed moving to and from a bed to a chair (including a wheelchair)?: A Little Help needed standing up from a chair using your arms (e.g., wheelchair or bedside chair)?: A Little Help needed to walk in hospital room?: A Little Help needed climbing 3-5 steps with a railing? : A Little 6 Click Score: 18    End of Session Equipment Utilized During Treatment: Gait belt Activity Tolerance: Patient tolerated treatment well Patient left: Sitting EOB where found;with family/visitor present;with call bell/phone within reach Nurse Communication: Mobility status PT Visit Diagnosis: Other abnormalities of gait and mobility (R26.89);Difficulty in walking, not elsewhere classified (R26.2);Unsteadiness on feet (R26.81)  Time: 1610-9604 PT Time Calculation (min) (ACUTE ONLY): 33 min   Charges:                 Cecile Sheerer, SPT 01/15/23, 4:32 PM

## 2023-01-15 NOTE — ED Notes (Signed)
ED TO INPATIENT HANDOFF REPORT  ED Nurse Name and Phone #:   S Name/Age/Gender Manuel [name] 77 y.o. male Room/Bed: ED37A/ED37A  Code Status   Code Status: Full Code  Home/SNF/Other Home Patient oriented to: self, place, time, and situation Is this baseline? Yes   Triage Complete: Triage complete  Chief Complaint NSTEMI (non-ST elevated myocardial infarction) [facility]) [I21.4]  Triage Note Pt to ED via [facility] from home for c/o chest pain, sob, abdominal pain, unable to sleep d/t  pain. Pt took aspirin.Pt had one episode of emesis with EMS, given zofran. Pt had circulation surgery Thursday, has wound vac. Hx diabetes, heart attack w/stint placement 15 years ago.  95% room air BP 177/108 Cbg 173   Allergies No Known Allergies  Level of Care/Admitting Diagnosis ED Disposition     ED Disposition  Admit   Condition  --   Comment  Hospital Area: [facility] [[id number]]  Level of Care: Telemetry Cardiac [[id number]]  Covid Evaluation: Confirmed COVID Negative  Diagnosis: NSTEMI (non-ST elevated myocardial infarction) [facility]) [[id number]]  Admitting Physician: Floydene [name] [[id number]]  Attending Physician: Floydene [name] [phone]  Certification:: I certify this patient will need inpatient services for at least 2 midnights  Expected Medical Readiness: 01/18/2023          B Medical/Surgery History Past Medical History:  Diagnosis Date   Acute ST elevation myocardial infarction (STEMI) of inferior wall (HCC) 04/19/2010   a.) transfered from [facility] to [facility] --> LHC/PCI (very difficult procedure) --> 3.0 x 23 mm and 3.0 x 12 mm Xience stents to RCA   Allergies    Arthritis    Benign essential hypertension    Bilateral carotid artery disease (HCC) 05/08/2021   a.) carotid doppler 05/08/2021: 1-39% BICA   CAD (coronary artery disease) 04/19/2010   a.) inferior STEMI 04/19/2010 --> LHC/PCI: 50-70% pD1, 80% pRI, 90/90/90% RCA (overlapping 3.0 x 23 and 3.0 x 12 mm  Xience DES); b.) MV 11/10/2018: fixed minimally reversible inferior perfusion defect   Cellulitis of foot    DDD (degenerative disc disease), cervical    Diabetes mellitus type 2, insulin dependent (HCC)    Diverticulosis    Full dentures    Gout    Hard of hearing    History of bilateral cataract extraction 2022   History of ETOH abuse    Hyperlipidemia    Ischemic cardiomyopathy 04/19/2010   a.) TTE 04/19/2010: 40%; b.) TTE 04/20/2014: EF >55%, mild RVE, triv PR, mild MR/TR, G1DD; c.) TTE 11/10/2018: EF 45%, inf HK, mild RVE, triv TR/PR, mild MR, G1DD; d.) TTE 11/14/2022: EF 25-30%, basal anteroseptal, apical lateral, apical septal, and apex AK, mid anterolateral HK, mild-mod MR/TR   Long term current use of aspirin    Long term current use of clopidogrel    Lumbar degenerative disc disease    Lumbar radiculopathy    Lumbar vertebral fracture (chronic superior endplate of L1)    OSA (obstructive sleep apnea)    a.) unable to tolerate nocturnal PAP therapy   Peripheral artery disease (HCC)    a.) stenting 05/14/21: 12 mm x 12 cm LifeStent RIGHT dis SFA/prox pop; b.) s/p cath directed thrombolysis RIGHT SFA/pop 06/14/21; c.) s/p mech thrombectomy + stenting 06/15/21: 8 mm x 25cm & 8 mm x 7.5cm Viabahn; d.) s/p BILAT CFA, profunda femoris, SFA endarterectomies + fogarty embolectomy + stenting 11/15/22: 12mm x 58mm Lifestream BILAT CIAs, 14 mm x 6 cm Lifestream & 13 mm x 5  cm Viabahn LEFT EIA   Peripheral neuropathy    Umbilical hernia    Past Surgical History:  Procedure Laterality Date   AMPUTATION Right 11/18/2022   Procedure: AMPUTATION 4TH AND 5TH RAY;  Surgeon: Linus Galas, [name];  Location: [facility];  Service: Orthopedics/Podiatry;  Laterality: Right;  4th and 5th toe   APPLICATION OF WOUND VAC Right 01/09/2023   Procedure: APPLICATION OF WOUND VAC;  Surgeon: Annice Needy, MD;  Location: [facility];  Service: Vascular;  Laterality: Right;   CATARACT EXTRACTION W/PHACO Right 03/14/2021    Procedure: CATARACT EXTRACTION PHACO AND INTRAOCULAR LENS PLACEMENT (IOC) RIGHT DIABETIC;  Surgeon: Lockie Mola, MD;  Location: [facility];  Service: Ophthalmology;  Laterality: Right;  Diabetic 16.78 01:39.9   CATARACT EXTRACTION W/PHACO Left 03/28/2021   Procedure: CATARACT EXTRACTION PHACO AND INTRAOCULAR LENS PLACEMENT (IOC) LEFT DIABETIC 6.93 01:22.0;  Surgeon: Lockie Mola, MD;  Location: [facility];  Service: Ophthalmology;  Laterality: Left;  Diabetic   COLONOSCOPY     CORONARY ANGIOPLASTY WITH STENT PLACEMENT  03/2010   Procedure: CORONARY ANGIOPLASTY WITH STENT PLACEMENT; Location: [name]   ENDARTERECTOMY FEMORAL Bilateral 11/15/2022   Procedure: BILATERAL COMMON FEMORAL PROFUNDA FEMORIS AND SUPERFICIAL FEMORAL ARTERY ENDARTECTOMIES, RIGHT FOGARTY EMBOLECTOMY OF THE RIGHT SFA  AND  POPLITEAL ARTERIES. AORTAGRAM AND RIGHT LOWER EXTREMITY ANGIOGRAM.;  Surgeon: Annice Needy, MD;  Location: [facility];  Service: Vascular;  Laterality: Bilateral;   INSERTION OF ILIAC STENT Bilateral 11/15/2022   Procedure: BILATERAL STENT INSERTION IN BILATERAL  COMMON ILIAC ARTERY, STENT INSERTION OF LEFT EXTERNAL ILIAC ARTERY. ANGIOPLASTY RIGHT TIBIAL  AND POPLITEAL ARTERY.;  Surgeon: Annice Needy, MD;  Location: [facility];  Service: Vascular;  Laterality: Bilateral;   LOWER EXTREMITY ANGIOGRAPHY Right 05/14/2021   Procedure: LOWER EXTREMITY ANGIOGRAPHY;  Surgeon: Annice Needy, MD;  Location: [facility];  Service: Cardiovascular;  Laterality: Right;   LOWER EXTREMITY ANGIOGRAPHY Right 06/14/2021   Procedure: Lower Extremity Angiography;  Surgeon: Annice Needy, MD;  Location: [facility];  Service: Cardiovascular;  Laterality: Right;   LOWER EXTREMITY ANGIOGRAPHY Right 11/11/2022   Procedure: Lower Extremity Angiography;  Surgeon: Annice Needy, MD;  Location: [facility];  Service: Cardiovascular;  Laterality: Right;   LOWER EXTREMITY INTERVENTION Right  06/15/2021   Procedure: LOWER EXTREMITY INTERVENTION;  Surgeon: Annice Needy, MD;  Location: [facility];  Service: Cardiovascular;  Laterality: Right;   TONSILLECTOMY     WOUND DEBRIDEMENT Right 01/09/2023   Procedure: DEBRIDEMENT WOUND;  Surgeon: Annice Needy, MD;  Location: [facility];  Service: Vascular;  Laterality: Right;     A IV Location/Drains/Wounds Patient Lines/Drains/Airways Status     Active Line/Drains/Airways     Name Placement date Placement time Site Days   Peripheral IV 01/15/23 20 G Anterior;Distal;Left;Upper Arm 01/15/23  --  Arm  less than 1   Peripheral IV 01/15/23 22 G Posterior;Right Wrist 01/15/23  --  Wrist  less than 1   Negative Pressure Wound Therapy Groin Right 01/09/23  0930  --  6            Intake/Output Last 24 hours No intake or output data in the 24 hours ending 01/15/23 2239  Labs/Imaging Results for orders placed or performed during the hospital encounter of 01/15/23 (from the past 48 hour(s))  CBC with Differential     Status: Abnormal   Collection Time: 01/15/23  7:54 AM  Result Value Ref Range  WBC 14.0 (H) 4.0 - 10.5 K/uL   RBC 4.20 (L) 4.22 - 5.81 MIL/uL   Hemoglobin 11.7 (L) 13.0 - 17.0 g/dL   HCT 16.1 (L) 09.6 - 04.5 %   MCV 85.0 80.0 - 100.0 fL   MCH 27.9 26.0 - 34.0 pg   MCHC 32.8 30.0 - 36.0 g/dL   RDW 40.9 81.1 - 91.4 %   Platelets 345 150 - 400 K/uL   nRBC 0.0 0.0 - 0.2 %   Neutrophils Relative % 84 %   Neutro Abs 11.7 (H) 1.7 - 7.7 K/uL   Lymphocytes Relative 8 %   Lymphs Abs 1.1 0.7 - 4.0 K/uL   Monocytes Relative 7 %   Monocytes Absolute 1.0 0.1 - 1.0 K/uL   Eosinophils Relative 0 %   Eosinophils Absolute 0.1 0.0 - 0.5 K/uL   Basophils Relative 0 %   Basophils Absolute 0.1 0.0 - 0.1 K/uL   Immature Granulocytes 1 %   Abs Immature Granulocytes 0.10 (H) 0.00 - 0.07 K/uL    Comment: Performed at [facility], [address] [place], [place] [address]  Comprehensive metabolic panel     Status:  Abnormal   Collection Time: 01/15/23  7:54 AM  Result Value Ref Range   Sodium 129 (L) 135 - 145 mmol/L   Potassium 4.5 3.5 - 5.1 mmol/L   Chloride 97 (L) 98 - 111 mmol/L   CO2 23 22 - 32 mmol/L   Glucose, Bld 187 (H) 70 - 99 mg/dL    Comment: Glucose reference range applies only to samples taken after fasting for at least 8 hours.   BUN 23 8 - 23 mg/dL   Creatinine, Ser 6.21 0.61 - 1.24 mg/dL   Calcium 8.5 (L) 8.9 - 10.3 mg/dL   Total Protein 6.5 6.5 - 8.1 g/dL   Albumin 3.1 (L) 3.5 - 5.0 g/dL   AST 18 15 - 41 U/L   ALT 12 0 - 44 U/L   Alkaline Phosphatase 59 38 - 126 U/L   Total Bilirubin 1.2 0.3 - 1.2 mg/dL   GFR, Estimated >30 >86 mL/min    Comment: (NOTE) Calculated using the CKD-EPI Creatinine Equation (2021)    Anion gap 9 5 - 15    Comment: Performed at [facility] [address] [place], [place] [address]  Troponin I (High Sensitivity)     Status: Abnormal   Collection Time: 01/15/23  7:54 AM  Result Value Ref Range   Troponin I (High Sensitivity) 431 (HH) <18 ng/L    Comment: CRITICAL RESULT CALLED TO, READ BACK BY AND VERIFIED WITH LISA GEISLER 01/15/23 0839 [facility] (NOTE) Elevated high sensitivity troponin I (hsTnI) values and significant  changes across serial measurements may suggest ACS but many other  chronic and acute conditions are known to elevate hsTnI results.  Refer to the "Links" section for chest pain algorithms and additional  guidance. Performed at [facility], [address] [place], [place] [address]   Brain natriuretic peptide     Status: Abnormal   Collection Time: 01/15/23  7:54 AM  Result Value Ref Range   B Natriuretic Peptide 1,760.5 (H) 0.0 - 100.0 pg/mL    Comment: Performed at [facility], [address] [place], [place] [address]  Lactic acid, plasma     Status: None   Collection Time: 01/15/23  7:54 AM  Result Value Ref Range   Lactic Acid, Venous 1.4 0.5 - 1.9 mmol/L    Comment:  Performed at  [facility], [address] [place], [place] [address]  Lactic acid, plasma     Status: None   Collection Time: 01/15/23 10:11 AM  Result Value Ref Range   Lactic Acid, Venous 1.3 0.5 - 1.9 mmol/L    Comment: Performed at [facility], [address] [place], [place] [address]  Troponin I (High Sensitivity)     Status: Abnormal   Collection Time: 01/15/23 10:11 AM  Result Value Ref Range   Troponin I (High Sensitivity) 475 (HH) <18 ng/L    Comment: CRITICAL VALUE NOTED. VALUE IS CONSISTENT WITH PREVIOUSLY REPORTED/CALLED VALUE [facility] (NOTE) Elevated high sensitivity troponin I (hsTnI) values and significant  changes across serial measurements may suggest ACS but many other  chronic and acute conditions are known to elevate hsTnI results.  Refer to the "Links" section for chest pain algorithms and additional  guidance. Performed at [facility], [address] [place], [place] [address]   APTT     Status: None   Collection Time: 01/15/23 10:11 AM  Result Value Ref Range   aPTT 32 24 - 36 seconds    Comment: Performed at [facility], [address] [place], [place] [address]  Protime-INR     Status: Abnormal   Collection Time: 01/15/23 10:11 AM  Result Value Ref Range   Prothrombin Time 16.0 (H) 11.4 - 15.2 seconds   INR 1.3 (H) 0.8 - 1.2    Comment: (NOTE) INR goal varies based on device and disease states. Performed at [facility], [address] [place], [place] [address]   CBG monitoring, ED     Status: Abnormal   Collection Time: 01/15/23 12:47 PM  Result Value Ref Range   Glucose-Capillary 162 (H) 70 - 99 mg/dL    Comment: Glucose reference range applies only to samples taken after fasting for at least 8 hours.  CBG monitoring, ED     Status: Abnormal   Collection Time: 01/15/23  5:54 PM  Result Value Ref Range   Glucose-Capillary 217 (H) 70 - 99 mg/dL    Comment: Glucose reference range applies only to samples  taken after fasting for at least 8 hours.  Heparin level (unfractionated)     Status: Abnormal   Collection Time: 01/15/23  7:05 PM  Result Value Ref Range   Heparin Unfractionated <0.10 (L) 0.30 - 0.70 IU/mL    Comment: REPEATED TO VERIFY Performed at [facility], [address] [place], [place] [address]   CBG monitoring, ED     Status: Abnormal   Collection Time: 01/15/23  9:48 PM  Result Value Ref Range   Glucose-Capillary 134 (H) 70 - 99 mg/dL    Comment: Glucose reference range applies only to samples taken after fasting for at least 8 hours.   DG Chest Portable 1 View  Result Date: 01/15/2023 CLINICAL DATA:  Chest pain EXAM: PORTABLE CHEST 1 VIEW COMPARISON:  11/12/2022 FINDINGS: Low volume chest with cardiomegaly and congested appearance of vessels. There is no edema, consolidation, effusion, or pneumothorax. Remote right rib fractures. Bilateral shoulder degeneration with chronic cystic change on both sides of the right glenohumeral joint. IMPRESSION: Low volume chest with cardiomegaly and mild vascular congestion. Electronically Signed   By: Tiburcio Pea M.D.   On: 01/15/2023 07:39    Pending Labs Unresulted Labs (From admission, onward)     Start     Ordered   01/16/23 0600  Heparin level (unfractionated)  Once-Timed,  TIMED        01/15/23 2144   01/16/23 0500  CBC  Tomorrow morning,   R        01/15/23 1107   01/16/23 0500  Comprehensive metabolic panel  Tomorrow morning,   R        01/15/23 1107            Vitals/Pain Today's Vitals   01/15/23 1730 01/15/23 2040 01/15/23 2122 01/15/23 2226  BP: (!) 154/80 104/61    Pulse: 94 75    Resp: 18 (!) 22    Temp:   98.7 F (37.1 C)   TempSrc:      SpO2: 95% 93%    Weight:      Height:      PainSc:    5     Isolation Precautions No active isolations  Medications Medications  heparin ADULT infusion 100 units/mL (25000 units/252mL) (1,350 Units/hr Intravenous Rate/Dose Change 01/15/23 2141)   sodium chloride flush (NS) 0.9 % injection 3 mL (3 mLs Intravenous Given 01/15/23 2156)  sodium chloride flush (NS) 0.9 % injection 3 mL (has no administration in time range)  0.9 %  sodium chloride infusion (has no administration in time range)  ondansetron (ZOFRAN) tablet 4 mg ( Oral See Alternative 01/15/23 1538)    Or  ondansetron (ZOFRAN) injection 4 mg (4 mg Intravenous Given 01/15/23 1538)  HYDROmorphone (DILAUDID) injection 1 mg (1 mg Intravenous Given 01/15/23 2154)  insulin aspart (novoLOG) injection 0-9 Units (3 Units Subcutaneous Given 01/15/23 1756)  insulin aspart (novoLOG) injection 0-5 Units ( Subcutaneous Not Given 01/15/23 2156)  insulin aspart (novoLOG) injection 3 Units (3 Units Subcutaneous Given 01/15/23 1757)  furosemide (LASIX) injection 60 mg (60 mg Intravenous Given 01/15/23 1538)  carvedilol (COREG) tablet 25 mg (25 mg Oral Given 01/15/23 1756)  atorvastatin (LIPITOR) tablet 80 mg (80 mg Oral Given 01/15/23 2140)  clopidogrel (PLAVIX) tablet 75 mg (75 mg Oral Given 01/15/23 1756)  losartan (COZAAR) tablet 25 mg (has no administration in time range)  gabapentin (NEURONTIN) capsule 800 mg (800 mg Oral Given 01/15/23 2140)  morphine (PF) 4 MG/ML injection 6 mg (6 mg Intravenous Given 01/15/23 0749)  ondansetron (ZOFRAN) injection 4 mg (4 mg Intravenous Given 01/15/23 0748)  furosemide (LASIX) injection 20 mg (20 mg Intravenous Given 01/15/23 0920)  heparin bolus via infusion 4,000 Units (4,000 Units Intravenous Bolus from Bag 01/15/23 1035)  alum & mag hydroxide-simeth (MAALOX/MYLANTA) 200-200-20 MG/5ML suspension 30 mL (30 mLs Oral Given 01/15/23 1923)  heparin bolus via infusion 2,500 Units (2,500 Units Intravenous Bolus from Bag 01/15/23 2142)    Mobility walks     Focused Assessments    R Recommendations: See Admitting Provider Note  Report given to:   Additional Notes:

## 2023-01-15 NOTE — ED Triage Notes (Signed)
Pt to ED via [facility] from home for c/o chest pain, sob, abdominal pain, unable to sleep d/t  pain. Pt took aspirin.Pt had one episode of emesis with EMS, given zofran. Pt had circulation surgery Thursday, has wound vac. Hx diabetes, heart attack w/stint placement 15 years ago.  95% room air BP 177/108 Cbg 173

## 2023-01-15 NOTE — Consult Note (Signed)
01/15/23 0754  WBC 14.0*  NEUTROABS 11.7*  HGB 11.7*  HCT 35.7*  MCV 85.0  PLT 345   Cardiac Enzymes: Recent Labs    01/15/23 0754 01/15/23 1011  TROPONINIHS 431* 475*   BNP: Recent Labs    01/15/23 0754  BNP 1,760.5*   D-Dimer: No results for input(s): "DDIMER" in the last 72 hours. Hemoglobin A1C: No results for input(s): "HGBA1C" in the last 72 hours. Fasting Lipid Panel: No results for input(s): "CHOL", "HDL", "LDLCALC", "TRIG", "CHOLHDL", "LDLDIRECT" in the last 72 hours. Thyroid Function Tests: No results for input(s): "TSH", "T4TOTAL", "T3FREE", "THYROIDAB" in the last 72 hours.  Invalid input(s): "FREET3" Anemia Panel: No results for input(s): "VITAMINB12", "FOLATE", "FERRITIN", "TIBC", "IRON", "RETICCTPCT" in the last 72 hours.   Radiology: DG Chest Portable 1 View  Result Date: 01/15/2023 CLINICAL DATA:  Chest pain EXAM: PORTABLE CHEST 1 VIEW COMPARISON:  11/12/2022 FINDINGS: Low volume chest with cardiomegaly and congested appearance of vessels. There is no edema, consolidation, effusion, or pneumothorax. Remote right rib fractures. Bilateral shoulder degeneration with chronic cystic change on both sides of the right glenohumeral joint. IMPRESSION: Low volume chest with cardiomegaly and mild vascular congestion. Electronically Signed   By: Tiburcio Pea M.D.   On: 01/15/2023 07:39   VAS [place] LOWER EXTREMITY VENOUS (DVT)  Result Date: 01/06/2023  Lower Venous DVT Study Patient Name:  Manuel [name]  Date of Exam:   01/01/2023 Medical Rec #: [id number]         Accession #:    [id number] Date of Birth: [birth year]         Patient Gender: M Patient Age:   40 years Exam Location:  [facility] Procedure:      VAS [place] LOWER EXTREMITY VENOUS (DVT) Referring Phys: Sheppard Plumber  --------------------------------------------------------------------------------  Indications: Pain, Swelling, and Edema.  Risk Factors: Surgery Bilateral femoral endarterectomies 11/15/2022. Performing Technologist: Hardie Lora RVT  Examination Guidelines: A complete evaluation includes B-mode imaging, spectral Doppler, color Doppler, and power Doppler as needed of all accessible portions of each vessel. Bilateral testing is considered an integral part of a complete examination. Limited examinations for reoccurring indications may be performed as noted. The reflux portion of the exam is performed with the patient in reverse Trendelenburg.  +---------+---------------+---------+-----------+----------+--------------+ RIGHT    CompressibilityPhasicitySpontaneityPropertiesThrombus Aging +---------+---------------+---------+-----------+----------+--------------+ CFV      Full           Yes      Yes                                 +---------+---------------+---------+-----------+----------+--------------+ SFJ      Full           Yes      Yes                                 +---------+---------------+---------+-----------+----------+--------------+ FV Prox  Full           Yes      Yes                                 +---------+---------------+---------+-----------+----------+--------------+ FV Mid   Full           Yes      Yes                                 +---------+---------------+---------+-----------+----------+--------------+  [facility] CARDIOLOGY CONSULT NOTE       Patient ID: Manuel [name] MRN: [id number] DOB/AGE: Feb 24, 1946 77 y.o.  Admit date: 01/15/2023 Referring Physician Dr. Doree Albee Primary Physician Dr. Marcelino Duster Primary Cardiologist Dr. Lorelle Gibbs Reason for Consultation AoCHF  HPI: Manuel [name] is a 77 y.o. 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 Cardiology was consulted for further evaluation.   Patient states that he was in his usual state of health until last night whenever he was unable to sleep.  States that every time he would lay down he would become very short of breath and would be gasping for air.  States that he was unable to lie flat and thus did not get any sleep because of this.  He also reports severe right ankle pain last night that was sharp in nature and would last for few seconds and resolved.  This sensation came and went throughout the night and was very painful.  After dealing with the shortness of breath and ankle pain all night he decided to have his wife call EMS this morning to bring him to the ED for evaluation.  Workup in the ED notable for chest x-ray with vascular congestion, BNP elevated to 1700 troponins elevated in the 400s and flat.  He received IV Lasix in the ED and reports good urine output with this.  At the time of my evaluation this afternoon patient is sitting upright on side of hospital bed with wife and son present at bedside.  States that shortness of breath has improved some overall, states that he was able to get some sleep earlier today.  He is currently on supplemental oxygen at 2 L [employer].  States that over the last few weeks he  has noticed worsening lower extremity edema.  He denies any recent issues with chest pain, palpitations, dizziness, dyspnea on exertion.  Denies any recent problems with shortness of breath until last night when he became orthopneic.  He also expressed concern that despite his multiple vascular procedures he continues to have pain in his lower extremities.  Review of systems complete and found to be negative unless listed above    Past Medical History:  Diagnosis Date   Acute ST elevation myocardial infarction (STEMI) of inferior wall (HCC) 04/19/2010   a.) transfered from [facility] to [facility] --> LHC/PCI (very difficult procedure) --> 3.0 x 23 mm and 3.0 x 12 mm Xience stents to RCA   Allergies    Arthritis    Benign essential hypertension    Bilateral carotid artery disease (HCC) 05/08/2021   a.) carotid doppler 05/08/2021: 1-39% BICA   CAD (coronary artery disease) 04/19/2010   a.) inferior STEMI 04/19/2010 --> LHC/PCI: 50-70% pD1, 80% pRI, 90/90/90% RCA (overlapping 3.0 x 23 and 3.0 x 12 mm Xience DES); b.) MV 11/10/2018: fixed minimally reversible inferior perfusion defect   Cellulitis of foot    DDD (degenerative disc disease), cervical    Diabetes mellitus type 2, insulin dependent (HCC)    Diverticulosis    Full dentures    Gout    Hard of hearing    History of bilateral cataract extraction 2022   History of ETOH  [facility] CARDIOLOGY CONSULT NOTE       Patient ID: Manuel [name] MRN: [id number] DOB/AGE: Feb 24, 1946 77 y.o.  Admit date: 01/15/2023 Referring Physician Dr. Doree Albee Primary Physician Dr. Marcelino Duster Primary Cardiologist Dr. Lorelle Gibbs Reason for Consultation AoCHF  HPI: Manuel [name] is a 77 y.o. 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 Cardiology was consulted for further evaluation.   Patient states that he was in his usual state of health until last night whenever he was unable to sleep.  States that every time he would lay down he would become very short of breath and would be gasping for air.  States that he was unable to lie flat and thus did not get any sleep because of this.  He also reports severe right ankle pain last night that was sharp in nature and would last for few seconds and resolved.  This sensation came and went throughout the night and was very painful.  After dealing with the shortness of breath and ankle pain all night he decided to have his wife call EMS this morning to bring him to the ED for evaluation.  Workup in the ED notable for chest x-ray with vascular congestion, BNP elevated to 1700 troponins elevated in the 400s and flat.  He received IV Lasix in the ED and reports good urine output with this.  At the time of my evaluation this afternoon patient is sitting upright on side of hospital bed with wife and son present at bedside.  States that shortness of breath has improved some overall, states that he was able to get some sleep earlier today.  He is currently on supplemental oxygen at 2 L [employer].  States that over the last few weeks he  has noticed worsening lower extremity edema.  He denies any recent issues with chest pain, palpitations, dizziness, dyspnea on exertion.  Denies any recent problems with shortness of breath until last night when he became orthopneic.  He also expressed concern that despite his multiple vascular procedures he continues to have pain in his lower extremities.  Review of systems complete and found to be negative unless listed above    Past Medical History:  Diagnosis Date   Acute ST elevation myocardial infarction (STEMI) of inferior wall (HCC) 04/19/2010   a.) transfered from [facility] to [facility] --> LHC/PCI (very difficult procedure) --> 3.0 x 23 mm and 3.0 x 12 mm Xience stents to RCA   Allergies    Arthritis    Benign essential hypertension    Bilateral carotid artery disease (HCC) 05/08/2021   a.) carotid doppler 05/08/2021: 1-39% BICA   CAD (coronary artery disease) 04/19/2010   a.) inferior STEMI 04/19/2010 --> LHC/PCI: 50-70% pD1, 80% pRI, 90/90/90% RCA (overlapping 3.0 x 23 and 3.0 x 12 mm Xience DES); b.) MV 11/10/2018: fixed minimally reversible inferior perfusion defect   Cellulitis of foot    DDD (degenerative disc disease), cervical    Diabetes mellitus type 2, insulin dependent (HCC)    Diverticulosis    Full dentures    Gout    Hard of hearing    History of bilateral cataract extraction 2022   History of ETOH  [facility] CARDIOLOGY CONSULT NOTE       Patient ID: Manuel [name] MRN: [id number] DOB/AGE: Feb 24, 1946 77 y.o.  Admit date: 01/15/2023 Referring Physician Dr. Doree Albee Primary Physician Dr. Marcelino Duster Primary Cardiologist Dr. Lorelle Gibbs Reason for Consultation AoCHF  HPI: Manuel [name] is a 77 y.o. 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 Cardiology was consulted for further evaluation.   Patient states that he was in his usual state of health until last night whenever he was unable to sleep.  States that every time he would lay down he would become very short of breath and would be gasping for air.  States that he was unable to lie flat and thus did not get any sleep because of this.  He also reports severe right ankle pain last night that was sharp in nature and would last for few seconds and resolved.  This sensation came and went throughout the night and was very painful.  After dealing with the shortness of breath and ankle pain all night he decided to have his wife call EMS this morning to bring him to the ED for evaluation.  Workup in the ED notable for chest x-ray with vascular congestion, BNP elevated to 1700 troponins elevated in the 400s and flat.  He received IV Lasix in the ED and reports good urine output with this.  At the time of my evaluation this afternoon patient is sitting upright on side of hospital bed with wife and son present at bedside.  States that shortness of breath has improved some overall, states that he was able to get some sleep earlier today.  He is currently on supplemental oxygen at 2 L [employer].  States that over the last few weeks he  has noticed worsening lower extremity edema.  He denies any recent issues with chest pain, palpitations, dizziness, dyspnea on exertion.  Denies any recent problems with shortness of breath until last night when he became orthopneic.  He also expressed concern that despite his multiple vascular procedures he continues to have pain in his lower extremities.  Review of systems complete and found to be negative unless listed above    Past Medical History:  Diagnosis Date   Acute ST elevation myocardial infarction (STEMI) of inferior wall (HCC) 04/19/2010   a.) transfered from [facility] to [facility] --> LHC/PCI (very difficult procedure) --> 3.0 x 23 mm and 3.0 x 12 mm Xience stents to RCA   Allergies    Arthritis    Benign essential hypertension    Bilateral carotid artery disease (HCC) 05/08/2021   a.) carotid doppler 05/08/2021: 1-39% BICA   CAD (coronary artery disease) 04/19/2010   a.) inferior STEMI 04/19/2010 --> LHC/PCI: 50-70% pD1, 80% pRI, 90/90/90% RCA (overlapping 3.0 x 23 and 3.0 x 12 mm Xience DES); b.) MV 11/10/2018: fixed minimally reversible inferior perfusion defect   Cellulitis of foot    DDD (degenerative disc disease), cervical    Diabetes mellitus type 2, insulin dependent (HCC)    Diverticulosis    Full dentures    Gout    Hard of hearing    History of bilateral cataract extraction 2022   History of ETOH  01/15/23 0754  WBC 14.0*  NEUTROABS 11.7*  HGB 11.7*  HCT 35.7*  MCV 85.0  PLT 345   Cardiac Enzymes: Recent Labs    01/15/23 0754 01/15/23 1011  TROPONINIHS 431* 475*   BNP: Recent Labs    01/15/23 0754  BNP 1,760.5*   D-Dimer: No results for input(s): "DDIMER" in the last 72 hours. Hemoglobin A1C: No results for input(s): "HGBA1C" in the last 72 hours. Fasting Lipid Panel: No results for input(s): "CHOL", "HDL", "LDLCALC", "TRIG", "CHOLHDL", "LDLDIRECT" in the last 72 hours. Thyroid Function Tests: No results for input(s): "TSH", "T4TOTAL", "T3FREE", "THYROIDAB" in the last 72 hours.  Invalid input(s): "FREET3" Anemia Panel: No results for input(s): "VITAMINB12", "FOLATE", "FERRITIN", "TIBC", "IRON", "RETICCTPCT" in the last 72 hours.   Radiology: DG Chest Portable 1 View  Result Date: 01/15/2023 CLINICAL DATA:  Chest pain EXAM: PORTABLE CHEST 1 VIEW COMPARISON:  11/12/2022 FINDINGS: Low volume chest with cardiomegaly and congested appearance of vessels. There is no edema, consolidation, effusion, or pneumothorax. Remote right rib fractures. Bilateral shoulder degeneration with chronic cystic change on both sides of the right glenohumeral joint. IMPRESSION: Low volume chest with cardiomegaly and mild vascular congestion. Electronically Signed   By: Tiburcio Pea M.D.   On: 01/15/2023 07:39   VAS [place] LOWER EXTREMITY VENOUS (DVT)  Result Date: 01/06/2023  Lower Venous DVT Study Patient Name:  Manuel [name]  Date of Exam:   01/01/2023 Medical Rec #: [id number]         Accession #:    [id number] Date of Birth: [birth year]         Patient Gender: M Patient Age:   40 years Exam Location:  [facility] Procedure:      VAS [place] LOWER EXTREMITY VENOUS (DVT) Referring Phys: Sheppard Plumber  --------------------------------------------------------------------------------  Indications: Pain, Swelling, and Edema.  Risk Factors: Surgery Bilateral femoral endarterectomies 11/15/2022. Performing Technologist: Hardie Lora RVT  Examination Guidelines: A complete evaluation includes B-mode imaging, spectral Doppler, color Doppler, and power Doppler as needed of all accessible portions of each vessel. Bilateral testing is considered an integral part of a complete examination. Limited examinations for reoccurring indications may be performed as noted. The reflux portion of the exam is performed with the patient in reverse Trendelenburg.  +---------+---------------+---------+-----------+----------+--------------+ RIGHT    CompressibilityPhasicitySpontaneityPropertiesThrombus Aging +---------+---------------+---------+-----------+----------+--------------+ CFV      Full           Yes      Yes                                 +---------+---------------+---------+-----------+----------+--------------+ SFJ      Full           Yes      Yes                                 +---------+---------------+---------+-----------+----------+--------------+ FV Prox  Full           Yes      Yes                                 +---------+---------------+---------+-----------+----------+--------------+ FV Mid   Full           Yes      Yes                                 +---------+---------------+---------+-----------+----------+--------------+  [facility] CARDIOLOGY CONSULT NOTE       Patient ID: Manuel [name] MRN: [id number] DOB/AGE: Feb 24, 1946 77 y.o.  Admit date: 01/15/2023 Referring Physician Dr. Doree Albee Primary Physician Dr. Marcelino Duster Primary Cardiologist Dr. Lorelle Gibbs Reason for Consultation AoCHF  HPI: Manuel [name] is a 77 y.o. 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 Cardiology was consulted for further evaluation.   Patient states that he was in his usual state of health until last night whenever he was unable to sleep.  States that every time he would lay down he would become very short of breath and would be gasping for air.  States that he was unable to lie flat and thus did not get any sleep because of this.  He also reports severe right ankle pain last night that was sharp in nature and would last for few seconds and resolved.  This sensation came and went throughout the night and was very painful.  After dealing with the shortness of breath and ankle pain all night he decided to have his wife call EMS this morning to bring him to the ED for evaluation.  Workup in the ED notable for chest x-ray with vascular congestion, BNP elevated to 1700 troponins elevated in the 400s and flat.  He received IV Lasix in the ED and reports good urine output with this.  At the time of my evaluation this afternoon patient is sitting upright on side of hospital bed with wife and son present at bedside.  States that shortness of breath has improved some overall, states that he was able to get some sleep earlier today.  He is currently on supplemental oxygen at 2 L [employer].  States that over the last few weeks he  has noticed worsening lower extremity edema.  He denies any recent issues with chest pain, palpitations, dizziness, dyspnea on exertion.  Denies any recent problems with shortness of breath until last night when he became orthopneic.  He also expressed concern that despite his multiple vascular procedures he continues to have pain in his lower extremities.  Review of systems complete and found to be negative unless listed above    Past Medical History:  Diagnosis Date   Acute ST elevation myocardial infarction (STEMI) of inferior wall (HCC) 04/19/2010   a.) transfered from [facility] to [facility] --> LHC/PCI (very difficult procedure) --> 3.0 x 23 mm and 3.0 x 12 mm Xience stents to RCA   Allergies    Arthritis    Benign essential hypertension    Bilateral carotid artery disease (HCC) 05/08/2021   a.) carotid doppler 05/08/2021: 1-39% BICA   CAD (coronary artery disease) 04/19/2010   a.) inferior STEMI 04/19/2010 --> LHC/PCI: 50-70% pD1, 80% pRI, 90/90/90% RCA (overlapping 3.0 x 23 and 3.0 x 12 mm Xience DES); b.) MV 11/10/2018: fixed minimally reversible inferior perfusion defect   Cellulitis of foot    DDD (degenerative disc disease), cervical    Diabetes mellitus type 2, insulin dependent (HCC)    Diverticulosis    Full dentures    Gout    Hard of hearing    History of bilateral cataract extraction 2022   History of ETOH  [facility] CARDIOLOGY CONSULT NOTE       Patient ID: Manuel [name] MRN: [id number] DOB/AGE: Feb 24, 1946 77 y.o.  Admit date: 01/15/2023 Referring Physician Dr. Doree Albee Primary Physician Dr. Marcelino Duster Primary Cardiologist Dr. Lorelle Gibbs Reason for Consultation AoCHF  HPI: Manuel [name] is a 77 y.o. 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 Cardiology was consulted for further evaluation.   Patient states that he was in his usual state of health until last night whenever he was unable to sleep.  States that every time he would lay down he would become very short of breath and would be gasping for air.  States that he was unable to lie flat and thus did not get any sleep because of this.  He also reports severe right ankle pain last night that was sharp in nature and would last for few seconds and resolved.  This sensation came and went throughout the night and was very painful.  After dealing with the shortness of breath and ankle pain all night he decided to have his wife call EMS this morning to bring him to the ED for evaluation.  Workup in the ED notable for chest x-ray with vascular congestion, BNP elevated to 1700 troponins elevated in the 400s and flat.  He received IV Lasix in the ED and reports good urine output with this.  At the time of my evaluation this afternoon patient is sitting upright on side of hospital bed with wife and son present at bedside.  States that shortness of breath has improved some overall, states that he was able to get some sleep earlier today.  He is currently on supplemental oxygen at 2 L [employer].  States that over the last few weeks he  has noticed worsening lower extremity edema.  He denies any recent issues with chest pain, palpitations, dizziness, dyspnea on exertion.  Denies any recent problems with shortness of breath until last night when he became orthopneic.  He also expressed concern that despite his multiple vascular procedures he continues to have pain in his lower extremities.  Review of systems complete and found to be negative unless listed above    Past Medical History:  Diagnosis Date   Acute ST elevation myocardial infarction (STEMI) of inferior wall (HCC) 04/19/2010   a.) transfered from [facility] to [facility] --> LHC/PCI (very difficult procedure) --> 3.0 x 23 mm and 3.0 x 12 mm Xience stents to RCA   Allergies    Arthritis    Benign essential hypertension    Bilateral carotid artery disease (HCC) 05/08/2021   a.) carotid doppler 05/08/2021: 1-39% BICA   CAD (coronary artery disease) 04/19/2010   a.) inferior STEMI 04/19/2010 --> LHC/PCI: 50-70% pD1, 80% pRI, 90/90/90% RCA (overlapping 3.0 x 23 and 3.0 x 12 mm Xience DES); b.) MV 11/10/2018: fixed minimally reversible inferior perfusion defect   Cellulitis of foot    DDD (degenerative disc disease), cervical    Diabetes mellitus type 2, insulin dependent (HCC)    Diverticulosis    Full dentures    Gout    Hard of hearing    History of bilateral cataract extraction 2022   History of ETOH  01/15/23 0754  WBC 14.0*  NEUTROABS 11.7*  HGB 11.7*  HCT 35.7*  MCV 85.0  PLT 345   Cardiac Enzymes: Recent Labs    01/15/23 0754 01/15/23 1011  TROPONINIHS 431* 475*   BNP: Recent Labs    01/15/23 0754  BNP 1,760.5*   D-Dimer: No results for input(s): "DDIMER" in the last 72 hours. Hemoglobin A1C: No results for input(s): "HGBA1C" in the last 72 hours. Fasting Lipid Panel: No results for input(s): "CHOL", "HDL", "LDLCALC", "TRIG", "CHOLHDL", "LDLDIRECT" in the last 72 hours. Thyroid Function Tests: No results for input(s): "TSH", "T4TOTAL", "T3FREE", "THYROIDAB" in the last 72 hours.  Invalid input(s): "FREET3" Anemia Panel: No results for input(s): "VITAMINB12", "FOLATE", "FERRITIN", "TIBC", "IRON", "RETICCTPCT" in the last 72 hours.   Radiology: DG Chest Portable 1 View  Result Date: 01/15/2023 CLINICAL DATA:  Chest pain EXAM: PORTABLE CHEST 1 VIEW COMPARISON:  11/12/2022 FINDINGS: Low volume chest with cardiomegaly and congested appearance of vessels. There is no edema, consolidation, effusion, or pneumothorax. Remote right rib fractures. Bilateral shoulder degeneration with chronic cystic change on both sides of the right glenohumeral joint. IMPRESSION: Low volume chest with cardiomegaly and mild vascular congestion. Electronically Signed   By: Tiburcio Pea M.D.   On: 01/15/2023 07:39   VAS [place] LOWER EXTREMITY VENOUS (DVT)  Result Date: 01/06/2023  Lower Venous DVT Study Patient Name:  Manuel [name]  Date of Exam:   01/01/2023 Medical Rec #: [id number]         Accession #:    [id number] Date of Birth: [birth year]         Patient Gender: M Patient Age:   40 years Exam Location:  [facility] Procedure:      VAS [place] LOWER EXTREMITY VENOUS (DVT) Referring Phys: Sheppard Plumber  --------------------------------------------------------------------------------  Indications: Pain, Swelling, and Edema.  Risk Factors: Surgery Bilateral femoral endarterectomies 11/15/2022. Performing Technologist: Hardie Lora RVT  Examination Guidelines: A complete evaluation includes B-mode imaging, spectral Doppler, color Doppler, and power Doppler as needed of all accessible portions of each vessel. Bilateral testing is considered an integral part of a complete examination. Limited examinations for reoccurring indications may be performed as noted. The reflux portion of the exam is performed with the patient in reverse Trendelenburg.  +---------+---------------+---------+-----------+----------+--------------+ RIGHT    CompressibilityPhasicitySpontaneityPropertiesThrombus Aging +---------+---------------+---------+-----------+----------+--------------+ CFV      Full           Yes      Yes                                 +---------+---------------+---------+-----------+----------+--------------+ SFJ      Full           Yes      Yes                                 +---------+---------------+---------+-----------+----------+--------------+ FV Prox  Full           Yes      Yes                                 +---------+---------------+---------+-----------+----------+--------------+ FV Mid   Full           Yes      Yes                                 +---------+---------------+---------+-----------+----------+--------------+

## 2023-01-15 NOTE — Assessment & Plan Note (Signed)
Troponin into the 400s in the setting of active decompensated heart failure EKG stable No active chest pain Suspect secondary demand ischemia in setting of heart failure exacerbation On heparin drip Cardiology consulted Follow-up recommendations

## 2023-01-16 ENCOUNTER — Other Ambulatory Visit (HOSPITAL_COMMUNITY): Payer: Self-pay

## 2023-01-16 DIAGNOSIS — I214 Non-ST elevation (NSTEMI) myocardial infarction: Secondary | ICD-10-CM | POA: Diagnosis not present

## 2023-01-16 LAB — VITAMIN D 25 HYDROXY (VIT D DEFICIENCY, FRACTURES): Vit D, 25-Hydroxy: 31.27 ng/mL (ref 30–100)

## 2023-01-16 LAB — GLUCOSE, CAPILLARY
Glucose-Capillary: 142 mg/dL — ABNORMAL HIGH (ref 70–99)
Glucose-Capillary: 188 mg/dL — ABNORMAL HIGH (ref 70–99)
Glucose-Capillary: 102 mg/dL — ABNORMAL HIGH (ref 70–99)
Glucose-Capillary: 160 mg/dL — ABNORMAL HIGH (ref 70–99)

## 2023-01-16 LAB — COMPREHENSIVE METABOLIC PANEL
ALT: 17 U/L (ref 0–44)
AST: 22 U/L (ref 15–41)
Albumin: 2.9 g/dL — ABNORMAL LOW (ref 3.5–5.0)
Alkaline Phosphatase: 61 U/L (ref 38–126)
Anion gap: 10 (ref 5–15)
BUN: 29 mg/dL — ABNORMAL HIGH (ref 8–23)
CO2: 24 mmol/L (ref 22–32)
Calcium: 8.3 mg/dL — ABNORMAL LOW (ref 8.9–10.3)
Chloride: 94 mmol/L — ABNORMAL LOW (ref 98–111)
Creatinine, Ser: 0.98 mg/dL (ref 0.61–1.24)
GFR, Estimated: >60 mL/min (ref >60)
Glucose, Bld: 176 mg/dL — ABNORMAL HIGH (ref 70–99)
Potassium: 4.2 mmol/L (ref 3.5–5.1)
Sodium: 128 mmol/L — ABNORMAL LOW (ref 135–145)
Total Bilirubin: 0.9 mg/dL (ref 0.3–1.2)
Total Protein: 6.6 g/dL (ref 6.5–8.1)

## 2023-01-16 LAB — CBC
HCT: 32.9 % — ABNORMAL LOW (ref 39.0–52.0)
Hemoglobin: 11.1 g/dL — ABNORMAL LOW (ref 13.0–17.0)
MCH: 28.5 pg (ref 26.0–34.0)
MCHC: 33.7 g/dL (ref 30.0–36.0)
MCV: 84.6 fL (ref 80.0–100.0)
Platelets: 292 10*3/uL (ref 150–400)
RBC: 3.89 MIL/uL — ABNORMAL LOW (ref 4.22–5.81)
RDW: 13 % (ref 11.5–15.5)
WBC: 13.5 10*3/uL — ABNORMAL HIGH (ref 4.0–10.5)
nRBC: 0 % (ref 0.0–0.2)

## 2023-01-16 LAB — IRON AND TIBC
Iron: 17 ug/dL — ABNORMAL LOW (ref 45–182)
Saturation Ratios: 7 % — ABNORMAL LOW (ref 17.9–39.5)
TIBC: 245 ug/dL — ABNORMAL LOW (ref 250–450)
UIBC: 228 ug/dL

## 2023-01-16 LAB — OSMOLALITY: Osmolality: 282 mOsm/kg (ref 275–295)

## 2023-01-16 LAB — FOLATE: Folate: 16.1 ng/mL (ref >5.9)

## 2023-01-16 LAB — MAGNESIUM: Magnesium: 1.9 mg/dL (ref 1.7–2.4)

## 2023-01-16 LAB — HEPARIN LEVEL (UNFRACTIONATED): Heparin Unfractionated: <0.1 IU/mL — ABNORMAL LOW (ref 0.30–0.70)

## 2023-01-16 LAB — VITAMIN B12: Vitamin B-12: 428 pg/mL (ref 180–914)

## 2023-01-16 MED ORDER — ORAL CARE MOUTH RINSE: 15.0000 mL | OROMUCOSAL | Status: DC | PRN

## 2023-01-16 MED ORDER — ALUM & MAG HYDROXIDE-SIMETH 200-200-20 MG/5ML PO SUSP
30.0000 mL | ORAL | Status: DC | PRN
  Administered 2023-01-16 (×2): 30 mL via ORAL
  Filled 2023-01-16 (×2): qty 30

## 2023-01-16 MED ORDER — HYDROMORPHONE HCL 1 MG/ML IJ SOLN
1.0000 mg | INTRAMUSCULAR | Status: DC | PRN
  Administered 2023-01-16 (×4): 1 mg via INTRAVENOUS
  Filled 2023-01-16 (×4): qty 1

## 2023-01-16 MED ORDER — SPIRONOLACTONE 25 MG PO TABS
50.0000 mg | ORAL_TABLET | Freq: Every day | ORAL | Status: DC
  Administered 2023-01-16 – 2023-01-19 (×4): 50 mg via ORAL
  Filled 2023-01-16 (×4): qty 2

## 2023-01-16 MED ORDER — ENOXAPARIN SODIUM 60 MG/0.6ML IJ SOSY
0.5000 mg/kg | PREFILLED_SYRINGE | INTRAMUSCULAR | Status: DC
  Administered 2023-01-16 – 2023-01-18 (×3): 45 mg via SUBCUTANEOUS
  Filled 2023-01-16 (×3): qty 0.6

## 2023-01-16 MED ORDER — ASPIRIN 81 MG PO TBEC
81.0000 mg | DELAYED_RELEASE_TABLET | Freq: Every day | ORAL | Status: DC
  Administered 2023-01-16 – 2023-01-19 (×4): 81 mg via ORAL
  Filled 2023-01-16 (×4): qty 1

## 2023-01-16 NOTE — Progress Notes (Signed)
Physical Therapy Treatment Patient Details Name: Manuel [name] MRN: [id number] DOB: 1946/01/07 Today's Date: 01/16/2023   History of Present Illness Patient is a 77 y.o. 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  Patient reports worsening shortness of breath, increased work of breathing over the past 4 to 5 days. Current MD assessment:  NSTEMI, Acute on chronic combined systolic and diastolic CHF, Ischemic ulcer of lower extremity, and T2DM.    PT Comments  Pt was pleasant and motivated to participate during the session and put forth good effort throughout. Pt found seated EOB with family in room. Donned wedge shoe with with pt seated. Pt able to perform STS at supervision level with RW, from lowered bed height with safe self selected strategy. Once up, pt able to amb ~100 feet with slowed cadence and speed using RW, with CGA. Pt needing to take a few standing rest breaks throughout amb, walking with steady step-to pattern with overall improved gait mechanics compared to prior session, cues provided for upright posture throughout amb bout. Towards end of ambulatory bout pt left in care of OT. Pt will benefit from continued PT services upon discharge to safely address deficits listed in patient problem list for decreased caregiver assistance and eventual return to PLOF.      If plan is discharge home, recommend the following: A little help with walking and/or transfers;Assist for transportation;Help with stairs or ramp for entrance;A little help with bathing/dressing/bathroom   Can travel by private vehicle        Equipment Recommendations  None recommended by PT    Recommendations for  Other Services       Precautions / Restrictions Precautions Precautions: Fall Restrictions Weight Bearing Restrictions: Yes Other Position/Activity Restrictions: per secure chat with Dr. Alberteen Spindle "wedge surgical shoe. Pressure only on the heel when walking"     Mobility  Bed Mobility               General bed mobility comments: pt seated EOB upon entry to room    Transfers Overall transfer level: Needs assistance Equipment used: Rolling walker (2 wheels) Transfers: Sit to/from Stand Sit to Stand: Supervision           General transfer comment: slow, req no physical assist    Ambulation/Gait Ambulation/Gait assistance: Contact guard assist Gait Distance (Feet): 100 Feet Assistive device: Rolling walker (2 wheels) Gait Pattern/deviations: Step-to pattern, Decreased step length - right, Decreased step length - left Gait velocity: decreased     General Gait Details: step to leading with RLE, overall steady with gait pattern, showing improved gait mechanics compared to prior session. Light cues for upright posture   Stairs             Wheelchair Mobility     Tilt Bed    Modified Rankin (Stroke Patients Only)       Balance Overall balance assessment: Needs assistance Sitting-balance support: Feet supported Sitting balance-Leahy Scale: Normal     Standing balance support: During functional activity, Single extremity supported, Bilateral upper extremity supported Standing balance-Leahy Scale: Fair Standing balance comment: static standing with RW  Cognition Arousal: Alert Behavior During Therapy: WFL for tasks assessed/performed Overall Cognitive Status: Within Functional Limits for tasks assessed                                          Exercises      General Comments        Pertinent Vitals/Pain Pain Assessment Pain Assessment: Faces Faces Pain Scale: No hurt    Home Living                           Prior Function            PT Goals (current goals can now be found in the care plan section) Progress towards PT goals: Progressing toward goals    Frequency    Min 1X/week      PT Plan      Co-evaluation              AM-PAC PT "6 Clicks" Mobility   Outcome Measure  Help needed turning from your back to your side while in a flat bed without using bedrails?: A Little Help needed moving from lying on your back to sitting on the side of a flat bed without using bedrails?: A Little Help needed moving to and from a bed to a chair (including a wheelchair)?: A Little Help needed standing up from a chair using your arms (e.g., wheelchair or bedside chair)?: A Little Help needed to walk in hospital room?: A Little Help needed climbing 3-5 steps with a railing? : A Little 6 Click Score: 18    End of Session   Activity Tolerance: Patient tolerated treatment well Patient left: Other (comment) (hand-off; in care of OT) Nurse Communication: Mobility status PT Visit Diagnosis: Other abnormalities of gait and mobility (R26.89);Difficulty in walking, not elsewhere classified (R26.2);Unsteadiness on feet (R26.81)     Time: 1610-9604 PT Time Calculation (min) (ACUTE ONLY): 16 min  Charges:                            Cecile Sheerer, SPT 01/16/23, 2:05 PM

## 2023-01-16 NOTE — Progress Notes (Signed)
[facility] Progress Note  Patient: Manuel [name]    BMW:[id number]  DOA: 01/15/2023     Date of Service: the patient was seen and examined on 01/16/2023  Chief Complaint  Patient presents with   Abdominal Pain   Brief hospital course: Manuel [name] is a 77 y.o. male with medical history significant of HFrEF, insulin-dependent diabetes, hypertension, CAD, PAD status post angioplasty with stent 2023 as well as recent admission for lower limb ischemia with multiple vascular procedures July through August 2024 presenting with acute on chronic HFrEF.  Patient reports worsening shortness of breath, increased work of breathing over the past 4 to 5 days.  Does also report neuropathic pain with symptoms being poorly controlled with Neurontin.  Positive orthopnea, PND.  Does report?  Partial compliance with diuretic regimen.  No reported high salt or NSAID intake.  Prior alcohol use.  No reported active alcohol use at present.  No abdominal pain.  No diar white count 14, hemoglobin 11.7, platelets 345, troponin 430s to 470s.  Creatinine 0.93.  Glucose 180s.  BNP is 1760.  Chest x-ray positive cardiomegaly.  Rhea.  Recently had I&D done with vascular surgery September 19.  Wound VAC has been stable. Presented to the ER afebrile, hemodynamically stable.  Satting well on room air.   Assessment and Plan:  NSTEMI (non-ST elevated myocardial infarction)  Troponin into the 400s in the setting of active decompensated heart failure EKG stable No active chest pain Suspect secondary demand ischemia in setting of heart failure exacerbation S/p heparin drip, DC'd on 9/26 as per cardiology no need Continued aspirin and Plavix Cardiology consulted Follow-up recommendations   Acute on chronic combined systolic and diastolic CHF (congestive heart failure)  2D echo July 2024 with EF of 20 to 25% Acute decompensated HFrEF with increased work of breathing, orthopnea, PND as well as lower extremity swelling  over the past few days BNP 1700 with noted cardiomegaly on chest x-ray Status post IV Lasix in the ER Continue with diuresis as well as heart failure protocol Follow-up [facility] cardiology recommendations Monitor   Ischemic ulcer of lower extremity  Baseline hx/o PVD  post angioplasty and stenting January 2023 Noted admission at the end of July for active issues including worsening ulcer formation across multiple digits of the foot with concern for gangrene Vascular procedure with extensive peripheral vascular disease, second procedure for bilateral endarterectomies and bilateral iliac stent placement  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 Recent wound debridement by vascular surgery September 19 Wound VAC in place and appears to be stable-CDI  Will otherwise monitor for now On heparin drip in the setting of NSTEMI Continue statin Vascular surgery consulted Wound care RN consulted back management     Diabetic polyneuropathy associated with type 2 diabetes mellitus (HCC) Patient with worsening pain despite oral Neurontin regimen Will bridge with IV pain medication for now Consider discussion with vascular surgery to better assess pain regimen in the setting of PAD Follow-up   Hypertension BP stable  Titrate home regimen      Diabetes mellitus type 2, insulin dependent (HCC) SSI  A1C    Alcohol abuse Noted prior hx/o ETOH use  No reported active ETOH use at present  Monitor      Mixed hyperlipidemia Statin    Body mass index is 30.17 kg/m.  Interventions:  Diet: Carb modified diet DVT Prophylaxis: Subcutaneous Lovenox   Advance goals of care discussion: DNR  Family Communication: family was present at bedside,  at the time of interview.  The pt provided permission to discuss medical plan with the family. Opportunity was given to ask question and all questions were answered satisfactorily.   Disposition:  Pt is from Home, admitted with CHF, still on Iv  lasix, which precludes a safe discharge. Discharge to Home, when stable and cleared by cardiology.  Subjective: No significant overnight events, patient did sleep well without any orthopnea.  Feels improvement in the shortness of breath, still has lower extremity edema.  Right inguinal wound VAC may not be working properly, will consult wound VAC and informed vascular surgery Complaining of lower extremity pain due to neuropathy-on   Physical Exam: General: NAD, lying comfortably Appear in no distress, affect appropriate Eyes: PERRLA ENT: Oral Mucosa Clear, moist  Neck: no JVD,  Cardiovascular: S1 and S2 Present, no Murmur,  Respiratory: good respiratory effort, Bilateral Air entry equal and Decreased, no Crackles, no wheezes Abdomen: Bowel Sound present, Soft and no tenderness, Left groin wound VAC attached Skin: no rashes Extremities: 3-4+ pedal edema, no calf tenderness Neurologic: without any new focal findings Gait not checked due to patient safety concerns  Vitals:   01/16/23 0454 01/16/23 0500 01/16/23 0758 01/16/23 1224  BP: 123/79  (!) 142/77 (!) 120/55  Pulse: 78  72 70  Resp: 17  20 20   Temp: 97.6 F (36.4 C)  98 F (36.7 C) 99.2 F (37.3 C)  TempSrc: Oral  Oral Oral  SpO2: 96%  100% 95%  Weight:  90 kg    Height:        Intake/Output Summary (Last 24 hours) at 01/16/2023 1519 Last data filed at 01/16/2023 0900 Gross per 24 hour  Intake 477.62 ml  Output 100 ml  Net 377.62 ml   Filed Weights   01/15/23 0724 01/16/23 0011 01/16/23 0500  Weight: 84.4 kg 90 kg 90 kg    Data Reviewed: I have personally reviewed and interpreted daily labs, tele strips, imagings as discussed above. I reviewed all nursing notes, pharmacy notes, vitals, pertinent old records I have discussed plan of care as described above with RN and patient/family.  CBC: Recent Labs  Lab 01/15/23 0754 01/16/23 0151  WBC 14.0* 13.5*  NEUTROABS 11.7*  --   HGB 11.7* 11.1*  HCT 35.7* 32.9*   MCV 85.0 84.6  PLT 345 292   Basic Metabolic Panel: Recent Labs  Lab 01/15/23 0754 01/16/23 0151  NA 129* 128*  K 4.5 4.2  CL 97* 94*  CO2 23 24  GLUCOSE 187* 176*  BUN 23 29*  CREATININE 0.93 0.98  CALCIUM 8.5* 8.3*  MG  --  1.9    Studies: No results found.  Scheduled Meds:  aspirin EC  81 mg Oral Daily   atorvastatin  80 mg Oral QHS   carvedilol  25 mg Oral BID WC   clopidogrel  75 mg Oral Daily   enoxaparin (LOVENOX) injection  0.5 mg/kg Subcutaneous Q24H   furosemide  60 mg Intravenous BID   gabapentin  800 mg Oral BID   insulin aspart  0-5 Units Subcutaneous QHS   insulin aspart  0-9 Units Subcutaneous TID WC   insulin aspart  3 Units Subcutaneous TID WC   losartan  25 mg Oral Daily   sodium chloride flush  3 mL Intravenous Q12H   spironolactone  50 mg Oral Daily   Continuous Infusions:  sodium chloride     PRN Meds: sodium chloride, alum & mag hydroxide-simeth, HYDROmorphone (DILAUDID) injection, ondansetron **OR**  ondansetron (ZOFRAN) IV, mouth rinse, sodium chloride flush  Time spent: 35 minutes  Author: Gillis Santa. MD [facility] 01/16/2023 3:19 PM  To reach On-call, see care teams to locate the attending and reach out to them via [email or web address]. If 7PM-7AM, please contact night-coverage If you still have difficulty reaching the attending provider, please page the [facility] (Director on Call) for [facility] on amion for assistance.

## 2023-01-16 NOTE — Progress Notes (Signed)
[facility] HF Stewardship  PCP: Gracelyn Nurse, MD  PCP-Cardiologist: None  HPI: Manuel [name] is a 77 y.o. male with  HFrEF, insulin-dependent diabetes, hypertension, CAD, PAD status post angioplasty with stent 2023 as well as recent admission for lower limb ischemia with multiple vascular procedures July through August 2024 who presented with worsening shortness of breath over 4-5 days. Troponin in the 400s on admission with normal lactic acid and elevated BNP of 1750.5.  Pertinent cardiac history: Acute inferior MI in 2011 treated with 2 DES. Cardiac cath in 2013 showed 80% ramus, 50-70% proximal first diagonal, and multiple 90% lesions of RCA treated with 3 overlapping DES. TTE in 2015 showed normal LVEF. TTE in 2020 showed LVEF of 40-45%. TTE in 10/2022 showed LVEF of 25-30% with mild-moderate MR and TR. Previous history of vascular disease s/p intervention in 2023 and 2024.  Pertinent Lab Values: Creatinine  Date Value Ref Range Status  02/16/2012 0.72 0.60 - 1.30 mg/dL Final   Creatinine, Ser  Date Value Ref Range Status  01/16/2023 0.98 0.61 - 1.24 mg/dL Final   BUN  Date Value Ref Range Status  01/16/2023 29 (H) 8 - 23 mg/dL Final  81/19/1478 10 7 - 18 mg/dL Final   Potassium  Date Value Ref Range Status  01/16/2023 4.2 3.5 - 5.1 mmol/L Final  02/16/2012 3.8 3.5 - 5.1 mmol/L Final   Sodium  Date Value Ref Range Status  01/16/2023 128 (L) 135 - 145 mmol/L Final  02/16/2012 137 136 - 145 mmol/L Final   B Natriuretic Peptide  Date Value Ref Range Status  01/15/2023 1,760.5 (H) 0.0 - 100.0 pg/mL Final    Comment:    Performed at [facility], [address] [place], [place] [address]   Magnesium  Date Value Ref Range Status  01/16/2023 1.9 1.7 - 2.4 mg/dL Final    Comment:    Performed at [facility], [address] [place], [place] [address]   Hgb A1c MFr Bld  Date Value Ref Range Status  11/10/2022 6.9 (H) 4.8 - 5.6 % Final    Comment:     (NOTE)         Prediabetes: 5.7 - 6.4         Diabetes: >6.4         Glycemic control for adults with diabetes: <7.0     Vital Signs: Temp:  [97.6 F (36.4 C)-99 F (37.2 C)] 98 F (36.7 C) (09/26 0758) Pulse Rate:  [72-94] 72 (09/26 0758) Cardiac Rhythm: Ventricular tachycardia;Other (Comment) (09/26 0056) Resp:  [14-37] 20 (09/26 0758) BP: (104-168)/(61-134) 142/77 (09/26 0758) SpO2:  [93 %-100 %] 100 % (09/26 0758) Weight:  [90 kg (198 lb 6.6 oz)] 90 kg (198 lb 6.6 oz) (09/26 0500)   Intake/Output Summary (Last 24 hours) at 01/16/2023 0806 Last data filed at 01/16/2023 0400 Gross per 24 hour  Intake 51.1 ml  Output --  Net 51.1 ml    Current Inpatient Medications:  -Furosemide 60 mg IV BID -Carvedilol 25 mg BID -Losartan 25 mg BID  Prior to admission HF Medications:  -Torsemide 20 mg daily -Carvedilol 25 mg BID -Losartan 25 mg daily -Spironolactone 50 mg daily  Assessment: 1. Acute on chronic systolic heart failure (LVEF 25-30%), due to ICM. NYHA class II-III symptoms.  -BP now elevated. Home losartan ordered. Pending BP trends, can consider transition to [facility] for additional benefit to readmission and mortality.  -I/Os not charted. Admit weight recorded at 198.41. Currently  on furosemide 60 mg IV BID. Creatinine stable, BUN trending up. Can continue diuresis x 1 more day. -Can consider adding home spironolactone  -Can consider adding SGLT2i -During heart failure education, the patient mentioned that they would like to have a conversation about advanced directives. The patient was told that this is not within the scope of practice for this role. Discussed with cardiology afterwards. Will continue to provide recommendations unless goals of care change.  Plan: 1) Medication changes recommended at this time: -Consider adding home spironolactone at 50 mg daily -Consider adding Farxiga 10 mg daily  2) Patient assistance: -Sherryll Burger is $47.00 -Marcelline Deist is $47.00  for brand and generic not covered -Jardiance is $47.00   3) Education: - Patient has been educated on current HF medications and potential additions to HF medication regimen - Patient verbalizes understanding that over the next few months, these medication doses may change and more medications may be added to optimize HF regimen - Patient has been educated on basic disease state pathophysiology and goals of therapy  Medication Assistance / Insurance Benefits Check:  Does the patient have prescription insurance?    Type of insurance plan:   Does the patient qualify for medication assistance through manufacturers or grants? No  Outpatient Pharmacy:  Prior to admission outpatient pharmacy: [facility]     Thank you for involving pharmacy in this patient's care.  Enos Fling, PharmD, BCPS Phone - [phone] Clinical Pharmacist 01/16/2023 11:14 AM

## 2023-01-16 NOTE — Progress Notes (Signed)
[facility] CLINIC CARDIOLOGY CONSULT NOTE       Patient ID: Manuel [name] MRN: [id number] DOB/AGE: 1946-03-24 77 y.o.  Admit date: 01/15/2023 Referring Physician Dr. Doree Albee Primary Physician Dr. Marcelino Duster Primary Cardiologist Dr. Lorelle Gibbs Reason for Consultation AoCHF  HPI: Manuel [name] is a 77 y.o. 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 Cardiology was consulted for further evaluation.   Interval history: -Patient reports feeling better overall this AM.  -Reports SOB has improved, denies any orthopnea overnight. Weaned to RA.  -BP elevated, renal function stable with IV diuresis. Reports good UOP.   Review of systems complete and found to be negative unless listed above    Past Medical History:  Diagnosis Date   Acute ST elevation myocardial infarction (STEMI) of inferior wall (HCC) 04/19/2010   a.) transfered from [facility] to [facility] --> LHC/PCI (very difficult procedure) --> 3.0 x 23 mm and 3.0 x 12 mm Xience stents to RCA   Allergies    Arthritis    Benign essential hypertension    Bilateral carotid artery disease (HCC) 05/08/2021   a.) carotid doppler 05/08/2021: 1-39% BICA   CAD (coronary artery disease) 04/19/2010   a.) inferior STEMI 04/19/2010 --> LHC/PCI: 50-70% pD1, 80% pRI, 90/90/90% RCA (overlapping 3.0 x 23 and 3.0 x 12 mm Xience DES); b.) MV 11/10/2018: fixed minimally reversible inferior perfusion defect   Cellulitis of foot    DDD (degenerative disc disease), cervical    Diabetes mellitus type 2, insulin dependent (HCC)    Diverticulosis    Full dentures    Gout    Hard of hearing    History of bilateral cataract extraction 2022    History of ETOH abuse    Hyperlipidemia    Ischemic cardiomyopathy 04/19/2010   a.) TTE 04/19/2010: 40%; b.) TTE 04/20/2014: EF >55%, mild RVE, triv PR, mild MR/TR, G1DD; c.) TTE 11/10/2018: EF 45%, inf HK, mild RVE, triv TR/PR, mild MR, G1DD; d.) TTE 11/14/2022: EF 25-30%, basal anteroseptal, apical lateral, apical septal, and apex AK, mid anterolateral HK, mild-mod MR/TR   Long term current use of aspirin    Long term current use of clopidogrel    Lumbar degenerative disc disease    Lumbar radiculopathy    Lumbar vertebral fracture (chronic superior endplate of L1)    OSA (obstructive sleep apnea)    a.) unable to tolerate nocturnal PAP therapy   Peripheral artery disease (HCC)    a.) stenting 05/14/21: 12 mm x 12 cm LifeStent RIGHT dis SFA/prox pop; b.) s/p cath directed thrombolysis RIGHT SFA/pop 06/14/21; c.) s/p mech thrombectomy + stenting 06/15/21: 8 mm x 25cm & 8 mm x 7.5cm Viabahn; d.) s/p BILAT CFA, profunda femoris, SFA endarterectomies + fogarty embolectomy + stenting 11/15/22: 12mm x 58mm Lifestream BILAT CIAs, 14 mm x 6 cm Lifestream & 13 mm x 5 cm Viabahn LEFT EIA   Peripheral neuropathy    Umbilical hernia     Past Surgical History:  Procedure Laterality Date   AMPUTATION Right 11/18/2022   Procedure: AMPUTATION 4TH AND 5TH RAY;  Surgeon: Linus Galas, [name];  Location: [facility] ORS;  Service: Orthopedics/Podiatry;  Laterality: Right;  4th and 5th toe  0.93 0.98  CALCIUM 8.5* 8.3*  MG  --  1.9   Liver Function Tests: Recent Labs    01/15/23 0754 01/16/23 0151  AST 18 22  ALT 12 17  ALKPHOS 59 61  BILITOT 1.2 0.9  PROT 6.5 6.6  ALBUMIN 3.1* 2.9*   No results for input(s): "LIPASE", "AMYLASE" in the last 72 hours. CBC: Recent Labs    01/15/23 0754 01/16/23 0151  WBC 14.0* 13.5*  NEUTROABS 11.7*  --   HGB 11.7* 11.1*  HCT 35.7* 32.9*  MCV 85.0 84.6  PLT 345 292   Cardiac Enzymes: Recent Labs    01/15/23 0754 01/15/23 1011  TROPONINIHS 431* 475*   BNP: Recent Labs    01/15/23 0754  BNP 1,760.5*   D-Dimer: No results for input(s): "DDIMER" in the last 72 hours. Hemoglobin A1C: No results for input(s): "HGBA1C" in the last 72 hours. Fasting Lipid Panel: No results for input(s): "CHOL", "HDL", "LDLCALC", "TRIG", "CHOLHDL", "LDLDIRECT" in the last 72 hours. Thyroid Function Tests: No results for input(s): "TSH", "T4TOTAL", "T3FREE", "THYROIDAB" in the last 72 hours.  Invalid input(s): "FREET3" Anemia Panel: No results for input(s): "VITAMINB12", "FOLATE", "FERRITIN", "TIBC", "IRON", "RETICCTPCT" in the last 72 hours.   Radiology: DG Chest Portable 1 View  Result Date: 01/15/2023 CLINICAL DATA:  Chest pain EXAM: PORTABLE CHEST 1 VIEW COMPARISON:  11/12/2022 FINDINGS: Low volume chest with cardiomegaly and congested appearance of vessels. There is no edema, consolidation, effusion, or pneumothorax. Remote right rib fractures. Bilateral shoulder degeneration with chronic cystic change on both sides of the right glenohumeral joint. IMPRESSION: Low volume chest with cardiomegaly and mild vascular congestion. Electronically Signed   By: Tiburcio Pea M.D.    On: 01/15/2023 07:39   VAS [place] LOWER EXTREMITY VENOUS (DVT)  Result Date: 01/06/2023  Lower Venous DVT Study Patient Name:  Manuel [name]  Date of Exam:   01/01/2023 Medical Rec #: [id number]         Accession #:    [id number] Date of Birth: 1945-12-10         Patient Gender: M Patient Age:   69 years Exam Location:  [facility] Procedure:      VAS [place] LOWER EXTREMITY VENOUS (DVT) Referring Phys: Sheppard Plumber --------------------------------------------------------------------------------  Indications: Pain, Swelling, and Edema.  Risk Factors: Surgery Bilateral femoral endarterectomies 11/15/2022. Performing Technologist: Hardie Lora RVT  Examination Guidelines: A complete evaluation includes B-mode imaging, spectral Doppler, color Doppler, and power Doppler as needed of all accessible portions of each vessel. Bilateral testing is considered an integral part of a complete examination. Limited examinations for reoccurring indications may be performed as noted. The reflux portion of the exam is performed with the patient in reverse Trendelenburg.  +---------+---------------+---------+-----------+----------+--------------+ RIGHT    CompressibilityPhasicitySpontaneityPropertiesThrombus Aging +---------+---------------+---------+-----------+----------+--------------+ CFV      Full           Yes      Yes                                 +---------+---------------+---------+-----------+----------+--------------+ SFJ      Full           Yes      Yes                                 +---------+---------------+---------+-----------+----------+--------------+ FV Prox  Full  0.93 0.98  CALCIUM 8.5* 8.3*  MG  --  1.9   Liver Function Tests: Recent Labs    01/15/23 0754 01/16/23 0151  AST 18 22  ALT 12 17  ALKPHOS 59 61  BILITOT 1.2 0.9  PROT 6.5 6.6  ALBUMIN 3.1* 2.9*   No results for input(s): "LIPASE", "AMYLASE" in the last 72 hours. CBC: Recent Labs    01/15/23 0754 01/16/23 0151  WBC 14.0* 13.5*  NEUTROABS 11.7*  --   HGB 11.7* 11.1*  HCT 35.7* 32.9*  MCV 85.0 84.6  PLT 345 292   Cardiac Enzymes: Recent Labs    01/15/23 0754 01/15/23 1011  TROPONINIHS 431* 475*   BNP: Recent Labs    01/15/23 0754  BNP 1,760.5*   D-Dimer: No results for input(s): "DDIMER" in the last 72 hours. Hemoglobin A1C: No results for input(s): "HGBA1C" in the last 72 hours. Fasting Lipid Panel: No results for input(s): "CHOL", "HDL", "LDLCALC", "TRIG", "CHOLHDL", "LDLDIRECT" in the last 72 hours. Thyroid Function Tests: No results for input(s): "TSH", "T4TOTAL", "T3FREE", "THYROIDAB" in the last 72 hours.  Invalid input(s): "FREET3" Anemia Panel: No results for input(s): "VITAMINB12", "FOLATE", "FERRITIN", "TIBC", "IRON", "RETICCTPCT" in the last 72 hours.   Radiology: DG Chest Portable 1 View  Result Date: 01/15/2023 CLINICAL DATA:  Chest pain EXAM: PORTABLE CHEST 1 VIEW COMPARISON:  11/12/2022 FINDINGS: Low volume chest with cardiomegaly and congested appearance of vessels. There is no edema, consolidation, effusion, or pneumothorax. Remote right rib fractures. Bilateral shoulder degeneration with chronic cystic change on both sides of the right glenohumeral joint. IMPRESSION: Low volume chest with cardiomegaly and mild vascular congestion. Electronically Signed   By: Tiburcio Pea M.D.    On: 01/15/2023 07:39   VAS [place] LOWER EXTREMITY VENOUS (DVT)  Result Date: 01/06/2023  Lower Venous DVT Study Patient Name:  Manuel [name]  Date of Exam:   01/01/2023 Medical Rec #: [id number]         Accession #:    [id number] Date of Birth: 1945-12-10         Patient Gender: M Patient Age:   69 years Exam Location:  [facility] Procedure:      VAS [place] LOWER EXTREMITY VENOUS (DVT) Referring Phys: Sheppard Plumber --------------------------------------------------------------------------------  Indications: Pain, Swelling, and Edema.  Risk Factors: Surgery Bilateral femoral endarterectomies 11/15/2022. Performing Technologist: Hardie Lora RVT  Examination Guidelines: A complete evaluation includes B-mode imaging, spectral Doppler, color Doppler, and power Doppler as needed of all accessible portions of each vessel. Bilateral testing is considered an integral part of a complete examination. Limited examinations for reoccurring indications may be performed as noted. The reflux portion of the exam is performed with the patient in reverse Trendelenburg.  +---------+---------------+---------+-----------+----------+--------------+ RIGHT    CompressibilityPhasicitySpontaneityPropertiesThrombus Aging +---------+---------------+---------+-----------+----------+--------------+ CFV      Full           Yes      Yes                                 +---------+---------------+---------+-----------+----------+--------------+ SFJ      Full           Yes      Yes                                 +---------+---------------+---------+-----------+----------+--------------+ FV Prox  Full  0.93 0.98  CALCIUM 8.5* 8.3*  MG  --  1.9   Liver Function Tests: Recent Labs    01/15/23 0754 01/16/23 0151  AST 18 22  ALT 12 17  ALKPHOS 59 61  BILITOT 1.2 0.9  PROT 6.5 6.6  ALBUMIN 3.1* 2.9*   No results for input(s): "LIPASE", "AMYLASE" in the last 72 hours. CBC: Recent Labs    01/15/23 0754 01/16/23 0151  WBC 14.0* 13.5*  NEUTROABS 11.7*  --   HGB 11.7* 11.1*  HCT 35.7* 32.9*  MCV 85.0 84.6  PLT 345 292   Cardiac Enzymes: Recent Labs    01/15/23 0754 01/15/23 1011  TROPONINIHS 431* 475*   BNP: Recent Labs    01/15/23 0754  BNP 1,760.5*   D-Dimer: No results for input(s): "DDIMER" in the last 72 hours. Hemoglobin A1C: No results for input(s): "HGBA1C" in the last 72 hours. Fasting Lipid Panel: No results for input(s): "CHOL", "HDL", "LDLCALC", "TRIG", "CHOLHDL", "LDLDIRECT" in the last 72 hours. Thyroid Function Tests: No results for input(s): "TSH", "T4TOTAL", "T3FREE", "THYROIDAB" in the last 72 hours.  Invalid input(s): "FREET3" Anemia Panel: No results for input(s): "VITAMINB12", "FOLATE", "FERRITIN", "TIBC", "IRON", "RETICCTPCT" in the last 72 hours.   Radiology: DG Chest Portable 1 View  Result Date: 01/15/2023 CLINICAL DATA:  Chest pain EXAM: PORTABLE CHEST 1 VIEW COMPARISON:  11/12/2022 FINDINGS: Low volume chest with cardiomegaly and congested appearance of vessels. There is no edema, consolidation, effusion, or pneumothorax. Remote right rib fractures. Bilateral shoulder degeneration with chronic cystic change on both sides of the right glenohumeral joint. IMPRESSION: Low volume chest with cardiomegaly and mild vascular congestion. Electronically Signed   By: Tiburcio Pea M.D.    On: 01/15/2023 07:39   VAS [place] LOWER EXTREMITY VENOUS (DVT)  Result Date: 01/06/2023  Lower Venous DVT Study Patient Name:  Manuel [name]  Date of Exam:   01/01/2023 Medical Rec #: [id number]         Accession #:    [id number] Date of Birth: 1945-12-10         Patient Gender: M Patient Age:   69 years Exam Location:  [facility] Procedure:      VAS [place] LOWER EXTREMITY VENOUS (DVT) Referring Phys: Sheppard Plumber --------------------------------------------------------------------------------  Indications: Pain, Swelling, and Edema.  Risk Factors: Surgery Bilateral femoral endarterectomies 11/15/2022. Performing Technologist: Hardie Lora RVT  Examination Guidelines: A complete evaluation includes B-mode imaging, spectral Doppler, color Doppler, and power Doppler as needed of all accessible portions of each vessel. Bilateral testing is considered an integral part of a complete examination. Limited examinations for reoccurring indications may be performed as noted. The reflux portion of the exam is performed with the patient in reverse Trendelenburg.  +---------+---------------+---------+-----------+----------+--------------+ RIGHT    CompressibilityPhasicitySpontaneityPropertiesThrombus Aging +---------+---------------+---------+-----------+----------+--------------+ CFV      Full           Yes      Yes                                 +---------+---------------+---------+-----------+----------+--------------+ SFJ      Full           Yes      Yes                                 +---------+---------------+---------+-----------+----------+--------------+ FV Prox  Full  [facility] CLINIC CARDIOLOGY CONSULT NOTE       Patient ID: Manuel [name] MRN: [id number] DOB/AGE: 1946-03-24 77 y.o.  Admit date: 01/15/2023 Referring Physician Dr. Doree Albee Primary Physician Dr. Marcelino Duster Primary Cardiologist Dr. Lorelle Gibbs Reason for Consultation AoCHF  HPI: Manuel [name] is a 77 y.o. 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 Cardiology was consulted for further evaluation.   Interval history: -Patient reports feeling better overall this AM.  -Reports SOB has improved, denies any orthopnea overnight. Weaned to RA.  -BP elevated, renal function stable with IV diuresis. Reports good UOP.   Review of systems complete and found to be negative unless listed above    Past Medical History:  Diagnosis Date   Acute ST elevation myocardial infarction (STEMI) of inferior wall (HCC) 04/19/2010   a.) transfered from [facility] to [facility] --> LHC/PCI (very difficult procedure) --> 3.0 x 23 mm and 3.0 x 12 mm Xience stents to RCA   Allergies    Arthritis    Benign essential hypertension    Bilateral carotid artery disease (HCC) 05/08/2021   a.) carotid doppler 05/08/2021: 1-39% BICA   CAD (coronary artery disease) 04/19/2010   a.) inferior STEMI 04/19/2010 --> LHC/PCI: 50-70% pD1, 80% pRI, 90/90/90% RCA (overlapping 3.0 x 23 and 3.0 x 12 mm Xience DES); b.) MV 11/10/2018: fixed minimally reversible inferior perfusion defect   Cellulitis of foot    DDD (degenerative disc disease), cervical    Diabetes mellitus type 2, insulin dependent (HCC)    Diverticulosis    Full dentures    Gout    Hard of hearing    History of bilateral cataract extraction 2022    History of ETOH abuse    Hyperlipidemia    Ischemic cardiomyopathy 04/19/2010   a.) TTE 04/19/2010: 40%; b.) TTE 04/20/2014: EF >55%, mild RVE, triv PR, mild MR/TR, G1DD; c.) TTE 11/10/2018: EF 45%, inf HK, mild RVE, triv TR/PR, mild MR, G1DD; d.) TTE 11/14/2022: EF 25-30%, basal anteroseptal, apical lateral, apical septal, and apex AK, mid anterolateral HK, mild-mod MR/TR   Long term current use of aspirin    Long term current use of clopidogrel    Lumbar degenerative disc disease    Lumbar radiculopathy    Lumbar vertebral fracture (chronic superior endplate of L1)    OSA (obstructive sleep apnea)    a.) unable to tolerate nocturnal PAP therapy   Peripheral artery disease (HCC)    a.) stenting 05/14/21: 12 mm x 12 cm LifeStent RIGHT dis SFA/prox pop; b.) s/p cath directed thrombolysis RIGHT SFA/pop 06/14/21; c.) s/p mech thrombectomy + stenting 06/15/21: 8 mm x 25cm & 8 mm x 7.5cm Viabahn; d.) s/p BILAT CFA, profunda femoris, SFA endarterectomies + fogarty embolectomy + stenting 11/15/22: 12mm x 58mm Lifestream BILAT CIAs, 14 mm x 6 cm Lifestream & 13 mm x 5 cm Viabahn LEFT EIA   Peripheral neuropathy    Umbilical hernia     Past Surgical History:  Procedure Laterality Date   AMPUTATION Right 11/18/2022   Procedure: AMPUTATION 4TH AND 5TH RAY;  Surgeon: Linus Galas, [name];  Location: [facility] ORS;  Service: Orthopedics/Podiatry;  Laterality: Right;  4th and 5th toe  0.93 0.98  CALCIUM 8.5* 8.3*  MG  --  1.9   Liver Function Tests: Recent Labs    01/15/23 0754 01/16/23 0151  AST 18 22  ALT 12 17  ALKPHOS 59 61  BILITOT 1.2 0.9  PROT 6.5 6.6  ALBUMIN 3.1* 2.9*   No results for input(s): "LIPASE", "AMYLASE" in the last 72 hours. CBC: Recent Labs    01/15/23 0754 01/16/23 0151  WBC 14.0* 13.5*  NEUTROABS 11.7*  --   HGB 11.7* 11.1*  HCT 35.7* 32.9*  MCV 85.0 84.6  PLT 345 292   Cardiac Enzymes: Recent Labs    01/15/23 0754 01/15/23 1011  TROPONINIHS 431* 475*   BNP: Recent Labs    01/15/23 0754  BNP 1,760.5*   D-Dimer: No results for input(s): "DDIMER" in the last 72 hours. Hemoglobin A1C: No results for input(s): "HGBA1C" in the last 72 hours. Fasting Lipid Panel: No results for input(s): "CHOL", "HDL", "LDLCALC", "TRIG", "CHOLHDL", "LDLDIRECT" in the last 72 hours. Thyroid Function Tests: No results for input(s): "TSH", "T4TOTAL", "T3FREE", "THYROIDAB" in the last 72 hours.  Invalid input(s): "FREET3" Anemia Panel: No results for input(s): "VITAMINB12", "FOLATE", "FERRITIN", "TIBC", "IRON", "RETICCTPCT" in the last 72 hours.   Radiology: DG Chest Portable 1 View  Result Date: 01/15/2023 CLINICAL DATA:  Chest pain EXAM: PORTABLE CHEST 1 VIEW COMPARISON:  11/12/2022 FINDINGS: Low volume chest with cardiomegaly and congested appearance of vessels. There is no edema, consolidation, effusion, or pneumothorax. Remote right rib fractures. Bilateral shoulder degeneration with chronic cystic change on both sides of the right glenohumeral joint. IMPRESSION: Low volume chest with cardiomegaly and mild vascular congestion. Electronically Signed   By: Tiburcio Pea M.D.    On: 01/15/2023 07:39   VAS [place] LOWER EXTREMITY VENOUS (DVT)  Result Date: 01/06/2023  Lower Venous DVT Study Patient Name:  Manuel [name]  Date of Exam:   01/01/2023 Medical Rec #: [id number]         Accession #:    [id number] Date of Birth: 1945-12-10         Patient Gender: M Patient Age:   69 years Exam Location:  [facility] Procedure:      VAS [place] LOWER EXTREMITY VENOUS (DVT) Referring Phys: Sheppard Plumber --------------------------------------------------------------------------------  Indications: Pain, Swelling, and Edema.  Risk Factors: Surgery Bilateral femoral endarterectomies 11/15/2022. Performing Technologist: Hardie Lora RVT  Examination Guidelines: A complete evaluation includes B-mode imaging, spectral Doppler, color Doppler, and power Doppler as needed of all accessible portions of each vessel. Bilateral testing is considered an integral part of a complete examination. Limited examinations for reoccurring indications may be performed as noted. The reflux portion of the exam is performed with the patient in reverse Trendelenburg.  +---------+---------------+---------+-----------+----------+--------------+ RIGHT    CompressibilityPhasicitySpontaneityPropertiesThrombus Aging +---------+---------------+---------+-----------+----------+--------------+ CFV      Full           Yes      Yes                                 +---------+---------------+---------+-----------+----------+--------------+ SFJ      Full           Yes      Yes                                 +---------+---------------+---------+-----------+----------+--------------+ FV Prox  Full  [facility] CLINIC CARDIOLOGY CONSULT NOTE       Patient ID: Manuel [name] MRN: [id number] DOB/AGE: 1946-03-24 77 y.o.  Admit date: 01/15/2023 Referring Physician Dr. Doree Albee Primary Physician Dr. Marcelino Duster Primary Cardiologist Dr. Lorelle Gibbs Reason for Consultation AoCHF  HPI: Manuel [name] is a 77 y.o. 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 Cardiology was consulted for further evaluation.   Interval history: -Patient reports feeling better overall this AM.  -Reports SOB has improved, denies any orthopnea overnight. Weaned to RA.  -BP elevated, renal function stable with IV diuresis. Reports good UOP.   Review of systems complete and found to be negative unless listed above    Past Medical History:  Diagnosis Date   Acute ST elevation myocardial infarction (STEMI) of inferior wall (HCC) 04/19/2010   a.) transfered from [facility] to [facility] --> LHC/PCI (very difficult procedure) --> 3.0 x 23 mm and 3.0 x 12 mm Xience stents to RCA   Allergies    Arthritis    Benign essential hypertension    Bilateral carotid artery disease (HCC) 05/08/2021   a.) carotid doppler 05/08/2021: 1-39% BICA   CAD (coronary artery disease) 04/19/2010   a.) inferior STEMI 04/19/2010 --> LHC/PCI: 50-70% pD1, 80% pRI, 90/90/90% RCA (overlapping 3.0 x 23 and 3.0 x 12 mm Xience DES); b.) MV 11/10/2018: fixed minimally reversible inferior perfusion defect   Cellulitis of foot    DDD (degenerative disc disease), cervical    Diabetes mellitus type 2, insulin dependent (HCC)    Diverticulosis    Full dentures    Gout    Hard of hearing    History of bilateral cataract extraction 2022    History of ETOH abuse    Hyperlipidemia    Ischemic cardiomyopathy 04/19/2010   a.) TTE 04/19/2010: 40%; b.) TTE 04/20/2014: EF >55%, mild RVE, triv PR, mild MR/TR, G1DD; c.) TTE 11/10/2018: EF 45%, inf HK, mild RVE, triv TR/PR, mild MR, G1DD; d.) TTE 11/14/2022: EF 25-30%, basal anteroseptal, apical lateral, apical septal, and apex AK, mid anterolateral HK, mild-mod MR/TR   Long term current use of aspirin    Long term current use of clopidogrel    Lumbar degenerative disc disease    Lumbar radiculopathy    Lumbar vertebral fracture (chronic superior endplate of L1)    OSA (obstructive sleep apnea)    a.) unable to tolerate nocturnal PAP therapy   Peripheral artery disease (HCC)    a.) stenting 05/14/21: 12 mm x 12 cm LifeStent RIGHT dis SFA/prox pop; b.) s/p cath directed thrombolysis RIGHT SFA/pop 06/14/21; c.) s/p mech thrombectomy + stenting 06/15/21: 8 mm x 25cm & 8 mm x 7.5cm Viabahn; d.) s/p BILAT CFA, profunda femoris, SFA endarterectomies + fogarty embolectomy + stenting 11/15/22: 12mm x 58mm Lifestream BILAT CIAs, 14 mm x 6 cm Lifestream & 13 mm x 5 cm Viabahn LEFT EIA   Peripheral neuropathy    Umbilical hernia     Past Surgical History:  Procedure Laterality Date   AMPUTATION Right 11/18/2022   Procedure: AMPUTATION 4TH AND 5TH RAY;  Surgeon: Linus Galas, [name];  Location: [facility] ORS;  Service: Orthopedics/Podiatry;  Laterality: Right;  4th and 5th toe  0.93 0.98  CALCIUM 8.5* 8.3*  MG  --  1.9   Liver Function Tests: Recent Labs    01/15/23 0754 01/16/23 0151  AST 18 22  ALT 12 17  ALKPHOS 59 61  BILITOT 1.2 0.9  PROT 6.5 6.6  ALBUMIN 3.1* 2.9*   No results for input(s): "LIPASE", "AMYLASE" in the last 72 hours. CBC: Recent Labs    01/15/23 0754 01/16/23 0151  WBC 14.0* 13.5*  NEUTROABS 11.7*  --   HGB 11.7* 11.1*  HCT 35.7* 32.9*  MCV 85.0 84.6  PLT 345 292   Cardiac Enzymes: Recent Labs    01/15/23 0754 01/15/23 1011  TROPONINIHS 431* 475*   BNP: Recent Labs    01/15/23 0754  BNP 1,760.5*   D-Dimer: No results for input(s): "DDIMER" in the last 72 hours. Hemoglobin A1C: No results for input(s): "HGBA1C" in the last 72 hours. Fasting Lipid Panel: No results for input(s): "CHOL", "HDL", "LDLCALC", "TRIG", "CHOLHDL", "LDLDIRECT" in the last 72 hours. Thyroid Function Tests: No results for input(s): "TSH", "T4TOTAL", "T3FREE", "THYROIDAB" in the last 72 hours.  Invalid input(s): "FREET3" Anemia Panel: No results for input(s): "VITAMINB12", "FOLATE", "FERRITIN", "TIBC", "IRON", "RETICCTPCT" in the last 72 hours.   Radiology: DG Chest Portable 1 View  Result Date: 01/15/2023 CLINICAL DATA:  Chest pain EXAM: PORTABLE CHEST 1 VIEW COMPARISON:  11/12/2022 FINDINGS: Low volume chest with cardiomegaly and congested appearance of vessels. There is no edema, consolidation, effusion, or pneumothorax. Remote right rib fractures. Bilateral shoulder degeneration with chronic cystic change on both sides of the right glenohumeral joint. IMPRESSION: Low volume chest with cardiomegaly and mild vascular congestion. Electronically Signed   By: Tiburcio Pea M.D.    On: 01/15/2023 07:39   VAS [place] LOWER EXTREMITY VENOUS (DVT)  Result Date: 01/06/2023  Lower Venous DVT Study Patient Name:  Manuel [name]  Date of Exam:   01/01/2023 Medical Rec #: [id number]         Accession #:    [id number] Date of Birth: 1945-12-10         Patient Gender: M Patient Age:   69 years Exam Location:  [facility] Procedure:      VAS [place] LOWER EXTREMITY VENOUS (DVT) Referring Phys: Sheppard Plumber --------------------------------------------------------------------------------  Indications: Pain, Swelling, and Edema.  Risk Factors: Surgery Bilateral femoral endarterectomies 11/15/2022. Performing Technologist: Hardie Lora RVT  Examination Guidelines: A complete evaluation includes B-mode imaging, spectral Doppler, color Doppler, and power Doppler as needed of all accessible portions of each vessel. Bilateral testing is considered an integral part of a complete examination. Limited examinations for reoccurring indications may be performed as noted. The reflux portion of the exam is performed with the patient in reverse Trendelenburg.  +---------+---------------+---------+-----------+----------+--------------+ RIGHT    CompressibilityPhasicitySpontaneityPropertiesThrombus Aging +---------+---------------+---------+-----------+----------+--------------+ CFV      Full           Yes      Yes                                 +---------+---------------+---------+-----------+----------+--------------+ SFJ      Full           Yes      Yes                                 +---------+---------------+---------+-----------+----------+--------------+ FV Prox  Full

## 2023-01-16 NOTE — TOC Initial Note (Signed)
Transition of Care [facility]) - Initial/Assessment Note    Patient Details  Name: Manuel [name] MRN: [id number] Date of Birth: [birth year]  [facility]) CM/SW Contact:    Hetty Ely, RN Phone Number: 01/16/2023, 1:20 PM  Clinical Narrative:   CM spoke with patient via phone, alert oriented x4, voices independent at home, still driving to medical appointments. Lives [place] [facility] with wife, uses single point cane or crutches if needed but not consistent. [facility]. Don't think he would need SNF, will discuss if ordered by MD. No TOC needs identified at this time.                 Barriers to Discharge: Continued Medical Work up   Patient Goals and CMS Choice Patient states their goals for this hospitalization and ongoing recovery are:: To return home          Expected Discharge Plan and Services   Discharge Planning Services: Other - See comment (Pending at this time) Post Acute Care Choice: NA (Pending) Living arrangements for the past 2 months: Single Family Home                 DME Arranged: Other see comment (Pending)         HH Arranged: NA HH Agency: NA        Prior Living Arrangements/Services Living arrangements for the past 2 months: Single Family Home Lives with:: Spouse Patient language and need for interpreter reviewed:: Yes Do you feel safe going back to the place where you live?: Yes      Need for Family Participation in Patient Care: No (Comment) Care giver support system in place?: Yes (comment)   Criminal Activity/Legal Involvement Pertinent to Current Situation/Hospitalization: No - Comment as needed  Activities of Daily Living   ADL Screening (condition at time of admission) Does the patient have a NEW difficulty with bathing/dressing/toileting/self-feeding that is expected to last >3 days?: No Does the patient have a NEW difficulty with getting in/out of bed, walking, or climbing stairs that is expected to last >3 days?:  No Does the patient have a NEW difficulty with communication that is expected to last >3 days?: No Is the patient deaf or have difficulty hearing?: No Does the patient have difficulty seeing, even when wearing glasses/contacts?: No  Permission Sought/Granted                  Emotional Assessment Appearance:: Other (Comment Required (telephone assessment) Attitude/Demeanor/Rapport: Engaged Affect (typically observed): Unable to Assess Orientation: : Oriented to Self, Oriented to Place, Oriented to  Time, Oriented to Situation Alcohol / Substance Use: Not Applicable Psych Involvement: No (comment)  Admission diagnosis:  Elevated troponin [R79.89] NSTEMI (non-ST elevated myocardial infarction) (HCC) [I21.4] Acute on chronic systolic congestive heart failure (HCC) [I50.23] Patient Active Problem List   Diagnosis Date Noted   NSTEMI (non-ST elevated myocardial infarction) (HCC) 01/15/2023   Cellulitis of foot 11/26/2022   Toe necrosis (HCC) 11/26/2022   Adjustment disorder with depressed mood 11/14/2022   Acute on chronic combined systolic and diastolic CHF (congestive heart failure) (HCC) 11/12/2022   Ischemic ulcer of lower extremity (HCC) 11/10/2022   Limb ischemia 11/10/2022   Left flank pain 06/16/2021   Constipation 06/16/2021   Foot ulcer, right (HCC) 06/14/2021   Lower limb ischemia right leg with history of angioplasty and stent on 05/14/2021 06/13/2021   Alcohol abuse 06/07/2021   Coronary artery disease with history of myocardial infarction  without history of CABG 05/08/2021   S/P angioplasty with stent 05/08/2021   Atherosclerosis of native arteries of the extremities with ulceration (HCC) 05/08/2021   Diabetic polyneuropathy associated with type 2 diabetes mellitus (HCC) 09/25/2017   Lumbar radiculopathy 01/27/2017   Lumbar degenerative disc disease 01/27/2017   Lumbar spondylosis 01/27/2017   Lumbar facet arthropathy 01/27/2017   Diabetes mellitus type 2, insulin  dependent (HCC) 10/18/2014   Hypertension 10/05/2014   Bilateral carotid artery stenosis 04/05/2014   Microalbuminuria 12/19/2013   Gout 09/21/2013   Mixed hyperlipidemia 09/14/2013   PCP:  Gracelyn Nurse, MD Pharmacy:   [id number] [place], [place] - [address] AT [facility] OF [address] [place] [address] Phone: [phone] Fax: [phone]     Social Determinants of Health (SDOH) Social History: SDOH Screenings   Food Insecurity: No Food Insecurity (01/16/2023)  Housing: Low Risk  (01/16/2023)  Transportation Needs: No Transportation Needs (01/16/2023)  Utilities: Not At Risk (01/16/2023)  Tobacco Use: Low Risk  (01/15/2023)  Recent Concern: Tobacco Use - Medium Risk (12/13/2022)   Received from [facility]   SDOH Interventions:     Readmission Risk Interventions     No data to display

## 2023-01-16 NOTE — Progress Notes (Signed)
PT Cancellation Note  Patient Details Name: Manuel [name] MRN: [id number] DOB: 12-Mar-1946   Cancelled Treatment:     Pt attempt in AM session, pt willing to work later in the day and wanted to at least eat breakfast or lunch before working. Pt is motivated to work with PT. Pt will benefit from continued PT services upon discharge to safely address deficits listed in patient problem list for decreased caregiver assistance and eventual return to PLOF.    Cecile Sheerer, SPT 01/16/23, 9:57 AM

## 2023-01-16 NOTE — Plan of Care (Signed)
  Problem: Education: Goal: Knowledge of General Education information will improve Description: Including pain rating scale, medication(s)/side effects and non-pharmacologic comfort measures Outcome: Progressing   Problem: Clinical Measurements: Goal: Will remain free from infection Outcome: Progressing   Problem: Clinical Measurements: Goal: Respiratory complications will improve Outcome: Progressing   Problem: Clinical Measurements: Goal: Cardiovascular complication will be avoided Outcome: Progressing   Problem: Activity: Goal: Risk for activity intolerance will decrease Outcome: Progressing   Problem: Activity: Goal: Risk for activity intolerance will decrease Outcome: Progressing   Problem: Elimination: Goal: Will not experience complications related to bowel motility Outcome: Progressing   Problem: Pain Managment: Goal: General experience of comfort will improve Outcome: Progressing   Problem: Safety: Goal: Ability to remain free from injury will improve Outcome: Progressing

## 2023-01-16 NOTE — TOC Benefit Eligibility Note (Signed)
Pharmacy Patient Advocate Encounter  Insurance verification completed.    The patient is insured through  [employer]    Ran test claim for [employer]. Currently a quantity of 60 is a 30 day supply and the co-pay is $47.00 .  Ran test claim for [employer]. Currently a quantity of 30 is a 30 day supply and the co-pay is $47.00 for brand. Generic not covered .  Ran test claim for [employer]. Currently a quantity of 30 is a 30 day supply and the co-pay is $47.00 .   This test claim was processed through [facility]- copay amounts may vary at other pharmacies due to pharmacy/plan contracts, or as the patient moves through the different stages of their insurance plan.

## 2023-01-16 NOTE — Progress Notes (Signed)
Occupational Therapy Treatment Patient Details Name: Manuel [name] MRN: [id number] DOB: 18-Sep-1945 Today's Date: 01/16/2023   History of present illness Patient is a 77 y.o. 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  Patient reports worsening shortness of breath, increased work of breathing over the past 4 to 5 days. Current MD assessment:  NSTEMI, Acute on chronic combined systolic and diastolic CHF, Ischemic ulcer of lower extremity, and T2DM.   OT comments  Pt seen for OT treatment on this date. Improved activity tolerance through ADL task. Handoff from PT in hallway, amb with RW + CGA. Pt declined RW use, requiring CGA. Completing standing grooming task ~ oral care for aprox 8 minutes. Increased fatigue noted after grooming, used RW + CGA to amb back to recliner. Communicated with nurse about wound vac ?seal, wound vac appeared to be malfunctioning/sponge inflated. Pt making good progress toward goals, will continue to follow POC. Discharge recommendation remains appropriate.         If plan is discharge home, recommend the following:  A little help with walking and/or transfers;A little help with bathing/dressing/bathroom;Assist for transportation;Help with stairs or ramp for entrance   Equipment Recommendations  None recommended by OT    Recommendations for Other Services      Precautions / Restrictions Precautions Precautions: Fall Restrictions Weight Bearing Restrictions: Yes Other Position/Activity Restrictions: Per secure chat with podiatry, "wedge surgical shoe. Pressure only on the heel when walking"       Mobility Bed Mobility               General bed  mobility comments: Not assessed: Pt walking with PT in hall when handoff to OT    Transfers Overall transfer level: Needs assistance Equipment used: Rolling walker (2 wheels) Transfers: Sit to/from Stand (recliner > standing) Sit to Stand: Supervision                 Balance Overall balance assessment: Needs assistance Sitting-balance support: Feet supported Sitting balance-Leahy Scale: Normal     Standing balance support: During functional activity, No upper extremity supported Standing balance-Leahy Scale: Fair Standing balance comment: Dynamic standing with RW at sink ~                           ADL either performed or assessed with clinical judgement   ADL Overall ADL's : Needs assistance/impaired Eating/Feeding: Set up;Sitting Eating/Feeding Details (indicate cue type and reason): Drink with straw Grooming: Standing;Oral care;Contact guard assist;Wash/dry hands Grooming Details (indicate cue type and reason): RW+ CGA while participating in oral care task (dentures)                             Functional mobility during ADLs: Contact guard assist;Rolling walker (2 wheels) General ADL Comments: Pt amb to sink with RW+CGA to complete standing oral care.    Extremity/Trunk Assessment Upper Extremity Assessment Upper Extremity Assessment: Overall WFL for tasks assessed   Lower Extremity Assessment Lower Extremity Assessment: Generalized weakness;RLE deficits/detail RLE Deficits / Details: Edema throughout RLE        Vision       Perception  Praxis      Cognition Arousal: Alert Behavior During Therapy: WFL for tasks assessed/performed Overall Cognitive Status: Within Functional Limits for tasks assessed                                          Exercises Other Exercises Other Exercises: Edu re: falls prevention, DME use for ADL completion, wound vac    Shoulder Instructions       General Comments Post  session alerted nurse about wound vac abnormal apperance.    Pertinent Vitals/ Pain       Pain Assessment Pain Assessment: No/denies pain Faces Pain Scale: No hurt  Home Living                                          Prior Functioning/Environment              Frequency  Min 1X/week        Progress Toward Goals  OT Goals(current goals can now be found in the care plan section)     Acute Rehab OT Goals OT Goal Formulation: With patient Time For Goal Achievement: 01/29/23 Potential to Achieve Goals: Good  Plan      Co-evaluation                 AM-PAC OT "6 Clicks" Daily Activity     Outcome Measure   Help from another person eating meals?: None Help from another person taking care of personal grooming?: None Help from another person toileting, which includes using toliet, bedpan, or urinal?: A Little Help from another person bathing (including washing, rinsing, drying)?: A Little Help from another person to put on and taking off regular upper body clothing?: None Help from another person to put on and taking off regular lower body clothing?: A Little 6 Click Score: 21    End of Session Equipment Utilized During Treatment: Rolling walker (2 wheels)  OT Visit Diagnosis: Other abnormalities of gait and mobility (R26.89)   Activity Tolerance Patient tolerated treatment well   Patient Left with call bell/phone within reach;with family/visitor present;in chair   Nurse Communication Mobility status and wound vac        Time: 1610-9604 OT Time Calculation (min): 21 min  Charges: OT General Charges $OT Visit: 1 Visit OT Treatments $Self Care/Home Management : 8-22 mins  [employer], OTS

## 2023-01-16 NOTE — Progress Notes (Addendum)
Pt complained of severe ache of 10/10 left upper abdominal pain. . NP Jon Billings made aware. Will continue to monitor.  Update 0027: See new orders. Will continue to monitor.  Update 0114: CCMD called and reported that that pt just had 19 beats of non sustain v-tach. Pt asymptomatic on assessment. VSS. NP Jon Billings made aware. Will continue to monitor.  Update 0120: See new orders.Will continue to monitor.

## 2023-01-17 DIAGNOSIS — I214 Non-ST elevation (NSTEMI) myocardial infarction: Secondary | ICD-10-CM | POA: Diagnosis not present

## 2023-01-17 DIAGNOSIS — Z7189 Other specified counseling: Secondary | ICD-10-CM | POA: Diagnosis not present

## 2023-01-17 LAB — GLUCOSE, CAPILLARY
Glucose-Capillary: 150 mg/dL — ABNORMAL HIGH (ref 70–99)
Glucose-Capillary: 207 mg/dL — ABNORMAL HIGH (ref 70–99)
Glucose-Capillary: 111 mg/dL — ABNORMAL HIGH (ref 70–99)
Glucose-Capillary: 208 mg/dL — ABNORMAL HIGH (ref 70–99)

## 2023-01-17 LAB — CBC
HCT: 30.5 % — ABNORMAL LOW (ref 39.0–52.0)
Hemoglobin: 10.4 g/dL — ABNORMAL LOW (ref 13.0–17.0)
MCH: 27.9 pg (ref 26.0–34.0)
MCHC: 34.1 g/dL (ref 30.0–36.0)
MCV: 81.8 fL (ref 80.0–100.0)
Platelets: 301 10*3/uL (ref 150–400)
RBC: 3.73 MIL/uL — ABNORMAL LOW (ref 4.22–5.81)
RDW: 12.8 % (ref 11.5–15.5)
WBC: 10.7 10*3/uL — ABNORMAL HIGH (ref 4.0–10.5)
nRBC: 0 % (ref 0.0–0.2)

## 2023-01-17 LAB — BASIC METABOLIC PANEL
Anion gap: 11 (ref 5–15)
BUN: 34 mg/dL — ABNORMAL HIGH (ref 8–23)
CO2: 24 mmol/L (ref 22–32)
Calcium: 8.4 mg/dL — ABNORMAL LOW (ref 8.9–10.3)
Chloride: 95 mmol/L — ABNORMAL LOW (ref 98–111)
Creatinine, Ser: 0.85 mg/dL (ref 0.61–1.24)
GFR, Estimated: >60 mL/min (ref >60)
Glucose, Bld: 157 mg/dL — ABNORMAL HIGH (ref 70–99)
Potassium: 3.8 mmol/L (ref 3.5–5.1)
Sodium: 130 mmol/L — ABNORMAL LOW (ref 135–145)

## 2023-01-17 LAB — MAGNESIUM: Magnesium: 2 mg/dL (ref 1.7–2.4)

## 2023-01-17 LAB — PHOSPHORUS: Phosphorus: 3.5 mg/dL (ref 2.5–4.6)

## 2023-01-17 MED ORDER — HYDROMORPHONE HCL 1 MG/ML IJ SOLN
1.0000 mg | INTRAMUSCULAR | Status: DC | PRN
  Administered 2023-01-18 (×5): 1 mg via INTRAVENOUS
  Filled 2023-01-17 (×5): qty 1

## 2023-01-17 MED ORDER — VITAMIN C 500 MG PO TABS
500.0000 mg | ORAL_TABLET | Freq: Every day | ORAL | Status: DC
  Administered 2023-01-17 – 2023-01-19 (×3): 500 mg via ORAL
  Filled 2023-01-17 (×3): qty 1

## 2023-01-17 MED ORDER — AMITRIPTYLINE HCL 25 MG PO TABS
25.0000 mg | ORAL_TABLET | Freq: Every day | ORAL | Status: DC
  Administered 2023-01-17 – 2023-01-18 (×2): 25 mg via ORAL
  Filled 2023-01-17 (×2): qty 1

## 2023-01-17 MED ORDER — DAPAGLIFLOZIN PROPANEDIOL 10 MG PO TABS
10.0000 mg | ORAL_TABLET | Freq: Every day | ORAL | Status: DC
  Administered 2023-01-17 – 2023-01-19 (×3): 10 mg via ORAL
  Filled 2023-01-17 (×4): qty 1

## 2023-01-17 MED ORDER — FUROSEMIDE 10 MG/ML IJ SOLN: 60.0000 mg | Freq: Two times a day (BID) | INTRAMUSCULAR | Status: DC

## 2023-01-17 MED ORDER — POLYSACCHARIDE IRON COMPLEX 150 MG PO CAPS
150.0000 mg | ORAL_CAPSULE | Freq: Every day | ORAL | Status: DC
  Administered 2023-01-17 – 2023-01-19 (×3): 150 mg via ORAL
  Filled 2023-01-17 (×4): qty 1

## 2023-01-17 MED ORDER — TAMSULOSIN HCL 0.4 MG PO CAPS
0.4000 mg | ORAL_CAPSULE | Freq: Every day | ORAL | Status: DC
  Administered 2023-01-17 – 2023-01-18 (×2): 0.4 mg via ORAL
  Filled 2023-01-17 (×2): qty 1

## 2023-01-17 MED ORDER — HYDRALAZINE HCL 50 MG PO TABS: 50.0000 mg | ORAL_TABLET | Freq: Four times a day (QID) | ORAL | Status: DC | PRN

## 2023-01-17 MED ORDER — TORSEMIDE 20 MG PO TABS
20.0000 mg | ORAL_TABLET | Freq: Every day | ORAL | Status: DC
  Administered 2023-01-17 – 2023-01-19 (×3): 20 mg via ORAL
  Filled 2023-01-17 (×3): qty 1

## 2023-01-17 MED ORDER — OXYCODONE HCL 5 MG PO TABS
5.0000 mg | ORAL_TABLET | Freq: Four times a day (QID) | ORAL | Status: DC | PRN
  Administered 2023-01-17 – 2023-01-18 (×3): 5 mg via ORAL
  Filled 2023-01-17 (×3): qty 1

## 2023-01-17 NOTE — Progress Notes (Cosign Needed Addendum)
[facility] CLINIC CARDIOLOGY CONSULT NOTE       Patient ID: Manuel [name] MRN: [id number] DOB/AGE: [birth year] 77 y.o.  Admit date: 01/15/2023 Referring Physician Dr. Doree Albee Primary Physician Dr. Marcelino Duster Primary Cardiologist Dr. Lorelle Gibbs Reason for Consultation AoCHF  HPI: Manuel [name] is a 77 y.o. 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 Cardiology was consulted for further evaluation.   Interval history: -Patient feeling ok this AM, states he had a nosebleed overnight.  -Denies any recurrence of SOB or orthopnea. Leg swelling improved today.  -Reports good UOP with diuresis. Renal function remains stable.  -Expresses frustration and lack of hope he will ever feel better given his chronic conditions.   Review of systems complete and found to be negative unless listed above    Past Medical History:  Diagnosis Date   Acute ST elevation myocardial infarction (STEMI) of inferior wall (HCC) 04/19/2010   a.) transfered from [facility] to [facility] --> LHC/PCI (very difficult procedure) --> 3.0 x 23 mm and 3.0 x 12 mm Xience stents to RCA   Allergies    Arthritis    Benign essential hypertension    Bilateral carotid artery disease (HCC) 05/08/2021   a.) carotid doppler 05/08/2021: 1-39% BICA   CAD (coronary artery disease) 04/19/2010   a.) inferior STEMI 04/19/2010 --> LHC/PCI: 50-70% pD1, 80% pRI, 90/90/90% RCA (overlapping 3.0 x 23 and 3.0 x 12 mm Xience DES); b.) MV 11/10/2018: fixed minimally reversible inferior perfusion defect   Cellulitis of foot    DDD (degenerative disc disease), cervical    Diabetes mellitus type 2, insulin dependent (HCC)     Diverticulosis    Full dentures    Gout    Hard of hearing    History of bilateral cataract extraction 2022   History of ETOH abuse    Hyperlipidemia    Ischemic cardiomyopathy 04/19/2010   a.) TTE 04/19/2010: 40%; b.) TTE 04/20/2014: EF >55%, mild RVE, triv PR, mild MR/TR, G1DD; c.) TTE 11/10/2018: EF 45%, inf HK, mild RVE, triv TR/PR, mild MR, G1DD; d.) TTE 11/14/2022: EF 25-30%, basal anteroseptal, apical lateral, apical septal, and apex AK, mid anterolateral HK, mild-mod MR/TR   Long term current use of aspirin    Long term current use of clopidogrel    Lumbar degenerative disc disease    Lumbar radiculopathy    Lumbar vertebral fracture (chronic superior endplate of L1)    OSA (obstructive sleep apnea)    a.) unable to tolerate nocturnal PAP therapy   Peripheral artery disease (HCC)    a.) stenting 05/14/21: 12 mm x 12 cm LifeStent RIGHT dis SFA/prox pop; b.) s/p cath directed thrombolysis RIGHT SFA/pop 06/14/21; c.) s/p mech thrombectomy + stenting 06/15/21: 8 mm x 25cm & 8 mm x 7.5cm Viabahn; d.) s/p BILAT CFA, profunda femoris, SFA endarterectomies + fogarty embolectomy + stenting 11/15/22: 12mm x 58mm Lifestream BILAT CIAs, 14 mm x 6 cm Lifestream & 13 mm x 5 cm Viabahn LEFT EIA   Peripheral neuropathy    Umbilical hernia     Past Surgical History:  Procedure Laterality Date   AMPUTATION Right 11/18/2022   Procedure: AMPUTATION 4TH AND 5TH RAY;  Surgeon:  Linus Galas, [name];  Location: [facility] ORS;  Service: Orthopedics/Podiatry;  Laterality: Right;  4th and 5th toe   APPLICATION OF WOUND VAC Right 01/09/2023   Procedure: APPLICATION OF WOUND VAC;  Surgeon: Annice Needy, MD;  Location: [facility] OR[facility];  Service: Vascular;  Laterality: Right;   CATARACT EXTRACTION W/PHACO Right 03/14/2021   Procedure: CATARACT EXTRACTION PHACO AND INTRAOCULAR LENS PLACEMENT (IOC) RIGHT DIABETIC;  Surgeon: Lockie Mola, MD;  Location: [facility];  Service: Ophthalmology;  Laterality: Right;   Diabetic 16.78 01:39.9   CATARACT EXTRACTION W/PHACO Left 03/28/2021   Procedure: CATARACT EXTRACTION PHACO AND INTRAOCULAR LENS PLACEMENT (IOC) LEFT DIABETIC 6.93 01:22.0;  Surgeon: Lockie Mola, MD;  Location: [facility];  Service: Ophthalmology;  Laterality: Left;  Diabetic   COLONOSCOPY     CORONARY ANGIOPLASTY WITH STENT PLACEMENT  03/2010   Procedure: CORONARY ANGIOPLASTY WITH STENT PLACEMENT; Location: [name]   ENDARTERECTOMY FEMORAL Bilateral 11/15/2022   Procedure: BILATERAL COMMON FEMORAL PROFUNDA FEMORIS AND SUPERFICIAL FEMORAL ARTERY ENDARTECTOMIES, RIGHT FOGARTY EMBOLECTOMY OF THE RIGHT SFA  AND  POPLITEAL ARTERIES. AORTAGRAM AND RIGHT LOWER EXTREMITY ANGIOGRAM.;  Surgeon: Annice Needy, MD;  Location: [facility] OR[facility];  Service: Vascular;  Laterality: Bilateral;   INSERTION OF ILIAC STENT Bilateral 11/15/2022   Procedure: BILATERAL STENT INSERTION IN BILATERAL  COMMON ILIAC ARTERY, STENT INSERTION OF LEFT EXTERNAL ILIAC ARTERY. ANGIOPLASTY RIGHT TIBIAL  AND POPLITEAL ARTERY.;  Surgeon: Annice Needy, MD;  Location: [facility] OR[facility];  Service: Vascular;  Laterality: Bilateral;   LOWER EXTREMITY ANGIOGRAPHY Right 05/14/2021   Procedure: LOWER EXTREMITY ANGIOGRAPHY;  Surgeon: Annice Needy, MD;  Location: [facility];  Service: Cardiovascular;  Laterality: Right;   LOWER EXTREMITY ANGIOGRAPHY Right 06/14/2021   Procedure: Lower Extremity Angiography;  Surgeon: Annice Needy, MD;  Location: [facility];  Service: Cardiovascular;  Laterality: Right;   LOWER EXTREMITY ANGIOGRAPHY Right 11/11/2022   Procedure: Lower Extremity Angiography;  Surgeon: Annice Needy, MD;  Location: [facility];  Service: Cardiovascular;  Laterality: Right;   LOWER EXTREMITY INTERVENTION Right 06/15/2021   Procedure: LOWER EXTREMITY INTERVENTION;  Surgeon: Annice Needy, MD;  Location: [facility];  Service: Cardiovascular;  Laterality: Right;   TONSILLECTOMY     WOUND DEBRIDEMENT  Right 01/09/2023   Procedure: DEBRIDEMENT WOUND;  Surgeon: Annice Needy, MD;  Location: [facility];  Service: Vascular;  Laterality: Right;    Medications Prior to Admission  Medication Sig Dispense Refill Last Dose   aspirin EC 81 MG tablet Take 1 tablet (81 mg total) by mouth daily. 150 tablet 2 01/14/2023   atorvastatin (LIPITOR) 80 MG tablet Take 1 tablet (80 mg total) by mouth daily. (Patient taking differently: Take 80 mg by mouth at bedtime.) 90 tablet 1 01/14/2023   carvedilol (COREG) 25 MG tablet Take 25 mg by mouth 2 (two) times daily with a meal.   01/14/2023   clopidogrel (PLAVIX) 75 MG tablet Take 1 tablet (75 mg total) by mouth daily. 90 tablet 1 01/14/2023   fluticasone (FLONASE) 50 MCG/ACT nasal spray Place 1 spray into both nostrils daily as needed for allergies or rhinitis.   Past Week   gabapentin (NEURONTIN) 400 MG capsule Take 800 mg by mouth 2 (two) times daily.   01/14/2023   insulin aspart protamine- aspart (NOVOLOG MIX 70/30) (70-30) 100 UNIT/ML injection Inject 30 Units into the skin 2 (two) times daily with a meal.   01/14/2023   levocetirizine (XYZAL) 5 MG tablet  Take 5 mg by mouth every evening.   Past Week   senna-docusate (SENOKOT-S) 8.6-50 MG tablet Take 1 tablet by mouth at bedtime as needed for mild constipation. 30 tablet 0 Past Week   spironolactone (ALDACTONE) 50 MG tablet Take 1 tablet (50 mg total) by mouth daily. 30 tablet 0 01/14/2023   Ensure Max Protein (ENSURE MAX PROTEIN) LIQD Take 330 mLs (11 oz total) by mouth 2 (two) times daily. (Patient taking differently: Take 11 oz by mouth daily.) 10000 mL 3    nystatin (MYCOSTATIN/NYSTOP) powder Apply 1 Application topically 3 (three) times daily. (Patient not taking: Reported on 01/09/2023) 30 g 5    Social History   Socioeconomic History   Marital status: Married    Spouse name: [name]   Number of children: 3   Years of education: Not on file   Highest education level: Not on file  Occupational History   Not on  file  Tobacco Use   Smoking status: Never   Smokeless tobacco: Never  Vaping Use   Vaping status: Never Used  Substance and Sexual Activity   Alcohol use: Yes    Alcohol/week: 21.0 standard drinks of alcohol    Types: 7 Glasses of wine, 14 Cans of beer per week    Comment: occassional   Drug use: No   Sexual activity: Not on file  Other Topics Concern   Not on file  Social History Narrative   Not on file   Social Determinants of Health   Financial Resource Strain: Not on file  Food Insecurity: No Food Insecurity (01/16/2023)   Hunger Vital Sign    Worried About Running Out of Food in the Last Year: Never true    Ran Out of Food in the Last Year: Never true  Transportation Needs: No Transportation Needs (01/16/2023)   PRAPARE - Administrator, Civil Service (Medical): No    Lack of Transportation (Non-Medical): No  Physical Activity: Not on file  Stress: Not on file  Social Connections: Not on file  Intimate Partner Violence: Not At Risk (01/16/2023)   Humiliation, Afraid, Rape, and Kick questionnaire    Fear of Current or Ex-Partner: No    Emotionally Abused: No    Physically Abused: No    Sexually Abused: No    Family History  Problem Relation Age of Onset   Scoliosis Mother    Heart disease Father      Vitals:   01/16/23 1930 01/16/23 2339 01/17/23 0828 01/17/23 1125  BP: 137/68 (!) 156/76 (!) 148/80 136/63  Pulse: 74 76 71 65  Resp: 20 18 20 20   Temp: 98.8 F (37.1 C) (!) 97.5 F (36.4 C) 97.8 F (36.6 C) 98 F (36.7 C)  TempSrc: Oral  Oral   SpO2: 94% 98% 99% 95%  Weight:      Height:        PHYSICAL EXAM General: Chronically ill appearing male, well nourished, in no acute distress sitting upright on side of hospital bed. HEENT: Normocephalic and atraumatic. Neck: No JVD.  Lungs: Normal respiratory effort on RA. Diminished breath sounds bilterally. Heart: HRRR. Normal S1 and S2 without gallops or murmurs.  Abdomen: Non-distended  appearing.  Msk: Normal strength and tone for age. Extremities: Warm and well perfused. No clubbing, cyanosis. 2+ pitting edema bilaterally.  Neuro: Alert and oriented X 3. Psych: Answers questions appropriately.   Labs: Basic Metabolic Panel: Recent Labs    01/16/23 0151 01/17/23 0451  NA 128* 130*  K 4.2 3.8  CL 94* 95*  CO2 24 24  GLUCOSE 176* 157*  BUN 29* 34*  CREATININE 0.98 0.85  CALCIUM 8.3* 8.4*  MG 1.9 2.0  PHOS  --  3.5   Liver Function Tests: Recent Labs    01/15/23 0754 01/16/23 0151  AST 18 22  ALT 12 17  ALKPHOS 59 61  BILITOT 1.2 0.9  PROT 6.5 6.6  ALBUMIN 3.1* 2.9*   No results for input(s): "LIPASE", "AMYLASE" in the last 72 hours. CBC: Recent Labs    01/15/23 0754 01/16/23 0151 01/17/23 0451  WBC 14.0* 13.5* 10.7*  NEUTROABS 11.7*  --   --   HGB 11.7* 11.1* 10.4*  HCT 35.7* 32.9* 30.5*  MCV 85.0 84.6 81.8  PLT 345 292 301   Cardiac Enzymes: Recent Labs    01/15/23 0754 01/15/23 1011  TROPONINIHS 431* 475*   BNP: Recent Labs    01/15/23 0754  BNP 1,760.5*   D-Dimer: No results for input(s): "DDIMER" in the last 72 hours. Hemoglobin A1C: No results for input(s): "HGBA1C" in the last 72 hours. Fasting Lipid Panel: No results for input(s): "CHOL", "HDL", "LDLCALC", "TRIG", "CHOLHDL", "LDLDIRECT" in the last 72 hours. Thyroid Function Tests: No results for input(s): "TSH", "T4TOTAL", "T3FREE", "THYROIDAB" in the last 72 hours.  Invalid input(s): "FREET3" Anemia Panel: Recent Labs    01/16/23 0151 01/16/23 0748  VITAMINB12  --  428  FOLATE 16.1  --   TIBC 245*  --   IRON 17*  --      Radiology: DG Chest Portable 1 View  Result Date: 01/15/2023 CLINICAL DATA:  Chest pain EXAM: PORTABLE CHEST 1 VIEW COMPARISON:  11/12/2022 FINDINGS: Low volume chest with cardiomegaly and congested appearance of vessels. There is no edema, consolidation, effusion, or pneumothorax. Remote right rib fractures. Bilateral shoulder degeneration  with chronic cystic change on both sides of the right glenohumeral joint. IMPRESSION: Low volume chest with cardiomegaly and mild vascular congestion. Electronically Signed   By: Tiburcio Pea M.D.   On: 01/15/2023 07:39   VAS [place] LOWER EXTREMITY VENOUS (DVT)  Result Date: 01/06/2023  Lower Venous DVT Study Patient Name:  Manuel [name]  Date of Exam:   01/01/2023 Medical Rec #: [id number]         Accession #:    [id number] Date of Birth: 01/29/1946         Patient Gender: M Patient Age:   89 years Exam Location:  [facility] Procedure:      VAS [place] LOWER EXTREMITY VENOUS (DVT) Referring Phys: Sheppard Plumber --------------------------------------------------------------------------------  Indications: Pain, Swelling, and Edema.  Risk Factors: Surgery Bilateral femoral endarterectomies 11/15/2022. Performing Technologist: Hardie Lora RVT  Examination Guidelines: A complete evaluation includes B-mode imaging, spectral Doppler, color Doppler, and power Doppler as needed of all accessible portions of each vessel. Bilateral testing is considered an integral part of a complete examination. Limited examinations for reoccurring indications may be performed as noted. The reflux portion of the exam is performed with the patient in reverse Trendelenburg.  +---------+---------------+---------+-----------+----------+--------------+ RIGHT    CompressibilityPhasicitySpontaneityPropertiesThrombus Aging +---------+---------------+---------+-----------+----------+--------------+ CFV      Full           Yes      Yes                                 +---------+---------------+---------+-----------+----------+--------------+ SFJ      Full  Yes      Yes                                 +---------+---------------+---------+-----------+----------+--------------+ FV Prox  Full           Yes      Yes                                  +---------+---------------+---------+-----------+----------+--------------+ FV Mid   Full           Yes      Yes                                 +---------+---------------+---------+-----------+----------+--------------+ FV DistalFull           Yes      Yes                                 +---------+---------------+---------+-----------+----------+--------------+ PFV      Full           Yes      Yes                                 +---------+---------------+---------+-----------+----------+--------------+ POP      Full           Yes      Yes                                 +---------+---------------+---------+-----------+----------+--------------+ PTV      Full                                                        +---------+---------------+---------+-----------+----------+--------------+ GSV      Full                                                        +---------+---------------+---------+-----------+----------+--------------+ SSV      Full                                                        +---------+---------------+---------+-----------+----------+--------------+ Irregular anechoic area adjacent to CFV consistent with hematoma. SFA stent is patent.  +----+---------------+---------+-----------+----------+--------------+ LEFTCompressibilityPhasicitySpontaneityPropertiesThrombus Aging +----+---------------+---------+-----------+----------+--------------+ CFV Full           Yes      Yes                                 +----+---------------+---------+-----------+----------+--------------+ Irregular anechoic area adjacent to CFV consistent with hematoma.    Summary: RIGHT: - There is no evidence of deep vein thrombosis in the  lower extremity. - There is no evidence of superficial venous thrombosis.  - No cystic structure found in the popliteal fossa. - Hematoma seen in right groin.  LEFT: - No evidence of common femoral vein obstruction.  - Hematoma  seen in left groin.  *See table(s) above for measurements and observations. Electronically signed by Levora Dredge MD on 01/06/2023 at 12:24:09 PM.    Final    VAS [place] ABI WITH/WO TBI  Result Date: 01/02/2023  LOWER EXTREMITY DOPPLER STUDY Patient Name:  Manuel [name]  Date of Exam:   01/01/2023 Medical Rec #: [id number]         Accession #:    [id number] Date of Birth: Jul 09, 1945         Patient Gender: M Patient Age:   84 years Exam Location:  [facility] Procedure:      VAS [place] ABI WITH/WO TBI Referring Phys: Barbara Cower DEW --------------------------------------------------------------------------------  Indications: Peripheral artery disease. High Risk Factors: Hypertension, hyperlipidemia, Diabetes, no history of                    smoking. Other Factors: Right leg swelling since the procedure.  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 PTA Left CIA. PTA Right Peroneal                         Artery Tibioperoneal Trunk. PTA of the Right Distal SFA.                         Stent placement to the Right SFA Distal segment and                         Proximal Popliteal Artery. Performing Technologist: Hardie Lora RVT  Examination Guidelines: A complete evaluation includes at minimum, Doppler waveform signals and systolic blood pressure reading at the level of bilateral brachial, anterior tibial, and posterior tibial arteries, when vessel segments are accessible. Bilateral testing is considered an integral part of a complete examination. Photoelectric Plethysmograph (PPG) waveforms and toe systolic pressure readings are included as required and additional duplex testing as needed. Limited examinations for reoccurring indications may be performed  as noted.  ABI Findings: +---------+------------------+-----+----------+--------+ Right    Rt Pressure (mmHg)IndexWaveform  Comment  +---------+------------------+-----+----------+--------+ Brachial 163                                       +---------+------------------+-----+----------+--------+ PTA      110               0.67 monophasic         +---------+------------------+-----+----------+--------+ DP  89                0.54 monophasic         +---------+------------------+-----+----------+--------+ Great Toe55                0.33                    +---------+------------------+-----+----------+--------+ +---------+------------------+-----+----------+-------+ Left     Lt Pressure (mmHg)IndexWaveform  Comment +---------+------------------+-----+----------+-------+ Brachial 165                                      +---------+------------------+-----+----------+-------+ PTA      136               0.82 monophasic        +---------+------------------+-----+----------+-------+ DP       131               0.79 biphasic          +---------+------------------+-----+----------+-------+ Penelope Coop                0.54                   +---------+------------------+-----+----------+-------+ +-------+-----------+-----------+------------+------------+ ABI/TBIToday's ABIToday's TBIPrevious ABIPrevious TBI +-------+-----------+-----------+------------+------------+ Right  0.67       0.33       0.81        0.64         +-------+-----------+-----------+------------+------------+ Left   0.82       0.54       0.90        0.72         +-------+-----------+-----------+------------+------------+ Right ABIs appear decreased compared to prior study on 06/07/2022. Left ABIs appear essentially unchanged compared to prior study on 06/07/2022. Limited imaging of the right leg showed compressible femoral vein and popliteal vein.  Summary: Right: Resting right  ankle-brachial index indicates moderate right lower extremity arterial disease. The right toe-brachial index is abnormal. Left: Resting left ankle-brachial index indicates mild left lower extremity arterial disease. The left toe-brachial index is abnormal. *See table(s) above for measurements and observations.  Electronically signed by Festus Barren MD on 01/02/2023 at 2:39:54 PM.    Final     ECHO 11/14/2022:  1. Left ventricular ejection fraction, by estimation, is 25 to 30%. The left ventricle has severely decreased function. The left ventricle demonstrates regional wall motion abnormalities (see scoring diagram/findings for description). Left ventricular diastolic parameters were normal.   2. Right ventricular systolic function is normal. The right ventricular size is normal.   3. The mitral valve is normal in structure. Mild to moderate mitral valve regurgitation. No evidence of mitral stenosis.   4. Tricuspid valve regurgitation is mild to moderate.   5. The aortic valve is normal in structure. Aortic valve regurgitation is  not visualized. No aortic stenosis is present.   6. The inferior vena cava is normal in size with greater than 50% respiratory variability, suggesting right atrial pressure of 3 mmHg.   TELEMETRY reviewed by me 01/17/2023: sinus rhythm rate 70s  EKG reviewed by me: sinus rhythm rate 94 bpm  Data reviewed by me 01/17/2023: last 24h vitals tele labs imaging I/O ED provider note, admission H&P  Principal Problem:   NSTEMI (non-ST elevated myocardial infarction) [facility]) Active Problems:   Hypertension   Diabetes mellitus type 2, insulin dependent (HCC)   Diabetic polyneuropathy associated with type 2 diabetes  mellitus (HCC)   Mixed hyperlipidemia   Alcohol abuse   Ischemic ulcer of lower extremity (HCC)    ASSESSMENT AND PLAN:  Manuel [name] is a 77 y.o. 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 Cardiology was consulted for further evaluation.   # Acute on chronic HFrEF # Orthopnea Patient presented to the ED with complaints of orthopnea x 1 night and worsening leg swelling for the last few weeks.  Chest x-ray in the ED with mild vascular congestion, BNP elevated to 1700.  Placed on supplemental oxygen at 2 L [place]. Now without any SOB or orthopnea and weaned to RA.  -S/p IV lasix. Will switch to po torsemide today.  -Start farxiga 10 mg daily. Continue spironolactone, carvedilol 25 mg bid, and losartan 25 mg daily (was taking bid at home).  -Monitor and replenish electrolytes for a goal K >4, Mag >2   -Heart failure clinic appointment scheduled for 10/1 at 3 PM at [facility].   # Demand ischemia # Coronary artery disease s/p DES to mid RCA 2011 Troponins elevated on admission 431 > 475. EKG nonacute. Patient is without chest pain.  Troponin elevation most consistent with demand/supply mismatch and not ACS in the setting of acute heart failure exacerbation.  -Monitor for changes in symptoms. -No plan for further cardiac diagnostics. -Continue home statin, asa, plavix.  # Severe PVD with LE ulceration # Diabetic neuropathy S/p multiple interventions most recently with debridement of R groin wound by vascular surgery on 9/19. Wound vac in place.  -Management per primary and vascular -Continue home statin, asa, plavix.    This patient's plan of care was discussed and created with Dr. Juliann Pares and he is in agreement.  Signed: Gale Journey, PA-C  01/17/2023, 11:31 AM [facility]

## 2023-01-17 NOTE — Consult Note (Addendum)
WOC consult requested for wound Vac dressing change to right groin post-op full thickness wound.  Pt was admitted from home and vascular team has been following as an outpatient.  Discussed plan of care vias Secure chat with their team and they plan to perform the Vac change today and assess the wound, then WOC team will perform the dressing change on Mon. Thank-you,  Cammie Mcgee MSN, RN, CWOCN, [place], CNS [phone]

## 2023-01-17 NOTE — Progress Notes (Signed)
[facility] Progress Note  Patient: Manuel [name]    GLO:[id number]  DOA: 01/15/2023     Date of Service: the patient was seen and examined on 01/17/2023  Chief Complaint  Patient presents with   Abdominal Pain   Brief hospital course: ASHTAN GIRTMAN is a 77 y.o. male with medical history significant of HFrEF, insulin-dependent diabetes, hypertension, CAD, PAD status post angioplasty with stent 2023 as well as recent admission for lower limb ischemia with multiple vascular procedures July through August 2024 presenting with acute on chronic HFrEF.  Patient reports worsening shortness of breath, increased work of breathing over the past 4 to 5 days.  Does also report neuropathic pain with symptoms being poorly controlled with Neurontin.  Positive orthopnea, PND.  Does report?  Partial compliance with diuretic regimen.  No reported high salt or NSAID intake.  Prior alcohol use.  No reported active alcohol use at present.  No abdominal pain.  No diar white count 14, hemoglobin 11.7, platelets 345, troponin 430s to 470s.  Creatinine 0.93.  Glucose 180s.  BNP is 1760.  Chest x-ray positive cardiomegaly.  Rhea.  Recently had I&D done with vascular surgery September 19.  Wound VAC has been stable. Presented to the ER afebrile, hemodynamically stable.  Satting well on room air.   Assessment and Plan:  NSTEMI (non-ST elevated myocardial infarction)  Troponin into the 400s in the setting of active decompensated heart failure EKG stable. No active chest pain. Suspect secondary demand ischemia in setting of heart failure exacerbation. S/p heparin drip, DC'd on 9/26 as per cardiology no need Continued aspirin and Plavix Cardiology consulted Follow-up recommendations   Acute on chronic combined systolic and diastolic CHF (congestive heart failure)  2D echo July 2024 with EF of 20 to 25% Acute decompensated HFrEF with increased work of breathing, orthopnea, PND as well as lower extremity swelling  over the past few days BNP 1700 with noted cardiomegaly on chest x-ray S/p IV Lasix in the ER Continue with diuresis as well as heart failure protocol Cardiology consulted, started Farxiga 10 mg p.o. daily, IV Lasix switched to torsemide oral.  Continue spironolactone 50 mg p.o. daily, Coreg 25 mg p.o. twice daily and losartan 20 mg p.o. 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 Recent wound debridement by vascular surgery September 19 Wound VAC in place, it was leaking air so it was fixed by and dressing appears to be stable-CDI  S/p Hparin drip, discontinued as per cardiology Continue statin Vascular surgery consulted Wound care RN consulted back management     Diabetic polyneuropathy associated with type 2 diabetes mellitus Patient with worsening pain despite oral Neurontin regimen Consider discussion with vascular surgery to better assess pain regimen in the setting of PAD Started oxycodone 5 mg p.o. every 6 hourly prn and Dilaudid IV as needed for breakthrough pain 9/27 started amitriptyline 25 mg p.o. nightly for neuropathy as well as for depression Follow-up   Hypertension BP stable  Titrate home regimen      Diabetes mellitus type 2, insulin dependent (HCC) SSI  A1C  Alcohol abuse Noted prior hx/o ETOH use  No reported active ETOH use at present  Monitor      Mixed hyperlipidemia Statin  Iron deficiency anemia Started oral iron supplement with vitamin C Follow-up with PCP to repeat iron profile after 3 to 6  months Folic acid and B12 levels within normal range Generalized weakness, vitamin D within normal range  Difficulty urinating, possible BPH Started Flomax 0.4 mg p.o. nightly  Body mass index is 30.17 kg/m.  Interventions:  Diet: Carb modified diet DVT Prophylaxis: Subcutaneous Lovenox   Advance goals of care discussion: DNR  Family Communication: family was present at bedside, at the time of interview.  The pt provided permission to discuss medical plan with the family. Opportunity was given to ask question and all questions were answered satisfactorily.   Disposition:  Pt is from Home, admitted with CHF, still on Iv lasix, which precludes a safe discharge. Discharge to Home, when stable and cleared by cardiology.  Subjective: No significant overnight events, patient was sleeping comfortably without any respiratory distress.  Patient's wife was at bedside, she stated that he just left now and does not want me to wake him up.  Management plan discussed with patient's wife.    Physical Exam: General: NAD, lying comfortably Appear in no distress,  Eyes: Closed, patient was sleeping ENT: Patient was sleeping, mouth was closed Neck: no JVD,  Cardiovascular: S1 and S2 Present, no Murmur,  Respiratory: good respiratory effort, Bilateral Air entry equal and Decreased, no Crackles, no wheezes Abdomen: Bowel Sound present, Soft and no tenderness, Left groin wound VAC attached Skin: no rashes Extremities: 2-3+ pedal edema, no calf tenderness Neurologic: without any new focal findings Gait not checked due to patient safety concerns  Vitals:   01/16/23 1930 01/16/23 2339 01/17/23 0828 01/17/23 1125  BP: 137/68 (!) 156/76 (!) 148/80 136/63  Pulse: 74 76 71 65  Resp: 20 18 20 20   Temp: 98.8 F (37.1 C) (!) 97.5 F (36.4 C) 97.8 F (36.6 C) 98 F (36.7 C)  TempSrc: Oral  Oral   SpO2: 94% 98% 99% 95%  Weight:      Height:        Intake/Output Summary (Last 24 hours) at  01/17/2023 1327 Last data filed at 01/17/2023 1037 Gross per 24 hour  Intake 480 ml  Output 400 ml  Net 80 ml   Filed Weights   01/15/23 0724 01/16/23 0011 01/16/23 0500  Weight: 84.4 kg 90 kg 90 kg    Data Reviewed: I have personally reviewed and interpreted daily labs, tele strips, imagings as discussed above. I reviewed all nursing notes, pharmacy notes, vitals, pertinent old records I have discussed plan of care as described above with RN and patient/family.  CBC: Recent Labs  Lab 01/15/23 0754 01/16/23 0151 01/17/23 0451  WBC 14.0* 13.5* 10.7*  NEUTROABS 11.7*  --   --   HGB 11.7* 11.1* 10.4*  HCT 35.7* 32.9* 30.5*  MCV 85.0 84.6 81.8  PLT 345 292 301   Basic Metabolic Panel: Recent Labs  Lab 01/15/23 0754 01/16/23 0151 01/17/23 0451  NA 129* 128* 130*  K 4.5 4.2 3.8  CL 97* 94* 95*  CO2 23 24 24   GLUCOSE 187* 176* 157*  BUN 23 29* 34*  CREATININE 0.93 0.98 0.85  CALCIUM 8.5* 8.3* 8.4*  MG  --  1.9 2.0  PHOS  --   --  3.5    Studies: No results found.  Scheduled Meds:  ascorbic  acid  500 mg Oral Daily   aspirin EC  81 mg Oral Daily   atorvastatin  80 mg Oral QHS   carvedilol  25 mg Oral BID WC   clopidogrel  75 mg Oral Daily   dapagliflozin propanediol  10 mg Oral Daily   enoxaparin (LOVENOX) injection  0.5 mg/kg Subcutaneous Q24H   gabapentin  800 mg Oral BID   insulin aspart  0-5 Units Subcutaneous QHS   insulin aspart  0-9 Units Subcutaneous TID WC   insulin aspart  3 Units Subcutaneous TID WC   iron polysaccharides  150 mg Oral Daily   losartan  25 mg Oral Daily   spironolactone  50 mg Oral Daily   torsemide  20 mg Oral Daily   Continuous Infusions:   PRN Meds: alum & mag hydroxide-simeth, hydrALAZINE, HYDROmorphone (DILAUDID) injection, ondansetron **OR** ondansetron (ZOFRAN) IV, mouth rinse, oxyCODONE  Time spent: 35 minutes  Author: Gillis Santa. MD [facility] 01/17/2023 1:27 PM  To reach On-call, see care teams to locate  the attending and reach out to them via [email or web address]. If 7PM-7AM, please contact night-coverage If you still have difficulty reaching the attending provider, please page the [facility] (Director on Call) for [facility] on amion for assistance.

## 2023-01-17 NOTE — Consult Note (Signed)
Consultation Note Date: 01/17/2023   Patient Name: Manuel [name]  DOB: Aug 05, 1945  MRN: [id number]  Age / Sex: 77 y.o., male  PCP: Gracelyn Nurse, MD Referring Physician: Gillis Santa, MD  Reason for Consultation: Establishing goals of care  HPI/Patient Profile: Manuel [name] is a 77 y.o. 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 Cardiology was consulted for further evaluation.   Clinical Assessment and Goals of Care: Notes and labs reviewed.  In to see patient.  His wife is sitting at bedside.  He states they have been married for 41 years.  He has 3 children.  He is a retired Geophysicist/field seismologist.  He has been retired for around 16 years.  Patient was incredibly thorough in telling me all events during the past few months with his health care.  He states he is aware that in the future he may require lower extremity amputation. Patient confirms DNR status.  He discusses the current admission and the need for diuretics.  While I was present at bedside patient needed to urinate.  Upon returning to the room patient states that he is having a lot of difficulty with being able to urinate, and has dribbling and interrupted flow issues.  He denies any known diagnosis including BPH.  He states his penis is retracted such that he is not really able to see it.  He states this is new as of the past few weeks, and it has not improved.  Primary team notified via epic chat.     SUMMARY OF RECOMMENDATIONS   Patient complaining of difficulty with urinating.  Would recommend a urology  consult prior to discharge with addition of Flomax.  PMT will follow.        Primary Diagnoses: Present on Admission:  NSTEMI (non-ST elevated myocardial infarction) (HCC)  Ischemic ulcer of lower extremity (HCC)  Hypertension  Mixed hyperlipidemia  Alcohol abuse  Diabetic polyneuropathy associated with type 2 diabetes mellitus (HCC)   I have reviewed the medical record, interviewed the patient and family, and examined the patient. The following aspects are pertinent.  Past Medical History:  Diagnosis Date   Acute ST elevation myocardial infarction (STEMI) of inferior wall (HCC) 04/19/2010   a.) transfered [facility] to [facility] --> LHC/PCI (very difficult procedure) --> 3.0 x 23 mm and 3.0 x 12 mm Xience stents to RCA   Allergies    Arthritis    Benign essential hypertension    Bilateral carotid artery disease (HCC) 05/08/2021   a.) carotid doppler 05/08/2021: 1-39% BICA   CAD (coronary artery disease) 04/19/2010   a.) inferior STEMI 04/19/2010 --> LHC/PCI: 50-70% pD1, 80% pRI, 90/90/90% RCA (overlapping 3.0 x 23 and 3.0  x 12 mm Xience DES); b.) MV 11/10/2018: fixed minimally reversible inferior perfusion defect   Cellulitis of foot    DDD (degenerative disc disease), cervical    Diabetes mellitus type 2, insulin dependent (HCC)    Diverticulosis    Full dentures    Gout    Hard of hearing    History of bilateral cataract extraction 2022   History of ETOH abuse    Hyperlipidemia    Ischemic cardiomyopathy 04/19/2010   a.) TTE 04/19/2010: 40%; b.) TTE 04/20/2014: EF >55%, mild RVE, triv PR, mild MR/TR, G1DD; c.) TTE 11/10/2018: EF 45%, inf HK, mild RVE, triv TR/PR, mild MR, G1DD; d.) TTE 11/14/2022: EF 25-30%, basal anteroseptal, apical lateral, apical septal, and apex AK, mid anterolateral HK, mild-mod MR/TR   Long term current use of aspirin    Long term current use of clopidogrel    Lumbar degenerative disc disease    Lumbar radiculopathy    Lumbar vertebral fracture  (chronic superior endplate of L1)    OSA (obstructive sleep apnea)    a.) unable to tolerate nocturnal PAP therapy   Peripheral artery disease (HCC)    a.) stenting 05/14/21: 12 mm x 12 cm LifeStent RIGHT dis SFA/prox pop; b.) s/p cath directed thrombolysis RIGHT SFA/pop 06/14/21; c.) s/p mech thrombectomy + stenting 06/15/21: 8 mm x 25cm & 8 mm x 7.5cm Viabahn; d.) s/p BILAT CFA, profunda femoris, SFA endarterectomies + fogarty embolectomy + stenting 11/15/22: 12mm x 58mm Lifestream BILAT CIAs, 14 mm x 6 cm Lifestream & 13 mm x 5 cm Viabahn LEFT EIA   Peripheral neuropathy    Umbilical hernia    Social History   Socioeconomic History   Marital status: Married    Spouse name: [name]   Number of children: 3   Years of education: Not on file   Highest education level: Not on file  Occupational History   Not on file  Tobacco Use   Smoking status: Never   Smokeless tobacco: Never  Vaping Use   Vaping status: Never Used  Substance and Sexual Activity   Alcohol use: Yes    Alcohol/week: 21.0 standard drinks of alcohol    Types: 7 Glasses of wine, 14 Cans of beer per week    Comment: occassional   Drug use: No   Sexual activity: Not on file  Other Topics Concern   Not on file  Social History Narrative   Not on file   Social Determinants of Health   Financial Resource Strain: Not on file  Food Insecurity: No Food Insecurity (01/16/2023)   Hunger Vital Sign    Worried About Running Out of Food in the Last Year: Never true    Ran Out of Food in the Last Year: Never true  Transportation Needs: No Transportation Needs (01/16/2023)   PRAPARE - Administrator, Civil Service (Medical): No    Lack of Transportation (Non-Medical): No  Physical Activity: Not on file  Stress: Not on file  Social Connections: Not on file   Family History  Problem Relation Age of Onset   Scoliosis Mother    Heart disease Father    Scheduled Meds:  ascorbic acid  500 mg Oral Daily   aspirin EC   81 mg Oral Daily   atorvastatin  80 mg Oral QHS   carvedilol  25 mg Oral BID WC   clopidogrel  75 mg Oral Daily   dapagliflozin propanediol  10 mg Oral Daily   enoxaparin (  LOVENOX) injection  0.5 mg/kg Subcutaneous Q24H   gabapentin  800 mg Oral BID   insulin aspart  0-5 Units Subcutaneous QHS   insulin aspart  0-9 Units Subcutaneous TID WC   insulin aspart  3 Units Subcutaneous TID WC   iron polysaccharides  150 mg Oral Daily   losartan  25 mg Oral Daily   spironolactone  50 mg Oral Daily   torsemide  20 mg Oral Daily   Continuous Infusions: PRN Meds:.alum & mag hydroxide-simeth, hydrALAZINE, HYDROmorphone (DILAUDID) injection, ondansetron **OR** ondansetron (ZOFRAN) IV, mouth rinse, oxyCODONE Medications Prior to Admission:  Prior to Admission medications   Medication Sig Start Date End Date Taking? Authorizing Provider  aspirin EC 81 MG tablet Take 1 tablet (81 mg total) by mouth daily. 11/26/22  Yes Delfino Lovett, MD  atorvastatin (LIPITOR) 80 MG tablet Take 1 tablet (80 mg total) by mouth daily. Patient taking differently: Take 80 mg by mouth at bedtime. 11/26/22  Yes Delfino Lovett, MD  carvedilol (COREG) 25 MG tablet Take 25 mg by mouth 2 (two) times daily with a meal.   Yes [provider]  clopidogrel (PLAVIX) 75 MG tablet Take 1 tablet (75 mg total) by mouth daily. 09/18/22  Yes Georgiana Spinner, NP  fluticasone (FLONASE) 50 MCG/ACT nasal spray Place 1 spray into both nostrils daily as needed for allergies or rhinitis.   Yes [provider]  gabapentin (NEURONTIN) 400 MG capsule Take 800 mg by mouth 2 (two) times daily. 12/06/21  Yes [provider]  insulin aspart protamine- aspart (NOVOLOG MIX 70/30) (70-30) 100 UNIT/ML injection Inject 30 Units into the skin 2 (two) times daily with a meal.   Yes [provider]  levocetirizine (XYZAL) 5 MG tablet Take 5 mg by mouth every evening.   Yes [provider]  senna-docusate (SENOKOT-S) 8.6-50 MG  tablet Take 1 tablet by mouth at bedtime as needed for mild constipation. 06/17/21  Yes Burnadette Pop, MD  spironolactone (ALDACTONE) 50 MG tablet Take 1 tablet (50 mg total) by mouth daily. 11/26/22 01/01/24 Yes Delfino Lovett, MD  Ensure Max Protein (ENSURE MAX PROTEIN) LIQD Take 330 mLs (11 oz total) by mouth 2 (two) times daily. Patient taking differently: Take 11 oz by mouth daily. 11/26/22   Delfino Lovett, MD  nystatin (MYCOSTATIN/NYSTOP) powder Apply 1 Application topically 3 (three) times daily. Patient not taking: Reported on 01/09/2023 12/12/22   Georgiana Spinner, NP   No Known Allergies Review of Systems  Genitourinary:  Positive for difficulty urinating.    Physical Exam Pulmonary:     Effort: Pulmonary effort is normal.  Neurological:     Mental Status: He is alert.     Vital Signs: BP (!) 155/72 (BP Location: Left Arm)   Pulse 69   Temp 98.5 F (36.9 C) (Oral)   Resp 20   Ht 5\' 8"  (1.727 m)   Wt 90 kg   SpO2 98%   BMI 30.17 kg/m  Pain Scale: 0-10   Pain Score: 0-No pain   SpO2: SpO2: 98 % O2 Device:SpO2: 98 % O2 Flow Rate: .O2 Flow Rate (L/min): 1 L/min  IO: Intake/output summary:  Intake/Output Summary (Last 24 hours) at 01/17/2023 1635 Last data filed at 01/17/2023 1423 Gross per 24 hour  Intake 720 ml  Output 400 ml  Net 320 ml    LBM: Last BM Date : 01/16/23 Baseline Weight: Weight: 84.4 kg Most recent weight: Weight: 90 kg      Signed  by: Morton Stall, NP   Please contact Palliative Medicine Team phone at [phone] for questions and concerns.  For individual provider: See Loretha Stapler

## 2023-01-17 NOTE — Progress Notes (Signed)
Physical Therapy Treatment Patient Details Name: Manuel [name] MRN: [id number] DOB: May 08, 1945 Today's Date: 01/17/2023   History of Present Illness Patient is a 77 y.o. 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  Patient reports worsening shortness of breath, increased work of breathing over the past 4 to 5 days. Current MD assessment:  NSTEMI, Acute on chronic combined systolic and diastolic CHF, Ischemic ulcer of lower extremity, and T2DM.    PT Comments  Pt was motivated to participate during the session following cues and encouragement, putting forth good effort throughout. Pt seated EOB upon entry, assisted with donning wedge shoe on RLE and sneaker on LLE. Pt performed STS at supervision level, with fair balance standing with RW. Pt able to amb ~80 feet to nurses station and back with RW and CGA. Pt showing improved gait mechanics with intermittent step-through pattern allowing pt to keep consistent speed. No cues required for AD management or to maintain upright posture. Pt will benefit from continued PT services upon discharge to safely address deficits listed in patient problem list for decreased caregiver assistance and eventual return to PLOF.      If plan is discharge home, recommend the following: A little help with walking and/or transfers;Assist for transportation;Help with stairs or ramp for entrance;A little help with bathing/dressing/bathroom   Can travel by private vehicle        Equipment Recommendations  None recommended by PT    Recommendations for Other Services       Precautions / Restrictions Precautions Precautions: Fall Restrictions Weight Bearing  Restrictions: Yes Other Position/Activity Restrictions: Per PT gaining MD conformation, "wedge surgical shoe. Pressure only on the heel when walking"     Mobility  Bed Mobility               General bed mobility comments: Pt seated EOB at start/end of session, donned wedge shoe at EOB    Transfers Overall transfer level: Needs assistance Equipment used: Rolling walker (2 wheels) Transfers: Sit to/from Stand Sit to Stand: Supervision           General transfer comment: slow but steady    Ambulation/Gait Ambulation/Gait assistance: Contact guard assist Gait Distance (Feet): 80 Feet     Gait velocity: decreased     General Gait Details: Pt improving with gait sequencing, progressed with intermittend step-through pattern, keeping a consistent speed though still decreased   Stairs             Wheelchair Mobility     Tilt Bed    Modified Rankin (Stroke Patients Only)       Balance Overall balance assessment: Needs assistance Sitting-balance support: Feet supported Sitting balance-Leahy Scale: Normal     Standing balance support: During functional activity, No upper extremity supported Standing balance-Leahy Scale: Fair Standing balance comment: Dynamic standing with RW                            Cognition Arousal: Alert Behavior During Therapy: WFL for tasks assessed/performed Overall Cognitive Status: Within Functional Limits for tasks assessed  Exercises      General Comments        Pertinent Vitals/Pain Pain Assessment Pain Assessment: Faces Faces Pain Scale: No hurt    Home Living                          Prior Function            PT Goals (current goals can now be found in the care plan section) Progress towards PT goals: Progressing toward goals    Frequency    Min 1X/week      PT Plan      Co-evaluation              AM-PAC  PT "6 Clicks" Mobility   Outcome Measure  Help needed turning from your back to your side while in a flat bed without using bedrails?: A Little Help needed moving from lying on your back to sitting on the side of a flat bed without using bedrails?: A Little Help needed moving to and from a bed to a chair (including a wheelchair)?: A Little Help needed standing up from a chair using your arms (e.g., wheelchair or bedside chair)?: A Little Help needed to walk in hospital room?: A Little Help needed climbing 3-5 steps with a railing? : A Little 6 Click Score: 18    End of Session Equipment Utilized During Treatment: Gait belt Activity Tolerance: Patient tolerated treatment well Patient left: Other (comment);with family/visitor present;with call bell/phone within reach (Seated EOB) Nurse Communication: Mobility status PT Visit Diagnosis: Other abnormalities of gait and mobility (R26.89);Difficulty in walking, not elsewhere classified (R26.2);Unsteadiness on feet (R26.81)     Time: 1610-9604 PT Time Calculation (min) (ACUTE ONLY): 20 min  Charges:                            Cecile Sheerer, SPT 01/17/23, 4:30 PM

## 2023-01-17 NOTE — Progress Notes (Addendum)
[facility] HF Stewardship  PCP: Gracelyn Nurse, MD  PCP-Cardiologist: None  HPI: Manuel [name] is a 77 y.o. male with  HFrEF, insulin-dependent diabetes, hypertension, CAD, PAD status post angioplasty with stent 2023 as well as recent admission for lower limb ischemia with multiple vascular procedures July through August 2024 who presented with worsening shortness of breath over 4-5 days. Troponin in the 400s on admission with normal lactic acid and elevated BNP of 1750.5.  Pertinent cardiac history: Acute inferior MI in 2011 treated with 2 DES. Cardiac cath in 2013 showed 80% ramus, 50-70% proximal first diagonal, and multiple 90% lesions of RCA treated with 3 overlapping DES. TTE in 2015 showed normal LVEF. TTE in 2020 showed LVEF of 40-45%. TTE in 10/2022 showed LVEF of 25-30% with mild-moderate MR and TR. Previous history of vascular disease s/p intervention in 2023 and 2024.  Pertinent Lab Values: Creatinine  Date Value Ref Range Status  02/16/2012 0.72 0.60 - 1.30 mg/dL Final   Creatinine, Ser  Date Value Ref Range Status  01/17/2023 0.85 0.61 - 1.24 mg/dL Final   BUN  Date Value Ref Range Status  01/17/2023 34 (H) 8 - 23 mg/dL Final  16/01/9603 10 7 - 18 mg/dL Final   Potassium  Date Value Ref Range Status  01/17/2023 3.8 3.5 - 5.1 mmol/L Final  02/16/2012 3.8 3.5 - 5.1 mmol/L Final   Sodium  Date Value Ref Range Status  01/17/2023 130 (L) 135 - 145 mmol/L Final  02/16/2012 137 136 - 145 mmol/L Final   B Natriuretic Peptide  Date Value Ref Range Status  01/15/2023 1,760.5 (H) 0.0 - 100.0 pg/mL Final    Comment:    Performed at [facility], [address] [place], [place] [address]   Magnesium  Date Value Ref Range Status  01/17/2023 2.0 1.7 - 2.4 mg/dL Final    Comment:    Performed at [facility], [address] [place], [place] [address]   Hgb A1c MFr Bld  Date Value Ref Range Status  11/10/2022 6.9 (H) 4.8 - 5.6 % Final    Comment:     (NOTE)         Prediabetes: 5.7 - 6.4         Diabetes: >6.4         Glycemic control for adults with diabetes: <7.0     Vital Signs: Temp:  [97.5 F (36.4 C)-99.5 F (37.5 C)] 97.5 F (36.4 C) (09/26 2339) Pulse Rate:  [70-78] 76 (09/26 2339) Cardiac Rhythm: Normal sinus rhythm (09/26 2108) Resp:  [18-20] 18 (09/26 2339) BP: (120-156)/(55-77) 156/76 (09/26 2339) SpO2:  [94 %-100 %] 98 % (09/26 2339)   Intake/Output Summary (Last 24 hours) at 01/17/2023 0745 Last data filed at 01/16/2023 2108 Gross per 24 hour  Intake 786.52 ml  Output 100 ml  Net 686.52 ml    Current Inpatient Medications:  -Furosemide 60 mg IV BID -Carvedilol 25 mg BID -Losartan 25 mg BID -Spironolactone 50 mg daily  Prior to admission HF Medications:  -Torsemide 20 mg daily -Carvedilol 25 mg BID -Losartan 25 mg daily -Spironolactone 50 mg daily  Assessment: 1. Acute on chronic systolic heart failure (LVEF 25-30%), due to ICM. NYHA class II-III symptoms.  -BP now elevated. Home losartan and spironolactone ordered. Pending BP trends, can consider transition to [facility] for additional benefit to readmission and mortality.  -I/Os not charted. Admit weight recorded at 198.41, none charted since. Transitioned to torsemide 20 mg per cardiology.  Creatinine stable, BUN trending up. Can consider  -Can consider adding SGLT2i -Can consider transitioning from losartan to [facility] if pursuing full scope of care.  Plan: 1) Medication changes recommended at this time: -Consider adding Farxiga 10 mg daily -Agree with transition to oral torsemide  2) Patient assistance: -Sherryll Burger is $47.00 -Marcelline Deist is $47.00 for brand and generic not covered -Jardiance is $47.00   3) Education: - Patient has been educated on current HF medications and potential additions to HF medication regimen - Patient verbalizes understanding that over the next few months, these medication doses may change and more medications may be  added to optimize HF regimen - Patient has been educated on basic disease state pathophysiology and goals of therapy  Medication Assistance / Insurance Benefits Check:  Does the patient have prescription insurance?    Type of insurance plan:   Does the patient qualify for medication assistance through manufacturers or grants? No  Outpatient Pharmacy:  Prior to admission outpatient pharmacy: [facility]     Thank you for involving pharmacy in this patient's care.  Enos Fling, PharmD, BCPS Phone - [phone] Clinical Pharmacist 01/17/2023 7:45 AM

## 2023-01-17 NOTE — Care Management Important Message (Signed)
Important Message  Patient Details  Name: Manuel [name] MRN: [id number] Date of Birth: 08/14/1945   Important Message Given:  Yes - Medicare IM  Patient asleep during time of visit, no family in room.  Copy of Medicare IM left on counter in room for reference.   Johnell Comings 01/17/2023, 9:36 AM

## 2023-01-18 DIAGNOSIS — Z515 Encounter for palliative care: Secondary | ICD-10-CM

## 2023-01-18 DIAGNOSIS — I214 Non-ST elevation (NSTEMI) myocardial infarction: Secondary | ICD-10-CM | POA: Diagnosis not present

## 2023-01-18 DIAGNOSIS — L97909 Non-pressure chronic ulcer of unspecified part of unspecified lower leg with unspecified severity: Secondary | ICD-10-CM | POA: Diagnosis not present

## 2023-01-18 DIAGNOSIS — I5023 Acute on chronic systolic (congestive) heart failure: Secondary | ICD-10-CM | POA: Diagnosis not present

## 2023-01-18 LAB — CBC
HCT: 34 % — ABNORMAL LOW (ref 39.0–52.0)
Hemoglobin: 11.6 g/dL — ABNORMAL LOW (ref 13.0–17.0)
MCH: 28.2 pg (ref 26.0–34.0)
MCHC: 34.1 g/dL (ref 30.0–36.0)
MCV: 82.7 fL (ref 80.0–100.0)
Platelets: 372 10*3/uL (ref 150–400)
RBC: 4.11 MIL/uL — ABNORMAL LOW (ref 4.22–5.81)
RDW: 12.8 % (ref 11.5–15.5)
WBC: 10.8 10*3/uL — ABNORMAL HIGH (ref 4.0–10.5)
nRBC: 0 % (ref 0.0–0.2)

## 2023-01-18 LAB — MAGNESIUM: Magnesium: 2.2 mg/dL (ref 1.7–2.4)

## 2023-01-18 LAB — BASIC METABOLIC PANEL WITH GFR
Anion gap: 8 (ref 5–15)
BUN: 31 mg/dL — ABNORMAL HIGH (ref 8–23)
CO2: 28 mmol/L (ref 22–32)
Calcium: 8.8 mg/dL — ABNORMAL LOW (ref 8.9–10.3)
Chloride: 97 mmol/L — ABNORMAL LOW (ref 98–111)
Creatinine, Ser: 0.88 mg/dL (ref 0.61–1.24)
GFR, Estimated: >60 mL/min
Glucose, Bld: 200 mg/dL — ABNORMAL HIGH (ref 70–99)
Potassium: 4 mmol/L (ref 3.5–5.1)
Sodium: 133 mmol/L — ABNORMAL LOW (ref 135–145)

## 2023-01-18 LAB — GLUCOSE, CAPILLARY
Glucose-Capillary: 190 mg/dL — ABNORMAL HIGH (ref 70–99)
Glucose-Capillary: 160 mg/dL — ABNORMAL HIGH (ref 70–99)
Glucose-Capillary: 149 mg/dL — ABNORMAL HIGH (ref 70–99)
Glucose-Capillary: 147 mg/dL — ABNORMAL HIGH (ref 70–99)

## 2023-01-18 LAB — PHOSPHORUS: Phosphorus: 4.1 mg/dL (ref 2.5–4.6)

## 2023-01-18 NOTE — Progress Notes (Signed)
Daily Progress Note   Patient Name: Manuel [name]       Date: 01/18/2023 DOB: [birth year]  Age: 77 y.o. MRN#: [id number] Attending Physician: Gillis Santa, MD Primary Care Physician: Gracelyn Nurse, MD Admit Date: 01/15/2023  Reason for Consultation/Follow-up: Establishing goals of care  HPI/Brief Hospital Review:  Manuel [name] is a 77 y.o. 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 Cardiology was consulted for further evaluation.   Palliative Medicine consulted for assisting with goals of care conversations.  Subjective: Extensive chart review has been completed prior to meeting patient including labs, vital signs, imaging, progress notes, orders, and available advanced directive documents from current and previous encounters.    Visited with [name] at his bedside, he is awake, alert, able to recall our previous visits and conversations had during previous hospitalizations.  Manuel [name] shares his frustrations related to healing process from vascular procedures. He shares he is aware he will likely need amputation at some point. He shares having difficulty breathing which prompted EMS to be called resulting in admission-reports feeling much better regarding those symptoms and aware cardiology team adjusting medications.  He shares his granddaughter recently got married last weekend and other family updates.  Discussed goals of care. During previous visits as  noted, Manuel [name] clear in his wishes for DNR/DNI. Confirmed again with Manuel [name] he would not be accepting of resuscitation and would never want to be placed on mechanical ventilation-order updated to reflect wishes.  Called and spoke with wife-Manuel [name], updates provided, answered and addressed her questions and concerns.  PMT to continue to follow for ongoing needs and support.  Care plan was discussed with nursing staff.  Thank you for allowing the Palliative Medicine Team to assist in the care of this patient.  Total time:  50 minutes  Time spent includes: Detailed review of medical records (labs, imaging, vital signs), medically appropriate exam (mental status, respiratory, cardiac, skin), discussed with treatment team, counseling and educating patient, family and staff, documenting clinical information,  medication management and coordination of care.  Leeanne Deed, DNP, AGNP-C Palliative Medicine   Please contact Palliative Medicine Team phone at [phone] for questions and concerns.

## 2023-01-18 NOTE — Plan of Care (Signed)
  Problem: Education: Goal: Knowledge of prescribed regimen will improve Outcome: Progressing   Problem: Activity: Goal: Ability to tolerate increased activity will improve Outcome: Progressing   Problem: Skin Integrity: Goal: Demonstration of wound healing without infection will improve Outcome: Progressing   Problem: Education: Goal: Ability to demonstrate management of disease process will improve Outcome: Progressing Goal: Ability to verbalize understanding of medication therapies will improve Outcome: Progressing   Problem: Activity: Goal: Capacity to carry out activities will improve Outcome: Progressing   Problem: Cardiac: Goal: Ability to achieve and maintain adequate cardiopulmonary perfusion will improve Outcome: Progressing   Problem: Coping: Goal: Ability to adjust to condition or change in health will improve Outcome: Progressing   Problem: Fluid Volume: Goal: Ability to maintain a balanced intake and output will improve Outcome: Progressing   Problem: Metabolic: Goal: Ability to maintain appropriate glucose levels will improve Outcome: Progressing   Problem: Nutritional: Goal: Maintenance of adequate nutrition will improve Outcome: Progressing   Problem: Tissue Perfusion: Goal: Adequacy of tissue perfusion will improve Outcome: Progressing

## 2023-01-18 NOTE — Progress Notes (Signed)
Patient ID: Manuel [name], male   DOB: 02/19/1946, 77 y.o.   MRN: [id number] [facility]    SUBJECTIVE: Patient states she did not sleep at recurrent persistent sharp pain in the right groin area every 5 to 10 minutes which was new since last night wound VAC in place no evidence of redness there is interval small foul odor has not had a dressing change since Tuesday states he is waiting to have the dressing change and potential imaging and assessment of the area otherwise he feels reasonably well he was planning to go home today but is realized that that was not a good idea   Vitals:   01/17/23 2003 01/18/23 0044 01/18/23 0405 01/18/23 0814  BP: 134/66 (!) 159/82 (!) 157/77 (!) 154/77  Pulse: 72 73 71 75  Resp: 20 16 16 18   Temp: 99.2 F (37.3 C) 98.3 F (36.8 C) 98 F (36.7 C) 99 F (37.2 C)  TempSrc: Oral  Oral   SpO2: 96% 97% 98% 97%  Weight:      Height:         Intake/Output Summary (Last 24 hours) at 01/18/2023 1152 Last data filed at 01/18/2023 4098 Gross per 24 hour  Intake 240 ml  Output 2150 ml  Net -1910 ml      PHYSICAL EXAM  General: Well developed, well nourished, in no acute distress HEENT:  Normocephalic and atramatic Neck:  No JVD.  Lungs: Clear bilaterally to auscultation and percussion. Heart: HRRR . Normal S1 and S2 without gallops or murmurs.  Abdomen: Bowel sounds are positive, abdomen soft and non-tender  Msk:  Back normal, normal gait. Normal strength and tone for age. Extremities: No clubbing, cyanosis or edema.  Wound VAC right groin Neuro: Alert and oriented X 3. Psych:  Good affect, responds appropriately   LABS: Basic Metabolic Panel: Recent Labs    01/17/23 0451 01/18/23 0457  NA 130* 133*  K 3.8 4.0  CL 95* 97*  CO2 24 28  GLUCOSE 157* 200*  BUN 34* 31*  CREATININE 0.85 0.88  CALCIUM 8.4* 8.8*  MG 2.0 2.2  PHOS 3.5 4.1   Liver Function Tests: Recent Labs    01/16/23 0151  AST 22  ALT 17  ALKPHOS 61  BILITOT 0.9   PROT 6.6  ALBUMIN 2.9*   No results for input(s): "LIPASE", "AMYLASE" in the last 72 hours. CBC: Recent Labs    01/17/23 0451 01/18/23 0457  WBC 10.7* 10.8*  HGB 10.4* 11.6*  HCT 30.5* 34.0*  MCV 81.8 82.7  PLT 301 372   Cardiac Enzymes: No results for input(s): "CKTOTAL", "CKMB", "CKMBINDEX", "TROPONINI" in the last 72 hours. BNP: Invalid input(s): "POCBNP" D-Dimer: No results for input(s): "DDIMER" in the last 72 hours. Hemoglobin A1C: No results for input(s): "HGBA1C" in the last 72 hours. Fasting Lipid Panel: No results for input(s): "CHOL", "HDL", "LDLCALC", "TRIG", "CHOLHDL", "LDLDIRECT" in the last 72 hours. Thyroid Function Tests: No results for input(s): "TSH", "T4TOTAL", "T3FREE", "THYROIDAB" in the last 72 hours.  Invalid input(s): "FREET3" Anemia Panel: Recent Labs    01/16/23 0151 01/16/23 0748  VITAMINB12  --  428  FOLATE 16.1  --   TIBC 245*  --   IRON 17*  --     No results found.   Echo with preserved ventricular function EF of 25 to 30%  TELEMETRY: Sinus rhythm rate of 71:  ASSESSMENT AND PLAN:  Principal Problem:   NSTEMI (non-ST elevated myocardial infarction) (HCC) Active Problems:  Hypertension   Diabetes mellitus type 2, insulin dependent (HCC)   Diabetic polyneuropathy associated with type 2 diabetes mellitus (HCC)   Mixed hyperlipidemia   Alcohol abuse   Ischemic ulcer of lower extremity (HCC)    Plan Elevated troponin probably demand ischemia doubt non-STEMI Hypertension continue Coreg losartan spironolactone torsemide hydralazine Farxiga Ischemic cardiomyopathy EF around 25 to 30% no chest pain continue aggressive medical therapy aspirin Plavix statin ARB beta-blocker Peripheral vascular disease status post surgery poor wound healing right groin currently wound VAC in place continue current management Pain right groin area around wound VAC site recommend further evaluation change dressing consider imaging rule out abscess  or worsening infection Diabetes stable on NovoLog as well as Farxiga Continue pain management for right groin discomfort   Manuel Pea, MD 01/18/2023 11:52 AM

## 2023-01-18 NOTE — Progress Notes (Signed)
[facility] Progress Note  Patient: Manuel [name]    JYN:[id number]  DOA: 01/15/2023     Date of Service: the patient was seen and examined on 01/18/2023  Chief Complaint  Patient presents with   Abdominal Pain   Brief hospital course: Manuel [name] is a 77 y.o. male with medical history significant of HFrEF, insulin-dependent diabetes, hypertension, CAD, PAD status post angioplasty with stent 2023 as well as recent admission for lower limb ischemia with multiple vascular procedures July through August 2024 presenting with acute on chronic HFrEF.  Patient reports worsening shortness of breath, increased work of breathing over the past 4 to 5 days.  Does also report neuropathic pain with symptoms being poorly controlled with Neurontin.  Positive orthopnea, PND.  Does report?  Partial compliance with diuretic regimen.  No reported high salt or NSAID intake.  Prior alcohol use.  No reported active alcohol use at present.  No abdominal pain.  No diar white count 14, hemoglobin 11.7, platelets 345, troponin 430s to 470s.  Creatinine 0.93.  Glucose 180s.  BNP is 1760.  Chest x-ray positive cardiomegaly.  Rhea.  Recently had I&D done with vascular surgery September 19.  Wound VAC has been stable. Presented to the ER afebrile, hemodynamically stable.  Satting well on room air.   Assessment and Plan:  Acute on chronic combined systolic and diastolic CHF (congestive heart failure)  2D echo July 2024 with EF of 20 to 25% Acute decompensated HFrEF with increased work of breathing, orthopnea, PND as well as lower extremity swelling over the past few days BNP 1700 with noted cardiomegaly on chest x-ray S/p IV Lasix in the ER Continue with diuresis as well as heart failure protocol Cardiology consulted, started Farxiga 10 mg p.o. daily, IV Lasix switched to torsemide oral.  Continue spironolactone 50 mg p.o. daily, Coreg 25 mg p.o. twice daily and losartan 20 mg p.o. daily (was taking twice daily  at home) Keep K >4 and Mag >2  NSTEMI (non-ST elevated myocardial infarction)  Troponin into the 400s in the setting of active decompensated heart failure EKG stable. No active chest pain. Suspect secondary demand ischemia in setting of heart failure exacerbation. 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 Recent wound debridement by vascular surgery September 19 Wound VAC in place, it was leaking air so it was fixed and dressing appears to be stable-CDI  S/p Hparin drip, discontinued as per cardiology Continue statin Vascular surgery consulted Wound care RN consulted for wound VAC management     Diabetic polyneuropathy associated with type 2 diabetes mellitus Patient with worsening pain despite oral Neurontin regimen Consider discussion with vascular surgery to better assess pain regimen in the setting of PAD Started oxycodone 5 mg p.o. every 6 hourly prn and Dilaudid IV as needed for breakthrough pain 9/27 started amitriptyline 25 mg p.o. nightly for neuropathy as well as for depression Follow-up   Hypertension BP stable  Titrate home regimen      Diabetes mellitus type 2, insulin dependent (HCC) SSI  A1C  Alcohol abuse Noted prior hx/o ETOH use  No reported active ETOH use at present  Monitor      Mixed hyperlipidemia Statin  Iron deficiency anemia Started oral iron supplement with vitamin C Follow-up with PCP to repeat iron profile after 3  to 6 months Folic acid and B12 levels within normal range Generalized weakness, vitamin D within normal range  Difficulty urinating, possible BPH Started Flomax 0.4 mg p.o. nightly  Body mass index is 30.17 kg/m.  Interventions:  Diet: Carb modified diet DVT Prophylaxis: Subcutaneous Lovenox   Advance goals of care discussion: DNR  Family Communication: family was present at bedside, at the time of interview.  The pt provided permission to discuss medical plan with the family. Opportunity was given to ask question and all questions were answered satisfactorily.   Disposition:  Pt is from Home, admitted with CHF, PVD status post left angio, right groin wound VAC, complaining of pain, Which precludes a safe discharge. Discharge to Home, when stable and cleared by cardiology.  DC most likely in 1 to 2 days  Subjective: No significant overnight events, patient stated that he had pain throughout the night in the right groin and inguinal area so he could not sleep, pain is 10 out of 10 and it is intermittent.  Patient would like to be seen by vascular surgery. RN was advised to call wound care nurse to change wound VAC dressing.   Physical Exam: General: NAD, lying comfortably Appear in no distress,  Eyes: Closed, patient was sleeping ENT: Patient was sleeping, mouth was closed Neck: no JVD,  Cardiovascular: S1 and S2 Present, no Murmur,  Respiratory: good respiratory effort, Bilateral Air entry equal and Decreased, no Crackles, no wheezes Abdomen: Bowel Sound present, Soft and no tenderness, Right groin wound VAC attached Skin: no rashes Extremities: 2-3+ pedal edema, no calf tenderness Neurologic: without any new focal findings Gait not checked due to patient safety concerns  Vitals:   01/18/23 0044 01/18/23 0405 01/18/23 0814 01/18/23 1235  BP: (!) 159/82 (!) 157/77 (!) 154/77 137/74  Pulse: 73 71 75 68  Resp: 16 16 18  (!) 22  Temp: 98.3 F (36.8 C) 98 F (36.7 C) 99 F  (37.2 C) 97.6 F (36.4 C)  TempSrc:  Oral    SpO2: 97% 98% 97% 99%  Weight:      Height:        Intake/Output Summary (Last 24 hours) at 01/18/2023 1436 Last data filed at 01/18/2023 1159 Gross per 24 hour  Intake --  Output 2350 ml  Net -2350 ml   Filed Weights   01/15/23 0724 01/16/23 0011 01/16/23 0500  Weight: 84.4 kg 90 kg 90 kg    Data Reviewed: I have personally reviewed and interpreted daily labs, tele strips, imagings as discussed above. I reviewed all nursing notes, pharmacy notes, vitals, pertinent old records I have discussed plan of care as described above with RN and patient/family.  CBC: Recent Labs  Lab 01/15/23 0754 01/16/23 0151 01/17/23 0451 01/18/23 0457  WBC 14.0* 13.5* 10.7* 10.8*  NEUTROABS 11.7*  --   --   --   HGB 11.7* 11.1* 10.4* 11.6*  HCT 35.7* 32.9* 30.5* 34.0*  MCV 85.0 84.6 81.8 82.7  PLT 345 292 301 372   Basic Metabolic Panel: Recent Labs  Lab 01/15/23 0754 01/16/23 0151 01/17/23 0451 01/18/23 0457  NA 129* 128* 130* 133*  K 4.5 4.2 3.8 4.0  CL 97* 94* 95* 97*  CO2 23 24 24  28  GLUCOSE 187* 176* 157* 200*  BUN 23 29* 34* 31*  CREATININE 0.93 0.98 0.85 0.88  CALCIUM 8.5* 8.3* 8.4* 8.8*  MG  --  1.9 2.0 2.2  PHOS  --   --  3.5 4.1    Studies: No results found.  Scheduled Meds:  amitriptyline  25 mg Oral QHS   ascorbic acid  500 mg Oral Daily   aspirin EC  81 mg Oral Daily   atorvastatin  80 mg Oral QHS   carvedilol  25 mg Oral BID WC   clopidogrel  75 mg Oral Daily   dapagliflozin propanediol  10 mg Oral Daily   enoxaparin (LOVENOX) injection  0.5 mg/kg Subcutaneous Q24H   gabapentin  800 mg Oral BID   insulin aspart  0-5 Units Subcutaneous QHS   insulin aspart  0-9 Units Subcutaneous TID WC   insulin aspart  3 Units Subcutaneous TID WC   iron polysaccharides  150 mg Oral Daily   losartan  25 mg Oral Daily   spironolactone  50 mg Oral Daily   tamsulosin  0.4 mg Oral QPC supper   torsemide  20 mg Oral Daily    Continuous Infusions:   PRN Meds: alum & mag hydroxide-simeth, hydrALAZINE, HYDROmorphone (DILAUDID) injection, ondansetron **OR** ondansetron (ZOFRAN) IV, mouth rinse, oxyCODONE  Time spent: 35 minutes  Author: Gillis Santa. MD [facility] 01/18/2023 2:36 PM  To reach On-call, see care teams to locate the attending and reach out to them via [email or web address]. If 7PM-7AM, please contact night-coverage If you still have difficulty reaching the attending provider, please page the [facility] (Director on Call) for [facility] on amion for assistance.

## 2023-01-18 NOTE — Plan of Care (Signed)
  Problem: Education: Goal: Knowledge of prescribed regimen will improve Outcome: Progressing   Problem: Activity: Goal: Ability to tolerate increased activity will improve Outcome: Progressing   Problem: Bowel/Gastric: Goal: Gastrointestinal status for postoperative course will improve Outcome: Progressing   Problem: Clinical Measurements: Goal: Postoperative complications will be avoided or minimized Outcome: Progressing Goal: Signs and symptoms of graft occlusion will improve Outcome: Progressing   Problem: Skin Integrity: Goal: Demonstration of wound healing without infection will improve Outcome: Progressing   Problem: Education: Goal: Ability to demonstrate management of disease process will improve Outcome: Progressing Goal: Ability to verbalize understanding of medication therapies will improve Outcome: Progressing Goal: Individualized Educational Video(s) Outcome: Progressing   Problem: Activity: Goal: Capacity to carry out activities will improve Outcome: Progressing   Problem: Cardiac: Goal: Ability to achieve and maintain adequate cardiopulmonary perfusion will improve Outcome: Progressing   Problem: Education: Goal: Ability to describe self-care measures that may prevent or decrease complications (Diabetes Survival Skills Education) will improve Outcome: Progressing Goal: Individualized Educational Video(s) Outcome: Progressing   Problem: Coping: Goal: Ability to adjust to condition or change in health will improve Outcome: Progressing   Problem: Fluid Volume: Goal: Ability to maintain a balanced intake and output will improve Outcome: Progressing   Problem: Health Behavior/Discharge Planning: Goal: Ability to identify and utilize available resources and services will improve Outcome: Progressing Goal: Ability to manage health-related needs will improve Outcome: Progressing   Problem: Metabolic: Goal: Ability to maintain appropriate glucose  levels will improve Outcome: Progressing   Problem: Nutritional: Goal: Maintenance of adequate nutrition will improve Outcome: Progressing Goal: Progress toward achieving an optimal weight will improve Outcome: Progressing   Problem: Skin Integrity: Goal: Risk for impaired skin integrity will decrease Outcome: Progressing   Problem: Tissue Perfusion: Goal: Adequacy of tissue perfusion will improve Outcome: Progressing   Problem: Education: Goal: Knowledge of General Education information will improve Description: Including pain rating scale, medication(s)/side effects and non-pharmacologic comfort measures Outcome: Progressing   Problem: Health Behavior/Discharge Planning: Goal: Ability to manage health-related needs will improve Outcome: Progressing   Problem: Clinical Measurements: Goal: Ability to maintain clinical measurements within normal limits will improve Outcome: Progressing Goal: Will remain free from infection Outcome: Progressing Goal: Diagnostic test results will improve Outcome: Progressing Goal: Respiratory complications will improve Outcome: Progressing Goal: Cardiovascular complication will be avoided Outcome: Progressing   Problem: Activity: Goal: Risk for activity intolerance will decrease Outcome: Progressing   Problem: Nutrition: Goal: Adequate nutrition will be maintained Outcome: Progressing   Problem: Coping: Goal: Level of anxiety will decrease Outcome: Progressing   Problem: Elimination: Goal: Will not experience complications related to bowel motility Outcome: Progressing Goal: Will not experience complications related to urinary retention Outcome: Progressing   Problem: Pain Managment: Goal: General experience of comfort will improve Outcome: Progressing   Problem: Safety: Goal: Ability to remain free from injury will improve Outcome: Progressing   Problem: Skin Integrity: Goal: Risk for impaired skin integrity will  decrease Outcome: Progressing

## 2023-01-18 NOTE — Plan of Care (Signed)
  Problem: Activity: Goal: Ability to tolerate increased activity will improve Outcome: Progressing   Problem: Clinical Measurements: Goal: Postoperative complications will be avoided or minimized Outcome: Progressing   Problem: Clinical Measurements: Goal: Signs and symptoms of graft occlusion will improve Outcome: Progressing   Problem: Skin Integrity: Goal: Demonstration of wound healing without infection will improve Outcome: Progressing   Problem: Pain Managment: Goal: General experience of comfort will improve Outcome: Progressing   Problem: Safety: Goal: Ability to remain free from injury will improve Outcome: Progressing

## 2023-01-19 DIAGNOSIS — I214 Non-ST elevation (NSTEMI) myocardial infarction: Secondary | ICD-10-CM | POA: Diagnosis not present

## 2023-01-19 LAB — CBC
HCT: 32.8 % — ABNORMAL LOW (ref 39.0–52.0)
Hemoglobin: 11 g/dL — ABNORMAL LOW (ref 13.0–17.0)
MCH: 27.8 pg (ref 26.0–34.0)
MCHC: 33.5 g/dL (ref 30.0–36.0)
MCV: 83 fL (ref 80.0–100.0)
Platelets: 353 10*3/uL (ref 150–400)
RBC: 3.95 MIL/uL — ABNORMAL LOW (ref 4.22–5.81)
RDW: 12.6 % (ref 11.5–15.5)
WBC: 10.6 10*3/uL — ABNORMAL HIGH (ref 4.0–10.5)
nRBC: 0 % (ref 0.0–0.2)

## 2023-01-19 LAB — GLUCOSE, CAPILLARY
Glucose-Capillary: 125 mg/dL — ABNORMAL HIGH (ref 70–99)
Glucose-Capillary: 213 mg/dL — ABNORMAL HIGH (ref 70–99)

## 2023-01-19 LAB — BASIC METABOLIC PANEL
Anion gap: 10 (ref 5–15)
BUN: 26 mg/dL — ABNORMAL HIGH (ref 8–23)
CO2: 25 mmol/L (ref 22–32)
Calcium: 8.7 mg/dL — ABNORMAL LOW (ref 8.9–10.3)
Chloride: 98 mmol/L (ref 98–111)
Creatinine, Ser: 0.88 mg/dL (ref 0.61–1.24)
GFR, Estimated: >60 mL/min (ref >60)
Glucose, Bld: 133 mg/dL — ABNORMAL HIGH (ref 70–99)
Potassium: 3.5 mmol/L (ref 3.5–5.1)
Sodium: 133 mmol/L — ABNORMAL LOW (ref 135–145)

## 2023-01-19 LAB — PHOSPHORUS: Phosphorus: 4 mg/dL (ref 2.5–4.6)

## 2023-01-19 LAB — MAGNESIUM: Magnesium: 2 mg/dL (ref 1.7–2.4)

## 2023-01-19 MED ORDER — DAPAGLIFLOZIN PROPANEDIOL 10 MG PO TABS: 10.0000 mg | ORAL_TABLET | Freq: Every day | ORAL | 2 refills | Status: DC

## 2023-01-19 MED ORDER — TORSEMIDE 20 MG PO TABS: 20.0000 mg | ORAL_TABLET | Freq: Every day | ORAL | 2 refills | Status: DC

## 2023-01-19 MED ORDER — AMITRIPTYLINE HCL 25 MG PO TABS: 25.0000 mg | ORAL_TABLET | Freq: Every day | ORAL | 2 refills | Status: DC

## 2023-01-19 MED ORDER — ASCORBIC ACID 500 MG PO TABS: 500.0000 mg | ORAL_TABLET | Freq: Every day | ORAL | 0 refills | Status: AC

## 2023-01-19 MED ORDER — LOSARTAN POTASSIUM 25 MG PO TABS: 25.0000 mg | ORAL_TABLET | Freq: Every day | ORAL | 2 refills | Status: DC

## 2023-01-19 MED ORDER — TAMSULOSIN HCL 0.4 MG PO CAPS: 0.4000 mg | ORAL_CAPSULE | Freq: Every day | ORAL | 2 refills | Status: DC

## 2023-01-19 MED ORDER — POLYSACCHARIDE IRON COMPLEX 150 MG PO CAPS: 150.0000 mg | ORAL_CAPSULE | Freq: Every day | ORAL | 0 refills | Status: DC

## 2023-01-19 NOTE — Progress Notes (Signed)
Patient ID: Manuel [name], male   DOB: [birth year], 77 y.o.   MRN: [id number] [facility] Card[facility]    SUBJECTIVE: Patient states she feels somewhat better was able to sleep last night no groin pain no chest pain resting comfortably feels reasonably well is ready to go home   Vitals:   01/19/23 0542 01/19/23 0640 01/19/23 0811 01/19/23 1158  BP: (!) 158/77  (!) 157/75 (!) 139/54  Pulse: 73  74 (!) 45  Resp: 20  19 19   Temp: 98.7 F (37.1 C)  99.6 F (37.6 C) 98.4 F (36.9 C)  TempSrc: Oral   Oral  SpO2: 95%  96% 94%  Weight:  84.6 kg    Height:         Intake/Output Summary (Last 24 hours) at 01/19/2023 1324 Last data filed at 01/19/2023 0500 Gross per 24 hour  Intake 240 ml  Output 300 ml  Net -60 ml      PHYSICAL EXAM  General: Well developed, well nourished, in no acute distress HEENT:  Normocephalic and atramatic Neck:  No JVD.  Lungs: Clear bilaterally to auscultation and percussion. Heart: HRRR . Normal S1 and S2 without gallops or murmurs.  Abdomen: Bowel sounds are positive, abdomen soft and non-tender  Msk:  Back normal, normal gait. Normal strength and tone for age. Extremities: No clubbing, cyanosis or edema.   Neuro: Alert and oriented X 3. Psych:  Good affect, responds appropriately   LABS: Basic Metabolic Panel: Recent Labs    01/18/23 0457 01/19/23 0551  NA 133* 133*  K 4.0 3.5  CL 97* 98  CO2 28 25  GLUCOSE 200* 133*  BUN 31* 26*  CREATININE 0.88 0.88  CALCIUM 8.8* 8.7*  MG 2.2 2.0  PHOS 4.1 4.0   Liver Function Tests: No results for input(s): "AST", "ALT", "ALKPHOS", "BILITOT", "PROT", "ALBUMIN" in the last 72 hours. No results for input(s): "LIPASE", "AMYLASE" in the last 72 hours. CBC: Recent Labs    01/18/23 0457 01/19/23 0551  WBC 10.8* 10.6*  HGB 11.6* 11.0*  HCT 34.0* 32.8*  MCV 82.7 83.0  PLT 372 353   Cardiac Enzymes: No results for input(s): "CKTOTAL", "CKMB", "CKMBINDEX", "TROPONINI" in the last 72 hours. BNP: Invalid  input(s): "POCBNP" D-Dimer: No results for input(s): "DDIMER" in the last 72 hours. Hemoglobin A1C: No results for input(s): "HGBA1C" in the last 72 hours. Fasting Lipid Panel: No results for input(s): "CHOL", "HDL", "LDLCALC", "TRIG", "CHOLHDL", "LDLDIRECT" in the last 72 hours. Thyroid Function Tests: No results for input(s): "TSH", "T4TOTAL", "T3FREE", "THYROIDAB" in the last 72 hours.  Invalid input(s): "FREET3" Anemia Panel: No results for input(s): "VITAMINB12", "FOLATE", "FERRITIN", "TIBC", "IRON", "RETICCTPCT" in the last 72 hours.  No results found.   Echo failure.  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

## 2023-01-19 NOTE — Discharge Summary (Signed)
[facility] Discharge Summary   Patient: Manuel [name] WNU:[id number]  PCP: Gracelyn Nurse, MD  Date of admission: 01/15/2023   Date of discharge:  01/19/2023     Discharge Diagnoses:  Principal Problem:   NSTEMI (non-ST elevated myocardial infarction) [facility]) Active Problems:   Ischemic ulcer of lower extremity (HCC)   Diabetic polyneuropathy associated with type 2 diabetes mellitus (HCC)   Hypertension   Diabetes mellitus type 2, insulin dependent (HCC)   Alcohol abuse   Mixed hyperlipidemia   Admitted From: Home Disposition:  Home   Recommendations for Outpatient Follow-up:  Follow-up with PCP in 1 week, continue monitor BP at home and follow with PCP to titrate medications accordingly. Follow-up with cardiology in 1 to 2 weeks for medical management of CHF Follow-up with vascular surgery as per schedule, continue wound VAC change 2-3 times in a week and follow vascular surgery for further management as an outpatient. Follow up LABS/TEST: CBC and BMP after 1 week   Follow-up Information     Callwood, Dwayne D, MD. Go in 1 week(s).   Specialties: Cardiology, Internal Medicine Why: Also scheduled to see heart failure clinic at [facility] cardiology on 10/1 at 3 PM Contact information: [address] [place] [address] [phone]                Diet recommendation: Cardiac and Carb modified diet  Activity: The patient is advised to gradually reintroduce usual activities, as tolerated  Discharge Condition: stable  Code Status: Limited code   History of present illness: As per the H and P dictated on admission  Hospital Course:  Manuel [name] is a 77 y.o. male with medical history significant of HFrEF, insulin-dependent diabetes, hypertension, CAD, PAD status post angioplasty with stent 2023 as well as recent admission for lower limb ischemia with multiple vascular procedures July through August 2024 presenting with acute on chronic HFrEF.  Patient  reports worsening shortness of breath, increased work of breathing over the past 4 to 5 days.  Does also report neuropathic pain with symptoms being poorly controlled with Neurontin.  Positive orthopnea, PND.  Does report?  Partial compliance with diuretic regimen.  No reported high salt or NSAID intake.  Prior alcohol use.  No reported active alcohol use at present.  No abdominal pain.  No diar white count 14, hemoglobin 11.7, platelets 345, troponin 430s to 470s.  Creatinine 0.93.  Glucose 180s.  BNP is 1760.  Chest x-ray positive cardiomegaly.  Rhea.  Recently had I&D done with vascular surgery September 19.  Wound VAC has been stable. Presented to the ER afebrile, hemodynamically stable.  Satting well on room air.     Assessment and Plan:   # Acute on chronic combined systolic and diastolic CHF (congestive heart failure)  2D echo July 2024 with EF of 20 to 25% Acute decompensated HFrEF with increased work of breathing, orthopnea, PND as well as lower extremity swelling over the past few days. BNP 1700 with noted cardiomegaly on chest x-ray S/p IV Lasix in the ER, s/p diuresis as well as heart failure protocol Cardiology consulted, started losartan 20 mg p.o. daily, Farxiga 10 mg p.o. daily, IV Lasix switched to torsemide oral.  Continue spironolactone 50 mg p.o. daily, Coreg 25 mg p.o. twice daily.  Orthopnea resolved, patient is breathing well and sleeping well, lower extremity edema has improved.  Patient was discharged home to follow-up with cardiology as an outpatient.   NSTEMI (non-ST elevated myocardial infarction)  Troponin  into the 400s in the setting of active decompensated heart failure EKG stable. No active chest pain. Suspect secondary demand ischemia in setting of heart failure exacerbation. S/p heparin drip, DC'd on 9/26 as per cardiology no need. Continued aspirin and Plavix.  Patient was seen by cardiology and cleared for discharge to follow-up as an outpatient.  No ischemic workup  needed at this time.   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 Recent wound debridement by vascular surgery September 19 Wound VAC in place, it was leaking air so it was fixed and dressing appears to be stable-CDI  S/p Hparin drip, discontinued as per cardiology. Continue DAPT and statin. Vascular surgery consulted. Wound care RN consulted for wound VAC management.  Wound VAC was changed on 9/28 as it was causing pain.  Now it is working well, patient denies any pain at the wound VAC area.  Slept well and pain is under control, cleared for discharge to follow-up as an outpatient.   # Diabetic polyneuropathy associated with type 2 diabetes mellitus Patient with worsening pain despite oral Neurontin regimen Continue oxycodone as needed, On 9/27 started amitriptyline 25 mg p.o. nightly for neuropathy as well as for depression.  Pain is well-controlled now, wound VAC was changed.  Follow-up as an outpatient.  # Hypertension: Continue Coreg 25 mg p.o. twice daily, continue spironolactone 50 mg p.o. daily, started losartan 25 mg p.o. daily and torsemide 20 mg p.o. daily.  Monitor BP at home and follow with PCP to titrate medications accordingly. # Diabetes mellitus type 2, insulin dependent: Resumed home regimen on discharge.  Advised to monitor CBG at home and continue diabetic diet. # Alcohol abuse: Noted prior hx/o ETOH use. No reported active ETOH use at present  # Mixed hyperlipidemia, continue statin  # Iron deficiency anemia: Started oral iron supplement with vitamin C Follow-up with PCP to repeat iron  profile after 3 to 6 months Folic acid and B12 levels within normal range Generalized weakness, vitamin D within normal range Difficulty urinating, possible BPH, Started Flomax 0.4 mg p.o. nightly  Body mass index is 28.36 kg/m.  Nutrition Interventions:  - Patient was instructed, not to drive, operate heavy machinery, perform activities at heights, swimming or participation in water activities or provide baby sitting services while on Pain, Sleep and Anxiety Medications; until his outpatient Physician has advised to do so again.  - Also recommended to not to take more than prescribed Pain, Sleep and Anxiety Medications.  Patient was ambulatory without any assistance. On the day of the discharge the patient's vitals were stable, and no other acute medical condition were reported by patient. the patient was felt safe to be discharge at Home.  Consultants: Cardiologist, vascular surgery, wound care RN Procedures: Wound VAC change  Discharge Exam: General: Appear in no distress, no Rash; Oral Mucosa Clear, moist. Cardiovascular: S1 and S2 Present, no Murmur, Respiratory: normal respiratory effort, Bilateral Air entry present and no Crackles, no wheezes Abdomen: Bowel Sound present, Soft and no tenderness, no hernia Extremities: 2+  Pedal edema, no calf tenderness, right groin wound VAC attached Neurology: alert and oriented to time, place, and person affect appropriate.  Filed Weights   01/16/23 0011 01/16/23 0500 01/19/23 0640  Weight: 90 kg 90 kg 84.6 kg   Vitals:   01/19/23 0542 01/19/23 0811  BP: (!) 158/77 (!) 157/75  Pulse: 73 74  Resp: 20 19  Temp: 98.7 F (37.1 C) 99.6 F (37.6 C)  SpO2: 95% 96%    DISCHARGE MEDICATION: Allergies as of 01/19/2023   No Known Allergies      Medication List     STOP taking these medications    nystatin powder Commonly known as: MYCOSTATIN/NYSTOP       TAKE these medications    amitriptyline 25 MG tablet Commonly known  as: ELAVIL Take 1 tablet (25 mg total) by mouth at bedtime.   ascorbic acid 500 MG tablet Commonly known as: VITAMIN C Take 1 tablet (500 mg total) by mouth daily. Start taking on: January 20, 2023   aspirin EC 81 MG tablet Take 1 tablet (81 mg total) by mouth daily.   atorvastatin 80 MG tablet Commonly known as: LIPITOR Take 1 tablet (80 mg total) by mouth daily. What changed: when to take this   carvedilol 25 MG tablet Commonly known as: COREG Take 25 mg by mouth 2 (two) times daily with a meal.   clopidogrel 75 MG tablet Commonly known as: PLAVIX Take 1 tablet (75 mg total) by mouth daily.   dapagliflozin propanediol 10 MG Tabs tablet Commonly known as: FARXIGA Take 1 tablet (10 mg total) by mouth daily. Start taking on: January 20, 2023   Ensure Max Protein Liqd Take 330 mLs (11 oz total) by mouth 2 (two) times daily. What changed: when to take this   fluticasone 50 MCG/ACT nasal spray Commonly known as: FLONASE Place 1 spray into both nostrils daily as needed for allergies or rhinitis.   gabapentin 400 MG capsule Commonly known as: NEURONTIN Take 800 mg by mouth 2 (two) times daily.   insulin aspart protamine- aspart (70-30) 100 UNIT/ML injection Commonly known as: NOVOLOG MIX 70/30 Inject 30 Units into the skin 2 (two) times daily with a meal.   iron polysaccharides 150 MG capsule Commonly known as: NIFEREX Take 1 capsule (150 mg total) by mouth daily. Start taking on: January 20, 2023   levocetirizine 5 MG tablet Commonly known as: XYZAL Take 1 tablet (5 mg total) by mouth at bedtime as needed for allergies. What changed:  when to take this reasons to take this   losartan 25 MG tablet Commonly known as: COZAAR Take 1 tablet (25 mg total) by mouth daily. Start taking on: January 20, 2023   senna-docusate 8.6-50 MG tablet Commonly known as: Senokot-S Take 1 tablet by mouth at bedtime as needed for mild constipation.   spironolactone 50 MG  tablet Commonly known as: ALDACTONE Take 1 tablet (50 mg total) by mouth daily.   tamsulosin 0.4 MG Caps capsule Commonly known as: FLOMAX Take 1 capsule (0.4 mg total) by mouth daily after supper.   torsemide 20 MG tablet Commonly known as: DEMADEX Take 1 tablet (20 mg total) by mouth daily. Start taking on: January 20, 2023               Discharge Care Instructions  (From admission, onward)           Start     Ordered   01/19/23 0000  Discharge wound care:       Comments: Wound VAC and dressing change as per vascular surgery   01/19/23 1144  No Known Allergies Discharge Instructions     Call MD for:  difficulty breathing, headache or visual disturbances   Complete by: As directed    Call MD for:  extreme fatigue   Complete by: As directed    Call MD for:  persistant dizziness or light-headedness   Complete by: As directed    Call MD for:  persistant nausea and vomiting   Complete by: As directed    Call MD for:  severe uncontrolled pain   Complete by: As directed    Call MD for:  temperature >100.4   Complete by: As directed    Diet Carb Modified   Complete by: As directed    Discharge instructions   Complete by: As directed    Follow-up with PCP in 1 week, continue monitor BP at home and follow with PCP to titrate medications accordingly. Follow-up with cardiology in 1 to 2 weeks for medical management of CHF Follow-up with vascular surgery as per schedule, continue wound VAC change 2-3 times in a week and follow vascular surgery for further management as an outpatient.   Discharge wound care:   Complete by: As directed    Wound VAC and dressing change as per vascular surgery   Increase activity slowly   Complete by: As directed        The results of significant diagnostics from this hospitalization (including imaging, microbiology, ancillary and laboratory) are listed below for reference.    Significant Diagnostic Studies: DG  Chest Portable 1 View  Result Date: 01/15/2023 CLINICAL DATA:  Chest pain EXAM: PORTABLE CHEST 1 VIEW COMPARISON:  11/12/2022 FINDINGS: Low volume chest with cardiomegaly and congested appearance of vessels. There is no edema, consolidation, effusion, or pneumothorax. Remote right rib fractures. Bilateral shoulder degeneration with chronic cystic change on both sides of the right glenohumeral joint. IMPRESSION: Low volume chest with cardiomegaly and mild vascular congestion. Electronically Signed   By: Tiburcio Pea M.D.   On: 01/15/2023 07:39   VAS [place] LOWER EXTREMITY VENOUS (DVT)  Result Date: 01/06/2023  Lower Venous DVT Study Patient Name:  PAGE LANCON  Date of Exam:   01/01/2023 Medical Rec #: [id number]         Accession #:    [id number] Date of Birth: 09/21/1945         Patient Gender: M Patient Age:   64 years Exam Location:  [facility] Procedure:      VAS [place] LOWER EXTREMITY VENOUS (DVT) Referring Phys: Sheppard Plumber --------------------------------------------------------------------------------  Indications: Pain, Swelling, and Edema.  Risk Factors: Surgery Bilateral femoral endarterectomies 11/15/2022. Performing Technologist: Hardie Lora RVT  Examination Guidelines: A complete evaluation includes B-mode imaging, spectral Doppler, color Doppler, and power Doppler as needed of all accessible portions of each vessel. Bilateral testing is considered an integral part of a complete examination. Limited examinations for reoccurring indications may be performed as noted. The reflux portion of the exam is performed with the patient in reverse Trendelenburg.  +---------+---------------+---------+-----------+----------+--------------+ RIGHT    CompressibilityPhasicitySpontaneityPropertiesThrombus Aging +---------+---------------+---------+-----------+----------+--------------+ CFV      Full           Yes      Yes                                  +---------+---------------+---------+-----------+----------+--------------+ SFJ      Full           Yes  Yes                                 +---------+---------------+---------+-----------+----------+--------------+ FV Prox  Full           Yes      Yes                                 +---------+---------------+---------+-----------+----------+--------------+ FV Mid   Full           Yes      Yes                                 +---------+---------------+---------+-----------+----------+--------------+ FV DistalFull           Yes      Yes                                 +---------+---------------+---------+-----------+----------+--------------+ PFV      Full           Yes      Yes                                 +---------+---------------+---------+-----------+----------+--------------+ POP      Full           Yes      Yes                                 +---------+---------------+---------+-----------+----------+--------------+ PTV      Full                                                        +---------+---------------+---------+-----------+----------+--------------+ GSV      Full                                                        +---------+---------------+---------+-----------+----------+--------------+ SSV      Full                                                        +---------+---------------+---------+-----------+----------+--------------+ Irregular anechoic area adjacent to CFV consistent with hematoma. SFA stent is patent.  +----+---------------+---------+-----------+----------+--------------+ LEFTCompressibilityPhasicitySpontaneityPropertiesThrombus Aging +----+---------------+---------+-----------+----------+--------------+ CFV Full           Yes      Yes                                 +----+---------------+---------+-----------+----------+--------------+ Irregular anechoic area adjacent to CFV consistent with  hematoma.    Summary: RIGHT: - There is no evidence of deep vein thrombosis in the lower extremity. - There is no  evidence of superficial venous thrombosis.  - No cystic structure found in the popliteal fossa. - Hematoma seen in right groin.  LEFT: - No evidence of common femoral vein obstruction.  - Hematoma seen in left groin.  *See table(s) above for measurements and observations. Electronically signed by Levora Dredge MD on 01/06/2023 at 12:24:09 PM.    Final    VAS [place] ABI WITH/WO TBI  Result Date: 01/02/2023  LOWER EXTREMITY DOPPLER STUDY Patient Name:  JASYAH THEURER  Date of Exam:   01/01/2023 Medical Rec #: [id number]         Accession #:    [id number] Date of Birth: 1945-12-19         Patient Gender: M Patient Age:   4 years Exam Location:  [facility] Procedure:      VAS [place] ABI WITH/WO TBI Referring Phys: Barbara Cower DEW --------------------------------------------------------------------------------  Indications: Peripheral artery disease. High Risk Factors: Hypertension, hyperlipidemia, Diabetes, no history of                    smoking. Other Factors: Right leg swelling since the procedure.  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 PTA Left CIA. PTA Right Peroneal                         Artery Tibioperoneal Trunk. PTA of the Right Distal SFA.                         Stent placement to the Right SFA Distal segment and                         Proximal Popliteal Artery. Performing Technologist: Hardie Lora RVT  Examination Guidelines: A complete evaluation includes at minimum, Doppler waveform signals and systolic blood pressure reading at the level of bilateral brachial, anterior tibial, and posterior tibial arteries, when vessel  segments are accessible. Bilateral testing is considered an integral part of a complete examination. Photoelectric Plethysmograph (PPG) waveforms and toe systolic pressure readings are included as required and additional duplex testing as needed. Limited examinations for reoccurring indications may be performed as noted.  ABI Findings: +---------+------------------+-----+----------+--------+ Right    Rt Pressure (mmHg)IndexWaveform  Comment  +---------+------------------+-----+----------+--------+ Brachial 163                                       +---------+------------------+-----+----------+--------+ PTA      110               0.67 monophasic         +---------+------------------+-----+----------+--------+ DP       89  0.54 monophasic         +---------+------------------+-----+----------+--------+ Great Toe55                0.33                    +---------+------------------+-----+----------+--------+ +---------+------------------+-----+----------+-------+ Left     Lt Pressure (mmHg)IndexWaveform  Comment +---------+------------------+-----+----------+-------+ Brachial 165                                      +---------+------------------+-----+----------+-------+ PTA      136               0.82 monophasic        +---------+------------------+-----+----------+-------+ DP       131               0.79 biphasic          +---------+------------------+-----+----------+-------+ Penelope Coop                0.54                   +---------+------------------+-----+----------+-------+ +-------+-----------+-----------+------------+------------+ ABI/TBIToday's ABIToday's TBIPrevious ABIPrevious TBI +-------+-----------+-----------+------------+------------+ Right  0.67       0.33       0.81        0.64         +-------+-----------+-----------+------------+------------+ Left   0.82       0.54       0.90        0.72          +-------+-----------+-----------+------------+------------+ Right ABIs appear decreased compared to prior study on 06/07/2022. Left ABIs appear essentially unchanged compared to prior study on 06/07/2022. Limited imaging of the right leg showed compressible femoral vein and popliteal vein.  Summary: Right: Resting right ankle-brachial index indicates moderate right lower extremity arterial disease. The right toe-brachial index is abnormal. Left: Resting left ankle-brachial index indicates mild left lower extremity arterial disease. The left toe-brachial index is abnormal. *See table(s) above for measurements and observations.  Electronically signed by Festus Barren MD on 01/02/2023 at 2:39:54 PM.    Final     Microbiology: No results found for this or any previous visit (from the past 240 hour(s)).   Labs: CBC: Recent Labs  Lab 01/15/23 0754 01/16/23 0151 01/17/23 0451 01/18/23 0457 01/19/23 0551  WBC 14.0* 13.5* 10.7* 10.8* 10.6*  NEUTROABS 11.7*  --   --   --   --   HGB 11.7* 11.1* 10.4* 11.6* 11.0*  HCT 35.7* 32.9* 30.5* 34.0* 32.8*  MCV 85.0 84.6 81.8 82.7 83.0  PLT 345 292 301 372 353   Basic Metabolic Panel: Recent Labs  Lab 01/15/23 0754 01/16/23 0151 01/17/23 0451 01/18/23 0457 01/19/23 0551  NA 129* 128* 130* 133* 133*  K 4.5 4.2 3.8 4.0 3.5  CL 97* 94* 95* 97* 98  CO2 23 24 24 28 25   GLUCOSE 187* 176* 157* 200* 133*  BUN 23 29* 34* 31* 26*  CREATININE 0.93 0.98 0.85 0.88 0.88  CALCIUM 8.5* 8.3* 8.4* 8.8* 8.7*  MG  --  1.9 2.0 2.2 2.0  PHOS  --   --  3.5 4.1 4.0   Liver Function Tests: Recent Labs  Lab 01/15/23 0754 01/16/23 0151  AST 18 22  ALT 12 17  ALKPHOS 59 61  BILITOT 1.2 0.9  PROT 6.5 6.6  ALBUMIN 3.1* 2.9*   No results for input(s): "  LIPASE", "AMYLASE" in the last 168 hours. No results for input(s): "AMMONIA" in the last 168 hours. Cardiac Enzymes: No results for input(s): "CKTOTAL", "CKMB", "CKMBINDEX", "TROPONINI" in the last 168 hours. BNP (last  3 results) Recent Labs    11/13/22 0451 01/15/23 0754  BNP 932.6* 1,760.5*   CBG: Recent Labs  Lab 01/18/23 0815 01/18/23 1225 01/18/23 1721 01/18/23 2208 01/19/23 0757  GLUCAP 190* 160* 149* 147* 125*    Time spent: 35 minutes  Signed:  Gillis Santa  [facility] 01/19/2023 11:44 AM

## 2023-01-20 ENCOUNTER — Telehealth (INDEPENDENT_AMBULATORY_CARE_PROVIDER_SITE_OTHER): Payer: Self-pay

## 2023-01-20 NOTE — Telephone Encounter (Signed)
Tiffany called for verbal orders for [facility]. She stated he was discharged without resumption or care orders. She wants to know the [place] pressure and how many times a week should it be changed.  Please advise

## 2023-01-21 ENCOUNTER — Telehealth (INDEPENDENT_AMBULATORY_CARE_PROVIDER_SITE_OTHER): Payer: Self-pay

## 2023-01-21 NOTE — Telephone Encounter (Signed)
He needs to contact his cardiologist or PCP as we do not prescribe spironolactone

## 2023-01-21 NOTE — Telephone Encounter (Signed)
Left a message on [employer]

## 2023-01-21 NOTE — Telephone Encounter (Signed)
Pressure is 125 and should be changed at least 2 times a week

## 2023-01-22 ENCOUNTER — Other Ambulatory Visit (INDEPENDENT_AMBULATORY_CARE_PROVIDER_SITE_OTHER): Payer: Self-pay | Admitting: Nurse Practitioner

## 2023-01-22 ENCOUNTER — Telehealth (INDEPENDENT_AMBULATORY_CARE_PROVIDER_SITE_OTHER): Payer: Self-pay

## 2023-01-22 MED ORDER — HYDROCODONE-ACETAMINOPHEN 5-325 MG PO TABS: 1.0000 | ORAL_TABLET | Freq: Four times a day (QID) | ORAL | 0 refills | Status: DC | PRN

## 2023-01-22 NOTE — Telephone Encounter (Signed)
Pain meds sent

## 2023-01-22 NOTE — Telephone Encounter (Signed)
I spoke with pt's wife and let her know that we do not prescribed this drug and she should reach out to pt's PCP or Cardiologist and she states understanding

## 2023-01-22 NOTE — Telephone Encounter (Signed)
Patient spouse left a message requesting pain medication for her husband to help for pain relief. Please Advise

## 2023-01-22 NOTE — Telephone Encounter (Signed)
Patient notified that pain medication has been sent to pharmacy and verbalized understanding

## 2023-01-23 NOTE — Progress Notes (Deleted)
Advanced Heart Failure Clinic Note   Referring Physician: PCP: Gracelyn Nurse, MD Cardiologist: None   HPI:  [name] is a 77 y/o male with a history of  Admitted 11/10/22 due to right lower extremity ischemia and ulcer formation with gangrenous toe. Plain films of the right foot with no signs of osteomyelitis. Dr. [name] with vascular surgery initially consulted with recommendation for heparin drip. Dr. [name] with podiatry also consulted. Lower extremity angiography done. IV lasix given after CXR done. Echocardiogram with EF of 25 to 30% with some regional wall motion abnormalities. Underwent extensive vascular procedure which involved bilateral lower extremity endarterectomies and multiple stents placement. Pulses present with Doppler. Significant blood loss during the procedure.  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  Hemoglobin decreased to 7.8 postsurgically. Going home with 2 wound VAC in place in his groin areas. IV lasix had been stopped due to worsening renal function but then resumed due to being volume up. Urology consulted for scrotal edema. Symptoms improved.      Admitted 01/15/23 due to worsening shortness of breath, increased work of breathing over the past 4 to 5 days. Does also report neuropathic pain with symptoms being poorly controlled with Neurontin. Positive orthopnea, PND. BNP is 1760. Chest x-ray positive cardiomegaly. Troponin 430s to 470s  Recently had I&D done with vascular surgery September 19. Wound VAC has been stable. Cardiology consulted. IV lasix given. Troponin into the 400s in the setting of active decompensated heart failure EKG stable. No active chest pain. Suspect secondary demand ischemia.   Echo 04/20/14: EF > 55% with Grade I DD, mild MR Echo 11/10/18: EF 45% with Grade I DD, mild/moderate MR Echo 11/14/22: EF 25-30% with mild/ moderate MR/ TR  Stress test 11/10/18: Normal Lexiscan infusion EKG  Mild segmental LV systolic dysfunction with  inferior hypokinesis  Fixed and minimal reversible inferior myocardial perfusion defect consistent with mainly previous infarct with peri-infarct myocardial ischemia   He presents today for his initial visit with a chief complaint of    Review of Systems: [y] = yes, [ ]  = no   General: Weight gain [ ] ; Weight loss [ ] ; Anorexia [ ] ; Fatigue [ ] ; Fever [ ] ; Chills [ ] ; Weakness [ ]   Cardiac: Chest pain/pressure [ ] ; Resting SOB [ ] ; Exertional SOB [ ] ; Orthopnea [ ] ; Pedal Edema [ ] ; Palpitations [ ] ; Syncope [ ] ; Presyncope [ ] ; Paroxysmal nocturnal dyspnea[ ]   Pulmonary: Cough [ ] ; Wheezing[ ] ; Hemoptysis[ ] ; Sputum [ ] ; Snoring [ ]   GI: Vomiting[ ] ; Dysphagia[ ] ; Melena[ ] ; Hematochezia [ ] ; Heartburn[ ] ; Abdominal pain [ ] ; Constipation [ ] ; Diarrhea [ ] ; BRBPR [ ]   GU: Hematuria[ ] ; Dysuria [ ] ; Nocturia[ ]   Vascular: Pain in legs with walking [ ] ; Pain in feet with lying flat [ ] ; Non-healing sores [ ] ; Stroke [ ] ; TIA [ ] ; Slurred speech [ ] ;  Neuro: Headaches[ ] ; Vertigo[ ] ; Seizures[ ] ; Paresthesias[ ] ;Blurred vision [ ] ; Diplopia [ ] ; Vision changes [ ]   Ortho/Skin: Arthritis [ ] ; Joint pain [ ] ; Muscle pain [ ] ; Joint swelling [ ] ; Back Pain [ ] ; Rash [ ]   Psych: Depression[ ] ; Anxiety[ ]   Heme: Bleeding problems [ ] ; Clotting disorders [ ] ; Anemia [ ]   Endocrine: Diabetes [ ] ; Thyroid dysfunction[ ]    Past Medical History:  Diagnosis Date   Acute ST elevation myocardial infarction (STEMI) of inferior wall (HCC) 04/19/2010   a.) transfered from  [facility] to [facility] --> LHC/PCI (very difficult procedure) --> 3.0 x 23 mm and 3.0 x 12 mm Xience stents to RCA   Allergies    Arthritis    Benign essential hypertension    Bilateral carotid artery disease (HCC) 05/08/2021   a.) carotid doppler 05/08/2021: 1-39% BICA   CAD (coronary artery disease) 04/19/2010   a.) inferior STEMI 04/19/2010 --> LHC/PCI: 50-70% pD1, 80% pRI, 90/90/90% RCA (overlapping 3.0 x 23 and 3.0 x 12 mm Xience DES); b.)  MV 11/10/2018: fixed minimally reversible inferior perfusion defect   Cellulitis of foot    DDD (degenerative disc disease), cervical    Diabetes mellitus type 2, insulin dependent (HCC)    Diverticulosis    Full dentures    Gout    Hard of hearing    History of bilateral cataract extraction 2022   History of ETOH abuse    Hyperlipidemia    Ischemic cardiomyopathy 04/19/2010   a.) TTE 04/19/2010: 40%; b.) TTE 04/20/2014: EF >55%, mild RVE, triv PR, mild MR/TR, G1DD; c.) TTE 11/10/2018: EF 45%, inf HK, mild RVE, triv TR/PR, mild MR, G1DD; d.) TTE 11/14/2022: EF 25-30%, basal anteroseptal, apical lateral, apical septal, and apex AK, mid anterolateral HK, mild-mod MR/TR   Long term current use of aspirin    Long term current use of clopidogrel    Lumbar degenerative disc disease    Lumbar radiculopathy    Lumbar vertebral fracture (chronic superior endplate of L1)    OSA (obstructive sleep apnea)    a.) unable to tolerate nocturnal PAP therapy   Peripheral artery disease (HCC)    a.) stenting 05/14/21: 12 mm x 12 cm LifeStent RIGHT dis SFA/prox pop; b.) s/p cath directed thrombolysis RIGHT SFA/pop 06/14/21; c.) s/p mech thrombectomy + stenting 06/15/21: 8 mm x 25cm & 8 mm x 7.5cm Viabahn; d.) s/p BILAT CFA, profunda femoris, SFA endarterectomies + fogarty embolectomy + stenting 11/15/22: 12mm x 58mm Lifestream BILAT CIAs, 14 mm x 6 cm Lifestream & 13 mm x 5 cm Viabahn LEFT EIA   Peripheral neuropathy    Umbilical hernia     Current Outpatient Medications  Medication Sig Dispense Refill   amitriptyline (ELAVIL) 25 MG tablet Take 1 tablet (25 mg total) by mouth at bedtime. 30 tablet 2   ascorbic acid (VITAMIN C) 500 MG tablet Take 1 tablet (500 mg total) by mouth daily. 30 tablet 0   aspirin EC 81 MG tablet Take 1 tablet (81 mg total) by mouth daily. 150 tablet 2   atorvastatin (LIPITOR) 80 MG tablet Take 1 tablet (80 mg total) by mouth daily. (Patient taking differently: Take 80 mg by mouth at  bedtime.) 90 tablet 1   carvedilol (COREG) 25 MG tablet Take 25 mg by mouth 2 (two) times daily with a meal.     clopidogrel (PLAVIX) 75 MG tablet Take 1 tablet (75 mg total) by mouth daily. 90 tablet 1   dapagliflozin propanediol (FARXIGA) 10 MG TABS tablet Take 1 tablet (10 mg total) by mouth daily. 30 tablet 2   Ensure Max Protein (ENSURE MAX PROTEIN) LIQD Take 330 mLs (11 oz total) by mouth 2 (two) times daily. (Patient taking differently: Take 11 oz by mouth daily.) 10000 mL 3   fluticasone (FLONASE) 50 MCG/ACT nasal spray Place 1 spray into both nostrils daily as needed for allergies or rhinitis.     gabapentin (NEURONTIN) 400 MG capsule Take 800 mg by mouth 2 (two) times daily.     HYDROcodone-acetaminophen (  NORCO/VICODIN) 5-325 MG tablet Take 1 tablet by mouth every 6 (six) hours as needed for moderate pain. 20 tablet 0   insulin aspart protamine- aspart (NOVOLOG MIX 70/30) (70-30) 100 UNIT/ML injection Inject 30 Units into the skin 2 (two) times daily with a meal.     iron polysaccharides (NIFEREX) 150 MG capsule Take 1 capsule (150 mg total) by mouth daily. 90 capsule 0   levocetirizine (XYZAL) 5 MG tablet Take 1 tablet (5 mg total) by mouth at bedtime as needed for allergies.     losartan (COZAAR) 25 MG tablet Take 1 tablet (25 mg total) by mouth daily. 30 tablet 2   senna-docusate (SENOKOT-S) 8.6-50 MG tablet Take 1 tablet by mouth at bedtime as needed for mild constipation. 30 tablet 0   spironolactone (ALDACTONE) 50 MG tablet Take 1 tablet (50 mg total) by mouth daily. 30 tablet 0   tamsulosin (FLOMAX) 0.4 MG CAPS capsule Take 1 capsule (0.4 mg total) by mouth daily after supper. 30 capsule 2   torsemide (DEMADEX) 20 MG tablet Take 1 tablet (20 mg total) by mouth daily. 30 tablet 2   No current facility-administered medications for this visit.    No Known Allergies    Social History   Socioeconomic History   Marital status: Married    Spouse name: [name]   Number of children:  3   Years of education: Not on file   Highest education level: Not on file  Occupational History   Not on file  Tobacco Use   Smoking status: Never   Smokeless tobacco: Never  Vaping Use   Vaping status: Never Used  Substance and Sexual Activity   Alcohol use: Yes    Alcohol/week: 21.0 standard drinks of alcohol    Types: 7 Glasses of wine, 14 Cans of beer per week    Comment: occassional   Drug use: No   Sexual activity: Not on file  Other Topics Concern   Not on file  Social History Narrative   Not on file   Social Determinants of Health   Financial Resource Strain: Not on file  Food Insecurity: No Food Insecurity (01/16/2023)   Hunger Vital Sign    Worried About Running Out of Food in the Last Year: Never true    Ran Out of Food in the Last Year: Never true  Transportation Needs: No Transportation Needs (01/16/2023)   PRAPARE - Administrator, Civil Service (Medical): No    Lack of Transportation (Non-Medical): No  Physical Activity: Not on file  Stress: Not on file  Social Connections: Not on file  Intimate Partner Violence: Not At Risk (01/16/2023)   Humiliation, Afraid, Rape, and Kick questionnaire    Fear of Current or Ex-Partner: No    Emotionally Abused: No    Physically Abused: No    Sexually Abused: No      Family History  Problem Relation Age of Onset   Scoliosis Mother    Heart disease Father        PHYSICAL EXAM: General:  Well appearing. No respiratory difficulty HEENT: normal Neck: supple. no JVD. No lymphadenopathy or thyromegaly appreciated. Cor: PMI nondisplaced. Regular rate & rhythm. No rubs, gallops or murmurs. Lungs: clear Abdomen: soft, nontender, nondistended. No hepatosplenomegaly. No bruits or masses.  Extremities: no cyanosis, clubbing, rash, edema Neuro: alert & oriented x 3, cranial nerves grossly intact. moves all 4 extremities w/o difficulty. Affect pleasant.  ECG:   ASSESSMENT & PLAN:  1: Chronic  heart  failure with reduced ejection fraction- - suspect due to - NYHA class - euvolemic - weighing daily - Echo 04/20/14: EF > 55% with Grade I DD, mild MR - Echo 11/10/18: EF 45% with Grade I DD, mild/moderate MR - Echo 11/14/22: EF 25-30% with mild/ moderate MR/ TR - continue  - BNP  2: HTN- - BP - saw PCP - BMP  3: DM- - A1c  4: PVD- - saw vascular  5: NSTEM- - saw cardiology   Delma Freeze, FNP 01/23/23

## 2023-01-24 ENCOUNTER — Encounter: Payer: Medicare Other | Admitting: Family

## 2023-01-24 ENCOUNTER — Telehealth: Payer: Self-pay | Admitting: Family

## 2023-01-24 NOTE — Telephone Encounter (Signed)
Patient did not show for his initial Heart Failure Clinic appointment on 01/24/23.

## 2023-01-27 ENCOUNTER — Encounter: Payer: Self-pay | Admitting: Vascular Surgery

## 2023-01-28 ENCOUNTER — Telehealth (INDEPENDENT_AMBULATORY_CARE_PROVIDER_SITE_OTHER): Payer: Self-pay

## 2023-01-28 ENCOUNTER — Ambulatory Visit (INDEPENDENT_AMBULATORY_CARE_PROVIDER_SITE_OTHER): Payer: Medicare Other | Admitting: Nurse Practitioner

## 2023-01-28 NOTE — Telephone Encounter (Signed)
Pt called wanting to know when his appt was today?  I called back to let him know it was at 3 pm he also has an appt with cardiology today at 2:30 so he needed to cancel today's appt.  Pt is experiencing a lot of pain in his ankle and would like to know if he should do anything different since he can not be seen until 10/22. Please advise

## 2023-01-28 NOTE — Telephone Encounter (Signed)
Well we didn't do anything to his ankle so I'm not entirely certain why it hurts.  If it is because it is swollen, he can try to elevate his legs and wear compression.  To see if that helps.  He can take his pain medication as prescribed as well

## 2023-01-30 ENCOUNTER — Other Ambulatory Visit (INDEPENDENT_AMBULATORY_CARE_PROVIDER_SITE_OTHER): Payer: Self-pay | Admitting: Nurse Practitioner

## 2023-01-30 MED ORDER — OXYCODONE HCL 5 MG PO TABS
5.0000 mg | ORAL_TABLET | Freq: Four times a day (QID) | ORAL | 0 refills | Status: DC | PRN
Start: 2023-01-30 — End: 2023-02-19

## 2023-01-30 NOTE — Telephone Encounter (Signed)
Pt states the he is taking the hydrocodone and it is not touching the pain last night he took 1 and then 1 hour later he took another one and then 1 hour later he took another one.  He took a total of 3 hydrocodones before it touched the pain.  Pt states he has to have something to helped with the pain and be able to get some sleep.  He can not keep going without sleep.  The gabapentin he is eating like candy trying to get pain under control.

## 2023-01-30 NOTE — Telephone Encounter (Signed)
Sent in oxycodone but he needs to take as prescribed.  If this doesn't help his pain he may need to go to ED over the weekend or evaluation

## 2023-02-03 NOTE — Telephone Encounter (Signed)
Tried to call pt back for about 20 minutes and the line is staying busy will try to call again later.

## 2023-02-10 ENCOUNTER — Other Ambulatory Visit (INDEPENDENT_AMBULATORY_CARE_PROVIDER_SITE_OTHER): Payer: Self-pay | Admitting: Nurse Practitioner

## 2023-02-10 DIAGNOSIS — Z9889 Other specified postprocedural states: Secondary | ICD-10-CM

## 2023-02-11 ENCOUNTER — Ambulatory Visit (INDEPENDENT_AMBULATORY_CARE_PROVIDER_SITE_OTHER): Payer: Medicare Other | Admitting: Vascular Surgery

## 2023-02-11 ENCOUNTER — Encounter (INDEPENDENT_AMBULATORY_CARE_PROVIDER_SITE_OTHER): Payer: Medicare Other

## 2023-02-14 ENCOUNTER — Inpatient Hospital Stay: Payer: Medicare Other

## 2023-02-14 ENCOUNTER — Emergency Department: Payer: Medicare Other

## 2023-02-14 ENCOUNTER — Inpatient Hospital Stay
Admission: EM | Admit: 2023-02-14 | Discharge: 2023-02-19 | DRG: 617 | Disposition: A | Discharge status: Home Health | Payer: Medicare Other | Attending: Internal Medicine | Admitting: Internal Medicine

## 2023-02-14 ENCOUNTER — Ambulatory Visit (INDEPENDENT_AMBULATORY_CARE_PROVIDER_SITE_OTHER): Payer: Medicare Other | Admitting: Vascular Surgery

## 2023-02-14 ENCOUNTER — Encounter (INDEPENDENT_AMBULATORY_CARE_PROVIDER_SITE_OTHER): Payer: Self-pay | Admitting: Vascular Surgery

## 2023-02-14 VITALS — BP 140/80 | HR 80 | Resp 16 | Wt 179.4 lb

## 2023-02-14 DIAGNOSIS — E119 Type 2 diabetes mellitus without complications: Secondary | ICD-10-CM

## 2023-02-14 DIAGNOSIS — M109 Gout, unspecified: Secondary | ICD-10-CM | POA: Diagnosis present

## 2023-02-14 DIAGNOSIS — I255 Ischemic cardiomyopathy: Secondary | ICD-10-CM | POA: Diagnosis present

## 2023-02-14 DIAGNOSIS — G4733 Obstructive sleep apnea (adult) (pediatric): Secondary | ICD-10-CM | POA: Diagnosis present

## 2023-02-14 DIAGNOSIS — Z66 Do not resuscitate: Secondary | ICD-10-CM | POA: Diagnosis present

## 2023-02-14 DIAGNOSIS — H9193 Unspecified hearing loss, bilateral: Secondary | ICD-10-CM | POA: Diagnosis present

## 2023-02-14 DIAGNOSIS — I5042 Chronic combined systolic (congestive) and diastolic (congestive) heart failure: Secondary | ICD-10-CM | POA: Diagnosis present

## 2023-02-14 DIAGNOSIS — Z1611 Resistance to penicillins: Secondary | ICD-10-CM | POA: Diagnosis present

## 2023-02-14 DIAGNOSIS — L97509 Non-pressure chronic ulcer of other part of unspecified foot with unspecified severity: Secondary | ICD-10-CM | POA: Diagnosis present

## 2023-02-14 DIAGNOSIS — I5022 Chronic systolic (congestive) heart failure: Secondary | ICD-10-CM | POA: Diagnosis present

## 2023-02-14 DIAGNOSIS — K5900 Constipation, unspecified: Secondary | ICD-10-CM | POA: Diagnosis present

## 2023-02-14 DIAGNOSIS — Z7902 Long term (current) use of antithrombotics/antiplatelets: Secondary | ICD-10-CM

## 2023-02-14 DIAGNOSIS — I7025 Atherosclerosis of native arteries of other extremities with ulceration: Secondary | ICD-10-CM

## 2023-02-14 DIAGNOSIS — E1151 Type 2 diabetes mellitus with diabetic peripheral angiopathy without gangrene: Secondary | ICD-10-CM | POA: Diagnosis present

## 2023-02-14 DIAGNOSIS — I739 Peripheral vascular disease, unspecified: Secondary | ICD-10-CM | POA: Diagnosis not present

## 2023-02-14 DIAGNOSIS — G4700 Insomnia, unspecified: Secondary | ICD-10-CM | POA: Diagnosis present

## 2023-02-14 DIAGNOSIS — E1169 Type 2 diabetes mellitus with other specified complication: Principal | ICD-10-CM | POA: Diagnosis present

## 2023-02-14 DIAGNOSIS — I2510 Atherosclerotic heart disease of native coronary artery without angina pectoris: Secondary | ICD-10-CM | POA: Diagnosis present

## 2023-02-14 DIAGNOSIS — Z95828 Presence of other vascular implants and grafts: Secondary | ICD-10-CM

## 2023-02-14 DIAGNOSIS — B9689 Other specified bacterial agents as the cause of diseases classified elsewhere: Secondary | ICD-10-CM | POA: Diagnosis present

## 2023-02-14 DIAGNOSIS — E782 Mixed hyperlipidemia: Secondary | ICD-10-CM

## 2023-02-14 DIAGNOSIS — E11621 Type 2 diabetes mellitus with foot ulcer: Principal | ICD-10-CM | POA: Diagnosis present

## 2023-02-14 DIAGNOSIS — Z1152 Encounter for screening for COVID-19: Secondary | ICD-10-CM | POA: Diagnosis not present

## 2023-02-14 DIAGNOSIS — E785 Hyperlipidemia, unspecified: Secondary | ICD-10-CM | POA: Diagnosis present

## 2023-02-14 DIAGNOSIS — Z955 Presence of coronary angioplasty implant and graft: Secondary | ICD-10-CM

## 2023-02-14 DIAGNOSIS — L97519 Non-pressure chronic ulcer of other part of right foot with unspecified severity: Secondary | ICD-10-CM | POA: Diagnosis present

## 2023-02-14 DIAGNOSIS — I110 Hypertensive heart disease with heart failure: Secondary | ICD-10-CM | POA: Diagnosis present

## 2023-02-14 DIAGNOSIS — I10 Essential (primary) hypertension: Secondary | ICD-10-CM | POA: Diagnosis present

## 2023-02-14 DIAGNOSIS — F1010 Alcohol abuse, uncomplicated: Secondary | ICD-10-CM | POA: Diagnosis present

## 2023-02-14 DIAGNOSIS — Z89421 Acquired absence of other right toe(s): Secondary | ICD-10-CM

## 2023-02-14 DIAGNOSIS — Z8249 Family history of ischemic heart disease and other diseases of the circulatory system: Secondary | ICD-10-CM

## 2023-02-14 DIAGNOSIS — E1142 Type 2 diabetes mellitus with diabetic polyneuropathy: Secondary | ICD-10-CM

## 2023-02-14 DIAGNOSIS — I5023 Acute on chronic systolic (congestive) heart failure: Secondary | ICD-10-CM | POA: Diagnosis present

## 2023-02-14 DIAGNOSIS — M86171 Other acute osteomyelitis, right ankle and foot: Secondary | ICD-10-CM | POA: Diagnosis present

## 2023-02-14 DIAGNOSIS — Z79899 Other long term (current) drug therapy: Secondary | ICD-10-CM

## 2023-02-14 DIAGNOSIS — Z961 Presence of intraocular lens: Secondary | ICD-10-CM | POA: Diagnosis present

## 2023-02-14 DIAGNOSIS — Z951 Presence of aortocoronary bypass graft: Secondary | ICD-10-CM

## 2023-02-14 DIAGNOSIS — Z794 Long term (current) use of insulin: Secondary | ICD-10-CM

## 2023-02-14 DIAGNOSIS — I252 Old myocardial infarction: Secondary | ICD-10-CM | POA: Diagnosis not present

## 2023-02-14 DIAGNOSIS — E1165 Type 2 diabetes mellitus with hyperglycemia: Secondary | ICD-10-CM | POA: Diagnosis present

## 2023-02-14 DIAGNOSIS — I878 Other specified disorders of veins: Secondary | ICD-10-CM | POA: Diagnosis present

## 2023-02-14 DIAGNOSIS — R531 Weakness: Secondary | ICD-10-CM

## 2023-02-14 DIAGNOSIS — Z7982 Long term (current) use of aspirin: Secondary | ICD-10-CM

## 2023-02-14 LAB — URINALYSIS, ROUTINE W REFLEX MICROSCOPIC
Bacteria, UA: NONE SEEN
Bilirubin Urine: NEGATIVE
Glucose, UA: >=500 mg/dL — AB
Hgb urine dipstick: NEGATIVE
Ketones, ur: NEGATIVE mg/dL
Leukocytes,Ua: NEGATIVE
Nitrite: NEGATIVE
Protein, ur: 100 mg/dL — AB
Specific Gravity, Urine: 1.017 (ref 1.005–1.030)
pH: 5 (ref 5.0–8.0)

## 2023-02-14 LAB — SEDIMENTATION RATE: Sed Rate: 8 mm/h (ref 0–20)

## 2023-02-14 LAB — BASIC METABOLIC PANEL
Anion gap: 11 (ref 5–15)
BUN: 26 mg/dL — ABNORMAL HIGH (ref 8–23)
CO2: 27 mmol/L (ref 22–32)
Calcium: 9.1 mg/dL (ref 8.9–10.3)
Chloride: 95 mmol/L — ABNORMAL LOW (ref 98–111)
Creatinine, Ser: 0.96 mg/dL (ref 0.61–1.24)
GFR, Estimated: >60 mL/min (ref >60)
Glucose, Bld: 194 mg/dL — ABNORMAL HIGH (ref 70–99)
Potassium: 3.7 mmol/L (ref 3.5–5.1)
Sodium: 133 mmol/L — ABNORMAL LOW (ref 135–145)

## 2023-02-14 LAB — RESP PANEL BY RT-PCR (RSV, FLU A&B, COVID)  RVPGX2
Influenza A by PCR: NEGATIVE
Influenza B by PCR: NEGATIVE
Resp Syncytial Virus by PCR: NEGATIVE
SARS Coronavirus 2 by RT PCR: NEGATIVE

## 2023-02-14 LAB — CBC
HCT: 43.3 % (ref 39.0–52.0)
Hemoglobin: 14.4 g/dL (ref 13.0–17.0)
MCH: 27.1 pg (ref 26.0–34.0)
MCHC: 33.3 g/dL (ref 30.0–36.0)
MCV: 81.4 fL (ref 80.0–100.0)
Platelets: 303 10*3/uL (ref 150–400)
RBC: 5.32 MIL/uL (ref 4.22–5.81)
RDW: 14.1 % (ref 11.5–15.5)
WBC: 10.8 10*3/uL — ABNORMAL HIGH (ref 4.0–10.5)
nRBC: 0 % (ref 0.0–0.2)

## 2023-02-14 LAB — BRAIN NATRIURETIC PEPTIDE: B Natriuretic Peptide: 445.2 pg/mL — ABNORMAL HIGH (ref 0.0–100.0)

## 2023-02-14 MED ORDER — ACETAMINOPHEN 325 MG PO TABS
650.0000 mg | ORAL_TABLET | Freq: Four times a day (QID) | ORAL | Status: DC | PRN
  Administered 2023-02-15 – 2023-02-18 (×3): 650 mg via ORAL
  Filled 2023-02-14 (×3): qty 2

## 2023-02-14 MED ORDER — VITAMIN C 500 MG PO TABS
500.0000 mg | ORAL_TABLET | Freq: Every day | ORAL | Status: DC
  Administered 2023-02-15: 500 mg via ORAL
  Filled 2023-02-14: qty 1

## 2023-02-14 MED ORDER — ASPIRIN 81 MG PO TBEC
81.0000 mg | DELAYED_RELEASE_TABLET | Freq: Every day | ORAL | Status: DC
  Administered 2023-02-14 – 2023-02-19 (×6): 81 mg via ORAL
  Filled 2023-02-14 (×6): qty 1

## 2023-02-14 MED ORDER — CARVEDILOL 25 MG PO TABS
25.0000 mg | ORAL_TABLET | Freq: Two times a day (BID) | ORAL | Status: DC
  Administered 2023-02-15 – 2023-02-19 (×8): 25 mg via ORAL
  Filled 2023-02-14 (×9): qty 1

## 2023-02-14 MED ORDER — GABAPENTIN 400 MG PO CAPS
800.0000 mg | ORAL_CAPSULE | Freq: Two times a day (BID) | ORAL | Status: DC
  Administered 2023-02-14 – 2023-02-19 (×10): 800 mg via ORAL
  Filled 2023-02-14 (×10): qty 2

## 2023-02-14 MED ORDER — TORSEMIDE 20 MG PO TABS
20.0000 mg | ORAL_TABLET | Freq: Every day | ORAL | Status: DC
  Administered 2023-02-15 – 2023-02-19 (×5): 20 mg via ORAL
  Filled 2023-02-14 (×5): qty 1

## 2023-02-14 MED ORDER — HYDROMORPHONE HCL 1 MG/ML IJ SOLN
0.5000 mg | INTRAMUSCULAR | Status: DC | PRN
  Administered 2023-02-14: 0.5 mg via INTRAVENOUS
  Administered 2023-02-14 – 2023-02-19 (×8): 1 mg via INTRAVENOUS
  Filled 2023-02-14 (×10): qty 1

## 2023-02-14 MED ORDER — OXYCODONE HCL 5 MG PO TABS
5.0000 mg | ORAL_TABLET | ORAL | Status: DC | PRN
  Administered 2023-02-15 – 2023-02-18 (×3): 5 mg via ORAL
  Filled 2023-02-14 (×4): qty 1

## 2023-02-14 MED ORDER — SENNOSIDES-DOCUSATE SODIUM 8.6-50 MG PO TABS
1.0000 | ORAL_TABLET | Freq: Every day | ORAL | Status: DC
Start: 2023-02-14 — End: 2023-02-19
  Administered 2023-02-14 – 2023-02-18 (×5): 1 via ORAL
  Filled 2023-02-14 (×5): qty 1

## 2023-02-14 MED ORDER — PREGABALIN 75 MG PO CAPS: 75.0000 mg | ORAL_CAPSULE | Freq: Two times a day (BID) | ORAL | 3 refills | Status: DC

## 2023-02-14 MED ORDER — VANCOMYCIN HCL 1750 MG/350ML IV SOLN
1750.0000 mg | Freq: Once | INTRAVENOUS | Status: AC
  Administered 2023-02-14: 1750 mg via INTRAVENOUS
  Filled 2023-02-14: qty 350

## 2023-02-14 MED ORDER — POLYETHYLENE GLYCOL 3350 17 G PO PACK
17.0000 g | PACK | Freq: Every day | ORAL | Status: DC | PRN
Start: 2023-02-14 — End: 2023-02-19

## 2023-02-14 MED ORDER — THIAMINE MONONITRATE 100 MG PO TABS
100.0000 mg | ORAL_TABLET | Freq: Every day | ORAL | Status: DC
  Administered 2023-02-14 – 2023-02-19 (×6): 100 mg via ORAL
  Filled 2023-02-14 (×6): qty 1

## 2023-02-14 MED ORDER — ENSURE MAX PROTEIN PO LIQD
11.0000 [oz_av] | Freq: Every day | ORAL | Status: DC
  Administered 2023-02-15: 11 [oz_av] via ORAL
  Filled 2023-02-14: qty 330

## 2023-02-14 MED ORDER — INSULIN ASPART 100 UNIT/ML IJ SOLN
0.0000 [IU] | Freq: Three times a day (TID) | INTRAMUSCULAR | Status: DC
  Administered 2023-02-15: 3 [IU] via SUBCUTANEOUS
  Administered 2023-02-15 (×2): 5 [IU] via SUBCUTANEOUS
  Administered 2023-02-16: 2 [IU] via SUBCUTANEOUS
  Administered 2023-02-16: 3 [IU] via SUBCUTANEOUS
  Administered 2023-02-16: 5 [IU] via SUBCUTANEOUS
  Administered 2023-02-17: 3 [IU] via SUBCUTANEOUS
  Administered 2023-02-17: 5 [IU] via SUBCUTANEOUS
  Administered 2023-02-18: 8 [IU] via SUBCUTANEOUS
  Administered 2023-02-18: 2 [IU] via SUBCUTANEOUS
  Administered 2023-02-18: 11 [IU] via SUBCUTANEOUS
  Administered 2023-02-19 (×2): 5 [IU] via SUBCUTANEOUS
  Filled 2023-02-14 (×13): qty 1

## 2023-02-14 MED ORDER — VANCOMYCIN HCL 750 MG/150ML IV SOLN
750.0000 mg | Freq: Two times a day (BID) | INTRAVENOUS | Status: DC
  Administered 2023-02-15 – 2023-02-19 (×9): 750 mg via INTRAVENOUS
  Filled 2023-02-14 (×10): qty 150

## 2023-02-14 MED ORDER — SODIUM CHLORIDE 0.9 % IV SOLN
2.0000 g | Freq: Three times a day (TID) | INTRAVENOUS | Status: DC
  Administered 2023-02-15 – 2023-02-19 (×14): 2 g via INTRAVENOUS
  Filled 2023-02-14 (×15): qty 12.5

## 2023-02-14 MED ORDER — GADOBUTROL 1 MMOL/ML IV SOLN
8.0000 mL | Freq: Once | INTRAVENOUS | Status: AC | PRN
  Administered 2023-02-14: 8 mL via INTRAVENOUS

## 2023-02-14 MED ORDER — SACUBITRIL-VALSARTAN 24-26 MG PO TABS
1.0000 | ORAL_TABLET | Freq: Two times a day (BID) | ORAL | Status: DC
  Administered 2023-02-14 – 2023-02-19 (×10): 1 via ORAL
  Filled 2023-02-14 (×11): qty 1

## 2023-02-14 MED ORDER — SODIUM CHLORIDE 0.9% FLUSH
3.0000 mL | Freq: Two times a day (BID) | INTRAVENOUS | Status: DC
  Administered 2023-02-14 – 2023-02-19 (×8): 3 mL via INTRAVENOUS

## 2023-02-14 MED ORDER — CLOPIDOGREL BISULFATE 75 MG PO TABS
75.0000 mg | ORAL_TABLET | Freq: Every day | ORAL | Status: DC
  Administered 2023-02-14 – 2023-02-19 (×5): 75 mg via ORAL
  Filled 2023-02-14 (×5): qty 1

## 2023-02-14 MED ORDER — MIRTAZAPINE 15 MG PO TABS
15.0000 mg | ORAL_TABLET | Freq: Every day | ORAL | Status: DC
  Administered 2023-02-14 – 2023-02-18 (×5): 15 mg via ORAL
  Filled 2023-02-14 (×5): qty 1

## 2023-02-14 MED ORDER — METRONIDAZOLE 500 MG/100ML IV SOLN
500.0000 mg | Freq: Once | INTRAVENOUS | Status: AC
Start: 2023-02-14 — End: 2023-02-14
  Administered 2023-02-14: 500 mg via INTRAVENOUS
  Filled 2023-02-14: qty 100

## 2023-02-14 MED ORDER — FOLIC ACID 1 MG PO TABS
1.0000 mg | ORAL_TABLET | Freq: Every day | ORAL | Status: DC
  Administered 2023-02-14 – 2023-02-19 (×6): 1 mg via ORAL
  Filled 2023-02-14 (×6): qty 1

## 2023-02-14 MED ORDER — ONDANSETRON HCL 4 MG PO TABS: 4.0000 mg | ORAL_TABLET | Freq: Four times a day (QID) | ORAL | Status: DC | PRN

## 2023-02-14 MED ORDER — AMITRIPTYLINE HCL 50 MG PO TABS: 25.0000 mg | ORAL_TABLET | Freq: Every day | ORAL | Status: DC

## 2023-02-14 MED ORDER — HYDRALAZINE HCL 20 MG/ML IJ SOLN: 5.0000 mg | Freq: Three times a day (TID) | INTRAMUSCULAR | Status: DC | PRN

## 2023-02-14 MED ORDER — ONDANSETRON HCL 4 MG/2ML IJ SOLN
4.0000 mg | Freq: Four times a day (QID) | INTRAMUSCULAR | Status: DC | PRN
  Administered 2023-02-17: 4 mg via INTRAVENOUS
  Filled 2023-02-14: qty 2

## 2023-02-14 MED ORDER — ATORVASTATIN CALCIUM 20 MG PO TABS
80.0000 mg | ORAL_TABLET | Freq: Every day | ORAL | Status: DC
  Administered 2023-02-14 – 2023-02-18 (×5): 80 mg via ORAL
  Filled 2023-02-14 (×5): qty 4

## 2023-02-14 MED ORDER — ACETAMINOPHEN 650 MG RE SUPP: 650.0000 mg | Freq: Four times a day (QID) | RECTAL | Status: DC | PRN

## 2023-02-14 MED ORDER — POLYSACCHARIDE IRON COMPLEX 150 MG PO CAPS
150.0000 mg | ORAL_CAPSULE | Freq: Every day | ORAL | Status: DC
  Administered 2023-02-15 – 2023-02-19 (×5): 150 mg via ORAL
  Filled 2023-02-14 (×5): qty 1

## 2023-02-14 MED ORDER — SODIUM CHLORIDE 0.9 % IV SOLN
2.0000 g | Freq: Once | INTRAVENOUS | Status: AC
  Administered 2023-02-14: 2 g via INTRAVENOUS
  Filled 2023-02-14: qty 12.5

## 2023-02-14 MED ORDER — INSULIN GLARGINE-YFGN 100 UNIT/ML ~~LOC~~ SOLN
5.0000 [IU] | Freq: Every day | SUBCUTANEOUS | Status: DC
  Administered 2023-02-15: 5 [IU] via SUBCUTANEOUS
  Filled 2023-02-14 (×2): qty 0.05

## 2023-02-14 MED ORDER — ADULT MULTIVITAMIN W/MINERALS CH
1.0000 | ORAL_TABLET | Freq: Every day | ORAL | Status: DC
  Administered 2023-02-14 – 2023-02-19 (×6): 1 via ORAL
  Filled 2023-02-14 (×6): qty 1

## 2023-02-14 MED ORDER — THIAMINE HCL 100 MG/ML IJ SOLN
100.0000 mg | Freq: Every day | INTRAMUSCULAR | Status: DC
  Filled 2023-02-14 (×3): qty 2

## 2023-02-14 MED ORDER — METRONIDAZOLE 500 MG/100ML IV SOLN
500.0000 mg | Freq: Two times a day (BID) | INTRAVENOUS | Status: DC
  Administered 2023-02-15 – 2023-02-19 (×8): 500 mg via INTRAVENOUS
  Filled 2023-02-14 (×10): qty 100

## 2023-02-14 MED ORDER — SPIRONOLACTONE 25 MG PO TABS
50.0000 mg | ORAL_TABLET | Freq: Every day | ORAL | Status: DC
  Administered 2023-02-14 – 2023-02-19 (×6): 50 mg via ORAL
  Filled 2023-02-14 (×6): qty 2

## 2023-02-14 MED ORDER — TAMSULOSIN HCL 0.4 MG PO CAPS
0.4000 mg | ORAL_CAPSULE | Freq: Every day | ORAL | Status: DC
  Administered 2023-02-15 – 2023-02-18 (×3): 0.4 mg via ORAL
  Filled 2023-02-14 (×3): qty 1

## 2023-02-14 NOTE — Assessment & Plan Note (Signed)
-   Hold home antiglycemic agents - SSI, moderate - Semglee 5 units at bedtime

## 2023-02-14 NOTE — Assessment & Plan Note (Addendum)
At this time, patient appears euvolemic.  - Continue home GDMT and torsemide - Daily weights

## 2023-02-14 NOTE — Assessment & Plan Note (Signed)
blood pressure control important in reducing the progression of atherosclerotic disease. On appropriate oral medications.  

## 2023-02-14 NOTE — Progress Notes (Signed)
PHARMACY -  BRIEF ANTIBIOTIC NOTE   Pharmacy has received consult(s) for wound infection from an ED provider.  The patient's profile has been reviewed for ht/wt/allergies/indication/available labs.    One time order(s) placed for vancomycin and cefepime.  Further antibiotics/pharmacy consults should be ordered by admitting physician if indicated.                       Thank you, Rockwell Alexandria 02/14/2023  6:31 PM

## 2023-02-14 NOTE — ED Provider Notes (Signed)
[facility] Provider Note    Event Date/Time   First MD Initiated Contact with Patient 02/14/23 1619     (approximate)   History   Weakness   HPI  Manuel [name] is a 77 y.o. male  with PMHx STEMI, CAD, HTN, PVD s/p recent angio, h/o prior gangrene of toes s/p resection, here with generalized weakness. Pt reports that over the past several weeks he has had progressively worsening generalized weakness, to the point where he even missed a PCP appt this week because he felt so weeak. He has had associated night sweats, fatigue. No fevers. No abd pain. No cp or SOB, no coughing. He does note he has had an increasingly painful and foul-smelling wound of his R foot at his prior amputation sites - this is new.       Physical Exam   Triage Vital Signs: ED Triage Vitals  Encounter Vitals Group     BP 02/14/23 1146 114/88     Systolic BP Percentile --      Diastolic BP Percentile --      Pulse Rate 02/14/23 1146 72     Resp 02/14/23 1146 17     Temp 02/14/23 1147 97.9 F (36.6 C)     Temp Source 02/14/23 1147 Oral     SpO2 02/14/23 1146 100 %     Weight --      Height --      Head Circumference --      Peak Flow --      Pain Score 02/14/23 1146 6     Pain Loc --      Pain Education --      Exclude from Growth Chart --     Most recent vital signs: Vitals:   02/17/23 1800 02/17/23 1805  BP: (!) 148/78 (!) 165/79  Pulse: 70 66  Resp:  16  Temp:  98.6 F (37 C)  SpO2: 95% 98%     General: Awake, no distress.  CV:  Good peripheral perfusion. RRR.  Resp:  Normal work of breathing. Lungs CTAB. No w/r/r. No rhonchi. Abd:  No distention. No tenderness. No guarding or rebound.  Other:  No LE edema. 1+ Dp pulses b/l with chronic venous stasis changes. On the R foot, there is a foul-smelling, deep tracking ulcer along the prior amputation site of 4th-5th digits, with some tracking.   ED Results / Procedures / Treatments   Labs (all labs ordered  are listed, but only abnormal results are displayed) Labs Reviewed  SURGICAL PCR SCREEN - Abnormal; Notable for the following components:      Result Value   Staphylococcus aureus POSITIVE (*)    All other components within normal limits  BASIC METABOLIC PANEL - Abnormal; Notable for the following components:   Sodium 133 (*)    Chloride 95 (*)    Glucose, Bld 194 (*)    BUN 26 (*)    All other components within normal limits  CBC - Abnormal; Notable for the following components:   WBC 10.8 (*)    All other components within normal limits  URINALYSIS, ROUTINE W REFLEX MICROSCOPIC - Abnormal; Notable for the following components:   Color, Urine YELLOW (*)    APPearance CLEAR (*)    Glucose, UA >=500 (*)    Protein, ur 100 (*)    All other components within normal limits  BRAIN NATRIURETIC PEPTIDE - Abnormal; Notable for the following components:   B Natriuretic  Peptide 445.2 (*)    All other components within normal limits  CBC - Abnormal; Notable for the following components:   HCT 38.7 (*)    All other components within normal limits  BASIC METABOLIC PANEL - Abnormal; Notable for the following components:   Glucose, Bld 206 (*)    All other components within normal limits  GLUCOSE, CAPILLARY - Abnormal; Notable for the following components:   Glucose-Capillary 177 (*)    All other components within normal limits  GLUCOSE, CAPILLARY - Abnormal; Notable for the following components:   Glucose-Capillary 208 (*)    All other components within normal limits  GLUCOSE, CAPILLARY - Abnormal; Notable for the following components:   Glucose-Capillary 201 (*)    All other components within normal limits  GLUCOSE, CAPILLARY - Abnormal; Notable for the following components:   Glucose-Capillary 168 (*)    All other components within normal limits  GLUCOSE, CAPILLARY - Abnormal; Notable for the following components:   Glucose-Capillary 209 (*)    All other components within normal  limits  GLUCOSE, CAPILLARY - Abnormal; Notable for the following components:   Glucose-Capillary 161 (*)    All other components within normal limits  GLUCOSE, CAPILLARY - Abnormal; Notable for the following components:   Glucose-Capillary 145 (*)    All other components within normal limits  BASIC METABOLIC PANEL - Abnormal; Notable for the following components:   Sodium 133 (*)    Glucose, Bld 211 (*)    BUN 33 (*)    All other components within normal limits  GLUCOSE, CAPILLARY - Abnormal; Notable for the following components:   Glucose-Capillary 185 (*)    All other components within normal limits  GLUCOSE, CAPILLARY - Abnormal; Notable for the following components:   Glucose-Capillary 202 (*)    All other components within normal limits  GLUCOSE, CAPILLARY - Abnormal; Notable for the following components:   Glucose-Capillary 192 (*)    All other components within normal limits  GLUCOSE, CAPILLARY - Abnormal; Notable for the following components:   Glucose-Capillary 169 (*)    All other components within normal limits  GLUCOSE, CAPILLARY - Abnormal; Notable for the following components:   Glucose-Capillary 184 (*)    All other components within normal limits  GLUCOSE, CAPILLARY - Abnormal; Notable for the following components:   Glucose-Capillary 468 (*)    All other components within normal limits  GLUCOSE, CAPILLARY - Abnormal; Notable for the following components:   Glucose-Capillary 519 (*)    All other components within normal limits  GLUCOSE, CAPILLARY - Abnormal; Notable for the following components:   Glucose-Capillary 486 (*)    All other components within normal limits  GLUCOSE, CAPILLARY - Abnormal; Notable for the following components:   Glucose-Capillary 410 (*)    All other components within normal limits  RESP PANEL BY RT-PCR (RSV, FLU A&B, COVID)  [id number]  CULTURE, BLOOD (ROUTINE X 2)  CULTURE, BLOOD (ROUTINE X 2)  AEROBIC/ANAEROBIC CULTURE W GRAM STAIN  (SURGICAL/DEEP WOUND)  AEROBIC CULTURE W GRAM STAIN (SUPERFICIAL SPECIMEN)  SEDIMENTATION RATE  VITAMIN B12  FOLATE  MAGNESIUM  PHOSPHORUS  TSH  VITAMIN B1  CBC  BASIC METABOLIC PANEL  CBG MONITORING, ED  SURGICAL PATHOLOGY     EKG Normal sinus rhythm, VR 74. PR 182, QRS 104, QTc 463. No acute ST elevations or depressions. No ischemia or infarct.   RADIOLOGY Dg Foot: No fx, no obvious osteo   I also independently reviewed and agree with radiologist  interpretations.   PROCEDURES:  Critical Care performed: No    MEDICATIONS ORDERED IN ED: Medications  sodium chloride flush (NS) 0.9 % injection 3 mL (3 mLs Intravenous Given 02/17/23 2035)  acetaminophen (TYLENOL) tablet 650 mg ( Oral MAR Unhold 02/17/23 1810)    Or  acetaminophen (TYLENOL) suppository 650 mg ( Rectal MAR Unhold 02/17/23 1810)  oxyCODONE (Oxy IR/ROXICODONE) immediate release tablet 5 mg ( Oral MAR Unhold 02/17/23 1810)  HYDROmorphone (DILAUDID) injection 0.5-1 mg ( Intravenous MAR Unhold 02/17/23 1810)  polyethylene glycol (MIRALAX / GLYCOLAX) packet 17 g ( Oral MAR Unhold 02/17/23 1810)  ondansetron (ZOFRAN) tablet 4 mg ( Oral MAR Unhold 02/17/23 1810)    Or  ondansetron (ZOFRAN) injection 4 mg ( Intravenous MAR Unhold 02/17/23 1810)  aspirin EC tablet 81 mg ( Oral MAR Unhold 02/17/23 1810)  atorvastatin (LIPITOR) tablet 80 mg (80 mg Oral Given 02/17/23 2033)  clopidogrel (PLAVIX) tablet 75 mg ( Oral MAR Unhold 02/17/23 1810)  gabapentin (NEURONTIN) capsule 800 mg (800 mg Oral Given 02/17/23 2032)  iron polysaccharides (NIFEREX) capsule 150 mg ( Oral MAR Unhold 02/17/23 1810)  sacubitril-valsartan (ENTRESTO) 24-26 mg per tablet (1 tablet Oral Given 02/17/23 2033)  senna-docusate (Senokot-S) tablet 1 tablet (1 tablet Oral Given 02/17/23 2032)  spironolactone (ALDACTONE) tablet 50 mg ( Oral MAR Unhold 02/17/23 1810)  tamsulosin (FLOMAX) capsule 0.4 mg ( Oral MAR Unhold 02/17/23 1810)  torsemide  (DEMADEX) tablet 20 mg ( Oral MAR Unhold 02/17/23 1810)  carvedilol (COREG) tablet 25 mg ( Oral MAR Unhold 02/17/23 1810)  mirtazapine (REMERON) tablet 15 mg (15 mg Oral Given 02/17/23 2033)  hydrALAZINE (APRESOLINE) injection 5 mg ( Intravenous MAR Unhold 02/17/23 1810)  thiamine (VITAMIN B1) tablet 100 mg ( Oral MAR Unhold 02/17/23 1810)    Or  thiamine (VITAMIN B1) injection 100 mg ( Intravenous MAR Unhold 02/17/23 1810)  folic acid (FOLVITE) tablet 1 mg ( Oral MAR Unhold 02/17/23 1810)  multivitamin with minerals tablet 1 tablet ( Oral MAR Unhold 02/17/23 1810)  metroNIDAZOLE (FLAGYL) IVPB 500 mg ( Intravenous MAR Unhold 02/17/23 1810)  insulin aspart (novoLOG) injection 0-15 Units ( Subcutaneous MAR Unhold 02/17/23 1810)  ceFEPIme (MAXIPIME) 2 g in sodium chloride 0.9 % 100 mL IVPB (2 g Intravenous New Bag/Given 02/17/23 2031)  vancomycin (VANCOREADY) IVPB 750 mg/150 mL (750 mg Intravenous New Bag/Given 02/17/23 2211)  ascorbic acid (VITAMIN C) tablet 250 mg (250 mg Oral Given 02/17/23 2032)  protein supplement (ENSURE MAX) liquid (11 oz Oral Given 02/17/23 2031)  insulin glargine-yfgn (SEMGLEE) injection 5 Units ( Subcutaneous MAR Unhold 02/17/23 1810)  insulin aspart (novoLOG) injection 3 Units ( Subcutaneous MAR Unhold 02/17/23 1810)  mupirocin ointment (BACTROBAN) 2 % 1 Application (1 Application Nasal Not Given 02/17/23 2200)  Chlorhexidine Gluconate Cloth 2 % PADS 6 each ( Topical MAR Unhold 02/17/23 1810)  metroNIDAZOLE (FLAGYL) IVPB 500 mg (0 mg Intravenous Stopped 02/14/23 2002)  vancomycin (VANCOREADY) IVPB 1750 mg/350 mL (0 mg Intravenous Stopped 02/14/23 2317)  ceFEPIme (MAXIPIME) 2 g in sodium chloride 0.9 % 100 mL IVPB (0 g Intravenous Stopped 02/14/23 2002)  gadobutrol (GADAVIST) 1 MMOL/ML injection 8 mL (8 mLs Intravenous Contrast Given 02/14/23 2104)  ceFAZolin (ANCEF) IVPB 2g/100 mL premix (2 g Intravenous Given 02/17/23 1551)  insulin aspart (novoLOG) injection 10  Units (10 Units Intravenous Given 02/17/23 2230)     IMPRESSION / MDM / ASSESSMENT AND PLAN / ED COURSE  I reviewed the triage vital signs and the nursing notes.  Differential diagnosis includes, but is not limited to, diabetic foot ulceration, deconditioning, malignancy, uncontrolled Dm, hyperglycemia, dehydration, polypharmacy  Patient's presentation is most consistent with acute presentation with potential threat to life or bodily function.  The patient is on the cardiac monitor to evaluate for evidence of arrhythmia and/or significant heart rate changes  77 yo M with h/o DM, PVD, HTN, here with generalized weakness, foul-smelling wound to R foot. Suspect actively infected diabetic foot wound, with possible deeper tracking/osteo. Pt has mild leukocytosis, but normal ESR. Lytes overall unremarkable. Imaging as above. Pt is very weak, has difficulty ambulating, and has foul-smelling, draining wound of foot. Will start on empiric broad-spectrum ABX, and I've consulted Podiatry to evaluate.    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   Shaune Pollack, MD 02/18/23 [phone]

## 2023-02-14 NOTE — Assessment & Plan Note (Signed)
His wound is almost completely healed.  We can take the [facility] off now and just go with a dry dressing.  The patient is having severe neuropathic pain of the lower extremity so I am adding Lyrica.  Follow-up in 4 to 6 weeks for wound check and we can check his perfusion again in 3 to 4 months.

## 2023-02-14 NOTE — Assessment & Plan Note (Signed)
Patient is presenting with evidence of progression of chronic ulcer on the plantar aspect of the right foot.  Given location, consistent with a diabetic foot ulcer, although there is likely an ischemic component as well. MRI of foot with concern of osteo - Podiatry consulted; planning transmetatarsal amputation on Monday - Vascular surgery was also consulted as he recent had a vascular procedure and wound VAC was removed.  Per vascular surgery patient has adequate circulation but concern of right groin wound infection so cultures were taken -Continue with broad-spectrum antibiotics

## 2023-02-14 NOTE — Assessment & Plan Note (Signed)
Patient notes gradually worsening fatigue and generalized weakness on exertion for the last 6 weeks, in addition to insomnia, poor appetite and weight loss.  There may be a component of underlying infection contributing to this, in addition to patient's comorbidities including HFrEF, however on further discussion, I suspect patient is depressed versus adjustment disorder. TSH and B12 normal, B1 pending - PT/OT after surgical evaluation -Started on mirtazapine 15 mg nightly and amitriptyline was discontinued

## 2023-02-14 NOTE — H&P (Signed)
History and Physical    Patient: Manuel [name]:[id number] DOB: 08/10/1945 DOA: 02/14/2023 DOS: the patient was seen and examined on 02/14/2023 PCP: Gracelyn Nurse, MD  Patient coming from: Home  Chief Complaint:  Chief Complaint  Patient presents with   Weakness   HPI: Manuel [name] is a 77 y.o. male with medical history significant of HFrEF with last EF of 25-30% (July 2024) 2/2 ischemic cardiomyopathy, CAD s/p CABG, peripheral artery disease s/p multiple interventions (most recently July 2024), insulin-dependent type 2 diabetes, hypertension, hyperlipidemia, OSA not on CPAP, who presents to the ED due to generalized weakness.  Manuel [name] states that he has been experiencing gradually worsening fatigue with exertion for the last 6 weeks.  He states that he has gotten to the point where he can only take a few steps before he feels completely out of energy but he denies any dyspnea on exertion.  He is also lost approximately 20 pounds during this time with a lack of appetite.  He notes that during this time, he is also been unable to sleep.    In addition, he has now begun to experience worsening pain in his right leg and foul-smelling drainage from his ulcer on his right foot.  He notes that the ulcer has been there for nearly 2 years now though.  He endorses night sweats but no fevers.  He denies any chest pain, palpitations, cough, nausea, vomiting, diarrhea.  He has had some constipation since starting opioid therapy for his pain though.   ED course: On arrival to the ED, patient was normotensive at 114/88 with heart rate of 72.  He was saturating at 100% on room air.  He was afebrile at 97.9.  Initial workup notable for WBC of 10.8, sodium of 133, glucose 194, BUN 26, creatinine 0.96 with GFR above 60.  COVID-19, RSV and influenza PCR negative.  UA without evidence of infection. Right foot x-ray was obtained with no evidence of osteomyelitis.  Podiatry was consulted and patient  was started on vancomycin/cefepime/Flagyl.  TR[facility] contacted for admission.  Review of Systems: As mentioned in the history of present illness. All other systems reviewed and are negative.  Past Medical History:  Diagnosis Date   Acute ST elevation myocardial infarction (STEMI) of inferior wall (HCC) 04/19/2010   a.) transfered from [facility] to [facility] --> LHC/PCI (very difficult procedure) --> 3.0 x 23 mm and 3.0 x 12 mm Xience stents to RCA   Allergies    Arthritis    Benign essential hypertension    Bilateral carotid artery disease (HCC) 05/08/2021   a.) carotid doppler 05/08/2021: 1-39% BICA   CAD (coronary artery disease) 04/19/2010   a.) inferior STEMI 04/19/2010 --> LHC/PCI: 50-70% pD1, 80% pRI, 90/90/90% RCA (overlapping 3.0 x 23 and 3.0 x 12 mm Xience DES); b.) MV 11/10/2018: fixed minimally reversible inferior perfusion defect   Cellulitis of foot    DDD (degenerative disc disease), cervical    Diabetes mellitus type 2, insulin dependent (HCC)    Diverticulosis    Full dentures    Gout    Hard of hearing    History of bilateral cataract extraction 2022   History of ETOH abuse    Hyperlipidemia    Ischemic cardiomyopathy 04/19/2010   a.) TTE 04/19/2010: 40%; b.) TTE 04/20/2014: EF >55%, mild RVE, triv PR, mild MR/TR, G1DD; c.) TTE 11/10/2018: EF 45%, inf HK, mild RVE, triv TR/PR, mild MR, G1DD; d.) TTE 11/14/2022: EF 25-30%, basal anteroseptal, apical  lateral, apical septal, and apex AK, mid anterolateral HK, mild-mod MR/TR   Long term current use of aspirin    Long term current use of clopidogrel    Lumbar degenerative disc disease    Lumbar radiculopathy    Lumbar vertebral fracture (chronic superior endplate of L1)    OSA (obstructive sleep apnea)    a.) unable to tolerate nocturnal PAP therapy   Peripheral artery disease (HCC)    a.) stenting 05/14/21: 12 mm x 12 cm LifeStent RIGHT dis SFA/prox pop; b.) s/p cath directed thrombolysis RIGHT SFA/pop 06/14/21; c.) s/p mech  thrombectomy + stenting 06/15/21: 8 mm x 25cm & 8 mm x 7.5cm Viabahn; d.) s/p BILAT CFA, profunda femoris, SFA endarterectomies + fogarty embolectomy + stenting 11/15/22: 12mm x 58mm Lifestream BILAT CIAs, 14 mm x 6 cm Lifestream & 13 mm x 5 cm Viabahn LEFT EIA   Peripheral neuropathy    Umbilical hernia    Past Surgical History:  Procedure Laterality Date   AMPUTATION Right 11/18/2022   Procedure: AMPUTATION 4TH AND 5TH RAY;  Surgeon: Linus Galas, [name];  Location: [facility];  Service: Orthopedics/Podiatry;  Laterality: Right;  4th and 5th toe   APPLICATION OF WOUND VAC Right 01/09/2023   Procedure: APPLICATION OF WOUND VAC;  Surgeon: Annice Needy, MD;  Location: [facility];  Service: Vascular;  Laterality: Right;   CATARACT EXTRACTION W/PHACO Right 03/14/2021   Procedure: CATARACT EXTRACTION PHACO AND INTRAOCULAR LENS PLACEMENT (IOC) RIGHT DIABETIC;  Surgeon: Lockie Mola, MD;  Location: [facility];  Service: Ophthalmology;  Laterality: Right;  Diabetic 16.78 01:39.9   CATARACT EXTRACTION W/PHACO Left 03/28/2021   Procedure: CATARACT EXTRACTION PHACO AND INTRAOCULAR LENS PLACEMENT (IOC) LEFT DIABETIC 6.93 01:22.0;  Surgeon: Lockie Mola, MD;  Location: [facility];  Service: Ophthalmology;  Laterality: Left;  Diabetic   COLONOSCOPY     CORONARY ANGIOPLASTY WITH STENT PLACEMENT  03/2010   Procedure: CORONARY ANGIOPLASTY WITH STENT PLACEMENT; Location: [name]   ENDARTERECTOMY FEMORAL Bilateral 11/15/2022   Procedure: BILATERAL COMMON FEMORAL PROFUNDA FEMORIS AND SUPERFICIAL FEMORAL ARTERY ENDARTECTOMIES, RIGHT FOGARTY EMBOLECTOMY OF THE RIGHT SFA  AND  POPLITEAL ARTERIES. AORTAGRAM AND RIGHT LOWER EXTREMITY ANGIOGRAM.;  Surgeon: Annice Needy, MD;  Location: [facility];  Service: Vascular;  Laterality: Bilateral;   INSERTION OF ILIAC STENT Bilateral 11/15/2022   Procedure: BILATERAL STENT INSERTION IN BILATERAL  COMMON ILIAC ARTERY, STENT INSERTION OF LEFT EXTERNAL ILIAC  ARTERY. ANGIOPLASTY RIGHT TIBIAL  AND POPLITEAL ARTERY.;  Surgeon: Annice Needy, MD;  Location: [facility];  Service: Vascular;  Laterality: Bilateral;   LOWER EXTREMITY ANGIOGRAPHY Right 05/14/2021   Procedure: LOWER EXTREMITY ANGIOGRAPHY;  Surgeon: Annice Needy, MD;  Location: [facility];  Service: Cardiovascular;  Laterality: Right;   LOWER EXTREMITY ANGIOGRAPHY Right 06/14/2021   Procedure: Lower Extremity Angiography;  Surgeon: Annice Needy, MD;  Location: [facility];  Service: Cardiovascular;  Laterality: Right;   LOWER EXTREMITY ANGIOGRAPHY Right 11/11/2022   Procedure: Lower Extremity Angiography;  Surgeon: Annice Needy, MD;  Location: [facility];  Service: Cardiovascular;  Laterality: Right;   LOWER EXTREMITY INTERVENTION Right 06/15/2021   Procedure: LOWER EXTREMITY INTERVENTION;  Surgeon: Annice Needy, MD;  Location: [facility];  Service: Cardiovascular;  Laterality: Right;   TONSILLECTOMY     WOUND DEBRIDEMENT Right 01/09/2023   Procedure: DEBRIDEMENT WOUND;  Surgeon: Annice Needy, MD;  Location: [facility];  Service: Vascular;  Laterality: Right;  Social History:  reports that he has never smoked. He has never used smokeless tobacco. He reports current alcohol use of about 21.0 standard drinks of alcohol per week. He reports that he does not use drugs.  No Known Allergies  Family History  Problem Relation Age of Onset   Scoliosis Mother    Heart disease Father     Prior to Admission medications   Medication Sig Start Date End Date Taking? Authorizing Provider  amitriptyline (ELAVIL) 25 MG tablet Take 1 tablet (25 mg total) by mouth at bedtime. 01/19/23 04/19/23  Gillis Santa, MD  ascorbic acid (VITAMIN C) 500 MG tablet Take 1 tablet (500 mg total) by mouth daily. 01/20/23 02/19/23  Gillis Santa, MD  aspirin EC 81 MG tablet Take 1 tablet (81 mg total) by mouth daily. 11/26/22   Delfino Lovett, MD  atorvastatin (LIPITOR) 80 MG tablet Take 1 tablet  (80 mg total) by mouth daily. Patient taking differently: Take 80 mg by mouth at bedtime. 11/26/22   Delfino Lovett, MD  carvedilol (COREG) 25 MG tablet Take 25 mg by mouth 2 (two) times daily with a meal.    [provider]  clopidogrel (PLAVIX) 75 MG tablet Take 1 tablet (75 mg total) by mouth daily. 09/18/22   Georgiana Spinner, NP  dapagliflozin propanediol (FARXIGA) 10 MG TABS tablet Take 1 tablet (10 mg total) by mouth daily. 01/20/23 04/20/23  Gillis Santa, MD  Ensure Max Protein (ENSURE MAX PROTEIN) LIQD Take 330 mLs (11 oz total) by mouth 2 (two) times daily. Patient taking differently: Take 11 oz by mouth daily. 11/26/22   Delfino Lovett, MD  fluticasone (FLONASE) 50 MCG/ACT nasal spray Place 1 spray into both nostrils daily as needed for allergies or rhinitis.    [provider]  gabapentin (NEURONTIN) 400 MG capsule Take 800 mg by mouth 2 (two) times daily. 12/06/21   [provider]  insulin aspart protamine- aspart (NOVOLOG MIX 70/30) (70-30) 100 UNIT/ML injection Inject 30 Units into the skin 2 (two) times daily with a meal.    [provider]  iron polysaccharides (NIFEREX) 150 MG capsule Take 1 capsule (150 mg total) by mouth daily. 01/20/23 04/20/23  Gillis Santa, MD  levocetirizine (XYZAL) 5 MG tablet Take 1 tablet (5 mg total) by mouth at bedtime as needed for allergies. 01/19/23   Gillis Santa, MD  losartan (COZAAR) 25 MG tablet Take 1 tablet (25 mg total) by mouth daily. 01/20/23 04/20/23  Gillis Santa, MD  oxyCODONE (OXY IR/ROXICODONE) 5 MG immediate release tablet Take 1-2 tablets (5-10 mg total) by mouth every 6 (six) hours as needed for severe pain. 01/30/23   Georgiana Spinner, NP  pregabalin (LYRICA) 75 MG capsule Take 1 capsule (75 mg total) by mouth 2 (two) times daily. 02/14/23   Annice Needy, MD  senna-docusate (SENOKOT-S) 8.6-50 MG tablet Take 1 tablet by mouth at bedtime as needed for mild constipation. 06/17/21   Burnadette Pop, MD   spironolactone (ALDACTONE) 50 MG tablet Take 1 tablet (50 mg total) by mouth daily. 11/26/22 01/01/24  Delfino Lovett, MD  tamsulosin (FLOMAX) 0.4 MG CAPS capsule Take 1 capsule (0.4 mg total) by mouth daily after supper. 01/19/23 04/19/23  Gillis Santa, MD  torsemide (DEMADEX) 20 MG tablet Take 1 tablet (20 mg total) by mouth daily. 01/20/23 04/20/23  Gillis Santa, MD    Physical Exam: Vitals:   02/14/23 1800 02/14/23 1830 02/14/23 1900 02/14/23 2000  BP: (!) 174/86 (!) 171/87 Marland Kitchen)  193/95 (!) 187/89  Pulse: 72 76 74 78  Resp:    18  Temp:      TempSrc:      SpO2: 100% 100% 100% 100%   Physical Exam Vitals and nursing note reviewed.  Constitutional:      General: He is not in acute distress.    Appearance: He is normal weight.  HENT:     Head: Normocephalic and atraumatic.  Eyes:     Conjunctiva/sclera: Conjunctivae normal.     Pupils: Pupils are equal, round, and reactive to light.  Cardiovascular:     Rate and Rhythm: Normal rate and regular rhythm.     Heart sounds: No murmur heard. Pulmonary:     Effort: Pulmonary effort is normal. No respiratory distress.     Breath sounds: Normal breath sounds. No wheezing, rhonchi or rales.  Abdominal:     General: Bowel sounds are normal. There is no distension.     Palpations: Abdomen is soft.     Tenderness: There is no abdominal tenderness. There is no guarding.  Musculoskeletal:     Right lower leg: No edema.     Left lower leg: No edema.  Skin:    General: Skin is warm and dry.     Comments: Please see picture below of ulcer on the plantar aspect of the right foot  Neurological:     Mental Status: He is alert and oriented to person, place, and time.  Psychiatric:        Mood and Affect: Mood normal.        Behavior: Behavior normal.    Data Reviewed: CBC with WBC of 10.8, hemoglobin of 14.4, MCV of 81.4, platelets of 303 BMP with sodium of 133, potassium 3.7, bicarb 27, glucose 194, BUN 26, creatinine 0.96 with GFR above  60 ESR of 8 COVID-19, RSV and influenza PCR negative Urinalysis with glucosuria and proteinuria only  EKG appears only reviewed.  Sinus rhythm with rate of 74.  Deep Q waves present in inferior and lateral leads.  Otherwise no ST or T wave changes concerning for acute ischemia.  DG Foot Complete Right  Result Date: 02/14/2023 CLINICAL DATA:  Unhealing wound to second metatarsal area. History of diabetic neuropathy. EXAM: RIGHT FOOT COMPLETE - 3+ VIEW COMPARISON:  Foot radiographs 11/10/2022. FINDINGS: There has been interval amputation of fourth and fifth rays at the distal metatarsal metaphyses. The amputation margins are smooth, without definite osseous irregularity or erosion. There is soft tissue lucency along the undersurface of the third metatarsophalangeal joint likely corresponding to the reported ulceration. A small focus of hyperdensity within the lucency may reflect nonspecific calcification or less likely a bone fragment as no donor site is identified. There is no definite osseous erosion or destruction of the underlying bone. Otherwise, there is no acute osseous finding. Mild Achilles enthesopathy is again noted. There is diffuse soft tissue swelling over the dorsum of the foot. IMPRESSION: 1. Interval amputation of fourth and fifth rays at the distal metatarsal metaphysis without definite osseous erosion or destruction along the amputation margins. 2. Soft tissue ulceration along the undersurface of the third metatarsophalangeal joint with nonspecific calcification or less likely a bony fragment within the ulceration. No definite osseous erosion or destruction of the underlying bone. Consider MRI for further evaluation if indicated. Electronically Signed   By: Lesia Hausen M.D.   On: 02/14/2023 18:19    Results are pending, will review when available.  Assessment and Plan:  * Diabetic  foot ulcer (HCC) Patient is presenting with evidence of progression of chronic ulcer on the plantar  aspect of the right foot.  Given location, consistent with a diabetic foot ulcer, although there is likely an ischemic component as well.  - Podiatry consulted; appreciate their recommendations - Consider vascular surgery consultation in the a.m. as well - MRI of the right foot pending - Continue broad-spectrum coverage with vancomycin, cefepime and Flagyl - Tylenol, oxycodone and Dilaudid as needed for pain control  Generalized weakness Patient notes gradually worsening fatigue and generalized weakness on exertion for the last 6 weeks, in addition to insomnia, poor appetite and weight loss.  There may be a component of underlying infection contributing to this, in addition to patient's comorbidities including HFrEF, however on further discussion, I suspect patient is depressed versus adjustment disorder.  - PT/OT after surgical evaluation - Will check for reversible causes including TSH, vitamin B12, vitamin B1, folic acid levels - Telemetry monitoring x 24 hr given possibility of underlying arrhythmia contributing - Start mirtazapine 15 mg nightly; discontinue amitriptyline  PAD (peripheral artery disease) (HCC) Complicated history PAD with multiple interventions most recently in July, At which time he underwent bilateral femoral artery endarterectomies, embolectomy of the right SFA, and multiple stent placement.  Complicated by poor wound healing requiring wound VAC, that was removed today.  - Continue DAPT and statin - May require vascular surgery consultation pending MRI results  Hypertension Patient was taking both losartan and Entresto, in addition to Coreg, but remains hypertensive in the ED.  - Discontinue home losartan - Restart home Entresto and Coreg - Hydralazine as needed for SBP over 180  Diabetes mellitus type 2, insulin dependent (HCC) - Hold home antiglycemic agents - SSI, moderate - Semglee 5 units at bedtime  Alcohol abuse Patient states that he drinks several  beers a day in addition to liquor.  On previous admissions, no evidence of alcohol withdrawal.  He realizes that he needs to discontinue his alcohol use but has been struggling with it.  - CIWA monitoring without Ativan - Daily multi vitamin folic acid and thiamine  Chronic combined systolic and diastolic CHF (congestive heart failure) (HCC) At this time, patient appears euvolemic.  - Continue home GDMT and torsemide - Daily weights  Coronary artery disease with history of myocardial infarction without history of CABG - Continue home regimen  Advance Care Planning:   Code Status: Limited: Do not attempt resuscitation (DNR) -DNR-LIMITED -Do Not Intubate/DNI At his last hospital admission, patient states he made the decision that he would not want to be resuscitated in the event of a cardiac or pulmonary arrest.  He is still in agreement with this decision.  Patient's wife at bedside for discussion.  Consults: Podiatry  Family Communication: Patient's wife updated at bedside  Severity of Illness: The appropriate patient status for this patient is INPATIENT. Inpatient status is judged to be reasonable and necessary in order to provide the required intensity of service to ensure the patient's safety. The patient's presenting symptoms, physical exam findings, and initial radiographic and laboratory data in the context of their chronic comorbidities is felt to place them at high risk for further clinical deterioration. Furthermore, it is not anticipated that the patient will be medically stable for discharge from the hospital within 2 midnights of admission.   * I certify that at the point of admission it is my clinical judgment that the patient will require inpatient hospital care spanning beyond 2 midnights from the point of  admission due to high intensity of service, high risk for further deterioration and high frequency of surveillance required.*  Author: Verdene Lennert, MD 02/14/2023 9:06  PM  For on call review [email or web address].

## 2023-02-14 NOTE — Assessment & Plan Note (Signed)
lipid control important in reducing the progression of atherosclerotic disease. Continue statin therapy  

## 2023-02-14 NOTE — Assessment & Plan Note (Signed)
Complicated history PAD with multiple interventions most recently in July, At which time he underwent bilateral femoral artery endarterectomies, embolectomy of the right SFA, and multiple stent placement.  Complicated by poor wound healing requiring wound VAC, that was removed today.  - Continue DAPT and statin - May require vascular surgery consultation pending MRI results

## 2023-02-14 NOTE — ED Notes (Signed)
Pt medicated for pain 8/10 and provided diet gingerale. Pt family brought in fast food for patient and he is eating at this time.

## 2023-02-14 NOTE — ED Notes (Signed)
Transport requested to move patient to inpatient floor.

## 2023-02-14 NOTE — Assessment & Plan Note (Signed)
Patient was taking both losartan and Entresto, in addition to Coreg, but remains hypertensive in the ED.  - Discontinue home losartan - Restart home Entresto and Coreg - Hydralazine as needed for SBP over 180

## 2023-02-14 NOTE — Progress Notes (Signed)
MRN : [id number]  Manuel [name] is a 77 y.o. (09-20-1945) male who presents with chief complaint of  Chief Complaint  Patient presents with   Wound Check    Groin wound check  .  History of Present Illness: Patient returns today in follow up of his PAD, neuropathy, and groin wound.  His right groin wound was debrided about 6 weeks ago and a VAC dressing was placed.  The wound is almost completely healed at this point with subcentimeter granulation areas in the midportion and superior portion of the wound.  His original surgery was 3 months ago.  He is still having a lot of neuropathic pain worse on the right leg than the left.  The pain will often be a sharp stabbing pain for several seconds and then stinging 4 minutes after that that wake him from sleep.  He says he is not sleeping much.  He is also now getting over respiratory illness he got in the hospital when he was here for a cardiac issue a few weeks ago.  Current Outpatient Medications  Medication Sig Dispense Refill   amitriptyline (ELAVIL) 25 MG tablet Take 1 tablet (25 mg total) by mouth at bedtime. 30 tablet 2   ascorbic acid (VITAMIN C) 500 MG tablet Take 1 tablet (500 mg total) by mouth daily. 30 tablet 0   aspirin EC 81 MG tablet Take 1 tablet (81 mg total) by mouth daily. 150 tablet 2   atorvastatin (LIPITOR) 80 MG tablet Take 1 tablet (80 mg total) by mouth daily. (Patient taking differently: Take 80 mg by mouth at bedtime.) 90 tablet 1   carvedilol (COREG) 25 MG tablet Take 25 mg by mouth 2 (two) times daily with a meal.     clopidogrel (PLAVIX) 75 MG tablet Take 1 tablet (75 mg total) by mouth daily. 90 tablet 1   dapagliflozin propanediol (FARXIGA) 10 MG TABS tablet Take 1 tablet (10 mg total) by mouth daily. 30 tablet 2   Ensure Max Protein (ENSURE MAX PROTEIN) LIQD Take 330 mLs (11 oz total) by mouth 2 (two) times daily. (Patient taking differently: Take 11 oz by mouth daily.) 10000 mL 3   fluticasone (FLONASE) 50  MCG/ACT nasal spray Place 1 spray into both nostrils daily as needed for allergies or rhinitis.     gabapentin (NEURONTIN) 400 MG capsule Take 800 mg by mouth 2 (two) times daily.     insulin aspart protamine- aspart (NOVOLOG MIX 70/30) (70-30) 100 UNIT/ML injection Inject 30 Units into the skin 2 (two) times daily with a meal.     iron polysaccharides (NIFEREX) 150 MG capsule Take 1 capsule (150 mg total) by mouth daily. 90 capsule 0   levocetirizine (XYZAL) 5 MG tablet Take 1 tablet (5 mg total) by mouth at bedtime as needed for allergies.     losartan (COZAAR) 25 MG tablet Take 1 tablet (25 mg total) by mouth daily. 30 tablet 2   oxyCODONE (OXY IR/ROXICODONE) 5 MG immediate release tablet Take 1-2 tablets (5-10 mg total) by mouth every 6 (six) hours as needed for severe pain. 25 tablet 0   senna-docusate (SENOKOT-S) 8.6-50 MG tablet Take 1 tablet by mouth at bedtime as needed for mild constipation. 30 tablet 0   spironolactone (ALDACTONE) 50 MG tablet Take 1 tablet (50 mg total) by mouth daily. 30 tablet 0   tamsulosin (FLOMAX) 0.4 MG CAPS capsule Take 1 capsule (0.4 mg total) by mouth daily after supper. 30 capsule 2  torsemide (DEMADEX) 20 MG tablet Take 1 tablet (20 mg total) by mouth daily. 30 tablet 2   No current facility-administered medications for this visit.    Past Medical History:  Diagnosis Date   Acute ST elevation myocardial infarction (STEMI) of inferior wall (HCC) 04/19/2010   a.) transfered from [facility] --> LHC/PCI (very difficult procedure) --> 3.0 x 23 mm and 3.0 x 12 mm Xience stents to RCA   Allergies    Arthritis    Benign essential hypertension    Bilateral carotid artery disease (HCC) 05/08/2021   a.) carotid doppler 05/08/2021: 1-39% BICA   CAD (coronary artery disease) 04/19/2010   a.) inferior STEMI 04/19/2010 --> LHC/PCI: 50-70% pD1, 80% pRI, 90/90/90% RCA (overlapping 3.0 x 23 and 3.0 x 12 mm Xience DES); b.) MV 11/10/2018: fixed minimally reversible  inferior perfusion defect   Cellulitis of foot    DDD (degenerative disc disease), cervical    Diabetes mellitus type 2, insulin dependent (HCC)    Diverticulosis    Full dentures    Gout    Hard of hearing    History of bilateral cataract extraction 2022   History of ETOH abuse    Hyperlipidemia    Ischemic cardiomyopathy 04/19/2010   a.) TTE 04/19/2010: 40%; b.) TTE 04/20/2014: EF >55%, mild RVE, triv PR, mild MR/TR, G1DD; c.) TTE 11/10/2018: EF 45%, inf HK, mild RVE, triv TR/PR, mild MR, G1DD; d.) TTE 11/14/2022: EF 25-30%, basal anteroseptal, apical lateral, apical septal, and apex AK, mid anterolateral HK, mild-mod MR/TR   Long term current use of aspirin    Long term current use of clopidogrel    Lumbar degenerative disc disease    Lumbar radiculopathy    Lumbar vertebral fracture (chronic superior endplate of L1)    OSA (obstructive sleep apnea)    a.) unable to tolerate nocturnal PAP therapy   Peripheral artery disease (HCC)    a.) stenting 05/14/21: 12 mm x 12 cm LifeStent RIGHT dis SFA/prox pop; b.) s/p cath directed thrombolysis RIGHT SFA/pop 06/14/21; c.) s/p mech thrombectomy + stenting 06/15/21: 8 mm x 25cm & 8 mm x 7.5cm Viabahn; d.) s/p BILAT CFA, profunda femoris, SFA endarterectomies + fogarty embolectomy + stenting 11/15/22: 12mm x 58mm Lifestream BILAT CIAs, 14 mm x 6 cm Lifestream & 13 mm x 5 cm Viabahn LEFT EIA   Peripheral neuropathy    Umbilical hernia     Past Surgical History:  Procedure Laterality Date   AMPUTATION Right 11/18/2022   Procedure: AMPUTATION 4TH AND 5TH RAY;  Surgeon: Linus Galas, [name];  Location: [facility];  Service: Orthopedics/Podiatry;  Laterality: Right;  4th and 5th toe   APPLICATION OF WOUND VAC Right 01/09/2023   Procedure: APPLICATION OF WOUND VAC;  Surgeon: Annice Needy, MD;  Location: [facility];  Service: Vascular;  Laterality: Right;   CATARACT EXTRACTION W/PHACO Right 03/14/2021   Procedure: CATARACT EXTRACTION PHACO AND INTRAOCULAR LENS  PLACEMENT (IOC) RIGHT DIABETIC;  Surgeon: Lockie Mola, MD;  Location: [facility];  Service: Ophthalmology;  Laterality: Right;  Diabetic 16.78 01:39.9   CATARACT EXTRACTION W/PHACO Left 03/28/2021   Procedure: CATARACT EXTRACTION PHACO AND INTRAOCULAR LENS PLACEMENT (IOC) LEFT DIABETIC 6.93 01:22.0;  Surgeon: Lockie Mola, MD;  Location: [facility];  Service: Ophthalmology;  Laterality: Left;  Diabetic   COLONOSCOPY     CORONARY ANGIOPLASTY WITH STENT PLACEMENT  03/2010   Procedure: CORONARY ANGIOPLASTY WITH STENT PLACEMENT; Location: [name]   ENDARTERECTOMY FEMORAL Bilateral 11/15/2022  Procedure: BILATERAL COMMON FEMORAL PROFUNDA FEMORIS AND SUPERFICIAL FEMORAL ARTERY ENDARTECTOMIES, RIGHT FOGARTY EMBOLECTOMY OF THE RIGHT SFA  AND  POPLITEAL ARTERIES. AORTAGRAM AND RIGHT LOWER EXTREMITY ANGIOGRAM.;  Surgeon: Annice Needy, MD;  Location: [facility];  Service: Vascular;  Laterality: Bilateral;   INSERTION OF ILIAC STENT Bilateral 11/15/2022   Procedure: BILATERAL STENT INSERTION IN BILATERAL  COMMON ILIAC ARTERY, STENT INSERTION OF LEFT EXTERNAL ILIAC ARTERY. ANGIOPLASTY RIGHT TIBIAL  AND POPLITEAL ARTERY.;  Surgeon: Annice Needy, MD;  Location: [facility];  Service: Vascular;  Laterality: Bilateral;   LOWER EXTREMITY ANGIOGRAPHY Right 05/14/2021   Procedure: LOWER EXTREMITY ANGIOGRAPHY;  Surgeon: Annice Needy, MD;  Location: [facility];  Service: Cardiovascular;  Laterality: Right;   LOWER EXTREMITY ANGIOGRAPHY Right 06/14/2021   Procedure: Lower Extremity Angiography;  Surgeon: Annice Needy, MD;  Location: [facility];  Service: Cardiovascular;  Laterality: Right;   LOWER EXTREMITY ANGIOGRAPHY Right 11/11/2022   Procedure: Lower Extremity Angiography;  Surgeon: Annice Needy, MD;  Location: [facility];  Service: Cardiovascular;  Laterality: Right;   LOWER EXTREMITY INTERVENTION Right 06/15/2021   Procedure: LOWER EXTREMITY INTERVENTION;   Surgeon: Annice Needy, MD;  Location: [facility];  Service: Cardiovascular;  Laterality: Right;   TONSILLECTOMY     WOUND DEBRIDEMENT Right 01/09/2023   Procedure: DEBRIDEMENT WOUND;  Surgeon: Annice Needy, MD;  Location: [facility];  Service: Vascular;  Laterality: Right;     Social History   Tobacco Use   Smoking status: Never   Smokeless tobacco: Never  Vaping Use   Vaping status: Never Used  Substance Use Topics   Alcohol use: Yes    Alcohol/week: 21.0 standard drinks of alcohol    Types: 7 Glasses of wine, 14 Cans of beer per week    Comment: occassional   Drug use: No      Family History  Problem Relation Age of Onset   Scoliosis Mother    Heart disease Father      No Known Allergies   REVIEW OF SYSTEMS (Negative unless checked)  Constitutional: [] Weight loss  [] Fever  [] Chills Cardiac: [] Chest pain   [] Chest pressure   [] Palpitations   [] Shortness of breath when laying flat   [] Shortness of breath at rest   [] Shortness of breath with exertion. Vascular:  [] Pain in legs with walking   [x] Pain in legs at rest   [] Pain in legs when laying flat   [] Claudication   [] Pain in feet when walking  [] Pain in feet at rest  [] Pain in feet when laying flat   [] History of DVT   [] Phlebitis   [] Swelling in legs   [] Varicose veins   [x] Non-healing ulcers Pulmonary:   [] Uses home oxygen   [] Productive cough   [] Hemoptysis   [] Wheeze  [] COPD   [] Asthma Neurologic:  [] Dizziness  [] Blackouts   [] Seizures   [] History of stroke   [] History of TIA  [] Aphasia   [] Temporary blindness   [] Dysphagia   [] Weakness or numbness in arms   [] Weakness or numbness in legs Musculoskeletal:  [x] Arthritis   [] Joint swelling   [] Joint pain   [] Low back pain Hematologic:  [] Easy bruising  [] Easy bleeding   [] Hypercoagulable state   [] Anemic   Gastrointestinal:  [] Blood in stool   [] Vomiting blood  [] Gastroesophageal reflux/heartburn   [] Abdominal pain Genitourinary:  [] Chronic kidney disease    [] Difficult urination  [] Frequent urination  [] Burning with urination   [] Hematuria Skin:  []   Rashes   [x] Ulcers   [x] Wounds Psychological:  [] History of anxiety   []  History of major depression.  Physical Examination  BP (!) 140/80 (BP Location: Right Arm)   Pulse 80   Resp 16   Wt 179 lb 6.4 oz (81.4 kg)   BMI 27.28 kg/m  Gen:  WD/WN, NAD Head: [place]/AT, No temporalis wasting. Ear/Nose/Throat: Hearing grossly intact, nares w/o erythema or drainage Eyes: Conjunctiva clear. Sclera non-icteric Neck: Supple.  Trachea midline Pulmonary:  Good air movement, no use of accessory muscles.  Cardiac: irregular  Vascular:  Vessel Right Left  Radial Palpable Palpable                          PT Not Palpable 1+ Palpable  DP 1+ Palpable 1+ Palpable   Gastrointestinal: soft, non-tender/non-distended. No guarding/reflex.  Musculoskeletal: M/S 5/5 throughout.  No deformity or atrophy. 1+ RLE edema. Neurologic: Sensation grossly intact in extremities.  Symmetrical.  Speech is fluent.  Psychiatric: Judgment intact, Mood & affect appropriate for pt's clinical situation. Dermatologic: Left groin wound is healed.  Right groin wound with 2 subcentimeter circular areas of wound which is not completely epithelialized with decent granulation tissue.  No surrounding erythema or drainage.      Labs Recent Results (from the past 2160 hour(s))  Glucose, capillary     Status: Abnormal   Collection Time: 11/16/22 11:19 AM  Result Value Ref Range   Glucose-Capillary 201 (H) 70 - 99 mg/dL    Comment: Glucose reference range applies only to samples taken after fasting for at least 8 hours.  Glucose, capillary     Status: Abnormal   Collection Time: 11/16/22  4:37 PM  Result Value Ref Range   Glucose-Capillary 149 (H) 70 - 99 mg/dL    Comment: Glucose reference range applies only to samples taken after fasting for at least 8 hours.  Basic metabolic panel     Status: Abnormal   Collection Time:  11/17/22  5:26 AM  Result Value Ref Range   Sodium 134 (L) 135 - 145 mmol/L   Potassium 4.1 3.5 - 5.1 mmol/L   Chloride 103 98 - 111 mmol/L   CO2 25 22 - 32 mmol/L   Glucose, Bld 132 (H) 70 - 99 mg/dL    Comment: Glucose reference range applies only to samples taken after fasting for at least 8 hours.   BUN 22 8 - 23 mg/dL   Creatinine, Ser 7.82 (H) 0.61 - 1.24 mg/dL   Calcium 7.7 (L) 8.9 - 10.3 mg/dL   GFR, Estimated 55 (L) >60 mL/min    Comment: (NOTE) Calculated using the CKD-EPI Creatinine Equation (2021)    Anion gap 6 5 - 15    Comment: Performed at [facility], [address] [place], [place] [address]  CBC     Status: Abnormal   Collection Time: 11/17/22  5:26 AM  Result Value Ref Range   WBC 10.9 (H) 4.0 - 10.5 K/uL   RBC 3.12 (L) 4.22 - 5.81 MIL/uL   Hemoglobin 9.4 (L) 13.0 - 17.0 g/dL   HCT 30.8 (L) 65.7 - 84.6 %   MCV 89.1 80.0 - 100.0 fL   MCH 30.1 26.0 - 34.0 pg   MCHC 33.8 30.0 - 36.0 g/dL   RDW 96.2 95.2 - 84.1 %   Platelets 230 150 - 400 K/uL   nRBC 0.0 0.0 - 0.2 %    Comment: Performed at [facility],  [address], [place], [place] [address]  Heparin level (unfractionated)     Status: None   Collection Time: 11/17/22  5:26 AM  Result Value Ref Range   Heparin Unfractionated 0.60 0.30 - 0.70 IU/mL    Comment: (NOTE) The clinical reportable range upper limit is being lowered to >1.10 to align with the FDA approved guidance for the current laboratory assay.  If heparin results are below expected values, and patient dosage has  been confirmed, suggest follow up testing of antithrombin III levels. Performed at [facility], [address] [place], [place] [address]   Glucose, capillary     Status: Abnormal   Collection Time: 11/17/22  7:47 AM  Result Value Ref Range   Glucose-Capillary 154 (H) 70 - 99 mg/dL    Comment: Glucose reference range applies only to samples taken after fasting for at least 8 hours.  Glucose,  capillary     Status: Abnormal   Collection Time: 11/17/22 11:32 AM  Result Value Ref Range   Glucose-Capillary 183 (H) 70 - 99 mg/dL    Comment: Glucose reference range applies only to samples taken after fasting for at least 8 hours.  Glucose, capillary     Status: Abnormal   Collection Time: 11/17/22  4:46 PM  Result Value Ref Range   Glucose-Capillary 108 (H) 70 - 99 mg/dL    Comment: Glucose reference range applies only to samples taken after fasting for at least 8 hours.  Glucose, capillary     Status: Abnormal   Collection Time: 11/17/22  5:42 PM  Result Value Ref Range   Glucose-Capillary 118 (H) 70 - 99 mg/dL    Comment: Glucose reference range applies only to samples taken after fasting for at least 8 hours.  Glucose, capillary     Status: Abnormal   Collection Time: 11/17/22  9:31 PM  Result Value Ref Range   Glucose-Capillary 182 (H) 70 - 99 mg/dL    Comment: Glucose reference range applies only to samples taken after fasting for at least 8 hours.  Basic metabolic panel     Status: Abnormal   Collection Time: 11/18/22  4:29 AM  Result Value Ref Range   Sodium 131 (L) 135 - 145 mmol/L   Potassium 4.2 3.5 - 5.1 mmol/L   Chloride 99 98 - 111 mmol/L   CO2 21 (L) 22 - 32 mmol/L   Glucose, Bld 143 (H) 70 - 99 mg/dL    Comment: Glucose reference range applies only to samples taken after fasting for at least 8 hours.   BUN 33 (H) 8 - 23 mg/dL   Creatinine, Ser 6.06 (H) 0.61 - 1.24 mg/dL   Calcium 8.1 (L) 8.9 - 10.3 mg/dL   GFR, Estimated 37 (L) >60 mL/min    Comment: (NOTE) Calculated using the CKD-EPI Creatinine Equation (2021)    Anion gap 11 5 - 15    Comment: Performed at [facility], [address] [place], [place] [address]  Heparin level (unfractionated)     Status: None   Collection Time: 11/18/22  4:29 AM  Result Value Ref Range   Heparin Unfractionated 0.56 0.30 - 0.70 IU/mL    Comment: (NOTE) The clinical reportable range upper limit is being  lowered to >1.10 to align with the FDA approved guidance for the current laboratory assay.  If heparin results are below expected values, and patient dosage has  been confirmed, suggest follow up testing of antithrombin III levels. Performed at [facility], [address] [place], [place] [address]   CBC     Status: Abnormal   Collection Time: 11/18/22  4:29 AM  Result Value Ref Range   WBC 10.6 (H) 4.0 - 10.5 K/uL   RBC 3.34 (L) 4.22 - 5.81 MIL/uL   Hemoglobin 10.1 (L) 13.0 - 17.0 g/dL   HCT 44.0 (L) 34.7 - 42.5 %   MCV 92.8 80.0 - 100.0 fL   MCH 30.2 26.0 - 34.0 pg   MCHC 32.6 30.0 - 36.0 g/dL   RDW 95.6 38.7 - 56.4 %   Platelets 269 150 - 400 K/uL   nRBC 0.0 0.0 - 0.2 %    Comment: Performed at [facility], [address] [place], [place] [address]  Glucose, capillary     Status: Abnormal   Collection Time: 11/18/22  9:12 AM  Result Value Ref Range   Glucose-Capillary 171 (H) 70 - 99 mg/dL    Comment: Glucose reference range applies only to samples taken after fasting for at least 8 hours.  Glucose, capillary     Status: Abnormal   Collection Time: 11/18/22  1:25 PM  Result Value Ref Range   Glucose-Capillary 141 (H) 70 - 99 mg/dL    Comment: Glucose reference range applies only to samples taken after fasting for at least 8 hours.  Glucose, capillary     Status: Abnormal   Collection Time: 11/18/22  4:31 PM  Result Value Ref Range   Glucose-Capillary 138 (H) 70 - 99 mg/dL    Comment: Glucose reference range applies only to samples taken after fasting for at least 8 hours.  Glucose, capillary     Status: Abnormal   Collection Time: 11/18/22  9:22 PM  Result Value Ref Range   Glucose-Capillary 124 (H) 70 - 99 mg/dL    Comment: Glucose reference range applies only to samples taken after fasting for at least 8 hours.  Heparin level (unfractionated)     Status: None   Collection Time: 11/19/22  6:58 AM  Result Value Ref Range   Heparin  Unfractionated 0.32 0.30 - 0.70 IU/mL    Comment: (NOTE) The clinical reportable range upper limit is being lowered to >1.10 to align with the FDA approved guidance for the current laboratory assay.  If heparin results are below expected values, and patient dosage has  been confirmed, suggest follow up testing of antithrombin III levels. Performed at [facility], [address] [place], [place] [address]   Basic metabolic panel     Status: Abnormal   Collection Time: 11/19/22  6:58 AM  Result Value Ref Range   Sodium 131 (L) 135 - 145 mmol/L   Potassium 4.1 3.5 - 5.1 mmol/L   Chloride 98 98 - 111 mmol/L   CO2 22 22 - 32 mmol/L   Glucose, Bld 133 (H) 70 - 99 mg/dL    Comment: Glucose reference range applies only to samples taken after fasting for at least 8 hours.   BUN 43 (H) 8 - 23 mg/dL   Creatinine, Ser 6.60 (H) 0.61 - 1.24 mg/dL   Calcium 7.9 (L) 8.9 - 10.3 mg/dL   GFR, Estimated 41 (L) >60 mL/min    Comment: (NOTE) Calculated using the CKD-EPI Creatinine Equation (2021)    Anion gap 11 5 - 15    Comment: Performed at [facility], [address] [place], [place] [address]  CBC     Status: Abnormal   Collection Time: 11/19/22  6:58  AM  Result Value Ref Range   WBC 8.2 4.0 - 10.5 K/uL   RBC 2.56 (L) 4.22 - 5.81 MIL/uL   Hemoglobin 7.8 (L) 13.0 - 17.0 g/dL   HCT 93.2 (L) 35.5 - 73.2 %   MCV 89.8 80.0 - 100.0 fL   MCH 30.5 26.0 - 34.0 pg   MCHC 33.9 30.0 - 36.0 g/dL   RDW 20.2 54.2 - 70.6 %   Platelets 273 150 - 400 K/uL   nRBC 0.0 0.0 - 0.2 %    Comment: Performed at [facility], [address] [place], [place] [address]  Glucose, capillary     Status: Abnormal   Collection Time: 11/19/22  7:51 AM  Result Value Ref Range   Glucose-Capillary 133 (H) 70 - 99 mg/dL    Comment: Glucose reference range applies only to samples taken after fasting for at least 8 hours.  Glucose, capillary     Status: Abnormal   Collection Time: 11/19/22   1:35 PM  Result Value Ref Range   Glucose-Capillary 145 (H) 70 - 99 mg/dL    Comment: Glucose reference range applies only to samples taken after fasting for at least 8 hours.  Glucose, capillary     Status: Abnormal   Collection Time: 11/19/22  4:29 PM  Result Value Ref Range   Glucose-Capillary 121 (H) 70 - 99 mg/dL    Comment: Glucose reference range applies only to samples taken after fasting for at least 8 hours.  Glucose, capillary     Status: Abnormal   Collection Time: 11/19/22  8:39 PM  Result Value Ref Range   Glucose-Capillary 144 (H) 70 - 99 mg/dL    Comment: Glucose reference range applies only to samples taken after fasting for at least 8 hours.  Heparin level (unfractionated)     Status: Abnormal   Collection Time: 11/20/22  5:57 AM  Result Value Ref Range   Heparin Unfractionated <0.10 (L) 0.30 - 0.70 IU/mL    Comment: (NOTE) The clinical reportable range upper limit is being lowered to >1.10 to align with the FDA approved guidance for the current laboratory assay.  If heparin results are below expected values, and patient dosage has  been confirmed, suggest follow up testing of antithrombin III levels. Performed at [facility], [address] [place], [place] [address]   Glucose, capillary     Status: Abnormal   Collection Time: 11/20/22  8:58 AM  Result Value Ref Range   Glucose-Capillary 160 (H) 70 - 99 mg/dL    Comment: Glucose reference range applies only to samples taken after fasting for at least 8 hours.  Glucose, capillary     Status: Abnormal   Collection Time: 11/20/22 12:22 PM  Result Value Ref Range   Glucose-Capillary 132 (H) 70 - 99 mg/dL    Comment: Glucose reference range applies only to samples taken after fasting for at least 8 hours.  Basic metabolic panel     Status: Abnormal   Collection Time: 11/20/22 12:25 PM  Result Value Ref Range   Sodium 130 (L) 135 - 145 mmol/L   Potassium 4.6 3.5 - 5.1 mmol/L   Chloride 98 98 - 111  mmol/L   CO2 24 22 - 32 mmol/L   Glucose, Bld 153 (H) 70 - 99 mg/dL    Comment: Glucose reference range applies only to samples taken after fasting for at least 8 hours.   BUN 55 (H) 8 - 23 mg/dL   Creatinine, Ser  1.99 (H) 0.61 - 1.24 mg/dL   Calcium 8.2 (L) 8.9 - 10.3 mg/dL   GFR, Estimated 34 (L) >60 mL/min    Comment: (NOTE) Calculated using the CKD-EPI Creatinine Equation (2021)    Anion gap 8 5 - 15    Comment: Performed at [facility], [address] [place], [place] [address]  Glucose, capillary     Status: Abnormal   Collection Time: 11/20/22  4:21 PM  Result Value Ref Range   Glucose-Capillary 157 (H) 70 - 99 mg/dL    Comment: Glucose reference range applies only to samples taken after fasting for at least 8 hours.  Glucose, capillary     Status: Abnormal   Collection Time: 11/20/22  9:32 PM  Result Value Ref Range   Glucose-Capillary 153 (H) 70 - 99 mg/dL    Comment: Glucose reference range applies only to samples taken after fasting for at least 8 hours.  Basic metabolic panel     Status: Abnormal   Collection Time: 11/21/22  7:20 AM  Result Value Ref Range   Sodium 131 (L) 135 - 145 mmol/L   Potassium 4.1 3.5 - 5.1 mmol/L   Chloride 97 (L) 98 - 111 mmol/L   CO2 21 (L) 22 - 32 mmol/L   Glucose, Bld 143 (H) 70 - 99 mg/dL    Comment: Glucose reference range applies only to samples taken after fasting for at least 8 hours.   BUN 57 (H) 8 - 23 mg/dL   Creatinine, Ser 6.04 (H) 0.61 - 1.24 mg/dL   Calcium 8.2 (L) 8.9 - 10.3 mg/dL   GFR, Estimated 37 (L) >60 mL/min    Comment: (NOTE) Calculated using the CKD-EPI Creatinine Equation (2021)    Anion gap 13 5 - 15    Comment: Performed at [facility], [address] [place], [place] [address]  Glucose, capillary     Status: Abnormal   Collection Time: 11/21/22  7:50 AM  Result Value Ref Range   Glucose-Capillary 132 (H) 70 - 99 mg/dL    Comment: Glucose reference range applies only to samples  taken after fasting for at least 8 hours.  Glucose, capillary     Status: Abnormal   Collection Time: 11/21/22 12:35 PM  Result Value Ref Range   Glucose-Capillary 154 (H) 70 - 99 mg/dL    Comment: Glucose reference range applies only to samples taken after fasting for at least 8 hours.  Glucose, capillary     Status: Abnormal   Collection Time: 11/21/22  5:05 PM  Result Value Ref Range   Glucose-Capillary 186 (H) 70 - 99 mg/dL    Comment: Glucose reference range applies only to samples taken after fasting for at least 8 hours.  Glucose, capillary     Status: Abnormal   Collection Time: 11/21/22 10:11 PM  Result Value Ref Range   Glucose-Capillary 138 (H) 70 - 99 mg/dL    Comment: Glucose reference range applies only to samples taken after fasting for at least 8 hours.  Basic metabolic panel     Status: Abnormal   Collection Time: 11/22/22  5:18 AM  Result Value Ref Range   Sodium 131 (L) 135 - 145 mmol/L   Potassium 4.6 3.5 - 5.1 mmol/L   Chloride 97 (L) 98 - 111 mmol/L   CO2 25 22 - 32 mmol/L   Glucose, Bld 167 (H) 70 - 99 mg/dL    Comment: Glucose reference range applies only to samples taken after fasting  for at least 8 hours.   BUN 55 (H) 8 - 23 mg/dL   Creatinine, Ser 2.13 (H) 0.61 - 1.24 mg/dL   Calcium 8.4 (L) 8.9 - 10.3 mg/dL   GFR, Estimated 40 (L) >60 mL/min    Comment: (NOTE) Calculated using the CKD-EPI Creatinine Equation (2021)    Anion gap 9 5 - 15    Comment: Performed at [facility], [address] [place], [place] [address]  CBC     Status: Abnormal   Collection Time: 11/22/22  5:18 AM  Result Value Ref Range   WBC 8.7 4.0 - 10.5 K/uL   RBC 2.74 (L) 4.22 - 5.81 MIL/uL   Hemoglobin 8.3 (L) 13.0 - 17.0 g/dL   HCT 84.6 (L) 96.2 - 95.2 %   MCV 87.6 80.0 - 100.0 fL   MCH 30.3 26.0 - 34.0 pg   MCHC 34.6 30.0 - 36.0 g/dL   RDW 84.1 32.4 - 40.1 %   Platelets 416 (H) 150 - 400 K/uL   nRBC 0.0 0.0 - 0.2 %    Comment: Performed at [facility], [address] [place], [place] [address]  Glucose, capillary     Status: Abnormal   Collection Time: 11/22/22  7:53 AM  Result Value Ref Range   Glucose-Capillary 148 (H) 70 - 99 mg/dL    Comment: Glucose reference range applies only to samples taken after fasting for at least 8 hours.  Glucose, capillary     Status: Abnormal   Collection Time: 11/22/22 12:20 PM  Result Value Ref Range   Glucose-Capillary 170 (H) 70 - 99 mg/dL    Comment: Glucose reference range applies only to samples taken after fasting for at least 8 hours.  Glucose, capillary     Status: Abnormal   Collection Time: 11/22/22  4:18 PM  Result Value Ref Range   Glucose-Capillary 221 (H) 70 - 99 mg/dL    Comment: Glucose reference range applies only to samples taken after fasting for at least 8 hours.  Glucose, capillary     Status: Abnormal   Collection Time: 11/22/22  9:27 PM  Result Value Ref Range   Glucose-Capillary 212 (H) 70 - 99 mg/dL    Comment: Glucose reference range applies only to samples taken after fasting for at least 8 hours.  Glucose, capillary     Status: Abnormal   Collection Time: 11/23/22  8:03 AM  Result Value Ref Range   Glucose-Capillary 196 (H) 70 - 99 mg/dL    Comment: Glucose reference range applies only to samples taken after fasting for at least 8 hours.  Basic metabolic panel     Status: Abnormal   Collection Time: 11/23/22 10:53 AM  Result Value Ref Range   Sodium 134 (L) 135 - 145 mmol/L   Potassium 4.1 3.5 - 5.1 mmol/L   Chloride 100 98 - 111 mmol/L   CO2 24 22 - 32 mmol/L   Glucose, Bld 212 (H) 70 - 99 mg/dL    Comment: Glucose reference range applies only to samples taken after fasting for at least 8 hours.   BUN 39 (H) 8 - 23 mg/dL   Creatinine, Ser 3.66 0.61 - 1.24 mg/dL   Calcium 8.7 (L) 8.9 - 10.3 mg/dL   GFR, Estimated >44 >03 mL/min    Comment: (NOTE) Calculated using the CKD-EPI Creatinine Equation (2021)    Anion gap 10 5 - 15    Comment: Performed at  [facility], [address] [place], [place] [address]  CBC     Status: Abnormal   Collection Time: 11/23/22 10:53 AM  Result Value Ref Range   WBC 9.1 4.0 - 10.5 K/uL   RBC 2.98 (L) 4.22 - 5.81 MIL/uL   Hemoglobin 8.9 (L) 13.0 - 17.0 g/dL   HCT 19.1 (L) 47.8 - 29.5 %   MCV 89.6 80.0 - 100.0 fL   MCH 29.9 26.0 - 34.0 pg   MCHC 33.3 30.0 - 36.0 g/dL   RDW 62.1 30.8 - 65.7 %   Platelets 458 (H) 150 - 400 K/uL   nRBC 0.0 0.0 - 0.2 %    Comment: Performed at [facility], [address] [place], [place] [address]  Magnesium     Status: None   Collection Time: 11/23/22 10:53 AM  Result Value Ref Range   Magnesium 2.3 1.7 - 2.4 mg/dL    Comment: Performed at [facility], [address] [place], [place] [address]  Glucose, capillary     Status: Abnormal   Collection Time: 11/23/22 12:06 PM  Result Value Ref Range   Glucose-Capillary 192 (H) 70 - 99 mg/dL    Comment: Glucose reference range applies only to samples taken after fasting for at least 8 hours.  Glucose, capillary     Status: Abnormal   Collection Time: 11/23/22  5:04 PM  Result Value Ref Range   Glucose-Capillary 199 (H) 70 - 99 mg/dL    Comment: Glucose reference range applies only to samples taken after fasting for at least 8 hours.  Glucose, capillary     Status: Abnormal   Collection Time: 11/23/22  9:16 PM  Result Value Ref Range   Glucose-Capillary 256 (H) 70 - 99 mg/dL    Comment: Glucose reference range applies only to samples taken after fasting for at least 8 hours.  Basic metabolic panel     Status: Abnormal   Collection Time: 11/24/22  6:40 AM  Result Value Ref Range   Sodium 135 135 - 145 mmol/L   Potassium 3.7 3.5 - 5.1 mmol/L   Chloride 100 98 - 111 mmol/L   CO2 24 22 - 32 mmol/L   Glucose, Bld 194 (H) 70 - 99 mg/dL    Comment: Glucose reference range applies only to samples taken after fasting for at least 8 hours.   BUN 28 (H) 8 - 23 mg/dL   Creatinine, Ser 4.13 0.61  - 1.24 mg/dL   Calcium 8.5 (L) 8.9 - 10.3 mg/dL   GFR, Estimated >24 >40 mL/min    Comment: (NOTE) Calculated using the CKD-EPI Creatinine Equation (2021)    Anion gap 11 5 - 15    Comment: Performed at [facility], [address] [place], [place] [address]  CBC     Status: Abnormal   Collection Time: 11/24/22  6:40 AM  Result Value Ref Range   WBC 8.7 4.0 - 10.5 K/uL   RBC 2.90 (L) 4.22 - 5.81 MIL/uL   Hemoglobin 8.7 (L) 13.0 - 17.0 g/dL   HCT 53.6 (L) 64.4 - 03.4 %   MCV 90.0 80.0 - 100.0 fL   MCH 30.0 26.0 - 34.0 pg   MCHC 33.3 30.0 - 36.0 g/dL   RDW 74.2 59.5 - 63.8 %   Platelets 459 (H) 150 - 400 K/uL   nRBC 0.0 0.0 - 0.2 %    Comment: Performed at [facility], [address], [place], [place] [address]  Glucose, capillary  Status: Abnormal   Collection Time: 11/24/22  8:29 AM  Result Value Ref Range   Glucose-Capillary 185 (H) 70 - 99 mg/dL    Comment: Glucose reference range applies only to samples taken after fasting for at least 8 hours.  Glucose, capillary     Status: Abnormal   Collection Time: 11/24/22 11:39 AM  Result Value Ref Range   Glucose-Capillary 307 (H) 70 - 99 mg/dL    Comment: Glucose reference range applies only to samples taken after fasting for at least 8 hours.  Glucose, capillary     Status: Abnormal   Collection Time: 11/24/22  4:24 PM  Result Value Ref Range   Glucose-Capillary 133 (H) 70 - 99 mg/dL    Comment: Glucose reference range applies only to samples taken after fasting for at least 8 hours.  Glucose, capillary     Status: Abnormal   Collection Time: 11/24/22  7:53 PM  Result Value Ref Range   Glucose-Capillary 200 (H) 70 - 99 mg/dL    Comment: Glucose reference range applies only to samples taken after fasting for at least 8 hours.  Magnesium     Status: None   Collection Time: 11/25/22  5:29 AM  Result Value Ref Range   Magnesium 2.0 1.7 - 2.4 mg/dL    Comment: Performed at [facility], [address] [place], [place] [address]  Basic metabolic panel     Status: Abnormal   Collection Time: 11/25/22  5:29 AM  Result Value Ref Range   Sodium 136 135 - 145 mmol/L   Potassium 3.8 3.5 - 5.1 mmol/L   Chloride 99 98 - 111 mmol/L   CO2 25 22 - 32 mmol/L   Glucose, Bld 199 (H) 70 - 99 mg/dL    Comment: Glucose reference range applies only to samples taken after fasting for at least 8 hours.   BUN 25 (H) 8 - 23 mg/dL   Creatinine, Ser 7.25 0.61 - 1.24 mg/dL   Calcium 8.7 (L) 8.9 - 10.3 mg/dL   GFR, Estimated >36 >64 mL/min    Comment: (NOTE) Calculated using the CKD-EPI Creatinine Equation (2021)    Anion gap 12 5 - 15    Comment: Performed at [facility], [address] [place], [place] [address]  Glucose, capillary     Status: Abnormal   Collection Time: 11/25/22  7:55 AM  Result Value Ref Range   Glucose-Capillary 206 (H) 70 - 99 mg/dL    Comment: Glucose reference range applies only to samples taken after fasting for at least 8 hours.   Comment 1 Notify RN   Glucose, capillary     Status: Abnormal   Collection Time: 11/25/22 12:03 PM  Result Value Ref Range   Glucose-Capillary 203 (H) 70 - 99 mg/dL    Comment: Glucose reference range applies only to samples taken after fasting for at least 8 hours.   Comment 1 Notify RN   Glucose, capillary     Status: Abnormal   Collection Time: 11/25/22  4:22 PM  Result Value Ref Range   Glucose-Capillary 226 (H) 70 - 99 mg/dL    Comment: Glucose reference range applies only to samples taken after fasting for at least 8 hours.  Glucose, capillary     Status: Abnormal   Collection Time: 11/25/22  8:58 PM  Result Value Ref Range   Glucose-Capillary 194 (H) 70 - 99 mg/dL    Comment: Glucose reference range applies only to samples taken after  fasting for at least 8 hours.  Basic metabolic panel     Status: Abnormal   Collection Time: 11/26/22  4:38 AM  Result Value Ref Range   Sodium 133 (L) 135 - 145 mmol/L   Potassium  4.0 3.5 - 5.1 mmol/L   Chloride 97 (L) 98 - 111 mmol/L   CO2 28 22 - 32 mmol/L   Glucose, Bld 225 (H) 70 - 99 mg/dL    Comment: Glucose reference range applies only to samples taken after fasting for at least 8 hours.   BUN 27 (H) 8 - 23 mg/dL   Creatinine, Ser 1.19 0.61 - 1.24 mg/dL   Calcium 8.7 (L) 8.9 - 10.3 mg/dL   GFR, Estimated >14 >78 mL/min    Comment: (NOTE) Calculated using the CKD-EPI Creatinine Equation (2021)    Anion gap 8 5 - 15    Comment: Performed at [facility], [address] [place], [place] [address]  Glucose, capillary     Status: Abnormal   Collection Time: 11/26/22  8:13 AM  Result Value Ref Range   Glucose-Capillary 259 (H) 70 - 99 mg/dL    Comment: Glucose reference range applies only to samples taken after fasting for at least 8 hours.  Glucose, capillary     Status: Abnormal   Collection Time: 11/26/22 11:34 AM  Result Value Ref Range   Glucose-Capillary 180 (H) 70 - 99 mg/dL    Comment: Glucose reference range applies only to samples taken after fasting for at least 8 hours.  VAS [place] ABI WITH/WO TBI     Status: None   Collection Time: 01/01/23  3:00 PM  Result Value Ref Range   Right ABI 0.67    Left ABI 0.82   Type and screen     Status: None   Collection Time: 01/07/23  8:56 AM  Result Value Ref Range   ABO/RH(D) A POS    Antibody Screen NEG    Sample Expiration 01/21/2023,2359    Extend sample reason      NO TRANSFUSIONS OR PREGNANCY IN THE PAST 3 MONTHS Performed at [facility], [address] [place], [place] [address]   Basic metabolic panel per protocol     Status: Abnormal   Collection Time: 01/07/23  9:56 AM  Result Value Ref Range   Sodium 133 (L) 135 - 145 mmol/L   Potassium 3.7 3.5 - 5.1 mmol/L   Chloride 95 (L) 98 - 111 mmol/L   CO2 26 22 - 32 mmol/L   Glucose, Bld 121 (H) 70 - 99 mg/dL    Comment: Glucose reference range applies only to samples taken after fasting for at least 8 hours.   BUN 17 8 - 23  mg/dL   Creatinine, Ser 5.78 0.61 - 1.24 mg/dL   Calcium 9.2 8.9 - 46.9 mg/dL   GFR, Estimated >62 >95 mL/min    Comment: (NOTE) Calculated using the CKD-EPI Creatinine Equation (2021)    Anion gap 12 5 - 15    Comment: Performed at [facility], [address] [place], [place] [address]  CBC with Differential/Platelet     Status: Abnormal   Collection Time: 01/07/23  9:56 AM  Result Value Ref Range   WBC 11.9 (H) 4.0 - 10.5 K/uL   RBC 4.59 4.22 - 5.81 MIL/uL   Hemoglobin 13.0 13.0 - 17.0 g/dL   HCT 24.4 (L) 01.0 - 27.2 %   MCV 84.5 80.0 - 100.0 fL   MCH 28.3  26.0 - 34.0 pg   MCHC 33.5 30.0 - 36.0 g/dL   RDW 08.6 57.8 - 46.9 %   Platelets 383 150 - 400 K/uL   nRBC 0.0 0.0 - 0.2 %   Neutrophils Relative % 73 %   Neutro Abs 8.6 (H) 1.7 - 7.7 K/uL   Lymphocytes Relative 15 %   Lymphs Abs 1.8 0.7 - 4.0 K/uL   Monocytes Relative 9 %   Monocytes Absolute 1.0 0.1 - 1.0 K/uL   Eosinophils Relative 3 %   Eosinophils Absolute 0.4 0.0 - 0.5 K/uL   Basophils Relative 0 %   Basophils Absolute 0.0 0.0 - 0.1 K/uL   Immature Granulocytes 0 %   Abs Immature Granulocytes 0.04 0.00 - 0.07 K/uL    Comment: Performed at [facility], [address] [place], [place] [address]  Glucose, capillary     Status: Abnormal   Collection Time: 01/09/23  8:00 AM  Result Value Ref Range   Glucose-Capillary 118 (H) 70 - 99 mg/dL    Comment: Glucose reference range applies only to samples taken after fasting for at least 8 hours.  Glucose, capillary     Status: Abnormal   Collection Time: 01/09/23  9:46 AM  Result Value Ref Range   Glucose-Capillary 102 (H) 70 - 99 mg/dL    Comment: Glucose reference range applies only to samples taken after fasting for at least 8 hours.  CBC with Differential     Status: Abnormal   Collection Time: 01/15/23  7:54 AM  Result Value Ref Range   WBC 14.0 (H) 4.0 - 10.5 K/uL   RBC 4.20 (L) 4.22 - 5.81 MIL/uL   Hemoglobin 11.7 (L) 13.0 - 17.0 g/dL    HCT 84.1 (L) 32.4 - 52.0 %   MCV 85.0 80.0 - 100.0 fL   MCH 27.9 26.0 - 34.0 pg   MCHC 32.8 30.0 - 36.0 g/dL   RDW 40.1 02.7 - 25.3 %   Platelets 345 150 - 400 K/uL   nRBC 0.0 0.0 - 0.2 %   Neutrophils Relative % 84 %   Neutro Abs 11.7 (H) 1.7 - 7.7 K/uL   Lymphocytes Relative 8 %   Lymphs Abs 1.1 0.7 - 4.0 K/uL   Monocytes Relative 7 %   Monocytes Absolute 1.0 0.1 - 1.0 K/uL   Eosinophils Relative 0 %   Eosinophils Absolute 0.1 0.0 - 0.5 K/uL   Basophils Relative 0 %   Basophils Absolute 0.1 0.0 - 0.1 K/uL   Immature Granulocytes 1 %   Abs Immature Granulocytes 0.10 (H) 0.00 - 0.07 K/uL    Comment: Performed at [facility], [address] [place], [place] [address]  Comprehensive metabolic panel     Status: Abnormal   Collection Time: 01/15/23  7:54 AM  Result Value Ref Range   Sodium 129 (L) 135 - 145 mmol/L   Potassium 4.5 3.5 - 5.1 mmol/L   Chloride 97 (L) 98 - 111 mmol/L   CO2 23 22 - 32 mmol/L   Glucose, Bld 187 (H) 70 - 99 mg/dL    Comment: Glucose reference range applies only to samples taken after fasting for at least 8 hours.   BUN 23 8 - 23 mg/dL   Creatinine, Ser 3.47 0.61 - 1.24 mg/dL   Calcium 8.5 (L) 8.9 - 10.3 mg/dL   Total Protein 6.5 6.5 - 8.1 g/dL   Albumin 3.1 (L) 3.5 - 5.0 g/dL   AST 18 15 - 41  U/L   ALT 12 0 - 44 U/L   Alkaline Phosphatase 59 38 - 126 U/L   Total Bilirubin 1.2 0.3 - 1.2 mg/dL   GFR, Estimated >47 >82 mL/min    Comment: (NOTE) Calculated using the CKD-EPI Creatinine Equation (2021)    Anion gap 9 5 - 15    Comment: Performed at [facility], [address] [place], [place] [address]  Troponin I (High Sensitivity)     Status: Abnormal   Collection Time: 01/15/23  7:54 AM  Result Value Ref Range   Troponin I (High Sensitivity) 431 (HH) <18 ng/L    Comment: CRITICAL RESULT CALLED TO, READ BACK BY AND VERIFIED WITH LISA GEISLER 01/15/23 0839 [facility] (NOTE) Elevated high sensitivity troponin I (hsTnI) values and  significant  changes across serial measurements may suggest ACS but many other  chronic and acute conditions are known to elevate hsTnI results.  Refer to the "Links" section for chest pain algorithms and additional  guidance. Performed at [facility], [address] [place], [place] [address]   Brain natriuretic peptide     Status: Abnormal   Collection Time: 01/15/23  7:54 AM  Result Value Ref Range   B Natriuretic Peptide 1,760.5 (H) 0.0 - 100.0 pg/mL    Comment: Performed at [facility], [address] [place], [place] [address]  Lactic acid, plasma     Status: None   Collection Time: 01/15/23  7:54 AM  Result Value Ref Range   Lactic Acid, Venous 1.4 0.5 - 1.9 mmol/L    Comment: Performed at [facility], [address] [place], [place] [address]  Lactic acid, plasma     Status: None   Collection Time: 01/15/23 10:11 AM  Result Value Ref Range   Lactic Acid, Venous 1.3 0.5 - 1.9 mmol/L    Comment: Performed at [facility], [address] [place], [place] [address]  Troponin I (High Sensitivity)     Status: Abnormal   Collection Time: 01/15/23 10:11 AM  Result Value Ref Range   Troponin I (High Sensitivity) 475 (HH) <18 ng/L    Comment: CRITICAL VALUE NOTED. VALUE IS CONSISTENT WITH PREVIOUSLY REPORTED/CALLED VALUE [facility] (NOTE) Elevated high sensitivity troponin I (hsTnI) values and significant  changes across serial measurements may suggest ACS but many other  chronic and acute conditions are known to elevate hsTnI results.  Refer to the "Links" section for chest pain algorithms and additional  guidance. Performed at [facility], [address] [place], [place] [address]   APTT     Status: None   Collection Time: 01/15/23 10:11 AM  Result Value Ref Range   aPTT 32 24 - 36 seconds    Comment: Performed at [facility], [address] [place], [place] [address]  Protime-INR     Status: Abnormal    Collection Time: 01/15/23 10:11 AM  Result Value Ref Range   Prothrombin Time 16.0 (H) 11.4 - 15.2 seconds   INR 1.3 (H) 0.8 - 1.2    Comment: (NOTE) INR goal varies based on device and disease states. Performed at [facility], [address] [place], [place] [address]   CBG monitoring, ED     Status: Abnormal   Collection Time: 01/15/23 12:47 PM  Result Value Ref Range   Glucose-Capillary 162 (H) 70 - 99 mg/dL    Comment: Glucose reference range applies only to samples taken after fasting for at least 8 hours.  CBG  monitoring, ED     Status: Abnormal   Collection Time: 01/15/23  5:54 PM  Result Value Ref Range   Glucose-Capillary 217 (H) 70 - 99 mg/dL    Comment: Glucose reference range applies only to samples taken after fasting for at least 8 hours.  Heparin level (unfractionated)     Status: Abnormal   Collection Time: 01/15/23  7:05 PM  Result Value Ref Range   Heparin Unfractionated <0.10 (L) 0.30 - 0.70 IU/mL    Comment: REPEATED TO VERIFY Performed at [facility], [address] [place], [place] [address]   CBG monitoring, ED     Status: Abnormal   Collection Time: 01/15/23  9:48 PM  Result Value Ref Range   Glucose-Capillary 134 (H) 70 - 99 mg/dL    Comment: Glucose reference range applies only to samples taken after fasting for at least 8 hours.  CBC     Status: Abnormal   Collection Time: 01/16/23  1:51 AM  Result Value Ref Range   WBC 13.5 (H) 4.0 - 10.5 K/uL   RBC 3.89 (L) 4.22 - 5.81 MIL/uL   Hemoglobin 11.1 (L) 13.0 - 17.0 g/dL   HCT 60.4 (L) 54.0 - 98.1 %   MCV 84.6 80.0 - 100.0 fL   MCH 28.5 26.0 - 34.0 pg   MCHC 33.7 30.0 - 36.0 g/dL   RDW 19.1 47.8 - 29.5 %   Platelets 292 150 - 400 K/uL   nRBC 0.0 0.0 - 0.2 %    Comment: Performed at [facility], [address] [place], [place] [address]  Comprehensive metabolic panel     Status: Abnormal   Collection Time: 01/16/23  1:51 AM  Result Value Ref Range   Sodium 128  (L) 135 - 145 mmol/L   Potassium 4.2 3.5 - 5.1 mmol/L   Chloride 94 (L) 98 - 111 mmol/L   CO2 24 22 - 32 mmol/L   Glucose, Bld 176 (H) 70 - 99 mg/dL    Comment: Glucose reference range applies only to samples taken after fasting for at least 8 hours.   BUN 29 (H) 8 - 23 mg/dL   Creatinine, Ser 8.65 0.61 - 1.24 mg/dL   Calcium 8.3 (L) 8.9 - 10.3 mg/dL   Total Protein 6.6 6.5 - 8.1 g/dL   Albumin 2.9 (L) 3.5 - 5.0 g/dL   AST 22 15 - 41 U/L   ALT 17 0 - 44 U/L   Alkaline Phosphatase 61 38 - 126 U/L   Total Bilirubin 0.9 0.3 - 1.2 mg/dL   GFR, Estimated >78 >46 mL/min    Comment: (NOTE) Calculated using the CKD-EPI Creatinine Equation (2021)    Anion gap 10 5 - 15    Comment: Performed at [facility], [address] [place], [place] [address]  Magnesium     Status: None   Collection Time: 01/16/23  1:51 AM  Result Value Ref Range   Magnesium 1.9 1.7 - 2.4 mg/dL    Comment: Performed at [facility], [address] [place], [place] [address]  Iron and TIBC     Status: Abnormal   Collection Time: 01/16/23  1:51 AM  Result Value Ref Range   Iron 17 (L) 45 - 182 ug/dL   TIBC 244 (L) 010 - 272 ug/dL   Saturation Ratios 7 (L) 17.9 - 39.5 %   UIBC 228 ug/dL    Comment: Performed at [facility], [address] [place], [place]  [address]  Folate     Status: None   Collection Time: 01/16/23  1:51 AM  Result Value Ref Range   Folate 16.1 >5.9 ng/mL    Comment: Performed at [facility], [address] [place], [place] [address]  Heparin level (unfractionated)     Status: Abnormal   Collection Time: 01/16/23  6:11 AM  Result Value Ref Range   Heparin Unfractionated <0.10 (L) 0.30 - 0.70 IU/mL    Comment: (NOTE) The clinical reportable range upper limit is being lowered to >1.10 to align with the FDA approved guidance for the current laboratory assay.  If heparin results are below expected values, and patient dosage has  been confirmed,  suggest follow up testing of antithrombin III levels. Performed at [facility], [address] [place], [place] [address]   Osmolality     Status: None   Collection Time: 01/16/23  7:48 AM  Result Value Ref Range   Osmolality 282 275 - 295 mOsm/kg    Comment: REPEATED TO VERIFY Performed at [facility], [address] [place], [place] [address]   Vitamin B12     Status: None   Collection Time: 01/16/23  7:48 AM  Result Value Ref Range   Vitamin B-12 428 180 - 914 pg/mL    Comment: (NOTE) This assay is not validated for testing neonatal or myeloproliferative syndrome specimens for Vitamin B12 levels. Performed at [facility], [address] [place], [place] [address]   VITAMIN D 25 Hydroxy (Vit-D Deficiency, Fractures)     Status: None   Collection Time: 01/16/23  7:48 AM  Result Value Ref Range   Vit D, 25-Hydroxy 31.27 30 - 100 ng/mL    Comment: (NOTE) Vitamin D deficiency has been defined by the Institute of Medicine  and an Endocrine Society practice guideline as a level of serum 25-OH  vitamin D less than 20 ng/mL (1,2). The Endocrine Society went on to  further define vitamin D insufficiency as a level between 21 and 29  ng/mL (2).  1. IOM (Institute of Medicine). 2010. Dietary reference intakes for  calcium and D. [place]: [employer]. 2. Holick MF, Binkley [place], Bischoff-Ferrari HA, et al. Evaluation,  treatment, and prevention of vitamin D deficiency: an Endocrine  Society clinical practice guideline, JCEM. 2011 Jul; 96(7): 1911-30.  Performed at [facility], [address] [place], [place] [address]   Glucose, capillary     Status: Abnormal   Collection Time: 01/16/23  7:56 AM  Result Value Ref Range   Glucose-Capillary 142 (H) 70 - 99 mg/dL    Comment: Glucose reference range applies only to samples taken after fasting for at least 8 hours.  Glucose, capillary     Status: Abnormal   Collection Time:  01/16/23 12:19 PM  Result Value Ref Range   Glucose-Capillary 188 (H) 70 - 99 mg/dL    Comment: Glucose reference range applies only to samples taken after fasting for at least 8 hours.  Glucose, capillary     Status: Abnormal   Collection Time: 01/16/23  3:56 PM  Result Value Ref Range   Glucose-Capillary 102 (H) 70 - 99 mg/dL    Comment: Glucose reference range applies only to samples taken after fasting for at least 8 hours.  Glucose, capillary     Status: Abnormal   Collection Time: 01/16/23  7:26 PM  Result Value Ref Range   Glucose-Capillary 160 (H) 70 - 99 mg/dL  Comment: Glucose reference range applies only to samples taken after fasting for at least 8 hours.  Basic metabolic panel     Status: Abnormal   Collection Time: 01/17/23  4:51 AM  Result Value Ref Range   Sodium 130 (L) 135 - 145 mmol/L   Potassium 3.8 3.5 - 5.1 mmol/L   Chloride 95 (L) 98 - 111 mmol/L   CO2 24 22 - 32 mmol/L   Glucose, Bld 157 (H) 70 - 99 mg/dL    Comment: Glucose reference range applies only to samples taken after fasting for at least 8 hours.   BUN 34 (H) 8 - 23 mg/dL   Creatinine, Ser 1.61 0.61 - 1.24 mg/dL   Calcium 8.4 (L) 8.9 - 10.3 mg/dL   GFR, Estimated >09 >60 mL/min    Comment: (NOTE) Calculated using the CKD-EPI Creatinine Equation (2021)    Anion gap 11 5 - 15    Comment: Performed at [facility], [address] [place], [place] [address]  CBC     Status: Abnormal   Collection Time: 01/17/23  4:51 AM  Result Value Ref Range   WBC 10.7 (H) 4.0 - 10.5 K/uL   RBC 3.73 (L) 4.22 - 5.81 MIL/uL   Hemoglobin 10.4 (L) 13.0 - 17.0 g/dL   HCT 81.1 (L) 91.4 - 78.2 %   MCV 81.8 80.0 - 100.0 fL   MCH 27.9 26.0 - 34.0 pg   MCHC 34.1 30.0 - 36.0 g/dL   RDW 95.6 21.3 - 08.6 %   Platelets 301 150 - 400 K/uL   nRBC 0.0 0.0 - 0.2 %    Comment: Performed at [facility], [address], [place], [place] [address]  Magnesium     Status: None   Collection Time: 01/17/23   4:51 AM  Result Value Ref Range   Magnesium 2.0 1.7 - 2.4 mg/dL    Comment: Performed at [facility], [address], [place], [place] [address]  Phosphorus     Status: None   Collection Time: 01/17/23  4:51 AM  Result Value Ref Range   Phosphorus 3.5 2.5 - 4.6 mg/dL    Comment: Performed at [facility], [address] [place], [place] [address]  Glucose, capillary     Status: Abnormal   Collection Time: 01/17/23  8:24 AM  Result Value Ref Range   Glucose-Capillary 150 (H) 70 - 99 mg/dL    Comment: Glucose reference range applies only to samples taken after fasting for at least 8 hours.  Glucose, capillary     Status: Abnormal   Collection Time: 01/17/23 11:22 AM  Result Value Ref Range   Glucose-Capillary 207 (H) 70 - 99 mg/dL    Comment: Glucose reference range applies only to samples taken after fasting for at least 8 hours.  Glucose, capillary     Status: Abnormal   Collection Time: 01/17/23  3:25 PM  Result Value Ref Range   Glucose-Capillary 111 (H) 70 - 99 mg/dL    Comment: Glucose reference range applies only to samples taken after fasting for at least 8 hours.  Glucose, capillary     Status: Abnormal   Collection Time: 01/17/23  8:01 PM  Result Value Ref Range   Glucose-Capillary 208 (H) 70 - 99 mg/dL    Comment: Glucose reference range applies only to samples taken after fasting for at least 8 hours.  Basic metabolic panel     Status: Abnormal   Collection Time: 01/18/23  4:57 AM  Result Value Ref Range   Sodium 133 (L) 135 - 145 mmol/L   Potassium 4.0 3.5 - 5.1 mmol/L   Chloride 97 (L) 98 - 111 mmol/L   CO2 28 22 - 32 mmol/L   Glucose, Bld 200 (H) 70 - 99 mg/dL    Comment: Glucose reference range applies only to samples taken after fasting for at least 8 hours.   BUN 31 (H) 8 - 23 mg/dL   Creatinine, Ser 5.18 0.61 - 1.24 mg/dL   Calcium 8.8 (L) 8.9 - 10.3 mg/dL   GFR, Estimated >84 >16 mL/min    Comment: (NOTE) Calculated using the CKD-EPI  Creatinine Equation (2021)    Anion gap 8 5 - 15    Comment: Performed at [facility], [address] [place], [place] [address]  CBC     Status: Abnormal   Collection Time: 01/18/23  4:57 AM  Result Value Ref Range   WBC 10.8 (H) 4.0 - 10.5 K/uL   RBC 4.11 (L) 4.22 - 5.81 MIL/uL   Hemoglobin 11.6 (L) 13.0 - 17.0 g/dL   HCT 16.0 (L) 10.9 - 32.3 %   MCV 82.7 80.0 - 100.0 fL   MCH 28.2 26.0 - 34.0 pg   MCHC 34.1 30.0 - 36.0 g/dL   RDW 55.7 32.2 - 02.5 %   Platelets 372 150 - 400 K/uL   nRBC 0.0 0.0 - 0.2 %    Comment: Performed at [facility], [address], [place], [place] [address]  Magnesium     Status: None   Collection Time: 01/18/23  4:57 AM  Result Value Ref Range   Magnesium 2.2 1.7 - 2.4 mg/dL    Comment: Performed at [facility], [address], [place], [place] [address]  Phosphorus     Status: None   Collection Time: 01/18/23  4:57 AM  Result Value Ref Range   Phosphorus 4.1 2.5 - 4.6 mg/dL    Comment: Performed at [facility], [address] [place], [place] [address]  Glucose, capillary     Status: Abnormal   Collection Time: 01/18/23  8:15 AM  Result Value Ref Range   Glucose-Capillary 190 (H) 70 - 99 mg/dL    Comment: Glucose reference range applies only to samples taken after fasting for at least 8 hours.  Glucose, capillary     Status: Abnormal   Collection Time: 01/18/23 12:25 PM  Result Value Ref Range   Glucose-Capillary 160 (H) 70 - 99 mg/dL    Comment: Glucose reference range applies only to samples taken after fasting for at least 8 hours.  Glucose, capillary     Status: Abnormal   Collection Time: 01/18/23  5:21 PM  Result Value Ref Range   Glucose-Capillary 149 (H) 70 - 99 mg/dL    Comment: Glucose reference range applies only to samples taken after fasting for at least 8 hours.  Glucose, capillary     Status: Abnormal   Collection Time: 01/18/23 10:08 PM  Result Value Ref Range   Glucose-Capillary  147 (H) 70 - 99 mg/dL    Comment: Glucose reference range applies only to samples taken after fasting for at least 8 hours.  Basic metabolic panel     Status: Abnormal   Collection Time: 01/19/23  5:51 AM  Result Value Ref Range   Sodium 133 (L) 135 - 145 mmol/L   Potassium 3.5 3.5 - 5.1 mmol/L   Chloride 98 98 - 111 mmol/L  CO2 25 22 - 32 mmol/L   Glucose, Bld 133 (H) 70 - 99 mg/dL    Comment: Glucose reference range applies only to samples taken after fasting for at least 8 hours.   BUN 26 (H) 8 - 23 mg/dL   Creatinine, Ser 6.29 0.61 - 1.24 mg/dL   Calcium 8.7 (L) 8.9 - 10.3 mg/dL   GFR, Estimated >52 >84 mL/min    Comment: (NOTE) Calculated using the CKD-EPI Creatinine Equation (2021)    Anion gap 10 5 - 15    Comment: Performed at [facility], [address] [place], [place] [address]  CBC     Status: Abnormal   Collection Time: 01/19/23  5:51 AM  Result Value Ref Range   WBC 10.6 (H) 4.0 - 10.5 K/uL   RBC 3.95 (L) 4.22 - 5.81 MIL/uL   Hemoglobin 11.0 (L) 13.0 - 17.0 g/dL   HCT 01.0 (L) 27.2 - 53.6 %   MCV 83.0 80.0 - 100.0 fL   MCH 27.8 26.0 - 34.0 pg   MCHC 33.5 30.0 - 36.0 g/dL   RDW 64.4 03.4 - 74.2 %   Platelets 353 150 - 400 K/uL   nRBC 0.0 0.0 - 0.2 %    Comment: Performed at [facility], [address], [place], [place] [address]  Magnesium     Status: None   Collection Time: 01/19/23  5:51 AM  Result Value Ref Range   Magnesium 2.0 1.7 - 2.4 mg/dL    Comment: Performed at [facility], [address] [place], [place] [address]  Phosphorus     Status: None   Collection Time: 01/19/23  5:51 AM  Result Value Ref Range   Phosphorus 4.0 2.5 - 4.6 mg/dL    Comment: Performed at [facility], [address] [place], [place] [address]  Glucose, capillary     Status: Abnormal   Collection Time: 01/19/23  7:57 AM  Result Value Ref Range   Glucose-Capillary 125 (H) 70 - 99 mg/dL    Comment: Glucose reference range applies  only to samples taken after fasting for at least 8 hours.  Glucose, capillary     Status: Abnormal   Collection Time: 01/19/23 11:54 AM  Result Value Ref Range   Glucose-Capillary 213 (H) 70 - 99 mg/dL    Comment: Glucose reference range applies only to samples taken after fasting for at least 8 hours.    Radiology No results found.  Assessment/Plan  Hypertension blood pressure control important in reducing the progression of atherosclerotic disease. On appropriate oral medications.   Diabetic polyneuropathy associated with type 2 diabetes mellitus (HCC) blood glucose control important in reducing the progression of atherosclerotic disease. Also, involved in wound healing. On appropriate medications.  His neuropathy is not well-maintained with gabapentin so I am going to add Lyrica today in hopes of getting his neuropathy pain better as well as getting him more sleep at night.   Mixed hyperlipidemia lipid control important in reducing the progression of atherosclerotic disease. Continue statin therapy   Atherosclerosis of native arteries of the extremities with ulceration (HCC) His wound is almost completely healed.  We can take the [facility] off now and just go with a dry dressing.  The patient is having severe neuropathic pain of the lower extremity so I am adding Lyrica.  Follow-up in 4 to 6 weeks for wound check and we can check his perfusion again in 3 to 4 months.    Festus Barren,  MD  02/14/2023 10:58 AM    This note was created with Dragon medical transcription system.  Any errors from dictation are purely unintentional

## 2023-02-14 NOTE — Assessment & Plan Note (Addendum)
blood glucose control important in reducing the progression of atherosclerotic disease. Also, involved in wound healing. On appropriate medications.  His neuropathy is not well-maintained with gabapentin so I am going to add Lyrica today in hopes of getting his neuropathy pain better as well as getting him more sleep at night.

## 2023-02-14 NOTE — ED Triage Notes (Signed)
Pt here with weakness. Pt states he has loss 20 lbs recently and he just does not feel well. Pt having neuropathy pain in his right leg. Pt denies fever.

## 2023-02-14 NOTE — Assessment & Plan Note (Signed)
-   Continue home regimen 

## 2023-02-14 NOTE — Progress Notes (Signed)
Pharmacy Antibiotic Note  Manuel [name] is a 77 y.o. male presenting on 02/14/2023 with generalized weakness and worsening pain of wound on right foot. PMH significant for CAD w/ STEMI s/p DES x2 (2011), HTN, T2DM, and PAD. 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 Patient remains afebrile with WBC 10.1. Pharmacy has been consulted for vancomycin and cefepime dosing.  Plan: Vancomycin load of 1750 mg x1 given in ED Start vancomycin 750 mg IV every 12 hours (eAUC 469.6, Scr 0.96, IBW, Vd 0.72, Cmin 14.6) Start cefepime 2 g IV every 8 hours Monitor renal function, clinical status, culture data, and LOT    Temp (24hrs), Avg:98 F (36.7 C), Min:97.9 F (36.6 C), Max:98 F (36.7 C)  Recent Labs  Lab 02/14/23 1157  WBC 10.8*  CREATININE 0.96    Estimated Creatinine Clearance: 60.4 mL/min (by C-G formula based on SCr of 0.96 mg/dL).    No Known Allergies  Antimicrobials this admission: Vancomycin 10/25 >>  Cefepime 10/25 >>   Dose adjustments this admission: N/A  Microbiology results: 10/25 BCx: pending  Thank you for involving pharmacy in this patient's care.   Rockwell Alexandria, PharmD Clinical Pharmacist 02/14/2023 9:46 PM

## 2023-02-14 NOTE — Assessment & Plan Note (Signed)
Patient states that he drinks several beers a day in addition to liquor.  On previous admissions, no evidence of alcohol withdrawal.  He realizes that he needs to discontinue his alcohol use but has been struggling with it.  - CIWA monitoring without Ativan - Daily multi vitamin folic acid and thiamine

## 2023-02-14 NOTE — ED Notes (Signed)
Pt taken to MRI at this time and will then be transported to inpatient room

## 2023-02-15 ENCOUNTER — Inpatient Hospital Stay: Payer: Medicare Other

## 2023-02-15 DIAGNOSIS — E119 Type 2 diabetes mellitus without complications: Secondary | ICD-10-CM

## 2023-02-15 DIAGNOSIS — E11621 Type 2 diabetes mellitus with foot ulcer: Secondary | ICD-10-CM | POA: Diagnosis not present

## 2023-02-15 DIAGNOSIS — I10 Essential (primary) hypertension: Secondary | ICD-10-CM | POA: Diagnosis not present

## 2023-02-15 DIAGNOSIS — I739 Peripheral vascular disease, unspecified: Secondary | ICD-10-CM

## 2023-02-15 DIAGNOSIS — I2510 Atherosclerotic heart disease of native coronary artery without angina pectoris: Secondary | ICD-10-CM

## 2023-02-15 DIAGNOSIS — I252 Old myocardial infarction: Secondary | ICD-10-CM

## 2023-02-15 DIAGNOSIS — L97519 Non-pressure chronic ulcer of other part of right foot with unspecified severity: Secondary | ICD-10-CM

## 2023-02-15 DIAGNOSIS — Z794 Long term (current) use of insulin: Secondary | ICD-10-CM

## 2023-02-15 DIAGNOSIS — R531 Weakness: Secondary | ICD-10-CM

## 2023-02-15 DIAGNOSIS — I5042 Chronic combined systolic (congestive) and diastolic (congestive) heart failure: Secondary | ICD-10-CM

## 2023-02-15 DIAGNOSIS — F1010 Alcohol abuse, uncomplicated: Secondary | ICD-10-CM

## 2023-02-15 LAB — CBC
HCT: 38.7 % — ABNORMAL LOW (ref 39.0–52.0)
Hemoglobin: 13 g/dL (ref 13.0–17.0)
MCH: 27 pg (ref 26.0–34.0)
MCHC: 33.6 g/dL (ref 30.0–36.0)
MCV: 80.5 fL (ref 80.0–100.0)
Platelets: 257 10*3/uL (ref 150–400)
RBC: 4.81 MIL/uL (ref 4.22–5.81)
RDW: 14.1 % (ref 11.5–15.5)
WBC: 8.2 10*3/uL (ref 4.0–10.5)
nRBC: 0 % (ref 0.0–0.2)

## 2023-02-15 LAB — BASIC METABOLIC PANEL
Anion gap: 8 (ref 5–15)
BUN: 22 mg/dL (ref 8–23)
CO2: 26 mmol/L (ref 22–32)
Calcium: 9 mg/dL (ref 8.9–10.3)
Chloride: 104 mmol/L (ref 98–111)
Creatinine, Ser: 0.85 mg/dL (ref 0.61–1.24)
GFR, Estimated: >60 mL/min (ref >60)
Glucose, Bld: 206 mg/dL — ABNORMAL HIGH (ref 70–99)
Potassium: 3.5 mmol/L (ref 3.5–5.1)
Sodium: 138 mmol/L (ref 135–145)

## 2023-02-15 LAB — GLUCOSE, CAPILLARY
Glucose-Capillary: 177 mg/dL — ABNORMAL HIGH (ref 70–99)
Glucose-Capillary: 208 mg/dL — ABNORMAL HIGH (ref 70–99)
Glucose-Capillary: 201 mg/dL — ABNORMAL HIGH (ref 70–99)
Glucose-Capillary: 168 mg/dL — ABNORMAL HIGH (ref 70–99)

## 2023-02-15 LAB — MAGNESIUM: Magnesium: 2.1 mg/dL (ref 1.7–2.4)

## 2023-02-15 LAB — VITAMIN B12: Vitamin B-12: 552 pg/mL (ref 180–914)

## 2023-02-15 LAB — TSH: TSH: 2.542 u[IU]/mL (ref 0.350–4.500)

## 2023-02-15 LAB — PHOSPHORUS: Phosphorus: 3.6 mg/dL (ref 2.5–4.6)

## 2023-02-15 LAB — FOLATE: Folate: 30 ng/mL (ref >5.9)

## 2023-02-15 MED ORDER — VITAMIN C 500 MG PO TABS
250.0000 mg | ORAL_TABLET | Freq: Two times a day (BID) | ORAL | Status: DC
  Administered 2023-02-15 – 2023-02-19 (×8): 250 mg via ORAL
  Filled 2023-02-15 (×8): qty 1

## 2023-02-15 MED ORDER — ENSURE MAX PROTEIN PO LIQD
11.0000 [oz_av] | Freq: Two times a day (BID) | ORAL | Status: DC
  Administered 2023-02-15 – 2023-02-19 (×7): 11 [oz_av] via ORAL
  Filled 2023-02-15: qty 330

## 2023-02-15 NOTE — Consult Note (Signed)
[facility] Vascular Consult Note  MRN : [id number]  Manuel [name] is a 77 y.o. ([birth year]) male who presents with chief complaint of  Chief Complaint  Patient presents with   Weakness  .  History of Present Illness: Patient well known to service. Significant cardiopulmonary history. History of extensive bilateral lower extremity interventions for revascularization in the last 6 months. Right groin wound infection. Prior toe amputations and neuropathy. Presents with chronic worsening right diabetic foot ulcer. Now concerning for progressive osteomyelitis requiring possible TMA. Patient states he has been feeling run down and significantly fatigued. Evaluated in our office yesterday for follow up; VAC dressing discontinued.  Current Facility-Administered Medications  Medication Dose Route Frequency Provider Last Rate Last Admin   acetaminophen (TYLENOL) tablet 650 mg  650 mg Oral Q6H PRN Verdene Lennert, MD   650 mg at 02/15/23 0105   Or   acetaminophen (TYLENOL) suppository 650 mg  650 mg Rectal Q6H PRN Verdene Lennert, MD       ascorbic acid (VITAMIN C) tablet 500 mg  500 mg Oral Daily Verdene Lennert, MD   500 mg at 02/15/23 0865   aspirin EC tablet 81 mg  81 mg Oral Daily Verdene Lennert, MD   81 mg at 02/15/23 0907   atorvastatin (LIPITOR) tablet 80 mg  80 mg Oral QHS Verdene Lennert, MD   80 mg at 02/14/23 2310   carvedilol (COREG) tablet 25 mg  25 mg Oral BID WC Verdene Lennert, MD   25 mg at 02/15/23 0908   ceFEPIme (MAXIPIME) 2 g in sodium chloride 0.9 % 100 mL IVPB  2 g Intravenous Q8H Rockwell [place], RPH 200 mL/hr at 02/15/23 0247 2 g at 02/15/23 0247   clopidogrel (PLAVIX) tablet 75 mg  75 mg Oral Daily Verdene Lennert, MD   75 mg at 02/15/23 7846   folic acid (FOLVITE) tablet 1 mg  1 mg Oral Daily Verdene Lennert, MD   1 mg at 02/15/23 9629   gabapentin (NEURONTIN) capsule 800 mg  800 mg Oral BID Verdene Lennert, MD   800 mg at 02/15/23 0908    hydrALAZINE (APRESOLINE) injection 5 mg  5 mg Intravenous Q8H PRN Verdene Lennert, MD       HYDROmorphone (DILAUDID) injection 0.5-1 mg  0.5-1 mg Intravenous Q3H PRN Verdene Lennert, MD   1 mg at 02/15/23 0240   insulin aspart (novoLOG) injection 0-15 Units  0-15 Units Subcutaneous TID WC Verdene Lennert, MD   3 Units at 02/15/23 0907   insulin glargine-yfgn (SEMGLEE) injection 5 Units  5 Units Subcutaneous QHS Verdene Lennert, MD       iron polysaccharides (NIFEREX) capsule 150 mg  150 mg Oral Daily Verdene Lennert, MD       metroNIDAZOLE (FLAGYL) IVPB 500 mg  500 mg Intravenous Q12H Verdene Lennert, MD 100 mL/hr at 02/15/23 0910 500 mg at 02/15/23 0910   mirtazapine (REMERON) tablet 15 mg  15 mg Oral QHS Verdene Lennert, MD   15 mg at 02/14/23 2314   multivitamin with minerals tablet 1 tablet  1 tablet Oral Daily Verdene Lennert, MD   1 tablet at 02/15/23 0908   ondansetron (ZOFRAN) tablet 4 mg  4 mg Oral Q6H PRN Verdene Lennert, MD       Or   ondansetron (ZOFRAN) injection 4 mg  4 mg Intravenous Q6H PRN Verdene Lennert, MD       oxyCODONE (Oxy IR/ROXICODONE) immediate release tablet 5 mg  5 mg Oral Q4H  PRN Verdene Lennert, MD   5 mg at 02/15/23 0105   polyethylene glycol (MIRALAX / GLYCOLAX) packet 17 g  17 g Oral Daily PRN Verdene Lennert, MD       protein supplement (ENSURE MAX) liquid  11 oz Oral Daily Verdene Lennert, MD   11 oz at 02/15/23 0911   sacubitril-valsartan (ENTRESTO) 24-26 mg per tablet  1 tablet Oral Q12H Verdene Lennert, MD   1 tablet at 02/15/23 7829   senna-docusate (Senokot-S) tablet 1 tablet  1 tablet Oral QHS Verdene Lennert, MD   1 tablet at 02/14/23 2312   sodium chloride flush (NS) 0.9 % injection 3 mL  3 mL Intravenous Q12H Verdene Lennert, MD   3 mL at 02/15/23 5621   spironolactone (ALDACTONE) tablet 50 mg  50 mg Oral Daily Verdene Lennert, MD   50 mg at 02/15/23 0908   tamsulosin (FLOMAX) capsule 0.4 mg  0.4 mg Oral QPC supper Verdene Lennert, MD       thiamine  (VITAMIN B1) tablet 100 mg  100 mg Oral Daily Verdene Lennert, MD   100 mg at 02/15/23 3086   Or   thiamine (VITAMIN B1) injection 100 mg  100 mg Intravenous Daily Verdene Lennert, MD       torsemide (DEMADEX) tablet 20 mg  20 mg Oral Daily Verdene Lennert, MD   20 mg at 02/15/23 5784   vancomycin (VANCOREADY) IVPB 750 mg/150 mL  750 mg Intravenous Q12H Rockwell [place], [facility]        Past Medical History:  Diagnosis Date   Acute ST elevation myocardial infarction (STEMI) of inferior wall (HCC) 04/19/2010   a.) transfered from [facility] to [facility] --> LHC/PCI (very difficult procedure) --> 3.0 x 23 mm and 3.0 x 12 mm Xience stents to RCA   Allergies    Arthritis    Benign essential hypertension    Bilateral carotid artery disease (HCC) 05/08/2021   a.) carotid doppler 05/08/2021: 1-39% BICA   CAD (coronary artery disease) 04/19/2010   a.) inferior STEMI 04/19/2010 --> LHC/PCI: 50-70% pD1, 80% pRI, 90/90/90% RCA (overlapping 3.0 x 23 and 3.0 x 12 mm Xience DES); b.) MV 11/10/2018: fixed minimally reversible inferior perfusion defect   Cellulitis of foot    DDD (degenerative disc disease), cervical    Diabetes mellitus type 2, insulin dependent (HCC)    Diverticulosis    Full dentures    Gout    Hard of hearing    History of bilateral cataract extraction 2022   History of ETOH abuse    Hyperlipidemia    Ischemic cardiomyopathy 04/19/2010   a.) TTE 04/19/2010: 40%; b.) TTE 04/20/2014: EF >55%, mild RVE, triv PR, mild MR/TR, G1DD; c.) TTE 11/10/2018: EF 45%, inf HK, mild RVE, triv TR/PR, mild MR, G1DD; d.) TTE 11/14/2022: EF 25-30%, basal anteroseptal, apical lateral, apical septal, and apex AK, mid anterolateral HK, mild-mod MR/TR   Long term current use of aspirin    Long term current use of clopidogrel    Lumbar degenerative disc disease    Lumbar radiculopathy    Lumbar vertebral fracture (chronic superior endplate of L1)    OSA (obstructive sleep apnea)    a.) unable to tolerate nocturnal  PAP therapy   Peripheral artery disease (HCC)    a.) stenting 05/14/21: 12 mm x 12 cm LifeStent RIGHT dis SFA/prox pop; b.) s/p cath directed thrombolysis RIGHT SFA/pop 06/14/21; c.) s/p mech thrombectomy + stenting 06/15/21: 8 mm x 25cm & 8 mm x 7.5cm  Viabahn; d.) s/p BILAT CFA, profunda femoris, SFA endarterectomies + fogarty embolectomy + stenting 11/15/22: 12mm x 58mm Lifestream BILAT CIAs, 14 mm x 6 cm Lifestream & 13 mm x 5 cm Viabahn LEFT EIA   Peripheral neuropathy    Umbilical hernia     Past Surgical History:  Procedure Laterality Date   AMPUTATION Right 11/18/2022   Procedure: AMPUTATION 4TH AND 5TH RAY;  Surgeon: Linus Galas, [name];  Location: [facility] OR[facility];  Service: Orthopedics/Podiatry;  Laterality: Right;  4th and 5th toe   APPLICATION OF WOUND VAC Right 01/09/2023   Procedure: APPLICATION OF WOUND VAC;  Surgeon: Annice Needy, MD;  Location: [facility] OR[facility];  Service: Vascular;  Laterality: Right;   CATARACT EXTRACTION W/PHACO Right 03/14/2021   Procedure: CATARACT EXTRACTION PHACO AND INTRAOCULAR LENS PLACEMENT (IOC) RIGHT DIABETIC;  Surgeon: Lockie Mola, MD;  Location: [facility];  Service: Ophthalmology;  Laterality: Right;  Diabetic 16.78 01:39.9   CATARACT EXTRACTION W/PHACO Left 03/28/2021   Procedure: CATARACT EXTRACTION PHACO AND INTRAOCULAR LENS PLACEMENT (IOC) LEFT DIABETIC 6.93 01:22.0;  Surgeon: Lockie Mola, MD;  Location: [facility];  Service: Ophthalmology;  Laterality: Left;  Diabetic   COLONOSCOPY     CORONARY ANGIOPLASTY WITH STENT PLACEMENT  03/2010   Procedure: CORONARY ANGIOPLASTY WITH STENT PLACEMENT; Location: [name]   ENDARTERECTOMY FEMORAL Bilateral 11/15/2022   Procedure: BILATERAL COMMON FEMORAL PROFUNDA FEMORIS AND SUPERFICIAL FEMORAL ARTERY ENDARTECTOMIES, RIGHT FOGARTY EMBOLECTOMY OF THE RIGHT SFA  AND  POPLITEAL ARTERIES. AORTAGRAM AND RIGHT LOWER EXTREMITY ANGIOGRAM.;  Surgeon: Annice Needy, MD;  Location: [facility];  Service:  Vascular;  Laterality: Bilateral;   INSERTION OF ILIAC STENT Bilateral 11/15/2022   Procedure: BILATERAL STENT INSERTION IN BILATERAL  COMMON ILIAC ARTERY, STENT INSERTION OF LEFT EXTERNAL ILIAC ARTERY. ANGIOPLASTY RIGHT TIBIAL  AND POPLITEAL ARTERY.;  Surgeon: Annice Needy, MD;  Location: [facility];  Service: Vascular;  Laterality: Bilateral;   LOWER EXTREMITY ANGIOGRAPHY Right 05/14/2021   Procedure: LOWER EXTREMITY ANGIOGRAPHY;  Surgeon: Annice Needy, MD;  Location: [facility];  Service: Cardiovascular;  Laterality: Right;   LOWER EXTREMITY ANGIOGRAPHY Right 06/14/2021   Procedure: Lower Extremity Angiography;  Surgeon: Annice Needy, MD;  Location: [facility];  Service: Cardiovascular;  Laterality: Right;   LOWER EXTREMITY ANGIOGRAPHY Right 11/11/2022   Procedure: Lower Extremity Angiography;  Surgeon: Annice Needy, MD;  Location: [facility];  Service: Cardiovascular;  Laterality: Right;   LOWER EXTREMITY INTERVENTION Right 06/15/2021   Procedure: LOWER EXTREMITY INTERVENTION;  Surgeon: Annice Needy, MD;  Location: [facility];  Service: Cardiovascular;  Laterality: Right;   TONSILLECTOMY     WOUND DEBRIDEMENT Right 01/09/2023   Procedure: DEBRIDEMENT WOUND;  Surgeon: Annice Needy, MD;  Location: [facility];  Service: Vascular;  Laterality: Right;    Social History Social History   Tobacco Use   Smoking status: Never   Smokeless tobacco: Never  Vaping Use   Vaping status: Never Used  Substance Use Topics   Alcohol use: Yes    Alcohol/week: 21.0 standard drinks of alcohol    Types: 7 Glasses of wine, 14 Cans of beer per week    Comment: occassional   Drug use: No    Family History Family History  Problem Relation Age of Onset   Scoliosis Mother    Heart disease Father     No Known Allergies   REVIEW OF SYSTEMS (Negative unless checked)  Constitutional: [] Weight loss  [] Fever  []   Chills Cardiac: [] Chest pain   [] Chest pressure    [] Palpitations   [] Shortness of breath when laying flat   [] Shortness of breath at rest   [] Shortness of breath with exertion. Vascular:  [] Pain in legs with walking   [x] Pain in legs at rest   [] Pain in legs when laying flat   [] Claudication   [] Pain in feet when walking  [] Pain in feet at rest  [] Pain in feet when laying flat   [] History of DVT   [] Phlebitis   [] Swelling in legs   [] Varicose veins   [] Non-healing ulcers Pulmonary:   [] Uses home oxygen   [] Productive cough   [] Hemoptysis   [] Wheeze  [] COPD   [] Asthma Neurologic:  [] Dizziness  [] Blackouts   [] Seizures   [] History of stroke   [] History of TIA  [] Aphasia   [] Temporary blindness   [] Dysphagia   [] Weakness or numbness in arms   [] Weakness or numbness in legs Musculoskeletal:  [] Arthritis   [] Joint swelling   [] Joint pain   [] Low back pain Hematologic:  [] Easy bruising  [] Easy bleeding   [] Hypercoagulable state   [] Anemic  [] Hepatitis Gastrointestinal:  [] Blood in stool   [] Vomiting blood  [] Gastroesophageal reflux/heartburn   [] Difficulty swallowing. Genitourinary:  [] Chronic kidney disease   [] Difficult urination  [] Frequent urination  [] Burning with urination   [] Blood in urine Skin:  [] Rashes   [x] Ulcers   [x] Wounds Psychological:  [] History of anxiety   [x]  History of major depression.  Physical Examination  Vitals:   02/14/23 2000 02/14/23 2201 02/15/23 0444 02/15/23 0751  BP: (!) 187/89 (!) 148/70 136/74 (!) 107/92  Pulse: 78 82 76 74  Resp: 18 20 16    Temp:  97.8 F (36.6 C) (!) 97.5 F (36.4 C)   TempSrc:  Oral Oral   SpO2: 100% 100% 98% 99%   There is no height or weight on file to calculate BMI. Gen:  WD/WN, NAD Pulmonary:  Good air movement, respirations not labored, equal bilaterally.  Cardiac: Irregular Vascular:  Vessel Right Left  Radial Palpable Palpable  Ulnar    Brachial    Carotid    Aorta    Femoral    Popliteal    PT +multiphasic Palpable  DP +multiphasic Palpable   Gastrointestinal: soft,  non-tender/non-distended. No guarding/reflex.  Musculoskeletal: M/S 5/5 throughout.  Extremities without ischemic changes.  RIGHT foot- wrapped. Leg warm.RIGHT Groin- seropurulent drainage and erythema of small open wound. Neurologic: Sensation grossly intact in extremities.  Symmetrical.  Speech is fluent. Motor exam as listed above. Psychiatric: Judgment intact, Mood & affect appropriate for pt's clinical situation.       CBC Lab Results  Component Value Date   WBC 8.2 02/15/2023   HGB 13.0 02/15/2023   HCT 38.7 (L) 02/15/2023   MCV 80.5 02/15/2023   PLT 257 02/15/2023    BMET    Component Value Date/Time   NA 138 02/15/2023 0513   NA 137 02/16/2012 2013   K 3.5 02/15/2023 0513   K 3.8 02/16/2012 2013   CL 104 02/15/2023 0513   CL 102 02/16/2012 2013   CO2 26 02/15/2023 0513   CO2 25 02/16/2012 2013   GLUCOSE 206 (H) 02/15/2023 0513   GLUCOSE 154 (H) 02/16/2012 2013   BUN 22 02/15/2023 0513   BUN 10 02/16/2012 2013   CREATININE 0.85 02/15/2023 0513   CREATININE 0.72 02/16/2012 2013   CALCIUM 9.0 02/15/2023 0513   CALCIUM 9.6 02/16/2012 2013   GFRNONAA >60 02/15/2023 1610  GFRNONAA >60 02/16/2012 2013   GFRAA >60 12/09/2018 0929   GFRAA >60 02/16/2012 2013   Estimated Creatinine Clearance: 68.2 mL/min (by C-G formula based on SCr of 0.85 mg/dL).  COAG Lab Results  Component Value Date   INR 1.3 (H) 01/15/2023   INR 1.1 11/10/2022   INR 1.0 06/12/2021    Radiology MR FOOT RIGHT W WO CONTRAST  Result Date: 02/15/2023 CLINICAL DATA:  Soft tissue infection, ulcer along the plantar foot. EXAM: MRI OF THE RIGHT FOREFOOT WITHOUT AND WITH CONTRAST TECHNIQUE: Multiplanar, multisequence MR imaging of the right forefoot was performed before and after the administration of intravenous contrast. CONTRAST:  8mL GADAVIST GADOBUTROL 1 MMOL/ML IV SOLN COMPARISON:  02/14/2023 radiographs FINDINGS: Despite efforts by the technologist and patient, motion artifact is present on  today's exam and could not be eliminated. This reduces exam sensitivity and specificity. Bones/Joint/Cartilage Amputation of the fourth and fifth digits at the distal metatarsal level. Low-level marrow edema in the base of the proximal phalanx of the third toe and also in the plantar portion of the head of the third metatarsal. Mild associated marrow enhancement for example on images 11-12 of series 12. Early osteomyelitis is a distinct possibility particularly in the proximal phalanx. The remaining toes are unremarkable. No other findings of potential osteomyelitis. Ligaments This frank ligament appears grossly intact. Muscles and Tendons Diffuse trace muscular edema, likely neurogenic. Flexor hallucis longus tendinopathy with mild expansion and accentuated signal in the tendon. Soft tissues Mild dorsal and plantar subcutaneous edema in the foot. No drainable abscess. Plantar to the third MTP joint, a small focus of artifact is present in the subcutaneous tissues just deep to the cutaneous surface for example on image 27 series 4 and image 19 series 3, likely correlating with the radiographic density, this could be a small focus of glass or other foreign body. Possible local ulceration. IMPRESSION: 1. Low-level marrow edema in the base of the proximal phalanx of the third toe and also in the plantar portion of the head of the third metatarsal. Early osteomyelitis is a distinct possibility particularly in the proximal phalanx. 2. Small focus of artifact in the subcutaneous tissues just deep to the cutaneous surface plantar to the third MTP joint, likely correlating with the radiographic density, this could be a small focus of glass or other foreign body. Possible local ulceration. 3. Mild dorsal and plantar subcutaneous edema in the foot. 4. Diffuse trace muscular edema, likely neurogenic. 5. Flexor hallucis longus tendinopathy. 6. Amputation of the fourth and fifth digits at the distal metatarsal level.  Electronically Signed   By: Gaylyn Rong M.D.   On: 02/15/2023 09:49   DG Foot Complete Right  Result Date: 02/14/2023 CLINICAL DATA:  Unhealing wound to second metatarsal area. History of diabetic neuropathy. EXAM: RIGHT FOOT COMPLETE - 3+ VIEW COMPARISON:  Foot radiographs 11/10/2022. FINDINGS: There has been interval amputation of fourth and fifth rays at the distal metatarsal metaphyses. The amputation margins are smooth, without definite osseous irregularity or erosion. There is soft tissue lucency along the undersurface of the third metatarsophalangeal joint likely corresponding to the reported ulceration. A small focus of hyperdensity within the lucency may reflect nonspecific calcification or less likely a bone fragment as no donor site is identified. There is no definite osseous erosion or destruction of the underlying bone. Otherwise, there is no acute osseous finding. Mild Achilles enthesopathy is again noted. There is diffuse soft tissue swelling over the dorsum of the foot. IMPRESSION: 1. Interval  amputation of fourth and fifth rays at the distal metatarsal metaphysis without definite osseous erosion or destruction along the amputation margins. 2. Soft tissue ulceration along the undersurface of the third metatarsophalangeal joint with nonspecific calcification or less likely a bony fragment within the ulceration. No definite osseous erosion or destruction of the underlying bone. Consider MRI for further evaluation if indicated. Electronically Signed   By: Lesia Hausen M.D.   On: 02/14/2023 18:19      Assessment/Plan 1. Adequate perfusion for planned TMA per Podiatry 2. Concern of persistent RIGHT groin infection- will obtain culture and groin [place] 3. Continue ABX   Bertram Denver, MD  02/15/2023 10:24 AM    This note was created with Dragon medical transcription system.  Any error is purely unintentional

## 2023-02-15 NOTE — Plan of Care (Signed)
  Problem: Fluid Volume: Goal: Ability to maintain a balanced intake and output will improve Outcome: Progressing   Problem: Metabolic: Goal: Ability to maintain appropriate glucose levels will improve Outcome: Progressing   Problem: Skin Integrity: Goal: Risk for impaired skin integrity will decrease Outcome: Progressing   Problem: Clinical Measurements: Goal: Ability to maintain clinical measurements within normal limits will improve Outcome: Progressing   Problem: Health Behavior/Discharge Planning: Goal: Ability to manage health-related needs will improve Outcome: Not Progressing

## 2023-02-15 NOTE — Discharge Instructions (Signed)

## 2023-02-15 NOTE — Progress Notes (Signed)
The patient refused bed alarm and has been educated on the importance of having a bed alarm.

## 2023-02-15 NOTE — TOC CM/SW Note (Addendum)
Transition of Care [facility]) - Inpatient Brief Assessment   Patient Details  Name: Manuel [name] MRN: [id number] Date of Birth: 07-18-1945  [facility] of [facility]) CM/SW Contact:    Kemper Durie, RN Phone Number: 02/15/2023, 4:12 PM   Clinical Narrative:  Patient admitted from home, brief assessment done.  Per chart, transmetatarsal amputation planned for Monday.  Substance abuse resources added to AVS.  Transition of Care Asessment:

## 2023-02-15 NOTE — Progress Notes (Signed)
Patient refused bed alarm. Non-slip socks provided. Educated and asked to call before ambulation

## 2023-02-15 NOTE — Consult Note (Signed)
PODIATRY / FOOT AND ANKLE SURGERY CONSULTATION NOTE  Requesting Physician: Dr. Nelson Chimes  Reason for consult: Right foot ulceration, infection  Chief Complaint: Right foot sore   HPI: Manuel [name] is a 77 y.o. male who presents with a chronic wound present to the right subthird metatarsal phalangeal joint area.  He has previously underwent a partial third and fourth ray amputation with Dr. Alberteen Spindle due to gangrene of the tissues.  He has had numerous vascular procedures to improve circulation to the right lower extremity.  He was noted to have worsening of the wound so he went to the emergency room yesterday.  He had an x-ray image taken which showed a concerning change for possible osteomyelitis to the third metatarsal head.  He subsequently then had an MRI performed, results pending.  He notes that he seen some odor as well as drainage from the area but has not seen any redness or swelling.  He states that he has had increased pain to the right foot over the past couple of days more than usual.  He has severe peripheral neuropathy and was switched to usage of Lyrica by vascular surgery.  Patient currently denies nausea, vomiting, fevers, chills.  PMHx:  Past Medical History:  Diagnosis Date   Acute ST elevation myocardial infarction (STEMI) of inferior wall (HCC) 04/19/2010   a.) transfered from [facility] to [facility] --> LHC/PCI (very difficult procedure) --> 3.0 x 23 mm and 3.0 x 12 mm Xience stents to RCA   Allergies    Arthritis    Benign essential hypertension    Bilateral carotid artery disease (HCC) 05/08/2021   a.) carotid doppler 05/08/2021: 1-39% BICA   CAD (coronary artery disease) 04/19/2010   a.) inferior STEMI 04/19/2010 --> LHC/PCI: 50-70% pD1, 80% pRI, 90/90/90% RCA (overlapping 3.0 x 23 and 3.0 x 12 mm Xience DES); b.) MV 11/10/2018: fixed minimally reversible inferior perfusion defect   Cellulitis of foot    DDD (degenerative disc disease), cervical    Diabetes mellitus type 2, insulin  dependent (HCC)    Diverticulosis    Full dentures    Gout    Hard of hearing    History of bilateral cataract extraction 2022   History of ETOH abuse    Hyperlipidemia    Ischemic cardiomyopathy 04/19/2010   a.) TTE 04/19/2010: 40%; b.) TTE 04/20/2014: EF >55%, mild RVE, triv PR, mild MR/TR, G1DD; c.) TTE 11/10/2018: EF 45%, inf HK, mild RVE, triv TR/PR, mild MR, G1DD; d.) TTE 11/14/2022: EF 25-30%, basal anteroseptal, apical lateral, apical septal, and apex AK, mid anterolateral HK, mild-mod MR/TR   Long term current use of aspirin    Long term current use of clopidogrel    Lumbar degenerative disc disease    Lumbar radiculopathy    Lumbar vertebral fracture (chronic superior endplate of L1)    OSA (obstructive sleep apnea)    a.) unable to tolerate nocturnal PAP therapy   Peripheral artery disease (HCC)    a.) stenting 05/14/21: 12 mm x 12 cm LifeStent RIGHT dis SFA/prox pop; b.) s/p cath directed thrombolysis RIGHT SFA/pop 06/14/21; c.) s/p mech thrombectomy + stenting 06/15/21: 8 mm x 25cm & 8 mm x 7.5cm Viabahn; d.) s/p BILAT CFA, profunda femoris, SFA endarterectomies + fogarty embolectomy + stenting 11/15/22: 12mm x 58mm Lifestream BILAT CIAs, 14 mm x 6 cm Lifestream & 13 mm x 5 cm Viabahn LEFT EIA   Peripheral neuropathy    Umbilical hernia     Surgical Hx:  Past Surgical History:  Procedure Laterality Date   AMPUTATION Right 11/18/2022   Procedure: AMPUTATION 4TH AND 5TH RAY;  Surgeon: Linus Galas, [name];  Location: [facility];  Service: Orthopedics/Podiatry;  Laterality: Right;  4th and 5th toe   APPLICATION OF WOUND VAC Right 01/09/2023   Procedure: APPLICATION OF WOUND VAC;  Surgeon: Annice Needy, MD;  Location: [facility];  Service: Vascular;  Laterality: Right;   CATARACT EXTRACTION W/PHACO Right 03/14/2021   Procedure: CATARACT EXTRACTION PHACO AND INTRAOCULAR LENS PLACEMENT (IOC) RIGHT DIABETIC;  Surgeon: Lockie Mola, MD;  Location: [facility];  Service:  Ophthalmology;  Laterality: Right;  Diabetic 16.78 01:39.9   CATARACT EXTRACTION W/PHACO Left 03/28/2021   Procedure: CATARACT EXTRACTION PHACO AND INTRAOCULAR LENS PLACEMENT (IOC) LEFT DIABETIC 6.93 01:22.0;  Surgeon: Lockie Mola, MD;  Location: [facility];  Service: Ophthalmology;  Laterality: Left;  Diabetic   COLONOSCOPY     CORONARY ANGIOPLASTY WITH STENT PLACEMENT  03/2010   Procedure: CORONARY ANGIOPLASTY WITH STENT PLACEMENT; Location: [name]   ENDARTERECTOMY FEMORAL Bilateral 11/15/2022   Procedure: BILATERAL COMMON FEMORAL PROFUNDA FEMORIS AND SUPERFICIAL FEMORAL ARTERY ENDARTECTOMIES, RIGHT FOGARTY EMBOLECTOMY OF THE RIGHT SFA  AND  POPLITEAL ARTERIES. AORTAGRAM AND RIGHT LOWER EXTREMITY ANGIOGRAM.;  Surgeon: Annice Needy, MD;  Location: [facility];  Service: Vascular;  Laterality: Bilateral;   INSERTION OF ILIAC STENT Bilateral 11/15/2022   Procedure: BILATERAL STENT INSERTION IN BILATERAL  COMMON ILIAC ARTERY, STENT INSERTION OF LEFT EXTERNAL ILIAC ARTERY. ANGIOPLASTY RIGHT TIBIAL  AND POPLITEAL ARTERY.;  Surgeon: Annice Needy, MD;  Location: [facility];  Service: Vascular;  Laterality: Bilateral;   LOWER EXTREMITY ANGIOGRAPHY Right 05/14/2021   Procedure: LOWER EXTREMITY ANGIOGRAPHY;  Surgeon: Annice Needy, MD;  Location: [facility];  Service: Cardiovascular;  Laterality: Right;   LOWER EXTREMITY ANGIOGRAPHY Right 06/14/2021   Procedure: Lower Extremity Angiography;  Surgeon: Annice Needy, MD;  Location: [facility];  Service: Cardiovascular;  Laterality: Right;   LOWER EXTREMITY ANGIOGRAPHY Right 11/11/2022   Procedure: Lower Extremity Angiography;  Surgeon: Annice Needy, MD;  Location: [facility];  Service: Cardiovascular;  Laterality: Right;   LOWER EXTREMITY INTERVENTION Right 06/15/2021   Procedure: LOWER EXTREMITY INTERVENTION;  Surgeon: Annice Needy, MD;  Location: [facility];  Service: Cardiovascular;  Laterality: Right;    TONSILLECTOMY     WOUND DEBRIDEMENT Right 01/09/2023   Procedure: DEBRIDEMENT WOUND;  Surgeon: Annice Needy, MD;  Location: [facility];  Service: Vascular;  Laterality: Right;    FHx:  Family History  Problem Relation Age of Onset   Scoliosis Mother    Heart disease Father     Social History:  reports that he has never smoked. He has never used smokeless tobacco. He reports current alcohol use of about 21.0 standard drinks of alcohol per week. He reports that he does not use drugs.  Allergies: No Known Allergies  Medications Prior to Admission  Medication Sig Dispense Refill   amitriptyline (ELAVIL) 25 MG tablet Take 1 tablet (25 mg total) by mouth at bedtime. 30 tablet 2   aspirin EC 81 MG tablet Take 1 tablet (81 mg total) by mouth daily. 150 tablet 2   atorvastatin (LIPITOR) 80 MG tablet Take 1 tablet (80 mg total) by mouth daily. (Patient taking differently: Take 80 mg by mouth at bedtime.) 90 tablet 1   carvedilol (COREG) 25 MG tablet Take 25 mg by mouth 2 (two) times daily with  a meal.     clopidogrel (PLAVIX) 75 MG tablet Take 1 tablet (75 mg total) by mouth daily. 90 tablet 1   dapagliflozin propanediol (FARXIGA) 10 MG TABS tablet Take 1 tablet (10 mg total) by mouth daily. 30 tablet 2   fluticasone (FLONASE) 50 MCG/ACT nasal spray Place 1 spray into both nostrils daily as needed for allergies or rhinitis.     gabapentin (NEURONTIN) 400 MG capsule Take 800 mg by mouth 2 (two) times daily.     insulin aspart protamine- aspart (NOVOLOG MIX 70/30) (70-30) 100 UNIT/ML injection Inject 30 Units into the skin 2 (two) times daily with a meal.     losartan (COZAAR) 25 MG tablet Take 1 tablet (25 mg total) by mouth daily. 30 tablet 2   oxyCODONE (OXY IR/ROXICODONE) 5 MG immediate release tablet Take 1-2 tablets (5-10 mg total) by mouth every 6 (six) hours as needed for severe pain. 25 tablet 0   sacubitril-valsartan (ENTRESTO) 24-26 MG Take 1 tablet by mouth every 12 (twelve) hours.      spironolactone (ALDACTONE) 50 MG tablet Take 1 tablet (50 mg total) by mouth daily. 30 tablet 0   tamsulosin (FLOMAX) 0.4 MG CAPS capsule Take 1 capsule (0.4 mg total) by mouth daily after supper. 30 capsule 2   torsemide (DEMADEX) 20 MG tablet Take 1 tablet (20 mg total) by mouth daily. 30 tablet 2   ascorbic acid (VITAMIN C) 500 MG tablet Take 1 tablet (500 mg total) by mouth daily. 30 tablet 0   Ensure Max Protein (ENSURE MAX PROTEIN) LIQD Take 330 mLs (11 oz total) by mouth 2 (two) times daily. (Patient taking differently: Take 11 oz by mouth daily.) 10000 mL 3   iron polysaccharides (NIFEREX) 150 MG capsule Take 1 capsule (150 mg total) by mouth daily. 90 capsule 0   levocetirizine (XYZAL) 5 MG tablet Take 1 tablet (5 mg total) by mouth at bedtime as needed for allergies. (Patient not taking: Reported on 02/14/2023)     pregabalin (LYRICA) 75 MG capsule Take 1 capsule (75 mg total) by mouth 2 (two) times daily. 60 capsule 3   senna-docusate (SENOKOT-S) 8.6-50 MG tablet Take 1 tablet by mouth at bedtime as needed for mild constipation. 30 tablet 0    Physical Exam: General: Alert and oriented.  No apparent distress.  Vascular: DP/PT pulses nonpalpable bilateral, both feet appear to be warm to touch, no hair growth present to bilateral lower extremities.  Capillary fill time appears to be intact to digits remaining bilaterally.  Neuro: Light touch sensation nearly absent to bilateral lower extremities.  Derm: Right subthird metatarsal phalange joint appears to have an ulceration present that probes to bone/capsule at the third metatarsal head area, some seropurulent drainage present to the area as well as a large amount of hyperkeratotic buildup, moderate odor, no surrounding erythema or edema though present to the area.    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  Results for orders placed or performed during the hospital encounter of 02/14/23  (from the past 48 hour(s))  Basic metabolic panel     Status: Abnormal   Collection Time: 02/14/23 11:57 AM  Result Value Ref Range   Sodium 133 (L) 135 - 145 mmol/L   Potassium 3.7 3.5 - 5.1 mmol/L   Chloride 95 (L) 98 - 111 mmol/L   CO2 27 22 - 32 mmol/L   Glucose, Bld 194 (H) 70 - 99 mg/dL  Comment: Glucose reference range applies only to samples taken after fasting for at least 8 hours.   BUN 26 (H) 8 - 23 mg/dL   Creatinine, Ser 2.72 0.61 - 1.24 mg/dL   Calcium 9.1 8.9 - 53.6 mg/dL   GFR, Estimated >64 >40 mL/min    Comment: (NOTE) Calculated using the CKD-EPI Creatinine Equation (2021)    Anion gap 11 5 - 15    Comment: Performed at [facility], [address] [place], [place] [address]  CBC     Status: Abnormal   Collection Time: 02/14/23 11:57 AM  Result Value Ref Range   WBC 10.8 (H) 4.0 - 10.5 K/uL   RBC 5.32 4.22 - 5.81 MIL/uL   Hemoglobin 14.4 13.0 - 17.0 g/dL   HCT 59.5 63.8 - 75.6 %   MCV 81.4 80.0 - 100.0 fL   MCH 27.1 26.0 - 34.0 pg   MCHC 33.3 30.0 - 36.0 g/dL   RDW 43.3 29.5 - 18.8 %   Platelets 303 150 - 400 K/uL   nRBC 0.0 0.0 - 0.2 %    Comment: Performed at [facility], [address] [place], [place] [address]  Urinalysis, Routine w reflex microscopic -Urine, Clean Catch     Status: Abnormal   Collection Time: 02/14/23 11:57 AM  Result Value Ref Range   Color, Urine YELLOW (A) YELLOW   APPearance CLEAR (A) CLEAR   Specific Gravity, Urine 1.017 1.005 - 1.030   pH 5.0 5.0 - 8.0   Glucose, UA >=500 (A) NEGATIVE mg/dL   Hgb urine dipstick NEGATIVE NEGATIVE   Bilirubin Urine NEGATIVE NEGATIVE   Ketones, ur NEGATIVE NEGATIVE mg/dL   Protein, ur 630 (A) NEGATIVE mg/dL   Nitrite NEGATIVE NEGATIVE   Leukocytes,Ua NEGATIVE NEGATIVE   RBC / HPF 0-5 0 - 5 RBC/hpf   WBC, UA 0-5 0 - 5 WBC/hpf   Bacteria, UA NONE SEEN NONE SEEN   Squamous Epithelial / HPF 0-5 0 - 5 /HPF   Hyaline Casts, UA PRESENT     Comment: Performed at [facility], [address], [place], [place] [address]  Resp panel by RT-PCR (RSV, Flu A&B, Covid) Anterior Nasal Swab     Status: None   Collection Time: 02/14/23 11:57 AM   Specimen: Anterior Nasal Swab  Result Value Ref Range   SARS Coronavirus 2 by RT PCR NEGATIVE NEGATIVE    Comment: (NOTE) SARS-CoV-2 target nucleic acids are NOT DETECTED.  The SARS-CoV-2 RNA is generally detectable in upper respiratory specimens during the acute phase of infection. The lowest concentration of SARS-CoV-2 viral copies this assay can detect is 138 copies/mL. A negative result does not preclude SARS-Cov-2 infection and should not be used as the sole basis for treatment or other patient management decisions. A negative result may occur with  improper specimen collection/handling, submission of specimen other than nasopharyngeal swab, presence of viral mutation(s) within the areas targeted by this assay, and inadequate number of viral copies(<138 copies/mL). A negative result must be combined with clinical observations, patient history, and epidemiological information. The expected result is Negative.  Fact Sheet for Patients:  BloggerCourse.com  Fact Sheet for Healthcare Providers:  SeriousBroker.it  This test is no t yet approved or cleared by the [place] FDA and  has been authorized for detection and/or diagnosis of SARS-CoV-2 by FDA under an Emergency Use Authorization (EUA). This EUA will remain  in effect (meaning this test can be used) for the duration of  the COVID-19 declaration under Section 564(b)(1) of the Act, 21 U.S.C.section 360bbb-3(b)(1), unless the authorization is terminated  or revoked sooner.       Influenza A by PCR NEGATIVE NEGATIVE   Influenza B by PCR NEGATIVE NEGATIVE    Comment: (NOTE) The Xpert Xpress SARS-CoV-2/FLU/RSV plus assay is intended as an aid in the diagnosis of influenza from Nasopharyngeal swab  specimens and should not be used as a sole basis for treatment. Nasal washings and aspirates are unacceptable for Xpert Xpress SARS-CoV-2/FLU/RSV testing.  Fact Sheet for Patients: BloggerCourse.com  Fact Sheet for Healthcare Providers: SeriousBroker.it  This test is not yet approved or cleared by the [place] FDA and has been authorized for detection and/or diagnosis of SARS-CoV-2 by FDA under an Emergency Use Authorization (EUA). This EUA will remain in effect (meaning this test can be used) for the duration of the COVID-19 declaration under Section 564(b)(1) of the Act, 21 U.S.C. section 360bbb-3(b)(1), unless the authorization is terminated or revoked.     Resp Syncytial Virus by PCR NEGATIVE NEGATIVE    Comment: (NOTE) Fact Sheet for Patients: BloggerCourse.com  Fact Sheet for Healthcare Providers: SeriousBroker.it  This test is not yet approved or cleared by the [place] FDA and has been authorized for detection and/or diagnosis of SARS-CoV-2 by FDA under an Emergency Use Authorization (EUA). This EUA will remain in effect (meaning this test can be used) for the duration of the COVID-19 declaration under Section 564(b)(1) of the Act, 21 U.S.C. section 360bbb-3(b)(1), unless the authorization is terminated or revoked.  Performed at [facility], [address] [place], [place] [address]   Blood culture (routine x 2)     Status: None (Preliminary result)   Collection Time: 02/14/23  5:20 PM   Specimen: BLOOD  Result Value Ref Range   Specimen Description BLOOD BLOOD RIGHT ARM    Special Requests      BOTTLES DRAWN AEROBIC AND ANAEROBIC Blood Culture adequate volume   Culture      NO GROWTH < 24 HOURS Performed at [facility], [address], [place], [place] [address]    Report Status PENDING   Brain natriuretic peptide     Status:  Abnormal   Collection Time: 02/14/23  5:20 PM  Result Value Ref Range   B Natriuretic Peptide 445.2 (H) 0.0 - 100.0 pg/mL    Comment: Performed at [facility], [address] [place], [place] [address]  Sedimentation rate     Status: None   Collection Time: 02/14/23  5:23 PM  Result Value Ref Range   Sed Rate 8 0 - 20 mm/hr    Comment: Performed at [facility], [address] [place], [place] [address]  Blood culture (routine x 2)     Status: None (Preliminary result)   Collection Time: 02/14/23  5:23 PM   Specimen: BLOOD  Result Value Ref Range   Specimen Description BLOOD BLOOD LEFT ARM    Special Requests      BOTTLES DRAWN AEROBIC AND ANAEROBIC Blood Culture adequate volume   Culture      NO GROWTH < 24 HOURS Performed at [facility], [address], [place], [place] [address]    Report Status PENDING   Folate     Status: None   Collection Time: 02/15/23  5:13 AM  Result Value Ref Range   Folate 30.0 >5.9 ng/mL    Comment: Performed at [facility], [address] [place], [place]  [address]  CBC     Status: Abnormal   Collection Time: 02/15/23  5:13 AM  Result Value Ref Range   WBC 8.2 4.0 - 10.5 K/uL   RBC 4.81 4.22 - 5.81 MIL/uL   Hemoglobin 13.0 13.0 - 17.0 g/dL   HCT 60.4 (L) 54.0 - 98.1 %   MCV 80.5 80.0 - 100.0 fL   MCH 27.0 26.0 - 34.0 pg   MCHC 33.6 30.0 - 36.0 g/dL   RDW 19.1 47.8 - 29.5 %   Platelets 257 150 - 400 K/uL   nRBC 0.0 0.0 - 0.2 %    Comment: Performed at [facility], [address], [place], [place] [address]  Basic metabolic panel     Status: Abnormal   Collection Time: 02/15/23  5:13 AM  Result Value Ref Range   Sodium 138 135 - 145 mmol/L   Potassium 3.5 3.5 - 5.1 mmol/L   Chloride 104 98 - 111 mmol/L   CO2 26 22 - 32 mmol/L   Glucose, Bld 206 (H) 70 - 99 mg/dL    Comment: Glucose reference range applies only to samples taken after fasting for at least 8 hours.   BUN 22 8 - 23 mg/dL    Creatinine, Ser 8.65 0.61 - 1.24 mg/dL   Calcium 9.0 8.9 - 78.4 mg/dL   GFR, Estimated >69 >62 mL/min    Comment: (NOTE) Calculated using the CKD-EPI Creatinine Equation (2021)    Anion gap 8 5 - 15    Comment: Performed at [facility], [address] [place], [place] [address]  Magnesium     Status: None   Collection Time: 02/15/23  5:13 AM  Result Value Ref Range   Magnesium 2.1 1.7 - 2.4 mg/dL    Comment: Performed at [facility], [address] [place], [place] [address]  Phosphorus     Status: None   Collection Time: 02/15/23  5:13 AM  Result Value Ref Range   Phosphorus 3.6 2.5 - 4.6 mg/dL    Comment: Performed at [facility], [address] [place], [place] [address]  TSH     Status: None   Collection Time: 02/15/23  5:13 AM  Result Value Ref Range   TSH 2.542 0.350 - 4.500 uIU/mL    Comment: Performed by a 3rd Generation assay with a functional sensitivity of <=0.01 uIU/mL. Performed at [facility], [address] [place], [place] [address]    DG Foot Complete Right  Result Date: 02/14/2023 CLINICAL DATA:  Unhealing wound to second metatarsal area. History of diabetic neuropathy. EXAM: RIGHT FOOT COMPLETE - 3+ VIEW COMPARISON:  Foot radiographs 11/10/2022. FINDINGS: There has been interval amputation of fourth and fifth rays at the distal metatarsal metaphyses. The amputation margins are smooth, without definite osseous irregularity or erosion. There is soft tissue lucency along the undersurface of the third metatarsophalangeal joint likely corresponding to the reported ulceration. A small focus of hyperdensity within the lucency may reflect nonspecific calcification or less likely a bone fragment as no donor site is identified. There is no definite osseous erosion or destruction of the underlying bone. Otherwise, there is no acute osseous finding. Mild Achilles enthesopathy is again noted. There is diffuse soft tissue swelling  over the dorsum of the foot. IMPRESSION: 1. Interval amputation of fourth and fifth rays at the distal metatarsal metaphysis without definite osseous erosion or destruction along the amputation margins. 2. Soft tissue ulceration along the undersurface of the third metatarsophalangeal  joint with nonspecific calcification or less likely a bony fragment within the ulceration. No definite osseous erosion or destruction of the underlying bone. Consider MRI for further evaluation if indicated. Electronically Signed   By: Lesia Hausen M.D.   On: 02/14/2023 18:19    Blood pressure (!) 107/92, pulse 74, temperature (!) 97.5 F (36.4 C), temperature source Oral, resp. rate 16, SpO2 99%.  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 -X-ray imaging and MRI imaging reviewed and discussed with patient.  Appears to show some acute inflammation to the third metatarsal head area consistent with likely early change osteomyelitis seen on T2/STIR imaging.  No fat replacement is seen on T1 though.  I would recommend clinical correlation with today's examination.  Awaiting formal read from radiology as well. -Clinically patient appears to have seropurulent discharge from the wound today with hyperkeratotic buildup around the periphery.  The wound does probe deep and probes to the capsule and likely to the metatarsal phalangeal joint as there is a hard stop.  Wound culture taken today and sent off. -Appreciate medicine recommendations for antibiotic therapy. -100% excisional wound debridement performed as described below, patient tolerated procedure well. -Applied Betadine wet-to-dry dressing to the area today. -Discussed treatment options with patient.  Discussed conservative care consisting of local wound care and IV antibiotics.  Discussed with patient that  this is not likely to work as he has had this wound chronically and now its likely made worse from the fact that he has had previous amputations to the fourth and fifth rays.  Believe that the pressure is the issue as he puts the most pressure through this part of the foot now and is ultimately led to worsening of the ulceration.  Believe that even if it did heal he is likely to continue to be ulcerated to the area.  Also discussed surgical intervention consisting of right transmetatarsal amputation. -All treatment options were discussed with the patient of both conservative and surgical attempts at correction including potential risks and complications.  Patient has elected for procedure consisting of right transmetatarsal amputation.  No guarantees given.  Consent obtained.  Patient is at high risk for limb loss.  Would like to have vascular surgery assessed patient first to see if anything further is needed for improvement in circulation.  If they believe it is necessary to perform another procedure to improve circulation, will delay case until after.  Tentatively for now we will plan on performing procedure Monday afternoon.  Patient to be n.p.o. at midnight Sunday for procedure on Monday for now.  Debridment of ulcer: Location: Right third metatarsal phalangeal joint Pre-debridement measurement: 0.4 x 0.8 x 1 cm Post-debridement measurement: 0.5 x 1 x 1 cm Tissue removed:   Fibrous tissue, hyperkeratotic tissue, biofilm Ulcer was debrided under percent excisional sharply with combination of tissue nippers and scalpel blade into the subcutaneous tissue/capsule  Rosetta Posner, [name] 02/15/2023, 8:53 AM

## 2023-02-15 NOTE — Plan of Care (Signed)
  Problem: Education: Goal: Ability to describe self-care measures that may prevent or decrease complications (Diabetes Survival Skills Education) will improve Outcome: Progressing Goal: Individualized Educational Video(s) Outcome: Progressing   Problem: Coping: Goal: Ability to adjust to condition or change in health will improve Outcome: Progressing   Problem: Health Behavior/Discharge Planning: Goal: Ability to identify and utilize available resources and services will improve Outcome: Progressing Goal: Ability to manage health-related needs will improve Outcome: Progressing   Problem: Metabolic: Goal: Ability to maintain appropriate glucose levels will improve Outcome: Progressing   Problem: Nutritional: Goal: Maintenance of adequate nutrition will improve Outcome: Progressing Goal: Progress toward achieving an optimal weight will improve Outcome: Progressing   Problem: Skin Integrity: Goal: Risk for impaired skin integrity will decrease Outcome: Progressing   Problem: Tissue Perfusion: Goal: Adequacy of tissue perfusion will improve Outcome: Progressing   Problem: Education: Goal: Knowledge of General Education information will improve Description: Including pain rating scale, medication(s)/side effects and non-pharmacologic comfort measures Outcome: Progressing   Problem: Health Behavior/Discharge Planning: Goal: Ability to manage health-related needs will improve Outcome: Progressing   Problem: Clinical Measurements: Goal: Ability to maintain clinical measurements within normal limits will improve Outcome: Progressing Goal: Will remain free from infection Outcome: Progressing Goal: Diagnostic test results will improve Outcome: Progressing Goal: Respiratory complications will improve Outcome: Progressing Goal: Cardiovascular complication will be avoided Outcome: Progressing   Problem: Activity: Goal: Risk for activity intolerance will decrease Outcome:  Progressing   Problem: Nutrition: Goal: Adequate nutrition will be maintained Outcome: Progressing   Problem: Coping: Goal: Level of anxiety will decrease Outcome: Progressing   Problem: Elimination: Goal: Will not experience complications related to bowel motility Outcome: Progressing Goal: Will not experience complications related to urinary retention Outcome: Progressing

## 2023-02-15 NOTE — Progress Notes (Addendum)
Initial Nutrition Assessment  DOCUMENTATION CODES:   Not applicable  INTERVENTION:   Ensure Max protein supplement po BID, each supplement provides 150kcal and 30g of protein.  MVI po daily   Vitamin C 250mg  po BID  Pt at high refeed risk; recommend monitor potassium, magnesium and phosphorus labs daily until stable  Daily weights   NUTRITION DIAGNOSIS:   Increased nutrient needs related to wound healing as evidenced by estimated needs.  GOAL:   Patient will meet greater than or equal to 90% of their needs  MONITOR:   PO intake, Supplement acceptance, Labs, Weight trends, Skin, I & O's  REASON FOR ASSESSMENT:   Consult Assessment of nutrition requirement/status  ASSESSMENT:   77 y.o. 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  RD working remotely.  RD unable to speak with pt today as pt having imaging at time of RD visit. Pt is known to nutrition department and this RD from previous admissions. Pt generally with good appetite and oral intake at baseline but reports poor oral intake and a 20lb weight loss over the past month. Per chart, pt is down 15lbs(8%) over the past 6 weeks; this is significant weight loss. Recommend continue supplements and vitamins. Pt is documented to be eating 100% of meals in hospital. Pt is at high refeed risk. Plan is for likely TMA per MD note. RD will obtain exam and history at follow up.    Medications reviewed and include: vitamin C, aspirin, plavix, folic acid, insulin, iron, remeron, MVI, senokot, aldactone, thiamine, torsemide, cefepime, metronidazole, vancomycin    Labs reviewed: K 3.5 wnl, P 3.6 wnl, Mg 2.1 wnl BNP- 445.2(H)- 10/25 Cbgs- 208, 177 x 24 hrs  AIC 6.9(H)- 7/21  NUTRITION - FOCUSED PHYSICAL EXAM: Unable to perform at this time   Diet Order:   Diet Order             Diet NPO time specified  Diet effective midnight           Diet heart healthy/carb modified Room service appropriate? Yes; Fluid consistency: Thin  Diet effective now                  EDUCATION NEEDS:   Not appropriate for education at this time  Skin:  Skin Assessment: Reviewed RN Assessment (diabetic foot wound, groin infection)  Last BM:  10/24  Height:   Ht Readings from Last 1 Encounters:  01/16/23 5\' 8"  (1.727 m)    Weight:   Wt Readings from Last 1 Encounters:  02/14/23 81.4 kg   BMI:  There is no height or weight on file to calculate BMI.  Estimated Nutritional Needs:   Kcal:  1900-2200kcal/day  Protein:  95-110g/day  Fluid:  1.8-2.1L/day  Betsey Holiday MS, RD, LDN Please refer to [facility] for RD and/or RD on-call/weekend/after hours pager

## 2023-02-15 NOTE — Progress Notes (Signed)
Progress Note   Patient: Manuel [name] WUJ:[id number] DOB: 26-Feb-1946 DOA: 02/14/2023     1 DOS: the patient was seen and examined on 02/15/2023   Brief hospital course: Taken from H&P.  Manuel [name] is a 77 y.o. male with medical history significant of HFrEF with last EF of 25-30% (July 2024) 2/2 ischemic cardiomyopathy, CAD s/p CABG, peripheral artery disease s/p multiple interventions (most recently July 2024), insulin-dependent type 2 diabetes, hypertension, hyperlipidemia, OSA not on CPAP, who presents to the ED due to worsening generalized weakness over the past 6 weeks.He is also lost approximately 20 pounds during this time with a lack of appetite. He notes that during this time, he is also been unable to sleep.   He also experiencing worsening pain in his right leg and foul-smelling drainage from his ulcer on his right foot. He notes that the ulcer has been there for nearly 2 years now though.   On arrival to ED vital stable, WBC of 10.8, sodium 133, glucose 194. COVID-19, RSV and influenza PCR negative.  UA without evidence of infection. Right foot x-ray was obtained with no evidence of osteomyelitis.  Podiatry was consulted and patient was started on vancomycin/cefepime/Flagyl for concern of diabetic infected foot ulcer.  10/26: Vital stable.  Labs stable, leukocytosis resolved.  Podiatry sent, wound culture which is pending, preliminary blood cultures negative.  BNP elevated at 445 but much improved than a month ago when it was 1760.  MRI of foot Low-level marrow edema in the base of the proximal phalanx of the third toe and also in the plantar portion of the head of the third metatarsal. Early osteomyelitis is possible. Small focus of artifact in the subcutaneous tissues just deep to the cutaneous surface plantar to the third MTP joint, likely correlating with the radiographic density, this could be a small focus of glass or other foreign body. Possible local ulceration.   Continuing broad-spectrum antibiotics.  Transmetatarsal amputation was planned for Monday by podiatry.  Patient also has right groin wound, vascular surgery removed wound VAC recently.  They evaluated her and according to their note he has adequate perfusion but concern of right groin infection so cultures were obtained.  Assessment and Plan: * Diabetic foot ulcer (HCC) Patient is presenting with evidence of progression of chronic ulcer on the plantar aspect of the right foot.  Given location, consistent with a diabetic foot ulcer, although there is likely an ischemic component as well. MRI of foot with concern of osteo - Podiatry consulted; planning transmetatarsal amputation on Monday - Vascular surgery was also consulted as he recent had a vascular procedure and wound VAC was removed.  Per vascular surgery patient has adequate circulation but concern of right groin wound infection so cultures were taken -Continue with broad-spectrum antibiotics  Generalized weakness Patient notes gradually worsening fatigue and generalized weakness on exertion for the last 6 weeks, in addition to insomnia, poor appetite and weight loss.  There may be a component of underlying infection contributing to this, in addition to patient's comorbidities including HFrEF, however on further discussion, I suspect patient is depressed versus adjustment disorder. TSH and B12 normal, B1 pending - PT/OT after surgical evaluation -Started on mirtazapine 15 mg nightly and amitriptyline was discontinued  PAD (peripheral artery disease) (HCC) Complicated history PAD with multiple interventions most recently in July, At which time he underwent bilateral femoral artery endarterectomies, embolectomy of the right SFA, and multiple stent placement.  Complicated by poor wound healing requiring wound VAC,  that was removed recently as outpatient Vascular surgery reevaluated him and there was a concern of right groin infection so  cultures were taken, patient is already on broad-spectrum antibiotics - Continue DAPT and statin  Hypertension Patient was taking both losartan and Entresto, in addition to Coreg, but remains hypertensive in the ED.  - Discontinue home losartan - Restart home Entresto and Coreg - Hydralazine as needed for SBP over 180  Diabetes mellitus type 2, insulin dependent (HCC) - Hold home antiglycemic agents - SSI, moderate - Semglee 5 units at bedtime  Alcohol abuse Patient states that he drinks several beers a day in addition to liquor.  On previous admissions, no evidence of alcohol withdrawal.  He realizes that he needs to discontinue his alcohol use but has been struggling with it.  - CIWA monitoring without Ativan - Daily multi vitamin folic acid and thiamine  Chronic combined systolic and diastolic CHF (congestive heart failure) (HCC) At this time, patient appears euvolemic.  - Continue home GDMT and torsemide - Daily weights  Coronary artery disease with history of myocardial infarction without history of CABG - Continue home regimen      Subjective: Patient continued to have some pain in right groin and right foot.  Physical Exam: Vitals:   02/14/23 2201 02/15/23 0444 02/15/23 0751 02/15/23 1552  BP: (!) 148/70 136/74 (!) 107/92 (!) 140/68  Pulse: 82 76 74 69  Resp: 20 16 19    Temp: 97.8 F (36.6 C) (!) 97.5 F (36.4 C) 97.6 F (36.4 C)   TempSrc: Oral Oral Oral   SpO2: 100% 98% 99% 98%   General.  Frail elderly man, in no acute distress. Pulmonary.  Lungs clear bilaterally, normal respiratory effort. CV.  Regular rate and rhythm, no JVD, rub or murmur. Abdomen.  Soft, nontender, nondistended, BS positive. CNS.  Alert and oriented .  No focal neurologic deficit. Extremities.  No edema,  Right foot with bandage, palpable weak pulses on left, Doppler on right Psychiatry.  Judgment and insight appears normal.   Data Reviewed: Prior data reviewed  Family  Communication: Discussed with son on phone.  Disposition: Status is: Inpatient Remains inpatient appropriate because: Severity of illness  Planned Discharge Destination:  TBD  Time spent: 50 minutes  This record has been created using Conservation officer, historic buildings. Errors have been sought and corrected,but may not always be located. Such creation errors do not reflect on the standard of care.   Author: Arnetha Courser, MD 02/15/2023 4:58 PM  For on call review [email or web address].

## 2023-02-15 NOTE — Hospital Course (Addendum)
Taken from H&P.  Manuel [name] is a 77 y.o. male with medical history significant of HFrEF with last EF of 25-30% (July 2024) 2/2 ischemic cardiomyopathy, CAD s/p CABG, peripheral artery disease s/p multiple interventions (most recently July 2024), insulin-dependent type 2 diabetes, hypertension, hyperlipidemia, OSA not on CPAP, who presents to the ED due to worsening generalized weakness over the past 6 weeks.He is also lost approximately 20 pounds during this time with a lack of appetite. He notes that during this time, he is also been unable to sleep.   He also experiencing worsening pain in his right leg and foul-smelling drainage from his ulcer on his right foot. He notes that the ulcer has been there for nearly 2 years now though.   On arrival to ED vital stable, WBC of 10.8, sodium 133, glucose 194. COVID-19, RSV and influenza PCR negative.  UA without evidence of infection. Right foot x-ray was obtained with no evidence of osteomyelitis.  Podiatry was consulted and patient was started on vancomycin/cefepime/Flagyl for concern of diabetic infected foot ulcer.  10/26: Vital stable.  Labs stable, leukocytosis resolved.  Podiatry sent, wound culture which is pending, preliminary blood cultures negative.  BNP elevated at 445 but much improved than a month ago when it was 1760.  MRI of foot Low-level marrow edema in the base of the proximal phalanx of the third toe and also in the plantar portion of the head of the third metatarsal. Early osteomyelitis is possible. Small focus of artifact in the subcutaneous tissues just deep to the cutaneous surface plantar to the third MTP joint, likely correlating with the radiographic density, this could be a small focus of glass or other foreign body. Possible local ulceration.  Continuing broad-spectrum antibiotics.  Transmetatarsal amputation was planned for Monday by podiatry.  Patient also has right groin wound, vascular surgery removed wound VAC  recently.  They evaluated her and according to their note he has adequate perfusion but concern of right groin infection so cultures were obtained.  10/27: Hemodynamically stable, going for transmetatarsal amputation tomorrow.  Wound culture with gram-positive cocci in pairs and chains, gram-positive and gram-negative rods.  10/28: Hemodynamically stable, groin wound with pansensitive E. coli and rare Staph aureus, foot wound with Morganella Morganii which shows resistance to ampicillin and Augmentin only,  Going for transmetatarsal amputation later today.  ID was also consulted.  10/29: Remained hemodynamically stable.  S/p transmetatarsal amputation yesterday, tolerated the procedure well.  Some leukocytosis today likely reactive to procedure, elevated CBG-patient also received a dose of Decadron during procedure.  Pseudohyponatremia secondary to hyperglycemia.  Increasing the dose of Semglee to 10 units twice daily and mealtime coverage to 6 units.  PT is recommending home health.  10/30: Patient remained hemodynamically stable.  ID is recommending ciprofloxacin and Augmentin for 10 more days which has prescribed.  Patient was also given some new supplements.  His amitriptyline was switched with Remeron.  Patient was recently prescribed Lyrica which he will continue on discharge, he should stop using Neurontin if using Lyrica.  Home health services were ordered. Patient will continue on current medications and need to have a close follow-up with his providers for further recommendations.

## 2023-02-16 DIAGNOSIS — E11621 Type 2 diabetes mellitus with foot ulcer: Secondary | ICD-10-CM | POA: Diagnosis not present

## 2023-02-16 DIAGNOSIS — R531 Weakness: Secondary | ICD-10-CM | POA: Diagnosis not present

## 2023-02-16 DIAGNOSIS — I10 Essential (primary) hypertension: Secondary | ICD-10-CM | POA: Diagnosis not present

## 2023-02-16 DIAGNOSIS — I739 Peripheral vascular disease, unspecified: Secondary | ICD-10-CM | POA: Diagnosis not present

## 2023-02-16 LAB — GLUCOSE, CAPILLARY
Glucose-Capillary: 209 mg/dL — ABNORMAL HIGH (ref 70–99)
Glucose-Capillary: 161 mg/dL — ABNORMAL HIGH (ref 70–99)
Glucose-Capillary: 145 mg/dL — ABNORMAL HIGH (ref 70–99)
Glucose-Capillary: 185 mg/dL — ABNORMAL HIGH (ref 70–99)

## 2023-02-16 MED ORDER — CEFAZOLIN SODIUM-DEXTROSE 2-4 GM/100ML-% IV SOLN
2.0000 g | INTRAVENOUS | Status: AC
  Administered 2023-02-17: 2 g via INTRAVENOUS
  Filled 2023-02-16: qty 100

## 2023-02-16 MED ORDER — INSULIN GLARGINE-YFGN 100 UNIT/ML ~~LOC~~ SOLN
5.0000 [IU] | Freq: Two times a day (BID) | SUBCUTANEOUS | Status: DC
  Administered 2023-02-16 – 2023-02-18 (×3): 5 [IU] via SUBCUTANEOUS
  Filled 2023-02-16 (×5): qty 0.05

## 2023-02-16 MED ORDER — INSULIN ASPART 100 UNIT/ML IJ SOLN
3.0000 [IU] | Freq: Three times a day (TID) | INTRAMUSCULAR | Status: DC
Start: 2023-02-16 — End: 2023-02-18
  Administered 2023-02-16 (×2): 3 [IU] via SUBCUTANEOUS
  Filled 2023-02-16 (×2): qty 1

## 2023-02-16 MED ORDER — POVIDONE-IODINE 7.5 % EX SOLN
Freq: Once | CUTANEOUS | Status: DC
  Filled 2023-02-16: qty 118

## 2023-02-16 NOTE — Progress Notes (Signed)
Progress Note   Patient: Manuel [name]:[id number] DOB: 1945/11/02 DOA: 02/14/2023     2 DOS: the patient was seen and examined on 02/16/2023   Brief hospital course: Taken from H&P.  Manuel [name] is a 77 y.o. male with medical history significant of HFrEF with last EF of 25-30% (July 2024) 2/2 ischemic cardiomyopathy, CAD s/p CABG, peripheral artery disease s/p multiple interventions (most recently July 2024), insulin-dependent type 2 diabetes, hypertension, hyperlipidemia, OSA not on CPAP, who presents to the ED due to worsening generalized weakness over the past 6 weeks.He is also lost approximately 20 pounds during this time with a lack of appetite. He notes that during this time, he is also been unable to sleep.   He also experiencing worsening pain in his right leg and foul-smelling drainage from his ulcer on his right foot. He notes that the ulcer has been there for nearly 2 years now though.   On arrival to ED vital stable, WBC of 10.8, sodium 133, glucose 194. COVID-19, RSV and influenza PCR negative.  UA without evidence of infection. Right foot x-ray was obtained with no evidence of osteomyelitis.  Podiatry was consulted and patient was started on vancomycin/cefepime/Flagyl for concern of diabetic infected foot ulcer.  10/26: Vital stable.  Labs stable, leukocytosis resolved.  Podiatry sent, wound culture which is pending, preliminary blood cultures negative.  BNP elevated at 445 but much improved than a month ago when it was 1760.  MRI of foot Low-level marrow edema in the base of the proximal phalanx of the third toe and also in the plantar portion of the head of the third metatarsal. Early osteomyelitis is possible. Small focus of artifact in the subcutaneous tissues just deep to the cutaneous surface plantar to the third MTP joint, likely correlating with the radiographic density, this could be a small focus of glass or other foreign body. Possible local ulceration.   Continuing broad-spectrum antibiotics.  Transmetatarsal amputation was planned for Monday by podiatry.  Patient also has right groin wound, vascular surgery removed wound VAC recently.  They evaluated her and according to their note he has adequate perfusion but concern of right groin infection so cultures were obtained.  10/27: Hemodynamically stable, going for transmetatarsal amputation tomorrow.  Wound culture with gram-positive cocci in pairs and chains, gram-positive and gram-negative rods.  Assessment and Plan: * Diabetic foot ulcer (HCC) Patient is presenting with evidence of progression of chronic ulcer on the plantar aspect of the right foot.  Given location, consistent with a diabetic foot ulcer, although there is likely an ischemic component as well. MRI of foot with concern of osteo - Podiatry consulted; planning transmetatarsal amputation tomorrow - Vascular surgery was also consulted as he recent had a vascular procedure and wound VAC was removed.  Per vascular surgery patient has adequate circulation but concern of right groin wound infection so cultures were taken-growing multiple organisms pending final results -Continue with broad-spectrum antibiotics  Generalized weakness Patient notes gradually worsening fatigue and generalized weakness on exertion for the last 6 weeks, in addition to insomnia, poor appetite and weight loss.  There may be a component of underlying infection contributing to this, in addition to patient's comorbidities including HFrEF, however on further discussion, I suspect patient is depressed versus adjustment disorder. TSH and B12 normal, B1 pending - PT/OT after surgical evaluation -Started on mirtazapine 15 mg nightly and amitriptyline was discontinued  PAD (peripheral artery disease) (HCC) Complicated history PAD with multiple interventions most recently in July,  At which time he underwent bilateral femoral artery endarterectomies, embolectomy of the  right SFA, and multiple stent placement.  Complicated by poor wound healing requiring wound VAC, that was removed recently as outpatient Vascular surgery reevaluated him and there was a concern of right groin infection so cultures were taken, patient is already on broad-spectrum antibiotics - Continue DAPT and statin  Hypertension Patient was taking both losartan and Entresto, in addition to Coreg, but remains hypertensive in the ED.  - Discontinue home losartan - Restart home Entresto and Coreg - Hydralazine as needed for SBP over 180  Diabetes mellitus type 2, insulin dependent (HCC) - Hold home antiglycemic agents - SSI, moderate - Semglee 5 units at bedtime  Alcohol abuse Patient states that he drinks several beers a day in addition to liquor.  On previous admissions, no evidence of alcohol withdrawal.  He realizes that he needs to discontinue his alcohol use but has been struggling with it.  - CIWA monitoring without Ativan - Daily multi vitamin folic acid and thiamine  Chronic combined systolic and diastolic CHF (congestive heart failure) (HCC) At this time, patient appears euvolemic.  - Continue home GDMT and torsemide - Daily weights  Coronary artery disease with history of myocardial infarction without history of CABG - Continue home regimen      Subjective: Patient was sitting in chair when seen today.  No new concern  Physical Exam: Vitals:   02/15/23 0751 02/15/23 1552 02/15/23 1941 02/16/23 0804  BP: (!) 107/92 (!) 140/68 135/62 139/86  Pulse: 74 69 77 78  Resp: 19 18 17 18   Temp: 97.6 F (36.4 C) (!) 97.5 F (36.4 C) 98.5 F (36.9 C) 97.6 F (36.4 C)  TempSrc: Oral Oral Oral Oral  SpO2: 99% 98% 98% 99%   General.  Frail elderly man, in no acute distress. Pulmonary.  Lungs clear bilaterally, normal respiratory effort. CV.  Regular rate and rhythm, no JVD, rub or murmur. Abdomen.  Soft, nontender, nondistended, BS positive. CNS.  Alert and oriented .   No focal neurologic deficit. Extremities.  No edema, right foot with bandage. Psychiatry.  Judgment and insight appears normal.    Data Reviewed: Prior data reviewed  Family Communication: Discussed with son on phone.  Disposition: Status is: Inpatient Remains inpatient appropriate because: Severity of illness  Planned Discharge Destination:  TBD  Time spent: 50 minutes  This record has been created using Conservation officer, historic buildings. Errors have been sought and corrected,but may not always be located. Such creation errors do not reflect on the standard of care.   Author: Arnetha Courser, MD 02/16/2023 4:11 PM  For on call review [email or web address].

## 2023-02-16 NOTE — Assessment & Plan Note (Signed)
Patient is presenting with evidence of progression of chronic ulcer on the plantar aspect of the right foot.  Given location, consistent with a diabetic foot ulcer, although there is likely an ischemic component as well. MRI of foot with concern of osteo.,  Cultures with Morganella which shows resistance to ampicillin and Augmentin only. 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  Per vascular surgery patient has adequate circulation but concern of right groin wound infection so cultures were taken-growing pansensitive E. Coli. -Continue with broad-spectrum antibiotics

## 2023-02-17 ENCOUNTER — Inpatient Hospital Stay: Payer: Medicare Other

## 2023-02-17 ENCOUNTER — Encounter: Payer: Self-pay | Admitting: Internal Medicine

## 2023-02-17 ENCOUNTER — Other Ambulatory Visit: Payer: Self-pay

## 2023-02-17 ENCOUNTER — Inpatient Hospital Stay: Payer: Medicare Other | Admitting: General Practice

## 2023-02-17 ENCOUNTER — Encounter
Admission: EM | Disposition: A | Discharge status: Home Health | Payer: Self-pay | Source: Home / Self Care | Attending: Internal Medicine

## 2023-02-17 DIAGNOSIS — E11621 Type 2 diabetes mellitus with foot ulcer: Secondary | ICD-10-CM | POA: Diagnosis not present

## 2023-02-17 DIAGNOSIS — I10 Essential (primary) hypertension: Secondary | ICD-10-CM | POA: Diagnosis not present

## 2023-02-17 DIAGNOSIS — I739 Peripheral vascular disease, unspecified: Secondary | ICD-10-CM | POA: Diagnosis not present

## 2023-02-17 DIAGNOSIS — R531 Weakness: Secondary | ICD-10-CM | POA: Diagnosis not present

## 2023-02-17 HISTORY — PX: TRANSMETATARSAL AMPUTATION: SHX6197

## 2023-02-17 LAB — GLUCOSE, CAPILLARY
Glucose-Capillary: 169 mg/dL — ABNORMAL HIGH (ref 70–99)
Glucose-Capillary: 184 mg/dL — ABNORMAL HIGH (ref 70–99)
Glucose-Capillary: 192 mg/dL — ABNORMAL HIGH (ref 70–99)
Glucose-Capillary: 202 mg/dL — ABNORMAL HIGH (ref 70–99)
Glucose-Capillary: 410 mg/dL — ABNORMAL HIGH (ref 70–99)
Glucose-Capillary: 468 mg/dL — ABNORMAL HIGH (ref 70–99)
Glucose-Capillary: 486 mg/dL — ABNORMAL HIGH (ref 70–99)
Glucose-Capillary: 519 mg/dL (ref 70–99)

## 2023-02-17 LAB — BASIC METABOLIC PANEL
Anion gap: 9 (ref 5–15)
BUN: 33 mg/dL — ABNORMAL HIGH (ref 8–23)
CO2: 22 mmol/L (ref 22–32)
Calcium: 9 mg/dL (ref 8.9–10.3)
Chloride: 102 mmol/L (ref 98–111)
Creatinine, Ser: 0.8 mg/dL (ref 0.61–1.24)
GFR, Estimated: >60 mL/min (ref >60)
Glucose, Bld: 211 mg/dL — ABNORMAL HIGH (ref 70–99)
Potassium: 3.9 mmol/L (ref 3.5–5.1)
Sodium: 133 mmol/L — ABNORMAL LOW (ref 135–145)

## 2023-02-17 LAB — SURGICAL PCR SCREEN
MRSA, PCR: NEGATIVE
Staphylococcus aureus: POSITIVE — AB

## 2023-02-17 SURGERY — AMPUTATION, FOOT, TRANSMETATARSAL
Anesthesia: General | Site: Toe | Laterality: Right

## 2023-02-17 MED ORDER — MUPIROCIN 2 % EX OINT
1.0000 | TOPICAL_OINTMENT | Freq: Two times a day (BID) | CUTANEOUS | Status: DC
  Administered 2023-02-17 – 2023-02-19 (×4): 1 via NASAL
  Filled 2023-02-17: qty 22

## 2023-02-17 MED ORDER — ACETAMINOPHEN 10 MG/ML IV SOLN
INTRAVENOUS | Status: AC
  Filled 2023-02-17: qty 100

## 2023-02-17 MED ORDER — ONDANSETRON HCL 4 MG/2ML IJ SOLN
INTRAMUSCULAR | Status: DC | PRN
  Administered 2023-02-17: 4 mg via INTRAVENOUS

## 2023-02-17 MED ORDER — LIDOCAINE HCL (PF) 2 % IJ SOLN
INTRAMUSCULAR | Status: AC
  Filled 2023-02-17: qty 5

## 2023-02-17 MED ORDER — ACETAMINOPHEN 10 MG/ML IV SOLN
INTRAVENOUS | Status: DC | PRN
  Administered 2023-02-17: 1000 mg via INTRAVENOUS

## 2023-02-17 MED ORDER — OXYCODONE HCL 5 MG PO TABS: 5.0000 mg | ORAL_TABLET | Freq: Once | ORAL | Status: DC | PRN

## 2023-02-17 MED ORDER — CHLORHEXIDINE GLUCONATE CLOTH 2 % EX PADS
6.0000 | MEDICATED_PAD | Freq: Every day | CUTANEOUS | Status: DC
  Administered 2023-02-17: 6 via TOPICAL

## 2023-02-17 MED ORDER — PROPOFOL 10 MG/ML IV BOLUS
INTRAVENOUS | Status: AC
  Filled 2023-02-17: qty 40

## 2023-02-17 MED ORDER — ONDANSETRON HCL 4 MG/2ML IJ SOLN
INTRAMUSCULAR | Status: AC
  Filled 2023-02-17: qty 2

## 2023-02-17 MED ORDER — FENTANYL CITRATE (PF) 100 MCG/2ML IJ SOLN
INTRAMUSCULAR | Status: DC | PRN
  Administered 2023-02-17: 50 ug via INTRAVENOUS
  Administered 2023-02-17: 25 ug via INTRAVENOUS

## 2023-02-17 MED ORDER — INSULIN ASPART 100 UNIT/ML IJ SOLN
10.0000 [IU] | Freq: Once | INTRAMUSCULAR | Status: AC
  Administered 2023-02-17: 10 [IU] via INTRAVENOUS
  Filled 2023-02-17 (×3): qty 0.1

## 2023-02-17 MED ORDER — LIDOCAINE HCL (CARDIAC) PF 100 MG/5ML IV SOSY
PREFILLED_SYRINGE | INTRAVENOUS | Status: DC | PRN
  Administered 2023-02-17: 80 mg via INTRAVENOUS

## 2023-02-17 MED ORDER — NOREPINEPHRINE 4 MG/250ML-% IV SOLN
INTRAVENOUS | Status: AC
  Filled 2023-02-17: qty 250

## 2023-02-17 MED ORDER — PROPOFOL 10 MG/ML IV BOLUS
INTRAVENOUS | Status: DC | PRN
  Administered 2023-02-17: 80 mg via INTRAVENOUS
  Administered 2023-02-17: 20 mg via INTRAVENOUS

## 2023-02-17 MED ORDER — LACTATED RINGERS IV SOLN: INTRAVENOUS | Status: DC | PRN

## 2023-02-17 MED ORDER — DEXAMETHASONE SODIUM PHOSPHATE 10 MG/ML IJ SOLN
INTRAMUSCULAR | Status: DC | PRN
  Administered 2023-02-17: 5 mg via INTRAVENOUS

## 2023-02-17 MED ORDER — 0.9 % SODIUM CHLORIDE (POUR BTL) OPTIME
TOPICAL | Status: DC | PRN
  Administered 2023-02-17: 500 mL

## 2023-02-17 MED ORDER — KETOROLAC TROMETHAMINE 30 MG/ML IJ SOLN
INTRAMUSCULAR | Status: AC
Start: 2023-02-17 — End: ?
  Filled 2023-02-17: qty 1

## 2023-02-17 MED ORDER — BUPIVACAINE HCL 0.5 % IJ SOLN
INTRAMUSCULAR | Status: DC | PRN
  Administered 2023-02-17: 20 mL

## 2023-02-17 MED ORDER — FENTANYL CITRATE (PF) 100 MCG/2ML IJ SOLN: 25.0000 ug | INTRAMUSCULAR | Status: DC | PRN

## 2023-02-17 MED ORDER — CEFAZOLIN SODIUM-DEXTROSE 2-4 GM/100ML-% IV SOLN
INTRAVENOUS | Status: AC
  Filled 2023-02-17: qty 100

## 2023-02-17 MED ORDER — BUPIVACAINE LIPOSOME 1.3 % IJ SUSP
INTRAMUSCULAR | Status: DC | PRN
  Administered 2023-02-17: 10 mL

## 2023-02-17 MED ORDER — NOREPINEPHRINE 4 MG/250ML-% IV SOLN
INTRAVENOUS | Status: DC | PRN
  Administered 2023-02-17: 2 ug/kg/min via INTRAVENOUS

## 2023-02-17 MED ORDER — DEXAMETHASONE SODIUM PHOSPHATE 10 MG/ML IJ SOLN
INTRAMUSCULAR | Status: AC
  Filled 2023-02-17: qty 1

## 2023-02-17 MED ORDER — KETOROLAC TROMETHAMINE 30 MG/ML IJ SOLN
INTRAMUSCULAR | Status: DC | PRN
  Administered 2023-02-17: 15 mg via INTRAVENOUS

## 2023-02-17 MED ORDER — FENTANYL CITRATE (PF) 100 MCG/2ML IJ SOLN
INTRAMUSCULAR | Status: AC
  Filled 2023-02-17: qty 2

## 2023-02-17 MED ORDER — BUPIVACAINE LIPOSOME 1.3 % IJ SUSP
INTRAMUSCULAR | Status: AC
  Filled 2023-02-17: qty 10

## 2023-02-17 MED ORDER — OXYCODONE HCL 5 MG/5ML PO SOLN
5.0000 mg | Freq: Once | ORAL | Status: DC | PRN
Start: 2023-02-17 — End: 2023-02-17

## 2023-02-17 SURGICAL SUPPLY — 50 items
BAG COUNTER SPONGE SURGICOUNT (BAG) IMPLANT
BAG SPNG CNTER NS LX DISP (BAG) ×1
BLADE OSCILLATING/SAGITTAL (BLADE) ×1
BLADE SURG 15 STRL LF DISP TIS (BLADE) ×2 IMPLANT
BLADE SURG 15 STRL SS (BLADE) ×2
BLADE SW THK.38XMED LNG THN (BLADE) ×1 IMPLANT
BNDG CMPR 5X4 CHSV STRCH STRL (GAUZE/BANDAGES/DRESSINGS) ×1
BNDG CMPR 5X4 KNIT ELC UNQ LF (GAUZE/BANDAGES/DRESSINGS) ×1
BNDG CMPR 6 X 5 YARDS HK CLSR (GAUZE/BANDAGES/DRESSINGS) ×1
BNDG CMPR 75X41 PLY HI ABS (GAUZE/BANDAGES/DRESSINGS) ×1
BNDG COHESIVE 4X5 TAN STRL LF (GAUZE/BANDAGES/DRESSINGS) ×1 IMPLANT
BNDG ELASTIC 4INX 5YD STR LF (GAUZE/BANDAGES/DRESSINGS) ×1 IMPLANT
BNDG ELASTIC 6INX 5YD STR LF (GAUZE/BANDAGES/DRESSINGS) ×1 IMPLANT
BNDG ESMARCH 4 X 12 STRL LF (GAUZE/BANDAGES/DRESSINGS) ×1
BNDG ESMARCH 4X12 STRL LF (GAUZE/BANDAGES/DRESSINGS) ×1 IMPLANT
BNDG GAUZE DERMACEA FLUFF 4 (GAUZE/BANDAGES/DRESSINGS) ×2 IMPLANT
BNDG GZE DERMACEA 4 6PLY (GAUZE/BANDAGES/DRESSINGS) ×2
BNDG STRETCH 4X75 STRL LF (GAUZE/BANDAGES/DRESSINGS) ×1 IMPLANT
DRAPE FLUOR MINI C-ARM 54X84 (DRAPES) ×1 IMPLANT
ELECT REM PT RETURN 9FT ADLT (ELECTROSURGICAL) ×1
ELECTRODE REM PT RTRN 9FT ADLT (ELECTROSURGICAL) ×1 IMPLANT
GAUZE SPONGE 4X4 12PLY STRL (GAUZE/BANDAGES/DRESSINGS) ×1 IMPLANT
GAUZE XEROFORM 1X8 LF (GAUZE/BANDAGES/DRESSINGS) ×2 IMPLANT
GLOVE BIO SURGEON STRL SZ7 (GLOVE) ×1 IMPLANT
GLOVE INDICATOR 7.0 STRL GRN (GLOVE) ×1 IMPLANT
GLOVE INDICATOR 7.5 STRL GRN (GLOVE) ×1 IMPLANT
GOWN STRL REUS W/ TWL LRG LVL3 (GOWN DISPOSABLE) ×2 IMPLANT
GOWN STRL REUS W/TWL LRG LVL3 (GOWN DISPOSABLE) ×2
KIT TURNOVER KIT A (KITS) ×1 IMPLANT
MANIFOLD NEPTUNE II (INSTRUMENTS) ×1 IMPLANT
NDL HYPO 25X1 1.5 SAFETY (NEEDLE) ×2 IMPLANT
NDL SAFETY ECLIPSE 18X1.5 (NEEDLE) ×1 IMPLANT
NEEDLE HYPO 25X1 1.5 SAFETY (NEEDLE) ×2 IMPLANT
NS IRRIG 500ML POUR BTL (IV SOLUTION) ×1 IMPLANT
PACK EXTREMITY ARMC (MISCELLANEOUS) ×1 IMPLANT
SOL PREP PVP 2OZ (MISCELLANEOUS) ×2
SOLUTION PREP PVP 2OZ (MISCELLANEOUS) ×1 IMPLANT
STAPLER SKIN PROX 35W (STAPLE) ×1 IMPLANT
STOCKINETTE M/LG 89821 (MISCELLANEOUS) ×1 IMPLANT
STRIP CLOSURE SKIN 1/4X4 (GAUZE/BANDAGES/DRESSINGS) ×1 IMPLANT
SUT ETHILON 2 0 FSLX (SUTURE) ×1 IMPLANT
SUT ETHILON 3-0 FS-10 30 BLK (SUTURE) ×2
SUT VIC AB 2-0 SH 27 (SUTURE) ×1
SUT VIC AB 2-0 SH 27XBRD (SUTURE) ×1 IMPLANT
SUT VIC AB 3-0 SH 27 (SUTURE) ×2
SUT VIC AB 3-0 SH 27X BRD (SUTURE) ×2 IMPLANT
SUTURE EHLN 3-0 FS-10 30 BLK (SUTURE) ×2 IMPLANT
SYR 10ML LL (SYRINGE) ×2 IMPLANT
TRAP FLUID SMOKE EVACUATOR (MISCELLANEOUS) ×1 IMPLANT
WATER STERILE IRR 500ML POUR (IV SOLUTION) ×1 IMPLANT

## 2023-02-17 NOTE — Progress Notes (Signed)
Pharmacy Antibiotic Note  Manuel [name] is a 77 y.o. male presenting on 02/14/2023 with generalized weakness and worsening pain of wound on right foot. PMH significant for CAD w/ STEMI s/p DES x2 (2011), HTN, T2DM, and PAD. 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 Patient remains afebrile with WBC 10.1. Pharmacy has been consulted for vancomycin and cefepime dosing.  Plan: Continue vancomycin 750 mg IV every 12 hours (eAUC 403.1, Scr 0.8, IBW, Vd 0.72, Cmin 11.9) Continue cefepime 2 g IV every 8 hours Patient is also on metronidazole 500 mg IV q12h Monitor renal function, clinical status, culture data, and LOT Weight: 80.1 kg (176 lb 9.4 oz)  Temp (24hrs), Avg:98.3 F (36.8 C), Min:98 F (36.7 C), Max:98.4 F (36.9 C)  Recent Labs  Lab 02/14/23 1157 02/15/23 0513 02/17/23 0532  WBC 10.8* 8.2  --   CREATININE 0.96 0.85 0.80    Estimated Creatinine Clearance: 72.4 mL/min (by C-G formula based on SCr of 0.8 mg/dL).    No Known Allergies  Antimicrobials this admission: Vancomycin 10/25 >>  Cefepime 10/25 >>   Dose adjustments this admission: N/A  Microbiology results: 10/25 BCx: NGTD 10/25 Wound cx: gram-positive cocci in pairs and chains, gram-positive and gram-negative rods; moderate Morganella morgani  Thank you for involving pharmacy in this patient's care.   Paulita Fujita, PharmD Clinical Pharmacist 02/17/2023 8:33 AM

## 2023-02-17 NOTE — Anesthesia Procedure Notes (Signed)
Procedure Name: LMA Insertion Date/Time: 02/17/2023 3:49 PM  Performed by: Elisabeth Pigeon, CRNAPre-anesthesia Checklist: Patient identified, Emergency Drugs available, Suction available and Patient being monitored Patient Re-evaluated:Patient Re-evaluated prior to induction Oxygen Delivery Method: Circle system utilized Preoxygenation: Pre-oxygenation with 100% oxygen Induction Type: IV induction Ventilation: Mask ventilation without difficulty LMA: LMA inserted LMA Size: 5.0 Tube type: Oral Number of attempts: 1 Airway Equipment and Method: Oral airway Placement Confirmation: positive ETCO2 and breath sounds checked- equal and bilateral Tube secured with: Tape Dental Injury: Teeth and Oropharynx as per pre-operative assessment

## 2023-02-17 NOTE — Anesthesia Postprocedure Evaluation (Signed)
Anesthesia Post Note  Patient: Manuel [name]  Procedure(s) Performed: TRANSMETATARSAL AMPUTATION (Right: Toe)  Patient location during evaluation: PACU Anesthesia Type: General Level of consciousness: awake and alert Pain management: pain level controlled Vital Signs Assessment: post-procedure vital signs reviewed and stable Respiratory status: spontaneous breathing, nonlabored ventilation, respiratory function stable and patient connected to nasal cannula oxygen Cardiovascular status: blood pressure returned to baseline and stable Postop Assessment: no apparent nausea or vomiting Anesthetic complications: no   No notable events documented.   Last Vitals:  Vitals:   02/17/23 1800 02/17/23 1805  BP: (!) 148/78 (!) 165/79  Pulse: 70 66  Resp:  16  Temp:  37 C  SpO2: 95% 98%    Last Pain:  Vitals:   02/17/23 2030  TempSrc:   PainSc: 0-No pain                 Louie Boston

## 2023-02-17 NOTE — Progress Notes (Signed)
Progress Note   Patient: Manuel [name] ZOX:[id number] DOB: Jan 18, 1946 DOA: 02/14/2023     3 DOS: the patient was seen and examined on 02/17/2023   Brief hospital course: Taken from H&P.  Manuel [name] is a 77 y.o. male with medical history significant of HFrEF with last EF of 25-30% (July 2024) 2/2 ischemic cardiomyopathy, CAD s/p CABG, peripheral artery disease s/p multiple interventions (most recently July 2024), insulin-dependent type 2 diabetes, hypertension, hyperlipidemia, OSA not on CPAP, who presents to the ED due to worsening generalized weakness over the past 6 weeks.He is also lost approximately 20 pounds during this time with a lack of appetite. He notes that during this time, he is also been unable to sleep.   He also experiencing worsening pain in his right leg and foul-smelling drainage from his ulcer on his right foot. He notes that the ulcer has been there for nearly 2 years now though.   On arrival to ED vital stable, WBC of 10.8, sodium 133, glucose 194. COVID-19, RSV and influenza PCR negative.  UA without evidence of infection. Right foot x-ray was obtained with no evidence of osteomyelitis.  Podiatry was consulted and patient was started on vancomycin/cefepime/Flagyl for concern of diabetic infected foot ulcer.  10/26: Vital stable.  Labs stable, leukocytosis resolved.  Podiatry sent, wound culture which is pending, preliminary blood cultures negative.  BNP elevated at 445 but much improved than a month ago when it was 1760.  MRI of foot Low-level marrow edema in the base of the proximal phalanx of the third toe and also in the plantar portion of the head of the third metatarsal. Early osteomyelitis is possible. Small focus of artifact in the subcutaneous tissues just deep to the cutaneous surface plantar to the third MTP joint, likely correlating with the radiographic density, this could be a small focus of glass or other foreign body. Possible local ulceration.   Continuing broad-spectrum antibiotics.  Transmetatarsal amputation was planned for Monday by podiatry.  Patient also has right groin wound, vascular surgery removed wound VAC recently.  They evaluated her and according to their note he has adequate perfusion but concern of right groin infection so cultures were obtained.  10/27: Hemodynamically stable, going for transmetatarsal amputation tomorrow.  Wound culture with gram-positive cocci in pairs and chains, gram-positive and gram-negative rods.  10/28: Hemodynamically stable, groin wound with pansensitive E. coli and rare Staph aureus, foot wound with Morganella Morganii which shows resistance to ampicillin and Augmentin only,  Going for transmetatarsal amputation later today.  ID was also consulted  Assessment and Plan: * Diabetic foot ulcer (HCC) Patient is presenting with evidence of progression of chronic ulcer on the plantar aspect of the right foot.  Given location, consistent with a diabetic foot ulcer, although there is likely an ischemic component as well. MRI of foot with concern of osteo.,  Cultures with Morganella which shows resistance to ampicillin and Augmentin only. - Podiatry consulted; going for transmetatarsal amputation today - Vascular surgery was also consulted as he recent had a vascular procedure and wound VAC was removed.  Per vascular surgery patient has adequate circulation but concern of right groin wound infection so cultures were taken-growing pansensitive E. Coli. -Continue with broad-spectrum antibiotics  Generalized weakness Patient notes gradually worsening fatigue and generalized weakness on exertion for the last 6 weeks, in addition to insomnia, poor appetite and weight loss.  There may be a component of underlying infection contributing to this, in addition to patient's comorbidities including  HFrEF, however on further discussion, I suspect patient is depressed versus adjustment disorder. TSH and B12 normal,  B1 pending - PT/OT after surgical evaluation -Started on mirtazapine 15 mg nightly and amitriptyline was discontinued  PAD (peripheral artery disease) (HCC) Complicated history PAD with multiple interventions most recently in July, At which time he underwent bilateral femoral artery endarterectomies, embolectomy of the right SFA, and multiple stent placement.  Complicated by poor wound healing requiring wound VAC, that was removed recently as outpatient Vascular surgery reevaluated him and there was a concern of right groin infection so cultures were taken, patient is already on broad-spectrum antibiotics - Continue DAPT and statin  Hypertension Patient was taking both losartan and Entresto, in addition to Coreg, but remains hypertensive in the ED.  - Discontinue home losartan - Restart home Entresto and Coreg - Hydralazine as needed for SBP over 180  Diabetes mellitus type 2, insulin dependent (HCC) - Hold home antiglycemic agents - SSI, moderate - Semglee 5 units at bedtime  Alcohol abuse Patient states that he drinks several beers a day in addition to liquor.  On previous admissions, no evidence of alcohol withdrawal.  He realizes that he needs to discontinue his alcohol use but has been struggling with it.  - CIWA monitoring without Ativan - Daily multi vitamin folic acid and thiamine  Chronic combined systolic and diastolic CHF (congestive heart failure) (HCC) At this time, patient appears euvolemic.  - Continue home GDMT and torsemide - Daily weights  Coronary artery disease with history of myocardial infarction without history of CABG - Continue home regimen      Subjective: Patient was lying down comfortably when seen today.  Awaiting for surgery.  No new concern.  Physical Exam: Vitals:   02/17/23 0451 02/17/23 0739 02/17/23 1128 02/17/23 1517  BP:  (!) 164/84 137/68 (!) 100/57  Pulse:  79 72 70  Resp:  18 17 15   Temp:  98.4 F (36.9 C) 98.8 F (37.1 C) (!)  97.1 F (36.2 C)  TempSrc:  Oral Oral Temporal  SpO2:  98% 99% 96%  Weight: 80.1 kg      General.  Frail elderly man, in no acute distress. Pulmonary.  Lungs clear bilaterally, normal respiratory effort. CV.  Regular rate and rhythm, no JVD, rub or murmur. Abdomen.  Soft, nontender, nondistended, BS positive. CNS.  Alert and oriented .  No focal neurologic deficit. Extremities.  No edema, no cyanosis, pulses intact and symmetrical.  Right foot with bandage. Psychiatry.  Judgment and insight appears normal.     Data Reviewed: Prior data reviewed  Family Communication: Discussed with patient  Disposition: Status is: Inpatient Remains inpatient appropriate because: Severity of illness  Planned Discharge Destination:  TBD  Time spent: 45 minutes  This record has been created using Conservation officer, historic buildings. Errors have been sought and corrected,but may not always be located. Such creation errors do not reflect on the standard of care.   Author: Arnetha Courser, MD 02/17/2023 3:31 PM  For on call review [email or web address].

## 2023-02-17 NOTE — Anesthesia Preprocedure Evaluation (Signed)
Anesthesia Evaluation  Patient identified by MRN, date of birth, ID band Patient awake    Reviewed: Allergy & Precautions, H&P , NPO status , Patient's Chart, lab work & pertinent test results  History of Anesthesia Complications Negative for: history of anesthetic complications  Airway Mallampati: III  TM Distance: >3 FB Neck ROM: full    Dental  (+) Chipped   Pulmonary sleep apnea , Patient abstained from smoking.   Pulmonary exam normal        Cardiovascular Exercise Tolerance: Poor hypertension, (-) angina negative cardio ROS Normal cardiovascular exam  ECHO 11/14/22:  1. Left ventricular ejection fraction, by estimation, is 25 to 30%. The  left ventricle has severely decreased function. The left ventricle  demonstrates regional wall motion abnormalities (see scoring  diagram/findings for description). Left ventricular  diastolic parameters were normal.   2. Right ventricular systolic function is normal. The right ventricular  size is normal.   3. The mitral valve is normal in structure. Mild to moderate mitral valve  regurgitation. No evidence of mitral stenosis.   4. Tricuspid valve regurgitation is mild to moderate.   5. The aortic valve is normal in structure. Aortic valve regurgitation is  not visualized. No aortic stenosis is present.   6. The inferior vena cava is normal in size with greater than 50%  respiratory variability, suggesting right atrial pressure of 3 mmHg.    Bilateral carotid artery stenosis  EKG 7/21: Nonspecific interventricular conduction delay  MPS 10/2018: LVEF= 45%  FINDINGS:  Regional wall motion:  demonstrates  hypokinesis of the inferior wall.  The overall quality of the study is fair.   Artifacts noted: no  Left ventricular cavity: normal.     Neuro/Psych negative neurological ROS  negative psych ROS   GI/Hepatic negative GI ROS, Neg liver ROS,,,(+)       alcohol use and  marijuana use  Endo/Other  negative endocrine ROSdiabetes, Well Controlled, Type 2, Insulin Dependent    Renal/GU      Musculoskeletal  (+) Arthritis ,    Abdominal Normal abdominal exam  (+)   Peds  Hematology negative hematology ROS (+)   Anesthesia Other Findings Past Medical History: 04/19/2010: Acute ST elevation myocardial infarction (STEMI) of  inferior wall (HCC)     Comment:  a.) transfered from [facility] to [facility] --> LHC/PCI (very               difficult procedure) --> 3.0 x 23 mm and 3.0 x 12 mm               Xience stents to RCA No date: Allergies No date: Arthritis No date: Benign essential hypertension 05/08/2021: Bilateral carotid artery disease (HCC)     Comment:  a.) carotid doppler 05/08/2021: 1-39% BICA 04/19/2010: CAD (coronary artery disease)     Comment:  a.) inferior STEMI 04/19/2010 --> LHC/PCI: 50-70% pD1,               80% pRI, 90/90/90% RCA (overlapping 3.0 x 23 and 3.0 x 12              mm Xience DES); b.) MV 11/10/2018: fixed minimally               reversible inferior perfusion defect No date: Cellulitis of foot No date: DDD (degenerative disc disease), cervical No date: Diabetes mellitus type 2, insulin dependent (HCC) No date: Diverticulosis No date: Full dentures No date: Gout No date: Hard of hearing 2022: History of bilateral  cataract extraction No date: History of ETOH abuse No date: Hyperlipidemia 04/19/2010: Ischemic cardiomyopathy     Comment:  a.) TTE 04/19/2010: 40%; b.) TTE 04/20/2014: EF >55%,               mild RVE, triv PR, mild MR/TR, G1DD; c.) TTE 11/10/2018:               EF 45%, inf HK, mild RVE, triv TR/PR, mild MR, G1DD; d.)               TTE 11/14/2022: EF 25-30%, basal anteroseptal, apical               lateral, apical septal, and apex AK, mid anterolateral               HK, mild-mod MR/TR No date: Long term current use of aspirin No date: Long term current use of clopidogrel No date: Lumbar degenerative disc  disease No date: Lumbar radiculopathy No date: Lumbar vertebral fracture (chronic superior endplate of L1) No date: OSA (obstructive sleep apnea)     Comment:  a.) unable to tolerate nocturnal PAP therapy No date: Peripheral artery disease (HCC)     Comment:  a.) stenting 05/14/21: 12 mm x 12 cm LifeStent RIGHT dis               SFA/prox pop; b.) s/p cath directed thrombolysis RIGHT               SFA/pop 06/14/21; c.) s/p mech thrombectomy + stenting               06/15/21: 8 mm x 25cm & 8 mm x 7.5cm Viabahn; d.) s/p               BILAT CFA, profunda femoris, SFA endarterectomies +               fogarty embolectomy + stenting 11/15/22: 12mm x 58mm               Lifestream BILAT CIAs, 14 mm x 6 cm Lifestream & 13 mm x               5 cm Viabahn LEFT EIA No date: Peripheral neuropathy No date: Umbilical hernia  Past Surgical History: 11/18/2022: AMPUTATION; Right     Comment:  Procedure: AMPUTATION 4TH AND 5TH RAY;  Surgeon: Linus Galas, [name];  Location: [facility];  Service:               Orthopedics/Podiatry;  Laterality: Right;  4th and 5th               toe 01/09/2023: APPLICATION OF WOUND VAC; Right     Comment:  Procedure: APPLICATION OF WOUND VAC;  Surgeon: Annice Needy, MD;  Location: [facility];  Service: Vascular;                Laterality: Right; 03/14/2021: CATARACT EXTRACTION W/PHACO; Right     Comment:  Procedure: CATARACT EXTRACTION PHACO AND INTRAOCULAR               LENS PLACEMENT (IOC) RIGHT DIABETIC;  Surgeon:               Lockie Mola, MD;  Location: [facility];  Service: Ophthalmology;  Laterality: Right;                Diabetic 16.78 01:39.9 03/28/2021: CATARACT EXTRACTION W/PHACO; Left     Comment:  Procedure: CATARACT EXTRACTION PHACO AND INTRAOCULAR               LENS PLACEMENT (IOC) LEFT DIABETIC 6.93 01:22.0;                Surgeon: Lockie Mola, MD;  Location: [facility];   Service: Ophthalmology;  Laterality: Left;              Diabetic No date: COLONOSCOPY 03/2010: CORONARY ANGIOPLASTY WITH STENT PLACEMENT     Comment:  Procedure: CORONARY ANGIOPLASTY WITH STENT PLACEMENT;               Location: [name] 11/15/2022: ENDARTERECTOMY FEMORAL; Bilateral     Comment:  Procedure: BILATERAL COMMON FEMORAL PROFUNDA FEMORIS AND              SUPERFICIAL FEMORAL ARTERY ENDARTECTOMIES, RIGHT FOGARTY               EMBOLECTOMY OF THE RIGHT SFA  AND  POPLITEAL ARTERIES.               AORTAGRAM AND RIGHT LOWER EXTREMITY ANGIOGRAM.;  Surgeon:              Annice Needy, MD;  Location: [facility];  Service:               Vascular;  Laterality: Bilateral; 11/15/2022: INSERTION OF ILIAC STENT; Bilateral     Comment:  Procedure: BILATERAL STENT INSERTION IN BILATERAL                COMMON ILIAC ARTERY, STENT INSERTION OF LEFT EXTERNAL               ILIAC ARTERY. ANGIOPLASTY RIGHT TIBIAL  AND POPLITEAL               ARTERY.;  Surgeon: Annice Needy, MD;  Location: [facility];              Service: Vascular;  Laterality: Bilateral; 05/14/2021: LOWER EXTREMITY ANGIOGRAPHY; Right     Comment:  Procedure: LOWER EXTREMITY ANGIOGRAPHY;  Surgeon: Annice Needy, MD;  Location: [facility];  Service:               Cardiovascular;  Laterality: Right; 06/14/2021: LOWER EXTREMITY ANGIOGRAPHY; Right     Comment:  Procedure: Lower Extremity Angiography;  Surgeon: Annice Needy, MD;  Location: [facility];  Service:               Cardiovascular;  Laterality: Right; 11/11/2022: LOWER EXTREMITY ANGIOGRAPHY; Right     Comment:  Procedure: Lower Extremity Angiography;  Surgeon: Annice Needy, MD;  Location: [facility];  Service:               Cardiovascular;  Laterality: Right; 06/15/2021: LOWER EXTREMITY INTERVENTION; Right     Comment:  Procedure: LOWER EXTREMITY INTERVENTION;  Surgeon: Wyn Quaker,  Marlow Baars, MD;  Location: [facility]  [facility];  Service:               Cardiovascular;  Laterality: Right; No date: TONSILLECTOMY 01/09/2023: WOUND DEBRIDEMENT; Right     Comment:  Procedure: DEBRIDEMENT WOUND;  Surgeon: Annice Needy,               MD;  Location: [facility];  Service: Vascular;  Laterality:              Right;  BMI    Body Mass Index: 26.85 kg/m      Reproductive/Obstetrics negative OB ROS                              Anesthesia Physical Anesthesia Plan  ASA: 3  Anesthesia Plan: General LMA   Post-op Pain Management: Ofirmev IV (intra-op)* and Toradol IV (intra-op)*   Induction: Intravenous  PONV Risk Score and Plan: 2 and Dexamethasone, Ondansetron and Treatment may vary due to age or medical condition  Airway Management Planned: Natural Airway  Additional Equipment:   Intra-op Plan:   Post-operative Plan: Extubation in OR  Informed Consent: I have reviewed the patients History and Physical, chart, labs and discussed the procedure including the risks, benefits and alternatives for the proposed anesthesia with the patient or authorized representative who has indicated his/her understanding and acceptance.   Patient has DNR.  Suspend DNR and Discussed DNR with patient.   Dental Advisory Given  Plan Discussed with: Anesthesiologist, CRNA and Surgeon  Anesthesia Plan Comments:          Anesthesia Quick Evaluation

## 2023-02-17 NOTE — H&P (Signed)
HISTORY AND PHYSICAL INTERVAL NOTE:  02/17/2023  3:20 PM  Manuel [name]  has presented today for surgery, with the diagnosis of Osteomyelitis.  The various methods of treatment have been discussed with the patient.  No guarantees were given.  After consideration of risks, benefits and other options for treatment, the patient has consented to surgery.  I have reviewed the patients' chart and labs.    PROCEDURE: RIGHT TRANSMETATARSAL AMPUTATION  A history and physical examination was performed in the hospital.  The patient was reexamined.  There have been no changes to this history and physical examination.  Rosetta Posner, [name]

## 2023-02-17 NOTE — Plan of Care (Signed)

## 2023-02-17 NOTE — Op Note (Addendum)
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  Right osteomyelitis acute to 3rd MTPJ  PATHOLOGY/SPECIMEN(S):  Right forefoot path specimen  INDICATIONS:   Manuel [name] is a 77 y.o. male who presents with a nonhealing wound to the right foot near the third metatarsal phalangeal joint area.  Patient appeared to clinically have bone exposed to the area and had x-ray imaging as well as MRI which confirmed acute osteomyelitis.  Patient has previously had partial fourth and fifth ray amputations and has significant history of peripheral vascular disease.  He has been optimized with his circulation for healing.  All treatment options were discussed with the patient and both conservative and surgical attempts at correction including potential risks and complications and at this time patient is elected for surgery consisting of right transmetatarsal amputation.  No guarantees given.  Consent obtained prior to procedure.  DESCRIPTION: After obtaining full informed written consent, the patient was brought back to the operating room and placed supine upon the operating table.  The patient received IV antibiotics prior to induction.  After obtaining adequate anesthesia, the patient was prepped and draped in the standard fashion.  20 cc of half percent Marcaine plain was injected about the right ankle in an ankle block.  Attention was directed to the right forefoot where a fishmouth type of incision was made around digits 1, 2, 3 and the ulceration was excised in a V-type pattern and included in the flaps.  The incision was made straight to bone.  An  extensor tenotomy and capsulotomy was performed followed by release the collateral and suspensory admitted tarsal phalangeal joints 1 through 3.  The plantar plate and flexor tendons were also resected and digits 1 through 3 were removed and passed off in the operative site.  The fourth metatarsal was also able to be visualized at the area of where it was transected prior.  Circumferential dissection was then performed around metatarsals 1 through 3.  Osteotomies were then performed through metatarsals 1 through 4 creating the appropriate parabola with the appropriate beveling.  Metatarsals 1 through 4 were then passed off the operative site.  C-arm imaging was used to verify parabola position which appeared to be excellent.  Plantar plates were then resected and the areas as well as the sesamoid apparatus to the first metatarsal phalange joint.  This was all passed off in the operative site and sent off to pathology with all other bone fragments and the forefoot and labeled as right forefoot.  Any tendonious debris was resected as far proximally as possible.  The surgical site was then flushed with copious amounts normal sterile saline.  Any bleeding vessels were then cauterized with electrocauterization.  The deep fascia was then reapproximated well coapted with 3-0 Vicryl as well as subcutaneous tissue.  While doing this the skin flap was then rotated to cover the void of the previous ulceration.  The previous ulceration measured approximately 1 cm x 1 cm x 0.5 cm.  The flap was rotated approximately 2 cm to cover the void and was held into place with combination of 3-0 Vicryl and 3-0 nylon.  This appeared to approximate the edges well.  The skin was  then reapproximated well coapted with 3-0 nylon in a combination of horizontal mattress stitching and simple stitching.  This was then reinforced with skin staples.  The pneumatic ankle tourniquet was deflated and a prompt hyperemic response was noted to the  remainder of the right foot.  Xeroform was applied to the incisional area followed by 4 x 4 gauze, ABD, Kerlix, Ace wrap.  The patient tolerated the procedure and anesthesia well and was transferred to recovery in vital signs stable vascular status intact to the remainder of the right foot.  Patient is to remain nonweightbearing to the right lower extremity and only use the heel contact for transfers in a postop shoe.  Physical therapy has been ordered.  Will see patient once again tomorrow and potentially plan for discharge within the next 1 to 2 days.  COMPLICATIONS: None  CONDITION: Good, stable  Rosetta Posner, [name]

## 2023-02-17 NOTE — TOC Progression Note (Signed)
Transition of Care [facility]) - Progression Note    Patient Details  Name: SALAR KOLBA MRN: [id number] Date of Birth: 1945/12/02  Transition of Care [facility]) CM/SW Contact  Chapman Fitch, RN Phone Number: 02/17/2023, 3:20 PM  Clinical Narrative:        Patient off the floor for TMA.  Patient with high risk for readmission score.  TOC to complete assessment at a later time    Expected Discharge Plan and Services                                               Social Determinants of Health (SDOH) Interventions SDOH Screenings   Food Insecurity: No Food Insecurity (01/16/2023)  Housing: Low Risk  (01/16/2023)  Transportation Needs: No Transportation Needs (01/16/2023)  Utilities: Not At Risk (01/16/2023)  Tobacco Use: Low Risk  (02/17/2023)  Recent Concern: Tobacco Use - Medium Risk (01/28/2023)   Received from [facility]    Readmission Risk Interventions    02/15/2023    4:10 PM  Readmission Risk Prevention Plan  Transportation Screening Complete  PCP or Specialist Appt within 3-5 Days Complete  HRI or Home Care Consult Complete  Social Work Consult for Recovery Care Planning/Counseling Complete  Palliative Care Screening Not Applicable  Medication Review Oceanographer) Complete

## 2023-02-17 NOTE — Plan of Care (Signed)
  Problem: Education: Goal: Ability to describe self-care measures that may prevent or decrease complications (Diabetes Survival Skills Education) will improve Outcome: Progressing Goal: Individualized Educational Video(s) Outcome: Progressing   Problem: Education: Goal: Individualized Educational Video(s) Outcome: Progressing   Problem: Coping: Goal: Ability to adjust to condition or change in health will improve Outcome: Progressing   Problem: Education: Goal: Knowledge of General Education information will improve Description: Including pain rating scale, medication(s)/side effects and non-pharmacologic comfort measures Outcome: Progressing

## 2023-02-17 NOTE — Consult Note (Signed)
WOC Nurse Consult Note: Reason for Consult: Right inguinal wound after multiple vascular interventions, nonhealing.  Left inguinal wound healed without event.   RIght foot vascular/neuropathic ulcer nonhealing with increased pain and purulence.  TO undergo transmetatarsal amputation this AM.  Podiatry team will manage this postoperatively.  If WOC team needed, please reconsult.  Wiil implement topical orders for right inguinal wound.  Wound type: surgical, infectious Pressure Injury POA: NA Measurement: Right inguinal fold with surgical wound that is nonintact in center  0.3 cm Wound bed: not able to visualize   Drainage (amount, consistency, odor) minimal creamy purulence noted  no odor Periwound: scarring from healed segments of incision line.  No erythema Dressing procedure/placement/frequency: Cleanse inguinal wound with NS and pat dry. Apply iodoform packing strip to drainage wound.  Cover with 2x2 and tape.  Change daily.  Will not follow at this time.  Please re-consult if needed.  Mike Gip MSN, RN, FNP-BC CWON Wound, Ostomy, Continence Nurse Outpatient [facility] [phone] Pager [phone]

## 2023-02-17 NOTE — Assessment & Plan Note (Signed)
Complicated history PAD with multiple interventions most recently in July, At which time he underwent bilateral femoral artery endarterectomies, embolectomy of the right SFA, and multiple stent placement.  Complicated by poor wound healing requiring wound VAC, that was removed recently as outpatient Vascular surgery reevaluated him and there was a concern of right groin infection so cultures were taken, patient is already on broad-spectrum antibiotics - Continue DAPT and statin

## 2023-02-17 NOTE — Transfer of Care (Signed)
Immediate Anesthesia Transfer of Care Note  Patient: Manuel [name]  Procedure(s) Performed: TRANSMETATARSAL AMPUTATION (Right: Toe)  Patient Location: PACU  Anesthesia Type:General  Level of Consciousness: awake  Airway & Oxygen Therapy: Patient Spontanous Breathing and Patient connected to face mask oxygen  Post-op Assessment: Report given to RN and Post -op Vital signs reviewed and stable  Post vital signs: Reviewed and stable  Last Vitals:  Vitals Value Taken Time  BP    Temp    Pulse    Resp    SpO2      Last Pain:  Vitals:   02/17/23 1517  TempSrc: Temporal  PainSc: 4          Complications: No notable events documented.

## 2023-02-18 ENCOUNTER — Encounter: Payer: Self-pay | Admitting: Podiatry

## 2023-02-18 DIAGNOSIS — E1169 Type 2 diabetes mellitus with other specified complication: Secondary | ICD-10-CM | POA: Diagnosis not present

## 2023-02-18 DIAGNOSIS — R531 Weakness: Secondary | ICD-10-CM | POA: Diagnosis not present

## 2023-02-18 DIAGNOSIS — E11621 Type 2 diabetes mellitus with foot ulcer: Secondary | ICD-10-CM | POA: Diagnosis not present

## 2023-02-18 DIAGNOSIS — M86171 Other acute osteomyelitis, right ankle and foot: Secondary | ICD-10-CM | POA: Diagnosis not present

## 2023-02-18 DIAGNOSIS — I10 Essential (primary) hypertension: Secondary | ICD-10-CM | POA: Diagnosis not present

## 2023-02-18 DIAGNOSIS — I739 Peripheral vascular disease, unspecified: Secondary | ICD-10-CM | POA: Diagnosis not present

## 2023-02-18 LAB — GLUCOSE, CAPILLARY
Glucose-Capillary: 411 mg/dL — ABNORMAL HIGH (ref 70–99)
Glucose-Capillary: 322 mg/dL — ABNORMAL HIGH (ref 70–99)
Glucose-Capillary: 286 mg/dL — ABNORMAL HIGH (ref 70–99)
Glucose-Capillary: 136 mg/dL — ABNORMAL HIGH (ref 70–99)
Glucose-Capillary: 181 mg/dL — ABNORMAL HIGH (ref 70–99)

## 2023-02-18 LAB — BASIC METABOLIC PANEL
Anion gap: 7 (ref 5–15)
BUN: 39 mg/dL — ABNORMAL HIGH (ref 8–23)
CO2: 23 mmol/L (ref 22–32)
Calcium: 9 mg/dL (ref 8.9–10.3)
Chloride: 98 mmol/L (ref 98–111)
Creatinine, Ser: 1 mg/dL (ref 0.61–1.24)
GFR, Estimated: >60 mL/min (ref >60)
Glucose, Bld: 410 mg/dL — ABNORMAL HIGH (ref 70–99)
Potassium: 4.4 mmol/L (ref 3.5–5.1)
Sodium: 128 mmol/L — ABNORMAL LOW (ref 135–145)

## 2023-02-18 LAB — CBC
HCT: 38.5 % — ABNORMAL LOW (ref 39.0–52.0)
Hemoglobin: 12.8 g/dL — ABNORMAL LOW (ref 13.0–17.0)
MCH: 27.2 pg (ref 26.0–34.0)
MCHC: 33.2 g/dL (ref 30.0–36.0)
MCV: 81.7 fL (ref 80.0–100.0)
Platelets: 283 10*3/uL (ref 150–400)
RBC: 4.71 MIL/uL (ref 4.22–5.81)
RDW: 14 % (ref 11.5–15.5)
WBC: 14.5 10*3/uL — ABNORMAL HIGH (ref 4.0–10.5)
nRBC: 0 % (ref 0.0–0.2)

## 2023-02-18 LAB — VITAMIN B1: Vitamin B1 (Thiamine): 124.4 nmol/L (ref 66.5–200.0)

## 2023-02-18 LAB — AEROBIC/ANAEROBIC CULTURE W GRAM STAIN (SURGICAL/DEEP WOUND)

## 2023-02-18 MED ORDER — INSULIN ASPART 100 UNIT/ML IJ SOLN
6.0000 [IU] | Freq: Three times a day (TID) | INTRAMUSCULAR | Status: DC
  Administered 2023-02-18 – 2023-02-19 (×4): 6 [IU] via SUBCUTANEOUS
  Filled 2023-02-18 (×4): qty 1

## 2023-02-18 MED ORDER — INSULIN GLARGINE-YFGN 100 UNIT/ML ~~LOC~~ SOLN
10.0000 [IU] | Freq: Two times a day (BID) | SUBCUTANEOUS | Status: DC
  Administered 2023-02-18 – 2023-02-19 (×3): 10 [IU] via SUBCUTANEOUS
  Filled 2023-02-18 (×4): qty 0.1

## 2023-02-18 NOTE — TOC Initial Note (Signed)
Transition of Care [facility]) - Initial/Assessment Note    Patient Details  Name: Manuel [name] MRN: [id number] Date of Birth: [birth year]  [facility]) CM/SW Contact:    Margarito Liner, LCSW Phone Number: 02/18/2023, 12:00 PM  Clinical Narrative:   Readmission prevention screen started. CSW met with patient. No supports at bedside. CSW introduced role and explained that discharge planning would be discussed. PCP is Marcelino Duster, MD at [facility]. Patient is active with [facility] for nursing. They are able to add PT. Patient has RW, SPC, BSC, wheelchair, and tub bench. He also reports having a wheelchair ramp. Patient voiced feelings of frustration with his current medical status and not wanting to be a burden on his family. PT and vascular NP came into the room so CSW will come back later.  Expected Discharge Plan: Home w Home Health Services Barriers to Discharge: Continued Medical Work up   Patient Goals and CMS Choice     Choice offered to / list presented to : Patient      Expected Discharge Plan and Services     Post Acute Care Choice: Resumption of Svcs/PTA Provider Living arrangements for the past 2 months: Single Family Home                           HH Arranged: RN, PT [facility] Agency: [facility] Date HH Agency Contacted: 02/18/23   Representative spoke with at [facility] Agency: Duwaine Maxin  Prior Living Arrangements/Services Living arrangements for the past 2 months: Single Family Home Lives with:: Spouse Patient language and need for interpreter reviewed:: Yes Do you feel safe going back to the place where you live?: Yes      Need for Family Participation in Patient Care: Yes (Comment) Care giver support system in place?: Yes (comment) Current home services: DME, Home RN Criminal Activity/Legal Involvement Pertinent to Current Situation/Hospitalization: No - Comment as needed  Activities of Daily Living   ADL  Screening (condition at time of admission) Independently performs ADLs?: Yes (appropriate for developmental age) Is the patient deaf or have difficulty hearing?: No Does the patient have difficulty seeing, even when wearing glasses/contacts?: No Does the patient have difficulty concentrating, remembering, or making decisions?: No  Permission Sought/Granted Permission sought to share information with : Facility Industrial/product designer granted to share information with : Yes, Verbal Permission Granted     Permission granted to share info w AGENCY: [facility]        Emotional Assessment Appearance:: Appears stated age Attitude/Demeanor/Rapport: Engaged, Gracious Affect (typically observed): Accepting, Appropriate, Calm, Pleasant Orientation: : Oriented to Self, Oriented to Place, Oriented to  Time, Oriented to Situation Alcohol / Substance Use: Not Applicable Psych Involvement: No (comment)  Admission diagnosis:  Diabetic foot ulcer (HCC) [H06.237, L97.509] Patient Active Problem List   Diagnosis Date Noted   Diabetic foot ulcer (HCC) 02/14/2023   Generalized weakness 02/14/2023   NSTEMI (non-ST elevated myocardial infarction) (HCC) 01/15/2023   Cellulitis of foot 11/26/2022   Toe necrosis (HCC) 11/26/2022   Adjustment disorder with depressed mood 11/14/2022   Chronic combined systolic and diastolic CHF (congestive heart failure) (HCC) 11/12/2022   Ischemic ulcer of lower extremity (HCC) 11/10/2022   Limb ischemia 11/10/2022   Left flank pain 06/16/2021   Constipation 06/16/2021   Foot ulcer, right (HCC) 06/14/2021   Lower limb ischemia right leg with history of angioplasty and stent on 05/14/2021 06/13/2021  Alcohol abuse 06/07/2021   Coronary artery disease with history of myocardial infarction without history of CABG 05/08/2021   S/P angioplasty with stent 05/08/2021   PAD (peripheral artery disease) (HCC) 05/08/2021   Diabetic polyneuropathy associated  with type 2 diabetes mellitus (HCC) 09/25/2017   Lumbar radiculopathy 01/27/2017   Lumbar degenerative disc disease 01/27/2017   Lumbar spondylosis 01/27/2017   Lumbar facet arthropathy 01/27/2017   Diabetes mellitus type 2, insulin dependent (HCC) 10/18/2014   Hypertension 10/05/2014   Bilateral carotid artery stenosis 04/05/2014   Microalbuminuria 12/19/2013   Gout 09/21/2013   Mixed hyperlipidemia 09/14/2013   PCP:  Gracelyn Nurse, MD Pharmacy:   [id number] [place] Rough, [place] - [address] AT [facility] OF [address] [place] [address] Phone: [phone] Fax: [phone]     Social Determinants of Health (SDOH) Social History: SDOH Screenings   Food Insecurity: No Food Insecurity (02/17/2023)  Housing: Patient Declined (02/17/2023)  Transportation Needs: No Transportation Needs (02/17/2023)  Utilities: Not At Risk (02/17/2023)  Tobacco Use: Low Risk  (02/17/2023)  Recent Concern: Tobacco Use - Medium Risk (01/28/2023)   Received from [facility]   SDOH Interventions:     Readmission Risk Interventions    02/18/2023   11:58 AM 02/15/2023    4:10 PM  Readmission Risk Prevention Plan  Transportation Screening  Complete  PCP or Specialist Appt within 3-5 Days Complete Complete  HRI or Home Care Consult Complete Complete  Social Work Consult for Recovery Care Planning/Counseling Complete Complete  Palliative Care Screening Not Applicable Not Applicable  Medication Review Oceanographer) Complete Complete

## 2023-02-18 NOTE — Inpatient Diabetes Management (Addendum)
Inpatient Diabetes Program Recommendations  AACE/ADA: New Consensus Statement on Inpatient Glycemic Control (2015)  Target Ranges:  Prepandial:   less than 140 mg/dL      Peak postprandial:   less than 180 mg/dL (1-2 hours)      Critically ill patients:  140 - 180 mg/dL    Latest Reference Range & Units 02/17/23 08:11 02/17/23 11:26 02/17/23 15:26 02/17/23 17:22 02/17/23 21:07 02/17/23 22:15 02/17/23 22:55  Glucose-Capillary 70 - 99 mg/dL 213 (H)  5 units Novolog  5 units Semglee  192 (H)  3 units Novolog  169 (H)    5 mg Decadron @1602  184 (H) 468 (H) 519 (HH)  10 units Novolog  486 (H)  (HH): Data is critically high (H): Data is abnormally high  Latest Reference Range & Units 02/17/23 23:30 02/18/23 04:24  Glucose-Capillary 70 - 99 mg/dL 086 (H)  5 units Semglee @0107  411 (H)  (H): Data is abnormally high    Home DM Meds: 70/30 Insulin 30 units BID       Farxiga 10 mg daily  Current Orders: Semglee 5 units BID      Novolog Moderate Correction Scale/ SSI (0-15 units) TID AC      Novolog 3 units TID with meals    MD- Note pt received Decadron 5 mg X 1 dose yest afternoon for Surgery CBGs severely elevated last PM and this AM  Takes larger doses of insulin at home  Please consider:  Increase Semglee to 12 units BID (0.3 units/kg) split dose (Gets about 42 units basal insulin in his 2 doses of 70/30 insulin per day)    --Will follow patient during hospitalization--  Ambrose Finland RN, MSN, CDCES Diabetes Coordinator Inpatient Glycemic Control Team Team Pager: [phone] (8a-5p)

## 2023-02-18 NOTE — Consult Note (Signed)
NAME: Manuel [name]  DOB: 08-Nov-1945  MRN: [id number]  Date/Time: 02/18/2023 4:29 PM  REQUESTING PROVIDER: Dr.Amin Subjective:  REASON FOR CONSULT: rt foot infection ? Manuel [name] is a 77 y.o. with a history of DM, , HTN, HLD, ischemic cardiomyopathy, OSA , PAD Pt had right  foot infection and underwent 4/5th toe amputation on 11/18/22 July 2024 HE  had angio and that revealed extensive atherosclerotic disease and he had endarterectomies of both rt and left common, profunda and superficial femoral artery, fogary embolectomy of rt SFA, stent placement in b/l common iliac arteries, left external iliac and Rt PTA, on 11/15/22  Had wound dehiscence on the rt groin  , he underwent debridement of the wound on the rt groin on 01/09/23 with excision of 15 cm2 of skin, soft tissue, muscle fascia and application of neg pressure vac Returned to ED on 9/25 with leg pain Rt > left and worsening SOB. Treated for NSTEMI. Saw Dr.Dew on 10/25 and he thought e wound had healed well and removed wound vac, he went to the ED the same day with weakness and weight loss of 20 pounds and drainage from the rt foot ulcer Vitals  02/14/23 09:41  BP 140/80 !  Pulse Rate 80  Resp 16  Temp 97.9    Latest Reference Range & Units 02/14/23 11:57  WBC 4.0 - 10.5 K/uL 10.8 (H)  Hemoglobin 13.0 - 17.0 g/dL 57.8  HCT 46.9 - 62.9 % 43.3  Platelets 150 - 400 K/uL 303  Creatinine 0.61 - 1.24 mg/dL 5.28  Patient had blood culture sent.  Started on broad-spectrum antibiotics.  MRI of the foot revealed marrow edema in the base of the proximal phalanx of the third toe and also on the plantar portion of the head of the third metatarsal.  He was seen by podiatrist.  He underwent TMA on 02/17/2023. Mast to see the patient for management of the infection. He also had a culture of the right groin area and that has Serratia, Staph aureus and Enterococcus. ID  Past Medical History:  Diagnosis Date   Acute ST elevation myocardial  infarction (STEMI) of inferior wall (HCC) 04/19/2010   a.) trans[facility] --> LHC/PCI (very difficult procedure) --> 3.0 x 23 mm and 3.0 x 12 mm Xience stents to RCA   Allergies    Arthritis    Benign essential hypertension    Bilateral carotid artery disease (HCC) 05/08/2021   a.) carotid doppler 05/08/2021: 1-39% BICA   CAD (coronary artery disease) 04/19/2010   a.) inferior STEMI 04/19/2010 --> LHC/PCI: 50-70% pD1, 80% pRI, 90/90/90% RCA (overlapping 3.0 x 23 and 3.0 x 12 mm Xience DES); b.) MV 11/10/2018: fixed minimally reversible inferior perfusion defect   Cellulitis of foot    DDD (degenerative disc disease), cervical    Diabetes mellitus type 2, insulin dependent (HCC)    Diverticulosis    Full dentures    Gout    Hard of hearing    History of bilateral cataract extraction 2022   History of ETOH abuse    Hyperlipidemia    Ischemic cardiomyopathy 04/19/2010   a.) TTE 04/19/2010: 40%; b.) TTE 04/20/2014: EF >55%, mild RVE, triv PR, mild MR/TR, G1DD; c.) TTE 11/10/2018: EF 45%, inf HK, mild RVE, triv TR/PR, mild MR, G1DD; d.) TTE 11/14/2022: EF 25-30%, basal anteroseptal, apical lateral, apical septal, and apex AK, mid anterolateral HK, mild-mod MR/TR   Long term current use of aspirin    Long  term current use of clopidogrel    Lumbar degenerative disc disease    Lumbar radiculopathy    Lumbar vertebral fracture (chronic superior endplate of L1)    OSA (obstructive sleep apnea)    a.) unable to tolerate nocturnal PAP therapy   Peripheral artery disease (HCC)    a.) stenting 05/14/21: 12 mm x 12 cm LifeStent RIGHT dis SFA/prox pop; b.) s/p cath directed thrombolysis RIGHT SFA/pop 06/14/21; c.) s/p mech thrombectomy + stenting 06/15/21: 8 mm x 25cm & 8 mm x 7.5cm Viabahn; d.) s/p BILAT CFA, profunda femoris, SFA endarterectomies + fogarty embolectomy + stenting 11/15/22: 12mm x 58mm Lifestream BILAT CIAs, 14 mm x 6 cm Lifestream & 13 mm x 5 cm Viabahn LEFT EIA   Peripheral  neuropathy    Umbilical hernia     Past Surgical History:  Procedure Laterality Date   AMPUTATION Right 11/18/2022   Procedure: AMPUTATION 4TH AND 5TH RAY;  Surgeon: Linus Galas, [name];  Location: [facility];  Service: Orthopedics/Podiatry;  Laterality: Right;  4th and 5th toe   APPLICATION OF WOUND VAC Right 01/09/2023   Procedure: APPLICATION OF WOUND VAC;  Surgeon: Annice Needy, MD;  Location: [facility];  Service: Vascular;  Laterality: Right;   CATARACT EXTRACTION W/PHACO Right 03/14/2021   Procedure: CATARACT EXTRACTION PHACO AND INTRAOCULAR LENS PLACEMENT (IOC) RIGHT DIABETIC;  Surgeon: Lockie Mola, MD;  Location: [facility];  Service: Ophthalmology;  Laterality: Right;  Diabetic 16.78 01:39.9   CATARACT EXTRACTION W/PHACO Left 03/28/2021   Procedure: CATARACT EXTRACTION PHACO AND INTRAOCULAR LENS PLACEMENT (IOC) LEFT DIABETIC 6.93 01:22.0;  Surgeon: Lockie Mola, MD;  Location: [facility];  Service: Ophthalmology;  Laterality: Left;  Diabetic   COLONOSCOPY     CORONARY ANGIOPLASTY WITH STENT PLACEMENT  03/2010   Procedure: CORONARY ANGIOPLASTY WITH STENT PLACEMENT; Location: [name]   ENDARTERECTOMY FEMORAL Bilateral 11/15/2022   Procedure: BILATERAL COMMON FEMORAL PROFUNDA FEMORIS AND SUPERFICIAL FEMORAL ARTERY ENDARTECTOMIES, RIGHT FOGARTY EMBOLECTOMY OF THE RIGHT SFA  AND  POPLITEAL ARTERIES. AORTAGRAM AND RIGHT LOWER EXTREMITY ANGIOGRAM.;  Surgeon: Annice Needy, MD;  Location: [facility];  Service: Vascular;  Laterality: Bilateral;   INSERTION OF ILIAC STENT Bilateral 11/15/2022   Procedure: BILATERAL STENT INSERTION IN BILATERAL  COMMON ILIAC ARTERY, STENT INSERTION OF LEFT EXTERNAL ILIAC ARTERY. ANGIOPLASTY RIGHT TIBIAL  AND POPLITEAL ARTERY.;  Surgeon: Annice Needy, MD;  Location: [facility];  Service: Vascular;  Laterality: Bilateral;   LOWER EXTREMITY ANGIOGRAPHY Right 05/14/2021   Procedure: LOWER EXTREMITY ANGIOGRAPHY;  Surgeon: Annice Needy, MD;   Location: [facility];  Service: Cardiovascular;  Laterality: Right;   LOWER EXTREMITY ANGIOGRAPHY Right 06/14/2021   Procedure: Lower Extremity Angiography;  Surgeon: Annice Needy, MD;  Location: [facility];  Service: Cardiovascular;  Laterality: Right;   LOWER EXTREMITY ANGIOGRAPHY Right 11/11/2022   Procedure: Lower Extremity Angiography;  Surgeon: Annice Needy, MD;  Location: [facility];  Service: Cardiovascular;  Laterality: Right;   LOWER EXTREMITY INTERVENTION Right 06/15/2021   Procedure: LOWER EXTREMITY INTERVENTION;  Surgeon: Annice Needy, MD;  Location: [facility];  Service: Cardiovascular;  Laterality: Right;   TONSILLECTOMY     TRANSMETATARSAL AMPUTATION Right 02/17/2023   Procedure: TRANSMETATARSAL AMPUTATION;  Surgeon: Rosetta Posner, [name];  Location: [facility];  Service: Orthopedics/Podiatry;  Laterality: Right;   WOUND DEBRIDEMENT Right 01/09/2023   Procedure: DEBRIDEMENT WOUND;  Surgeon: Annice Needy, MD;  Location: [facility];  Service: Vascular;  Laterality: Right;    Social History   Socioeconomic History   Marital status: Married    Spouse name: [name]   Number of children: 3   Years of education: Not on file   Highest education level: Not on file  Occupational History   Not on file  Tobacco Use   Smoking status: Never   Smokeless tobacco: Never  Vaping Use   Vaping status: Never Used  Substance and Sexual Activity   Alcohol use: Yes    Alcohol/week: 21.0 standard drinks of alcohol    Types: 7 Glasses of wine, 14 Cans of beer per week    Comment: occassional   Drug use: No   Sexual activity: Not on file  Other Topics Concern   Not on file  Social History Narrative   Not on file   Social Determinants of Health   Financial Resource Strain: Not on file  Food Insecurity: No Food Insecurity (02/17/2023)   Hunger Vital Sign    Worried About Running Out of Food in the Last Year: Never true    Ran Out of Food in the Last  Year: Never true  Transportation Needs: No Transportation Needs (02/17/2023)   PRAPARE - Administrator, Civil Service (Medical): No    Lack of Transportation (Non-Medical): No  Physical Activity: Not on file  Stress: Not on file  Social Connections: Not on file  Intimate Partner Violence: Not At Risk (02/17/2023)   Humiliation, Afraid, Rape, and Kick questionnaire    Fear of Current or Ex-Partner: No    Emotionally Abused: No    Physically Abused: No    Sexually Abused: No    Family History  Problem Relation Age of Onset   Scoliosis Mother    Heart disease Father    No Known Allergies I? Current Facility-Administered Medications  Medication Dose Route Frequency Provider Last Rate Last Admin   acetaminophen (TYLENOL) tablet 650 mg  650 mg Oral Q6H PRN Rosetta Posner, [name]   650 mg at 02/15/23 2204   Or   acetaminophen (TYLENOL) suppository 650 mg  650 mg Rectal Q6H PRN Rosetta Posner, [name]       ascorbic acid (VITAMIN C) tablet 250 mg  250 mg Oral BID Rosetta Posner, [name]   250 mg at 02/18/23 1610   aspirin EC tablet 81 mg  81 mg Oral Daily Rosetta Posner, [name]   81 mg at 02/18/23 0929   atorvastatin (LIPITOR) tablet 80 mg  80 mg Oral QHS Rosetta Posner, [name]   80 mg at 02/17/23 2033   carvedilol (COREG) tablet 25 mg  25 mg Oral BID WC Rosetta Posner, [name]   25 mg at 02/18/23 0929   ceFEPIme (MAXIPIME) 2 g in sodium chloride 0.9 % 100 mL IVPB  2 g Intravenous Q8H Rosetta Posner, [name] 200 mL/hr at 02/18/23 1103 2 g at 02/18/23 1103   Chlorhexidine Gluconate Cloth 2 % PADS 6 each  6 each Topical Q0600 Rosetta Posner, [name]   6 each at 02/17/23 9604   clopidogrel (PLAVIX) tablet 75 mg  75 mg Oral Daily Rosetta Posner, [name]   75 mg at 02/18/23 5409   folic acid (FOLVITE) tablet 1 mg  1 mg Oral Daily Rosetta Posner, [name]   1 mg at 02/18/23 8119   gabapentin (NEURONTIN) capsule 800 mg  800 mg Oral BID Rosetta Posner, [name]   800 mg at 02/18/23 0929   hydrALAZINE (APRESOLINE) injection 5 mg  5 mg  Intravenous Q8H  PRN Rosetta Posner, [name]       HYDROmorphone (DILAUDID) injection 0.5-1 mg  0.5-1 mg Intravenous Q3H PRN Rosetta Posner, [name]   1 mg at 02/17/23 0451   insulin aspart (novoLOG) injection 0-15 Units  0-15 Units Subcutaneous TID WC Rosetta Posner, [name]   8 Units at 02/18/23 1232   insulin aspart (novoLOG) injection 6 Units  6 Units Subcutaneous TID WC Arnetha Courser, MD   6 Units at 02/18/23 1232   insulin glargine-yfgn (SEMGLEE) injection 10 Units  10 Units Subcutaneous BID Arnetha Courser, MD   10 Units at 02/18/23 1104   iron polysaccharides (NIFEREX) capsule 150 mg  150 mg Oral Daily Rosetta Posner, [name]   150 mg at 02/18/23 0930   metroNIDAZOLE (FLAGYL) IVPB 500 mg  500 mg Intravenous Q12H Rosetta Posner, [name]   Stopped at 02/18/23 0700   mirtazapine (REMERON) tablet 15 mg  15 mg Oral QHS Rosetta Posner, [name]   15 mg at 02/17/23 2033   multivitamin with minerals tablet 1 tablet  1 tablet Oral Daily Rosetta Posner, [name]   1 tablet at 02/18/23 1610   mupirocin ointment (BACTROBAN) 2 % 1 Application  1 Application Nasal BID Rosetta Posner, [name]   1 Application at 02/18/23 0930   ondansetron (ZOFRAN) tablet 4 mg  4 mg Oral Q6H PRN Rosetta Posner, [name]       Or   ondansetron (ZOFRAN) injection 4 mg  4 mg Intravenous Q6H PRN Rosetta Posner, [name]   4 mg at 02/17/23 1120   oxyCODONE (Oxy IR/ROXICODONE) immediate release tablet 5 mg  5 mg Oral Q4H PRN Rosetta Posner, [name]   5 mg at 02/15/23 2203   polyethylene glycol (MIRALAX / GLYCOLAX) packet 17 g  17 g Oral Daily PRN Rosetta Posner, [name]       protein supplement (ENSURE MAX) liquid  11 oz Oral BID Rosetta Posner, [name]   11 oz at 02/18/23 0931   sacubitril-valsartan (ENTRESTO) 24-26 mg per tablet  1 tablet Oral Q12H Rosetta Posner, [name]   1 tablet at 02/18/23 9604   senna-docusate (Senokot-S) tablet 1 tablet  1 tablet Oral QHS Rosetta Posner, [name]   1 tablet at 02/17/23 2032   sodium chloride flush (NS) 0.9 % injection 3 mL  3 mL Intravenous Q12H Rosetta Posner, [name]   3  mL at 02/18/23 5409   spironolactone (ALDACTONE) tablet 50 mg  50 mg Oral Daily Rosetta Posner, [name]   50 mg at 02/18/23 8119   tamsulosin (FLOMAX) capsule 0.4 mg  0.4 mg Oral QPC supper Rosetta Posner, [name]   0.4 mg at 02/16/23 1738   thiamine (VITAMIN B1) tablet 100 mg  100 mg Oral Daily Rosetta Posner, [name]   100 mg at 02/18/23 1478   Or   thiamine (VITAMIN B1) injection 100 mg  100 mg Intravenous Daily Rosetta Posner, [name]       torsemide [facility]) tablet 20 mg  20 mg Oral Daily Rosetta Posner, [name]   20 mg at 02/18/23 0929   vancomycin (VANCOREADY) IVPB 750 mg/150 mL  750 mg Intravenous Q12H Rosetta Posner, [name] 150 mL/hr at 02/18/23 0935 750 mg at 02/18/23 0935     Abtx:  Anti-infectives (From admission, onward)    Start     Dose/Rate Route Frequency Ordered Stop   02/17/23 0600  ceFAZolin (ANCEF) IVPB 2g/100 mL premix        2 g 200 mL/hr over 30 Minutes Intravenous On call to O.R. 02/16/23 2238 02/17/23 1606  02/15/23 0900  vancomycin (VANCOREADY) IVPB 750 mg/150 mL        750 mg 150 mL/hr over 60 Minutes Intravenous Every 12 hours 02/14/23 2150     02/15/23 0600  metroNIDAZOLE (FLAGYL) IVPB 500 mg        500 mg 100 mL/hr over 60 Minutes Intravenous Every 12 hours 02/14/23 2027     02/15/23 0300  ceFEPIme (MAXIPIME) 2 g in sodium chloride 0.9 % 100 mL IVPB        2 g 200 mL/hr over 30 Minutes Intravenous Every 8 hours 02/14/23 2150     02/14/23 1845  vancomycin (VANCOREADY) IVPB 1750 mg/350 mL        1,750 mg 175 mL/hr over 120 Minutes Intravenous  Once 02/14/23 1830 02/14/23 2317   02/14/23 1845  ceFEPIme (MAXIPIME) 2 g in sodium chloride 0.9 % 100 mL IVPB        2 g 200 mL/hr over 30 Minutes Intravenous  Once 02/14/23 1830 02/14/23 2002   02/14/23 1800  metroNIDAZOLE (FLAGYL) IVPB 500 mg        500 mg 100 mL/hr over 60 Minutes Intravenous  Once 02/14/23 1759 02/14/23 2002       REVIEW OF SYSTEMS:  Const: negative fever, negative chills, weight of 20 pounds Eyes: negative  diplopia or visual changes, negative eye pain ENT: negative coryza, negative sore throat Resp: negative cough, hemoptysis, dyspnea Cards: negative for chest pain, palpitations, lower extremity edema GU: negative for frequency, dysuria and hematuria GI: Negative for abdominal pain, diarrhea, bleeding, constipation Skin: negative for rash and pruritus Heme: negative for easy bruising and gum/nose bleeding MS: Weakness, fatigue Neurolo:negative for headaches, dizziness, vertigo, memory problems  Psych: negative for feelings of anxiety, depression  Endocrine:, diabetes Allergy/Immunology- negative for any medication or food allergies ?  Objective:  VITALS:  BP (!) 153/81   Pulse 80   Temp (!) 97.5 F (36.4 C) (Oral)   Resp 18   Ht 5' 7.99" (1.727 m)   Wt 82.6 kg   SpO2 100%   BMI 27.69 kg/m  LDA Foley Central line Other drainage tubes PHYSICAL EXAM:  General: Alert, cooperative, no distress, appears stated age.  Head: Normocephalic, without obvious abnormality, atraumatic. Eyes: Conjunctivae clear, anicteric sclerae. Pupils are equal ENT Nares normal. No drainage or sinus tenderness. Lips, mucosa, and tongue normal. No Thrush Neck: Supple, symmetrical, no adenopathy, thyroid: non tender no carotid bruit and no JVD. Back: No CVA tenderness. Lungs: Clear to auscultation bilaterally. No Wheezing or Rhonchi. No rales. Heart: Regular rate and rhythm, no murmur, rub or gallop. Abdomen: Soft, non-tender,not distended. Bowel sounds normal. No masses Extremities:  Picture of the foot before surgery reviewed     Right groin area the wound is almost healed There is a small area where there is some mucus-like discharge  Skin: No rashes or lesions. Or bruising Lymph: Cervical, supraclavicular normal. Neurologic: Grossly non-focal Pertinent Labs Lab Results CBC    Component Value Date/Time   WBC 14.5 (H) 02/18/2023 0507   RBC 4.71 02/18/2023 0507   HGB 12.8 (L) 02/18/2023  0507   HGB 15.7 02/16/2012 2013   HCT 38.5 (L) 02/18/2023 0507   HCT 44.4 02/16/2012 2013   PLT 283 02/18/2023 0507   PLT 176 02/16/2012 2013   MCV 81.7 02/18/2023 0507   MCV 91 02/16/2012 2013   MCH 27.2 02/18/2023 0507   MCHC 33.2 02/18/2023 0507   RDW 14.0 02/18/2023 0507   RDW 13.0 02/16/2012 2013  LYMPHSABS 1.1 01/15/2023 0754   MONOABS 1.0 01/15/2023 0754   EOSABS 0.1 01/15/2023 0754   BASOSABS 0.1 01/15/2023 0754       Latest Ref Rng & Units 02/18/2023    5:07 AM 02/17/2023    5:32 AM 02/15/2023    5:13 AM  CMP  Glucose 70 - 99 mg/dL 161  096  045   BUN 8 - 23 mg/dL 39  33  22   Creatinine 0.61 - 1.24 mg/dL 4.09  8.11  9.14   Sodium 135 - 145 mmol/L 128  133  138   Potassium 3.5 - 5.1 mmol/L 4.4  3.9  3.5   Chloride 98 - 111 mmol/L 98  102  104   CO2 22 - 32 mmol/L 23  22  26    Calcium 8.9 - 10.3 mg/dL 9.0  9.0  9.0       Microbiology: Recent Results (from the past 240 hour(s))  Resp panel by RT-PCR (RSV, Flu A&B, Covid) Anterior Nasal Swab     Status: None   Collection Time: 02/14/23 11:57 AM   Specimen: Anterior Nasal Swab  Result Value Ref Range Status   SARS Coronavirus 2 by RT PCR NEGATIVE NEGATIVE Final    Comment: (NOTE) SARS-CoV-2 target nucleic acids are NOT DETECTED.  The SARS-CoV-2 RNA is generally detectable in upper respiratory specimens during the acute phase of infection. The lowest concentration of SARS-CoV-2 viral copies this assay can detect is 138 copies/mL. A negative result does not preclude SARS-Cov-2 infection and should not be used as the sole basis for treatment or other patient management decisions. A negative result may occur with  improper specimen collection/handling, submission of specimen other than nasopharyngeal swab, presence of viral mutation(s) within the areas targeted by this assay, and inadequate number of viral copies(<138 copies/mL). A negative result must be combined with clinical observations, patient history,  and epidemiological information. The expected result is Negative.  Fact Sheet for Patients:  BloggerCourse.com  Fact Sheet for Healthcare Providers:  SeriousBroker.it  This test is no t yet approved or cleared by the [place] FDA and  has been authorized for detection and/or diagnosis of SARS-CoV-2 by FDA under an Emergency Use Authorization (EUA). This EUA will remain  in effect (meaning this test can be used) for the duration of the COVID-19 declaration under Section 564(b)(1) of the Act, 21 U.S.C.section 360bbb-3(b)(1), unless the authorization is terminated  or revoked sooner.       Influenza A by PCR NEGATIVE NEGATIVE Final   Influenza B by PCR NEGATIVE NEGATIVE Final    Comment: (NOTE) The Xpert Xpress SARS-CoV-2/FLU/RSV plus assay is intended as an aid in the diagnosis of influenza from Nasopharyngeal swab specimens and should not be used as a sole basis for treatment. Nasal washings and aspirates are unacceptable for Xpert Xpress SARS-CoV-2/FLU/RSV testing.  Fact Sheet for Patients: BloggerCourse.com  Fact Sheet for Healthcare Providers: SeriousBroker.it  This test is not yet approved or cleared by the [place] FDA and has been authorized for detection and/or diagnosis of SARS-CoV-2 by FDA under an Emergency Use Authorization (EUA). This EUA will remain in effect (meaning this test can be used) for the duration of the COVID-19 declaration under Section 564(b)(1) of the Act, 21 U.S.C. section 360bbb-3(b)(1), unless the authorization is terminated or revoked.     Resp Syncytial Virus by PCR NEGATIVE NEGATIVE Final    Comment: (NOTE) Fact Sheet for Patients: BloggerCourse.com  Fact Sheet for Healthcare Providers: SeriousBroker.it  This  test is not yet approved or cleared by the [place] and has  been authorized for detection and/or diagnosis of SARS-CoV-2 by FDA under an Emergency Use Authorization (EUA). This EUA will remain in effect (meaning this test can be used) for the duration of the COVID-19 declaration under Section 564(b)(1) of the Act, 21 U.S.C. section 360bbb-3(b)(1), unless the authorization is terminated or revoked.  Performed at [facility], [address] [place], [place] [address]   Blood culture (routine x 2)     Status: None (Preliminary result)   Collection Time: 02/14/23  5:20 PM   Specimen: BLOOD  Result Value Ref Range Status   Specimen Description BLOOD BLOOD RIGHT ARM  Final   Special Requests   Final    BOTTLES DRAWN AEROBIC AND ANAEROBIC Blood Culture adequate volume   Culture   Final    NO GROWTH 4 DAYS Performed at [facility], [address], [place], [place] [address]    Report Status PENDING  Incomplete  Blood culture (routine x 2)     Status: None (Preliminary result)   Collection Time: 02/14/23  5:23 PM   Specimen: BLOOD  Result Value Ref Range Status   Specimen Description BLOOD BLOOD LEFT ARM  Final   Special Requests   Final    BOTTLES DRAWN AEROBIC AND ANAEROBIC Blood Culture adequate volume   Culture   Final    NO GROWTH 4 DAYS Performed at [facility], [address], [place], [place] [address]    Report Status PENDING  Incomplete  Aerobic/Anaerobic Culture w Gram Stain (surgical/deep wound)     Status: None   Collection Time: 02/15/23  9:14 AM   Specimen: Wound  Result Value Ref Range Status   Specimen Description   Final    WOUND Performed at [facility], [address] [place], [place] [address]    Special Requests   Final    RF Performed at [facility], [address] [place], [place] [address]    Gram Stain   Final    RARE WBC PRESENT, PREDOMINANTLY MONONUCLEAR MODERATE GRAM POSITIVE RODS MODERATE GRAM NEGATIVE RODS FEW GRAM POSITIVE COCCI IN PAIRS AND  CHAINS    Culture   Final    MODERATE MORGANELLA MORGANII MODERATE CORYNEBACTERIUM AMYCOLATUM Standardized susceptibility testing for this organism is not available. ABUNDANT BACTEROIDES SPECIES NOT FRAGILIS BETA LACTAMASE POSITIVE Performed at [facility], [address] [place], [place] [address]    Report Status 02/18/2023 FINAL  Final   Organism ID, Bacteria MORGANELLA MORGANII  Final      Susceptibility   Morganella morganii - MIC*    AMPICILLIN >=32 RESISTANT Resistant     CEFTAZIDIME <=1 SENSITIVE Sensitive     CIPROFLOXACIN <=0.25 SENSITIVE Sensitive     GENTAMICIN <=1 SENSITIVE Sensitive     IMIPENEM 1 SENSITIVE Sensitive     TRIMETH/SULFA <=20 SENSITIVE Sensitive     AMPICILLIN/SULBACTAM >=32 RESISTANT Resistant     PIP/TAZO <=4 SENSITIVE Sensitive ug/mL    * MODERATE MORGANELLA MORGANII  Aerobic Culture w Gram Stain (superficial specimen)     Status: None (Preliminary result)   Collection Time: 02/15/23  9:42 AM   Specimen: Wound  Result Value Ref Range Status   Specimen Description   Final    WOUND Performed at [facility], [address] [place], [place] [address]    Special Requests   Final    GROIN RIGHT Performed at  [facility], [address], [place], [place] [address]    Gram Stain   Final    FEW WBC PRESENT,BOTH PMN AND MONONUCLEAR RARE GRAM POSITIVE COCCI IN PAIRS    Culture   Final    RARE ESCHERICHIA COLI RARE SERRATIA MARCESCENS RARE STAPHYLOCOCCUS AUREUS SUSCEPTIBILITIES TO FOLLOW Performed at [facility], [address], [place], [place] [address]    Report Status PENDING  Incomplete   Organism ID, Bacteria ESCHERICHIA COLI  Final   Organism ID, Bacteria SERRATIA MARCESCENS  Final      Susceptibility   Escherichia coli - MIC*    AMPICILLIN <=2 SENSITIVE Sensitive     CEFEPIME <=0.12 SENSITIVE Sensitive     CEFTAZIDIME <=1 SENSITIVE Sensitive     CEFTRIAXONE <=0.25 SENSITIVE Sensitive      CIPROFLOXACIN <=0.25 SENSITIVE Sensitive     GENTAMICIN <=1 SENSITIVE Sensitive     IMIPENEM <=0.25 SENSITIVE Sensitive     TRIMETH/SULFA <=20 SENSITIVE Sensitive     AMPICILLIN/SULBACTAM <=2 SENSITIVE Sensitive     PIP/TAZO <=4 SENSITIVE Sensitive ug/mL    * RARE ESCHERICHIA COLI   Serratia marcescens - MIC*    CEFEPIME <=0.12 SENSITIVE Sensitive     CEFTAZIDIME <=1 SENSITIVE Sensitive     CEFTRIAXONE 32 RESISTANT Resistant     CIPROFLOXACIN <=0.25 SENSITIVE Sensitive     GENTAMICIN <=1 SENSITIVE Sensitive     TRIMETH/SULFA <=20 SENSITIVE Sensitive     * RARE SERRATIA MARCESCENS  Surgical pcr screen     Status: Abnormal   Collection Time: 02/16/23 10:40 PM   Specimen: Nasal Mucosa; Nasal Swab  Result Value Ref Range Status   MRSA, PCR NEGATIVE NEGATIVE Final   Staphylococcus aureus POSITIVE (A) NEGATIVE Final    Comment: (NOTE) The Xpert SA Assay (FDA approved for NASAL specimens in patients 33 years of age and older), is one component of a comprehensive surveillance program. It is not intended to diagnose infection nor to guide or monitor treatment. Performed at [facility], [address] [place], [place] [address]     IMAGING RESULTS: MRI of the right foot  reviewed Marrow edema at the third met head and phalanx I have personally reviewed the films ? 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  Severe PAD status post bilateral femoral endarterectomies with bovine pericardial patch placement The surgical site on the right has a small area of mucousy discharge The culture that was taken from that  showed Serratia Staph aureus and Enterococcus Patient currently on vancomycin cefepime and Flagyl   ? ________________________________________________ Discussed with patient, and from only at bedside note:    This document was prepared using Dragon voice recognition software and may include unintentional dictation errors.

## 2023-02-18 NOTE — Assessment & Plan Note (Signed)
Patient notes gradually worsening fatigue and generalized weakness on exertion for the last 6 weeks, in addition to insomnia, poor appetite and weight loss.  There may be a component of underlying infection contributing to this, in addition to patient's comorbidities including HFrEF, however on further discussion, I suspect patient is depressed versus adjustment disorder. TSH and B12 normal, B1 pending - PT/OT recommending home health -Started on mirtazapine 15 mg nightly and amitriptyline was discontinued

## 2023-02-18 NOTE — Evaluation (Signed)
Physical Therapy Evaluation Patient Details Name: Manuel [name] MRN: [id number] DOB: 09-22-1945 Today's Date: 02/18/2023  History of Present Illness  Patient is a 77 year old male with worsening generalized weakness over the past 6 weeks, worsening pain and foul-smelling drainage from ulcer on R foot. 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  Clinical Impression  Patient was agreeable to PT evaluation. Supportive family members at the bedside. Patient reports using a cane recently for ambulation with generalized weakness for months at home.  Today, education provided on transfer and ambulation techniques while maintaining NWB of RLE. Bed mobility and transfers performed with Mod I. With gait training, patient is able to maintain  NWB of RLE using rolling walker with cues for gait kinematics with activity tolerance limited by fatigue. Discussed using wheelchair at home for longer distances in the home and for community mobility until weight bearing status is upgraded. No pain is reported with mobility. Patient is hopeful to return home soon with family support. PT will continue to follow to maximize independence and decrease caregiver burden.       If plan is discharge home, recommend the following: A little help with bathing/dressing/bathroom;Assistance with cooking/housework;Help with stairs or ramp for entrance;Supervision due to cognitive status   Can travel by private vehicle        Equipment Recommendations None recommended by PT  Recommendations for Other Services       Functional Status Assessment Patient has had a recent decline in their functional status and demonstrates the ability to make significant improvements in function in a reasonable and predictable amount of time.     Precautions / Restrictions Precautions Precautions: Fall Restrictions Weight Bearing Restrictions: Yes RLE Weight Bearing: Non weight bearing Other Position/Activity  Restrictions: right heel contact only for transfers with shoe      Mobility  Bed Mobility Overal bed mobility: Modified Independent                  Transfers Overall transfer level: Modified independent Equipment used: None               General transfer comment: verbal cues for RLE positioning to avoid weight bearing with transfers.    Ambulation/Gait Ambulation/Gait assistance: Contact guard assist, Supervision Gait Distance (Feet): 8 Feet Assistive device: Rolling walker (2 wheels)   Gait velocity: decreased     General Gait Details: small hops steps performed with cues for technique and importance of maintaining NWB of RLE. patient was able to maintain NWB without difficulty. activity tolerance is limited by fatigue. 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)       Balance Overall balance assessment: Needs assistance Sitting-balance support: Feet supported Sitting balance-Leahy Scale: Good     Standing balance support: Bilateral upper extremity supported Standing balance-Leahy Scale: Poor Standing balance comment: needs external support of the rolling walker to maintain true NWB of RLE                             Pertinent Vitals/Pain Pain Assessment Pain Assessment: No/denies pain    Home Living Family/patient expects to be discharged to:: Private residence Living Arrangements: Spouse/significant other Available Help at Discharge: Family;Available 24 hours/day Type of Home: House Home Access: Ramped  entrance     Alternate Level Stairs-Number of Steps: flight Home Layout: Able to live on main level with bedroom/bathroom;Two level;Laundry or work area in [employer] Equipment: Agricultural consultant (2 wheels);BSC/3in1;Wheelchair - manual;Tub bench;Cane - single point      Prior Function  Prior Level of Function : Independent/Modified Independent             Mobility Comments: using cane recently, generalized weakness for months per his report ADLs Comments: spouse assists as needed     Extremity/Trunk Assessment   Upper Extremity Assessment Upper Extremity Assessment: Generalized weakness    Lower Extremity Assessment Lower Extremity Assessment: Generalized weakness;RLE deficits/detail RLE Deficits / Details: trans met amputation       Communication   Communication Communication: No apparent difficulties  Cognition Arousal: Alert Behavior During Therapy: WFL for tasks assessed/performed Overall Cognitive Status: Within Functional Limits for tasks assessed                                          General Comments      Exercises     Assessment/Plan    PT Assessment Patient needs continued PT services  PT Problem List Decreased strength;Decreased range of motion;Decreased activity tolerance;Decreased balance;Decreased mobility       PT Treatment Interventions DME instruction;Gait training;Stair training;Functional mobility training;Therapeutic activities;Therapeutic exercise;Balance training;Neuromuscular re-education;Cognitive remediation;Patient/family education;Wheelchair mobility training    PT Goals (Current goals can be found in the Care Plan section)  Acute Rehab PT Goals Patient Stated Goal: to go home PT Goal Formulation: With patient/family Time For Goal Achievement: 03/04/23 Potential to Achieve Goals: Good    Frequency Min 1X/week     Co-evaluation               AM-PAC PT "6 Clicks" Mobility  Outcome Measure Help needed turning from your back to your side while in a flat bed without using bedrails?: None Help needed moving from lying on your back to sitting on the side of a flat bed without using bedrails?: None Help needed moving to and from a bed to a chair (including a wheelchair)?: None Help needed  standing up from a chair using your arms (e.g., wheelchair or bedside chair)?: None Help needed to walk in hospital room?: A Little Help needed climbing 3-5 steps with a railing? : A Lot 6 Click Score: 21    End of Session   Activity Tolerance: Patient tolerated treatment well Patient left: in bed;with call bell/phone within reach;with family/visitor present;with nursing/sitter in room (nurse and 2 family members at the bedside) Nurse Communication: Mobility status (alerted unit clerk to order post-op shoe) PT Visit Diagnosis: Difficulty in walking, not elsewhere classified (R26.2);Muscle weakness (generalized) (M62.81)    Time: 2703-5009 PT Time Calculation (min) (ACUTE ONLY): 33 min   Charges:   PT Evaluation $PT Eval Low Complexity: 1 Low PT Treatments $Gait Training: 8-22 mins PT General Charges $$ ACUTE PT VISIT: 1 Visit         Donna Bernard, PT, MPT   Manuel [name] 02/18/2023, 11:25 AM

## 2023-02-18 NOTE — Progress Notes (Cosign Needed Addendum)
Pharmacy Antibiotic Note  Manuel [name] is a 77 y.o. male presenting on 02/14/2023 with generalized weakness and worsening pain of wound on right foot. PMH significant for CAD w/ STEMI s/p DES x2 (2011), HTN, T2DM, and PAD. 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  Pharmacy has been consulted for vancomycin and cefepime dosing.  Today, 02/18/2023 Day 4 antibiotics Scr 1.0 >> 0.8 (baseline 0.8 - 1.0)  Afebrile WBC 14.5 elevated S/p transmetatarsal amputation 10/28 10/26 groin cultures: pan sensitive E. Coli; Serratia marcescens (S cefepime, ceftazidime, bactrim; R ceftriaxone); rare staph aureus (susceptibilities pending) 10/26 R foot wound: morganella morganii (S ceftazidime, Zosyn, Bactrim; R ampicillin, Unasyn)  Plan: Continue vancomycin 750 mg IV every 12 hours (eAUC 403.1, Scr 0.8, IBW, Vd 0.72, Cmin 11.9) Scr tomorrow Continue cefepime 2 g IV every 8 hours Patient is also on metronidazole 500 mg IV q12h Monitor renal function, clinical status, culture data, and LOT  Height: 5' 7.99" (172.7 cm) Weight: 82.6 kg (182 lb 1.6 oz) IBW/kg (Calculated) : 68.38  Temp (24hrs), Avg:97.9 F (36.6 C), Min:97.1 F (36.2 C), Max:98.8 F (37.1 C)  Recent Labs  Lab 02/14/23 1157 02/15/23 0513 02/17/23 0532 02/18/23 0507  WBC 10.8* 8.2  --  14.5*  CREATININE 0.96 0.85 0.80 1.00    Estimated Creatinine Clearance: 62.8 mL/min (by C-G formula based on SCr of 1 mg/dL).    No Known Allergies  Antimicrobials this admission: Vancomycin 10/25 >>  Cefepime 10/25 >>   Dose adjustments this admission: N/A  Microbiology results: 10/25 BCx: NGTD 10/25 Wound cx: gram-positive cocci in pairs and chains, gram-positive and gram-negative rods; moderate Morganella morgani. See susceptibilities above.   Thank you for involving pharmacy in this patient's care.   Effie Shy, PharmD Pharmacy Resident   02/18/2023 11:28 AM

## 2023-02-18 NOTE — Progress Notes (Signed)
Daily Progress Note   Subjective  - 1 Day Post-Op  Follow-up transmetatarsal amputation.  Doing well.  No complaints.  Objective Vitals:   02/18/23 0426 02/18/23 0429 02/18/23 1020 02/18/23 1107  BP:  (!) 164/72 (!) 191/95 (!) 153/81  Pulse:  85 80   Resp:  18 18   Temp:  (!) 97.5 F (36.4 C) (!) 97.5 F (36.4 C)   TempSrc:  Oral Oral   SpO2:  97% 100%   Weight: 82.6 kg     Height:        Physical Exam: Incisions well coapted.  No dehiscence.  Staples intact.  Minimal bleeding on the bandage.  Good perfusion of the skin flaps.  Laboratory CBC    Component Value Date/Time   WBC 14.5 (H) 02/18/2023 0507   HGB 12.8 (L) 02/18/2023 0507   HGB 15.7 02/16/2012 2013   HCT 38.5 (L) 02/18/2023 0507   HCT 44.4 02/16/2012 2013   PLT 283 02/18/2023 0507   PLT 176 02/16/2012 2013    BMET    Component Value Date/Time   NA 128 (L) 02/18/2023 0507   NA 137 02/16/2012 2013   K 4.4 02/18/2023 0507   K 3.8 02/16/2012 2013   CL 98 02/18/2023 0507   CL 102 02/16/2012 2013   CO2 23 02/18/2023 0507   CO2 25 02/16/2012 2013   GLUCOSE 410 (H) 02/18/2023 0507   GLUCOSE 154 (H) 02/16/2012 2013   BUN 39 (H) 02/18/2023 0507   BUN 10 02/16/2012 2013   CREATININE 1.00 02/18/2023 0507   CREATININE 0.72 02/16/2012 2013   CALCIUM 9.0 02/18/2023 0507   CALCIUM 9.6 02/16/2012 2013   GFRNONAA >60 02/18/2023 0507   GFRNONAA >60 02/16/2012 2013   GFRAA >60 12/09/2018 0929   GFRAA >60 02/16/2012 2013    Assessment/Planning: Status post transmetatarsal amputation right foot  New dressing applied.  Instructed him on nonweightbearing to the right foot.  Keep this elevated.  We will continue to monitor while in house.  Will need to be nonweightbearing upon discharge.   Gwyneth Revels A  02/18/2023, 3:38 PM

## 2023-02-18 NOTE — Progress Notes (Signed)
Progress Note    02/18/2023 10:39 AM 1 Day Post-Op  Subjective:  Manuel [name] is a 77 yo male who on 01/09/23 underwent Irrigation and debridement of skin, soft tissue, and muscle fascia for approximately 15 cm to the right groin wound with non-disposable negative pressure dressing placement.  On 02/14/2023 patient presented to [facility] emergency department with worsening pain to his right leg and a foul-smelling drainage from an ulcer on his right foot.  He was evaluated in vein and vascular's office on 02/14/2023 as a standard follow-up and at that time had wound VAC dressing discontinued.  Concern for right groin infection noted.  Wound cultures were taken of the right groin and susceptibilities are pending.  Patient was placed on broad-spectrum antibiotics.  He is now postop day 1 from right transmetatarsal amputation by podiatry.   Vitals:   02/18/23 0429 02/18/23 1020  BP: (!) 164/72 (!) 191/95  Pulse: 85 80  Resp: 18 18  Temp: (!) 97.5 F (36.4 C) (!) 97.5 F (36.4 C)  SpO2: 97% 100%   Physical Exam: Cardiac:  RRR, No murmur Lungs:  Clear on auscultation throughout, no rales rhonchi or wheezing  Incisions:  Right lower extremity transmetatarsal amputation bandaged by podiatry Extremities:  Right lower extremity with transmetatarsal amputation. Extremity warm to touch throughout. Left Lower extremity warm to touch positive doppler pulses.  Abdomen:  Positive bowel sounds throughout, soft, non tender and non distended.  Neurologic: AAOX4 answers questions appropriately and follows commands.   CBC    Component Value Date/Time   WBC 14.5 (H) 02/18/2023 0507   RBC 4.71 02/18/2023 0507   HGB 12.8 (L) 02/18/2023 0507   HGB 15.7 02/16/2012 2013   HCT 38.5 (L) 02/18/2023 0507   HCT 44.4 02/16/2012 2013   PLT 283 02/18/2023 0507   PLT 176 02/16/2012 2013   MCV 81.7 02/18/2023 0507   MCV 91 02/16/2012 2013   MCH 27.2 02/18/2023 0507   MCHC 33.2 02/18/2023 0507   RDW 14.0  02/18/2023 0507   RDW 13.0 02/16/2012 2013   LYMPHSABS 1.1 01/15/2023 0754   MONOABS 1.0 01/15/2023 0754   EOSABS 0.1 01/15/2023 0754   BASOSABS 0.1 01/15/2023 0754    BMET    Component Value Date/Time   NA 128 (L) 02/18/2023 0507   NA 137 02/16/2012 2013   K 4.4 02/18/2023 0507   K 3.8 02/16/2012 2013   CL 98 02/18/2023 0507   CL 102 02/16/2012 2013   CO2 23 02/18/2023 0507   CO2 25 02/16/2012 2013   GLUCOSE 410 (H) 02/18/2023 0507   GLUCOSE 154 (H) 02/16/2012 2013   BUN 39 (H) 02/18/2023 0507   BUN 10 02/16/2012 2013   CREATININE 1.00 02/18/2023 0507   CREATININE 0.72 02/16/2012 2013   CALCIUM 9.0 02/18/2023 0507   CALCIUM 9.6 02/16/2012 2013   GFRNONAA >60 02/18/2023 0507   GFRNONAA >60 02/16/2012 2013   GFRAA >60 12/09/2018 0929   GFRAA >60 02/16/2012 2013    INR    Component Value Date/Time   INR 1.3 (H) 01/15/2023 1011     Intake/Output Summary (Last 24 hours) at 02/18/2023 1039 Last data filed at 02/18/2023 1034 Gross per 24 hour  Intake 1200 ml  Output 10 ml  Net 1190 ml     Assessment/Plan:  77 y.o. male is s/p Right transmetatarsal amputation.  1 Day Post-Op   PLAN: Continue Broad Spectrum antibiotics for right groin as susceptibilities remain pending.    DVT prophylaxis:  ASA  81 mg Daily and Plavix 75 mg Daily   Dimitrius Steedman R [facility] 02/18/2023 10:39 AM

## 2023-02-18 NOTE — Progress Notes (Signed)
Progress Note   Patient: Manuel [name] OAC:[id number] DOB: [birth year] DOA: 02/14/2023     4 DOS: the patient was seen and examined on 02/18/2023   Brief hospital course: Taken from H&P.  Manuel [name] is a 77 y.o. male with medical history significant of HFrEF with last EF of 25-30% (July 2024) 2/2 ischemic cardiomyopathy, CAD s/p CABG, peripheral artery disease s/p multiple interventions (most recently July 2024), insulin-dependent type 2 diabetes, hypertension, hyperlipidemia, OSA not on CPAP, who presents to the ED due to worsening generalized weakness over the past 6 weeks.He is also lost approximately 20 pounds during this time with a lack of appetite. He notes that during this time, he is also been unable to sleep.   He also experiencing worsening pain in his right leg and foul-smelling drainage from his ulcer on his right foot. He notes that the ulcer has been there for nearly 2 years now though.   On arrival to ED vital stable, WBC of 10.8, sodium 133, glucose 194. COVID-19, RSV and influenza PCR negative.  UA without evidence of infection. Right foot x-ray was obtained with no evidence of osteomyelitis.  Podiatry was consulted and patient was started on vancomycin/cefepime/Flagyl for concern of diabetic infected foot ulcer.  10/26: Vital stable.  Labs stable, leukocytosis resolved.  Podiatry sent, wound culture which is pending, preliminary blood cultures negative.  BNP elevated at 445 but much improved than a month ago when it was 1760.  MRI of foot Low-level marrow edema in the base of the proximal phalanx of the third toe and also in the plantar portion of the head of the third metatarsal. Early osteomyelitis is possible. Small focus of artifact in the subcutaneous tissues just deep to the cutaneous surface plantar to the third MTP joint, likely correlating with the radiographic density, this could be a small focus of glass or other foreign body. Possible local ulceration.   Continuing broad-spectrum antibiotics.  Transmetatarsal amputation was planned for Monday by podiatry.  Patient also has right groin wound, vascular surgery removed wound VAC recently.  They evaluated her and according to their note he has adequate perfusion but concern of right groin infection so cultures were obtained.  10/27: Hemodynamically stable, going for transmetatarsal amputation tomorrow.  Wound culture with gram-positive cocci in pairs and chains, gram-positive and gram-negative rods.  10/28: Hemodynamically stable, groin wound with pansensitive E. coli and rare Staph aureus, foot wound with Morganella Morganii which shows resistance to ampicillin and Augmentin only,  Going for transmetatarsal amputation later today.  ID was also consulted.  10/29: Remained hemodynamically stable.  S/p transmetatarsal amputation yesterday, tolerated the procedure well.  Some leukocytosis today likely reactive to procedure, elevated CBG-patient also received a dose of Decadron during procedure.  Pseudohyponatremia secondary to hyperglycemia.  Increasing the dose of Semglee to 10 units twice daily and mealtime coverage to 6 units.  PT is recommending home health  Assessment and Plan: * Diabetic foot ulcer (HCC) Patient is presenting with evidence of progression of chronic ulcer on the plantar aspect of the right foot.  Given location, consistent with a diabetic foot ulcer, although there is likely an ischemic component as well. MRI of foot with concern of osteo.,  Cultures with Morganella which shows resistance to ampicillin and Augmentin only. 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  Per vascular surgery patient has adequate circulation but concern of  right groin wound infection so cultures were taken-growing pansensitive E. Coli. -Continue with broad-spectrum  antibiotics  Generalized weakness Patient notes gradually worsening fatigue and generalized weakness on exertion for the last 6 weeks, in addition to insomnia, poor appetite and weight loss.  There may be a component of underlying infection contributing to this, in addition to patient's comorbidities including HFrEF, however on further discussion, I suspect patient is depressed versus adjustment disorder. TSH and B12 normal, B1 pending - PT/OT recommending home health -Started on mirtazapine 15 mg nightly and amitriptyline was discontinued  PAD (peripheral artery disease) (HCC) Complicated history PAD with multiple interventions most recently in July, At which time he underwent bilateral femoral artery endarterectomies, embolectomy of the right SFA, and multiple stent placement.  Complicated by poor wound healing requiring wound VAC, that was removed recently as outpatient Vascular surgery reevaluated him and there was a concern of right groin infection so cultures were taken, patient is already on broad-spectrum antibiotics - Continue DAPT and statin  Hypertension Patient was taking both losartan and Entresto, in addition to Coreg, but remains hypertensive in the ED.  - Discontinue home losartan - Restart home Entresto and Coreg - Hydralazine as needed for SBP over 180  Diabetes mellitus type 2, insulin dependent (HCC) CBG significantly elevated, patient did receive a dose of IV Decadron during procedure. - Hold home antiglycemic agents - SSI, moderate - Increase Semglee to 10 units twice daily -Increase mealtime coverage to 6 unit  Alcohol abuse Patient states that he drinks several beers a day in addition to liquor.  On previous admissions, no evidence of alcohol withdrawal.  He realizes that he needs to discontinue his alcohol use but has been struggling with it.  - CIWA monitoring without Ativan - Daily multi vitamin folic acid and thiamine  Chronic combined systolic and  diastolic CHF (congestive heart failure) (HCC) At this time, patient appears euvolemic.  - Continue home GDMT and torsemide - Daily weights  Coronary artery disease with history of myocardial infarction without history of CABG - Continue home regimen      Subjective: Patient was lying down comfortably when seen today.  Multiple family members at bedside.  Denies any pain.  Physical Exam: Vitals:   02/18/23 0426 02/18/23 0429 02/18/23 1020 02/18/23 1107  BP:  (!) 164/72 (!) 191/95 (!) 153/81  Pulse:  85 80   Resp:  18 18   Temp:  (!) 97.5 F (36.4 C) (!) 97.5 F (36.4 C)   TempSrc:  Oral Oral   SpO2:  97% 100%   Weight: 82.6 kg     Height:       General.  Frail elderly man, in no acute distress. Pulmonary.  Lungs clear bilaterally, normal respiratory effort. CV.  Regular rate and rhythm, no JVD, rub or murmur. Abdomen.  Soft, nontender, nondistended, BS positive. CNS.  Alert and oriented .  No focal neurologic deficit. Extremities.  No edema, right foot with transmetatarsal amputation and bandage Psychiatry.  Judgment and insight appears normal.     Data Reviewed: Prior data reviewed  Family Communication: Discussed with patient, multiple family member including wife at bedside  Disposition: Status is: Inpatient Remains inpatient appropriate because: Severity of illness  Planned Discharge Destination: Home with home health Time spent: 44 minutes  This record has been created using Conservation officer, historic buildings. Errors have been sought and corrected,but may not always be located. Such creation errors do not reflect on the standard of care.   Author:  Arnetha Courser, MD 02/18/2023 2:42 PM  For on call review [email or web address].

## 2023-02-18 NOTE — Assessment & Plan Note (Signed)
CBG significantly elevated, patient did receive a dose of IV Decadron during procedure. - Hold home antiglycemic agents - SSI, moderate - Increase Semglee to 10 units twice daily -Increase mealtime coverage to 6 unit

## 2023-02-19 DIAGNOSIS — L97509 Non-pressure chronic ulcer of other part of unspecified foot with unspecified severity: Secondary | ICD-10-CM | POA: Diagnosis not present

## 2023-02-19 DIAGNOSIS — I739 Peripheral vascular disease, unspecified: Secondary | ICD-10-CM | POA: Diagnosis not present

## 2023-02-19 DIAGNOSIS — I10 Essential (primary) hypertension: Secondary | ICD-10-CM | POA: Diagnosis not present

## 2023-02-19 DIAGNOSIS — E11621 Type 2 diabetes mellitus with foot ulcer: Secondary | ICD-10-CM | POA: Diagnosis not present

## 2023-02-19 DIAGNOSIS — Z794 Long term (current) use of insulin: Secondary | ICD-10-CM | POA: Diagnosis not present

## 2023-02-19 DIAGNOSIS — R531 Weakness: Secondary | ICD-10-CM | POA: Diagnosis not present

## 2023-02-19 LAB — AEROBIC CULTURE W GRAM STAIN (SUPERFICIAL SPECIMEN)

## 2023-02-19 LAB — GLUCOSE, CAPILLARY
Glucose-Capillary: 238 mg/dL — ABNORMAL HIGH (ref 70–99)
Glucose-Capillary: 240 mg/dL — ABNORMAL HIGH (ref 70–99)

## 2023-02-19 LAB — CREATININE, SERUM
Creatinine, Ser: 0.91 mg/dL (ref 0.61–1.24)
GFR, Estimated: >60 mL/min (ref >60)

## 2023-02-19 LAB — CULTURE, BLOOD (ROUTINE X 2)
Culture: NO GROWTH
Culture: NO GROWTH
Special Requests: ADEQUATE
Special Requests: ADEQUATE

## 2023-02-19 LAB — SURGICAL PATHOLOGY

## 2023-02-19 MED ORDER — AMOXICILLIN-POT CLAVULANATE 875-125 MG PO TABS: 1.0000 | ORAL_TABLET | Freq: Two times a day (BID) | ORAL | 0 refills | Status: AC

## 2023-02-19 MED ORDER — CIPROFLOXACIN HCL 500 MG PO TABS
500.0000 mg | ORAL_TABLET | Freq: Two times a day (BID) | ORAL | 0 refills | Status: AC
Start: 2023-02-19 — End: 2023-03-01

## 2023-02-19 MED ORDER — MIRTAZAPINE 15 MG PO TABS: 15.0000 mg | ORAL_TABLET | Freq: Every day | ORAL | 1 refills | Status: DC

## 2023-02-19 MED ORDER — OXYCODONE HCL 5 MG PO TABS: 5.0000 mg | ORAL_TABLET | ORAL | 0 refills | Status: DC | PRN

## 2023-02-19 MED ORDER — VITAMIN B-1 100 MG PO TABS: 100.0000 mg | ORAL_TABLET | Freq: Every day | ORAL | 0 refills | Status: AC

## 2023-02-19 MED ORDER — ADULT MULTIVITAMIN W/MINERALS CH: 1.0000 | ORAL_TABLET | Freq: Every day | ORAL | 1 refills | Status: DC

## 2023-02-19 MED ORDER — FOLIC ACID 1 MG PO TABS: 1.0000 mg | ORAL_TABLET | Freq: Every day | ORAL | 0 refills | Status: DC

## 2023-02-19 MED ORDER — ACETAMINOPHEN 325 MG PO TABS: 650.0000 mg | ORAL_TABLET | Freq: Four times a day (QID) | ORAL | 0 refills | Status: DC | PRN

## 2023-02-19 MED ORDER — POLYETHYLENE GLYCOL 3350 17 G PO PACK: 17.0000 g | PACK | Freq: Every day | ORAL | 0 refills | Status: DC | PRN

## 2023-02-19 NOTE — Discharge Summary (Signed)
Physician Discharge Summary   Patient: Manuel [name] MRN: [id number] DOB: 29-Jun-1945  Admit date:     02/14/2023  Discharge date: 02/19/23  Discharge Physician: Arnetha Courser   PCP: Gracelyn Nurse, MD   Recommendations at discharge:  Please obtain CBC and BMP on follow-up Please ensure completion of antibiotics Follow-up with podiatry Follow-up with infectious disease Follow-up with primary care provider Follow-up with vascular surgery  Discharge Diagnoses: Principal Problem:   Diabetic foot ulcer (HCC) Active Problems:   Generalized weakness   PAD (peripheral artery disease) (HCC)   Hypertension   Diabetes mellitus type 2, insulin dependent (HCC)   Alcohol abuse   Chronic combined systolic and diastolic CHF (congestive heart failure) (HCC)   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  Manuel [name] is a 77 y.o. male with medical history significant of HFrEF with last EF of 25-30% (July 2024) 2/2 ischemic cardiomyopathy, CAD s/p CABG, peripheral artery disease s/p multiple interventions (most recently July 2024), insulin-dependent type 2 diabetes, hypertension, hyperlipidemia, OSA not on CPAP, who presents to the ED due to worsening generalized weakness over the past 6 weeks.He is also lost approximately 20 pounds during this time with a lack of appetite. He notes that during this time, he is also been unable to sleep.   He also experiencing worsening pain in his right leg and foul-smelling drainage from his ulcer on his right foot. He notes that the ulcer has been there for nearly 2 years now though.   On arrival to ED vital stable, WBC of 10.8, sodium 133, glucose 194. COVID-19, RSV and influenza PCR negative.  UA without evidence of infection. Right foot x-ray was obtained with no evidence of osteomyelitis.  Podiatry was consulted and patient was started on vancomycin/cefepime/Flagyl for  concern of diabetic infected foot ulcer.  10/26: Vital stable.  Labs stable, leukocytosis resolved.  Podiatry sent, wound culture which is pending, preliminary blood cultures negative.  BNP elevated at 445 but much improved than a month ago when it was 1760.  MRI of foot Low-level marrow edema in the base of the proximal phalanx of the third toe and also in the plantar portion of the head of the third metatarsal. Early osteomyelitis is possible. Small focus of artifact in the subcutaneous tissues just deep to the cutaneous surface plantar to the third MTP joint, likely correlating with the radiographic density, this could be a small focus of glass or other foreign body. Possible local ulceration.  Continuing broad-spectrum antibiotics.  Transmetatarsal amputation was planned for Monday by podiatry.  Patient also has right groin wound, vascular surgery removed wound VAC recently.  They evaluated her and according to their note he has adequate perfusion but concern of right groin infection so cultures were obtained.  10/27: Hemodynamically stable, going for transmetatarsal amputation tomorrow.  Wound culture with gram-positive cocci in pairs and chains, gram-positive and gram-negative rods.  10/28: Hemodynamically stable, groin wound with pansensitive E. coli and rare Staph aureus, foot wound with Morganella Morganii which shows resistance to ampicillin and Augmentin only,  Going for transmetatarsal amputation later today.  ID was also consulted.  10/29: Remained hemodynamically stable.  S/p transmetatarsal amputation yesterday, tolerated the procedure well.  Some leukocytosis today likely reactive to procedure, elevated CBG-patient also received a dose of Decadron during procedure.  Pseudohyponatremia secondary to hyperglycemia.  Increasing the dose of Semglee to 10 units twice daily and mealtime coverage to  6 units.  PT is recommending home health.  10/30: Patient remained hemodynamically stable.   ID is recommending ciprofloxacin and Augmentin for 10 more days which has prescribed.  Patient was also given some new supplements.  His amitriptyline was switched with Remeron.  Patient was recently prescribed Lyrica which he will continue on discharge, he should stop using Neurontin if using Lyrica.  Home health services were ordered. Patient will continue on current medications and need to have a close follow-up with his providers for further recommendations.  Assessment and Plan: * Diabetic foot ulcer (HCC) Patient is presenting with evidence of progression of chronic ulcer on the plantar aspect of the right foot.  Given location, consistent with a diabetic foot ulcer, although there is likely an ischemic component as well. MRI of foot with concern of osteo.,  Cultures with Morganella which shows resistance to ampicillin and Augmentin only. 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  Per vascular surgery patient has adequate circulation but concern of right groin wound infection so cultures were taken-growing pansensitive E. Coli. -Patient received broad-spectrum antibiotics through IV while in the hospital and is being discharged on 10 more days of ciprofloxacin and Augmentin after having discussion with infectious disease.  Generalized weakness Patient notes gradually worsening fatigue and generalized weakness on exertion for the last 6 weeks, in addition to insomnia, poor appetite and weight loss.  There may be a component of underlying infection contributing to this, in addition to patient's comorbidities including HFrEF, however on further discussion, I suspect patient is depressed versus adjustment disorder. TSH and B12 normal, B1 pending - PT/OT recommending home health -Started on mirtazapine 15 mg nightly and amitriptyline was discontinued  PAD (peripheral  artery disease) (HCC) Complicated history PAD with multiple interventions most recently in July, At which time he underwent bilateral femoral artery endarterectomies, embolectomy of the right SFA, and multiple stent placement.  Complicated by poor wound healing requiring wound VAC, that was removed recently as outpatient Vascular surgery reevaluated him and there was a concern of right groin infection so cultures were taken, patient is already on broad-spectrum antibiotics - Continue DAPT and statin  Hypertension Patient was taking both losartan and Entresto, in addition to Coreg, but remains hypertensive in the ED.  - Discontinue home losartan - Restart home Entresto and Coreg - Hydralazine as needed for SBP over 180  Diabetes mellitus type 2, insulin dependent (HCC) CBG significantly elevated, patient did receive a dose of IV Decadron during procedure. - Hold home antiglycemic agents - SSI, moderate - Increase Semglee to 10 units twice daily -Increase mealtime coverage to 6 unit  Alcohol abuse Patient states that he drinks several beers a day in addition to liquor.  On previous admissions, no evidence of alcohol withdrawal.  He realizes that he needs to discontinue his alcohol use but has been struggling with it.  - CIWA monitoring without Ativan - Daily multi vitamin folic acid and thiamine  Chronic combined systolic and diastolic CHF (congestive heart failure) (HCC) At this time, patient appears euvolemic.  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 and patient was instructed, not to drive, operate heavy machinery, perform activities at heights, swimming or participation  in water activities or provide baby-sitting services while on Pain, Sleep and Anxiety  Medications; until their outpatient Physician has advised to do so again. Also recommended to not to take more than prescribed Pain, Sleep and Anxiety Medications.  Consultants: Podiatry.  Infectious disease.  Vascular surgery Procedures performed: Transmetatarsal amputation of right foot Disposition: Home health Diet recommendation:  Discharge Diet Orders (From admission, onward)     Start     Ordered   02/19/23 0000  Diet - low sodium heart healthy        02/19/23 1305           Cardiac and Carb modified diet DISCHARGE MEDICATION: Allergies as of 02/19/2023   No Known Allergies      Medication List     STOP taking these medications    amitriptyline 25 MG tablet Commonly known as: ELAVIL   gabapentin 400 MG capsule Commonly known as: NEURONTIN   levocetirizine 5 MG tablet Commonly known as: XYZAL   losartan 25 MG tablet Commonly known as: COZAAR       TAKE these medications    acetaminophen 325 MG tablet Commonly known as: TYLENOL Take 2 tablets (650 mg total) by mouth every 6 (six) hours as needed for mild pain (pain score 1-3) (or Fever >/= 101).   amoxicillin-clavulanate 875-125 MG tablet Commonly known as: AUGMENTIN Take 1 tablet by mouth 2 (two) times daily for 10 days.   ascorbic acid 500 MG tablet Commonly known as: VITAMIN C Take 1 tablet (500 mg total) by mouth daily.   aspirin EC 81 MG tablet Take 1 tablet (81 mg total) by mouth daily.   atorvastatin 80 MG tablet Commonly known as: LIPITOR Take 1 tablet (80 mg total) by mouth daily. What changed: when to take this   carvedilol 25 MG tablet Commonly known as: COREG Take 25 mg by mouth 2 (two) times daily with a meal.   ciprofloxacin 500 MG tablet Commonly known as: CIPRO Take 1 tablet (500 mg total) by mouth 2 (two) times daily for 10 days.   clopidogrel 75 MG tablet Commonly known as: PLAVIX Take 1 tablet (75 mg total) by mouth daily.   dapagliflozin propanediol 10 MG Tabs  tablet Commonly known as: FARXIGA Take 1 tablet (10 mg total) by mouth daily.   Ensure Max Protein Liqd Take 330 mLs (11 oz total) by mouth 2 (two) times daily. What changed: when to take this   fluticasone 50 MCG/ACT nasal spray Commonly known as: FLONASE Place 1 spray into both nostrils daily as needed for allergies or rhinitis.   folic acid 1 MG tablet Commonly known as: FOLVITE Take 1 tablet (1 mg total) by mouth daily. Start taking on: February 20, 2023   insulin aspart protamine- aspart (70-30) 100 UNIT/ML injection Commonly known as: NOVOLOG MIX 70/30 Inject 30 Units into the skin 2 (two) times daily with a meal.   iron polysaccharides 150 MG capsule Commonly known as: NIFEREX Take 1 capsule (150 mg total) by mouth daily.   mirtazapine 15 MG tablet Commonly known as: REMERON Take 1 tablet (15 mg total) by mouth at bedtime.   multivitamin with minerals Tabs tablet Take 1 tablet by mouth daily. Start taking on: February 20, 2023   oxyCODONE 5 MG immediate release tablet Commonly known as: Oxy IR/ROXICODONE Take 1 tablet (5 mg total) by mouth every 4 (four) hours as needed for moderate pain (pain score 4-6). What changed:  how much to take when to take  this reasons to take this   polyethylene glycol 17 g packet Commonly known as: MIRALAX / GLYCOLAX Take 17 g by mouth daily as needed for mild constipation.   pregabalin 75 MG capsule Commonly known as: Lyrica Take 1 capsule (75 mg total) by mouth 2 (two) times daily.   sacubitril-valsartan 24-26 MG Commonly known as: ENTRESTO Take 1 tablet by mouth every 12 (twelve) hours.   senna-docusate 8.6-50 MG tablet Commonly known as: Senokot-S Take 1 tablet by mouth at bedtime as needed for mild constipation.   spironolactone 50 MG tablet Commonly known as: ALDACTONE Take 1 tablet (50 mg total) by mouth daily.   tamsulosin 0.4 MG Caps capsule Commonly known as: FLOMAX Take 1 capsule (0.4 mg total) by mouth daily  after supper.   thiamine 100 MG tablet Commonly known as: Vitamin B-1 Take 1 tablet (100 mg total) by mouth daily. Start taking on: February 20, 2023   torsemide 20 MG tablet Commonly known as: DEMADEX Take 1 tablet (20 mg total) by mouth daily.               Discharge Care Instructions  (From admission, onward)           Start     Ordered   02/19/23 0000  Leave dressing on - Keep it clean, dry, and intact until clinic visit        02/19/23 1305            Follow-up Information     Rosetta Posner, [name]. Schedule an appointment as soon as possible for a visit in 1 week(s).   Specialty: Podiatry Why: For wound re-check Contact information: [address] [place] [address] [phone]         Gracelyn Nurse, MD. Schedule an appointment as soon as possible for a visit in 1 week(s).   Specialty: Internal Medicine Contact information: [address] [facility] [place] [address] [phone]                Discharge Exam: Filed Weights   02/17/23 0845 02/18/23 0426 02/19/23 0349  Weight: 80.1 kg 82.6 kg 82.1 kg   General.  Well-developed elderly man, in no acute distress. Pulmonary.  Lungs clear bilaterally, normal respiratory effort. CV.  Regular rate and rhythm, no JVD, rub or murmur. Abdomen.  Soft, nontender, nondistended, BS positive. CNS.  Alert and oriented .  No focal neurologic deficit. Extremities.  No edema, pulses intact and symmetrical, right foot with clean bandage Psychiatry.  Judgment and insight appears normal.   Condition at discharge: stable  The results of significant diagnostics from this hospitalization (including imaging, microbiology, ancillary and laboratory) are listed below for reference.   Imaging Studies: DG Foot Complete Right  Result Date: 02/17/2023 CLINICAL DATA:  Postop. EXAM: RIGHT FOOT COMPLETE - 3+ VIEW COMPARISON:  Preoperative imaging. FINDINGS: Interval transmetatarsal  amputation of the first through third rays. Prior transmetatarsal amputation of the fourth and fifth rays, suspect progressive amputation. Expected postsurgical change in the soft tissues. Overlying skin staples in place. IMPRESSION: Transmetatarsal amputation of all 5 rays. Electronically Signed   By: Narda Rutherford M.D.   On: 02/17/2023 21:09   DG MINI C-ARM IMAGE ONLY  Result Date: 02/17/2023 There is no interpretation for this exam.  This order is for images obtained during a surgical procedure.  Please See "Surgeries" Tab for more information regarding the procedure.   [place] RT LOWER EXTREM LTD SOFT TISSUE NON VASCULAR  Result  Date: 02/15/2023 CLINICAL DATA:  [id number] Abscess of right groin [id number] EXAM: ULTRASOUND RIGHT LOWER EXTREMITY LIMITED TECHNIQUE: Ultrasound examination of the lower extremity soft tissues was performed in the area of clinical concern. COMPARISON:  None Available. FINDINGS: Targeted ultrasound was performed of the RIGHT inguinal region at the site of concern. There are postsurgical changes noted along the site of concern. There is a small amount of internal mobile fluid noted tracking through the postsurgical changes spanning approximately 15 x 5 by 6 mm. There is a suggestion of underlying interdigitating fluid and edema into the superficial tissues, poorly visualized. No focal drainable fluid collection. IMPRESSION: There is a small amount of mobile fluid noted tracking through the postsurgical changes of the RIGHT inguinal region. No focal drainable fluid collection. Interdigitating fluid tracks along the subcutaneous tissues and out of the field of view. If there is a clinical concern for deeper abscess, dedicated cross-sectional imaging would be recommended. Electronically Signed   By: Meda Klinefelter M.D.   On: 02/15/2023 14:29   MR FOOT RIGHT W WO CONTRAST  Result Date: 02/15/2023 CLINICAL DATA:  Soft tissue infection, ulcer along the plantar foot. EXAM: MRI OF THE  RIGHT FOREFOOT WITHOUT AND WITH CONTRAST TECHNIQUE: Multiplanar, multisequence MR imaging of the right forefoot was performed before and after the administration of intravenous contrast. CONTRAST:  8mL GADAVIST GADOBUTROL 1 MMOL/ML IV SOLN COMPARISON:  02/14/2023 radiographs FINDINGS: Despite efforts by the technologist and patient, motion artifact is present on today's exam and could not be eliminated. This reduces exam sensitivity and specificity. Bones/Joint/Cartilage Amputation of the fourth and fifth digits at the distal metatarsal level. Low-level marrow edema in the base of the proximal phalanx of the third toe and also in the plantar portion of the head of the third metatarsal. Mild associated marrow enhancement for example on images 11-12 of series 12. Early osteomyelitis is a distinct possibility particularly in the proximal phalanx. The remaining toes are unremarkable. No other findings of potential osteomyelitis. Ligaments This frank ligament appears grossly intact. Muscles and Tendons Diffuse trace muscular edema, likely neurogenic. Flexor hallucis longus tendinopathy with mild expansion and accentuated signal in the tendon. Soft tissues Mild dorsal and plantar subcutaneous edema in the foot. No drainable abscess. Plantar to the third MTP joint, a small focus of artifact is present in the subcutaneous tissues just deep to the cutaneous surface for example on image 27 series 4 and image 19 series 3, likely correlating with the radiographic density, this could be a small focus of glass or other foreign body. Possible local ulceration. IMPRESSION: 1. Low-level marrow edema in the base of the proximal phalanx of the third toe and also in the plantar portion of the head of the third metatarsal. Early osteomyelitis is a distinct possibility particularly in the proximal phalanx. 2. Small focus of artifact in the subcutaneous tissues just deep to the cutaneous surface plantar to the third MTP joint, likely  correlating with the radiographic density, this could be a small focus of glass or other foreign body. Possible local ulceration. 3. Mild dorsal and plantar subcutaneous edema in the foot. 4. Diffuse trace muscular edema, likely neurogenic. 5. Flexor hallucis longus tendinopathy. 6. Amputation of the fourth and fifth digits at the distal metatarsal level. Electronically Signed   By: Gaylyn Rong M.D.   On: 02/15/2023 09:49   DG Foot Complete Right  Result Date: 02/14/2023 CLINICAL DATA:  Unhealing wound to second metatarsal area. History of diabetic neuropathy. EXAM: RIGHT FOOT COMPLETE -  3+ VIEW COMPARISON:  Foot radiographs 11/10/2022. FINDINGS: There has been interval amputation of fourth and fifth rays at the distal metatarsal metaphyses. The amputation margins are smooth, without definite osseous irregularity or erosion. There is soft tissue lucency along the undersurface of the third metatarsophalangeal joint likely corresponding to the reported ulceration. A small focus of hyperdensity within the lucency may reflect nonspecific calcification or less likely a bone fragment as no donor site is identified. There is no definite osseous erosion or destruction of the underlying bone. Otherwise, there is no acute osseous finding. Mild Achilles enthesopathy is again noted. There is diffuse soft tissue swelling over the dorsum of the foot. IMPRESSION: 1. Interval amputation of fourth and fifth rays at the distal metatarsal metaphysis without definite osseous erosion or destruction along the amputation margins. 2. Soft tissue ulceration along the undersurface of the third metatarsophalangeal joint with nonspecific calcification or less likely a bony fragment within the ulceration. No definite osseous erosion or destruction of the underlying bone. Consider MRI for further evaluation if indicated. Electronically Signed   By: Lesia Hausen M.D.   On: 02/14/2023 18:19    Microbiology: Results for orders placed  or performed during the hospital encounter of 02/14/23  Resp panel by RT-PCR (RSV, Flu A&B, Covid) Anterior Nasal Swab     Status: None   Collection Time: 02/14/23 11:57 AM   Specimen: Anterior Nasal Swab  Result Value Ref Range Status   SARS Coronavirus 2 by RT PCR NEGATIVE NEGATIVE Final    Comment: (NOTE) SARS-CoV-2 target nucleic acids are NOT DETECTED.  The SARS-CoV-2 RNA is generally detectable in upper respiratory specimens during the acute phase of infection. The lowest concentration of SARS-CoV-2 viral copies this assay can detect is 138 copies/mL. A negative result does not preclude SARS-Cov-2 infection and should not be used as the sole basis for treatment or other patient management decisions. A negative result may occur with  improper specimen collection/handling, submission of specimen other than nasopharyngeal swab, presence of viral mutation(s) within the areas targeted by this assay, and inadequate number of viral copies(<138 copies/mL). A negative result must be combined with clinical observations, patient history, and epidemiological information. The expected result is Negative.  Fact Sheet for Patients:  BloggerCourse.com  Fact Sheet for Healthcare Providers:  SeriousBroker.it  This test is no t yet approved or cleared by the [place] FDA and  has been authorized for detection and/or diagnosis of SARS-CoV-2 by FDA under an Emergency Use Authorization (EUA). This EUA will remain  in effect (meaning this test can be used) for the duration of the COVID-19 declaration under Section 564(b)(1) of the Act, 21 U.S.C.section 360bbb-3(b)(1), unless the authorization is terminated  or revoked sooner.       Influenza A by PCR NEGATIVE NEGATIVE Final   Influenza B by PCR NEGATIVE NEGATIVE Final    Comment: (NOTE) The Xpert Xpress SARS-CoV-2/FLU/RSV plus assay is intended as an aid in the diagnosis of influenza from  Nasopharyngeal swab specimens and should not be used as a sole basis for treatment. Nasal washings and aspirates are unacceptable for Xpert Xpress SARS-CoV-2/FLU/RSV testing.  Fact Sheet for Patients: BloggerCourse.com  Fact Sheet for Healthcare Providers: SeriousBroker.it  This test is not yet approved or cleared by the [place] FDA and has been authorized for detection and/or diagnosis of SARS-CoV-2 by FDA under an Emergency Use Authorization (EUA). This EUA will remain in effect (meaning this test can be used) for the duration of the COVID-19 declaration  under Section 564(b)(1) of the Act, 21 U.S.C. section 360bbb-3(b)(1), unless the authorization is terminated or revoked.     Resp Syncytial Virus by PCR NEGATIVE NEGATIVE Final    Comment: (NOTE) Fact Sheet for Patients: BloggerCourse.com  Fact Sheet for Healthcare Providers: SeriousBroker.it  This test is not yet approved or cleared by the [place] FDA and has been authorized for detection and/or diagnosis of SARS-CoV-2 by FDA under an Emergency Use Authorization (EUA). This EUA will remain in effect (meaning this test can be used) for the duration of the COVID-19 declaration under Section 564(b)(1) of the Act, 21 U.S.C. section 360bbb-3(b)(1), unless the authorization is terminated or revoked.  Performed at [facility], [address] [place], [place] [address]   Blood culture (routine x 2)     Status: None   Collection Time: 02/14/23  5:20 PM   Specimen: BLOOD  Result Value Ref Range Status   Specimen Description BLOOD BLOOD RIGHT ARM  Final   Special Requests   Final    BOTTLES DRAWN AEROBIC AND ANAEROBIC Blood Culture adequate volume   Culture   Final    NO GROWTH 5 DAYS Performed at [facility], [address], [place], [place] [address]    Report Status 02/19/2023 FINAL  Final   Blood culture (routine x 2)     Status: None   Collection Time: 02/14/23  5:23 PM   Specimen: BLOOD  Result Value Ref Range Status   Specimen Description BLOOD BLOOD LEFT ARM  Final   Special Requests   Final    BOTTLES DRAWN AEROBIC AND ANAEROBIC Blood Culture adequate volume   Culture   Final    NO GROWTH 5 DAYS Performed at [facility], [address], [place], [place] [address]    Report Status 02/19/2023 FINAL  Final  Aerobic/Anaerobic Culture w Gram Stain (surgical/deep wound)     Status: None   Collection Time: 02/15/23  9:14 AM   Specimen: Wound  Result Value Ref Range Status   Specimen Description   Final    WOUND Performed at [facility], [address], [place], [place] [address]    Special Requests   Final    RF Performed at [facility], [address] [place], [place] [address]    Gram Stain   Final    RARE WBC PRESENT, PREDOMINANTLY MONONUCLEAR MODERATE GRAM POSITIVE RODS MODERATE GRAM NEGATIVE RODS FEW GRAM POSITIVE COCCI IN PAIRS AND CHAINS    Culture   Final    MODERATE MORGANELLA MORGANII MODERATE CORYNEBACTERIUM AMYCOLATUM Standardized susceptibility testing for this organism is not available. ABUNDANT BACTEROIDES SPECIES NOT FRAGILIS BETA LACTAMASE POSITIVE Performed at [facility], [address] [place], [place] [address]    Report Status 02/18/2023 FINAL  Final   Organism ID, Bacteria MORGANELLA MORGANII  Final      Susceptibility   Morganella morganii - MIC*    AMPICILLIN >=32 RESISTANT Resistant     CEFTAZIDIME <=1 SENSITIVE Sensitive     CIPROFLOXACIN <=0.25 SENSITIVE Sensitive     GENTAMICIN <=1 SENSITIVE Sensitive     IMIPENEM 1 SENSITIVE Sensitive     TRIMETH/SULFA <=20 SENSITIVE Sensitive     AMPICILLIN/SULBACTAM >=32 RESISTANT Resistant     PIP/TAZO <=4 SENSITIVE Sensitive ug/mL    * MODERATE MORGANELLA MORGANII  Aerobic Culture w Gram Stain (superficial specimen)     Status: None    Collection Time: 02/15/23  9:42 AM   Specimen: Wound  Result Value Ref Range Status   Specimen Description   Final    WOUND Performed at [facility], [address], [place], [place] [address]    Special Requests   Final    GROIN RIGHT Performed at [facility], [address] [place], [place] [address]    Gram Stain   Final    FEW WBC PRESENT,BOTH PMN AND MONONUCLEAR RARE GRAM POSITIVE COCCI IN PAIRS Performed at [facility], [address], [place], [place] [address]    Culture   Final    RARE ESCHERICHIA COLI RARE SERRATIA MARCESCENS RARE STAPHYLOCOCCUS AUREUS    Report Status 02/19/2023 FINAL  Final   Organism ID, Bacteria ESCHERICHIA COLI  Final   Organism ID, Bacteria SERRATIA MARCESCENS  Final   Organism ID, Bacteria STAPHYLOCOCCUS AUREUS  Final      Susceptibility   Escherichia coli - MIC*    AMPICILLIN <=2 SENSITIVE Sensitive     CEFEPIME <=0.12 SENSITIVE Sensitive     CEFTAZIDIME <=1 SENSITIVE Sensitive     CEFTRIAXONE <=0.25 SENSITIVE Sensitive     CIPROFLOXACIN <=0.25 SENSITIVE Sensitive     GENTAMICIN <=1 SENSITIVE Sensitive     IMIPENEM <=0.25 SENSITIVE Sensitive     TRIMETH/SULFA <=20 SENSITIVE Sensitive     AMPICILLIN/SULBACTAM <=2 SENSITIVE Sensitive     PIP/TAZO <=4 SENSITIVE Sensitive ug/mL    * RARE ESCHERICHIA COLI   Staphylococcus aureus - MIC*    CIPROFLOXACIN >=8 RESISTANT Resistant     ERYTHROMYCIN >=8 RESISTANT Resistant     GENTAMICIN <=0.5 SENSITIVE Sensitive     OXACILLIN <=0.25 SENSITIVE Sensitive     TETRACYCLINE >=16 RESISTANT Resistant     VANCOMYCIN 1 SENSITIVE Sensitive     TRIMETH/SULFA <=10 SENSITIVE Sensitive     CLINDAMYCIN >=8 RESISTANT Resistant     RIFAMPIN <=0.5 SENSITIVE Sensitive     Inducible Clindamycin NEGATIVE Sensitive     LINEZOLID 2 SENSITIVE Sensitive     * RARE STAPHYLOCOCCUS AUREUS   Serratia marcescens - MIC*    CEFEPIME <=0.12 SENSITIVE Sensitive     CEFTAZIDIME <=1 SENSITIVE  Sensitive     CEFTRIAXONE 32 RESISTANT Resistant     CIPROFLOXACIN <=0.25 SENSITIVE Sensitive     GENTAMICIN <=1 SENSITIVE Sensitive     TRIMETH/SULFA <=20 SENSITIVE Sensitive     * RARE SERRATIA MARCESCENS  Surgical pcr screen     Status: Abnormal   Collection Time: 02/16/23 10:40 PM   Specimen: Nasal Mucosa; Nasal Swab  Result Value Ref Range Status   MRSA, PCR NEGATIVE NEGATIVE Final   Staphylococcus aureus POSITIVE (A) NEGATIVE Final    Comment: (NOTE) The Xpert SA Assay (FDA approved for NASAL specimens in patients 54 years of age and older), is one component of a comprehensive surveillance program. It is not intended to diagnose infection nor to guide or monitor treatment. Performed at [facility], [address] [place], [place] [address]     Labs: CBC: Recent Labs  Lab 02/14/23 1157 02/15/23 0513 02/18/23 0507  WBC 10.8* 8.2 14.5*  HGB 14.4 13.0 12.8*  HCT 43.3 38.7* 38.5*  MCV 81.4 80.5 81.7  PLT 303 257 283   Basic Metabolic Panel: Recent Labs  Lab 02/14/23 1157 02/15/23 0513 02/17/23 0532 02/18/23 0507 02/19/23 0536  NA 133* 138 133* 128*  --   K 3.7 3.5 3.9 4.4  --   CL 95* 104 102 98  --   CO2 27 26 22 23   --  GLUCOSE 194* 206* 211* 410*  --   BUN 26* 22 33* 39*  --   CREATININE 0.96 0.85 0.80 1.00 0.91  CALCIUM 9.1 9.0 9.0 9.0  --   MG  --  2.1  --   --   --   PHOS  --  3.6  --   --   --    Liver Function Tests: No results for input(s): "AST", "ALT", "ALKPHOS", "BILITOT", "PROT", "ALBUMIN" in the last 168 hours. CBG: Recent Labs  Lab 02/18/23 1130 02/18/23 1722 02/18/23 2121 02/19/23 0913 02/19/23 1149  GLUCAP 286* 136* 181* 238* 240*    Discharge time spent: greater than 30 minutes.  This record has been created using Conservation officer, historic buildings. Errors have been sought and corrected,but may not always be located. Such creation errors do not reflect on the standard of care.   Signed: Arnetha Courser, MD [facility] 02/19/2023

## 2023-02-19 NOTE — Progress Notes (Signed)
Date of Admission:  02/14/2023      ID: Manuel [name] is a 77 y.o. 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 history of CABG   Diabetes mellitus type 2, insulin dependent (HCC)   PAD (peripheral artery disease) (HCC)   Alcohol abuse   Chronic combined systolic and diastolic CHF (congestive heart failure) (HCC)   Generalized weakness    Subjective: He did not have a good night last night Did not sleep well and he was woken at 3 am to check his weight  Medications:   ascorbic acid  250 mg Oral BID   aspirin EC  81 mg Oral Daily   atorvastatin  80 mg Oral QHS   carvedilol  25 mg Oral BID WC   Chlorhexidine Gluconate Cloth  6 each Topical Q0600   clopidogrel  75 mg Oral Daily   folic acid  1 mg Oral Daily   gabapentin  800 mg Oral BID   insulin aspart  0-15 Units Subcutaneous TID WC   insulin aspart  6 Units Subcutaneous TID WC   insulin glargine-yfgn  10 Units Subcutaneous BID   iron polysaccharides  150 mg Oral Daily   mirtazapine  15 mg Oral QHS   multivitamin with minerals  1 tablet Oral Daily   mupirocin ointment  1 Application Nasal BID   Ensure Max Protein  11 oz Oral BID   sacubitril-valsartan  1 tablet Oral Q12H   senna-docusate  1 tablet Oral QHS   sodium chloride flush  3 mL Intravenous Q12H   spironolactone  50 mg Oral Daily   tamsulosin  0.4 mg Oral QPC supper   thiamine  100 mg Oral Daily   Or   thiamine  100 mg Intravenous Daily   torsemide  20 mg Oral Daily    Objective: Vital signs in last 24 hours: Patient Vitals for the past 24 hrs:  BP Temp Pulse Resp SpO2 Weight  02/19/23 0351 (!) 142/57 (!) 97.4 F (36.3 C) 73 18 99 % --  02/19/23 0349 -- -- -- -- -- 82.1 kg  02/18/23 1955 (!) 119/59 98 F (36.7 C) 64 18 -- --      PHYSICAL EXAM:  General: Alert, cooperative, no distress, appears stated age.  Lungs: Clear to  auscultation bilaterally. No Wheezing or Rhonchi. No rales. Heart: Regular rate and rhythm, no murmur, rub or gallop. Abdomen: Soft, non-tender,not distended. Bowel sounds normal. No masses Extremities: rt foot dresisng not removed Rt groin- Skin: No rashes or lesions. Or bruising Lymph: Cervical, supraclavicular normal. Neurologic: Grossly non-focal  Lab Results    Latest Ref Rng & Units 02/18/2023    5:07 AM 02/15/2023    5:13 AM 02/14/2023   11:57 AM  CBC  WBC 4.0 - 10.5 K/uL 14.5  8.2  10.8   Hemoglobin 13.0 - 17.0 g/dL 57.8  46.9  62.9   Hematocrit 39.0 - 52.0 % 38.5  38.7  43.3   Platelets 150 - 400 K/uL 283  257  303        Latest Ref Rng & Units 02/19/2023    5:36 AM 02/18/2023    5:07 AM 02/17/2023    5:32 AM  CMP  Glucose 70 - 99 mg/dL  528  413   BUN 8 - 23 mg/dL  39  33   Creatinine 2.44 - 1.24 mg/dL 0.10  1.00  0.80   Sodium 135 - 145 mmol/L  128  133   Potassium 3.5 - 5.1 mmol/L  4.4  3.9   Chloride 98 - 111 mmol/L  98  102   CO2 22 - 32 mmol/L  23  22   Calcium 8.9 - 10.3 mg/dL  9.0  9.0       Microbiology:  Studies/Results: DG Foot Complete Right  Result Date: 02/17/2023 CLINICAL DATA:  Postop. EXAM: RIGHT FOOT COMPLETE - 3+ VIEW COMPARISON:  Preoperative imaging. FINDINGS: Interval transmetatarsal amputation of the first through third rays. Prior transmetatarsal amputation of the fourth and fifth rays, suspect progressive amputation. Expected postsurgical change in the soft tissues. Overlying skin staples in place. IMPRESSION: Transmetatarsal amputation of all 5 rays. Electronically Signed   By: Narda Rutherford M.D.   On: 02/17/2023 21:09   DG MINI C-ARM IMAGE ONLY  Result Date: 02/17/2023 There is no interpretation for this exam.  This order is for images obtained during a surgical procedure.  Please See "Surgeries" Tab for more information regarding the procedure.     Assessment/Plan: ***

## 2023-02-19 NOTE — Progress Notes (Signed)
Daily Progress Note   Subjective  - 2 Days Post-Op  Follow-up transmetatarsal amputation.  Doing well.  No complaints other than he had difficulty sleeping last night.  Patient notes minimal pain to the right foot at this time.  Objective Vitals:   02/18/23 1107 02/18/23 1955 02/19/23 0349 02/19/23 0351  BP: (!) 153/81 (!) 119/59  (!) 142/57  Pulse:  64  73  Resp:  18  18  Temp:  98 F (36.7 C)  (!) 97.4 F (36.3 C)  TempSrc:      SpO2:    99%  Weight:   82.1 kg   Height:        Physical Exam: Incisions well coapted.  No dehiscence.  Staples intact.  Minimal bleeding on the bandage.  Good perfusion of the skin flaps.  Laboratory CBC    Component Value Date/Time   WBC 14.5 (H) 02/18/2023 0507   HGB 12.8 (L) 02/18/2023 0507   HGB 15.7 02/16/2012 2013   HCT 38.5 (L) 02/18/2023 0507   HCT 44.4 02/16/2012 2013   PLT 283 02/18/2023 0507   PLT 176 02/16/2012 2013    BMET    Component Value Date/Time   NA 128 (L) 02/18/2023 0507   NA 137 02/16/2012 2013   K 4.4 02/18/2023 0507   K 3.8 02/16/2012 2013   CL 98 02/18/2023 0507   CL 102 02/16/2012 2013   CO2 23 02/18/2023 0507   CO2 25 02/16/2012 2013   GLUCOSE 410 (H) 02/18/2023 0507   GLUCOSE 154 (H) 02/16/2012 2013   BUN 39 (H) 02/18/2023 0507   BUN 10 02/16/2012 2013   CREATININE 0.91 02/19/2023 0536   CREATININE 0.72 02/16/2012 2013   CALCIUM 9.0 02/18/2023 0507   CALCIUM 9.6 02/16/2012 2013   GFRNONAA >60 02/19/2023 0536   GFRNONAA >60 02/16/2012 2013   GFRAA >60 12/09/2018 0929   GFRAA >60 02/16/2012 2013    Assessment/Planning: Status post transmetatarsal amputation right foot  Dressing applied.  No dehiscence or signs of infection present to the area. Wound culture growing Morganella and corynebacterium. Believe that all infection was removed with amputation.  Pathology report pending.  Appreciate ID recommendations for antibiotic therapy.  Believe that he can go on p.o. antibiotics for 7  days. Patient keep dressing clean, dry, and intact to the right foot and do not change until postoperative visit.  Patient to remain nonweightbearing to the right lower extremity at all times.  Patient to make follow-up visit at least 1 week after discharge.  Podiatry team to sign off at this time.  Rosetta Posner, [name]  02/19/2023, 12:59 PM

## 2023-02-19 NOTE — Care Management Important Message (Signed)
Important Message  Patient Details  Name: Manuel [name] MRN: [id number] Date of Birth: February 21, 1946   Important Message Given:  Yes - Medicare IM     Olegario Messier A Elisabetta Mishra 02/19/2023, 1:45 PM

## 2023-02-19 NOTE — TOC Transition Note (Signed)
Transition of Care [facility]) - CM/SW Discharge Note   Patient Details  Name: Manuel [name] MRN: [id number] Date of Birth: 26-Nov-1945  [facility]) CM/SW Contact:  Manuel Liner, LCSW Phone Number: 02/19/2023, 12:57 PM   Clinical Narrative:  Per MD, patient will discharge home today. [facility] liaison is aware. No further concerns. CSW signing off.   Final next level of care: Home w Home Health Services Barriers to Discharge: Barriers Resolved   Patient Goals and CMS Choice   Choice offered to / list presented to : Patient, Spouse  Discharge Placement                    Name of family member notified: Manuel [name] Patient and family notified of of transfer: 02/19/23  Discharge Plan and Services Additional resources added to the After Visit Summary for       Post Acute Care Choice: Resumption of Svcs/PTA Provider                    HH Arranged: RN, PT [facility] Agency: [facility] Date [facility] Agency Contacted: 02/19/23   Representative spoke with at [facility] Agency: Manuel [name]  Social Determinants of Health (SDOH) Interventions SDOH Screenings   Food Insecurity: No Food Insecurity (02/17/2023)  Housing: Patient Declined (02/17/2023)  Transportation Needs: No Transportation Needs (02/17/2023)  Utilities: Not At Risk (02/17/2023)  Tobacco Use: Low Risk  (02/17/2023)  Recent Concern: Tobacco Use - Medium Risk (01/28/2023)   Received from [facility]     Readmission Risk Interventions    02/18/2023   11:58 AM 02/15/2023    4:10 PM  Readmission Risk Prevention Plan  Transportation Screening  Complete  PCP or Specialist Appt within 3-5 Days Complete Complete  HRI or Home Care Consult Complete Complete  Social Work Consult for Recovery Care Planning/Counseling Complete Complete  Palliative Care Screening Not Applicable Not Applicable  Medication Review Oceanographer) Complete Complete

## 2023-02-20 ENCOUNTER — Telehealth: Payer: Self-pay

## 2023-02-20 ENCOUNTER — Other Ambulatory Visit: Payer: Self-pay

## 2023-02-20 DIAGNOSIS — I6523 Occlusion and stenosis of bilateral carotid arteries: Secondary | ICD-10-CM

## 2023-02-20 NOTE — Telephone Encounter (Signed)
Per Dr. Rivka Safer patient will need labs drawn next week with the antibiotics he is taking.  Orders placed for patient and patient advised he can walk into the lab to have them drawn after his cardiology appt on 02/27/23. Semiyah Newgent Jonathon Resides, CMA

## 2023-03-18 ENCOUNTER — Ambulatory Visit (INDEPENDENT_AMBULATORY_CARE_PROVIDER_SITE_OTHER): Payer: Medicare Other | Admitting: Vascular Surgery

## 2023-04-10 ENCOUNTER — Telehealth (INDEPENDENT_AMBULATORY_CARE_PROVIDER_SITE_OTHER): Payer: Self-pay | Admitting: Vascular Surgery

## 2023-04-10 NOTE — Telephone Encounter (Signed)
Patient called wanting to see Dr. Wyn Quaker urgently. Stated he was just seen by Dr. Excell Seltzer yesterday and Referred to [facility]. States he is in severe pain. Asked if Dr. Excell Seltzer had sent a referral, but before I could offer soonest appointment or look in patient's chart patient said he saw what was going on and that I could not help him then he hung up the phone. Tried to call patient back to advise he go to the ER but no answer.

## 2023-04-11 ENCOUNTER — Telehealth (INDEPENDENT_AMBULATORY_CARE_PROVIDER_SITE_OTHER): Payer: Self-pay

## 2023-04-11 NOTE — Telephone Encounter (Signed)
Patient will need to schedule for abi follow with Wyn Quaker or Vivia Birmingham. I advise patient if the pain increase he should be evaluated at ED or urgent care. Patient did verbalized that his pain has decrease since yesterday. Please contact the patient to schedule appointment.

## 2023-04-15 ENCOUNTER — Emergency Department
Admission: EM | Admit: 2023-04-15 | Discharge: 2023-04-15 | Disposition: A | Discharge status: Home / Self Care | Payer: Medicare Other | Attending: Emergency Medicine | Admitting: Emergency Medicine

## 2023-04-15 ENCOUNTER — Telehealth (INDEPENDENT_AMBULATORY_CARE_PROVIDER_SITE_OTHER): Payer: Self-pay

## 2023-04-15 ENCOUNTER — Other Ambulatory Visit: Payer: Self-pay

## 2023-04-15 ENCOUNTER — Emergency Department: Payer: Medicare Other

## 2023-04-15 DIAGNOSIS — W010XXA Fall on same level from slipping, tripping and stumbling without subsequent striking against object, initial encounter: Secondary | ICD-10-CM | POA: Diagnosis not present

## 2023-04-15 DIAGNOSIS — M25461 Effusion, right knee: Secondary | ICD-10-CM | POA: Diagnosis not present

## 2023-04-15 DIAGNOSIS — M25561 Pain in right knee: Secondary | ICD-10-CM | POA: Diagnosis present

## 2023-04-15 DIAGNOSIS — E119 Type 2 diabetes mellitus without complications: Secondary | ICD-10-CM | POA: Diagnosis not present

## 2023-04-15 DIAGNOSIS — T8140XA Infection following a procedure, unspecified, initial encounter: Secondary | ICD-10-CM | POA: Insufficient documentation

## 2023-04-15 LAB — CBC
HCT: 35.1 % — ABNORMAL LOW (ref 39.0–52.0)
Hemoglobin: 12.1 g/dL — ABNORMAL LOW (ref 13.0–17.0)
MCH: 29.3 pg (ref 26.0–34.0)
MCHC: 34.5 g/dL (ref 30.0–36.0)
MCV: 85 fL (ref 80.0–100.0)
Platelets: 233 10*3/uL (ref 150–400)
RBC: 4.13 MIL/uL — ABNORMAL LOW (ref 4.22–5.81)
RDW: 16.1 % — ABNORMAL HIGH (ref 11.5–15.5)
WBC: 11.6 10*3/uL — ABNORMAL HIGH (ref 4.0–10.5)
nRBC: 0 % (ref 0.0–0.2)

## 2023-04-15 LAB — BASIC METABOLIC PANEL
Anion gap: 13 (ref 5–15)
BUN: 48 mg/dL — ABNORMAL HIGH (ref 8–23)
CO2: 21 mmol/L — ABNORMAL LOW (ref 22–32)
Calcium: 8.8 mg/dL — ABNORMAL LOW (ref 8.9–10.3)
Chloride: 92 mmol/L — ABNORMAL LOW (ref 98–111)
Creatinine, Ser: 1.51 mg/dL — ABNORMAL HIGH (ref 0.61–1.24)
GFR, Estimated: 47 mL/min — ABNORMAL LOW (ref >60)
Glucose, Bld: 215 mg/dL — ABNORMAL HIGH (ref 70–99)
Potassium: 5.1 mmol/L (ref 3.5–5.1)
Sodium: 126 mmol/L — ABNORMAL LOW (ref 135–145)

## 2023-04-15 LAB — LACTIC ACID, PLASMA: Lactic Acid, Venous: 0.8 mmol/L (ref 0.5–1.9)

## 2023-04-15 MED ORDER — CEPHALEXIN 500 MG PO CAPS
500.0000 mg | ORAL_CAPSULE | Freq: Once | ORAL | Status: AC
  Administered 2023-04-15: 500 mg via ORAL
  Filled 2023-04-15: qty 1

## 2023-04-15 MED ORDER — OXYCODONE-ACETAMINOPHEN 5-325 MG PO TABS: 1.0000 | ORAL_TABLET | Freq: Four times a day (QID) | ORAL | 0 refills | Status: DC | PRN

## 2023-04-15 MED ORDER — SULFAMETHOXAZOLE-TRIMETHOPRIM 800-160 MG PO TABS: 1.0000 | ORAL_TABLET | Freq: Two times a day (BID) | ORAL | 0 refills | Status: DC

## 2023-04-15 MED ORDER — OXYCODONE-ACETAMINOPHEN 5-325 MG PO TABS
1.0000 | ORAL_TABLET | Freq: Once | ORAL | Status: AC
Start: 2023-04-15 — End: 2023-04-15
  Administered 2023-04-15: 1 via ORAL
  Filled 2023-04-15: qty 1

## 2023-04-15 MED ORDER — SULFAMETHOXAZOLE-TRIMETHOPRIM 800-160 MG PO TABS
1.0000 | ORAL_TABLET | Freq: Once | ORAL | Status: AC
  Administered 2023-04-15: 1 via ORAL
  Filled 2023-04-15: qty 1

## 2023-04-15 MED ORDER — CEPHALEXIN 500 MG PO CAPS: 500.0000 mg | ORAL_CAPSULE | Freq: Three times a day (TID) | ORAL | 0 refills | Status: DC

## 2023-04-15 MED ORDER — CHLORDIAZEPOXIDE HCL 25 MG PO CAPS
25.0000 mg | ORAL_CAPSULE | Freq: Once | ORAL | Status: AC
  Administered 2023-04-15: 25 mg via ORAL
  Filled 2023-04-15: qty 1

## 2023-04-15 NOTE — ED Notes (Signed)
Pt placed on monitor and hooked up. Family at bedside. Pt states he hasn't had knee xray yet. Orders placed at this time. Pt states pain is in his knee and not his foot or leg.

## 2023-04-15 NOTE — Telephone Encounter (Signed)
I would reach out to his PCP regarding this.  We typically don't handle traumatic incidents

## 2023-04-15 NOTE — ED Triage Notes (Addendum)
Pt comes via EMS from home with right knee and leg pain. Pt fell out of bed Sunday night. Pt states pain is getting worse. Pt had homehealth care come today for his toe amputation and advised pt to come here. Pt has some redness, swelling and weeping noted to leg area. Pt does have wound on right lower leg. Pt is diabetic.   CBG 223

## 2023-04-15 NOTE — Telephone Encounter (Signed)
Harriett Sine called stating the patient fell Sunday injuring his right foot and his knee. His knee is swollen and has been taking his oxycodone to help with the pain. Harriett Sine wants to know is he should get a xray.  Please advise

## 2023-04-15 NOTE — Discharge Instructions (Addendum)
Keep your appointment in 3 days with Podiatry.

## 2023-04-15 NOTE — ED Provider Notes (Signed)
[facility] Provider Note    Event Date/Time   First MD Initiated Contact with Patient 04/15/23 1822     (approximate)   History   Chief Complaint: Knee Pain   HPI  Manuel [name] is a 77 y.o. male with a history of diabetes, gout, sciatica who comes ED complaining of right knee pain after a trip and fall onto his right knee 2 days ago.  Hurts with movement.  Also recently had an amputation of his right foot.  Wound care is coming to his house and rewrapped the surgical site today.  Outside records reviewed noticing February 17, 2019 for right foot TMA by podiatry Dr. Excell Seltzer.          Physical Exam   Triage Vital Signs: ED Triage Vitals  Encounter Vitals Group     BP 04/15/23 1432 121/73     Systolic BP Percentile --      Diastolic BP Percentile --      Pulse Rate 04/15/23 1432 69     Resp 04/15/23 1432 18     Temp 04/15/23 1432 98.3 F (36.8 C)     Temp Source 04/15/23 1843 Oral     SpO2 04/15/23 1432 96 %     Weight 04/15/23 1431 185 lb (83.9 kg)     Height 04/15/23 1431 5\' 8"  (1.727 m)     Head Circumference --      Peak Flow --      Pain Score 04/15/23 1431 9     Pain Loc --      Pain Education --      Exclude from Growth Chart --     Most recent vital signs: Vitals:   04/15/23 1843 04/15/23 1913  BP: 112/78 102/83  Pulse: 85 86  Resp: 17   Temp: 98.1 F (36.7 C)   SpO2: 98%     General: Awake, no distress.  CV:  Good peripheral perfusion.  Regular rate rhythm Resp:  Normal effort.  Clear to auscultation bilaterally Abd:  No distention.  Other:  Tenderness and swelling about the right knee centered at the patellar tendon and quadriceps tendon.  Intact tendon function, patella is nontender. Right foot demonstrates some edema and erythema at at the amputation flap with a small open wound overlying the space between the first and second metatarsal.  No crepitus.  Scant drainage.  No fluctuance   ED Results / Procedures /  Treatments   Labs (all labs ordered are listed, but only abnormal results are displayed) Labs Reviewed  CBC - Abnormal; Notable for the following components:      Result Value   WBC 11.6 (*)    RBC 4.13 (*)    Hemoglobin 12.1 (*)    HCT 35.1 (*)    RDW 16.1 (*)    All other components within normal limits  BASIC METABOLIC PANEL - Abnormal; Notable for the following components:   Sodium 126 (*)    Chloride 92 (*)    CO2 21 (*)    Glucose, Bld 215 (*)    BUN 48 (*)    Creatinine, Ser 1.51 (*)    Calcium 8.8 (*)    GFR, Estimated 47 (*)    All other components within normal limits  LACTIC ACID, PLASMA  LACTIC ACID, PLASMA     EKG    RADIOLOGY X-ray right knee interpreted by me, unremarkable   PROCEDURES:  Procedures   MEDICATIONS ORDERED IN ED: Medications  chlordiazePOXIDE (LIBRIUM) capsule 25 mg (25 mg Oral Given 04/15/23 1953)  oxyCODONE-acetaminophen (PERCOCET/ROXICET) 5-325 MG per tablet 1 tablet (1 tablet Oral Given 04/15/23 1953)  sulfamethoxazole-trimethoprim (BACTRIM DS) 800-160 MG per tablet 1 tablet (1 tablet Oral Given 04/15/23 2048)  cephALEXin (KEFLEX) capsule 500 mg (500 mg Oral Given 04/15/23 2048)     IMPRESSION / MDM / ASSESSMENT AND PLAN / ED COURSE  I reviewed the triage vital signs and the nursing notes.  DDx: Knee fracture, knee effusion, anemia, dehydration, foot cellulitis  Patient's presentation is most consistent with acute presentation with potential threat to life or bodily function.  Patient presents with fall and blunt trauma on the Right knee causing swelling and pain.  X-ray negative for fracture.  Appears to have a traumatic effusion/bursitis.  Doubt tibial plateau fracture or DVT.  Will treat with opioids  Right foot also concerning for developing cellulitis over the amputation site from 2 months ago.  He has follow-up with his podiatrist in 3 days.  Will start on Keflex and Bactrim in the meantime.  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  I think he is suitable for oral antibiotics and outpatient follow-up at this point still        FINAL CLINICAL IMPRESSION(S) / ED DIAGNOSES   Final diagnoses:  Effusion of right knee  Postoperative infection, unspecified type, initial encounter     Rx / DC Orders   ED Discharge Orders          Ordered    oxyCODONE-acetaminophen (PERCOCET) 5-325 MG tablet  Every 6 hours PRN        04/15/23 2035    cephALEXin (KEFLEX) 500 MG capsule  3 times daily        04/15/23 2035    sulfamethoxazole-trimethoprim (BACTRIM DS) 800-160 MG tablet  2 times daily        04/15/23 2035             Note:  This document was prepared using Dragon voice recognition software and may include unintentional dictation errors.   Sharman Cheek, MD 04/15/23 2328

## 2023-04-18 ENCOUNTER — Emergency Department: Payer: Medicare Other

## 2023-04-18 ENCOUNTER — Inpatient Hospital Stay
Admission: EM | Admit: 2023-04-18 | Discharge: 2023-05-08 | DRG: 239 | Disposition: A | Discharge status: Skilled Nursing Facility | Payer: Medicare Other | Attending: Internal Medicine | Admitting: Internal Medicine

## 2023-04-18 ENCOUNTER — Other Ambulatory Visit: Payer: Self-pay

## 2023-04-18 DIAGNOSIS — E1152 Type 2 diabetes mellitus with diabetic peripheral angiopathy with gangrene: Principal | ICD-10-CM | POA: Diagnosis present

## 2023-04-18 DIAGNOSIS — Z66 Do not resuscitate: Secondary | ICD-10-CM

## 2023-04-18 DIAGNOSIS — A419 Sepsis, unspecified organism: Secondary | ICD-10-CM | POA: Diagnosis present

## 2023-04-18 DIAGNOSIS — Z9842 Cataract extraction status, left eye: Secondary | ICD-10-CM

## 2023-04-18 DIAGNOSIS — F419 Anxiety disorder, unspecified: Secondary | ICD-10-CM | POA: Diagnosis present

## 2023-04-18 DIAGNOSIS — Z794 Long term (current) use of insulin: Secondary | ICD-10-CM

## 2023-04-18 DIAGNOSIS — M869 Osteomyelitis, unspecified: Secondary | ICD-10-CM | POA: Diagnosis present

## 2023-04-18 DIAGNOSIS — G9341 Metabolic encephalopathy: Secondary | ICD-10-CM | POA: Diagnosis present

## 2023-04-18 DIAGNOSIS — I5022 Chronic systolic (congestive) heart failure: Secondary | ICD-10-CM | POA: Diagnosis present

## 2023-04-18 DIAGNOSIS — M109 Gout, unspecified: Secondary | ICD-10-CM | POA: Diagnosis present

## 2023-04-18 DIAGNOSIS — N400 Enlarged prostate without lower urinary tract symptoms: Secondary | ICD-10-CM | POA: Diagnosis present

## 2023-04-18 DIAGNOSIS — G4700 Insomnia, unspecified: Secondary | ICD-10-CM | POA: Diagnosis present

## 2023-04-18 DIAGNOSIS — Z79899 Other long term (current) drug therapy: Secondary | ICD-10-CM

## 2023-04-18 DIAGNOSIS — Z961 Presence of intraocular lens: Secondary | ICD-10-CM | POA: Diagnosis present

## 2023-04-18 DIAGNOSIS — N189 Chronic kidney disease, unspecified: Secondary | ICD-10-CM | POA: Diagnosis present

## 2023-04-18 DIAGNOSIS — Z8249 Family history of ischemic heart disease and other diseases of the circulatory system: Secondary | ICD-10-CM

## 2023-04-18 DIAGNOSIS — G4733 Obstructive sleep apnea (adult) (pediatric): Secondary | ICD-10-CM | POA: Diagnosis present

## 2023-04-18 DIAGNOSIS — I998 Other disorder of circulatory system: Secondary | ICD-10-CM | POA: Diagnosis present

## 2023-04-18 DIAGNOSIS — E785 Hyperlipidemia, unspecified: Secondary | ICD-10-CM | POA: Diagnosis present

## 2023-04-18 DIAGNOSIS — E782 Mixed hyperlipidemia: Secondary | ICD-10-CM | POA: Diagnosis present

## 2023-04-18 DIAGNOSIS — F322 Major depressive disorder, single episode, severe without psychotic features: Secondary | ICD-10-CM

## 2023-04-18 DIAGNOSIS — E878 Other disorders of electrolyte and fluid balance, not elsewhere classified: Secondary | ICD-10-CM | POA: Diagnosis present

## 2023-04-18 DIAGNOSIS — E162 Hypoglycemia, unspecified: Secondary | ICD-10-CM | POA: Diagnosis not present

## 2023-04-18 DIAGNOSIS — E861 Hypovolemia: Secondary | ICD-10-CM | POA: Diagnosis present

## 2023-04-18 DIAGNOSIS — N179 Acute kidney failure, unspecified: Secondary | ICD-10-CM | POA: Diagnosis present

## 2023-04-18 DIAGNOSIS — I2510 Atherosclerotic heart disease of native coronary artery without angina pectoris: Secondary | ICD-10-CM

## 2023-04-18 DIAGNOSIS — I5023 Acute on chronic systolic (congestive) heart failure: Secondary | ICD-10-CM | POA: Diagnosis present

## 2023-04-18 DIAGNOSIS — L089 Local infection of the skin and subcutaneous tissue, unspecified: Secondary | ICD-10-CM

## 2023-04-18 DIAGNOSIS — M009 Pyogenic arthritis, unspecified: Secondary | ICD-10-CM

## 2023-04-18 DIAGNOSIS — I110 Hypertensive heart disease with heart failure: Secondary | ICD-10-CM | POA: Diagnosis present

## 2023-04-18 DIAGNOSIS — R45851 Suicidal ideations: Secondary | ICD-10-CM | POA: Diagnosis not present

## 2023-04-18 DIAGNOSIS — Z9841 Cataract extraction status, right eye: Secondary | ICD-10-CM

## 2023-04-18 DIAGNOSIS — G546 Phantom limb syndrome with pain: Secondary | ICD-10-CM | POA: Diagnosis not present

## 2023-04-18 DIAGNOSIS — A480 Gas gangrene: Secondary | ICD-10-CM | POA: Diagnosis present

## 2023-04-18 DIAGNOSIS — E119 Type 2 diabetes mellitus without complications: Secondary | ICD-10-CM

## 2023-04-18 DIAGNOSIS — L97929 Non-pressure chronic ulcer of unspecified part of left lower leg with unspecified severity: Secondary | ICD-10-CM | POA: Diagnosis present

## 2023-04-18 DIAGNOSIS — Z515 Encounter for palliative care: Secondary | ICD-10-CM

## 2023-04-18 DIAGNOSIS — W06XXXA Fall from bed, initial encounter: Secondary | ICD-10-CM | POA: Diagnosis present

## 2023-04-18 DIAGNOSIS — Z9862 Peripheral vascular angioplasty status: Secondary | ICD-10-CM

## 2023-04-18 DIAGNOSIS — Z7902 Long term (current) use of antithrombotics/antiplatelets: Secondary | ICD-10-CM

## 2023-04-18 DIAGNOSIS — E1142 Type 2 diabetes mellitus with diabetic polyneuropathy: Secondary | ICD-10-CM | POA: Diagnosis present

## 2023-04-18 DIAGNOSIS — I5042 Chronic combined systolic (congestive) and diastolic (congestive) heart failure: Secondary | ICD-10-CM | POA: Diagnosis present

## 2023-04-18 DIAGNOSIS — E1169 Type 2 diabetes mellitus with other specified complication: Secondary | ICD-10-CM | POA: Diagnosis present

## 2023-04-18 DIAGNOSIS — D62 Acute posthemorrhagic anemia: Secondary | ICD-10-CM | POA: Diagnosis not present

## 2023-04-18 DIAGNOSIS — L03115 Cellulitis of right lower limb: Secondary | ICD-10-CM | POA: Diagnosis present

## 2023-04-18 DIAGNOSIS — Z89511 Acquired absence of right leg below knee: Secondary | ICD-10-CM

## 2023-04-18 DIAGNOSIS — E11621 Type 2 diabetes mellitus with foot ulcer: Secondary | ICD-10-CM | POA: Diagnosis present

## 2023-04-18 DIAGNOSIS — E11628 Type 2 diabetes mellitus with other skin complications: Secondary | ICD-10-CM

## 2023-04-18 DIAGNOSIS — K5900 Constipation, unspecified: Secondary | ICD-10-CM | POA: Diagnosis not present

## 2023-04-18 DIAGNOSIS — I252 Old myocardial infarction: Secondary | ICD-10-CM

## 2023-04-18 DIAGNOSIS — E871 Hypo-osmolality and hyponatremia: Secondary | ICD-10-CM | POA: Diagnosis not present

## 2023-04-18 DIAGNOSIS — E1165 Type 2 diabetes mellitus with hyperglycemia: Secondary | ICD-10-CM | POA: Diagnosis present

## 2023-04-18 DIAGNOSIS — Z7982 Long term (current) use of aspirin: Secondary | ICD-10-CM

## 2023-04-18 DIAGNOSIS — I10 Essential (primary) hypertension: Secondary | ICD-10-CM | POA: Diagnosis present

## 2023-04-18 DIAGNOSIS — G8929 Other chronic pain: Secondary | ICD-10-CM | POA: Diagnosis present

## 2023-04-18 DIAGNOSIS — Z955 Presence of coronary angioplasty implant and graft: Secondary | ICD-10-CM

## 2023-04-18 DIAGNOSIS — Z1623 Resistance to quinolones and fluoroquinolones: Secondary | ICD-10-CM | POA: Diagnosis present

## 2023-04-18 DIAGNOSIS — I255 Ischemic cardiomyopathy: Secondary | ICD-10-CM | POA: Diagnosis present

## 2023-04-18 LAB — CBC
HCT: 34 % — ABNORMAL LOW (ref 39.0–52.0)
Hemoglobin: 11.7 g/dL — ABNORMAL LOW (ref 13.0–17.0)
MCH: 28.7 pg (ref 26.0–34.0)
MCHC: 34.4 g/dL (ref 30.0–36.0)
MCV: 83.5 fL (ref 80.0–100.0)
Platelets: 296 10*3/uL (ref 150–400)
RBC: 4.07 MIL/uL — ABNORMAL LOW (ref 4.22–5.81)
RDW: 16.3 % — ABNORMAL HIGH (ref 11.5–15.5)
WBC: 16.5 10*3/uL — ABNORMAL HIGH (ref 4.0–10.5)
nRBC: 0 % (ref 0.0–0.2)

## 2023-04-18 LAB — COMPREHENSIVE METABOLIC PANEL
ALT: 48 U/L — ABNORMAL HIGH (ref 0–44)
AST: 55 U/L — ABNORMAL HIGH (ref 15–41)
Albumin: 3.2 g/dL — ABNORMAL LOW (ref 3.5–5.0)
Alkaline Phosphatase: 112 U/L (ref 38–126)
Anion gap: 12 (ref 5–15)
BUN: 71 mg/dL — ABNORMAL HIGH (ref 8–23)
CO2: 21 mmol/L — ABNORMAL LOW (ref 22–32)
Calcium: 8.7 mg/dL — ABNORMAL LOW (ref 8.9–10.3)
Chloride: 90 mmol/L — ABNORMAL LOW (ref 98–111)
Creatinine, Ser: 1.98 mg/dL — ABNORMAL HIGH (ref 0.61–1.24)
GFR, Estimated: 34 mL/min — ABNORMAL LOW (ref >60)
Glucose, Bld: 249 mg/dL — ABNORMAL HIGH (ref 70–99)
Potassium: 5 mmol/L (ref 3.5–5.1)
Sodium: 123 mmol/L — ABNORMAL LOW (ref 135–145)
Total Bilirubin: 0.6 mg/dL (ref <1.2)
Total Protein: 7.2 g/dL (ref 6.5–8.1)

## 2023-04-18 LAB — URINALYSIS, ROUTINE W REFLEX MICROSCOPIC
Bacteria, UA: NONE SEEN
Bilirubin Urine: NEGATIVE
Glucose, UA: >=500 mg/dL — AB
Ketones, ur: NEGATIVE mg/dL
Leukocytes,Ua: NEGATIVE
Nitrite: NEGATIVE
Protein, ur: NEGATIVE mg/dL
RBC / HPF: 0 RBC/hpf (ref 0–5)
Specific Gravity, Urine: 1.005 (ref 1.005–1.030)
pH: 5 (ref 5.0–8.0)

## 2023-04-18 LAB — LACTIC ACID, PLASMA: Lactic Acid, Venous: 1.1 mmol/L (ref 0.5–1.9)

## 2023-04-18 NOTE — Telephone Encounter (Signed)
Patient is currently in the hospital.

## 2023-04-18 NOTE — Telephone Encounter (Signed)
Ok, they will consult [place] if necessary

## 2023-04-18 NOTE — ED Triage Notes (Signed)
Pt arrives via ems from home where he lives with his wife, pt had a fall out of bed Sunday and was evaluated after the fall, pt returns today with swelling to the right leg, leg is tight all the way up his thigh, pt is post op removal of toes from the right foot, pt has an ulcer to his right shin and abrasions to his bilat knees and elbow. Pt states that he is confused more than normal and states that the pain in his right foot has been worse

## 2023-04-19 ENCOUNTER — Encounter: Payer: Self-pay | Admitting: Family Medicine

## 2023-04-19 ENCOUNTER — Inpatient Hospital Stay: Payer: Medicare Other

## 2023-04-19 ENCOUNTER — Other Ambulatory Visit: Payer: Self-pay

## 2023-04-19 DIAGNOSIS — L089 Local infection of the skin and subcutaneous tissue, unspecified: Secondary | ICD-10-CM | POA: Diagnosis not present

## 2023-04-19 DIAGNOSIS — A480 Gas gangrene: Secondary | ICD-10-CM | POA: Diagnosis present

## 2023-04-19 DIAGNOSIS — Z66 Do not resuscitate: Secondary | ICD-10-CM | POA: Diagnosis present

## 2023-04-19 DIAGNOSIS — T148XXA Other injury of unspecified body region, initial encounter: Secondary | ICD-10-CM

## 2023-04-19 DIAGNOSIS — A419 Sepsis, unspecified organism: Secondary | ICD-10-CM | POA: Diagnosis not present

## 2023-04-19 DIAGNOSIS — W06XXXA Fall from bed, initial encounter: Secondary | ICD-10-CM | POA: Diagnosis present

## 2023-04-19 DIAGNOSIS — L0390 Cellulitis, unspecified: Secondary | ICD-10-CM | POA: Diagnosis not present

## 2023-04-19 DIAGNOSIS — M00061 Staphylococcal arthritis, right knee: Secondary | ICD-10-CM | POA: Diagnosis not present

## 2023-04-19 DIAGNOSIS — L97929 Non-pressure chronic ulcer of unspecified part of left lower leg with unspecified severity: Secondary | ICD-10-CM | POA: Diagnosis present

## 2023-04-19 DIAGNOSIS — I998 Other disorder of circulatory system: Secondary | ICD-10-CM | POA: Diagnosis not present

## 2023-04-19 DIAGNOSIS — M00861 Arthritis due to other bacteria, right knee: Secondary | ICD-10-CM | POA: Diagnosis not present

## 2023-04-19 DIAGNOSIS — Z89611 Acquired absence of right leg above knee: Secondary | ICD-10-CM | POA: Diagnosis not present

## 2023-04-19 DIAGNOSIS — E11621 Type 2 diabetes mellitus with foot ulcer: Secondary | ICD-10-CM | POA: Diagnosis present

## 2023-04-19 DIAGNOSIS — F322 Major depressive disorder, single episode, severe without psychotic features: Secondary | ICD-10-CM | POA: Diagnosis present

## 2023-04-19 DIAGNOSIS — I10 Essential (primary) hypertension: Secondary | ICD-10-CM | POA: Diagnosis present

## 2023-04-19 DIAGNOSIS — E119 Type 2 diabetes mellitus without complications: Secondary | ICD-10-CM | POA: Diagnosis not present

## 2023-04-19 DIAGNOSIS — E871 Hypo-osmolality and hyponatremia: Secondary | ICD-10-CM | POA: Diagnosis present

## 2023-04-19 DIAGNOSIS — G4700 Insomnia, unspecified: Secondary | ICD-10-CM | POA: Diagnosis present

## 2023-04-19 DIAGNOSIS — T8743 Infection of amputation stump, right lower extremity: Secondary | ICD-10-CM | POA: Diagnosis not present

## 2023-04-19 DIAGNOSIS — I110 Hypertensive heart disease with heart failure: Secondary | ICD-10-CM | POA: Diagnosis present

## 2023-04-19 DIAGNOSIS — M869 Osteomyelitis, unspecified: Secondary | ICD-10-CM | POA: Diagnosis present

## 2023-04-19 DIAGNOSIS — Z1623 Resistance to quinolones and fluoroquinolones: Secondary | ICD-10-CM | POA: Diagnosis present

## 2023-04-19 DIAGNOSIS — Z794 Long term (current) use of insulin: Secondary | ICD-10-CM | POA: Insufficient documentation

## 2023-04-19 DIAGNOSIS — M25561 Pain in right knee: Secondary | ICD-10-CM | POA: Diagnosis not present

## 2023-04-19 DIAGNOSIS — R45851 Suicidal ideations: Secondary | ICD-10-CM | POA: Diagnosis not present

## 2023-04-19 DIAGNOSIS — D62 Acute posthemorrhagic anemia: Secondary | ICD-10-CM | POA: Diagnosis not present

## 2023-04-19 DIAGNOSIS — Z89511 Acquired absence of right leg below knee: Secondary | ICD-10-CM | POA: Diagnosis not present

## 2023-04-19 DIAGNOSIS — L03115 Cellulitis of right lower limb: Secondary | ICD-10-CM | POA: Diagnosis present

## 2023-04-19 DIAGNOSIS — E1142 Type 2 diabetes mellitus with diabetic polyneuropathy: Secondary | ICD-10-CM | POA: Diagnosis present

## 2023-04-19 DIAGNOSIS — M8600 Acute hematogenous osteomyelitis, unspecified site: Secondary | ICD-10-CM | POA: Diagnosis not present

## 2023-04-19 DIAGNOSIS — E11628 Type 2 diabetes mellitus with other skin complications: Secondary | ICD-10-CM | POA: Diagnosis not present

## 2023-04-19 DIAGNOSIS — M86071 Acute hematogenous osteomyelitis, right ankle and foot: Secondary | ICD-10-CM | POA: Diagnosis not present

## 2023-04-19 DIAGNOSIS — F419 Anxiety disorder, unspecified: Secondary | ICD-10-CM | POA: Diagnosis present

## 2023-04-19 DIAGNOSIS — I999 Unspecified disorder of circulatory system: Secondary | ICD-10-CM | POA: Diagnosis not present

## 2023-04-19 DIAGNOSIS — Z515 Encounter for palliative care: Secondary | ICD-10-CM | POA: Diagnosis not present

## 2023-04-19 DIAGNOSIS — E1152 Type 2 diabetes mellitus with diabetic peripheral angiopathy with gangrene: Secondary | ICD-10-CM | POA: Diagnosis present

## 2023-04-19 DIAGNOSIS — I5022 Chronic systolic (congestive) heart failure: Secondary | ICD-10-CM | POA: Diagnosis present

## 2023-04-19 DIAGNOSIS — M86261 Subacute osteomyelitis, right tibia and fibula: Secondary | ICD-10-CM | POA: Diagnosis not present

## 2023-04-19 DIAGNOSIS — E1165 Type 2 diabetes mellitus with hyperglycemia: Secondary | ICD-10-CM | POA: Diagnosis present

## 2023-04-19 DIAGNOSIS — I5042 Chronic combined systolic (congestive) and diastolic (congestive) heart failure: Secondary | ICD-10-CM | POA: Diagnosis not present

## 2023-04-19 DIAGNOSIS — E785 Hyperlipidemia, unspecified: Secondary | ICD-10-CM | POA: Insufficient documentation

## 2023-04-19 DIAGNOSIS — M009 Pyogenic arthritis, unspecified: Secondary | ICD-10-CM | POA: Diagnosis present

## 2023-04-19 DIAGNOSIS — E782 Mixed hyperlipidemia: Secondary | ICD-10-CM | POA: Diagnosis present

## 2023-04-19 DIAGNOSIS — B9561 Methicillin susceptible Staphylococcus aureus infection as the cause of diseases classified elsewhere: Secondary | ICD-10-CM | POA: Diagnosis not present

## 2023-04-19 DIAGNOSIS — I2510 Atherosclerotic heart disease of native coronary artery without angina pectoris: Secondary | ICD-10-CM | POA: Diagnosis not present

## 2023-04-19 DIAGNOSIS — G9341 Metabolic encephalopathy: Secondary | ICD-10-CM | POA: Diagnosis present

## 2023-04-19 DIAGNOSIS — N189 Chronic kidney disease, unspecified: Secondary | ICD-10-CM | POA: Diagnosis present

## 2023-04-19 DIAGNOSIS — N179 Acute kidney failure, unspecified: Secondary | ICD-10-CM | POA: Diagnosis present

## 2023-04-19 DIAGNOSIS — E1169 Type 2 diabetes mellitus with other specified complication: Secondary | ICD-10-CM | POA: Diagnosis present

## 2023-04-19 DIAGNOSIS — N400 Enlarged prostate without lower urinary tract symptoms: Secondary | ICD-10-CM | POA: Diagnosis present

## 2023-04-19 LAB — NA AND K (SODIUM & POTASSIUM), RAND UR
Potassium Urine: 13 mmol/L
Sodium, Ur: 18 mmol/L

## 2023-04-19 LAB — OSMOLALITY: Osmolality: 295 mosm/kg (ref 275–295)

## 2023-04-19 LAB — BASIC METABOLIC PANEL
Anion gap: 10 (ref 5–15)
Anion gap: 12 (ref 5–15)
BUN: 67 mg/dL — ABNORMAL HIGH (ref 8–23)
BUN: 61 mg/dL — ABNORMAL HIGH (ref 8–23)
CO2: 18 mmol/L — ABNORMAL LOW (ref 22–32)
CO2: 16 mmol/L — ABNORMAL LOW (ref 22–32)
Calcium: 8.1 mg/dL — ABNORMAL LOW (ref 8.9–10.3)
Calcium: 8.3 mg/dL — ABNORMAL LOW (ref 8.9–10.3)
Chloride: 98 mmol/L (ref 98–111)
Chloride: 101 mmol/L (ref 98–111)
Creatinine, Ser: 1.71 mg/dL — ABNORMAL HIGH (ref 0.61–1.24)
Creatinine, Ser: 1.52 mg/dL — ABNORMAL HIGH (ref 0.61–1.24)
GFR, Estimated: 40 mL/min — ABNORMAL LOW (ref >60)
GFR, Estimated: 46 mL/min — ABNORMAL LOW (ref >60)
Glucose, Bld: 190 mg/dL — ABNORMAL HIGH (ref 70–99)
Glucose, Bld: 139 mg/dL — ABNORMAL HIGH (ref 70–99)
Potassium: 4.8 mmol/L (ref 3.5–5.1)
Potassium: 4.8 mmol/L (ref 3.5–5.1)
Sodium: 126 mmol/L — ABNORMAL LOW (ref 135–145)
Sodium: 129 mmol/L — ABNORMAL LOW (ref 135–145)

## 2023-04-19 LAB — CBC
HCT: 30.1 % — ABNORMAL LOW (ref 39.0–52.0)
Hemoglobin: 10.3 g/dL — ABNORMAL LOW (ref 13.0–17.0)
MCH: 28.5 pg (ref 26.0–34.0)
MCHC: 34.2 g/dL (ref 30.0–36.0)
MCV: 83.1 fL (ref 80.0–100.0)
Platelets: 293 10*3/uL (ref 150–400)
RBC: 3.62 MIL/uL — ABNORMAL LOW (ref 4.22–5.81)
RDW: 16.5 % — ABNORMAL HIGH (ref 11.5–15.5)
WBC: 15.6 10*3/uL — ABNORMAL HIGH (ref 4.0–10.5)
nRBC: 0 % (ref 0.0–0.2)

## 2023-04-19 LAB — CORTISOL-AM, BLOOD: Cortisol - AM: 13.1 ug/dL (ref 6.7–22.6)

## 2023-04-19 LAB — TYPE AND SCREEN
ABO/RH(D): A POS
Antibody Screen: NEGATIVE

## 2023-04-19 LAB — PROTIME-INR
INR: 1.4 — ABNORMAL HIGH (ref 0.8–1.2)
Prothrombin Time: 17.4 s — ABNORMAL HIGH (ref 11.4–15.2)

## 2023-04-19 LAB — GLUCOSE, CAPILLARY: Glucose-Capillary: 153 mg/dL — ABNORMAL HIGH (ref 70–99)

## 2023-04-19 LAB — CBG MONITORING, ED
Glucose-Capillary: 152 mg/dL — ABNORMAL HIGH (ref 70–99)
Glucose-Capillary: 157 mg/dL — ABNORMAL HIGH (ref 70–99)
Glucose-Capillary: 144 mg/dL — ABNORMAL HIGH (ref 70–99)

## 2023-04-19 LAB — TSH: TSH: 1.416 u[IU]/mL (ref 0.350–4.500)

## 2023-04-19 LAB — OSMOLALITY, URINE: Osmolality, Ur: 201 mosm/kg — ABNORMAL LOW (ref 300–900)

## 2023-04-19 LAB — PROCALCITONIN: Procalcitonin: 2.13 ng/mL

## 2023-04-19 MED ORDER — TAMSULOSIN HCL 0.4 MG PO CAPS
0.4000 mg | ORAL_CAPSULE | Freq: Every day | ORAL | Status: DC
  Administered 2023-04-19 – 2023-05-07 (×19): 0.4 mg via ORAL
  Filled 2023-04-19 (×19): qty 1

## 2023-04-19 MED ORDER — MIRTAZAPINE 15 MG PO TABS
15.0000 mg | ORAL_TABLET | Freq: Every day | ORAL | Status: DC
  Administered 2023-04-19 – 2023-04-27 (×9): 15 mg via ORAL
  Filled 2023-04-19 (×10): qty 1

## 2023-04-19 MED ORDER — ENSURE MAX PROTEIN PO LIQD
11.0000 [oz_av] | Freq: Every day | ORAL | Status: DC
  Administered 2023-04-20 – 2023-05-07 (×12): 11 [oz_av] via ORAL
  Filled 2023-04-19: qty 330

## 2023-04-19 MED ORDER — LACTATED RINGERS IV SOLN: 150.0000 mL/h | INTRAVENOUS | Status: DC

## 2023-04-19 MED ORDER — ACETAMINOPHEN 325 MG PO TABS
650.0000 mg | ORAL_TABLET | Freq: Four times a day (QID) | ORAL | Status: DC | PRN
  Administered 2023-04-24 – 2023-04-29 (×2): 650 mg via ORAL
  Filled 2023-04-19 (×3): qty 2

## 2023-04-19 MED ORDER — CEFTRIAXONE SODIUM 2 G IJ SOLR
2.0000 g | INTRAMUSCULAR | Status: AC
  Administered 2023-04-19 – 2023-04-25 (×7): 2 g via INTRAVENOUS
  Filled 2023-04-19 (×7): qty 20

## 2023-04-19 MED ORDER — ATORVASTATIN CALCIUM 80 MG PO TABS
80.0000 mg | ORAL_TABLET | Freq: Every day | ORAL | Status: DC
  Administered 2023-04-19 – 2023-05-07 (×19): 80 mg via ORAL
  Filled 2023-04-19 (×7): qty 1
  Filled 2023-04-19: qty 4
  Filled 2023-04-19 (×12): qty 1

## 2023-04-19 MED ORDER — INSULIN ASPART 100 UNIT/ML IJ SOLN
0.0000 [IU] | Freq: Three times a day (TID) | INTRAMUSCULAR | Status: DC
  Administered 2023-04-19: 3 [IU] via SUBCUTANEOUS
  Administered 2023-04-19 (×2): 4 [IU] via SUBCUTANEOUS
  Administered 2023-04-20: 11 [IU] via SUBCUTANEOUS
  Administered 2023-04-21 (×2): 4 [IU] via SUBCUTANEOUS
  Administered 2023-04-22 (×2): 3 [IU] via SUBCUTANEOUS
  Administered 2023-04-22: 7 [IU] via SUBCUTANEOUS
  Administered 2023-04-23: 3 [IU] via SUBCUTANEOUS
  Administered 2023-04-23: 4 [IU] via SUBCUTANEOUS
  Administered 2023-04-24 (×2): 3 [IU] via SUBCUTANEOUS
  Administered 2023-04-25 – 2023-04-27 (×3): 4 [IU] via SUBCUTANEOUS
  Administered 2023-04-27 – 2023-04-28 (×3): 3 [IU] via SUBCUTANEOUS
  Administered 2023-04-29 – 2023-04-30 (×2): 4 [IU] via SUBCUTANEOUS
  Administered 2023-04-30: 3 [IU] via SUBCUTANEOUS
  Administered 2023-05-04 – 2023-05-05 (×2): 7 [IU] via SUBCUTANEOUS
  Administered 2023-05-06 (×2): 4 [IU] via SUBCUTANEOUS
  Administered 2023-05-07: 3 [IU] via SUBCUTANEOUS
  Administered 2023-05-08: 4 [IU] via SUBCUTANEOUS
  Filled 2023-04-19 (×34): qty 1

## 2023-04-19 MED ORDER — INSULIN ASPART 100 UNIT/ML IJ SOLN
0.0000 [IU] | Freq: Every day | INTRAMUSCULAR | Status: DC
  Administered 2023-04-20 – 2023-04-29 (×2): 2 [IU] via SUBCUTANEOUS
  Filled 2023-04-19 (×2): qty 1

## 2023-04-19 MED ORDER — PREGABALIN 75 MG PO CAPS
75.0000 mg | ORAL_CAPSULE | Freq: Two times a day (BID) | ORAL | Status: DC
  Administered 2023-04-19 – 2023-05-03 (×29): 75 mg via ORAL
  Filled 2023-04-19 (×30): qty 1

## 2023-04-19 MED ORDER — TRAZODONE HCL 50 MG PO TABS
25.0000 mg | ORAL_TABLET | Freq: Every evening | ORAL | Status: DC | PRN
  Administered 2023-04-25 – 2023-04-27 (×3): 25 mg via ORAL
  Filled 2023-04-19 (×4): qty 1

## 2023-04-19 MED ORDER — VANCOMYCIN HCL IN DEXTROSE 1-5 GM/200ML-% IV SOLN: 1000.0000 mg | Freq: Once | INTRAVENOUS | Status: DC

## 2023-04-19 MED ORDER — COLCHICINE 0.6 MG PO TABS
0.6000 mg | ORAL_TABLET | Freq: Two times a day (BID) | ORAL | Status: DC
  Administered 2023-04-19 – 2023-04-24 (×10): 0.6 mg via ORAL
  Filled 2023-04-19 (×12): qty 1

## 2023-04-19 MED ORDER — INSULIN ASPART PROT & ASPART (70-30 MIX) 100 UNIT/ML ~~LOC~~ SUSP
30.0000 [IU] | Freq: Two times a day (BID) | SUBCUTANEOUS | Status: DC
  Administered 2023-04-19 – 2023-04-26 (×9): 30 [IU] via SUBCUTANEOUS
  Filled 2023-04-19 (×2): qty 10

## 2023-04-19 MED ORDER — THIAMINE MONONITRATE 100 MG PO TABS
100.0000 mg | ORAL_TABLET | Freq: Every day | ORAL | Status: DC
  Administered 2023-04-19 – 2023-05-08 (×19): 100 mg via ORAL
  Filled 2023-04-19 (×19): qty 1

## 2023-04-19 MED ORDER — ASPIRIN 81 MG PO TBEC
81.0000 mg | DELAYED_RELEASE_TABLET | Freq: Every day | ORAL | Status: DC
  Administered 2023-04-19 – 2023-05-08 (×19): 81 mg via ORAL
  Filled 2023-04-19 (×19): qty 1

## 2023-04-19 MED ORDER — SODIUM CHLORIDE 0.9 % IV SOLN
2.0000 g | Freq: Once | INTRAVENOUS | Status: AC
  Administered 2023-04-19: 2 g via INTRAVENOUS
  Filled 2023-04-19: qty 12.5

## 2023-04-19 MED ORDER — DAPAGLIFLOZIN PROPANEDIOL 10 MG PO TABS
10.0000 mg | ORAL_TABLET | Freq: Every day | ORAL | Status: DC
  Administered 2023-04-19 – 2023-05-08 (×18): 10 mg via ORAL
  Filled 2023-04-19 (×20): qty 1

## 2023-04-19 MED ORDER — CLOPIDOGREL BISULFATE 75 MG PO TABS
75.0000 mg | ORAL_TABLET | Freq: Every day | ORAL | Status: DC
  Administered 2023-04-19 – 2023-05-08 (×19): 75 mg via ORAL
  Filled 2023-04-19 (×19): qty 1

## 2023-04-19 MED ORDER — FOLIC ACID 1 MG PO TABS
1.0000 mg | ORAL_TABLET | Freq: Every day | ORAL | Status: DC
  Administered 2023-04-19 – 2023-05-08 (×19): 1 mg via ORAL
  Filled 2023-04-19 (×19): qty 1

## 2023-04-19 MED ORDER — MORPHINE SULFATE (PF) 4 MG/ML IV SOLN
4.0000 mg | INTRAVENOUS | Status: DC | PRN
  Administered 2023-04-19 – 2023-04-23 (×10): 4 mg via INTRAVENOUS
  Filled 2023-04-19 (×11): qty 1

## 2023-04-19 MED ORDER — COLCHICINE 0.6 MG PO TABS
0.6000 mg | ORAL_TABLET | ORAL | Status: AC
  Administered 2023-04-19 (×2): 0.6 mg via ORAL
  Filled 2023-04-19 (×2): qty 1

## 2023-04-19 MED ORDER — ENOXAPARIN SODIUM 40 MG/0.4ML IJ SOSY
40.0000 mg | PREFILLED_SYRINGE | INTRAMUSCULAR | Status: DC
  Administered 2023-04-19 – 2023-05-08 (×20): 40 mg via SUBCUTANEOUS
  Filled 2023-04-19 (×20): qty 0.4

## 2023-04-19 MED ORDER — OXYCODONE-ACETAMINOPHEN 5-325 MG PO TABS
1.0000 | ORAL_TABLET | Freq: Four times a day (QID) | ORAL | Status: DC | PRN
  Administered 2023-04-19 – 2023-04-30 (×22): 1 via ORAL
  Filled 2023-04-19 (×24): qty 1

## 2023-04-19 MED ORDER — SENNOSIDES-DOCUSATE SODIUM 8.6-50 MG PO TABS
1.0000 | ORAL_TABLET | Freq: Every evening | ORAL | Status: DC | PRN
  Administered 2023-04-22 – 2023-05-03 (×3): 1 via ORAL
  Filled 2023-04-19 (×4): qty 1

## 2023-04-19 MED ORDER — ONDANSETRON HCL 4 MG PO TABS: 4.0000 mg | ORAL_TABLET | Freq: Four times a day (QID) | ORAL | Status: DC | PRN

## 2023-04-19 MED ORDER — CARVEDILOL 25 MG PO TABS
25.0000 mg | ORAL_TABLET | Freq: Two times a day (BID) | ORAL | Status: DC
  Administered 2023-04-19 – 2023-05-08 (×36): 25 mg via ORAL
  Filled 2023-04-19 (×37): qty 1

## 2023-04-19 MED ORDER — ACETAMINOPHEN 650 MG RE SUPP: 650.0000 mg | Freq: Four times a day (QID) | RECTAL | Status: DC | PRN

## 2023-04-19 MED ORDER — SODIUM CHLORIDE 0.9 % IV SOLN
1500.0000 mg | Freq: Once | INTRAVENOUS | Status: AC
  Administered 2023-04-19: 1500 mg via INTRAVENOUS
  Filled 2023-04-19: qty 30

## 2023-04-19 MED ORDER — OXYCODONE HCL 5 MG PO TABS: 5.0000 mg | ORAL_TABLET | ORAL | Status: DC | PRN

## 2023-04-19 MED ORDER — COLCHICINE 0.6 MG PO TABS: 0.6000 mg | ORAL_TABLET | Freq: Two times a day (BID) | ORAL | Status: DC

## 2023-04-19 MED ORDER — VANCOMYCIN HCL IN DEXTROSE 1-5 GM/200ML-% IV SOLN
1000.0000 mg | INTRAVENOUS | Status: DC
  Filled 2023-04-19: qty 200

## 2023-04-19 MED ORDER — ADULT MULTIVITAMIN W/MINERALS CH
1.0000 | ORAL_TABLET | Freq: Every day | ORAL | Status: DC
  Administered 2023-04-19 – 2023-05-08 (×19): 1 via ORAL
  Filled 2023-04-19 (×19): qty 1

## 2023-04-19 MED ORDER — VANCOMYCIN HCL 750 MG/150ML IV SOLN
750.0000 mg | INTRAVENOUS | Status: DC
  Administered 2023-04-19: 750 mg via INTRAVENOUS
  Filled 2023-04-19: qty 150

## 2023-04-19 MED ORDER — ONDANSETRON HCL 4 MG/2ML IJ SOLN: 4.0000 mg | Freq: Four times a day (QID) | INTRAMUSCULAR | Status: DC | PRN

## 2023-04-19 MED ORDER — HYDROMORPHONE HCL 1 MG/ML IJ SOLN
1.0000 mg | Freq: Once | INTRAMUSCULAR | Status: AC
  Administered 2023-04-19: 1 mg via INTRAVENOUS
  Filled 2023-04-19: qty 1

## 2023-04-19 MED ORDER — SACUBITRIL-VALSARTAN 24-26 MG PO TABS
1.0000 | ORAL_TABLET | Freq: Two times a day (BID) | ORAL | Status: DC
  Filled 2023-04-19: qty 1

## 2023-04-19 MED ORDER — HYDRALAZINE HCL 25 MG PO TABS: 25.0000 mg | ORAL_TABLET | Freq: Four times a day (QID) | ORAL | Status: DC | PRN

## 2023-04-19 MED ORDER — SODIUM CHLORIDE 0.9 % IV BOLUS
1000.0000 mL | Freq: Once | INTRAVENOUS | Status: AC
  Administered 2023-04-19: 1000 mL via INTRAVENOUS

## 2023-04-19 MED ORDER — SODIUM CHLORIDE 0.9 % IV SOLN: INTRAVENOUS | Status: AC

## 2023-04-19 MED ORDER — INSULIN ASPART 100 UNIT/ML IJ SOLN: 3.0000 [IU] | Freq: Three times a day (TID) | INTRAMUSCULAR | Status: DC

## 2023-04-19 MED ORDER — POLYSACCHARIDE IRON COMPLEX 150 MG PO CAPS
150.0000 mg | ORAL_CAPSULE | Freq: Every day | ORAL | Status: DC
  Administered 2023-04-19 – 2023-05-08 (×20): 150 mg via ORAL
  Filled 2023-04-19 (×20): qty 1

## 2023-04-19 NOTE — ED Notes (Signed)
Patient c/o severe pain to right lower leg (wife believes redness may be worse than previously). Patient moaning in pain during conversation. Also c/o sore throat. Dr. David Stall notified.

## 2023-04-19 NOTE — Progress Notes (Signed)
Pharmacy Antibiotic Note  Manuel [name] is a 77 y.o. male w/ PMH of OA, CAD, HTN, DDD, DM, gout, HLD, OSA, peripheral neuropathy admitted on 04/18/2023 with cellulitis.  Pharmacy has been consulted for vancomycin dosing. Serum creatinine is elevated but improving since admission  Plan: adjust vancomycin dose to 1000 mg IV Q 24 hrs. (next dose 12/29 am) Goal AUC 400-550. Expected AUC: 505.7 SCr used: 1.71 mg/dL Ke 8.295 h-1, A2/1 30.8M Will order a random vancomycin level in am to assess clearance  Pharmacy will continue to follow and will adjust abx dosing whenever warranted.  Temp (24hrs), Avg:98.9 F (37.2 C), Min:98.2 F (36.8 C), Max:99.7 F (37.6 C)   Recent Labs  Lab 04/15/23 1432 04/18/23 1758 04/18/23 1759 04/19/23 0442  WBC 11.6*  --  16.5* 15.6*  CREATININE 1.51*  --  1.98* 1.71*  LATICACIDVEN 0.8 1.1  --   --     Estimated Creatinine Clearance: 37 mL/min (A) (by C-G formula based on SCr of 1.71 mg/dL (H)).    No Known Allergies  Antimicrobials this admission: 12/28 cefepime >> x 1 dose 12/28 vancomycin >>  12/28 ceftriaxone >>   Microbiology results: 12/28 BCx: NGTD 12/28 WCx: pending  Thank you for allowing pharmacy to be a part of this patient's care.  Burnis Medin, PharmD, BCPS 04/19/2023 2:32 PM

## 2023-04-19 NOTE — Assessment & Plan Note (Addendum)
Chronic. Na 133. Stable. Asymptomatic. TSH normal @ 1.4, AM cortisol normal 13.1.  normal serum osm of 295, urine Na 18, urine osm 201.  BUN was elevated at 71. Looks more like non-hypotonic hyponatremia. Possibly due to uremia from elevated BUN.  BUN is now 16, and Na of 133.  05-08-2023 stable Na of 131.

## 2023-04-19 NOTE — Assessment & Plan Note (Signed)
-   We will continue statin therapy. 

## 2023-04-19 NOTE — Assessment & Plan Note (Signed)
-   The patient will be placed on supplemental coverage with NovoLog. - We will continue basal coverage. 

## 2023-04-19 NOTE — Assessment & Plan Note (Addendum)
05-07-2023 continue Flomax 0.4 mg qday  05-08-2023 stable.

## 2023-04-19 NOTE — H&P (Signed)
[place]   PATIENT NAME: Manuel [name]    MR#:  [id number]  DATE OF BIRTH:  [birth year]  DATE OF ADMISSION:  04/18/2023  PRIMARY CARE PHYSICIAN: Gracelyn Nurse, MD   Patient is coming from: Home  REQUESTING/REFERRING PHYSICIAN: Delton Prairie, MD  CHIEF COMPLAINT:   Chief Complaint  Patient presents with   Altered Mental Status   Weakness   Fall   Leg Swelling    HISTORY OF PRESENT ILLNESS:  JAMAINE LAHNER is a 77 y.o. Caucasian male with medical history significant for osteoarthritis, coronary artery disease, essential hypertension, carotid artery disease, DDD, type 2 diabetes mellitus, gout, dyslipidemia and ischemic cardiomyopathy as well as OSA and peripheral neuropathy, who presented to the emergency room with acute onset of altered mental status with confusion.  He had a right foot transmetatarsal amputation by Dr. Wyn Quaker this summer.  Since then he has had an ulcer which has been draining.  He admitted to associated right foot pain with swelling and erythema.  He was seen in the ER for mechanical fall 4 days ago and was given p.o. Bactrim and Keflex which he has been taking but missed Christmas Day.  He was having mild AKI then.  He admitted to diminish fluid intake likely.  Did not any nausea or vomiting or diarrhea or abdominal pain.  No fever or chills.  No chest pain or palpitations.  No cough or wheezing or dyspnea.  ED Course: When he came to the ER, vital signs were within normal and later BP was 149/85.  Labs revealed hyponatremia 123 and hypochloremia of 90 with a CO2 of 21 blood glucose of 249, BUN of 71 and creatinine 1.98 up from 48 and 1.51 on 12/24 and albumin 3.2 with AST 55 and ALT 48.  CBC showed leukocytosis 16.5 and anemia.  Procalcitonin was elevated 2.13.  Lactic acid was 1.1.  UA showed more than 500 glucose.  Blood cultures were drawn.  EKG as reviewed by me : None Imaging:  Right lower extremity ultrasound showed no DVT.  The patient was given  IV vancomycin and cefepime, 1 mg of IV Dilaudid and 1 L bolus of IV normal saline.  He will be admitted to a medical telemetry bed for further evaluation and management. PAST MEDICAL HISTORY:   Past Medical History:  Diagnosis Date   Acute ST elevation myocardial infarction (STEMI) of inferior wall (HCC) 04/19/2010   a.) transfered from [facility] to [facility] --> LHC/PCI (very difficult procedure) --> 3.0 x 23 mm and 3.0 x 12 mm Xience stents to RCA   Allergies    Arthritis    Benign essential hypertension    Bilateral carotid artery disease (HCC) 05/08/2021   a.) carotid doppler 05/08/2021: 1-39% BICA   CAD (coronary artery disease) 04/19/2010   a.) inferior STEMI 04/19/2010 --> LHC/PCI: 50-70% pD1, 80% pRI, 90/90/90% RCA (overlapping 3.0 x 23 and 3.0 x 12 mm Xience DES); b.) MV 11/10/2018: fixed minimally reversible inferior perfusion defect   Cellulitis of foot    DDD (degenerative disc disease), cervical    Diabetes mellitus type 2, insulin dependent (HCC)    Diverticulosis    Full dentures    Gout    Hard of hearing    History of bilateral cataract extraction 2022   History of ETOH abuse    Hyperlipidemia    Ischemic cardiomyopathy 04/19/2010   a.) TTE 04/19/2010: 40%; b.) TTE 04/20/2014: EF >55%, mild RVE, triv PR, mild MR/TR,  G1DD; c.) TTE 11/10/2018: EF 45%, inf HK, mild RVE, triv TR/PR, mild MR, G1DD; d.) TTE 11/14/2022: EF 25-30%, basal anteroseptal, apical lateral, apical septal, and apex AK, mid anterolateral HK, mild-mod MR/TR   Long term current use of aspirin    Long term current use of clopidogrel    Lumbar degenerative disc disease    Lumbar radiculopathy    Lumbar vertebral fracture (chronic superior endplate of L1)    OSA (obstructive sleep apnea)    a.) unable to tolerate nocturnal PAP therapy   Peripheral artery disease (HCC)    a.) stenting 05/14/21: 12 mm x 12 cm LifeStent RIGHT dis SFA/prox pop; b.) s/p cath directed thrombolysis RIGHT SFA/pop 06/14/21; c.) s/p mech  thrombectomy + stenting 06/15/21: 8 mm x 25cm & 8 mm x 7.5cm Viabahn; d.) s/p BILAT CFA, profunda femoris, SFA endarterectomies + fogarty embolectomy + stenting 11/15/22: 12mm x 58mm Lifestream BILAT CIAs, 14 mm x 6 cm Lifestream & 13 mm x 5 cm Viabahn LEFT EIA   Peripheral neuropathy    Umbilical hernia     PAST SURGICAL HISTORY:   Past Surgical History:  Procedure Laterality Date   AMPUTATION Right 11/18/2022   Procedure: AMPUTATION 4TH AND 5TH RAY;  Surgeon: Linus Galas, [name];  Location: [facility];  Service: Orthopedics/Podiatry;  Laterality: Right;  4th and 5th toe   APPLICATION OF WOUND VAC Right 01/09/2023   Procedure: APPLICATION OF WOUND VAC;  Surgeon: Annice Needy, MD;  Location: [facility];  Service: Vascular;  Laterality: Right;   CATARACT EXTRACTION W/PHACO Right 03/14/2021   Procedure: CATARACT EXTRACTION PHACO AND INTRAOCULAR LENS PLACEMENT (IOC) RIGHT DIABETIC;  Surgeon: Lockie Mola, MD;  Location: [facility];  Service: Ophthalmology;  Laterality: Right;  Diabetic 16.78 01:39.9   CATARACT EXTRACTION W/PHACO Left 03/28/2021   Procedure: CATARACT EXTRACTION PHACO AND INTRAOCULAR LENS PLACEMENT (IOC) LEFT DIABETIC 6.93 01:22.0;  Surgeon: Lockie Mola, MD;  Location: [facility];  Service: Ophthalmology;  Laterality: Left;  Diabetic   COLONOSCOPY     CORONARY ANGIOPLASTY WITH STENT PLACEMENT  03/2010   Procedure: CORONARY ANGIOPLASTY WITH STENT PLACEMENT; Location: [name]   ENDARTERECTOMY FEMORAL Bilateral 11/15/2022   Procedure: BILATERAL COMMON FEMORAL PROFUNDA FEMORIS AND SUPERFICIAL FEMORAL ARTERY ENDARTECTOMIES, RIGHT FOGARTY EMBOLECTOMY OF THE RIGHT SFA  AND  POPLITEAL ARTERIES. AORTAGRAM AND RIGHT LOWER EXTREMITY ANGIOGRAM.;  Surgeon: Annice Needy, MD;  Location: [facility];  Service: Vascular;  Laterality: Bilateral;   INSERTION OF ILIAC STENT Bilateral 11/15/2022   Procedure: BILATERAL STENT INSERTION IN BILATERAL  COMMON ILIAC ARTERY, STENT INSERTION  OF LEFT EXTERNAL ILIAC ARTERY. ANGIOPLASTY RIGHT TIBIAL  AND POPLITEAL ARTERY.;  Surgeon: Annice Needy, MD;  Location: [facility];  Service: Vascular;  Laterality: Bilateral;   LOWER EXTREMITY ANGIOGRAPHY Right 05/14/2021   Procedure: LOWER EXTREMITY ANGIOGRAPHY;  Surgeon: Annice Needy, MD;  Location: [facility];  Service: Cardiovascular;  Laterality: Right;   LOWER EXTREMITY ANGIOGRAPHY Right 06/14/2021   Procedure: Lower Extremity Angiography;  Surgeon: Annice Needy, MD;  Location: [facility];  Service: Cardiovascular;  Laterality: Right;   LOWER EXTREMITY ANGIOGRAPHY Right 11/11/2022   Procedure: Lower Extremity Angiography;  Surgeon: Annice Needy, MD;  Location: [facility];  Service: Cardiovascular;  Laterality: Right;   LOWER EXTREMITY INTERVENTION Right 06/15/2021   Procedure: LOWER EXTREMITY INTERVENTION;  Surgeon: Annice Needy, MD;  Location: [facility];  Service: Cardiovascular;  Laterality: Right;   TONSILLECTOMY  TRANSMETATARSAL AMPUTATION Right 02/17/2023   Procedure: TRANSMETATARSAL AMPUTATION;  Surgeon: Rosetta Posner, [name];  Location: [facility];  Service: Orthopedics/Podiatry;  Laterality: Right;   WOUND DEBRIDEMENT Right 01/09/2023   Procedure: DEBRIDEMENT WOUND;  Surgeon: Annice Needy, MD;  Location: [facility];  Service: Vascular;  Laterality: Right;    SOCIAL HISTORY:   Social History   Tobacco Use   Smoking status: Never   Smokeless tobacco: Never  Substance Use Topics   Alcohol use: Yes    Alcohol/week: 21.0 standard drinks of alcohol    Types: 7 Glasses of wine, 14 Cans of beer per week    Comment: occassional    FAMILY HISTORY:   Family History  Problem Relation Age of Onset   Scoliosis Mother    Heart disease Father     DRUG ALLERGIES:  No Known Allergies  REVIEW OF SYSTEMS:   ROS As per history of present illness. All pertinent systems were reviewed above. Constitutional, HEENT, cardiovascular, respiratory, GI,  GU, musculoskeletal, neuro, psychiatric, endocrine, integumentary and hematologic systems were reviewed and are otherwise negative/unremarkable except for positive findings mentioned above in the HPI.   MEDICATIONS AT HOME:   Prior to Admission medications   Medication Sig Start Date End Date Taking? Authorizing Provider  acetaminophen (TYLENOL) 325 MG tablet Take 2 tablets (650 mg total) by mouth every 6 (six) hours as needed for mild pain (pain score 1-3) (or Fever >/= 101). 02/19/23   Arnetha Courser, MD  aspirin EC 81 MG tablet Take 1 tablet (81 mg total) by mouth daily. 11/26/22   Delfino Lovett, MD  atorvastatin (LIPITOR) 80 MG tablet Take 1 tablet (80 mg total) by mouth daily. Patient taking differently: Take 80 mg by mouth at bedtime. 11/26/22   Delfino Lovett, MD  carvedilol (COREG) 25 MG tablet Take 25 mg by mouth 2 (two) times daily with a meal.    [provider]  cephALEXin (KEFLEX) 500 MG capsule Take 1 capsule (500 mg total) by mouth 3 (three) times daily for 10 days. 04/15/23 04/25/23  Sharman Cheek, MD  clopidogrel (PLAVIX) 75 MG tablet Take 1 tablet (75 mg total) by mouth daily. 09/18/22   Georgiana Spinner, NP  dapagliflozin propanediol (FARXIGA) 10 MG TABS tablet Take 1 tablet (10 mg total) by mouth daily. 01/20/23 04/20/23  Gillis Santa, MD  Ensure Max Protein (ENSURE MAX PROTEIN) LIQD Take 330 mLs (11 oz total) by mouth 2 (two) times daily. Patient taking differently: Take 11 oz by mouth daily. 11/26/22   Delfino Lovett, MD  fluticasone (FLONASE) 50 MCG/ACT nasal spray Place 1 spray into both nostrils daily as needed for allergies or rhinitis.    [provider]  folic acid (FOLVITE) 1 MG tablet Take 1 tablet (1 mg total) by mouth daily. 02/20/23   Arnetha Courser, MD  insulin aspart protamine- aspart (NOVOLOG MIX 70/30) (70-30) 100 UNIT/ML injection Inject 30 Units into the skin 2 (two) times daily with a meal.    [provider]  iron polysaccharides (NIFEREX) 150  MG capsule Take 1 capsule (150 mg total) by mouth daily. 01/20/23 04/20/23  Gillis Santa, MD  mirtazapine (REMERON) 15 MG tablet Take 1 tablet (15 mg total) by mouth at bedtime. 02/19/23   Arnetha Courser, MD  Multiple Vitamin (MULTIVITAMIN WITH MINERALS) TABS tablet Take 1 tablet by mouth daily. 02/20/23   Arnetha Courser, MD  oxyCODONE (OXY IR/ROXICODONE) 5 MG immediate release tablet Take 1 tablet (5 mg total) by mouth every 4 (four)  hours as needed for moderate pain (pain score 4-6). 02/19/23   Arnetha Courser, MD  polyethylene glycol (MIRALAX / GLYCOLAX) 17 g packet Take 17 g by mouth daily as needed for mild constipation. 02/19/23   Arnetha Courser, MD  pregabalin (LYRICA) 75 MG capsule Take 1 capsule (75 mg total) by mouth 2 (two) times daily. 02/14/23   Annice Needy, MD  sacubitril-valsartan (ENTRESTO) 24-26 MG Take 1 tablet by mouth every 12 (twelve) hours. 01/28/23 01/28/24  [provider]  senna-docusate (SENOKOT-S) 8.6-50 MG tablet Take 1 tablet by mouth at bedtime as needed for mild constipation. 06/17/21   Burnadette Pop, MD  spironolactone (ALDACTONE) 50 MG tablet Take 1 tablet (50 mg total) by mouth daily. 11/26/22 01/01/24  Delfino Lovett, MD  sulfamethoxazole-trimethoprim (BACTRIM DS) 800-160 MG tablet Take 1 tablet by mouth 2 (two) times daily for 10 days. 04/15/23 04/25/23  Sharman Cheek, MD  tamsulosin (FLOMAX) 0.4 MG CAPS capsule Take 1 capsule (0.4 mg total) by mouth daily after supper. 01/19/23 04/19/23  Gillis Santa, MD  thiamine (VITAMIN B-1) 100 MG tablet Take 1 tablet (100 mg total) by mouth daily. 02/20/23   Arnetha Courser, MD  torsemide (DEMADEX) 20 MG tablet Take 1 tablet (20 mg total) by mouth daily. 01/20/23 04/20/23  Gillis Santa, MD      VITAL SIGNS:  Blood pressure 124/77, pulse 82, temperature 98.8 F (37.1 C), temperature source Oral, resp. rate 12, height 5\' 8"  (1.727 m), weight 83.9 kg, SpO2 93%.  PHYSICAL EXAMINATION:  Physical Exam  GENERAL:  77  y.o.-year-old Caucasian male patient lying in the bed with no acute distress.  EYES: Pupils equal, round, reactive to light and accommodation. No scleral icterus. Extraocular muscles intact.  HEENT: Head atraumatic, normocephalic. Oropharynx and nasopharynx clear.  NECK:  Supple, no jugular venous distention. No thyroid enlargement, no tenderness.  LUNGS: Normal breath sounds bilaterally, no wheezing, rales,rhonchi or crepitation. No use of accessory muscles of respiration.  CARDIOVASCULAR: Regular rate and rhythm, S1, S2 normal. No murmurs, rubs, or gallops.  ABDOMEN: Soft, nondistended, nontender. Bowel sounds present. No organomegaly or mass.  EXTREMITIES: No pedal edema, cyanosis, or clubbing.  NEUROLOGIC: Cranial nerves II through XII are intact. Muscle strength 5/5 in all extremities. Sensation intact. Gait not checked.  PSYCHIATRIC: The patient is alert and oriented x 3.  Normal affect and good eye contact. SKIN: Right foot stump erythema, swelling and tenderness with purulent drainage from distal ulcer.  LABORATORY PANEL:   CBC Recent Labs  Lab 04/19/23 0442  WBC 15.6*  HGB 10.3*  HCT 30.1*  PLT 293   ------------------------------------------------------------------------------------------------------------------  Chemistries  Recent Labs  Lab 04/18/23 1759 04/19/23 0442  NA 123* 126*  K 5.0 4.8  CL 90* 98  CO2 21* 18*  GLUCOSE 249* 190*  BUN 71* 67*  CREATININE 1.98* 1.71*  CALCIUM 8.7* 8.1*  AST 55*  --   ALT 48*  --   ALKPHOS 112  --   BILITOT 0.6  --   CBC showed leukocytosis 16.5 with hemoglobin 11.7, hematocrit 34 and platelets 296. ------------------------------------------------------------------------------------------------------------------  Cardiac Enzymes No results for input(s): "TROPONINI" in the last 168 hours. ------------------------------------------------------------------------------------------------------------------  RADIOLOGY:  US  Venous Img Lower Right (DVT Study) Result Date: 04/18/2023 CLINICAL DATA:  Right lower extremity swelling. EXAM: Right LOWER EXTREMITY VENOUS DOPPLER ULTRASOUND TECHNIQUE: Gray-scale sonography with compression, as well as color and duplex ultrasound, were performed to evaluate the deep venous system(s) from the level of the common femoral vein through  the popliteal and proximal calf veins. COMPARISON:  None Available. FINDINGS: VENOUS Normal compressibility of the common femoral, superficial femoral, and popliteal veins, as well as the visualized calf veins. Visualized portions of profunda femoral vein and great saphenous vein unremarkable. No filling defects to suggest DVT on grayscale or color Doppler imaging. Doppler waveforms show normal direction of venous flow, normal respiratory plasticity and response to augmentation. Limited views of the contralateral common femoral vein are unremarkable. OTHER There is subcutaneous edema. Limitations: none IMPRESSION: No DVT identified. Electronically Signed   By: Elgie Collard M.D.   On: 04/18/2023 19:13      IMPRESSION AND PLAN:  Assessment and Plan: * Sepsis due to cellulitis [facility]) - The patient will be admitted to a medical telemetry bed. - Will continue antibiotic therapy with IV vancomycin and Rocephin. - We will continue hydration with IV lactated ringer. - We will follow blood and wound cultures.  Acute kidney injury superimposed on chronic kidney disease (HCC) - This is likely prerenal. - The patient will be hydrated with IV lactated ringer. - We will follow BMPs. - We will avoid nephrotoxins.   Hyponatremia - Will obtain hyponatremia workup. - I suspect hypovolemic etiology. - We will continue hydration with IV lactated ringer and follow sodium level.  Acute metabolic encephalopathy - This is likely multifactorial secondary to #1, 2 and 3. - Management as above. - Will follow mental status.  Uncontrolled type 2 diabetes  mellitus with hyperglycemia, with long-term current use of insulin (HCC) - The patient will be placed on supplemental coverage with NovoLog. - We will continue basal coverage.  BPH (benign prostatic hyperplasia) - We will continue Flomax.  Dyslipidemia - We will continue statin therapy  Essential hypertension - We will continue antihypertensive therapy while holding off nephrotoxins.   DVT prophylaxis: Lovenox.  Advanced Care Planning:  Code Status: The patient is DNR and DNI.  This was discussed with him. Family Communication:  The plan of care was discussed in details with the patient (and family). I answered all questions. The patient agreed to proceed with the above mentioned plan. Further management will depend upon hospital course. Disposition Plan: Back to previous home environment Consults called: none.  All the records are reviewed and case discussed with ED provider.  Status is: Inpatient  At the time of the admission, it appears that the appropriate admission status for this patient is inpatient.  This is judged to be reasonable and necessary in order to provide the required intensity of service to ensure the patient's safety given the presenting symptoms, physical exam findings and initial radiographic and laboratory data in the context of comorbid conditions.  The patient requires inpatient status due to high intensity of service, high risk of further deterioration and high frequency of surveillance required.  I certify that at the time of admission, it is my clinical judgment that the patient will require inpatient hospital care extending more than 2 midnights.                            Dispo: The patient is from: Home              Anticipated d/c is to: Home              Patient currently is not medically stable to d/c.              Difficult to place patient: No  Pegge Cumberledge A Ark Agrusa  M.D on 04/19/2023 at 5:14 AM  [facility]   From 7 PM-7 AM, contact  night-coverage [email or web address]  CC: Primary care physician; Gracelyn Nurse, MD

## 2023-04-19 NOTE — Assessment & Plan Note (Signed)
-   This is likely multifactorial secondary to #1, 2 and 3. - Management as above. - Will follow mental status.

## 2023-04-19 NOTE — Assessment & Plan Note (Addendum)
-   This is likely prerenal. - The patient will be hydrated with IV lactated ringer. - We will follow BMPs. - We will avoid nephrotoxins.

## 2023-04-19 NOTE — Progress Notes (Signed)
CODE SEPSIS - PHARMACY COMMUNICATION  **Broad Spectrum Antibiotics should be administered within 1 hour of Sepsis diagnosis**  Time Code Sepsis Called/Page Received: 0210  Antibiotics Ordered: Cefepime & Vancomycin  Time of 1st antibiotic administration: 0205  Otelia Sergeant, PharmD, MBA 04/19/2023 2:10 AM

## 2023-04-19 NOTE — Consult Note (Addendum)
PODIATRY / FOOT AND ANKLE SURGERY CONSULTATION NOTE  Requesting Physician: Dr. Helane Rima  Reason for consult: Right foot infection   HPI: Manuel [name] is a 77 y.o. male who presents with a worsening infection of the right foot.  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  He had debridements performed multiple times to areas of wound that did not heal after transmetatarsal amputation on the right side with also application of PuraPly grafts.  He went to the emergency room on 12/24 due to falling and having a right knee effusion where his foot was checked and noted to have some early signs of cellulitis present.  Patient was placed on oral antibiotics instructed on follow-up in clinic.  Patient missed his appointment in clinic yesterday and then subsequently went to the emergency room the next day due to worsening pain, redness, and swelling to the right leg with altered mental status.  Patient has now been admitted to the hospital for further assessment.  Patient currently denies nausea, vomiting, fevers, chills, patient complains of severe pain to the right lower extremity.  Patient also appears to be drowsy today on examination and for history.  PMHx:  Past Medical History:  Diagnosis Date   Acute ST elevation myocardial infarction (STEMI) of inferior wall (HCC) 04/19/2010   a.) transfered from [facility] to [facility] --> LHC/PCI (very difficult procedure) --> 3.0 x 23 mm and 3.0 x 12 mm Xience stents to RCA   Allergies    Arthritis    Benign essential hypertension    Bilateral carotid artery disease (HCC) 05/08/2021   a.) carotid doppler 05/08/2021: 1-39% BICA   CAD (coronary artery disease) 04/19/2010   a.) inferior STEMI 04/19/2010 --> LHC/PCI: 50-70% pD1, 80% pRI, 90/90/90% RCA (overlapping  3.0 x 23 and 3.0 x 12 mm Xience DES); b.) MV 11/10/2018: fixed minimally reversible inferior perfusion defect   Cellulitis of foot    DDD (degenerative disc disease), cervical    Diabetes mellitus type 2, insulin dependent (HCC)    Diverticulosis    Full dentures    Gout    Hard of hearing    History of bilateral cataract extraction 2022   History of ETOH abuse    Hyperlipidemia    Ischemic cardiomyopathy 04/19/2010   a.) TTE 04/19/2010: 40%; b.) TTE 04/20/2014: EF >55%, mild RVE, triv PR, mild MR/TR, G1DD; c.) TTE 11/10/2018: EF 45%, inf HK, mild RVE, triv TR/PR, mild MR, G1DD; d.) TTE 11/14/2022: EF 25-30%, basal anteroseptal, apical lateral, apical septal, and apex AK, mid anterolateral HK, mild-mod MR/TR   Long term current use of aspirin    Long term current use of clopidogrel    Lumbar degenerative disc disease    Lumbar radiculopathy    Lumbar vertebral fracture (chronic superior endplate of L1)    OSA (obstructive sleep apnea)    a.) unable to tolerate nocturnal PAP therapy   Peripheral artery disease (HCC)    a.) stenting 05/14/21: 12 mm x 12 cm LifeStent RIGHT dis SFA/prox pop; b.) s/p cath directed thrombolysis RIGHT SFA/pop 06/14/21; c.) s/p mech thrombectomy + stenting 06/15/21: 8 mm x 25cm & 8 mm x 7.5cm Viabahn; d.) s/p BILAT CFA, profunda femoris, SFA endarterectomies + fogarty embolectomy + stenting 11/15/22: 12mm x 58mm Lifestream BILAT CIAs,  14 mm x 6 cm Lifestream & 13 mm x 5 cm Viabahn LEFT EIA   Peripheral neuropathy    Umbilical hernia     Surgical Hx:  Past Surgical History:  Procedure Laterality Date   AMPUTATION Right 11/18/2022   Procedure: AMPUTATION 4TH AND 5TH RAY;  Surgeon: Linus Galas, [name];  Location: [facility];  Service: Orthopedics/Podiatry;  Laterality: Right;  4th and 5th toe   APPLICATION OF WOUND VAC Right 01/09/2023   Procedure: APPLICATION OF WOUND VAC;  Surgeon: Annice Needy, MD;  Location: [facility];  Service: Vascular;  Laterality: Right;    CATARACT EXTRACTION W/PHACO Right 03/14/2021   Procedure: CATARACT EXTRACTION PHACO AND INTRAOCULAR LENS PLACEMENT (IOC) RIGHT DIABETIC;  Surgeon: Lockie Mola, MD;  Location: [facility];  Service: Ophthalmology;  Laterality: Right;  Diabetic 16.78 01:39.9   CATARACT EXTRACTION W/PHACO Left 03/28/2021   Procedure: CATARACT EXTRACTION PHACO AND INTRAOCULAR LENS PLACEMENT (IOC) LEFT DIABETIC 6.93 01:22.0;  Surgeon: Lockie Mola, MD;  Location: [facility];  Service: Ophthalmology;  Laterality: Left;  Diabetic   COLONOSCOPY     CORONARY ANGIOPLASTY WITH STENT PLACEMENT  03/2010   Procedure: CORONARY ANGIOPLASTY WITH STENT PLACEMENT; Location: [name]   ENDARTERECTOMY FEMORAL Bilateral 11/15/2022   Procedure: BILATERAL COMMON FEMORAL PROFUNDA FEMORIS AND SUPERFICIAL FEMORAL ARTERY ENDARTECTOMIES, RIGHT FOGARTY EMBOLECTOMY OF THE RIGHT SFA  AND  POPLITEAL ARTERIES. AORTAGRAM AND RIGHT LOWER EXTREMITY ANGIOGRAM.;  Surgeon: Annice Needy, MD;  Location: [facility];  Service: Vascular;  Laterality: Bilateral;   INSERTION OF ILIAC STENT Bilateral 11/15/2022   Procedure: BILATERAL STENT INSERTION IN BILATERAL  COMMON ILIAC ARTERY, STENT INSERTION OF LEFT EXTERNAL ILIAC ARTERY. ANGIOPLASTY RIGHT TIBIAL  AND POPLITEAL ARTERY.;  Surgeon: Annice Needy, MD;  Location: [facility];  Service: Vascular;  Laterality: Bilateral;   LOWER EXTREMITY ANGIOGRAPHY Right 05/14/2021   Procedure: LOWER EXTREMITY ANGIOGRAPHY;  Surgeon: Annice Needy, MD;  Location: [facility];  Service: Cardiovascular;  Laterality: Right;   LOWER EXTREMITY ANGIOGRAPHY Right 06/14/2021   Procedure: Lower Extremity Angiography;  Surgeon: Annice Needy, MD;  Location: [facility];  Service: Cardiovascular;  Laterality: Right;   LOWER EXTREMITY ANGIOGRAPHY Right 11/11/2022   Procedure: Lower Extremity Angiography;  Surgeon: Annice Needy, MD;  Location: [facility];  Service: Cardiovascular;   Laterality: Right;   LOWER EXTREMITY INTERVENTION Right 06/15/2021   Procedure: LOWER EXTREMITY INTERVENTION;  Surgeon: Annice Needy, MD;  Location: [facility];  Service: Cardiovascular;  Laterality: Right;   TONSILLECTOMY     TRANSMETATARSAL AMPUTATION Right 02/17/2023   Procedure: TRANSMETATARSAL AMPUTATION;  Surgeon: Rosetta Posner, [name];  Location: [facility];  Service: Orthopedics/Podiatry;  Laterality: Right;   WOUND DEBRIDEMENT Right 01/09/2023   Procedure: DEBRIDEMENT WOUND;  Surgeon: Annice Needy, MD;  Location: [facility];  Service: Vascular;  Laterality: Right;    FHx:  Family History  Problem Relation Age of Onset   Scoliosis Mother    Heart disease Father     Social History:  reports that he has never smoked. He has never used smokeless tobacco. He reports current alcohol use of about 21.0 standard drinks of alcohol per week. He reports that he does not use drugs.  Allergies: No Known Allergies  (Not in a hospital admission)   Physical Exam: General: Alert and oriented.  No apparent distress.  Vascular: Nonpalpable pulses bil  DP/PT, CFT intact to foot bil, no hair growth present to BLE.  Both  feet warm to touch.  Erythema and edema extending from the midfoot amputation site to the ankle and slightly below knee.  Neuro: Light touch sensation reduced to nearly absent to bilateral lower extremities.  Derm: Open ulceration present near the interval between the first and second metatarsal amputation sites, probes deep to bone at the first and second metatarsals, serial purulent drainage coming from the area with mild odor, tissues in this area appeared to be dusky and started to become necrotic, he does appear to have some palpable fluctuance present to this area as well.    MSK: Right transmetatarsal amputation with pain on palpation  Results for orders placed or performed during the hospital encounter of 04/18/23 (from the past 48 hours)  Lactic acid, plasma      Status: None   Collection Time: 04/18/23  5:58 PM  Result Value Ref Range   Lactic Acid, Venous 1.1 0.5 - 1.9 mmol/L    Comment: Performed at [facility], [address] [place], [place] [address]  Urinalysis, Routine w reflex microscopic -Urine, Clean Catch     Status: Abnormal   Collection Time: 04/18/23  5:58 PM  Result Value Ref Range   Color, Urine STRAW (A) YELLOW   APPearance CLEAR (A) CLEAR   Specific Gravity, Urine 1.005 1.005 - 1.030   pH 5.0 5.0 - 8.0   Glucose, UA >=500 (A) NEGATIVE mg/dL   Hgb urine dipstick SMALL (A) NEGATIVE   Bilirubin Urine NEGATIVE NEGATIVE   Ketones, ur NEGATIVE NEGATIVE mg/dL   Protein, ur NEGATIVE NEGATIVE mg/dL   Nitrite NEGATIVE NEGATIVE   Leukocytes,Ua NEGATIVE NEGATIVE   RBC / HPF 0 0 - 5 RBC/hpf   WBC, UA 0-5 0 - 5 WBC/hpf   Bacteria, UA NONE SEEN NONE SEEN   Squamous Epithelial / HPF 0-5 0 - 5 /HPF   Hyaline Casts, UA PRESENT     Comment: Performed at [facility], [address] [place], [place] [address]  Osmolality, urine     Status: Abnormal   Collection Time: 04/18/23  5:58 PM  Result Value Ref Range   Osmolality, Ur 201 (L) 300 - 900 mOsm/kg    Comment: REPEATED TO VERIFY Performed at [facility], [address] [place], [place] [address]   Na and K (sodium & potassium), rand urine     Status: None   Collection Time: 04/18/23  5:58 PM  Result Value Ref Range   Sodium, Ur 18 mmol/L   Potassium Urine 13 mmol/L    Comment: Performed at [facility], [address] [place], [place] [address]  Comprehensive metabolic panel     Status: Abnormal   Collection Time: 04/18/23  5:59 PM  Result Value Ref Range   Sodium 123 (L) 135 - 145 mmol/L   Potassium 5.0 3.5 - 5.1 mmol/L   Chloride 90 (L) 98 - 111 mmol/L   CO2 21 (L) 22 - 32 mmol/L   Glucose, Bld 249 (H) 70 - 99 mg/dL    Comment: Glucose reference range applies only to samples taken after fasting for at least 8 hours.   BUN 71 (H) 8  - 23 mg/dL   Creatinine, Ser 2.99 (H) 0.61 - 1.24 mg/dL   Calcium 8.7 (L) 8.9 - 10.3 mg/dL   Total Protein 7.2 6.5 - 8.1 g/dL   Albumin 3.2 (L) 3.5 - 5.0 g/dL   AST 55 (H) 15 - 41 U/L   ALT 48 (H) 0 - 44 U/L  Alkaline Phosphatase 112 38 - 126 U/L   Total Bilirubin 0.6 <1.2 mg/dL   GFR, Estimated 34 (L) >60 mL/min    Comment: (NOTE) Calculated using the CKD-EPI Creatinine Equation (2021)    Anion gap 12 5 - 15    Comment: Performed at [facility], [address] [place], [place] [address]  CBC     Status: Abnormal   Collection Time: 04/18/23  5:59 PM  Result Value Ref Range   WBC 16.5 (H) 4.0 - 10.5 K/uL   RBC 4.07 (L) 4.22 - 5.81 MIL/uL   Hemoglobin 11.7 (L) 13.0 - 17.0 g/dL   HCT 60.4 (L) 54.0 - 98.1 %   MCV 83.5 80.0 - 100.0 fL   MCH 28.7 26.0 - 34.0 pg   MCHC 34.4 30.0 - 36.0 g/dL   RDW 19.1 (H) 47.8 - 29.5 %   Platelets 296 150 - 400 K/uL   nRBC 0.0 0.0 - 0.2 %    Comment: Performed at [facility], [address] [place], [place] [address]  Blood culture (routine x 2)     Status: None (Preliminary result)   Collection Time: 04/19/23  1:50 AM   Specimen: BLOOD  Result Value Ref Range   Specimen Description BLOOD BLOOD LEFT ARM    Special Requests      BOTTLES DRAWN AEROBIC AND ANAEROBIC Blood Culture results may not be optimal due to an inadequate volume of blood received in culture bottles   Culture      NO GROWTH < 12 HOURS Performed at [facility], [address] [place], [place] [address]    Report Status PENDING   Procalcitonin     Status: None   Collection Time: 04/19/23  1:50 AM  Result Value Ref Range   Procalcitonin 2.13 ng/mL    Comment:        Interpretation: PCT > 2 ng/mL: Systemic infection (sepsis) is likely, unless other causes are known. (NOTE)       Sepsis PCT Algorithm           Lower Respiratory Tract                                      Infection PCT Algorithm    ----------------------------      ----------------------------         PCT < 0.25 ng/mL                PCT < 0.10 ng/mL          Strongly encourage             Strongly discourage   discontinuation of antibiotics    initiation of antibiotics    ----------------------------     -----------------------------       PCT 0.25 - 0.50 ng/mL            PCT 0.10 - 0.25 ng/mL               OR       >80% decrease in PCT            Discourage initiation of                                            antibiotics  Encourage discontinuation           of antibiotics    ----------------------------     -----------------------------         PCT >= 0.50 ng/mL              PCT 0.26 - 0.50 ng/mL               AND       <80% decrease in PCT              Encourage initiation of                                             antibiotics       Encourage continuation           of antibiotics    ----------------------------     -----------------------------        PCT >= 0.50 ng/mL                  PCT > 0.50 ng/mL               AND         increase in PCT                  Strongly encourage                                      initiation of antibiotics    Strongly encourage escalation           of antibiotics                                     -----------------------------                                           PCT <= 0.25 ng/mL                                                 OR                                        > 80% decrease in PCT                                      Discontinue / Do not initiate                                             antibiotics  Performed at [facility], [address] [place], [place] [address]   Blood culture (routine x 2)     Status: None (Preliminary result)   Collection Time: 04/19/23  1:56 AM   Specimen: BLOOD  Result Value Ref Range   Specimen Description BLOOD BLOOD RIGHT ARM    Special Requests      BOTTLES DRAWN AEROBIC AND ANAEROBIC Blood Culture adequate volume   Culture       NO GROWTH < 12 HOURS Performed at [facility], [address] [place], [place] [address]    Report Status PENDING   Protime-INR     Status: Abnormal   Collection Time: 04/19/23  4:42 AM  Result Value Ref Range   Prothrombin Time 17.4 (H) 11.4 - 15.2 seconds   INR 1.4 (H) 0.8 - 1.2    Comment: (NOTE) INR goal varies based on device and disease states. Performed at [facility], [address] [place], [place] [address]   Cortisol-am, blood     Status: None   Collection Time: 04/19/23  4:42 AM  Result Value Ref Range   Cortisol - AM 13.1 6.7 - 22.6 ug/dL    Comment: Performed at [facility], [address] [place], [place] [address]  Basic metabolic panel     Status: Abnormal   Collection Time: 04/19/23  4:42 AM  Result Value Ref Range   Sodium 126 (L) 135 - 145 mmol/L   Potassium 4.8 3.5 - 5.1 mmol/L   Chloride 98 98 - 111 mmol/L   CO2 18 (L) 22 - 32 mmol/L   Glucose, Bld 190 (H) 70 - 99 mg/dL    Comment: Glucose reference range applies only to samples taken after fasting for at least 8 hours.   BUN 67 (H) 8 - 23 mg/dL   Creatinine, Ser 6.96 (H) 0.61 - 1.24 mg/dL   Calcium 8.1 (L) 8.9 - 10.3 mg/dL   GFR, Estimated 40 (L) >60 mL/min    Comment: (NOTE) Calculated using the CKD-EPI Creatinine Equation (2021)    Anion gap 10 5 - 15    Comment: Performed at [facility], [address] [place], [place] [address]  CBC     Status: Abnormal   Collection Time: 04/19/23  4:42 AM  Result Value Ref Range   WBC 15.6 (H) 4.0 - 10.5 K/uL   RBC 3.62 (L) 4.22 - 5.81 MIL/uL   Hemoglobin 10.3 (L) 13.0 - 17.0 g/dL   HCT 41.3 (L) 24.4 - 01.0 %   MCV 83.1 80.0 - 100.0 fL   MCH 28.5 26.0 - 34.0 pg   MCHC 34.2 30.0 - 36.0 g/dL   RDW 27.2 (H) 53.6 - 64.4 %   Platelets 293 150 - 400 K/uL   nRBC 0.0 0.0 - 0.2 %    Comment: Performed at [facility], [address] [place], [place] [address]  TSH     Status: None   Collection Time:  04/19/23  4:42 AM  Result Value Ref Range   TSH 1.416 0.350 - 4.500 uIU/mL    Comment: Performed by a 3rd Generation assay with a functional sensitivity of <=0.01 uIU/mL. Performed at [facility], [address] [place], [place] [address]   Osmolality     Status: None   Collection Time: 04/19/23  4:42 AM  Result Value Ref Range   Osmolality 295 275 - 295 mOsm/kg    Comment: REPEATED TO VERIFY Performed at [facility], [address] [place], [place] [address]   CBG monitoring, ED     Status: Abnormal   Collection Time: 04/19/23  7:51 AM  Result Value Ref Range  Glucose-Capillary 152 (H) 70 - 99 mg/dL    Comment: Glucose reference range applies only to samples taken after fasting for at least 8 hours.  CBG monitoring, ED     Status: Abnormal   Collection Time: 04/19/23 11:45 AM  Result Value Ref Range   Glucose-Capillary 157 (H) 70 - 99 mg/dL    Comment: Glucose reference range applies only to samples taken after fasting for at least 8 hours.   MR FOOT RIGHT WO CONTRAST Result Date: 04/19/2023 CLINICAL DATA:  Osteomyelitis, foot EXAM: MRI OF THE RIGHT FOREFOOT WITHOUT CONTRAST TECHNIQUE: Multiplanar, multisequence MR imaging of the right foot was performed. No intravenous contrast was administered. COMPARISON:  X-ray 04/19/2023, 02/17/2023 FINDINGS: Bones/Joint/Cartilage Postsurgical changes following transmetatarsal amputation of the first through fifth rays. Bone marrow edema is present throughout the residual first through fourth metatarsals with intermediate to low T1 marrow signal. No signal abnormality within the residual fifth metatarsal. No tarsometatarsal joint effusion. Preserved marrow signal within the hindfoot and midfoot. No fracture or dislocation. Ligaments Intact Lisfranc ligament. Muscles and Tendons Amputation changes of the musculotendinous structures of the forefoot. Diffuse edema-like signal of the foot musculature which may represent changes  related to denervation and/or myositis. No tenosynovitis. Soft tissues Shallow soft tissue wound or ulceration at the medial aspect of the distal stump with sinus tracks extending to the resection margins of the first and second metatarsals. Small amount of fluid surrounds both the first and second metatarsal distally. Susceptibility artifact within the tissues adjacent to the first metatarsal suggest soft tissue gas. Generalized soft tissue swelling with subcutaneous edema. IMPRESSION: 1. Prior transmetatarsal amputation of the right foot with findings of acute osteomyelitis involving the first through fourth metatarsals. 2. Shallow soft tissue wound or ulceration at the medial aspect of the distal stump with sinus tracks extending to the resection margins of the first and second metatarsals. Small amount of fluid surrounds both the first and second metatarsal distally. Susceptibility artifact within the tissues adjacent to the first metatarsal suggest soft tissue gas. Electronically Signed   By: Duanne Guess D.O.   On: 04/19/2023 14:13   DG Foot Complete Right Result Date: 04/19/2023 CLINICAL DATA:  Severe right foot cellulitis.  Previous amputation. EXAM: RIGHT FOOT COMPLETE - 3+ VIEW COMPARISON:  None Available. FINDINGS: Previous amputation of all digits is seen at the level of the mid metatarsals. Prominent distal soft tissue swelling is seen. Osteolysis and periostitis is seen involving the 1st and 4th metatarsals, highly suspicious for osteomyelitis. IMPRESSION: Findings highly suspicious for osteomyelitis involving the 1st and 4th metatarsals. Electronically Signed   By: Danae Orleans M.D.   On: 04/19/2023 11:21   US Venous Img Lower Right (DVT Study) Result Date: 04/18/2023 CLINICAL DATA:  Right lower extremity swelling. EXAM: Right LOWER EXTREMITY VENOUS DOPPLER ULTRASOUND TECHNIQUE: Gray-scale sonography with compression, as well as color and duplex ultrasound, were performed to evaluate the  deep venous system(s) from the level of the common femoral vein through the popliteal and proximal calf veins. COMPARISON:  None Available. FINDINGS: VENOUS Normal compressibility of the common femoral, superficial femoral, and popliteal veins, as well as the visualized calf veins. Visualized portions of profunda femoral vein and great saphenous vein unremarkable. No filling defects to suggest DVT on grayscale or color Doppler imaging. Doppler waveforms show normal direction of venous flow, normal respiratory plasticity and response to augmentation. Limited views of the contralateral common femoral vein are unremarkable. OTHER There is subcutaneous edema. Limitations: none IMPRESSION: No DVT  identified. Electronically Signed   By: Elgie Collard M.D.   On: 04/18/2023 19:13    Blood pressure (!) 122/50, pulse 73, temperature 99.7 F (37.6 C), temperature source Axillary, resp. rate 14, height 5\' 8"  (1.727 m), weight 83.9 kg, SpO2 100%.  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 -X-ray imaging and MRI imaging reviewed and discussed with patient and patient's wife in detail.  Appears show osteomyelitis of metatarsals 1 through 4.  The worst area appears to be the first metatarsal which appears to be infected all the way to the first tarsometatarsal joint and started to get into the medial cuneiform.  He also appears to have gas in his tissues at the distal first, second, third metatarsals.  Believe that some of the changes in the lesser metatarsals could be related to inflammation from previous bony resection but difficult to distinguish that from osteomyelitis. -Clinically patient has seropurulent drainage coming from the wound and appears to have bone exposed at the first and second metatarsal level consistent with likely deeper infection.  Tissues in  this area appear to be fairly dusky and gray with some areas of necrosis and mild odor consistent with gas infection.  Patient also has associated erythema and edema that is present to this area to the ankle. -Discussed grave concern of patient's clinical appearance at this time.  Discussed possible treatment options. -Discussed limb salvage consisting of incision and drainage with revisional transmetatarsal amputation but would likely have to remove all the metatarsals to the tarsometatarsal joints.  Believe that he will have difficulty healing this as he did not heal a transmetatarsal amputation and his circulation still appears to be questionable for healing any procedure proximal to what is already been done in the foot.  Discussed that this possibly could get rid of infection but there is a could chance he may still have some degree of residual infection due to the extent of the infection currently present with limb salvage efforts.  Discussed also that by removing all metatarsals the foot will no longer be very functional he would likely reulcerate and have issues with ambulation/balance/function.  Discussed also that due to his circulation and diabetes he may not heal his incision or procedure site and may ultimately require subsequent surgery, wound care and potentially even below-knee amputation. -Discussed other option is below-knee amputation to remove infection hopefully more adequately and hopefully more proximally he would have better circulation to heal the procedure.  Patient would then be able to ambulate with a prosthetic hopefully in the future and be more functional.  This may help with some ischemic pain he has been having in his foot but did not offer any guarantees on any surgery in regards to success to limb salvage or more proximal amputation efforts.  Discussed treatment options in great detail with both patient and patient's wife and they have elected for more proximal amputation at  this time. -Discussed case with Dr. [name], NPO order placed for midnight for surgery tomorrow.  Defer to and appreciate vascular recommendations from here. -Culture pending.  Continue IV Abx.  Podiatry team to sign off at this time.  Please reconsult if any further problems arise.  Rosetta Posner, [name] 04/19/2023, 4:24 PM

## 2023-04-19 NOTE — Sepsis Progress Note (Signed)
Elink monitoring for the code sepsis protocol.  

## 2023-04-19 NOTE — Assessment & Plan Note (Signed)
-   The patient will be admitted to a medical telemetry bed. - Will continue antibiotic therapy with IV vancomycin and Rocephin. - We will continue hydration with IV lactated ringer. - We will follow blood and wound cultures.

## 2023-04-19 NOTE — Progress Notes (Signed)
Pharmacy Antibiotic Note  Manuel [name] is a 77 y.o. male admitted on 04/18/2023 with cellulitis.  Pharmacy has been consulted for Vancomycin dosing for 7 days.  Plan: Pt given Vancomycin 1500 mg once. Vancomycin 750 mg IV Q 24 hrs. Goal AUC 400-550. Expected AUC: 432.7 SCr used: 1.98  Pharmacy will continue to follow and will adjust abx dosing whenever warranted.  Temp (24hrs), Avg:98.8 F (37.1 C), Min:98.4 F (36.9 C), Max:99.3 F (37.4 C)   Recent Labs  Lab 04/15/23 1432 04/18/23 1758 04/18/23 1759  WBC 11.6*  --  16.5*  CREATININE 1.51*  --  1.98*  LATICACIDVEN 0.8 1.1  --     Estimated Creatinine Clearance: 31.9 mL/min (A) (by C-G formula based on SCr of 1.98 mg/dL (H)).    No Known Allergies  Antimicrobials this admission: 12/28 Cefepime >> x 1 dose 12/28 Vancomycin >> x 7 days 12/28 Ceftriaxone >> x 7 days  Microbiology results: 12/28 BCx: Pending  Thank you for allowing pharmacy to be a part of this patient's care.  Otelia Sergeant, PharmD, [facility] 04/19/2023 2:31 AM

## 2023-04-19 NOTE — Progress Notes (Signed)
[facility] PROGRESS NOTE    Progress Note  Manuel [name]  ZOX:[id number] DOB: 1945-05-11 DOA: 04/18/2023 PCP: Gracelyn Nurse, MD     Brief Narrative:   Manuel [name] is an 77 y.o. 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 Kenard Gower and the right fourth and fifth partial ray resection on 11/18/2022 by Dr. Alberteen Spindle  Was seen in the ER 4 days prior to admission was placed on Bactrim and Keflex.  who comes in to the ED with acute onset of acute encephalopathy he relates his ulcer in the foot has been draining since then comes in with pain and swelling as well as erythema along with acute kidney injury decreased oral intake.   Assessment/Plan:   Sepsis due to cellulitis [facility]) in the setting of an ischemic ulcer of the left lower extremity Has a history of PVD postangioplasty and stenting in January 2023.  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  Also recent wound debridement by vascular in January 09, 2023 with a wound VAC in place. Started empirically on IV vancomycin and Rocephin. Blood cultures and urine cultures were sent. He was started on IV fluids. Vascular surgery was consulted.  Vascular requested podiatry consult. Check MRI of the right foot to exclude osteomyelitis.  Acute kidney injury superimposed on chronic kidney disease (HCC) Likely prerenal azotemia in the setting of decreased oral intake. With a baseline creatinine of less than 1, on admission 1.9. Started on IV fluids, his creatinine is improving. Continue IV fluid cautiously as he has an EF of 30%. Monitor strict I's and O's and daily weights.  Possible acute gout flare of the right knee and ankle Exquisitely tender to palpation when I remove  the sheets from the knee he started yelling in pain. Will go ahead and start him on colchicine.  Use steroids at this point in time due to ongoing cellulitis, not a candidate for NSAIDs due to acute kidney injury. Will ask IR for left knee arthrocentesis.  Hypovolemic hyponatremia On admission 123, his ranges usually greater than 135. Started on IV fluids sodium is improving nicely continue IV fluids at a lower rate, recheck basic metabolic panel this afternoon and tomorrow morning.  Chronic systolic heart failure with a last EF of 30% in October 2025/ischemic cardiomyopathy: Continue Coreg, aspirin and Plavix Hold Entresto, torsemide and Aldactone due to acute kidney injury.  Acute metabolic encephalopathy Likely multifactorial in the setting of hypovolemia and infectious etiology.  Uncontrolled type 2 diabetes mellitus with hyperglycemia, with long-term current use of insulin  Continue NovoLog 70/30, 30 units twice a day, plus correction scale. His blood glucose is improving.  BPH: Continue Flomax.  Dyslipidemia Continue statins.  Essential hypertension: Pressure stable, continue Coreg, hold Entresto, torsemide and Aldactone in the setting of acute kidney injury. Use hydralazine IV as needed.   DVT prophylaxis: lovenox Family Communication:wife Status is: Inpatient Remains inpatient appropriate because: Sepsis due to cellulitis in the setting of ischemic right foot ulcer    Code Status:     Code Status Orders  (From admission, onward)           Start     Ordered   04/19/23 0227  Do not attempt resuscitation (DNR)- Limited -Do Not Intubate (DNI)  Continuous       Question Answer Comment  If pulseless and not breathing No CPR or chest compressions.   In Pre-Arrest Conditions (Patient Is Breathing and Has A Pulse) Do not intubate. Provide all appropriate non-invasive medical interventions. Avoid ICU transfer unless indicated or required.   Consent: Discussion  documented in EHR or advanced directives reviewed      04/19/23 0228           Code Status History     Date Active Date Inactive Code Status Order ID Comments User Context   02/14/2023 2011 02/19/2023 2103 Limited: Do not attempt resuscitation (DNR) -DNR-LIMITED -Do Not Intubate/DNI  [id number]  Verdene Lennert, MD ED   01/18/2023 1828 01/19/2023 1832 Limited: Do not attempt resuscitation (DNR) -DNR-LIMITED -Do Not Intubate/DNI  [id number]  Theotis Burrow, NP Inpatient   01/17/2023 0841 01/18/2023 1828 Do not attempt resuscitation (DNR) PRE-ARREST INTERVENTIONS DESIRED [id number]  Gillis Santa, MD Inpatient   01/15/2023 1107 01/17/2023 0840 Full Code [id number]  Floydene Flock, MD ED   11/22/2022 1557 11/26/2022 1830 DNR [id number]  Theotis Burrow, NP Inpatient   11/10/2022 1119 11/22/2022 1556 Full Code [id number]  Floydene Flock, MD ED   06/13/2021 0040 06/17/2021 1803 Full Code [id number]  Andris Baumann, MD ED   05/14/2021 1345 05/14/2021 2028 Full Code [id number]  Annice Needy, MD Inpatient         IV Access:   Peripheral IV   Procedures and diagnostic studies:   US Venous Img Lower Right (DVT Study) Result Date: 04/18/2023 CLINICAL DATA:  Right lower extremity swelling. EXAM: Right LOWER EXTREMITY VENOUS DOPPLER ULTRASOUND TECHNIQUE: Gray-scale sonography with compression, as well as color and duplex ultrasound, were performed to evaluate the deep venous system(s) from the level of the common femoral vein through the popliteal and proximal calf veins. COMPARISON:  None Available. FINDINGS: VENOUS Normal compressibility of the common femoral, superficial femoral, and popliteal veins, as well as the visualized calf veins. Visualized portions of profunda femoral vein and great saphenous vein unremarkable. No filling defects to suggest DVT on grayscale or color Doppler imaging. Doppler waveforms show normal direction of venous flow, normal respiratory plasticity and response to augmentation.  Limited views of the contralateral common femoral vein are unremarkable. OTHER There is subcutaneous edema. Limitations: none IMPRESSION: No DVT identified. Electronically Signed   By: Elgie Collard M.D.   On: 04/18/2023 19:13     Medical Consultants:   None.   Subjective:    Manuel [name] he relates his pain is not controlled.  Would like something a lot stronger for his pain.  Objective:    Vitals:   04/19/23 0500 04/19/23 0530 04/19/23 0600 04/19/23 0730  BP: (!) 111/93 (!) 109/95 (!) 109/95 (!) 145/70  Pulse: 87 87 81 81  Resp: 19 16 20 15   Temp:   99 F (37.2 C)   TempSrc:      SpO2: 99% 95% 97% 98%  Weight:      Height:       SpO2: 98 %   Intake/Output Summary (Last 24 hours) at 04/19/2023 0819 Last data filed at 04/19/2023 0438 Gross per 24 hour  Intake 496.73 ml  Output --  Net 496.73 ml   Filed Weights   04/18/23 1755  Weight: 83.9 kg    Exam: General exam: In no acute distress. Respiratory system: Good air movement and clear to auscultation. Cardiovascular system: S1 & S2 heard, RRR. No JVD. Gastrointestinal system:  Abdomen is nondistended, soft and nontender.  Central nervous system: Alert and oriented. No focal neurological deficits. Extremities: No pedal edema. Skin:  Psychiatry: Judgement and insight appear normal. Mood & affect appropriate.    Data Reviewed:    Labs: Basic Metabolic Panel: Recent Labs  Lab 04/15/23 1432 04/18/23 1759 04/19/23 0442  NA 126* 123* 126*  K 5.1 5.0 4.8  CL 92* 90* 98  CO2 21* 21* 18*  GLUCOSE 215* 249* 190*  BUN 48* 71* 67*  CREATININE 1.51* 1.98* 1.71*  CALCIUM 8.8* 8.7* 8.1*   GFR Estimated Creatinine Clearance: 37 mL/min (A) (by C-G formula based on SCr of 1.71 mg/dL (H)). Liver Function Tests: Recent Labs  Lab 04/18/23 1759  AST 55*  ALT 48*  ALKPHOS 112  BILITOT 0.6  PROT 7.2  ALBUMIN 3.2*   No results for input(s): "LIPASE", "AMYLASE" in the last 168 hours. No results for  input(s): "AMMONIA" in the last 168 hours. Coagulation profile Recent Labs  Lab 04/19/23 0442  INR 1.4*   COVID-19 Labs  No results for input(s): "DDIMER", "FERRITIN", "LDH", "CRP" in the last 72 hours.  Lab Results  Component Value Date   SARSCOV2NAA NEGATIVE 02/14/2023   SARSCOV2NAA NEGATIVE 06/12/2021   SARSCOV2NAA NEGATIVE 12/04/2018    CBC: Recent Labs  Lab 04/15/23 1432 04/18/23 1759 04/19/23 0442  WBC 11.6* 16.5* 15.6*  HGB 12.1* 11.7* 10.3*  HCT 35.1* 34.0* 30.1*  MCV 85.0 83.5 83.1  PLT 233 296 293   Cardiac Enzymes: No results for input(s): "CKTOTAL", "CKMB", "CKMBINDEX", "TROPONINI" in the last 168 hours. BNP (last 3 results) No results for input(s): "PROBNP" in the last 8760 hours. CBG: Recent Labs  Lab 04/19/23 0751  GLUCAP 152*   D-Dimer: No results for input(s): "DDIMER" in the last 72 hours. Hgb A1c: No results for input(s): "HGBA1C" in the last 72 hours. Lipid Profile: No results for input(s): "CHOL", "HDL", "LDLCALC", "TRIG", "CHOLHDL", "LDLDIRECT" in the last 72 hours. Thyroid function studies: Recent Labs    04/19/23 0442  TSH 1.416   Anemia work up: No results for input(s): "VITAMINB12", "FOLATE", "FERRITIN", "TIBC", "IRON", "RETICCTPCT" in the last 72 hours. Sepsis Labs: Recent Labs  Lab 04/15/23 1432 04/18/23 1758 04/18/23 1759 04/19/23 0150 04/19/23 0442  PROCALCITON  --   --   --  2.13  --   WBC 11.6*  --  16.5*  --  15.6*  LATICACIDVEN 0.8 1.1  --   --   --    Microbiology Recent Results (from the past 240 hours)  Blood culture (routine x 2)     Status: None (Preliminary result)   Collection Time: 04/19/23  1:50 AM   Specimen: BLOOD  Result Value Ref Range Status   Specimen Description BLOOD BLOOD LEFT ARM  Final   Special Requests   Final    BOTTLES DRAWN AEROBIC AND ANAEROBIC Blood Culture results may not be optimal due to an inadequate volume of blood received in culture bottles   Culture   Final    NO GROWTH <  12 HOURS Performed at [facility], [address] [place], [place] [address]    Report Status PENDING  Incomplete  Blood culture (routine x 2)     Status: None (Preliminary result)   Collection Time: 04/19/23  1:56 AM   Specimen: BLOOD  Result Value Ref Range Status   Specimen Description BLOOD BLOOD RIGHT ARM  Final   Special Requests   Final    BOTTLES DRAWN  AEROBIC AND ANAEROBIC Blood Culture adequate volume   Culture   Final    NO GROWTH < 12 HOURS Performed at [facility], [address] [place], [place] [address]    Report Status PENDING  Incomplete     Medications:    aspirin EC  81 mg Oral Daily   atorvastatin  80 mg Oral QHS   carvedilol  25 mg Oral BID WC   clopidogrel  75 mg Oral Daily   dapagliflozin propanediol  10 mg Oral Daily   enoxaparin (LOVENOX) injection  40 mg Subcutaneous Q24H   folic acid  1 mg Oral Daily   insulin aspart  0-20 Units Subcutaneous TID WC   insulin aspart  0-5 Units Subcutaneous QHS   insulin aspart protamine- aspart  30 Units Subcutaneous BID WC   iron polysaccharides  150 mg Oral Daily   mirtazapine  15 mg Oral QHS   multivitamin with minerals  1 tablet Oral Daily   pregabalin  75 mg Oral BID   Ensure Max Protein  11 oz Oral Daily   tamsulosin  0.4 mg Oral QPC supper   thiamine  100 mg Oral Daily   Continuous Infusions:  sodium chloride 150 mL/hr at 04/19/23 0214   cefTRIAXone (ROCEPHIN)  IV     lactated ringers     vancomycin        LOS: 0 days   Marinda Elk  [facility]  04/19/2023, 8:19 AM

## 2023-04-19 NOTE — Assessment & Plan Note (Addendum)
05-07-2023 stable. On aldactone 50 mg qdaily, entresto 24-26 bid, coreg 25 mg bid, farxiga 10 mg daily  05-08-2023 stable.

## 2023-04-19 NOTE — ED Provider Notes (Signed)
[facility] Provider Note    Event Date/Time   First MD Initiated Contact with Patient 04/19/23 0013     (approximate)   History   Altered Mental Status, Weakness, Fall, and Leg Swelling   HPI  Manuel [name] is a 77 y.o. male who presents to the ED for evaluation of Altered Mental Status, Weakness, Fall, and Leg Swelling   Review ED visit from 12/24.  Seen for a fall onto his right knee causing effusion and pain.  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  Patient presents to the ED alongside his wife for evaluation of worsening confusion and right leg pain.  Physical Exam   Triage Vital Signs: ED Triage Vitals  Encounter Vitals Group     BP 04/18/23 1754 121/72     Systolic BP Percentile --      Diastolic BP Percentile --      Pulse Rate 04/18/23 1754 72     Resp 04/18/23 1754 14     Temp 04/18/23 1754 98.4 F (36.9 C)     Temp Source 04/18/23 1754 Oral     SpO2 04/18/23 1754 96 %     Weight 04/18/23 1755 184 lb 15.5 oz (83.9 kg)     Height 04/18/23 1755 5\' 8"  (1.727 m)     Head Circumference --      Peak Flow --      Pain Score 04/18/23 1755 6     Pain Loc --      Pain Education --      Exclude from Growth Chart --     Most recent vital signs: Vitals:   04/19/23 0039 04/19/23 0200  BP: (!) 149/85 (!) 135/121  Pulse: 76 83  Resp: 13 (!) 22  Temp: 99.3 F (37.4 C) 98.8 F (37.1 C)  SpO2: 96% 97%    General: Awake, no distress.  CV:  Good peripheral perfusion.  Resp:  Normal effort.  Abd:  No distention.  MSK:  No deformity noted.  Neuro:  No focal deficits appreciated. Other:  Ulcerative lesion to his TMA site.  Soft tissue swelling around the right knee.  Redness to the right lower leg   ED Results / Procedures / Treatments   Labs (all labs ordered are listed, but only abnormal results are displayed) Labs Reviewed  COMPREHENSIVE METABOLIC PANEL -  Abnormal; Notable for the following components:      Result Value   Sodium 123 (*)    Chloride 90 (*)    CO2 21 (*)    Glucose, Bld 249 (*)    BUN 71 (*)    Creatinine, Ser 1.98 (*)    Calcium 8.7 (*)    Albumin 3.2 (*)    AST 55 (*)    ALT 48 (*)    GFR, Estimated 34 (*)    All other components within normal limits  CBC - Abnormal; Notable for the following components:   WBC 16.5 (*)    RBC 4.07 (*)    Hemoglobin 11.7 (*)    HCT 34.0 (*)    RDW 16.3 (*)    All other components within normal limits  URINALYSIS, ROUTINE W REFLEX MICROSCOPIC - Abnormal; Notable for the following components:   Color, Urine STRAW (*)    APPearance CLEAR (*)    Glucose, UA >=500 (*)    Hgb urine dipstick SMALL (*)  All other components within normal limits  CULTURE, BLOOD (ROUTINE X 2)  CULTURE, BLOOD (ROUTINE X 2)  LACTIC ACID, PLASMA  PROCALCITONIN    EKG   RADIOLOGY Venous ultrasound interpreted by me without DVT  Official radiology report(s): US Venous Img Lower Right (DVT Study) Result Date: 04/18/2023 CLINICAL DATA:  Right lower extremity swelling. EXAM: Right LOWER EXTREMITY VENOUS DOPPLER ULTRASOUND TECHNIQUE: Gray-scale sonography with compression, as well as color and duplex ultrasound, were performed to evaluate the deep venous system(s) from the level of the common femoral vein through the popliteal and proximal calf veins. COMPARISON:  None Available. FINDINGS: VENOUS Normal compressibility of the common femoral, superficial femoral, and popliteal veins, as well as the visualized calf veins. Visualized portions of profunda femoral vein and great saphenous vein unremarkable. No filling defects to suggest DVT on grayscale or color Doppler imaging. Doppler waveforms show normal direction of venous flow, normal respiratory plasticity and response to augmentation. Limited views of the contralateral common femoral vein are unremarkable. OTHER There is subcutaneous edema. Limitations:  none IMPRESSION: No DVT identified. Electronically Signed   By: Elgie Collard M.D.   On: 04/18/2023 19:13    PROCEDURES and INTERVENTIONS:  .Critical Care  Performed by: Delton Prairie, MD Authorized by: Delton Prairie, MD   Critical care provider statement:    Critical care time (minutes):  30   Critical care time was exclusive of:  Separately billable procedures and treating other patients   Critical care was necessary to treat or prevent imminent or life-threatening deterioration of the following conditions:  Sepsis   Critical care was time spent personally by me on the following activities:  Development of treatment plan with patient or surrogate, discussions with consultants, evaluation of patient's response to treatment, examination of patient, ordering and review of laboratory studies, ordering and review of radiographic studies, ordering and performing treatments and interventions, pulse oximetry, re-evaluation of patient's condition and review of old charts .1-3 Lead EKG Interpretation  Performed by: Delton Prairie, MD Authorized by: Delton Prairie, MD     Interpretation: normal     ECG rate:  80   ECG rate assessment: normal     Rhythm: sinus rhythm     Ectopy: none     Conduction: normal     Medications  ceFEPIme (MAXIPIME) 2 g in sodium chloride 0.9 % 100 mL IVPB (2 g Intravenous New Bag/Given 04/19/23 0205)  Vancomycin (VANCOCIN) 1,500 mg in sodium chloride 0.9 % 500 mL IVPB (has no administration in time range)  0.9 %  sodium chloride infusion ( Intravenous New Bag/Given 04/19/23 0214)  sodium chloride 0.9 % bolus 1,000 mL (0 mLs Intravenous Stopped 04/19/23 0158)  HYDROmorphone (DILAUDID) injection 1 mg (1 mg Intravenous Given 04/19/23 0201)     IMPRESSION / MDM / ASSESSMENT AND PLAN / ED COURSE  I reviewed the triage vital signs and the nursing notes.  Differential diagnosis includes, but is not limited to, stroke, ICH, metabolic encephalopathy, AKI, sepsis,  osteomyelitis  {Patient presents with symptoms of an acute illness or injury that is potentially life-threatening.  Patient presents to the ED for evaluation of increasing altered mentation and right leg pain.  Evidence of sepsis from cellulitis or wound infection of his TMA site.  Metabolic derangements with hyponatremia and AKI.  Leukocytosis.  Normal lactic acid and urine.  Cultures drawn and he is provided broad-spectrum antibiotics.  Consult medicine for admission.  Clinical Course as of 04/19/23 6045  Sat Apr 19, 2023  0206  Consult with medicine who agrees to admit [DS]    Clinical Course User Index [DS] Delton Prairie, MD     FINAL CLINICAL IMPRESSION(S) / ED DIAGNOSES   Final diagnoses:  Hyponatremia  AKI (acute kidney injury) (HCC)  Wound infection     Rx / DC Orders   ED Discharge Orders     None        Note:  This document was prepared using Dragon voice recognition software and may include unintentional dictation errors.   Delton Prairie, MD 04/19/23 [phone]

## 2023-04-19 NOTE — ED Notes (Signed)
Pt to MRI

## 2023-04-20 ENCOUNTER — Inpatient Hospital Stay: Payer: Medicare Other

## 2023-04-20 ENCOUNTER — Other Ambulatory Visit: Payer: Self-pay

## 2023-04-20 ENCOUNTER — Inpatient Hospital Stay: Payer: Medicare Other | Admitting: Anesthesiology

## 2023-04-20 ENCOUNTER — Encounter
Admission: EM | Disposition: A | Discharge status: Skilled Nursing Facility | Payer: Self-pay | Source: Home / Self Care | Attending: Internal Medicine

## 2023-04-20 DIAGNOSIS — L0390 Cellulitis, unspecified: Secondary | ICD-10-CM | POA: Diagnosis not present

## 2023-04-20 DIAGNOSIS — A419 Sepsis, unspecified organism: Secondary | ICD-10-CM | POA: Diagnosis not present

## 2023-04-20 DIAGNOSIS — M25561 Pain in right knee: Secondary | ICD-10-CM

## 2023-04-20 DIAGNOSIS — I999 Unspecified disorder of circulatory system: Secondary | ICD-10-CM

## 2023-04-20 DIAGNOSIS — Z89511 Acquired absence of right leg below knee: Secondary | ICD-10-CM

## 2023-04-20 HISTORY — PX: AMPUTATION: SHX166

## 2023-04-20 LAB — GLUCOSE, CAPILLARY
Glucose-Capillary: 90 mg/dL (ref 70–99)
Glucose-Capillary: 77 mg/dL (ref 70–99)
Glucose-Capillary: 256 mg/dL — ABNORMAL HIGH (ref 70–99)
Glucose-Capillary: 214 mg/dL — ABNORMAL HIGH (ref 70–99)

## 2023-04-20 LAB — SYNOVIAL CELL COUNT + DIFF, W/ CRYSTALS
Eosinophils-Synovial: 0 %
Lymphocytes-Synovial Fld: 2 %
Monocyte-Macrophage-Synovial Fluid: 3 %
Neutrophil, Synovial: 95 %
WBC, Synovial: 124737 /mm3 — ABNORMAL HIGH (ref 0–200)

## 2023-04-20 LAB — BASIC METABOLIC PANEL
Anion gap: 9 (ref 5–15)
BUN: 50 mg/dL — ABNORMAL HIGH (ref 8–23)
CO2: 19 mmol/L — ABNORMAL LOW (ref 22–32)
Calcium: 8.3 mg/dL — ABNORMAL LOW (ref 8.9–10.3)
Chloride: 102 mmol/L (ref 98–111)
Creatinine, Ser: 1.19 mg/dL (ref 0.61–1.24)
GFR, Estimated: >60 mL/min (ref >60)
Glucose, Bld: 81 mg/dL (ref 70–99)
Potassium: 4.6 mmol/L (ref 3.5–5.1)
Sodium: 130 mmol/L — ABNORMAL LOW (ref 135–145)

## 2023-04-20 LAB — SURGICAL PCR SCREEN
MRSA, PCR: NEGATIVE
Staphylococcus aureus: NEGATIVE

## 2023-04-20 LAB — VANCOMYCIN, RANDOM: Vancomycin Rm: 11 ug/mL

## 2023-04-20 SURGERY — AMPUTATION BELOW KNEE
Anesthesia: General | Site: Knee | Laterality: Right

## 2023-04-20 MED ORDER — MIDAZOLAM HCL 2 MG/2ML IJ SOLN
INTRAMUSCULAR | Status: DC | PRN
  Administered 2023-04-20: 2 mg via INTRAVENOUS

## 2023-04-20 MED ORDER — PROPOFOL 10 MG/ML IV BOLUS
INTRAVENOUS | Status: DC | PRN
  Administered 2023-04-20: 50 mg via INTRAVENOUS

## 2023-04-20 MED ORDER — BUPIVACAINE LIPOSOME 1.3 % IJ SUSP
INTRAMUSCULAR | Status: AC
  Filled 2023-04-20: qty 20

## 2023-04-20 MED ORDER — CEFAZOLIN SODIUM-DEXTROSE 2-3 GM-%(50ML) IV SOLR
INTRAVENOUS | Status: DC | PRN
  Administered 2023-04-20: 2 g via INTRAVENOUS

## 2023-04-20 MED ORDER — BUPIVACAINE LIPOSOME 1.3 % IJ SUSP
INTRAMUSCULAR | Status: DC | PRN
  Administered 2023-04-20 (×2): 10 mL via PERINEURAL

## 2023-04-20 MED ORDER — BUPIVACAINE HCL (PF) 0.5 % IJ SOLN: INTRAMUSCULAR | Status: DC | PRN

## 2023-04-20 MED ORDER — BUPIVACAINE HCL (PF) 0.5 % IJ SOLN
INTRAMUSCULAR | Status: AC
  Filled 2023-04-20: qty 20

## 2023-04-20 MED ORDER — PHENYLEPHRINE 80 MCG/ML (10ML) SYRINGE FOR IV PUSH (FOR BLOOD PRESSURE SUPPORT)
PREFILLED_SYRINGE | INTRAVENOUS | Status: DC | PRN
  Administered 2023-04-20 (×2): 80 ug via INTRAVENOUS
  Administered 2023-04-20 (×2): 160 ug via INTRAVENOUS
  Administered 2023-04-20: 80 ug via INTRAVENOUS

## 2023-04-20 MED ORDER — EPHEDRINE 5 MG/ML INJ
INTRAVENOUS | Status: AC
Start: 2023-04-20 — End: ?
  Filled 2023-04-20: qty 10

## 2023-04-20 MED ORDER — PROPOFOL 500 MG/50ML IV EMUL
INTRAVENOUS | Status: DC | PRN
  Administered 2023-04-20: 60 ug/kg/min via INTRAVENOUS

## 2023-04-20 MED ORDER — BUPIVACAINE HCL (PF) 0.5 % IJ SOLN
INTRAMUSCULAR | Status: DC | PRN
  Administered 2023-04-20 (×2): 10 mL via PERINEURAL

## 2023-04-20 MED ORDER — FENTANYL CITRATE (PF) 100 MCG/2ML IJ SOLN
INTRAMUSCULAR | Status: DC | PRN
  Administered 2023-04-20: 50 ug via INTRAVENOUS

## 2023-04-20 MED ORDER — MIDAZOLAM HCL 2 MG/2ML IJ SOLN
INTRAMUSCULAR | Status: AC
  Filled 2023-04-20: qty 2

## 2023-04-20 MED ORDER — VANCOMYCIN HCL 1250 MG/250ML IV SOLN
1250.0000 mg | INTRAVENOUS | Status: DC
  Administered 2023-04-20 – 2023-04-22 (×3): 1250 mg via INTRAVENOUS
  Filled 2023-04-20 (×4): qty 250

## 2023-04-20 MED ORDER — FENTANYL CITRATE (PF) 100 MCG/2ML IJ SOLN
INTRAMUSCULAR | Status: AC
  Filled 2023-04-20: qty 2

## 2023-04-20 MED ORDER — SODIUM CHLORIDE 0.9 % IV SOLN: INTRAVENOUS | Status: DC | PRN

## 2023-04-20 MED ORDER — DEXMEDETOMIDINE HCL IN NACL 80 MCG/20ML IV SOLN
INTRAVENOUS | Status: DC | PRN
  Administered 2023-04-20 (×2): 4 ug via INTRAVENOUS

## 2023-04-20 MED ORDER — FENTANYL CITRATE PF 50 MCG/ML IJ SOSY
PREFILLED_SYRINGE | INTRAMUSCULAR | Status: AC
  Filled 2023-04-20: qty 1

## 2023-04-20 MED ORDER — EPHEDRINE SULFATE-NACL 50-0.9 MG/10ML-% IV SOSY
PREFILLED_SYRINGE | INTRAVENOUS | Status: DC | PRN
  Administered 2023-04-20 (×3): 5 mg via INTRAVENOUS
  Administered 2023-04-20: 10 mg via INTRAVENOUS
  Administered 2023-04-20: 5 mg via INTRAVENOUS

## 2023-04-20 MED ORDER — PHENYLEPHRINE 80 MCG/ML (10ML) SYRINGE FOR IV PUSH (FOR BLOOD PRESSURE SUPPORT)
PREFILLED_SYRINGE | INTRAVENOUS | Status: AC
  Filled 2023-04-20: qty 10

## 2023-04-20 MED ORDER — MUPIROCIN 2 % EX OINT
1.0000 | TOPICAL_OINTMENT | Freq: Two times a day (BID) | CUTANEOUS | Status: AC
  Administered 2023-04-20 – 2023-04-24 (×10): 1 via NASAL
  Filled 2023-04-20: qty 22

## 2023-04-20 MED ORDER — LIDOCAINE HCL (PF) 2 % IJ SOLN
INTRAMUSCULAR | Status: AC
  Filled 2023-04-20: qty 5

## 2023-04-20 MED ORDER — 0.9 % SODIUM CHLORIDE (POUR BTL) OPTIME
TOPICAL | Status: DC | PRN
  Administered 2023-04-20: 1000 mL

## 2023-04-20 SURGICAL SUPPLY — 38 items
BLADE SAGITTAL WIDE XTHICK NO (BLADE) IMPLANT
BLADE SAW SAG 25.4X90 (BLADE) ×1 IMPLANT
BNDG COHESIVE 4X5 TAN STRL LF (GAUZE/BANDAGES/DRESSINGS) ×1 IMPLANT
BNDG ELASTIC 6INX 5YD STR LF (GAUZE/BANDAGES/DRESSINGS) IMPLANT
BNDG ELASTIC 6X5.8 VLCR NS LF (GAUZE/BANDAGES/DRESSINGS) ×1 IMPLANT
BNDG GAUZE DERMACEA FLUFF 4 (GAUZE/BANDAGES/DRESSINGS) ×2 IMPLANT
BNDG STRETCH 4X75 STRL LF (GAUZE/BANDAGES/DRESSINGS) IMPLANT
BRUSH SCRUB EZ 4% CHG (MISCELLANEOUS) ×1 IMPLANT
CHLORAPREP W/TINT 26 (MISCELLANEOUS) ×1 IMPLANT
DRAPE INCISE IOBAN 66X45 STRL (DRAPES) IMPLANT
DRSG XEROFORM 1X8 (GAUZE/BANDAGES/DRESSINGS) IMPLANT
ELECT CAUTERY BLADE 6.4 (BLADE) ×1 IMPLANT
ELECT REM PT RETURN 9FT ADLT (ELECTROSURGICAL) ×1 IMPLANT
ELECTRODE REM PT RTRN 9FT ADLT (ELECTROSURGICAL) ×1 IMPLANT
GAUZE XEROFORM 1X8 LF (GAUZE/BANDAGES/DRESSINGS) ×2 IMPLANT
GLOVE BIO SURGEON STRL SZ7 (GLOVE) ×2 IMPLANT
GLOVE BIOGEL PI IND STRL 7.0 (GLOVE) IMPLANT
GOWN STRL REUS W/ TWL LRG LVL3 (GOWN DISPOSABLE) ×2 IMPLANT
GOWN STRL REUS W/TWL 2XL LVL3 (GOWN DISPOSABLE) ×1 IMPLANT
HANDLE YANKAUER SUCT BULB TIP (MISCELLANEOUS) ×1 IMPLANT
KIT TURNOVER KIT A (KITS) ×1 IMPLANT
LABEL OR SOLS (LABEL) ×1 IMPLANT
MANIFOLD NEPTUNE II (INSTRUMENTS) ×1 IMPLANT
MAT ABSORB FLUID 56X50 GRAY (MISCELLANEOUS) ×1 IMPLANT
NS IRRIG 1000ML POUR BTL (IV SOLUTION) ×1 IMPLANT
PACK EXTREMITY ARMC (MISCELLANEOUS) ×1 IMPLANT
PAD ABD DERMACEA PRESS 5X9 (GAUZE/BANDAGES/DRESSINGS) ×2 IMPLANT
PAD PREP OB/GYN DISP 24X41 (PERSONAL CARE ITEMS) ×1 IMPLANT
SPONGE T-LAP 18X18 ~~LOC~~+RFID (SPONGE) ×1 IMPLANT
STAPLER SKIN PROX 35W (STAPLE) ×1 IMPLANT
STOCKINETTE M/LG 89821 (MISCELLANEOUS) ×1 IMPLANT
SUT SILK 2 0 SH (SUTURE) ×2 IMPLANT
SUT SILK 2-0 18XBRD TIE 12 (SUTURE) ×1 IMPLANT
SUT SILK 3-0 18XBRD TIE 12 (SUTURE) ×1 IMPLANT
SUT VIC AB 0 CT1 36 (SUTURE) ×2 IMPLANT
SUT VIC AB 2-0 CT1 (SUTURE) ×2 IMPLANT
TRAP FLUID SMOKE EVACUATOR (MISCELLANEOUS) ×1 IMPLANT
WATER STERILE IRR 500ML POUR (IV SOLUTION) ×1 IMPLANT

## 2023-04-20 NOTE — Progress Notes (Signed)
[facility] PROGRESS NOTE    Progress Note  ZAAKIR KIVI  WJX:[id number] DOB: 02-24-1946 DOA: 04/18/2023 PCP: Gracelyn Nurse, MD     Brief Narrative:   Manuel [name] is an 77 y.o. 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 Kenard Gower and the right fourth and fifth partial ray resection on 11/18/2022 by Dr. Alberteen Spindle  Was seen in the ER 4 days prior to admission was placed on Bactrim and Keflex.  who comes in to the ED with acute onset of acute encephalopathy he relates his ulcer in the foot has been draining since then comes in with pain and swelling as well as erythema along with acute kidney injury decreased oral intake.   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 Blood cultures have remained negative till date. Podiatry was consulted, recommended surgical intervention and family elected for proximal amputation. Recommended n.p.o. intermittent night continue IV antibiotics.  Acute kidney injury superimposed on chronic kidney disease (HCC) Likely prerenal azotemia. With a baseline creatinine of less than 1, on admission 1.9. Creatinine returned to baseline with IV fluids. KVO IV fluids as he has an EF of 30%.  Possible acute gout flare of the right knee and ankle Exquisitely tender to palpation when I remove the sheets from the knee he started yelling in pain. Continue colchicine. Orthopedic surgery was consulted aspiration of knee to rule out gout awaiting further recommendations.  Hypovolemic hyponatremia On admission 123, his ranges usually greater than 135. Improved with IV  fluids.  Chronic systolic heart failure with a last EF of 30% in October 2025/ischemic cardiomyopathy: Continue Coreg, aspirin and Plavix Need to hold Entresto, torsemide and Aldactone due to acute kidney injury.  Acute metabolic encephalopathy Likely multifactorial in the setting of hypovolemia and infectious etiology.  Uncontrolled type 2 diabetes mellitus with hyperglycemia, with long-term current use of insulin  Continue NovoLog 70/30, 30 units twice a day, plus correction scale. Blood glucose improved. Hold 7030 until he is able to tolerate his diet postsurgical procedure.  BPH: Continue Flomax.  Dyslipidemia Continue statins.  Essential hypertension: Pressure stable, continue Coreg, hold Entresto, torsemide and Aldactone in the setting of acute kidney injury. Use hydralazine IV as needed.   DVT prophylaxis: lovenox Family Communication:wife Status is: Inpatient Remains inpatient appropriate because: Sepsis due to cellulitis in the setting of ischemic right foot ulcer    Code Status:     Code Status Orders  (From admission, onward)           Start     Ordered   04/19/23 0227  Do not attempt resuscitation (DNR)- Limited -Do Not Intubate (DNI)  Continuous       Question Answer Comment  If pulseless and not breathing No CPR or chest compressions.   In Pre-Arrest Conditions (Patient Is Breathing and Has A Pulse) Do not intubate. Provide all appropriate non-invasive medical interventions. Avoid ICU transfer unless indicated or required.   Consent: Discussion documented in EHR or advanced directives reviewed      04/19/23 0228           Code Status History     Date Active Date Inactive Code Status  Order ID Comments User Context   02/14/2023 2011 02/19/2023 2103 Limited: Do not attempt resuscitation (DNR) -DNR-LIMITED -Do Not Intubate/DNI  [id number]  Verdene Lennert, MD ED   01/18/2023 1828 01/19/2023 1832 Limited: Do not attempt resuscitation (DNR)  -DNR-LIMITED -Do Not Intubate/DNI  [id number]  Theotis Burrow, NP Inpatient   01/17/2023 0841 01/18/2023 1828 Do not attempt resuscitation (DNR) PRE-ARREST INTERVENTIONS DESIRED [id number]  Gillis Santa, MD Inpatient   01/15/2023 1107 01/17/2023 0840 Full Code [id number]  Floydene Flock, MD ED   11/22/2022 1557 11/26/2022 1830 DNR [id number]  Theotis Burrow, NP Inpatient   11/10/2022 1119 11/22/2022 1556 Full Code [id number]  Floydene Flock, MD ED   06/13/2021 0040 06/17/2021 1803 Full Code [id number]  Andris Baumann, MD ED   05/14/2021 1345 05/14/2021 2028 Full Code [id number]  Annice Needy, MD Inpatient         IV Access:   Peripheral IV   Procedures and diagnostic studies:   MR FOOT RIGHT WO CONTRAST Result Date: 04/19/2023 CLINICAL DATA:  Osteomyelitis, foot EXAM: MRI OF THE RIGHT FOREFOOT WITHOUT CONTRAST TECHNIQUE: Multiplanar, multisequence MR imaging of the right foot was performed. No intravenous contrast was administered. COMPARISON:  X-ray 04/19/2023, 02/17/2023 FINDINGS: Bones/Joint/Cartilage Postsurgical changes following transmetatarsal amputation of the first through fifth rays. Bone marrow edema is present throughout the residual first through fourth metatarsals with intermediate to low T1 marrow signal. No signal abnormality within the residual fifth metatarsal. No tarsometatarsal joint effusion. Preserved marrow signal within the hindfoot and midfoot. No fracture or dislocation. Ligaments Intact Lisfranc ligament. Muscles and Tendons Amputation changes of the musculotendinous structures of the forefoot. Diffuse edema-like signal of the foot musculature which may represent changes related to denervation and/or myositis. No tenosynovitis. Soft tissues Shallow soft tissue wound or ulceration at the medial aspect of the distal stump with sinus tracks extending to the resection margins of the first and second metatarsals. Small amount of fluid surrounds both the first and second metatarsal  distally. Susceptibility artifact within the tissues adjacent to the first metatarsal suggest soft tissue gas. Generalized soft tissue swelling with subcutaneous edema. IMPRESSION: 1. Prior transmetatarsal amputation of the right foot with findings of acute osteomyelitis involving the first through fourth metatarsals. 2. Shallow soft tissue wound or ulceration at the medial aspect of the distal stump with sinus tracks extending to the resection margins of the first and second metatarsals. Small amount of fluid surrounds both the first and second metatarsal distally. Susceptibility artifact within the tissues adjacent to the first metatarsal suggest soft tissue gas. Electronically Signed   By: Duanne Guess D.O.   On: 04/19/2023 14:13   DG Foot Complete Right Result Date: 04/19/2023 CLINICAL DATA:  Severe right foot cellulitis.  Previous amputation. EXAM: RIGHT FOOT COMPLETE - 3+ VIEW COMPARISON:  None Available. FINDINGS: Previous amputation of all digits is seen at the level of the mid metatarsals. Prominent distal soft tissue swelling is seen. Osteolysis and periostitis is seen involving the 1st and 4th metatarsals, highly suspicious for osteomyelitis. IMPRESSION: Findings highly suspicious for osteomyelitis involving the 1st and 4th metatarsals. Electronically Signed   By: Danae Orleans M.D.   On: 04/19/2023 11:21   US Venous Img Lower Right (DVT Study) Result Date: 04/18/2023 CLINICAL DATA:  Right lower extremity swelling. EXAM: Right LOWER EXTREMITY VENOUS DOPPLER ULTRASOUND TECHNIQUE: Gray-scale sonography with compression, as well as color and duplex ultrasound, were performed to evaluate the deep venous system(s) from the level of the common femoral  vein through the popliteal and proximal calf veins. COMPARISON:  None Available. FINDINGS: VENOUS Normal compressibility of the common femoral, superficial femoral, and popliteal veins, as well as the visualized calf veins. Visualized portions of  profunda femoral vein and great saphenous vein unremarkable. No filling defects to suggest DVT on grayscale or color Doppler imaging. Doppler waveforms show normal direction of venous flow, normal respiratory plasticity and response to augmentation. Limited views of the contralateral common femoral vein are unremarkable. OTHER There is subcutaneous edema. Limitations: none IMPRESSION: No DVT identified. Electronically Signed   By: Elgie Collard M.D.   On: 04/18/2023 19:13     Medical Consultants:   None.   Subjective:    Edyth Gunnels Goodsonpain in knee not improved.  Objective:    Vitals:   04/19/23 1800 04/19/23 1813 04/19/23 2000 04/19/23 2149  BP: 117/77  118/67 138/76  Pulse: 71  75 74  Resp: 16  (!) 22 18  Temp:  98.5 F (36.9 C)  97.9 F (36.6 C)  TempSrc:  Axillary  Oral  SpO2: 96%  90% 97%  Weight:      Height:       SpO2: 97 %   Intake/Output Summary (Last 24 hours) at 04/20/2023 0708 Last data filed at 04/19/2023 1439 Gross per 24 hour  Intake 100 ml  Output --  Net 100 ml   Filed Weights   04/18/23 1755  Weight: 83.9 kg    Exam: General exam: In no acute distress. Respiratory system: Good air movement and clear to auscultation. Cardiovascular system: S1 & S2 heard, RRR. No JVD. Gastrointestinal system: Abdomen is nondistended, soft and nontender.  Central nervous system: Alert and oriented. No focal neurological deficits. Extremities: No pedal edema. Skin:  Psychiatry: Judgement and insight appear normal. Mood & affect appropriate.    Data Reviewed:    Labs: Basic Metabolic Panel: Recent Labs  Lab 04/15/23 1432 04/18/23 1759 04/19/23 0442 04/19/23 1745 04/20/23 0424  NA 126* 123* 126* 129* 130*  K 5.1 5.0 4.8 4.8 4.6  CL 92* 90* 98 101 102  CO2 21* 21* 18* 16* 19*  GLUCOSE 215* 249* 190* 139* 81  BUN 48* 71* 67* 61* 50*  CREATININE 1.51* 1.98* 1.71* 1.52* 1.19  CALCIUM 8.8* 8.7* 8.1* 8.3* 8.3*   GFR Estimated Creatinine  Clearance: 53.1 mL/min (by C-G formula based on SCr of 1.19 mg/dL). Liver Function Tests: Recent Labs  Lab 04/18/23 1759  AST 55*  ALT 48*  ALKPHOS 112  BILITOT 0.6  PROT 7.2  ALBUMIN 3.2*   No results for input(s): "LIPASE", "AMYLASE" in the last 168 hours. No results for input(s): "AMMONIA" in the last 168 hours. Coagulation profile Recent Labs  Lab 04/19/23 0442  INR 1.4*   COVID-19 Labs  No results for input(s): "DDIMER", "FERRITIN", "LDH", "CRP" in the last 72 hours.  Lab Results  Component Value Date   SARSCOV2NAA NEGATIVE 02/14/2023   SARSCOV2NAA NEGATIVE 06/12/2021   SARSCOV2NAA NEGATIVE 12/04/2018    CBC: Recent Labs  Lab 04/15/23 1432 04/18/23 1759 04/19/23 0442  WBC 11.6* 16.5* 15.6*  HGB 12.1* 11.7* 10.3*  HCT 35.1* 34.0* 30.1*  MCV 85.0 83.5 83.1  PLT 233 296 293   Cardiac Enzymes: No results for input(s): "CKTOTAL", "CKMB", "CKMBINDEX", "TROPONINI" in the last 168 hours. BNP (last 3 results) No results for input(s): "PROBNP" in the last 8760 hours. CBG: Recent Labs  Lab 04/19/23 0751 04/19/23 1145 04/19/23 1804 04/19/23 2144  GLUCAP 152* 157* 144* 153*  D-Dimer: No results for input(s): "DDIMER" in the last 72 hours. Hgb A1c: No results for input(s): "HGBA1C" in the last 72 hours. Lipid Profile: No results for input(s): "CHOL", "HDL", "LDLCALC", "TRIG", "CHOLHDL", "LDLDIRECT" in the last 72 hours. Thyroid function studies: Recent Labs    04/19/23 0442  TSH 1.416   Anemia work up: No results for input(s): "VITAMINB12", "FOLATE", "FERRITIN", "TIBC", "IRON", "RETICCTPCT" in the last 72 hours. Sepsis Labs: Recent Labs  Lab 04/15/23 1432 04/18/23 1758 04/18/23 1759 04/19/23 0150 04/19/23 0442  PROCALCITON  --   --   --  2.13  --   WBC 11.6*  --  16.5*  --  15.6*  LATICACIDVEN 0.8 1.1  --   --   --    Microbiology Recent Results (from the past 240 hours)  Blood culture (routine x 2)     Status: None (Preliminary result)    Collection Time: 04/19/23  1:50 AM   Specimen: BLOOD  Result Value Ref Range Status   Specimen Description BLOOD BLOOD LEFT ARM  Final   Special Requests   Final    BOTTLES DRAWN AEROBIC AND ANAEROBIC Blood Culture results may not be optimal due to an inadequate volume of blood received in culture bottles   Culture   Final    NO GROWTH 1 DAY Performed at [facility], [address] [place], [place] [address]    Report Status PENDING  Incomplete  Blood culture (routine x 2)     Status: None (Preliminary result)   Collection Time: 04/19/23  1:56 AM   Specimen: BLOOD  Result Value Ref Range Status   Specimen Description BLOOD BLOOD RIGHT ARM  Final   Special Requests   Final    BOTTLES DRAWN AEROBIC AND ANAEROBIC Blood Culture adequate volume   Culture   Final    NO GROWTH 1 DAY Performed at [facility], [address] [place], [place] [address]    Report Status PENDING  Incomplete  Aerobic/Anaerobic Culture w Gram Stain (surgical/deep wound)     Status: None (Preliminary result)   Collection Time: 04/19/23  7:56 AM   Specimen: Foot  Result Value Ref Range Status   Specimen Description   Final    FOOT Performed at [facility], [address], [place], [place] [address]    Special Requests   Final    NONE Performed at [facility], [address] [place], [place] [address]    Gram Stain   Final    NO WBC SEEN RARE GRAM POSITIVE COCCI Performed at [facility], [address], [place], [place] [address]    Culture PENDING  Incomplete   Report Status PENDING  Incomplete  Surgical PCR screen     Status: None   Collection Time: 04/20/23  1:07 AM   Specimen: Nasal Mucosa; Nasal Swab  Result Value Ref Range Status   MRSA, PCR NEGATIVE NEGATIVE Final   Staphylococcus aureus NEGATIVE NEGATIVE Final    Comment: (NOTE) The Xpert SA Assay (FDA approved for NASAL specimens in patients 71 years of age and older), is one component of  a comprehensive surveillance program. It is not intended to diagnose infection nor to guide or monitor treatment. Performed at [facility], [address] [place], [place] [address]      Medications:    aspirin EC  81 mg Oral Daily   atorvastatin  80 mg Oral QHS   carvedilol  25 mg Oral BID  WC   clopidogrel  75 mg Oral Daily   colchicine  0.6 mg Oral BID   dapagliflozin propanediol  10 mg Oral Daily   enoxaparin (LOVENOX) injection  40 mg Subcutaneous Q24H   folic acid  1 mg Oral Daily   insulin aspart  0-20 Units Subcutaneous TID WC   insulin aspart  0-5 Units Subcutaneous QHS   insulin aspart protamine- aspart  30 Units Subcutaneous BID WC   iron polysaccharides  150 mg Oral Daily   mirtazapine  15 mg Oral QHS   multivitamin with minerals  1 tablet Oral Daily   mupirocin ointment  1 Application Nasal BID   pregabalin  75 mg Oral BID   Ensure Max Protein  11 oz Oral Daily   tamsulosin  0.4 mg Oral QPC supper   thiamine  100 mg Oral Daily   Continuous Infusions:  cefTRIAXone (ROCEPHIN)  IV Stopped (04/19/23 1439)   vancomycin        LOS: 1 day   Marinda Elk  [facility]  04/20/2023, 7:08 AM

## 2023-04-20 NOTE — Progress Notes (Signed)
Pharmacy Antibiotic Note  Manuel [name] is a 77 y.o. male w/ PMH of OA, CAD, HTN, DDD, DM, gout, HLD, OSA, peripheral neuropathy admitted on 04/18/2023 with cellulitis.  Pharmacy has been consulted for vancomycin dosing. Serum creatinine is elevated but improving since admission  Plan:  Scr 1.71>> 1.52 >> 1.19 Random vancomycin level 12/29@0424 = 11 mcg/ml  Will adjust vancomycin dose to 1250 mg IV Q 24 hrs. Goal AUC 400-550. Expected AUC: 461 SCr used: 1.19  Cmin 11.5   Pharmacy will continue to follow and will adjust abx dosing as warranted. F/u renal fxn, cultures, length of tx, etc   Temp (24hrs), Avg:98.8 F (37.1 C), Min:97.9 F (36.6 C), Max:99.7 F (37.6 C)   Recent Labs  Lab 04/15/23 1432 04/18/23 1758 04/18/23 1759 04/19/23 0442 04/19/23 1745 04/20/23 0424  WBC 11.6*  --  16.5* 15.6*  --   --   CREATININE 1.51*  --  1.98* 1.71* 1.52* 1.19  LATICACIDVEN 0.8 1.1  --   --   --   --   VANCORANDOM  --   --   --   --   --  11    Estimated Creatinine Clearance: 53.1 mL/min (by C-G formula based on SCr of 1.19 mg/dL).    No Known Allergies  Antimicrobials this admission: 12/28 cefepime >> x 1 dose 12/28 vancomycin >>  12/28 ceftriaxone >>   Microbiology results: 12/28 BCx: NGTD 12/28 WCx: rare GPC MRSA PCR negative  Thank you for allowing pharmacy to be a part of this patients care.  Bari Mantis PharmD Clinical Pharmacist 04/20/2023

## 2023-04-20 NOTE — Anesthesia Procedure Notes (Signed)
Anesthesia Regional Block: Adductor canal block   Pre-Anesthetic Checklist: , timeout performed,  Correct Patient, Correct Site, Correct Laterality,  Correct Procedure, Correct Position, site marked,  Risks and benefits discussed,  Surgical consent,  Pre-op evaluation,  At surgeon's request and post-op pain management  Laterality: Right  Prep: chloraprep       Needles:  Injection technique: Single-shot  Needle Type: Stimiplex     Needle Length: 9cm  Needle Gauge: 22     Additional Needles:   Procedures:,,,, ultrasound used (permanent image in chart),,    Narrative:  Start time: 04/20/2023 10:09 AM End time: 04/20/2023 10:12 AM Injection made incrementally with aspirations every 5 mL.  Performed by: Personally  Anesthesiologist: Foye Deer, MD  Additional Notes: Patient consented for risk and benefits of nerve block including but not limited to nerve damage, failed block, bleeding and infection.  Patient voiced understanding.  Functioning IV was confirmed and monitors were applied.  Timeout done prior to procedure and prior to any sedation being given to the patient.  Patient confirmed procedure site prior to any sedation given to the patient. Sterile prep,hand hygiene and sterile gloves were used.  Minimal sedation used for procedure.  No paresthesia endorsed by patient during the procedure.  Negative aspiration and negative test dose prior to incremental administration of local anesthetic. The patient tolerated the procedure well with no immediate complications.

## 2023-04-20 NOTE — Anesthesia Preprocedure Evaluation (Addendum)
Anesthesia Evaluation  Patient identified by MRN, date of birth, ID band Patient awake    Reviewed: Allergy & Precautions, H&P , NPO status , Patient's Chart, lab work & pertinent test results  Airway Mallampati: III  TM Distance: >3 FB Neck ROM: full    Dental  (+) Edentulous Upper, Edentulous Lower   Pulmonary sleep apnea , Patient abstained from smoking.   Pulmonary exam normal        Cardiovascular Exercise Tolerance: Poor hypertension, (-) angina + CAD, + Past MI, + Cardiac Stents, + Peripheral Vascular Disease and +CHF   + Peripheral Edema ECHO 11/14/22:  1. Left ventricular ejection fraction, by estimation, is 25 to 30%. The  left ventricle has severely decreased function. The left ventricle  demonstrates regional wall motion abnormalities (see scoring  diagram/findings for description). Left ventricular  diastolic parameters were normal.   2. Right ventricular systolic function is normal. The right ventricular  size is normal.   3. The mitral valve is normal in structure. Mild to moderate mitral valve  regurgitation. No evidence of mitral stenosis.   4. Tricuspid valve regurgitation is mild to moderate.   5. The aortic valve is normal in structure. Aortic valve regurgitation is  not visualized. No aortic stenosis is present.   6. The inferior vena cava is normal in size with greater than 50%  respiratory variability, suggesting right atrial pressure of 3 mmHg.    Bilateral carotid artery stenosis  EKG 7/21: Nonspecific interventricular conduction delay  MPS 10/2018: LVEF= 45%  FINDINGS:  Regional wall motion:  demonstrates  hypokinesis of the inferior wall.  The overall quality of the study is fair.   Artifacts noted: no  Left ventricular cavity: normal.     Neuro/Psych  PSYCHIATRIC DISORDERS (cleared by psychiatry for recent suggestions of suicidal ideation)       Neuromuscular disease (diabetic neuropathy)     GI/Hepatic negative GI ROS,,,(+)     substance abuse  alcohol use and marijuana use  Endo/Other  diabetes, Well Controlled, Type 2, Insulin Dependent    Renal/GU Renal diseaseAKI improved     Musculoskeletal  (+) Arthritis ,    Abdominal Normal abdominal exam  (+)   Peds  Hematology negative hematology ROS (+)   Anesthesia Other Findings Per IM: Sepsis due to cellulitis (HCC) in the setting of an ischemic ulcer of the left lower extremity with 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   Possible acute gout flare of the right knee and ankle Exquisitely tender to palpation when I remove the sheets from the knee he started yelling in pain. Continue colchicine       Past Medical History: No date: Allergies No date: Arthritis No date: Diabetes mellitus without   Past Surgical History: 03/14/2021: CATARACT EXTRACTION W/PHACO; Right     Comment:  Procedure: CATARACT EXTRACTION PHACO AND INTRAOCULAR               LENS PLACEMENT (IOC) RIGHT DIABETIC;  Surgeon:               Lockie Mola, MD;  Location: [facility];              Service: Ophthalmology;  Laterality: Right;                Diabetic 16.78 01:39.9 03/28/2021: CATARACT EXTRACTION W/PHACO; Left     Comment:  Procedure: CATARACT EXTRACTION PHACO AND INTRAOCULAR  LENS PLACEMENT (IOC) LEFT DIABETIC 6.93 01:22.0;                Surgeon: Lockie Mola, MD;  Location: [facility];  Service: Ophthalmology;  Laterality: Left;              Diabetic 05/14/2021: LOWER EXTREMITY ANGIOGRAPHY; Right     Comment:  Procedure: LOWER EXTREMITY ANGIOGRAPHY;  Surgeon: Annice Needy, MD;  Location: [facility];  Service:               Cardiovascular;  Laterality: Right; 06/14/2021: LOWER EXTREMITY ANGIOGRAPHY; Right     Comment:  Procedure: Lower Extremity Angiography;  Surgeon: Annice Needy, MD;  Location: [facility];  Service:               Cardiovascular;  Laterality: Right; 11/11/2022: LOWER EXTREMITY ANGIOGRAPHY; Right     Comment:  Procedure: Lower Extremity Angiography;  Surgeon: Annice Needy, MD;  Location: [facility];  Service:               Cardiovascular;  Laterality: Right; 06/15/2021: LOWER EXTREMITY INTERVENTION; Right     Comment:  Procedure: LOWER EXTREMITY INTERVENTION;  Surgeon: Annice Needy, MD;  Location: [facility];  Service:               Cardiovascular;  Laterality: Right; No date: TONSILLECTOMY     Comment:  had removed as a child  BMI    Body Mass Index: 29.06 kg/m      Reproductive/Obstetrics negative OB ROS                              Anesthesia Physical Anesthesia Plan  ASA: 3  Anesthesia Plan: General   Post-op Pain Management: Ofirmev IV (intra-op)* and Regional block*   Induction: Intravenous  PONV Risk Score and Plan: 2 and Dexamethasone and Ondansetron  Airway Management Planned: Natural Airway  Additional Equipment:   Intra-op Plan:   Post-operative Plan:   Informed Consent: I have reviewed the patients History and Physical, chart, labs and discussed the procedure including the risks, benefits and alternatives for the proposed anesthesia with the patient or authorized representative who has indicated his/her understanding and acceptance.   Patient has DNR.  Suspend DNR and Discussed DNR with patient.   Dental Advisory Given  Plan Discussed with: CRNA and Surgeon  Anesthesia Plan Comments:          Anesthesia Quick Evaluation

## 2023-04-20 NOTE — Consult Note (Signed)
[facility] Vascular Consult Note  MRN : [id number]  Manuel [name] is a 77 y.o. (February 24, 1946) male who presents with chief complaint of  Chief Complaint  Patient presents with   Altered Mental Status   Weakness   Fall   Leg Swelling  .  History of Present Illness: 63 yom PMH- PAD, CAD, DM2, Cardiomyopathy- well known to service. S/P extensive open and endovascular interventions for atherosclerotic occlusive disease of bilateral lower extremities with gangrene of the right foot; subsequent transmetarsal amputation. Progressive right foot pain and ischemia. Now with worsening right foot infection, gas gangrene, osteomyelitis and sepsis.  Current Facility-Administered Medications  Medication Dose Route Frequency Provider Last Rate Last Admin   acetaminophen (TYLENOL) tablet 650 mg  650 mg Oral Q6H PRN Mansy, Jan A, MD       Or   acetaminophen (TYLENOL) suppository 650 mg  650 mg Rectal Q6H PRN Mansy, Vernetta Honey, MD       aspirin EC tablet 81 mg  81 mg Oral Daily Mansy, Jan A, MD   81 mg at 04/19/23 0903   atorvastatin (LIPITOR) tablet 80 mg  80 mg Oral QHS Mansy, Jan A, MD   80 mg at 04/19/23 2207   carvedilol (COREG) tablet 25 mg  25 mg Oral BID WC Mansy, Jan A, MD   25 mg at 04/20/23 1761   cefTRIAXone (ROCEPHIN) 2 g in sodium chloride 0.9 % 100 mL IVPB  2 g Intravenous Q24H Mansy, Jan A, MD   Stopped at 04/19/23 1439   clopidogrel (PLAVIX) tablet 75 mg  75 mg Oral Daily Mansy, Jan A, MD   75 mg at 04/19/23 6073   colchicine tablet 0.6 mg  0.6 mg Oral BID Marinda Elk, MD   0.6 mg at 04/19/23 2208   dapagliflozin propanediol (FARXIGA) tablet 10 mg  10 mg Oral Daily Mansy, Jan A, MD   10 mg at 04/19/23 1041   enoxaparin (LOVENOX) injection 40 mg  40 mg Subcutaneous Q24H Mansy, Jan A, MD   40 mg at 04/19/23 0912   folic acid (FOLVITE) tablet 1 mg  1 mg Oral Daily Mansy, Jan A, MD   1 mg at 04/19/23 7106   hydrALAZINE (APRESOLINE) tablet 25 mg  25 mg Oral Q6H  PRN Marinda Elk, MD       insulin aspart (novoLOG) injection 0-20 Units  0-20 Units Subcutaneous TID WC Mansy, Jan A, MD   3 Units at 04/19/23 1811   insulin aspart (novoLOG) injection 0-5 Units  0-5 Units Subcutaneous QHS Mansy, Jan A, MD       insulin aspart protamine- aspart (NOVOLOG MIX 70/30) injection 30 Units  30 Units Subcutaneous BID WC Mansy, Jan A, MD   30 Units at 04/19/23 1848   iron polysaccharides (NIFEREX) capsule 150 mg  150 mg Oral Daily Mansy, Jan A, MD   150 mg at 04/19/23 1041   mirtazapine (REMERON) tablet 15 mg  15 mg Oral QHS Mansy, Jan A, MD   15 mg at 04/19/23 2207   morphine (PF) 4 MG/ML injection 4 mg  4 mg Intravenous Q4H PRN Marinda Elk, MD   4 mg at 04/20/23 0134   multivitamin with minerals tablet 1 tablet  1 tablet Oral Daily Mansy, Jan A, MD   1 tablet at 04/19/23 0902   mupirocin ointment (BACTROBAN) 2 % 1 Application  1 Application Nasal BID Marinda Elk, MD   1 Application at 04/20/23  0112   ondansetron (ZOFRAN) tablet 4 mg  4 mg Oral Q6H PRN Mansy, Jan A, MD       Or   ondansetron [facility]) injection 4 mg  4 mg Intravenous Q6H PRN Mansy, Vernetta Honey, MD       oxyCODONE-acetaminophen (PERCOCET/ROXICET) 5-325 MG per tablet 1 tablet  1 tablet Oral Q6H PRN Mansy, Jan A, MD   1 tablet at 04/20/23 0503   pregabalin (LYRICA) capsule 75 mg  75 mg Oral BID Mansy, Jan A, MD   75 mg at 04/19/23 2208   protein supplement (ENSURE MAX) liquid  11 oz Oral Daily Mansy, Jan A, MD       senna-docusate (Senokot-S) tablet 1 tablet  1 tablet Oral QHS PRN Mansy, Jan A, MD       tamsulosin [facility]) capsule 0.4 mg  0.4 mg Oral QPC supper Mansy, Jan A, MD   0.4 mg at 04/19/23 1810   thiamine (VITAMIN B1) tablet 100 mg  100 mg Oral Daily Mansy, Jan A, MD   100 mg at 04/19/23 0902   traZODone (DESYREL) tablet 25 mg  25 mg Oral QHS PRN Mansy, Jan A, MD       vancomycin (VANCOREADY) IVPB 1250 mg/250 mL  1,250 mg Intravenous Q24H Angelique Blonder, RPH 166.7 mL/hr at  04/20/23 0816 1,250 mg at 04/20/23 1610    Past Medical History:  Diagnosis Date   Acute ST elevation myocardial infarction (STEMI) of inferior wall (HCC) 04/19/2010   a.) transfered from [facility] to [facility] --> LHC/PCI (very difficult procedure) --> 3.0 x 23 mm and 3.0 x 12 mm Xience stents to RCA   Allergies    Arthritis    Benign essential hypertension    Bilateral carotid artery disease (HCC) 05/08/2021   a.) carotid doppler 05/08/2021: 1-39% BICA   CAD (coronary artery disease) 04/19/2010   a.) inferior STEMI 04/19/2010 --> LHC/PCI: 50-70% pD1, 80% pRI, 90/90/90% RCA (overlapping 3.0 x 23 and 3.0 x 12 mm Xience DES); b.) MV 11/10/2018: fixed minimally reversible inferior perfusion defect   Cellulitis of foot    DDD (degenerative disc disease), cervical    Diabetes mellitus type 2, insulin dependent (HCC)    Diverticulosis    Full dentures    Gout    Hard of hearing    History of bilateral cataract extraction 2022   History of ETOH abuse    Hyperlipidemia    Ischemic cardiomyopathy 04/19/2010   a.) TTE 04/19/2010: 40%; b.) TTE 04/20/2014: EF >55%, mild RVE, triv PR, mild MR/TR, G1DD; c.) TTE 11/10/2018: EF 45%, inf HK, mild RVE, triv TR/PR, mild MR, G1DD; d.) TTE 11/14/2022: EF 25-30%, basal anteroseptal, apical lateral, apical septal, and apex AK, mid anterolateral HK, mild-mod MR/TR   Long term current use of aspirin    Long term current use of clopidogrel    Lumbar degenerative disc disease    Lumbar radiculopathy    Lumbar vertebral fracture (chronic superior endplate of L1)    OSA (obstructive sleep apnea)    a.) unable to tolerate nocturnal PAP therapy   Peripheral artery disease (HCC)    a.) stenting 05/14/21: 12 mm x 12 cm LifeStent RIGHT dis SFA/prox pop; b.) s/p cath directed thrombolysis RIGHT SFA/pop 06/14/21; c.) s/p mech thrombectomy + stenting 06/15/21: 8 mm x 25cm & 8 mm x 7.5cm Viabahn; d.) s/p BILAT CFA, profunda femoris, SFA endarterectomies + fogarty embolectomy +  stenting 11/15/22: 12mm x 58mm Lifestream BILAT CIAs, 14 mm x  6 cm Lifestream & 13 mm x 5 cm Viabahn LEFT EIA   Peripheral neuropathy    Umbilical hernia     Past Surgical History:  Procedure Laterality Date   AMPUTATION Right 11/18/2022   Procedure: AMPUTATION 4TH AND 5TH RAY;  Surgeon: Linus Galas, [name];  Location: [facility];  Service: Orthopedics/Podiatry;  Laterality: Right;  4th and 5th toe   APPLICATION OF WOUND VAC Right 01/09/2023   Procedure: APPLICATION OF WOUND VAC;  Surgeon: Annice Needy, MD;  Location: [facility];  Service: Vascular;  Laterality: Right;   CATARACT EXTRACTION W/PHACO Right 03/14/2021   Procedure: CATARACT EXTRACTION PHACO AND INTRAOCULAR LENS PLACEMENT (IOC) RIGHT DIABETIC;  Surgeon: Lockie Mola, MD;  Location: [facility];  Service: Ophthalmology;  Laterality: Right;  Diabetic 16.78 01:39.9   CATARACT EXTRACTION W/PHACO Left 03/28/2021   Procedure: CATARACT EXTRACTION PHACO AND INTRAOCULAR LENS PLACEMENT (IOC) LEFT DIABETIC 6.93 01:22.0;  Surgeon: Lockie Mola, MD;  Location: [facility];  Service: Ophthalmology;  Laterality: Left;  Diabetic   COLONOSCOPY     CORONARY ANGIOPLASTY WITH STENT PLACEMENT  03/2010   Procedure: CORONARY ANGIOPLASTY WITH STENT PLACEMENT; Location: [name]   ENDARTERECTOMY FEMORAL Bilateral 11/15/2022   Procedure: BILATERAL COMMON FEMORAL PROFUNDA FEMORIS AND SUPERFICIAL FEMORAL ARTERY ENDARTECTOMIES, RIGHT FOGARTY EMBOLECTOMY OF THE RIGHT SFA  AND  POPLITEAL ARTERIES. AORTAGRAM AND RIGHT LOWER EXTREMITY ANGIOGRAM.;  Surgeon: Annice Needy, MD;  Location: [facility];  Service: Vascular;  Laterality: Bilateral;   INSERTION OF ILIAC STENT Bilateral 11/15/2022   Procedure: BILATERAL STENT INSERTION IN BILATERAL  COMMON ILIAC ARTERY, STENT INSERTION OF LEFT EXTERNAL ILIAC ARTERY. ANGIOPLASTY RIGHT TIBIAL  AND POPLITEAL ARTERY.;  Surgeon: Annice Needy, MD;  Location: [facility];  Service: Vascular;  Laterality: Bilateral;    LOWER EXTREMITY ANGIOGRAPHY Right 05/14/2021   Procedure: LOWER EXTREMITY ANGIOGRAPHY;  Surgeon: Annice Needy, MD;  Location: [facility];  Service: Cardiovascular;  Laterality: Right;   LOWER EXTREMITY ANGIOGRAPHY Right 06/14/2021   Procedure: Lower Extremity Angiography;  Surgeon: Annice Needy, MD;  Location: [facility];  Service: Cardiovascular;  Laterality: Right;   LOWER EXTREMITY ANGIOGRAPHY Right 11/11/2022   Procedure: Lower Extremity Angiography;  Surgeon: Annice Needy, MD;  Location: [facility];  Service: Cardiovascular;  Laterality: Right;   LOWER EXTREMITY INTERVENTION Right 06/15/2021   Procedure: LOWER EXTREMITY INTERVENTION;  Surgeon: Annice Needy, MD;  Location: [facility];  Service: Cardiovascular;  Laterality: Right;   TONSILLECTOMY     TRANSMETATARSAL AMPUTATION Right 02/17/2023   Procedure: TRANSMETATARSAL AMPUTATION;  Surgeon: Rosetta Posner, [name];  Location: [facility];  Service: Orthopedics/Podiatry;  Laterality: Right;   WOUND DEBRIDEMENT Right 01/09/2023   Procedure: DEBRIDEMENT WOUND;  Surgeon: Annice Needy, MD;  Location: [facility];  Service: Vascular;  Laterality: Right;    Social History Social History   Tobacco Use   Smoking status: Never   Smokeless tobacco: Never  Vaping Use   Vaping status: Never Used  Substance Use Topics   Alcohol use: Yes    Alcohol/week: 21.0 standard drinks of alcohol    Types: 7 Glasses of wine, 14 Cans of beer per week    Comment: occassional   Drug use: No    Family History Family History  Problem Relation Age of Onset   Scoliosis Mother    Heart disease Father     No Known Allergies   REVIEW OF SYSTEMS (Negative unless checked)  Constitutional: [] Weight loss  []   Fever  [] Chills Cardiac: [] Chest pain   [] Chest pressure   [] Palpitations   [] Shortness of breath when laying flat   [] Shortness of breath at rest   [] Shortness of breath with exertion. Vascular:  [x] Pain in legs with  walking   [x] Pain in legs at rest   [] Pain in legs when laying flat   [] Claudication   [] Pain in feet when walking  [] Pain in feet at rest  [] Pain in feet when laying flat   [] History of DVT   [] Phlebitis   [] Swelling in legs   [] Varicose veins   [] Non-healing ulcers Pulmonary:   [] Uses home oxygen   [] Productive cough   [] Hemoptysis   [] Wheeze  [] COPD   [] Asthma Neurologic:  [] Dizziness  [] Blackouts   [] Seizures   [] History of stroke   [] History of TIA  [] Aphasia   [] Temporary blindness   [] Dysphagia   [] Weakness or numbness in arms   [] Weakness or numbness in legs Musculoskeletal:  [] Arthritis   [] Joint swelling   [] Joint pain   [] Low back pain Hematologic:  [] Easy bruising  [] Easy bleeding   [] Hypercoagulable state   [] Anemic  [] Hepatitis Gastrointestinal:  [] Blood in stool   [] Vomiting blood  [] Gastroesophageal reflux/heartburn   [] Difficulty swallowing. Genitourinary:  [] Chronic kidney disease   [] Difficult urination  [] Frequent urination  [] Burning with urination   [] Blood in urine Skin:  [] Rashes   [] Ulcers   [x] Wounds Psychological:  [] History of anxiety   [x]  History of major depression.  Physical Examination  Vitals:   04/19/23 1813 04/19/23 2000 04/19/23 2149 04/20/23 0747  BP:  118/67 138/76 128/61  Pulse:  75 74 72  Resp:  (!) 22 18 18   Temp: 98.5 F (36.9 C)  97.9 F (36.6 C) 99.5 F (37.5 C)  TempSrc: Axillary  Oral Oral  SpO2:  90% 97% 93%  Weight:      Height:       Body mass index is 28.12 kg/m. Gen:  WD/WN, NAD Head: /AT, No temporalis wasting. Prominent temp pulse not noted. Ear/Nose/Throat: Hearing grossly intact, nares w/o erythema or drainage, oropharynx w/o Erythema/Exudate Eyes: Sclera non-icteric, conjunctiva clear Neck: Trachea midline.  No JVD.  Pulmonary:  Good air movement, respirations not labored, equal bilaterally.  Cardiac: RRR, normal S1, S2. Vascular:  Vessel Right Left  Radial Palpable Palpable  Ulnar    Brachial    Carotid    Aorta     Femoral    Popliteal    PT Non Palpable   DP Non Palpable Palpable   Gastrointestinal: soft, non-tender/non-distended. No guarding/reflex.  Musculoskeletal: right foot TMA- erythema, edema, open ulceration with purulent drainage, tender Neurologic: Sensation grossly intact in extremities.  Symmetrical.  Speech is fluent. Motor exam as listed above. Psychiatric: Judgment intact, Mood & affect appropriate for pt's clinical situation. Dermatologic: No rashes or ulcers noted.  No cellulitis or open wounds. Lymph : No Cervical, Axillary, or Inguinal lymphadenopathy.     CBC Lab Results  Component Value Date   WBC 15.6 (H) 04/19/2023   HGB 10.3 (L) 04/19/2023   HCT 30.1 (L) 04/19/2023   MCV 83.1 04/19/2023   PLT 293 04/19/2023    BMET    Component Value Date/Time   NA 130 (L) 04/20/2023 0424   NA 137 02/16/2012 2013   K 4.6 04/20/2023 0424   K 3.8 02/16/2012 2013   CL 102 04/20/2023 0424   CL 102 02/16/2012 2013   CO2 19 (L) 04/20/2023 0424   CO2 25 02/16/2012 2013  GLUCOSE 81 04/20/2023 0424   GLUCOSE 154 (H) 02/16/2012 2013   BUN 50 (H) 04/20/2023 0424   BUN 10 02/16/2012 2013   CREATININE 1.19 04/20/2023 0424   CREATININE 0.72 02/16/2012 2013   CALCIUM 8.3 (L) 04/20/2023 0424   CALCIUM 9.6 02/16/2012 2013   GFRNONAA >60 04/20/2023 0424   GFRNONAA >60 02/16/2012 2013   GFRAA >60 12/09/2018 0929   GFRAA >60 02/16/2012 2013   Estimated Creatinine Clearance: 53.1 mL/min (by C-G formula based on SCr of 1.19 mg/dL).  COAG Lab Results  Component Value Date   INR 1.4 (H) 04/19/2023   INR 1.3 (H) 01/15/2023   INR 1.1 11/10/2022    Radiology MR FOOT RIGHT WO CONTRAST Result Date: 04/19/2023 CLINICAL DATA:  Osteomyelitis, foot EXAM: MRI OF THE RIGHT FOREFOOT WITHOUT CONTRAST TECHNIQUE: Multiplanar, multisequence MR imaging of the right foot was performed. No intravenous contrast was administered. COMPARISON:  X-ray 04/19/2023, 02/17/2023 FINDINGS:  Bones/Joint/Cartilage Postsurgical changes following transmetatarsal amputation of the first through fifth rays. Bone marrow edema is present throughout the residual first through fourth metatarsals with intermediate to low T1 marrow signal. No signal abnormality within the residual fifth metatarsal. No tarsometatarsal joint effusion. Preserved marrow signal within the hindfoot and midfoot. No fracture or dislocation. Ligaments Intact Lisfranc ligament. Muscles and Tendons Amputation changes of the musculotendinous structures of the forefoot. Diffuse edema-like signal of the foot musculature which may represent changes related to denervation and/or myositis. No tenosynovitis. Soft tissues Shallow soft tissue wound or ulceration at the medial aspect of the distal stump with sinus tracks extending to the resection margins of the first and second metatarsals. Small amount of fluid surrounds both the first and second metatarsal distally. Susceptibility artifact within the tissues adjacent to the first metatarsal suggest soft tissue gas. Generalized soft tissue swelling with subcutaneous edema. IMPRESSION: 1. Prior transmetatarsal amputation of the right foot with findings of acute osteomyelitis involving the first through fourth metatarsals. 2. Shallow soft tissue wound or ulceration at the medial aspect of the distal stump with sinus tracks extending to the resection margins of the first and second metatarsals. Small amount of fluid surrounds both the first and second metatarsal distally. Susceptibility artifact within the tissues adjacent to the first metatarsal suggest soft tissue gas. Electronically Signed   By: Duanne Guess D.O.   On: 04/19/2023 14:13   DG Foot Complete Right Result Date: 04/19/2023 CLINICAL DATA:  Severe right foot cellulitis.  Previous amputation. EXAM: RIGHT FOOT COMPLETE - 3+ VIEW COMPARISON:  None Available. FINDINGS: Previous amputation of all digits is seen at the level of the mid  metatarsals. Prominent distal soft tissue swelling is seen. Osteolysis and periostitis is seen involving the 1st and 4th metatarsals, highly suspicious for osteomyelitis. IMPRESSION: Findings highly suspicious for osteomyelitis involving the 1st and 4th metatarsals. Electronically Signed   By: Danae Orleans M.D.   On: 04/19/2023 11:21   US Venous Img Lower Right (DVT Study) Result Date: 04/18/2023 CLINICAL DATA:  Right lower extremity swelling. EXAM: Right LOWER EXTREMITY VENOUS DOPPLER ULTRASOUND TECHNIQUE: Gray-scale sonography with compression, as well as color and duplex ultrasound, were performed to evaluate the deep venous system(s) from the level of the common femoral vein through the popliteal and proximal calf veins. COMPARISON:  None Available. FINDINGS: VENOUS Normal compressibility of the common femoral, superficial femoral, and popliteal veins, as well as the visualized calf veins. Visualized portions of profunda femoral vein and great saphenous vein unremarkable. No filling defects to suggest DVT on  grayscale or color Doppler imaging. Doppler waveforms show normal direction of venous flow, normal respiratory plasticity and response to augmentation. Limited views of the contralateral common femoral vein are unremarkable. OTHER There is subcutaneous edema. Limitations: none IMPRESSION: No DVT identified. Electronically Signed   By: Elgie Collard M.D.   On: 04/18/2023 19:13   DG Knee Complete 4 Views Right Result Date: 04/15/2023 CLINICAL DATA:  Fall, right knee pain EXAM: RIGHT KNEE - COMPLETE 4+ VIEW COMPARISON:  None Available. FINDINGS: No acute bony abnormality. Specifically, no fracture, subluxation, or dislocation. Joint space narrowing in the lateral compartment. No joint effusion. IMPRESSION: No acute bony abnormality. Electronically Signed   By: Charlett Nose M.D.   On: 04/15/2023 19:06      Assessment/Plan 1. 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 At this juncture there are no further feasible limb salvage options. Extensive discussions with patient regarding current status and recommendation for RIGHT below knee amputation. Patient expressed understanding and wishes to proceed with RIGHT below knee amputation. All questions answered. Consent obtained.   Bertram Denver, MD  04/20/2023 9:16 AM    This note was created with Dragon medical transcription system.  Any error is purely unintentional

## 2023-04-20 NOTE — Anesthesia Postprocedure Evaluation (Signed)
Anesthesia Post Note  Patient: Manuel [name]  Procedure(s) Performed: AMPUTATION BELOW KNEE (Right: Knee)  Patient location during evaluation: PACU Anesthesia Type: General Level of consciousness: awake and alert Pain management: pain level controlled Vital Signs Assessment: post-procedure vital signs reviewed and stable Respiratory status: spontaneous breathing, nonlabored ventilation and respiratory function stable Cardiovascular status: blood pressure returned to baseline and stable Postop Assessment: no apparent nausea or vomiting Anesthetic complications: no   No notable events documented.   Last Vitals:  Vitals:   04/20/23 1305 04/20/23 1536  BP: (!) 135/57 (!) 129/56  Pulse: 65 69  Resp: 16 16  Temp: 36.5 C 37.3 C  SpO2: 100% 98%    Last Pain:  Vitals:   04/20/23 2018  TempSrc:   PainSc: 10-Worst pain ever                 Foye Deer

## 2023-04-20 NOTE — Transfer of Care (Signed)
Immediate Anesthesia Transfer of Care Note  Patient: Manuel [name]  Procedure(s) Performed: AMPUTATION BELOW KNEE (Right: Knee)  Patient Location: PACU  Anesthesia Type:General  Level of Consciousness: awake, alert , and oriented  Airway & Oxygen Therapy: Patient connected to nasal cannula oxygen  Post-op Assessment: Report given to RN and Post -op Vital signs reviewed and stable  Post vital signs: Reviewed and stable  Last Vitals:  Vitals Value Taken Time  BP 99/49 04/20/23 1203  Temp    Pulse 65 04/20/23 1203  Resp 11 04/20/23 1203  SpO2 100 % 04/20/23 1203  Vitals shown include unfiled device data.  Last Pain:  Vitals:   04/20/23 0747  TempSrc: Oral  PainSc: 0-No pain      Patients Stated Pain Goal: 3 (04/20/23 0502)  Complications: No notable events documented.

## 2023-04-20 NOTE — Anesthesia Procedure Notes (Signed)
Procedure Name: MAC Date/Time: 04/20/2023 10:35 AM  Performed by: Karoline Caldwell, CRNAPre-anesthesia Checklist: Patient identified, Patient being monitored, Timeout performed, Emergency Drugs available and Suction available Patient Re-evaluated:Patient Re-evaluated prior to induction Oxygen Delivery Method: Simple face mask Preoxygenation: Pre-oxygenation with 100% oxygen

## 2023-04-20 NOTE — Anesthesia Procedure Notes (Signed)
Anesthesia Regional Block: Popliteal block   Pre-Anesthetic Checklist: , timeout performed,  Correct Patient, Correct Site, Correct Laterality,  Correct Procedure, Correct Position, site marked,  Risks and benefits discussed,  Surgical consent,  Pre-op evaluation,  At surgeon's request and post-op pain management  Laterality: Right  Prep: chloraprep       Needles:  Injection technique: Single-shot  Needle Type: Stimiplex     Needle Length: 9cm  Needle Gauge: 22     Additional Needles:   Procedures:,,,, ultrasound used (permanent image in chart),,    Narrative:  Start time: 04/20/2023 10:11 AM End time: 04/20/2023 10:15 AM Injection made incrementally with aspirations every 5 mL.  Performed by: Personally  Anesthesiologist: Foye Deer, MD  Additional Notes: Patient consented for risk and benefits of nerve block including but not limited to nerve damage, failed block, bleeding and infection.  Patient voiced understanding.  Functioning IV was confirmed and monitors were applied.  Timeout done prior to procedure and prior to any sedation being given to the patient.  Patient confirmed procedure site prior to any sedation given to the patient. Sterile prep,hand hygiene and sterile gloves were used.  Minimal sedation used for procedure.  No paresthesia endorsed by patient during the procedure.  Negative aspiration and negative test dose prior to incremental administration of local anesthetic. The patient tolerated the procedure well with no immediate complications.

## 2023-04-20 NOTE — Consult Note (Signed)
Orthopedic Surgery Inpatient/ED Consultation Note  Reason for consult: Right knee pain  Date of consult: 04/20/23 Referring provider: Marinda Elk, MD  Chief Complaint: Chief Complaint  Patient presents with   Altered Mental Status   Weakness   Fall   Leg Swelling    HPI: Manuel [name] who is seen today regarding a complaint of severe right knee pain.  He had a injury a few days ago was in the ER on December 24 x-ray showed no acute findings.  His x-rays show degenerative changes and significant vascular disease.  He is known to the vascular service and the podiatry service and had prior foot amputation.  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  He had complained of knee pain and had exquisite tenderness on the exam.  There was concern from the hospitalist team for gout versus infection.  I was consulted for evaluation.  I evaluated the patient bedside in room 139.  His wife was present Ashby Dawes: Progressively worsening Location: Right knee Intensity: Sharp Character: Intermittent Modifying Factors: Pressing on the knee hurts Prior surgery: No surgery to the knee Was there trauma: Trauma on December 24  Past Medical History: Reviewed. Past Medical History:  Diagnosis Date   Acute ST elevation myocardial infarction (STEMI) of inferior wall (HCC) 04/19/2010   a.) transfered from [facility] to [facility] --> LHC/PCI (very difficult procedure) --> 3.0 x 23 mm and 3.0 x 12 mm Xience stents to RCA   Allergies    Arthritis    Benign essential hypertension    Bilateral carotid artery disease (HCC) 05/08/2021   a.) carotid doppler 05/08/2021: 1-39% BICA   CAD (coronary artery disease) 04/19/2010   a.) inferior STEMI 04/19/2010 --> LHC/PCI: 50-70% pD1, 80% pRI, 90/90/90% RCA (overlapping 3.0 x 23 and 3.0 x 12 mm Xience DES); b.) MV 11/10/2018: fixed minimally reversible inferior perfusion defect   Cellulitis of foot    DDD (degenerative  disc disease), cervical    Diabetes mellitus type 2, insulin dependent (HCC)    Diverticulosis    Full dentures    Gout    Hard of hearing    History of bilateral cataract extraction 2022   History of ETOH abuse    Hyperlipidemia    Ischemic cardiomyopathy 04/19/2010   a.) TTE 04/19/2010: 40%; b.) TTE 04/20/2014: EF >55%, mild RVE, triv PR, mild MR/TR, G1DD; c.) TTE 11/10/2018: EF 45%, inf HK, mild RVE, triv TR/PR, mild MR, G1DD; d.) TTE 11/14/2022: EF 25-30%, basal anteroseptal, apical lateral, apical septal, and apex AK, mid anterolateral HK, mild-mod MR/TR   Long term current use of aspirin    Long term current use of clopidogrel    Lumbar degenerative disc disease    Lumbar radiculopathy    Lumbar vertebral fracture (chronic superior endplate of L1)    OSA (obstructive sleep apnea)    a.) unable to tolerate nocturnal PAP therapy   Peripheral artery disease (HCC)    a.) stenting 05/14/21: 12 mm x 12 cm LifeStent RIGHT dis SFA/prox pop; b.) s/p cath directed thrombolysis RIGHT SFA/pop 06/14/21; c.) s/p mech thrombectomy + stenting 06/15/21: 8 mm x 25cm & 8 mm x 7.5cm Viabahn; d.) s/p BILAT CFA, profunda femoris, SFA endarterectomies + fogarty embolectomy + stenting 11/15/22: 12mm x 58mm Lifestream BILAT CIAs, 14 mm x 6 cm Lifestream & 13 mm x 5 cm Viabahn LEFT EIA   Peripheral neuropathy    Umbilical hernia  Past Surgical History: Reviewed. Past Surgical History:  Procedure Laterality Date   AMPUTATION Right 11/18/2022   Procedure: AMPUTATION 4TH AND 5TH RAY;  Surgeon: Linus Galas, [name];  Location: [facility];  Service: Orthopedics/Podiatry;  Laterality: Right;  4th and 5th toe   APPLICATION OF WOUND VAC Right 01/09/2023   Procedure: APPLICATION OF WOUND VAC;  Surgeon: Annice Needy, MD;  Location: [facility];  Service: Vascular;  Laterality: Right;   CATARACT EXTRACTION W/PHACO Right 03/14/2021   Procedure: CATARACT EXTRACTION PHACO AND INTRAOCULAR LENS PLACEMENT (IOC) RIGHT DIABETIC;   Surgeon: Lockie Mola, MD;  Location: [facility];  Service: Ophthalmology;  Laterality: Right;  Diabetic 16.78 01:39.9   CATARACT EXTRACTION W/PHACO Left 03/28/2021   Procedure: CATARACT EXTRACTION PHACO AND INTRAOCULAR LENS PLACEMENT (IOC) LEFT DIABETIC 6.93 01:22.0;  Surgeon: Lockie Mola, MD;  Location: [facility];  Service: Ophthalmology;  Laterality: Left;  Diabetic   COLONOSCOPY     CORONARY ANGIOPLASTY WITH STENT PLACEMENT  03/2010   Procedure: CORONARY ANGIOPLASTY WITH STENT PLACEMENT; Location: [name]   ENDARTERECTOMY FEMORAL Bilateral 11/15/2022   Procedure: BILATERAL COMMON FEMORAL PROFUNDA FEMORIS AND SUPERFICIAL FEMORAL ARTERY ENDARTECTOMIES, RIGHT FOGARTY EMBOLECTOMY OF THE RIGHT SFA  AND  POPLITEAL ARTERIES. AORTAGRAM AND RIGHT LOWER EXTREMITY ANGIOGRAM.;  Surgeon: Annice Needy, MD;  Location: [facility];  Service: Vascular;  Laterality: Bilateral;   INSERTION OF ILIAC STENT Bilateral 11/15/2022   Procedure: BILATERAL STENT INSERTION IN BILATERAL  COMMON ILIAC ARTERY, STENT INSERTION OF LEFT EXTERNAL ILIAC ARTERY. ANGIOPLASTY RIGHT TIBIAL  AND POPLITEAL ARTERY.;  Surgeon: Annice Needy, MD;  Location: [facility];  Service: Vascular;  Laterality: Bilateral;   LOWER EXTREMITY ANGIOGRAPHY Right 05/14/2021   Procedure: LOWER EXTREMITY ANGIOGRAPHY;  Surgeon: Annice Needy, MD;  Location: [facility];  Service: Cardiovascular;  Laterality: Right;   LOWER EXTREMITY ANGIOGRAPHY Right 06/14/2021   Procedure: Lower Extremity Angiography;  Surgeon: Annice Needy, MD;  Location: [facility];  Service: Cardiovascular;  Laterality: Right;   LOWER EXTREMITY ANGIOGRAPHY Right 11/11/2022   Procedure: Lower Extremity Angiography;  Surgeon: Annice Needy, MD;  Location: [facility];  Service: Cardiovascular;  Laterality: Right;   LOWER EXTREMITY INTERVENTION Right 06/15/2021   Procedure: LOWER EXTREMITY INTERVENTION;  Surgeon: Annice Needy, MD;   Location: [facility];  Service: Cardiovascular;  Laterality: Right;   TONSILLECTOMY     TRANSMETATARSAL AMPUTATION Right 02/17/2023   Procedure: TRANSMETATARSAL AMPUTATION;  Surgeon: Rosetta Posner, [name];  Location: [facility];  Service: Orthopedics/Podiatry;  Laterality: Right;   WOUND DEBRIDEMENT Right 01/09/2023   Procedure: DEBRIDEMENT WOUND;  Surgeon: Annice Needy, MD;  Location: [facility];  Service: Vascular;  Laterality: Right;    Family History: Reviewed. The family history includes Heart disease in his father; Scoliosis in his mother.  Social History: Reviewed. Social History   Socioeconomic History   Marital status: Married    Spouse name: [name]   Number of children: 3   Years of education: Not on file   Highest education level: Not on file  Occupational History   Not on file  Tobacco Use   Smoking status: Never   Smokeless tobacco: Never  Vaping Use   Vaping status: Never Used  Substance and Sexual Activity   Alcohol use: Yes    Alcohol/week: 21.0 standard drinks of alcohol    Types: 7 Glasses of wine, 14 Cans of beer per week    Comment: occassional   Drug use:  No   Sexual activity: Not on file  Other Topics Concern   Not on file  Social History Narrative   Not on file   Social Drivers of Health   Financial Resource Strain: Patient Unable To Answer (02/20/2023)   Received from [facility]   Overall Financial Resource Strain (CARDIA)    Difficulty of Paying Living Expenses: Patient unable to answer  Food Insecurity: No Food Insecurity (04/19/2023)   Hunger Vital Sign    Worried About Running Out of Food in the Last Year: Never true    Ran Out of Food in the Last Year: Never true  Transportation Needs: No Transportation Needs (04/19/2023)   PRAPARE - Administrator, Civil Service (Medical): No    Lack of Transportation (Non-Medical): No  Physical Activity: Not on file  Stress: Not on file  Social Connections: Not on  file    Home Meds: Reviewed. Prior to Admission medications   Medication Sig Start Date End Date Taking? Authorizing Provider  amitriptyline (ELAVIL) 25 MG tablet Take 25 mg by mouth at bedtime. 04/07/23  Yes [provider]  aspirin EC 81 MG tablet Take 1 tablet (81 mg total) by mouth daily. 11/26/22  Yes Delfino Lovett, MD  atorvastatin (LIPITOR) 80 MG tablet Take 1 tablet (80 mg total) by mouth daily. Patient taking differently: Take 80 mg by mouth at bedtime. 11/26/22  Yes Delfino Lovett, MD  carvedilol (COREG) 25 MG tablet Take 25 mg by mouth 2 (two) times daily with a meal.   Yes [provider]  cephALEXin (KEFLEX) 500 MG capsule Take 1 capsule (500 mg total) by mouth 3 (three) times daily for 10 days. 04/15/23 04/25/23 Yes Sharman Cheek, MD  clopidogrel (PLAVIX) 75 MG tablet Take 1 tablet (75 mg total) by mouth daily. 09/18/22  Yes Georgiana Spinner, NP  dapagliflozin propanediol (FARXIGA) 10 MG TABS tablet Take 1 tablet (10 mg total) by mouth daily. 01/20/23 04/20/23 Yes Gillis Santa, MD  fluticasone (FLONASE) 50 MCG/ACT nasal spray Place 1 spray into both nostrils daily as needed for allergies or rhinitis.   Yes [provider]  folic acid (FOLVITE) 1 MG tablet Take 1 tablet (1 mg total) by mouth daily. 02/20/23  Yes Arnetha Courser, MD  insulin aspart protamine- aspart (NOVOLOG MIX 70/30) (70-30) 100 UNIT/ML injection Inject 30 Units into the skin 2 (two) times daily with a meal.   Yes [provider]  iron polysaccharides (NIFEREX) 150 MG capsule Take 1 capsule (150 mg total) by mouth daily. 01/20/23 04/20/23 Yes Gillis Santa, MD  mirtazapine (REMERON) 15 MG tablet Take 1 tablet (15 mg total) by mouth at bedtime. 02/19/23  Yes Arnetha Courser, MD  Multiple Vitamin (MULTIVITAMIN WITH MINERALS) TABS tablet Take 1 tablet by mouth daily. 02/20/23  Yes Arnetha Courser, MD  oxyCODONE-acetaminophen (PERCOCET/ROXICET) 5-325 MG tablet Take 1 tablet by mouth every 6 (six)  hours as needed. 04/17/23  Yes [provider]  polyethylene glycol (MIRALAX / GLYCOLAX) 17 g packet Take 17 g by mouth daily as needed for mild constipation. 02/19/23  Yes Arnetha Courser, MD  pregabalin (LYRICA) 75 MG capsule Take 1 capsule (75 mg total) by mouth 2 (two) times daily. 02/14/23  Yes Dew, Marlow Baars, MD  sacubitril-valsartan (ENTRESTO) 24-26 MG Take 1 tablet by mouth every 12 (twelve) hours. 01/28/23 01/28/24 Yes [provider]  senna-docusate (SENOKOT-S) 8.6-50 MG tablet Take 1 tablet by mouth at bedtime as needed for mild constipation. 06/17/21  Yes Burnadette Pop, MD  spironolactone (ALDACTONE) 50 MG tablet Take 1 tablet (50 mg total) by mouth daily. 11/26/22 01/01/24 Yes Delfino Lovett, MD  sulfamethoxazole-trimethoprim (BACTRIM DS) 800-160 MG tablet Take 1 tablet by mouth 2 (two) times daily for 10 days. 04/15/23 04/25/23 Yes Sharman Cheek, MD  thiamine (VITAMIN B-1) 100 MG tablet Take 1 tablet (100 mg total) by mouth daily. 02/20/23  Yes Arnetha Courser, MD  torsemide (DEMADEX) 20 MG tablet Take 1 tablet (20 mg total) by mouth daily. 01/20/23 04/20/23 Yes Gillis Santa, MD  traZODone (DESYREL) 50 MG tablet Take 50 mg by mouth at bedtime.   Yes [provider]  Ensure Max Protein (ENSURE MAX PROTEIN) LIQD Take 330 mLs (11 oz total) by mouth 2 (two) times daily. Patient taking differently: Take 11 oz by mouth daily. 11/26/22   Delfino Lovett, MD  oxyCODONE (OXY IR/ROXICODONE) 5 MG immediate release tablet Take 1 tablet (5 mg total) by mouth every 4 (four) hours as needed for moderate pain (pain score 4-6). Patient not taking: Reported on 04/19/2023 02/19/23   Arnetha Courser, MD    Hospital Medications: Reviewed. Current Facility-Administered Medications  Medication Dose Route Frequency Provider Last Rate Last Admin   acetaminophen (TYLENOL) tablet 650 mg  650 mg Oral Q6H PRN Mansy, Jan A, MD       Or   acetaminophen (TYLENOL) suppository 650 mg  650 mg Rectal Q6H PRN  Mansy, Vernetta Honey, MD       aspirin EC tablet 81 mg  81 mg Oral Daily Mansy, Jan A, MD   81 mg at 04/20/23 1316   atorvastatin (LIPITOR) tablet 80 mg  80 mg Oral QHS Mansy, Jan A, MD   80 mg at 04/19/23 2207   carvedilol (COREG) tablet 25 mg  25 mg Oral BID WC Mansy, Jan A, MD   25 mg at 04/20/23 1610   cefTRIAXone (ROCEPHIN) 2 g in sodium chloride 0.9 % 100 mL IVPB  2 g Intravenous Q24H Mansy, Jan A, MD 200 mL/hr at 04/20/23 1326 2 g at 04/20/23 1326   clopidogrel (PLAVIX) tablet 75 mg  75 mg Oral Daily Mansy, Jan A, MD   75 mg at 04/19/23 9604   colchicine tablet 0.6 mg  0.6 mg Oral BID Marinda Elk, MD   0.6 mg at 04/20/23 1317   dapagliflozin propanediol (FARXIGA) tablet 10 mg  10 mg Oral Daily Mansy, Jan A, MD   10 mg at 04/19/23 1041   enoxaparin (LOVENOX) injection 40 mg  40 mg Subcutaneous Q24H Mansy, Jan A, MD   40 mg at 04/20/23 1412   folic acid (FOLVITE) tablet 1 mg  1 mg Oral Daily Mansy, Jan A, MD   1 mg at 04/20/23 1318   hydrALAZINE (APRESOLINE) tablet 25 mg  25 mg Oral Q6H PRN Marinda Elk, MD       insulin aspart (novoLOG) injection 0-20 Units  0-20 Units Subcutaneous TID WC Mansy, Jan A, MD   3 Units at 04/19/23 1811   insulin aspart (novoLOG) injection 0-5 Units  0-5 Units Subcutaneous QHS Mansy, Jan A, MD       insulin aspart protamine- aspart (NOVOLOG MIX 70/30) injection 30 Units  30 Units Subcutaneous BID WC Mansy, Jan A, MD   30 Units at 04/19/23 1848   iron polysaccharides (NIFEREX) capsule 150 mg  150 mg Oral Daily Mansy, Jan A, MD   150 mg at 04/20/23 1318   mirtazapine (REMERON) tablet 15 mg  15 mg Oral QHS Mansy, Jan A, MD   15 mg at 04/19/23 2207   morphine (PF) 4 MG/ML injection 4 mg  4 mg Intravenous Q4H PRN Marinda Elk, MD   4 mg at 04/20/23 0134   multivitamin with minerals tablet 1 tablet  1 tablet Oral Daily Mansy, Jan A, MD   1 tablet at 04/20/23 1319   mupirocin ointment (BACTROBAN) 2 % 1 Application  1 Application Nasal BID Marinda Elk, MD   1 Application at 04/20/23 1319   ondansetron (ZOFRAN) tablet 4 mg  4 mg Oral Q6H PRN Mansy, Jan A, MD       Or   ondansetron [facility]) injection 4 mg  4 mg Intravenous Q6H PRN Mansy, Jan A, MD       oxyCODONE-acetaminophen (PERCOCET/ROXICET) 5-325 MG per tablet 1 tablet  1 tablet Oral Q6H PRN Mansy, Jan A, MD   1 tablet at 04/20/23 0503   pregabalin (LYRICA) capsule 75 mg  75 mg Oral BID Mansy, Jan A, MD   75 mg at 04/20/23 1316   protein supplement (ENSURE MAX) liquid  11 oz Oral Daily Mansy, Jan A, MD   11 oz at 04/20/23 1316   senna-docusate (Senokot-S) tablet 1 tablet  1 tablet Oral QHS PRN Mansy, Jan A, MD       tamsulosin [facility]) capsule 0.4 mg  0.4 mg Oral QPC supper Mansy, Jan A, MD   0.4 mg at 04/19/23 1810   thiamine (VITAMIN B1) tablet 100 mg  100 mg Oral Daily Mansy, Jan A, MD   100 mg at 04/20/23 1318   traZODone (DESYREL) tablet 25 mg  25 mg Oral QHS PRN Mansy, Jan A, MD       vancomycin (VANCOREADY) IVPB 1250 mg/250 mL  1,250 mg Intravenous Q24H Bari Mantis A, RPH 166.7 mL/hr at 04/20/23 0816 1,250 mg at 04/20/23 0816    Allergies: Reviewed. No Known Allergies  Review of Systems: General ROS: negative for - fever Psychological ROS: negative Ophthalmic ROS: negative ENT ROS: negative Allergy and Immunology ROS: negative Hematological and Lymphatic ROS: negative Endocrine ROS: negative Cardiovascular ROS: negative Gastrointestinal ROS: negative Musculoskeletal ROS: negative except HPI Neurological ROS: negative Dermatological ROS: negative  Physical Exam:    04/20/2023    1:05 PM 04/20/2023   12:45 PM 04/20/2023   12:30 PM  Vitals with BMI  Systolic 135 131 161  Diastolic 57 67 64  Pulse 65 50 66      General Appearance: alert, appears stated age, cooperative, in no acute distress Head: Normocephalic, without obvious abnormality, atraumatic Eyes: No scleral injection or drainage Ears: Gross hearing intact Neck: No JVD, supple,  symmetrical, trachea midline Lungs: Non-labored breathing Heart: Regular rate and rhythm Extremities:  Right knee: His operative bandage on the right leg from the BKA. Was able to expose the knee and the knee is swollen and tender and has warmth to touch.  There is no significant erythema.  The skin is intact throughout.  There are no wounds. Skin: Normal except as noted in examination Neurologic: Grossly normal   Imaging/Diagnostics: X-ray reviewed from 1224.  Degenerative changes and severe vascular disease noted.  No acute fracture   Labs: I have reviewed all of the pertinent labs in the patient chart. WBC  Date Value Ref Range Status  04/19/2023 15.6 (H) 4.0 - 10.5 K/uL Final   Hemoglobin  Date Value Ref Range Status  04/19/2023 10.3 (L) 13.0 - 17.0 g/dL Final  HGB  Date Value Ref Range Status  02/16/2012 15.7 13.0 - 18.0 g/dL Final   HCT  Date Value Ref Range Status  04/19/2023 30.1 (L) 39.0 - 52.0 % Final  02/16/2012 44.4 40.0 - 52.0 % Final   Sodium  Date Value Ref Range Status  04/20/2023 130 (L) 135 - 145 mmol/L Final  02/16/2012 137 136 - 145 mmol/L Final   Potassium  Date Value Ref Range Status  04/20/2023 4.6 3.5 - 5.1 mmol/L Final  02/16/2012 3.8 3.5 - 5.1 mmol/L Final   Chloride  Date Value Ref Range Status  04/20/2023 102 98 - 111 mmol/L Final  02/16/2012 102 98 - 107 mmol/L Final   BUN  Date Value Ref Range Status  04/20/2023 50 (H) 8 - 23 mg/dL Final  16/01/9603 10 7 - 18 mg/dL Final   INR  Date Value Ref Range Status  04/19/2023 1.4 (H) 0.8 - 1.2 Final    Comment:    (NOTE) INR goal varies based on device and disease states. Performed at [facility], [address] [place], [place] [address]    B Natriuretic Peptide  Date Value Ref Range Status  02/14/2023 445.2 (H) 0.0 - 100.0 pg/mL Final    Comment:    Performed at [facility], [address] [place], [place] [address]   CK-MB  Date Value Ref Range  Status  02/16/2012 1.9 0.5 - 3.6 ng/mL Final   TSH  Date Value Ref Range Status  04/19/2023 1.416 0.350 - 4.500 uIU/mL Final    Comment:    Performed by a 3rd Generation assay with a functional sensitivity of <=0.01 uIU/mL. 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  Part of his presentation was knee pain.  He had trauma few days ago.  X-rays were negative for fracture.  There was concern given his pain out of proportion that this could be severe inflammation from gout and/or possibly infection.  Infection was not ruled out at this time.  I discussed with the patient my recommendation for aspiration to test the fluid for crystals and for infection.  Pros and cons were discussed he desired to proceed and consent was obtained from the patient.  His wife was in agreement as well.  The left superior lateral aspect of the knee was cleaned and sterilely prepared and a 21-gauge needle was inserted and 20 cc of fluid was retrieved.  This helped decompress the knee a little bit he felt better.  There was still some residual pain.  The fluid was hemorrhagic and did not appear to be infectious.  It was sent to the laboratory for Gram stain, cell count, crystal analysis, and culture.  Right knee synovial fluid sent 3 PM December 29.  His operative bandage was replaced in that area of the knee.  The patient tolerated the procedure well.  He understands the got tested and additional recommendations will be provided depending on the results of the fluid analysis.  All questions answered from patient and wife.   Will f/u on fluid analysis.  Thank you.   Nada Boozer, MD, Orthopedic Surgery Locums     Signed: Nada Boozer, MD, Orthopedic  Surgeon Locums  12/29/20243:08 PM  Note: Dictation was assisted using  Chartered certified accountant.There may be uncorrected typographical errors within the document.  Please be aware of this information when reading or considering this document. Thank you.

## 2023-04-20 NOTE — Op Note (Signed)
° °  OPERATIVE NOTE   PROCEDURE: Right below-the-knee amputation  PRE-OPERATIVE DIAGNOSIS: Right foot gangrene  POST-OPERATIVE DIAGNOSIS: same as above  SURGEON:Mikaelah Trostle, MD  ASSISTANT(S): None  ANESTHESIA: general  ESTIMATED BLOOD LOSS: 300 cc  FINDING(S): Gas gangrene right TMA  SPECIMEN(S):  Right below-the-knee amputation  INDICATIONS:   Manuel [name] is a 77 y.o. male who presents with right leg gangrene.  The patient is scheduled for a right below-the-knee amputation.  I discussed in depth with the patient the risks, benefits, and alternatives to this procedure.  The patient is aware that the risk of this operation included but are not limited to:  bleeding, infection, myocardial infarction, stroke, death, failure to heal amputation wound, and possible need for more proximal amputation.  The patient is aware of the risks and agrees proceed forward with the procedure.  DESCRIPTION:  After full informed written consent was obtained from the patient, the patient was brought back to the operating room, and placed supine upon the operating table.  Prior to induction, the patient received IV antibiotics.  The patient was then prepped and draped in the standard fashion for a below-the-knee amputation.  After obtaining adequate anesthesia, the patient was prepped and draped in the standard fashion for a right below-the-knee amputation.  I marked out the anterior incision two finger breadths below the tibial tuberosity and then the marked out a posterior flap that was one third of the circumference of the calf in length.   I made the incisions for these flaps, and then dissected through the subcutaneous tissue, fascia, and muscle anteriorly.  I elevated  the periosteal tissue superiorly so that the tibia was about 3-4 cm shorter than the anterior skin flap.  I then transected the tibia with a power saw and then took a wedge off the tibia anteriorly with the power saw.  Then I smoothed  out the rough edges.  In a similar fashion, I cut back the fibula about two centimeters higher than the level of the tibia with a bone cutter.  I put a bone hook into the distal tibia and then used a large amputation knife to sharply develop a tissue plane through the muscle along the fibula.  In such fashion, the posterior flap was developed.  At this point, the specimen was passed off the field as the below-the-knee amputation.  At this point, I clamped all visibly bleeding arteries and veins using a combination of suture ligation with Silk suture and electrocautery.  Bleeding continued to be controlled with electrocautery and suture ligature.  The stump was washed off with sterile normal saline and no further active bleeding was noted.  I reapproximated the anterior and posterior fascia  with interrupted stitches of 0 Vicryl.  This was completed along the entire length of anterior and posterior fascia until there were no more loose space in the fascial line. I then placed a layer of 2-0 Vicryl sutures in the subcutaneous tissue. The skin was then  reapproximated with staples.  The stump was washed off and dried.  The incision was dressed with Xeroform and  then fluffs were applied.  Kerlix was wrapped around the leg and then gently an ACE wrap was applied.    COMPLICATIONS: none  CONDITION: stable   Manuel [name] A  04/20/2023, 12:12 PM    This note was created with Dragon Medical transcription system. Any errors in dictation are purely unintentional.

## 2023-04-20 NOTE — Plan of Care (Signed)
°  Problem: Fluid Volume: Goal: Hemodynamic stability will improve Outcome: Progressing   Problem: Clinical Measurements: Goal: Diagnostic test results will improve Outcome: Progressing   Problem: Coping: Goal: Ability to adjust to condition or change in health will improve Outcome: Progressing   Problem: Health Behavior/Discharge Planning: Goal: Ability to manage health-related needs will improve Outcome: Progressing   Problem: Nutritional: Goal: Maintenance of adequate nutrition will improve Outcome: Progressing   Problem: Tissue Perfusion: Goal: Adequacy of tissue perfusion will improve Outcome: Progressing   Problem: Coping: Goal: Level of anxiety will decrease Outcome: Progressing   Problem: Pain Management: Goal: General experience of comfort will improve Outcome: Progressing

## 2023-04-21 ENCOUNTER — Inpatient Hospital Stay: Payer: Medicare Other | Admitting: Anesthesiology

## 2023-04-21 ENCOUNTER — Encounter: Payer: Self-pay | Admitting: Vascular Surgery

## 2023-04-21 ENCOUNTER — Encounter
Admission: EM | Disposition: A | Discharge status: Skilled Nursing Facility | Payer: Self-pay | Source: Home / Self Care | Attending: Internal Medicine

## 2023-04-21 DIAGNOSIS — M86261 Subacute osteomyelitis, right tibia and fibula: Secondary | ICD-10-CM | POA: Diagnosis not present

## 2023-04-21 DIAGNOSIS — M009 Pyogenic arthritis, unspecified: Secondary | ICD-10-CM

## 2023-04-21 HISTORY — PX: IRRIGATION AND DEBRIDEMENT KNEE: SHX5185

## 2023-04-21 LAB — GLUCOSE, CAPILLARY
Glucose-Capillary: 80 mg/dL (ref 70–99)
Glucose-Capillary: 180 mg/dL — ABNORMAL HIGH (ref 70–99)
Glucose-Capillary: 189 mg/dL — ABNORMAL HIGH (ref 70–99)
Glucose-Capillary: 167 mg/dL — ABNORMAL HIGH (ref 70–99)
Glucose-Capillary: 182 mg/dL — ABNORMAL HIGH (ref 70–99)
Glucose-Capillary: 178 mg/dL — ABNORMAL HIGH (ref 70–99)
Glucose-Capillary: 122 mg/dL — ABNORMAL HIGH (ref 70–99)

## 2023-04-21 LAB — CBC WITH DIFFERENTIAL/PLATELET
Abs Immature Granulocytes: 0.13 10*3/uL — ABNORMAL HIGH (ref 0.00–0.07)
Basophils Absolute: 0 10*3/uL (ref 0.0–0.1)
Basophils Relative: 0 %
Eosinophils Absolute: 0.2 10*3/uL (ref 0.0–0.5)
Eosinophils Relative: 1 %
HCT: 24.4 % — ABNORMAL LOW (ref 39.0–52.0)
Hemoglobin: 8.1 g/dL — ABNORMAL LOW (ref 13.0–17.0)
Immature Granulocytes: 1 %
Lymphocytes Relative: 16 %
Lymphs Abs: 2.2 10*3/uL (ref 0.7–4.0)
MCH: 28.5 pg (ref 26.0–34.0)
MCHC: 33.2 g/dL (ref 30.0–36.0)
MCV: 85.9 fL (ref 80.0–100.0)
Monocytes Absolute: 1.4 10*3/uL — ABNORMAL HIGH (ref 0.1–1.0)
Monocytes Relative: 10 %
Neutro Abs: 9.9 10*3/uL — ABNORMAL HIGH (ref 1.7–7.7)
Neutrophils Relative %: 72 %
Platelets: 336 10*3/uL (ref 150–400)
RBC: 2.84 MIL/uL — ABNORMAL LOW (ref 4.22–5.81)
RDW: 16.8 % — ABNORMAL HIGH (ref 11.5–15.5)
WBC: 13.9 10*3/uL — ABNORMAL HIGH (ref 4.0–10.5)
nRBC: 0 % (ref 0.0–0.2)

## 2023-04-21 LAB — BASIC METABOLIC PANEL
Anion gap: 9 (ref 5–15)
BUN: 36 mg/dL — ABNORMAL HIGH (ref 8–23)
CO2: 18 mmol/L — ABNORMAL LOW (ref 22–32)
Calcium: 8 mg/dL — ABNORMAL LOW (ref 8.9–10.3)
Chloride: 104 mmol/L (ref 98–111)
Creatinine, Ser: 0.89 mg/dL (ref 0.61–1.24)
GFR, Estimated: >60 mL/min (ref >60)
Glucose, Bld: 182 mg/dL — ABNORMAL HIGH (ref 70–99)
Potassium: 5 mmol/L (ref 3.5–5.1)
Sodium: 131 mmol/L — ABNORMAL LOW (ref 135–145)

## 2023-04-21 LAB — CREATININE, SERUM
Creatinine, Ser: 0.84 mg/dL (ref 0.61–1.24)
GFR, Estimated: >60 mL/min (ref >60)

## 2023-04-21 SURGERY — IRRIGATION AND DEBRIDEMENT KNEE
Anesthesia: General | Site: Knee | Laterality: Right

## 2023-04-21 MED ORDER — ONDANSETRON HCL 4 MG/2ML IJ SOLN
INTRAMUSCULAR | Status: DC | PRN
  Administered 2023-04-21: 4 mg via INTRAVENOUS

## 2023-04-21 MED ORDER — SIMETHICONE 80 MG PO CHEW
80.0000 mg | CHEWABLE_TABLET | Freq: Four times a day (QID) | ORAL | Status: DC | PRN
  Administered 2023-04-21 – 2023-04-24 (×5): 80 mg via ORAL
  Filled 2023-04-21 (×7): qty 1

## 2023-04-21 MED ORDER — FENTANYL CITRATE (PF) 100 MCG/2ML IJ SOLN
INTRAMUSCULAR | Status: DC | PRN
  Administered 2023-04-21: 12.5 ug via INTRAVENOUS
  Administered 2023-04-21: 25 ug via INTRAVENOUS
  Administered 2023-04-21: 12.5 ug via INTRAVENOUS
  Administered 2023-04-21: 25 ug via INTRAVENOUS

## 2023-04-21 MED ORDER — DROPERIDOL 2.5 MG/ML IJ SOLN: 0.6250 mg | Freq: Once | INTRAMUSCULAR | Status: DC | PRN

## 2023-04-21 MED ORDER — OXYCODONE HCL 5 MG PO TABS
ORAL_TABLET | ORAL | Status: AC
  Filled 2023-04-21: qty 1

## 2023-04-21 MED ORDER — ACETAMINOPHEN 10 MG/ML IV SOLN
INTRAVENOUS | Status: AC
  Filled 2023-04-21: qty 100

## 2023-04-21 MED ORDER — PROPOFOL 500 MG/50ML IV EMUL
INTRAVENOUS | Status: DC | PRN
  Administered 2023-04-21: 100 ug/kg/min via INTRAVENOUS

## 2023-04-21 MED ORDER — FENTANYL CITRATE (PF) 100 MCG/2ML IJ SOLN
INTRAMUSCULAR | Status: AC
  Filled 2023-04-21: qty 2

## 2023-04-21 MED ORDER — ACETAMINOPHEN 10 MG/ML IV SOLN
1000.0000 mg | Freq: Once | INTRAVENOUS | Status: DC | PRN
Start: 2023-04-21 — End: 2023-04-21
  Administered 2023-04-21: 1000 mg via INTRAVENOUS

## 2023-04-21 MED ORDER — MENTHOL 3 MG MT LOZG
1.0000 | LOZENGE | OROMUCOSAL | Status: DC | PRN
  Filled 2023-04-21: qty 9

## 2023-04-21 MED ORDER — SODIUM CHLORIDE 0.9 % IV SOLN: INTRAVENOUS | Status: DC | PRN

## 2023-04-21 MED ORDER — FENTANYL CITRATE (PF) 100 MCG/2ML IJ SOLN
25.0000 ug | INTRAMUSCULAR | Status: AC | PRN
  Administered 2023-04-21 (×6): 25 ug via INTRAVENOUS

## 2023-04-21 MED ORDER — 0.9 % SODIUM CHLORIDE (POUR BTL) OPTIME
TOPICAL | Status: DC | PRN
  Administered 2023-04-21: 3000 mL

## 2023-04-21 MED ORDER — PHENYLEPHRINE 80 MCG/ML (10ML) SYRINGE FOR IV PUSH (FOR BLOOD PRESSURE SUPPORT)
PREFILLED_SYRINGE | INTRAVENOUS | Status: AC
  Filled 2023-04-21: qty 10

## 2023-04-21 MED ORDER — PROPOFOL 1000 MG/100ML IV EMUL
INTRAVENOUS | Status: AC
  Filled 2023-04-21: qty 100

## 2023-04-21 MED ORDER — PROPOFOL 10 MG/ML IV BOLUS
INTRAVENOUS | Status: AC
  Filled 2023-04-21: qty 20

## 2023-04-21 MED ORDER — OXYCODONE HCL 5 MG PO TABS
5.0000 mg | ORAL_TABLET | Freq: Once | ORAL | Status: AC | PRN
  Administered 2023-04-21: 5 mg via ORAL

## 2023-04-21 MED ORDER — PHENYLEPHRINE 80 MCG/ML (10ML) SYRINGE FOR IV PUSH (FOR BLOOD PRESSURE SUPPORT)
PREFILLED_SYRINGE | INTRAVENOUS | Status: DC | PRN
  Administered 2023-04-21 (×2): 160 ug via INTRAVENOUS
  Administered 2023-04-21: 80 ug via INTRAVENOUS
  Administered 2023-04-21: 160 ug via INTRAVENOUS

## 2023-04-21 MED ORDER — ONDANSETRON HCL 4 MG/2ML IJ SOLN
INTRAMUSCULAR | Status: AC
  Filled 2023-04-21: qty 2

## 2023-04-21 MED ORDER — LIDOCAINE HCL (PF) 2 % IJ SOLN
INTRAMUSCULAR | Status: AC
  Filled 2023-04-21: qty 5

## 2023-04-21 MED ORDER — EPHEDRINE SULFATE-NACL 50-0.9 MG/10ML-% IV SOSY
PREFILLED_SYRINGE | INTRAVENOUS | Status: DC | PRN
  Administered 2023-04-21 (×2): 7.5 mg via INTRAVENOUS
  Administered 2023-04-21: 10 mg via INTRAVENOUS

## 2023-04-21 MED ORDER — LIDOCAINE HCL (CARDIAC) PF 100 MG/5ML IV SOSY
PREFILLED_SYRINGE | INTRAVENOUS | Status: DC | PRN
  Administered 2023-04-21: 20 mg via INTRAVENOUS

## 2023-04-21 MED ORDER — PROPOFOL 10 MG/ML IV BOLUS
INTRAVENOUS | Status: DC | PRN
  Administered 2023-04-21: 50 mg via INTRAVENOUS
  Administered 2023-04-21 (×2): 30 mg via INTRAVENOUS

## 2023-04-21 MED ORDER — OXYCODONE HCL 5 MG/5ML PO SOLN: 5.0000 mg | Freq: Once | ORAL | Status: AC | PRN

## 2023-04-21 SURGICAL SUPPLY — 36 items
BLADE SURG SZ10 CARB STEEL (BLADE) ×1 IMPLANT
BNDG ELASTIC 4INX 5YD STR LF (GAUZE/BANDAGES/DRESSINGS) ×1 IMPLANT
BNDG ESMARCH 4X12 STRL LF (GAUZE/BANDAGES/DRESSINGS) ×1 IMPLANT
CHLORAPREP W/TINT 26 (MISCELLANEOUS) ×1 IMPLANT
CUFF TOURN SGL QUICK 18X4 (TOURNIQUET CUFF) IMPLANT
CUFF TOURN SGL QUICK 30 NS (TOURNIQUET CUFF) IMPLANT
CUFF TRNQT CYL 24X4X16.5-23 (TOURNIQUET CUFF) IMPLANT
DRAPE XRAY CASSETTE 23X24 (DRAPES) IMPLANT
ELECT REM PT RETURN 9FT ADLT (ELECTROSURGICAL) ×1 IMPLANT
ELECTRODE REM PT RTRN 9FT ADLT (ELECTROSURGICAL) ×1 IMPLANT
GLOVE BIO SURGEON STRL SZ8 (GLOVE) ×1 IMPLANT
GLOVE INDICATOR 8.0 STRL GRN (GLOVE) ×1 IMPLANT
GLOVE SURG ORTHO 8.5 STRL (GLOVE) ×1 IMPLANT
GOWN STRL REUS W/ TWL LRG LVL3 (GOWN DISPOSABLE) ×1 IMPLANT
GOWN STRL REUS W/ TWL XL LVL3 (GOWN DISPOSABLE) ×1 IMPLANT
IV NS IRRIG 3000ML ARTHROMATIC (IV SOLUTION) IMPLANT
KIT TURNOVER KIT A (KITS) ×1 IMPLANT
LABEL OR SOLS (LABEL) ×1 IMPLANT
MANIFOLD NEPTUNE II (INSTRUMENTS) ×1 IMPLANT
NDL SAFETY ECLIPSE 18X1.5 (NEEDLE) ×1 IMPLANT
NS IRRIG 1000ML POUR BTL (IV SOLUTION) ×1 IMPLANT
PACK EXTREMITY ARMC (MISCELLANEOUS) ×1 IMPLANT
PAD CAST 4YDX4 CTTN HI CHSV (CAST SUPPLIES) ×1 IMPLANT
PADDING CAST BLEND 4X4 STRL (MISCELLANEOUS) ×1 IMPLANT
PULSAVAC PLUS IRRIG FAN TIP (DISPOSABLE) ×1 IMPLANT
SPLINT CAST 1 STEP 3X12 (MISCELLANEOUS) ×1 IMPLANT
SPONGE T-LAP 18X18 ~~LOC~~+RFID (SPONGE) ×1 IMPLANT
STAPLER SKIN PROX 35W (STAPLE) ×1 IMPLANT
STOCKINETTE BIAS CUT 4 980044 (GAUZE/BANDAGES/DRESSINGS) ×1 IMPLANT
STOCKINETTE IMPERVIOUS 9X36 MD (GAUZE/BANDAGES/DRESSINGS) ×1 IMPLANT
SUT MON AB 2-0 CT1 36 (SUTURE) IMPLANT
SUT PROLENE 4 0 PS 2 18 (SUTURE) ×2 IMPLANT
SYR 10ML LL (SYRINGE) ×1 IMPLANT
TIP FAN IRRIG PULSAVAC PLUS (DISPOSABLE) IMPLANT
TRAP FLUID SMOKE EVACUATOR (MISCELLANEOUS) ×1 IMPLANT
WATER STERILE IRR 500ML POUR (IV SOLUTION) ×1 IMPLANT

## 2023-04-21 NOTE — Transfer of Care (Signed)
Immediate Anesthesia Transfer of Care Note  Patient: Manuel [name]  Procedure(s) Performed: IRRIGATION AND DEBRIDEMENT KNEE (Right: Knee)  Patient Location: PACU  Anesthesia Type:General  Level of Consciousness: drowsy  Airway & Oxygen Therapy: Patient Spontanous Breathing and Patient connected to face mask oxygen  Post-op Assessment: Report given to RN and Post -op Vital signs reviewed and stable  Post vital signs: Reviewed and stable  Last Vitals:  Vitals Value Taken Time  BP 95/51 04/21/23 1022  Temp 36 C 04/21/23 1022  Pulse 77 04/21/23 1025  Resp 19 04/21/23 1025  SpO2 100 % 04/21/23 1025  Vitals shown include unfiled device data.  Last Pain:  Vitals:   04/21/23 0825  TempSrc: Oral  PainSc: 0-No pain      Patients Stated Pain Goal: 2 (04/21/23 0424)  Complications: No notable events documented.

## 2023-04-21 NOTE — Hospital Course (Addendum)
HPI: Manuel [name] is a 77 y.o. Caucasian male with medical history significant for osteoarthritis, coronary artery disease, essential hypertension, carotid artery disease, DDD, type 2 diabetes mellitus, gout, dyslipidemia and ischemic cardiomyopathy as well as OSA and peripheral neuropathy, who presented to the emergency room with acute onset of altered mental status with confusion.  He had a right foot transmetatarsal amputation by Dr. Wyn Quaker this summer.  Since then he has had an ulcer which has been draining.  He admitted to associated right foot pain with swelling and erythema.  He was seen in the ER for mechanical fall 4 days ago and was given p.o. Bactrim and Keflex which he has been taking but missed Christmas Day.  He was having mild AKI then.  He admitted to diminish fluid intake likely.   Brief Hospital course:  77yo with h/o CAD, HTN, DM, and chronic systolic CHF who presented on 52/84 with AMS and wound infection s/p recent I&D of R groin wound and prior R 4th/5th partial ray amputation (7/29).  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  He then underwent R knee I&D for septic/gouty arthritis on 12/30.  He will need SNF rehab.  Vascular surgery is recommending ASA 81 mg daily + Plavix and Lipitor.  ID is consulting for antibiotic recommendations.  As per Dr. Mayford Knife 1/8-1/14/24: Pt has remained medically stable this week. Psych re-evaluated the pt and rescinded IVC and pt no longer meets inpatient psych criteria. [facility]itter was d/c on 05/05/23. PT/OT recs SNF. Pt chose [facility] and insurance Berkley Harvey is pending as per CM.   Significant Events: Admitted 04/18/2023 for sepsis due to cellulitis of his right LE stump   Significant Labs: hyponatremia 123 and hypochloremia of 90 with a CO2 of 21 blood glucose of 249, BUN of 71 and creatinine 1.98 up from 48 and 1.51 on 12/24 and albumin 3.2 with AST 55 and ALT 48. CBC showed leukocytosis 16.5 and anemia. Procalcitonin was elevated 2.13.  Lactic acid was 1.1. UA showed more than 500 glucose.   Significant Imaging Studies: Right lower extremity ultrasound showed no DVT.   Antibiotic Therapy: Anti-infectives (From admission, onward)    Start     Dose/Rate Route Frequency Ordered Stop   05/08/23 0000  cefadroxil (DURICEF) 500 MG capsule        1,000 mg Oral 2 times daily 05/08/23 1259 05/22/23 2359   05/08/23 0000  doxycycline (VIBRA-TABS) 100 MG tablet        100 mg Oral Every 12 hours 05/08/23 1259 05/22/23 2359   05/07/23 1000  cefadroxil (DURICEF) capsule 1,000 mg        1,000 mg Oral 2 times daily 05/06/23 0902 05/21/23 0959   05/07/23 0800  doxycycline (VIBRA-TABS) tablet 100 mg        100 mg Oral Every 12 hours 05/06/23 0902 05/21/23 0759   05/03/23 1400  vancomycin (VANCOREADY) IVPB 1500 mg/300 mL        1,500 mg 150 mL/hr over 120 Minutes Intravenous Every 24 hours 05/02/23 0841 05/07/23 1045   04/26/23 1400  cefTRIAXone (ROCEPHIN) 2 g in sodium chloride 0.9 % 100 mL IVPB        2 g 200 mL/hr over 30 Minutes Intravenous Every 24 hours 04/25/23 1720 05/07/23 1740   04/26/23 0600  vancomycin (VANCOREADY) IVPB 2000 mg/400 mL  Status:  Discontinued        2,000 mg 200 mL/hr over 120 Minutes Intravenous Every 24 hours 04/25/23  1720 05/02/23 0841   04/24/23 0600  vancomycin (VANCOREADY) IVPB 2000 mg/400 mL        2,000 mg 200 mL/hr over 120 Minutes Intravenous Every 24 hours 04/23/23 1314 04/25/23 0728   04/23/23 0830  vancomycin (VANCOREADY) IVPB 1500 mg/300 mL  Status:  Discontinued        1,500 mg 150 mL/hr over 120 Minutes Intravenous Every 24 hours 04/22/23 0957 04/23/23 1314   04/20/23 0830  vancomycin (VANCOREADY) IVPB 1250 mg/250 mL  Status:  Discontinued        1,250 mg 166.7 mL/hr over 90 Minutes Intravenous Every 24 hours 04/20/23 0750 04/22/23 0957   04/20/23 0800  vancomycin (VANCOCIN) IVPB 1000 mg/200 mL premix  Status:  Discontinued        1,000 mg 200 mL/hr over 60 Minutes Intravenous Every 24  hours 04/19/23 1440 04/20/23 0750   04/19/23 1400  cefTRIAXone (ROCEPHIN) 2 g in sodium chloride 0.9 % 100 mL IVPB        2 g 200 mL/hr over 30 Minutes Intravenous Every 24 hours 04/19/23 0228 04/25/23 1454   04/19/23 0800  vancomycin (VANCOREADY) IVPB 750 mg/150 mL  Status:  Discontinued        750 mg 150 mL/hr over 60 Minutes Intravenous Every 24 hours 04/19/23 0237 04/19/23 1440   04/19/23 0230  vancomycin (VANCOCIN) IVPB 1000 mg/200 mL premix  Status:  Discontinued        1,000 mg 200 mL/hr over 60 Minutes Intravenous  Once 04/19/23 0228 04/19/23 0230   04/19/23 0130  ceFEPIme (MAXIPIME) 2 g in sodium chloride 0.9 % 100 mL IVPB        2 g 200 mL/hr over 30 Minutes Intravenous  Once 04/19/23 0121 04/19/23 0240   04/19/23 0130  Vancomycin (VANCOCIN) 1,500 mg in sodium chloride 0.9 % 500 mL IVPB        1,500 mg 250 mL/hr over 120 Minutes Intravenous  Once 04/19/23 0121 04/19/23 0438       Procedures: 04-20-2023 right BKA 04-21-2023 irrigation/debridement right knee  Consultants: Psych Orthopedics Infectious Disease

## 2023-04-21 NOTE — Anesthesia Preprocedure Evaluation (Addendum)
Anesthesia Evaluation  Patient identified by MRN, date of birth, ID band Patient confused    Reviewed: Allergy & Precautions, H&P , NPO status , Patient's Chart, lab work & pertinent test results  History of Anesthesia Complications Negative for: history of anesthetic complications  Airway Mallampati: III  TM Distance: >3 FB Neck ROM: full    Dental  (+) Edentulous Upper, Edentulous Lower   Pulmonary sleep apnea , Patient abstained from smoking.   Pulmonary exam normal        Cardiovascular Exercise Tolerance: Poor hypertension, (-) angina + CAD, + Past MI, + Cardiac Stents, + Peripheral Vascular Disease and +CHF   + Peripheral Edema ECHO 11/14/22:  1. Left ventricular ejection fraction, by estimation, is 25 to 30%. The  left ventricle has severely decreased function. The left ventricle  demonstrates regional wall motion abnormalities (see scoring  diagram/findings for description). Left ventricular  diastolic parameters were normal.   2. Right ventricular systolic function is normal. The right ventricular  size is normal.   3. The mitral valve is normal in structure. Mild to moderate mitral valve  regurgitation. No evidence of mitral stenosis.   4. Tricuspid valve regurgitation is mild to moderate.   5. The aortic valve is normal in structure. Aortic valve regurgitation is  not visualized. No aortic stenosis is present.   6. The inferior vena cava is normal in size with greater than 50%  respiratory variability, suggesting right atrial pressure of 3 mmHg.    Bilateral carotid artery stenosis  EKG 7/21: Nonspecific interventricular conduction delay  MPS 10/2018: LVEF= 45%  FINDINGS:  Regional wall motion:  demonstrates  hypokinesis of the inferior wall.  The overall quality of the study is fair.   Artifacts noted: no  Left ventricular cavity: normal.     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   Possible acute gout flare of the right knee and ankle Exquisitely tender to palpation when I remove the sheets from the knee he started yelling in pain. Continue colchicine       Past Medical History: No date: Allergies No date: Arthritis No date: Diabetes mellitus without   Past Surgical History: 03/14/2021: CATARACT EXTRACTION W/PHACO; Right     Comment:  Procedure: CATARACT EXTRACTION PHACO AND INTRAOCULAR               LENS PLACEMENT (IOC) RIGHT DIABETIC;  Surgeon:               Lockie Mola, MD;  Location: [facility];              Service: Ophthalmology;  Laterality: Right;                Diabetic 16.78 01:39.9 03/28/2021: CATARACT EXTRACTION W/PHACO; Left     Comment:  Procedure:  CATARACT EXTRACTION PHACO AND INTRAOCULAR               LENS PLACEMENT (IOC) LEFT DIABETIC 6.93 01:22.0;                Surgeon: Lockie Mola, MD;  Location: [facility];  Service: Ophthalmology;  Laterality: Left;              Diabetic 05/14/2021: LOWER EXTREMITY ANGIOGRAPHY; Right     Comment:  Procedure: LOWER EXTREMITY ANGIOGRAPHY;  Surgeon: Annice Needy, MD;  Location: [facility] IN[facility];  Service:               Cardiovascular;  Laterality: Right; 06/14/2021: LOWER EXTREMITY  ANGIOGRAPHY; Right     Comment:  Procedure: Lower Extremity Angiography;  Surgeon: Annice Needy, MD;  Location: [facility] IN[facility];  Service:               Cardiovascular;  Laterality: Right; 11/11/2022: LOWER EXTREMITY ANGIOGRAPHY; Right     Comment:  Procedure: Lower Extremity Angiography;  Surgeon: Annice Needy, MD;  Location: [facility] IN[facility] CV [facility];  Service:               Cardiovascular;  Laterality: Right; 06/15/2021: LOWER EXTREMITY INTERVENTION; Right     Comment:  Procedure: LOWER EXTREMITY INTERVENTION;  Surgeon: Annice Needy, MD;  Location: [facility] INVAS[facility] CV [facility];  Service:               Cardiovascular;  Laterality: Right; No date: TONSILLECTOMY     Comment:  had removed as a child  BMI    Body Mass Index: 29.06 kg/m      Reproductive/Obstetrics negative OB ROS                             Anesthesia Physical Anesthesia Plan  ASA: 3  Anesthesia Plan: General   Post-op Pain Management: Ofirmev IV (intra-op)*   Induction: Intravenous  PONV Risk Score and Plan: 2 and Dexamethasone and Ondansetron  Airway Management Planned: LMA  Additional Equipment:   Intra-op Plan:   Post-operative Plan:   Informed Consent: I have reviewed the patients History and Physical, chart, labs and discussed the procedure including the risks, benefits and alternatives for the proposed anesthesia with the patient or authorized representative who has indicated his/her understanding and acceptance.   Patient has DNR.  Suspend DNR and Discussed DNR with patient.   Dental Advisory Given  Plan Discussed with: CRNA and Surgeon  Anesthesia Plan Comments:         Anesthesia Quick Evaluation

## 2023-04-21 NOTE — Op Note (Signed)
04/21/2023  10:18 AM  PATIENT:  Manuel [name]  77 y.o. male  PRE-OPERATIVE DIAGNOSIS:  RIGHT SEPTIC KNEE  POST-OPERATIVE DIAGNOSIS:  RIGHT SEPTIC KNEE  PROCEDURE: Incision and drainage of right knee joint  SURGEON:  Surgeons and Role:    * Brevyn Ring, Adelfa Koh, MD - Primary  PHYSICIAN ASSISTANT: None  ASSISTANTS: none   ANESTHESIA:   general  EBL:  2 mL   BLOOD ADMINISTERED:none  DRAINS: none   LOCAL MEDICATIONS USED:  NONE  SPECIMEN:  Source of Specimen:  Right knee joint infection culture sent  DISPOSITION OF SPECIMEN:  N/A  COUNTS:  YES  TOURNIQUET:  * Missing tourniquet times found for documented tourniquets in log: [id number] *  DICTATION: .Reubin Milan Dictation  PLAN OF CARE: Admit to inpatient   PATIENT DISPOSITION:  PACU - hemodynamically stable.   Delay start of Pharmacological VTE agent (>24hrs) due to surgical blood loss or risk of bleeding: no  The patient was checked in by the surgical team.  I had seen him this morning he was little bit confused but I explained to him about the results of the aspiration came back positive for infection.  We called the patient's wife to discuss everything by phone because the patient was little confused even though he was able to participate in the conversation.  I explained to the wife that the results of the aspiration were positive and surgery is recommended to try to preserve the knee joint particularly because he had a recent lower leg amputation.  Ongoing infection of the knee could lead to increased morbidity even risk of mortality and sepsis and even lead to indications for above-the-knee amputation in the future.  Recommendation was surgical washout the wife and I discussed that pros and cons risks and benefits were discussed she desired to proceed and witnessed telephone consent by 2 nurses was obtained and we moved forward with surgery.  He was checked in by the rest of the surgical team taken back into surgery and  given anesthesia.  He was receiving vancomycin at the time of surgery.  A surgical pause was done after the lower extremity was prepped and draped in the standard sterile fashion.  The case then begun  A lateral incision was made from the area of the mid patella extending down towards the knee joint.  This was about 4 to 5 cm.  A lateral parapatellar arthrotomy was made.  As we entered the joint pus erupted out of the joint.  A sample was sent for microbiology testing.  There was obvious pus in the joint.  We decompressed the joint manually first after the arthrotomy.  A Kelly clamp was then placed to hold open the capsule and have good access to the joint surface.  At this point pulse lavage and bulb syringe irrigation was done for a total of 4 L.  Initially there was frank pus in the joint at the end of the irrigation there was clear fluid and no remnants of ongoing infection was present.  This completed the washout of the knee joint.  During the washout we would manually flex and extend the knee and milk around the knee to try to get out all of the infectious material.  We were satisfied that we accomplish this as the material at the end was clear.  1 Monocryl suture was used to loosely close the capsule in the center and the capsule was otherwise left open.  1 dermal suture was placed to loosely bring  the skin together and then skin staples were placed to loosely close the skin.  Prior surgical site from the below the knee amputation was not disturbed and we redressed it with a bulky soft bandage.  He tolerated the procedure well.  There were no complications  This completed the washout of the right septic knee

## 2023-04-21 NOTE — Progress Notes (Signed)
Orthopedics progress note  Chief complaint right knee pain  Subjective, no acute events  Objective, chart reviewed A knee aspiration was performed yesterday with the following results: Gram stain positive for gram-positive cocci Crystals positive White blood cell count 124,000 Cultures pending  Knee bandages clean and dry.  Assessment and plan  77 year old male who is postoperative day #1 status post right BKA by the vascular service.  His knee aspiration is positive for crystals and bacterial infection.  I discussed this with the patient and that surgery is typically done to washout the knee joint.  Order placed for n.p.o.  Additional plan forthcoming from the orthopedic team.  I am off service today.   Thank you.   Nada Boozer, MD, Orthopedic Surgery Locums

## 2023-04-21 NOTE — Brief Op Note (Signed)
04/21/2023  10:18 AM  PATIENT:  Manuel [name]  77 y.o. male  PRE-OPERATIVE DIAGNOSIS:  RIGHT SEPTIC KNEE  POST-OPERATIVE DIAGNOSIS:  RIGHT SEPTIC KNEE  PROCEDURE: Incision and drainage of right knee joint  SURGEON:  Surgeons and Role:    * Brevyn Ring, Adelfa Koh, MD - Primary  PHYSICIAN ASSISTANT: None  ASSISTANTS: none   ANESTHESIA:   general  EBL:  2 mL   BLOOD ADMINISTERED:none  DRAINS: none   LOCAL MEDICATIONS USED:  NONE  SPECIMEN:  Source of Specimen:  Right knee joint infection culture sent  DISPOSITION OF SPECIMEN:  N/A  COUNTS:  YES  TOURNIQUET:  * Missing tourniquet times found for documented tourniquets in log: [id number] *  DICTATION: .Reubin Milan Dictation  PLAN OF CARE: Admit to inpatient   PATIENT DISPOSITION:  PACU - hemodynamically stable.   Delay start of Pharmacological VTE agent (>24hrs) due to surgical blood loss or risk of bleeding: no  The patient was checked in by the surgical team.  I had seen him this morning he was little bit confused but I explained to him about the results of the aspiration came back positive for infection.  We called the patient's wife to discuss everything by phone because the patient was little confused even though he was able to participate in the conversation.  I explained to the wife that the results of the aspiration were positive and surgery is recommended to try to preserve the knee joint particularly because he had a recent lower leg amputation.  Ongoing infection of the knee could lead to increased morbidity even risk of mortality and sepsis and even lead to indications for above-the-knee amputation in the future.  Recommendation was surgical washout the wife and I discussed that pros and cons risks and benefits were discussed she desired to proceed and witnessed telephone consent by 2 nurses was obtained and we moved forward with surgery.  He was checked in by the rest of the surgical team taken back into surgery and  given anesthesia.  He was receiving vancomycin at the time of surgery.  A surgical pause was done after the lower extremity was prepped and draped in the standard sterile fashion.  The case then begun  A lateral incision was made from the area of the mid patella extending down towards the knee joint.  This was about 4 to 5 cm.  A lateral parapatellar arthrotomy was made.  As we entered the joint pus erupted out of the joint.  A sample was sent for microbiology testing.  There was obvious pus in the joint.  We decompressed the joint manually first after the arthrotomy.  A Kelly clamp was then placed to hold open the capsule and have good access to the joint surface.  At this point pulse lavage and bulb syringe irrigation was done for a total of 4 L.  Initially there was frank pus in the joint at the end of the irrigation there was clear fluid and no remnants of ongoing infection was present.  This completed the washout of the knee joint.  During the washout we would manually flex and extend the knee and milk around the knee to try to get out all of the infectious material.  We were satisfied that we accomplish this as the material at the end was clear.  1 Monocryl suture was used to loosely close the capsule in the center and the capsule was otherwise left open.  1 dermal suture was placed to loosely bring  the skin together and then skin staples were placed to loosely close the skin.  Prior surgical site from the below the knee amputation was not disturbed and we redressed it with a bulky soft bandage.  He tolerated the procedure well.  There were no complications  This completed the washout of the right septic knee

## 2023-04-21 NOTE — Progress Notes (Signed)
PT Cancellation Note  Patient Details Name: Manuel [name] MRN: [id number] DOB: 07-31-1945   Cancelled Treatment:    Reason Eval/Treat Not Completed: Patient at procedure or test/unavailable.  PT consult received.  Chart reviewed.  S/p R BKA.  Pt currently off floor for procedure.  Will re-attempt PT evaluation at a later date/time.  Hendricks Limes, PT 04/21/23, 9:18 AM

## 2023-04-21 NOTE — Progress Notes (Signed)
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/29 Also with septic knee with gouty arthritis and so underwent R knee I&D on 12/30 Started empirically on IV vancomycin and Ceftriaxone Blood cultures NTD x 2 days Wound cultures pending He appears likely to need CIR - PT consulted   Acute kidney injury superimposed on chronic kidney disease  Likely prerenal azotemia. With a baseline creatinine of less than 1, on admission 1.9, now returned to baseline   Hypovolemic hyponatremia On admission 123, his ranges usually greater than 135 Improved with IV fluids, currently 131   Chronic systolic heart failure with a last EF of 30% in October 2025/ischemic cardiomyopathy Continue Coreg, Farxiga Need to hold Entresto, torsemide and Aldactone due to acute kidney injury Likely ok to resume medications soon   Acute metabolic encephalopathy Likely multifactorial in the setting of hypovolemia and infectious etiology Wife raises some concern for possible dementia Will add delirium precautions   Uncontrolled type 2 diabetes mellitus with hyperglycemia, with long-term current use of insulin  A1c was 6.9 in 10/2022 Continue NovoLog 70/30, 30 units twice a day, plus correction scale.   BPH Continue tamsulosin   Dyslipidemia Continue atorvastatin  PVD Continue ASA, Plavix   Essential  hypertension Continue Coreg Hold Entresto, torsemide and Aldactone in the setting of acute kidney injury Use hydralazine IV as needed  Chronic pain Continue mirtazepine, Lyrica, Percocet  I have reviewed this patient in the [place] Controlled Substances Reporting System.  He is receiving medications from only one provider and appears to be taking them as prescribed. He is not at particularly high risk of opioid misuse, diversion, or overdose.     Consultants: Podiatry Vascular surgery Orthopedics  Procedures: R AKA 12/29  Antibiotics: Ceftriaxone 12/28- Vancomycin 12/28-   30 Day Unplanned Readmission Risk Score    Flowsheet Row ED to Hosp-Admission (Current) from 04/18/2023 in [facility] (1A)  30 Day Unplanned Readmission Risk Score (%) 43.24 Filed at 04/21/2023 0400       This score is the patient's risk of an unplanned readmission within 30 days of being discharged (0 -100%). The score is based on dignosis, age, lab data, medications, orders, and past utilization.   Low:  0-14.9   Medium: 15-21.9   High: 22-29.9   Extreme: 30 and above           Subjective: Somnolent, post-I&D.  S/p R BKA.  Complaining of RLE pain.   Objective: Vitals:   04/21/23 1136 04/21/23 1538  BP: (!) 116/57 (!) 108/50  Pulse: 68 63  Resp: 19 18  Temp: 97.7 F (36.5 C) 97.9 F (36.6 C)  SpO2: 100% 100%    Intake/Output Summary (Last 24 hours) at 04/21/2023 1802 Last data filed at 04/21/2023 1023 Gross per 24 hour  Intake 570 ml  Output 1002 ml  Net -432 ml  Filed Weights   04/18/23 1755  Weight: 83.9 kg    Exam:  General:  Appears somnolent and fairly uncomfortable when awake Eyes:  EOMI, normal lids, iris ENT:  grossly normal hearing, lips & tongue, mmm Neck:  no LAD, masses or thyromegaly Cardiovascular:  RRR, no m/r/g. Respiratory:   CTA bilaterally with no wheezes/rales/rhonchi.  Normal respiratory effort. Abdomen:  soft, NT,  ND Skin:  no rash or induration seen on limited exam Musculoskeletal: s/p R BKA Psychiatric:  somnolent mood and affect, speech sparse but appropriate Neurologic:  CN 2-12 grossly intact, moves all extremities in coordinated fashion  Data Reviewed: I have reviewed the patient's lab results since admission.  Pertinent labs for today include:   Na++ 131 CO2 18 Glucose 182 BUN 36, improved     Family Communication: Wife was present throughout evaluation  Disposition: Status is: Inpatient Remains inpatient appropriate because: ongoing evaluation and management     Time spent: 50 minutes  Unresulted Labs (From admission, onward)     Start     Ordered   Unscheduled  CBC with Differential/Platelet  Tomorrow morning,   R       Question:  Specimen collection method  Answer:  Lab=Lab collect   04/21/23 1802   Unscheduled  Basic metabolic panel  Tomorrow morning,   R       Question:  Specimen collection method  Answer:  Lab=Lab collect   04/21/23 1802             Author: Jonah Blue, MD 04/21/2023 6:02 PM  For on call review [email or web address].

## 2023-04-21 NOTE — Progress Notes (Signed)
Orthopedics update after surgery  Performed right knee incision and drainage for septic knee.  Pus was found in the joint and it was thoroughly washed out.  Surgery went well.  Minimal blood loss.  No complications.  Culture was sent  Thank you   Nada Boozer, MD, [facility]

## 2023-04-21 NOTE — Anesthesia Postprocedure Evaluation (Signed)
Anesthesia Post Note  Patient: Manuel [name]  Procedure(s) Performed: IRRIGATION AND DEBRIDEMENT KNEE (Right: Knee)  Patient location during evaluation: PACU Anesthesia Type: General Level of consciousness: awake and alert Pain management: pain level controlled Vital Signs Assessment: post-procedure vital signs reviewed and stable Respiratory status: spontaneous breathing, nonlabored ventilation, respiratory function stable and patient connected to nasal cannula oxygen Cardiovascular status: blood pressure returned to baseline and stable Postop Assessment: no apparent nausea or vomiting Anesthetic complications: no   No notable events documented.   Last Vitals:  Vitals:   04/21/23 1115 04/21/23 1136  BP: 118/63 (!) 116/57  Pulse: 69 68  Resp: 20 19  Temp: 36.5 C 36.5 C  SpO2: 98% 100%    Last Pain:  Vitals:   04/21/23 1136  TempSrc: Oral  PainSc:                  [name]

## 2023-04-21 NOTE — Plan of Care (Signed)
  Problem: Clinical Measurements: Goal: Diagnostic test results will improve Outcome: Progressing   Problem: Respiratory: Goal: Ability to maintain adequate ventilation will improve Outcome: Progressing   Problem: Coping: Goal: Ability to adjust to condition or change in health will improve Outcome: Progressing   Problem: Metabolic: Goal: Ability to maintain appropriate glucose levels will improve Outcome: Progressing   Problem: Nutritional: Goal: Maintenance of adequate nutrition will improve Outcome: Progressing   Problem: Tissue Perfusion: Goal: Adequacy of tissue perfusion will improve Outcome: Progressing   Problem: Clinical Measurements: Goal: Will remain free from infection Outcome: Progressing Goal: Respiratory complications will improve Outcome: Progressing Goal: Cardiovascular complication will be avoided Outcome: Progressing   Problem: Nutrition: Goal: Adequate nutrition will be maintained Outcome: Progressing   Problem: Elimination: Goal: Will not experience complications related to urinary retention Outcome: Progressing   Problem: Pain Management: Goal: General experience of comfort will improve Outcome: Progressing   Problem: Safety: Goal: Ability to remain free from injury will improve Outcome: Progressing

## 2023-04-22 DIAGNOSIS — M00061 Staphylococcal arthritis, right knee: Secondary | ICD-10-CM

## 2023-04-22 DIAGNOSIS — Z89611 Acquired absence of right leg above knee: Secondary | ICD-10-CM | POA: Diagnosis not present

## 2023-04-22 DIAGNOSIS — M86261 Subacute osteomyelitis, right tibia and fibula: Secondary | ICD-10-CM | POA: Diagnosis not present

## 2023-04-22 LAB — BASIC METABOLIC PANEL
Anion gap: 7 (ref 5–15)
BUN: 35 mg/dL — ABNORMAL HIGH (ref 8–23)
CO2: 20 mmol/L — ABNORMAL LOW (ref 22–32)
Calcium: 8.1 mg/dL — ABNORMAL LOW (ref 8.9–10.3)
Chloride: 104 mmol/L (ref 98–111)
Creatinine, Ser: 0.98 mg/dL (ref 0.61–1.24)
GFR, Estimated: >60 mL/min (ref >60)
Glucose, Bld: 145 mg/dL — ABNORMAL HIGH (ref 70–99)
Potassium: 5.2 mmol/L — ABNORMAL HIGH (ref 3.5–5.1)
Sodium: 131 mmol/L — ABNORMAL LOW (ref 135–145)

## 2023-04-22 LAB — CBC WITH DIFFERENTIAL/PLATELET
Abs Immature Granulocytes: 0.12 10*3/uL — ABNORMAL HIGH (ref 0.00–0.07)
Basophils Absolute: 0 10*3/uL (ref 0.0–0.1)
Basophils Relative: 0 %
Eosinophils Absolute: 0.3 10*3/uL (ref 0.0–0.5)
Eosinophils Relative: 2 %
HCT: 24 % — ABNORMAL LOW (ref 39.0–52.0)
Hemoglobin: 8.2 g/dL — ABNORMAL LOW (ref 13.0–17.0)
Immature Granulocytes: 1 %
Lymphocytes Relative: 19 %
Lymphs Abs: 2.2 10*3/uL (ref 0.7–4.0)
MCH: 28.5 pg (ref 26.0–34.0)
MCHC: 34.2 g/dL (ref 30.0–36.0)
MCV: 83.3 fL (ref 80.0–100.0)
Monocytes Absolute: 1.2 10*3/uL — ABNORMAL HIGH (ref 0.1–1.0)
Monocytes Relative: 11 %
Neutro Abs: 7.5 10*3/uL (ref 1.7–7.7)
Neutrophils Relative %: 67 %
Platelets: 366 10*3/uL (ref 150–400)
RBC: 2.88 MIL/uL — ABNORMAL LOW (ref 4.22–5.81)
RDW: 16.7 % — ABNORMAL HIGH (ref 11.5–15.5)
WBC: 11.3 10*3/uL — ABNORMAL HIGH (ref 4.0–10.5)
nRBC: 0 % (ref 0.0–0.2)

## 2023-04-22 LAB — GLUCOSE, CAPILLARY
Glucose-Capillary: 143 mg/dL — ABNORMAL HIGH (ref 70–99)
Glucose-Capillary: 213 mg/dL — ABNORMAL HIGH (ref 70–99)
Glucose-Capillary: 149 mg/dL — ABNORMAL HIGH (ref 70–99)
Glucose-Capillary: 100 mg/dL — ABNORMAL HIGH (ref 70–99)

## 2023-04-22 MED ORDER — ORAL CARE MOUTH RINSE
15.0000 mL | OROMUCOSAL | Status: DC | PRN
Start: 2023-04-22 — End: 2023-05-08

## 2023-04-22 MED ORDER — VANCOMYCIN HCL 1500 MG/300ML IV SOLN
1500.0000 mg | INTRAVENOUS | Status: DC
  Administered 2023-04-23: 1500 mg via INTRAVENOUS
  Filled 2023-04-22: qty 300

## 2023-04-22 MED ORDER — SPIRONOLACTONE 25 MG PO TABS
50.0000 mg | ORAL_TABLET | Freq: Every day | ORAL | Status: DC
  Administered 2023-04-23 – 2023-05-01 (×9): 50 mg via ORAL
  Filled 2023-04-22 (×9): qty 2

## 2023-04-22 MED ORDER — SACUBITRIL-VALSARTAN 24-26 MG PO TABS
1.0000 | ORAL_TABLET | Freq: Two times a day (BID) | ORAL | Status: DC
Start: 2023-04-22 — End: 2023-05-08
  Administered 2023-04-22 – 2023-05-08 (×31): 1 via ORAL
  Filled 2023-04-22 (×34): qty 1

## 2023-04-22 NOTE — Plan of Care (Signed)
  Problem: Fluid Volume: Goal: Hemodynamic stability will improve Outcome: Progressing   Problem: Clinical Measurements: Goal: Diagnostic test results will improve Outcome: Progressing Goal: Signs and symptoms of infection will decrease Outcome: Progressing   Problem: Respiratory: Goal: Ability to maintain adequate ventilation will improve Outcome: Progressing   Problem: Coping: Goal: Ability to adjust to condition or change in health will improve Outcome: Progressing   Problem: Fluid Volume: Goal: Ability to maintain a balanced intake and output will improve Outcome: Progressing   Problem: Health Behavior/Discharge Planning: Goal: Ability to identify and utilize available resources and services will improve Outcome: Progressing Goal: Ability to manage health-related needs will improve Outcome: Progressing   Problem: Metabolic: Goal: Ability to maintain appropriate glucose levels will improve Outcome: Progressing   Problem: Nutritional: Goal: Maintenance of adequate nutrition will improve Outcome: Progressing

## 2023-04-22 NOTE — Progress Notes (Signed)
 Progress Note    04/22/2023 11:26 AM 1 Day Post-Op  Subjective: Manuel [name] is a 77 year old male now status post day #2 from a right below the knee amputation and postop day 1 from a right septic knee washout.  Patient is resting comfortably in bed this morning.  A lot less painful to his knee and his stump.  No other complaints overnight vitals are remained stable.   Vitals:   04/22/23 0422 04/22/23 0757  BP: 132/64 132/62  Pulse: 73 76  Resp: 17 17  Temp: 98.2 F (36.8 C) 98 F (36.7 C)  SpO2: 97% 99%   Physical Exam: Cardiac:  RRR, normal S1-S2.  Without rubs clicks or murmurs. Lungs: Clear on auscultation throughout, no wheezes rales or rhonchi noted.  Normal respiratory effort but somewhat diminished in the bases. Incisions: Right BKA incision covered with initial dressing.  No bleeding or drainage noted. Extremities: Right BKA with incision covered with dressing clean dry and intact.  Left lower extremity warm to touch with weak palpable pulses. Abdomen: Positive bowel sounds throughout, soft, nontender and nondistended. Neurologic: Alert and oriented x 3, cranial nerves II-XII grossly intact moving all extremities appropriately.  CBC    Component Value Date/Time   WBC 11.3 (H) 04/22/2023 0456   RBC 2.88 (L) 04/22/2023 0456   HGB 8.2 (L) 04/22/2023 0456   HGB 15.7 02/16/2012 2013   HCT 24.0 (L) 04/22/2023 0456   HCT 44.4 02/16/2012 2013   PLT 366 04/22/2023 0456   PLT 176 02/16/2012 2013   MCV 83.3 04/22/2023 0456   MCV 91 02/16/2012 2013   MCH 28.5 04/22/2023 0456   MCHC 34.2 04/22/2023 0456   RDW 16.7 (H) 04/22/2023 0456   RDW 13.0 02/16/2012 2013   LYMPHSABS 2.2 04/22/2023 0456   MONOABS 1.2 (H) 04/22/2023 0456   EOSABS 0.3 04/22/2023 0456   BASOSABS 0.0 04/22/2023 0456    BMET    Component Value Date/Time   NA 131 (L) 04/22/2023 0456   NA 137 02/16/2012 2013   K 5.2 (H) 04/22/2023 0456   K 3.8 02/16/2012 2013   CL 104 04/22/2023 0456   CL  102 02/16/2012 2013   CO2 20 (L) 04/22/2023 0456   CO2 25 02/16/2012 2013   GLUCOSE 145 (H) 04/22/2023 0456   GLUCOSE 154 (H) 02/16/2012 2013   BUN 35 (H) 04/22/2023 0456   BUN 10 02/16/2012 2013   CREATININE 0.98 04/22/2023 0456   CREATININE 0.72 02/16/2012 2013   CALCIUM  8.1 (L) 04/22/2023 0456   CALCIUM  9.6 02/16/2012 2013   GFRNONAA >60 04/22/2023 0456   GFRNONAA >60 02/16/2012 2013   GFRAA >60 12/09/2018 0929   GFRAA >60 02/16/2012 2013    INR    Component Value Date/Time   INR 1.4 (H) 04/19/2023 0442     Intake/Output Summary (Last 24 hours) at 04/22/2023 1126 Last data filed at 04/22/2023 1005 Gross per 24 hour  Intake 470 ml  Output 600 ml  Net -130 ml     Assessment/Plan:  77 y.o. male is s/p right below the knee amputation and right septic knee washout.  1 Day Post-Op   PLAN Vascular surgery plans on changing patient's first dressing change on Thursday, 04/24/2023. Continue antibiotics per ID and orthopedics after post septic knee washout. Dispo plan is rehab post right BKA for strengthening and conditioning. Patient to be dispositioned on ASA 81 mg daily, Lipitor  80 mg daily, Plavix  75 mg.   DVT prophylaxis: Lovenox  40 mg subcu daily.  [name] [facility] 04/22/2023 11:26 AM

## 2023-04-22 NOTE — Care Management Important Message (Signed)
 Important Message  Patient Details  Name: DAHIR AYER MRN: [id number] Date of Birth: [birth year]   Important Message Given:  Yes - Medicare IM     Cristela Blue, CMA 04/22/2023, 11:06 AM

## 2023-04-22 NOTE — Progress Notes (Signed)
 Pharmacy Antibiotic Note  Manuel [name] is a 77 y.o. male w/ PMH of OA, CAD, HTN, DDD, DM, gout, HLD, OSA, peripheral neuropathy admitted on 04/18/2023 with cellulitis.  Pharmacy has been consulted for vancomycin  dosing. Serum creatinine is elevated but improving since admission  Plan:  Adjust vancomycin  dose to 1500 mg IV every 24 hours based on current renal function (Scr 0.98) eAUC 464.3, Cmin 10.3, Scr 0.98, IBW used, Vd 0.72 L/kg Pharmacy will continue to follow and will adjust abx dosing as warranted. F/u renal fxn, cultures, length of tx, etc  Temp (24hrs), Avg:97.9 F (36.6 C), Min:96.8 F (36 C), Max:98.4 F (36.9 C)   Recent Labs  Lab 04/15/23 1432 04/18/23 1758 04/18/23 1759 04/19/23 0442 04/19/23 1745 04/20/23 0424 04/21/23 0431 04/21/23 1324 04/22/23 0456  WBC 11.6*  --  16.5* 15.6*  --   --   --  13.9* 11.3*  CREATININE 1.51*  --  1.98* 1.71* 1.52* 1.19 0.84 0.89 0.98  LATICACIDVEN 0.8 1.1  --   --   --   --   --   --   --   VANCORANDOM  --   --   --   --   --  11  --   --   --     Estimated Creatinine Clearance: 64.5 mL/min (by C-G formula based on SCr of 0.98 mg/dL).    No Known Allergies  Antimicrobials this admission: 12/28 cefepime  >> x 1 dose 12/28 vancomycin  >>  12/28 ceftriaxone  >>   Microbiology results: 12/28 BCx: NGTD 12/28 WCx: rare GPC MRSA PCR negative  Thank you for involving pharmacy in this patient's care.   Damien Napoleon, PharmD Clinical Pharmacist 04/22/2023 9:50 AM

## 2023-04-22 NOTE — Progress Notes (Signed)
 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  He then underwent R knee I&D for septic/gouty arthritis on 12/30.  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/29 Also with septic knee with gouty arthritis and so underwent R knee I&D on 12/30 Wound cultures with MSSA thus far Started empirically on IV vancomycin  and Ceftriaxone , may be able to narrow coverage soon Blood cultures NTD x 3 days He appears likely to need CIR - PT consulted   Acute kidney injury  Likely prerenal azotemia With a baseline creatinine of less than 1, on admission 1.9, now returned to baseline   Hypovolemic hyponatremia On admission 123, his ranges usually greater than 135 Improved with IV fluids, currently 131   Chronic systolic heart failure with a last EF of 30% in October 2025/ischemic cardiomyopathy Continue Coreg , Farxiga  Held Entresto , torsemide  and Aldactone  due to acute kidney injury Resume Entresto  and Aldactone  for now Consider restarting torsemide  tomorrow if renal function remains stable   Acute metabolic encephalopathy Likely multifactorial in the setting of hypovolemia and infectious etiology Wife raises some concern for possible dementia Will add delirium precautions   Uncontrolled type 2 diabetes mellitus with hyperglycemia, with long-term current use of insulin   A1c was 6.9 in 10/2022 Continue NovoLog  70/30, 30 units twice a day, plus  correction scale.   BPH Continue tamsulosin    Dyslipidemia Continue atorvastatin    PVD Continue ASA, Plavix    Essential hypertension Continue Coreg  Hold Entresto , torsemide  and Aldactone  in the setting of acute kidney injury Use hydralazine  IV as needed   Chronic pain Continue mirtazepine, Lyrica , Percocet  I have reviewed this patient in the [place] Controlled Substances Reporting System.  He is receiving medications from only one provider and appears to be taking them as prescribed. He is not at particularly high risk of opioid misuse, diversion, or overdose.        Consultants: Podiatry Vascular surgery Orthopedics   Procedures: R AKA 12/29   Antibiotics: Ceftriaxone  12/28- Vancomycin  12/28-   30 Day Unplanned Readmission Risk Score    Flowsheet Row ED to Hosp-Admission (Current) from 04/18/2023 in [facility] (1A)  30 Day Unplanned Readmission Risk Score (%) 44.29 Filed at 04/22/2023 0401       This score is the patient's risk of an unplanned readmission within 30 days of being discharged (0 -100%). The score is based on dignosis, age, lab data, medications, orders, and past utilization.   Low:  0-14.9   Medium: 15-21.9   High: 22-29.9   Extreme: 30 and above           Subjective: Somnolent, complains of pain when awakened.   Objective: Vitals:   04/22/23 0757 04/22/23 1152  BP: 132/62 117/84  Pulse: 76 70  Resp: 17 20  Temp: 98 F (36.7 C) 98.1 F (36.7 C)  SpO2: 99% 98%    Intake/Output Summary (Last 24 hours) at 04/22/2023  1300 Last data filed at 04/22/2023 1005 Gross per 24 hour  Intake 470 ml  Output 600 ml  Net -130 ml   Filed Weights   04/18/23 1755  Weight: 83.9 kg    Exam:  General:  Appears somnolent and fairly uncomfortable when awake Eyes:  EOMI, normal lids, iris ENT:  grossly normal hearing, lips & tongue, mmm Neck:  no LAD, masses or thyromegaly Cardiovascular:  RRR, no  m/r/g. Respiratory:   CTA bilaterally with no wheezes/rales/rhonchi.  Normal respiratory effort. Abdomen:  soft, NT, ND Skin:  no rash or induration seen on limited exam Musculoskeletal: s/p R BKA Psychiatric:  somnolent mood and affect, speech sparse but appropriate Neurologic:  CN 2-12 grossly intact, moves all extremities in coordinated fashion  Data Reviewed: I have reviewed the patient's lab results since admission.  Pertinent labs for today include:   Na++ 131 K+ 5.2 Glucose 145 WBC 11.3 Hgb 8.2 - stable Wound culture pending    Family Communication: None present today  Disposition: Status is: Inpatient Remains inpatient appropriate because: ongoing management     Time spent: 50 minutes  Unresulted Labs (From admission, onward)     Start     Ordered   04/23/23 0500  CBC with Differential/Platelet  Tomorrow morning,   R       Question:  Specimen collection method  Answer:  Lab=Lab collect   04/22/23 1300   04/23/23 0500  Basic metabolic panel  Tomorrow morning,   R       Question:  Specimen collection method  Answer:  Lab=Lab collect   04/22/23 1300             Author: Delon Herald, MD 04/22/2023 1:00 PM  For on call review [email or web address].

## 2023-04-22 NOTE — Plan of Care (Signed)
 Patient complained of pain to right surgical site throughout shift. Medicated with short term relief. No acute distress noted. Will continue to monitor.   Problem: Fluid Volume: Goal: Hemodynamic stability will improve Outcome: Progressing   Problem: Clinical Measurements: Goal: Diagnostic test results will improve Outcome: Progressing Goal: Signs and symptoms of infection will decrease Outcome: Progressing   Problem: Respiratory: Goal: Ability to maintain adequate ventilation will improve Outcome: Progressing   Problem: Education: Goal: Ability to describe self-care measures that may prevent or decrease complications (Diabetes Survival Skills Education) will improve Outcome: Progressing Goal: Individualized Educational Video(s) Outcome: Progressing   Problem: Coping: Goal: Ability to adjust to condition or change in health will improve Outcome: Progressing   Problem: Fluid Volume: Goal: Ability to maintain a balanced intake and output will improve Outcome: Progressing   Problem: Health Behavior/Discharge Planning: Goal: Ability to identify and utilize available resources and services will improve Outcome: Progressing Goal: Ability to manage health-related needs will improve Outcome: Progressing   Problem: Metabolic: Goal: Ability to maintain appropriate glucose levels will improve Outcome: Progressing   Problem: Nutritional: Goal: Maintenance of adequate nutrition will improve Outcome: Progressing Goal: Progress toward achieving an optimal weight will improve Outcome: Progressing   Problem: Skin Integrity: Goal: Risk for impaired skin integrity will decrease Outcome: Progressing   Problem: Tissue Perfusion: Goal: Adequacy of tissue perfusion will improve Outcome: Progressing   Problem: Education: Goal: Knowledge of General Education information will improve Description: Including pain rating scale, medication(s)/side effects and non-pharmacologic comfort  measures Outcome: Progressing   Problem: Health Behavior/Discharge Planning: Goal: Ability to manage health-related needs will improve Outcome: Progressing   Problem: Clinical Measurements: Goal: Ability to maintain clinical measurements within normal limits will improve Outcome: Progressing Goal: Will remain free from infection Outcome: Progressing Goal: Diagnostic test results will improve Outcome: Progressing Goal: Respiratory complications will improve Outcome: Progressing Goal: Cardiovascular complication will be avoided Outcome: Progressing   Problem: Activity: Goal: Risk for activity intolerance will decrease Outcome: Progressing   Problem: Nutrition: Goal: Adequate nutrition will be maintained Outcome: Progressing   Problem: Coping: Goal: Level of anxiety will decrease Outcome: Progressing   Problem: Elimination: Goal: Will not experience complications related to bowel motility Outcome: Progressing Goal: Will not experience complications related to urinary retention Outcome: Progressing   Problem: Pain Management: Goal: General experience of comfort will improve Outcome: Progressing   Problem: Safety: Goal: Ability to remain free from injury will improve Outcome: Progressing   Problem: Skin Integrity: Goal: Risk for impaired skin integrity will decrease Outcome: Progressing

## 2023-04-22 NOTE — Consult Note (Signed)
 NAME: Manuel [name]  DOB: 11-Mar-1946  MRN: [id number]  Date/Time: 04/22/2023 2:35 PM  REQUESTING PROVIDER Subjective:  REASON FOR CONSULT:  ? Manuel [name] is a 77 y.o. 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, followed by TMA in oct 2024 presented with infected TMA site Pt has complicated PAD history and foot infection   he had endarterectomies of both rt and left common, profunda and superficial femoral artery, fogary embolectomy of rt SFA, stent placement in b/l common iliac arteries, left external iliac and Rt PTA, on 11/15/22  Had wound dehiscence on the rt groin  , he underwent debridement of the wound on the rt groin on 01/09/23 with excision of 15 cm2 of skin, soft tissue, muscle fascia and application of neg pressure vac He had serratia , staph aureus and ecoli in culture and received Iv antibiotic followed by PO cipro + Augmentin  He also underwent TMA on 02/17/23 He followed with Podiatrist as OP On 12/24 he came to the ED with rt knee and leg pain after he fell out of bed. The TMA site was red , weeping and and he was started on keflex  and bactrim  and discharged from ED. Xray was done in the ED on 12/14 and it did not show any fracture.  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 As he continued to have rt knee pain underwent aspiration and cell count was 124,737 HE had wash out of the rt knee on 04/21/23 for septic knee I am asked to see the patient for  the same  Pt has taken pain meds and is somnolent Main complaint is rt knee pain   Past Medical History:  Diagnosis Date   Acute ST elevation myocardial infarction (STEMI) of inferior wall (HCC) 04/19/2010   a.) transfered from [facility] to [facility] --> LHC/PCI (very difficult procedure) --> 3.0 x 23 mm and 3.0 x 12 mm Xience stents to RCA   Allergies    Arthritis    Benign essential hypertension    Bilateral carotid artery disease (HCC) 05/08/2021   a.) carotid doppler 05/08/2021: 1-39% BICA   CAD (coronary artery disease) 04/19/2010   a.) inferior STEMI 04/19/2010 --> LHC/PCI: 50-70% pD1, 80% pRI, 90/90/90% RCA (overlapping 3.0 x 23 and 3.0 x 12 mm Xience DES); b.) MV 11/10/2018: fixed minimally reversible inferior perfusion defect   Cellulitis of foot    DDD (degenerative disc disease), cervical    Diabetes mellitus type 2, insulin  dependent (HCC)    Diverticulosis    Full dentures    Gout    Hard of hearing    History of bilateral cataract extraction 2022   History of ETOH abuse    Hyperlipidemia    Ischemic cardiomyopathy 04/19/2010   a.) TTE 04/19/2010: 40%; b.) TTE 04/20/2014: EF >55%, mild RVE, triv PR, mild MR/TR,  G1DD; c.) TTE 11/10/2018: EF 45%, inf HK, mild RVE, triv TR/PR, mild MR, G1DD; d.) TTE 11/14/2022: EF 25-30%, basal anteroseptal, apical lateral, apical septal, and apex AK, mid anterolateral HK, mild-mod MR/TR   Long term current use of aspirin     Long term current use of clopidogrel     Lumbar degenerative disc disease    Lumbar radiculopathy    Lumbar vertebral fracture (chronic superior endplate of L1)    OSA (obstructive sleep apnea)    a.) unable to tolerate nocturnal PAP therapy   Peripheral artery disease (HCC)    a.) stenting 05/14/21: 12 mm x 12 cm LifeStent RIGHT dis SFA/prox pop; b.) s/p cath directed thrombolysis RIGHT SFA/pop 06/14/21; c.) s/p mech thrombectomy + stenting 06/15/21: 8 mm x 25cm & 8 mm x 7.5cm Viabahn; d.) s/p BILAT CFA, profunda femoris, SFA  endarterectomies + fogarty embolectomy + stenting 11/15/22: 12mm x 58mm Lifestream BILAT CIAs, 14 mm x 6 cm Lifestream & 13 mm x 5 cm Viabahn LEFT EIA   Peripheral neuropathy    Umbilical hernia     Past Surgical History:  Procedure Laterality Date   AMPUTATION Right 11/18/2022   Procedure: AMPUTATION 4TH AND 5TH RAY;  Surgeon: [name], [name];  Location: [facility];  Service: Orthopedics/Podiatry;  Laterality: Right;  4th and 5th toe   AMPUTATION Right 04/20/2023   Procedure: AMPUTATION BELOW KNEE;  Surgeon: [name], MD;  Location: [facility];  Service: Vascular;  Laterality: Right;  block then general   APPLICATION OF WOUND VAC Right 01/09/2023   Procedure: APPLICATION OF WOUND VAC;  Surgeon: [name], MD;  Location: [facility];  Service: Vascular;  Laterality: Right;   CATARACT EXTRACTION W/PHACO Right 03/14/2021   Procedure: CATARACT EXTRACTION PHACO AND INTRAOCULAR LENS PLACEMENT (IOC) RIGHT DIABETIC;  Surgeon: [name], MD;  Location: [facility];  Service: Ophthalmology;  Laterality: Right;  Diabetic 16.78 01:39.9   CATARACT EXTRACTION W/PHACO Left 03/28/2021   Procedure: CATARACT EXTRACTION PHACO AND INTRAOCULAR LENS PLACEMENT (IOC) LEFT DIABETIC 6.93 01:22.0;  Surgeon: [name], MD;  Location: [facility];  Service: Ophthalmology;  Laterality: Left;  Diabetic   COLONOSCOPY     CORONARY ANGIOPLASTY WITH STENT PLACEMENT  03/2010   Procedure: CORONARY ANGIOPLASTY WITH STENT PLACEMENT; Location: [name]   ENDARTERECTOMY FEMORAL Bilateral 11/15/2022   Procedure: BILATERAL COMMON FEMORAL PROFUNDA FEMORIS AND SUPERFICIAL FEMORAL ARTERY ENDARTECTOMIES, RIGHT FOGARTY EMBOLECTOMY OF THE RIGHT SFA  AND  POPLITEAL ARTERIES. AORTAGRAM AND RIGHT LOWER EXTREMITY ANGIOGRAM.;  Surgeon: [name], MD;  Location: [facility];  Service: Vascular;  Laterality: Bilateral;   INSERTION OF ILIAC STENT Bilateral 11/15/2022   Procedure: BILATERAL STENT INSERTION IN  BILATERAL  COMMON ILIAC ARTERY, STENT INSERTION OF LEFT EXTERNAL ILIAC ARTERY. ANGIOPLASTY RIGHT TIBIAL  AND POPLITEAL ARTERY.;  Surgeon: [name], MD;  Location: [facility];  Service: Vascular;  Laterality: Bilateral;   IRRIGATION AND DEBRIDEMENT KNEE Right 04/21/2023   Procedure: IRRIGATION AND DEBRIDEMENT KNEE;  Surgeon: Zafonte, Brian Tyrie, MD;  Location: [facility];  Service: Orthopedics;  Laterality: Right;   LOWER EXTREMITY ANGIOGRAPHY Right 05/14/2021   Procedure: LOWER EXTREMITY ANGIOGRAPHY;  Surgeon: [name], MD;  Location: [facility];  Service: Cardiovascular;  Laterality: Right;   LOWER EXTREMITY ANGIOGRAPHY Right 06/14/2021   Procedure: Lower Extremity Angiography;  Surgeon: [name], MD;  Location: [facility];  Service: Cardiovascular;  Laterality: Right;   LOWER EXTREMITY ANGIOGRAPHY Right 11/11/2022   Procedure:  Lower Extremity Angiography;  Surgeon: [name], MD;  Location: [facility];  Service: Cardiovascular;  Laterality: Right;   LOWER EXTREMITY INTERVENTION Right 06/15/2021   Procedure: LOWER EXTREMITY INTERVENTION;  Surgeon: [name], MD;  Location: [facility];  Service: Cardiovascular;  Laterality: Right;   TONSILLECTOMY     TRANSMETATARSAL AMPUTATION Right 02/17/2023   Procedure: TRANSMETATARSAL AMPUTATION;  Surgeon: Lennie Barter, [name];  Location: [facility];  Service: Orthopedics/Podiatry;  Laterality: Right;   WOUND DEBRIDEMENT Right 01/09/2023   Procedure: DEBRIDEMENT WOUND;  Surgeon: [name], MD;  Location: [facility];  Service: Vascular;  Laterality: Right;    Social History   Socioeconomic History   Marital status: Married    Spouse name: [name]   Number of children: 3   Years of education: Not on file   Highest education level: Not on file  Occupational History   Not on file  Tobacco Use   Smoking status: Never   Smokeless tobacco: Never  Vaping Use   Vaping status: Never Used  Substance and  Sexual Activity   Alcohol  use: Yes    Alcohol /week: 21.0 standard drinks of alcohol     Types: 7 Glasses of wine, 14 Cans of beer per week    Comment: occassional   Drug use: No   Sexual activity: Not on file  Other Topics Concern   Not on file  Social History Narrative   Not on file   Social Drivers of Health   Financial Resource Strain: Patient Unable To Answer (02/20/2023)   Received from [facility]   Overall Financial Resource Strain (CARDIA)    Difficulty of Paying Living Expenses: Patient unable to answer  Food Insecurity: No Food Insecurity (04/19/2023)   Hunger Vital Sign    Worried About Running Out of Food in the Last Year: Never true    Ran Out of Food in the Last Year: Never true  Transportation Needs: No Transportation Needs (04/19/2023)   PRAPARE - Administrator, Civil Service (Medical): No    Lack of Transportation (Non-Medical): No  Physical Activity: Not on file  Stress: Not on file  Social Connections: Unknown (04/22/2023)   Social Connection and Isolation Panel [NHANES]    Frequency of Communication with Friends and Family: More than three times a week    Frequency of Social Gatherings with Friends and Family: More than three times a week    Attends Religious Services: Patient declined    Database Administrator or Organizations: No    Attends Banker Meetings: Patient declined    Marital Status: Married  Catering Manager Violence: Not At Risk (04/19/2023)   Humiliation, Afraid, Rape, and Kick questionnaire    Fear of Current or Ex-Partner: No    Emotionally Abused: No    Physically Abused: No    Sexually Abused: No    Family History  Problem Relation Age of Onset   Scoliosis Mother    Heart disease Father    No Known Allergies I? Current Facility-Administered Medications  Medication Dose Route Frequency Provider Last Rate Last Admin   acetaminophen  (TYLENOL ) tablet 650 mg  650 mg Oral Q6H PRN Zafonte,  Redell Ned, MD       Or   acetaminophen  (TYLENOL ) suppository 650 mg  650 mg Rectal Q6H PRN Zafonte, Redell Ned, MD       aspirin  EC tablet 81 mg  81 mg Oral Daily Zafonte, Redell Ned, MD  81 mg at 04/22/23 1042   atorvastatin  (LIPITOR ) tablet 80 mg  80 mg Oral QHS Zafonte, Brian Niall, MD   80 mg at 04/21/23 2210   carvedilol  (COREG ) tablet 25 mg  25 mg Oral BID WC Zafonte, Brian Fernando, MD   25 mg at 04/22/23 9162   cefTRIAXone  (ROCEPHIN ) 2 g in sodium chloride  0.9 % 100 mL IVPB  2 g Intravenous Q24H Zafonte, Brian Tajee, MD 200 mL/hr at 04/22/23 1414 2 g at 04/22/23 1414   clopidogrel  (PLAVIX ) tablet 75 mg  75 mg Oral Daily Zafonte, Brian Ronal, MD   75 mg at 04/22/23 1041   colchicine  tablet 0.6 mg  0.6 mg Oral BID Zafonte, Brian Pavlos, MD   0.6 mg at 04/22/23 1043   dapagliflozin  propanediol (FARXIGA ) tablet 10 mg  10 mg Oral Daily Zafonte, Brian Jasraj, MD   10 mg at 04/22/23 1047   enoxaparin  (LOVENOX ) injection 40 mg  40 mg Subcutaneous Q24H Zafonte, Brian Cuong, MD   40 mg at 04/22/23 1045   folic acid  (FOLVITE ) tablet 1 mg  1 mg Oral Daily Zafonte, Brian Quang, MD   1 mg at 04/22/23 1041   hydrALAZINE  (APRESOLINE ) tablet 25 mg  25 mg Oral Q6H PRN Zafonte, Brian Baden, MD       insulin  aspart (novoLOG ) injection 0-20 Units  0-20 Units Subcutaneous TID WC Zafonte, Redell Ned, MD   7 Units at 04/22/23 1159   insulin  aspart (novoLOG ) injection 0-5 Units  0-5 Units Subcutaneous QHS Zafonte, Brian Evart, MD   2 Units at 04/20/23 2215   insulin  aspart protamine - aspart (NOVOLOG  MIX 70/30) injection 30 Units  30 Units Subcutaneous BID WC Zafonte, Brian Antonyo, MD   30 Units at 04/22/23 1040   iron  polysaccharides (NIFEREX) capsule 150 mg  150 mg Oral Daily Zafonte, Brian Elhadji, MD   150 mg at 04/22/23 1047   menthol -cetylpyridinium (CEPACOL) lozenge 3 mg  1 lozenge Oral PRN Barbarann Nest, MD       mirtazapine  (REMERON ) tablet 15 mg  15 mg Oral QHS Zafonte, Brian Antwon, MD    15 mg at 04/21/23 2210   morphine  (PF) 4 MG/ML injection 4 mg  4 mg Intravenous Q4H PRN Zafonte, Brian Leavy, MD   4 mg at 04/22/23 9780   multivitamin with minerals tablet 1 tablet  1 tablet Oral Daily Zafonte, Brian Brenyn, MD   1 tablet at 04/22/23 1042   mupirocin  ointment (BACTROBAN ) 2 % 1 Application  1 Application Nasal BID Zafonte, Brian Bary, MD   1 Application at 04/22/23 1044   ondansetron  (ZOFRAN ) tablet 4 mg  4 mg Oral Q6H PRN Zafonte, Brian Wadie, MD       Or   ondansetron  (ZOFRAN ) injection 4 mg  4 mg Intravenous Q6H PRN Zafonte, Redell Ned, MD       Oral care mouth rinse  15 mL Mouth Rinse PRN Barbarann Nest, MD       oxyCODONE -acetaminophen  (PERCOCET/ROXICET) 5-325 MG per tablet 1 tablet  1 tablet Oral Q6H PRN Zafonte, Brian Priest, MD   1 tablet at 04/22/23 0845   pregabalin  (LYRICA ) capsule 75 mg  75 mg Oral BID Zafonte, Brian Norah, MD   75 mg at 04/22/23 1041   protein supplement (ENSURE MAX) liquid  11 oz Oral Daily Zafonte, Brian Yosmar, MD   11 oz at 04/22/23 1043   sacubitril -valsartan  (ENTRESTO ) 24-26 mg per tablet  1 tablet Oral Q12H Barbarann Nest, MD   1 tablet at  04/22/23 1409   senna-docusate (Senokot-S) tablet 1 tablet  1 tablet Oral QHS PRN Zafonte, Redell Ned, MD       simethicone  (MYLICON) chewable tablet 80 mg  80 mg Oral QID PRN Barbarann Nest, MD   80 mg at 04/22/23 1311   [START ON 04/23/2023] spironolactone  (ALDACTONE ) tablet 50 mg  50 mg Oral Daily Barbarann Nest, MD       tamsulosin  (FLOMAX ) capsule 0.4 mg  0.4 mg Oral QPC supper Zafonte, Brian Ismail, MD   0.4 mg at 04/21/23 1736   thiamine  (VITAMIN B1) tablet 100 mg  100 mg Oral Daily Zafonte, Brian Britain, MD   100 mg at 04/22/23 1042   traZODone  (DESYREL ) tablet 25 mg  25 mg Oral QHS PRN Zafonte, Brian Adalid, MD       [START ON 04/23/2023] vancomycin  (VANCOREADY) IVPB 1500 mg/300 mL  1,500 mg Intravenous Q24H Elesa Perkins, RPH         Abtx:  Anti-infectives (From admission, onward)     Start     Dose/Rate Route Frequency Ordered Stop   04/23/23 0830  vancomycin  (VANCOREADY) IVPB 1500 mg/300 mL        1,500 mg 150 mL/hr over 120 Minutes Intravenous Every 24 hours 04/22/23 0957 04/26/23 0829   04/20/23 0830  vancomycin  (VANCOREADY) IVPB 1250 mg/250 mL  Status:  Discontinued        1,250 mg 166.7 mL/hr over 90 Minutes Intravenous Every 24 hours 04/20/23 0750 04/22/23 0957   04/20/23 0800  vancomycin  (VANCOCIN ) IVPB 1000 mg/200 mL premix  Status:  Discontinued        1,000 mg 200 mL/hr over 60 Minutes Intravenous Every 24 hours 04/19/23 1440 04/20/23 0750   04/19/23 1400  cefTRIAXone  (ROCEPHIN ) 2 g in sodium chloride  0.9 % 100 mL IVPB        2 g 200 mL/hr over 30 Minutes Intravenous Every 24 hours 04/19/23 0228 04/26/23 1359   04/19/23 0800  vancomycin  (VANCOREADY) IVPB 750 mg/150 mL  Status:  Discontinued        750 mg 150 mL/hr over 60 Minutes Intravenous Every 24 hours 04/19/23 0237 04/19/23 1440   04/19/23 0230  vancomycin  (VANCOCIN ) IVPB 1000 mg/200 mL premix  Status:  Discontinued        1,000 mg 200 mL/hr over 60 Minutes Intravenous  Once 04/19/23 0228 04/19/23 0230   04/19/23 0130  ceFEPIme  (MAXIPIME ) 2 g in sodium chloride  0.9 % 100 mL IVPB        2 g 200 mL/hr over 30 Minutes Intravenous  Once 04/19/23 0121 04/19/23 0240   04/19/23 0130  Vancomycin  (VANCOCIN ) 1,500 mg in sodium chloride  0.9 % 500 mL IVPB        1,500 mg 250 mL/hr over 120 Minutes Intravenous  Once 04/19/23 0121 04/19/23 0438       REVIEW OF SYSTEMS:  Limited as he is somnolent Says he has intermittent shart rt knee pain Some times has sharp left upper quadrant lower chect pain which he says is due to gas Objective:  VITALS:  BP 117/84 (BP Location: Left Arm)   Pulse 70   Temp 98.1 F (36.7 C) (Oral)   Resp 20   Ht 5' 8 (1.727 m)   Wt 83.9 kg   SpO2 98%   BMI 28.12 kg/m   PHYSICAL EXAM:  General: somnolent Responds to questions Head: Normocephalic, without obvious  abnormality, atraumatic. Eyes: Conjunctivae clear, anicteric sclerae. Pupils are equal ENT did not examine Neck:symmetrical, no  adenopathy, thyroid: non tender no carotid bruit and no JVD. Lungs: b/l air entry Heart: s1s2 Abdomen: Soft, non-tender,not distended. Bowel sounds normal. No masses Extremities: Rt BKA- surgical dressing Skin: No rashes or lesions. Or bruising Lymph: Cervical, supraclavicular normal. Neurologic: Grossly non-focal Pertinent Labs Lab Results CBC    Component Value Date/Time   WBC 11.3 (H) 04/22/2023 0456   RBC 2.88 (L) 04/22/2023 0456   HGB 8.2 (L) 04/22/2023 0456   HGB 15.7 02/16/2012 2013   HCT 24.0 (L) 04/22/2023 0456   HCT 44.4 02/16/2012 2013   PLT 366 04/22/2023 0456   PLT 176 02/16/2012 2013   MCV 83.3 04/22/2023 0456   MCV 91 02/16/2012 2013   MCH 28.5 04/22/2023 0456   MCHC 34.2 04/22/2023 0456   RDW 16.7 (H) 04/22/2023 0456   RDW 13.0 02/16/2012 2013   LYMPHSABS 2.2 04/22/2023 0456   MONOABS 1.2 (H) 04/22/2023 0456   EOSABS 0.3 04/22/2023 0456   BASOSABS 0.0 04/22/2023 0456       Latest Ref Rng & Units 04/22/2023    4:56 AM 04/21/2023    1:24 PM 04/21/2023    4:31 AM  CMP  Glucose 70 - 99 mg/dL 854  817    BUN 8 - 23 mg/dL 35  36    Creatinine 9.38 - 1.24 mg/dL 9.01  9.10  9.15   Sodium 135 - 145 mmol/L 131  131    Potassium 3.5 - 5.1 mmol/L 5.2  5.0    Chloride 98 - 111 mmol/L 104  104    CO2 22 - 32 mmol/L 20  18    Calcium  8.9 - 10.3 mg/dL 8.1  8.0        Microbiology: Recent Results (from the past 240 hours)  Blood culture (routine x 2)     Status: None (Preliminary result)   Collection Time: 04/19/23  1:50 AM   Specimen: BLOOD  Result Value Ref Range Status   Specimen Description BLOOD BLOOD LEFT ARM  Final   Special Requests   Final    BOTTLES DRAWN AEROBIC AND ANAEROBIC Blood Culture results may not be optimal due to an inadequate volume of blood received in culture bottles   Culture   Final    NO GROWTH 3  DAYS Performed at [facility], [address] [place], [place] [address]    Report Status PENDING  Incomplete  Blood culture (routine x 2)     Status: None (Preliminary result)   Collection Time: 04/19/23  1:56 AM   Specimen: BLOOD  Result Value Ref Range Status   Specimen Description BLOOD BLOOD RIGHT ARM  Final   Special Requests   Final    BOTTLES DRAWN AEROBIC AND ANAEROBIC Blood Culture adequate volume   Culture   Final    NO GROWTH 3 DAYS Performed at [facility], [address] [place], [place] [address]    Report Status PENDING  Incomplete  Aerobic/Anaerobic Culture w Gram Stain (surgical/deep wound)     Status: None (Preliminary result)   Collection Time: 04/19/23  7:56 AM   Specimen: Foot  Result Value Ref Range Status   Specimen Description   Final    FOOT Performed at [facility], [address] [place], [place] [address]    Special Requests   Final    NONE Performed at [facility], [address] [place], [place] [address]    Gram Stain   Final    NO WBC SEEN  RARE GRAM POSITIVE COCCI Performed at [facility], [address], [place], [place] [address]    Culture   Final    MODERATE STAPHYLOCOCCUS AUREUS FEW DIPHTHEROIDS(CORYNEBACTERIUM SPECIES) Standardized susceptibility testing for this organism is not available. NO ANAEROBES ISOLATED; CULTURE IN PROGRESS FOR 5 DAYS    Report Status PENDING  Incomplete   Organism ID, Bacteria STAPHYLOCOCCUS AUREUS  Final      Susceptibility   Staphylococcus aureus - MIC*    CIPROFLOXACIN  >=8 RESISTANT Resistant     ERYTHROMYCIN >=8 RESISTANT Resistant     GENTAMICIN  <=0.5 SENSITIVE Sensitive     OXACILLIN <=0.25 SENSITIVE Sensitive     TETRACYCLINE >=16 RESISTANT Resistant     VANCOMYCIN  1 SENSITIVE Sensitive     TRIMETH /SULFA  <=10 SENSITIVE Sensitive     CLINDAMYCIN >=8 RESISTANT Resistant     RIFAMPIN <=0.5 SENSITIVE Sensitive     Inducible Clindamycin NEGATIVE  Sensitive     LINEZOLID  1 SENSITIVE Sensitive     * MODERATE STAPHYLOCOCCUS AUREUS  Surgical PCR screen     Status: None   Collection Time: 04/20/23  1:07 AM   Specimen: Nasal Mucosa; Nasal Swab  Result Value Ref Range Status   MRSA, PCR NEGATIVE NEGATIVE Final   Staphylococcus aureus NEGATIVE NEGATIVE Final    Comment: (NOTE) The Xpert SA Assay (FDA approved for NASAL specimens in patients 3 years of age and older), is one component of a comprehensive surveillance program. It is not intended to diagnose infection nor to guide or monitor treatment. Performed at [facility], [address] [place], [place] [address]   Body fluid culture w Gram Stain     Status: None (Preliminary result)   Collection Time: 04/20/23  3:00 PM   Specimen: Synovium; Body Fluid  Result Value Ref Range Status   Specimen Description   Final    SYNOVIAL Performed at [facility], [address] [place], [place] [address]    Special Requests   Final    RIGHT KNEE Performed at [facility], [address] [place], [place] [address]    Gram Stain   Final    ABUNDANT WBC PRESENT, PREDOMINANTLY PMN MODERATE GRAM POSITIVE COCCI CRITICAL RESULT CALLED TO, READ BACK BY AND VERIFIED WITH: RN SANTANA [facility] [id number] AT 1955 BY EC    Culture   Final    NO GROWTH 2 DAYS Performed at [facility], [address], [place], [place] [address]    Report Status PENDING  Incomplete  Aerobic/Anaerobic Culture w Gram Stain (surgical/deep wound)     Status: None (Preliminary result)   Collection Time: 04/21/23  9:49 AM   Specimen: Path fluid; Body Fluid  Result Value Ref Range Status   Specimen Description   Final    FLUID Performed at [facility], [address] [place], [place] [address]    Special Requests SWAB OF RIGHT KNEE SYNOVIAL FLUID  Final   Gram Stain   Final    FEW WBC PRESENT, PREDOMINANTLY PMN NO ORGANISMS SEEN    Culture   Final    NO GROWTH < 24  HOURS Performed at [facility], 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 Impression/Recommendation ?Rt TMA site infection- underwent BKA Wound culture was MSSA  Rt knee septic arthritis- s/p I/D  culture pending  On ceftriaxone  and vanco- depending on culture result can deescalate  DM- management as per primary team  PAD-- h/o endarterectomies of both rt and left common, profunda and superficial femoral artery, fogary embolectomy of rt SFA, stent placement in b/l common iliac arteries, left external iliac and Rt PTA, on 11/15/22   AKI- resolved  Anemia  ? Discussed the management with the patient and his nurse  ________________________________________________

## 2023-04-22 NOTE — TOC Progression Note (Signed)
 Transition of Care [facility]) - Progression Note    Patient Details  Name: Manuel [name] MRN: [id number] Date of Birth: [birth year]  Transition of Care [facility]) CM/SW Contact  Latiesha Harada A Densel Kronick, RN Phone Number: 04/22/2023, 11:12 AM  Clinical Narrative:    Chart reviewed.  Patient was admitted with Osteomyelitis.  Noted that patient has recent BKA via Vascular team on 04-20-23 and Rt. Knee washout on 04-21-23 via Orthopedics team.  Patient is currently on IV Vancomycin  and IV Ceftriaxone .  Pending Blood cultures and wound cultures.  PT/OT has been consulted pending evaluations.  TOC will follow for discharge planning.          Expected Discharge Plan and Services                                               Social Determinants of Health (SDOH) Interventions SDOH Screenings   Food Insecurity: No Food Insecurity (04/19/2023)  Housing: Low Risk  (04/19/2023)  Transportation Needs: No Transportation Needs (04/19/2023)  Utilities: Not At Risk (04/19/2023)  Financial Resource Strain: Patient Unable To Answer (02/20/2023)   Received from [facility]  Social Connections: Unknown (04/22/2023)  Tobacco Use: Low Risk  (04/21/2023)  Recent Concern: Tobacco Use - Medium Risk (01/28/2023)   Received from [facility]    Readmission Risk Interventions    02/18/2023   11:58 AM 02/15/2023    4:10 PM  Readmission Risk Prevention Plan  Transportation Screening  Complete  PCP or Specialist Appt within 3-5 Days Complete Complete  HRI or Home Care Consult Complete Complete  Social Work Consult for Recovery Care Planning/Counseling Complete Complete  Palliative Care Screening Not Applicable Not Applicable  Medication Review Oceanographer) Complete Complete

## 2023-04-22 NOTE — Evaluation (Signed)
 Physical Therapy Evaluation Patient Details Name: Manuel [name] MRN: [id number] DOB: July 10, 1945 Today's Date: 04/22/2023  History of Present Illness  Pt is a 77 y.o. male presenting to hospital 04/19/23 with c/o AMS, weakness, fall, and R leg swelling/pain.  Recently seen in ED for fall onto R knee causing effusion and pain; pt s/p R foot TMA 2 months prior.  Pt admitted with sepsis d/t cellulitis, AKI superimposed on CKD, hyponatremia, acute metabolic encephalopathy.  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  S/p R knee aspiration 04/20/23. S/p R knee joint I&D 04/21/23 d/t R septic knee.  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  Clinical Impression  Prior to recent medical concerns, pt was modified independent ambulating with [employer] [facility] [employer]; lives with his wife on main level of home with steep ramp to enter.  Pt pre-medicated with pain medication for therapy session but pt reporting 10/10 abdominal cramping pain (pt yelling out in pain during cramping but other times resting in bed not demonstrating pain-like behavior).  Pt declined any OOB mobility d/t intermittent pain but agreeable to repositioning on L side in bed to attempt to improve comfort (max assist logrolling to L).  Pt declined further activity d/t intermittent 10/10 abdominal cramping (nurse notified of pt's pain status).  Attempted to see pt in afternoon for mobility (nursing reported getting pt up to recliner with 3 assist) but pt refused to participate in therapy d/t not feeling up to it and wanting to rest in recliner.  Pt appearing confused (normally not confused per pt's wife); pt's wife present on 2nd therapy session attempt and able to provide PLOF and home set-up.  Pt's wife interested in inpatient rehab; therapist educated pt's wife on inpatient rehab/current status/recommendations; pt's wife appearing hopeful pt will improve in status and be  inpatient rehab appropriate.  Pt would currently benefit from skilled PT to address noted impairments and functional limitations (see below for any additional details).  Upon hospital discharge, pt would benefit from ongoing therapy.     If plan is discharge home, recommend the following: Two people to help with walking and/or transfers;Two people to help with bathing/dressing/bathroom;Assistance with cooking/housework;Direct supervision/assist for medications management;Direct supervision/assist for financial management;Assist for transportation;Help with stairs or ramp for entrance;Supervision due to cognitive status   Can travel by private vehicle   No    Equipment Recommendations Other (comment) (TBD at next facility)  Recommendations for Other Services  OT consult    Functional Status Assessment Patient has had a recent decline in their functional status and demonstrates the ability to make significant improvements in function in a reasonable and predictable amount of time.     Precautions / Restrictions Precautions Precautions: Fall Restrictions Weight Bearing Restrictions Per Provider Order: Yes RLE Weight Bearing Per Provider Order: Non weight bearing      Mobility  Bed Mobility Overal bed mobility: Needs Assistance Bed Mobility: Rolling Rolling: Max assist         General bed mobility comments: logrolling to L in bed using bed pad to logroll; vc's for technique    Transfers                   General transfer comment: Pt declined    Ambulation/Gait               General Gait Details: Pt declined  Art Therapist  Mobility     Tilt Bed    Modified Rankin (Stroke Patients Only)       Balance                                             Pertinent Vitals/Pain Pain Assessment Pain Assessment: 0-10 Pain Score: 10-Worst pain ever Pain Location: abdominal gas/cramping pain Pain Descriptors /  Indicators: Cramping Pain Intervention(s): Limited activity within patient's tolerance, Monitored during session, Premedicated before session, Repositioned, Patient requesting pain meds-RN notified    Home Living Family/patient expects to be discharged to:: Private residence Living Arrangements: Spouse/significant other Available Help at Discharge: Family;Available 24 hours/day Type of Home: House Home Access: Ramped entrance (ramp is steep though)       Home Layout: Able to live on main level with bedroom/bathroom;Two level;Laundry or work area in [employer] Equipment: Agricultural Consultant (2 wheels);BSC/3in1;Wheelchair - manual;Cane - single point;Shower seat (No leg rests on manual w/c though)      Prior Function Prior Level of Function : Independent/Modified Independent             Mobility Comments: Pt modified independent ambulating with SPC; recent falls reported.       Extremity/Trunk Assessment   Upper Extremity Assessment Upper Extremity Assessment: Generalized weakness    Lower Extremity Assessment Lower Extremity Assessment: RLE deficits/detail (L LE generalized weakness) RLE Deficits / Details: R LE appearing at least 3/5 AROM hip flexion; pt declined to bend/straighten R knee d/t pain (appearing to stay in approximately 20 degrees flexion) RLE: Unable to fully assess due to pain       Communication   Communication Communication: Difficulty communicating thoughts/reduced clarity of speech Cueing Techniques: Verbal cues;Visual cues  Cognition Arousal:  (Pt fluctuating between being alert and closing his eyes) Behavior During Therapy: Flat affect Overall Cognitive Status: Impaired/Different from baseline                                 General Comments: Oriented to person and some of general situation; increased time for pt to process information and respond.  Pt's wife reports pt is normally A&O x4        General Comments General comments  (skin integrity, edema, etc.): R BKA dressings in place    Exercises     Assessment/Plan    PT Assessment Patient needs continued PT services  PT Problem List Decreased strength;Decreased range of motion;Decreased activity tolerance;Decreased balance;Decreased mobility;Decreased cognition;Decreased knowledge of use of DME;Decreased safety awareness;Decreased knowledge of precautions;Decreased skin integrity;Pain       PT Treatment Interventions DME instruction;Gait training;Functional mobility training;Therapeutic activities;Therapeutic exercise;Balance training;Patient/family education;Wheelchair mobility training    PT Goals (Current goals can be found in the Care Plan section)  Acute Rehab PT Goals Patient Stated Goal: to feel better and have less pain PT Goal Formulation: With patient/family Time For Goal Achievement: 05/06/23 Potential to Achieve Goals: Fair    Frequency Min 1X/week     Co-evaluation               AM-PAC PT 6 Clicks Mobility  Outcome Measure Help needed turning from your back to your side while in a flat bed without using bedrails?: A Lot Help needed moving from lying on your back to sitting on the side of a flat bed without using  bedrails?: A Lot Help needed moving to and from a bed to a chair (including a wheelchair)?: Total Help needed standing up from a chair using your arms (e.g., wheelchair or bedside chair)?: Total Help needed to walk in hospital room?: Total Help needed climbing 3-5 steps with a railing? : Total 6 Click Score: 8    End of Session   Activity Tolerance: Patient limited by pain Patient left: in bed;with call bell/phone within reach;with bed alarm set Nurse Communication: Mobility status;Precautions;Patient requests pain meds;Weight bearing status;Other (comment) (Pt's pain status/abdominal cramping) PT Visit Diagnosis: Other abnormalities of gait and mobility (R26.89);Muscle weakness (generalized) (M62.81);History of  falling (Z91.81);Pain Pain - Right/Left: Right Pain - part of body: Knee;Leg    Time: 9061-9047 PT Time Calculation (min) (ACUTE ONLY): 14 min   Charges:   PT Evaluation $PT Eval Low Complexity: 1 Low   PT General Charges $$ ACUTE PT VISIT: 1 Visit        Damien Caulk, PT 04/22/23, 4:49 PM

## 2023-04-22 NOTE — Progress Notes (Signed)
 Orthopedic progress note  Chief complaint septic right knee  Subjective, no events overnight  Objective, chart reviewed Or cultures are pending Patient is on Vanco and ceftriaxone    The surgical dressing is clean and dry.  The patient is more comfortable on exam today and overall appears in less distress and pain.  He tolerates palpation around the knee much better than he did yesterday.  No concerns on the exam.    77 year old male with multiple medical problems who is postoperative day #1 status post surgical washout of the right septic knee.  Prior to that he had a BKA on the same side by the vascular team.  Patient has multiple medical problems including peripheral vascular disease and diabetes which have contributed to his clinical picture.  Overall doing well from surgery of the right knee yesterday.  Recommend course of antibiotics as per ID Patient has staples in his wound which should be removed in 2 to 3 weeks The dressing changes can be per vascular routine for the BKA management.  Thank you   I am off service today.  Please call orthopedics with any questions or concerns.   Redell Kerbs, MD, [facility]

## 2023-04-23 DIAGNOSIS — M86261 Subacute osteomyelitis, right tibia and fibula: Secondary | ICD-10-CM | POA: Diagnosis not present

## 2023-04-23 LAB — CBC WITH DIFFERENTIAL/PLATELET
Abs Immature Granulocytes: 0.11 10*3/uL — ABNORMAL HIGH (ref 0.00–0.07)
Basophils Absolute: 0 10*3/uL (ref 0.0–0.1)
Basophils Relative: 0 %
Eosinophils Absolute: 0.2 10*3/uL (ref 0.0–0.5)
Eosinophils Relative: 2 %
HCT: 25.1 % — ABNORMAL LOW (ref 39.0–52.0)
Hemoglobin: 8.5 g/dL — ABNORMAL LOW (ref 13.0–17.0)
Immature Granulocytes: 1 %
Lymphocytes Relative: 24 %
Lymphs Abs: 2.6 10*3/uL (ref 0.7–4.0)
MCH: 28.7 pg (ref 26.0–34.0)
MCHC: 33.9 g/dL (ref 30.0–36.0)
MCV: 84.8 fL (ref 80.0–100.0)
Monocytes Absolute: 1.1 10*3/uL — ABNORMAL HIGH (ref 0.1–1.0)
Monocytes Relative: 10 %
Neutro Abs: 6.8 10*3/uL (ref 1.7–7.7)
Neutrophils Relative %: 63 %
Platelets: 410 10*3/uL — ABNORMAL HIGH (ref 150–400)
RBC: 2.96 MIL/uL — ABNORMAL LOW (ref 4.22–5.81)
RDW: 16.7 % — ABNORMAL HIGH (ref 11.5–15.5)
WBC: 11 10*3/uL — ABNORMAL HIGH (ref 4.0–10.5)
nRBC: 0 % (ref 0.0–0.2)

## 2023-04-23 LAB — BASIC METABOLIC PANEL
Anion gap: 12 (ref 5–15)
BUN: 31 mg/dL — ABNORMAL HIGH (ref 8–23)
CO2: 18 mmol/L — ABNORMAL LOW (ref 22–32)
Calcium: 8.4 mg/dL — ABNORMAL LOW (ref 8.9–10.3)
Chloride: 104 mmol/L (ref 98–111)
Creatinine, Ser: 0.81 mg/dL (ref 0.61–1.24)
GFR, Estimated: >60 mL/min (ref >60)
Glucose, Bld: 100 mg/dL — ABNORMAL HIGH (ref 70–99)
Potassium: 4.7 mmol/L (ref 3.5–5.1)
Sodium: 134 mmol/L — ABNORMAL LOW (ref 135–145)

## 2023-04-23 LAB — GLUCOSE, CAPILLARY
Glucose-Capillary: 105 mg/dL — ABNORMAL HIGH (ref 70–99)
Glucose-Capillary: 183 mg/dL — ABNORMAL HIGH (ref 70–99)
Glucose-Capillary: 148 mg/dL — ABNORMAL HIGH (ref 70–99)
Glucose-Capillary: 123 mg/dL — ABNORMAL HIGH (ref 70–99)

## 2023-04-23 MED ORDER — VANCOMYCIN HCL 2000 MG/400ML IV SOLN
2000.0000 mg | INTRAVENOUS | Status: AC
Start: 2023-04-24 — End: 2023-04-25
  Administered 2023-04-24 – 2023-04-25 (×2): 2000 mg via INTRAVENOUS
  Filled 2023-04-23 (×2): qty 400

## 2023-04-23 NOTE — NC FL2 (Signed)
 [place]  MEDICAID FL2 LEVEL OF CARE FORM     IDENTIFICATION  Patient Name: Manuel [name] Birthdate: January 15, 1946 Sex: male Admission Date (Current Location): 04/18/2023  [place] and [place] Number:  Chiropodist and Address:  [facility], [address] [place], [place] [address]      Provider Number: [id number]  Attending Physician Name and Address:  Barbarann Nest, MD  Relative Name and Phone Number:  Jaqua, Ching (Spouse)  [phone]    Current Level of Care:   Recommended Level of Care: Skilled Nursing Facility Prior Approval Number:    Date Approved/Denied:   PASRR Number: [id number]  Discharge Plan: SNF    Current Diagnoses: Patient Active Problem List   Diagnosis Date Noted   Osteomyelitis (HCC) 04/19/2023   Acute kidney injury superimposed on chronic kidney disease (HCC) 04/19/2023   Hyponatremia 04/19/2023   Uncontrolled type 2 diabetes mellitus with hyperglycemia, with long-term current use of insulin  (HCC) 04/19/2023   Essential hypertension 04/19/2023   Dyslipidemia 04/19/2023   BPH (benign prostatic hyperplasia) 04/19/2023   Acute metabolic encephalopathy 04/19/2023   Diabetic foot ulcer (HCC) 02/14/2023   Generalized weakness 02/14/2023   NSTEMI (non-ST elevated myocardial infarction) (HCC) 01/15/2023   Cellulitis of foot 11/26/2022   Toe necrosis (HCC) 11/26/2022   Adjustment disorder with depressed mood 11/14/2022   Chronic combined systolic and diastolic CHF (congestive heart failure) (HCC) 11/12/2022   Ischemic ulcer of lower extremity (HCC) 11/10/2022   Limb ischemia 11/10/2022   Left flank pain 06/16/2021   Constipation 06/16/2021   Foot ulcer, right (HCC) 06/14/2021   Lower limb ischemia right leg with history of angioplasty and stent on 05/14/2021 06/13/2021   Alcohol  abuse 06/07/2021   Coronary artery disease with history of myocardial infarction without history of CABG 05/08/2021   S/P  angioplasty with stent 05/08/2021   PAD (peripheral artery disease) (HCC) 05/08/2021   Diabetic polyneuropathy associated with type 2 diabetes mellitus (HCC) 09/25/2017   Lumbar radiculopathy 01/27/2017   Lumbar degenerative disc disease 01/27/2017   Lumbar spondylosis 01/27/2017   Lumbar facet arthropathy 01/27/2017   Diabetes mellitus type 2, insulin  dependent (HCC) 10/18/2014   Hypertension 10/05/2014   Bilateral carotid artery stenosis 04/05/2014   Microalbuminuria 12/19/2013   Gout 09/21/2013   Mixed hyperlipidemia 09/14/2013    Orientation RESPIRATION BLADDER Height & Weight     Self, Time, Situation, Place  O2 Incontinent Weight: 184 lb 15.5 oz (83.9 kg) Height:  5' 8 (172.7 cm)  BEHAVIORAL SYMPTOMS/MOOD NEUROLOGICAL BOWEL NUTRITION STATUS      Continent Diet  AMBULATORY STATUS COMMUNICATION OF NEEDS Skin   Extensive Assist Verbally Normal                       Personal Care Assistance Level of Assistance  Bathing, Feeding, Dressing Bathing Assistance: Maximum assistance Feeding assistance: Limited assistance Dressing Assistance: Maximum assistance     Functional Limitations Info  Sight, Speech, Hearing Sight Info: Adequate Hearing Info: Adequate Speech Info: Adequate    SPECIAL CARE FACTORS FREQUENCY  PT (By licensed PT), OT (By licensed OT)     PT Frequency: 5 times a week OT Frequency: 5 times a week            Contractures Contractures Info: Not present    Additional Factors Info  Code Status Code Status Info: DNR-limited             Current Medications (04/23/2023):  This is the current  hospital active medication list Current Facility-Administered Medications  Medication Dose Route Frequency Provider Last Rate Last Admin   acetaminophen  (TYLENOL ) tablet 650 mg  650 mg Oral Q6H PRN Zafonte, Redell Ned, MD       Or   acetaminophen  (TYLENOL ) suppository 650 mg  650 mg Rectal Q6H PRN Zafonte, Redell Ned, MD       aspirin  EC tablet 81  mg  81 mg Oral Daily Zafonte, Brian Lynx, MD   81 mg at 04/23/23 9047   atorvastatin  (LIPITOR ) tablet 80 mg  80 mg Oral QHS Zafonte, Brian Luz, MD   80 mg at 04/22/23 2040   carvedilol  (COREG ) tablet 25 mg  25 mg Oral BID WC Zafonte, Brian Euriah, MD   25 mg at 04/23/23 0751   cefTRIAXone  (ROCEPHIN ) 2 g in sodium chloride  0.9 % 100 mL IVPB  2 g Intravenous Q24H Zafonte, Brian Cortney, MD   Stopped at 04/22/23 1444   clopidogrel  (PLAVIX ) tablet 75 mg  75 mg Oral Daily Zafonte, Brian Jacier, MD   75 mg at 04/23/23 9046   colchicine  tablet 0.6 mg  0.6 mg Oral BID Zafonte, Brian Parley, MD   0.6 mg at 04/23/23 9047   dapagliflozin  propanediol (FARXIGA ) tablet 10 mg  10 mg Oral Daily Zafonte, Brian Atreus, MD   10 mg at 04/23/23 9043   enoxaparin  (LOVENOX ) injection 40 mg  40 mg Subcutaneous Q24H Zafonte, Brian Raymound, MD   40 mg at 04/23/23 9040   folic acid  (FOLVITE ) tablet 1 mg  1 mg Oral Daily Zafonte, Brian Salahuddin, MD   1 mg at 04/23/23 9047   hydrALAZINE  (APRESOLINE ) tablet 25 mg  25 mg Oral Q6H PRN Zafonte, Brian Ashrith, MD       insulin  aspart (novoLOG ) injection 0-20 Units  0-20 Units Subcutaneous TID WC Zafonte, Brian Buckley, MD   4 Units at 04/23/23 1138   insulin  aspart (novoLOG ) injection 0-5 Units  0-5 Units Subcutaneous QHS Zafonte, Brian Samnang, MD   2 Units at 04/20/23 2215   insulin  aspart protamine - aspart (NOVOLOG  MIX 70/30) injection 30 Units  30 Units Subcutaneous BID WC Zafonte, Brian Fabian, MD   30 Units at 04/23/23 9049   iron  polysaccharides (NIFEREX) capsule 150 mg  150 mg Oral Daily Zafonte, Brian Franz, MD   150 mg at 04/23/23 9044   menthol -cetylpyridinium (CEPACOL) lozenge 3 mg  1 lozenge Oral PRN Barbarann Nest, MD       mirtazapine  (REMERON ) tablet 15 mg  15 mg Oral QHS Zafonte, Brian Kleber, MD   15 mg at 04/22/23 2039   morphine  (PF) 4 MG/ML injection 4 mg  4 mg Intravenous Q4H PRN Zafonte, Brian Adarsh, MD   4 mg at 04/23/23 0154   multivitamin with minerals  tablet 1 tablet  1 tablet Oral Daily Zafonte, Brian Cochise, MD   1 tablet at 04/23/23 9041   mupirocin  ointment (BACTROBAN ) 2 % 1 Application  1 Application Nasal BID Zafonte, Brian Israel, MD   1 Application at 04/23/23 0957   ondansetron  (ZOFRAN ) tablet 4 mg  4 mg Oral Q6H PRN Zafonte, Brian Kenyatta, MD       Or   ondansetron  (ZOFRAN ) injection 4 mg  4 mg Intravenous Q6H PRN Zafonte, Redell Ned, MD       Oral care mouth rinse  15 mL Mouth Rinse PRN Barbarann Nest, MD       oxyCODONE -acetaminophen  (PERCOCET/ROXICET) 5-325 MG per tablet 1 tablet  1 tablet Oral Q6H PRN Zafonte,  Redell Ned, MD   1 tablet at 04/23/23 1005   pregabalin  (LYRICA ) capsule 75 mg  75 mg Oral BID Zafonte, Brian Tyr, MD   75 mg at 04/23/23 9047   protein supplement (ENSURE MAX) liquid  11 oz Oral Daily Zafonte, Brian Leverett, MD   11 oz at 04/23/23 9045   sacubitril -valsartan  (ENTRESTO ) 24-26 mg per tablet  1 tablet Oral Q12H Yates, Jennifer, MD   1 tablet at 04/23/23 9044   senna-docusate (Senokot-S) tablet 1 tablet  1 tablet Oral QHS PRN Zafonte, Brian Jhamari, MD   1 tablet at 04/22/23 1654   simethicone  (MYLICON) chewable tablet 80 mg  80 mg Oral QID PRN Barbarann Nest, MD   80 mg at 04/23/23 1005   spironolactone  (ALDACTONE ) tablet 50 mg  50 mg Oral Daily Barbarann Nest, MD   50 mg at 04/23/23 9045   tamsulosin  (FLOMAX ) capsule 0.4 mg  0.4 mg Oral QPC supper Zafonte, Brian Liliana, MD   0.4 mg at 04/22/23 1811   thiamine  (VITAMIN B1) tablet 100 mg  100 mg Oral Daily Zafonte, Brian Tacuma, MD   100 mg at 04/23/23 9046   traZODone  (DESYREL ) tablet 25 mg  25 mg Oral QHS PRN Zafonte, Brian Taylon, MD       vancomycin  (VANCOREADY) IVPB 1500 mg/300 mL  1,500 mg Intravenous Q24H Elesa Perkins, RPH 150 mL/hr at 04/23/23 0747 1,500 mg at 04/23/23 0747     Discharge Medications: Please see discharge summary for a list of discharge medications.  Relevant Imaging Results:  Relevant Lab Results:   Additional  Information SS [id number]  Ladene Lady, LCSW

## 2023-04-23 NOTE — Progress Notes (Signed)
 Physical Therapy Treatment Patient Details Name: Manuel [name] MRN: [id number] DOB: March 10, 1946 Today's Date: 04/23/2023   History of Present Illness Pt is a 78 y.o. male presenting to hospital 04/19/23 with c/o AMS, weakness, fall, and R leg swelling/pain.  Recently seen in ED for fall onto R knee causing effusion and pain; pt s/p R foot TMA 2 months prior.  Pt admitted with sepsis d/t cellulitis, AKI superimposed on CKD, hyponatremia, acute metabolic encephalopathy.  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  S/p R knee aspiration 04/20/23. S/p R knee joint I&D 04/21/23 d/t R septic knee.  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    PT Comments  PT session with OT for safe mobility and pt/therapist safety. Pt initially lethargic, possibly from pain meds however able to arouse and participate in session. Educated pt's wife on reducing hospital delirium and improving sleep patterns. Pt required +2 assist to transfer to EOB and stand from elevated surface with RW. Min/ModA of 2 to shuffle L LE towards bedside chair with RW and multimodal cues to maintain upright posture and L knee extension. Pt assisted to chair with LE's elevated to facilitate R knee extension due to 25 degree extension lag. Educated wife and pt on positioning, BKA exercises, and desensitization techniques through demonstration and handouts. Pt appears to be clearing cognitively and was completely functionally independent and A&Ox4 prior to surgery. At this time PT and OT feel pt would be an excellent candidate for CIR once medically stable. MD and CM notified. Continue PT per POC acutely.    If plan is discharge home, recommend the following: Two people to help with walking and/or transfers;Two people to help with bathing/dressing/bathroom;Assistance with cooking/housework;Direct supervision/assist for medications management;Direct  supervision/assist for financial management;Assist for transportation;Help with stairs or ramp for entrance;Supervision due to cognitive status   Can travel by private vehicle     No  Equipment Recommendations  Other (comment) (TBD at next level of care)    Recommendations for Other Services       Precautions / Restrictions Precautions Precautions: Fall Restrictions Weight Bearing Restrictions Per Provider Order: Yes RLE Weight Bearing Per Provider Order: Non weight bearing Other Position/Activity Restrictions: R BKA     Mobility  Bed Mobility Overal bed mobility: Needs Assistance Bed Mobility: Supine to Sit Rolling: Max assist   Supine to sit: Max assist, +2 for physical assistance, Used rails, HOB elevated     General bed mobility comments: VC for sequencing    Transfers Overall transfer level: Needs assistance Equipment used: Rolling walker (2 wheels) Transfers: Sit to/from Stand Sit to Stand: From elevated surface, Min assist, +2 physical assistance           General transfer comment: VC, L foot blocked    Ambulation/Gait               General Gait Details: Pt shuffled to bedside chair with RW and MinA of 2. Multimodal cues to return to upright standing   Stairs             Wheelchair Mobility     Tilt Bed    Modified Rankin (Stroke Patients Only)       Balance Overall balance assessment: Needs assistance Sitting-balance support: Feet supported, Feet unsupported, No upper extremity supported, Single extremity supported Sitting balance-Leahy Scale: Fair Sitting balance - Comments: Pt sat EOB for several minutes with SBA   Standing balance support:  Reliant on assistive device for balance, During functional activity, Bilateral upper extremity supported Standing balance-Leahy Scale: Poor Standing balance comment: heavy BUE support on RW in standing for balance                            Cognition Arousal: Alert  (Initially lethargic, but able to awaken with blinds and conversation) Behavior During Therapy: [facility] for tasks assessed/performed Overall Cognitive Status: Impaired/Different from baseline Area of Impairment: Problem solving, Following commands, Safety/judgement                       Following Commands: Follows one step commands with increased time Safety/Judgement: Decreased awareness of safety, Decreased awareness of deficits   Problem Solving: Slow processing, Requires verbal cues General Comments: Oriented to person and some of general situation; increased time for pt to process information and respond.  Pt's wife reports pt is normally A&O x4        Exercises Amputee Exercises Quad Sets: AROM, Right, 10 reps, Seated Knee Flexion: AAROM, Right, 10 reps, Seated Knee Extension: AROM, Right, 10 reps, Seated Straight Leg Raises: AAROM, Right, 5 reps, Supine Chair Push Up: AROM, Strengthening, 5 reps, Seated Other Exercises Other Exercises: Pt educated in desensitization strategies to promote recovery and prepare residual limb for future prosthesis. Wife and pt given education handouts on proper positioning to prevent contractures, stretching and strengthening exercises and timeline expectation s/p BKA    General Comments General comments (skin integrity, edema, etc.): R BKA stump dressing intact. 95% on RA throughout session. Non-productive cough      Pertinent Vitals/Pain Pain Assessment Pain Assessment: Faces Faces Pain Scale: Hurts little more Pain Location: throat hurts, distal stump with bed mobility Pain Descriptors / Indicators: Sore Pain Intervention(s): Limited activity within patient's tolerance, Premedicated before session    Home Living Family/patient expects to be discharged to:: Private residence Living Arrangements: Spouse/significant other Available Help at Discharge: Family;Available 24 hours/day Type of Home: House Home Access: Ramped entrance (ramp  is steep though)       Home Layout: Able to live on main level with bedroom/bathroom;Two level;Laundry or work area in [employer] Equipment: Agricultural Consultant (2 wheels);BSC/3in1;Wheelchair - manual;Cane - single point;Shower seat (No leg rests on manual w/c though)      Prior Function            PT Goals (current goals can now be found in the care plan section) Acute Rehab PT Goals Patient Stated Goal: to feel better and have less pain Progress towards PT goals: Progressing toward goals    Frequency    Min 1X/week      PT Plan      Co-evaluation PT/OT/SLP Co-Evaluation/Treatment: Yes Reason for Co-Treatment: For patient/therapist safety;To address functional/ADL transfers PT goals addressed during session: Mobility/safety with mobility;Balance;Proper use of DME OT goals addressed during session: ADL's and self-care;Proper use of Adaptive equipment and DME      AM-PAC PT 6 Clicks Mobility   Outcome Measure  Help needed turning from your back to your side while in a flat bed without using bedrails?: A Lot Help needed moving from lying on your back to sitting on the side of a flat bed without using bedrails?: A Lot Help needed moving to and from a bed to a chair (including a wheelchair)?: A Lot Help needed standing up from a chair using your arms (e.g., wheelchair or bedside chair)?: A Lot Help needed  to walk in hospital room?: Total Help needed climbing 3-5 steps with a railing? : Total 6 Click Score: 10    End of Session Equipment Utilized During Treatment: Gait belt Activity Tolerance: Patient tolerated treatment well Patient left: in chair;with call bell/phone within reach;with chair alarm set;with family/visitor present Nurse Communication: Mobility status;Precautions;Weight bearing status;Other (comment) (Proper positioning) PT Visit Diagnosis: Other abnormalities of gait and mobility (R26.89);Muscle weakness (generalized) (M62.81);History of falling  (Z91.81);Pain Pain - Right/Left: Right Pain - part of body: Knee;Leg     Time: 8869-8773 PT Time Calculation (min) (ACUTE ONLY): 56 min  Charges:    $Therapeutic Exercise: 8-22 mins $Therapeutic Activity: 8-22 mins PT General Charges $$ ACUTE PT VISIT: 1 Visit                    Darice Bohr, PTA  Darice JAYSON Bohr 04/23/2023, 4:33 PM

## 2023-04-23 NOTE — Progress Notes (Signed)
 Subjective: 2 Days Post-Op Procedure(s) (LRB): IRRIGATION AND DEBRIDEMENT KNEE (Right) Patient s/p right BKA performed on 04/20/23 by vascular surgery. When asked about pain, he states I don't really know what to tell you Patient is doing well, no acute complaints this morning.  Reports moderate pain in the right knee but does not feel that it is worsening at this time. PT and care management to assist with discharge planning. Negative for chest pain and shortness of breath Fever: Low grade fever last night, 99 temp this morning. Gastrointestinal:Negative for nausea and vomiting  Objective: Vital signs in last 24 hours: Temp:  [98 F (36.7 C)-99.1 F (37.3 C)] 99 F (37.2 C) (12/31 2347) Pulse Rate:  [70-76] 72 (12/31 2347) Resp:  [17-22] 20 (12/31 2347) BP: (106-132)/(49-84) 131/49 (12/31 2347) SpO2:  [98 %-99 %] 99 % (12/31 2347)  Intake/Output from previous day:  Intake/Output Summary (Last 24 hours) at 04/23/2023 0720 Last data filed at 04/22/2023 2049 Gross per 24 hour  Intake 710 ml  Output 700 ml  Net 10 ml    Intake/Output this shift: No intake/output data recorded.  Labs: Recent Labs    04/21/23 1324 04/22/23 0456 04/23/23 0417  HGB 8.1* 8.2* 8.5*   Recent Labs    04/22/23 0456 04/23/23 0417  WBC 11.3* 11.0*  RBC 2.88* 2.96*  HCT 24.0* 25.1*  PLT 366 410*   Recent Labs    04/22/23 0456 04/23/23 0417  NA 131* 134*  K 5.2* 4.7  CL 104 104  CO2 20* 18*  BUN 35* 31*  CREATININE 0.98 0.81  GLUCOSE 145* 100*  CALCIUM  8.1* 8.4*   No results for input(s): LABPT, INR in the last 72 hours.  EXAM General - Patient is Alert and Appropriate Extremity - Dressing covering the right BKA site, covering part of the ACE wrap applied to the right knee. ACE wrap left in place this morning, no purulent drainage noted. Tolerated palpation around the right knee with mild pain this morning. Intact bowel sounds, abdomen soft to palpation.  Past Medical  History:  Diagnosis Date   Acute ST elevation myocardial infarction (STEMI) of inferior wall (HCC) 04/19/2010   a.) transfered from [facility] to [facility] --> LHC/PCI (very difficult procedure) --> 3.0 x 23 mm and 3.0 x 12 mm Xience stents to RCA   Allergies    Arthritis    Benign essential hypertension    Bilateral carotid artery disease (HCC) 05/08/2021   a.) carotid doppler 05/08/2021: 1-39% BICA   CAD (coronary artery disease) 04/19/2010   a.) inferior STEMI 04/19/2010 --> LHC/PCI: 50-70% pD1, 80% pRI, 90/90/90% RCA (overlapping 3.0 x 23 and 3.0 x 12 mm Xience DES); b.) MV 11/10/2018: fixed minimally reversible inferior perfusion defect   Cellulitis of foot    DDD (degenerative disc disease), cervical    Diabetes mellitus type 2, insulin  dependent (HCC)    Diverticulosis    Full dentures    Gout    Hard of hearing    History of bilateral cataract extraction 2022   History of ETOH abuse    Hyperlipidemia    Ischemic cardiomyopathy 04/19/2010   a.) TTE 04/19/2010: 40%; b.) TTE 04/20/2014: EF >55%, mild RVE, triv PR, mild MR/TR, G1DD; c.) TTE 11/10/2018: EF 45%, inf HK, mild RVE, triv TR/PR, mild MR, G1DD; d.) TTE 11/14/2022: EF 25-30%, basal anteroseptal, apical lateral, apical septal, and apex AK, mid anterolateral HK, mild-mod MR/TR   Long term current use of aspirin     Long term current  use of clopidogrel     Lumbar degenerative disc disease    Lumbar radiculopathy    Lumbar vertebral fracture (chronic superior endplate of L1)    OSA (obstructive sleep apnea)    a.) unable to tolerate nocturnal PAP therapy   Peripheral artery disease (HCC)    a.) stenting 05/14/21: 12 mm x 12 cm LifeStent RIGHT dis SFA/prox pop; b.) s/p cath directed thrombolysis RIGHT SFA/pop 06/14/21; c.) s/p mech thrombectomy + stenting 06/15/21: 8 mm x 25cm & 8 mm x 7.5cm Viabahn; d.) s/p BILAT CFA, profunda femoris, SFA endarterectomies + fogarty embolectomy + stenting 11/15/22: 12mm x 58mm Lifestream BILAT CIAs, 14 mm x 6  cm Lifestream & 13 mm x 5 cm Viabahn LEFT EIA   Peripheral neuropathy    Umbilical hernia    Assessment/Plan: 2 Days Post-Op Procedure(s) (LRB): IRRIGATION AND DEBRIDEMENT KNEE (Right) Principal Problem:   Osteomyelitis (HCC) Active Problems:   Hypertension   Coronary artery disease with history of myocardial infarction without history of CABG   Diabetes mellitus type 2, insulin  dependent (HCC)   Mixed hyperlipidemia   Lower limb ischemia right leg with history of angioplasty and stent on 05/14/2021   Chronic combined systolic and diastolic CHF (congestive heart failure) (HCC)   Acute kidney injury superimposed on chronic kidney disease (HCC)   Hyponatremia   Uncontrolled type 2 diabetes mellitus with hyperglycemia, with long-term current use of insulin  (HCC)   Essential hypertension   Dyslipidemia   BPH (benign prostatic hyperplasia)   Acute metabolic encephalopathy  Estimated body mass index is 28.12 kg/m as calculated from the following:   Height as of this encounter: 5' 8 (1.727 m).   Weight as of this encounter: 83.9 kg. Up with therapy as tolerated.  Low grade fevers recently, temp 98.1 this morning.  WBC 11.0 this AM. Cultures from surgery still pending, gram stain with Moderate gram + cocci Continue on Rocephin  and Vacomycin per infectious disease. WIll defer to vascular for wound changes to the right BKA. Per [facility], staples to be removed in 2-3 weeks.  DVT Prophylaxis - Lovenox  and Plavix  NWB to right lower extremity.  DOROTHA Gustavo Level, PA-C [facility] Orthopaedic Surgery 04/23/2023, 7:20 AM

## 2023-04-23 NOTE — TOC Progression Note (Signed)
 Transition of Care [facility]) - Progression Note    Patient Details  Name: Manuel [name] MRN: [id number] Date of Birth: [birth year]  [facility] CM/SW Contact  Ladene Lady, LCSW Phone Number: 04/23/2023, 10:03 AM  Clinical Narrative:   CSW met with wife and pt at bedside. Wife reports she has a wheelchair, BSC and shower chair at home. She would like pt evaluated for [facility] at [facility] and if they are unable to take she would like referrals sent out in the [place] area. MD and PT messaged to see if this referral could be placed. She would be able to be there with pt 24/7 if AIR.           Expected Discharge Plan and Services                                               Social Determinants of Health (SDOH) Interventions SDOH Screenings   Food Insecurity: No Food Insecurity (04/19/2023)  Housing: Low Risk  (04/19/2023)  Transportation Needs: No Transportation Needs (04/19/2023)  Utilities: Not At Risk (04/19/2023)  Financial Resource Strain: Patient Unable To Answer (02/20/2023)   Received from [facility]  Social Connections: Unknown (04/22/2023)  Tobacco Use: Low Risk  (04/21/2023)  Recent Concern: Tobacco Use - Medium Risk (01/28/2023)   Received from [facility]    Readmission Risk Interventions    02/18/2023   11:58 AM 02/15/2023    4:10 PM  Readmission Risk Prevention Plan  Transportation Screening  Complete  PCP or Specialist Appt within 3-5 Days Complete Complete  HRI or Home Care Consult Complete Complete  Social Work Consult for Recovery Care Planning/Counseling Complete Complete  Palliative Care Screening Not Applicable Not Applicable  Medication Review Oceanographer) Complete Complete

## 2023-04-23 NOTE — Evaluation (Signed)
 Occupational Therapy Evaluation Patient Details Name: Manuel [name] MRN: [id number] DOB: 05/26/1945 Today's Date: 04/23/2023   History of Present Illness Pt is a 78 y.o. male presenting to hospital 04/19/23 with c/o AMS, weakness, fall, and R leg swelling/pain.  Recently seen in ED for fall onto R knee causing effusion and pain; pt s/p R foot TMA 2 months prior.  Pt admitted with sepsis d/t cellulitis, AKI superimposed on CKD, hyponatremia, acute metabolic encephalopathy.  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  S/p R knee aspiration 04/20/23. S/p R knee joint I&D 04/21/23 d/t R septic knee.  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   Clinical Impression   Pt was seen for OT evaluation and co-tx with PT this date to optimize safety with ADL mobility tasks. Prior to hospital admission, pt was ambulating with a SPC and living with his spouse in a 2 story home, living on the main floor. Pt sleeping upon entry, wife agreeable to therapists attempt to wake for participation. Pt presents to acute OT demonstrating impaired ADL performance and functional mobility 2/2 decreased cognition, balance, strength, and residual limb pain (See OT problem list for additional functional deficits). Pt currently requires MAX A +2 for bed mobility improving to MIN A +2 to stand from elevated bed with RW. Pt required MINA +2 for shuffled pivot to the recliner. Pt cognition noted to be improving from previous date, wife agrees. Pt able to follow simple commands with cues. Wife very eager for pt to improve and willing to assist as needed. Pt would benefit from high intensity skilled OT services (>3hrs/day) to address noted impairments and functional limitations (see below for any additional details) in order to maximize safety and independence while minimizing falls risk and caregiver burden.     If plan is discharge home, recommend the  following: A lot of help with walking and/or transfers;A lot of help with bathing/dressing/bathroom;Assistance with cooking/housework;Assist for transportation;Help with stairs or ramp for entrance;Direct supervision/assist for medications management;Direct supervision/assist for financial management;Supervision due to cognitive status    Functional Status Assessment  Patient has had a recent decline in their functional status and demonstrates the ability to make significant improvements in function in a reasonable and predictable amount of time.  Equipment Recommendations  None recommended by OT    Recommendations for Other Services Rehab consult     Precautions / Restrictions Precautions Precautions: Fall Restrictions Weight Bearing Restrictions Per Provider Order: Yes RLE Weight Bearing Per Provider Order: Non weight bearing Other Position/Activity Restrictions: R BKA      Mobility Bed Mobility Overal bed mobility: Needs Assistance Bed Mobility: Supine to Sit     Supine to sit: Max assist, +2 for physical assistance, Used rails, HOB elevated     General bed mobility comments: VC for sequencing    Transfers Overall transfer level: Needs assistance Equipment used: Rolling walker (2 wheels) Transfers: Sit to/from Stand Sit to Stand: From elevated surface, Min assist, +2 physical assistance           General transfer comment: VC, L foot blocked      Balance Overall balance assessment: Needs assistance Sitting-balance support: Feet supported, Feet unsupported, No upper extremity supported, Single extremity supported Sitting balance-Leahy Scale: Fair     Standing balance support: Reliant on assistive device for balance, During functional activity, Bilateral upper extremity supported Standing balance-Leahy Scale: Poor Standing balance comment: heavy BUE support on RW in  standing for balance                           ADL either performed or assessed with  clinical judgement   ADL Overall ADL's : Needs assistance/impaired                                       General ADL Comments: Pt currently requires MAX A for seated LB ADL tasks, MIN A +2 for ADL transfers with RW, and MIN A for seated UB bathing/dressing.     Vision         Perception         Praxis         Pertinent Vitals/Pain Pain Assessment Pain Assessment: Faces Faces Pain Scale: Hurts little more Pain Location: throat hurts Pain Descriptors / Indicators: Sore Pain Intervention(s): Monitored during session, Premedicated before session, Repositioned     Extremity/Trunk Assessment Upper Extremity Assessment Upper Extremity Assessment: Generalized weakness   Lower Extremity Assessment Lower Extremity Assessment: Generalized weakness;RLE deficits/detail RLE Deficits / Details: s/p BKA RLE: Unable to fully assess due to pain       Communication     Cognition Arousal: Alert (initially lethargic but improves with positioning and environmental mods) Behavior During Therapy: WFL for tasks assessed/performed Overall Cognitive Status: Impaired/Different from baseline Area of Impairment: Problem solving, Following commands, Safety/judgement                       Following Commands: Follows one step commands with increased time Safety/Judgement: Decreased awareness of safety, Decreased awareness of deficits   Problem Solving: Slow processing, Requires verbal cues       General Comments       Exercises Other Exercises Other Exercises: Pt educated in desensitization strategies to promote recovery and prepare residual limb for future prosthesis.   Shoulder Instructions      Home Living Family/patient expects to be discharged to:: Private residence Living Arrangements: Spouse/significant other Available Help at Discharge: Family;Available 24 hours/day Type of Home: House Home Access: Ramped entrance (ramp is steep though)     Home  Layout: Able to live on main level with bedroom/bathroom;Two level;Laundry or work area in basement     [employer] Shower/Tub: Chief Strategy Officer: Handicapped height     Home Equipment: Agricultural Consultant (2 wheels);BSC/3in1;Wheelchair - manual;Cane - single point;Shower seat (No leg rests on manual w/c though)          Prior Functioning/Environment Prior Level of Function : Independent/Modified Independent             Mobility Comments: Pt modified independent ambulating with SPC; recent falls reported.          OT Problem List: Decreased strength;Pain;Decreased cognition;Decreased range of motion;Decreased activity tolerance;Impaired balance (sitting and/or standing);Decreased knowledge of use of DME or AE;Decreased knowledge of precautions      OT Treatment/Interventions: Self-care/ADL training;Therapeutic exercise;Therapeutic activities;Cognitive remediation/compensation;Energy conservation;DME and/or AE instruction;Patient/family education;Balance training;Manual therapy    OT Goals(Current goals can be found in the care plan section) Acute Rehab OT Goals Patient Stated Goal: get as well as possible before going home OT Goal Formulation: With patient/family Time For Goal Achievement: 05/07/23 Potential to Achieve Goals: Good ADL Goals Pt Will Perform Lower Body Dressing: with modified independence;sitting/lateral leans Pt Will Transfer to Toilet: with supervision;bedside commode;ambulating (LRAD) Pt  Will Perform Toileting - Clothing Manipulation and hygiene: sitting/lateral leans;with modified independence Additional ADL Goal #1: Pt will demonstrate independence with desensitization strategies to support residual limb recovery. Additional ADL Goal #2: Pt will complete all aspects of a seated bath with set up and supv for safety.  OT Frequency: Min 1X/week    Co-evaluation PT/OT/SLP Co-Evaluation/Treatment: Yes Reason for Co-Treatment: For patient/therapist  safety;To address functional/ADL transfers PT goals addressed during session: Mobility/safety with mobility;Balance;Proper use of DME OT goals addressed during session: ADL's and self-care;Proper use of Adaptive equipment and DME      AM-PAC OT 6 Clicks Daily Activity     Outcome Measure Help from another person eating meals?: None Help from another person taking care of personal grooming?: A Little Help from another person toileting, which includes using toliet, bedpan, or urinal?: Total Help from another person bathing (including washing, rinsing, drying)?: A Lot Help from another person to put on and taking off regular upper body clothing?: A Little Help from another person to put on and taking off regular lower body clothing?: A Lot 6 Click Score: 15   End of Session Equipment Utilized During Treatment: Gait belt;Rolling walker (2 wheels) Nurse Communication: Mobility status  Activity Tolerance: Patient tolerated treatment well Patient left: in chair;with call bell/phone within reach;with chair alarm set;with family/visitor present;Other (comment) (PTA)  OT Visit Diagnosis: Other abnormalities of gait and mobility (R26.89);Pain Pain - Right/Left: Right Pain - part of body: Leg;Ankle and joints of foot                Time: 1130-1150 OT Time Calculation (min): 20 min Charges:  OT General Charges $OT Visit: 1 Visit OT Evaluation $OT Eval Moderate Complexity: 1 Mod  Warren SAUNDERS., MPH, MS, OTR/L ascom [phone] 04/23/23, 1:21 PM

## 2023-04-23 NOTE — Progress Notes (Signed)
 Pharmacy Antibiotic Note  Manuel [name] is a 77 y.o. male w/ PMH of OA, CAD, HTN, DDD, DM, gout, HLD, OSA, peripheral neuropathy admitted on 04/18/2023 with cellulitis.  Pharmacy has been consulted for vancomycin  dosing. Serum creatinine is elevated but improving since admission  Plan:  Adjust vancomycin  dose to 2000 mg IV every 24 hours based on current renal function (Scr 0.81) eAUC 519.1, Cmin 10.4, Scr 0.81, IBW used, Vd 0.72 L/kg Pharmacy will continue to follow and will adjust abx dosing as warranted. F/u renal fxn, cultures, length of tx, etc  Temp (24hrs), Avg:98.7 F (37.1 C), Min:98.1 F (36.7 C), Max:99.1 F (37.3 C)   Recent Labs  Lab 04/18/23 1758 04/18/23 1759 04/18/23 1759 04/19/23 0442 04/19/23 1745 04/20/23 0424 04/21/23 0431 04/21/23 1324 04/22/23 0456 04/23/23 0417  WBC  --  16.5*  --  15.6*  --   --   --  13.9* 11.3* 11.0*  CREATININE  --  1.98*   < > 1.71*   < > 1.19 0.84 0.89 0.98 0.81  LATICACIDVEN 1.1  --   --   --   --   --   --   --   --   --   VANCORANDOM  --   --   --   --   --  11  --   --   --   --    < > = values in this interval not displayed.    Estimated Creatinine Clearance: 78 mL/min (by C-G formula based on SCr of 0.81 mg/dL).    No Known Allergies  Antimicrobials this admission: 12/28 cefepime  >> x 1 dose 12/28 vancomycin  >>  12/28 ceftriaxone  >>   Microbiology results: 12/28 BCx: NGTD 12/28 WCx: moderate MRSA MRSA PCR negative  Thank you for involving pharmacy in this patient's care.   Liliana Dang A Yoav Okane, PharmD Clinical Pharmacist 04/23/2023 1:15 PM

## 2023-04-23 NOTE — Progress Notes (Signed)
 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  He then underwent R knee I&D for septic/gouty arthritis on 12/30.  He will need SNF rehab.  Vascular surgery is recommending ASA 81 mg daily + Plavix  and Lipitor .  ID is consulting for antibiotic recommendations.  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/29 Also with septic knee with gouty arthritis and so underwent R knee I&D on 12/30 Wound cultures with staph aureus, sensitivities pending Started empirically on IV vancomycin  and Ceftriaxone , may be able to narrow coverage soon Blood cultures NTD x 4 days He appears likely to need CIR - PT/OT consulted  Acute on chronic hyponatremia On admission 123, his ranges usually greater than 135 Improved with IV fluids, currently 131 Appears to be a chronic issue   Chronic systolic heart failure with a last EF of 30% in October 2025/ischemic cardiomyopathy Continue Coreg , Farxiga  Held Entresto , torsemide  and Aldactone  due to acute kidney injury Resume Entresto  and Aldactone  for now Consider restarting torsemide  soon if renal function remains stable   Acute metabolic encephalopathy Likely multifactorial in the setting of hypovolemia and infectious etiology Wife raises some concern for possible dementia Will add delirium precautions   Uncontrolled type 2 diabetes mellitus with hyperglycemia, with long-term current use of insulin   A1c was 6.9 in  10/2022 Continue NovoLog  70/30, 30 units twice a day, plus correction scale.   BPH Continue tamsulosin    Dyslipidemia Continue atorvastatin    PVD Continue ASA, Plavix    Essential hypertension Continue Coreg  Hold Entresto , torsemide  and Aldactone  in the setting of acute kidney injury Use hydralazine  IV as needed   Chronic pain Continue mirtazepine, Lyrica , Percocet  I have reviewed this patient in the [place] Controlled Substances Reporting System.  He is receiving medications from only one provider and appears to be taking them as prescribed. He is not at particularly high risk of opioid misuse, diversion, or overdose.        Consultants: Podiatry Vascular surgery Orthopedics ID   Procedures: R AKA 12/29   Antibiotics: Ceftriaxone  12/28- Vancomycin  12/28-   30 Day Unplanned Readmission Risk Score    Flowsheet Row ED to Hosp-Admission (Current) from 04/18/2023 in [facility] (1A)  30 Day Unplanned Readmission Risk Score (%) 42.4 Filed at 04/23/2023 0400       This score is the patient's risk of an unplanned readmission within 30 days of being discharged (0 -100%). The score is based on dignosis, age, lab data, medications, orders, and past utilization.   Low:  0-14.9   Medium: 15-21.9   High: 22-29.9   Extreme: 30 and above           Subjective: Complaining of random severe pains - LUQ (also happened after last surgery), RLQ as expected, diffuse abdominal pain.  Still with intermittent cognitive impairment.   Objective: Vitals:   04/23/23 0724 04/23/23 1142  BP: (!) 117/47 (!) 115/59  Pulse: 76 69  Resp: 18   Temp: 98.1 F (36.7 C)   SpO2: 97%     Intake/Output Summary (Last 24 hours) at 04/23/2023 1435 Last data filed at 04/23/2023 1000 Gross per 24 hour  Intake 580 ml  Output 750 ml  Net -170 ml   Filed Weights   04/18/23 1755  Weight: 83.9 kg    Exam:  General:  Appears fairly uncomfortable when awake Eyes:   EOMI, normal lids, iris ENT:  grossly normal hearing, lips & tongue, mmm Neck:  no LAD, masses or thyromegaly Cardiovascular:  RRR, no m/r/g. Respiratory:   CTA bilaterally with no wheezes/rales/rhonchi.  Normal respiratory effort. Abdomen:  soft, NT, ND; staple removed from R groin  Skin:  no rash or induration seen on limited exam Musculoskeletal: s/p R BKA Psychiatric:  somnolent mood and affect, speech sparse but appropriate Neurologic:  CN 2-12 grossly intact, moves all extremities in coordinated fashion  Data Reviewed: I have reviewed the patient's lab results since admission.  Pertinent labs for today include:   CO2 18 WBC 11 Hgb 8.5, stable Platelets 410     Family Communication: Wife was present throughout evaluation  Disposition: Status is: Inpatient Remains inpatient appropriate because: needs placement     Time spent: 50 minutes  Unresulted Labs (From admission, onward)     Start     Ordered   04/24/23 0500  CBC with Differential/Platelet  Tomorrow morning,   R       Question:  Specimen collection method  Answer:  Lab=Lab collect   04/23/23 1432   04/24/23 0500  Basic metabolic panel  Tomorrow morning,   R       Question:  Specimen collection method  Answer:  Lab=Lab collect   04/23/23 1432             Author: Delon Herald, MD 04/23/2023 2:35 PM  For on call review [email or web address].

## 2023-04-24 DIAGNOSIS — B9561 Methicillin susceptible Staphylococcus aureus infection as the cause of diseases classified elsewhere: Secondary | ICD-10-CM | POA: Diagnosis not present

## 2023-04-24 DIAGNOSIS — M00061 Staphylococcal arthritis, right knee: Secondary | ICD-10-CM | POA: Diagnosis not present

## 2023-04-24 DIAGNOSIS — E11628 Type 2 diabetes mellitus with other skin complications: Secondary | ICD-10-CM

## 2023-04-24 DIAGNOSIS — M009 Pyogenic arthritis, unspecified: Secondary | ICD-10-CM

## 2023-04-24 DIAGNOSIS — M86261 Subacute osteomyelitis, right tibia and fibula: Secondary | ICD-10-CM | POA: Diagnosis not present

## 2023-04-24 DIAGNOSIS — T8743 Infection of amputation stump, right lower extremity: Secondary | ICD-10-CM | POA: Diagnosis not present

## 2023-04-24 DIAGNOSIS — L089 Local infection of the skin and subcutaneous tissue, unspecified: Secondary | ICD-10-CM

## 2023-04-24 LAB — CULTURE, BLOOD (ROUTINE X 2)
Culture: NO GROWTH
Culture: NO GROWTH
Special Requests: ADEQUATE

## 2023-04-24 LAB — AEROBIC/ANAEROBIC CULTURE W GRAM STAIN (SURGICAL/DEEP WOUND): Gram Stain: NONE SEEN

## 2023-04-24 LAB — HEPATIC FUNCTION PANEL
ALT: 68 U/L — ABNORMAL HIGH (ref 0–44)
AST: 46 U/L — ABNORMAL HIGH (ref 15–41)
Albumin: 2.1 g/dL — ABNORMAL LOW (ref 3.5–5.0)
Alkaline Phosphatase: 103 U/L (ref 38–126)
Bilirubin, Direct: <0.1 mg/dL (ref 0.0–0.2)
Total Bilirubin: 0.8 mg/dL (ref 0.0–1.2)
Total Protein: 6 g/dL — ABNORMAL LOW (ref 6.5–8.1)

## 2023-04-24 LAB — CBC WITH DIFFERENTIAL/PLATELET
Abs Immature Granulocytes: 0.12 10*3/uL — ABNORMAL HIGH (ref 0.00–0.07)
Basophils Absolute: 0 10*3/uL (ref 0.0–0.1)
Basophils Relative: 0 %
Eosinophils Absolute: 0.2 10*3/uL (ref 0.0–0.5)
Eosinophils Relative: 2 %
HCT: 25.7 % — ABNORMAL LOW (ref 39.0–52.0)
Hemoglobin: 8.3 g/dL — ABNORMAL LOW (ref 13.0–17.0)
Immature Granulocytes: 1 %
Lymphocytes Relative: 26 %
Lymphs Abs: 2.8 10*3/uL (ref 0.7–4.0)
MCH: 27.7 pg (ref 26.0–34.0)
MCHC: 32.3 g/dL (ref 30.0–36.0)
MCV: 85.7 fL (ref 80.0–100.0)
Monocytes Absolute: 1 10*3/uL (ref 0.1–1.0)
Monocytes Relative: 9 %
Neutro Abs: 6.7 10*3/uL (ref 1.7–7.7)
Neutrophils Relative %: 62 %
Platelets: 440 10*3/uL — ABNORMAL HIGH (ref 150–400)
RBC: 3 MIL/uL — ABNORMAL LOW (ref 4.22–5.81)
RDW: 16.5 % — ABNORMAL HIGH (ref 11.5–15.5)
WBC: 10.8 10*3/uL — ABNORMAL HIGH (ref 4.0–10.5)
nRBC: 0 % (ref 0.0–0.2)

## 2023-04-24 LAB — BASIC METABOLIC PANEL
Anion gap: 7 (ref 5–15)
BUN: 29 mg/dL — ABNORMAL HIGH (ref 8–23)
CO2: 22 mmol/L (ref 22–32)
Calcium: 8.2 mg/dL — ABNORMAL LOW (ref 8.9–10.3)
Chloride: 104 mmol/L (ref 98–111)
Creatinine, Ser: 0.85 mg/dL (ref 0.61–1.24)
GFR, Estimated: >60 mL/min (ref >60)
Glucose, Bld: 156 mg/dL — ABNORMAL HIGH (ref 70–99)
Potassium: 4.3 mmol/L (ref 3.5–5.1)
Sodium: 133 mmol/L — ABNORMAL LOW (ref 135–145)

## 2023-04-24 LAB — GLUCOSE, CAPILLARY
Glucose-Capillary: 142 mg/dL — ABNORMAL HIGH (ref 70–99)
Glucose-Capillary: 132 mg/dL — ABNORMAL HIGH (ref 70–99)
Glucose-Capillary: 79 mg/dL (ref 70–99)
Glucose-Capillary: 134 mg/dL — ABNORMAL HIGH (ref 70–99)

## 2023-04-24 LAB — BODY FLUID CULTURE W GRAM STAIN: Gram Stain: NONE SEEN

## 2023-04-24 MED ORDER — MORPHINE SULFATE (PF) 2 MG/ML IV SOLN
2.0000 mg | INTRAVENOUS | Status: DC | PRN
  Administered 2023-04-26 – 2023-05-05 (×24): 2 mg via INTRAVENOUS
  Filled 2023-04-24 (×26): qty 1

## 2023-04-24 MED ORDER — NYSTATIN 100000 UNIT/ML MT SUSP
5.0000 mL | Freq: Four times a day (QID) | OROMUCOSAL | Status: DC
  Administered 2023-04-24 – 2023-05-08 (×45): 500000 [IU] via ORAL
  Filled 2023-04-24 (×59): qty 5

## 2023-04-24 MED ORDER — GUAIFENESIN ER 600 MG PO TB12
600.0000 mg | ORAL_TABLET | Freq: Two times a day (BID) | ORAL | Status: DC | PRN
  Administered 2023-04-24: 600 mg via ORAL
  Filled 2023-04-24: qty 1

## 2023-04-24 NOTE — Progress Notes (Signed)
 Subjective  - POD #4, s/p right BKA  Frustrated about overall situation Pain controlled   Physical Exam:  Dressing removed.  Incision is clean dry and intact.  No drainage.       Assessment/Plan:  POD #4  -Continue with PT, OT, rehab -Dry dressing to BKA stump daily -Antibiotics per ID  Manuel [name] 04/24/2023 10:11 AM --  Vitals:   04/24/23 0154 04/24/23 0752  BP: 120/61 115/71  Pulse: 72 73  Resp: 16 16  Temp: 97.9 F (36.6 C) 99.6 F (37.6 C)  SpO2: 100% 97%    Intake/Output Summary (Last 24 hours) at 04/24/2023 1011 Last data filed at 04/24/2023 0752 Gross per 24 hour  Intake 1136.33 ml  Output 1150 ml  Net -13.67 ml     Laboratory CBC    Component Value Date/Time   WBC 10.8 (H) 04/24/2023 0417   HGB 8.3 (L) 04/24/2023 0417   HGB 15.7 02/16/2012 2013   HCT 25.7 (L) 04/24/2023 0417   HCT 44.4 02/16/2012 2013   PLT 440 (H) 04/24/2023 0417   PLT 176 02/16/2012 2013    BMET    Component Value Date/Time   NA 133 (L) 04/24/2023 0417   NA 137 02/16/2012 2013   K 4.3 04/24/2023 0417   K 3.8 02/16/2012 2013   CL 104 04/24/2023 0417   CL 102 02/16/2012 2013   CO2 22 04/24/2023 0417   CO2 25 02/16/2012 2013   GLUCOSE 156 (H) 04/24/2023 0417   GLUCOSE 154 (H) 02/16/2012 2013   BUN 29 (H) 04/24/2023 0417   BUN 10 02/16/2012 2013   CREATININE 0.85 04/24/2023 0417   CREATININE 0.72 02/16/2012 2013   CALCIUM  8.2 (L) 04/24/2023 0417   CALCIUM  9.6 02/16/2012 2013   GFRNONAA >60 04/24/2023 0417   GFRNONAA >60 02/16/2012 2013   GFRAA >60 12/09/2018 0929   GFRAA >60 02/16/2012 2013    COAG Lab Results  Component Value Date   INR 1.4 (H) 04/19/2023   INR 1.3 (H) 01/15/2023   INR 1.1 11/10/2022   No results found for: PTT  Antibiotics Anti-infectives (From admission, onward)    Start     Dose/Rate Route Frequency Ordered Stop   04/24/23 0600  vancomycin  (VANCOREADY) IVPB 2000 mg/400 mL        2,000 mg 200 mL/hr over 120 Minutes  Intravenous Every 24 hours 04/23/23 1314 04/26/23 0559   04/23/23 0830  vancomycin  (VANCOREADY) IVPB 1500 mg/300 mL  Status:  Discontinued        1,500 mg 150 mL/hr over 120 Minutes Intravenous Every 24 hours 04/22/23 0957 04/23/23 1314   04/20/23 0830  vancomycin  (VANCOREADY) IVPB 1250 mg/250 mL  Status:  Discontinued        1,250 mg 166.7 mL/hr over 90 Minutes Intravenous Every 24 hours 04/20/23 0750 04/22/23 0957   04/20/23 0800  vancomycin  (VANCOCIN ) IVPB 1000 mg/200 mL premix  Status:  Discontinued        1,000 mg 200 mL/hr over 60 Minutes Intravenous Every 24 hours 04/19/23 1440 04/20/23 0750   04/19/23 1400  cefTRIAXone  (ROCEPHIN ) 2 g in sodium chloride  0.9 % 100 mL IVPB        2 g 200 mL/hr over 30 Minutes Intravenous Every 24 hours 04/19/23 0228 04/26/23 1359   04/19/23 0800  vancomycin  (VANCOREADY) IVPB 750 mg/150 mL  Status:  Discontinued        750 mg 150 mL/hr over 60 Minutes Intravenous Every 24 hours 04/19/23 0237 04/19/23  1440   04/19/23 0230  vancomycin  (VANCOCIN ) IVPB 1000 mg/200 mL premix  Status:  Discontinued        1,000 mg 200 mL/hr over 60 Minutes Intravenous  Once 04/19/23 0228 04/19/23 0230   04/19/23 0130  ceFEPIme  (MAXIPIME ) 2 g in sodium chloride  0.9 % 100 mL IVPB        2 g 200 mL/hr over 30 Minutes Intravenous  Once 04/19/23 0121 04/19/23 0240   04/19/23 0130  Vancomycin  (VANCOCIN ) 1,500 mg in sodium chloride  0.9 % 500 mL IVPB        1,500 mg 250 mL/hr over 120 Minutes Intravenous  Once 04/19/23 0121 04/19/23 0438        V. Malvina Serene CLORE, M.D., [facility] [place] Office: [phone] Pager:  [phone]

## 2023-04-24 NOTE — Plan of Care (Signed)
   Problem: Respiratory: Goal: Ability to maintain adequate ventilation will improve Outcome: Progressing

## 2023-04-24 NOTE — Progress Notes (Signed)
 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  He then underwent R knee I&D for septic/gouty arthritis on 12/30.  He will need SNF rehab.  Vascular surgery is recommending ASA 81 mg daily + Plavix  and Lipitor .  ID is consulting for antibiotic recommendations.  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/29 Also with septic knee with gouty arthritis and so underwent R knee I&D on 12/30; knee with MSSA so will stop colchicine  for now (lots of interactions) and resume if needed Wound cultures with staph aureus, MSSA with resistance to Cipro , Clinda, Erythromycin, Tetracycline Started empirically on IV vancomycin  and Ceftriaxone , may be able to narrow coverage soon  Blood cultures negative x 5 days He appears likely to need CIR - PT/OT consulted   Acute on chronic hyponatremia On admission 123, his ranges usually greater than 135 Improved with IV fluids, currently 133 Appears to be a chronic issue, stable   Chronic systolic heart failure with a last EF of 30% in October 2025/ischemic cardiomyopathy Continue Coreg , Farxiga  Held Entresto , torsemide  and Aldactone  due to acute kidney injury Resume Entresto  and Aldactone  for now Consider restarting torsemide  soon    Acute metabolic encephalopathy Likely multifactorial in the setting of hypovolemia and infectious etiology Wife raises some concern for possible dementia Added delirium precautions    Uncontrolled type 2 diabetes mellitus with hyperglycemia, with long-term current use of insulin   A1c was 6.9 in 10/2022 Continue NovoLog  70/30, 30 units twice a day, plus correction scale.   BPH Continue tamsulosin    Dyslipidemia Continue atorvastatin    PVD Continue ASA, Plavix    Essential hypertension Continue Coreg  Hold Entresto , torsemide  and Aldactone  in the setting of acute kidney injury Use hydralazine  IV as needed   Chronic pain Continue mirtazepine, Lyrica , Percocet  I have reviewed this patient in the [place] Controlled Substances Reporting System.  He is receiving medications from only one provider and appears to be taking them as prescribed. He is not at particularly high risk of opioid misuse, diversion, or overdose.        Consultants: Podiatry Vascular surgery Orthopedics ID   Procedures: R AKA 12/29   Antibiotics: Ceftriaxone  12/28- Vancomycin  12/28-  30 Day Unplanned Readmission Risk Score    Flowsheet Row ED to Hosp-Admission (Current) from 04/18/2023 in [facility] (1A)  30 Day Unplanned Readmission Risk Score (%) 43.13 Filed at 04/24/2023 0400       This score is the patient's risk of an unplanned readmission within 30 days of being discharged (0 -100%). The score is based on dignosis, age, lab data, medications, orders, and past utilization.   Low:  0-14.9   Medium: 15-21.9   High: 22-29.9   Extreme: 30 and above           Subjective: Feeling better, more clear today.  He is recommended for CIR.   Objective: Vitals:   04/24/23 0154 04/24/23 9247  BP: 120/61 115/71  Pulse: 72 73  Resp: 16 16  Temp: 97.9 F (36.6 C) 99.6 F (37.6 C)  SpO2: 100% 97%    Intake/Output Summary (Last 24 hours) at 04/24/2023 1351 Last data filed at 04/24/2023 1105 Gross per 24 hour  Intake 1136.33 ml  Output 1900 ml  Net -763.67 ml   Filed Weights   04/18/23 1755  Weight: 83.9 kg    Exam:  General:  More alert and  interactive today Eyes:  EOMI, normal lids, iris ENT:  grossly normal hearing, lips & tongue, mmm Neck:  no LAD, masses or thyromegaly Cardiovascular:  RRR, no m/r/g. Respiratory:   CTA bilaterally with no wheezes/rales/rhonchi.  Normal respiratory effort. Abdomen:  soft, NT, ND Skin:  no rash or induration seen on limited exam Musculoskeletal: s/p R BKA Psychiatric:  pleasant mood and affect, speech appropriate Neurologic:  CN 2-12 grossly intact, moves all extremities in coordinated fashion  Data Reviewed: I have reviewed the patient's lab results since admission.  Pertinent labs for today include:   Unremarkable BMP WBC 10.8 Hgb 8.3, 8.5 on 1/1 Platelets 440 Wound culture staph aureus, sensitivities pending Blood cultures NTD x 4 days     Family Communication: None present today  Disposition: Status is: Inpatient Remains inpatient appropriate because: awaiting CIR     Time spent: 50 minutes  Unresulted Labs (From admission, onward)     Start     Ordered   04/25/23 0500  CBC with Differential/Platelet  Tomorrow morning,   R       Question:  Specimen collection method  Answer:  Lab=Lab collect   04/24/23 1351   04/25/23 0500  Basic metabolic panel  Tomorrow morning,   R       Question:  Specimen collection method  Answer:  Lab=Lab collect   04/24/23 1351             Author: Delon Herald, MD 04/24/2023 1:51 PM  For on call review [email or web address].

## 2023-04-24 NOTE — Progress Notes (Signed)
 Inpatient Rehab Admissions Coordinator Note:   Per updated therapy recommendations patient was screened for CIR candidacy by Reche FORBES Lowers, PT. At this time, pt appears to be a potential candidate for CIR. I will place an order for rehab consult for full assessment, per our protocol.  Please contact me any with questions.SABRA Reche Lowers, PT, DPT [phone] 04/24/23 10:07 AM

## 2023-04-24 NOTE — Plan of Care (Signed)

## 2023-04-24 NOTE — Progress Notes (Signed)
 Subjective: 3 Days Post-Op Procedure(s) (LRB): IRRIGATION AND DEBRIDEMENT KNEE (Right) Patient s/p right BKA performed on 04/20/23 by vascular surgery. When asked about pain, he states It's too early to tell Patient is doing well, no acute complaints this morning.  Reports moderate pain in the right knee but does not feel that it is worsening at this time. PT and care management to assist with discharge planning. Negative for chest pain and shortness of breath Fever:  99.6 temp this morning. Denies any feelings of fever or chills Gastrointestinal:Negative for nausea and vomiting  Objective: Vital signs in last 24 hours: Temp:  [97.9 F (36.6 C)-99.6 F (37.6 C)] 99.6 F (37.6 C) (01/02 0752) Pulse Rate:  [69-73] 73 (01/02 0752) Resp:  [16-18] 16 (01/02 0752) BP: (115-132)/(59-71) 115/71 (01/02 0752) SpO2:  [94 %-100 %] 97 % (01/02 0752)  Intake/Output from previous day:  Intake/Output Summary (Last 24 hours) at 04/24/2023 0829 Last data filed at 04/24/2023 0752 Gross per 24 hour  Intake 1496.33 ml  Output 1150 ml  Net 346.33 ml    Intake/Output this shift: Total I/O In: 397.7 [IV Piggyback:397.7] Out: -   Labs: Recent Labs    04/21/23 1324 04/22/23 0456 04/23/23 0417 04/24/23 0417  HGB 8.1* 8.2* 8.5* 8.3*   Recent Labs    04/23/23 0417 04/24/23 0417  WBC 11.0* 10.8*  RBC 2.96* 3.00*  HCT 25.1* 25.7*  PLT 410* 440*   Recent Labs    04/23/23 0417 04/24/23 0417  NA 134* 133*  K 4.7 4.3  CL 104 104  CO2 18* 22  BUN 31* 29*  CREATININE 0.81 0.85  GLUCOSE 100* 156*  CALCIUM  8.4* 8.2*   No results for input(s): LABPT, INR in the last 72 hours.  EXAM General - Patient is Alert and Appropriate Extremity - Dressing covering the right BKA site, ioban covering part of the ACE wrap applied to the right knee. ACE wrap left in place this morning, no purulent or bloody drainage noted. Tolerated palpation around the right knee with mild pain this morning.  Able to sit to side of bed with provider and nursing assistance Abdomen soft to palpation.  Past Medical History:  Diagnosis Date   Acute ST elevation myocardial infarction (STEMI) of inferior wall (HCC) 04/19/2010   a.) transfered from [facility] to [facility] --> LHC/PCI (very difficult procedure) --> 3.0 x 23 mm and 3.0 x 12 mm Xience stents to RCA   Allergies    Arthritis    Benign essential hypertension    Bilateral carotid artery disease (HCC) 05/08/2021   a.) carotid doppler 05/08/2021: 1-39% BICA   CAD (coronary artery disease) 04/19/2010   a.) inferior STEMI 04/19/2010 --> LHC/PCI: 50-70% pD1, 80% pRI, 90/90/90% RCA (overlapping 3.0 x 23 and 3.0 x 12 mm Xience DES); b.) MV 11/10/2018: fixed minimally reversible inferior perfusion defect   Cellulitis of foot    DDD (degenerative disc disease), cervical    Diabetes mellitus type 2, insulin  dependent (HCC)    Diverticulosis    Full dentures    Gout    Hard of hearing    History of bilateral cataract extraction 2022   History of ETOH abuse    Hyperlipidemia    Ischemic cardiomyopathy 04/19/2010   a.) TTE 04/19/2010: 40%; b.) TTE 04/20/2014: EF >55%, mild RVE, triv PR, mild MR/TR, G1DD; c.) TTE 11/10/2018: EF 45%, inf HK, mild RVE, triv TR/PR, mild MR, G1DD; d.) TTE 11/14/2022: EF 25-30%, basal anteroseptal, apical lateral, apical septal, and apex  AK, mid anterolateral HK, mild-mod MR/TR   Long term current use of aspirin     Long term current use of clopidogrel     Lumbar degenerative disc disease    Lumbar radiculopathy    Lumbar vertebral fracture (chronic superior endplate of L1)    OSA (obstructive sleep apnea)    a.) unable to tolerate nocturnal PAP therapy   Peripheral artery disease (HCC)    a.) stenting 05/14/21: 12 mm x 12 cm LifeStent RIGHT dis SFA/prox pop; b.) s/p cath directed thrombolysis RIGHT SFA/pop 06/14/21; c.) s/p mech thrombectomy + stenting 06/15/21: 8 mm x 25cm & 8 mm x 7.5cm Viabahn; d.) s/p BILAT CFA, profunda femoris,  SFA endarterectomies + fogarty embolectomy + stenting 11/15/22: 12mm x 58mm Lifestream BILAT CIAs, 14 mm x 6 cm Lifestream & 13 mm x 5 cm Viabahn LEFT EIA   Peripheral neuropathy    Umbilical hernia    Assessment/Plan: 3 Days Post-Op Procedure(s) (LRB): IRRIGATION AND DEBRIDEMENT KNEE (Right) Principal Problem:   Osteomyelitis (HCC) Active Problems:   Hypertension   Coronary artery disease with history of myocardial infarction without history of CABG   Diabetes mellitus type 2, insulin  dependent (HCC)   Mixed hyperlipidemia   Lower limb ischemia right leg with history of angioplasty and stent on 05/14/2021   Chronic combined systolic and diastolic CHF (congestive heart failure) (HCC)   Acute kidney injury superimposed on chronic kidney disease (HCC)   Hyponatremia   Uncontrolled type 2 diabetes mellitus with hyperglycemia, with long-term current use of insulin  (HCC)   Essential hypertension   Dyslipidemia   BPH (benign prostatic hyperplasia)   Acute metabolic encephalopathy  Estimated body mass index is 28.12 kg/m as calculated from the following:   Height as of this encounter: 5' 8 (1.727 m).   Weight as of this encounter: 83.9 kg. Up with therapy as tolerated.  Low grade fevers recently, temp 99.6 this morning.  WBC 10.6 this AM. Hemoglobin at 8.3 Cultures from surgery still pending, gram stain with Moderate gram + cocci Continue on Rocephin  and Vacomycin per infectious disease. WIll defer to vascular for wound changes to the right BKA. Per Locums, staples to be removed in 2-3 weeks.  DVT Prophylaxis - Lovenox  and Plavix  NWB to right lower extremity.  Fonda CHARLENA Koyanagi, PA-C [facility] Orthopaedic Surgery 04/24/2023, 8:29 AM

## 2023-04-24 NOTE — Progress Notes (Signed)
 Date of Admission:  04/18/2023  ROME [name] is a 78 y.o. 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, followed by TMA in oct 2024 presented with infected TMA site Pt has complicated PAD history and foot infection    he had endarterectomies of both rt and left common, profunda and superficial femoral artery, fogary embolectomy of rt SFA, stent placement in b/l common iliac arteries, left external iliac and Rt PTA, on 11/15/22  Had wound dehiscence on the rt groin  , he underwent debridement of the wound on the rt groin on 01/09/23 with excision of 15 cm2 of skin, soft tissue, muscle fascia and application of neg pressure vac He had serratia , staph aureus and ecoli in culture and received Iv antibiotic followed by PO cipro + Augmentin  He also underwent TMA on 02/17/23 He followed with Podiatrist as OP On 12/24 he came to the ED with rt knee and leg pain after he fell out of bed. The TMA site was red , weeping and and he was started on keflex  and bactrim  and discharged from ED. Xray was done in the ED on 12/14 and it did not show any fracture.   He returned tot he ED with rt leg swelling upto thigh , pain and confusion   ID: Manuel [name] is a 78 y.o. male  Principal Problem:   Osteomyelitis (HCC) Active Problems:   Hypertension   Coronary artery disease with history of myocardial infarction without history of CABG   Diabetes mellitus type 2, insulin  dependent (HCC)   Mixed hyperlipidemia   Lower limb ischemia right leg with history of angioplasty and stent on 05/14/2021   Chronic combined systolic and diastolic CHF (congestive heart failure) (HCC)   Acute kidney injury superimposed on chronic kidney disease (HCC)   Hyponatremia   Uncontrolled type 2 diabetes mellitus with hyperglycemia, with long-term current use of insulin  (HCC)   Essential hypertension   Dyslipidemia   BPH (benign prostatic hyperplasia)   Acute  metabolic encephalopathy    Subjective: C/o left sided abdomina plain Says this come s and goes and now it is sharp like trapped gas No pain at the stump  Medications:   aspirin  EC  81 mg Oral Daily   atorvastatin   80 mg Oral QHS   carvedilol   25 mg Oral BID WC   clopidogrel   75 mg Oral Daily   colchicine   0.6 mg Oral BID   dapagliflozin  propanediol  10 mg Oral Daily   enoxaparin  (LOVENOX ) injection  40 mg Subcutaneous Q24H   folic acid   1 mg Oral Daily   insulin  aspart  0-20 Units Subcutaneous TID WC   insulin  aspart  0-5 Units Subcutaneous QHS   insulin  aspart protamine - aspart  30 Units Subcutaneous BID WC   iron  polysaccharides  150 mg Oral Daily   mirtazapine   15 mg Oral QHS   multivitamin with minerals  1 tablet Oral Daily   mupirocin  ointment  1 Application Nasal BID   pregabalin   75 mg Oral BID   Ensure Max Protein  11 oz Oral Daily   sacubitril -valsartan   1 tablet Oral Q12H   spironolactone   50 mg Oral Daily   tamsulosin   0.4 mg Oral QPC supper   thiamine   100 mg Oral Daily    Objective: Vital signs in last 24 hours: Patient Vitals for the past 24 hrs:  BP Temp Temp src Pulse Resp  SpO2  04/24/23 0752 115/71 99.6 F (37.6 C) -- 73 16 97 %  04/24/23 0154 120/61 97.9 F (36.6 C) -- 72 16 100 %  04/23/23 2145 127/63 98.4 F (36.9 C) -- 73 18 96 %  04/23/23 1639 132/63 98.8 F (37.1 C) Oral 69 18 94 %  04/23/23 1142 (!) 115/59 -- -- 69 -- --      PHYSICAL EXAM:  General: Alert, no distress,  Neck: Supple, symmetrical, no adenopathy, thyroid: non tender no carotid bruit and no JVD. Lungs:b/l air entry Heart: s1s2 Abdomen: Soft, tendernes sleft side of abdomen Extremities: rt BKA- surgical dressing Skin: No rashes or lesions. Or bruising Lymph: Cervical, supraclavicular normal. Neurologic: Grossly non-focal  Lab Results    Latest Ref Rng & Units 04/24/2023    4:17 AM 04/23/2023    4:17 AM 04/22/2023    4:56 AM  CBC  WBC 4.0 - 10.5 K/uL 10.8  11.0   11.3   Hemoglobin 13.0 - 17.0 g/dL 8.3  8.5  8.2   Hematocrit 39.0 - 52.0 % 25.7  25.1  24.0   Platelets 150 - 400 K/uL 440  410  366        Latest Ref Rng & Units 04/24/2023    4:17 AM 04/23/2023    4:17 AM 04/22/2023    4:56 AM  CMP  Glucose 70 - 99 mg/dL 843  899  854   BUN 8 - 23 mg/dL 29  31  35   Creatinine 0.61 - 1.24 mg/dL 9.14  9.18  9.01   Sodium 135 - 145 mmol/L 133  134  131   Potassium 3.5 - 5.1 mmol/L 4.3  4.7  5.2   Chloride 98 - 111 mmol/L 104  104  104   CO2 22 - 32 mmol/L 22  18  20    Calcium  8.9 - 10.3 mg/dL 8.2  8.4  8.1       Microbiology: Synovial fluid- no organisms seen ( was previously reported as gram positive cocci- spoke to lab Culture neg so far    Assessment/Plan:  ?Rt TMA site infection- underwent BKA Wound culture was MSSA   Rt knee septic arthritis- s/p I/D culture pending  On ceftriaxone  and vanco- - culture no growth sofar- continue both antibiotics by IV for a minimum of 2 weeks and then can switch to PO    DM- management as per primary team   PAD-- h/o endarterectomies of both rt and left common, profunda and superficial femoral artery, fogary embolectomy of rt SFA, stent placement in b/l common iliac arteries, left external iliac and Rt PTA, on 11/15/22    AKI- resolved   Anemia   Intermittent left sided abdominal pain- if it persist may consider CT abdomen In 2023 Ct  showed colonic diverticulosis, saccular aneursym of 2.3 cm and superior end plate fracture of l1  Discussed the management with patient, wife and hospitalist

## 2023-04-25 DIAGNOSIS — L089 Local infection of the skin and subcutaneous tissue, unspecified: Secondary | ICD-10-CM

## 2023-04-25 DIAGNOSIS — M86261 Subacute osteomyelitis, right tibia and fibula: Secondary | ICD-10-CM | POA: Diagnosis not present

## 2023-04-25 DIAGNOSIS — T8743 Infection of amputation stump, right lower extremity: Secondary | ICD-10-CM | POA: Diagnosis not present

## 2023-04-25 DIAGNOSIS — B9561 Methicillin susceptible Staphylococcus aureus infection as the cause of diseases classified elsewhere: Secondary | ICD-10-CM | POA: Diagnosis not present

## 2023-04-25 DIAGNOSIS — M00061 Staphylococcal arthritis, right knee: Secondary | ICD-10-CM | POA: Diagnosis not present

## 2023-04-25 LAB — CBC WITH DIFFERENTIAL/PLATELET
Abs Immature Granulocytes: 0.08 10*3/uL — ABNORMAL HIGH (ref 0.00–0.07)
Basophils Absolute: 0.1 10*3/uL (ref 0.0–0.1)
Basophils Relative: 1 %
Eosinophils Absolute: 0.2 10*3/uL (ref 0.0–0.5)
Eosinophils Relative: 2 %
HCT: 25.6 % — ABNORMAL LOW (ref 39.0–52.0)
Hemoglobin: 8.3 g/dL — ABNORMAL LOW (ref 13.0–17.0)
Immature Granulocytes: 1 %
Lymphocytes Relative: 19 %
Lymphs Abs: 2.3 10*3/uL (ref 0.7–4.0)
MCH: 27.9 pg (ref 26.0–34.0)
MCHC: 32.4 g/dL (ref 30.0–36.0)
MCV: 85.9 fL (ref 80.0–100.0)
Monocytes Absolute: 0.9 10*3/uL (ref 0.1–1.0)
Monocytes Relative: 7 %
Neutro Abs: 8.5 10*3/uL — ABNORMAL HIGH (ref 1.7–7.7)
Neutrophils Relative %: 70 %
Platelets: 468 10*3/uL — ABNORMAL HIGH (ref 150–400)
RBC: 2.98 MIL/uL — ABNORMAL LOW (ref 4.22–5.81)
RDW: 16.5 % — ABNORMAL HIGH (ref 11.5–15.5)
WBC: 11.9 10*3/uL — ABNORMAL HIGH (ref 4.0–10.5)
nRBC: 0 % (ref 0.0–0.2)

## 2023-04-25 LAB — BASIC METABOLIC PANEL
Anion gap: 11 (ref 5–15)
BUN: 26 mg/dL — ABNORMAL HIGH (ref 8–23)
CO2: 18 mmol/L — ABNORMAL LOW (ref 22–32)
Calcium: 8.2 mg/dL — ABNORMAL LOW (ref 8.9–10.3)
Chloride: 105 mmol/L (ref 98–111)
Creatinine, Ser: 0.83 mg/dL (ref 0.61–1.24)
GFR, Estimated: >60 mL/min (ref >60)
Glucose, Bld: 106 mg/dL — ABNORMAL HIGH (ref 70–99)
Potassium: 4.1 mmol/L (ref 3.5–5.1)
Sodium: 134 mmol/L — ABNORMAL LOW (ref 135–145)

## 2023-04-25 LAB — GLUCOSE, CAPILLARY
Glucose-Capillary: 105 mg/dL — ABNORMAL HIGH (ref 70–99)
Glucose-Capillary: 155 mg/dL — ABNORMAL HIGH (ref 70–99)
Glucose-Capillary: 119 mg/dL — ABNORMAL HIGH (ref 70–99)
Glucose-Capillary: 156 mg/dL — ABNORMAL HIGH (ref 70–99)

## 2023-04-25 MED ORDER — SODIUM CHLORIDE 0.9 % IV SOLN
2.0000 g | INTRAVENOUS | Status: AC
Start: 2023-04-26 — End: 2023-05-06
  Administered 2023-04-26 – 2023-05-06 (×11): 2 g via INTRAVENOUS
  Filled 2023-04-25 (×11): qty 20

## 2023-04-25 MED ORDER — VANCOMYCIN HCL 2000 MG/400ML IV SOLN
2000.0000 mg | INTRAVENOUS | Status: DC
  Administered 2023-04-26 – 2023-05-02 (×7): 2000 mg via INTRAVENOUS
  Filled 2023-04-25 (×8): qty 400

## 2023-04-25 NOTE — Progress Notes (Signed)
 Inpatient Rehab Admissions Coordinator:    I spoke with pt. And wife over the phone regarding potential CIR admit. They are interested and wife can provide 24/7 min A. I will send case to insurance and pursue for admit.   Leita Kleine, MS, CCC-SLP Rehab Admissions Coordinator  [phone] (celll) [phone] (office)

## 2023-04-25 NOTE — Progress Notes (Signed)
 Physical Therapy Treatment Patient Details Name: Manuel [name] MRN: [id number] DOB: December 13, 1945 Today's Date: 04/25/2023   History of Present Illness Pt is a 78 y.o. male presenting to hospital 04/19/23 with c/o AMS, weakness, fall, and R leg swelling/pain.  Recently seen in ED for fall onto R knee causing effusion and pain; pt s/p R foot TMA 2 months prior.  Pt admitted with sepsis d/t cellulitis, AKI superimposed on CKD, hyponatremia, acute metabolic encephalopathy.  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  S/p R knee aspiration 04/20/23. S/p R knee joint I&D 04/21/23 d/t R septic knee.  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    PT Comments  Pt received in bed, wife at bedside who states she has been assisting pt with amputee HEP, proper positioning, and encouraging EOB for meals. Pt is much more alert and oriented each day. Focused session on transfers bed>chair>BSC>chair with RW and MaxA to stand from surface each time. Pt able to maintain upright standing this date with improved transfer technique. Pt remains an excellent candidate for CIR, currently awaiting full consult. Will continue to progress acutely until hopeful transition to CIR.   If plan is discharge home, recommend the following: Two people to help with walking and/or transfers;Two people to help with bathing/dressing/bathroom;Assistance with cooking/housework;Direct supervision/assist for medications management;Direct supervision/assist for financial management;Assist for transportation;Help with stairs or ramp for entrance;Supervision due to cognitive status   Can travel by private vehicle     No  Equipment Recommendations  Other (comment) (TBD at next level of care)    Recommendations for Other Services       Precautions / Restrictions Precautions Precautions: Fall Restrictions Weight Bearing Restrictions Per Provider Order:  Yes RLE Weight Bearing Per Provider Order: Non weight bearing Other Position/Activity Restrictions: R BKA     Mobility  Bed Mobility Overal bed mobility: Needs Assistance Bed Mobility: Supine to Sit Rolling: Mod assist   Supine to sit: Max assist     General bed mobility comments: VC for sequencing    Transfers Overall transfer level: Needs assistance Equipment used: Rolling walker (2 wheels) Transfers: Sit to/from Stand, Bed to chair/wheelchair/BSC Sit to Stand: Max assist Stand pivot transfers: Mod assist         General transfer comment: Improved upright posture in standing. L shoe applied for increased stability    Ambulation/Gait               General Gait Details: Pt pivoted Left foot to bedside chair with RW and ModA, unable to hop. Multimodal cues for safe technique   Stairs             Wheelchair Mobility     Tilt Bed    Modified Rankin (Stroke Patients Only)       Balance Overall balance assessment: Needs assistance Sitting-balance support: No upper extremity supported, Feet supported Sitting balance-Leahy Scale: Good Sitting balance - Comments: Pt sat EOB for several minutes with SBA   Standing balance support: Reliant on assistive device for balance, During functional activity, Bilateral upper extremity supported Standing balance-Leahy Scale: Poor Standing balance comment: heavy BUE support on RW in standing for balance                            Cognition Arousal: Alert Behavior During Therapy: WFL for tasks assessed/performed Overall Cognitive Status: Within Functional Limits for tasks assessed  General Comments: Alertness improving, pt able to remain fully engaged in session.        Exercises Amputee Exercises Quad Sets: AROM, Right, 10 reps, Seated Knee Flexion: AAROM, Right, 10 reps, Seated Knee Extension: AROM, Right, 10 reps, Seated Other Exercises Other  Exercises: Pt and wife compliant with HEP and proper positioning when not participating in PT    General Comments General comments (skin integrity, edema, etc.): R stump dressing intact, no visual drainage. Pt states he's having some loose stool, ? from Abx.      Pertinent Vitals/Pain Pain Assessment Pain Assessment: No/denies pain    Home Living                          Prior Function            PT Goals (current goals can now be found in the care plan section) Acute Rehab PT Goals Patient Stated Goal: to feel better and have less pain Progress towards PT goals: Progressing toward goals    Frequency    Min 1X/week      PT Plan      Co-evaluation              AM-PAC PT 6 Clicks Mobility   Outcome Measure  Help needed turning from your back to your side while in a flat bed without using bedrails?: A Lot Help needed moving from lying on your back to sitting on the side of a flat bed without using bedrails?: A Lot Help needed moving to and from a bed to a chair (including a wheelchair)?: A Lot Help needed standing up from a chair using your arms (e.g., wheelchair or bedside chair)?: A Lot Help needed to walk in hospital room?: Total Help needed climbing 3-5 steps with a railing? : Total 6 Click Score: 10    End of Session Equipment Utilized During Treatment: Gait belt Activity Tolerance: Patient tolerated treatment well Patient left: in chair;with call bell/phone within reach;with chair alarm set;with family/visitor present Nurse Communication: Mobility status;Precautions;Other (comment) (BM) PT Visit Diagnosis: Other abnormalities of gait and mobility (R26.89);Muscle weakness (generalized) (M62.81);History of falling (Z91.81);Pain Pain - Right/Left: Right Pain - part of body: Knee;Leg     Time: 8871-8789 PT Time Calculation (min) (ACUTE ONLY): 42 min  Charges:    $Therapeutic Exercise: 8-22 mins $Therapeutic Activity: 23-37 mins PT  General Charges $$ ACUTE PT VISIT: 1 Visit                    Darice Bohr, PTA  Darice JAYSON Bohr 04/25/2023, 12:36 PM

## 2023-04-25 NOTE — Plan of Care (Signed)

## 2023-04-25 NOTE — Progress Notes (Signed)
 Physical Medicine & Rehabilitation Consult Service  Pt discussed with rehab admissions coordinator. Chart has been reviewed. 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 Postop he developed pain in the right knee and underwent I&D of the knee for septic/gouty arthritis. Pt placed on vanc and ceftriaxone  for wound coverage. Pt also has chronic hyponatremia, csCHF with EF of 30%. Course complicated further by metabolic encephalopathy and AKI. Pt was up with PT on 04/23/23 and was min assist for sit-std and for shuffling a few feet with RW to bedside chair     Assessment: S/p Right BKA for osteomyelitis/PAD complicated by right knee infection Metabolic encephalopathy Chronic diastolic CHF Post-op pain and wound care Uncontrolled Diabetes   Plan:  This patient would benefit from acute inpatient rehab to address w/c level mobility (some gait goals) so that he can reach nearly a modified independent level. Additionally, the patient requires daily MD oversight of the active medical issues noted above. Dispo and social supports are appropriate.    Rehab Admissions Coordinator to follow up.    Arthea IVAR Gunther, MD, [facility] Physical Medicine & Rehabilitation Medical Director Rehabilitation Services 04/25/2023

## 2023-04-25 NOTE — Progress Notes (Signed)
 Progress Note   Patient: Manuel [name] FMW:[id number] DOB: 03-Oct-1945 DOA: 04/18/2023     6 DOS: the patient was seen and examined on 04/25/2023   Brief hospital course: 78yo with h/o CAD, HTN, DM, and chronic systolic CHF who presented on 87/72 with AMS and wound infection s/p recent I&D of R groin wound and prior R 4th/5th partial ray amputation (7/29).  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  He then underwent R knee I&D for septic/gouty arthritis on 12/30.  He will need SNF rehab.  Vascular surgery is recommending ASA 81 mg daily + Plavix  and Lipitor .  ID is consulting for antibiotic recommendations.  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/29 Also with septic knee with gouty arthritis and so underwent R knee I&D on 12/30; knee with MSSA so will stop colchicine  for now (lots of interactions) and resume if needed Wound cultures with staph aureus, MSSA with resistance to Cipro , Clinda, Erythromycin, Tetracycline Started empirically on IV vancomycin  and Ceftriaxone ; will need IV abx for at least 2 weeks and then PO due to septic arthritis, per ID Blood cultures negative x 5 days He appears likely to need CIR and PT/OT/PM&R MD are in agreement Awaiting bed availability  Intermittent L side pain Wife reports that this is a chronic issue but seems to worsen after surgeries There is no obvious pattern to the pain Continue to monitor without intervention for now but consider imaging should pain worsen or become more consistent   Acute on chronic hyponatremia Appears to be a chronic issue, stable   Chronic systolic heart failure with a last EF of 30% in October 2025/ischemic cardiomyopathy Continue Coreg , Farxiga  Held Entresto , torsemide  and Aldactone  due to acute kidney injury Resume  Entresto  and Aldactone  for now Consider restarting torsemide  soon    Acute metabolic encephalopathy Likely multifactorial in the setting of hypovolemia and infectious etiology Wife raises some concern for possible dementia Added delirium precautions   Uncontrolled type 2 diabetes mellitus with hyperglycemia, with long-term current use of insulin   A1c was 6.9 in 10/2022 Continue NovoLog  70/30, 30 units twice a day, plus correction scale.   BPH Continue tamsulosin    Dyslipidemia Continue atorvastatin    PVD Continue ASA, Plavix    Essential hypertension Continue Coreg  Hold Entresto , torsemide  and Aldactone  in the setting of acute kidney injury Use hydralazine  IV as needed   Chronic pain Continue mirtazepine, Lyrica , Percocet  I have reviewed this patient in the [place] Controlled Substances Reporting System.  He is receiving medications from only one provider and appears to be taking them as prescribed. He is not at particularly high risk of opioid misuse, diversion, or overdose.        Consultants: Podiatry Vascular surgery Orthopedics ID PT OT TOC team   Procedures: R AKA 12/29   Antibiotics: Ceftriaxone  12/28-1/11 and then change to PO Vancomycin  12/28-1/11 and then change to PO  30 Day Unplanned Readmission Risk Score    Flowsheet Row ED to Hosp-Admission (Current) from 04/18/2023 in [facility] (1A)  30 Day Unplanned Readmission Risk Score (%) 43.56 Filed at 04/25/2023 0401       This score is the patient's risk of an unplanned readmission within 30 days of being discharged (0 -100%). The score is based on dignosis, age,  lab data, medications, orders, and past utilization.   Low:  0-14.9   Medium: 15-21.9   High: 22-29.9   Extreme: 30 and above           Subjective: Feeling ok today - leg pain controlled and LLQ pain not present at the time of my evaluation   Objective: Vitals:   04/24/23 0752 04/24/23 2210  BP: 115/71  130/64  Pulse: 73 68  Resp: 16 20  Temp: 99.6 F (37.6 C) 98.3 F (36.8 C)  SpO2: 97% 99%    Intake/Output Summary (Last 24 hours) at 04/25/2023 0735 Last data filed at 04/25/2023 9295 Gross per 24 hour  Intake 1176.28 ml  Output 1300 ml  Net -123.72 ml   Filed Weights   04/18/23 1755  Weight: 83.9 kg    Exam:  General:  More alert and interactive today Eyes:  EOMI, normal lids, iris ENT:  grossly normal hearing, lips & tongue, mmm Neck:  no LAD, masses or thyromegaly Cardiovascular:  RRR, no m/r/g. Respiratory:   CTA bilaterally with no wheezes/rales/rhonchi.  Normal respiratory effort. Abdomen:  soft, NT, ND Skin:  no rash or induration seen on limited exam Musculoskeletal: s/p R BKA Psychiatric:  pleasant mood and affect, speech appropriate Neurologic:  CN 2-12 grossly intact, moves all extremities in coordinated fashion  Data Reviewed: I have reviewed the patient's lab results since admission.  Pertinent labs for today include:  CO2 18 WBC 11.9 Hgb 8.3 Platelets 468    Family Communication: Wife was present throughout evaluation  Disposition: Status is: Inpatient Remains inpatient appropriate because: awaiting CIR     Time spent: 50 minutes  Unresulted Labs (From admission, onward)    None        Author: Delon Herald, MD 04/25/2023 7:35 AM  For on call review [email or web address].

## 2023-04-25 NOTE — Progress Notes (Signed)
 Date of Admission:  04/18/2023  Manuel [name] is a 78 y.o. 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, followed by TMA in oct 2024 presented with infected TMA site Pt has complicated PAD history and foot infection    he had endarterectomies of both rt and left common, profunda and superficial femoral artery, fogary embolectomy of rt SFA, stent placement in b/l common iliac arteries, left external iliac and Rt PTA, on 11/15/22  Had wound dehiscence on the rt groin  , he underwent debridement of the wound on the rt groin on 01/09/23 with excision of 15 cm2 of skin, soft tissue, muscle fascia and application of neg pressure vac He had serratia , staph aureus and ecoli in culture and received Iv antibiotic followed by PO cipro + Augmentin  He also underwent TMA on 02/17/23 He followed with Podiatrist as OP On 12/24 he came to the ED with rt knee and leg pain after he fell out of bed. The TMA site was red , weeping and and he was started on keflex  and bactrim  and discharged from ED. Xray was done in the ED on 12/14 and it did not show any fracture.   He returned tot he ED with rt leg swelling upto thigh , pain and confusion   ID: Manuel [name] is a 78 y.o. male  Principal Problem:   Osteomyelitis (HCC) Active Problems:   Hypertension   Coronary artery disease with history of myocardial infarction without history of CABG   Diabetes mellitus type 2, insulin  dependent (HCC)   Mixed hyperlipidemia   Lower limb ischemia right leg with history of angioplasty and stent on 05/14/2021   Chronic combined systolic and diastolic CHF (congestive heart failure) (HCC)   Acute kidney injury superimposed on chronic kidney disease (HCC)   Hyponatremia   Uncontrolled type 2 diabetes mellitus with hyperglycemia, with long-term current use of insulin  (HCC)   Essential hypertension   Dyslipidemia   BPH (benign prostatic hyperplasia)   Acute  metabolic encephalopathy   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)   Diabetic infection of right foot (HCC)    Subjective: Pt is sleeping now Wife at bed side [place] he worked with PT bed side Sat in the chair And now sleeping  Medications:   aspirin  EC  81 mg Oral Daily   atorvastatin   80 mg Oral QHS   carvedilol   25 mg Oral BID WC   clopidogrel   75 mg Oral Daily   dapagliflozin  propanediol  10 mg Oral Daily   enoxaparin  (LOVENOX ) injection  40 mg Subcutaneous Q24H   folic acid   1 mg Oral Daily   insulin  aspart  0-20 Units Subcutaneous TID WC   insulin  aspart  0-5 Units Subcutaneous QHS   insulin  aspart protamine - aspart  30 Units Subcutaneous BID WC   iron  polysaccharides  150 mg Oral Daily   mirtazapine   15 mg Oral QHS   multivitamin with minerals  1 tablet Oral Daily   nystatin   5 mL Oral QID   pregabalin   75 mg Oral BID   Ensure Max Protein  11 oz Oral Daily   sacubitril -valsartan   1 tablet Oral Q12H   spironolactone   50 mg Oral Daily   tamsulosin   0.4 mg Oral QPC supper   thiamine   100 mg Oral Daily    Objective: Vital signs in last 24 hours: Patient Vitals for the past 24 hrs:  BP Temp Temp src Pulse Resp SpO2  04/25/23 0739 129/67 99 F (37.2 C) Oral 77 17 100 %  04/24/23 2210 130/64 98.3 F (36.8 C) -- 68 20 99 %      PHYSICAL EXAM:  General: sleeping,  Lungs:b/l air entry Heart: s1s2 Abdomen: Soft, tendernes sleft side of abdomen Extremities: rt BKA- surgical dressing was changed by the vascualr team Skin: No rashes or lesions. Or bruising Neurologic: Grossly non-focal  Lab Results    Latest Ref Rng & Units 04/25/2023    4:29 AM 04/24/2023    4:17 AM 04/23/2023    4:17 AM  CBC  WBC 4.0 - 10.5 K/uL 11.9  10.8  11.0   Hemoglobin 13.0 - 17.0 g/dL 8.3  8.3  8.5   Hematocrit 39.0 - 52.0 % 25.6  25.7  25.1   Platelets 150 - 400 K/uL 468  440  410        Latest Ref Rng & Units 04/25/2023    4:29 AM 04/24/2023    4:17 AM 04/23/2023    4:17 AM  CMP   Glucose 70 - 99 mg/dL 893  843  899   BUN 8 - 23 mg/dL 26  29  31    Creatinine 0.61 - 1.24 mg/dL 9.16  9.14  9.18   Sodium 135 - 145 mmol/L 134  133  134   Potassium 3.5 - 5.1 mmol/L 4.1  4.3  4.7   Chloride 98 - 111 mmol/L 105  104  104   CO2 22 - 32 mmol/L 18  22  18    Calcium  8.9 - 10.3 mg/dL 8.2  8.2  8.4   Total Protein 6.5 - 8.1 g/dL  6.0    Total Bilirubin 0.0 - 1.2 mg/dL  0.8    Alkaline Phos 38 - 126 U/L  103    AST 15 - 41 U/L  46    ALT 0 - 44 U/L  68        Microbiology: Synovial fluid- no organisms seen ( was previously reported as gram positive cocci- spoke to lab Culture neg so far    Assessment/Plan:  ?Rt TMA site infection- underwent BKA Wound culture was MSSA   Rt knee septic arthritis- s/p I/D culture NG so far  On ceftriaxone  and vanco- - culture no growth sofar- continue both antibiotics by IV for a minimum of 2 weeks ( 05/06/23)  and then can switch to PO    DM- management as per primary team   PAD-- h/o endarterectomies of both rt and left common, profunda and superficial femoral artery, fogary embolectomy of rt SFA, stent placement in b/l common iliac arteries, left external iliac and Rt PTA, on 11/15/22    AKI- resolved   Anemia   Intermittent and chronic  left sided abdominal pain-management as per hospitalist In 2023 Ct  showed colonic diverticulosis, saccular aneursym of 2.3 cm and superior end plate fracture of l1  Discussed the management with  wife and hospitalist  ID will not see him this weekend Contact the IDP on call if needed

## 2023-04-26 DIAGNOSIS — E871 Hypo-osmolality and hyponatremia: Secondary | ICD-10-CM | POA: Diagnosis not present

## 2023-04-26 DIAGNOSIS — L089 Local infection of the skin and subcutaneous tissue, unspecified: Secondary | ICD-10-CM | POA: Diagnosis not present

## 2023-04-26 DIAGNOSIS — T148XXA Other injury of unspecified body region, initial encounter: Secondary | ICD-10-CM | POA: Diagnosis not present

## 2023-04-26 LAB — GLUCOSE, CAPILLARY
Glucose-Capillary: 115 mg/dL — ABNORMAL HIGH (ref 70–99)
Glucose-Capillary: 172 mg/dL — ABNORMAL HIGH (ref 70–99)
Glucose-Capillary: 117 mg/dL — ABNORMAL HIGH (ref 70–99)
Glucose-Capillary: 74 mg/dL (ref 70–99)

## 2023-04-26 NOTE — Progress Notes (Signed)
 Progress Note   Patient: Manuel [name] FMW:[id number] DOB: [birth year] DOA: 04/18/2023     7 DOS: the patient was seen and examined on 04/26/2023   Brief hospital course: 78yo with h/o CAD, HTN, DM, and chronic systolic CHF who presented on 80/72 with AMS and wound infection s/p recent I&D of R groin wound and prior R 4th/5th partial ray amputation (7/29).  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  He then underwent R knee I&D for septic/gouty arthritis on 12/30.  He will need SNF rehab.  Vascular surgery is recommending ASA 81 mg daily + Plavix  and Lipitor .  ID is consulting for antibiotic recommendations.  Assessment and Plan:  Cellulitis in the setting of an ischemic ulcer of the left lower extremity with osteomyelitis Nonhealing wound despite prior ray amputation Family and patient opted to proceed with R BKA which was done 12/29 Also with septic knee with gouty arthritis and so underwent R knee I&D on 12/30; knee with MSSA so will stop colchicine  for now (lots of interactions) and resume if needed Wound cultures with staph aureus, MSSA with resistance to Cipro , Clinda, Erythromycin, Tetracycline Seen by ID:  On ceftriaxone  and vanco- - culture no growth sofar- continue both antibiotics by IV for a minimum of 2 weeks ( 05/06/23)  and then can switch to PO  Blood cultures negative x 5 days He appears likely to need CIR and PT/OT/PM&R MD are in agreement Awaiting bed availability  Intermittent L side pain Wife reports that this is a chronic issue but seems to worsen after surgeries There is no obvious pattern to the pain Continue to monitor without intervention for now but consider imaging should pain worsen or become more consistent -denies current pain   Acute on chronic mild hyponatremia Appears to be a chronic issue, stable   Chronic systolic heart failure with a last EF of 30% in October 2025/ischemic cardiomyopathy Continue Coreg , Farxiga  Held Entresto ,  torsemide  and Aldactone  due to acute kidney injury Resume Entresto  and Aldactone  for now Consider restarting torsemide  in next 24-48 hours (AM labs ordered)   Acute metabolic encephalopathy Likely multifactorial in the setting of hypovolemia and infectious etiology Wife raises some concern for possible dementia - delirium precautions   Uncontrolled type 2 diabetes mellitus with hyperglycemia, with long-term current use of insulin   A1c was 6.9 in 10/2022 Continue NovoLog  70/30, 30 units twice a day, plus correction scale.   BPH Continue tamsulosin    Dyslipidemia Continue atorvastatin    PVD Continue ASA, Plavix    Essential hypertension Continue Coreg  Hold Entresto , torsemide  and Aldactone  in the setting of acute kidney injury Use hydralazine  IV as needed   Chronic pain Continue mirtazepine, Lyrica , Percocet         Consultants: Podiatry Vascular surgery Orthopedics ID PT/OT TOC team   Procedures: R AKA 12/29   Antibiotics: Ceftriaxone  12/28-1/11 and then change to PO Vancomycin  12/28-1/11 and then change to PO   Subjective:  No current complaints    Objective: Vitals:   04/26/23 0008 04/26/23 0752  BP: (!) 111/55 135/71  Pulse: 77 72  Resp: 16 15  Temp: 98.8 F (37.1 C) 98.2 F (36.8 C)  SpO2: 95% 98%    Intake/Output Summary (Last 24 hours) at 04/26/2023 0858 Last data filed at 04/25/2023 2022 Gross per 24 hour  Intake 1049.2 ml  Output 1300 ml  Net -250.8 ml   Filed Weights   04/18/23 1755  Weight: 83.9 kg    Exam:  General: Appearance:     Overweight male in no acute distress     Lungs:     respirations unlabored  Heart:    Normal heart rate.  MS:   Right  amputation noted.   Neurologic:   Awake, alert, oriented x 3. No apparent focal neurological           defect.    Data Reviewed: I have reviewed the patient's lab results since admission.  Pertinent labs for today include: No labs today  Family Communication: none  present  Disposition: Status is: Inpatient Remains inpatient appropriate because: awaiting CIR     Time spent: 50 minutes  Unresulted Labs (From admission, onward)    None        Author: Calem Cocozza U Genny Caulder, DO 04/26/2023 8:58 AM  For on call review [email or web address].

## 2023-04-26 NOTE — Progress Notes (Signed)
 Subjective  - POD #6, Status post right below-knee amputation  Not complaining of any pain   Physical Exam:  Dressing was changed today.  His stump continues to heal appropriately.  There is no erythema.  There is no drainage.       Assessment/Plan:  POD #6  Awaiting final disposition plans.  The patient would greatly benefit from acute inpatient rehab.  He will need evaluation for stump shrinker, which can be done at rehab.  In the interim, he should receive daily dressing changes with a Kerlix and Ace wrap  [name] 04/26/2023 12:00 PM --  Vitals:   04/26/23 0008 04/26/23 0752  BP: (!) 111/55 135/71  Pulse: 77 72  Resp: 16 15  Temp: 98.8 F (37.1 C) 98.2 F (36.8 C)  SpO2: 95% 98%    Intake/Output Summary (Last 24 hours) at 04/26/2023 1200 Last data filed at 04/25/2023 2022 Gross per 24 hour  Intake 929.2 ml  Output 650 ml  Net 279.2 ml     Laboratory CBC    Component Value Date/Time   WBC 11.9 (H) 04/25/2023 0429   HGB 8.3 (L) 04/25/2023 0429   HGB 15.7 02/16/2012 2013   HCT 25.6 (L) 04/25/2023 0429   HCT 44.4 02/16/2012 2013   PLT 468 (H) 04/25/2023 0429   PLT 176 02/16/2012 2013    BMET    Component Value Date/Time   NA 134 (L) 04/25/2023 0429   NA 137 02/16/2012 2013   K 4.1 04/25/2023 0429   K 3.8 02/16/2012 2013   CL 105 04/25/2023 0429   CL 102 02/16/2012 2013   CO2 18 (L) 04/25/2023 0429   CO2 25 02/16/2012 2013   GLUCOSE 106 (H) 04/25/2023 0429   GLUCOSE 154 (H) 02/16/2012 2013   BUN 26 (H) 04/25/2023 0429   BUN 10 02/16/2012 2013   CREATININE 0.83 04/25/2023 0429   CREATININE 0.72 02/16/2012 2013   CALCIUM  8.2 (L) 04/25/2023 0429   CALCIUM  9.6 02/16/2012 2013   GFRNONAA >60 04/25/2023 0429   GFRNONAA >60 02/16/2012 2013   GFRAA >60 12/09/2018 0929   GFRAA >60 02/16/2012 2013    COAG Lab Results  Component Value Date   INR 1.4 (H) 04/19/2023   INR 1.3 (H) 01/15/2023   INR 1.1 11/10/2022   No results found for:  PTT  Antibiotics Anti-infectives (From admission, onward)    Start     Dose/Rate Route Frequency Ordered Stop   04/26/23 1400  cefTRIAXone  (ROCEPHIN ) 2 g in sodium chloride  0.9 % 100 mL IVPB        2 g 200 mL/hr over 30 Minutes Intravenous Every 24 hours 04/25/23 1720 05/03/23 1359   04/26/23 0600  vancomycin  (VANCOREADY) IVPB 2000 mg/400 mL        2,000 mg 200 mL/hr over 120 Minutes Intravenous Every 24 hours 04/25/23 1720 05/03/23 0559   04/24/23 0600  vancomycin  (VANCOREADY) IVPB 2000 mg/400 mL        2,000 mg 200 mL/hr over 120 Minutes Intravenous Every 24 hours 04/23/23 1314 04/25/23 0728   04/23/23 0830  vancomycin  (VANCOREADY) IVPB 1500 mg/300 mL  Status:  Discontinued        1,500 mg 150 mL/hr over 120 Minutes Intravenous Every 24 hours 04/22/23 0957 04/23/23 1314   04/20/23 0830  vancomycin  (VANCOREADY) IVPB 1250 mg/250 mL  Status:  Discontinued        1,250 mg 166.7 mL/hr over 90 Minutes Intravenous Every 24 hours 04/20/23 0750 04/22/23  0957   04/20/23 0800  vancomycin  (VANCOCIN ) IVPB 1000 mg/200 mL premix  Status:  Discontinued        1,000 mg 200 mL/hr over 60 Minutes Intravenous Every 24 hours 04/19/23 1440 04/20/23 0750   04/19/23 1400  cefTRIAXone  (ROCEPHIN ) 2 g in sodium chloride  0.9 % 100 mL IVPB        2 g 200 mL/hr over 30 Minutes Intravenous Every 24 hours 04/19/23 0228 04/25/23 1454   04/19/23 0800  vancomycin  (VANCOREADY) IVPB 750 mg/150 mL  Status:  Discontinued        750 mg 150 mL/hr over 60 Minutes Intravenous Every 24 hours 04/19/23 0237 04/19/23 1440   04/19/23 0230  vancomycin  (VANCOCIN ) IVPB 1000 mg/200 mL premix  Status:  Discontinued        1,000 mg 200 mL/hr over 60 Minutes Intravenous  Once 04/19/23 0228 04/19/23 0230   04/19/23 0130  ceFEPIme  (MAXIPIME ) 2 g in sodium chloride  0.9 % 100 mL IVPB        2 g 200 mL/hr over 30 Minutes Intravenous  Once 04/19/23 0121 04/19/23 0240   04/19/23 0130  Vancomycin  (VANCOCIN ) 1,500 mg in sodium chloride  0.9  % 500 mL IVPB        1,500 mg 250 mL/hr over 120 Minutes Intravenous  Once 04/19/23 0121 04/19/23 0438        V. Malvina Serene CLORE, M.D., [facility] [place] Office: [phone] Pager:  [phone]

## 2023-04-26 NOTE — Progress Notes (Signed)
 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 Patient is sleeping well this morning, denies any pain at this time. Planning for discharge to CIR at this time. Negative for chest pain and shortness of breath Fever:  No recent fevers. Gastrointestinal:Negative for nausea and vomiting  Objective: Vital signs in last 24 hours: Temp:  [98.2 F (36.8 C)-99 F (37.2 C)] 98.8 F (37.1 C) (01/04 0008) Pulse Rate:  [70-77] 77 (01/04 0008) Resp:  [16-17] 16 (01/04 0008) BP: (111-137)/(55-70) 111/55 (01/04 0008) SpO2:  [95 %-100 %] 95 % (01/04 0008)  Intake/Output from previous day:  Intake/Output Summary (Last 24 hours) at 04/26/2023 0727 Last data filed at 04/25/2023 2022 Gross per 24 hour  Intake 1049.2 ml  Output 1300 ml  Net -250.8 ml    Intake/Output this shift: No intake/output data recorded.  Labs: Recent Labs    04/24/23 0417 04/25/23 0429  HGB 8.3* 8.3*   Recent Labs    04/24/23 0417 04/25/23 0429  WBC 10.8* 11.9*  RBC 3.00* 2.98*  HCT 25.7* 25.6*  PLT 440* 468*   Recent Labs    04/24/23 0417 04/25/23 0429  NA 133* 134*  K 4.3 4.1  CL 104 105  CO2 22 18*  BUN 29* 26*  CREATININE 0.85 0.83  GLUCOSE 156* 106*  CALCIUM  8.2* 8.2*   No results for input(s): LABPT, INR in the last 72 hours.  EXAM General - Patient is sleeping well this AM, but is able to be aroused and answers questions appropriately. Extremity - ACE Wrap applied to the right lower extremity this morning. No drainage noted to the ACE wrap this AM. Tolerated palpation around the right knee with mild pain this morning. Abdomen soft to palpation.  Past Medical History:  Diagnosis Date   Acute ST elevation myocardial infarction (STEMI) of inferior wall (HCC) 04/19/2010   a.) transfered from [facility] to [facility] --> LHC/PCI (very difficult procedure) --> 3.0 x 23 mm and 3.0 x 12 mm Xience  stents to RCA   Allergies    Arthritis    Benign essential hypertension    Bilateral carotid artery disease (HCC) 05/08/2021   a.) carotid doppler 05/08/2021: 1-39% BICA   CAD (coronary artery disease) 04/19/2010   a.) inferior STEMI 04/19/2010 --> LHC/PCI: 50-70% pD1, 80% pRI, 90/90/90% RCA (overlapping 3.0 x 23 and 3.0 x 12 mm Xience DES); b.) MV 11/10/2018: fixed minimally reversible inferior perfusion defect   Cellulitis of foot    DDD (degenerative disc disease), cervical    Diabetes mellitus type 2, insulin  dependent (HCC)    Diverticulosis    Full dentures    Gout    Hard of hearing    History of bilateral cataract extraction 2022   History of ETOH abuse    Hyperlipidemia    Ischemic cardiomyopathy 04/19/2010   a.) TTE 04/19/2010: 40%; b.) TTE 04/20/2014: EF >55%, mild RVE, triv PR, mild MR/TR, G1DD; c.) TTE 11/10/2018: EF 45%, inf HK, mild RVE, triv TR/PR, mild MR, G1DD; d.) TTE 11/14/2022: EF 25-30%, basal anteroseptal, apical lateral, apical septal, and apex AK, mid anterolateral HK, mild-mod MR/TR   Long term current use of aspirin     Long term current use of clopidogrel     Lumbar degenerative disc disease    Lumbar radiculopathy    Lumbar vertebral fracture (chronic superior endplate of L1)    OSA (obstructive sleep apnea)    a.) unable to tolerate  nocturnal PAP therapy   Peripheral artery disease (HCC)    a.) stenting 05/14/21: 12 mm x 12 cm LifeStent RIGHT dis SFA/prox pop; b.) s/p cath directed thrombolysis RIGHT SFA/pop 06/14/21; c.) s/p mech thrombectomy + stenting 06/15/21: 8 mm x 25cm & 8 mm x 7.5cm Viabahn; d.) s/p BILAT CFA, profunda femoris, SFA endarterectomies + fogarty embolectomy + stenting 11/15/22: 12mm x 58mm Lifestream BILAT CIAs, 14 mm x 6 cm Lifestream & 13 mm x 5 cm Viabahn LEFT EIA   Peripheral neuropathy    Umbilical hernia    Assessment/Plan: 5 Days Post-Op Procedure(s) (LRB): IRRIGATION AND DEBRIDEMENT KNEE (Right) Principal Problem:    Osteomyelitis (HCC) Active Problems:   Hypertension   Coronary artery disease with history of myocardial infarction without history of CABG   Diabetes mellitus type 2, insulin  dependent (HCC)   Mixed hyperlipidemia   Lower limb ischemia right leg with history of angioplasty and stent on 05/14/2021   Chronic combined systolic and diastolic CHF (congestive heart failure) (HCC)   Acute kidney injury superimposed on chronic kidney disease (HCC)   Hyponatremia   Uncontrolled type 2 diabetes mellitus with hyperglycemia, with long-term current use of insulin  (HCC)   Essential hypertension   Dyslipidemia   BPH (benign prostatic hyperplasia)   Acute metabolic encephalopathy   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   Weight as of this encounter: 83.9 kg. Up with therapy as tolerated.  No recent fevers. Hemoglobin at 8.3 on 1/3.  WBC on 1/3 was 11.9. Cultures from surgery still pending, gram stain and culture without growth at this time. Continue on Rocephin  and Vacomycin per infectious disease. WIll defer to vascular for wound changes to the right BKA. Per Locums, staples to be removed in 2-3 weeks.  DVT Prophylaxis - Lovenox  and Plavix  NWB to right lower extremity.  DOROTHA Gustavo Level, PA-C, CAQ-OS [facility] 04/26/2023, 7:27 AM

## 2023-04-26 NOTE — Progress Notes (Signed)
 Physical Therapy Treatment Patient Details Name: Manuel [name] MRN: [id number] DOB: 1946-04-20 Today's Date: 04/26/2023   History of Present Illness Pt is a 78 y.o. male presenting to hospital 04/19/23 with c/o AMS, weakness, fall, and R leg swelling/pain.  Recently seen in ED for fall onto R knee causing effusion and pain; pt s/p R foot TMA 2 months prior.  Pt admitted with sepsis d/t cellulitis, AKI superimposed on CKD, hyponatremia, acute metabolic encephalopathy.  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  S/p R knee aspiration 04/20/23. S/p R knee joint I&D 04/21/23 d/t R septic knee.  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    PT Comments  Pt was long sitting in bed upon arrival. He agrees to session and remains cooperative and pleasant throughout. Oriented x3 but does have some cognition concerns come to light during session. Pt is motivated and follows commands well. He demonstrated abilities to safely exit L side of bed, stand to RW,and pivot to recliner. Extensive time spent in standing to try to progress to hopping. However pt unable. He is easily able to pivot but unable to clear LLE to advance gait. Reviewed and perform HEP previous issued. Acute PT will continue to follow and progress per current POC. Dc recs remain appropriate.    If plan is discharge home, recommend the following: A lot of help with walking and/or transfers;A lot of help with bathing/dressing/bathroom;Assistance with cooking/housework;Direct supervision/assist for medications management;Direct supervision/assist for financial management;Assist for transportation;Help with stairs or ramp for entrance;Supervision due to cognitive status     Equipment Recommendations  Other (comment) (Defer to next level)       Precautions / Restrictions Precautions Precautions: Fall Restrictions Weight Bearing Restrictions Per Provider  Order: Yes RLE Weight Bearing Per Provider Order: Non weight bearing Other Position/Activity Restrictions: R BKA     Mobility  Bed Mobility Overal bed mobility: Needs Assistance Bed Mobility: Supine to Sit Rolling: Min assist, Mod assist, Used rails Supine to sit: Min assist, Mod assist  General bed mobility comments: increased time required however pt does well overall. vcs for sequencing improvements    Transfers Overall transfer level: Needs assistance Equipment used: Rolling walker (2 wheels) Transfers: Sit to/from Stand Sit to Stand: Mod assist, From elevated surface Stand pivot transfers: Mod assist  General transfer comment: Pt was able to stand 2 x EOB prior to stand pivot to recliner from EOB.    Ambulation/Gait  General Gait Details: Attempted hopping in place. pt struggles to clear foot. was able to safely pivot on LLE.   Balance Overall balance assessment: Needs assistance Sitting-balance support: No upper extremity supported, Feet supported Sitting balance-Leahy Scale: Good     Standing balance support: During functional activity, Reliant on assistive device for balance Standing balance-Leahy Scale: Poor Standing balance comment: heavy BUE support on RW in standing for balance    Cognition Arousal: Alert Behavior During Therapy: WFL for tasks assessed/performed Overall Cognitive Status: Within Functional Limits for tasks assessed      General Comments: Pt is A and O x 3. follows commands consistently throughout        Exercises Amputee Exercises Quad Sets: AROM, 10 reps Knee Flexion: AROM, 10 reps Knee Extension: AROM, 20 reps Other Exercises Other Exercises: Pt tolerated exrcises and stretching well. CIR remain appropriate    General Comments General comments (skin integrity, edema, etc.): reviewed ther ex prevuiously issued and importance of stretching  HS.      Pertinent Vitals/Pain Pain Assessment Pain Assessment: No/denies pain     PT  Goals (current goals can now be found in the care plan section) Acute Rehab PT Goals Patient Stated Goal: to feel better and have less pain Progress towards PT goals: Progressing toward goals    Frequency    Min 1X/week           Co-evaluation     PT goals addressed during session: Mobility/safety with mobility;Balance;Proper use of DME;Strengthening/ROM        AM-PAC PT 6 Clicks Mobility   Outcome Measure  Help needed turning from your back to your side while in a flat bed without using bedrails?: A Lot Help needed moving from lying on your back to sitting on the side of a flat bed without using bedrails?: A Lot Help needed moving to and from a bed to a chair (including a wheelchair)?: A Lot Help needed standing up from a chair using your arms (e.g., wheelchair or bedside chair)?: A Lot Help needed to walk in hospital room?: Total Help needed climbing 3-5 steps with a railing? : Total 6 Click Score: 10    End of Session   Activity Tolerance: Patient tolerated treatment well Patient left: in chair;with call bell/phone within reach;with chair alarm set;with family/visitor present Nurse Communication: Mobility status;Precautions;Other (comment) PT Visit Diagnosis: Other abnormalities of gait and mobility (R26.89);Muscle weakness (generalized) (M62.81);History of falling (Z91.81);Pain Pain - Right/Left: Right Pain - part of body: Knee;Leg     Time: 1050-1103 PT Time Calculation (min) (ACUTE ONLY): 13 min  Charges:    $Therapeutic Activity: 8-22 mins PT General Charges $$ ACUTE PT VISIT: 1 Visit                     Rankin Essex PTA 04/26/23, 1:06 PM

## 2023-04-27 DIAGNOSIS — N179 Acute kidney failure, unspecified: Secondary | ICD-10-CM | POA: Diagnosis not present

## 2023-04-27 DIAGNOSIS — T148XXA Other injury of unspecified body region, initial encounter: Secondary | ICD-10-CM | POA: Diagnosis not present

## 2023-04-27 DIAGNOSIS — E871 Hypo-osmolality and hyponatremia: Secondary | ICD-10-CM | POA: Diagnosis not present

## 2023-04-27 DIAGNOSIS — L089 Local infection of the skin and subcutaneous tissue, unspecified: Secondary | ICD-10-CM | POA: Diagnosis not present

## 2023-04-27 LAB — BASIC METABOLIC PANEL
Anion gap: 4 — ABNORMAL LOW (ref 5–15)
BUN: 23 mg/dL (ref 8–23)
CO2: 22 mmol/L (ref 22–32)
Calcium: 8.1 mg/dL — ABNORMAL LOW (ref 8.9–10.3)
Chloride: 109 mmol/L (ref 98–111)
Creatinine, Ser: 0.75 mg/dL (ref 0.61–1.24)
GFR, Estimated: >60 mL/min (ref >60)
Glucose, Bld: 57 mg/dL — ABNORMAL LOW (ref 70–99)
Potassium: 4.2 mmol/L (ref 3.5–5.1)
Sodium: 135 mmol/L (ref 135–145)

## 2023-04-27 LAB — GLUCOSE, CAPILLARY
Glucose-Capillary: 54 mg/dL — ABNORMAL LOW (ref 70–99)
Glucose-Capillary: 55 mg/dL — ABNORMAL LOW (ref 70–99)
Glucose-Capillary: 84 mg/dL (ref 70–99)
Glucose-Capillary: 145 mg/dL — ABNORMAL HIGH (ref 70–99)
Glucose-Capillary: 152 mg/dL — ABNORMAL HIGH (ref 70–99)
Glucose-Capillary: 115 mg/dL — ABNORMAL HIGH (ref 70–99)

## 2023-04-27 LAB — CBC
HCT: 22.4 % — ABNORMAL LOW (ref 39.0–52.0)
Hemoglobin: 7.5 g/dL — ABNORMAL LOW (ref 13.0–17.0)
MCH: 28.8 pg (ref 26.0–34.0)
MCHC: 33.5 g/dL (ref 30.0–36.0)
MCV: 86.2 fL (ref 80.0–100.0)
Platelets: 454 10*3/uL — ABNORMAL HIGH (ref 150–400)
RBC: 2.6 MIL/uL — ABNORMAL LOW (ref 4.22–5.81)
RDW: 16.2 % — ABNORMAL HIGH (ref 11.5–15.5)
WBC: 9.6 10*3/uL (ref 4.0–10.5)
nRBC: 0 % (ref 0.0–0.2)

## 2023-04-27 MED ORDER — INSULIN ASPART PROT & ASPART (70-30 MIX) 100 UNIT/ML ~~LOC~~ SUSP
25.0000 [IU] | Freq: Two times a day (BID) | SUBCUTANEOUS | Status: DC
  Administered 2023-04-27 – 2023-04-28 (×3): 25 [IU] via SUBCUTANEOUS
  Filled 2023-04-27: qty 10

## 2023-04-27 NOTE — Progress Notes (Signed)
 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 Patient is sleeping well this morning, denies any pain at this time when he is woken up. Planning for discharge to CIR at this time. Negative for chest pain and shortness of breath Fever:  No recent fevers. Gastrointestinal:Negative for nausea and vomiting Patient has had a BM.  Objective: Vital signs in last 24 hours: Temp:  [98.2 F (36.8 C)-98.7 F (37.1 C)] 98.7 F (37.1 C) (01/04 2334) Pulse Rate:  [71-72] 72 (01/04 2334) Resp:  [15-20] 20 (01/04 2334) BP: (104-135)/(61-71) 104/61 (01/04 2334) SpO2:  [94 %-98 %] 94 % (01/04 2334)  Intake/Output from previous day:  Intake/Output Summary (Last 24 hours) at 04/27/2023 0748 Last data filed at 04/27/2023 0523 Gross per 24 hour  Intake 650 ml  Output 500 ml  Net 150 ml    Intake/Output this shift: No intake/output data recorded.  Labs: Recent Labs    04/25/23 0429 04/27/23 0511  HGB 8.3* 7.5*   Recent Labs    04/25/23 0429 04/27/23 0511  WBC 11.9* 9.6  RBC 2.98* 2.60*  HCT 25.6* 22.4*  PLT 468* 454*   Recent Labs    04/25/23 0429 04/27/23 0511  NA 134* 135  K 4.1 4.2  CL 105 109  CO2 18* 22  BUN 26* 23  CREATININE 0.83 0.75  GLUCOSE 106* 57*  CALCIUM  8.2* 8.1*   No results for input(s): LABPT, INR in the last 72 hours.  EXAM General - Patient is sleeping well this AM, but is able to be aroused and answers questions appropriately. Extremity - ACE Wrap applied to the right lower extremity this morning. No drainage noted to the ACE wrap this AM.  Exposed knee incision without drainage or erythema.  Minimal swelling is noted. Tolerated palpation around the right knee with mild pain this morning. Abdomen soft to palpation.  Past Medical History:  Diagnosis Date   Acute ST elevation myocardial infarction (STEMI) of inferior wall (HCC) 04/19/2010    a.) transfered from [facility] to [facility] --> LHC/PCI (very difficult procedure) --> 3.0 x 23 mm and 3.0 x 12 mm Xience stents to RCA   Allergies    Arthritis    Benign essential hypertension    Bilateral carotid artery disease (HCC) 05/08/2021   a.) carotid doppler 05/08/2021: 1-39% BICA   CAD (coronary artery disease) 04/19/2010   a.) inferior STEMI 04/19/2010 --> LHC/PCI: 50-70% pD1, 80% pRI, 90/90/90% RCA (overlapping 3.0 x 23 and 3.0 x 12 mm Xience DES); b.) MV 11/10/2018: fixed minimally reversible inferior perfusion defect   Cellulitis of foot    DDD (degenerative disc disease), cervical    Diabetes mellitus type 2, insulin  dependent (HCC)    Diverticulosis    Full dentures    Gout    Hard of hearing    History of bilateral cataract extraction 2022   History of ETOH abuse    Hyperlipidemia    Ischemic cardiomyopathy 04/19/2010   a.) TTE 04/19/2010: 40%; b.) TTE 04/20/2014: EF >55%, mild RVE, triv PR, mild MR/TR, G1DD; c.) TTE 11/10/2018: EF 45%, inf HK, mild RVE, triv TR/PR, mild MR, G1DD; d.) TTE 11/14/2022: EF 25-30%, basal anteroseptal, apical lateral, apical septal, and apex AK, mid anterolateral HK, mild-mod MR/TR   Long term current use of aspirin     Long term current use of clopidogrel     Lumbar degenerative disc disease    Lumbar radiculopathy  Lumbar vertebral fracture (chronic superior endplate of L1)    OSA (obstructive sleep apnea)    a.) unable to tolerate nocturnal PAP therapy   Peripheral artery disease (HCC)    a.) stenting 05/14/21: 12 mm x 12 cm LifeStent RIGHT dis SFA/prox pop; b.) s/p cath directed thrombolysis RIGHT SFA/pop 06/14/21; c.) s/p mech thrombectomy + stenting 06/15/21: 8 mm x 25cm & 8 mm x 7.5cm Viabahn; d.) s/p BILAT CFA, profunda femoris, SFA endarterectomies + fogarty embolectomy + stenting 11/15/22: 12mm x 58mm Lifestream BILAT CIAs, 14 mm x 6 cm Lifestream & 13 mm x 5 cm Viabahn LEFT EIA   Peripheral neuropathy    Umbilical hernia     Assessment/Plan: 6 Days Post-Op Procedure(s) (LRB): IRRIGATION AND DEBRIDEMENT KNEE (Right) Principal Problem:   Osteomyelitis (HCC) Active Problems:   Hypertension   Coronary artery disease with history of myocardial infarction without history of CABG   Diabetes mellitus type 2, insulin  dependent (HCC)   Mixed hyperlipidemia   Lower limb ischemia right leg with history of angioplasty and stent on 05/14/2021   Chronic combined systolic and diastolic CHF (congestive heart failure) (HCC)   Acute kidney injury superimposed on chronic kidney disease (HCC)   Hyponatremia   Uncontrolled type 2 diabetes mellitus with hyperglycemia, with long-term current use of insulin  (HCC)   Essential hypertension   Dyslipidemia   BPH (benign prostatic hyperplasia)   Acute metabolic encephalopathy   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   Weight as of this encounter: 83.9 kg. Up with therapy as tolerated.  No recent fevers. Hemoglobin at 7.5 this AM.  Continue to monitor. WBC down to 9.6 this morning. Cultures are still not demonstrating any growth at this time. Continue on Rocephin  and Vacomycin per infectious disease. WIll defer to vascular for wound changes to the right BKA. Per Locums, staples to be removed in 2-3 weeks.  DVT Prophylaxis - Lovenox  and Plavix  NWB to right lower extremity.  DOROTHA Gustavo Level, PA-C, CAQ-OS [facility]opa[facility] Surgery 04/27/2023, 7:48 AM

## 2023-04-27 NOTE — Plan of Care (Signed)

## 2023-04-27 NOTE — Progress Notes (Signed)
 Progress Note   Patient: Manuel [name] FMW:[id number] DOB: 05-Jan-1946 DOA: 04/18/2023     8 DOS: the patient was seen and examined on 04/27/2023   Brief hospital course: 77yo with h/o CAD, HTN, DM, and chronic systolic CHF who presented on 87/72 with AMS and wound infection s/p recent I&D of R groin wound and prior R 4th/5th partial ray amputation (7/29).  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  He then underwent R knee I&D for septic/gouty arthritis on 12/30.  He will need SNF rehab.  Vascular surgery is recommending ASA 81 mg daily + Plavix  and Lipitor .  ID is consulting for antibiotic recommendations.  Assessment and Plan:  Cellulitis in the setting of an ischemic ulcer of the left lower extremity with osteomyelitis Nonhealing wound despite prior ray amputation Family and patient opted to proceed with R BKA which was done 12/29 Also with septic knee with gouty arthritis and so underwent R knee I&D on 12/30; knee with MSSA so will stop colchicine  for now (lots of interactions) and resume if needed Wound cultures with staph aureus, MSSA with resistance to Cipro , Clinda, Erythromycin, Tetracycline Seen by ID:  On ceftriaxone  and vanco- - culture no growth sofar- continue both antibiotics by IV for a minimum of 2 weeks ( 05/06/23)  and then can switch to PO  Blood cultures negative x 5 days He appears likely to need CIR and PT/OT/PM&R MD are in agreement Awaiting bed availability  Intermittent L side pain Wife reports that this is a chronic issue but seems to worsen after surgeries There is no obvious pattern to the pain Continue to monitor without intervention for now but consider imaging should pain worsen or become more consistent -denies current pain   Acute on chronic mild hyponatremia Appears to be a chronic issue, stable   Chronic systolic heart failure with a last EF of 30% in October 2025/ischemic cardiomyopathy Continue Coreg , Farxiga  Held Entresto ,  torsemide  and Aldactone  due to acute kidney injury Resume Entresto  and Aldactone  for now Consider restarting torsemide  in next 24-48 hours (AM labs ordered)   Acute metabolic encephalopathy Likely multifactorial in the setting of hypovolemia and infectious etiology Wife raises some concern for possible dementia - delirium precautions   Uncontrolled type 2 diabetes mellitus with hyperglycemia, with long-term current use of insulin   A1c was 6.9 in 10/2022 Continue NovoLog  70/30, 30 units twice a day, plus correction scale.   BPH Continue tamsulosin    Dyslipidemia Continue atorvastatin    PVD Continue ASA, Plavix    Essential hypertension Continue Coreg  Hold Entresto , torsemide  and Aldactone  in the setting of acute kidney injury Use hydralazine  IV as needed   Chronic pain Continue mirtazepine, Lyrica , Percocet         Consultants: Podiatry Vascular surgery Orthopedics ID PT/OT TOC team   Procedures: R AKA 12/29   Antibiotics: Ceftriaxone  12/28-1/11 and then change to PO Vancomycin  12/28-1/11 and then change to PO   Subjective:  No overnight events   Objective: Vitals:   04/26/23 2334 04/27/23 0754  BP: 104/61 129/61  Pulse: 72 62  Resp: 20 17  Temp: 98.7 F (37.1 C)   SpO2: 94% 98%    Intake/Output Summary (Last 24 hours) at 04/27/2023 1023 Last data filed at 04/27/2023 0523 Gross per 24 hour  Intake 650 ml  Output 500 ml  Net 150 ml   Filed Weights   04/18/23 1755  Weight: 83.9 kg    Exam:   General: Appearance:  Overweight male in no acute distress     Lungs:     respirations unlabored  Heart:    Normal heart rate.  MS:   Right  amputation noted.   Neurologic:   Awake, alert, oriented x 3. No apparent focal neurological           defect.    Data Reviewed: I have reviewed the patient's lab results since admission.  Pertinent labs for today include: No labs today  Family Communication: none present  Disposition: Status is:  Inpatient Remains inpatient appropriate because: awaiting CIR     Time spent: 50 minutes  Unresulted Labs (From admission, onward)    None        Author: Heidemarie Goodnow U Rexanna Louthan, DO 04/27/2023 10:23 AM  For on call review [email or web address].

## 2023-04-27 NOTE — Progress Notes (Signed)
 Physical Therapy Treatment Patient Details Name: Manuel [name] MRN: [id number] DOB: 08-19-1945 Today's Date: 04/27/2023   History of Present Illness Pt is a 78 y.o. male presenting to hospital 04/19/23 with c/o AMS, weakness, fall, and R leg swelling/pain.  Recently seen in ED for fall onto R knee causing effusion and pain; pt s/p R foot TMA 2 months prior.  Pt admitted with sepsis d/t cellulitis, AKI superimposed on CKD, hyponatremia, acute metabolic encephalopathy.  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  S/p R knee aspiration 04/20/23. S/p R knee joint I&D 04/21/23 d/t R septic knee.  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    PT Comments  Pt in chair, ready for session.  RN in room stating she got him to chair with +1 assist.  Extra time spent this am answering his questions regarding discharge planning and progression to prosthetic and HEP, LE positioning etc with pt and wife.  Participated in exercises as described below.  Pt is able to stand from recliner to RW with mod a x 1 and close guarding once standing he is generally shaky today and did not attempt hopping or transfer as +1 assist is only available. Pt is able to lateral scoot from drop arm recliner to bed with min a x 1 and cues for technique.  Overall does well with transfer with no LOB and good mechanics.  He is given the option to return to recliner or bed and he opts for bed due to general fatigue.  Positioned with stump on towel roll and pillow prop on lateral side to allow for hamstring stretch.  Pt and wife again educated on positioning.     If plan is discharge home, recommend the following: Assistance with cooking/housework;Direct supervision/assist for medications management;Direct supervision/assist for financial management;Assist for transportation;Help with stairs or ramp for entrance;Supervision due to cognitive status;Two people to  help with walking and/or transfers;Two people to help with bathing/dressing/bathroom   Can travel by private vehicle        Equipment Recommendations       Recommendations for Other Services       Precautions / Restrictions Precautions Precautions: Fall Restrictions Weight Bearing Restrictions Per Provider Order: Yes RLE Weight Bearing Per Provider Order: Non weight bearing Other Position/Activity Restrictions: R BKA     Mobility  Bed Mobility Overal bed mobility: Needs Assistance Bed Mobility: Sit to Supine       Sit to supine: Contact guard assist     Patient Response: Cooperative  Transfers Overall transfer level: Needs assistance Equipment used: Rolling walker (2 wheels) Transfers: Sit to/from Stand, Bed to chair/wheelchair/BSC Sit to Stand: Mod assist          Lateral/Scoot Transfers: Min assist, Mod assist General transfer comment: from recliner to stand,  generally shaky and used lateral scoot back to bed for safety as only +1 available    Ambulation/Gait               General Gait Details: deferred and pt generally shakey in standing today.   Stairs             Wheelchair Mobility     Tilt Bed Tilt Bed Patient Response: Cooperative  Modified Rankin (Stroke Patients Only)       Balance Overall balance assessment: Needs assistance Sitting-balance support: No upper extremity supported, Feet supported Sitting balance-Leahy Scale: Good     Standing balance support: During  functional activity, Reliant on assistive device for balance Standing balance-Leahy Scale: Poor Standing balance comment: heavy BUE support on RW in standing for balance                            Cognition Arousal: Alert Behavior During Therapy: WFL for tasks assessed/performed Overall Cognitive Status: Within Functional Limits for tasks assessed                                 General Comments: talkative and needs redirection  but ready and motivated to work        Exercises Other Exercises Other Exercises: 4 way hip in chair for RLE along with quad sets and education regarding expectations for trasitions to CIR and prssthetic timeline    General Comments        Pertinent Vitals/Pain Pain Assessment Pain Assessment: Faces Faces Pain Scale: Hurts little more Pain Location: distal stump Pain Descriptors / Indicators: Sore Pain Intervention(s): Limited activity within patient's tolerance, Monitored during session, Repositioned    Home Living                          Prior Function            PT Goals (current goals can now be found in the care plan section) Progress towards PT goals: Progressing toward goals    Frequency    Min 1X/week      PT Plan      Co-evaluation              AM-PAC PT 6 Clicks Mobility   Outcome Measure  Help needed turning from your back to your side while in a flat bed without using bedrails?: A Little Help needed moving from lying on your back to sitting on the side of a flat bed without using bedrails?: A Little Help needed moving to and from a bed to a chair (including a wheelchair)?: A Lot Help needed standing up from a chair using your arms (e.g., wheelchair or bedside chair)?: A Lot Help needed to walk in hospital room?: Total Help needed climbing 3-5 steps with a railing? : Total 6 Click Score: 12    End of Session Equipment Utilized During Treatment: Gait belt Activity Tolerance: Patient tolerated treatment well Patient left: in bed;with call bell/phone within reach;with bed alarm set;with family/visitor present Nurse Communication: Mobility status;Precautions;Other (comment) PT Visit Diagnosis: Other abnormalities of gait and mobility (R26.89);Muscle weakness (generalized) (M62.81);History of falling (Z91.81);Pain Pain - Right/Left: Right Pain - part of body: Knee;Leg     Time: 9089-9047 PT Time Calculation (min) (ACUTE ONLY):  42 min  Charges:    $Therapeutic Exercise: 23-37 mins $Therapeutic Activity: 23-37 mins PT General Charges $$ ACUTE PT VISIT: 1 Visit                   Lauraine Gills, PTA 04/27/23, 10:43 AM

## 2023-04-28 DIAGNOSIS — M009 Pyogenic arthritis, unspecified: Secondary | ICD-10-CM

## 2023-04-28 DIAGNOSIS — E11628 Type 2 diabetes mellitus with other skin complications: Secondary | ICD-10-CM | POA: Diagnosis not present

## 2023-04-28 DIAGNOSIS — T8743 Infection of amputation stump, right lower extremity: Secondary | ICD-10-CM | POA: Diagnosis not present

## 2023-04-28 DIAGNOSIS — B9561 Methicillin susceptible Staphylococcus aureus infection as the cause of diseases classified elsewhere: Secondary | ICD-10-CM | POA: Diagnosis not present

## 2023-04-28 DIAGNOSIS — L089 Local infection of the skin and subcutaneous tissue, unspecified: Secondary | ICD-10-CM | POA: Diagnosis not present

## 2023-04-28 DIAGNOSIS — F322 Major depressive disorder, single episode, severe without psychotic features: Secondary | ICD-10-CM

## 2023-04-28 DIAGNOSIS — M00061 Staphylococcal arthritis, right knee: Secondary | ICD-10-CM | POA: Diagnosis not present

## 2023-04-28 LAB — BASIC METABOLIC PANEL
Anion gap: 8 (ref 5–15)
BUN: 28 mg/dL — ABNORMAL HIGH (ref 8–23)
CO2: 21 mmol/L — ABNORMAL LOW (ref 22–32)
Calcium: 8.2 mg/dL — ABNORMAL LOW (ref 8.9–10.3)
Chloride: 105 mmol/L (ref 98–111)
Creatinine, Ser: 0.9 mg/dL (ref 0.61–1.24)
GFR, Estimated: >60 mL/min (ref >60)
Glucose, Bld: 123 mg/dL — ABNORMAL HIGH (ref 70–99)
Potassium: 4.9 mmol/L (ref 3.5–5.1)
Sodium: 134 mmol/L — ABNORMAL LOW (ref 135–145)

## 2023-04-28 LAB — GLUCOSE, CAPILLARY
Glucose-Capillary: 124 mg/dL — ABNORMAL HIGH (ref 70–99)
Glucose-Capillary: 129 mg/dL — ABNORMAL HIGH (ref 70–99)
Glucose-Capillary: 114 mg/dL — ABNORMAL HIGH (ref 70–99)
Glucose-Capillary: 126 mg/dL — ABNORMAL HIGH (ref 70–99)
Glucose-Capillary: 148 mg/dL — ABNORMAL HIGH (ref 70–99)
Glucose-Capillary: 17 mg/dL — CL (ref 70–99)
Glucose-Capillary: 32 mg/dL — CL (ref 70–99)
Glucose-Capillary: 33 mg/dL — CL (ref 70–99)
Glucose-Capillary: 45 mg/dL — ABNORMAL LOW (ref 70–99)

## 2023-04-28 LAB — CBC
HCT: 23.1 % — ABNORMAL LOW (ref 39.0–52.0)
Hemoglobin: 7.5 g/dL — ABNORMAL LOW (ref 13.0–17.0)
MCH: 27.5 pg (ref 26.0–34.0)
MCHC: 32.5 g/dL (ref 30.0–36.0)
MCV: 84.6 fL (ref 80.0–100.0)
Platelets: 453 10*3/uL — ABNORMAL HIGH (ref 150–400)
RBC: 2.73 MIL/uL — ABNORMAL LOW (ref 4.22–5.81)
RDW: 16.3 % — ABNORMAL HIGH (ref 11.5–15.5)
WBC: 10.7 10*3/uL — ABNORMAL HIGH (ref 4.0–10.5)
nRBC: 0 % (ref 0.0–0.2)

## 2023-04-28 LAB — SEDIMENTATION RATE: Sed Rate: 94 mm/h — ABNORMAL HIGH (ref 0–20)

## 2023-04-28 LAB — VANCOMYCIN, PEAK: Vancomycin Pk: 20 ug/mL — ABNORMAL LOW (ref 30–40)

## 2023-04-28 LAB — C-REACTIVE PROTEIN: CRP: 7.8 mg/dL — ABNORMAL HIGH (ref <1.0)

## 2023-04-28 LAB — URIC ACID: Uric Acid, Serum: 6.4 mg/dL (ref 3.7–8.6)

## 2023-04-28 MED ORDER — DEXTROSE 50 % IV SOLN
INTRAVENOUS | Status: AC
  Administered 2023-04-28: 50 mL
  Filled 2023-04-28: qty 50

## 2023-04-28 MED ORDER — DULOXETINE HCL 20 MG PO CPEP
20.0000 mg | ORAL_CAPSULE | Freq: Two times a day (BID) | ORAL | Status: DC
  Administered 2023-04-28 – 2023-05-08 (×21): 20 mg via ORAL
  Filled 2023-04-28 (×22): qty 1

## 2023-04-28 MED ORDER — MELATONIN 5 MG PO TABS
10.0000 mg | ORAL_TABLET | Freq: Once | ORAL | Status: AC
  Administered 2023-04-28: 10 mg via ORAL
  Filled 2023-04-28: qty 2

## 2023-04-28 MED ORDER — TRAZODONE HCL 100 MG PO TABS
100.0000 mg | ORAL_TABLET | Freq: Every day | ORAL | Status: DC
  Administered 2023-04-29 – 2023-05-06 (×8): 100 mg via ORAL
  Filled 2023-04-28 (×9): qty 1

## 2023-04-28 NOTE — Progress Notes (Signed)
 Date of Admission:  04/18/2023  Manuel [name] is a 78 y.o. 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, followed by TMA in oct 2024 presented with infected TMA site Pt has complicated PAD history and foot infection    he had endarterectomies of both rt and left common, profunda and superficial femoral artery, fogary embolectomy of rt SFA, stent placement in b/l common iliac arteries, left external iliac and Rt PTA, on 11/15/22  Had wound dehiscence on the rt groin  , he underwent debridement of the wound on the rt groin on 01/09/23 with excision of 15 cm2 of skin, soft tissue, muscle fascia and application of neg pressure vac He had serratia , staph aureus and ecoli in culture and received Iv antibiotic followed by PO cipro + Augmentin  He also underwent TMA on 02/17/23 He followed with Podiatrist as OP On 12/24 he came to the ED with rt knee and leg pain after he fell out of bed. The TMA site was red , weeping and and he was started on keflex  and bactrim  and discharged from ED. Xray was done in the ED on 12/14 and it did not show any fracture.   He returned to the ED with rt leg swelling upto thigh , pain and confusion   ID: Manuel [name] is a 78 y.o. male  Principal Problem:   Osteomyelitis (HCC) Active Problems:   Hypertension   Coronary artery disease with history of myocardial infarction without history of CABG   Diabetes mellitus type 2, insulin  dependent (HCC)   Mixed hyperlipidemia   Lower limb ischemia right leg with history of angioplasty and stent on 05/14/2021   Chronic combined systolic and diastolic CHF (congestive heart failure) (HCC)   Acute kidney injury superimposed on chronic kidney disease (HCC)   Hyponatremia   Uncontrolled type 2 diabetes mellitus with hyperglycemia, with long-term current use of insulin  (HCC)   Essential hypertension   Dyslipidemia   BPH (benign prostatic hyperplasia)   Acute  metabolic encephalopathy   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)   Diabetic infection of right foot (HCC)   Wound infection    Subjective: Pt c/o poor sleep at night  Medications:   aspirin  EC  81 mg Oral Daily   atorvastatin   80 mg Oral QHS   carvedilol   25 mg Oral BID WC   clopidogrel   75 mg Oral Daily   dapagliflozin  propanediol  10 mg Oral Daily   enoxaparin  (LOVENOX ) injection  40 mg Subcutaneous Q24H   folic acid   1 mg Oral Daily   insulin  aspart  0-20 Units Subcutaneous TID WC   insulin  aspart  0-5 Units Subcutaneous QHS   insulin  aspart protamine - aspart  25 Units Subcutaneous BID WC   iron  polysaccharides  150 mg Oral Daily   mirtazapine   15 mg Oral QHS   multivitamin with minerals  1 tablet Oral Daily   nystatin   5 mL Oral QID   pregabalin   75 mg Oral BID   Ensure Max Protein  11 oz Oral Daily   sacubitril -valsartan   1 tablet Oral Q12H   spironolactone   50 mg Oral Daily   tamsulosin   0.4 mg Oral QPC supper   thiamine   100 mg Oral Daily    Objective: Vital signs in last 24 hours: Patient Vitals for the past 24 hrs:  BP Temp Temp src Pulse Resp SpO2  04/28/23 0847 (!) 95/54  98.8 F (37.1 C) Oral 71 17 92 %  04/28/23 0014 (!) 100/59 99 F (37.2 C) Oral 80 20 95 %  04/27/23 1542 (!) 115/55 98.2 F (36.8 C) -- 70 18 99 %      PHYSICAL EXAM:  General: awake and alert,  Lungs:b/l air entry Heart: s1s2 Abdomen: Soft, tendernes sleft side of abdomen Extremities: rt BKA- Rt knee mild edema- superficial abrasion  Skin: No rashes or lesions. Or bruising Neurologic: Grossly non-focal  Lab Results    Latest Ref Rng & Units 04/28/2023    5:44 AM 04/27/2023    5:11 AM 04/25/2023    4:29 AM  CBC  WBC 4.0 - 10.5 K/uL 10.7  9.6  11.9   Hemoglobin 13.0 - 17.0 g/dL 7.5  7.5  8.3   Hematocrit 39.0 - 52.0 % 23.1  22.4  25.6   Platelets 150 - 400 K/uL 453  454  468        Latest Ref Rng & Units 04/28/2023    5:44 AM 04/27/2023    5:11 AM 04/25/2023    4:29 AM   CMP  Glucose 70 - 99 mg/dL 876  57  893   BUN 8 - 23 mg/dL 28  23  26    Creatinine 0.61 - 1.24 mg/dL 9.09  9.24  9.16   Sodium 135 - 145 mmol/L 134  135  134   Potassium 3.5 - 5.1 mmol/L 4.9  4.2  4.1   Chloride 98 - 111 mmol/L 105  109  105   CO2 22 - 32 mmol/L 21  22  18    Calcium  8.9 - 10.3 mg/dL 8.2  8.1  8.2       Microbiology: Synovial fluid- no organisms seen ( was previously reported as gram positive cocci- spoke to lab Culture neg so far    Assessment/Plan:  ?Rt TMA site infection- underwent BKA Wound culture was MSSA   Rt knee septic arthritis- s/p I/D culture NG so far  On ceftriaxone  and vanco- - culture no growth - continue both antibiotics by IV for a minimum of 2 weeks ( 05/06/23)  and then can switch to PO cefudroxil + BActrim    DM- management as per primary team   PAD-- h/o endarterectomies of both rt and left common, profunda and superficial femoral artery, fogary embolectomy of rt SFA, stent placement in b/l common iliac arteries, left external iliac and Rt PTA, on 11/15/22    AKI- resolved   Anemia   Intermittent and chronic  left sided abdominal pain-management as per hospitalist In 2023 Ct  showed colonic diverticulosis, saccular aneursym of 2.3 cm and superior end plate fracture of l1  Discussed the management with  patient and wife

## 2023-04-28 NOTE — Progress Notes (Signed)
 Inpatient Rehab Admissions Coordinator:    I continue to await insurance auth for CIR. May need SNF if insurance denies.  Megan Salon, MS, CCC-SLP Rehab Admissions Coordinator  [phone] (celll) [phone] (office)

## 2023-04-28 NOTE — Consult Note (Signed)
 [facility] Psychiatric Consult Initial  Patient Name: .Manuel [name]  MRN: [id number]  DOB: 1946-04-08  Consult Order details:  Orders (From admission, onward)     Start     Ordered   04/28/23 0203  IP CONSULT TO PSYCHIATRY       Ordering Provider: Jawo, Modou L, NP  Provider:  (Not yet assigned)  Question Answer Comment  Location [facility]   Reason for Consult? SI      04/28/23 0202            Mode of Visit: In person   Psychiatry Consult Evaluation  Service Date: April 28, 2023 LOS:  LOS: 9 days  Chief Complaint  I brought it up for a reason  Primary Psychiatric Diagnoses  MDD   Assessment  Manuel [name] is a 78 y.o. male w/ history of osteoarthritis, coronary artery disease, essential hypertension, carotid artery disease, DDD, type 2 diabetes mellitus, gout, peripheral neuropathy who was medically admitted on 04/19/23 after presenting to the emergency department on 04/18/23 w/ AMS and wound infection. 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 Underwent R knee I&D for septic/gouty arthritis on 12/30. Psychiatry consulted for SI. Per available notes, pt reported to RN earlier this morning that he wants to die, wants to kill himself, has a suicide note in mind that he intends to write and leave for his wife. Pt is currently on a 1:1.  On assessment today, pt endorses worsening depression, suicidal ideations x2years. Endorses anhedonia, amotivation, poor sleep and appetite. Endorses multiple suicidal plans, including overdosing on aspirin , cutting himself. Reports he has thoughts of writing a suicide note to his wife telling her to call the police so that she will not have to see his body. Spoke w/ pt's wife who reports concerns regarding pt's safety. Have discussed with pt and pt's wife treatment plan to include IVC and recommendation for inpatient psychiatric admission. Discussed medication changes (see below). Pt and pt's wife  verbalized understanding of and agreement with treatment plan. Opportunity was provided to answer questions and questions were answered.  Diagnoses:  Active Hospital problems: Principal Problem:   Osteomyelitis (HCC) Active Problems:   Hypertension   Coronary artery disease with history of myocardial infarction without history of CABG   Diabetes mellitus type 2, insulin  dependent (HCC)   Mixed hyperlipidemia   Lower limb ischemia right leg with history of angioplasty and stent on 05/14/2021   Chronic combined systolic and diastolic CHF (congestive heart failure) (HCC)   Acute kidney injury superimposed on chronic kidney disease (HCC)   Hyponatremia   Uncontrolled type 2 diabetes mellitus with hyperglycemia, with long-term current use of insulin  (HCC)   Essential hypertension   Dyslipidemia   BPH (benign prostatic hyperplasia)   Acute metabolic encephalopathy   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 Cam: -- Will discontinue remeron  15mg  oral daily at bedtime -- MDD and Insomnia: Increase trazodone  from 25mg  to 100mg  and change from PRN to scheduled oral daily at bedtime.  -- MDD: Start duloxetine  20mg  oral 2 times daily   ## Medical Decision Making Capacity: Not specifically addressed in this encounter  ## Further Work-up:  -- Defer to primary team  ## Disposition:-- We recommend inpatient psychiatric hospitalization after medical hospitalization. Patient has been involuntarily committed  on 04/28/23.   ## Behavioral / Environmental: -Utilize compassion and acknowledge the patient's experiences while setting clear and realistic expectations for care.   ## Safety and Observation Level:  - At this time, we recommend  1:1 Observation. This decision is based on my review of the chart including patient's  history and current presentation, interview of the patient, mental status examination, and consideration of suicide risk including evaluating suicidal ideation, plan, intent, suicidal or self-harm behaviors, risk factors, and protective factors. This judgment is based on our ability to directly address suicide risk, implement suicide prevention strategies, and develop a safety plan while the patient is in the clinical setting. Please contact our team if there is a concern that risk level has changed.  CSSR Risk Category:C-SSRS RISK CATEGORY: High Risk  Suicide Risk Assessment: Patient has following modifiable risk factors for suicide: active suicidal ideation and under treated depression . Patient has following non-modifiable or demographic risk factors for suicide: male gender Patient has the following protective factors against suicide: Supportive family, Supportive friends, Frustration tolerance, no history of suicide attempts, and no history of NSSIB  Thank you for this consult request. Recommendations have been communicated to the primary team. We will place under IVC and recommend inpatient psychiatric admission at this time.   [name], NP      History of Present Illness  Patient Report:  Pt verbalizes understanding of reasoning for psychiatry consult. States I brought it [suicidal ideation] up for a reason. Reports he has been experiencing worsening depression and suicidal ideation for the past 2 years. Reports primary stressor has been his health. 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 He endorses anhedonia, amotivation, poor appetite and sleep. Is concerned he is a burden to his family. Endorses suicidal ideations with multiple plans, including overdosing on aspirin , cutting himself. Reports he has thoughts of writing a suicide note to his wife telling her to call the police so that she will not  have to see his body. He denies homicidal ideations. He denies auditory visual hallucinations or paranoia. He denies past history of mania/hypomania or psychosis.  Psych ROS:  +worsening depression x2 years +worsening suicidal ideations x2years w/ multiple plans including overdosing on aspirin , cutting himself +poor appetite +poor sleep -homicidal ideations -auditory visual hallucinations or paranoia  Collateral information:  Spoke w/ pt's wife, Evlyn, who reports pt has history of depression. Feels it has been getting worse. Has made suicidal statements in the past, although now occurring with greater frequency. She is concerned about pt's safety. States pt has good support with family and friends.   Review of Systems  Constitutional:  Negative for chills and fever.  Respiratory:  Negative for shortness of breath.   Cardiovascular:  Negative for chest pain.  Psychiatric/Behavioral:  Positive for depression and suicidal ideas. The patient has insomnia.     Psychiatric and Social History  Psychiatric History:   Prev Dx/Sx: history of depression  Current Psych Provider: denies  Home Meds (current): denies current psychotropics although current orders include remeron  15mg  oral daily at bedtime and trazodone  25mg  oral prn daily at bedtime Previous Med Trials: previously took antidepressant for less than a week, he is not sure of the name/dose of the antidepressant Therapy: not currently in counseling  Prior Psych Hospitalization: denies prior psych hospitalization  Prior Self Harm: denies history of suicide attempt or non suicidal self injurious behavior  Family Psych History:  nephew depression Family Hx suicide: nephew died by suicide by hanging  Social History:  Educational Hx: 1 year of college 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 Legal Hx: No pending charges or court dates Living Situation: Living with wife, Evlyn, in [place]  Substance History Endorses use  of alcohol  daily prior to admission, drinking 1/2 gallon of canadian whiskey/week. Denies history of Dts or withdrawal seizures. Denies experiencing withdrawal sx. Has been without alcohol  for 10 days now.   Endorses use of marijuana twice/week to once/month, several puffs/occasion.   Denies use of nicotine, crack/cocaine, methamphetamines, other substances.  Exam Findings  Vital Signs:  Temp:  [98.2 F (36.8 C)-99 F (37.2 C)] 98.8 F (37.1 C) (01/06 0847) Pulse Rate:  [70-80] 71 (01/06 0847) Resp:  [17-20] 17 (01/06 0847) BP: (95-115)/(54-59) 95/54 (01/06 0847) SpO2:  [92 %-99 %] 92 % (01/06 0847) Blood pressure (!) 95/54, pulse 71, temperature 98.8 F (37.1 C), temperature source Oral, resp. rate 17, height 5' 8 (1.727 m), weight 83.9 kg, SpO2 92%. Body mass index is 28.12 kg/m.  Physical Exam Constitutional:      General: He is not in acute distress. Cardiovascular:     Rate and Rhythm: Normal rate.  Pulmonary:     Effort: Pulmonary effort is normal. No respiratory distress.  Neurological:     Mental Status: He is alert and oriented to person, place, and time.  Psychiatric:        Attention and Perception: Attention and perception normal.        Mood and Affect: Mood is anxious and depressed. Affect is blunt.        Speech: Speech normal.        Behavior: Behavior normal. Behavior is cooperative.        Thought Content: Thought content is not paranoid or delusional. Thought content includes suicidal ideation. Thought content does not include homicidal ideation. Thought content includes suicidal plan.        Cognition and Memory: Cognition and memory normal.        Judgment: Judgment is inappropriate.    Mental Status Exam: General Appearance: Disheveled  Orientation:  Full (Time, Place, and Person)  Memory:  Immediate;   Fair  Concentration:  Concentration: Fair and Attention Span: Fair  Recall:  Fair  Attention  Fair  Eye Contact:  Minimal  Speech:  Clear and  Coherent  Language:  Fair  Volume:  Normal  Mood: depressed  Affect:  Blunt  Thought Process:  Coherent, Goal Directed, and Linear  Thought Content:  Logical  Suicidal Thoughts:  Yes.  with intent/plan  Homicidal Thoughts:  No  Judgement:  Poor  Insight:  Shallow  Psychomotor Activity:   sitting up on assessment  Akathisia:  No  Fund of Knowledge:  Fair   Assets:  Manufacturing Systems Engineer Desire for Improvement Financial Resources/Insurance Housing Intimacy Leisure Time Resilience Social Support  Cognition:  WNL  ADL's:  Impaired  AIMS (if indicated):      Other History   These have been pulled in through the EMR, reviewed, and updated if appropriate.   Family History:  The patient's family history includes Heart disease in his father; Scoliosis in his mother.  Medical History: Past Medical History:  Diagnosis Date   Acute ST elevation myocardial infarction (STEMI) of inferior wall (HCC) 04/19/2010   a.) transfered from [facility] to [facility] --> LHC/PCI (very difficult procedure) --> 3.0 x 23 mm and 3.0 x 12 mm Xience stents to RCA  Allergies    Arthritis    Benign essential hypertension    Bilateral carotid artery disease (HCC) 05/08/2021   a.) carotid doppler 05/08/2021: 1-39% BICA   CAD (coronary artery disease) 04/19/2010   a.) inferior STEMI 04/19/2010 --> LHC/PCI: 50-70% pD1, 80% pRI, 90/90/90% RCA (overlapping 3.0 x 23 and 3.0 x 12 mm Xience DES); b.) MV 11/10/2018: fixed minimally reversible inferior perfusion defect   Cellulitis of foot    DDD (degenerative disc disease), cervical    Diabetes mellitus type 2, insulin  dependent (HCC)    Diverticulosis    Full dentures    Gout    Hard of hearing    History of bilateral cataract extraction 2022   History of ETOH abuse    Hyperlipidemia    Ischemic cardiomyopathy 04/19/2010   a.) TTE 04/19/2010: 40%; b.) TTE 04/20/2014: EF >55%, mild RVE, triv PR, mild MR/TR, G1DD; c.) TTE 11/10/2018: EF 45%, inf HK, mild RVE, triv  TR/PR, mild MR, G1DD; d.) TTE 11/14/2022: EF 25-30%, basal anteroseptal, apical lateral, apical septal, and apex AK, mid anterolateral HK, mild-mod MR/TR   Long term current use of aspirin     Long term current use of clopidogrel     Lumbar degenerative disc disease    Lumbar radiculopathy    Lumbar vertebral fracture (chronic superior endplate of L1)    OSA (obstructive sleep apnea)    a.) unable to tolerate nocturnal PAP therapy   Peripheral artery disease (HCC)    a.) stenting 05/14/21: 12 mm x 12 cm LifeStent RIGHT dis SFA/prox pop; b.) s/p cath directed thrombolysis RIGHT SFA/pop 06/14/21; c.) s/p mech thrombectomy + stenting 06/15/21: 8 mm x 25cm & 8 mm x 7.5cm Viabahn; d.) s/p BILAT CFA, profunda femoris, SFA endarterectomies + fogarty embolectomy + stenting 11/15/22: 12mm x 58mm Lifestream BILAT CIAs, 14 mm x 6 cm Lifestream & 13 mm x 5 cm Viabahn LEFT EIA   Peripheral neuropathy    Umbilical hernia    Surgical History: Past Surgical History:  Procedure Laterality Date   AMPUTATION Right 11/18/2022   Procedure: AMPUTATION 4TH AND 5TH RAY;  Surgeon: [name], [name];  Location: [facility];  Service: Orthopedics/Podiatry;  Laterality: Right;  4th and 5th toe   AMPUTATION Right 04/20/2023   Procedure: AMPUTATION BELOW KNEE;  Surgeon: [name], MD;  Location: [facility];  Service: Vascular;  Laterality: Right;  block then general   APPLICATION OF WOUND VAC Right 01/09/2023   Procedure: APPLICATION OF WOUND VAC;  Surgeon: [name], MD;  Location: [facility];  Service: Vascular;  Laterality: Right;   CATARACT EXTRACTION W/PHACO Right 03/14/2021   Procedure: CATARACT EXTRACTION PHACO AND INTRAOCULAR LENS PLACEMENT (IOC) RIGHT DIABETIC;  Surgeon: [name], MD;  Location: [facility];  Service: Ophthalmology;  Laterality: Right;  Diabetic 16.78 01:39.9   CATARACT EXTRACTION W/PHACO Left 03/28/2021   Procedure: CATARACT EXTRACTION PHACO AND INTRAOCULAR LENS PLACEMENT (IOC)  LEFT DIABETIC 6.93 01:22.0;  Surgeon: [name], MD;  Location: [facility];  Service: Ophthalmology;  Laterality: Left;  Diabetic   COLONOSCOPY     CORONARY ANGIOPLASTY WITH STENT PLACEMENT  03/2010   Procedure: CORONARY ANGIOPLASTY WITH STENT PLACEMENT; Location: [name]   ENDARTERECTOMY FEMORAL Bilateral 11/15/2022   Procedure: BILATERAL COMMON FEMORAL PROFUNDA FEMORIS AND SUPERFICIAL FEMORAL ARTERY ENDARTECTOMIES, RIGHT FOGARTY EMBOLECTOMY OF THE RIGHT SFA  AND  POPLITEAL ARTERIES. AORTAGRAM AND RIGHT LOWER EXTREMITY ANGIOGRAM.;  Surgeon: [name], MD;  Location: [facility];  Service: Vascular;  Laterality:  Bilateral;   INSERTION OF ILIAC STENT Bilateral 11/15/2022   Procedure: BILATERAL STENT INSERTION IN BILATERAL  COMMON ILIAC ARTERY, STENT INSERTION OF LEFT EXTERNAL ILIAC ARTERY. ANGIOPLASTY RIGHT TIBIAL  AND POPLITEAL ARTERY.;  Surgeon: [name], MD;  Location: [facility];  Service: Vascular;  Laterality: Bilateral;   IRRIGATION AND DEBRIDEMENT KNEE Right 04/21/2023   Procedure: IRRIGATION AND DEBRIDEMENT KNEE;  Surgeon: Zafonte, Brian Janes, MD;  Location: [facility];  Service: Orthopedics;  Laterality: Right;   LOWER EXTREMITY ANGIOGRAPHY Right 05/14/2021   Procedure: LOWER EXTREMITY ANGIOGRAPHY;  Surgeon: [name], MD;  Location: [facility];  Service: Cardiovascular;  Laterality: Right;   LOWER EXTREMITY ANGIOGRAPHY Right 06/14/2021   Procedure: Lower Extremity Angiography;  Surgeon: [name], MD;  Location: [facility];  Service: Cardiovascular;  Laterality: Right;   LOWER EXTREMITY ANGIOGRAPHY Right 11/11/2022   Procedure: Lower Extremity Angiography;  Surgeon: [name], MD;  Location: [facility];  Service: Cardiovascular;  Laterality: Right;   LOWER EXTREMITY INTERVENTION Right 06/15/2021   Procedure: LOWER EXTREMITY INTERVENTION;  Surgeon: [name], MD;  Location: [facility];  Service: Cardiovascular;   Laterality: Right;   TONSILLECTOMY     TRANSMETATARSAL AMPUTATION Right 02/17/2023   Procedure: TRANSMETATARSAL AMPUTATION;  Surgeon: Lennie Barter, [name];  Location: [facility];  Service: Orthopedics/Podiatry;  Laterality: Right;   WOUND DEBRIDEMENT Right 01/09/2023   Procedure: DEBRIDEMENT WOUND;  Surgeon: [name], MD;  Location: [facility];  Service: Vascular;  Laterality: Right;   Medications:   Current Facility-Administered Medications:    acetaminophen  (TYLENOL ) tablet 650 mg, 650 mg, Oral, Q6H PRN, 650 mg at 04/24/23 1145 **OR** acetaminophen  (TYLENOL ) suppository 650 mg, 650 mg, Rectal, Q6H PRN, Zafonte, Redell Ned, MD   aspirin  EC tablet 81 mg, 81 mg, Oral, Daily, Zafonte, Redell Ned, MD, 81 mg at 04/28/23 0913   atorvastatin  (LIPITOR ) tablet 80 mg, 80 mg, Oral, QHS, Zafonte, Redell Ned, MD, 80 mg at 04/27/23 2138   carvedilol  (COREG ) tablet 25 mg, 25 mg, Oral, BID WC, Zafonte, Redell Ned, MD, 25 mg at 04/27/23 1803   cefTRIAXone  (ROCEPHIN ) 2 g in sodium chloride  0.9 % 100 mL IVPB, 2 g, Intravenous, Q24H, Ravishankar, Jayashree, MD, Last Rate: 200 mL/hr at 04/27/23 1539, 2 g at 04/27/23 1539   clopidogrel  (PLAVIX ) tablet 75 mg, 75 mg, Oral, Daily, Zafonte, Redell Ned, MD, 75 mg at 04/28/23 9085   dapagliflozin  propanediol (FARXIGA ) tablet 10 mg, 10 mg, Oral, Daily, Zafonte, Redell Ned, MD, 10 mg at 04/28/23 9085   DULoxetine  (CYMBALTA ) DR capsule 20 mg, 20 mg, Oral, BID, Norita Meigs Eun, NP   enoxaparin  (LOVENOX ) injection 40 mg, 40 mg, Subcutaneous, Q24H, Zafonte, Redell Ned, MD, 40 mg at 04/28/23 0913   folic acid  (FOLVITE ) tablet 1 mg, 1 mg, Oral, Daily, Zafonte, Redell Ned, MD, 1 mg at 04/28/23 0913   guaiFENesin  (MUCINEX ) 12 hr tablet 600 mg, 600 mg, Oral, BID PRN, Barbarann Nest, MD, 600 mg at 04/24/23 1620   hydrALAZINE  (APRESOLINE ) tablet 25 mg, 25 mg, Oral, Q6H PRN, Zafonte, Redell Ned, MD   insulin  aspart (novoLOG ) injection 0-20 Units, 0-20 Units,  Subcutaneous, TID WC, Zafonte, Redell Ned, MD, 3 Units at 04/28/23 0844   insulin  aspart (novoLOG ) injection 0-5 Units, 0-5 Units, Subcutaneous, QHS, Zafonte, Redell Ned, MD, 2 Units at 04/20/23 2215   insulin  aspart protamine - aspart (NOVOLOG  MIX 70/30) injection 25 Units, 25 Units, Subcutaneous, BID WC, Vann, Jessica U,  DO, 25 Units at 04/28/23 9085   iron  polysaccharides (NIFEREX) capsule 150 mg, 150 mg, Oral, Daily, Zafonte, Redell Ned, MD, 150 mg at 04/28/23 9085   menthol -cetylpyridinium (CEPACOL) lozenge 3 mg, 1 lozenge, Oral, PRN, Barbarann Nest, MD   morphine  (PF) 2 MG/ML injection 2 mg, 2 mg, Intravenous, Q2H PRN, Barbarann Nest, MD, 2 mg at 04/28/23 1327   multivitamin with minerals tablet 1 tablet, 1 tablet, Oral, Daily, Zafonte, Redell Ned, MD, 1 tablet at 04/28/23 9086   nystatin  (MYCOSTATIN ) 100000 UNIT/ML suspension 500,000 Units, 5 mL, Oral, QID, Barbarann Nest, MD, 500,000 Units at 04/28/23 9085   ondansetron  (ZOFRAN ) tablet 4 mg, 4 mg, Oral, Q6H PRN **OR** ondansetron  (ZOFRAN ) injection 4 mg, 4 mg, Intravenous, Q6H PRN, Zafonte, Redell Ned, MD   Oral care mouth rinse, 15 mL, Mouth Rinse, PRN, Barbarann Nest, MD   oxyCODONE -acetaminophen  (PERCOCET/ROXICET) 5-325 MG per tablet 1 tablet, 1 tablet, Oral, Q6H PRN, Zafonte, Redell Ned, MD, 1 tablet at 04/28/23 0428   pregabalin  (LYRICA ) capsule 75 mg, 75 mg, Oral, BID, Zafonte, Redell Ned, MD, 75 mg at 04/28/23 0913   protein supplement (ENSURE MAX) liquid, 11 oz, Oral, Daily, Zafonte, Redell Ned, MD, 11 oz at 04/28/23 1320   sacubitril -valsartan  (ENTRESTO ) 24-26 mg per tablet, 1 tablet, Oral, Q12H, Barbarann Nest, MD, 1 tablet at 04/28/23 0914   senna-docusate (Senokot-S) tablet 1 tablet, 1 tablet, Oral, QHS PRN, Zafonte, Brian Tion, MD, 1 tablet at 04/22/23 1654   simethicone  (MYLICON) chewable tablet 80 mg, 80 mg, Oral, QID PRN, Barbarann Nest, MD, 80 mg at 04/24/23 1145   spironolactone  (ALDACTONE ) tablet 50  mg, 50 mg, Oral, Daily, Barbarann Nest, MD, 50 mg at 04/28/23 9086   tamsulosin  (FLOMAX ) capsule 0.4 mg, 0.4 mg, Oral, QPC supper, Zafonte, Redell Ned, MD, 0.4 mg at 04/27/23 1803   thiamine  (VITAMIN B1) tablet 100 mg, 100 mg, Oral, Daily, Zafonte, Redell Ned, MD, 100 mg at 04/28/23 0913   traZODone  (DESYREL ) tablet 100 mg, 100 mg, Oral, QHS, Jahari Billy Eun, NP   vancomycin  (VANCOREADY) IVPB 2000 mg/400 mL, 2,000 mg, Intravenous, Q24H, Ravishankar, Donald, MD, Last Rate: 200 mL/hr at 04/28/23 0603, 2,000 mg at 04/28/23 9396  Allergies: No Known Allergies  [name], NP

## 2023-04-28 NOTE — Progress Notes (Signed)
       CROSS COVER NOTE  NAME: Manuel [name] MRN: [id number] DOB : 07/19/1945    Date of Service   04/28/2023   HPI/Events of Note   Nurse paged because patient reported that he wants to die. Pt says that he has a suicide note in mind that he intends to write and leave for his wife.  Chart review showed that patient has had multiple instances of suicidal thoughts.  Interventions   -Suicide precautions was ordered by this NP and implemented by care nurse. -Inpatient psych consult placed.  Please follow this a.m.     Tamika Nou Lamin Jenavee Laguardia, MSN, APRN, AGACNP-BC [facility] [place] Pager: [phone]. Check Amion for Availability

## 2023-04-28 NOTE — Progress Notes (Signed)
 Pt requests stronger sleep medicine than Trazodone that had been administered. Pt states he has no pain. RN notified NP.

## 2023-04-28 NOTE — Progress Notes (Addendum)
 Hypoglycemic Event  CBG: 32  Treatment: 8 oz juice/soda and food (given by Charge Nurse)  Symptoms:  pt sleeping  Follow-up CBG: Time:2253 CBG Result:33  Possible Reasons for Event: Inadequate meal intake  Comments/MD notified:Jawo, NP    Hassie Cramp

## 2023-04-28 NOTE — Progress Notes (Signed)
 Pharmacy Antibiotic Note  Manuel [name] is a 78 y.o. male w/ PMH of OA, CAD, HTN, DDD, DM, gout, HLD, OSA, peripheral neuropathy admitted on 04/18/2023 with cellulitis.  Pharmacy has been consulted for vancomycin  dosing. Patient with infection at previous TMA site s/p BKA on 12/29 then aspiration of knee revealed elevated WBCs and had washout of knee on 12/30  Today, 04/28/2023 Day #10 vancomycin  and ceftriaxone   Renal: SCr 0.90 (stable) WBC 10.7 Afebrile 12/28 blood culture: NG-Final 12/28 R foot culture: MSSA 12/29 synovial fluid knee: NG 12/30 Swab knee synovial fluid: NG  Vancomycin  levels:  On vancomycin  2gm IV q24h (previous dose given 1/6 at 06:03) Vanco peak 1/6 at 08:33 = 20 mcg/ml  Vanco trough 1/7   Plan:  Vancomycin  2000 mg IV every 24 hours with levels ordered with this morning's dose eAUC 519.1, Cmin 10.4, Scr 0.81, IBW used, Vd 0.72 L/kg Pharmacy will continue to follow and will adjust abx dosing as warranted. F/u renal function ID following - planning two weeks of IV therapy   Temp (24hrs), Avg:98.7 F (37.1 C), Min:98.2 F (36.8 C), Max:99 F (37.2 C)   Recent Labs  Lab 04/23/23 0417 04/24/23 0417 04/25/23 0429 04/27/23 0511 04/28/23 0544 04/28/23 0833  WBC 11.0* 10.8* 11.9* 9.6 10.7*  --   CREATININE 0.81 0.85 0.83 0.75 0.90  --   VANCOPEAK  --   --   --   --   --  20*    Estimated Creatinine Clearance: 70.2 mL/min (by C-G formula based on SCr of 0.9 mg/dL).    No Known Allergies  Antimicrobials this admission: 12/28 cefepime  >> x 1 dose 12/28 vancomycin  >>  12/28 ceftriaxone  >>   Microbiology results: 12/28 BCx: NGTD 12/28 WCx: moderate MSSA MRSA PCR negative  Thank you for involving pharmacy in this patient's care.   Tayra Dawe, PharmD, BCPS, BCIDP Work Cell: [phone] 04/28/2023 12:56 PM

## 2023-04-28 NOTE — Plan of Care (Addendum)
  Problem: Fluid Volume: Goal: Hemodynamic stability will improve Outcome: Progressing   Problem: Clinical Measurements: Goal: Diagnostic test results will improve Outcome: Progressing Goal: Signs and symptoms of infection will decrease Outcome: Progressing   Problem: Respiratory: Goal: Ability to maintain adequate ventilation will improve Outcome: Progressing   Problem: Education: Goal: Ability to describe self-care measures that may prevent or decrease complications (Diabetes Survival Skills Education) will improve Outcome: Progressing Goal: Individualized Educational Video(s) Outcome: Progressing   Problem: Fluid Volume: Goal: Ability to maintain a balanced intake and output will improve Outcome: Progressing   Problem: Health Behavior/Discharge Planning: Goal: Ability to identify and utilize available resources and services will improve Outcome: Progressing Goal: Ability to manage health-related needs will improve Outcome: Progressing   Problem: Metabolic: Goal: Ability to maintain appropriate glucose levels will improve Outcome: Progressing   Problem: Nutritional: Goal: Maintenance of adequate nutrition will improve Outcome: Progressing Goal: Progress toward achieving an optimal weight will improve Outcome: Progressing   Problem: Skin Integrity: Goal: Risk for impaired skin integrity will decrease Outcome: Progressing   Problem: Tissue Perfusion: Goal: Adequacy of tissue perfusion will improve Outcome: Progressing   Problem: Education: Goal: Knowledge of General Education information will improve Description: Including pain rating scale, medication(s)/side effects and non-pharmacologic comfort measures Outcome: Progressing   Problem: Health Behavior/Discharge Planning: Goal: Ability to manage health-related needs will improve Outcome: Progressing   Problem: Clinical Measurements: Goal: Ability to maintain clinical measurements within normal limits will  improve Outcome: Progressing Goal: Will remain free from infection Outcome: Progressing Goal: Diagnostic test results will improve Outcome: Progressing Goal: Respiratory complications will improve Outcome: Progressing Goal: Cardiovascular complication will be avoided Outcome: Progressing   Problem: Activity: Goal: Risk for activity intolerance will decrease Outcome: Progressing   Problem: Nutrition: Goal: Adequate nutrition will be maintained Outcome: Progressing   Problem: Elimination: Goal: Will not experience complications related to bowel motility Outcome: Progressing Goal: Will not experience complications related to urinary retention Outcome: Progressing   Problem: Pain Management: Goal: General experience of comfort will improve Outcome: Progressing   Problem: Safety: Goal: Ability to remain free from injury will improve Outcome: Progressing   Problem: Skin Integrity: Goal: Risk for impaired skin integrity will decrease Outcome: Progressing

## 2023-04-28 NOTE — Progress Notes (Addendum)
 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 Wife at bedside this morning. Patient is experiencing more pain in the right knee this morning. Planning for discharge to CIR at this time. Negative for chest pain and shortness of breath Fever:  No recent fevers. Gastrointestinal:Negative for nausea and vomiting Patient has had a BM.  Objective: Vital signs in last 24 hours: Temp:  [98.2 F (36.8 C)-99 F (37.2 C)] 98.8 F (37.1 C) (01/06 0847) Pulse Rate:  [70-80] 71 (01/06 0847) Resp:  [17-20] 17 (01/06 0847) BP: (95-115)/(54-59) 95/54 (01/06 0847) SpO2:  [92 %-99 %] 92 % (01/06 0847)  Intake/Output from previous day:  Intake/Output Summary (Last 24 hours) at 04/28/2023 0953 Last data filed at 04/27/2023 2300 Gross per 24 hour  Intake 240 ml  Output 1350 ml  Net -1110 ml    Intake/Output this shift: No intake/output data recorded.  Labs: Recent Labs    04/27/23 0511 04/28/23 0544  HGB 7.5* 7.5*   Recent Labs    04/27/23 0511 04/28/23 0544  WBC 9.6 10.7*  RBC 2.60* 2.73*  HCT 22.4* 23.1*  PLT 454* 453*   Recent Labs    04/27/23 0511 04/28/23 0544  NA 135 134*  K 4.2 4.9  CL 109 105  CO2 22 21*  BUN 23 28*  CREATININE 0.75 0.90  GLUCOSE 57* 123*  CALCIUM  8.1* 8.2*   No results for input(s): LABPT, INR in the last 72 hours.  EXAM General - Patient is resting but does appear in more discomfort this AM. Extremity - ACE Wrap applied to the right lower extremity this morning. No drainage noted to the ACE wrap this AM.  Exposed knee incision without drainage or erythema.  Minimal swelling is noted.  No increase in warmth with palpation. More discomfort with palpation around the right knee this AM compared to yesterday. Abdomen soft to palpation.  Past Medical History:  Diagnosis Date   Acute ST elevation myocardial infarction (STEMI) of inferior  wall (HCC) 04/19/2010   a.) trans[facility] to [facility] --> LHC/PCI (very difficult procedure) --> 3.0 x 23 mm and 3.0 x 12 mm Xience stents to RCA   Allergies    Arthritis    Benign essential hypertension    Bilateral carotid artery disease (HCC) 05/08/2021   a.) carotid doppler 05/08/2021: 1-39% BICA   CAD (coronary artery disease) 04/19/2010   a.) inferior STEMI 04/19/2010 --> LHC/PCI: 50-70% pD1, 80% pRI, 90/90/90% RCA (overlapping 3.0 x 23 and 3.0 x 12 mm Xience DES); b.) MV 11/10/2018: fixed minimally reversible inferior perfusion defect   Cellulitis of foot    DDD (degenerative disc disease), cervical    Diabetes mellitus type 2, insulin  dependent (HCC)    Diverticulosis    Full dentures    Gout    Hard of hearing    History of bilateral cataract extraction 2022   History of ETOH abuse    Hyperlipidemia    Ischemic cardiomyopathy 04/19/2010   a.) TTE 04/19/2010: 40%; b.) TTE 04/20/2014: EF >55%, mild RVE, triv PR, mild MR/TR, G1DD; c.) TTE 11/10/2018: EF 45%, inf HK, mild RVE, triv TR/PR, mild MR, G1DD; d.) TTE 11/14/2022: EF 25-30%, basal anteroseptal, apical lateral, apical septal, and apex AK, mid anterolateral HK, mild-mod MR/TR   Long term current use of aspirin     Long term current use of clopidogrel     Lumbar degenerative disc disease    Lumbar  radiculopathy    Lumbar vertebral fracture (chronic superior endplate of L1)    OSA (obstructive sleep apnea)    a.) unable to tolerate nocturnal PAP therapy   Peripheral artery disease (HCC)    a.) stenting 05/14/21: 12 mm x 12 cm LifeStent RIGHT dis SFA/prox pop; b.) s/p cath directed thrombolysis RIGHT SFA/pop 06/14/21; c.) s/p mech thrombectomy + stenting 06/15/21: 8 mm x 25cm & 8 mm x 7.5cm Viabahn; d.) s/p BILAT CFA, profunda femoris, SFA endarterectomies + fogarty embolectomy + stenting 11/15/22: 12mm x 58mm Lifestream BILAT CIAs, 14 mm x 6 cm Lifestream & 13 mm x 5 cm Viabahn LEFT EIA   Peripheral neuropathy    Umbilical  hernia    Assessment/Plan: 7 Days Post-Op Procedure(s) (LRB): IRRIGATION AND DEBRIDEMENT KNEE (Right) Principal Problem:   Osteomyelitis (HCC) Active Problems:   Hypertension   Coronary artery disease with history of myocardial infarction without history of CABG   Diabetes mellitus type 2, insulin  dependent (HCC)   Mixed hyperlipidemia   Lower limb ischemia right leg with history of angioplasty and stent on 05/14/2021   Chronic combined systolic and diastolic CHF (congestive heart failure) (HCC)   Acute kidney injury superimposed on chronic kidney disease (HCC)   Hyponatremia   Uncontrolled type 2 diabetes mellitus with hyperglycemia, with long-term current use of insulin  (HCC)   Essential hypertension   Dyslipidemia   BPH (benign prostatic hyperplasia)   Acute metabolic encephalopathy   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   Weight as of this encounter: 83.9 kg. Up with therapy as tolerated.  No recent fevers. Hemoglobin at 7.5 this AM.  Continue to monitor. WBC up to 10.7 this AM.  WIll obtain a ESR and CRP this morning.  Uric acid has also been obtained this AM. No growth noted on gram stain and culture. Continue on Rocephin  and Vacomycin per infectious disease. WIll defer to vascular for wound changes to the right BKA. Per Locums, staples to be removed in 2-3 weeks.  DVT Prophylaxis - Lovenox  and Plavix  NWB to right lower extremity.  DOROTHA Gustavo Level, PA-C, CAQ-OS [name] Orthopaedic Surgery 04/28/2023, 9:53 AM

## 2023-04-28 NOTE — Progress Notes (Signed)
 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  He then underwent R knee I&D for septic/gouty arthritis on 12/30.  He will need SNF rehab.  Vascular surgery is recommending ASA 81 mg daily + Plavix  and Lipitor .  ID is consulting for antibiotic recommendations.  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/29 Also with septic knee with gouty arthritis and so underwent R knee I&D on 12/30; knee with MSSA so stopped colchicine  (resume if needed) Wound cultures with staph aureus, MSSA with resistance to Cipro , Clinda, Erythromycin, Tetracycline Started empirically on IV vancomycin  and Ceftriaxone ; will need IV abx through 1/14 and then PO due to septic arthritis, per ID Blood cultures negative x 5 days He appears likely to need CIR and PT/OT/PM&R MD are in agreement; however, he developed severe depression with SI and now appears to need inpatient psychiatric support instead   Intermittent L side pain Wife reports that this is a chronic issue but seems to worsen after surgeries There is no obvious pattern to the pain Continue to monitor without intervention for now but consider imaging should pain worsen or become more consistent  Suicidal ideation This has been an issue in the past, as well Psychiatry consulted Currently on precautions He is recommended for geropsych inpatient Sitter present Need for IV antibiotics may limit his options for bed  placement   Acute on chronic hyponatremia Appears to be a chronic issue, stable   Chronic systolic heart failure with a last EF of 30% in October 2025/ischemic cardiomyopathy Continue Coreg , Farxiga  Held Entresto , torsemide  and Aldactone  due to acute kidney injury Resume Entresto  and Aldactone  for now Consider restarting torsemide  soon    Acute metabolic encephalopathy Likely multifactorial in the setting of hypovolemia and infectious etiology Wife raises some concern for possible dementia Added delirium precautions   Uncontrolled type 2 diabetes mellitus with hyperglycemia, with long-term current use of insulin   A1c was 6.9 in 10/2022 Continue NovoLog  70/30, 30 units twice a day, plus correction scale.   BPH Continue tamsulosin    Dyslipidemia Continue atorvastatin    PVD Continue ASA, Plavix    Essential hypertension Continue Coreg  Hold Entresto , torsemide  and Aldactone  in the setting of acute kidney injury Use hydralazine  IV as needed   Chronic pain Continue mirtazepine, Lyrica , Percocet  I have reviewed this patient in the [place] Controlled Substances Reporting System.  He is receiving medications from only one provider and appears to be taking them as prescribed. He is not at particularly high risk of opioid misuse, diversion, or overdose.        Consultants: Podiatry Vascular surgery Orthopedics ID PT OT TOC team   Procedures: R AKA 12/29   Antibiotics: Ceftriaxone  12/28-1/14 and then change to PO Vancomycin  12/28-1/14 and then change to PO  30 Day Unplanned Readmission Risk Score    Flowsheet Row ED to Hosp-Admission (Current) from 04/18/2023 in [facility] (1A)  30 Day  Unplanned Readmission Risk Score (%) 40.74 Filed at 04/28/2023 0401       This score is the patient's risk of an unplanned readmission within 30 days of being discharged (0 -100%). The score is based on dignosis, age, lab data, medications, orders, and  past utilization.   Low:  0-14.9   Medium: 15-21.9   High: 22-29.9   Extreme: 30 and above           Subjective: Reports that he died overnight and awoke as a vampire, considering whether to bite my neck.   Objective: Vitals:   04/28/23 0847 04/28/23 1606  BP: (!) 95/54 123/63  Pulse: 71 74  Resp: 17 17  Temp: 98.8 F (37.1 C) 98.6 F (37 C)  SpO2: 92% 94%    Intake/Output Summary (Last 24 hours) at 04/28/2023 1621 Last data filed at 04/28/2023 1345 Gross per 24 hour  Intake 240 ml  Output 950 ml  Net -710 ml   Filed Weights   04/18/23 1755  Weight: 83.9 kg    Exam:  General:  Withdrawn, sedate Eyes:  EOMI, normal lids, iris ENT:  grossly normal hearing, lips & tongue, mmm Neck:  no LAD, masses or thyromegaly Cardiovascular:  RRR, no m/r/g. Respiratory:   CTA bilaterally with no wheezes/rales/rhonchi.  Normal respiratory effort. Abdomen:  soft, NT, ND Skin:  no rash or induration seen on limited exam Musculoskeletal: s/p R BKA Psychiatric:  eccentric mood and affect, speech appropriate Neurologic:  CN 2-12 grossly intact, moves all extremities in coordinated fashion  Data Reviewed: I have reviewed the patient's lab results since admission.  Pertinent labs for today include:   Glucose 123 BUN 28 WBC 10.7 Hgb 7.5 - stable Platelets 453     Family Communication: None present  Disposition: Status is: Inpatient Remains inpatient appropriate because: needs placement     Time spent: 35 minutes  Unresulted Labs (From admission, onward)     Start     Ordered   04/29/23 0500  Vancomycin , trough  Once-Timed,   TIMED       Question:  Specimen collection method  Answer:  Lab=Lab collect   04/28/23 1239   04/29/23 0500  CBC with Differential/Platelet  Tomorrow morning,   R       Question:  Specimen collection method  Answer:  Lab=Lab collect   04/28/23 1621   04/29/23 0500  Basic metabolic panel  Tomorrow morning,   R       Question:  Specimen  collection method  Answer:  Lab=Lab collect   04/28/23 1621             Author: Delon Herald, MD 04/28/2023 4:21 PM  For on call review [email or web address].

## 2023-04-28 NOTE — Plan of Care (Signed)
   Problem: Education: Goal: Knowledge of General Education information will improve Description: Including pain rating scale, medication(s)/side effects and non-pharmacologic comfort measures Outcome: Progressing   Problem: Safety: Goal: Ability to remain free from injury will improve Outcome: Progressing

## 2023-04-28 NOTE — Progress Notes (Signed)
 PT Cancellation Note  Patient Details Name: Manuel [name] MRN: [id number] DOB: 02-Feb-1946   Cancelled Treatment:    Reason Eval/Treat Not Completed: Fatigue/lethargy limiting ability to participate Pt attempted in AM and PM.  Pt stated he had a rough night and is generally fatigued.  Wife and sitter in room.  Pt politely declined.  Will continue at a later time/date.   Lauraine Gills 04/28/2023, 2:26 PM

## 2023-04-28 NOTE — Progress Notes (Addendum)
 0133 - Pt says that he wants to die. RN asked the pt if he wants to kill himself, and pt says that pt wants to kill himself. RN notified NP. RN Physiological Scientist. Pt says that he currently does not have any pain and refused pain medications.  90 - NP said that he will put in 1-to-1 suicide watch for the patient. RN called for help to implement suicide protocol for the patient, while remaining with the patient in patient room.  96 - Charge Nurse came to the room. Pt told Charge Nurse that he wants to die. Pt says that he has a suicide note in mind that he intends to write and leave for his wife. Charge Nurse said that she will call [facility] and implement Suicide Precautions and find a heritage manager.   43 - Charge Nurse said that she will move the pt from double room  to single room for Suicide Watch.   9781 - Pt moved to single room. RN completed environmental and suicide check list.

## 2023-04-28 NOTE — BH Assessment (Signed)
 PT  PLACED  UNDER  IVC PAPERS  PER  J  LEE  NP  INFORMED  RN  Frann Rider  RN

## 2023-04-28 NOTE — Progress Notes (Signed)
 OT Cancellation Note  Patient Details Name: Manuel [name] MRN: [id number] DOB: Jul 18, 1945   Cancelled Treatment:    Reason Eval/Treat Not Completed: Patient declined, no reason specified.  Chart reviewed. On arrival pt reporting pain and not sleeping last night. Defers session this date. Will continue to follow POC.   Elston Slot, M.S. OTR/L  04/28/23, 2:18 PM  ascom [phone]

## 2023-04-28 NOTE — Progress Notes (Addendum)
 2210 - Patient is sleeping.  2218 - RN woke up pt to administer pt meds. Pt alert and oriented x4. Pt med administered. Suicide Sitter present in the room.  2235 - Pt BG 32. Orange Juice and Food given to the patient by Press Photographer. RN was at pharmacy getting controlled substance home med from pharmacy for another pt.  2253 - Repeat BG 33. Charge Nurse went to give Dextrose  50% solution.  2259 - BG 45.  2304 - Charge Nurse administered dextrose  50% solution.  2320 - BG 148  0127 - BG 139

## 2023-04-28 NOTE — TOC Progression Note (Addendum)
 Transition of Care [facility]) - Progression Note    Patient Details  Name: Manuel [name] MRN: [id number] Date of Birth: [birth year]  [facility] CM/SW Contact  Tomasa [name], RN Phone Number: 04/28/2023, 12:26 PM  Clinical Narrative:    TOC continuing to follow patient's progress throughout discharge planning.        Expected Discharge Plan and Services                                               Social Determinants of Health (SDOH) Interventions SDOH Screenings   Food Insecurity: No Food Insecurity (04/19/2023)  Housing: Low Risk  (04/19/2023)  Transportation Needs: No Transportation Needs (04/19/2023)  Utilities: Not At Risk (04/19/2023)  Financial Resource Strain: Patient Unable To Answer (02/20/2023)   Received from [facility]  Social Connections: Unknown (04/22/2023)  Tobacco Use: Low Risk  (04/21/2023)  Recent Concern: Tobacco Use - Medium Risk (01/28/2023)   Received from [facility]    Readmission Risk Interventions    02/18/2023   11:58 AM 02/15/2023    4:10 PM  Readmission Risk Prevention Plan  Transportation Screening  Complete  PCP or Specialist Appt within 3-5 Days Complete Complete  HRI or Home Care Consult Complete Complete  Social Work Consult for Recovery Care Planning/Counseling Complete Complete  Palliative Care Screening Not Applicable Not Applicable  Medication Review Oceanographer) Complete Complete

## 2023-04-29 DIAGNOSIS — I998 Other disorder of circulatory system: Secondary | ICD-10-CM

## 2023-04-29 DIAGNOSIS — F322 Major depressive disorder, single episode, severe without psychotic features: Secondary | ICD-10-CM | POA: Diagnosis not present

## 2023-04-29 LAB — CBC WITH DIFFERENTIAL/PLATELET
Abs Immature Granulocytes: 0.03 10*3/uL (ref 0.00–0.07)
Basophils Absolute: 0.1 10*3/uL (ref 0.0–0.1)
Basophils Relative: 1 %
Eosinophils Absolute: 0.1 10*3/uL (ref 0.0–0.5)
Eosinophils Relative: 2 %
HCT: 21.9 % — ABNORMAL LOW (ref 39.0–52.0)
Hemoglobin: 7.2 g/dL — ABNORMAL LOW (ref 13.0–17.0)
Immature Granulocytes: 0 %
Lymphocytes Relative: 23 %
Lymphs Abs: 1.8 10*3/uL (ref 0.7–4.0)
MCH: 28.2 pg (ref 26.0–34.0)
MCHC: 32.9 g/dL (ref 30.0–36.0)
MCV: 85.9 fL (ref 80.0–100.0)
Monocytes Absolute: 0.8 10*3/uL (ref 0.1–1.0)
Monocytes Relative: 10 %
Neutro Abs: 5 10*3/uL (ref 1.7–7.7)
Neutrophils Relative %: 64 %
Platelets: 456 10*3/uL — ABNORMAL HIGH (ref 150–400)
RBC: 2.55 MIL/uL — ABNORMAL LOW (ref 4.22–5.81)
RDW: 16 % — ABNORMAL HIGH (ref 11.5–15.5)
WBC: 7.9 10*3/uL (ref 4.0–10.5)
nRBC: 0 % (ref 0.0–0.2)

## 2023-04-29 LAB — BASIC METABOLIC PANEL
Anion gap: 10 (ref 5–15)
BUN: 28 mg/dL — ABNORMAL HIGH (ref 8–23)
CO2: 19 mmol/L — ABNORMAL LOW (ref 22–32)
Calcium: 8.4 mg/dL — ABNORMAL LOW (ref 8.9–10.3)
Chloride: 102 mmol/L (ref 98–111)
Creatinine, Ser: 0.81 mg/dL (ref 0.61–1.24)
GFR, Estimated: >60 mL/min (ref >60)
Glucose, Bld: 131 mg/dL — ABNORMAL HIGH (ref 70–99)
Potassium: 4.7 mmol/L (ref 3.5–5.1)
Sodium: 131 mmol/L — ABNORMAL LOW (ref 135–145)

## 2023-04-29 LAB — GLUCOSE, CAPILLARY
Glucose-Capillary: 139 mg/dL — ABNORMAL HIGH (ref 70–99)
Glucose-Capillary: 114 mg/dL — ABNORMAL HIGH (ref 70–99)
Glucose-Capillary: 194 mg/dL — ABNORMAL HIGH (ref 70–99)
Glucose-Capillary: 214 mg/dL — ABNORMAL HIGH (ref 70–99)
Glucose-Capillary: 246 mg/dL — ABNORMAL HIGH (ref 70–99)

## 2023-04-29 LAB — SURGICAL PATHOLOGY

## 2023-04-29 LAB — VANCOMYCIN, TROUGH: Vancomycin Tr: 13 ug/mL — ABNORMAL LOW (ref 15–20)

## 2023-04-29 MED ORDER — BISACODYL 5 MG PO TBEC
5.0000 mg | DELAYED_RELEASE_TABLET | Freq: Every day | ORAL | Status: DC | PRN
  Administered 2023-05-03 – 2023-05-07 (×3): 5 mg via ORAL
  Filled 2023-04-29 (×4): qty 1

## 2023-04-29 MED ORDER — SODIUM CHLORIDE 0.9% IV SOLUTION: Freq: Once | INTRAVENOUS | Status: DC

## 2023-04-29 MED ORDER — INSULIN GLARGINE-YFGN 100 UNIT/ML ~~LOC~~ SOLN
17.0000 [IU] | Freq: Every day | SUBCUTANEOUS | Status: DC
  Administered 2023-04-29 – 2023-05-08 (×10): 17 [IU] via SUBCUTANEOUS
  Filled 2023-04-29 (×10): qty 0.17

## 2023-04-29 MED ORDER — POLYETHYLENE GLYCOL 3350 17 G PO PACK
17.0000 g | PACK | Freq: Every day | ORAL | Status: DC
  Administered 2023-04-29 – 2023-05-08 (×6): 17 g via ORAL
  Filled 2023-04-29 (×8): qty 1

## 2023-04-29 NOTE — Progress Notes (Addendum)
 Physical Therapy Treatment Patient Details Name: Manuel [name] MRN: [id number] DOB: 06/19/1945 Today's Date: 04/29/2023   History of Present Illness Pt is a 78 y.o. male presenting to hospital 04/19/23 with c/o AMS, weakness, fall, and R leg swelling/pain.  Recently seen in ED for fall onto R knee causing effusion and pain; pt s/p R foot TMA 2 months prior.  Pt admitted with sepsis d/t cellulitis, AKI superimposed on CKD, hyponatremia, acute metabolic encephalopathy.  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  S/p R knee aspiration 04/20/23. S/p R knee joint I&D 04/21/23 d/t R septic knee.  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    PT Comments  Arrived in room for co-tx with OT.  Pt standing with sitter and wife transferring to [facility]  Assisted with bathing then transfer to recliner.  Pt did report some dizziness 86/55 in recliner with feet down  Feet elevated and BP increased to 122/59.  Pt stated he was feeling better and wanted to remain up. Communicated with staff and MD regarding response to session.    Addendum 1:20- noted Code Blue called.   Will check orders tomorrow and proceed as appropriate.   If plan is discharge home, recommend the following: Assistance with cooking/housework;Direct supervision/assist for medications management;Direct supervision/assist for financial management;Assist for transportation;Help with stairs or ramp for entrance;Supervision due to cognitive status;Two people to help with walking and/or transfers;Two people to help with bathing/dressing/bathroom   Can travel by private vehicle        Equipment Recommendations       Recommendations for Other Services       Precautions / Restrictions Precautions Precautions: Fall Restrictions Weight Bearing Restrictions Per Provider Order: Yes RLE Weight Bearing Per Provider Order: Non weight bearing Other Position/Activity  Restrictions: R BKA     Mobility  Bed Mobility               General bed mobility comments: pt transferring with sitter and RN upon arrival to [facility] Patient Response: Cooperative  Transfers Overall transfer level: Needs assistance Equipment used: Rolling walker (2 wheels) Transfers: Sit to/from Stand Sit to Stand: Min assist, +2 physical assistance                Ambulation/Gait               General Gait Details: deferred due to dizziness   Stairs             Wheelchair Mobility     Tilt Bed Tilt Bed Patient Response: Cooperative  Modified Rankin (Stroke Patients Only)       Balance Overall balance assessment: Needs assistance Sitting-balance support: No upper extremity supported, Feet supported Sitting balance-Leahy Scale: Good     Standing balance support: During functional activity, Reliant on assistive device for balance, Bilateral upper extremity supported Standing balance-Leahy Scale: Fair                              Cognition Arousal: Alert Behavior During Therapy: WFL for tasks assessed/performed Overall Cognitive Status: Within Functional Limits for tasks assessed                                          Exercises      General Comments General  comments (skin integrity, edema, etc.): seated BP 86/55, MAP 66 HR 57. reclined and improved BP122/59      Pertinent Vitals/Pain Pain Assessment Pain Assessment: No/denies pain    Home Living                          Prior Function            PT Goals (current goals can now be found in the care plan section) Progress towards PT goals: Progressing toward goals    Frequency    Min 1X/week      PT Plan      Co-evaluation PT/OT/SLP Co-Evaluation/Treatment: Yes Reason for Co-Treatment: For patient/therapist safety;To address functional/ADL transfers PT goals addressed during session: Mobility/safety with  mobility;Balance;Proper use of DME OT goals addressed during session: ADL's and self-care;Proper use of Adaptive equipment and DME      AM-PAC PT 6 Clicks Mobility   Outcome Measure  Help needed turning from your back to your side while in a flat bed without using bedrails?: A Little Help needed moving from lying on your back to sitting on the side of a flat bed without using bedrails?: A Little Help needed moving to and from a bed to a chair (including a wheelchair)?: A Lot Help needed standing up from a chair using your arms (e.g., wheelchair or bedside chair)?: A Lot Help needed to walk in hospital room?: Total Help needed climbing 3-5 steps with a railing? : Total 6 Click Score: 12    End of Session Equipment Utilized During Treatment: Gait belt Activity Tolerance: Treatment limited secondary to medical complications (Comment) Patient left: in chair;with call bell/phone within reach;with chair alarm set Nurse Communication: Mobility status;Precautions;Other (comment) PT Visit Diagnosis: Other abnormalities of gait and mobility (R26.89);Muscle weakness (generalized) (M62.81);History of falling (Z91.81);Pain Pain - Right/Left: Right Pain - part of body: Knee;Leg     Time: 1150-1205 PT Time Calculation (min) (ACUTE ONLY): 15 min  Charges:    $Therapeutic Activity: 8-22 mins PT General Charges $$ ACUTE PT VISIT: 1 Visit                   Lauraine Gills, PTA 04/29/23, 1:21 PM

## 2023-04-29 NOTE — Progress Notes (Signed)
 Occupational Therapy Treatment Patient Details Name: Manuel [name] MRN: [id number] DOB: 10-Feb-1946 Today's Date: 04/29/2023   History of present illness Pt is a 78 y.o. male presenting to hospital 04/19/23 with c/o AMS, weakness, fall, and R leg swelling/pain.  Recently seen in ED for fall onto R knee causing effusion and pain; pt s/p R foot TMA 2 months prior.  Pt admitted with sepsis d/t cellulitis, AKI superimposed on CKD, hyponatremia, acute metabolic encephalopathy.  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  S/p R knee aspiration 04/20/23. S/p R knee joint I&D 04/21/23 d/t R septic knee.  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   OT comments  Mr Cass was seen for OT/PT co-treatment on this date. Upon arrival to room pt standing with NT and wife assisting with BSC t/f, pt agreeable to treatment session. Pt requires MOD A for bathing sitting on BSC. Pt appears with delayed responses and states dizzyness/wooziness in sitting. MIN A x2 + RW for BSC>chair pivot t/f.  Seated BP 86/55, MAP 66, HR 57. MD/RN notified. Pt reclined and improved BP 122/59 with pt reporting resolved dizziness. Tolerated seated BUE therex as described below. Pt making progress toward goals, will continue to follow POC. Discharge recommendation remains appropriate.       If plan is discharge home, recommend the following:  A lot of help with walking and/or transfers;A lot of help with bathing/dressing/bathroom;Assistance with cooking/housework;Assist for transportation;Help with stairs or ramp for entrance;Direct supervision/assist for medications management;Direct supervision/assist for financial management;Supervision due to cognitive status   Equipment Recommendations  BSC/3in1    Recommendations for Other Services Rehab consult    Precautions / Restrictions Precautions Precautions: Fall Restrictions Weight Bearing  Restrictions Per Provider Order: No       Mobility Bed Mobility               General bed mobility comments: not tested    Transfers Overall transfer level: Needs assistance Equipment used: Rolling walker (2 wheels) Transfers: Sit to/from Stand, Bed to chair/wheelchair/BSC Sit to Stand: Min assist, +2 physical assistance Stand pivot transfers: Min assist, +2 physical assistance         General transfer comment: pt c/o dizzy/woozy feeling on BSC, +2 for safety     Balance Overall balance assessment: Needs assistance Sitting-balance support: No upper extremity supported, Feet supported Sitting balance-Leahy Scale: Good     Standing balance support: During functional activity, Reliant on assistive device for balance, Bilateral upper extremity supported Standing balance-Leahy Scale: Poor                             ADL either performed or assessed with clinical judgement   ADL Overall ADL's : Needs assistance/impaired                                       General ADL Comments: MOD A for bathing sitting on BSC.       Cognition Arousal: Alert Behavior During Therapy: WFL for tasks assessed/performed Overall Cognitive Status: Within Functional Limits for tasks assessed  Exercises Exercises: General Upper Extremity General Exercises - Upper Extremity Shoulder Flexion: AROM, Strengthening, Both, 20 reps, Seated, Bar weights/barbell Bar Weights/Barbell (Shoulder Flexion): 5 lbs Shoulder ABduction: AROM, Strengthening, Both, 20 reps, Seated, Bar weights/barbell Bar Weights/Barbell (Shoulder Abduction): 5 lbs Shoulder ADduction: AROM, Strengthening, Both, 20 reps, Seated, Bar weights/barbell Bar Weights/Barbell (Shoulder Adduction): 5 lbs Elbow Flexion: AROM, Strengthening, Both, 20 reps, Seated, Bar weights/barbell Bar Weights/Barbell (Elbow Flexion): 5 lbs Elbow Extension: AROM,  Strengthening, Both, 20 reps, Seated, Bar weights/barbell Bar Weights/Barbell (Elbow Extension): 5 lbs    Shoulder Instructions       General Comments seated BP 86/55, MAP 66 HR 57. reclined and improved BP122/59    Pertinent Vitals/ Pain       Pain Assessment Pain Assessment: No/denies pain   Frequency  Min 1X/week        Progress Toward Goals  OT Goals(current goals can now be found in the care plan section)  Progress towards OT goals: Progressing toward goals  Acute Rehab OT Goals OT Goal Formulation: With patient/family Time For Goal Achievement: 05/07/23 Potential to Achieve Goals: Good ADL Goals Pt Will Perform Lower Body Dressing: with modified independence;sitting/lateral leans Pt Will Transfer to Toilet: with supervision;bedside commode;ambulating Pt Will Perform Toileting - Clothing Manipulation and hygiene: sitting/lateral leans;with modified independence Additional ADL Goal #1: Pt will demonstrate independence with desensitization strategies to support residual limb recovery. Additional ADL Goal #2: Pt will complete all aspects of a seated bath with set up and supv for safety.  Plan      Co-evaluation    PT/OT/SLP Co-Evaluation/Treatment: Yes Reason for Co-Treatment: For patient/therapist safety;To address functional/ADL transfers PT goals addressed during session: Mobility/safety with mobility;Balance;Proper use of DME;Strengthening/ROM OT goals addressed during session: ADL's and self-care;Proper use of Adaptive equipment and DME      AM-PAC OT 6 Clicks Daily Activity     Outcome Measure   Help from another person eating meals?: None Help from another person taking care of personal grooming?: A Little Help from another person toileting, which includes using toliet, bedpan, or urinal?: A Lot Help from another person bathing (including washing, rinsing, drying)?: A Lot Help from another person to put on and taking off regular upper body clothing?:  A Little Help from another person to put on and taking off regular lower body clothing?: A Lot 6 Click Score: 16    End of Session Equipment Utilized During Treatment: Rolling walker (2 wheels)  OT Visit Diagnosis: Other abnormalities of gait and mobility (R26.89);Pain Pain - Right/Left: Right Pain - part of body: Leg;Ankle and joints of foot   Activity Tolerance Patient tolerated treatment well   Patient Left in chair;with call bell/phone within reach;with nursing/sitter in room;with family/visitor present   Nurse Communication Mobility status        Time: 8849-8784 OT Time Calculation (min): 25 min  Charges: OT General Charges $OT Visit: 1 Visit OT Treatments $Therapeutic Exercise: 8-22 mins  Elston Slot, M.S. OTR/L  04/29/23, 1:00 PM  ascom [phone]

## 2023-04-29 NOTE — Progress Notes (Addendum)
 0500 - RN covered the 30 minute break of 1-to-1 Suicide Sitter.  8413 - Phlebotomist came and drew pt's Vancomycin trough lab.  0530 - 1-to-1 Sitter came back from her break. Pt slept through the whole time that RN covered 1-to-1 Sitter's break.

## 2023-04-29 NOTE — Progress Notes (Signed)
 Entered room to find patient blue, agonal breathing, no pulse noted. Wife at bedside stating to save him save him. Code blue called and CPR started. See code documentation. ROSC achieved, Dr Barbarann at bedside and spoke with patient's wife. See MD notes/orders for further information.

## 2023-04-29 NOTE — Progress Notes (Signed)
 Progress Note   Patient: Manuel [name] FMW:[id number] DOB: 04/19/1946 DOA: 04/18/2023     10 DOS: the patient was seen and examined on 04/29/2023   Brief hospital course: 78yo with h/o CAD, HTN, DM, and chronic systolic CHF who presented on 80/72 with AMS and wound infection s/p recent I&D of R groin wound and prior R 4th/5th partial ray amputation (7/29).  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  He then underwent R knee I&D for septic/gouty arthritis on 12/30.  He will need SNF rehab.  Vascular surgery is recommending ASA 81 mg daily + Plavix  and Lipitor .  ID is consulting for antibiotic recommendations.  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/29 Also with septic knee with gouty arthritis and so underwent R knee I&D on 12/30; knee with MSSA so stopped colchicine  (resume if needed) Wound cultures with staph aureus, MSSA with resistance to Cipro , Clinda, Erythromycin, Tetracycline Started empirically on IV vancomycin  and Ceftriaxone ; will need IV abx through 1/14 and then PO due to septic arthritis, per ID He appears likely to need CIR and PT/OT/PM&R MD are in agreement; however, he developed severe depression with SI and now appears to need inpatient psychiatric support instead He no longer agrees to go to CIR but is willing to go to inpatient psych if that works out  Suicidal ideation This has been an issue in the past, as well Psychiatry consulted Currently on precautions He is recommended for geropsych inpatient Sitter present Need for IV antibiotics may limit his options for bed placement  Orthostasis with possible arrest Patient with precipitous BP drop with ambulation to the chair and then called arrest upon return to bed Suspect that this is hypovolemic, possibly related to  anemia Will transfuse 1 unit PRBC and continue to follow Of note, patient did receive about 5 minutes of CPR (nursing reports that his wife requested this despite DNR status) despite DNR status No further CPR is appropriate - discussed with patient and his wife  ABLA Hgb is slowly downtrending Currently 7.2, transfusion goal Hgb <7 However, given his orthostasis today and possible arrest, will go ahead and transfuse 1 unit PRBC  Recheck CBC in AM   Intermittent L side pain Wife reports that this is a chronic issue but seems to worsen after surgeries There is no obvious pattern to the pain Continue to monitor without intervention for now but consider imaging should pain worsen or become more consistent   Acute on chronic hyponatremia Appears to be a chronic issue, stable   Chronic systolic heart failure with a last EF of 30% in October 2025/ischemic cardiomyopathy Continue Coreg , Farxiga  Held Entresto , torsemide  and Aldactone  due to acute kidney injury Resume Entresto  and Aldactone  for now Consider restarting torsemide  soon    Acute metabolic encephalopathy Likely multifactorial in the setting of hypovolemia and infectious etiology Wife raises some concern for possible dementia Added delirium precautions   Uncontrolled type 2 diabetes mellitus with hyperglycemia, with long-term current use of insulin   A1c was 6.9 in 10/2022 Overnight hypoglycemia on 1/7 Change 70/30 to glargine DM coordinator is consulting   BPH Continue tamsulosin    Dyslipidemia Continue atorvastatin    PVD Continue ASA, Plavix    Essential hypertension Continue Coreg  Hold Entresto , torsemide  and Aldactone  in the setting of acute kidney injury  Use hydralazine  IV as needed   Chronic pain Continue mirtazepine, Lyrica , Percocet  I have reviewed this patient in the [place] Controlled Substances Reporting System.  He is receiving medications from only one provider and appears to be taking them as  prescribed. He is not at particularly high risk of opioid misuse, diversion, or overdose.   DNR Code status confirmed at various times during hospitalization, including post-CPR today Patient does not desire any additional CPR      Consultants: Podiatry Vascular surgery Orthopedics ID PT OT TOC team DM coordinator   Procedures: R AKA 12/29   Antibiotics: Ceftriaxone  12/28-1/14 and then change to PO Vancomycin  12/28-1/14 and then change to PO   30 Day Unplanned Readmission Risk Score    Flowsheet Row ED to Hosp-Admission (Current) from 04/18/2023 in [facility] (1A)  30 Day Unplanned Readmission Risk Score (%) 44.74 Filed at 04/29/2023 0802       This score is the patient's risk of an unplanned readmission within 30 days of being discharged (0 -100%). The score is based on dignosis, age, lab data, medications, orders, and past utilization.   Low:  0-14.9   Medium: 15-21.9   High: 22-29.9   Extreme: 30 and above           Subjective: Patient was giddy this AM.  He was long-winded and clearly feeling in better spirits.  He reports no desire to go to CIR but does think he needs psychiatric hospitalization.  Shortly after my evaluation, I received a message from PT that his BP dropped precipitously into the 60s/40s range while he was moving to a chair.  He was seated in the chair with his feet up with recovery to 120s/.  He subsequently was moved back to bed and arrested and CPR was initiated with code blue called.  I went to see the patient, realized that he is supposed to be DNR, and called off the code, at which time he had ROSC.  Suspect this was actually a vasovagal event.  Regardless, he is very upset about the code.   Objective: Vitals:   04/29/23 1412 04/29/23 1436  BP: 118/63 (!) 125/90  Pulse: 63 64  Resp: 18 20  Temp: 98.2 F (36.8 C) (!) 97.5 F (36.4 C)  SpO2: 99% 99%    Intake/Output Summary (Last 24 hours) at  04/29/2023 1522 Last data filed at 04/28/2023 2000 Gross per 24 hour  Intake 240 ml  Output 425 ml  Net -185 ml   Filed Weights   04/18/23 1755  Weight: 83.9 kg    Exam:  General:  Jubilant this AM; more reserved and pale post-CPR Eyes:  EOMI, normal lids, iris ENT:  grossly normal hearing, lips & tongue, mmm Neck:  no LAD, masses or thyromegaly Cardiovascular:  RRR. Respiratory:   CTA bilaterally with no wheezes/rales/rhonchi.  Normal respiratory effort. Abdomen:  soft, NT, ND Skin:  no rash or induration seen on limited exam Musculoskeletal: s/p R BKA Psychiatric:  eccentric mood and affect, speech appropriate Neurologic:  CN 2-12 grossly intact, moves all extremities in coordinated fashion  Data Reviewed: I have reviewed the patient's lab results since admission.  Pertinent labs for today include:   Na++ 131 Glucose 17-131 WBC 7.9 Hgb 7.2, 7.5 on 1/6 Platelets 456     Family Communication: Wife present throughout  Disposition: Status is: Inpatient Remains inpatient appropriate because: ongoing management  Total critical care time: 50 minutes Critical care time was exclusive of  separately billable procedures and treating other patients. Critical care was necessary to treat or prevent imminent or life-threatening deterioration. Critical care was time spent personally by me on the following activities: development of treatment plan with patient and/or surrogate as well as nursing, discussions with consultants, evaluation of patient's response to treatment, examination of patient, obtaining history from patient or surrogate, ordering and performing treatments and interventions, ordering and review of laboratory studies, ordering and review of radiographic studies, pulse oximetry and re-evaluation of patient's condition.   Unresulted Labs (From admission, onward)     Start     Ordered   04/30/23 0500  CBC with Differential/Platelet  Tomorrow morning,   R       Question:   Specimen collection method  Answer:  Lab=Lab collect   04/29/23 1522   04/30/23 0500  Basic metabolic panel  Tomorrow morning,   R       Question:  Specimen collection method  Answer:  Lab=Lab collect   04/29/23 1522             Author: Delon Herald, MD 04/29/2023 3:22 PM  For on call review [email or web address].

## 2023-04-29 NOTE — Progress Notes (Signed)
   04/29/23 1400  Spiritual Encounters  Type of Visit Initial  Care provided to: Family;Pt not available  Referral source Code page  Reason for visit Code  OnCall Visit Yes  Spiritual Framework  Patient Stress Factors Not reviewed  Family Stress Factors Major life changes  Interventions  Spiritual Care Interventions Made Established relationship of care and support;Compassionate presence  Intervention Outcomes  Outcomes Connection to spiritual care;Awareness of support  Spiritual Care Plan  Spiritual Care Issues Still Outstanding Referring to oncoming chaplain for further support   Responded to a Code blue page spoke with the wife. Just seat and waited with her until the code was all clear and she had the chance to revisit her husband. Will revisit later to see how patient is doing after he gets some rest.

## 2023-04-29 NOTE — Progress Notes (Signed)
 Subjective: 8 Days Post-Op Procedure(s) (LRB): IRRIGATION AND DEBRIDEMENT KNEE (Right) Patient s/p right BKA performed on 04/20/23 by vascular surgery. Patient is experiencing improved pain in the right knee this morning from yesterday. Planning for discharge to CIR at this time. Negative for chest pain and shortness of breath Fever:  No recent fevers. Gastrointestinal:Negative for nausea and vomiting Patient has had a BM.  Objective: Vital signs in last 24 hours: Temp:  [98.1 F (36.7 C)-99.1 F (37.3 C)] 99.1 F (37.3 C) (01/07 0818) Pulse Rate:  [72-74] 72 (01/07 0818) Resp:  [17-20] 18 (01/07 0818) BP: (99-126)/(56-76) 126/72 (01/07 0818) SpO2:  [94 %-100 %] 98 % (01/07 0818)  Intake/Output from previous day:  Intake/Output Summary (Last 24 hours) at 04/29/2023 0923 Last data filed at 04/28/2023 2000 Gross per 24 hour  Intake 240 ml  Output 775 ml  Net -535 ml    Intake/Output this shift: No intake/output data recorded.  Labs: Recent Labs    04/27/23 0511 04/28/23 0544 04/29/23 0521  HGB 7.5* 7.5* 7.2*   Recent Labs    04/28/23 0544 04/29/23 0521  WBC 10.7* 7.9  RBC 2.73* 2.55*  HCT 23.1* 21.9*  PLT 453* 456*   Recent Labs    04/28/23 0544 04/29/23 0521  NA 134* 131*  K 4.9 4.7  CL 105 102  CO2 21* 19*  BUN 28* 28*  CREATININE 0.90 0.81  GLUCOSE 123* 131*  CALCIUM  8.2* 8.4*   No results for input(s): LABPT, INR in the last 72 hours.  EXAM General - Patient is resting but does appear in more discomfort this AM. Extremity - ACE Wrap applied to the right lower extremity this morning. No drainage noted to the ACE wrap this AM.  Exposed knee incision without drainage or erythema. New dressing applied over incision with gauze and xeroform. Improved discomfort with palpation around the right knee this AM compared to yesterday. Abdomen soft to palpation.  Past Medical History:  Diagnosis Date   Acute ST elevation myocardial infarction (STEMI) of  inferior wall (HCC) 04/19/2010   a.) transfered from [facility] to [facility] --> LHC/PCI (very difficult procedure) --> 3.0 x 23 mm and 3.0 x 12 mm Xience stents to RCA   Allergies    Arthritis    Benign essential hypertension    Bilateral carotid artery disease (HCC) 05/08/2021   a.) carotid doppler 05/08/2021: 1-39% BICA   CAD (coronary artery disease) 04/19/2010   a.) inferior STEMI 04/19/2010 --> LHC/PCI: 50-70% pD1, 80% pRI, 90/90/90% RCA (overlapping 3.0 x 23 and 3.0 x 12 mm Xience DES); b.) MV 11/10/2018: fixed minimally reversible inferior perfusion defect   Cellulitis of foot    DDD (degenerative disc disease), cervical    Diabetes mellitus type 2, insulin  dependent (HCC)    Diverticulosis    Full dentures    Gout    Hard of hearing    History of bilateral cataract extraction 2022   History of ETOH abuse    Hyperlipidemia    Ischemic cardiomyopathy 04/19/2010   a.) TTE 04/19/2010: 40%; b.) TTE 04/20/2014: EF >55%, mild RVE, triv PR, mild MR/TR, G1DD; c.) TTE 11/10/2018: EF 45%, inf HK, mild RVE, triv TR/PR, mild MR, G1DD; d.) TTE 11/14/2022: EF 25-30%, basal anteroseptal, apical lateral, apical septal, and apex AK, mid anterolateral HK, mild-mod MR/TR   Long term current use of aspirin     Long term current use of clopidogrel     Lumbar degenerative disc disease    Lumbar radiculopathy  Lumbar vertebral fracture (chronic superior endplate of L1)    OSA (obstructive sleep apnea)    a.) unable to tolerate nocturnal PAP therapy   Peripheral artery disease (HCC)    a.) stenting 05/14/21: 12 mm x 12 cm LifeStent RIGHT dis SFA/prox pop; b.) s/p cath directed thrombolysis RIGHT SFA/pop 06/14/21; c.) s/p mech thrombectomy + stenting 06/15/21: 8 mm x 25cm & 8 mm x 7.5cm Viabahn; d.) s/p BILAT CFA, profunda femoris, SFA endarterectomies + fogarty embolectomy + stenting 11/15/22: 12mm x 58mm Lifestream BILAT CIAs, 14 mm x 6 cm Lifestream & 13 mm x 5 cm Viabahn LEFT EIA   Peripheral neuropathy     Umbilical hernia    Assessment/Plan: 8 Days Post-Op Procedure(s) (LRB): IRRIGATION AND DEBRIDEMENT KNEE (Right) Principal Problem:   Osteomyelitis (HCC) Active Problems:   Hypertension   Coronary artery disease with history of myocardial infarction without history of CABG   Diabetes mellitus type 2, insulin  dependent (HCC)   Mixed hyperlipidemia   Lower limb ischemia right leg with history of angioplasty and stent on 05/14/2021   Chronic combined systolic and diastolic CHF (congestive heart failure) (HCC)   Acute kidney injury superimposed on chronic kidney disease (HCC)   Hyponatremia   Uncontrolled type 2 diabetes mellitus with hyperglycemia, with long-term current use of insulin  (HCC)   Essential hypertension   Dyslipidemia   BPH (benign prostatic hyperplasia)   Acute metabolic encephalopathy   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   Weight as of this encounter: 83.9 kg. Up with therapy as tolerated.  No recent fevers. Hemoglobin at 7.2 this AM.  Continue to monitor. WBC at 7.9 this AM.  ESR at 94, CRP at 7.8. Uric acid WNL  No growth noted on gram stain and culture. Continue on Rocephin  and Vacomycin per infectious disease. WIll defer to vascular for wound changes to the right BKA. Per [facility], staples to be removed in 2-3 weeks.  DVT Prophylaxis - Lovenox  and Plavix  NWB to right lower extremity.  Fonda CHARLENA Koyanagi, PA-C [facility] 04/29/2023, 9:23 AM

## 2023-04-29 NOTE — Inpatient Diabetes Management (Signed)
 Inpatient Diabetes Program Recommendations  AACE/ADA: New Consensus Statement on Inpatient Glycemic Control   Target Ranges:  Prepandial:   less than 140 mg/dL      Peak postprandial:   less than 180 mg/dL (1-2 hours)      Critically ill patients:  140 - 180 mg/dL    Latest Reference Range & Units 04/28/23 16:09 04/28/23 22:32 04/28/23 22:35 04/28/23 22:53 04/28/23 22:59 04/28/23 23:20 04/29/23 01:27  Glucose-Capillary 70 - 99 mg/dL 873 (H)  Novolog  3 units   70/30 25 units 17 (LL) 32 (LL) 33 (LL) 45 (L) 148 (H) 139 (H)    Latest Reference Range & Units 04/28/23 08:32 04/28/23 11:40 04/28/23 13:18  Glucose-Capillary 70 - 99 mg/dL 875 (H)  Novolog  3 units  70/30 25 units 129 (H) 114 (H)    Review of Glycemic Control  Diabetes history: DM2 Outpatient Diabetes medications: 70/30 30 units BID, Farxiga  10 mg daily Current orders for Inpatient glycemic control: 70/30 25 units BID, Novolog  0-20 units TID with meals, Novolog  0-5 units at bedtime, Farxiga  10 mg daily  Inpatient Diabetes Program Recommendations:    Insulin : Please consider decreasing 70/30 to 20 units QAM with breakfast and 10 units QPM with supper.   Thanks, Earnie Gainer, RN, MSN, CDCES Diabetes Coordinator Inpatient Diabetes Program [phone] (Team Pager from 8am to 5pm)

## 2023-04-29 NOTE — Plan of Care (Signed)

## 2023-04-29 NOTE — Progress Notes (Signed)
 Spoke with patient and asked how he was feeling today. Pt stated slightly better. Asked patient if he still wanted to hurt himself or kill himself. Pt stated It's still an option. No one wants to live like this. 1:1 sitter at bedside during this time. Continued IVC at this time with 1:1 sitter.

## 2023-04-29 NOTE — Significant Event (Signed)
 Rapid Response Event Note   Reason for Call :  Code Blue  Initial Focused Assessment:   Patient was receiving chest compressions when I walked into room.  Bedside RN stated that patient had lost his pulse.  RN stated that he could feel a pulse - CPR was stopped and then someone stated that the patient was a DNR.  Dr. Barbarann at bedside. Patient vitals stable on room air and BP 140's systolic.  Patient alert and oriented- Talking to staff in his room.  Patient's wife at bedside.    Interventions:  Dr. Barbarann stated she would keep the patient in his current room- and would order a unit of PRBC-.   Plan of Care:     Event Summary:   MD Notified: Dr. Barbarann Call Time: Arrival Time: End Time:13:10  Allena JINNY Gunther, RN

## 2023-04-29 NOTE — Progress Notes (Signed)
 Pharmacy Antibiotic Note  Manuel [name] is a 78 y.o. male w/ PMH of OA, CAD, HTN, DDD, DM, gout, HLD, OSA, peripheral neuropathy admitted on 04/18/2023 with cellulitis.  Pharmacy has been consulted for vancomycin  dosing. 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  Can argue that the peak likely higher than measured.   Using data above (again, peak not drawn at appropriate time), AUC 431.5, Cmax 24.4 mcg/ml, Cmin 12.7 mcg/mL  Plan:  Based on 1/7 trough level (and fact peak not reliable as it was drawn early), continue Vancomycin  2000 mg IV every 24 hours.  AUC was acceptable but believe it is higher than calculated.  Pharmacy will continue to follow and will adjust abx dosing as warranted. F/u renal function ID following - planning two weeks of IV therapy to 1/14 then change to PO   Temp (24hrs), Avg:98.7 F (37.1 C), Min:98.1 F (36.7 C), Max:99.1 F (37.3 C)   Recent Labs  Lab 04/24/23 0417 04/25/23 0429 04/27/23 0511 04/28/23 0544 04/28/23 0833 04/29/23 0521  WBC 10.8* 11.9* 9.6 10.7*  --  7.9  CREATININE 0.85 0.83 0.75 0.90  --  0.81  VANCOTROUGH  --   --   --   --   --  13*  VANCOPEAK  --   --   --   --  20*  --     Estimated Creatinine Clearance: 78 mL/min (by C-G formula based on SCr of 0.81 mg/dL).    No Known Allergies  Antimicrobials this admission: 12/28 cefepime  >> x 1  dose 12/28 vancomycin  >>  12/28 ceftriaxone  >>   Microbiology results: 12/28 BCx: NGTD 12/28 WCx: moderate MSSA MRSA PCR negative  Thank you for involving pharmacy in this patient's care.   Marranda Arakelian, PharmD, BCPS, BCIDP Work Cell: [phone] 04/29/2023 8:56 AM

## 2023-04-29 NOTE — Progress Notes (Signed)
 Patient initially signed a blood consent. Pt then called this nurse back into the room and stated that he did not want the blood. Pt's daughter and wife at bedside. Notified Dr Ophelia Charter via epic chat.

## 2023-04-29 NOTE — Consult Note (Signed)
 [facility] Psychiatric Consult Follow-up  Patient Name: .Manuel [name]  MRN: [id number]  DOB: 1945/12/17  Consult Order details:  Orders (From admission, onward)     Start     Ordered   04/28/23 0203  IP CONSULT TO PSYCHIATRY       Ordering Provider: Jawo, Modou L, NP  Provider:  (Not yet assigned)  Question Answer Comment  Location [facility]   Reason for Consult? SI      04/28/23 0202            Mode of Visit: In person   Psychiatry Consult Evaluation  Service Date: April 29, 2023 LOS:  LOS: 10 days  Chief Complaint  my blood sugar was low last night  Primary Psychiatric Diagnoses  MDD  Assessment  Manuel [name] is a 78 y.o. male w/ history of osteoarthritis, coronary artery disease, essential hypertension, carotid artery disease, DDD, type 2 diabetes mellitus, gout, peripheral neuropathy who was medically admitted on 04/19/23 after presenting to the emergency department on 04/18/23 w/ AMS and wound infection. 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 Underwent R knee I&D for septic/gouty arthritis on 12/30. Psychiatry consulted for SI. Per available notes, pt reported to RN that he wants to die, wants to kill himself, has a suicide note in mind that he intends to write and leave for his wife. Pt is currently on a 1:1. Pt is under IVC and recommended for inpatient geripsychiatry.   On assessment today, pt endorses feeling tired, stating he was woken up due to low blood sugar. Endorses improvement in sleep and pain. Denies suicidal, homicidal ideations, auditory visual hallucinations or paranoia. Encouraged pt to engage with PT/OT. Continue duloxetine  20mg  oral 2 times daily and trazodone  100mg  oral daily at bedtime.  Diagnoses:  Active Hospital problems: Principal Problem:   Osteomyelitis (HCC) Active Problems:   Hypertension   Coronary artery disease with history of myocardial infarction without history of CABG   Diabetes  mellitus type 2, insulin  dependent (HCC)   Mixed hyperlipidemia   Lower limb ischemia right leg with history of angioplasty and stent on 05/14/2021   Chronic combined systolic and diastolic CHF (congestive heart failure) (HCC)   Acute kidney injury superimposed on chronic kidney disease (HCC)   Hyponatremia   Uncontrolled type 2 diabetes mellitus with hyperglycemia, with long-term current use of insulin  (HCC)   Essential hypertension   Dyslipidemia   BPH (benign prostatic hyperplasia)   Acute metabolic encephalopathy   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)   Diabetic infection of right foot (HCC)   Wound infection   Current severe episode of major depressive disorder without psychotic features (HCC)    Plan   ## Psychiatric Medication Recommendations:  -MDD and Insomnia: Continue trazodone  100mg  oral daily at bedtime -MDD and Neuropathy: Continue duloxetine  20mg  oral 2 times daily  ## Medical Decision Making Capacity: Not specifically addressed in this encounter  ## Further Work-up:  -- Defer to primary team  ## Disposition:-- We recommend inpatient psychiatric hospitalization after medical hospitalization. Patient has been involuntary committed on 04/28/23.   ## Behavioral / Environmental: -Utilize compassion and acknowledge the patient's experiences while setting clear and realistic expectations for care.   ## Safety and Observation Level:  - At this time, we recommend  1:1 Observation. This decision is based on my review of the chart including patient's history and current presentation, interview of the patient, mental status examination, and consideration of suicide  risk including evaluating suicidal ideation, plan, intent, suicidal or self-harm behaviors, risk factors, and protective factors. This judgment is based on our ability to directly address suicide risk, implement suicide prevention strategies, and develop a safety plan while the patient is in the clinical setting.  Please contact our team if there is a concern that risk level has changed.  CSSR Risk Category:C-SSRS RISK CATEGORY: High Risk  Suicide Risk Assessment: Patient has following non-modifiable or demographic risk factors for suicide: male gender Patient has the following protective factors against suicide: Supportive family, Supportive friends, Frustration tolerance, no history of suicide attempts, and no history of NSSIB  Thank you for this consult request. Recommendations have been communicated to the primary team.  We will continue to follow at this time.   [name], NP       History of Present Illness  Patient Report:  On assessment today, pt reports feeling tired. States he did get better sleep last night, although was woken up due to his blood sugar being low. He reports he feels a little better today compared to yesterday. Also reports improvement in pain today compared to yesterday. He denies suicidal, homicidal ideations. He denies auditory visual hallucinations or paranoia. Discussed with pt that per chart review, appears he did not engage with PT/OT yesterday. Encouraged pt to engage with PT/OT services today and he was agreeable.   Psych ROS:  Endorses tired mood Denies suicidal ideations Denies homicidal ideations Denies auditory visual hallucinations Endorses better sleep last night  Review of Systems  Constitutional:  Negative for chills and fever.  Respiratory:  Negative for shortness of breath.   Cardiovascular:  Negative for chest pain.  Musculoskeletal:        Lower extremity pain   Psychiatric and Social History  Psychiatric History:   Prev Dx/Sx: history of depression  Current Psych Provider: denies  Home Meds (current): denies current psychotropics although current orders include remeron  15mg  oral daily at bedtime and trazodone  25mg  oral prn daily at bedtime Previous Med Trials: previously took antidepressant for less than a week, he is not sure  of the name/dose of the antidepressant Therapy: not currently in counseling   Prior Psych Hospitalization: denies prior psych hospitalization  Prior Self Harm: denies history of suicide attempt or non suicidal self injurious behavior   Family Psych History: nephew depression Family Hx suicide: nephew died by suicide by hanging   Social History:  Educational Hx: 1 year of college 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 Legal Hx: No pending charges or court dates Living Situation: Living with wife, Evlyn, in [place]   Substance History Endorses use of alcohol  daily prior to admission, drinking 1/2 gallon of canadian whiskey/week. Denies history of Dts or withdrawal seizures. Denies experiencing withdrawal sx. Has been without alcohol  for 10 days now.    Endorses use of marijuana twice/week to once/month, several puffs/occasion.    Denies use of nicotine, crack/cocaine, methamphetamines, other substances.   Exam Findings   Vital Signs:  Temp:  [98.1 F (36.7 C)-99.1 F (37.3 C)] 99.1 F (37.3 C) (01/07 0818) Pulse Rate:  [72-74] 72 (01/07 0818) Resp:  [17-20] 18 (01/07 0818) BP: (99-126)/(56-76) 126/72 (01/07 0818) SpO2:  [94 %-100 %] 98 % (01/07 0818) Blood pressure 126/72, pulse 72, temperature 99.1 F (37.3 C), temperature source Oral, resp. rate 18, height 5' 8 (1.727 m), weight 83.9 kg, SpO2 98%. Body mass index is 28.12 kg/m.  Physical Exam Cardiovascular:     Rate  and Rhythm: Normal rate.  Pulmonary:     Effort: Pulmonary effort is normal. No respiratory distress.  Neurological:     Mental Status: He is alert and oriented to person, place, and time.  Psychiatric:        Attention and Perception: Attention and perception normal.        Mood and Affect: Affect is blunt.        Speech: Speech normal.        Behavior: Behavior normal. Behavior is cooperative.        Thought Content: Thought content normal.        Cognition and Memory: Cognition and  memory normal.    Mental Status Exam: General Appearance: Disheveled  Orientation:  Full (Time, Place, and Person)  Memory:  Immediate;   Fair  Concentration:  Concentration: Fair and Attention Span: Fair  Recall:  Fair  Attention  Fair  Eye Contact:  Fair  Speech:  Clear and Coherent  Language:  Fair  Volume:  Normal  Mood: tired  Affect:  Blunt  Thought Process:  Coherent, Goal Directed, and Linear  Thought Content:  Logical  Suicidal Thoughts:  No  Homicidal Thoughts:  No  Judgement:  Improving  Insight:   Improving  Psychomotor Activity:   lying down on assessment  Akathisia:  No  Fund of Knowledge:  Fair      Assets:  Manufacturing Systems Engineer Desire for Improvement Financial Resources/Insurance Housing Intimacy Leisure Time Resilience Social Support  Cognition:  WNL  ADL's:  Impaired  AIMS (if indicated):        Other History   These have been pulled in through the EMR, reviewed, and updated if appropriate.  Family History:  The patient's family history includes Heart disease in his father; Scoliosis in his mother.  Medical History: Past Medical History:  Diagnosis Date   Acute ST elevation myocardial infarction (STEMI) of inferior wall (HCC) 04/19/2010   a.) transfered from [facility] to [facility] --> LHC/PCI (very difficult procedure) --> 3.0 x 23 mm and 3.0 x 12 mm Xience stents to RCA   Allergies    Arthritis    Benign essential hypertension    Bilateral carotid artery disease (HCC) 05/08/2021   a.) carotid doppler 05/08/2021: 1-39% BICA   CAD (coronary artery disease) 04/19/2010   a.) inferior STEMI 04/19/2010 --> LHC/PCI: 50-70% pD1, 80% pRI, 90/90/90% RCA (overlapping 3.0 x 23 and 3.0 x 12 mm Xience DES); b.) MV 11/10/2018: fixed minimally reversible inferior perfusion defect   Cellulitis of foot    DDD (degenerative disc disease), cervical    Diabetes mellitus type 2, insulin  dependent (HCC)    Diverticulosis    Full dentures    Gout    Hard of hearing     History of bilateral cataract extraction 2022   History of ETOH abuse    Hyperlipidemia    Ischemic cardiomyopathy 04/19/2010   a.) TTE 04/19/2010: 40%; b.) TTE 04/20/2014: EF >55%, mild RVE, triv PR, mild MR/TR, G1DD; c.) TTE 11/10/2018: EF 45%, inf HK, mild RVE, triv TR/PR, mild MR, G1DD; d.) TTE 11/14/2022: EF 25-30%, basal anteroseptal, apical lateral, apical septal, and apex AK, mid anterolateral HK, mild-mod MR/TR   Long term current use of aspirin     Long term current use of clopidogrel     Lumbar degenerative disc disease    Lumbar radiculopathy    Lumbar vertebral fracture (chronic superior endplate of L1)    OSA (obstructive sleep apnea)    a.) unable  to tolerate nocturnal PAP therapy   Peripheral artery disease (HCC)    a.) stenting 05/14/21: 12 mm x 12 cm LifeStent RIGHT dis SFA/prox pop; b.) s/p cath directed thrombolysis RIGHT SFA/pop 06/14/21; c.) s/p mech thrombectomy + stenting 06/15/21: 8 mm x 25cm & 8 mm x 7.5cm Viabahn; d.) s/p BILAT CFA, profunda femoris, SFA endarterectomies + fogarty embolectomy + stenting 11/15/22: 12mm x 58mm Lifestream BILAT CIAs, 14 mm x 6 cm Lifestream & 13 mm x 5 cm Viabahn LEFT EIA   Peripheral neuropathy    Umbilical hernia     Surgical History: Past Surgical History:  Procedure Laterality Date   AMPUTATION Right 11/18/2022   Procedure: AMPUTATION 4TH AND 5TH RAY;  Surgeon: [name], [name];  Location: [facility];  Service: Orthopedics/Podiatry;  Laterality: Right;  4th and 5th toe   AMPUTATION Right 04/20/2023   Procedure: AMPUTATION BELOW KNEE;  Surgeon: [name], MD;  Location: [facility];  Service: Vascular;  Laterality: Right;  block then general   APPLICATION OF WOUND VAC Right 01/09/2023   Procedure: APPLICATION OF WOUND VAC;  Surgeon: [name], MD;  Location: [facility];  Service: Vascular;  Laterality: Right;   CATARACT EXTRACTION W/PHACO Right 03/14/2021   Procedure: CATARACT EXTRACTION PHACO AND INTRAOCULAR LENS PLACEMENT (IOC)  RIGHT DIABETIC;  Surgeon: [name], MD;  Location: [facility];  Service: Ophthalmology;  Laterality: Right;  Diabetic 16.78 01:39.9   CATARACT EXTRACTION W/PHACO Left 03/28/2021   Procedure: CATARACT EXTRACTION PHACO AND INTRAOCULAR LENS PLACEMENT (IOC) LEFT DIABETIC 6.93 01:22.0;  Surgeon: [name], MD;  Location: [facility];  Service: Ophthalmology;  Laterality: Left;  Diabetic   COLONOSCOPY     CORONARY ANGIOPLASTY WITH STENT PLACEMENT  03/2010   Procedure: CORONARY ANGIOPLASTY WITH STENT PLACEMENT; Location: [name]   ENDARTERECTOMY FEMORAL Bilateral 11/15/2022   Procedure: BILATERAL COMMON FEMORAL PROFUNDA FEMORIS AND SUPERFICIAL FEMORAL ARTERY ENDARTECTOMIES, RIGHT FOGARTY EMBOLECTOMY OF THE RIGHT SFA  AND  POPLITEAL ARTERIES. AORTAGRAM AND RIGHT LOWER EXTREMITY ANGIOGRAM.;  Surgeon: [name], MD;  Location: [facility];  Service: Vascular;  Laterality: Bilateral;   INSERTION OF ILIAC STENT Bilateral 11/15/2022   Procedure: BILATERAL STENT INSERTION IN BILATERAL  COMMON ILIAC ARTERY, STENT INSERTION OF LEFT EXTERNAL ILIAC ARTERY. ANGIOPLASTY RIGHT TIBIAL  AND POPLITEAL ARTERY.;  Surgeon: [name], MD;  Location: [facility];  Service: Vascular;  Laterality: Bilateral;   IRRIGATION AND DEBRIDEMENT KNEE Right 04/21/2023   Procedure: IRRIGATION AND DEBRIDEMENT KNEE;  Surgeon: Zafonte, Brian Cassidy, MD;  Location: [facility];  Service: Orthopedics;  Laterality: Right;   LOWER EXTREMITY ANGIOGRAPHY Right 05/14/2021   Procedure: LOWER EXTREMITY ANGIOGRAPHY;  Surgeon: [name], MD;  Location: [facility];  Service: Cardiovascular;  Laterality: Right;   LOWER EXTREMITY ANGIOGRAPHY Right 06/14/2021   Procedure: Lower Extremity Angiography;  Surgeon: [name], MD;  Location: [facility];  Service: Cardiovascular;  Laterality: Right;   LOWER EXTREMITY ANGIOGRAPHY Right 11/11/2022   Procedure: Lower Extremity Angiography;  Surgeon: [name], MD;  Location: [facility];  Service: Cardiovascular;  Laterality: Right;   LOWER EXTREMITY INTERVENTION Right 06/15/2021   Procedure: LOWER EXTREMITY INTERVENTION;  Surgeon: [name], MD;  Location: [facility];  Service: Cardiovascular;  Laterality: Right;   TONSILLECTOMY     TRANSMETATARSAL AMPUTATION Right 02/17/2023   Procedure: TRANSMETATARSAL AMPUTATION;  Surgeon: Lennie Barter, [name];  Location: [facility];  Service: Orthopedics/Podiatry;  Laterality: Right;  WOUND DEBRIDEMENT Right 01/09/2023   Procedure: DEBRIDEMENT WOUND;  Surgeon: [name], MD;  Location: [facility];  Service: Vascular;  Laterality: Right;     Medications:   Current Facility-Administered Medications:    acetaminophen  (TYLENOL ) tablet 650 mg, 650 mg, Oral, Q6H PRN, 650 mg at 04/24/23 1145 **OR** acetaminophen  (TYLENOL ) suppository 650 mg, 650 mg, Rectal, Q6H PRN, Zafonte, Redell Ned, MD   aspirin  EC tablet 81 mg, 81 mg, Oral, Daily, Zafonte, Redell Ned, MD, 81 mg at 04/28/23 0913   atorvastatin  (LIPITOR ) tablet 80 mg, 80 mg, Oral, QHS, Zafonte, Redell Ned, MD, 80 mg at 04/28/23 2218   carvedilol  (COREG ) tablet 25 mg, 25 mg, Oral, BID WC, Zafonte, Redell Ned, MD, 25 mg at 04/28/23 1734   cefTRIAXone  (ROCEPHIN ) 2 g in sodium chloride  0.9 % 100 mL IVPB, 2 g, Intravenous, Q24H, Ravishankar, Jayashree, MD, Last Rate: 200 mL/hr at 04/28/23 1508, 2 g at 04/28/23 1508   clopidogrel  (PLAVIX ) tablet 75 mg, 75 mg, Oral, Daily, Zafonte, Redell Ned, MD, 75 mg at 04/28/23 9085   dapagliflozin  propanediol (FARXIGA ) tablet 10 mg, 10 mg, Oral, Daily, Zafonte, Redell Ned, MD, 10 mg at 04/28/23 0914   DULoxetine  (CYMBALTA ) DR capsule 20 mg, 20 mg, Oral, BID, Zander Ingham Eun, NP, 20 mg at 04/28/23 2219   enoxaparin  (LOVENOX ) injection 40 mg, 40 mg, Subcutaneous, Q24H, Zafonte, Redell Ned, MD, 40 mg at 04/28/23 0913   folic acid  (FOLVITE ) tablet 1 mg, 1 mg, Oral, Daily, Zafonte, Redell Ned, MD, 1 mg at 04/28/23 9086   guaiFENesin  (MUCINEX ) 12 hr tablet 600 mg, 600 mg, Oral, BID PRN, Barbarann Nest, MD, 600 mg at 04/24/23 1620   hydrALAZINE  (APRESOLINE ) tablet 25 mg, 25 mg, Oral, Q6H PRN, Zafonte, Redell Ned, MD   insulin  aspart (novoLOG ) injection 0-20 Units, 0-20 Units, Subcutaneous, TID WC, Zafonte, Redell Ned, MD, 3 Units at 04/28/23 1735   insulin  aspart (novoLOG ) injection 0-5 Units, 0-5 Units, Subcutaneous, QHS, Zafonte, Redell Ned, MD, 2 Units at 04/20/23 2215   insulin  glargine-yfgn (SEMGLEE ) injection 17 Units, 17 Units, Subcutaneous, Daily, Barbarann Nest, MD   iron  polysaccharides (NIFEREX) capsule 150 mg, 150 mg, Oral, Daily, Zafonte, Redell Ned, MD, 150 mg at 04/28/23 9085   menthol -cetylpyridinium (CEPACOL) lozenge 3 mg, 1 lozenge, Oral, PRN, Barbarann Nest, MD   morphine  (PF) 2 MG/ML injection 2 mg, 2 mg, Intravenous, Q2H PRN, Barbarann Nest, MD, 2 mg at 04/28/23 1857   multivitamin with minerals tablet 1 tablet, 1 tablet, Oral, Daily, Zafonte, Redell Ned, MD, 1 tablet at 04/28/23 0913   nystatin  (MYCOSTATIN ) 100000 UNIT/ML suspension 500,000 Units, 5 mL, Oral, QID, Barbarann Nest, MD, 500,000 Units at 04/28/23 2218   ondansetron  (ZOFRAN ) tablet 4 mg, 4 mg, Oral, Q6H PRN **OR** ondansetron  (ZOFRAN ) injection 4 mg, 4 mg, Intravenous, Q6H PRN, Zafonte, Redell Ned, MD   Oral care mouth rinse, 15 mL, Mouth Rinse, PRN, Barbarann Nest, MD   oxyCODONE -acetaminophen  (PERCOCET/ROXICET) 5-325 MG per tablet 1 tablet, 1 tablet, Oral, Q6H PRN, Zafonte, Redell Ned, MD, 1 tablet at 04/28/23 1746   pregabalin  (LYRICA ) capsule 75 mg, 75 mg, Oral, BID, Zafonte, Redell Ned, MD, 75 mg at 04/28/23 2218   protein supplement (ENSURE MAX) liquid, 11 oz, Oral, Daily, Zafonte, Redell Ned, MD, 11 oz at 04/28/23 1320   sacubitril -valsartan  (ENTRESTO ) 24-26 mg per tablet, 1 tablet, Oral, Q12H, Barbarann Nest, MD, 1 tablet at 04/28/23 0914   senna-docusate (Senokot-S)  tablet 1 tablet, 1 tablet, Oral, QHS PRN, Zafonte,  Redell Ned, MD, 1 tablet at 04/22/23 1654   simethicone  (MYLICON) chewable tablet 80 mg, 80 mg, Oral, QID PRN, Barbarann Nest, MD, 80 mg at 04/24/23 1145   spironolactone  (ALDACTONE ) tablet 50 mg, 50 mg, Oral, Daily, Barbarann Nest, MD, 50 mg at 04/28/23 9086   tamsulosin  (FLOMAX ) capsule 0.4 mg, 0.4 mg, Oral, QPC supper, Zafonte, Redell Ned, MD, 0.4 mg at 04/28/23 1733   thiamine  (VITAMIN B1) tablet 100 mg, 100 mg, Oral, Daily, Zafonte, Redell Ned, MD, 100 mg at 04/28/23 9086   traZODone  (DESYREL ) tablet 100 mg, 100 mg, Oral, QHS, Tyra Gural Eun, NP   vancomycin  (VANCOREADY) IVPB 2000 mg/400 mL, 2,000 mg, Intravenous, Q24H, Ravishankar, Donald, MD, Last Rate: 200 mL/hr at 04/29/23 0537, 2,000 mg at 04/29/23 0537  Allergies: No Known Allergies  [name], NP

## 2023-04-30 DIAGNOSIS — M8600 Acute hematogenous osteomyelitis, unspecified site: Secondary | ICD-10-CM | POA: Diagnosis not present

## 2023-04-30 DIAGNOSIS — Z515 Encounter for palliative care: Secondary | ICD-10-CM

## 2023-04-30 DIAGNOSIS — T148XXA Other injury of unspecified body region, initial encounter: Secondary | ICD-10-CM | POA: Diagnosis not present

## 2023-04-30 DIAGNOSIS — B9561 Methicillin susceptible Staphylococcus aureus infection as the cause of diseases classified elsewhere: Secondary | ICD-10-CM | POA: Diagnosis not present

## 2023-04-30 DIAGNOSIS — I5042 Chronic combined systolic (congestive) and diastolic (congestive) heart failure: Secondary | ICD-10-CM

## 2023-04-30 DIAGNOSIS — M00061 Staphylococcal arthritis, right knee: Secondary | ICD-10-CM | POA: Diagnosis not present

## 2023-04-30 DIAGNOSIS — N179 Acute kidney failure, unspecified: Secondary | ICD-10-CM | POA: Diagnosis not present

## 2023-04-30 DIAGNOSIS — T8743 Infection of amputation stump, right lower extremity: Secondary | ICD-10-CM | POA: Diagnosis not present

## 2023-04-30 LAB — CBC WITH DIFFERENTIAL/PLATELET
Abs Immature Granulocytes: 0.04 K/uL (ref 0.00–0.07)
Basophils Absolute: 0 K/uL (ref 0.0–0.1)
Basophils Relative: 1 %
Eosinophils Absolute: 0.1 K/uL (ref 0.0–0.5)
Eosinophils Relative: 2 %
HCT: 22.8 % — ABNORMAL LOW (ref 39.0–52.0)
Hemoglobin: 7.2 g/dL — ABNORMAL LOW (ref 13.0–17.0)
Immature Granulocytes: 1 %
Lymphocytes Relative: 19 %
Lymphs Abs: 1.5 K/uL (ref 0.7–4.0)
MCH: 27.8 pg (ref 26.0–34.0)
MCHC: 31.6 g/dL (ref 30.0–36.0)
MCV: 88 fL (ref 80.0–100.0)
Monocytes Absolute: 0.8 K/uL (ref 0.1–1.0)
Monocytes Relative: 10 %
Neutro Abs: 5.3 K/uL (ref 1.7–7.7)
Neutrophils Relative %: 67 %
Platelets: 486 K/uL — ABNORMAL HIGH (ref 150–400)
RBC: 2.59 MIL/uL — ABNORMAL LOW (ref 4.22–5.81)
RDW: 16.2 % — ABNORMAL HIGH (ref 11.5–15.5)
WBC: 7.7 K/uL (ref 4.0–10.5)
nRBC: 0 % (ref 0.0–0.2)

## 2023-04-30 LAB — GLUCOSE, CAPILLARY
Glucose-Capillary: 138 mg/dL — ABNORMAL HIGH (ref 70–99)
Glucose-Capillary: 215 mg/dL — ABNORMAL HIGH (ref 70–99)
Glucose-Capillary: 161 mg/dL — ABNORMAL HIGH (ref 70–99)
Glucose-Capillary: 194 mg/dL — ABNORMAL HIGH (ref 70–99)

## 2023-04-30 LAB — HEPATIC FUNCTION PANEL
ALT: 54 U/L — ABNORMAL HIGH (ref 0–44)
AST: 32 U/L (ref 15–41)
Albumin: 2.2 g/dL — ABNORMAL LOW (ref 3.5–5.0)
Alkaline Phosphatase: 93 U/L (ref 38–126)
Bilirubin, Direct: <0.1 mg/dL (ref 0.0–0.2)
Total Bilirubin: 0.3 mg/dL (ref 0.0–1.2)
Total Protein: 6.3 g/dL — ABNORMAL LOW (ref 6.5–8.1)

## 2023-04-30 LAB — BASIC METABOLIC PANEL
Anion gap: 9 (ref 5–15)
BUN: 25 mg/dL — ABNORMAL HIGH (ref 8–23)
CO2: 22 mmol/L (ref 22–32)
Calcium: 8.6 mg/dL — ABNORMAL LOW (ref 8.9–10.3)
Chloride: 103 mmol/L (ref 98–111)
Creatinine, Ser: 0.82 mg/dL (ref 0.61–1.24)
GFR, Estimated: >60 mL/min (ref >60)
Glucose, Bld: 181 mg/dL — ABNORMAL HIGH (ref 70–99)
Potassium: 5 mmol/L (ref 3.5–5.1)
Sodium: 134 mmol/L — ABNORMAL LOW (ref 135–145)

## 2023-04-30 LAB — AEROBIC/ANAEROBIC CULTURE W GRAM STAIN (SURGICAL/DEEP WOUND): Culture: NO GROWTH

## 2023-04-30 NOTE — Plan of Care (Signed)
  Problem: Respiratory: Goal: Ability to maintain adequate ventilation will improve Outcome: Progressing   Problem: Education: Goal: Knowledge of General Education information will improve Description: Including pain rating scale, medication(s)/side effects and non-pharmacologic comfort measures Outcome: Progressing   Problem: Clinical Measurements: Goal: Respiratory complications will improve Outcome: Progressing   Problem: Clinical Measurements: Goal: Diagnostic test results will improve Outcome: Not Progressing Goal: Signs and symptoms of infection will decrease Outcome: Not Progressing   Problem: Skin Integrity: Goal: Risk for impaired skin integrity will decrease Outcome: Not Progressing   Problem: Clinical Measurements: Goal: Will remain free from infection Outcome: Not Progressing

## 2023-04-30 NOTE — Progress Notes (Signed)
 Inpatient Rehab Admissions Coordinator:    Insurance denied case for CIR. He is currently IVC'd and refusing therapies. I will not plan to appeal. Pt. Will likely need SNF.   Leita Kleine, MS, CCC-SLP Rehab Admissions Coordinator  [phone] (celll) [phone] (office)

## 2023-04-30 NOTE — Progress Notes (Signed)
 PROGRESS NOTE    Manuel [name]  FMW:[id number] DOB: 1946/04/09 DOA: 04/18/2023 PCP: Rudolpho Norleen BIRCH, MD    Assessment & Plan:   Principal Problem:   Osteomyelitis [facility]) Active Problems:   Acute kidney injury superimposed on chronic kidney disease (HCC)   Hyponatremia   Hypertension   Acute metabolic encephalopathy   Diabetes mellitus type 2, insulin  dependent (HCC)   Uncontrolled type 2 diabetes mellitus with hyperglycemia, with long-term current use of insulin  (HCC)   Mixed hyperlipidemia   Chronic combined systolic and diastolic CHF (congestive heart failure) (HCC)   Coronary artery disease with history of myocardial infarction without history of CABG   Lower limb ischemia right leg with history of angioplasty and stent on 05/14/2021   Essential hypertension   Dyslipidemia   BPH (benign prostatic hyperplasia)   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 Continue on IV rocephin  until 05/06/23 then cefudroxil & bactrim  due to septic arthritis as per ID.   Right septic knee: with gouty arthritis. S/p R knee I&D on 12/30. Cx  growing MSSA. Holding colchicine . Continue on IV rocephin  as per ID    Suicidal ideation: continue w/ sitter. Needs inpatient psych as per psych. Hx of suicidal ideation in the past    Orthostasis with possible arrest: with precipitous BP drop with ambulation to the chair and then called arrest upon return to bed. S/p did receive about 5 minutes of CPR (nursing reports that his wife requested this despite DNR status) despite DNR status. No further CPR is appropriate - discussed with patient and his wife   Acute blood loss anemia: will transfuse if Hb < 7.0. Will continue to monitor    Intermittent L  side pain: chronic. Etiology unclear. Will continue to monitor. Wife reports that this is a chronic issue but seems to worsen after surgeries  Hyponatremia: chronic. Will continue to monitor    Chronic systolic heart failure with a last EF of 30% in October 202. Hx of ischemic cardiomyopathy Continue on coreg , farxiga , entresto ,aldactone . Holding torsemide .    Acute metabolic encephalopathy: likely multifactorial in the setting of hypovolemia and infectious etiology. Re-orient prn    DM2: poorly controlled. Continue on glargine, SSI w/ accuchecks     BPH: continue on tamsulosin     HLD: continue on statin    PVD: continue on aspirin , plavix    HTN: continue on coreg , entresto , aldactone . Holding torsemide    Chronic pain: continue on percocet      DVT prophylaxis: lovenox   Code Status: full  Family Communication:  Disposition Plan: unclear, pt is considering comfort care   Level of care: Med-Surg  Status is: Inpatient Remains inpatient appropriate because: severity of illness    Consultants:  Palliative care  ID Psych  Ortho surg   Procedures:   Antimicrobials: rocephin    Subjective: Pt c/o malaise   Objective: Vitals:   04/29/23 1436 04/29/23 1538 04/29/23 2215 04/30/23 0827  BP: (!) 125/90 139/74 124/64 (!) 96/41  Pulse: 64 70 65 65  Resp: 20 18 19 18   Temp: (!) 97.5 F (36.4 C) 98.4 F (36.9 C) 97.6 F (36.4 C) 98.4 F (36.9 C)  TempSrc: Oral     SpO2: 99% 98% 97% 96%  Weight:  Height:        Intake/Output Summary (Last 24 hours) at 04/30/2023 0837 Last data filed at 04/30/2023 0700 Gross per 24 hour  Intake 1500 ml  Output 750 ml  Net 750 ml   Filed Weights   04/18/23 1755  Weight: 83.9 kg    Examination:  General exam: Appears uncomfortable  Respiratory system: decreased breath sounds b/l  Cardiovascular system: S1 & S2+. No rubs, gallops or clicks. Gastrointestinal system: Abdomen is nondistended, soft and nontender. Normal bowel  sounds heard. Central nervous system: Alert and oriented. Moves all extremities  Psychiatry: Judgement and insight appear normal. Flat mood and affect    Data Reviewed: I have personally reviewed following labs and imaging studies  CBC: Recent Labs  Lab 04/24/23 0417 04/25/23 0429 04/27/23 0511 04/28/23 0544 04/29/23 0521 04/30/23 0531  WBC 10.8* 11.9* 9.6 10.7* 7.9 7.7  NEUTROABS 6.7 8.5*  --   --  5.0 5.3  HGB 8.3* 8.3* 7.5* 7.5* 7.2* 7.2*  HCT 25.7* 25.6* 22.4* 23.1* 21.9* 22.8*  MCV 85.7 85.9 86.2 84.6 85.9 88.0  PLT 440* 468* 454* 453* 456* 486*   Basic Metabolic Panel: Recent Labs  Lab 04/25/23 0429 04/27/23 0511 04/28/23 0544 04/29/23 0521 04/30/23 0531  NA 134* 135 134* 131* 134*  K 4.1 4.2 4.9 4.7 5.0  CL 105 109 105 102 103  CO2 18* 22 21* 19* 22  GLUCOSE 106* 57* 123* 131* 181*  BUN 26* 23 28* 28* 25*  CREATININE 0.83 0.75 0.90 0.81 0.82  CALCIUM  8.2* 8.1* 8.2* 8.4* 8.6*   GFR: Estimated Creatinine Clearance: 77.1 mL/min (by C-G formula based on SCr of 0.82 mg/dL). Liver Function Tests: Recent Labs  Lab 04/24/23 0417  AST 46*  ALT 68*  ALKPHOS 103  BILITOT 0.8  PROT 6.0*  ALBUMIN 2.1*   No results for input(s): LIPASE, AMYLASE in the last 168 hours. No results for input(s): AMMONIA in the last 168 hours. Coagulation Profile: No results for input(s): INR, PROTIME in the last 168 hours. Cardiac Enzymes: No results for input(s): CKTOTAL, CKMB, CKMBINDEX, TROPONINI in the last 168 hours. BNP (last 3 results) No results for input(s): PROBNP in the last 8760 hours. HbA1C: No results for input(s): HGBA1C in the last 72 hours. CBG: Recent Labs  Lab 04/29/23 0710 04/29/23 1107 04/29/23 1314 04/29/23 2124 04/30/23 0824  GLUCAP 114* 194* 214* 246* 138*   Lipid Profile: No results for input(s): CHOL, HDL, LDLCALC, TRIG, CHOLHDL, LDLDIRECT in the last 72 hours. Thyroid Function Tests: No results for input(s):  TSH, T4TOTAL, FREET4, T3FREE, THYROIDAB in the last 72 hours. Anemia Panel: No results for input(s): VITAMINB12, FOLATE, FERRITIN, TIBC, IRON , RETICCTPCT in the last 72 hours. Sepsis Labs: No results for input(s): PROCALCITON, LATICACIDVEN in the last 168 hours.  Recent Results (from the past 240 hours)  Body fluid culture w Gram Stain     Status: None   Collection Time: 04/20/23  3:00 PM   Specimen: Synovium; Body Fluid  Result Value Ref Range Status   Specimen Description   Final    SYNOVIAL Performed at [facility], [address] [place], [place] [address]    Special Requests   Final    RIGHT KNEE Performed at [facility], [address] [place], [place] [address]    Gram Stain   Final    CORRECTED RESULTS NO ORGANISMS SEEN PREVIOUSLY REPORTED AS: MODERATE GRAM POSITIVE COCCI CORRECTED RESULTS CALLED TO: DR [name] [phone] FCP  ABUNDANT WBC PRESENT, PREDOMINANTLY PMN    Culture   Final    NO GROWTH 3 DAYS Performed at [facility], [address], [place], [place] [address]    Report Status 04/24/2023 FINAL  Final  Aerobic/Anaerobic Culture w Gram Stain (surgical/deep wound)     Status: None (Preliminary result)   Collection Time: 04/21/23  9:49 AM   Specimen: Path fluid; Body Fluid  Result Value Ref Range Status   Specimen Description   Final    FLUID Performed at [facility], [address] [place], [place] [address]    Special Requests SWAB OF RIGHT KNEE SYNOVIAL FLUID  Final   Gram Stain   Final    FEW WBC PRESENT, PREDOMINANTLY PMN NO ORGANISMS SEEN    Culture   Final    NO GROWTH 2 DAYS NO ANAEROBES ISOLATED; CULTURE IN PROGRESS FOR 5 DAYS Performed at [facility], [address] [place], [place] [address]    Report Status PENDING  Incomplete         Radiology Studies: No results found.      Scheduled Meds:  sodium chloride    Intravenous Once   aspirin  EC  81 mg Oral  Daily   atorvastatin   80 mg Oral QHS   carvedilol   25 mg Oral BID WC   clopidogrel   75 mg Oral Daily   dapagliflozin  propanediol  10 mg Oral Daily   DULoxetine   20 mg Oral BID   enoxaparin  (LOVENOX ) injection  40 mg Subcutaneous Q24H   folic acid   1 mg Oral Daily   insulin  aspart  0-20 Units Subcutaneous TID WC   insulin  aspart  0-5 Units Subcutaneous QHS   insulin  glargine-yfgn  17 Units Subcutaneous Daily   iron  polysaccharides  150 mg Oral Daily   multivitamin with minerals  1 tablet Oral Daily   nystatin   5 mL Oral QID   polyethylene glycol  17 g Oral Daily   pregabalin   75 mg Oral BID   Ensure Max Protein  11 oz Oral Daily   sacubitril -valsartan   1 tablet Oral Q12H   spironolactone   50 mg Oral Daily   tamsulosin   0.4 mg Oral QPC supper   thiamine   100 mg Oral Daily   traZODone   100 mg Oral QHS   Continuous Infusions:  cefTRIAXone  (ROCEPHIN )  IV 2 g (04/29/23 1602)   vancomycin  2,000 mg (04/30/23 0530)     LOS: 11 days      Anthony CHRISTELLA Pouch, MD [facility] Pager [phone]  If 7PM-7AM, please contact night-coverage [email or web address] 04/30/2023, 8:37 AM

## 2023-04-30 NOTE — Consult Note (Signed)
 Consultation Note Date: 04/30/2023 at 1100  Patient Name: Manuel [name]  DOB: 21-Jun-1945  MRN: [id number]  Age / Sex: 78 y.o., male  PCP: Rudolpho Norleen BIRCH, MD Referring Physician: Trudy Anthony HERO, MD  HPI/Patient Profile: 78 y.o. 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  He then underwent right knee I&D for septic/gouty arthritis on 12/30.  Vascular surgery is recommending aspirin  daily with Plavix  and Lipitor .  ID is consulted for antibiotic recommendations.  PMT was consulted to discuss boundaries and goals of care.   Clinical Assessment and Goals of Care: Extensive chart review completed prior to meeting patient including labs, vital signs, imaging, progress notes, orders, and available advanced directive documents from current and previous encounters. I then met with patient, his wife Evlyn, and his son Rockey at bedside to discuss diagnosis prognosis, GOC, EOL wishes, disposition and options.  I introduced Palliative Medicine as specialized medical care for people living with serious illness. It focuses on providing relief from the symptoms and stress of a serious illness. The goal is to improve quality of life for both the patient and the family.  We discussed a brief life review of the patient.  Patient was a psychologist, occupational.  He has been married for 41 years and has 3 children.  We discussed patient's current illness-recent BKA-and what it means in the larger  context of patient's on-going co-morbidities-chronic pain, extensive PAD, and depression.    I attempted to elicit values and goals of care important to the patient.  Patient's wife states that she thinks he is given up.  She shares patient says often that he is tired-globally tired and not in need of just rest.  She shares concern that he has a strong man and he does not realize how strong he actually is.  Patient awakens during our discussion and joins that conversation.  He states that he does not want to go to rehab and has no interest in being a part of any program to help him get stronger.  He states his wishes are to go home and to be left alone.  He would like to be free from pain or have his pain and this minimizes possible.  He states he does not want any aggressive measures.  He remains frustrated that he was a DNR and that they perform CPR on him.  He shares he understands why his wife did it.  However, he wants to make it very clear that he would never be accepting of CPR or a ventilator ever again.  I started him a DNR with limited interventions  remain.  Discussion had with wife in regards to not necessarily agreeing with patient's decision but supporting that it is decision for his own body.  He has full autonomy over his medical decision making at this time.  He had light of patient saying that he would like to go home and have his symptoms managed, and introduced the the concept of comfort care and hospice.  Hospice philosophy, hospice at home, and hospice inpatient unit discussed in detail.  We discussed comfort measures. Patient would no longer receive aggressive medical interventions such as continuous vital signs, lab work, radiology testing, or medications not focused on comfort. All care would focus on how he is looking and feeling. This would include management of any symptoms that may cause discomfort, pain, shortness of breath, cough, nausea, agitation, anxiety, and/or secretions  etc. Symptoms would be managed with medications and other non-pharmacological interventions such as spiritual support if requested, repositioning, music therapy, or therapeutic listening.   Family verbalized understanding and appreciation.  Patient and family said they would like to discuss it further before making any final decisions or changes to plan of care.  Discussed patient's phantom pain.  He shares that he knows his foot is not there but it feels as though his now missing ankle is in a vice.  We discussed phantom pain.  In review of MAR.  I would recommend the following:  - Robaxin  500 mg p.o. every 6 hours as needed  - Senna 1 tab daily  - Scheduled either Percocet or Dilaudid -patient endorses Dilaudid  IV works well for him and that Percocet does not seem to touch the pain.  I recommend initiating oral dose of Dilaudid  at 2 mg every 6 hours. (Patient utilized 22.5 mEq of morphine  yesterday.  2 mg every 6 hours is slight increase in the patient's narcotic load but could potentially more consistently and better control his pain)  - Lyrica  increased to 150 twice daily  PMT will continue to follow and support patient throughout his hospitalization.  Above recommendations conveyed to attending via secure chat.  I plan to meet with the patient, his wife, and his children bedside at 10 AM tomorrow to continue goals of care discussions.  Primary Decision Maker PATIENT  Physical Exam Vitals reviewed.  Constitutional:      General: He is not in acute distress.    Appearance: He is normal weight.  HENT:     Head: Normocephalic.     Nose: Nose normal.     Mouth/Throat:     Mouth: Mucous membranes are moist.  Eyes:     Pupils: Pupils are equal, round, and reactive to light.  Cardiovascular:     Rate and Rhythm: Normal rate.     Pulses: Normal pulses.  Pulmonary:     Effort: Pulmonary effort is normal.  Abdominal:     Palpations: Abdomen is soft.  Musculoskeletal:     Comments:  Right BKA  Skin:    General: Skin is warm and dry.  Neurological:     Mental Status: He is alert and oriented to person, place, and time.  Psychiatric:        Mood and Affect: Mood normal.        Behavior: Behavior normal.        Thought Content: Thought content normal.        Judgment: Judgment normal.     Palliative Assessment/Data: 50%     Thank you for this consult. Palliative medicine will continue to follow  and assist holistically.   Time Total: 75 minutes  Time spent includes: Detailed review of medical records (labs, imaging, vital signs), medically appropriate exam (mental status, respiratory, cardiac, skin), discussed with treatment team, counseling and educating patient, family and staff, documenting clinical information, medication management and coordination of care.  Signed by: Lamarr Gunner, DNP, FNP-BC Palliative Medicine   Please contact Palliative Medicine Team providers via [facility] for questions and concerns.

## 2023-04-30 NOTE — Progress Notes (Signed)
 Date of Admission:  04/18/2023  Manuel [name] is a 78 y.o. 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, followed by TMA in oct 2024 presented with infected TMA site Pt has complicated PAD history and foot infection    he had endarterectomies of both rt and left common, profunda and superficial femoral artery, fogary embolectomy of rt SFA, stent placement in b/l common iliac arteries, left external iliac and Rt PTA, on 11/15/22  Had wound dehiscence on the rt groin  , he underwent debridement of the wound on the rt groin on 01/09/23 with excision of 15 cm2 of skin, soft tissue, muscle fascia and application of neg pressure vac He had serratia , staph aureus and ecoli in culture and received Iv antibiotic followed by PO cipro + Augmentin  He also underwent TMA on 02/17/23 He followed with Podiatrist as OP On 12/24 he came to the ED with rt knee and leg pain after he fell out of bed. The TMA site was red , weeping and and he was started on keflex  and bactrim  and discharged from ED. Xray was done in the ED on 12/14 and it did not show any fracture.   He returned to the ED with rt leg swelling upto thigh , pain and confusion   ID: Manuel [name] is a 78 y.o. male  Principal Problem:   Osteomyelitis (HCC) Active Problems:   Hypertension   Coronary artery disease with history of myocardial infarction without history of CABG   Diabetes mellitus type 2, insulin  dependent (HCC)   Mixed hyperlipidemia   Lower limb ischemia right leg with history of angioplasty and stent on 05/14/2021   Chronic combined systolic and diastolic CHF (congestive heart failure) (HCC)   Acute kidney injury superimposed on chronic kidney disease (HCC)   Hyponatremia   Uncontrolled type 2 diabetes mellitus with hyperglycemia, with long-term current use of insulin  (HCC)   Essential hypertension   Dyslipidemia   BPH (benign prostatic hyperplasia)   Acute  metabolic encephalopathy   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)   Diabetic infection of right foot (HCC)   Wound infection   Current severe episode of major depressive disorder without psychotic features (HCC)  Pt had preciptius drop in BP with ambulation to chair and then arrest called upon return to bed- received 5 min of CPR-refused PRBC  Subjective: Sleeping Wife at bed side  Medications:   sodium chloride    Intravenous Once   aspirin  EC  81 mg Oral Daily   atorvastatin   80 mg Oral QHS   carvedilol   25 mg Oral BID WC   clopidogrel   75 mg Oral Daily   dapagliflozin  propanediol  10 mg Oral Daily   DULoxetine   20 mg Oral BID   enoxaparin  (LOVENOX ) injection  40 mg Subcutaneous Q24H   folic acid   1 mg Oral Daily   insulin  aspart  0-20 Units Subcutaneous TID WC   insulin  aspart  0-5 Units Subcutaneous QHS   insulin  glargine-yfgn  17 Units Subcutaneous Daily   iron  polysaccharides  150 mg Oral Daily   multivitamin with minerals  1 tablet Oral Daily   nystatin   5 mL Oral QID   polyethylene glycol  17 g Oral Daily   pregabalin   75 mg Oral BID   Ensure Max Protein  11 oz Oral Daily   sacubitril -valsartan   1 tablet Oral Q12H   spironolactone   50 mg  Oral Daily   tamsulosin   0.4 mg Oral QPC supper   thiamine   100 mg Oral Daily   traZODone   100 mg Oral QHS    Objective: Vital signs in last 24 hours: Patient Vitals for the past 24 hrs:  BP Temp Temp src Pulse Resp SpO2  04/30/23 1013 121/63 -- -- 66 19 100 %  04/30/23 0827 (!) 96/41 98.4 F (36.9 C) -- 65 18 96 %  04/29/23 2215 124/64 97.6 F (36.4 C) -- 65 19 97 %  04/29/23 1538 139/74 98.4 F (36.9 C) -- 70 18 98 %  04/29/23 1436 (!) 125/90 (!) 97.5 F (36.4 C) Oral 64 20 99 %  04/29/23 1412 118/63 98.2 F (36.8 C) Oral 63 18 99 %  04/29/23 1355 121/63 98.2 F (36.8 C) -- 63 19 97 %  04/29/23 1336 128/65 97.8 F (36.6 C) Oral 63 18 94 %  04/29/23 1314 (!) 90/52 -- -- 83 20 92 %      PHYSICAL EXAM:   General: resting in bed Opens eyes,  Lungs:b/l air entry Heart: s1s2 Extremities: rt BKA- Rt knee mild edema- superficial abrasion  Skin: No rashes or lesions. Or bruising Neurologic: Grossly non-focal  Lab Results    Latest Ref Rng & Units 04/30/2023    5:31 AM 04/29/2023    5:21 AM 04/28/2023    5:44 AM  CBC  WBC 4.0 - 10.5 K/uL 7.7  7.9  10.7   Hemoglobin 13.0 - 17.0 g/dL 7.2  7.2  7.5   Hematocrit 39.0 - 52.0 % 22.8  21.9  23.1   Platelets 150 - 400 K/uL 486  456  453        Latest Ref Rng & Units 04/30/2023    5:31 AM 04/29/2023    5:21 AM 04/28/2023    5:44 AM  CMP  Glucose 70 - 99 mg/dL 818  868  876   BUN 8 - 23 mg/dL 25  28  28    Creatinine 0.61 - 1.24 mg/dL 9.17  9.18  9.09   Sodium 135 - 145 mmol/L 134  131  134   Potassium 3.5 - 5.1 mmol/L 5.0  4.7  4.9   Chloride 98 - 111 mmol/L 103  102  105   CO2 22 - 32 mmol/L 22  19  21    Calcium  8.9 - 10.3 mg/dL 8.6  8.4  8.2       Microbiology: Synovial fluid- no organisms seen  Culture neg so far    Assessment/Plan:  ?Rt TMA site infection- underwent BKA Wound culture was MSSA   Rt knee septic arthritis- s/p I/D culture NG so far  On ceftriaxone  and vanco- - culture no growth - continue both antibiotics by IV for a minimum of 2 weeks ( 05/06/23)  and then can switch to PO cefudroxil + Doxy   DM- management as per primary team   PAD-- h/o endarterectomies of both rt and left common, profunda and superficial femoral artery, fogary embolectomy of rt SFA, stent placement in b/l common iliac arteries, left external iliac and Rt PTA, on 11/15/22    AKI- resolved   Anemia   Intermittent and chronic  left sided abdominal pain-management as per hospitalist In 2023 Ct  showed colonic diverticulosis, saccular aneursym of 2.3 cm and superior end plate fracture of l1  Discussed the management with  wife

## 2023-04-30 NOTE — Consult Note (Addendum)
 Attempted assessment with pt. Pt sleeping. Wife, Evlyn, at bedside. Spoke w/ Evlyn and she requested assessment be re-attempted at a later time as pt has not been sleeping well. Spoke w/ Evlyn and pt's son, Rockey, who expressed concerns that pt is not motivated for treatment and has been complaining of phantom limb pain at below the knee amputation. Kyle asked whether pt's DNR/DNI status would remain in the context of IVC. When asked whether he was in disagreement with DNR/DNI remaining, stated he was not but was curious. This was staffed with both attending psychiatrist, Dr. Victoria, as well as medical director, Dr. Jason, and pt has right for DNR/DNI.

## 2023-04-30 NOTE — Progress Notes (Signed)
 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 Patient is experiencing mild pain this AM.  Patient states that yesterday he experienced a cardiac arrest and was resuscitated.  States that he would like to discuss his medical care and interventions regarding a MOLST form.  Has a plan to meet with palliative care later today to discuss these interventions. Fever:  No recent fevers. Gastrointestinal:Negative for nausea and vomiting Patient has had a BM.  Objective: Vital signs in last 24 hours: Temp:  [97.5 F (36.4 C)-98.4 F (36.9 C)] 98.4 F (36.9 C) (01/08 0827) Pulse Rate:  [63-83] 66 (01/08 1013) Resp:  [18-20] 19 (01/08 1013) BP: (90-139)/(41-90) 121/63 (01/08 1013) SpO2:  [92 %-100 %] 100 % (01/08 1013)  Intake/Output from previous day:  Intake/Output Summary (Last 24 hours) at 04/30/2023 1131 Last data filed at 04/30/2023 0700 Gross per 24 hour  Intake 1500 ml  Output 750 ml  Net 750 ml    Intake/Output this shift: No intake/output data recorded.  Labs: Recent Labs    04/28/23 0544 04/29/23 0521 04/30/23 0531  HGB 7.5* 7.2* 7.2*   Recent Labs    04/29/23 0521 04/30/23 0531  WBC 7.9 7.7  RBC 2.55* 2.59*  HCT 21.9* 22.8*  PLT 456* 486*   Recent Labs    04/29/23 0521 04/30/23 0531  NA 131* 134*  K 4.7 5.0  CL 102 103  CO2 19* 22  BUN 28* 25*  CREATININE 0.81 0.82  GLUCOSE 131* 181*  CALCIUM  8.4* 8.6*   No results for input(s): LABPT, INR in the last 72 hours.  EXAM General - Patient is resting but does appear in more discomfort this AM. Extremity - ACE Wrap a in place to the right lower extremity this morning. No drainage noted to the ACE wrap this AM.  Exposed knee incision without drainage or erythema.  Reapplied dressing over incision. Improved discomfort with palpation around the right knee this AM compared to yesterday. Abdomen soft to  palpation.  Past Medical History:  Diagnosis Date   Acute ST elevation myocardial infarction (STEMI) of inferior wall (HCC) 04/19/2010   a.) transfered from [facility] --> LHC/PCI (very difficult procedure) --> 3.0 x 23 mm and 3.0 x 12 mm Xience stents to RCA   Allergies    Arthritis    Benign essential hypertension    Bilateral carotid artery disease (HCC) 05/08/2021   a.) carotid doppler 05/08/2021: 1-39% BICA   CAD (coronary artery disease) 04/19/2010   a.) inferior STEMI 04/19/2010 --> LHC/PCI: 50-70% pD1, 80% pRI, 90/90/90% RCA (overlapping 3.0 x 23 and 3.0 x 12 mm Xience DES); b.) MV 11/10/2018: fixed minimally reversible inferior perfusion defect   Cellulitis of foot    DDD (degenerative disc disease), cervical    Diabetes mellitus type 2, insulin  dependent (HCC)    Diverticulosis    Full dentures    Gout    Hard of hearing    History of bilateral cataract extraction 2022   History of ETOH abuse    Hyperlipidemia    Ischemic cardiomyopathy 04/19/2010   a.) TTE 04/19/2010: 40%; b.) TTE 04/20/2014: EF >55%, mild RVE, triv PR, mild MR/TR, G1DD; c.) TTE 11/10/2018: EF 45%, inf HK, mild RVE, triv TR/PR, mild MR, G1DD; d.) TTE 11/14/2022: EF 25-30%, basal anteroseptal, apical lateral, apical septal, and apex AK, mid anterolateral HK, mild-mod MR/TR   Long term current use of aspirin   Long term current use of clopidogrel     Lumbar degenerative disc disease    Lumbar radiculopathy    Lumbar vertebral fracture (chronic superior endplate of L1)    OSA (obstructive sleep apnea)    a.) unable to tolerate nocturnal PAP therapy   Peripheral artery disease (HCC)    a.) stenting 05/14/21: 12 mm x 12 cm LifeStent RIGHT dis SFA/prox pop; b.) s/p cath directed thrombolysis RIGHT SFA/pop 06/14/21; c.) s/p mech thrombectomy + stenting 06/15/21: 8 mm x 25cm & 8 mm x 7.5cm Viabahn; d.) s/p BILAT CFA, profunda femoris, SFA endarterectomies + fogarty embolectomy + stenting 11/15/22: 12mm x 58mm  Lifestream BILAT CIAs, 14 mm x 6 cm Lifestream & 13 mm x 5 cm Viabahn LEFT EIA   Peripheral neuropathy    Umbilical hernia    Assessment/Plan: 9 Days Post-Op Procedure(s) (LRB): IRRIGATION AND DEBRIDEMENT KNEE (Right) Principal Problem:   Osteomyelitis (HCC) Active Problems:   Hypertension   Coronary artery disease with history of myocardial infarction without history of CABG   Diabetes mellitus type 2, insulin  dependent (HCC)   Mixed hyperlipidemia   Lower limb ischemia right leg with history of angioplasty and stent on 05/14/2021   Chronic combined systolic and diastolic CHF (congestive heart failure) (HCC)   Acute kidney injury superimposed on chronic kidney disease (HCC)   Hyponatremia   Uncontrolled type 2 diabetes mellitus with hyperglycemia, with long-term current use of insulin  (HCC)   Essential hypertension   Dyslipidemia   BPH (benign prostatic hyperplasia)   Acute metabolic encephalopathy   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   Weight as of this encounter: 83.9 kg. Up with therapy as tolerated.  Patient experienced a cardiac event yesterday on 04/29/2023.  Look to discussed MOLST interventions with palliative care later today  No recent fevers. Hemoglobin at 7.2 this AM.  Continue to monitor. WBC at 7.7 this AM.  No growth noted on gram stain and culture. Continue on Rocephin  and Vacomycin per infectious disease. WIll defer to vascular for wound changes to the right BKA. Per Locums, staples to be removed in 2-3 weeks.  DVT Prophylaxis - Lovenox  and Plavix  NWB to right lower extremity.  Fonda CHARLENA Koyanagi, PA-C [facility] Orthopaedic Surgery 04/30/2023, 11:31 AM

## 2023-04-30 NOTE — Progress Notes (Signed)
 PT Cancellation Note  Patient Details Name: DAVONN FLANERY MRN: [id number] DOB: 1945/10/14   Cancelled Treatment:    Reason Eval/Treat Not Completed: Patient declined, no reason specified Patient wants to rest, having pain in ribs and R knee. Will re-attempt later as time allows.   Doreather Hoxworth 04/30/2023, 11:14 AM

## 2023-04-30 NOTE — Plan of Care (Signed)
  Problem: Respiratory: Goal: Ability to maintain adequate ventilation will improve Outcome: Progressing   Problem: Coping: Goal: Ability to adjust to condition or change in health will improve Outcome: Progressing

## 2023-05-01 ENCOUNTER — Other Ambulatory Visit (INDEPENDENT_AMBULATORY_CARE_PROVIDER_SITE_OTHER): Payer: Self-pay | Admitting: Nurse Practitioner

## 2023-05-01 DIAGNOSIS — I5042 Chronic combined systolic (congestive) and diastolic (congestive) heart failure: Secondary | ICD-10-CM | POA: Diagnosis not present

## 2023-05-01 DIAGNOSIS — T148XXA Other injury of unspecified body region, initial encounter: Secondary | ICD-10-CM | POA: Diagnosis not present

## 2023-05-01 DIAGNOSIS — M8600 Acute hematogenous osteomyelitis, unspecified site: Secondary | ICD-10-CM | POA: Diagnosis not present

## 2023-05-01 DIAGNOSIS — I998 Other disorder of circulatory system: Secondary | ICD-10-CM | POA: Diagnosis not present

## 2023-05-01 DIAGNOSIS — N179 Acute kidney failure, unspecified: Secondary | ICD-10-CM | POA: Diagnosis not present

## 2023-05-01 LAB — GLUCOSE, CAPILLARY
Glucose-Capillary: 164 mg/dL — ABNORMAL HIGH (ref 70–99)
Glucose-Capillary: 194 mg/dL — ABNORMAL HIGH (ref 70–99)
Glucose-Capillary: 170 mg/dL — ABNORMAL HIGH (ref 70–99)
Glucose-Capillary: 178 mg/dL — ABNORMAL HIGH (ref 70–99)

## 2023-05-01 MED ORDER — METHOCARBAMOL 500 MG PO TABS
500.0000 mg | ORAL_TABLET | Freq: Three times a day (TID) | ORAL | Status: DC
  Administered 2023-05-01 – 2023-05-08 (×21): 500 mg via ORAL
  Filled 2023-05-01 (×21): qty 1

## 2023-05-01 MED ORDER — HYDROMORPHONE HCL 2 MG PO TABS
2.0000 mg | ORAL_TABLET | Freq: Four times a day (QID) | ORAL | Status: DC
  Administered 2023-05-01 – 2023-05-08 (×24): 2 mg via ORAL
  Filled 2023-05-01 (×27): qty 1

## 2023-05-01 NOTE — Plan of Care (Signed)
°  Problem: Clinical Measurements: Goal: Diagnostic test results will improve Outcome: Progressing   Problem: Nutrition: Goal: Adequate nutrition will be maintained Outcome: Progressing   Problem: Pain Management: Goal: General experience of comfort will improve Outcome: Progressing   Problem: Safety: Goal: Ability to remain free from injury will improve Outcome: Progressing

## 2023-05-01 NOTE — Progress Notes (Signed)
 Palliative Care Progress Note, Assessment & Plan   Patient Name: Manuel [name]       Date: 05/01/2023 DOB: [birth year]  Age: 78 y.o. MRN#: [id number] Attending Physician: Trudy Anthony HERO, MD Primary Care Physician: Rudolpho Norleen BIRCH, MD Admit Date: 04/18/2023  Subjective: Patient is lying in bed, sleeping, in no apparent distress.  He easily awakens to my presence.  He acknowledges my presence and is able to make his wishes known.  His wife and son are at bedside during my visit.  HPI: 78 y.o. 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  He then underwent right knee I&D for septic/gouty arthritis on 12/30.   Vascular surgery is recommending aspirin  daily with Plavix  and Lipitor .   ID is consulted for antibiotic recommendations.   PMT was consulted to discuss boundaries and goals of care.  Summary of counseling/coordination of care: Extensive chart review completed prior to meeting patient including labs, vital signs, imaging, progress notes, orders, and available advanced directive documents from current and previous encounters.   After reviewing the patient's chart and assessing the patient at bedside, I spoke with the patient, his wife and son in person, and his daughter Jeoffrey over speaker phone in regards to symptom management and goals of  care.  Symptoms assessed.  Patient continues to have phantom right leg pain, low back pain, and tightness in aching in his right hip and right thigh.  In review of MAR, patient has been utilizing IV morphine  consistently.  Discussed use of p.o. medications to better manage patient's pain as this is not manageable with IV medication outside of the hospital  I highlighted that patient is on standard therapy for phantom pain which is tricyclic antidepressants.  Discussed that these take some time for them to build up in his system and to reach steady state.  I counseled with attending who is in agreement for the following adjustments:  -Dilaudid  p.o. 2 mg every 6 hours -Robaxin  500 mg p.o. every 6 hours -IV morphine  remains for severe and breakthrough pain   Made changed to medication regimen and discussed adjustments to [facility] with RN.  I then discussed goals of care with patient and family.  Patient vocalized that he does not want to go to rehab but rather return home.  We discussed that returning home means he would likely be bedbound given he is not given any therapy did not had appropriately transfer.  Additionally, discussed that if patient is more bedbound, this is more likely to result in infections such as pneumonia, bedsores, and an overall poor prognosis.  I outlined that an alternative plan would be for patient to attend rehab, learn how to appropriately transfer and be safe with mobility.  I discussed that rehab is not a  magic wand to miraculously teach him how to walk up and down 10 flights of steps to run a marathon.  However, the goal would be for him to have an improved quality of life upon returning home.  Space and opportunity provided for family and patient to share their thoughts and emotions regarding patient's current medical situation.  Therapeutic silence, active listening, and emotional support provided.  Patient states that he wants to give rehab a try.  He shares he knows he  is not actively dying and wants to give it his best shot.  We discussed hospice services at home as well as continue with current plan of care/treating the treatable/going to rehab.  Patient was clear that he is choosing a path towards rehab and avoiding a comfort care path at this time.  DNR with limited interventions is in place - no longer dnr-comfort measures.    I notified attending and RN of patient's request to continue with PT/OT and be evaluated for rehab placement.  I also conveyed to attending that patient's family is requesting that the suicide sitter be discontinued.  I defer to psych to make this adjustment.  PMT remains available to patient and family throughout his hospitalization.  Physical Exam Vitals reviewed.  Constitutional:      General: He is not in acute distress.    Appearance: He is normal weight.  HENT:     Head: Normocephalic.     Mouth/Throat:     Mouth: Mucous membranes are moist.  Eyes:     Pupils: Pupils are equal, round, and reactive to light.  Pulmonary:     Effort: Pulmonary effort is normal.  Abdominal:     Palpations: Abdomen is soft.  Musculoskeletal:     Comments: Generalized weakness R BKA  Skin:    General: Skin is warm and dry.  Neurological:     Mental Status: He is alert and oriented to person, place, and time.  Psychiatric:        Mood and Affect: Mood normal.        Behavior: Behavior normal.        Thought Content: Thought content normal.        Judgment: Judgment normal.             Total Time 90 minutes   Time spent includes: Detailed review of medical records (labs, imaging, vital signs), medically appropriate exam (mental status, respiratory, cardiac, skin), discussed with treatment team, counseling and educating patient, family and staff, documenting clinical information, medication management and coordination of care.  Lamarr L. Arvid, DNP, FNP-BC Palliative Medicine Team

## 2023-05-01 NOTE — Progress Notes (Signed)
 Physical Therapy Treatment Patient Details Name: Manuel [name] MRN: [id number] DOB: 1946-03-21 Today's Date: 05/01/2023   History of Present Illness Pt is a 78 y.o. male presenting to hospital 04/19/23 with c/o AMS, weakness, fall, and R leg swelling/pain. Recently seen in ED for fall onto R knee causing effusion and pain; pt s/p R foot TMA 2 months prior. Pt admitted with sepsis d/t cellulitis, AKI superimposed on CKD, hyponatremia, acute metabolic encephalopathy. 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 S/p R knee aspiration 04/20/23. S/p R knee joint I&D 04/21/23 d/t R septic knee. 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    PT Comments  Pt sleeping in bed, however easily awakened.  Pt agreeable to therapy although pt had been up in the chair for a couple of hours earlier with nursing.  Pt performed bed mobility with good technique and only required verbal cuing for hand placement.  Pt able to perform STS x3 with modA from therapist and was able to stay balanced >10 sec on each attempt.  Pt very fatigued and noted chest pain from where resuscitation occurred and pt noted having broken ribs.  Pt assisted back to bed and pillow placed under nub to promote healing of the LE into extension.  Pt left with all needs met, call bell within reach, and sitter in room.  D/c recommendations changed at this time.     If plan is discharge home, recommend the following: Assistance with cooking/housework;Direct supervision/assist for medications management;Direct supervision/assist for financial management;Assist for transportation;Help with stairs or ramp for entrance;Supervision due to cognitive status;Two people to help with walking and/or transfers;Two people to help with bathing/dressing/bathroom   Can travel by private vehicle     No  Equipment Recommendations  Other (comment)    Recommendations for Other Services  OT consult     Precautions / Restrictions Precautions Precautions: Fall Restrictions Weight Bearing Restrictions Per Provider Order: Yes RLE Weight Bearing Per Provider Order: Non weight bearing Other Position/Activity Restrictions: R BKA     Mobility  Bed Mobility Overal bed mobility: Needs Assistance       Supine to sit: Supervision     General bed mobility comments: supervision with bed mobility and verbal cuing for hand placement.    Transfers Overall transfer level: Needs assistance Equipment used: Rolling walker (2 wheels) Transfers: Sit to/from Stand Sit to Stand: Mod assist           General transfer comment: STS x3 with pt standing for >10 sec at each attempt.    Ambulation/Gait               General Gait Details: deferred due to the weakness in the LE's he felt and the chest pain from being revived.   Stairs             Wheelchair Mobility     Tilt Bed    Modified Rankin (Stroke Patients Only)       Balance Overall balance assessment: Needs assistance Sitting-balance support: No upper extremity supported, Feet supported Sitting balance-Leahy Scale: Good     Standing balance support: During functional activity, Reliant on assistive device for balance, Bilateral upper extremity supported Standing balance-Leahy Scale: Fair Standing balance comment: heavy BUE support on RW in standing for balance  Cognition Arousal: Alert Behavior During Therapy: WFL for tasks assessed/performed Overall Cognitive Status: Within Functional Limits for tasks assessed                                          Exercises      General Comments        Pertinent Vitals/Pain Pain Assessment Pain Assessment: No/denies pain    Home Living                          Prior Function            PT Goals (current goals can now be found in the care plan section) Acute Rehab PT  Goals Patient Stated Goal: to feel better and have less pain PT Goal Formulation: With patient/family Time For Goal Achievement: 05/06/23 Potential to Achieve Goals: Fair Progress towards PT goals: Progressing toward goals    Frequency    Min 1X/week      PT Plan      Co-evaluation              AM-PAC PT 6 Clicks Mobility   Outcome Measure  Help needed turning from your back to your side while in a flat bed without using bedrails?: A Little Help needed moving from lying on your back to sitting on the side of a flat bed without using bedrails?: A Little Help needed moving to and from a bed to a chair (including a wheelchair)?: A Lot Help needed standing up from a chair using your arms (e.g., wheelchair or bedside chair)?: A Lot Help needed to walk in hospital room?: Total Help needed climbing 3-5 steps with a railing? : Total 6 Click Score: 12    End of Session   Activity Tolerance: Treatment limited secondary to medical complications (Comment) Patient left: with call bell/phone within reach;with chair alarm set;in bed;with nursing/sitter in room Nurse Communication: Mobility status;Precautions;Other (comment) PT Visit Diagnosis: Other abnormalities of gait and mobility (R26.89);Muscle weakness (generalized) (M62.81);History of falling (Z91.81);Pain Pain - Right/Left: Right Pain - part of body: Knee;Leg     Time: 8457-8397 PT Time Calculation (min) (ACUTE ONLY): 20 min  Charges:    $Therapeutic Activity: 8-22 mins PT General Charges $$ ACUTE PT VISIT: 1 Visit                     Fonda Simpers, PT, DPT Physical Therapist - [facility]  05/01/23, 4:19 PM

## 2023-05-01 NOTE — Progress Notes (Signed)
 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 Family present at bedside. Patient up in the chair Patient is experiencing mild pain this AM that is worse with any palpation.  Denies any CP or SOB today. Fever:  No recent fevers. Gastrointestinal:Negative for nausea and vomiting Patient has had a BM.  Objective: Vital signs in last 24 hours: Temp:  [97.6 F (36.4 C)-97.8 F (36.6 C)] 97.7 F (36.5 C) (01/09 0716) Pulse Rate:  [57-71] 59 (01/09 0716) Resp:  [16-18] 18 (01/09 0716) BP: (120-132)/(61-69) 124/62 (01/09 0716) SpO2:  [93 %-98 %] 98 % (01/09 0716)  Intake/Output from previous day:  Intake/Output Summary (Last 24 hours) at 05/01/2023 1208 Last data filed at 05/01/2023 1119 Gross per 24 hour  Intake 880 ml  Output 1700 ml  Net -820 ml    Intake/Output this shift: Total I/O In: 480 [P.O.:480] Out: 800 [Urine:800]  Labs: Recent Labs    04/29/23 0521 04/30/23 0531  HGB 7.2* 7.2*   Recent Labs    04/29/23 0521 04/30/23 0531  WBC 7.9 7.7  RBC 2.55* 2.59*  HCT 21.9* 22.8*  PLT 456* 486*   Recent Labs    04/29/23 0521 04/30/23 0531  NA 131* 134*  K 4.7 5.0  CL 102 103  CO2 19* 22  BUN 28* 25*  CREATININE 0.81 0.82  GLUCOSE 131* 181*  CALCIUM  8.4* 8.6*   No results for input(s): LABPT, INR in the last 72 hours.  EXAM General - Patient is resting but does appear in more discomfort this AM. Extremity - ACE Wrap a in place to the right lower extremity this morning. ACE wrap removed. No drainage noted, incision well approximated and staples in place. ACE wrap reapplied.  Exposed knee incision without drainage or erythema.  Reapplied dressing over incision. Abdomen soft to palpation.  Past Medical History:  Diagnosis Date   Acute ST elevation myocardial infarction (STEMI) of inferior wall (HCC) 04/19/2010   a.) transfered from [facility] -->  LHC/PCI (very difficult procedure) --> 3.0 x 23 mm and 3.0 x 12 mm Xience stents to RCA   Allergies    Arthritis    Benign essential hypertension    Bilateral carotid artery disease (HCC) 05/08/2021   a.) carotid doppler 05/08/2021: 1-39% BICA   CAD (coronary artery disease) 04/19/2010   a.) inferior STEMI 04/19/2010 --> LHC/PCI: 50-70% pD1, 80% pRI, 90/90/90% RCA (overlapping 3.0 x 23 and 3.0 x 12 mm Xience DES); b.) MV 11/10/2018: fixed minimally reversible inferior perfusion defect   Cellulitis of foot    DDD (degenerative disc disease), cervical    Diabetes mellitus type 2, insulin  dependent (HCC)    Diverticulosis    Full dentures    Gout    Hard of hearing    History of bilateral cataract extraction 2022   History of ETOH abuse    Hyperlipidemia    Ischemic cardiomyopathy 04/19/2010   a.) TTE 04/19/2010: 40%; b.) TTE 04/20/2014: EF >55%, mild RVE, triv PR, mild MR/TR, G1DD; c.) TTE 11/10/2018: EF 45%, inf HK, mild RVE, triv TR/PR, mild MR, G1DD; d.) TTE 11/14/2022: EF 25-30%, basal anteroseptal, apical lateral, apical septal, and apex AK, mid anterolateral HK, mild-mod MR/TR   Long term current use of aspirin     Long term current use of clopidogrel     Lumbar degenerative disc disease    Lumbar radiculopathy    Lumbar vertebral fracture (chronic superior endplate  of L1)    OSA (obstructive sleep apnea)    a.) unable to tolerate nocturnal PAP therapy   Peripheral artery disease (HCC)    a.) stenting 05/14/21: 12 mm x 12 cm LifeStent RIGHT dis SFA/prox pop; b.) s/p cath directed thrombolysis RIGHT SFA/pop 06/14/21; c.) s/p mech thrombectomy + stenting 06/15/21: 8 mm x 25cm & 8 mm x 7.5cm Viabahn; d.) s/p BILAT CFA, profunda femoris, SFA endarterectomies + fogarty embolectomy + stenting 11/15/22: 12mm x 58mm Lifestream BILAT CIAs, 14 mm x 6 cm Lifestream & 13 mm x 5 cm Viabahn LEFT EIA   Peripheral neuropathy    Umbilical hernia    Assessment/Plan: 10 Days Post-Op Procedure(s)  (LRB): IRRIGATION AND DEBRIDEMENT KNEE (Right) Principal Problem:   Osteomyelitis (HCC) Active Problems:   Hypertension   Coronary artery disease with history of myocardial infarction without history of CABG   Diabetes mellitus type 2, insulin  dependent (HCC)   Mixed hyperlipidemia   Lower limb ischemia right leg with history of angioplasty and stent on 05/14/2021   Chronic combined systolic and diastolic CHF (congestive heart failure) (HCC)   Acute kidney injury superimposed on chronic kidney disease (HCC)   Hyponatremia   Uncontrolled type 2 diabetes mellitus with hyperglycemia, with long-term current use of insulin  (HCC)   Essential hypertension   Dyslipidemia   BPH (benign prostatic hyperplasia)   Acute metabolic encephalopathy   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   Weight as of this encounter: 83.9 kg. Up with therapy as tolerated.  Patient experienced a cardiac event on 04/29/2023. Patient is DNR.  No recent fevers. Last Hemoglobin at 7.2.  Continue to monitor. WBC at 7.7.  No growth noted on gram stain and culture. Continue on Rocephin  and Vacomycin per infectious disease. WIll defer to vascular for wound changes to the right BKA. Per Locums, staples to be removed in 2-3 weeks.  DVT Prophylaxis - Lovenox  and Plavix  NWB to right lower extremity.  Fonda CHARLENA Koyanagi, PA-C [facility] Orthopaedic Surgery 05/01/2023, 12:08 PM

## 2023-05-01 NOTE — Progress Notes (Addendum)
 PROGRESS NOTE    Manuel [name]  FMW:[id number] DOB: 06/11/1945 DOA: 04/18/2023 PCP: Rudolpho Norleen BIRCH, MD    Assessment & Plan:   Principal Problem:   Osteomyelitis [facility]) Active Problems:   Acute kidney injury superimposed on chronic kidney disease (HCC)   Hyponatremia   Hypertension   Acute metabolic encephalopathy   Diabetes mellitus type 2, insulin  dependent (HCC)   Uncontrolled type 2 diabetes mellitus with hyperglycemia, with long-term current use of insulin  (HCC)   Mixed hyperlipidemia   Chronic combined systolic and diastolic CHF (congestive heart failure) (HCC)   Coronary artery disease with history of myocardial infarction without history of CABG   Lower limb ischemia right leg with history of angioplasty and stent on 05/14/2021   Essential hypertension   Dyslipidemia   BPH (benign prostatic hyperplasia)   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 Continue on IV rocephin  until 05/06/23 then cefudroxil & bactrim  due to septic arthritis as per ID.   Right septic knee: with gouty arthritis. S/p R knee I&D on 12/30. Cx growing MSSA. Holding colchicine . Continue on IV rocephin  as per ID    Suicidal ideation: continue w/ sitter. Needs inpatient psych as per psych. Hx of suicidal ideation in the past    Orthostasis with possible arrest: with precipitous BP drop with ambulation to the chair and then called arrest upon return to bed. S/p did receive about 5 minutes of CPR (nursing reports that his wife requested this despite DNR status) despite DNR status. No further CPR is appropriate - discussed with patient and his wife   Acute blood loss anemia: stable. Will transfuse if Hb < 7.0    Intermittent L side pain:  chronic. Etiology unclear. Will continue to monitor. Wife reports that this is a chronic issue but seems to worsen after surgeries  Hyponatremia: chronic. Labile.    Chronic systolic heart failure with a last EF of 30% in October 202. Hx of ischemic cardiomyopathy. Continue on aldactone , coreg , entresto , farxiga . Holding torsemide     Acute metabolic encephalopathy: likely multifactorial in the setting of hypovolemia and infectious etiology. Re-orient prn    DM2: poorly controlled. Continue on glargine, SSI w/ accuchecks    BPH: continue on tamsulosin     HLD: continue on statin    PVD: continue on plavix , aspirin , statin   HTN: continue on coreg , entresto , aldactone . Holding torsemide    Chronic pain: continue on percocet. Morphine  prn      DVT prophylaxis: lovenox   Code Status: full  Family Communication: discussed pt's care w/ pt's family at bedside and answered their questions  Disposition Plan: pt is going to rehab  Level of care: Med-Surg  Status is: Inpatient Remains inpatient appropriate because: severity of illness    Consultants:  Palliative care  ID Psych  Ortho surg   Procedures:   Antimicrobials: rocephin    Subjective: Pt c/o fatigue   Objective: Vitals:   04/30/23 1013 04/30/23 1712 04/30/23 2125 05/01/23 0716  BP: 121/63 132/69 120/61 124/62  Pulse: 66 71 (!) 57 (!) 59  Resp: 19  16 18   Temp:  97.6 F (36.4 C) 97.8 F (36.6 C) 97.7 F (36.5 C)  TempSrc:  Oral    SpO2: 100% 96% 93% 98%  Weight:  Height:        Intake/Output Summary (Last 24 hours) at 05/01/2023 0837 Last data filed at 05/01/2023 0800 Gross per 24 hour  Intake 760 ml  Output 900 ml  Net -140 ml   Filed Weights   04/18/23 1755  Weight: 83.9 kg    Examination:  General exam: Appears uncomfortable  Respiratory system: diminished breath sounds b/l  Cardiovascular system: S1/S2+. No rubs or clicks Gastrointestinal system: abd is soft, NT, ND & hypoactive bowel  sounds Central nervous system: alert & oriented. Moves all extremities  Psychiatry: Judgement and insight appears at baseline. Flat mood and affect    Data Reviewed: I have personally reviewed following labs and imaging studies  CBC: Recent Labs  Lab 04/25/23 0429 04/27/23 0511 04/28/23 0544 04/29/23 0521 04/30/23 0531  WBC 11.9* 9.6 10.7* 7.9 7.7  NEUTROABS 8.5*  --   --  5.0 5.3  HGB 8.3* 7.5* 7.5* 7.2* 7.2*  HCT 25.6* 22.4* 23.1* 21.9* 22.8*  MCV 85.9 86.2 84.6 85.9 88.0  PLT 468* 454* 453* 456* 486*   Basic Metabolic Panel: Recent Labs  Lab 04/25/23 0429 04/27/23 0511 04/28/23 0544 04/29/23 0521 04/30/23 0531  NA 134* 135 134* 131* 134*  K 4.1 4.2 4.9 4.7 5.0  CL 105 109 105 102 103  CO2 18* 22 21* 19* 22  GLUCOSE 106* 57* 123* 131* 181*  BUN 26* 23 28* 28* 25*  CREATININE 0.83 0.75 0.90 0.81 0.82  CALCIUM  8.2* 8.1* 8.2* 8.4* 8.6*   GFR: Estimated Creatinine Clearance: 77.1 mL/min (by C-G formula based on SCr of 0.82 mg/dL). Liver Function Tests: Recent Labs  Lab 04/30/23 0531  AST 32  ALT 54*  ALKPHOS 93  BILITOT 0.3  PROT 6.3*  ALBUMIN 2.2*   No results for input(s): LIPASE, AMYLASE in the last 168 hours. No results for input(s): AMMONIA in the last 168 hours. Coagulation Profile: No results for input(s): INR, PROTIME in the last 168 hours. Cardiac Enzymes: No results for input(s): CKTOTAL, CKMB, CKMBINDEX, TROPONINI in the last 168 hours. BNP (last 3 results) No results for input(s): PROBNP in the last 8760 hours. HbA1C: No results for input(s): HGBA1C in the last 72 hours. CBG: Recent Labs  Lab 04/30/23 0824 04/30/23 1159 04/30/23 1708 04/30/23 2123 05/01/23 0718  GLUCAP 138* 215* 161* 194* 164*   Lipid Profile: No results for input(s): CHOL, HDL, LDLCALC, TRIG, CHOLHDL, LDLDIRECT in the last 72 hours. Thyroid Function Tests: No results for input(s): TSH, T4TOTAL, FREET4, T3FREE, THYROIDAB  in the last 72 hours. Anemia Panel: No results for input(s): VITAMINB12, FOLATE, FERRITIN, TIBC, IRON , RETICCTPCT in the last 72 hours. Sepsis Labs: No results for input(s): PROCALCITON, LATICACIDVEN in the last 168 hours.  Recent Results (from the past 240 hours)  Aerobic/Anaerobic Culture w Gram Stain (surgical/deep wound)     Status: None   Collection Time: 04/21/23  9:49 AM   Specimen: Path fluid; Body Fluid  Result Value Ref Range Status   Specimen Description   Final    FLUID Performed at [facility], [address] [place], [place] [address]    Special Requests SWAB OF RIGHT KNEE SYNOVIAL FLUID  Final   Gram Stain   Final    FEW WBC PRESENT, PREDOMINANTLY PMN NO ORGANISMS SEEN    Culture   Final    No growth aerobically or anaerobically. Performed at [facility], [address], , [place] [address]    Report Status 04/30/2023 FINAL  Final         Radiology Studies: No results found.      Scheduled Meds:  aspirin  EC  81 mg Oral Daily   atorvastatin   80 mg Oral QHS   carvedilol   25 mg Oral BID WC   clopidogrel   75 mg Oral Daily   dapagliflozin  propanediol  10 mg Oral Daily   DULoxetine   20 mg Oral BID   enoxaparin  (LOVENOX ) injection  40 mg Subcutaneous Q24H   folic acid   1 mg Oral Daily   insulin  aspart  0-20 Units Subcutaneous TID WC   insulin  aspart  0-5 Units Subcutaneous QHS   insulin  glargine-yfgn  17 Units Subcutaneous Daily   iron  polysaccharides  150 mg Oral Daily   multivitamin with minerals  1 tablet Oral Daily   nystatin   5 mL Oral QID   polyethylene glycol  17 g Oral Daily   pregabalin   75 mg Oral BID   Ensure Max Protein  11 oz Oral Daily   sacubitril -valsartan   1 tablet Oral Q12H   spironolactone   50 mg Oral Daily   tamsulosin   0.4 mg Oral QPC supper   thiamine   100 mg Oral Daily   traZODone   100 mg Oral QHS   Continuous Infusions:  cefTRIAXone  (ROCEPHIN )  IV 2 g (04/30/23 1315)   vancomycin   2,000 mg (05/01/23 0531)     LOS: 12 days      Anthony CHRISTELLA Pouch, MD [facility] Pager [phone]  If 7PM-7AM, please contact night-coverage [email or web address] 05/01/2023, 8:37 AM

## 2023-05-01 NOTE — Plan of Care (Signed)
  Problem: Respiratory: Goal: Ability to maintain adequate ventilation will improve Outcome: Progressing   Problem: Education: Goal: Ability to describe self-care measures that may prevent or decrease complications (Diabetes Survival Skills Education) will improve Outcome: Progressing   Problem: Coping: Goal: Ability to adjust to condition or change in health will improve Outcome: Progressing

## 2023-05-02 DIAGNOSIS — M00861 Arthritis due to other bacteria, right knee: Secondary | ICD-10-CM | POA: Diagnosis not present

## 2023-05-02 DIAGNOSIS — N179 Acute kidney failure, unspecified: Secondary | ICD-10-CM | POA: Diagnosis not present

## 2023-05-02 DIAGNOSIS — E871 Hypo-osmolality and hyponatremia: Secondary | ICD-10-CM | POA: Diagnosis not present

## 2023-05-02 DIAGNOSIS — Z515 Encounter for palliative care: Secondary | ICD-10-CM | POA: Diagnosis not present

## 2023-05-02 DIAGNOSIS — M8600 Acute hematogenous osteomyelitis, unspecified site: Secondary | ICD-10-CM | POA: Diagnosis not present

## 2023-05-02 LAB — BASIC METABOLIC PANEL
Anion gap: 7 (ref 5–15)
BUN: 18 mg/dL (ref 8–23)
CO2: 22 mmol/L (ref 22–32)
Calcium: 8.5 mg/dL — ABNORMAL LOW (ref 8.9–10.3)
Chloride: 104 mmol/L (ref 98–111)
Creatinine, Ser: 0.76 mg/dL (ref 0.61–1.24)
GFR, Estimated: >60 mL/min (ref >60)
Glucose, Bld: 154 mg/dL — ABNORMAL HIGH (ref 70–99)
Potassium: 5.1 mmol/L (ref 3.5–5.1)
Sodium: 133 mmol/L — ABNORMAL LOW (ref 135–145)

## 2023-05-02 LAB — GLUCOSE, CAPILLARY
Glucose-Capillary: 142 mg/dL — ABNORMAL HIGH (ref 70–99)
Glucose-Capillary: 136 mg/dL — ABNORMAL HIGH (ref 70–99)
Glucose-Capillary: 171 mg/dL — ABNORMAL HIGH (ref 70–99)
Glucose-Capillary: 196 mg/dL — ABNORMAL HIGH (ref 70–99)

## 2023-05-02 LAB — CBC
HCT: 22.1 % — ABNORMAL LOW (ref 39.0–52.0)
Hemoglobin: 7 g/dL — ABNORMAL LOW (ref 13.0–17.0)
MCH: 27 pg (ref 26.0–34.0)
MCHC: 31.7 g/dL (ref 30.0–36.0)
MCV: 85.3 fL (ref 80.0–100.0)
Platelets: 508 10*3/uL — ABNORMAL HIGH (ref 150–400)
RBC: 2.59 MIL/uL — ABNORMAL LOW (ref 4.22–5.81)
RDW: 15.8 % — ABNORMAL HIGH (ref 11.5–15.5)
WBC: 7.5 10*3/uL (ref 4.0–10.5)
nRBC: 0 % (ref 0.0–0.2)

## 2023-05-02 LAB — VANCOMYCIN, TROUGH: Vancomycin Tr: 21 ug/mL (ref 15–20)

## 2023-05-02 MED ORDER — SPIRONOLACTONE 25 MG PO TABS: 50.0000 mg | ORAL_TABLET | Freq: Every day | ORAL | Status: DC

## 2023-05-02 MED ORDER — VANCOMYCIN HCL 1500 MG/300ML IV SOLN
1500.0000 mg | INTRAVENOUS | Status: AC
  Administered 2023-05-03 – 2023-05-06 (×4): 1500 mg via INTRAVENOUS
  Filled 2023-05-02 (×4): qty 300

## 2023-05-02 NOTE — Plan of Care (Signed)
  Problem: Fluid Volume: Goal: Hemodynamic stability will improve Outcome: Progressing   Problem: Clinical Measurements: Goal: Signs and symptoms of infection will decrease Outcome: Progressing   Problem: Respiratory: Goal: Ability to maintain adequate ventilation will improve Outcome: Progressing   Problem: Coping: Goal: Ability to adjust to condition or change in health will improve Outcome: Progressing

## 2023-05-02 NOTE — Progress Notes (Signed)
 Physical Therapy Treatment Patient Details Name: Manuel [name] MRN: [id number] DOB: 1945/09/30 Today's Date: 05/02/2023   History of Present Illness Pt is a 78 y.o. male presenting to hospital 04/19/23 with c/o AMS, weakness, fall, and R leg swelling/pain. Recently seen in ED for fall onto R knee causing effusion and pain; pt s/p R foot TMA 2 months prior. Pt admitted with sepsis d/t cellulitis, AKI superimposed on CKD, hyponatremia, acute metabolic encephalopathy. 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 S/p R knee aspiration 04/20/23. S/p R knee joint I&D 04/21/23 d/t R septic knee. 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    PT Comments  Pt was sitting in recliner upon arrival. RN + RN tech/sitter present. Vitals checked prior to session with all readings stable. Pt is eagerly requesting to return to bed after sitting in recliner x ~ 1.5 hours. Attempted to stand to RW however pt unable. Author questions pt's effort. He easily stood 1 x with author protecting L knee from buckling + BUE support by chartered loss adjuster. Once in standing, pt able to pivot on LLE easily but unable to advance to hopping. Acute PT will continue to follow and progress per current POC. DC recs remain appropriate to maximize his independence and safety with all ADLs.    If plan is discharge home, recommend the following: Assistance with cooking/housework;Direct supervision/assist for medications management;Direct supervision/assist for financial management;Assist for transportation;Help with stairs or ramp for entrance;Supervision due to cognitive status;Two people to help with walking and/or transfers;Two people to help with bathing/dressing/bathroom     Equipment Recommendations  Other (comment) (Defer to next level of care)       Precautions / Restrictions Precautions Precautions: Fall Restrictions Weight Bearing Restrictions Per  Provider Order: No     Mobility  Bed Mobility Overal bed mobility: Needs Assistance Bed Mobility: Sit to Supine  Supine to sit: Supervision  General bed mobility comments: pt was able to return to supine from EOB without physical assistance    Transfers Overall transfer level: Needs assistance Equipment used: Rolling walker (2 wheels) Transfers: Sit to/from Stand, Bed to chair/wheelchair/BSC Sit to Stand: Max assist Stand pivot transfers: Max assist  General transfer comment: pt attempted to stand from recliner to RW however unable. Performed stand pivot with author blocking LLE(knee) and perform standing with max assist of one. Once standing, pt easily able to pivot LLE to EOB. Pt does get a little anxious but overall tolerates well. Pt endorses being very fatigued from sitting OOB x only ~ 1.5 hors    Ambulation/Gait  General Gait Details: currently unable    Balance Overall balance assessment: Needs assistance Sitting-balance support: No upper extremity supported, Feet supported Sitting balance-Leahy Scale: Fair     Standing balance support: Bilateral upper extremity supported, During functional activity, Reliant on assistive device for balance Standing balance-Leahy Scale: Poor Standing balance comment: pt remains high fall risk      Cognition Arousal: Alert Behavior During Therapy: WFL for tasks assessed/performed Overall Cognitive Status: Within Functional Limits for tasks assessed    General Comments: Pt is A and O x 3. Eager to return to bed.           General Comments General comments (skin integrity, edema, etc.): Pt's supportive family member present. reviewed importance of positioning, extension exrcises/stretching, and importance of routine OOB activity. Overall pt tolerated session well. STR still most appropriate to maximize his indpendnece and safety with  all ADLs      Pertinent Vitals/Pain Pain Assessment Pain Assessment: 0-10 Pain Score: 6  Pain  Descriptors / Indicators: Sore Pain Intervention(s): Limited activity within patient's tolerance, Monitored during session, Premedicated before session, Repositioned, Ice applied     PT Goals (current goals can now be found in the care plan section) Progress towards PT goals: Progressing toward goals    Frequency    Min 1X/week       Co-evaluation     PT goals addressed during session: Mobility/safety with mobility;Balance;Proper use of DME;Strengthening/ROM        AM-PAC PT 6 Clicks Mobility   Outcome Measure  Help needed turning from your back to your side while in a flat bed without using bedrails?: A Little Help needed moving from lying on your back to sitting on the side of a flat bed without using bedrails?: A Little Help needed moving to and from a bed to a chair (including a wheelchair)?: A Lot Help needed standing up from a chair using your arms (e.g., wheelchair or bedside chair)?: A Lot Help needed to walk in hospital room?: Total Help needed climbing 3-5 steps with a railing? : Total 6 Click Score: 12    End of Session Equipment Utilized During Treatment: Gait belt Activity Tolerance: Patient tolerated treatment well Patient left: with call bell/phone within reach;with chair alarm set;in bed;with nursing/sitter in room Nurse Communication: Mobility status;Precautions;Other (comment) PT Visit Diagnosis: Other abnormalities of gait and mobility (R26.89);Muscle weakness (generalized) (M62.81);History of falling (Z91.81);Pain Pain - Right/Left: Right Pain - part of body: Knee;Leg     Time: 8397-8386 PT Time Calculation (min) (ACUTE ONLY): 11 min  Charges:    $Therapeutic Activity: 8-22 mins PT General Charges $$ ACUTE PT VISIT: 1 Visit                     Rankin Essex PTA 05/02/23, 4:33 PM

## 2023-05-02 NOTE — Progress Notes (Signed)
 Pharmacy Antibiotic Note  Manuel [name] is a 78 y.o. male w/ PMH of OA, CAD, HTN, DDD, DM, gout, HLD, OSA, peripheral neuropathy admitted on 04/18/2023 with cellulitis.  Pharmacy has been consulted for vancomycin  dosing. Patient with infection at previous TMA site s/p BKA on 12/29 then aspiration of knee revealed elevated WBCs and had washout of knee on 12/30  Today, 05/02/2023 Day #14 vancomycin  and ceftriaxone   Renal: SCr 0.76 (stable) WBC 7.5 Afebrile 12/28 blood culture: NG-Final 12/28 R foot culture: MSSA 12/29 synovial fluid knee: NG 12/30 Swab knee synovial fluid: NG  Vancomycin  levels:  On vancomycin  2gm IV q24h (previous dose given 1/6 at 06:03) Vanco peak 1/6 at 08:33 = 20 mcg/ml  Vanco trough 1/7 at 05:21 = 13 mcg/mL Vancomycin  peak drawn during distribution phase - should have been drawn 1h after end of infusion, not .  Can argue that the peak likely higher than measured.   Using data above (again, peak not drawn at appropriate time), AUC 431.5, Cmax 24.4 mcg/ml, Cmin 12.7 mcg/mL Vancomycin  2gm IV q24h (previous dose given 1/9 at 05:31) Vancomycin  trough 1/10 at 0524 = 21 mcg.mL  Plan:  Based on 1/10 trough level of 21 mcg/ml (supratherapeutic), adjust Vancomycin  to 1500 mg IV every 24 hours, new estimated trough 15.7 mcg/mL  Pharmacy will continue to follow and will adjust abx dosing as warranted. F/u renal function - recheck in am May not need to recheck vancomycin  levels with anticipated end date of 1/14 (unless change in renal function) ID following - planning two weeks of IV therapy to 1/14 then change to PO   Temp (24hrs), Avg:97.7 F (36.5 C), Min:97.4 F (36.3 C), Max:98.2 F (36.8 C)   Recent Labs  Lab 04/27/23 0511 04/28/23 0544 04/28/23 0833 04/29/23 0521 04/30/23 0531 05/02/23 0524  WBC 9.6 10.7*  --  7.9 7.7 7.5  CREATININE 0.75 0.90  --  0.81 0.82 0.76  VANCOTROUGH  --   --   --  13*  --  21*  VANCOPEAK  --   --  20*  --   --   --      Estimated Creatinine Clearance: 79 mL/min (by C-G formula based on SCr of 0.76 mg/dL).    No Known Allergies  Antimicrobials this admission: 12/28 cefepime  >> x 1 dose 12/28 vancomycin  >>  12/28 ceftriaxone  >>   Microbiology results: 12/28 BCx: NGTD 12/28 WCx: moderate MSSA MRSA PCR negative  Thank you for involving pharmacy in this patient's care.   Kristle Wesch, PharmD, BCPS, BCIDP Work Cell: [phone] 05/02/2023 8:34 AM

## 2023-05-02 NOTE — Plan of Care (Signed)
  Problem: Coping: Goal: Ability to adjust to condition or change in health will improve Outcome: Progressing   Problem: Health Behavior/Discharge Planning: Goal: Ability to manage health-related needs will improve Outcome: Progressing   Problem: Metabolic: Goal: Ability to maintain appropriate glucose levels will improve Outcome: Progressing   Problem: Education: Goal: Knowledge of General Education information will improve Description: Including pain rating scale, medication(s)/side effects and non-pharmacologic comfort measures Outcome: Progressing   Problem: Coping: Goal: Level of anxiety will decrease Outcome: Progressing   Problem: Pain Management: Goal: General experience of comfort will improve Outcome: Progressing

## 2023-05-02 NOTE — Progress Notes (Addendum)
 Progress Note    05/02/2023 8:37 AM 11 Days Post-Op  Subjective:  Manuel [name] is a 78 yo male who is now POD #11 from right below the knee amputation.  Patient is resting comfortably in bed and sleeping this morning when I arrived.  Dressing is in place clean dry and intact.  Nursing states that they had just changed dressing.  No complications today.  Patient remains with a sitter due to suicidal ideation.  Patient continues on IV antibiotics as scheduled.  Vitals are remained stable.   Vitals:   05/01/23 2223 05/02/23 0822  BP: 127/67 123/61  Pulse: 67 66  Resp: 17 18  Temp: (!) 97.5 F (36.4 C) 98.2 F (36.8 C)  SpO2: 96% 98%   Physical Exam: Cardiac:  RRR, Normal S1, S2. No rubs, clicks or gallup's. No Murmurs.  Lungs:  Clear on auscultation throughout, no rales rhonchi or wheezing.  Incisions:  Right BKA incision clean and dry. Staples intact. No infection to note.  Extremities:  Left lower extremity is warm to touch with palpable pulses.  Abdomen:  Positive bowel sounds throughout, soft, non tender and non distended.  Neurologic: AAOX3  CBC    Component Value Date/Time   WBC 7.5 05/02/2023 0524   RBC 2.59 (L) 05/02/2023 0524   HGB 7.0 (L) 05/02/2023 0524   HGB 15.7 02/16/2012 2013   HCT 22.1 (L) 05/02/2023 0524   HCT 44.4 02/16/2012 2013   PLT 508 (H) 05/02/2023 0524   PLT 176 02/16/2012 2013   MCV 85.3 05/02/2023 0524   MCV 91 02/16/2012 2013   MCH 27.0 05/02/2023 0524   MCHC 31.7 05/02/2023 0524   RDW 15.8 (H) 05/02/2023 0524   RDW 13.0 02/16/2012 2013   LYMPHSABS 1.5 04/30/2023 0531   MONOABS 0.8 04/30/2023 0531   EOSABS 0.1 04/30/2023 0531   BASOSABS 0.0 04/30/2023 0531    BMET    Component Value Date/Time   NA 133 (L) 05/02/2023 0524   NA 137 02/16/2012 2013   K 5.1 05/02/2023 0524   K 3.8 02/16/2012 2013   CL 104 05/02/2023 0524   CL 102 02/16/2012 2013   CO2 22 05/02/2023 0524   CO2 25 02/16/2012 2013   GLUCOSE 154 (H) 05/02/2023 0524    GLUCOSE 154 (H) 02/16/2012 2013   BUN 18 05/02/2023 0524   BUN 10 02/16/2012 2013   CREATININE 0.76 05/02/2023 0524   CREATININE 0.72 02/16/2012 2013   CALCIUM  8.5 (L) 05/02/2023 0524   CALCIUM  9.6 02/16/2012 2013   GFRNONAA >60 05/02/2023 0524   GFRNONAA >60 02/16/2012 2013   GFRAA >60 12/09/2018 0929   GFRAA >60 02/16/2012 2013    INR    Component Value Date/Time   INR 1.4 (H) 04/19/2023 0442     Intake/Output Summary (Last 24 hours) at 05/02/2023 0837 Last data filed at 05/02/2023 0352 Gross per 24 hour  Intake 1080 ml  Output 1500 ml  Net -420 ml     Assessment/Plan:  78 y.o. male is s/p Right Below the knee amputation  11 Days Post-Op   PLAN Continue antibiotics as ordered. PT OT OOB to bedside chair during the day. Continue with sitter as ordered by psych. Right BKA staple removal in 10 days if patient still. If patient is dispo to facility or home patient will need to return to [facility] surgery for follow-up and staple removal in 10 days.  DVT prophylaxis: Aspirin  81 mg daily, Plavix  75 mg daily.   [name] [facility] 05/02/2023 8:37 AM

## 2023-05-02 NOTE — Progress Notes (Signed)
 PT Cancellation Note  Patient Details Name: Manuel [name] MRN: [id number] DOB: 18-Oct-1945   Cancelled Treatment:     PT attempt 2 x this date. First attempt, pt sleeping soundly. Sitter still in place. 2nd attempt pt was discussing POC with palliative. Acute PT will continue to follow and progress per current POC.    [name] 05/02/2023, 11:51 AM

## 2023-05-02 NOTE — Progress Notes (Signed)
 Date of Admission:  04/18/2023  BRNADON [name] is a 78 y.o. 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, followed by TMA in oct 2024 presented with infected TMA site Pt has complicated PAD history and foot infection    he had endarterectomies of both rt and left common, profunda and superficial femoral artery, fogary embolectomy of rt SFA, stent placement in b/l common iliac arteries, left external iliac and Rt PTA, on 11/15/22  Had wound dehiscence on the rt groin  , he underwent debridement of the wound on the rt groin on 01/09/23 with excision of 15 cm2 of skin, soft tissue, muscle fascia and application of neg pressure vac He had serratia , staph aureus and ecoli in culture and received Iv antibiotic followed by PO cipro + Augmentin  He also underwent TMA on 02/17/23 He followed with Podiatrist as OP On 12/24 he came to the ED with rt knee and leg pain after he fell out of bed. The TMA site was red , weeping and and he was started on keflex  and bactrim  and discharged from ED. Xray was done in the ED on 12/14 and it did not show any fracture.   He returned to the ED with rt leg swelling upto thigh , pain and confusion   ID: Manuel [name] is a 78 y.o. male  Principal Problem:   Osteomyelitis (HCC) Active Problems:   Hypertension   Coronary artery disease with history of myocardial infarction without history of CABG   Diabetes mellitus type 2, insulin  dependent (HCC)   Mixed hyperlipidemia   Lower limb ischemia right leg with history of angioplasty and stent on 05/14/2021   Chronic combined systolic and diastolic CHF (congestive heart failure) (HCC)   Acute kidney injury superimposed on chronic kidney disease (HCC)   Hyponatremia   Uncontrolled type 2 diabetes mellitus with hyperglycemia, with long-term current use of insulin  (HCC)   Essential hypertension   Dyslipidemia   BPH (benign prostatic hyperplasia)   Acute  metabolic encephalopathy   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)   Diabetic infection of right foot (HCC)   Wound infection   Current severe episode of major depressive disorder without psychotic features (HCC)   Subjective: doing well Says he worked with PT Sitting in chair  Medications:   aspirin  EC  81 mg Oral Daily   atorvastatin   80 mg Oral QHS   carvedilol   25 mg Oral BID WC   clopidogrel   75 mg Oral Daily   dapagliflozin  propanediol  10 mg Oral Daily   DULoxetine   20 mg Oral BID   enoxaparin  (LOVENOX ) injection  40 mg Subcutaneous Q24H   folic acid   1 mg Oral Daily   HYDROmorphone   2 mg Oral Q6H   insulin  aspart  0-20 Units Subcutaneous TID WC   insulin  aspart  0-5 Units Subcutaneous QHS   insulin  glargine-yfgn  17 Units Subcutaneous Daily   iron  polysaccharides  150 mg Oral Daily   methocarbamol   500 mg Oral TID   multivitamin with minerals  1 tablet Oral Daily   nystatin   5 mL Oral QID   polyethylene glycol  17 g Oral Daily   pregabalin   75 mg Oral BID   Ensure Max Protein  11 oz Oral Daily   sacubitril -valsartan   1 tablet Oral Q12H   [START ON 05/03/2023] spironolactone   50 mg Oral Daily   tamsulosin   0.4 mg  Oral QPC supper   thiamine   100 mg Oral Daily   traZODone   100 mg Oral QHS    Objective: Vital signs in last 24 hours: Patient Vitals for the past 24 hrs:  BP Temp Temp src Pulse Resp SpO2  05/02/23 0822 123/61 98.2 F (36.8 C) Oral 66 18 98 %  05/01/23 2223 127/67 (!) 97.5 F (36.4 C) -- 67 17 96 %  05/01/23 1612 (!) 148/70 (!) 97.4 F (36.3 C) -- 69 18 100 %      PHYSICAL EXAM:  General:alert, happy Lungs:b/l air entry Heart: s1s2 Extremities: rt BKA- Rt knee mild edema- superficial abrasion  Skin: No rashes or lesions. Or bruising Neurologic: Grossly non-focal  Lab Results    Latest Ref Rng & Units 05/02/2023    5:24 AM 04/30/2023    5:31 AM 04/29/2023    5:21 AM  CBC  WBC 4.0 - 10.5 K/uL 7.5  7.7  7.9   Hemoglobin 13.0 - 17.0  g/dL 7.0  7.2  7.2   Hematocrit 39.0 - 52.0 % 22.1  22.8  21.9   Platelets 150 - 400 K/uL 508  486  456        Latest Ref Rng & Units 05/02/2023    5:24 AM 04/30/2023    5:31 AM 04/29/2023    5:21 AM  CMP  Glucose 70 - 99 mg/dL 845  818  868   BUN 8 - 23 mg/dL 18  25  28    Creatinine 0.61 - 1.24 mg/dL 9.23  9.17  9.18   Sodium 135 - 145 mmol/L 133  134  131   Potassium 3.5 - 5.1 mmol/L 5.1  5.0  4.7   Chloride 98 - 111 mmol/L 104  103  102   CO2 22 - 32 mmol/L 22  22  19    Calcium  8.9 - 10.3 mg/dL 8.5  8.6  8.4   Total Protein 6.5 - 8.1 g/dL  6.3    Total Bilirubin 0.0 - 1.2 mg/dL  0.3    Alkaline Phos 38 - 126 U/L  93    AST 15 - 41 U/L  32    ALT 0 - 44 U/L  54        Microbiology: Synovial fluid- no organisms seen  Culture neg so far    Assessment/Plan:  ?Rt TMA site infection- underwent BKA Wound culture was MSSA   Rt knee septic arthritis- s/p I/D   On ceftriaxone  and vanco- - culture no growth - continue both antibiotics by IV for a minimum of 2 weeks ( 05/06/23)  and then can switch to PO cefudroxil 500mg  BID  + Doxy 100mg  BID X 2 weeks until 05/20/23   DM- management as per primary team   PAD-- h/o endarterectomies of both rt and left common, profunda and superficial femoral artery, fogary embolectomy of rt SFA, stent placement in b/l common iliac arteries, left external iliac and Rt PTA, on 11/15/22    AKI- resolved   Anemia   Intermittent and chronic  left sided abdominal pain-management as per hospitalist In 2023 Ct  showed colonic diverticulosis, saccular aneursym of 2.3 cm and superior end plate fracture of l1  Discussed the management with  patient and wife  ID will not see him this weekend Call if needed

## 2023-05-02 NOTE — Progress Notes (Signed)
 Occupational Therapy Treatment Patient Details Name: Manuel [name] MRN: [id number] DOB: [birth year] Today's Date: 05/02/2023   History of present illness Pt is a 78 y.o. male presenting to hospital 04/19/23 with c/o AMS, weakness, fall, and R leg swelling/pain. Recently seen in ED for fall onto R knee causing effusion and pain; pt s/p R foot TMA 2 months prior. Pt admitted with sepsis d/t cellulitis, AKI superimposed on CKD, hyponatremia, acute metabolic encephalopathy. 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 S/p R knee aspiration 04/20/23. S/p R knee joint I&D 04/21/23 d/t R septic knee. 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   OT comments  [name] was seen for OT treatment on this date. Upon arrival to room pt asleep, wakes to voice and agreeable to tx. Pt requires MIN A exit bed, good sitting balance. MOD A x2 + RW sit<>stand from bed and bed>chair pivot t/f. Performed x3 sit<>stands at chair with +2 assist for safety and MOD A. Educated on falls prevention strategies and pain mgmt. Pt making good progress toward goals, will continue to follow POC. Discharge recommendation updated.       If plan is discharge home, recommend the following:  A lot of help with walking and/or transfers;A lot of help with bathing/dressing/bathroom;Assistance with cooking/housework;Assist for transportation;Help with stairs or ramp for entrance;Direct supervision/assist for medications management;Direct supervision/assist for financial management;Supervision due to cognitive status   Equipment Recommendations  BSC/3in1    Recommendations for Other Services      Precautions / Restrictions Precautions Precautions: Fall Restrictions Weight Bearing Restrictions Per Provider Order: No       Mobility Bed Mobility Overal bed mobility: Needs Assistance Bed Mobility: Supine to Sit Rolling: Min assist               Transfers Overall transfer level: Needs assistance Equipment used: Rolling walker (2 wheels) Transfers: Sit to/from Stand, Bed to chair/wheelchair/BSC Sit to Stand: Mod assist, +2 physical assistance Stand pivot transfers: Mod assist, +2 physical assistance               Balance Overall balance assessment: Needs assistance Sitting-balance support: No upper extremity supported, Feet supported Sitting balance-Leahy Scale: Good     Standing balance support: Bilateral upper extremity supported, Reliant on assistive device for balance Standing balance-Leahy Scale: Poor                             ADL either performed or assessed with clinical judgement   ADL Overall ADL's : Needs assistance/impaired                                       General ADL Comments: MAX A for LB access in sitting      Cognition Arousal: Alert Behavior During Therapy: WFL for tasks assessed/performed Overall Cognitive Status: Within Functional Limits for tasks assessed                                                     Pertinent Vitals/ Pain       Pain Assessment Pain Assessment: Faces Faces Pain Scale: Hurts even more Pain Location: distal stump Pain  Descriptors / Indicators: Sore Pain Intervention(s): Limited activity within patient's tolerance, Premedicated before session, Repositioned   Frequency  Min 1X/week        Progress Toward Goals  OT Goals(current goals can now be found in the care plan section)  Progress towards OT goals: Progressing toward goals  Acute Rehab OT Goals Patient Stated Goal: to go to rehab OT Goal Formulation: With patient/family Time For Goal Achievement: 05/07/23 Potential to Achieve Goals: Good ADL Goals Pt Will Perform Lower Body Dressing: with modified independence;sitting/lateral leans Pt Will Transfer to Toilet: with supervision;bedside commode;ambulating Pt Will Perform Toileting -  Clothing Manipulation and hygiene: sitting/lateral leans;with modified independence Additional ADL Goal #1: Pt will demonstrate independence with desensitization strategies to support residual limb recovery. Additional ADL Goal #2: Pt will complete all aspects of a seated bath with set up and supv for safety.  Plan      Co-evaluation                 AM-PAC OT 6 Clicks Daily Activity     Outcome Measure   Help from another person eating meals?: None Help from another person taking care of personal grooming?: A Little Help from another person toileting, which includes using toliet, bedpan, or urinal?: A Lot Help from another person bathing (including washing, rinsing, drying)?: A Lot Help from another person to put on and taking off regular upper body clothing?: A Little Help from another person to put on and taking off regular lower body clothing?: A Lot 6 Click Score: 16    End of Session Equipment Utilized During Treatment: Gait belt;Rolling walker (2 wheels)  OT Visit Diagnosis: Other abnormalities of gait and mobility (R26.89);Pain   Activity Tolerance Patient tolerated treatment well   Patient Left in chair;with call bell/phone within reach;with nursing/sitter in room;with family/visitor present   Nurse Communication Patient requests pain meds        Time: 8585-8565 OT Time Calculation (min): 20 min  Charges: OT General Charges $OT Visit: 1 Visit OT Treatments $Therapeutic Activity: 8-22 mins  Elston Slot, M.S. OTR/L  05/02/23, 3:09 PM  ascom [phone]

## 2023-05-02 NOTE — Progress Notes (Signed)
 PROGRESS NOTE    Manuel [name]  FMW:[id number] DOB: 1945-08-03 DOA: 04/18/2023 PCP: Rudolpho Norleen BIRCH, MD    Assessment & Plan:   Principal Problem:   Osteomyelitis [facility]) Active Problems:   Acute kidney injury superimposed on chronic kidney disease (HCC)   Hyponatremia   Hypertension   Acute metabolic encephalopathy   Diabetes mellitus type 2, insulin  dependent (HCC)   Uncontrolled type 2 diabetes mellitus with hyperglycemia, with long-term current use of insulin  (HCC)   Mixed hyperlipidemia   Chronic combined systolic and diastolic CHF (congestive heart failure) (HCC)   Coronary artery disease with history of myocardial infarction without history of CABG   Lower limb ischemia right leg with history of angioplasty and stent on 05/14/2021   Essential hypertension   Dyslipidemia   BPH (benign prostatic hyperplasia)   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 Continue on IV rocephin  until 05/06/23 then switch to po cefudroxil and doxy x 2 weeks until 05/20/23 as per ID    Right septic knee: with gouty arthritis. S/p R knee I&D on 12/30. Cx growing MSSA. Holding colchicine . Continue on IV rocephin  as per ID    Suicidal ideation: continue w/ sitter. Needs inpatient psych as per psych. Hx of suicidal ideation in the past    Orthostasis with possible arrest: with precipitous BP drop with ambulation to the chair and then called arrest upon return to bed. S/p did receive about 5 minutes of CPR (nursing reports that his wife requested this despite DNR status) despite DNR status. No further CPR is appropriate - discussed with patient and his wife   Acute blood loss anemia: H&H are trending down. Pt wants to hold off getting a  transfusion today. Repeat H&H in AM     Intermittent L side pain: chronic. Etiology unclear. Will continue to monitor. Wife reports that this is a chronic issue but seems to worsen after surgeries  Hyponatremia: chronic. Labile     Chronic systolic heart failure with a last EF of 30% in October 202. Hx of ischemic cardiomyopathy.  Continue on entresto , aldactone , coreg , farxiga . Holding torsemide    Acute metabolic encephalopathy: likely multifactorial in the setting of hypovolemia and infectious etiology. Re-orient prn    DM2: poorly controlled. Continue on glargine, SSI w/ accuchecks     BPH: continue on tamsulosin      HLD: continue on statin    PVD: continue on statin, aspirin , plavix     HTN: continue on aldactone , entresto , coreg . Hold torsemide    Chronic pain: continue on percocet. Morphine  prn       DVT prophylaxis: lovenox   Code Status: full  Family Communication: discussed pt's care w/ pt's family at bedside and answered their questions  Disposition Plan: d/c to psych unit vs SNF   Level of care: Med-Surg  Status is: Inpatient Remains inpatient appropriate because: severity of illness    Consultants:  Palliative care  ID Psych  Ortho surg   Procedures:   Antimicrobials: rocephin    Subjective: Pt c/o malaise   Objective: Vitals:   05/01/23 0716 05/01/23 1612 05/01/23 2223 05/02/23 0822  BP: 124/62 (!) 148/70 127/67 123/61  Pulse: (!) 59 69 67 66  Resp: 18 18 17 18   Temp: 97.7 F (36.5 C) (!)  97.4 F (36.3 C) (!) 97.5 F (36.4 C) 98.2 F (36.8 C)  TempSrc:    Oral  SpO2: 98% 100% 96% 98%  Weight:      Height:        Intake/Output Summary (Last 24 hours) at 05/02/2023 0825 Last data filed at 05/02/2023 0352 Gross per 24 hour  Intake 1080 ml  Output 1500 ml  Net -420 ml   Filed Weights   04/18/23 1755  Weight: 83.9 kg    Examination:  General exam: appears calm & comfortable  Respiratory system: decreased breath sounds b/l   Cardiovascular system: S1 & S2+. No rubs or clicks  Gastrointestinal system: abd is soft, NT, ND & hypoactive bowel sounds Central nervous system: alert & oriented. Moves all extremities  Psychiatry: Judgement and insight appears at baseline. Flat mood and affect     Data Reviewed: I have personally reviewed following labs and imaging studies  CBC: Recent Labs  Lab 04/27/23 0511 04/28/23 0544 04/29/23 0521 04/30/23 0531 05/02/23 0524  WBC 9.6 10.7* 7.9 7.7 7.5  NEUTROABS  --   --  5.0 5.3  --   HGB 7.5* 7.5* 7.2* 7.2* 7.0*  HCT 22.4* 23.1* 21.9* 22.8* 22.1*  MCV 86.2 84.6 85.9 88.0 85.3  PLT 454* 453* 456* 486* 508*   Basic Metabolic Panel: Recent Labs  Lab 04/27/23 0511 04/28/23 0544 04/29/23 0521 04/30/23 0531 05/02/23 0524  NA 135 134* 131* 134* 133*  K 4.2 4.9 4.7 5.0 5.1  CL 109 105 102 103 104  CO2 22 21* 19* 22 22  GLUCOSE 57* 123* 131* 181* 154*  BUN 23 28* 28* 25* 18  CREATININE 0.75 0.90 0.81 0.82 0.76  CALCIUM  8.1* 8.2* 8.4* 8.6* 8.5*   GFR: Estimated Creatinine Clearance: 79 mL/min (by C-G formula based on SCr of 0.76 mg/dL). Liver Function Tests: Recent Labs  Lab 04/30/23 0531  AST 32  ALT 54*  ALKPHOS 93  BILITOT 0.3  PROT 6.3*  ALBUMIN 2.2*   No results for input(s): LIPASE, AMYLASE in the last 168 hours. No results for input(s): AMMONIA in the last 168 hours. Coagulation Profile: No results for input(s): INR, PROTIME in the last 168 hours. Cardiac Enzymes: No results for input(s): CKTOTAL, CKMB, CKMBINDEX, TROPONINI in the last 168 hours. BNP (last 3 results) No results for input(s): PROBNP in the last 8760 hours. HbA1C: No results for input(s): HGBA1C in the last 72 hours. CBG: Recent Labs  Lab 04/30/23 2123 05/01/23 0718 05/01/23 1104 05/01/23 1650 05/01/23 2054  GLUCAP 194* 164* 194* 170* 178*   Lipid Profile: No results for input(s): CHOL, HDL, LDLCALC, TRIG, CHOLHDL, LDLDIRECT in the  last 72 hours. Thyroid Function Tests: No results for input(s): TSH, T4TOTAL, FREET4, T3FREE, THYROIDAB in the last 72 hours. Anemia Panel: No results for input(s): VITAMINB12, FOLATE, FERRITIN, TIBC, IRON , RETICCTPCT in the last 72 hours. Sepsis Labs: No results for input(s): PROCALCITON, LATICACIDVEN in the last 168 hours.  No results found for this or any previous visit (from the past 240 hours).        Radiology Studies: No results found.      Scheduled Meds:  aspirin  EC  81 mg Oral Daily   atorvastatin   80 mg Oral QHS   carvedilol   25 mg Oral BID WC   clopidogrel   75 mg Oral Daily   dapagliflozin  propanediol  10 mg Oral Daily   DULoxetine   20 mg Oral BID   enoxaparin  (LOVENOX ) injection  40 mg Subcutaneous  Q24H   folic acid   1 mg Oral Daily   HYDROmorphone   2 mg Oral Q6H   insulin  aspart  0-20 Units Subcutaneous TID WC   insulin  aspart  0-5 Units Subcutaneous QHS   insulin  glargine-yfgn  17 Units Subcutaneous Daily   iron  polysaccharides  150 mg Oral Daily   methocarbamol   500 mg Oral TID   multivitamin with minerals  1 tablet Oral Daily   nystatin   5 mL Oral QID   polyethylene glycol  17 g Oral Daily   pregabalin   75 mg Oral BID   Ensure Max Protein  11 oz Oral Daily   sacubitril -valsartan   1 tablet Oral Q12H   spironolactone   50 mg Oral Daily   tamsulosin   0.4 mg Oral QPC supper   thiamine   100 mg Oral Daily   traZODone   100 mg Oral QHS   Continuous Infusions:  cefTRIAXone  (ROCEPHIN )  IV Stopped (05/01/23 1527)   vancomycin  2,000 mg (05/02/23 0535)     LOS: 13 days      Anthony CHRISTELLA Pouch, MD [facility] Pager [phone]  If 7PM-7AM, please contact night-coverage [email or web address] 05/02/2023, 8:25 AM

## 2023-05-02 NOTE — Progress Notes (Signed)
 Daily Progress Note   Patient Name: THANE AGE       Date: 05/02/2023 DOB: 1945/06/09  Age: 78 y.o. MRN#: [id number] Attending Physician: Trudy Anthony HERO, MD Primary Care Physician: Rudolpho Norleen BIRCH, MD Admit Date: 04/18/2023  Reason for Consultation/Follow-up: Establishing goals of care  HPI/Brief Hospital Review: 78 y.o. 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  He then underwent right knee I&D for septic/gouty arthritis on 12/30.   Vascular surgery is recommending aspirin  daily with Plavix  and Lipitor .   ID is consulted for antibiotic recommendations.   PMT was consulted to discuss boundaries and goals of care.  Of note, Mr. Freese familiar to PMT services as he has been followed closely during previous hospitalizations.  Subjective: Extensive chart review has been completed prior to meeting patient including labs, vital signs, imaging, progress notes, orders, and available advanced directive documents from current and previous encounters.    Visited with Mr. Jessop at his bedside. He is awake, alert, sitting on side of bed, able to engage in conversations, mostly oriented but with moments of confusion/disorientation.  Mr. Broady speaks to the decline in his health since our last visit several months prior. He is able to share  his understanding of current medical situation. Safety sitter remains in place. Brother at bedside during time of visit.  Mr. Accardo shares is tired of feeling bad. He is aware that he needs to focus on therapy for a chance at recovery and is willing to participate.  Mr. Gallicchio shares his pain is well controlled and feels medication adjustments have helped with phantom pain. He denies acute complaints at this time. He is attempting to finish eating to have energy to participate with therapy.  Answered and addressed all questions and concerns. PMT to continue to follow for ongoing needs and support.  Thank you for allowing the Palliative Medicine Team to assist  in the care of this patient.  Total time:  35 minutes  Time spent includes: Detailed review of medical records (labs, imaging, vital signs), medically appropriate exam (mental status, respiratory, cardiac, skin), discussed with treatment team, counseling and educating patient, family and staff, documenting clinical information, medication management and coordination of care.  Waddell Lesches, DNP, AGNP-C Palliative Medicine   Please contact Palliative Medicine Team phone at [phone] for questions and concerns.

## 2023-05-03 DIAGNOSIS — N179 Acute kidney failure, unspecified: Secondary | ICD-10-CM | POA: Diagnosis not present

## 2023-05-03 DIAGNOSIS — M8600 Acute hematogenous osteomyelitis, unspecified site: Secondary | ICD-10-CM | POA: Diagnosis not present

## 2023-05-03 DIAGNOSIS — T148XXA Other injury of unspecified body region, initial encounter: Secondary | ICD-10-CM | POA: Diagnosis not present

## 2023-05-03 DIAGNOSIS — Z515 Encounter for palliative care: Secondary | ICD-10-CM | POA: Diagnosis not present

## 2023-05-03 LAB — CBC
HCT: 22.4 % — ABNORMAL LOW (ref 39.0–52.0)
Hemoglobin: 7.2 g/dL — ABNORMAL LOW (ref 13.0–17.0)
MCH: 27.4 pg (ref 26.0–34.0)
MCHC: 32.1 g/dL (ref 30.0–36.0)
MCV: 85.2 fL (ref 80.0–100.0)
Platelets: 519 10*3/uL — ABNORMAL HIGH (ref 150–400)
RBC: 2.63 MIL/uL — ABNORMAL LOW (ref 4.22–5.81)
RDW: 15.6 % — ABNORMAL HIGH (ref 11.5–15.5)
WBC: 7.5 10*3/uL (ref 4.0–10.5)
nRBC: 0 % (ref 0.0–0.2)

## 2023-05-03 LAB — PREPARE RBC (CROSSMATCH)

## 2023-05-03 LAB — TYPE AND SCREEN
ABO/RH(D): A POS
Antibody Screen: NEGATIVE
Unit division: 0

## 2023-05-03 LAB — GLUCOSE, CAPILLARY
Glucose-Capillary: 140 mg/dL — ABNORMAL HIGH (ref 70–99)
Glucose-Capillary: 168 mg/dL — ABNORMAL HIGH (ref 70–99)
Glucose-Capillary: 134 mg/dL — ABNORMAL HIGH (ref 70–99)
Glucose-Capillary: 181 mg/dL — ABNORMAL HIGH (ref 70–99)

## 2023-05-03 LAB — BPAM RBC
Blood Product Expiration Date: 202501192359
Unit Type and Rh: 600

## 2023-05-03 LAB — BASIC METABOLIC PANEL
Anion gap: 6 (ref 5–15)
BUN: 18 mg/dL (ref 8–23)
CO2: 23 mmol/L (ref 22–32)
Calcium: 8.4 mg/dL — ABNORMAL LOW (ref 8.9–10.3)
Chloride: 103 mmol/L (ref 98–111)
Creatinine, Ser: 0.78 mg/dL (ref 0.61–1.24)
GFR, Estimated: >60 mL/min (ref >60)
Glucose, Bld: 183 mg/dL — ABNORMAL HIGH (ref 70–99)
Potassium: 5 mmol/L (ref 3.5–5.1)
Sodium: 132 mmol/L — ABNORMAL LOW (ref 135–145)

## 2023-05-03 MED ORDER — SPIRONOLACTONE 25 MG PO TABS
50.0000 mg | ORAL_TABLET | Freq: Every day | ORAL | Status: DC
  Administered 2023-05-04 – 2023-05-06 (×3): 50 mg via ORAL
  Filled 2023-05-03 (×3): qty 2

## 2023-05-03 MED ORDER — PREGABALIN 50 MG PO CAPS
100.0000 mg | ORAL_CAPSULE | Freq: Two times a day (BID) | ORAL | Status: DC
  Administered 2023-05-03 – 2023-05-08 (×10): 100 mg via ORAL
  Filled 2023-05-03 (×10): qty 2

## 2023-05-03 NOTE — Plan of Care (Signed)
  Problem: Clinical Measurements: Goal: Diagnostic test results will improve Outcome: Progressing Goal: Signs and symptoms of infection will decrease Outcome: Progressing   Problem: Nutritional: Goal: Maintenance of adequate nutrition will improve Outcome: Progressing Goal: Progress toward achieving an optimal weight will improve Outcome: Progressing

## 2023-05-03 NOTE — Progress Notes (Signed)
 Daily Progress Note   Patient Name: Manuel [name]       Date: 05/03/2023 DOB: Feb 03, 1946  Age: 78 y.o. MRN#: [id number] Attending Physician: Manuel Anthony HERO, MD Primary Care Physician: Manuel Norleen BIRCH, MD Admit Date: 04/18/2023  Reason for Consultation/Follow-up: Establishing goals of care  HPI/Brief Hospital Review: 78 y.o. 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  He then underwent right knee I&D for septic/gouty arthritis on 12/30.   Vascular surgery is recommending aspirin  daily with Plavix  and Lipitor .   ID is consulted for antibiotic recommendations.   PMT was consulted to discuss boundaries and goals of care.   Of note, [name] familiar to PMT services as he has been followed closely during previous hospitalizations.  Subjective: Extensive chart review has been completed prior to meeting patient including labs, vital signs, imaging, progress notes, orders, and available advanced directive documents from current and previous encounters.    Visited with [name] at his bedside. He is lying in bed finishing his breakfast. He shares he has been washed up this morning and changed his clothes which he is resting from as this took a lot of him. Wife Manuel [name] at bedside during time of visit.  Discussed pain regimen with nursing  staff at bedside, nursing staff and [name] continue to feel recent adjustments to [facility] have improved phantom limb pain. [name] reports being to rest well and appetite remains stable. [name] is eager to continue working with therapy.  IVC and 1:1 sitter remain in place.  Answered and addressed all questions and concerns. PMT to continue to follow for ongoing needs and support.  Care plan was discussed with nursing staff  Thank you for allowing the Palliative Medicine Team to assist in the care of this patient.  Total time:  25 minutes  Time spent includes: Detailed review of medical records (labs, imaging, vital  signs), medically appropriate exam (mental status, respiratory, cardiac, skin), discussed with treatment team, counseling and educating patient, family and staff, documenting clinical information, medication management and coordination of care.  Manuel Lesches, DNP, AGNP-C Palliative Medicine   Please contact Palliative Medicine Team phone at [phone] for questions and concerns.

## 2023-05-03 NOTE — Progress Notes (Signed)
 PROGRESS NOTE    Manuel [name]  FMW:[id number] DOB: 08/01/1945 DOA: 04/18/2023 PCP: Rudolpho Norleen BIRCH, MD    Assessment & Plan:   Principal Problem:   Osteomyelitis [facility]) Active Problems:   Acute kidney injury superimposed on chronic kidney disease (HCC)   Hyponatremia   Hypertension   Acute metabolic encephalopathy   Diabetes mellitus type 2, insulin  dependent (HCC)   Uncontrolled type 2 diabetes mellitus with hyperglycemia, with long-term current use of insulin  (HCC)   Mixed hyperlipidemia   Chronic combined systolic and diastolic CHF (congestive heart failure) (HCC)   Coronary artery disease with history of myocardial infarction without history of CABG   Lower limb ischemia right leg with history of angioplasty and stent on 05/14/2021   Essential hypertension   Dyslipidemia   BPH (benign prostatic hyperplasia)   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 Continue on IV rocephin  until 05/05/22 then switch to po cefudroxil and doxy x 2 weeks until 05/20/23 as per ID    Right septic knee: with gouty arthritis. S/p R knee I&D on 12/30. Cx growing MSSA. Holding colchicine . Continue on IV rocephin  as per ID   Suicidal ideation: continue w/ sitter. Needs inpatient psych as per psych Hx of suicidal ideation in the past    Orthostasis with possible arrest: with precipitous BP drop with ambulation to the chair and then called arrest upon return to bed. S/p did receive about 5 minutes of CPR (nursing reports that his wife requested this despite DNR status) despite DNR status. No further CPR is appropriate - discussed with patient and his wife   Acute blood loss anemia: H&H are trending up slightly today. Will transfuse if Hb < 7.0    Intermittent L side pain: chronic. Etiology unclear. Will continue to monitor. Wife reports that this is a chronic issue but seems to worsen after surgeries  Hyponatremia: chronic. Labile    Chronic systolic heart failure with a last EF of 30% in October 202. Hx of ischemic cardiomyopathy. Continue on coreg , entresto , aldactone , farxiga . Holding torsemide .   Continue on entresto , aldactone , coreg , farxiga . Holding torsemide    Acute metabolic encephalopathy: likely multifactorial in the setting of hypovolemia and infectious etiology. Much improved    DM2: poorly controlled. Continue on glargine, SSI w/ accuchecks    BPH: continue on tamsulosin     HLD: continue on statin   PVD: continue on plavix , statin, aspirin    HTN: continue on coreg , entresto , aldactone . Holding torsemide    Chronic pain: continue on percocet. Morphine  prn       DVT prophylaxis: lovenox   Code Status: full  Family Communication:  Disposition Plan: d/c to psych unit vs SNF   Level of care: Med-Surg  Status is: Inpatient Remains inpatient appropriate because: severity of illness    Consultants:  Palliative care  ID Psych  Ortho surg   Procedures:   Antimicrobials: rocephin    Subjective: Pt c/o nerve pain    Objective: Vitals:   05/02/23 0822 05/02/23 1607 05/02/23 2224 05/03/23 0805  BP: 123/61 135/64 132/66 137/68  Pulse: 66 62 71 71  Resp: 18 16 18 16   Temp: 98.2 F (36.8 C)  98.3 F (36.8 C) 97.9 F (36.6 C)  TempSrc: Oral  Oral   SpO2:  98% 99% 97% 96%  Weight:      Height:        Intake/Output Summary (Last 24 hours) at 05/03/2023 0945 Last data filed at 05/02/2023 2125 Gross per 24 hour  Intake 480 ml  Output 2150 ml  Net -1670 ml   Filed Weights   04/18/23 1755  Weight: 83.9 kg    Examination:  General exam: appears uncomfortable Respiratory system: diminished breath sounds b/l  Cardiovascular system: S1 & S2+. No rubs or clicks  Gastrointestinal system: abd  is soft, NT, obese & hypoactive bowel sounds  Central nervous system: alert & oriented. Moves all extremities  Psychiatry: judgement and insight appears improved. Flat mood and affect    Data Reviewed: I have personally reviewed following labs and imaging studies  CBC: Recent Labs  Lab 04/28/23 0544 04/29/23 0521 04/30/23 0531 05/02/23 0524 05/03/23 0106  WBC 10.7* 7.9 7.7 7.5 7.5  NEUTROABS  --  5.0 5.3  --   --   HGB 7.5* 7.2* 7.2* 7.0* 7.2*  HCT 23.1* 21.9* 22.8* 22.1* 22.4*  MCV 84.6 85.9 88.0 85.3 85.2  PLT 453* 456* 486* 508* 519*   Basic Metabolic Panel: Recent Labs  Lab 04/28/23 0544 04/29/23 0521 04/30/23 0531 05/02/23 0524 05/03/23 0106  NA 134* 131* 134* 133* 132*  K 4.9 4.7 5.0 5.1 5.0  CL 105 102 103 104 103  CO2 21* 19* 22 22 23   GLUCOSE 123* 131* 181* 154* 183*  BUN 28* 28* 25* 18 18  CREATININE 0.90 0.81 0.82 0.76 0.78  CALCIUM  8.2* 8.4* 8.6* 8.5* 8.4*   GFR: Estimated Creatinine Clearance: 79 mL/min (by C-G formula based on SCr of 0.78 mg/dL). Liver Function Tests: Recent Labs  Lab 04/30/23 0531  AST 32  ALT 54*  ALKPHOS 93  BILITOT 0.3  PROT 6.3*  ALBUMIN 2.2*   No results for input(s): LIPASE, AMYLASE in the last 168 hours. No results for input(s): AMMONIA in the last 168 hours. Coagulation Profile: No results for input(s): INR, PROTIME in the last 168 hours. Cardiac Enzymes: No results for input(s): CKTOTAL, CKMB, CKMBINDEX, TROPONINI in the last 168 hours. BNP (last 3 results) No results for input(s): PROBNP in the last 8760 hours. HbA1C: No results for input(s): HGBA1C in the last 72 hours. CBG: Recent Labs  Lab 05/02/23 0824 05/02/23 1135 05/02/23 1621 05/02/23 2121 05/03/23 0802  GLUCAP 142* 136* 171* 196* 140*   Lipid Profile: No results for input(s): CHOL, HDL, LDLCALC, TRIG, CHOLHDL, LDLDIRECT in the last 72 hours. Thyroid Function Tests: No results for input(s): TSH, T4TOTAL,  FREET4, T3FREE, THYROIDAB in the last 72 hours. Anemia Panel: No results for input(s): VITAMINB12, FOLATE, FERRITIN, TIBC, IRON , RETICCTPCT in the last 72 hours. Sepsis Labs: No results for input(s): PROCALCITON, LATICACIDVEN in the last 168 hours.  No results found for this or any previous visit (from the past 240 hours).        Radiology Studies: No results found.      Scheduled Meds:  aspirin  EC  81 mg Oral Daily   atorvastatin   80 mg Oral QHS   carvedilol   25 mg Oral BID WC   clopidogrel   75 mg Oral Daily   dapagliflozin  propanediol  10 mg Oral Daily   DULoxetine   20 mg Oral BID   enoxaparin  (LOVENOX ) injection  40 mg Subcutaneous Q24H   folic acid   1 mg Oral Daily   HYDROmorphone   2 mg Oral Q6H   insulin  aspart  0-20 Units  Subcutaneous TID WC   insulin  aspart  0-5 Units Subcutaneous QHS   insulin  glargine-yfgn  17 Units Subcutaneous Daily   iron  polysaccharides  150 mg Oral Daily   methocarbamol   500 mg Oral TID   multivitamin with minerals  1 tablet Oral Daily   nystatin   5 mL Oral QID   polyethylene glycol  17 g Oral Daily   pregabalin   75 mg Oral BID   Ensure Max Protein  11 oz Oral Daily   sacubitril -valsartan   1 tablet Oral Q12H   spironolactone   50 mg Oral Daily   tamsulosin   0.4 mg Oral QPC supper   thiamine   100 mg Oral Daily   traZODone   100 mg Oral QHS   Continuous Infusions:  cefTRIAXone  (ROCEPHIN )  IV 2 g (05/02/23 1457)   vancomycin        LOS: 14 days      Anthony CHRISTELLA Pouch, MD [facility] Pager [phone]  If 7PM-7AM, please contact night-coverage [email or web address] 05/03/2023, 9:45 AM

## 2023-05-03 NOTE — Progress Notes (Signed)
 PT Cancellation Note  Patient Details Name: Manuel [name] MRN: [id number] DOB: 1945-07-25   Cancelled Treatment:     Author attempted to treat pt 4 times throughout the day. Pt unwilling. He has (confirmed by sitter) been OOB several times throughout the day. Currently unwilling to participate even with encouragement. Its been  long day and I just can't do it right now.     [name] 05/03/2023, 3:15 PM

## 2023-05-03 NOTE — Plan of Care (Signed)
  Problem: Fluid Volume: Goal: Hemodynamic stability will improve Outcome: Progressing   Problem: Respiratory: Goal: Ability to maintain adequate ventilation will improve Outcome: Progressing   Problem: Nutritional: Goal: Maintenance of adequate nutrition will improve Outcome: Progressing   Problem: Clinical Measurements: Goal: Will remain free from infection Outcome: Progressing   Problem: Activity: Goal: Risk for activity intolerance will decrease Outcome: Progressing   Problem: Nutrition: Goal: Adequate nutrition will be maintained Outcome: Progressing

## 2023-05-04 DIAGNOSIS — M8600 Acute hematogenous osteomyelitis, unspecified site: Secondary | ICD-10-CM | POA: Diagnosis not present

## 2023-05-04 DIAGNOSIS — Z515 Encounter for palliative care: Secondary | ICD-10-CM | POA: Diagnosis not present

## 2023-05-04 DIAGNOSIS — N179 Acute kidney failure, unspecified: Secondary | ICD-10-CM | POA: Diagnosis not present

## 2023-05-04 DIAGNOSIS — T148XXA Other injury of unspecified body region, initial encounter: Secondary | ICD-10-CM | POA: Diagnosis not present

## 2023-05-04 LAB — BASIC METABOLIC PANEL
Anion gap: 8 (ref 5–15)
BUN: 19 mg/dL (ref 8–23)
CO2: 22 mmol/L (ref 22–32)
Calcium: 8.6 mg/dL — ABNORMAL LOW (ref 8.9–10.3)
Chloride: 102 mmol/L (ref 98–111)
Creatinine, Ser: 0.7 mg/dL (ref 0.61–1.24)
GFR, Estimated: >60 mL/min (ref >60)
Glucose, Bld: 136 mg/dL — ABNORMAL HIGH (ref 70–99)
Potassium: 4.6 mmol/L (ref 3.5–5.1)
Sodium: 132 mmol/L — ABNORMAL LOW (ref 135–145)

## 2023-05-04 LAB — GLUCOSE, CAPILLARY
Glucose-Capillary: 137 mg/dL — ABNORMAL HIGH (ref 70–99)
Glucose-Capillary: 207 mg/dL — ABNORMAL HIGH (ref 70–99)
Glucose-Capillary: 153 mg/dL — ABNORMAL HIGH (ref 70–99)
Glucose-Capillary: 139 mg/dL — ABNORMAL HIGH (ref 70–99)
Glucose-Capillary: 137 mg/dL — ABNORMAL HIGH (ref 70–99)

## 2023-05-04 LAB — CBC
HCT: 23 % — ABNORMAL LOW (ref 39.0–52.0)
Hemoglobin: 7.6 g/dL — ABNORMAL LOW (ref 13.0–17.0)
MCH: 27.7 pg (ref 26.0–34.0)
MCHC: 33 g/dL (ref 30.0–36.0)
MCV: 83.9 fL (ref 80.0–100.0)
Platelets: 489 10*3/uL — ABNORMAL HIGH (ref 150–400)
RBC: 2.74 MIL/uL — ABNORMAL LOW (ref 4.22–5.81)
RDW: 15.6 % — ABNORMAL HIGH (ref 11.5–15.5)
WBC: 6.3 10*3/uL (ref 4.0–10.5)
nRBC: 0 % (ref 0.0–0.2)

## 2023-05-04 MED ORDER — LACTULOSE 10 GM/15ML PO SOLN
20.0000 g | Freq: Two times a day (BID) | ORAL | Status: DC
Start: 2023-05-04 — End: 2023-05-06
  Administered 2023-05-04 – 2023-05-06 (×5): 20 g via ORAL
  Filled 2023-05-04 (×5): qty 30

## 2023-05-04 NOTE — Progress Notes (Signed)
 PROGRESS NOTE    Manuel [name]  FMW:[id number] DOB: 1946/03/22 DOA: 04/18/2023 PCP: Rudolpho Norleen BIRCH, MD    Assessment & Plan:   Principal Problem:   Osteomyelitis [facility]) Active Problems:   Acute kidney injury superimposed on chronic kidney disease (HCC)   Hyponatremia   Hypertension   Acute metabolic encephalopathy   Diabetes mellitus type 2, insulin  dependent (HCC)   Uncontrolled type 2 diabetes mellitus with hyperglycemia, with long-term current use of insulin  (HCC)   Mixed hyperlipidemia   Chronic combined systolic and diastolic CHF (congestive heart failure) (HCC)   Coronary artery disease with history of myocardial infarction without history of CABG   Lower limb ischemia right leg with history of angioplasty and stent on 05/14/2021   Essential hypertension   Dyslipidemia   BPH (benign prostatic hyperplasia)   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 Continue on IV rocephin  until 05/05/22 then switch to po cefudroxil and doxy x 2 weeks until 05/20/23 as per ID    Right septic knee: with gouty arthritis. S/p R knee I&D on 12/30. Cx growing MSSA. Holding colchicine . Continue on IV rocephin  until 05/06/23 then switch to po abxs as per ID    Suicidal ideation: continue w/ sitter. Needs inpatient psych as per psych. Hx of suicidal ideation in the past    Orthostasis with possible arrest: with precipitous BP drop with ambulation to the chair and then called arrest upon return to bed. S/p did receive about 5 minutes of CPR (nursing reports that his wife requested this despite DNR status) despite DNR status. No further CPR is appropriate - discussed with patient and his wife   Acute blood loss anemia: H&H trending up again  today. No need for a transfusion currently   Intermittent L side pain: chronic. Etiology unclear. Will continue to monitor. Wife reports that this is a chronic issue but seems to worsen after surgeries  Hyponatremia: chronic. Labile.    Chronic systolic heart failure with a last EF of 30% in October 202. Hx of ischemic cardiomyopathy. Continue on coreg , entresto , aldactone , farxiga . Holding torsemide .   Acute metabolic encephalopathy: likely multifactorial in the setting of hypovolemia and infectious etiology. Much improved    DM2: poorly controlled. Continue on glargine, SSI w/ accuchecks    BPH: continue on tamsulosin     HLD: continue on statin   PVD: continue on aspirin , plavix , statin   HTN: continue on aldactone , coreg , entresto . Holding torsemide    Chronic pain: continue on percocet. Morphine  prn   Constipation: continue on senokot and started on lactulose      DVT prophylaxis: lovenox   Code Status: full  Family Communication:  Disposition Plan: d/c to psych unit vs SNF   Level of care: Med-Surg  Status is: Inpatient Remains inpatient appropriate because: severity of illness    Consultants:  Palliative care  ID Psych  Ortho surg   Procedures:   Antimicrobials: rocephin    Subjective: Pt c/o pain but slightly improved from day prior   Objective: Vitals:   05/03/23 0805 05/03/23 1552 05/03/23 2001 05/04/23 0437  BP: 137/68 133/60 (!) 111/57 116/61  Pulse: 71 65 69 69  Resp: 16 15 20 18   Temp: 97.9 F (36.6 C) 98.8 F (37.1 C) 98.2 F (36.8  C) 99.1 F (37.3 C)  TempSrc: Axillary Oral Oral Oral  SpO2: 96% 99% 100% 97%  Weight:      Height:        Intake/Output Summary (Last 24 hours) at 05/04/2023 0822 Last data filed at 05/04/2023 0443 Gross per 24 hour  Intake 749.76 ml  Output 2150 ml  Net -1400.24 ml   Filed Weights   04/18/23 1755  Weight: 83.9 kg    Examination:  General exam: appears comfortable  Respiratory system: decreased  breaths sounds b/l  Cardiovascular system: S1/S2+. No rubs or clicks Gastrointestinal system: abd is soft, NT, ND & hypoactive bowels sounds Central nervous system: alert & oriented. Moves all extremities Psychiatry: judgement and insight appears improved. Flat mood and affect    Data Reviewed: I have personally reviewed following labs and imaging studies  CBC: Recent Labs  Lab 04/29/23 0521 04/30/23 0531 05/02/23 0524 05/03/23 0106 05/04/23 0432  WBC 7.9 7.7 7.5 7.5 6.3  NEUTROABS 5.0 5.3  --   --   --   HGB 7.2* 7.2* 7.0* 7.2* 7.6*  HCT 21.9* 22.8* 22.1* 22.4* 23.0*  MCV 85.9 88.0 85.3 85.2 83.9  PLT 456* 486* 508* 519* 489*   Basic Metabolic Panel: Recent Labs  Lab 04/29/23 0521 04/30/23 0531 05/02/23 0524 05/03/23 0106 05/04/23 0432  NA 131* 134* 133* 132* 132*  K 4.7 5.0 5.1 5.0 4.6  CL 102 103 104 103 102  CO2 19* 22 22 23 22   GLUCOSE 131* 181* 154* 183* 136*  BUN 28* 25* 18 18 19   CREATININE 0.81 0.82 0.76 0.78 0.70  CALCIUM  8.4* 8.6* 8.5* 8.4* 8.6*   GFR: Estimated Creatinine Clearance: 79 mL/min (by C-G formula based on SCr of 0.7 mg/dL). Liver Function Tests: Recent Labs  Lab 04/30/23 0531  AST 32  ALT 54*  ALKPHOS 93  BILITOT 0.3  PROT 6.3*  ALBUMIN 2.2*   No results for input(s): LIPASE, AMYLASE in the last 168 hours. No results for input(s): AMMONIA in the last 168 hours. Coagulation Profile: No results for input(s): INR, PROTIME in the last 168 hours. Cardiac Enzymes: No results for input(s): CKTOTAL, CKMB, CKMBINDEX, TROPONINI in the last 168 hours. BNP (last 3 results) No results for input(s): PROBNP in the last 8760 hours. HbA1C: No results for input(s): HGBA1C in the last 72 hours. CBG: Recent Labs  Lab 05/02/23 2121 05/03/23 0802 05/03/23 1137 05/03/23 1751 05/03/23 2002  GLUCAP 196* 140* 168* 134* 181*   Lipid Profile: No results for input(s): CHOL, HDL, LDLCALC, TRIG, CHOLHDL, LDLDIRECT  in the last 72 hours. Thyroid Function Tests: No results for input(s): TSH, T4TOTAL, FREET4, T3FREE, THYROIDAB in the last 72 hours. Anemia Panel: No results for input(s): VITAMINB12, FOLATE, FERRITIN, TIBC, IRON , RETICCTPCT in the last 72 hours. Sepsis Labs: No results for input(s): PROCALCITON, LATICACIDVEN in the last 168 hours.  No results found for this or any previous visit (from the past 240 hours).        Radiology Studies: No results found.      Scheduled Meds:  aspirin  EC  81 mg Oral Daily   atorvastatin   80 mg Oral QHS   carvedilol   25 mg Oral BID WC   clopidogrel   75 mg Oral Daily   dapagliflozin  propanediol  10 mg Oral Daily   DULoxetine   20 mg Oral BID   enoxaparin  (LOVENOX ) injection  40 mg Subcutaneous Q24H   folic acid   1 mg Oral Daily   HYDROmorphone   2 mg  Oral Q6H   insulin  aspart  0-20 Units Subcutaneous TID WC   insulin  aspart  0-5 Units Subcutaneous QHS   insulin  glargine-yfgn  17 Units Subcutaneous Daily   iron  polysaccharides  150 mg Oral Daily   methocarbamol   500 mg Oral TID   multivitamin with minerals  1 tablet Oral Daily   nystatin   5 mL Oral QID   polyethylene glycol  17 g Oral Daily   pregabalin   100 mg Oral BID   Ensure Max Protein  11 oz Oral Daily   sacubitril -valsartan   1 tablet Oral Q12H   spironolactone   50 mg Oral Daily   tamsulosin   0.4 mg Oral QPC supper   thiamine   100 mg Oral Daily   traZODone   100 mg Oral QHS   Continuous Infusions:  cefTRIAXone  (ROCEPHIN )  IV 2 g (05/03/23 1423)   vancomycin  1,500 mg (05/03/23 1429)     LOS: 15 days      Anthony CHRISTELLA Pouch, MD [facility] Pager [phone]  If 7PM-7AM, please contact night-coverage [email or web address] 05/04/2023, 8:22 AM

## 2023-05-04 NOTE — Plan of Care (Signed)
  Problem: Fluid Volume: Goal: Hemodynamic stability will improve Outcome: Progressing   Problem: Coping: Goal: Ability to adjust to condition or change in health will improve Outcome: Progressing   Problem: Nutritional: Goal: Maintenance of adequate nutrition will improve Outcome: Progressing

## 2023-05-04 NOTE — Plan of Care (Signed)

## 2023-05-04 NOTE — Plan of Care (Signed)
  Problem: Clinical Measurements: Goal: Diagnostic test results will improve Outcome: Progressing   Problem: Nutritional: Goal: Maintenance of adequate nutrition will improve 05/04/2023 0413 by Bonnita Cory D, LPN Outcome: Progressing 05/04/2023 0243 by Bonnita Cory BIRCH, LPN Outcome: Progressing   Problem: Education: Goal: Knowledge of General Education information will improve Description: Including pain rating scale, medication(s)/side effects and non-pharmacologic comfort measures Outcome: Progressing   Problem: Tissue Perfusion: Goal: Adequacy of tissue perfusion will improve Outcome: Progressing

## 2023-05-04 NOTE — Progress Notes (Signed)
 Daily Progress Note   Patient Name: Manuel [name]       Date: 05/04/2023 DOB: April 03, 1946  Age: 78 y.o. MRN#: [id number] Attending Physician: Trudy Anthony HERO, MD Primary Care Physician: Rudolpho Norleen BIRCH, MD Admit Date: 04/18/2023  Reason for Consultation/Follow-up: Establishing goals of care  HPI/Brief Hospital Review: 78 y.o. 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  He then underwent right knee I&D for septic/gouty arthritis on 12/30.   Vascular surgery is recommending aspirin  daily with Plavix  and Lipitor .   ID is consulted for antibiotic recommendations.   PMT was consulted to discuss boundaries and goals of care.   Of note, Manuel [name] familiar to PMT services as he has been followed closely during previous hospitalizations.  Subjective: Extensive chart review has been completed prior to meeting patient including labs, vital signs, imaging, progress notes, orders, and available advanced directive documents from current and previous encounters.    Visited with Manuel [name] at his bedside. He is awake, alert and sitting in recliner. He reports feeling well today and motivated to get out of bed. He shares he was able to rest well last night. Transferred to chair with assistance from nursing staff without complications.  He continues to  feel adjustments to pain regimen have significantly improved his pain overall.  IVC and 1:1 sitter remain in place.  No family at bedside during time of visit.  PMT will continue to follow for ongoing needs and support.  Thank you for allowing the Palliative Medicine Team to assist in the care of this patient.  Total time:  25 minutes  Time spent includes: Detailed review of medical records (labs, imaging, vital signs), medically appropriate exam (mental status, respiratory, cardiac, skin), discussed with treatment team, counseling and educating patient, family and staff, documenting clinical information, medication management and coordination of care.  Waddell Lesches, DNP, AGNP-C Palliative Medicine  Please contact Palliative Medicine Team phone at [phone] for questions and concerns.

## 2023-05-05 DIAGNOSIS — I2510 Atherosclerotic heart disease of native coronary artery without angina pectoris: Secondary | ICD-10-CM | POA: Diagnosis not present

## 2023-05-05 DIAGNOSIS — I5042 Chronic combined systolic (congestive) and diastolic (congestive) heart failure: Secondary | ICD-10-CM | POA: Diagnosis not present

## 2023-05-05 DIAGNOSIS — I252 Old myocardial infarction: Secondary | ICD-10-CM

## 2023-05-05 DIAGNOSIS — T148XXA Other injury of unspecified body region, initial encounter: Secondary | ICD-10-CM | POA: Diagnosis not present

## 2023-05-05 DIAGNOSIS — M8600 Acute hematogenous osteomyelitis, unspecified site: Secondary | ICD-10-CM | POA: Diagnosis not present

## 2023-05-05 DIAGNOSIS — E11628 Type 2 diabetes mellitus with other skin complications: Secondary | ICD-10-CM | POA: Diagnosis not present

## 2023-05-05 LAB — BASIC METABOLIC PANEL
Anion gap: 8 (ref 5–15)
BUN: 21 mg/dL (ref 8–23)
CO2: 23 mmol/L (ref 22–32)
Calcium: 8.5 mg/dL — ABNORMAL LOW (ref 8.9–10.3)
Chloride: 101 mmol/L (ref 98–111)
Creatinine, Ser: 0.74 mg/dL (ref 0.61–1.24)
GFR, Estimated: >60 mL/min (ref >60)
Glucose, Bld: 134 mg/dL — ABNORMAL HIGH (ref 70–99)
Potassium: 4.6 mmol/L (ref 3.5–5.1)
Sodium: 132 mmol/L — ABNORMAL LOW (ref 135–145)

## 2023-05-05 LAB — GLUCOSE, CAPILLARY
Glucose-Capillary: 121 mg/dL — ABNORMAL HIGH (ref 70–99)
Glucose-Capillary: 163 mg/dL — ABNORMAL HIGH (ref 70–99)
Glucose-Capillary: 201 mg/dL — ABNORMAL HIGH (ref 70–99)
Glucose-Capillary: 146 mg/dL — ABNORMAL HIGH (ref 70–99)

## 2023-05-05 LAB — CBC
HCT: 23.3 % — ABNORMAL LOW (ref 39.0–52.0)
Hemoglobin: 7.7 g/dL — ABNORMAL LOW (ref 13.0–17.0)
MCH: 27.4 pg (ref 26.0–34.0)
MCHC: 33 g/dL (ref 30.0–36.0)
MCV: 82.9 fL (ref 80.0–100.0)
Platelets: 478 10*3/uL — ABNORMAL HIGH (ref 150–400)
RBC: 2.81 MIL/uL — ABNORMAL LOW (ref 4.22–5.81)
RDW: 15.4 % (ref 11.5–15.5)
WBC: 5.7 10*3/uL (ref 4.0–10.5)
nRBC: 0 % (ref 0.0–0.2)

## 2023-05-05 MED ORDER — POLYETHYLENE GLYCOL 3350 17 G PO PACK: 17.0000 g | PACK | Freq: Every day | ORAL | Status: DC | PRN

## 2023-05-05 MED ORDER — OXYCODONE HCL 5 MG PO TABS
5.0000 mg | ORAL_TABLET | Freq: Four times a day (QID) | ORAL | Status: DC | PRN
  Administered 2023-05-05 – 2023-05-07 (×2): 5 mg via ORAL
  Filled 2023-05-05 (×2): qty 1

## 2023-05-05 NOTE — Progress Notes (Signed)
 PROGRESS NOTE    Manuel [name]  FMW:[id number] DOB: [birth year] DOA: 04/18/2023 PCP: Rudolpho Norleen BIRCH, MD    Assessment & Plan:   Principal Problem:   Osteomyelitis [facility]) Active Problems:   Acute kidney injury superimposed on chronic kidney disease (HCC)   Hyponatremia   Hypertension   Acute metabolic encephalopathy   Diabetes mellitus type 2, insulin  dependent (HCC)   Uncontrolled type 2 diabetes mellitus with hyperglycemia, with long-term current use of insulin  (HCC)   Mixed hyperlipidemia   Chronic combined systolic and diastolic CHF (congestive heart failure) (HCC)   Coronary artery disease with history of myocardial infarction without history of CABG   Lower limb ischemia right leg with history of angioplasty and stent on 05/14/2021   Essential hypertension   Dyslipidemia   BPH (benign prostatic hyperplasia)   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 Continue on IV rocephin  until 05/05/22 then switch to po cefudroxil and doxy x 2 weeks until 05/20/23 as per ID    Right septic knee: with gouty arthritis. S/p R knee I&D on 12/30. Cx growing MSSA. Holding colchicine .  Continue on IV rocephin  until 05/06/23 then switch to po abxs as per ID   Suicidal ideation: d/c sitter & IVC 05/05/23 as per psych. No longer needs inpatient psych admission as per psych.    Orthostasis with possible arrest: with precipitous BP drop with ambulation to the chair and then called arrest upon return to bed. S/p did receive about 5 minutes of CPR (nursing reports that his wife requested this despite DNR status) despite DNR status. No further CPR is appropriate - discussed with patient and his wife   Acute blood loss anemia: H&H are trending up.  No need for a transfusion currently   Intermittent L side pain: chronic. Etiology unclear. Will continue to monitor. Wife reports that this is a chronic issue but seems to worsen after surgeries  Hyponatremia: chronic. Stable     Chronic systolic heart failure with a last EF of 30% in October 202. Hx of ischemic cardiomyopathy.  Continue on aldactone , coreg , entresto , farxiga . Holding torsemide     Acute metabolic encephalopathy: likely multifactorial in the setting of hypovolemia and infectious etiology. Mental status is close to baseline    DM2: poorly controlled.  Continue on glargine, SSI w/ accuchecks    BPH: continue on tamsulosin     HLD: continue on statin   PVD: continue on plavix , statin, aspirin   HTN: continue on entresto , coreg , aldactone . Holding torsemide    Chronic pain: continue on percocet. Morphine  prn   Constipation: continue on senokot, lactulose       DVT prophylaxis: lovenox   Code Status: full  Family Communication: discussed pt's care w/ pt's wife at bedside and answered her questions  Disposition Plan: d/c to SNF  Level of care: Med-Surg  Status is: Inpatient Remains inpatient appropriate because: needs to be sitter free x 48 prior to d/c to SNF as per CM     Consultants:  Palliative care  ID Psych  Ortho surg   Procedures:   Antimicrobials: rocephin    Subjective: Pt c/o fatigue   Objective: Vitals:   05/03/23 2001 05/04/23 0437 05/04/23 0948 05/04/23 2327  BP: (!) 111/57 116/61 116/62 125/65  Pulse: 69 69 70  73  Resp: 20 18 16 17   Temp: 98.2 F (36.8 C) 99.1 F (37.3 C) 97.9 F (36.6 C) 99.3 F (37.4 C)  TempSrc: Oral Oral  Oral  SpO2: 100% 97% 98% 95%  Weight:      Height:        Intake/Output Summary (Last 24 hours) at 05/05/2023 0830 Last data filed at 05/05/2023 0820 Gross per 24 hour  Intake --  Output 900 ml  Net -900 ml   Filed Weights   04/18/23 1755  Weight: 83.9 kg    Examination:  General exam: appears  calm & comfortable  Respiratory system: diminished breath sounds b/l Cardiovascular system: S1 & S2+. No rubs or clicks  Gastrointestinal system: abd is soft, NT, ND & hypoactive bowel sounds  Central nervous system: alert & oriented. Moves all extremities  Psychiatry: judgement and insight appears improved. Appropriate mood and affect   Data Reviewed: I have personally reviewed following labs and imaging studies  CBC: Recent Labs  Lab 04/29/23 0521 04/30/23 0531 05/02/23 0524 05/03/23 0106 05/04/23 0432 05/05/23 0437  WBC 7.9 7.7 7.5 7.5 6.3 5.7  NEUTROABS 5.0 5.3  --   --   --   --   HGB 7.2* 7.2* 7.0* 7.2* 7.6* 7.7*  HCT 21.9* 22.8* 22.1* 22.4* 23.0* 23.3*  MCV 85.9 88.0 85.3 85.2 83.9 82.9  PLT 456* 486* 508* 519* 489* 478*   Basic Metabolic Panel: Recent Labs  Lab 04/30/23 0531 05/02/23 0524 05/03/23 0106 05/04/23 0432 05/05/23 0437  NA 134* 133* 132* 132* 132*  K 5.0 5.1 5.0 4.6 4.6  CL 103 104 103 102 101  CO2 22 22 23 22 23   GLUCOSE 181* 154* 183* 136* 134*  BUN 25* 18 18 19 21   CREATININE 0.82 0.76 0.78 0.70 0.74  CALCIUM  8.6* 8.5* 8.4* 8.6* 8.5*   GFR: Estimated Creatinine Clearance: 79 mL/min (by C-G formula based on SCr of 0.74 mg/dL). Liver Function Tests: Recent Labs  Lab 04/30/23 0531  AST 32  ALT 54*  ALKPHOS 93  BILITOT 0.3  PROT 6.3*  ALBUMIN 2.2*   No results for input(s): LIPASE, AMYLASE in the last 168 hours. No results for input(s): AMMONIA in the last 168 hours. Coagulation Profile: No results for input(s): INR, PROTIME in the last 168 hours. Cardiac Enzymes: No results for input(s): CKTOTAL, CKMB, CKMBINDEX, TROPONINI in the last 168 hours. BNP (last 3 results) No results for input(s): PROBNP in the last 8760 hours. HbA1C: No results for input(s): HGBA1C in the last 72 hours. CBG: Recent Labs  Lab 05/04/23 1213 05/04/23 1530 05/04/23 1732 05/04/23 2129 05/05/23 0816  GLUCAP 207* 153* 139* 137* 121*    Lipid Profile: No results for input(s): CHOL, HDL, LDLCALC, TRIG, CHOLHDL, LDLDIRECT in the last 72 hours. Thyroid Function Tests: No results for input(s): TSH, T4TOTAL, FREET4, T3FREE, THYROIDAB in the last 72 hours. Anemia Panel: No results for input(s): VITAMINB12, FOLATE, FERRITIN, TIBC, IRON , RETICCTPCT in the last 72 hours. Sepsis Labs: No results for input(s): PROCALCITON, LATICACIDVEN in the last 168 hours.  No results found for this or any previous visit (from the past 240 hours).        Radiology Studies: No results found.      Scheduled Meds:  aspirin  EC  81 mg Oral Daily   atorvastatin   80 mg Oral QHS   carvedilol   25 mg Oral BID WC   clopidogrel   75 mg Oral Daily   dapagliflozin  propanediol  10 mg Oral  Daily   DULoxetine   20 mg Oral BID   enoxaparin  (LOVENOX ) injection  40 mg Subcutaneous Q24H   folic acid   1 mg Oral Daily   HYDROmorphone   2 mg Oral Q6H   insulin  aspart  0-20 Units Subcutaneous TID WC   insulin  aspart  0-5 Units Subcutaneous QHS   insulin  glargine-yfgn  17 Units Subcutaneous Daily   iron  polysaccharides  150 mg Oral Daily   lactulose   20 g Oral BID   methocarbamol   500 mg Oral TID   multivitamin with minerals  1 tablet Oral Daily   nystatin   5 mL Oral QID   polyethylene glycol  17 g Oral Daily   pregabalin   100 mg Oral BID   Ensure Max Protein  11 oz Oral Daily   sacubitril -valsartan   1 tablet Oral Q12H   spironolactone   50 mg Oral Daily   tamsulosin   0.4 mg Oral QPC supper   thiamine   100 mg Oral Daily   traZODone   100 mg Oral QHS   Continuous Infusions:  cefTRIAXone  (ROCEPHIN )  IV 2 g (05/04/23 1345)   vancomycin  1,500 mg (05/04/23 1426)     LOS: 16 days      Anthony CHRISTELLA Pouch, MD [facility] Pager [phone]  If 7PM-7AM, please contact night-coverage [email or web address] 05/05/2023, 8:30 AM

## 2023-05-05 NOTE — Plan of Care (Signed)
  Problem: Clinical Measurements: Goal: Diagnostic test results will improve Outcome: Progressing   Problem: Education: Goal: Ability to describe self-care measures that may prevent or decrease complications (Diabetes Survival Skills Education) will improve Outcome: Progressing   Problem: Pain Management: Goal: General experience of comfort will improve Outcome: Progressing   Problem: Safety: Goal: Ability to remain free from injury will improve Outcome: Progressing   Problem: Skin Integrity: Goal: Risk for impaired skin integrity will decrease Outcome: Progressing

## 2023-05-05 NOTE — BH Assessment (Signed)
 IVC  PAPERS  RESCINDED  PER  MD  INFORMED  Manuel Ajibabe  LPN

## 2023-05-05 NOTE — Consult Note (Signed)
 [facility] Psychiatric Consult Follow-up  Patient Name: .Manuel [name]  MRN: [id number]  DOB: [birth year]  Consult Order details:  Orders (From admission, onward)     Start     Ordered   04/28/23 0203  IP CONSULT TO PSYCHIATRY       Ordering Provider: Jawo, Modou L, NP  Provider:  (Not yet assigned)  Question Answer Comment  Location [facility]   Reason for Consult? SI      04/28/23 0202             Mode of Visit: In person    Psychiatry Consult Evaluation  Service Date: May 05, 2023 LOS:  LOS: 16 days  Chief Complaint:I am doing fine  Primary Psychiatric Diagnoses  MDD  Assessment  Manuel [name] is a 78 y.o. male admitted: Medicallyfor 04/18/2023 11:51 PM for  w/ history of osteoarthritis, coronary artery disease, essential hypertension, carotid artery disease, DDD, type 2 diabetes mellitus, gout, peripheral neuropathy who was medically admitted on 04/19/23 after presenting to the emergency department on 04/18/23 w/ AMS and wound infection. 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 Underwent R knee I&D for septic/gouty arthritis on 12/30. Psychiatry consulted for SI. Per available notes, pt reported to RN that he wants to die, wants to kill himself, has a suicide note in mind that he intends to write and leave for his wife. Pt is currently on a 1:1.    Diagnoses:  Active Hospital problems: Principal Problem:   Osteomyelitis (HCC) Active Problems:   Hypertension   Coronary artery disease with history of myocardial infarction without history of CABG   Diabetes mellitus type 2, insulin  dependent (HCC)   Mixed hyperlipidemia   Lower limb ischemia right leg with history of angioplasty and stent on 05/14/2021   Chronic combined systolic and diastolic CHF (congestive heart failure) (HCC)   Acute kidney injury superimposed on chronic kidney disease (HCC)   Hyponatremia   Uncontrolled type 2 diabetes mellitus with hyperglycemia,  with long-term current use of insulin  (HCC)   Essential hypertension   Dyslipidemia   BPH (benign prostatic hyperplasia)   Acute metabolic encephalopathy   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)   Diabetic infection of right foot (HCC)   Wound infection   Current severe episode of major depressive disorder without psychotic features (HCC)    Plan   ## Psychiatric Medication Recommendations:  MDD and Insomnia: Continue trazodone  100mg  oral daily at bedtime -MDD and Neuropathy: Continue duloxetine  20mg  oral 2 times daily  ## Medical Decision Making Capacity:  Patient is able to make his medical decisions by himself  ## Further Work-up:  -- Defer to primary team   ## Disposition:-- There are no psychiatric contraindications to discharge at this time  ## Behavioral / Environmental: -Utilize compassion and acknowledge the patient's experiences while setting clear and realistic expectations for care.    ## Safety and Observation Level:  - Based on my clinical evaluation, I estimate the patient to be at low risk of self harm in the current setting. - At this time, we recommend  routine. This decision is based on my review of the chart including patient's history and current presentation, interview of the patient, mental status examination, and consideration of suicide risk including evaluating suicidal ideation, plan, intent, suicidal or self-harm behaviors, risk factors, and protective factors. This judgment is based on our ability to directly address suicide risk, implement suicide prevention strategies, and develop a  safety plan while the patient is in the clinical setting. Please contact our team if there is a concern that risk level has changed.  CSSR Risk Category:LOW  Suicide Risk Assessment: Patient has following modifiable risk factors for suicide: lack of access to outpatient mental health resources, which we are addressing by asking social work team to providing resources  for [facility] in the community. Patient has following non-modifiable or demographic risk factors for suicide: male gender Patient has the following protective factors against suicide: Supportive family, Supportive friends, and Cultural, spiritual, or religious beliefs that discourage suicide  Thank you for this consult request. Recommendations have been communicated to the primary team.  We will sign off at this time.   Ozell Ferrera, MD       History of Present Illness   Patient Report:  Patient is seen today as follow up. He is noted to be ina good mood.He discussed his initial presentation and the impact of his medical condition on his mood. He states that his biggest support is his wife. He is now willing to engage in rehab and considering future use of prosthetics for his leg. He denies feeling depressed or anxious. He denies SI/HI/intent/plan.He remains future oriented and wants to continue to work on his wellness.he did not endorse auditory/visual hallucinations. He is taking his medications with no reported side effects. He reports fair appetite and sleep.  Psych ROS:  Depression: has good mood, fair appetite and sleep, denies feeling hopeless/worthless; denies SI/HI/plan Anxiety:  denies panic attacks Mania (lifetime and current): no overy grandiose delusions Psychosis: (lifetime and current): denies auditory/visual hallcuinations  Collateral information:  Called wife at [phone]   Left voicemail  ROS : Constitutional:  Negative for chills and fever.  Respiratory:  Negative for shortness of breath.   Cardiovascular:  Negative for chest pain.  Psychiatric and Social History  Psychiatric History:  Prev Dx/Sx: history of depression  Current Psych Provider: denies  Home Meds (current): denies current psychotropics although current orders include remeron  15mg  oral daily at bedtime and trazodone  25mg  oral prn daily at bedtime Previous Med Trials: previously took antidepressant for  less than a week, he is not sure of the name/dose of the antidepressant Therapy: not currently in counseling   Prior Psych Hospitalization: denies prior psych hospitalization  Prior Self Harm: denies history of suicide attempt or non suicidal self injurious behavior   Family Psych History: nephew depression Family Hx suicide: nephew died by suicide by hanging   Social History:  Educational Hx: 1 year of college 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 Legal Hx: No pending charges or court dates Living Situation: Living with wife, Evlyn, in [place]   Substance History Endorses use of alcohol  daily prior to admission, drinking 1/2 gallon of canadian whiskey/week. Denies history of Dts or withdrawal seizures. Denies experiencing withdrawal sx. Has been without alcohol  for 10 days now.    Endorses use of marijuana twice/week to once/month, several puffs/occasion.    Denies use of nicotine, crack/cocaine, methamphetamines, other substances.   Access to weapons/lethal means: denies    Exam Findings  Physical Exam:  Vital Signs:  Temp:  [97.6 F (36.4 C)-99.3 F (37.4 C)] 97.6 F (36.4 C) (01/13 0931) Pulse Rate:  [70-73] 70 (01/13 0931) Resp:  [17-18] 18 (01/13 0931) BP: (124-125)/(65-72) 124/72 (01/13 0931) SpO2:  [95 %-97 %] 97 % (01/13 0931) Blood pressure 124/72, pulse 70, temperature 97.6 F (36.4 C), temperature source Oral, resp. rate 18, height 5' 8 (  1.727 m), weight 83.9 kg, SpO2 97%. Body mass index is 28.12 kg/m.  Physical Exam Vitals and nursing note reviewed.  Constitutional:      Appearance: Normal appearance.  HENT:     Head: Normocephalic.  Eyes:     Pupils: Pupils are equal, round, and reactive to light.  Cardiovascular:     Rate and Rhythm: Normal rate.  Pulmonary:     Effort: Pulmonary effort is normal.  Musculoskeletal:     Cervical back: Normal range of motion.  Skin:    General: Skin is warm.  Neurological:     Mental Status: He  is alert.  Psychiatric:        Mood and Affect: Mood normal.     Mental Status Exam: General Appearance: Casual and Fairly Groomed  Orientation:  Full (Time, Place, and Person)  Memory:  Immediate;   Good Recent;   Good Remote;   Good  Concentration:  Concentration: Good  Recall:  Good  Attention  Fair  Eye Contact:  Good  Speech:  Clear and Coherent  Language:  Good  Volume:  Normal  Mood: fine  Affect:  Appropriate and Congruent  Thought Process:  Coherent and Linear  Thought Content:  Logical  Suicidal Thoughts:  No  Homicidal Thoughts:  No  Judgement:  Fair  Insight:  Fair  Psychomotor Activity:  Normal  Akathisia:  No  Fund of Knowledge:  Fair      Assets:  Manufacturing Systems Engineer Housing Resilience  Cognition:  WNL  ADL's:  Intact one leg amputee  AIMS (if indicated):        Other History   These have been pulled in through the EMR, reviewed, and updated if appropriate.  Family History:  The patient's family history includes Heart disease in his father; Scoliosis in his mother.  Medical History: Past Medical History:  Diagnosis Date   Acute ST elevation myocardial infarction (STEMI) of inferior wall (HCC) 04/19/2010   a.) trans[facility] to [facility] --> LHC/PCI (very difficult procedure) --> 3.0 x 23 mm and 3.0 x 12 mm Xience stents to RCA   Allergies    Arthritis    Benign essential hypertension    Bilateral carotid artery disease (HCC) 05/08/2021   a.) carotid doppler 05/08/2021: 1-39% BICA   CAD (coronary artery disease) 04/19/2010   a.) inferior STEMI 04/19/2010 --> LHC/PCI: 50-70% pD1, 80% pRI, 90/90/90% RCA (overlapping 3.0 x 23 and 3.0 x 12 mm Xience DES); b.) MV 11/10/2018: fixed minimally reversible inferior perfusion defect   Cellulitis of foot    DDD (degenerative disc disease), cervical    Diabetes mellitus type 2, insulin  dependent (HCC)    Diverticulosis    Full dentures    Gout    Hard of hearing    History of bilateral cataract  extraction 2022   History of ETOH abuse    Hyperlipidemia    Ischemic cardiomyopathy 04/19/2010   a.) TTE 04/19/2010: 40%; b.) TTE 04/20/2014: EF >55%, mild RVE, triv PR, mild MR/TR, G1DD; c.) TTE 11/10/2018: EF 45%, inf HK, mild RVE, triv TR/PR, mild MR, G1DD; d.) TTE 11/14/2022: EF 25-30%, basal anteroseptal, apical lateral, apical septal, and apex AK, mid anterolateral HK, mild-mod MR/TR   Long term current use of aspirin     Long term current use of clopidogrel     Lumbar degenerative disc disease    Lumbar radiculopathy    Lumbar vertebral fracture (chronic superior endplate of L1)    OSA (obstructive sleep apnea)  a.) unable to tolerate nocturnal PAP therapy   Peripheral artery disease (HCC)    a.) stenting 05/14/21: 12 mm x 12 cm LifeStent RIGHT dis SFA/prox pop; b.) s/p cath directed thrombolysis RIGHT SFA/pop 06/14/21; c.) s/p mech thrombectomy + stenting 06/15/21: 8 mm x 25cm & 8 mm x 7.5cm Viabahn; d.) s/p BILAT CFA, profunda femoris, SFA endarterectomies + fogarty embolectomy + stenting 11/15/22: 12mm x 58mm Lifestream BILAT CIAs, 14 mm x 6 cm Lifestream & 13 mm x 5 cm Viabahn LEFT EIA   Peripheral neuropathy    Umbilical hernia     Surgical History: Past Surgical History:  Procedure Laterality Date   AMPUTATION Right 11/18/2022   Procedure: AMPUTATION 4TH AND 5TH RAY;  Surgeon: [name], [name];  Location: [facility];  Service: Orthopedics/Podiatry;  Laterality: Right;  4th and 5th toe   AMPUTATION Right 04/20/2023   Procedure: AMPUTATION BELOW KNEE;  Surgeon: [name], MD;  Location: [facility];  Service: Vascular;  Laterality: Right;  block then general   APPLICATION OF WOUND VAC Right 01/09/2023   Procedure: APPLICATION OF WOUND VAC;  Surgeon: [name], MD;  Location: [facility];  Service: Vascular;  Laterality: Right;   CATARACT EXTRACTION W/PHACO Right 03/14/2021   Procedure: CATARACT EXTRACTION PHACO AND INTRAOCULAR LENS PLACEMENT (IOC) RIGHT DIABETIC;  Surgeon:  [name], MD;  Location: [facility];  Service: Ophthalmology;  Laterality: Right;  Diabetic 16.78 01:39.9   CATARACT EXTRACTION W/PHACO Left 03/28/2021   Procedure: CATARACT EXTRACTION PHACO AND INTRAOCULAR LENS PLACEMENT (IOC) LEFT DIABETIC 6.93 01:22.0;  Surgeon: [name], MD;  Location: [facility];  Service: Ophthalmology;  Laterality: Left;  Diabetic   COLONOSCOPY     CORONARY ANGIOPLASTY WITH STENT PLACEMENT  03/2010   Procedure: CORONARY ANGIOPLASTY WITH STENT PLACEMENT; Location: [name]   ENDARTERECTOMY FEMORAL Bilateral 11/15/2022   Procedure: BILATERAL COMMON FEMORAL PROFUNDA FEMORIS AND SUPERFICIAL FEMORAL ARTERY ENDARTECTOMIES, RIGHT FOGARTY EMBOLECTOMY OF THE RIGHT SFA  AND  POPLITEAL ARTERIES. AORTAGRAM AND RIGHT LOWER EXTREMITY ANGIOGRAM.;  Surgeon: [name], MD;  Location: [facility];  Service: Vascular;  Laterality: Bilateral;   INSERTION OF ILIAC STENT Bilateral 11/15/2022   Procedure: BILATERAL STENT INSERTION IN BILATERAL  COMMON ILIAC ARTERY, STENT INSERTION OF LEFT EXTERNAL ILIAC ARTERY. ANGIOPLASTY RIGHT TIBIAL  AND POPLITEAL ARTERY.;  Surgeon: [name], MD;  Location: [facility];  Service: Vascular;  Laterality: Bilateral;   IRRIGATION AND DEBRIDEMENT KNEE Right 04/21/2023   Procedure: IRRIGATION AND DEBRIDEMENT KNEE;  Surgeon: Zafonte, Brian Noemi, MD;  Location: [facility];  Service: Orthopedics;  Laterality: Right;   LOWER EXTREMITY ANGIOGRAPHY Right 05/14/2021   Procedure: LOWER EXTREMITY ANGIOGRAPHY;  Surgeon: [name], MD;  Location: [facility];  Service: Cardiovascular;  Laterality: Right;   LOWER EXTREMITY ANGIOGRAPHY Right 06/14/2021   Procedure: Lower Extremity Angiography;  Surgeon: [name], MD;  Location: [facility];  Service: Cardiovascular;  Laterality: Right;   LOWER EXTREMITY ANGIOGRAPHY Right 11/11/2022   Procedure: Lower Extremity Angiography;  Surgeon: [name], MD;  Location: [facility];  Service: Cardiovascular;  Laterality: Right;   LOWER EXTREMITY INTERVENTION Right 06/15/2021   Procedure: LOWER EXTREMITY INTERVENTION;  Surgeon: [name], MD;  Location: [facility];  Service: Cardiovascular;  Laterality: Right;   TONSILLECTOMY     TRANSMETATARSAL AMPUTATION Right 02/17/2023   Procedure: TRANSMETATARSAL AMPUTATION;  Surgeon: Lennie Barter, [name];  Location: [facility];  Service: Orthopedics/Podiatry;  Laterality:  Right;   WOUND DEBRIDEMENT Right 01/09/2023   Procedure: DEBRIDEMENT WOUND;  Surgeon: [name], MD;  Location: [facility];  Service: Vascular;  Laterality: Right;     Medications:   Current Facility-Administered Medications:    acetaminophen  (TYLENOL ) tablet 650 mg, 650 mg, Oral, Q6H PRN, 650 mg at 04/29/23 1508 **OR** acetaminophen  (TYLENOL ) suppository 650 mg, 650 mg, Rectal, Q6H PRN, Zafonte, Redell Ned, MD   aspirin  EC tablet 81 mg, 81 mg, Oral, Daily, Zafonte, Redell Ned, MD, 81 mg at 05/05/23 0944   atorvastatin  (LIPITOR ) tablet 80 mg, 80 mg, Oral, QHS, Zafonte, Redell Ned, MD, 80 mg at 05/04/23 2135   bisacodyl  (DULCOLAX) EC tablet 5 mg, 5 mg, Oral, Daily PRN, Barbarann Nest, MD, 5 mg at 05/03/23 1057   carvedilol  (COREG ) tablet 25 mg, 25 mg, Oral, BID WC, Zafonte, Redell Ned, MD, 25 mg at 05/05/23 0819   cefTRIAXone  (ROCEPHIN ) 2 g in sodium chloride  0.9 % 100 mL IVPB, 2 g, Intravenous, Q24H, Zeigler, Dustin G, RPH, Last Rate: 200 mL/hr at 05/04/23 1345, 2 g at 05/04/23 1345   clopidogrel  (PLAVIX ) tablet 75 mg, 75 mg, Oral, Daily, Zafonte, Redell Ned, MD, 75 mg at 05/05/23 9056   dapagliflozin  propanediol (FARXIGA ) tablet 10 mg, 10 mg, Oral, Daily, Zafonte, Redell Ned, MD, 10 mg at 05/05/23 0944   DULoxetine  (CYMBALTA ) DR capsule 20 mg, 20 mg, Oral, BID, Lee, Jacqueline Eun, NP, 20 mg at 05/05/23 0944   enoxaparin  (LOVENOX ) injection 40 mg, 40 mg, Subcutaneous, Q24H, Zafonte, Redell Ned, MD, 40 mg at 05/05/23 9048    folic acid  (FOLVITE ) tablet 1 mg, 1 mg, Oral, Daily, Zafonte, Redell Ned, MD, 1 mg at 05/05/23 0944   guaiFENesin  (MUCINEX ) 12 hr tablet 600 mg, 600 mg, Oral, BID PRN, Barbarann Nest, MD, 600 mg at 04/24/23 1620   hydrALAZINE  (APRESOLINE ) tablet 25 mg, 25 mg, Oral, Q6H PRN, Zafonte, Redell Ned, MD   HYDROmorphone  (DILAUDID ) tablet 2 mg, 2 mg, Oral, Q6H, Arvid Collar, FNP, 2 mg at 05/05/23 1206   insulin  aspart (novoLOG ) injection 0-20 Units, 0-20 Units, Subcutaneous, TID WC, Zafonte, Redell Ned, MD, 7 Units at 05/04/23 1341   insulin  aspart (novoLOG ) injection 0-5 Units, 0-5 Units, Subcutaneous, QHS, Zafonte, Redell Ned, MD, 2 Units at 04/29/23 2229   insulin  glargine-yfgn (SEMGLEE ) injection 17 Units, 17 Units, Subcutaneous, Daily, Barbarann Nest, MD, 17 Units at 05/05/23 0947   iron  polysaccharides (NIFEREX) capsule 150 mg, 150 mg, Oral, Daily, Zafonte, Redell Ned, MD, 150 mg at 05/05/23 0944   lactulose  (CHRONULAC ) 10 GM/15ML solution 20 g, 20 g, Oral, BID, Trudy Coop M, MD, 20 g at 05/05/23 0940   menthol -cetylpyridinium (CEPACOL) lozenge 3 mg, 1 lozenge, Oral, PRN, Barbarann Nest, MD   methocarbamol  (ROBAXIN ) tablet 500 mg, 500 mg, Oral, TID, Arvid Collar, FNP, 500 mg at 05/05/23 9056   morphine  (PF) 2 MG/ML injection 2 mg, 2 mg, Intravenous, Q2H PRN, Barbarann Nest, MD, 2 mg at 05/05/23 1033   multivitamin with minerals tablet 1 tablet, 1 tablet, Oral, Daily, Zafonte, Redell Ned, MD, 1 tablet at 05/05/23 9056   nystatin  (MYCOSTATIN ) 100000 UNIT/ML suspension 500,000 Units, 5 mL, Oral, QID, Barbarann Nest, MD, 500,000 Units at 05/05/23 0946   ondansetron  (ZOFRAN ) tablet 4 mg, 4 mg, Oral, Q6H PRN **OR** ondansetron  (ZOFRAN ) injection 4 mg, 4 mg, Intravenous, Q6H PRN, Zafonte, Redell Ned, MD   Oral care mouth rinse, 15 mL, Mouth Rinse, PRN, Barbarann Nest, MD   polyethylene glycol (MIRALAX  / GLYCOLAX ) packet  17 g, 17 g, Oral, Daily, Barbarann Nest, MD, 17 g at  05/05/23 9060   pregabalin  (LYRICA ) capsule 100 mg, 100 mg, Oral, BID, Trudy Anthony HERO, MD, 100 mg at 05/05/23 9056   protein supplement (ENSURE MAX) liquid, 11 oz, Oral, Daily, Zafonte, Redell Ned, MD, 11 oz at 05/05/23 9060   sacubitril -valsartan  (ENTRESTO ) 24-26 mg per tablet, 1 tablet, Oral, Q12H, Barbarann Nest, MD, 1 tablet at 05/05/23 0944   senna-docusate (Senokot-S) tablet 1 tablet, 1 tablet, Oral, QHS PRN, Zafonte, Redell Ned, MD, 1 tablet at 05/03/23 1057   simethicone  (MYLICON) chewable tablet 80 mg, 80 mg, Oral, QID PRN, Barbarann Nest, MD, 80 mg at 04/24/23 1145   spironolactone  (ALDACTONE ) tablet 50 mg, 50 mg, Oral, Daily, Trudy Anthony M, MD, 50 mg at 05/05/23 9056   tamsulosin  (FLOMAX ) capsule 0.4 mg, 0.4 mg, Oral, QPC supper, Zafonte, Redell Ned, MD, 0.4 mg at 05/04/23 1726   thiamine  (VITAMIN B1) tablet 100 mg, 100 mg, Oral, Daily, Zafonte, Redell Ned, MD, 100 mg at 05/05/23 9056   traZODone  (DESYREL ) tablet 100 mg, 100 mg, Oral, QHS, Lee, Jacqueline Eun, NP, 100 mg at 05/04/23 2135   vancomycin  (VANCOREADY) IVPB 1500 mg/300 mL, 1,500 mg, Intravenous, Q24H, Zeigler, Dustin G, RPH, Last Rate: 150 mL/hr at 05/04/23 1426, 1,500 mg at 05/04/23 1426  Allergies: No Known Allergies  Zanita Millman, MD

## 2023-05-05 NOTE — TOC Progression Note (Signed)
 Transition of Care [facility]) - Progression Note    Patient Details  Name: [name] MRN: [id number] Date of Birth: 08/21/1945  Transition [facility]) CM/SW Contact  Royanne JINNY Bernheim, RN Phone Number: 05/05/2023, 1:23 PM  Clinical Narrative:     Met with the patient in the room and reviewed the bed chloices for STR, he chose [facility], Ins auth to be started       Expected Discharge Plan and Services                                               Social Determinants of Health (SDOH) Interventions SDOH Screenings   Food Insecurity: No Food Insecurity (04/19/2023)  Housing: Low Risk  (04/19/2023)  Transportation Needs: No Transportation Needs (04/19/2023)  Utilities: Not At Risk (04/19/2023)  Financial Resource Strain: Patient Unable To Answer (02/20/2023)   Received from [facility]  Social Connections: Unknown (04/22/2023)  Tobacco Use: Low Risk  (04/21/2023)  Recent Concern: Tobacco Use - Medium Risk (01/28/2023)   Received from [facility]    Readmission Risk Interventions    02/18/2023   11:58 AM 02/15/2023    4:10 PM  Readmission Risk Prevention Plan  Transportation Screening  Complete  PCP or Specialist Appt within 3-5 Days Complete Complete  HRI or Home Care Consult Complete Complete  Social Work Consult for Recovery Care Planning/Counseling Complete Complete  Palliative Care Screening Not Applicable Not Applicable  Medication Review Oceanographer) Complete Complete

## 2023-05-05 NOTE — Plan of Care (Signed)

## 2023-05-05 NOTE — Progress Notes (Addendum)
 Palliative Care Progress Note, Assessment & Plan   Patient Name: Manuel [name]       Date: 05/05/2023 DOB: [birth year]  Age: 78 y.o. MRN#: [id number] Attending Physician: Trudy Anthony HERO, MD Primary Care Physician: Rudolpho Norleen BIRCH, MD Admit Date: 04/18/2023  Subjective: Patient is lying in bed in no apparent distress.  He acknowledges my presence and is able to make his wishes known.  He is alert and oriented x 4.  Safety sitter is at bedside.  HPI: 78 y.o. 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  He then underwent right knee I&D for septic/gouty arthritis on 12/30.   Vascular surgery is recommending aspirin  daily with Plavix  and Lipitor .   ID is consulted for antibiotic recommendations.   PMT was consulted to discuss boundaries and goals of care.   Of note, Mr. Witz familiar to PMT services as he has been followed closely during previous hospitalizations.  Summary of counseling/coordination of care: Extensive chart review completed prior to meeting patient including labs, vital signs, imaging, progress notes, orders, and available advanced directive documents from current and previous encounters.   After reviewing the patient's chart and assessing the patient at bedside, spoke with patient in regards to symptom management and goals of  care.  Symptoms assessed.  Patient Dors is his pain is much better.  He endorses that the phantom, click, intermittent pain remains but that the time between the pain episodes is becoming increasingly longer.  He shares the adjustment to p.o. Dilaudid  has significantly improved his pain.  Given increase in narcotic usage, assess patient's bowel function.  He endorses he has not had a bowel movement in almost a week.  He shares that he has bowel movements every 4 to 5 days.  He denies constipation at this time.  He is on a bedpan and attempting to have a BM during my visit.  Reviewed that if patient does not have a BM today, I would encourage him to utilize the as needed medications for constipation.  He has senna and Dulcolax available.  I have added MiraLAX .  Discussed that patient is still using the morphine  IV for breakthrough.  This is not sustainable outside of the hospital.  With plans to discharge to rehab, we discussed utilizing p.o. medicines for pain control.  Patient in agreement to utilize Oxy IR in place of morphine  IV for breakthrough pain.  Recommendation made and accepted by attending to discontinue morphine  IV and start oxycodone  IR 5 every 6 hours for breakthrough pain. RN made aware of adjustment.   I attempted to elicit values and goals important to the patient.  He remains focused on wanting to get stronger so that he can learn how to transfer and move around easier.  Mobility is  important to him as he wishes to return home with his much of mobility as possible.  He also speaks to getting fitted for a prosthetic so that he can become even more mobile.   Plan remains for patient to discharge to SNF for rehab.  TOC following closely for discharge planning.  PMT remains available to patient and family throughout his hospitalization.  PMT will continue to monitor.  Physical Exam Vitals reviewed.  Constitutional:      General: He is not in acute distress.    Appearance: He is  normal weight.  HENT:     Head: Normocephalic.     Mouth/Throat:     Mouth: Mucous membranes are moist.  Eyes:     Pupils: Pupils are equal, round, and reactive to light.  Cardiovascular:     Rate and Rhythm: Normal rate.  Pulmonary:     Effort: Pulmonary effort is normal.  Abdominal:     Palpations: Abdomen is soft.  Musculoskeletal:     Comments: Right BKA MAETC  Skin:    General: Skin is warm and dry.  Neurological:     Mental Status: He is alert and oriented to person, place, and time.  Psychiatric:        Mood and Affect: Mood normal.        Behavior: Behavior normal.        Thought Content: Thought content normal.        Judgment: Judgment normal.             Total Time 50 minutes   Time spent includes: Detailed review of medical records (labs, imaging, vital signs), medically appropriate exam (mental status, respiratory, cardiac, skin), discussed with treatment team, counseling and educating patient, family and staff, documenting clinical information, medication management and coordination of care.  Lamarr L. Arvid, DNP, FNP-BC Palliative Medicine Team

## 2023-05-06 DIAGNOSIS — M00861 Arthritis due to other bacteria, right knee: Secondary | ICD-10-CM | POA: Diagnosis not present

## 2023-05-06 DIAGNOSIS — I998 Other disorder of circulatory system: Secondary | ICD-10-CM | POA: Diagnosis not present

## 2023-05-06 DIAGNOSIS — E871 Hypo-osmolality and hyponatremia: Secondary | ICD-10-CM | POA: Diagnosis not present

## 2023-05-06 DIAGNOSIS — N179 Acute kidney failure, unspecified: Secondary | ICD-10-CM | POA: Diagnosis not present

## 2023-05-06 DIAGNOSIS — T148XXA Other injury of unspecified body region, initial encounter: Secondary | ICD-10-CM | POA: Diagnosis not present

## 2023-05-06 DIAGNOSIS — M8600 Acute hematogenous osteomyelitis, unspecified site: Secondary | ICD-10-CM | POA: Diagnosis not present

## 2023-05-06 LAB — BASIC METABOLIC PANEL
Anion gap: 10 (ref 5–15)
BUN: 16 mg/dL (ref 8–23)
CO2: 23 mmol/L (ref 22–32)
Calcium: 8.8 mg/dL — ABNORMAL LOW (ref 8.9–10.3)
Chloride: 102 mmol/L (ref 98–111)
Creatinine, Ser: 0.88 mg/dL (ref 0.61–1.24)
GFR, Estimated: >60 mL/min (ref >60)
Glucose, Bld: 162 mg/dL — ABNORMAL HIGH (ref 70–99)
Potassium: 4.5 mmol/L (ref 3.5–5.1)
Sodium: 135 mmol/L (ref 135–145)

## 2023-05-06 LAB — CBC
HCT: 24.9 % — ABNORMAL LOW (ref 39.0–52.0)
Hemoglobin: 8 g/dL — ABNORMAL LOW (ref 13.0–17.0)
MCH: 27 pg (ref 26.0–34.0)
MCHC: 32.1 g/dL (ref 30.0–36.0)
MCV: 84.1 fL (ref 80.0–100.0)
Platelets: 456 10*3/uL — ABNORMAL HIGH (ref 150–400)
RBC: 2.96 MIL/uL — ABNORMAL LOW (ref 4.22–5.81)
RDW: 15.4 % (ref 11.5–15.5)
WBC: 5.5 10*3/uL (ref 4.0–10.5)
nRBC: 0 % (ref 0.0–0.2)

## 2023-05-06 LAB — HEPATIC FUNCTION PANEL
ALT: 22 U/L (ref 0–44)
AST: 14 U/L — ABNORMAL LOW (ref 15–41)
Albumin: 2.1 g/dL — ABNORMAL LOW (ref 3.5–5.0)
Alkaline Phosphatase: 85 U/L (ref 38–126)
Bilirubin, Direct: <0.1 mg/dL (ref 0.0–0.2)
Total Bilirubin: 0.5 mg/dL (ref 0.0–1.2)
Total Protein: 6.3 g/dL — ABNORMAL LOW (ref 6.5–8.1)

## 2023-05-06 LAB — GLUCOSE, CAPILLARY
Glucose-Capillary: 175 mg/dL — ABNORMAL HIGH (ref 70–99)
Glucose-Capillary: 200 mg/dL — ABNORMAL HIGH (ref 70–99)
Glucose-Capillary: 136 mg/dL — ABNORMAL HIGH (ref 70–99)
Glucose-Capillary: 135 mg/dL — ABNORMAL HIGH (ref 70–99)

## 2023-05-06 MED ORDER — CEFADROXIL 500 MG PO CAPS
1000.0000 mg | ORAL_CAPSULE | Freq: Two times a day (BID) | ORAL | Status: DC
  Administered 2023-05-07 – 2023-05-08 (×3): 1000 mg via ORAL
  Filled 2023-05-06 (×4): qty 2

## 2023-05-06 MED ORDER — DOXYCYCLINE HYCLATE 100 MG PO TABS
100.0000 mg | ORAL_TABLET | Freq: Two times a day (BID) | ORAL | Status: DC
  Administered 2023-05-07 – 2023-05-08 (×3): 100 mg via ORAL
  Filled 2023-05-06 (×3): qty 1

## 2023-05-06 NOTE — Progress Notes (Signed)
 Palliative Care Progress Note, Assessment & Plan   Patient Name: Manuel [name]       Date: 05/06/2023 DOB: [birth year]  Age: 78 y.o. MRN#: [id number] Attending Physician: Manuel Anthony HERO, MD Primary Care Physician: Manuel Norleen BIRCH, MD Admit Date: 04/18/2023  Subjective: Patient is out of bed and sitting in the recliner.  He is awake, alert and oriented x 4, acknowledges my presence, and is able to make his wishes known.  His wife Manuel [name] is at bedside during my visit.  HPI: 78 y.o. 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  He then underwent right knee I&D for septic/gouty arthritis on 12/30.   Vascular surgery is recommending aspirin  daily with Plavix  and Lipitor .   ID is consulted for antibiotic recommendations.   PMT was consulted to discuss boundaries and goals of care.   Of note, Mr. Staley familiar to PMT services as he has been followed closely during previous hospitalizations.  Summary of counseling/coordination of care: Extensive chart review completed prior to meeting patient including labs, vital signs, imaging, progress notes, orders, and available advanced directive documents from current and previous encounters.   After reviewing the patient's chart and assessing the patient at bedside, I spoke with patient in regards to symptom  management and goals of care.   Symptoms assessed.  Patient endorses his pain is much better controlled.  He endorses he had a great nights rest.  He shares that his sleep has been quite challenging in the past and was only given melatonin.  However now he is sleeping much better.  Bowel regimen reviewed.  Pat patient was able to have a bowel movement yesterday.  He shares it was quite large.  It provided him a lot of relief.  Discussed importance of bowel regimen in addition to pain management.  Discussed use of daily stool softeners as well as as needed MiraLAX  to avoid constipation.  Patient appreciated discussion.  He endorses his pain is well-controlled with 2 mg of Dilaudid .  He is oftentimes awakened and asked if he wants pain medicine.  Therefore, he was in agreement for me to adjust pain medications to be given just during the day.  Discussed next steps.  Patient remains in agreement to go to rehab for a short amount of time in hopes of increasing mobility and strength to be able to return home.  TOC following closely for discharge planning.  Discussed importance of primary care follow-up once at home.  Patient shares he is not pleased with his current PCP.  Discussed switching to someone new.  I recommended patient have the option for virtual visits or at home visit since mobility is challenging for him.  PMT will continue to follow and support patient and family throughout his hospitalization.  No adjustment to goals or plan of care at this time.  Physical Exam Vitals reviewed.  Constitutional:      General: He is not in acute distress. HENT:     Head: Normocephalic.     Nose: Nose normal.     Mouth/Throat:     Mouth: Mucous membranes are moist.  Eyes:     Pupils: Pupils are equal, round, and reactive to light.  Cardiovascular:     Rate and Rhythm: Normal rate.  Pulmonary:     Effort: Pulmonary effort is normal.  Abdominal:     Palpations: Abdomen is soft.  Skin:     General: Skin is warm and dry.  Neurological:     Mental Status: He is alert and oriented to person, place, and time.  Psychiatric:        Mood and Affect: Mood normal.        Behavior: Behavior normal.        Thought Content: Thought content normal.        Judgment: Judgment normal.             Total Time 35 minutes   Time spent includes: Detailed review of medical records (labs, imaging, vital signs), medically appropriate exam (mental status, respiratory, cardiac, skin), discussed with treatment team, counseling and educating patient, family and staff, documenting clinical information, medication management and coordination of care.  Manuel L. Arvid, DNP, FNP-BC Palliative Medicine Team

## 2023-05-06 NOTE — Progress Notes (Signed)
 Physical Therapy Treatment Patient Details Name: Manuel [name] MRN: [id number] DOB: October 10, 1945 Today's Date: 05/06/2023   History of Present Illness Pt is a 78 y.o. male presenting to hospital 04/19/23 with c/o AMS, weakness, fall, and R leg swelling/pain. Recently seen in ED for fall onto R knee causing effusion and pain; pt s/p R foot TMA 2 months prior. Pt admitted with sepsis d/t cellulitis, AKI superimposed on CKD, hyponatremia, acute metabolic encephalopathy. 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 S/p R knee aspiration 04/20/23. S/p R knee joint I&D 04/21/23 d/t R septic knee. 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    PT Comments  Pt is making good progress towards goals- updated goals this date. Pt able to transfer bed->chair; still requiring +2 assist with poor balance. Unable to hop on L LE and shuffles. Educated on R knee ROM-will continue to monitor each session. Reviewed HEP. Of note, pt with BM in bed. Assisted with clean up and rolling to B sides prior to donning pt underwear. Will continue to progress.    If plan is discharge home, recommend the following: Assistance with cooking/housework;Direct supervision/assist for medications management;Direct supervision/assist for financial management;Assist for transportation;Help with stairs or ramp for entrance;Supervision due to cognitive status;Two people to help with walking and/or transfers;Two people to help with bathing/dressing/bathroom   Can travel by private vehicle     No  Equipment Recommendations  Other (comment)    Recommendations for Other Services       Precautions / Restrictions Precautions Precautions: Fall Restrictions Weight Bearing Restrictions Per Provider Order: Yes RLE Weight Bearing Per Provider Order: Non weight bearing Other Position/Activity Restrictions: R BKA     Mobility  Bed Mobility Overal bed  mobility: Needs Assistance Bed Mobility: Supine to Sit, Rolling Rolling: Min assist   Supine to sit: Contact guard     General bed mobility comments: needs assist for positioning in order to roll and min assist for completing activity. Once seated at EOB, upright posture noted    Transfers Overall transfer level: Needs assistance Equipment used: Rolling walker (2 wheels) Transfers: Sit to/from Stand, Bed to chair/wheelchair/BSC Sit to Stand: Max assist Stand pivot transfers: Max assist, +2 physical assistance         General transfer comment: transfer to recliner with +2 assist. Very minimal shuffle with L knee slightly flexed. Pt SOB with exertion    Ambulation/Gait               General Gait Details: currently unable   Stairs             Wheelchair Mobility     Tilt Bed    Modified Rankin (Stroke Patients Only)       Balance Overall balance assessment: Needs assistance Sitting-balance support: No upper extremity supported, Feet supported Sitting balance-Leahy Scale: Fair     Standing balance support: Bilateral upper extremity supported, During functional activity, Reliant on assistive device for balance Standing balance-Leahy Scale: Poor                              Cognition Arousal: Alert Behavior During Therapy: WFL for tasks assessed/performed Overall Cognitive Status: Within Functional Limits for tasks assessed  General Comments: pleasant and agreeable to session        Exercises Total Joint Exercises Goniometric ROM: R knee AROM: 15-70 degrees Other Exercises Other Exercises: Educated on knee ROM. Able to perform quad sets and LAQ. 10 reps. Also talked about stretching using towel roll.    General Comments        Pertinent Vitals/Pain Pain Assessment Pain Assessment: Faces Faces Pain Scale: Hurts little more Pain Location: distal stump Pain Descriptors / Indicators:  Sore Pain Intervention(s): Limited activity within patient's tolerance, Repositioned    Home Living                          Prior Function            PT Goals (current goals can now be found in the care plan section) Acute Rehab PT Goals Patient Stated Goal: to feel better and have less pain PT Goal Formulation: With patient/family Time For Goal Achievement: 05/20/23 Potential to Achieve Goals: Fair Progress towards PT goals: Progressing toward goals    Frequency    Min 1X/week      PT Plan      Co-evaluation              AM-PAC PT 6 Clicks Mobility   Outcome Measure  Help needed turning from your back to your side while in a flat bed without using bedrails?: A Little Help needed moving from lying on your back to sitting on the side of a flat bed without using bedrails?: A Little Help needed moving to and from a bed to a chair (including a wheelchair)?: A Lot Help needed standing up from a chair using your arms (e.g., wheelchair or bedside chair)?: A Lot Help needed to walk in hospital room?: Total Help needed climbing 3-5 steps with a railing? : Total 6 Click Score: 12    End of Session Equipment Utilized During Treatment: Gait belt Activity Tolerance: Patient tolerated treatment well Patient left: in chair Nurse Communication: Mobility status;Precautions;Other (comment) PT Visit Diagnosis: Other abnormalities of gait and mobility (R26.89);Muscle weakness (generalized) (M62.81);History of falling (Z91.81);Pain Pain - Right/Left: Right Pain - part of body: Knee;Leg     Time: 8971-8896 PT Time Calculation (min) (ACUTE ONLY): 35 min  Charges:    $Therapeutic Exercise: 8-22 mins $Therapeutic Activity: 8-22 mins PT General Charges $$ ACUTE PT VISIT: 1 Visit                     Corean Dade, PT, DPT, GCS [phone]    Diyana Starrett 05/06/2023, 11:19 AM

## 2023-05-06 NOTE — Progress Notes (Signed)
 Progress Note    05/06/2023 10:06 AM 15 Days Post-Op  Subjective:   Manuel [name] is a 78 yo male who is now POD #15 from right below the knee amputation.  Patient is resting comfortably in bed and sleeping this morning when I arrived.  Dressing is in place clean dry and intact.  Nursing states that they had just changed dressing.  No complications today.  Patient remains with a sitter due to suicidal ideation.  Patient continues on IV antibiotics as scheduled.  Vitals are remained stable.    Vitals:   05/05/23 2153 05/06/23 0859  BP: (!) 150/78 130/72  Pulse: 75 71  Resp: 18 16  Temp: 98 F (36.7 C) 98.6 F (37 C)  SpO2: 94% 92%   Physical Exam: Cardiac:  RRR, Normal S1, S2. No rubs, clicks or gallup's. No Murmurs.  Lungs:  Clear on auscultation throughout, no rales rhonchi or wheezing.  Incisions:  Right BKA incision clean and dry. Staples intact. No infection to note.  Extremities:  Left lower extremity is warm to touch with palpable pulses.  Abdomen:  Positive bowel sounds throughout, soft, non tender and non distended.  Neurologic: AAOX3  CBC    Component Value Date/Time   WBC 5.5 05/06/2023 0602   RBC 2.96 (L) 05/06/2023 0602   HGB 8.0 (L) 05/06/2023 0602   HGB 15.7 02/16/2012 2013   HCT 24.9 (L) 05/06/2023 0602   HCT 44.4 02/16/2012 2013   PLT 456 (H) 05/06/2023 0602   PLT 176 02/16/2012 2013   MCV 84.1 05/06/2023 0602   MCV 91 02/16/2012 2013   MCH 27.0 05/06/2023 0602   MCHC 32.1 05/06/2023 0602   RDW 15.4 05/06/2023 0602   RDW 13.0 02/16/2012 2013   LYMPHSABS 1.5 04/30/2023 0531   MONOABS 0.8 04/30/2023 0531   EOSABS 0.1 04/30/2023 0531   BASOSABS 0.0 04/30/2023 0531    BMET    Component Value Date/Time   NA 135 05/06/2023 0602   NA 137 02/16/2012 2013   K 4.5 05/06/2023 0602   K 3.8 02/16/2012 2013   CL 102 05/06/2023 0602   CL 102 02/16/2012 2013   CO2 23 05/06/2023 0602   CO2 25 02/16/2012 2013   GLUCOSE 162 (H) 05/06/2023 0602   GLUCOSE  154 (H) 02/16/2012 2013   BUN 16 05/06/2023 0602   BUN 10 02/16/2012 2013   CREATININE 0.88 05/06/2023 0602   CREATININE 0.72 02/16/2012 2013   CALCIUM  8.8 (L) 05/06/2023 0602   CALCIUM  9.6 02/16/2012 2013   GFRNONAA >60 05/06/2023 0602   GFRNONAA >60 02/16/2012 2013   GFRAA >60 12/09/2018 0929   GFRAA >60 02/16/2012 2013    INR    Component Value Date/Time   INR 1.4 (H) 04/19/2023 0442     Intake/Output Summary (Last 24 hours) at 05/06/2023 1006 Last data filed at 05/06/2023 0600 Gross per 24 hour  Intake --  Output 2300 ml  Net -2300 ml     Assessment/Plan:  78 y.o. male is s/p  Right Below the knee amputation   15 Days Post-Op   PLAN Continue antibiotics as ordered. PT OT OOB to bedside chair during the day. Continue with sitter as ordered by psych. Right BKA staple removal in 8 days if patient still. If patient is dispo to facility or home patient will need to return to [facility] V[facility] surgery for follow-up and staple removal in 8 days.   DVT prophylaxis: Aspirin  81 mg daily, Plavix  75 mg daily.  [name] [facility] 05/06/2023 10:06 AM

## 2023-05-06 NOTE — Progress Notes (Signed)
 Date of Admission:  04/18/2023  Manuel [name] is a 78 y.o. 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, followed by TMA in oct 2024 presented with infected TMA site Pt has complicated PAD history and foot infection    he had endarterectomies of both rt and left common, profunda and superficial femoral artery, fogary embolectomy of rt SFA, stent placement in b/l common iliac arteries, left external iliac and Rt PTA, on 11/15/22  Had wound dehiscence on the rt groin  , he underwent debridement of the wound on the rt groin on 01/09/23 with excision of 15 cm2 of skin, soft tissue, muscle fascia and application of neg pressure vac He had serratia , staph aureus and ecoli in culture and received Iv antibiotic followed by PO cipro + Augmentin  He also underwent TMA on 02/17/23 He followed with Podiatrist as OP On 12/24 he came to the ED with rt knee and leg pain after he fell out of bed. The TMA site was red , weeping and and he was started on keflex  and bactrim  and discharged from ED. Xray was done in the ED on 12/14 and it did not show any fracture.   He returned to the ED with rt leg swelling upto thigh , pain and confusion   ID: Manuel [name] is a 78 y.o. male  Principal Problem:   Osteomyelitis (HCC) Active Problems:   Hypertension   Coronary artery disease with history of myocardial infarction without history of CABG   Diabetes mellitus type 2, insulin  dependent (HCC)   Mixed hyperlipidemia   Lower limb ischemia right leg with history of angioplasty and stent on 05/14/2021   Chronic combined systolic and diastolic CHF (congestive heart failure) (HCC)   Acute kidney injury superimposed on chronic kidney disease (HCC)   Hyponatremia   Uncontrolled type 2 diabetes mellitus with hyperglycemia, with long-term current use of insulin  (HCC)   Essential hypertension   Dyslipidemia   BPH (benign prostatic hyperplasia)   Acute  metabolic encephalopathy   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)   Diabetic infection of right foot (HCC)   Wound infection   Current severe episode of major depressive disorder without psychotic features (HCC)   Subjective: Wife at bedside Patient is sleeping She says he has been doing  Medications:   aspirin  EC  81 mg Oral Daily   atorvastatin   80 mg Oral QHS   carvedilol   25 mg Oral BID WC   [START ON 05/07/2023] cefadroxil   1,000 mg Oral BID   clopidogrel   75 mg Oral Daily   dapagliflozin  propanediol  10 mg Oral Daily   [START ON 05/07/2023] doxycycline   100 mg Oral Q12H   DULoxetine   20 mg Oral BID   enoxaparin  (LOVENOX ) injection  40 mg Subcutaneous Q24H   folic acid   1 mg Oral Daily   HYDROmorphone   2 mg Oral Q6H   insulin  aspart  0-20 Units Subcutaneous TID WC   insulin  aspart  0-5 Units Subcutaneous QHS   insulin  glargine-yfgn  17 Units Subcutaneous Daily   iron  polysaccharides  150 mg Oral Daily   methocarbamol   500 mg Oral TID   multivitamin with minerals  1 tablet Oral Daily   nystatin   5 mL Oral QID   polyethylene glycol  17 g Oral Daily   pregabalin   100 mg Oral BID   Ensure Max Protein  11 oz Oral Daily  sacubitril -valsartan   1 tablet Oral Q12H   spironolactone   50 mg Oral Daily   tamsulosin   0.4 mg Oral QPC supper   thiamine   100 mg Oral Daily   traZODone   100 mg Oral QHS    Objective: Vital signs in last 24 hours: Patient Vitals for the past 24 hrs:  BP Temp Temp src Pulse Resp SpO2  05/06/23 0859 130/72 98.6 F (37 C) -- 71 16 92 %  05/05/23 2153 (!) 150/78 98 F (36.7 C) Oral 75 18 94 %      PHYSICAL EXAM:  General: Sleeping  lungs:b/l air entry Heart: s1s2 Extremities: rt BKA- Dressing in place Skin: No rashes or lesions. Or bruising   Lab Results    Latest Ref Rng & Units 05/06/2023    6:02 AM 05/05/2023    4:37 AM 05/04/2023    4:32 AM  CBC  WBC 4.0 - 10.5 K/uL 5.5  5.7  6.3   Hemoglobin 13.0 - 17.0 g/dL 8.0  7.7  7.6    Hematocrit 39.0 - 52.0 % 24.9  23.3  23.0   Platelets 150 - 400 K/uL 456  478  489        Latest Ref Rng & Units 05/06/2023    6:02 AM 05/05/2023    4:37 AM 05/04/2023    4:32 AM  CMP  Glucose 70 - 99 mg/dL 837  865  863   BUN 8 - 23 mg/dL 16  21  19    Creatinine 0.61 - 1.24 mg/dL 9.11  9.25  9.29   Sodium 135 - 145 mmol/L 135  132  132   Potassium 3.5 - 5.1 mmol/L 4.5  4.6  4.6   Chloride 98 - 111 mmol/L 102  101  102   CO2 22 - 32 mmol/L 23  23  22    Calcium  8.9 - 10.3 mg/dL 8.8  8.5  8.6       Microbiology: Synovial fluid- no organisms seen  Culture neg so far    Assessment/Plan:  ?Rt TMA site infection- underwent BKA Wound culture was MSSA   Rt knee septic arthritis- s/p I/D   On ceftriaxone  and vanco- - culture no growth - continue both antibiotics by IV for a minimum of 2 weeks ( 05/06/23)  and then can switch to PO cefudroxil 500mg  BID  + Doxy 100mg  BID X 2 weeks until 05/20/23   DM- management as per primary team   PAD-- h/o endarterectomies of both rt and left common, profunda and superficial femoral artery, fogary embolectomy of rt SFA, stent placement in b/l common iliac arteries, left external iliac and Rt PTA, on 11/15/22    AKI- resolved   Anemia   Intermittent and chronic  left sided abdominal pain-management as per hospitalist In 2023 Ct  showed colonic diverticulosis, saccular aneursym of 2.3 cm and superior end plate fracture of l1  Discussed the management with  wife Will follow him as OP ID will sign off- call if needed

## 2023-05-06 NOTE — TOC Progression Note (Signed)
 Transition of Care [facility]) - Progression Note    Patient Details  Name: Manuel [name] MRN: [id number] Date of Birth: [birth year]  [facility] of [name]) CM/SW Contact  Royanne JINNY Bernheim, RN Phone Number: 05/06/2023, 9:50 AM  Clinical Narrative:    Ins pending to go to [facility]        Expected Discharge Plan and Services                                               Social Determinants of Health (SDOH) Interventions SDOH Screenings   Food Insecurity: No Food Insecurity (04/19/2023)  Housing: Low Risk  (04/19/2023)  Transportation Needs: No Transportation Needs (04/19/2023)  Utilities: Not At Risk (04/19/2023)  Financial Resource Strain: Patient Unable To Answer (02/20/2023)   Received from [facility]  Social Connections: Unknown (04/22/2023)  Tobacco Use: Low Risk  (04/21/2023)  Recent Concern: Tobacco Use - Medium Risk (01/28/2023)   Received from [facility]    Readmission Risk Interventions    02/18/2023   11:58 AM 02/15/2023    4:10 PM  Readmission Risk Prevention Plan  Transportation Screening  Complete  PCP or Specialist Appt within 3-5 Days Complete Complete  HRI or Home Care Consult Complete Complete  Social Work Consult for Recovery Care Planning/Counseling Complete Complete  Palliative Care Screening Not Applicable Not Applicable  Medication Review Oceanographer) Complete Complete

## 2023-05-06 NOTE — Plan of Care (Signed)
  Problem: Clinical Measurements: Goal: Diagnostic test results will improve Outcome: Progressing Goal: Signs and symptoms of infection will decrease Outcome: Progressing   Problem: Coping: Goal: Ability to adjust to condition or change in health will improve Outcome: Progressing   Problem: Health Behavior/Discharge Planning: Goal: Ability to manage health-related needs will improve Outcome: Progressing   Problem: Skin Integrity: Goal: Risk for impaired skin integrity will decrease Outcome: Progressing   Problem: Clinical Measurements: Goal: Diagnostic test results will improve Outcome: Progressing   Problem: Activity: Goal: Risk for activity intolerance will decrease Outcome: Progressing

## 2023-05-06 NOTE — Progress Notes (Signed)
 PROGRESS NOTE   HPI was taken from Dr. Lawence: Manuel [name] is a 78 y.o. Caucasian male with medical history significant for osteoarthritis, coronary artery disease, essential hypertension, carotid artery disease, DDD, type 2 diabetes mellitus, gout, dyslipidemia and ischemic cardiomyopathy as well as OSA and peripheral neuropathy, who presented to the emergency room with acute onset of altered mental status with confusion.  He had a right foot transmetatarsal amputation by Dr. [name] this summer.  Since then he has had an ulcer which has been draining.  He admitted to associated right foot pain with swelling and erythema.  He was seen in the ER for mechanical fall 4 days ago and was given p.o. Bactrim  and Keflex  which he has been taking but missed Christmas Day.  He was having mild AKI then.  He admitted to diminish fluid intake likely.  Did not any nausea or vomiting or diarrhea or abdominal pain.  No fever or chills.  No chest pain or palpitations.  No cough or wheezing or dyspnea.   ED Course: When he came to the ER, vital signs were within normal and later BP was 149/85.  Labs revealed hyponatremia 123 and hypochloremia of 90 with a CO2 of 21 blood glucose of 249, BUN of 71 and creatinine 1.98 up from 48 and 1.51 on 12/24 and albumin 3.2 with AST 55 and ALT 48.  CBC showed leukocytosis 16.5 and anemia.  Procalcitonin was elevated 2.13.  Lactic acid was 1.1.  UA showed more than 500 glucose.  Blood cultures were drawn.  EKG as reviewed by me : None Imaging:  Right lower extremity ultrasound showed no DVT.   The patient was given IV vancomycin  and cefepime , 1 mg of IV Dilaudid  and 1 L bolus of IV normal saline.  He will be admitted to a medical telemetry bed for further evaluation and management.   As per Dr. Barbarann: [phone] with h/o CAD, HTN, DM, and chronic systolic CHF who presented on 87/72 with AMS and wound infection s/p recent I&D of R groin wound and prior R 4th/5th partial ray amputation  (7/29). 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 He then underwent R knee I&D for septic/gouty arthritis on 12/30. He will need SNF rehab. Vascular surgery is recommending ASA 81 mg daily + Plavix  and Lipitor . ID is consulting for antibiotic recommendations.    As per Dr. Trudy 1/8-1/14/24: Pt has remained medically stable this week. Psych re-evaluated the pt and rescinded IVC and pt no longer meets inpatient psych criteria. Sitter was d/c on 05/05/23. PT/OT recs SNF. Pt chose [facility] and insurance shara is pending as per CM.     Manuel [name]  FMW:[id number] DOB: 11-20-1945 DOA: 04/18/2023 PCP: Rudolpho Norleen BIRCH, MD    Assessment & Plan:   Principal Problem:   Osteomyelitis [facility]) Active Problems:   Acute kidney injury superimposed on chronic kidney disease (HCC)   Hyponatremia   Hypertension   Acute metabolic encephalopathy   Diabetes mellitus type 2, insulin  dependent (HCC)   Uncontrolled type 2 diabetes mellitus with hyperglycemia, with long-term current use of insulin  (HCC)   Mixed hyperlipidemia   Chronic combined systolic and diastolic CHF (congestive heart failure) (HCC)   Coronary artery disease with history of myocardial infarction without history of CABG   Lower limb ischemia right leg with history of angioplasty and stent on 05/14/2021   Essential hypertension   Dyslipidemia   BPH (benign prostatic hyperplasia)   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 Continue on IV rocephin  until 05/05/22 then switch to po cefudroxil and doxy x 2 weeks until 05/20/23 as per ID    Right septic knee: with gouty arthritis. S/p R knee I&D on 12/30. Cx growing MSSA. Holding colchicine .  Continue on IV rocephin  until  05/05/22 then switch to po abxs as per ID   Suicidal ideation: d/c sitter & IVC 05/05/23 as per psych. No longer needs inpatient psych admission as per psych    Orthostasis with possible arrest: with precipitous BP drop with ambulation to the chair and then called arrest upon return to bed. S/p did receive about 5 minutes of CPR (nursing reports that his wife requested this despite DNR status) despite DNR status. No further CPR is appropriate - discussed with patient and his wife   Acute blood loss anemia: H&H continue to trend up. No need for a transfusion currently   Intermittent L side pain: chronic. Etiology unclear. Will continue to monitor. Wife reports that this is a chronic issue but seems to worsen after surgeries  Hyponatremia: chronic. WNL today    Chronic systolic heart failure with a last EF of 30% in October 202. Hx of ischemic cardiomyopathy.  Continue on coreg , aldactone , entresto , farxiga . Holding torsemide .   Acute metabolic encephalopathy: likely multifactorial in the setting of hypovolemia and infectious etiology. Mental status is back to baseline. Resolved    DM2: poorly controlled. Continue on glargine, SSI w/ accuchecks    BPH: continue on tamsulosin      HLD: continue on statin   PVD: continue on plavix , aspirin , statin   HTN: continue on aldactone , entresto , coreg . Holding torsemide    Chronic pain: continue on oxycodone    Constipation: resolved    DVT prophylaxis: lovenox   Code Status: full  Family Communication: discussed pt's care w/ pt's wife at bedside and answered her questions  Disposition Plan: d/c to SNF  Level of care: Med-Surg  Status is: Inpatient Remains inpatient appropriate because: needs to be sitter free x 48 prior to d/c to SNF as per CM     Consultants:  Palliative care  ID Psych  Ortho surg   Procedures:   Antimicrobials: cefadroxil , doxy    Subjective: Pt c/o pain   Objective: Vitals:   05/04/23 2327 05/05/23  0931 05/05/23 1555 05/05/23 2153  BP: 125/65 124/72 124/66 (!) 150/78  Pulse: 73 70 69 75  Resp: 17 18 17 18   Temp: 99.3 F (37.4 C) 97.6 F (36.4 C) 97.6 F (36.4 C) 98 F (36.7 C)  TempSrc: Oral Oral Oral Oral  SpO2: 95% 97% 97% 94%  Weight:      Height:        Intake/Output Summary (Last 24 hours) at 05/06/2023 0846 Last data filed at 05/06/2023 0600 Gross per 24 hour  Intake --  Output 2300 ml  Net -2300 ml   Filed Weights   04/18/23 1755  Weight: 83.9 kg    Examination:  General exam: appears comfortable  Respiratory system: decreased breath sounds b/l  Cardiovascular system: S1/S2+. No rubs or clicks  Gastrointestinal system: abd is soft, NT, ND & hypoactive bowel sounds   Central nervous system: alert & oriented. Moves all extremities  Psychiatry:  judgement and insight appears  baseline. Appropriate mood and affect   Data Reviewed: I have personally reviewed following labs and imaging studies  CBC: Recent Labs  Lab 04/30/23 0531 05/02/23 0524 05/03/23 0106 05/04/23 0432 05/05/23 0437 05/06/23 0602  WBC 7.7 7.5 7.5 6.3 5.7 5.5  NEUTROABS 5.3  --   --   --   --   --   HGB 7.2* 7.0* 7.2* 7.6* 7.7* 8.0*  HCT 22.8* 22.1* 22.4* 23.0* 23.3* 24.9*  MCV 88.0 85.3 85.2 83.9 82.9 84.1  PLT 486* 508* 519* 489* 478* 456*   Basic Metabolic Panel: Recent Labs  Lab 05/02/23 0524 05/03/23 0106 05/04/23 0432 05/05/23 0437 05/06/23 0602  NA 133* 132* 132* 132* 135  K 5.1 5.0 4.6 4.6 4.5  CL 104 103 102 101 102  CO2 22 23 22 23 23   GLUCOSE 154* 183* 136* 134* 162*  BUN 18 18 19 21 16   CREATININE 0.76 0.78 0.70 0.74 0.88  CALCIUM  8.5* 8.4* 8.6* 8.5* 8.8*   GFR: Estimated Creatinine Clearance: 71.8 mL/min (by C-G formula based on SCr of 0.88 mg/dL). Liver Function Tests: Recent Labs  Lab 04/30/23 0531  AST 32  ALT 54*  ALKPHOS 93  BILITOT 0.3  PROT 6.3*  ALBUMIN 2.2*   No results for input(s): LIPASE, AMYLASE in the last 168 hours. No results  for input(s): AMMONIA in the last 168 hours. Coagulation Profile: No results for input(s): INR, PROTIME in the last 168 hours. Cardiac Enzymes: No results for input(s): CKTOTAL, CKMB, CKMBINDEX, TROPONINI in the last 168 hours. BNP (last 3 results) No results for input(s): PROBNP in the last 8760 hours. HbA1C: No results for input(s): HGBA1C in the last 72 hours. CBG: Recent Labs  Lab 05/05/23 0816 05/05/23 1151 05/05/23 1706 05/05/23 2104 05/06/23 0750  GLUCAP 121* 163* 201* 146* 175*   Lipid Profile: No results for input(s): CHOL, HDL, LDLCALC, TRIG, CHOLHDL, LDLDIRECT in the last 72 hours. Thyroid Function Tests: No results for input(s): TSH, T4TOTAL, FREET4, T3FREE, THYROIDAB in the last 72 hours. Anemia Panel: No results for input(s): VITAMINB12, FOLATE, FERRITIN, TIBC, IRON , RETICCTPCT in the last 72 hours. Sepsis Labs: No results for input(s): PROCALCITON, LATICACIDVEN in the last 168 hours.  No results found for this or any previous visit (from the past 240 hours).        Radiology Studies: No results found.      Scheduled Meds:  aspirin  EC  81 mg Oral Daily   atorvastatin   80 mg Oral QHS   carvedilol   25 mg Oral BID WC   clopidogrel   75 mg Oral Daily   dapagliflozin  propanediol  10 mg Oral Daily   DULoxetine   20 mg Oral BID   enoxaparin  (LOVENOX ) injection  40 mg Subcutaneous Q24H   folic acid   1 mg Oral Daily   HYDROmorphone   2 mg Oral Q6H   insulin  aspart  0-20 Units Subcutaneous TID WC   insulin  aspart  0-5 Units Subcutaneous QHS   insulin  glargine-yfgn  17 Units Subcutaneous Daily   iron  polysaccharides  150 mg Oral Daily   lactulose   20 g Oral BID   methocarbamol   500 mg Oral TID   multivitamin with minerals  1 tablet Oral Daily   nystatin   5 mL Oral QID   polyethylene glycol  17 g Oral Daily   pregabalin   100 mg Oral BID   Ensure Max Protein  11 oz Oral Daily   sacubitril -valsartan   1  tablet Oral Q12H   spironolactone   50 mg Oral Daily   tamsulosin   0.4 mg Oral QPC supper   thiamine   100 mg Oral Daily   traZODone   100 mg Oral QHS   Continuous Infusions:  cefTRIAXone  (ROCEPHIN )  IV 2 g (05/05/23 1332)   vancomycin  1,500 mg (05/05/23 1417)     LOS: 17 days      Anthony CHRISTELLA Pouch, MD [facility] Pager [phone]  If 7PM-7AM, please contact night-coverage [email or web address] 05/06/2023, 8:46 AM

## 2023-05-06 NOTE — Progress Notes (Signed)
 Occupational Therapy Treatment Patient Details Name: Manuel [name] MRN: [id number] DOB: 18-Jan-1946 Today's Date: 05/06/2023   History of present illness Pt is a 78 y.o. male presenting to hospital 04/19/23 with c/o AMS, weakness, fall, and R leg swelling/pain. Recently seen in ED for fall onto R knee causing effusion and pain; pt s/p R foot TMA 2 months prior. Pt admitted with sepsis d/t cellulitis, AKI superimposed on CKD, hyponatremia, acute metabolic encephalopathy. 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 S/p R knee aspiration 04/20/23. S/p R knee joint I&D 04/21/23 d/t R septic knee. 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   OT comments  Pt pleasant and motivated. Goals updated this date- making good progress. Met goal of bathing sitting EOB with setup and SBA. Pt with good carryover from earlier PT session / edu from medical team and return verbalizes exercises with RLE. Bed mobility performed with CGA, dons shirt with setup sitting EOB and dons underwear minA rolling bed level per pt preference. OT will continue to follow for functional gains. Discharge recommendation remains appropriate. Patient will benefit from continued inpatient follow up therapy, <3 hours/day       If plan is discharge home, recommend the following:  A lot of help with walking and/or transfers;A lot of help with bathing/dressing/bathroom;Assistance with cooking/housework;Assist for transportation;Help with stairs or ramp for entrance;Direct supervision/assist for medications management;Direct supervision/assist for financial management;Supervision due to cognitive status   Equipment Recommendations  BSC/3in1       Precautions / Restrictions Precautions Precautions: Fall Restrictions Weight Bearing Restrictions Per Provider Order: Yes RLE Weight Bearing Per Provider Order: Non weight bearing Other Position/Activity  Restrictions: R BKA       Mobility Bed Mobility Overal bed mobility: Needs Assistance Bed Mobility: Supine to Sit, Rolling Rolling: Contact guard assist   Supine to sit: Contact guard Sit to supine: Contact guard assist        Transfers                         Balance Overall balance assessment: Needs assistance Sitting-balance support: No upper extremity supported, Feet supported Sitting balance-Leahy Scale: Fair Sitting balance - Comments: CGA for ADL tasks LB that required lateral lean for access, otherwise, seated EOB ADL bathing with SBA                                   ADL either performed or assessed with clinical judgement   ADL Overall ADL's : Needs assistance/impaired     Grooming: Wash/dry hands;Wash/dry face;Sitting Grooming Details (indicate cue type and reason): sitting EOB Upper Body Bathing: Set up;Sitting Upper Body Bathing Details (indicate cue type and reason): sitting EOB Lower Body Bathing: Contact guard assist;Sitting/lateral leans Lower Body Bathing Details (indicate cue type and reason): washes L foot, CGA for safety due to lateral lean Upper Body Dressing : Set up;Sitting Upper Body Dressing Details (indicate cue type and reason): dons tshirt Lower Body Dressing: Maximal assistance;Minimal assistance;Bed level;Sitting/lateral leans Lower Body Dressing Details (indicate cue type and reason): dons sock with maxA from OT sitting EOB, minA to don underwear (thread RLE) bed level with pt rolling to pull over hips                      Cognition Arousal: Alert Behavior During Therapy: [facility]  for tasks assessed/performed Overall Cognitive Status: Within Functional Limits for tasks assessed                                 General Comments: pleasant and agreeable to session                   Pertinent Vitals/ Pain       Pain Assessment Pain Assessment: No/denies pain Pain Score: 0-No  pain   Frequency  Min 1X/week        Progress Toward Goals  OT Goals(current goals can now be found in the care plan section)  Progress towards OT goals: Progressing toward goals  Acute Rehab OT Goals OT Goal Formulation: With patient/family Time For Goal Achievement: 05/07/23 Potential to Achieve Goals: Good ADL Goals Pt Will Perform Lower Body Dressing: with modified independence;sitting/lateral leans Pt Will Transfer to Toilet: with supervision;bedside commode;ambulating Pt Will Perform Toileting - Clothing Manipulation and hygiene: sitting/lateral leans;with modified independence Additional ADL Goal #1: Pt will demonstrate independence with desensitization strategies to support residual limb recovery. Additional ADL Goal #2: Pt will complete all aspects of a seated bath with set up and supv for safety.  Plan         AM-PAC OT 6 Clicks Daily Activity     Outcome Measure   Help from another person eating meals?: None Help from another person taking care of personal grooming?: A Little Help from another person toileting, which includes using toliet, bedpan, or urinal?: A Lot Help from another person bathing (including washing, rinsing, drying)?: A Lot Help from another person to put on and taking off regular upper body clothing?: A Little Help from another person to put on and taking off regular lower body clothing?: A Lot 6 Click Score: 16    End of Session    OT Visit Diagnosis: Other abnormalities of gait and mobility (R26.89);Pain   Activity Tolerance Patient tolerated treatment well   Patient Left in bed;with call bell/phone within reach;with bed alarm set   Nurse Communication Mobility status        Time: 8493-8459 OT Time Calculation (min): 34 min  Charges: OT General Charges $OT Visit: 1 Visit OT Treatments $Self Care/Home Management : 23-37 mins  Harjot Zavadil L. Assad Harbeson, OTR/L  05/06/23, 3:56 PM

## 2023-05-07 ENCOUNTER — Encounter: Payer: Self-pay | Admitting: Family Medicine

## 2023-05-07 DIAGNOSIS — Z66 Do not resuscitate: Secondary | ICD-10-CM | POA: Diagnosis not present

## 2023-05-07 DIAGNOSIS — E871 Hypo-osmolality and hyponatremia: Secondary | ICD-10-CM | POA: Diagnosis not present

## 2023-05-07 DIAGNOSIS — Z89511 Acquired absence of right leg below knee: Secondary | ICD-10-CM

## 2023-05-07 DIAGNOSIS — N179 Acute kidney failure, unspecified: Secondary | ICD-10-CM | POA: Diagnosis not present

## 2023-05-07 DIAGNOSIS — M009 Pyogenic arthritis, unspecified: Secondary | ICD-10-CM | POA: Diagnosis not present

## 2023-05-07 DIAGNOSIS — M86071 Acute hematogenous osteomyelitis, right ankle and foot: Secondary | ICD-10-CM

## 2023-05-07 DIAGNOSIS — I10 Essential (primary) hypertension: Secondary | ICD-10-CM

## 2023-05-07 DIAGNOSIS — T148XXA Other injury of unspecified body region, initial encounter: Secondary | ICD-10-CM | POA: Diagnosis not present

## 2023-05-07 LAB — BASIC METABOLIC PANEL
Anion gap: 8 (ref 5–15)
BUN: 16 mg/dL (ref 8–23)
CO2: 24 mmol/L (ref 22–32)
Calcium: 8.8 mg/dL — ABNORMAL LOW (ref 8.9–10.3)
Chloride: 101 mmol/L (ref 98–111)
Creatinine, Ser: 0.86 mg/dL (ref 0.61–1.24)
GFR, Estimated: >60 mL/min (ref >60)
Glucose, Bld: 107 mg/dL — ABNORMAL HIGH (ref 70–99)
Potassium: 5.2 mmol/L — ABNORMAL HIGH (ref 3.5–5.1)
Sodium: 133 mmol/L — ABNORMAL LOW (ref 135–145)

## 2023-05-07 LAB — CBC
HCT: 26 % — ABNORMAL LOW (ref 39.0–52.0)
Hemoglobin: 8.3 g/dL — ABNORMAL LOW (ref 13.0–17.0)
MCH: 26.4 pg (ref 26.0–34.0)
MCHC: 31.9 g/dL (ref 30.0–36.0)
MCV: 82.8 fL (ref 80.0–100.0)
Platelets: 478 10*3/uL — ABNORMAL HIGH (ref 150–400)
RBC: 3.14 MIL/uL — ABNORMAL LOW (ref 4.22–5.81)
RDW: 15.5 % (ref 11.5–15.5)
WBC: 5.6 10*3/uL (ref 4.0–10.5)
nRBC: 0 % (ref 0.0–0.2)

## 2023-05-07 LAB — GLUCOSE, CAPILLARY
Glucose-Capillary: 133 mg/dL — ABNORMAL HIGH (ref 70–99)
Glucose-Capillary: 121 mg/dL — ABNORMAL HIGH (ref 70–99)
Glucose-Capillary: 114 mg/dL — ABNORMAL HIGH (ref 70–99)
Glucose-Capillary: 135 mg/dL — ABNORMAL HIGH (ref 70–99)

## 2023-05-07 LAB — SEDIMENTATION RATE: Sed Rate: 65 mm/h — ABNORMAL HIGH (ref 0–20)

## 2023-05-07 LAB — C-REACTIVE PROTEIN: CRP: 4.8 mg/dL — ABNORMAL HIGH (ref <1.0)

## 2023-05-07 MED ORDER — SPIRONOLACTONE 25 MG PO TABS
50.0000 mg | ORAL_TABLET | Freq: Every day | ORAL | Status: DC
  Administered 2023-05-08: 50 mg via ORAL
  Filled 2023-05-07: qty 2

## 2023-05-07 MED ORDER — TRAZODONE HCL 100 MG PO TABS
100.0000 mg | ORAL_TABLET | Freq: Every day | ORAL | Status: DC
  Administered 2023-05-07: 100 mg via ORAL
  Filled 2023-05-07: qty 1

## 2023-05-07 NOTE — Assessment & Plan Note (Signed)
 05-07-2023 stable. Psych has signed off his case. Remains on trazodone  100 mg at bedtime, and cymbalta  DR 20 mg bid.

## 2023-05-07 NOTE — Assessment & Plan Note (Signed)
 Stable

## 2023-05-07 NOTE — Subjective & Objective (Addendum)
Pt seen and examined. In good spirits. No complaints. Met with pt's wife [name] at bedside. Pt has insurance authorization and SNF bed ready for today.

## 2023-05-07 NOTE — Assessment & Plan Note (Signed)
 05-07-2023 resolved now that he has had right BKA

## 2023-05-07 NOTE — Progress Notes (Signed)
   Peer-to-peer @ 11:37 AM on 05-07-2023 Medical director refuses to give me her name. Has preliminary given approval for SNF.  Unk Garb, DO [facility]

## 2023-05-07 NOTE — Assessment & Plan Note (Addendum)
05-07-2023 on GDMT with aldactone 50 mg qdaily, entresto 24-26 bid, coreg 25 mg bid, farxiga 10 mg daily 05-08-2023 stable.

## 2023-05-07 NOTE — Assessment & Plan Note (Addendum)
Pt made DNR/DNI on admission.

## 2023-05-07 NOTE — Plan of Care (Signed)
  Problem: Clinical Measurements: Goal: Signs and symptoms of infection will decrease Outcome: Progressing   Problem: Coping: Goal: Ability to adjust to condition or change in health will improve Outcome: Progressing   Problem: Metabolic: Goal: Ability to maintain appropriate glucose levels will improve Outcome: Progressing   Problem: Nutritional: Goal: Maintenance of adequate nutrition will improve Outcome: Progressing   Problem: Clinical Measurements: Goal: Diagnostic test results will improve Outcome: Progressing   Problem: Coping: Goal: Level of anxiety will decrease Outcome: Progressing

## 2023-05-07 NOTE — Progress Notes (Addendum)
 PROGRESS NOTE    KEYSHAUN [name]  ZOX:[id number] DOB: 12/27/1945 DOA: 04/18/2023 PCP: Little Riff, MD  Subjective: Pt seen and examined. In good spirits. No complaints.   Hospital Course: HPI: Manuel [name] is a 79 y.o. Caucasian male with medical history significant for osteoarthritis, coronary artery disease, essential hypertension, carotid artery disease, DDD, type 2 diabetes mellitus, gout, dyslipidemia and ischemic cardiomyopathy as well as OSA and peripheral neuropathy, who presented to the emergency room with acute onset of altered mental status with confusion.  He had a right foot transmetatarsal amputation by Dr. Vonna Guardian this summer.  Since then he has had an ulcer which has been draining.  He admitted to associated right foot pain with swelling and erythema.  He was seen in the ER for mechanical fall 4 days ago and was given p.o. Bactrim  and Keflex  which he has been taking but missed Christmas Day.  He was having mild AKI then.  He admitted to diminish fluid intake likely.   Brief Hospital course:  78yo with h/o CAD, HTN, DM, and chronic systolic CHF who presented on 81/19 with AMS and wound infection s/p recent I&D of R groin wound and prior R 4th/5th partial ray amputation (7/29).  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  He then underwent R knee I&D for septic/gouty arthritis on 12/30.  He will need SNF rehab.  Vascular surgery is recommending ASA 81 mg daily + Plavix  and Lipitor .  ID is consulting for antibiotic recommendations.  Significant Events: Admitted 04/18/2023 for sepsis due to cellulitis of his right LE stump   Significant Labs: hyponatremia 123 and hypochloremia of 90 with a CO2 of 21 blood glucose of 249, BUN of 71 and creatinine 1.98 up from 48 and 1.51 on 12/24 and albumin 3.2 with AST 55 and ALT 48. CBC showed leukocytosis 16.5 and anemia. Procalcitonin was elevated 2.13. Lactic acid was 1.1. UA showed more than 500 glucose.   Significant  Imaging Studies: Right lower extremity ultrasound showed no DVT.   Antibiotic Therapy: Anti-infectives (From admission, onward)    Start     Dose/Rate Route Frequency Ordered Stop   05/07/23 1000  cefadroxil  (DURICEF) capsule 1,000 mg        1,000 mg Oral 2 times daily 05/06/23 0902 05/21/23 0959   05/07/23 0800  doxycycline  (VIBRA -TABS) tablet 100 mg        100 mg Oral Every 12 hours 05/06/23 0902 05/21/23 0759   05/03/23 1400  vancomycin  (VANCOREADY) IVPB 1500 mg/300 mL        1,500 mg 150 mL/hr over 120 Minutes Intravenous Every 24 hours 05/02/23 0841 05/06/23 1822   04/26/23 1400  cefTRIAXone  (ROCEPHIN ) 2 g in sodium chloride  0.9 % 100 mL IVPB        2 g 200 mL/hr over 30 Minutes Intravenous Every 24 hours 04/25/23 1720 05/06/23 1650   04/26/23 0600  vancomycin  (VANCOREADY) IVPB 2000 mg/400 mL  Status:  Discontinued        2,000 mg 200 mL/hr over 120 Minutes Intravenous Every 24 hours 04/25/23 1720 05/02/23 0841   04/24/23 0600  vancomycin  (VANCOREADY) IVPB 2000 mg/400 mL        2,000 mg 200 mL/hr over 120 Minutes Intravenous Every 24 hours 04/23/23 1314 04/25/23 0728   04/23/23 0830  vancomycin  (VANCOREADY) IVPB 1500 mg/300 mL  Status:  Discontinued        1,500 mg 150 mL/hr over 120 Minutes Intravenous Every 24  hours 04/22/23 0957 04/23/23 1314   04/20/23 0830  vancomycin  (VANCOREADY) IVPB 1250 mg/250 mL  Status:  Discontinued        1,250 mg 166.7 mL/hr over 90 Minutes Intravenous Every 24 hours 04/20/23 0750 04/22/23 0957   04/20/23 0800  vancomycin  (VANCOCIN ) IVPB 1000 mg/200 mL premix  Status:  Discontinued        1,000 mg 200 mL/hr over 60 Minutes Intravenous Every 24 hours 04/19/23 1440 04/20/23 0750   04/19/23 1400  cefTRIAXone  (ROCEPHIN ) 2 g in sodium chloride  0.9 % 100 mL IVPB        2 g 200 mL/hr over 30 Minutes Intravenous Every 24 hours 04/19/23 0228 04/25/23 1454   04/19/23 0800  vancomycin  (VANCOREADY) IVPB 750 mg/150 mL  Status:  Discontinued        750  mg 150 mL/hr over 60 Minutes Intravenous Every 24 hours 04/19/23 0237 04/19/23 1440   04/19/23 0230  vancomycin  (VANCOCIN ) IVPB 1000 mg/200 mL premix  Status:  Discontinued        1,000 mg 200 mL/hr over 60 Minutes Intravenous  Once 04/19/23 0228 04/19/23 0230   04/19/23 0130  ceFEPIme  (MAXIPIME ) 2 g in sodium chloride  0.9 % 100 mL IVPB        2 g 200 mL/hr over 30 Minutes Intravenous  Once 04/19/23 0121 04/19/23 0240   04/19/23 0130  Vancomycin  (VANCOCIN ) 1,500 mg in sodium chloride  0.9 % 500 mL IVPB        1,500 mg 250 mL/hr over 120 Minutes Intravenous  Once 04/19/23 0121 04/19/23 0438       Procedures: 04-20-2023 right BKA 04-21-2023 irrigation/debridement right knee  Consultants: Psych Orthopedics Infectious Disease    Assessment and Plan: * Osteomyelitis (HCC) - right foot 05-07-2023 has been resolved after he had his right BKA on 04-20-2023.  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) 05-07-2023 pt completed 14 days of IV Rocephin  2 gram every day and IV vanco on 05-06-2023. Pt changed to 2 weeks of duricef 1000 mg bid and doxycycline  100 gm bid x 14 days per ID recs.  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  Wound infection 05-07-2023 resolved now that he has had right BKA  Diabetic infection of right foot (HCC) 05-07-2023 resolved now that he has had right BKA  Acute metabolic encephalopathy Resolved. Was thought to be due to sepsis, AKI and hyponatremia.  BPH (benign prostatic hyperplasia) 05-07-2023 continue Flomax .  Essential hypertension 05-07-2023 stable. On aldactone  50 mg qdaily, entresto  24-26 bid, coreg  25 mg bid, farxiga  10 mg daily  Hyponatremia Chronic. Na 133. Stable. Asymptomatic. TSH normal @ 1.4, AM cortisol normal 13.1.  normal serum osm of 295, urine Na 18, urine osm 201.  BUN was elevated at 71. Looks more like non-hypotonic hyponatremia. Possibly due to uremia from elevated BUN.  BUN is now 16, and Na of  133.  Acute kidney injury superimposed on chronic kidney disease (HCC) Resolved with IVF.  Chronic combined systolic and diastolic CHF (congestive heart failure) (HCC) 05-07-2023 on GDMT with aldactone  50 mg qdaily, entresto  24-26 bid, coreg  25 mg bid, farxiga  10 mg daily  Lower limb ischemia right leg with history of angioplasty and stent on 05/14/2021 05-07-2023 resolved now that he has had right BKA  Mixed hyperlipidemia 05-07-2023 remains on lipitor  80 mg qday.  Diabetes mellitus type 2, insulin  dependent (HCC) 05-06-2022 remains on lantus  17 unit qday, farxiga  10 mg daily, and SSI.  Coronary artery disease with  history of myocardial infarction without history of CABG 05-07-2023 stable. On ASA 81 mg qday, lipitor  80 mg qday, coreg  25 mg bid.  Uncontrolled type 2 diabetes mellitus with hyperglycemia, with long-term current use of insulin  (HCC)-resolved as of 05/07/2023 - The patient will be placed on supplemental coverage with NovoLog . - We will continue basal coverage.  DNR (do not resuscitate)/DNI(Do Not Intubate( Pt made DNR/DNI on admission.  Current severe episode of major depressive disorder without psychotic features (HCC) 05-07-2023 stable. Psych has signed off his case. Remains on trazodone  100 mg at bedtime, and cymbalta  DR 20 mg bid.  Dyslipidemia - We will continue statin therapy   DVT prophylaxis: enoxaparin  (LOVENOX ) injection 40 mg Start: 04/19/23 1000    Code Status: Limited: Do not attempt resuscitation (DNR) -DNR-LIMITED -Do Not Intubate/DNI  Family Communication: no family at bedside Disposition Plan: SNF Reason for continuing need for hospitalization: medically stable for DC  Objective: Vitals:   05/05/23 2153 05/06/23 0859 05/06/23 2354 05/07/23 0815  BP: (!) 150/78 130/72 127/61 133/71  Pulse: 75 71 61 76  Resp: 18 16 18 16   Temp: 98 F (36.7 C) 98.6 F (37 C) 98 F (36.7 C) 98.4 F (36.9 C)  TempSrc: Oral  Oral Oral  SpO2: 94% 92% 94% 98%   Weight:      Height:        Intake/Output Summary (Last 24 hours) at 05/07/2023 1208 Last data filed at 05/07/2023 1105 Gross per 24 hour  Intake --  Output 2250 ml  Net -2250 ml   Filed Weights   04/18/23 1755  Weight: 83.9 kg    Examination:  Physical Exam Vitals and nursing note reviewed.  Constitutional:      General: He is not in acute distress.    Appearance: He is not toxic-appearing.  HENT:     Head: Normocephalic and atraumatic.     Nose: Nose normal.  Cardiovascular:     Rate and Rhythm: Normal rate and regular rhythm.  Pulmonary:     Effort: Pulmonary effort is normal.     Breath sounds: Normal breath sounds.  Abdominal:     General: Bowel sounds are normal.     Palpations: Abdomen is soft.  Musculoskeletal:     Comments: Right BKA  Skin:    General: Skin is warm and dry.     Capillary Refill: Capillary refill takes less than 2 seconds.  Neurological:     Mental Status: He is alert and oriented to person, place, and time.     Data Reviewed: I have personally reviewed following labs and imaging studies  CBC: Recent Labs  Lab 05/03/23 0106 05/04/23 0432 05/05/23 0437 05/06/23 0602 05/07/23 0326  WBC 7.5 6.3 5.7 5.5 5.6  HGB 7.2* 7.6* 7.7* 8.0* 8.3*  HCT 22.4* 23.0* 23.3* 24.9* 26.0*  MCV 85.2 83.9 82.9 84.1 82.8  PLT 519* 489* 478* 456* 478*   Basic Metabolic Panel: Recent Labs  Lab 05/03/23 0106 05/04/23 0432 05/05/23 0437 05/06/23 0602 05/07/23 0326  NA 132* 132* 132* 135 133*  K 5.0 4.6 4.6 4.5 5.2*  CL 103 102 101 102 101  CO2 23 22 23 23 24   GLUCOSE 183* 136* 134* 162* 107*  BUN 18 19 21 16 16   CREATININE 0.78 0.70 0.74 0.88 0.86  CALCIUM  8.4* 8.6* 8.5* 8.8* 8.8*   GFR: Estimated Creatinine Clearance: 73.5 mL/min (by C-G formula based on SCr of 0.86 mg/dL). Liver Function Tests: Recent Labs  Lab 05/06/23 1847  AST 14*  ALT 22  ALKPHOS 85  BILITOT 0.5  PROT 6.3*  ALBUMIN 2.1*   BNP (last 3 results) Recent Labs     11/13/22 0451 01/15/23 0754 02/14/23 1720  BNP 932.6* 1,760.5* 445.2*   CBG: Recent Labs  Lab 05/06/23 0750 05/06/23 1140 05/06/23 1657 05/06/23 2113 05/07/23 0742  GLUCAP 175* 200* 136* 135* 133*   Scheduled Meds:  aspirin  EC  81 mg Oral Daily   atorvastatin   80 mg Oral QHS   carvedilol   25 mg Oral BID WC   cefadroxil   1,000 mg Oral BID   clopidogrel   75 mg Oral Daily   dapagliflozin  propanediol  10 mg Oral Daily   doxycycline   100 mg Oral Q12H   DULoxetine   20 mg Oral BID   enoxaparin  (LOVENOX ) injection  40 mg Subcutaneous Q24H   folic acid   1 mg Oral Daily   HYDROmorphone   2 mg Oral Q6H   insulin  aspart  0-20 Units Subcutaneous TID WC   insulin  aspart  0-5 Units Subcutaneous QHS   insulin  glargine-yfgn  17 Units Subcutaneous Daily   iron  polysaccharides  150 mg Oral Daily   methocarbamol   500 mg Oral TID   multivitamin with minerals  1 tablet Oral Daily   nystatin   5 mL Oral QID   polyethylene glycol  17 g Oral Daily   pregabalin   100 mg Oral BID   Ensure Max Protein  11 oz Oral Daily   sacubitril -valsartan   1 tablet Oral Q12H   [START ON 05/08/2023] spironolactone   50 mg Oral Daily   tamsulosin   0.4 mg Oral QPC supper   thiamine   100 mg Oral Daily   traZODone   100 mg Oral QHS   Continuous Infusions:   LOS: 18 days   Time spent: 40 minutes  Unk Garb, DO  [facility]  05/07/2023, 12:08 PM

## 2023-05-07 NOTE — Assessment & Plan Note (Addendum)
05-06-2022 remains on lantus 17 unit qday, farxiga 10 mg daily, and SSI.  05-08-2023 stable.

## 2023-05-07 NOTE — Assessment & Plan Note (Addendum)
05-07-2023 pt completed 14 days of IV Rocephin 2 gram every day and IV vanco on 05-06-2023. Pt changed to 2 weeks of duricef 1000 mg bid and doxycycline 100 gm bid x 14 days per ID recs.  05-08-2023 DC to SNF on Duricef 1000 mg bid x 2 weeks and doxycycline 100 mg bid x 2 weeks.

## 2023-05-07 NOTE — Assessment & Plan Note (Addendum)
05-07-2023 stable. On ASA 81 mg qday, lipitor 80 mg qday, coreg 25 mg bid.  05-08-2023 stable.

## 2023-05-07 NOTE — Progress Notes (Signed)
 Physical Therapy Treatment Patient Details Name: Manuel [name] MRN: [id number] DOB: [birth year] Today's Date: 05/07/2023   History of Present Illness Pt is a 78 y.o. male presenting to hospital 04/19/23 with c/o AMS, weakness, fall, and R leg swelling/pain. Recently seen in ED for fall onto R knee causing effusion and pain; pt s/p R foot TMA 2 months prior. Pt admitted with sepsis d/t cellulitis, AKI superimposed on CKD, hyponatremia, acute metabolic encephalopathy. 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 S/p R knee aspiration 04/20/23. S/p R knee joint I&D 04/21/23 d/t R septic knee. 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    PT Comments  Pt is making gradual progress towards goals. Pt is currently on bed pan with very trace BM. Able to roll to B sides and needs assist for hygiene. Pt then able to sit at EOB to perform stretches and re-wrap residual limb. While sitting, needed to return to bed to finish BM. Gave pt back the bed pan. Will continue to progress as able. Encouraged pt to get to chair later this date with staff assistance.   If plan is discharge home, recommend the following: Assistance with cooking/housework;Direct supervision/assist for medications management;Direct supervision/assist for financial management;Assist for transportation;Help with stairs or ramp for entrance;Supervision due to cognitive status;Two people to help with walking and/or transfers;Two people to help with bathing/dressing/bathroom   Can travel by private vehicle     No  Equipment Recommendations  Other (comment)    Recommendations for Other Services       Precautions / Restrictions Precautions Precautions: Fall Restrictions Weight Bearing Restrictions Per Provider Order: Yes RLE Weight Bearing Per Provider Order: Non weight bearing Other Position/Activity Restrictions: R BKA     Mobility  Bed  Mobility Overal bed mobility: Needs Assistance Bed Mobility: Supine to Sit, Rolling Rolling: Supervision   Supine to sit: Supervision Sit to supine: Supervision   General bed mobility comments: improved technique this date. Able to roll to B sides for hygiene and then perform supine<>sit with supervision.    Transfers Overall transfer level: Needs assistance Equipment used: None              Lateral/Scoot Transfers: Min assist General transfer comment: once seated, able to kick residual limb and re-wrap with ACE wrap. Also able to perform lateral scoots up towards [facility] with min assist. Pt unable to transfer to recliner due to urgent BM request. Once back in bed, gave him the bed pan.    Ambulation/Gait               General Gait Details: unable   Stairs             Wheelchair Mobility     Tilt Bed    Modified Rankin (Stroke Patients Only)       Balance Overall balance assessment: Needs assistance Sitting-balance support: No upper extremity supported, Feet supported Sitting balance-Leahy Scale: Fair                                      Cognition Arousal: Alert Behavior During Therapy: WFL for tasks assessed/performed Overall Cognitive Status: Within Functional Limits for tasks assessed  General Comments: pleasant and agreeable to session, although limited by [facility]        Exercises Total Joint Exercises Goniometric ROM: R knee AROM: 11-75 degrees Other Exercises Other Exercises: Reinforced stretching with towel roll and working on ROM for knee.    General Comments        Pertinent Vitals/Pain Pain Assessment Pain Assessment: No/denies pain    Home Living                          Prior Function            PT Goals (current goals can now be found in the care plan section) Acute Rehab PT Goals Patient Stated Goal: to feel better and have less pain PT Goal  Formulation: With patient/family Time For Goal Achievement: 05/20/23 Potential to Achieve Goals: Fair Progress towards PT goals: Progressing toward goals    Frequency    Min 1X/week      PT Plan      Co-evaluation              AM-PAC PT "6 Clicks" Mobility   Outcome Measure  Help needed turning from your back to your side while in a flat bed without using bedrails?: A Little Help needed moving from lying on your back to sitting on the side of a flat bed without using bedrails?: A Little Help needed moving to and from a bed to a chair (including a wheelchair)?: A Lot Help needed standing up from a chair using your arms (e.g., wheelchair or bedside chair)?: A Lot Help needed to walk in hospital room?: Total Help needed climbing 3-5 steps with a railing? : Total 6 Click Score: 12    End of Session   Activity Tolerance: Patient tolerated treatment well Patient left: in bed;with bed alarm set Nurse Communication: Mobility status;Precautions;Other (comment) PT Visit Diagnosis: Other abnormalities of gait and mobility (R26.89);Muscle weakness (generalized) (M62.81);History of falling (Z91.81);Pain Pain - Right/Left: Right Pain - part of body: Knee;Leg     Time: 7829-5621 PT Time Calculation (min) (ACUTE ONLY): 25 min  Charges:    $Therapeutic Exercise: 8-22 mins $Therapeutic Activity: 8-22 mins PT General Charges $$ ACUTE PT VISIT: 1 Visit                     Amparo Balk, PT, DPT, GCS [phone]    Fantasia Jinkins 05/07/2023, 1:38 PM

## 2023-05-07 NOTE — Assessment & Plan Note (Addendum)
05-07-2023 remains on lipitor 80 mg qday. 05-08-2023 stable.

## 2023-05-07 NOTE — Plan of Care (Signed)

## 2023-05-07 NOTE — Progress Notes (Signed)
 Palliative Care Progress Note, Assessment & Plan   Patient Name: Manuel [name]       Date: 05/07/2023 DOB: 1945-11-21  Age: 78 y.o. MRN#: [id number] Attending Physician: Unk Garb, DO Primary Care Physician: Little Riff, MD Admit Date: 04/18/2023  Subjective: Patient is lying in bed, sleeping, but easily awakens to my presence.  He is alert and oriented x 4.  He acknowledges my presence and is able to make his wishes known.  No family or friends present during my visit.  HPI: 78 y.o. male  with past medical history of CAD s/p PCI mRCA (3 overlapping Xience stents 2011), extensive PAD s/p PTA L common Iliac, R peroneal, R distal SFA/prox popliteal, and stent to R distal SFA/prox popliteal arteries (05/14/21), HFmrEF (40-45% 2020), HTN, HLD, DM2 depression, alcohol abuse  admitted on 04/18/2023 with AMS and wound infection s/p recent I&D of right groin wound and prior right fourth/fifth partial ray amputation.   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  He then underwent right knee I&D for septic/gouty arthritis on 12/30.   Vascular surgery is recommending aspirin  daily with Plavix  and Lipitor .   ID is consulted for antibiotic recommendations.   PMT was consulted to discuss boundaries and goals of care.   Of note, Mr. Baumel familiar to PMT services as he has been followed closely during previous hospitalizations.  Summary of counseling/coordination of care: Extensive chart review completed prior to meeting patient including labs, vital signs, imaging, progress notes, orders, and available advanced directive documents from current and previous encounters.   After reviewing the patient's chart and assessing the patient at bedside, I spoke with patient in regards to symptom  management and goals of care.   Symptoms assessed.  Patient endorses that his pain is well-controlled.  However, he remains very sleepy throughout the day.  We discussed his trazodone  is giving rather significantly late at night.  I have adjusted to be given earlier in hopes that this will avoid daytime sleepiness.  He is in agreement and appreciative of this adjustment.  No other adjustment to [facility] needed at this time.  Discussed that patient is being evaluated by insurance for potential transfer to rehab.  In light of patient moving to a different medical facility, introduced the concept of a MOST form.  MOST form provided for patient to review.  Discussed importance advance care planning documentation to outline patient's goals and wishes as he moves to other facilities.  He shares appreciation and will review the paperwork.  I discussed that I am happy to complete this with him but that any DO/PA/MD/NP can complete it with him either in the hospital or in other medical facilities.  No adjustment to plan of care at this time.  PMT will continue to support patient throughout his hospitalization.  Physical Exam Vitals reviewed.  Constitutional:      General: He is not in acute distress.    Appearance: He is normal weight.  HENT:     Head: Normocephalic.     Nose: Nose normal.     Mouth/Throat:     Mouth: Mucous membranes are moist.  Eyes:     Pupils: Pupils  are equal, round, and reactive to light.  Cardiovascular:     Rate and Rhythm: Normal rate.  Pulmonary:     Effort: Pulmonary effort is normal.  Abdominal:     Palpations: Abdomen is soft.  Neurological:     Mental Status: He is alert and oriented to person, place, and time.  Psychiatric:        Mood and Affect: Mood normal.        Behavior: Behavior normal.        Thought Content: Thought content normal.        Judgment: Judgment normal.             Total Time 35 minutes   Time spent includes: Detailed review of medical  records (labs, imaging, vital signs), medically appropriate exam (mental status, respiratory, cardiac, skin), discussed with treatment team, counseling and educating patient, family and staff, documenting clinical information, medication management and coordination of care.  Judeen Nose L. Rebbeca Campi, DNP, FNP-BC Palliative Medicine Team

## 2023-05-07 NOTE — TOC Progression Note (Signed)
 Transition of Care [facility]) - Progression Note    Patient Details  Name: Manuel [name] MRN: [id number] Date of Birth: 1945-05-13  Transition of Care [facility] CM/SW Contact  Abagail Hoar, RN Phone Number: 05/07/2023, 8:53 AM  Clinical Narrative:     A Peer to Peer review is requested by 1pm today. the number is [phone] opt. 5. They will need pts name DOB and policy number [id number] when calling. , I sent a secure message for the physician and notified of the Peer to peer       Expected Discharge Plan and Services                                               Social Determinants of Health (SDOH) Interventions SDOH Screenings   Food Insecurity: No Food Insecurity (04/19/2023)  Housing: Low Risk  (04/19/2023)  Transportation Needs: No Transportation Needs (04/19/2023)  Utilities: Not At Risk (04/19/2023)  Financial Resource Strain: Patient Unable To Answer (02/20/2023)   Received from [facility]  Social Connections: Unknown (04/22/2023)  Tobacco Use: Low Risk  (04/21/2023)  Recent Concern: Tobacco Use - Medium Risk (01/28/2023)   Received from [facility]    Readmission Risk Interventions    02/18/2023   11:58 AM 02/15/2023    4:10 PM  Readmission Risk Prevention Plan  Transportation Screening  Complete  PCP or Specialist Appt within 3-5 Days Complete Complete  HRI or Home Care Consult Complete Complete  Social Work Consult for Recovery Care Planning/Counseling Complete Complete  Palliative Care Screening Not Applicable Not Applicable  Medication Review Oceanographer) Complete Complete

## 2023-05-08 ENCOUNTER — Encounter (INDEPENDENT_AMBULATORY_CARE_PROVIDER_SITE_OTHER): Payer: Medicare Other

## 2023-05-08 ENCOUNTER — Ambulatory Visit (INDEPENDENT_AMBULATORY_CARE_PROVIDER_SITE_OTHER): Payer: Medicare Other | Admitting: Nurse Practitioner

## 2023-05-08 DIAGNOSIS — G9341 Metabolic encephalopathy: Secondary | ICD-10-CM | POA: Diagnosis not present

## 2023-05-08 DIAGNOSIS — M86071 Acute hematogenous osteomyelitis, right ankle and foot: Secondary | ICD-10-CM | POA: Diagnosis not present

## 2023-05-08 DIAGNOSIS — N179 Acute kidney failure, unspecified: Secondary | ICD-10-CM | POA: Diagnosis not present

## 2023-05-08 DIAGNOSIS — E119 Type 2 diabetes mellitus without complications: Secondary | ICD-10-CM

## 2023-05-08 DIAGNOSIS — M009 Pyogenic arthritis, unspecified: Secondary | ICD-10-CM | POA: Diagnosis not present

## 2023-05-08 LAB — GLUCOSE, CAPILLARY
Glucose-Capillary: 99 mg/dL (ref 70–99)
Glucose-Capillary: 186 mg/dL — ABNORMAL HIGH (ref 70–99)

## 2023-05-08 LAB — BASIC METABOLIC PANEL
Anion gap: 6 (ref 5–15)
BUN: 17 mg/dL (ref 8–23)
CO2: 23 mmol/L (ref 22–32)
Calcium: 8.6 mg/dL — ABNORMAL LOW (ref 8.9–10.3)
Chloride: 102 mmol/L (ref 98–111)
Creatinine, Ser: 0.74 mg/dL (ref 0.61–1.24)
GFR, Estimated: >60 mL/min (ref >60)
Glucose, Bld: 104 mg/dL — ABNORMAL HIGH (ref 70–99)
Potassium: 4.4 mmol/L (ref 3.5–5.1)
Sodium: 131 mmol/L — ABNORMAL LOW (ref 135–145)

## 2023-05-08 MED ORDER — INSULIN ASPART 100 UNIT/ML IJ SOLN: 0.0000 [IU] | Freq: Three times a day (TID) | INTRAMUSCULAR | Status: DC

## 2023-05-08 MED ORDER — DOXYCYCLINE HYCLATE 100 MG PO TABS: 100.0000 mg | ORAL_TABLET | Freq: Two times a day (BID) | ORAL | Status: AC

## 2023-05-08 MED ORDER — TRAZODONE HCL 100 MG PO TABS: 100.0000 mg | ORAL_TABLET | Freq: Every day | ORAL | Status: DC

## 2023-05-08 MED ORDER — CEFADROXIL 500 MG PO CAPS: 1000.0000 mg | ORAL_CAPSULE | Freq: Two times a day (BID) | ORAL | Status: AC

## 2023-05-08 MED ORDER — PREGABALIN 75 MG PO CAPS: 75.0000 mg | ORAL_CAPSULE | Freq: Two times a day (BID) | ORAL | 0 refills | Status: DC

## 2023-05-08 MED ORDER — TAMSULOSIN HCL 0.4 MG PO CAPS: 0.4000 mg | ORAL_CAPSULE | Freq: Every day | ORAL | Status: DC

## 2023-05-08 MED ORDER — ACETAMINOPHEN 325 MG PO TABS: 650.0000 mg | ORAL_TABLET | Freq: Four times a day (QID) | ORAL | Status: DC | PRN

## 2023-05-08 MED ORDER — ONDANSETRON HCL 4 MG PO TABS: 4.0000 mg | ORAL_TABLET | Freq: Four times a day (QID) | ORAL | Status: DC | PRN

## 2023-05-08 MED ORDER — HYDROMORPHONE HCL 2 MG PO TABS: 2.0000 mg | ORAL_TABLET | Freq: Four times a day (QID) | ORAL | 0 refills | Status: AC | PRN

## 2023-05-08 MED ORDER — INSULIN GLARGINE-YFGN 100 UNIT/ML ~~LOC~~ SOLN: 17.0000 [IU] | Freq: Every day | SUBCUTANEOUS | Status: DC

## 2023-05-08 MED ORDER — METHOCARBAMOL 500 MG PO TABS: 500.0000 mg | ORAL_TABLET | Freq: Three times a day (TID) | ORAL | Status: AC | PRN

## 2023-05-08 MED ORDER — INSULIN ASPART 100 UNIT/ML IJ SOLN: 0.0000 [IU] | Freq: Every day | INTRAMUSCULAR | Status: DC

## 2023-05-08 MED ORDER — DULOXETINE HCL 20 MG PO CPEP: 20.0000 mg | ORAL_CAPSULE | Freq: Two times a day (BID) | ORAL | Status: DC

## 2023-05-08 NOTE — TOC Progression Note (Signed)
Transition of Care [facility]) - Progression Note    Patient Details  Name: Manuel [name] MRN: [id number] Date of Birth: 11/28/1945  Transition [facility]) CM/SW Contact  Marlowe Sax, RN Phone Number: 05/08/2023, 2:58 PM  Clinical Narrative:    The patient is discharging to go to [facility] room 802, his wife is in the room and aware, EMS called and he is 4th on their transport list   Expected Discharge Plan: Skilled Nursing Facility Barriers to Discharge: English as a second language teacher  Expected Discharge Plan and Services   Discharge Planning Services: CM Consult   Living arrangements for the past 2 months: Single Family Home Expected Discharge Date: 05/08/23                                     Social Determinants of Health (SDOH) Interventions SDOH Screenings   Food Insecurity: No Food Insecurity (04/19/2023)  Housing: Low Risk  (04/19/2023)  Transportation Needs: No Transportation Needs (04/19/2023)  Utilities: Not At Risk (04/19/2023)  Financial Resource Strain: Patient Unable To Answer (02/20/2023)   Received from [facility]  Social Connections: Unknown (04/22/2023)  Tobacco Use: Low Risk  (04/21/2023)  Recent Concern: Tobacco Use - Medium Risk (01/28/2023)   Received from [facility]    Readmission Risk Interventions    02/18/2023   11:58 AM 02/15/2023    4:10 PM  Readmission Risk Prevention Plan  Transportation Screening  Complete  PCP or Specialist Appt within 3-5 Days Complete Complete  HRI or Home Care Consult Complete Complete  Social Work Consult for Recovery Care Planning/Counseling Complete Complete  Palliative Care Screening Not Applicable Not Applicable  Medication Review Oceanographer) Complete Complete

## 2023-05-08 NOTE — Discharge Summary (Signed)
[facility] Physician Discharge Summary   Patient name: Manuel [name]  Admit date:     04/18/2023  Discharge date: 05/08/2023  Attending Physician: Hannah Beat [[id number]]  Discharge Physician: Carollee Herter   PCP: Gracelyn Nurse, MD  Admitted From: Home  Disposition:   [facility]  Recommendations for Outpatient Follow-up:  Follow up with PCP in 1-2 weeks Follow up with Vein and Vascular in 1 week for staple removal.  Home Health:No Equipment/Devices: None  Discharge Condition:Stable CODE STATUS:DNR/DNI Diet recommendation: Diabetic Fluid Restriction: None  Hospital Summary: HPI: Manuel [name] is a 78 y.o. Caucasian male with medical history significant for osteoarthritis, coronary artery disease, essential hypertension, carotid artery disease, DDD, type 2 diabetes mellitus, gout, dyslipidemia and ischemic cardiomyopathy as well as OSA and peripheral neuropathy, who presented to the emergency room with acute onset of altered mental status with confusion.  He had a right foot transmetatarsal amputation by Dr. Wyn Quaker this summer.  Since then he has had an ulcer which has been draining.  He admitted to associated right foot pain with swelling and erythema.  He was seen in the ER for mechanical fall 4 days ago and was given p.o. Bactrim and Keflex which he has been taking but missed Christmas Day.  He was having mild AKI then.  He admitted to diminish fluid intake likely.   Brief Hospital course:  78yo with h/o CAD, HTN, DM, and chronic systolic CHF who presented on 30/86 with AMS and wound infection s/p recent I&D of R groin wound and prior R 4th/5th partial ray amputation (7/29).  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  He then underwent R knee I&D for septic/gouty arthritis on 12/30.  He will need SNF rehab.  Vascular surgery is recommending ASA 81 mg daily + Plavix and Lipitor.  ID is consulting for antibiotic recommendations.  As per Dr. Mayford Knife  1/8-1/14/24: Pt has remained medically stable this week. Psych re-evaluated the pt and rescinded IVC and pt no longer meets inpatient psych criteria. Sitter was d/c on 05/05/23. PT/OT recs SNF. Pt chose [facility] and insurance Berkley Harvey is pending as per CM.   Significant Events: Admitted 04/18/2023 for sepsis due to cellulitis of his right LE stump   Significant Labs: hyponatremia 123 and hypochloremia of 90 with a CO2 of 21 blood glucose of 249, BUN of 71 and creatinine 1.98 up from 48 and 1.51 on 12/24 and albumin 3.2 with AST 55 and ALT 48. CBC showed leukocytosis 16.5 and anemia. Procalcitonin was elevated 2.13. Lactic acid was 1.1. UA showed more than 500 glucose.   Significant Imaging Studies: Right lower extremity ultrasound showed no DVT.   Antibiotic Therapy: Anti-infectives (From admission, onward)    Start     Dose/Rate Route Frequency Ordered Stop   05/08/23 0000  cefadroxil (DURICEF) 500 MG capsule        1,000 mg Oral 2 times daily 05/08/23 1259 05/22/23 2359   05/08/23 0000  doxycycline (VIBRA-TABS) 100 MG tablet        100 mg Oral Every 12 hours 05/08/23 1259 05/22/23 2359   05/07/23 1000  cefadroxil (DURICEF) capsule 1,000 mg        1,000 mg Oral 2 times daily 05/06/23 0902 05/21/23 0959   05/07/23 0800  doxycycline (VIBRA-TABS) tablet 100 mg        100 mg Oral Every 12 hours 05/06/23 0902 05/21/23 0759   05/03/23 1400  vancomycin (VANCOREADY) IVPB  1500 mg/300 mL        1,500 mg 150 mL/hr over 120 Minutes Intravenous Every 24 hours 05/02/23 0841 05/07/23 1045   04/26/23 1400  cefTRIAXone (ROCEPHIN) 2 g in sodium chloride 0.9 % 100 mL IVPB        2 g 200 mL/hr over 30 Minutes Intravenous Every 24 hours 04/25/23 1720 05/07/23 1740   04/26/23 0600  vancomycin (VANCOREADY) IVPB 2000 mg/400 mL  Status:  Discontinued        2,000 mg 200 mL/hr over 120 Minutes Intravenous Every 24 hours 04/25/23 1720 05/02/23 0841   04/24/23 0600  vancomycin (VANCOREADY) IVPB 2000 mg/400 mL         2,000 mg 200 mL/hr over 120 Minutes Intravenous Every 24 hours 04/23/23 1314 04/25/23 0728   04/23/23 0830  vancomycin (VANCOREADY) IVPB 1500 mg/300 mL  Status:  Discontinued        1,500 mg 150 mL/hr over 120 Minutes Intravenous Every 24 hours 04/22/23 0957 04/23/23 1314   04/20/23 0830  vancomycin (VANCOREADY) IVPB 1250 mg/250 mL  Status:  Discontinued        1,250 mg 166.7 mL/hr over 90 Minutes Intravenous Every 24 hours 04/20/23 0750 04/22/23 0957   04/20/23 0800  vancomycin (VANCOCIN) IVPB 1000 mg/200 mL premix  Status:  Discontinued        1,000 mg 200 mL/hr over 60 Minutes Intravenous Every 24 hours 04/19/23 1440 04/20/23 0750   04/19/23 1400  cefTRIAXone (ROCEPHIN) 2 g in sodium chloride 0.9 % 100 mL IVPB        2 g 200 mL/hr over 30 Minutes Intravenous Every 24 hours 04/19/23 0228 04/25/23 1454   04/19/23 0800  vancomycin (VANCOREADY) IVPB 750 mg/150 mL  Status:  Discontinued        750 mg 150 mL/hr over 60 Minutes Intravenous Every 24 hours 04/19/23 0237 04/19/23 1440   04/19/23 0230  vancomycin (VANCOCIN) IVPB 1000 mg/200 mL premix  Status:  Discontinued        1,000 mg 200 mL/hr over 60 Minutes Intravenous  Once 04/19/23 0228 04/19/23 0230   04/19/23 0130  ceFEPIme (MAXIPIME) 2 g in sodium chloride 0.9 % 100 mL IVPB        2 g 200 mL/hr over 30 Minutes Intravenous  Once 04/19/23 0121 04/19/23 0240   04/19/23 0130  Vancomycin (VANCOCIN) 1,500 mg in sodium chloride 0.9 % 500 mL IVPB        1,500 mg 250 mL/hr over 120 Minutes Intravenous  Once 04/19/23 0121 04/19/23 0438       Procedures: 04-20-2023 right BKA 04-21-2023 irrigation/debridement right knee  Consultants: Psych Orthopedics Infectious Disease   Hospital Course by Problem: * Osteomyelitis (HCC) - right foot 05-07-2023 has been resolved after he had his right BKA on 04-20-2023.  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 Pt changed to 2 weeks of duricef 1000 mg bid and doxycycline 100 gm bid x 14 days per ID recs.  05-08-2023 DC to SNF on Duricef 1000 mg bid x 2 weeks and doxycycline 100 mg bid x 2 weeks.  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  Wound infection 05-07-2023 resolved now that he has had right BKA  Diabetic infection of right foot (HCC) 05-07-2023 resolved now that he has had right BKA  Acute metabolic encephalopathy Resolved. Was thought to be due to sepsis, AKI  and hyponatremia.  BPH (benign prostatic hyperplasia) 05-07-2023 continue Flomax 0.4 mg qday  05-08-2023 stable.  Essential hypertension 05-07-2023 stable. On aldactone 50 mg qdaily, entresto 24-26 bid, coreg 25 mg bid, farxiga 10 mg daily  05-08-2023 stable.  Chronic hyponatremia - baseline Na 128-130. Chronic. Na 133. Stable. Asymptomatic. TSH normal @ 1.4, AM cortisol normal 13.1.  normal serum osm of 295, urine Na 18, urine osm 201.  BUN was elevated at 71. Looks more like non-hypotonic hyponatremia. Possibly due to uremia from elevated BUN.  BUN is now 16, and Na of 133.  05-08-2023 stable Na of 131.  Acute kidney injury superimposed on chronic kidney disease (HCC) Resolved with IVF.  Chronic combined systolic and diastolic CHF (congestive heart failure) (HCC) 05-07-2023 on GDMT with aldactone 50 mg qdaily, entresto 24-26 bid, coreg 25 mg bid, farxiga 10 mg daily 05-08-2023 stable.  Lower limb ischemia right leg with history of angioplasty and stent on 05/14/2021 05-07-2023 resolved now that he has had right BKA  Mixed hyperlipidemia 05-07-2023 remains on lipitor 80 mg qday. 05-08-2023 stable.  Diabetes mellitus type 2, insulin dependent (HCC) 05-06-2022 remains on lantus 17 unit qday, farxiga 10 mg daily, and SSI.  05-08-2023 stable.  Coronary artery disease with history of myocardial infarction without history of CABG 05-07-2023 stable. On ASA 81 mg qday, lipitor 80  mg qday, coreg 25 mg bid.  05-08-2023 stable.  Uncontrolled type 2 diabetes mellitus with hyperglycemia, with long-term current use of insulin (HCC)-resolved as of 05/07/2023 - The patient will be placed on supplemental coverage with NovoLog. - We will continue basal coverage.  DNR (do not resuscitate)/DNI(Do Not Intubate( Pt made DNR/DNI on admission.    Current severe episode of major depressive disorder without psychotic features (HCC) 05-07-2023 stable. Psych has signed off his case. Remains on trazodone 100 mg at bedtime, and cymbalta DR 20 mg bid.  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    Complete by: As directed    Call MD for:  difficulty breathing, headache or visual disturbances   Complete by: As directed    Call MD for:  extreme fatigue   Complete  by: As directed    Call MD for:  hives   Complete by: As directed    Call MD for:  persistant dizziness or light-headedness   Complete by: As directed    Call MD for:  redness, tenderness, or signs of infection (pain, swelling, redness, odor or green/yellow discharge around incision site)   Complete by: As directed    Call MD for:  temperature >100.4   Complete by: As directed    Diet - low sodium heart healthy   Complete by: As directed    Diet Carb Modified   Complete by: As directed    Discharge instructions   Complete by: As directed    1. Follow up with PCP in 2-4 weeks after hospital discharge 2. Follow up with Vein and Vascular surgery for staple removal in 1 week.   Increase activity slowly   Complete by: As directed    Increase activity slowly   Complete by: As directed    Leave dressing on - Keep it clean, dry, and intact until clinic visit   Complete by: As directed    Leave dressing on - Keep it clean, dry, and intact until clinic visit   Complete by: As directed       Allergies as of 05/08/2023   No Known Allergies      Medication List     STOP taking these medications    amitriptyline 25 MG tablet Commonly known as: ELAVIL   cephALEXin 500 MG capsule Commonly known as: KEFLEX   fluticasone 50 MCG/ACT nasal spray Commonly known as: FLONASE   insulin aspart protamine- aspart (70-30) 100 UNIT/ML injection Commonly known as: NOVOLOG MIX 70/30   mirtazapine 15 MG tablet Commonly known as: REMERON   multivitamin with minerals Tabs tablet   oxyCODONE 5 MG immediate release tablet Commonly known as: Oxy IR/ROXICODONE   oxyCODONE-acetaminophen 5-325 MG tablet Commonly known as: PERCOCET/ROXICET   sulfamethoxazole-trimethoprim 800-160 MG tablet Commonly known as: Bactrim DS    torsemide 20 MG tablet Commonly known as: DEMADEX       TAKE these medications    acetaminophen 325 MG tablet Commonly known as: TYLENOL Take 2 tablets (650 mg total) by mouth every 6 (six) hours as needed for mild pain (pain score 1-3) (or Fever >/= 101).   aspirin EC 81 MG tablet Take 1 tablet (81 mg total) by mouth daily.   atorvastatin 80 MG tablet Commonly known as: LIPITOR Take 1 tablet (80 mg total) by mouth daily. What changed: when to take this   carvedilol 25 MG tablet Commonly known as: COREG Take 25 mg by mouth 2 (two) times daily with a meal.   cefadroxil 500 MG capsule Commonly known as: DURICEF Take 2 capsules (1,000 mg total) by mouth 2 (two) times daily for 14 days.   clopidogrel 75 MG tablet Commonly known as: PLAVIX TAKE 1 TABLET(75 MG) BY MOUTH DAILY What changed: See the new instructions.   doxycycline 100 MG tablet Commonly known as: VIBRA-TABS Take 1 tablet (100 mg total) by mouth every 12 (twelve) hours for 14 days.   DULoxetine 20 MG capsule Commonly known as: CYMBALTA Take 1 capsule (20 mg total) by mouth 2 (two) times daily.   Ensure Max Protein Liqd Take 330 mLs (11 oz total) by mouth 2 (two) times daily.   Farxiga 10 MG Tabs tablet Generic drug: dapagliflozin propanediol Take 10 mg by mouth daily. What changed: Another medication with the same name was removed. Continue taking  this medication, and follow the directions you see here.   folic acid 1 MG tablet Commonly known as: FOLVITE Take 1 tablet (1 mg total) by mouth daily.   HYDROmorphone 2 MG tablet Commonly known as: DILAUDID Take 1 tablet (2 mg total) by mouth every 6 (six) hours as needed for severe pain (pain score 7-10) or moderate pain (pain score 4-6).   insulin aspart 100 UNIT/ML injection Commonly known as: novoLOG Inject 0-20 Units into the skin 3 (three) times daily with meals.   insulin aspart 100 UNIT/ML injection Commonly known as: novoLOG Inject 0-5 Units  into the skin at bedtime.   insulin glargine-yfgn 100 UNIT/ML injection Commonly known as: SEMGLEE Inject 0.17 mLs (17 Units total) into the skin daily. Start taking on: May 09, 2023   iron polysaccharides 150 MG capsule Commonly known as: NIFEREX Take 1 capsule (150 mg total) by mouth daily.   methocarbamol 500 MG tablet Commonly known as: ROBAXIN Take 1 tablet (500 mg total) by mouth every 8 (eight) hours as needed for up to 14 days for muscle spasms.   ondansetron 4 MG tablet Commonly known as: ZOFRAN Take 1 tablet (4 mg total) by mouth every 6 (six) hours as needed for nausea.   polyethylene glycol 17 g packet Commonly known as: MIRALAX / GLYCOLAX Take 17 g by mouth daily as needed for mild constipation.   pregabalin 75 MG capsule Commonly known as: Lyrica Take 1 capsule (75 mg total) by mouth 2 (two) times daily.   sacubitril-valsartan 24-26 MG Commonly known as: ENTRESTO Take 1 tablet by mouth every 12 (twelve) hours.   senna-docusate 8.6-50 MG tablet Commonly known as: Senokot-S Take 1 tablet by mouth at bedtime as needed for mild constipation.   spironolactone 50 MG tablet Commonly known as: ALDACTONE Take 1 tablet (50 mg total) by mouth daily.   tamsulosin 0.4 MG Caps capsule Commonly known as: FLOMAX Take 1 capsule (0.4 mg total) by mouth daily after supper.   thiamine 100 MG tablet Commonly known as: Vitamin B-1 Take 1 tablet (100 mg total) by mouth daily.   traZODone 100 MG tablet Commonly known as: DESYREL Take 1 tablet (100 mg total) by mouth at bedtime. What changed:  medication strength how much to take               Discharge Care Instructions  (From admission, onward)           Start     Ordered   05/08/23 0000  Leave dressing on - Keep it clean, dry, and intact until clinic visit        05/08/23 1259   05/08/23 0000  Leave dressing on - Keep it clean, dry, and intact until clinic visit        05/08/23 1302             Contact information for after-discharge care     Destination     [facility] Preferred SNF .   Service: Skilled Nursing Contact information: [address] [place] [address] [phone]                    No Known Allergies  Discharge Exam: Vitals:   05/08/23 0046 05/08/23 0833  BP: (!) 129/94 (!) 140/72  Pulse: 74 68  Resp: 18 16  Temp: 98.7 F (37.1 C) 98.2 F (36.8 C)  SpO2: 97% 95%    Physical Exam Vitals and nursing note reviewed.  HENT:     Head: Normocephalic and atraumatic.     Nose: Nose normal.  Eyes:     General: No scleral icterus. Cardiovascular:     Rate and Rhythm: Normal rate and regular rhythm.  Pulmonary:     Effort: Pulmonary effort is normal.     Breath sounds: Normal breath sounds.  Abdominal:     General: Bowel sounds are normal.     Palpations: Abdomen is soft.  Musculoskeletal:     Comments: Right BKA  Skin:    Capillary Refill: Capillary refill takes less than 2 seconds.  Neurological:     Mental Status: He is alert and oriented to person, place, and time.     The results of significant diagnostics from this hospitalization (including imaging, microbiology, ancillary and laboratory) are listed below for reference.     Labs: BNP (last 3 results) Recent Labs    11/13/22 0451 01/15/23 0754 02/14/23 1720  BNP 932.6* 1,760.5* 445.2*   Basic Metabolic Panel: Recent Labs  Lab 05/04/23 0432 05/05/23 0437 05/06/23 0602 05/07/23 0326 05/08/23 0517  NA 132* 132* 135 133* 131*  K 4.6 4.6 4.5 5.2* 4.4  CL 102 101 102 101 102  CO2 22 23 23 24 23   GLUCOSE 136* 134* 162* 107* 104*  BUN 19 21 16 16 17   CREATININE 0.70 0.74 0.88 0.86 0.74  CALCIUM 8.6* 8.5* 8.8* 8.8* 8.6*   Liver Function Tests: Recent Labs  Lab 05/06/23 1847  AST 14*  ALT 22  ALKPHOS 85  BILITOT 0.5  PROT 6.3*  ALBUMIN 2.1*   CBC: Recent Labs  Lab 05/03/23 0106 05/04/23 0432 05/05/23 0437  05/06/23 0602 05/07/23 0326  WBC 7.5 6.3 5.7 5.5 5.6  HGB 7.2* 7.6* 7.7* 8.0* 8.3*  HCT 22.4* 23.0* 23.3* 24.9* 26.0*  MCV 85.2 83.9 82.9 84.1 82.8  PLT 519* 489* 478* 456* 478*   CBG: Recent Labs  Lab 05/07/23 1244 05/07/23 1652 05/07/23 2112 05/08/23 0837 05/08/23 1113  GLUCAP 121* 114* 135* 99 186*   Urinalysis    Component Value Date/Time   COLORURINE STRAW (A) 04/18/2023 1758   APPEARANCEUR CLEAR (A) 04/18/2023 1758   LABSPEC 1.005 04/18/2023 1758   PHURINE 5.0 04/18/2023 1758   GLUCOSEU >=500 (A) 04/18/2023 1758   HGBUR SMALL (A) 04/18/2023 1758   BILIRUBINUR NEGATIVE 04/18/2023 1758   KETONESUR NEGATIVE 04/18/2023 1758   PROTEINUR NEGATIVE 04/18/2023 1758   NITRITE NEGATIVE 04/18/2023 1758   LEUKOCYTESUR NEGATIVE 04/18/2023 1758   Sepsis Labs Recent Labs  Lab 05/04/23 0432 05/05/23 0437 05/06/23 0602 05/07/23 0326  WBC 6.3 5.7 5.5 5.6   Microbiology No results found for this or any previous visit (from the past 240 hours).  Procedures/Studies: [place] OR NERVE BLOCK-IMAGE ONLY [facility]) Result Date: 04/20/2023 There is no interpretation for this exam.  This order is for images obtained during a surgical procedure.  Please See "Surgeries" Tab for more information regarding the procedure.   MR FOOT RIGHT WO CONTRAST Result Date: 04/19/2023 CLINICAL DATA:  Osteomyelitis, foot EXAM: MRI OF THE RIGHT FOREFOOT WITHOUT CONTRAST TECHNIQUE: Multiplanar, multisequence MR imaging of the right foot was performed. No intravenous contrast was administered. COMPARISON:  X-ray 04/19/2023, 02/17/2023 FINDINGS: Bones/Joint/Cartilage Postsurgical changes following transmetatarsal amputation of the first through fifth rays. Bone marrow edema is present throughout the residual first through fourth metatarsals with intermediate to low T1 marrow signal. No signal abnormality within the residual fifth metatarsal. No tarsometatarsal joint effusion. Preserved marrow signal within the  hindfoot and midfoot. No fracture or dislocation. Ligaments Intact Lisfranc ligament. Muscles and Tendons Amputation changes of the musculotendinous structures of the forefoot. Diffuse edema-like signal of the foot musculature which may represent changes related to denervation and/or myositis. No tenosynovitis. Soft tissues Shallow soft tissue wound or ulceration at the medial aspect of the distal stump with sinus tracks extending to the resection margins of the first and second metatarsals. Small amount of fluid surrounds both the first and second metatarsal distally. Susceptibility artifact within the tissues adjacent to the first metatarsal suggest soft tissue gas. Generalized soft tissue swelling with subcutaneous edema. IMPRESSION: 1. Prior transmetatarsal amputation of the right foot with findings of acute osteomyelitis involving the first through fourth metatarsals. 2. Shallow soft tissue wound or ulceration at the medial aspect of the distal stump with sinus tracks extending to the resection margins of the first and second metatarsals. Small amount of fluid surrounds both the first and second metatarsal distally. Susceptibility artifact within the tissues adjacent to the first metatarsal suggest soft tissue gas. Electronically Signed   By: Duanne Guess D.O.   On: 04/19/2023 14:13   DG Foot Complete Right Result Date: 04/19/2023 CLINICAL DATA:  Severe right foot cellulitis.  Previous amputation. EXAM: RIGHT FOOT COMPLETE - 3+ VIEW COMPARISON:  None Available. FINDINGS: Previous amputation of all digits is seen at the level of the mid metatarsals. Prominent distal soft tissue swelling is seen. Osteolysis and periostitis is seen involving the 1st and 4th metatarsals, highly suspicious for osteomyelitis. IMPRESSION: Findings highly suspicious for osteomyelitis involving the 1st and 4th metatarsals. Electronically Signed   By: Danae Orleans M.D.   On: 04/19/2023 11:21   US Venous Img Lower Right (DVT  Study) Result Date: 04/18/2023 CLINICAL DATA:  Right lower extremity swelling. EXAM: Right LOWER EXTREMITY VENOUS DOPPLER ULTRASOUND TECHNIQUE: Gray-scale sonography with compression, as well as color and duplex ultrasound, were performed to evaluate the deep venous system(s) from the level of the common femoral vein through the popliteal and proximal calf veins. COMPARISON:  None Available. FINDINGS: VENOUS Normal compressibility of the common femoral, superficial femoral, and popliteal veins, as well as the visualized calf veins. Visualized portions of profunda femoral vein and great saphenous vein unremarkable. No filling defects to suggest DVT on grayscale or color Doppler imaging. Doppler waveforms show normal direction of venous flow, normal respiratory plasticity and response to augmentation. Limited views of the contralateral common femoral vein are unremarkable. OTHER There is subcutaneous edema. Limitations: none IMPRESSION: No DVT identified. Electronically Signed   By: Elgie Collard M.D.   On: 04/18/2023 19:13   DG Knee Complete 4 Views Right Result Date: 04/15/2023 CLINICAL DATA:  Fall, right knee pain EXAM: RIGHT KNEE - COMPLETE 4+ VIEW COMPARISON:  None Available. FINDINGS: No acute bony abnormality. Specifically, no fracture, subluxation, or dislocation. Joint space narrowing in the lateral compartment. No joint effusion. IMPRESSION: No acute bony abnormality. Electronically Signed   By: Charlett Nose M.D.   On: 04/15/2023 19:06    Time coordinating discharge: 50 mins  SIGNED:  Carollee Herter, DO [facility] 05/08/23, 1:02 PM

## 2023-05-08 NOTE — Plan of Care (Signed)
  Problem: Clinical Measurements: Goal: Diagnostic test results will improve Outcome: Progressing   Problem: Coping: Goal: Ability to adjust to condition or change in health will improve Outcome: Progressing   Problem: Health Behavior/Discharge Planning: Goal: Ability to manage health-related needs will improve Outcome: Progressing   Problem: Metabolic: Goal: Ability to maintain appropriate glucose levels will improve Outcome: Progressing   Problem: Clinical Measurements: Goal: Diagnostic test results will improve Outcome: Progressing   Problem: Activity: Goal: Risk for activity intolerance will decrease Outcome: Progressing   Problem: Nutrition: Goal: Adequate nutrition will be maintained Outcome: Progressing   Problem: Pain Management: Goal: General experience of comfort will improve Outcome: Progressing

## 2023-05-08 NOTE — TOC Progression Note (Signed)
Transition of Care [facility]) - Progression Note    Patient Details  Name: Manuel [name] MRN: [id number] Date of Birth: Oct 10, 1945  Transition of Care [facility]) CM/SW Contact  Marlowe Sax, RN Phone Number: 05/08/2023, 12:11 PM  Clinical Narrative:    approved. Approval #: [id number]    Expected Discharge Plan: Skilled Nursing Facility Barriers to Discharge: Insurance Authorization  Expected Discharge Plan and Services   Discharge Planning Services: CM Consult   Living arrangements for the past 2 months: Single Family Home                                       Social Determinants of Health (SDOH) Interventions SDOH Screenings   Food Insecurity: No Food Insecurity (04/19/2023)  Housing: Low Risk  (04/19/2023)  Transportation Needs: No Transportation Needs (04/19/2023)  Utilities: Not At Risk (04/19/2023)  Financial Resource Strain: Patient Unable To Answer (02/20/2023)   Received from [facility]  Social Connections: Unknown (04/22/2023)  Tobacco Use: Low Risk  (04/21/2023)  Recent Concern: Tobacco Use - Medium Risk (01/28/2023)   Received from [facility]    Readmission Risk Interventions    02/18/2023   11:58 AM 02/15/2023    4:10 PM  Readmission Risk Prevention Plan  Transportation Screening  Complete  PCP or Specialist Appt within 3-5 Days Complete Complete  HRI or Home Care Consult Complete Complete  Social Work Consult for Recovery Care Planning/Counseling Complete Complete  Palliative Care Screening Not Applicable Not Applicable  Medication Review Oceanographer) Complete Complete

## 2023-05-08 NOTE — Discharge Instructions (Signed)
Dressing Changes for amputation.  Dressing Changes to be done Daily until staples have been removed.   Remove old dressing  Zeroform over staple line then Cover with 4x4 Guaze then Cover with ABD Pads then Wrap with Kerlex then Wrap Snugly with ace bandage.

## 2023-05-08 NOTE — Progress Notes (Signed)
PROGRESS NOTE    Manuel [name]  WUJ:[id number] DOB: 1946/04/20 DOA: 04/18/2023 PCP: Gracelyn Nurse, MD  Subjective: Pt seen and examined. In good spirits. No complaints. Met with pt's wife [name] at bedside. Pt has insurance authorization and SNF bed ready for today.   Hospital Course: HPI: Manuel [name] is a 78 y.o. Caucasian male with medical history significant for osteoarthritis, coronary artery disease, essential hypertension, carotid artery disease, DDD, type 2 diabetes mellitus, gout, dyslipidemia and ischemic cardiomyopathy as well as OSA and peripheral neuropathy, who presented to the emergency room with acute onset of altered mental status with confusion.  He had a right foot transmetatarsal amputation by Dr. Wyn Quaker this summer.  Since then he has had an ulcer which has been draining.  He admitted to associated right foot pain with swelling and erythema.  He was seen in the ER for mechanical fall 4 days ago and was given p.o. Bactrim and Keflex which he has been taking but missed Christmas Day.  He was having mild AKI then.  He admitted to diminish fluid intake likely.   Brief Hospital course:  78yo with h/o CAD, HTN, DM, and chronic systolic CHF who presented on 95/62 with AMS and wound infection s/p recent I&D of R groin wound and prior R 4th/5th partial ray amputation (7/29).  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  He then underwent R knee I&D for septic/gouty arthritis on 12/30.  He will need SNF rehab.  Vascular surgery is recommending ASA 81 mg daily + Plavix and Lipitor.  ID is consulting for antibiotic recommendations.  Significant Events: Admitted 04/18/2023 for sepsis due to cellulitis of his right LE stump   Significant Labs: hyponatremia 123 and hypochloremia of 90 with a CO2 of 21 blood glucose of 249, BUN of 71 and creatinine 1.98 up from 48 and 1.51 on 12/24 and albumin 3.2 with AST 55 and ALT 48. CBC showed leukocytosis 16.5 and anemia.  Procalcitonin was elevated 2.13. Lactic acid was 1.1. UA showed more than 500 glucose.   Significant Imaging Studies: Right lower extremity ultrasound showed no DVT.   Antibiotic Therapy: Anti-infectives (From admission, onward)    Start     Dose/Rate Route Frequency Ordered Stop   05/07/23 1000  cefadroxil (DURICEF) capsule 1,000 mg        1,000 mg Oral 2 times daily 05/06/23 0902 05/21/23 0959   05/07/23 0800  doxycycline (VIBRA-TABS) tablet 100 mg        100 mg Oral Every 12 hours 05/06/23 0902 05/21/23 0759   05/03/23 1400  vancomycin (VANCOREADY) IVPB 1500 mg/300 mL        1,500 mg 150 mL/hr over 120 Minutes Intravenous Every 24 hours 05/02/23 0841 05/06/23 1822   04/26/23 1400  cefTRIAXone (ROCEPHIN) 2 g in sodium chloride 0.9 % 100 mL IVPB        2 g 200 mL/hr over 30 Minutes Intravenous Every 24 hours 04/25/23 1720 05/06/23 1650   04/26/23 0600  vancomycin (VANCOREADY) IVPB 2000 mg/400 mL  Status:  Discontinued        2,000 mg 200 mL/hr over 120 Minutes Intravenous Every 24 hours 04/25/23 1720 05/02/23 0841   04/24/23 0600  vancomycin (VANCOREADY) IVPB 2000 mg/400 mL        2,000 mg 200 mL/hr over 120 Minutes Intravenous Every 24 hours 04/23/23 1314 04/25/23 0728   04/23/23 0830  vancomycin (VANCOREADY) IVPB 1500 mg/300 mL  Status:  Discontinued  1,500 mg 150 mL/hr over 120 Minutes Intravenous Every 24 hours 04/22/23 0957 04/23/23 1314   04/20/23 0830  vancomycin (VANCOREADY) IVPB 1250 mg/250 mL  Status:  Discontinued        1,250 mg 166.7 mL/hr over 90 Minutes Intravenous Every 24 hours 04/20/23 0750 04/22/23 0957   04/20/23 0800  vancomycin (VANCOCIN) IVPB 1000 mg/200 mL premix  Status:  Discontinued        1,000 mg 200 mL/hr over 60 Minutes Intravenous Every 24 hours 04/19/23 1440 04/20/23 0750   04/19/23 1400  cefTRIAXone (ROCEPHIN) 2 g in sodium chloride 0.9 % 100 mL IVPB        2 g 200 mL/hr over 30 Minutes Intravenous Every 24 hours 04/19/23 0228 04/25/23 1454    04/19/23 0800  vancomycin (VANCOREADY) IVPB 750 mg/150 mL  Status:  Discontinued        750 mg 150 mL/hr over 60 Minutes Intravenous Every 24 hours 04/19/23 0237 04/19/23 1440   04/19/23 0230  vancomycin (VANCOCIN) IVPB 1000 mg/200 mL premix  Status:  Discontinued        1,000 mg 200 mL/hr over 60 Minutes Intravenous  Once 04/19/23 0228 04/19/23 0230   04/19/23 0130  ceFEPIme (MAXIPIME) 2 g in sodium chloride 0.9 % 100 mL IVPB        2 g 200 mL/hr over 30 Minutes Intravenous  Once 04/19/23 0121 04/19/23 0240   04/19/23 0130  Vancomycin (VANCOCIN) 1,500 mg in sodium chloride 0.9 % 500 mL IVPB        1,500 mg 250 mL/hr over 120 Minutes Intravenous  Once 04/19/23 0121 04/19/23 0438       Procedures: 04-20-2023 right BKA 04-21-2023 irrigation/debridement right knee  Consultants: Psych Orthopedics Infectious Disease    Assessment and Plan: * Osteomyelitis (HCC) - right foot 05-07-2023 has been resolved after he had his right BKA on 04-20-2023.  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 Pt changed to 2 weeks of duricef 1000 mg bid and doxycycline 100 gm bid x 14 days per ID recs.  05-08-2023 DC to SNF on Duricef 1000 mg bid x 2 weeks and doxycycline 100 mg bid x 2 weeks.  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  Wound infection 05-07-2023 resolved now that he has had right BKA  Diabetic infection of right foot (HCC) 05-07-2023 resolved now that he has had right BKA  Acute metabolic encephalopathy Resolved. Was thought to be due to sepsis, AKI and hyponatremia.  BPH (benign prostatic hyperplasia) 05-07-2023 continue Flomax 0.4 mg qday  05-08-2023 stable.  Essential hypertension 05-07-2023 stable. On aldactone 50 mg qdaily, entresto 24-26 bid, coreg 25 mg bid, farxiga 10 mg daily  05-08-2023 stable.  Chronic hyponatremia - baseline Na  128-130. Chronic. Na 133. Stable. Asymptomatic. TSH normal @ 1.4, AM cortisol normal 13.1.  normal serum osm of 295, urine Na 18, urine osm 201.  BUN was elevated at 71. Looks more like non-hypotonic hyponatremia. Possibly due to uremia from elevated BUN.  BUN is now 16, and Na of 133.  05-08-2023 stable Na of 131.  Acute kidney injury superimposed on chronic kidney disease (HCC) Resolved with IVF.  Chronic combined systolic and diastolic CHF (congestive heart failure) (HCC) 05-07-2023 on GDMT with aldactone 50 mg qdaily, entresto 24-26 bid, coreg 25 mg bid, farxiga 10 mg daily 05-08-2023 stable.  Lower limb ischemia right leg  with history of angioplasty and stent on 05/14/2021 05-07-2023 resolved now that he has had right BKA  Mixed hyperlipidemia 05-07-2023 remains on lipitor 80 mg qday. 05-08-2023 stable.  Diabetes mellitus type 2, insulin dependent (HCC) 05-06-2022 remains on lantus 17 unit qday, farxiga 10 mg daily, and SSI.  05-08-2023 stable.  Coronary artery disease with history of myocardial infarction without history of CABG 05-07-2023 stable. On ASA 81 mg qday, lipitor 80 mg qday, coreg 25 mg bid.  05-08-2023 stable.  Uncontrolled type 2 diabetes mellitus with hyperglycemia, with long-term current use of insulin (HCC)-resolved as of 05/07/2023 - The patient will be placed on supplemental coverage with NovoLog. - We will continue basal coverage.  DNR (do not resuscitate)/DNI(Do Not Intubate( Pt made DNR/DNI on admission.    Current severe episode of major depressive disorder without psychotic features (HCC) 05-07-2023 stable. Psych has signed off his case. Remains on trazodone 100 mg at bedtime, and cymbalta DR 20 mg bid.  Dyslipidemia - We will continue statin therapy       DVT prophylaxis: enoxaparin (LOVENOX) injection 40 mg Start: 04/19/23 1000    Code Status: Limited: Do not attempt resuscitation (DNR) -DNR-LIMITED -Do Not Intubate/DNI  Family  Communication: discussed with pt and wife at bedside Disposition Plan: SNF Reason for continuing need for hospitalization: medically stable for DC.  Objective: Vitals:   05/07/23 0815 05/07/23 1536 05/08/23 0046 05/08/23 0833  BP: 133/71 139/77 (!) 129/94 (!) 140/72  Pulse: 76 74 74 68  Resp: 16 16 18 16   Temp: 98.4 F (36.9 C) 98.5 F (36.9 C) 98.7 F (37.1 C) 98.2 F (36.8 C)  TempSrc: Oral Oral  Oral  SpO2: 98% 100% 97% 95%  Weight:      Height:        Intake/Output Summary (Last 24 hours) at 05/08/2023 1249 Last data filed at 05/08/2023 1308 Gross per 24 hour  Intake --  Output 3500 ml  Net -3500 ml   Filed Weights   04/18/23 1755  Weight: 83.9 kg    Examination:  Physical Exam Vitals and nursing note reviewed.  HENT:     Head: Normocephalic and atraumatic.     Nose: Nose normal.  Eyes:     General: No scleral icterus. Cardiovascular:     Rate and Rhythm: Normal rate and regular rhythm.  Pulmonary:     Effort: Pulmonary effort is normal.     Breath sounds: Normal breath sounds.  Abdominal:     General: Bowel sounds are normal.     Palpations: Abdomen is soft.  Musculoskeletal:     Comments: Right BKA  Skin:    Capillary Refill: Capillary refill takes less than 2 seconds.  Neurological:     Mental Status: He is alert and oriented to person, place, and time.     Data Reviewed: I have personally reviewed following labs and imaging studies  CBC: Recent Labs  Lab 05/03/23 0106 05/04/23 0432 05/05/23 0437 05/06/23 0602 05/07/23 0326  WBC 7.5 6.3 5.7 5.5 5.6  HGB 7.2* 7.6* 7.7* 8.0* 8.3*  HCT 22.4* 23.0* 23.3* 24.9* 26.0*  MCV 85.2 83.9 82.9 84.1 82.8  PLT 519* 489* 478* 456* 478*   Basic Metabolic Panel: Recent Labs  Lab 05/04/23 0432 05/05/23 0437 05/06/23 0602 05/07/23 0326 05/08/23 0517  NA 132* 132* 135 133* 131*  K 4.6 4.6 4.5 5.2* 4.4  CL 102 101 102 101 102  CO2 22 23 23 24 23   GLUCOSE 136* 134* 162* 107* 104*  BUN 19 21 16 16  17   CREATININE 0.70 0.74 0.88 0.86 0.74  CALCIUM 8.6* 8.5* 8.8* 8.8* 8.6*   GFR: Estimated Creatinine Clearance: 79 mL/min (by C-G formula based on SCr of 0.74 mg/dL). Liver Function Tests: Recent Labs  Lab 05/06/23 1847  AST 14*  ALT 22  ALKPHOS 85  BILITOT 0.5  PROT 6.3*  ALBUMIN 2.1*   BNP (last 3 results) Recent Labs    11/13/22 0451 01/15/23 0754 02/14/23 1720  BNP 932.6* 1,760.5* 445.2*   CBG: Recent Labs  Lab 05/07/23 1244 05/07/23 1652 05/07/23 2112 05/08/23 0837 05/08/23 1113  GLUCAP 121* 114* 135* 99 186*    Scheduled Meds:  aspirin EC  81 mg Oral Daily   atorvastatin  80 mg Oral QHS   carvedilol  25 mg Oral BID WC   cefadroxil  1,000 mg Oral BID   clopidogrel  75 mg Oral Daily   dapagliflozin propanediol  10 mg Oral Daily   doxycycline  100 mg Oral Q12H   DULoxetine  20 mg Oral BID   enoxaparin (LOVENOX) injection  40 mg Subcutaneous Q24H   folic acid  1 mg Oral Daily   HYDROmorphone  2 mg Oral Q6H   insulin aspart  0-20 Units Subcutaneous TID WC   insulin aspart  0-5 Units Subcutaneous QHS   insulin glargine-yfgn  17 Units Subcutaneous Daily   iron polysaccharides  150 mg Oral Daily   methocarbamol  500 mg Oral TID   multivitamin with minerals  1 tablet Oral Daily   nystatin  5 mL Oral QID   polyethylene glycol  17 g Oral Daily   pregabalin  100 mg Oral BID   Ensure Max Protein  11 oz Oral Daily   sacubitril-valsartan  1 tablet Oral Q12H   spironolactone  50 mg Oral Daily   tamsulosin  0.4 mg Oral QPC supper   thiamine  100 mg Oral Daily   traZODone  100 mg Oral Q2000   Continuous Infusions:   LOS: 19 days   Time spent: 45 minutes  Carollee Herter, DO  [facility]  05/08/2023, 12:49 PM

## 2023-05-08 NOTE — Progress Notes (Signed)
Palliative Care Progress Note, Assessment & Plan   Patient Name: Manuel [name]       Date: 05/08/2023 DOB: 1946-03-17  Age: 78 y.o. MRN#: [id number] Attending Physician: Carollee Herter, DO Primary Care Physician: Gracelyn Nurse, MD Admit Date: 04/18/2023  Subjective: Patient is lying in bed in no apparent distress.  He is awake, alert, and oriented x 4.  He acknowledges my presence and is able to make his wishes known.  His wife Sippel is at bedside during my visit.  HPI: 78 y.o. male  with past medical history of CAD s/p PCI mRCA (3 overlapping Xience stents 2011), extensive PAD s/p PTA L common Iliac, R peroneal, R distal SFA/prox popliteal, and stent to R distal SFA/prox popliteal arteries (05/14/21), HFmrEF (40-45% 2020), HTN, HLD, DM2 depression, alcohol abuse  admitted on 04/18/2023 with AMS and wound infection s/p recent I&D of right groin wound and prior right fourth/fifth partial ray amputation.   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  He then underwent right knee I&D for septic/gouty arthritis on 12/30.   Vascular surgery is recommending aspirin daily with Plavix and Lipitor.   ID is consulted for antibiotic recommendations.   PMT was consulted to discuss boundaries and goals of care.   Of note, Mr. Pfost familiar to PMT services as he has been followed closely during previous hospitalizations.  Summary of counseling/coordination of care: Extensive chart review completed prior to meeting patient including labs, vital signs, imaging, progress notes, orders, and available advanced directive documents from current and previous encounters.   After reviewing the patient's chart and assessing the patient at bedside, I spoke with patient in regards to symptom management and  goals of care.   Symptoms assessed.  Patient continues to endorse that his pain is well-controlled with current regimen.  No adjustment to [facility].  Patient is attempting to have a bowel movement.  Counseled with patient and wife in regards to bowel regimen to avoid constipation.  Education provided on utilizing stool softeners, MiraLAX, and perhaps even enema should patient become constipated given narcotic use.  Family appreciative of discussion.  Patient's wife shared concern that patient does not have a PCP that he feels comfortable seeing after discharge from hospital.  We discussed finding a new PCP that could do virtual visits or even at home visits.  I shared with family I would pass on to [facility] they are interested in seeking patient had to establish with a new PCP at discharge.  Given the patient is transferring to another medical facility, I completed a MOST form, with wife at bedside.  Patient's wishes are as follows:  Cardiopulmonary Resuscitation: Do Not Attempt Resuscitation (DNR/No CPR)  Medical Interventions: Limited Additional Interventions: Use medical treatment, IV fluids and cardiac monitoring as indicated, DO NOT USE intubation or mechanical ventilation. May consider use of less invasive airway support such as BiPAP or CPAP. Also provide comfort measures. Transfer to the hospital if indicated. Avoid intensive care.   Antibiotics: Antibiotics if indicated  IV Fluids: IV fluids for a defined trial period  Feeding Tube: No feeding tube    Copy of most form sent to medical records for copy to  be placed in patient's EMR ACP tab.  Hard copy of MOST form placed in patient's shadow chart.  Goals are clear.  Symptom burden is low.  Patient/family have PMT contact information and were encouraged to contact PMT with any future acute palliative needs.   PMT will monitor the patient peripherally and shadow his chart. Please re-engage with PMT if goals change, at patient/family's request, or if  patient's health deteriorates during hospitalization.    Physical Exam Vitals reviewed.  Constitutional:      Appearance: He is normal weight.  HENT:     Nose: Nose normal.     Mouth/Throat:     Mouth: Mucous membranes are moist.  Eyes:     Pupils: Pupils are equal, round, and reactive to light.  Cardiovascular:     Rate and Rhythm: Normal rate.  Pulmonary:     Effort: Pulmonary effort is normal.  Abdominal:     Palpations: Abdomen is soft.  Skin:    General: Skin is warm and dry.  Neurological:     Mental Status: He is alert and oriented to person, place, and time.  Psychiatric:        Mood and Affect: Mood normal.        Behavior: Behavior normal.        Thought Content: Thought content normal.        Judgment: Judgment normal.             Total Time 50 minutes   Time spent includes: Detailed review of medical records (labs, imaging, vital signs), medically appropriate exam (mental status, respiratory, cardiac, skin), discussed with treatment team, counseling and educating patient, family and staff, documenting clinical information, medication management and coordination of care.  Samara Deist L. Bonita Quin, DNP, FNP-BC Palliative Medicine Team

## 2023-05-20 ENCOUNTER — Ambulatory Visit: Payer: Medicare Other | Attending: Infectious Diseases | Admitting: Infectious Diseases

## 2023-05-20 DIAGNOSIS — Z79899 Other long term (current) drug therapy: Secondary | ICD-10-CM | POA: Insufficient documentation

## 2023-05-20 DIAGNOSIS — Z89511 Acquired absence of right leg below knee: Secondary | ICD-10-CM | POA: Insufficient documentation

## 2023-05-20 DIAGNOSIS — I10 Essential (primary) hypertension: Secondary | ICD-10-CM | POA: Diagnosis present

## 2023-05-20 DIAGNOSIS — Y838 Other surgical procedures as the cause of abnormal reaction of the patient, or of later complication, without mention of misadventure at the time of the procedure: Secondary | ICD-10-CM | POA: Insufficient documentation

## 2023-05-20 DIAGNOSIS — G4733 Obstructive sleep apnea (adult) (pediatric): Secondary | ICD-10-CM | POA: Diagnosis not present

## 2023-05-20 DIAGNOSIS — T8743 Infection of amputation stump, right lower extremity: Secondary | ICD-10-CM | POA: Diagnosis present

## 2023-05-20 DIAGNOSIS — E1151 Type 2 diabetes mellitus with diabetic peripheral angiopathy without gangrene: Secondary | ICD-10-CM | POA: Diagnosis not present

## 2023-05-20 DIAGNOSIS — Z7902 Long term (current) use of antithrombotics/antiplatelets: Secondary | ICD-10-CM | POA: Insufficient documentation

## 2023-05-20 DIAGNOSIS — I255 Ischemic cardiomyopathy: Secondary | ICD-10-CM | POA: Diagnosis not present

## 2023-05-20 DIAGNOSIS — M00861 Arthritis due to other bacteria, right knee: Secondary | ICD-10-CM | POA: Insufficient documentation

## 2023-05-20 DIAGNOSIS — Z794 Long term (current) use of insulin: Secondary | ICD-10-CM | POA: Insufficient documentation

## 2023-05-20 DIAGNOSIS — E785 Hyperlipidemia, unspecified: Secondary | ICD-10-CM | POA: Diagnosis not present

## 2023-05-20 DIAGNOSIS — B9561 Methicillin susceptible Staphylococcus aureus infection as the cause of diseases classified elsewhere: Secondary | ICD-10-CM | POA: Diagnosis not present

## 2023-05-20 DIAGNOSIS — M009 Pyogenic arthritis, unspecified: Secondary | ICD-10-CM

## 2023-05-20 DIAGNOSIS — E119 Type 2 diabetes mellitus without complications: Secondary | ICD-10-CM | POA: Diagnosis present

## 2023-05-20 NOTE — Progress Notes (Unsigned)
NAME: Manuel [name]  DOB: May 07, 1945  MRN: [id number]  Date/Time: 05/20/2023 11:49 AM  Subjective:   ?follow up visit - video visit after recent discharge The purpose of this virtual visit is to provide medical care while limiting exposure to the novel coronavirus (COVID19) for both patient and office staff.   Consent was obtained for videoe visit:  Yes.   Answered questions that patient had about telehealth interaction:  Yes.   I discussed the limitations, risks, security and privacy concerns of performing an evaluation and management service by telephone. I also discussed with the patient that there may be a patient responsible charge related to this service. The patient expressed understanding and agreed to proceed.   Patient Location: Home Provider Location:office Wife and patient on the call    Consent was obtained for phone visit:  Yes.   Answered questions that patient had about telehealth interaction:  Yes.   I discussed the limitations, risks, security and privacy concerns of performing an evaluation and management service by telephone. I also discussed with the patient that there may be a patient responsible charge related to this service. The patient expressed understanding and agreed to proceed.    78 y.o. 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, followed by TMA in oct 2024 presented with infected TMA site Pt has complicated PAD history and foot infection    he had endarterectomies of both rt and left common, profunda and superficial femoral artery, fogary embolectomy of rt SFA, stent placement in b/l common iliac arteries, left external iliac and Rt PTA, on 11/15/22  Had wound dehiscence on the rt groin  , he underwent debridement of the wound on the rt groin on 01/09/23 with excision of 15 cm2 of skin, soft tissue, muscle fascia and application of neg pressure vac He had serratia , staph aureus and ecoli in  culture and received Iv antibiotic followed by PO cipro+ Augmentin He also underwent TMA on 02/17/23 He followed with Podiatrist as OP On 12/24 he came to the ED with rt knee and leg pain after he fell out of bed. The TMA site was red , weeping and and he was started on keflex and bactrim and discharged from ED. Xray was done in the ED on 12/14 and it did not show any fracture.  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 , had Rt knee washout on 04/21/23 Culture from knee negative Had MSSA in the rt foot wound Pt was treated with IV vanco and Ib+v ceftriaxone until 05/06/23 and then placed on cefudroxil and doxy for 2 more weeks- HE has 6 pills left  HE is doing much better Rt BKA wound is healing well Has an aptt wit vein and vascular soon  Past Medical History:  Diagnosis Date   Acute ST elevation myocardial infarction (STEMI) of inferior wall (HCC) 04/19/2010   a.) transfered from [facility] --> LHC/PCI (very difficult procedure) --> 3.0 x 23 mm and 3.0 x 12 mm Xience stents to RCA   Allergies    Arthritis    Benign essential hypertension    Bilateral carotid artery disease (HCC) 05/08/2021   a.) carotid doppler 05/08/2021: 1-39% BICA   CAD (coronary artery disease) 04/19/2010   a.) inferior STEMI 04/19/2010 --> LHC/PCI: 50-70% pD1, 80% pRI, 90/90/90% RCA (overlapping 3.0 x 23  and 3.0 x 12 mm Xience DES); b.) MV 11/10/2018: fixed minimally reversible inferior perfusion defect   Cellulitis of foot    DDD (degenerative disc disease), cervical    Diabetes mellitus type 2, insulin dependent (HCC)    Diverticulosis    Full dentures    Gout    Hard of hearing    History of bilateral cataract extraction 2022   History of ETOH abuse    Hyperlipidemia    Ischemic cardiomyopathy 04/19/2010   a.) TTE 04/19/2010: 40%; b.) TTE 04/20/2014: EF >55%, mild RVE, triv PR, mild MR/TR, G1DD; c.) TTE 11/10/2018: EF 45%, inf HK, mild RVE,  triv TR/PR, mild MR, G1DD; d.) TTE 11/14/2022: EF 25-30%, basal anteroseptal, apical lateral, apical septal, and apex AK, mid anterolateral HK, mild-mod MR/TR   Long term current use of aspirin    Long term current use of clopidogrel    Lumbar degenerative disc disease    Lumbar radiculopathy    Lumbar vertebral fracture (chronic superior endplate of L1)    NSTEMI (non-ST elevated myocardial infarction) (HCC) 01/15/2023   OSA (obstructive sleep apnea)    a.) unable to tolerate nocturnal PAP therapy   Peripheral artery disease (HCC)    a.) stenting 05/14/21: 12 mm x 12 cm LifeStent RIGHT dis SFA/prox pop; b.) s/p cath directed thrombolysis RIGHT SFA/pop 06/14/21; c.) s/p mech thrombectomy + stenting 06/15/21: 8 mm x 25cm & 8 mm x 7.5cm Viabahn; d.) s/p BILAT CFA, profunda femoris, SFA endarterectomies + fogarty embolectomy + stenting 11/15/22: 12mm x 58mm Lifestream BILAT CIAs, 14 mm x 6 cm Lifestream & 13 mm x 5 cm Viabahn LEFT EIA   Peripheral neuropathy    Umbilical hernia     Past Surgical History:  Procedure Laterality Date   AMPUTATION Right 11/18/2022   Procedure: AMPUTATION 4TH AND 5TH RAY;  Surgeon: Linus Galas, [name];  Location: [facility];  Service: Orthopedics/Podiatry;  Laterality: Right;  4th and 5th toe   AMPUTATION Right 04/20/2023   Procedure: AMPUTATION BELOW KNEE;  Surgeon: Bertram Denver, MD;  Location: [facility];  Service: Vascular;  Laterality: Right;  block then general   APPLICATION OF WOUND VAC Right 01/09/2023   Procedure: APPLICATION OF WOUND VAC;  Surgeon: Annice Needy, MD;  Location: [facility];  Service: Vascular;  Laterality: Right;   CATARACT EXTRACTION W/PHACO Right 03/14/2021   Procedure: CATARACT EXTRACTION PHACO AND INTRAOCULAR LENS PLACEMENT (IOC) RIGHT DIABETIC;  Surgeon: Lockie Mola, MD;  Location: [facility];  Service: Ophthalmology;  Laterality: Right;  Diabetic 16.78 01:39.9   CATARACT EXTRACTION W/PHACO Left 03/28/2021   Procedure: CATARACT  EXTRACTION PHACO AND INTRAOCULAR LENS PLACEMENT (IOC) LEFT DIABETIC 6.93 01:22.0;  Surgeon: Lockie Mola, MD;  Location: [facility];  Service: Ophthalmology;  Laterality: Left;  Diabetic   COLONOSCOPY     CORONARY ANGIOPLASTY WITH STENT PLACEMENT  03/2010   Procedure: CORONARY ANGIOPLASTY WITH STENT PLACEMENT; Location: [name]   ENDARTERECTOMY FEMORAL Bilateral 11/15/2022   Procedure: BILATERAL COMMON FEMORAL PROFUNDA FEMORIS AND SUPERFICIAL FEMORAL ARTERY ENDARTECTOMIES, RIGHT FOGARTY EMBOLECTOMY OF THE RIGHT SFA  AND  POPLITEAL ARTERIES. AORTAGRAM AND RIGHT LOWER EXTREMITY ANGIOGRAM.;  Surgeon: Annice Needy, MD;  Location: [facility];  Service: Vascular;  Laterality: Bilateral;   INSERTION OF ILIAC STENT Bilateral 11/15/2022   Procedure: BILATERAL STENT INSERTION IN BILATERAL  COMMON ILIAC ARTERY, STENT INSERTION OF LEFT EXTERNAL ILIAC ARTERY. ANGIOPLASTY RIGHT TIBIAL  AND POPLITEAL ARTERY.;  Surgeon: Annice Needy, MD;  Location: [facility];  Service: Vascular;  Laterality: Bilateral;   IRRIGATION AND DEBRIDEMENT KNEE Right 04/21/2023   Procedure: IRRIGATION AND DEBRIDEMENT KNEE;  Surgeon: Campbell Stall, MD;  Location: [facility];  Service: Orthopedics;  Laterality: Right;   LOWER EXTREMITY ANGIOGRAPHY Right 05/14/2021   Procedure: LOWER EXTREMITY ANGIOGRAPHY;  Surgeon: Annice Needy, MD;  Location: [facility];  Service: Cardiovascular;  Laterality: Right;   LOWER EXTREMITY ANGIOGRAPHY Right 06/14/2021   Procedure: Lower Extremity Angiography;  Surgeon: Annice Needy, MD;  Location: [facility];  Service: Cardiovascular;  Laterality: Right;   LOWER EXTREMITY ANGIOGRAPHY Right 11/11/2022   Procedure: Lower Extremity Angiography;  Surgeon: Annice Needy, MD;  Location: [facility];  Service: Cardiovascular;  Laterality: Right;   LOWER EXTREMITY INTERVENTION Right 06/15/2021   Procedure: LOWER EXTREMITY INTERVENTION;  Surgeon: Annice Needy, MD;  Location: [facility];  Service: Cardiovascular;  Laterality: Right;   TONSILLECTOMY     TRANSMETATARSAL AMPUTATION Right 02/17/2023   Procedure: TRANSMETATARSAL AMPUTATION;  Surgeon: Rosetta Posner, [name];  Location: [facility];  Service: Orthopedics/Podiatry;  Laterality: Right;   WOUND DEBRIDEMENT Right 01/09/2023   Procedure: DEBRIDEMENT WOUND;  Surgeon: Annice Needy, MD;  Location: [facility];  Service: Vascular;  Laterality: Right;    Social History   Socioeconomic History   Marital status: Married    Spouse name: [name]   Number of children: 3   Years of education: Not on file   Highest education level: Not on file  Occupational History   Not on file  Tobacco Use   Smoking status: Never   Smokeless tobacco: Never  Vaping Use   Vaping status: Never Used  Substance and Sexual Activity   Alcohol use: Yes    Alcohol/week: 21.0 standard drinks of alcohol    Types: 7 Glasses of wine, 14 Cans of beer per week    Comment: occassional   Drug use: No   Sexual activity: Not on file  Other Topics Concern   Not on file  Social History Narrative   Not on file   Social Drivers of Health   Financial Resource Strain: Patient Unable To Answer (02/20/2023)   Received from [facility]   Overall Financial Resource Strain (CARDIA)    Difficulty of Paying Living Expenses: Patient unable to answer  Food Insecurity: No Food Insecurity (04/19/2023)   Hunger Vital Sign    Worried About Running Out of Food in the Last Year: Never true    Ran Out of Food in the Last Year: Never true  Transportation Needs: No Transportation Needs (04/19/2023)   PRAPARE - Administrator, Civil Service (Medical): No    Lack of Transportation (Non-Medical): No  Physical Activity: Not on file  Stress: Not on file  Social Connections: Unknown (04/22/2023)   Social Connection and Isolation Panel [NHANES]    Frequency of Communication with Friends and Family: More than three times a week     Frequency of Social Gatherings with Friends and Family: More than three times a week    Attends Religious Services: Patient declined    Database administrator or Organizations: No    Attends Banker Meetings: Patient declined    Marital Status: Married  Catering manager Violence: Not At Risk (04/19/2023)   Humiliation, Afraid, Rape, and Kick questionnaire    Fear of Current or Ex-Partner: No    Emotionally Abused: No    Physically Abused: No  Sexually Abused: No    Family History  Problem Relation Age of Onset   Scoliosis Mother    Heart disease Father    No Known Allergies I? Current Outpatient Medications  Medication Sig Dispense Refill   acetaminophen (TYLENOL) 325 MG tablet Take 2 tablets (650 mg total) by mouth every 6 (six) hours as needed for mild pain (pain score 1-3) (or Fever >/= 101).     aspirin EC 81 MG tablet Take 1 tablet (81 mg total) by mouth daily. 150 tablet 2   atorvastatin (LIPITOR) 80 MG tablet Take 1 tablet (80 mg total) by mouth daily. (Patient taking differently: Take 80 mg by mouth at bedtime.) 90 tablet 1   carvedilol (COREG) 25 MG tablet Take 25 mg by mouth 2 (two) times daily with a meal.     cefadroxil (DURICEF) 500 MG capsule Take 2 capsules (1,000 mg total) by mouth 2 (two) times daily for 14 days.     clopidogrel (PLAVIX) 75 MG tablet TAKE 1 TABLET(75 MG) BY MOUTH DAILY 90 tablet 1   doxycycline (VIBRA-TABS) 100 MG tablet Take 1 tablet (100 mg total) by mouth every 12 (twelve) hours for 14 days.     DULoxetine (CYMBALTA) 20 MG capsule Take 1 capsule (20 mg total) by mouth 2 (two) times daily.     Ensure Max Protein (ENSURE MAX PROTEIN) LIQD Take 330 mLs (11 oz total) by mouth 2 (two) times daily. (Patient taking differently: Take 11 oz by mouth daily.) 10000 mL 3   FARXIGA 10 MG TABS tablet Take 10 mg by mouth daily.     folic acid (FOLVITE) 1 MG tablet Take 1 tablet (1 mg total) by mouth daily. 90 tablet 0   HYDROmorphone (DILAUDID) 2  MG tablet Take 1 tablet (2 mg total) by mouth every 6 (six) hours as needed for severe pain (pain score 7-10) or moderate pain (pain score 4-6). 120 tablet 0   insulin aspart (NOVOLOG) 100 UNIT/ML injection Inject 0-20 Units into the skin 3 (three) times daily with meals.     insulin aspart (NOVOLOG) 100 UNIT/ML injection Inject 0-5 Units into the skin at bedtime.     insulin glargine-yfgn (SEMGLEE) 100 UNIT/ML injection Inject 0.17 mLs (17 Units total) into the skin daily.     iron polysaccharides (NIFEREX) 150 MG capsule Take 1 capsule (150 mg total) by mouth daily. 90 capsule 0   methocarbamol (ROBAXIN) 500 MG tablet Take 1 tablet (500 mg total) by mouth every 8 (eight) hours as needed for up to 14 days for muscle spasms.     ondansetron (ZOFRAN) 4 MG tablet Take 1 tablet (4 mg total) by mouth every 6 (six) hours as needed for nausea.     polyethylene glycol (MIRALAX / GLYCOLAX) 17 g packet Take 17 g by mouth daily as needed for mild constipation. 14 each 0   pregabalin (LYRICA) 75 MG capsule Take 1 capsule (75 mg total) by mouth 2 (two) times daily. 60 capsule 0   sacubitril-valsartan (ENTRESTO) 24-26 MG Take 1 tablet by mouth every 12 (twelve) hours.     senna-docusate (SENOKOT-S) 8.6-50 MG tablet Take 1 tablet by mouth at bedtime as needed for mild constipation. 30 tablet 0   spironolactone (ALDACTONE) 50 MG tablet Take 1 tablet (50 mg total) by mouth daily. 30 tablet 0   tamsulosin (FLOMAX) 0.4 MG CAPS capsule Take 1 capsule (0.4 mg total) by mouth daily after supper.     thiamine (VITAMIN B-1) 100 MG  tablet Take 1 tablet (100 mg total) by mouth daily. 90 tablet 0   traZODone (DESYREL) 100 MG tablet Take 1 tablet (100 mg total) by mouth at bedtime.     No current facility-administered medications for this visit.     Abtx:  Anti-infectives (From admission, onward)    None       REVIEW OF SYSTEMS:  Const: negative fever, negative chills, negative weight loss Eyes: negative diplopia  or visual changes, negative eye pain ENT: negative coryza, negative sore throat Resp: negative cough, hemoptysis, dyspnea Cards: negative for chest pain, palpitations, lower extremity edema GU: negative for frequency, dysuria and hematuria GI: Negative for abdominal pain, diarrhea, bleeding, constipation Skin: negative for rash and pruritus Heme: negative for easy bruising and gum/nose bleeding MS: rt BKA Neurolo:negative for headaches, dizziness, vertigo, memory problems  Psych:  anxiety, depression  Endocrine: ,none Allergy/Immunology- negative for any medication or food allergies ?  Objective:  VITALS:  There were no vitals taken for this visit.  PHYSICAL EXAM:  General: looks well Rt BKA site healed well Sutures present  Pertinent Labs None ? Impression/Recommendation ?Rt TMA site infection- underwent BKA Wound culture was MSSA treated   Rt knee septic arthritis- s/p I/D   On ceftriaxone and vanco- - culture no growth - took IV for  2 weeks ( 05/06/23)  followed by  PO cefudroxil 500mg  BID  + Doxy 100mg  BID X 2 weeks until 05/20/23- he has 3 more days left Will complete  Doing much better Moving the knee well  PAD-- h/o endarterectomies of both rt and left common, profunda and superficial femoral artery, fogary embolectomy of rt SFA, stent placement in b/l common iliac arteries, left external iliac and Rt PTA, on 11/15/22   Discussed the management with patient and wife Follow prn ? ?__total time on this video visit 20 min___________  Note:  This document was prepared using Dragon voice recognition software and may include unintentional dictation errors.

## 2023-05-29 ENCOUNTER — Telehealth (INDEPENDENT_AMBULATORY_CARE_PROVIDER_SITE_OTHER): Payer: Self-pay | Admitting: Nurse Practitioner

## 2023-05-29 NOTE — Telephone Encounter (Signed)
He can come in Monday

## 2023-05-29 NOTE — Telephone Encounter (Signed)
 Patient called stating he needs to see FB today or tomorrow [email or web address]. Patient had appointment for Friday, 2/7 but called yesterday to cancel due to ramp at his house not being ready. Appointment time he has is no longer available and schedule is full for today/tomorrow & next week for FB. Please advise.

## 2023-05-30 ENCOUNTER — Encounter (INDEPENDENT_AMBULATORY_CARE_PROVIDER_SITE_OTHER): Payer: Medicare Other

## 2023-05-30 ENCOUNTER — Ambulatory Visit (INDEPENDENT_AMBULATORY_CARE_PROVIDER_SITE_OTHER): Payer: Medicare Other | Admitting: Nurse Practitioner

## 2023-05-31 NOTE — Progress Notes (Signed)
Patient ID: Manuel [name], male   DOB: Aug 27, 1945, 78 y.o.   MRN: [id number]  No chief complaint on file.   HPI Manuel [name] is a 78 y.o. male.    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  He denies significant or severe phantom pains.  He states he has been doing well and is anxious to get a prosthesis.   Past Medical History:  Diagnosis Date   Acute ST elevation myocardial infarction (STEMI) of inferior wall (HCC) 04/19/2010   a.) transfered from [facility] to [facility] --> LHC/PCI (very difficult procedure) --> 3.0 x 23 mm and 3.0 x 12 mm Xience stents to RCA   Allergies    Arthritis    Benign essential hypertension    Bilateral carotid artery disease (HCC) 05/08/2021   a.) carotid doppler 05/08/2021: 1-39% BICA   CAD (coronary artery disease) 04/19/2010   a.) inferior STEMI 04/19/2010 --> LHC/PCI: 50-70% pD1, 80% pRI, 90/90/90% RCA (overlapping 3.0 x 23 and 3.0 x 12 mm Xience DES); b.) MV 11/10/2018: fixed minimally reversible inferior perfusion defect   Cellulitis of foot    DDD (degenerative disc disease), cervical    Diabetes mellitus type 2, insulin dependent (HCC)    Diverticulosis    Full dentures    Gout    Hard of hearing    History of bilateral cataract extraction 2022   History of ETOH abuse    Hyperlipidemia    Ischemic cardiomyopathy 04/19/2010   a.) TTE 04/19/2010: 40%; b.) TTE 04/20/2014: EF >55%, mild RVE, triv PR, mild MR/TR, G1DD; c.) TTE 11/10/2018: EF 45%, inf HK, mild RVE, triv TR/PR, mild MR, G1DD; d.) TTE 11/14/2022: EF 25-30%, basal anteroseptal, apical lateral, apical septal, and apex AK, mid anterolateral HK, mild-mod MR/TR   Long term current use of aspirin    Long term current use of clopidogrel    Lumbar degenerative disc disease    Lumbar radiculopathy    Lumbar vertebral fracture (chronic superior endplate of L1)    NSTEMI (non-ST elevated myocardial infarction) (HCC) 01/15/2023    OSA (obstructive sleep apnea)    a.) unable to tolerate nocturnal PAP therapy   Peripheral artery disease (HCC)    a.) stenting 05/14/21: 12 mm x 12 cm LifeStent RIGHT dis SFA/prox pop; b.) s/p cath directed thrombolysis RIGHT SFA/pop 06/14/21; c.) s/p mech thrombectomy + stenting 06/15/21: 8 mm x 25cm & 8 mm x 7.5cm Viabahn; d.) s/p BILAT CFA, profunda femoris, SFA endarterectomies + fogarty embolectomy + stenting 11/15/22: 12mm x 58mm Lifestream BILAT CIAs, 14 mm x 6 cm Lifestream & 13 mm x 5 cm Viabahn LEFT EIA   Peripheral neuropathy    Umbilical hernia     Past Surgical History:  Procedure Laterality Date   AMPUTATION Right 11/18/2022   Procedure: AMPUTATION 4TH AND 5TH RAY;  Surgeon: Linus Galas, [name];  Location: [facility];  Service: Orthopedics/Podiatry;  Laterality: Right;  4th and 5th toe   AMPUTATION Right 04/20/2023   Procedure: AMPUTATION BELOW KNEE;  Surgeon: Bertram Denver, MD;  Location: [facility];  Service: Vascular;  Laterality: Right;  block then general   APPLICATION OF WOUND VAC Right 01/09/2023   Procedure: APPLICATION OF WOUND VAC;  Surgeon: Annice Needy, MD;  Location: [facility];  Service: Vascular;  Laterality: Right;   CATARACT EXTRACTION W/PHACO Right 03/14/2021   Procedure: CATARACT EXTRACTION PHACO AND INTRAOCULAR LENS PLACEMENT (IOC) RIGHT DIABETIC;  Surgeon: Lockie Mola, MD;  Location: [facility];  Service: Ophthalmology;  Laterality: Right;  Diabetic 16.78 01:39.9   CATARACT EXTRACTION W/PHACO Left 03/28/2021   Procedure: CATARACT EXTRACTION PHACO AND INTRAOCULAR LENS PLACEMENT (IOC) LEFT DIABETIC 6.93 01:22.0;  Surgeon: Lockie Mola, MD;  Location: [facility];  Service: Ophthalmology;  Laterality: Left;  Diabetic   COLONOSCOPY     CORONARY ANGIOPLASTY WITH STENT PLACEMENT  03/2010   Procedure: CORONARY ANGIOPLASTY WITH STENT PLACEMENT; Location: [name]   ENDARTERECTOMY FEMORAL Bilateral 11/15/2022   Procedure: BILATERAL COMMON  FEMORAL PROFUNDA FEMORIS AND SUPERFICIAL FEMORAL ARTERY ENDARTECTOMIES, RIGHT FOGARTY EMBOLECTOMY OF THE RIGHT SFA  AND  POPLITEAL ARTERIES. AORTAGRAM AND RIGHT LOWER EXTREMITY ANGIOGRAM.;  Surgeon: Annice Needy, MD;  Location: [facility];  Service: Vascular;  Laterality: Bilateral;   INSERTION OF ILIAC STENT Bilateral 11/15/2022   Procedure: BILATERAL STENT INSERTION IN BILATERAL  COMMON ILIAC ARTERY, STENT INSERTION OF LEFT EXTERNAL ILIAC ARTERY. ANGIOPLASTY RIGHT TIBIAL  AND POPLITEAL ARTERY.;  Surgeon: Annice Needy, MD;  Location: [facility];  Service: Vascular;  Laterality: Bilateral;   IRRIGATION AND DEBRIDEMENT KNEE Right 04/21/2023   Procedure: IRRIGATION AND DEBRIDEMENT KNEE;  Surgeon: Campbell Stall, MD;  Location: [facility];  Service: Orthopedics;  Laterality: Right;   LOWER EXTREMITY ANGIOGRAPHY Right 05/14/2021   Procedure: LOWER EXTREMITY ANGIOGRAPHY;  Surgeon: Annice Needy, MD;  Location: [facility];  Service: Cardiovascular;  Laterality: Right;   LOWER EXTREMITY ANGIOGRAPHY Right 06/14/2021   Procedure: Lower Extremity Angiography;  Surgeon: Annice Needy, MD;  Location: [facility];  Service: Cardiovascular;  Laterality: Right;   LOWER EXTREMITY ANGIOGRAPHY Right 11/11/2022   Procedure: Lower Extremity Angiography;  Surgeon: Annice Needy, MD;  Location: [facility];  Service: Cardiovascular;  Laterality: Right;   LOWER EXTREMITY INTERVENTION Right 06/15/2021   Procedure: LOWER EXTREMITY INTERVENTION;  Surgeon: Annice Needy, MD;  Location: [facility];  Service: Cardiovascular;  Laterality: Right;   TONSILLECTOMY     TRANSMETATARSAL AMPUTATION Right 02/17/2023   Procedure: TRANSMETATARSAL AMPUTATION;  Surgeon: Rosetta Posner, [name];  Location: [facility];  Service: Orthopedics/Podiatry;  Laterality: Right;   WOUND DEBRIDEMENT Right 01/09/2023   Procedure: DEBRIDEMENT WOUND;  Surgeon: Annice Needy, MD;  Location: [facility];  Service: Vascular;   Laterality: Right;      No Known Allergies  Current Outpatient Medications  Medication Sig Dispense Refill   acetaminophen (TYLENOL) 325 MG tablet Take 2 tablets (650 mg total) by mouth every 6 (six) hours as needed for mild pain (pain score 1-3) (or Fever >/= 101).     aspirin EC 81 MG tablet Take 1 tablet (81 mg total) by mouth daily. 150 tablet 2   atorvastatin (LIPITOR) 80 MG tablet Take 1 tablet (80 mg total) by mouth daily. (Patient taking differently: Take 80 mg by mouth at bedtime.) 90 tablet 1   carvedilol (COREG) 25 MG tablet Take 25 mg by mouth 2 (two) times daily with a meal.     clopidogrel (PLAVIX) 75 MG tablet TAKE 1 TABLET(75 MG) BY MOUTH DAILY 90 tablet 1   DULoxetine (CYMBALTA) 20 MG capsule Take 1 capsule (20 mg total) by mouth 2 (two) times daily.     Ensure Max Protein (ENSURE MAX PROTEIN) LIQD Take 330 mLs (11 oz total) by mouth 2 (two) times daily. (Patient taking differently: Take 11 oz by mouth daily.) 10000 mL 3   FARXIGA 10 MG TABS tablet Take 10  mg by mouth daily.     folic acid (FOLVITE) 1 MG tablet Take 1 tablet (1 mg total) by mouth daily. 90 tablet 0   HYDROmorphone (DILAUDID) 2 MG tablet Take 1 tablet (2 mg total) by mouth every 6 (six) hours as needed for severe pain (pain score 7-10) or moderate pain (pain score 4-6). 120 tablet 0   insulin aspart (NOVOLOG) 100 UNIT/ML injection Inject 0-20 Units into the skin 3 (three) times daily with meals.     insulin aspart (NOVOLOG) 100 UNIT/ML injection Inject 0-5 Units into the skin at bedtime.     insulin glargine-yfgn (SEMGLEE) 100 UNIT/ML injection Inject 0.17 mLs (17 Units total) into the skin daily.     iron polysaccharides (NIFEREX) 150 MG capsule Take 1 capsule (150 mg total) by mouth daily. 90 capsule 0   ondansetron (ZOFRAN) 4 MG tablet Take 1 tablet (4 mg total) by mouth every 6 (six) hours as needed for nausea.     polyethylene glycol (MIRALAX / GLYCOLAX) 17 g packet Take 17 g by mouth daily as needed for  mild constipation. 14 each 0   pregabalin (LYRICA) 75 MG capsule Take 1 capsule (75 mg total) by mouth 2 (two) times daily. 60 capsule 0   sacubitril-valsartan (ENTRESTO) 24-26 MG Take 1 tablet by mouth every 12 (twelve) hours.     senna-docusate (SENOKOT-S) 8.6-50 MG tablet Take 1 tablet by mouth at bedtime as needed for mild constipation. 30 tablet 0   spironolactone (ALDACTONE) 50 MG tablet Take 1 tablet (50 mg total) by mouth daily. 30 tablet 0   tamsulosin (FLOMAX) 0.4 MG CAPS capsule Take 1 capsule (0.4 mg total) by mouth daily after supper.     thiamine (VITAMIN B-1) 100 MG tablet Take 1 tablet (100 mg total) by mouth daily. 90 tablet 0   traZODone (DESYREL) 100 MG tablet Take 1 tablet (100 mg total) by mouth at bedtime.     No current facility-administered medications for this visit.        Physical Exam There were no vitals taken for this visit. Gen:  WD/WN, NAD Skin: incision C/D/I Staples were removed without difficulty     Assessment/Plan: 1. Lower limb ischemia right leg with history of angioplasty and stent on 05/14/2021 (Primary) Patient is doing well and is ready for fitting for his prosthesis.  I will refer him to [facility].  He will follow-up in 3 months with an ABI of his left lower extremity. - VAS [place] ABI WITH/WO TBI; Future      Levora Dredge 05/31/2023, 2:51 PM   This note was created with Dragon medical transcription system.  Any errors from dictation are unintentional.

## 2023-06-02 ENCOUNTER — Ambulatory Visit (INDEPENDENT_AMBULATORY_CARE_PROVIDER_SITE_OTHER): Payer: Medicare Other | Admitting: Vascular Surgery

## 2023-06-02 ENCOUNTER — Encounter (INDEPENDENT_AMBULATORY_CARE_PROVIDER_SITE_OTHER): Payer: Self-pay | Admitting: Vascular Surgery

## 2023-06-02 VITALS — BP 144/85 | HR 76 | Resp 18 | Ht 68.0 in | Wt 184.0 lb

## 2023-06-02 DIAGNOSIS — I998 Other disorder of circulatory system: Secondary | ICD-10-CM

## 2023-06-19 ENCOUNTER — Telehealth (INDEPENDENT_AMBULATORY_CARE_PROVIDER_SITE_OTHER): Payer: Self-pay

## 2023-06-19 NOTE — Telephone Encounter (Signed)
 Bring him in to for a wound check, me or dew

## 2023-06-19 NOTE — Telephone Encounter (Signed)
 Manuel [name] called from [facility] stating that her patient has an opening on on his right amputated leg. She said it is oozing with drainage, but he doesn't have a fever, redness nor is it warm to touch. She feels he should have it looked at.   Please advise

## 2023-06-20 ENCOUNTER — Ambulatory Visit (INDEPENDENT_AMBULATORY_CARE_PROVIDER_SITE_OTHER): Payer: Medicare Other | Admitting: Vascular Surgery

## 2023-06-20 ENCOUNTER — Encounter (INDEPENDENT_AMBULATORY_CARE_PROVIDER_SITE_OTHER): Payer: Self-pay | Admitting: Vascular Surgery

## 2023-06-20 VITALS — BP 128/76 | HR 74 | Resp 18 | Ht 68.0 in | Wt 184.0 lb

## 2023-06-20 DIAGNOSIS — Z89511 Acquired absence of right leg below knee: Secondary | ICD-10-CM

## 2023-06-20 DIAGNOSIS — E119 Type 2 diabetes mellitus without complications: Secondary | ICD-10-CM

## 2023-06-20 DIAGNOSIS — I10 Essential (primary) hypertension: Secondary | ICD-10-CM

## 2023-06-20 DIAGNOSIS — I739 Peripheral vascular disease, unspecified: Secondary | ICD-10-CM

## 2023-06-20 DIAGNOSIS — Z794 Long term (current) use of insulin: Secondary | ICD-10-CM

## 2023-06-20 NOTE — Progress Notes (Signed)
 MRN : [id number]  Manuel [name] is a 78 y.o. ([birth year]) male who presents with chief complaint of  Chief Complaint  Patient presents with   Follow-up    fu wound ck  .  History of Present Illness: Patient returns today in follow up of his right below-knee amputation site.  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  He is doing quite well from this.  He has started the prosthesis process.  He is concerned today because he has 2 small openings and after wearing the stump shrinker he has had some serous drainage.  No erythema.  No purulent drainage.  Current Outpatient Medications  Medication Sig Dispense Refill   acetaminophen (TYLENOL) 325 MG tablet Take 2 tablets (650 mg total) by mouth every 6 (six) hours as needed for mild pain (pain score 1-3) (or Fever >/= 101).     aspirin EC 81 MG tablet Take 1 tablet (81 mg total) by mouth daily. 150 tablet 2   atorvastatin (LIPITOR) 80 MG tablet Take 1 tablet (80 mg total) by mouth daily. (Patient taking differently: Take 80 mg by mouth at bedtime.) 90 tablet 1   carvedilol (COREG) 25 MG tablet Take 25 mg by mouth 2 (two) times daily with a meal.     clopidogrel (PLAVIX) 75 MG tablet TAKE 1 TABLET(75 MG) BY MOUTH DAILY 90 tablet 1   DULoxetine (CYMBALTA) 20 MG capsule Take 1 capsule (20 mg total) by mouth 2 (two) times daily.     Ensure Max Protein (ENSURE MAX PROTEIN) LIQD Take 330 mLs (11 oz total) by mouth 2 (two) times daily. (Patient taking differently: Take 11 oz by mouth daily.) 10000 mL 3   FARXIGA 10 MG TABS tablet Take 10 mg by mouth daily.     ferrous sulfate 325 (65 FE) MG EC tablet Take by mouth.     folic acid (FOLVITE) 1 MG tablet Take 1 tablet (1 mg total) by mouth daily. 90 tablet 0   insulin aspart (NOVOLOG) 100 UNIT/ML injection Inject 0-20 Units into the skin 3 (three) times daily with meals.     insulin aspart (NOVOLOG) 100 UNIT/ML injection Inject 0-5 Units into the  skin at bedtime.     insulin glargine-yfgn (SEMGLEE) 100 UNIT/ML injection Inject 0.17 mLs (17 Units total) into the skin daily.     methocarbamol (ROBAXIN) 500 MG tablet Take by mouth.     mirtazapine (REMERON) 15 MG tablet Take 1 tablet by mouth at bedtime.     ondansetron (ZOFRAN) 4 MG tablet Take 1 tablet (4 mg total) by mouth every 6 (six) hours as needed for nausea.     polyethylene glycol (MIRALAX / GLYCOLAX) 17 g packet Take 17 g by mouth daily as needed for mild constipation. 14 each 0   pregabalin (LYRICA) 75 MG capsule Take 1 capsule (75 mg total) by mouth 2 (two) times daily. 60 capsule 0   sacubitril-valsartan (ENTRESTO) 24-26 MG Take 1 tablet by mouth every 12 (twelve) hours.     senna-docusate (SENOKOT-S) 8.6-50 MG tablet Take 1 tablet by mouth at bedtime as needed for mild constipation. 30 tablet 0   spironolactone (ALDACTONE) 50 MG tablet Take 1 tablet (50 mg total) by mouth daily. 30 tablet 0   tamsulosin (FLOMAX) 0.4 MG CAPS capsule Take 1 capsule (0.4 mg total) by mouth daily after supper.     thiamine (VITAMIN B-1) 100 MG tablet Take 1 tablet (100  mg total) by mouth daily. 90 tablet 0   traZODone (DESYREL) 100 MG tablet Take 1 tablet (100 mg total) by mouth at bedtime.     iron polysaccharides (NIFEREX) 150 MG capsule Take 1 capsule (150 mg total) by mouth daily. 90 capsule 0   No current facility-administered medications for this visit.    Past Medical History:  Diagnosis Date   Acute ST elevation myocardial infarction (STEMI) of inferior wall (HCC) 04/19/2010   a.) trans[facility] from [facility] --> LHC/PCI (very difficult procedure) --> 3.0 x 23 mm and 3.0 x 12 mm Xience stents to RCA   Allergies    Arthritis    Benign essential hypertension    Bilateral carotid artery disease (HCC) 05/08/2021   a.) carotid doppler 05/08/2021: 1-39% BICA   CAD (coronary artery disease) 04/19/2010   a.) inferior STEMI 04/19/2010 --> LHC/PCI: 50-70% pD1, 80% pRI, 90/90/90% RCA  (overlapping 3.0 x 23 and 3.0 x 12 mm Xience DES); b.) MV 11/10/2018: fixed minimally reversible inferior perfusion defect   Cellulitis of foot    DDD (degenerative disc disease), cervical    Diabetes mellitus type 2, insulin dependent (HCC)    Diverticulosis    Full dentures    Gout    Hard of hearing    History of bilateral cataract extraction 2022   History of ETOH abuse    Hyperlipidemia    Ischemic cardiomyopathy 04/19/2010   a.) TTE 04/19/2010: 40%; b.) TTE 04/20/2014: EF >55%, mild RVE, triv PR, mild MR/TR, G1DD; c.) TTE 11/10/2018: EF 45%, inf HK, mild RVE, triv TR/PR, mild MR, G1DD; d.) TTE 11/14/2022: EF 25-30%, basal anteroseptal, apical lateral, apical septal, and apex AK, mid anterolateral HK, mild-mod MR/TR   Long term current use of aspirin    Long term current use of clopidogrel    Lumbar degenerative disc disease    Lumbar radiculopathy    Lumbar vertebral fracture (chronic superior endplate of L1)    NSTEMI (non-ST elevated myocardial infarction) (HCC) 01/15/2023   OSA (obstructive sleep apnea)    a.) unable to tolerate nocturnal PAP therapy   Peripheral artery disease (HCC)    a.) stenting 05/14/21: 12 mm x 12 cm LifeStent RIGHT dis SFA/prox pop; b.) s/p cath directed thrombolysis RIGHT SFA/pop 06/14/21; c.) s/p mech thrombectomy + stenting 06/15/21: 8 mm x 25cm & 8 mm x 7.5cm Viabahn; d.) s/p BILAT CFA, profunda femoris, SFA endarterectomies + fogarty embolectomy + stenting 11/15/22: 12mm x 58mm Lifestream BILAT CIAs, 14 mm x 6 cm Lifestream & 13 mm x 5 cm Viabahn LEFT EIA   Peripheral neuropathy    Umbilical hernia     Past Surgical History:  Procedure Laterality Date   AMPUTATION Right 11/18/2022   Procedure: AMPUTATION 4TH AND 5TH RAY;  Surgeon: Linus Galas, [name];  Location: [facility] ORS;  Service: Orthopedics/Podiatry;  Laterality: Right;  4th and 5th toe   AMPUTATION Right 04/20/2023   Procedure: AMPUTATION BELOW KNEE;  Surgeon: Bertram Denver, MD;  Location: [facility]  ORS;  Service: Vascular;  Laterality: Right;  block then general   APPLICATION OF WOUND VAC Right 01/09/2023   Procedure: APPLICATION OF WOUND VAC;  Surgeon: Annice Needy, MD;  Location: [facility] OR[facility];  Service: Vascular;  Laterality: Right;   CATARACT EXTRACTION W/PHACO Right 03/14/2021   Procedure: CATARACT EXTRACTION PHACO AND INTRAOCULAR LENS PLACEMENT (IOC) RIGHT DIABETIC;  Surgeon: Lockie Mola, MD;  Location[facility];  Service: Ophthalmology;  Laterality: Right;  Diabetic 16.78 01:39.9  CATARACT EXTRACTION W/PHACO Left 03/28/2021   Procedure: CATARACT EXTRACTION PHACO AND INTRAOCULAR LENS PLACEMENT (IOC) LEFT DIABETIC 6.93 01:22.0;  Surgeon: Lockie Mola, MD;  Location: [facility];  Service: Ophthalmology;  Laterality: Left;  Diabetic   COLONOSCOPY     CORONARY ANGIOPLASTY WITH STENT PLACEMENT  03/2010   Procedure: CORONARY ANGIOPLASTY WITH STENT PLACEMENT; Location: [name]   ENDARTERECTOMY FEMORAL Bilateral 11/15/2022   Procedure: BILATERAL COMMON FEMORAL PROFUNDA FEMORIS AND SUPERFICIAL FEMORAL ARTERY ENDARTECTOMIES, RIGHT FOGARTY EMBOLECTOMY OF THE RIGHT SFA  AND  POPLITEAL ARTERIES. AORTAGRAM AND RIGHT LOWER EXTREMITY ANGIOGRAM.;  Surgeon: Annice Needy, MD;  Location: [facility];  Service: Vascular;  Laterality: Bilateral;   INSERTION OF ILIAC STENT Bilateral 11/15/2022   Procedure: BILATERAL STENT INSERTION IN BILATERAL  COMMON ILIAC ARTERY, STENT INSERTION OF LEFT EXTERNAL ILIAC ARTERY. ANGIOPLASTY RIGHT TIBIAL  AND POPLITEAL ARTERY.;  Surgeon: Annice Needy, MD;  Location: [facility];  Service: Vascular;  Laterality: Bilateral;   IRRIGATION AND DEBRIDEMENT KNEE Right 04/21/2023   Procedure: IRRIGATION AND DEBRIDEMENT KNEE;  Surgeon: Campbell Stall, MD;  Location: [facility];  Service: Orthopedics;  Laterality: Right;   LOWER EXTREMITY ANGIOGRAPHY Right 05/14/2021   Procedure: LOWER EXTREMITY ANGIOGRAPHY;  Surgeon: Annice Needy, MD;  Location: [facility];  Service: Cardiovascular;  Laterality: Right;   LOWER EXTREMITY ANGIOGRAPHY Right 06/14/2021   Procedure: Lower Extremity Angiography;  Surgeon: Annice Needy, MD;  Location: [facility];  Service: Cardiovascular;  Laterality: Right;   LOWER EXTREMITY ANGIOGRAPHY Right 11/11/2022   Procedure: Lower Extremity Angiography;  Surgeon: Annice Needy, MD;  Location: [facility];  Service: Cardiovascular;  Laterality: Right;   LOWER EXTREMITY INTERVENTION Right 06/15/2021   Procedure: LOWER EXTREMITY INTERVENTION;  Surgeon: Annice Needy, MD;  Location: [facility];  Service: Cardiovascular;  Laterality: Right;   TONSILLECTOMY     TRANSMETATARSAL AMPUTATION Right 02/17/2023   Procedure: TRANSMETATARSAL AMPUTATION;  Surgeon: Rosetta Posner, [name];  Location: [facility];  Service: Orthopedics/Podiatry;  Laterality: Right;   WOUND DEBRIDEMENT Right 01/09/2023   Procedure: DEBRIDEMENT WOUND;  Surgeon: Annice Needy, MD;  Location: [facility];  Service: Vascular;  Laterality: Right;     Social History   Tobacco Use   Smoking status: Never   Smokeless tobacco: Never  Vaping Use   Vaping status: Never Used  Substance Use Topics   Alcohol use: Yes    Alcohol/week: 21.0 standard drinks of alcohol    Types: 7 Glasses of wine, 14 Cans of beer per week    Comment: occassional   Drug use: No       Family History  Problem Relation Age of Onset   Scoliosis Mother    Heart disease Father      No Known Allergies   REVIEW OF SYSTEMS (Negative unless checked)  Constitutional: [] Weight loss  [] Fever  [] Chills Cardiac: [] Chest pain   [] Chest pressure   [] Palpitations   [] Shortness of breath when laying flat   [] Shortness of breath at rest   [x] Shortness of breath with exertion. Vascular:  [] Pain in legs with walking   [] Pain in legs at rest   [] Pain in legs when laying flat   [] Claudication   [] Pain in feet when walking  [] Pain in feet at rest  [] Pain in feet when laying flat    [] History of DVT   [] Phlebitis   [x] Swelling in legs   [] Varicose veins   [] Non-healing ulcers Pulmonary:   []   Uses home oxygen   [] Productive cough   [] Hemoptysis   [] Wheeze  [] COPD   [] Asthma Neurologic:  [] Dizziness  [] Blackouts   [] Seizures   [] History of stroke   [] History of TIA  [] Aphasia   [] Temporary blindness   [] Dysphagia   [] Weakness or numbness in arms   [] Weakness or numbness in legs Musculoskeletal:  [x] Arthritis   [] Joint swelling   [x] Joint pain   [x] Low back pain Hematologic:  [] Easy bruising  [] Easy bleeding   [] Hypercoagulable state   [] Anemic   Gastrointestinal:  [] Blood in stool   [] Vomiting blood  [] Gastroesophageal reflux/heartburn   [] Abdominal pain Genitourinary:  [] Chronic kidney disease   [] Difficult urination  [] Frequent urination  [] Burning with urination   [] Hematuria Skin:  [] Rashes   [x] Ulcers   [x] Wounds Psychological:  [] History of anxiety   []  History of major depression.  Physical Examination  BP 128/76   Pulse 74   Resp 18   Ht 5\' 8"  (1.727 m)   Wt 184 lb (83.5 kg)   BMI 27.98 kg/m  Gen:  WD/WN, NAD Head: [place]/AT, No temporalis wasting. Ear/Nose/Throat: Hearing grossly intact, nares w/o erythema or drainage Eyes: Conjunctiva clear. Sclera non-icteric Neck: Supple.  Trachea midline Pulmonary:  Good air movement, no use of accessory muscles.  Cardiac: irregular Vascular:  Vessel Right Left  Radial Palpable Palpable                          PT Not Palpable Not Palpable  DP Not Palpable 1+ Palpable   Gastrointestinal: soft, non-tender/non-distended. No guarding/reflex.  Musculoskeletal: M/S 5/5 throughout.  No deformity or atrophy.  Right BKA with 2 areas of less than 1 cm circular wounds with some serous drainage.  No erythema.  No foul odor.  There is a little swelling in the stump.  Left leg is warm with good capillary refill and trace lower extremity edema. Neurologic: Sensation grossly intact in extremities.  Symmetrical.  Speech is  fluent.  Psychiatric: Judgment intact, Mood & affect appropriate for pt's clinical situation.       Labs Recent Results (from the past 2160 hours)  CBC     Status: Abnormal   Collection Time: 04/15/23  2:32 PM  Result Value Ref Range   WBC 11.6 (H) 4.0 - 10.5 K/uL   RBC 4.13 (L) 4.22 - 5.81 MIL/uL   Hemoglobin 12.1 (L) 13.0 - 17.0 g/dL   HCT 11.9 (L) 14.7 - 82.9 %   MCV 85.0 80.0 - 100.0 fL   MCH 29.3 26.0 - 34.0 pg   MCHC 34.5 30.0 - 36.0 g/dL   RDW 56.2 (H) 13.0 - 86.5 %   Platelets 233 150 - 400 K/uL   nRBC 0.0 0.0 - 0.2 %    Comment: Performed at [facility], [address], [place], [place] [address]  Basic metabolic panel     Status: Abnormal   Collection Time: 04/15/23  2:32 PM  Result Value Ref Range   Sodium 126 (L) 135 - 145 mmol/L   Potassium 5.1 3.5 - 5.1 mmol/L   Chloride 92 (L) 98 - 111 mmol/L   CO2 21 (L) 22 - 32 mmol/L   Glucose, Bld 215 (H) 70 - 99 mg/dL    Comment: Glucose reference range applies only to samples taken after fasting for at least 8 hours.   BUN 48 (H) 8 - 23 mg/dL   Creatinine, Ser 6.29 (H) 0.61 - 1.24 mg/dL  Calcium 8.8 (L) 8.9 - 10.3 mg/dL   GFR, Estimated 47 (L) >60 mL/min    Comment: (NOTE) Calculated using the CKD-EPI Creatinine Equation (2021)    Anion gap 13 5 - 15    Comment: Performed at [facility], [address] [place], [place] [address]  Lactic acid, plasma     Status: None   Collection Time: 04/15/23  2:32 PM  Result Value Ref Range   Lactic Acid, Venous 0.8 0.5 - 1.9 mmol/L    Comment: Performed at [facility], [address] [place], [place] [address]  Lactic acid, plasma     Status: None   Collection Time: 04/18/23  5:58 PM  Result Value Ref Range   Lactic Acid, Venous 1.1 0.5 - 1.9 mmol/L    Comment: Performed at [facility], [address] [place], [place] [address]  Urinalysis, Routine w reflex microscopic -Urine, Clean Catch     Status: Abnormal   Collection  Time: 04/18/23  5:58 PM  Result Value Ref Range   Color, Urine STRAW (A) YELLOW   APPearance CLEAR (A) CLEAR   Specific Gravity, Urine 1.005 1.005 - 1.030   pH 5.0 5.0 - 8.0   Glucose, UA >=500 (A) NEGATIVE mg/dL   Hgb urine dipstick SMALL (A) NEGATIVE   Bilirubin Urine NEGATIVE NEGATIVE   Ketones, ur NEGATIVE NEGATIVE mg/dL   Protein, ur NEGATIVE NEGATIVE mg/dL   Nitrite NEGATIVE NEGATIVE   Leukocytes,Ua NEGATIVE NEGATIVE   RBC / HPF 0 0 - 5 RBC/hpf   WBC, UA 0-5 0 - 5 WBC/hpf   Bacteria, UA NONE SEEN NONE SEEN   Squamous Epithelial / HPF 0-5 0 - 5 /HPF   Hyaline Casts, UA PRESENT     Comment: Performed at [facility], [address] [place], [place] [address]  Osmolality, urine     Status: Abnormal   Collection Time: 04/18/23  5:58 PM  Result Value Ref Range   Osmolality, Ur 201 (L) 300 - 900 mOsm/kg    Comment: REPEATED TO VERIFY Performed at [facility], [address] [place], [place] [address]   Na and K (sodium & potassium), rand urine     Status: None   Collection Time: 04/18/23  5:58 PM  Result Value Ref Range   Sodium, Ur 18 mmol/L   Potassium Urine 13 mmol/L    Comment: Performed at [facility], [address] [place], [place] [address]  Comprehensive metabolic panel     Status: Abnormal   Collection Time: 04/18/23  5:59 PM  Result Value Ref Range   Sodium 123 (L) 135 - 145 mmol/L   Potassium 5.0 3.5 - 5.1 mmol/L   Chloride 90 (L) 98 - 111 mmol/L   CO2 21 (L) 22 - 32 mmol/L   Glucose, Bld 249 (H) 70 - 99 mg/dL    Comment: Glucose reference range applies only to samples taken after fasting for at least 8 hours.   BUN 71 (H) 8 - 23 mg/dL   Creatinine, Ser 0.10 (H) 0.61 - 1.24 mg/dL   Calcium 8.7 (L) 8.9 - 10.3 mg/dL   Total Protein 7.2 6.5 - 8.1 g/dL   Albumin 3.2 (L) 3.5 - 5.0 g/dL   AST 55 (H) 15 - 41 U/L   ALT 48 (H) 0 - 44 U/L   Alkaline Phosphatase 112 38 - 126 U/L   Total Bilirubin 0.6 <1.2 mg/dL   GFR, Estimated 34  (L) >60 mL/min  Comment: (NOTE) Calculated using the CKD-EPI Creatinine Equation (2021)    Anion gap 12 5 - 15    Comment: Performed at [facility], [address] [place], [place] [address]  CBC     Status: Abnormal   Collection Time: 04/18/23  5:59 PM  Result Value Ref Range   WBC 16.5 (H) 4.0 - 10.5 K/uL   RBC 4.07 (L) 4.22 - 5.81 MIL/uL   Hemoglobin 11.7 (L) 13.0 - 17.0 g/dL   HCT 60.4 (L) 54.0 - 98.1 %   MCV 83.5 80.0 - 100.0 fL   MCH 28.7 26.0 - 34.0 pg   MCHC 34.4 30.0 - 36.0 g/dL   RDW 19.1 (H) 47.8 - 29.5 %   Platelets 296 150 - 400 K/uL   nRBC 0.0 0.0 - 0.2 %    Comment: Performed at [facility], [address] [place], [place] [address]  Blood culture (routine x 2)     Status: None   Collection Time: 04/19/23  1:50 AM   Specimen: BLOOD  Result Value Ref Range   Specimen Description BLOOD BLOOD LEFT ARM    Special Requests      BOTTLES DRAWN AEROBIC AND ANAEROBIC Blood Culture results may not be optimal due to an inadequate volume of blood received in culture bottles   Culture      NO GROWTH 5 DAYS Performed at [facility], [address] [place], [place] [address]    Report Status 04/24/2023 FINAL   Procalcitonin     Status: None   Collection Time: 04/19/23  1:50 AM  Result Value Ref Range   Procalcitonin 2.13 ng/mL    Comment:        Interpretation: PCT > 2 ng/mL: Systemic infection (sepsis) is likely, unless other causes are known. (NOTE)       Sepsis PCT Algorithm           Lower Respiratory Tract                                      Infection PCT Algorithm    ----------------------------     ----------------------------         PCT < 0.25 ng/mL                PCT < 0.10 ng/mL          Strongly encourage             Strongly discourage   discontinuation of antibiotics    initiation of antibiotics    ----------------------------     -----------------------------       PCT 0.25 - 0.50 ng/mL            PCT 0.10 - 0.25  ng/mL               OR       >80% decrease in PCT            Discourage initiation of                                            antibiotics      Encourage discontinuation           of antibiotics    ----------------------------     -----------------------------  PCT >= 0.50 ng/mL              PCT 0.26 - 0.50 ng/mL               AND       <80% decrease in PCT              Encourage initiation of                                             antibiotics       Encourage continuation           of antibiotics    ----------------------------     -----------------------------        PCT >= 0.50 ng/mL                  PCT > 0.50 ng/mL               AND         increase in PCT                  Strongly encourage                                      initiation of antibiotics    Strongly encourage escalation           of antibiotics                                     -----------------------------                                           PCT <= 0.25 ng/mL                                                 OR                                        > 80% decrease in PCT                                      Discontinue / Do not initiate                                             antibiotics  Performed at [facility], [address] [place], [place] [address]   Blood culture (routine x 2)     Status: None   Collection Time: 04/19/23  1:56 AM   Specimen: BLOOD  Result Value Ref Range   Specimen Description BLOOD BLOOD RIGHT ARM    Special Requests      BOTTLES DRAWN AEROBIC AND  ANAEROBIC Blood Culture adequate volume   Culture      NO GROWTH 5 DAYS Performed at [facility], [address] [place] [place], [place] [address]    Report Status 04/24/2023 FINAL   Protime-INR     Status: Abnormal   Collection Time: 04/19/23  4:42 AM  Result Value Ref Range   Prothrombin Time 17.4 (H) 11.4 - 15.2 seconds   INR 1.4 (H) 0.8 - 1.2    Comment: (NOTE) INR goal varies based  on device and disease states. Performed at [facility], [address] [place], [place] [address]   Cortisol-am, blood     Status: None   Collection Time: 04/19/23  4:42 AM  Result Value Ref Range   Cortisol - AM 13.1 6.7 - 22.6 ug/dL    Comment: Performed at [facility], [address] [place], [place] [address]  Basic metabolic panel     Status: Abnormal   Collection Time: 04/19/23  4:42 AM  Result Value Ref Range   Sodium 126 (L) 135 - 145 mmol/L   Potassium 4.8 3.5 - 5.1 mmol/L   Chloride 98 98 - 111 mmol/L   CO2 18 (L) 22 - 32 mmol/L   Glucose, Bld 190 (H) 70 - 99 mg/dL    Comment: Glucose reference range applies only to samples taken after fasting for at least 8 hours.   BUN 67 (H) 8 - 23 mg/dL   Creatinine, Ser 9.56 (H) 0.61 - 1.24 mg/dL   Calcium 8.1 (L) 8.9 - 10.3 mg/dL   GFR, Estimated 40 (L) >60 mL/min    Comment: (NOTE) Calculated using the CKD-EPI Creatinine Equation (2021)    Anion gap 10 5 - 15    Comment: Performed at [facility], [address] [place], [place] [address]  CBC     Status: Abnormal   Collection Time: 04/19/23  4:42 AM  Result Value Ref Range   WBC 15.6 (H) 4.0 - 10.5 K/uL   RBC 3.62 (L) 4.22 - 5.81 MIL/uL   Hemoglobin 10.3 (L) 13.0 - 17.0 g/dL   HCT 65.7 (L) 84.6 - 96.2 %   MCV 83.1 80.0 - 100.0 fL   MCH 28.5 26.0 - 34.0 pg   MCHC 34.2 30.0 - 36.0 g/dL   RDW 95.2 (H) 84.1 - 32.4 %   Platelets 293 150 - 400 K/uL   nRBC 0.0 0.0 - 0.2 %    Comment: Performed at [facility], [address] [place], [place] [address]  TSH     Status: None   Collection Time: 04/19/23  4:42 AM  Result Value Ref Range   TSH 1.416 0.350 - 4.500 uIU/mL    Comment: Performed by a 3rd Generation assay with a functional sensitivity of <=0.01 uIU/mL. Performed at [facility], [address] [place], [place] [address]   Osmolality     Status: None   Collection Time: 04/19/23  4:42 AM  Result Value Ref Range    Osmolality 295 275 - 295 mOsm/kg    Comment: REPEATED TO VERIFY Performed at [facility], [address] [place], [place] [address]   CBG monitoring, ED     Status: Abnormal   Collection Time: 04/19/23  7:51 AM  Result Value Ref Range   Glucose-Capillary 152 (H) 70 - 99 mg/dL    Comment: Glucose reference range applies only to samples taken after fasting for at least 8 hours.  Aerobic/Anaerobic Culture w Gram Stain (  surgical/deep wound)     Status: None   Collection Time: 04/19/23  7:56 AM   Specimen: Foot  Result Value Ref Range   Specimen Description      FOOT Performed at [facility], [address], [place], [place] [address]    Special Requests      NONE Performed at [facility], [address] [place], [place] [address]    Gram Stain NO WBC SEEN RARE GRAM POSITIVE COCCI     Culture      MODERATE STAPHYLOCOCCUS AUREUS FEW DIPHTHEROIDS(CORYNEBACTERIUM SPECIES) Standardized susceptibility testing for this organism is not available. NO ANAEROBES ISOLATED Performed at [facility], [address], [place], [place] [address]    Report Status 04/24/2023 FINAL    Organism ID, Bacteria STAPHYLOCOCCUS AUREUS       Susceptibility   Staphylococcus aureus - MIC*    CIPROFLOXACIN >=8 RESISTANT Resistant     ERYTHROMYCIN >=8 RESISTANT Resistant     GENTAMICIN <=0.5 SENSITIVE Sensitive     OXACILLIN <=0.25 SENSITIVE Sensitive     TETRACYCLINE >=16 RESISTANT Resistant     VANCOMYCIN 1 SENSITIVE Sensitive     TRIMETH/SULFA <=10 SENSITIVE Sensitive     CLINDAMYCIN >=8 RESISTANT Resistant     RIFAMPIN <=0.5 SENSITIVE Sensitive     Inducible Clindamycin NEGATIVE Sensitive     LINEZOLID 1 SENSITIVE Sensitive     * MODERATE STAPHYLOCOCCUS AUREUS  CBG monitoring, ED     Status: Abnormal   Collection Time: 04/19/23 11:45 AM  Result Value Ref Range   Glucose-Capillary 157 (H) 70 - 99 mg/dL    Comment: Glucose reference range applies only to  samples taken after fasting for at least 8 hours.  Basic metabolic panel     Status: Abnormal   Collection Time: 04/19/23  5:45 PM  Result Value Ref Range   Sodium 129 (L) 135 - 145 mmol/L   Potassium 4.8 3.5 - 5.1 mmol/L   Chloride 101 98 - 111 mmol/L   CO2 16 (L) 22 - 32 mmol/L   Glucose, Bld 139 (H) 70 - 99 mg/dL    Comment: Glucose reference range applies only to samples taken after fasting for at least 8 hours.   BUN 61 (H) 8 - 23 mg/dL   Creatinine, Ser 9.14 (H) 0.61 - 1.24 mg/dL   Calcium 8.3 (L) 8.9 - 10.3 mg/dL   GFR, Estimated 46 (L) >60 mL/min    Comment: (NOTE) Calculated using the CKD-EPI Creatinine Equation (2021)    Anion gap 12 5 - 15    Comment: Performed at [facility], [address] [place], [place] [address]  CBG monitoring, ED     Status: Abnormal   Collection Time: 04/19/23  6:04 PM  Result Value Ref Range   Glucose-Capillary 144 (H) 70 - 99 mg/dL    Comment: Glucose reference range applies only to samples taken after fasting for at least 8 hours.  Type and screen     Status: None   Collection Time: 04/19/23  6:50 PM  Result Value Ref Range   ABO/RH(D) A POS    Antibody Screen NEG    Sample Expiration      04/22/2023,2359 Performed at [facility], [address] [place], [place] [address]   Glucose, capillary     Status: Abnormal   Collection Time: 04/19/23  9:44 PM  Result Value Ref Range   Glucose-Capillary 153 (H) 70 - 99 mg/dL    Comment:  Glucose reference range applies only to samples taken after fasting for at least 8 hours.  Surgical pathology     Status: None   Collection Time: 04/20/23 12:00 AM  Result Value Ref Range   SURGICAL PATHOLOGY      SURGICAL PATHOLOGY [facility] [address] [place], [place] [address] Telephone [phone] or [phone] Fax [phone]  REPORT OF SURGICAL PATHOLOGY   Accession #: [id number] Patient Name: SAKIB, NOGUEZ Visit # :  [id number]  MRN: [id number] Physician: Britt Boozer [birth year] (Age: 59) Gender: M Collected Date: 04/20/2023 Received Date: 04/21/2023  FINAL DIAGNOSIS       1. Leg, amputation, Right BKA :       - BENIGN SKIN AND SOFT TISSUE WITH ACUTE INFLAMMATION, ULCER, AND NECROSIS      - REACTIVE BONE WITH ACUTE OSTEOMYELITIS      - MODERATE CALCIFIC ATHEROSCLEROSIS      - CHANGES CONSISTENT WITH PRIOR SURGICAL PROCEDURE      - BONE MARGIN IS NEGATIVE FOR ACUTE OSTEOMYELITIS       DATE SIGNED OUT: 04/29/2023 ELECTRONIC SIGNATURE : Corey Harold M.D., Dossie Arbour., Pathologist, Electronic Signature  MICROSCOPIC DESCRIPTION  CASE COMMENTS STAINS USED IN DIAGNOSIS: H&E H&E H&E H&E H&E H&E    CLIN ICAL HISTORY  SPECIMEN(S) OBTAINED 1. Leg, amputation, Right BKA  SPECIMEN COMMENTS: SPECIMEN CLINICAL INFORMATION: 1. Acute osteomyelitis    Gross Description 1. Specimen: Right lower leg, below-the-knee amputation      Margin: Grossly viable; partially calcified vasculature, up to 90% stenotic;      two firm, flat bone surfaces (tibia and fibula; cortical rim up to 0.8 cm and      0.4 cm, respectively); soft, red-brown muscle with a mild amount of edematous      adipose tissue.      Lower leg: 36.5 cm long, 25.8 cm circumference      Lesion: Anterior shin; 2.5 x 1.5 cm ill-defined, tan-yellow, softened area with      mild sloughing and a pink-tan rim; grossly superficial and does not involve the      underlying bone.      Cut surfaces: Moderately edematous; the anterior muscle is pale pink and mildly      edematous; the posterior muscle is red-brown and unremarkable.      Foot: 17.2 cm long, 9.0 cm wide      Toes: Previously amputated; 7.8 cm long, partially h ealed scar with a 4.0 cm      long, gray-pink diffusely softened area that extends up to 2.0 cm deep; the      underlying bone is grossly uninvolved.      Skin (uninvolved): Tan-white and mildly wrinkled.       Block summary:      1A: Vasculature - margin (en face)      1B: Vasculature - additional representative      1C: Bone- margin (en face, representative), following decalcification      1D: Lower leg - lesion (representative)      1E: Lower leg - bone underlying lesion, following decalcification      68F: Foot - previous amputation site (representative)      AMG 04/21/2023        Report signed out from the following location(s) [place] [address], [place] [address] CLIA #: [id number]  [facility] [address] [place], [place] [address] CLIA #: [id number]  Surgical PCR screen     Status: None   Collection Time: 04/20/23  1:07 AM   Specimen: Nasal Mucosa; Nasal Swab  Result Value Ref Range   MRSA, PCR NEGATIVE NEGATIVE   Staphylococcus aureus NEGATIVE NEGATIVE    Comment: (NOTE) The Xpert SA Assay (FDA approved for NASAL specimens in patients 44 years of age and older), is one component of a comprehensive surveillance program. It is not intended to diagnose infection nor to guide or monitor treatment. Performed at [facility], [address] [place], [place] [address]   Basic metabolic panel     Status: Abnormal   Collection Time: 04/20/23  4:24 AM  Result Value Ref Range   Sodium 130 (L) 135 - 145 mmol/L   Potassium 4.6 3.5 - 5.1 mmol/L   Chloride 102 98 - 111 mmol/L   CO2 19 (L) 22 - 32 mmol/L   Glucose, Bld 81 70 - 99 mg/dL    Comment: Glucose reference range applies only to samples taken after fasting for at least 8 hours.   BUN 50 (H) 8 - 23 mg/dL   Creatinine, Ser 6.04 0.61 - 1.24 mg/dL   Calcium 8.3 (L) 8.9 - 10.3 mg/dL   GFR, Estimated >54 >09 mL/min    Comment: (NOTE) Calculated using the CKD-EPI Creatinine Equation (2021)    Anion gap 9 5 - 15    Comment: Performed at [facility], [address] [place], [place] [address]  Vancomycin, random     Status: None   Collection Time:  04/20/23  4:24 AM  Result Value Ref Range   Vancomycin Rm 11 ug/mL    Comment:        Random Vancomycin therapeutic range is dependent on dosage and time of specimen collection. A peak range is 20-40 ug/mL A trough range is 5-15 ug/mL        Performed at [facility], [address] [place], [place] [address]   Glucose, capillary     Status: None   Collection Time: 04/20/23  7:48 AM  Result Value Ref Range   Glucose-Capillary 90 70 - 99 mg/dL    Comment: Glucose reference range applies only to samples taken after fasting for at least 8 hours.  Glucose, capillary     Status: None   Collection Time: 04/20/23  9:46 AM  Result Value Ref Range   Glucose-Capillary 80 70 - 99 mg/dL    Comment: Glucose reference range applies only to samples taken after fasting for at least 8 hours.   Comment 1 Notify RN    Comment 2 Document in Chart   Glucose, capillary     Status: None   Collection Time: 04/20/23 12:05 PM  Result Value Ref Range   Glucose-Capillary 77 70 - 99 mg/dL    Comment: Glucose reference range applies only to samples taken after fasting for at least 8 hours.  Synovial cell count + diff, w/ crystals     Status: Abnormal   Collection Time: 04/20/23  3:00 PM  Result Value Ref Range   Color, Synovial PINK (A) YELLOW   Appearance-Synovial TURBID (A) CLEAR   Crystals, Fluid INTRACELLULAR MONOSODIUM URATE CRYSTALS    WBC, Synovial 124,737 (H) 0 - 200 /cu mm   Neutrophil, Synovial 95 %   Lymphocytes-Synovial Fld 2 %   Monocyte-Macrophage-Synovial Fluid 3 %   Eosinophils-Synovial 0 %    Comment: Performed at [facility], [address] [place], [place] [address]  Body  fluid culture w Gram Stain     Status: None   Collection Time: 04/20/23  3:00 PM   Specimen: Synovium; Body Fluid  Result Value Ref Range   Specimen Description      SYNOVIAL Performed at [facility], [address] [place], [place] [address]    Special Requests      RIGHT  KNEE Performed at [facility], [address] [place], [place] [address]    Gram Stain      CORRECTED RESULTS NO ORGANISMS SEEN PREVIOUSLY REPORTED AS: MODERATE GRAM POSITIVE COCCI CORRECTED RESULTS CALLED TO: DR Rivka Safer [phone] FCP ABUNDANT WBC PRESENT, PREDOMINANTLY PMN    Culture      NO GROWTH 3 DAYS Performed at [facility], [address], [place], [place] [address]    Report Status 04/24/2023 FINAL   Glucose, capillary     Status: Abnormal   Collection Time: 04/20/23  5:18 PM  Result Value Ref Range   Glucose-Capillary 256 (H) 70 - 99 mg/dL    Comment: Glucose reference range applies only to samples taken after fasting for at least 8 hours.  Glucose, capillary     Status: Abnormal   Collection Time: 04/20/23  9:47 PM  Result Value Ref Range   Glucose-Capillary 214 (H) 70 - 99 mg/dL    Comment: Glucose reference range applies only to samples taken after fasting for at least 8 hours.  Creatinine, serum     Status: None   Collection Time: 04/21/23  4:31 AM  Result Value Ref Range   Creatinine, Ser 0.84 0.61 - 1.24 mg/dL   GFR, Estimated >84 >13 mL/min    Comment: (NOTE) Calculated using the CKD-EPI Creatinine Equation (2021) Performed at [facility], [address] [place] [address] [place], [place] [address]   Glucose, capillary     Status: Abnormal   Collection Time: 04/21/23  7:32 AM  Result Value Ref Range   Glucose-Capillary 189 (H) 70 - 99 mg/dL    Comment: Glucose reference range applies only to samples taken after fasting for at least 8 hours.  Glucose, capillary     Status: Abnormal   Collection Time: 04/21/23  8:29 AM  Result Value Ref Range   Glucose-Capillary 180 (H) 70 - 99 mg/dL    Comment: Glucose reference range applies only to samples taken after fasting for at least 8 hours.  Aerobic/Anaerobic Culture w Gram Stain (surgical/deep wound)     Status: None   Collection Time: 04/21/23  9:49 AM   Specimen: Path fluid; Body Fluid   Result Value Ref Range   Specimen Description      FLUID Performed at [facility], [address] [place], [place] [address]    Special Requests SWAB OF RIGHT KNEE SYNOVIAL FLUID    Gram Stain      FEW WBC PRESENT, PREDOMINANTLY PMN NO ORGANISMS SEEN    Culture      No growth aerobically or anaerobically. Performed at [facility], [address], [place], [place] [address]    Report Status 04/30/2023 FINAL   Glucose, capillary     Status: Abnormal   Collection Time: 04/21/23 10:24 AM  Result Value Ref Range   Glucose-Capillary 167 (H) 70 - 99 mg/dL    Comment: Glucose reference range applies only to samples taken after fasting for at least 8 hours.  Glucose, capillary     Status: Abnormal   Collection Time: 04/21/23 12:53 PM  Result Value  Ref Range   Glucose-Capillary 182 (H) 70 - 99 mg/dL    Comment: Glucose reference range applies only to samples taken after fasting for at least 8 hours.  CBC with Differential/Platelet     Status: Abnormal   Collection Time: 04/21/23  1:24 PM  Result Value Ref Range   WBC 13.9 (H) 4.0 - 10.5 K/uL   RBC 2.84 (L) 4.22 - 5.81 MIL/uL   Hemoglobin 8.1 (L) 13.0 - 17.0 g/dL   HCT 16.1 (L) 09.6 - 04.5 %   MCV 85.9 80.0 - 100.0 fL   MCH 28.5 26.0 - 34.0 pg   MCHC 33.2 30.0 - 36.0 g/dL   RDW 40.9 (H) 81.1 - 91.4 %   Platelets 336 150 - 400 K/uL   nRBC 0.0 0.0 - 0.2 %   Neutrophils Relative % 72 %   Neutro Abs 9.9 (H) 1.7 - 7.7 K/uL   Lymphocytes Relative 16 %   Lymphs Abs 2.2 0.7 - 4.0 K/uL   Monocytes Relative 10 %   Monocytes Absolute 1.4 (H) 0.1 - 1.0 K/uL   Eosinophils Relative 1 %   Eosinophils Absolute 0.2 0.0 - 0.5 K/uL   Basophils Relative 0 %   Basophils Absolute 0.0 0.0 - 0.1 K/uL   Immature Granulocytes 1 %   Abs Immature Granulocytes 0.13 (H) 0.00 - 0.07 K/uL    Comment: Performed at [facility], [address] [place], [place] [address]  Basic metabolic panel     Status: Abnormal   Collection  Time: 04/21/23  1:24 PM  Result Value Ref Range   Sodium 131 (L) 135 - 145 mmol/L   Potassium 5.0 3.5 - 5.1 mmol/L   Chloride 104 98 - 111 mmol/L   CO2 18 (L) 22 - 32 mmol/L   Glucose, Bld 182 (H) 70 - 99 mg/dL    Comment: Glucose reference range applies only to samples taken after fasting for at least 8 hours.   BUN 36 (H) 8 - 23 mg/dL   Creatinine, Ser 6.21 0.61 - 1.24 mg/dL   Calcium 8.0 (L) 8.9 - 10.3 mg/dL   GFR, Estimated >30 >86 mL/min    Comment: (NOTE) Calculated using the CKD-EPI Creatinine Equation (2021)    Anion gap 9 5 - 15    Comment: Performed at [facility], [address] [place], [place] [address]  Glucose, capillary     Status: Abnormal   Collection Time: 04/21/23  4:24 PM  Result Value Ref Range   Glucose-Capillary 178 (H) 70 - 99 mg/dL    Comment: Glucose reference range applies only to samples taken after fasting for at least 8 hours.  Glucose, capillary     Status: Abnormal   Collection Time: 04/21/23  8:42 PM  Result Value Ref Range   Glucose-Capillary 122 (H) 70 - 99 mg/dL    Comment: Glucose reference range applies only to samples taken after fasting for at least 8 hours.  CBC with Differential/Platelet     Status: Abnormal   Collection Time: 04/22/23  4:56 AM  Result Value Ref Range   WBC 11.3 (H) 4.0 - 10.5 K/uL   RBC 2.88 (L) 4.22 - 5.81 MIL/uL   Hemoglobin 8.2 (L) 13.0 - 17.0 g/dL   HCT 96.2 (L) 95.2 - 84.1 %   MCV 83.3 80.0 - 100.0 fL   MCH 28.5 26.0 - 34.0 pg   MCHC 34.2 30.0 - 36.0 g/dL   RDW 32.4 (H) 40.1 - 02.7 %  Platelets 366 150 - 400 K/uL   nRBC 0.0 0.0 - 0.2 %   Neutrophils Relative % 67 %   Neutro Abs 7.5 1.7 - 7.7 K/uL   Lymphocytes Relative 19 %   Lymphs Abs 2.2 0.7 - 4.0 K/uL   Monocytes Relative 11 %   Monocytes Absolute 1.2 (H) 0.1 - 1.0 K/uL   Eosinophils Relative 2 %   Eosinophils Absolute 0.3 0.0 - 0.5 K/uL   Basophils Relative 0 %   Basophils Absolute 0.0 0.0 - 0.1 K/uL   Immature Granulocytes 1 %   Abs  Immature Granulocytes 0.12 (H) 0.00 - 0.07 K/uL    Comment: Performed at [facility] [address], [place], [place] [address]  Basic metabolic panel     Status: Abnormal   Collection Time: 04/22/23  4:56 AM  Result Value Ref Range   Sodium 131 (L) 135 - 145 mmol/L   Potassium 5.2 (H) 3.5 - 5.1 mmol/L   Chloride 104 98 - 111 mmol/L   CO2 20 (L) 22 - 32 mmol/L   Glucose, Bld 145 (H) 70 - 99 mg/dL    Comment: Glucose reference range applies only to samples taken after fasting for at least 8 hours.   BUN 35 (H) 8 - 23 mg/dL   Creatinine, Ser 1.91 0.61 - 1.24 mg/dL   Calcium 8.1 (L) 8.9 - 10.3 mg/dL   GFR, Estimated >47 >82 mL/min    Comment: (NOTE) Calculated using the CKD-EPI Creatinine Equation (2021)    Anion gap 7 5 - 15    Comment: Performed at [facility], [address] [place], [place] [address]  Glucose, capillary     Status: Abnormal   Collection Time: 04/22/23  8:00 AM  Result Value Ref Range   Glucose-Capillary 143 (H) 70 - 99 mg/dL    Comment: Glucose reference range applies only to samples taken after fasting for at least 8 hours.  Glucose, capillary     Status: Abnormal   Collection Time: 04/22/23 11:54 AM  Result Value Ref Range   Glucose-Capillary 213 (H) 70 - 99 mg/dL    Comment: Glucose reference range applies only to samples taken after fasting for at least 8 hours.  Glucose, capillary     Status: Abnormal   Collection Time: 04/22/23  4:06 PM  Result Value Ref Range   Glucose-Capillary 149 (H) 70 - 99 mg/dL    Comment: Glucose reference range applies only to samples taken after fasting for at least 8 hours.  Glucose, capillary     Status: Abnormal   Collection Time: 04/22/23  8:32 PM  Result Value Ref Range   Glucose-Capillary 100 (H) 70 - 99 mg/dL    Comment: Glucose reference range applies only to samples taken after fasting for at least 8 hours.  CBC with Differential/Platelet     Status: Abnormal   Collection Time: 04/23/23  4:17  AM  Result Value Ref Range   WBC 11.0 (H) 4.0 - 10.5 K/uL   RBC 2.96 (L) 4.22 - 5.81 MIL/uL   Hemoglobin 8.5 (L) 13.0 - 17.0 g/dL   HCT 30.8 (L) 65.7 - 84.6 %   MCV 84.8 80.0 - 100.0 fL   MCH 28.7 26.0 - 34.0 pg   MCHC 33.9 30.0 - 36.0 g/dL   RDW 96.2 (H) 95.2 - 84.1 %   Platelets 410 (H) 150 - 400 K/uL   nRBC 0.0 0.0 - 0.2 %   Neutrophils Relative % 63 %  Neutro Abs 6.8 1.7 - 7.7 K/uL   Lymphocytes Relative 24 %   Lymphs Abs 2.6 0.7 - 4.0 K/uL   Monocytes Relative 10 %   Monocytes Absolute 1.1 (H) 0.1 - 1.0 K/uL   Eosinophils Relative 2 %   Eosinophils Absolute 0.2 0.0 - 0.5 K/uL   Basophils Relative 0 %   Basophils Absolute 0.0 0.0 - 0.1 K/uL   Immature Granulocytes 1 %   Abs Immature Granulocytes 0.11 (H) 0.00 - 0.07 K/uL    Comment: Performed at [facility], [address], [place], [place] [address]  Basic metabolic panel     Status: Abnormal   Collection Time: 04/23/23  4:17 AM  Result Value Ref Range   Sodium 134 (L) 135 - 145 mmol/L   Potassium 4.7 3.5 - 5.1 mmol/L   Chloride 104 98 - 111 mmol/L   CO2 18 (L) 22 - 32 mmol/L   Glucose, Bld 100 (H) 70 - 99 mg/dL    Comment: Glucose reference range applies only to samples taken after fasting for at least 8 hours.   BUN 31 (H) 8 - 23 mg/dL   Creatinine, Ser 9.14 0.61 - 1.24 mg/dL   Calcium 8.4 (L) 8.9 - 10.3 mg/dL   GFR, Estimated >78 >29 mL/min    Comment: (NOTE) Calculated using the CKD-EPI Creatinine Equation (2021)    Anion gap 12 5 - 15    Comment: Performed at [facility], [address] [place], [place] [address]  Glucose, capillary     Status: Abnormal   Collection Time: 04/23/23  7:20 AM  Result Value Ref Range   Glucose-Capillary 105 (H) 70 - 99 mg/dL    Comment: Glucose reference range applies only to samples taken after fasting for at least 8 hours.  Glucose, capillary     Status: Abnormal   Collection Time: 04/23/23 11:27 AM  Result Value Ref Range   Glucose-Capillary 183 (H)  70 - 99 mg/dL    Comment: Glucose reference range applies only to samples taken after fasting for at least 8 hours.  Glucose, capillary     Status: Abnormal   Collection Time: 04/23/23  4:41 PM  Result Value Ref Range   Glucose-Capillary 148 (H) 70 - 99 mg/dL    Comment: Glucose reference range applies only to samples taken after fasting for at least 8 hours.  Glucose, capillary     Status: Abnormal   Collection Time: 04/23/23  9:43 PM  Result Value Ref Range   Glucose-Capillary 123 (H) 70 - 99 mg/dL    Comment: Glucose reference range applies only to samples taken after fasting for at least 8 hours.  CBC with Differential/Platelet     Status: Abnormal   Collection Time: 04/24/23  4:17 AM  Result Value Ref Range   WBC 10.8 (H) 4.0 - 10.5 K/uL   RBC 3.00 (L) 4.22 - 5.81 MIL/uL   Hemoglobin 8.3 (L) 13.0 - 17.0 g/dL   HCT 08.6 (L) 57.8 - 46.9 %   MCV 85.7 80.0 - 100.0 fL   MCH 27.7 26.0 - 34.0 pg   MCHC 32.3 30.0 - 36.0 g/dL   RDW 62.9 (H) 52.8 - 41.3 %   Platelets 440 (H) 150 - 400 K/uL   nRBC 0.0 0.0 - 0.2 %   Neutrophils Relative % 62 %   Neutro Abs 6.7 1.7 - 7.7 K/uL   Lymphocytes Relative 26 %   Lymphs Abs 2.8 0.7 - 4.0 K/uL  Monocytes Relative 9 %   Monocytes Absolute 1.0 0.1 - 1.0 K/uL   Eosinophils Relative 2 %   Eosinophils Absolute 0.2 0.0 - 0.5 K/uL   Basophils Relative 0 %   Basophils Absolute 0.0 0.0 - 0.1 K/uL   Immature Granulocytes 1 %   Abs Immature Granulocytes 0.12 (H) 0.00 - 0.07 K/uL    Comment: Performed at [facility], [address] [place], [place] [address]  Basic metabolic panel     Status: Abnormal   Collection Time: 04/24/23  4:17 AM  Result Value Ref Range   Sodium 133 (L) 135 - 145 mmol/L   Potassium 4.3 3.5 - 5.1 mmol/L   Chloride 104 98 - 111 mmol/L   CO2 22 22 - 32 mmol/L   Glucose, Bld 156 (H) 70 - 99 mg/dL    Comment: Glucose reference range applies only to samples taken after fasting for at least 8 hours.   BUN 29 (H) 8 -  23 mg/dL   Creatinine, Ser 9.81 0.61 - 1.24 mg/dL   Calcium 8.2 (L) 8.9 - 10.3 mg/dL   GFR, Estimated >19 >14 mL/min    Comment: (NOTE) Calculated using the CKD-EPI Creatinine Equation (2021)    Anion gap 7 5 - 15    Comment: Performed at [facility], [address] [place], [place] [address]  Hepatic function panel     Status: Abnormal   Collection Time: 04/24/23  4:17 AM  Result Value Ref Range   Total Protein 6.0 (L) 6.5 - 8.1 g/dL   Albumin 2.1 (L) 3.5 - 5.0 g/dL   AST 46 (H) 15 - 41 U/L   ALT 68 (H) 0 - 44 U/L   Alkaline Phosphatase 103 38 - 126 U/L   Total Bilirubin 0.8 0.0 - 1.2 mg/dL   Bilirubin, Direct <6.2 0.0 - 0.2 mg/dL   Indirect Bilirubin NOT CALCULATED 0.3 - 0.9 mg/dL    Comment: Performed at [facility], [address] [place], [place] [address]  Glucose, capillary     Status: Abnormal   Collection Time: 04/24/23  7:54 AM  Result Value Ref Range   Glucose-Capillary 142 (H) 70 - 99 mg/dL    Comment: Glucose reference range applies only to samples taken after fasting for at least 8 hours.  Glucose, capillary     Status: Abnormal   Collection Time: 04/24/23 12:11 PM  Result Value Ref Range   Glucose-Capillary 132 (H) 70 - 99 mg/dL    Comment: Glucose reference range applies only to samples taken after fasting for at least 8 hours.  Glucose, capillary     Status: None   Collection Time: 04/24/23  4:29 PM  Result Value Ref Range   Glucose-Capillary 79 70 - 99 mg/dL    Comment: Glucose reference range applies only to samples taken after fasting for at least 8 hours.  Glucose, capillary     Status: Abnormal   Collection Time: 04/24/23 10:52 PM  Result Value Ref Range   Glucose-Capillary 134 (H) 70 - 99 mg/dL    Comment: Glucose reference range applies only to samples taken after fasting for at least 8 hours.  CBC with Differential/Platelet     Status: Abnormal   Collection Time: 04/25/23  4:29 AM  Result Value Ref Range   WBC 11.9 (H) 4.0 -  10.5 K/uL   RBC 2.98 (L) 4.22 - 5.81 MIL/uL   Hemoglobin 8.3 (L) 13.0 - 17.0 g/dL   HCT 57.8 (L) 46.9 -  52.0 %   MCV 85.9 80.0 - 100.0 fL   MCH 27.9 26.0 - 34.0 pg   MCHC 32.4 30.0 - 36.0 g/dL   RDW 16.1 (H) 09.6 - 04.5 %   Platelets 468 (H) 150 - 400 K/uL   nRBC 0.0 0.0 - 0.2 %   Neutrophils Relative % 70 %   Neutro Abs 8.5 (H) 1.7 - 7.7 K/uL   Lymphocytes Relative 19 %   Lymphs Abs 2.3 0.7 - 4.0 K/uL   Monocytes Relative 7 %   Monocytes Absolute 0.9 0.1 - 1.0 K/uL   Eosinophils Relative 2 %   Eosinophils Absolute 0.2 0.0 - 0.5 K/uL   Basophils Relative 1 %   Basophils Absolute 0.1 0.0 - 0.1 K/uL   Immature Granulocytes 1 %   Abs Immature Granulocytes 0.08 (H) 0.00 - 0.07 K/uL    Comment: Performed at [facility], [address] [place], [place] [address]  Basic metabolic panel     Status: Abnormal   Collection Time: 04/25/23  4:29 AM  Result Value Ref Range   Sodium 134 (L) 135 - 145 mmol/L   Potassium 4.1 3.5 - 5.1 mmol/L   Chloride 105 98 - 111 mmol/L   CO2 18 (L) 22 - 32 mmol/L   Glucose, Bld 106 (H) 70 - 99 mg/dL    Comment: Glucose reference range applies only to samples taken after fasting for at least 8 hours.   BUN 26 (H) 8 - 23 mg/dL   Creatinine, Ser 1.91 0.61 - 1.24 mg/dL   Calcium 8.2 (L) 8.9 - 10.3 mg/dL   GFR, Estimated >47 >82 mL/min    Comment: (NOTE) Calculated using the CKD-EPI Creatinine Equation (2021)    Anion gap 11 5 - 15    Comment: Performed at [facility], [address] [place], [place] [address]  Glucose, capillary     Status: Abnormal   Collection Time: 04/25/23  7:41 AM  Result Value Ref Range   Glucose-Capillary 105 (H) 70 - 99 mg/dL    Comment: Glucose reference range applies only to samples taken after fasting for at least 8 hours.  Glucose, capillary     Status: Abnormal   Collection Time: 04/25/23 11:20 AM  Result Value Ref Range   Glucose-Capillary 155 (H) 70 - 99 mg/dL    Comment: Glucose reference range  applies only to samples taken after fasting for at least 8 hours.  Glucose, capillary     Status: Abnormal   Collection Time: 04/25/23  4:14 PM  Result Value Ref Range   Glucose-Capillary 119 (H) 70 - 99 mg/dL    Comment: Glucose reference range applies only to samples taken after fasting for at least 8 hours.  Glucose, capillary     Status: Abnormal   Collection Time: 04/25/23  9:46 PM  Result Value Ref Range   Glucose-Capillary 156 (H) 70 - 99 mg/dL    Comment: Glucose reference range applies only to samples taken after fasting for at least 8 hours.   Comment 1 Notify RN   Glucose, capillary     Status: Abnormal   Collection Time: 04/26/23  7:51 AM  Result Value Ref Range   Glucose-Capillary 115 (H) 70 - 99 mg/dL    Comment: Glucose reference range applies only to samples taken after fasting for at least 8 hours.  Glucose, capillary     Status: Abnormal   Collection Time: 04/26/23 12:08 PM  Result Value Ref Range   Glucose-Capillary  172 (H) 70 - 99 mg/dL    Comment: Glucose reference range applies only to samples taken after fasting for at least 8 hours.  Glucose, capillary     Status: Abnormal   Collection Time: 04/26/23  4:44 PM  Result Value Ref Range   Glucose-Capillary 117 (H) 70 - 99 mg/dL    Comment: Glucose reference range applies only to samples taken after fasting for at least 8 hours.  Glucose, capillary     Status: None   Collection Time: 04/26/23  9:17 PM  Result Value Ref Range   Glucose-Capillary 74 70 - 99 mg/dL    Comment: Glucose reference range applies only to samples taken after fasting for at least 8 hours.   Comment 1 Notify RN   CBC     Status: Abnormal   Collection Time: 04/27/23  5:11 AM  Result Value Ref Range   WBC 9.6 4.0 - 10.5 K/uL   RBC 2.60 (L) 4.22 - 5.81 MIL/uL   Hemoglobin 7.5 (L) 13.0 - 17.0 g/dL   HCT 16.1 (L) 09.6 - 04.5 %   MCV 86.2 80.0 - 100.0 fL   MCH 28.8 26.0 - 34.0 pg   MCHC 33.5 30.0 - 36.0 g/dL   RDW 40.9 (H) 81.1 - 91.4 %    Platelets 454 (H) 150 - 400 K/uL   nRBC 0.0 0.0 - 0.2 %    Comment: Performed at [facility], [address] [place], [place] [address]  Basic metabolic panel     Status: Abnormal   Collection Time: 04/27/23  5:11 AM  Result Value Ref Range   Sodium 135 135 - 145 mmol/L   Potassium 4.2 3.5 - 5.1 mmol/L   Chloride 109 98 - 111 mmol/L   CO2 22 22 - 32 mmol/L   Glucose, Bld 57 (L) 70 - 99 mg/dL    Comment: Glucose reference range applies only to samples taken after fasting for at least 8 hours.   BUN 23 8 - 23 mg/dL   Creatinine, Ser 6.21 0.61 - 1.24 mg/dL   Calcium 8.1 (L) 8.9 - 10.3 mg/dL   GFR, Estimated >30 >86 mL/min    Comment: (NOTE) Calculated using the CKD-EPI Creatinine Equation (2021)    Anion gap 4 (L) 5 - 15    Comment: Performed at [facility], [address] [place], [place] [address]  Glucose, capillary     Status: Abnormal   Collection Time: 04/27/23  7:57 AM  Result Value Ref Range   Glucose-Capillary 55 (L) 70 - 99 mg/dL    Comment: Glucose reference range applies only to samples taken after fasting for at least 8 hours.  Glucose, capillary     Status: Abnormal   Collection Time: 04/27/23  8:21 AM  Result Value Ref Range   Glucose-Capillary 54 (L) 70 - 99 mg/dL    Comment: Glucose reference range applies only to samples taken after fasting for at least 8 hours.  Glucose, capillary     Status: None   Collection Time: 04/27/23  8:50 AM  Result Value Ref Range   Glucose-Capillary 84 70 - 99 mg/dL    Comment: Glucose reference range applies only to samples taken after fasting for at least 8 hours.  Glucose, capillary     Status: Abnormal   Collection Time: 04/27/23 11:35 AM  Result Value Ref Range   Glucose-Capillary 145 (H) 70 - 99 mg/dL    Comment: Glucose reference range applies only to samples taken  after fasting for at least 8 hours.  Glucose, capillary     Status: Abnormal   Collection Time: 04/27/23  5:19 PM  Result Value Ref Range    Glucose-Capillary 152 (H) 70 - 99 mg/dL    Comment: Glucose reference range applies only to samples taken after fasting for at least 8 hours.  Glucose, capillary     Status: Abnormal   Collection Time: 04/27/23  9:51 PM  Result Value Ref Range   Glucose-Capillary 115 (H) 70 - 99 mg/dL    Comment: Glucose reference range applies only to samples taken after fasting for at least 8 hours.   Comment 1 Notify RN   CBC     Status: Abnormal   Collection Time: 04/28/23  5:44 AM  Result Value Ref Range   WBC 10.7 (H) 4.0 - 10.5 K/uL   RBC 2.73 (L) 4.22 - 5.81 MIL/uL   Hemoglobin 7.5 (L) 13.0 - 17.0 g/dL   HCT 81.1 (L) 91.4 - 78.2 %   MCV 84.6 80.0 - 100.0 fL   MCH 27.5 26.0 - 34.0 pg   MCHC 32.5 30.0 - 36.0 g/dL   RDW 95.6 (H) 21.3 - 08.6 %   Platelets 453 (H) 150 - 400 K/uL   nRBC 0.0 0.0 - 0.2 %    Comment: Performed at [facility], [address], [place], [place] [address]  Basic metabolic panel     Status: Abnormal   Collection Time: 04/28/23  5:44 AM  Result Value Ref Range   Sodium 134 (L) 135 - 145 mmol/L   Potassium 4.9 3.5 - 5.1 mmol/L   Chloride 105 98 - 111 mmol/L   CO2 21 (L) 22 - 32 mmol/L   Glucose, Bld 123 (H) 70 - 99 mg/dL    Comment: Glucose reference range applies only to samples taken after fasting for at least 8 hours.   BUN 28 (H) 8 - 23 mg/dL   Creatinine, Ser 9.62 0.61 - 1.24 mg/dL   Calcium 8.2 (L) 8.9 - 10.3 mg/dL   GFR, Estimated >95 >28 mL/min    Comment: (NOTE) Calculated using the CKD-EPI Creatinine Equation (2021)    Anion gap 8 5 - 15    Comment: Performed at [facility], [address] [place], [place] [address]  Glucose, capillary     Status: Abnormal   Collection Time: 04/28/23  8:32 AM  Result Value Ref Range   Glucose-Capillary 124 (H) 70 - 99 mg/dL    Comment: Glucose reference range applies only to samples taken after fasting for at least 8 hours.  Vancomycin, peak     Status: Abnormal   Collection Time: 04/28/23   8:33 AM  Result Value Ref Range   Vancomycin Pk 20 (L) 30 - 40 ug/mL    Comment: Performed at [facility], [address] [place], [place] [address]  Sedimentation rate     Status: Abnormal   Collection Time: 04/28/23 10:19 AM  Result Value Ref Range   Sed Rate 94 (H) 0 - 20 mm/hr    Comment: Performed at [facility], [address] [place], [place] [address]  C-reactive protein     Status: Abnormal   Collection Time: 04/28/23 10:19 AM  Result Value Ref Range   CRP 7.8 (H) <1.0 mg/dL    Comment: Performed at [facility], [address] [place], [place] [address]  Uric acid     Status: None   Collection Time: 04/28/23  10:19 AM  Result Value Ref Range   Uric Acid, Serum 6.4 3.7 - 8.6 mg/dL    Comment: Performed at [facility], [address] [place], [place] [address]  Glucose, capillary     Status: Abnormal   Collection Time: 04/28/23 11:40 AM  Result Value Ref Range   Glucose-Capillary 129 (H) 70 - 99 mg/dL    Comment: Glucose reference range applies only to samples taken after fasting for at least 8 hours.  Glucose, capillary     Status: Abnormal   Collection Time: 04/28/23  1:18 PM  Result Value Ref Range   Glucose-Capillary 114 (H) 70 - 99 mg/dL    Comment: Glucose reference range applies only to samples taken after fasting for at least 8 hours.  Glucose, capillary     Status: Abnormal   Collection Time: 04/28/23  4:09 PM  Result Value Ref Range   Glucose-Capillary 126 (H) 70 - 99 mg/dL    Comment: Glucose reference range applies only to samples taken after fasting for at least 8 hours.  Glucose, capillary     Status: Abnormal   Collection Time: 04/28/23 10:32 PM  Result Value Ref Range   Glucose-Capillary 17 (LL) 70 - 99 mg/dL    Comment: Glucose reference range applies only to samples taken after fasting for at least 8 hours.   Comment 1 Notify RN   Glucose, capillary     Status: Abnormal   Collection Time: 04/28/23 10:35 PM   Result Value Ref Range   Glucose-Capillary 32 (LL) 70 - 99 mg/dL    Comment: Glucose reference range applies only to samples taken after fasting for at least 8 hours.   Comment 1 Notify RN   Glucose, capillary     Status: Abnormal   Collection Time: 04/28/23 10:53 PM  Result Value Ref Range   Glucose-Capillary 33 (LL) 70 - 99 mg/dL    Comment: Glucose reference range applies only to samples taken after fasting for at least 8 hours.   Comment 1 Notify RN   Glucose, capillary     Status: Abnormal   Collection Time: 04/28/23 10:59 PM  Result Value Ref Range   Glucose-Capillary 45 (L) 70 - 99 mg/dL    Comment: Glucose reference range applies only to samples taken after fasting for at least 8 hours.  Glucose, capillary     Status: Abnormal   Collection Time: 04/28/23 11:20 PM  Result Value Ref Range   Glucose-Capillary 148 (H) 70 - 99 mg/dL    Comment: Glucose reference range applies only to samples taken after fasting for at least 8 hours.  Glucose, capillary     Status: Abnormal   Collection Time: 04/29/23  1:27 AM  Result Value Ref Range   Glucose-Capillary 139 (H) 70 - 99 mg/dL    Comment: Glucose reference range applies only to samples taken after fasting for at least 8 hours.  Vancomycin, trough     Status: Abnormal   Collection Time: 04/29/23  5:21 AM  Result Value Ref Range   Vancomycin Tr 13 (L) 15 - 20 ug/mL    Comment: Performed at [facility], [address] [place], [place] [address]  CBC with Differential/Platelet     Status: Abnormal   Collection Time: 04/29/23  5:21 AM  Result Value Ref Range   WBC 7.9 4.0 - 10.5 K/uL   RBC 2.55 (L) 4.22 - 5.81 MIL/uL   Hemoglobin 7.2 (L) 13.0 - 17.0 g/dL   HCT 96.2 (  L) 39.0 - 52.0 %   MCV 85.9 80.0 - 100.0 fL   MCH 28.2 26.0 - 34.0 pg   MCHC 32.9 30.0 - 36.0 g/dL   RDW 40.9 (H) 81.1 - 91.4 %   Platelets 456 (H) 150 - 400 K/uL   nRBC 0.0 0.0 - 0.2 %   Neutrophils Relative % 64 %   Neutro Abs 5.0 1.7 - 7.7 K/uL    Lymphocytes Relative 23 %   Lymphs Abs 1.8 0.7 - 4.0 K/uL   Monocytes Relative 10 %   Monocytes Absolute 0.8 0.1 - 1.0 K/uL   Eosinophils Relative 2 %   Eosinophils Absolute 0.1 0.0 - 0.5 K/uL   Basophils Relative 1 %   Basophils Absolute 0.1 0.0 - 0.1 K/uL   Immature Granulocytes 0 %   Abs Immature Granulocytes 0.03 0.00 - 0.07 K/uL    Comment: Performed at [facility], [address], [place], [place] [address]  Basic metabolic panel     Status: Abnormal   Collection Time: 04/29/23  5:21 AM  Result Value Ref Range   Sodium 131 (L) 135 - 145 mmol/L   Potassium 4.7 3.5 - 5.1 mmol/L   Chloride 102 98 - 111 mmol/L   CO2 19 (L) 22 - 32 mmol/L   Glucose, Bld 131 (H) 70 - 99 mg/dL    Comment: Glucose reference range applies only to samples taken after fasting for at least 8 hours.   BUN 28 (H) 8 - 23 mg/dL   Creatinine, Ser 6.21 0.61 - 1.24 mg/dL   Calcium 8.4 (L) 8.9 - 10.3 mg/dL   GFR, Estimated >30 >86 mL/min    Comment: (NOTE) Calculated using the CKD-EPI Creatinine Equation (2021)    Anion gap 10 5 - 15    Comment: Performed at [facility], [address] [place], [place] [address]  Glucose, capillary     Status: Abnormal   Collection Time: 04/29/23  7:10 AM  Result Value Ref Range   Glucose-Capillary 114 (H) 70 - 99 mg/dL    Comment: Glucose reference range applies only to samples taken after fasting for at least 8 hours.  Glucose, capillary     Status: Abnormal   Collection Time: 04/29/23 11:07 AM  Result Value Ref Range   Glucose-Capillary 194 (H) 70 - 99 mg/dL    Comment: Glucose reference range applies only to samples taken after fasting for at least 8 hours.  Glucose, capillary     Status: Abnormal   Collection Time: 04/29/23  1:14 PM  Result Value Ref Range   Glucose-Capillary 214 (H) 70 - 99 mg/dL    Comment: Glucose reference range applies only to samples taken after fasting for at least 8 hours.  Type and screen [facility]     Status: None   Collection Time: 04/29/23  1:25 PM  Result Value Ref Range   ABO/RH(D) A POS    Antibody Screen NEG    Sample Expiration 05/02/2023,2359    Unit Number [id number]    Blood Component Type RBC LR PHER2    Unit division 00    Status of Unit REL FROM [facility]    Transfusion Status OK TO TRANSFUSE    Crossmatch Result      Compatible Performed at [facility], [address] [place], [place] [address]   BPAM RBC     Status: None   Collection Time: 04/29/23  1:25 PM  Result Value Ref Range  Blood Product Unit Number [id number]    PRODUCT CODE [id number]    Unit Type and Rh 0600    Blood Product Expiration Date [id number]   Prepare RBC (crossmatch)     Status: None   Collection Time: 04/29/23  2:00 PM  Result Value Ref Range   Order Confirmation      ORDER PROCESSED BY BLOOD BANK Performed at [facility], [address] [place], [place] [address]   Glucose, capillary     Status: Abnormal   Collection Time: 04/29/23  9:24 PM  Result Value Ref Range   Glucose-Capillary 246 (H) 70 - 99 mg/dL    Comment: Glucose reference range applies only to samples taken after fasting for at least 8 hours.   Comment 1 Notify RN   CBC with Differential/Platelet     Status: Abnormal   Collection Time: 04/30/23  5:31 AM  Result Value Ref Range   WBC 7.7 4.0 - 10.5 K/uL   RBC 2.59 (L) 4.22 - 5.81 MIL/uL   Hemoglobin 7.2 (L) 13.0 - 17.0 g/dL   HCT 34.7 (L) 42.5 - 95.6 %   MCV 88.0 80.0 - 100.0 fL   MCH 27.8 26.0 - 34.0 pg   MCHC 31.6 30.0 - 36.0 g/dL   RDW 38.7 (H) 56.4 - 33.2 %   Platelets 486 (H) 150 - 400 K/uL   nRBC 0.0 0.0 - 0.2 %   Neutrophils Relative % 67 %   Neutro Abs 5.3 1.7 - 7.7 K/uL   Lymphocytes Relative 19 %   Lymphs Abs 1.5 0.7 - 4.0 K/uL   Monocytes Relative 10 %   Monocytes Absolute 0.8 0.1 - 1.0 K/uL   Eosinophils Relative 2 %   Eosinophils Absolute 0.1 0.0 - 0.5 K/uL   Basophils Relative 1 %   Basophils Absolute 0.0  0.0 - 0.1 K/uL   Immature Granulocytes 1 %   Abs Immature Granulocytes 0.04 0.00 - 0.07 K/uL    Comment: Performed at [facility], [address] [place], [place] [address]  Basic metabolic panel     Status: Abnormal   Collection Time: 04/30/23  5:31 AM  Result Value Ref Range   Sodium 134 (L) 135 - 145 mmol/L   Potassium 5.0 3.5 - 5.1 mmol/L   Chloride 103 98 - 111 mmol/L   CO2 22 22 - 32 mmol/L   Glucose, Bld 181 (H) 70 - 99 mg/dL    Comment: Glucose reference range applies only to samples taken after fasting for at least 8 hours.   BUN 25 (H) 8 - 23 mg/dL   Creatinine, Ser 4.16 0.61 - 1.24 mg/dL   Calcium 8.6 (L) 8.9 - 10.3 mg/dL   GFR, Estimated >60 >63 mL/min    Comment: (NOTE) Calculated using the CKD-EPI Creatinine Equation (2021)    Anion gap 9 5 - 15    Comment: Performed at [facility], [address] [place], [place] [address]  Hepatic function panel     Status: Abnormal   Collection Time: 04/30/23  5:31 AM  Result Value Ref Range   Total Protein 6.3 (L) 6.5 - 8.1 g/dL   Albumin 2.2 (L) 3.5 - 5.0 g/dL   AST 32 15 - 41 U/L   ALT 54 (H) 0 - 44 U/L   Alkaline Phosphatase 93 38 - 126 U/L   Total Bilirubin 0.3 0.0 - 1.2 mg/dL   Bilirubin, Direct <0.9 0.0 - 0.2 mg/dL   Indirect Bilirubin NOT CALCULATED  0.3 - 0.9 mg/dL    Comment: Performed at [facility], [address] [place], [place] [address]  Glucose, capillary     Status: Abnormal   Collection Time: 04/30/23  8:24 AM  Result Value Ref Range   Glucose-Capillary 138 (H) 70 - 99 mg/dL    Comment: Glucose reference range applies only to samples taken after fasting for at least 8 hours.  Glucose, capillary     Status: Abnormal   Collection Time: 04/30/23 11:59 AM  Result Value Ref Range   Glucose-Capillary 215 (H) 70 - 99 mg/dL    Comment: Glucose reference range applies only to samples taken after fasting for at least 8 hours.  Glucose, capillary     Status: Abnormal   Collection  Time: 04/30/23  5:08 PM  Result Value Ref Range   Glucose-Capillary 161 (H) 70 - 99 mg/dL    Comment: Glucose reference range applies only to samples taken after fasting for at least 8 hours.  Glucose, capillary     Status: Abnormal   Collection Time: 04/30/23  9:23 PM  Result Value Ref Range   Glucose-Capillary 194 (H) 70 - 99 mg/dL    Comment: Glucose reference range applies only to samples taken after fasting for at least 8 hours.   Comment 1 Notify RN   Glucose, capillary     Status: Abnormal   Collection Time: 05/01/23  7:18 AM  Result Value Ref Range   Glucose-Capillary 164 (H) 70 - 99 mg/dL    Comment: Glucose reference range applies only to samples taken after fasting for at least 8 hours.  Glucose, capillary     Status: Abnormal   Collection Time: 05/01/23 11:04 AM  Result Value Ref Range   Glucose-Capillary 194 (H) 70 - 99 mg/dL    Comment: Glucose reference range applies only to samples taken after fasting for at least 8 hours.  Glucose, capillary     Status: Abnormal   Collection Time: 05/01/23  4:50 PM  Result Value Ref Range   Glucose-Capillary 170 (H) 70 - 99 mg/dL    Comment: Glucose reference range applies only to samples taken after fasting for at least 8 hours.  Glucose, capillary     Status: Abnormal   Collection Time: 05/01/23  8:54 PM  Result Value Ref Range   Glucose-Capillary 178 (H) 70 - 99 mg/dL    Comment: Glucose reference range applies only to samples taken after fasting for at least 8 hours.  Vancomycin, trough     Status: Abnormal   Collection Time: 05/02/23  5:24 AM  Result Value Ref Range   Vancomycin Tr 21 (HH) 15 - 20 ug/mL    Comment: CRITICAL RESULT CALLED TO, READ BACK BY AND VERIFIED WITH NATHAN BELUE AT 8295 05/02/23.PMF Performed at [facility], [address] [place], [place] [address]   CBC     Status: Abnormal   Collection Time: 05/02/23  5:24 AM  Result Value Ref Range   WBC 7.5 4.0 - 10.5 K/uL   RBC 2.59 (L) 4.22 -  5.81 MIL/uL   Hemoglobin 7.0 (L) 13.0 - 17.0 g/dL   HCT 86.5 (L) 78.4 - 69.6 %   MCV 85.3 80.0 - 100.0 fL   MCH 27.0 26.0 - 34.0 pg   MCHC 31.7 30.0 - 36.0 g/dL   RDW 29.5 (H) 28.4 - 13.2 %   Platelets 508 (H) 150 - 400 K/uL   nRBC 0.0 0.0 - 0.2 %  Comment: Performed at [facility], [address] [place], [place] [address]  Basic metabolic panel     Status: Abnormal   Collection Time: 05/02/23  5:24 AM  Result Value Ref Range   Sodium 133 (L) 135 - 145 mmol/L   Potassium 5.1 3.5 - 5.1 mmol/L   Chloride 104 98 - 111 mmol/L   CO2 22 22 - 32 mmol/L   Glucose, Bld 154 (H) 70 - 99 mg/dL    Comment: Glucose reference range applies only to samples taken after fasting for at least 8 hours.   BUN 18 8 - 23 mg/dL   Creatinine, Ser 6.04 0.61 - 1.24 mg/dL   Calcium 8.5 (L) 8.9 - 10.3 mg/dL   GFR, Estimated >54 >09 mL/min    Comment: (NOTE) Calculated using the CKD-EPI Creatinine Equation (2021)    Anion gap 7 5 - 15    Comment: Performed at [facility], [address] [place], [place] [address]  Glucose, capillary     Status: Abnormal   Collection Time: 05/02/23  8:24 AM  Result Value Ref Range   Glucose-Capillary 142 (H) 70 - 99 mg/dL    Comment: Glucose reference range applies only to samples taken after fasting for at least 8 hours.  Glucose, capillary     Status: Abnormal   Collection Time: 05/02/23 11:35 AM  Result Value Ref Range   Glucose-Capillary 136 (H) 70 - 99 mg/dL    Comment: Glucose reference range applies only to samples taken after fasting for at least 8 hours.  Glucose, capillary     Status: Abnormal   Collection Time: 05/02/23  4:21 PM  Result Value Ref Range   Glucose-Capillary 171 (H) 70 - 99 mg/dL    Comment: Glucose reference range applies only to samples taken after fasting for at least 8 hours.  Glucose, capillary     Status: Abnormal   Collection Time: 05/02/23  9:21 PM  Result Value Ref Range   Glucose-Capillary 196 (H) 70 - 99  mg/dL    Comment: Glucose reference range applies only to samples taken after fasting for at least 8 hours.  CBC     Status: Abnormal   Collection Time: 05/03/23  1:06 AM  Result Value Ref Range   WBC 7.5 4.0 - 10.5 K/uL   RBC 2.63 (L) 4.22 - 5.81 MIL/uL   Hemoglobin 7.2 (L) 13.0 - 17.0 g/dL   HCT 47.8 (L) 29.5 - 62.1 %   MCV 85.2 80.0 - 100.0 fL   MCH 27.4 26.0 - 34.0 pg   MCHC 32.1 30.0 - 36.0 g/dL   RDW 30.8 (H) 65.7 - 84.6 %   Platelets 519 (H) 150 - 400 K/uL   nRBC 0.0 0.0 - 0.2 %    Comment: Performed at [facility], [address], [place], [place] [address]  Basic metabolic panel     Status: Abnormal   Collection Time: 05/03/23  1:06 AM  Result Value Ref Range   Sodium 132 (L) 135 - 145 mmol/L   Potassium 5.0 3.5 - 5.1 mmol/L   Chloride 103 98 - 111 mmol/L   CO2 23 22 - 32 mmol/L   Glucose, Bld 183 (H) 70 - 99 mg/dL    Comment: Glucose reference range applies only to samples taken after fasting for at least 8 hours.   BUN 18 8 - 23 mg/dL   Creatinine, Ser 2.84 0.61 - 1.24 mg/dL   Calcium 8.4 (L) 8.9 - 10.3  mg/dL   GFR, Estimated >21 >30 mL/min    Comment: (NOTE) Calculated using the CKD-EPI Creatinine Equation (2021)    Anion gap 6 5 - 15    Comment: Performed at [facility], [address] [place], [place] [address]  Glucose, capillary     Status: Abnormal   Collection Time: 05/03/23  8:02 AM  Result Value Ref Range   Glucose-Capillary 140 (H) 70 - 99 mg/dL    Comment: Glucose reference range applies only to samples taken after fasting for at least 8 hours.  Glucose, capillary     Status: Abnormal   Collection Time: 05/03/23 11:37 AM  Result Value Ref Range   Glucose-Capillary 168 (H) 70 - 99 mg/dL    Comment: Glucose reference range applies only to samples taken after fasting for at least 8 hours.  Glucose, capillary     Status: Abnormal   Collection Time: 05/03/23  5:51 PM  Result Value Ref Range   Glucose-Capillary 134 (H) 70 - 99  mg/dL    Comment: Glucose reference range applies only to samples taken after fasting for at least 8 hours.  Glucose, capillary     Status: Abnormal   Collection Time: 05/03/23  8:02 PM  Result Value Ref Range   Glucose-Capillary 181 (H) 70 - 99 mg/dL    Comment: Glucose reference range applies only to samples taken after fasting for at least 8 hours.   Comment 1 Notify RN   CBC     Status: Abnormal   Collection Time: 05/04/23  4:32 AM  Result Value Ref Range   WBC 6.3 4.0 - 10.5 K/uL   RBC 2.74 (L) 4.22 - 5.81 MIL/uL   Hemoglobin 7.6 (L) 13.0 - 17.0 g/dL   HCT 46.9 (L) 62.9 - 52.8 %   MCV 83.9 80.0 - 100.0 fL   MCH 27.7 26.0 - 34.0 pg   MCHC 33.0 30.0 - 36.0 g/dL   RDW 41.3 (H) 24.4 - 01.0 %   Platelets 489 (H) 150 - 400 K/uL   nRBC 0.0 0.0 - 0.2 %    Comment: Performed at [facility], [address] [place], [place] [address]  Basic metabolic panel     Status: Abnormal   Collection Time: 05/04/23  4:32 AM  Result Value Ref Range   Sodium 132 (L) 135 - 145 mmol/L   Potassium 4.6 3.5 - 5.1 mmol/L   Chloride 102 98 - 111 mmol/L   CO2 22 22 - 32 mmol/L   Glucose, Bld 136 (H) 70 - 99 mg/dL    Comment: Glucose reference range applies only to samples taken after fasting for at least 8 hours.   BUN 19 8 - 23 mg/dL   Creatinine, Ser 6.64 0.61 - 1.24 mg/dL   Calcium 8.6 (L) 8.9 - 10.3 mg/dL   GFR, Estimated >40 >34 mL/min    Comment: (NOTE) Calculated using the CKD-EPI Creatinine Equation (2021)    Anion gap 8 5 - 15    Comment: Performed at [facility], [address] [place], [place] [address]  Glucose, capillary     Status: Abnormal   Collection Time: 05/04/23  8:31 AM  Result Value Ref Range   Glucose-Capillary 137 (H) 70 - 99 mg/dL    Comment: Glucose reference range applies only to samples taken after fasting for at least 8 hours.  Glucose, capillary     Status: Abnormal   Collection Time: 05/04/23 12:13 PM  Result Value Ref Range  Glucose-Capillary 207 (H) 70 - 99 mg/dL    Comment: Glucose reference range applies only to samples taken after fasting for at least 8 hours.  Glucose, capillary     Status: Abnormal   Collection Time: 05/04/23  3:30 PM  Result Value Ref Range   Glucose-Capillary 153 (H) 70 - 99 mg/dL    Comment: Glucose reference range applies only to samples taken after fasting for at least 8 hours.  Glucose, capillary     Status: Abnormal   Collection Time: 05/04/23  5:32 PM  Result Value Ref Range   Glucose-Capillary 139 (H) 70 - 99 mg/dL    Comment: Glucose reference range applies only to samples taken after fasting for at least 8 hours.  Glucose, capillary     Status: Abnormal   Collection Time: 05/04/23  9:29 PM  Result Value Ref Range   Glucose-Capillary 137 (H) 70 - 99 mg/dL    Comment: Glucose reference range applies only to samples taken after fasting for at least 8 hours.  CBC     Status: Abnormal   Collection Time: 05/05/23  4:37 AM  Result Value Ref Range   WBC 5.7 4.0 - 10.5 K/uL   RBC 2.81 (L) 4.22 - 5.81 MIL/uL   Hemoglobin 7.7 (L) 13.0 - 17.0 g/dL   HCT 40.9 (L) 81.1 - 91.4 %   MCV 82.9 80.0 - 100.0 fL   MCH 27.4 26.0 - 34.0 pg   MCHC 33.0 30.0 - 36.0 g/dL   RDW 78.2 95.6 - 21.3 %   Platelets 478 (H) 150 - 400 K/uL   nRBC 0.0 0.0 - 0.2 %    Comment: Performed at [facility], [address], [place], [place] [address]  Basic metabolic panel     Status: Abnormal   Collection Time: 05/05/23  4:37 AM  Result Value Ref Range   Sodium 132 (L) 135 - 145 mmol/L   Potassium 4.6 3.5 - 5.1 mmol/L   Chloride 101 98 - 111 mmol/L   CO2 23 22 - 32 mmol/L   Glucose, Bld 134 (H) 70 - 99 mg/dL    Comment: Glucose reference range applies only to samples taken after fasting for at least 8 hours.   BUN 21 8 - 23 mg/dL   Creatinine, Ser 8.46 0.61 - 1.24 mg/dL   Calcium 8.5 (L) 8.9 - 10.3 mg/dL   GFR, Estimated >96 >29 mL/min    Comment: (NOTE) Calculated using the CKD-EPI  Creatinine Equation (2021)    Anion gap 8 5 - 15    Comment: Performed at [facility], [address] [place], [place] [address]  Glucose, capillary     Status: Abnormal   Collection Time: 05/05/23  8:16 AM  Result Value Ref Range   Glucose-Capillary 121 (H) 70 - 99 mg/dL    Comment: Glucose reference range applies only to samples taken after fasting for at least 8 hours.  Glucose, capillary     Status: Abnormal   Collection Time: 05/05/23 11:51 AM  Result Value Ref Range   Glucose-Capillary 163 (H) 70 - 99 mg/dL    Comment: Glucose reference range applies only to samples taken after fasting for at least 8 hours.  Glucose, capillary     Status: Abnormal   Collection Time: 05/05/23  5:06 PM  Result Value Ref Range   Glucose-Capillary 201 (H) 70 - 99 mg/dL    Comment: Glucose reference range applies only to samples taken after fasting for at least  8 hours.  Glucose, capillary     Status: Abnormal   Collection Time: 05/05/23  9:04 PM  Result Value Ref Range   Glucose-Capillary 146 (H) 70 - 99 mg/dL    Comment: Glucose reference range applies only to samples taken after fasting for at least 8 hours.   Comment 1 Notify RN   CBC     Status: Abnormal   Collection Time: 05/06/23  6:02 AM  Result Value Ref Range   WBC 5.5 4.0 - 10.5 K/uL   RBC 2.96 (L) 4.22 - 5.81 MIL/uL   Hemoglobin 8.0 (L) 13.0 - 17.0 g/dL   HCT 16.1 (L) 09.6 - 04.5 %   MCV 84.1 80.0 - 100.0 fL   MCH 27.0 26.0 - 34.0 pg   MCHC 32.1 30.0 - 36.0 g/dL   RDW 40.9 81.1 - 91.4 %   Platelets 456 (H) 150 - 400 K/uL   nRBC 0.0 0.0 - 0.2 %    Comment: Performed at [facility], [address], [place], [place] [address]  Basic metabolic panel     Status: Abnormal   Collection Time: 05/06/23  6:02 AM  Result Value Ref Range   Sodium 135 135 - 145 mmol/L   Potassium 4.5 3.5 - 5.1 mmol/L   Chloride 102 98 - 111 mmol/L   CO2 23 22 - 32 mmol/L   Glucose, Bld 162 (H) 70 - 99 mg/dL    Comment: Glucose  reference range applies only to samples taken after fasting for at least 8 hours.   BUN 16 8 - 23 mg/dL   Creatinine, Ser 6.21 0.61 - 1.24 mg/dL   Calcium 8.8 (L) 8.9 - 10.3 mg/dL   GFR, Estimated >30 >86 mL/min    Comment: (NOTE) Calculated using the CKD-EPI Creatinine Equation (2021)    Anion gap 10 5 - 15    Comment: Performed at [facility], [address] [place], [place] [address]  Glucose, capillary     Status: Abnormal   Collection Time: 05/06/23  7:50 AM  Result Value Ref Range   Glucose-Capillary 175 (H) 70 - 99 mg/dL    Comment: Glucose reference range applies only to samples taken after fasting for at least 8 hours.  Glucose, capillary     Status: Abnormal   Collection Time: 05/06/23 11:40 AM  Result Value Ref Range   Glucose-Capillary 200 (H) 70 - 99 mg/dL    Comment: Glucose reference range applies only to samples taken after fasting for at least 8 hours.  Glucose, capillary     Status: Abnormal   Collection Time: 05/06/23  4:57 PM  Result Value Ref Range   Glucose-Capillary 136 (H) 70 - 99 mg/dL    Comment: Glucose reference range applies only to samples taken after fasting for at least 8 hours.  Hepatic function panel     Status: Abnormal   Collection Time: 05/06/23  6:47 PM  Result Value Ref Range   Total Protein 6.3 (L) 6.5 - 8.1 g/dL   Albumin 2.1 (L) 3.5 - 5.0 g/dL   AST 14 (L) 15 - 41 U/L   ALT 22 0 - 44 U/L   Alkaline Phosphatase 85 38 - 126 U/L   Total Bilirubin 0.5 0.0 - 1.2 mg/dL   Bilirubin, Direct <9.6 0.0 - 0.2 mg/dL   Indirect Bilirubin NOT CALCULATED 0.3 - 0.9 mg/dL    Comment: Performed at [facility], [address] [place], [place] [address]  Glucose, capillary  Status: Abnormal   Collection Time: 05/06/23  9:13 PM  Result Value Ref Range   Glucose-Capillary 135 (H) 70 - 99 mg/dL    Comment: Glucose reference range applies only to samples taken after fasting for at least 8 hours.   Comment 1 Notify RN   CBC      Status: Abnormal   Collection Time: 05/07/23  3:26 AM  Result Value Ref Range   WBC 5.6 4.0 - 10.5 K/uL   RBC 3.14 (L) 4.22 - 5.81 MIL/uL   Hemoglobin 8.3 (L) 13.0 - 17.0 g/dL   HCT 19.1 (L) 47.8 - 29.5 %   MCV 82.8 80.0 - 100.0 fL   MCH 26.4 26.0 - 34.0 pg   MCHC 31.9 30.0 - 36.0 g/dL   RDW 62.1 30.8 - 65.7 %   Platelets 478 (H) 150 - 400 K/uL   nRBC 0.0 0.0 - 0.2 %    Comment: Performed at [facility], [address], [place], [place] [address]  Basic metabolic panel     Status: Abnormal   Collection Time: 05/07/23  3:26 AM  Result Value Ref Range   Sodium 133 (L) 135 - 145 mmol/L   Potassium 5.2 (H) 3.5 - 5.1 mmol/L   Chloride 101 98 - 111 mmol/L   CO2 24 22 - 32 mmol/L   Glucose, Bld 107 (H) 70 - 99 mg/dL    Comment: Glucose reference range applies only to samples taken after fasting for at least 8 hours.   BUN 16 8 - 23 mg/dL   Creatinine, Ser 2.95 0.61 - 1.24 mg/dL   Calcium 8.8 (L) 8.9 - 10.3 mg/dL   GFR, Estimated >28 >41 mL/min    Comment: (NOTE) Calculated using the CKD-EPI Creatinine Equation (2021)    Anion gap 8 5 - 15    Comment: Performed at [facility], [address] [place], [place] [address]  Sedimentation rate     Status: Abnormal   Collection Time: 05/07/23  3:26 AM  Result Value Ref Range   Sed Rate 65 (H) 0 - 20 mm/hr    Comment: Performed at [facility], [address] [place], [place] [address]  C-reactive protein     Status: Abnormal   Collection Time: 05/07/23  3:26 AM  Result Value Ref Range   CRP 4.8 (H) <1.0 mg/dL    Comment: Performed at [facility], [address], [place], [place] [address]  Glucose, capillary     Status: Abnormal   Collection Time: 05/07/23  7:42 AM  Result Value Ref Range   Glucose-Capillary 133 (H) 70 - 99 mg/dL    Comment: Glucose reference range applies only to samples taken after fasting for at least 8 hours.  Glucose, capillary     Status: Abnormal   Collection Time:  05/07/23 12:44 PM  Result Value Ref Range   Glucose-Capillary 121 (H) 70 - 99 mg/dL    Comment: Glucose reference range applies only to samples taken after fasting for at least 8 hours.  Glucose, capillary     Status: Abnormal   Collection Time: 05/07/23  4:52 PM  Result Value Ref Range   Glucose-Capillary 114 (H) 70 - 99 mg/dL    Comment: Glucose reference range applies only to samples taken after fasting for at least 8 hours.  Glucose, capillary     Status: Abnormal   Collection Time: 05/07/23  9:12 PM  Result Value Ref Range   Glucose-Capillary 135 (H) 70 -  99 mg/dL    Comment: Glucose reference range applies only to samples taken after fasting for at least 8 hours.  Basic metabolic panel     Status: Abnormal   Collection Time: 05/08/23  5:17 AM  Result Value Ref Range   Sodium 131 (L) 135 - 145 mmol/L   Potassium 4.4 3.5 - 5.1 mmol/L   Chloride 102 98 - 111 mmol/L   CO2 23 22 - 32 mmol/L   Glucose, Bld 104 (H) 70 - 99 mg/dL    Comment: Glucose reference range applies only to samples taken after fasting for at least 8 hours.   BUN 17 8 - 23 mg/dL   Creatinine, Ser 4.09 0.61 - 1.24 mg/dL   Calcium 8.6 (L) 8.9 - 10.3 mg/dL   GFR, Estimated >81 >19 mL/min    Comment: (NOTE) Calculated using the CKD-EPI Creatinine Equation (2021)    Anion gap 6 5 - 15    Comment: Performed at [facility], [address] [place], [place] [address]  Glucose, capillary     Status: None   Collection Time: 05/08/23  8:37 AM  Result Value Ref Range   Glucose-Capillary 99 70 - 99 mg/dL    Comment: Glucose reference range applies only to samples taken after fasting for at least 8 hours.  Glucose, capillary     Status: Abnormal   Collection Time: 05/08/23 11:13 AM  Result Value Ref Range   Glucose-Capillary 186 (H) 70 - 99 mg/dL    Comment: Glucose reference range applies only to samples taken after fasting for at least 8 hours.    Radiology No results found.  Assessment/Plan  PAD  (peripheral artery disease) (HCC) S/p right BKA.  Check left ABI in 2-3 months.  Essential hypertension blood pressure control important in reducing the progression of atherosclerotic disease. On appropriate oral medications.   Diabetes mellitus type 2, insulin dependent (HCC) blood glucose control important in reducing the progression of atherosclerotic disease. Also, involved in wound healing. On appropriate medications.   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  The 2 superficial openings are not concerning and certainly do not appear infected.  I would continue his prosthetic evaluation.  Follow-up in 2 to 3 months.    Festus Barren, MD  06/20/2023 12:35 PM    This note was created with Dragon medical transcription system.  Any errors from dictation are purely unintentional

## 2023-06-20 NOTE — Assessment & Plan Note (Signed)
 This is healing well.  The 2 superficial openings are not concerning and certainly do not appear infected.  I would continue his prosthetic evaluation.  Follow-up in 2 to 3 months.

## 2023-06-20 NOTE — Assessment & Plan Note (Signed)
 blood glucose control important in reducing the progression of atherosclerotic disease. Also, involved in wound healing. On appropriate medications.

## 2023-06-20 NOTE — Assessment & Plan Note (Signed)
 S/p right BKA.  Check left ABI in 2-3 months.

## 2023-06-20 NOTE — Assessment & Plan Note (Signed)
 blood pressure control important in reducing the progression of atherosclerotic disease. On appropriate oral medications.

## 2023-06-30 NOTE — Telephone Encounter (Signed)
 Copied from CRM [id number]. Topic: General - Other >> Jun 30, 2023  2:55 PM Emylou G wrote: Reason for CRM: patient needing referral counseling.. wife called for spouse.. please contact patient [phone] or wife [phone]

## 2023-07-09 ENCOUNTER — Emergency Department

## 2023-07-09 ENCOUNTER — Inpatient Hospital Stay
Admission: EM | Admit: 2023-07-09 | Discharge: 2023-07-16 | DRG: 321 | Disposition: A | Discharge status: Home / Self Care | Attending: Internal Medicine | Admitting: Internal Medicine

## 2023-07-09 ENCOUNTER — Other Ambulatory Visit: Payer: Self-pay

## 2023-07-09 DIAGNOSIS — Z23 Encounter for immunization: Secondary | ICD-10-CM | POA: Diagnosis present

## 2023-07-09 DIAGNOSIS — Z79899 Other long term (current) drug therapy: Secondary | ICD-10-CM

## 2023-07-09 DIAGNOSIS — Z951 Presence of aortocoronary bypass graft: Secondary | ICD-10-CM

## 2023-07-09 DIAGNOSIS — E11649 Type 2 diabetes mellitus with hypoglycemia without coma: Secondary | ICD-10-CM | POA: Diagnosis present

## 2023-07-09 DIAGNOSIS — Z7189 Other specified counseling: Secondary | ICD-10-CM | POA: Diagnosis not present

## 2023-07-09 DIAGNOSIS — E875 Hyperkalemia: Secondary | ICD-10-CM | POA: Diagnosis present

## 2023-07-09 DIAGNOSIS — I255 Ischemic cardiomyopathy: Secondary | ICD-10-CM | POA: Diagnosis present

## 2023-07-09 DIAGNOSIS — I252 Old myocardial infarction: Secondary | ICD-10-CM

## 2023-07-09 DIAGNOSIS — E1140 Type 2 diabetes mellitus with diabetic neuropathy, unspecified: Secondary | ICD-10-CM | POA: Diagnosis present

## 2023-07-09 DIAGNOSIS — I083 Combined rheumatic disorders of mitral, aortic and tricuspid valves: Secondary | ICD-10-CM | POA: Diagnosis present

## 2023-07-09 DIAGNOSIS — F4321 Adjustment disorder with depressed mood: Secondary | ICD-10-CM | POA: Diagnosis not present

## 2023-07-09 DIAGNOSIS — I214 Non-ST elevation (NSTEMI) myocardial infarction: Principal | ICD-10-CM | POA: Diagnosis present

## 2023-07-09 DIAGNOSIS — E119 Type 2 diabetes mellitus without complications: Secondary | ICD-10-CM

## 2023-07-09 DIAGNOSIS — F1010 Alcohol abuse, uncomplicated: Secondary | ICD-10-CM | POA: Diagnosis present

## 2023-07-09 DIAGNOSIS — I5042 Chronic combined systolic (congestive) and diastolic (congestive) heart failure: Secondary | ICD-10-CM | POA: Diagnosis not present

## 2023-07-09 DIAGNOSIS — I10 Essential (primary) hypertension: Secondary | ICD-10-CM | POA: Diagnosis not present

## 2023-07-09 DIAGNOSIS — G4733 Obstructive sleep apnea (adult) (pediatric): Secondary | ICD-10-CM | POA: Diagnosis present

## 2023-07-09 DIAGNOSIS — N400 Enlarged prostate without lower urinary tract symptoms: Secondary | ICD-10-CM | POA: Diagnosis present

## 2023-07-09 DIAGNOSIS — I9589 Other hypotension: Secondary | ICD-10-CM | POA: Diagnosis present

## 2023-07-09 DIAGNOSIS — D509 Iron deficiency anemia, unspecified: Secondary | ICD-10-CM | POA: Diagnosis present

## 2023-07-09 DIAGNOSIS — Z515 Encounter for palliative care: Secondary | ICD-10-CM | POA: Diagnosis not present

## 2023-07-09 DIAGNOSIS — I739 Peripheral vascular disease, unspecified: Secondary | ICD-10-CM | POA: Diagnosis not present

## 2023-07-09 DIAGNOSIS — Z794 Long term (current) use of insulin: Secondary | ICD-10-CM

## 2023-07-09 DIAGNOSIS — Z9862 Peripheral vascular angioplasty status: Secondary | ICD-10-CM

## 2023-07-09 DIAGNOSIS — I5021 Acute systolic (congestive) heart failure: Secondary | ICD-10-CM

## 2023-07-09 DIAGNOSIS — R079 Chest pain, unspecified: Principal | ICD-10-CM

## 2023-07-09 DIAGNOSIS — I25110 Atherosclerotic heart disease of native coronary artery with unstable angina pectoris: Secondary | ICD-10-CM | POA: Diagnosis not present

## 2023-07-09 DIAGNOSIS — Z9842 Cataract extraction status, left eye: Secondary | ICD-10-CM

## 2023-07-09 DIAGNOSIS — I5023 Acute on chronic systolic (congestive) heart failure: Secondary | ICD-10-CM | POA: Diagnosis not present

## 2023-07-09 DIAGNOSIS — E785 Hyperlipidemia, unspecified: Secondary | ICD-10-CM | POA: Diagnosis present

## 2023-07-09 DIAGNOSIS — E871 Hypo-osmolality and hyponatremia: Secondary | ICD-10-CM | POA: Diagnosis present

## 2023-07-09 DIAGNOSIS — I110 Hypertensive heart disease with heart failure: Secondary | ICD-10-CM | POA: Diagnosis present

## 2023-07-09 DIAGNOSIS — I2510 Atherosclerotic heart disease of native coronary artery without angina pectoris: Secondary | ICD-10-CM

## 2023-07-09 DIAGNOSIS — E1151 Type 2 diabetes mellitus with diabetic peripheral angiopathy without gangrene: Secondary | ICD-10-CM | POA: Diagnosis present

## 2023-07-09 DIAGNOSIS — F419 Anxiety disorder, unspecified: Secondary | ICD-10-CM | POA: Diagnosis present

## 2023-07-09 DIAGNOSIS — Z9841 Cataract extraction status, right eye: Secondary | ICD-10-CM

## 2023-07-09 DIAGNOSIS — G4700 Insomnia, unspecified: Secondary | ICD-10-CM | POA: Diagnosis present

## 2023-07-09 DIAGNOSIS — I5022 Chronic systolic (congestive) heart failure: Secondary | ICD-10-CM | POA: Diagnosis present

## 2023-07-09 DIAGNOSIS — Z89511 Acquired absence of right leg below knee: Secondary | ICD-10-CM

## 2023-07-09 DIAGNOSIS — Z66 Do not resuscitate: Secondary | ICD-10-CM | POA: Diagnosis present

## 2023-07-09 DIAGNOSIS — Z7902 Long term (current) use of antithrombotics/antiplatelets: Secondary | ICD-10-CM

## 2023-07-09 DIAGNOSIS — I5043 Acute on chronic combined systolic (congestive) and diastolic (congestive) heart failure: Secondary | ICD-10-CM | POA: Diagnosis present

## 2023-07-09 DIAGNOSIS — I272 Pulmonary hypertension, unspecified: Secondary | ICD-10-CM | POA: Diagnosis present

## 2023-07-09 DIAGNOSIS — Z95820 Peripheral vascular angioplasty status with implants and grafts: Secondary | ICD-10-CM

## 2023-07-09 DIAGNOSIS — R531 Weakness: Secondary | ICD-10-CM

## 2023-07-09 DIAGNOSIS — Z8249 Family history of ischemic heart disease and other diseases of the circulatory system: Secondary | ICD-10-CM

## 2023-07-09 DIAGNOSIS — E1165 Type 2 diabetes mellitus with hyperglycemia: Secondary | ICD-10-CM | POA: Diagnosis present

## 2023-07-09 DIAGNOSIS — Z7982 Long term (current) use of aspirin: Secondary | ICD-10-CM

## 2023-07-09 DIAGNOSIS — Z8269 Family history of other diseases of the musculoskeletal system and connective tissue: Secondary | ICD-10-CM

## 2023-07-09 HISTORY — DX: Chronic systolic (congestive) heart failure: I50.22

## 2023-07-09 LAB — COMPREHENSIVE METABOLIC PANEL
ALT: 14 U/L (ref 0–44)
AST: 21 U/L (ref 15–41)
Albumin: 3.5 g/dL (ref 3.5–5.0)
Alkaline Phosphatase: 83 U/L (ref 38–126)
Anion gap: 6 (ref 5–15)
BUN: 21 mg/dL (ref 8–23)
CO2: 27 mmol/L (ref 22–32)
Calcium: 9.5 mg/dL (ref 8.9–10.3)
Chloride: 99 mmol/L (ref 98–111)
Creatinine, Ser: 1.01 mg/dL (ref 0.61–1.24)
GFR, Estimated: >60 mL/min (ref >60)
Glucose, Bld: 288 mg/dL — ABNORMAL HIGH (ref 70–99)
Potassium: 5.3 mmol/L — ABNORMAL HIGH (ref 3.5–5.1)
Sodium: 132 mmol/L — ABNORMAL LOW (ref 135–145)
Total Bilirubin: 0.9 mg/dL (ref 0.0–1.2)
Total Protein: 7.2 g/dL (ref 6.5–8.1)

## 2023-07-09 LAB — HEPARIN LEVEL (UNFRACTIONATED): Heparin Unfractionated: 0.15 [IU]/mL — ABNORMAL LOW (ref 0.30–0.70)

## 2023-07-09 LAB — LIPASE, BLOOD: Lipase: 23 U/L (ref 11–51)

## 2023-07-09 LAB — CBC
HCT: 38 % — ABNORMAL LOW (ref 39.0–52.0)
Hemoglobin: 11.8 g/dL — ABNORMAL LOW (ref 13.0–17.0)
MCH: 24.8 pg — ABNORMAL LOW (ref 26.0–34.0)
MCHC: 31.1 g/dL (ref 30.0–36.0)
MCV: 80 fL (ref 80.0–100.0)
Platelets: 293 10*3/uL (ref 150–400)
RBC: 4.75 MIL/uL (ref 4.22–5.81)
RDW: 16.3 % — ABNORMAL HIGH (ref 11.5–15.5)
WBC: 8.7 10*3/uL (ref 4.0–10.5)
nRBC: 0 % (ref 0.0–0.2)

## 2023-07-09 LAB — CBG MONITORING, ED
Glucose-Capillary: 218 mg/dL — ABNORMAL HIGH (ref 70–99)
Glucose-Capillary: 163 mg/dL — ABNORMAL HIGH (ref 70–99)

## 2023-07-09 LAB — TROPONIN I (HIGH SENSITIVITY)
Troponin I (High Sensitivity): 1045 ng/L (ref <18)
Troponin I (High Sensitivity): 944 ng/L (ref <18)
Troponin I (High Sensitivity): 895 ng/L (ref <18)

## 2023-07-09 LAB — PROTIME-INR
INR: 1.4 — ABNORMAL HIGH (ref 0.8–1.2)
Prothrombin Time: 17.4 s — ABNORMAL HIGH (ref 11.4–15.2)

## 2023-07-09 LAB — APTT
aPTT: 29 s (ref 24–36)
aPTT: 46 s — ABNORMAL HIGH (ref 24–36)

## 2023-07-09 LAB — BRAIN NATRIURETIC PEPTIDE: B Natriuretic Peptide: 2395.3 pg/mL — ABNORMAL HIGH (ref 0.0–100.0)

## 2023-07-09 MED ORDER — HEPARIN BOLUS VIA INFUSION
2700.0000 [IU] | Freq: Once | INTRAVENOUS | Status: AC
  Administered 2023-07-09: 2700 [IU] via INTRAVENOUS
  Filled 2023-07-09: qty 2700

## 2023-07-09 MED ORDER — LORAZEPAM 2 MG/ML IJ SOLN: 0.5000 mg | Freq: Four times a day (QID) | INTRAMUSCULAR | Status: DC | PRN

## 2023-07-09 MED ORDER — THIAMINE MONONITRATE 100 MG PO TABS
100.0000 mg | ORAL_TABLET | Freq: Every day | ORAL | Status: DC
  Administered 2023-07-09 – 2023-07-16 (×8): 100 mg via ORAL
  Filled 2023-07-09 (×8): qty 1

## 2023-07-09 MED ORDER — INSULIN ASPART 100 UNIT/ML IJ SOLN
0.0000 [IU] | Freq: Three times a day (TID) | INTRAMUSCULAR | Status: DC
  Administered 2023-07-09: 3 [IU] via SUBCUTANEOUS
  Administered 2023-07-10 – 2023-07-11 (×4): 2 [IU] via SUBCUTANEOUS
  Administered 2023-07-12: 1 [IU] via SUBCUTANEOUS
  Administered 2023-07-12 (×2): 2 [IU] via SUBCUTANEOUS
  Administered 2023-07-13: 3 [IU] via SUBCUTANEOUS
  Administered 2023-07-13 (×2): 2 [IU] via SUBCUTANEOUS
  Administered 2023-07-14: 1 [IU] via SUBCUTANEOUS
  Administered 2023-07-15: 5 [IU] via SUBCUTANEOUS
  Administered 2023-07-15 – 2023-07-16 (×2): 3 [IU] via SUBCUTANEOUS
  Filled 2023-07-09 (×15): qty 1

## 2023-07-09 MED ORDER — ADULT MULTIVITAMIN W/MINERALS CH
1.0000 | ORAL_TABLET | Freq: Every day | ORAL | Status: DC
  Administered 2023-07-09 – 2023-07-16 (×8): 1 via ORAL
  Filled 2023-07-09 (×8): qty 1

## 2023-07-09 MED ORDER — ACETAMINOPHEN 650 MG RE SUPP: 650.0000 mg | Freq: Four times a day (QID) | RECTAL | Status: AC | PRN

## 2023-07-09 MED ORDER — ONDANSETRON HCL 4 MG PO TABS: 4.0000 mg | ORAL_TABLET | Freq: Four times a day (QID) | ORAL | Status: AC | PRN

## 2023-07-09 MED ORDER — FUROSEMIDE 10 MG/ML IJ SOLN
60.0000 mg | Freq: Two times a day (BID) | INTRAMUSCULAR | Status: DC
  Administered 2023-07-09 – 2023-07-15 (×12): 60 mg via INTRAVENOUS
  Filled 2023-07-09 (×5): qty 6
  Filled 2023-07-09: qty 8
  Filled 2023-07-09 (×3): qty 6
  Filled 2023-07-09: qty 8
  Filled 2023-07-09: qty 6
  Filled 2023-07-09: qty 8

## 2023-07-09 MED ORDER — ATORVASTATIN CALCIUM 80 MG PO TABS
80.0000 mg | ORAL_TABLET | Freq: Every day | ORAL | Status: DC
  Administered 2023-07-09 – 2023-07-15 (×7): 80 mg via ORAL
  Filled 2023-07-09 (×6): qty 1
  Filled 2023-07-09: qty 4

## 2023-07-09 MED ORDER — ACETAMINOPHEN 325 MG PO TABS
650.0000 mg | ORAL_TABLET | Freq: Four times a day (QID) | ORAL | Status: AC | PRN
  Administered 2023-07-09 – 2023-07-14 (×2): 650 mg via ORAL
  Filled 2023-07-09: qty 2

## 2023-07-09 MED ORDER — ONDANSETRON HCL 4 MG/2ML IJ SOLN: 4.0000 mg | Freq: Four times a day (QID) | INTRAMUSCULAR | Status: AC | PRN

## 2023-07-09 MED ORDER — NITROGLYCERIN IN D5W 200-5 MCG/ML-% IV SOLN
0.0000 ug/min | INTRAVENOUS | Status: DC
  Administered 2023-07-09: 5 ug/min via INTRAVENOUS
  Filled 2023-07-09: qty 250

## 2023-07-09 MED ORDER — HEPARIN (PORCINE) 25000 UT/250ML-% IV SOLN
1550.0000 [IU]/h | INTRAVENOUS | Status: DC
  Administered 2023-07-09: 1050 [IU]/h via INTRAVENOUS
  Administered 2023-07-10: 1300 [IU]/h via INTRAVENOUS
  Filled 2023-07-09 (×2): qty 250

## 2023-07-09 MED ORDER — FOLIC ACID 1 MG PO TABS
1.0000 mg | ORAL_TABLET | Freq: Every day | ORAL | Status: DC
  Administered 2023-07-09 – 2023-07-16 (×8): 1 mg via ORAL
  Filled 2023-07-09 (×8): qty 1

## 2023-07-09 MED ORDER — THIAMINE HCL 100 MG/ML IJ SOLN: 100.0000 mg | Freq: Every day | INTRAMUSCULAR | Status: DC

## 2023-07-09 MED ORDER — TRAZODONE HCL 100 MG PO TABS
100.0000 mg | ORAL_TABLET | Freq: Every day | ORAL | Status: DC
Start: 2023-07-09 — End: 2023-07-16
  Administered 2023-07-09 – 2023-07-15 (×7): 100 mg via ORAL
  Filled 2023-07-09 (×7): qty 1

## 2023-07-09 MED ORDER — HEPARIN BOLUS VIA INFUSION
4000.0000 [IU] | Freq: Once | INTRAVENOUS | Status: AC
  Administered 2023-07-09: 4000 [IU] via INTRAVENOUS
  Filled 2023-07-09: qty 4000

## 2023-07-09 MED ORDER — MORPHINE SULFATE (PF) 2 MG/ML IV SOLN
2.0000 mg | INTRAVENOUS | Status: DC | PRN
  Administered 2023-07-10: 2 mg via INTRAVENOUS
  Filled 2023-07-09: qty 1

## 2023-07-09 MED ORDER — LORAZEPAM 1 MG PO TABS: 1.0000 mg | ORAL_TABLET | ORAL | Status: DC | PRN

## 2023-07-09 MED ORDER — INSULIN ASPART 100 UNIT/ML IJ SOLN
0.0000 [IU] | Freq: Every day | INTRAMUSCULAR | Status: DC
  Administered 2023-07-12: 2 [IU] via SUBCUTANEOUS
  Filled 2023-07-09: qty 1

## 2023-07-09 MED ORDER — ASPIRIN 81 MG PO CHEW
324.0000 mg | CHEWABLE_TABLET | Freq: Once | ORAL | Status: AC
  Administered 2023-07-09: 324 mg via ORAL
  Filled 2023-07-09: qty 4

## 2023-07-09 MED ORDER — NITROGLYCERIN 2 % TD OINT: 1.0000 [in_us] | TOPICAL_OINTMENT | Freq: Four times a day (QID) | TRANSDERMAL | Status: DC | PRN

## 2023-07-09 MED ORDER — LORAZEPAM 2 MG/ML IJ SOLN: 2.0000 mg | INTRAMUSCULAR | Status: DC | PRN

## 2023-07-09 MED ORDER — LORAZEPAM 2 MG/ML IJ SOLN: 1.0000 mg | INTRAMUSCULAR | Status: DC | PRN

## 2023-07-09 NOTE — Assessment & Plan Note (Signed)
 Home atorvastatin 80 mg nightly resumed on admission

## 2023-07-09 NOTE — Hospital Course (Signed)
 [name] is a 78 year old male with history of CAD, PAD, history of right BKA, hyperlipidemia, hypertension, depression, anxiety, insomnia, who presents to the emergency department for chief concerns of shortness of breath, decreased urine output, and inability to sleep for the last year.  Vitals in the ED showed temperature of 98.3, respiration rate of 18, heart rate 68, blood pressure 144/93, SpO2 of 100% on 2 L nasal cannula.  Serum sodium is 132, potassium 5.3, chloride 99, bicarb 27, BUN of 21, serum creatinine of 1.01, EGFR greater than 60, nonfasting blood glucose 288, WBC 8.7, hemoglobin 11.8, platelets of 293.  High sensitivity troponin is 1045.  ED treatment: Heparin bolus and infusion

## 2023-07-09 NOTE — TOC Progression Note (Signed)
 Transition of Care [facility]) - Progression Note    Patient Details  Name: Manuel [name] MRN: [id number] Date of Birth: 1946/03/05  [facility] of [facility]) CM/SW Contact  Colin Broach, LCSW Phone Number: 07/09/2023, 8:31 PM  Clinical Narrative:   CSW completed readmission prevention assessment at bedside with patient and wife, Manuel [name], in the room.  Patient's PCP is Michaela Corner, MD who patient states that he hasn't seen in a while due to being referred to several specialist.  He is a recent amputee who reports that he has an appointment scheduled with a Psychologist to discuss depression symptoms that he's been experiencing.  Patient reports that he has no concerns with paying for his medications and that he uses [employer].  Patient lives in the home with his wife.  He reports that he uses 2L [place] at home.  He has the following Dme:  wheelchair, shower chair, BSC and walker.  CSW obtained permission to discuss the consult for substance use resources with his wife in the room.  Patient was amenable to her being a part of the discussion.  Patient declined the substance use resources at this time.  TOC to continue to follow for discharge planning.         Expected Discharge Plan and Services                                               Social Determinants of Health (SDOH) Interventions SDOH Screenings   Food Insecurity: No Food Insecurity (06/05/2023)   Received from [facility]  Housing: Low Risk  (06/26/2023)   Received from [facility]  Transportation Needs: No Transportation Needs (06/05/2023)   Received from [facility]  Utilities: Not At Risk (06/05/2023)   Received from [facility]  Financial Resource Strain: Low Risk  (06/05/2023)   Received from [facility]  Social Connections: Unknown (04/22/2023)  Tobacco Use: Low Risk  (07/09/2023)  Recent Concern: Tobacco Use -  Medium Risk (06/26/2023)   Received from [facility] [place] [facility]    Readmission Risk Interventions    07/09/2023    8:30 PM 02/18/2023   11:58 AM 02/15/2023    4:10 PM  Readmission Risk Prevention Plan  Transportation Screening Complete  Complete  PCP or Specialist Appt within 3-5 Days  Complete Complete  HRI or Home Care Consult  Complete Complete  Social Work Consult for Recovery Care Planning/Counseling  Complete Complete  Palliative Care Screening  Not Applicable Not Applicable  Medication Review [employer]) Complete Complete Complete  HRI or Home Care Consult Complete    SW Recovery Care/Counseling Consult Complete    Palliative Care Screening Not Applicable    Skilled Nursing Facility Not Applicable

## 2023-07-09 NOTE — Consult Note (Signed)
 [name] C[facility] CONSULT NOTE       Patient ID: [name] MRN: [id number] DOB/AGE: 80-Apr-1947 80 y.o.  Admit date: 07/09/2023 Referring Physician Dr. Londell Moh Primary Physician Gracelyn Nurse, MD  Primary Cardiologist Dr. Lorelle Gibbs, Dr. Allena Katz Reason for Consultation NSTEMI, SOB  HPI: [name] is a 78 y.o. 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 Troponins elevated. Cardiology was consulted for further evaluation.   Patient states that for the last week he has had worsening shortness of breath and chest pain.  States that the chest pain is located in the center of his chest but is sometimes down in his abdomen. Also has noticed worsening swelling in his leg and distention in his belly. Given persistent notes he decided to come to the ED for further evaluation.  Workup in the ED notable for creatinine 1.01, potassium 5.3, sodium 132, hemoglobin 11.8, WBC 8.7.  Troponin checked and trended 1045 > 944.  Chest x-ray with pulmonary vascular congestion.  He was started on IV heparin and IV nitroglycerin in the ED.  At the time of my evaluation patient is sitting upright in ED stretcher with wife present at bedside.  We discussed his recent symptoms in further detail.  He endorses worsening shortness of breath with associated chest pain.  Also has noted worsening lower extremity edema for his left leg, status post BKA right leg.  Feels he has also been having worsening swelling in his abdomen for the last few weeks.  BNP was ordered by myself after I saw him and this came back elevated at 2395.  Has not received any IV Lasix thus far.  Review of systems complete and found to be  negative unless listed above    Past Medical History:  Diagnosis Date   Acute ST elevation myocardial infarction (STEMI) of inferior wall (HCC) 04/19/2010   a.) transfered from [facility] to [facility] --> LHC/PCI (very difficult procedure) --> 3.0 x 23 mm and 3.0 x 12 mm Xience stents to RCA   Allergies    Arthritis    Benign essential hypertension    Bilateral carotid artery disease (HCC) 05/08/2021   a.) carotid doppler 05/08/2021: 1-39% BICA   CAD (coronary artery disease) 04/19/2010   a.) inferior STEMI 04/19/2010 --> LHC/PCI: 50-70% pD1, 80% pRI, 90/90/90% RCA (overlapping 3.0 x 23 and 3.0 x 12 mm Xience DES); b.) MV 11/10/2018: fixed minimally reversible inferior perfusion defect   Cellulitis of foot    DDD (degenerative disc disease), cervical    Diabetes mellitus type 2, insulin dependent (HCC)    Diverticulosis    Full dentures    Gout    Hard of hearing    History of bilateral cataract extraction 2022   History of ETOH abuse    Hyperlipidemia    Ischemic cardiomyopathy 04/19/2010   a.) TTE 04/19/2010: 40%; b.) TTE 04/20/2014: EF >55%, mild RVE, triv PR, mild MR/TR, G1DD; c.) TTE 11/10/2018: EF 45%, inf HK, mild RVE, triv TR/PR, mild MR, G1DD; d.) TTE 11/14/2022: EF 25-30%, basal anteroseptal, apical lateral, apical septal, and apex AK, mid anterolateral HK, mild-mod MR/TR   Long term current use of aspirin    Long  term current use of clopidogrel    Lumbar degenerative disc disease    Lumbar radiculopathy    Lumbar vertebral fracture (chronic superior endplate of L1)    NSTEMI (non-ST elevated myocardial infarction) (HCC) 01/15/2023   OSA (obstructive sleep apnea)    a.) unable to tolerate nocturnal PAP therapy   Peripheral artery disease (HCC)    a.) stenting 05/14/21: 12 mm x 12 cm LifeStent RIGHT dis SFA/prox pop; b.) s/p cath directed thrombolysis RIGHT SFA/pop 06/14/21; c.) s/p mech thrombectomy + stenting 06/15/21: 8 mm x 25cm & 8 mm x 7.5cm Viabahn; d.) s/p BILAT CFA, profunda  femoris, SFA endarterectomies + fogarty embolectomy + stenting 11/15/22: 12mm x 58mm Lifestream BILAT CIAs, 14 mm x 6 cm Lifestream & 13 mm x 5 cm Viabahn LEFT EIA   Peripheral neuropathy    Umbilical hernia     Past Surgical History:  Procedure Laterality Date   AMPUTATION Right 11/18/2022   Procedure: AMPUTATION 4TH AND 5TH RAY;  Surgeon: Linus Galas, [name];  Location: [facility];  Service: Orthopedics/Podiatry;  Laterality: Right;  4th and 5th toe   AMPUTATION Right 04/20/2023   Procedure: AMPUTATION BELOW KNEE;  Surgeon: Bertram Denver, MD;  Location: [facility];  Service: Vascular;  Laterality: Right;  block then general   APPLICATION OF WOUND VAC Right 01/09/2023   Procedure: APPLICATION OF WOUND VAC;  Surgeon: Annice Needy, MD;  Location: [facility];  Service: Vascular;  Laterality: Right;   CATARACT EXTRACTION W/PHACO Right 03/14/2021   Procedure: CATARACT EXTRACTION PHACO AND INTRAOCULAR LENS PLACEMENT (IOC) RIGHT DIABETIC;  Surgeon: Lockie Mola, MD;  Location: [facility];  Service: Ophthalmology;  Laterality: Right;  Diabetic 16.78 01:39.9   CATARACT EXTRACTION W/PHACO Left 03/28/2021   Procedure: CATARACT EXTRACTION PHACO AND INTRAOCULAR LENS PLACEMENT (IOC) LEFT DIABETIC 6.93 01:22.0;  Surgeon: Lockie Mola, MD;  Location: [name];  Service: Ophthalmology;  Laterality: Left;  Diabetic   COLONOSCOPY     CORONARY ANGIOPLASTY WITH STENT PLACEMENT  03/2010   Procedure: CORONARY ANGIOPLASTY WITH STENT PLACEMENT; Location: [name]   ENDARTERECTOMY FEMORAL Bilateral 11/15/2022   Procedure: BILATERAL COMMON FEMORAL PROFUNDA FEMORIS AND SUPERFICIAL FEMORAL ARTERY ENDARTECTOMIES, RIGHT FOGARTY EMBOLECTOMY OF THE RIGHT SFA  AND  POPLITEAL ARTERIES. AORTAGRAM AND RIGHT LOWER EXTREMITY ANGIOGRAM.;  Surgeon: Annice Needy, MD;  Location: [facility];  Service: Vascular;  Laterality: Bilateral;   INSERTION OF ILIAC STENT Bilateral 11/15/2022   Procedure: BILATERAL STENT  INSERTION IN BILATERAL  COMMON ILIAC ARTERY, STENT INSERTION OF LEFT EXTERNAL ILIAC ARTERY. ANGIOPLASTY RIGHT TIBIAL  AND POPLITEAL ARTERY.;  Surgeon: Annice Needy, MD;  Location: [facility];  Service: Vascular;  Laterality: Bilateral;   IRRIGATION AND DEBRIDEMENT KNEE Right 04/21/2023   Procedure: IRRIGATION AND DEBRIDEMENT KNEE;  Surgeon: Campbell Stall, MD;  Location: [facility];  Service: Orthopedics;  Laterality: Right;   LOWER EXTREMITY ANGIOGRAPHY Right 05/14/2021   Procedure: LOWER EXTREMITY ANGIOGRAPHY;  Surgeon: Annice Needy, MD;  Location: [facility];  Service: Cardiovascular;  Laterality: Right;   LOWER EXTREMITY ANGIOGRAPHY Right 06/14/2021   Procedure: Lower Extremity Angiography;  Surgeon: Annice Needy, MD;  Location: [facility];  Service: Cardiovascular;  Laterality: Right;   LOWER EXTREMITY ANGIOGRAPHY Right 11/11/2022   Procedure: Lower Extremity Angiography;  Surgeon: Annice Needy, MD;  Location: [facility];  Service: Cardiovascular;  Laterality: Right;   LOWER EXTREMITY INTERVENTION Right 06/15/2021   Procedure: LOWER EXTREMITY INTERVENTION;  Surgeon: Wyn Quaker,  Marlow Baars, MD;  Location: [facility];  Service: Cardiovascular;  Laterality: Right;   TONSILLECTOMY     TRANSMETATARSAL AMPUTATION Right 02/17/2023   Procedure: TRANSMETATARSAL AMPUTATION;  Surgeon: Rosetta Posner, [name];  Location: [facility];  Service: Orthopedics/Podiatry;  Laterality: Right;   WOUND DEBRIDEMENT Right 01/09/2023   Procedure: DEBRIDEMENT WOUND;  Surgeon: Annice Needy, MD;  Location: [facility];  Service: Vascular;  Laterality: Right;    (Not in a [name] admission)  Social History   Socioeconomic History   Marital status: Married    Spouse name: [name]   Number of children: 3   Years of education: Not on file   Highest education level: Not on file  Occupational History   Not on file  Tobacco Use   Smoking status: Never   Smokeless tobacco: Never  Vaping Use    Vaping status: Never Used  Substance and Sexual Activity   Alcohol use: Yes    Alcohol/week: 21.0 standard drinks of alcohol    Types: 7 Glasses of wine, 14 Cans of beer per week    Comment: occassional   Drug use: No   Sexual activity: Not on file  Other Topics Concern   Not on file  Social History Narrative   Not on file   Social Drivers of Health   Financial Resource Strain: Low Risk  (06/05/2023)   Received from [facility]   Overall Financial Resource Strain (CARDIA)    Difficulty of Paying Living Expenses: Not hard at all  Food Insecurity: No Food Insecurity (06/05/2023)   Received from [facility]   Hunger Vital Sign    Worried About Running Out of Food in the Last Year: Never true    Ran Out of Food in the Last Year: Never true  Transportation Needs: No Transportation Needs (06/05/2023)   Received from [name] - Transportation    In the past 12 months, has lack of transportation kept you from medical appointments or from getting medications?: No    Lack of Transportation (Non-Medical): No  Physical Activity: Not on file  Stress: Not on file  Social Connections: Unknown (04/22/2023)   Social Connection and Isolation Panel [NHANES]    Frequency of Communication with Friends and Family: More than three times a week    Frequency of Social Gatherings with Friends and Family: More than three times a week    Attends Religious Services: Patient declined    Active Member of Clubs or Organizations: No    Attends Banker Meetings: Patient declined    Marital Status: Married  Catering manager Violence: Not At Risk (04/19/2023)   Humiliation, Afraid, Rape, and Kick questionnaire    Fear of Current or Ex-Partner: No    Emotionally Abused: No    Physically Abused: No    Sexually Abused: No    Family History  Problem Relation Age of Onset   Scoliosis Mother    Heart disease Father      Vitals:    07/09/23 1146 07/09/23 1330 07/09/23 1441 07/09/23 1559  BP: (!) 144/93     Pulse: 68  69   Resp: 18     Temp: 98.3 F (36.8 C)   97.8 F (36.6 C)  TempSrc: Oral   Oral  SpO2: 100%     Weight:  95.7 kg    Height:  5' 7.99" (1.727 m)      PHYSICAL EXAM General: Well appearing, well  nourished, in no acute distress. HEENT: Normocephalic and atraumatic. Neck: No JVD.  Lungs: Normal respiratory effort on 2L . Bibasilar crackles.  Heart: HRRR. Normal S1 and S2 without gallops or murmurs.  Abdomen: Distention noted. Msk: Normal strength and tone for age. Extremities: Warm and well perfused. No clubbing, cyanosis. 2+ pitting edema up to L knee. S/p R BKA.  Neuro: Alert and oriented X 3. Psych: Answers questions appropriately.   Labs: Basic Metabolic Panel: Recent Labs    07/09/23 1152  NA 132*  K 5.3*  CL 99  CO2 27  GLUCOSE 288*  BUN 21  CREATININE 1.01  CALCIUM 9.5   Liver Function Tests: Recent Labs    07/09/23 1152  AST 21  ALT 14  ALKPHOS 83  BILITOT 0.9  PROT 7.2  ALBUMIN 3.5   Recent Labs    07/09/23 1152  LIPASE 23   CBC: Recent Labs    07/09/23 1152  WBC 8.7  HGB 11.8*  HCT 38.0*  MCV 80.0  PLT 293   Cardiac Enzymes: Recent Labs    07/09/23 1152 07/09/23 1339  TROPONINIHS 1,045* 944*   BNP: Recent Labs    07/09/23 1152  BNP 2,395.3*   D-Dimer: No results for input(s): "DDIMER" in the last 72 hours. Hemoglobin A1C: No results for input(s): "HGBA1C" in the last 72 hours. Fasting Lipid Panel: No results for input(s): "CHOL", "HDL", "LDLCALC", "TRIG", "CHOLHDL", "LDLDIRECT" in the last 72 hours. Thyroid Function Tests: No results for input(s): "TSH", "T4TOTAL", "T3FREE", "THYROIDAB" in the last 72 hours.  Invalid input(s): "FREET3" Anemia Panel: No results for input(s): "VITAMINB12", "FOLATE", "FERRITIN", "TIBC", "IRON", "RETICCTPCT" in the last 72 hours.   Radiology: DG Chest 2 View Result Date: 07/09/2023 CLINICAL DATA:   Shortness of breath. EXAM: CHEST - 2 VIEW COMPARISON:  01/15/2023. FINDINGS: Low lung volume. Mild diffuse vascular congestion. There is subtle blunting of bilateral costophrenic angles, suggesting bilateral trace pleural effusions. Bilateral lung fields are clear. Bilateral costophrenic angles are clear. Stable cardio-mediastinal silhouette. No acute osseous abnormalities. The soft tissues are within normal limits. IMPRESSION: Mild diffuse vascular congestion and bilateral trace pleural effusions, concerning for congestive heart failure/pulmonary edema. Number Correlate clinically. Electronically Signed   By: Jules Schick M.D.   On: 07/09/2023 15:10    ECHO ordered  TELEMETRY reviewed by me 07/09/2023: sinus rhythm rate 70s  EKG reviewed by me: sinus rhythm rate 66 bpm  Data reviewed by me 07/09/2023: last 24h vitals tele labs imaging I/O ED provider note, admission H&P  Principal Problem:   NSTEMI (non-ST elevated myocardial infarction) (HCC) Active Problems:   Coronary artery disease with history of myocardial infarction without history of CABG   Insulin dependent type 2 diabetes mellitus (HCC)   S/P angioplasty with stent   PAD (peripheral artery disease) (HCC)   Alcohol abuse   Adjustment disorder with depressed mood   Generalized weakness   Essential hypertension   Dyslipidemia   DNR (do not resuscitate)/DNI(Do Not Intubate(   S/P BKA (below knee amputation), 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  Troponins elevated. Cardiology was consulted for further evaluation.   # Acute on  chronic HFrEF # Shortness of breath Patient presented to the ED with complaints of chest pain and worsening shortness of breath for the last week..  Chest x-ray in the ED with vascular congestion, BNP elevated at 2395.  Placed on supplemental oxygen at 2 L [place]. -Start IV Lasix 60 mg twice daily. -Hold home GDMT at this time.  Will resume as BP and renal function allow. -Monitor renal function closely with diuresis. -Monitor and replenish electrolytes for a goal K >4, Mag >2    # NSTEMI, ?type I vs type II # Coronary artery disease Patient presenting with CP symptoms which have been worsening with worsening SOB. Troponins 1045 > 944.  EKG demonstrates normal sinus rhythm rate 66 bpm, poor R wave progression but otherwise no acute ST-T changes.  Patient started on heparin infusion. -Continue heparin infusion. -Nitro infusion ordered by primary, will evaluate his response to this. -Unsure of whether or chest pain and troponin elevation symptoms are from true NSTEMI versus demand secondary to acute heart failure.  Will assess response to IV Lasix and reevaluate tomorrow morning.  # Severe PVD with LE ulceration # Diabetic neuropathy S/p multiple interventions in the past and R BKA.  -Continue home statin, plavix.    TIMI Risk Score for Unstable Angina or Non-ST Elevation MI:   The patient's TIMI risk score is 6, which indicates a 41% risk of all cause mortality, new or recurrent myocardial infarction or need for urgent revascularization in the next 14 days.   This patient's plan of care was discussed and created with Dr. Darrold Junker and he is in agreement.  Signed: Gale Journey, PA-C  07/09/2023, 4:15 PM [facility]

## 2023-07-09 NOTE — Assessment & Plan Note (Signed)
 Per patient approximately 15-16 years ago

## 2023-07-09 NOTE — ED Provider Notes (Signed)
 [facility] Provider Note    Event Date/Time   First MD Initiated Contact with Patient 07/09/23 1308     (approximate)   History   Multiple complaints    HPI  [name] is a 78 y.o. male significant history of CAD, PAD status post right BKA presents to the ER for evaluation of multiple complaints but primarily being midsternal nonradiating chest pain and pressure over the past 24 hours.  Does feel like previous MIs.  He is also complaining of over 1 year of poor sleep.  He is uncertain as to what medications he has been taking.  He denies any nausea or vomiting.  41     Physical Exam   Triage Vital Signs: ED Triage Vitals  Encounter Vitals Group     BP 07/09/23 1146 (!) 144/93     Systolic BP Percentile --      Diastolic BP Percentile --      Pulse Rate 07/09/23 1146 68     Resp 07/09/23 1146 18     Temp 07/09/23 1146 98.3 F (36.8 C)     Temp Source 07/09/23 1146 Oral     SpO2 07/09/23 1146 100 %     Weight --      Height --      Head Circumference --      Peak Flow --      Pain Score 07/09/23 1150 7     Pain Loc --      Pain Education --      Exclude from Growth Chart --     Most recent vital signs: Vitals:   07/09/23 1146  BP: (!) 144/93  Pulse: 68  Resp: 18  Temp: 98.3 F (36.8 C)  SpO2: 100%     Constitutional: Alert  Eyes: Conjunctivae are normal.  Head: Atraumatic. Nose: No congestion/rhinnorhea. Mouth/Throat: Mucous membranes are moist.   Neck: Painless ROM.  Cardiovascular:   Good peripheral circulation.  No m/r/g Respiratory: Normal respiratory effort.  No retractions.  Gastrointestinal: Soft and nontender.  Musculoskeletal:  no deformity Neurologic:  MAE spontaneously. No gross focal neurologic deficits are appreciated.  Skin:  Skin is warm, dry and intact. No rash noted. Psychiatric: Mood and affect are normal. Speech and behavior are normal.    ED Results / Procedures / Treatments   Labs (all labs  ordered are listed, but only abnormal results are displayed) Labs Reviewed  CBC - Abnormal; Notable for the following components:      Result Value   Hemoglobin 11.8 (*)    HCT 38.0 (*)    MCH 24.8 (*)    RDW 16.3 (*)    All other components within normal limits  COMPREHENSIVE METABOLIC PANEL - Abnormal; Notable for the following components:   Sodium 132 (*)    Potassium 5.3 (*)    Glucose, Bld 288 (*)    All other components within normal limits  TROPONIN I (HIGH SENSITIVITY) - Abnormal; Notable for the following components:   Troponin I (High Sensitivity) 1,045 (*)    All other components within normal limits  LIPASE, BLOOD  TROPONIN I (HIGH SENSITIVITY)     EKG  ED ECG REPORT I, Willy Eddy, the attending physician, personally viewed and interpreted this ECG.   Date: 07/09/2023  EKG Time: 11:57  Rate: 65  Rhythm: sinuis  Axis: normal  Intervals: normal  ST&T Change: no stemi, no depression     RADIOLOGY Please see ED Course for  my review and interpretation.  I personally reviewed all radiographic images ordered to evaluate for the above acute complaints and reviewed radiology reports and findings.  These findings were personally discussed with the patient.  Please see medical record for radiology report.    PROCEDURES:  Critical Care performed: Yes, see critical care procedure note(s)  .Critical Care  Performed by: Willy Eddy, MD Authorized by: Willy Eddy, MD   Critical care provider statement:    Critical care time (minutes):  35   Critical care was necessary to treat or prevent imminent or life-threatening deterioration of the following conditions:  Circulatory failure   Critical care was time spent personally by me on the following activities:  Ordering and performing treatments and interventions, ordering and review of laboratory studies, ordering and review of radiographic studies, pulse oximetry, re-evaluation of patient's condition,  review of old charts, obtaining history from patient or surrogate, examination of patient, evaluation of patient's response to treatment, discussions with primary provider, discussions with consultants and development of treatment plan with patient or surrogate    MEDICATIONS ORDERED IN ED: Medications  aspirin chewable tablet 324 mg (324 mg Oral Given 07/09/23 1331)     IMPRESSION / MDM / ASSESSMENT AND PLAN / ED COURSE  I reviewed the triage vital signs and the nursing notes.                              Differential diagnosis includes, but is not limited to, ACS, pericarditis, esophagitis, boerhaaves, pe, dissection, pna, bronchitis, costochondritis  Patient presenting to the ER for evaluation of symptoms as described above.  Based on symptoms, risk factors and considered above differential, this presenting complaint could reflect a potentially life-threatening illness therefore the patient will be placed on continuous pulse oximetry and telemetry for monitoring.  Laboratory evaluation will be sent to evaluate for the above complaints.  EKG is not nonischemic.  Troponin is elevated but thousand.  With his history certainly concerning for ACS.  Will heparinize will give aspirin.  I have a low suspicion for dissection or PE.  Abdominal exam soft and benign.  Will consult hospitalist for admission.       FINAL CLINICAL IMPRESSION(S) / ED DIAGNOSES   Final diagnoses:  Chest pain, unspecified type     Rx / DC Orders   ED Discharge Orders     None        Note:  This document was prepared using Dragon voice recognition software and may include unintentional dictation errors.    Willy Eddy, MD 07/09/23 [phone]

## 2023-07-09 NOTE — Assessment & Plan Note (Signed)
 -  Insulin SSI with at bedtime coverage ordered ?- Goal inpatient blood glucose levels 140-180 ?

## 2023-07-09 NOTE — Progress Notes (Signed)
 PHARMACY - ANTICOAGULATION CONSULT NOTE  Pharmacy Consult for Heparin Infusion Indication: chest pain/ACS  No Known Allergies  Patient Measurements: Height: 5' 7.99" (172.7 cm) Weight: 95.7 kg (211 lb) IBW/kg (Calculated) : 68.38 Heparin Dosing Weight: 88.5 kg  Vital Signs: Temp: 97.8 F (36.6 C) (03/19 2032) Temp Source: Oral (03/19 2032) BP: 151/99 (03/19 2032) Pulse Rate: 62 (03/19 2032)  Labs: Recent Labs    07/09/23 1152 07/09/23 1339 07/09/23 1407 07/09/23 1615 07/09/23 2038  HGB 11.8*  --   --   --   --   HCT 38.0*  --   --   --   --   PLT 293  --   --   --   --   APTT  --   --  29  --  46*  LABPROT  --   --  17.4*  --   --   INR  --   --  1.4*  --   --   HEPARINUNFRC  --   --   --   --  0.15*  CREATININE 1.01  --   --   --   --   TROPONINIHS 1,045* 944*  --  895*  --     Estimated Creatinine Clearance: 66.5 mL/min (by C-G formula based on SCr of 1.01 mg/dL).   Medical History: Past Medical History:  Diagnosis Date   Acute ST elevation myocardial infarction (STEMI) of inferior wall (HCC) 04/19/2010   a.) transfered from [facility] to [facility] --> LHC/PCI (very difficult procedure) --> 3.0 x 23 mm and 3.0 x 12 mm Xience stents to RCA   Allergies    Arthritis    Benign essential hypertension    Bilateral carotid artery disease (HCC) 05/08/2021   a.) carotid doppler 05/08/2021: 1-39% BICA   CAD (coronary artery disease) 04/19/2010   a.) inferior STEMI 04/19/2010 --> LHC/PCI: 50-70% pD1, 80% pRI, 90/90/90% RCA (overlapping 3.0 x 23 and 3.0 x 12 mm Xience DES); b.) MV 11/10/2018: fixed minimally reversible inferior perfusion defect   Cellulitis of foot    DDD (degenerative disc disease), cervical    Diabetes mellitus type 2, insulin dependent (HCC)    Diverticulosis    Full dentures    Gout    Hard of hearing    History of bilateral cataract extraction 2022   History of ETOH abuse    Hyperlipidemia    Ischemic cardiomyopathy 04/19/2010   a.) TTE 04/19/2010:  40%; b.) TTE 04/20/2014: EF >55%, mild RVE, triv PR, mild MR/TR, G1DD; c.) TTE 11/10/2018: EF 45%, inf HK, mild RVE, triv TR/PR, mild MR, G1DD; d.) TTE 11/14/2022: EF 25-30%, basal anteroseptal, apical lateral, apical septal, and apex AK, mid anterolateral HK, mild-mod MR/TR   Long term current use of aspirin    Long term current use of clopidogrel    Lumbar degenerative disc disease    Lumbar radiculopathy    Lumbar vertebral fracture (chronic superior endplate of L1)    NSTEMI (non-ST elevated myocardial infarction) (HCC) 01/15/2023   OSA (obstructive sleep apnea)    a.) unable to tolerate nocturnal PAP therapy   Peripheral artery disease (HCC)    a.) stenting 05/14/21: 12 mm x 12 cm LifeStent RIGHT dis SFA/prox pop; b.) s/p cath directed thrombolysis RIGHT SFA/pop 06/14/21; c.) s/p mech thrombectomy + stenting 06/15/21: 8 mm x 25cm & 8 mm x 7.5cm Viabahn; d.) s/p BILAT CFA, profunda femoris, SFA endarterectomies + fogarty embolectomy + stenting 11/15/22: 12mm x 58mm Lifestream BILAT CIAs,  14 mm x 6 cm Lifestream & 13 mm x 5 cm Viabahn LEFT EIA   Peripheral neuropathy    Umbilical hernia     Medications:  Not on anticoagulation at home per chart review; previously took apixaban but looks like it was stopped 11/2022  Assessment: Patient is a 78 year old presenting with a chief complaint of mid-sternal, non-radiating chest pain and pressure x 24 hours. Troponin elevated at 1045. Pharmacy has been consulted to initiate patient on a heparin infusion for ACS.   Baseline INR and aPTT ordered.  No signs/symptoms of bleeding noted in chart. Hgb 11.8. PLT 293.  Goal of Therapy:  Heparin level 0.3-0.7 units/ml Monitor platelets by anticoagulation protocol: Yes  03/19 2038 HL 0.15, subtherapeutic   Plan:  Give 2700 unit bolus x 1 Increase heparin infusion rate to 1300 units/hr Recheck HL w/ AM labs after rate change Monitor CBC daily while on heparin  Otelia Sergeant, PharmD,  MBA 07/09/2023 10:00 PM

## 2023-07-09 NOTE — Assessment & Plan Note (Addendum)
 Last ETOH drink was 2-3 days ago, patient is unsure CIWA precaution initiated on admission

## 2023-07-09 NOTE — H&P (Addendum)
 History and Physical   Manuel [name] WGN:[id number] DOB: 1945/06/23 DOA: 07/09/2023  PCP: Gracelyn Nurse, MD Outpatient Specialists: Dr. Florestine Avers, [facility] cardiology Patient coming from: home via POV  I have personally briefly reviewed patient's old medical records in [facility] EMR.  Chief Concern: Shortness of breath, decreased urine output, inability to sleep for 1 year  HPI: Manuel [name] is a 78 year old male with history of CAD, PAD, history of right BKA, hyperlipidemia, hypertension, depression, anxiety, insomnia, who presents to the emergency department for chief concerns of shortness of breath, decreased urine output, and inability to sleep for the last year.  Vitals in the ED showed temperature of 98.3, respiration rate of 18, heart rate 68, blood pressure 144/93, SpO2 of 100% on 2 L nasal cannula.  Serum sodium is 132, potassium 5.3, chloride 99, bicarb 27, BUN of 21, serum creatinine of 1.01, EGFR greater than 60, nonfasting blood glucose 288, WBC 8.7, hemoglobin 11.8, platelets of 293.  High sensitivity troponin is 1045.  ED treatment: Heparin bolus and infusion --------------------------------- At bedside, patient able to tell me his first and last name, age, location, current calendar year.  He reports he has been short of breath for about 1 year and the shortness of breath is worse when he lays down at night.  He reports this has caused him difficulty sleeping for 1 year.  He reports that he does get so tired that he is so short of breath when he lays down that he cannot go to sleep.  He reports that shortness of breath has worsened over the last week.  He reports there is also swelling of his lower extremity (left only).  He denies fever, chills, nausea, vomiting, cough, syncope.  He endorses chest pressure/chest discomfort especially when he lays flat at night.  He denies trauma to his person.  Social history: He lives at home with his wife.  He denies tobacco,  recreational drug use.  He occasionally smokes marijuana.  He reports he has been a lifelong alcoholic drinker, specifically beer.  He reports his last drink was 2 to 3 days ago he is unsure.  He is retired.  ROS: Constitutional: no weight change, no fever ENT/Mouth: no sore throat, no rhinorrhea Eyes: no eye pain, no vision changes Cardiovascular: + chest pain, + dyspnea,  + edema, no palpitations Respiratory: no cough, no sputum, no wheezing Gastrointestinal: no nausea, no vomiting, no diarrhea, no constipation Genitourinary: no urinary incontinence, no dysuria, no hematuria Musculoskeletal: no arthralgias, no myalgias Skin: no skin lesions, no pruritus, Neuro: + weakness, no loss of consciousness, no syncope Psych: no anxiety, no depression, + decrease appetite, + sleep difficulty Heme/Lymph: no bruising, no bleeding  ED Course: Discussed with EDP, patient requiring hospitalization for chief concerns of NSTEMI.  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 Darrold Junker NG gtt for chest pain initated on admission  Alcohol abuse Last ETOH drink was 2-3 days ago, patient is unsure CIWA precaution initiated on admission  Dyslipidemia Home atorvastatin 80  mg nightly resumed on admission  Insulin dependent type 2 diabetes mellitus (HCC) Insulin SSI with at bedtime coverage ordered Goal inpatient blood glucose levels 140-180  Insomnia Trazodone 100 mg nightly resumed  S/P angioplasty with stent Per patient approximately 15-16 years ago  Per cardiology, agree with heparin gtt. and nitroglycerin gtt.  No plans for procedures today. Chart reviewed.   DVT prophylaxis: Heparin GTT Code Status: DNR/DNI Diet: N.p.o. pending cardiology evaluation; heart healthy/carb modified diet ordered; n.p.o. after midnight Family Communication: Updated spouse [name] at bedside with patient's permission Disposition Plan: Pending clinical course Consults called: Cardiology Admission status: PCU, inpatient  Past Medical History:  Diagnosis Date   Acute ST elevation myocardial infarction (STEMI) of inferior wall (HCC) 04/19/2010   a.) [facility] --> LHC/PCI (very difficult procedure) --> 3.0 x 23 mm and 3.0 x 12 mm Xience stents to RCA   Allergies    Arthritis    Benign essential hypertension    Bilateral carotid artery disease (HCC) 05/08/2021   a.) carotid doppler 05/08/2021: 1-39% BICA   CAD (coronary artery disease) 04/19/2010   a.) inferior STEMI 04/19/2010 --> LHC/PCI: 50-70% pD1, 80% pRI, 90/90/90% RCA (overlapping 3.0 x 23 and 3.0 x 12 mm Xience DES); b.) MV 11/10/2018: fixed minimally reversible inferior perfusion defect   Cellulitis of foot    DDD (degenerative disc disease), cervical    Diabetes mellitus type 2, insulin dependent (HCC)    Diverticulosis    Full dentures    Gout    Hard of hearing    History of bilateral cataract extraction 2022   History of ETOH abuse    Hyperlipidemia    Ischemic cardiomyopathy 04/19/2010   a.) TTE 04/19/2010: 40%; b.) TTE 04/20/2014: EF >55%, mild RVE, triv PR, mild MR/TR, G1DD; c.) TTE 11/10/2018: EF 45%, inf HK, mild RVE, triv TR/PR, mild MR, G1DD; d.) TTE 11/14/2022: EF 25-30%, basal  anteroseptal, apical lateral, apical septal, and apex AK, mid anterolateral HK, mild-mod MR/TR   Long term current use of aspirin    Long term current use of clopidogrel    Lumbar degenerative disc disease    Lumbar radiculopathy    Lumbar vertebral fracture (chronic superior endplate of L1)    NSTEMI (non-ST elevated myocardial infarction) (HCC) 01/15/2023   OSA (obstructive sleep apnea)    a.) unable to tolerate nocturnal PAP therapy   Peripheral artery disease (HCC)    a.) stenting 05/14/21: 12 mm x 12 cm LifeStent RIGHT dis SFA/prox pop; b.) s/p cath directed thrombolysis RIGHT SFA/pop 06/14/21; c.) s/p mech thrombectomy + stenting 06/15/21: 8 mm x 25cm & 8 mm x 7.5cm Viabahn; d.) s/p BILAT CFA, profunda femoris, SFA endarterectomies + fogarty embolectomy + stenting 11/15/22: 12mm x 58mm Lifestream BILAT CIAs, 14 mm x 6 cm Lifestream & 13 mm x 5 cm Viabahn LEFT EIA   Peripheral neuropathy    Umbilical hernia    Past Surgical History:  Procedure Laterality Date   AMPUTATION Right 11/18/2022   Procedure: AMPUTATION 4TH AND 5TH RAY;  Surgeon: Linus Galas, [name];  Location: [facility];  Service: Orthopedics/Podiatry;  Laterality: Right;  4th and 5th toe   AMPUTATION Right 04/20/2023   Procedure: AMPUTATION BELOW KNEE;  Surgeon: [name],  Morton Peters, MD;  Location: [facility];  Service: Vascular;  Laterality: Right;  block then general   APPLICATION OF WOUND VAC Right 01/09/2023   Procedure: APPLICATION OF WOUND VAC;  Surgeon: Annice Needy, MD;  Location: [facility];  Service: Vascular;  Laterality: Right;   CATARACT EXTRACTION W/PHACO Right 03/14/2021   Procedure: CATARACT EXTRACTION PHACO AND INTRAOCULAR LENS PLACEMENT (IOC) RIGHT DIABETIC;  Surgeon: Lockie Mola, MD;  Location: [facility];  Service: Ophthalmology;  Laterality: Right;  Diabetic 16.78 01:39.9   CATARACT EXTRACTION W/PHACO Left 03/28/2021   Procedure: CATARACT EXTRACTION PHACO AND INTRAOCULAR LENS PLACEMENT (IOC) LEFT  DIABETIC 6.93 01:22.0;  Surgeon: Lockie Mola, MD;  Location: [facility];  Service: Ophthalmology;  Laterality: Left;  Diabetic   COLONOSCOPY     CORONARY ANGIOPLASTY WITH STENT PLACEMENT  03/2010   Procedure: CORONARY ANGIOPLASTY WITH STENT PLACEMENT; Location: [name]   ENDARTERECTOMY FEMORAL Bilateral 11/15/2022   Procedure: BILATERAL COMMON FEMORAL PROFUNDA FEMORIS AND SUPERFICIAL FEMORAL ARTERY ENDARTECTOMIES, RIGHT FOGARTY EMBOLECTOMY OF THE RIGHT SFA  AND  POPLITEAL ARTERIES. AORTAGRAM AND RIGHT LOWER EXTREMITY ANGIOGRAM.;  Surgeon: Annice Needy, MD;  Location: [facility];  Service: Vascular;  Laterality: Bilateral;   INSERTION OF ILIAC STENT Bilateral 11/15/2022   Procedure: BILATERAL STENT INSERTION IN BILATERAL  COMMON ILIAC ARTERY, STENT INSERTION OF LEFT EXTERNAL ILIAC ARTERY. ANGIOPLASTY RIGHT TIBIAL  AND POPLITEAL ARTERY.;  Surgeon: Annice Needy, MD;  Location: [facility];  Service: Vascular;  Laterality: Bilateral;   IRRIGATION AND DEBRIDEMENT KNEE Right 04/21/2023   Procedure: IRRIGATION AND DEBRIDEMENT KNEE;  Surgeon: Campbell Stall, MD;  Location: [facility];  Service: Orthopedics;  Laterality: Right;   LOWER EXTREMITY ANGIOGRAPHY Right 05/14/2021   Procedure: LOWER EXTREMITY ANGIOGRAPHY;  Surgeon: Annice Needy, MD;  Location: [facility];  Service: Cardiovascular;  Laterality: Right;   LOWER EXTREMITY ANGIOGRAPHY Right 06/14/2021   Procedure: Lower Extremity Angiography;  Surgeon: Annice Needy, MD;  Location: [facility];  Service: Cardiovascular;  Laterality: Right;   LOWER EXTREMITY ANGIOGRAPHY Right 11/11/2022   Procedure: Lower Extremity Angiography;  Surgeon: Annice Needy, MD;  Location: [facility];  Service: Cardiovascular;  Laterality: Right;   LOWER EXTREMITY INTERVENTION Right 06/15/2021   Procedure: LOWER EXTREMITY INTERVENTION;  Surgeon: Annice Needy, MD;  Location: [facility];  Service: Cardiovascular;  Laterality:  Right;   TONSILLECTOMY     TRANSMETATARSAL AMPUTATION Right 02/17/2023   Procedure: TRANSMETATARSAL AMPUTATION;  Surgeon: Rosetta Posner, [name];  Location: [facility];  Service: Orthopedics/Podiatry;  Laterality: Right;   WOUND DEBRIDEMENT Right 01/09/2023   Procedure: DEBRIDEMENT WOUND;  Surgeon: Annice Needy, MD;  Location: [facility];  Service: Vascular;  Laterality: Right;   Social History:  reports that he has never smoked. He has never used smokeless tobacco. He reports current alcohol use of about 21.0 standard drinks of alcohol per week. He reports that he does not use drugs.  No Known Allergies Family History  Problem Relation Age of Onset   Scoliosis Mother    Heart disease Father    Family history: Family history reviewed and not pertinent.  Prior to Admission medications   Medication Sig Start Date End Date Taking? Authorizing Provider  acetaminophen (TYLENOL) 325 MG tablet Take 2 tablets (650 mg total) by mouth every 6 (six) hours as needed for mild pain (pain score 1-3) (or Fever >/= 101). 05/08/23   Carollee Herter, DO  aspirin EC 81 MG tablet  Take 1 tablet (81 mg total) by mouth daily. 11/26/22   Delfino Lovett, MD  atorvastatin (LIPITOR) 80 MG tablet Take 1 tablet (80 mg total) by mouth daily. Patient taking differently: Take 80 mg by mouth at bedtime. 11/26/22   Delfino Lovett, MD  carvedilol (COREG) 25 MG tablet Take 25 mg by mouth 2 (two) times daily with a meal.    [provider]  clopidogrel (PLAVIX) 75 MG tablet TAKE 1 TABLET(75 MG) BY MOUTH DAILY 05/01/23   Georgiana Spinner, NP  DULoxetine (CYMBALTA) 20 MG capsule Take 1 capsule (20 mg total) by mouth 2 (two) times daily. 05/08/23 08/06/23  Carollee Herter, DO  Ensure Max Protein (ENSURE MAX PROTEIN) LIQD Take 330 mLs (11 oz total) by mouth 2 (two) times daily. Patient taking differently: Take 11 oz by mouth daily. 11/26/22   Delfino Lovett, MD  FARXIGA 10 MG TABS tablet Take 10 mg by mouth daily. 04/02/23   [provider]  ferrous  sulfate 325 (65 FE) MG EC tablet Take by mouth.    [provider]  folic acid (FOLVITE) 1 MG tablet Take 1 tablet (1 mg total) by mouth daily. 02/20/23   Arnetha Courser, MD  insulin aspart (NOVOLOG) 100 UNIT/ML injection Inject 0-20 Units into the skin 3 (three) times daily with meals. 05/08/23   Carollee Herter, DO  insulin aspart (NOVOLOG) 100 UNIT/ML injection Inject 0-5 Units into the skin at bedtime. 05/08/23   Carollee Herter, DO  insulin glargine-yfgn (SEMGLEE) 100 UNIT/ML injection Inject 0.17 mLs (17 Units total) into the skin daily. 05/09/23   Carollee Herter, DO  iron polysaccharides (NIFEREX) 150 MG capsule Take 1 capsule (150 mg total) by mouth daily. 01/20/23 04/20/23  Gillis Santa, MD  methocarbamol (ROBAXIN) 500 MG tablet Take by mouth.    [provider]  mirtazapine (REMERON) 15 MG tablet Take 1 tablet by mouth at bedtime. 05/27/23   [provider]  ondansetron (ZOFRAN) 4 MG tablet Take 1 tablet (4 mg total) by mouth every 6 (six) hours as needed for nausea. 05/08/23   Carollee Herter, DO  polyethylene glycol (MIRALAX / GLYCOLAX) 17 g packet Take 17 g by mouth daily as needed for mild constipation. 02/19/23   Arnetha Courser, MD  pregabalin (LYRICA) 75 MG capsule Take 1 capsule (75 mg total) by mouth 2 (two) times daily. 05/08/23 06/19/24  Carollee Herter, DO  sacubitril-valsartan (ENTRESTO) 24-26 MG Take 1 tablet by mouth every 12 (twelve) hours. 01/28/23 01/28/24  [provider]  senna-docusate (SENOKOT-S) 8.6-50 MG tablet Take 1 tablet by mouth at bedtime as needed for mild constipation. 06/17/21   Burnadette Pop, MD  spironolactone (ALDACTONE) 50 MG tablet Take 1 tablet (50 mg total) by mouth daily. 11/26/22 01/01/24  Delfino Lovett, MD  tamsulosin (FLOMAX) 0.4 MG CAPS capsule Take 1 capsule (0.4 mg total) by mouth daily after supper. 05/08/23 08/06/23  Carollee Herter, DO  thiamine (VITAMIN B-1) 100 MG tablet Take 1 tablet (100 mg total) by mouth daily. 02/20/23   Arnetha Courser, MD   traZODone (DESYREL) 100 MG tablet Take 1 tablet (100 mg total) by mouth at bedtime. 05/08/23   Carollee Herter, DO   Physical Exam: Vitals:   07/09/23 1146 07/09/23 1330 07/09/23 1441  BP: (!) 144/93    Pulse: 68  69  Resp: 18    Temp: 98.3 F (36.8 C)    TempSrc: Oral    SpO2: 100%    Weight:  95.7 kg   Height:  5' 7.99" (1.727 m)    Constitutional: appears age-appropriate, acutely uncomfortable Eyes: PERRL, lids and conjunctivae normal ENMT: Mucous membranes are dry. Posterior pharynx clear of any exudate or lesions. Age-appropriate dentition. Hearing appropriate Neck: normal, supple, no masses, no thyromegaly Respiratory: clear to auscultation bilaterally, no wheezing, no crackles. Normal respiratory effort. No accessory muscle use.  Cardiovascular: Regular rate and rhythm, no murmurs / rubs / gallops. 2+ pitting extremity edema of the left lower extremity. 2+ pedal pulses. No carotid bruits.  Abdomen: Obese abdomen, no tenderness, no masses palpated, no hepatosplenomegaly. Bowel sounds positive.  Musculoskeletal: no clubbing / cyanosis. No joint deformity upper and lower extremities. Good ROM, no contractures, no atrophy. Normal muscle tone.  Right BKA Skin: no rashes, lesions, ulcers. No induration Neurologic: Sensation intact. Strength 5/5 in all 4.  Psychiatric: Normal judgment and insight. Alert and oriented x 3.  Anxious mood.   EKG: independently reviewed, showing sinus rhythm with rate of 66, QTc 475  Chest x-ray on Admission: I personally reviewed and and appreciate low volume chest with cardiomegaly and trace bilateral pleural effusion with vascular congestion   No results found.  Labs on Admission: I have personally reviewed following labs  CBC: Recent Labs  Lab 07/09/23 1152  WBC 8.7  HGB 11.8*  HCT 38.0*  MCV 80.0  PLT 293   Basic Metabolic Panel: Recent Labs  Lab 07/09/23 1152  NA 132*  K 5.3*  CL 99  CO2 27  GLUCOSE 288*  BUN 21  CREATININE 1.01   CALCIUM 9.5   GFR: Estimated Creatinine Clearance: 66.5 mL/min (by C-G formula based on SCr of 1.01 mg/dL).  Liver Function Tests: Recent Labs  Lab 07/09/23 1152  AST 21  ALT 14  ALKPHOS 83  BILITOT 0.9  PROT 7.2  ALBUMIN 3.5   Recent Labs  Lab 07/09/23 1152  LIPASE 23   Urine analysis:    Component Value Date/Time   COLORURINE STRAW (A) 04/18/2023 1758   APPEARANCEUR CLEAR (A) 04/18/2023 1758   LABSPEC 1.005 04/18/2023 1758   PHURINE 5.0 04/18/2023 1758   GLUCOSEU >=500 (A) 04/18/2023 1758   HGBUR SMALL (A) 04/18/2023 1758   BILIRUBINUR NEGATIVE 04/18/2023 1758   KETONESUR NEGATIVE 04/18/2023 1758   PROTEINUR NEGATIVE 04/18/2023 1758   NITRITE NEGATIVE 04/18/2023 1758   LEUKOCYTESUR NEGATIVE 04/18/2023 1758   CRITICAL CARE Performed by: Dr. Sedalia Muta  Total critical care time: 32 minutes  Critical care time was exclusive of separately billable procedures and treating other patients.  Critical care was necessary to treat or prevent imminent or life-threatening deterioration.  Critical care was time spent personally by me on the following activities: development of treatment plan with patient and spouse as well as nursing, discussions with consultants, evaluation of patient's response to treatment, examination of patient, obtaining history from patient or surrogate, ordering and performing treatments and interventions, ordering and review of laboratory studies, ordering and review of radiographic studies, pulse oximetry and re-evaluation of patient's condition.  This document was prepared using Dragon Voice Recognition software and may include unintentional dictation errors.  Dr. Sedalia Muta [facility]  If 7PM-7AM, please contact overnight-coverage provider If 7AM-7PM, please contact day attending provider [email or web address]  07/09/2023, 2:42 PM

## 2023-07-09 NOTE — Assessment & Plan Note (Signed)
-   Trazodone 100 mg nightly resumed

## 2023-07-09 NOTE — Progress Notes (Signed)
 Heart Failure Navigator Progress Note  Assessed for Heart & Vascular TOC clinic readiness.  Patient does not meet criteria due to current [facility] patient.   Navigator will sign off at this time.  Roxy Horseman, RN, BSN [facility] Heart Failure Navigator Secure Chat Only

## 2023-07-09 NOTE — Assessment & Plan Note (Addendum)
 Continue heparin GTT and bolus per EDP Complete echo ordered on admission Cardiology has been consulted via secure chat and Epic order to Dr. Darrold Junker NG gtt for chest pain initated on admission

## 2023-07-09 NOTE — ED Triage Notes (Addendum)
 Pt to ED via POV from home. Pt reports multiple complaints. Pt reports PCP has not addressed problem of inability to sleep the last few months. Pt reports abd distention, SOB, decreased UO and decreased appetite. Pt denies CP. Pt is on blood thinner. Pt is right BKA  Pt wears 2L [place] chronically

## 2023-07-09 NOTE — ED Notes (Signed)
 SBAR report received from [employer]. Pt resting on stretcher with wife at bedside. Heparin and nitroglycerin infusing per MAR. Pt denies pain, denies needs at this time.

## 2023-07-09 NOTE — Progress Notes (Signed)
 PHARMACY - ANTICOAGULATION CONSULT NOTE  Pharmacy Consult for Heparin Infusion Indication: chest pain/ACS  No Known Allergies  Patient Measurements: Weight: 95.7 kg (211 lb) Heparin Dosing Weight: 88.5 kg  Vital Signs: Temp: 98.3 F (36.8 C) (03/19 1146) Temp Source: Oral (03/19 1146) BP: 144/93 (03/19 1146) Pulse Rate: 68 (03/19 1146)  Labs: Recent Labs    07/09/23 1152  HGB 11.8*  HCT 38.0*  PLT 293  CREATININE 1.01  TROPONINIHS 1,045*    Estimated Creatinine Clearance: 66.5 mL/min (by C-G formula based on SCr of 1.01 mg/dL).   Medical History: Past Medical History:  Diagnosis Date   Acute ST elevation myocardial infarction (STEMI) of inferior wall (HCC) 04/19/2010   a.) transfered from [facility] to [facility] --> LHC/PCI (very difficult procedure) --> 3.0 x 23 mm and 3.0 x 12 mm Xience stents to RCA   Allergies    Arthritis    Benign essential hypertension    Bilateral carotid artery disease (HCC) 05/08/2021   a.) carotid doppler 05/08/2021: 1-39% BICA   CAD (coronary artery disease) 04/19/2010   a.) inferior STEMI 04/19/2010 --> LHC/PCI: 50-70% pD1, 80% pRI, 90/90/90% RCA (overlapping 3.0 x 23 and 3.0 x 12 mm Xience DES); b.) MV 11/10/2018: fixed minimally reversible inferior perfusion defect   Cellulitis of foot    DDD (degenerative disc disease), cervical    Diabetes mellitus type 2, insulin dependent (HCC)    Diverticulosis    Full dentures    Gout    Hard of hearing    History of bilateral cataract extraction 2022   History of ETOH abuse    Hyperlipidemia    Ischemic cardiomyopathy 04/19/2010   a.) TTE 04/19/2010: 40%; b.) TTE 04/20/2014: EF >55%, mild RVE, triv PR, mild MR/TR, G1DD; c.) TTE 11/10/2018: EF 45%, inf HK, mild RVE, triv TR/PR, mild MR, G1DD; d.) TTE 11/14/2022: EF 25-30%, basal anteroseptal, apical lateral, apical septal, and apex AK, mid anterolateral HK, mild-mod MR/TR   Long term current use of aspirin    Long term current use of clopidogrel     Lumbar degenerative disc disease    Lumbar radiculopathy    Lumbar vertebral fracture (chronic superior endplate of L1)    NSTEMI (non-ST elevated myocardial infarction) (HCC) 01/15/2023   OSA (obstructive sleep apnea)    a.) unable to tolerate nocturnal PAP therapy   Peripheral artery disease (HCC)    a.) stenting 05/14/21: 12 mm x 12 cm LifeStent RIGHT dis SFA/prox pop; b.) s/p cath directed thrombolysis RIGHT SFA/pop 06/14/21; c.) s/p mech thrombectomy + stenting 06/15/21: 8 mm x 25cm & 8 mm x 7.5cm Viabahn; d.) s/p BILAT CFA, profunda femoris, SFA endarterectomies + fogarty embolectomy + stenting 11/15/22: 12mm x 58mm Lifestream BILAT CIAs, 14 mm x 6 cm Lifestream & 13 mm x 5 cm Viabahn LEFT EIA   Peripheral neuropathy    Umbilical hernia     Medications:  Not on anticoagulation at home per chart review; previously took apixaban but looks like it was stopped 11/2022  Assessment: Patient is a 78 year old presenting with a chief complaint of mid-sternal, non-radiating chest pain and pressure x 24 hours. Troponin elevated at 1045. Pharmacy has been consulted to initiate patient on a heparin infusion for ACS.   Baseline INR and aPTT ordered.  No signs/symptoms of bleeding noted in chart. Hgb 11.8. PLT 293.  Goal of Therapy:  Heparin level 0.3-0.7 units/ml Monitor platelets by anticoagulation protocol: Yes   Plan:  Give 4000 unit bolus x 1 Start  heparin infusion at a rate of 1050 units/hr Check heparin level in 8 hours Monitor CBC daily while on heparin  Merryl Hacker, PharmD Clinical Pharmacist  07/09/2023,1:46 PM

## 2023-07-10 ENCOUNTER — Other Ambulatory Visit: Payer: Self-pay

## 2023-07-10 ENCOUNTER — Inpatient Hospital Stay
Admit: 2023-07-10 | Discharge: 2023-07-10 | Disposition: A | Discharge status: Home / Self Care | Attending: Internal Medicine

## 2023-07-10 ENCOUNTER — Inpatient Hospital Stay

## 2023-07-10 DIAGNOSIS — Z7189 Other specified counseling: Secondary | ICD-10-CM | POA: Diagnosis not present

## 2023-07-10 DIAGNOSIS — I214 Non-ST elevation (NSTEMI) myocardial infarction: Secondary | ICD-10-CM | POA: Diagnosis not present

## 2023-07-10 DIAGNOSIS — I5023 Acute on chronic systolic (congestive) heart failure: Secondary | ICD-10-CM

## 2023-07-10 LAB — BASIC METABOLIC PANEL
Anion gap: 8 (ref 5–15)
BUN: 24 mg/dL — ABNORMAL HIGH (ref 8–23)
CO2: 28 mmol/L (ref 22–32)
Calcium: 9.3 mg/dL (ref 8.9–10.3)
Chloride: 98 mmol/L (ref 98–111)
Creatinine, Ser: 1.02 mg/dL (ref 0.61–1.24)
GFR, Estimated: >60 mL/min (ref >60)
Glucose, Bld: 170 mg/dL — ABNORMAL HIGH (ref 70–99)
Potassium: 4.5 mmol/L (ref 3.5–5.1)
Sodium: 134 mmol/L — ABNORMAL LOW (ref 135–145)

## 2023-07-10 LAB — CBC
HCT: 35 % — ABNORMAL LOW (ref 39.0–52.0)
HCT: 34.8 % — ABNORMAL LOW (ref 39.0–52.0)
Hemoglobin: 10.7 g/dL — ABNORMAL LOW (ref 13.0–17.0)
Hemoglobin: 11.1 g/dL — ABNORMAL LOW (ref 13.0–17.0)
MCH: 24.7 pg — ABNORMAL LOW (ref 26.0–34.0)
MCH: 25.4 pg — ABNORMAL LOW (ref 26.0–34.0)
MCHC: 30.6 g/dL (ref 30.0–36.0)
MCHC: 31.9 g/dL (ref 30.0–36.0)
MCV: 80.6 fL (ref 80.0–100.0)
MCV: 79.6 fL — ABNORMAL LOW (ref 80.0–100.0)
Platelets: 257 10*3/uL (ref 150–400)
Platelets: 277 10*3/uL (ref 150–400)
RBC: 4.34 MIL/uL (ref 4.22–5.81)
RBC: 4.37 MIL/uL (ref 4.22–5.81)
RDW: 16.1 % — ABNORMAL HIGH (ref 11.5–15.5)
RDW: 16.1 % — ABNORMAL HIGH (ref 11.5–15.5)
WBC: 7.9 10*3/uL (ref 4.0–10.5)
WBC: 9.2 10*3/uL (ref 4.0–10.5)
nRBC: 0 % (ref 0.0–0.2)
nRBC: 0 % (ref 0.0–0.2)

## 2023-07-10 LAB — APTT: aPTT: 117 s — ABNORMAL HIGH (ref 24–36)

## 2023-07-10 LAB — CBG MONITORING, ED
Glucose-Capillary: 173 mg/dL — ABNORMAL HIGH (ref 70–99)
Glucose-Capillary: 156 mg/dL — ABNORMAL HIGH (ref 70–99)
Glucose-Capillary: 153 mg/dL — ABNORMAL HIGH (ref 70–99)

## 2023-07-10 LAB — PROTIME-INR
INR: 1.4 — ABNORMAL HIGH (ref 0.8–1.2)
Prothrombin Time: 17.6 s — ABNORMAL HIGH (ref 11.4–15.2)

## 2023-07-10 LAB — GLUCOSE, CAPILLARY: Glucose-Capillary: 180 mg/dL — ABNORMAL HIGH (ref 70–99)

## 2023-07-10 LAB — HEPARIN LEVEL (UNFRACTIONATED)
Heparin Unfractionated: 0.35 [IU]/mL (ref 0.30–0.70)
Heparin Unfractionated: 0.19 [IU]/mL — ABNORMAL LOW (ref 0.30–0.70)
Heparin Unfractionated: 0.61 [IU]/mL (ref 0.30–0.70)

## 2023-07-10 LAB — HEMOGLOBIN A1C
Hgb A1c MFr Bld: 8 % — ABNORMAL HIGH (ref 4.8–5.6)
Mean Plasma Glucose: 182.9 mg/dL

## 2023-07-10 LAB — FIBRINOGEN: Fibrinogen: 213 mg/dL (ref 210–475)

## 2023-07-10 LAB — D-DIMER, QUANTITATIVE: D-Dimer, Quant: 0.6 ug{FEU}/mL — ABNORMAL HIGH (ref 0.00–0.50)

## 2023-07-10 MED ORDER — HEPARIN BOLUS VIA INFUSION
2700.0000 [IU] | Freq: Once | INTRAVENOUS | Status: AC
  Administered 2023-07-10: 2700 [IU] via INTRAVENOUS
  Filled 2023-07-10: qty 2700

## 2023-07-10 MED ORDER — CLOPIDOGREL BISULFATE 75 MG PO TABS
75.0000 mg | ORAL_TABLET | Freq: Every day | ORAL | Status: DC
  Administered 2023-07-10: 75 mg via ORAL
  Filled 2023-07-10: qty 1

## 2023-07-10 MED ORDER — ASPIRIN 81 MG PO TBEC
81.0000 mg | DELAYED_RELEASE_TABLET | Freq: Every day | ORAL | Status: DC
  Administered 2023-07-10: 81 mg via ORAL
  Filled 2023-07-10: qty 1

## 2023-07-10 MED ORDER — ALUM & MAG HYDROXIDE-SIMETH 200-200-20 MG/5ML PO SUSP
30.0000 mL | Freq: Once | ORAL | Status: AC
  Administered 2023-07-10: 30 mL via ORAL
  Filled 2023-07-10: qty 30

## 2023-07-10 MED ORDER — SACUBITRIL-VALSARTAN 24-26 MG PO TABS
1.0000 | ORAL_TABLET | Freq: Two times a day (BID) | ORAL | Status: DC
  Administered 2023-07-10 – 2023-07-16 (×12): 1 via ORAL
  Filled 2023-07-10 (×14): qty 1

## 2023-07-10 NOTE — ED Notes (Signed)
 Pt denies chest pain, will decrease nitroglycerin to 63mcg/min

## 2023-07-10 NOTE — Progress Notes (Signed)
*  PRELIMINARY RESULTS* Echocardiogram 2D Echocardiogram has been performed.  Manuel [name] 07/10/2023, 4:00 PM

## 2023-07-10 NOTE — Plan of Care (Signed)

## 2023-07-10 NOTE — Consult Note (Signed)
 Cardiology Consult    Patient ID: Manuel [name] MRN: [id number], DOB/AGE: 11-19-1945   Admit date: 07/09/2023 Date of Consult: 07/11/2023  Primary Physician: Gracelyn Nurse, MD Primary Cardiologist: [name] Requesting Provider: Wilfred Curtis, MD  Patient Profile   SALAR MOLDEN is a 78 y.o. 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  We've been asked to provide a second opinion at the request of Dr. Ashok Pall.  Past Medical History  Subjective  Past Medical History:  Diagnosis Date   Acute ST elevation myocardial infarction (STEMI) of inferior wall (HCC) 04/19/2010   a.) transfered from [facility] to [facility] --> LHC/PCI (very difficult procedure) --> 3.0 x 23 mm and 3.0 x 12 mm Xience stents to RCA   Allergies    Arthritis    Benign essential hypertension    Bilateral carotid artery disease (HCC) 05/08/2021   a.) carotid doppler 05/08/2021: 1-39% BICA   CAD (coronary artery disease) 04/19/2010   a.) inferior STEMI 04/19/2010 --> LHC/PCI: 50-70% pD1, 80% pRI, 90/90/90% RCA (overlapping 3.0 x 23 and 3.0 x 12 mm Xience DES); b.) MV 11/10/2018: fixed minimally reversible inferior perfusion defect   Cellulitis of foot    Chronic HFrEF (heart failure with reduced ejection fraction) (HCC)    DDD (degenerative disc disease), cervical    Diabetes mellitus type 2, insulin dependent (HCC)    Diverticulosis    Full dentures    Gout    Hard of hearing    History of bilateral cataract extraction 2022   History of ETOH abuse    Hyperlipidemia    Ischemic cardiomyopathy 04/19/2010   a.) TTE 04/19/2010: 40%; b.) TTE 04/20/2014: EF >55%; c.) TTE 11/10/2018: EF  45%; d.) TTE 11/14/2022: EF 25-30%; e. 06/2023 Echo: EF 25-30%, nl RV size/fxn. Mild MR. Mild-mod TR. Ao sclerosis w/o stenosis.   Long term current use of aspirin    Long term current use of clopidogrel    Lumbar degenerative disc disease    Lumbar radiculopathy    Lumbar vertebral fracture (chronic superior endplate of L1)    NSTEMI (non-ST elevated myocardial infarction) (HCC) 01/15/2023   OSA (obstructive sleep apnea)    a.) unable to tolerate nocturnal PAP therapy   Peripheral artery disease (HCC)    a.) stenting 05/14/21: 12 mm x 12 cm LifeStent RIGHT dis SFA/prox pop; b.) s/p cath directed thrombolysis RIGHT SFA/pop 06/14/21; c.) s/p mech thrombectomy + stenting 06/15/21: 8 mm x 25cm & 8 mm x 7.5cm Viabahn; d.) s/p BILAT CFA, profunda femoris, SFA endarterectomies + fogarty embolectomy + stenting 11/15/22: 12mm x 58mm Lifestream BILAT CIAs, 14 mm x 6 cm Lifestream & 13 mm x 5 cm Viabahn LEFT EIA   Peripheral neuropathy    Umbilical hernia     Past Surgical History:  Procedure Laterality Date   AMPUTATION Right 11/18/2022   Procedure: AMPUTATION 4TH AND 5TH RAY;  Surgeon: Linus Galas, [name];  Location: [facility];  Service: Orthopedics/Podiatry;  Laterality: Right;  4th and 5th toe   AMPUTATION Right 04/20/2023   Procedure: AMPUTATION BELOW KNEE;  Surgeon: Bertram Denver, MD;  Location: [facility];  Service: Vascular;  Laterality: Right;  block then general  APPLICATION OF WOUND VAC Right 01/09/2023   Procedure: APPLICATION OF WOUND VAC;  Surgeon: Annice Needy, MD;  Location: [facility];  Service: Vascular;  Laterality: Right;   CATARACT EXTRACTION W/PHACO Right 03/14/2021   Procedure: CATARACT EXTRACTION PHACO AND INTRAOCULAR LENS PLACEMENT (IOC) RIGHT DIABETIC;  Surgeon: Lockie Mola, MD;  Location: [facility];  Service: Ophthalmology;  Laterality: Right;  Diabetic 16.78 01:39.9   CATARACT EXTRACTION W/PHACO Left 03/28/2021   Procedure: CATARACT EXTRACTION PHACO AND  INTRAOCULAR LENS PLACEMENT (IOC) LEFT DIABETIC 6.93 01:22.0;  Surgeon: Lockie Mola, MD;  Location: [facility];  Service: Ophthalmology;  Laterality: Left;  Diabetic   COLONOSCOPY     CORONARY ANGIOPLASTY WITH STENT PLACEMENT  03/2010   Procedure: CORONARY ANGIOPLASTY WITH STENT PLACEMENT; Location: [name]   ENDARTERECTOMY FEMORAL Bilateral 11/15/2022   Procedure: BILATERAL COMMON FEMORAL PROFUNDA FEMORIS AND SUPERFICIAL FEMORAL ARTERY ENDARTECTOMIES, RIGHT FOGARTY EMBOLECTOMY OF THE RIGHT SFA  AND  POPLITEAL ARTERIES. AORTAGRAM AND RIGHT LOWER EXTREMITY ANGIOGRAM.;  Surgeon: Annice Needy, MD;  Location: [facility];  Service: Vascular;  Laterality: Bilateral;   INSERTION OF ILIAC STENT Bilateral 11/15/2022   Procedure: BILATERAL STENT INSERTION IN BILATERAL  COMMON ILIAC ARTERY, STENT INSERTION OF LEFT EXTERNAL ILIAC ARTERY. ANGIOPLASTY RIGHT TIBIAL  AND POPLITEAL ARTERY.;  Surgeon: Annice Needy, MD;  Location: [facility];  Service: Vascular;  Laterality: Bilateral;   IRRIGATION AND DEBRIDEMENT KNEE Right 04/21/2023   Procedure: IRRIGATION AND DEBRIDEMENT KNEE;  Surgeon: Campbell Stall, MD;  Location: [facility];  Service: Orthopedics;  Laterality: Right;   LOWER EXTREMITY ANGIOGRAPHY Right 05/14/2021   Procedure: LOWER EXTREMITY ANGIOGRAPHY;  Surgeon: Annice Needy, MD;  Location: [facility];  Service: Cardiovascular;  Laterality: Right;   LOWER EXTREMITY ANGIOGRAPHY Right 06/14/2021   Procedure: Lower Extremity Angiography;  Surgeon: Annice Needy, MD;  Location: [facility];  Service: Cardiovascular;  Laterality: Right;   LOWER EXTREMITY ANGIOGRAPHY Right 11/11/2022   Procedure: Lower Extremity Angiography;  Surgeon: Annice Needy, MD;  Location: [facility];  Service: Cardiovascular;  Laterality: Right;   LOWER EXTREMITY INTERVENTION Right 06/15/2021   Procedure: LOWER EXTREMITY INTERVENTION;  Surgeon: Annice Needy, MD;  Location: [facility];   Service: Cardiovascular;  Laterality: Right;   TONSILLECTOMY     TRANSMETATARSAL AMPUTATION Right 02/17/2023   Procedure: TRANSMETATARSAL AMPUTATION;  Surgeon: Rosetta Posner, [name];  Location: [facility];  Service: Orthopedics/Podiatry;  Laterality: Right;   WOUND DEBRIDEMENT Right 01/09/2023   Procedure: DEBRIDEMENT WOUND;  Surgeon: Annice Needy, MD;  Location: [facility];  Service: Vascular;  Laterality: Right;     Allergies  No Known Allergies    History of Present Illness   78 y.o. 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  As noted, he was dx w/ CAD in 2011, at which time the mid RCA required 3 DES.  He has been followed by [facility] and [name] in the setting of progressive worsening of LV dysfxn over the past several years.  In 10/2018, echo showed reduction in EF ot 40-45%.  This was followed by stress testing, which showed inferior infarct and minimal peri-infarct ischemia.  He was medically managed.  Mr. Rudd has required multiple lower extremity interventions dating back to  05/2021 w/ subsequent PTA/stenting, and toe amputations in 10/2022, in the setting of critical limb ischemia.  Hospitalization was complicated by development of dyspnea and HF.  Echo was performed and showed a drop in EF to 25 to 30%.  He was seen by cardiology at that time with recommendation for ongoing GDMT and diuresis and otherwise conservative mgmt.  He required readmission for orthopnea/volume overload and demand ischemia in 12/2022, and was again conservatively managed.  He f/u in advanced [facility] clinic @ [facility] in 01/2023, w/ rec to cont GDMT.  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  Post-op course was complicated  by R knee septic/gouty arthritis req I & D.  He had a prolonged hospitalization and was finally d/c'd to rehab in mid-January 2025.  He states his quality of life has been poor such that he feels like he is ready to shift to comfort care. Recently he states that he has been unable to be independent and finds himself on his chair at home all day. He used to have better energy. He acknowledges liking to be active and does not feel like he has a good quality of life. He does believe that this could change when he gets his prosthetic which will be within the next 6 weeks.  On 07/09/22, he presented with shortness of breath. He reports to have been experiencing shortness of breath over for the past year but symptoms have gotten worse over the past week. The SOB is worse when he lays down at night. Additionally, the patient reports progressive swelling in his left lower extremity and abdominal distention. The swelling began after his right below-the-knee amputation on 04/20/23 and has gradually worsened since then. He also notes experiencing chest pressure and discomfort when lying flat at night. The chest pain is centrally located and occasionally radiates down into his abdomen. ED labs notable for creatinine 1.01, K 5.3, Sodium 132, Hemoglobin 11.8, WBC 8.7.  Troponin checked and trended 1045 944  895.  Chest x-ray with pulmonary vascular congestion.   Echo 3/20 EF 20-25%.  He has been seen by his primary cardiology team and pt has requested a second opinion.  Currently c/p free.  Inpatient Medications  Subjective    atorvastatin  80 mg Oral QHS   clopidogrel  75 mg Oral Daily   enoxaparin (LOVENOX) injection  45 mg Subcutaneous Q24H   feeding supplement  237 mL Oral BID BM   folic acid  1 mg Oral Daily   furosemide  60 mg Intravenous BID   [START ON 07/12/2023] influenza vaccine adjuvanted  0.5 mL Intramuscular Tomorrow-1000   insulin aspart  0-5 Units Subcutaneous QHS   insulin aspart  0-9 Units  Subcutaneous TID WC   insulin aspart protamine- aspart  15 Units Subcutaneous BID WC   multivitamin with minerals  1 tablet Oral Daily   sacubitril-valsartan  1 tablet Oral Q12H   thiamine  100 mg Oral Daily   Or   thiamine  100 mg Intravenous Daily   traZODone  100 mg Oral QHS    Family History    Family History  Problem Relation Age of Onset   Scoliosis Mother    Heart disease Father    He indicated that his mother is deceased. He indicated that his father is deceased. He indicated that his sister is alive. He indicated that both of his brothers are alive.   Social History    Social History  Socioeconomic History   Marital status: Married    Spouse name: [name]   Number of children: 3   Years of education: Not on file   Highest education level: Not on file  Occupational History   Not on file  Tobacco Use   Smoking status: Never   Smokeless tobacco: Never  Vaping Use   Vaping status: Never Used  Substance and Sexual Activity   Alcohol use: Yes    Alcohol/week: 21.0 standard drinks of alcohol    Types: 7 Glasses of wine, 14 Cans of beer per week    Comment: occassional   Drug use: No   Sexual activity: Not on file  Other Topics Concern   Not on file  Social History Narrative   Not on file   Social Drivers of Health   Financial Resource Strain: Low Risk  (07/11/2023)   Overall Financial Resource Strain (CARDIA)    Difficulty of Paying Living Expenses: Not hard at all  Food Insecurity: No Food Insecurity (07/10/2023)   Hunger Vital Sign    Worried About Running Out of Food in the Last Year: Never true    Ran Out of Food in the Last Year: Never true  Transportation Needs: No Transportation Needs (07/11/2023)   PRAPARE - Administrator, Civil Service (Medical): No    Lack of Transportation (Non-Medical): No  Physical Activity: Not on file  Stress: Not on file  Social Connections: Unknown (07/10/2023)   Social Connection and Isolation Panel [NHANES]     Frequency of Communication with Friends and Family: More than three times a week    Frequency of Social Gatherings with Friends and Family: More than three times a week    Attends Religious Services: Patient declined    Database administrator or Organizations: No    Attends Banker Meetings: Patient declined    Marital Status: Married  Catering manager Violence: Not At Risk (07/10/2023)   Humiliation, Afraid, Rape, and Kick questionnaire    Fear of Current or Ex-Partner: No    Emotionally Abused: No    Physically Abused: No    Sexually Abused: No     Review of Systems    General:  No chills, fever, night sweats or weight changes.  Cardiovascular:  +++ chest pain, +++ dyspnea on exertion. +++ orthopnea, +++ edema on LLE. No palpitations, paroxysmal nocturnal dyspnea. Dermatological: No rash, lesions/masses Respiratory: No cough, +++ dyspnea Urologic: No hematuria, dysuria Abdominal:   No nausea, vomiting, diarrhea, bright red blood per rectum, melena, or hematemesis Neurologic:  No visual changes, wkns, changes in mental status. All other systems reviewed and are otherwise negative except as noted above.    Objective  Physical Exam    Blood pressure 138/82, pulse 62, temperature 98 F (36.7 C), temperature source Oral, resp. rate 20, height 5' 7.99" (1.727 m), weight 95.7 kg, SpO2 98%.  General: Pleasant, NAD Psych: Normal affect. Neuro: Alert and oriented X 3. Moves all extremities spontaneously. HEENT: Normal  Neck: Supple without bruits or JVD. Lungs:  Resp regular and unlabored, CTA. Heart: RRR no s3, s4, or murmurs. Abdomen: Non-tender, slightly distended, BS + x 4.  Extremities: No clubbing, cyanosis or edema. Radials 2+ and equal bilaterally. R BKA noted.  Labs    Cardiac Enzymes Recent Labs  Lab 07/09/23 1152 07/09/23 1339 07/09/23 1615  TROPONINIHS 1,045* 944* 895*     BNP    Component Value Date/Time   BNP 2,395.3 (H) 07/09/2023 1152  Lab Results  Component Value Date   WBC 6.5 07/11/2023   HGB 10.6 (L) 07/11/2023   HCT 33.3 (L) 07/11/2023   MCV 78.0 (L) 07/11/2023   PLT 246 07/11/2023    Recent Labs  Lab 07/09/23 1152 07/10/23 0452 07/11/23 0407  NA 132*   < > 132*  K 5.3*   < > 4.1  CL 99   < > 96*  CO2 27   < > 25  BUN 21   < > 24*  CREATININE 1.01   < > 0.96  CALCIUM 9.5   < > 9.1  PROT 7.2  --   --   BILITOT 0.9  --   --   ALKPHOS 83  --   --   ALT 14  --   --   AST 21  --   --   GLUCOSE 288*   < > 200*   < > = values in this interval not displayed.     Radiology Studies    US ARTERIAL ABI (SCREENING LOWER EXTREMITY) Result Date: 07/10/2023 CLINICAL DATA:  Peripheral arterial disease EXAM: NONINVASIVE PHYSIOLOGIC VASCULAR STUDY OF BILATERAL LOWER EXTREMITIES TECHNIQUE: Evaluation of both lower extremities were performed at rest, including calculation of ankle-brachial indices with single level Doppler, pressure and pulse volume recording. COMPARISON:  None available FINDINGS: Right ABI:  Not obtained, previous   below-knee amputation. Left ABI: 0.95 Right Lower Extremity:  Post below-knee amputation Left Lower Extremity:  Multiphasic arterial waveforms at the ankle. IMPRESSION: 1. Borderline left lower extremity arterial occlusive disease at rest. 2. Post right below-knee amputation. Electronically Signed   By: Corlis Leak M.D.   On: 07/10/2023 17:33   DG Chest 2 View Result Date: 07/09/2023 CLINICAL DATA:  Shortness of breath. EXAM: CHEST - 2 VIEW COMPARISON:  01/15/2023. FINDINGS: Low lung volume. Mild diffuse vascular congestion. There is subtle blunting of bilateral costophrenic angles, suggesting bilateral trace pleural effusions. Bilateral lung fields are clear. Bilateral costophrenic angles are clear. Stable cardio-mediastinal silhouette. No acute osseous abnormalities. The soft tissues are within normal limits. IMPRESSION: Mild diffuse vascular congestion and bilateral trace pleural effusions,  concerning for congestive heart failure/pulmonary edema. Number Correlate clinically. Electronically Signed   By: Jules Schick M.D.   On: 07/09/2023 15:10      ECG & Cardiac Imaging    NSR, 66, ? Prior inf infarct, poor R progression/prior anterior infarct (nl R progression on subsequent ECGs this admission) - personally reviewed.  Assessment & Plan    1. Acute on Chronic HFrEF/ICM: Mr Grays presents to the ED with complaints of chest pain and worsening shortness of breath. He has had the SOB over the past year but it has progressively gotten worse. CXR showed vascular congestion with BNP 2395. Echo this admission w/ EF of 20 to 25%, similar to what was seen on echo in July of 2024 (down from 40-45% in 2020).  He has not had ischemic eval since nuc study in 2020, which showed inferior infarct w/ peri-infarct ischemia.  He has been responding well to IV lasix, though I/O inaccurate.  He remains volume overloaded on exam.  Continue Lasix 60mg  BID and entresto.  Pt also on carvedilol and spiro in outpt setting - currently on hold.  He has been hypertensive throughout admission with HRs in the 60's to 70's.  Resume carvedilol.  Will cont to hold spiro given hyperkalemia on admission.  Continue to monitor renal function. Continue to monitor electrolytes goal: K>4 and Mg>2.  2. NSTEMI/CAD: S/p PCI/DES x 2 to the RCA in 2011.  He had residual D1 and Ramus dzs at that time.  MV in 2020 w/ inferior infarct/peri-inf ischemia.  As above, Mr Larusso presented to the ED with complaints of chest pain and worsening shortness of breath for the past week. The chest pain was more prominent at night time but was constant in nature. Troponin 1045    944. Started on Heparin in the ED and was held earlier due to oozing from IV sites.  Given patients symptoms and reduced EF patient would be a candidate for a heart catheterization. Dicussed this with the patient today. Mr Strout seems hesitant and states his quality of  life right now isn't the best and at this time is not wanting to pursue intervention. He wants time to think it over. Cont statin and plavix.  Resume ? blocker.  If pressures remains stable/elevated, add long acting nitrate.  3. HTN Pressures elevated.  Resume ? blocker.  Cont entresto and diuretic rx.  4.  HL No lipids since 2023 - will f/u. Cont statin.  5.  DMII  Per IM.  6.  PAD  S/P multiple interventions in the past and R BKA in 12/26.24.  Continue home statin and plavix.   7. Insomnia  He states that this hospital admission was brought on d/t his inability to sleep. He admits to taking naps during the day and not being able to sleep at night. He currently is on trazodone and mirtazapine. Pending IM recommendations.  8. OSA  Part of his insomnia could be related to his OSA. He says he does not have a current OSA machine at home. Has a sleep study scheduled for April 3rd.   9. Microcytic Anemia Relatively stable.  10.  End of Life Care He states that his heath has declined over the past 1-2 years. Palliative was consulted. Pending patient's health decisions to see if he wants to become hospice.   Risk Assessment/Risk Scores:     TIMI Risk Score for Unstable Angina or Non-ST Elevation MI:   The patient's TIMI risk score is 6, which indicates a 41% risk of all cause mortality, new or recurrent myocardial infarction or need for urgent revascularization in the next 14 days.  New York Heart Association (NYHA) Functional Class NYHA Class IV    Signed, Nicolasa Ducking, NP 07/11/2023, 3:35 PM  For questions or updates, please contact   Please consult [email or web address] for contact info under Cardiology/STEMI.

## 2023-07-10 NOTE — ED Notes (Signed)
 Changed pressure dressing again which was saturated.  Monitoring ongoing.

## 2023-07-10 NOTE — Progress Notes (Signed)
 PHARMACY - ANTICOAGULATION CONSULT NOTE  Pharmacy Consult for Heparin Infusion Indication: chest pain/ACS  No Known Allergies  Patient Measurements: Height: 5' 7.99" (172.7 cm) Weight: 95.7 kg (211 lb) IBW/kg (Calculated) : 68.38 Heparin Dosing Weight: 88.5 kg  Vital Signs: Temp: 97.6 F (36.4 C) (03/20 1243) Temp Source: Oral (03/20 1243) BP: 151/85 (03/20 1300) Pulse Rate: 66 (03/20 1300)  Labs: Recent Labs    07/09/23 1152 07/09/23 1339 07/09/23 1407 07/09/23 1615 07/09/23 2038 07/10/23 0452 07/10/23 1247  HGB 11.8*  --   --   --   --  10.7*  --   HCT 38.0*  --   --   --   --  35.0*  --   PLT 293  --   --   --   --  257  --   APTT  --   --  29  --  46*  --   --   LABPROT  --   --  17.4*  --   --   --   --   INR  --   --  1.4*  --   --   --   --   HEPARINUNFRC  --   --   --   --  0.15* 0.35 0.19*  CREATININE 1.01  --   --   --   --  1.02  --   TROPONINIHS 1,045* 944*  --  895*  --   --   --     Estimated Creatinine Clearance: 65.9 mL/min (by C-G formula based on SCr of 1.02 mg/dL).   Medical History: Past Medical History:  Diagnosis Date   Acute ST elevation myocardial infarction (STEMI) of inferior wall (HCC) 04/19/2010   a.) trans[facility] to [facility] --> LHC/PCI (very difficult procedure) --> 3.0 x 23 mm and 3.0 x 12 mm Xience stents to RCA   Allergies    Arthritis    Benign essential hypertension    Bilateral carotid artery disease (HCC) 05/08/2021   a.) carotid doppler 05/08/2021: 1-39% BICA   CAD (coronary artery disease) 04/19/2010   a.) inferior STEMI 04/19/2010 --> LHC/PCI: 50-70% pD1, 80% pRI, 90/90/90% RCA (overlapping 3.0 x 23 and 3.0 x 12 mm Xience DES); b.) MV 11/10/2018: fixed minimally reversible inferior perfusion defect   Cellulitis of foot    DDD (degenerative disc disease), cervical    Diabetes mellitus type 2, insulin dependent (HCC)    Diverticulosis    Full dentures    Gout    Hard of hearing    History of bilateral cataract  extraction 2022   History of ETOH abuse    Hyperlipidemia    Ischemic cardiomyopathy 04/19/2010   a.) TTE 04/19/2010: 40%; b.) TTE 04/20/2014: EF >55%, mild RVE, triv PR, mild MR/TR, G1DD; c.) TTE 11/10/2018: EF 45%, inf HK, mild RVE, triv TR/PR, mild MR, G1DD; d.) TTE 11/14/2022: EF 25-30%, basal anteroseptal, apical lateral, apical septal, and apex AK, mid anterolateral HK, mild-mod MR/TR   Long term current use of aspirin    Long term current use of clopidogrel    Lumbar degenerative disc disease    Lumbar radiculopathy    Lumbar vertebral fracture (chronic superior endplate of L1)    NSTEMI (non-ST elevated myocardial infarction) (HCC) 01/15/2023   OSA (obstructive sleep apnea)    a.) unable to tolerate nocturnal PAP therapy   Peripheral artery disease (HCC)    a.) stenting 05/14/21: 12 mm x 12 cm LifeStent RIGHT dis SFA/prox  pop; b.) s/p cath directed thrombolysis RIGHT SFA/pop 06/14/21; c.) s/p mech thrombectomy + stenting 06/15/21: 8 mm x 25cm & 8 mm x 7.5cm Viabahn; d.) s/p BILAT CFA, profunda femoris, SFA endarterectomies + fogarty embolectomy + stenting 11/15/22: 12mm x 58mm Lifestream BILAT CIAs, 14 mm x 6 cm Lifestream & 13 mm x 5 cm Viabahn LEFT EIA   Peripheral neuropathy    Umbilical hernia     Medications:  Not on anticoagulation at home per chart review; previously took apixaban but looks like it was stopped 11/2022  Assessment: Patient is a 78 year old presenting with a chief complaint of mid-sternal, non-radiating chest pain and pressure x 24 hours. Troponin elevated at 1045. Pharmacy has been consulted to initiate patient on a heparin infusion for ACS.   Baseline INR and aPTT ordered.  No signs/symptoms of bleeding noted in chart. Hgb 11.8. PLT 293.  Goal of Therapy:  Heparin level 0.3-0.7 units/ml Monitor platelets by anticoagulation protocol: Yes  03/19 2038 HL 0.15, subtherapeutic 03/20 0452 HL 0.35, therapeutic x 1 03/20 1247 HL 0.19, subtherapeutic    Plan:   Heparin level subtherapeutic  Give 2700 unit bolus x 1 Increase heparin infusion rate to 1550 units/hr Recheck HL in 8 hr  Monitor CBC daily while on heparin  Paulita Fujita, PharmD Clinical Pharmacist  07/10/2023 1:25 PM

## 2023-07-10 NOTE — Progress Notes (Signed)
 PHARMACY - ANTICOAGULATION CONSULT NOTE  Pharmacy Consult for Heparin Infusion Indication: chest pain/ACS  No Known Allergies  Patient Measurements: Height: 5' 7.99" (172.7 cm) Weight: 95.7 kg (211 lb) IBW/kg (Calculated) : 68.38 Heparin Dosing Weight: 88.5 kg  Vital Signs: Temp: 97.8 F (36.6 C) (03/20 0140) Temp Source: Oral (03/20 0140) BP: 156/86 (03/20 0526) Pulse Rate: 68 (03/20 0526)  Labs: Recent Labs    07/09/23 1152 07/09/23 1339 07/09/23 1407 07/09/23 1615 07/09/23 2038 07/10/23 0452  HGB 11.8*  --   --   --   --  10.7*  HCT 38.0*  --   --   --   --  35.0*  PLT 293  --   --   --   --  257  APTT  --   --  29  --  46*  --   LABPROT  --   --  17.4*  --   --   --   INR  --   --  1.4*  --   --   --   HEPARINUNFRC  --   --   --   --  0.15* 0.35  CREATININE 1.01  --   --   --   --  1.02  TROPONINIHS 1,045* 944*  --  895*  --   --     Estimated Creatinine Clearance: 65.9 mL/min (by C-G formula based on SCr of 1.02 mg/dL).   Medical History: Past Medical History:  Diagnosis Date   Acute ST elevation myocardial infarction (STEMI) of inferior wall (HCC) 04/19/2010   a.) transfered from [facility] to [facility] --> LHC/PCI (very difficult procedure) --> 3.0 x 23 mm and 3.0 x 12 mm Xience stents to RCA   Allergies    Arthritis    Benign essential hypertension    Bilateral carotid artery disease (HCC) 05/08/2021   a.) carotid doppler 05/08/2021: 1-39% BICA   CAD (coronary artery disease) 04/19/2010   a.) inferior STEMI 04/19/2010 --> LHC/PCI: 50-70% pD1, 80% pRI, 90/90/90% RCA (overlapping 3.0 x 23 and 3.0 x 12 mm Xience DES); b.) MV 11/10/2018: fixed minimally reversible inferior perfusion defect   Cellulitis of foot    DDD (degenerative disc disease), cervical    Diabetes mellitus type 2, insulin dependent (HCC)    Diverticulosis    Full dentures    Gout    Hard of hearing    History of bilateral cataract extraction 2022   History of ETOH abuse    Hyperlipidemia     Ischemic cardiomyopathy 04/19/2010   a.) TTE 04/19/2010: 40%; b.) TTE 04/20/2014: EF >55%, mild RVE, triv PR, mild MR/TR, G1DD; c.) TTE 11/10/2018: EF 45%, inf HK, mild RVE, triv TR/PR, mild MR, G1DD; d.) TTE 11/14/2022: EF 25-30%, basal anteroseptal, apical lateral, apical septal, and apex AK, mid anterolateral HK, mild-mod MR/TR   Long term current use of aspirin    Long term current use of clopidogrel    Lumbar degenerative disc disease    Lumbar radiculopathy    Lumbar vertebral fracture (chronic superior endplate of L1)    NSTEMI (non-ST elevated myocardial infarction) (HCC) 01/15/2023   OSA (obstructive sleep apnea)    a.) unable to tolerate nocturnal PAP therapy   Peripheral artery disease (HCC)    a.) stenting 05/14/21: 12 mm x 12 cm LifeStent RIGHT dis SFA/prox pop; b.) s/p cath directed thrombolysis RIGHT SFA/pop 06/14/21; c.) s/p mech thrombectomy + stenting 06/15/21: 8 mm x 25cm & 8 mm x 7.5cm Viabahn; d.)  s/p BILAT CFA, profunda femoris, SFA endarterectomies + fogarty embolectomy + stenting 11/15/22: 12mm x 58mm Lifestream BILAT CIAs, 14 mm x 6 cm Lifestream & 13 mm x 5 cm Viabahn LEFT EIA   Peripheral neuropathy    Umbilical hernia     Medications:  Not on anticoagulation at home per chart review; previously took apixaban but looks like it was stopped 11/2022  Assessment: Patient is a 78 year old presenting with a chief complaint of mid-sternal, non-radiating chest pain and pressure x 24 hours. Troponin elevated at 1045. Pharmacy has been consulted to initiate patient on a heparin infusion for ACS.   Baseline INR and aPTT ordered.  No signs/symptoms of bleeding noted in chart. Hgb 11.8. PLT 293.  Goal of Therapy:  Heparin level 0.3-0.7 units/ml Monitor platelets by anticoagulation protocol: Yes  03/19 2038 HL 0.15, subtherapeutic 03/20 0452 HL 0.35, therapeutic x 1   Plan:  Continue heparin infusion rate at 1300 units/hr Recheck HL in 8 hr to confirm Monitor CBC daily  while on heparin  Otelia Sergeant, PharmD, [facility] 07/10/2023 5:40 AM

## 2023-07-10 NOTE — Progress Notes (Signed)
 [facility] CLINIC CARDIOLOGY PROGRESS NOTE       Patient ID: Manuel [name] MRN: [id number] DOB/AGE: 1945-06-29 77 y.o.  Admit date: 07/09/2023 Referring Physician Dr. Londell Moh Primary Physician Gracelyn Nurse, MD  Primary Cardiologist Dr. Lorelle Gibbs, Dr. Allena Katz Reason for Consultation NSTEMI, SOB  HPI: Manuel [name] is a 78 y.o. 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 Troponins elevated. Cardiology was consulted for further evaluation.   Interval history: -Patient seen and examined this morning, reports feeling overall better today. -Denies any chest pain.  States that shortness of breath is mildly improved. -He feels his abdominal distention is slightly improved as well.  Still with lower extremity edema. -Endorses good UOP overnight with IV Lasix.  Renal function stable.  Review of systems complete and found to be negative unless listed above    Past Medical History:  Diagnosis Date   Acute ST elevation myocardial infarction (STEMI) of inferior wall (HCC) 04/19/2010   a.) transfered from [facility] to [facility] --> LHC/PCI (very difficult procedure) --> 3.0 x 23 mm and 3.0 x 12 mm Xience stents to RCA   Allergies    Arthritis    Benign essential hypertension    Bilateral carotid artery disease (HCC) 05/08/2021   a.) carotid doppler 05/08/2021: 1-39% BICA   CAD (coronary artery disease) 04/19/2010   a.) inferior STEMI 04/19/2010 --> LHC/PCI: 50-70% pD1, 80% pRI, 90/90/90% RCA (overlapping 3.0 x 23 and 3.0 x 12 mm Xience DES); b.) MV 11/10/2018: fixed minimally reversible inferior perfusion defect   Cellulitis of foot    DDD (degenerative disc disease), cervical    Diabetes mellitus  type 2, insulin dependent (HCC)    Diverticulosis    Full dentures    Gout    Hard of hearing    History of bilateral cataract extraction 2022   History of ETOH abuse    Hyperlipidemia    Ischemic cardiomyopathy 04/19/2010   a.) TTE 04/19/2010: 40%; b.) TTE 04/20/2014: EF >55%, mild RVE, triv PR, mild MR/TR, G1DD; c.) TTE 11/10/2018: EF 45%, inf HK, mild RVE, triv TR/PR, mild MR, G1DD; d.) TTE 11/14/2022: EF 25-30%, basal anteroseptal, apical lateral, apical septal, and apex AK, mid anterolateral HK, mild-mod MR/TR   Long term current use of aspirin    Long term current use of clopidogrel    Lumbar degenerative disc disease    Lumbar radiculopathy    Lumbar vertebral fracture (chronic superior endplate of L1)    NSTEMI (non-ST elevated myocardial infarction) (HCC) 01/15/2023   OSA (obstructive sleep apnea)    a.) unable to tolerate nocturnal PAP therapy   Peripheral artery disease (HCC)    a.) stenting 05/14/21: 12 mm x 12 cm LifeStent RIGHT dis SFA/prox pop; b.) s/p cath directed thrombolysis RIGHT SFA/pop 06/14/21; c.) s/p mech thrombectomy + stenting 06/15/21: 8 mm x 25cm & 8 mm x 7.5cm Viabahn; d.) s/p BILAT CFA, profunda femoris, SFA endarterectomies + fogarty embolectomy + stenting 11/15/22: 12mm x 58mm Lifestream BILAT CIAs, 14 mm x 6 cm Lifestream & 13 mm x 5 cm Viabahn LEFT EIA   Peripheral neuropathy    Umbilical hernia     Past Surgical History:  Procedure Laterality  Date   AMPUTATION Right 11/18/2022   Procedure: AMPUTATION 4TH AND 5TH RAY;  Surgeon: Linus Galas, [name];  Location: [facility];  Service: Orthopedics/Podiatry;  Laterality: Right;  4th and 5th toe   AMPUTATION Right 04/20/2023   Procedure: AMPUTATION BELOW KNEE;  Surgeon: Bertram Denver, MD;  Location: [facility];  Service: Vascular;  Laterality: Right;  block then general   APPLICATION OF WOUND VAC Right 01/09/2023   Procedure: APPLICATION OF WOUND VAC;  Surgeon: Annice Needy, MD;  Location: [facility];  Service: Vascular;   Laterality: Right;   CATARACT EXTRACTION W/PHACO Right 03/14/2021   Procedure: CATARACT EXTRACTION PHACO AND INTRAOCULAR LENS PLACEMENT (IOC) RIGHT DIABETIC;  Surgeon: Lockie Mola, MD;  Location: [facility];  Service: Ophthalmology;  Laterality: Right;  Diabetic 16.78 01:39.9   CATARACT EXTRACTION W/PHACO Left 03/28/2021   Procedure: CATARACT EXTRACTION PHACO AND INTRAOCULAR LENS PLACEMENT (IOC) LEFT DIABETIC 6.93 01:22.0;  Surgeon: Lockie Mola, MD;  Location: [facility];  Service: Ophthalmology;  Laterality: Left;  Diabetic   COLONOSCOPY     CORONARY ANGIOPLASTY WITH STENT PLACEMENT  03/2010   Procedure: CORONARY ANGIOPLASTY WITH STENT PLACEMENT; Location: [name]   ENDARTERECTOMY FEMORAL Bilateral 11/15/2022   Procedure: BILATERAL COMMON FEMORAL PROFUNDA FEMORIS AND SUPERFICIAL FEMORAL ARTERY ENDARTECTOMIES, RIGHT FOGARTY EMBOLECTOMY OF THE RIGHT SFA  AND  POPLITEAL ARTERIES. AORTAGRAM AND RIGHT LOWER EXTREMITY ANGIOGRAM.;  Surgeon: Annice Needy, MD;  Location: [facility];  Service: Vascular;  Laterality: Bilateral;   INSERTION OF ILIAC STENT Bilateral 11/15/2022   Procedure: BILATERAL STENT INSERTION IN BILATERAL  COMMON ILIAC ARTERY, STENT INSERTION OF LEFT EXTERNAL ILIAC ARTERY. ANGIOPLASTY RIGHT TIBIAL  AND POPLITEAL ARTERY.;  Surgeon: Annice Needy, MD;  Location: [facility];  Service: Vascular;  Laterality: Bilateral;   IRRIGATION AND DEBRIDEMENT KNEE Right 04/21/2023   Procedure: IRRIGATION AND DEBRIDEMENT KNEE;  Surgeon: Campbell Stall, MD;  Location: [facility];  Service: Orthopedics;  Laterality: Right;   LOWER EXTREMITY ANGIOGRAPHY Right 05/14/2021   Procedure: LOWER EXTREMITY ANGIOGRAPHY;  Surgeon: Annice Needy, MD;  Location: [facility];  Service: Cardiovascular;  Laterality: Right;   LOWER EXTREMITY ANGIOGRAPHY Right 06/14/2021   Procedure: Lower Extremity Angiography;  Surgeon: Annice Needy, MD;  Location: [facility];  Service:  Cardiovascular;  Laterality: Right;   LOWER EXTREMITY ANGIOGRAPHY Right 11/11/2022   Procedure: Lower Extremity Angiography;  Surgeon: Annice Needy, MD;  Location: [facility];  Service: Cardiovascular;  Laterality: Right;   LOWER EXTREMITY INTERVENTION Right 06/15/2021   Procedure: LOWER EXTREMITY INTERVENTION;  Surgeon: Annice Needy, MD;  Location: [facility];  Service: Cardiovascular;  Laterality: Right;   TONSILLECTOMY     TRANSMETATARSAL AMPUTATION Right 02/17/2023   Procedure: TRANSMETATARSAL AMPUTATION;  Surgeon: Rosetta Posner, [name];  Location: [facility];  Service: Orthopedics/Podiatry;  Laterality: Right;   WOUND DEBRIDEMENT Right 01/09/2023   Procedure: DEBRIDEMENT WOUND;  Surgeon: Annice Needy, MD;  Location: [facility];  Service: Vascular;  Laterality: Right;    (Not in a hospital admission)  Social History   Socioeconomic History   Marital status: Married    Spouse name: [name]   Number of children: 3   Years of education: Not on file   Highest education level: Not on file  Occupational History   Not on file  Tobacco Use   Smoking status: Never   Smokeless tobacco: Never  Vaping Use   Vaping status: Never Used  Substance and Sexual  Activity   Alcohol use: Yes    Alcohol/week: 21.0 standard drinks of alcohol    Types: 7 Glasses of wine, 14 Cans of beer per week    Comment: occassional   Drug use: No   Sexual activity: Not on file  Other Topics Concern   Not on file  Social History Narrative   Not on file   Social Drivers of Health   Financial Resource Strain: Low Risk  (06/05/2023)   Received from [facility]   Overall Financial Resource Strain (CARDIA)    Difficulty of Paying Living Expenses: Not hard at all  Food Insecurity: No Food Insecurity (06/05/2023)   Received from [facility]   Hunger Vital Sign    Worried About Running Out of Food in the Last Year: Never true    Ran Out of Food in the Last Year:  Never true  Transportation Needs: No Transportation Needs (06/05/2023)   Received from [facility] - Transportation    In the past 12 months, has lack of transportation kept you from medical appointments or from getting medications?: No    Lack of Transportation (Non-Medical): No  Physical Activity: Not on file  Stress: Not on file  Social Connections: Unknown (04/22/2023)   Social Connection and Isolation Panel [NHANES]    Frequency of Communication with Friends and Family: More than three times a week    Frequency of Social Gatherings with Friends and Family: More than three times a week    Attends Religious Services: Patient declined    Active Member of Clubs or Organizations: No    Attends Banker Meetings: Patient declined    Marital Status: Married  Catering manager Violence: Not At Risk (04/19/2023)   Humiliation, Afraid, Rape, and Kick questionnaire    Fear of Current or Ex-Partner: No    Emotionally Abused: No    Physically Abused: No    Sexually Abused: No    Family History  Problem Relation Age of Onset   Scoliosis Mother    Heart disease Father      Vitals:   07/10/23 0238 07/10/23 0526 07/10/23 0600 07/10/23 0800  BP: (!) 159/81 (!) 156/86 (!) 153/99 (!) 144/95  Pulse: 65 68 63 72  Resp: 18 15 16 14   Temp:  97.8 F (36.6 C)    TempSrc:  Oral    SpO2: 100% 100% 100% 94%  Weight:      Height:        PHYSICAL EXAM General: Well appearing, well nourished, in no acute distress. HEENT: Normocephalic and atraumatic. Neck: No JVD.  Lungs: Normal respiratory effort on 3L [place]. Bibasilar crackles.  Heart: HRRR. Normal S1 and S2 without gallops or murmurs.  Abdomen: Distention noted, minimally improved today. Msk: Normal strength and tone for age. Extremities: Warm and well perfused. No clubbing, cyanosis. 2+ pitting edema up to L knee. S/p R BKA.  Neuro: Alert and oriented X 3. Psych: Answers questions appropriately.    Labs: Basic Metabolic Panel: Recent Labs    07/09/23 1152 07/10/23 0452  NA 132* 134*  K 5.3* 4.5  CL 99 98  CO2 27 28  GLUCOSE 288* 170*  BUN 21 24*  CREATININE 1.01 1.02  CALCIUM 9.5 9.3   Liver Function Tests: Recent Labs    07/09/23 1152  AST 21  ALT 14  ALKPHOS 83  BILITOT 0.9  PROT 7.2  ALBUMIN 3.5   Recent Labs  07/09/23 1152  LIPASE 23   CBC: Recent Labs    07/09/23 1152 07/10/23 0452  WBC 8.7 7.9  HGB 11.8* 10.7*  HCT 38.0* 35.0*  MCV 80.0 80.6  PLT 293 257   Cardiac Enzymes: Recent Labs    07/09/23 1152 07/09/23 1339 07/09/23 1615  TROPONINIHS 1,045* 944* 895*   BNP: Recent Labs    07/09/23 1152  BNP 2,395.3*   D-Dimer: No results for input(s): "DDIMER" in the last 72 hours. Hemoglobin A1C: No results for input(s): "HGBA1C" in the last 72 hours. Fasting Lipid Panel: No results for input(s): "CHOL", "HDL", "LDLCALC", "TRIG", "CHOLHDL", "LDLDIRECT" in the last 72 hours. Thyroid Function Tests: No results for input(s): "TSH", "T4TOTAL", "T3FREE", "THYROIDAB" in the last 72 hours.  Invalid input(s): "FREET3" Anemia Panel: No results for input(s): "VITAMINB12", "FOLATE", "FERRITIN", "TIBC", "IRON", "RETICCTPCT" in the last 72 hours.   Radiology: DG Chest 2 View Result Date: 07/09/2023 CLINICAL DATA:  Shortness of breath. EXAM: CHEST - 2 VIEW COMPARISON:  01/15/2023. FINDINGS: Low lung volume. Mild diffuse vascular congestion. There is subtle blunting of bilateral costophrenic angles, suggesting bilateral trace pleural effusions. Bilateral lung fields are clear. Bilateral costophrenic angles are clear. Stable cardio-mediastinal silhouette. No acute osseous abnormalities. The soft tissues are within normal limits. IMPRESSION: Mild diffuse vascular congestion and bilateral trace pleural effusions, concerning for congestive heart failure/pulmonary edema. Number Correlate clinically. Electronically Signed   By: Jules Schick M.D.   On:  07/09/2023 15:10    ECHO ordered  TELEMETRY reviewed by me 07/10/2023: sinus rhythm rate 60s  EKG reviewed by me: sinus rhythm rate 66 bpm  Data reviewed by me 07/10/2023: last 24h vitals tele labs imaging I/O hospitalist progress note  Principal Problem:   NSTEMI (non-ST elevated myocardial infarction) (HCC) Active Problems:   Coronary artery disease with history of myocardial infarction without history of CABG   Insulin dependent type 2 diabetes mellitus (HCC)   S/P angioplasty with stent   PAD (peripheral artery disease) (HCC)   Alcohol abuse   Adjustment disorder with depressed mood   Generalized weakness   Essential hypertension   Dyslipidemia   DNR (do not resuscitate)/DNI(Do Not Intubate(   S/P BKA (below knee amputation), 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 Troponins elevated. Cardiology was consulted for further evaluation.   # Acute on chronic HFrEF # Shortness of breath Patient presented to the ED with complaints of chest pain and worsening shortness of breath for the last week..  Chest x-ray in the ED with vascular congestion, BNP elevated at 2395.  Placed on supplemental oxygen at 2 L [place]. -Continue IV Lasix 60 mg twice daily.  Can consider increased dosing if suboptimal response. -Resume home Entresto 24-26 mg twice daily today.  Will resume additional GDMT as BP and renal function allow. -Monitor renal function closely with diuresis. -Monitor and replenish electrolytes for a goal K >4, Mag >2    # NSTEMI, ?type I vs type II # Coronary artery disease Patient  presenting with CP symptoms which have been worsening with worsening SOB. Troponins  1045 > 944.  EKG demonstrates normal sinus rhythm rate 66 bpm, poor R wave progression but otherwise no acute ST-T changes.  Patient started on heparin infusion. -Continue heparin infusion, will plan to discontinue tomorrow.  Continue atorvastatin, aspirin, Plavix. -Will discontinue nitroglycerin infusion. -Suspect chest pain/NSTEMI likely demand secondary to acute heart failure.  No plan for further cardiac workup at this time.  Can consider outpatient catheterization likely with right and left heart cath but would defer at this time.  # Severe PVD with LE ulceration # Diabetic neuropathy S/p multiple interventions in the past and R BKA.  -Continue home statin, plavix.    This patient's plan of care was discussed and created with Dr. Darrold Junker and he is in agreement.  Signed: Gale Journey, PA-C  07/10/2023, 9:03 AM [facility]

## 2023-07-10 NOTE — ED Notes (Signed)
 Assessed pressure dressing and right AC is still bleeding.  Re-enforced and monitoing ongoing.

## 2023-07-10 NOTE — ED Notes (Signed)
 Pt still denies chest pain, will turn off nitroglycerin for now. Pt made aware to let RN know if chest pain returns

## 2023-07-10 NOTE — Progress Notes (Signed)
 Peristent oozing around all IV sites, no other bleeding, hemodynamically stable. Is on asa and plavix and now heparin, suspect those are the culprits. Discussed w/ cardiology and hematology. Will f/u dic panel, hold heparin and antiplatelets for now, monitor closely.

## 2023-07-10 NOTE — ED Notes (Signed)
 Transported to [place] for [place]

## 2023-07-10 NOTE — ED Notes (Signed)
 Placed on 2L [place] due to patient desat in 80's  MD [facility] notified

## 2023-07-10 NOTE — ED Notes (Signed)
 Patient complaining of chest pain again, MD notified as well as cardiology.  Reports repeat EKG has no change and new order for maalox.

## 2023-07-10 NOTE — ED Notes (Signed)
 Transported called to transport patient upstairs.  Patient currently sitting on the side of bed eating.

## 2023-07-10 NOTE — Consult Note (Addendum)
 Consultation Note Date: 07/10/2023   Patient Name: Manuel [name]  DOB: [birth year]  MRN: [id number]  Age / Sex: 78 y.o., male  PCP: Gracelyn Nurse, MD Referring Physician: Kathrynn Running, MD  Reason for Consultation: Establishing goals of care  HPI/Patient Profile:  [name] is a 78 year old male with history of CAD, PAD, history of right BKA, hyperlipidemia, hypertension, depression, anxiety, insomnia, who presents to the emergency department for chief concerns of shortness of breath, decreased urine output, and inability to sleep for the last year.   Clinical Assessment and Goals of Care: Notes and labs reviewed.  Patient is familiar to me and other PMT team members.   Into see patient.  He is currently sitting on the side of the bed eating breakfast, wife is at bedside.  We discussed the events since I met with him last in September 2024.   He states he is waiting for a prosthetic for his right stump.  He has a stump shrinker in place currently.  He discusses shortness of breath and abdominal pain that have been present, in addition to the acute chest pain that brought him in this admission.  He discusses his quality of life.  He states his quality of life has been poor such that he feels like he is ready to shift to comfort care.  He states all he does is sit in the chair watching television, as this is all he feels like doing.  He is clear this is not acceptable quality of life.  He states a couple of years ago he was able to get in his truck and ride around with his dog, and do things independently to enjoy his retirement.    We discussed his diagnoses, prognosis, GOC, EOL wishes disposition and options.  Created space and opportunity for patient  to explore thoughts and feelings regarding current medical information.   A detailed discussion was had today regarding advanced directives.   Concepts specific to code status, artifical feeding and hydration, IV antibiotics and rehospitalization were discussed.  The difference between an aggressive medical intervention path and a comfort care path was discussed.  Values and goals of care important to patient and family were attempted to be elicited.  Discussed limitations of medical interventions to prolong quality of life in some situations and discussed the concept of human mortality.    He discusses being followed by [facility] cardiology, and wanting to be evaluated and followed by [facility] cardiology team.  He states if his quality of life cannot be made acceptable, then he would want hospice care implemented until end-of-life.  Patient is clear that he cannot afford Entresto, and advises he has tried to explain this previously.    He states acceptable quality of life is: an ability to be independent and able to drive and go places by himself, and to have better energy and actually feel like leaving the house.  He states if he cannot have this quality of life, he would like hospice level care starting  with hospice at home until end-of-life.  He confirms DNR and DNI status.  SUMMARY OF RECOMMENDATIONS   Confirms DNR/DNI status. Wife would be surrogate decision maker if patient is unable to make decisions for himself.  Patient would like evaluation and initiation of care by [facility] Cardiology team.  He is extremely clear that acceptable quality of life is: an ability to be independent and able to drive and go places by himself, and to have better energy and actually feel like leaving the house.  He states if he cannot have this quality of life, he would like hospice level care starting with hospice at home until end-of-life.      Primary Diagnoses: Present on Admission:  NSTEMI (non-ST elevated myocardial infarction) (HCC)  PAD (peripheral artery disease) (HCC)  Essential hypertension  Dyslipidemia  DNR (do not resuscitate)/DNI(Do  Not Intubate(  Adjustment disorder with depressed mood  Alcohol abuse  BPH (benign prostatic hyperplasia)  Chronic combined systolic and diastolic CHF (congestive heart failure) (HCC)  Chronic hyponatremia - baseline Na 128-130.   I have reviewed the medical record, interviewed the patient and family, and examined the patient. The following aspects are pertinent.  Past Medical History:  Diagnosis Date   Acute ST elevation myocardial infarction (STEMI) of inferior wall (HCC) 04/19/2010   a.) transfered [facility] --> LHC/PCI (very difficult procedure) --> 3.0 x 23 mm and 3.0 x 12 mm Xience stents to RCA   Allergies    Arthritis    Benign essential hypertension    Bilateral carotid artery disease (HCC) 05/08/2021   a.) carotid doppler 05/08/2021: 1-39% BICA   CAD (coronary artery disease) 04/19/2010   a.) inferior STEMI 04/19/2010 --> LHC/PCI: 50-70% pD1, 80% pRI, 90/90/90% RCA (overlapping 3.0 x 23 and 3.0 x 12 mm Xience DES); b.) MV 11/10/2018: fixed minimally reversible inferior perfusion defect   Cellulitis of foot    DDD (degenerative disc disease), cervical    Diabetes mellitus type 2, insulin dependent (HCC)    Diverticulosis    Full dentures    Gout    Hard of hearing    History of bilateral cataract extraction 2022   History of ETOH abuse    Hyperlipidemia    Ischemic cardiomyopathy 04/19/2010   a.) TTE 04/19/2010: 40%; b.) TTE 04/20/2014: EF >55%, mild RVE, triv PR, mild MR/TR, G1DD; c.) TTE 11/10/2018: EF 45%, inf HK, mild RVE, triv TR/PR, mild MR, G1DD; d.) TTE 11/14/2022: EF 25-30%, basal anteroseptal, apical lateral, apical septal, and apex AK, mid anterolateral HK, mild-mod MR/TR   Long term current use of aspirin    Long term current use of clopidogrel    Lumbar degenerative disc disease    Lumbar radiculopathy    Lumbar vertebral fracture (chronic superior endplate of L1)    NSTEMI (non-ST elevated myocardial infarction) (HCC) 01/15/2023   OSA (obstructive  sleep apnea)    a.) unable to tolerate nocturnal PAP therapy   Peripheral artery disease (HCC)    a.) stenting 05/14/21: 12 mm x 12 cm LifeStent RIGHT dis SFA/prox pop; b.) s/p cath directed thrombolysis RIGHT SFA/pop 06/14/21; c.) s/p mech thrombectomy + stenting 06/15/21: 8 mm x 25cm & 8 mm x 7.5cm Viabahn; d.) s/p BILAT CFA, profunda femoris, SFA endarterectomies + fogarty embolectomy + stenting 11/15/22: 12mm x 58mm Lifestream BILAT CIAs, 14 mm x 6 cm Lifestream & 13 mm x 5 cm Viabahn LEFT EIA   Peripheral neuropathy    Umbilical hernia    Social History  Socioeconomic History   Marital status: Married    Spouse name: [name]   Number of children: 3   Years of education: Not on file   Highest education level: Not on file  Occupational History   Not on file  Tobacco Use   Smoking status: Never   Smokeless tobacco: Never  Vaping Use   Vaping status: Never Used  Substance and Sexual Activity   Alcohol use: Yes    Alcohol/week: 21.0 standard drinks of alcohol    Types: 7 Glasses of wine, 14 Cans of beer per week    Comment: occassional   Drug use: No   Sexual activity: Not on file  Other Topics Concern   Not on file  Social History Narrative   Not on file   Social Drivers of Health   Financial Resource Strain: Low Risk  (06/05/2023)   Received from [facility]   Overall Financial Resource Strain (CARDIA)    Difficulty of Paying Living Expenses: Not hard at all  Food Insecurity: No Food Insecurity (06/05/2023)   Received from [facility]   Hunger Vital Sign    Worried About Running Out of Food in the Last Year: Never true    Ran Out of Food in the Last Year: Never true  Transportation Needs: No Transportation Needs (06/05/2023)   Received from [facility] - Transportation    In the past 12 months, has lack of transportation kept you from medical appointments or from getting medications?: No    Lack of  Transportation (Non-Medical): No  Physical Activity: Not on file  Stress: Not on file  Social Connections: Unknown (04/22/2023)   Social Connection and Isolation Panel [NHANES]    Frequency of Communication with Friends and Family: More than three times a week    Frequency of Social Gatherings with Friends and Family: More than three times a week    Attends Religious Services: Patient declined    Database administrator or Organizations: No    Attends Engineer, structural: Patient declined    Marital Status: Married   Family History  Problem Relation Age of Onset   Scoliosis Mother    Heart disease Father    Scheduled Meds:  alum & mag hydroxide-simeth  30 mL Oral Once   aspirin EC  81 mg Oral Daily   atorvastatin  80 mg Oral QHS   clopidogrel  75 mg Oral Daily   folic acid  1 mg Oral Daily   furosemide  60 mg Intravenous BID   insulin aspart  0-5 Units Subcutaneous QHS   insulin aspart  0-9 Units Subcutaneous TID WC   multivitamin with minerals  1 tablet Oral Daily   sacubitril-valsartan  1 tablet Oral Q12H   thiamine  100 mg Oral Daily   Or   thiamine  100 mg Intravenous Daily   traZODone  100 mg Oral QHS   Continuous Infusions:  heparin 1,300 Units/hr (07/10/23 0540)   PRN Meds:.acetaminophen **OR** acetaminophen, ondansetron **OR** ondansetron (ZOFRAN) IV Medications Prior to Admission:  Prior to Admission medications   Medication Sig Start Date End Date Taking? Authorizing Provider  aspirin EC 81 MG tablet Take 1 tablet (81 mg total) by mouth daily. 11/26/22  Yes Delfino Lovett, MD  atorvastatin (LIPITOR) 80 MG tablet Take 1 tablet (80 mg total) by mouth daily. Patient taking differently: Take 80 mg by mouth at bedtime. 11/26/22  Yes Delfino Lovett, MD  carvedilol (COREG) 25 MG tablet Take 25 mg by mouth 2 (two) times daily with a meal.   Yes [provider]  clopidogrel (PLAVIX) 75 MG tablet TAKE 1 TABLET(75 MG) BY MOUTH DAILY 05/01/23  Yes Georgiana Spinner, NP   ferrous sulfate 325 (65 FE) MG EC tablet Take 325 mg by mouth daily with breakfast.   Yes [provider]  folic acid (FOLVITE) 1 MG tablet Take 1 tablet (1 mg total) by mouth daily. 02/20/23  Yes Arnetha Courser, MD  furosemide (LASIX) 20 MG tablet Take 20 mg by mouth daily. For 10 days   Yes [provider]  insulin aspart protamine- aspart (NOVOLOG MIX 70/30) (70-30) 100 UNIT/ML injection Inject 30 Units into the skin 2 (two) times daily with a meal.   Yes [provider]  methocarbamol (ROBAXIN) 500 MG tablet Take 500 mg by mouth every 8 (eight) hours as needed for muscle spasms.   Yes [provider]  mirtazapine (REMERON) 30 MG tablet Take 1 tablet by mouth at bedtime. 05/27/23  Yes [provider]  ondansetron (ZOFRAN) 4 MG tablet Take 1 tablet (4 mg total) by mouth every 6 (six) hours as needed for nausea. 05/08/23  Yes Carollee Herter, DO  polyethylene glycol (MIRALAX / GLYCOLAX) 17 g packet Take 17 g by mouth daily as needed for mild constipation. Patient taking differently: Take 17 g by mouth daily. 02/19/23  Yes Arnetha Courser, MD  sacubitril-valsartan (ENTRESTO) 24-26 MG Take 1 tablet by mouth every 12 (twelve) hours. 01/28/23 01/28/24 Yes [provider]  senna-docusate (SENOKOT-S) 8.6-50 MG tablet Take 1 tablet by mouth at bedtime as needed for mild constipation. Patient taking differently: Take 1 tablet by mouth at bedtime. 06/17/21  Yes Burnadette Pop, MD  spironolactone (ALDACTONE) 50 MG tablet Take 1 tablet (50 mg total) by mouth daily. 11/26/22 01/01/24 Yes Delfino Lovett, MD  thiamine (VITAMIN B-1) 100 MG tablet Take 1 tablet (100 mg total) by mouth daily. 02/20/23  Yes Arnetha Courser, MD  traZODone (DESYREL) 150 MG tablet Take 75 mg by mouth at bedtime.   Yes [provider]  DULoxetine (CYMBALTA) 20 MG capsule Take 1 capsule (20 mg total) by mouth 2 (two) times daily. Patient not taking: Reported on 07/10/2023 05/08/23 08/06/23  Carollee Herter, DO  Ensure Max Protein (ENSURE MAX PROTEIN) LIQD Take 330 mLs (11 oz total) by mouth 2 (two) times daily. Patient taking differently: Take 11 oz by mouth daily. 11/26/22   Delfino Lovett, MD  tamsulosin (FLOMAX) 0.4 MG CAPS capsule Take 1 capsule (0.4 mg total) by mouth daily after supper. Patient not taking: Reported on 07/10/2023 05/08/23 08/06/23  Carollee Herter, DO   No Known Allergies Review of Systems  Constitutional:  Positive for fatigue.  Respiratory:  Positive for shortness of breath.     Physical Exam Pulmonary:     Effort: Pulmonary effort is normal.  Neurological:     Mental Status: He is alert.     Vital Signs: BP 128/70   Pulse 71   Temp 98.2 F (36.8 C) (Oral)   Resp (!) 27   Ht 5' 7.99" (1.727 m)   Wt 95.7 kg   SpO2 96%   BMI 32.09 kg/m  Pain Scale: 0-10   Pain Score: 8    SpO2: SpO2: 96 % O2 Device:SpO2: 96 % O2 Flow Rate: .O2 Flow Rate (L/min): 3 L/min  IO: Intake/output summary: No intake or output data in the 24 hours ending 07/10/23  1211  LBM: Last BM Date : 07/09/23 Baseline Weight: Weight: 95.7 kg Most recent weight: Weight: 95.7 kg      Signed by: Morton Stall, NP  Primary team made aware of conversations.   Please contact Palliative Medicine Team phone at [phone] for questions and concerns.  For individual provider: See Loretha Stapler

## 2023-07-10 NOTE — Progress Notes (Signed)
 PROGRESS NOTE    Manuel [name]  ZOX:[id number] DOB: [birth year] DOA: 07/09/2023 PCP: Gracelyn Nurse, MD  Outpatient Specialists: cardiology    Brief Narrative:   From admission h and p  [name] is a 78 year old male with history of CAD, PAD, history of right BKA, hyperlipidemia, hypertension, depression, anxiety, insomnia, who presents to the emergency department for chief concerns of shortness of breath, decreased urine output, and inability to sleep for the last year.   He reports he has been short of breath for about 1 year and the shortness of breath is worse when he lays down at night.  He reports this has caused him difficulty sleeping for 1 year.  He reports that he does get so tired that he is so short of breath when he lays down that he cannot go to sleep.   He reports that shortness of breath has worsened over the last week.  He reports there is also swelling of his lower extremity (left only).  He denies fever, chills, nausea, vomiting, cough, syncope.  He endorses chest pressure/chest discomfort especially when he lays flat at night.  He denies trauma to his person.  Assessment & Plan:   Principal Problem:   NSTEMI (non-ST elevated myocardial infarction) (HCC) Active Problems:   PAD (peripheral artery disease) (HCC)   Generalized weakness   Alcohol abuse   Coronary artery disease with history of myocardial infarction without history of CABG   Insulin dependent type 2 diabetes mellitus (HCC)   Chronic combined systolic and diastolic CHF (congestive heart failure) (HCC)   Chronic hyponatremia - baseline Na 128-130.   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 With chf  exacerbation. History stents - cardiology following, not planning on intervention at this time - continue IV heparin - home atorvastatin  # HFrEF with acute exacerbation Volume overloaded - continue IV diuresis per cardiology - TTE ordered - home entresto continued - home coreg held  # End-of-life care Patient reports declining health last 1-2 years, is interested in a comfort-based approach - palliative consult  # PAD S/p recent right AKA that is healed. Non-palpable LLE pulses - ABI LLE - home statin, plavix  # EtOH Reports drinking most days, vague on amount, says last drink a week ago, denies hx withdrawal - monitor  # Insomnia His main complaint - home trazodone, mirtazapine  # T2DM Glucose appropriate - SSI - home 70/30 but will order 20 bid instead of 30 bid   DVT prophylaxis: therapeutic heparin Code Status: dnr/dni Family Communication: none at bedside. No answer when wife called, voicemail full  Level of care: Progressive Status is: Inpatient Remains inpatient appropriate because: severity of illness    Consultants:  cardiology  Procedures: None thus far  Antimicrobials:  none    Subjective: Reports chest pain resolved, breathing stable  Objective: Vitals:   07/10/23 0600 07/10/23 0800 07/10/23 0900 07/10/23 0909  BP: (!) 153/99 (!) 144/95 (!) 141/85   Pulse: 63 72 62   Resp: 16 14 16    Temp:    98.2 F (36.8 C)  TempSrc:    Oral  SpO2: 100% 94% 100%   Weight:      Height:  No intake or output data in the 24 hours ending 07/10/23 0938 Filed Weights   07/09/23 1330  Weight: 95.7 kg    Examination:  General exam: Appears calm and comfortable  Respiratory system: Clear to auscultation. Respiratory effort normal. Cardiovascular system: S1 & S2 heard, RRR.soft systolic murmur Gastrointestinal system: Abdomen is obese, soft and nontender. No organomegaly or masses felt.  Central nervous system: Alert and oriented. No focal  neurological deficits. Extremities: healed right AKA. Edema LLE with non-palpable pulses Skin: No rashes, lesions or ulcers Psychiatry: Judgement and insight appear normal. Mood & affect appropriate.     Data Reviewed: I have personally reviewed following labs and imaging studies  CBC: Recent Labs  Lab 07/09/23 1152 07/10/23 0452  WBC 8.7 7.9  HGB 11.8* 10.7*  HCT 38.0* 35.0*  MCV 80.0 80.6  PLT 293 257   Basic Metabolic Panel: Recent Labs  Lab 07/09/23 1152 07/10/23 0452  NA 132* 134*  K 5.3* 4.5  CL 99 98  CO2 27 28  GLUCOSE 288* 170*  BUN 21 24*  CREATININE 1.01 1.02  CALCIUM 9.5 9.3   GFR: Estimated Creatinine Clearance: 65.9 mL/min (by C-G formula based on SCr of 1.02 mg/dL). Liver Function Tests: Recent Labs  Lab 07/09/23 1152  AST 21  ALT 14  ALKPHOS 83  BILITOT 0.9  PROT 7.2  ALBUMIN 3.5   Recent Labs  Lab 07/09/23 1152  LIPASE 23   No results for input(s): "AMMONIA" in the last 168 hours. Coagulation Profile: Recent Labs  Lab 07/09/23 1407  INR 1.4*   Cardiac Enzymes: No results for input(s): "CKTOTAL", "CKMB", "CKMBINDEX", "TROPONINI" in the last 168 hours. BNP (last 3 results) No results for input(s): "PROBNP" in the last 8760 hours. HbA1C: No results for input(s): "HGBA1C" in the last 72 hours. CBG: Recent Labs  Lab 07/09/23 1721 07/09/23 2240 07/10/23 0741  GLUCAP 218* 163* 173*   Lipid Profile: No results for input(s): "CHOL", "HDL", "LDLCALC", "TRIG", "CHOLHDL", "LDLDIRECT" in the last 72 hours. Thyroid Function Tests: No results for input(s): "TSH", "T4TOTAL", "FREET4", "T3FREE", "THYROIDAB" in the last 72 hours. Anemia Panel: No results for input(s): "VITAMINB12", "FOLATE", "FERRITIN", "TIBC", "IRON", "RETICCTPCT" in the last 72 hours. Urine analysis:    Component Value Date/Time   COLORURINE STRAW (A) 04/18/2023 1758   APPEARANCEUR CLEAR (A) 04/18/2023 1758   LABSPEC 1.005 04/18/2023 1758   PHURINE 5.0 04/18/2023  1758   GLUCOSEU >=500 (A) 04/18/2023 1758   HGBUR SMALL (A) 04/18/2023 1758   BILIRUBINUR NEGATIVE 04/18/2023 1758   KETONESUR NEGATIVE 04/18/2023 1758   PROTEINUR NEGATIVE 04/18/2023 1758   NITRITE NEGATIVE 04/18/2023 1758   LEUKOCYTESUR NEGATIVE 04/18/2023 1758   Sepsis Labs: @LABRCNTIP (procalcitonin:4,lacticidven:4)  )No results found for this or any previous visit (from the past 240 hours).       Radiology Studies: DG Chest 2 View Result Date: 07/09/2023 CLINICAL DATA:  Shortness of breath. EXAM: CHEST - 2 VIEW COMPARISON:  01/15/2023. FINDINGS: Low lung volume. Mild diffuse vascular congestion. There is subtle blunting of bilateral costophrenic angles, suggesting bilateral trace pleural effusions. Bilateral lung fields are clear. Bilateral costophrenic angles are clear. Stable cardio-mediastinal silhouette. No acute osseous abnormalities. The soft tissues are within normal limits. IMPRESSION: Mild diffuse vascular congestion and bilateral trace pleural effusions, concerning for congestive heart failure/pulmonary edema. Number Correlate clinically. Electronically Signed   By: Jules Schick M.D.   On: 07/09/2023 15:10        Scheduled Meds:  aspirin EC  81  mg Oral Daily   atorvastatin  80 mg Oral QHS   clopidogrel  75 mg Oral Daily   folic acid  1 mg Oral Daily   furosemide  60 mg Intravenous BID   insulin aspart  0-5 Units Subcutaneous QHS   insulin aspart  0-9 Units Subcutaneous TID WC   multivitamin with minerals  1 tablet Oral Daily   sacubitril-valsartan  1 tablet Oral Q12H   thiamine  100 mg Oral Daily   Or   thiamine  100 mg Intravenous Daily   traZODone  100 mg Oral QHS   Continuous Infusions:  heparin 1,300 Units/hr (07/10/23 0540)     LOS: 1 day     Silvano Bilis, MD [facility]   If 7PM-7AM, please contact night-coverage [email or web address] Password [email or web address] 07/10/2023, 9:38 AM

## 2023-07-11 ENCOUNTER — Encounter: Payer: Self-pay | Admitting: Internal Medicine

## 2023-07-11 ENCOUNTER — Telehealth (HOSPITAL_COMMUNITY): Payer: Self-pay | Admitting: Pharmacy Technician

## 2023-07-11 ENCOUNTER — Other Ambulatory Visit (HOSPITAL_COMMUNITY): Payer: Self-pay

## 2023-07-11 DIAGNOSIS — Z515 Encounter for palliative care: Secondary | ICD-10-CM | POA: Diagnosis not present

## 2023-07-11 DIAGNOSIS — I739 Peripheral vascular disease, unspecified: Secondary | ICD-10-CM

## 2023-07-11 DIAGNOSIS — I5042 Chronic combined systolic (congestive) and diastolic (congestive) heart failure: Secondary | ICD-10-CM

## 2023-07-11 DIAGNOSIS — I2510 Atherosclerotic heart disease of native coronary artery without angina pectoris: Secondary | ICD-10-CM

## 2023-07-11 DIAGNOSIS — I252 Old myocardial infarction: Secondary | ICD-10-CM

## 2023-07-11 DIAGNOSIS — I214 Non-ST elevation (NSTEMI) myocardial infarction: Secondary | ICD-10-CM | POA: Diagnosis not present

## 2023-07-11 LAB — CBC
HCT: 33.3 % — ABNORMAL LOW (ref 39.0–52.0)
Hemoglobin: 10.6 g/dL — ABNORMAL LOW (ref 13.0–17.0)
MCH: 24.8 pg — ABNORMAL LOW (ref 26.0–34.0)
MCHC: 31.8 g/dL (ref 30.0–36.0)
MCV: 78 fL — ABNORMAL LOW (ref 80.0–100.0)
Platelets: 246 10*3/uL (ref 150–400)
RBC: 4.27 MIL/uL (ref 4.22–5.81)
RDW: 15.9 % — ABNORMAL HIGH (ref 11.5–15.5)
WBC: 6.5 10*3/uL (ref 4.0–10.5)
nRBC: 0 % (ref 0.0–0.2)

## 2023-07-11 LAB — ECHOCARDIOGRAM COMPLETE
AR max vel: 2.08 cm2
AV Area VTI: 2.44 cm2
AV Area mean vel: 2.1 cm2
AV Mean grad: 2 mmHg
AV Peak grad: 3.9 mmHg
Ao pk vel: 0.99 m/s
Area-P 1/2: 5.34 cm2
Calc EF: 28.7 %
Height: 67.992 in
MV VTI: 1.73 cm2
S' Lateral: 4.3 cm
Single Plane A2C EF: 27.5 %
Single Plane A4C EF: 28.2 %
Weight: 3376 [oz_av]

## 2023-07-11 LAB — BASIC METABOLIC PANEL
Anion gap: 11 (ref 5–15)
BUN: 24 mg/dL — ABNORMAL HIGH (ref 8–23)
CO2: 25 mmol/L (ref 22–32)
Calcium: 9.1 mg/dL (ref 8.9–10.3)
Chloride: 96 mmol/L — ABNORMAL LOW (ref 98–111)
Creatinine, Ser: 0.96 mg/dL (ref 0.61–1.24)
GFR, Estimated: >60 mL/min (ref >60)
Glucose, Bld: 200 mg/dL — ABNORMAL HIGH (ref 70–99)
Potassium: 4.1 mmol/L (ref 3.5–5.1)
Sodium: 132 mmol/L — ABNORMAL LOW (ref 135–145)

## 2023-07-11 LAB — GLUCOSE, CAPILLARY
Glucose-Capillary: 176 mg/dL — ABNORMAL HIGH (ref 70–99)
Glucose-Capillary: 183 mg/dL — ABNORMAL HIGH (ref 70–99)
Glucose-Capillary: 183 mg/dL — ABNORMAL HIGH (ref 70–99)
Glucose-Capillary: 118 mg/dL — ABNORMAL HIGH (ref 70–99)

## 2023-07-11 LAB — HEPARIN LEVEL (UNFRACTIONATED): Heparin Unfractionated: <0.1 [IU]/mL — ABNORMAL LOW (ref 0.30–0.70)

## 2023-07-11 LAB — MAGNESIUM: Magnesium: 1.8 mg/dL (ref 1.7–2.4)

## 2023-07-11 MED ORDER — SPIRONOLACTONE 12.5 MG HALF TABLET
12.5000 mg | ORAL_TABLET | Freq: Every day | ORAL | Status: DC
  Administered 2023-07-11 – 2023-07-13 (×3): 12.5 mg via ORAL
  Filled 2023-07-11 (×3): qty 1

## 2023-07-11 MED ORDER — INFLUENZA VAC A&B SURF ANT ADJ 0.5 ML IM SUSY
0.5000 mL | PREFILLED_SYRINGE | INTRAMUSCULAR | Status: AC
  Administered 2023-07-13: 0.5 mL via INTRAMUSCULAR
  Filled 2023-07-11: qty 0.5

## 2023-07-11 MED ORDER — ENOXAPARIN SODIUM 60 MG/0.6ML IJ SOSY
45.0000 mg | PREFILLED_SYRINGE | INTRAMUSCULAR | Status: DC
  Administered 2023-07-11 – 2023-07-15 (×5): 45 mg via SUBCUTANEOUS
  Filled 2023-07-11 (×5): qty 0.6

## 2023-07-11 MED ORDER — CARVEDILOL 12.5 MG PO TABS
12.5000 mg | ORAL_TABLET | Freq: Two times a day (BID) | ORAL | Status: DC
  Administered 2023-07-11: 12.5 mg via ORAL
  Filled 2023-07-11 (×2): qty 1

## 2023-07-11 MED ORDER — ENOXAPARIN SODIUM 40 MG/0.4ML IJ SOSY: 40.0000 mg | PREFILLED_SYRINGE | INTRAMUSCULAR | Status: DC

## 2023-07-11 MED ORDER — MAGNESIUM SULFATE IN D5W 1-5 GM/100ML-% IV SOLN
1.0000 g | Freq: Once | INTRAVENOUS | Status: AC
  Administered 2023-07-11: 1 g via INTRAVENOUS
  Filled 2023-07-11: qty 100

## 2023-07-11 MED ORDER — CLOPIDOGREL BISULFATE 75 MG PO TABS
75.0000 mg | ORAL_TABLET | Freq: Every day | ORAL | Status: DC
  Administered 2023-07-11 – 2023-07-16 (×5): 75 mg via ORAL
  Filled 2023-07-11 (×6): qty 1

## 2023-07-11 MED ORDER — ENSURE ENLIVE PO LIQD
237.0000 mL | Freq: Two times a day (BID) | ORAL | Status: DC
  Administered 2023-07-12 – 2023-07-16 (×7): 237 mL via ORAL

## 2023-07-11 MED ORDER — INSULIN ASPART PROT & ASPART (70-30 MIX) 100 UNIT/ML ~~LOC~~ SUSP
15.0000 [IU] | Freq: Two times a day (BID) | SUBCUTANEOUS | Status: DC
  Administered 2023-07-11 – 2023-07-15 (×6): 15 [IU] via SUBCUTANEOUS
  Filled 2023-07-11 (×2): qty 10

## 2023-07-11 NOTE — Progress Notes (Signed)
 Daily Progress Note   Patient Name: Manuel [name]       Date: 07/11/2023 DOB: [birth year]  Age: 78 y.o. MRN#: [id number] Attending Physician: Kathrynn Running, MD Primary Care Physician: Gracelyn Nurse, MD Admit Date: 07/09/2023  Reason for Consultation/Follow-up: Establishing goals of care  HPI/Brief Hospital Review: [name] is a 78 year old male with history of CAD, PAD, history of right BKA, hyperlipidemia, hypertension, depression, anxiety, insomnia, who presents to the emergency department for chief concerns of shortness of breath, decreased urine output, and inability to sleep for the last year.   Palliative Medicine consulted for assisting with goals of care conversations.  Subjective: Extensive chart review has been completed prior to meeting patient including labs, vital signs, imaging, progress notes, orders, and available advanced directive documents from current and previous encounters.    Visited with [name] at his bedside. He is awake, alert, and able to engage in conversations. Wife is at bedside during time of visit.  [name] able to recall previous visits with PMT and able to appropriately recall conversations regarding options for plans/goals of care.  [name] shares that he was visited by cardiology who recommended cardiac catheterization, at this time he is not interested in pursuing this intervention.  He again speaks to the importance of quality of life and what is important to him understanding that his life is likely limited.  We further discuss the role of hospice at home and types of services offered.  His wife requests to review a current list of medications as she shares since his last hospitalization multiple providers have made  adjustments. Will bring in list over weekend for review.  Continues to struggle with insomnia, slept really well last night with current dose of Trazodone prescribed.  [name] shares he would like more time to consider his options and speak more with his wife.  Answered and addressed all questions and concerns. PMT to continue to follow for ongoing needs and support.   Objective:  Physical Exam Constitutional:      General: He is not in acute distress.    Appearance: He is ill-appearing.  Pulmonary:     Effort: Pulmonary effort is normal. No respiratory distress.  Skin:    General: Skin is warm and dry.  Neurological:     Mental  Status: He is alert and oriented to person, place, and time.     Motor: Weakness present.  Psychiatric:        Mood and Affect: Mood normal.        Thought Content: Thought content normal.             Vital Signs: BP 138/82 (BP Location: Left Arm)   Pulse 62   Temp (!) 89 F (31.7 C) (Oral)   Resp 20   Ht 5' 7.99" (1.727 m)   Wt 95.7 kg   SpO2 98%   BMI 32.09 kg/m  SpO2: SpO2: 98 % O2 Device: O2 Device: Room Air O2 Flow Rate: O2 Flow Rate (L/min): 2 L/min   Thank you for allowing the Palliative Medicine Team to assist in the care of this patient.  Total time:  35 minutes  Time spent includes: Detailed review of medical records (labs, imaging, vital signs), medically appropriate exam (mental status, respiratory, cardiac, skin), discussed with treatment team, counseling and educating patient, family and staff, documenting clinical information, medication management and coordination of care.  Leeanne Deed, DNP, AGNP-C Palliative Medicine   Please contact Palliative Medicine Team phone at [phone] for questions and concerns.

## 2023-07-11 NOTE — Progress Notes (Signed)
 Patient wants to sleep and does not want to be disturbed for 12 midnight vitals.

## 2023-07-11 NOTE — Progress Notes (Signed)
 PROGRESS NOTE    Manuel [name]  ZOX:[id number] DOB: December 25, 1945 DOA: 07/09/2023 PCP: Gracelyn Nurse, MD  Outpatient Specialists: cardiology    Brief Narrative:   From admission h and p  [name] is a 78 year old male with history of CAD, PAD, history of right BKA, hyperlipidemia, hypertension, depression, anxiety, insomnia, who presents to the emergency department for chief concerns of shortness of breath, decreased urine output, and inability to sleep for the last year.   He reports he has been short of breath for about 1 year and the shortness of breath is worse when he lays down at night.  He reports this has caused him difficulty sleeping for 1 year.  He reports that he does get so tired that he is so short of breath when he lays down that he cannot go to sleep.   He reports that shortness of breath has worsened over the last week.  He reports there is also swelling of his lower extremity (left only).  He denies fever, chills, nausea, vomiting, cough, syncope.  He endorses chest pressure/chest discomfort especially when he lays flat at night.  He denies trauma to his person.  Assessment & Plan:   Principal Problem:   NSTEMI (non-ST elevated myocardial infarction) (HCC) Active Problems:   PAD (peripheral artery disease) (HCC)   Generalized weakness   Alcohol abuse   Coronary artery disease with history of myocardial infarction without history of CABG   Insulin dependent type 2 diabetes mellitus (HCC)   Chronic combined systolic and diastolic CHF (congestive heart failure) (HCC)   Chronic hyponatremia - baseline Na 128-130.   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 With chf  exacerbation, RWM on TTE. History stents - cardiology following, not planning on intervention at this time - continue IV heparin - home atorvastatin - cath discussion w/ patient today, he is considering  # Oozing around IV sites Happened all day yesterday, stopped with holding heparin, dic panel neg. Home plavix and asa he received here likely also contributing - carefully resume heparin today  # HFrEF with acute exacerbation Volume overloaded. I/os not accurate as patient has been dumping his urine down the sink (he says he will stop). But orthopnea and lower extremity edema improving. TTE with EF 25-30 (stable from prior), regional wall motion abnormalities. - continue IV diuresis   - home entresto continued - home coreg held  # End-of-life care Patient reports declining health last 1-2 years, is somewhat interested in a comfort-based approach - palliative consulted  # PAD S/p recent right AKA that is healed. Non-palpable LLE pulses. ABI of the LLE shows borderline PAD - outpt vascular f/u - home statin, plavix  # EtOH Reports drinking most days, vague on amount, says last drink a week ago, denies hx withdrawal - monitor  # Insomnia His main complaint. Improved w/ diuresis so orthopnea/pnd likely contributory - home trazodone, mirtazapine  # T2DM Glucose appropriate - SSI - home 70/30, is on 30 bid at home, will resume but at 15 bid   DVT prophylaxis: therapeutic heparin Code Status: dnr/dni Family Communication: none at bedside.    Level of care: Progressive Status is: Inpatient Remains inpatient appropriate because:  severity of illness    Consultants:  Cardiology, palliative  Procedures: None thus far  Antimicrobials:  none    Subjective: Reports chest pain resolved, breathing stable, slept better last night  Objective: Vitals:   07/10/23 1857 07/10/23 2040 07/11/23 0500 07/11/23 1158  BP: (!) 158/100 (!) 145/80 (!) 143/83 138/82  Pulse: 76 70 62    Resp:  18 (!) 21 20  Temp: (!) 97.5 F (36.4 C) 97.8 F (36.6 C) 97.7 F (36.5 C) 98 F (36.7 C)  TempSrc: Oral  Oral Oral  SpO2: 100% 100% 97% 98%  Weight:      Height:        Intake/Output Summary (Last 24 hours) at 07/11/2023 1415 Last data filed at 07/11/2023 1200 Gross per 24 hour  Intake 456.77 ml  Output 525 ml  Net -68.23 ml   Filed Weights   07/09/23 1330  Weight: 95.7 kg    Examination:  General exam: Appears calm and comfortable  Respiratory system: Clear to auscultation. Respiratory effort normal. Cardiovascular system: S1 & S2 heard, RRR.soft systolic murmur Gastrointestinal system: Abdomen is obese, soft and nontender. No organomegaly or masses felt.  Central nervous system: Alert and oriented. No focal neurological deficits. Extremities: healed right AKA. Edema LLE improving Skin: No rashes, lesions or ulcers Psychiatry: Judgement and insight appear normal. Mood & affect appropriate.     Data Reviewed: I have personally reviewed following labs and imaging studies  CBC: Recent Labs  Lab 07/09/23 1152 07/10/23 0452 07/10/23 1717 07/11/23 0407  WBC 8.7 7.9 9.2 6.5  HGB 11.8* 10.7* 11.1* 10.6*  HCT 38.0* 35.0* 34.8* 33.3*  MCV 80.0 80.6 79.6* 78.0*  PLT 293 257 277 246   Basic Metabolic Panel: Recent Labs  Lab 07/09/23 1152 07/10/23 0452 07/11/23 0407  NA 132* 134* 132*  K 5.3* 4.5 4.1  CL 99 98 96*  CO2 27 28 25   GLUCOSE 288* 170* 200*  BUN 21 24* 24*  CREATININE 1.01 1.02 0.96  CALCIUM 9.5 9.3 9.1  MG  --   --  1.8   GFR: Estimated Creatinine Clearance: 70 mL/min (by C-G formula based on SCr of 0.96 mg/dL). Liver Function Tests: Recent Labs  Lab 07/09/23 1152  AST 21  ALT 14  ALKPHOS 83  BILITOT 0.9  PROT 7.2  ALBUMIN 3.5   Recent Labs  Lab 07/09/23 1152  LIPASE 23   No results for input(s): "AMMONIA" in the last 168 hours. Coagulation Profile: Recent Labs  Lab 07/09/23 1407 07/10/23 1717  INR 1.4* 1.4*    Cardiac Enzymes: No results for input(s): "CKTOTAL", "CKMB", "CKMBINDEX", "TROPONINI" in the last 168 hours. BNP (last 3 results) No results for input(s): "PROBNP" in the last 8760 hours. HbA1C: Recent Labs    07/10/23 0452  HGBA1C 8.0*   CBG: Recent Labs  Lab 07/10/23 1205 07/10/23 1723 07/10/23 2113 07/11/23 0843 07/11/23 1248  GLUCAP 156* 153* 180* 176* 183*   Lipid Profile: No results for input(s): "CHOL", "HDL", "LDLCALC", "TRIG", "CHOLHDL", "LDLDIRECT" in the last 72 hours. Thyroid Function Tests: No results for input(s): "TSH", "T4TOTAL", "FREET4", "T3FREE", "THYROIDAB" in the last 72 hours. Anemia Panel: No results for input(s): "VITAMINB12", "FOLATE", "FERRITIN", "TIBC", "IRON", "RETICCTPCT" in the last 72 hours. Urine analysis:    Component Value Date/Time   COLORURINE STRAW (A) 04/18/2023 1758   APPEARANCEUR CLEAR (A) 04/18/2023 1758   LABSPEC 1.005 04/18/2023 1758   PHURINE 5.0 04/18/2023 1758   GLUCOSEU >=500 (A) 04/18/2023 1758  HGBUR SMALL (A) 04/18/2023 1758   BILIRUBINUR NEGATIVE 04/18/2023 1758   KETONESUR NEGATIVE 04/18/2023 1758   PROTEINUR NEGATIVE 04/18/2023 1758   NITRITE NEGATIVE 04/18/2023 1758   LEUKOCYTESUR NEGATIVE 04/18/2023 1758   Sepsis Labs: @LABRCNTIP (procalcitonin:4,lacticidven:4)  )No results found for this or any previous visit (from the past 240 hours).       Radiology Studies: ECHOCARDIOGRAM COMPLETE Result Date: 07/11/2023    ECHOCARDIOGRAM REPORT   Patient Name:   KIERON KANTNER Date of Exam: 07/10/2023 Medical Rec #:  [id number]        Height:       68.0 in Accession #:    [id number]       Weight:       211.0 lb Date of Birth:  1946/04/22        BSA:          2.091 m Patient Age:    42 years         BP:           141/85 mmHg Patient Gender: M                HR:           73 bpm. Exam Location:  [facility] Procedure: 2D Echo, 3D Echo, Cardiac Doppler, Color Doppler and Strain Analysis            (Both Spectral and Color Flow  Doppler were utilized during            procedure). Indications:     NSTEMI, Dyspnea  History:         Patient has prior history of Echocardiogram examinations, most                  recent 11/14/2022. Acute MI, CAD and Previous Myocardial                  Infarction, PAD, Signs/Symptoms:Dyspnea; Risk                  Factors:Hypertension, Diabetes and Dyslipidemia. CKKD, ETOH                  abuse.  Sonographer:     Mikki Harbor Referring Phys:  [id number] AMY N COX Diagnosing Phys: Marcina Millard MD  Sonographer Comments: Global longitudinal strain was attempted. IMPRESSIONS  1. Left ventricular ejection fraction, by estimation, is 25 to 30%. The left ventricle has severely decreased function. The left ventricle demonstrates regional wall motion abnormalities (see scoring diagram/findings for description). The left ventricular internal cavity size was moderately dilated. Left ventricular diastolic parameters were normal. The global longitudinal strain is normal.  2. Right ventricular systolic function is normal. The right ventricular size is normal. There is normal pulmonary artery systolic pressure.  3. The mitral valve is normal in structure. Mild mitral valve regurgitation. No evidence of mitral stenosis.  4. Tricuspid valve regurgitation is mild to moderate.  5. The aortic valve is normal in structure. Aortic valve regurgitation is not visualized. Aortic valve sclerosis is present, with no evidence of aortic valve stenosis.  6. The inferior vena cava is normal in size with greater than 50% respiratory variability, suggesting right atrial pressure of 3 mmHg. FINDINGS  Left Ventricle: Left ventricular ejection fraction, by estimation, is 25 to 30%. The left ventricle has severely decreased function. The left ventricle demonstrates regional wall motion abnormalities. Strain was performed and the global longitudinal strain is normal. The left ventricular internal cavity size was moderately dilated.  There is  no left ventricular hypertrophy. Left ventricular diastolic parameters were normal.  LV Wall Scoring: The apical lateral segment, mid inferoseptal segment, apical septal segment, and apex are hypokinetic. Right Ventricle: The right ventricular size is normal. No increase in right ventricular wall thickness. Right ventricular systolic function is normal. There is normal pulmonary artery systolic pressure. The tricuspid regurgitant velocity is 2.63 m/s, and  with an assumed right atrial pressure of 8 mmHg, the estimated right ventricular systolic pressure is 35.7 mmHg. Left Atrium: Left atrial size was normal in size. Right Atrium: Right atrial size was normal in size. Pericardium: There is no evidence of pericardial effusion. Mitral Valve: The mitral valve is normal in structure. Mild mitral valve regurgitation. No evidence of mitral valve stenosis. MV peak gradient, 3.8 mmHg. The mean mitral valve gradient is 1.0 mmHg. Tricuspid Valve: The tricuspid valve is normal in structure. Tricuspid valve regurgitation is mild to moderate. No evidence of tricuspid stenosis. Aortic Valve: The aortic valve is normal in structure. Aortic valve regurgitation is not visualized. Aortic valve sclerosis is present, with no evidence of aortic valve stenosis. Aortic valve mean gradient measures 2.0 mmHg. Aortic valve peak gradient measures 3.9 mmHg. Aortic valve area, by VTI measures 2.44 cm. Pulmonic Valve: The pulmonic valve was normal in structure. Pulmonic valve regurgitation is not visualized. No evidence of pulmonic stenosis. Aorta: The aortic root is normal in size and structure. Venous: The inferior vena cava is normal in size with greater than 50% respiratory variability, suggesting right atrial pressure of 3 mmHg. IAS/Shunts: No atrial level shunt detected by color flow Doppler. Additional Comments: 3D was performed not requiring image post processing on an independent workstation and was normal.  LEFT VENTRICLE PLAX 2D  LVIDd:         4.90 cm      Diastology LVIDs:         4.30 cm      LV e' medial:    4.68 cm/s LV PW:         1.40 cm      LV E/e' medial:  17.2 LV IVS:        1.10 cm      LV e' lateral:   6.09 cm/s LVOT diam:     2.00 cm      LV E/e' lateral: 13.2 LV SV:         41 LV SV Index:   19 LVOT Area:     3.14 cm  LV Volumes (MOD) LV vol d, MOD A2C: 135.0 ml LV vol d, MOD A4C: 137.0 ml LV vol s, MOD A2C: 97.9 ml LV vol s, MOD A4C: 98.4 ml LV SV MOD A2C:     37.1 ml LV SV MOD A4C:     137.0 ml LV SV MOD BP:      39.5 ml RIGHT VENTRICLE RV Basal diam:  3.45 cm RV Mid diam:    3.40 cm RV S prime:     6.20 cm/s LEFT ATRIUM             Index        RIGHT ATRIUM           Index LA diam:        4.10 cm 1.96 cm/m   RA Area:     18.70 cm LA Vol (A2C):   79.9 ml 38.22 ml/m  RA Volume:   48.70 ml  23.29 ml/m LA Vol (A4C):   76.6 ml 36.64  ml/m LA Biplane Vol: 80.5 ml 38.50 ml/m  AORTIC VALVE                    PULMONIC VALVE AV Area (Vmax):    2.08 cm     PV Vmax:       0.47 m/s AV Area (Vmean):   2.10 cm     PV Peak grad:  0.9 mmHg AV Area (VTI):     2.44 cm AV Vmax:           98.70 cm/s AV Vmean:          62.500 cm/s AV VTI:            0.166 m AV Peak Grad:      3.9 mmHg AV Mean Grad:      2.0 mmHg LVOT Vmax:         65.20 cm/s LVOT Vmean:        41.800 cm/s LVOT VTI:          0.129 m LVOT/AV VTI ratio: 0.78  AORTA Ao Root diam: 3.90 cm Ao Asc diam:  3.40 cm MITRAL VALVE               TRICUSPID VALVE MV Area (PHT): 5.34 cm    TR Peak grad:   27.7 mmHg MV Area VTI:   1.73 cm    TR Vmax:        263.00 cm/s MV Peak grad:  3.8 mmHg MV Mean grad:  1.0 mmHg    SHUNTS MV Vmax:       0.98 m/s    Systemic VTI:  0.13 m MV Vmean:      51.8 cm/s   Systemic Diam: 2.00 cm MV Decel Time: 142 msec MV E velocity: 80.30 cm/s MV A velocity: 66.60 cm/s MV E/A ratio:  1.21 Marcina Millard MD Electronically signed by Marcina Millard MD Signature Date/Time: 07/11/2023/1:03:05 PM    Final    US ARTERIAL ABI (SCREENING LOWER  EXTREMITY) Result Date: 07/10/2023 CLINICAL DATA:  Peripheral arterial disease EXAM: NONINVASIVE PHYSIOLOGIC VASCULAR STUDY OF BILATERAL LOWER EXTREMITIES TECHNIQUE: Evaluation of both lower extremities were performed at rest, including calculation of ankle-brachial indices with single level Doppler, pressure and pulse volume recording. COMPARISON:  None available FINDINGS: Right ABI:  Not obtained, previous   below-knee amputation. Left ABI: 0.95 Right Lower Extremity:  Post below-knee amputation Left Lower Extremity:  Multiphasic arterial waveforms at the ankle. IMPRESSION: 1. Borderline left lower extremity arterial occlusive disease at rest. 2. Post right below-knee amputation. Electronically Signed   By: Corlis Leak M.D.   On: 07/10/2023 17:33        Scheduled Meds:  atorvastatin  80 mg Oral QHS   clopidogrel  75 mg Oral Daily   feeding supplement  237 mL Oral BID BM   folic acid  1 mg Oral Daily   furosemide  60 mg Intravenous BID   [START ON 07/12/2023] influenza vaccine adjuvanted  0.5 mL Intramuscular Tomorrow-1000   insulin aspart  0-5 Units Subcutaneous QHS   insulin aspart  0-9 Units Subcutaneous TID WC   multivitamin with minerals  1 tablet Oral Daily   sacubitril-valsartan  1 tablet Oral Q12H   thiamine  100 mg Oral Daily   Or   thiamine  100 mg Intravenous Daily   traZODone  100 mg Oral QHS   Continuous Infusions:  magnesium sulfate bolus IVPB       LOS: 2 days  Silvano Bilis, MD [facility]   If 7PM-7AM, please contact night-coverage [email or web address] Password [email or web address] 07/11/2023, 2:15 PM

## 2023-07-11 NOTE — Telephone Encounter (Signed)
 Patient Product/process development scientist completed.    The patient is insured through [facility]. Patient has Medicare and is not eligible for a copay card, but may be able to apply for patient assistance or Medicare RX Payment Plan (Patient Must reach out to their plan, if eligible for payment plan), if available.    Ran test claim for Farxiga 10 mg and the current 30 day co-pay is $47.00.  Ran test claim for Jardiance 10 mg and the current 30 day co-pay is $47.00.  This test claim was processed through [facility]- copay amounts may vary at other pharmacies due to pharmacy/plan contracts, or as the patient moves through the different stages of their insurance plan.     Roland Earl, CPHT Pharmacy Technician III Certified Patient Advocate [facility] Pharm[facility] Patient Advocate Team Direct Number: [phone]  Fax: [phone]

## 2023-07-11 NOTE — Progress Notes (Signed)
 Heart Failure Nurse Navigator Progress Note  PCP: Gracelyn Nurse, MD PCP-Cardiologist: None-Per palliative Medicine Team Note-Pt would like evaluation & initiation of care by [facility] Cardiology Team. Admission Diagnosis: Chest pain, unspecified type Admitted from: Home via personal vehicle  Presentation:   Manuel [name] presented with mid sternal non-radiating chest pain and pressure over the past 2 hours.  Did complain of feeling like previous MI's. Shortness of breath for a year that has worsened when lying down also c/o poor  sleep for 1 year. Patient has also had abdominal distention and loss of appetite. Decreased urine output was also noted. Patient wears 2L O2 [place] chronically. Swelling in his leg leg only (Right BKA). BNP-2,395.3. Troponin 895 & 944.  ECHO/ LVEF: 25-30% -11/14/2022  Clinical Course:  Past Medical History:  Diagnosis Date   Acute ST elevation myocardial infarction (STEMI) of inferior wall (HCC) 04/19/2010   a.) transfered from [facility] to [facility] --> LHC/PCI (very difficult procedure) --> 3.0 x 23 mm and 3.0 x 12 mm Xience stents to RCA   Allergies    Arthritis    Benign essential hypertension    Bilateral carotid artery disease (HCC) 05/08/2021   a.) carotid doppler 05/08/2021: 1-39% BICA   CAD (coronary artery disease) 04/19/2010   a.) inferior STEMI 04/19/2010 --> LHC/PCI: 50-70% pD1, 80% pRI, 90/90/90% RCA (overlapping 3.0 x 23 and 3.0 x 12 mm Xience DES); b.) MV 11/10/2018: fixed minimally reversible inferior perfusion defect   Cellulitis of foot    DDD (degenerative disc disease), cervical    Diabetes mellitus type 2, insulin dependent (HCC)    Diverticulosis    Full dentures    Gout    Hard of hearing    History of bilateral cataract extraction 2022   History of ETOH abuse    Hyperlipidemia    Ischemic cardiomyopathy 04/19/2010   a.) TTE 04/19/2010: 40%; b.) TTE 04/20/2014: EF >55%, mild RVE, triv PR, mild MR/TR, G1DD; c.) TTE 11/10/2018: EF 45%, inf  HK, mild RVE, triv TR/PR, mild MR, G1DD; d.) TTE 11/14/2022: EF 25-30%, basal anteroseptal, apical lateral, apical septal, and apex AK, mid anterolateral HK, mild-mod MR/TR   Long term current use of aspirin    Long term current use of clopidogrel    Lumbar degenerative disc disease    Lumbar radiculopathy    Lumbar vertebral fracture (chronic superior endplate of L1)    NSTEMI (non-ST elevated myocardial infarction) (HCC) 01/15/2023   OSA (obstructive sleep apnea)    a.) unable to tolerate nocturnal PAP therapy   Peripheral artery disease (HCC)    a.) stenting 05/14/21: 12 mm x 12 cm LifeStent RIGHT dis SFA/prox pop; b.) s/p cath directed thrombolysis RIGHT SFA/pop 06/14/21; c.) s/p mech thrombectomy + stenting 06/15/21: 8 mm x 25cm & 8 mm x 7.5cm Viabahn; d.) s/p BILAT CFA, profunda femoris, SFA endarterectomies + fogarty embolectomy + stenting 11/15/22: 12mm x 58mm Lifestream BILAT CIAs, 14 mm x 6 cm Lifestream & 13 mm x 5 cm Viabahn LEFT EIA   Peripheral neuropathy    Umbilical hernia      Social History   Socioeconomic History   Marital status: Married    Spouse name: [name]   Number of children: 3   Years of education: Not on file   Highest education level: Not on file  Occupational History   Not on file  Tobacco Use   Smoking status: Never   Smokeless tobacco: Never  Vaping Use   Vaping status:  Never Used  Substance and Sexual Activity   Alcohol use: Yes    Alcohol/week: 21.0 standard drinks of alcohol    Types: 7 Glasses of wine, 14 Cans of beer per week    Comment: occassional   Drug use: No   Sexual activity: Not on file  Other Topics Concern   Not on file  Social History Narrative   Not on file   Social Drivers of Health   Financial Resource Strain: Low Risk  (06/05/2023)   Received from [facility]   Overall Financial Resource Strain (CARDIA)    Difficulty of Paying Living Expenses: Not hard at all  Food Insecurity: No Food Insecurity  (07/10/2023)   Hunger Vital Sign    Worried About Running Out of Food in the Last Year: Never true    Ran Out of Food in the Last Year: Never true  Transportation Needs: No Transportation Needs (07/10/2023)   PRAPARE - Administrator, Civil Service (Medical): No    Lack of Transportation (Non-Medical): No  Physical Activity: Not on file  Stress: Not on file  Social Connections: Unknown (07/10/2023)   Social Connection and Isolation Panel [NHANES]    Frequency of Communication with Friends and Family: More than three times a week    Frequency of Social Gatherings with Friends and Family: More than three times a week    Attends Religious Services: Patient declined    Database administrator or Organizations: No    Attends Engineer, structural: Patient declined    Marital Status: Married  Water engineer and Provision:  Detailed education and instructions provided on heart failure disease management including the following:  Signs and symptoms of Heart Failure When to call the physician Importance of daily weights Low sodium diet Fluid restriction Medication management Anticipated future follow-up appointments  Patient education given on each of the above topics.  Patient acknowledges understanding via teach back method and acceptance of all instructions.  Education Materials:  "Living Better With Heart Failure" Booklet, HF zone tool, & Daily Weight Tracker Tool.  Patient has scale at home: Yes Patient has pill box at home: Yes    High Risk Criteria for Readmission and/or Poor Patient Outcomes: Heart failure hospital admissions (last 6 months): 0  No Show rate: 21% Difficult social situation: Right BKA-waiting on prosthetic  Demonstrates medication adherence: Yes Primary Language: English Literacy level: Reading, Writing % Comprehension  Barriers of Care:   Right BKA  Considerations/Referrals:   Referral made to Heart Failure Pharmacist Stewardship:  Yes Referral made to Heart Failure CSW/NCM TOC: No Referral made to Heart & Vascular TOC clinic: Yes-07/21/23 @ 11:30 AM   Items for Follow-up on DC/TOC: Daily weights-Encouraged to start doing daily weights with assistance because of Right BKA while awaiting prosthesis. Diet & Fluid Restrictions-  Patient has been drinking a lot of Gatorade and large amounts of fluid. ETOH Use Palliative Consult while he was hospitalized Continued Heart Failure Education  Roxy Horseman, RN, BSN [facility] Heart Failure Navigator Secure Chat Only

## 2023-07-12 DIAGNOSIS — F4321 Adjustment disorder with depressed mood: Secondary | ICD-10-CM | POA: Diagnosis not present

## 2023-07-12 DIAGNOSIS — I5021 Acute systolic (congestive) heart failure: Secondary | ICD-10-CM

## 2023-07-12 DIAGNOSIS — I739 Peripheral vascular disease, unspecified: Secondary | ICD-10-CM | POA: Diagnosis not present

## 2023-07-12 DIAGNOSIS — I214 Non-ST elevation (NSTEMI) myocardial infarction: Secondary | ICD-10-CM | POA: Diagnosis not present

## 2023-07-12 DIAGNOSIS — Z515 Encounter for palliative care: Secondary | ICD-10-CM | POA: Diagnosis not present

## 2023-07-12 LAB — GLUCOSE, CAPILLARY
Glucose-Capillary: 132 mg/dL — ABNORMAL HIGH (ref 70–99)
Glucose-Capillary: 180 mg/dL — ABNORMAL HIGH (ref 70–99)
Glucose-Capillary: 196 mg/dL — ABNORMAL HIGH (ref 70–99)
Glucose-Capillary: 224 mg/dL — ABNORMAL HIGH (ref 70–99)

## 2023-07-12 LAB — BASIC METABOLIC PANEL
Anion gap: 7 (ref 5–15)
BUN: 21 mg/dL (ref 8–23)
CO2: 27 mmol/L (ref 22–32)
Calcium: 9.2 mg/dL (ref 8.9–10.3)
Chloride: 98 mmol/L (ref 98–111)
Creatinine, Ser: 0.87 mg/dL (ref 0.61–1.24)
GFR, Estimated: >60 mL/min (ref >60)
Glucose, Bld: 123 mg/dL — ABNORMAL HIGH (ref 70–99)
Potassium: 3.7 mmol/L (ref 3.5–5.1)
Sodium: 132 mmol/L — ABNORMAL LOW (ref 135–145)

## 2023-07-12 LAB — MAGNESIUM: Magnesium: 2 mg/dL (ref 1.7–2.4)

## 2023-07-12 MED ORDER — LORATADINE 10 MG PO TABS
10.0000 mg | ORAL_TABLET | Freq: Every day | ORAL | Status: DC
  Administered 2023-07-12 – 2023-07-16 (×5): 10 mg via ORAL
  Filled 2023-07-12 (×5): qty 1

## 2023-07-12 MED ORDER — SALINE SPRAY 0.65 % NA SOLN
1.0000 | NASAL | Status: DC | PRN
  Administered 2023-07-13 – 2023-07-15 (×3): 1 via NASAL
  Filled 2023-07-12: qty 44

## 2023-07-12 MED ORDER — CARVEDILOL 12.5 MG PO TABS
25.0000 mg | ORAL_TABLET | Freq: Two times a day (BID) | ORAL | Status: DC
  Administered 2023-07-12 – 2023-07-16 (×8): 25 mg via ORAL
  Filled 2023-07-12 (×4): qty 2
  Filled 2023-07-12 (×4): qty 1

## 2023-07-12 NOTE — Progress Notes (Addendum)
 PROGRESS NOTE    Manuel EARLY  [name]:[id number] DOB: 1945/06/24 DOA: 07/09/2023 PCP: Gracelyn Nurse, MD  Outpatient Specialists: cardiology    Brief Narrative:   From admission h and p  [name] is a 78 year old male with history of CAD, PAD, history of right BKA, hyperlipidemia, hypertension, depression, anxiety, insomnia, who presents to the emergency department for chief concerns of shortness of breath, decreased urine output, and inability to sleep for the last year.   He reports he has been short of breath for about 1 year and the shortness of breath is worse when he lays down at night.  He reports this has caused him difficulty sleeping for 1 year.  He reports that he does get so tired that he is so short of breath when he lays down that he cannot go to sleep.   He reports that shortness of breath has worsened over the last week.  He reports there is also swelling of his lower extremity (left only).  He denies fever, chills, nausea, vomiting, cough, syncope.  He endorses chest pressure/chest discomfort especially when he lays flat at night.  He denies trauma to his person.  Assessment & Plan:   Principal Problem:   NSTEMI (non-ST elevated myocardial infarction) (HCC) Active Problems:   PAD (peripheral artery disease) (HCC)   Generalized weakness   Alcohol abuse   Coronary artery disease with history of myocardial infarction without history of CABG   Insulin dependent type 2 diabetes mellitus (HCC)   Chronic combined systolic and diastolic CHF (congestive heart failure) (HCC)   Chronic hyponatremia - baseline Na 128-130.   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 With chf exacerbation, RWMA on TTE. History stents - s/p IV heparin  -ASA/plavix  - L/RHC when patient agreeable  - home atorvastatin   # HFrEF with acute exacerbation 2/2 ischemic cardiomyopathy. EF 25%. Net negative 1.8L. Down 8lbs since 3/19.  Remains volume overloaded.  - continue IV diuresis   - Continue entreso, coreg, aldactone, R/LHC when agreeable   #mild hypona Likely 2/2 hypervolemia  Continue to monitor with IV diuresis   # End-of-life care Patient reports declining health last 1-2 years, is somewhat interested in a comfort-based approach - palliative consulted  # PAD S/p recent right BKA that is healed. ABI of the LLE shows borderline PAD - outpt vascular f/u - home statin, plavix  # EtOH Reports drinking most days, vague on amount, says last drink a week ago, denies hx withdrawal - monitor  # Insomnia Improved w/ diuresis likely component of  orthopnea/pnd likely contributory - home trazodone, mirtazapine  # T2DM A1c 8.0  CBG (last 3)  Recent Labs    07/11/23 2140 07/12/23 0805 07/12/23 1220  GLUCAP 118* 132* 180*   Glucose appropriate. - SSI - home 70/30, is on 30u bid at home, Continue at 15u bid  Microcytic anemia  No gross bleeding noted.     Latest Ref Rng & Units 07/11/2023    4:07 AM 07/10/2023    5:17 PM 07/10/2023    4:52 AM  CBC  WBC 4.0 - 10.5 K/uL 6.5  9.2  7.9   Hemoglobin 13.0 - 17.0 g/dL 29.5  62.1  30.8   Hematocrit 39.0 - 52.0 % 33.3  34.8  35.0   Platelets 150 - 400 K/uL 246  277  257    Will check iron panel.   DVT prophylaxis: therapeutic heparin Code Status: dnr/dni Family Communication: none at bedside.    Level of care: Progressive Status is: Inpatient Remains inpatient appropriate because: severity of illness    Consultants:  Cardiology, palliative  Procedures: None thus far  Antimicrobials:  none    Subjective: No chest pain, feels dyspnea/orthopnea is  improving.   Objective: Vitals:   07/11/23 2000 07/12/23 0455 07/12/23 0540 07/12/23 0806  BP: (!) 150/82 (!) 143/87  (!) 156/99  Pulse: 70 83  87  Resp: 19 18  18   Temp: 98.1 F (36.7 C) 98.1 F (36.7 C)  98.6 F (37 C)  TempSrc: Oral Oral    SpO2: 98% 93%  92%  Weight:   92.2 kg   Height:        Intake/Output Summary (Last 24 hours) at 07/12/2023 1630 Last data filed at 07/12/2023 1215 Gross per 24 hour  Intake 100 ml  Output 2600 ml  Net -2500 ml   Filed Weights   07/09/23 1330 07/12/23 0540  Weight: 95.7 kg 92.2 kg    Examination:  Constitutional: In no distress.  Cardiovascular: Normal rate, regular rhythm. 2+ edema of LLE, trace to 1+ edema of RLE up to hip  Pulmonary: Non labored breathing on room air, no wheezing, faint rales at bases   Abdominal: Soft. Normal bowel sounds. Non distended and non tender Musculoskeletal: Normal range of motion.   S/p R BKA   Neurological: Alert and oriented to person, place, and time. Non focal  Skin: Skin is warm and dry.   Data Reviewed: I have personally reviewed following labs and imaging studies  CBC: Recent Labs  Lab 07/09/23 1152 07/10/23 0452 07/10/23 1717 07/11/23 0407  WBC 8.7 7.9 9.2 6.5  HGB 11.8* 10.7* 11.1* 10.6*  HCT 38.0* 35.0* 34.8* 33.3*  MCV 80.0 80.6 79.6* 78.0*  PLT 293 257 277 246   Basic Metabolic Panel: Recent Labs  Lab 07/09/23 1152 07/10/23 0452 07/11/23 0407 07/12/23 0633  NA 132* 134* 132* 132*  K 5.3* 4.5 4.1 3.7  CL 99 98 96* 98  CO2 27 28 25 27   GLUCOSE 288* 170* 200* 123*  BUN 21 24* 24* 21  CREATININE 1.01 1.02 0.96 0.87  CALCIUM 9.5 9.3 9.1 9.2  MG  --   --  1.8 2.0   GFR: Estimated Creatinine Clearance: 75.9 mL/min (by C-G formula based on SCr of 0.87 mg/dL). Liver Function Tests: Recent Labs  Lab 07/09/23 1152  AST 21  ALT 14  ALKPHOS 83  BILITOT 0.9  PROT 7.2  ALBUMIN 3.5   Recent Labs  Lab 07/09/23 1152  LIPASE 23   No results for input(s): "AMMONIA" in  the last 168 hours. Coagulation Profile: Recent Labs  Lab 07/09/23 1407 07/10/23 1717  INR 1.4* 1.4*   Cardiac Enzymes: No results for input(s): "CKTOTAL", "CKMB", "CKMBINDEX", "TROPONINI" in the last 168 hours. BNP (last 3 results) No results for input(s): "PROBNP" in the last 8760 hours. HbA1C: Recent Labs    07/10/23 0452  HGBA1C 8.0*   CBG: Recent Labs  Lab 07/11/23 1248 07/11/23 1644 07/11/23 2140 07/12/23 0805 07/12/23 1220  GLUCAP 183* 183* 118* 132* 180*   Lipid Profile: No results for  input(s): "CHOL", "HDL", "LDLCALC", "TRIG", "CHOLHDL", "LDLDIRECT" in the last 72 hours. Thyroid Function Tests: No results for input(s): "TSH", "T4TOTAL", "FREET4", "T3FREE", "THYROIDAB" in the last 72 hours. Anemia Panel: No results for input(s): "VITAMINB12", "FOLATE", "FERRITIN", "TIBC", "IRON", "RETICCTPCT" in the last 72 hours. Urine analysis:    Component Value Date/Time   COLORURINE STRAW (A) 04/18/2023 1758   APPEARANCEUR CLEAR (A) 04/18/2023 1758   LABSPEC 1.005 04/18/2023 1758   PHURINE 5.0 04/18/2023 1758   GLUCOSEU >=500 (A) 04/18/2023 1758   HGBUR SMALL (A) 04/18/2023 1758   BILIRUBINUR NEGATIVE 04/18/2023 1758   KETONESUR NEGATIVE 04/18/2023 1758   PROTEINUR NEGATIVE 04/18/2023 1758   NITRITE NEGATIVE 04/18/2023 1758   LEUKOCYTESUR NEGATIVE 04/18/2023 1758   Sepsis Labs: @LABRCNTIP (procalcitonin:4,lacticidven:4)  )No results found for this or any previous visit (from the past 240 hours).       Radiology Studies: US ARTERIAL ABI (SCREENING LOWER EXTREMITY) Result Date: 07/10/2023 CLINICAL DATA:  Peripheral arterial disease EXAM: NONINVASIVE PHYSIOLOGIC VASCULAR STUDY OF BILATERAL LOWER EXTREMITIES TECHNIQUE: Evaluation of both lower extremities were performed at rest, including calculation of ankle-brachial indices with single level Doppler, pressure and pulse volume recording. COMPARISON:  None available FINDINGS: Right ABI:  Not obtained, previous    below-knee amputation. Left ABI: 0.95 Right Lower Extremity:  Post below-knee amputation Left Lower Extremity:  Multiphasic arterial waveforms at the ankle. IMPRESSION: 1. Borderline left lower extremity arterial occlusive disease at rest. 2. Post right below-knee amputation. Electronically Signed   By: Corlis Leak M.D.   On: 07/10/2023 17:33        Scheduled Meds:  atorvastatin  80 mg Oral QHS   carvedilol  25 mg Oral BID WC   clopidogrel  75 mg Oral Daily   enoxaparin (LOVENOX) injection  45 mg Subcutaneous Q24H   feeding supplement  237 mL Oral BID BM   folic acid  1 mg Oral Daily   furosemide  60 mg Intravenous BID   influenza vaccine adjuvanted  0.5 mL Intramuscular Tomorrow-1000   insulin aspart  0-5 Units Subcutaneous QHS   insulin aspart  0-9 Units Subcutaneous TID WC   insulin aspart protamine- aspart  15 Units Subcutaneous BID WC   loratadine  10 mg Oral Daily   multivitamin with minerals  1 tablet Oral Daily   sacubitril-valsartan  1 tablet Oral Q12H   spironolactone  12.5 mg Oral Daily   thiamine  100 mg Oral Daily   Or   thiamine  100 mg Intravenous Daily   traZODone  100 mg Oral QHS   Continuous Infusions:     LOS: 3 days     Marolyn Haller, MD [facility]   If 7PM-7AM, please contact night-coverage [email or web address] Password [email or web address] 07/12/2023, 4:30 PM

## 2023-07-12 NOTE — Progress Notes (Signed)
   Patient Name: Manuel [name] Date of Encounter: 07/12/2023 [facility] Cardiologist: None   Interval Summary  .    -1.9L. Patient says he is feeling a lot better. Still volume up on exam. No chest pain reported.   Vital Signs .    Vitals:   07/11/23 1704 07/11/23 2000 07/12/23 0455 07/12/23 0540  BP: (!) 155/74 (!) 150/82 (!) 143/87   Pulse:  70 83   Resp: 18 19 18    Temp: 98.7 F (37.1 C) 98.1 F (36.7 C) 98.1 F (36.7 C)   TempSrc: Oral Oral Oral   SpO2: 97% 98% 93%   Weight:    92.2 kg  Height:        Intake/Output Summary (Last 24 hours) at 07/12/2023 0737 Last data filed at 07/11/2023 2140 Gross per 24 hour  Intake 100 ml  Output 1975 ml  Net -1875 ml      07/12/2023    5:40 AM 07/09/2023    1:30 PM 06/20/2023    9:46 AM  Last 3 Weights  Weight (lbs) 203 lb 4.2 oz 211 lb 184 lb  Weight (kg) 92.2 kg 95.709 kg 83.462 kg      Telemetry/ECG    NSR 70s, PVCs - Personally Reviewed  Physical Exam .   GEN: No acute distress.   Neck: + JVD Cardiac: RRR, no murmurs, rubs, or gallops.  Respiratory: Clear to auscultation bilaterally. GI: +distended  MS: 1+ lower leg edema; R BKA  Assessment & Plan .     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 - presented to the ER with chest pain and SOB - CXR showed vascular congestion and BNP 2395 - Echo showed LVEF 20-25%, similar to Echo in July - Echo in 2020 LVEF 40-45% - ischemic evaluation in 2020 with MPI which showed inferior infarct w/ peri-infarct ischemia - IV lasix 60mg  BID - Net -1.7L - kidney function stable - still volume up on eam - continue with diuresis  NSTEMI CAD  - s/p DES x2 to the RCA in 2011 with residual D1 and ramus dzs at that time - MPI in 2020 showed inferior infarct/peri-infarct ischemia - presented with chest pain and SOB - HS troponin [phone] stated on IV heparin - will likely require heart cath, possible Monday - continue statin, plavix and BB  HTN - Bps mildly elevated -  Coreg 12.5mg  BID>will increase to 25mg  BID - Entresto 24-26mg  BID - spironolactone 12.5mg  daily  HL - continue Lipitor 80mg  daily  OSA - sleep study scheduled for April 2024  For questions or updates, please contact [facility] Please consult [email or web address] for contact info under        Signed, Dynver Clemson David Stall, PA-C

## 2023-07-12 NOTE — Evaluation (Signed)
 Physical Therapy Evaluation Patient Details Name: Manuel [name] MRN: [id number] DOB: 05-14-1945 Today's Date: 07/12/2023  History of Present Illness  Pt is a 78 y.o. male presenting to hosptial with weakness, fatigue, SOB, difficulty sleeping - admitted with likely NSTEMI.  h/o R BKA 04/20/23  PMH includes OA, CAD, htn, CAD, DDD, DM, gout, ischemic cardiomyopathy, OSA, peripheral neuropathy.  Clinical Impression  Pt pleasant and motivated to do some mobility with PT/get up to recliner.  He did well with bed mobility and did not need assist to attain standing from standard height bed, however he did fatigue relatively quickly with a few hopping steps in the walker (<10 ft), needed recliner to sit/rest after modest distance ambulation.  Pt showed good safety, will need continued PT her and at home to address functional and activity tolerance limitations.        If plan is discharge home, recommend the following: A little help with walking and/or transfers;A lot of help with bathing/dressing/bathroom;Assist for transportation;Help with stairs or ramp for entrance   Can travel by private vehicle        Equipment Recommendations None recommended by PT  Recommendations for Other Services       Functional Status Assessment Patient has had a recent decline in their functional status and demonstrates the ability to make significant improvements in function in a reasonable and predictable amount of time.     Precautions / Restrictions Precautions Precautions: Fall Restrictions Weight Bearing Restrictions Per Provider Order: No      Mobility  Bed Mobility Overal bed mobility: Modified Independent                  Transfers Overall transfer level: Modified independent Equipment used: Rolling walker (2 wheels)               General transfer comment: from standard height bed, minimal set up/cuing he was able to rise to standing w/o direct assist, heavy UE reliance on AD     Ambulation/Gait Ambulation/Gait assistance: Contact guard assist Gait Distance (Feet): 6 Feet Assistive device: Rolling walker (2 wheels)         General Gait Details: Pt able to take a few hop-to steps in walker (reports being ~1 month from getting prosthetic) but fatigued quickly and did not feel able to really push himself more this AM.  Safe and controlled for the minimal effort.  Stairs            Wheelchair Mobility     Tilt Bed    Modified Rankin (Stroke Patients Only)       Balance Overall balance assessment: Modified Independent                                           Pertinent Vitals/Pain Pain Assessment Pain Assessment:  (minimal baseline aches, etc w/o acute pain)    Home Living Family/patient expects to be discharged to:: Private residence Living Arrangements: Spouse/significant other Available Help at Discharge: Family;Available 24 hours/day Type of Home: House Home Access: Ramped entrance     Alternate Level Stairs-Number of Steps: flight Home Layout: Able to live on main level with bedroom/bathroom;Two level;Laundry or work area in [employer] Equipment: Agricultural consultant (2 wheels);BSC/3in1;Wheelchair - manual;Cane - single point;Shower seat      Prior Function Prior Level of Function : Independent/Modified Independent  Mobility Comments: Pt reports he transfers confidently to/from w/c but has not been able to do a lot of walking/hopping since BKA ADLs Comments: spouse assists as needed     Extremity/Trunk Assessment   Upper Extremity Assessment Upper Extremity Assessment: Overall WFL for tasks assessed;Generalized weakness (poor tolerance/endurance with prolonged standing/WBing through walker)    Lower Extremity Assessment Lower Extremity Assessment: Generalized weakness       Communication   Communication Communication: No apparent difficulties    Cognition Arousal: Alert Behavior During  Therapy: WFL for tasks assessed/performed   PT - Cognitive impairments: No apparent impairments                         Following commands: Intact       Cueing       General Comments General comments (skin integrity, edema, etc.): Pt was able to move in bed and rise to standing relatively well, limited tolerance with prolonged WBing/standing tasks    Exercises     Assessment/Plan    PT Assessment Patient needs continued PT services  PT Problem List Decreased activity tolerance;Decreased balance;Decreased strength;Decreased knowledge of use of DME;Decreased safety awareness;Cardiopulmonary status limiting activity       PT Treatment Interventions Gait training;DME instruction;Functional mobility training;Therapeutic activities;Therapeutic exercise;Balance training;Neuromuscular re-education;Patient/family education    PT Goals (Current goals can be found in the Care Plan section)  Acute Rehab PT Goals Patient Stated Goal: Go home ASAP PT Goal Formulation: With patient Time For Goal Achievement: 07/25/23 Potential to Achieve Goals: Good    Frequency Min 2X/week     Co-evaluation               AM-PAC PT "6 Clicks" Mobility  Outcome Measure Help needed turning from your back to your side while in a flat bed without using bedrails?: None Help needed moving from lying on your back to sitting on the side of a flat bed without using bedrails?: None Help needed moving to and from a bed to a chair (including a wheelchair)?: A Little Help needed standing up from a chair using your arms (e.g., wheelchair or bedside chair)?: A Little Help needed to walk in hospital room?: A Little Help needed climbing 3-5 steps with a railing? : Total 6 Click Score: 18    End of Session Equipment Utilized During Treatment: Gait belt Activity Tolerance: Patient tolerated treatment well;Patient limited by fatigue Patient left: with chair alarm set;with call bell/phone within  reach;with family/visitor present   PT Visit Diagnosis: Muscle weakness (generalized) (M62.81);Difficulty in walking, not elsewhere classified (R26.2);Unsteadiness on feet (R26.81)    Time: 1610-9604 PT Time Calculation (min) (ACUTE ONLY): 21 min   Charges:   PT Evaluation $PT Eval Low Complexity: 1 Low PT Treatments $Therapeutic Activity: 8-22 mins PT General Charges $$ ACUTE PT VISIT: 1 Visit         Malachi Pro, DPT 07/12/2023, 10:10 AM

## 2023-07-12 NOTE — Progress Notes (Signed)
 Daily Progress Note   Patient Name: Manuel [name]       Date: 07/12/2023 DOB: 1945/05/22  Age: 78 y.o. MRN#: [id number] Attending Physician: Marolyn Haller, MD Primary Care Physician: Gracelyn Nurse, MD Admit Date: 07/09/2023  Reason for Consultation/Follow-up: Establishing goals of care  HPI/Brief Hospital Review: [name] is a 78 year old male with history of CAD, PAD, history of right BKA, hyperlipidemia, hypertension, depression, anxiety, insomnia, who presents to the emergency department for chief concerns of shortness of breath, decreased urine output, and inability to sleep for the last year.    Palliative Medicine consulted for assisting with goals of care conversations.  Subjective: Extensive chart review has been completed prior to meeting patient including labs, vital signs, imaging, progress notes, orders, and available advanced directive documents from current and previous encounters.    Visited with Manuel [name] and his wife-[name] at his bedside. Manuel [name] shares he is feeling well today, no acute complaints, slept well again last night, appetite remains stable.  He speaks to recommendations again for RHC. He remains unsure if he wishes to proceed but seems to leaning towards declining. Encouraged Manuel [name] to have ongoing and close follow-up with cardiology team and continue having collaborative conversations regarding plan of care.  As requested by Manuel [name], reviewed medication lists she references at home. She and Mr [name] shares their concern regarding medication mismanagement as with each hospitalization and each provider visit medications are adjusted and changed. Medication list provided not in alignment with current medications ordered or PTA list. We  discussed the importance of reviewing all medications prior to discharge and updating home medication list. We also discussed the importance of following closely with PCP-he is hopeful to find a new PCP as he is not pleased with his current provider. He also shares he is eager to have his first visit with behavioral health in early April.  Manuel [name] continues to consider his options as far as long term goals and plans.  Answered and addressed all questions and concerns. PMT to continue to follow for ongoing needs and concerns.  Thank you for allowing the Palliative Medicine Team to assist in the care of this patient.  Total time:  50 minutes  Time spent includes: Detailed review of medical records (labs, imaging, vital signs), medically appropriate exam (mental status,  respiratory, cardiac, skin), discussed with treatment team, counseling and educating patient, family and staff, documenting clinical information, medication management and coordination of care.  Leeanne Deed, DNP, AGNP-C Palliative Medicine   Please contact Palliative Medicine Team phone at [phone] for questions and concerns.

## 2023-07-12 NOTE — Plan of Care (Signed)
  Problem: Education: Goal: Ability to describe self-care measures that may prevent or decrease complications (Diabetes Survival Skills Education) will improve Outcome: Progressing Goal: Individualized Educational Video(s) Outcome: Progressing   Problem: Coping: Goal: Ability to adjust to condition or change in health will improve Outcome: Progressing   Problem: Fluid Volume: Goal: Ability to maintain a balanced intake and output will improve Outcome: Progressing   Problem: Health Behavior/Discharge Planning: Goal: Ability to identify and utilize available resources and services will improve Outcome: Progressing Goal: Ability to manage health-related needs will improve Outcome: Progressing   Problem: Metabolic: Goal: Ability to maintain appropriate glucose levels will improve Outcome: Progressing   Problem: Nutritional: Goal: Maintenance of adequate nutrition will improve Outcome: Progressing Goal: Progress toward achieving an optimal weight will improve Outcome: Progressing   Problem: Skin Integrity: Goal: Risk for impaired skin integrity will decrease Outcome: Progressing   Problem: Tissue Perfusion: Goal: Adequacy of tissue perfusion will improve Outcome: Progressing   Problem: Education: Goal: Knowledge of General Education information will improve Description: Including pain rating scale, medication(s)/side effects and non-pharmacologic comfort measures Outcome: Progressing   Problem: Health Behavior/Discharge Planning: Goal: Ability to manage health-related needs will improve Outcome: Progressing   Problem: Clinical Measurements: Goal: Ability to maintain clinical measurements within normal limits will improve Outcome: Progressing   Problem: Activity: Goal: Risk for activity intolerance will decrease Outcome: Progressing   Problem: Nutrition: Goal: Adequate nutrition will be maintained Outcome: Progressing   Problem: Coping: Goal: Level of anxiety  will decrease Outcome: Progressing   Problem: Elimination: Goal: Will not experience complications related to bowel motility Outcome: Progressing Goal: Will not experience complications related to urinary retention Outcome: Progressing   Problem: Safety: Goal: Ability to remain free from injury will improve Outcome: Progressing   Problem: Skin Integrity: Goal: Risk for impaired skin integrity will decrease Outcome: Progressing

## 2023-07-13 DIAGNOSIS — Z515 Encounter for palliative care: Secondary | ICD-10-CM | POA: Diagnosis not present

## 2023-07-13 DIAGNOSIS — I739 Peripheral vascular disease, unspecified: Secondary | ICD-10-CM | POA: Diagnosis not present

## 2023-07-13 DIAGNOSIS — I5021 Acute systolic (congestive) heart failure: Secondary | ICD-10-CM | POA: Diagnosis not present

## 2023-07-13 DIAGNOSIS — I214 Non-ST elevation (NSTEMI) myocardial infarction: Secondary | ICD-10-CM | POA: Diagnosis not present

## 2023-07-13 LAB — IRON AND TIBC
Iron: 31 ug/dL — ABNORMAL LOW (ref 45–182)
Saturation Ratios: 8 % — ABNORMAL LOW (ref 17.9–39.5)
TIBC: 381 ug/dL (ref 250–450)
UIBC: 350 ug/dL

## 2023-07-13 LAB — BASIC METABOLIC PANEL
Anion gap: 4 — ABNORMAL LOW (ref 5–15)
BUN: 25 mg/dL — ABNORMAL HIGH (ref 8–23)
CO2: 30 mmol/L (ref 22–32)
Calcium: 8.7 mg/dL — ABNORMAL LOW (ref 8.9–10.3)
Chloride: 98 mmol/L (ref 98–111)
Creatinine, Ser: 0.9 mg/dL (ref 0.61–1.24)
GFR, Estimated: >60 mL/min (ref >60)
Glucose, Bld: 97 mg/dL (ref 70–99)
Potassium: 3.9 mmol/L (ref 3.5–5.1)
Sodium: 132 mmol/L — ABNORMAL LOW (ref 135–145)

## 2023-07-13 LAB — CBC
HCT: 31.8 % — ABNORMAL LOW (ref 39.0–52.0)
Hemoglobin: 10.3 g/dL — ABNORMAL LOW (ref 13.0–17.0)
MCH: 24.7 pg — ABNORMAL LOW (ref 26.0–34.0)
MCHC: 32.4 g/dL (ref 30.0–36.0)
MCV: 76.3 fL — ABNORMAL LOW (ref 80.0–100.0)
Platelets: 223 10*3/uL (ref 150–400)
RBC: 4.17 MIL/uL — ABNORMAL LOW (ref 4.22–5.81)
RDW: 16.5 % — ABNORMAL HIGH (ref 11.5–15.5)
WBC: 6.6 10*3/uL (ref 4.0–10.5)
nRBC: 0 % (ref 0.0–0.2)

## 2023-07-13 LAB — LIPID PANEL
Cholesterol: 59 mg/dL (ref 0–200)
HDL: 32 mg/dL — ABNORMAL LOW (ref >40)
LDL Cholesterol: 16 mg/dL (ref 0–99)
Total CHOL/HDL Ratio: 1.8 ratio
Triglycerides: 55 mg/dL (ref <150)
VLDL: 11 mg/dL (ref 0–40)

## 2023-07-13 LAB — FERRITIN: Ferritin: 29 ng/mL (ref 24–336)

## 2023-07-13 LAB — GLUCOSE, CAPILLARY
Glucose-Capillary: 48 mg/dL — ABNORMAL LOW (ref 70–99)
Glucose-Capillary: 112 mg/dL — ABNORMAL HIGH (ref 70–99)
Glucose-Capillary: 65 mg/dL — ABNORMAL LOW (ref 70–99)
Glucose-Capillary: 194 mg/dL — ABNORMAL HIGH (ref 70–99)
Glucose-Capillary: 162 mg/dL — ABNORMAL HIGH (ref 70–99)
Glucose-Capillary: 205 mg/dL — ABNORMAL HIGH (ref 70–99)
Glucose-Capillary: 157 mg/dL — ABNORMAL HIGH (ref 70–99)

## 2023-07-13 LAB — MAGNESIUM: Magnesium: 1.8 mg/dL (ref 1.7–2.4)

## 2023-07-13 MED ORDER — SPIRONOLACTONE 25 MG PO TABS
25.0000 mg | ORAL_TABLET | Freq: Every day | ORAL | Status: DC
  Administered 2023-07-14 – 2023-07-16 (×3): 25 mg via ORAL
  Filled 2023-07-13 (×3): qty 1

## 2023-07-13 MED ORDER — FLUTICASONE PROPIONATE 50 MCG/ACT NA SUSP
2.0000 | Freq: Every day | NASAL | Status: DC
  Administered 2023-07-13 – 2023-07-15 (×2): 2 via NASAL
  Filled 2023-07-13: qty 16

## 2023-07-13 MED ORDER — ASPIRIN 81 MG PO CHEW
81.0000 mg | CHEWABLE_TABLET | ORAL | Status: AC
  Administered 2023-07-14: 81 mg via ORAL
  Filled 2023-07-13: qty 1

## 2023-07-13 MED ORDER — SODIUM CHLORIDE 0.9 % IV SOLN: INTRAVENOUS | Status: DC

## 2023-07-13 NOTE — Progress Notes (Signed)
   Patient Name: Manuel [name] Date of Encounter: 07/13/2023 [facility] Cardiologist: New  Interval Summary  .    Overnight sugar dropped and took about an hour for the patient to feel better. He denies chest pain. I/Os not recorded overnight. Kidney function stable. He is agreeable to heart cath.   Vital Signs .    Vitals:   07/12/23 1941 07/13/23 0032 07/13/23 0401 07/13/23 0512  BP: 133/62 113/63 115/75   Pulse: 70 69 74   Resp: 17 19 17    Temp: 98.5 F (36.9 C) 98 F (36.7 C) (!) 97.5 F (36.4 C)   TempSrc: Oral Oral Axillary   SpO2: 96% 100% 97%   Weight:    84.9 kg  Height:        Intake/Output Summary (Last 24 hours) at 07/13/2023 0741 Last data filed at 07/13/2023 0300 Gross per 24 hour  Intake 760 ml  Output 2350 ml  Net -1590 ml      07/13/2023    5:12 AM 07/12/2023    5:40 AM 07/09/2023    1:30 PM  Last 3 Weights  Weight (lbs) 187 lb 2.7 oz 203 lb 4.2 oz 211 lb  Weight (kg) 84.9 kg 92.2 kg 95.709 kg      Telemetry/ECG    NSR HR 70 - Personally Reviewed  Physical Exam .   GEN: No acute distress.   Neck: No JVD Cardiac: RRR, no murmurs, rubs, or gallops.  Respiratory: diminished at bases. GI: Soft, nontender, non-distended  MS: 1-2+ lower leg edema; R BKA  Assessment & Plan .    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 - presented to the ER with chest pain and SOB - CXR showed vascular congestion and BNP 2395 - Echo showed LVEF 20-25%, similar to Echo in July - Echo in 2020 LVEF 40-45% - ischemic evaluation in 2020 with MPI which showed inferior infarct w/ peri-infarct ischemia - IV lasix 60mg  BID - Net -3.3L - kidney function stable - still volume up on eam - continue with diuresis   NSTEMI CAD  - s/p DES x2 to the RCA in 2011 with residual D1 and ramus dzs at that time - MPI in 2020 showed inferior infarct/peri-infarct ischemia - presented with chest pain and SOB - HS troponin [phone] stated on IV heparin - continue statin, plavix  and BB - plan for R/L heart cath on 3/24   HTN - Bps better - Coreg 25mg  BID - Entresto 24-26mg  BID - spironolactone 12.5mg  daily   HL - LDL 16 - continue Lipitor 80mg  daily   OSA - sleep study scheduled for April 2024   For questions or updates, please contact [facility] Please consult [email or web address] for contact info under        Signed, Donevin Sainsbury David Stall, PA-C

## 2023-07-13 NOTE — Progress Notes (Signed)
 PROGRESS NOTE    Manuel [name]  JJO:[id number] DOB: May 16, 1945 DOA: 07/09/2023 PCP: Gracelyn Nurse, MD  Outpatient Specialists: cardiology    Brief Narrative:   From admission h and p  [name] is a 78 year old male with history of CAD, PAD, history of right BKA, hyperlipidemia, hypertension, depression, anxiety, insomnia, who presents to the emergency department for chief concerns of shortness of breath, decreased urine output, and inability to sleep for the last year.   He reports he has been short of breath for about 1 year and the shortness of breath is worse when he lays down at night.  He reports this has caused him difficulty sleeping for 1 year.  He reports that he does get so tired that he is so short of breath when he lays down that he cannot go to sleep.   He reports that shortness of breath has worsened over the last week.  He reports there is also swelling of his lower extremity (left only).  He denies fever, chills, nausea, vomiting, cough, syncope.  He endorses chest pressure/chest discomfort especially when he lays flat at night.  He denies trauma to his person.  Assessment & Plan:   Principal Problem:   NSTEMI (non-ST elevated myocardial infarction) (HCC) Active Problems:   PAD (peripheral artery disease) (HCC)   Generalized weakness   Alcohol abuse   Coronary artery disease with history of myocardial infarction without history of CABG   Insulin dependent type 2 diabetes mellitus (HCC)   Chronic combined systolic and diastolic CHF (congestive heart failure) (HCC)   Chronic hyponatremia - baseline Na 128-130.   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 With chf exacerbation, RWMA on TTE. History stents --Mgmt per Cardiology --R/L heart cath planned tomorrow --s/p IV heparin  --ASA/plavix  --Lipitor   Acute on chronic HFrEF  2/2 ischemic cardiomyopathy. EF 25%. Net negative 1.8L. Down 8lbs since 3/19.  Remains volume overloaded.  --Mgmt per Cardiology --continue IV diuresis   --Continue entreso, coreg, aldactone, R/LHC when agreeable   Mild hyponatremia Likely 2/2 hypervolemia  Continue to monitor with IV diuresis   Goals of care Patient reports declining health last 1-2 years, is somewhat interested in a comfort-based approach - palliative consulted  PAD S/p recent right BKA that is healed. ABI of the LLE shows borderline PAD - outpt vascular f/u - home statin, plavix  EtOH use Reports drinking most days, vague on amount, says last drink a week ago, denies hx withdrawal - monitor  Insomnia Improved w/ diuresis likely component of  orthopnea/pnd likely contributory - home trazodone, mirtazapine  Type 2 diabetes - uncontrolled - A1c 8.0% --Sliding Scaleo Novolog --home 70/30, is on 30u bid at home, Continue at 15u bid --titrate regimen  Microcytic anemia  No gross bleeding noted.  Will check iron panel.     DVT prophylaxis: therapeutic heparin  Code Status: dnr/dni  Family Communication: none at bedside.   Pt is able to udpate.  Level of care: Progressive Status is: Inpatient Remains inpatient appropriate because: severity of illness    Consultants:  Cardiology, palliative  Procedures: None thus far  Antimicrobials:  none    Subjective: Pt seen this AM. Denies chest pain. Now agreeable to cath, planned for tomorrow.  Asks when he might go home after that.  Reports shortness of breath is improving. Long history of orthopnea and PND.    Objective: Vitals:   07/13/23 0401 07/13/23 0512 07/13/23 0830 07/13/23 1145  BP: 115/75  (!) 142/87 133/80  Pulse: 74  83  70  Resp: 17  20 19   Temp: (!) 97.5 F (36.4 C)  98.7 F (37.1 C) 98.5 F (36.9 C)  TempSrc: Axillary   Oral  SpO2: 97%  98% 96%  Weight:  84.9 kg    Height:        Intake/Output Summary (Last 24 hours) at 07/13/2023 1413 Last data filed at 07/13/2023 1142 Gross per 24 hour  Intake 1480 ml  Output 1960 ml  Net -480 ml   Filed Weights   07/09/23 1330 07/12/23 0540 07/13/23 0512  Weight: 95.7 kg 92.2 kg 84.9 kg    Examination:  General exam: awake, alert, no acute distress HEENT:moist mucus membranes, hearing grossly normal  Respiratory system: CTAB diminished, no wheezes, rales or rhonchi, normal respiratory effort. Cardiovascular system: normal S1/S2, RRR, +LLE edema.   Gastrointestinal system: soft, NT, ND, no HSM felt, +bowel sounds. Central nervous system: A&O x 3. no gross focal neurologic deficits, normal speech Extremities: R BKA, LLE edema Skin: dry, intact, normal temperature Psychiatry: normal mood, congruent affect, judgement and insight appear normal   Data Reviewed:   Notable labs -- Na 132, BUN 25, Ca 8.7, gap 4 Hbg 10.3 HDL 32 otherwise normal lipid panel Iron 31, sat ratio 8%, normal TIBC 381, low-normal ferritin 29      LOS: 4 days     Pennie Banter, DO [facility]   If 7PM-7AM, please contact night-coverage [email or web address] Password [email or web address] 07/13/2023, 2:13 PM

## 2023-07-13 NOTE — Progress Notes (Signed)
 Patient complain of sweating and weakness CBG done with result of 48mg /dl, offered some orange juice and snacks, latest CBG 112mg /dl. Patient feels better now.

## 2023-07-13 NOTE — Progress Notes (Signed)
                                                                                                                                                                                                           Daily Progress Note   Patient Name: Manuel [name]       Date: 07/13/2023 DOB: 1945-07-05  Age: 78 y.o. MRN#: [id number] Attending Physician: Pennie Banter, DO Primary Care Physician: Gracelyn Nurse, MD Admit Date: 07/09/2023  Reason for Consultation/Follow-up: Establishing goals of care  HPI/Brief Hospital Review: Mr. Faye Sanfilippo is a 78 year old male with history of CAD, PAD, history of right BKA, hyperlipidemia, hypertension, depression, anxiety, insomnia, who presents to the emergency department for chief concerns of shortness of breath, decreased urine output, and inability to sleep for the last year.    Palliative Medicine consulted for assisting with goals of care conversations.  Subjective: Extensive chart review has been completed prior to meeting patient including labs, vital signs, imaging, progress notes, orders, and available advanced directive documents from current and previous encounters.    Visited with Mr. Dolph at his bedside. He is awake, alert and able to engage in conversation. He reports feeling well today without acute complaints. He reports another restful night despite episode of hypoglycemia.  He shares after conversations with cardiology he has now decided to proceed with cardiac catheterization tomorrow. He is hopeful this will provide answers and guidance that will improve his overall quality of life. Potential plan to discharge home tomorrow after cardiac cath. Again requests assistance for his wife in medication reconciliation at discharge as she has many questions regarding current medication list.  PMT will continue to follow for ongoing needs and support.   Thank you for allowing the Palliative Medicine Team to assist in the care of this  patient.  Total time:  25 minutes  Time spent includes: Detailed review of medical records (labs, imaging, vital signs), medically appropriate exam (mental status, respiratory, cardiac, skin), discussed with treatment team, counseling and educating patient, family and staff, documenting clinical information, medication management and coordination of care.  Leeanne Deed, DNP, AGNP-C Palliative Medicine   Please contact Palliative Medicine Team phone at [phone] for questions and concerns.

## 2023-07-13 NOTE — Plan of Care (Signed)
  Problem: Education: Goal: Ability to describe self-care measures that may prevent or decrease complications (Diabetes Survival Skills Education) will improve Outcome: Progressing Goal: Individualized Educational Video(s) Outcome: Progressing   Problem: Coping: Goal: Ability to adjust to condition or change in health will improve Outcome: Progressing   Problem: Fluid Volume: Goal: Ability to maintain a balanced intake and output will improve Outcome: Progressing   Problem: Health Behavior/Discharge Planning: Goal: Ability to identify and utilize available resources and services will improve Outcome: Progressing Goal: Ability to manage health-related needs will improve Outcome: Progressing   Problem: Metabolic: Goal: Ability to maintain appropriate glucose levels will improve Outcome: Progressing   Problem: Nutritional: Goal: Maintenance of adequate nutrition will improve Outcome: Progressing Goal: Progress toward achieving an optimal weight will improve Outcome: Progressing   Problem: Skin Integrity: Goal: Risk for impaired skin integrity will decrease Outcome: Progressing   Problem: Tissue Perfusion: Goal: Adequacy of tissue perfusion will improve Outcome: Progressing   Problem: Education: Goal: Knowledge of General Education information will improve Description: Including pain rating scale, medication(s)/side effects and non-pharmacologic comfort measures Outcome: Progressing   Problem: Health Behavior/Discharge Planning: Goal: Ability to manage health-related needs will improve Outcome: Progressing   Problem: Clinical Measurements: Goal: Ability to maintain clinical measurements within normal limits will improve Outcome: Progressing Goal: Will remain free from infection Outcome: Progressing Goal: Diagnostic test results will improve Outcome: Progressing Goal: Respiratory complications will improve Outcome: Progressing Goal: Cardiovascular complication will  be avoided Outcome: Progressing   Problem: Activity: Goal: Risk for activity intolerance will decrease Outcome: Progressing   Problem: Nutrition: Goal: Adequate nutrition will be maintained Outcome: Progressing   Problem: Coping: Goal: Level of anxiety will decrease Outcome: Progressing   Problem: Elimination: Goal: Will not experience complications related to bowel motility Outcome: Progressing Goal: Will not experience complications related to urinary retention Outcome: Progressing   Problem: Pain Managment: Goal: General experience of comfort will improve and/or be controlled Outcome: Progressing   Problem: Safety: Goal: Ability to remain free from injury will improve Outcome: Progressing   Problem: Skin Integrity: Goal: Risk for impaired skin integrity will decrease Outcome: Progressing   Problem: Education: Goal: Ability to demonstrate management of disease process will improve Outcome: Progressing Goal: Ability to verbalize understanding of medication therapies will improve Outcome: Progressing Goal: Individualized Educational Video(s) Outcome: Progressing   Problem: Activity: Goal: Capacity to carry out activities will improve Outcome: Progressing   Problem: Cardiac: Goal: Ability to achieve and maintain adequate cardiopulmonary perfusion will improve Outcome: Progressing   Problem: Education: Goal: Understanding of CV disease, CV risk reduction, and recovery process will improve Outcome: Progressing Goal: Individualized Educational Video(s) Outcome: Progressing   Problem: Activity: Goal: Ability to return to baseline activity level will improve Outcome: Progressing   Problem: Cardiovascular: Goal: Ability to achieve and maintain adequate cardiovascular perfusion will improve Outcome: Progressing Goal: Vascular access site(s) Level 0-1 will be maintained Outcome: Progressing   Problem: Health Behavior/Discharge Planning: Goal: Ability to safely  manage health-related needs after discharge will improve Outcome: Progressing

## 2023-07-13 NOTE — H&P (View-Only) (Signed)
   Patient Name: Manuel [name] Date of Encounter: 07/13/2023 [facility] Cardiologist: New  Interval Summary  .    Overnight sugar dropped and took about an hour for the patient to feel better. He denies chest pain. I/Os not recorded overnight. Kidney function stable. He is agreeable to heart cath.   Vital Signs .    Vitals:   07/12/23 1941 07/13/23 0032 07/13/23 0401 07/13/23 0512  BP: 133/62 113/63 115/75   Pulse: 70 69 74   Resp: 17 19 17    Temp: 98.5 F (36.9 C) 98 F (36.7 C) (!) 97.5 F (36.4 C)   TempSrc: Oral Oral Axillary   SpO2: 96% 100% 97%   Weight:    84.9 kg  Height:        Intake/Output Summary (Last 24 hours) at 07/13/2023 0741 Last data filed at 07/13/2023 0300 Gross per 24 hour  Intake 760 ml  Output 2350 ml  Net -1590 ml      07/13/2023    5:12 AM 07/12/2023    5:40 AM 07/09/2023    1:30 PM  Last 3 Weights  Weight (lbs) 187 lb 2.7 oz 203 lb 4.2 oz 211 lb  Weight (kg) 84.9 kg 92.2 kg 95.709 kg      Telemetry/ECG    NSR HR 70 - Personally Reviewed  Physical Exam .   GEN: No acute distress.   Neck: No JVD Cardiac: RRR, no murmurs, rubs, or gallops.  Respiratory: diminished at bases. GI: Soft, nontender, non-distended  MS: 1-2+ lower leg edema; R BKA  Assessment & Plan .    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 - presented to the ER with chest pain and SOB - CXR showed vascular congestion and BNP 2395 - Echo showed LVEF 20-25%, similar to Echo in July - Echo in 2020 LVEF 40-45% - ischemic evaluation in 2020 with MPI which showed inferior infarct w/ peri-infarct ischemia - IV lasix 60mg  BID - Net -3.3L - kidney function stable - still volume up on eam - continue with diuresis   NSTEMI CAD  - s/p DES x2 to the RCA in 2011 with residual D1 and ramus dzs at that time - MPI in 2020 showed inferior infarct/peri-infarct ischemia - presented with chest pain and SOB - HS troponin [phone] stated on IV heparin - continue statin, plavix  and BB - plan for R/L heart cath on 3/24   HTN - Bps better - Coreg 25mg  BID - Entresto 24-26mg  BID - spironolactone 12.5mg  daily   HL - LDL 16 - continue Lipitor 80mg  daily   OSA - sleep study scheduled for April 2024   For questions or updates, please contact [facility] Please consult [email or web address] for contact info under        Signed, Donevin Sainsbury David Stall, PA-C

## 2023-07-13 NOTE — TOC Progression Note (Signed)
 Transition of Care [facility]) - Progression Note    Patient Details  Name: Manuel [name] MRN: [id number] Date of Birth: 09-28-1945  [facility]) CM/SW Contact  Rodney Langton, RN Phone Number: 07/13/2023, 1:17 PM  Clinical Narrative:      Patient advised of recommendation for home health PT, declines to set up at this time.  State he has a lot to think about, and if he decides to pursue home health, he will ask for order from PCP.       Expected Discharge Plan and Services                                               Social Determinants of Health (SDOH) Interventions SDOH Screenings   Food Insecurity: No Food Insecurity (07/10/2023)  Housing: Low Risk  (07/11/2023)  Transportation Needs: No Transportation Needs (07/11/2023)  Utilities: Not At Risk (07/10/2023)  Financial Resource Strain: Low Risk  (07/11/2023)  Social Connections: Unknown (07/10/2023)  Tobacco Use: Low Risk  (07/11/2023)  Recent Concern: Tobacco Use - Medium Risk (06/26/2023)   Received from [facility]    Readmission Risk Interventions    07/09/2023    8:30 PM 02/18/2023   11:58 AM 02/15/2023    4:10 PM  Readmission Risk Prevention Plan  Transportation Screening Complete  Complete  PCP or Specialist Appt within 3-5 Days  Complete Complete  HRI or Home Care Consult  Complete Complete  Social Work Consult for Recovery Care Planning/Counseling  Complete Complete  Palliative Care Screening  Not Applicable Not Applicable  Medication Review Oceanographer) Complete Complete Complete  HRI or Home Care Consult Complete    SW Recovery Care/Counseling Consult Complete    Palliative Care Screening Not Applicable    Skilled Nursing Facility Not Applicable

## 2023-07-13 NOTE — Plan of Care (Signed)
  Problem: Education: Goal: Ability to describe self-care measures that may prevent or decrease complications (Diabetes Survival Skills Education) will improve Outcome: Progressing Goal: Individualized Educational Video(s) Outcome: Progressing   Problem: Coping: Goal: Ability to adjust to condition or change in health will improve Outcome: Progressing   Problem: Fluid Volume: Goal: Ability to maintain a balanced intake and output will improve Outcome: Progressing   Problem: Health Behavior/Discharge Planning: Goal: Ability to identify and utilize available resources and services will improve Outcome: Progressing Goal: Ability to manage health-related needs will improve Outcome: Progressing   Problem: Metabolic: Goal: Ability to maintain appropriate glucose levels will improve Outcome: Progressing   Problem: Nutritional: Goal: Maintenance of adequate nutrition will improve Outcome: Progressing Goal: Progress toward achieving an optimal weight will improve Outcome: Progressing   Problem: Skin Integrity: Goal: Risk for impaired skin integrity will decrease Outcome: Progressing   Problem: Tissue Perfusion: Goal: Adequacy of tissue perfusion will improve Outcome: Progressing   Problem: Education: Goal: Knowledge of General Education information will improve Description: Including pain rating scale, medication(s)/side effects and non-pharmacologic comfort measures Outcome: Progressing   Problem: Health Behavior/Discharge Planning: Goal: Ability to manage health-related needs will improve Outcome: Progressing   Problem: Clinical Measurements: Goal: Diagnostic test results will improve Outcome: Progressing Goal: Cardiovascular complication will be avoided Outcome: Progressing   Problem: Activity: Goal: Risk for activity intolerance will decrease Outcome: Progressing   Problem: Nutrition: Goal: Adequate nutrition will be maintained Outcome: Progressing   Problem:  Elimination: Goal: Will not experience complications related to bowel motility Outcome: Progressing   Problem: Safety: Goal: Ability to remain free from injury will improve Outcome: Progressing   Problem: Skin Integrity: Goal: Risk for impaired skin integrity will decrease Outcome: Progressing

## 2023-07-14 ENCOUNTER — Encounter
Admission: EM | Discharge status: Home / Self Care | Payer: Self-pay | Source: Home / Self Care | Attending: Obstetrics and Gynecology

## 2023-07-14 ENCOUNTER — Other Ambulatory Visit: Payer: Self-pay

## 2023-07-14 ENCOUNTER — Encounter: Payer: Self-pay | Admitting: Cardiovascular Disease

## 2023-07-14 DIAGNOSIS — I2510 Atherosclerotic heart disease of native coronary artery without angina pectoris: Secondary | ICD-10-CM | POA: Diagnosis not present

## 2023-07-14 DIAGNOSIS — I5021 Acute systolic (congestive) heart failure: Secondary | ICD-10-CM | POA: Diagnosis not present

## 2023-07-14 DIAGNOSIS — I214 Non-ST elevation (NSTEMI) myocardial infarction: Secondary | ICD-10-CM | POA: Diagnosis not present

## 2023-07-14 HISTORY — PX: CORONARY STENT INTERVENTION: CATH118234

## 2023-07-14 HISTORY — PX: RIGHT/LEFT HEART CATH AND CORONARY ANGIOGRAPHY: CATH118266

## 2023-07-14 LAB — POCT I-STAT 7, (LYTES, BLD GAS, ICA,H+H)
Acid-Base Excess: 6 mmol/L — ABNORMAL HIGH (ref 0.0–2.0)
Bicarbonate: 29.8 mmol/L — ABNORMAL HIGH (ref 20.0–28.0)
Calcium, Ion: 1.18 mmol/L (ref 1.15–1.40)
HCT: 33 % — ABNORMAL LOW (ref 39.0–52.0)
Hemoglobin: 11.2 g/dL — ABNORMAL LOW (ref 13.0–17.0)
O2 Saturation: 94 %
Potassium: 3.8 mmol/L (ref 3.5–5.1)
Sodium: 137 mmol/L (ref 135–145)
TCO2: 31 mmol/L (ref 22–32)
pCO2 arterial: 41.1 mmHg (ref 32–48)
pH, Arterial: 7.468 — ABNORMAL HIGH (ref 7.35–7.45)
pO2, Arterial: 65 mmHg — ABNORMAL LOW (ref 83–108)

## 2023-07-14 LAB — POCT I-STAT EG7
Acid-Base Excess: 7 mmol/L — ABNORMAL HIGH (ref 0.0–2.0)
Bicarbonate: 32.1 mmol/L — ABNORMAL HIGH (ref 20.0–28.0)
Calcium, Ion: 1.21 mmol/L (ref 1.15–1.40)
HCT: 33 % — ABNORMAL LOW (ref 39.0–52.0)
Hemoglobin: 11.2 g/dL — ABNORMAL LOW (ref 13.0–17.0)
O2 Saturation: 51 %
Potassium: 3.7 mmol/L (ref 3.5–5.1)
Sodium: 137 mmol/L (ref 135–145)
TCO2: 34 mmol/L — ABNORMAL HIGH (ref 22–32)
pCO2, Ven: 48.5 mmHg (ref 44–60)
pH, Ven: 7.429 (ref 7.25–7.43)
pO2, Ven: 27 mmHg — CL (ref 32–45)

## 2023-07-14 LAB — BASIC METABOLIC PANEL
Anion gap: 7 (ref 5–15)
BUN: 29 mg/dL — ABNORMAL HIGH (ref 8–23)
CO2: 30 mmol/L (ref 22–32)
Calcium: 9 mg/dL (ref 8.9–10.3)
Chloride: 98 mmol/L (ref 98–111)
Creatinine, Ser: 0.81 mg/dL (ref 0.61–1.24)
GFR, Estimated: >60 mL/min (ref >60)
Glucose, Bld: 77 mg/dL (ref 70–99)
Potassium: 3.6 mmol/L (ref 3.5–5.1)
Sodium: 135 mmol/L (ref 135–145)

## 2023-07-14 LAB — GLUCOSE, CAPILLARY
Glucose-Capillary: 82 mg/dL (ref 70–99)
Glucose-Capillary: 78 mg/dL (ref 70–99)
Glucose-Capillary: 67 mg/dL — ABNORMAL LOW (ref 70–99)
Glucose-Capillary: 86 mg/dL (ref 70–99)
Glucose-Capillary: 98 mg/dL (ref 70–99)
Glucose-Capillary: 132 mg/dL — ABNORMAL HIGH (ref 70–99)
Glucose-Capillary: 216 mg/dL — ABNORMAL HIGH (ref 70–99)

## 2023-07-14 LAB — MAGNESIUM: Magnesium: 1.8 mg/dL (ref 1.7–2.4)

## 2023-07-14 LAB — POCT ACTIVATED CLOTTING TIME: Activated Clotting Time: 325 s

## 2023-07-14 LAB — MRSA NEXT GEN BY PCR, NASAL: MRSA by PCR Next Gen: NOT DETECTED

## 2023-07-14 SURGERY — RIGHT/LEFT HEART CATH AND CORONARY ANGIOGRAPHY
Anesthesia: Moderate Sedation

## 2023-07-14 MED ORDER — HEPARIN SODIUM (PORCINE) 1000 UNIT/ML IJ SOLN
INTRAMUSCULAR | Status: AC
  Filled 2023-07-14: qty 10

## 2023-07-14 MED ORDER — SODIUM CHLORIDE 0.9% FLUSH
3.0000 mL | Freq: Two times a day (BID) | INTRAVENOUS | Status: DC
  Administered 2023-07-14 – 2023-07-16 (×4): 3 mL via INTRAVENOUS

## 2023-07-14 MED ORDER — LIDOCAINE HCL (PF) 1 % IJ SOLN
INTRAMUSCULAR | Status: DC | PRN
  Administered 2023-07-14 (×2): 2 mL

## 2023-07-14 MED ORDER — ASPIRIN 81 MG PO CHEW
81.0000 mg | CHEWABLE_TABLET | Freq: Every day | ORAL | Status: DC
  Administered 2023-07-15 – 2023-07-16 (×2): 81 mg via ORAL
  Filled 2023-07-14 (×2): qty 1

## 2023-07-14 MED ORDER — FENTANYL CITRATE (PF) 100 MCG/2ML IJ SOLN
INTRAMUSCULAR | Status: DC | PRN
  Administered 2023-07-14: 50 ug via INTRAVENOUS
  Administered 2023-07-14 (×2): 25 ug via INTRAVENOUS
  Administered 2023-07-14: 50 ug via INTRAVENOUS

## 2023-07-14 MED ORDER — NITROGLYCERIN IN D5W 200-5 MCG/ML-% IV SOLN
INTRAVENOUS | Status: AC
Start: 2023-07-14 — End: ?
  Filled 2023-07-14: qty 250

## 2023-07-14 MED ORDER — MORPHINE SULFATE (PF) 2 MG/ML IV SOLN
2.0000 mg | INTRAVENOUS | Status: DC | PRN
  Administered 2023-07-14: 2 mg via INTRAVENOUS
  Filled 2023-07-14: qty 1

## 2023-07-14 MED ORDER — MIDAZOLAM HCL 2 MG/2ML IJ SOLN
INTRAMUSCULAR | Status: DC | PRN
  Administered 2023-07-14 (×2): 1 mg via INTRAVENOUS

## 2023-07-14 MED ORDER — CLOPIDOGREL BISULFATE 300 MG PO TABS
ORAL_TABLET | ORAL | Status: AC
  Filled 2023-07-14: qty 1

## 2023-07-14 MED ORDER — NOREPINEPHRINE 4 MG/250ML-% IV SOLN
INTRAVENOUS | Status: AC
  Filled 2023-07-14: qty 250

## 2023-07-14 MED ORDER — NITROGLYCERIN 1 MG/10 ML FOR IR/CATH LAB
INTRA_ARTERIAL | Status: DC | PRN
  Administered 2023-07-14 (×2): 200 ug via INTRACORONARY

## 2023-07-14 MED ORDER — FENTANYL CITRATE (PF) 100 MCG/2ML IJ SOLN
INTRAMUSCULAR | Status: AC
  Filled 2023-07-14: qty 2

## 2023-07-14 MED ORDER — NITROGLYCERIN 1 MG/10 ML FOR IR/CATH LAB
INTRA_ARTERIAL | Status: AC
  Filled 2023-07-14: qty 10

## 2023-07-14 MED ORDER — HEPARIN (PORCINE) IN NACL 1000-0.9 UT/500ML-% IV SOLN
INTRAVENOUS | Status: AC
  Filled 2023-07-14: qty 1000

## 2023-07-14 MED ORDER — ACETAMINOPHEN 325 MG PO TABS
ORAL_TABLET | ORAL | Status: AC
  Filled 2023-07-14: qty 2

## 2023-07-14 MED ORDER — VERAPAMIL HCL 2.5 MG/ML IV SOLN
INTRAVENOUS | Status: DC | PRN
  Administered 2023-07-14: 2.5 mg via INTRA_ARTERIAL

## 2023-07-14 MED ORDER — IOHEXOL 300 MG/ML  SOLN
INTRAMUSCULAR | Status: DC | PRN
  Administered 2023-07-14: 215 mL

## 2023-07-14 MED ORDER — SODIUM CHLORIDE 0.9 % IV SOLN: 250.0000 mL | INTRAVENOUS | Status: AC | PRN

## 2023-07-14 MED ORDER — SODIUM CHLORIDE 0.9 % IV SOLN
INTRAVENOUS | Status: DC | PRN
  Administered 2023-07-14: 4 ug/min via INTRAVENOUS

## 2023-07-14 MED ORDER — CHLORHEXIDINE GLUCONATE CLOTH 2 % EX PADS
6.0000 | MEDICATED_PAD | Freq: Every day | CUTANEOUS | Status: DC
  Administered 2023-07-14 – 2023-07-16 (×3): 6 via TOPICAL

## 2023-07-14 MED ORDER — NITROGLYCERIN IN D5W 200-5 MCG/ML-% IV SOLN
0.0000 ug/min | INTRAVENOUS | Status: DC
  Administered 2023-07-14: 15 ug/min via INTRAVENOUS

## 2023-07-14 MED ORDER — HYDROMORPHONE HCL 1 MG/ML IJ SOLN
0.5000 mg | INTRAMUSCULAR | Status: DC | PRN
  Administered 2023-07-14: 0.5 mg via INTRAVENOUS
  Filled 2023-07-14: qty 1

## 2023-07-14 MED ORDER — CLOPIDOGREL BISULFATE 75 MG PO TABS
ORAL_TABLET | ORAL | Status: DC | PRN
  Administered 2023-07-14: 300 mg via ORAL

## 2023-07-14 MED ORDER — VERAPAMIL HCL 2.5 MG/ML IV SOLN
INTRAVENOUS | Status: AC
  Filled 2023-07-14: qty 2

## 2023-07-14 MED ORDER — SODIUM CHLORIDE 0.9% FLUSH: 3.0000 mL | INTRAVENOUS | Status: DC | PRN

## 2023-07-14 MED ORDER — NITROGLYCERIN IN D5W 200-5 MCG/ML-% IV SOLN
INTRAVENOUS | Status: DC | PRN
  Administered 2023-07-14: 10 ug/min via INTRAVENOUS

## 2023-07-14 MED ORDER — MIDAZOLAM HCL 2 MG/2ML IJ SOLN
INTRAMUSCULAR | Status: AC
  Filled 2023-07-14: qty 2

## 2023-07-14 MED ORDER — HEPARIN SODIUM (PORCINE) 1000 UNIT/ML IJ SOLN
INTRAMUSCULAR | Status: DC | PRN
  Administered 2023-07-14 (×2): 4000 [IU] via INTRAVENOUS
  Administered 2023-07-14: 5000 [IU] via INTRAVENOUS

## 2023-07-14 MED ORDER — HEPARIN (PORCINE) IN NACL 1000-0.9 UT/500ML-% IV SOLN
INTRAVENOUS | Status: DC | PRN
  Administered 2023-07-14: 1000 mL

## 2023-07-14 SURGICAL SUPPLY — 28 items
BALLN MINITREK RX 2.0X15 (BALLOONS) ×1 IMPLANT
BALLN TREK RX 2.25X20 (BALLOONS) ×1 IMPLANT
BALLN ~~LOC~~ EUPHORA RX 2.5X15 (BALLOONS) ×1 IMPLANT
BALLOON MINITREK RX 2.0X15 (BALLOONS) IMPLANT
BALLOON TREK RX 2.25X20 (BALLOONS) IMPLANT
BALLOON ~~LOC~~ EUPHORA RX 2.5X15 (BALLOONS) IMPLANT
CATH BALLN WEDGE 5F 110CM (CATHETERS) IMPLANT
CATH INFINITI AMBI 5FR JK (CATHETERS) IMPLANT
CATH LAUNCHER 6FR EBU3.5 (CATHETERS) IMPLANT
CATH TELESCOPE 6F GEC (CATHETERS) IMPLANT
DEVICE RAD TR BAND REGULAR (VASCULAR PRODUCTS) IMPLANT
DRAPE BRACHIAL (DRAPES) IMPLANT
GLIDESHEATH SLEND SS 6F .021 (SHEATH) IMPLANT
GUIDEWIRE EMER 3M J .025X150CM (WIRE) IMPLANT
GUIDEWIRE INQWIRE 1.5J.035X260 (WIRE) IMPLANT
INQWIRE 1.5J .035X260CM (WIRE) ×1 IMPLANT
KIT ENCORE 26 ADVANTAGE (KITS) IMPLANT
PACK CARDIAC CATH (CUSTOM PROCEDURE TRAY) ×1 IMPLANT
PROTECTION STATION PRESSURIZED (MISCELLANEOUS) ×1 IMPLANT
SET ATX-X65L (MISCELLANEOUS) IMPLANT
SHEATH GLIDE SLENDER 4/5FR (SHEATH) IMPLANT
STATION PROTECTION PRESSURIZED (MISCELLANEOUS) IMPLANT
STENT ONYX FRONTIER 2.25X38 (Permanent Stent) IMPLANT
STENT ONYX FRONTIER 2.5X26 (Permanent Stent) IMPLANT
STENT ONYX FRONTIER 2.75X15 (Permanent Stent) IMPLANT
TUBING CIL FLEX 10 FLL-RA (TUBING) IMPLANT
WIRE RUNTHROUGH .014X180CM (WIRE) IMPLANT
WIRE RUNTHROUGH .014X300CM (WIRE) IMPLANT

## 2023-07-14 NOTE — Progress Notes (Signed)
 Patient refused insulin, states he's felt hypoglycemic off and on. Per Dayshift RN, pt refused evening coverage as well. Wife at bed side.

## 2023-07-14 NOTE — Progress Notes (Signed)
 Several apneic episodes while sleeping, sats dropping down to 80's . Wife at bedside who is very concerned about repeated alarm. Patient states he would like to try cpap while here. Contacted [facility] for orders .

## 2023-07-14 NOTE — Progress Notes (Signed)
 PROGRESS NOTE    Manuel [name]  HQI:[id number] DOB: 04/24/1945 DOA: 07/09/2023 PCP: Gracelyn Nurse, MD  Outpatient Specialists: cardiology    Brief Narrative:   From admission h and p  [name] is a 78 year old male with history of CAD, PAD, history of right BKA, hyperlipidemia, hypertension, depression, anxiety, insomnia, who presents to the emergency department for chief concerns of shortness of breath, decreased urine output, and inability to sleep for the last year.   He reports he has been short of breath for about 1 year and the shortness of breath is worse when he lays down at night.  He reports this has caused him difficulty sleeping for 1 year.  He reports that he does get so tired that he is so short of breath when he lays down that he cannot go to sleep.   He reports that shortness of breath has worsened over the last week.  He reports there is also swelling of his lower extremity (left only).  He denies fever, chills, nausea, vomiting, cough, syncope.  He endorses chest pressure/chest discomfort especially when he lays flat at night.  He denies trauma to his person.  Assessment & Plan:   Principal Problem:   NSTEMI (non-ST elevated myocardial infarction) (HCC) Active Problems:   PAD (peripheral artery disease) (HCC)   Generalized weakness   Alcohol abuse   Coronary artery disease with history of myocardial infarction without history of CABG   Insulin dependent type 2 diabetes mellitus (HCC)   Chronic combined systolic and diastolic CHF (congestive heart failure) (HCC)   Chronic hyponatremia - baseline Na 128-130.   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 With chf exacerbation, RWMA on TTE. History of prior stents 3/24 -- R/L cath today, 3 LAD stents placed --Mgmt per Cardiology --Now in ICU on nitroglycerin drip due to chest pain during procedure --s/p IV heparin  --ASA/plavix  --Lipitor, Coreg   Acute on chronic HFrEF  2/2 ischemic cardiomyopathy. EF 25%. Net negative 1.8L. Down 8lbs since 3/19.  Remains volume overloaded.  --Mgmt per Cardiology --Continue IV diuresis -- per cardiology --Continue entreso, coreg, aldactone, R/LHC when agreeable   Mild hyponatremia Likely 2/2 hypervolemia  Continue to monitor with IV diuresis   Goals of care Patient reports declining health last 1-2 years, is somewhat interested in a comfort-based approach - palliative consulted  PAD S/p right BKA in Dec 2024.   ABI of the LLE shows borderline PAD - outpt vascular f/u - home statin, plavix  EtOH use Reports drinking most days, vague on amount, says last drink a week ago, denies hx withdrawal - monitor  Insomnia Improved w/ diuresis likely component of  orthopnea/pnd likely contributory - home trazodone, mirtazapine  Type 2 diabetes - uncontrolled - A1c 8.0% --Sliding Scaleo Novolog --home 70/30, is on 30u bid at home, Continue at 15u bid --titrate regimen  Microcytic anemia  No gross bleeding noted.  Will check iron panel.     DVT prophylaxis: therapeutic heparin  Code Status: dnr/dni  Family Communication: none at bedside.   Pt is able to udpate.  Level of care: Stepdown Status is: Inpatient  Remains inpatient appropriate because: severity of illness    Consultants:  Cardiology Palliative Care  Procedures: Right/Left cardiac cath -- see report  Antimicrobials:  none    Subjective: Pt seen after cath today. Had 3 stents placed.  Having some chest pain but improved on nitro   Objective: Vitals:   07/14/23 1335 07/14/23 1340 07/14/23 1345 07/14/23 1358  BP: 109/69 122/73  119/77 110/68  Pulse: 66 69 68 68  Resp: (!) 23 18 19 20   Temp:    (!) 97.3 F (36.3 C)  TempSrc:    Oral  SpO2: 98% 98% 98% 92%  Weight:      Height:        Intake/Output Summary (Last 24 hours) at 07/14/2023 1407 Last data filed at 07/14/2023 1347 Gross per 24 hour  Intake 360 ml  Output 1880 ml  Net -1520 ml   Filed Weights   07/12/23 0540 07/13/23 0512 07/14/23 0702  Weight: 92.2 kg 84.9 kg 84.7 kg    Examination:  General exam: awake, drwosy, no acute distress Respiratory system: on room air, normal respiratory effort. Cardiovascular system: RRR, +LLE edema.   Gastrointestinal system: soft, NT, ND Central nervous system:no gross focal neurologic deficits, normal speech Extremities: R BKA, LLE edema Psychiatry: normal mood, congruent affec   Data Reviewed:   Notable labs --  Normal BMP except BUN 29 Hbg 11.2 stable   HDL 32 otherwise normal lipid panel Iron 31, sat ratio 8%, normal TIBC 381, low-normal ferritin 29      LOS: 5 days     Pennie Banter, DO [facility]   If 7PM-7AM, please contact night-coverage [email or web address] Password [facility] 07/14/2023, 2:07 PM

## 2023-07-14 NOTE — Care Management Important Message (Signed)
 Important Message  Patient Details  Name: Manuel [name] MRN: [id number] Date of Birth: 1945-05-12   Important Message Given:  Yes - Medicare IM     Cristela Blue, CMA 07/14/2023, 12:57 PM

## 2023-07-14 NOTE — Progress Notes (Signed)
 Progress Note  Patient Name: Manuel [name] Date of Encounter: 07/14/2023  Primary Cardiologist: Previously followed by [facility] 2nd opinion   Subjective   N.p.o. for [facility] today.  Documented UOP 1.7 L for the past 24 hours, net - 5 L for the admission. BUN starting to trend up, SCr stable. Weight 84.9 kg to 84.7 kg over the past 24 hours. No chest pain or dyspnea. Feels "dry." Notes some nasal congestion.   Inpatient Medications    Scheduled Meds:  atorvastatin  80 mg Oral QHS   carvedilol  25 mg Oral BID WC   clopidogrel  75 mg Oral Daily   enoxaparin (LOVENOX) injection  45 mg Subcutaneous Q24H   feeding supplement  237 mL Oral BID BM   fluticasone  2 spray Each Nare Daily   folic acid  1 mg Oral Daily   furosemide  60 mg Intravenous BID   insulin aspart  0-5 Units Subcutaneous QHS   insulin aspart  0-9 Units Subcutaneous TID WC   insulin aspart protamine- aspart  15 Units Subcutaneous BID WC   loratadine  10 mg Oral Daily   multivitamin with minerals  1 tablet Oral Daily   sacubitril-valsartan  1 tablet Oral Q12H   spironolactone  25 mg Oral Daily   thiamine  100 mg Oral Daily   Or   thiamine  100 mg Intravenous Daily   traZODone  100 mg Oral QHS   Continuous Infusions:  sodium chloride     PRN Meds: acetaminophen **OR** acetaminophen, ondansetron **OR** ondansetron (ZOFRAN) IV, sodium chloride   Vital Signs    Vitals:   07/14/23 0018 07/14/23 0439 07/14/23 0702 07/14/23 0818  BP: (!) 106/56 138/83  132/78  Pulse:  71  68  Resp: 20 20    Temp: 98.7 F (37.1 C) 98.1 F (36.7 C)  97.8 F (36.6 C)  TempSrc: Oral Oral    SpO2: 93% 96%  96%  Weight:   84.7 kg   Height:        Intake/Output Summary (Last 24 hours) at 07/14/2023 0840 Last data filed at 07/13/2023 2000 Gross per 24 hour  Intake 720 ml  Output 2790 ml  Net -2070 ml   Filed Weights   07/12/23 0540 07/13/23 0512 07/14/23 0702  Weight: 92.2 kg 84.9 kg 84.7 kg     Telemetry    Sinus with rare isolated PVC and artifact - Personally Reviewed  ECG    No new tracings - Personally Reviewed  Physical Exam   GEN: No acute distress.   Neck: No JVD. Cardiac: RRR, no murmurs, rubs, or gallops.  Respiratory: Clear to auscultation bilaterally.  GI: Soft, nontender, non-distended.   MS: Trivial left lower extremity edema, no palpable right stump edema; No deformity. Neuro:  Alert and oriented x 3; Nonfocal.  Psych: Normal affect.  Labs    Chemistry Recent Labs  Lab 07/09/23 1152 07/10/23 0452 07/12/23 4098 07/13/23 0518 07/14/23 0629  NA 132*   < > 132* 132* 135  K 5.3*   < > 3.7 3.9 3.6  CL 99   < > 98 98 98  CO2 27   < > 27 30 30   GLUCOSE 288*   < > 123* 97 77  BUN 21   < > 21 25* 29*  CREATININE 1.01   < > 0.87 0.90 0.81  CALCIUM 9.5   < > 9.2 8.7* 9.0  PROT 7.2  --   --   --   --  ALBUMIN 3.5  --   --   --   --   AST 21  --   --   --   --   ALT 14  --   --   --   --   ALKPHOS 83  --   --   --   --   BILITOT 0.9  --   --   --   --   GFRNONAA >60   < > >60 >60 >60  ANIONGAP 6   < > 7 4* 7   < > = values in this interval not displayed.     Hematology Recent Labs  Lab 07/10/23 1717 07/11/23 0407 07/13/23 0518  WBC 9.2 6.5 6.6  RBC 4.37 4.27 4.17*  HGB 11.1* 10.6* 10.3*  HCT 34.8* 33.3* 31.8*  MCV 79.6* 78.0* 76.3*  MCH 25.4* 24.8* 24.7*  MCHC 31.9 31.8 32.4  RDW 16.1* 15.9* 16.5*  PLT 277 246 223    Cardiac EnzymesNo results for input(s): "TROPONINI" in the last 168 hours. No results for input(s): "TROPIPOC" in the last 168 hours.   BNP Recent Labs  Lab 07/09/23 1152  BNP 2,395.3*     DDimer  Recent Labs  Lab 07/10/23 1717  DDIMER 0.60*     Radiology    No results found.  Cardiac Studies   2D echo 07/10/2023: 1. Left ventricular ejection fraction, by estimation, is 25 to 30%. The  left ventricle has severely decreased function. The left ventricle  demonstrates regional wall motion abnormalities  (see scoring  diagram/findings for description). The left  ventricular internal cavity size was moderately dilated. Left ventricular  diastolic parameters were normal. The global longitudinal strain is  normal.   2. Right ventricular systolic function is normal. The right ventricular  size is normal. There is normal pulmonary artery systolic pressure.   3. The mitral valve is normal in structure. Mild mitral valve  regurgitation. No evidence of mitral stenosis.   4. Tricuspid valve regurgitation is mild to moderate.   5. The aortic valve is normal in structure. Aortic valve regurgitation is  not visualized. Aortic valve sclerosis is present, with no evidence of  aortic valve stenosis.   6. The inferior vena cava is normal in size with greater than 50%  respiratory variability, suggesting right atrial pressure of 3 mmHg.  __________  2D echo 11/14/2022: 1. Left ventricular ejection fraction, by estimation, is 25 to 30%. The  left ventricle has severely decreased function. The left ventricle  demonstrates regional wall motion abnormalities (see scoring  diagram/findings for description). Left ventricular  diastolic parameters were normal.   2. Right ventricular systolic function is normal. The right ventricular  size is normal.   3. The mitral valve is normal in structure. Mild to moderate mitral valve  regurgitation. No evidence of mitral stenosis.   4. Tricuspid valve regurgitation is mild to moderate.   5. The aortic valve is normal in structure. Aortic valve regurgitation is  not visualized. No aortic stenosis is present.   6. The inferior vena cava is normal in size with greater than 50%  respiratory variability, suggesting right atrial pressure of 3 mmHg.  __________  Eugenie Birks MPI 11/10/2018 [facility]): LVEF= 45%   FINDINGS:  Regional wall motion:  demonstrates  hypokinesis of the inferior wall.  The overall quality of the study is fair.   Artifacts noted: no  Left  ventricular cavity: normal.   Perfusion Analysis:  SPECT images demonstrate moderate perfusion  abnormality  of moderate intensity is present in the inferior region on the  stress images.  Resting images show moderate perfusion abnormality of  moderate intensity of the inferior myocardial region with slight  improvements consistent with previous infarct and/or scar as well as with  minimal amount of myocardial ischemia and reversibility defect type: Mixed     Patient Profile     78 y.o. male with history of CAD status post PCI to the mid RCA with 2 overlapping Xience stents in 2011, HFrEF/ICM, extensive PAD status post bilateral lower extremity percutaneous interventions and most recently a right BKA in 03/2023, DM2, HTN, HLD, OSA, depression, alcohol use, osteoarthritis, and gout who was previously followed by [facility] Potters and we are seeing for second opinion regarding acute on chronic HFrEF and NSTEMI  Assessment & Plan    1.  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: -Net negative 5 L for the admission  -Remains on IV Lasix 60 mg bid -Currently on Entresto 24/26 mg twice daily, carvedilol 25 mg twice daily, and has spironolactone 25 mg ordered to be started at this morning (trend potassium with this with recommendation to maintain at goal 4.0) -Would look to add SGLT2i over the next 24 hours as able -BUN starting to trend up, SCr stable -R/LHC today, adjust diuresis accordingly  -CHF education -Daily weights -Strict I/O  2.  CAD involving native coronary arteries with NSTEMI: -No symptoms of angina -Status post IV heparin earlier this admission  -Plavix -R/LHC today, agrees to suspend DNR for cardiac cath with reinstatement thereafter  -Continue Coreg and Lipitor   3.  HTN: -Blood pressure is well-controlled -Pharmacotherapy as above  4.  HLD: -LDL 16 this admission -Lipitor 80 mg  5.  PAD: -Status post multiple interventions and right BKA in 03/2023 -Remains on clopidogrel 75 mg and  atorvastatin 80 mg  6.  DM2: -A1c 8.0 this admission -SSI management per primary service  7.  Microcytic anemia: -Hemoglobin stable at 10.3   Informed Consent   Shared Decision Making/Informed Consent{  The risks [stroke (1 in 1000), death (1 in 1000), kidney failure [usually temporary] (1 in 500), bleeding (1 in 200), allergic reaction [possibly serious] (1 in 200)], benefits (diagnostic support and management of coronary artery disease) and alternatives of a cardiac catheterization were discussed in detail with Mr. Govoni and he is willing to proceed.      For questions or updates, please contact [facility] Please consult [email or web address] for contact info under Cardiology/STEMI.    Signed, Eula Listen, PA-C [facility] Pager: [phone] 07/14/2023, 8:40 AM

## 2023-07-14 NOTE — Interval H&P Note (Signed)
 History and Physical Interval Note:  07/14/2023 10:55 AM  Manuel [name]  has presented today for surgery, with the diagnosis of chest pain.  The various methods of treatment have been discussed with the patient and family. After consideration of risks, benefits and other options for treatment, the patient has consented to  Procedure(s): RIGHT/LEFT HEART CATH AND CORONARY ANGIOGRAPHY (N/A) as a surgical intervention.  The patient's history has been reviewed, patient examined, no change in status, stable for surgery.  I have reviewed the patient's chart and labs.  Questions were answered to the patient's satisfaction.     Lorine Bears

## 2023-07-14 NOTE — Progress Notes (Incomplete)
 Heart Failure Stewardship Pharmacy Note  PCP: Gracelyn Nurse, MD PCP-Cardiologist: None  HPI: Manuel [name] is a 78 y.o. male with CAD, PAD s/p right BKA, gout, hyperlipidemia, hypertension, depression, anxiety, insomnia  who presented with insomnia, shortness of breath, and decreased urine output. On admission, BNP was 2395.3, HS-troponin was 1045 > 944 > 895, and A1c was 8. Chest x-ray noted mild diffuse vascular congestion, bilateral trace pleural effusions, concerning for CHF.   Pertinent cardiac history: CAD s/p PCI to [facility] with overlapping stents in 2011. extensive PAD s/p PTA L common Iliac, R peroneal, R distal SFA/prox popliteal, and stent to R distal SFA/prox popliteal arteries (05/14/21). Echo in 10/2022 with LVEF of 25-30%. Echo 06/2023 with LVEF 25-30%, moderately dilated LV, and mild valvular disease.  Pertinent Lab Values: Creatinine  Date Value Ref Range Status  02/16/2012 0.72 0.60 - 1.30 mg/dL Final   Creatinine, Ser  Date Value Ref Range Status  07/14/2023 0.81 0.61 - 1.24 mg/dL Final   BUN  Date Value Ref Range Status  07/14/2023 29 (H) 8 - 23 mg/dL Final  16/01/9603 10 7 - 18 mg/dL Final   Potassium  Date Value Ref Range Status  07/14/2023 3.6 3.5 - 5.1 mmol/L Final  02/16/2012 3.8 3.5 - 5.1 mmol/L Final   Sodium  Date Value Ref Range Status  07/14/2023 135 135 - 145 mmol/L Final  02/16/2012 137 136 - 145 mmol/L Final   B Natriuretic Peptide  Date Value Ref Range Status  07/09/2023 2,395.3 (H) 0.0 - 100.0 pg/mL Final    Comment:    Performed at [facility], [address] [place], [place] [address]   Magnesium  Date Value Ref Range Status  07/14/2023 1.8 1.7 - 2.4 mg/dL Final    Comment:    Performed at [facility], [address] [place], [place] [address]   Hgb A1c MFr Bld  Date Value Ref Range Status  07/10/2023 8.0 (H) 4.8 - 5.6 % Final    Comment:    (NOTE) Pre diabetes:          5.7%-6.4%  Diabetes:               >6.4%  Glycemic control for   <7.0% adults with diabetes    TSH  Date Value Ref Range Status  04/19/2023 1.416 0.350 - 4.500 uIU/mL Final    Comment:    Performed by a 3rd Generation assay with a functional sensitivity of <=0.01 uIU/mL. Performed at [facility], [address] [place], [place] [address]     Vital Signs: Admission weight: Temp:  [97.8 F (36.6 C)-98.9 F (37.2 C)] 97.8 F (36.6 C) (03/24 0818) Pulse Rate:  [68-88] 68 (03/24 0818) Cardiac Rhythm: Normal sinus rhythm (03/23 1930) Resp:  [19-20] 20 (03/24 0439) BP: (106-141)/(56-83) 132/78 (03/24 0818) SpO2:  [93 %-97 %] 96 % (03/24 0818) Weight:  [84.7 kg (186 lb 11.7 oz)] 84.7 kg (186 lb 11.7 oz) (03/24 0702)  Intake/Output Summary (Last 24 hours) at 07/14/2023 6213 Last data filed at 07/13/2023 2000 Gross per 24 hour  Intake 720 ml  Output 2790 ml  Net -2070 ml    Current Heart Failure Medications:  Loop diuretic: Beta-Blocker: ACEI/ARB/ARNI: MRA: SGLT2i: Other:  Prior to admission Heart Failure Medications:  Loop diuretic: furosemide 20 mg x 10 days Beta-Blocker: carvedilol 25 mg BID ACEI/ARB/ARNI: Entresto 24-26 mg BID MRA: spironolactone 50 mg daily SGLT2i: Other:  Assessment: 1. {CHL AMB Acute or Acute on  chronic:210917277} {Systolic/diastolic/combined systolic/diastolic:210917278} heart failure (LVEF ***%) ***, due to ***. NYHA class *** symptoms.   - Plan: 1) Medication changes recommended at this time:  2) Patient assistance:   3) Education: -To be completed prior to discharge.  *** Medication Assistance / Insurance Benefits Check: Does the patient have prescription insurance?    Type of insurance plan:  Does the patient qualify for medication assistance through manufacturers or grants? {CHL AMB Yes/No/Pending:210917269}  Eligible grants and/or patient assistance programs: ***  Medication assistance applications in progress: ***  Medication assistance  applications approved: *** Approved medication assistance renewals will be completed by: ***  Outpatient Pharmacy: Prior to admission outpatient pharmacy: ***      ***

## 2023-07-14 NOTE — Plan of Care (Signed)
                                                     Palliative Care Progress Note   Patient Name: Manuel [name]       Date: 07/14/2023 DOB: 11-Feb-1946  Age: 78 y.o. MRN#: [id number] Attending Physician: Pennie Banter, DO Primary Care Physician: Gracelyn Nurse, MD Admit Date: 07/09/2023  Extensive chart review completed including labs, vital signs, imaging, progress notes, orders, and available advanced directive documents from current and previous encounters.   After reviewing the chart, I went bedside to visit with patient.  He was off of the floor to cardiac cath. I will attempt to f/u with patient at a later time.  No acute palliative needs at this time.  PMT will continue to follow and support patient throughout his hospitalization.  Thank you for allowing the Palliative Medicine Team to assist in the care of [facility].  Samara Deist L. Bonita Quin, DNP, FNP-BC Palliative Medicine Team  No charge

## 2023-07-15 ENCOUNTER — Other Ambulatory Visit: Payer: Self-pay

## 2023-07-15 DIAGNOSIS — Z7189 Other specified counseling: Secondary | ICD-10-CM | POA: Diagnosis not present

## 2023-07-15 DIAGNOSIS — I214 Non-ST elevation (NSTEMI) myocardial infarction: Secondary | ICD-10-CM | POA: Diagnosis not present

## 2023-07-15 LAB — BASIC METABOLIC PANEL
Anion gap: 8 (ref 5–15)
BUN: 26 mg/dL — ABNORMAL HIGH (ref 8–23)
CO2: 28 mmol/L (ref 22–32)
Calcium: 8.9 mg/dL (ref 8.9–10.3)
Chloride: 96 mmol/L — ABNORMAL LOW (ref 98–111)
Creatinine, Ser: 0.94 mg/dL (ref 0.61–1.24)
GFR, Estimated: >60 mL/min (ref >60)
Glucose, Bld: 193 mg/dL — ABNORMAL HIGH (ref 70–99)
Potassium: 3.6 mmol/L (ref 3.5–5.1)
Sodium: 132 mmol/L — ABNORMAL LOW (ref 135–145)

## 2023-07-15 LAB — GLUCOSE, CAPILLARY
Glucose-Capillary: 170 mg/dL — ABNORMAL HIGH (ref 70–99)
Glucose-Capillary: 165 mg/dL — ABNORMAL HIGH (ref 70–99)
Glucose-Capillary: 233 mg/dL — ABNORMAL HIGH (ref 70–99)
Glucose-Capillary: 282 mg/dL — ABNORMAL HIGH (ref 70–99)
Glucose-Capillary: 119 mg/dL — ABNORMAL HIGH (ref 70–99)

## 2023-07-15 LAB — CBC
HCT: 30.1 % — ABNORMAL LOW (ref 39.0–52.0)
Hemoglobin: 9.8 g/dL — ABNORMAL LOW (ref 13.0–17.0)
MCH: 24.6 pg — ABNORMAL LOW (ref 26.0–34.0)
MCHC: 32.6 g/dL (ref 30.0–36.0)
MCV: 75.4 fL — ABNORMAL LOW (ref 80.0–100.0)
Platelets: 222 10*3/uL (ref 150–400)
RBC: 3.99 MIL/uL — ABNORMAL LOW (ref 4.22–5.81)
RDW: 16.3 % — ABNORMAL HIGH (ref 11.5–15.5)
WBC: 5.6 10*3/uL (ref 4.0–10.5)
nRBC: 0 % (ref 0.0–0.2)

## 2023-07-15 LAB — POCT ACTIVATED CLOTTING TIME: Activated Clotting Time: 204 s

## 2023-07-15 LAB — MAGNESIUM: Magnesium: 1.8 mg/dL (ref 1.7–2.4)

## 2023-07-15 MED ORDER — FUROSEMIDE 20 MG PO TABS
40.0000 mg | ORAL_TABLET | Freq: Two times a day (BID) | ORAL | Status: DC
  Administered 2023-07-15 – 2023-07-16 (×2): 40 mg via ORAL
  Filled 2023-07-15 (×2): qty 2

## 2023-07-15 MED ORDER — TRAZODONE HCL 50 MG PO TABS
25.0000 mg | ORAL_TABLET | Freq: Every evening | ORAL | Status: DC | PRN
  Administered 2023-07-15: 25 mg via ORAL
  Filled 2023-07-15: qty 1

## 2023-07-15 MED ORDER — DAPAGLIFLOZIN PROPANEDIOL 10 MG PO TABS
10.0000 mg | ORAL_TABLET | Freq: Every day | ORAL | Status: DC
  Administered 2023-07-16: 10 mg via ORAL
  Filled 2023-07-15: qty 1

## 2023-07-15 NOTE — Progress Notes (Signed)
 Progress Note  Patient Name: Manuel [name] Date of Encounter: 07/15/2023  Primary Cardiologist: Previously followed by [facility] 2nd opinion   Subjective   He reports resolution of chest pain and improvement in shortness of breath.  He is off nitroglycerin drip.  Inpatient Medications    Scheduled Meds:  aspirin  81 mg Oral Daily   atorvastatin  80 mg Oral QHS   carvedilol  25 mg Oral BID WC   Chlorhexidine Gluconate Cloth  6 each Topical Daily   clopidogrel  75 mg Oral Daily   [START ON 07/16/2023] dapagliflozin propanediol  10 mg Oral Daily   enoxaparin (LOVENOX) injection  45 mg Subcutaneous Q24H   feeding supplement  237 mL Oral BID BM   fluticasone  2 spray Each Nare Daily   folic acid  1 mg Oral Daily   furosemide  40 mg Oral BID   insulin aspart  0-5 Units Subcutaneous QHS   insulin aspart  0-9 Units Subcutaneous TID WC   insulin aspart protamine- aspart  15 Units Subcutaneous BID WC   loratadine  10 mg Oral Daily   multivitamin with minerals  1 tablet Oral Daily   sacubitril-valsartan  1 tablet Oral Q12H   sodium chloride flush  3 mL Intravenous Q12H   spironolactone  25 mg Oral Daily   thiamine  100 mg Oral Daily   Or   thiamine  100 mg Intravenous Daily   traZODone  100 mg Oral QHS   Continuous Infusions:  sodium chloride     nitroGLYCERIN Stopped (07/14/23 2043)   PRN Meds: sodium chloride, HYDROmorphone (DILAUDID) injection, sodium chloride, sodium chloride flush, traZODone   Vital Signs    Vitals:   07/15/23 0500 07/15/23 0700 07/15/23 0800 07/15/23 0828  BP: 99/64 123/67 132/88 120/64  Pulse: 67 67 72 73  Resp: (!) 26 (!) 23 (!) 23   Temp: 97.8 F (36.6 C)  (!) 97.2 F (36.2 C)   TempSrc: Oral  Axillary   SpO2: 99% 100% 94%   Weight: 83.5 kg     Height:        Intake/Output Summary (Last 24 hours) at 07/15/2023 0919 Last data filed at 07/15/2023 0500 Gross per 24 hour  Intake 527.39 ml  Output 1550 ml  Net  -1022.61 ml   Filed Weights   07/13/23 0512 07/14/23 0702 07/15/23 0500  Weight: 84.9 kg 84.7 kg 83.5 kg    Telemetry    Sinus with rare isolated PVC  - Personally Reviewed  ECG    EKG personally reviewed by me showed sinus rhythm with nonspecific ST changes.  Physical Exam   GEN: No acute distress.   Neck: No JVD. Cardiac: RRR, no murmurs, rubs, or gallops.  Respiratory: Clear to auscultation bilaterally.  GI: Soft, nontender, non-distended.   MS: Trivial left lower extremity edema, no palpable right stump edema; No deformity. Neuro:  Alert and oriented x 3; Nonfocal.  Psych: Normal affect. Right radial pulse is normal with no hematoma.  Labs    Chemistry Recent Labs  Lab 07/09/23 1152 07/10/23 0452 07/13/23 0518 07/14/23 0629 07/14/23 1118 07/14/23 1125 07/15/23 0302  NA 132*   < > 132* 135 137 137 132*  K 5.3*   < > 3.9 3.6 3.8 3.7 3.6  CL 99   < > 98 98  --   --  96*  CO2 27   < > 30 30  --   --  28  GLUCOSE 288*   < > 97 77  --   --  193*  BUN 21   < > 25* 29*  --   --  26*  CREATININE 1.01   < > 0.90 0.81  --   --  0.94  CALCIUM 9.5   < > 8.7* 9.0  --   --  8.9  PROT 7.2  --   --   --   --   --   --   ALBUMIN 3.5  --   --   --   --   --   --   AST 21  --   --   --   --   --   --   ALT 14  --   --   --   --   --   --   ALKPHOS 83  --   --   --   --   --   --   BILITOT 0.9  --   --   --   --   --   --   GFRNONAA >60   < > >60 >60  --   --  >60  ANIONGAP 6   < > 4* 7  --   --  8   < > = values in this interval not displayed.     Hematology Recent Labs  Lab 07/11/23 0407 07/13/23 0518 07/14/23 1118 07/14/23 1125 07/15/23 0302  WBC 6.5 6.6  --   --  5.6  RBC 4.27 4.17*  --   --  3.99*  HGB 10.6* 10.3* 11.2* 11.2* 9.8*  HCT 33.3* 31.8* 33.0* 33.0* 30.1*  MCV 78.0* 76.3*  --   --  75.4*  MCH 24.8* 24.7*  --   --  24.6*  MCHC 31.8 32.4  --   --  32.6  RDW 15.9* 16.5*  --   --  16.3*  PLT 246 223  --   --  222    Cardiac EnzymesNo results for  input(s): "TROPONINI" in the last 168 hours. No results for input(s): "TROPIPOC" in the last 168 hours.   BNP Recent Labs  Lab 07/09/23 1152  BNP 2,395.3*     DDimer  Recent Labs  Lab 07/10/23 1717  DDIMER 0.60*     Radiology    CARDIAC CATHETERIZATION Result Date: 07/14/2023   Ost RCA to Prox RCA lesion is 100% stenosed.   Ramus lesion is 70% stenosed.   1st Diag lesion is 99% stenosed.   Prox Cx lesion is 95% stenosed.   Dist LM lesion is 30% stenosed.   Mid LAD lesion is 90% stenosed.   Prox LAD to Mid LAD lesion is 80% stenosed.   Ost LAD to Prox LAD lesion is 50% stenosed.   A drug-eluting stent was successfully placed using a STENT ONYX FRONTIER 2.25X38.   A drug-eluting stent was successfully placed using a STENT ONYX FRONTIER 2.5X26.   A drug-eluting stent was successfully placed using a STENT ONYX FRONTIER 2.75X15.   Post intervention, there is a 0% residual stenosis.   Post intervention, there is a 0% residual stenosis.   Post intervention, there is a 0% residual stenosis. 1.  Severe three-vessel coronary artery disease. 2.  Left ventricular angiography was not performed.  EF was severely reduced by echo. 3.  Right heart catheterization showed moderately elevated right and left-sided filling pressures, moderate to severe pulmonary hypertension and moderately reduced cardiac output. 4.  Successful angioplasty  and 3 overlapped drug-eluting stent placement to the LAD.  Extremely difficult procedure due to tortuosity and calcifications.  There was a proximal edge dissection that required adding a third stent all the way back to the ostium of the LAD.  The patient had significant chest pain and hypotension with the telescope catheter in place.  He was started on nitroglycerin drip at the end. Recommendations: I felt that the patient was not a candidate for CABG and thus I proceeded with LAD PCI.  Will transfer the patient to stepdown ICU given chest pain postprocedure and will use  nitroglycerin drip.  Recommend medical therapy for the rest of his coronary artery disease. Continue IV diuresis for heart failure.    Cardiac Studies   2D echo 07/10/2023: 1. Left ventricular ejection fraction, by estimation, is 25 to 30%. The  left ventricle has severely decreased function. The left ventricle  demonstrates regional wall motion abnormalities (see scoring  diagram/findings for description). The left  ventricular internal cavity size was moderately dilated. Left ventricular  diastolic parameters were normal. The global longitudinal strain is  normal.   2. Right ventricular systolic function is normal. The right ventricular  size is normal. There is normal pulmonary artery systolic pressure.   3. The mitral valve is normal in structure. Mild mitral valve  regurgitation. No evidence of mitral stenosis.   4. Tricuspid valve regurgitation is mild to moderate.   5. The aortic valve is normal in structure. Aortic valve regurgitation is  not visualized. Aortic valve sclerosis is present, with no evidence of  aortic valve stenosis.   6. The inferior vena cava is normal in size with greater than 50%  respiratory variability, suggesting right atrial pressure of 3 mmHg.  __________  2D echo 11/14/2022: 1. Left ventricular ejection fraction, by estimation, is 25 to 30%. The  left ventricle has severely decreased function. The left ventricle  demonstrates regional wall motion abnormalities (see scoring  diagram/findings for description). Left ventricular  diastolic parameters were normal.   2. Right ventricular systolic function is normal. The right ventricular  size is normal.   3. The mitral valve is normal in structure. Mild to moderate mitral valve  regurgitation. No evidence of mitral stenosis.   4. Tricuspid valve regurgitation is mild to moderate.   5. The aortic valve is normal in structure. Aortic valve regurgitation is  not visualized. No aortic stenosis is present.    6. The inferior vena cava is normal in size with greater than 50%  respiratory variability, suggesting right atrial pressure of 3 mmHg.  __________  Eugenie Birks MPI 11/10/2018 Gavin [facility]ters): LVEF= 45%   FINDINGS:  Regional wall motion:  demonstrates  hypokinesis of the inferior wall.  The overall quality of the study is fair.   Artifacts noted: no  Left ventricular cavity: normal.   Perfusion Analysis:  SPECT images demonstrate moderate perfusion  abnormality of moderate intensity is present in the inferior region on the  stress images.  Resting images show moderate perfusion abnormality of  moderate intensity of the inferior myocardial region with slight  improvements consistent with previous infarct and/or scar as well as with  minimal amount of myocardial ischemia and reversibility defect type: Mixed     Patient Profile     78 y.o. male with history of CAD status post PCI to the mid RCA with 2 overlapping Xience stents in 2011, HFrEF/ICM, extensive PAD status post bilateral lower extremity percutaneous interventions and most recently a right BKA in 03/2023, DM2,  HTN, HLD, OSA, depression, alcohol use, osteoarthritis, and gout who was previously followed by [facility] Potters and we are seeing for second opinion regarding acute on chronic HFrEF and NSTEMI  Assessment & Plan    1.  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: -Currently on Entresto 24/26 mg twice daily, carvedilol 25 mg twice daily, and has spironolactone 25 mg ordered to be started at this morning (trend potassium with this with recommendation to maintain at goal 4.0) -LVEDP was moderately elevated yesterday but he received further IV diuresis since then and appears to be euvolemic today.  I switched him to oral furosemide 40 mg twice daily. I added Farxiga 10 mg daily.  2.  CAD involving native coronary arteries with NSTEMI: -Status complex PCI and 3 overlapped drug-eluting stent placement to the LAD.  Continue dual antiplatelet therapy  indefinitely.  His right coronary artery is chronically occluded with left-to-right collaterals.  In addition, he has significant stenosis in the proximal left circumflex supplying a small to medium sized territory.  This will be treated medically for now and a staged PCI can be considered as an outpatient if he has residual angina.  3.  HTN: -Blood pressure is well-controlled  4.  HLD: -LDL 16 this admission -Lipitor 80 mg  5.  PAD: -Status post multiple interventions and right BKA in 03/2023 -Remains on clopidogrel 75 mg and atorvastatin 80 mg  6.  DM2: -A1c 8.0 this admission -SSI management per primary service  Will transfer the patient back to progressive unit.       For questions or updates, please contact [facility] Please consult [email or web address] for contact info under Cardiology/STEMI.    Signed, Lorine Bears, MD  [facility] 07/15/2023, 9:19 AM

## 2023-07-15 NOTE — Progress Notes (Signed)
 PT Cancellation Note  Patient Details Name: Manuel [name] MRN: [id number] DOB: 1945/07/11   Cancelled Treatment:    Reason Eval/Treat Not Completed: Other (comment) (Patient unavailable, on the bed side commode with nursing. Will continue with attempts.)  Donna Bernard, PT, MPT  Manuel [name] 07/15/2023, 3:19 PM

## 2023-07-15 NOTE — Plan of Care (Signed)

## 2023-07-15 NOTE — Plan of Care (Signed)
  Problem: Education: Goal: Ability to describe self-care measures that may prevent or decrease complications (Diabetes Survival Skills Education) will improve Outcome: Progressing Goal: Individualized Educational Video(s) Outcome: Progressing   Problem: Coping: Goal: Ability to adjust to condition or change in health will improve Outcome: Progressing   Problem: Fluid Volume: Goal: Ability to maintain a balanced intake and output will improve Outcome: Progressing   Problem: Health Behavior/Discharge Planning: Goal: Ability to identify and utilize available resources and services will improve Outcome: Progressing Goal: Ability to manage health-related needs will improve Outcome: Progressing   Problem: Metabolic: Goal: Ability to maintain appropriate glucose levels will improve Outcome: Progressing   Problem: Nutritional: Goal: Maintenance of adequate nutrition will improve Outcome: Progressing Goal: Progress toward achieving an optimal weight will improve Outcome: Progressing   Problem: Skin Integrity: Goal: Risk for impaired skin integrity will decrease Outcome: Progressing   Problem: Tissue Perfusion: Goal: Adequacy of tissue perfusion will improve Outcome: Progressing   Problem: Education: Goal: Knowledge of General Education information will improve Description: Including pain rating scale, medication(s)/side effects and non-pharmacologic comfort measures Outcome: Progressing   Problem: Health Behavior/Discharge Planning: Goal: Ability to manage health-related needs will improve Outcome: Progressing   Problem: Clinical Measurements: Goal: Ability to maintain clinical measurements within normal limits will improve Outcome: Progressing Goal: Will remain free from infection Outcome: Progressing Goal: Diagnostic test results will improve Outcome: Progressing Goal: Respiratory complications will improve Outcome: Progressing Goal: Cardiovascular complication will  be avoided Outcome: Progressing   Problem: Activity: Goal: Risk for activity intolerance will decrease Outcome: Progressing   Problem: Nutrition: Goal: Adequate nutrition will be maintained Outcome: Progressing   Problem: Coping: Goal: Level of anxiety will decrease Outcome: Progressing   Problem: Elimination: Goal: Will not experience complications related to bowel motility Outcome: Progressing Goal: Will not experience complications related to urinary retention Outcome: Progressing   Problem: Pain Managment: Goal: General experience of comfort will improve and/or be controlled Outcome: Progressing   Problem: Safety: Goal: Ability to remain free from injury will improve Outcome: Progressing   Problem: Skin Integrity: Goal: Risk for impaired skin integrity will decrease Outcome: Progressing   Problem: Education: Goal: Ability to demonstrate management of disease process will improve Outcome: Progressing Goal: Ability to verbalize understanding of medication therapies will improve Outcome: Progressing Goal: Individualized Educational Video(s) Outcome: Progressing   Problem: Activity: Goal: Capacity to carry out activities will improve Outcome: Progressing   Problem: Cardiac: Goal: Ability to achieve and maintain adequate cardiopulmonary perfusion will improve Outcome: Progressing   Problem: Education: Goal: Understanding of CV disease, CV risk reduction, and recovery process will improve Outcome: Progressing Goal: Individualized Educational Video(s) Outcome: Progressing   Problem: Activity: Goal: Ability to return to baseline activity level will improve Outcome: Progressing   Problem: Cardiovascular: Goal: Ability to achieve and maintain adequate cardiovascular perfusion will improve Outcome: Progressing Goal: Vascular access site(s) Level 0-1 will be maintained Outcome: Progressing   Problem: Health Behavior/Discharge Planning: Goal: Ability to safely  manage health-related needs after discharge will improve Outcome: Progressing

## 2023-07-15 NOTE — Discharge Instructions (Signed)
 Intensive Outpatient Programs   [facility] [address] [place],  [place], [place] [phone]  [facility] (Inpatient and [phone] (Suboxone and Methadone) [address] [phone]  [facility] Programs - Intensive Outpatient [address] [place], [place] [address]  [phone]  [facility] (Outpatient, Inpatient, [facility] (Groups and Residental) (insurance only) [phone] [place], [place] [phone]   [facility] (for caregivers and family) [address] Laurell Josephs [address], [place], [place] [phone]  Residential Treatment Programs  [facility](2 years) Residential: 69 days)[facility] [address] [place], [place], [place] [phone] or [phone]  [facility] [address] [place], [place], [place] [phone]  [facility] ([facility] [address] [place], [place], [place] [phone] Admissions: 8am-3pm M-F  [facility]: Residential Program [phone] Days)             [facility]: [facility] [place]  [place], [place], [place]  [phone] or [phone] in Hours over the weekend or by referral)  [facility] [address] [place], [place] [address] [phone] (Do virtual or phone assessment, offer transportation within 25 miles, have in patient and Outpatient options)   [facility] Crisis: [facility]:[phone] (for crisis  response 24 hours a day)

## 2023-07-15 NOTE — Progress Notes (Addendum)
 Daily Progress Note   Patient Name: Manuel [name]       Date: 07/15/2023 DOB: October 11, 1945  Age: 78 y.o. MRN#: [id number] Attending Physician: Pennie Banter, DO Primary Care Physician: Gracelyn Nurse, MD Admit Date: 07/09/2023  Reason for Consultation/Follow-up: Establishing goals of care  Subjective: Notes and labs reviewed.  Into see patient.  He is happy to have undergone cardiac catheterization.  He smiles and laughs, and states he is feeling better.  He is hopeful for acceptable quality of life.  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  He shares his childhood and having been raised by a devout Catholic mother, going to a priest camp as a teenager, and how this affected his life growing up, and his relationship with his mother.  He discusses his values and beliefs.  He discussed being in the [employer], and being inducted into the [employer], and what his life looked like during and after this time.  He discusses being married for 42 years and having 3 children.  Wife entered.  She is happy to see how well he is doing.  Patient advises his son should be coming to visit later this afternoon.  PMT will continue to follow.  Length of Stay: 6  Current Medications: Scheduled Meds:   aspirin  81 mg Oral Daily   atorvastatin  80 mg Oral QHS   carvedilol  25 mg Oral BID WC   Chlorhexidine Gluconate Cloth  6 each Topical Daily   clopidogrel  75 mg Oral Daily   [START ON 07/16/2023] dapagliflozin propanediol  10 mg Oral Daily   enoxaparin (LOVENOX) injection  45 mg Subcutaneous Q24H   feeding supplement  237 mL Oral BID BM   fluticasone  2 spray Each Nare Daily   folic acid  1 mg Oral Daily   furosemide  40 mg Oral BID   insulin aspart  0-5 Units Subcutaneous QHS    insulin aspart  0-9 Units Subcutaneous TID WC   insulin aspart protamine- aspart  15 Units Subcutaneous BID WC   loratadine  10 mg Oral Daily   multivitamin with minerals  1 tablet Oral Daily   sacubitril-valsartan  1 tablet Oral Q12H   sodium chloride flush  3 mL Intravenous Q12H   spironolactone  25  mg Oral Daily   thiamine  100 mg Oral Daily   Or   thiamine  100 mg Intravenous Daily   traZODone  100 mg Oral QHS    Continuous Infusions:   PRN Meds: HYDROmorphone (DILAUDID) injection, sodium chloride, sodium chloride flush, traZODone  Physical Exam Pulmonary:     Effort: Pulmonary effort is normal.  Neurological:     Mental Status: He is alert.             Vital Signs: BP 106/72   Pulse 64   Temp 98.3 F (36.8 C) (Oral)   Resp 17   Ht 5' 7.99" (1.727 m)   Wt 83.5 kg   SpO2 100%   BMI 28.00 kg/m  SpO2: SpO2: 100 % O2 Device: O2 Device: Room Air O2 Flow Rate: O2 Flow Rate (L/min): 2 L/min  Intake/output summary:  Intake/Output Summary (Last 24 hours) at 07/15/2023 1505 Last data filed at 07/15/2023 1411 Gross per 24 hour  Intake 1222.56 ml  Output 1300 ml  Net -77.44 ml   LBM: Last BM Date : 07/13/23 Baseline Weight: Weight: 95.7 kg Most recent weight: Weight: 83.5 kg   Patient Active Problem List   Diagnosis Date Noted   Acute systolic heart failure (HCC) 07/12/2023   NSTEMI (non-ST elevated myocardial infarction) (HCC) 07/09/2023   Insomnia 07/09/2023   DNR (do not resuscitate)/DNI(Do Not Intubate( 05/07/2023   S/P BKA (below knee amputation), right (HCC) - 04-20-2023 05/07/2023   Current severe episode of major depressive disorder without psychotic features (HCC) 04/28/2023   Wound infection 04/25/2023   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) 04/24/2023   Diabetic infection of right foot (HCC) 04/24/2023   Osteomyelitis (HCC) - right foot 04/19/2023   Acute kidney injury superimposed on chronic kidney disease (HCC) 04/19/2023   Chronic  hyponatremia - baseline Na 128-130. 04/19/2023   Essential hypertension 04/19/2023   Dyslipidemia 04/19/2023   BPH (benign prostatic hyperplasia) 04/19/2023   Acute metabolic encephalopathy 04/19/2023   Generalized weakness 02/14/2023   Adjustment disorder with depressed mood 11/14/2022   Chronic combined systolic and diastolic CHF (congestive heart failure) (HCC) 11/12/2022   Left flank pain 06/16/2021   Constipation 06/16/2021   Lower limb ischemia right leg with history of angioplasty and stent on 05/14/2021 06/13/2021   Alcohol abuse 06/07/2021   Coronary artery disease with history of myocardial infarction without history of CABG 05/08/2021   S/P angioplasty with stent 05/08/2021   PAD (peripheral artery disease) (HCC) 05/08/2021   Diabetic polyneuropathy associated with type 2 diabetes mellitus (HCC) 09/25/2017   Lumbar radiculopathy 01/27/2017   Lumbar degenerative disc disease 01/27/2017   Lumbar spondylosis 01/27/2017   Lumbar facet arthropathy 01/27/2017   Insulin dependent type 2 diabetes mellitus (HCC) 10/18/2014   Bilateral carotid artery stenosis 04/05/2014   Microalbuminuria 12/19/2013   Gout 09/21/2013   Mixed hyperlipidemia 09/14/2013    Palliative Care Assessment & Plan   Recommendations/Plan: Patient is happy that he underwent cardiac catheterization.  He states he is feeling better.  He is hopeful for acceptable quality of life.  PMT will follow.   Code Status:    Code Status Orders  (From admission, onward)           Start     Ordered   07/09/23 1401  Do not attempt resuscitation (DNR)- Limited -Do Not Intubate (DNI)  Continuous       Question Answer Comment  If pulseless and not breathing No CPR or chest compressions.  In Pre-Arrest Conditions (Patient Is Breathing and Has A Pulse) Do not intubate. Provide all appropriate non-invasive medical interventions. Avoid ICU transfer unless indicated or required.   Consent: Discussion documented in EHR  or advanced directives reviewed      07/09/23 1400           Code Status History     Date Active Date Inactive Code Status Order ID Comments User Context   05/01/2023 1536 05/08/2023 2313 Limited: Do not attempt resuscitation (DNR) -DNR-LIMITED -Do Not Intubate/DNI  [id number]  Georgiann Cocker, FNP Inpatient   04/30/2023 1058 05/01/2023 1536 Do not attempt resuscitation (DNR) - Comfort care [id number]  Charise Killian, MD Inpatient   04/19/2023 0228 04/30/2023 1058 Limited: Do not attempt resuscitation (DNR) -DNR-LIMITED -Do Not Intubate/DNI  [id number]  Mansy, Vernetta Honey, MD ED   02/14/2023 2011 02/19/2023 2103 Limited: Do not attempt resuscitation (DNR) -DNR-LIMITED -Do Not Intubate/DNI  [id number]  Verdene Lennert, MD ED   01/18/2023 1828 01/19/2023 1832 Limited: Do not attempt resuscitation (DNR) -DNR-LIMITED -Do Not Intubate/DNI  [id number]  Theotis Burrow, NP Inpatient   01/17/2023 0841 01/18/2023 1828 Do not attempt resuscitation (DNR) PRE-ARREST INTERVENTIONS DESIRED [id number]  Gillis Santa, MD Inpatient   01/15/2023 1107 01/17/2023 0840 Full Code [id number]  Floydene Flock, MD ED   11/22/2022 1557 11/26/2022 1830 DNR [id number]  Theotis Burrow, NP Inpatient   11/10/2022 1119 11/22/2022 1556 Full Code [id number]  Floydene Flock, MD ED   06/13/2021 0040 06/17/2021 1803 Full Code [id number]  Andris Baumann, MD ED   05/14/2021 1345 05/14/2021 2028 Full Code [id number]  Annice Needy, MD Inpatient      Advance Directive Documentation    Flowsheet Row Most Recent Value  Type of Advance Directive Healthcare Power of Attorney  Pre-existing out of facility DNR order (yellow form or pink MOST form) --  "MOST" Form in Place? --    Thank you for allowing the Palliative Medicine Team to assist in the care of this patient.  Morton Stall, NP  Please contact Palliative Medicine Team phone at [phone] for questions and concerns.

## 2023-07-15 NOTE — Progress Notes (Signed)
 PROGRESS NOTE    Manuel [name]  ZOX:[id number] DOB: 12-22-1945 DOA: 07/09/2023 PCP: Gracelyn Nurse, MD  Outpatient Specialists: cardiology    Brief Narrative:   From admission h and p  [name] is a 78 year old male with history of CAD, PAD, history of right BKA, hyperlipidemia, hypertension, depression, anxiety, insomnia, who presents to the emergency department for chief concerns of shortness of breath, decreased urine output, and inability to sleep for the last year.   He reports he has been short of breath for about 1 year and the shortness of breath is worse when he lays down at night.  He reports this has caused him difficulty sleeping for 1 year.  He reports that he does get so tired that he is so short of breath when he lays down that he cannot go to sleep.   He reports that shortness of breath has worsened over the last week.  He reports there is also swelling of his lower extremity (left only).  He denies fever, chills, nausea, vomiting, cough, syncope.  He endorses chest pressure/chest discomfort especially when he lays flat at night.  He denies trauma to his person.  Assessment & Plan:   Principal Problem:   NSTEMI (non-ST elevated myocardial infarction) (HCC) Active Problems:   PAD (peripheral artery disease) (HCC)   Generalized weakness   Alcohol abuse   Coronary artery disease with history of myocardial infarction without history of CABG   Insulin dependent type 2 diabetes mellitus (HCC)   Chronic combined systolic and diastolic CHF (congestive heart failure) (HCC)   Chronic hyponatremia - baseline Na 128-130.   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 With chf exacerbation, RWMA on TTE. History of prior stents 3/24 -- R/L cath today, 3 LAD stents placed --Mgmt per Cardiology --Transferred to ICU post-cath for nitroglycerin drip due to chest pain during procedure --Off IV heparin  --ASA/Plavix, Lipitor, Coreg  Acute on chronic HFrEF  2/2 ischemic cardiomyopathy. EF 25%. Net negative 1.8L. Down 8lbs since 3/19.  Remains volume overloaded.  --Mgmt per Cardiology --Transitioned from IV >> PO Lasix 40 mg BID -- per cardiology --Continue entreso, coreg, aldactone  Mild hyponatremia Likely 2/2 hypervolemia  Continue to monitor with IV diuresis   Goals of care Patient reports declining health last 1-2 years, is somewhat interested in a comfort-based approach - palliative consulted  PAD S/p right BKA in Dec 2024.   ABI of the LLE shows borderline PAD - outpt vascular f/u - home statin, plavix  EtOH use Reports drinking most days, vague on amount, says last drink a week ago, denies hx withdrawal - monitor  Insomnia Improved w/ diuresis likely component of  orthopnea/pnd likely contributory - home trazodone, mirtazapine  Type 2 diabetes - uncontrolled - A1c 8.0% --Sliding Scaleo Novolog --home 70/30, is on 30u bid at home, Continue at 15u bid --titrate regimen  Microcytic anemia  No gross bleeding noted.  Will check iron panel.     DVT prophylaxis: Lovenox  Code Status: dnr/dni  Family Communication: none at bedside.   Pt is able to udpate.  Level of care: Progressive Status is: Inpatient  Remains inpatient appropriate because: pending clearance for d/c by cardiology    Consultants:  Cardiology Palliative Care  Procedures: Right/Left cardiac cath 3/24 -- see report  Antimicrobials:  none    Subjective: Pt seen sleeping, woke to voice.  Denies chest pain or any other complaints.   Objective: Vitals:   07/15/23 0900 07/15/23 1000 07/15/23 1100 07/15/23 1210  BP:  103/74 (!) 129/100 129/81 108/66  Pulse:  70 65 68  Resp: (!) 22 11 15  (!) 23  Temp:    98.3 F (36.8 C)  TempSrc:    Oral  SpO2:   100% 100%  Weight:      Height:        Intake/Output Summary (Last 24 hours) at 07/15/2023 1317 Last data filed at 07/15/2023 0900 Gross per 24 hour  Intake 887.39 ml  Output 1550 ml  Net -662.61 ml   Filed Weights   07/13/23 0512 07/14/23 0702 07/15/23 0500  Weight: 84.9 kg 84.7 kg 83.5 kg    Examination:  General exam: sleeping, woke to voice, no acute distress Respiratory system: on room air, normal respiratory effort. Cardiovascular system: RRR, +LLE edema.   Gastrointestinal system: soft, NT, ND Central nervous system:no gross focal neurologic deficits, normal speech Extremities: R BKA, LLE edema Psychiatry: normal mood, congruent affec   Data Reviewed:   Notable labs --  Na 132, Cl 96, glucose 193, BUN 26, Hbg 9.8      LOS: 6 days     Pennie Banter, DO [facility]   If 7PM-7AM, please contact night-coverage [email or web address] Password [email or web address] 07/15/2023, 1:17 PM

## 2023-07-15 NOTE — Progress Notes (Signed)
 Complaints of insomnia, he states he's been awake for a majority of the night . Patient states insomnia is a huge issue for him and no provider is addressing his needs appropriately. He has taken trazodone tonight without any improvement . Informed night coverage

## 2023-07-15 NOTE — Progress Notes (Incomplete)
 Heart Failure Stewardship Pharmacy Note  PCP: Gracelyn Nurse, MD PCP-Cardiologist: None  HPI: Manuel [name] is a 78 y.o. male with CAD, PAD s/p right BKA, gout, hyperlipidemia, hypertension, depression, anxiety, insomnia  who presented with insomnia, shortness of breath, and decreased urine output. On admission, BNP was 2395.3, HS-troponin was 1045 > 944 > 895, and A1c was 8. Chest x-ray noted mild diffuse vascular congestion, bilateral trace pleural effusions, concerning for CHF.   Pertinent cardiac history: CAD s/p PCI to [facility] with overlapping stents in 2011. extensive PAD s/p PTA L common Iliac, R peroneal, R distal SFA/prox popliteal, and stent to R distal SFA/prox popliteal arteries (05/14/21). Echo in 10/2022 with LVEF of 25-30%. Echo 06/2023 with LVEF 25-30%, moderately dilated LV, and mild valvular disease.  Pertinent Lab Values: Creatinine  Date Value Ref Range Status  02/16/2012 0.72 0.60 - 1.30 mg/dL Final   Creatinine, Ser  Date Value Ref Range Status  07/15/2023 0.94 0.61 - 1.24 mg/dL Final   BUN  Date Value Ref Range Status  07/15/2023 26 (H) 8 - 23 mg/dL Final  02/72/5366 10 7 - 18 mg/dL Final   Potassium  Date Value Ref Range Status  07/15/2023 3.6 3.5 - 5.1 mmol/L Final  02/16/2012 3.8 3.5 - 5.1 mmol/L Final   Sodium  Date Value Ref Range Status  07/15/2023 132 (L) 135 - 145 mmol/L Final  02/16/2012 137 136 - 145 mmol/L Final   B Natriuretic Peptide  Date Value Ref Range Status  07/09/2023 2,395.3 (H) 0.0 - 100.0 pg/mL Final    Comment:    Performed at [facility], [address] [place], [place] [address]   Magnesium  Date Value Ref Range Status  07/15/2023 1.8 1.7 - 2.4 mg/dL Final    Comment:    Performed at [facility], [address] [place], [place] [address]   Hgb A1c MFr Bld  Date Value Ref Range Status  07/10/2023 8.0 (H) 4.8 - 5.6 % Final    Comment:    (NOTE) Pre diabetes:          5.7%-6.4%  Diabetes:               >6.4%  Glycemic control for   <7.0% adults with diabetes    TSH  Date Value Ref Range Status  04/19/2023 1.416 0.350 - 4.500 uIU/mL Final    Comment:    Performed by a 3rd Generation assay with a functional sensitivity of <=0.01 uIU/mL. Performed at [facility], [address] [place], [place] [address]     Vital Signs: Admission weight: Temp:  [97.2 F (36.2 C)-98.2 F (36.8 C)] 97.2 F (36.2 C) (03/25 0800) Pulse Rate:  [59-85] 73 (03/25 0828) Cardiac Rhythm: Normal sinus rhythm (03/25 0805) Resp:  [11-26] 23 (03/25 0800) BP: (75-168)/(47-112) 120/64 (03/25 0828) SpO2:  [89 %-100 %] 94 % (03/25 0800) FiO2 (%):  [21 %] 21 % (03/24 2024) Weight:  [83.5 kg (184 lb 1.4 oz)] 83.5 kg (184 lb 1.4 oz) (03/25 0500)  Intake/Output Summary (Last 24 hours) at 07/15/2023 1009 Last data filed at 07/15/2023 0500 Gross per 24 hour  Intake 527.39 ml  Output 1550 ml  Net -1022.61 ml    Current Heart Failure Medications:  Loop diuretic: Beta-Blocker: ACEI/ARB/ARNI: MRA: SGLT2i: Other:  Prior to admission Heart Failure Medications:  Loop diuretic: furosemide 20 mg x 10 days Beta-Blocker: carvedilol 25 mg BID ACEI/ARB/ARNI: Entresto 24-26 mg BID MRA: spironolactone 50 mg daily SGLT2i:  Other:  Assessment: 1. {CHL AMB Acute or Acute on chronic:210917277} {Systolic/diastolic/combined systolic/diastolic:210917278} heart failure (LVEF ***%) ***, due to ***. NYHA class *** symptoms.   - Plan: 1) Medication changes recommended at this time:  2) Patient assistance:   3) Education: -To be completed prior to discharge.  *** Medication Assistance / Insurance Benefits Check: Does the patient have prescription insurance?    Type of insurance plan:  Does the patient qualify for medication assistance through manufacturers or grants? {CHL AMB Yes/No/Pending:210917269}  Eligible grants and/or patient assistance programs: ***  Medication assistance applications  in progress: ***  Medication assistance applications approved: *** Approved medication assistance renewals will be completed by: ***  Outpatient Pharmacy: Prior to admission outpatient pharmacy: ***      ***

## 2023-07-16 DIAGNOSIS — Z95820 Peripheral vascular angioplasty status with implants and grafts: Secondary | ICD-10-CM

## 2023-07-16 DIAGNOSIS — R079 Chest pain, unspecified: Secondary | ICD-10-CM

## 2023-07-16 DIAGNOSIS — I739 Peripheral vascular disease, unspecified: Secondary | ICD-10-CM | POA: Diagnosis not present

## 2023-07-16 DIAGNOSIS — Z7189 Other specified counseling: Secondary | ICD-10-CM | POA: Diagnosis not present

## 2023-07-16 DIAGNOSIS — I25110 Atherosclerotic heart disease of native coronary artery with unstable angina pectoris: Secondary | ICD-10-CM

## 2023-07-16 DIAGNOSIS — I5043 Acute on chronic combined systolic (congestive) and diastolic (congestive) heart failure: Secondary | ICD-10-CM | POA: Diagnosis not present

## 2023-07-16 DIAGNOSIS — I214 Non-ST elevation (NSTEMI) myocardial infarction: Secondary | ICD-10-CM | POA: Diagnosis not present

## 2023-07-16 DIAGNOSIS — I10 Essential (primary) hypertension: Secondary | ICD-10-CM

## 2023-07-16 LAB — BASIC METABOLIC PANEL
Anion gap: 9 (ref 5–15)
BUN: 32 mg/dL — ABNORMAL HIGH (ref 8–23)
CO2: 27 mmol/L (ref 22–32)
Calcium: 9 mg/dL (ref 8.9–10.3)
Chloride: 97 mmol/L — ABNORMAL LOW (ref 98–111)
Creatinine, Ser: 1.22 mg/dL (ref 0.61–1.24)
GFR, Estimated: >60 mL/min (ref >60)
Glucose, Bld: 122 mg/dL — ABNORMAL HIGH (ref 70–99)
Potassium: 3.9 mmol/L (ref 3.5–5.1)
Sodium: 133 mmol/L — ABNORMAL LOW (ref 135–145)

## 2023-07-16 LAB — GLUCOSE, CAPILLARY
Glucose-Capillary: 117 mg/dL — ABNORMAL HIGH (ref 70–99)
Glucose-Capillary: 237 mg/dL — ABNORMAL HIGH (ref 70–99)

## 2023-07-16 LAB — CBC
HCT: 30.7 % — ABNORMAL LOW (ref 39.0–52.0)
Hemoglobin: 10 g/dL — ABNORMAL LOW (ref 13.0–17.0)
MCH: 24.6 pg — ABNORMAL LOW (ref 26.0–34.0)
MCHC: 32.6 g/dL (ref 30.0–36.0)
MCV: 75.6 fL — ABNORMAL LOW (ref 80.0–100.0)
Platelets: 222 10*3/uL (ref 150–400)
RBC: 4.06 MIL/uL — ABNORMAL LOW (ref 4.22–5.81)
RDW: 16.1 % — ABNORMAL HIGH (ref 11.5–15.5)
WBC: 5.4 10*3/uL (ref 4.0–10.5)
nRBC: 0 % (ref 0.0–0.2)

## 2023-07-16 LAB — MAGNESIUM: Magnesium: 2 mg/dL (ref 1.7–2.4)

## 2023-07-16 LAB — LIPOPROTEIN A (LPA): Lipoprotein (a): 37.2 nmol/L — ABNORMAL HIGH (ref <75.0)

## 2023-07-16 MED ORDER — DAPAGLIFLOZIN PROPANEDIOL 10 MG PO TABS: 10.0000 mg | ORAL_TABLET | Freq: Every day | ORAL | 2 refills | Status: DC

## 2023-07-16 MED ORDER — SPIRONOLACTONE 25 MG PO TABS: 25.0000 mg | ORAL_TABLET | Freq: Every day | ORAL | 2 refills | Status: DC

## 2023-07-16 MED ORDER — FUROSEMIDE 40 MG PO TABS: 40.0000 mg | ORAL_TABLET | Freq: Every day | ORAL | 2 refills | Status: DC

## 2023-07-16 MED ORDER — POTASSIUM CHLORIDE CRYS ER 20 MEQ PO TBCR
20.0000 meq | EXTENDED_RELEASE_TABLET | Freq: Once | ORAL | Status: DC
Start: 2023-07-16 — End: 2023-07-16

## 2023-07-16 MED ORDER — ATORVASTATIN CALCIUM 80 MG PO TABS: 80.0000 mg | ORAL_TABLET | Freq: Every day | ORAL | 6 refills | Status: DC

## 2023-07-16 MED ORDER — NITROGLYCERIN 0.4 MG SL SUBL: 0.4000 mg | SUBLINGUAL_TABLET | SUBLINGUAL | 3 refills | Status: DC | PRN

## 2023-07-16 MED ORDER — FUROSEMIDE 20 MG PO TABS: 40.0000 mg | ORAL_TABLET | Freq: Every day | ORAL | Status: DC

## 2023-07-16 MED ORDER — CLOPIDOGREL BISULFATE 75 MG PO TABS: 75.0000 mg | ORAL_TABLET | Freq: Every day | ORAL | 11 refills | Status: DC

## 2023-07-16 NOTE — Progress Notes (Addendum)
 Daily Progress Note   Patient Name: Manuel [name]       Date: 07/16/2023 DOB: Oct 21, 1945  Age: 78 y.o. MRN#: [id number] Attending Physician: Loyce Dys, MD Primary Care Physician: Gracelyn Nurse, MD Admit Date: 07/09/2023  Reason for Consultation/Follow-up: Establishing goals of care  Subjective: Notes and labs reviewed.  In to see patient.  He is currently sitting on side of the bed and smiles upon my entry.   Discussed that a new PCP appointment has been made for him; he is grateful for this.  Discussed cardiac rehab and care moving forward.  He is excited to try to improve his status. He is hopeful to get his lower extremity prosthesis soon; epic chat with VS who is trying to expedite prosthesis with [employer].  He discusses the need for another BiPAP machine for sleep apnea; epic chat with attending. He discusses that he cannot afford Entresto; epic chat with cards and attending.  Length of Stay: 7  Current Medications: Scheduled Meds:   aspirin  81 mg Oral Daily   atorvastatin  80 mg Oral QHS   carvedilol  25 mg Oral BID WC   Chlorhexidine Gluconate Cloth  6 each Topical Daily   clopidogrel  75 mg Oral Daily   dapagliflozin propanediol  10 mg Oral Daily   enoxaparin (LOVENOX) injection  45 mg Subcutaneous Q24H   feeding supplement  237 mL Oral BID BM   fluticasone  2 spray Each Nare Daily   folic acid  1 mg Oral Daily   [START ON 07/17/2023] furosemide  40 mg Oral Daily   insulin aspart  0-5 Units Subcutaneous QHS   insulin aspart  0-9 Units Subcutaneous TID WC   insulin aspart protamine- aspart  15 Units Subcutaneous BID WC   loratadine  10 mg Oral Daily   multivitamin with minerals  1 tablet Oral Daily   sacubitril-valsartan  1 tablet Oral Q12H   sodium chloride  flush  3 mL Intravenous Q12H   spironolactone  25 mg Oral Daily   thiamine  100 mg Oral Daily   Or   thiamine  100 mg Intravenous Daily   traZODone  100 mg Oral QHS    Continuous Infusions:   PRN Meds: HYDROmorphone (DILAUDID) injection, sodium chloride, sodium chloride flush, traZODone  Physical Exam Pulmonary:  Effort: Pulmonary effort is normal.  Neurological:     Mental Status: He is alert.             Vital Signs: BP 122/80 (BP Location: Left Arm)   Pulse 69   Temp 97.8 F (36.6 C) (Oral)   Resp 20   Ht 5' 7.99" (1.727 m)   Wt 86 kg   SpO2 94%   BMI 28.83 kg/m  SpO2: SpO2: 94 % O2 Device: O2 Device: Room Air O2 Flow Rate: O2 Flow Rate (L/min): 2 L/min  Intake/output summary:  Intake/Output Summary (Last 24 hours) at 07/16/2023 1213 Last data filed at 07/16/2023 1206 Gross per 24 hour  Intake 500 ml  Output 1050 ml  Net -550 ml   LBM: Last BM Date : 07/13/23 Baseline Weight: Weight: 95.7 kg Most recent weight: Weight: 86 kg      Patient Active Problem List   Diagnosis Date Noted   Acute systolic heart failure (HCC) 07/12/2023   NSTEMI (non-ST elevated myocardial infarction) (HCC) 07/09/2023   Insomnia 07/09/2023   DNR (do not resuscitate)/DNI(Do Not Intubate( 05/07/2023   S/P BKA (below knee amputation), 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 04/19/2023   Essential hypertension 04/19/2023   Dyslipidemia 04/19/2023   BPH (benign prostatic hyperplasia) 04/19/2023   Acute metabolic encephalopathy 04/19/2023   Generalized weakness 02/14/2023    Adjustment disorder with depressed mood 11/14/2022   Chronic combined systolic and diastolic CHF (congestive heart failure) (HCC) 11/12/2022   Left flank pain 06/16/2021   Constipation 06/16/2021   Lower limb ischemia right leg with history of angioplasty and stent on 05/14/2021 06/13/2021   Alcohol abuse 06/07/2021   Coronary artery disease with history of myocardial infarction without history of CABG 05/08/2021   S/P angioplasty with stent 05/08/2021   PAD (peripheral artery disease) (HCC) 05/08/2021   Diabetic polyneuropathy associated with type 2 diabetes mellitus (HCC) 09/25/2017   Lumbar radiculopathy 01/27/2017   Lumbar degenerative disc disease 01/27/2017   Lumbar spondylosis 01/27/2017   Lumbar facet arthropathy 01/27/2017   Insulin dependent type 2 diabetes mellitus (HCC) 10/18/2014   Bilateral carotid artery stenosis 04/05/2014   Microalbuminuria 12/19/2013   Gout 09/21/2013   Mixed hyperlipidemia 09/14/2013    Palliative Care Assessment & Plan    Recommendations/Plan: PMT will follow Communicated concerns with attending, and vascular surgery  Code Status:    Code Status Orders  (From admission, onward)           Start     Ordered   07/09/23 1401  Do not attempt resuscitation (DNR)- Limited -Do Not Intubate (DNI)  Continuous       Question Answer Comment  If pulseless and not breathing No CPR or chest compressions.   In Pre-Arrest Conditions (Patient Is Breathing and Has A Pulse) Do not intubate. Provide all appropriate non-invasive medical interventions. Avoid ICU transfer unless indicated or required.   Consent: Discussion documented in EHR or advanced directives reviewed      07/09/23 1400           Code Status History     Date Active Date Inactive Code  Status Order ID Comments User Context   05/01/2023 1536 05/08/2023 2313 Limited: Do not attempt resuscitation (DNR) -DNR-LIMITED -Do Not Intubate/DNI  [id number]  Georgiann Cocker, FNP Inpatient    04/30/2023 1058 05/01/2023 1536 Do not attempt resuscitation (DNR) - Comfort care [id number]  Charise Killian, MD Inpatient   04/19/2023 0228 04/30/2023 1058 Limited: Do not attempt resuscitation (DNR) -DNR-LIMITED -Do Not Intubate/DNI  [id number]  Mansy, Vernetta Honey, MD ED   02/14/2023 2011 02/19/2023 2103 Limited: Do not attempt resuscitation (DNR) -DNR-LIMITED -Do Not Intubate/DNI  [id number]  Verdene Lennert, MD ED   01/18/2023 1828 01/19/2023 1832 Limited: Do not attempt resuscitation (DNR) -DNR-LIMITED -Do Not Intubate/DNI  [id number]  Theotis Burrow, NP Inpatient   01/17/2023 0841 01/18/2023 1828 Do not attempt resuscitation (DNR) PRE-ARREST INTERVENTIONS DESIRED [id number]  Gillis Santa, MD Inpatient   01/15/2023 1107 01/17/2023 0840 Full Code [id number]  Floydene Flock, MD ED   11/22/2022 1557 11/26/2022 1830 DNR [id number]  Theotis Burrow, NP Inpatient   11/10/2022 1119 11/22/2022 1556 Full Code [id number]  Floydene Flock, MD ED   06/13/2021 0040 06/17/2021 1803 Full Code [id number]  Andris Baumann, MD ED   05/14/2021 1345 05/14/2021 2028 Full Code [id number]  Annice Needy, MD Inpatient      Advance Directive Documentation    Flowsheet Row Most Recent Value  Type of Advance Directive Healthcare Power of Attorney  Pre-existing out of facility DNR order (yellow form or pink MOST form) --  "MOST" Form in Place? --        Morton Stall, NP  Please contact Palliative Medicine Team phone at [phone] for questions and concerns.

## 2023-07-16 NOTE — Progress Notes (Signed)
 Progress Note  Patient Name: Manuel [name] Date of Encounter: 07/16/2023  Primary Cardiologist: Previously followed by [facility] 2nd opinion   Subjective   No chest pain or dyspnea. Struggled with CPAP at night. Has an upcoming sleep study. Previously on CPAP. BUN/SCr starting to trend up.   Inpatient Medications    Scheduled Meds:  aspirin  81 mg Oral Daily   atorvastatin  80 mg Oral QHS   carvedilol  25 mg Oral BID WC   Chlorhexidine Gluconate Cloth  6 each Topical Daily   clopidogrel  75 mg Oral Daily   dapagliflozin propanediol  10 mg Oral Daily   enoxaparin (LOVENOX) injection  45 mg Subcutaneous Q24H   feeding supplement  237 mL Oral BID BM   fluticasone  2 spray Each Nare Daily   folic acid  1 mg Oral Daily   furosemide  40 mg Oral BID   insulin aspart  0-5 Units Subcutaneous QHS   insulin aspart  0-9 Units Subcutaneous TID WC   insulin aspart protamine- aspart  15 Units Subcutaneous BID WC   loratadine  10 mg Oral Daily   multivitamin with minerals  1 tablet Oral Daily   sacubitril-valsartan  1 tablet Oral Q12H   sodium chloride flush  3 mL Intravenous Q12H   spironolactone  25 mg Oral Daily   thiamine  100 mg Oral Daily   Or   thiamine  100 mg Intravenous Daily   traZODone  100 mg Oral QHS   Continuous Infusions:   PRN Meds: HYDROmorphone (DILAUDID) injection, sodium chloride, sodium chloride flush, traZODone   Vital Signs    Vitals:   07/16/23 0300 07/16/23 0400 07/16/23 0500 07/16/23 0800  BP: 111/62 108/62 117/65 122/80  Pulse: 63 62 65 69  Resp: (!) 24 16 (!) 0 20  Temp:    97.8 F (36.6 C)  TempSrc:    Oral  SpO2: 92% 92% 100% 94%  Weight:   86 kg   Height:        Intake/Output Summary (Last 24 hours) at 07/16/2023 0837 Last data filed at 07/16/2023 0600 Gross per 24 hour  Intake 860 ml  Output 600 ml  Net 260 ml   Filed Weights   07/14/23 0702 07/15/23 0500 07/16/23 0500  Weight: 84.7 kg 83.5 kg 86 kg     Telemetry    Sinus with rates in the 60s to 70s bpm  - Personally Reviewed  ECG    No new tracings  Physical Exam   GEN: No acute distress.   Neck: No JVD. Cardiac: RRR, no murmurs, rubs, or gallops. Right radial pulse 2+ without hematoma noted.  Respiratory: Clear to auscultation bilaterally.  GI: Soft, nontender, non-distended.   MS: Trivial left lower extremity edema, no palpable right stump edema; No deformity. Neuro:  Alert and oriented x 3; Nonfocal.  Psych: Normal affect.  Labs    Chemistry Recent Labs  Lab 07/09/23 1152 07/10/23 0452 07/14/23 0629 07/14/23 1118 07/14/23 1125 07/15/23 0302 07/16/23 0420  NA 132*   < > 135   < > 137 132* 133*  K 5.3*   < > 3.6   < > 3.7 3.6 3.9  CL 99   < > 98  --   --  96* 97*  CO2 27   < > 30  --   --  28 27  GLUCOSE 288*   < > 77  --   --  193* 122*  BUN 21   < > 29*  --   --  26* 32*  CREATININE 1.01   < > 0.81  --   --  0.94 1.22  CALCIUM 9.5   < > 9.0  --   --  8.9 9.0  PROT 7.2  --   --   --   --   --   --   ALBUMIN 3.5  --   --   --   --   --   --   AST 21  --   --   --   --   --   --   ALT 14  --   --   --   --   --   --   ALKPHOS 83  --   --   --   --   --   --   BILITOT 0.9  --   --   --   --   --   --   GFRNONAA >60   < > >60  --   --  >60 >60  ANIONGAP 6   < > 7  --   --  8 9   < > = values in this interval not displayed.     Hematology Recent Labs  Lab 07/13/23 0518 07/14/23 1118 07/14/23 1125 07/15/23 0302 07/16/23 0420  WBC 6.6  --   --  5.6 5.4  RBC 4.17*  --   --  3.99* 4.06*  HGB 10.3*   < > 11.2* 9.8* 10.0*  HCT 31.8*   < > 33.0* 30.1* 30.7*  MCV 76.3*  --   --  75.4* 75.6*  MCH 24.7*  --   --  24.6* 24.6*  MCHC 32.4  --   --  32.6 32.6  RDW 16.5*  --   --  16.3* 16.1*  PLT 223  --   --  222 222   < > = values in this interval not displayed.    Cardiac EnzymesNo results for input(s): "TROPONINI" in the last 168 hours. No results for input(s): "TROPIPOC" in the last 168 hours.    BNP Recent Labs  Lab 07/09/23 1152  BNP 2,395.3*     DDimer  Recent Labs  Lab 07/10/23 1717  DDIMER 0.60*     Radiology     Cardiac Studies   R/LHC 07/14/2023:   Ost RCA to Prox RCA lesion is 100% stenosed.   Ramus lesion is 70% stenosed.   1st Diag lesion is 99% stenosed.   Prox Cx lesion is 95% stenosed.   Dist LM lesion is 30% stenosed.   Mid LAD lesion is 90% stenosed.   Prox LAD to Mid LAD lesion is 80% stenosed.   Ost LAD to Prox LAD lesion is 50% stenosed.   A drug-eluting stent was successfully placed using a STENT ONYX FRONTIER 2.25X38.   A drug-eluting stent was successfully placed using a STENT ONYX FRONTIER 2.5X26.   A drug-eluting stent was successfully placed using a STENT ONYX FRONTIER 2.75X15.   Post intervention, there is a 0% residual stenosis.   Post intervention, there is a 0% residual stenosis.   Post intervention, there is a 0% residual stenosis.   1.  Severe three-vessel coronary artery disease. 2.  Left ventricular angiography was not performed.  EF was severely reduced by echo. 3.  Right heart catheterization showed moderately elevated right and left-sided filling pressures, moderate to severe pulmonary hypertension and moderately reduced cardiac  output. 4.  Successful angioplasty and 3 overlapped drug-eluting stent placement to the LAD.  Extremely difficult procedure due to tortuosity and calcifications.  There was a proximal edge dissection that required adding a third stent all the way back to the ostium of the LAD.  The patient had significant chest pain and hypotension with the telescope catheter in place.  He was started on nitroglycerin drip at the end.   Recommendations: I felt that the patient was not a candidate for CABG and thus I proceeded with LAD PCI.  Will transfer the patient to stepdown ICU given chest pain postprocedure and will use nitroglycerin drip.  Recommend medical therapy for the rest of his coronary artery  disease. Continue IV diuresis for heart failure. __________  2D 07/10/2023: 1. Left ventricular ejection fraction, by estimation, is 25 to 30%. The  left ventricle has severely decreased function. The left ventricle  demonstrates regional wall motion abnormalities (see scoring  diagram/findings for description). The left  ventricular internal cavity size was moderately dilated. Left ventricular  diastolic parameters were normal. The global longitudinal strain is  normal.   2. Right ventricular systolic function is normal. The right ventricular  size is normal. There is normal pulmonary artery systolic pressure.   3. The mitral valve is normal in structure. Mild mitral valve  regurgitation. No evidence of mitral stenosis.   4. Tricuspid valve regurgitation is mild to moderate.   5. The aortic valve is normal in structure. Aortic valve regurgitation is  not visualized. Aortic valve sclerosis is present, with no evidence of  aortic valve stenosis.   6. The inferior vena cava is normal in size with greater than 50%  respiratory variability, suggesting right atrial pressure of 3 mmHg.  __________  2D echo 11/14/2022: 1. Left ventricular ejection fraction, by estimation, is 25 to 30%. The  left ventricle has severely decreased function. The left ventricle  demonstrates regional wall motion abnormalities (see scoring  diagram/findings for description). Left ventricular  diastolic parameters were normal.   2. Right ventricular systolic function is normal. The right ventricular  size is normal.   3. The mitral valve is normal in structure. Mild to moderate mitral valve  regurgitation. No evidence of mitral stenosis.   4. Tricuspid valve regurgitation is mild to moderate.   5. The aortic valve is normal in structure. Aortic valve regurgitation is  not visualized. No aortic stenosis is present.   6. The inferior vena cava is normal in size with greater than 50%  respiratory variability,  suggesting right atrial pressure of 3 mmHg.  __________   Eugenie Birks MPI 11/10/2018 [facility]): LVEF= 45%   FINDINGS:  Regional wall motion:  demonstrates  hypokinesis of the inferior wall.  The overall quality of the study is fair.   Artifacts noted: no  Left ventricular cavity: normal.   Perfusion Analysis:  SPECT images demonstrate moderate perfusion  abnormality of moderate intensity is present in the inferior region on the  stress images.  Resting images show moderate perfusion abnormality of  moderate intensity of the inferior myocardial region with slight  improvements consistent with previous infarct and/or scar as well as with  minimal amount of myocardial ischemia and reversibility defect type: Mixed    minimal amount of myocardial ischemia and reversibility defect type: Mixed     Patient Profile     78 y.o. male with history of CAD status post PCI to the mid RCA with 2 overlapping Xience stents in 2011, HFrEF/ICM, extensive PAD status  post bilateral lower extremity percutaneous interventions and most recently a right BKA in 03/2023, DM2, HTN, HLD, OSA, depression, alcohol use, osteoarthritis, and gout who was previously followed by [facility] and we are seeing for second opinion regarding acute on chronic HFrEF and NSTEMI  Assessment & Plan    1.  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: -Currently on Entresto 24/26 mg twice daily, carvedilol 25 mg twice daily, spironolactone 25 mg, Faxiga 10 mg, with recommendation to reduce Lasix to 40 mg daily given up trending BUN/SCr  -LVEDP was moderately elevated, but he received further IV diuresis since then and appears to be euvolemic.   -CHF education.   -Would benefit from advanced heart failure follow up in the outpatient setting.   2.  CAD involving native coronary arteries with NSTEMI: -Status complex PCI and 3 overlapped drug-eluting stent placement to the LAD.   -Continue dual antiplatelet therapy with aspirin 81 mg and Plavix 75 mg  indefinitely.   -His RCA is chronically occluded with left-to-right collaterals.  In addition, he has significant stenosis in the proximal LCx supplying a small to medium sized territory.  This will be treated medically for now and a staged PCI can be considered as an outpatient if he has residual angina.  -Consider addition of Imdur if he has recurrent angina.   -As needed SL NTG. -Cardiac rehab.   -Post cath instructions.   3.  HTN: -Blood pressure is well-controlled. -Pharmacotherapy as above.  4.  HLD: -LDL 16 this admission. -Lipitor 80 mg.  5.  PAD: -Status post multiple interventions and right BKA in 03/2023. -Remains on clopidogrel 75 mg and atorvastatin 80 mg.  6.  DM2: -A1c 8.0 this admission. -SSI management per primary service.    For questions or updates, please contact [facility] Please consult [email or web address] for contact info under Cardiology/STEMI.    Signed, Eula Listen, PA-C [facility] 07/16/2023, 8:37 AM

## 2023-07-16 NOTE — Plan of Care (Signed)
  Problem: Education: Goal: Ability to describe self-care measures that may prevent or decrease complications (Diabetes Survival Skills Education) will improve Outcome: Progressing   Problem: Fluid Volume: Goal: Ability to maintain a balanced intake and output will improve Outcome: Progressing   Problem: Health Behavior/Discharge Planning: Goal: Ability to manage health-related needs will improve Outcome: Progressing   Problem: Nutritional: Goal: Progress toward achieving an optimal weight will improve Outcome: Progressing   Problem: Skin Integrity: Goal: Risk for impaired skin integrity will decrease Outcome: Progressing   Problem: Tissue Perfusion: Goal: Adequacy of tissue perfusion will improve Outcome: Progressing   Problem: Education: Goal: Knowledge of General Education information will improve Description: Including pain rating scale, medication(s)/side effects and non-pharmacologic comfort measures Outcome: Progressing   Problem: Health Behavior/Discharge Planning: Goal: Ability to manage health-related needs will improve Outcome: Progressing   Problem: Clinical Measurements: Goal: Ability to maintain clinical measurements within normal limits will improve Outcome: Progressing Goal: Diagnostic test results will improve Outcome: Progressing Goal: Respiratory complications will improve Outcome: Progressing Goal: Cardiovascular complication will be avoided Outcome: Progressing   Problem: Coping: Goal: Level of anxiety will decrease Outcome: Progressing   Problem: Safety: Goal: Ability to remain free from injury will improve Outcome: Progressing   Problem: Education: Goal: Ability to verbalize understanding of medication therapies will improve Outcome: Progressing   Problem: Cardiovascular: Goal: Vascular access site(s) Level 0-1 will be maintained Outcome: Progressing   Problem: Health Behavior/Discharge Planning: Goal: Ability to safely manage  health-related needs after discharge will improve Outcome: Progressing

## 2023-07-16 NOTE — Discharge Summary (Signed)
 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   EtOH use Insomnia Type 2 diabetes  Microcytic anemia     Hospital Course:  [name] is a 78 year old male with history of CAD, PAD, history of right BKA, hyperlipidemia, hypertension, depression, anxiety, insomnia, who presents to the emergency department for chief concerns of shortness of breath, decreased urine output, and inability to sleep for the last year.   He reports he has been short of breath for about 1 year and the shortness of breath is worse when he lays down at night.  He reports this has caused him difficulty sleeping for 1 year.  He reports that he does get so tired that he is so short of breath when he lays down that he cannot go to sleep.  Patient was found to have NSTEMI with peak troponin of 1045. Patient underwent catheterization on July 14, 2023 Stenting x 3 to the LAD, residual proximal left circumflex disease, chronically occluded RCA.  In addition, he has significant stenosis in the proximal LCx supplying a small to medium sized territory.  This will be treated medically for now and a staged PCI can be considered as an outpatient if he has residual angina as recommended by cardiologist. Patient has therefore been cleared for discharge today and will follow-up with cardiologist Consultants: Cardiology Procedures performed: As mentioned above Disposition: Home Diet recommendation:  Cardiac diet DISCHARGE MEDICATION: Allergies as of 07/16/2023   No Known Allergies      Medication List     STOP taking these medications    DULoxetine 20 MG capsule Commonly  known as: CYMBALTA   tamsulosin 0.4 MG Caps capsule Commonly known as: FLOMAX       TAKE these medications    aspirin EC 81 MG tablet Take 1 tablet (81 mg total) by mouth daily.   atorvastatin 80 MG tablet Commonly known as: LIPITOR Take 1 tablet (80 mg total) by mouth at bedtime.   carvedilol 25 MG tablet Commonly known as: COREG Take 25 mg by mouth 2 (two) times daily with a meal.   clopidogrel 75 MG tablet Commonly known as: PLAVIX Take 1 tablet (75 mg total) by mouth daily. Start taking on: July 17, 2023 What changed: See the new instructions.   dapagliflozin propanediol 10 MG Tabs tablet Commonly known as: FARXIGA Take 1 tablet (10 mg total) by mouth daily.   Ensure Max Protein Liqd Take 330 mLs (11 oz total) by mouth 2 (two) times daily.   ferrous sulfate 325 (65 FE) MG EC tablet Take 325 mg by mouth daily with breakfast.   folic acid 1 MG tablet Commonly known as: FOLVITE Take 1 tablet (1 mg total) by mouth daily.   furosemide 40 MG tablet Commonly known as: LASIX Take 1 tablet (40 mg total) by mouth daily. Start taking on: July 17, 2023 What changed:  medication strength how much to take additional instructions   insulin aspart protamine- aspart (70-30) 100 UNIT/ML injection Commonly known as: NOVOLOG MIX 70/30 Inject 30 Units into the skin 2 (two) times daily with a meal.   methocarbamol 500 MG tablet  Commonly known as: ROBAXIN Take 500 mg by mouth every 8 (eight) hours as needed for muscle spasms.   mirtazapine 30 MG tablet Commonly known as: REMERON Take 1 tablet by mouth at bedtime.   nitroGLYCERIN 0.4 MG SL tablet Commonly known as: Nitrostat Place 1 tablet (0.4 mg total) under the tongue every 5 (five) minutes as needed for chest pain. Max: 3 tablets per day   ondansetron 4 MG tablet Commonly known as: ZOFRAN Take 1 tablet (4 mg total) by mouth every 6 (six) hours as needed for nausea.   polyethylene glycol 17 g packet Commonly  known as: MIRALAX / GLYCOLAX Take 17 g by mouth daily as needed for mild constipation. What changed: when to take this   sacubitril-valsartan 24-26 MG Commonly known as: ENTRESTO Take 1 tablet by mouth every 12 (twelve) hours.   senna-docusate 8.6-50 MG tablet Commonly known as: Senokot-S Take 1 tablet by mouth at bedtime as needed for mild constipation. What changed: when to take this   spironolactone 25 MG tablet Commonly known as: ALDACTONE Take 1 tablet (25 mg total) by mouth daily. Start taking on: July 17, 2023 What changed:  medication strength how much to take   thiamine 100 MG tablet Commonly known as: Vitamin B-1 Take 1 tablet (100 mg total) by mouth daily.   traZODone 150 MG tablet Commonly known as: DESYREL Take 75 mg by mouth at bedtime.        Follow-up Information     Tiajuana Amass, MD. Go in 1 week(s).   Specialty: Cardiology Contact information: [address] [place] [address] [phone]         [facility]. Go on 07/21/2023.   Specialty: Cardiology Why: Hospital Follow-Up 07/21/23 @ 11:30 Please bring all medications to follow-up appointment [facility], [address] Floor, [address] [employer] Parking at the Advertising account planner information: [address] [place] [address] [phone]        [name], Eli Phillips, MD. Go on 07/28/2023.   Specialty: Family Medicine Why: Please arrive at 1030 for your 1050 NEW PATIENT Appointment. Contact information: [address] [place] [address] [phone]                Discharge Exam: Ceasar Mons Weights   07/14/23 0702 07/15/23 0500 07/16/23 0500  Weight: 84.7 kg 83.5 kg 86 kg   General exam: sleeping, woke to voice, no acute distress Respiratory system: on room air, normal respiratory effort. Cardiovascular system: RRR, +LLE edema.   Gastrointestinal system: soft, NT, ND Central nervous system:no  gross focal neurologic deficits, normal speech Extremities: R BKA, LLE edema Psychiatry: normal mood, congruent affec  Condition at discharge: good  The results of significant diagnostics from this hospitalization (including imaging, microbiology, ancillary and laboratory) are listed below for reference.   Imaging Studies: CARDIAC CATHETERIZATION Result Date: 07/14/2023   Ost RCA to Prox RCA lesion is 100% stenosed.   Ramus lesion is 70% stenosed.   1st Diag lesion is 99% stenosed.   Prox Cx lesion is 95% stenosed.   Dist LM lesion is 30% stenosed.   Mid LAD lesion is 90% stenosed.   Prox LAD to Mid LAD lesion is 80% stenosed.   Ost LAD to Prox LAD lesion is 50% stenosed.   A drug-eluting stent was successfully placed using a STENT ONYX FRONTIER 2.25X38.   A drug-eluting stent was successfully placed using a STENT ONYX FRONTIER 2.5X26.   A  drug-eluting stent was successfully placed using a STENT ONYX FRONTIER 2.75X15.   Post intervention, there is a 0% residual stenosis.   Post intervention, there is a 0% residual stenosis.   Post intervention, there is a 0% residual stenosis. 1.  Severe three-vessel coronary artery disease. 2.  Left ventricular angiography was not performed.  EF was severely reduced by echo. 3.  Right heart catheterization showed moderately elevated right and left-sided filling pressures, moderate to severe pulmonary hypertension and moderately reduced cardiac output. 4.  Successful angioplasty and 3 overlapped drug-eluting stent placement to the LAD.  Extremely difficult procedure due to tortuosity and calcifications.  There was a proximal edge dissection that required adding a third stent all the way back to the ostium of the LAD.  The patient had significant chest pain and hypotension with the telescope catheter in place.  He was started on nitroglycerin drip at the end. Recommendations: I felt that the patient was not a candidate for CABG and thus I proceeded with LAD PCI.  Will  transfer the patient to stepdown ICU given chest pain postprocedure and will use nitroglycerin drip.  Recommend medical therapy for the rest of his coronary artery disease. Continue IV diuresis for heart failure.   ECHOCARDIOGRAM COMPLETE Result Date: 07/11/2023    ECHOCARDIOGRAM REPORT   Patient Name:   WILLY PINKERTON Date of Exam: 07/10/2023 Medical Rec #:  [id number]        Height:       68.0 in Accession #:    [id number]       Weight:       211.0 lb Date of Birth:  11/22/1945        BSA:          2.091 m Patient Age:    57 years         BP:           141/85 mmHg Patient Gender: M                HR:           73 bpm. Exam Location:  [facility] Procedure: 2D Echo, 3D Echo, Cardiac Doppler, Color Doppler and Strain Analysis            (Both Spectral and Color Flow Doppler were utilized during            procedure). Indications:     NSTEMI, Dyspnea  History:         Patient has prior history of Echocardiogram examinations, most                  recent 11/14/2022. Acute MI, CAD and Previous Myocardial                  Infarction, PAD, Signs/Symptoms:Dyspnea; Risk                  Factors:Hypertension, Diabetes and Dyslipidemia. CKKD, ETOH                  abuse.  Sonographer:     Mikki Harbor Referring Phys:  [id number] AMY N COX Diagnosing Phys: Marcina Millard MD  Sonographer Comments: Global longitudinal strain was attempted. IMPRESSIONS  1. Left ventricular ejection fraction, by estimation, is 25 to 30%. The left ventricle has severely decreased function. The left ventricle demonstrates regional wall motion abnormalities (see scoring diagram/findings for description). The left ventricular internal cavity size was moderately dilated. Left ventricular diastolic parameters were normal. The global longitudinal  strain is normal.  2. Right ventricular systolic function is normal. The right ventricular size is normal. There is normal pulmonary artery systolic pressure.  3. The mitral valve is normal in structure. Mild  mitral valve regurgitation. No evidence of mitral stenosis.  4. Tricuspid valve regurgitation is mild to moderate.  5. The aortic valve is normal in structure. Aortic valve regurgitation is not visualized. Aortic valve sclerosis is present, with no evidence of aortic valve stenosis.  6. The inferior vena cava is normal in size with greater than 50% respiratory variability, suggesting right atrial pressure of 3 mmHg. FINDINGS  Left Ventricle: Left ventricular ejection fraction, by estimation, is 25 to 30%. The left ventricle has severely decreased function. The left ventricle demonstrates regional wall motion abnormalities. Strain was performed and the global longitudinal strain is normal. The left ventricular internal cavity size was moderately dilated. There is no left ventricular hypertrophy. Left ventricular diastolic parameters were normal.  LV Wall Scoring: The apical lateral segment, mid inferoseptal segment, apical septal segment, and apex are hypokinetic. Right Ventricle: The right ventricular size is normal. No increase in right ventricular wall thickness. Right ventricular systolic function is normal. There is normal pulmonary artery systolic pressure. The tricuspid regurgitant velocity is 2.63 m/s, and  with an assumed right atrial pressure of 8 mmHg, the estimated right ventricular systolic pressure is 35.7 mmHg. Left Atrium: Left atrial size was normal in size. Right Atrium: Right atrial size was normal in size. Pericardium: There is no evidence of pericardial effusion. Mitral Valve: The mitral valve is normal in structure. Mild mitral valve regurgitation. No evidence of mitral valve stenosis. MV peak gradient, 3.8 mmHg. The mean mitral valve gradient is 1.0 mmHg. Tricuspid Valve: The tricuspid valve is normal in structure. Tricuspid valve regurgitation is mild to moderate. No evidence of tricuspid stenosis. Aortic Valve: The aortic valve is normal in structure. Aortic valve regurgitation is not  visualized. Aortic valve sclerosis is present, with no evidence of aortic valve stenosis. Aortic valve mean gradient measures 2.0 mmHg. Aortic valve peak gradient measures 3.9 mmHg. Aortic valve area, by VTI measures 2.44 cm. Pulmonic Valve: The pulmonic valve was normal in structure. Pulmonic valve regurgitation is not visualized. No evidence of pulmonic stenosis. Aorta: The aortic root is normal in size and structure. Venous: The inferior vena cava is normal in size with greater than 50% respiratory variability, suggesting right atrial pressure of 3 mmHg. IAS/Shunts: No atrial level shunt detected by color flow Doppler. Additional Comments: 3D was performed not requiring image post processing on an independent workstation and was normal.  LEFT VENTRICLE PLAX 2D LVIDd:         4.90 cm      Diastology LVIDs:         4.30 cm      LV e' medial:    4.68 cm/s LV PW:         1.40 cm      LV E/e' medial:  17.2 LV IVS:        1.10 cm      LV e' lateral:   6.09 cm/s LVOT diam:     2.00 cm      LV E/e' lateral: 13.2 LV SV:         41 LV SV Index:   19 LVOT Area:     3.14 cm  LV Volumes (MOD) LV vol d, MOD A2C: 135.0 ml LV vol d, MOD A4C: 137.0 ml LV vol s, MOD A2C: 97.9  ml LV vol s, MOD A4C: 98.4 ml LV SV MOD A2C:     37.1 ml LV SV MOD A4C:     137.0 ml LV SV MOD BP:      39.5 ml RIGHT VENTRICLE RV Basal diam:  3.45 cm RV Mid diam:    3.40 cm RV S prime:     6.20 cm/s LEFT ATRIUM             Index        RIGHT ATRIUM           Index LA diam:        4.10 cm 1.96 cm/m   RA Area:     18.70 cm LA Vol (A2C):   79.9 ml 38.22 ml/m  RA Volume:   48.70 ml  23.29 ml/m LA Vol (A4C):   76.6 ml 36.64 ml/m LA Biplane Vol: 80.5 ml 38.50 ml/m  AORTIC VALVE                    PULMONIC VALVE AV Area (Vmax):    2.08 cm     PV Vmax:       0.47 m/s AV Area (Vmean):   2.10 cm     PV Peak grad:  0.9 mmHg AV Area (VTI):     2.44 cm AV Vmax:           98.70 cm/s AV Vmean:          62.500 cm/s AV VTI:            0.166 m AV Peak Grad:       3.9 mmHg AV Mean Grad:      2.0 mmHg LVOT Vmax:         65.20 cm/s LVOT Vmean:        41.800 cm/s LVOT VTI:          0.129 m LVOT/AV VTI ratio: 0.78  AORTA Ao Root diam: 3.90 cm Ao Asc diam:  3.40 cm MITRAL VALVE               TRICUSPID VALVE MV Area (PHT): 5.34 cm    TR Peak grad:   27.7 mmHg MV Area VTI:   1.73 cm    TR Vmax:        263.00 cm/s MV Peak grad:  3.8 mmHg MV Mean grad:  1.0 mmHg    SHUNTS MV Vmax:       0.98 m/s    Systemic VTI:  0.13 m MV Vmean:      51.8 cm/s   Systemic Diam: 2.00 cm MV Decel Time: 142 msec MV E velocity: 80.30 cm/s MV A velocity: 66.60 cm/s MV E/A ratio:  1.21 Marcina Millard MD Electronically signed by Marcina Millard MD Signature Date/Time: 07/11/2023/1:03:05 PM    Final    US ARTERIAL ABI (SCREENING LOWER EXTREMITY) Result Date: 07/10/2023 CLINICAL DATA:  Peripheral arterial disease EXAM: NONINVASIVE PHYSIOLOGIC VASCULAR STUDY OF BILATERAL LOWER EXTREMITIES TECHNIQUE: Evaluation of both lower extremities were performed at rest, including calculation of ankle-brachial indices with single level Doppler, pressure and pulse volume recording. COMPARISON:  None available FINDINGS: Right ABI:  Not obtained, previous   below-knee amputation. Left ABI: 0.95 Right Lower Extremity:  Post below-knee amputation Left Lower Extremity:  Multiphasic arterial waveforms at the ankle. IMPRESSION: 1. Borderline left lower extremity arterial occlusive disease at rest. 2. Post right below-knee amputation. Electronically Signed   By: Corlis Leak M.D.   On: 07/10/2023  17:33   DG Chest 2 View Result Date: 07/09/2023 CLINICAL DATA:  Shortness of breath. EXAM: CHEST - 2 VIEW COMPARISON:  01/15/2023. FINDINGS: Low lung volume. Mild diffuse vascular congestion. There is subtle blunting of bilateral costophrenic angles, suggesting bilateral trace pleural effusions. Bilateral lung fields are clear. Bilateral costophrenic angles are clear. Stable cardio-mediastinal silhouette. No acute osseous  abnormalities. The soft tissues are within normal limits. IMPRESSION: Mild diffuse vascular congestion and bilateral trace pleural effusions, concerning for congestive heart failure/pulmonary edema. Number Correlate clinically. Electronically Signed   By: Jules Schick M.D.   On: 07/09/2023 15:10    Microbiology: Results for orders placed or performed during the hospital encounter of 07/09/23  MRSA Next Gen by PCR, Nasal     Status: None   Collection Time: 07/14/23  2:12 PM   Specimen: Nasal Mucosa; Nasal Swab  Result Value Ref Range Status   MRSA by PCR Next Gen NOT DETECTED NOT DETECTED Final    Comment: (NOTE) The GeneXpert MRSA Assay (FDA approved for NASAL specimens only), is one component of a comprehensive MRSA colonization surveillance program. It is not intended to diagnose MRSA infection nor to guide or monitor treatment for MRSA infections. Test performance is not FDA approved in patients less than 24 years old. Performed at [facility], [address] [place], [place] [address]     Labs: CBC: Recent Labs  Lab 07/10/23 1717 07/11/23 0407 07/13/23 0518 07/14/23 1118 07/14/23 1125 07/15/23 0302 07/16/23 0420  WBC 9.2 6.5 6.6  --   --  5.6 5.4  HGB 11.1* 10.6* 10.3* 11.2* 11.2* 9.8* 10.0*  HCT 34.8* 33.3* 31.8* 33.0* 33.0* 30.1* 30.7*  MCV 79.6* 78.0* 76.3*  --   --  75.4* 75.6*  PLT 277 246 223  --   --  222 222   Basic Metabolic Panel: Recent Labs  Lab 07/12/23 0633 07/13/23 0518 07/14/23 0629 07/14/23 1118 07/14/23 1125 07/15/23 0302 07/16/23 0420  NA 132* 132* 135 137 137 132* 133*  K 3.7 3.9 3.6 3.8 3.7 3.6 3.9  CL 98 98 98  --   --  96* 97*  CO2 27 30 30   --   --  28 27  GLUCOSE 123* 97 77  --   --  193* 122*  BUN 21 25* 29*  --   --  26* 32*  CREATININE 0.87 0.90 0.81  --   --  0.94 1.22  CALCIUM 9.2 8.7* 9.0  --   --  8.9 9.0  MG 2.0 1.8 1.8  --   --  1.8 2.0   Liver Function Tests: No results for input(s): "AST", "ALT",  "ALKPHOS", "BILITOT", "PROT", "ALBUMIN" in the last 168 hours. CBG: Recent Labs  Lab 07/15/23 1132 07/15/23 1530 07/15/23 2119 07/16/23 0752 07/16/23 1142  GLUCAP 233* 282* 119* 117* 237*    Discharge time spent:  35 minutes.  Signed: Loyce Dys, MD [facility] 07/16/2023

## 2023-07-16 NOTE — Progress Notes (Signed)
 Heart Failure Stewardship Pharmacy Note  PCP: Gracelyn Nurse, MD PCP-Cardiologist: None  HPI: Manuel [name] is a 78 y.o. male with CAD, PAD s/p right BKA, gout, hyperlipidemia, hypertension, depression, anxiety, insomnia  who presented with insomnia, shortness of breath, and decreased urine output. On admission, BNP was 2395.3, HS-troponin was 1045 > 944 > 895, and A1c was 8. Chest x-ray noted mild diffuse vascular congestion, bilateral trace pleural effusions, concerning for CHF.   Pertinent cardiac history: CAD s/p PCI to [facility] with overlapping stents in 2011. extensive PAD s/p PTA L common Iliac, R peroneal, R distal SFA/prox popliteal, and stent to R distal SFA/prox popliteal arteries (05/14/21). Echo in 10/2022 with LVEF of 25-30%. Echo 06/2023 with LVEF 25-30%, moderately dilated LV, and mild valvular disease. LHC 06/2023 where 3 DES were placed to the LAD.  Pertinent Lab Values: Creatinine  Date Value Ref Range Status  02/16/2012 0.72 0.60 - 1.30 mg/dL Final   Creatinine, Ser  Date Value Ref Range Status  07/16/2023 1.22 0.61 - 1.24 mg/dL Final   BUN  Date Value Ref Range Status  07/16/2023 32 (H) 8 - 23 mg/dL Final  09/81/1914 10 7 - 18 mg/dL Final   Potassium  Date Value Ref Range Status  07/16/2023 3.9 3.5 - 5.1 mmol/L Final  02/16/2012 3.8 3.5 - 5.1 mmol/L Final   Sodium  Date Value Ref Range Status  07/16/2023 133 (L) 135 - 145 mmol/L Final  02/16/2012 137 136 - 145 mmol/L Final   B Natriuretic Peptide  Date Value Ref Range Status  07/09/2023 2,395.3 (H) 0.0 - 100.0 pg/mL Final    Comment:    Performed at [facility], [address] [place], [place] [address]   Magnesium  Date Value Ref Range Status  07/16/2023 2.0 1.7 - 2.4 mg/dL Final    Comment:    Performed at [facility], [address] [place], [place] [address]   Hgb A1c MFr Bld  Date Value Ref Range Status  07/10/2023 8.0 (H) 4.8 - 5.6 % Final    Comment:    (NOTE) Pre  diabetes:          5.7%-6.4%  Diabetes:              >6.4%  Glycemic control for   <7.0% adults with diabetes    TSH  Date Value Ref Range Status  04/19/2023 1.416 0.350 - 4.500 uIU/mL Final    Comment:    Performed by a 3rd Generation assay with a functional sensitivity of <=0.01 uIU/mL. Performed at [facility], [address] [place], [place] [address]     Vital Signs: Temp:  [97.5 F (36.4 C)-98.3 F (36.8 C)] 97.8 F (36.6 C) (03/26 0800) Pulse Rate:  [58-70] 69 (03/26 0800) Cardiac Rhythm: Normal sinus rhythm (03/26 0800) Resp:  [0-27] 20 (03/26 0800) BP: (88-137)/(55-100) 122/80 (03/26 0800) SpO2:  [92 %-100 %] 94 % (03/26 0800) FiO2 (%):  [21 %] 21 % (03/25 2046) Weight:  [86 kg (189 lb 9.5 oz)] 86 kg (189 lb 9.5 oz) (03/26 0500)  Intake/Output Summary (Last 24 hours) at 07/16/2023 1027 Last data filed at 07/16/2023 0906 Gross per 24 hour  Intake 500 ml  Output 800 ml  Net -300 ml    Current Heart Failure Medications:  Loop diuretic: furosemide 40 mg daily Beta-Blocker: carvedilol 25 mg BID ACEI/ARB/ARNI: Entresto 24-26 mg BID MRA: spironolactone 25 mg daily SGLT2i: Farxiga 10 mg daily Other: none  Prior to  admission Heart Failure Medications:  Loop diuretic: furosemide 20 mg x 10 days Beta-Blocker: carvedilol 25 mg BID ACEI/ARB/ARNI: Entresto 24-26 mg BID MRA: spironolactone 50 mg daily SGLT2i: none Other: none  Assessment: 1. Acute on chronic systolic heart failure (LVEF 25-30%)  , due to ICM. NYHA class II-III symptoms.  -Symptoms: Reports feeling much improved from admission. SOB is improved and energy is returning. -Volume: Appears euvolemic. -Hemodynamics: BP low overnight, now high. HR 60s. -BB: Continue target dose carvedilol 25 mg BID. -ACEI/ARB/ARNI: Continue Entresto 24-26 gm BID. Can consider further titration outpatient. -MRA: Continue spironolactone 25 mg daily -SGLT2i: Farxiga 10 mg daily  Plan: 1) Medication changes  recommended at this time: -None  2) Patient assistance: -Pending  3) Education: - Patient has been educated on current HF medications and potential additions to HF medication regimen - Patient verbalizes understanding that over the next few months, these medication doses may change and more medications may be added to optimize HF regimen - Patient has been educated on basic disease state pathophysiology and goals of therapy  Medication Assistance / Insurance Benefits Check: Does the patient have prescription insurance?    Type of insurance plan:  Does the patient qualify for medication assistance through manufacturers or grants? Pending   Outpatient Pharmacy: Prior to admission outpatient pharmacy: [facility]      Please do not hesitate to reach out with questions or concerns,  Enos Fling, PharmD, CPP, BCPS Heart Failure Pharmacist  Phone - [phone] 07/16/2023 3:10 PM

## 2023-07-18 ENCOUNTER — Telehealth: Payer: Self-pay | Admitting: Family

## 2023-07-18 NOTE — Telephone Encounter (Incomplete)
 Called to confirm/remind patient of their appointment at [facility] on 07/21/23***.   Appointment:   [] Confirmed  [x] Left mess   [] No answer/No voice mail  [] Phone not in service  Patient reminded to bring all medications and/or complete list.  Confirmed patient has transportation. Gave directions, instructed to utilize valet parking.

## 2023-07-20 NOTE — Progress Notes (Unsigned)
 Advanced [facility] Note   Referring Physician: recent admission PCP: Gracelyn Nurse, MD (last seen 03/25) Cardiologist: Tiajuana Amass, MD (to be seen 04/25)  Chief Complaint: shortness of breath  HPI:  Manuel [name] is a 78 y/o male with a history of CAD, PAD s/p right BKA, gout, DM, hyperlipidemia, HTN, depression, anxiety, insomnia and chronic heart failure. CAD s/p PCI to [facility] with overlapping stents in 2011. extensive PAD s/p PTA L common Iliac, R peroneal, R distal SFA/prox popliteal, and stent to R distal SFA/prox popliteal arteries (05/14/21). Echo in 10/2022 with LVEF of 25-30%.   Admitted 07/09/23 due to shortness of breath, decreased urine output, and inability to sleep for the last year. Found to have NSTEMI with peak troponin of 1045. Echo 06/2023 with LVEF 25-30%, moderately dilated LV, and mild valvular disease. LHC 06/2023 where 3 DES were placed to the LAD. Chest x-ray noted mild diffuse vascular congestion, bilateral trace pleural effusions, concerning for CHF.   He presents today, with his girlfriend, for his initial HF visit with a chief complaint of minimal shortness of breath with exertion. Has associated fatigue, chronic difficulty sleeping and slight swelling in left lower leg along with this. Has been in a wheelchair for the last 3 months due to right BKA. Denies chest pain, palpitations, abdominal distention or dizziness. He says that he's taking trazodone for sleep but says that it doesn't help. Tried melatonin in the past which also didn't work.   Drinks a 12 ounce can of beer daily. No liquor in the last 4-5 months, denies tobacco/ drug use.   Would like a referral to [facility] cardiology as that is who did his cath during the hospital and they plan to continue with [facility]. To have upcoming sleep study done.   Review of Systems: [y] = yes, [ ]  = no   General: Weight gain [ ] ; Weight loss [ ] ; Anorexia [ ] ; Fatigue Cove.Etienne ]; Fever [ ] ; Chills [ ] ; Weakness [ ]    Cardiac: Chest pain/pressure [ ] ; Resting SOB [ ] ; Exertional SOB Cove.Etienne ]; Orthopnea [ ] ; Pedal Edema Cove.Etienne ]; Palpitations [ ] ; Syncope [ ] ; Presyncope [ ] ; Paroxysmal nocturnal dyspnea[ ]   Pulmonary: Cough [ ] ; Wheezing[ ] ; Hemoptysis[ ] ; Sputum [ ] ; Snoring [ ]   GI: Vomiting[ ] ; Dysphagia[ ] ; Melena[ ] ; Hematochezia [ ] ; Heartburn[ ] ; Abdominal pain [ ] ; Constipation [ ] ; Diarrhea [ ] ; BRBPR [ ]   GU: Hematuria[ ] ; Dysuria [ ] ; Nocturia[ ]   Vascular: Pain in legs with walking [ ] ; Pain in feet with lying flat [ ] ; Non-healing sores [ ] ; Stroke [ ] ; TIA [ ] ; Slurred speech [ ] ;  Neuro: Headaches[ ] ; Vertigo[ ] ; Seizures[ ] ; Paresthesias[ ] ;Blurred vision [ ] ; Diplopia [ ] ; Vision changes [ ]   Ortho/Skin: Arthritis [ ] ; Joint pain [ ] ; Muscle pain [ ] ; Joint swelling [ ] ; Back Pain [ ] ; Rash [ ]   Psych: Depression[ ] ; Anxiety[ ]   Heme: Bleeding problems [ ] ; Clotting disorders [ ] ; Anemia [ ]   Endocrine: Diabetes Cove.Etienne ]; Thyroid dysfunction[ ]    Past Medical History:  Diagnosis Date   Acute ST elevation myocardial infarction (STEMI) of inferior wall (HCC) 04/19/2010   a.) transfered [facility] to [facility] --> LHC/PCI (very difficult procedure) --> 3.0 x 23 mm and 3.0 x 12 mm Xience stents to RCA   Allergies    Arthritis    Benign essential hypertension    Bilateral carotid artery disease (HCC) 05/08/2021  a.) carotid doppler 05/08/2021: 1-39% BICA   CAD (coronary artery disease) 04/19/2010   a.) inferior STEMI 04/19/2010 --> LHC/PCI: 50-70% pD1, 80% pRI, 90/90/90% RCA (overlapping 3.0 x 23 and 3.0 x 12 mm Xience DES); b.) MV 11/10/2018: fixed minimally reversible inferior perfusion defect   Cellulitis of foot    Chronic HFrEF (heart failure with reduced ejection fraction) (HCC)    DDD (degenerative disc disease), cervical    Diabetes mellitus type 2, insulin dependent (HCC)    Diverticulosis    Full dentures    Gout    Hard of hearing    History of bilateral cataract extraction 2022   History  of ETOH abuse    Hyperlipidemia    Ischemic cardiomyopathy 04/19/2010   a.) TTE 04/19/2010: 40%; b.) TTE 04/20/2014: EF >55%; c.) TTE 11/10/2018: EF 45%; d.) TTE 11/14/2022: EF 25-30%; e. 06/2023 Echo: EF 25-30%, nl RV size/fxn. Mild Manuel. Mild-mod TR. Ao sclerosis w/o stenosis.   Long term current use of aspirin    Long term current use of clopidogrel    Lumbar degenerative disc disease    Lumbar radiculopathy    Lumbar vertebral fracture (chronic superior endplate of L1)    NSTEMI (non-ST elevated myocardial infarction) (HCC) 01/15/2023   OSA (obstructive sleep apnea)    a.) unable to tolerate nocturnal PAP therapy   Peripheral artery disease (HCC)    a.) stenting 05/14/21: 12 mm x 12 cm LifeStent RIGHT dis SFA/prox pop; b.) s/p cath directed thrombolysis RIGHT SFA/pop 06/14/21; c.) s/p mech thrombectomy + stenting 06/15/21: 8 mm x 25cm & 8 mm x 7.5cm Viabahn; d.) s/p BILAT CFA, profunda femoris, SFA endarterectomies + fogarty embolectomy + stenting 11/15/22: 12mm x 58mm Lifestream BILAT CIAs, 14 mm x 6 cm Lifestream & 13 mm x 5 cm Viabahn LEFT EIA   Peripheral neuropathy    Umbilical hernia     Current Outpatient Medications  Medication Sig Dispense Refill   aspirin EC 81 MG tablet Take 1 tablet (81 mg total) by mouth daily. 150 tablet 2   atorvastatin (LIPITOR) 80 MG tablet Take 1 tablet (80 mg total) by mouth at bedtime. 30 tablet 6   carvedilol (COREG) 25 MG tablet Take 25 mg by mouth 2 (two) times daily with a meal.     clopidogrel (PLAVIX) 75 MG tablet Take 1 tablet (75 mg total) by mouth daily. 30 tablet 11   dapagliflozin propanediol (FARXIGA) 10 MG TABS tablet Take 1 tablet (10 mg total) by mouth daily. 30 tablet 2   Ensure Max Protein (ENSURE MAX PROTEIN) LIQD Take 330 mLs (11 oz total) by mouth 2 (two) times daily. (Patient taking differently: Take 11 oz by mouth daily.) 10000 mL 3   ferrous sulfate 325 (65 FE) MG EC tablet Take 325 mg by mouth daily with breakfast.     folic acid  (FOLVITE) 1 MG tablet Take 1 tablet (1 mg total) by mouth daily. 90 tablet 0   furosemide (LASIX) 40 MG tablet Take 1 tablet (40 mg total) by mouth daily. 30 tablet 2   insulin aspart protamine- aspart (NOVOLOG MIX 70/30) (70-30) 100 UNIT/ML injection Inject 30 Units into the skin 2 (two) times daily with a meal.     methocarbamol (ROBAXIN) 500 MG tablet Take 500 mg by mouth every 8 (eight) hours as needed for muscle spasms.     mirtazapine (REMERON) 30 MG tablet Take 1 tablet by mouth at bedtime.     nitroGLYCERIN (NITROSTAT) 0.4 MG SL  tablet Place 1 tablet (0.4 mg total) under the tongue every 5 (five) minutes as needed for chest pain. Max: 3 tablets per day 100 tablet 3   ondansetron (ZOFRAN) 4 MG tablet Take 1 tablet (4 mg total) by mouth every 6 (six) hours as needed for nausea.     polyethylene glycol (MIRALAX / GLYCOLAX) 17 g packet Take 17 g by mouth daily as needed for mild constipation. (Patient taking differently: Take 17 g by mouth daily.) 14 each 0   sacubitril-valsartan (ENTRESTO) 24-26 MG Take 1 tablet by mouth every 12 (twelve) hours.     senna-docusate (SENOKOT-S) 8.6-50 MG tablet Take 1 tablet by mouth at bedtime as needed for mild constipation. (Patient taking differently: Take 1 tablet by mouth at bedtime.) 30 tablet 0   spironolactone (ALDACTONE) 25 MG tablet Take 1 tablet (25 mg total) by mouth daily. 30 tablet 2   thiamine (VITAMIN B-1) 100 MG tablet Take 1 tablet (100 mg total) by mouth daily. 90 tablet 0   traZODone (DESYREL) 150 MG tablet Take 75 mg by mouth at bedtime.     No current facility-administered medications for this visit.    No Known Allergies    Social History   Socioeconomic History   Marital status: Married    Spouse name: [name]   Number of children: 3   Years of education: Not on file   Highest education level: Not on file  Occupational History   Not on file  Tobacco Use   Smoking status: Never   Smokeless tobacco: Never  Vaping Use   Vaping  status: Never Used  Substance and Sexual Activity   Alcohol use: Yes    Alcohol/week: 21.0 standard drinks of alcohol    Types: 7 Glasses of wine, 14 Cans of beer per week    Comment: occassional   Drug use: No   Sexual activity: Not on file  Other Topics Concern   Not on file  Social History Narrative   Not on file   Social Drivers of Health   Financial Resource Strain: Low Risk  (07/11/2023)   Overall Financial Resource Strain (CARDIA)    Difficulty of Paying Living Expenses: Not hard at all  Food Insecurity: No Food Insecurity (07/10/2023)   Hunger Vital Sign    Worried About Running Out of Food in the Last Year: Never true    Ran Out of Food in the Last Year: Never true  Transportation Needs: No Transportation Needs (07/11/2023)   PRAPARE - Administrator, Civil Service (Medical): No    Lack of Transportation (Non-Medical): No  Physical Activity: Not on file  Stress: Not on file  Social Connections: Unknown (07/10/2023)   Social Connection and Isolation Panel [NHANES]    Frequency of Communication with Friends and Family: More than three times a week    Frequency of Social Gatherings with Friends and Family: More than three times a week    Attends Religious Services: Patient declined    Database administrator or Organizations: No    Attends Banker Meetings: Patient declined    Marital Status: Married  Catering manager Violence: Not At Risk (07/10/2023)   Humiliation, Afraid, Rape, and Kick questionnaire    Fear of Current or Ex-Partner: No    Emotionally Abused: No    Physically Abused: No    Sexually Abused: No      Family History  Problem Relation Age of Onset   Scoliosis Mother  Heart disease Father     Vitals:   07/21/23 1137  BP: 119/71  Pulse: 60  SpO2: 99%  Weight: 195 lb 6.4 oz (88.6 kg)   Wt Readings from Last 3 Encounters:  07/21/23 195 lb 6.4 oz (88.6 kg)  07/16/23 189 lb 9.5 oz (86 kg)  06/20/23 184 lb (83.5 kg)    Lab Results  Component Value Date   CREATININE 1.22 07/16/2023   CREATININE 0.94 07/15/2023   CREATININE 0.81 07/14/2023    PHYSICAL EXAM:  General: Well appearing. No resp difficulty HEENT: normal Neck: supple, no JVD Cor: Regular rhythm, rate. No rubs, gallops or murmurs Lungs: clear Abdomen: soft, nontender, nondistended. Extremities: no cyanosis, clubbing, rash, 2+ pitting edema left lower leg up to knee; right BKA Neuro: alert & oriented X 3. Moves all 4 extremities w/o difficulty. Affect pleasant   ECG: not done   ASSESSMENT & PLAN:  1: ICM with reduced ejection fraction- - NYHA class II - fluid up with worsening symptoms and pedal edema - Echo 07/10/23 with LVEF 25-30%, moderately dilated LV, and mild valvular disease.  - continue carvedilol 25mg  BID - continue farxiga 10mg  daily - continue furosemide 40mg  daily - continue entresto 24/26mg  BID - continue spironolactone 25mg  daily - will add metolazone 2.5mg  daily X 3 doses with potassium daily X 3 doses - get compression sock and put on left leg - doesn't add salt to his food but does eat out 3/4 of the time; reviewed how to make low sodium choices - BMET, pro-BNP today & will message PCP to recheck labs next week - BNP 07/09/23 was 2395.3  2: HTN- - BP 119/71 - saw PCP Lorenza Chick) 03/25; has upcoming PCP appt @ [facility] 04/25 - BMET 07/16/23 reviewed and showed sodium 133, potassium 3.9, creatinine 1.22 & GFR >60  3: CAD- - was previously followed at [facility] but is now being followed by [facility] (per his request) - does not have post-hospital f/u so referral placed today - [facility] 07/14/23: Severe three-vessel coronary artery disease. Right heart catheterization showed moderately elevated right and left-sided filling pressures, moderate to severe pulmonary hypertension and moderately reduced cardiac output. Successful angioplasty and 3 overlapped drug-eluting stent placement to the LAD. - continue  atorvastatin 80mg  daily  4: DM- - A1c 07/10/23 was 8.0% - continue insulin  5: PAD- - saw vascular (Dew) 02/25 - s/p PTA L common Iliac, R peroneal, R distal SFA/prox popliteal, and stent to R distal SFA/prox popliteal arteries (05/14/21) - continue plavix 75mg  daiy   Since he has PCP appt next week, will have him f/u here in a few weeks. Sooner if needed.   Delma Freeze, FNP 07/20/23

## 2023-07-21 ENCOUNTER — Ambulatory Visit: Attending: Family | Admitting: Family

## 2023-07-21 ENCOUNTER — Encounter: Payer: Self-pay | Admitting: Family

## 2023-07-21 VITALS — BP 119/71 | HR 60 | Wt 195.4 lb

## 2023-07-21 DIAGNOSIS — R5383 Other fatigue: Secondary | ICD-10-CM | POA: Diagnosis not present

## 2023-07-21 DIAGNOSIS — Z79899 Other long term (current) drug therapy: Secondary | ICD-10-CM | POA: Diagnosis not present

## 2023-07-21 DIAGNOSIS — Z7984 Long term (current) use of oral hypoglycemic drugs: Secondary | ICD-10-CM | POA: Diagnosis not present

## 2023-07-21 DIAGNOSIS — I10 Essential (primary) hypertension: Secondary | ICD-10-CM

## 2023-07-21 DIAGNOSIS — E1151 Type 2 diabetes mellitus with diabetic peripheral angiopathy without gangrene: Secondary | ICD-10-CM | POA: Insufficient documentation

## 2023-07-21 DIAGNOSIS — R0602 Shortness of breath: Secondary | ICD-10-CM | POA: Diagnosis present

## 2023-07-21 DIAGNOSIS — I083 Combined rheumatic disorders of mitral, aortic and tricuspid valves: Secondary | ICD-10-CM | POA: Diagnosis not present

## 2023-07-21 DIAGNOSIS — I5022 Chronic systolic (congestive) heart failure: Secondary | ICD-10-CM | POA: Diagnosis not present

## 2023-07-21 DIAGNOSIS — Z955 Presence of coronary angioplasty implant and graft: Secondary | ICD-10-CM | POA: Insufficient documentation

## 2023-07-21 DIAGNOSIS — G4700 Insomnia, unspecified: Secondary | ICD-10-CM | POA: Insufficient documentation

## 2023-07-21 DIAGNOSIS — I2510 Atherosclerotic heart disease of native coronary artery without angina pectoris: Secondary | ICD-10-CM | POA: Insufficient documentation

## 2023-07-21 DIAGNOSIS — I739 Peripheral vascular disease, unspecified: Secondary | ICD-10-CM

## 2023-07-21 DIAGNOSIS — Z7902 Long term (current) use of antithrombotics/antiplatelets: Secondary | ICD-10-CM | POA: Insufficient documentation

## 2023-07-21 DIAGNOSIS — I252 Old myocardial infarction: Secondary | ICD-10-CM | POA: Insufficient documentation

## 2023-07-21 DIAGNOSIS — E785 Hyperlipidemia, unspecified: Secondary | ICD-10-CM | POA: Diagnosis not present

## 2023-07-21 DIAGNOSIS — Z89511 Acquired absence of right leg below knee: Secondary | ICD-10-CM | POA: Diagnosis not present

## 2023-07-21 DIAGNOSIS — I272 Pulmonary hypertension, unspecified: Secondary | ICD-10-CM | POA: Insufficient documentation

## 2023-07-21 DIAGNOSIS — Z794 Long term (current) use of insulin: Secondary | ICD-10-CM | POA: Diagnosis not present

## 2023-07-21 DIAGNOSIS — I110 Hypertensive heart disease with heart failure: Secondary | ICD-10-CM | POA: Diagnosis not present

## 2023-07-21 DIAGNOSIS — R609 Edema, unspecified: Secondary | ICD-10-CM | POA: Diagnosis not present

## 2023-07-21 MED ORDER — POTASSIUM CHLORIDE CRYS ER 20 MEQ PO TBCR: 40.0000 meq | EXTENDED_RELEASE_TABLET | Freq: Every day | ORAL | 0 refills | Status: DC

## 2023-07-21 MED ORDER — METOLAZONE 2.5 MG PO TABS
2.5000 mg | ORAL_TABLET | Freq: Every day | ORAL | 3 refills | Status: DC
Start: 2023-07-21 — End: 2023-12-15

## 2023-07-21 NOTE — Patient Instructions (Signed)
 Medication Changes:  START Metolazone 2.5 MG tablet daily for three days. Take half an hour before lasix. START Potassium 40 MEQ daily for 3 days with metolazone.  Lab Work:  Labs done today, your results will be available in MyChart, we will contact you for abnormal readings.   Referrals:  You have been referred to [facility] for General cardiologist/hospital follow up. Their office will call you to schedule an appointment.  Special Instructions // Education:  Do the following things EVERYDAY: Weigh yourself in the morning before breakfast. Write it down and keep it in a log. Take your medicines as prescribed Eat low salt foods--Limit salt (sodium) to 2000 mg per day.  Stay as active as you can everyday Limit all fluids for the day to less than 2 liters   Follow-Up in: 3 weeks with Clarisa Kindred, FNP     If you have any questions or concerns before your next appointment please send [place] a message through [place] or call our office at [phone], If it is after office hours your call will be answered by our answering service and directed appropriately.     At the [facility], you and your health needs are our priority. We have a designated team specialized in the treatment of Heart Failure. This Care Team includes your primary Heart Failure Specialized Cardiologist (physician), Advanced Practice Providers (APPs- Physician Assistants and Nurse Practitioners), and Pharmacist who all work together to provide you with the care you need, when you need it.   You may see any of the following providers on your designated Care Team at your next follow up:  Dr. Arvilla Meres Dr. Marca Ancona Dr. Dorthula Nettles Dr. Theresia Bough Tonye Becket, NP Robbie Lis, [place] [address] [place], [place] Brynda Peon, NP [place] Lee, NP Clarisa Kindred, NP Enos Fling, PharmD

## 2023-07-22 ENCOUNTER — Encounter: Payer: Self-pay | Admitting: Family

## 2023-07-22 LAB — BASIC METABOLIC PANEL WITH GFR
BUN/Creatinine Ratio: 28 — ABNORMAL HIGH (ref 10–24)
BUN: 30 mg/dL — ABNORMAL HIGH (ref 8–27)
CO2: 25 mmol/L (ref 20–29)
Calcium: 9.4 mg/dL (ref 8.6–10.2)
Chloride: 98 mmol/L (ref 96–106)
Creatinine, Ser: 1.08 mg/dL (ref 0.76–1.27)
Glucose: 140 mg/dL — ABNORMAL HIGH (ref 70–99)
Potassium: 4.4 mmol/L (ref 3.5–5.2)
Sodium: 137 mmol/L (ref 134–144)
eGFR: 70 mL/min/{1.73_m2} (ref >59)

## 2023-07-22 LAB — PRO B NATRIURETIC PEPTIDE: NT-Pro BNP: 9804 pg/mL — ABNORMAL HIGH (ref 0–486)

## 2023-07-24 ENCOUNTER — Ambulatory Visit: Admitting: Psychiatry

## 2023-07-28 ENCOUNTER — Ambulatory Visit (INDEPENDENT_AMBULATORY_CARE_PROVIDER_SITE_OTHER): Admitting: Family Medicine

## 2023-07-28 ENCOUNTER — Encounter: Payer: Self-pay | Admitting: Family Medicine

## 2023-07-28 VITALS — BP 146/76 | HR 70 | Temp 97.4°F | Resp 18 | Ht 67.99 in | Wt 184.0 lb

## 2023-07-28 DIAGNOSIS — E119 Type 2 diabetes mellitus without complications: Secondary | ICD-10-CM

## 2023-07-28 DIAGNOSIS — Z794 Long term (current) use of insulin: Secondary | ICD-10-CM | POA: Diagnosis not present

## 2023-07-28 DIAGNOSIS — F321 Major depressive disorder, single episode, moderate: Secondary | ICD-10-CM | POA: Diagnosis not present

## 2023-07-28 DIAGNOSIS — F5101 Primary insomnia: Secondary | ICD-10-CM

## 2023-07-28 DIAGNOSIS — R630 Anorexia: Secondary | ICD-10-CM | POA: Insufficient documentation

## 2023-07-28 DIAGNOSIS — T879 Unspecified complications of amputation stump: Secondary | ICD-10-CM | POA: Insufficient documentation

## 2023-07-28 LAB — POCT GLYCOSYLATED HEMOGLOBIN (HGB A1C): Hemoglobin A1C: 8 % — AB (ref 4.0–5.6)

## 2023-07-28 MED ORDER — MIRTAZAPINE 45 MG PO TABS: 45.0000 mg | ORAL_TABLET | Freq: Every day | ORAL | 3 refills | Status: DC

## 2023-07-28 MED ORDER — LANTUS SOLOSTAR 100 UNIT/ML ~~LOC~~ SOPN
10.0000 [IU] | PEN_INJECTOR | Freq: Every day | SUBCUTANEOUS | 99 refills | Status: DC
Start: 2023-07-28 — End: 2023-12-15

## 2023-07-28 NOTE — Progress Notes (Signed)
 New Patient Office Visit  Subjective    Patient ID: Manuel [name], male    DOB: 12-18-1945  Age: 78 y.o. MRN: [id number]  CC:  Chief Complaint  Patient presents with   Establish Care   Insomnia    HPI Manuel [name] presents to establish care Delightful 78 yo gentleman with CAD (s/p PCI to RCA with overlapping stents, NSTEMI 3/25 peak troponin 1045, three DES to LAD), ischemic cardiomyopathy (7/24 LVEF 25-35%, 3/25 ECHO LVEF 25-35% Moderate dilatation LV. )PAD (2011 s/p PTAL common iliac, Right peroneal, Left distal SFA, proximal popliteal, stent to right distal SFA), s/p Right BKA, gout, DMT2, mixed hyperlipidemia, HTN, depression/anxiety, insomnia, lumbar DDD.  Comes in with his wife Manuel [name].  Getting up and getting motivated to leave the house is a big deal.  He just prefers not to go anywhere  .He has had some suicidal ideation but not now.  Denies HI.  Thinks his depression came because of the shock of amputation of his right leg and being wheelchair-bound.  His wife endorses that he is depressed.  He does admit he is looking into the future and feels hopeful that he will be ambulatory again.  He has a wound on his stump that has delayed getting his prosthesis.  He denies drainage from the stump, fever or pain.   He is working with Child psychotherapist and is wearing a compression sleeve. His appetite has been poor so he is on Remeron 30 mg daily.  Also reports that he sleeps very poorly.  He can go all day and have no pain in his stump or phantom pains.  When he goes to bed however the pain starts.  It is usually sharp and continuous.  Denies lancing pain.  Has been taking hydromorphone 2 mg for the nocturnal pain.  He thinks he has tried Lyrica and gabapentin without benefit. He was taking NovoLog 70/30 30 units twice daily but had a really low blood sugar in the hospital of 45.  He was diaphoretic and confused.  He also tried a sliding scale but had low blood sugars with that as well.   He has stopped taking all insulin now.  He is not on any oral agent other than [place]. He drinks 1-2 beers a day.  He is off wine and liquor. Outpatient Encounter Medications as of 07/28/2023  Medication Sig   aspirin EC 81 MG tablet Take 1 tablet (81 mg total) by mouth daily.   atorvastatin (LIPITOR) 80 MG tablet Take 1 tablet (80 mg total) by mouth at bedtime.   carvedilol (COREG) 25 MG tablet Take 25 mg by mouth 2 (two) times daily with a meal.   clopidogrel (PLAVIX) 75 MG tablet Take 1 tablet (75 mg total) by mouth daily.   dapagliflozin propanediol (FARXIGA) 10 MG TABS tablet Take 1 tablet (10 mg total) by mouth daily.   Ensure Max Protein (ENSURE MAX PROTEIN) LIQD Take 330 mLs (11 oz total) by mouth 2 (two) times daily. (Patient taking differently: Take 11 oz by mouth daily.)   ferrous sulfate 325 (65 FE) MG EC tablet Take 325 mg by mouth daily with breakfast.   folic acid (FOLVITE) 1 MG tablet Take 1 tablet (1 mg total) by mouth daily.   furosemide (LASIX) 40 MG tablet Take 1 tablet (40 mg total) by mouth daily.   insulin aspart protamine- aspart (NOVOLOG MIX 70/30) (70-30) 100 UNIT/ML injection Inject 30 Units into the skin 2 (two) times daily with a  meal.   insulin glargine (LANTUS SOLOSTAR) 100 UNIT/ML Solostar Pen Inject 10 Units into the skin daily.   methocarbamol (ROBAXIN) 500 MG tablet Take 500 mg by mouth every 8 (eight) hours as needed for muscle spasms.   mirtazapine (REMERON) 30 MG tablet Take 1 tablet by mouth at bedtime.   mirtazapine (REMERON) 45 MG tablet Take 1 tablet (45 mg total) by mouth at bedtime.   nitroGLYCERIN (NITROSTAT) 0.4 MG SL tablet Place 1 tablet (0.4 mg total) under the tongue every 5 (five) minutes as needed for chest pain. Max: 3 tablets per day   ondansetron (ZOFRAN) 4 MG tablet Take 1 tablet (4 mg total) by mouth every 6 (six) hours as needed for nausea.   polyethylene glycol (MIRALAX / GLYCOLAX) 17 g packet Take 17 g by mouth daily as needed for mild  constipation.   potassium chloride SA (KLOR-CON M) 20 MEQ tablet Take 2 tablets (40 mEq total) by mouth daily.   sacubitril-valsartan (ENTRESTO) 24-26 MG Take 1 tablet by mouth every 12 (twelve) hours.   senna-docusate (SENOKOT-S) 8.6-50 MG tablet Take 1 tablet by mouth at bedtime as needed for mild constipation.   spironolactone (ALDACTONE) 25 MG tablet Take 1 tablet (25 mg total) by mouth daily.   thiamine (VITAMIN B-1) 100 MG tablet Take 1 tablet (100 mg total) by mouth daily.   traZODone (DESYREL) 150 MG tablet Take 75 mg by mouth at bedtime.   metolazone (ZAROXOLYN) 2.5 MG tablet Take 1 tablet (2.5 mg total) by mouth daily for 3 days. Take 30 minutes before your lasix.   No facility-administered encounter medications on file as of 07/28/2023.    Past Medical History:  Diagnosis Date   Acute ST elevation myocardial infarction (STEMI) of inferior wall (HCC) 04/19/2010   a.) transfered from [facility] to [facility] --> LHC/PCI (very difficult procedure) --> 3.0 x 23 mm and 3.0 x 12 mm Xience stents to RCA   Allergies    Arthritis    Benign essential hypertension    Bilateral carotid artery disease (HCC) 05/08/2021   a.) carotid doppler 05/08/2021: 1-39% BICA   CAD (coronary artery disease) 04/19/2010   a.) inferior STEMI 04/19/2010 --> LHC/PCI: 50-70% pD1, 80% pRI, 90/90/90% RCA (overlapping 3.0 x 23 and 3.0 x 12 mm Xience DES); b.) MV 11/10/2018: fixed minimally reversible inferior perfusion defect   Cellulitis of foot    Chronic HFrEF (heart failure with reduced ejection fraction) (HCC)    DDD (degenerative disc disease), cervical    Diabetes mellitus type 2, insulin dependent (HCC)    Diverticulosis    Full dentures    Gout    Hard of hearing    History of bilateral cataract extraction 2022   History of ETOH abuse    Hyperlipidemia    Ischemic cardiomyopathy 04/19/2010   a.) TTE 04/19/2010: 40%; b.) TTE 04/20/2014: EF >55%; c.) TTE 11/10/2018: EF 45%; d.) TTE 11/14/2022: EF 25-30%; e.  06/2023 Echo: EF 25-30%, nl RV size/fxn. Mild MR. Mild-mod TR. Ao sclerosis w/o stenosis.   Long term current use of aspirin    Long term current use of clopidogrel    Lumbar degenerative disc disease    Lumbar radiculopathy    Lumbar vertebral fracture (chronic superior endplate of L1)    NSTEMI (non-ST elevated myocardial infarction) (HCC) 01/15/2023   OSA (obstructive sleep apnea)    a.) unable to tolerate nocturnal PAP therapy   Peripheral artery disease (HCC)    a.) stenting 05/14/21: 12 mm x  12 cm LifeStent RIGHT dis SFA/prox pop; b.) s/p cath directed thrombolysis RIGHT SFA/pop 06/14/21; c.) s/p mech thrombectomy + stenting 06/15/21: 8 mm x 25cm & 8 mm x 7.5cm Viabahn; d.) s/p BILAT CFA, profunda femoris, SFA endarterectomies + fogarty embolectomy + stenting 11/15/22: 12mm x 58mm Lifestream BILAT CIAs, 14 mm x 6 cm Lifestream & 13 mm x 5 cm Viabahn LEFT EIA   Peripheral neuropathy    Umbilical hernia     Past Surgical History:  Procedure Laterality Date   AMPUTATION Right 11/18/2022   Procedure: AMPUTATION 4TH AND 5TH RAY;  Surgeon: Linus Galas, [name];  Location: [facility];  Service: Orthopedics/Podiatry;  Laterality: Right;  4th and 5th toe   AMPUTATION Right 04/20/2023   Procedure: AMPUTATION BELOW KNEE;  Surgeon: Bertram Denver, MD;  Location: [facility];  Service: Vascular;  Laterality: Right;  block then general   APPLICATION OF WOUND VAC Right 01/09/2023   Procedure: APPLICATION OF WOUND VAC;  Surgeon: Annice Needy, MD;  Location: [facility];  Service: Vascular;  Laterality: Right;   CATARACT EXTRACTION W/PHACO Right 03/14/2021   Procedure: CATARACT EXTRACTION PHACO AND INTRAOCULAR LENS PLACEMENT (IOC) RIGHT DIABETIC;  Surgeon: Lockie Mola, MD;  Location: [facility];  Service: Ophthalmology;  Laterality: Right;  Diabetic 16.78 01:39.9   CATARACT EXTRACTION W/PHACO Left 03/28/2021   Procedure: CATARACT EXTRACTION PHACO AND INTRAOCULAR LENS PLACEMENT (IOC) LEFT DIABETIC  6.93 01:22.0;  Surgeon: Lockie Mola, MD;  [facility];  Service: Ophthalmology;  Laterality: Left;  Diabetic   COLONOSCOPY     CORONARY ANGIOPLASTY WITH STENT PLACEMENT  03/2010   Procedure: CORONARY ANGIOPLASTY WITH STENT PLACEMENT; Location: [name]   CORONARY STENT INTERVENTION N/A 07/14/2023   Procedure: CORONARY STENT INTERVENTION;  Surgeon: Iran Ouch, MD;  Location: [facility];  Service: Cardiovascular;  Laterality: N/A;   ENDARTERECTOMY FEMORAL Bilateral 11/15/2022   Procedure: BILATERAL COMMON FEMORAL PROFUNDA FEMORIS AND SUPERFICIAL FEMORAL ARTERY ENDARTECTOMIES, RIGHT FOGARTY EMBOLECTOMY OF THE RIGHT SFA  AND  POPLITEAL ARTERIES. AORTAGRAM AND RIGHT LOWER EXTREMITY ANGIOGRAM.;  Surgeon: Annice Needy, MD;  Location: [facility];  Service: Vascular;  Laterality: Bilateral;   INSERTION OF ILIAC STENT Bilateral 11/15/2022   Procedure: BILATERAL STENT INSERTION IN BILATERAL  COMMON ILIAC ARTERY, STENT INSERTION OF LEFT EXTERNAL ILIAC ARTERY. ANGIOPLASTY RIGHT TIBIAL  AND POPLITEAL ARTERY.;  Surgeon: Annice Needy, MD;  Location: [facility];  Service: Vascular;  Laterality: Bilateral;   IRRIGATION AND DEBRIDEMENT KNEE Right 04/21/2023   Procedure: IRRIGATION AND DEBRIDEMENT KNEE;  Surgeon: Campbell Stall, MD;  Location: [facility];  Service: Orthopedics;  Laterality: Right;   LOWER EXTREMITY ANGIOGRAPHY Right 05/14/2021   Procedure: LOWER EXTREMITY ANGIOGRAPHY;  Surgeon: Annice Needy, MD;  Location: [facility];  Service: Cardiovascular;  Laterality: Right;   LOWER EXTREMITY ANGIOGRAPHY Right 06/14/2021   Procedure: Lower Extremity Angiography;  Surgeon: Annice Needy, MD;  Location: [facility];  Service: Cardiovascular;  Laterality: Right;   LOWER EXTREMITY ANGIOGRAPHY Right 11/11/2022   Procedure: Lower Extremity Angiography;  Surgeon: Annice Needy, MD;  Location: [facility];  Service: Cardiovascular;  Laterality: Right;    LOWER EXTREMITY INTERVENTION Right 06/15/2021   Procedure: LOWER EXTREMITY INTERVENTION;  Surgeon: Annice Needy, MD;  Location: [facility];  Service: Cardiovascular;  Laterality: Right;   RIGHT/LEFT HEART CATH AND CORONARY ANGIOGRAPHY N/A 07/14/2023   Procedure: RIGHT/LEFT HEART CATH AND CORONARY ANGIOGRAPHY;  Surgeon: Lorine Bears  A, MD;  Location: [facility];  Service: Cardiovascular;  Laterality: N/A;   TONSILLECTOMY     TRANSMETATARSAL AMPUTATION Right 02/17/2023   Procedure: TRANSMETATARSAL AMPUTATION;  Surgeon: Rosetta Posner, [name];  Location: [facility];  Service: Orthopedics/Podiatry;  Laterality: Right;   WOUND DEBRIDEMENT Right 01/09/2023   Procedure: DEBRIDEMENT WOUND;  Surgeon: Annice Needy, MD;  Location: [facility];  Service: Vascular;  Laterality: Right;    Family History  Problem Relation Age of Onset   Scoliosis Mother    Heart disease Father     Social History   Socioeconomic History   Marital status: Married    Spouse name: [name]   Number of children: 3   Years of education: Not on file   Highest education level: Not on file  Occupational History   Not on file  Tobacco Use   Smoking status: Never    Passive exposure: Never   Smokeless tobacco: Never  Vaping Use   Vaping status: Never Used  Substance and Sexual Activity   Alcohol use: Yes    Alcohol/week: 21.0 standard drinks of alcohol    Types: 7 Glasses of wine, 14 Cans of beer per week    Comment: occassional   Drug use: No   Sexual activity: Not on file  Other Topics Concern   Not on file  Social History Narrative   Not on file   Social Drivers of Health   Financial Resource Strain: Low Risk  (07/11/2023)   Overall Financial Resource Strain (CARDIA)    Difficulty of Paying Living Expenses: Not hard at all  Food Insecurity: No Food Insecurity (07/10/2023)   Hunger Vital Sign    Worried About Running Out of Food in the Last Year: Never true    Ran Out of Food in the Last Year:  Never true  Transportation Needs: No Transportation Needs (07/11/2023)   PRAPARE - Administrator, Civil Service (Medical): No    Lack of Transportation (Non-Medical): No  Physical Activity: Not on file  Stress: Not on file  Social Connections: Unknown (07/10/2023)   Social Connection and Isolation Panel [NHANES]    Frequency of Communication with Friends and Family: More than three times a week    Frequency of Social Gatherings with Friends and Family: More than three times a week    Attends Religious Services: Patient declined    Database administrator or Organizations: No    Attends Banker Meetings: Patient declined    Marital Status: Married  Catering manager Violence: Not At Risk (07/10/2023)   Humiliation, Afraid, Rape, and Kick questionnaire    Fear of Current or Ex-Partner: No    Emotionally Abused: No    Physically Abused: No    Sexually Abused: No    ROS      Objective   BP (!) 146/76 (BP Location: Right Arm, Patient Position: Sitting, Cuff Size: Normal)   Pulse 70   Temp (!) 97.4 F (36.3 C) (Oral)   Resp 18   Ht 5' 7.99" (1.727 m)   Wt 184 lb (83.5 kg)   SpO2 93%   BMI 27.99 kg/m    Physical Exam Vitals and nursing note reviewed.  Constitutional:      Appearance: Normal appearance.  HENT:     Head: Normocephalic and atraumatic.     Comments: Seated in  a wheelchair.  Right BKA.  Two very small scabs on the medial aspect of stump.  Yellow crusting scabs.  Eyes:     Conjunctiva/sclera: Conjunctivae normal.  Cardiovascular:     Rate and Rhythm: Normal rate and regular rhythm.  Pulmonary:     Effort: Pulmonary effort is normal.     Breath sounds: Normal breath sounds.  Musculoskeletal:     Right lower leg: No edema.     Left lower leg: No edema.  Skin:    General: Skin is warm and dry.  Neurological:     Mental Status: He is alert and oriented to person, place, and time.  Psychiatric:        Mood and Affect: Mood normal.         Behavior: Behavior normal.        Thought Content: Thought content normal.        Judgment: Judgment normal.            The ASCVD Risk score (Arnett DK, et al., 2019) failed to calculate for the following reasons:   Risk score cannot be calculated because patient has a medical history suggesting prior/existing ASCVD     Assessment & Plan:  Insulin dependent type 2 diabetes mellitus (HCC) Assessment & Plan: A1c is 8.0.  Asked him to stop the 70/30 insulin and TRIAL Lantus 10 units daily.  Would like to see his FBS at 150 or less.    Orders: -     POCT glycosylated hemoglobin (Hb A1C) -     Lantus SoloStar; Inject 10 Units into the skin daily.  Dispense: 15 mL; Refill: PRN  Depression, major, single episode, moderate (HCC) Assessment & Plan: Is taking remeron 30mg  daily and appetite has not improved and his insomnia is no better.  Increase Remeron to 45mg  at bedtime.  FU for recheck in a month  Orders: -     Mirtazapine; Take 1 tablet (45 mg total) by mouth at bedtime.  Dispense: 30 tablet; Refill: 3  Primary insomnia Assessment & Plan: Has insomnia.  Trazodone did not work.  Melatonin had no effect.  On mirtazapine 30mg  and reports he does not sleep well.  Increase to Mirtazapine 45mg  QHS   Amputation stump complication (HCC) Assessment & Plan: Has two very small wounds on his stump.  Both are less than 1 cm with yellow crusting discharge.  Asked him to use neosporin and a bandaid.  Please continue to use the compression sleeve.  Will recheck in a month.  If failure to improve will refer to Wound Care.  Important to get stump healed because he cannot have the prosthetic until the wounds are cleared.      Decreased appetite Assessment & Plan: He reports that he is eating about a third of what he normally would eat.  He is skipping meals.  Please use Boost to supplement a meal if you skip it.  On Remeron which should improve his appetite     Return in about 4 weeks  (around 08/25/2023).   Alease Medina, MD

## 2023-07-28 NOTE — Assessment & Plan Note (Signed)
 A1c is 8.0.  Asked him to stop the 70/30 insulin and TRIAL Lantus 10 units daily.  Would like to see his FBS at 150 or less.

## 2023-07-28 NOTE — Assessment & Plan Note (Signed)
 Is taking remeron 30mg  daily and appetite has not improved and his insomnia is no better.  Increase Remeron to 45mg  at bedtime.  FU for recheck in a month

## 2023-07-28 NOTE — Assessment & Plan Note (Signed)
 He reports that he is eating about a third of what he normally would eat.  He is skipping meals.  Please use Boost to supplement a meal if you skip it.  On Remeron which should improve his appetite

## 2023-07-28 NOTE — Assessment & Plan Note (Signed)
 Has two very small wounds on his stump.  Both are less than 1 cm with yellow crusting discharge.  Asked him to use neosporin and a bandaid.  Please continue to use the compression sleeve.  Will recheck in a month.  If failure to improve will refer to Wound Care.  Important to get stump healed because he cannot have the prosthetic until the wounds are cleared.

## 2023-07-28 NOTE — Assessment & Plan Note (Signed)
 Has insomnia.  Trazodone did not work.  Melatonin had no effect.  On mirtazapine 30mg  and reports he does not sleep well.  Increase to Mirtazapine 45mg  QHS

## 2023-08-01 ENCOUNTER — Ambulatory Visit: Payer: Self-pay

## 2023-08-01 ENCOUNTER — Telehealth: Admitting: Family Medicine

## 2023-08-01 DIAGNOSIS — M792 Neuralgia and neuritis, unspecified: Secondary | ICD-10-CM | POA: Diagnosis not present

## 2023-08-01 MED ORDER — GABAPENTIN 300 MG PO CAPS
300.0000 mg | ORAL_CAPSULE | Freq: Three times a day (TID) | ORAL | 3 refills | Status: DC
Start: 2023-08-01 — End: 2023-08-05

## 2023-08-01 NOTE — Progress Notes (Signed)
 Virtual Visit via Video Note  I connected with Manuel [name] on 08/01/23 at  1:50 PM EDT by a video enabled telemedicine application and verified that I am speaking with the correct person using two identifiers.  Location: Patient: Manuel [name]. [place] Provider: Cheri Kearns, MD   I discussed the limitations of evaluation and management by telemedicine and the availability of in person appointments. The patient expressed understanding and agreed to proceed.  History of Present Illness:  His foot hurts and he could not sleep.  He took his regular pain medicine, hydromorphone 2 mg, along with three 325 mg aspirins and got no relief of pain.Marland Kitchen  He is having pain in his left foot that lances.  He will have a very intense burning lancing pain that last about a minute and then it will go away for about 10 minutes before the next pain.  He did have some pain in his phantom limb but that is not what kept him awake it was the pain in his actual left foot that caused the problem.  He has no trauma to speak of.  There is slight swelling in his left leg.  His foot is warm to the touch.  There is no redness or rash.  The outside edge of his foot was where most of the pain was and then it moved to his great and second toe.  He took hydromorphone 2 mg and three 325mg    aspirins again at 2 AM because he was in such pain.  He never went to sleep because of the pain.  He takes mirtazapine 45 mg nightly but it did not help him sleep last night.  He also reports lancing neuropathic pain in his fingers and thumbs at times.  Tends not to have much pain at all during the day and has most of it at night.    Observations/Objective:   Assessment and Plan:  78 yo gentleman with CAD (s/p PCI to RCA with overlapping stents, NSTEMI 3/25, peak troponin 1045, three DES to LAD), ischemic cardiomyopathy (7/24 LVEF 25-35%, 3/25 echo LVEF 25-35% moderate dilatation LV), PAD (2011 s/p PTA L common iliac, right peroneal, left distal SFA,  proximal popliteal, stent to right distal SFA), s/p right BKA, gout, DMT2, mixed hyperlipidemia, HTN, depression/anxiety, insomnia, lumbar DDD with uncontrolled peripheral neuropathy.  Trial of gabapentin 300 mg at bedtime.  FU next week.      Follow Up Instructions:    I discussed the assessment and treatment plan with the patient. The patient was provided an opportunity to ask questions and all were answered. The patient agreed with the plan and demonstrated an understanding of the instructions.   The patient was advised to call back or seek an in-person evaluation if the symptoms worsen or if the condition fails to improve as anticipated.  I provided 8 minutes of non-face-to-face time during this encounter.   Alease Medina, MD

## 2023-08-01 NOTE — Telephone Encounter (Signed)
  Chief Complaint: left foot pain right foot phantom pain Symptoms: sever pain, intermittnet (every 2-3 minutes) unable to sleep for the past 2 nights Frequency: ongoing but worsened Pertinent Negatives: Patient denies SOB, CP rash, swelling  Disposition: [] ED /[] Urgent Care (no appt availability in office) / [x] Appointment(In office/virtual)/ []  [facility]/ [] Home Care/ [] Refused Recommended Disposition /[] [facility]/ []  Follow-up with PCP Additional Notes: Dilaudid ineffective Copied from CRM [id number]. Topic: Clinical - Red Word Triage >> Aug 01, 2023 11:14 AM Ivette P wrote: [facility] that prompted transfer to Nurse Triage: Phantom pain, amputated leg, no sleep. Reason for Disposition  [1] SEVERE pain (e.g., excruciating, unable to do any normal activities) AND [2] not improved after 2 hours of pain medicine  Answer Assessment - Initial Assessment Questions 1. ONSET: "When did the pain start?"      Ongoing  2. LOCATION: "Where is the pain located?"      3. PAIN: "How bad is the pain?"    (Scale 1-10; or mild, moderate, severe)   -  MILD (1-3): doesn't interfere with normal activities    -  MODERATE (4-7): interferes with normal activities (e.g., work or school) or awakens from sleep, limping    -  SEVERE (8-10): excruciating pain, unable to do any normal activities, unable to walk     10 4. WORK OR EXERCISE: "Has there been any recent work or exercise that involved this part of the body?"      *No Answer* 5. CAUSE: "What do you think is causing the leg pain?"     *No Answer* 6. OTHER SYMPTOMS: "Do you have any other symptoms?" (e.g., chest pain, back pain, breathing difficulty, swelling, rash, fever, numbness, weakness)     Comes on at night in foot  intermittent every 2-3 minutes  Protocols used: Leg Pain-A-AH

## 2023-08-05 ENCOUNTER — Ambulatory Visit: Payer: Self-pay

## 2023-08-05 ENCOUNTER — Telehealth: Payer: Self-pay | Admitting: Family Medicine

## 2023-08-05 ENCOUNTER — Other Ambulatory Visit: Payer: Self-pay | Admitting: Family Medicine

## 2023-08-05 DIAGNOSIS — G6289 Other specified polyneuropathies: Secondary | ICD-10-CM

## 2023-08-05 MED ORDER — GABAPENTIN 100 MG PO CAPS: ORAL_CAPSULE | ORAL | 3 refills | Status: DC

## 2023-08-05 NOTE — Telephone Encounter (Signed)
 E2C2 incoming call; Patient called reporting glucose reading is 368 wanting to know what can he do.  Call transferred to doctor's CMA

## 2023-08-05 NOTE — Telephone Encounter (Signed)
 Call was routed to PCP office Copied from CRM [id number]. Topic: Clinical - Red Word Triage >> Aug 05, 2023  7:58 AM Zina Hilts wrote: Red Word that prompted transfer to Nurse Triage: patient has blood sugar level of 368 Reason for Disposition  Caller has already spoken with the PCP and has no further questions.  Protocols used: No Contact or Duplicate Contact Call-A-AH

## 2023-08-05 NOTE — Telephone Encounter (Signed)
 Spoke to [employer].  He is sleepy and has not gotten up today.  May be that the gabapentin is making him tired.  We could try a lower dosage of gabapentin 100mg  and try two at bedtime.  See if that is enough to control his pain so he can sleep but not so mucha as to make him tired.   He took the Farxiga and Lantus 15 units but BG remains in the 350 range.  He takes a 70/30 Novolog that he gets OTC at [facility].  Asked him to go back to 70/30 Novolog, 30 units twice a day.  We may work on getting him on the lantus and Humalog at another time.  Please keep me posted about his BS readings.

## 2023-08-05 NOTE — Telephone Encounter (Signed)
 Copied from CRM [id number]. Topic: Clinical - Medical Advice >> Aug 05, 2023  3:46 PM Ivette P wrote: Reason for CRM: [name] called back to let her know glucose levels were, they have been staying steady after medicaiton between 350-370, Do I (wife) need to give more medicaiton. Husband is always tired.   callback [phone]

## 2023-08-05 NOTE — Telephone Encounter (Signed)
 Spoke with wife, Manuel [name] and was on speaker phone .  Has not taken any insulin today and BS is 352 with DexCom.  Please take Lantus 15 units and Farxiga 10.  Please call back later today and let me know what BG is.  Can expect BG will be high as we titrate the Lantus up.   Took the Gabapentin 300mg  at bedtime and has slept the last 2 night.  Has not needed to take Diluadid 2mg  at all.  The pain in his left foot was well controlled.

## 2023-08-05 NOTE — Telephone Encounter (Signed)
 Patient's wife states that husbands Dexcom reading is currently 34 and needs to know if she should give him more insulin. Dr. [name] has been made aware and will advise patient once she reviews his chart. Patient's wife verbalized understanding.

## 2023-08-11 ENCOUNTER — Encounter: Admitting: Family

## 2023-08-13 ENCOUNTER — Telehealth: Payer: Self-pay

## 2023-08-13 NOTE — Telephone Encounter (Signed)
 Copied from CRM [id number]. Topic: Clinical - Medication Question >> Aug 13, 2023  9:38 AM Star East wrote: Reason for CRM: Patient still having trouble sleeping, would like to try something else, please call [phone]

## 2023-08-13 NOTE — Telephone Encounter (Signed)
 Spoke with Manuel [name] and [employer].  He is sleeping very poorly.  Did not sleep at all Monday night and finally went to sleep Tuesday at 5 am but was up by 7 am.  Takes naps during the day, anywhere from 1 to 3 hours.  Is out of Dilaudid .  Is taking trazadone 75mg , remeron  30mg  and gabapentin  200mg  at bedtime.  The phantom pains are better.  Now a 3 on a scale of 1-10.   Feels down and depressed lately.  Has passive SI.  Feels that he would be better off dead and feels that he is a burden to his partner Manuel [name].  He is looking forward to getting his prosthesis.  He follows up weekly with the prosthetic people. Suggested he should increase the gabapentin  to 300 mg and see if that improves his sleep.

## 2023-08-14 ENCOUNTER — Telehealth: Payer: Self-pay | Admitting: Family

## 2023-08-14 NOTE — Telephone Encounter (Signed)
 Called to confirm/remind patient of their appointment at the [facility] on 08/15/23.   Appointment:   [] Confirmed  [x] Left mess   [] No answer/No voice mail  [] VM Full/unable to leave message  [] Phone not in service  Patient reminded to bring all medications and/or complete list.  Confirmed patient has transportation. Gave directions, instructed to utilize valet parking.

## 2023-08-14 NOTE — Progress Notes (Deleted)
 [facility] Note   Referring Physician: recent admission PCP: [name], Lacinda Pica, MD (last seen 04/25) Cardiologist: Dorita Garter, MD (to be seen 04/25)  Chief Complaint:   HPI:  [name] is a 78 y/o male with a history of CAD, PAD s/p right BKA, gout, DM, hyperlipidemia, HTN, depression, anxiety, insomnia and chronic heart failure. CAD s/p PCI to [facility] with overlapping stents in 2011. extensive PAD s/p PTA L common Iliac, R peroneal, R distal SFA/prox popliteal, and stent to R distal SFA/prox popliteal arteries (05/14/21). Echo in 10/2022 with LVEF of 25-30%.   Admitted 07/09/23 due to shortness of breath, decreased urine output, and inability to sleep for the last year. Found to have NSTEMI with peak troponin of 1045. Echo 06/2023 with LVEF 25-30%, moderately dilated LV, and mild valvular disease. LHC 06/2023 where 3 DES were placed to the LAD. Chest x-ray noted mild diffuse vascular congestion, bilateral trace pleural effusions, concerning for CHF.   He was seen in the HF clinic 07/21/23 and he was given 3 doses of metolazone  2.5mg  and 3 doses of potassium 40meq.   He presents today, with his girlfriend, for a HF follow-up visit with a chief complaint of   Drinks a 12 ounce can of beer daily. No liquor in the last 4-5 months, denies tobacco/ drug use.    ROS: All systems negative except what is listed in HPI, PMH and Problem List  Past Medical History:  Diagnosis Date   Acute ST elevation myocardial infarction (STEMI) of inferior wall (HCC) 04/19/2010   a.) transfered from [facility] to [facility] --> LHC/PCI (very difficult procedure) --> 3.0 x 23 mm and 3.0 x 12 mm Xience stents to RCA   Allergies    Arthritis    Benign essential hypertension    Bilateral carotid artery disease (HCC) 05/08/2021   a.) carotid doppler 05/08/2021: 1-39% BICA   CAD (coronary artery disease) 04/19/2010   a.) inferior STEMI 04/19/2010 --> LHC/PCI: 50-70% pD1, 80% pRI, 90/90/90% RCA (overlapping  3.0 x 23 and 3.0 x 12 mm Xience DES); b.) MV 11/10/2018: fixed minimally reversible inferior perfusion defect   Cellulitis of foot    Chronic HFrEF (heart failure with reduced ejection fraction) (HCC)    DDD (degenerative disc disease), cervical    Diabetes mellitus type 2, insulin  dependent (HCC)    Diverticulosis    Full dentures    Gout    Hard of hearing    History of bilateral cataract extraction 2022   History of ETOH abuse    Hyperlipidemia    Ischemic cardiomyopathy 04/19/2010   a.) TTE 04/19/2010: 40%; b.) TTE 04/20/2014: EF >55%; c.) TTE 11/10/2018: EF 45%; d.) TTE 11/14/2022: EF 25-30%; e. 06/2023 Echo: EF 25-30%, nl RV size/fxn. Mild MR. Mild-mod TR. Ao sclerosis w/o stenosis.   Long term current use of aspirin     Long term current use of clopidogrel     Lumbar degenerative disc disease    Lumbar radiculopathy    Lumbar vertebral fracture (chronic superior endplate of L1)    NSTEMI (non-ST elevated myocardial infarction) (HCC) 01/15/2023   OSA (obstructive sleep apnea)    a.) unable to tolerate nocturnal PAP therapy   Peripheral artery disease (HCC)    a.) stenting 05/14/21: 12 mm x 12 cm LifeStent RIGHT dis SFA/prox pop; b.) s/p cath directed thrombolysis RIGHT SFA/pop 06/14/21; c.) s/p mech thrombectomy + stenting 06/15/21: 8 mm x 25cm & 8 mm x 7.5cm Viabahn; d.) s/p BILAT CFA, profunda femoris,  SFA endarterectomies + fogarty embolectomy + stenting 11/15/22: 12mm x 58mm Lifestream BILAT CIAs, 14 mm x 6 cm Lifestream & 13 mm x 5 cm Viabahn LEFT EIA   Peripheral neuropathy    Umbilical hernia     Current Outpatient Medications  Medication Sig Dispense Refill   aspirin  EC 81 MG tablet Take 1 tablet (81 mg total) by mouth daily. 150 tablet 2   atorvastatin  (LIPITOR ) 80 MG tablet Take 1 tablet (80 mg total) by mouth at bedtime. 30 tablet 6   carvedilol  (COREG ) 25 MG tablet Take 25 mg by mouth 2 (two) times daily with a meal.     clopidogrel  (PLAVIX ) 75 MG tablet Take 1 tablet (75  mg total) by mouth daily. 30 tablet 11   dapagliflozin  propanediol (FARXIGA ) 10 MG TABS tablet Take 1 tablet (10 mg total) by mouth daily. 30 tablet 2   Ensure Max Protein (ENSURE MAX PROTEIN) LIQD Take 330 mLs (11 oz total) by mouth 2 (two) times daily. (Patient taking differently: Take 11 oz by mouth daily.) 10000 mL 3   ferrous sulfate 325 (65 FE) MG EC tablet Take 325 mg by mouth daily with breakfast.     folic acid  (FOLVITE ) 1 MG tablet Take 1 tablet (1 mg total) by mouth daily. 90 tablet 0   furosemide  (LASIX ) 40 MG tablet Take 1 tablet (40 mg total) by mouth daily. 30 tablet 2   gabapentin  (NEURONTIN ) 100 MG capsule 2 po at bedtime. 60 capsule 3   insulin  aspart protamine - aspart (NOVOLOG  MIX 70/30) (70-30) 100 UNIT/ML injection Inject 30 Units into the skin 2 (two) times daily with a meal.     insulin  glargine (LANTUS  SOLOSTAR) 100 UNIT/ML Solostar Pen Inject 10 Units into the skin daily. 15 mL PRN   methocarbamol  (ROBAXIN ) 500 MG tablet Take 500 mg by mouth every 8 (eight) hours as needed for muscle spasms.     metolazone  (ZAROXOLYN ) 2.5 MG tablet Take 1 tablet (2.5 mg total) by mouth daily for 3 days. Take 30 minutes before your lasix . 3 tablet 3   mirtazapine  (REMERON ) 30 MG tablet Take 1 tablet by mouth at bedtime.     mirtazapine  (REMERON ) 45 MG tablet Take 1 tablet (45 mg total) by mouth at bedtime. 30 tablet 3   nitroGLYCERIN  (NITROSTAT ) 0.4 MG SL tablet Place 1 tablet (0.4 mg total) under the tongue every 5 (five) minutes as needed for chest pain. Max: 3 tablets per day 100 tablet 3   ondansetron  (ZOFRAN ) 4 MG tablet Take 1 tablet (4 mg total) by mouth every 6 (six) hours as needed for nausea.     polyethylene glycol (MIRALAX  / GLYCOLAX ) 17 g packet Take 17 g by mouth daily as needed for mild constipation. 14 each 0   potassium chloride  SA (KLOR-CON  M) 20 MEQ tablet Take 2 tablets (40 mEq total) by mouth daily. 6 tablet 0   sacubitril -valsartan  (ENTRESTO ) 24-26 MG Take 1 tablet by  mouth every 12 (twelve) hours.     senna-docusate (SENOKOT-S) 8.6-50 MG tablet Take 1 tablet by mouth at bedtime as needed for mild constipation. 30 tablet 0   spironolactone  (ALDACTONE ) 25 MG tablet Take 1 tablet (25 mg total) by mouth daily. 30 tablet 2   thiamine  (VITAMIN B-1) 100 MG tablet Take 1 tablet (100 mg total) by mouth daily. 90 tablet 0   traZODone  (DESYREL ) 150 MG tablet Take 75 mg by mouth at bedtime.     No current facility-administered medications for this  visit.    No Known Allergies    Social History   Socioeconomic History   Marital status: Married    Spouse name: [name]   Number of children: 3   Years of education: Not on file   Highest education level: Not on file  Occupational History   Not on file  Tobacco Use   Smoking status: Never    Passive exposure: Never   Smokeless tobacco: Never  Vaping Use   Vaping status: Never Used  Substance and Sexual Activity   Alcohol use: Yes    Alcohol/week: 21.0 standard drinks of alcohol    Types: 7 Glasses of wine, 14 Cans of beer per week    Comment: occassional   Drug use: No   Sexual activity: Not on file  Other Topics Concern   Not on file  Social History Narrative   Not on file   Social Drivers of Health   Financial Resource Strain: Low Risk  (07/11/2023)   Overall Financial Resource Strain (CARDIA)    Difficulty of Paying Living Expenses: Not hard at all  Food Insecurity: No Food Insecurity (07/10/2023)   Hunger Vital Sign    Worried About Running Out of Food in the Last Year: Never true    Ran Out of Food in the Last Year: Never true  Transportation Needs: No Transportation Needs (07/11/2023)   PRAPARE - Administrator, Civil Service (Medical): No    Lack of Transportation (Non-Medical): No  Physical Activity: Not on file  Stress: Not on file  Social Connections: Unknown (07/10/2023)   Social Connection and Isolation Panel [NHANES]    Frequency of Communication with Friends and Family:  More than three times a week    Frequency of Social Gatherings with Friends and Family: More than three times a week    Attends Religious Services: Patient declined    Database administrator or Organizations: No    Attends Banker Meetings: Patient declined    Marital Status: Married  Catering manager Violence: Not At Risk (07/10/2023)   Humiliation, Afraid, Rape, and Kick questionnaire    Fear of Current or Ex-Partner: No    Emotionally Abused: No    Physically Abused: No    Sexually Abused: No      Family History  Problem Relation Age of Onset   Scoliosis Mother    Heart disease Father        PHYSICAL EXAM:  General: Well appearing. No resp difficulty HEENT: normal Neck: supple, no JVD Cor: Regular rhythm, rate. No rubs, gallops or murmurs Lungs: clear Abdomen: soft, nontender, nondistended. Extremities: no cyanosis, clubbing, rash, 2+ pitting edema left lower leg up to knee; right BKA Neuro: alert & oriented X 3. Moves all 4 extremities w/o difficulty. Affect pleasant   ECG: not done   ASSESSMENT & PLAN:  1: ICM with reduced ejection fraction- - NYHA class II - fluid up with worsening symptoms and pedal edema - Echo 07/10/23 with LVEF 25-30%, moderately dilated LV, and mild valvular disease.  - continue carvedilol  25mg  BID - continue farxiga  10mg  daily - continue furosemide  40mg  daily - continue entresto  24/26mg  BID - continue spironolactone  25mg  daily - took metolazone  2.5mg  daily X 3 doses with 40meq potassium daily X 3 doses - get compression sock and put on left leg - doesn't add salt to his food but does eat out 3/4 of the time; reviewed how to make low sodium choices - proBNP 07/21/23 was 9804  2: HTN- - BP  - saw PCP ([name]) 04/25 - BMET 07/21/23 reviewed: sodium 137, potassium 4.4, creatinine 1.08 & GFR 70  3: CAD- - was previously followed at [facility] but is now being followed by [facility] (per his request) - does not have  post-hospital f/u so referral placed today - [facility] 07/14/23: Severe three-vessel coronary artery disease. Right heart catheterization showed moderately elevated right and left-sided filling pressures, moderate to severe pulmonary hypertension and moderately reduced cardiac output. Successful angioplasty and 3 overlapped drug-eluting stent placement to the LAD. - continue atorvastatin  80mg  daily  4: DM- - A1c 07/10/23 was 8.0% - continue insulin   5: PAD- - saw vascular (Dew) 02/25 - s/p PTA L common Iliac, R peroneal, R distal SFA/prox popliteal, and stent to R distal SFA/prox popliteal arteries (05/14/21) - continue plavix  75mg  daiy     Charlette Console, FNP 08/14/23

## 2023-08-15 ENCOUNTER — Telehealth: Payer: Self-pay | Admitting: Family

## 2023-08-15 ENCOUNTER — Encounter: Admitting: Family

## 2023-08-15 NOTE — Telephone Encounter (Signed)
 Patient did not show for his Heart Failure Clinic appointment on 08/15/23.

## 2023-08-25 ENCOUNTER — Ambulatory Visit: Payer: Self-pay

## 2023-08-25 NOTE — Telephone Encounter (Signed)
  Chief Complaint: insomnia Symptoms: not sleeping in the past 2 nights, fatigue Frequency: 2 nights  Pertinent Negatives: Patient denies SI, HI,  Disposition: [] ED /[] Urgent Care (no appt availability in office) / [x] Appointment(In office/virtual)/ []  [facility]/ [] Home Care/ [] Refused Recommended Disposition /[] [facility]/ []  Follow-up with PCP Additional Notes: Patients wife reports patient has been experiencing insomnia the past 2 nights. Wife reports patient has has issues with this "for a while". Wife reports patient is denying stress, but states she is concerned for his mental health, as well. Patient has hx of depression. Wife is reporting that patient is "exhausted" and feels like the lack of sleep is making it worse. Wife reports patient is very fatigued and doesn't want to do much. Wife denies concerns for SI, HI. Per protocol, appt scheduled tomorrow next available 08/25/23. Wife advised to call back with worsening symptoms. Wife verbalized understanding.    Copied from CRM [id number]. Topic: Clinical - Red Word Triage >> Aug 25, 2023  8:34 AM Elle L wrote: Red Word that prompted transfer to Nurse Triage: The patient's wife, Manuel [name], states that the patient is not sleeping and he has not slept for the past two nights and his mental health could be a part of it. Reason for Disposition  Insomnia interferes with work or school  Answer Assessment - Initial Assessment Questions 1. DESCRIPTION: "Tell me about your sleeping problem."      "He says he doesn't sleep at all" 2. ONSET: "How long have you been having trouble sleeping?" (e.g., days, weeks, months)     2 days 3. RECURRENT: "Have you had sleeping problems before?"  If Yes, ask: "What happened that time?" "What helped your sleeping problem go away in the past?"      Yes, ongoing for a "long time" 4. STRESS: "Is there anything in your life that is making you feel stressed or tense?"     "He says he has not  stress" 5. PAIN: "Do you have any pain that is keeping you awake?" (e.g., back pain, headache, abdomen pain)     denies 6. CAFFEINE ABUSE: "Do you drink caffeinated beverages, and how much each day?" (e.g., coffee, tea, colas)     denies 7. ALCOHOL USE OR SUBSTANCE USE (DRUG USE): "Do you drink alcohol or use any illegal drugs?"     denies 8. OTHER SYMPTOMS: "Do you have any other symptoms?"  (e.g., difficulty breathing)    Fatigue, hx of depression  Protocols used: Insomnia-A-AH

## 2023-08-25 NOTE — Progress Notes (Deleted)
 NO SHOW

## 2023-08-26 ENCOUNTER — Ambulatory Visit: Admitting: Family Medicine

## 2023-08-26 ENCOUNTER — Telehealth: Admitting: Family Medicine

## 2023-08-26 ENCOUNTER — Ambulatory Visit: Admitting: Cardiovascular Disease

## 2023-08-26 DIAGNOSIS — I10 Essential (primary) hypertension: Secondary | ICD-10-CM

## 2023-08-26 DIAGNOSIS — E782 Mixed hyperlipidemia: Secondary | ICD-10-CM

## 2023-08-26 DIAGNOSIS — I2510 Atherosclerotic heart disease of native coronary artery without angina pectoris: Secondary | ICD-10-CM

## 2023-08-26 DIAGNOSIS — I6523 Occlusion and stenosis of bilateral carotid arteries: Secondary | ICD-10-CM

## 2023-08-26 DIAGNOSIS — I5042 Chronic combined systolic (congestive) and diastolic (congestive) heart failure: Secondary | ICD-10-CM

## 2023-08-26 DIAGNOSIS — E1151 Type 2 diabetes mellitus with diabetic peripheral angiopathy without gangrene: Secondary | ICD-10-CM

## 2023-08-26 DIAGNOSIS — I739 Peripheral vascular disease, unspecified: Secondary | ICD-10-CM

## 2023-08-26 DIAGNOSIS — G6289 Other specified polyneuropathies: Secondary | ICD-10-CM

## 2023-08-26 MED ORDER — HYDROMORPHONE HCL 2 MG PO TABS: 2.0000 mg | ORAL_TABLET | Freq: Four times a day (QID) | ORAL | 0 refills | Status: AC | PRN

## 2023-08-26 NOTE — Progress Notes (Signed)
 Established Patient Office Visit  Subjective   Patient ID: Manuel [name], male    DOB: Nov 27, 1945  Age: 78 y.o. MRN: [id number]  No chief complaint on file.   HPI Virtual Visit via Video Note  I connected with Cassius Client on 08/26/23 at 11:10 AM EDT by a video enabled telemedicine application and verified that I am speaking with the correct person using two identifiers.  Location: Patient: Manuel [name]. [facility] Provider: Aundrey Elahi K. Yliana Gravois. MD   I discussed the limitations of evaluation and management by telemedicine and the availability of in person appointments. The patient expressed understanding and agreed to proceed.  History of Present Illness: Delightful 78 year old male gentleman with CAD (s/p PCI to RCA with overlapping stents, NSTEMI 3/25, peak troponin 1045, 3 DES to LAD), ischemic cardiomyopathy (7/24 LVEF 25 to 35%, 325 echo LVEF 25 to 35% moderate dilatation LV), PAD (2011 s/p PTA L common iliac, right peritoneal, left distal SFA, proximal popliteal, stent to right distal SFA), s/p right BKA, gout, DMT2, mixed hyperlipidemia, HTN, depression/anxiety, insomnia, lumbar DDD with uncontrolled painful peripheral neuropathy.   Has painful peripheral neuropathy that keeps him awake at night.  Does not have much pain during the day but when he lies down to go to sleep he gets lancing and burning sensations that are both phantom and from his left leg.  Reports he has not slept in days.  Sleeps 1 to 2 hours and is poor sleep.  Then he is tired and sleepy all day and tries to catch a nap for 1 or 2 hours during the day.  Has been taking gabapentin  300 mg, mirtazapine  45 mg and trazodone  75mg  (half of 150).  Again reports very little pain during the day but "screaming pain" at night.  Both his legs "hurt like hell".  He takes 2 aspirin  when he goes to bed at night for the pain.  He wears his oxygen when he is sleeping.  He denies PND, orthopnea, CP, peripheral edema.   He is on 70/30  Humalog  30 units twice a day.  Spoke with Rendell Carrel, his spouse and she reports his blood sugars are 300 or less every time she checks. Observations/Objective:  PDMP last filled hydromorphone  2mg  05/08/2023 number 120 and 05/09/2023 #20.  Assessment and Plan:  Insomnia: Do not feel comfortable giving him a benzodiazepine because he has CHF and is on oxygen.  Has taken dilaudid  2 mg and it helped.  Dilaudid  2 mg at bedtime, Stay on trazodone  75 mg, gabapentin  300 mg, mirtazapine  45 mg at bedtime.  Contact me in a couple of days.  May consider switching trazodone  for doxepin.      Insulin  dependent diabetes:  BG running 300 or less.    On Novolog  70/30 thirty units BID.  Please increase this to 35 units BID.   FOLLOW-UP by phone in 2 days.  Needs a standing monthly appointment.       Follow Up Instructions:    I discussed the assessment and treatment plan with the patient. The patient was provided an opportunity to ask questions and all were answered. The patient agreed with the plan and demonstrated an understanding of the instructions.   The patient was advised to call back or seek an in-person evaluation if the symptoms worsen or if the condition fails to improve as anticipated.  I provided 10 minutes of non-face-to-face time during this encounter.   Donette Furlong, MD      Objective:  Assessment & Plan:  There are no diagnoses linked to this encounter.   No follow-ups on file.    Gussie Towson K Aviv Rota, MD

## 2023-08-27 ENCOUNTER — Ambulatory Visit: Admitting: Family Medicine

## 2023-08-28 ENCOUNTER — Telehealth: Payer: Self-pay | Admitting: Family Medicine

## 2023-08-28 NOTE — Telephone Encounter (Signed)
 Accidentally opened this message

## 2023-08-28 NOTE — Telephone Encounter (Signed)
 Called patient to talk about how he is doing.  He reports that he is not using oxygen at night.  When he is having anxiety he uses oxygen.  "I am not sleeping.  Last night only slept 2 hours."  Last night, I was dreaming and I was aware that I was dreaming.  He is interested in a sleeping pill, like Ambien .  I am so tired.  I take naps during the day.   He is taking melatonin.  Advised to stop taking naps during the day.   When he lays down he gets phantom pain and pain in his left leg.  One dilaudid  stopped 90% of his pain.  But he did not sleep.Then he tells me that two hours after the first dilaudid  he took a second.  After the second pill 90 % of his pain was resolved.   Has been in a wheelchair for 4 months.  His left foot does not look good.  He cut his toenails and knicked his 5th toe and it bleed for 2 hours.

## 2023-08-29 NOTE — Telephone Encounter (Signed)
 Copied from CRM [id number]. Topic: Clinical - Medical Advice >> Aug 29, 2023 12:00 PM Ivette P wrote: Reason for CRM: [name] called in to report that "Deatra Face" the patient is not sleeping still. Pt did not go to bed until 4:30 AM this morning and woke up around 9:30AM and wondering if there can be anything done to help get the sleep he needs.   Callback [phone]

## 2023-09-02 ENCOUNTER — Ambulatory Visit (INDEPENDENT_AMBULATORY_CARE_PROVIDER_SITE_OTHER): Payer: Medicare Other

## 2023-09-02 ENCOUNTER — Telehealth: Payer: Self-pay | Admitting: Family Medicine

## 2023-09-02 ENCOUNTER — Ambulatory Visit (INDEPENDENT_AMBULATORY_CARE_PROVIDER_SITE_OTHER): Payer: Medicare Other | Admitting: Nurse Practitioner

## 2023-09-02 ENCOUNTER — Encounter (INDEPENDENT_AMBULATORY_CARE_PROVIDER_SITE_OTHER): Payer: Self-pay | Admitting: Nurse Practitioner

## 2023-09-02 VITALS — BP 143/79 | HR 71 | Resp 16

## 2023-09-02 DIAGNOSIS — Z9889 Other specified postprocedural states: Secondary | ICD-10-CM | POA: Diagnosis not present

## 2023-09-02 DIAGNOSIS — I739 Peripheral vascular disease, unspecified: Secondary | ICD-10-CM

## 2023-09-02 DIAGNOSIS — Z794 Long term (current) use of insulin: Secondary | ICD-10-CM | POA: Diagnosis not present

## 2023-09-02 DIAGNOSIS — E119 Type 2 diabetes mellitus without complications: Secondary | ICD-10-CM

## 2023-09-02 DIAGNOSIS — I10 Essential (primary) hypertension: Secondary | ICD-10-CM

## 2023-09-02 MED ORDER — DOXYCYCLINE HYCLATE 100 MG PO CAPS: 100.0000 mg | ORAL_CAPSULE | Freq: Two times a day (BID) | ORAL | 0 refills | Status: DC

## 2023-09-02 NOTE — Telephone Encounter (Signed)
 Returned patients call.  Got a call from the prosthesis.  Went to [facility] and his left foot is normal.   Last night did not sleep except maybe 2-3 hours because he had to get up and go to the doctor.  He is fighting sleeping right now.  He watches TV all night.  He is taking gabapentin  and it is not doing anything for sleep.  If he is awake al night and sleeps all day then that is what he will do.   He still has screaming pain in both his legs at night.  He takes two hydromorphone  2mg  then he is not in screaming pain.  Lancing pain for a few seconds over and over at night.  Advised that he cannot have dilaudid  refilled.  He plans to take ASA two to three 325 mg if he cannot get dilaudid .  Asked him to try tylenol  1000 mg at bedtime instead. " It is a fake medication."  Discussed that ASA especially at that dosage is not safe.  He agrees to try tylenol  1000mg  at bedtime for the next three nights.    He has been in his wheel chair for 4.5 months and he fell the other day.  He stood up out of  the wheelchair.  He was not holding on to anything substantial and when he lost his balance he could not recover.  He landed on his buttocks on a wooden floor.  He scraped his legs.  He was able to get up and stand.  [name] helped him get up.  He got on his hands and knees and was able to get up from there.   He does not  have any lingering pain from this.

## 2023-09-03 LAB — VAS US ABI WITH/WO TBI: Left ABI: 1.01

## 2023-09-04 NOTE — Progress Notes (Deleted)
 Cardiology Office Note   Date:  09/04/2023   ID:  [name], Manuel 1945/07/04, MRN [id number]  PCP:  [name], [name], MD  Cardiologist:   Antionette Kirks, MD   No chief complaint on file.     History of Present Illness: Manuel [name] is a 78 y.o. male who is here today for a follow-up visit regarding coronary artery disease and chronic systolic heart failure. He has history of coronary artery disease status post PCI and to overlap drug-eluting stent placement to the right coronary artery in 2011, extensive peripheral arterial disease with bilateral lower extremity endovascular interventions status post right BKA in 2024, chronic systolic heart failure, essential hypertension, hyperlipidemia, type 2 diabetes, obstructive sleep apnea, depression and alcohol abuse.  He was hospitalized in March with non-STEMI.  He was previously followed by [name] but we were consulted for a second opinion.  Echocardiogram showed an EF of 20 to 25%.  I proceeded with cardiac catheterization which showed severe three-vessel coronary artery disease.  Right heart catheterization showed moderately elevated right and left-sided filling pressures, moderate to severe pulmonary hypertension and moderately reduced cardiac output.  He was felt to be not a CABG candidate.  I performed successful angioplasty and 3 overlapped drug-eluting stent placement to the left anterior descending artery.  The procedure was difficult due to tortuosity and calcifications.    Past Medical History:  Diagnosis Date   Acute ST elevation myocardial infarction (STEMI) of inferior wall (HCC) 04/19/2010   a.) transfered [name] --> LHC/PCI (very difficult procedure) --> 3.0 x 23 mm and 3.0 x 12 mm Xience stents to RCA   Allergies    Arthritis    Benign essential hypertension    Bilateral carotid artery disease (HCC) 05/08/2021   a.) carotid doppler 05/08/2021: 1-39% BICA   CAD (coronary artery disease)  04/19/2010   a.) inferior STEMI 04/19/2010 --> LHC/PCI: 50-70% pD1, 80% pRI, 90/90/90% RCA (overlapping 3.0 x 23 and 3.0 x 12 mm Xience DES); b.) MV 11/10/2018: fixed minimally reversible inferior perfusion defect   Cellulitis of foot    Chronic HFrEF (heart failure with reduced ejection fraction) (HCC)    DDD (degenerative disc disease), cervical    Diabetes mellitus type 2, insulin  dependent (HCC)    Diverticulosis    Full dentures    Gout    Hard of hearing    History of bilateral cataract extraction 2022   History of ETOH abuse    Hyperlipidemia    Ischemic cardiomyopathy 04/19/2010   a.) TTE 04/19/2010: 40%; b.) TTE 04/20/2014: EF >55%; c.) TTE 11/10/2018: EF 45%; d.) TTE 11/14/2022: EF 25-30%; e. 06/2023 Echo: EF 25-30%, nl RV size/fxn. Mild MR. Mild-mod TR. Ao sclerosis w/o stenosis.   Long term current use of aspirin     Long term current use of clopidogrel     Lumbar degenerative disc disease    Lumbar radiculopathy    Lumbar vertebral fracture (chronic superior endplate of L1)    NSTEMI (non-ST elevated myocardial infarction) (HCC) 01/15/2023   OSA (obstructive sleep apnea)    a.) unable to tolerate nocturnal PAP therapy   Peripheral artery disease (HCC)    a.) stenting 05/14/21: 12 mm x 12 cm LifeStent RIGHT dis SFA/prox pop; b.) s/p cath directed thrombolysis RIGHT SFA/pop 06/14/21; c.) s/p mech thrombectomy + stenting 06/15/21: 8 mm x 25cm & 8 mm x 7.5cm Viabahn; d.) s/p BILAT CFA, profunda femoris, SFA endarterectomies + fogarty embolectomy + stenting 11/15/22:  12mm x 58mm Lifestream BILAT CIAs, 14 mm x 6 cm Lifestream & 13 mm x 5 cm Viabahn LEFT EIA   Peripheral neuropathy    Umbilical hernia     Past Surgical History:  Procedure Laterality Date   AMPUTATION Right 11/18/2022   Procedure: AMPUTATION 4TH AND 5TH RAY;  Surgeon: [name], [name];  Location: [facility];  Service: Orthopedics/Podiatry;  Laterality: Right;  4th and 5th toe   AMPUTATION Right 04/20/2023   Procedure:  AMPUTATION BELOW KNEE;  Surgeon: Manuel Rater, MD;  Location: [facility];  Service: Vascular;  Laterality: Right;  block then general   APPLICATION OF WOUND VAC Right 01/09/2023   Procedure: APPLICATION OF WOUND VAC;  Surgeon: Manuel College, MD;  Location: [facility];  Service: Vascular;  Laterality: Right;   CATARACT EXTRACTION W/PHACO Right 03/14/2021   Procedure: CATARACT EXTRACTION PHACO AND INTRAOCULAR LENS PLACEMENT (IOC) RIGHT DIABETIC;  Surgeon: Manuel Kidney, MD;  Location: [facility];  Service: Ophthalmology;  Laterality: Right;  Diabetic 16.78 01:39.9   CATARACT EXTRACTION W/PHACO Left 03/28/2021   Procedure: CATARACT EXTRACTION PHACO AND INTRAOCULAR LENS PLACEMENT (IOC) LEFT DIABETIC 6.93 01:22.0;  Surgeon: Manuel Kidney, MD;  Location: [name];  Service: Ophthalmology;  Laterality: Left;  Diabetic   COLONOSCOPY     CORONARY ANGIOPLASTY WITH STENT PLACEMENT  03/2010   Procedure: CORONARY ANGIOPLASTY WITH STENT PLACEMENT; Location: [name]   CORONARY STENT INTERVENTION N/A 07/14/2023   Procedure: CORONARY STENT INTERVENTION;  Surgeon: Manuel Hamilton, MD;  Location: [facility];  Service: Cardiovascular;  Laterality: N/A;   ENDARTERECTOMY FEMORAL Bilateral 11/15/2022   Procedure: BILATERAL COMMON FEMORAL PROFUNDA FEMORIS AND SUPERFICIAL FEMORAL ARTERY ENDARTECTOMIES, RIGHT FOGARTY EMBOLECTOMY OF THE RIGHT SFA  AND  POPLITEAL ARTERIES. AORTAGRAM AND RIGHT LOWER EXTREMITY ANGIOGRAM.;  Surgeon: Manuel College, MD;  Location: [facility];  Service: Vascular;  Laterality: Bilateral;   INSERTION OF ILIAC STENT Bilateral 11/15/2022   Procedure: BILATERAL STENT INSERTION IN BILATERAL  COMMON ILIAC ARTERY, STENT INSERTION OF LEFT EXTERNAL ILIAC ARTERY. ANGIOPLASTY RIGHT TIBIAL  AND POPLITEAL ARTERY.;  Surgeon: Manuel College, MD;  Location: [facility];  Service: Vascular;  Laterality: Bilateral;   IRRIGATION AND DEBRIDEMENT KNEE Right 04/21/2023   Procedure:  IRRIGATION AND DEBRIDEMENT KNEE;  Surgeon: Zafonte, Brian Sharad, MD;  Location: [facility];  Service: Orthopedics;  Laterality: Right;   LOWER EXTREMITY ANGIOGRAPHY Right 05/14/2021   Procedure: LOWER EXTREMITY ANGIOGRAPHY;  Surgeon: Manuel College, MD;  Location: [facility];  Service: Cardiovascular;  Laterality: Right;   LOWER EXTREMITY ANGIOGRAPHY Right 06/14/2021   Procedure: Lower Extremity Angiography;  Surgeon: Manuel College, MD;  Location: [facility];  Service: Cardiovascular;  Laterality: Right;   LOWER EXTREMITY ANGIOGRAPHY Right 11/11/2022   Procedure: Lower Extremity Angiography;  Surgeon: Manuel College, MD;  Location: [facility];  Service: Cardiovascular;  Laterality: Right;   LOWER EXTREMITY INTERVENTION Right 06/15/2021   Procedure: LOWER EXTREMITY INTERVENTION;  Surgeon: Manuel College, MD;  Location: [facility];  Service: Cardiovascular;  Laterality: Right;   RIGHT/LEFT HEART CATH AND CORONARY ANGIOGRAPHY N/A 07/14/2023   Procedure: RIGHT/LEFT HEART CATH AND CORONARY ANGIOGRAPHY;  Surgeon: Manuel Hamilton, MD;  Location: [facility];  Service: Cardiovascular;  Laterality: N/A;   TONSILLECTOMY     TRANSMETATARSAL AMPUTATION Right 02/17/2023   Procedure: TRANSMETATARSAL AMPUTATION;  Surgeon: Pink Bridges, [name];  Location: [facility];  Service: Orthopedics/Podiatry;  Laterality: Right;  WOUND DEBRIDEMENT Right 01/09/2023   Procedure: DEBRIDEMENT WOUND;  Surgeon: Manuel College, MD;  Location: [facility];  Service: Vascular;  Laterality: Right;     Current Outpatient Medications  Medication Sig Dispense Refill   aspirin  EC 81 MG tablet Take 1 tablet (81 mg total) by mouth daily. 150 tablet 2   atorvastatin  (LIPITOR ) 80 MG tablet Take 1 tablet (80 mg total) by mouth at bedtime. 30 tablet 6   carvedilol  (COREG ) 25 MG tablet Take 25 mg by mouth 2 (two) times daily with a meal.     clopidogrel  (PLAVIX ) 75 MG tablet Take 1 tablet (75 mg total) by  mouth daily. 30 tablet 11   dapagliflozin  propanediol (FARXIGA ) 10 MG TABS tablet Take 1 tablet (10 mg total) by mouth daily. 30 tablet 2   doxycycline  (VIBRAMYCIN ) 100 MG capsule Take 1 capsule (100 mg total) by mouth 2 (two) times daily. 20 capsule 0   Ensure Max Protein (ENSURE MAX PROTEIN) LIQD Take 330 mLs (11 oz total) by mouth 2 (two) times daily. (Patient taking differently: Take 11 oz by mouth daily.) 10000 mL 3   ferrous sulfate 325 (65 FE) MG EC tablet Take 325 mg by mouth daily with breakfast.     folic acid  (FOLVITE ) 1 MG tablet Take 1 tablet (1 mg total) by mouth daily. 90 tablet 0   furosemide  (LASIX ) 40 MG tablet Take 1 tablet (40 mg total) by mouth daily. 30 tablet 2   gabapentin  (NEURONTIN ) 100 MG capsule 2 po at bedtime. (Patient taking differently: Take 300 mg by mouth 3 (three) times daily. 2 po at bedtime.) 60 capsule 3   HYDROmorphone  (DILAUDID ) 2 MG tablet Take 2 mg by mouth every 4 (four) hours as needed for severe pain (pain score 7-10).     insulin  aspart protamine - aspart (NOVOLOG  MIX 70/30) (70-30) 100 UNIT/ML injection Inject 30 Units into the skin 2 (two) times daily with a meal.     insulin  glargine (LANTUS  SOLOSTAR) 100 UNIT/ML Solostar Pen Inject 10 Units into the skin daily. 15 mL PRN   methocarbamol  (ROBAXIN ) 500 MG tablet Take 500 mg by mouth every 8 (eight) hours as needed for muscle spasms.     metolazone  (ZAROXOLYN ) 2.5 MG tablet Take 1 tablet (2.5 mg total) by mouth daily for 3 days. Take 30 minutes before your lasix . 3 tablet 3   mirtazapine  (REMERON ) 30 MG tablet Take 1 tablet by mouth at bedtime.     mirtazapine  (REMERON ) 45 MG tablet Take 1 tablet (45 mg total) by mouth at bedtime. 30 tablet 3   nitroGLYCERIN  (NITROSTAT ) 0.4 MG SL tablet Place 1 tablet (0.4 mg total) under the tongue every 5 (five) minutes as needed for chest pain. Max: 3 tablets per day 100 tablet 3   ondansetron  (ZOFRAN ) 4 MG tablet Take 1 tablet (4 mg total) by mouth every 6 (six) hours  as needed for nausea.     polyethylene glycol (MIRALAX  / GLYCOLAX ) 17 g packet Take 17 g by mouth daily as needed for mild constipation. 14 each 0   potassium chloride  SA (KLOR-CON  M) 20 MEQ tablet Take 2 tablets (40 mEq total) by mouth daily. 6 tablet 0   sacubitril -valsartan  (ENTRESTO ) 24-26 MG Take 1 tablet by mouth every 12 (twelve) hours.     senna-docusate (SENOKOT-S) 8.6-50 MG tablet Take 1 tablet by mouth at bedtime as needed for mild constipation. 30 tablet 0   spironolactone  (ALDACTONE ) 25 MG tablet Take 1 tablet (25 mg  total) by mouth daily. 30 tablet 2   thiamine  (VITAMIN B-1) 100 MG tablet Take 1 tablet (100 mg total) by mouth daily. 90 tablet 0   traZODone  (DESYREL ) 150 MG tablet Take 75 mg by mouth at bedtime.     No current facility-administered medications for this visit.    Allergies:   Patient has no known allergies.    Social History:  The patient  reports that he has never smoked. He has never been exposed to tobacco smoke. He has never used smokeless tobacco. He reports current alcohol use of about 21.0 standard drinks of alcohol per week. He reports that he does not use drugs.   Family History:  The patient's ***family history includes Heart disease in his father; Scoliosis in his mother.    ROS:  Please see the history of present illness.   Otherwise, review of systems are positive for {NONE DEFAULTED:18576}.   All other systems are reviewed and negative.    PHYSICAL EXAM: VS:  There were no vitals taken for this visit. , BMI There is no height or weight on file to calculate BMI. GEN: Well nourished, well developed, in no acute distress  HEENT: normal  Neck: no JVD, carotid bruits, or masses Cardiac: ***RRR; no murmurs, rubs, or gallops,no edema  Respiratory:  clear to auscultation bilaterally, normal work of breathing GI: soft, nontender, nondistended, + BS MS: no deformity or atrophy  Skin: warm and dry, no rash Neuro:  Strength and sensation are  intact Psych: euthymic mood, full affect   EKG:  EKG {ACTION; IS/IS [id number]} ordered today. The ekg ordered today demonstrates ***   Recent Labs: 04/19/2023: TSH 1.416 07/09/2023: ALT 14; B Natriuretic Peptide 2,395.3 07/16/2023: Hemoglobin 10.0; Magnesium  2.0; Platelets 222 07/21/2023: BUN 30; Creatinine, Ser 1.08; NT-Pro BNP 9,804; Potassium 4.4; Sodium 137    Lipid Panel    Component Value Date/Time   CHOL 59 07/13/2023 0518   TRIG 55 07/13/2023 0518   HDL 32 (L) 07/13/2023 0518   CHOLHDL 1.8 07/13/2023 0518   VLDL 11 07/13/2023 0518   LDLCALC 16 07/13/2023 0518      Wt Readings from Last 3 Encounters:  07/28/23 184 lb (83.5 kg)  07/21/23 195 lb 6.4 oz (88.6 kg)  07/16/23 189 lb 9.5 oz (86 kg)      Other studies Reviewed: Additional studies/ records that were reviewed today include: ***. Review of the above records demonstrates: ***      No data to display            ASSESSMENT AND PLAN:  1.  Coronary artery disease involving native coronary arteries  2.  Chronic systolic heart failure with an EF of 25 to 30%  3.  Essential hypertension  4.  Peripheral arterial disease  5.  Hyperlipidemia    Disposition:   FU with *** in {gen number [id number]:191478} {Days to years:10300}  Signed,  Antionette Kirks, MD  09/04/2023 4:06 PM    [facility]

## 2023-09-05 ENCOUNTER — Ambulatory Visit: Admitting: Cardiovascular Disease

## 2023-09-05 ENCOUNTER — Encounter (INDEPENDENT_AMBULATORY_CARE_PROVIDER_SITE_OTHER): Payer: Self-pay | Admitting: Nurse Practitioner

## 2023-09-05 NOTE — Progress Notes (Signed)
 Subjective:    Patient ID: Manuel [name], male    DOB: 1945/05/02, 78 y.o.   MRN: [id number] Chief Complaint  Patient presents with   Follow-up    3 month abi follow up    The patient returns to the office for followup regarding atherosclerotic changes of the lower extremities and review of the noninvasive studies.  The patient has been fitted for his prosthetic and will hopefully have it soon to begin training with.  There have been no interval changes in lower extremity symptoms. No interval shortening of the patient's claudication distance or development of rest pain symptoms. No new ulcers or wounds have occurred since the last visit.  There have been no significant changes to the patient's overall health care.  The patient denies amaurosis fugax or recent TIA symptoms. There are no documented recent neurological changes noted. There is no history of DVT, PE or superficial thrombophlebitis. The patient denies recent episodes of angina or shortness of breath.   ABI Rt=bka and Lt=1.01  (previous ABI's Rt=bka and Lt=0.82) Duplex ultrasound of the tibial vessels shows multiphasic waveforms with good toe waveforms    Review of Systems  Musculoskeletal:  Positive for gait problem.  Neurological:  Positive for weakness.  All other systems reviewed and are negative.      Objective:    Physical Exam Vitals reviewed.  HENT:     Head: Normocephalic.  Cardiovascular:     Rate and Rhythm: Normal rate.     Pulses:          Dorsalis pedis pulses are detected w/ Doppler on the right side and detected w/ Doppler on the left side.       Posterior tibial pulses are detected w/ Doppler on the right side and detected w/ Doppler on the left side.  Pulmonary:     Effort: Pulmonary effort is normal.  Musculoskeletal:     Right Lower Extremity: Right leg is amputated below knee.  Skin:    General: Skin is warm and dry.  Neurological:     Mental Status: He is alert and oriented to  person, place, and time.  Psychiatric:        Mood and Affect: Mood normal.        Behavior: Behavior normal.        Thought Content: Thought content normal.        Judgment: Judgment normal.     BP (!) 143/79   Pulse 71   Resp 16   Past Medical History:  Diagnosis Date   Acute ST elevation myocardial infarction (STEMI) of inferior wall (HCC) 04/19/2010   a.) transfered from [facility] to [facility] --> LHC/PCI (very difficult procedure) --> 3.0 x 23 mm and 3.0 x 12 mm Xience stents to RCA   Allergies    Arthritis    Benign essential hypertension    Bilateral carotid artery disease (HCC) 05/08/2021   a.) carotid doppler 05/08/2021: 1-39% BICA   CAD (coronary artery disease) 04/19/2010   a.) inferior STEMI 04/19/2010 --> LHC/PCI: 50-70% pD1, 80% pRI, 90/90/90% RCA (overlapping 3.0 x 23 and 3.0 x 12 mm Xience DES); b.) MV 11/10/2018: fixed minimally reversible inferior perfusion defect   Cellulitis of foot    Chronic HFrEF (heart failure with reduced ejection fraction) (HCC)    DDD (degenerative disc disease), cervical    Diabetes mellitus type 2, insulin  dependent (HCC)    Diverticulosis    Full dentures    Gout    Hard  of hearing    History of bilateral cataract extraction 2022   History of ETOH abuse    Hyperlipidemia    Ischemic cardiomyopathy 04/19/2010   a.) TTE 04/19/2010: 40%; b.) TTE 04/20/2014: EF >55%; c.) TTE 11/10/2018: EF 45%; d.) TTE 11/14/2022: EF 25-30%; e. 06/2023 Echo: EF 25-30%, nl RV size/fxn. Mild MR. Mild-mod TR. Ao sclerosis w/o stenosis.   Long term current use of aspirin     Long term current use of clopidogrel     Lumbar degenerative disc disease    Lumbar radiculopathy    Lumbar vertebral fracture (chronic superior endplate of L1)    NSTEMI (non-ST elevated myocardial infarction) (HCC) 01/15/2023   OSA (obstructive sleep apnea)    a.) unable to tolerate nocturnal PAP therapy   Peripheral artery disease (HCC)    a.) stenting 05/14/21: 12 mm x 12 cm LifeStent  RIGHT dis SFA/prox pop; b.) s/p cath directed thrombolysis RIGHT SFA/pop 06/14/21; c.) s/p mech thrombectomy + stenting 06/15/21: 8 mm x 25cm & 8 mm x 7.5cm Viabahn; d.) s/p BILAT CFA, profunda femoris, SFA endarterectomies + fogarty embolectomy + stenting 11/15/22: 12mm x 58mm Lifestream BILAT CIAs, 14 mm x 6 cm Lifestream & 13 mm x 5 cm Viabahn LEFT EIA   Peripheral neuropathy    Umbilical hernia     Social History   Socioeconomic History   Marital status: Married    Spouse name: [name]   Number of children: 3   Years of education: Not on file   Highest education level: Not on file  Occupational History   Not on file  Tobacco Use   Smoking status: Never    Passive exposure: Never   Smokeless tobacco: Never  Vaping Use   Vaping status: Never Used  Substance and Sexual Activity   Alcohol use: Yes    Alcohol/week: 21.0 standard drinks of alcohol    Types: 7 Glasses of wine, 14 Cans of beer per week    Comment: occassional   Drug use: No   Sexual activity: Not on file  Other Topics Concern   Not on file  Social History Narrative   Not on file   Social Drivers of Health   Financial Resource Strain: Low Risk  (07/11/2023)   Overall Financial Resource Strain (CARDIA)    Difficulty of Paying Living Expenses: Not hard at all  Food Insecurity: No Food Insecurity (07/10/2023)   Hunger Vital Sign    Worried About Running Out of Food in the Last Year: Never true    Ran Out of Food in the Last Year: Never true  Transportation Needs: No Transportation Needs (07/11/2023)   PRAPARE - Administrator, Civil Service (Medical): No    Lack of Transportation (Non-Medical): No  Physical Activity: Not on file  Stress: Not on file  Social Connections: Unknown (07/10/2023)   Social Connection and Isolation Panel [NHANES]    Frequency of Communication with Friends and Family: More than three times a week    Frequency of Social Gatherings with Friends and Family: More than three times a  week    Attends Religious Services: Patient declined    Database administrator or Organizations: No    Attends Banker Meetings: Patient declined    Marital Status: Married  Catering manager Violence: Not At Risk (07/10/2023)   Humiliation, Afraid, Rape, and Kick questionnaire    Fear of Current or Ex-Partner: No    Emotionally Abused: No    Physically Abused: No  Sexually Abused: No    Past Surgical History:  Procedure Laterality Date   AMPUTATION Right 11/18/2022   Procedure: AMPUTATION 4TH AND 5TH RAY;  Surgeon: Angel Barba, [name];  Location: [facility];  Service: Orthopedics/Podiatry;  Laterality: Right;  4th and 5th toe   AMPUTATION Right 04/20/2023   Procedure: AMPUTATION BELOW KNEE;  Surgeon: Lesta Rater, MD;  Location: [facility];  Service: Vascular;  Laterality: Right;  block then general   APPLICATION OF WOUND VAC Right 01/09/2023   Procedure: APPLICATION OF WOUND VAC;  Surgeon: Celso College, MD;  Location: [facility];  Service: Vascular;  Laterality: Right;   CATARACT EXTRACTION W/PHACO Right 03/14/2021   Procedure: CATARACT EXTRACTION PHACO AND INTRAOCULAR LENS PLACEMENT (IOC) RIGHT DIABETIC;  Surgeon: Annell Kidney, MD;  Location: [facility];  Service: Ophthalmology;  Laterality: Right;  Diabetic 16.78 01:39.9   CATARACT EXTRACTION W/PHACO Left 03/28/2021   Procedure: CATARACT EXTRACTION PHACO AND INTRAOCULAR LENS PLACEMENT (IOC) LEFT DIABETIC 6.93 01:22.0;  Surgeon: Annell Kidney, MD;  Location: [facility];  Service: Ophthalmology;  Laterality: Left;  Diabetic   COLONOSCOPY     CORONARY ANGIOPLASTY WITH STENT PLACEMENT  03/2010   Procedure: CORONARY ANGIOPLASTY WITH STENT PLACEMENT; Location: [name]   CORONARY STENT INTERVENTION N/A 07/14/2023   Procedure: CORONARY STENT INTERVENTION;  Surgeon: Wenona Hamilton, MD;  Location: [facility];  Service: Cardiovascular;  Laterality: N/A;   ENDARTERECTOMY FEMORAL Bilateral  11/15/2022   Procedure: BILATERAL COMMON FEMORAL PROFUNDA FEMORIS AND SUPERFICIAL FEMORAL ARTERY ENDARTECTOMIES, RIGHT FOGARTY EMBOLECTOMY OF THE RIGHT SFA  AND  POPLITEAL ARTERIES. AORTAGRAM AND RIGHT LOWER EXTREMITY ANGIOGRAM.;  Surgeon: Celso College, MD;  Location: [facility];  Service: Vascular;  Laterality: Bilateral;   INSERTION OF ILIAC STENT Bilateral 11/15/2022   Procedure: BILATERAL STENT INSERTION IN BILATERAL  COMMON ILIAC ARTERY, STENT INSERTION OF LEFT EXTERNAL ILIAC ARTERY. ANGIOPLASTY RIGHT TIBIAL  AND POPLITEAL ARTERY.;  Surgeon: Celso College, MD;  Location: [facility];  Service: Vascular;  Laterality: Bilateral;   IRRIGATION AND DEBRIDEMENT KNEE Right 04/21/2023   Procedure: IRRIGATION AND DEBRIDEMENT KNEE;  Surgeon: Zafonte, Brian Trystin, MD;  Location: [facility];  Service: Orthopedics;  Laterality: Right;   LOWER EXTREMITY ANGIOGRAPHY Right 05/14/2021   Procedure: LOWER EXTREMITY ANGIOGRAPHY;  Surgeon: Celso College, MD;  Location: [facility];  Service: Cardiovascular;  Laterality: Right;   LOWER EXTREMITY ANGIOGRAPHY Right 06/14/2021   Procedure: Lower Extremity Angiography;  Surgeon: Celso College, MD;  Location: [facility];  Service: Cardiovascular;  Laterality: Right;   LOWER EXTREMITY ANGIOGRAPHY Right 11/11/2022   Procedure: Lower Extremity Angiography;  Surgeon: Celso College, MD;  Location: [facility];  Service: Cardiovascular;  Laterality: Right;   LOWER EXTREMITY INTERVENTION Right 06/15/2021   Procedure: LOWER EXTREMITY INTERVENTION;  Surgeon: Celso College, MD;  Location: [facility];  Service: Cardiovascular;  Laterality: Right;   RIGHT/LEFT HEART CATH AND CORONARY ANGIOGRAPHY N/A 07/14/2023   Procedure: RIGHT/LEFT HEART CATH AND CORONARY ANGIOGRAPHY;  Surgeon: Wenona Hamilton, MD;  Location: [facility];  Service: Cardiovascular;  Laterality: N/A;   TONSILLECTOMY     TRANSMETATARSAL AMPUTATION Right 02/17/2023   Procedure:  TRANSMETATARSAL AMPUTATION;  Surgeon: Pink Bridges, [name];  Location: [facility];  Service: Orthopedics/Podiatry;  Laterality: Right;   WOUND DEBRIDEMENT Right 01/09/2023   Procedure: DEBRIDEMENT WOUND;  Surgeon: Celso College, MD;  Location: [facility];  Service: Vascular;  Laterality: Right;  Family History  Problem Relation Age of Onset   Scoliosis Mother    Heart disease Father     No Known Allergies     Latest Ref Rng & Units 07/16/2023    4:20 AM 07/15/2023    3:02 AM 07/14/2023   11:25 AM  CBC  WBC 4.0 - 10.5 K/uL 5.4  5.6    Hemoglobin 13.0 - 17.0 g/dL 16.1  9.8  09.6   Hematocrit 39.0 - 52.0 % 30.7  30.1  33.0   Platelets 150 - 400 K/uL 222  222         CMP     Component Value Date/Time   NA 137 07/21/2023 1246   NA 137 02/16/2012 2013   K 4.4 07/21/2023 1246   K 3.8 02/16/2012 2013   CL 98 07/21/2023 1246   CL 102 02/16/2012 2013   CO2 25 07/21/2023 1246   CO2 25 02/16/2012 2013   GLUCOSE 140 (H) 07/21/2023 1246   GLUCOSE 122 (H) 07/16/2023 0420   GLUCOSE 154 (H) 02/16/2012 2013   BUN 30 (H) 07/21/2023 1246   BUN 10 02/16/2012 2013   CREATININE 1.08 07/21/2023 1246   CREATININE 0.72 02/16/2012 2013   CALCIUM  9.4 07/21/2023 1246   CALCIUM  9.6 02/16/2012 2013   PROT 7.2 07/09/2023 1152   PROT 7.5 02/16/2012 2013   ALBUMIN 3.5 07/09/2023 1152   ALBUMIN 4.4 02/16/2012 2013   AST 21 07/09/2023 1152   AST 52 (H) 02/16/2012 2013   ALT 14 07/09/2023 1152   ALT 101 (H) 02/16/2012 2013   ALKPHOS 83 07/09/2023 1152   ALKPHOS 77 02/16/2012 2013   BILITOT 0.9 07/09/2023 1152   BILITOT 0.6 02/16/2012 2013   EGFR 70 07/21/2023 1246   GFRNONAA >60 07/16/2023 0420   GFRNONAA >60 02/16/2012 2013     VAS US  ABI WITH/WO TBI Result Date: 09/03/2023  LOWER EXTREMITY DOPPLER STUDY Patient Name:  DELRAY REZA  Date of Exam:   09/02/2023 Medical Rec #: [id number]         Accession #:    [id number] Date of Birth: [birth year]         Patient Gender: M Patient Age:   33 years  Exam Location:  [facility] Procedure:      VAS US  ABI WITH/WO TBI Referring Phys: --------------------------------------------------------------------------------  Indications: Peripheral artery disease. High Risk Factors: Hypertension, hyperlipidemia. 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  Vascular Interventions: 11/18/2022 right 4th and 5th toe amputation                          11/15/2022 Bilateral CIA, left EIA stents PTA of right                         Pop and PTA                          05/14/2021: Aortogram and Selctive Right Lower Extremity                         Angiogram. PTA Left CIA. PTA Right Peroneal Artery                         Tibioperoneal Trunk. PTA of the Right Distal SFA. Stent  placement to the Right SFA Distal segment and Proximal                         Popliteal Artery. Comparison Study: 12/2022 Performing Technologist: Faustine Hoof RVT  Examination Guidelines: A complete evaluation includes at minimum, Doppler waveform signals and systolic blood pressure reading at the level of bilateral brachial, anterior tibial, and posterior tibial arteries, when vessel segments are accessible. Bilateral testing is considered an integral part of a complete examination. Photoelectric Plethysmograph (PPG) waveforms and toe systolic pressure readings are included as required and additional duplex testing as needed. Limited examinations for reoccurring indications may be performed as noted.  ABI Findings: +--------+------------------+-----+--------+--------+ Right   Rt Pressure (mmHg)IndexWaveformComment  +--------+------------------+-----+--------+--------+ [id number]                                     +--------+------------------+-----+--------+--------+ +---------+------------------+-----+----------+-------+ Left     Lt Pressure (mmHg)IndexWaveform  Comment +---------+------------------+-----+----------+-------+ PTA      138                1.01 triphasic         +---------+------------------+-----+----------+-------+ DP       126               0.93 monophasic        +---------+------------------+-----+----------+-------+ Great [id number]               0.90 Normal            +---------+------------------+-----+----------+-------+ +-------+-----------+-----------+------------+------------+ ABI/TBIToday's ABIToday's TBIPrevious ABIPrevious TBI +-------+-----------+-----------+------------+------------+ Right  BKA                                            +-------+-----------+-----------+------------+------------+ Left   1.01       .90        .82         .54          +-------+-----------+-----------+------------+------------+ Left ABIs and TBIs appear increased compared to prior study on 12/2022.  *See table(s) above for measurements and observations.  Electronically signed by Mikki Alexander MD on 09/03/2023 at 9:33:40 AM.    Final        Assessment & Plan:   1. PAD (peripheral artery disease) (HCC) (Primary)  Recommend:  The patient has evidence of atherosclerosis of the lower extremities with  no claudication.  The patient does not voice lifestyle limiting changes at this point in time.  Noninvasive studies do not suggest clinically significant change.  No invasive studies, angiography or surgery at this time The patient should continue walking and begin a more formal exercise program.  The patient should continue antiplatelet therapy and aggressive treatment of the lipid abnormalities  No changes in the patient's medications at this time  Continued surveillance is indicated as atherosclerosis is likely to progress with time.    The patient will continue follow up with noninvasive studies as ordered.   2. Essential hypertension Continue antihypertensive medications as already ordered, these medications have been reviewed and there are no changes at this time.  3. Diabetes mellitus type 2, insulin   dependent (HCC) Continue hypoglycemic medications as already ordered, these medications have been reviewed and there are no changes at this time.  Hgb A1C to be monitored as already arranged by primary service  Current Outpatient Medications on File Prior to Visit  Medication Sig Dispense Refill   aspirin  EC 81 MG tablet Take 1 tablet (81 mg total) by mouth daily. 150 tablet 2   atorvastatin  (LIPITOR ) 80 MG tablet Take 1 tablet (80 mg total) by mouth at bedtime. 30 tablet 6   carvedilol  (COREG ) 25 MG tablet Take 25 mg by mouth 2 (two) times daily with a meal.     clopidogrel  (PLAVIX ) 75 MG tablet Take 1 tablet (75 mg total) by mouth daily. 30 tablet 11   dapagliflozin  propanediol (FARXIGA ) 10 MG TABS tablet Take 1 tablet (10 mg total) by mouth daily. 30 tablet 2   Ensure Max Protein (ENSURE MAX PROTEIN) LIQD Take 330 mLs (11 oz total) by mouth 2 (two) times daily. (Patient taking differently: Take 11 oz by mouth daily.) 10000 mL 3   ferrous sulfate 325 (65 FE) MG EC tablet Take 325 mg by mouth daily with breakfast.     folic acid  (FOLVITE ) 1 MG tablet Take 1 tablet (1 mg total) by mouth daily. 90 tablet 0   furosemide  (LASIX ) 40 MG tablet Take 1 tablet (40 mg total) by mouth daily. 30 tablet 2   gabapentin  (NEURONTIN ) 100 MG capsule 2 po at bedtime. (Patient taking differently: Take 300 mg by mouth 3 (three) times daily. 2 po at bedtime.) 60 capsule 3   HYDROmorphone  (DILAUDID ) 2 MG tablet Take 2 mg by mouth every 4 (four) hours as needed for severe pain (pain score 7-10).     insulin  aspart protamine - aspart (NOVOLOG  MIX 70/30) (70-30) 100 UNIT/ML injection Inject 30 Units into the skin 2 (two) times daily with a meal.     insulin  glargine (LANTUS  SOLOSTAR) 100 UNIT/ML Solostar Pen Inject 10 Units into the skin daily. 15 mL PRN   methocarbamol  (ROBAXIN ) 500 MG tablet Take 500 mg by mouth every 8 (eight) hours as needed for muscle spasms.     mirtazapine  (REMERON ) 45 MG tablet Take 1 tablet  (45 mg total) by mouth at bedtime. 30 tablet 3   nitroGLYCERIN  (NITROSTAT ) 0.4 MG SL tablet Place 1 tablet (0.4 mg total) under the tongue every 5 (five) minutes as needed for chest pain. Max: 3 tablets per day 100 tablet 3   ondansetron  (ZOFRAN ) 4 MG tablet Take 1 tablet (4 mg total) by mouth every 6 (six) hours as needed for nausea.     polyethylene glycol (MIRALAX  / GLYCOLAX ) 17 g packet Take 17 g by mouth daily as needed for mild constipation. 14 each 0   sacubitril -valsartan  (ENTRESTO ) 24-26 MG Take 1 tablet by mouth every 12 (twelve) hours.     senna-docusate (SENOKOT-S) 8.6-50 MG tablet Take 1 tablet by mouth at bedtime as needed for mild constipation. 30 tablet 0   spironolactone  (ALDACTONE ) 25 MG tablet Take 1 tablet (25 mg total) by mouth daily. 30 tablet 2   thiamine  (VITAMIN B-1) 100 MG tablet Take 1 tablet (100 mg total) by mouth daily. 90 tablet 0   traZODone  (DESYREL ) 150 MG tablet Take 75 mg by mouth at bedtime.     metolazone  (ZAROXOLYN ) 2.5 MG tablet Take 1 tablet (2.5 mg total) by mouth daily for 3 days. Take 30 minutes before your lasix . 3 tablet 3   mirtazapine  (REMERON ) 30 MG tablet Take 1 tablet by mouth at bedtime.     potassium chloride  SA (KLOR-CON  M) 20 MEQ tablet Take 2 tablets (40 mEq total) by mouth daily. 6 tablet 0   No current  facility-administered medications on file prior to visit.    There are no Patient Instructions on file for this visit. No follow-ups on file.   Talaya Lamprecht E Nicolas Sisler, NP

## 2023-09-09 ENCOUNTER — Encounter (INDEPENDENT_AMBULATORY_CARE_PROVIDER_SITE_OTHER): Payer: Self-pay

## 2023-09-10 ENCOUNTER — Ambulatory Visit: Admitting: Family Medicine

## 2023-09-10 ENCOUNTER — Telehealth: Payer: Self-pay | Admitting: Family Medicine

## 2023-09-10 NOTE — Telephone Encounter (Signed)
 Called ans LM that I noticed that he missed his appointment today and I was checking on him.

## 2023-09-11 ENCOUNTER — Ambulatory Visit (INDEPENDENT_AMBULATORY_CARE_PROVIDER_SITE_OTHER): Admitting: Family Medicine

## 2023-09-11 ENCOUNTER — Encounter: Payer: Self-pay | Admitting: Family Medicine

## 2023-09-11 VITALS — BP 166/102 | HR 78 | Temp 97.6°F | Resp 18 | Ht 67.99 in | Wt 191.0 lb

## 2023-09-11 DIAGNOSIS — F5101 Primary insomnia: Secondary | ICD-10-CM

## 2023-09-11 DIAGNOSIS — F321 Major depressive disorder, single episode, moderate: Secondary | ICD-10-CM

## 2023-09-11 DIAGNOSIS — F1010 Alcohol abuse, uncomplicated: Secondary | ICD-10-CM | POA: Diagnosis not present

## 2023-09-11 DIAGNOSIS — I10 Essential (primary) hypertension: Secondary | ICD-10-CM | POA: Diagnosis not present

## 2023-09-11 DIAGNOSIS — Z89511 Acquired absence of right leg below knee: Secondary | ICD-10-CM

## 2023-09-11 NOTE — Progress Notes (Unsigned)
 Established Patient Office Visit  Subjective   Patient ID: Manuel [name], male    DOB: January 07, 1946  Age: 78 y.o. MRN: [id number]  Chief Complaint  Patient presents with   Insomnia    Insomnia  Delightful 78 year old gentleman with CAD (s/p PCI to RCA with overlapping stents, NSTEMI 3/25, peak troponin 1045, 3 DES to LAD), ischemic cardiomyopathy (724 LVEF 25 to 35% 3/25 echo LVEF 25 to 35% moderate dilatation LV), PAD (2011 s/p PTA L common iliac, right peritoneal, left distal SFA, proximal popliteal, stent to right distal SFA), s/p right BKA, gout, DMT2, mixed hyperlipidemia, HTN, depression/anxiety, insomnia, lumbar DDD and uncontrolled painful diabetic peripheral neuropathy.      He is insomnia.  He estimates that he has not had a decent night sleep in greater than a year.  He lays awake from 1:00 in the morning to 6 AM.  He finally gets so exhausted he will drop off to sleep and sleep maybe 2 hours.  Then he will get up and take a shower and then go back to bed and sleep for a few hours.  Does not have peripheral neuropathy or phantom pain much during the day but has it pretty severely at night.  When he has hydromorphone , he takes 2 to 4 mg which helps him with both pain and sleep.  Gabapentin  100 mg stops the peripheral neuropathy but does not help him sleep.  Gabapentin  200 mg stops the peripheral neuropathy and helps him sleep but then he feels hung over then groggy the next day.  If he takes gabapentin  300 mg then he cannot get up at all the next day because he is so groggy.  He typically drinks 1-2 alcoholic beverages every day in the evening.  He is taking mirtazapine  45 mg nightly and does not think this has affected his ability to sleep.  He has tried melatonin and that does not help him sleep.  He frequently takes aspirin  325 mg 2 at night to help with his phantom pain and peripheral neuropathy.  He does not take acetaminophen  because he drinks alcohol.  He listens to the radio at  night. He is depressed but denies suicidal ideation.  He states that he is very unhappy with how he feels.  One-half of his friends have already died.  On the other hand he has been married 41 years and he has a good marriage and lives with his wife.  But he is beginning to have debilitating losses.  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  He has systolic heart failure and he is hearing impaired. His blood pressure is elevated today.  He admits that he did not take his blood pressure medication today.     History (Optional):23778}  Review of Systems  Psychiatric/Behavioral:  The patient has insomnia.       Objective:     BP (!) 166/102 (BP Location: Left Arm, Patient Position: Sitting, Cuff Size: Normal)   Pulse 78   Temp 97.6 F (36.4 C) (Oral)   Resp 18   Ht 5' 7.99" (1.727 m)   Wt 191 lb (86.6 kg)   SpO2 93%   BMI 29.05 kg/m    Physical Exam Vitals and nursing note reviewed.  Constitutional:      Appearance: Normal appearance.     Comments: He is seated in a wheelchair and has a compression sock on his right BKA stump   HENT:     Head: Normocephalic and atraumatic.  Eyes:     Conjunctiva/sclera: Conjunctivae normal.  Musculoskeletal:     Left lower leg: No edema.  Neurological:     Mental Status: He is alert and oriented to person, place, and time.  Psychiatric:        Mood and Affect: Mood normal.        Behavior: Behavior normal.        Thought Content: Thought content normal.        Judgment: Judgment normal.          No results found for any visits on 09/11/23.    The ASCVD Risk score (Arnett DK, et al., 2019) failed to calculate for the following reasons:   The 2019 ASCVD risk score is only valid for ages 84 to 49   Risk score cannot be calculated because patient has a medical history suggesting prior/existing ASCVD    Assessment & Plan:  Essential hypertension Assessment & Plan: He is noncompliant with his blood pressure medication.  Wife  confirms he has not taken any medication in several days.  Encouraged him to take his medicine correctly and daily.   Primary insomnia Assessment & Plan: Ask him to stop drinking alcohol because it may be interfering with his ability to sleep.  Asked him to stop listening to the radio and work on sleep hygiene, also asked him to get on a sleep routine of going to bed at the same time each day and getting up at the same time each day.    Orders: -     Mirtazapine ; Take 1 po at bedtime for 7 days then take 1/2 po at bedtime for 7 days.  Then take 1/4 po q day for 7 days  Dispense: 15 tablet; Refill: 0  Depression, major, single episode, moderate (HCC) Assessment & Plan: Mirtazapine  did not help his depression.  He is willing to try Lexapro 5 mg a day.  Will have him taper off mirtazapine : 30 mg nightly for a week followed by 15 mg nightly for a week followed by 7-1/2 mg nightly for a week.  May start Lexapro when starting the 15 mg of mirtazapine . Hold the gabapentin  for a few days and lets see what Lexapro does in terms of helping him sleep.  Orders: -     Folic Acid ; Take 1 tablet (1 mg total) by mouth daily.  Dispense: 90 tablet; Refill: 1 -     Mirtazapine ; Take 1 po at bedtime for 7 days then take 1/2 po at bedtime for 7 days.  Then take 1/4 po q day for 7 days  Dispense: 15 tablet; Refill: 0 -     Escitalopram Oxalate; Take 1 po at bedtime when you start the mirtazapine  15mg  dose.  Dispense: 30 tablet; Refill: 3  ETOH abuse Assessment & Plan: Confirm that his wife that he drinks 1-2 beers a day.  Was recently hospitalized and had no difficulty with the abrupt cessation of alcohol.  Encouraged him to hold alcohol because it is likely contributing to his insomnia.  Orders: -     Folic Acid ; Take 1 tablet (1 mg total) by mouth daily.  Dispense: 90 tablet; Refill: 1     Return in about 4 weeks (around 10/09/2023).    Simone Rodenbeck K Precious Gilchrest, MD

## 2023-09-11 NOTE — Assessment & Plan Note (Signed)
 He is willing to try Lexapro 5 mg a day.  Asked him to stop the mirtazapine  and trazadone. Hold the gabapentin  for a few days.

## 2023-09-11 NOTE — Assessment & Plan Note (Signed)
 He is noncompliant with his blood pressure medication.  Wife confirms he has not taken any medication in several days.  Encouraged him to take his medicine correctly and daily.

## 2023-09-11 NOTE — Assessment & Plan Note (Signed)
 Ask him to stop drinking alcohol because it may be interfering with his ability to sleep.  Asked him to stop listening to the radio and work on sleep hygiene, also asked him to get on a sleep routine of going to bed at the same time each day and getting up at the same time each day.

## 2023-09-12 ENCOUNTER — Telehealth: Payer: Self-pay | Admitting: Family Medicine

## 2023-09-12 MED ORDER — MIRTAZAPINE 30 MG PO TABS: ORAL_TABLET | ORAL | 0 refills | Status: DC

## 2023-09-12 MED ORDER — FOLIC ACID 1 MG PO TABS: 1.0000 mg | ORAL_TABLET | Freq: Every day | ORAL | 1 refills | Status: AC

## 2023-09-12 MED ORDER — ESCITALOPRAM OXALATE 5 MG PO TABS: ORAL_TABLET | ORAL | 3 refills | Status: DC

## 2023-09-12 NOTE — Telephone Encounter (Signed)
 Spoke with Rendell Carrel, spouse.  Advised will need a mirtazapine  taper before starting the Lexapro.

## 2023-09-14 NOTE — Assessment & Plan Note (Signed)
 He is wearing compression sleeve on his right BKA stump.  Needs physical therapy to adapt to his prosthetic.

## 2023-09-14 NOTE — Assessment & Plan Note (Signed)
 Confirm that his wife that he drinks 1-2 beers a day.  Was recently hospitalized and had no difficulty with the abrupt cessation of alcohol.  Encouraged him to hold alcohol because it is likely contributing to his insomnia.

## 2023-09-17 ENCOUNTER — Telehealth: Payer: Self-pay | Admitting: Family

## 2023-09-17 NOTE — Telephone Encounter (Signed)
 Called to confirm/remind patient of their appointment at [facility] on 09/18/23.   Appointment:   [x] Confirmed  [] Left mess   [] No answer/No voice mail  [] VM Full/unable to leave message  [] Phone not in service  Patient reminded to bring all medications and/or complete list.  Confirmed patient has transportation. Gave directions, instructed to utilize valet parking.

## 2023-09-17 NOTE — Progress Notes (Deleted)
 [facility] Note   Referring Physician: recent admission PCP: [name], Lacinda Pica, MD (last seen 03/25) Cardiologist: Dorita Garter, MD (to be seen 04/25)  Chief Complaint: shortness of breath  HPI:  Manuel [name] is a 78 y/o male with a history of CAD, PAD s/p right BKA, gout, DM, hyperlipidemia, HTN, depression, anxiety, insomnia and chronic heart failure. CAD s/p PCI to [facility] with overlapping stents in 2011. extensive PAD s/p PTA L common Iliac, R peroneal, R distal SFA/prox popliteal, and stent to R distal SFA/prox popliteal arteries (05/14/21). Echo in 10/2022 with LVEF of 25-30%.   Admitted 07/09/23 due to shortness of breath, decreased urine output, and inability to sleep for the last year. Found to have NSTEMI with peak troponin of 1045. Echo 06/2023 with LVEF 25-30%, moderately dilated LV, and mild valvular disease. LHC 06/2023 where 3 DES were placed to the LAD. Chest x-ray noted mild diffuse vascular congestion, bilateral trace pleural effusions, concerning for CHF.   He presents today, with his girlfriend, for his initial HF visit with a chief complaint of minimal shortness of breath with exertion. Has associated fatigue, chronic difficulty sleeping and slight swelling in left lower leg along with this. Has been in a wheelchair for the last 3 months due to right BKA. Denies chest pain, palpitations, abdominal distention or dizziness. He says that he's taking trazodone  for sleep but says that it doesn't help. Tried melatonin in the past which also didn't work.   Drinks a 12 ounce can of beer daily. No liquor in the last 4-5 months, denies tobacco/ drug use.   Would like a referral to [facility] cardiology as that is who did his cath during the hospital and they plan to continue with [facility]. To have upcoming sleep study done.   Review of Systems: [y] = yes, [ ]  = no   General: Weight gain [ ] ; Weight loss [ ] ; Anorexia [ ] ; Fatigue Davis.Dad ]; Fever [ ] ; Chills [ ] ; Weakness [ ]    Cardiac: Chest pain/pressure [ ] ; Resting SOB [ ] ; Exertional SOB Davis.Dad ]; Orthopnea [ ] ; Pedal Edema Davis.Dad ]; Palpitations [ ] ; Syncope [ ] ; Presyncope [ ] ; Paroxysmal nocturnal dyspnea[ ]   Pulmonary: Cough [ ] ; Wheezing[ ] ; Hemoptysis[ ] ; Sputum [ ] ; Snoring [ ]   GI: Vomiting[ ] ; Dysphagia[ ] ; Melena[ ] ; Hematochezia [ ] ; Heartburn[ ] ; Abdominal pain [ ] ; Constipation [ ] ; Diarrhea [ ] ; BRBPR [ ]   GU: Hematuria[ ] ; Dysuria [ ] ; Nocturia[ ]   Vascular: Pain in legs with walking [ ] ; Pain in feet with lying flat [ ] ; Non-healing sores [ ] ; Stroke [ ] ; TIA [ ] ; Slurred speech [ ] ;  Neuro: Headaches[ ] ; Vertigo[ ] ; Seizures[ ] ; Paresthesias[ ] ;Blurred vision [ ] ; Diplopia [ ] ; Vision changes [ ]   Ortho/Skin: Arthritis [ ] ; Joint pain [ ] ; Muscle pain [ ] ; Joint swelling [ ] ; Back Pain [ ] ; Rash [ ]   Psych: Depression[ ] ; Anxiety[ ]   Heme: Bleeding problems [ ] ; Clotting disorders [ ] ; Anemia [ ]   Endocrine: Diabetes Davis.Dad ]; Thyroid dysfunction[ ]    Past Medical History:  Diagnosis Date   Acute ST elevation myocardial infarction (STEMI) of inferior wall (HCC) 04/19/2010   a.) transfered [facility] to [facility] --> LHC/PCI (very difficult procedure) --> 3.0 x 23 mm and 3.0 x 12 mm Xience stents to RCA   Allergies    Arthritis    Benign essential hypertension    Bilateral carotid artery disease (HCC) 05/08/2021  a.) carotid doppler 05/08/2021: 1-39% BICA   CAD (coronary artery disease) 04/19/2010   a.) inferior STEMI 04/19/2010 --> LHC/PCI: 50-70% pD1, 80% pRI, 90/90/90% RCA (overlapping 3.0 x 23 and 3.0 x 12 mm Xience DES); b.) MV 11/10/2018: fixed minimally reversible inferior perfusion defect   Cellulitis of foot    Chronic HFrEF (heart failure with reduced ejection fraction) (HCC)    DDD (degenerative disc disease), cervical    Diabetes mellitus type 2, insulin  dependent (HCC)    Diverticulosis    Full dentures    Gout    Hard of hearing    History of bilateral cataract extraction 2022   History  of ETOH abuse    Hyperlipidemia    Ischemic cardiomyopathy 04/19/2010   a.) TTE 04/19/2010: 40%; b.) TTE 04/20/2014: EF >55%; c.) TTE 11/10/2018: EF 45%; d.) TTE 11/14/2022: EF 25-30%; e. 06/2023 Echo: EF 25-30%, nl RV size/fxn. Mild Manuel. Mild-mod TR. Ao sclerosis w/o stenosis.   Long term current use of aspirin     Long term current use of clopidogrel     Lumbar degenerative disc disease    Lumbar radiculopathy    Lumbar vertebral fracture (chronic superior endplate of L1)    NSTEMI (non-ST elevated myocardial infarction) (HCC) 01/15/2023   OSA (obstructive sleep apnea)    a.) unable to tolerate nocturnal PAP therapy   Peripheral artery disease (HCC)    a.) stenting 05/14/21: 12 mm x 12 cm LifeStent RIGHT dis SFA/prox pop; b.) s/p cath directed thrombolysis RIGHT SFA/pop 06/14/21; c.) s/p mech thrombectomy + stenting 06/15/21: 8 mm x 25cm & 8 mm x 7.5cm Viabahn; d.) s/p BILAT CFA, profunda femoris, SFA endarterectomies + fogarty embolectomy + stenting 11/15/22: 12mm x 58mm Lifestream BILAT CIAs, 14 mm x 6 cm Lifestream & 13 mm x 5 cm Viabahn LEFT EIA   Peripheral neuropathy    Umbilical hernia     Current Outpatient Medications  Medication Sig Dispense Refill   aspirin  EC 81 MG tablet Take 1 tablet (81 mg total) by mouth daily. 150 tablet 2   atorvastatin  (LIPITOR ) 80 MG tablet Take 1 tablet (80 mg total) by mouth at bedtime. 30 tablet 6   carvedilol  (COREG ) 25 MG tablet Take 25 mg by mouth 2 (two) times daily with a meal.     clopidogrel  (PLAVIX ) 75 MG tablet Take 1 tablet (75 mg total) by mouth daily. 30 tablet 11   dapagliflozin  propanediol (FARXIGA ) 10 MG TABS tablet Take 1 tablet (10 mg total) by mouth daily. 30 tablet 2   doxycycline  (VIBRAMYCIN ) 100 MG capsule Take 1 capsule (100 mg total) by mouth 2 (two) times daily. 20 capsule 0   Ensure Max Protein (ENSURE MAX PROTEIN) LIQD Take 330 mLs (11 oz total) by mouth 2 (two) times daily. (Patient taking differently: Take 11 oz by mouth daily.)  10000 mL 3   escitalopram  (LEXAPRO ) 5 MG tablet Take 1 po at bedtime when you start the mirtazapine  15mg  dose. 30 tablet 3   ferrous sulfate 325 (65 FE) MG EC tablet Take 325 mg by mouth daily with breakfast.     folic acid  (FOLVITE ) 1 MG tablet Take 1 tablet (1 mg total) by mouth daily. 90 tablet 1   furosemide  (LASIX ) 40 MG tablet Take 1 tablet (40 mg total) by mouth daily. 30 tablet 2   gabapentin  (NEURONTIN ) 100 MG capsule 2 po at bedtime. (Patient taking differently: Take 300 mg by mouth 3 (three) times daily. 2 po at bedtime.) 60 capsule 3  insulin  aspart protamine - aspart (NOVOLOG  MIX 70/30) (70-30) 100 UNIT/ML injection Inject 30 Units into the skin 2 (two) times daily with a meal.     insulin  glargine (LANTUS  SOLOSTAR) 100 UNIT/ML Solostar Pen Inject 10 Units into the skin daily. 15 mL PRN   methocarbamol  (ROBAXIN ) 500 MG tablet Take 500 mg by mouth every 8 (eight) hours as needed for muscle spasms.     metolazone  (ZAROXOLYN ) 2.5 MG tablet Take 1 tablet (2.5 mg total) by mouth daily for 3 days. Take 30 minutes before your lasix . 3 tablet 3   mirtazapine  (REMERON ) 30 MG tablet Take 1 po at bedtime for 7 days then take 1/2 po at bedtime for 7 days.  Then take 1/4 po q day for 7 days 15 tablet 0   nitroGLYCERIN  (NITROSTAT ) 0.4 MG SL tablet Place 1 tablet (0.4 mg total) under the tongue every 5 (five) minutes as needed for chest pain. Max: 3 tablets per day 100 tablet 3   ondansetron  (ZOFRAN ) 4 MG tablet Take 1 tablet (4 mg total) by mouth every 6 (six) hours as needed for nausea.     polyethylene glycol (MIRALAX  / GLYCOLAX ) 17 g packet Take 17 g by mouth daily as needed for mild constipation. 14 each 0   potassium chloride  SA (KLOR-CON  M) 20 MEQ tablet Take 2 tablets (40 mEq total) by mouth daily. 6 tablet 0   sacubitril -valsartan  (ENTRESTO ) 24-26 MG Take 1 tablet by mouth every 12 (twelve) hours.     senna-docusate (SENOKOT-S) 8.6-50 MG tablet Take 1 tablet by mouth at bedtime as needed for  mild constipation. 30 tablet 0   spironolactone  (ALDACTONE ) 25 MG tablet Take 1 tablet (25 mg total) by mouth daily. 30 tablet 2   thiamine  (VITAMIN B-1) 100 MG tablet Take 1 tablet (100 mg total) by mouth daily. 90 tablet 0   traZODone  (DESYREL ) 150 MG tablet Take 75 mg by mouth at bedtime.     No current facility-administered medications for this visit.    No Known Allergies    Social History   Socioeconomic History   Marital status: Married    Spouse name: [name]   Number of children: 3   Years of education: Not on file   Highest education level: Not on file  Occupational History   Not on file  Tobacco Use   Smoking status: Never    Passive exposure: Never   Smokeless tobacco: Never  Vaping Use   Vaping status: Never Used  Substance and Sexual Activity   Alcohol use: Yes    Alcohol/week: 21.0 standard drinks of alcohol    Types: 7 Glasses of wine, 14 Cans of beer per week    Comment: occassional   Drug use: No   Sexual activity: Not on file  Other Topics Concern   Not on file  Social History Narrative   Not on file   Social Drivers of Health   Financial Resource Strain: Low Risk  (07/11/2023)   Overall Financial Resource Strain (CARDIA)    Difficulty of Paying Living Expenses: Not hard at all  Food Insecurity: No Food Insecurity (07/10/2023)   Hunger Vital Sign    Worried About Running Out of Food in the Last Year: Never true    Ran Out of Food in the Last Year: Never true  Transportation Needs: No Transportation Needs (07/11/2023)   PRAPARE - Administrator, Civil Service (Medical): No    Lack of Transportation (Non-Medical): No  Physical Activity:  Not on file  Stress: Not on file  Social Connections: Unknown (07/10/2023)   Social Connection and Isolation Panel [NHANES]    Frequency of Communication with Friends and Family: More than three times a week    Frequency of Social Gatherings with Friends and Family: More than three times a week    Attends  Religious Services: Patient declined    Database administrator or Organizations: No    Attends Banker Meetings: Patient declined    Marital Status: Married  Catering manager Violence: Not At Risk (07/10/2023)   Humiliation, Afraid, Rape, and Kick questionnaire    Fear of Current or Ex-Partner: No    Emotionally Abused: No    Physically Abused: No    Sexually Abused: No      Family History  Problem Relation Age of Onset   Scoliosis Mother    Heart disease Father     There were no vitals filed for this visit.  Wt Readings from Last 3 Encounters:  09/11/23 191 lb (86.6 kg)  07/28/23 184 lb (83.5 kg)  07/21/23 195 lb 6.4 oz (88.6 kg)   Lab Results  Component Value Date   CREATININE 1.08 07/21/2023   CREATININE 1.22 07/16/2023   CREATININE 0.94 07/15/2023    PHYSICAL EXAM:  General: Well appearing. No resp difficulty HEENT: normal Neck: supple, no JVD Cor: Regular rhythm, rate. No rubs, gallops or murmurs Lungs: clear Abdomen: soft, nontender, nondistended. Extremities: no cyanosis, clubbing, rash, 2+ pitting edema left lower leg up to knee; right BKA Neuro: alert & oriented X 3. Moves all 4 extremities w/o difficulty. Affect pleasant   ECG: not done   ASSESSMENT & PLAN:  1: ICM with reduced ejection fraction- - NYHA class II - fluid up with worsening symptoms and pedal edema - Echo 07/10/23 with LVEF 25-30%, moderately dilated LV, and mild valvular disease.  - continue carvedilol  25mg  BID - continue farxiga  10mg  daily - continue furosemide  40mg  daily - continue entresto  24/26mg  BID - continue spironolactone  25mg  daily - will add metolazone  2.5mg  daily X 3 doses with 40meq potassium daily X 3 doses - get compression sock and put on left leg - doesn't add salt to his food but does eat out 3/4 of the time; reviewed how to make low sodium choices - BMET, pro-BNP today & will message PCP to recheck labs next week - BNP 07/09/23 was 2395.3  2:  HTN- - BP 119/71 - saw PCP Kathlee Pap) 03/25; has upcoming PCP appt @ [facility] 04/25 - BMET 07/16/23 reviewed and showed sodium 133, potassium 3.9, creatinine 1.22 & GFR >60  3: CAD- - was previously followed at [facility] but is now being followed by [facility] (per his request) - does not have post-hospital f/u so referral placed today - [facility] 07/14/23: Severe three-vessel coronary artery disease. Right heart catheterization showed moderately elevated right and left-sided filling pressures, moderate to severe pulmonary hypertension and moderately reduced cardiac output. Successful angioplasty and 3 overlapped drug-eluting stent placement to the LAD. - continue atorvastatin  80mg  daily  4: DM- - A1c 07/10/23 was 8.0% - continue insulin   5: PAD- - saw vascular ([facility]) 02/25 - s/p PTA L common Iliac, R peroneal, R distal SFA/prox popliteal, and stent to R distal SFA/prox popliteal arteries (05/14/21) - continue plavix  75mg  daiy   Since he has PCP appt next week, will have him f/u here in a few weeks. Sooner if needed.   Charlette Console, FNP 09/17/23

## 2023-09-18 ENCOUNTER — Encounter: Admitting: Family

## 2023-09-18 ENCOUNTER — Telehealth: Payer: Self-pay | Admitting: Family

## 2023-09-18 NOTE — Telephone Encounter (Signed)
 Patient did not show for his [facility] appointment on 09/18/23.

## 2023-10-10 ENCOUNTER — Ambulatory Visit: Admitting: Family Medicine

## 2023-10-13 ENCOUNTER — Telehealth: Payer: Self-pay

## 2023-10-13 NOTE — Telephone Encounter (Signed)
 Atient's wife called stating that they would want [employer] to pick up his oxygen tanks as he is not using them. She stated that they had called for it to be picked up but they told them that they needed something in writing for his primary care doctor. Please advise.

## 2023-10-13 NOTE — Telephone Encounter (Signed)
 Dr. [name], please advise.

## 2023-10-14 ENCOUNTER — Other Ambulatory Visit: Payer: Self-pay | Admitting: Family Medicine

## 2023-10-14 DIAGNOSIS — F321 Major depressive disorder, single episode, moderate: Secondary | ICD-10-CM

## 2023-10-14 DIAGNOSIS — F5101 Primary insomnia: Secondary | ICD-10-CM

## 2023-10-14 NOTE — Telephone Encounter (Signed)
 I called [employer] [phone]) and received a message stating they are having technical difficulties and to call back at a later time. Called patient with the attempts to obtain a direct number for [employer] that oxygen tank was obtained from.  LVM requesting a return call.

## 2023-10-27 ENCOUNTER — Ambulatory Visit: Admitting: Cardiovascular Disease

## 2023-10-30 ENCOUNTER — Telehealth (INDEPENDENT_AMBULATORY_CARE_PROVIDER_SITE_OTHER): Admitting: Family Medicine

## 2023-10-30 DIAGNOSIS — F5101 Primary insomnia: Secondary | ICD-10-CM

## 2023-10-30 DIAGNOSIS — F339 Major depressive disorder, recurrent, unspecified: Secondary | ICD-10-CM | POA: Diagnosis not present

## 2023-10-30 DIAGNOSIS — G6289 Other specified polyneuropathies: Secondary | ICD-10-CM | POA: Diagnosis not present

## 2023-10-30 MED ORDER — ESCITALOPRAM OXALATE 10 MG PO TABS: 10.0000 mg | ORAL_TABLET | Freq: Every day | ORAL | 3 refills | Status: DC

## 2023-10-30 MED ORDER — GABAPENTIN 100 MG PO CAPS: ORAL_CAPSULE | ORAL | 3 refills | Status: DC

## 2023-10-30 NOTE — Progress Notes (Signed)
 Established Patient Office Visit  Subjective   Patient ID: Manuel [name], male    DOB: 1946/01/15  Age: 78 y.o. MRN: [id number]  Chief Complaint  Patient presents with   Telehealth Consent    HPI Virtual Visit via Video Note  I connected with Manuel [name] on 10/30/23 at 11:10 AM EDT by a video enabled telemedicine application and verified that I am speaking with the correct person using two identifiers.  Location: Patient: at home Provider: at home   I discussed the limitations of evaluation and management by telemedicine and the availability of in person appointments. The patient expressed understanding and agreed to proceed.  History of Present Illness: Delightful 78 year old gentleman with CAD (s/p PCI to RCA with overlapping stents, NSTEMI 3/25, peak troponin 1045, 3 DES to LAD), ischemic cardiomyopathy (7/24 LVEF 25 to 35% 3/25 echo LVEF 25 to 35% moderate dilatation LV), PAD (2011 s/p PTA L common iliac, right peritoneal, left distal SFA, proximal popliteal, stent to right distal SFA), s/p right BKA, gout, DMT2, mixed hyperlipidemia, HTN, depression/anxiety, insomnia, lumbar DDD and uncontrolled painful diabetic peripheral neuropathy.       He reports about a month ago he started sleeping better.  The pain in his legs has not been as bad as previously and that has helped his sleep.  Still occasionally has a painful night with his neuropathy however.  He takes gabapentin  100 mg at night.  He reports morning grogginess that clears up by lunchtime. He is also working on sleep hygiene.     He reports that he still has depression.  He tapered off mirtazapine  and is now on Lexapro  5 mg.  He has been on the Lexapro  for about a month.   Not aware of any side effects from Lexapro .      He is not drinking as much alcohol.  Had no difficulties with cutting back his alcohol.      He had a low blood sugar the other morning.  His blood glucose was 85 when he woke up and he was sweating but  he denied chest pains, dizziness or confusion.  He has not routinely been checking his sugars but thinks they are running in the 200s.      He got his leg prosthesis and has been up on it and walking a little bit.  He is using a cane.  He has not fallen.  He did drive the car around the block.  This is significant because his amputation is his right leg.  He did not get the referral from physical therapy to help him with gait training.      Manuel [name] asks if B12 would be OK for him to take.  She hopes it might give him more energy.   He has air tanks from [employer]  He is no longer using oxygen and does not feel that he needs it.    Observations/Objective:   Assessment and Plan:     1.  Insomnia-taking Lexapro  5 mg and gabapentin  100 mg at night and is sleeping better.  Has less phantom pain and peripheral neuropathy.  A little bit groggy when he gets up in the morning but it passes in a couple of hours.  Has not fallen.       2.  Insulin -dependent type 2 diabetes.  Had one early morning wakening with blood sugar of 85 and he was quite symptomatic with diaphoresis.  Did not have chest pains.  Please check your  sugar when you get up in the morning a couple of days a week and record.  No change in insulin  dosage at this time. May add a small night time snack before bed.       3.  Depression-is on Lexapro  5 mg for about a month now.  Increase to 10 mg nightly.  Can take 2 of the 5 mg until they are all taken.  Follow-up in a month to assess mood.       4.  Gait training-he has his prosthetic right leg and has been walking some.  Would appreciate physical therapy guiding this gait training.  Will send new referral for PT today.       5.  Fatigue-OK to take OTC B12.       6.  Hypertension-  Reports that he is taking all of his heart medication.  Asked Manuel [name] to check his BP at random times and record.  Follow Up Instructions:    I discussed the assessment and treatment plan with the patient. The  patient was provided an opportunity to ask questions and all were answered. The patient agreed with the plan and demonstrated an understanding of the instructions.   The patient was advised to call back or seek an in-person evaluation if the symptoms worsen or if the condition fails to improve as anticipated.  I provided 14 minutes of non-face-to-face time during this encounter.   Devere MARLA Mock, MD     ROS    Objective:     There were no vitals taken for this visit.     No results found for any visits on 10/30/23.    The ASCVD Risk score (Arnett DK, et al., 2019) failed to calculate for the following reasons:   The 2019 ASCVD risk score is only valid for ages 30 to 69   Risk score cannot be calculated because patient has a medical history suggesting prior/existing ASCVD    Assessment & Plan:  Primary insomnia Assessment & Plan: Taking Lexapro  5 mg and gabapentin  100 mg at night.  Is sleeping better and has less phantom pain and peripheral neuropathy.      No follow-ups on file.    Lorie Cleckley K Shyloh Derosa, MD

## 2023-10-30 NOTE — Assessment & Plan Note (Signed)
 Taking Lexapro  5 mg and gabapentin  100 mg at night.  Is sleeping better and has less phantom pain and peripheral neuropathy.

## 2023-10-31 ENCOUNTER — Telehealth: Payer: Self-pay

## 2023-10-31 ENCOUNTER — Encounter (HOSPITAL_BASED_OUTPATIENT_CLINIC_OR_DEPARTMENT_OTHER): Payer: Self-pay

## 2023-10-31 DIAGNOSIS — Z89511 Acquired absence of right leg below knee: Secondary | ICD-10-CM

## 2023-10-31 NOTE — Telephone Encounter (Signed)
 Referral for home heath PT has been initiated.

## 2023-10-31 NOTE — Telephone Encounter (Signed)
 [name], [name], MD  Rayann Rexene HERO, CMA  Rexene,  Will you get [name] IN HOME physical therapy.  I think you can get it by pointing out that he has an amputated leg and it is difficult for his wife to get him out of the house.  The PT is for gait training with his prosthesis.  Thanks

## 2023-11-03 ENCOUNTER — Telehealth: Payer: Self-pay | Admitting: Family Medicine

## 2023-11-03 ENCOUNTER — Other Ambulatory Visit: Payer: Self-pay | Admitting: Family Medicine

## 2023-11-03 DIAGNOSIS — G6289 Other specified polyneuropathies: Secondary | ICD-10-CM

## 2023-11-03 NOTE — Telephone Encounter (Signed)
 Copied from CRM [id number]. Topic: Referral - Request for Referral >> Nov 03, 2023  8:33 AM Berneda FALCON wrote: Did the patient discuss referral with their provider in the last year? Yes (If No - schedule appointment) (If Yes - send message)  Appointment offered? No, they talked about it a while back and he did not decide until now  Type of order/referral and detailed reason for visit: Pt would like a referral to pain management please  Preference of office, provider, location: [facility] [place] [facility]  [address] (but whichever facility could get him in soonest).  If referral order, have you been seen by this specialty before? No (If Yes, this issue or another issue? When? Where?  Can we respond through MyChart? Yes, but would also like callback please [phone]).

## 2023-11-18 ENCOUNTER — Ambulatory Visit: Attending: Cardiovascular Disease | Admitting: Cardiovascular Disease

## 2023-11-18 ENCOUNTER — Encounter: Payer: Self-pay | Admitting: Cardiovascular Disease

## 2023-11-18 VITALS — BP 80/60 | HR 71 | Ht 68.0 in | Wt 181.5 lb

## 2023-11-18 DIAGNOSIS — I739 Peripheral vascular disease, unspecified: Secondary | ICD-10-CM

## 2023-11-18 DIAGNOSIS — I10 Essential (primary) hypertension: Secondary | ICD-10-CM

## 2023-11-18 DIAGNOSIS — I5022 Chronic systolic (congestive) heart failure: Secondary | ICD-10-CM | POA: Diagnosis not present

## 2023-11-18 DIAGNOSIS — I2510 Atherosclerotic heart disease of native coronary artery without angina pectoris: Secondary | ICD-10-CM | POA: Diagnosis not present

## 2023-11-18 DIAGNOSIS — I252 Old myocardial infarction: Secondary | ICD-10-CM

## 2023-11-18 MED ORDER — ATORVASTATIN CALCIUM 80 MG PO TABS: 80.0000 mg | ORAL_TABLET | Freq: Every day | ORAL | 3 refills | Status: AC

## 2023-11-18 MED ORDER — DAPAGLIFLOZIN PROPANEDIOL 10 MG PO TABS: 10.0000 mg | ORAL_TABLET | Freq: Every day | ORAL | 3 refills | Status: AC

## 2023-11-18 MED ORDER — SACUBITRIL-VALSARTAN 24-26 MG PO TABS: 1.0000 | ORAL_TABLET | Freq: Two times a day (BID) | ORAL | 3 refills | Status: AC

## 2023-11-18 MED ORDER — CLOPIDOGREL BISULFATE 75 MG PO TABS: 75.0000 mg | ORAL_TABLET | Freq: Every day | ORAL | 3 refills | Status: AC

## 2023-11-18 MED ORDER — SPIRONOLACTONE 25 MG PO TABS: 25.0000 mg | ORAL_TABLET | Freq: Every day | ORAL | 3 refills | Status: DC

## 2023-11-18 MED ORDER — FUROSEMIDE 40 MG PO TABS: 40.0000 mg | ORAL_TABLET | Freq: Every day | ORAL | 3 refills | Status: DC

## 2023-11-18 MED ORDER — CARVEDILOL 25 MG PO TABS: 25.0000 mg | ORAL_TABLET | Freq: Two times a day (BID) | ORAL | 3 refills | Status: DC

## 2023-11-18 NOTE — Patient Instructions (Signed)

## 2023-11-18 NOTE — Progress Notes (Signed)
 Cardiology Office Note  Date:  11/18/2023   ID:  Manuel [name], [place] 1946/03/10, MRN [id number]  PCP:  [name], [name], MD   Chief Complaint  Patient presents with   New Patient (Initial Visit)    Ref by ellouise Class, FNP to establish care for CHF/CAD/PAD.  Doing well.     HPI:  [name] is a 78 year old gentleman with past medical history of Chronic combined systolic and diastolic CHF PAD/carotid stenosis Coronary artery disease with unstable angina,  s/p PCI to [facility] with overlapping stents in 2011.  July 09, 2023 with non-STEMI troponin 1045 Ejection fraction 25 to 30% on echo prior to stenting catheterization lab July 14, 2023 ,  Stenting x 3 to the LAD, residual proximal left circumflex disease, chronically occluded RCA extensive PAD s/p PTA L common Iliac, R peroneal, R distal SFA/prox popliteal, and stent to R distal SFA/prox popliteal arteries (05/14/21)  BKA on the right Who presents by referral for his CAD  Cardiac catheterization by one of our partners July 14, 2023, three-vessel coronary disease Felt not to be a candidate for CABG, three-vessel coronary disease 3 stents placed to LAD, difficult procedure  Ejection fraction estimated 25 to 30% On Entresto , carvedilol , spironolactone , Farxiga  Feels euvolemic, denies significant swelling left lower extremity  Discussed recent events concerning his right leg Developed sepsis, cellulitis/osteo and ultimately required R BKA December 2024 R knee I&D for septic/gouty arthritis on 12/30  Spent time in rehab Now with prosthetic leg  In follow-up today reports feeling well, denies chest pain concerning for angina Blood pressure low but reports he is asymptomatic Typically blood pressure higher at home  EKG personally reviewed by myself on todays visit EKG Interpretation Date/Time:  Tuesday November 18 2023 16:28:45 EDT Ventricular Rate:  71 PR Interval:  174 QRS Duration:  96 QT Interval:  418 QTC  Calculation: 454 R Axis:   1  Text Interpretation: Normal sinus rhythm Left ventricular hypertrophy with repolarization abnormality ( R in aVL ) Inferior infarct , age undetermined Cannot rule out Anterior infarct , age undetermined When compared with ECG of 15-Jul-2023 01:10, No significant change was found Confirmed by [name] Lye [phone]) on 11/18/2023 4:44:20 PM    PMH:   has a past medical history of Acute ST elevation myocardial infarction (STEMI) of inferior wall (HCC) (04/19/2010), Allergies, Arthritis, Benign essential hypertension, Bilateral carotid artery disease (HCC) (05/08/2021), CAD (coronary artery disease) (04/19/2010), Cellulitis of foot, Chronic HFrEF (heart failure with reduced ejection fraction) (HCC), DDD (degenerative disc disease), cervical, Diabetes mellitus type 2, insulin  dependent (HCC), Diverticulosis, Full dentures, Gout, Hard of hearing, History of bilateral cataract extraction (2022), History of ETOH abuse, Hyperlipidemia, Ischemic cardiomyopathy (04/19/2010), Long term current use of aspirin , Long term current use of clopidogrel , Lumbar degenerative disc disease, Lumbar radiculopathy, Lumbar vertebral fracture (chronic superior endplate of L1), NSTEMI (non-ST elevated myocardial infarction) (HCC) (01/15/2023), OSA (obstructive sleep apnea), Peripheral artery disease (HCC), Peripheral neuropathy, and Umbilical hernia.  PSH:    Past Surgical History:  Procedure Laterality Date   AMPUTATION Right 11/18/2022   Procedure: AMPUTATION 4TH AND 5TH RAY;  Surgeon: [name], [name];  Location: [facility];  Service: Orthopedics/Podiatry;  Laterality: Right;  4th and 5th toe   AMPUTATION Right 04/20/2023   Procedure: AMPUTATION BELOW KNEE;  Surgeon: [name], MD;  Location: [facility];  Service: Vascular;  Laterality: Right;  block then general   APPLICATION OF WOUND VAC Right 01/09/2023   Procedure: APPLICATION OF WOUND VAC;  Surgeon: [name], MD;  Location: [facility];   Service: Vascular;  Laterality: Right;   CATARACT EXTRACTION W/PHACO Right 03/14/2021   Procedure: CATARACT EXTRACTION PHACO AND INTRAOCULAR LENS PLACEMENT (IOC) RIGHT DIABETIC;  Surgeon: [name], MD;  Location: [facility];  Service: Ophthalmology;  Laterality: Right;  Diabetic 16.78 01:39.9   CATARACT EXTRACTION W/PHACO Left 03/28/2021   Procedure: CATARACT EXTRACTION PHACO AND INTRAOCULAR LENS PLACEMENT (IOC) LEFT DIABETIC 6.93 01:22.0;  Surgeon: [name], MD;  Location: [facility];  Service: Ophthalmology;  Laterality: Left;  Diabetic   COLONOSCOPY     CORONARY ANGIOPLASTY WITH STENT PLACEMENT  03/2010   Procedure: CORONARY ANGIOPLASTY WITH STENT PLACEMENT; Location: [name]   CORONARY STENT INTERVENTION N/A 07/14/2023   Procedure: CORONARY STENT INTERVENTION;  Surgeon: [name], MD;  Location: [facility];  Service: Cardiovascular;  Laterality: N/A;   ENDARTERECTOMY FEMORAL Bilateral 11/15/2022   Procedure: BILATERAL COMMON FEMORAL PROFUNDA FEMORIS AND SUPERFICIAL FEMORAL ARTERY ENDARTECTOMIES, RIGHT FOGARTY EMBOLECTOMY OF THE RIGHT SFA  AND  POPLITEAL ARTERIES. AORTAGRAM AND RIGHT LOWER EXTREMITY ANGIOGRAM.;  Surgeon: [name], MD;  Location: [facility];  Service: Vascular;  Laterality: Bilateral;   INSERTION OF ILIAC STENT Bilateral 11/15/2022   Procedure: BILATERAL STENT INSERTION IN BILATERAL  COMMON ILIAC ARTERY, STENT INSERTION OF LEFT EXTERNAL ILIAC ARTERY. ANGIOPLASTY RIGHT TIBIAL  AND POPLITEAL ARTERY.;  Surgeon: [name], MD;  Location: [facility];  Service: Vascular;  Laterality: Bilateral;   IRRIGATION AND DEBRIDEMENT KNEE Right 04/21/2023   Procedure: IRRIGATION AND DEBRIDEMENT KNEE;  Surgeon: Zafonte, Brian Chaseton, MD;  Location: [facility];  Service: Orthopedics;  Laterality: Right;   LOWER EXTREMITY ANGIOGRAPHY Right 05/14/2021   Procedure: LOWER EXTREMITY ANGIOGRAPHY;  Surgeon: [name], MD;  Location: [facility];  Service: Cardiovascular;  Laterality: Right;   LOWER EXTREMITY ANGIOGRAPHY Right 06/14/2021   Procedure: Lower Extremity Angiography;  Surgeon: [name], MD;  Location: [facility];  Service: Cardiovascular;  Laterality: Right;   LOWER EXTREMITY ANGIOGRAPHY Right 11/11/2022   Procedure: Lower Extremity Angiography;  Surgeon: [name], MD;  Location: [facility];  Service: Cardiovascular;  Laterality: Right;   LOWER EXTREMITY INTERVENTION Right 06/15/2021   Procedure: LOWER EXTREMITY INTERVENTION;  Surgeon: [name], MD;  Location: [facility];  Service: Cardiovascular;  Laterality: Right;   RIGHT/LEFT HEART CATH AND CORONARY ANGIOGRAPHY N/A 07/14/2023   Procedure: RIGHT/LEFT HEART CATH AND CORONARY ANGIOGRAPHY;  Surgeon: [name], MD;  Location: [facility];  Service: Cardiovascular;  Laterality: N/A;   TONSILLECTOMY     TRANSMETATARSAL AMPUTATION Right 02/17/2023   Procedure: TRANSMETATARSAL AMPUTATION;  Surgeon: Lennie Barter, [name];  Location: [facility];  Service: Orthopedics/Podiatry;  Laterality: Right;   WOUND DEBRIDEMENT Right 01/09/2023   Procedure: DEBRIDEMENT WOUND;  Surgeon: [name], MD;  Location: [facility];  Service: Vascular;  Laterality: Right;    Current Outpatient Medications  Medication Sig Dispense Refill   aspirin  EC 81 MG tablet Take 1 tablet (81 mg total) by mouth daily. 150 tablet 2   atorvastatin  (LIPITOR ) 80 MG tablet Take 1 tablet (80 mg total) by mouth at bedtime. 30 tablet 6   carvedilol  (COREG ) 25 MG tablet Take 25 mg by mouth 2 (two) times daily with a meal.     clopidogrel  (PLAVIX ) 75 MG tablet Take 1 tablet (75 mg total) by mouth daily. 30 tablet 11   dapagliflozin  propanediol (FARXIGA )  10 MG TABS tablet Take 1 tablet (10 mg total) by mouth daily. 30 tablet 2   Ensure Max Protein (ENSURE MAX PROTEIN) LIQD Take 330 mLs (11 oz total) by mouth 2 (two) times daily. (Patient taking differently: Take 11  oz by mouth daily.) 10000 mL 3   escitalopram  (LEXAPRO ) 10 MG tablet Take 1 tablet (10 mg total) by mouth daily. (Patient taking differently: Take 20 mg by mouth daily.) 90 tablet 3   ferrous sulfate 325 (65 FE) MG EC tablet Take 325 mg by mouth daily with breakfast.     folic acid  (FOLVITE ) 1 MG tablet Take 1 tablet (1 mg total) by mouth daily. 90 tablet 1   furosemide  (LASIX ) 40 MG tablet Take 1 tablet (40 mg total) by mouth daily. 30 tablet 2   gabapentin  (NEURONTIN ) 100 MG capsule 1 po at bedtime. 90 capsule 3   insulin  aspart protamine - aspart (NOVOLOG  MIX 70/30) (70-30) 100 UNIT/ML injection Inject 30 Units into the skin 2 (two) times daily with a meal.     methocarbamol  (ROBAXIN ) 500 MG tablet Take 500 mg by mouth every 8 (eight) hours as needed for muscle spasms.     nitroGLYCERIN  (NITROSTAT ) 0.4 MG SL tablet Place 1 tablet (0.4 mg total) under the tongue every 5 (five) minutes as needed for chest pain. Max: 3 tablets per day 100 tablet 3   ondansetron  (ZOFRAN ) 4 MG tablet Take 1 tablet (4 mg total) by mouth every 6 (six) hours as needed for nausea.     polyethylene glycol (MIRALAX  / GLYCOLAX ) 17 g packet Take 17 g by mouth daily as needed for mild constipation. 14 each 0   sacubitril -valsartan  (ENTRESTO ) 24-26 MG Take 1 tablet by mouth every 12 (twelve) hours.     senna-docusate (SENOKOT-S) 8.6-50 MG tablet Take 1 tablet by mouth at bedtime as needed for mild constipation. 30 tablet 0   spironolactone  (ALDACTONE ) 25 MG tablet Take 1 tablet (25 mg total) by mouth daily. 30 tablet 2   thiamine  (VITAMIN B-1) 100 MG tablet Take 1 tablet (100 mg total) by mouth daily. 90 tablet 0   insulin  glargine (LANTUS  SOLOSTAR) 100 UNIT/ML Solostar Pen Inject 10 Units into the skin daily. (Patient not taking: Reported on 11/18/2023) 15 mL PRN   metolazone  (ZAROXOLYN ) 2.5 MG tablet Take 1 tablet (2.5 mg total) by mouth daily for 3 days. Take 30 minutes before your lasix . (Patient not taking: Reported on  11/18/2023) 3 tablet 3   traZODone  (DESYREL ) 150 MG tablet Take 75 mg by mouth at bedtime. (Patient not taking: Reported on 11/18/2023)     No current facility-administered medications for this visit.    Allergies:   Patient has no known allergies.   Social History:  The patient  reports that he has never smoked. He has never been exposed to tobacco smoke. He has never used smokeless tobacco. He reports current alcohol use of about 21.0 standard drinks of alcohol per week. He reports that he does not use drugs.   Family History:   family history includes Heart disease in his father; Scoliosis in his mother.    Review of Systems: Review of Systems  Constitutional: Negative.   HENT: Negative.    Respiratory: Negative.    Cardiovascular: Negative.   Gastrointestinal: Negative.   Musculoskeletal: Negative.   Neurological: Negative.   Psychiatric/Behavioral: Negative.    All other systems reviewed and are negative.   PHYSICAL EXAM: VS:  BP (!) 80/60 (BP Location: Right Arm, Patient  Position: Sitting, Cuff Size: Normal)   Pulse 71   Ht 5' 8 (1.727 m)   Wt 181 lb 8 oz (82.3 kg)   SpO2 95%   BMI 27.60 kg/m  , BMI Body mass index is 27.6 kg/m. GEN: Well nourished, well developed, in no acute distress HEENT: normal Neck: no JVD, carotid bruits, or masses Cardiac: RRR; no murmurs, rubs, or gallops,no edema  Respiratory:  clear to auscultation bilaterally, normal work of breathing GI: soft, nontender, nondistended, + BS MS: no deformity or atrophy Skin: warm and dry, no rash Neuro:  Strength and sensation are intact Psych: euthymic mood, full affect   Recent Labs: 04/19/2023: TSH 1.416 07/09/2023: ALT 14; B Natriuretic Peptide 2,395.3 07/16/2023: Hemoglobin 10.0; Magnesium  2.0; Platelets 222 07/21/2023: BUN 30; Creatinine, Ser 1.08; NT-Pro BNP 9,804; Potassium 4.4; Sodium 137    Lipid Panel Lab Results  Component Value Date   CHOL 59 07/13/2023   HDL 32 (L) 07/13/2023    LDLCALC 16 07/13/2023   TRIG 55 07/13/2023      Wt Readings from Last 3 Encounters:  11/18/23 181 lb 8 oz (82.3 kg)  09/11/23 191 lb (86.6 kg)  07/28/23 184 lb (83.5 kg)     ASSESSMENT AND PLAN:  Problem List Items Addressed This Visit       Cardiology Problems   Coronary artery disease with history of myocardial infarction without history of CABG - Primary   Relevant Orders   EKG 12-Lead (Completed)   Other Visit Diagnoses       Chronic systolic heart failure (HCC)       Relevant Orders   EKG 12-Lead (Completed)     Peripheral arterial disease (HCC)       Relevant Orders   EKG 12-Lead (Completed)     Primary hypertension       Relevant Orders   EKG 12-Lead (Completed)      Coronary artery disease with unstable angina, non-STEMI Presenting July 09, 2023 with non-STEMI Peak troponin 1045 -Taken to the catheterization lab July 14, 2023 ,  Stenting x 3 to the LAD, residual proximal left circumflex disease, chronically occluded RCA -On aspirin  Plavix ,  Currently denies anginal symptoms  -Catheterization note indicates for refractory anginal symptoms could consider stenting to left circumflex Recommend we continue aspirin  81 daily with Plavix  75 daily, carvedilol  25 twice daily, Lipitor  80 daily   Cardiomyopathy, acute on chronic diastolic and systolic CHF Appears euvolemic, continue carvedilol , Farxiga , Lasix  40 daily, spironolactone  25 daily, Entresto  24/26 twice daily  PAD Multiple interventions in the past, right BKA December 2024 On Plavix , Lipitor  80 daily   Diabetes type 2 A1c 8.0, management per primary care   Hyperlipidemia Numbers well-controlled on Lipitor  80 daily   Essential hypertension Low blood pressure, recommend he continue to monitor numbers at home For systolic pressures less than 100 recommend he call our office  Signed, Velinda Lunger, M.D., Ph.D. [facility] [place], [place] [phone]

## 2023-12-04 ENCOUNTER — Other Ambulatory Visit: Payer: Self-pay

## 2023-12-04 ENCOUNTER — Emergency Department

## 2023-12-04 ENCOUNTER — Inpatient Hospital Stay
Admission: EM | Admit: 2023-12-04 | Discharge: 2024-01-01 | DRG: 252 | Disposition: A | Discharge status: Skilled Nursing Facility | Attending: Student in an Organized Health Care Education/Training Program | Admitting: Student in an Organized Health Care Education/Training Program

## 2023-12-04 ENCOUNTER — Encounter
Admission: EM | Disposition: A | Discharge status: Skilled Nursing Facility | Payer: Self-pay | Source: Home / Self Care | Attending: Internal Medicine

## 2023-12-04 DIAGNOSIS — R578 Other shock: Secondary | ICD-10-CM

## 2023-12-04 DIAGNOSIS — I214 Non-ST elevation (NSTEMI) myocardial infarction: Secondary | ICD-10-CM | POA: Diagnosis not present

## 2023-12-04 DIAGNOSIS — W010XXA Fall on same level from slipping, tripping and stumbling without subsequent striking against object, initial encounter: Secondary | ICD-10-CM | POA: Diagnosis present

## 2023-12-04 DIAGNOSIS — Z89511 Acquired absence of right leg below knee: Principal | ICD-10-CM

## 2023-12-04 DIAGNOSIS — Z79899 Other long term (current) drug therapy: Secondary | ICD-10-CM | POA: Diagnosis not present

## 2023-12-04 DIAGNOSIS — G4730 Sleep apnea, unspecified: Secondary | ICD-10-CM | POA: Insufficient documentation

## 2023-12-04 DIAGNOSIS — I472 Ventricular tachycardia, unspecified: Secondary | ICD-10-CM | POA: Diagnosis not present

## 2023-12-04 DIAGNOSIS — L299 Pruritus, unspecified: Secondary | ICD-10-CM | POA: Diagnosis not present

## 2023-12-04 DIAGNOSIS — I081 Rheumatic disorders of both mitral and tricuspid valves: Secondary | ICD-10-CM | POA: Diagnosis present

## 2023-12-04 DIAGNOSIS — I4729 Other ventricular tachycardia: Secondary | ICD-10-CM | POA: Insufficient documentation

## 2023-12-04 DIAGNOSIS — R7881 Bacteremia: Secondary | ICD-10-CM

## 2023-12-04 DIAGNOSIS — I2510 Atherosclerotic heart disease of native coronary artery without angina pectoris: Secondary | ICD-10-CM | POA: Diagnosis not present

## 2023-12-04 DIAGNOSIS — W19XXXA Unspecified fall, initial encounter: Secondary | ICD-10-CM

## 2023-12-04 DIAGNOSIS — Y838 Other surgical procedures as the cause of abnormal reaction of the patient, or of later complication, without mention of misadventure at the time of the procedure: Secondary | ICD-10-CM | POA: Diagnosis not present

## 2023-12-04 DIAGNOSIS — E7800 Pure hypercholesterolemia, unspecified: Secondary | ICD-10-CM | POA: Diagnosis present

## 2023-12-04 DIAGNOSIS — S301XXA Contusion of abdominal wall, initial encounter: Secondary | ICD-10-CM | POA: Diagnosis not present

## 2023-12-04 DIAGNOSIS — I771 Stricture of artery: Secondary | ICD-10-CM

## 2023-12-04 DIAGNOSIS — Z7902 Long term (current) use of antithrombotics/antiplatelets: Secondary | ICD-10-CM

## 2023-12-04 DIAGNOSIS — R197 Diarrhea, unspecified: Secondary | ICD-10-CM | POA: Insufficient documentation

## 2023-12-04 DIAGNOSIS — Y92230 Patient room in hospital as the place of occurrence of the external cause: Secondary | ICD-10-CM | POA: Diagnosis not present

## 2023-12-04 DIAGNOSIS — M109 Gout, unspecified: Secondary | ICD-10-CM | POA: Diagnosis present

## 2023-12-04 DIAGNOSIS — T8189XA Other complications of procedures, not elsewhere classified, initial encounter: Secondary | ICD-10-CM | POA: Diagnosis not present

## 2023-12-04 DIAGNOSIS — F32A Depression, unspecified: Secondary | ICD-10-CM | POA: Diagnosis present

## 2023-12-04 DIAGNOSIS — E1142 Type 2 diabetes mellitus with diabetic polyneuropathy: Secondary | ICD-10-CM | POA: Diagnosis present

## 2023-12-04 DIAGNOSIS — E11649 Type 2 diabetes mellitus with hypoglycemia without coma: Secondary | ICD-10-CM | POA: Diagnosis not present

## 2023-12-04 DIAGNOSIS — I252 Old myocardial infarction: Secondary | ICD-10-CM

## 2023-12-04 DIAGNOSIS — F1011 Alcohol abuse, in remission: Secondary | ICD-10-CM | POA: Insufficient documentation

## 2023-12-04 DIAGNOSIS — Y832 Surgical operation with anastomosis, bypass or graft as the cause of abnormal reaction of the patient, or of later complication, without mention of misadventure at the time of the procedure: Secondary | ICD-10-CM | POA: Diagnosis present

## 2023-12-04 DIAGNOSIS — S75009A Unspecified injury of femoral artery, unspecified leg, initial encounter: Secondary | ICD-10-CM

## 2023-12-04 DIAGNOSIS — T8149XA Infection following a procedure, other surgical site, initial encounter: Secondary | ICD-10-CM | POA: Diagnosis not present

## 2023-12-04 DIAGNOSIS — I70203 Unspecified atherosclerosis of native arteries of extremities, bilateral legs: Secondary | ICD-10-CM | POA: Diagnosis not present

## 2023-12-04 DIAGNOSIS — D62 Acute posthemorrhagic anemia: Secondary | ICD-10-CM | POA: Diagnosis not present

## 2023-12-04 DIAGNOSIS — R6520 Severe sepsis without septic shock: Secondary | ICD-10-CM | POA: Diagnosis present

## 2023-12-04 DIAGNOSIS — R0902 Hypoxemia: Secondary | ICD-10-CM | POA: Diagnosis present

## 2023-12-04 DIAGNOSIS — I5023 Acute on chronic systolic (congestive) heart failure: Secondary | ICD-10-CM | POA: Diagnosis present

## 2023-12-04 DIAGNOSIS — A498 Other bacterial infections of unspecified site: Secondary | ICD-10-CM | POA: Diagnosis not present

## 2023-12-04 DIAGNOSIS — T8141XA Infection following a procedure, superficial incisional surgical site, initial encounter: Secondary | ICD-10-CM | POA: Diagnosis not present

## 2023-12-04 DIAGNOSIS — Z9889 Other specified postprocedural states: Secondary | ICD-10-CM | POA: Diagnosis not present

## 2023-12-04 DIAGNOSIS — R600 Localized edema: Secondary | ICD-10-CM | POA: Diagnosis not present

## 2023-12-04 DIAGNOSIS — I110 Hypertensive heart disease with heart failure: Secondary | ICD-10-CM | POA: Diagnosis present

## 2023-12-04 DIAGNOSIS — E875 Hyperkalemia: Secondary | ICD-10-CM | POA: Diagnosis not present

## 2023-12-04 DIAGNOSIS — T8119XA Other postprocedural shock, initial encounter: Secondary | ICD-10-CM | POA: Diagnosis not present

## 2023-12-04 DIAGNOSIS — J189 Pneumonia, unspecified organism: Secondary | ICD-10-CM | POA: Diagnosis present

## 2023-12-04 DIAGNOSIS — I724 Aneurysm of artery of lower extremity: Secondary | ICD-10-CM | POA: Diagnosis present

## 2023-12-04 DIAGNOSIS — E785 Hyperlipidemia, unspecified: Secondary | ICD-10-CM | POA: Diagnosis not present

## 2023-12-04 DIAGNOSIS — M79651 Pain in right thigh: Secondary | ICD-10-CM | POA: Diagnosis not present

## 2023-12-04 DIAGNOSIS — I255 Ischemic cardiomyopathy: Secondary | ICD-10-CM | POA: Diagnosis present

## 2023-12-04 DIAGNOSIS — Z95828 Presence of other vascular implants and grafts: Secondary | ICD-10-CM | POA: Diagnosis not present

## 2023-12-04 DIAGNOSIS — T827XXA Infection and inflammatory reaction due to other cardiac and vascular devices, implants and grafts, initial encounter: Principal | ICD-10-CM | POA: Diagnosis present

## 2023-12-04 DIAGNOSIS — I5022 Chronic systolic (congestive) heart failure: Secondary | ICD-10-CM | POA: Diagnosis not present

## 2023-12-04 DIAGNOSIS — L89899 Pressure ulcer of other site, unspecified stage: Secondary | ICD-10-CM | POA: Diagnosis not present

## 2023-12-04 DIAGNOSIS — M479 Spondylosis, unspecified: Secondary | ICD-10-CM | POA: Diagnosis present

## 2023-12-04 DIAGNOSIS — E871 Hypo-osmolality and hyponatremia: Secondary | ICD-10-CM | POA: Diagnosis present

## 2023-12-04 DIAGNOSIS — Z66 Do not resuscitate: Secondary | ICD-10-CM | POA: Diagnosis present

## 2023-12-04 DIAGNOSIS — Z7982 Long term (current) use of aspirin: Secondary | ICD-10-CM

## 2023-12-04 DIAGNOSIS — A4153 Sepsis due to Serratia: Secondary | ICD-10-CM | POA: Diagnosis present

## 2023-12-04 DIAGNOSIS — R21 Rash and other nonspecific skin eruption: Secondary | ICD-10-CM | POA: Diagnosis present

## 2023-12-04 DIAGNOSIS — Z794 Long term (current) use of insulin: Secondary | ICD-10-CM | POA: Diagnosis not present

## 2023-12-04 DIAGNOSIS — D508 Other iron deficiency anemias: Secondary | ICD-10-CM | POA: Diagnosis not present

## 2023-12-04 DIAGNOSIS — R7989 Other specified abnormal findings of blood chemistry: Secondary | ICD-10-CM | POA: Diagnosis not present

## 2023-12-04 DIAGNOSIS — S75091A Other specified injury of femoral artery, right leg, initial encounter: Secondary | ICD-10-CM

## 2023-12-04 DIAGNOSIS — E1151 Type 2 diabetes mellitus with diabetic peripheral angiopathy without gangrene: Secondary | ICD-10-CM | POA: Diagnosis present

## 2023-12-04 DIAGNOSIS — E119 Type 2 diabetes mellitus without complications: Secondary | ICD-10-CM | POA: Diagnosis not present

## 2023-12-04 DIAGNOSIS — I772 Rupture of artery: Secondary | ICD-10-CM | POA: Diagnosis present

## 2023-12-04 DIAGNOSIS — I97638 Postprocedural hematoma of a circulatory system organ or structure following other circulatory system procedure: Secondary | ICD-10-CM | POA: Diagnosis not present

## 2023-12-04 DIAGNOSIS — E1165 Type 2 diabetes mellitus with hyperglycemia: Secondary | ICD-10-CM | POA: Diagnosis present

## 2023-12-04 DIAGNOSIS — I5020 Unspecified systolic (congestive) heart failure: Secondary | ICD-10-CM

## 2023-12-04 DIAGNOSIS — Z961 Presence of intraocular lens: Secondary | ICD-10-CM | POA: Diagnosis present

## 2023-12-04 DIAGNOSIS — R531 Weakness: Principal | ICD-10-CM

## 2023-12-04 DIAGNOSIS — R571 Hypovolemic shock: Secondary | ICD-10-CM | POA: Diagnosis not present

## 2023-12-04 DIAGNOSIS — A419 Sepsis, unspecified organism: Secondary | ICD-10-CM | POA: Diagnosis not present

## 2023-12-04 DIAGNOSIS — I739 Peripheral vascular disease, unspecified: Secondary | ICD-10-CM | POA: Diagnosis present

## 2023-12-04 DIAGNOSIS — I5042 Chronic combined systolic (congestive) and diastolic (congestive) heart failure: Secondary | ICD-10-CM | POA: Diagnosis present

## 2023-12-04 DIAGNOSIS — B9689 Other specified bacterial agents as the cause of diseases classified elsewhere: Secondary | ICD-10-CM | POA: Diagnosis not present

## 2023-12-04 DIAGNOSIS — E872 Acidosis, unspecified: Secondary | ICD-10-CM | POA: Diagnosis present

## 2023-12-04 DIAGNOSIS — I10 Essential (primary) hypertension: Secondary | ICD-10-CM | POA: Diagnosis not present

## 2023-12-04 DIAGNOSIS — D509 Iron deficiency anemia, unspecified: Secondary | ICD-10-CM | POA: Diagnosis present

## 2023-12-04 DIAGNOSIS — Z7984 Long term (current) use of oral hypoglycemic drugs: Secondary | ICD-10-CM

## 2023-12-04 DIAGNOSIS — K5900 Constipation, unspecified: Secondary | ICD-10-CM | POA: Diagnosis present

## 2023-12-04 DIAGNOSIS — Z8249 Family history of ischemic heart disease and other diseases of the circulatory system: Secondary | ICD-10-CM

## 2023-12-04 DIAGNOSIS — M1990 Unspecified osteoarthritis, unspecified site: Secondary | ICD-10-CM | POA: Diagnosis present

## 2023-12-04 DIAGNOSIS — R6510 Systemic inflammatory response syndrome (SIRS) of non-infectious origin without acute organ dysfunction: Secondary | ICD-10-CM

## 2023-12-04 DIAGNOSIS — F1020 Alcohol dependence, uncomplicated: Secondary | ICD-10-CM | POA: Diagnosis present

## 2023-12-04 DIAGNOSIS — Z9862 Peripheral vascular angioplasty status: Secondary | ICD-10-CM

## 2023-12-04 DIAGNOSIS — G4733 Obstructive sleep apnea (adult) (pediatric): Secondary | ICD-10-CM | POA: Diagnosis present

## 2023-12-04 DIAGNOSIS — Z955 Presence of coronary angioplasty implant and graft: Secondary | ICD-10-CM

## 2023-12-04 DIAGNOSIS — R1031 Right lower quadrant pain: Secondary | ICD-10-CM | POA: Diagnosis present

## 2023-12-04 DIAGNOSIS — I7143 Infrarenal abdominal aortic aneurysm, without rupture: Secondary | ICD-10-CM | POA: Diagnosis present

## 2023-12-04 DIAGNOSIS — Z9841 Cataract extraction status, right eye: Secondary | ICD-10-CM

## 2023-12-04 DIAGNOSIS — E639 Nutritional deficiency, unspecified: Secondary | ICD-10-CM | POA: Diagnosis present

## 2023-12-04 DIAGNOSIS — I358 Other nonrheumatic aortic valve disorders: Secondary | ICD-10-CM | POA: Diagnosis present

## 2023-12-04 DIAGNOSIS — Z9842 Cataract extraction status, left eye: Secondary | ICD-10-CM

## 2023-12-04 DIAGNOSIS — T827XXS Infection and inflammatory reaction due to other cardiac and vascular devices, implants and grafts, sequela: Secondary | ICD-10-CM | POA: Diagnosis not present

## 2023-12-04 DIAGNOSIS — N179 Acute kidney failure, unspecified: Secondary | ICD-10-CM | POA: Diagnosis present

## 2023-12-04 DIAGNOSIS — T81718A Complication of other artery following a procedure, not elsewhere classified, initial encounter: Secondary | ICD-10-CM | POA: Diagnosis not present

## 2023-12-04 DIAGNOSIS — E878 Other disorders of electrolyte and fluid balance, not elsewhere classified: Secondary | ICD-10-CM | POA: Diagnosis present

## 2023-12-04 HISTORY — PX: LOWER EXTREMITY ANGIOGRAPHY: CATH118251

## 2023-12-04 LAB — CBC WITH DIFFERENTIAL/PLATELET
Abs Immature Granulocytes: 0.04 K/uL (ref 0.00–0.07)
Basophils Absolute: 0 K/uL (ref 0.0–0.1)
Basophils Relative: 0 %
Eosinophils Absolute: 0.1 K/uL (ref 0.0–0.5)
Eosinophils Relative: 1 %
HCT: 42.9 % (ref 39.0–52.0)
Hemoglobin: 14.3 g/dL (ref 13.0–17.0)
Immature Granulocytes: 0 %
Lymphocytes Relative: 4 %
Lymphs Abs: 0.5 K/uL — ABNORMAL LOW (ref 0.7–4.0)
MCH: 28.2 pg (ref 26.0–34.0)
MCHC: 33.3 g/dL (ref 30.0–36.0)
MCV: 84.6 fL (ref 80.0–100.0)
Monocytes Absolute: 0.5 K/uL (ref 0.1–1.0)
Monocytes Relative: 4 %
Neutro Abs: 10.5 K/uL — ABNORMAL HIGH (ref 1.7–7.7)
Neutrophils Relative %: 91 %
Platelets: 191 K/uL (ref 150–400)
RBC: 5.07 MIL/uL (ref 4.22–5.81)
RDW: 15.3 % (ref 11.5–15.5)
WBC: 11.6 K/uL — ABNORMAL HIGH (ref 4.0–10.5)
nRBC: 0 % (ref 0.0–0.2)

## 2023-12-04 LAB — CBC
HCT: 38.3 % — ABNORMAL LOW (ref 39.0–52.0)
Hemoglobin: 13.3 g/dL (ref 13.0–17.0)
MCH: 28.5 pg (ref 26.0–34.0)
MCHC: 34.7 g/dL (ref 30.0–36.0)
MCV: 82.2 fL (ref 80.0–100.0)
Platelets: 157 K/uL (ref 150–400)
RBC: 4.66 MIL/uL (ref 4.22–5.81)
RDW: 15.3 % (ref 11.5–15.5)
WBC: 11 K/uL — ABNORMAL HIGH (ref 4.0–10.5)
nRBC: 0 % (ref 0.0–0.2)

## 2023-12-04 LAB — CBG MONITORING, ED: Glucose-Capillary: 411 mg/dL — ABNORMAL HIGH (ref 70–99)

## 2023-12-04 LAB — BASIC METABOLIC PANEL WITH GFR
Anion gap: 13 (ref 5–15)
BUN: 23 mg/dL (ref 8–23)
CO2: 18 mmol/L — ABNORMAL LOW (ref 22–32)
Calcium: 8.4 mg/dL — ABNORMAL LOW (ref 8.9–10.3)
Chloride: 94 mmol/L — ABNORMAL LOW (ref 98–111)
Creatinine, Ser: 1.12 mg/dL (ref 0.61–1.24)
GFR, Estimated: >60 mL/min (ref >60)
Glucose, Bld: 394 mg/dL — ABNORMAL HIGH (ref 70–99)
Potassium: 5.3 mmol/L — ABNORMAL HIGH (ref 3.5–5.1)
Sodium: 125 mmol/L — ABNORMAL LOW (ref 135–145)

## 2023-12-04 LAB — COMPREHENSIVE METABOLIC PANEL WITH GFR
ALT: 15 U/L (ref 0–44)
ALT: 17 U/L (ref 0–44)
AST: 27 U/L (ref 15–41)
AST: 44 U/L — ABNORMAL HIGH (ref 15–41)
Albumin: 3.5 g/dL (ref 3.5–5.0)
Albumin: 3.1 g/dL — ABNORMAL LOW (ref 3.5–5.0)
Alkaline Phosphatase: 90 U/L (ref 38–126)
Alkaline Phosphatase: 77 U/L (ref 38–126)
Anion gap: 12 (ref 5–15)
Anion gap: 12 (ref 5–15)
BUN: 18 mg/dL (ref 8–23)
BUN: 22 mg/dL (ref 8–23)
CO2: 23 mmol/L (ref 22–32)
CO2: 19 mmol/L — ABNORMAL LOW (ref 22–32)
Calcium: 8.9 mg/dL (ref 8.9–10.3)
Calcium: 8.6 mg/dL — ABNORMAL LOW (ref 8.9–10.3)
Chloride: 91 mmol/L — ABNORMAL LOW (ref 98–111)
Chloride: 93 mmol/L — ABNORMAL LOW (ref 98–111)
Creatinine, Ser: 1.12 mg/dL (ref 0.61–1.24)
Creatinine, Ser: 0.98 mg/dL (ref 0.61–1.24)
GFR, Estimated: >60 mL/min (ref >60)
GFR, Estimated: >60 mL/min (ref >60)
Glucose, Bld: 438 mg/dL — ABNORMAL HIGH (ref 70–99)
Glucose, Bld: 395 mg/dL — ABNORMAL HIGH (ref 70–99)
Potassium: 5.3 mmol/L — ABNORMAL HIGH (ref 3.5–5.1)
Potassium: 5.2 mmol/L — ABNORMAL HIGH (ref 3.5–5.1)
Sodium: 126 mmol/L — ABNORMAL LOW (ref 135–145)
Sodium: 124 mmol/L — ABNORMAL LOW (ref 135–145)
Total Bilirubin: 1.7 mg/dL — ABNORMAL HIGH (ref 0.0–1.2)
Total Bilirubin: 2 mg/dL — ABNORMAL HIGH (ref 0.0–1.2)
Total Protein: 6.9 g/dL (ref 6.5–8.1)
Total Protein: 6.5 g/dL (ref 6.5–8.1)

## 2023-12-04 LAB — RESP PANEL BY RT-PCR (RSV, FLU A&B, COVID)  RVPGX2
Influenza A by PCR: NEGATIVE
Influenza B by PCR: NEGATIVE
Resp Syncytial Virus by PCR: NEGATIVE
SARS Coronavirus 2 by RT PCR: NEGATIVE

## 2023-12-04 LAB — URINALYSIS, W/ REFLEX TO CULTURE (INFECTION SUSPECTED)
Bilirubin Urine: NEGATIVE
Glucose, UA: >=500 mg/dL — AB
Ketones, ur: 20 mg/dL — AB
Leukocytes,Ua: NEGATIVE
Nitrite: NEGATIVE
Protein, ur: >=300 mg/dL — AB
Specific Gravity, Urine: 1.023 (ref 1.005–1.030)
pH: 5 (ref 5.0–8.0)

## 2023-12-04 LAB — BRAIN NATRIURETIC PEPTIDE: B Natriuretic Peptide: 3374 pg/mL — ABNORMAL HIGH (ref 0.0–100.0)

## 2023-12-04 LAB — LACTIC ACID, PLASMA
Lactic Acid, Venous: 2.9 mmol/L (ref 0.5–1.9)
Lactic Acid, Venous: 3.3 mmol/L (ref 0.5–1.9)
Lactic Acid, Venous: 2.7 mmol/L (ref 0.5–1.9)

## 2023-12-04 LAB — CK: Total CK: 115 U/L (ref 49–397)

## 2023-12-04 LAB — TYPE AND SCREEN
ABO/RH(D): A POS
Antibody Screen: NEGATIVE

## 2023-12-04 LAB — TROPONIN I (HIGH SENSITIVITY)
Troponin I (High Sensitivity): 715 ng/L (ref <18)
Troponin I (High Sensitivity): 6596 ng/L (ref <18)
Troponin I (High Sensitivity): 8119 ng/L (ref <18)

## 2023-12-04 LAB — APTT: aPTT: 55 s — ABNORMAL HIGH (ref 24–36)

## 2023-12-04 LAB — PROTIME-INR
INR: 1.3 — ABNORMAL HIGH (ref 0.8–1.2)
Prothrombin Time: 17 s — ABNORMAL HIGH (ref 11.4–15.2)

## 2023-12-04 LAB — MRSA NEXT GEN BY PCR, NASAL: MRSA by PCR Next Gen: NOT DETECTED

## 2023-12-04 LAB — GLUCOSE, CAPILLARY: Glucose-Capillary: 431 mg/dL — ABNORMAL HIGH (ref 70–99)

## 2023-12-04 SURGERY — LOWER EXTREMITY ANGIOGRAPHY
Anesthesia: Moderate Sedation | Laterality: Right

## 2023-12-04 MED ORDER — OXYCODONE HCL 5 MG PO TABS
5.0000 mg | ORAL_TABLET | ORAL | Status: DC | PRN
  Administered 2023-12-05 (×2): 5 mg via ORAL
  Administered 2023-12-06: 10 mg via ORAL
  Administered 2023-12-06: 5 mg via ORAL
  Administered 2023-12-07 – 2023-12-08 (×3): 10 mg via ORAL
  Administered 2023-12-10: 5 mg via ORAL
  Administered 2023-12-11 (×2): 10 mg via ORAL
  Filled 2023-12-04: qty 2
  Filled 2023-12-04 (×2): qty 1
  Filled 2023-12-04: qty 2
  Filled 2023-12-04: qty 1
  Filled 2023-12-04 (×4): qty 2
  Filled 2023-12-04: qty 1

## 2023-12-04 MED ORDER — LIDOCAINE HCL (PF) 1 % IJ SOLN
INTRAMUSCULAR | Status: DC | PRN
  Administered 2023-12-04: 10 mL

## 2023-12-04 MED ORDER — ASPIRIN 81 MG PO TBEC
81.0000 mg | DELAYED_RELEASE_TABLET | Freq: Every day | ORAL | Status: DC
  Administered 2023-12-05 – 2023-12-10 (×6): 81 mg via ORAL
  Filled 2023-12-04 (×6): qty 1

## 2023-12-04 MED ORDER — SODIUM CHLORIDE 0.9 % IV SOLN
250.0000 mL | INTRAVENOUS | Status: AC | PRN
Start: 2023-12-05 — End: 2023-12-06

## 2023-12-04 MED ORDER — FENTANYL CITRATE (PF) 100 MCG/2ML IJ SOLN
INTRAMUSCULAR | Status: AC
  Filled 2023-12-04: qty 2

## 2023-12-04 MED ORDER — SODIUM CHLORIDE 0.9% FLUSH
3.0000 mL | INTRAVENOUS | Status: DC | PRN
Start: 2023-12-05 — End: 2024-01-02

## 2023-12-04 MED ORDER — CEFAZOLIN SODIUM-DEXTROSE 1-4 GM/50ML-% IV SOLN: 1.0000 g | INTRAVENOUS | Status: DC

## 2023-12-04 MED ORDER — ACETAMINOPHEN 325 MG PO TABS
650.0000 mg | ORAL_TABLET | ORAL | Status: DC | PRN
  Administered 2023-12-25 – 2023-12-26 (×3): 650 mg via ORAL
  Filled 2023-12-04 (×3): qty 2

## 2023-12-04 MED ORDER — ATORVASTATIN CALCIUM 20 MG PO TABS
80.0000 mg | ORAL_TABLET | Freq: Every day | ORAL | Status: DC
  Administered 2023-12-05 – 2023-12-31 (×28): 80 mg via ORAL
  Filled 2023-12-04 (×5): qty 4
  Filled 2023-12-04: qty 1
  Filled 2023-12-04 (×4): qty 4
  Filled 2023-12-04 (×4): qty 1
  Filled 2023-12-04 (×2): qty 4
  Filled 2023-12-04: qty 1
  Filled 2023-12-04: qty 4
  Filled 2023-12-04: qty 1
  Filled 2023-12-04 (×3): qty 4
  Filled 2023-12-04: qty 1
  Filled 2023-12-04 (×2): qty 4
  Filled 2023-12-04: qty 1
  Filled 2023-12-04: qty 4

## 2023-12-04 MED ORDER — IODIXANOL 320 MG/ML IV SOLN
INTRAVENOUS | Status: DC | PRN
Start: 2023-12-04 — End: 2023-12-04
  Administered 2023-12-04: 35 mL

## 2023-12-04 MED ORDER — INSULIN ASPART 100 UNIT/ML IJ SOLN: 0.0000 [IU] | Freq: Every day | INTRAMUSCULAR | Status: DC

## 2023-12-04 MED ORDER — DEXTROSE 50 % IV SOLN
0.0000 mL | INTRAVENOUS | Status: DC | PRN
  Filled 2023-12-04: qty 50

## 2023-12-04 MED ORDER — MIDAZOLAM HCL 2 MG/2ML IJ SOLN
INTRAMUSCULAR | Status: DC | PRN
Start: 2023-12-04 — End: 2023-12-04
  Administered 2023-12-04 (×2): 1 mg via INTRAVENOUS
  Administered 2023-12-04: 2 mg via INTRAVENOUS

## 2023-12-04 MED ORDER — FENTANYL CITRATE (PF) 100 MCG/2ML IJ SOLN
INTRAMUSCULAR | Status: AC
Start: 2023-12-04 — End: 2023-12-04
  Filled 2023-12-04: qty 2

## 2023-12-04 MED ORDER — SODIUM CHLORIDE 0.9 % IV SOLN: INTRAVENOUS | Status: DC

## 2023-12-04 MED ORDER — FENTANYL CITRATE (PF) 100 MCG/2ML IJ SOLN
INTRAMUSCULAR | Status: DC | PRN
  Administered 2023-12-04: 25 ug via INTRAVENOUS
  Administered 2023-12-04: 50 ug via INTRAVENOUS

## 2023-12-04 MED ORDER — IOHEXOL 300 MG/ML  SOLN
100.0000 mL | Freq: Once | INTRAMUSCULAR | Status: AC | PRN
Start: 2023-12-04 — End: 2023-12-04
  Administered 2023-12-04: 100 mL via INTRAVENOUS

## 2023-12-04 MED ORDER — LACTATED RINGERS IV BOLUS: 1000.0000 mL | Freq: Once | INTRAVENOUS | Status: DC

## 2023-12-04 MED ORDER — MORPHINE SULFATE (PF) 4 MG/ML IV SOLN
4.0000 mg | Freq: Once | INTRAVENOUS | Status: AC
  Administered 2023-12-04: 4 mg via INTRAVENOUS
  Filled 2023-12-04: qty 1

## 2023-12-04 MED ORDER — CEFAZOLIN SODIUM-DEXTROSE 2-4 GM/100ML-% IV SOLN: 2.0000 g | Freq: Once | INTRAVENOUS | Status: DC

## 2023-12-04 MED ORDER — CEFAZOLIN SODIUM-DEXTROSE 1-4 GM/50ML-% IV SOLN
INTRAVENOUS | Status: AC | PRN
  Administered 2023-12-04: 2 g via INTRAVENOUS

## 2023-12-04 MED ORDER — SODIUM CHLORIDE 0.9 % IV BOLUS
1000.0000 mL | Freq: Once | INTRAVENOUS | Status: AC
  Administered 2023-12-04: 1000 mL via INTRAVENOUS

## 2023-12-04 MED ORDER — INSULIN REGULAR(HUMAN) IN NACL 100-0.9 UT/100ML-% IV SOLN: INTRAVENOUS | Status: DC

## 2023-12-04 MED ORDER — DEXTROSE-SODIUM CHLORIDE 5-0.45 % IV SOLN: INTRAVENOUS | Status: DC

## 2023-12-04 MED ORDER — SODIUM CHLORIDE 0.9 % IV SOLN: INTRAVENOUS | Status: AC

## 2023-12-04 MED ORDER — SODIUM CHLORIDE 0.9% FLUSH
3.0000 mL | Freq: Two times a day (BID) | INTRAVENOUS | Status: DC
  Administered 2023-12-05 – 2024-01-01 (×51): 3 mL via INTRAVENOUS

## 2023-12-04 MED ORDER — MIDAZOLAM HCL 5 MG/5ML IJ SOLN
INTRAMUSCULAR | Status: AC
  Filled 2023-12-04: qty 5

## 2023-12-04 MED ORDER — HEPARIN (PORCINE) IN NACL 1000-0.9 UT/500ML-% IV SOLN
INTRAVENOUS | Status: DC | PRN
  Administered 2023-12-04: 1000 mL

## 2023-12-04 MED ORDER — ALBUTEROL SULFATE (2.5 MG/3ML) 0.083% IN NEBU
2.5000 mg | INHALATION_SOLUTION | RESPIRATORY_TRACT | Status: DC | PRN
  Administered 2023-12-30: 2.5 mg via RESPIRATORY_TRACT
  Filled 2023-12-04: qty 3

## 2023-12-04 MED ORDER — LACTATED RINGERS IV SOLN: INTRAVENOUS | Status: DC

## 2023-12-04 MED ORDER — INSULIN ASPART 100 UNIT/ML IJ SOLN
14.0000 [IU] | Freq: Once | INTRAMUSCULAR | Status: AC
  Administered 2023-12-04: 14 [IU] via SUBCUTANEOUS
  Filled 2023-12-04: qty 1

## 2023-12-04 MED ORDER — HEPARIN (PORCINE) 25000 UT/250ML-% IV SOLN
2050.0000 [IU]/h | INTRAVENOUS | Status: DC
  Administered 2023-12-04: 1000 [IU]/h via INTRAVENOUS
  Administered 2023-12-05 – 2023-12-06 (×2): 1600 [IU]/h via INTRAVENOUS
  Administered 2023-12-07: 1900 [IU]/h via INTRAVENOUS
  Administered 2023-12-07 – 2023-12-11 (×9): 2050 [IU]/h via INTRAVENOUS
  Filled 2023-12-04 (×13): qty 250

## 2023-12-04 MED ORDER — HEPARIN SODIUM (PORCINE) 1000 UNIT/ML IJ SOLN
INTRAMUSCULAR | Status: DC | PRN
  Administered 2023-12-04: 6000 [IU] via INTRAVENOUS

## 2023-12-04 MED ORDER — INSULIN ASPART 100 UNIT/ML IJ SOLN: 0.0000 [IU] | Freq: Three times a day (TID) | INTRAMUSCULAR | Status: DC

## 2023-12-04 MED ORDER — CHLORHEXIDINE GLUCONATE CLOTH 2 % EX PADS
6.0000 | MEDICATED_PAD | Freq: Every day | CUTANEOUS | Status: DC
  Administered 2023-12-05 – 2023-12-18 (×12): 6 via TOPICAL

## 2023-12-04 MED ORDER — MORPHINE SULFATE (PF) 2 MG/ML IV SOLN
2.0000 mg | INTRAVENOUS | Status: DC | PRN
  Administered 2023-12-04 – 2023-12-12 (×10): 2 mg via INTRAVENOUS
  Filled 2023-12-04 (×10): qty 1

## 2023-12-04 MED ORDER — CARVEDILOL 12.5 MG PO TABS: 25.0000 mg | ORAL_TABLET | Freq: Two times a day (BID) | ORAL | Status: DC

## 2023-12-04 MED ORDER — HEPARIN SODIUM (PORCINE) 1000 UNIT/ML IJ SOLN
INTRAMUSCULAR | Status: AC
  Filled 2023-12-04: qty 10

## 2023-12-04 MED ORDER — INSULIN GLARGINE-YFGN 100 UNIT/ML ~~LOC~~ SOLN
14.0000 [IU] | Freq: Every day | SUBCUTANEOUS | Status: DC
  Administered 2023-12-05 – 2023-12-16 (×11): 14 [IU] via SUBCUTANEOUS
  Filled 2023-12-04 (×15): qty 0.14

## 2023-12-04 MED ORDER — ONDANSETRON HCL 4 MG/2ML IJ SOLN
4.0000 mg | Freq: Four times a day (QID) | INTRAMUSCULAR | Status: DC | PRN
  Administered 2023-12-04 – 2024-01-01 (×8): 4 mg via INTRAVENOUS
  Filled 2023-12-04 (×8): qty 2

## 2023-12-04 SURGICAL SUPPLY — 22 items
BALLOON ULTRVRSE 6X80X75 (BALLOONS) IMPLANT
BALLOON ULTRVRSE 8X60X75C (BALLOONS) IMPLANT
CATH ANGIO 5F PIGTAIL 65CM (CATHETERS) IMPLANT
CATH BEACON 5 .035 65 KMP TIP (CATHETERS) IMPLANT
CATH TEMPO 5F RIM 65CM (CATHETERS) IMPLANT
DEVICE PRESTO INFLATION (MISCELLANEOUS) IMPLANT
DEVICE VASC CLSR CELT ART 7 (Vascular Products) IMPLANT
GLIDEWIRE ADV .035X260CM (WIRE) IMPLANT
GOWN STRL REUS W/ TWL LRG LVL3 (GOWN DISPOSABLE) ×1 IMPLANT
GUIDEWIRE SUPER STIFF .035X180 (WIRE) IMPLANT
KIT MICROPUNCTURE VSI 5F STIFF (SHEATH) IMPLANT
NDL ENTRY 21GA 7CM ECHOTIP (NEEDLE) IMPLANT
NEEDLE ENTRY 21GA 7CM ECHOTIP (NEEDLE) ×1 IMPLANT
PACK ANGIOGRAPHY (CUSTOM PROCEDURE TRAY) ×1 IMPLANT
SET INTRO CAPELLA COAXIAL (SET/KITS/TRAYS/PACK) IMPLANT
SHEATH BRITE TIP 6FRX11 (SHEATH) IMPLANT
SHEATH BRITE TIP 7FRX11 (SHEATH) IMPLANT
SHEATH FLEXOR ANSEL2 7FRX45 (SHEATH) IMPLANT
STENT VIABAHN 6X50X120 (Permanent Stent) IMPLANT
STENT VIABAHN 8X7.5X120 (Permanent Stent) IMPLANT
WIRE G V18X300CM (WIRE) IMPLANT
WIRE J 3MM .035X145CM (WIRE) IMPLANT

## 2023-12-04 NOTE — ED Provider Notes (Signed)
 [facility] Provider Note    Event Date/Time   First MD Initiated Contact with Patient 12/04/23 1503     (approximate)   History   Chief Complaint Weakness   HPI  Manuel [name] is a 78 y.o. male with past medical history of hypertension, hyperlipidemia, diabetes, CAD, PAD, and right BKA who presents to the ED complaining of weakness.  Patient reports that he was attempting to transition from his bed to wheelchair this morning when he slipped and fell, ended up with the wheelchair on top of him.  He hit his head but denies losing consciousness, does take Plavix .  He spent about 2 hours on the ground before he was able to contact his wife, who then contacted EMS to help him up.  He refused transport to the hospital at that time, but felt increasingly unwell over the course of the day with pain in both sides of his groin.  Wife describes intermittent episodes of diffuse shaking and EMS was then contacted to bring him to the hospital.  He denies any fevers, cough, chest pain, shortness of breath, or dysuria.  He does state that he had food poisoning earlier in the week with vomiting and diarrhea, but this has since resolved.     Physical Exam   Triage Vital Signs: ED Triage Vitals  Encounter Vitals Group     BP 12/04/23 1135 131/72     Girls Systolic BP Percentile --      Girls Diastolic BP Percentile --      Boys Systolic BP Percentile --      Boys Diastolic BP Percentile --      Pulse Rate 12/04/23 1135 (!) 113     Resp 12/04/23 1135 19     Temp 12/04/23 1135 99.3 F (37.4 C)     Temp Source 12/04/23 1135 Oral     SpO2 12/04/23 1135 95 %     Weight 12/04/23 1138 180 lb (81.6 kg)     Height 12/04/23 1138 5' 8 (1.727 m)     Head Circumference --      Peak Flow --      Pain Score 12/04/23 1138 6     Pain Loc --      Pain Education --      Exclude from Growth Chart --     Most recent vital signs: Vitals:   12/04/23 2230 12/05/23 0000  BP:  108/67 (!) 93/56  Pulse: (!) 103 96  Resp: (!) 24 (!) 21  Temp:    SpO2: 96% 95%    Constitutional: Alert and oriented. Eyes: Conjunctivae are normal. Head: Atraumatic. Nose: No congestion/rhinnorhea. Mouth/Throat: Mucous membranes are moist.  Neck: No midline cervical spine tenderness to palpation. Cardiovascular: Normal rate, regular rhythm. Grossly normal heart sounds.  2+ radial pulses bilaterally. Respiratory: Normal respiratory effort.  No retractions. Lungs CTAB. Gastrointestinal: Soft and tender to palpation in the right lower quadrant with no rebound or guarding. No distention. Musculoskeletal: No lower extremity tenderness nor edema.  Neurologic:  Normal speech and language. No gross focal neurologic deficits are appreciated.    ED Results / Procedures / Treatments   Labs (all labs ordered are listed, but only abnormal results are displayed) Labs Reviewed  LACTIC ACID, PLASMA - Abnormal; Notable for the following components:      Result Value   Lactic Acid, Venous 2.9 (*)    All other components within normal limits  LACTIC ACID, PLASMA - Abnormal; Notable  for the following components:   Lactic Acid, Venous 3.3 (*)    All other components within normal limits  COMPREHENSIVE METABOLIC PANEL WITH GFR - Abnormal; Notable for the following components:   Sodium 126 (*)    Potassium 5.3 (*)    Chloride 91 (*)    Glucose, Bld 438 (*)    Total Bilirubin 1.7 (*)    All other components within normal limits  CBC WITH DIFFERENTIAL/PLATELET - Abnormal; Notable for the following components:   WBC 11.6 (*)    Neutro Abs 10.5 (*)    Lymphs Abs 0.5 (*)    All other components within normal limits  URINALYSIS, W/ REFLEX TO CULTURE (INFECTION SUSPECTED) - Abnormal; Notable for the following components:   Color, Urine YELLOW (*)    APPearance CLEAR (*)    Glucose, UA >=500 (*)    Hgb urine dipstick SMALL (*)    Ketones, ur 20 (*)    Protein, ur >=300 (*)    Bacteria, UA  RARE (*)    All other components within normal limits  APTT - Abnormal; Notable for the following components:   aPTT 55 (*)    All other components within normal limits  PROTIME-INR - Abnormal; Notable for the following components:   Prothrombin Time 17.0 (*)    INR 1.3 (*)    All other components within normal limits  CBC - Abnormal; Notable for the following components:   WBC 11.0 (*)    HCT 38.3 (*)    All other components within normal limits  COMPREHENSIVE METABOLIC PANEL WITH GFR - Abnormal; Notable for the following components:   Sodium 124 (*)    Potassium 5.2 (*)    Chloride 93 (*)    CO2 19 (*)    Glucose, Bld 395 (*)    Calcium  8.6 (*)    Albumin 3.1 (*)    AST 44 (*)    Total Bilirubin 2.0 (*)    All other components within normal limits  GLUCOSE, CAPILLARY - Abnormal; Notable for the following components:   Glucose-Capillary 431 (*)    All other components within normal limits  LACTIC ACID, PLASMA - Abnormal; Notable for the following components:   Lactic Acid, Venous 2.7 (*)    All other components within normal limits  BASIC METABOLIC PANEL WITH GFR - Abnormal; Notable for the following components:   Sodium 125 (*)    Potassium 5.3 (*)    Chloride 94 (*)    CO2 18 (*)    Glucose, Bld 394 (*)    Calcium  8.4 (*)    All other components within normal limits  BRAIN NATRIURETIC PEPTIDE - Abnormal; Notable for the following components:   B Natriuretic Peptide 3,374.0 (*)    All other components within normal limits  BLOOD GAS, VENOUS - Abnormal; Notable for the following components:   pCO2, Ven 37 (*)    All other components within normal limits  CBG MONITORING, ED - Abnormal; Notable for the following components:   Glucose-Capillary 411 (*)    All other components within normal limits  TROPONIN I (HIGH SENSITIVITY) - Abnormal; Notable for the following components:   Troponin I (High Sensitivity) 715 (*)    All other components within normal limits  TROPONIN  I (HIGH SENSITIVITY) - Abnormal; Notable for the following components:   Troponin I (High Sensitivity) 6,596 (*)    All other components within normal limits  TROPONIN I (HIGH SENSITIVITY) - Abnormal; Notable for the  following components:   Troponin I (High Sensitivity) 8,119 (*)    All other components within normal limits  RESP PANEL BY RT-PCR (RSV, FLU A&B, COVID)  RVPGX2  MRSA NEXT GEN BY PCR, NASAL  CULTURE, BLOOD (ROUTINE X 2)  CULTURE, BLOOD (ROUTINE X 2)  CK  CBC  COMPREHENSIVE METABOLIC PANEL WITH GFR  HEPARIN  LEVEL (UNFRACTIONATED)  LACTIC ACID, PLASMA  BASIC METABOLIC PANEL WITH GFR  BASIC METABOLIC PANEL WITH GFR  BASIC METABOLIC PANEL WITH GFR  BASIC METABOLIC PANEL WITH GFR  LEGIONELLA PNEUMOPHILA SEROGP 1 UR AG  STREP PNEUMONIAE URINARY ANTIGEN  PROCALCITONIN  TYPE AND SCREEN  TROPONIN I (HIGH SENSITIVITY)     EKG  ED ECG REPORT I, Carlin Palin, the attending physician, personally viewed and interpreted this ECG.   Date: 12/04/2023  EKG Time: 11:43  Rate: 113  Rhythm: sinus tachycardia  Axis: RAD  Intervals:none  ST&T Change: Inferolateral T wave inversions  RADIOLOGY Chest x-ray reviewed and interpreted by me with no infiltrate, edema, or effusion.  PROCEDURES:  Critical Care performed: No  Procedures   MEDICATIONS ORDERED IN ED: Medications  oxyCODONE  (Oxy IR/ROXICODONE ) immediate release tablet 5-10 mg (5 mg Oral Given 12/05/23 0006)  morphine  (PF) 2 MG/ML injection 2 mg (2 mg Intravenous Given 12/04/23 2203)  acetaminophen  (TYLENOL ) tablet 650 mg (has no administration in time range)  ondansetron  (ZOFRAN ) injection 4 mg (4 mg Intravenous Given 12/04/23 2120)  sodium chloride  flush (NS) 0.9 % injection 3 mL (3 mLs Intravenous Given 12/05/23 0004)  sodium chloride  flush (NS) 0.9 % injection 3 mL (has no administration in time range)  0.9 %  sodium chloride  infusion (has no administration in time range)  heparin  ADULT infusion 100 units/mL  (25000 units/250mL) (1,000 Units/hr Intravenous New Bag/Given 12/04/23 2227)  albuterol  (PROVENTIL ) (2.5 MG/3ML) 0.083% nebulizer solution 2.5 mg (has no administration in time range)  Chlorhexidine  Gluconate Cloth 2 % PADS 6 each (has no administration in time range)  dextrose  50 % solution 0-50 mL (has no administration in time range)  dextrose  5 % and 0.45 % NaCl infusion (0 mLs Intravenous Hold 12/04/23 2359)  0.9 %  sodium chloride  infusion ( Intravenous New Bag/Given 12/05/23 0004)  insulin  glargine-yfgn (SEMGLEE ) injection 14 Units (14 Units Subcutaneous Given 12/05/23 0004)  aspirin  EC tablet 81 mg (has no administration in time range)  atorvastatin  (LIPITOR ) tablet 80 mg (80 mg Oral Given 12/05/23 0005)  carvedilol  (COREG ) tablet 25 mg (has no administration in time range)  azithromycin  (ZITHROMAX ) 500 mg in sodium chloride  0.9 % 250 mL IVPB (has no administration in time range)  LORazepam  (ATIVAN ) tablet 1-4 mg (has no administration in time range)    Or  LORazepam  (ATIVAN ) injection 1-4 mg (has no administration in time range)  thiamine  (VITAMIN B1) tablet 100 mg (has no administration in time range)    Or  thiamine  (VITAMIN B1) injection 100 mg (has no administration in time range)  folic acid  (FOLVITE ) tablet 1 mg (has no administration in time range)  multivitamin with minerals tablet 1 tablet (has no administration in time range)  sodium chloride  0.9 % bolus 1,000 mL (1,000 mLs Intravenous New Bag/Given 12/04/23 1609)  morphine  (PF) 4 MG/ML injection 4 mg (4 mg Intravenous Given 12/04/23 1610)  iohexol  (OMNIPAQUE ) 300 MG/ML solution 100 mL (100 mLs Intravenous Contrast Given 12/04/23 1626)  ceFAZolin  (ANCEF ) IVPB 1 g/50 mL premix (2 g Intravenous New Bag/Given 12/04/23 1911)  insulin  aspart (novoLOG ) injection 14 Units (14 Units Subcutaneous  Given 12/04/23 2324)     IMPRESSION / MDM / ASSESSMENT AND PLAN / ED COURSE  I reviewed the triage vital signs and the nursing notes.                               78 y.o. male with past medical history of hypertension, hyperlipidemia, diabetes, CAD, and PAD status post right BKA who presents to the ED complaining of fall this morning, has been feeling generally and weak since then.  Patient's presentation is most consistent with acute presentation with potential threat to life or bodily function.  Differential diagnosis includes, but is not limited to, sepsis, UTI, pneumonia, intracranial injury, cervical spine injury, extremity injury, anemia, electrolyte abnormality, AKI, viral syndrome.  Patient nontoxic-appearing and in no acute distress, vital signs remarkable for tachycardia but otherwise reassuring.  Sepsis workup initiated due to temp of 99.3 and tachycardia, but patient reports no obvious symptoms of infection other than generalized weakness.  Chest x-ray and urinalysis are unremarkable, will check CT of his chest/abdomen/pelvis for possible source of sepsis.  Also plan to check CT head and cervical spine given trauma, but no evidence of traumatic injury to his extremities.  Labs with WC of 11.6, no significant anemia or AKI noted.  Patient noted to be hyperglycemic with no evidence of DKA, sodium reported at 126 but this corrects to within normal limits with adjustment for his hyperglycemia.  Lactic acid elevated to 2.9, will hydrate with IV fluids and recheck.  CT imaging with no clear source of sepsis but shows large hematoma at the right common femoral artery with active extravasation.  Case discussed with Dr. [name] of vascular surgery, who will take patient to the Cath Lab to address bleeding.  He remains hemodynamically stable at this time but troponin noted to be significantly elevated at 700.  Patient continues to deny any chest pain or shortness of breath, does have inferior T wave inversions but do not feel we can initiate heparin  with active bleeding at this time.  Case discussed with hospitalist for admission following  vascular procedure.      FINAL CLINICAL IMPRESSION(S) / ED DIAGNOSES   Final diagnoses:  Generalized weakness  Fall, initial encounter  Closed injury of common femoral artery     Rx / DC Orders   ED Discharge Orders     None        Note:  This document was prepared using Dragon voice recognition software and may include unintentional dictation errors.   Willo Dunnings, MD 12/05/23 [phone]

## 2023-12-04 NOTE — H&P (Addendum)
 History and Physical    Manuel [name] FMW:[id number] DOB: November 24, 1945 DOA: 12/04/2023  PCP: [name], [name], MD  Patient coming from: home  I have personally briefly reviewed patient's old medical records in [facility]  Chief Complaint: fall with injury   HPI: Manuel [name] is a 78 y.o. 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 Patient fell with walker landing on top of him.  EMS was called for assistance.  Patient refused to be transported to ED for evaluation initial. However as time when on patient note more intense pain in right groin area and due to his EMS was called back and patient was transported to ED.    ED Course:  99.3/tmx 100.7 BP 131/72, hr 113, rr 19 sat 95%  UA :rare bacteria , ketones 20 ,  Wbc 11.6, hgb 14.3, plt 191, increase pmn Na 126 ( 137), K 5.3, cl91, glu 438, cr 1.12 Lactic 2.9 ,3.3 Trop 284,3403, 8119 EKG: sinus tachycardia , RAD twave change inferior lateral leads Cxr:   IMPRESSION: 1. No acute cardiopulmonary findings. 2. Stable cardiomegaly. 3.  Aortic Atherosclerosis (ICD10-I70.0).  RVP neg  CTH/Ctspine: NAD  Ctab/pelvis IMPRESSION: 1. Large hematoma surrounding the right common femoral artery with active arterial extravasation of contrast into the hematoma. 2. 3.2 cm infrarenal abdominal aortic aneurysm. Recommend follow-up every 3 years. 3. Mild patchy peripheral ground-glass opacities in the anterior right upper lobe with some focal pleural thickening. Findings are nonspecific and may be infectious/inflammatory. 4. Gallbladder wall edema. No radiopaque calculi are seen. Correlate clinically for cholecystitis. 5.  Nonobstructing 3 mm calculus in the distal right ureter. 6. Colonic diverticulosis. 7. Fatty infiltration of the liver. 8. Mild splenomegaly.  Right shoulder IMPRESSION: Chronic and degenerative changes, as described above, without an acute osseous abnormality. Tx 1L , morphine  4mg   Patient on evaluation found to have right femoral a rupture, vascular consulted and performed repair.    Patient course was further complicated by NTSEMI , s/p right femoral a repair patient was started on heparin  drip . Cardiology was consulted and will see patient in am , but plans for intervention  recommend medical management.    Review of Systems: As per HPI otherwise 10 point review of systems negative.   Past Medical History:  Diagnosis Date   Acute ST elevation myocardial infarction (STEMI) of inferior wall (HCC) 04/19/2010   a.) transfered [facility] --> LHC/PCI (very difficult procedure) --> 3.0 x 23 mm and 3.0 x 12 mm Xience stents to RCA   Allergies    Arthritis    Benign essential hypertension    Bilateral carotid artery disease (HCC) 05/08/2021   a.) carotid doppler 05/08/2021: 1-39% BICA   CAD (coronary artery disease) 04/19/2010   a.) inferior STEMI 04/19/2010 --> LHC/PCI: 50-70% pD1, 80% pRI, 90/90/90% RCA (overlapping 3.0 x 23 and 3.0 x 12 mm Xience DES); b.) MV 11/10/2018: fixed minimally reversible inferior perfusion defect   Cellulitis of foot    Chronic HFrEF (heart failure with reduced ejection fraction) (HCC)    DDD (degenerative disc disease), cervical    Diabetes mellitus type 2, insulin  dependent (  HCC)    Diverticulosis    Full dentures    Gout    Hard of hearing    History of bilateral cataract extraction 2022   History of ETOH abuse    Hyperlipidemia    Ischemic cardiomyopathy 04/19/2010   a.) TTE 04/19/2010: 40%; b.) TTE 04/20/2014: EF >55%; c.) TTE 11/10/2018: EF 45%; d.) TTE 11/14/2022: EF 25-30%; e. 06/2023 Echo: EF 25-30%, nl RV size/fxn. Mild MR. Mild-mod  TR. Ao sclerosis w/o stenosis.   Long term current use of aspirin     Long term current use of clopidogrel     Lumbar degenerative disc disease    Lumbar radiculopathy    Lumbar vertebral fracture (chronic superior endplate of L1)    NSTEMI (non-ST elevated myocardial infarction) (HCC) 01/15/2023   OSA (obstructive sleep apnea)    a.) unable to tolerate nocturnal PAP therapy   Peripheral artery disease (HCC)    a.) stenting 05/14/21: 12 mm x 12 cm LifeStent RIGHT dis SFA/prox pop; b.) s/p cath directed thrombolysis RIGHT SFA/pop 06/14/21; c.) s/p mech thrombectomy + stenting 06/15/21: 8 mm x 25cm & 8 mm x 7.5cm Viabahn; d.) s/p BILAT CFA, profunda femoris, SFA endarterectomies + fogarty embolectomy + stenting 11/15/22: 12mm x 58mm Lifestream BILAT CIAs, 14 mm x 6 cm Lifestream & 13 mm x 5 cm Viabahn LEFT EIA   Peripheral neuropathy    Umbilical hernia     Past Surgical History:  Procedure Laterality Date   AMPUTATION Right 11/18/2022   Procedure: AMPUTATION 4TH AND 5TH RAY;  Surgeon: [name], [name];  Location: [facility];  Service: Orthopedics/Podiatry;  Laterality: Right;  4th and 5th toe   AMPUTATION Right 04/20/2023   Procedure: AMPUTATION BELOW KNEE;  Surgeon: [name], MD;  Location: [facility];  Service: Vascular;  Laterality: Right;  block then general   APPLICATION OF WOUND VAC Right 01/09/2023   Procedure: APPLICATION OF WOUND VAC;  Surgeon: [name], MD;  Location: [facility];  Service: Vascular;  Laterality: Right;   CATARACT EXTRACTION W/PHACO Right 03/14/2021   Procedure: CATARACT EXTRACTION PHACO AND INTRAOCULAR LENS PLACEMENT (IOC) RIGHT DIABETIC;  Surgeon: [name], MD;  Location: [facility];  Service: Ophthalmology;  Laterality: Right;  Diabetic 16.78 01:39.9   CATARACT EXTRACTION W/PHACO Left 03/28/2021   Procedure: CATARACT EXTRACTION PHACO AND INTRAOCULAR LENS PLACEMENT (IOC) LEFT DIABETIC 6.93 01:22.0;  Surgeon: [name], MD;   Location: [facility];  Service: Ophthalmology;  Laterality: Left;  Diabetic   COLONOSCOPY     CORONARY ANGIOPLASTY WITH STENT PLACEMENT  03/2010   Procedure: CORONARY ANGIOPLASTY WITH STENT PLACEMENT; Location: [name]   CORONARY STENT INTERVENTION N/A 07/14/2023   Procedure: CORONARY STENT INTERVENTION;  Surgeon: [name], MD;  Location: [facility];  Service: Cardiovascular;  Laterality: N/A;   ENDARTERECTOMY FEMORAL Bilateral 11/15/2022   Procedure: BILATERAL COMMON FEMORAL PROFUNDA FEMORIS AND SUPERFICIAL FEMORAL ARTERY ENDARTECTOMIES, RIGHT FOGARTY EMBOLECTOMY OF THE RIGHT SFA  AND  POPLITEAL ARTERIES. AORTAGRAM AND RIGHT LOWER EXTREMITY ANGIOGRAM.;  Surgeon: [name], MD;  Location: [facility];  Service: Vascular;  Laterality: Bilateral;   INSERTION OF ILIAC STENT Bilateral 11/15/2022   Procedure: BILATERAL STENT INSERTION IN BILATERAL  COMMON ILIAC ARTERY, STENT INSERTION OF LEFT EXTERNAL ILIAC ARTERY. ANGIOPLASTY RIGHT TIBIAL  AND POPLITEAL ARTERY.;  Surgeon: [name], MD;  Location: [facility];  Service: Vascular;  Laterality: Bilateral;   IRRIGATION AND DEBRIDEMENT KNEE Right 04/21/2023   Procedure: IRRIGATION AND DEBRIDEMENT KNEE;  Surgeon: Zafonte, Brian Jenne Sellinger, MD;  Location: [facility];  Service: Orthopedics;  Laterality: Right;   LOWER EXTREMITY ANGIOGRAPHY Right 05/14/2021   Procedure: LOWER EXTREMITY ANGIOGRAPHY;  Surgeon: [name], MD;  Location: [facility];  Service: Cardiovascular;  Laterality: Right;   LOWER EXTREMITY ANGIOGRAPHY Right 06/14/2021   Procedure: Lower Extremity Angiography;  Surgeon: [name], MD;  Location: [facility];  Service: Cardiovascular;  Laterality: Right;   LOWER EXTREMITY ANGIOGRAPHY Right 11/11/2022   Procedure: Lower Extremity Angiography;  Surgeon: [name], MD;  Location: [facility];  Service: Cardiovascular;  Laterality: Right;   LOWER EXTREMITY INTERVENTION Right 06/15/2021    Procedure: LOWER EXTREMITY INTERVENTION;  Surgeon: [name], MD;  Location: [facility];  Service: Cardiovascular;  Laterality: Right;   RIGHT/LEFT HEART CATH AND CORONARY ANGIOGRAPHY N/A 07/14/2023   Procedure: RIGHT/LEFT HEART CATH AND CORONARY ANGIOGRAPHY;  Surgeon: [name], MD;  Location: [facility];  Service: Cardiovascular;  Laterality: N/A;   TONSILLECTOMY     TRANSMETATARSAL AMPUTATION Right 02/17/2023   Procedure: TRANSMETATARSAL AMPUTATION;  Surgeon: Lennie Barter, [name];  Location: [facility];  Service: Orthopedics/Podiatry;  Laterality: Right;   WOUND DEBRIDEMENT Right 01/09/2023   Procedure: DEBRIDEMENT WOUND;  Surgeon: [name], MD;  Location: [facility];  Service: Vascular;  Laterality: Right;     reports that he has never smoked. He has never been exposed to tobacco smoke. He has never used smokeless tobacco. He reports current alcohol  use of about 21.0 standard drinks of alcohol  per week. He reports that he does not use drugs.  No Known Allergies  Family History  Problem Relation Age of Onset   Scoliosis Mother    Heart disease Father     Prior to Admission medications   Medication Sig Start Date End Date Taking? Authorizing Provider  aspirin  EC 81 MG tablet Take 1 tablet (81 mg total) by mouth daily. 11/26/22   Maree Hue, MD  atorvastatin  (LIPITOR ) 80 MG tablet Take 1 tablet (80 mg total) by mouth at bedtime. 11/18/23   Gollan, Timothy J, MD  carvedilol  (COREG ) 25 MG tablet Take 1 tablet (25 mg total) by mouth 2 (two) times daily with a meal. 11/18/23   Gollan, Evalene PARAS, MD  clopidogrel  (PLAVIX ) 75 MG tablet Take 1 tablet (75 mg total) by mouth daily. 11/18/23   Gollan, Timothy J, MD  dapagliflozin  propanediol (FARXIGA ) 10 MG TABS tablet Take 1 tablet (10 mg total) by mouth daily. 11/18/23   Gollan, Timothy J, MD  Ensure Max Protein (ENSURE MAX PROTEIN) LIQD Take 330 mLs (11 oz total) by mouth 2 (two) times daily. Patient taking differently:  Take 11 oz by mouth daily. 11/26/22   Maree Hue, MD  escitalopram  (LEXAPRO ) 10 MG tablet Take 1 tablet (10 mg total) by mouth daily. Patient taking differently: Take 20 mg by mouth daily. 10/30/23   [name], [name], MD  ferrous sulfate 325 (65 FE) MG EC tablet Take 325 mg by mouth daily with breakfast.    [provider]  folic acid  (FOLVITE ) 1 MG tablet Take 1 tablet (1 mg total) by mouth daily. 09/12/23   [name], [name], MD  furosemide  (LASIX ) 40 MG tablet Take 1 tablet (40 mg total) by mouth daily. 11/18/23   Gollan, Timothy J, MD  gabapentin  (NEURONTIN ) 100 MG capsule 1 po at bedtime. 10/30/23   [name], [name], MD  insulin  aspart protamine - aspart (NOVOLOG  MIX  70/30) (70-30) 100 UNIT/ML injection Inject 30 Units into the skin 2 (two) times daily with a meal.    [provider]  insulin  glargine (LANTUS  SOLOSTAR) 100 UNIT/ML Solostar Pen Inject 10 Units into the skin daily. Patient not taking: Reported on 11/18/2023 07/28/23   [name], [name], MD  methocarbamol  (ROBAXIN ) 500 MG tablet Take 500 mg by mouth every 8 (eight) hours as needed for muscle spasms.    [provider]  metolazone  (ZAROXOLYN ) 2.5 MG tablet Take 1 tablet (2.5 mg total) by mouth daily for 3 days. Take 30 minutes before your lasix . Patient not taking: Reported on 11/18/2023 07/21/23 11/18/23  Donette Ellouise LABOR, FNP  nitroGLYCERIN  (NITROSTAT ) 0.4 MG SL tablet Place 1 tablet (0.4 mg total) under the tongue every 5 (five) minutes as needed for chest pain. Max: 3 tablets per day 07/16/23 07/15/24  Dorinda Drue DASEN, MD  ondansetron  (ZOFRAN ) 4 MG tablet Take 1 tablet (4 mg total) by mouth every 6 (six) hours as needed for nausea. 05/08/23   Laurence Locus, DO  polyethylene glycol (MIRALAX  / GLYCOLAX ) 17 g packet Take 17 g by mouth daily as needed for mild constipation. 02/19/23   Caleen Qualia, MD  sacubitril -valsartan  (ENTRESTO ) 24-26 MG Take 1 tablet by mouth every 12 (twelve) hours. 11/18/23 11/17/24  Gollan, Timothy J, MD   senna-docusate (SENOKOT-S) 8.6-50 MG tablet Take 1 tablet by mouth at bedtime as needed for mild constipation. 06/17/21   Jillian Buttery, MD  spironolactone  (ALDACTONE ) 25 MG tablet Take 1 tablet (25 mg total) by mouth daily. 11/18/23   Gollan, Timothy J, MD  thiamine  (VITAMIN B-1) 100 MG tablet Take 1 tablet (100 mg total) by mouth daily. 02/20/23   Amin, Sumayya, MD  traZODone  (DESYREL ) 150 MG tablet Take 75 mg by mouth at bedtime. Patient not taking: Reported on 11/18/2023    [provider]    Physical Exam: Vitals:   12/04/23 2045 12/04/23 2100 12/04/23 2115 12/04/23 2126  BP: (!) 168/90  (!) 159/89 127/61  Pulse:    (!) 107  Resp: (!) 22  (!) 28 (!) 32  Temp: 99 F (37.2 C)   99 F (37.2 C)  TempSrc: Oral   Oral  SpO2: 98% 100% 98% 97%  Weight:      Height:        Constitutional: NAD, calm,diaphoretic Vitals:   12/04/23 2045 12/04/23 2100 12/04/23 2115 12/04/23 2126  BP: (!) 168/90  (!) 159/89 127/61  Pulse:    (!) 107  Resp: (!) 22  (!) 28 (!) 32  Temp: 99 F (37.2 C)   99 F (37.2 C)  TempSrc: Oral   Oral  SpO2: 98% 100% 98% 97%  Weight:      Height:       Eyes: pupils equal , lids and conjunctivae normal ENMT: Mucous membranes are moist. Posterior pharynx clear of any exudate or lesions.Normal dentition.  Neck: normal, supple, no masses, no thyromegaly Respiratory: clear to auscultation bilaterally, no wheezing, no crackles. Normal respiratory effort. No accessory muscle use.  Cardiovascular: Regular rate and rhythm, no murmurs / rubs / gallops. No extremity edema.  Abdomen: no tenderness, no masses palpated. No hepatosplenomegaly. Bowel sounds positive.  Musculoskeletal: no clubbing / cyanosis. Right bka . Skin: no rashes, lesions, ulcers. No induration Neurologic: CN grossly intact. Sensation intact, MAE X4 Psychiatric: confused  Labs on Admission: I have personally reviewed following labs and imaging studies  CBC: Recent Labs  Lab 12/04/23 1141  12/04/23 2147  WBC 11.6* 11.0*  NEUTROABS 10.5*  --   HGB 14.3 13.3  HCT 42.9 38.3*  MCV 84.6 82.2  PLT 191 157   Basic Metabolic Panel: Recent Labs  Lab 12/04/23 1141 12/04/23 2147  NA 126* 124*  K 5.3* 5.2*  CL 91* 93*  CO2 23 19*  GLUCOSE 438* 395*  BUN 18 22  CREATININE 1.12 0.98  CALCIUM  8.9 8.6*   GFR: Estimated Creatinine Clearance: 58.2 mL/min (by C-G formula based on SCr of 0.98 mg/dL). Liver Function Tests: Recent Labs  Lab 12/04/23 1141 12/04/23 2147  AST 27 44*  ALT 15 17  ALKPHOS 90 77  BILITOT 1.7* 2.0*  PROT 6.9 6.5  ALBUMIN 3.5 3.1*   No results for input(s): LIPASE, AMYLASE in the last 168 hours. No results for input(s): AMMONIA in the last 168 hours. Coagulation Profile: Recent Labs  Lab 12/04/23 2147  INR 1.3*   Cardiac Enzymes: Recent Labs  Lab 12/04/23 1141  CKTOTAL 115   BNP (last 3 results) Recent Labs    07/21/23 1246  PROBNP 9,804*   HbA1C: No results for input(s): HGBA1C in the last 72 hours. CBG: Recent Labs  Lab 12/04/23 1824 12/04/23 2221  GLUCAP 411* 431*   Lipid Profile: No results for input(s): CHOL, HDL, LDLCALC, TRIG, CHOLHDL, LDLDIRECT in the last 72 hours. Thyroid Function Tests: No results for input(s): TSH, T4TOTAL, FREET4, T3FREE, THYROIDAB in the last 72 hours. Anemia Panel: No results for input(s): VITAMINB12, FOLATE, FERRITIN, TIBC, IRON , RETICCTPCT in the last 72 hours. Urine analysis:    Component Value Date/Time   COLORURINE YELLOW (A) 12/04/2023 1141   APPEARANCEUR CLEAR (A) 12/04/2023 1141   LABSPEC 1.023 12/04/2023 1141   PHURINE 5.0 12/04/2023 1141   GLUCOSEU >=500 (A) 12/04/2023 1141   HGBUR SMALL (A) 12/04/2023 1141   BILIRUBINUR NEGATIVE 12/04/2023 1141   KETONESUR 20 (A) 12/04/2023 1141   PROTEINUR >=300 (A) 12/04/2023 1141   NITRITE NEGATIVE 12/04/2023 1141   LEUKOCYTESUR NEGATIVE 12/04/2023 1141    Radiological Exams on  Admission: PERIPHERAL VASCULAR CATHETERIZATION Result Date: 12/04/2023 See surgical note for result.  CT CHEST ABDOMEN PELVIS W CONTRAST Result Date: 12/04/2023 CLINICAL DATA:  Sepsis.  Fall. EXAM: CT CHEST, ABDOMEN, AND PELVIS WITH CONTRAST TECHNIQUE: Multidetector CT imaging of the chest, abdomen and pelvis was performed following the standard protocol during bolus administration of intravenous contrast. RADIATION DOSE REDUCTION: This exam was performed according to the departmental dose-optimization program which includes automated exposure control, adjustment of the mA and/or kV according to patient size and/or use of iterative reconstruction technique. CONTRAST:  OMNIPAQUE  IOHEXOL  300 MG/ML  SOLN COMPARISON:  CT abdomen and pelvis 06/16/2021. FINDINGS: CT CHEST FINDINGS Cardiovascular: The heart is mildly enlarged. There is no pericardial effusion. There are atherosclerotic calcifications of the aorta and coronary arteries. Aorta is normal in size. Mediastinum/Nodes: No enlarged mediastinal, hilar, or axillary lymph nodes. Thyroid gland, trachea, and esophagus demonstrate no significant findings. Lungs/Pleura: There are mild patchy peripheral ground-glass opacities in the anterior right upper lobe with some focal pleural thickening. There are calcified granulomas bilaterally. The lungs are otherwise clear. There is no pleural effusion or pneumothorax. Musculoskeletal: There is some expansile cystic changes in the right glenoid and non expansile cystic changes in the left lung is cleared these are incompletely imaged. No other focal osseous lesions are identified. There is no acute fracture or dislocation identified. Multilevel degenerative changes affect the spine. There are healed right fourth through seventh rib fractures. CT  ABDOMEN PELVIS FINDINGS Hepatobiliary: There is diffuse fatty infiltration of the liver. There is gallbladder wall edema. No radiopaque calculi are seen. There is no biliary  ductal dilatation. Pancreas: Unremarkable. No pancreatic ductal dilatation or surrounding inflammatory changes. Spleen: Spleen is mildly enlarged. Adrenals/Urinary Tract: There is a 3 mm calculus in the distal right ureter. There is no right-sided hydronephrosis. Otherwise, the bilateral kidneys, adrenal glands and bladder are within normal limits. Stomach/Bowel: Stomach is within normal limits. Appendix appears normal. No evidence of bowel wall thickening, distention, or inflammatory changes. There is diffuse colonic diverticulosis, most significant in the sigmoid colon and descending colon. Vascular/Lymphatic: Infrarenal abdominal aortic aneurysm measures 3.2 cm similar to prior. There is severe atherosclerotic calcifications of the aorta. IVC is normal in size. There is a vascular stent in the left external iliac artery. There is hematoma surrounding the right common femoral artery measuring 6.6 x 4.9 by 7.0 cm. There is active extravasation of a large amount of contrast into the central hematoma with source at the anterior margin of the right common femoral artery. There is some scarring surrounding the left common femoral artery. No enlarged lymph nodes are identified. Reproductive: Prostate gland is mildly enlarged. Other: There is a small fat containing umbilical hernia. There is no ascites. There is no retroperitoneal hematoma. Musculoskeletal: There is severe degenerative changes throughout the spine. Chronic L1 compression fracture is unchanged. IMPRESSION: 1. Large hematoma surrounding the right common femoral artery with active arterial extravasation of contrast into the hematoma. 2. 3.2 cm infrarenal abdominal aortic aneurysm. Recommend follow-up every 3 years. 3. Mild patchy peripheral ground-glass opacities in the anterior right upper lobe with some focal pleural thickening. Findings are nonspecific and may be infectious/inflammatory. 4. Gallbladder wall edema. No radiopaque calculi are seen.  Correlate clinically for cholecystitis. 5. Nonobstructing 3 mm calculus in the distal right ureter. 6. Colonic diverticulosis. 7. Fatty infiltration of the liver. 8. Mild splenomegaly. Aortic Atherosclerosis (ICD10-I70.0). These results were called by telephone at the time of interpretation on 12/04/2023 at 5:49 pm to provider [facility] , who verbally acknowledged these results. Electronically Signed   By: Greig Pique M.D.   On: 12/04/2023 17:49   CT Head Wo Contrast Result Date: 12/04/2023 CLINICAL DATA:  Status post fall. EXAM: CT HEAD WITHOUT CONTRAST TECHNIQUE: Contiguous axial images were obtained from the base of the skull through the vertex without intravenous contrast. RADIATION DOSE REDUCTION: This exam was performed according to the departmental dose-optimization program which includes automated exposure control, adjustment of the mA and/or kV according to patient size and/or use of iterative reconstruction technique. COMPARISON:  April 09, 2021 FINDINGS: Brain: There is generalized cerebral atrophy with widening of the extra-axial spaces and ventricular dilatation. There are areas of decreased attenuation within the white matter tracts of the supratentorial brain, consistent with microvascular disease changes. Vascular: Marked severity bilateral cavernous carotid artery calcification is noted. Skull: Normal. Negative for fracture or focal lesion. Sinuses/Orbits: No acute finding. Other: None. IMPRESSION: 1. Generalized cerebral atrophy and microvascular disease changes of the supratentorial brain. 2. No acute intracranial abnormality. Electronically Signed   By: Suzen Dials M.D.   On: 12/04/2023 17:44   DG Shoulder Right Result Date: 12/04/2023 CLINICAL DATA:  Status post fall. EXAM: RIGHT SHOULDER - 2+ VIEW COMPARISON:  None Available. FINDINGS: There is no evidence of an acute fracture or dislocation. A chronic appearing deformity is seen along the inferior medial aspect of the  right humeral head and neck. Multiple chronic right-sided rib  fractures are noted. Marked severity degenerative changes are seen involving the right glenohumeral joint. Extensive chronic and degenerative changes are also present involving the right glenohumeral joint, in the form of joint space narrowing and extensive humeral and glenoid subchondral cyst formation. Soft tissues are unremarkable. IMPRESSION: Chronic and degenerative changes, as described above, without an acute osseous abnormality. Electronically Signed   By: Suzen Dials M.D.   On: 12/04/2023 17:41   CT Cervical Spine Wo Contrast Result Date: 12/04/2023 CLINICAL DATA:  Status post fall. EXAM: CT CERVICAL SPINE WITHOUT CONTRAST TECHNIQUE: Multidetector CT imaging of the cervical spine was performed without intravenous contrast. Multiplanar CT image reconstructions were also generated. RADIATION DOSE REDUCTION: This exam was performed according to the departmental dose-optimization program which includes automated exposure control, adjustment of the mA and/or kV according to patient size and/or use of iterative reconstruction technique. COMPARISON:  None Available. FINDINGS: Alignment: There is straightening of the normal cervical spine lordosis. Approximately 2 mm anterolisthesis of the C4 vertebral body is noted on C5. Skull base and vertebrae: No acute fracture. No primary bone lesion or focal pathologic process. Chronic and degenerative changes are seen involving the body and tip of the dens, as well as the adjacent portion of the anterior arch of C1. Soft tissues and spinal canal: No prevertebral fluid or swelling. No visible canal hematoma. Disc levels: Marked severity endplate sclerosis, anterior osteophyte formation and posterior bony spurring are seen at the levels of C4-C5, C5-C6, C6-C7 and C7-T1. Posterior bony spurring is also present at the level of C3-C4. There is marked severity narrowing of the anterior atlantoaxial  articulation. Marked severity intervertebral disc space narrowing is seen at C4-C5, C5-C6, C6-C7 and C7-T1. Bilateral marked severity multilevel facet joint hypertrophy is noted. Upper chest: Negative. Other: None. IMPRESSION: 1. No acute fracture or traumatic subluxation. 2. Marked severity multilevel degenerative changes, as described above. Electronically Signed   By: Suzen Dials M.D.   On: 12/04/2023 17:36   DG Chest 2 View Result Date: 12/04/2023 CLINICAL DATA:  Weakness. EXAM: DG CHEST 2V COMPARISON:  07/09/2023. FINDINGS: Stable cardiomegaly. Aortic atherosclerosis. No focal consolidation, pleural effusion, or pneumothorax. Remote healed right-sided rib fractures. Advanced degenerative changes of the bilateral glenohumeral joints. No acute osseous abnormality identified. IMPRESSION: 1. No acute cardiopulmonary findings. 2. Stable cardiomegaly. 3.  Aortic Atherosclerosis (ICD10-I70.0). Electronically Signed   By: Harrietta Sherry M.D.   On: 12/04/2023 12:34    EKG: Independently reviewed.   Assessment/Plan  Right femoral artery Rupture s/p repair - plan of care as per vascular  -follow vascular recs   NSTEMI -CAD s/p pic RCA c/b NSTEMI 06/2023 s/p stent x 3 to LAD - continue on heparin  drip  -echo in am  -continue to cycle CE  - f/u with cardiology rec in am   CAP with associated Sepsis  -  Opacities on chest imaging /fever/mild leukocytosis/lactic acidosis -start broad spectrum antibotics  -pulmonary toilet  -urine ag, sputum, f/u on culture data  -trend inflammatory markers   DMII uncontrolled with hyperglycemia - AG13 -fs 394  - start iss/fs  - dose novolg 14units x 1 , start lantus  14 units  - monitor labs and fs q4hours  -if remains uncontrolled can consider insulin  drip    Hyponatremia -hypovolemic /hyponatremia  -gently ivfs  -monitor labs q6 hours   CHF combined systolic diastolic  - no acute exacerbation , appear hypovolemic  - holding diuretic  currently  -resume cardiac GDMT in am as able as bp  and volume status allows   PAD -s/p PTA -resume asa/plavix    HTN  - stable resume as able   OSA -02 at bedtime   DJD of the spine -supportive care   Hx of ETOH abuse -place on ciwa   DVT prophylaxis: heparin  Code Status: full/ as discussed per patient wishes in event of cardiac arrest  Family Communication: wife at bedside  Disposition Plan: patient  expected to be admitted greater than 2 midnights  Consults called: Critical care, Vascular , Cardiology Dr Pietro  Admission status: step down ICU   Camila DELENA Ned MD [facility]   If 7PM-7AM, please contact night-coverage [email or web address] Password [facility]  12/04/2023, 11:20 PM

## 2023-12-04 NOTE — H&P (View-Only) (Signed)
 MRN : [id number]   Manuel [name] is a 78 y.o. (1945-09-19) male who presents with chief complaint of check circulation.   History of Present Illness:    I am asked to see the patient by Dr. Willo.   Patient is an 78 year old gentleman who presented to the emergency room earlier today with the complaint of increasing pain secondary to a fall.  Patient attempted to transition from his bed to his wheelchair and did not successfully conclude this falling to the floor.  Initially he felt okay and refused to be brought to the hospital however as time went on he continued to be more uncomfortable with increasing pain and subsequently was brought by EMS to the hospital.  Because of his pain and the history of trauma CT scan was performed.  CT scan is reviewed by me and I concur it demonstrates a large right common femoral hematoma with active extravasation.   Patient is status post multiple vascular procedures both endovascular as well as open.  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   Active Medications  No outpatient medications have been marked as taking for the 12/04/23 encounter [facility] Encounter).            Past Medical History:  Diagnosis Date   Acute ST elevation myocardial infarction (STEMI) of inferior wall (HCC) 04/19/2010    a.) transfered from [facility] to [facility] --> LHC/PCI (very difficult procedure) --> 3.0 x 23 mm and 3.0 x 12 mm Xience stents to RCA   Allergies     Arthritis     Benign essential hypertension     Bilateral carotid artery disease (HCC) 05/08/2021    a.) carotid doppler 05/08/2021: 1-39% BICA   CAD (coronary artery disease) 04/19/2010    a.) inferior STEMI 04/19/2010 --> LHC/PCI: 50-70% pD1, 80% pRI, 90/90/90% RCA (overlapping 3.0 x 23 and 3.0 x 12 mm Xience DES); b.) MV 11/10/2018: fixed  minimally reversible inferior perfusion defect   Cellulitis of foot     Chronic HFrEF (heart failure with reduced ejection fraction) (HCC)     DDD (degenerative disc disease), cervical     Diabetes mellitus type 2, insulin  dependent (HCC)     Diverticulosis     Full dentures     Gout     Hard of hearing     History of bilateral cataract extraction 2022   History of ETOH abuse     Hyperlipidemia     Ischemic cardiomyopathy 04/19/2010    a.) TTE 04/19/2010: 40%; b.) TTE 04/20/2014: EF >55%; c.) TTE 11/10/2018: EF 45%;  d.) TTE 11/14/2022: EF 25-30%; e. 06/2023 Echo: EF 25-30%, nl RV size/fxn. Mild MR. Mild-mod TR. Ao sclerosis w/o stenosis.   Long term current use of aspirin      Long term current use of clopidogrel      Lumbar degenerative disc disease     Lumbar radiculopathy     Lumbar vertebral fracture (chronic superior endplate of L1)     NSTEMI (non-ST elevated myocardial infarction) (HCC) 01/15/2023   OSA (obstructive sleep apnea)      a.) unable to tolerate nocturnal PAP therapy   Peripheral artery disease (HCC)      a.) stenting 05/14/21: 12 mm x 12 cm LifeStent RIGHT dis SFA/prox pop; b.) s/p cath directed thrombolysis RIGHT SFA/pop 06/14/21; c.) s/p mech thrombectomy + stenting 06/15/21: 8 mm x 25cm & 8 mm x 7.5cm Viabahn; d.) s/p BILAT CFA, profunda femoris, SFA endarterectomies + fogarty embolectomy + stenting 11/15/22: 12mm x 58mm Lifestream BILAT CIAs, 14 mm x 6 cm Lifestream & 13 mm x 5 cm Viabahn LEFT EIA   Peripheral neuropathy     Umbilical hernia                 Past Surgical History:  Procedure Laterality Date   AMPUTATION Right 11/18/2022    Procedure: AMPUTATION 4TH AND 5TH RAY;  Surgeon: [name], [name];  Location: [facility];  Service: Orthopedics/Podiatry;  Laterality: Right;  4th and 5th toe   AMPUTATION Right 04/20/2023    Procedure: AMPUTATION BELOW KNEE;  Surgeon: [name], MD;  Location: [facility];  Service: Vascular;  Laterality: Right;  block then  general   APPLICATION OF WOUND VAC Right 01/09/2023    Procedure: APPLICATION OF WOUND VAC;  Surgeon: [name], MD;  Location: [facility];  Service: Vascular;  Laterality: Right;   CATARACT EXTRACTION W/PHACO Right 03/14/2021    Procedure: CATARACT EXTRACTION PHACO AND INTRAOCULAR LENS PLACEMENT (IOC) RIGHT DIABETIC;  Surgeon: [name], MD;  Location: [facility];  Service: Ophthalmology;  Laterality: Right;  Diabetic 16.78 01:39.9   CATARACT EXTRACTION W/PHACO Left 03/28/2021    Procedure: CATARACT EXTRACTION PHACO AND INTRAOCULAR LENS PLACEMENT (IOC) LEFT DIABETIC 6.93 01:22.0;  Surgeon: [name], MD;  Location: [facility];  Service: Ophthalmology;  Laterality: Left;  Diabetic   COLONOSCOPY       CORONARY ANGIOPLASTY WITH STENT PLACEMENT   03/2010    Procedure: CORONARY ANGIOPLASTY WITH STENT PLACEMENT; Location: [name]   CORONARY STENT INTERVENTION N/A 07/14/2023    Procedure: CORONARY STENT INTERVENTION;  Surgeon: [name], MD;  Location: [facility];  Service: Cardiovascular;  Laterality: N/A;   ENDARTERECTOMY FEMORAL Bilateral 11/15/2022    Procedure: BILATERAL COMMON FEMORAL PROFUNDA FEMORIS AND SUPERFICIAL FEMORAL ARTERY ENDARTECTOMIES, RIGHT FOGARTY EMBOLECTOMY OF THE RIGHT SFA  AND  POPLITEAL ARTERIES. AORTAGRAM AND RIGHT LOWER EXTREMITY ANGIOGRAM.;  Surgeon: [name], MD;  Location: [facility];  Service: Vascular;  Laterality: Bilateral;   INSERTION OF ILIAC STENT Bilateral 11/15/2022    Procedure: BILATERAL STENT INSERTION IN BILATERAL  COMMON ILIAC ARTERY, STENT INSERTION OF LEFT EXTERNAL ILIAC ARTERY. ANGIOPLASTY RIGHT TIBIAL  AND POPLITEAL ARTERY.;  Surgeon: [name], MD;  Location: [facility];  Service: Vascular;  Laterality: Bilateral;   IRRIGATION AND DEBRIDEMENT KNEE Right 04/21/2023    Procedure: IRRIGATION AND DEBRIDEMENT KNEE;  Surgeon: Zafonte, Brian Advaith, MD;  Location: [facility];  Service: Orthopedics;   Laterality: Right;   LOWER EXTREMITY ANGIOGRAPHY Right 05/14/2021  Procedure: LOWER EXTREMITY ANGIOGRAPHY;  Surgeon: [name], MD;  Location: [facility];  Service: Cardiovascular;  Laterality: Right;   LOWER EXTREMITY ANGIOGRAPHY Right 06/14/2021    Procedure: Lower Extremity Angiography;  Surgeon: [name], MD;  Location: [facility];  Service: Cardiovascular;  Laterality: Right;   LOWER EXTREMITY ANGIOGRAPHY Right 11/11/2022    Procedure: Lower Extremity Angiography;  Surgeon: [name], MD;  Location: [facility];  Service: Cardiovascular;  Laterality: Right;   LOWER EXTREMITY INTERVENTION Right 06/15/2021    Procedure: LOWER EXTREMITY INTERVENTION;  Surgeon: [name], MD;  Location: [facility];  Service: Cardiovascular;  Laterality: Right;   RIGHT/LEFT HEART CATH AND CORONARY ANGIOGRAPHY N/A 07/14/2023    Procedure: RIGHT/LEFT HEART CATH AND CORONARY ANGIOGRAPHY;  Surgeon: [name], MD;  Location: [facility];  Service: Cardiovascular;  Laterality: N/A;   TONSILLECTOMY       TRANSMETATARSAL AMPUTATION Right 02/17/2023    Procedure: TRANSMETATARSAL AMPUTATION;  Surgeon: Lennie Barter, [name];  Location: [facility];  Service: Orthopedics/Podiatry;  Laterality: Right;   WOUND DEBRIDEMENT Right 01/09/2023    Procedure: DEBRIDEMENT WOUND;  Surgeon: [name], MD;  Location: [facility];  Service: Vascular;  Laterality: Right;          Social History Social History  Social History         Tobacco Use   Smoking status: Never      Passive exposure: Never   Smokeless tobacco: Never  Vaping Use   Vaping status: Never Used  Substance Use Topics   Alcohol  use: Yes      Alcohol /week: 21.0 standard drinks of alcohol       Types: 7 Glasses of wine, 14 Cans of beer per week      Comment: occassional   Drug use: No        Family History      Family History  Problem Relation Age of Onset   Scoliosis Mother     Heart  disease Father            Allergies  No Known Allergies       REVIEW OF SYSTEMS (Negative unless checked)   Constitutional: [] Weight loss  [] Fever  [] Chills Cardiac: [] Chest pain   [] Chest pressure   [] Palpitations   [] Shortness of breath when laying flat   [] Shortness of breath with exertion. Vascular:  [x] Pain in legs with walking   [] Pain in legs at rest  [] History of DVT   [] Phlebitis   [] Swelling in legs   [] Varicose veins   [] Non-healing ulcers Pulmonary:   [] Uses home oxygen   [] Productive cough   [] Hemoptysis   [] Wheeze  [] COPD   [] Asthma Neurologic:  [] Dizziness   [] Seizures   [] History of stroke   [] History of TIA  [] Aphasia   [] Vissual changes   [] Weakness or numbness in arm   [] Weakness or numbness in leg Musculoskeletal:   [] Joint swelling   [] Joint pain   [] Low back pain Hematologic:  [] Easy bruising  [] Easy bleeding   [] Hypercoagulable state   [] Anemic Gastrointestinal:  [] Diarrhea   [] Vomiting  [] Gastroesophageal reflux/heartburn   [] Difficulty swallowing. Genitourinary:  [] Chronic kidney disease   [] Difficult urination  [] Frequent urination   [] Blood in urine Skin:  [] Rashes   [] Ulcers  Psychological:  [] History of anxiety   []  History of major depression.   Physical Examination         Vitals:    12/04/23  1551 12/04/23 1613 12/04/23 1616 12/04/23 1638  BP:   123/84   123/84  Pulse:     87 87  Resp:       19  Temp: 98.7 F (37.1 C)     98.7 F (37.1 C)  TempSrc: Oral     Oral  SpO2:     93% 93%  Weight:          Height:            Body mass index is 27.37 kg/m. Gen: WD/WN, NAD Head: [place]/AT, No temporalis wasting.  Ear/Nose/Throat: Hearing grossly intact, nares w/o erythema or drainage Eyes: PER, EOMI, sclera nonicteric.  Neck: Supple, no masses.  No bruit or JVD.  Pulmonary:  Good air movement, no audible wheezing, no use of accessory muscles.  Cardiac: RRR, normal S1, S2, no Murmurs. Vascular:  trophic changes, no open wounds right groin mildly tender  to palpation with a pulsatile mass Vessel Right Left  Radial Palpable Palpable  PT BKA Not Palpable  DP BKA Not Palpable  Gastrointestinal: soft, non-distended. No guarding/no peritoneal signs.  Musculoskeletal: M/S 5/5 throughout.  No visible deformity.  Neurologic: CN 2-12 intact. Pain and light touch intact in extremities.  Symmetrical.  Speech is fluent. Motor exam as listed above. Psychiatric: Judgment intact, Mood & affect appropriate for pt's clinical situation. Dermatologic: No rashes or ulcers noted.  No changes consistent with cellulitis.     CBC Recent Labs       Lab Results  Component Value Date    WBC 11.6 (H) 12/04/2023    HGB 14.3 12/04/2023    HCT 42.9 12/04/2023    MCV 84.6 12/04/2023    PLT 191 12/04/2023        BMET Labs (Brief)          Component Value Date/Time    NA 126 (L) 12/04/2023 1141    NA 137 07/21/2023 1246    NA 137 02/16/2012 2013    K 5.3 (H) 12/04/2023 1141    K 3.8 02/16/2012 2013    CL 91 (L) 12/04/2023 1141    CL 102 02/16/2012 2013    CO2 23 12/04/2023 1141    CO2 25 02/16/2012 2013    GLUCOSE 438 (H) 12/04/2023 1141    GLUCOSE 154 (H) 02/16/2012 2013    BUN 18 12/04/2023 1141    BUN 30 (H) 07/21/2023 1246    BUN 10 02/16/2012 2013    CREATININE 1.12 12/04/2023 1141    CREATININE 0.72 02/16/2012 2013    CALCIUM  8.9 12/04/2023 1141    CALCIUM  9.6 02/16/2012 2013    GFRNONAA >60 12/04/2023 1141    GFRNONAA >60 02/16/2012 2013    GFRAA >60 12/09/2018 0929    GFRAA >60 02/16/2012 2013      Estimated Creatinine Clearance: 50.9 mL/min (by C-G formula based on SCr of 1.12 mg/dL).   COAG Recent Labs       Lab Results  Component Value Date    INR 1.4 (H) 07/10/2023    INR 1.4 (H) 07/09/2023    INR 1.4 (H) 04/19/2023        Radiology  Imaging Results  CT CHEST ABDOMEN PELVIS W CONTRAST Result Date: 12/04/2023 CLINICAL DATA:  Sepsis.  Fall. EXAM: CT CHEST, ABDOMEN, AND PELVIS WITH CONTRAST TECHNIQUE: Multidetector CT  imaging of the chest, abdomen and pelvis was performed following the standard protocol during bolus administration of intravenous contrast. RADIATION DOSE REDUCTION: This exam was performed according to  the departmental dose-optimization program which includes automated exposure control, adjustment of the mA and/or kV according to patient size and/or use of iterative reconstruction technique. CONTRAST:  OMNIPAQUE  IOHEXOL  300 MG/ML  SOLN COMPARISON:  CT abdomen and pelvis 06/16/2021. FINDINGS: CT CHEST FINDINGS Cardiovascular: The heart is mildly enlarged. There is no pericardial effusion. There are atherosclerotic calcifications of the aorta and coronary arteries. Aorta is normal in size. Mediastinum/Nodes: No enlarged mediastinal, hilar, or axillary lymph nodes. Thyroid gland, trachea, and esophagus demonstrate no significant findings. Lungs/Pleura: There are mild patchy peripheral ground-glass opacities in the anterior right upper lobe with some focal pleural thickening. There are calcified granulomas bilaterally. The lungs are otherwise clear. There is no pleural effusion or pneumothorax. Musculoskeletal: There is some expansile cystic changes in the right glenoid and non expansile cystic changes in the left lung is cleared these are incompletely imaged. No other focal osseous lesions are identified. There is no acute fracture or dislocation identified. Multilevel degenerative changes affect the spine. There are healed right fourth through seventh rib fractures. CT ABDOMEN PELVIS FINDINGS Hepatobiliary: There is diffuse fatty infiltration of the liver. There is gallbladder wall edema. No radiopaque calculi are seen. There is no biliary ductal dilatation. Pancreas: Unremarkable. No pancreatic ductal dilatation or surrounding inflammatory changes. Spleen: Spleen is mildly enlarged. Adrenals/Urinary Tract: There is a 3 mm calculus in the distal right ureter. There is no right-sided hydronephrosis. Otherwise,  the bilateral kidneys, adrenal glands and bladder are within normal limits. Stomach/Bowel: Stomach is within normal limits. Appendix appears normal. No evidence of bowel wall thickening, distention, or inflammatory changes. There is diffuse colonic diverticulosis, most significant in the sigmoid colon and descending colon. Vascular/Lymphatic: Infrarenal abdominal aortic aneurysm measures 3.2 cm similar to prior. There is severe atherosclerotic calcifications of the aorta. IVC is normal in size. There is a vascular stent in the left external iliac artery. There is hematoma surrounding the right common femoral artery measuring 6.6 x 4.9 by 7.0 cm. There is active extravasation of a large amount of contrast into the central hematoma with source at the anterior margin of the right common femoral artery. There is some scarring surrounding the left common femoral artery. No enlarged lymph nodes are identified. Reproductive: Prostate gland is mildly enlarged. Other: There is a small fat containing umbilical hernia. There is no ascites. There is no retroperitoneal hematoma. Musculoskeletal: There is severe degenerative changes throughout the spine. Chronic L1 compression fracture is unchanged. IMPRESSION: 1. Large hematoma surrounding the right common femoral artery with active arterial extravasation of contrast into the hematoma. 2. 3.2 cm infrarenal abdominal aortic aneurysm. Recommend follow-up every 3 years. 3. Mild patchy peripheral ground-glass opacities in the anterior right upper lobe with some focal pleural thickening. Findings are nonspecific and may be infectious/inflammatory. 4. Gallbladder wall edema. No radiopaque calculi are seen. Correlate clinically for cholecystitis. 5. Nonobstructing 3 mm calculus in the distal right ureter. 6. Colonic diverticulosis. 7. Fatty infiltration of the liver. 8. Mild splenomegaly. Aortic Atherosclerosis (ICD10-I70.0). These results were called by telephone at the time of  interpretation on 12/04/2023 at 5:49 pm to provider [facility] , who verbally acknowledged these results. Electronically Signed   By: Greig Pique M.D.   On: 12/04/2023 17:49    CT Head Wo Contrast Result Date: 12/04/2023 CLINICAL DATA:  Status post fall. EXAM: CT HEAD WITHOUT CONTRAST TECHNIQUE: Contiguous axial images were obtained from the base of the skull through the vertex without intravenous contrast. RADIATION DOSE REDUCTION: This exam was  performed according to the departmental dose-optimization program which includes automated exposure control, adjustment of the mA and/or kV according to patient size and/or use of iterative reconstruction technique. COMPARISON:  April 09, 2021 FINDINGS: Brain: There is generalized cerebral atrophy with widening of the extra-axial spaces and ventricular dilatation. There are areas of decreased attenuation within the white matter tracts of the supratentorial brain, consistent with microvascular disease changes. Vascular: Marked severity bilateral cavernous carotid artery calcification is noted. Skull: Normal. Negative for fracture or focal lesion. Sinuses/Orbits: No acute finding. Other: None. IMPRESSION: 1. Generalized cerebral atrophy and microvascular disease changes of the supratentorial brain. 2. No acute intracranial abnormality. Electronically Signed   By: Suzen Dials M.D.   On: 12/04/2023 17:44    DG Shoulder Right Result Date: 12/04/2023 CLINICAL DATA:  Status post fall. EXAM: RIGHT SHOULDER - 2+ VIEW COMPARISON:  None Available. FINDINGS: There is no evidence of an acute fracture or dislocation. A chronic appearing deformity is seen along the inferior medial aspect of the right humeral head and neck. Multiple chronic right-sided rib fractures are noted. Marked severity degenerative changes are seen involving the right glenohumeral joint. Extensive chronic and degenerative changes are also present involving the right glenohumeral joint, in the  form of joint space narrowing and extensive humeral and glenoid subchondral cyst formation. Soft tissues are unremarkable. IMPRESSION: Chronic and degenerative changes, as described above, without an acute osseous abnormality. Electronically Signed   By: Suzen Dials M.D.   On: 12/04/2023 17:41    CT Cervical Spine Wo Contrast Result Date: 12/04/2023 CLINICAL DATA:  Status post fall. EXAM: CT CERVICAL SPINE WITHOUT CONTRAST TECHNIQUE: Multidetector CT imaging of the cervical spine was performed without intravenous contrast. Multiplanar CT image reconstructions were also generated. RADIATION DOSE REDUCTION: This exam was performed according to the departmental dose-optimization program which includes automated exposure control, adjustment of the mA and/or kV according to patient size and/or use of iterative reconstruction technique. COMPARISON:  None Available. FINDINGS: Alignment: There is straightening of the normal cervical spine lordosis. Approximately 2 mm anterolisthesis of the C4 vertebral body is noted on C5. Skull base and vertebrae: No acute fracture. No primary bone lesion or focal pathologic process. Chronic and degenerative changes are seen involving the body and tip of the dens, as well as the adjacent portion of the anterior arch of C1. Soft tissues and spinal canal: No prevertebral fluid or swelling. No visible canal hematoma. Disc levels: Marked severity endplate sclerosis, anterior osteophyte formation and posterior bony spurring are seen at the levels of C4-C5, C5-C6, C6-C7 and C7-T1. Posterior bony spurring is also present at the level of C3-C4. There is marked severity narrowing of the anterior atlantoaxial articulation. Marked severity intervertebral disc space narrowing is seen at C4-C5, C5-C6, C6-C7 and C7-T1. Bilateral marked severity multilevel facet joint hypertrophy is noted. Upper chest: Negative. Other: None. IMPRESSION: 1. No acute fracture or traumatic subluxation. 2. Marked  severity multilevel degenerative changes, as described above. Electronically Signed   By: Suzen Dials M.D.   On: 12/04/2023 17:36    DG Chest 2 View Result Date: 12/04/2023 CLINICAL DATA:  Weakness. EXAM: DG CHEST 2V COMPARISON:  07/09/2023. FINDINGS: Stable cardiomegaly. Aortic atherosclerosis. No focal consolidation, pleural effusion, or pneumothorax. Remote healed right-sided rib fractures. Advanced degenerative changes of the bilateral glenohumeral joints. No acute osseous abnormality identified. IMPRESSION: 1. No acute cardiopulmonary findings. 2. Stable cardiomegaly. 3.  Aortic Atherosclerosis (ICD10-I70.0). Electronically Signed   By: Harrietta Sherry M.D.   On: 12/04/2023  12:34         Assessment/Plan 1.  Right common femoral artery hematoma with active extravasation: Patient will require emergent angiography with intervention.  Plan will be for treating the common femoral with a covered stent extending into the profunda femoris from the external iliac.  CT angiography also demonstrates his SFA is occluded consistent finding given his BKA.   Risks and benefits have been reviewed.  Patient is quite familiar as he has had numerous vascular angiography and interventions.  All questions have been answered patient agrees to proceed.   2.  Coronary artery disease: Continue cardiac and antihypertensive medications as already ordered and reviewed, no changes at this time.  Of note he does have elevated troponins and we will plan to continue heparin  and continue to workup his cardiac status postintervention.  However, given the active extravasation I will move forward with angiography and intervention urgently.   Continue statin as ordered and reviewed, no changes at this time   Nitrates PRN for chest pain   3.  Insulin -dependent diabetes: Continue hypoglycemic medications as already ordered, these medications have been reviewed and there are no changes at this time.   Hgb A1C to be  monitored as already arranged by primary service   4.  Hypertension: Continue antihypertensive medications as already ordered, these medications have been reviewed and there are no changes at this time.   5.  Hypercholesterolemia: Continue statin as ordered and reviewed, no changes at this time     Cordella Shawl, MD   12/04/2023 6:51 PM

## 2023-12-04 NOTE — Op Note (Deleted)
 MRN : [id number]  Manuel [name] is a 78 y.o. (1945/06/15) male who presents with chief complaint of check circulation.  History of Present Illness:   I am asked to see the patient by Dr. Willo.  Patient is an 78 year old gentleman who presented to the emergency room earlier today with the complaint of increasing pain secondary to a fall.  Patient attempted to transition from his bed to his wheelchair and did not successfully conclude this falling to the floor.  Initially he felt okay and refused to be brought to the hospital however as time went on he continued to be more uncomfortable with increasing pain and subsequently was brought by EMS to the hospital.  Because of his pain and the history of trauma CT scan was performed.  CT scan is reviewed by me and I concur it demonstrates a large right common femoral hematoma with active extravasation.  Patient is status post multiple vascular procedures both endovascular as well as open.  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  No outpatient medications have been marked as taking for the 12/04/23 encounter [facility] Encounter).    Past Medical History:  Diagnosis Date   Acute ST elevation myocardial infarction (STEMI) of inferior wall (HCC) 04/19/2010   a.) transfered from [facility] to [facility] --> LHC/PCI (very difficult procedure) --> 3.0 x 23 mm and 3.0 x 12 mm Xience stents to RCA   Allergies    Arthritis    Benign essential hypertension    Bilateral carotid artery disease (HCC) 05/08/2021   a.) carotid doppler 05/08/2021: 1-39% BICA   CAD (coronary artery disease) 04/19/2010   a.) inferior STEMI 04/19/2010 --> LHC/PCI: 50-70% pD1, 80% pRI, 90/90/90% RCA (overlapping 3.0 x 23 and 3.0 x 12 mm Xience DES); b.) MV 11/10/2018: fixed minimally reversible inferior perfusion defect   Cellulitis of foot    Chronic HFrEF (heart failure with reduced ejection  fraction) (HCC)    DDD (degenerative disc disease), cervical    Diabetes mellitus type 2, insulin  dependent (HCC)    Diverticulosis    Full dentures    Gout    Hard of hearing    History of bilateral cataract extraction 2022   History of ETOH abuse    Hyperlipidemia    Ischemic cardiomyopathy 04/19/2010   a.) TTE 04/19/2010: 40%; b.) TTE 04/20/2014: EF >55%; c.) TTE 11/10/2018: EF 45%; d.) TTE 11/14/2022: EF 25-30%; e. 06/2023 Echo: EF 25-30%, nl RV size/fxn. Mild MR. Mild-mod TR. Ao sclerosis w/o stenosis.   Long term current use of aspirin     Long term current use of clopidogrel     Lumbar degenerative disc disease    Lumbar radiculopathy    Lumbar vertebral fracture (chronic superior endplate of L1)    NSTEMI (non-ST elevated myocardial infarction) (HCC) 01/15/2023   OSA (obstructive sleep apnea)    a.) unable to tolerate nocturnal PAP therapy   Peripheral artery disease (HCC)    a.) stenting 05/14/21: 12 mm x 12 cm LifeStent RIGHT dis SFA/prox pop; b.) s/p cath directed thrombolysis RIGHT SFA/pop 06/14/21; c.) s/p mech thrombectomy + stenting  06/15/21: 8 mm x 25cm & 8 mm x 7.5cm Viabahn; d.) s/p BILAT CFA, profunda femoris, SFA endarterectomies + fogarty embolectomy + stenting 11/15/22: 12mm x 58mm Lifestream BILAT CIAs, 14 mm x 6 cm Lifestream & 13 mm x 5 cm Viabahn LEFT EIA   Peripheral neuropathy    Umbilical hernia     Past Surgical History:  Procedure Laterality Date   AMPUTATION Right 11/18/2022   Procedure: AMPUTATION 4TH AND 5TH RAY;  Surgeon: [name], [name];  Location: [facility];  Service: Orthopedics/Podiatry;  Laterality: Right;  4th and 5th toe   AMPUTATION Right 04/20/2023   Procedure: AMPUTATION BELOW KNEE;  Surgeon: [name], MD;  Location: [facility];  Service: Vascular;  Laterality: Right;  block then general   APPLICATION OF WOUND VAC Right 01/09/2023   Procedure: APPLICATION OF WOUND VAC;  Surgeon: [name], MD;  Location: [facility];  Service: Vascular;   Laterality: Right;   CATARACT EXTRACTION W/PHACO Right 03/14/2021   Procedure: CATARACT EXTRACTION PHACO AND INTRAOCULAR LENS PLACEMENT (IOC) RIGHT DIABETIC;  Surgeon: [name], MD;  Location: [facility];  Service: Ophthalmology;  Laterality: Right;  Diabetic 16.78 01:39.9   CATARACT EXTRACTION W/PHACO Left 03/28/2021   Procedure: CATARACT EXTRACTION PHACO AND INTRAOCULAR LENS PLACEMENT (IOC) LEFT DIABETIC 6.93 01:22.0;  Surgeon: [name], MD;  Location: [facility];  Service: Ophthalmology;  Laterality: Left;  Diabetic   COLONOSCOPY     CORONARY ANGIOPLASTY WITH STENT PLACEMENT  03/2010   Procedure: CORONARY ANGIOPLASTY WITH STENT PLACEMENT; Location: [name]   CORONARY STENT INTERVENTION N/A 07/14/2023   Procedure: CORONARY STENT INTERVENTION;  Surgeon: [name], MD;  Location: [facility];  Service: Cardiovascular;  Laterality: N/A;   ENDARTERECTOMY FEMORAL Bilateral 11/15/2022   Procedure: BILATERAL COMMON FEMORAL PROFUNDA FEMORIS AND SUPERFICIAL FEMORAL ARTERY ENDARTECTOMIES, RIGHT FOGARTY EMBOLECTOMY OF THE RIGHT SFA  AND  POPLITEAL ARTERIES. AORTAGRAM AND RIGHT LOWER EXTREMITY ANGIOGRAM.;  Surgeon: [name], MD;  Location: [facility];  Service: Vascular;  Laterality: Bilateral;   INSERTION OF ILIAC STENT Bilateral 11/15/2022   Procedure: BILATERAL STENT INSERTION IN BILATERAL  COMMON ILIAC ARTERY, STENT INSERTION OF LEFT EXTERNAL ILIAC ARTERY. ANGIOPLASTY RIGHT TIBIAL  AND POPLITEAL ARTERY.;  Surgeon: [name], MD;  Location: [facility];  Service: Vascular;  Laterality: Bilateral;   IRRIGATION AND DEBRIDEMENT KNEE Right 04/21/2023   Procedure: IRRIGATION AND DEBRIDEMENT KNEE;  Surgeon: Zafonte, Brian Sante, MD;  Location: [facility];  Service: Orthopedics;  Laterality: Right;   LOWER EXTREMITY ANGIOGRAPHY Right 05/14/2021   Procedure: LOWER EXTREMITY ANGIOGRAPHY;  Surgeon: [name], MD;  Location: [facility];  Service:  Cardiovascular;  Laterality: Right;   LOWER EXTREMITY ANGIOGRAPHY Right 06/14/2021   Procedure: Lower Extremity Angiography;  Surgeon: [name], MD;  Location: [facility];  Service: Cardiovascular;  Laterality: Right;   LOWER EXTREMITY ANGIOGRAPHY Right 11/11/2022   Procedure: Lower Extremity Angiography;  Surgeon: [name], MD;  Location: [facility];  Service: Cardiovascular;  Laterality: Right;   LOWER EXTREMITY INTERVENTION Right 06/15/2021   Procedure: LOWER EXTREMITY INTERVENTION;  Surgeon: [name], MD;  Location: [facility];  Service: Cardiovascular;  Laterality: Right;   RIGHT/LEFT HEART CATH AND CORONARY ANGIOGRAPHY N/A 07/14/2023   Procedure: RIGHT/LEFT HEART CATH AND CORONARY ANGIOGRAPHY;  Surgeon: [name], MD;  Location: [facility];  Service: Cardiovascular;  Laterality: N/A;   TONSILLECTOMY  TRANSMETATARSAL AMPUTATION Right 02/17/2023   Procedure: TRANSMETATARSAL AMPUTATION;  Surgeon: Lennie Barter, [name];  Location: [facility];  Service: Orthopedics/Podiatry;  Laterality: Right;   WOUND DEBRIDEMENT Right 01/09/2023   Procedure: DEBRIDEMENT WOUND;  Surgeon: [name], MD;  Location: [facility];  Service: Vascular;  Laterality: Right;    Social History Social History   Tobacco Use   Smoking status: Never    Passive exposure: Never   Smokeless tobacco: Never  Vaping Use   Vaping status: Never Used  Substance Use Topics   Alcohol  use: Yes    Alcohol /week: 21.0 standard drinks of alcohol     Types: 7 Glasses of wine, 14 Cans of beer per week    Comment: occassional   Drug use: No    Family History Family History  Problem Relation Age of Onset   Scoliosis Mother    Heart disease Father     No Known Allergies   REVIEW OF SYSTEMS (Negative unless checked)  Constitutional: [] Weight loss  [] Fever  [] Chills Cardiac: [] Chest pain   [] Chest pressure   [] Palpitations   [] Shortness of breath when laying flat    [] Shortness of breath with exertion. Vascular:  [x] Pain in legs with walking   [] Pain in legs at rest  [] History of DVT   [] Phlebitis   [] Swelling in legs   [] Varicose veins   [] Non-healing ulcers Pulmonary:   [] Uses home oxygen   [] Productive cough   [] Hemoptysis   [] Wheeze  [] COPD   [] Asthma Neurologic:  [] Dizziness   [] Seizures   [] History of stroke   [] History of TIA  [] Aphasia   [] Vissual changes   [] Weakness or numbness in arm   [] Weakness or numbness in leg Musculoskeletal:   [] Joint swelling   [] Joint pain   [] Low back pain Hematologic:  [] Easy bruising  [] Easy bleeding   [] Hypercoagulable state   [] Anemic Gastrointestinal:  [] Diarrhea   [] Vomiting  [] Gastroesophageal reflux/heartburn   [] Difficulty swallowing. Genitourinary:  [] Chronic kidney disease   [] Difficult urination  [] Frequent urination   [] Blood in urine Skin:  [] Rashes   [] Ulcers  Psychological:  [] History of anxiety   []  History of major depression.  Physical Examination  Vitals:   12/04/23 1551 12/04/23 1613 12/04/23 1616 12/04/23 1638  BP:  123/84  123/84  Pulse:   87 87  Resp:    19  Temp: 98.7 F (37.1 C)   98.7 F (37.1 C)  TempSrc: Oral   Oral  SpO2:   93% 93%  Weight:      Height:       Body mass index is 27.37 kg/m. Gen: WD/WN, NAD Head: [place]/AT, No temporalis wasting.  Ear/Nose/Throat: Hearing grossly intact, nares w/o erythema or drainage Eyes: PER, EOMI, sclera nonicteric.  Neck: Supple, no masses.  No bruit or JVD.  Pulmonary:  Good air movement, no audible wheezing, no use of accessory muscles.  Cardiac: RRR, normal S1, S2, no Murmurs. Vascular:  trophic changes, no open wounds right groin mildly tender to palpation with a pulsatile mass Vessel Right Left  Radial Palpable Palpable  PT BKA Not Palpable  DP BKA Not Palpable  Gastrointestinal: soft, non-distended. No guarding/no peritoneal signs.  Musculoskeletal: M/S 5/5 throughout.  No visible deformity.  Neurologic: CN 2-12 intact. Pain and  light touch intact in extremities.  Symmetrical.  Speech is fluent. Motor exam as listed above. Psychiatric: Judgment intact, Mood & affect appropriate for pt's clinical situation. Dermatologic: No rashes or ulcers noted.  No changes consistent with cellulitis.  CBC Lab Results  Component Value Date   WBC 11.6 (H) 12/04/2023   HGB 14.3 12/04/2023   HCT 42.9 12/04/2023   MCV 84.6 12/04/2023   PLT 191 12/04/2023    BMET    Component Value Date/Time   NA 126 (L) 12/04/2023 1141   NA 137 07/21/2023 1246   NA 137 02/16/2012 2013   K 5.3 (H) 12/04/2023 1141   K 3.8 02/16/2012 2013   CL 91 (L) 12/04/2023 1141   CL 102 02/16/2012 2013   CO2 23 12/04/2023 1141   CO2 25 02/16/2012 2013   GLUCOSE 438 (H) 12/04/2023 1141   GLUCOSE 154 (H) 02/16/2012 2013   BUN 18 12/04/2023 1141   BUN 30 (H) 07/21/2023 1246   BUN 10 02/16/2012 2013   CREATININE 1.12 12/04/2023 1141   CREATININE 0.72 02/16/2012 2013   CALCIUM  8.9 12/04/2023 1141   CALCIUM  9.6 02/16/2012 2013   GFRNONAA >60 12/04/2023 1141   GFRNONAA >60 02/16/2012 2013   GFRAA >60 12/09/2018 0929   GFRAA >60 02/16/2012 2013   Estimated Creatinine Clearance: 50.9 mL/min (by C-G formula based on SCr of 1.12 mg/dL).  COAG Lab Results  Component Value Date   INR 1.4 (H) 07/10/2023   INR 1.4 (H) 07/09/2023   INR 1.4 (H) 04/19/2023    Radiology CT CHEST ABDOMEN PELVIS W CONTRAST Result Date: 12/04/2023 CLINICAL DATA:  Sepsis.  Fall. EXAM: CT CHEST, ABDOMEN, AND PELVIS WITH CONTRAST TECHNIQUE: Multidetector CT imaging of the chest, abdomen and pelvis was performed following the standard protocol during bolus administration of intravenous contrast. RADIATION DOSE REDUCTION: This exam was performed according to the departmental dose-optimization program which includes automated exposure control, adjustment of the mA and/or kV according to patient size and/or use of iterative reconstruction technique. CONTRAST:  OMNIPAQUE  IOHEXOL   300 MG/ML  SOLN COMPARISON:  CT abdomen and pelvis 06/16/2021. FINDINGS: CT CHEST FINDINGS Cardiovascular: The heart is mildly enlarged. There is no pericardial effusion. There are atherosclerotic calcifications of the aorta and coronary arteries. Aorta is normal in size. Mediastinum/Nodes: No enlarged mediastinal, hilar, or axillary lymph nodes. Thyroid gland, trachea, and esophagus demonstrate no significant findings. Lungs/Pleura: There are mild patchy peripheral ground-glass opacities in the anterior right upper lobe with some focal pleural thickening. There are calcified granulomas bilaterally. The lungs are otherwise clear. There is no pleural effusion or pneumothorax. Musculoskeletal: There is some expansile cystic changes in the right glenoid and non expansile cystic changes in the left lung is cleared these are incompletely imaged. No other focal osseous lesions are identified. There is no acute fracture or dislocation identified. Multilevel degenerative changes affect the spine. There are healed right fourth through seventh rib fractures. CT ABDOMEN PELVIS FINDINGS Hepatobiliary: There is diffuse fatty infiltration of the liver. There is gallbladder wall edema. No radiopaque calculi are seen. There is no biliary ductal dilatation. Pancreas: Unremarkable. No pancreatic ductal dilatation or surrounding inflammatory changes. Spleen: Spleen is mildly enlarged. Adrenals/Urinary Tract: There is a 3 mm calculus in the distal right ureter. There is no right-sided hydronephrosis. Otherwise, the bilateral kidneys, adrenal glands and bladder are within normal limits. Stomach/Bowel: Stomach is within normal limits. Appendix appears normal. No evidence of bowel wall thickening, distention, or inflammatory changes. There is diffuse colonic diverticulosis, most significant in the sigmoid colon and descending colon. Vascular/Lymphatic: Infrarenal abdominal aortic aneurysm measures 3.2 cm similar to prior. There is severe  atherosclerotic calcifications of the aorta. IVC is normal in size. There is a vascular stent  in the left external iliac artery. There is hematoma surrounding the right common femoral artery measuring 6.6 x 4.9 by 7.0 cm. There is active extravasation of a large amount of contrast into the central hematoma with source at the anterior margin of the right common femoral artery. There is some scarring surrounding the left common femoral artery. No enlarged lymph nodes are identified. Reproductive: Prostate gland is mildly enlarged. Other: There is a small fat containing umbilical hernia. There is no ascites. There is no retroperitoneal hematoma. Musculoskeletal: There is severe degenerative changes throughout the spine. Chronic L1 compression fracture is unchanged. IMPRESSION: 1. Large hematoma surrounding the right common femoral artery with active arterial extravasation of contrast into the hematoma. 2. 3.2 cm infrarenal abdominal aortic aneurysm. Recommend follow-up every 3 years. 3. Mild patchy peripheral ground-glass opacities in the anterior right upper lobe with some focal pleural thickening. Findings are nonspecific and may be infectious/inflammatory. 4. Gallbladder wall edema. No radiopaque calculi are seen. Correlate clinically for cholecystitis. 5. Nonobstructing 3 mm calculus in the distal right ureter. 6. Colonic diverticulosis. 7. Fatty infiltration of the liver. 8. Mild splenomegaly. Aortic Atherosclerosis (ICD10-I70.0). These results were called by telephone at the time of interpretation on 12/04/2023 at 5:49 pm to provider [facility] , who verbally acknowledged these results. Electronically Signed   By: Greig Pique M.D.   On: 12/04/2023 17:49   CT Head Wo Contrast Result Date: 12/04/2023 CLINICAL DATA:  Status post fall. EXAM: CT HEAD WITHOUT CONTRAST TECHNIQUE: Contiguous axial images were obtained from the base of the skull through the vertex without intravenous contrast. RADIATION DOSE  REDUCTION: This exam was performed according to the departmental dose-optimization program which includes automated exposure control, adjustment of the mA and/or kV according to patient size and/or use of iterative reconstruction technique. COMPARISON:  April 09, 2021 FINDINGS: Brain: There is generalized cerebral atrophy with widening of the extra-axial spaces and ventricular dilatation. There are areas of decreased attenuation within the white matter tracts of the supratentorial brain, consistent with microvascular disease changes. Vascular: Marked severity bilateral cavernous carotid artery calcification is noted. Skull: Normal. Negative for fracture or focal lesion. Sinuses/Orbits: No acute finding. Other: None. IMPRESSION: 1. Generalized cerebral atrophy and microvascular disease changes of the supratentorial brain. 2. No acute intracranial abnormality. Electronically Signed   By: Suzen Dials M.D.   On: 12/04/2023 17:44   DG Shoulder Right Result Date: 12/04/2023 CLINICAL DATA:  Status post fall. EXAM: RIGHT SHOULDER - 2+ VIEW COMPARISON:  None Available. FINDINGS: There is no evidence of an acute fracture or dislocation. A chronic appearing deformity is seen along the inferior medial aspect of the right humeral head and neck. Multiple chronic right-sided rib fractures are noted. Marked severity degenerative changes are seen involving the right glenohumeral joint. Extensive chronic and degenerative changes are also present involving the right glenohumeral joint, in the form of joint space narrowing and extensive humeral and glenoid subchondral cyst formation. Soft tissues are unremarkable. IMPRESSION: Chronic and degenerative changes, as described above, without an acute osseous abnormality. Electronically Signed   By: Suzen Dials M.D.   On: 12/04/2023 17:41   CT Cervical Spine Wo Contrast Result Date: 12/04/2023 CLINICAL DATA:  Status post fall. EXAM: CT CERVICAL SPINE WITHOUT CONTRAST  TECHNIQUE: Multidetector CT imaging of the cervical spine was performed without intravenous contrast. Multiplanar CT image reconstructions were also generated. RADIATION DOSE REDUCTION: This exam was performed according to the departmental dose-optimization program which includes automated exposure control, adjustment of the  mA and/or kV according to patient size and/or use of iterative reconstruction technique. COMPARISON:  None Available. FINDINGS: Alignment: There is straightening of the normal cervical spine lordosis. Approximately 2 mm anterolisthesis of the C4 vertebral body is noted on C5. Skull base and vertebrae: No acute fracture. No primary bone lesion or focal pathologic process. Chronic and degenerative changes are seen involving the body and tip of the dens, as well as the adjacent portion of the anterior arch of C1. Soft tissues and spinal canal: No prevertebral fluid or swelling. No visible canal hematoma. Disc levels: Marked severity endplate sclerosis, anterior osteophyte formation and posterior bony spurring are seen at the levels of C4-C5, C5-C6, C6-C7 and C7-T1. Posterior bony spurring is also present at the level of C3-C4. There is marked severity narrowing of the anterior atlantoaxial articulation. Marked severity intervertebral disc space narrowing is seen at C4-C5, C5-C6, C6-C7 and C7-T1. Bilateral marked severity multilevel facet joint hypertrophy is noted. Upper chest: Negative. Other: None. IMPRESSION: 1. No acute fracture or traumatic subluxation. 2. Marked severity multilevel degenerative changes, as described above. Electronically Signed   By: Suzen Dials M.D.   On: 12/04/2023 17:36   DG Chest 2 View Result Date: 12/04/2023 CLINICAL DATA:  Weakness. EXAM: DG CHEST 2V COMPARISON:  07/09/2023. FINDINGS: Stable cardiomegaly. Aortic atherosclerosis. No focal consolidation, pleural effusion, or pneumothorax. Remote healed right-sided rib fractures. Advanced degenerative changes of  the bilateral glenohumeral joints. No acute osseous abnormality identified. IMPRESSION: 1. No acute cardiopulmonary findings. 2. Stable cardiomegaly. 3.  Aortic Atherosclerosis (ICD10-I70.0). Electronically Signed   By: Harrietta Sherry M.D.   On: 12/04/2023 12:34     Assessment/Plan 1.  Right common femoral artery hematoma with active extravasation: Patient will require emergent angiography with intervention.  Plan will be for treating the common femoral with a covered stent extending into the profunda femoris from the external iliac.  CT angiography also demonstrates his SFA is occluded consistent finding given his BKA.  Risks and benefits have been reviewed.  Patient is quite familiar as he has had numerous vascular angiography and interventions.  All questions have been answered patient agrees to proceed.  2.  Coronary artery disease: Continue cardiac and antihypertensive medications as already ordered and reviewed, no changes at this time.  Of note he does have elevated troponins and we will plan to continue heparin  and continue to workup his cardiac status postintervention.  However, given the active extravasation I will move forward with angiography and intervention urgently.  Continue statin as ordered and reviewed, no changes at this time  Nitrates PRN for chest pain  3.  Insulin -dependent diabetes: Continue hypoglycemic medications as already ordered, these medications have been reviewed and there are no changes at this time.  Hgb A1C to be monitored as already arranged by primary service  4.  Hypertension: Continue antihypertensive medications as already ordered, these medications have been reviewed and there are no changes at this time.  5.  Hypercholesterolemia: Continue statin as ordered and reviewed, no changes at this time   Cordella Shawl, MD  12/04/2023 6:51 PM

## 2023-12-04 NOTE — Interval H&P Note (Signed)
 History and Physical Interval Note:  12/04/2023 7:01 PM  Manuel [name]  has presented today for surgery, with the diagnosis of hemorrhage.  The various methods of treatment have been discussed with the patient and family. After consideration of risks, benefits and other options for treatment, the patient has consented to  Procedure(s): Lower Extremity Angiography (Right) as a surgical intervention.  The patient's history has been reviewed, patient examined, no change in status, stable for surgery.  I have reviewed the patient's chart and labs.  Questions were answered to the patient's satisfaction.     Cordella Shawl

## 2023-12-04 NOTE — ED Triage Notes (Signed)
 Refer to first nurse note.

## 2023-12-04 NOTE — Progress Notes (Signed)
 NAME:  Manuel [name], MRN:  [id number], DOB:  05-06-1945, LOS: 0 ADMISSION DATE:  12/04/2023, CONSULTATION DATE:  12/04/23 REFERRING MD:  Dr. Debby, CHIEF COMPLAINT: Weakness    History of Present Illness:  78 yo M presenting to [facility] ED from home via EMS for evaluation of increasing pain secondary to a fall.  History obtained per chart review and patient/family bedside report. The patient had been feeling terrible for days and had been complaining of right groin pain. Then on the morning of 12/04/23 the patient was attempting to transition from his bed to his wheelchair, but fell to the floor.  He hit his head but denies losing consciousness and spent 2 hours on the ground before he was able to contact his wife.  He initially refused EMS. However as time went on he began having increasing pain, was weak and diaphoretic and subsequently was brought by EMS to the ED. He denied fevers, cough, dyspnea, chest pain, dysuria Wife also reported the patient's urine has a strong foul odor and is concerned about a UTI.   EMS reported all vitals within normal limits upon arrival.  ED course: Upon arrival patient was alert and responsive with borderline fever at 99.3 and mild tachycardia at 113.  Labs significant for mild-pseudohyponatremia, hyperglycemia, hypochloremia, hyperkalemia, elevated bilirubin, elevated troponin, mild leukocytosis and lactic acidosis.  Imaging revealed large right femoral hematoma with active hemorrhage.  Vascular surgery was urgently consulted and patient taken for stent placement.  Postop transferred to stepdown unit for monitoring, TRH consulted for admission. Medications given: Morphine , cefazolin , NS 1 L bolus, IV contrast, fentanyl /Versed /morphine  Initial Vitals: 99.3, 19, 113, 131/72 and 95% on room air Significant labs: (Labs/ Imaging personally reviewed) I, Jenita Ruth Rust-Chester, AGACNP-BC, personally viewed and interpreted this ECG. EKG Interpretation: Date:  12/04/2023, EKG Time: 11:43, Rate: 113, Rhythm: ST, QRS Axis: RAD, Intervals: Normal, ST/T Wave abnormalities: Inferolateral T wave inversions, Narrative Interpretation: Sinus tachycardia with inferolateral T wave inversions Chemistry: Na+: 126, K+: 5.3, Cl: 91, BUN/Cr.:  18/1.12, Serum CO2/ AG: 23/12, glucose: 438, T. Bili:1.7 Hematology: WBC: 11.6, Hgb: 14.3,  Troponin: 715 > 6596, Lactic: 2.9 > 3.3,  COVID-19 & Influenza A/B: Negative  CXR 12/04/2023: No acute cardiopulmonary findings, stable cardiomegaly, aortic atherosclerosis CT head without contrast 12/04/2023: Generalized cerebral atrophy and microvascular disease changes of the supratentorial brain.  No acute intracranial abnormality CT chest abdomen and pelvis with contrast 12/04/2023:  Large hematoma surrounding the right common femoral artery with active arterial extravasation of contrast into the hematoma. 2. 3.2 cm infrarenal abdominal aortic aneurysm. Recommend follow-up every 3 years. 3. Mild patchy peripheral ground-glass opacities in the anterior right upper lobe with some focal pleural thickening. Findings are nonspecific and may be infectious/inflammatory. 4. Gallbladder wall edema. No radiopaque calculi are seen. Correlate clinically for cholecystitis. 5. Nonobstructing 3 mm calculus in the distal right ureter. 6. Colonic diverticulosis. 7. Fatty infiltration of the liver. 8. Mild splenomegaly. CT cervical spine without contrast 12/04/2023: No acute fracture or traumatic subluxation.  Marked severity multilevel degenerative changes as described above  PCCM consulted for assistance in management and monitoring due to large right femoral hematoma with active hemorrhage status post stent placement, possible sepsis and suspected NSTEMI.  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  Interim History / Subjective:  Patient alert and responsive on 2 L nasal cannula with stable vital signs postop.  Right groin site clean dry and intact no signs of hematoma or bleeding. Spouse bedside, plan of care discussed all questions and concerns answered at this time.  Objective    Blood pressure (!) 168/90, pulse (!) 108, temperature 99 F (37.2 C), temperature source Oral, resp. rate (!) 22, height 5' 8 (1.727 m), weight 81.6 kg, SpO2 100%.       No intake or output data in the 24 hours ending 12/04/23 2110 Filed Weights   12/04/23 1138  Weight: 81.6 kg    Examination: General: Adult male, critically ill, lying in bed, NAD HEENT: MM pink/moist, anicteric, atraumatic, neck supple Neuro: A&O x 2, follows commands, PERRL +3, MAE CV: s1s2 RRR, NSR on monitor, no r/m/g Pulm: Regular, non labored on 2 L nasal cannula, breath sounds clear-BUL & diminished-BLL GI: soft, rounded, non tender, bs x 4 GU: Pure wick in place  with clear urine amber yellow urine Skin:  no rashes/lesions noted Extremities: warm/dry, pulses + 2, no edema noted  Resolved problem list   Assessment and Plan  #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 #Elevated Troponin secondary to N-STEMI vs demand ischemia #Suspected Sepsis #Mild Hyponatremia/ Pseudohyponatremia #HFrEF #OSA on CPAP - Trend troponin - Supplemental oxygen as needed, to maintain SpO2 > 90% - f/u cultures, trend lactic/ PCT, f/u VBG - consider checking a beta-hydroxy due to +ketones and severe hyperglycemia - recommend frequent CBG checks, diabetes coordinator consultation and insulin  drip vs SQ correction - Daily CBC, monitor WBC/ fever curve - IV antibiotics: cefepime ,  azithromycin  & vancomycin  - Echocardiogram ordered - heparin  drip continued - continuous cardiac monitoring - consider Cardiology consultation - vascular surgery following, appreciate input  - monitor for signs of bleeding, monitor site per protocol - transfuse PRN for Hgb < 7 - CPAP nightly, bronchodilators PRN - recommend conservative IVF replacement given EF  Thank you for involving PCCM in your care. This patient has no critical care needs at this time. Will sign off at this time, please consider re-consulting PRN  Best Practice (right click and Reselect all SmartList Selections daily)  Diet/type: clear liquids DVT prophylaxis systemic heparin  Pressure ulcer(s): N/A GI prophylaxis: N/A Lines: N/A Foley:  N/A Code Status:  full code Last date of multidisciplinary goals of care discussion [per primary]  Labs   CBC: Recent Labs  Lab 12/04/23 1141  WBC 11.6*  NEUTROABS 10.5*  HGB 14.3  HCT 42.9  MCV 84.6  PLT 191    Basic Metabolic Panel: Recent Labs  Lab 12/04/23 1141  NA 126*  K 5.3*  CL 91*  CO2 23  GLUCOSE 438*  BUN 18  CREATININE 1.12  CALCIUM  8.9   GFR: Estimated Creatinine Clearance: 50.9 mL/min (by C-G formula based on SCr of 1.12 mg/dL). Recent Labs  Lab 12/04/23 1141 12/04/23 1548  WBC 11.6*  --   LATICACIDVEN 2.9* 3.3*    Liver Function Tests: Recent Labs  Lab 12/04/23 1141  AST 27  ALT 15  ALKPHOS 90  BILITOT 1.7*  PROT 6.9  ALBUMIN 3.5   No results for input(s): LIPASE, AMYLASE in the last 168 hours. No results for input(s): AMMONIA in the last 168 hours.  ABG    Component Value Date/Time  PHART 7.468 (H) 07/14/2023 1118   PCO2ART 41.1 07/14/2023 1118   PO2ART 65 (L) 07/14/2023 1118   HCO3 32.1 (H) 07/14/2023 1125   TCO2 34 (H) 07/14/2023 1125   O2SAT 51 07/14/2023 1125     Coagulation Profile: No results for input(s): INR, PROTIME in the last 168 hours.  Cardiac Enzymes: Recent Labs  Lab 12/04/23 1141   CKTOTAL 115    HbA1C: Hemoglobin A1C  Date/Time Value Ref Range Status  07/28/2023 11:48 AM 8.0 (A) 4.0 - 5.6 % Final   Hgb A1c MFr Bld  Date/Time Value Ref Range Status  07/10/2023 04:52 AM 8.0 (H) 4.8 - 5.6 % Final    Comment:    (NOTE) Pre diabetes:          5.7%-6.4%  Diabetes:              >6.4%  Glycemic control for   <7.0% adults with diabetes   11/10/2022 07:31 PM 6.9 (H) 4.8 - 5.6 % Final    Comment:    (NOTE)         Prediabetes: 5.7 - 6.4         Diabetes: >6.4         Glycemic control for adults with diabetes: <7.0     CBG: Recent Labs  Lab 12/04/23 1824  GLUCAP 411*    Review of Systems: positives in BOLD   Gen: Denies fever, chills, weight change, fatigue, right groin pain, fall, night sweats HEENT: Denies blurred vision, double vision, hearing loss, tinnitus, sinus congestion, rhinorrhea, sore throat, neck stiffness, dysphagia PULM: Denies shortness of breath, cough, sputum production, hemoptysis, wheezing CV: Denies chest pain, edema, orthopnea, paroxysmal nocturnal dyspnea, palpitations GI: Denies abdominal pain, nausea, vomiting, diarrhea, hematochezia, melena, constipation, change in bowel habits GU: Denies dysuria, hematuria, polyuria, oliguria, urethral discharge Endocrine: Denies hot or cold intolerance, polyuria, polyphagia or appetite change Derm: Denies rash, dry skin, scaling or peeling skin change Heme: Denies easy bruising, bleeding, bleeding gums Neuro: Denies headache, numbness, weakness, slurred speech, loss of memory or consciousness  Past Medical History:  He,  has a past medical history of Acute ST elevation myocardial infarction (STEMI) of inferior wall (HCC) (04/19/2010), Allergies, Arthritis, Benign essential hypertension, Bilateral carotid artery disease (HCC) (05/08/2021), CAD (coronary artery disease) (04/19/2010), Cellulitis of foot, Chronic HFrEF (heart failure with reduced ejection fraction) (HCC), DDD (degenerative disc  disease), cervical, Diabetes mellitus type 2, insulin  dependent (HCC), Diverticulosis, Full dentures, Gout, Hard of hearing, History of bilateral cataract extraction (2022), History of ETOH abuse, Hyperlipidemia, Ischemic cardiomyopathy (04/19/2010), Long term current use of aspirin , Long term current use of clopidogrel , Lumbar degenerative disc disease, Lumbar radiculopathy, Lumbar vertebral fracture (chronic superior endplate of L1), NSTEMI (non-ST elevated myocardial infarction) (HCC) (01/15/2023), OSA (obstructive sleep apnea), Peripheral artery disease (HCC), Peripheral neuropathy, and Umbilical hernia.   Surgical History:   Past Surgical History:  Procedure Laterality Date   AMPUTATION Right 11/18/2022   Procedure: AMPUTATION 4TH AND 5TH RAY;  Surgeon: [name], [name];  Location: [facility];  Service: Orthopedics/Podiatry;  Laterality: Right;  4th and 5th toe   AMPUTATION Right 04/20/2023   Procedure: AMPUTATION BELOW KNEE;  Surgeon: [name], MD;  Location: [facility];  Service: Vascular;  Laterality: Right;  block then general   APPLICATION OF WOUND VAC Right 01/09/2023   Procedure: APPLICATION OF WOUND VAC;  Surgeon: [name], MD;  Location: [facility];  Service: Vascular;  Laterality: Right;   CATARACT EXTRACTION W/PHACO Right  03/14/2021   Procedure: CATARACT EXTRACTION PHACO AND INTRAOCULAR LENS PLACEMENT (IOC) RIGHT DIABETIC;  Surgeon: [name], MD;  Location: [facility];  Service: Ophthalmology;  Laterality: Right;  Diabetic 16.78 01:39.9   CATARACT EXTRACTION W/PHACO Left 03/28/2021   Procedure: CATARACT EXTRACTION PHACO AND INTRAOCULAR LENS PLACEMENT (IOC) LEFT DIABETIC 6.93 01:22.0;  Surgeon: [name], MD;  Location: [facility];  Service: Ophthalmology;  Laterality: Left;  Diabetic   COLONOSCOPY     CORONARY ANGIOPLASTY WITH STENT PLACEMENT  03/2010   Procedure: CORONARY ANGIOPLASTY WITH STENT PLACEMENT; Location: [name]   CORONARY  STENT INTERVENTION N/A 07/14/2023   Procedure: CORONARY STENT INTERVENTION;  Surgeon: [name], MD;  Location: [facility];  Service: Cardiovascular;  Laterality: N/A;   ENDARTERECTOMY FEMORAL Bilateral 11/15/2022   Procedure: BILATERAL COMMON FEMORAL PROFUNDA FEMORIS AND SUPERFICIAL FEMORAL ARTERY ENDARTECTOMIES, RIGHT FOGARTY EMBOLECTOMY OF THE RIGHT SFA  AND  POPLITEAL ARTERIES. AORTAGRAM AND RIGHT LOWER EXTREMITY ANGIOGRAM.;  Surgeon: [name], MD;  Location: [facility];  Service: Vascular;  Laterality: Bilateral;   INSERTION OF ILIAC STENT Bilateral 11/15/2022   Procedure: BILATERAL STENT INSERTION IN BILATERAL  COMMON ILIAC ARTERY, STENT INSERTION OF LEFT EXTERNAL ILIAC ARTERY. ANGIOPLASTY RIGHT TIBIAL  AND POPLITEAL ARTERY.;  Surgeon: [name], MD;  Location: [facility];  Service: Vascular;  Laterality: Bilateral;   IRRIGATION AND DEBRIDEMENT KNEE Right 04/21/2023   Procedure: IRRIGATION AND DEBRIDEMENT KNEE;  Surgeon: Zafonte, Brian Flint, MD;  Location: [facility];  Service: Orthopedics;  Laterality: Right;   LOWER EXTREMITY ANGIOGRAPHY Right 05/14/2021   Procedure: LOWER EXTREMITY ANGIOGRAPHY;  Surgeon: [name], MD;  Location: [facility];  Service: Cardiovascular;  Laterality: Right;   LOWER EXTREMITY ANGIOGRAPHY Right 06/14/2021   Procedure: Lower Extremity Angiography;  Surgeon: [name], MD;  Location: [facility];  Service: Cardiovascular;  Laterality: Right;   LOWER EXTREMITY ANGIOGRAPHY Right 11/11/2022   Procedure: Lower Extremity Angiography;  Surgeon: [name], MD;  Location: [facility];  Service: Cardiovascular;  Laterality: Right;   LOWER EXTREMITY INTERVENTION Right 06/15/2021   Procedure: LOWER EXTREMITY INTERVENTION;  Surgeon: [name], MD;  Location: [facility];  Service: Cardiovascular;  Laterality: Right;   RIGHT/LEFT HEART CATH AND CORONARY ANGIOGRAPHY N/A 07/14/2023   Procedure: RIGHT/LEFT HEART  CATH AND CORONARY ANGIOGRAPHY;  Surgeon: [name], MD;  Location: [facility];  Service: Cardiovascular;  Laterality: N/A;   TONSILLECTOMY     TRANSMETATARSAL AMPUTATION Right 02/17/2023   Procedure: TRANSMETATARSAL AMPUTATION;  Surgeon: Lennie Barter, [name];  Location: [facility];  Service: Orthopedics/Podiatry;  Laterality: Right;   WOUND DEBRIDEMENT Right 01/09/2023   Procedure: DEBRIDEMENT WOUND;  Surgeon: [name], MD;  Location: [facility];  Service: Vascular;  Laterality: Right;     Social History:   reports that he has never smoked. He has never been exposed to tobacco smoke. He has never used smokeless tobacco. He reports current alcohol  use of about 21.0 standard drinks of alcohol  per week. He reports that he does not use drugs.   Family History:  His family history includes Heart disease in his father; Scoliosis in his mother.   Allergies No Known Allergies   Home Medications  Prior to Admission medications   Medication Sig Start Date End Date Taking? Authorizing Provider  aspirin  EC 81 MG tablet Take 1 tablet (81 mg total) by mouth daily. 11/26/22   Maree Hue, MD  atorvastatin  (LIPITOR ) 80 MG tablet Take 1 tablet (80 mg total) by mouth at bedtime. 11/18/23   Gollan, Timothy J, MD  carvedilol  (COREG ) 25 MG tablet Take 1 tablet (25 mg total) by mouth 2 (two) times daily with a meal. 11/18/23   Gollan, Evalene PARAS, MD  clopidogrel  (PLAVIX ) 75 MG tablet Take 1 tablet (75 mg total) by mouth daily. 11/18/23   Gollan, Timothy J, MD  dapagliflozin  propanediol (FARXIGA ) 10 MG TABS tablet Take 1 tablet (10 mg total) by mouth daily. 11/18/23   Gollan, Timothy J, MD  Ensure Max Protein (ENSURE MAX PROTEIN) LIQD Take 330 mLs (11 oz total) by mouth 2 (two) times daily. Patient taking differently: Take 11 oz by mouth daily. 11/26/22   Maree Hue, MD  escitalopram  (LEXAPRO ) 10 MG tablet Take 1 tablet (10 mg total) by mouth daily. Patient taking differently: Take 20 mg by mouth daily.  10/30/23   [name], [name], MD  ferrous sulfate 325 (65 FE) MG EC tablet Take 325 mg by mouth daily with breakfast.    [provider]  folic acid  (FOLVITE ) 1 MG tablet Take 1 tablet (1 mg total) by mouth daily. 09/12/23   [name], [name], MD  furosemide  (LASIX ) 40 MG tablet Take 1 tablet (40 mg total) by mouth daily. 11/18/23   Gollan, Timothy J, MD  gabapentin  (NEURONTIN ) 100 MG capsule 1 po at bedtime. 10/30/23   [name], [name], MD  insulin  aspart protamine - aspart (NOVOLOG  MIX 70/30) (70-30) 100 UNIT/ML injection Inject 30 Units into the skin 2 (two) times daily with a meal.    [provider]  insulin  glargine (LANTUS  SOLOSTAR) 100 UNIT/ML Solostar Pen Inject 10 Units into the skin daily. Patient not taking: Reported on 11/18/2023 07/28/23   [name], [name], MD  methocarbamol  (ROBAXIN ) 500 MG tablet Take 500 mg by mouth every 8 (eight) hours as needed for muscle spasms.    [provider]  metolazone  (ZAROXOLYN ) 2.5 MG tablet Take 1 tablet (2.5 mg total) by mouth daily for 3 days. Take 30 minutes before your lasix . Patient not taking: Reported on 11/18/2023 07/21/23 11/18/23  Donette Ellouise LABOR, FNP  nitroGLYCERIN  (NITROSTAT ) 0.4 MG SL tablet Place 1 tablet (0.4 mg total) under the tongue every 5 (five) minutes as needed for chest pain. Max: 3 tablets per day 07/16/23 07/15/24  Dorinda Drue DASEN, MD  ondansetron  (ZOFRAN ) 4 MG tablet Take 1 tablet (4 mg total) by mouth every 6 (six) hours as needed for nausea. 05/08/23   Laurence Locus, DO  polyethylene glycol (MIRALAX  / GLYCOLAX ) 17 g packet Take 17 g by mouth daily as needed for mild constipation. 02/19/23   Caleen Qualia, MD  sacubitril -valsartan  (ENTRESTO ) 24-26 MG Take 1 tablet by mouth every 12 (twelve) hours. 11/18/23 11/17/24  Gollan, Timothy J, MD  senna-docusate (SENOKOT-S) 8.6-50 MG tablet Take 1 tablet by mouth at bedtime as needed for mild constipation. 06/17/21   Jillian Buttery, MD  spironolactone  (ALDACTONE ) 25 MG tablet Take 1  tablet (25 mg total) by mouth daily. 11/18/23   Gollan, Timothy J, MD  thiamine  (VITAMIN B-1) 100 MG tablet Take 1 tablet (100 mg total) by mouth daily. 02/20/23   Amin, Sumayya, MD  traZODone  (DESYREL ) 150 MG tablet Take 75 mg by mouth at bedtime. Patient not taking: Reported on 11/18/2023    [provider]     Critical care time: 55 minutes       Jenita Ruth Rust-Chester, AGACNP-BC Acute Care Nurse Practitioner  [facility]   [phone] / [phone] Please see Amion for details.

## 2023-12-04 NOTE — Consult Note (Signed)
 PHARMACY - ANTICOAGULATION CONSULT NOTE  Pharmacy Consult for IV Heparin  Indication: chest pain/ACS  No Known Allergies  Patient Measurements: Height: 5' 8 (172.7 cm) Weight: 81.6 kg (180 lb) IBW/kg (Calculated) : 68.4 HEPARIN  DW (KG): 81.6  Vital Signs: Temp: 100.7 F (38.2 C) (08/14 1850) Temp Source: Oral (08/14 1850) BP: 133/81 (08/14 2000) Pulse Rate: 101 (08/14 1930)  Labs: Recent Labs    12/04/23 1141 12/04/23 1822  HGB 14.3  --   HCT 42.9  --   PLT 191  --   CREATININE 1.12  --   CKTOTAL 115  --   TROPONINIHS 715* 6,596*    Estimated Creatinine Clearance: 50.9 mL/min (by C-G formula based on SCr of 1.12 mg/dL).   Medical History: Past Medical History:  Diagnosis Date   Acute ST elevation myocardial infarction (STEMI) of inferior wall (HCC) 04/19/2010   a.) transfered from [facility] to [facility] --> LHC/PCI (very difficult procedure) --> 3.0 x 23 mm and 3.0 x 12 mm Xience stents to RCA   Allergies    Arthritis    Benign essential hypertension    Bilateral carotid artery disease (HCC) 05/08/2021   a.) carotid doppler 05/08/2021: 1-39% BICA   CAD (coronary artery disease) 04/19/2010   a.) inferior STEMI 04/19/2010 --> LHC/PCI: 50-70% pD1, 80% pRI, 90/90/90% RCA (overlapping 3.0 x 23 and 3.0 x 12 mm Xience DES); b.) MV 11/10/2018: fixed minimally reversible inferior perfusion defect   Cellulitis of foot    Chronic HFrEF (heart failure with reduced ejection fraction) (HCC)    DDD (degenerative disc disease), cervical    Diabetes mellitus type 2, insulin  dependent (HCC)    Diverticulosis    Full dentures    Gout    Hard of hearing    History of bilateral cataract extraction 2022   History of ETOH abuse    Hyperlipidemia    Ischemic cardiomyopathy 04/19/2010   a.) TTE 04/19/2010: 40%; b.) TTE 04/20/2014: EF >55%; c.) TTE 11/10/2018: EF 45%; d.) TTE 11/14/2022: EF 25-30%; e. 06/2023 Echo: EF 25-30%, nl RV size/fxn. Mild MR. Mild-mod TR. Ao sclerosis w/o stenosis.    Long term current use of aspirin     Long term current use of clopidogrel     Lumbar degenerative disc disease    Lumbar radiculopathy    Lumbar vertebral fracture (chronic superior endplate of L1)    NSTEMI (non-ST elevated myocardial infarction) (HCC) 01/15/2023   OSA (obstructive sleep apnea)    a.) unable to tolerate nocturnal PAP therapy   Peripheral artery disease (HCC)    a.) stenting 05/14/21: 12 mm x 12 cm LifeStent RIGHT dis SFA/prox pop; b.) s/p cath directed thrombolysis RIGHT SFA/pop 06/14/21; c.) s/p mech thrombectomy + stenting 06/15/21: 8 mm x 25cm & 8 mm x 7.5cm Viabahn; d.) s/p BILAT CFA, profunda femoris, SFA endarterectomies + fogarty embolectomy + stenting 11/15/22: 12mm x 58mm Lifestream BILAT CIAs, 14 mm x 6 cm Lifestream & 13 mm x 5 cm Viabahn LEFT EIA   Peripheral neuropathy    Umbilical hernia     Medications:  No anticoagulation prior to admission per my chart review. Takes ASA / Plavix  at home  Assessment: 78 y/o M with medical history as above and including CAD, PAD presenting to the ED 8/14 with right common femoral artery hematoma with active extravasation. Patient underwent endovascular intervention same day. However, clinical picture complicated by rising troponin (715 >> 6596). Pharmacy consulted to dose heparin .  Baseline aPTT and INR are pending. Baseline H&H, platelets  are within normal limits.  Goal of Therapy:  Heparin  level 0.3-0.7 units/ml Monitor platelets by anticoagulation protocol: Yes   Plan:  --Start heparin  at 1000 units/hr, no bolus as patient received heparin  in procedure --Check HL 8 hours from start --Daily CBC per protocol while on IV heparin ; monitor closely for bleeding  Marolyn KATHEE Mare 12/04/2023,8:13 PM

## 2023-12-04 NOTE — ED Notes (Addendum)
 First Nurse Note: Per EMS pt fell this AM while transferring to his wheelchair with the assistance of his wife. Pt refused EMS at that time and called back 15 min later and was weak and diaphoretic. Pt denies hitting his head. Per EMS all VS are WNL. Wife sts that pt has been having different odors to his urine and thinks that he might has a UTI.

## 2023-12-04 NOTE — Consult Note (Signed)
 MRN : [id number]   Manuel [name] is a 78 y.o. (1945-12-31) male who presents with chief complaint of check circulation.   History of Present Illness:    I am asked to see the patient by Dr. Willo.   Patient is an 78 year old gentleman who presented to the emergency room earlier today with the complaint of increasing pain secondary to a fall.  Patient attempted to transition from his bed to his wheelchair and did not successfully conclude this falling to the floor.  Initially he felt okay and refused to be brought to the hospital however as time went on he continued to be more uncomfortable with increasing pain and subsequently was brought by EMS to the hospital.  Because of his pain and the history of trauma CT scan was performed.  CT scan is reviewed by me and I concur it demonstrates a large right common femoral hematoma with active extravasation.   Patient is status post multiple vascular procedures both endovascular as well as open.  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   Active Medications  No outpatient medications have been marked as taking for the 12/04/23 encounter [facility] Encounter).            Past Medical History:  Diagnosis Date   Acute ST elevation myocardial infarction (STEMI) of inferior wall (HCC) 04/19/2010    a.) transfered from [facility] to [facility] --> LHC/PCI (very difficult procedure) --> 3.0 x 23 mm and 3.0 x 12 mm Xience stents to RCA   Allergies     Arthritis     Benign essential hypertension     Bilateral carotid artery disease (HCC) 05/08/2021    a.) carotid doppler 05/08/2021: 1-39% BICA   CAD (coronary artery disease) 04/19/2010    a.) inferior STEMI 04/19/2010 --> LHC/PCI: 50-70% pD1, 80% pRI, 90/90/90% RCA (overlapping 3.0 x 23 and 3.0 x 12 mm Xience DES); b.) MV 11/10/2018: fixed  minimally reversible inferior perfusion defect   Cellulitis of foot     Chronic HFrEF (heart failure with reduced ejection fraction) (HCC)     DDD (degenerative disc disease), cervical     Diabetes mellitus type 2, insulin  dependent (HCC)     Diverticulosis     Full dentures     Gout     Hard of hearing     History of bilateral cataract extraction 2022   History of ETOH abuse     Hyperlipidemia     Ischemic cardiomyopathy 04/19/2010    a.) TTE 04/19/2010: 40%; b.) TTE 04/20/2014: EF >55%; c.) TTE 11/10/2018: EF 45%;  d.) TTE 11/14/2022: EF 25-30%; e. 06/2023 Echo: EF 25-30%, nl RV size/fxn. Mild MR. Mild-mod TR. Ao sclerosis w/o stenosis.   Long term current use of aspirin      Long term current use of clopidogrel      Lumbar degenerative disc disease     Lumbar radiculopathy     Lumbar vertebral fracture (chronic superior endplate of L1)     NSTEMI (non-ST elevated myocardial infarction) (HCC) 01/15/2023   OSA (obstructive sleep apnea)      a.) unable to tolerate nocturnal PAP therapy   Peripheral artery disease (HCC)      a.) stenting 05/14/21: 12 mm x 12 cm LifeStent RIGHT dis SFA/prox pop; b.) s/p cath directed thrombolysis RIGHT SFA/pop 06/14/21; c.) s/p mech thrombectomy + stenting 06/15/21: 8 mm x 25cm & 8 mm x 7.5cm Viabahn; d.) s/p BILAT CFA, profunda femoris, SFA endarterectomies + fogarty embolectomy + stenting 11/15/22: 12mm x 58mm Lifestream BILAT CIAs, 14 mm x 6 cm Lifestream & 13 mm x 5 cm Viabahn LEFT EIA   Peripheral neuropathy     Umbilical hernia                 Past Surgical History:  Procedure Laterality Date   AMPUTATION Right 11/18/2022    Procedure: AMPUTATION 4TH AND 5TH RAY;  Surgeon: [name], [name];  Location: [facility];  Service: Orthopedics/Podiatry;  Laterality: Right;  4th and 5th toe   AMPUTATION Right 04/20/2023    Procedure: AMPUTATION BELOW KNEE;  Surgeon: [name], MD;  Location: [facility];  Service: Vascular;  Laterality: Right;  block then  general   APPLICATION OF WOUND VAC Right 01/09/2023    Procedure: APPLICATION OF WOUND VAC;  Surgeon: [name], MD;  Location: [facility];  Service: Vascular;  Laterality: Right;   CATARACT EXTRACTION W/PHACO Right 03/14/2021    Procedure: CATARACT EXTRACTION PHACO AND INTRAOCULAR LENS PLACEMENT (IOC) RIGHT DIABETIC;  Surgeon: [name], MD;  Location: [facility];  Service: Ophthalmology;  Laterality: Right;  Diabetic 16.78 01:39.9   CATARACT EXTRACTION W/PHACO Left 03/28/2021    Procedure: CATARACT EXTRACTION PHACO AND INTRAOCULAR LENS PLACEMENT (IOC) LEFT DIABETIC 6.93 01:22.0;  Surgeon: [name], MD;  Location: [facility];  Service: Ophthalmology;  Laterality: Left;  Diabetic   COLONOSCOPY       CORONARY ANGIOPLASTY WITH STENT PLACEMENT   03/2010    Procedure: CORONARY ANGIOPLASTY WITH STENT PLACEMENT; Location: [name]   CORONARY STENT INTERVENTION N/A 07/14/2023    Procedure: CORONARY STENT INTERVENTION;  Surgeon: [name], MD;  Location: [facility];  Service: Cardiovascular;  Laterality: N/A;   ENDARTERECTOMY FEMORAL Bilateral 11/15/2022    Procedure: BILATERAL COMMON FEMORAL PROFUNDA FEMORIS AND SUPERFICIAL FEMORAL ARTERY ENDARTECTOMIES, RIGHT FOGARTY EMBOLECTOMY OF THE RIGHT SFA  AND  POPLITEAL ARTERIES. AORTAGRAM AND RIGHT LOWER EXTREMITY ANGIOGRAM.;  Surgeon: [name], MD;  Location: [facility];  Service: Vascular;  Laterality: Bilateral;   INSERTION OF ILIAC STENT Bilateral 11/15/2022    Procedure: BILATERAL STENT INSERTION IN BILATERAL  COMMON ILIAC ARTERY, STENT INSERTION OF LEFT EXTERNAL ILIAC ARTERY. ANGIOPLASTY RIGHT TIBIAL  AND POPLITEAL ARTERY.;  Surgeon: [name], MD;  Location: [facility];  Service: Vascular;  Laterality: Bilateral;   IRRIGATION AND DEBRIDEMENT KNEE Right 04/21/2023    Procedure: IRRIGATION AND DEBRIDEMENT KNEE;  Surgeon: Zafonte, Brian Efrain, MD;  Location: [facility];  Service: Orthopedics;   Laterality: Right;   LOWER EXTREMITY ANGIOGRAPHY Right 05/14/2021  Procedure: LOWER EXTREMITY ANGIOGRAPHY;  Surgeon: [name], MD;  Location: [facility];  Service: Cardiovascular;  Laterality: Right;   LOWER EXTREMITY ANGIOGRAPHY Right 06/14/2021    Procedure: Lower Extremity Angiography;  Surgeon: [name], MD;  Location: [facility];  Service: Cardiovascular;  Laterality: Right;   LOWER EXTREMITY ANGIOGRAPHY Right 11/11/2022    Procedure: Lower Extremity Angiography;  Surgeon: [name], MD;  Location: [facility];  Service: Cardiovascular;  Laterality: Right;   LOWER EXTREMITY INTERVENTION Right 06/15/2021    Procedure: LOWER EXTREMITY INTERVENTION;  Surgeon: [name], MD;  Location: [facility];  Service: Cardiovascular;  Laterality: Right;   RIGHT/LEFT HEART CATH AND CORONARY ANGIOGRAPHY N/A 07/14/2023    Procedure: RIGHT/LEFT HEART CATH AND CORONARY ANGIOGRAPHY;  Surgeon: [name], MD;  Location: [facility];  Service: Cardiovascular;  Laterality: N/A;   TONSILLECTOMY       TRANSMETATARSAL AMPUTATION Right 02/17/2023    Procedure: TRANSMETATARSAL AMPUTATION;  Surgeon: Lennie Barter, [name];  Location: [facility];  Service: Orthopedics/Podiatry;  Laterality: Right;   WOUND DEBRIDEMENT Right 01/09/2023    Procedure: DEBRIDEMENT WOUND;  Surgeon: [name], MD;  Location: [facility];  Service: Vascular;  Laterality: Right;          Social History Social History  Social History         Tobacco Use   Smoking status: Never      Passive exposure: Never   Smokeless tobacco: Never  Vaping Use   Vaping status: Never Used  Substance Use Topics   Alcohol  use: Yes      Alcohol /week: 21.0 standard drinks of alcohol       Types: 7 Glasses of wine, 14 Cans of beer per week      Comment: occassional   Drug use: No        Family History      Family History  Problem Relation Age of Onset   Scoliosis Mother     Heart  disease Father            Allergies  No Known Allergies       REVIEW OF SYSTEMS (Negative unless checked)   Constitutional: [] Weight loss  [] Fever  [] Chills Cardiac: [] Chest pain   [] Chest pressure   [] Palpitations   [] Shortness of breath when laying flat   [] Shortness of breath with exertion. Vascular:  [x] Pain in legs with walking   [] Pain in legs at rest  [] History of DVT   [] Phlebitis   [] Swelling in legs   [] Varicose veins   [] Non-healing ulcers Pulmonary:   [] Uses home oxygen   [] Productive cough   [] Hemoptysis   [] Wheeze  [] COPD   [] Asthma Neurologic:  [] Dizziness   [] Seizures   [] History of stroke   [] History of TIA  [] Aphasia   [] Vissual changes   [] Weakness or numbness in arm   [] Weakness or numbness in leg Musculoskeletal:   [] Joint swelling   [] Joint pain   [] Low back pain Hematologic:  [] Easy bruising  [] Easy bleeding   [] Hypercoagulable state   [] Anemic Gastrointestinal:  [] Diarrhea   [] Vomiting  [] Gastroesophageal reflux/heartburn   [] Difficulty swallowing. Genitourinary:  [] Chronic kidney disease   [] Difficult urination  [] Frequent urination   [] Blood in urine Skin:  [] Rashes   [] Ulcers  Psychological:  [] History of anxiety   []  History of major depression.   Physical Examination         Vitals:    12/04/23  1551 12/04/23 1613 12/04/23 1616 12/04/23 1638  BP:   123/84   123/84  Pulse:     87 87  Resp:       19  Temp: 98.7 F (37.1 C)     98.7 F (37.1 C)  TempSrc: Oral     Oral  SpO2:     93% 93%  Weight:          Height:            Body mass index is 27.37 kg/m. Gen: WD/WN, NAD Head: [place]/AT, No temporalis wasting.  Ear/Nose/Throat: Hearing grossly intact, nares w/o erythema or drainage Eyes: PER, EOMI, sclera nonicteric.  Neck: Supple, no masses.  No bruit or JVD.  Pulmonary:  Good air movement, no audible wheezing, no use of accessory muscles.  Cardiac: RRR, normal S1, S2, no Murmurs. Vascular:  trophic changes, no open wounds right groin mildly tender  to palpation with a pulsatile mass Vessel Right Left  Radial Palpable Palpable  PT BKA Not Palpable  DP BKA Not Palpable  Gastrointestinal: soft, non-distended. No guarding/no peritoneal signs.  Musculoskeletal: M/S 5/5 throughout.  No visible deformity.  Neurologic: CN 2-12 intact. Pain and light touch intact in extremities.  Symmetrical.  Speech is fluent. Motor exam as listed above. Psychiatric: Judgment intact, Mood & affect appropriate for pt's clinical situation. Dermatologic: No rashes or ulcers noted.  No changes consistent with cellulitis.     CBC Recent Labs       Lab Results  Component Value Date    WBC 11.6 (H) 12/04/2023    HGB 14.3 12/04/2023    HCT 42.9 12/04/2023    MCV 84.6 12/04/2023    PLT 191 12/04/2023        BMET Labs (Brief)          Component Value Date/Time    NA 126 (L) 12/04/2023 1141    NA 137 07/21/2023 1246    NA 137 02/16/2012 2013    K 5.3 (H) 12/04/2023 1141    K 3.8 02/16/2012 2013    CL 91 (L) 12/04/2023 1141    CL 102 02/16/2012 2013    CO2 23 12/04/2023 1141    CO2 25 02/16/2012 2013    GLUCOSE 438 (H) 12/04/2023 1141    GLUCOSE 154 (H) 02/16/2012 2013    BUN 18 12/04/2023 1141    BUN 30 (H) 07/21/2023 1246    BUN 10 02/16/2012 2013    CREATININE 1.12 12/04/2023 1141    CREATININE 0.72 02/16/2012 2013    CALCIUM  8.9 12/04/2023 1141    CALCIUM  9.6 02/16/2012 2013    GFRNONAA >60 12/04/2023 1141    GFRNONAA >60 02/16/2012 2013    GFRAA >60 12/09/2018 0929    GFRAA >60 02/16/2012 2013      Estimated Creatinine Clearance: 50.9 mL/min (by C-G formula based on SCr of 1.12 mg/dL).   COAG Recent Labs       Lab Results  Component Value Date    INR 1.4 (H) 07/10/2023    INR 1.4 (H) 07/09/2023    INR 1.4 (H) 04/19/2023        Radiology  Imaging Results  CT CHEST ABDOMEN PELVIS W CONTRAST Result Date: 12/04/2023 CLINICAL DATA:  Sepsis.  Fall. EXAM: CT CHEST, ABDOMEN, AND PELVIS WITH CONTRAST TECHNIQUE: Multidetector CT  imaging of the chest, abdomen and pelvis was performed following the standard protocol during bolus administration of intravenous contrast. RADIATION DOSE REDUCTION: This exam was performed according to  the departmental dose-optimization program which includes automated exposure control, adjustment of the mA and/or kV according to patient size and/or use of iterative reconstruction technique. CONTRAST:  OMNIPAQUE  IOHEXOL  300 MG/ML  SOLN COMPARISON:  CT abdomen and pelvis 06/16/2021. FINDINGS: CT CHEST FINDINGS Cardiovascular: The heart is mildly enlarged. There is no pericardial effusion. There are atherosclerotic calcifications of the aorta and coronary arteries. Aorta is normal in size. Mediastinum/Nodes: No enlarged mediastinal, hilar, or axillary lymph nodes. Thyroid gland, trachea, and esophagus demonstrate no significant findings. Lungs/Pleura: There are mild patchy peripheral ground-glass opacities in the anterior right upper lobe with some focal pleural thickening. There are calcified granulomas bilaterally. The lungs are otherwise clear. There is no pleural effusion or pneumothorax. Musculoskeletal: There is some expansile cystic changes in the right glenoid and non expansile cystic changes in the left lung is cleared these are incompletely imaged. No other focal osseous lesions are identified. There is no acute fracture or dislocation identified. Multilevel degenerative changes affect the spine. There are healed right fourth through seventh rib fractures. CT ABDOMEN PELVIS FINDINGS Hepatobiliary: There is diffuse fatty infiltration of the liver. There is gallbladder wall edema. No radiopaque calculi are seen. There is no biliary ductal dilatation. Pancreas: Unremarkable. No pancreatic ductal dilatation or surrounding inflammatory changes. Spleen: Spleen is mildly enlarged. Adrenals/Urinary Tract: There is a 3 mm calculus in the distal right ureter. There is no right-sided hydronephrosis. Otherwise,  the bilateral kidneys, adrenal glands and bladder are within normal limits. Stomach/Bowel: Stomach is within normal limits. Appendix appears normal. No evidence of bowel wall thickening, distention, or inflammatory changes. There is diffuse colonic diverticulosis, most significant in the sigmoid colon and descending colon. Vascular/Lymphatic: Infrarenal abdominal aortic aneurysm measures 3.2 cm similar to prior. There is severe atherosclerotic calcifications of the aorta. IVC is normal in size. There is a vascular stent in the left external iliac artery. There is hematoma surrounding the right common femoral artery measuring 6.6 x 4.9 by 7.0 cm. There is active extravasation of a large amount of contrast into the central hematoma with source at the anterior margin of the right common femoral artery. There is some scarring surrounding the left common femoral artery. No enlarged lymph nodes are identified. Reproductive: Prostate gland is mildly enlarged. Other: There is a small fat containing umbilical hernia. There is no ascites. There is no retroperitoneal hematoma. Musculoskeletal: There is severe degenerative changes throughout the spine. Chronic L1 compression fracture is unchanged. IMPRESSION: 1. Large hematoma surrounding the right common femoral artery with active arterial extravasation of contrast into the hematoma. 2. 3.2 cm infrarenal abdominal aortic aneurysm. Recommend follow-up every 3 years. 3. Mild patchy peripheral ground-glass opacities in the anterior right upper lobe with some focal pleural thickening. Findings are nonspecific and may be infectious/inflammatory. 4. Gallbladder wall edema. No radiopaque calculi are seen. Correlate clinically for cholecystitis. 5. Nonobstructing 3 mm calculus in the distal right ureter. 6. Colonic diverticulosis. 7. Fatty infiltration of the liver. 8. Mild splenomegaly. Aortic Atherosclerosis (ICD10-I70.0). These results were called by telephone at the time of  interpretation on 12/04/2023 at 5:49 pm to provider [facility] , who verbally acknowledged these results. Electronically Signed   By: Greig Pique M.D.   On: 12/04/2023 17:49    CT Head Wo Contrast Result Date: 12/04/2023 CLINICAL DATA:  Status post fall. EXAM: CT HEAD WITHOUT CONTRAST TECHNIQUE: Contiguous axial images were obtained from the base of the skull through the vertex without intravenous contrast. RADIATION DOSE REDUCTION: This exam was  performed according to the departmental dose-optimization program which includes automated exposure control, adjustment of the mA and/or kV according to patient size and/or use of iterative reconstruction technique. COMPARISON:  April 09, 2021 FINDINGS: Brain: There is generalized cerebral atrophy with widening of the extra-axial spaces and ventricular dilatation. There are areas of decreased attenuation within the white matter tracts of the supratentorial brain, consistent with microvascular disease changes. Vascular: Marked severity bilateral cavernous carotid artery calcification is noted. Skull: Normal. Negative for fracture or focal lesion. Sinuses/Orbits: No acute finding. Other: None. IMPRESSION: 1. Generalized cerebral atrophy and microvascular disease changes of the supratentorial brain. 2. No acute intracranial abnormality. Electronically Signed   By: Suzen Dials M.D.   On: 12/04/2023 17:44    DG Shoulder Right Result Date: 12/04/2023 CLINICAL DATA:  Status post fall. EXAM: RIGHT SHOULDER - 2+ VIEW COMPARISON:  None Available. FINDINGS: There is no evidence of an acute fracture or dislocation. A chronic appearing deformity is seen along the inferior medial aspect of the right humeral head and neck. Multiple chronic right-sided rib fractures are noted. Marked severity degenerative changes are seen involving the right glenohumeral joint. Extensive chronic and degenerative changes are also present involving the right glenohumeral joint, in the  form of joint space narrowing and extensive humeral and glenoid subchondral cyst formation. Soft tissues are unremarkable. IMPRESSION: Chronic and degenerative changes, as described above, without an acute osseous abnormality. Electronically Signed   By: Suzen Dials M.D.   On: 12/04/2023 17:41    CT Cervical Spine Wo Contrast Result Date: 12/04/2023 CLINICAL DATA:  Status post fall. EXAM: CT CERVICAL SPINE WITHOUT CONTRAST TECHNIQUE: Multidetector CT imaging of the cervical spine was performed without intravenous contrast. Multiplanar CT image reconstructions were also generated. RADIATION DOSE REDUCTION: This exam was performed according to the departmental dose-optimization program which includes automated exposure control, adjustment of the mA and/or kV according to patient size and/or use of iterative reconstruction technique. COMPARISON:  None Available. FINDINGS: Alignment: There is straightening of the normal cervical spine lordosis. Approximately 2 mm anterolisthesis of the C4 vertebral body is noted on C5. Skull base and vertebrae: No acute fracture. No primary bone lesion or focal pathologic process. Chronic and degenerative changes are seen involving the body and tip of the dens, as well as the adjacent portion of the anterior arch of C1. Soft tissues and spinal canal: No prevertebral fluid or swelling. No visible canal hematoma. Disc levels: Marked severity endplate sclerosis, anterior osteophyte formation and posterior bony spurring are seen at the levels of C4-C5, C5-C6, C6-C7 and C7-T1. Posterior bony spurring is also present at the level of C3-C4. There is marked severity narrowing of the anterior atlantoaxial articulation. Marked severity intervertebral disc space narrowing is seen at C4-C5, C5-C6, C6-C7 and C7-T1. Bilateral marked severity multilevel facet joint hypertrophy is noted. Upper chest: Negative. Other: None. IMPRESSION: 1. No acute fracture or traumatic subluxation. 2. Marked  severity multilevel degenerative changes, as described above. Electronically Signed   By: Suzen Dials M.D.   On: 12/04/2023 17:36    DG Chest 2 View Result Date: 12/04/2023 CLINICAL DATA:  Weakness. EXAM: DG CHEST 2V COMPARISON:  07/09/2023. FINDINGS: Stable cardiomegaly. Aortic atherosclerosis. No focal consolidation, pleural effusion, or pneumothorax. Remote healed right-sided rib fractures. Advanced degenerative changes of the bilateral glenohumeral joints. No acute osseous abnormality identified. IMPRESSION: 1. No acute cardiopulmonary findings. 2. Stable cardiomegaly. 3.  Aortic Atherosclerosis (ICD10-I70.0). Electronically Signed   By: Harrietta Sherry M.D.   On: 12/04/2023  12:34         Assessment/Plan 1.  Right common femoral artery hematoma with active extravasation: Patient will require emergent angiography with intervention.  Plan will be for treating the common femoral with a covered stent extending into the profunda femoris from the external iliac.  CT angiography also demonstrates his SFA is occluded consistent finding given his BKA.   Risks and benefits have been reviewed.  Patient is quite familiar as he has had numerous vascular angiography and interventions.  All questions have been answered patient agrees to proceed.   2.  Coronary artery disease: Continue cardiac and antihypertensive medications as already ordered and reviewed, no changes at this time.  Of note he does have elevated troponins and we will plan to continue heparin  and continue to workup his cardiac status postintervention.  However, given the active extravasation I will move forward with angiography and intervention urgently.   Continue statin as ordered and reviewed, no changes at this time   Nitrates PRN for chest pain   3.  Insulin -dependent diabetes: Continue hypoglycemic medications as already ordered, these medications have been reviewed and there are no changes at this time.   Hgb A1C to be  monitored as already arranged by primary service   4.  Hypertension: Continue antihypertensive medications as already ordered, these medications have been reviewed and there are no changes at this time.   5.  Hypercholesterolemia: Continue statin as ordered and reviewed, no changes at this time     Cordella Shawl, MD   12/04/2023 6:51 PM

## 2023-12-04 NOTE — Op Note (Signed)
 [facility]  Percutaneous Study/Intervention Procedural Note   Date of Surgery: 12/04/2023  Surgeon:  Cordella JUDITHANN Shawl, MD.  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  Introduction catheter into right lower extremity 3rd order catheter placement              2.    Contrast injection right lower extremity for distal runoff             3.  Percutaneous transluminal angioplasty and stent placement right profunda femoris and common femoral artery extending into the distal right external iliac artery             4.  Celt closure left common femoral arteriotomy  Anesthesia: Conscious sedation was administered under my direct supervision by the interventional radiology RN. IV Versed  plus fentanyl  were utilized. Continuous ECG, pulse oximetry and blood pressure was monitored throughout the entire procedure.  Conscious sedation was for a total of 48 minutes.  Sheath: 7 [place] Ansell left common femoral retrograde  Contrast: 35 cc  Fluoroscopy Time: 6.3 minutes  Indications:  Manuel [name] presents with rupture of the right common femoral artery with large hematoma and active extravasation.  Angiography with the hope for intervention is recommended as a lifesaving procedure.  The risks and benefits are reviewed all questions answered patient agrees to proceed.  Procedure:  Manuel [name] is a 78 y.o. y.o. male who was identified and appropriate procedural time out was performed.  The patient was then placed supine on the table and prepped and draped in the usual sterile fashion.    Ultrasound was placed in the sterile sleeve and the left groin was evaluated the left common femoral artery was echolucent and pulsatile indicating  patency.  Image was recorded for the permanent record and under real-time visualization a microneedle was inserted into the common femoral artery microwire followed by a micro-sheath.  An Amplatz wire was then advanced through the micro-sheath and a 6 French sheath was then inserted over the Amplatz wire.  Using a rim catheter advanced over the Amplatz wire the rim catheter was used to hook the aortic bifurcation.  An advantage wire was then advanced down into the right common femoral and the rim catheter advanced into the distal external iliac artery.  Wire was removed and hand-injection of contrast in AP as well as an RAO projection was obtained.  Using this oblique image the advantage wire was reintroduced and negotiated into the distal profunda femoris.  A 7 French Ansell sheath was then advanced up and over the aortic bifurcation position with the tip in the mid right external iliac artery.  Kumpe catheter was then advanced over the wire and the catheter was advanced into the mid profunda femoris hand-injection contrast was used to demonstrate the distal profunda.  V18 wire was then advanced through the Kumpe catheter.  6000 units of heparin  was then given and allowed to circulate.  I then selected a 6 mm x 50 mm Viabahn stent and deployed this into the profunda femoris.  Next an 8 mm x 75 mm Viabahn stent was deployed overlapping the 6 mm Viabahn stent by approximately 20 mm and extending it into the distal external iliac artery.  The profunda femoris portion was then postdilated  with a 6 mm x 80 mm Ultraverse balloon inflated to 12 atm for approximately 1 minute.  An 8 mm x 60 mm Ultraverse balloon was then used to treat the proximal portion of the Viabahn stent located within the external iliac artery.  Inflation was to 10 atm for approximate 1 minute.  Follow-up imaging was then performed through the sheath and demonstrated complete exclusion of the common femoral artery rupture with wide patency of the  profunda femoris.  No further bleeding is identified and there is excellent apposition of the Viabahn stents to the walls of the arteries both proximally and distally.  After review of these images the sheath is pulled into the left external iliac oblique of the common femoral is obtained.  The Ansell sheath is then exchanged for an 11 cm 7 [place] Pinnacle sheath and a Celt device deployed. There no immediate complications.   Findings:   The right common femoral is patent but there is extensive extravasation easily visualized on contrast injection.  The profunda femoris is widely patent with extensive collaterals down to the knee with the exception of a greater than 70% ostial lesion.  The SFA is occluded.  The right external iliac artery is widely patent and measures approximately 7 to 8 mm in diameter.    Following deployment of the Viabahn stents as described above and then postdilatation with a 6 mm balloon in the profunda femoris portion there is now wide patency with complete exclusion of the extravasation.  There is excellent apposition of the stents proximally and distally to the arterial wall.  There is actually a dramatic improvement with less than 10% residual stenosis at the ostia of the profunda femoris.    Summary: Successful treatment of acute rupture of the right common femoral artery as well as treatment of a critical stenosis at the ostia of the profunda femoris.                         Disposition: Patient was taken to the recovery room in stable condition having tolerated the procedure well.  [name] 12/04/2023,8:13 PM

## 2023-12-05 ENCOUNTER — Encounter: Payer: Self-pay | Admitting: Vascular Surgery

## 2023-12-05 ENCOUNTER — Inpatient Hospital Stay (HOSPITAL_COMMUNITY)
Admit: 2023-12-05 | Discharge: 2023-12-05 | Disposition: A | Discharge status: Home / Self Care | Attending: Pulmonary Disease | Admitting: Pulmonary Disease

## 2023-12-05 ENCOUNTER — Inpatient Hospital Stay

## 2023-12-05 DIAGNOSIS — Z89511 Acquired absence of right leg below knee: Secondary | ICD-10-CM | POA: Diagnosis not present

## 2023-12-05 DIAGNOSIS — I5022 Chronic systolic (congestive) heart failure: Secondary | ICD-10-CM

## 2023-12-05 DIAGNOSIS — Z95828 Presence of other vascular implants and grafts: Secondary | ICD-10-CM

## 2023-12-05 DIAGNOSIS — S75091A Other specified injury of femoral artery, right leg, initial encounter: Secondary | ICD-10-CM | POA: Diagnosis not present

## 2023-12-05 DIAGNOSIS — I2510 Atherosclerotic heart disease of native coronary artery without angina pectoris: Secondary | ICD-10-CM

## 2023-12-05 DIAGNOSIS — Z9889 Other specified postprocedural states: Secondary | ICD-10-CM

## 2023-12-05 DIAGNOSIS — R7989 Other specified abnormal findings of blood chemistry: Secondary | ICD-10-CM

## 2023-12-05 DIAGNOSIS — E785 Hyperlipidemia, unspecified: Secondary | ICD-10-CM

## 2023-12-05 DIAGNOSIS — I70203 Unspecified atherosclerosis of native arteries of extremities, bilateral legs: Secondary | ICD-10-CM | POA: Diagnosis not present

## 2023-12-05 DIAGNOSIS — I772 Rupture of artery: Secondary | ICD-10-CM | POA: Diagnosis not present

## 2023-12-05 DIAGNOSIS — I10 Essential (primary) hypertension: Secondary | ICD-10-CM

## 2023-12-05 DIAGNOSIS — I739 Peripheral vascular disease, unspecified: Secondary | ICD-10-CM

## 2023-12-05 LAB — BLOOD CULTURE ID PANEL (REFLEXED) - BCID2

## 2023-12-05 LAB — COMPREHENSIVE METABOLIC PANEL WITH GFR
ALT: 17 U/L (ref 0–44)
AST: 42 U/L — ABNORMAL HIGH (ref 15–41)
Albumin: 2.7 g/dL — ABNORMAL LOW (ref 3.5–5.0)
Alkaline Phosphatase: 65 U/L (ref 38–126)
Anion gap: 8 (ref 5–15)
BUN: 27 mg/dL — ABNORMAL HIGH (ref 8–23)
CO2: 21 mmol/L — ABNORMAL LOW (ref 22–32)
Calcium: 8.1 mg/dL — ABNORMAL LOW (ref 8.9–10.3)
Chloride: 97 mmol/L — ABNORMAL LOW (ref 98–111)
Creatinine, Ser: 1.37 mg/dL — ABNORMAL HIGH (ref 0.61–1.24)
GFR, Estimated: 52 mL/min — ABNORMAL LOW (ref >60)
Glucose, Bld: 296 mg/dL — ABNORMAL HIGH (ref 70–99)
Potassium: 5.6 mmol/L — ABNORMAL HIGH (ref 3.5–5.1)
Sodium: 126 mmol/L — ABNORMAL LOW (ref 135–145)
Total Bilirubin: 1.7 mg/dL — ABNORMAL HIGH (ref 0.0–1.2)
Total Protein: 5.9 g/dL — ABNORMAL LOW (ref 6.5–8.1)

## 2023-12-05 LAB — CBC
HCT: 34.2 % — ABNORMAL LOW (ref 39.0–52.0)
Hemoglobin: 11.5 g/dL — ABNORMAL LOW (ref 13.0–17.0)
MCH: 28.8 pg (ref 26.0–34.0)
MCHC: 33.6 g/dL (ref 30.0–36.0)
MCV: 85.5 fL (ref 80.0–100.0)
Platelets: 152 K/uL (ref 150–400)
RBC: 4 MIL/uL — ABNORMAL LOW (ref 4.22–5.81)
RDW: 15.5 % (ref 11.5–15.5)
WBC: 14.5 K/uL — ABNORMAL HIGH (ref 4.0–10.5)
nRBC: 0 % (ref 0.0–0.2)

## 2023-12-05 LAB — BASIC METABOLIC PANEL WITH GFR
Anion gap: 13 (ref 5–15)
Anion gap: 11 (ref 5–15)
Anion gap: 10 (ref 5–15)
BUN: 26 mg/dL — ABNORMAL HIGH (ref 8–23)
BUN: 31 mg/dL — ABNORMAL HIGH (ref 8–23)
BUN: 30 mg/dL — ABNORMAL HIGH (ref 8–23)
CO2: 20 mmol/L — ABNORMAL LOW (ref 22–32)
CO2: 21 mmol/L — ABNORMAL LOW (ref 22–32)
CO2: 19 mmol/L — ABNORMAL LOW (ref 22–32)
Calcium: 8.3 mg/dL — ABNORMAL LOW (ref 8.9–10.3)
Calcium: 8 mg/dL — ABNORMAL LOW (ref 8.9–10.3)
Calcium: 8.3 mg/dL — ABNORMAL LOW (ref 8.9–10.3)
Chloride: 98 mmol/L (ref 98–111)
Chloride: 98 mmol/L (ref 98–111)
Chloride: 101 mmol/L (ref 98–111)
Creatinine, Ser: 1.44 mg/dL — ABNORMAL HIGH (ref 0.61–1.24)
Creatinine, Ser: 1.45 mg/dL — ABNORMAL HIGH (ref 0.61–1.24)
Creatinine, Ser: 1.35 mg/dL — ABNORMAL HIGH (ref 0.61–1.24)
GFR, Estimated: 49 mL/min — ABNORMAL LOW (ref >60)
GFR, Estimated: 49 mL/min — ABNORMAL LOW (ref >60)
GFR, Estimated: 53 mL/min — ABNORMAL LOW (ref >60)
Glucose, Bld: 234 mg/dL — ABNORMAL HIGH (ref 70–99)
Glucose, Bld: 143 mg/dL — ABNORMAL HIGH (ref 70–99)
Glucose, Bld: 87 mg/dL (ref 70–99)
Potassium: 5.4 mmol/L — ABNORMAL HIGH (ref 3.5–5.1)
Potassium: 4.5 mmol/L (ref 3.5–5.1)
Potassium: 4.6 mmol/L (ref 3.5–5.1)
Sodium: 131 mmol/L — ABNORMAL LOW (ref 135–145)
Sodium: 130 mmol/L — ABNORMAL LOW (ref 135–145)
Sodium: 130 mmol/L — ABNORMAL LOW (ref 135–145)

## 2023-12-05 LAB — PROCALCITONIN: Procalcitonin: 9.58 ng/mL

## 2023-12-05 LAB — BLOOD GAS, VENOUS
Acid-base deficit: 1.5 mmol/L (ref 0.0–2.0)
Bicarbonate: 22.9 mmol/L (ref 20.0–28.0)
O2 Saturation: 71.8 %
Patient temperature: 37
pCO2, Ven: 37 mmHg — ABNORMAL LOW (ref 44–60)
pH, Ven: 7.4 (ref 7.25–7.43)
pO2, Ven: 40 mmHg (ref 32–45)

## 2023-12-05 LAB — GLUCOSE, CAPILLARY
Glucose-Capillary: 420 mg/dL — ABNORMAL HIGH (ref 70–99)
Glucose-Capillary: 356 mg/dL — ABNORMAL HIGH (ref 70–99)
Glucose-Capillary: 310 mg/dL — ABNORMAL HIGH (ref 70–99)
Glucose-Capillary: 225 mg/dL — ABNORMAL HIGH (ref 70–99)
Glucose-Capillary: 181 mg/dL — ABNORMAL HIGH (ref 70–99)
Glucose-Capillary: 93 mg/dL (ref 70–99)
Glucose-Capillary: 108 mg/dL — ABNORMAL HIGH (ref 70–99)
Glucose-Capillary: 56 mg/dL — ABNORMAL LOW (ref 70–99)
Glucose-Capillary: 65 mg/dL — ABNORMAL LOW (ref 70–99)
Glucose-Capillary: 131 mg/dL — ABNORMAL HIGH (ref 70–99)

## 2023-12-05 LAB — OSMOLALITY: Osmolality: 280 mosm/kg (ref 275–295)

## 2023-12-05 LAB — LACTIC ACID, PLASMA: Lactic Acid, Venous: 2.9 mmol/L (ref 0.5–1.9)

## 2023-12-05 LAB — HEPARIN LEVEL (UNFRACTIONATED)
Heparin Unfractionated: <0.1 [IU]/mL — ABNORMAL LOW (ref 0.30–0.70)
Heparin Unfractionated: <0.1 [IU]/mL — ABNORMAL LOW (ref 0.30–0.70)

## 2023-12-05 LAB — TROPONIN I (HIGH SENSITIVITY): Troponin I (High Sensitivity): 7607 ng/L (ref <18)

## 2023-12-05 MED ORDER — FOLIC ACID 1 MG PO TABS
1.0000 mg | ORAL_TABLET | Freq: Every day | ORAL | Status: DC
  Administered 2023-12-05 – 2024-01-01 (×23): 1 mg via ORAL
  Filled 2023-12-05 (×25): qty 1

## 2023-12-05 MED ORDER — VANCOMYCIN HCL 2000 MG/400ML IV SOLN
2000.0000 mg | Freq: Once | INTRAVENOUS | Status: AC
  Administered 2023-12-05: 2000 mg via INTRAVENOUS
  Filled 2023-12-05: qty 400

## 2023-12-05 MED ORDER — HEPARIN BOLUS VIA INFUSION
2500.0000 [IU] | Freq: Once | INTRAVENOUS | Status: AC
  Administered 2023-12-05: 2500 [IU] via INTRAVENOUS
  Filled 2023-12-05: qty 2500

## 2023-12-05 MED ORDER — LORAZEPAM 1 MG PO TABS: 1.0000 mg | ORAL_TABLET | ORAL | Status: AC | PRN

## 2023-12-05 MED ORDER — MIDODRINE HCL 5 MG PO TABS: 5.0000 mg | ORAL_TABLET | Freq: Three times a day (TID) | ORAL | Status: DC | PRN

## 2023-12-05 MED ORDER — THIAMINE MONONITRATE 100 MG PO TABS
100.0000 mg | ORAL_TABLET | Freq: Every day | ORAL | Status: DC
  Administered 2023-12-05 – 2024-01-01 (×23): 100 mg via ORAL
  Filled 2023-12-05 (×25): qty 1

## 2023-12-05 MED ORDER — SODIUM ZIRCONIUM CYCLOSILICATE 5 G PO PACK
10.0000 g | PACK | Freq: Three times a day (TID) | ORAL | Status: AC
  Administered 2023-12-05 (×2): 10 g via ORAL
  Filled 2023-12-05 (×2): qty 2

## 2023-12-05 MED ORDER — CLOPIDOGREL BISULFATE 75 MG PO TABS
75.0000 mg | ORAL_TABLET | Freq: Every day | ORAL | Status: DC
  Administered 2023-12-06 – 2023-12-10 (×5): 75 mg via ORAL
  Filled 2023-12-05 (×5): qty 1

## 2023-12-05 MED ORDER — SODIUM CHLORIDE 0.9 % IV BOLUS
250.0000 mL | Freq: Once | INTRAVENOUS | Status: AC
  Administered 2023-12-05: 250 mL via INTRAVENOUS

## 2023-12-05 MED ORDER — SODIUM CHLORIDE 0.9 % IV SOLN
2.0000 g | Freq: Two times a day (BID) | INTRAVENOUS | Status: DC
  Administered 2023-12-05 – 2023-12-07 (×5): 2 g via INTRAVENOUS
  Filled 2023-12-05 (×7): qty 12.5

## 2023-12-05 MED ORDER — CLOPIDOGREL BISULFATE 75 MG PO TABS
300.0000 mg | ORAL_TABLET | Freq: Once | ORAL | Status: AC
  Administered 2023-12-05: 300 mg via ORAL
  Filled 2023-12-05: qty 4

## 2023-12-05 MED ORDER — SODIUM CHLORIDE 0.9 % IV SOLN
500.0000 mg | INTRAVENOUS | Status: DC
  Administered 2023-12-05: 500 mg via INTRAVENOUS
  Filled 2023-12-05: qty 5

## 2023-12-05 MED ORDER — LACTATED RINGERS IV SOLN: INTRAVENOUS | Status: AC

## 2023-12-05 MED ORDER — INSULIN ASPART 100 UNIT/ML IJ SOLN
6.0000 [IU] | Freq: Once | INTRAMUSCULAR | Status: AC
  Administered 2023-12-05: 6 [IU] via SUBCUTANEOUS
  Filled 2023-12-05: qty 1

## 2023-12-05 MED ORDER — VANCOMYCIN HCL 1250 MG/250ML IV SOLN: 1250.0000 mg | INTRAVENOUS | Status: DC

## 2023-12-05 MED ORDER — MIDODRINE HCL 5 MG PO TABS
10.0000 mg | ORAL_TABLET | Freq: Three times a day (TID) | ORAL | Status: DC
  Administered 2023-12-05 – 2023-12-06 (×2): 10 mg via ORAL
  Filled 2023-12-05 (×3): qty 2

## 2023-12-05 MED ORDER — SODIUM BICARBONATE 8.4 % IV SOLN
100.0000 meq | Freq: Once | INTRAVENOUS | Status: AC
  Administered 2023-12-05: 100 meq via INTRAVENOUS
  Filled 2023-12-05: qty 50

## 2023-12-05 MED ORDER — GLUCOSE 40 % PO GEL
1.0000 | ORAL | Status: AC
  Administered 2023-12-05: 31 g via ORAL
  Filled 2023-12-05: qty 1

## 2023-12-05 MED ORDER — INSULIN ASPART 100 UNIT/ML IJ SOLN
0.0000 [IU] | INTRAMUSCULAR | Status: DC
  Administered 2023-12-05: 8 [IU] via SUBCUTANEOUS
  Administered 2023-12-05 – 2023-12-06 (×2): 3 [IU] via SUBCUTANEOUS
  Administered 2023-12-06: 5 [IU] via SUBCUTANEOUS
  Administered 2023-12-06 (×3): 3 [IU] via SUBCUTANEOUS
  Administered 2023-12-07: 5 [IU] via SUBCUTANEOUS
  Administered 2023-12-07 (×2): 3 [IU] via SUBCUTANEOUS
  Administered 2023-12-08 – 2023-12-09 (×4): 2 [IU] via SUBCUTANEOUS
  Administered 2023-12-10: 3 [IU] via SUBCUTANEOUS
  Administered 2023-12-10: 5 [IU] via SUBCUTANEOUS
  Administered 2023-12-11: 2 [IU] via SUBCUTANEOUS
  Administered 2023-12-11 – 2023-12-12 (×2): 3 [IU] via SUBCUTANEOUS
  Administered 2023-12-12: 2 [IU] via SUBCUTANEOUS
  Administered 2023-12-12 – 2023-12-14 (×5): 3 [IU] via SUBCUTANEOUS
  Administered 2023-12-14 – 2023-12-15 (×5): 2 [IU] via SUBCUTANEOUS
  Administered 2023-12-15 – 2023-12-16 (×2): 3 [IU] via SUBCUTANEOUS
  Administered 2023-12-17: 2 [IU] via SUBCUTANEOUS
  Administered 2023-12-18: 8 [IU] via SUBCUTANEOUS
  Administered 2023-12-18: 3 [IU] via SUBCUTANEOUS
  Administered 2023-12-18 (×2): 5 [IU] via SUBCUTANEOUS
  Administered 2023-12-19 – 2023-12-21 (×6): 2 [IU] via SUBCUTANEOUS
  Administered 2023-12-22 – 2023-12-23 (×2): 3 [IU] via SUBCUTANEOUS
  Administered 2023-12-24: 2 [IU] via SUBCUTANEOUS
  Filled 2023-12-05 (×49): qty 1

## 2023-12-05 MED ORDER — ACETYLCYSTEINE 20 % IN SOLN
600.0000 mg | Freq: Two times a day (BID) | RESPIRATORY_TRACT | Status: DC
  Administered 2023-12-05 – 2023-12-06 (×2): 600 mg via ORAL
  Filled 2023-12-05 (×5): qty 4

## 2023-12-05 MED ORDER — PERFLUTREN LIPID MICROSPHERE
1.0000 mL | INTRAVENOUS | Status: AC | PRN
  Administered 2023-12-05: 2 mL via INTRAVENOUS

## 2023-12-05 MED ORDER — ADULT MULTIVITAMIN W/MINERALS CH
1.0000 | ORAL_TABLET | Freq: Every day | ORAL | Status: DC
  Administered 2023-12-05 – 2023-12-16 (×8): 1 via ORAL
  Filled 2023-12-05 (×10): qty 1

## 2023-12-05 MED ORDER — THIAMINE HCL 100 MG/ML IJ SOLN
100.0000 mg | Freq: Every day | INTRAMUSCULAR | Status: DC
  Filled 2023-12-05 (×4): qty 2

## 2023-12-05 MED ORDER — DEXTROSE-SODIUM CHLORIDE 5-0.9 % IV SOLN: INTRAVENOUS | Status: AC

## 2023-12-05 MED ORDER — LORAZEPAM 2 MG/ML IJ SOLN
1.0000 mg | INTRAMUSCULAR | Status: AC | PRN
  Filled 2023-12-05: qty 2

## 2023-12-05 NOTE — Progress Notes (Signed)
 [facility]  Daily Progress Note   Subjective  - 1 Day Post-Op  Patient is sleeping comfortably when I enter.  Upon awakening him he states he is feeling better with much less pain and was actually able to get some sleep last night.  Objective Vitals:   12/05/23 0254 12/05/23 0400 12/05/23 0639 12/05/23 0700  BP:  91/69 96/73 (!) 81/51  Pulse: 79  71 69  Resp:  14 13 13   Temp:  98.4 F (36.9 C)    TempSrc:  Oral    SpO2:  96% 100% 99%  Weight:      Height:        Intake/Output Summary (Last 24 hours) at 12/05/2023 1845 Last data filed at 12/05/2023 0900 Gross per 24 hour  Intake 0 ml  Output 50 ml  Net -50 ml    Pulm  Normal effort , no use of accessory muscles CV  No JVD, RRR Abd      No distended, nontender VASC  right groin is ecchymotic and firm but there is no evidence for skin breakdown.  Right BKA stump warm and well-perfused  Laboratory CBC    Component Value Date/Time   WBC 14.5 (H) 12/05/2023 0519   HGB 11.5 (L) 12/05/2023 0519   HGB 15.7 02/16/2012 2013   HCT 34.2 (L) 12/05/2023 0519   HCT 44.4 02/16/2012 2013   PLT 152 12/05/2023 0519   PLT 176 02/16/2012 2013    BMET    Component Value Date/Time   NA 130 (L) 12/05/2023 1643   NA 137 07/21/2023 1246   NA 137 02/16/2012 2013   K 4.6 12/05/2023 1643   K 3.8 02/16/2012 2013   CL 101 12/05/2023 1643   CL 102 02/16/2012 2013   CO2 19 (L) 12/05/2023 1643   CO2 25 02/16/2012 2013   GLUCOSE 87 12/05/2023 1643   GLUCOSE 154 (H) 02/16/2012 2013   BUN 30 (H) 12/05/2023 1643   BUN 30 (H) 07/21/2023 1246   BUN 10 02/16/2012 2013   CREATININE 1.35 (H) 12/05/2023 1643   CREATININE 0.72 02/16/2012 2013   CALCIUM  8.3 (L) 12/05/2023 1643   CALCIUM  9.6 02/16/2012 2013   GFRNONAA 53 (L) 12/05/2023 1643   GFRNONAA >60 02/16/2012 2013   GFRAA >60 12/09/2018 0929   GFRAA >60 02/16/2012 2013    Assessment/Planning: POD # 1 s/p stenting of the right common femoral artery for  treatment of hemorrhage in addition to treating critical ostial stenosis of the profunda femoris  Okay to continue heparin  for now.  It is okay per vascular to transition back to oral agents when cardiology feels it is appropriate.  Continue to manage his electrolytes.  Today his sodium although still low is significantly improved and his potassium is now normalized.  His glucose is also markedly improved and his hemoglobin is stable at 11.5.  Continue critical care for now.   Cordella Shawl  12/05/2023, 6:45 PM

## 2023-12-05 NOTE — Progress Notes (Signed)
 Return from ultrasound

## 2023-12-05 NOTE — Progress Notes (Signed)
 [facility] Progress Note  Patient: Manuel [name]    FMW:[id number]  DOA: 12/04/2023     Date of Service: the patient was seen and examined on 12/05/2023  Chief Complaint  Patient presents with   Weakness   Brief hospital course: Manuel [name] is a 78 y.o. 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 Patient fell with walker landing on top of him.  EMS was called for assistance.  Patient refused to be transported to ED for evaluation initial. However as time when on patient note more intense pain in right groin area and due to his EMS was called back and patient was transported to ED.      ED Course:  99.3/tmx 100.7 BP 131/72, hr 113, rr 19 sat 95%  UA :rare bacteria , ketones 20 ,  Wbc 11.6, hgb 14.3, plt 191, increase pmn Na 126 ( 137), K 5.3, cl91, glu 438, cr 1.12 Lactic 2.9 ,3.3 Trop 284,3403, 8119 EKG: sinus tachycardia , RAD twave change inferior lateral leads  Patient on evaluation found to have right femoral a rupture, vascular consulted and performed repair.     Patient course was further complicated by NTSEMI , s/p right femoral a repair patient was started on heparin  drip . Cardiology was consulted and will see patient in am , but plans for intervention  recommend medical management.   PCCM was involved for comanagement. [facility] was consulted for admission and further management as below.   Assessment and Plan:  # Right femoral artery Rupture s/p repair - plan of care as per vascular  -follow vascular recs    # NSTEMI -CAD s/p pic RCA c/b NSTEMI 06/2023 s/p stent x 3 to LAD Continue aspirin  and statin continue heparin  drip  - Follow  TTE -Troponin peaked 8119 >> 7607 - Cardiology following    # CAP with associated Sepsis  -  Opacities on chest imaging /fever/mild leukocytosis/lactic acidosis -start broad spectrum antibotics  -pulmonary toilet  -urine ag, sputum, f/u on culture data  -trend inflammatory markers     # DMII uncontrolled with hyperglycemia - AG13>>10 improved  -fs 394 >>93 improved Started Semglee  14 units nightly, NovoLog  sliding scale Monitor CBG, continue diabetic diet    # Isotonic hyponatremia, most likely nutritional deficiency Serum osmolality 280 WNL -gently ivfs  Monitor BMP    # CHF combined systolic diastolic  - no acute exacerbation , appear hypovolemic  - holding diuretic currently  -resume cardiac GDMT in am as able as bp and volume status allows    # PAD -s/p PTA -resume asa/plavix     # HTN  8/15 low blood pressure.  DC'd Coreg  Started midodrine  10 mg p.o. 3 times daily with holding parameters Monitor BP and titrate medication accordingly   # AKI due to hypotension versus contrast Continue IV fluid for hydration Started Mucomyst  600 mg p.o. twice daily for 2 days Monitor renal functions and urine output Check bladder scan Avoid nephrotoxic medications and use renally dose medications  # Metabolic acidosis due to AKI 8/15  bicarbonate 100 mEq x 1 dose given  # Hyperkalemia secondary to AKI Lokelma  10 g  x 2 doses given Monitor BMP daily   # OSA -02 at bedtime    # DJD of the spine -supportive care    # Hx of ETOH abuse -placed on ciwa     Body mass index is 27.37 kg/m.  Interventions:  Diet: CLD DVT Prophylaxis: Heparin  IV infusion   Advance goals of care discussion: Full code  Family Communication: family was present at bedside, at the time of interview.  The pt provided permission to discuss medical plan with the family. Opportunity was given to ask question and all questions were answered satisfactorily.   Disposition:  Pt is from Home,  admitted with sepsis, pneumonia, right femoral hematoma, non-STEMI, still very sick, which precludes a safe discharge. Discharge to home versus SNF, TBD, when stable, may need few days to improve.  Subjective: No significant events overnight, right groin pain is under control, patient denies any worsening of shortness of breath or chest pain.  Overall feeling improvement.  Physical Exam: General: NAD, lying comfortably Appear in no distress, affect appropriate Eyes: PERRLA ENT: Oral Mucosa Clear, moist  Neck: no JVD,  Cardiovascular: S1 and S2 Present, no Murmur,  Respiratory: good respiratory effort, Bilateral Air entry equal and Decreased, no Crackles, no wheezes Abdomen: Bowel Sound present, Soft and no tenderness,  Skin: no rashes Extremities: s/p right femoral repair, dressing soaked with dried blood, no active bleeding noticed.   S/p right BKA. LLE mild edema and pressure ulcers, no calf tenderness Neurologic: without any new focal findings Gait not checked due to patient safety concerns  Vitals:   12/05/23 0254 12/05/23 0400 12/05/23 0639 12/05/23 0700  BP:  91/69 96/73 (!) 81/51  Pulse: 79  71 69  Resp:  14 13 13   Temp:  98.4 F (36.9 C)    TempSrc:  Oral    SpO2:  96% 100% 99%  Weight:      Height:        Intake/Output Summary (Last 24 hours) at 12/05/2023 1541 Last data filed at 12/05/2023 0900 Gross per 24 hour  Intake 0 ml  Output 50 ml  Net -50 ml   Filed Weights   12/04/23 1138  Weight: 81.6 kg    Data Reviewed: I have personally reviewed and interpreted daily labs, tele strips, imagings as discussed above. I reviewed all nursing notes, pharmacy notes, vitals, pertinent old records I have discussed plan of care as described above with RN and patient/family.  CBC: Recent Labs  Lab 12/04/23 1141 12/04/23 2147 12/05/23 0519  WBC 11.6* 11.0* 14.5*  NEUTROABS 10.5*  --   --   HGB 14.3 13.3 11.5*  HCT 42.9 38.3* 34.2*  MCV 84.6 82.2 85.5  PLT 191 157  152   Basic Metabolic Panel: Recent Labs  Lab 12/04/23 2147 12/04/23 2244 12/05/23 0519 12/05/23 0832 12/05/23 1235  NA 124* 125* 126* 131* 130*  K 5.2* 5.3* 5.6* 5.4* 4.5  CL 93* 94* 97* 98 98  CO2 19* 18* 21* 20* 21*  GLUCOSE 395* 394* 296* 234* 143*  BUN 22 23 27* 26* 31*  CREATININE 0.98 1.12 1.37* 1.44* 1.45*  CALCIUM  8.6* 8.4* 8.1* 8.3* 8.0*    Studies: PERIPHERAL VASCULAR CATHETERIZATION Result Date: 12/04/2023 See surgical note for result.  CT CHEST ABDOMEN PELVIS W CONTRAST Result Date: 12/04/2023 CLINICAL DATA:  Sepsis.  Fall. EXAM: CT CHEST, ABDOMEN, AND PELVIS WITH CONTRAST TECHNIQUE: Multidetector CT imaging of the chest, abdomen and pelvis was performed following the standard protocol during bolus administration  of intravenous contrast. RADIATION DOSE REDUCTION: This exam was performed according to the departmental dose-optimization program which includes automated exposure control, adjustment of the mA and/or kV according to patient size and/or use of iterative reconstruction technique. CONTRAST:  OMNIPAQUE  IOHEXOL  300 MG/ML  SOLN COMPARISON:  CT abdomen and pelvis 06/16/2021. FINDINGS: CT CHEST FINDINGS Cardiovascular: The heart is mildly enlarged. There is no pericardial effusion. There are atherosclerotic calcifications of the aorta and coronary arteries. Aorta is normal in size. Mediastinum/Nodes: No enlarged mediastinal, hilar, or axillary lymph nodes. Thyroid gland, trachea, and esophagus demonstrate no significant findings. Lungs/Pleura: There are mild patchy peripheral ground-glass opacities in the anterior right upper lobe with some focal pleural thickening. There are calcified granulomas bilaterally. The lungs are otherwise clear. There is no pleural effusion or pneumothorax. Musculoskeletal: There is some expansile cystic changes in the right glenoid and non expansile cystic changes in the left lung is cleared these are incompletely imaged. No other focal  osseous lesions are identified. There is no acute fracture or dislocation identified. Multilevel degenerative changes affect the spine. There are healed right fourth through seventh rib fractures. CT ABDOMEN PELVIS FINDINGS Hepatobiliary: There is diffuse fatty infiltration of the liver. There is gallbladder wall edema. No radiopaque calculi are seen. There is no biliary ductal dilatation. Pancreas: Unremarkable. No pancreatic ductal dilatation or surrounding inflammatory changes. Spleen: Spleen is mildly enlarged. Adrenals/Urinary Tract: There is a 3 mm calculus in the distal right ureter. There is no right-sided hydronephrosis. Otherwise, the bilateral kidneys, adrenal glands and bladder are within normal limits. Stomach/Bowel: Stomach is within normal limits. Appendix appears normal. No evidence of bowel wall thickening, distention, or inflammatory changes. There is diffuse colonic diverticulosis, most significant in the sigmoid colon and descending colon. Vascular/Lymphatic: Infrarenal abdominal aortic aneurysm measures 3.2 cm similar to prior. There is severe atherosclerotic calcifications of the aorta. IVC is normal in size. There is a vascular stent in the left external iliac artery. There is hematoma surrounding the right common femoral artery measuring 6.6 x 4.9 by 7.0 cm. There is active extravasation of a large amount of contrast into the central hematoma with source at the anterior margin of the right common femoral artery. There is some scarring surrounding the left common femoral artery. No enlarged lymph nodes are identified. Reproductive: Prostate gland is mildly enlarged. Other: There is a small fat containing umbilical hernia. There is no ascites. There is no retroperitoneal hematoma. Musculoskeletal: There is severe degenerative changes throughout the spine. Chronic L1 compression fracture is unchanged. IMPRESSION: 1. Large hematoma surrounding the right common femoral artery with active arterial  extravasation of contrast into the hematoma. 2. 3.2 cm infrarenal abdominal aortic aneurysm. Recommend follow-up every 3 years. 3. Mild patchy peripheral ground-glass opacities in the anterior right upper lobe with some focal pleural thickening. Findings are nonspecific and may be infectious/inflammatory. 4. Gallbladder wall edema. No radiopaque calculi are seen. Correlate clinically for cholecystitis. 5. Nonobstructing 3 mm calculus in the distal right ureter. 6. Colonic diverticulosis. 7. Fatty infiltration of the liver. 8. Mild splenomegaly. Aortic Atherosclerosis (ICD10-I70.0). These results were called by telephone at the time of interpretation on 12/04/2023 at 5:49 pm to provider [facility] , who verbally acknowledged these results. Electronically Signed   By: Greig Pique M.D.   On: 12/04/2023 17:49   CT Head Wo Contrast Result Date: 12/04/2023 CLINICAL DATA:  Status post fall. EXAM: CT HEAD WITHOUT CONTRAST TECHNIQUE: Contiguous axial images were obtained from the base of the skull through  the vertex without intravenous contrast. RADIATION DOSE REDUCTION: This exam was performed according to the departmental dose-optimization program which includes automated exposure control, adjustment of the mA and/or kV according to patient size and/or use of iterative reconstruction technique. COMPARISON:  April 09, 2021 FINDINGS: Brain: There is generalized cerebral atrophy with widening of the extra-axial spaces and ventricular dilatation. There are areas of decreased attenuation within the white matter tracts of the supratentorial brain, consistent with microvascular disease changes. Vascular: Marked severity bilateral cavernous carotid artery calcification is noted. Skull: Normal. Negative for fracture or focal lesion. Sinuses/Orbits: No acute finding. Other: None. IMPRESSION: 1. Generalized cerebral atrophy and microvascular disease changes of the supratentorial brain. 2. No acute intracranial abnormality.  Electronically Signed   By: Suzen Dials M.D.   On: 12/04/2023 17:44   DG Shoulder Right Result Date: 12/04/2023 CLINICAL DATA:  Status post fall. EXAM: RIGHT SHOULDER - 2+ VIEW COMPARISON:  None Available. FINDINGS: There is no evidence of an acute fracture or dislocation. A chronic appearing deformity is seen along the inferior medial aspect of the right humeral head and neck. Multiple chronic right-sided rib fractures are noted. Marked severity degenerative changes are seen involving the right glenohumeral joint. Extensive chronic and degenerative changes are also present involving the right glenohumeral joint, in the form of joint space narrowing and extensive humeral and glenoid subchondral cyst formation. Soft tissues are unremarkable. IMPRESSION: Chronic and degenerative changes, as described above, without an acute osseous abnormality. Electronically Signed   By: Suzen Dials M.D.   On: 12/04/2023 17:41   CT Cervical Spine Wo Contrast Result Date: 12/04/2023 CLINICAL DATA:  Status post fall. EXAM: CT CERVICAL SPINE WITHOUT CONTRAST TECHNIQUE: Multidetector CT imaging of the cervical spine was performed without intravenous contrast. Multiplanar CT image reconstructions were also generated. RADIATION DOSE REDUCTION: This exam was performed according to the departmental dose-optimization program which includes automated exposure control, adjustment of the mA and/or kV according to patient size and/or use of iterative reconstruction technique. COMPARISON:  None Available. FINDINGS: Alignment: There is straightening of the normal cervical spine lordosis. Approximately 2 mm anterolisthesis of the C4 vertebral body is noted on C5. Skull base and vertebrae: No acute fracture. No primary bone lesion or focal pathologic process. Chronic and degenerative changes are seen involving the body and tip of the dens, as well as the adjacent portion of the anterior arch of C1. Soft tissues and spinal canal: No  prevertebral fluid or swelling. No visible canal hematoma. Disc levels: Marked severity endplate sclerosis, anterior osteophyte formation and posterior bony spurring are seen at the levels of C4-C5, C5-C6, C6-C7 and C7-T1. Posterior bony spurring is also present at the level of C3-C4. There is marked severity narrowing of the anterior atlantoaxial articulation. Marked severity intervertebral disc space narrowing is seen at C4-C5, C5-C6, C6-C7 and C7-T1. Bilateral marked severity multilevel facet joint hypertrophy is noted. Upper chest: Negative. Other: None. IMPRESSION: 1. No acute fracture or traumatic subluxation. 2. Marked severity multilevel degenerative changes, as described above. Electronically Signed   By: Suzen Dials M.D.   On: 12/04/2023 17:36    Scheduled Meds:  acetylcysteine   600 mg Oral BID   aspirin  EC  81 mg Oral Daily   atorvastatin   80 mg Oral QHS   Chlorhexidine  Gluconate Cloth  6 each Topical Daily   folic acid   1 mg Oral Daily   insulin  aspart  0-15 Units Subcutaneous Q4H   insulin  glargine-yfgn  14 Units Subcutaneous Q2200   multivitamin  with minerals  1 tablet Oral Daily   sodium chloride  flush  3 mL Intravenous Q12H   sodium zirconium cyclosilicate   10 g Oral TID   thiamine   100 mg Oral Daily   Or   thiamine   100 mg Intravenous Daily   Continuous Infusions:  sodium chloride      ceFEPime  (MAXIPIME ) IV 2 g (12/05/23 0157)   dextrose  5 % and 0.9 % NaCl 75 mL/hr at 12/05/23 0907   heparin  1,300 Units/hr (12/05/23 0908)   lactated ringers  75 mL/hr at 12/05/23 0907   PRN Meds: sodium chloride , acetaminophen , albuterol , dextrose , LORazepam  **OR** LORazepam , midodrine , morphine  injection, ondansetron  (ZOFRAN ) IV, oxyCODONE , sodium chloride  flush  Time spent: 55 minutes  Author: ELVAN SOR. MD [facility] 12/05/2023 3:41 PM  To reach On-call, see care teams to locate the attending and reach out to them via [email or web address]. If 7PM-7AM, please contact  night-coverage If you still have difficulty reaching the attending provider, please page the [facility] (Director on Call) for [facility] on amion for assistance.

## 2023-12-05 NOTE — Consult Note (Signed)
 Cardiology Consultation:   Patient ID: Manuel [name]; [id number]; June 18, 1945   Admit date: 12/04/2023 Date of Consult: 12/05/2023  Primary Care Provider: [name], [name], MD Primary Cardiologist: [name] Primary Electrophysiologist:  None   Patient Profile:   Manuel [name] is a 78 y.o. male with a hx of CAD status post PCI to the mid RCA in 2011 status post PCI/DES x 3 to the LAD in 06/2023, extensive PAD status post bilateral lower extremity percutaneous interventions and most recently right BKA in 03/2019 for, HFrEF secondary to ICM, DM 2, HTN, HLD, OSA, depression, alcohol  use, osteoarthritis, and gout who is being seen today for the evaluation of elevated troponin at the request of Dr. Debby.  History of Present Illness:   [name] was previously followed by [facility] cardiology, establishing care with our office during a hospital admission in 06/2023.  He was diagnosed with CAD in 2011 at which time he required 3 drug-eluting stents to the RCA.  He has historically been followed by [facility] cardiology and [name] advanced [facility] failure clinic in the setting of progressive worsening of LV dysfunction over the years.  In 10/2018, echo showed an EF of 40 to 45%.  This was followed by stress testing which showed inferior infarct with minimal peri-infarct ischemia.  He was medically managed.  He was admitted in 10/2022 in the setting of critical limb ischemia with admission complicated by dyspnea and heart failure.  Echo showed a drop in EF to 25 to 30%.  He was seen by [facility] cardiology at that time with recommendation for ongoing GDMT and diuresis with otherwise conservative management.  He was readmitted in 12/2018 for with volume overload and demand ischemia and was again conservatively managed.  He was readmitted in 03/2023 with altered mental status, sepsis, and osteomyelitis requiring right BKA.  Postop course was complicated by a right knee septic/gouty arthritis requiring I&D.  He was  admitted in 06/2023 with progressive shortness of breath that had been present for at least the preceding year with associated orthopnea, lower extremity swelling, and abdominal distention.  He was initially consulted on by [facility] cardiology, though requested second opinion and care was transferred to [facility] health cardiology.  Echo showed a persistent cardiomyopathy with an EF of 20 to 25%.  Initial and peak high-sensitivity troponin 1045.  Given symptoms, persistent and downtrending cardiomyopathy, and elevated troponin, he underwent R/LHC on 07/14/2023 that showed severe three-vessel CAD with successful PCI/DES with 3 overlapping drug-eluting stents to the LAD as outlined below.  It was felt the patient was not a candidate for CABG.  Remaining cath details include occluded proximal RCA with left-to-right collaterals, 95% LCx stenosis, 70% ramus intermedius stenosis, 30% distal left main stenosis, and 99% D1 stenosis.  He was admitted to [facility] on 12/04/2023 after sustaining a mechanical fall while trying to transfer from the bed to his wheelchair with his wheelchair subsequently landing on top of him.  He was on the ground for an extended timeframe, unable to get up.  He reports leading up to this episode he had been dealing with a GI illness earlier in the week with significant nausea, vomiting, and diarrhea with diminished oral intake.  In this setting, he had also skipped all of his medications, including antiplatelet therapy.  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  CT head without acute intracranial abnormality.  Throughout all of the above, the patient denied symptoms of chest  pain, dyspnea, or palpitations.  No LOC.  Troponin was checked and found to be elevated at 715 with a delta troponin of 6596 peaking at 8119, currently downtrending.  Admission also notable for PCT 9.58 with concern for CAP.  He underwent successful percutaneous transluminal angioplasty and stent placement to the right profunda femoris and common femoral artery extending into the distal right external iliac artery by vascular surgery.  Hemoglobin trend from 13.3-11.5, consistent with readings earlier this year.  At time of cardiology consult, patient is without symptoms of angina or cardiac decompensation.    Past Medical History:  Diagnosis Date   Acute ST elevation myocardial infarction (STEMI) of inferior wall (HCC) 04/19/2010   a.) transfered from [facility] to [facility] --> LHC/PCI (very difficult procedure) --> 3.0 x 23 mm and 3.0 x 12 mm Xience stents to RCA   Allergies    Arthritis    Benign essential hypertension    Bilateral carotid artery disease (HCC) 05/08/2021   a.) carotid doppler 05/08/2021: 1-39% BICA   CAD (coronary artery disease) 04/19/2010   a.) inferior STEMI 04/19/2010 --> LHC/PCI: 50-70% pD1, 80% pRI, 90/90/90% RCA (overlapping 3.0 x 23 and 3.0 x 12 mm Xience DES); b.) MV 11/10/2018: fixed minimally reversible inferior perfusion defect   Cellulitis of foot    Chronic HFrEF (heart failure with reduced ejection fraction) (HCC)    DDD (degenerative disc disease), cervical    Diabetes mellitus type 2, insulin  dependent (HCC)    Diverticulosis    Full dentures    Gout    Hard of hearing    History of bilateral cataract extraction 2022   History of ETOH abuse    Hyperlipidemia    Ischemic cardiomyopathy 04/19/2010   a.) TTE 04/19/2010: 40%; b.) TTE 04/20/2014: EF >55%; c.) TTE 11/10/2018: EF 45%; d.) TTE 11/14/2022: EF 25-30%; e. 06/2023 Echo: EF 25-30%, nl RV size/fxn. Mild MR. Mild-mod TR. Ao sclerosis w/o stenosis.   Long term current use of aspirin     Long term current use of  clopidogrel     Lumbar degenerative disc disease    Lumbar radiculopathy    Lumbar vertebral fracture (chronic superior endplate of L1)    NSTEMI (non-ST elevated myocardial infarction) (HCC) 01/15/2023   OSA (obstructive sleep apnea)    a.) unable to tolerate nocturnal PAP therapy   Peripheral artery disease (HCC)    a.) stenting 05/14/21: 12 mm x 12 cm LifeStent RIGHT dis SFA/prox pop; b.) s/p cath directed thrombolysis RIGHT SFA/pop 06/14/21; c.) s/p mech thrombectomy + stenting 06/15/21: 8 mm x 25cm & 8 mm x 7.5cm Viabahn; d.) s/p BILAT CFA, profunda femoris, SFA endarterectomies + fogarty embolectomy + stenting 11/15/22: 12mm x 58mm Lifestream BILAT CIAs, 14 mm x 6 cm Lifestream & 13 mm x 5 cm Viabahn LEFT EIA   Peripheral neuropathy    Umbilical hernia     Past Surgical History:  Procedure Laterality Date   AMPUTATION Right 11/18/2022   Procedure: AMPUTATION 4TH AND 5TH RAY;  Surgeon: [name], [name];  Location: [facility] ORS;  Service: Orthopedics/Podiatry;  Laterality: Right;  4th and 5th toe   AMPUTATION Right 04/20/2023   Procedure: AMPUTATION BELOW KNEE;  Surgeon: [name], MD;  Location: [facility]  ORS;  Service: Vascular;  Laterality: Right;  block then general   APPLICATION OF WOUND VAC Right 01/09/2023   Procedure: APPLICATION OF WOUND VAC;  Surgeon: [name], MD;  Location: [facility] ORS;  Service: Vascular;  Laterality: Right;   CATARACT EXTRACTION W/PHACO Right 03/14/2021   Procedure: CATARACT EXTRACTION PHACO AND INTRAOCULAR LENS PLACEMENT (IOC) RIGHT DIABETIC;  Surgeon: [name], MD;  Location: [facility] SURGERY [facility];  Service: Ophthalmology;  Laterality: Right;  Diabetic 16.78 01:39.9   CATARACT EXTRACTION W/PHACO Left 03/28/2021   Procedure: CATARACT EXTRACTION PHACO AND INTRAOCULAR LENS PLACEMENT (IOC) LEFT DIABETIC 6.93 01:22.0;  Surgeon: [name], MD;  Location: [facility] SURGERY [facility];  Service: Ophthalmology;  Laterality: Left;  Diabetic   COLONOSCOPY      CORONARY ANGIOPLASTY WITH STENT PLACEMENT  03/2010   Procedure: CORONARY ANGIOPLASTY WITH STENT PLACEMENT; Location: [name]   CORONARY STENT INTERVENTION N/A 07/14/2023   Procedure: CORONARY STENT INTERVENTION;  Surgeon: [name], MD;  Location: [facility] INVASIVE CV LAB;  Service: Cardiovascular;  Laterality: N/A;   ENDARTERECTOMY FEMORAL Bilateral 11/15/2022   Procedure: BILATERAL COMMON FEMORAL PROFUNDA FEMORIS AND SUPERFICIAL FEMORAL ARTERY ENDARTECTOMIES, RIGHT FOGARTY EMBOLECTOMY OF THE RIGHT SFA  AND  POPLITEAL ARTERIES. AORTAGRAM AND RIGHT LOWER EXTREMITY ANGIOGRAM.;  Surgeon: [name], MD;  Location: [facility] ORS;  Service: Vascular;  Laterality: Bilateral;   INSERTION OF ILIAC STENT Bilateral 11/15/2022   Procedure: BILATERAL STENT INSERTION IN BILATERAL  COMMON ILIAC ARTERY, STENT INSERTION OF LEFT EXTERNAL ILIAC ARTERY. ANGIOPLASTY RIGHT TIBIAL  AND POPLITEAL ARTERY.;  Surgeon: [name], MD;  Location: [facility] ORS;  Service: Vascular;  Laterality: Bilateral;   IRRIGATION AND DEBRIDEMENT KNEE Right 04/21/2023   Procedure: IRRIGATION AND DEBRIDEMENT KNEE;  Surgeon: Zafonte, Brian Daylyn, MD;  Location: [facility] ORS;  Service: Orthopedics;  Laterality: Right;   LOWER EXTREMITY ANGIOGRAPHY Right 05/14/2021   Procedure: LOWER EXTREMITY ANGIOGRAPHY;  Surgeon: [name], MD;  Location: [facility] INVASIVE CV LAB;  Service: Cardiovascular;  Laterality: Right;   LOWER EXTREMITY ANGIOGRAPHY Right 06/14/2021   Procedure: Lower Extremity Angiography;  Surgeon: [name], MD;  Location: [facility] INVASIVE CV LAB;  Service: Cardiovascular;  Laterality: Right;   LOWER EXTREMITY ANGIOGRAPHY Right 11/11/2022   Procedure: Lower Extremity Angiography;  Surgeon: [name], MD;  Location: [facility] INVASIVE CV LAB;  Service: Cardiovascular;  Laterality: Right;   LOWER EXTREMITY ANGIOGRAPHY Right 12/04/2023   Procedure: Lower Extremity Angiography;  Surgeon: [name], MD;  Location: [facility] INVASIVE CV LAB;   Service: Cardiovascular;  Laterality: Right;   LOWER EXTREMITY INTERVENTION Right 06/15/2021   Procedure: LOWER EXTREMITY INTERVENTION;  Surgeon: [name], MD;  Location: [facility] INVASIVE CV LAB;  Service: Cardiovascular;  Laterality: Right;   RIGHT/LEFT HEART CATH AND CORONARY ANGIOGRAPHY N/A 07/14/2023   Procedure: RIGHT/LEFT HEART CATH AND CORONARY ANGIOGRAPHY;  Surgeon: [name], MD;  Location: [facility] INVASIVE CV LAB;  Service: Cardiovascular;  Laterality: N/A;   TONSILLECTOMY     TRANSMETATARSAL AMPUTATION Right 02/17/2023   Procedure: TRANSMETATARSAL AMPUTATION;  Surgeon: Lennie Barter, [name];  Location: [facility] ORS;  Service: Orthopedics/Podiatry;  Laterality: Right;   WOUND DEBRIDEMENT Right 01/09/2023   Procedure: DEBRIDEMENT WOUND;  Surgeon: [name], MD;  Location: [facility] ORS;  Service: Vascular;  Laterality: Right;     Home Meds: Prior to Admission medications   Medication Sig Start Date End Date Taking? Authorizing Provider  aspirin  EC 81 MG tablet Take 1 tablet (81 mg total) by mouth daily. 11/26/22  Maree Hue, MD  atorvastatin  (LIPITOR ) 80 MG tablet Take 1 tablet (80 mg total) by mouth at bedtime. 11/18/23   Gollan, Timothy J, MD  carvedilol  (COREG ) 25 MG tablet Take 1 tablet (25 mg total) by mouth 2 (two) times daily with a meal. 11/18/23   Gollan, Evalene PARAS, MD  clopidogrel  (PLAVIX ) 75 MG tablet Take 1 tablet (75 mg total) by mouth daily. 11/18/23   Gollan, Timothy J, MD  dapagliflozin  propanediol (FARXIGA ) 10 MG TABS tablet Take 1 tablet (10 mg total) by mouth daily. 11/18/23   Gollan, Timothy J, MD  Ensure Max Protein (ENSURE MAX PROTEIN) LIQD Take 330 mLs (11 oz total) by mouth 2 (two) times daily. Patient taking differently: Take 11 oz by mouth daily. 11/26/22   Maree Hue, MD  escitalopram  (LEXAPRO ) 10 MG tablet Take 1 tablet (10 mg total) by mouth daily. Patient taking differently: Take 20 mg by mouth daily. 10/30/23   [name], [name], MD  ferrous sulfate 325 (65 FE) MG  EC tablet Take 325 mg by mouth daily with breakfast.    [provider]  folic acid  (FOLVITE ) 1 MG tablet Take 1 tablet (1 mg total) by mouth daily. 09/12/23   [name], [name], MD  furosemide  (LASIX ) 40 MG tablet Take 1 tablet (40 mg total) by mouth daily. 11/18/23   Gollan, Timothy J, MD  gabapentin  (NEURONTIN ) 100 MG capsule 1 po at bedtime. 10/30/23   [name], [name], MD  insulin  aspart protamine - aspart (NOVOLOG  MIX 70/30) (70-30) 100 UNIT/ML injection Inject 30 Units into the skin 2 (two) times daily with a meal.    [provider]  insulin  glargine (LANTUS  SOLOSTAR) 100 UNIT/ML Solostar Pen Inject 10 Units into the skin daily. Patient not taking: Reported on 11/18/2023 07/28/23   [name], [name], MD  methocarbamol  (ROBAXIN ) 500 MG tablet Take 500 mg by mouth every 8 (eight) hours as needed for muscle spasms.    [provider]  metolazone  (ZAROXOLYN ) 2.5 MG tablet Take 1 tablet (2.5 mg total) by mouth daily for 3 days. Take 30 minutes before your lasix . Patient not taking: Reported on 11/18/2023 07/21/23 11/18/23  Donette Ellouise LABOR, FNP  nitroGLYCERIN  (NITROSTAT ) 0.4 MG SL tablet Place 1 tablet (0.4 mg total) under the tongue every 5 (five) minutes as needed for chest pain. Max: 3 tablets per day 07/16/23 07/15/24  Dorinda Drue DASEN, MD  ondansetron  (ZOFRAN ) 4 MG tablet Take 1 tablet (4 mg total) by mouth every 6 (six) hours as needed for nausea. 05/08/23   Laurence Locus, DO  polyethylene glycol (MIRALAX  / GLYCOLAX ) 17 g packet Take 17 g by mouth daily as needed for mild constipation. 02/19/23   Caleen Qualia, MD  sacubitril -valsartan  (ENTRESTO ) 24-26 MG Take 1 tablet by mouth every 12 (twelve) hours. 11/18/23 11/17/24  Gollan, Timothy J, MD  senna-docusate (SENOKOT-S) 8.6-50 MG tablet Take 1 tablet by mouth at bedtime as needed for mild constipation. 06/17/21   Jillian Buttery, MD  spironolactone  (ALDACTONE ) 25 MG tablet Take 1 tablet (25 mg total) by mouth daily. 11/18/23   Gollan, Timothy  J, MD  thiamine  (VITAMIN B-1) 100 MG tablet Take 1 tablet (100 mg total) by mouth daily. 02/20/23   Amin, Sumayya, MD  traZODone  (DESYREL ) 150 MG tablet Take 75 mg by mouth at bedtime. Patient not taking: Reported on 11/18/2023    [provider]    Inpatient Medications: Scheduled Meds:  acetylcysteine   600 mg Oral BID   aspirin  EC  81 mg Oral Daily   atorvastatin   80 mg Oral QHS   Chlorhexidine  Gluconate Cloth  6 each Topical Daily   folic acid   1 mg Oral Daily   insulin  aspart  0-15 Units Subcutaneous Q4H   insulin  glargine-yfgn  14 Units Subcutaneous Q2200   multivitamin with minerals  1 tablet Oral Daily   sodium chloride  flush  3 mL Intravenous Q12H   sodium zirconium cyclosilicate   10 g Oral TID   thiamine   100 mg Oral Daily   Or   thiamine   100 mg Intravenous Daily   Continuous Infusions:  sodium chloride      ceFEPime  (MAXIPIME ) IV 2 g (12/05/23 0157)   dextrose  5 % and 0.9 % NaCl 75 mL/hr at 12/05/23 0907   heparin  1,300 Units/hr (12/05/23 0908)   lactated ringers  75 mL/hr at 12/05/23 0907   PRN Meds: sodium chloride , acetaminophen , albuterol , dextrose , LORazepam  **OR** LORazepam , midodrine , morphine  injection, ondansetron  (ZOFRAN ) IV, oxyCODONE , sodium chloride  flush  Allergies:  No Known Allergies  Social History:   Social History   Socioeconomic History   Marital status: Married    Spouse name: [name]   Number of children: 3   Years of education: Not on file   Highest education level: Not on file  Occupational History   Not on file  Tobacco Use   Smoking status: Never    Passive exposure: Never   Smokeless tobacco: Never  Vaping Use   Vaping status: Never Used  Substance and Sexual Activity   Alcohol  use: Yes    Alcohol /week: 21.0 standard drinks of alcohol     Types: 7 Glasses of wine, 14 Cans of beer per week    Comment: occassional   Drug use: No   Sexual activity: Not on file  Other Topics Concern   Not on file  Social History  Narrative   Not on file   Social Drivers of Health   Financial Resource Strain: Low Risk  (07/11/2023)   Overall Financial Resource Strain (CARDIA)    Difficulty of Paying Living Expenses: Not hard at all  Food Insecurity: No Food Insecurity (12/05/2023)   Hunger Vital Sign    Worried About Running Out of Food in the Last Year: Never true    Ran Out of Food in the Last Year: Never true  Transportation Needs: No Transportation Needs (12/05/2023)   PRAPARE - Administrator, Civil Service (Medical): No    Lack of Transportation (Non-Medical): No  Physical Activity: Not on file  Stress: Not on file  Social Connections: Moderately Isolated (12/05/2023)   Social Connection and Isolation Panel    Frequency of Communication with Friends and Family: More than three times a week    Frequency of Social Gatherings with Friends and Family: More than three times a week    Attends Religious Services: Patient declined    Database administrator or Organizations: No    Attends Banker Meetings: Patient declined    Marital Status: Married  Catering manager Violence: Not At Risk (12/05/2023)   Humiliation, Afraid, Rape, and Kick questionnaire    Fear of Current or Ex-Partner: No    Emotionally Abused: No    Physically Abused: No    Sexually Abused: No     Family History:   Family History  Problem Relation Age of Onset   Scoliosis Mother    Heart disease Father     ROS:  Review of Systems  Constitutional:  Positive for malaise/fatigue.  Negative for chills, diaphoresis, fever and weight loss.  HENT:  Negative for congestion.   Eyes:  Negative for discharge and redness.  Respiratory:  Negative for cough, sputum production, shortness of breath and wheezing.   Cardiovascular:  Negative for chest pain, palpitations, orthopnea, claudication, leg swelling and PND.  Gastrointestinal:  Positive for abdominal pain, diarrhea, nausea and vomiting. Negative for blood in stool,  constipation, heartburn and melena.  Musculoskeletal:  Positive for falls and joint pain. Negative for myalgias.  Skin:  Negative for rash.  Neurological:  Positive for weakness. Negative for dizziness, tingling, tremors, sensory change, speech change, focal weakness and loss of consciousness.  Endo/Heme/Allergies:  Does not bruise/bleed easily.  Psychiatric/Behavioral:  Negative for substance abuse. The patient is not nervous/anxious.   All other systems reviewed and are negative.     Physical Exam/Data:   Vitals:   12/05/23 0254 12/05/23 0400 12/05/23 0639 12/05/23 0700  BP:  91/69 96/73 (!) 81/51  Pulse: 79  71 69  Resp:  14 13 13   Temp:  98.4 F (36.9 C)    TempSrc:  Oral    SpO2:  96% 100% 99%  Weight:      Height:        Intake/Output Summary (Last 24 hours) at 12/05/2023 1233 Last data filed at 12/05/2023 0900 Gross per 24 hour  Intake 0 ml  Output 50 ml  Net -50 ml   Filed Weights   12/04/23 1138  Weight: 81.6 kg   Body mass index is 27.37 kg/m.   Physical Exam: General: Well developed, well nourished, in no acute distress. Head: Normocephalic, atraumatic, sclera non-icteric, no xanthomas, nares without discharge.  Neck: Negative for carotid bruits. JVD not elevated. Lungs: Diminished breath sounds bilaterally.  Breathing is unlabored. Heart: RRR with S1 S2. I/VI systolic murmur, no rubs, or gallops appreciated. Abdomen: Soft, non-tender, non-distended with normoactive bowel sounds. No hepatomegaly. No rebound/guarding. No obvious abdominal masses. Msk:  Strength and tone appear normal for age. Extremities: No clubbing or cyanosis. No edema.  Multiple bandages noted along the left lower extremity.  Status post right BKA. Neuro: Alert and oriented X 3. No facial asymmetry. No focal deficit. Moves all extremities spontaneously. Psych:  Responds to questions appropriately with a normal affect.   EKG:  The EKG was personally reviewed and demonstrates: Sinus  tachycardia, 113 bpm, right axis deviation, prior anterior and inferior infarct, nonspecific ST-T changes Telemetry:  Telemetry was personally reviewed and demonstrates: Sinus rhythm with sinus tachycardia with rates in the 80s to low 100s bpm  Weights: Filed Weights   12/04/23 1138  Weight: 81.6 kg    Relevant CV Studies:  [facility] 07/14/2023:   Ost RCA to Prox RCA lesion is 100% stenosed.   Ramus lesion is 70% stenosed.   1st Diag lesion is 99% stenosed.   Prox Cx lesion is 95% stenosed.   Dist LM lesion is 30% stenosed.   Mid LAD lesion is 90% stenosed.   Prox LAD to Mid LAD lesion is 80% stenosed.   Ost LAD to Prox LAD lesion is 50% stenosed.   A drug-eluting stent was successfully placed using a STENT ONYX FRONTIER 2.25X38.   A drug-eluting stent was successfully placed using a STENT ONYX FRONTIER 2.5X26.   A drug-eluting stent was successfully placed using a STENT ONYX FRONTIER 2.75X15.   Post intervention, there is a 0% residual stenosis.   Post intervention, there is a 0% residual stenosis.   Post intervention, there is a  0% residual stenosis.   1.  Severe three-vessel coronary artery disease. 2.  Left ventricular angiography was not performed.  EF was severely reduced by echo. 3.  Right heart catheterization showed moderately elevated right and left-sided filling pressures, moderate to severe pulmonary hypertension and moderately reduced cardiac output. 4.  Successful angioplasty and 3 overlapped drug-eluting stent placement to the LAD.  Extremely difficult procedure due to tortuosity and calcifications.  There was a proximal edge dissection that required adding a third stent all the way back to the ostium of the LAD.  The patient had significant chest pain and hypotension with the telescope catheter in place.  He was started on nitroglycerin  drip at the end.   Recommendations: I felt that the patient was not a candidate for CABG and thus I proceeded with LAD PCI.  Will transfer  the patient to stepdown ICU given chest pain postprocedure and will use nitroglycerin  drip.  Recommend medical therapy for the rest of his coronary artery disease. Continue IV diuresis for heart failure. __________  2D echo 07/10/2023: 1. Left ventricular ejection fraction, by estimation, is 25 to 30%. The  left ventricle has severely decreased function. The left ventricle  demonstrates regional wall motion abnormalities (see scoring  diagram/findings for description). The left  ventricular internal cavity size was moderately dilated. Left ventricular  diastolic parameters were normal. The global longitudinal strain is  normal.   2. Right ventricular systolic function is normal. The right ventricular  size is normal. There is normal pulmonary artery systolic pressure.   3. The mitral valve is normal in structure. Mild mitral valve  regurgitation. No evidence of mitral stenosis.   4. Tricuspid valve regurgitation is mild to moderate.   5. The aortic valve is normal in structure. Aortic valve regurgitation is  not visualized. Aortic valve sclerosis is present, with no evidence of  aortic valve stenosis.   6. The inferior vena cava is normal in size with greater than 50%  respiratory variability, suggesting right atrial pressure of 3 mmHg.  __________  2D echo 11/14/2022: 1. Left ventricular ejection fraction, by estimation, is 25 to 30%. The  left ventricle has severely decreased function. The left ventricle  demonstrates regional wall motion abnormalities (see scoring  diagram/findings for description). Left ventricular  diastolic parameters were normal.   2. Right ventricular systolic function is normal. The right ventricular  size is normal.   3. The mitral valve is normal in structure. Mild to moderate mitral valve  regurgitation. No evidence of mitral stenosis.   4. Tricuspid valve regurgitation is mild to moderate.   5. The aortic valve is normal in structure. Aortic valve  regurgitation is  not visualized. No aortic stenosis is present.   6. The inferior vena cava is normal in size with greater than 50%  respiratory variability, suggesting right atrial pressure of 3 mmHg.  __________  [facility] 11/10/2018: LVEF= 45%   FINDINGS:  Regional wall motion:  demonstrates  hypokinesis of the inferior wall.  The overall quality of the study is fair.   Artifacts noted: no  Left ventricular cavity: normal.   Perfusion Analysis:  SPECT images demonstrate moderate perfusion  abnormality of moderate intensity is present in the inferior region on the  stress images.  Resting images show moderate perfusion abnormality of  moderate intensity of the inferior myocardial region with slight  improvements consistent with previous infarct and/or scar as well as with  minimal amount of myocardial ischemia and reversibility defect type :  Mixed    __________  2D echo 11/10/2018: INTERPRETATION  MILD LV SYSTOLIC DYSFUNCTION WITH AN ESTIMATED EF = 40-45 %  NORMAL RIGHT VENTRICULAR SYSTOLIC FUNCTION  MILD-TO-MODERATE MITRAL VALVE INSUFFICIENCY  TRACE TRICUSPID VALVE INSUFFICIENCY  NO VALVULAR STENOSIS  INFERIOR WALL HYPOKINESIS  __________  2D echo 04/20/2014: INTERPRETATION  NORMAL LEFT VENTRICULAR SYSTOLIC FUNCTION  NORMAL RIGHT VENTRICULAR SYSTOLIC FUNCTION  MILD VALVULAR REGURGITATION (See above)  NO VALVULAR STENOSIS __________  LHC 04/19/2010: SUMMARY:  1. Heavy calcification throughout the coronary system.  2. Ramus intermedius with proximal 80% narrowing.  3. Large first diagonal branch with a 50% to 70% proximal narrowing.  4. Heavily calcified and complex tortuous mid right coronary artery lesion    with multiple sequential 90% lesions.  This was successfully dilated and    stented using overlapping 3 mm Xience stents of 23 mm and 12 mm    postdilated to 3.4 mm.  __________  Remaining remote cardiac imaging unavailable for review   Laboratory  Data:  Chemistry Recent Labs  Lab 12/04/23 2244 12/05/23 0519 12/05/23 0832  NA 125* 126* 131*  K 5.3* 5.6* 5.4*  CL 94* 97* 98  CO2 18* 21* 20*  GLUCOSE 394* 296* 234*  BUN 23 27* 26*  CREATININE 1.12 1.37* 1.44*  CALCIUM  8.4* 8.1* 8.3*  GFRNONAA >60 52* 49*  ANIONGAP 13 8 13     Recent Labs  Lab 12/04/23 1141 12/04/23 2147 12/05/23 0519  PROT 6.9 6.5 5.9*  ALBUMIN 3.5 3.1* 2.7*  AST 27 44* 42*  ALT 15 17 17   ALKPHOS 90 77 65  BILITOT 1.7* 2.0* 1.7*   Hematology Recent Labs  Lab 12/04/23 1141 12/04/23 2147 12/05/23 0519  WBC 11.6* 11.0* 14.5*  RBC 5.07 4.66 4.00*  HGB 14.3 13.3 11.5*  HCT 42.9 38.3* 34.2*  MCV 84.6 82.2 85.5  MCH 28.2 28.5 28.8  MCHC 33.3 34.7 33.6  RDW 15.3 15.3 15.5  PLT 191 157 152   Cardiac EnzymesNo results for input(s): TROPONINI in the last 168 hours. No results for input(s): TROPIPOC in the last 168 hours.  BNP Recent Labs  Lab 12/04/23 2147  BNP 3,374.0*    DDimer No results for input(s): DDIMER in the last 168 hours.  Radiology/Studies:   CT CHEST ABDOMEN PELVIS W CONTRAST Result Date: 12/04/2023 IMPRESSION: 1. Large hematoma surrounding the right common femoral artery with active arterial extravasation of contrast into the hematoma. 2. 3.2 cm infrarenal abdominal aortic aneurysm. Recommend follow-up every 3 years. 3. Mild patchy peripheral ground-glass opacities in the anterior right upper lobe with some focal pleural thickening. Findings are nonspecific and may be infectious/inflammatory. 4. Gallbladder wall edema. No radiopaque calculi are seen. Correlate clinically for cholecystitis. 5. Nonobstructing 3 mm calculus in the distal right ureter. 6. Colonic diverticulosis. 7. Fatty infiltration of the liver. 8. Mild splenomegaly. Aortic Atherosclerosis (ICD10-I70.0). These results were called by telephone at the time of interpretation on 12/04/2023 at 5:49 pm to provider [facility] , who verbally acknowledged these  results. Electronically Signed   By: Greig Pique M.D.   On: 12/04/2023 17:49   CT Head Wo Contrast Result Date: 12/04/2023 IMPRESSION: 1. Generalized cerebral atrophy and microvascular disease changes of the supratentorial brain. 2. No acute intracranial abnormality. Electronically Signed   By: Suzen Dials M.D.   On: 12/04/2023 17:44   DG Shoulder Right Result Date: 12/04/2023 IMPRESSION: Chronic and degenerative changes, as described above, without an acute osseous abnormality. Electronically Signed   By: Suzen  Houston M.D.   On: 12/04/2023 17:41   CT Cervical Spine Wo Contrast Result Date: 12/04/2023 IMPRESSION: 1. No acute fracture or traumatic subluxation. 2. Marked severity multilevel degenerative changes, as described above. Electronically Signed   By: Suzen Dials M.D.   On: 12/04/2023 17:36   DG Chest 2 View Result Date: 12/04/2023 IMPRESSION: 1. No acute cardiopulmonary findings. 2. Stable cardiomegaly. 3.  Aortic Atherosclerosis (ICD10-I70.0). Electronically Signed   By: Harrietta Sherry M.D.   On: 12/04/2023 12:34    Assessment and Plan:   1.  CAD involving the native coronary arteries with elevated high-sensitivity troponin: - Never with symptoms of angina or cardiac decompensation - Unclear at this time if his troponin elevation represents supply/demand ischemia in the setting of recent fall in the context of GI illness with poor oral intake with underlying CAD versus NSTEMI -He has missed antiplatelet therapy since 11/30/2023 in the setting of GI illness - Currently on heparin  drip -PTA aspirin  and atorvastatin  - Obtain echo - Will discuss with interventional cardiology regarding ischemic testing  2.  HFrEF secondary to ICM: - Currently appears euvolemic - GDMT has been held in the setting of relative hypotension, resume as able - No active indication for IV diuresis at this time - Repeat echo  3.  PAD status post right BKA complicated by mechanical fall  leading to right common femoral artery hematoma with extravasation: - Status post repair by vascular surgery - Hemoglobin stable  3.  AAA: - Noted on CT imaging this admission measuring 3.2 cm with radiology recommended to follow-up in 3 years - Optimal blood pressure control  4.  HTN: - Blood pressure soft, asymptomatic  5.  HLD: - PTA atorvastatin         For questions or updates, please contact [facility] Please consult [email or web address] for contact info under Cardiology/STEMI.   Signed, Bernardino Bring, PA-C [facility] Pager: [phone] 12/05/2023, 12:33 PM

## 2023-12-05 NOTE — Progress Notes (Signed)
 PHARMACY - PHYSICIAN COMMUNICATION CRITICAL VALUE ALERT - BLOOD CULTURE IDENTIFICATION (BCID)  Manuel [name] is an 78 y.o. male who presented to [facility] on 12/04/2023 with a chief complaint of rupture of femoral artery,  sepsis/HCAP.   Assessment:  Serratia Marcescens (include suspected source if known)  Name of physician (or Provider) Contacted: Elvan Sor, MD   Current antibiotics: Azithromycin , Cefepime  , Vancomycin    Changes to prescribed antibiotics recommended:  Recommendations accepted by provider  - will d/c Vanc and azithromycin  and continue with cefepime   Results for orders placed or performed during the hospital encounter of 12/04/23  Blood Culture ID Panel (Reflexed) (Collected: 12/04/2023  3:48 PM)  Result Value Ref Range   Enterococcus faecalis NOT DETECTED NOT DETECTED   Enterococcus Faecium NOT DETECTED NOT DETECTED   Listeria monocytogenes NOT DETECTED NOT DETECTED   Staphylococcus species NOT DETECTED NOT DETECTED   Staphylococcus aureus (BCID) NOT DETECTED NOT DETECTED   Staphylococcus epidermidis NOT DETECTED NOT DETECTED   Staphylococcus lugdunensis NOT DETECTED NOT DETECTED   Streptococcus species NOT DETECTED NOT DETECTED   Streptococcus agalactiae NOT DETECTED NOT DETECTED   Streptococcus pneumoniae NOT DETECTED NOT DETECTED   Streptococcus pyogenes NOT DETECTED NOT DETECTED   A.calcoaceticus-baumannii NOT DETECTED NOT DETECTED   Bacteroides fragilis NOT DETECTED NOT DETECTED   Enterobacterales DETECTED (A) NOT DETECTED   Enterobacter cloacae complex NOT DETECTED NOT DETECTED   Escherichia coli NOT DETECTED NOT DETECTED   Klebsiella aerogenes NOT DETECTED NOT DETECTED   Klebsiella oxytoca NOT DETECTED NOT DETECTED   Klebsiella pneumoniae NOT DETECTED NOT DETECTED   Proteus species NOT DETECTED NOT DETECTED   Salmonella species NOT DETECTED NOT DETECTED   Serratia marcescens DETECTED (A) NOT DETECTED   Haemophilus influenzae NOT DETECTED NOT  DETECTED   Neisseria meningitidis NOT DETECTED NOT DETECTED   Pseudomonas aeruginosa NOT DETECTED NOT DETECTED   Stenotrophomonas maltophilia NOT DETECTED NOT DETECTED   Candida albicans NOT DETECTED NOT DETECTED   Candida auris NOT DETECTED NOT DETECTED   Candida glabrata NOT DETECTED NOT DETECTED   Candida krusei NOT DETECTED NOT DETECTED   Candida parapsilosis NOT DETECTED NOT DETECTED   Candida tropicalis NOT DETECTED NOT DETECTED   Cryptococcus neoformans/gattii NOT DETECTED NOT DETECTED   CTX-M ESBL NOT DETECTED NOT DETECTED   Carbapenem resistance IMP NOT DETECTED NOT DETECTED   Carbapenem resistance KPC NOT DETECTED NOT DETECTED   Carbapenem resistance NDM NOT DETECTED NOT DETECTED   Carbapenem resist OXA 48 LIKE NOT DETECTED NOT DETECTED   Carbapenem resistance VIM NOT DETECTED NOT DETECTED    Keri Veale D 12/05/2023  7:14 AM

## 2023-12-05 NOTE — TOC Initial Note (Signed)
 Transition of Care [facility]) - Initial/Assessment Note    Patient Details  Name: Manuel [name] MRN: [id number] Date of Birth: April 16, 1946  [facility] CM/SW Contact:    Corrie JINNY Ruts, LCSW Phone Number: 12/05/2023, 3:12 PM  Clinical Narrative:                 Chart reviewed. The patient admitted for rupture of femoral artery. I spoke with the patient and the patient wife today at bedside today. I introduced myself, my role, and reason for consult. The patient confirmed that he has a PCP. The patient confirms that he lives in the home with his wife. The patient reports that he could some of his daily living task independently but needed some assistance from his wife. The patient patient confirms that his wife transports him to medical appointments and she will be helping during discharge.   The patient confirms that he uses [facility] as his pharmacy and his copays are affordable. The patient confirms that he has had HH with [facility] and admitted to [facility] in the past. The patient reports having [facility] come in the home health in march. The patient reports having a wheel chair, cane, crutches, BSC, and prosthesis in the home.   SUD resources are added to the patient AVS.     Barriers to Discharge: Continued Medical Work up   Patient Goals and CMS Choice            Expected Discharge Plan and Services       Living arrangements for the past 2 months: Single Family Home                                      Prior Living Arrangements/Services Living arrangements for the past 2 months: Single Family Home Lives with:: Spouse Patient language and need for interpreter reviewed:: Yes        Need for Family Participation in Patient Care: Yes (Comment)   Current home services: DME Criminal Activity/Legal Involvement Pertinent to Current Situation/Hospitalization: No - Comment as needed  Activities of Daily Living   ADL Screening (condition at time of  admission) Independently performs ADLs?: Yes (appropriate for developmental age) Is the patient deaf or have difficulty hearing?: No Does the patient have difficulty seeing, even when wearing glasses/contacts?: No Does the patient have difficulty concentrating, remembering, or making decisions?: No  Permission Sought/Granted                  Emotional Assessment Appearance:: Appears stated age Attitude/Demeanor/Rapport: Engaged, Gracious Affect (typically observed): Calm, Appropriate, Pleasant Orientation: : Oriented to Self, Oriented to Place, Oriented to  Time, Oriented to Situation Alcohol  / Substance Use: Alcohol  Use Psych Involvement: No (comment)  Admission diagnosis:  Rupture of femoral artery (HCC) [I77.2] Patient Active Problem List   Diagnosis Date Noted   Rupture of femoral artery (HCC) 12/04/2023   Acute systolic heart failure (HCC) 07/12/2023   NSTEMI (non-ST elevated myocardial infarction) (HCC) 07/09/2023   Insomnia 07/09/2023   DNR (do not resuscitate)/DNI(Do Not Intubate( 05/07/2023   S/P BKA (below knee amputation), right (HCC) - 04-20-2023 05/07/2023   Current severe episode of major depressive disorder without psychotic features (HCC) 04/28/2023   Acute kidney injury superimposed on chronic kidney disease (HCC) 04/19/2023   Chronic hyponatremia - baseline Na 128-130. 04/19/2023   Essential hypertension 04/19/2023   Dyslipidemia 04/19/2023   BPH (benign  prostatic hyperplasia) 04/19/2023   Acute metabolic encephalopathy 04/19/2023   Adjustment disorder with depressed mood 11/14/2022   Chronic combined systolic and diastolic CHF (congestive heart failure) (HCC) 11/12/2022   ETOH abuse 06/07/2021   Coronary artery disease with history of myocardial infarction without history of CABG 05/08/2021   S/P angioplasty with stent 05/08/2021   PAD (peripheral artery disease) (HCC) 05/08/2021   Diabetic polyneuropathy associated with type 2 diabetes mellitus (HCC)  09/25/2017   Lumbar degenerative disc disease 01/27/2017   Insulin  dependent type 2 diabetes mellitus (HCC) 10/18/2014   Bilateral carotid artery stenosis 04/05/2014   Gout 09/21/2013   Mixed hyperlipidemia 09/14/2013   PCP:  [name], [name], MD Pharmacy:   [id number] [place], [place] - [address] AT [facility] [address] [place] [address] Phone: [phone] Fax: [phone]     Social Drivers of Health (SDOH) Social History: SDOH Screenings   Food Insecurity: No Food Insecurity (12/05/2023)  Housing: Low Risk  (12/05/2023)  Transportation Needs: No Transportation Needs (12/05/2023)  Utilities: Not At Risk (12/05/2023)  Depression (PHQ2-9): High Risk (09/11/2023)  Financial Resource Strain: Low Risk  (07/11/2023)  Social Connections: Moderately Isolated (12/05/2023)  Tobacco Use: Low Risk  (12/04/2023)   SDOH Interventions:     Readmission Risk Interventions    12/05/2023    3:09 PM 07/09/2023    8:30 PM 02/18/2023   11:58 AM  Readmission Risk Prevention Plan  Transportation Screening Complete Complete   PCP or Specialist Appt within 3-5 Days   Complete  HRI or Home Care Consult   Complete  Social Work Consult for Recovery Care Planning/Counseling   Complete  Palliative Care Screening   Not Applicable  Medication Review Oceanographer) Complete Complete Complete  PCP or Specialist appointment within 3-5 days of discharge Complete    HRI or Home Care Consult  Complete   SW Recovery Care/Counseling Consult Complete Complete   Palliative Care Screening Not Applicable Not Applicable   Skilled Nursing Facility Not Applicable Not Applicable

## 2023-12-05 NOTE — Progress Notes (Signed)
 Echocardiogram 2D Echocardiogram has been performed.  Manuel [name] 12/05/2023, 5:28 PM

## 2023-12-05 NOTE — Progress Notes (Signed)
 Pharmacy Antibiotic Note  Manuel [name] is a 78 y.o. male admitted on 12/04/2023 with pneumonia and sepsis.  Pharmacy has been consulted for Vanc, Cefepime  dosing.  Plan: Cefepime  2 gm IV Q12H ordered to start on 8/15 @ 0100.  Vancomycin  2 gm IV X 1 ordered for 8/15 @ ~ 0200. Vancomycin  1250 mg IV Q24H ordered to start on 8/15 @ 0200.  AUC = 456.2 Vanc trough = 11.1   Height: 5' 8 (172.7 cm) Weight: 81.6 kg (180 lb) IBW/kg (Calculated) : 68.4  Temp (24hrs), Avg:99.2 F (37.3 C), Min:98.7 F (37.1 C), Max:100.7 F (38.2 C)  Recent Labs  Lab 12/04/23 1141 12/04/23 1548 12/04/23 2147 12/04/23 2244 12/05/23 0023  WBC 11.6*  --  11.0*  --   --   CREATININE 1.12  --  0.98 1.12  --   LATICACIDVEN 2.9* 3.3*  --  2.7* 2.9*    Estimated Creatinine Clearance: 50.9 mL/min (by C-G formula based on SCr of 1.12 mg/dL).    No Known Allergies  Antimicrobials this admission:   >>    >>   Dose adjustments this admission:   Microbiology results:  BCx:   UCx:    Sputum:    MRSA PCR:   Thank you for allowing pharmacy to be a part of this patient's care.  Brando Taves D 12/05/2023 1:03 AM

## 2023-12-05 NOTE — Consult Note (Signed)
 PHARMACY - ANTICOAGULATION CONSULT NOTE  Pharmacy Consult for IV Heparin  Indication: chest pain/ACS  Patient Measurements: Height: 5' 8 (172.7 cm) Weight: 81.6 kg (180 lb) IBW/kg (Calculated) : 68.4 HEPARIN  DW (KG): 81.6  Labs: Recent Labs    12/04/23 1141 12/04/23 1822 12/04/23 2147 12/04/23 2244 12/05/23 0023 12/05/23 0519 12/05/23 0832 12/05/23 1235 12/05/23 1643  HGB 14.3  --  13.3  --   --  11.5*  --   --   --   HCT 42.9  --  38.3*  --   --  34.2*  --   --   --   PLT 191  --  157  --   --  152  --   --   --   APTT  --   --  55*  --   --   --   --   --   --   LABPROT  --   --  17.0*  --   --   --   --   --   --   INR  --   --  1.3*  --   --   --   --   --   --   HEPARINUNFRC  --   --   --   --   --  <0.10*  --   --  <0.10*  CREATININE 1.12  --  0.98 1.12  --  1.37* 1.44* 1.45* 1.35*  CKTOTAL 115  --   --   --   --   --   --   --   --   TROPONINIHS 715* 6,596*  --  8,119* 7,607*  --   --   --   --     Estimated Creatinine Clearance: 42.2 mL/min (A) (by C-G formula based on SCr of 1.35 mg/dL (H)).   Medical History: Past Medical History:  Diagnosis Date   Acute ST elevation myocardial infarction (STEMI) of inferior wall (HCC) 04/19/2010   a.) transfered from [facility] to [facility] --> LHC/PCI (very difficult procedure) --> 3.0 x 23 mm and 3.0 x 12 mm Xience stents to RCA   Allergies    Arthritis    Benign essential hypertension    Bilateral carotid artery disease (HCC) 05/08/2021   a.) carotid doppler 05/08/2021: 1-39% BICA   CAD (coronary artery disease) 04/19/2010   a.) inferior STEMI 04/19/2010 --> LHC/PCI: 50-70% pD1, 80% pRI, 90/90/90% RCA (overlapping 3.0 x 23 and 3.0 x 12 mm Xience DES); b.) MV 11/10/2018: fixed minimally reversible inferior perfusion defect   Cellulitis of foot    Chronic HFrEF (heart failure with reduced ejection fraction) (HCC)    DDD (degenerative disc disease), cervical    Diabetes mellitus type 2, insulin  dependent (HCC)    Diverticulosis     Full dentures    Gout    Hard of hearing    History of bilateral cataract extraction 2022   History of ETOH abuse    Hyperlipidemia    Ischemic cardiomyopathy 04/19/2010   a.) TTE 04/19/2010: 40%; b.) TTE 04/20/2014: EF >55%; c.) TTE 11/10/2018: EF 45%; d.) TTE 11/14/2022: EF 25-30%; e. 06/2023 Echo: EF 25-30%, nl RV size/fxn. Mild MR. Mild-mod TR. Ao sclerosis w/o stenosis.   Long term current use of aspirin     Long term current use of clopidogrel     Lumbar degenerative disc disease    Lumbar radiculopathy    Lumbar vertebral fracture (chronic superior endplate of L1)    NSTEMI (non-ST  elevated myocardial infarction) (HCC) 01/15/2023   OSA (obstructive sleep apnea)    a.) unable to tolerate nocturnal PAP therapy   Peripheral artery disease (HCC)    a.) stenting 05/14/21: 12 mm x 12 cm LifeStent RIGHT dis SFA/prox pop; b.) s/p cath directed thrombolysis RIGHT SFA/pop 06/14/21; c.) s/p mech thrombectomy + stenting 06/15/21: 8 mm x 25cm & 8 mm x 7.5cm Viabahn; d.) s/p BILAT CFA, profunda femoris, SFA endarterectomies + fogarty embolectomy + stenting 11/15/22: 12mm x 58mm Lifestream BILAT CIAs, 14 mm x 6 cm Lifestream & 13 mm x 5 cm Viabahn LEFT EIA   Peripheral neuropathy    Umbilical hernia     Medications:  No anticoagulation prior to admission per my chart review. Takes ASA / Plavix  at home  Assessment: 78 y/o M with medical history as above and including CAD, PAD presenting to the ED 8/14 with right common femoral artery hematoma with active extravasation. Patient underwent endovascular intervention same day. However, clinical picture complicated by rising troponin (715 >> 6596). Pharmacy consulted to dose heparin .  Baseline aPTT and INR are pending. Baseline H&H, platelets are within normal limits.  Goal of Therapy:  Heparin  level 0.3-0.7 units/ml Monitor platelets by anticoagulation protocol: Yes   Plan:  --8/15 at 1643 HL < 0.1 --Heparin  2500 unit IV bolus and increase heparin   infusion rate to 1600 units/hr --Re-check HL 8 hours from rate change --Daily CBC per protocol while on IV heparin   Marolyn KATHEE Mare 12/05/2023,6:00 PM

## 2023-12-05 NOTE — Progress Notes (Signed)
 Attempted Echocardiogram, patient went to Vascular.

## 2023-12-05 NOTE — Consult Note (Signed)
 PHARMACY - ANTICOAGULATION CONSULT NOTE  Pharmacy Consult for IV Heparin  Indication: chest pain/ACS  No Known Allergies  Patient Measurements: Height: 5' 8 (172.7 cm) Weight: 81.6 kg (180 lb) IBW/kg (Calculated) : 68.4 HEPARIN  DW (KG): 81.6  Vital Signs: Temp: 98.4 F (36.9 C) (08/15 0400) Temp Source: Oral (08/15 0400) BP: 81/51 (08/15 0700) Pulse Rate: 69 (08/15 0700)  Labs: Recent Labs    12/04/23 1141 12/04/23 1822 12/04/23 2147 12/04/23 2244 12/05/23 0023 12/05/23 0519  HGB 14.3  --  13.3  --   --  11.5*  HCT 42.9  --  38.3*  --   --  34.2*  PLT 191  --  157  --   --  152  APTT  --   --  55*  --   --   --   LABPROT  --   --  17.0*  --   --   --   INR  --   --  1.3*  --   --   --   HEPARINUNFRC  --   --   --   --   --  <0.10*  CREATININE 1.12  --  0.98 1.12  --  1.37*  CKTOTAL 115  --   --   --   --   --   TROPONINIHS 715* 6,596*  --  8,119* 7,607*  --     Estimated Creatinine Clearance: 41.6 mL/min (A) (by C-G formula based on SCr of 1.37 mg/dL (H)).   Medical History: Past Medical History:  Diagnosis Date   Acute ST elevation myocardial infarction (STEMI) of inferior wall (HCC) 04/19/2010   a.) [facility] from [facility] to [facility] --> LHC/PCI (very difficult procedure) --> 3.0 x 23 mm and 3.0 x 12 mm Xience stents to RCA   Allergies    Arthritis    Benign essential hypertension    Bilateral carotid artery disease (HCC) 05/08/2021   a.) carotid doppler 05/08/2021: 1-39% BICA   CAD (coronary artery disease) 04/19/2010   a.) inferior STEMI 04/19/2010 --> LHC/PCI: 50-70% pD1, 80% pRI, 90/90/90% RCA (overlapping 3.0 x 23 and 3.0 x 12 mm Xience DES); b.) MV 11/10/2018: fixed minimally reversible inferior perfusion defect   Cellulitis of foot    Chronic HFrEF (heart failure with reduced ejection fraction) (HCC)    DDD (degenerative disc disease), cervical    Diabetes mellitus type 2, insulin  dependent (HCC)    Diverticulosis    Full dentures    Gout    Hard of  hearing    History of bilateral cataract extraction 2022   History of ETOH abuse    Hyperlipidemia    Ischemic cardiomyopathy 04/19/2010   a.) TTE 04/19/2010: 40%; b.) TTE 04/20/2014: EF >55%; c.) TTE 11/10/2018: EF 45%; d.) TTE 11/14/2022: EF 25-30%; e. 06/2023 Echo: EF 25-30%, nl RV size/fxn. Mild MR. Mild-mod TR. Ao sclerosis w/o stenosis.   Long term current use of aspirin     Long term current use of clopidogrel     Lumbar degenerative disc disease    Lumbar radiculopathy    Lumbar vertebral fracture (chronic superior endplate of L1)    NSTEMI (non-ST elevated myocardial infarction) (HCC) 01/15/2023   OSA (obstructive sleep apnea)    a.) unable to tolerate nocturnal PAP therapy   Peripheral artery disease (HCC)    a.) stenting 05/14/21: 12 mm x 12 cm LifeStent RIGHT dis SFA/prox pop; b.) s/p cath directed thrombolysis RIGHT SFA/pop 06/14/21; c.) s/p mech thrombectomy + stenting 06/15/21: 8 mm  x 25cm & 8 mm x 7.5cm Viabahn; d.) s/p BILAT CFA, profunda femoris, SFA endarterectomies + fogarty embolectomy + stenting 11/15/22: 12mm x 58mm Lifestream BILAT CIAs, 14 mm x 6 cm Lifestream & 13 mm x 5 cm Viabahn LEFT EIA   Peripheral neuropathy    Umbilical hernia     Medications:  No anticoagulation prior to admission per my chart review. Takes ASA / Plavix  at home  Assessment: 78 y/o M with medical history as above and including CAD, PAD presenting to the ED 8/14 with right common femoral artery hematoma with active extravasation. Patient underwent endovascular intervention same day. However, clinical picture complicated by rising troponin (715 >> 6596). Pharmacy consulted to dose heparin .  Baseline aPTT and INR are pending. Baseline H&H, platelets are within normal limits.  Goal of Therapy:  Heparin  level 0.3-0.7 units/ml Monitor platelets by anticoagulation protocol: Yes   Plan:  8/15@0519 : HL<0.10 --Bolus 2500 units x 1 --Increase heparin  infusion rate to 1300 units/hr --Check HL 8 hours  after rate change --Daily CBC per protocol while on IV heparin ; monitor closely for bleeding  Infant Zink A Milla Wahlberg 12/05/2023,8:29 AM

## 2023-12-05 NOTE — Progress Notes (Signed)
 To ultrasound

## 2023-12-05 NOTE — Care Management Important Message (Signed)
 Important Message  Patient Details  Name: Manuel [name] MRN: [id number] Date of Birth: June 14, 1945   Important Message Given:  Yes - Medicare IM     [name] 12/05/2023, 10:06 AM

## 2023-12-05 NOTE — Inpatient Diabetes Management (Signed)
 Inpatient Diabetes Program Recommendations  AACE/ADA: New Consensus Statement on Inpatient Glycemic Control (2015)  Target Ranges:  Prepandial:   less than 140 mg/dL      Peak postprandial:   less than 180 mg/dL (1-2 hours)      Critically ill patients:  140 - 180 mg/dL    Latest Reference Range & Units 12/04/23 11:41  Glucose 70 - 99 mg/dL 561 (H)  (H): Data is abnormally high  Latest Reference Range & Units 12/04/23 18:24 12/04/23 22:21 12/05/23 01:59 12/05/23 03:37  Glucose-Capillary 70 - 99 mg/dL 588 (H) 568 (H)  14 units Novolog  @2324   14 units Semglee  @0004  356 (H) 310 (H)  6 units Novolog  @0511   (H): Data is abnormally high  Admit with:  Fall after attempting to transfer from bed to wheel chair Large right femoral hematoma with active hemorrhage status post stent placement, possible sepsis and suspected NSTEMI CAP with associated Sepsis   History: DM, CHF, ETOH  Home DM Meds: Farxiga  10 mg daily       Novolog  70/30 Insulin  30 units BID  Current Orders: Semglee  14 units at bedtime    MD- No orders for Novolog  SSI this AM  Please consider adding Novolog  Moderate Correction Scale/ SSI (0-15 units) Q4 hours    --Will follow patient during hospitalization--  Adina Rudolpho Arrow RN, MSN, CDCES Diabetes Coordinator Inpatient Glycemic Control Team Team Pager: [phone] (8a-5p)

## 2023-12-06 DIAGNOSIS — I255 Ischemic cardiomyopathy: Secondary | ICD-10-CM

## 2023-12-06 DIAGNOSIS — I2510 Atherosclerotic heart disease of native coronary artery without angina pectoris: Secondary | ICD-10-CM

## 2023-12-06 DIAGNOSIS — I772 Rupture of artery: Secondary | ICD-10-CM | POA: Diagnosis not present

## 2023-12-06 DIAGNOSIS — I10 Essential (primary) hypertension: Secondary | ICD-10-CM | POA: Diagnosis not present

## 2023-12-06 LAB — BASIC METABOLIC PANEL WITH GFR
Anion gap: 9 (ref 5–15)
BUN: 30 mg/dL — ABNORMAL HIGH (ref 8–23)
CO2: 21 mmol/L — ABNORMAL LOW (ref 22–32)
Calcium: 7.9 mg/dL — ABNORMAL LOW (ref 8.9–10.3)
Chloride: 99 mmol/L (ref 98–111)
Creatinine, Ser: 1.25 mg/dL — ABNORMAL HIGH (ref 0.61–1.24)
GFR, Estimated: 58 mL/min — ABNORMAL LOW (ref >60)
Glucose, Bld: 149 mg/dL — ABNORMAL HIGH (ref 70–99)
Potassium: 4 mmol/L (ref 3.5–5.1)
Sodium: 129 mmol/L — ABNORMAL LOW (ref 135–145)

## 2023-12-06 LAB — CBC
HCT: 34.1 % — ABNORMAL LOW (ref 39.0–52.0)
Hemoglobin: 11.7 g/dL — ABNORMAL LOW (ref 13.0–17.0)
MCH: 28.7 pg (ref 26.0–34.0)
MCHC: 34.3 g/dL (ref 30.0–36.0)
MCV: 83.8 fL (ref 80.0–100.0)
Platelets: 161 K/uL (ref 150–400)
RBC: 4.07 MIL/uL — ABNORMAL LOW (ref 4.22–5.81)
RDW: 15.8 % — ABNORMAL HIGH (ref 11.5–15.5)
WBC: 10.3 K/uL (ref 4.0–10.5)
nRBC: 0 % (ref 0.0–0.2)

## 2023-12-06 LAB — GLUCOSE, CAPILLARY
Glucose-Capillary: 119 mg/dL — ABNORMAL HIGH (ref 70–99)
Glucose-Capillary: 151 mg/dL — ABNORMAL HIGH (ref 70–99)
Glucose-Capillary: 158 mg/dL — ABNORMAL HIGH (ref 70–99)
Glucose-Capillary: 163 mg/dL — ABNORMAL HIGH (ref 70–99)
Glucose-Capillary: 174 mg/dL — ABNORMAL HIGH (ref 70–99)
Glucose-Capillary: 208 mg/dL — ABNORMAL HIGH (ref 70–99)
Glucose-Capillary: 93 mg/dL (ref 70–99)

## 2023-12-06 LAB — HEPARIN LEVEL (UNFRACTIONATED)
Heparin Unfractionated: <0.1 [IU]/mL — ABNORMAL LOW (ref 0.30–0.70)
Heparin Unfractionated: 0.15 [IU]/mL — ABNORMAL LOW (ref 0.30–0.70)

## 2023-12-06 LAB — ECHOCARDIOGRAM COMPLETE
AR max vel: 2.68 cm2
AV Peak grad: 2.8 mmHg
Ao pk vel: 0.83 m/s
Area-P 1/2: 4.68 cm2
Height: 68 in
MV M vel: 2.52 m/s
MV Peak grad: 25.4 mmHg
S' Lateral: 4.6 cm
Weight: 2880 [oz_av]

## 2023-12-06 LAB — HEPATIC FUNCTION PANEL
ALT: 19 U/L (ref 0–44)
AST: 37 U/L (ref 15–41)
Albumin: 2.7 g/dL — ABNORMAL LOW (ref 3.5–5.0)
Alkaline Phosphatase: 60 U/L (ref 38–126)
Bilirubin, Direct: 0.3 mg/dL — ABNORMAL HIGH (ref 0.0–0.2)
Indirect Bilirubin: 0.7 mg/dL (ref 0.3–0.9)
Total Bilirubin: 1 mg/dL (ref 0.0–1.2)
Total Protein: 5.5 g/dL — ABNORMAL LOW (ref 6.5–8.1)

## 2023-12-06 LAB — PHOSPHORUS: Phosphorus: 2.8 mg/dL (ref 2.5–4.6)

## 2023-12-06 LAB — NA AND K (SODIUM & POTASSIUM), RAND UR
Potassium Urine: 79 mmol/L
Sodium, Ur: <10 mmol/L

## 2023-12-06 LAB — MAGNESIUM: Magnesium: 1.9 mg/dL (ref 1.7–2.4)

## 2023-12-06 LAB — STREP PNEUMONIAE URINARY ANTIGEN: Strep Pneumo Urinary Antigen: NEGATIVE

## 2023-12-06 LAB — OSMOLALITY, URINE: Osmolality, Ur: 527 mosm/kg (ref 300–900)

## 2023-12-06 MED ORDER — MELATONIN 5 MG PO TABS
5.0000 mg | ORAL_TABLET | Freq: Every evening | ORAL | Status: DC | PRN
  Administered 2023-12-06: 5 mg via ORAL
  Filled 2023-12-06: qty 1

## 2023-12-06 MED ORDER — TRAZODONE HCL 50 MG PO TABS
50.0000 mg | ORAL_TABLET | Freq: Every evening | ORAL | Status: DC | PRN
  Administered 2023-12-06 – 2023-12-30 (×21): 50 mg via ORAL
  Filled 2023-12-06 (×21): qty 1

## 2023-12-06 MED ORDER — POLYETHYLENE GLYCOL 3350 17 G PO PACK
17.0000 g | PACK | Freq: Two times a day (BID) | ORAL | Status: DC
  Administered 2023-12-06 – 2023-12-15 (×13): 17 g via ORAL
  Filled 2023-12-06 (×17): qty 1

## 2023-12-06 MED ORDER — HEPARIN BOLUS VIA INFUSION
2450.0000 [IU] | Freq: Once | INTRAVENOUS | Status: AC
  Administered 2023-12-06: 2450 [IU] via INTRAVENOUS
  Filled 2023-12-06: qty 2450

## 2023-12-06 MED ORDER — LACTATED RINGERS IV SOLN: INTRAVENOUS | Status: DC

## 2023-12-06 MED ORDER — BISACODYL 10 MG RE SUPP: 10.0000 mg | Freq: Every day | RECTAL | Status: DC | PRN

## 2023-12-06 MED ORDER — BISACODYL 5 MG PO TBEC
10.0000 mg | DELAYED_RELEASE_TABLET | Freq: Once | ORAL | Status: AC
  Administered 2023-12-06: 10 mg via ORAL
  Filled 2023-12-06: qty 2

## 2023-12-06 MED ORDER — MIDODRINE HCL 5 MG PO TABS
5.0000 mg | ORAL_TABLET | Freq: Three times a day (TID) | ORAL | Status: DC
  Administered 2023-12-06 – 2023-12-07 (×3): 5 mg via ORAL
  Filled 2023-12-06 (×4): qty 1

## 2023-12-06 MED ORDER — BISACODYL 5 MG PO TBEC
10.0000 mg | DELAYED_RELEASE_TABLET | Freq: Every day | ORAL | Status: DC
  Administered 2023-12-07 – 2023-12-14 (×6): 10 mg via ORAL
  Filled 2023-12-06 (×7): qty 2

## 2023-12-06 NOTE — Consult Note (Signed)
 PHARMACY - ANTICOAGULATION CONSULT NOTE  Pharmacy Consult for IV Heparin  Indication: chest pain/ACS  Patient Measurements: Height: 5' 8 (172.7 cm) Weight: 81.6 kg (180 lb) IBW/kg (Calculated) : 68.4 HEPARIN  DW (KG): 81.6  Labs: Recent Labs    12/04/23 1141 12/04/23 1822 12/04/23 2147 12/04/23 2244 12/05/23 0023 12/05/23 0519 12/05/23 0832 12/05/23 1235 12/05/23 1643 12/06/23 0611  HGB 14.3  --  13.3  --   --  11.5*  --   --   --  11.7*  HCT 42.9  --  38.3*  --   --  34.2*  --   --   --  34.1*  PLT 191  --  157  --   --  152  --   --   --  161  APTT  --   --  55*  --   --   --   --   --   --   --   LABPROT  --   --  17.0*  --   --   --   --   --   --   --   INR  --   --  1.3*  --   --   --   --   --   --   --   HEPARINUNFRC  --   --   --   --   --  <0.10*  --   --  <0.10* <0.10*  CREATININE 1.12  --  0.98 1.12  --  1.37*   < > 1.45* 1.35* 1.25*  CKTOTAL 115  --   --   --   --   --   --   --   --   --   TROPONINIHS 715* 6,596*  --  8,119* 7,607*  --   --   --   --   --    < > = values in this interval not displayed.    Estimated Creatinine Clearance: 45.6 mL/min (A) (by C-G formula based on SCr of 1.25 mg/dL (H)).   Medical History: Past Medical History:  Diagnosis Date   Acute ST elevation myocardial infarction (STEMI) of inferior wall (HCC) 04/19/2010   a.) transfered from [facility] to [facility] --> LHC/PCI (very difficult procedure) --> 3.0 x 23 mm and 3.0 x 12 mm Xience stents to RCA   Allergies    Arthritis    Benign essential hypertension    Bilateral carotid artery disease (HCC) 05/08/2021   a.) carotid doppler 05/08/2021: 1-39% BICA   CAD (coronary artery disease) 04/19/2010   a.) inferior STEMI 04/19/2010 --> LHC/PCI: 50-70% pD1, 80% pRI, 90/90/90% RCA (overlapping 3.0 x 23 and 3.0 x 12 mm Xience DES); b.) MV 11/10/2018: fixed minimally reversible inferior perfusion defect   Cellulitis of foot    Chronic HFrEF (heart failure with reduced ejection fraction) (HCC)     DDD (degenerative disc disease), cervical    Diabetes mellitus type 2, insulin  dependent (HCC)    Diverticulosis    Full dentures    Gout    Hard of hearing    History of bilateral cataract extraction 2022   History of ETOH abuse    Hyperlipidemia    Ischemic cardiomyopathy 04/19/2010   a.) TTE 04/19/2010: 40%; b.) TTE 04/20/2014: EF >55%; c.) TTE 11/10/2018: EF 45%; d.) TTE 11/14/2022: EF 25-30%; e. 06/2023 Echo: EF 25-30%, nl RV size/fxn. Mild MR. Mild-mod TR. Ao sclerosis w/o stenosis.   Long term current use of aspirin   Long term current use of clopidogrel     Lumbar degenerative disc disease    Lumbar radiculopathy    Lumbar vertebral fracture (chronic superior endplate of L1)    NSTEMI (non-ST elevated myocardial infarction) (HCC) 01/15/2023   OSA (obstructive sleep apnea)    a.) unable to tolerate nocturnal PAP therapy   Peripheral artery disease (HCC)    a.) stenting 05/14/21: 12 mm x 12 cm LifeStent RIGHT dis SFA/prox pop; b.) s/p cath directed thrombolysis RIGHT SFA/pop 06/14/21; c.) s/p mech thrombectomy + stenting 06/15/21: 8 mm x 25cm & 8 mm x 7.5cm Viabahn; d.) s/p BILAT CFA, profunda femoris, SFA endarterectomies + fogarty embolectomy + stenting 11/15/22: 12mm x 58mm Lifestream BILAT CIAs, 14 mm x 6 cm Lifestream & 13 mm x 5 cm Viabahn LEFT EIA   Peripheral neuropathy    Umbilical hernia     Medications:  No anticoagulation prior to admission per my chart review. Takes ASA / Plavix  at home  Assessment: 78 y/o M with medical history as above and including CAD, PAD presenting to the ED 8/14 with right common femoral artery hematoma with active extravasation. Patient underwent endovascular intervention same day. However, clinical picture complicated by rising troponin (715 >> 6596). Pharmacy consulted to dose heparin . IV heparin  was stopped this morning for bleeding in groin; vascular surgery now wants to resume  Baseline Labs aPTT 55s and INR 1.3  Baseline H&H, platelets are  within normal limits.  Goal of Therapy:  Heparin  level 0.3-0.7 units/ml Monitor platelets by anticoagulation protocol: Yes   Plan:  --resume heparin  at 1600 units/hr --recheck heparin  level 8 hours after resumption --Daily CBC per protocol while on IV heparin   Manuel [name] 12/06/2023,11:31 AM

## 2023-12-06 NOTE — Progress Notes (Signed)
 Patient complained of lack of sleep since hospitalization,patient states he is woken up frequently for lab draws and blood sugar checks.Patient requesting something for sleep,on call provider notified.Patient educated on the importance of  checks.Wife at bedside.

## 2023-12-06 NOTE — Progress Notes (Signed)
 2 Days Post-Op   Subjective/Chief Complaint: Small skin separation in groin crease with old hematoma/blood drainage overnight. HgB stable.   Objective: Vital signs in last 24 hours: Temp:  [97.9 F (36.6 C)-98.4 F (36.9 C)] 98 F (36.7 C) (08/16 0200) Pulse Rate:  [70-92] 76 (08/16 0700) Resp:  [10-23] 12 (08/16 0700) BP: (81-152)/(60-99) 109/66 (08/16 0700) SpO2:  [93 %-100 %] 99 % (08/16 0700) Last BM Date :  (PTA)  Intake/Output from previous day: 08/15 0701 - 08/16 0700 In: 4421 [P.O.:960; I.V.:3261; IV Piggyback:200] Out: 400 [Urine:400] Intake/Output this shift: No intake/output data recorded.  General appearance: alert and no distress Cardio: regular rate and rhythm Extremities: RIGHT groin- ecchymosis, hematoma smaller, firm, 0.5cm skin separation with old blood drainage/oozingNo evidence of expanding hematoma or active/acute bleeding.  Lab Results:  Recent Labs    12/05/23 0519 12/06/23 0611  WBC 14.5* 10.3  HGB 11.5* 11.7*  HCT 34.2* 34.1*  PLT 152 161   BMET Recent Labs    12/05/23 1643 12/06/23 0611  NA 130* 129*  K 4.6 4.0  CL 101 99  CO2 19* 21*  GLUCOSE 87 149*  BUN 30* 30*  CREATININE 1.35* 1.25*  CALCIUM  8.3* 7.9*   PT/INR Recent Labs    12/04/23 2147  LABPROT 17.0*  INR 1.3*   ABG Recent Labs    12/05/23 0023  HCO3 22.9    Studies/Results: US  RENAL Result Date: 12/05/2023 CLINICAL DATA:  Acute kidney injury. EXAM: RENAL / URINARY TRACT ULTRASOUND COMPLETE COMPARISON:  None Available. FINDINGS: Right Kidney: Renal measurements: 12.3 cm x 5.6 cm x 6.6 cm = volume: 237.0 mL. Echogenicity within normal limits. No mass or hydronephrosis visualized. Left Kidney: Renal measurements: 11.7 cm x 5.8 cm x 5.4 cm = volume: 192.8 mL. Echogenicity within normal limits. No mass or hydronephrosis visualized. Bladder: Appears normal for degree of bladder distention. Other: None. IMPRESSION: Unremarkable renal ultrasound. Electronically Signed    By: Suzen Dials M.D.   On: 12/05/2023 20:17   PERIPHERAL VASCULAR CATHETERIZATION Result Date: 12/04/2023 See surgical note for result.  CT CHEST ABDOMEN PELVIS W CONTRAST Result Date: 12/04/2023 CLINICAL DATA:  Sepsis.  Fall. EXAM: CT CHEST, ABDOMEN, AND PELVIS WITH CONTRAST TECHNIQUE: Multidetector CT imaging of the chest, abdomen and pelvis was performed following the standard protocol during bolus administration of intravenous contrast. RADIATION DOSE REDUCTION: This exam was performed according to the departmental dose-optimization program which includes automated exposure control, adjustment of the mA and/or kV according to patient size and/or use of iterative reconstruction technique. CONTRAST:  OMNIPAQUE  IOHEXOL  300 MG/ML  SOLN COMPARISON:  CT abdomen and pelvis 06/16/2021. FINDINGS: CT CHEST FINDINGS Cardiovascular: The heart is mildly enlarged. There is no pericardial effusion. There are atherosclerotic calcifications of the aorta and coronary arteries. Aorta is normal in size. Mediastinum/Nodes: No enlarged mediastinal, hilar, or axillary lymph nodes. Thyroid gland, trachea, and esophagus demonstrate no significant findings. Lungs/Pleura: There are mild patchy peripheral ground-glass opacities in the anterior right upper lobe with some focal pleural thickening. There are calcified granulomas bilaterally. The lungs are otherwise clear. There is no pleural effusion or pneumothorax. Musculoskeletal: There is some expansile cystic changes in the right glenoid and non expansile cystic changes in the left lung is cleared these are incompletely imaged. No other focal osseous lesions are identified. There is no acute fracture or dislocation identified. Multilevel degenerative changes affect the spine. There are healed right fourth through seventh rib fractures. CT ABDOMEN PELVIS FINDINGS Hepatobiliary: There is  diffuse fatty infiltration of the liver. There is gallbladder wall edema. No  radiopaque calculi are seen. There is no biliary ductal dilatation. Pancreas: Unremarkable. No pancreatic ductal dilatation or surrounding inflammatory changes. Spleen: Spleen is mildly enlarged. Adrenals/Urinary Tract: There is a 3 mm calculus in the distal right ureter. There is no right-sided hydronephrosis. Otherwise, the bilateral kidneys, adrenal glands and bladder are within normal limits. Stomach/Bowel: Stomach is within normal limits. Appendix appears normal. No evidence of bowel wall thickening, distention, or inflammatory changes. There is diffuse colonic diverticulosis, most significant in the sigmoid colon and descending colon. Vascular/Lymphatic: Infrarenal abdominal aortic aneurysm measures 3.2 cm similar to prior. There is severe atherosclerotic calcifications of the aorta. IVC is normal in size. There is a vascular stent in the left external iliac artery. There is hematoma surrounding the right common femoral artery measuring 6.6 x 4.9 by 7.0 cm. There is active extravasation of a large amount of contrast into the central hematoma with source at the anterior margin of the right common femoral artery. There is some scarring surrounding the left common femoral artery. No enlarged lymph nodes are identified. Reproductive: Prostate gland is mildly enlarged. Other: There is a small fat containing umbilical hernia. There is no ascites. There is no retroperitoneal hematoma. Musculoskeletal: There is severe degenerative changes throughout the spine. Chronic L1 compression fracture is unchanged. IMPRESSION: 1. Large hematoma surrounding the right common femoral artery with active arterial extravasation of contrast into the hematoma. 2. 3.2 cm infrarenal abdominal aortic aneurysm. Recommend follow-up every 3 years. 3. Mild patchy peripheral ground-glass opacities in the anterior right upper lobe with some focal pleural thickening. Findings are nonspecific and may be infectious/inflammatory. 4. Gallbladder  wall edema. No radiopaque calculi are seen. Correlate clinically for cholecystitis. 5. Nonobstructing 3 mm calculus in the distal right ureter. 6. Colonic diverticulosis. 7. Fatty infiltration of the liver. 8. Mild splenomegaly. Aortic Atherosclerosis (ICD10-I70.0). These results were called by telephone at the time of interpretation on 12/04/2023 at 5:49 pm to provider [facility] , who verbally acknowledged these results. Electronically Signed   By: Greig Pique M.D.   On: 12/04/2023 17:49   CT Head Wo Contrast Result Date: 12/04/2023 CLINICAL DATA:  Status post fall. EXAM: CT HEAD WITHOUT CONTRAST TECHNIQUE: Contiguous axial images were obtained from the base of the skull through the vertex without intravenous contrast. RADIATION DOSE REDUCTION: This exam was performed according to the departmental dose-optimization program which includes automated exposure control, adjustment of the mA and/or kV according to patient size and/or use of iterative reconstruction technique. COMPARISON:  April 09, 2021 FINDINGS: Brain: There is generalized cerebral atrophy with widening of the extra-axial spaces and ventricular dilatation. There are areas of decreased attenuation within the white matter tracts of the supratentorial brain, consistent with microvascular disease changes. Vascular: Marked severity bilateral cavernous carotid artery calcification is noted. Skull: Normal. Negative for fracture or focal lesion. Sinuses/Orbits: No acute finding. Other: None. IMPRESSION: 1. Generalized cerebral atrophy and microvascular disease changes of the supratentorial brain. 2. No acute intracranial abnormality. Electronically Signed   By: Suzen Dials M.D.   On: 12/04/2023 17:44   DG Shoulder Right Result Date: 12/04/2023 CLINICAL DATA:  Status post fall. EXAM: RIGHT SHOULDER - 2+ VIEW COMPARISON:  None Available. FINDINGS: There is no evidence of an acute fracture or dislocation. A chronic appearing deformity is seen  along the inferior medial aspect of the right humeral head and neck. Multiple chronic right-sided rib fractures are noted. Marked severity degenerative  changes are seen involving the right glenohumeral joint. Extensive chronic and degenerative changes are also present involving the right glenohumeral joint, in the form of joint space narrowing and extensive humeral and glenoid subchondral cyst formation. Soft tissues are unremarkable. IMPRESSION: Chronic and degenerative changes, as described above, without an acute osseous abnormality. Electronically Signed   By: Suzen Dials M.D.   On: 12/04/2023 17:41   CT Cervical Spine Wo Contrast Result Date: 12/04/2023 CLINICAL DATA:  Status post fall. EXAM: CT CERVICAL SPINE WITHOUT CONTRAST TECHNIQUE: Multidetector CT imaging of the cervical spine was performed without intravenous contrast. Multiplanar CT image reconstructions were also generated. RADIATION DOSE REDUCTION: This exam was performed according to the departmental dose-optimization program which includes automated exposure control, adjustment of the mA and/or kV according to patient size and/or use of iterative reconstruction technique. COMPARISON:  None Available. FINDINGS: Alignment: There is straightening of the normal cervical spine lordosis. Approximately 2 mm anterolisthesis of the C4 vertebral body is noted on C5. Skull base and vertebrae: No acute fracture. No primary bone lesion or focal pathologic process. Chronic and degenerative changes are seen involving the body and tip of the dens, as well as the adjacent portion of the anterior arch of C1. Soft tissues and spinal canal: No prevertebral fluid or swelling. No visible canal hematoma. Disc levels: Marked severity endplate sclerosis, anterior osteophyte formation and posterior bony spurring are seen at the levels of C4-C5, C5-C6, C6-C7 and C7-T1. Posterior bony spurring is also present at the level of C3-C4. There is marked severity narrowing  of the anterior atlantoaxial articulation. Marked severity intervertebral disc space narrowing is seen at C4-C5, C5-C6, C6-C7 and C7-T1. Bilateral marked severity multilevel facet joint hypertrophy is noted. Upper chest: Negative. Other: None. IMPRESSION: 1. No acute fracture or traumatic subluxation. 2. Marked severity multilevel degenerative changes, as described above. Electronically Signed   By: Suzen Dials M.D.   On: 12/04/2023 17:36   DG Chest 2 View Result Date: 12/04/2023 CLINICAL DATA:  Weakness. EXAM: DG CHEST 2V COMPARISON:  07/09/2023. FINDINGS: Stable cardiomegaly. Aortic atherosclerosis. No focal consolidation, pleural effusion, or pneumothorax. Remote healed right-sided rib fractures. Advanced degenerative changes of the bilateral glenohumeral joints. No acute osseous abnormality identified. IMPRESSION: 1. No acute cardiopulmonary findings. 2. Stable cardiomegaly. 3.  Aortic Atherosclerosis (ICD10-I70.0). Electronically Signed   By: Harrietta Sherry M.D.   On: 12/04/2023 12:34    Anti-infectives: Anti-infectives (From admission, onward)    Start     Dose/Rate Route Frequency Ordered Stop   12/06/23 0300  vancomycin  (VANCOREADY) IVPB 1250 mg/250 mL  Status:  Discontinued        1,250 mg 166.7 mL/hr over 90 Minutes Intravenous Every 24 hours 12/05/23 0103 12/05/23 0713   12/05/23 0145  ceFEPIme  (MAXIPIME ) 2 g in sodium chloride  0.9 % 100 mL IVPB        2 g 200 mL/hr over 30 Minutes Intravenous Every 12 hours 12/05/23 0049     12/05/23 0145  vancomycin  (VANCOREADY) IVPB 2000 mg/400 mL        2,000 mg 200 mL/hr over 120 Minutes Intravenous  Once 12/05/23 0049 12/05/23 0400   12/05/23 0100  azithromycin  (ZITHROMAX ) 500 mg in sodium chloride  0.9 % 250 mL IVPB  Status:  Discontinued        500 mg 250 mL/hr over 60 Minutes Intravenous Every 24 hours 12/05/23 0003 12/05/23 0713   12/05/23 0000  ceFAZolin  (ANCEF ) IVPB 1 g/50 mL premix  Status:  Discontinued  Note to Pharmacy:  Send with pt to OR   1 g 100 mL/hr over 30 Minutes Intravenous On call 12/04/23 1814 12/04/23 1922   12/04/23 2030  ceFAZolin  (ANCEF ) IVPB 2g/100 mL premix  Status:  Discontinued        2 g 200 mL/hr over 30 Minutes Intravenous  Once 12/04/23 2025 12/04/23 2122   12/04/23 1911  ceFAZolin  (ANCEF ) IVPB 1 g/50 mL premix        over 30 Minutes  Continuous PRN 12/04/23 1923 12/04/23 2032       Assessment/Plan: s/p Procedure(s): Lower Extremity Angiography (Right) POD # 2 S/P RIGHT CFA/EIA stenting; PTA PFA Skin separation with old blood drainage- 2- 2-0 nylon sutures placed as it was very superficial. Will monitor. Resume Heparin  gtt Continue supportive care.    LOS: 2 days    [name] 12/06/2023

## 2023-12-06 NOTE — Progress Notes (Signed)
 [facility] Progress Note  Patient: Manuel [name]    FMW:[id number]  DOA: 12/04/2023     Date of Service: the patient was seen and examined on 12/06/2023  Chief Complaint  Patient presents with   Weakness   Brief hospital course: Manuel [name] is a 78 y.o. 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 Patient fell with walker landing on top of him.  EMS was called for assistance.  Patient refused to be transported to ED for evaluation initial. However as time when on patient note more intense pain in right groin area and due to his EMS was called back and patient was transported to ED.      ED Course:  99.3/tmx 100.7 BP 131/72, hr 113, rr 19 sat 95%  UA :rare bacteria , ketones 20 ,  Wbc 11.6, hgb 14.3, plt 191, increase pmn Na 126 ( 137), K 5.3, cl91, glu 438, cr 1.12 Lactic 2.9 ,3.3 Trop 284,3403, 8119 EKG: sinus tachycardia , RAD twave change inferior lateral leads  Patient on evaluation found to have right femoral a rupture, vascular consulted and performed repair.     Patient course was further complicated by NTSEMI , s/p right femoral a repair patient was started on heparin  drip . Cardiology was consulted and will see patient in am , but plans for intervention  recommend medical management.   PCCM was involved for comanagement. [facility] was consulted for admission and further management as below.   Assessment and Plan:  # Right femoral artery Rupture s/p repair - plan of care as per vascular  -follow vascular recs    # NSTEMI -CAD s/p pic RCA c/b NSTEMI 06/2023 s/p stent x 3 to LAD Continue aspirin  and statin 8/15 started Plavix  continue heparin  drip  -  Follow TTE -Troponin peaked 8119 >> 7607 - Cardiology following    # CAP with Sepsis, Serratia bacteremia -  Opacities on chest imaging /fever/mild leukocytosis/lactic acidosis -s/p Vanco azithromycin  and cefepime  MRSA PCR negative Discontinued vancomycin  azithromycin , continue cefepime  for now Blood culture growing Serratia MRSA sensitive, sensitive reports pending     # DMII uncontrolled with hyperglycemia - AG13>>10 improved  -fs 394 >>93 improved Started Semglee  14 units nightly, NovoLog  sliding scale Monitor CBG, continue diabetic diet    # Isotonic hyponatremia, most likely nutritional deficiency Serum osmolality 280 WNL -gently ivfs  Monitor BMP Na 129     # CHF combined systolic diastolic  - no acute exacerbation , appear hypovolemic  - holding diuretic currently  -resume cardiac GDMT in am as able as bp and volume status allows    # PAD -s/p PTA -resume asa/plavix     # HTN  8/15 low blood pressure.  DC'd Coreg  Started midodrine  10 mg p.o. 3 times daily with holding parameters 8/16 BP improved, decrease midodrine  5 mg p.o. 3 times daily with holding parameters Monitor BP and titrate medication accordingly   # AKI due to hypotension versus contrast Continue IV fluid for hydration Started Mucomyst  600 mg p.o. twice daily for 2 days Monitor renal functions and urine output Check bladder scan Avoid nephrotoxic medications and use renally dose medications  sCr 1.25 and Co2 21  # Metabolic acidosis due to AKI 8/15  bicarbonate 100 mEq x 1 dose given  # Hyperkalemia secondary to AKI Lokelma  10 g x 2 doses given Monitor BMP daily   # OSA -02 at bedtime    # DJD of the spine -supportive care    # Hx of ETOH abuse -placed on ciwa   # Constipation: started laxatives   Body mass index is 27.37 kg/m.  Interventions:  Diet: CLD DVT Prophylaxis: Heparin  IV infusion   Advance goals of care discussion: Full code  Family Communication: family was  present at bedside, at the time of interview.  The pt provided permission to discuss medical plan with the family. Opportunity was given to ask question and all questions were answered satisfactorily.   Disposition:  Pt is from Home, admitted with sepsis, pneumonia, right femoral hematoma, non-STEMI, still very sick, which precludes a safe discharge. Discharge to home versus SNF, TBD, when stable, may need few days to improve.  Subjective: No significant events overnight, right groin pain and bleeding is under control.  Patient was resting comfortably, denied any chest pain or palpitation, no shortness of breath. Complaining of some discomfort in the left upper quadrant, has not had BM for 3 to 4 days, patient is thinking that once he passes the gas that he will feel better.  It is may be a gas pain. Patient agreed for laxatives.   Physical Exam: General: NAD, lying comfortably Appear in no distress, affect appropriate Eyes: PERRLA ENT: Oral Mucosa Clear, moist  Neck: no JVD,  Cardiovascular: S1 and S2 Present, no Murmur,  Respiratory: good respiratory effort, Bilateral Air entry equal and Decreased, no Crackles, no wheezes Abdomen: Bowel Sound present, Soft and no tenderness,  Skin: no rashes Extremities: s/p right femoral repair, dressing soaked with dried blood, no active bleeding noticed.   S/p right BKA. LLE mild edema and pressure ulcers, no calf tenderness Neurologic: without any new focal findings Gait not checked due to patient safety concerns  Vitals:   12/06/23 0552 12/06/23 0600 12/06/23 0700 12/06/23 0800  BP:  109/68 109/66   Pulse:  81 76   Resp: 15 10 12    Temp:    98 F (36.7 C)  TempSrc:    Oral  SpO2:  97% 99%   Weight:      Height:        Intake/Output Summary (Last 24 hours) at 12/06/2023 1345 Last data filed at 12/06/2023 0600 Gross per 24 hour  Intake 4421.04 ml  Output 400 ml  Net 4021.04 ml   Filed Weights   12/04/23 1138  Weight: 81.6 kg     Data Reviewed: I have personally reviewed and interpreted daily labs, tele strips, imagings as discussed above. I reviewed all nursing notes, pharmacy notes, vitals, pertinent old records I have discussed plan of care as described above with RN and patient/family.  CBC: Recent Labs  Lab 12/04/23 1141 12/04/23 2147 12/05/23 0519 12/06/23 0611  WBC 11.6* 11.0* 14.5* 10.3  NEUTROABS 10.5*  --   --   --   HGB 14.3 13.3 11.5* 11.7*  HCT 42.9 38.3* 34.2* 34.1*  MCV 84.6 82.2 85.5 83.8  PLT 191 157 152 161   Basic Metabolic Panel: Recent Labs  Lab 12/05/23 0519 12/05/23 0832 12/05/23 1235 12/05/23 1643 12/06/23 0611  NA 126* 131* 130* 130* 129*  K 5.6* 5.4* 4.5 4.6 4.0  CL 97* 98 98 101 99  CO2 21*  20* 21* 19* 21*  GLUCOSE 296* 234* 143* 87 149*  BUN 27* 26* 31* 30* 30*  CREATININE 1.37* 1.44* 1.45* 1.35* 1.25*  CALCIUM  8.1* 8.3* 8.0* 8.3* 7.9*  MG  --   --   --   --  1.9  PHOS  --   --   --   --  2.8    Studies: US  RENAL Result Date: 12/05/2023 CLINICAL DATA:  Acute kidney injury. EXAM: RENAL / URINARY TRACT ULTRASOUND COMPLETE COMPARISON:  None Available. FINDINGS: Right Kidney: Renal measurements: 12.3 cm x 5.6 cm x 6.6 cm = volume: 237.0 mL. Echogenicity within normal limits. No mass or hydronephrosis visualized. Left Kidney: Renal measurements: 11.7 cm x 5.8 cm x 5.4 cm = volume: 192.8 mL. Echogenicity within normal limits. No mass or hydronephrosis visualized. Bladder: Appears normal for degree of bladder distention. Other: None. IMPRESSION: Unremarkable renal ultrasound. Electronically Signed   By: Suzen Dials M.D.   On: 12/05/2023 20:17    Scheduled Meds:  acetylcysteine   600 mg Oral BID   aspirin  EC  81 mg Oral Daily   atorvastatin   80 mg Oral QHS   Chlorhexidine  Gluconate Cloth  6 each Topical Daily   clopidogrel   75 mg Oral Daily   folic acid   1 mg Oral Daily   insulin  aspart  0-15 Units Subcutaneous Q4H   insulin  glargine-yfgn  14 Units Subcutaneous  Q2200   midodrine   5 mg Oral TID WC   multivitamin with minerals  1 tablet Oral Daily   sodium chloride  flush  3 mL Intravenous Q12H   thiamine   100 mg Oral Daily   Or   thiamine   100 mg Intravenous Daily   Continuous Infusions:  ceFEPime  (MAXIPIME ) IV 2 g (12/06/23 0903)   heparin  1,600 Units/hr (12/06/23 1141)   lactated ringers  75 mL/hr at 12/06/23 1123   PRN Meds: acetaminophen , albuterol , dextrose , LORazepam  **OR** LORazepam , morphine  injection, ondansetron  (ZOFRAN ) IV, oxyCODONE , sodium chloride  flush  Time spent: 40 minutes  Author: ELVAN SOR. MD [facility] 12/06/2023 1:45 PM  To reach On-call, see care teams to locate the attending and reach out to them via [email or web address]. If 7PM-7AM, please contact night-coverage If you still have difficulty reaching the attending provider, please page the [facility] (Director on Call) for [facility] on amion for assistance.

## 2023-12-06 NOTE — Progress Notes (Signed)
 Progress Note  Patient Name: Manuel [name] Date of Encounter: 12/06/2023  Primary Cardiologist: [name]  Subjective   No symptoms of angina or cardiac decompensation.   Inpatient Medications    Scheduled Meds:  acetylcysteine   600 mg Oral BID   aspirin  EC  81 mg Oral Daily   atorvastatin   80 mg Oral QHS   Chlorhexidine  Gluconate Cloth  6 each Topical Daily   clopidogrel   75 mg Oral Daily   folic acid   1 mg Oral Daily   insulin  aspart  0-15 Units Subcutaneous Q4H   insulin  glargine-yfgn  14 Units Subcutaneous Q2200   midodrine   10 mg Oral TID WC   multivitamin with minerals  1 tablet Oral Daily   sodium chloride  flush  3 mL Intravenous Q12H   thiamine   100 mg Oral Daily   Or   thiamine   100 mg Intravenous Daily   Continuous Infusions:  ceFEPime  (MAXIPIME ) IV Stopped (12/05/23 1714)   heparin  Stopped (12/06/23 0557)   lactated ringers  75 mL/hr at 12/06/23 0600   PRN Meds: acetaminophen , albuterol , dextrose , LORazepam  **OR** LORazepam , morphine  injection, ondansetron  (ZOFRAN ) IV, oxyCODONE , sodium chloride  flush   Vital Signs    Vitals:   12/06/23 0514 12/06/23 0552 12/06/23 0600 12/06/23 0700  BP:   109/68 109/66  Pulse:   81 76  Resp: 12 15 10 12   Temp:      TempSrc:      SpO2:   97% 99%  Weight:      Height:        Intake/Output Summary (Last 24 hours) at 12/06/2023 0756 Last data filed at 12/06/2023 0600 Gross per 24 hour  Intake 4421.04 ml  Output 400 ml  Net 4021.04 ml   Filed Weights   12/04/23 1138  Weight: 81.6 kg    Telemetry    SR with occasional isolated PVCs - Personally Reviewed  ECG    No new tracings - Personally Reviewed  Physical Exam   GEN: No acute distress.   Neck: No JVD. Cardiac: RRR, I/VI systolic murmur, no rubs, or gallops.  Respiratory: Clear to auscultation bilaterally.  GI: Soft, nontender, non-distended.   MS: No edema; status post right BKA. Neuro:  Alert and oriented x 3; Nonfocal.  Psych: Normal  affect.  Labs    Chemistry Recent Labs  Lab 12/04/23 2147 12/04/23 2244 12/05/23 0519 12/05/23 0832 12/05/23 1235 12/05/23 1643 12/06/23 0611  NA 124*   < > 126*   < > 130* 130* 129*  K 5.2*   < > 5.6*   < > 4.5 4.6 4.0  CL 93*   < > 97*   < > 98 101 99  CO2 19*   < > 21*   < > 21* 19* 21*  GLUCOSE 395*   < > 296*   < > 143* 87 149*  BUN 22   < > 27*   < > 31* 30* 30*  CREATININE 0.98   < > 1.37*   < > 1.45* 1.35* 1.25*  CALCIUM  8.6*   < > 8.1*   < > 8.0* 8.3* 7.9*  PROT 6.5  --  5.9*  --   --   --  5.5*  ALBUMIN 3.1*  --  2.7*  --   --   --  2.7*  AST 44*  --  42*  --   --   --  37  ALT 17  --  17  --   --   --  19  ALKPHOS 77  --  65  --   --   --  60  BILITOT 2.0*  --  1.7*  --   --   --  1.0  GFRNONAA >60   < > 52*   < > 49* 53* 58*  ANIONGAP 12   < > 8   < > 11 10 9    < > = values in this interval not displayed.     Hematology Recent Labs  Lab 12/04/23 2147 12/05/23 0519 12/06/23 0611  WBC 11.0* 14.5* 10.3  RBC 4.66 4.00* 4.07*  HGB 13.3 11.5* 11.7*  HCT 38.3* 34.2* 34.1*  MCV 82.2 85.5 83.8  MCH 28.5 28.8 28.7  MCHC 34.7 33.6 34.3  RDW 15.3 15.5 15.8*  PLT 157 152 161    Cardiac EnzymesNo results for input(s): TROPONINI in the last 168 hours. No results for input(s): TROPIPOC in the last 168 hours.   BNP Recent Labs  Lab 12/04/23 2147  BNP 3,374.0*     DDimer No results for input(s): DDIMER in the last 168 hours.   Radiology    US  RENAL Result Date: 12/05/2023 IMPRESSION: Unremarkable renal ultrasound. Electronically Signed   By: Suzen Dials M.D.   On: 12/05/2023 20:17   CT CHEST ABDOMEN PELVIS W CONTRAST Result Date: 12/04/2023 IMPRESSION: 1. Large hematoma surrounding the right common femoral artery with active arterial extravasation of contrast into the hematoma. 2. 3.2 cm infrarenal abdominal aortic aneurysm. Recommend follow-up every 3 years. 3. Mild patchy peripheral ground-glass opacities in the anterior right upper lobe with  some focal pleural thickening. Findings are nonspecific and may be infectious/inflammatory. 4. Gallbladder wall edema. No radiopaque calculi are seen. Correlate clinically for cholecystitis. 5. Nonobstructing 3 mm calculus in the distal right ureter. 6. Colonic diverticulosis. 7. Fatty infiltration of the liver. 8. Mild splenomegaly. Aortic Atherosclerosis (ICD10-I70.0). These results were called by telephone at the time of interpretation on 12/04/2023 at 5:49 pm to provider [facility] , who verbally acknowledged these results. Electronically Signed   By: Greig Pique M.D.   On: 12/04/2023 17:49   CT Head Wo Contrast Result Date: 12/04/2023 IMPRESSION: 1. Generalized cerebral atrophy and microvascular disease changes of the supratentorial brain. 2. No acute intracranial abnormality. Electronically Signed   By: Suzen Dials M.D.   On: 12/04/2023 17:44   DG Shoulder Right Result Date: 12/04/2023 IMPRESSION: Chronic and degenerative changes, as described above, without an acute osseous abnormality. Electronically Signed   By: Suzen Dials M.D.   On: 12/04/2023 17:41   CT Cervical Spine Wo Contrast Result Date: 12/04/2023 IMPRESSION: 1. No acute fracture or traumatic subluxation. 2. Marked severity multilevel degenerative changes, as described above. Electronically Signed   By: Suzen Dials M.D.   On: 12/04/2023 17:36   DG Chest 2 View Result Date: 12/04/2023 IMPRESSION: 1. No acute cardiopulmonary findings. 2. Stable cardiomegaly. 3.  Aortic Atherosclerosis (ICD10-I70.0). Electronically Signed   By: Harrietta Sherry M.D.   On: 12/04/2023 12:34    Cardiac Studies   [facility] 07/14/2023:   Ost RCA to Prox RCA lesion is 100% stenosed.   Ramus lesion is 70% stenosed.   1st Diag lesion is 99% stenosed.   Prox Cx lesion is 95% stenosed.   Dist LM lesion is 30% stenosed.   Mid LAD lesion is 90% stenosed.   Prox LAD to Mid LAD lesion is 80% stenosed.   Ost LAD to Prox LAD lesion is 50%  stenosed.   A  drug-eluting stent was successfully placed using a STENT ONYX FRONTIER 2.25X38.   A drug-eluting stent was successfully placed using a STENT ONYX FRONTIER 2.5X26.   A drug-eluting stent was successfully placed using a STENT ONYX FRONTIER 2.75X15.   Post intervention, there is a 0% residual stenosis.   Post intervention, there is a 0% residual stenosis.   Post intervention, there is a 0% residual stenosis.   1.  Severe three-vessel coronary artery disease. 2.  Left ventricular angiography was not performed.  EF was severely reduced by echo. 3.  Right heart catheterization showed moderately elevated right and left-sided filling pressures, moderate to severe pulmonary hypertension and moderately reduced cardiac output. 4.  Successful angioplasty and 3 overlapped drug-eluting stent placement to the LAD.  Extremely difficult procedure due to tortuosity and calcifications.  There was a proximal edge dissection that required adding a third stent all the way back to the ostium of the LAD.  The patient had significant chest pain and hypotension with the telescope catheter in place.  He was started on nitroglycerin  drip at the end.   Recommendations: I felt that the patient was not a candidate for CABG and thus I proceeded with LAD PCI.  Will transfer the patient to stepdown ICU given chest pain postprocedure and will use nitroglycerin  drip.  Recommend medical therapy for the rest of his coronary artery disease. Continue IV diuresis for heart failure. __________   2D echo 07/10/2023: 1. Left ventricular ejection fraction, by estimation, is 25 to 30%. The  left ventricle has severely decreased function. The left ventricle  demonstrates regional wall motion abnormalities (see scoring  diagram/findings for description). The left  ventricular internal cavity size was moderately dilated. Left ventricular  diastolic parameters were normal. The global longitudinal strain is  normal.   2. Right  ventricular systolic function is normal. The right ventricular  size is normal. There is normal pulmonary artery systolic pressure.   3. The mitral valve is normal in structure. Mild mitral valve  regurgitation. No evidence of mitral stenosis.   4. Tricuspid valve regurgitation is mild to moderate.   5. The aortic valve is normal in structure. Aortic valve regurgitation is  not visualized. Aortic valve sclerosis is present, with no evidence of  aortic valve stenosis.   6. The inferior vena cava is normal in size with greater than 50%  respiratory variability, suggesting right atrial pressure of 3 mmHg.  __________   2D echo 11/14/2022: 1. Left ventricular ejection fraction, by estimation, is 25 to 30%. The  left ventricle has severely decreased function. The left ventricle  demonstrates regional wall motion abnormalities (see scoring  diagram/findings for description). Left ventricular  diastolic parameters were normal.   2. Right ventricular systolic function is normal. The right ventricular  size is normal.   3. The mitral valve is normal in structure. Mild to moderate mitral valve  regurgitation. No evidence of mitral stenosis.   4. Tricuspid valve regurgitation is mild to moderate.   5. The aortic valve is normal in structure. Aortic valve regurgitation is  not visualized. No aortic stenosis is present.   6. The inferior vena cava is normal in size with greater than 50%  respiratory variability, suggesting right atrial pressure of 3 mmHg.  __________   [facility] 11/10/2018: LVEF= 45%   FINDINGS:  Regional wall motion:  demonstrates  hypokinesis of the inferior wall.  The overall quality of the study is fair.   Artifacts noted: no  Left ventricular cavity:  normal.   Perfusion Analysis:  SPECT images demonstrate moderate perfusion  abnormality of moderate intensity is present in the inferior region on the  stress images.  Resting images show moderate perfusion abnormality  of  moderate intensity of the inferior myocardial region with slight  improvements consistent with previous infarct and/or scar as well as with  minimal amount of myocardial ischemia and reversibility defect type :  Mixed    __________   2D echo 11/10/2018: INTERPRETATION  MILD LV SYSTOLIC DYSFUNCTION WITH AN ESTIMATED EF = 40-45 %  NORMAL RIGHT VENTRICULAR SYSTOLIC FUNCTION  MILD-TO-MODERATE MITRAL VALVE INSUFFICIENCY  TRACE TRICUSPID VALVE INSUFFICIENCY  NO VALVULAR STENOSIS  INFERIOR WALL HYPOKINESIS  __________   2D echo 04/20/2014: INTERPRETATION  NORMAL LEFT VENTRICULAR SYSTOLIC FUNCTION  NORMAL RIGHT VENTRICULAR SYSTOLIC FUNCTION  MILD VALVULAR REGURGITATION (See above)  NO VALVULAR STENOSIS __________   [facility] 04/19/2010: SUMMARY:  1. Heavy calcification throughout the coronary system.  2. Ramus intermedius with proximal 80% narrowing.  3. Large first diagonal branch with a 50% to 70% proximal narrowing.  4. Heavily calcified and complex tortuous mid right coronary artery lesion    with multiple sequential 90% lesions.  This was successfully dilated and    stented using overlapping 3 mm Xience stents of 23 mm and 12 mm    postdilated to 3.4 mm.  __________   Remaining remote cardiac imaging unavailable for review  Patient Profile     78 y.o. male with history of CAD status post PCI to the mid RCA in 2011 status post PCI/DES x 3 to the LAD in 06/2023, extensive PAD status post bilateral lower extremity percutaneous interventions and most recently right BKA in 03/2019 for, HFrEF secondary to ICM, DM 2, HTN, HLD, OSA, depression, alcohol  use, osteoarthritis, and gout who is being seen today for the evaluation of elevated troponin at the request of Dr. Debby.   Assessment & Plan    1. CAD involving the native coronary arteries with elevated high-sensitivity troponin: - Never with symptoms of angina or cardiac decompensation - Suspect the elevated troponin is related to  supply/demand ischemia in the setting of recent fall in the context of GI illness with poor oral intake with underlying CAD  - He had missed antiplatelet therapy leading up to his admission since 11/30/2023 in the setting of GI illness - Currently on heparin  drip - PTA aspirin  81 mg, Plavix  75 mg and atorvastatin  80 mg - Echo pending - Evaluated by interventional cardiology with recommendation for continued medical therapy at this time given lack of anginal symptoms   2.  HFrEF secondary to ICM: - Currently appears euvolemic - GDMT has been held in the setting of relative hypotension, resume as able - No active indication for IV diuresis at this time - Repeat echo   3.  PAD status post right BKA complicated by mechanical fall leading to right common femoral artery hematoma with extravasation: - Status post repair by vascular surgery - Hemoglobin stable   3.  AAA: - Noted on CT imaging this admission measuring 3.2 cm with radiology recommended to follow-up in 3 years - Optimal blood pressure control   4.  HTN: - Blood pressure stable - On midodrine  10 mg tid, wean as/if able   5.  HLD: - PTA atorvastatin  80 mg     For questions or updates, please contact [facility] Please consult [email or web address] for contact info under Cardiology/STEMI.    Signed, Bernardino Bring, PA-C [facility] Pager: [phone]  12/06/2023, 7:56 AM

## 2023-12-06 NOTE — Consult Note (Addendum)
 PHARMACY - ANTICOAGULATION CONSULT NOTE  Pharmacy Consult for IV Heparin  Indication: chest pain/ACS  Patient Measurements: Height: 5' 8 (172.7 cm) Weight: 81.6 kg (180 lb) IBW/kg (Calculated) : 68.4 HEPARIN  DW (KG): 81.6  Labs: Recent Labs    12/04/23 1141 12/04/23 1822 12/04/23 2147 12/04/23 2244 12/05/23 0023 12/05/23 0519 12/05/23 0832 12/05/23 1235 12/05/23 1643  HGB 14.3  --  13.3  --   --  11.5*  --   --   --   HCT 42.9  --  38.3*  --   --  34.2*  --   --   --   PLT 191  --  157  --   --  152  --   --   --   APTT  --   --  55*  --   --   --   --   --   --   LABPROT  --   --  17.0*  --   --   --   --   --   --   INR  --   --  1.3*  --   --   --   --   --   --   HEPARINUNFRC  --   --   --   --   --  <0.10*  --   --  <0.10*  CREATININE 1.12  --  0.98 1.12  --  1.37* 1.44* 1.45* 1.35*  CKTOTAL 115  --   --   --   --   --   --   --   --   TROPONINIHS 715* 6,596*  --  8,119* 7,607*  --   --   --   --     Estimated Creatinine Clearance: 42.2 mL/min (A) (by C-G formula based on SCr of 1.35 mg/dL (H)).   Medical History: Past Medical History:  Diagnosis Date   Acute ST elevation myocardial infarction (STEMI) of inferior wall (HCC) 04/19/2010   a.) transfered from [facility] to [facility] --> LHC/PCI (very difficult procedure) --> 3.0 x 23 mm and 3.0 x 12 mm Xience stents to RCA   Allergies    Arthritis    Benign essential hypertension    Bilateral carotid artery disease (HCC) 05/08/2021   a.) carotid doppler 05/08/2021: 1-39% BICA   CAD (coronary artery disease) 04/19/2010   a.) inferior STEMI 04/19/2010 --> LHC/PCI: 50-70% pD1, 80% pRI, 90/90/90% RCA (overlapping 3.0 x 23 and 3.0 x 12 mm Xience DES); b.) MV 11/10/2018: fixed minimally reversible inferior perfusion defect   Cellulitis of foot    Chronic HFrEF (heart failure with reduced ejection fraction) (HCC)    DDD (degenerative disc disease), cervical    Diabetes mellitus type 2, insulin  dependent (HCC)    Diverticulosis     Full dentures    Gout    Hard of hearing    History of bilateral cataract extraction 2022   History of ETOH abuse    Hyperlipidemia    Ischemic cardiomyopathy 04/19/2010   a.) TTE 04/19/2010: 40%; b.) TTE 04/20/2014: EF >55%; c.) TTE 11/10/2018: EF 45%; d.) TTE 11/14/2022: EF 25-30%; e. 06/2023 Echo: EF 25-30%, nl RV size/fxn. Mild MR. Mild-mod TR. Ao sclerosis w/o stenosis.   Long term current use of aspirin     Long term current use of clopidogrel     Lumbar degenerative disc disease    Lumbar radiculopathy    Lumbar vertebral fracture (chronic superior endplate of L1)    NSTEMI (non-ST  elevated myocardial infarction) (HCC) 01/15/2023   OSA (obstructive sleep apnea)    a.) unable to tolerate nocturnal PAP therapy   Peripheral artery disease (HCC)    a.) stenting 05/14/21: 12 mm x 12 cm LifeStent RIGHT dis SFA/prox pop; b.) s/p cath directed thrombolysis RIGHT SFA/pop 06/14/21; c.) s/p mech thrombectomy + stenting 06/15/21: 8 mm x 25cm & 8 mm x 7.5cm Viabahn; d.) s/p BILAT CFA, profunda femoris, SFA endarterectomies + fogarty embolectomy + stenting 11/15/22: 12mm x 58mm Lifestream BILAT CIAs, 14 mm x 6 cm Lifestream & 13 mm x 5 cm Viabahn LEFT EIA   Peripheral neuropathy    Umbilical hernia     Medications:  No anticoagulation prior to admission per my chart review. Takes ASA / Plavix  at home  Assessment: 78 y/o M with medical history as above and including CAD, PAD presenting to the ED 8/14 with right common femoral artery hematoma with active extravasation. Patient underwent endovascular intervention same day. However, clinical picture complicated by rising troponin (715 >> [phone]). Pharmacy consulted to dose heparin .  Baseline aPTT and INR are pending. Baseline H&H, platelets are within normal limits.  Goal of Therapy:  Heparin  level 0.3-0.7 units/ml Monitor platelets by anticoagulation protocol: Yes   Plan:  8/16:   - Heparin  gtt d/c'd @ ~ 0438 due to bleeding in groin, restarted @  0533 so off for about 1 hr.  NP aware. - Heparin  gtt d/c'd again @ 0600 per Vascular (Dr Tisa) until MD sees pt later in AM  - HL originally ordered for 0300 but still not drawn as of 0600, called lab to f/u about getting level drawn   8/16:  HL @ 0611 = < 0.1, SUBtherapeutic - Heparin  gtt currently on hold d/t bleeding - F/U plans for anticoag with MD later in AM (Dr Tisa) --Daily CBC per protocol while on IV heparin   Manuel [name] D 12/06/2023,5:15 AM

## 2023-12-06 NOTE — Consult Note (Signed)
 PHARMACY - ANTICOAGULATION CONSULT NOTE  Pharmacy Consult for IV Heparin  Indication: chest pain/ACS  Patient Measurements: Height: 5' 8 (172.7 cm) Weight: 81.6 kg (180 lb) IBW/kg (Calculated) : 68.4 HEPARIN  DW (KG): 81.6  Labs: Recent Labs    12/04/23 1141 12/04/23 1822 12/04/23 2147 12/04/23 2147 12/04/23 2244 12/05/23 0023 12/05/23 0519 12/05/23 0832 12/05/23 1235 12/05/23 1643 12/06/23 0611 12/06/23 2057  HGB 14.3  --  13.3  --   --   --  11.5*  --   --   --  11.7*  --   HCT 42.9  --  38.3*  --   --   --  34.2*  --   --   --  34.1*  --   PLT 191  --  157  --   --   --  152  --   --   --  161  --   APTT  --   --  55*  --   --   --   --   --   --   --   --   --   LABPROT  --   --  17.0*  --   --   --   --   --   --   --   --   --   INR  --   --  1.3*  --   --   --   --   --   --   --   --   --   HEPARINUNFRC  --   --   --    < >  --   --  <0.10*  --   --  <0.10* <0.10* 0.15*  CREATININE 1.12  --  0.98  --  1.12  --  1.37*   < > 1.45* 1.35* 1.25*  --   CKTOTAL 115  --   --   --   --   --   --   --   --   --   --   --   TROPONINIHS 715* 6,596*  --   --  8,119* 7,607*  --   --   --   --   --   --    < > = values in this interval not displayed.    Estimated Creatinine Clearance: 45.6 mL/min (A) (by C-G formula based on SCr of 1.25 mg/dL (H)).   Medical History: Past Medical History:  Diagnosis Date   Acute ST elevation myocardial infarction (STEMI) of inferior wall (HCC) 04/19/2010   a.) transfered from [facility] to [facility] --> LHC/PCI (very difficult procedure) --> 3.0 x 23 mm and 3.0 x 12 mm Xience stents to RCA   Allergies    Arthritis    Benign essential hypertension    Bilateral carotid artery disease (HCC) 05/08/2021   a.) carotid doppler 05/08/2021: 1-39% BICA   CAD (coronary artery disease) 04/19/2010   a.) inferior STEMI 04/19/2010 --> LHC/PCI: 50-70% pD1, 80% pRI, 90/90/90% RCA (overlapping 3.0 x 23 and 3.0 x 12 mm Xience DES); b.) MV 11/10/2018: fixed minimally  reversible inferior perfusion defect   Cellulitis of foot    Chronic HFrEF (heart failure with reduced ejection fraction) (HCC)    DDD (degenerative disc disease), cervical    Diabetes mellitus type 2, insulin  dependent (HCC)    Diverticulosis    Full dentures    Gout    Hard of hearing    History of bilateral cataract extraction  2022   History of ETOH abuse    Hyperlipidemia    Ischemic cardiomyopathy 04/19/2010   a.) TTE 04/19/2010: 40%; b.) TTE 04/20/2014: EF >55%; c.) TTE 11/10/2018: EF 45%; d.) TTE 11/14/2022: EF 25-30%; e. 06/2023 Echo: EF 25-30%, nl RV size/fxn. Mild MR. Mild-mod TR. Ao sclerosis w/o stenosis.   Long term current use of aspirin     Long term current use of clopidogrel     Lumbar degenerative disc disease    Lumbar radiculopathy    Lumbar vertebral fracture (chronic superior endplate of L1)    NSTEMI (non-ST elevated myocardial infarction) (HCC) 01/15/2023   OSA (obstructive sleep apnea)    a.) unable to tolerate nocturnal PAP therapy   Peripheral artery disease (HCC)    a.) stenting 05/14/21: 12 mm x 12 cm LifeStent RIGHT dis SFA/prox pop; b.) s/p cath directed thrombolysis RIGHT SFA/pop 06/14/21; c.) s/p mech thrombectomy + stenting 06/15/21: 8 mm x 25cm & 8 mm x 7.5cm Viabahn; d.) s/p BILAT CFA, profunda femoris, SFA endarterectomies + fogarty embolectomy + stenting 11/15/22: 12mm x 58mm Lifestream BILAT CIAs, 14 mm x 6 cm Lifestream & 13 mm x 5 cm Viabahn LEFT EIA   Peripheral neuropathy    Umbilical hernia     Medications:  No anticoagulation prior to admission per my chart review. Takes ASA / Plavix  at home  Assessment: 78 y/o M with medical history as above and including CAD, PAD presenting to the ED 8/14 with right common femoral artery hematoma with active extravasation. Patient underwent endovascular intervention same day. However, clinical picture complicated by rising troponin (715 >> [phone]). Pharmacy consulted to dose heparin . IV heparin  was stopped this  morning for bleeding in groin; vascular surgery now wants to resume  Baseline Labs aPTT 55s and INR 1.3  Baseline H&H, platelets are within normal limits.  Goal of Therapy:  Heparin  level 0.3-0.7 units/ml Monitor platelets by anticoagulation protocol: Yes   Plan:  8/16:  HL @ 2057 = 0.15, SUBtherapeutic - will order heparin  2450 units IV X 1 bolus and increase drip rate to 1900 units/hr - recheck HL 8 hrs after rate change - CBC daily   Aston Lawhorn D 12/06/2023,9:59 PM

## 2023-12-07 DIAGNOSIS — I772 Rupture of artery: Secondary | ICD-10-CM | POA: Diagnosis not present

## 2023-12-07 LAB — GLUCOSE, CAPILLARY
Glucose-Capillary: 153 mg/dL — ABNORMAL HIGH (ref 70–99)
Glucose-Capillary: 112 mg/dL — ABNORMAL HIGH (ref 70–99)
Glucose-Capillary: 134 mg/dL — ABNORMAL HIGH (ref 70–99)
Glucose-Capillary: 177 mg/dL — ABNORMAL HIGH (ref 70–99)
Glucose-Capillary: 211 mg/dL — ABNORMAL HIGH (ref 70–99)
Glucose-Capillary: 149 mg/dL — ABNORMAL HIGH (ref 70–99)

## 2023-12-07 LAB — BASIC METABOLIC PANEL WITH GFR
Anion gap: 11 (ref 5–15)
BUN: 29 mg/dL — ABNORMAL HIGH (ref 8–23)
CO2: 21 mmol/L — ABNORMAL LOW (ref 22–32)
Calcium: 8.3 mg/dL — ABNORMAL LOW (ref 8.9–10.3)
Chloride: 101 mmol/L (ref 98–111)
Creatinine, Ser: 1.04 mg/dL (ref 0.61–1.24)
GFR, Estimated: >60 mL/min (ref >60)
Glucose, Bld: 138 mg/dL — ABNORMAL HIGH (ref 70–99)
Potassium: 4.1 mmol/L (ref 3.5–5.1)
Sodium: 133 mmol/L — ABNORMAL LOW (ref 135–145)

## 2023-12-07 LAB — HEPATIC FUNCTION PANEL
ALT: 26 U/L (ref 0–44)
AST: 33 U/L (ref 15–41)
Albumin: 2.7 g/dL — ABNORMAL LOW (ref 3.5–5.0)
Alkaline Phosphatase: 73 U/L (ref 38–126)
Bilirubin, Direct: 0.3 mg/dL — ABNORMAL HIGH (ref 0.0–0.2)
Indirect Bilirubin: 0.6 mg/dL (ref 0.3–0.9)
Total Bilirubin: 0.9 mg/dL (ref 0.0–1.2)
Total Protein: 5.6 g/dL — ABNORMAL LOW (ref 6.5–8.1)

## 2023-12-07 LAB — MAGNESIUM: Magnesium: 2 mg/dL (ref 1.7–2.4)

## 2023-12-07 LAB — CULTURE, BLOOD (ROUTINE X 2)
Special Requests: ADEQUATE
Special Requests: ADEQUATE

## 2023-12-07 LAB — CBC
HCT: 32.7 % — ABNORMAL LOW (ref 39.0–52.0)
Hemoglobin: 11.3 g/dL — ABNORMAL LOW (ref 13.0–17.0)
MCH: 28.7 pg (ref 26.0–34.0)
MCHC: 34.6 g/dL (ref 30.0–36.0)
MCV: 83 fL (ref 80.0–100.0)
Platelets: 168 K/uL (ref 150–400)
RBC: 3.94 MIL/uL — ABNORMAL LOW (ref 4.22–5.81)
RDW: 15.9 % — ABNORMAL HIGH (ref 11.5–15.5)
WBC: 8.2 K/uL (ref 4.0–10.5)
nRBC: 0 % (ref 0.0–0.2)

## 2023-12-07 LAB — PHOSPHORUS: Phosphorus: 2.7 mg/dL (ref 2.5–4.6)

## 2023-12-07 LAB — HEPARIN LEVEL (UNFRACTIONATED)
Heparin Unfractionated: 0.27 [IU]/mL — ABNORMAL LOW (ref 0.30–0.70)
Heparin Unfractionated: 0.52 [IU]/mL (ref 0.30–0.70)
Heparin Unfractionated: 0.42 [IU]/mL (ref 0.30–0.70)

## 2023-12-07 MED ORDER — ALUM & MAG HYDROXIDE-SIMETH 200-200-20 MG/5ML PO SUSP
15.0000 mL | Freq: Four times a day (QID) | ORAL | Status: DC | PRN
  Administered 2023-12-07 – 2024-01-01 (×10): 15 mL via ORAL
  Filled 2023-12-07 (×10): qty 30

## 2023-12-07 MED ORDER — HEPARIN BOLUS VIA INFUSION
1000.0000 [IU] | Freq: Once | INTRAVENOUS | Status: AC
  Administered 2023-12-07: 1000 [IU] via INTRAVENOUS
  Filled 2023-12-07: qty 1000

## 2023-12-07 MED ORDER — SODIUM CHLORIDE 0.9 % IV SOLN
2.0000 g | INTRAVENOUS | Status: DC
  Administered 2023-12-07 – 2023-12-08 (×2): 2 g via INTRAVENOUS
  Filled 2023-12-07 (×2): qty 20

## 2023-12-07 MED ORDER — MINERAL OIL RE ENEM
1.0000 | ENEMA | Freq: Once | RECTAL | Status: AC
  Administered 2023-12-07: 1 via RECTAL

## 2023-12-07 MED ORDER — GABAPENTIN 100 MG PO CAPS
100.0000 mg | ORAL_CAPSULE | Freq: Every day | ORAL | Status: DC
  Administered 2023-12-07 – 2023-12-31 (×25): 100 mg via ORAL
  Filled 2023-12-07 (×25): qty 1

## 2023-12-07 MED ORDER — BISACODYL 10 MG RE SUPP
10.0000 mg | Freq: Every day | RECTAL | Status: DC | PRN
  Administered 2023-12-14: 10 mg via RECTAL
  Filled 2023-12-07 (×2): qty 1

## 2023-12-07 MED ORDER — BISACODYL 10 MG RE SUPP
10.0000 mg | Freq: Once | RECTAL | Status: AC
  Administered 2023-12-07: 10 mg via RECTAL

## 2023-12-07 MED ORDER — MELATONIN 5 MG PO TABS
5.0000 mg | ORAL_TABLET | Freq: Every day | ORAL | Status: AC
Start: 2023-12-07 — End: 2023-12-12
  Administered 2023-12-07 – 2023-12-11 (×5): 5 mg via ORAL
  Filled 2023-12-07 (×5): qty 1

## 2023-12-07 MED ORDER — POLYVINYL ALCOHOL 1.4 % OP SOLN
1.0000 [drp] | OPHTHALMIC | Status: DC | PRN
  Administered 2023-12-07 – 2023-12-08 (×2): 1 [drp] via OPHTHALMIC
  Filled 2023-12-07: qty 15

## 2023-12-07 NOTE — Consult Note (Signed)
 PHARMACY - ANTICOAGULATION CONSULT NOTE  Pharmacy Consult for IV Heparin  Indication: chest pain/ACS  Patient Measurements: Height: 5' 8 (172.7 cm) Weight: 81.6 kg (180 lb) IBW/kg (Calculated) : 68.4 HEPARIN  DW (KG): 81.6  Labs: Recent Labs    12/04/23 2147 12/04/23 2147 12/04/23 2244 12/05/23 0023 12/05/23 0519 12/05/23 0832 12/05/23 1643 12/06/23 0611 12/06/23 2057 12/07/23 0538 12/07/23 1233 12/07/23 1951  HGB 13.3  --   --   --  11.5*  --   --  11.7*  --  11.3*  --   --   HCT 38.3*  --   --   --  34.2*  --   --  34.1*  --  32.7*  --   --   PLT 157  --   --   --  152  --   --  161  --  168  --   --   APTT 55*  --   --   --   --   --   --   --   --   --   --   --   LABPROT 17.0*  --   --   --   --   --   --   --   --   --   --   --   INR 1.3*  --   --   --   --   --   --   --   --   --   --   --   HEPARINUNFRC  --    < >  --   --  <0.10*  --  <0.10* <0.10*   < > 0.27* 0.52 0.42  CREATININE 0.98  --  1.12  --  1.37*   < > 1.35* 1.25*  --  1.04  --   --   TROPONINIHS  --   --  1,880* 7,607*  --   --   --   --   --   --   --   --    < > = values in this interval not displayed.    Estimated Creatinine Clearance: 54.8 mL/min (by C-G formula based on SCr of 1.04 mg/dL).   Medical History: Past Medical History:  Diagnosis Date   Acute ST elevation myocardial infarction (STEMI) of inferior wall (HCC) 04/19/2010   a.) trans[facility] to [facility] --> LHC/PCI (very difficult procedure) --> 3.0 x 23 mm and 3.0 x 12 mm Xience stents to RCA   Allergies    Arthritis    Benign essential hypertension    Bilateral carotid artery disease (HCC) 05/08/2021   a.) carotid doppler 05/08/2021: 1-39% BICA   CAD (coronary artery disease) 04/19/2010   a.) inferior STEMI 04/19/2010 --> LHC/PCI: 50-70% pD1, 80% pRI, 90/90/90% RCA (overlapping 3.0 x 23 and 3.0 x 12 mm Xience DES); b.) MV 11/10/2018: fixed minimally reversible inferior perfusion defect   Cellulitis of foot    Chronic HFrEF  (heart failure with reduced ejection fraction) (HCC)    DDD (degenerative disc disease), cervical    Diabetes mellitus type 2, insulin  dependent (HCC)    Diverticulosis    Full dentures    Gout    Hard of hearing    History of bilateral cataract extraction 2022   History of ETOH abuse    Hyperlipidemia    Ischemic cardiomyopathy 04/19/2010   a.) TTE 04/19/2010: 40%; b.) TTE 04/20/2014: EF >55%; c.) TTE 11/10/2018: EF 45%; d.) TTE  11/14/2022: EF 25-30%; e. 06/2023 Echo: EF 25-30%, nl RV size/fxn. Mild MR. Mild-mod TR. Ao sclerosis w/o stenosis.   Long term current use of aspirin     Long term current use of clopidogrel     Lumbar degenerative disc disease    Lumbar radiculopathy    Lumbar vertebral fracture (chronic superior endplate of L1)    NSTEMI (non-ST elevated myocardial infarction) (HCC) 01/15/2023   OSA (obstructive sleep apnea)    a.) unable to tolerate nocturnal PAP therapy   Peripheral artery disease (HCC)    a.) stenting 05/14/21: 12 mm x 12 cm LifeStent RIGHT dis SFA/prox pop; b.) s/p cath directed thrombolysis RIGHT SFA/pop 06/14/21; c.) s/p mech thrombectomy + stenting 06/15/21: 8 mm x 25cm & 8 mm x 7.5cm Viabahn; d.) s/p BILAT CFA, profunda femoris, SFA endarterectomies + fogarty embolectomy + stenting 11/15/22: 12mm x 58mm Lifestream BILAT CIAs, 14 mm x 6 cm Lifestream & 13 mm x 5 cm Viabahn LEFT EIA   Peripheral neuropathy    Umbilical hernia     Medications:  No anticoagulation prior to admission per my chart review. Takes ASA / Plavix  at home  Assessment: 78 y/o M with medical history as above and including CAD, PAD presenting to the ED 8/14 with right common femoral artery hematoma with active extravasation. Patient underwent endovascular intervention same day. However, clinical picture complicated by rising troponin (715 [phone]). Pharmacy consulted to dose heparin . IV heparin  was stopped this morning for bleeding in groin; vascular surgery now wants to resume  Baseline  Labs aPTT 55s and INR 1.3  Baseline H&H, platelets are within normal limits.  8/17:  HL @ 0538 = 0.27, SUBtherapeutic  8/17 1233 HL 0.52,  therapeutic x1 8/17 1951 HL 0.42, therapeutic x2  Goal of Therapy:  Heparin  level 0.3-0.7 units/ml Monitor platelets by anticoagulation protocol: Yes   Plan:  Continue heparin  infusion at 2050 units/hr. Daily CBC, heparin  level. Monitor for signs/symptoms of bleeding.  Alan Hoe, PharmD 12/07/2023 8:36 PM

## 2023-12-07 NOTE — Progress Notes (Signed)
 PT Cancellation Note  Patient Details Name: DELONTAE LAMM MRN: [id number] DOB: [birth year]   Cancelled Treatment:    Reason Eval/Treat Not Completed: Other (comment). Per general surgery note, pt has increased opening of skin and subcutaneous tissue from prior hematoma and will be going back for closure and I&D. Per secure chat with MD, hold off on therapy until this is closed. Will follow up after procedure and pt is medically stable.    Marcelline Temkin 12/07/2023, 11:10 AM Corean Dade, PT, DPT, GCS [phone]

## 2023-12-07 NOTE — Consult Note (Signed)
 PHARMACY - ANTICOAGULATION CONSULT NOTE  Pharmacy Consult for IV Heparin  Indication: chest pain/ACS  Patient Measurements: Height: 5' 8 (172.7 cm) Weight: 81.6 kg (180 lb) IBW/kg (Calculated) : 68.4 HEPARIN  DW (KG): 81.6  Labs: Recent Labs    12/04/23 1141 12/04/23 1822 12/04/23 2147 12/04/23 2147 12/04/23 2244 12/05/23 0023 12/05/23 0519 12/05/23 0832 12/05/23 1643 12/06/23 0611 12/06/23 2057 12/07/23 0538  HGB 14.3  --  13.3  --   --   --  11.5*  --   --  11.7*  --  11.3*  HCT 42.9  --  38.3*  --   --   --  34.2*  --   --  34.1*  --  32.7*  PLT 191  --  157  --   --   --  152  --   --  161  --  168  APTT  --   --  55*  --   --   --   --   --   --   --   --   --   LABPROT  --   --  17.0*  --   --   --   --   --   --   --   --   --   INR  --   --  1.3*  --   --   --   --   --   --   --   --   --   HEPARINUNFRC  --   --   --    < >  --   --  <0.10*  --  <0.10* <0.10* 0.15* 0.27*  CREATININE 1.12  --  0.98  --  1.12  --  1.37*   < > 1.35* 1.25*  --  1.04  CKTOTAL 115  --   --   --   --   --   --   --   --   --   --   --   TROPONINIHS 715* 6,596*  --   --  8,119* 7,607*  --   --   --   --   --   --    < > = values in this interval not displayed.    Estimated Creatinine Clearance: 54.8 mL/min (by C-G formula based on SCr of 1.04 mg/dL).   Medical History: Past Medical History:  Diagnosis Date   Acute ST elevation myocardial infarction (STEMI) of inferior wall (HCC) 04/19/2010   a.) transfered from [facility] to [facility] --> LHC/PCI (very difficult procedure) --> 3.0 x 23 mm and 3.0 x 12 mm Xience stents to RCA   Allergies    Arthritis    Benign essential hypertension    Bilateral carotid artery disease (HCC) 05/08/2021   a.) carotid doppler 05/08/2021: 1-39% BICA   CAD (coronary artery disease) 04/19/2010   a.) inferior STEMI 04/19/2010 --> LHC/PCI: 50-70% pD1, 80% pRI, 90/90/90% RCA (overlapping 3.0 x 23 and 3.0 x 12 mm Xience DES); b.) MV 11/10/2018: fixed minimally  reversible inferior perfusion defect   Cellulitis of foot    Chronic HFrEF (heart failure with reduced ejection fraction) (HCC)    DDD (degenerative disc disease), cervical    Diabetes mellitus type 2, insulin  dependent (HCC)    Diverticulosis    Full dentures    Gout    Hard of hearing    History of bilateral cataract extraction 2022   History of ETOH abuse  Hyperlipidemia    Ischemic cardiomyopathy 04/19/2010   a.) TTE 04/19/2010: 40%; b.) TTE 04/20/2014: EF >55%; c.) TTE 11/10/2018: EF 45%; d.) TTE 11/14/2022: EF 25-30%; e. 06/2023 Echo: EF 25-30%, nl RV size/fxn. Mild MR. Mild-mod TR. Ao sclerosis w/o stenosis.   Long term current use of aspirin     Long term current use of clopidogrel     Lumbar degenerative disc disease    Lumbar radiculopathy    Lumbar vertebral fracture (chronic superior endplate of L1)    NSTEMI (non-ST elevated myocardial infarction) (HCC) 01/15/2023   OSA (obstructive sleep apnea)    a.) unable to tolerate nocturnal PAP therapy   Peripheral artery disease (HCC)    a.) stenting 05/14/21: 12 mm x 12 cm LifeStent RIGHT dis SFA/prox pop; b.) s/p cath directed thrombolysis RIGHT SFA/pop 06/14/21; c.) s/p mech thrombectomy + stenting 06/15/21: 8 mm x 25cm & 8 mm x 7.5cm Viabahn; d.) s/p BILAT CFA, profunda femoris, SFA endarterectomies + fogarty embolectomy + stenting 11/15/22: 12mm x 58mm Lifestream BILAT CIAs, 14 mm x 6 cm Lifestream & 13 mm x 5 cm Viabahn LEFT EIA   Peripheral neuropathy    Umbilical hernia     Medications:  No anticoagulation prior to admission per my chart review. Takes ASA / Plavix  at home  Assessment: 78 y/o M with medical history as above and including CAD, PAD presenting to the ED 8/14 with right common femoral artery hematoma with active extravasation. Patient underwent endovascular intervention same day. However, clinical picture complicated by rising troponin (715 >> [phone]). Pharmacy consulted to dose heparin . IV heparin  was stopped this  morning for bleeding in groin; vascular surgery now wants to resume  Baseline Labs aPTT 55s and INR 1.3  Baseline H&H, platelets are within normal limits.  Goal of Therapy:  Heparin  level 0.3-0.7 units/ml Monitor platelets by anticoagulation protocol: Yes   Plan:  8/17:  HL @ 0538 = 0.27, SUBtherapeutic  - will order heparin  1000 units IV X 1 bolus and increase drip rate to 2050 units/hr - recheck HL 8 hrs after rate change - CBC daily   Dmarion Perfect D 12/07/2023,6:23 AM

## 2023-12-07 NOTE — Plan of Care (Signed)

## 2023-12-07 NOTE — Consult Note (Signed)
 PHARMACY - ANTICOAGULATION CONSULT NOTE  Pharmacy Consult for IV Heparin  Indication: chest pain/ACS  Patient Measurements: Height: 5' 8 (172.7 cm) Weight: 81.6 kg (180 lb) IBW/kg (Calculated) : 68.4 HEPARIN  DW (KG): 81.6  Labs: Recent Labs    12/04/23 1822 12/04/23 2147 12/04/23 2147 12/04/23 2244 12/05/23 0023 12/05/23 0519 12/05/23 0832 12/05/23 1643 12/06/23 0611 12/06/23 2057 12/07/23 0538 12/07/23 1233  HGB  --  13.3   < >  --   --  11.5*  --   --  11.7*  --  11.3*  --   HCT  --  38.3*   < >  --   --  34.2*  --   --  34.1*  --  32.7*  --   PLT  --  157   < >  --   --  152  --   --  161  --  168  --   APTT  --  55*  --   --   --   --   --   --   --   --   --   --   LABPROT  --  17.0*  --   --   --   --   --   --   --   --   --   --   INR  --  1.3*  --   --   --   --   --   --   --   --   --   --   HEPARINUNFRC  --   --    < >  --   --  <0.10*  --  <0.10* <0.10* 0.15* 0.27* 0.52  CREATININE  --  0.98   < > 1.12  --  1.37*   < > 1.35* 1.25*  --  1.04  --   TROPONINIHS 3,403*  --   --  1,880* 7,607*  --   --   --   --   --   --   --    < > = values in this interval not displayed.    Estimated Creatinine Clearance: 54.8 mL/min (by C-G formula based on SCr of 1.04 mg/dL).   Medical History: Past Medical History:  Diagnosis Date   Acute ST elevation myocardial infarction (STEMI) of inferior wall (HCC) 04/19/2010   a.) transfered from [facility] to [facility] --> LHC/PCI (very difficult procedure) --> 3.0 x 23 mm and 3.0 x 12 mm Xience stents to RCA   Allergies    Arthritis    Benign essential hypertension    Bilateral carotid artery disease (HCC) 05/08/2021   a.) carotid doppler 05/08/2021: 1-39% BICA   CAD (coronary artery disease) 04/19/2010   a.) inferior STEMI 04/19/2010 --> LHC/PCI: 50-70% pD1, 80% pRI, 90/90/90% RCA (overlapping 3.0 x 23 and 3.0 x 12 mm Xience DES); b.) MV 11/10/2018: fixed minimally reversible inferior perfusion defect   Cellulitis of foot    Chronic  HFrEF (heart failure with reduced ejection fraction) (HCC)    DDD (degenerative disc disease), cervical    Diabetes mellitus type 2, insulin  dependent (HCC)    Diverticulosis    Full dentures    Gout    Hard of hearing    History of bilateral cataract extraction 2022   History of ETOH abuse    Hyperlipidemia    Ischemic cardiomyopathy 04/19/2010   a.) TTE 04/19/2010: 40%; b.) TTE 04/20/2014: EF >55%; c.) TTE 11/10/2018: EF 45%; d.)  TTE 11/14/2022: EF 25-30%; e. 06/2023 Echo: EF 25-30%, nl RV size/fxn. Mild MR. Mild-mod TR. Ao sclerosis w/o stenosis.   Long term current use of aspirin     Long term current use of clopidogrel     Lumbar degenerative disc disease    Lumbar radiculopathy    Lumbar vertebral fracture (chronic superior endplate of L1)    NSTEMI (non-ST elevated myocardial infarction) (HCC) 01/15/2023   OSA (obstructive sleep apnea)    a.) unable to tolerate nocturnal PAP therapy   Peripheral artery disease (HCC)    a.) stenting 05/14/21: 12 mm x 12 cm LifeStent RIGHT dis SFA/prox pop; b.) s/p cath directed thrombolysis RIGHT SFA/pop 06/14/21; c.) s/p mech thrombectomy + stenting 06/15/21: 8 mm x 25cm & 8 mm x 7.5cm Viabahn; d.) s/p BILAT CFA, profunda femoris, SFA endarterectomies + fogarty embolectomy + stenting 11/15/22: 12mm x 58mm Lifestream BILAT CIAs, 14 mm x 6 cm Lifestream & 13 mm x 5 cm Viabahn LEFT EIA   Peripheral neuropathy    Umbilical hernia     Medications:  No anticoagulation prior to admission per my chart review. Takes ASA / Plavix  at home  Assessment: 78 y/o M with medical history as above and including CAD, PAD presenting to the ED 8/14 with right common femoral artery hematoma with active extravasation. Patient underwent endovascular intervention same day. However, clinical picture complicated by rising troponin ([phone]). Pharmacy consulted to dose heparin . IV heparin  was stopped this morning for bleeding in groin; vascular surgery now wants to  resume  Baseline Labs aPTT 55s and INR 1.3  Baseline H&H, platelets are within normal limits.  8/17:  HL @ 0538 = 0.27, SUBtherapeutic  8/17 1233 HL 0.52,  therapeutic x1  Goal of Therapy:  Heparin  level 0.3-0.7 units/ml Monitor platelets by anticoagulation protocol: Yes   Plan:  8/17 1233 HL 0.52,  therapeutic x1 - will continue heparin  drip rate at 2050 units/hr - check confirmatory HL 8 hrs  - CBC daily   Allean Haas PharmD Clinical Pharmacist 12/07/2023

## 2023-12-07 NOTE — Progress Notes (Signed)
 3 Days Post-Op   Subjective/Chief Complaint: Patient with continued mild oozing from right groin skin tear. hgB stable. Chief complaint is not being able to sleep. Denies right groin or leg pain   Objective: Vital signs in last 24 hours: Temp:  [97.9 F (36.6 C)-98.8 F (37.1 C)] 98.8 F (37.1 C) (08/17 0812) Pulse Rate:  [76-137] 84 (08/17 0812) Resp:  [10-28] 18 (08/17 0812) BP: (119-160)/(78-96) 160/96 (08/17 0812) SpO2:  [89 %-100 %] 100 % (08/17 0812) Last BM Date : 12/03/23  Intake/Output from previous day: 08/16 0701 - 08/17 0700 In: 1947.6 [I.V.:1747.6; IV Piggyback:200] Out: 1175 [Urine:1175] Intake/Output this shift: No intake/output data recorded.  General appearance: alert and no distress Extremities: RIGHT groin- increased separation/opening of skin- now subcutaneous. With hematoma and mild serosang drainage. Firm however, smaller than prior exam; ecchymosis  Lab Results:  Recent Labs    12/06/23 0611 12/07/23 0538  WBC 10.3 8.2  HGB 11.7* 11.3*  HCT 34.1* 32.7*  PLT 161 168   BMET Recent Labs    12/06/23 0611 12/07/23 0538  NA 129* 133*  K 4.0 4.1  CL 99 101  CO2 21* 21*  GLUCOSE 149* 138*  BUN 30* 29*  CREATININE 1.25* 1.04  CALCIUM  7.9* 8.3*   PT/INR Recent Labs    12/04/23 2147  LABPROT 17.0*  INR 1.3*   ABG Recent Labs    12/05/23 0023  HCO3 22.9    Studies/Results: ECHOCARDIOGRAM COMPLETE Result Date: 12/06/2023    ECHOCARDIOGRAM REPORT   Patient Name:   Manuel [name] Date of Exam: 12/05/2023 Medical Rec #:  [id number]        Height:       68.0 in Accession #:    [id number]       Weight:       180.0 lb Date of Birth:  [birth year]        BSA:          1.954 m Patient Age:    78 years         BP:           81/51 mmHg Patient Gender: M                HR:           80 bpm. Exam Location:  [facility] Procedure: 2D Echo, Cardiac Doppler and Color Doppler (Both Spectral and Color            Flow Doppler were utilized during procedure).  Indications:     Elevated Troponin  History:         Patient has prior history of Echocardiogram examinations, most                  recent 07/11/2023. CHF, Previous Myocardial Infarction and CAD,                  PAD; Risk Factors:Diabetes and Dyslipidemia.  Sonographer:     Thea Norlander RCS Referring Phys:  [id number] BRITTON L [place] Diagnosing Phys: Redell Cave MD IMPRESSIONS  1. Left ventricular ejection fraction, by estimation, is 20 to 25%. The left ventricle has severely decreased function. The left ventricle demonstrates global hypokinesis. Left ventricular diastolic parameters are consistent with Grade I diastolic dysfunction (impaired relaxation).  2. Right ventricular systolic function is severely reduced. The right ventricular size is normal. There is normal pulmonary artery systolic pressure.  3. Left atrial size was mildly dilated.  4. The mitral valve is normal  in structure. Mild mitral valve regurgitation.  5. The aortic valve is tricuspid. Aortic valve regurgitation is not visualized. Aortic valve sclerosis/calcification is present, without any evidence of aortic stenosis.  6. Aortic dilatation noted. There is mild dilatation of the ascending aorta, measuring 39 mm.  7. The inferior vena cava is dilated in size with <50% respiratory variability, suggesting right atrial pressure of 15 mmHg. FINDINGS  Left Ventricle: Left ventricular ejection fraction, by estimation, is 20 to 25%. The left ventricle has severely decreased function. The left ventricle demonstrates global hypokinesis. The left ventricular internal cavity size was normal in size. There is no left ventricular hypertrophy. Left ventricular diastolic parameters are consistent with Grade I diastolic dysfunction (impaired relaxation). Right Ventricle: The right ventricular size is normal. No increase in right ventricular wall thickness. Right ventricular systolic function is severely reduced. There is normal pulmonary artery  systolic pressure. The tricuspid regurgitant velocity is 2.14 m/s, and with an assumed right atrial pressure of 15 mmHg, the estimated right ventricular systolic pressure is 33.3 mmHg. Left Atrium: Left atrial size was mildly dilated. Right Atrium: Right atrial size was normal in size. Pericardium: There is no evidence of pericardial effusion. Mitral Valve: The mitral valve is normal in structure. Mild mitral valve regurgitation. Tricuspid Valve: The tricuspid valve is normal in structure. Tricuspid valve regurgitation is not demonstrated. Aortic Valve: The aortic valve is tricuspid. Aortic valve regurgitation is not visualized. Aortic valve sclerosis/calcification is present, without any evidence of aortic stenosis. Aortic valve peak gradient measures 2.8 mmHg. Pulmonic Valve: The pulmonic valve was not well visualized. Pulmonic valve regurgitation is not visualized. Aorta: Aortic dilatation noted and the aortic root is normal in size and structure. There is mild dilatation of the ascending aorta, measuring 39 mm. Venous: The inferior vena cava is dilated in size with less than 50% respiratory variability, suggesting right atrial pressure of 15 mmHg. IAS/Shunts: No atrial level shunt detected by color flow Doppler.  LEFT VENTRICLE PLAX 2D LVIDd:         5.10 cm   Diastology LVIDs:         4.60 cm   LV e' medial:    3.32 cm/s LV PW:         0.90 cm   LV E/e' medial:  17.0 LV IVS:        1.00 cm   LV e' lateral:   7.14 cm/s LVOT diam:     2.10 cm   LV E/e' lateral: 7.9 LV SV:         36 LV SV Index:   19 LVOT Area:     3.46 cm  RIGHT VENTRICLE            IVC RV S prime:     4.22 cm/s  IVC diam: 2.40 cm LEFT ATRIUM             Index        RIGHT ATRIUM           Index LA diam:        4.50 cm 2.30 cm/m   RA Area:     17.30 cm LA Vol (A2C):   40.3 ml 20.62 ml/m  RA Volume:   46.50 ml  23.79 ml/m LA Vol (A4C):   66.4 ml 33.98 ml/m LA Biplane Vol: 55.0 ml 28.14 ml/m  AORTIC VALVE AV Area (Vmax): 2.68 cm AV Vmax:         83.40 cm/s AV Peak Grad:  2.8 mmHg LVOT Vmax:      64.50 cm/s LVOT Vmean:     40.400 cm/s LVOT VTI:       0.105 m  AORTA Ao Root diam: 3.50 cm Ao Asc diam:  3.90 cm MITRAL VALVE               TRICUSPID VALVE MV Area (PHT): 4.68 cm    TR Peak grad:   18.3 mmHg MV Decel Time: 162 msec    TR Vmax:        214.00 cm/s MR Peak grad: 25.4 mmHg MR Vmax:      252.00 cm/s  SHUNTS MV E velocity: 56.60 cm/s  Systemic VTI:  0.10 m MV A velocity: 74.90 cm/s  Systemic Diam: 2.10 cm MV E/A ratio:  0.76 Redell Cave MD Electronically signed by Redell Cave MD Signature Date/Time: 12/06/2023/3:24:18 PM    Final    US  RENAL Result Date: 12/05/2023 CLINICAL DATA:  Acute kidney injury. EXAM: RENAL / URINARY TRACT ULTRASOUND COMPLETE COMPARISON:  None Available. FINDINGS: Right Kidney: Renal measurements: 12.3 cm x 5.6 cm x 6.6 cm = volume: 237.0 mL. Echogenicity within normal limits. No mass or hydronephrosis visualized. Left Kidney: Renal measurements: 11.7 cm x 5.8 cm x 5.4 cm = volume: 192.8 mL. Echogenicity within normal limits. No mass or hydronephrosis visualized. Bladder: Appears normal for degree of bladder distention. Other: None. IMPRESSION: Unremarkable renal ultrasound. Electronically Signed   By: Suzen Dials M.D.   On: 12/05/2023 20:17    Anti-infectives: Anti-infectives (From admission, onward)    Start     Dose/Rate Route Frequency Ordered Stop   12/06/23 0300  vancomycin  (VANCOREADY) IVPB 1250 mg/250 mL  Status:  Discontinued        1,250 mg 166.7 mL/hr over 90 Minutes Intravenous Every 24 hours 12/05/23 0103 12/05/23 0713   12/05/23 0145  ceFEPIme  (MAXIPIME ) 2 g in sodium chloride  0.9 % 100 mL IVPB        2 g 200 mL/hr over 30 Minutes Intravenous Every 12 hours 12/05/23 0049     12/05/23 0145  vancomycin  (VANCOREADY) IVPB 2000 mg/400 mL        2,000 mg 200 mL/hr over 120 Minutes Intravenous  Once 12/05/23 0049 12/05/23 0400   12/05/23 0100  azithromycin  (ZITHROMAX ) 500 mg in  sodium chloride  0.9 % 250 mL IVPB  Status:  Discontinued        500 mg 250 mL/hr over 60 Minutes Intravenous Every 24 hours 12/05/23 0003 12/05/23 0713   12/05/23 0000  ceFAZolin  (ANCEF ) IVPB 1 g/50 mL premix  Status:  Discontinued       Note to Pharmacy: Send with pt to OR   1 g 100 mL/hr over 30 Minutes Intravenous On call 12/04/23 1814 12/04/23 1922   12/04/23 2030  ceFAZolin  (ANCEF ) IVPB 2g/100 mL premix  Status:  Discontinued        2 g 200 mL/hr over 30 Minutes Intravenous  Once 12/04/23 2025 12/04/23 2122   12/04/23 1911  ceFAZolin  (ANCEF ) IVPB 1 g/50 mL premix        over 30 Minutes  Continuous PRN 12/04/23 1923 12/04/23 2032       Assessment/Plan: s/p Procedure(s): Lower Extremity Angiography (Right) POD #3 S/P RIGHT EIA/CFA stenting; PTA of PFA Increased opening of skin and subcutaneous tissue from prior hematoma. Patient eating breakfast upon my exam. Will need operative hematoma evacuation/I&D and closure over the next day or so. Keep dry dressing over right groin No evidence  of acute bleeding- continue anticoagulation.  LOS: 3 days    [name] 12/07/2023

## 2023-12-07 NOTE — Progress Notes (Signed)
 [facility] Progress Note  Patient: Manuel [name]    FMW:[id number]  DOA: 12/04/2023     Date of Service: the patient was seen and examined on 12/07/2023  Chief Complaint  Patient presents with   Weakness   Brief hospital course: Manuel [name] is a 78 y.o. 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 Patient fell with walker landing on top of him.  EMS was called for assistance.  Patient refused to be transported to ED for evaluation initial. However as time when on patient note more intense pain in right groin area and due to his EMS was called back and patient was transported to ED.      ED Course:  99.3/tmx 100.7 BP 131/72, hr 113, rr 19 sat 95%  UA :rare bacteria , ketones 20 ,  Wbc 11.6, hgb 14.3, plt 191, increase pmn Na 126 ( 137), K 5.3, cl91, glu 438, cr 1.12 Lactic 2.9 ,3.3 Trop 284,3403, 8119 EKG: sinus tachycardia , RAD twave change inferior lateral leads  Patient on evaluation found to have right femoral a rupture, vascular consulted and performed repair.     Patient course was further complicated by NTSEMI , s/p right femoral a repair patient was started on heparin  drip . Cardiology was consulted and will see patient in am , but plans for intervention  recommend medical management.   PCCM was involved for comanagement. [facility] was consulted for admission and further management as below.   Assessment and Plan:  # Right femoral artery Rupture s/p repair S/p right EIA/CFA stenting, PTA of PFA -follow vascular recs  8/17 Increased opening of skin and subcutaneous tissue from prior hematoma.    Patient Will need operative hematoma evacuation/I&D and  closure over the next day or so.  Follow-up with vascular surgery for further recommendation and DC planning  # NSTEMI -CAD s/p pic RCA c/b NSTEMI 06/2023 s/p stent x 3 to LAD Continue aspirin  and statin 8/15 started Plavix  continue heparin  drip  -TTE LVEF 20 to 25%, severely decreased function and global hypokinesis. Grade 1 DD.  RV systolic function is severely reduced. -Troponin peaked 8119 >> 7607 - Cardiology following  # Biventricular systolic failure and diastolic dysfunction  TTE as above -Appears euvolemic -No GDMT due to low normal BP. -Restart GDMT when BP permits Follow cardiology   # CAP with Sepsis, Serratia bacteremia -  Opacities on chest imaging /fever/mild leukocytosis/lactic acidosis -s/p Vanco azithromycin  and cefepime  MRSA PCR negative Discontinued vancomycin  azithromycin , continue cefepime  for now Blood culture growing Serratia MRSA sensitive, sensitive reports pending     # DMII uncontrolled with hyperglycemia - AG13>>10 improved  -fs 394 >>93 improved Started Semglee  14 units nightly, NovoLog  sliding scale Monitor CBG, continue diabetic diet    # Isotonic hyponatremia, most likely nutritional deficiency Serum osmolality 280 WNL -gently ivfs  Monitor BMP Na 129 >>133    # CHF combined systolic diastolic  - no acute exacerbation , appear hypovolemic  - holding diuretic currently  -resume cardiac GDMT in am as able as bp and volume status allows    # PAD -s/p PTA -resume asa/plavix     #  HTN  8/15 low blood pressure.  DC'd Coreg  Started midodrine  10 mg p.o. 3 times daily with holding parameters 8/16 BP improved, decrease midodrine  5 mg p.o. 3 times daily with holding parameters Monitor BP and titrate medication accordingly   # AKI due to hypotension versus contrast. Resolved Continue IV fluid for hydration Started Mucomyst  600 mg p.o. twice daily for 2 days Monitor renal functions and urine output Check bladder scan Avoid nephrotoxic  medications and use renally dose medications sCr 1.25>>1.04 and Co2 21  # Metabolic acidosis due to AKI 8/15  bicarbonate 100 mEq x 1 dose given  # Hyperkalemia secondary to AKI. Resolved  Lokelma  10 g x 2 doses given Monitor BMP daily   # OSA -02 at bedtime    # DJD of the spine -supportive care    # Hx of ETOH abuse -placed on ciwa   # Constipation: started laxatives 8/17 Dulcolax suppository x 1 dose given  Body mass index is 27.37 kg/m.  Interventions:  Diet: Carb modified diet, fluid striction 1.5 L/day DVT Prophylaxis: Heparin  IV infusion   Advance goals of care discussion: Full code  Family Communication: family was present at bedside, at the time of interview.  The pt provided permission to discuss medical plan with the family. Opportunity was given to ask question and all questions were answered satisfactorily.   Disposition:  Pt is from Home, admitted with sepsis, pneumonia, right femoral hematoma, non-STEMI, still very sick, which precludes a safe discharge. Discharge to home versus SNF, TBD, when stable, may need few days to improve.  Subjective: No significant events overnight.  Complaining of intermittent pain in the left upper quadrant due to gas, had no BM for past 5 days.  Agreed for Dulcolax suppository. Denied any chest pain or palpitation, no shortness of breath, no any other complaints.    Physical Exam: General: NAD, lying comfortably Appear in no distress, affect appropriate Eyes: PERRLA ENT: Oral Mucosa Clear, moist  Neck: no JVD,  Cardiovascular: S1 and S2 Present, no Murmur,  Respiratory: good respiratory effort, Bilateral Air entry equal and Decreased, no Crackles, no wheezes Abdomen: Bowel Sound present, Soft and no tenderness,  Skin: no rashes Extremities: s/p right femoral repair, dressing soaked with dried blood, no active bleeding noticed.   S/p right BKA. LLE mild edema and pressure ulcers, no calf tenderness Neurologic: without  any new focal findings Gait not checked due to patient safety concerns  Vitals:   12/06/23 1848 12/06/23 1928 12/07/23 0341 12/07/23 0812  BP: (!) 147/79 134/81 119/78 (!) 160/96  Pulse: 84 85 78 84  Resp: 18 16 16 18   Temp: 97.9 F (36.6 C) 98.3 F (36.8 C) 98.3 F (36.8 C) 98.8 F (37.1 C)  TempSrc:      SpO2: 91% 98% 100% 100%  Weight:      Height:        Intake/Output Summary (Last 24 hours) at 12/07/2023 1612 Last data filed at 12/07/2023 1512 Gross per 24 hour  Intake 3215.12 ml  Output 1050 ml  Net 2165.12 ml   Filed Weights   12/04/23 1138  Weight: 81.6 kg    Data Reviewed: I have personally reviewed and interpreted daily labs, tele strips, imagings as discussed above. I reviewed all nursing notes, pharmacy notes, vitals, pertinent old records I have discussed plan of care as described above with RN and patient/family.  CBC: Recent Labs  Lab 12/04/23 1141 12/04/23 2147 12/05/23 0519 12/06/23 0611 12/07/23 0538  WBC 11.6*  11.0* 14.5* 10.3 8.2  NEUTROABS 10.5*  --   --   --   --   HGB 14.3 13.3 11.5* 11.7* 11.3*  HCT 42.9 38.3* 34.2* 34.1* 32.7*  MCV 84.6 82.2 85.5 83.8 83.0  PLT 191 157 152 161 168   Basic Metabolic Panel: Recent Labs  Lab 12/05/23 0832 12/05/23 1235 12/05/23 1643 12/06/23 0611 12/07/23 0538  NA 131* 130* 130* 129* 133*  K 5.4* 4.5 4.6 4.0 4.1  CL 98 98 101 99 101  CO2 20* 21* 19* 21* 21*  GLUCOSE 234* 143* 87 149* 138*  BUN 26* 31* 30* 30* 29*  CREATININE 1.44* 1.45* 1.35* 1.25* 1.04  CALCIUM  8.3* 8.0* 8.3* 7.9* 8.3*  MG  --   --   --  1.9 2.0  PHOS  --   --   --  2.8 2.7    Studies: No results found.   Scheduled Meds:  aspirin  EC  81 mg Oral Daily   atorvastatin   80 mg Oral QHS   bisacodyl   10 mg Oral QHS   Chlorhexidine  Gluconate Cloth  6 each Topical Daily   clopidogrel   75 mg Oral Daily   folic acid   1 mg Oral Daily   insulin  aspart  0-15 Units Subcutaneous Q4H   insulin  glargine-yfgn  14 Units Subcutaneous  Q2200   multivitamin with minerals  1 tablet Oral Daily   polyethylene glycol  17 g Oral BID   sodium chloride  flush  3 mL Intravenous Q12H   thiamine   100 mg Oral Daily   Or   thiamine   100 mg Intravenous Daily   Continuous Infusions:  cefTRIAXone  (ROCEPHIN )  IV     heparin  2,050 Units/hr (12/07/23 1512)   PRN Meds: acetaminophen , albuterol , alum & mag hydroxide-simeth, artificial tears, bisacodyl , dextrose , LORazepam  **OR** LORazepam , morphine  injection, ondansetron  (ZOFRAN ) IV, oxyCODONE , sodium chloride  flush, traZODone   Time spent: 40 minutes  Author: ELVAN SOR. MD [facility] 12/07/2023 4:12 PM  To reach On-call, see care teams to locate the attending and reach out to them via [email or web address]. If 7PM-7AM, please contact night-coverage If you still have difficulty reaching the attending provider, please page the [facility] (Director on Call) for [facility] on amion for assistance.

## 2023-12-07 NOTE — Progress Notes (Signed)
 OT Cancellation Note  Patient Details Name: Manuel [name] MRN: [id number] DOB: 1946-01-24   Cancelled Treatment:    Reason Eval/Treat Not Completed: Medical issues which prohibited therapy. Per general surgery note, pt has increased opening of skin and subcutaneous tissue from prior hematoma and will be going back for closure and I&D. Per secure chat with MD, hold off on therapy until this is closed. Will follow up after procedure and pt is medically stable. Kylena Mole E Elese Rane 12/07/2023, 10:52 AM

## 2023-12-08 ENCOUNTER — Telehealth: Payer: Self-pay | Admitting: Family Medicine

## 2023-12-08 DIAGNOSIS — I772 Rupture of artery: Secondary | ICD-10-CM | POA: Diagnosis not present

## 2023-12-08 DIAGNOSIS — S301XXA Contusion of abdominal wall, initial encounter: Secondary | ICD-10-CM

## 2023-12-08 LAB — GLUCOSE, CAPILLARY
Glucose-Capillary: 90 mg/dL (ref 70–99)
Glucose-Capillary: 84 mg/dL (ref 70–99)
Glucose-Capillary: 104 mg/dL — ABNORMAL HIGH (ref 70–99)
Glucose-Capillary: 111 mg/dL — ABNORMAL HIGH (ref 70–99)
Glucose-Capillary: 133 mg/dL — ABNORMAL HIGH (ref 70–99)
Glucose-Capillary: 119 mg/dL — ABNORMAL HIGH (ref 70–99)

## 2023-12-08 LAB — CBC
HCT: 35.1 % — ABNORMAL LOW (ref 39.0–52.0)
Hemoglobin: 11.7 g/dL — ABNORMAL LOW (ref 13.0–17.0)
MCH: 28.7 pg (ref 26.0–34.0)
MCHC: 33.3 g/dL (ref 30.0–36.0)
MCV: 86 fL (ref 80.0–100.0)
Platelets: 213 K/uL (ref 150–400)
RBC: 4.08 MIL/uL — ABNORMAL LOW (ref 4.22–5.81)
RDW: 15.9 % — ABNORMAL HIGH (ref 11.5–15.5)
WBC: 7.4 K/uL (ref 4.0–10.5)
nRBC: 0 % (ref 0.0–0.2)

## 2023-12-08 LAB — BASIC METABOLIC PANEL WITH GFR
Anion gap: 8 (ref 5–15)
BUN: 27 mg/dL — ABNORMAL HIGH (ref 8–23)
CO2: 25 mmol/L (ref 22–32)
Calcium: 8.6 mg/dL — ABNORMAL LOW (ref 8.9–10.3)
Chloride: 101 mmol/L (ref 98–111)
Creatinine, Ser: 1.02 mg/dL (ref 0.61–1.24)
GFR, Estimated: >60 mL/min (ref >60)
Glucose, Bld: 82 mg/dL (ref 70–99)
Potassium: 4 mmol/L (ref 3.5–5.1)
Sodium: 134 mmol/L — ABNORMAL LOW (ref 135–145)

## 2023-12-08 LAB — HEPATIC FUNCTION PANEL
ALT: 22 U/L (ref 0–44)
AST: 25 U/L (ref 15–41)
Albumin: 3 g/dL — ABNORMAL LOW (ref 3.5–5.0)
Alkaline Phosphatase: 76 U/L (ref 38–126)
Bilirubin, Direct: 0.2 mg/dL (ref 0.0–0.2)
Indirect Bilirubin: 0.5 mg/dL (ref 0.3–0.9)
Total Bilirubin: 0.7 mg/dL (ref 0.0–1.2)
Total Protein: 6.2 g/dL — ABNORMAL LOW (ref 6.5–8.1)

## 2023-12-08 LAB — PHOSPHORUS: Phosphorus: 2.3 mg/dL — ABNORMAL LOW (ref 2.5–4.6)

## 2023-12-08 LAB — LEGIONELLA PNEUMOPHILA SEROGP 1 UR AG: L. pneumophila Serogp 1 Ur Ag: NEGATIVE

## 2023-12-08 LAB — MAGNESIUM: Magnesium: 2.6 mg/dL — ABNORMAL HIGH (ref 1.7–2.4)

## 2023-12-08 LAB — HEPARIN LEVEL (UNFRACTIONATED): Heparin Unfractionated: 0.5 [IU]/mL (ref 0.30–0.70)

## 2023-12-08 MED ORDER — FLUTICASONE PROPIONATE 50 MCG/ACT NA SUSP
1.0000 | Freq: Two times a day (BID) | NASAL | Status: DC | PRN
  Administered 2023-12-09 – 2023-12-15 (×6): 1 via NASAL
  Filled 2023-12-08 (×4): qty 16

## 2023-12-08 MED ORDER — POLYMYXIN B-TRIMETHOPRIM 10000-0.1 UNIT/ML-% OP SOLN
1.0000 [drp] | Freq: Four times a day (QID) | OPHTHALMIC | Status: AC
  Administered 2023-12-08 – 2023-12-10 (×7): 1 [drp] via OPHTHALMIC
  Filled 2023-12-08: qty 10

## 2023-12-08 NOTE — Plan of Care (Signed)

## 2023-12-08 NOTE — Consult Note (Signed)
 PHARMACY - ANTICOAGULATION CONSULT NOTE  Pharmacy Consult for IV Heparin  Indication: chest pain/ACS  Patient Measurements: Height: 5' 8 (172.7 cm) Weight: 81.6 kg (180 lb) IBW/kg (Calculated) : 68.4 HEPARIN  DW (KG): 81.6  Labs: Recent Labs    12/06/23 0611 12/06/23 2057 12/07/23 0538 12/07/23 1233 12/07/23 1951 12/08/23 0553  HGB 11.7*  --  11.3*  --   --  11.7*  HCT 34.1*  --  32.7*  --   --  35.1*  PLT 161  --  168  --   --  213  HEPARINUNFRC <0.10*   < > 0.27* 0.52 0.42 0.50  CREATININE 1.25*  --  1.04  --   --  1.02   < > = values in this interval not displayed.    Estimated Creatinine Clearance: 55.9 mL/min (by C-G formula based on SCr of 1.02 mg/dL).   Medical History: Past Medical History:  Diagnosis Date   Acute ST elevation myocardial infarction (STEMI) of inferior wall (HCC) 04/19/2010   a.) trans[facility] to [facility] --> LHC/PCI (very difficult procedure) --> 3.0 x 23 mm and 3.0 x 12 mm Xience stents to RCA   Allergies    Arthritis    Benign essential hypertension    Bilateral carotid artery disease (HCC) 05/08/2021   a.) carotid doppler 05/08/2021: 1-39% BICA   CAD (coronary artery disease) 04/19/2010   a.) inferior STEMI 04/19/2010 --> LHC/PCI: 50-70% pD1, 80% pRI, 90/90/90% RCA (overlapping 3.0 x 23 and 3.0 x 12 mm Xience DES); b.) MV 11/10/2018: fixed minimally reversible inferior perfusion defect   Cellulitis of foot    Chronic HFrEF (heart failure with reduced ejection fraction) (HCC)    DDD (degenerative disc disease), cervical    Diabetes mellitus type 2, insulin  dependent (HCC)    Diverticulosis    Full dentures    Gout    Hard of hearing    History of bilateral cataract extraction 2022   History of ETOH abuse    Hyperlipidemia    Ischemic cardiomyopathy 04/19/2010   a.) TTE 04/19/2010: 40%; b.) TTE 04/20/2014: EF >55%; c.) TTE 11/10/2018: EF 45%; d.) TTE 11/14/2022: EF 25-30%; e. 06/2023 Echo: EF 25-30%, nl RV size/fxn. Mild MR. Mild-mod  TR. Ao sclerosis w/o stenosis.   Long term current use of aspirin     Long term current use of clopidogrel     Lumbar degenerative disc disease    Lumbar radiculopathy    Lumbar vertebral fracture (chronic superior endplate of L1)    NSTEMI (non-ST elevated myocardial infarction) (HCC) 01/15/2023   OSA (obstructive sleep apnea)    a.) unable to tolerate nocturnal PAP therapy   Peripheral artery disease (HCC)    a.) stenting 05/14/21: 12 mm x 12 cm LifeStent RIGHT dis SFA/prox pop; b.) s/p cath directed thrombolysis RIGHT SFA/pop 06/14/21; c.) s/p mech thrombectomy + stenting 06/15/21: 8 mm x 25cm & 8 mm x 7.5cm Viabahn; d.) s/p BILAT CFA, profunda femoris, SFA endarterectomies + fogarty embolectomy + stenting 11/15/22: 12mm x 58mm Lifestream BILAT CIAs, 14 mm x 6 cm Lifestream & 13 mm x 5 cm Viabahn LEFT EIA   Peripheral neuropathy    Umbilical hernia     Medications:  No anticoagulation prior to admission per my chart review. Takes ASA / Plavix  at home  Assessment: 78 y/o M with medical history as above and including CAD, PAD presenting to the ED 8/14 with right common femoral artery hematoma with active extravasation. Patient underwent endovascular intervention same day. However,  clinical picture complicated by rising troponin (715 >> 6596). Pharmacy consulted to dose heparin . IV heparin  was stopped this morning for bleeding in groin; vascular surgery now wants to resume  Baseline Labs aPTT 55s and INR 1.3  Baseline H&H, platelets are within normal limits.  8/17:  HL @ 0538 = 0.27, SUBtherapeutic  8/17 1233 HL 0.52,  therapeutic x1 8/17 1951 HL 0.42, therapeutic x2 8/18 0553 HL 0.50, therapeutic X 3   Goal of Therapy:  Heparin  level 0.3-0.7 units/ml Monitor platelets by anticoagulation protocol: Yes   Plan:  Continue heparin  infusion at 2050 units/hr. Daily CBC, heparin  level. Monitor for signs/symptoms of bleeding.  Buren Havey D 12/08/2023 7:08 AM

## 2023-12-08 NOTE — Evaluation (Signed)
 Occupational Therapy Evaluation Patient Details Name: Manuel [name] MRN: [id number] DOB: 1945-07-11 Today's Date: 12/08/2023   History of Present Illness   78 y.o. 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 Patient fell with walker landing on top of him.  EMS was called for assistance.     Clinical Impressions Patient presenting with decreased Ind in self care,balance, functional mobility/transfers, endurance, and safety awareness. Patient reports being Mod I at baseline with wheelchair transfers and wife assists with self care tasks as needed. Pt does don LE prosthesis and transfers/ambulates with crutches. Pt demonstrates ability to laterally scoot along EOB towards the L with close supervision. Pt does fatigue quickly but is cooperative overall and pleasant. Pt is motivated for therapeutic intervention. Patient will benefit from acute OT to increase overall independence in the areas of ADLs, functional mobility, and safety awareness in order to safely discharge.     If plan is discharge home, recommend the following:   A little help with walking and/or transfers;A little help with bathing/dressing/bathroom;Assistance with cooking/housework;Direct supervision/assist for medications management;Direct supervision/assist for financial management;Assist for transportation     Functional Status Assessment   Patient has had a recent decline in their functional status and demonstrates the ability to make significant improvements in function in a reasonable and predictable amount of time.     Equipment Recommendations   None recommended by OT       Precautions/Restrictions   Precautions Precautions: Fall     Mobility Bed Mobility Overal bed mobility: Needs Assistance Bed Mobility: Supine to Sit     Supine to sit: Supervision          Transfers Overall transfer level: Needs assistance Equipment used: None               General transfer comment: supervision for lateral scoots to the L along EOB      Balance Overall balance assessment: Needs assistance Sitting-balance support: Feet supported Sitting balance-Leahy Scale: Good                                     ADL either performed or assessed with clinical judgement   ADL Overall ADL's : Needs assistance/impaired Eating/Feeding: Modified independent;Sitting   Grooming: Set up;Sitting                                       Vision Baseline Vision/History: 1 Wears glasses Patient Visual Report: No change from baseline              Pertinent Vitals/Pain Pain Assessment Pain Assessment: 0-10 Pain Score: 0-No pain     Extremity/Trunk Assessment Upper Extremity Assessment Upper Extremity Assessment: Overall WFL for tasks assessed;Generalized weakness   Lower Extremity Assessment Lower Extremity Assessment: Defer to PT evaluation;RLE deficits/detail RLE Deficits / Details: R BKA       Communication Communication Communication: Impaired Factors Affecting Communication: Hearing impaired   Cognition Arousal: Alert Behavior During Therapy: WFL for tasks assessed/performed Cognition: No apparent  impairments                               Following commands: Intact       Cueing  General Comments   Cueing Techniques: Verbal cues              Home Living Family/patient expects to be discharged to:: Private residence Living Arrangements: Spouse/significant other Available Help at Discharge: Family;Available 24 hours/day Type of Home: House Home Access: Ramped entrance     Home  Layout: Able to live on main level with bedroom/bathroom;Two level;Laundry or work area in basement Alternate Teacher, music of Steps: flight Alternate Level Stairs-Rails: Right;Left Bathroom Shower/Tub: Chief Strategy Officer: Handicapped height     Home Equipment: Agricultural consultant (2 wheels);BSC/3in1;Wheelchair - manual;Cane - single point;Tub bench          Prior Functioning/Environment Prior Level of Function : Independent/Modified Independent             Mobility Comments: Pt reports he transfers confidently to/from w/c and uses prosthesis with crutches for short distances ADLs Comments: spouse assists as needed but pt endroses doing majority himself. Wife assists with IADLs.    OT Problem List: Decreased strength;Impaired balance (sitting and/or standing);Decreased safety awareness;Decreased activity tolerance   OT Treatment/Interventions: Self-care/ADL training;Therapeutic exercise;Energy conservation;Therapeutic activities      OT Goals(Current goals can be found in the care plan section)   Acute Rehab OT Goals Patient Stated Goal: to get stronger OT Goal Formulation: With patient/family Time For Goal Achievement: 12/22/23 Potential to Achieve Goals: Fair ADL Goals Pt Will Perform Grooming: with modified independence;sitting Pt Will Perform Lower Body Dressing: with modified independence;sitting/lateral leans Pt Will Transfer to Toilet: bedside commode;with supervision Pt Will Perform Toileting - Clothing Manipulation and hygiene: with modified independence   OT Frequency:  Min 2X/week       AM-PAC OT 6 Clicks Daily Activity     Outcome Measure Help from another person eating meals?: None Help from another person taking care of personal grooming?: None Help from another person toileting, which includes using toliet, bedpan, or urinal?: A Little Help from another person bathing (including washing, rinsing, drying)?: A Little Help from  another person to put on and taking off regular upper body clothing?: A Little Help from another person to put on and taking off regular lower body clothing?: A Little 6 Click Score: 20   End of Session Equipment Utilized During Treatment: Oxygen  Activity Tolerance: Patient tolerated treatment well Patient left: in bed;with call bell/phone within reach;with bed alarm set;with family/visitor present  OT Visit Diagnosis: Unsteadiness on feet (R26.81);Repeated falls (R29.6)                Time: 1010-1041 OT Time Calculation (min): 31 min Charges:  OT General Charges $OT Visit: 1 Visit OT Evaluation $OT Eval Low Complexity: 1 Low OT Treatments $Self Care/Home Management : 8-22 mins  Izetta Claude, MS, OTR/L , CBIS ascom [phone]  12/08/23, 3:01 PM

## 2023-12-08 NOTE — Consult Note (Addendum)
 NAME: Manuel [name]  DOB: 1946-02-01  MRN: [id number]  Date/Time: 12/08/2023 1:11 PM  REQUESTING PROVIDER: Dr. Von  Subjective:  REASON FOR CONSULT: Serratia bacteremia History from patient and medical records.  Son at bedside IRL Manuel [name] is a 78 y.o. with a history of DM, , HTN, HLD, ischemic cardiomyopathy, OSA , PAD , rt BKA presents with fall while transitioning from bed to wheel chair on 12/04/23, and lay on the floor for couple of hrs.  wife was not at home and later found him and called EMS, but he refused to come to ED initially and then over the course of the day felt unwell with diffuse shaking and called EMS and came t0 ED.  He also had severe right groin pain. In the ED vitals   12/04/23 11:35  BP 131/72  Temp 99.3 F (37.4 C)  Pulse Rate 113 !  Resp 19  SpO2 95 %     Latest Reference Range & Units 12/04/23 11:41  WBC 4.0 - 10.5 K/uL 11.6 (H)  Hemoglobin 13.0 - 17.0 g/dL 85.6  HCT 60.9 - 47.9 % 42.9  Platelets 150 - 400 K/uL 191  Creatinine 0.61 - 1.24 mg/dL 8.87  A CT of the abdomen chest and pelvis was done and it showed A large hematoma surrounding the right common femoral artery with active arterial extravasation of contrast into the hematoma.  There was a 3.2 cm infrarenal abdominal aortic aneurysm.  Blood culture sent . He was seen by vascular and taken to the OR and underwent angio and found to have acute rupture of the right common femoral artery and critical stenosis at the ostium of the profundus femoris.  He underwent transluminal angioplasty and stent placement right profunda femoris and common femoral artery extending to the distal  right external iliac artery. I am seeing the patient because he has Serratia bacteremia    Complicated PAD history Underwent endarterectomies of both rt and left common femoral, profunda and superficial femoral artery, fogary embolectomy of rt SFA, stent placement in b/l common iliac arteries, left external iliac and Rt  PTA, on 11/15/22  rt 4/5 toe amputation in July 2024,  ,wound dehiscence on the rt groin  , he underwent debridement of the wound on the rt groin on 01/09/23 . No culture was sent then. Wound vac application Returned to ED on 9/25 with leg pain Rt > left and worsening SOB. Treated for NSTEMI . Saw Dr.Dew on 10/25 and he thought e wound had healed well and removed wound vac, he went to the ED the same day with weakness and weight loss of 20 pounds and drainage from the rt foot ulcer  Underwent TMA on 02/17/23. The rt groin had  a superficial wound with some dainage . Culture sent   He had serratia , staph aureus and ecoli in culture . I saw the patient then and he received Iv antibiotic Vanco/cefepime  /flagyl  for 6 days  followed by PO cipro + Augmentin  for 10 more days.  was treated like a superficial infection then.  .  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  Rt knee septic arthritis and had  I/D of Rt knee on 04/21/23  culture NG- was treated with ceftraxone+ vanco IV for  2 weeks followed by Cefudroxil + doxy fpr 2 more weeks/until 05/20/23. I last saw him on 05/20/23 thru a video visit  and he was doing well then.    Past Medical History:  Diagnosis Date   Acute ST elevation myocardial infarction (STEMI) of inferior wall (HCC) 04/19/2010   a.) transfered from [facility] to [facility] --> LHC/PCI (very difficult procedure) --> 3.0 x 23 mm and 3.0 x 12 mm Xience stents to RCA   Allergies    Arthritis    Benign essential hypertension    Bilateral carotid artery disease (HCC) 05/08/2021   a.) carotid doppler 05/08/2021: 1-39% BICA   CAD (coronary artery disease) 04/19/2010   a.) inferior STEMI 04/19/2010 --> LHC/PCI: 50-70% pD1, 80% pRI, 90/90/90% RCA (overlapping 3.0 x 23 and 3.0 x 12 mm Xience DES); b.) MV 11/10/2018: fixed minimally reversible inferior perfusion defect   Cellulitis of foot    Chronic HFrEF  (heart failure with reduced ejection fraction) (HCC)    DDD (degenerative disc disease), cervical    Diabetes mellitus type 2, insulin  dependent (HCC)    Diverticulosis    Full dentures    Gout    Hard of hearing    History of bilateral cataract extraction 2022   History of ETOH abuse    Hyperlipidemia    Ischemic cardiomyopathy 04/19/2010   a.) TTE 04/19/2010: 40%; b.) TTE 04/20/2014: EF >55%; c.) TTE 11/10/2018: EF 45%; d.) TTE 11/14/2022: EF 25-30%; e. 06/2023 Echo: EF 25-30%, nl RV size/fxn. Mild MR. Mild-mod TR. Ao sclerosis w/o stenosis.   Long term current use of aspirin     Long term current use of clopidogrel     Lumbar degenerative disc disease    Lumbar radiculopathy    Lumbar vertebral fracture (chronic superior endplate of L1)    NSTEMI (non-ST elevated myocardial infarction) (HCC) 01/15/2023   OSA (obstructive sleep apnea)    a.) unable to tolerate nocturnal PAP therapy   Peripheral artery disease (HCC)    a.) stenting 05/14/21: 12 mm x 12 cm LifeStent RIGHT dis SFA/prox pop; b.) s/p cath directed thrombolysis RIGHT SFA/pop 06/14/21; c.) s/p mech thrombectomy + stenting 06/15/21: 8 mm x 25cm & 8 mm x 7.5cm Viabahn; d.) s/p BILAT CFA, profunda femoris, SFA endarterectomies + fogarty embolectomy + stenting 11/15/22: 12mm x 58mm Lifestream BILAT CIAs, 14 mm x 6 cm Lifestream & 13 mm x 5 cm Viabahn LEFT EIA   Peripheral neuropathy    Umbilical hernia     Past Surgical History:  Procedure Laterality Date   AMPUTATION Right 11/18/2022   Procedure: AMPUTATION 4TH AND 5TH RAY;  Surgeon: [name], [name];  Location: [facility];  Service: Orthopedics/Podiatry;  Laterality: Right;  4th and 5th toe   AMPUTATION Right 04/20/2023   Procedure: AMPUTATION BELOW KNEE;  Surgeon: [name], MD;  Location: [facility];  Service: Vascular;  Laterality: Right;  block then general   APPLICATION OF WOUND VAC Right 01/09/2023   Procedure: APPLICATION OF WOUND VAC;  Surgeon: [name], MD;  Location:  [facility];  Service: Vascular;  Laterality: Right;   CATARACT EXTRACTION W/PHACO Right 03/14/2021   Procedure: CATARACT EXTRACTION PHACO AND INTRAOCULAR LENS PLACEMENT (IOC) RIGHT DIABETIC;  Surgeon: [name], MD;  Location: [facility];  Service: Ophthalmology;  Laterality: Right;  Diabetic 16.78 01:39.9   CATARACT EXTRACTION W/PHACO Left 03/28/2021   Procedure: CATARACT EXTRACTION PHACO AND INTRAOCULAR LENS PLACEMENT (IOC) LEFT DIABETIC 6.93 01:22.0;  Surgeon: [name], MD;  Location: [facility];  Service: Ophthalmology;  Laterality: Left;  Diabetic   COLONOSCOPY     CORONARY ANGIOPLASTY WITH STENT PLACEMENT  03/2010   Procedure: CORONARY ANGIOPLASTY WITH STENT PLACEMENT; Location: [name]   CORONARY STENT INTERVENTION N/A 07/14/2023   Procedure: CORONARY STENT INTERVENTION;  Surgeon: [name], MD;  Location: [facility];  Service: Cardiovascular;  Laterality: N/A;   ENDARTERECTOMY FEMORAL Bilateral 11/15/2022   Procedure: BILATERAL COMMON FEMORAL PROFUNDA FEMORIS AND SUPERFICIAL FEMORAL ARTERY ENDARTECTOMIES, RIGHT FOGARTY EMBOLECTOMY OF THE RIGHT SFA  AND  POPLITEAL ARTERIES. AORTAGRAM AND RIGHT LOWER EXTREMITY ANGIOGRAM.;  Surgeon: [name], MD;  Location: [facility];  Service: Vascular;  Laterality: Bilateral;   INSERTION OF ILIAC STENT Bilateral 11/15/2022   Procedure: BILATERAL STENT INSERTION IN BILATERAL  COMMON ILIAC ARTERY, STENT INSERTION OF LEFT EXTERNAL ILIAC ARTERY. ANGIOPLASTY RIGHT TIBIAL  AND POPLITEAL ARTERY.;  Surgeon: [name], MD;  Location: [facility];  Service: Vascular;  Laterality: Bilateral;   IRRIGATION AND DEBRIDEMENT KNEE Right 04/21/2023   Procedure: IRRIGATION AND DEBRIDEMENT KNEE;  Surgeon: Zafonte, Brian Dontea, MD;  Location: [facility];  Service: Orthopedics;  Laterality: Right;   LOWER EXTREMITY ANGIOGRAPHY Right 05/14/2021   Procedure: LOWER EXTREMITY ANGIOGRAPHY;  Surgeon: [name], MD;  Location:  [facility];  Service: Cardiovascular;  Laterality: Right;   LOWER EXTREMITY ANGIOGRAPHY Right 06/14/2021   Procedure: Lower Extremity Angiography;  Surgeon: [name], MD;  Location: [facility];  Service: Cardiovascular;  Laterality: Right;   LOWER EXTREMITY ANGIOGRAPHY Right 11/11/2022   Procedure: Lower Extremity Angiography;  Surgeon: [name], MD;  Location: [facility];  Service: Cardiovascular;  Laterality: Right;   LOWER EXTREMITY ANGIOGRAPHY Right 12/04/2023   Procedure: Lower Extremity Angiography;  Surgeon: [name], MD;  Location: [facility];  Service: Cardiovascular;  Laterality: Right;   LOWER EXTREMITY INTERVENTION Right 06/15/2021   Procedure: LOWER EXTREMITY INTERVENTION;  Surgeon: [name], MD;  Location: [facility];  Service: Cardiovascular;  Laterality: Right;   RIGHT/LEFT HEART CATH AND CORONARY ANGIOGRAPHY N/A 07/14/2023   Procedure: RIGHT/LEFT HEART CATH AND CORONARY ANGIOGRAPHY;  Surgeon: [name], MD;  Location: [facility];  Service: Cardiovascular;  Laterality: N/A;   TONSILLECTOMY     TRANSMETATARSAL AMPUTATION Right 02/17/2023   Procedure: TRANSMETATARSAL AMPUTATION;  Surgeon: Lennie Barter, [name];  Location: [facility];  Service: Orthopedics/Podiatry;  Laterality: Right;   WOUND DEBRIDEMENT Right 01/09/2023   Procedure: DEBRIDEMENT WOUND;  Surgeon: [name], MD;  Location: [facility];  Service: Vascular;  Laterality: Right;    Social History   Socioeconomic History   Marital status: Married    Spouse name: [name]   Number of children: 3   Years of education: Not on file   Highest education level: Not on file  Occupational History   Not on file  Tobacco Use   Smoking status: Never    Passive exposure: Never   Smokeless tobacco: Never  Vaping Use   Vaping status: Never Used  Substance and Sexual Activity   Alcohol  use: Yes    Alcohol /week: 21.0 standard drinks of alcohol      Types: 7 Glasses of wine, 14 Cans of beer per week    Comment: occassional   Drug use: No   Sexual activity: Not on file  Other Topics Concern   Not on file  Social History Narrative   Not on file   Social Drivers of Health   Financial Resource Strain: Low Risk  (07/11/2023)   Overall Financial Resource Strain (CARDIA)    Difficulty  of Paying Living Expenses: Not hard at all  Food Insecurity: No Food Insecurity (12/05/2023)   Hunger Vital Sign    Worried About Running Out of Food in the Last Year: Never true    Ran Out of Food in the Last Year: Never true  Transportation Needs: No Transportation Needs (12/05/2023)   PRAPARE - Administrator, Civil Service (Medical): No    Lack of Transportation (Non-Medical): No  Physical Activity: Not on file  Stress: Not on file  Social Connections: Moderately Isolated (12/05/2023)   Social Connection and Isolation Panel    Frequency of Communication with Friends and Family: More than three times a week    Frequency of Social Gatherings with Friends and Family: More than three times a week    Attends Religious Services: Patient declined    Database administrator or Organizations: No    Attends Banker Meetings: Patient declined    Marital Status: Married  Catering manager Violence: Not At Risk (12/05/2023)   Humiliation, Afraid, Rape, and Kick questionnaire    Fear of Current or Ex-Partner: No    Emotionally Abused: No    Physically Abused: No    Sexually Abused: No    Family History  Problem Relation Age of Onset   Scoliosis Mother    Heart disease Father    No Known Allergies I? Current Facility-Administered Medications  Medication Dose Route Frequency Provider Last Rate Last Admin   acetaminophen  (TYLENOL ) tablet 650 mg  650 mg Oral Q4H PRN Schnier, Gregory G, MD       albuterol  (PROVENTIL ) (2.5 MG/3ML) 0.083% nebulizer solution 2.5 mg  2.5 mg Nebulization Q2H PRN Karan, Sara-Maiz A, MD       alum & mag  hydroxide-simeth (MAALOX/MYLANTA) 200-200-20 MG/5ML suspension 15 mL  15 mL Oral Q6H PRN Von Bellis, MD   15 mL at 12/07/23 1224   artificial tears ophthalmic solution 1 drop  1 drop Both Eyes PRN Von Bellis, MD   1 drop at 12/07/23 1224   aspirin  EC tablet 81 mg  81 mg Oral Daily Vikrant, Sara-Maiz A, MD   81 mg at 12/08/23 0920   atorvastatin  (LIPITOR ) tablet 80 mg  80 mg Oral QHS Waleed, Sara-Maiz A, MD   80 mg at 12/07/23 2017   bisacodyl  (DULCOLAX) EC tablet 10 mg  10 mg Oral QHS Von Bellis, MD   10 mg at 12/07/23 2021   bisacodyl  (DULCOLAX) suppository 10 mg  10 mg Rectal Daily PRN Von Bellis, MD       cefTRIAXone  (ROCEPHIN ) 2 g in sodium chloride  0.9 % 100 mL IVPB  2 g Intravenous Q24H Von Bellis, MD 200 mL/hr at 12/07/23 1717 Infusion Verify at 12/07/23 1717   Chlorhexidine  Gluconate Cloth 2 % PADS 6 each  6 each Topical Daily Debby Camila LABOR, MD   6 each at 12/08/23 0920   clopidogrel  (PLAVIX ) tablet 75 mg  75 mg Oral Daily Arida, Muhammad A, MD   75 mg at 12/08/23 0920   dextrose  50 % solution 0-50 mL  0-50 mL Intravenous PRN Rust-Chester, Jenita CROME, NP       fluticasone  (FLONASE ) 50 MCG/ACT nasal spray 1 spray  1 spray Each Nare BID PRN Von Bellis, MD       folic acid  (FOLVITE ) tablet 1 mg  1 mg Oral Daily Saxon, Sara-Maiz A, MD   1 mg at 12/08/23 0920   gabapentin  (NEURONTIN ) capsule 100 mg  100 mg Oral  QHS Cleatus Delayne GAILS, MD   100 mg at 12/07/23 2230   heparin  ADULT infusion 100 units/mL (25000 units/250mL)  2,050 Units/hr Intravenous Continuous Von Bellis, MD 20.5 mL/hr at 12/08/23 0305 2,050 Units/hr at 12/08/23 0305   insulin  aspart (novoLOG ) injection 0-15 Units  0-15 Units Subcutaneous Q4H Von Bellis, MD   5 Units at 12/07/23 2020   insulin  glargine-yfgn (SEMGLEE ) injection 14 Units  14 Units Subcutaneous Q2200 Debby Camila LABOR, MD   14 Units at 12/07/23 2020   melatonin tablet 5 mg  5 mg Oral QHS Von Bellis, MD   5 mg at 12/07/23 2230   morphine   (PF) 2 MG/ML injection 2 mg  2 mg Intravenous Q1H PRN Schnier, Gregory G, MD   2 mg at 12/07/23 2016   multivitamin with minerals tablet 1 tablet  1 tablet Oral Daily Debby Camila A, MD   1 tablet at 12/08/23 0920   ondansetron  (ZOFRAN ) injection 4 mg  4 mg Intravenous Q6H PRN Schnier, Gregory G, MD   4 mg at 12/07/23 1656   oxyCODONE  (Oxy IR/ROXICODONE ) immediate release tablet 5-10 mg  5-10 mg Oral Q4H PRN Schnier, Gregory G, MD   10 mg at 12/08/23 0543   polyethylene glycol (MIRALAX  / GLYCOLAX ) packet 17 g  17 g Oral BID Von Bellis, MD   17 g at 12/07/23 2017   sodium chloride  flush (NS) 0.9 % injection 3 mL  3 mL Intravenous Q12H Schnier, Cordella MATSU, MD   3 mL at 12/08/23 0920   sodium chloride  flush (NS) 0.9 % injection 3 mL  3 mL Intravenous PRN Schnier, Cordella MATSU, MD       thiamine  (VITAMIN B1) tablet 100 mg  100 mg Oral Daily Rmani, Sara-Maiz A, MD   100 mg at 12/08/23 9079   Or   thiamine  (VITAMIN B1) injection 100 mg  100 mg Intravenous Daily Debby Camila LABOR, MD       traZODone  (DESYREL ) tablet 50 mg  50 mg Oral QHS PRN Duncan, Hazel V, MD   50 mg at 12/07/23 2230     Abtx:  Anti-infectives (From admission, onward)    Start     Dose/Rate Route Frequency Ordered Stop   12/07/23 1800  cefTRIAXone  (ROCEPHIN ) 2 g in sodium chloride  0.9 % 100 mL IVPB        2 g 200 mL/hr over 30 Minutes Intravenous Every 24 hours 12/07/23 1419     12/06/23 0300  vancomycin  (VANCOREADY) IVPB 1250 mg/250 mL  Status:  Discontinued        1,250 mg 166.7 mL/hr over 90 Minutes Intravenous Every 24 hours 12/05/23 0103 12/05/23 0713   12/05/23 0145  ceFEPIme  (MAXIPIME ) 2 g in sodium chloride  0.9 % 100 mL IVPB  Status:  Discontinued        2 g 200 mL/hr over 30 Minutes Intravenous Every 12 hours 12/05/23 0049 12/07/23 1419   12/05/23 0145  vancomycin  (VANCOREADY) IVPB 2000 mg/400 mL        2,000 mg 200 mL/hr over 120 Minutes Intravenous  Once 12/05/23 0049 12/05/23 0400   12/05/23 0100   azithromycin  (ZITHROMAX ) 500 mg in sodium chloride  0.9 % 250 mL IVPB  Status:  Discontinued        500 mg 250 mL/hr over 60 Minutes Intravenous Every 24 hours 12/05/23 0003 12/05/23 0713   12/05/23 0000  ceFAZolin  (ANCEF ) IVPB 1 g/50 mL premix  Status:  Discontinued       Note to Pharmacy:  Send with pt to OR   1 g 100 mL/hr over 30 Minutes Intravenous On call 12/04/23 1814 12/04/23 1922   12/04/23 2030  ceFAZolin  (ANCEF ) IVPB 2g/100 mL premix  Status:  Discontinued        2 g 200 mL/hr over 30 Minutes Intravenous  Once 12/04/23 2025 12/04/23 2122   12/04/23 1911  ceFAZolin  (ANCEF ) IVPB 1 g/50 mL premix        over 30 Minutes  Continuous PRN 12/04/23 1923 12/04/23 2032       REVIEW OF SYSTEMS:  Const: Patient states he was feeling unwell for a few days.  Now he is feeling better appetite better fatigue Eyes: negative diplopia or visual changes, negative eye pain ENT: negative coryza, negative sore throat Resp: negative cough, hemoptysis, dyspnea Cards: negative for chest pain, palpitations, lower extremity edema GU: negative for frequency, dysuria and hematuria GI:  poor appetite Skin: negative for rash and pruritus Heme: negative for easy bruising and gum/nose bleeding MS:, Lethargy Neurolo: Weakness Psych: negative for feelings of anxiety, depression  Endocrine: diabetes Allergy/Immunology- negative for any medication or food allergies  Objective:  VITALS:  BP 122/76 (BP Location: Right Arm)   Pulse 81   Temp 97.6 F (36.4 C)   Resp 18   Ht 5' 8 (1.727 m)   Wt 81.6 kg   SpO2 93%   BMI 27.37 kg/m   PHYSICAL EXAM:  General: Alert, cooperative, no distress, appears younger for his age right Head: Normocephalic, without obvious abnormality, atraumatic. Eyes: Conjunctivae clear, anicteric sclerae. Pupils are equal ENT Nares normal. No drainage or sinus tenderness. Lips, mucosa, and tongue normal. No Thrush Neck: Supple, symmetrical, no adenopathy, thyroid: non  tender no carotid bruit and no JVD. Back: No CVA tenderness. Lungs: Clear to auscultation bilaterally. No Wheezing or Rhonchi. No rales. Heart: Regular rate and rhythm, no murmur, rub or gallop. Abdomen: Soft, non-tender,not distended. Bowel sounds normal. No masses Extremities: Right groin area there is a dressing which is Saturated in blood.  There is no active bleeding. Right BKA.  Stump healed well Edema of the stump Left leg severe edema with induration Superficial abrasions over the shin on the left leg Skin: No rashes or lesions. Or bruising Lymph: Cervical, supraclavicular normal. Neurologic: Grossly non-focal Pertinent Labs Lab Results CBC    Component Value Date/Time   WBC 7.4 12/08/2023 0553   RBC 4.08 (L) 12/08/2023 0553   HGB 11.7 (L) 12/08/2023 0553   HGB 15.7 02/16/2012 2013   HCT 35.1 (L) 12/08/2023 0553   HCT 44.4 02/16/2012 2013   PLT 213 12/08/2023 0553   PLT 176 02/16/2012 2013   MCV 86.0 12/08/2023 0553   MCV 91 02/16/2012 2013   MCH 28.7 12/08/2023 0553   MCHC 33.3 12/08/2023 0553   RDW 15.9 (H) 12/08/2023 0553   RDW 13.0 02/16/2012 2013   LYMPHSABS 0.5 (L) 12/04/2023 1141   MONOABS 0.5 12/04/2023 1141   EOSABS 0.1 12/04/2023 1141   BASOSABS 0.0 12/04/2023 1141       Latest Ref Rng & Units 12/08/2023    5:53 AM 12/07/2023    5:38 AM 12/06/2023    6:11 AM  CMP  Glucose 70 - 99 mg/dL 82  861  850   BUN 8 - 23 mg/dL 27  29  30    Creatinine 0.61 - 1.24 mg/dL 8.97  8.95  8.74   Sodium 135 - 145 mmol/L 134  133  129   Potassium 3.5 - 5.1 mmol/L  4.0  4.1  4.0   Chloride 98 - 111 mmol/L 101  101  99   CO2 22 - 32 mmol/L 25  21  21    Calcium  8.9 - 10.3 mg/dL 8.6  8.3  7.9   Total Protein 6.5 - 8.1 g/dL 6.2  5.6  5.5   Total Bilirubin 0.0 - 1.2 mg/dL 0.7  0.9  1.0   Alkaline Phos 38 - 126 U/L 76  73  60   AST 15 - 41 U/L 25  33  37   ALT 0 - 44 U/L 22  26  19        Microbiology: Recent Results (from the past 240 hours)  Resp panel by RT-PCR  (RSV, Flu A&B, Covid) Anterior Nasal Swab     Status: None   Collection Time: 12/04/23  3:48 PM   Specimen: Anterior Nasal Swab  Result Value Ref Range Status   SARS Coronavirus 2 by RT PCR NEGATIVE NEGATIVE Final    Comment: (NOTE) SARS-CoV-2 target nucleic acids are NOT DETECTED.  The SARS-CoV-2 RNA is generally detectable in upper respiratory specimens during the acute phase of infection. The lowest concentration of SARS-CoV-2 viral copies this assay can detect is 138 copies/mL. A negative result does not preclude SARS-Cov-2 infection and should not be used as the sole basis for treatment or other patient management decisions. A negative result may occur with  improper specimen collection/handling, submission of specimen other than nasopharyngeal swab, presence of viral mutation(s) within the areas targeted by this assay, and inadequate number of viral copies(<138 copies/mL). A negative result must be combined with clinical observations, patient history, and epidemiological information. The expected result is Negative.  Fact Sheet for Patients:  BloggerCourse.com  Fact Sheet for Healthcare Providers:  SeriousBroker.it  This test is no t yet approved or cleared by the [place]  FDA and  has been authorized for detection and/or diagnosis of SARS-CoV-2 by FDA under an Emergency Use Authorization (EUA). This EUA will remain  in effect (meaning this test can be used) for the duration of the COVID-19 declaration under Section 564(b)(1) of the Act, 21 U.S.C.section 360bbb-3(b)(1), unless the authorization is terminated  or revoked sooner.       Influenza A by PCR NEGATIVE NEGATIVE Final   Influenza B by PCR NEGATIVE NEGATIVE Final    Comment: (NOTE) The Xpert Xpress SARS-CoV-2/FLU/RSV plus assay is intended as an aid in the diagnosis of influenza from Nasopharyngeal swab specimens and should not be used as a sole basis for  treatment. Nasal washings and aspirates are unacceptable for Xpert Xpress SARS-CoV-2/FLU/RSV testing.  Fact Sheet for Patients: BloggerCourse.com  Fact Sheet for Healthcare Providers: SeriousBroker.it  This test is not yet approved or cleared by the [place]  FDA and has been authorized for detection and/or diagnosis of SARS-CoV-2 by FDA under an Emergency Use Authorization (EUA). This EUA will remain in effect (meaning this test can be used) for the duration of the COVID-19 declaration under Section 564(b)(1) of the Act, 21 U.S.C. section 360bbb-3(b)(1), unless the authorization is terminated or revoked.     Resp Syncytial Virus by PCR NEGATIVE NEGATIVE Final    Comment: (NOTE) Fact Sheet for Patients: BloggerCourse.com  Fact Sheet for Healthcare Providers: SeriousBroker.it  This test is not yet approved or cleared by the [place]  FDA and has been authorized for detection and/or diagnosis of SARS-CoV-2 by FDA under an Emergency Use Authorization (EUA). This EUA will remain in effect (meaning this test can be used)  for the duration of the COVID-19 declaration under Section 564(b)(1) of the Act, 21 U.S.C. section 360bbb-3(b)(1), unless the authorization is terminated or revoked.  Performed at [facility], [address] [place], [place] [address]   Culture, blood (routine x 2)     Status: Abnormal   Collection Time: 12/04/23  3:48 PM   Specimen: BLOOD  Result Value Ref Range Status   Specimen Description   Final    BLOOD BLOOD RIGHT ARM Performed at [facility], [address], [place], [place] [address]    Special Requests   Final    BOTTLES DRAWN AEROBIC AND ANAEROBIC Blood Culture adequate volume Performed at [facility], [address] [place], [place] [address]    Culture  Setup Time   Final    GRAM NEGATIVE RODS IN BOTH  AEROBIC AND ANAEROBIC BOTTLES CRITICAL RESULT CALLED TO, READ BACK BY AND VERIFIED WITH: JASON ROBBINS @0700  12/05/23 MJU    Culture SERRATIA MARCESCENS (A)  Final   Report Status 12/07/2023 FINAL  Final   Organism ID, Bacteria SERRATIA MARCESCENS  Final      Susceptibility   Serratia marcescens - MIC*    CEFEPIME  <=0.12 SENSITIVE Sensitive     ERTAPENEM <=0.12 SENSITIVE Sensitive     CEFTRIAXONE  1 SENSITIVE Sensitive     CIPROFLOXACIN  0.12 SENSITIVE Sensitive     GENTAMICIN  <=1 SENSITIVE Sensitive     MEROPENEM <=0.25 SENSITIVE Sensitive     TRIMETH /SULFA  <=20 SENSITIVE Sensitive     AMIKACIN 4 SENSITIVE Sensitive     CEFTAZIDIME/AVIBACTAM <=0.12 SENSITIVE Sensitive ug/mL    MEROPENEM/VABORBACTAM <=0.5 SENSITIVE Sensitive ug/mL    TOBRAMYCIN 4 SENSITIVE Sensitive     * SERRATIA MARCESCENS  Culture, blood (routine x 2)     Status: Abnormal   Collection Time: 12/04/23  3:48 PM   Specimen: BLOOD  Result Value Ref Range Status   Specimen Description   Final    BLOOD BLOOD LEFT ARM Performed at [facility], [address] [place], [place] [address]    Special Requests   Final    BOTTLES DRAWN AEROBIC AND ANAEROBIC Blood Culture adequate volume Performed at [facility], [address] [place], [place] [address]    Culture  Setup Time   Final    IN BOTH AEROBIC AND ANAEROBIC BOTTLES GRAM NEGATIVE RODS CRITICAL RESULT CALLED TO, READ BACK BY AND VERIFIED WITH: JASON ROBBINS @0700  12/05/23 MJU    Culture (A)  Final    SERRATIA MARCESCENS SUSCEPTIBILITIES PERFORMED ON PREVIOUS CULTURE WITHIN THE LAST 5 DAYS. Performed at [facility], [address], [place], [place] [address]    Report Status 12/07/2023 FINAL  Final  Blood Culture ID Panel (Reflexed)     Status: Abnormal   Collection Time: 12/04/23  3:48 PM  Result Value Ref Range Status   Enterococcus faecalis NOT DETECTED NOT DETECTED Final   Enterococcus Faecium NOT DETECTED NOT DETECTED Final    Listeria monocytogenes NOT DETECTED NOT DETECTED Final   Staphylococcus species NOT DETECTED NOT DETECTED Final   Staphylococcus aureus (BCID) NOT DETECTED NOT DETECTED Final   Staphylococcus epidermidis NOT DETECTED NOT DETECTED Final   Staphylococcus lugdunensis NOT DETECTED NOT DETECTED Final   Streptococcus species NOT DETECTED NOT DETECTED Final   Streptococcus agalactiae NOT DETECTED NOT DETECTED Final   Streptococcus pneumoniae NOT DETECTED NOT DETECTED Final   Streptococcus pyogenes NOT DETECTED NOT DETECTED Final   A.calcoaceticus-baumannii NOT DETECTED NOT DETECTED Final  Bacteroides fragilis NOT DETECTED NOT DETECTED Final   Enterobacterales DETECTED (A) NOT DETECTED Final    Comment: Enterobacterales represent a large order of gram negative bacteria, not a single organism. CRITICAL RESULT CALLED TO, READ BACK BY AND VERIFIED WITH: JASON ROBBINS @0700  12/05/23 MJU    Enterobacter cloacae complex NOT DETECTED NOT DETECTED Final   Escherichia coli NOT DETECTED NOT DETECTED Final   Klebsiella aerogenes NOT DETECTED NOT DETECTED Final   Klebsiella oxytoca NOT DETECTED NOT DETECTED Final   Klebsiella pneumoniae NOT DETECTED NOT DETECTED Final   Proteus species NOT DETECTED NOT DETECTED Final   Salmonella species NOT DETECTED NOT DETECTED Final   Serratia marcescens DETECTED (A) NOT DETECTED Final    Comment: CRITICAL RESULT CALLED TO, READ BACK BY AND VERIFIED WITH: JASON ROBBINS @0700  12/05/23 MJU    Haemophilus influenzae NOT DETECTED NOT DETECTED Final   Neisseria meningitidis NOT DETECTED NOT DETECTED Final   Pseudomonas aeruginosa NOT DETECTED NOT DETECTED Final   Stenotrophomonas maltophilia NOT DETECTED NOT DETECTED Final   Candida albicans NOT DETECTED NOT DETECTED Final   Candida auris NOT DETECTED NOT DETECTED Final   Candida glabrata NOT DETECTED NOT DETECTED Final   Candida krusei NOT DETECTED NOT DETECTED Final   Candida parapsilosis NOT DETECTED NOT DETECTED  Final   Candida tropicalis NOT DETECTED NOT DETECTED Final   Cryptococcus neoformans/gattii NOT DETECTED NOT DETECTED Final   CTX-M ESBL NOT DETECTED NOT DETECTED Final   Carbapenem resistance IMP NOT DETECTED NOT DETECTED Final   Carbapenem resistance KPC NOT DETECTED NOT DETECTED Final   Carbapenem resistance NDM NOT DETECTED NOT DETECTED Final   Carbapenem resist OXA 48 LIKE NOT DETECTED NOT DETECTED Final   Carbapenem resistance VIM NOT DETECTED NOT DETECTED Final    Comment: Performed at [facility], [address] [place], [place] [address]  MRSA Next Gen by PCR, Nasal     Status: None   Collection Time: 12/04/23  9:30 PM   Specimen: Nasal Mucosa; Nasal Swab  Result Value Ref Range Status   MRSA by PCR Next Gen NOT DETECTED NOT DETECTED Final    Comment: (NOTE) The GeneXpert MRSA Assay (FDA approved for NASAL specimens only), is one component of a comprehensive MRSA colonization surveillance program. It is not intended to diagnose MRSA infection nor to guide or monitor treatment for MRSA infections. Test performance is not FDA approved in patients less than 10 years old. Performed at [facility], [address] [place], [place] [address]     IMAGING RESULTS:  I have personally reviewed the films Hematoma over the right groin with active extravasation of the contrast into the hematoma from the femoral artery  Impression/Recommendation Rt  common femoral artery  graft with rupture and  leaking into the subcutaneous tissue, forming a hematoma Patient underwent angio and had stent placement  There is some oozing of blood from the hematoma at the groin.  I&D of the hematoma is being planned.  Discussed with Dr. 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 HE will send deep cultures Will have to treat this as endovascular infection  with 4-6 weeks of antibiotic-    H/o bilateral endarterectomies of  rt and left common femoral, profunda and superficial femoral artery, fogary embolectomy of rt SFA, stent placement in b/l common iliac arteries, left external iliac and Rt PTA, on 11/15/22. Also had bovine pericardial patch angioplasties This was complicated by wound dehiscence on the right groin in sept  2024.  Underwent debridement.  Superficial Culture from rt groin in Oct 2024 was Serratia along with Staphylococcus aureus and he received 14 days of appropriate antibiotics ? _RT BKA  Left leg edema  CAD  DM  This consult involved complex antimicrobial management  I have personally spent  -75--minutes involved in face-to-face and non-face-to-face activities for this patient on the day of the visit. Professional time spent includes the following activities: Preparing to see the patient (review of tests), Obtaining and/or reviewing separately obtained history (admission/discharge record), Performing a medically appropriate examination and/or evaluation , Ordering medications/tests/procedures, g and communicating with vascular surgeon, Documenting clinical information in the EMR, Independently interpreting results (not separately reported), Communicating results to the patient/son , Counseling and educating the patient/son and Care coordination (not separately reported).    ________________________________________________  Note:  This document was prepared using Conservation officer, historic buildings and may include unintentional dictation errors.

## 2023-12-08 NOTE — Telephone Encounter (Signed)
 Called and LM that I am concerned about Manuel [name] and was calling to check on him.

## 2023-12-08 NOTE — Progress Notes (Signed)
 [facility] Progress Note  Patient: Manuel [name]    FMW:[id number]  DOA: 12/04/2023     Date of Service: the patient was seen and examined on 12/08/2023  Chief Complaint  Patient presents with   Weakness   Brief hospital course: Manuel [name] is a 78 y.o. 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 Patient fell with walker landing on top of him.  EMS was called for assistance.  Patient refused to be transported to ED for evaluation initial. However as time when on patient note more intense pain in right groin area and due to his EMS was called back and patient was transported to ED.      ED Course:  99.3/tmx 100.7 BP 131/72, hr 113, rr 19 sat 95%  UA :rare bacteria , ketones 20 ,  Wbc 11.6, hgb 14.3, plt 191, increase pmn Na 126 ( 137), K 5.3, cl91, glu 438, cr 1.12 Lactic 2.9 ,3.3 Trop 284,3403, 8119 EKG: sinus tachycardia , RAD twave change inferior lateral leads  Patient on evaluation found to have right femoral a rupture, vascular consulted and performed repair.     Patient course was further complicated by NTSEMI , s/p right femoral a repair patient was started on heparin  drip . Cardiology was consulted and will see patient in am , but plans for intervention  recommend medical management.   PCCM was involved for comanagement. [facility] was consulted for admission and further management as below.   Assessment and Plan:  # Right femoral artery Rupture s/p repair S/p right EIA/CFA stenting, PTA of PFA -follow vascular recs  8/17 Increased opening of skin and subcutaneous tissue from prior hematoma.    Patient Will need operative hematoma evacuation/I&D and  closure over the next day or so.  Follow-up with vascular surgery for further recommendation and DC planning  # NSTEMI -CAD s/p pic RCA c/b NSTEMI 06/2023 s/p stent x 3 to LAD Continue aspirin  and statin 8/15 started Plavix  continue heparin  drip  -TTE LVEF 20 to 25%, severely decreased function and global hypokinesis. Grade 1 DD.  RV systolic function is severely reduced. -Troponin peaked 8119 >> 7607 - Cardiology following  # Biventricular systolic failure and diastolic dysfunction  TTE as above -Appears euvolemic -No GDMT due to low normal BP. -Restart GDMT when BP permits Follow cardiology   # CAP with Sepsis, Serratia bacteremia -  Opacities on chest imaging /fever/mild leukocytosis/lactic acidosis -s/p Vanco azithromycin  and cefepime  MRSA PCR negative Discontinued vancomycin  azithromycin , continue cefepime  for now Blood culture growing Serratia MRSA sensitive, sensitive reports pending     # DMII uncontrolled with hyperglycemia - AG13>>10 improved  -fs 394 >>93 improved Started Semglee  14 units nightly, NovoLog  sliding scale Monitor CBG, continue diabetic diet    # Isotonic hyponatremia, most likely nutritional deficiency Serum osmolality 280 WNL -gently ivfs  Monitor BMP Na 129 >>133    # CHF combined systolic diastolic  - no acute exacerbation , appear hypovolemic  - holding diuretic currently  -resume cardiac GDMT in am as able as bp and volume status allows    # PAD -s/p PTA -resume asa/plavix     #  HTN  8/15 low blood pressure.  DC'd Coreg  Started midodrine  10 mg p.o. 3 times daily with holding parameters 8/16 BP improved, decrease midodrine  5 mg p.o. 3 times daily with holding parameters Monitor BP and titrate medication accordingly   # AKI due to hypotension versus contrast. Resolved Continue IV fluid for hydration Started Mucomyst  600 mg p.o. twice daily for 2 days Monitor renal functions and urine output Check bladder scan Avoid nephrotoxic  medications and use renally dose medications sCr 1.25>>1.04 and Co2 21  # Metabolic acidosis due to AKI 8/15  bicarbonate 100 mEq x 1 dose given  # Hyperkalemia secondary to AKI. Resolved  Lokelma  10 g x 2 doses given Monitor BMP daily   # OSA -02 at bedtime    # DJD of the spine -supportive care    # Hx of ETOH abuse -placed on ciwa   # Constipation: started laxatives 8/17 Dulcolax suppository x 1 dose given 8/18 constipation resolved, patient did move bowels after suppository, did not require enema.  Body mass index is 27.37 kg/m.  Interventions:  Diet: Carb modified diet, fluid striction 1.5 L/day DVT Prophylaxis: Heparin  IV infusion   Advance goals of care discussion: Full code  Family Communication: family was present at bedside, at the time of interview.  The pt provided permission to discuss medical plan with the family. Opportunity was given to ask question and all questions were answered satisfactorily.   Disposition:  Pt is from Home, admitted with sepsis, pneumonia, right femoral hematoma, non-STEMI, still very sick, which precludes a safe discharge. Discharge to home versus SNF, TBD, when stable, may need few days to improve.  Subjective: No significant events overnight.  Patient did move bowels yesterday x 2, after suppository.  Did not require enema.  Patient is feeling a lot better now, denied any active issues.   Physical Exam: General: NAD, lying comfortably Appear in no distress, affect appropriate Eyes: PERRLA ENT: Oral Mucosa Clear, moist  Neck: no JVD,  Cardiovascular: S1 and S2 Present, no Murmur,  Respiratory: good respiratory effort, Bilateral Air entry equal and Decreased, no Crackles, no wheezes Abdomen: Bowel Sound present, Soft and no tenderness,  Skin: no rashes Extremities: s/p right femoral repair, dressing intact S/p right BKA. LLE mild edema and pressure ulcers, no calf tenderness Neurologic: without any new focal findings Gait  not checked due to patient safety concerns  Vitals:   12/07/23 1646 12/07/23 1957 12/08/23 0328 12/08/23 0744  BP: (!) 148/88 131/82 125/66 122/76  Pulse: 82 84 75 81  Resp: 17 16 18 18   Temp: 98.8 F (37.1 C) 97.8 F (36.6 C) 98 F (36.7 C) 97.6 F (36.4 C)  TempSrc:  Oral    SpO2: 99% 100% 100% 93%  Weight:      Height:        Intake/Output Summary (Last 24 hours) at 12/08/2023 1616 Last data filed at 12/08/2023 1300 Gross per 24 hour  Intake 761.23 ml  Output 800 ml  Net -38.77 ml   Filed Weights   12/04/23 1138  Weight: 81.6 kg    Data Reviewed: I have personally reviewed and interpreted daily labs, tele strips, imagings as discussed above. I reviewed all nursing notes, pharmacy notes, vitals, pertinent old records I have discussed plan of care as described above with RN and patient/family.  CBC: Recent Labs  Lab 12/04/23 1141 12/04/23 2147 12/05/23 0519 12/06/23 0611 12/07/23 0538 12/08/23 0553  WBC 11.6* 11.0* 14.5* 10.3 8.2 7.4  NEUTROABS 10.5*  --   --   --   --   --  HGB 14.3 13.3 11.5* 11.7* 11.3* 11.7*  HCT 42.9 38.3* 34.2* 34.1* 32.7* 35.1*  MCV 84.6 82.2 85.5 83.8 83.0 86.0  PLT 191 157 152 161 168 213   Basic Metabolic Panel: Recent Labs  Lab 12/05/23 1235 12/05/23 1643 12/06/23 0611 12/07/23 0538 12/08/23 0553  NA 130* 130* 129* 133* 134*  K 4.5 4.6 4.0 4.1 4.0  CL 98 101 99 101 101  CO2 21* 19* 21* 21* 25  GLUCOSE 143* 87 149* 138* 82  BUN 31* 30* 30* 29* 27*  CREATININE 1.45* 1.35* 1.25* 1.04 1.02  CALCIUM  8.0* 8.3* 7.9* 8.3* 8.6*  MG  --   --  1.9 2.0 2.6*  PHOS  --   --  2.8 2.7 2.3*    Studies: No results found.   Scheduled Meds:  aspirin  EC  81 mg Oral Daily   atorvastatin   80 mg Oral QHS   bisacodyl   10 mg Oral QHS   Chlorhexidine  Gluconate Cloth  6 each Topical Daily   clopidogrel   75 mg Oral Daily   folic acid   1 mg Oral Daily   gabapentin   100 mg Oral QHS   insulin  aspart  0-15 Units Subcutaneous Q4H   insulin   glargine-yfgn  14 Units Subcutaneous Q2200   melatonin  5 mg Oral QHS   multivitamin with minerals  1 tablet Oral Daily   polyethylene glycol  17 g Oral BID   sodium chloride  flush  3 mL Intravenous Q12H   thiamine   100 mg Oral Daily   Or   thiamine   100 mg Intravenous Daily   Continuous Infusions:  cefTRIAXone  (ROCEPHIN )  IV 200 mL/hr at 12/07/23 1717   heparin  2,050 Units/hr (12/08/23 1609)   PRN Meds: acetaminophen , albuterol , alum & mag hydroxide-simeth, artificial tears, bisacodyl , dextrose , fluticasone , morphine  injection, ondansetron  (ZOFRAN ) IV, oxyCODONE , sodium chloride  flush, traZODone   Time spent: 40 minutes  Author: ELVAN SOR. MD [facility] 12/08/2023 4:16 PM  To reach On-call, see care teams to locate the attending and reach out to them via [email or web address]. If 7PM-7AM, please contact night-coverage If you still have difficulty reaching the attending provider, please page the [facility] (Director on Call) for [facility] on amion for assistance.

## 2023-12-08 NOTE — Evaluation (Signed)
 Physical Therapy Evaluation Patient Details Name: Manuel [name] MRN: [id number] DOB: September 24, 1945 Today's Date: 12/08/2023  History of Present Illness  78 y.o. 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 Patient fell with walker landing on top of him.  EMS was called for assistance.  Clinical Impression  Secure chat with vascular MD and pt is cleared for limited mobility. Pt is pending procedure. Likely will need to be re-evaluated after surgery. Discussed with family to bring R prosthesis for continued mobility once medically stable. Pt is a pleasant 78 year old male who was admitted after fall resulting in R femoral artery rupture. Pt PLOF is mod I and uses wc for mobility. Pt received a prosthetic leg 2-3 months ago and is able to ambulate using crutches at short distances. Pt performs bed mobility and transfers with supervision-CGA for safety requiring min verbal cuing for STS only. Pt demonstrates deficits with strength and functional mobility. Would benefit from skilled PT to address above deficits and promote optimal return to PLOF.          If plan is discharge home, recommend the following: A little help with walking and/or transfers;A little help with bathing/dressing/bathroom;Assistance with cooking/housework   Can travel by private vehicle        Equipment Recommendations None recommended by PT  Recommendations for Other Services       Functional Status Assessment Patient has had a recent decline in their functional status and demonstrates the ability to make significant improvements in function in a reasonable and predictable amount of time.     Precautions /  Restrictions Precautions Precautions: Fall Recall of Precautions/Restrictions: Intact Restrictions Weight Bearing Restrictions Per Provider Order: No      Mobility  Bed Mobility Overal bed mobility: Needs Assistance Bed Mobility: Supine to Sit     Supine to sit: Supervision     General bed mobility comments: Pt able to perform bed mobility with supervision for safety and line management.    Transfers Overall transfer level: Needs assistance Equipment used: Rolling walker (2 wheels) Transfers: Bed to chair/wheelchair/BSC, Sit to/from Stand Sit to Stand: Contact guard assist Stand pivot transfers: Contact guard assist         General transfer comment: CGA for safety. Pt able to perform transfer safely using shuffle/pivot technique.    Ambulation/Gait               General Gait Details: NT. Pt is pending procedure and per MD order, limited activity this session. Pt has R BKE and does not have prothesis at hospital  Stairs            Wheelchair Mobility     Tilt Bed    Modified Rankin (Stroke Patients Only)       Balance Overall balance assessment: Needs assistance Sitting-balance support: Feet supported Sitting balance-Leahy Scale: Good Sitting balance - Comments: Pt able to maintain seated balance with support for external surfaces   Standing balance support: Bilateral upper extremity supported, During functional activity, Reliant on assistive device for balance Standing balance-Leahy Scale: Fair Standing balance comment: Pt able to maintain standing balance while using RW. CGA for  safety. Verbal cues for hand placement                             Pertinent Vitals/Pain Pain Assessment Pain Assessment: No/denies pain    Home Living Family/patient expects to be discharged to:: Private residence Living Arrangements: Spouse/significant other Available Help at Discharge: Family;Available 24 hours/day Type of Home: House Home Access:  Ramped entrance     Alternate Level Stairs-Number of Steps: 15 Home Layout: Able to live on main level with bedroom/bathroom;Two level;Laundry or work area in [employer] Equipment: Agricultural consultant (2 wheels);BSC/3in1;Wheelchair - manual;Cane - single point;Tub bench;Crutches Additional Comments: Pt reports that he has a prosthesis    Prior Function Prior Level of Function : Independent/Modified Independent             Mobility Comments: Pt reports he transfers confidently to/from w/c and uses prosthesis with crutches for short distances. Recent fall during transfer resutling in hospitalization. ADLs Comments: Pt reports that he is indepenent with ADLs .     Extremity/Trunk Assessment   Upper Extremity Assessment Upper Extremity Assessment: Overall WFL for tasks assessed    Lower Extremity Assessment Lower Extremity Assessment: Overall WFL for tasks assessed;RLE deficits/detail RLE Deficits / Details: R BKA    Cervical / Trunk Assessment Cervical / Trunk Assessment: Normal  Communication   Communication Communication: Impaired Factors Affecting Communication: Hearing impaired    Cognition Arousal: Alert Behavior During Therapy: WFL for tasks assessed/performed   PT - Cognitive impairments: No apparent impairments, No family/caregiver present to determine baseline                       PT - Cognition Comments: Pt is A&Ox3, not oriented to time. Following commands: Intact       Cueing Cueing Techniques: Verbal cues     General Comments      Exercises     Assessment/Plan    PT Assessment Patient needs continued PT services  PT Problem List Decreased strength;Decreased balance;Decreased mobility       PT Treatment Interventions Functional mobility training;Therapeutic activities;Therapeutic exercise;Neuromuscular re-education;Patient/family education    PT Goals (Current goals can be found in the Care Plan section)  Acute Rehab PT  Goals Patient Stated Goal: To go home PT Goal Formulation: With patient Time For Goal Achievement: 12/22/23 Potential to Achieve Goals: Good    Frequency Min 1X/week     Co-evaluation               AM-PAC PT 6 Clicks Mobility  Outcome Measure Help needed turning from your back to your side while in a flat bed without using bedrails?: A Little Help needed moving from lying on your back to sitting on the side of a flat bed without using bedrails?: A Little Help needed moving to and from a bed to a chair (including a wheelchair)?: A Little Help needed standing up from a chair using your arms (e.g., wheelchair or bedside chair)?: A Little Help needed to walk in hospital room?: A Little Help needed climbing 3-5 steps with a railing? : A Little 6 Click Score: 18    End of Session Equipment Utilized During Treatment: Gait belt Activity Tolerance: Patient tolerated treatment well Patient left: in chair;with chair alarm set;with family/visitor present;with call bell/phone within reach Nurse Communication: Mobility status PT Visit Diagnosis: Difficulty in walking, not elsewhere classified (R26.2);Muscle weakness (generalized) (M62.81)    Time: 8550-8489 PT Time Calculation (  min) (ACUTE ONLY): 21 min   Charges:   PT Evaluation $PT Eval Low Complexity: 1 Low PT Treatments $Therapeutic Activity: 8-22 mins PT General Charges $$ ACUTE PT VISIT: 1 Visit         Luvenia Avers, SPT   Demontez Novack 12/08/2023, 5:38 PM

## 2023-12-08 NOTE — Progress Notes (Signed)
 Heart Failure Navigator Progress Note  Education Assessment and Provision:  Detailed education and instructions provided on heart failure disease management including the following:  Signs and symptoms of Heart Failure When to call the physician Importance of daily weights Low sodium diet Fluid restriction Medication management Anticipated future follow-up appointments  Patient education given on each of the above topics.  Patient acknowledges understanding via teach back method and acceptance of all instructions.  Patient already established with Ellouise Class, FNP. Scheduled for [facility] Clinic 12/15/23 @ 3:00 PM following this hospital admission.  Navigator will sign off at this time.  Charmaine Pines, RN, BSN [facility] Heart Failure Navigator Secure Chat Only

## 2023-12-09 DIAGNOSIS — R7881 Bacteremia: Secondary | ICD-10-CM

## 2023-12-09 DIAGNOSIS — T827XXA Infection and inflammatory reaction due to other cardiac and vascular devices, implants and grafts, initial encounter: Secondary | ICD-10-CM

## 2023-12-09 DIAGNOSIS — I70203 Unspecified atherosclerosis of native arteries of extremities, bilateral legs: Secondary | ICD-10-CM | POA: Diagnosis not present

## 2023-12-09 DIAGNOSIS — A498 Other bacterial infections of unspecified site: Secondary | ICD-10-CM

## 2023-12-09 DIAGNOSIS — S75091A Other specified injury of femoral artery, right leg, initial encounter: Secondary | ICD-10-CM | POA: Diagnosis not present

## 2023-12-09 DIAGNOSIS — I772 Rupture of artery: Secondary | ICD-10-CM | POA: Diagnosis not present

## 2023-12-09 DIAGNOSIS — R600 Localized edema: Secondary | ICD-10-CM

## 2023-12-09 DIAGNOSIS — I2510 Atherosclerotic heart disease of native coronary artery without angina pectoris: Secondary | ICD-10-CM | POA: Diagnosis not present

## 2023-12-09 LAB — CBC
HCT: 31.5 % — ABNORMAL LOW (ref 39.0–52.0)
Hemoglobin: 10.9 g/dL — ABNORMAL LOW (ref 13.0–17.0)
MCH: 28.8 pg (ref 26.0–34.0)
MCHC: 34.6 g/dL (ref 30.0–36.0)
MCV: 83.3 fL (ref 80.0–100.0)
Platelets: 211 K/uL (ref 150–400)
RBC: 3.78 MIL/uL — ABNORMAL LOW (ref 4.22–5.81)
RDW: 15.9 % — ABNORMAL HIGH (ref 11.5–15.5)
WBC: 5.6 K/uL (ref 4.0–10.5)
nRBC: 0 % (ref 0.0–0.2)

## 2023-12-09 LAB — BASIC METABOLIC PANEL WITH GFR
Anion gap: 8 (ref 5–15)
BUN: 22 mg/dL (ref 8–23)
CO2: 24 mmol/L (ref 22–32)
Calcium: 8.3 mg/dL — ABNORMAL LOW (ref 8.9–10.3)
Chloride: 104 mmol/L (ref 98–111)
Creatinine, Ser: 1.01 mg/dL (ref 0.61–1.24)
GFR, Estimated: >60 mL/min (ref >60)
Glucose, Bld: 86 mg/dL (ref 70–99)
Potassium: 3.5 mmol/L (ref 3.5–5.1)
Sodium: 136 mmol/L (ref 135–145)

## 2023-12-09 LAB — GLUCOSE, CAPILLARY
Glucose-Capillary: 102 mg/dL — ABNORMAL HIGH (ref 70–99)
Glucose-Capillary: 130 mg/dL — ABNORMAL HIGH (ref 70–99)
Glucose-Capillary: 131 mg/dL — ABNORMAL HIGH (ref 70–99)
Glucose-Capillary: 144 mg/dL — ABNORMAL HIGH (ref 70–99)
Glucose-Capillary: 146 mg/dL — ABNORMAL HIGH (ref 70–99)
Glucose-Capillary: 72 mg/dL (ref 70–99)

## 2023-12-09 LAB — HEPARIN LEVEL (UNFRACTIONATED): Heparin Unfractionated: 0.51 [IU]/mL (ref 0.30–0.70)

## 2023-12-09 MED ORDER — ORAL CARE MOUTH RINSE: 15.0000 mL | OROMUCOSAL | Status: DC | PRN

## 2023-12-09 MED ORDER — GUAIFENESIN-DM 100-10 MG/5ML PO SYRP
5.0000 mL | ORAL_SOLUTION | ORAL | Status: DC | PRN
  Administered 2023-12-09 – 2023-12-10 (×4): 5 mL via ORAL
  Filled 2023-12-09 (×4): qty 10

## 2023-12-09 MED ORDER — CARVEDILOL 6.25 MG PO TABS
6.2500 mg | ORAL_TABLET | Freq: Two times a day (BID) | ORAL | Status: DC
  Administered 2023-12-09 – 2023-12-10 (×3): 6.25 mg via ORAL
  Filled 2023-12-09 (×3): qty 1

## 2023-12-09 MED ORDER — LORATADINE 10 MG PO TABS
10.0000 mg | ORAL_TABLET | Freq: Every day | ORAL | Status: DC | PRN
  Administered 2023-12-09 – 2023-12-16 (×4): 10 mg via ORAL
  Filled 2023-12-09 (×4): qty 1

## 2023-12-09 MED ORDER — SODIUM CHLORIDE 0.9 % IV SOLN
2.0000 g | Freq: Two times a day (BID) | INTRAVENOUS | Status: DC
  Administered 2023-12-09 (×2): 2 g via INTRAVENOUS
  Filled 2023-12-09 (×4): qty 12.5

## 2023-12-09 NOTE — Plan of Care (Signed)

## 2023-12-09 NOTE — Progress Notes (Signed)
 [facility]  Daily Progress Note   Subjective  - 5 Days Post-Op  Patient sitting up at the bedside when I enter his wife is also with him.  He states he is feeling a little better but still feels wiped out.  He was unaware of the positive blood cultures for Serratia.  Objective Vitals:   12/08/23 2003 12/09/23 0408 12/09/23 0755 12/09/23 1518  BP: 136/88 (!) 146/84 138/76 (!) 167/103  Pulse: 85 75 82 89  Resp: 16 18 16 20   Temp: 98.4 F (36.9 C) 98 F (36.7 C) 98.2 F (36.8 C) 98.1 F (36.7 C)  TempSrc:   Oral Oral  SpO2: 93% 97% 96% 100%  Weight:      Height:        Intake/Output Summary (Last 24 hours) at 12/09/2023 1852 Last data filed at 12/09/2023 1600 Gross per 24 hour  Intake 1289.02 ml  Output 550 ml  Net 739.02 ml    PULM  Normal effort , no use of accessory muscles CV  No JVD, RRR Abd      No distended, nontender VASC  ecchymoses with hematoma  Laboratory CBC    Component Value Date/Time   WBC 5.6 12/09/2023 0523   HGB 10.9 (L) 12/09/2023 0523   HGB 15.7 02/16/2012 2013   HCT 31.5 (L) 12/09/2023 0523   HCT 44.4 02/16/2012 2013   PLT 211 12/09/2023 0523   PLT 176 02/16/2012 2013    BMET    Component Value Date/Time   NA 136 12/09/2023 0523   NA 137 07/21/2023 1246   NA 137 02/16/2012 2013   K 3.5 12/09/2023 0523   K 3.8 02/16/2012 2013   CL 104 12/09/2023 0523   CL 102 02/16/2012 2013   CO2 24 12/09/2023 0523   CO2 25 02/16/2012 2013   GLUCOSE 86 12/09/2023 0523   GLUCOSE 154 (H) 02/16/2012 2013   BUN 22 12/09/2023 0523   BUN 30 (H) 07/21/2023 1246   BUN 10 02/16/2012 2013   CREATININE 1.01 12/09/2023 0523   CREATININE 0.72 02/16/2012 2013   CALCIUM  8.3 (L) 12/09/2023 0523   CALCIUM  9.6 02/16/2012 2013   GFRNONAA >60 12/09/2023 0523   GFRNONAA >60 02/16/2012 2013   GFRAA >60 12/09/2018 0929   GFRAA >60 02/16/2012 2013    Assessment/Planning: POD # 5 s/p repair of ruptured femoral artery with Viabahn  stents  I discussed with the patient the plan which is as follows.  Given that he has a prosthetic patch associated with a rupture and positive blood cultures for Serratia it is likely that the patch is infected, it is also likely that the hematoma itself is infected.  I discussed with both the patient and his wife the plan will be to washout all of the hematoma that we can remove the bovine pericardial patch as well as both stents and patch the femoral artery with vein.  I then discussed rotating the sartorius muscle flap for coverage and placing a VAC wound dressing.  Patient is stable at this time and we will plan for Thursday.  The risks and benefits as well as alternatives were reviewed all questions have been answered patient agrees to proceed with surgery.   Cordella Shawl  12/09/2023, 6:52 PM

## 2023-12-09 NOTE — Progress Notes (Signed)
 Date of Admission:  12/04/2023      ID: Manuel [name] is a 78 y.o. male  Principal Problem:   Rupture of femoral artery (HCC) Active Problems:   Ischemic cardiomyopathy   Coronary artery disease involving native coronary artery of native heart    Subjective: Pt sitting in bed and chatting with people Doing better  Medications:   aspirin  EC  81 mg Oral Daily   atorvastatin   80 mg Oral QHS   bisacodyl   10 mg Oral QHS   Chlorhexidine  Gluconate Cloth  6 each Topical Daily   clopidogrel   75 mg Oral Daily   folic acid   1 mg Oral Daily   gabapentin   100 mg Oral QHS   insulin  aspart  0-15 Units Subcutaneous Q4H   insulin  glargine-yfgn  14 Units Subcutaneous Q2200   melatonin  5 mg Oral QHS   multivitamin with minerals  1 tablet Oral Daily   polyethylene glycol  17 g Oral BID   sodium chloride  flush  3 mL Intravenous Q12H   thiamine   100 mg Oral Daily   Or   thiamine   100 mg Intravenous Daily   trimethoprim -polymyxin b   1 drop Both Eyes Q6H    Objective: Vital signs in last 24 hours: Patient Vitals for the past 24 hrs:  BP Temp Temp src Pulse Resp SpO2  12/09/23 0755 138/76 98.2 F (36.8 C) Oral 82 16 96 %  12/09/23 0408 (!) 146/84 98 F (36.7 C) -- 75 18 97 %  12/08/23 2003 136/88 98.4 F (36.9 C) -- 85 16 93 %  12/08/23 1644 (!) 145/89 97.9 F (36.6 C) -- 80 18 98 %     Lines and Device Date on insertion # of days DC  Engineer, technical sales     ETT       PHYSICAL EXAM:  General: Alert, cooperative, no distress, appears stated age.  Lungs: Clear to auscultation bilaterally. No Wheezing or Rhonchi. No rales. Heart: Regular rate and rhythm, no murmur, rub or gallop. Extremities: rt thigh dressing over groin Left leg erythema and edema better Neurologic: Grossly non-focal  Lab Results    Latest Ref Rng & Units 12/09/2023    5:23 AM 12/08/2023    5:53 AM 12/07/2023    5:38 AM  CBC  WBC 4.0 - 10.5 K/uL 5.6  7.4  8.2   Hemoglobin 13.0 -  17.0 g/dL 89.0  88.2  88.6   Hematocrit 39.0 - 52.0 % 31.5  35.1  32.7   Platelets 150 - 400 K/uL 211  213  168        Latest Ref Rng & Units 12/09/2023    5:23 AM 12/08/2023    5:53 AM 12/07/2023    5:38 AM  CMP  Glucose 70 - 99 mg/dL 86  82  861   BUN 8 - 23 mg/dL 22  27  29    Creatinine 0.61 - 1.24 mg/dL 8.98  8.97  8.95   Sodium 135 - 145 mmol/L 136  134  133   Potassium 3.5 - 5.1 mmol/L 3.5  4.0  4.1   Chloride 98 - 111 mmol/L 104  101  101   CO2 22 - 32 mmol/L 24  25  21    Calcium  8.9 - 10.3 mg/dL 8.3  8.6  8.3   Total Protein 6.5 - 8.1 g/dL  6.2  5.6   Total Bilirubin 0.0 - 1.2 mg/dL  0.7  0.9  Alkaline Phos 38 - 126 U/L  76  73   AST 15 - 41 U/L  25  33   ALT 0 - 44 U/L  22  26       Microbiology: 12/04/23 BC X 2 serratia 11/28/23 BC- NG  so far    Assessment/Plan:  Right common femoral artery graft with rupture and leaking into the cutaneous tissue forming a hematoma.  Patient underwent angio and had stent placement  Will be taken for surgery on Thursday for evacuation of the hematoma and possible removal of the graft and cultures will be sent.  I discussed with Dr. dreama  Serratia bacteremia.  Source is likely the right groin bovine graft.  Patient is currently on ceftriaxone .  There is Serratia from the wound in October 2024 was resistant to ceftriaxone  but the blood culture is currently susceptible to it. Ceftriaxone  has been changed to cefepime .  Repeat blood culture has been sent   History of bilateral endarterectomies of the right And left common femoral artery, profunda artery and superficial femoral artery, Fogarty embolectomy of right SFA, stent placement bilateral common iliac arteries, left external artery and right PTA on 11/15/2022.  Also had bovine pericardial patch angioplasties.  This was complicated by wound dehiscence on the right groin in September 2024.  Underwent debridement superficial culture from the right groin in October 2024 was Serratia along  with Staphylococcus aureus and he received 14 days of appropriate antibiotics.  At that time the endovascular graft infection was not a concern  Right BKA  Left leg edema with superficial wounds and possible cellulitis He has to keep the leg elevated when he is sitting.    CAD Diabetes mellitus  Discussed the management with the patient.

## 2023-12-09 NOTE — Progress Notes (Signed)
 [facility] Progress Note  Patient: Manuel [name]    FMW:[id number]  DOA: 12/04/2023     Date of Service: the patient was seen and examined on 12/09/2023  Chief Complaint  Patient presents with   Weakness   Brief hospital course: Manuel [name] is a 78 y.o. 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 Patient fell with walker landing on top of him.  EMS was called for assistance.  Patient refused to be transported to ED for evaluation initial. However as time when on patient note more intense pain in right groin area and due to his EMS was called back and patient was transported to ED.      ED Course:  99.3/tmx 100.7 BP 131/72, hr 113, rr 19 sat 95%  UA :rare bacteria , ketones 20 ,  Wbc 11.6, hgb 14.3, plt 191, increase pmn Na 126 ( 137), K 5.3, cl91, glu 438, cr 1.12 Lactic 2.9 ,3.3 Trop 284,3403, 8119 EKG: sinus tachycardia , RAD twave change inferior lateral leads  Patient on evaluation found to have right femoral a rupture, vascular consulted and performed repair.     Patient course was further complicated by NTSEMI , s/p right femoral a repair patient was started on heparin  drip . Cardiology was consulted and will see patient in am , but plans for intervention  recommend medical management.   PCCM was involved for comanagement. [facility] was consulted for admission and further management as below.   Assessment and Plan:  # Right femoral artery Rupture s/p repair S/p right EIA/CFA stenting, PTA of PFA -follow vascular recs  8/17 Increased opening of skin and subcutaneous tissue from prior hematoma.    Patient Will need operative hematoma evacuation/I&D and  closure over the next day or so.  Follow-up with vascular surgery for further recommendation and DC planning ID consulted, patient had prior infection with same bacteria, possible endovascular infection.  As per ID continue cefepime  for now, patient may need 4 to 6 weeks of IV antibiotics.   # NSTEMI -CAD s/p pic RCA c/b NSTEMI 06/2023 s/p stent x 3 to LAD Continue aspirin  and statin 8/15 started Plavix  continue heparin  drip  -TTE LVEF 20 to 25%, severely decreased function and global hypokinesis. Grade 1 DD.  RV systolic function is severely reduced. -Troponin peaked 8119 >> 7607 - Cardiology following  # Bi-Ventricular systolic failure and diastolic dysfunction  TTE as above -Appears euvolemic -No GDMT due to low normal BP. -Restart GDMT when BP permits Follow cardiology 8/19 blood pressure improved today, started Coreg  6.25 mg p.o. twice daily   # CAP with Sepsis, Serratia bacteremia -  Opacities on chest imaging /fever/mild leukocytosis/lactic acidosis -s/p Vanco azithromycin  and cefepime  MRSA PCR negative Discontinued vancomycin  azithromycin , continue cefepime  for now Blood culture growing Serratia, marcescens, pansensitive.      # DMII uncontrolled with hyperglycemia - AG13>>10 improved  -fs 394 >>93 improved Started Semglee  14 units nightly, NovoLog  sliding scale Monitor CBG, continue diabetic diet    # Isotonic hyponatremia, most likely nutritional deficiency Serum osmolality 280 WNL -gently ivfs  Monitor BMP Na 129 >>133    # CHF combined  systolic diastolic  - no acute exacerbation , appear hypovolemic  - holding diuretic currently  -resume cardiac GDMT in am as able as bp and volume status allows    # PAD -s/p PTA -resume asa/plavix     # HTN  8/15 low blood pressure.  DC'd Coreg  Started midodrine  10 mg p.o. 3 times daily with holding parameters 8/16 BP improved, decrease midodrine  5 mg p.o. 3 times daily with holding parameters Monitor BP and  titrate medication accordingly   # AKI due to hypotension versus contrast. Resolved Continue IV fluid for hydration Started Mucomyst  600 mg p.o. twice daily for 2 days Monitor renal functions and urine output Check bladder scan Avoid nephrotoxic medications and use renally dose medications sCr 1.25>>1.01 and Co2 24, stable and resolved  # Metabolic acidosis due to AKI.  Resolved 8/15  bicarbonate 100 mEq x 1 dose given  # Hyperkalemia secondary to AKI. Resolved  Lokelma  10 g x 2 doses given Monitor BMP daily   # OSA -02 at bedtime    # DJD of the spine -supportive care    # Hx of ETOH abuse -placed on ciwa   # Constipation: started laxatives 8/17 Dulcolax suppository x 1 dose given 8/18 constipation resolved, patient did move bowels after suppository, did not require enema.  Body mass index is 27.37 kg/m.  Interventions:  Diet: Carb modified diet, fluid striction 1.5 L/day DVT Prophylaxis: Heparin  IV infusion   Advance goals of care discussion: Full code  Family Communication: family was present at bedside, at the time of interview.  The pt provided permission to discuss medical plan with the family. Opportunity was given to ask question and all questions were answered satisfactorily.   Disposition:  Pt is from Home, admitted with sepsis, pneumonia, right femoral hematoma, non-STEMI, still very sick, which precludes a safe discharge. Discharge to home versus SNF, TBD, when stable, may need few days to improve.  Subjective: No significant events overnight.  Patient did move bowels yesterday x 2, after suppository.  Did not require enema.  Patient is feeling a lot better now, denied any active issues.   Physical Exam: General: NAD, lying comfortably Appear in no distress, affect appropriate Eyes: PERRLA ENT: Oral Mucosa Clear, moist  Neck: no JVD,  Cardiovascular: S1 and S2 Present, no Murmur,  Respiratory: good respiratory effort, Bilateral Air entry equal and  Decreased, no Crackles, no wheezes Abdomen: Bowel Sound present, Soft and no tenderness,  Skin: no rashes Extremities: s/p right femoral repair, dressing intact S/p right BKA. LLE mild edema and pressure ulcers, no calf tenderness Neurologic: without any new focal findings Gait not checked due to patient safety concerns  Vitals:   12/08/23 2003 12/09/23 0408 12/09/23 0755 12/09/23 1518  BP: 136/88 (!) 146/84 138/76 (!) 167/103  Pulse: 85 75 82 89  Resp: 16 18 16 20   Temp: 98.4 F (36.9 C) 98 F (36.7 C) 98.2 F (36.8 C) 98.1 F (36.7 C)  TempSrc:   Oral Oral  SpO2: 93% 97% 96% 100%  Weight:      Height:        Intake/Output Summary (Last 24 hours) at 12/09/2023 1701 Last data filed at 12/09/2023 1600 Gross per 24 hour  Intake 1289.02 ml  Output 550 ml  Net 739.02 ml   Filed Weights   12/04/23 1138  Weight: 81.6 kg    Data Reviewed: I have personally reviewed and interpreted daily labs, tele strips, imagings as discussed above. I reviewed all nursing notes,  pharmacy notes, vitals, pertinent old records I have discussed plan of care as described above with RN and patient/family.  CBC: Recent Labs  Lab 12/04/23 1141 12/04/23 2147 12/05/23 0519 12/06/23 0611 12/07/23 0538 12/08/23 0553 12/09/23 0523  WBC 11.6*   < > 14.5* 10.3 8.2 7.4 5.6  NEUTROABS 10.5*  --   --   --   --   --   --   HGB 14.3   < > 11.5* 11.7* 11.3* 11.7* 10.9*  HCT 42.9   < > 34.2* 34.1* 32.7* 35.1* 31.5*  MCV 84.6   < > 85.5 83.8 83.0 86.0 83.3  PLT 191   < > 152 161 168 213 211   < > = values in this interval not displayed.   Basic Metabolic Panel: Recent Labs  Lab 12/05/23 1643 12/06/23 0611 12/07/23 0538 12/08/23 0553 12/09/23 0523  NA 130* 129* 133* 134* 136  K 4.6 4.0 4.1 4.0 3.5  CL 101 99 101 101 104  CO2 19* 21* 21* 25 24  GLUCOSE 87 149* 138* 82 86  BUN 30* 30* 29* 27* 22  CREATININE 1.35* 1.25* 1.04 1.02 1.01  CALCIUM  8.3* 7.9* 8.3* 8.6* 8.3*  MG  --  1.9 2.0 2.6*  --    PHOS  --  2.8 2.7 2.3*  --     Studies: No results found.   Scheduled Meds:  aspirin  EC  81 mg Oral Daily   atorvastatin   80 mg Oral QHS   bisacodyl   10 mg Oral QHS   Chlorhexidine  Gluconate Cloth  6 each Topical Daily   clopidogrel   75 mg Oral Daily   folic acid   1 mg Oral Daily   gabapentin   100 mg Oral QHS   insulin  aspart  0-15 Units Subcutaneous Q4H   insulin  glargine-yfgn  14 Units Subcutaneous Q2200   melatonin  5 mg Oral QHS   multivitamin with minerals  1 tablet Oral Daily   polyethylene glycol  17 g Oral BID   sodium chloride  flush  3 mL Intravenous Q12H   thiamine   100 mg Oral Daily   Or   thiamine   100 mg Intravenous Daily   trimethoprim -polymyxin b   1 drop Both Eyes Q6H   Continuous Infusions:  ceFEPime  (MAXIPIME ) IV 2 g (12/09/23 1007)   heparin  2,050 Units/hr (12/09/23 0534)   PRN Meds: acetaminophen , albuterol , alum & mag hydroxide-simeth, artificial tears, bisacodyl , dextrose , fluticasone , guaiFENesin -dextromethorphan , morphine  injection, ondansetron  (ZOFRAN ) IV, mouth rinse, oxyCODONE , sodium chloride  flush, traZODone   Time spent: 40 minutes  Author: ELVAN SOR. MD [facility] 12/09/2023 5:01 PM  To reach On-call, see care teams to locate the attending and reach out to them via [email or web address]. If 7PM-7AM, please contact night-coverage If you still have difficulty reaching the attending provider, please page the [facility] (Director on Call) for [facility] on amion for assistance.

## 2023-12-09 NOTE — Consult Note (Signed)
 PHARMACY - ANTICOAGULATION CONSULT NOTE  Pharmacy Consult for IV Heparin  Indication: chest pain/ACS  Patient Measurements: Height: 5' 8 (172.7 cm) Weight: 81.6 kg (180 lb) IBW/kg (Calculated) : 68.4 HEPARIN  DW (KG): 81.6  Labs: Recent Labs    12/07/23 0538 12/07/23 1233 12/07/23 1951 12/08/23 0553 12/09/23 0523  HGB 11.3*  --   --  11.7* 10.9*  HCT 32.7*  --   --  35.1* 31.5*  PLT 168  --   --  213 211  HEPARINUNFRC 0.27*   < > 0.42 0.50 0.51  CREATININE 1.04  --   --  1.02  --    < > = values in this interval not displayed.    Estimated Creatinine Clearance: 55.9 mL/min (by C-G formula based on SCr of 1.02 mg/dL).   Medical History: Past Medical History:  Diagnosis Date   Acute ST elevation myocardial infarction (STEMI) of inferior wall (HCC) 04/19/2010   a.) transfered from [facility] to [facility] --> LHC/PCI (very difficult procedure) --> 3.0 x 23 mm and 3.0 x 12 mm Xience stents to RCA   Allergies    Arthritis    Benign essential hypertension    Bilateral carotid artery disease (HCC) 05/08/2021   a.) carotid doppler 05/08/2021: 1-39% BICA   CAD (coronary artery disease) 04/19/2010   a.) inferior STEMI 04/19/2010 --> LHC/PCI: 50-70% pD1, 80% pRI, 90/90/90% RCA (overlapping 3.0 x 23 and 3.0 x 12 mm Xience DES); b.) MV 11/10/2018: fixed minimally reversible inferior perfusion defect   Cellulitis of foot    Chronic HFrEF (heart failure with reduced ejection fraction) (HCC)    DDD (degenerative disc disease), cervical    Diabetes mellitus type 2, insulin  dependent (HCC)    Diverticulosis    Full dentures    Gout    Hard of hearing    History of bilateral cataract extraction 2022   History of ETOH abuse    Hyperlipidemia    Ischemic cardiomyopathy 04/19/2010   a.) TTE 04/19/2010: 40%; b.) TTE 04/20/2014: EF >55%; c.) TTE 11/10/2018: EF 45%; d.) TTE 11/14/2022: EF 25-30%; e. 06/2023 Echo: EF 25-30%, nl RV size/fxn. Mild MR. Mild-mod TR. Ao sclerosis w/o stenosis.   Long term  current use of aspirin     Long term current use of clopidogrel     Lumbar degenerative disc disease    Lumbar radiculopathy    Lumbar vertebral fracture (chronic superior endplate of L1)    NSTEMI (non-ST elevated myocardial infarction) (HCC) 01/15/2023   OSA (obstructive sleep apnea)    a.) unable to tolerate nocturnal PAP therapy   Peripheral artery disease (HCC)    a.) stenting 05/14/21: 12 mm x 12 cm LifeStent RIGHT dis SFA/prox pop; b.) s/p cath directed thrombolysis RIGHT SFA/pop 06/14/21; c.) s/p mech thrombectomy + stenting 06/15/21: 8 mm x 25cm & 8 mm x 7.5cm Viabahn; d.) s/p BILAT CFA, profunda femoris, SFA endarterectomies + fogarty embolectomy + stenting 11/15/22: 12mm x 58mm Lifestream BILAT CIAs, 14 mm x 6 cm Lifestream & 13 mm x 5 cm Viabahn LEFT EIA   Peripheral neuropathy    Umbilical hernia     Medications:  No anticoagulation prior to admission per my chart review. Takes ASA / Plavix  at home  Assessment: 78 y/o M with medical history as above and including CAD, PAD presenting to the ED 8/14 with right common femoral artery hematoma with active extravasation. Patient underwent endovascular intervention same day. However, clinical picture complicated by rising troponin (715 >> 6596). Pharmacy consulted to dose  heparin . IV heparin  was stopped this morning for bleeding in groin; vascular surgery now wants to resume  Baseline Labs aPTT 55s and INR 1.3  Baseline H&H, platelets are within normal limits.  8/17:  HL @ 0538 = 0.27, SUBtherapeutic  8/17 1233 HL 0.52,  therapeutic x1 8/17 1951 HL 0.42, therapeutic x2 8/18 0553 HL 0.50, therapeutic X 3 8/19 0523 HL 0.51, therapeutic x 4  Goal of Therapy:  Heparin  level 0.3-0.7 units/ml Monitor platelets by anticoagulation protocol: Yes   Plan:  Continue heparin  infusion at 2050 units/hr. Daily CBC, heparin  level. Monitor for signs/symptoms of bleeding.  Manuel [name], PharmD, [facility] 12/09/2023 6:39 AM

## 2023-12-10 DIAGNOSIS — E119 Type 2 diabetes mellitus without complications: Secondary | ICD-10-CM

## 2023-12-10 DIAGNOSIS — Z79899 Other long term (current) drug therapy: Secondary | ICD-10-CM

## 2023-12-10 DIAGNOSIS — S75091A Other specified injury of femoral artery, right leg, initial encounter: Secondary | ICD-10-CM | POA: Diagnosis not present

## 2023-12-10 DIAGNOSIS — Z95828 Presence of other vascular implants and grafts: Secondary | ICD-10-CM

## 2023-12-10 DIAGNOSIS — I771 Stricture of artery: Secondary | ICD-10-CM

## 2023-12-10 DIAGNOSIS — I70203 Unspecified atherosclerosis of native arteries of extremities, bilateral legs: Secondary | ICD-10-CM

## 2023-12-10 DIAGNOSIS — I772 Rupture of artery: Secondary | ICD-10-CM | POA: Diagnosis not present

## 2023-12-10 DIAGNOSIS — Z89511 Acquired absence of right leg below knee: Secondary | ICD-10-CM

## 2023-12-10 DIAGNOSIS — I2510 Atherosclerotic heart disease of native coronary artery without angina pectoris: Secondary | ICD-10-CM | POA: Diagnosis not present

## 2023-12-10 DIAGNOSIS — Z9889 Other specified postprocedural states: Secondary | ICD-10-CM

## 2023-12-10 LAB — BASIC METABOLIC PANEL WITH GFR
Anion gap: 9 (ref 5–15)
BUN: 18 mg/dL (ref 8–23)
CO2: 22 mmol/L (ref 22–32)
Calcium: 8.4 mg/dL — ABNORMAL LOW (ref 8.9–10.3)
Chloride: 106 mmol/L (ref 98–111)
Creatinine, Ser: 0.92 mg/dL (ref 0.61–1.24)
GFR, Estimated: >60 mL/min (ref >60)
Glucose, Bld: 128 mg/dL — ABNORMAL HIGH (ref 70–99)
Potassium: 4 mmol/L (ref 3.5–5.1)
Sodium: 137 mmol/L (ref 135–145)

## 2023-12-10 LAB — CBC
HCT: 32.8 % — ABNORMAL LOW (ref 39.0–52.0)
Hemoglobin: 11 g/dL — ABNORMAL LOW (ref 13.0–17.0)
MCH: 28.7 pg (ref 26.0–34.0)
MCHC: 33.5 g/dL (ref 30.0–36.0)
MCV: 85.6 fL (ref 80.0–100.0)
Platelets: 248 K/uL (ref 150–400)
RBC: 3.83 MIL/uL — ABNORMAL LOW (ref 4.22–5.81)
RDW: 16 % — ABNORMAL HIGH (ref 11.5–15.5)
WBC: 7.5 K/uL (ref 4.0–10.5)
nRBC: 0 % (ref 0.0–0.2)

## 2023-12-10 LAB — GLUCOSE, CAPILLARY
Glucose-Capillary: 78 mg/dL (ref 70–99)
Glucose-Capillary: 199 mg/dL — ABNORMAL HIGH (ref 70–99)
Glucose-Capillary: 228 mg/dL — ABNORMAL HIGH (ref 70–99)
Glucose-Capillary: 148 mg/dL — ABNORMAL HIGH (ref 70–99)

## 2023-12-10 LAB — HEPARIN LEVEL (UNFRACTIONATED): Heparin Unfractionated: 0.52 [IU]/mL (ref 0.30–0.70)

## 2023-12-10 LAB — PHOSPHORUS: Phosphorus: 2.6 mg/dL (ref 2.5–4.6)

## 2023-12-10 LAB — MAGNESIUM: Magnesium: 2.3 mg/dL (ref 1.7–2.4)

## 2023-12-10 LAB — PREPARE RBC (CROSSMATCH)

## 2023-12-10 MED ORDER — HYDRALAZINE HCL 20 MG/ML IJ SOLN
10.0000 mg | Freq: Four times a day (QID) | INTRAMUSCULAR | Status: DC | PRN
  Administered 2023-12-10 – 2023-12-11 (×2): 10 mg via INTRAVENOUS
  Filled 2023-12-10 (×2): qty 1

## 2023-12-10 MED ORDER — CHLORHEXIDINE GLUCONATE 4 % EX SOLN
60.0000 mL | Freq: Once | CUTANEOUS | Status: AC
  Administered 2023-12-11: 4 via TOPICAL

## 2023-12-10 MED ORDER — ENSURE PLUS HIGH PROTEIN PO LIQD
237.0000 mL | Freq: Two times a day (BID) | ORAL | Status: DC
  Administered 2023-12-12: 237 mL via ORAL

## 2023-12-10 MED ORDER — LABETALOL HCL 5 MG/ML IV SOLN
20.0000 mg | INTRAVENOUS | Status: DC | PRN
  Administered 2023-12-11 (×2): 20 mg via INTRAVENOUS
  Filled 2023-12-10 (×2): qty 4

## 2023-12-10 MED ORDER — SODIUM CHLORIDE 0.9% IV SOLUTION: Freq: Once | INTRAVENOUS | Status: AC

## 2023-12-10 MED ORDER — SALINE SPRAY 0.65 % NA SOLN: 1.0000 | NASAL | Status: DC | PRN

## 2023-12-10 MED ORDER — CHLORHEXIDINE GLUCONATE 4 % EX SOLN
60.0000 mL | Freq: Once | CUTANEOUS | Status: AC
  Administered 2023-12-10: 4 via TOPICAL

## 2023-12-10 MED ORDER — SODIUM CHLORIDE 0.9 % IV SOLN
2.0000 g | Freq: Three times a day (TID) | INTRAVENOUS | Status: DC
  Administered 2023-12-10 – 2023-12-13 (×8): 2 g via INTRAVENOUS
  Filled 2023-12-10 (×11): qty 12.5

## 2023-12-10 NOTE — Consult Note (Signed)
 PHARMACY - ANTICOAGULATION CONSULT NOTE  Pharmacy Consult for IV Heparin  Indication: chest pain/ACS  Patient Measurements: Height: 5' 8 (172.7 cm) Weight: 81.6 kg (180 lb) IBW/kg (Calculated) : 68.4 HEPARIN  DW (KG): 81.6  Labs: Recent Labs    12/08/23 0553 12/09/23 0523 12/10/23 0608  HGB 11.7* 10.9* 11.0*  HCT 35.1* 31.5* 32.8*  PLT 213 211 248  HEPARINUNFRC 0.50 0.51 0.52  CREATININE 1.02 1.01  --     Estimated Creatinine Clearance: 56.4 mL/min (by C-G formula based on SCr of 1.01 mg/dL).   Medical History: Past Medical History:  Diagnosis Date   Acute ST elevation myocardial infarction (STEMI) of inferior wall (HCC) 04/19/2010   a.) transfered from [facility] to [facility] --> LHC/PCI (very difficult procedure) --> 3.0 x 23 mm and 3.0 x 12 mm Xience stents to RCA   Allergies    Arthritis    Benign essential hypertension    Bilateral carotid artery disease (HCC) 05/08/2021   a.) carotid doppler 05/08/2021: 1-39% BICA   CAD (coronary artery disease) 04/19/2010   a.) inferior STEMI 04/19/2010 --> LHC/PCI: 50-70% pD1, 80% pRI, 90/90/90% RCA (overlapping 3.0 x 23 and 3.0 x 12 mm Xience DES); b.) MV 11/10/2018: fixed minimally reversible inferior perfusion defect   Cellulitis of foot    Chronic HFrEF (heart failure with reduced ejection fraction) (HCC)    DDD (degenerative disc disease), cervical    Diabetes mellitus type 2, insulin  dependent (HCC)    Diverticulosis    Full dentures    Gout    Hard of hearing    History of bilateral cataract extraction 2022   History of ETOH abuse    Hyperlipidemia    Ischemic cardiomyopathy 04/19/2010   a.) TTE 04/19/2010: 40%; b.) TTE 04/20/2014: EF >55%; c.) TTE 11/10/2018: EF 45%; d.) TTE 11/14/2022: EF 25-30%; e. 06/2023 Echo: EF 25-30%, nl RV size/fxn. Mild MR. Mild-mod TR. Ao sclerosis w/o stenosis.   Long term current use of aspirin     Long term current use of clopidogrel     Lumbar degenerative disc disease    Lumbar radiculopathy     Lumbar vertebral fracture (chronic superior endplate of L1)    NSTEMI (non-ST elevated myocardial infarction) (HCC) 01/15/2023   OSA (obstructive sleep apnea)    a.) unable to tolerate nocturnal PAP therapy   Peripheral artery disease (HCC)    a.) stenting 05/14/21: 12 mm x 12 cm LifeStent RIGHT dis SFA/prox pop; b.) s/p cath directed thrombolysis RIGHT SFA/pop 06/14/21; c.) s/p mech thrombectomy + stenting 06/15/21: 8 mm x 25cm & 8 mm x 7.5cm Viabahn; d.) s/p BILAT CFA, profunda femoris, SFA endarterectomies + fogarty embolectomy + stenting 11/15/22: 12mm x 58mm Lifestream BILAT CIAs, 14 mm x 6 cm Lifestream & 13 mm x 5 cm Viabahn LEFT EIA   Peripheral neuropathy    Umbilical hernia     Medications:  No anticoagulation prior to admission per my chart review. Takes ASA / Plavix  at home  Assessment: 78 y/o M with medical history as above and including CAD, PAD presenting to the ED 8/14 with right common femoral artery hematoma with active extravasation. Patient underwent endovascular intervention same day. However, clinical picture complicated by rising troponin (715 >> 6596). Pharmacy consulted to dose heparin . IV heparin  was stopped this morning for bleeding in groin; vascular surgery now wants to resume  Baseline Labs aPTT 55s and INR 1.3  Baseline H&H, platelets are within normal limits.  8/17:  HL @ 0538 = 0.27, SUBtherapeutic  8/17  1233 HL 0.52,  therapeutic x1 8/17 1951 HL 0.42, therapeutic x2 8/18 0553 HL 0.50, therapeutic X 3 8/19 0523 HL 0.51, therapeutic x 4 8/20 0608 HL 0.52, therapeutic x 5  Goal of Therapy:  Heparin  level 0.3-0.7 units/ml Monitor platelets by anticoagulation protocol: Yes   Plan:  Continue heparin  infusion at 2050 units/hr. Daily CBC, heparin  level. Monitor for signs/symptoms of bleeding.  Rankin CANDIE Dills, PharmD, [facility] 12/10/2023 6:38 AM

## 2023-12-10 NOTE — Progress Notes (Signed)
 Date of Admission:  12/04/2023      ID: Manuel [name] is a 78 y.o. male  Principal Problem:   Rupture of femoral artery (HCC) Active Problems:   Ischemic cardiomyopathy   Coronary artery disease involving native coronary artery of native heart   Gram-negative bacteremia   Serratia infection   Vascular graft infection (HCC)    Subjective: Pt states he just got up Doing okay  Medications:   atorvastatin   80 mg Oral QHS   bisacodyl   10 mg Oral QHS   carvedilol   6.25 mg Oral BID WC   chlorhexidine   60 mL Topical Once   And   [START ON 12/11/2023] chlorhexidine   60 mL Topical Once   Chlorhexidine  Gluconate Cloth  6 each Topical Daily   folic acid   1 mg Oral Daily   gabapentin   100 mg Oral QHS   insulin  aspart  0-15 Units Subcutaneous Q4H   insulin  glargine-yfgn  14 Units Subcutaneous Q2200   melatonin  5 mg Oral QHS   multivitamin with minerals  1 tablet Oral Daily   polyethylene glycol  17 g Oral BID   sodium chloride  flush  3 mL Intravenous Q12H   thiamine   100 mg Oral Daily   Or   thiamine   100 mg Intravenous Daily   trimethoprim -polymyxin b   1 drop Both Eyes Q6H    Objective: Vital signs in last 24 hours: Patient Vitals for the past 24 hrs:  BP Temp Temp src Pulse Resp SpO2  12/10/23 0408 (!) 149/90 98.2 F (36.8 C) -- 74 18 97 %  12/09/23 1945 (!) 150/75 98.8 F (37.1 C) Oral 79 18 91 %  12/09/23 1518 (!) 167/103 98.1 F (36.7 C) Oral 89 20 100 %     Lines and Device Date on insertion # of days DC  Chiropractor     Foley     ETT       PHYSICAL EXAM:  General: Alert, cooperative, no distress, appears stated age.  Lungs: Clear to auscultation bilaterally. No Wheezing or Rhonchi. No rales. Heart: Regular rate and rhythm, no murmur, rub or gallop. Extremities: rt thigh dressing over groin not removed Left leg erythema and edema better Neurologic: Grossly non-focal  Lab Results    Latest Ref Rng & Units 12/10/2023    6:08 AM  12/09/2023    5:23 AM 12/08/2023    5:53 AM  CBC  WBC 4.0 - 10.5 K/uL 7.5  5.6  7.4   Hemoglobin 13.0 - 17.0 g/dL 88.9  89.0  88.2   Hematocrit 39.0 - 52.0 % 32.8  31.5  35.1   Platelets 150 - 400 K/uL 248  211  213        Latest Ref Rng & Units 12/10/2023    6:08 AM 12/09/2023    5:23 AM 12/08/2023    5:53 AM  CMP  Glucose 70 - 99 mg/dL 871  86  82   BUN 8 - 23 mg/dL 18  22  27    Creatinine 0.61 - 1.24 mg/dL 9.07  8.98  8.97   Sodium 135 - 145 mmol/L 137  136  134   Potassium 3.5 - 5.1 mmol/L 4.0  3.5  4.0   Chloride 98 - 111 mmol/L 106  104  101   CO2 22 - 32 mmol/L 22  24  25    Calcium  8.9 - 10.3 mg/dL 8.4  8.3  8.6   Total Protein 6.5 - 8.1 g/dL  6.2   Total Bilirubin 0.0 - 1.2 mg/dL   0.7   Alkaline Phos 38 - 126 U/L   76   AST 15 - 41 U/L   25   ALT 0 - 44 U/L   22       Microbiology: 12/04/23 BC X 2 serratia 11/28/23 BC- NG  so far    Assessment/Plan:  Right common femoral artery graft with rupture and leaking into the cutaneous tissue forming a hematoma.  Patient underwent angio and had stent placement  Will be taken for surgery tomorrow for evacuation of the hematoma and removal of the graft and cultures will be sent.  I discussed with Dr. dreama  Serratia bacteremia.  Source is likely the right groin bovine graft.  Patient is on cefepime    There was Serratia from the wound in October 2024 was resistant to ceftriaxone  but the blood culture is currently susceptible to it. Ceftriaxone  has been changed to cefepime .  Repeat blood culture has been neg so far   History of bilateral endarterectomies of the right And left common femoral artery, profunda artery and superficial femoral artery, Fogarty embolectomy of right SFA, stent placement bilateral common iliac arteries, left external artery and right PTA on 11/15/2022.  Also had bovine pericardial patch angioplasties.  This was complicated by wound dehiscence on the right groin in September 2024.  Underwent debridement  superficial culture from the right groin in October 2024 was Serratia along with Staphylococcus aureus and he received 14 days of appropriate antibiotics.  At that time the endovascular graft infection was not a concern  Right BKA  Left leg edema with superficial wounds and possible cellulitis He has to keep the leg elevated when he is sitting.    CAD Diabetes mellitus  Discussed the management with the patient.

## 2023-12-10 NOTE — Progress Notes (Signed)
 Progress Note    12/10/2023 9:39 AM 6 Days Post-Op  Subjective:  Manuel [name] is an 78 yo male who presents to ED s/p fall after attempting to transfer from bed to wheel chair. Patient fell with walker landing on top of him. EMS was called for assistance. Patient refused to be transported to ED for evaluation initial. However as time when on patient note more intense pain in right groin area and due to his EMS was called back and patient was transported to ED.   On 12/04/23 patient underwent the following procedure:  Procedure(s) Performed:             1.  Introduction catheter into right lower extremity 3rd order catheter placement              2.    Contrast injection right lower extremity for distal runoff             3.  Percutaneous transluminal angioplasty and stent placement right profunda femoris and common femoral artery extending into the distal right external iliac artery             4.  Celt closure left common femoral arteriotomy  He has done well since this time. He is now POD #6. No complaints post procedure other than feeling fatigued. Vitals all remain stable.    Vitals:   12/09/23 1945 12/10/23 0408  BP: (!) 150/75 (!) 149/90  Pulse: 79 74  Resp: 18 18  Temp: 98.8 F (37.1 C) 98.2 F (36.8 C)  SpO2: 91% 97%   Physical Exam: Cardiac:  RRR, normal S1 and S2.  No murmurs appreciated. Lungs: Normal respiratory effort, clear on auscultation throughout.  Decreased breath sounds in the bases.  No rales rhonchi or wheezing. Incisions: Right groin femoral repair with dressing intact.  There is an opening to the skin with subcutaneous tissue from prior hematoma noted. Extremities: Bilateral lower extremities warm to touch.  Palpable pulses in both PT and DP PT noted. Abdomen: Positive bowel sounds throughout, soft, nontender nondistended. Neurologic: Alert and oriented x 3, answers all questions and follows commands appropriately.  CBC    Component Value Date/Time    WBC 7.5 12/10/2023 0608   RBC 3.83 (L) 12/10/2023 0608   HGB 11.0 (L) 12/10/2023 0608   HGB 15.7 02/16/2012 2013   HCT 32.8 (L) 12/10/2023 0608   HCT 44.4 02/16/2012 2013   PLT 248 12/10/2023 0608   PLT 176 02/16/2012 2013   MCV 85.6 12/10/2023 0608   MCV 91 02/16/2012 2013   MCH 28.7 12/10/2023 0608   MCHC 33.5 12/10/2023 0608   RDW 16.0 (H) 12/10/2023 0608   RDW 13.0 02/16/2012 2013   LYMPHSABS 0.5 (L) 12/04/2023 1141   MONOABS 0.5 12/04/2023 1141   EOSABS 0.1 12/04/2023 1141   BASOSABS 0.0 12/04/2023 1141    BMET    Component Value Date/Time   NA 137 12/10/2023 0608   NA 137 07/21/2023 1246   NA 137 02/16/2012 2013   K 4.0 12/10/2023 0608   K 3.8 02/16/2012 2013   CL 106 12/10/2023 0608   CL 102 02/16/2012 2013   CO2 22 12/10/2023 0608   CO2 25 02/16/2012 2013   GLUCOSE 128 (H) 12/10/2023 0608   GLUCOSE 154 (H) 02/16/2012 2013   BUN 18 12/10/2023 0608   BUN 30 (H) 07/21/2023 1246   BUN 10 02/16/2012 2013   CREATININE 0.92 12/10/2023 0608   CREATININE 0.72 02/16/2012 2013   CALCIUM  8.4 (L)  12/10/2023 0608   CALCIUM  9.6 02/16/2012 2013   GFRNONAA >60 12/10/2023 0608   GFRNONAA >60 02/16/2012 2013   GFRAA >60 12/09/2018 0929   GFRAA >60 02/16/2012 2013    INR    Component Value Date/Time   INR 1.3 (H) 12/04/2023 2147     Intake/Output Summary (Last 24 hours) at 12/10/2023 0939 Last data filed at 12/10/2023 0356 Gross per 24 hour  Intake 1632.33 ml  Output --  Net 1632.33 ml     Assessment/Plan:  78 y.o. male is s/p SEE ABOVE  6 Days Post-Op   PLAN Vascular surgery plans to take the patient to the operating room tomorrow for an incision and drainage of the hematoma in the right groin with washout, removal of a bovine pericardial patch as well as both stents, with reconstruction of the femoral artery using patient's vein.  Patient will then need a sartorius muscle flap for coverage of the femoral artery.  Wound will be left open with a wound VAC.  Dr.  Cordella Shawl MD spoke with the patient last night and the patient was agreeable.  I reiterated the procedure again today with the risks benefits and complications.  Patient again verbalizes understanding wishes to proceed.  Patient was made n.p.o. after midnight.  Patient is currently on aspirin  81 mg daily and Plavix  75 mg daily.  I discussed the case in detail with Dr. Cordella Shawl MD and he agrees with plan.   DVT prophylaxis: Aspirin  81 mg daily and Plavix  75 mg daily.   [name] [facility] 12/10/2023 9:39 AM

## 2023-12-10 NOTE — Progress Notes (Signed)
 Patient tried taking off bracelet and caused a skin tear to right wrist. CNA Artist of incident. Writer assessed. Mepilex on patient, denies pain. LDA updated.

## 2023-12-10 NOTE — Progress Notes (Signed)
 Progress Note   Patient: Manuel [name] FMW:[id number] DOB: Oct 11, 1945 DOA: 12/04/2023     6 DOS: the patient was seen and examined on 12/10/2023   Brief hospital course:  Manuel [name] is a 78 y.o. 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 Patient fell with walker landing on top of him.  EMS was called for assistance.  Patient refused to be transported to ED for evaluation initial. However as time when on patient note more intense pain in right groin area and due to his EMS was called back and patient was transported to ED.      ED Course:  99.3/tmx 100.7 BP 131/72, hr 113, rr 19 sat 95%  UA :rare bacteria , ketones 20 ,  Wbc 11.6, hgb 14.3, plt 191, increase pmn Na 126 ( 137), K 5.3, cl91, glu 438, cr 1.12 Lactic 2.9 ,3.3 Trop 284,3403, 8119 EKG: sinus tachycardia , RAD twave change inferior lateral leads   Patient on evaluation found to have right femoral a rupture, vascular consulted and performed repair.     Patient course was further complicated by NTSEMI , s/p right femoral a repair patient was started on heparin  drip . Cardiology was consulted and will see patient in am , but plans for intervention  recommend medical management.    PCCM was involved for comanagement. TRH was consulted for admission and further management as below.   I assumed care on 12/10/23. Further hospital course and management as outlined below.    Assessment and Plan:  Right femoral artery Rupture s/p repair S/p right EIA/CFA stenting, PTA of PFA --Vascular surgery following -- plan for OR on 8/21 for hematoma evacuation and prior patch removal due to bacteremia 8/17 Increased  opening of skin and subcutaneous tissue from prior hematoma.    ID consulted, patient had prior infection with same bacteria, possible endovascular infection.  As per ID continue cefepime  for now, patient may need 4 to 6 weeks of IV antibiotics.     NSTEMI Hx of CAD s/p pic RCA c/b NSTEMI 06/2023 s/p stent x 3 to LAD TTE LVEF 20 to 25%, severely decreased function and global hypokinesis. Grade 1 DD.  RV systolic function is severely reduced. -Troponin peaked 8119 >> 7607 --Cardiology consulted, appreciate recommendations --Continue aspirin  and statin --8/15 started Plavix  --continue heparin  drip    Chronic combined systolic/diastolic CHF  TTE as above.  Appears euvolemic GDMT limited by soft BP's. --Restart GDMT when BP permits --Follow cardiology 8/19 BP improved today, started Coreg  6.25 mg BID     Sepsis due to Serratia bacteremia, ?Pneumonia -  Opacities on chest imaging /fever/mild leukocytosis/lactic acidosis -s/p Vanco azithromycin  and cefepime  MRSA PCR negative --ID consulted as above --Discontinued vancomycin  azithromycin  --Continue cefepime  for now Blood culture growing Serratia, marcescens, pansensitive. --Follow ID recommendations for d/c antibiotics     Uncontrolled type 2 diabetes with hyperglycemia-- Started Semglee  14 units nightly --NovoLog  sliding scale --Monitor CBG    Isotonic hyponatremia - resolved with IV hydration Most likely nutritional deficiency Serum osmolality 280 WNL --Monitor BMP     PAD -s/p PTA -resume asa/plavix     HTN  8/15  low blood pressure.  DC'd Coreg  Started midodrine  10 mg p.o. 3 times daily with holding parameters 8/16 BP improved, decrease midodrine  5 mg p.o. 3 times daily with holding parameters Monitor BP and titrate medication accordingly   AKI due to hypotension versus contrast. Resolved Continue IV fluid for hydration Started Mucomyst  600 mg p.o. twice daily for 2 days Monitor renal functions and urine output Check  bladder scan Avoid nephrotoxic medications and use renally dose medications sCr 1.25>>1.01 and Co2 24, stable and resolved   Metabolic acidosis due to AKI.  Resolved 8/15  bicarbonate 100 mEq x 1 dose given   Hyperkalemia secondary to AKI. Resolved  Lokelma  10 g x 2 doses given Monitor BMP daily     OSA -02 at bedtime    DJD of the spine -supportive care    Hx of ETOH abuse -monitored on ciwa - now outside window for w/d sx's   Constipation: started laxatives 8/17 Dulcolax suppository x 1 dose given 8/18 constipation resolved, patient did move bowels after suppository, did not require enema.      Subjective: Pt seated edge of bed, wife at bedside on rounds this AM.  He reports very runny nose.  No complaint of right groin pain at this time.  No fever or chills.  He continues to feel extremely tired and weak.   Physical Exam: Vitals:   12/09/23 0755 12/09/23 1518 12/09/23 1945 12/10/23 0408  BP: 138/76 (!) 167/103 (!) 150/75 (!) 149/90  Pulse: 82 89 79 74  Resp: 16 20 18 18   Temp: 98.2 F (36.8 C) 98.1 F (36.7 C) 98.8 F (37.1 C) 98.2 F (36.8 C)  TempSrc: Oral Oral Oral   SpO2: 96% 100% 91% 97%  Weight:      Height:       General exam: awake, alert, no acute distress, chronically ill appearing HEENT: atraumatic, clear conjunctiva, anicteric sclera, oist mucus membranes, hearing grossly normal  Respiratory system: CTAB no wheezes, rales or rhonchi, normal respiratory effort. Cardiovascular system: normal S1/S2, RRR, no JVD, murmurs, rubs, gallops, no pedal edema.   Gastrointestinal system: soft, NT, ND, no HSM felt, +bowel sounds. Central nervous system: A&O x4. no gross focal neurologic deficits, normal speech Extremities: R BKA, no edema, normal tone Skin: dry, intact, normal temperature Psychiatry: normal mood, congruent affect, judgement and insight appear normal    Data Reviewed:  Notable labs -- glucose 128, Ca 8.4 otherwise normal BMP Hbg stable  11.0   Family Communication: Wife at bedside  Disposition: Status is: Inpatient Remains inpatient appropriate because: surgery planned tomorrow with vascular, remains on IV therapies   Planned Discharge Destination: Home with Home Health    Time spent: 45 minutes  Author: Burnard DELENA Cunning, DO 12/10/2023 3:48 PM  For on call review [email or web address].

## 2023-12-10 NOTE — H&P (View-Only) (Signed)
 Progress Note    12/10/2023 9:39 AM 6 Days Post-Op  Subjective:  Manuel [name] is an 78 yo male who presents to ED s/p fall after attempting to transfer from bed to wheel chair. Patient fell with walker landing on top of him. EMS was called for assistance. Patient refused to be transported to ED for evaluation initial. However as time when on patient note more intense pain in right groin area and due to his EMS was called back and patient was transported to ED.   On 12/04/23 patient underwent the following procedure:  Procedure(s) Performed:             1.  Introduction catheter into right lower extremity 3rd order catheter placement              2.    Contrast injection right lower extremity for distal runoff             3.  Percutaneous transluminal angioplasty and stent placement right profunda femoris and common femoral artery extending into the distal right external iliac artery             4.  Celt closure left common femoral arteriotomy  He has done well since this time. He is now POD #6. No complaints post procedure other than feeling fatigued. Vitals all remain stable.    Vitals:   12/09/23 1945 12/10/23 0408  BP: (!) 150/75 (!) 149/90  Pulse: 79 74  Resp: 18 18  Temp: 98.8 F (37.1 C) 98.2 F (36.8 C)  SpO2: 91% 97%   Physical Exam: Cardiac:  RRR, normal S1 and S2.  No murmurs appreciated. Lungs: Normal respiratory effort, clear on auscultation throughout.  Decreased breath sounds in the bases.  No rales rhonchi or wheezing. Incisions: Right groin femoral repair with dressing intact.  There is an opening to the skin with subcutaneous tissue from prior hematoma noted. Extremities: Bilateral lower extremities warm to touch.  Palpable pulses in both PT and DP PT noted. Abdomen: Positive bowel sounds throughout, soft, nontender nondistended. Neurologic: Alert and oriented x 3, answers all questions and follows commands appropriately.  CBC    Component Value Date/Time    WBC 7.5 12/10/2023 0608   RBC 3.83 (L) 12/10/2023 0608   HGB 11.0 (L) 12/10/2023 0608   HGB 15.7 02/16/2012 2013   HCT 32.8 (L) 12/10/2023 0608   HCT 44.4 02/16/2012 2013   PLT 248 12/10/2023 0608   PLT 176 02/16/2012 2013   MCV 85.6 12/10/2023 0608   MCV 91 02/16/2012 2013   MCH 28.7 12/10/2023 0608   MCHC 33.5 12/10/2023 0608   RDW 16.0 (H) 12/10/2023 0608   RDW 13.0 02/16/2012 2013   LYMPHSABS 0.5 (L) 12/04/2023 1141   MONOABS 0.5 12/04/2023 1141   EOSABS 0.1 12/04/2023 1141   BASOSABS 0.0 12/04/2023 1141    BMET    Component Value Date/Time   NA 137 12/10/2023 0608   NA 137 07/21/2023 1246   NA 137 02/16/2012 2013   K 4.0 12/10/2023 0608   K 3.8 02/16/2012 2013   CL 106 12/10/2023 0608   CL 102 02/16/2012 2013   CO2 22 12/10/2023 0608   CO2 25 02/16/2012 2013   GLUCOSE 128 (H) 12/10/2023 0608   GLUCOSE 154 (H) 02/16/2012 2013   BUN 18 12/10/2023 0608   BUN 30 (H) 07/21/2023 1246   BUN 10 02/16/2012 2013   CREATININE 0.92 12/10/2023 0608   CREATININE 0.72 02/16/2012 2013   CALCIUM  8.4 (L)  12/10/2023 0608   CALCIUM  9.6 02/16/2012 2013   GFRNONAA >60 12/10/2023 0608   GFRNONAA >60 02/16/2012 2013   GFRAA >60 12/09/2018 0929   GFRAA >60 02/16/2012 2013    INR    Component Value Date/Time   INR 1.3 (H) 12/04/2023 2147     Intake/Output Summary (Last 24 hours) at 12/10/2023 0939 Last data filed at 12/10/2023 0356 Gross per 24 hour  Intake 1632.33 ml  Output --  Net 1632.33 ml     Assessment/Plan:  78 y.o. male is s/p SEE ABOVE  6 Days Post-Op   PLAN Vascular surgery plans to take the patient to the operating room tomorrow for an incision and drainage of the hematoma in the right groin with washout, removal of a bovine pericardial patch as well as both stents, with reconstruction of the femoral artery using patient's vein.  Patient will then need a sartorius muscle flap for coverage of the femoral artery.  Wound will be left open with a wound VAC.  Dr.  Cordella Shawl MD spoke with the patient last night and the patient was agreeable.  I reiterated the procedure again today with the risks benefits and complications.  Patient again verbalizes understanding wishes to proceed.  Patient was made n.p.o. after midnight.  Patient is currently on aspirin  81 mg daily and Plavix  75 mg daily.  I discussed the case in detail with Dr. Cordella Shawl MD and he agrees with plan.   DVT prophylaxis: Aspirin  81 mg daily and Plavix  75 mg daily.   [name] [facility] 12/10/2023 9:39 AM

## 2023-12-10 NOTE — Progress Notes (Signed)
 PT Cancellation Note  Patient Details Name: Manuel [name] MRN: [id number] DOB: 06-Jul-1945   Cancelled Treatment:    Reason Eval/Treat Not Completed: Patient declined, no reason specified. Pt received sitting on [facility] with spouse assisting in self care ADL's. Declining participation with PT at this time secondary to fatigue. Pending procedure tomorrow. Agreeable for PT follow up on Friday after procedure.   Camie CHARLENA Kluver, PT, DPT 10:38 AM,12/10/23 Physical Therapist - [facility]

## 2023-12-11 ENCOUNTER — Inpatient Hospital Stay

## 2023-12-11 ENCOUNTER — Other Ambulatory Visit: Payer: Self-pay

## 2023-12-11 ENCOUNTER — Encounter: Payer: Self-pay | Admitting: Internal Medicine

## 2023-12-11 ENCOUNTER — Encounter
Admission: EM | Disposition: A | Discharge status: Skilled Nursing Facility | Payer: Self-pay | Source: Home / Self Care | Attending: Internal Medicine

## 2023-12-11 DIAGNOSIS — T827XXA Infection and inflammatory reaction due to other cardiac and vascular devices, implants and grafts, initial encounter: Secondary | ICD-10-CM | POA: Diagnosis not present

## 2023-12-11 DIAGNOSIS — B9689 Other specified bacterial agents as the cause of diseases classified elsewhere: Secondary | ICD-10-CM | POA: Diagnosis not present

## 2023-12-11 DIAGNOSIS — I724 Aneurysm of artery of lower extremity: Secondary | ICD-10-CM

## 2023-12-11 DIAGNOSIS — I772 Rupture of artery: Secondary | ICD-10-CM | POA: Diagnosis not present

## 2023-12-11 DIAGNOSIS — T81718A Complication of other artery following a procedure, not elsewhere classified, initial encounter: Secondary | ICD-10-CM

## 2023-12-11 HISTORY — PX: APPLICATION OF WOUND VAC: SHX5189

## 2023-12-11 HISTORY — PX: ENDARTERECTOMY FEMORAL: SHX5804

## 2023-12-11 LAB — CBC
HCT: 33.6 % — ABNORMAL LOW (ref 39.0–52.0)
HCT: 31.3 % — ABNORMAL LOW (ref 39.0–52.0)
Hemoglobin: 11.1 g/dL — ABNORMAL LOW (ref 13.0–17.0)
Hemoglobin: 10.5 g/dL — ABNORMAL LOW (ref 13.0–17.0)
MCH: 28.5 pg (ref 26.0–34.0)
MCH: 29 pg (ref 26.0–34.0)
MCHC: 33 g/dL (ref 30.0–36.0)
MCHC: 33.5 g/dL (ref 30.0–36.0)
MCV: 86.2 fL (ref 80.0–100.0)
MCV: 86.5 fL (ref 80.0–100.0)
Platelets: 260 K/uL (ref 150–400)
Platelets: 322 K/uL (ref 150–400)
RBC: 3.9 MIL/uL — ABNORMAL LOW (ref 4.22–5.81)
RBC: 3.62 MIL/uL — ABNORMAL LOW (ref 4.22–5.81)
RDW: 16.1 % — ABNORMAL HIGH (ref 11.5–15.5)
RDW: 16.4 % — ABNORMAL HIGH (ref 11.5–15.5)
WBC: 7 K/uL (ref 4.0–10.5)
WBC: 10.5 K/uL (ref 4.0–10.5)
nRBC: 0 % (ref 0.0–0.2)
nRBC: 0 % (ref 0.0–0.2)

## 2023-12-11 LAB — GLUCOSE, CAPILLARY
Glucose-Capillary: 102 mg/dL — ABNORMAL HIGH (ref 70–99)
Glucose-Capillary: 124 mg/dL — ABNORMAL HIGH (ref 70–99)
Glucose-Capillary: 129 mg/dL — ABNORMAL HIGH (ref 70–99)
Glucose-Capillary: 144 mg/dL — ABNORMAL HIGH (ref 70–99)
Glucose-Capillary: 146 mg/dL — ABNORMAL HIGH (ref 70–99)
Glucose-Capillary: 151 mg/dL — ABNORMAL HIGH (ref 70–99)
Glucose-Capillary: 84 mg/dL (ref 70–99)
Glucose-Capillary: 91 mg/dL (ref 70–99)
Glucose-Capillary: 99 mg/dL (ref 70–99)

## 2023-12-11 LAB — BASIC METABOLIC PANEL WITH GFR
Anion gap: 7 (ref 5–15)
BUN: 16 mg/dL (ref 8–23)
CO2: 22 mmol/L (ref 22–32)
Calcium: 8.4 mg/dL — ABNORMAL LOW (ref 8.9–10.3)
Chloride: 108 mmol/L (ref 98–111)
Creatinine, Ser: 0.85 mg/dL (ref 0.61–1.24)
GFR, Estimated: >60 mL/min (ref >60)
Glucose, Bld: 115 mg/dL — ABNORMAL HIGH (ref 70–99)
Potassium: 4.2 mmol/L (ref 3.5–5.1)
Sodium: 137 mmol/L (ref 135–145)

## 2023-12-11 LAB — HEPARIN LEVEL (UNFRACTIONATED): Heparin Unfractionated: 0.56 [IU]/mL (ref 0.30–0.70)

## 2023-12-11 SURGERY — ENDARTERECTOMY, FEMORAL
Anesthesia: General | Laterality: Right

## 2023-12-11 MED ORDER — VANCOMYCIN HCL 1 G IV SOLR
INTRAVENOUS | Status: DC | PRN
  Administered 2023-12-11: 1000 mg via TOPICAL

## 2023-12-11 MED ORDER — SODIUM CHLORIDE 0.9 % IV SOLN: INTRAVENOUS | Status: DC | PRN

## 2023-12-11 MED ORDER — ONDANSETRON HCL 4 MG/2ML IJ SOLN
INTRAMUSCULAR | Status: DC | PRN
  Administered 2023-12-11: 4 mg via INTRAVENOUS

## 2023-12-11 MED ORDER — FENTANYL CITRATE (PF) 100 MCG/2ML IJ SOLN
INTRAMUSCULAR | Status: AC
  Filled 2023-12-11: qty 2

## 2023-12-11 MED ORDER — VANCOMYCIN HCL 1000 MG IV SOLR
INTRAVENOUS | Status: AC
Start: 2023-12-11 — End: 2023-12-11
  Filled 2023-12-11: qty 20

## 2023-12-11 MED ORDER — PHENYLEPHRINE HCL-NACL 20-0.9 MG/250ML-% IV SOLN
INTRAVENOUS | Status: AC
  Filled 2023-12-11: qty 250

## 2023-12-11 MED ORDER — ACETAMINOPHEN 10 MG/ML IV SOLN
INTRAVENOUS | Status: AC
Start: 2023-12-11 — End: 2023-12-11
  Filled 2023-12-11: qty 100

## 2023-12-11 MED ORDER — HEPARIN SODIUM (PORCINE) 5000 UNIT/ML IJ SOLN
INTRAMUSCULAR | Status: AC
  Filled 2023-12-11: qty 1

## 2023-12-11 MED ORDER — SODIUM CHLORIDE 0.9 % IV SOLN: INTRAVENOUS | Status: DC

## 2023-12-11 MED ORDER — SUGAMMADEX SODIUM 200 MG/2ML IV SOLN
INTRAVENOUS | Status: DC | PRN
Start: 2023-12-11 — End: 2023-12-11
  Administered 2023-12-11: 200 mg via INTRAVENOUS

## 2023-12-11 MED ORDER — ROCURONIUM BROMIDE 100 MG/10ML IV SOLN
INTRAVENOUS | Status: DC | PRN
  Administered 2023-12-11: 10 mg via INTRAVENOUS
  Administered 2023-12-11: 20 mg via INTRAVENOUS
  Administered 2023-12-11: 10 mg via INTRAVENOUS
  Administered 2023-12-11: 50 mg via INTRAVENOUS

## 2023-12-11 MED ORDER — EPHEDRINE SULFATE (PRESSORS) 50 MG/ML IJ SOLN
INTRAMUSCULAR | Status: DC | PRN
  Administered 2023-12-11: 5 mg via INTRAVENOUS

## 2023-12-11 MED ORDER — DEXAMETHASONE SODIUM PHOSPHATE 10 MG/ML IJ SOLN
INTRAMUSCULAR | Status: AC
  Filled 2023-12-11: qty 1

## 2023-12-11 MED ORDER — FENTANYL CITRATE (PF) 100 MCG/2ML IJ SOLN
INTRAMUSCULAR | Status: DC | PRN
  Administered 2023-12-11 (×4): 50 ug via INTRAVENOUS

## 2023-12-11 MED ORDER — HEPARIN 30,000 UNITS/1000 ML (OHS) CELLSAVER SOLUTION
Status: AC
  Filled 2023-12-11: qty 1000

## 2023-12-11 MED ORDER — DEXAMETHASONE SODIUM PHOSPHATE 10 MG/ML IJ SOLN
INTRAMUSCULAR | Status: DC | PRN
  Administered 2023-12-11: 5 mg via INTRAVENOUS

## 2023-12-11 MED ORDER — HEPARIN SODIUM (PORCINE) 1000 UNIT/ML IJ SOLN
INTRAMUSCULAR | Status: DC | PRN
  Administered 2023-12-11: 6000 [IU] via INTRAVENOUS

## 2023-12-11 MED ORDER — PHENYLEPHRINE HCL-NACL 20-0.9 MG/250ML-% IV SOLN
INTRAVENOUS | Status: DC | PRN
  Administered 2023-12-11: 30 ug/min via INTRAVENOUS

## 2023-12-11 MED ORDER — GENTAMICIN SULFATE 40 MG/ML IJ SOLN: INTRAMUSCULAR | Status: DC | PRN

## 2023-12-11 MED ORDER — PHENYLEPHRINE 80 MCG/ML (10ML) SYRINGE FOR IV PUSH (FOR BLOOD PRESSURE SUPPORT)
PREFILLED_SYRINGE | INTRAVENOUS | Status: AC
  Filled 2023-12-11: qty 10

## 2023-12-11 MED ORDER — PHENYLEPHRINE HCL (PRESSORS) 10 MG/ML IV SOLN
INTRAVENOUS | Status: DC | PRN
Start: 2023-12-11 — End: 2023-12-11
  Administered 2023-12-11: 80 ug via INTRAVENOUS
  Administered 2023-12-11: 160 ug via INTRAVENOUS

## 2023-12-11 MED ORDER — LIDOCAINE HCL (PF) 2 % IJ SOLN
INTRAMUSCULAR | Status: AC
Start: 2023-12-11 — End: 2023-12-11
  Filled 2023-12-11: qty 5

## 2023-12-11 MED ORDER — LACTATED RINGERS IV SOLN: INTRAVENOUS | Status: DC | PRN

## 2023-12-11 MED ORDER — VASHE WOUND IRRIGATION OPTIME
TOPICAL | Status: DC | PRN
  Administered 2023-12-11 (×2): 34 [oz_av] via TOPICAL

## 2023-12-11 MED ORDER — PROPOFOL 10 MG/ML IV BOLUS
INTRAVENOUS | Status: DC | PRN
  Administered 2023-12-11: 150 mg via INTRAVENOUS

## 2023-12-11 MED ORDER — HYDROMORPHONE HCL 1 MG/ML IJ SOLN
INTRAMUSCULAR | Status: AC
  Filled 2023-12-11: qty 1

## 2023-12-11 MED ORDER — ROCURONIUM BROMIDE 10 MG/ML (PF) SYRINGE
PREFILLED_SYRINGE | INTRAVENOUS | Status: AC
  Filled 2023-12-11: qty 10

## 2023-12-11 MED ORDER — HYDROMORPHONE HCL 1 MG/ML IJ SOLN
1.0000 mg | INTRAMUSCULAR | Status: DC | PRN
  Administered 2023-12-12 (×3): 1 mg via INTRAVENOUS
  Filled 2023-12-11 (×3): qty 1

## 2023-12-11 MED ORDER — FENTANYL CITRATE (PF) 100 MCG/2ML IJ SOLN
25.0000 ug | INTRAMUSCULAR | Status: DC | PRN
  Administered 2023-12-11 (×2): 50 ug via INTRAVENOUS

## 2023-12-11 MED ORDER — ACETAMINOPHEN 10 MG/ML IV SOLN
INTRAVENOUS | Status: DC | PRN
  Administered 2023-12-11: 1000 mg via INTRAVENOUS

## 2023-12-11 MED ORDER — LIDOCAINE HCL (CARDIAC) PF 100 MG/5ML IV SOSY
PREFILLED_SYRINGE | INTRAVENOUS | Status: DC | PRN
  Administered 2023-12-11: 80 mg via INTRAVENOUS

## 2023-12-11 MED ORDER — HYDROMORPHONE HCL 1 MG/ML IJ SOLN
INTRAMUSCULAR | Status: DC | PRN
  Administered 2023-12-11: .5 mg via INTRAVENOUS

## 2023-12-11 MED ORDER — HEPARIN SODIUM (PORCINE) 5000 UNIT/ML IJ SOLN
INTRAMUSCULAR | Status: DC | PRN
  Administered 2023-12-11: 501 mL

## 2023-12-11 MED ORDER — CEFEPIME 1 G IM INJECTION: INTRAMUSCULAR | Status: DC | PRN

## 2023-12-11 MED ORDER — CEFAZOLIN SODIUM-DEXTROSE 2-4 GM/100ML-% IV SOLN: 2.0000 g | INTRAVENOUS | Status: DC

## 2023-12-11 MED ORDER — GENTAMICIN SULFATE 40 MG/ML IJ SOLN
INTRAMUSCULAR | Status: AC
  Filled 2023-12-11: qty 2

## 2023-12-11 MED ORDER — ACETAMINOPHEN 10 MG/ML IV SOLN
INTRAVENOUS | Status: AC
  Filled 2023-12-11: qty 100

## 2023-12-11 MED ORDER — EPHEDRINE 5 MG/ML INJ
INTRAVENOUS | Status: AC
  Filled 2023-12-11: qty 5

## 2023-12-11 MED ORDER — FENTANYL CITRATE (PF) 100 MCG/2ML IJ SOLN
INTRAMUSCULAR | Status: AC
Start: 2023-12-11 — End: 2023-12-11
  Filled 2023-12-11: qty 2

## 2023-12-11 MED ORDER — PROPOFOL 10 MG/ML IV BOLUS
INTRAVENOUS | Status: AC
Start: 2023-12-11 — End: 2023-12-11
  Filled 2023-12-11: qty 20

## 2023-12-11 MED ORDER — ONDANSETRON HCL 4 MG/2ML IJ SOLN
INTRAMUSCULAR | Status: AC
Start: 2023-12-11 — End: 2023-12-11
  Filled 2023-12-11: qty 2

## 2023-12-11 MED ORDER — DROPERIDOL 2.5 MG/ML IJ SOLN: 0.6250 mg | Freq: Once | INTRAMUSCULAR | Status: DC | PRN

## 2023-12-11 MED ORDER — SODIUM CHLORIDE 0.9 % IV SOLN: 2.0000 g | Freq: Once | INTRAVENOUS | Status: DC

## 2023-12-11 SURGICAL SUPPLY — 65 items
BAG DECANTER FOR FLEXI CONT (MISCELLANEOUS) ×1 IMPLANT
BLADE SURG SZ11 CARB STEEL (BLADE) ×1 IMPLANT
BNDG COHESIVE 6X5 TAN ST LF (GAUZE/BANDAGES/DRESSINGS) ×1 IMPLANT
BRUSH SCRUB EZ 4% CHG (MISCELLANEOUS) ×1 IMPLANT
CANISTER WOUND CARE 500ML ATS (WOUND CARE) IMPLANT
CHLORAPREP W/TINT 26 (MISCELLANEOUS) ×1 IMPLANT
CLAMP SUTURE YELLOW 5 PAIRS (MISCELLANEOUS) ×1 IMPLANT
CLEANSER WND VASHE 34 (WOUND CARE) IMPLANT
CLIP APPLIE 11 MED OPEN (CLIP) IMPLANT
CLIP APPLIE 9.375 SM OPEN (CLIP) IMPLANT
CNTNR URN SCR LID CUP LEK RST (MISCELLANEOUS) IMPLANT
COUNTER NDL MAGNETIC 40 RED (SET/KITS/TRAYS/PACK) IMPLANT
COUNTER NEEDLE MAGNETIC 40 RED (SET/KITS/TRAYS/PACK) ×1 IMPLANT
DRAPE EXTREMITY 106X87X128.5 (DRAPES) IMPLANT
DRAPE INCISE IOBAN 66X45 STRL (DRAPES) ×1 IMPLANT
DRAPE SHEET LG 3/4 BI-LAMINATE (DRAPES) IMPLANT
DRESSING SURGICEL FIBRLLR 1X2 (HEMOSTASIS) ×1 IMPLANT
DRSG EMULSION OIL 3X3 NADH (GAUZE/BANDAGES/DRESSINGS) IMPLANT
DRSG OPSITE POSTOP 4X6 (GAUZE/BANDAGES/DRESSINGS) IMPLANT
DRSG VAC GRANUFOAM LG (GAUZE/BANDAGES/DRESSINGS) IMPLANT
DRSG VAC GRANUFOAM MED (GAUZE/BANDAGES/DRESSINGS) IMPLANT
ELECT CAUTERY BLADE 6.4 (BLADE) IMPLANT
ELECTRODE REM PT RTRN 9FT ADLT (ELECTROSURGICAL) ×1 IMPLANT
GAUZE SPONGE 4X4 12PLY STRL (GAUZE/BANDAGES/DRESSINGS) IMPLANT
GAUZE STRETCH 2X75IN STRL (MISCELLANEOUS) IMPLANT
GLOVE BIO SURGEON STRL SZ7 (GLOVE) ×2 IMPLANT
GLOVE SURG SYN 8.0 PF PI (GLOVE) ×1 IMPLANT
GOWN STRL REUS W/ TWL LRG LVL3 (GOWN DISPOSABLE) ×1 IMPLANT
GOWN STRL REUS W/ TWL XL LVL3 (GOWN DISPOSABLE) ×1 IMPLANT
GOWN STRL REUS W/TWL 2XL LVL3 (GOWN DISPOSABLE) ×1 IMPLANT
IV NS 500ML BAXH (IV SOLUTION) ×1 IMPLANT
KIT STIMULAN RAPID CURE 5CC (Orthopedic Implant) IMPLANT
KIT TURNOVER KIT A (KITS) ×1 IMPLANT
LABEL OR SOLS (LABEL) ×1 IMPLANT
LOOP VESSEL MAXI 1X406 RED (MISCELLANEOUS) ×2 IMPLANT
LOOP VESSEL MINI 0.8X406 BLUE (MISCELLANEOUS) ×3 IMPLANT
MANIFOLD NEPTUNE II (INSTRUMENTS) ×1 IMPLANT
NDL HYPO 18GX1.5 BLUNT FILL (NEEDLE) ×1 IMPLANT
NEEDLE HYPO 18GX1.5 BLUNT FILL (NEEDLE) ×1 IMPLANT
PACK BASIN MAJOR ARMC (MISCELLANEOUS) ×1 IMPLANT
PACK UNIVERSAL (MISCELLANEOUS) ×1 IMPLANT
PAD PREP OB/GYN DISP 24X41 (PERSONAL CARE ITEMS) ×1 IMPLANT
SET WALTER ACTIVATION W/DRAPE (SET/KITS/TRAYS/PACK) ×1 IMPLANT
SPIKE FLUID TRANSFER (MISCELLANEOUS) ×1 IMPLANT
SPONGE T-LAP 18X18 ~~LOC~~+RFID (SPONGE) IMPLANT
STAPLER SKIN PROX 35W (STAPLE) ×1 IMPLANT
STOCKINETTE IMPERV 14X48 (MISCELLANEOUS) ×1 IMPLANT
SUCTION TUBE FRAZIER 10FR DISP (SUCTIONS) IMPLANT
SUT PROLENE 4 0 SH DA (SUTURE) IMPLANT
SUT PROLENE 5 0 RB 1 DA (SUTURE) ×4 IMPLANT
SUT PROLENE 6 0 BV (SUTURE) ×6 IMPLANT
SUT PROLENE 7 0 BV 1 (SUTURE) ×4 IMPLANT
SUT SILK 2-0 18XBRD TIE 12 (SUTURE) ×1 IMPLANT
SUT SILK 3-0 18XBRD TIE 12 (SUTURE) ×1 IMPLANT
SUT VIC AB 2-0 CT1 36 (SUTURE) IMPLANT
SUT VIC AB 2-0 CT1 TAPERPNT 27 (SUTURE) ×2 IMPLANT
SUT VIC AB 3-0 SH 27X BRD (SUTURE) ×1 IMPLANT
SUT VICRYL+ 3-0 36IN CT-1 (SUTURE) ×2 IMPLANT
SUTURE EHLN 3-0 FS-10 30 BLK (SUTURE) ×1 IMPLANT
SUTURE PROLEN 5-0 BB 36X2 ARM (SUTURE) IMPLANT
SWAB CULTURE AMIES ANAERIB BLU (MISCELLANEOUS) IMPLANT
SYR 5ML LL (SYRINGE) ×1 IMPLANT
TRAP FLUID SMOKE EVACUATOR (MISCELLANEOUS) ×1 IMPLANT
TRAY FOLEY MTR SLVR 16FR STAT (SET/KITS/TRAYS/PACK) ×1 IMPLANT
WATER STERILE IRR 500ML POUR (IV SOLUTION) ×1 IMPLANT

## 2023-12-11 NOTE — Progress Notes (Signed)
 OT Cancellation Note  Patient Details Name: Manuel [name] MRN: [id number] DOB: 03-11-1946   Cancelled Treatment:    Reason Eval/Treat Not Completed: Patient at procedure or test/ unavailable. Pt off unit for surgical procedure. OT to re-assess pt when he is next available and he is able to actively participate.   Izetta Claude, MS, OTR/L , CBIS ascom [phone]  12/11/23, 12:22 PM

## 2023-12-11 NOTE — Interval H&P Note (Signed)
 History and Physical Interval Note:  12/11/2023 11:03 AM  Manuel [name]  has presented today for surgery, with the diagnosis of Right common femoral artery hematoma.  The various methods of treatment have been discussed with the patient and family. After consideration of risks, benefits and other options for treatment, the patient has consented to  Procedure(s) with comments: ENDARTERECTOMY, FEMORAL (Right) - Re-do of femoral endarterectomy with sartorius flap APPLICATION, WOUND VAC (Right) as a surgical intervention.  The patient's history has been reviewed, patient examined, no change in status, stable for surgery.  I have reviewed the patient's chart and labs.  Questions were answered to the patient's satisfaction.     Cordella Shawl

## 2023-12-11 NOTE — Progress Notes (Signed)
 Progress Note   Patient: Manuel [name] FMW:[id number] DOB: 22-Apr-1946 DOA: 12/04/2023     7 DOS: the patient was seen and examined on 12/11/2023   Brief hospital course:  LARWENCE TU is a 78 y.o. 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 Patient fell with walker landing on top of him.  EMS was called for assistance.  Patient refused to be transported to ED for evaluation initial. However as time when on patient note more intense pain in right groin area and due to his EMS was called back and patient was transported to ED.      ED Course:  99.3/tmx 100.7 BP 131/72, hr 113, rr 19 sat 95%  UA :rare bacteria , ketones 20 ,  Wbc 11.6, hgb 14.3, plt 191, increase pmn Na 126 ( 137), K 5.3, cl91, glu 438, cr 1.12 Lactic 2.9 ,3.3 Trop 284,3403, 8119 EKG: sinus tachycardia , RAD twave change inferior lateral leads   Patient on evaluation found to have right femoral a rupture, vascular consulted and performed repair.     Patient course was further complicated by NTSEMI , s/p right femoral a repair patient was started on heparin  drip . Cardiology was consulted and will see patient in am , but plans for intervention  recommend medical management.    PCCM was involved for comanagement. [facility] was consulted for admission and further management as below.   I assumed care on 12/10/23. Further hospital course and management as outlined below.    Assessment and Plan:  Right femoral artery Rupture s/p repair S/p right EIA/CFA stenting, PTA of PFA --Vascular surgery following -- plan for OR today for hematoma evacuation and prior patch removal due to bacteremia 8/17 Increased  opening of skin and subcutaneous tissue from prior hematoma.    ID consulted, patient had prior infection with same bacteria, possible endovascular infection.   --Continue cefepime  --May need 4 to 6 weeks of IV antibiotics per ID     NSTEMI Hx of CAD s/p pic RCA c/b NSTEMI 06/2023 s/p stent x 3 to LAD TTE LVEF 20 to 25%, severely decreased function and global hypokinesis. Grade 1 DD.  RV systolic function is severely reduced. -Troponin peaked 8119 >> 7607 --Cardiology consulted, appreciate recommendations --Continue aspirin  and statin --8/15 started Plavix  --continue heparin  drip    Chronic combined systolic/diastolic CHF  TTE as above.  Appears euvolemic GDMT limited by soft BP's. --Restart GDMT when BP permits --Follow cardiology 8/19 BP improved today, started Coreg  6.25 mg BID     Sepsis due to Serratia bacteremia, ?Pneumonia -  Opacities on chest imaging /fever/mild leukocytosis/lactic acidosis -s/p Vanco azithromycin  and cefepime  MRSA PCR negative --ID consulted as above --Discontinued vancomycin  azithromycin  --Continue cefepime  for now Blood culture growing Serratia, marcescens, pansensitive. --Follow ID recommendations for d/c antibiotics     Uncontrolled type 2 diabetes with hyperglycemia-- Started Semglee  14 units nightly --NovoLog  sliding scale --Monitor CBG    Isotonic hyponatremia - resolved with IV hydration Most likely nutritional deficiency Serum osmolality 280 WNL --Monitor BMP     PAD -s/p PTA -resume asa/plavix     HTN  8/15 low blood pressure.  DC'd Coreg  Started midodrine  10 mg p.o. 3 times daily with holding parameters 8/16 BP improved, decrease midodrine  5 mg p.o. 3 times daily with holding parameters Monitor BP and titrate medication accordingly   AKI due to hypotension versus contrast. Resolved Continue IV fluid for hydration Started Mucomyst  600 mg p.o. twice daily for 2 days Monitor renal functions and urine output Check bladder  scan Avoid nephrotoxic medications and use renally dose medications sCr 1.25>>1.01 and Co2 24, stable and resolved   Metabolic acidosis due to AKI.  Resolved 8/15  bicarbonate 100 mEq x 1 dose given   Hyperkalemia secondary to AKI. Resolved  Lokelma  10 g x 2 doses given Monitor BMP daily     OSA -02 at bedtime    DJD of the spine -supportive care    Hx of ETOH abuse -monitored on ciwa - now outside window for w/d sx's   Constipation: started laxatives 8/17 Dulcolax suppository x 1 dose given 8/18 constipation resolved, patient did move bowels after suppository, did not require enema.      Subjective: Pt going for surgery with vascular today.  Denies acute complaints.   Physical Exam: Vitals:   12/10/23 2023 12/11/23 0545 12/11/23 0924 12/11/23 1042  BP: (!) 178/86 (!) 139/90 (!) 154/98 (!) 161/95  Pulse: 77 72 73 78  Resp: 16 16 18 18   Temp: 98 F (36.7 C) 98.6 F (37 C) 98.7 F (37.1 C) 97.9 F (36.6 C)  TempSrc:    Oral  SpO2: 92% 100% 100% 96%  Weight:      Height:       General exam: awake, alert, no acute distress, chronically ill appearing HEENT: moist mucus membranes, hearing grossly normal  Respiratory system: CTAB no wheezes, rales or rhonchi, normal respiratory effort. Cardiovascular system: normal S1/S2, RRR Gastrointestinal system: soft, NT, ND Central nervous system: A&O x4. no gross focal neurologic deficits, normal speech Extremities: R BKA, no edema, normal tone Skin: dry, intact, normal temperature Psychiatry: normal mood, congruent affect, judgement and insight appear normal    Data Reviewed:  Notable labs -- glucose 115, Ca 8.4  Hbg stable 11.0 >> 11.1   Family Communication: Wife at bedside on rounds 8/20  Disposition: Status is: Inpatient Remains inpatient appropriate because: surgery planned tomorrow with vascular, remains on IV therapies   Planned Discharge Destination: Home with Home Health    Time spent: 40  minutes  Author: Burnard DELENA Cunning, DO 12/11/2023 2:43 PM  For on call review [email or web address].

## 2023-12-11 NOTE — Anesthesia Procedure Notes (Signed)
 Arterial Line Insertion Start/End8/21/2025 12:47 PM, 12/11/2023 12:52 PM Performed by: Celinda Lorilee SAUNDERS, RN  Patient location: OR. Preanesthetic checklist: patient identified, IV checked, site marked, risks and benefits discussed, surgical consent, monitors and equipment checked, pre-op evaluation, timeout performed and anesthesia consent Patient sedated Right, radial was placed Catheter size: 20 G Hand hygiene performed  Allen's test indicative of satisfactory collateral circulation Attempts: 1 Procedure performed using ultrasound guided technique. Ultrasound Notes:anatomy identified, needle tip was noted to be adjacent to the nerve/plexus identified and no ultrasound evidence of intravascular and/or intraneural injection Following insertion, dressing applied. Post procedure assessment: normal and unchanged  Patient tolerated the procedure well with no immediate complications. Additional procedure comments: Sterile gloves utilized for insertion.

## 2023-12-11 NOTE — Transfer of Care (Signed)
 Immediate Anesthesia Transfer of Care Note  Patient: Manuel [name]  Procedure(s) Performed: ENDARTERECTOMY, FEMORAL (Right) APPLICATION, WOUND VAC (Right)  Patient Location: PACU  Anesthesia Type:General  Level of Consciousness: drowsy  Airway & Oxygen Therapy: Patient Spontanous Breathing and Patient connected to face mask oxygen  Post-op Assessment: Report given to RN  Post vital signs: stable  Last Vitals:  Vitals Value Taken Time  BP 131/74 12/11/23 17:22  Temp    Pulse 70 12/11/23 17:28  Resp 19 12/11/23 17:28  SpO2 100 % 12/11/23 17:28  Vitals shown include unfiled device data.  Last Pain:  Vitals:   12/11/23 1042  TempSrc: Oral  PainSc: 8       Patients Stated Pain Goal: 2 (12/06/23 1230)  Complications: No notable events documented.

## 2023-12-11 NOTE — Anesthesia Procedure Notes (Addendum)
 Procedure Name: Intubation Date/Time: 12/11/2023 12:40 PM  Performed by: Nova Schmuhl, CRNAPre-anesthesia Checklist: Patient identified, Emergency Drugs available, Suction available and Patient being monitored Patient Re-evaluated:Patient Re-evaluated prior to induction Oxygen Delivery Method: Circle System Utilized Preoxygenation: Pre-oxygenation with 100% oxygen Induction Type: IV induction Ventilation: Mask ventilation without difficulty Laryngoscope Size: McGrath and 4 Grade View: Grade I Tube type: Oral Tube size: 7.0 mm Number of attempts: 1 Airway Equipment and Method: Stylet and Oral airway Placement Confirmation: ETT inserted through vocal cords under direct vision, positive ETCO2 and breath sounds checked- equal and bilateral Secured at: 22 cm Tube secured with: Tape Dental Injury: Teeth and Oropharynx as per pre-operative assessment  Comments: Easy, atraumatic intubation. Head and neck midline. Lips and tongue unchanged.

## 2023-12-11 NOTE — Op Note (Signed)
 OPERATIVE NOTE   PROCEDURE: 1.   Right external iliac artery to profunda femoris artery bypass with autologous ipsilateral superficial femoral artery 2.   Resection of infected right common femoral artery and removal of common femoral artery, profunda femoris artery, and superficial femoral artery stents 3.   Harvest of right superficial femoral artery for bypass with endarterectomy of right superficial femoral artery to make it usable for bypass conduit 4.   Right sartorius flap placement 5.   Negative pressure dressing placement right groin 6.   Redo groin exploration for vascular surgery    PRE-OPERATIVE DIAGNOSIS: 1.infected right common femoral artery patch and previously placed stents in the common femoral artery, profunda femoris artery, and superficial femoral artery   POST-OPERATIVE DIAGNOSIS: Same  SURGEON: Selinda Gu, MD  CO-surgeon: Cordella Shawl, MD  ANESTHESIA:  general  ESTIMATED BLOOD LOSS: 1000 cc   SPECIMEN(S):  Right common femoral, profunda femoris, and superficial femoral artery stents Plaque and thrombus from right femoral artery resection sent for specimen and culture Plaque from right superficial femoral artery.  INDICATIONS:    Patient presents with bleeding from a right femoral artery pseudoaneurysm after remote surgery on that side.  He was found to have positive blood cultures and this was felt to likely be secondary to infection.  He had a bovine pericardial patch placed previously and he had stents in the SFA.  He had to have a stent placed in the common femoral artery and profunda femoris artery to stop the life-threatening hemorrhage previously.  These were all presumed to be infected now and need to be removed as does the bovine pericardial patch and any infected tissue.  The risks and benefits as well as alternative therapies including intervention were reviewed in detail all questions were answered the patient agrees to proceed with  surgery.  DESCRIPTION: After obtaining full informed written consent, the patient was brought back to the operating room and placed supine upon the operating table.  The patient received IV antibiotics prior to induction.  After obtaining adequate anesthesia, the patient was prepped and draped in the standard fashion appropriate time out is called.    Vertical incision was created overlying the right femoral arteries. The common femoral artery proximally well up above the circumflex vessels into the external iliac artery, and superficial femoral artery, and primary profunda femoris artery branches were encircled with vessel loops and prepared for control.  The profunda femoris artery was dissected out well down the thigh to make sure that adequate control could be obtained.  The superficial femoral artery was occluded but appeared to be the only native conduit that could be used for bypass in the situation.  This was dissected out a good 8 to 10 cm down the thigh to allow this to be a conduit for the bypass.  Specimens were taken from the fluid and tissue around the artery, from the thrombus within the artery, and these were sent for culture and specimen.  There was a significant hematoma cavity that was evacuated and removed.  There was a thick rind of inflammatory tissue surrounding the artery.  The bovine pericardial patch could seen to be disrupted and the Viabahn stent in the common femoral artery and down into the profunda femoris artery was readily visible. To get down far enough on the SFA, the incision was extended down and it could be controlled the external iliac artery control was tedious large vein branches had to be controlled with 5-0 Prolene sutures.  Due to  the redo nature of the incision as well as the inflammatory nature, this redo surgery was extremely tedious and difficult and multiple rents in venous branches had to be controlled with 5-0 Prolene sutures.  This was an extremely  difficult situation and trying to find good tissue to sew to proximally and distally was difficult.  Ultimately, we dissected out the SFA far enough to make a conduit to sew from the external iliac artery down to the profunda femoris artery.  The SFA was dissected proximally and distally.  The stent from the SFA was removed and sent for specimen.  There was also significant plaque in the SFA and had to perform an endarterectomy to remove this plaque to allow it to be used as her conduit.  This was invaginated to some degree and the plaque was removed and easily.  This was a length of about 8 to 10 cm and now would be adequate for a conduit.  At this point, we prepared for control.  The patient was systemically heparinized.  A large profunda clamp was used proximally.  Another large vascular clamp was used in the profunda femoris artery below the area of the joint previously stented.  We then removed the common femoral artery and profunda femoris artery stents quite easily.  There was thrombus associated with them.  The common femoral artery had to be debrided.  2 relatively healthy tissue around the new bypass.  All clearly infected or nonviable tissue was removed. We then prepared the SFA for anastomosis to the external iliac artery proximally.  A running anastomosis was created in an end-to-end fashion with a 4-0 Prolene suture.  Two 5-0 Prolene patch sutures were used and hemostasis was achieved after creating the proximal anastomosis.  There was brisk pulsatile inflow.  We then reclamped the artery proximally. The distal portion of the SFA conduit was then spatulated to match the opening of the profunda femoris artery that we would sew to.  This anastomosis was created with two 5-0 Prolene sutures 1 started at the toe and was started at the heel.  This was an end-to-end anastomosis.  The vessel was flushed and de-aired prior to release of control.  On release of control, a couple of 5-0 Prolene patch sutures  with pledgets were used for hemostasis and hemostasis was achieved.  On release of control, there was an excellent pulse in the profunda femoris artery distally beyond the bypass.  Hemostasis was achieved.  A liter of Vashe irrigation was used to irrigate the wound. At this point, we turned our attention to the creation of the sartorius flap.  This was mobilized in the typical fashion from the lateral attachments of the sartorius flap up to its attachment at the hip.  It was taken down at the proximal attachment and flipped over onto the artery without difficulty.  Hemostasis was easily achieved with electrocautery and all major vessels feeding the artery remained in place.  The sartorius muscle was then flipped over onto the artery and secured to the medial wall and superior wall with interrupted figure-of-eight 2-0 Prolene sutures.  Excellent closure was created after this.  A layer of 2-0 Vicryl sutures were used to close some of the dead space laterally.  The wound measured 22 cm in length, 11 cm in width, and about 4 cm in depth following this. We then cut a medium VAC sponge to fit the wound.  It was tucked into the wound.  Strips of Ioban were used and a  good occlusive seal was obtained once connected to suction.  The patient was then taken to the recovery room in stable condition having tolerated the procedure well.    COMPLICATIONS: None  CONDITION: Stable     Selinda Gu 12/11/2023 5:19 PM   This note was created with Dragon Medical transcription system. Any errors in dictation are purely unintentional.

## 2023-12-11 NOTE — Anesthesia Preprocedure Evaluation (Signed)
 Anesthesia Evaluation  Patient identified by MRN, date of birth, ID band Patient confused    Reviewed: Allergy & Precautions, H&P , NPO status , Patient's Chart, lab work & pertinent test results  History of Anesthesia Complications Negative for: history of anesthetic complications  Airway Mallampati: III  TM Distance: >3 FB Neck ROM: full    Dental  (+) Edentulous Upper, Edentulous Lower, Dental Advidsory Given   Pulmonary neg shortness of breath, sleep apnea , neg COPD, neg recent URI, Patient abstained from smoking.   Pulmonary exam normal        Cardiovascular Exercise Tolerance: Poor hypertension, (-) angina + CAD, + Past MI, + Cardiac Stents, + Peripheral Vascular Disease and +CHF  (-) dysrhythmias (-) Valvular Problems/Murmurs + Peripheral Edema ECHO 11/14/22:  1. Left ventricular ejection fraction, by estimation, is 25 to 30%. The left ventricle has severely decreased function. The left ventricle demonstrates regional wall motion abnormalities (see scoring diagram/findings for description). Left ventricular diastolic parameters were normal.   2. Right ventricular systolic function is normal. The right ventricular size is normal.   3. The mitral valve is normal in structure. Mild to moderate mitral valve regurgitation. No evidence of mitral stenosis.   4. Tricuspid valve regurgitation is mild to moderate.   5. The aortic valve is normal in structure. Aortic valve regurgitation is not visualized. No aortic stenosis is present.   6. The inferior vena cava is normal in size with greater than 50% respiratory variability, suggesting right atrial pressure of 3 mmHg.    Bilateral carotid artery stenosis  EKG 7/21: Nonspecific interventricular conduction delay  MPS 10/2018: LVEF= 45%  FINDINGS:  Regional wall motion:  demonstrates  hypokinesis of the inferior wall.  The overall quality of the study is fair.   Artifacts noted: no   Left ventricular cavity: normal.     Neuro/Psych neg Seizures PSYCHIATRIC DISORDERS (cleared by psychiatry for recent suggestions of suicidal ideation)  Depression     Neuromuscular disease (diabetic neuropathy)    GI/Hepatic negative GI ROS,,,(+) neg Cirrhosis    substance abuse  alcohol  use and marijuana use  Endo/Other  diabetes, Well Controlled, Type 2, Insulin  Dependent    Renal/GU Renal diseaseAKI improved     Musculoskeletal  (+) Arthritis ,    Abdominal Normal abdominal exam  (+)   Peds  Hematology negative hematology ROS (+)   Anesthesia Other Findings Past Medical History: 04/19/2010: Acute ST elevation myocardial infarction (STEMI) of  inferior wall (HCC)     Comment:  a.) transfered from [facility] --> LHC/PCI (very               difficult procedure) --> 3.0 x 23 mm and 3.0 x 12 mm               Xience stents to RCA No date: Allergies No date: Arthritis No date: Benign essential hypertension 05/08/2021: Bilateral carotid artery disease (HCC)     Comment:  a.) carotid doppler 05/08/2021: 1-39% BICA 04/19/2010: CAD (coronary artery disease)     Comment:  a.) inferior STEMI 04/19/2010 --> LHC/PCI: 50-70% pD1,               80% pRI, 90/90/90% RCA (overlapping 3.0 x 23 and 3.0 x 12              mm Xience DES); b.) MV 11/10/2018: fixed minimally               reversible inferior perfusion defect No  date: Cellulitis of foot No date: Chronic HFrEF (heart failure with reduced ejection fraction)  (HCC) No date: DDD (degenerative disc disease), cervical No date: Diabetes mellitus type 2, insulin  dependent (HCC) No date: Diverticulosis No date: Full dentures No date: Gout No date: Hard of hearing 2022: History of bilateral cataract extraction No date: History of ETOH abuse No date: Hyperlipidemia 04/19/2010: Ischemic cardiomyopathy     Comment:  a.) TTE 04/19/2010: 40%; b.) TTE 04/20/2014: EF >55%;               c.) TTE 11/10/2018: EF 45%; d.) TTE  11/14/2022: EF               25-30%; e. 06/2023 Echo: EF 25-30%, nl RV size/fxn. Mild               MR. Mild-mod TR. Ao sclerosis w/o stenosis. No date: Long term current use of aspirin  No date: Long term current use of clopidogrel  No date: Lumbar degenerative disc disease No date: Lumbar radiculopathy No date: Lumbar vertebral fracture (chronic superior endplate of L1) 01/15/2023: NSTEMI (non-ST elevated myocardial infarction) (HCC) No date: OSA (obstructive sleep apnea)     Comment:  a.) unable to tolerate nocturnal PAP therapy No date: Peripheral artery disease (HCC)     Comment:  a.) stenting 05/14/21: 12 mm x 12 cm LifeStent RIGHT dis               SFA/prox pop; b.) s/p cath directed thrombolysis RIGHT               SFA/pop 06/14/21; c.) s/p mech thrombectomy + stenting               06/15/21: 8 mm x 25cm & 8 mm x 7.5cm Viabahn; d.) s/p               BILAT CFA, profunda femoris, SFA endarterectomies +               fogarty embolectomy + stenting 11/15/22: 12mm x 58mm               Lifestream BILAT CIAs, 14 mm x 6 cm Lifestream & 13 mm x               5 cm Viabahn LEFT EIA No date: Peripheral neuropathy No date: Umbilical hernia   Reproductive/Obstetrics negative OB ROS                              Anesthesia Physical Anesthesia Plan  ASA: 3  Anesthesia Plan: General   Post-op Pain Management: Ofirmev  IV (intra-op)*   Induction: Intravenous  PONV Risk Score and Plan: 2 and Dexamethasone , Ondansetron  and Treatment may vary due to age or medical condition  Airway Management Planned: Oral ETT  Additional Equipment:   Intra-op Plan:   Post-operative Plan: Extubation in OR  Informed Consent: I have reviewed the patients History and Physical, chart, labs and discussed the procedure including the risks, benefits and alternatives for the proposed anesthesia with the patient or authorized representative who has indicated his/her understanding and  acceptance.   Patient has DNR.  Suspend DNR and Discussed DNR with patient.   Dental Advisory Given  Plan Discussed with: CRNA and Surgeon  Anesthesia Plan Comments:          Anesthesia Quick Evaluation

## 2023-12-11 NOTE — Anesthesia Postprocedure Evaluation (Signed)
 Anesthesia Post Note  Patient: Manuel [name]  Procedure(s) Performed: ENDARTERECTOMY, FEMORAL (Right) APPLICATION, WOUND VAC (Right)  Patient location during evaluation: PACU Anesthesia Type: General Level of consciousness: awake and alert, oriented and patient cooperative Pain management: pain level controlled Vital Signs Assessment: post-procedure vital signs reviewed and stable Respiratory status: spontaneous breathing, nonlabored ventilation and respiratory function stable Cardiovascular status: blood pressure returned to baseline and stable Postop Assessment: adequate PO intake Anesthetic complications: no   No notable events documented.   Last Vitals:  Vitals:   12/11/23 1745 12/11/23 1800  BP: (!) 145/79 (!) 149/88  Pulse: 71 74  Resp: 16 18  Temp:    SpO2: 100% 91%    Last Pain:  Vitals:   12/11/23 1042  TempSrc: Oral  PainSc: 8                  Ruthellen Tippy

## 2023-12-11 NOTE — Progress Notes (Signed)
 Date of Admission:  12/04/2023      ID: Manuel [name] is a 78 y.o. male  Principal Problem:   Rupture of femoral artery (HCC) Active Problems:   Ischemic cardiomyopathy   Coronary artery disease involving native coronary artery of native heart   Gram-negative bacteremia   Serratia infection   Vascular graft infection (HCC)  Underwent surgery today Right external iliac artery to profunda femoris bypass with autologous ipsilateral superficial femoral artery.  Resection of infected right common femoral artery and removal of common femoral artery profunda femoris and superficial femoral artery stents. Negative pressure dressing  Subjective: Pt is in recovery somnolent  Medications:   atorvastatin   80 mg Oral QHS   bisacodyl   10 mg Oral QHS   carvedilol   6.25 mg Oral BID WC   Chlorhexidine  Gluconate Cloth  6 each Topical Daily   feeding supplement  237 mL Oral BID BM   folic acid   1 mg Oral Daily   gabapentin   100 mg Oral QHS   insulin  aspart  0-15 Units Subcutaneous Q4H   insulin  glargine-yfgn  14 Units Subcutaneous Q2200   melatonin  5 mg Oral QHS   multivitamin with minerals  1 tablet Oral Daily   polyethylene glycol  17 g Oral BID   sodium chloride  flush  3 mL Intravenous Q12H   thiamine   100 mg Oral Daily   Or   thiamine   100 mg Intravenous Daily    Objective: Vital signs in last 24 hours: Patient Vitals for the past 24 hrs:  BP Temp Temp src Pulse Resp SpO2 Height Weight  12/11/23 1856 (!) 195/78 -- -- 71 (!) 23 100 % -- --  12/11/23 1845 (!) 150/85 (!) 97.5 F (36.4 C) Oral 73 18 96 % 5' 8 (1.727 m) 93.4 kg  12/11/23 1830 -- -- -- 69 17 100 % -- --  12/11/23 1815 (!) 157/83 -- -- 67 (!) 0 99 % -- --  12/11/23 1800 (!) 149/88 -- -- 74 18 91 % -- --  12/11/23 1745 (!) 145/79 -- -- 71 16 100 % -- --  12/11/23 1737 -- -- -- 69 12 94 % -- --  12/11/23 1730 135/85 -- -- 72 14 98 % -- --  12/11/23 1722 131/74 (!) 97.4 F (36.3 C) -- 65 10 100 % -- --  12/11/23  1042 (!) 161/95 97.9 F (36.6 C) Oral 78 18 96 % -- --  12/11/23 0924 (!) 154/98 98.7 F (37.1 C) -- 73 18 100 % -- --  12/11/23 0545 (!) 139/90 98.6 F (37 C) -- 72 16 100 % -- --  12/10/23 2023 (!) 178/86 98 F (36.7 C) -- 77 16 92 % -- --       PHYSICAL EXAM:  General: sedated pale Lungs: b/l air entry Heart: Regular rate and rhythm, no murmur, rub or gallop. Extremities: Right groin area has wound VAC Neurologic: Cannot assess  Lab Results    Latest Ref Rng & Units 12/11/2023    6:08 PM 12/11/2023    5:07 AM 12/10/2023    6:08 AM  CBC  WBC 4.0 - 10.5 K/uL 10.5  7.0  7.5   Hemoglobin 13.0 - 17.0 g/dL 89.4  88.8  88.9   Hematocrit 39.0 - 52.0 % 31.3  33.6  32.8   Platelets 150 - 400 K/uL 322  260  248        Latest Ref Rng & Units 12/11/2023    5:07 AM  12/10/2023    6:08 AM 12/09/2023    5:23 AM  CMP  Glucose 70 - 99 mg/dL 884  871  86   BUN 8 - 23 mg/dL 16  18  22    Creatinine 0.61 - 1.24 mg/dL 9.14  9.07  8.98   Sodium 135 - 145 mmol/L 137  137  136   Potassium 3.5 - 5.1 mmol/L 4.2  4.0  3.5   Chloride 98 - 111 mmol/L 108  106  104   CO2 22 - 32 mmol/L 22  22  24    Calcium  8.9 - 10.3 mg/dL 8.4  8.4  8.3       Microbiology: 12/04/23 BC X 2 serratia 11/28/23 BC- NG  so far    Assessment/Plan:  Right common femoral artery graft with rupture and leaking into the cutaneous tissue forming a hematoma.  Was taken for surgery underwent recent excision of the old graft and portions of the femoral artery and has a bypass graft with autologous external femoral artery.  Multiple cultures have been sent.  Serratia bacteremia.  Source is likely the right groin bovine graft.  Patient is on cefepime    There was Serratia from the wound in October 2024 was resistant to ceftriaxone  but the blood culture is currently susceptible to it. On cefepime .  Repeat blood culture has been neg so far   History of bilateral endarterectomies of the right And left common femoral artery,  profunda artery and superficial femoral artery, Fogarty embolectomy of right SFA, stent placement bilateral common iliac arteries, left external artery and right PTA on 11/15/2022.  Also had bovine pericardial patch angioplasties.  This was complicated by wound dehiscence on the right groin in September 2024.  Underwent debridement superficial culture from the right groin in October 2024 was Serratia along with Staphylococcus aureus and he received 14 days of appropriate antibiotics.  At that time the endovascular graft infection was not a concern  Right BKA  Left leg edema with superficial wounds and possible cellulitis He has to keep the leg elevated when he is sitting.    CAD Diabetes mellitus  Discussed the management with vascular surgeon

## 2023-12-11 NOTE — Consult Note (Signed)
 PHARMACY - ANTICOAGULATION CONSULT NOTE  Pharmacy Consult for IV Heparin  Indication: chest pain/ACS  Patient Measurements: Height: 5' 8 (172.7 cm) Weight: 81.6 kg (180 lb) IBW/kg (Calculated) : 68.4 HEPARIN  DW (KG): 81.6  Labs: Recent Labs    12/09/23 0523 12/10/23 0608 12/11/23 0507  HGB 10.9* 11.0* 11.1*  HCT 31.5* 32.8* 33.6*  PLT 211 248 260  HEPARINUNFRC 0.51 0.52 0.56  CREATININE 1.01 0.92 0.85    Estimated Creatinine Clearance: 67.1 mL/min (by C-G formula based on SCr of 0.85 mg/dL).   Medical History: Past Medical History:  Diagnosis Date   Acute ST elevation myocardial infarction (STEMI) of inferior wall (HCC) 04/19/2010   a.) transfered from [facility] to [facility] --> LHC/PCI (very difficult procedure) --> 3.0 x 23 mm and 3.0 x 12 mm Xience stents to RCA   Allergies    Arthritis    Benign essential hypertension    Bilateral carotid artery disease (HCC) 05/08/2021   a.) carotid doppler 05/08/2021: 1-39% BICA   CAD (coronary artery disease) 04/19/2010   a.) inferior STEMI 04/19/2010 --> LHC/PCI: 50-70% pD1, 80% pRI, 90/90/90% RCA (overlapping 3.0 x 23 and 3.0 x 12 mm Xience DES); b.) MV 11/10/2018: fixed minimally reversible inferior perfusion defect   Cellulitis of foot    Chronic HFrEF (heart failure with reduced ejection fraction) (HCC)    DDD (degenerative disc disease), cervical    Diabetes mellitus type 2, insulin  dependent (HCC)    Diverticulosis    Full dentures    Gout    Hard of hearing    History of bilateral cataract extraction 2022   History of ETOH abuse    Hyperlipidemia    Ischemic cardiomyopathy 04/19/2010   a.) TTE 04/19/2010: 40%; b.) TTE 04/20/2014: EF >55%; c.) TTE 11/10/2018: EF 45%; d.) TTE 11/14/2022: EF 25-30%; e. 06/2023 Echo: EF 25-30%, nl RV size/fxn. Mild MR. Mild-mod TR. Ao sclerosis w/o stenosis.   Long term current use of aspirin     Long term current use of clopidogrel     Lumbar degenerative disc disease    Lumbar radiculopathy     Lumbar vertebral fracture (chronic superior endplate of L1)    NSTEMI (non-ST elevated myocardial infarction) (HCC) 01/15/2023   OSA (obstructive sleep apnea)    a.) unable to tolerate nocturnal PAP therapy   Peripheral artery disease (HCC)    a.) stenting 05/14/21: 12 mm x 12 cm LifeStent RIGHT dis SFA/prox pop; b.) s/p cath directed thrombolysis RIGHT SFA/pop 06/14/21; c.) s/p mech thrombectomy + stenting 06/15/21: 8 mm x 25cm & 8 mm x 7.5cm Viabahn; d.) s/p BILAT CFA, profunda femoris, SFA endarterectomies + fogarty embolectomy + stenting 11/15/22: 12mm x 58mm Lifestream BILAT CIAs, 14 mm x 6 cm Lifestream & 13 mm x 5 cm Viabahn LEFT EIA   Peripheral neuropathy    Umbilical hernia     Medications:  No anticoagulation prior to admission per my chart review. Takes ASA / Plavix  at home  Assessment: 78 y/o M with medical history as above and including CAD, PAD presenting to the ED 8/14 with right common femoral artery hematoma with active extravasation. Patient underwent endovascular intervention same day. However, clinical picture complicated by rising troponin (715 >> 6596). Pharmacy consulted to dose heparin . IV heparin  was stopped this morning for bleeding in groin; vascular surgery now wants to resume  Baseline Labs aPTT 55s and INR 1.3  Baseline H&H, platelets are within normal limits.  8/17:  HL @ 0538 = 0.27, SUBtherapeutic  8/17 1233 HL  0.52,  therapeutic x1 8/17 1951 HL 0.42, therapeutic x2 8/18 0553 HL 0.50, therapeutic X 3 8/19 0523 HL 0.51, therapeutic x 4 8/20 0608 HL 0.52, therapeutic x 5 8/21 0507 HL 0.56, therapeutic x 6  Goal of Therapy:  Heparin  level 0.3-0.7 units/ml Monitor platelets by anticoagulation protocol: Yes   Plan:  Continue heparin  infusion at 2050 units/hr. Daily CBC, heparin  level. Monitor for signs/symptoms of bleeding.  Rankin CANDIE Dills, PharmD, [facility] 12/11/2023 6:12 AM

## 2023-12-12 ENCOUNTER — Other Ambulatory Visit: Payer: Self-pay

## 2023-12-12 ENCOUNTER — Inpatient Hospital Stay

## 2023-12-12 ENCOUNTER — Inpatient Hospital Stay: Admitting: Anesthesiology

## 2023-12-12 ENCOUNTER — Encounter
Admission: EM | Disposition: A | Discharge status: Skilled Nursing Facility | Payer: Self-pay | Source: Home / Self Care | Attending: Internal Medicine

## 2023-12-12 ENCOUNTER — Encounter: Payer: Self-pay | Admitting: Vascular Surgery

## 2023-12-12 DIAGNOSIS — I97638 Postprocedural hematoma of a circulatory system organ or structure following other circulatory system procedure: Secondary | ICD-10-CM | POA: Diagnosis not present

## 2023-12-12 DIAGNOSIS — R7881 Bacteremia: Secondary | ICD-10-CM | POA: Diagnosis not present

## 2023-12-12 DIAGNOSIS — Z9889 Other specified postprocedural states: Secondary | ICD-10-CM | POA: Diagnosis not present

## 2023-12-12 DIAGNOSIS — T81718A Complication of other artery following a procedure, not elsewhere classified, initial encounter: Secondary | ICD-10-CM | POA: Diagnosis not present

## 2023-12-12 DIAGNOSIS — I772 Rupture of artery: Secondary | ICD-10-CM | POA: Diagnosis not present

## 2023-12-12 DIAGNOSIS — R571 Hypovolemic shock: Secondary | ICD-10-CM | POA: Diagnosis not present

## 2023-12-12 DIAGNOSIS — R578 Other shock: Secondary | ICD-10-CM

## 2023-12-12 HISTORY — PX: FEMORAL ARTERY EXPLORATION: SHX5160

## 2023-12-12 HISTORY — PX: APPLICATION OF WOUND VAC: SHX5189

## 2023-12-12 HISTORY — PX: HEMATOMA EVACUATION: SHX5118

## 2023-12-12 LAB — BASIC METABOLIC PANEL WITH GFR
Anion gap: 7 (ref 5–15)
BUN: 20 mg/dL (ref 8–23)
CO2: 22 mmol/L (ref 22–32)
Calcium: 8 mg/dL — ABNORMAL LOW (ref 8.9–10.3)
Chloride: 106 mmol/L (ref 98–111)
Creatinine, Ser: 1 mg/dL (ref 0.61–1.24)
GFR, Estimated: >60 mL/min (ref >60)
Glucose, Bld: 122 mg/dL — ABNORMAL HIGH (ref 70–99)
Potassium: 4.7 mmol/L (ref 3.5–5.1)
Sodium: 135 mmol/L (ref 135–145)

## 2023-12-12 LAB — BLOOD GAS, ARTERIAL
Acid-base deficit: 3 mmol/L — ABNORMAL HIGH (ref 0.0–2.0)
Bicarbonate: 21.1 mmol/L (ref 20.0–28.0)
FIO2: 60 %
MECHVT: 450 mL
Mechanical Rate: 18
O2 Saturation: 100 %
PEEP: 5 cmH2O
Patient temperature: 37
pCO2 arterial: 34 mmHg (ref 32–48)
pH, Arterial: 7.4 (ref 7.35–7.45)
pO2, Arterial: 179 mmHg — ABNORMAL HIGH (ref 83–108)

## 2023-12-12 LAB — HEPARIN LEVEL (UNFRACTIONATED): Heparin Unfractionated: 0.34 [IU]/mL (ref 0.30–0.70)

## 2023-12-12 LAB — HEMOGLOBIN AND HEMATOCRIT, BLOOD
HCT: 30.3 % — ABNORMAL LOW (ref 39.0–52.0)
HCT: 32.3 % — ABNORMAL LOW (ref 39.0–52.0)
HCT: 29.5 % — ABNORMAL LOW (ref 39.0–52.0)
Hemoglobin: 10.3 g/dL — ABNORMAL LOW (ref 13.0–17.0)
Hemoglobin: 11 g/dL — ABNORMAL LOW (ref 13.0–17.0)
Hemoglobin: 9.9 g/dL — ABNORMAL LOW (ref 13.0–17.0)

## 2023-12-12 LAB — RENAL FUNCTION PANEL
Albumin: 2.5 g/dL — ABNORMAL LOW (ref 3.5–5.0)
Anion gap: 6 (ref 5–15)
BUN: 20 mg/dL (ref 8–23)
CO2: 22 mmol/L (ref 22–32)
Calcium: 7.9 mg/dL — ABNORMAL LOW (ref 8.9–10.3)
Chloride: 107 mmol/L (ref 98–111)
Creatinine, Ser: 1.03 mg/dL (ref 0.61–1.24)
GFR, Estimated: >60 mL/min (ref >60)
Glucose, Bld: 125 mg/dL — ABNORMAL HIGH (ref 70–99)
Phosphorus: 3.8 mg/dL (ref 2.5–4.6)
Potassium: 5.1 mmol/L (ref 3.5–5.1)
Sodium: 135 mmol/L (ref 135–145)

## 2023-12-12 LAB — LACTIC ACID, PLASMA
Lactic Acid, Venous: 1.1 mmol/L (ref 0.5–1.9)
Lactic Acid, Venous: 1 mmol/L (ref 0.5–1.9)

## 2023-12-12 LAB — GLUCOSE, CAPILLARY
Glucose-Capillary: 114 mg/dL — ABNORMAL HIGH (ref 70–99)
Glucose-Capillary: 120 mg/dL — ABNORMAL HIGH (ref 70–99)
Glucose-Capillary: 144 mg/dL — ABNORMAL HIGH (ref 70–99)
Glucose-Capillary: 171 mg/dL — ABNORMAL HIGH (ref 70–99)
Glucose-Capillary: 187 mg/dL — ABNORMAL HIGH (ref 70–99)
Glucose-Capillary: 176 mg/dL — ABNORMAL HIGH (ref 70–99)

## 2023-12-12 LAB — CBC
HCT: 24.2 % — ABNORMAL LOW (ref 39.0–52.0)
Hemoglobin: 8.1 g/dL — ABNORMAL LOW (ref 13.0–17.0)
MCH: 29.5 pg (ref 26.0–34.0)
MCHC: 33.5 g/dL (ref 30.0–36.0)
MCV: 88 fL (ref 80.0–100.0)
Platelets: 270 K/uL (ref 150–400)
RBC: 2.75 MIL/uL — ABNORMAL LOW (ref 4.22–5.81)
RDW: 16.8 % — ABNORMAL HIGH (ref 11.5–15.5)
WBC: 12.5 K/uL — ABNORMAL HIGH (ref 4.0–10.5)
nRBC: 0 % (ref 0.0–0.2)

## 2023-12-12 LAB — PROTIME-INR
INR: 1.3 — ABNORMAL HIGH (ref 0.8–1.2)
Prothrombin Time: 16.8 s — ABNORMAL HIGH (ref 11.4–15.2)

## 2023-12-12 LAB — FIBRINOGEN: Fibrinogen: 223 mg/dL (ref 210–475)

## 2023-12-12 LAB — TROPONIN I (HIGH SENSITIVITY)
Troponin I (High Sensitivity): 328 ng/L (ref <18)
Troponin I (High Sensitivity): 320 ng/L (ref <18)

## 2023-12-12 LAB — PREPARE RBC (CROSSMATCH)

## 2023-12-12 LAB — APTT: aPTT: 33 s (ref 24–36)

## 2023-12-12 SURGERY — EVACUATION HEMATOMA
Anesthesia: General | Site: Groin | Laterality: Right

## 2023-12-12 MED ORDER — OXIDIZED CELLULOSE EX PADS
MEDICATED_PAD | CUTANEOUS | Status: DC | PRN
  Administered 2023-12-12: 2 via TOPICAL

## 2023-12-12 MED ORDER — DOCUSATE SODIUM 50 MG/5ML PO LIQD
100.0000 mg | Freq: Two times a day (BID) | ORAL | Status: DC
  Filled 2023-12-12: qty 10

## 2023-12-12 MED ORDER — OXIDIZED CELLULOSE EX PADS
MEDICATED_PAD | CUTANEOUS | Status: DC | PRN
  Administered 2023-12-12: 1 via TOPICAL

## 2023-12-12 MED ORDER — ORAL CARE MOUTH RINSE
15.0000 mL | OROMUCOSAL | Status: DC
  Administered 2023-12-12 – 2023-12-13 (×6): 15 mL via OROMUCOSAL

## 2023-12-12 MED ORDER — FENTANYL BOLUS VIA INFUSION: 25.0000 ug | INTRAVENOUS | Status: DC | PRN

## 2023-12-12 MED ORDER — LABETALOL HCL 5 MG/ML IV SOLN
10.0000 mg | INTRAVENOUS | Status: DC | PRN
  Administered 2023-12-12: 10 mg via INTRAVENOUS
  Filled 2023-12-12: qty 4

## 2023-12-12 MED ORDER — PROPOFOL 1000 MG/100ML IV EMUL
INTRAVENOUS | Status: AC
  Filled 2023-12-12: qty 100

## 2023-12-12 MED ORDER — HYDRALAZINE HCL 20 MG/ML IJ SOLN
10.0000 mg | Freq: Four times a day (QID) | INTRAMUSCULAR | Status: DC | PRN
  Administered 2023-12-12: 10 mg via INTRAVENOUS
  Filled 2023-12-12: qty 1

## 2023-12-12 MED ORDER — HEPARIN SODIUM (PORCINE) 5000 UNIT/ML IJ SOLN
INTRAMUSCULAR | Status: AC
  Filled 2023-12-12: qty 1

## 2023-12-12 MED ORDER — PROPOFOL 500 MG/50ML IV EMUL
INTRAVENOUS | Status: DC | PRN
  Administered 2023-12-12: 50 ug/kg/min via INTRAVENOUS

## 2023-12-12 MED ORDER — FENTANYL CITRATE (PF) 100 MCG/2ML IJ SOLN
INTRAMUSCULAR | Status: AC
  Filled 2023-12-12: qty 2

## 2023-12-12 MED ORDER — SODIUM CHLORIDE 0.9% IV SOLUTION: Freq: Once | INTRAVENOUS | Status: DC

## 2023-12-12 MED ORDER — DEXAMETHASONE SODIUM PHOSPHATE 10 MG/ML IJ SOLN
INTRAMUSCULAR | Status: DC | PRN
  Administered 2023-12-12: 10 mg via INTRAVENOUS

## 2023-12-12 MED ORDER — GENTAMICIN SULFATE 40 MG/ML IJ SOLN
INTRAMUSCULAR | Status: AC
  Filled 2023-12-12: qty 2

## 2023-12-12 MED ORDER — PROPOFOL 10 MG/ML IV BOLUS
INTRAVENOUS | Status: DC | PRN
  Administered 2023-12-12: 150 mg via INTRAVENOUS

## 2023-12-12 MED ORDER — ROCURONIUM BROMIDE 100 MG/10ML IV SOLN
INTRAVENOUS | Status: DC | PRN
Start: 2023-12-12 — End: 2023-12-12
  Administered 2023-12-12: 20 mg via INTRAVENOUS
  Administered 2023-12-12: 40 mg via INTRAVENOUS
  Administered 2023-12-12: 20 mg via INTRAVENOUS

## 2023-12-12 MED ORDER — VANCOMYCIN HCL 1000 MG IV SOLR
INTRAVENOUS | Status: AC
  Filled 2023-12-12: qty 20

## 2023-12-12 MED ORDER — ONDANSETRON HCL 4 MG/2ML IJ SOLN
INTRAMUSCULAR | Status: AC
  Filled 2023-12-12: qty 2

## 2023-12-12 MED ORDER — PHENYLEPHRINE HCL-NACL 20-0.9 MG/250ML-% IV SOLN
INTRAVENOUS | Status: AC
  Filled 2023-12-12: qty 250

## 2023-12-12 MED ORDER — LIDOCAINE HCL (PF) 2 % IJ SOLN
INTRAMUSCULAR | Status: AC
Start: 2023-12-12 — End: 2023-12-12
  Filled 2023-12-12: qty 5

## 2023-12-12 MED ORDER — LIDOCAINE HCL (CARDIAC) PF 100 MG/5ML IV SOSY
PREFILLED_SYRINGE | INTRAVENOUS | Status: DC | PRN
  Administered 2023-12-12: 100 mg via INTRAVENOUS

## 2023-12-12 MED ORDER — CEFAZOLIN SODIUM-DEXTROSE 2-4 GM/100ML-% IV SOLN: 2.0000 g | INTRAVENOUS | Status: DC

## 2023-12-12 MED ORDER — CEFAZOLIN SODIUM-DEXTROSE 2-3 GM-%(50ML) IV SOLR
INTRAVENOUS | Status: DC | PRN
  Administered 2023-12-12: 2 g via INTRAVENOUS

## 2023-12-12 MED ORDER — PHENYLEPHRINE HCL-NACL 20-0.9 MG/250ML-% IV SOLN: 25.0000 ug/min | INTRAVENOUS | Status: DC

## 2023-12-12 MED ORDER — FENTANYL CITRATE (PF) 100 MCG/2ML IJ SOLN
INTRAMUSCULAR | Status: DC | PRN
  Administered 2023-12-12 (×2): 50 ug via INTRAVENOUS

## 2023-12-12 MED ORDER — SODIUM CHLORIDE 0.9% IV SOLUTION: Freq: Once | INTRAVENOUS | Status: AC

## 2023-12-12 MED ORDER — FUROSEMIDE 10 MG/ML IJ SOLN
20.0000 mg | Freq: Once | INTRAMUSCULAR | Status: AC
  Administered 2023-12-12: 20 mg via INTRAVENOUS
  Filled 2023-12-12: qty 2

## 2023-12-12 MED ORDER — VISTASEAL 10 ML SINGLE DOSE KIT
PACK | CUTANEOUS | Status: DC | PRN
  Administered 2023-12-12: 10 mL via TOPICAL

## 2023-12-12 MED ORDER — LACTATED RINGERS IV SOLN
INTRAVENOUS | Status: DC | PRN
Start: 2023-12-12 — End: 2023-12-12

## 2023-12-12 MED ORDER — EPHEDRINE SULFATE-NACL 50-0.9 MG/10ML-% IV SOSY
PREFILLED_SYRINGE | INTRAVENOUS | Status: DC | PRN
  Administered 2023-12-12: 25 mg via INTRAVENOUS

## 2023-12-12 MED ORDER — ORAL CARE MOUTH RINSE: 15.0000 mL | OROMUCOSAL | Status: DC | PRN

## 2023-12-12 MED ORDER — CLOPIDOGREL BISULFATE 75 MG PO TABS
75.0000 mg | ORAL_TABLET | Freq: Every day | ORAL | Status: DC
  Administered 2023-12-14 – 2024-01-01 (×17): 75 mg via ORAL
  Filled 2023-12-12 (×19): qty 1

## 2023-12-12 MED ORDER — DOCUSATE SODIUM 100 MG PO CAPS
100.0000 mg | ORAL_CAPSULE | Freq: Two times a day (BID) | ORAL | Status: DC
  Administered 2023-12-12 – 2023-12-17 (×7): 100 mg via ORAL
  Filled 2023-12-12 (×8): qty 1

## 2023-12-12 MED ORDER — DEXAMETHASONE SODIUM PHOSPHATE 10 MG/ML IJ SOLN
INTRAMUSCULAR | Status: AC
  Filled 2023-12-12: qty 1

## 2023-12-12 MED ORDER — ENOXAPARIN SODIUM 40 MG/0.4ML IJ SOSY
40.0000 mg | PREFILLED_SYRINGE | INTRAMUSCULAR | Status: DC
  Administered 2023-12-14 – 2024-01-01 (×16): 40 mg via SUBCUTANEOUS
  Filled 2023-12-12 (×19): qty 0.4

## 2023-12-12 MED ORDER — LACTATED RINGERS IV SOLN: INTRAVENOUS | Status: DC

## 2023-12-12 MED ORDER — PROPOFOL 10 MG/ML IV BOLUS
INTRAVENOUS | Status: AC
  Filled 2023-12-12: qty 20

## 2023-12-12 MED ORDER — ROCURONIUM BROMIDE 10 MG/ML (PF) SYRINGE
PREFILLED_SYRINGE | INTRAVENOUS | Status: AC
  Filled 2023-12-12: qty 10

## 2023-12-12 MED ORDER — FENTANYL 2500MCG IN NS 250ML (10MCG/ML) PREMIX INFUSION: 0.0000 ug/h | INTRAVENOUS | Status: DC

## 2023-12-12 MED ORDER — SODIUM CHLORIDE 0.9 % IV SOLN: 250.0000 mL | INTRAVENOUS | Status: AC

## 2023-12-12 MED ORDER — HYDROCODONE-ACETAMINOPHEN 5-325 MG PO TABS
1.0000 | ORAL_TABLET | Freq: Four times a day (QID) | ORAL | Status: DC | PRN
  Administered 2023-12-12 (×2): 1 via ORAL
  Administered 2023-12-13 – 2023-12-14 (×5): 2 via ORAL
  Administered 2023-12-14: 1 via ORAL
  Administered 2023-12-14 – 2023-12-17 (×7): 2 via ORAL
  Administered 2023-12-17: 1 via ORAL
  Administered 2023-12-18 – 2023-12-20 (×9): 2 via ORAL
  Administered 2023-12-21: 1 via ORAL
  Administered 2023-12-22 – 2023-12-26 (×6): 2 via ORAL
  Administered 2023-12-27: 1 via ORAL
  Administered 2023-12-27 (×2): 2 via ORAL
  Administered 2023-12-28: 1 via ORAL
  Administered 2023-12-29 – 2024-01-01 (×6): 2 via ORAL
  Filled 2023-12-12 (×7): qty 2
  Filled 2023-12-12: qty 1
  Filled 2023-12-12 (×11): qty 2
  Filled 2023-12-12: qty 1
  Filled 2023-12-12 (×13): qty 2
  Filled 2023-12-12: qty 1
  Filled 2023-12-12 (×8): qty 2

## 2023-12-12 MED ORDER — SODIUM CHLORIDE 0.9 % IV SOLN
12.5000 mg | Freq: Once | INTRAVENOUS | Status: DC | PRN
  Filled 2023-12-12 (×2): qty 0.5

## 2023-12-12 MED ORDER — CARVEDILOL 6.25 MG PO TABS
6.2500 mg | ORAL_TABLET | Freq: Two times a day (BID) | ORAL | Status: DC
  Administered 2023-12-12 – 2023-12-19 (×12): 6.25 mg via ORAL
  Filled 2023-12-12 (×13): qty 1

## 2023-12-12 MED ORDER — ACETAMINOPHEN 10 MG/ML IV SOLN: 1000.0000 mg | Freq: Once | INTRAVENOUS | Status: DC | PRN

## 2023-12-12 MED ORDER — FENTANYL CITRATE (PF) 100 MCG/2ML IJ SOLN: 25.0000 ug | INTRAMUSCULAR | Status: DC | PRN

## 2023-12-12 MED ORDER — SODIUM CHLORIDE 0.9 % IV SOLN: INTRAVENOUS | Status: DC

## 2023-12-12 MED ORDER — PHENYLEPHRINE HCL-NACL 20-0.9 MG/250ML-% IV SOLN
INTRAVENOUS | Status: DC | PRN
  Administered 2023-12-12: 50 ug/min via INTRAVENOUS

## 2023-12-12 MED ORDER — PHENYLEPHRINE HCL-NACL 20-0.9 MG/250ML-% IV SOLN: 0.0000 ug/min | INTRAVENOUS | Status: DC

## 2023-12-12 MED ORDER — DEXMEDETOMIDINE HCL IN NACL 400 MCG/100ML IV SOLN
0.0000 ug/kg/h | INTRAVENOUS | Status: DC
  Administered 2023-12-12: 0.4 ug/kg/h via INTRAVENOUS
  Filled 2023-12-12: qty 100

## 2023-12-12 MED ORDER — FENTANYL CITRATE PF 50 MCG/ML IJ SOSY
25.0000 ug | PREFILLED_SYRINGE | INTRAMUSCULAR | Status: DC | PRN
  Administered 2023-12-13 – 2023-12-17 (×6): 25 ug via INTRAVENOUS
  Filled 2023-12-12 (×7): qty 1

## 2023-12-12 MED ORDER — PROPOFOL 1000 MG/100ML IV EMUL
0.0000 ug/kg/min | INTRAVENOUS | Status: DC
  Filled 2023-12-12: qty 100

## 2023-12-12 MED ORDER — VISTASEAL 10 ML SINGLE DOSE KIT
PACK | CUTANEOUS | Status: AC
  Filled 2023-12-12: qty 10

## 2023-12-12 MED ORDER — ASPIRIN 81 MG PO TBEC
81.0000 mg | DELAYED_RELEASE_TABLET | Freq: Every day | ORAL | Status: DC
  Administered 2023-12-12 – 2024-01-01 (×18): 81 mg via ORAL
  Filled 2023-12-12 (×22): qty 1

## 2023-12-12 MED ORDER — CEFAZOLIN SODIUM 1 G IJ SOLR
INTRAMUSCULAR | Status: AC
  Filled 2023-12-12: qty 20

## 2023-12-12 MED ORDER — VASHE WOUND IRRIGATION OPTIME
TOPICAL | Status: DC | PRN
Start: 2023-12-12 — End: 2023-12-12
  Administered 2023-12-12: 34 [oz_av]

## 2023-12-12 MED ORDER — PANTOPRAZOLE SODIUM 40 MG IV SOLR
40.0000 mg | INTRAVENOUS | Status: DC
  Administered 2023-12-12 – 2023-12-15 (×4): 40 mg via INTRAVENOUS
  Filled 2023-12-12 (×4): qty 10

## 2023-12-12 MED ORDER — SODIUM CHLORIDE 0.9 % IR SOLN
Status: DC | PRN
  Administered 2023-12-12: 50 mL

## 2023-12-12 MED ORDER — PHENYLEPHRINE 80 MCG/ML (10ML) SYRINGE FOR IV PUSH (FOR BLOOD PRESSURE SUPPORT)
PREFILLED_SYRINGE | INTRAVENOUS | Status: AC
  Filled 2023-12-12: qty 10

## 2023-12-12 SURGICAL SUPPLY — 33 items
BAG DECANTER FOR FLEXI CONT (MISCELLANEOUS) IMPLANT
BASIN GRAD PLASTIC 32OZ STRL (MISCELLANEOUS) IMPLANT
CANISTER WOUND CARE 500ML ATS (WOUND CARE) IMPLANT
CLAMP SUTURE YELLOW 5 PAIRS (MISCELLANEOUS) IMPLANT
CLEANSER WND VASHE 34 (WOUND CARE) IMPLANT
DRAPE INCISE IOBAN 66X45 STRL (DRAPES) IMPLANT
DRESSING SURGICEL FIBRLLR 1X2 (HEMOSTASIS) IMPLANT
DRSG VAC GRANUFOAM MED (GAUZE/BANDAGES/DRESSINGS) IMPLANT
ELECT CAUTERY BLADE 6.4 (BLADE) IMPLANT
ELECTRODE REM PT RTRN 9FT ADLT (ELECTROSURGICAL) IMPLANT
GLOVE BIO SURGEON STRL SZ7 (GLOVE) IMPLANT
GOWN STRL REUS W/ TWL XL LVL3 (GOWN DISPOSABLE) IMPLANT
GOWN STRL REUS W/TWL 2XL LVL3 (GOWN DISPOSABLE) IMPLANT
HEMOSTAT SURGICEL 2X14 (HEMOSTASIS) IMPLANT
IV NS 500ML BAXH (IV SOLUTION) IMPLANT
KIT STIMULAN RAPID CURE 5CC (Orthopedic Implant) IMPLANT
KIT TURNOVER KIT A (KITS) IMPLANT
LABEL OR SOLS (LABEL) IMPLANT
LOOP VESSEL MAXI 1X406 RED (MISCELLANEOUS) IMPLANT
LOOP VESSEL MINI 0.8X406 BLUE (MISCELLANEOUS) IMPLANT
MANIFOLD NEPTUNE II (INSTRUMENTS) IMPLANT
NDL SAFETY ECLIPSE 18X1.5 (NEEDLE) IMPLANT
NS IRRIG 500ML POUR BTL (IV SOLUTION) IMPLANT
PACK BASIN MAJOR ARMC (MISCELLANEOUS) IMPLANT
PACK UNIVERSAL (MISCELLANEOUS) IMPLANT
PLEDGET CV PTFE 7X3 (MISCELLANEOUS) IMPLANT
SPONGE T-LAP 18X18 ~~LOC~~+RFID (SPONGE) IMPLANT
SUT PROLENE 5 0 RB 1 DA (SUTURE) IMPLANT
SUT SILK 3-0 18XBRD TIE 12 (SUTURE) IMPLANT
SUT SILK 4-0 18XBRD TIE 12 (SUTURE) IMPLANT
SUT VIC AB 2-0 CT1 TAPERPNT 27 (SUTURE) IMPLANT
SYR 5ML LL (SYRINGE) IMPLANT
TAPE UMBILICAL 1/8 X36 TWILL (MISCELLANEOUS) IMPLANT

## 2023-12-12 NOTE — Progress Notes (Signed)
 Date of Admission:  12/04/2023      ID: Manuel [name] is a 78 y.o. male  Principal Problem:   Rupture of femoral artery (HCC) Active Problems:   Ischemic cardiomyopathy   Coronary artery disease involving native coronary artery of native heart   Gram-negative bacteremia   Serratia infection   Vascular graft infection (HCC)  Underwent surgery yesterday Right external iliac artery to profunda femoris bypass with autologous ipsilateral superficial femoral artery.  Resection of infected right common femoral artery and removal of common femoral artery profunda femoris and superficial femoral artery stents. Negative pressure dressing  Subjective: Pt in icu Says he has some left upper quadrant abdominal pain- this was there last time after surgery - could be related to anesthesia On hi flo nasal cannula  Medications:   aspirin  EC  81 mg Oral Daily   atorvastatin   80 mg Oral QHS   bisacodyl   10 mg Oral QHS   Chlorhexidine  Gluconate Cloth  6 each Topical Daily   docusate  100 mg Per Tube BID   feeding supplement  237 mL Oral BID BM   folic acid   1 mg Oral Daily   gabapentin   100 mg Oral QHS   insulin  aspart  0-15 Units Subcutaneous Q4H   insulin  glargine-yfgn  14 Units Subcutaneous Q2200   multivitamin with minerals  1 tablet Oral Daily   mouth rinse  15 mL Mouth Rinse Q2H   pantoprazole  (PROTONIX ) IV  40 mg Intravenous Q24H   polyethylene glycol  17 g Oral BID   sodium chloride  flush  3 mL Intravenous Q12H   thiamine   100 mg Oral Daily   Or   thiamine   100 mg Intravenous Daily    Objective: Vital signs in last 24 hours: Patient Vitals for the past 24 hrs:  BP Temp Temp src Pulse Resp SpO2 Height Weight  12/12/23 1122 -- -- -- -- -- 97 % -- --  12/12/23 1100 -- (!) 95.5 F (35.3 C) -- 60 19 97 % -- --  12/12/23 1032 (!) 188/82 (!) 95.4 F (35.2 C) -- 60 19 97 % -- --  12/12/23 1000 -- (!) 95.2 F (35.1 C) -- 60 14 95 % -- --  12/12/23 0900 -- (!) 95.9 F (35.5 C)  -- (!) 52 18 100 % -- --  12/12/23 0814 -- -- -- (!) 54 18 95 % -- --  12/12/23 0810 -- -- -- -- -- 95 % -- --  12/12/23 0800 131/70 (!) 95.7 F (35.4 C) Rectal (!) 55 18 96 % -- --  12/12/23 0730 110/64 -- -- (!) 56 18 97 % -- --  12/12/23 0718 -- -- -- -- -- 96 % -- --  12/12/23 0715 (!) 101/58 -- -- (!) 58 18 96 % -- --  12/12/23 0713 -- -- -- (!) 58 18 96 % -- --  12/12/23 0528 -- 97.9 F (36.6 C) Oral 64 20 100 % -- --  12/12/23 0515 -- -- -- -- 19 -- -- --  12/12/23 0513 -- -- -- 63 10 95 % -- --  12/12/23 0510 -- 97.8 F (36.6 C) Oral 64 18 100 % -- --  12/12/23 0330 -- -- -- (!) 59 18 97 % -- --  12/12/23 0315 -- -- -- 62 (!) 25 95 % -- --  12/12/23 0301 -- -- -- 64 19 91 % -- --  12/12/23 0255 -- -- -- 65 18 95 % -- --  12/12/23  0245 -- -- -- 66 (!) 29 92 % -- --  12/12/23 0230 -- -- -- 64 (!) 24 99 % -- --  12/12/23 0215 -- -- -- (!) 58 (!) 8 95 % -- --  12/12/23 0200 120/65 -- -- 61 (!) 23 98 % -- --  12/12/23 0145 -- -- -- 62 17 95 % -- --  12/12/23 0135 -- -- -- 61 19 100 % -- --  12/12/23 0130 -- -- -- 61 (!) 9 98 % -- --  12/12/23 0115 -- -- -- 68 17 97 % -- --  12/12/23 0105 -- -- -- 68 17 99 % -- --  12/12/23 0100 135/79 -- -- 66 (!) 21 95 % -- --  12/12/23 0055 -- -- -- 66 19 96 % -- --  12/12/23 0054 -- -- -- 66 (!) 24 96 % -- --  12/12/23 0045 -- -- -- (!) 57 10 100 % -- --  12/12/23 0030 -- -- -- (!) 56 10 100 % -- --  12/12/23 0015 -- -- -- (!) 58 (!) 9 97 % -- --  12/12/23 0000 (!) 140/78 (!) 97.4 F (36.3 C) Oral (!) 58 (!) 9 100 % -- --  12/11/23 2345 -- -- -- (!) 59 14 100 % -- --  12/11/23 2330 -- -- -- 60 10 98 % -- --  12/11/23 2315 -- -- -- 61 19 99 % -- --  12/11/23 2310 -- -- -- 61 15 100 % -- --  12/11/23 2300 134/69 -- -- 62 20 100 % -- --  12/11/23 2245 -- -- -- 62 -- 99 % -- --  12/11/23 2230 (!) 117/47 -- -- (!) 59 (!) 25 100 % -- --  12/11/23 2215 (!) 118/53 -- -- (!) 58 17 99 % -- --  12/11/23 2206 -- -- -- 61 (!) 21 99 % -- --   12/11/23 2205 -- -- -- 61 (!) 40 100 % -- --  12/11/23 2138 -- -- -- -- (!) 25 -- -- --  12/11/23 2135 -- -- -- -- 14 -- -- --  12/11/23 2134 -- -- -- -- 15 -- -- --  12/11/23 2130 (!) 152/79 -- -- -- 17 -- -- --  12/11/23 2120 -- -- -- -- (!) 23 -- -- --  12/11/23 2115 (!) 144/78 -- -- 68 20 100 % -- --  12/11/23 2100 (!) 147/74 -- -- 67 (!) 21 100 % -- --  12/11/23 2030 134/74 -- -- 67 18 94 % -- --  12/11/23 2000 118/83 -- -- 64 19 100 % -- --  12/11/23 1945 134/71 -- -- 61 13 (!) 87 % -- --  12/11/23 1930 129/60 (!) 97.5 F (36.4 C) Oral (!) 58 (!) 24 100 % -- --  12/11/23 1915 (!) 89/43 -- -- (!) 58 14 96 % -- --  12/11/23 1900 (!) 158/86 -- -- 73 (!) 22 97 % -- --  12/11/23 1856 (!) 195/78 -- -- 71 (!) 23 100 % -- --  12/11/23 1845 (!) 150/85 (!) 97.5 F (36.4 C) Oral 73 18 96 % 5' 8 (1.727 m) 93.4 kg  12/11/23 1830 -- -- -- 69 17 100 % -- --  12/11/23 1815 (!) 157/83 -- -- 67 (!) 0 99 % -- --  12/11/23 1800 (!) 149/88 -- -- 74 18 91 % -- --  12/11/23 1745 (!) 145/79 -- -- 71 16 100 % -- --  12/11/23 1737 -- -- -- 69  12 94 % -- --  12/11/23 1730 135/85 -- -- 72 14 98 % -- --  12/11/23 1722 131/74 (!) 97.4 F (36.3 C) -- 65 10 100 % -- --       PHYSICAL EXAM:  General: awake, alert, pale Lungs: b/l air entry Heart: Regular rate and rhythm, no murmur, rub or gallop. Extremities: Right groin area has wound VAC Neurologic: moves all limbs  Lab Results    Latest Ref Rng & Units 12/12/2023    8:16 AM 12/12/2023    3:13 AM 12/11/2023    6:08 PM  CBC  WBC 4.0 - 10.5 K/uL  12.5  10.5   Hemoglobin 13.0 - 17.0 g/dL 89.6  8.1  89.4   Hematocrit 39.0 - 52.0 % 30.3  24.2  31.3   Platelets 150 - 400 K/uL  270  322        Latest Ref Rng & Units 12/12/2023    8:16 AM 12/12/2023    3:13 AM 12/11/2023    5:07 AM  CMP  Glucose 70 - 99 mg/dL 874  877  884   BUN 8 - 23 mg/dL 20  20  16    Creatinine 0.61 - 1.24 mg/dL 8.96  8.99  9.14   Sodium 135 - 145 mmol/L 135  135  137    Potassium 3.5 - 5.1 mmol/L 5.1  4.7  4.2   Chloride 98 - 111 mmol/L 107  106  108   CO2 22 - 32 mmol/L 22  22  22    Calcium  8.9 - 10.3 mg/dL 7.9  8.0  8.4       Microbiology: 12/04/23 BC X 2 serratia 11/28/23 BC- NG  so far    Assessment/Plan:  Right common femoral artery graft with rupture and leaking into the cutaneous tissue forming a hematoma.  Was taken for surgery underwent recent excision of the old graft and portions of the femoral artery and has a bypass graft with autologous external femoral artery.  Multiple cultures have been sent. Gram neg rod in gram stain  Serratia bacteremia.  Source is likely the right groin bovine graft.  Patient is on cefepime    There was Serratia from the wound in October 2024 was resistant to ceftriaxone  but the blood culture is currently susceptible to it. On cefepime .  Repeat blood culture has been neg so far   History of bilateral endarterectomies of the right And left common femoral artery, profunda artery and superficial femoral artery, Fogarty embolectomy of right SFA, stent placement bilateral common iliac arteries, left external artery and right PTA on 11/15/2022.  Also had bovine pericardial patch angioplasties.  This was complicated by wound dehiscence on the right groin in September 2024.  Underwent debridement superficial culture from the right groin in October 2024 was Serratia along with Staphylococcus aureus and he received 14 days of appropriate antibiotics.  At that time the endovascular graft infection was not a concern  Right BKA  Left leg edema with superficial wounds and possible cellulitis He has to keep the leg elevated when he is sitting.    CAD Diabetes mellitus  Discussed the management with patient and wife  ID will not see him this weekend On call physician Available by phone for urgent issues

## 2023-12-12 NOTE — Anesthesia Preprocedure Evaluation (Addendum)
 Anesthesia Evaluation  Patient identified by MRN, date of birth, ID band Patient awake    Reviewed: Allergy & Precautions, NPO status , Patient's Chart, lab work & pertinent test results  History of Anesthesia Complications Negative for: history of anesthetic complications  Airway Mallampati: IV   Neck ROM: Full    Dental  (+) Edentulous Upper, Edentulous Lower   Pulmonary sleep apnea    Pulmonary exam normal breath sounds clear to auscultation       Cardiovascular hypertension, + CAD (s/p stents), + Peripheral Vascular Disease (s/p LE bypass 12/11/23 now with hematoma) and +CHF (EF 25-30%)  Normal cardiovascular exam Rhythm:Regular Rate:Normal     Neuro/Psych  PSYCHIATRIC DISORDERS  Depression    Alcohol  and marijuana use disorder    GI/Hepatic negative GI ROS,,,  Endo/Other  diabetes, Type 2, Insulin  Dependent  Obesity   Renal/GU Renal disease (AKI, improving)     Musculoskeletal  (+) Arthritis ,    Abdominal   Peds  Hematology  (+) Blood dyscrasia, anemia   Anesthesia Other Findings Cardiology note 11/18/23:  Coronary artery disease with unstable angina, non-STEMI Presenting July 09, 2023 with non-STEMI Peak troponin 1045 -Taken to the catheterization lab July 14, 2023 ,  Stenting x 3 to the LAD, residual proximal left circumflex disease, chronically occluded RCA -On aspirin  Plavix ,  Currently denies anginal symptoms  -Catheterization note indicates for refractory anginal symptoms could consider stenting to left circumflex Recommend we continue aspirin  81 daily with Plavix  75 daily, carvedilol  25 twice daily, Lipitor  80 daily   Cardiomyopathy, acute on chronic diastolic and systolic CHF Appears euvolemic, continue carvedilol , Farxiga , Lasix  40 daily, spironolactone  25 daily, Entresto  24/26 twice daily   PAD Multiple interventions in the past, right BKA December 2024 On Plavix , Lipitor  80 daily    Diabetes type 2 A1c 8.0, management per primary care   Hyperlipidemia Numbers well-controlled on Lipitor  80 daily   Essential hypertension Low blood pressure, recommend he continue to monitor numbers at home For systolic pressures less than 100 recommend he call our office   Reproductive/Obstetrics                              Anesthesia Physical Anesthesia Plan  ASA: 3 and emergent  Anesthesia Plan: General   Post-op Pain Management:    Induction: Intravenous  PONV Risk Score and Plan: 2 and Ondansetron , Dexamethasone  and Treatment may vary due to age or medical condition  Airway Management Planned: Oral ETT  Additional Equipment: Arterial line  Intra-op Plan:   Post-operative Plan: Extubation in OR and Possible Post-op intubation/ventilation  Informed Consent: I have reviewed the patients History and Physical, chart, labs and discussed the procedure including the risks, benefits and alternatives for the proposed anesthesia with the patient or authorized representative who has indicated his/her understanding and acceptance.     Dental advisory given  Plan Discussed with: CRNA  Anesthesia Plan Comments: (Wife at bedside for history and consent. Verified full code status and acceptance of blood products with patient. Patient consented for risks of anesthesia including but not limited to:  - adverse reactions to medications - damage to eyes, teeth, lips or other oral mucosa - nerve damage due to positioning  - sore throat or hoarseness - damage to heart, brain, nerves, lungs, other parts of body or loss of life  Informed patient about role of CRNA in peri- and intra-operative care.  Patient voiced understanding.)  Anesthesia Quick Evaluation

## 2023-12-12 NOTE — Progress Notes (Signed)
 Patient was doing well until approximately 1 hour ago when he began developing acute rapid bleeding into his VAC canister.  This occurred after moving for positioning in the ICU.  He has filled up to [facility] canisters which is equivalent to nearly 1 L of blood.  The ICU staff has done an excellent job and has held firm pressure for over a half an hour now.  The VAC was taken off of suction immediately on development of bleeding.  It is likely that he has had arterial bleeding from the site of his tenuous bypass anastomosis.  This is clearly a critical and both life and limb threatening situation.  Patient is awake and wife is at bedside.  We explained the grave nature of the situation to them this morning.  He will need to be taken back emergently to the operating room to try to repair bleeding.  There is a possibility we will have to ligate the bypass in which case he will have profound ischemia to the residual right lower extremity.  He is already status post right below-knee amputation.  There is a significant risk of exsanguination.  I see no other alternative however with this much large-volume bleeding less than 12 hours after an incredibly difficult and complicated surgical repair for patch blowout and infection.  This is clearly an acute life-threatening situation.  Immediate surgical exploration is warranted.

## 2023-12-12 NOTE — Consult Note (Signed)
 NAME:  Manuel [name], MRN:  [id number], DOB:  February 16, 1946, LOS: 8 ADMISSION DATE:  12/04/2023, CONSULTATION DATE:  12/12/2023 REFERRING MD:  Dr. [name], CHIEF COMPLAINT:  Hemorrhagic shock   Brief Pt Description / Synopsis:  78 y.o. male admitted 12/04/23 with right femoral artery rupture and critical stenosis at the ostium of the profundus femoris status post angioplasty and stent placement of the right profunda femoral and common femoral extending to the distal right external iliac artery, Sepsis due to Serratia Marcescens Bacteremia from infected bovine-graft, and NSTEMI with Cardiology recommending medical management.  Required removal of bovine patch and previous stents with reconstruction of femoral vein on 8/21.  Course complicated on 8/22 by Hemorrhagic shock due to large right groin hematoma and hemorrhage following right external iliac artery to profunda femoris artery bypass the day before.  Returns to ICU and remains intubated.  History of Present Illness:  Manuel [name] is a 78 y.o. 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 Patient fell with walker landing on top of him.  EMS was called for assistance.  Patient refused to be transported to ED for evaluation initial. However, throughout the day the patient noted more intense pain in right groin area and due to this EMS was called back and patient was transported to ED.  ED Course: Initial Vital Signs: 99.3/tmx 100.7 BP 131/72, hr 113, rr 19 sat 95%  Significant Labs: Wbc 11.6, hgb 14.3, plt 191, increase pmn, Na 126 ( 137), K 5.3, cl91, glu 438, cr 1.12 Lactic 2.9 ,3.3, Trop 284,3403, 8119 UA :rare  bacteria , ketones 20  Imaging Chest X-ray>>IMPRESSION: 1. No acute cardiopulmonary findings. 2. Stable cardiomegaly. 3.  Aortic Atherosclerosis  CT Head>>IMPRESSION: 1. Generalized cerebral atrophy and microvascular disease changes of the supratentorial brain. 2. No acute intracranial abnormality. CT Cervical Spine>>IMPRESSION: 1. No acute fracture or traumatic subluxation. 2. Marked severity multilevel degenerative changes, as described above. CT Chest/Abdomen/Pelvis>>IMPRESSION: 1. Large hematoma surrounding the right common femoral artery with active arterial extravasation of contrast into the hematoma. 2. 3.2 cm infrarenal abdominal aortic aneurysm. Recommend follow-up every 3 years. 3. Mild patchy peripheral ground-glass opacities in the anterior right upper lobe with some focal pleural thickening. Findings are nonspecific and may be infectious/inflammatory. 4. Gallbladder wall edema. No radiopaque calculi are seen. Correlate clinically for cholecystitis. 5. Nonobstructing 3 mm calculus in the distal right ureter. 6. Colonic diverticulosis. 7. Fatty infiltration of the liver. 8. Mild splenomegaly.   TRH asked to admit for further workup and treatment with Vascular Surgery consult for repair of right femoral artery rupture.  Please see Significant Hospital Events section below for full detailed hospital course.   Pertinent  Medical History   Past Medical History:  Diagnosis Date   Acute ST elevation myocardial infarction (STEMI) of inferior wall (HCC) 04/19/2010   a.) transfered from [facility] to [facility] --> LHC/PCI (very difficult procedure) --> 3.0 x 23 mm and 3.0 x 12 mm Xience stents to RCA   Allergies    Arthritis    Benign essential hypertension    Bilateral carotid artery disease (HCC) 05/08/2021   a.)  carotid doppler 05/08/2021: 1-39% BICA   CAD (coronary artery disease) 04/19/2010   a.) inferior STEMI 04/19/2010 --> LHC/PCI: 50-70% pD1, 80% pRI, 90/90/90% RCA  (overlapping 3.0 x 23 and 3.0 x 12 mm Xience DES); b.) MV 11/10/2018: fixed minimally reversible inferior perfusion defect   Cellulitis of foot    Chronic HFrEF (heart failure with reduced ejection fraction) (HCC)    DDD (degenerative disc disease), cervical    Diabetes mellitus type 2, insulin  dependent (HCC)    Diverticulosis    Full dentures    Gout    Hard of hearing    History of bilateral cataract extraction 2022   History of ETOH abuse    Hyperlipidemia    Ischemic cardiomyopathy 04/19/2010   a.) TTE 04/19/2010: 40%; b.) TTE 04/20/2014: EF >55%; c.) TTE 11/10/2018: EF 45%; d.) TTE 11/14/2022: EF 25-30%; e. 06/2023 Echo: EF 25-30%, nl RV size/fxn. Mild MR. Mild-mod TR. Ao sclerosis w/o stenosis.   Long term current use of aspirin     Long term current use of clopidogrel     Lumbar degenerative disc disease    Lumbar radiculopathy    Lumbar vertebral fracture (chronic superior endplate of L1)    NSTEMI (non-ST elevated myocardial infarction) (HCC) 01/15/2023   OSA (obstructive sleep apnea)    a.) unable to tolerate nocturnal PAP therapy   Peripheral artery disease (HCC)    a.) stenting 05/14/21: 12 mm x 12 cm LifeStent RIGHT dis SFA/prox pop; b.) s/p cath directed thrombolysis RIGHT SFA/pop 06/14/21; c.) s/p mech thrombectomy + stenting 06/15/21: 8 mm x 25cm & 8 mm x 7.5cm Viabahn; d.) s/p BILAT CFA, profunda femoris, SFA endarterectomies + fogarty embolectomy + stenting 11/15/22: 12mm x 58mm Lifestream BILAT CIAs, 14 mm x 6 cm Lifestream & 13 mm x 5 cm Viabahn LEFT EIA   Peripheral neuropathy    Umbilical hernia     Micro Data:  8/14: COVID/FLU/RSV PCR>> negative 8/14: Strep pneumo and Legionella urinary antigens>> negative 8/14: Blood cultures>> Serratia Marcescens 8/14: MRSA PCR>> negative 8/18: Repeat Blood cultures>> no growth to date 8/21: Surgical wound>>  Antimicrobials:   Anti-infectives (From admission, onward)    Start     Dose/Rate Route Frequency Ordered Stop    12/12/23 0536  ceFAZolin  (ANCEF ) IVPB 2g/100 mL premix  Status:  Discontinued        2 g 200 mL/hr over 30 Minutes Intravenous 30 min pre-op 12/12/23 0536 12/12/23 0703   12/11/23 1709  ceFAZolin  (ANCEF ) IVPB 2g/100 mL premix  Status:  Discontinued        2 g 200 mL/hr over 30 Minutes Intravenous 30 min pre-op 12/11/23 1709 12/11/23 1826   12/11/23 1556  vancomycin  (VANCOCIN ) powder  Status:  Discontinued          As needed 12/11/23 1556 12/11/23 1718   12/11/23 1555  gentamicin  (GARAMYCIN ) injection  Status:  Discontinued          As needed 12/11/23 1556 12/11/23 1718   12/11/23 1553  ceFEPIme  (MAXIPIME ) injection  Status:  Discontinued          As needed 12/11/23 1554 12/11/23 1718   12/11/23 1430  ceFEPIme  (MAXIPIME ) 2 g in sodium chloride  0.9 % 100 mL IVPB  Status:  Discontinued       Note to Pharmacy: Just powder please  MD to mix in OR   2 g 200 mL/hr over 30 Minutes Intravenous  Once 12/11/23 1420 12/11/23 1425   12/10/23 0900  ceFEPIme  (MAXIPIME ) 2 g  in sodium chloride  0.9 % 100 mL IVPB        2 g 200 mL/hr over 30 Minutes Intravenous Every 8 hours 12/10/23 0720     12/09/23 0900  ceFEPIme  (MAXIPIME ) 2 g in sodium chloride  0.9 % 100 mL IVPB  Status:  Discontinued        2 g 200 mL/hr over 30 Minutes Intravenous Every 12 hours 12/09/23 0758 12/10/23 0720   12/07/23 1800  cefTRIAXone  (ROCEPHIN ) 2 g in sodium chloride  0.9 % 100 mL IVPB  Status:  Discontinued        2 g 200 mL/hr over 30 Minutes Intravenous Every 24 hours 12/07/23 1419 12/09/23 0758   12/06/23 0300  vancomycin  (VANCOREADY) IVPB 1250 mg/250 mL  Status:  Discontinued        1,250 mg 166.7 mL/hr over 90 Minutes Intravenous Every 24 hours 12/05/23 0103 12/05/23 0713   12/05/23 0145  ceFEPIme  (MAXIPIME ) 2 g in sodium chloride  0.9 % 100 mL IVPB  Status:  Discontinued        2 g 200 mL/hr over 30 Minutes Intravenous Every 12 hours 12/05/23 0049 12/07/23 1419   12/05/23 0145  vancomycin  (VANCOREADY) IVPB 2000 mg/400 mL         2,000 mg 200 mL/hr over 120 Minutes Intravenous  Once 12/05/23 0049 12/05/23 0400   12/05/23 0100  azithromycin  (ZITHROMAX ) 500 mg in sodium chloride  0.9 % 250 mL IVPB  Status:  Discontinued        500 mg 250 mL/hr over 60 Minutes Intravenous Every 24 hours 12/05/23 0003 12/05/23 0713   12/05/23 0000  ceFAZolin  (ANCEF ) IVPB 1 g/50 mL premix  Status:  Discontinued       Note to Pharmacy: Send with pt to OR   1 g 100 mL/hr over 30 Minutes Intravenous On call 12/04/23 1814 12/04/23 1922   12/04/23 2030  ceFAZolin  (ANCEF ) IVPB 2g/100 mL premix  Status:  Discontinued        2 g 200 mL/hr over 30 Minutes Intravenous  Once 12/04/23 2025 12/04/23 2122   12/04/23 1911  ceFAZolin  (ANCEF ) IVPB 1 g/50 mL premix        over 30 Minutes  Continuous PRN 12/04/23 1923 12/04/23 2032       Significant Hospital Events: Including procedures, antibiotic start and stop dates in addition to other pertinent events   8/14: Vascular Surgery performed angioplasty and stent placement of the right profunda femoral and common femoral extending to the distal right external iliac artery.  TRH admitted. 8/15: Cardiology consulted for NSTEMI (HS troponin peaked at 8000), plans to medically manage.  No significant events. 8/16: No significant events, right groin pain and bleeding under control. 8/17:  Complaining of intermittent pain in the LUQ due to gas, no BM for 5 days, given Dulcolax suppository 8/18: ID consulted as blood cultures resulted with Serratia.  Constipation resolved following suppository yesterday. 8/19: No acute events, plan for Surgery on Thursday 8/21 for evacuation of hematoma and possible removal of graft. 8/20: No significant events, plan for surgery tomorrow to evacuate hematoma and remove graft. 8/21:  Underwent removal of bovine patch and previous stents with reconstruction of femoral artery. 8/22: Developed Hemorrhagic shock due to large right groin hematoma and hemorrhage following right  external iliac artery to profunda femoris artery bypass yesterday, take emergently to OR by Vascular Surgery for repair.   Returns to ICU and remains intubated, PCCM consulted for post-op ventilator management.  Interim History / Subjective:  As  outlined above under Significant Hospital Events section  Objective   Blood pressure 120/65, pulse 64, temperature 97.9 F (36.6 C), temperature source Oral, resp. rate 20, height 5' 8 (1.727 m), weight 93.4 kg, SpO2 100%.        Intake/Output Summary (Last 24 hours) at 12/12/2023 9281 Last data filed at 12/12/2023 0703 Gross per 24 hour  Intake 4707.64 ml  Output 1895 ml  Net 2812.64 ml   Filed Weights   12/04/23 1138 12/11/23 1845  Weight: 81.6 kg 93.4 kg    Examination: General: Critically ill appearing male, laying in bed, intubated and sedated, in NAD HENT: Atraumatic, normocephalic, neck supple, no JVD, orally intubated Lungs: Mechanical breath sounds throughout, even, non-labored, synchronous with vent Cardiovascular: RRR, s1s2, no M/R/G Abdomen: Obese, soft, non-tender, non-distended, no guarding or rebound tenderness, BS hypoactive Extremities: Right BKA with site well approximated, right groin with wound vac in place which is clean, dry, and intact Neuro: Sedated and paralyzed (just returned back from OR and received Rocuronium ), unable to follow commands, pupils sluggish, PERRL GU: Foley catheter in place  Resolved Hospital Problem list     Assessment & Plan:   #Shock: Hemorrhagic  #NSTEMI #Chronic combined Systolic & Diastolic CHF (LVEF 20-25%) PMHx: HTN, CAD, HLD -Echocardiogram 12/05/23: LVEF 20-25%, grade I DD, RV systolic function severely reduced, RV size normal, normal pulmonary artery systolic pressure -Continuous cardiac monitoring -Maintain MAP >65 -Gentle IV fluids -Transfusions as indicated -Vasopressors as needed to maintain MAP goal -Trend lactic acid until normalized -Trend HS Troponin until  peaked -Diuresis as BP and renal function permits ~ holding currently due to shock -Hold Coreg  for now -Resume ASA and Plavix  when cleared by Vascular Surgery -Was evaluated by Cardiology (signed off 8/16) ~ recommends continued medical treatment of CAD given lack of anginal symptoms   #Acute Blood Loss Anemia due to ... #Large right groin hematoma and hemorrhage following right external iliac artery to profunda femoris artery bypass -Monitor for S/Sx of bleeding -Trend CBC -SCD's for VTE Prophylaxis (resume anticoagulation/DVT ppx when cleared by Vascular Surgery) -Transfuse for Hgb <8 given cardiac history   #Right Femoral Artery Rupture s/p repair -Vascular Surgery following, appreciate input ~ site assessment and wound care as per Vascular -Pain control  #Post-op Ventilator Management PMHx: OSA -Full vent support, implement lung protective strategies -Plateau pressures less than 30 cm H20 -Wean FiO2 & PEEP as tolerated to maintain O2 sats >92% -Follow intermittent Chest X-ray & ABG as needed -Spontaneous Breathing Trials when respiratory parameters met and mental status permits -Implement VAP Bundle -Prn Bronchodilators  #Sepsis due to ... #Serratia Bacteremia #Questionable Pneumonia -Monitor fever curve -Trend WBC's & Procalcitonin -Follow cultures as above -ID following, appreciate input ~ Continue empiric Cefepime  pending cultures & sensitivities (may need 4-6 weeks of IV ABX per ID)  #Diabetes Mellitus -CBG's q4h; Target range of 140 to 180 -SSI -Follow ICU Hypo/Hyperglycemia protocol  #Sedation needs in setting of mechanical ventilation  PMHx: ETOH abuse  -Maintain a RASS goal of 0 to -1 -Fentanyl  and Propofol  to maintain RASS goal -Avoid sedating medications as able -Daily wake up assessment -Continue thiamine , folic acid , and MVI -Completed CIWA protocol, is outside the window for DT's as he has been admitted for 7 days now     Pt is critically ill with  multiorgan failure. Prognosis is guarded, high risk for further decompensation, cardiac arrest, and death.  Given current critical illness superimposed on multiple chronic co-morbidities and advanced age, overall long term  prognosis is poor.  Recommend consideration of DNR/DNI status.  Depending on clinical course, may need to consult Palliative Care to assist with GOC discussions.   Best Practice (right click and Reselect all SmartList Selections daily)   Diet/type: NPO DVT prophylaxis: SCD GI prophylaxis: PPI Lines: Arterial Line and yes and it is still needed Foley:  Yes, and it is still needed Code Status:  full code Last date of multidisciplinary goals of care discussion [8/22]  8/22: Will update pt's family when they arrive at bedside on plan of care.  Labs   CBC: Recent Labs  Lab 12/09/23 0523 12/10/23 0608 12/11/23 0507 12/11/23 1808 12/12/23 0313  WBC 5.6 7.5 7.0 10.5 12.5*  HGB 10.9* 11.0* 11.1* 10.5* 8.1*  HCT 31.5* 32.8* 33.6* 31.3* 24.2*  MCV 83.3 85.6 86.2 86.5 88.0  PLT 211 248 260 322 270    Basic Metabolic Panel: Recent Labs  Lab 12/06/23 0611 12/07/23 0538 12/08/23 0553 12/09/23 0523 12/10/23 0608 12/11/23 0507 12/12/23 0313  NA 129* 133* 134* 136 137 137 135  K 4.0 4.1 4.0 3.5 4.0 4.2 4.7  CL 99 101 101 104 106 108 106  CO2 21* 21* 25 24 22 22 22   GLUCOSE 149* 138* 82 86 128* 115* 122*  BUN 30* 29* 27* 22 18 16 20   CREATININE 1.25* 1.04 1.02 1.01 0.92 0.85 1.00  CALCIUM  7.9* 8.3* 8.6* 8.3* 8.4* 8.4* 8.0*  MG 1.9 2.0 2.6*  --  2.3  --   --   PHOS 2.8 2.7 2.3*  --  2.6  --   --    GFR: Estimated Creatinine Clearance: 65.3 mL/min (by C-G formula based on SCr of 1 mg/dL). Recent Labs  Lab 12/10/23 0608 12/11/23 0507 12/11/23 1808 12/12/23 0313  WBC 7.5 7.0 10.5 12.5*    Liver Function Tests: Recent Labs  Lab 12/06/23 0611 12/07/23 0538 12/08/23 0553  AST 37 33 25  ALT 19 26 22   ALKPHOS 60 73 76  BILITOT 1.0 0.9 0.7  PROT 5.5*  5.6* 6.2*  ALBUMIN 2.7* 2.7* 3.0*   No results for input(s): LIPASE, AMYLASE in the last 168 hours. No results for input(s): AMMONIA in the last 168 hours.  ABG    Component Value Date/Time   PHART 7.468 (H) 07/14/2023 1118   PCO2ART 41.1 07/14/2023 1118   PO2ART 65 (L) 07/14/2023 1118   HCO3 22.9 12/05/2023 0023   TCO2 34 (H) 07/14/2023 1125   ACIDBASEDEF 1.5 12/05/2023 0023   O2SAT 71.8 12/05/2023 0023     Coagulation Profile: No results for input(s): INR, PROTIME in the last 168 hours.  Cardiac Enzymes: No results for input(s): CKTOTAL, CKMB, CKMBINDEX, TROPONINI in the last 168 hours.  HbA1C: Hemoglobin A1C  Date/Time Value Ref Range Status  07/28/2023 11:48 AM 8.0 (A) 4.0 - 5.6 % Final   Hgb A1c MFr Bld  Date/Time Value Ref Range Status  07/10/2023 04:52 AM 8.0 (H) 4.8 - 5.6 % Final    Comment:    (NOTE) Pre diabetes:          5.7%-6.4%  Diabetes:              >6.4%  Glycemic control for   <7.0% adults with diabetes   11/10/2022 07:31 PM 6.9 (H) 4.8 - 5.6 % Final    Comment:    (NOTE)         Prediabetes: 5.7 - 6.4         Diabetes: >6.4  Glycemic control for adults with diabetes: <7.0     CBG: Recent Labs  Lab 12/11/23 1722 12/11/23 1859 12/11/23 1957 12/11/23 2341 12/12/23 0449  GLUCAP 124* 144* 151* 146* 114*    Review of Systems:   Unable to assess due to intubation/sedation/critical illness   Past Medical History:  He,  has a past medical history of Acute ST elevation myocardial infarction (STEMI) of inferior wall (HCC) (04/19/2010), Allergies, Arthritis, Benign essential hypertension, Bilateral carotid artery disease (HCC) (05/08/2021), CAD (coronary artery disease) (04/19/2010), Cellulitis of foot, Chronic HFrEF (heart failure with reduced ejection fraction) (HCC), DDD (degenerative disc disease), cervical, Diabetes mellitus type 2, insulin  dependent (HCC), Diverticulosis, Full dentures, Gout, Hard of hearing,  History of bilateral cataract extraction (2022), History of ETOH abuse, Hyperlipidemia, Ischemic cardiomyopathy (04/19/2010), Long term current use of aspirin , Long term current use of clopidogrel , Lumbar degenerative disc disease, Lumbar radiculopathy, Lumbar vertebral fracture (chronic superior endplate of L1), NSTEMI (non-ST elevated myocardial infarction) (HCC) (01/15/2023), OSA (obstructive sleep apnea), Peripheral artery disease (HCC), Peripheral neuropathy, and Umbilical hernia.   Surgical History:   Past Surgical History:  Procedure Laterality Date   AMPUTATION Right 11/18/2022   Procedure: AMPUTATION 4TH AND 5TH RAY;  Surgeon: [name], [name];  Location: [facility];  Service: Orthopedics/Podiatry;  Laterality: Right;  4th and 5th toe   AMPUTATION Right 04/20/2023   Procedure: AMPUTATION BELOW KNEE;  Surgeon: [name], MD;  Location: [facility];  Service: Vascular;  Laterality: Right;  block then general   APPLICATION OF WOUND VAC Right 01/09/2023   Procedure: APPLICATION OF WOUND VAC;  Surgeon: [name], MD;  Location: [facility];  Service: Vascular;  Laterality: Right;   CATARACT EXTRACTION W/PHACO Right 03/14/2021   Procedure: CATARACT EXTRACTION PHACO AND INTRAOCULAR LENS PLACEMENT (IOC) RIGHT DIABETIC;  Surgeon: [name], MD;  Location: [facility];  Service: Ophthalmology;  Laterality: Right;  Diabetic 16.78 01:39.9   CATARACT EXTRACTION W/PHACO Left 03/28/2021   Procedure: CATARACT EXTRACTION PHACO AND INTRAOCULAR LENS PLACEMENT (IOC) LEFT DIABETIC 6.93 01:22.0;  Surgeon: [name], MD;  Location: [facility];  Service: Ophthalmology;  Laterality: Left;  Diabetic   COLONOSCOPY     CORONARY ANGIOPLASTY WITH STENT PLACEMENT  03/2010   Procedure: CORONARY ANGIOPLASTY WITH STENT PLACEMENT; Location: [name]   CORONARY STENT INTERVENTION N/A 07/14/2023   Procedure: CORONARY STENT INTERVENTION;  Surgeon: [name], MD;  Location: [facility];  Service: Cardiovascular;  Laterality: N/A;   ENDARTERECTOMY FEMORAL Bilateral 11/15/2022   Procedure: BILATERAL COMMON FEMORAL PROFUNDA FEMORIS AND SUPERFICIAL FEMORAL ARTERY ENDARTECTOMIES, RIGHT FOGARTY EMBOLECTOMY OF THE RIGHT SFA  AND  POPLITEAL ARTERIES. AORTAGRAM AND RIGHT LOWER EXTREMITY ANGIOGRAM.;  Surgeon: [name], MD;  Location: [facility];  Service: Vascular;  Laterality: Bilateral;   INSERTION OF ILIAC STENT Bilateral 11/15/2022   Procedure: BILATERAL STENT INSERTION IN BILATERAL  COMMON ILIAC ARTERY, STENT INSERTION OF LEFT EXTERNAL ILIAC ARTERY. ANGIOPLASTY RIGHT TIBIAL  AND POPLITEAL ARTERY.;  Surgeon: [name], MD;  Location: [facility];  Service: Vascular;  Laterality: Bilateral;   IRRIGATION AND DEBRIDEMENT KNEE Right 04/21/2023   Procedure: IRRIGATION AND DEBRIDEMENT KNEE;  Surgeon: Zafonte, Brian Sigmund, MD;  Location: [facility];  Service: Orthopedics;  Laterality: Right;   LOWER EXTREMITY ANGIOGRAPHY Right 05/14/2021   Procedure: LOWER EXTREMITY ANGIOGRAPHY;  Surgeon: [name], MD;  Location: [facility];  Service: Cardiovascular;  Laterality: Right;   LOWER EXTREMITY ANGIOGRAPHY Right 06/14/2021   Procedure:  Lower Extremity Angiography;  Surgeon: [name], MD;  Location: [facility];  Service: Cardiovascular;  Laterality: Right;   LOWER EXTREMITY ANGIOGRAPHY Right 11/11/2022   Procedure: Lower Extremity Angiography;  Surgeon: [name], MD;  Location: [facility];  Service: Cardiovascular;  Laterality: Right;   LOWER EXTREMITY ANGIOGRAPHY Right 12/04/2023   Procedure: Lower Extremity Angiography;  Surgeon: [name], MD;  Location: [facility];  Service: Cardiovascular;  Laterality: Right;   LOWER EXTREMITY INTERVENTION Right 06/15/2021   Procedure: LOWER EXTREMITY INTERVENTION;  Surgeon: [name], MD;  Location: [facility];  Service: Cardiovascular;  Laterality: Right;   RIGHT/LEFT HEART  CATH AND CORONARY ANGIOGRAPHY N/A 07/14/2023   Procedure: RIGHT/LEFT HEART CATH AND CORONARY ANGIOGRAPHY;  Surgeon: [name], MD;  Location: [facility];  Service: Cardiovascular;  Laterality: N/A;   TONSILLECTOMY     TRANSMETATARSAL AMPUTATION Right 02/17/2023   Procedure: TRANSMETATARSAL AMPUTATION;  Surgeon: Lennie Barter, [name];  Location: [facility];  Service: Orthopedics/Podiatry;  Laterality: Right;   WOUND DEBRIDEMENT Right 01/09/2023   Procedure: DEBRIDEMENT WOUND;  Surgeon: [name], MD;  Location: [facility];  Service: Vascular;  Laterality: Right;     Social History:   reports that he has never smoked. He has never been exposed to tobacco smoke. He has never used smokeless tobacco. He reports current alcohol  use of about 21.0 standard drinks of alcohol  per week. He reports that he does not use drugs.   Family History:  His family history includes Heart disease in his father; Scoliosis in his mother.   Allergies No Known Allergies   Home Medications  Prior to Admission medications   Medication Sig Start Date End Date Taking? Authorizing Provider  aspirin  EC 81 MG tablet Take 1 tablet (81 mg total) by mouth daily. 11/26/22  Yes Maree Hue, MD  atorvastatin  (LIPITOR ) 80 MG tablet Take 1 tablet (80 mg total) by mouth at bedtime. 11/18/23  Yes Gollan, Timothy J, MD  carvedilol  (COREG ) 25 MG tablet Take 1 tablet (25 mg total) by mouth 2 (two) times daily with a meal. 11/18/23  Yes Gollan, Timothy J, MD  clopidogrel  (PLAVIX ) 75 MG tablet Take 1 tablet (75 mg total) by mouth daily. 11/18/23  Yes Gollan, Timothy J, MD  dapagliflozin  propanediol (FARXIGA ) 10 MG TABS tablet Take 1 tablet (10 mg total) by mouth daily. 11/18/23  Yes Gollan, Timothy J, MD  escitalopram  (LEXAPRO ) 5 MG tablet Take 5 mg by mouth daily.   Yes [provider]  ferrous sulfate 325 (65 FE) MG EC tablet Take 325 mg by mouth daily with breakfast.   Yes [provider]  folic acid  (FOLVITE )  1 MG tablet Take 1 tablet (1 mg total) by mouth daily. 09/12/23  Yes [name], [name], MD  furosemide  (LASIX ) 40 MG tablet Take 1 tablet (40 mg total) by mouth daily. 11/18/23  Yes Gollan, Timothy J, MD  gabapentin  (NEURONTIN ) 100 MG capsule 1 po at bedtime. 10/30/23  Yes [name], [name], MD  insulin  aspart protamine - aspart (NOVOLOG  MIX 70/30) (70-30) 100 UNIT/ML injection Inject 30 Units into the skin 2 (two) times daily with a meal.   Yes [provider]  methocarbamol  (ROBAXIN ) 500 MG tablet Take 500 mg by mouth every 8 (eight) hours as needed for muscle spasms.   Yes [provider]  nitroGLYCERIN  (NITROSTAT ) 0.4 MG SL tablet Place 1 tablet (0.4 mg total) under the tongue every  5 (five) minutes as needed for chest pain. Max: 3 tablets per day 07/16/23 07/15/24 Yes Dorinda Drue DASEN, MD  ondansetron  (ZOFRAN ) 4 MG tablet Take 1 tablet (4 mg total) by mouth every 6 (six) hours as needed for nausea. 05/08/23  Yes Laurence Locus, DO  polyethylene glycol (MIRALAX  / GLYCOLAX ) 17 g packet Take 17 g by mouth daily as needed for mild constipation. 02/19/23  Yes Caleen Qualia, MD  sacubitril -valsartan  (ENTRESTO ) 24-26 MG Take 1 tablet by mouth every 12 (twelve) hours. 11/18/23 11/17/24 Yes Gollan, Timothy J, MD  senna-docusate (SENOKOT-S) 8.6-50 MG tablet Take 1 tablet by mouth at bedtime as needed for mild constipation. 06/17/21  Yes Jillian Buttery, MD  spironolactone  (ALDACTONE ) 25 MG tablet Take 1 tablet (25 mg total) by mouth daily. 11/18/23  Yes Gollan, Timothy J, MD  thiamine  (VITAMIN B-1) 100 MG tablet Take 1 tablet (100 mg total) by mouth daily. 02/20/23  Yes Caleen Qualia, MD  Ensure Max Protein (ENSURE MAX PROTEIN) LIQD Take 330 mLs (11 oz total) by mouth 2 (two) times daily. Patient taking differently: Take 11 oz by mouth daily. 11/26/22   Maree Hue, MD  insulin  glargine (LANTUS  SOLOSTAR) 100 UNIT/ML Solostar Pen Inject 10 Units into the skin daily. Patient not taking: Reported on 11/18/2023  07/28/23   [name], [name], MD  metolazone  (ZAROXOLYN ) 2.5 MG tablet Take 1 tablet (2.5 mg total) by mouth daily for 3 days. Take 30 minutes before your lasix . Patient not taking: Reported on 11/18/2023 07/21/23 11/18/23  Donette Ellouise LABOR, FNP  traZODone  (DESYREL ) 150 MG tablet Take 75 mg by mouth at bedtime. Patient not taking: Reported on 11/18/2023    [provider]     Critical care time: 55 minutes     Inge Lecher, AGACNP-BC [facility] Prefer epic messenger for cross cover needs If after hours, please call E-link

## 2023-12-12 NOTE — Progress Notes (Signed)
 Pt has been in PSV 5/5 for approximately 1 hour.  Pt is awake and alert, following commands, nodding to questions.  Rapid shallow breathing index score of 32 (RR 20, RV 630 ml) indicative of successful weaning.  No significant secretion burden.  Discussed with Dr. Isadora, will extubate.  Given his CHF history and currently hypertensive with SBP 160-180, will give 20 mg IV Lasix  x1 and extubate to HHFNC go give som positive pressure.  RN and RT aware of plan.   Pt's significant other updated at bedside.    Additional Critical Care time: 15 minutes  Inge Lecher, AGACNP-BC [facility] Prefer epic messenger for cross cover needs If after hours, please call E-link

## 2023-12-12 NOTE — Progress Notes (Signed)
 OT Cancellation Note  Patient Details Name: WALLIE LAGRAND MRN: [id number] DOB: November 26, 1945   Cancelled Treatment:    Reason Eval/Treat Not Completed: Other (comment);Medical issues which prohibited therapy. Per chart, pt with surgical procedure yesterday and has transferred to ICU and still intubated. Pt will need new orders when appropriate for therapy. OT to complete orders at this time.   Izetta Claude, MS, OTR/L , CBIS ascom [phone]  12/12/23, 10:12 AM

## 2023-12-12 NOTE — Transfer of Care (Signed)
 Immediate Anesthesia Transfer of Care Note  Patient: Manuel [name]  Procedure(s) Performed: EVACUATION HEMATOMA EXPLORATION, ARTERY, FEMORAL- stop bleeding with suture repair anastomaosis , abx beads (Right: Groin) APPLICATION, WOUND VAC (Right: Groin)  Patient Location: ICU  Anesthesia Type:General  Level of Consciousness: sedated  Airway & Oxygen Therapy: Patient remains intubated per anesthesia plan  Post-op Assessment: Report given to RN and Post -op Vital signs reviewed and stable  Post vital signs: Reviewed and stable  Last Vitals:  Vitals Value Taken Time  BP    Temp    Pulse    Resp    SpO2      Last Pain:  Vitals:   12/12/23 0528  TempSrc: Oral  PainSc:       Patients Stated Pain Goal: 2 (12/06/23 1230)  Complications: No notable events documented.

## 2023-12-12 NOTE — Interval H&P Note (Signed)
 History and Physical Interval Note:  12/12/2023 5:19 AM  Manuel [name]  has presented today for surgery, with the diagnosis of Right common femoral artery hematoma.  The various methods of treatment have been discussed with the patient and family. After consideration of risks, benefits and other options for treatment, the patient has consented to  Procedure(s) with comments: ENDARTERECTOMY, FEMORAL (Right) - Re-do of femoral endarterectomy with sartorius flap APPLICATION, WOUND VAC (Right) as a surgical intervention.  The patient's history has been reviewed, patient examined, no change in status, stable for surgery.  I have reviewed the patient's chart and labs.  Questions were answered to the patient's satisfaction.     Caylen Kuwahara

## 2023-12-12 NOTE — Progress Notes (Signed)
 Extubation Procedure Note  Patient Details:   Name: SAYLOR SHECKLER DOB: 1946-01-12 MRN: [id number]   Airway Documentation:    Vent end date: (P) 12/12/23 Vent end time: (P) 1220   Evaluation  O2 sats: stable throughout Complications: No apparent complications Patient did tolerate procedure well.    HHFNC 40L@ 36% (Tolerating well with sats of 98%)  Clotilda SAILOR Kripa Foskey 12/12/2023, 12:34 PM

## 2023-12-12 NOTE — Consult Note (Signed)
 PHARMACY - ANTICOAGULATION CONSULT NOTE  Pharmacy Consult for IV Heparin  Indication: chest pain/ACS  Patient Measurements: Height: 5' 8 (172.7 cm) Weight: 93.4 kg (205 lb 14.6 oz) IBW/kg (Calculated) : 68.4 HEPARIN  DW (KG): 87.9  Labs: Recent Labs    12/10/23 0608 12/11/23 0507 12/11/23 1808 12/12/23 0313 12/12/23 0453  HGB 11.0* 11.1* 10.5* 8.1*  --   HCT 32.8* 33.6* 31.3* 24.2*  --   PLT 248 260 322 270  --   HEPARINUNFRC 0.52 0.56  --   --  0.34  CREATININE 0.92 0.85  --  1.00  --     Estimated Creatinine Clearance: 65.3 mL/min (by C-G formula based on SCr of 1 mg/dL).   Medical History: Past Medical History:  Diagnosis Date   Acute ST elevation myocardial infarction (STEMI) of inferior wall (HCC) 04/19/2010   a.) transfered from [facility] --> LHC/PCI (very difficult procedure) --> 3.0 x 23 mm and 3.0 x 12 mm Xience stents to RCA   Allergies    Arthritis    Benign essential hypertension    Bilateral carotid artery disease (HCC) 05/08/2021   a.) carotid doppler 05/08/2021: 1-39% BICA   CAD (coronary artery disease) 04/19/2010   a.) inferior STEMI 04/19/2010 --> LHC/PCI: 50-70% pD1, 80% pRI, 90/90/90% RCA (overlapping 3.0 x 23 and 3.0 x 12 mm Xience DES); b.) MV 11/10/2018: fixed minimally reversible inferior perfusion defect   Cellulitis of foot    Chronic HFrEF (heart failure with reduced ejection fraction) (HCC)    DDD (degenerative disc disease), cervical    Diabetes mellitus type 2, insulin  dependent (HCC)    Diverticulosis    Full dentures    Gout    Hard of hearing    History of bilateral cataract extraction 2022   History of ETOH abuse    Hyperlipidemia    Ischemic cardiomyopathy 04/19/2010   a.) TTE 04/19/2010: 40%; b.) TTE 04/20/2014: EF >55%; c.) TTE 11/10/2018: EF 45%; d.) TTE 11/14/2022: EF 25-30%; e. 06/2023 Echo: EF 25-30%, nl RV size/fxn. Mild MR. Mild-mod TR. Ao sclerosis w/o stenosis.   Long term current use of aspirin     Long term current  use of clopidogrel     Lumbar degenerative disc disease    Lumbar radiculopathy    Lumbar vertebral fracture (chronic superior endplate of L1)    NSTEMI (non-ST elevated myocardial infarction) (HCC) 01/15/2023   OSA (obstructive sleep apnea)    a.) unable to tolerate nocturnal PAP therapy   Peripheral artery disease (HCC)    a.) stenting 05/14/21: 12 mm x 12 cm LifeStent RIGHT dis SFA/prox pop; b.) s/p cath directed thrombolysis RIGHT SFA/pop 06/14/21; c.) s/p mech thrombectomy + stenting 06/15/21: 8 mm x 25cm & 8 mm x 7.5cm Viabahn; d.) s/p BILAT CFA, profunda femoris, SFA endarterectomies + fogarty embolectomy + stenting 11/15/22: 12mm x 58mm Lifestream BILAT CIAs, 14 mm x 6 cm Lifestream & 13 mm x 5 cm Viabahn LEFT EIA   Peripheral neuropathy    Umbilical hernia     Medications:  No anticoagulation prior to admission per my chart review. Takes ASA / Plavix  at home  Assessment: 78 y/o M with medical history as above and including CAD, PAD presenting to the ED 8/14 with right common femoral artery hematoma with active extravasation. Patient underwent endovascular intervention same day. However, clinical picture complicated by rising troponin (715 >> 6596). Pharmacy consulted to dose heparin . IV heparin  was stopped this morning for bleeding in groin; vascular surgery now wants to  resume  Baseline Labs aPTT 55s and INR 1.3  Baseline H&H, platelets are within normal limits.  8/17:  HL @ 0538 = 0.27, SUBtherapeutic  8/17 1233 HL 0.52,  therapeutic x1 8/17 1951 HL 0.42, therapeutic x2 8/18 0553 HL 0.50, therapeutic X 3 8/19 0523 HL 0.51, therapeutic x 4 8/20 0608 HL 0.52, therapeutic x 5 8/21 0507 HL 0.56, therapeutic x 6 8/22 0453 HL 0.34, therapeutic   Goal of Therapy:  Heparin  level 0.3-0.7 units/ml Monitor platelets by anticoagulation protocol: Yes   Plan:  Heparin  infusion last running at 2050 units/hr. Pt now in OR for Vascular procedure Pharmacy to F/U regarding anticoag plan  post-op  Rankin CANDIE Dills, PharmD, [facility] 12/12/2023 5:41 AM

## 2023-12-12 NOTE — H&P (View-Only) (Signed)
 Patient was doing well until approximately 1 hour ago when he began developing acute rapid bleeding into his VAC canister.  This occurred after moving for positioning in the ICU.  He has filled up to [facility] canisters which is equivalent to nearly 1 L of blood.  The ICU staff has done an excellent job and has held firm pressure for over a half an hour now.  The VAC was taken off of suction immediately on development of bleeding.  It is likely that he has had arterial bleeding from the site of his tenuous bypass anastomosis.  This is clearly a critical and both life and limb threatening situation.  Patient is awake and wife is at bedside.  We explained the grave nature of the situation to them this morning.  He will need to be taken back emergently to the operating room to try to repair bleeding.  There is a possibility we will have to ligate the bypass in which case he will have profound ischemia to the residual right lower extremity.  He is already status post right below-knee amputation.  There is a significant risk of exsanguination.  I see no other alternative however with this much large-volume bleeding less than 12 hours after an incredibly difficult and complicated surgical repair for patch blowout and infection.  This is clearly an acute life-threatening situation.  Immediate surgical exploration is warranted.

## 2023-12-12 NOTE — Plan of Care (Signed)
  Problem: Education: Goal: Knowledge of General Education information will improve Description: Including pain rating scale, medication(s)/side effects and non-pharmacologic comfort measures Outcome: Progressing   Problem: Health Behavior/Discharge Planning: Goal: Ability to manage health-related needs will improve Outcome: Progressing   Problem: Clinical Measurements: Goal: Ability to maintain clinical measurements within normal limits will improve Outcome: Progressing Goal: Will remain free from infection Outcome: Progressing Goal: Diagnostic test results will improve Outcome: Progressing Goal: Respiratory complications will improve Outcome: Progressing Goal: Cardiovascular complication will be avoided Outcome: Progressing   Problem: Activity: Goal: Risk for activity intolerance will decrease Outcome: Progressing   Problem: Coping: Goal: Level of anxiety will decrease Outcome: Progressing   Problem: Elimination: Goal: Will not experience complications related to bowel motility Outcome: Progressing Goal: Will not experience complications related to urinary retention Outcome: Progressing   Problem: Pain Managment: Goal: General experience of comfort will improve and/or be controlled Outcome: Progressing   Problem: Safety: Goal: Ability to remain free from injury will improve Outcome: Progressing   Problem: Nutrition: Goal: Adequate nutrition will be maintained Outcome: Not Progressing

## 2023-12-12 NOTE — Progress Notes (Addendum)
 9645- spoke with Orvin Daring, NP concerning patients hgb down to 8.1, wound vac canister has been changed once this shift and contained 500 ml of bloody drainage and 200 ml bloody drainage currently in new canister. No new orders currently; will call Dr. [name] and then call me back.   [id number] Orvin Daring, NP called back to say leave wound vac dressing in place, stop heparin , reduce wound vac from 75 mm/hg to 25 mm/hg, and notify vascular if wound vac output increases any more this shift or if you think site is actively bleeding.   [id number] Orvin Daring, NP paged due to bleeding under/around wound vac dressing.  [id number] Orvin Daring, NP returned call from page, apply pressure directly over wound dressing and she will notify Dr. [name].   Lesley, Charge RN  began holding pressure at 0421 directly over wound vac dressing until Dr. [name] arrived at approximately 0500 and determined that he would be taking the patient back to OR.    4- Orvin Daring, NP called to let this RN know that Dr. [name] is on his way to see patient now.   0500- Dr. [name] in to see patient at bedside and will be taking patient back to OR.  [phone]- 1 unit pRBC's started per order.  9471 -Vital signs obtained 15 min. After blood began stable.  0530- Patient being transported via bed to OR.

## 2023-12-12 NOTE — Op Note (Signed)
 OPERATIVE NOTE   PROCEDURE: 1.   Right groin reexploration for bleeding 2.   Evacuation of right groin hematoma 3.   Suture repair of proximal bypass anastomosis in the distal external iliac artery 4.   Replacement of the sartorius flap for coverage 5.   Placement of gentamicin  and vancomycin  antibiotic impregnated beads 6.   Negative pressure dressing placement right groin    PRE-OPERATIVE DIAGNOSIS: 1.right groin hematoma with hemorrhage after right external iliac artery to profunda femoris artery bypass the day before  POST-OPERATIVE DIAGNOSIS: Same  SURGEON: Selinda Gu, MD  ANESTHESIA:  general  ESTIMATED BLOOD LOSS: 50 cc of acute blood although a significant amount of hematoma was evacuated  FINDING(S): 1.  Small amount of bleeding from the proximal portion of the external iliac artery to profunda femoris artery bypass, with suture repair obtaining hemostasis  SPECIMEN(S): None  INDICATIONS:    Patient presents acute large-volume bleeding from his right groin wound after extensive vascular surgery yesterday including right external iliac artery to profunda femoris artery bypass with autologous ipsilateral superficial femoral artery.  This began after positioning the patient in the ICU and he had nearly 1 L of blood from his negative pressure VAC canister.  He and his wife were present and he needed emergent return to the operating room and this was explained to them.  The risks and benefits as well as alternative therapies including intervention were reviewed in detail all questions were answered the patient agrees to proceed with surgery.  DESCRIPTION: After obtaining full informed written consent, the patient was brought back to the operating room and placed supine upon the operating table.  The patient received IV antibiotics prior to induction.  After obtaining adequate anesthesia, the patient was prepped and draped in the standard fashion appropriate time out is called.  The  existing VAC sponge was removed and blood was slowly welling up in the wound. I then took down the sartorius flap by removing the Vicryl sutures that had been placed to the day prior and reflected the sartorius flap laterally.  There was a large amount of clotted blood and hematoma that was then evacuated.  On evacuation of the hematoma, several minutes of pressure were held and then the wound was evaluated.  There was a site of bleeding on the anterior medial portion of the proximal bypass anastomosis between the right external iliac artery and the SFA conduit.  The distal bypass anastomosis appeared to be hemostatic.  I then used 5-0 Prolene suture and a double pledget in the area of bleeding.  An additional 5-0 Prolene suture was used as well.  This resulted in hemostasis and no further bleeding from the external iliac artery to profunda femoris artery bypass. Several minutes of pressure were then held.  The patient remained hemodynamically stable throughout this process with anesthesia doing an excellent job of volume and blood resuscitation.  At this point, any areas of bleeding from the subcutaneous tissue or muscle were controlled with electrocautery.  No ongoing active bleeding was seen.  The wound was copiously irrigated with approximately 1 L of Vashe irrigation.  Surgicel, fibrillar and then Vistaseal  topical hemostatic agents were placed in the femoral incision and hemostasis was complete.  Gentamicin  and vancomycin  impregnated antibiotic beads were created.  We used a medium size and used 1 g of vancomycin  and 80 mg of gentamicin .  These were then placed deep to the sartorius flap.  The sartorius flap was then pulled back over medially to cover  the artery and the bypass.  The sartorius flap was then resecured with a series of interrupted 2-0 Vicryl sutures to the medial wall and the superior wall.  This resulted in coverage of the external iliac artery to profunda femoris artery bypass and no  exposed vascular structures at this point.  More of the antibiotic beads were then placed on top of the flap.  The dead space particularly laterally and superiorly and the incision was then closed with interrupted 2-0 Vicryl sutures over the sartorius flap.  A small amount of coverage was also able to be obtained at the distal portion of the wound with interrupted 2-0 Vicryl sutures.  A medium VAC sponge was then cut to fit the wound which was about 22 cm in length and about 12 cm in width.  Strips of Ioban were used and a good occlusive seal was obtained when connected to suction.  We placed this at 25 mmHg suction on the machine for the negative pressure dressing.  The patient was then taken to the ICU in stable condition having tolerated the procedure well remaining intubated.  COMPLICATIONS: None  CONDITION: Stable     Selinda Gu 12/12/2023 6:48 AM   This note was created with Dragon Medical transcription system. Any errors in dictation are purely unintentional.

## 2023-12-12 NOTE — Progress Notes (Signed)
 ETT tube advanced 2 cm per order.  Currently positioned at 26cm @ lip.

## 2023-12-12 NOTE — Progress Notes (Signed)
 PT Cancellation Note  Patient Details Name: Manuel [name] MRN: [id number] DOB: [birth year]   Cancelled Treatment:    Reason Eval/Treat Not Completed: Medical issues which prohibited therapy Patient currently intubated following procedure on previous date. Will complete orders at this time. Please re-consult when medically appropriate to initiate early ICU mobility.   Maryanne Finder, PT, DPT Physical Therapist - [place]  [facility]    Tahir Blank A Marcille Barman 12/12/2023, 10:10 AM

## 2023-12-12 NOTE — Anesthesia Procedure Notes (Signed)
 Procedure Name: Intubation Date/Time: 12/12/2023 5:42 AM  Performed by: Tod Handing, CRNAPre-anesthesia Checklist: Patient identified, Patient being monitored, Timeout performed, Emergency Drugs available and Suction available Patient Re-evaluated:Patient Re-evaluated prior to induction Oxygen Delivery Method: Circle system utilized Preoxygenation: Pre-oxygenation with 100% oxygen Induction Type: IV induction Ventilation: Mask ventilation without difficulty Laryngoscope Size: Mac, 4 and McGrath Grade View: Grade II Tube type: Oral Tube size: 7.0 mm Number of attempts: 1 Airway Equipment and Method: Stylet Placement Confirmation: ETT inserted through vocal cords under direct vision, positive ETCO2 and breath sounds checked- equal and bilateral Secured at: 23 cm Tube secured with: Tape Dental Injury: Teeth and Oropharynx as per pre-operative assessment

## 2023-12-13 DIAGNOSIS — I772 Rupture of artery: Secondary | ICD-10-CM | POA: Diagnosis not present

## 2023-12-13 LAB — GLUCOSE, CAPILLARY
Glucose-Capillary: 151 mg/dL — ABNORMAL HIGH (ref 70–99)
Glucose-Capillary: 89 mg/dL (ref 70–99)
Glucose-Capillary: 134 mg/dL — ABNORMAL HIGH (ref 70–99)
Glucose-Capillary: 127 mg/dL — ABNORMAL HIGH (ref 70–99)
Glucose-Capillary: 181 mg/dL — ABNORMAL HIGH (ref 70–99)

## 2023-12-13 LAB — BASIC METABOLIC PANEL WITH GFR
Anion gap: 8 (ref 5–15)
BUN: 25 mg/dL — ABNORMAL HIGH (ref 8–23)
CO2: 19 mmol/L — ABNORMAL LOW (ref 22–32)
Calcium: 8.1 mg/dL — ABNORMAL LOW (ref 8.9–10.3)
Chloride: 105 mmol/L (ref 98–111)
Creatinine, Ser: 1.23 mg/dL (ref 0.61–1.24)
GFR, Estimated: 59 mL/min — ABNORMAL LOW (ref >60)
Glucose, Bld: 140 mg/dL — ABNORMAL HIGH (ref 70–99)
Potassium: 4.8 mmol/L (ref 3.5–5.1)
Sodium: 132 mmol/L — ABNORMAL LOW (ref 135–145)

## 2023-12-13 LAB — CBC
HCT: 32.8 % — ABNORMAL LOW (ref 39.0–52.0)
Hemoglobin: 10.6 g/dL — ABNORMAL LOW (ref 13.0–17.0)
MCH: 29.6 pg (ref 26.0–34.0)
MCHC: 32.3 g/dL (ref 30.0–36.0)
MCV: 91.6 fL (ref 80.0–100.0)
Platelets: 266 K/uL (ref 150–400)
RBC: 3.58 MIL/uL — ABNORMAL LOW (ref 4.22–5.81)
RDW: 17.1 % — ABNORMAL HIGH (ref 11.5–15.5)
WBC: 13.3 K/uL — ABNORMAL HIGH (ref 4.0–10.5)
nRBC: 0 % (ref 0.0–0.2)

## 2023-12-13 LAB — CULTURE, BLOOD (ROUTINE X 2)
Culture: NO GROWTH
Culture: NO GROWTH
Special Requests: ADEQUATE

## 2023-12-13 LAB — HEMOGLOBIN AND HEMATOCRIT, BLOOD
HCT: 31.5 % — ABNORMAL LOW (ref 39.0–52.0)
Hemoglobin: 10.4 g/dL — ABNORMAL LOW (ref 13.0–17.0)

## 2023-12-13 MED ORDER — SODIUM CHLORIDE 0.9 % IV SOLN
2.0000 g | Freq: Two times a day (BID) | INTRAVENOUS | Status: DC
  Administered 2023-12-13 – 2023-12-15 (×4): 2 g via INTRAVENOUS
  Filled 2023-12-13 (×5): qty 12.5

## 2023-12-13 NOTE — Progress Notes (Signed)
 Patient made himself a DNR/DNI this morning.  After that discussion with Dr. Fausto- patient is emotional- stating I believe I am actively dying. Patient refuses to believe nothing but that. I asked if he would like to speak to the Chaplain- but he declined stating that he was not religious and does not believe in God. Patient stated since he feels like this is the end for him- he stated he did not want to take any medications including his antibiotics because they will not help him live.  I relayed this information to Dr. Fausto.

## 2023-12-13 NOTE — IPAL (Incomplete)
  Interdisciplinary Goals of Care Family Meeting   Date carried out: 12/13/2023  Location of the meeting: {IDGC Location:21390}  Member's involved: {IDGC Participants:21367::Physician,Bedside Registered Nurse,Social Worker,Family Member or next of kin}  Durable Power of Insurance risk surveyor: ***    Discussion: We discussed goals of care for [employer] .  ***  Code status:   Code Status: Limited: Do not attempt resuscitation (DNR) -DNR-LIMITED -Do Not Intubate/DNI    Disposition: {IDGC Disposition:21372}  Time spent for the meeting: ***    Burnard DELENA Cunning, DO  12/13/2023, 10:53 AM

## 2023-12-13 NOTE — TOC CM/SW Note (Signed)
..  Transition of Care [facility]) - Inpatient Brief Assessment   Patient Details  Name: Manuel [name] MRN: [id number] Date of Birth: May 24, 1945  Transition of [facility]) CM/SW Contact:    Edsel DELENA Fischer, LCSW Phone Number: 12/13/2023, 11:14 AM   Clinical Narrative:  SW to handoff to heart failure team  Transition of Care Asessment:

## 2023-12-13 NOTE — Op Note (Signed)
 [facility]    OPERATIVE NOTE   PROCEDURE: Right external iliac to profunda femoris artery bypass with autologous superficial femoral artery conduit. Resection of infected common femoral artery, infected common femoral artery stents as well as infected profunda femoris artery stents. Endarterectomy of the segment of autologous right superficial femoral artery as preparation for bypass conduit. Sartorius muscle flap coverage of the arterial reconstruction right groin Placement of a wound VAC right groin. Redo vascular bypass surgery  PRE-OPERATIVE DIAGNOSIS: Infected right common femoral artery bovine patch in association with previously placed Viabahn stents for treatment of life-threatening hemorrhage.  POST-OPERATIVE DIAGNOSIS: Same.  CO-SURGEONS: Cordella KANDICE Shawl, M.D. and Selinda GORMAN Gu, MD  ASSISTANT(S): Toribio Alas, RNFA  ANESTHESIA: general  ESTIMATED BLOOD LOSS: 1000 cc  FINDING(S): None   SPECIMEN(S): Debrided tissue including a portion of the patch as well as hematoma material to pathology for permanent section.  Also, debrided tissue hematoma material and a portion of the patch sent to microbiology  INDICATIONS:   Manuel [name] is a 78 y.o. male who presents with an infected bovine pericardial patch with rupture.  He underwent emergent placement of Viabahn stents to prevent exsanguinating rupture.  He is now undergoing definitive reconstruction and debridement of the infected tissue.  The risk, benefits, and alternative for bypass operations were discussed with the patient.  The patient is aware the risks include but are not limited to: bleeding, infection, myocardial infarction, stroke, limb loss, nerve damage, need for additional procedures in the future, wound complications, and inability to complete the bypass.  The patient is voices understanding of these risks and agreed to proceed.  DESCRIPTION: After informed consent was obtained, the  patient was brought back to the operating room and placed in the supine position.  Prior to induction, the patient was given intravenous antibiotics.  After general anesthesia is induced, the patient was prepped and draped in the standard fashion for a femoral to popliteal bypass operation.  Appropriate timeout is called.   Co-surgeons are required because this is a complex multilevel procedure with work being performed simultaneously from both the patient's right and left sides.  This also expedites the procedure making a shorter operative time reducing complications and improving patient safety.  Attention was turned to the right groin.  A longitudinal incision was made over the right common femoral artery but higher on the abdominal wall so that the ilioinguinal ligament was exposed.  The dissection was then carried more proximally elevating the ilioinguinal ligament.  The circumflex vessels were identified and looped with Vesseloops.  The external iliac was identified and then dissected circumferentially and a vessel loop was passed around it.  The incision was then extended distally opening the hematoma cavity and hematoma contents were removed.  Some was sent for tissue culture and some was sent for permanent section.  The common femoral artery was identified with the patch which was disrupted on the lateral wall.  The dissection was then carried down to identify the SFA.  The incision was extended distal to the original incision exposing SFA that had not previously been dissected.  The SFA was then reflected medially and the dissection deepened to expose the profunda femoris artery.  Once this was identified it was looped with Silastic Vesseloops.  The dissection was then carried from the external iliac to the profunda femoris dissecting the common femoral and debriding tissue.  The patch was also debrided in its entirety.  This was then added to the 2  previous specimens both for microbiology as well as  permanent section.  Given the circumstances this dissection was extremely difficult both given the nature of the redo surgery as well as the existing infection causing inflammation in association with the hematoma formation.  6000 units of heparin  was given and allowed to circulate.  We then dissected the SFA distally for a distance of approximately 10 cm.  The SFA was then ligated and divided both proximally and distally.  This was performed so that we would have autologous tissue in the face of infection to reconstruct his femoral artery.  Eversion endarterectomy of the SFA segment was then performed previously placed Viabahn stent was removed as well as the atherosclerotic plaque.  The conduit was then reconverted and irrigated with heparinized saline.  The external iliac and profunda femoris arteries were then clamped with vascular clamps and the Viabahn stents that were placed for treatment of the rupture were removed.  They actually came out quite easily.  The common femoral artery was then debrided further up to the external iliac artery.  And then down to the profunda femoris.  A segment of the SFA was then used in and SFA conduit to and external iliac artery anastomosis.  This was performed with running 5-0 Prolene suture.  Several bleeding points were controlled with interrupted 5-0 Prolene.  Once hemostasis of the proximal anastomosis had been obtained with the graft pressurized it was approximated to the profunda femoris.  The external iliac artery was then reclamped and the SFA was spatulated and then and SFA conduit to and profunda femoris anastomosis was fashioned using running 5-0 Prolene.  Again several bleeding points were controlled with interrupted pledgeted 5-0 Prolene suture.  The pledgets were created out of the remaining segment of SFA.  Hemostasis was achieved strong 2+ pulses noted in the profunda femoris.  We then irrigated the wound bed with a liter of Vashe.  Next working  laterally the sartorius muscle was identified.  Further debridement of the infected wound inflamed rind like tissue was performed to free the sartorius particularly in its proximal aspect.  The sartorius was then dissected all the way to the origin on the iliac crest.  It was then taken down with Bovie cautery.  Bleeding points were controlled with Bovie cautery.  The sartorius was then rotated to cover the arterial reconstruction in its entirety.  It was then approximated to the medial wall using interrupted 2-0 Vicryl suture.  Once this had been completed another liter of Vashe was used to irrigate the wound.  The wound was then measured it is 22 cm in length by 11 in width and 4 cm in depth.  This a medium VAC sponge was then placed in the bed of the wound.  Dressing was created with Ioban.  The VAC was then hooked to suction 125 mm continuous it was then reduced to 75 mm continuous.  Dressing was in the green.  Having achieved successful reconstruction and coverage with a sartorius muscle flap as well as an excellent seal of the VAC we elected to and the case.   COMPLICATIONS: None  CONDITION: Good  Cordella KANDICE Shawl, M.D.  [facility] Office: [phone]  12/11/2023, 5:16 PM

## 2023-12-13 NOTE — Plan of Care (Signed)
  Problem: Education: Goal: Knowledge of General Education information will improve Description: Including pain rating scale, medication(s)/side effects and non-pharmacologic comfort measures Outcome: Progressing   Problem: Clinical Measurements: Goal: Ability to maintain clinical measurements within normal limits will improve Outcome: Progressing Goal: Will remain free from infection Outcome: Progressing Goal: Diagnostic test results will improve Outcome: Progressing Goal: Respiratory complications will improve Outcome: Progressing Goal: Cardiovascular complication will be avoided Outcome: Progressing   Problem: Activity: Goal: Risk for activity intolerance will decrease Outcome: Progressing   Problem: Nutrition: Goal: Adequate nutrition will be maintained Outcome: Progressing   Problem: Coping: Goal: Level of anxiety will decrease Outcome: Progressing   Problem: Pain Managment: Goal: General experience of comfort will improve and/or be controlled Outcome: Progressing   Problem: Safety: Goal: Ability to remain free from injury will improve Outcome: Progressing   Problem: Elimination: Goal: Will not experience complications related to bowel motility Outcome: Not Progressing Goal: Will not experience complications related to urinary retention Outcome: Not Progressing

## 2023-12-13 NOTE — Progress Notes (Signed)
 Foley catheter removed per order and a male pure wick placed on patient. Patient instructed to void when he feels the urge to go and education provided concerning bladder scanning in the event he is unable to void within 4-6 hours after foley removed. Patient verbalized understanding.

## 2023-12-13 NOTE — Progress Notes (Signed)
 1 Day Post-Op   Subjective/Chief Complaint: Denies pain. Primary complaint is of lack of sleep. No issues overnight. Hemodynamically stable. Wound VAC with serosang drainage since postop. HgB 10.6 Patient with low urine output- bladder scanned.   Objective: Vital signs in last 24 hours: Temp:  [95.9 F (35.5 C)-97.9 F (36.6 C)] 97.9 F (36.6 C) (08/23 0845) Pulse Rate:  [57-89] 64 (08/23 1000) Resp:  [9-26] 13 (08/23 1000) BP: (75-146)/(47-102) 100/58 (08/23 1000) SpO2:  [84 %-100 %] 94 % (08/23 1000) Arterial Line BP: (166-186)/(67-76) 186/76 (08/22 1800) FiO2 (%):  [35 %-36 %] 35 % (08/23 0000) Last BM Date : 12/10/23  Intake/Output from previous day: 08/22 0701 - 08/23 0700 In: 1496.5 [P.O.:480; I.V.:411.5; Blood:334; IV Piggyback:271.1] Out: 2113 [Urine:1063; Drains:800; Blood:250] Intake/Output this shift: Total I/O In: -  Out: 200 [Urine:200]  General appearance: alert and no distress Cardio: regular rate and rhythm Extremities: RIGHT groin- Vac in place with good seal/suction, soft- VAC canister with serosang drainage, thigh. Abdomen soft  Lab Results:  Recent Labs    12/12/23 0313 12/12/23 0816 12/12/23 2242 12/13/23 0509  WBC 12.5*  --   --  13.3*  HGB 8.1*   < > 9.9* 10.6*  HCT 24.2*   < > 29.5* 32.8*  PLT 270  --   --  266   < > = values in this interval not displayed.   BMET Recent Labs    12/12/23 0816 12/13/23 0509  NA 135 132*  K 5.1 4.8  CL 107 105  CO2 22 19*  GLUCOSE 125* 140*  BUN 20 25*  CREATININE 1.03 1.23  CALCIUM  7.9* 8.1*   PT/INR Recent Labs    12/12/23 0816  LABPROT 16.8*  INR 1.3*   ABG Recent Labs    12/12/23 0745  PHART 7.4  HCO3 21.1    Studies/Results: DG Abd 1 View Result Date: 12/12/2023 CLINICAL DATA:  OG tube placement. EXAM: ABDOMEN - 1 VIEW COMPARISON:  None Available. FINDINGS: OG tube tip is in the gastric antrum. Gas is evident within the nondistended stomach. Scattered small bowel and  colonic gas identified in the visualized upper abdomen, nonobstructive pattern. IMPRESSION: OG tube tip is in the distal stomach. Electronically Signed   By: Camellia Candle M.D.   On: 12/12/2023 07:47   DG Chest Port 1 View Result Date: 12/12/2023 CLINICAL DATA:  Ventilator dependence. EXAM: PORTABLE CHEST 1 VIEW COMPARISON:  12/04/2023 FINDINGS: Endotracheal tube tip is 6.2 cm above the base of the carina. The NG tube passes into the stomach although the distal tip position is not included on the film. The cardio pericardial silhouette is enlarged. Retrocardiac left base collapse/consolidation is new in the interval, obscuring the left hemidiaphragm. Right lung clear. Telemetry leads overlie the chest. Degenerative changes are noted in both shoulders. IMPRESSION: 1. Interval development of retrocardiac left base collapse/consolidation. 2. Endotracheal tube tip is 6.2 cm above the base of the carina. Electronically Signed   By: Camellia Candle M.D.   On: 12/12/2023 07:47    Anti-infectives: Anti-infectives (From admission, onward)    Start     Dose/Rate Route Frequency Ordered Stop   12/12/23 0536  ceFAZolin  (ANCEF ) IVPB 2g/100 mL premix  Status:  Discontinued        2 g 200 mL/hr over 30 Minutes Intravenous 30 min pre-op 12/12/23 0536 12/12/23 0703   12/11/23 1709  ceFAZolin  (ANCEF ) IVPB 2g/100 mL premix  Status:  Discontinued  2 g 200 mL/hr over 30 Minutes Intravenous 30 min pre-op 12/11/23 1709 12/11/23 1826   12/11/23 1556  vancomycin  (VANCOCIN ) powder  Status:  Discontinued          As needed 12/11/23 1556 12/11/23 1718   12/11/23 1555  gentamicin  (GARAMYCIN ) injection  Status:  Discontinued          As needed 12/11/23 1556 12/11/23 1718   12/11/23 1553  ceFEPIme  (MAXIPIME ) injection  Status:  Discontinued          As needed 12/11/23 1554 12/11/23 1718   12/11/23 1430  ceFEPIme  (MAXIPIME ) 2 g in sodium chloride  0.9 % 100 mL IVPB  Status:  Discontinued       Note to Pharmacy: Just  powder please  MD to mix in OR   2 g 200 mL/hr over 30 Minutes Intravenous  Once 12/11/23 1420 12/11/23 1425   12/10/23 0900  ceFEPIme  (MAXIPIME ) 2 g in sodium chloride  0.9 % 100 mL IVPB        2 g 200 mL/hr over 30 Minutes Intravenous Every 8 hours 12/10/23 0720     12/09/23 0900  ceFEPIme  (MAXIPIME ) 2 g in sodium chloride  0.9 % 100 mL IVPB  Status:  Discontinued        2 g 200 mL/hr over 30 Minutes Intravenous Every 12 hours 12/09/23 0758 12/10/23 0720   12/07/23 1800  cefTRIAXone  (ROCEPHIN ) 2 g in sodium chloride  0.9 % 100 mL IVPB  Status:  Discontinued        2 g 200 mL/hr over 30 Minutes Intravenous Every 24 hours 12/07/23 1419 12/09/23 0758   12/06/23 0300  vancomycin  (VANCOREADY) IVPB 1250 mg/250 mL  Status:  Discontinued        1,250 mg 166.7 mL/hr over 90 Minutes Intravenous Every 24 hours 12/05/23 0103 12/05/23 0713   12/05/23 0145  ceFEPIme  (MAXIPIME ) 2 g in sodium chloride  0.9 % 100 mL IVPB  Status:  Discontinued        2 g 200 mL/hr over 30 Minutes Intravenous Every 12 hours 12/05/23 0049 12/07/23 1419   12/05/23 0145  vancomycin  (VANCOREADY) IVPB 2000 mg/400 mL        2,000 mg 200 mL/hr over 120 Minutes Intravenous  Once 12/05/23 0049 12/05/23 0400   12/05/23 0100  azithromycin  (ZITHROMAX ) 500 mg in sodium chloride  0.9 % 250 mL IVPB  Status:  Discontinued        500 mg 250 mL/hr over 60 Minutes Intravenous Every 24 hours 12/05/23 0003 12/05/23 0713   12/05/23 0000  ceFAZolin  (ANCEF ) IVPB 1 g/50 mL premix  Status:  Discontinued       Note to Pharmacy: Send with pt to OR   1 g 100 mL/hr over 30 Minutes Intravenous On call 12/04/23 1814 12/04/23 1922   12/04/23 2030  ceFAZolin  (ANCEF ) IVPB 2g/100 mL premix  Status:  Discontinued        2 g 200 mL/hr over 30 Minutes Intravenous  Once 12/04/23 2025 12/04/23 2122   12/04/23 1911  ceFAZolin  (ANCEF ) IVPB 1 g/50 mL premix        over 30 Minutes  Continuous PRN 12/04/23 1923 12/04/23 2032       Assessment/Plan: s/p  POD #1  Procedure(s): EVACUATION HEMATOMA (N/A) EXPLORATION, ARTERY, FEMORAL- stop bleeding with suture repair anastomaosis , abx beads (Right) APPLICATION, WOUND VAC (Right)  POD #2: Right external iliac artery to profunda femoris artery bypass with autologous ipsilateral superficial femoral artery; Resection of infected right common femoral  artery and removal of common femoral artery, profunda femoris artery, and superficial femoral artery stents; Harvest of right superficial femoral artery for bypass with endarterectomy of right superficial femoral artery to make it usable for bypass conduit; Right sartorius flap placement  Keep in ICU through weekend Monitor VAC drainage and RIGHT groin Bedrest   LOS: 9 days    Tisa Dakin A 12/13/2023

## 2023-12-13 NOTE — Anesthesia Postprocedure Evaluation (Signed)
 Anesthesia Post Note  Patient: Manuel [name]  Procedure(s) Performed: EVACUATION HEMATOMA EXPLORATION, ARTERY, FEMORAL- stop bleeding with suture repair anastomaosis , abx beads (Right: Groin) APPLICATION, WOUND VAC (Right: Groin)  Patient location during evaluation: SICU Anesthesia Type: General Level of consciousness: awake and alert Pain management: pain level controlled Vital Signs Assessment: post-procedure vital signs reviewed and stable Respiratory status: spontaneous breathing, nonlabored ventilation and respiratory function stable Cardiovascular status: stable Postop Assessment: no apparent nausea or vomiting Anesthetic complications: no   No notable events documented.   Last Vitals:  Vitals:   12/13/23 0630 12/13/23 0730  BP: 110/88 113/62  Pulse: 60 64  Resp: (!) 9 18  Temp:    SpO2: 98% 99%    Last Pain:  Vitals:   12/13/23 0600  TempSrc: Oral  PainSc: 0-No pain                 Prentice Murphy

## 2023-12-13 NOTE — Progress Notes (Signed)
 f Progress Note   Patient: Manuel [name] FMW:[id number] DOB: 04/27/1945 DOA: 12/04/2023     9 DOS: the patient was seen and examined on 12/13/2023   Brief hospital course:  Manuel [name] is a 78 y.o. 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 Patient fell with walker landing on top of him.  EMS was called for assistance.  Patient refused to be transported to ED for evaluation initial. However as time when on patient note more intense pain in right groin area and due to his EMS was called back and patient was transported to ED.      ED Course:  99.3/tmx 100.7 BP 131/72, hr 113, rr 19 sat 95%  UA :rare bacteria , ketones 20 ,  Wbc 11.6, hgb 14.3, plt 191, increase pmn Na 126 ( 137), K 5.3, cl91, glu 438, cr 1.12 Lactic 2.9 ,3.3 Trop 284,3403, 8119 EKG: sinus tachycardia , RAD twave change inferior lateral leads   Patient on evaluation found to have right femoral a rupture, vascular consulted and performed repair.     Patient course was further complicated by NTSEMI , s/p right femoral a repair patient was started on heparin  drip . Cardiology was consulted and will see patient in am , but plans for intervention  recommend medical management.    PCCM was involved for comanagement. TRH was consulted for admission and further management as below.   I assumed care on 12/10/23. Further hospital course and management as outlined below.    Assessment and Plan:  Right femoral artery Rupture s/p repair S/p right EIA/CFA stenting, PTA of PFA --Vascular surgery following  8/17 Increased opening of skin and subcutaneous tissue from prior hematoma.    See Op Notes for  details of surgeries this admission. Pt underwent hematoma evacuation and removal of prior bovine patch due to bacteremia.  Post op course complicated by hemorrhagic shock requiring return to the OR.   ID consulted, patient had prior infection with same bacteria, possible endovascular infection.   --Continue cefepime  --May need 4 to 6 weeks of IV antibiotics per ID   Hemorrhagic shock - now resolved Required pressors in ICU post-operatively --Maintain MAP>65   NSTEMI Hx of CAD s/p pic RCA c/b NSTEMI 06/2023 s/p stent x 3 to LAD TTE LVEF 20 to 25%, severely decreased function and global hypokinesis. Grade 1 DD.  RV systolic function is severely reduced. -Troponin peaked 8119 >> 7607 --Cardiology consulted, appreciate recommendations --Continue aspirin  and statin --8/15 started Plavix  --off heparin  drip    Chronic combined systolic/diastolic CHF  TTE as above.  Appears euvolemic GDMT limited by soft BP's. --Restart GDMT when BP permits --Follow up cardiology recommendations 8/19 BP improved today, started Coreg  6.25 mg BID     Sepsis due to Serratia bacteremia, ?Pneumonia -  Opacities on chest imaging /fever/mild leukocytosis/lactic acidosis -s/p Vanco azithromycin  and cefepime  MRSA PCR negative --ID consulted as above --Discontinued vancomycin  azithromycin  --Continue cefepime  for now Blood culture growing Serratia, marcescens, pansensitive. --Follow ID recommendations for d/c antibiotics     Uncontrolled type 2 diabetes with hyperglycemia-- Started Semglee  14 units nightly --NovoLog  sliding scale --Monitor CBG  Isotonic hyponatremia - resolved with IV hydration Most likely nutritional deficiency Serum osmolality 280 WNL --Monitor BMP     PAD -s/p PTA -resume asa/plavix     HTN  8/15 low blood pressure.   Started midodrine  10 mg TID that has been weaned off. Coreg  resumed 6.25 mg BID   AKI due to hypotension versus contrast. Resolved Metabolic acidosis  due to  above - given amp bicarb 8/15 Treated with IV fluids initially, mucomyst  x 2 days Monitor renal functions and urine output Check bladder scan Avoid nephrotoxic medications and use renally dose medications   Hyperkalemia secondary to AKI. Resolved  Lokelma  10 g x 2 doses given Monitor BMP daily     OSA -02 at bedtime  -pt declines CPAP    DJD of the spine -supportive care    Hx of ETOH abuse -monitored on ciwa - now outside window for w/d sx's   Constipation: continue bowel regimen      Subjective: Pt seen in stepdown unit this AM.  He discusses that he thinks he is at the end, feels like he stops breathing when he goes to sleep.  He declines for me to order a CPAP.  He tells me he wants to be DNR/DNI - see IPAL note for details of that discussion.  He currently denies pain, shortness of breath, chest pain, groin pain or other acute complaints.     Physical Exam: Vitals:   12/13/23 1300 12/13/23 1400 12/13/23 1500 12/13/23 1600  BP: 130/75 132/71 (!) 105/56 (!) 112/53  Pulse: 71 68 68 61  Resp: 17 16 20 12   Temp: 97.9 F (36.6 C)     TempSrc: Oral     SpO2: 97% 96% 96% 97%  Weight:      Height:       General exam: awake, alert, no acute distress, chronically ill appearing HEENT: moist mucus membranes, hearing grossly normal  Respiratory system: CTAB no wheezes, rales or rhonchi, normal respiratory effort. Cardiovascular system: normal S1/S2, RRR Gastrointestinal system: soft, NT, ND Central nervous system: A&O x4. no gross focal neurologic deficits, normal speech Extremities: wound vac to right groin is well-sealed, no surrounding swelling, R BKA, no edema, normal tone Skin: dry, intact, normal temperature Psychiatry: normal mood, congruent affect, judgement and insight appear normal    Data Reviewed:  Notable labs --   Na 132 Bicarb 19 Glucose 140 BUN 25 Ca 8.1 Hbg 9.9 >> 10.6 >> 10.4 WBC 13.3   Family Communication: Wife at bedside on rounds  8/20  Disposition: Status is: Inpatient Remains inpatient appropriate because: severity of illness as outlined above   Planned Discharge Destination: Home with Home Health    Time spent: 52 minutes  Author: Burnard DELENA Cunning, DO 12/13/2023 5:15 PM  For on call review [email or web address].

## 2023-12-13 NOTE — Progress Notes (Signed)
 Patient has had no urine output since foley catheter removed last night at 2200. Patient states that he does not feel like he has to void. Bladder scan shows 154 ml of urine in the bladder. Informed patient that we would continue to bladder scan him until he was able to have enough urine to in/out cath or he is able to void. Patient instructed to let RN know if he starts to feel pain/pressure or the need to void but unable to. Patient verbalized understanding. Will continue to monitor.

## 2023-12-14 DIAGNOSIS — I772 Rupture of artery: Secondary | ICD-10-CM | POA: Diagnosis not present

## 2023-12-14 LAB — TYPE AND SCREEN
ABO/RH(D): A POS
Antibody Screen: NEGATIVE
Unit division: 0
Unit division: 0
Unit division: 0
Unit division: 0
Unit division: 0
Unit division: 0

## 2023-12-14 LAB — BPAM RBC
Blood Product Expiration Date: 202508312359
Blood Product Expiration Date: 202508312359
Blood Product Expiration Date: 202509132359
Blood Product Expiration Date: 202509142359
Blood Product Expiration Date: 202509192359
Blood Product Expiration Date: 202509192359
ISSUE DATE / TIME: 202508220503
ISSUE DATE / TIME: 202508220541
ISSUE DATE / TIME: 202508220541
ISSUE DATE / TIME: 202508220629
Unit Type and Rh: 6200
Unit Type and Rh: 6200
Unit Type and Rh: 6200
Unit Type and Rh: 6200
Unit Type and Rh: 6200
Unit Type and Rh: 6200

## 2023-12-14 LAB — GLUCOSE, CAPILLARY
Glucose-Capillary: 188 mg/dL — ABNORMAL HIGH (ref 70–99)
Glucose-Capillary: 127 mg/dL — ABNORMAL HIGH (ref 70–99)
Glucose-Capillary: 147 mg/dL — ABNORMAL HIGH (ref 70–99)
Glucose-Capillary: 92 mg/dL (ref 70–99)
Glucose-Capillary: 102 mg/dL — ABNORMAL HIGH (ref 70–99)

## 2023-12-14 LAB — CBC
HCT: 28.8 % — ABNORMAL LOW (ref 39.0–52.0)
Hemoglobin: 9.6 g/dL — ABNORMAL LOW (ref 13.0–17.0)
MCH: 29.6 pg (ref 26.0–34.0)
MCHC: 33.3 g/dL (ref 30.0–36.0)
MCV: 88.9 fL (ref 80.0–100.0)
Platelets: 215 K/uL (ref 150–400)
RBC: 3.24 MIL/uL — ABNORMAL LOW (ref 4.22–5.81)
RDW: 16.9 % — ABNORMAL HIGH (ref 11.5–15.5)
WBC: 10 K/uL (ref 4.0–10.5)
nRBC: 0 % (ref 0.0–0.2)

## 2023-12-14 LAB — BASIC METABOLIC PANEL WITH GFR
Anion gap: 9 (ref 5–15)
BUN: 26 mg/dL — ABNORMAL HIGH (ref 8–23)
CO2: 20 mmol/L — ABNORMAL LOW (ref 22–32)
Calcium: 8.3 mg/dL — ABNORMAL LOW (ref 8.9–10.3)
Chloride: 105 mmol/L (ref 98–111)
Creatinine, Ser: 1.09 mg/dL (ref 0.61–1.24)
GFR, Estimated: >60 mL/min (ref >60)
Glucose, Bld: 179 mg/dL — ABNORMAL HIGH (ref 70–99)
Potassium: 4.4 mmol/L (ref 3.5–5.1)
Sodium: 134 mmol/L — ABNORMAL LOW (ref 135–145)

## 2023-12-14 MED ORDER — GLUCERNA SHAKE PO LIQD
237.0000 mL | Freq: Three times a day (TID) | ORAL | Status: DC
  Administered 2023-12-14 – 2024-01-01 (×41): 237 mL via ORAL

## 2023-12-14 NOTE — Progress Notes (Signed)
 PT Cancellation Note  Patient Details Name: Manuel [name] MRN: [id number] DOB: [birth year]   Cancelled Treatment:    Reason Eval/Treat Not Completed: Patient not medically ready.  PT consult received.  Per Vascular surgery note from today: Continue bedrest.  Will hold therapy today d/t this (nurse notified) and will re-attempt PT evaluation tomorrow if medically appropriate.  Damien Caulk, PT 12/14/23, 1:06 PM

## 2023-12-14 NOTE — Plan of Care (Signed)
  Problem: Education: Goal: Knowledge of General Education information will improve Description: Including pain rating scale, medication(s)/side effects and non-pharmacologic comfort measures Outcome: Progressing   Problem: Clinical Measurements: Goal: Ability to maintain clinical measurements within normal limits will improve Outcome: Progressing Goal: Will remain free from infection Outcome: Progressing Goal: Diagnostic test results will improve Outcome: Progressing Goal: Respiratory complications will improve Outcome: Progressing Goal: Cardiovascular complication will be avoided Outcome: Progressing   Problem: Activity: Goal: Risk for activity intolerance will decrease Outcome: Progressing   Problem: Nutrition: Goal: Adequate nutrition will be maintained Outcome: Progressing   Problem: Elimination: Goal: Will not experience complications related to bowel motility Outcome: Progressing Goal: Will not experience complications related to urinary retention Outcome: Progressing   Problem: Pain Managment: Goal: General experience of comfort will improve and/or be controlled Outcome: Progressing   Problem: Safety: Goal: Ability to remain free from injury will improve Outcome: Progressing   Problem: Coping: Goal: Level of anxiety will decrease Outcome: Not Progressing

## 2023-12-14 NOTE — Progress Notes (Signed)
 2 Days Post-Op   Subjective/Chief Complaint: Doing much better today. Now has appetite. Denies pain. Urine output improved. VAC- . HgB 9.6   Objective: Vital signs in last 24 hours: Temp:  [97.5 F (36.4 C)-97.9 F (36.6 C)] 97.5 F (36.4 C) (08/23 2000) Pulse Rate:  [57-77] 65 (08/24 0725) Resp:  [11-31] 22 (08/24 0725) BP: (75-143)/(53-84) 137/84 (08/24 0725) SpO2:  [89 %-100 %] 96 % (08/24 0725) Last BM Date : 12/10/23  Intake/Output from previous day: 08/23 0701 - 08/24 0700 In: 203 [I.V.:3; IV Piggyback:200] Out: 1850 [Urine:1200; Drains:650] Intake/Output this shift: Total I/O In: -  Out: 100 [Drains:100]  General appearance: alert and no distress Cardio: regular rate and rhythm Extremities: RIGHT Groin- soft, VAC in place with good seal and suction, VAC with serosang drainage-   Lab Results:  Recent Labs    12/13/23 0509 12/13/23 1259 12/14/23 0433  WBC 13.3*  --  10.0  HGB 10.6* 10.4* 9.6*  HCT 32.8* 31.5* 28.8*  PLT 266  --  215   BMET Recent Labs    12/13/23 0509 12/14/23 0433  NA 132* 134*  K 4.8 4.4  CL 105 105  CO2 19* 20*  GLUCOSE 140* 179*  BUN 25* 26*  CREATININE 1.23 1.09  CALCIUM  8.1* 8.3*   PT/INR Recent Labs    12/12/23 0816  LABPROT 16.8*  INR 1.3*   ABG Recent Labs    12/12/23 0745  PHART 7.4  HCO3 21.1    Studies/Results: No results found.  Anti-infectives: Anti-infectives (From admission, onward)    Start     Dose/Rate Route Frequency Ordered Stop   12/13/23 1500  ceFEPIme  (MAXIPIME ) 2 g in sodium chloride  0.9 % 100 mL IVPB        2 g 200 mL/hr over 30 Minutes Intravenous Every 12 hours 12/13/23 1413     12/12/23 0536  ceFAZolin  (ANCEF ) IVPB 2g/100 mL premix  Status:  Discontinued        2 g 200 mL/hr over 30 Minutes Intravenous 30 min pre-op 12/12/23 0536 12/12/23 0703   12/11/23 1709  ceFAZolin  (ANCEF ) IVPB 2g/100 mL premix  Status:  Discontinued        2 g 200 mL/hr over 30 Minutes Intravenous 30 min  pre-op 12/11/23 1709 12/11/23 1826   12/11/23 1556  vancomycin  (VANCOCIN ) powder  Status:  Discontinued          As needed 12/11/23 1556 12/11/23 1718   12/11/23 1555  gentamicin  (GARAMYCIN ) injection  Status:  Discontinued          As needed 12/11/23 1556 12/11/23 1718   12/11/23 1553  ceFEPIme  (MAXIPIME ) injection  Status:  Discontinued          As needed 12/11/23 1554 12/11/23 1718   12/11/23 1430  ceFEPIme  (MAXIPIME ) 2 g in sodium chloride  0.9 % 100 mL IVPB  Status:  Discontinued       Note to Pharmacy: Just powder please  MD to mix in OR   2 g 200 mL/hr over 30 Minutes Intravenous  Once 12/11/23 1420 12/11/23 1425   12/10/23 0900  ceFEPIme  (MAXIPIME ) 2 g in sodium chloride  0.9 % 100 mL IVPB  Status:  Discontinued        2 g 200 mL/hr over 30 Minutes Intravenous Every 8 hours 12/10/23 0720 12/13/23 1413   12/09/23 0900  ceFEPIme  (MAXIPIME ) 2 g in sodium chloride  0.9 % 100 mL IVPB  Status:  Discontinued  2 g 200 mL/hr over 30 Minutes Intravenous Every 12 hours 12/09/23 0758 12/10/23 0720   12/07/23 1800  cefTRIAXone  (ROCEPHIN ) 2 g in sodium chloride  0.9 % 100 mL IVPB  Status:  Discontinued        2 g 200 mL/hr over 30 Minutes Intravenous Every 24 hours 12/07/23 1419 12/09/23 0758   12/06/23 0300  vancomycin  (VANCOREADY) IVPB 1250 mg/250 mL  Status:  Discontinued        1,250 mg 166.7 mL/hr over 90 Minutes Intravenous Every 24 hours 12/05/23 0103 12/05/23 0713   12/05/23 0145  ceFEPIme  (MAXIPIME ) 2 g in sodium chloride  0.9 % 100 mL IVPB  Status:  Discontinued        2 g 200 mL/hr over 30 Minutes Intravenous Every 12 hours 12/05/23 0049 12/07/23 1419   12/05/23 0145  vancomycin  (VANCOREADY) IVPB 2000 mg/400 mL        2,000 mg 200 mL/hr over 120 Minutes Intravenous  Once 12/05/23 0049 12/05/23 0400   12/05/23 0100  azithromycin  (ZITHROMAX ) 500 mg in sodium chloride  0.9 % 250 mL IVPB  Status:  Discontinued        500 mg 250 mL/hr over 60 Minutes Intravenous Every 24 hours 12/05/23  0003 12/05/23 0713   12/05/23 0000  ceFAZolin  (ANCEF ) IVPB 1 g/50 mL premix  Status:  Discontinued       Note to Pharmacy: Send with pt to OR   1 g 100 mL/hr over 30 Minutes Intravenous On call 12/04/23 1814 12/04/23 1922   12/04/23 2030  ceFAZolin  (ANCEF ) IVPB 2g/100 mL premix  Status:  Discontinued        2 g 200 mL/hr over 30 Minutes Intravenous  Once 12/04/23 2025 12/04/23 2122   12/04/23 1911  ceFAZolin  (ANCEF ) IVPB 1 g/50 mL premix        over 30 Minutes  Continuous PRN 12/04/23 1923 12/04/23 2032       Assessment/Plan: s/p POD #2 Procedure(s): EVACUATION HEMATOMA (N/A) EXPLORATION, ARTERY, FEMORAL- stop bleeding with suture repair anastomaosis , abx beads (Right) APPLICATION, WOUND VAC (Right)  POD #3: Right external iliac artery to profunda femoris artery bypass with autologous ipsilateral superficial femoral artery; Resection of infected right common femoral artery and removal of common femoral artery, profunda femoris artery, and superficial femoral artery stents; Harvest of right superficial femoral artery for bypass with endarterectomy of right superficial femoral artery to make it usable for bypass conduit; Right sartorius flap placement    Keep in ICU today  Continue to monitor VAC, groin and HgB Continue Bedrest Encourage PO- Glucerna with meals  LOS: 10 days    Tisa Dakin A 12/14/2023

## 2023-12-14 NOTE — Progress Notes (Signed)
 f Progress Note   Patient: Manuel [name] FMW:[id number] DOB: 06-Jun-1945 DOA: 12/04/2023     10 DOS: the patient was seen and examined on 12/14/2023   Brief hospital course:  Manuel [name] is a 78 y.o. 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 Patient fell with walker landing on top of him.  EMS was called for assistance.  Patient refused to be transported to ED for evaluation initial. However as time when on patient note more intense pain in right groin area and due to his EMS was called back and patient was transported to ED.      ED Course:  99.3/tmx 100.7 BP 131/72, hr 113, rr 19 sat 95%  UA :rare bacteria , ketones 20 ,  Wbc 11.6, hgb 14.3, plt 191, increase pmn Na 126 ( 137), K 5.3, cl91, glu 438, cr 1.12 Lactic 2.9 ,3.3 Trop 284,3403, 8119 EKG: sinus tachycardia , RAD twave change inferior lateral leads   Patient on evaluation found to have right femoral a rupture, vascular consulted and performed repair.     Patient course was further complicated by NTSEMI , s/p right femoral a repair patient was started on heparin  drip . Cardiology was consulted and will see patient in am , but plans for intervention  recommend medical management.    PCCM was involved for comanagement. [facility] was consulted for admission and further management as below.   I assumed care on 12/10/23. Further hospital course and management as outlined below.    Assessment and Plan:  Right femoral artery Rupture s/p repair S/p right EIA/CFA stenting, PTA of PFA --Vascular surgery following  8/17 Increased opening of skin and subcutaneous tissue from prior hematoma.    See Op Notes for  details of surgeries this admission. Pt underwent hematoma evacuation and removal of prior bovine patch due to bacteremia.  Post op course complicated by hemorrhagic shock requiring return to the OR.   ID consulted, patient had prior infection with same bacteria, possible endovascular infection.   --Continue cefepime  --May need 4 to 6 weeks of IV antibiotics per ID   Hemorrhagic shock - now resolved Required pressors in ICU post-operatively --Maintain MAP>65   NSTEMI Hx of CAD s/p pic RCA c/b NSTEMI 06/2023 s/p stent x 3 to LAD TTE LVEF 20 to 25%, severely decreased function and global hypokinesis. Grade 1 DD.  RV systolic function is severely reduced. -Troponin peaked 8119 >> 7607 --Cardiology consulted, appreciate recommendations --Continue aspirin , Plavix  and statin --off heparin  drip    Chronic combined systolic/diastolic CHF  TTE as above.  Appears euvolemic GDMT limited by soft BP's. --Restart GDMT when BP permits --Follow up cardiology recommendations 8/19 BP improved today, started Coreg  6.25 mg BID     Severe Sepsis due to Serratia bacteremia, suspected CAP - POA with fever, tachycardia and lactic acidosis consistent with organ dysfunction. Blood culture growing Serratia, marcescens, pansensitive. Opacities on chest imaging /fever/mild leukocytosis/lactic acidosis -s/p Vanco azithromycin  and cefepime  MRSA PCR negative --ID consulted as above --Discontinued vancomycin  azithromycin  --Continue cefepime  for now  --Follow ID recommendations for d/c antibiotics     Uncontrolled type 2 diabetes with hyperglycemia--  Started Semglee  14 units nightly --NovoLog  sliding scale --Monitor CBG    Isotonic hyponatremia - resolved with IV hydration Most likely nutritional deficiency Serum osmolality 280 WNL --Monitor BMP     PAD -s/p PTA -resume asa/plavix     HTN  8/15 low blood pressure.   Started midodrine  10 mg TID that has been weaned off. Coreg  resumed 6.25 mg BID    AKI due to hypotension versus contrast. Resolved Metabolic acidosis  due to above - given amp bicarb 8/15 Treated with IV fluids initially, mucomyst  x 2 days Monitor renal functions and urine output Check bladder scan Avoid nephrotoxic medications and use renally dose medications   Hyperkalemia secondary to AKI. Resolved  Lokelma  10 g x 2 doses given Monitor BMP daily     OSA -02 at bedtime  -pt declines CPAP    DJD of the spine -supportive care    Hx of ETOH abuse -monitored on ciwa - now outside window for w/d sx's   Constipation: continue bowel regimen      Subjective: Pt seen with wife and son at bedside this AM in stepdown.  Pt in better spirits today, states he doesn't feel like he is actively dying any longer -- he did yesterday, very much.  He reports sleeping better overnight.  Pain contorlled with medications, and pt expresses concern pain might not be addressed as promptly when transferred back out to medical floor.  He denies other acute complaints.    Physical Exam: Vitals:   12/14/23 1300 12/14/23 1400 12/14/23 1500 12/14/23 1600  BP: (!) 140/89 139/73 132/67 133/60  Pulse: (!) 120 69 92 61  Resp: (!) 21 18 (!) 26 (!) 21  Temp:    98.3 F (36.8 C)  TempSrc:    Oral  SpO2: (!) 81% 100% 91% 100%  Weight:      Height:       General exam: awake, alert, no acute distress, chronically ill appearing HEENT: moist mucus membranes, hearing grossly normal  Respiratory system: CTAB no wheezes, rales or rhonchi, normal respiratory effort. Cardiovascular system: normal S1/S2, RRR Gastrointestinal system: soft, NT, ND Central nervous system: A&O x4. no gross focal neurologic deficits, normal speech Extremities: wound vac to right groin is well-sealed, no surrounding swelling, R BKA, no edema, normal tone Skin: dry, intact, normal temperature Psychiatry: normal mood, congruent affect, judgement and insight appear normal    Data Reviewed:  Notable labs --    Na 134 Bicarb 20 Glucose 179 BUN 26 Ca 8. Hbg 9.9 >> 10.6 >> 10.4>> 9.6    Family Communication: Wife and son at bedside on rounds this AM  Disposition: Status is: Inpatient Remains inpatient appropriate because: severity of illness as outlined above   Planned Discharge Destination: Home with Home Health    Time spent: 45 minutes  Author: Burnard DELENA Cunning, DO 12/14/2023 4:20 PM  For on call review [email or web address].

## 2023-12-15 ENCOUNTER — Encounter: Payer: Self-pay | Admitting: Vascular Surgery

## 2023-12-15 ENCOUNTER — Encounter: Admitting: Family

## 2023-12-15 DIAGNOSIS — Z7982 Long term (current) use of aspirin: Secondary | ICD-10-CM

## 2023-12-15 DIAGNOSIS — Z7901 Long term (current) use of anticoagulants: Secondary | ICD-10-CM

## 2023-12-15 DIAGNOSIS — I772 Rupture of artery: Secondary | ICD-10-CM | POA: Diagnosis not present

## 2023-12-15 LAB — BASIC METABOLIC PANEL WITH GFR
Anion gap: 7 (ref 5–15)
BUN: 25 mg/dL — ABNORMAL HIGH (ref 8–23)
CO2: 22 mmol/L (ref 22–32)
Calcium: 8.7 mg/dL — ABNORMAL LOW (ref 8.9–10.3)
Chloride: 105 mmol/L (ref 98–111)
Creatinine, Ser: 0.86 mg/dL (ref 0.61–1.24)
GFR, Estimated: >60 mL/min (ref >60)
Glucose, Bld: 117 mg/dL — ABNORMAL HIGH (ref 70–99)
Potassium: 4.6 mmol/L (ref 3.5–5.1)
Sodium: 134 mmol/L — ABNORMAL LOW (ref 135–145)

## 2023-12-15 LAB — CBC
HCT: 31.3 % — ABNORMAL LOW (ref 39.0–52.0)
Hemoglobin: 10 g/dL — ABNORMAL LOW (ref 13.0–17.0)
MCH: 29.6 pg (ref 26.0–34.0)
MCHC: 31.9 g/dL (ref 30.0–36.0)
MCV: 92.6 fL (ref 80.0–100.0)
Platelets: 230 K/uL (ref 150–400)
RBC: 3.38 MIL/uL — ABNORMAL LOW (ref 4.22–5.81)
RDW: 17 % — ABNORMAL HIGH (ref 11.5–15.5)
WBC: 11.2 K/uL — ABNORMAL HIGH (ref 4.0–10.5)
nRBC: 0 % (ref 0.0–0.2)

## 2023-12-15 LAB — MAGNESIUM: Magnesium: 2.2 mg/dL (ref 1.7–2.4)

## 2023-12-15 LAB — GLUCOSE, CAPILLARY
Glucose-Capillary: 182 mg/dL — ABNORMAL HIGH (ref 70–99)
Glucose-Capillary: 110 mg/dL — ABNORMAL HIGH (ref 70–99)
Glucose-Capillary: 100 mg/dL — ABNORMAL HIGH (ref 70–99)
Glucose-Capillary: 149 mg/dL — ABNORMAL HIGH (ref 70–99)
Glucose-Capillary: 199 mg/dL — ABNORMAL HIGH (ref 70–99)
Glucose-Capillary: 126 mg/dL — ABNORMAL HIGH (ref 70–99)
Glucose-Capillary: 129 mg/dL — ABNORMAL HIGH (ref 70–99)

## 2023-12-15 LAB — SURGICAL PATHOLOGY

## 2023-12-15 LAB — PREPARE RBC (CROSSMATCH)

## 2023-12-15 MED ORDER — CLONAZEPAM 0.5 MG PO TABS
0.5000 mg | ORAL_TABLET | Freq: Two times a day (BID) | ORAL | Status: DC | PRN
  Administered 2023-12-15 – 2023-12-24 (×11): 0.5 mg via ORAL
  Filled 2023-12-15 (×11): qty 1

## 2023-12-15 MED ORDER — ESCITALOPRAM OXALATE 10 MG PO TABS
5.0000 mg | ORAL_TABLET | Freq: Every day | ORAL | Status: DC
  Administered 2023-12-15 – 2023-12-31 (×15): 5 mg via ORAL
  Filled 2023-12-15 (×3): qty 1
  Filled 2023-12-15: qty 0.5
  Filled 2023-12-15 (×2): qty 1
  Filled 2023-12-15 (×2): qty 0.5
  Filled 2023-12-15 (×7): qty 1
  Filled 2023-12-15 (×2): qty 0.5

## 2023-12-15 MED ORDER — PANTOPRAZOLE SODIUM 40 MG PO TBEC
40.0000 mg | DELAYED_RELEASE_TABLET | Freq: Every day | ORAL | Status: DC
  Administered 2023-12-16 – 2024-01-01 (×15): 40 mg via ORAL
  Filled 2023-12-15 (×17): qty 1

## 2023-12-15 MED ORDER — SODIUM CHLORIDE 0.9 % IV SOLN
2.0000 g | Freq: Three times a day (TID) | INTRAVENOUS | Status: DC
  Administered 2023-12-15 – 2023-12-23 (×23): 2 g via INTRAVENOUS
  Filled 2023-12-15 (×29): qty 12.5

## 2023-12-15 NOTE — Progress Notes (Signed)
 Progress Note    12/15/2023 10:41 AM 3 Days Post-Op  Subjective:  Manuel [name] is an 78 yo male now POD #3 From:  PROCEDURE: 1.   Right groin reexploration for bleeding 2.   Evacuation of right groin hematoma 3.   Suture repair of proximal bypass anastomosis in the distal external iliac artery 4.   Replacement of the sartorius flap for coverage 5.   Placement of gentamicin  and vancomycin  antibiotic impregnated beads 6.   Negative pressure dressing placement right groin  Patient is resting comfortably in bed this morning in ICU eating breakfast.    Vitals:   12/15/23 0900 12/15/23 1000  BP: (!) 162/75 (!) 153/82  Pulse: 95 77  Resp: 18 19  Temp:    SpO2: (!) 82% 93%   Physical Exam: Cardiac:  RRR.  Normal S1 and S2 no murmurs appreciated. Lungs: Lungs on auscultation are rhonchorous throughout.  Diminished in the bases.  No rales or wheezing noted.  Normal labored breathing on 2 L nasal cannula oxygen today Incisions: Right groin postoperative incision with wound VAC in place working well. Extremities: Bilateral lower extremities warm to touch with Doppler pulses. Abdomen: Positive bowel sounds throughout, soft, nontender nondistended. Neurologic: Alert and oriented x 3, answers all questions follows commands appropriately  CBC    Component Value Date/Time   WBC 11.2 (H) 12/15/2023 0520   RBC 3.38 (L) 12/15/2023 0520   HGB 10.0 (L) 12/15/2023 0520   HGB 15.7 02/16/2012 2013   HCT 31.3 (L) 12/15/2023 0520   HCT 44.4 02/16/2012 2013   PLT 230 12/15/2023 0520   PLT 176 02/16/2012 2013   MCV 92.6 12/15/2023 0520   MCV 91 02/16/2012 2013   MCH 29.6 12/15/2023 0520   MCHC 31.9 12/15/2023 0520   RDW 17.0 (H) 12/15/2023 0520   RDW 13.0 02/16/2012 2013   LYMPHSABS 0.5 (L) 12/04/2023 1141   MONOABS 0.5 12/04/2023 1141   EOSABS 0.1 12/04/2023 1141   BASOSABS 0.0 12/04/2023 1141    BMET    Component Value Date/Time   NA 134 (L) 12/15/2023 0520   NA 137 07/21/2023  1246   NA 137 02/16/2012 2013   K 4.6 12/15/2023 0520   K 3.8 02/16/2012 2013   CL 105 12/15/2023 0520   CL 102 02/16/2012 2013   CO2 22 12/15/2023 0520   CO2 25 02/16/2012 2013   GLUCOSE 117 (H) 12/15/2023 0520   GLUCOSE 154 (H) 02/16/2012 2013   BUN 25 (H) 12/15/2023 0520   BUN 30 (H) 07/21/2023 1246   BUN 10 02/16/2012 2013   CREATININE 0.86 12/15/2023 0520   CREATININE 0.72 02/16/2012 2013   CALCIUM  8.7 (L) 12/15/2023 0520   CALCIUM  9.6 02/16/2012 2013   GFRNONAA >60 12/15/2023 0520   GFRNONAA >60 02/16/2012 2013   GFRAA >60 12/09/2018 0929   GFRAA >60 02/16/2012 2013    INR    Component Value Date/Time   INR 1.3 (H) 12/12/2023 0816     Intake/Output Summary (Last 24 hours) at 12/15/2023 1041 Last data filed at 12/15/2023 0952 Gross per 24 hour  Intake 923 ml  Output 3775 ml  Net -2852 ml     Assessment/Plan:  78 y.o. male is s/p SEE ABOVE  3 Days Post-Op   PLAN Keep in ICU until first wound VAC change. First wound VAC change planned for Wednesday morning 12/17/2023. Okay to work with physical therapy and Occupational Therapy. Encourage p.o. intake with high-protein with meals.  DVT prophylaxis: ASA a  81 mg daily, Plavix  75 mg, and Lovenox  40 mg subcu every 24   Manuel [name] [facility] 12/15/2023 10:41 AM

## 2023-12-15 NOTE — Evaluation (Signed)
 Physical Therapy Re-Evaluation Patient Details Name: Manuel [name] MRN: [id number] DOB: 03-05-1946 Today's Date: 12/15/2023  History of Present Illness  Patient is a 78 year old male presenting after fall while getting out of bed to wheelchair.  Found to have right femoral a rupture, vascular consulted and performed repair. Complicated by Hemorrhagic shock requiring ICU and pressors. PMH: CHF, CAD, PAD, R BKA, HTN, gout  Clinical Impression  Patient is agreeable to PT and wants to sit up. He reports minimal right groin pain with movement. Patient required assistance for bed mobility. Initiated lateral scooting along the side of bed with cues for technique and assistance required. Recommend to continue PT to maximize independence and decrease caregiver burden. He wants to return home at discharge. Anticipate patient will require caregiver assistance for safe transition home.       If plan is discharge home, recommend the following: A little help with walking and/or transfers;A little help with bathing/dressing/bathroom;Assist for transportation;Help with stairs or ramp for entrance;Assistance with cooking/housework   Can travel by private vehicle        Equipment Recommendations None recommended by PT  Recommendations for Other Services       Functional Status Assessment Patient has had a recent decline in their functional status and demonstrates the ability to make significant improvements in function in a reasonable and predictable amount of time.     Precautions / Restrictions Precautions Precautions: Fall Recall of Precautions/Restrictions: Intact Precaution/Restrictions Comments: wound vac right groin Restrictions Weight Bearing Restrictions Per Provider Order: No      Mobility  Bed Mobility Overal bed mobility: Needs Assistance Bed Mobility: Supine to Sit, Sit to Supine     Supine to sit: Min assist, +2 for physical assistance Sit to supine: Min assist, +2 for  physical assistance   General bed mobility comments: cues for technique, task initiation    Transfers Overall transfer level: Needs assistance Equipment used: None Transfers: Bed to chair/wheelchair/BSC            Lateral/Scoot Transfers: Mod assist, +2 physical assistance General transfer comment: incremental lateral scooting performed along edge of bed x 4 bouts with cues for technique. standing not performed    Ambulation/Gait               General Gait Details: not attempted  Stairs            Wheelchair Mobility     Tilt Bed    Modified Rankin (Stroke Patients Only)       Balance Overall balance assessment: Needs assistance Sitting-balance support: Feet unsupported, Single extremity supported, No upper extremity supported Sitting balance-Leahy Scale: Fair                                       Pertinent Vitals/Pain Pain Assessment Pain Assessment: Faces Faces Pain Scale: Hurts a little bit Pain Location: right groin with movement Pain Descriptors / Indicators: Discomfort Pain Intervention(s): Limited activity within patient's tolerance, Monitored during session, Repositioned    Home Living Family/patient expects to be discharged to:: Private residence Living Arrangements: Spouse/significant other Available Help at Discharge: Family;Available 24 hours/day Type of Home: House Home Access: Ramped entrance     Alternate Level Stairs-Number of Steps: 15 Home Layout: Able to live on main level with bedroom/bathroom;Two level;Laundry or work area in [employer] Equipment: Agricultural consultant (2 wheels);BSC/3in1;Wheelchair - manual;Cane - single point;Tub bench;Crutches Additional Comments:  Pt reports that he has a prosthesis    Prior Function Prior Level of Function : Independent/Modified Independent             Mobility Comments: Pt reports he transfers confidently to/from w/c and uses prosthesis with crutches for short  distances. Recent fall during transfer resutling in hospitalization. ADLs Comments: Pt reports that he is indepenent with ADLs .     Extremity/Trunk Assessment   Upper Extremity Assessment Upper Extremity Assessment: Defer to OT evaluation    Lower Extremity Assessment Lower Extremity Assessment: Generalized weakness;LLE deficits/detail RLE Deficits / Details: right BKA, patient reports edema. can activate hip and knee movement LLE Deficits / Details: can complete LAQ in sitting position. generalized weakness throughout       Communication   Communication Communication: Impaired Factors Affecting Communication: Hearing impaired    Cognition Arousal: Alert Behavior During Therapy: WFL for tasks assessed/performed   PT - Cognitive impairments: No apparent impairments, No family/caregiver present to determine baseline                       PT - Cognition Comments: patient does need cues for attention to task at times Following commands: Impaired Following commands impaired: Follows one step commands with increased time     Cueing Cueing Techniques: Verbal cues     General Comments General comments (skin integrity, edema, etc.): patient in the chair position in bed at end of session. vitals stable throughout session with no dizziness reported with mobility    Exercises     Assessment/Plan    PT Assessment Patient needs continued PT services  PT Problem List Decreased strength;Decreased activity tolerance;Decreased range of motion;Decreased balance;Decreased mobility       PT Treatment Interventions DME instruction;Gait training;Functional mobility training;Therapeutic activities;Therapeutic exercise;Balance training;Patient/family education;Wheelchair mobility training    PT Goals (Current goals can be found in the Care Plan section)  Acute Rehab PT Goals Patient Stated Goal: to go home PT Goal Formulation: With patient Time For Goal Achievement:  12/29/23 Potential to Achieve Goals: Good    Frequency Min 2X/week     Co-evaluation PT/OT/SLP Co-Evaluation/Treatment: Yes Reason for Co-Treatment: Complexity of the patient's impairments (multi-system involvement);To address functional/ADL transfers PT goals addressed during session: Mobility/safety with mobility         AM-PAC PT 6 Clicks Mobility  Outcome Measure Help needed turning from your back to your side while in a flat bed without using bedrails?: A Little Help needed moving from lying on your back to sitting on the side of a flat bed without using bedrails?: A Lot Help needed moving to and from a bed to a chair (including a wheelchair)?: A Lot Help needed standing up from a chair using your arms (e.g., wheelchair or bedside chair)?: A Lot Help needed to walk in hospital room?: Total Help needed climbing 3-5 steps with a railing? : Total 6 Click Score: 11    End of Session   Activity Tolerance: Patient tolerated treatment well Patient left: in bed;with call bell/phone within reach Nurse Communication: Mobility status PT Visit Diagnosis: Difficulty in walking, not elsewhere classified (R26.2);Muscle weakness (generalized) (M62.81)    Time: 1030-1055 PT Time Calculation (min) (ACUTE ONLY): 25 min   Charges:   PT Evaluation $PT Re-evaluation: 1 Re-eval   PT General Charges $$ ACUTE PT VISIT: 1 Visit         Randine Essex, PT, MPT   Randine LULLA Essex 12/15/2023, 11:43 AM

## 2023-12-15 NOTE — Progress Notes (Signed)
   12/15/23 1430  Spiritual Encounters  Type of Visit Initial  Care provided to: Pt and family (Wife is at bedside)  Referral source Chaplain assessment  Reason for visit Routine spiritual support  OnCall Visit No  Spiritual Framework  Presenting Themes Meaning/purpose/sources of inspiration;Coping tools;Impactful experiences and emotions  Interventions  Spiritual Care Interventions Made Established relationship of care and support;Compassionate presence;Reflective listening;Narrative/life review;Meaning making  Intervention Outcomes  Outcomes Connection to spiritual care;Awareness around self/spiritual resourses;Awareness of support

## 2023-12-15 NOTE — Progress Notes (Signed)
 PHARMACIST - PHYSICIAN COMMUNICATION  DR:   Fausto  CONCERNING: IV to Oral Route Change Policy  RECOMMENDATION: This patient is receiving thiamine  and pantoprazole  by the intravenous route.  Based on criteria approved by the Pharmacy and Therapeutics Committee, the intravenous medication(s) is/are being converted to the equivalent oral dose form(s).   DESCRIPTION: These criteria include: The patient is eating (either orally or via tube) and/or has been taking other orally administered medications for a least 24 hours The patient has no evidence of active gastrointestinal bleeding or impaired GI absorption (gastrectomy, short bowel, patient on TNA or NPO).  If you have questions about this conversion, please contact the Pharmacy Department  []   [phone] )  [facility] [x]   [phone] )  [facility] []   [phone] )  [facility] []   [phone] )  [facility] []   [phone] )  [facility]   Adriana JONETTA Bolster, [facility] 12/15/2023 2:21 PM

## 2023-12-15 NOTE — Evaluation (Signed)
Occupational Therapy Re-Evaluation Patient Details Name: Manuel [name] MRN: [id number] DOB: 03/29/1946 Today's Date: 12/15/2023   History of Present Illness   Patient is a 78 year old male presenting after fall while getting out of bed to wheelchair.  Found to have right femoral a rupture, vascular consulted and performed repair. Complicated by Hemorrhagic shock requiring ICU and pressors. PMH: CHF, CAD, PAD, R BKA, HTN, gout    Clinical Impressions [name] was seen for OT/PT re-evaluation on this date. Upon arrival to room pt in bed, agreeable to tx. Pt requires MIN A x2 exit bed. MOD A x2 lateral scoot along EOB - anticipate will improve when not on air bed and LLE can reach floor. Educated on seated HEP and importance of sitting for functional strengthening. Goals remain appropriate, will continue to follow POC. Discharge recommendation remains appropriate.      If plan is discharge home, recommend the following:   A little help with walking and/or transfers;A little help with bathing/dressing/bathroom;Assistance with cooking/housework;Direct supervision/assist for medications management;Direct supervision/assist for financial management;Assist for transportation     Functional Status Assessment   Patient has had a recent decline in their functional status and demonstrates the ability to make significant improvements in function in a reasonable and predictable amount of time.     Equipment Recommendations   None recommended by OT     Recommendations for Other Services         Precautions/Restrictions   Precautions Precautions: Fall Recall of Precautions/Restrictions: Intact Precaution/Restrictions Comments: wound vac right groin Restrictions Weight Bearing Restrictions Per Provider Order: No     Mobility Bed Mobility Overal bed mobility: Needs Assistance Bed Mobility: Supine to Sit, Sit to Supine     Supine to sit: Min assist, +2 for physical  assistance Sit to supine: Min assist, +2 for physical assistance        Transfers Overall transfer level: Needs assistance Equipment used: None Transfers: Bed to chair/wheelchair/BSC            Lateral/Scoot Transfers: Mod assist, +2 physical assistance General transfer comment: lateral scoot along EOB      Balance Overall balance assessment: Needs assistance Sitting-balance support: Feet unsupported, Single extremity supported, No upper extremity supported Sitting balance-Leahy Scale: Fair                                     ADL either performed or assessed with clinical judgement   ADL Overall ADL's : Needs assistance/impaired                                       General ADL Comments: MAX A don L sock in sitting. MOD A x2 simulated BSC t/f     Vision         Perception         Praxis         Pertinent Vitals/Pain Pain Assessment Pain Assessment: 0-10 Pain Score: 2  Pain Location: right groin with movement Pain Descriptors / Indicators: Discomfort Pain Intervention(s): Limited activity within patient's tolerance     Extremity/Trunk Assessment Upper Extremity Assessment Upper Extremity Assessment: Overall WFL for tasks assessed   Lower Extremity Assessment Lower Extremity Assessment: Generalized weakness RLE Deficits / Details: right BKA, patient reports edema. can activate hip and knee movement LLE Deficits / Details: can  complete LAQ in sitting position. generalized weakness throughout       Communication Communication Communication: Impaired Factors Affecting Communication: Hearing impaired   Cognition Arousal: Alert Behavior During Therapy: WFL for tasks assessed/performed Cognition: No family/caregiver present to determine baseline             OT - Cognition Comments: verbose                 Following commands: Impaired Following commands impaired: Follows one step commands with increased  time     Cueing  General Comments   Cueing Techniques: Verbal cues  patient in the chair position in bed at end of session. vitals stable throughout session with no dizziness reported with mobility   Exercises     Shoulder Instructions      Home Living Family/patient expects to be discharged to:: Private residence Living Arrangements: Spouse/significant other Available Help at Discharge: Family;Available 24 hours/day Type of Home: House Home Access: Ramped entrance     Home Layout: Able to live on main level with bedroom/bathroom;Two level;Laundry or work area in Artist of Steps: 15 Alternate Level Stairs-Rails: Right;Left Bathroom Shower/Tub: Chief Strategy Officer: Handicapped height Bathroom Accessibility: Yes   Home Equipment: Agricultural consultant (2 wheels);BSC/3in1;Wheelchair - manual;Cane - single point;Tub bench;Crutches   Additional Comments: Pt reports that he has a prosthesis      Prior Functioning/Environment Prior Level of Function : Independent/Modified Independent             Mobility Comments: Pt reports he transfers confidently to/from w/c and uses prosthesis with crutches for short distances. Recent fall during transfer resutling in hospitalization. ADLs Comments: Pt reports that he is indepenent with ADLs .    OT Problem List: Decreased strength;Impaired balance (sitting and/or standing);Decreased safety awareness;Decreased activity tolerance   OT Treatment/Interventions: Self-care/ADL training;Therapeutic exercise;Energy conservation;Therapeutic activities      OT Goals(Current goals can be found in the care plan section)   Acute Rehab OT Goals Patient Stated Goal: to go home OT Goal Formulation: With patient/family Time For Goal Achievement: 12/29/23 Potential to Achieve Goals: Fair   OT Frequency:  Min 2X/week    Co-evaluation PT/OT/SLP Co-Evaluation/Treatment: Yes Reason for Co-Treatment:  Complexity of the patient's impairments (multi-system involvement);To address functional/ADL transfers PT goals addressed during session: Mobility/safety with mobility OT goals addressed during session: ADL's and self-care      AM-PAC OT 6 Clicks Daily Activity     Outcome Measure Help from another person eating meals?: None Help from another person taking care of personal grooming?: None Help from another person toileting, which includes using toliet, bedpan, or urinal?: A Lot Help from another person bathing (including washing, rinsing, drying)?: A Lot Help from another person to put on and taking off regular upper body clothing?: A Little Help from another person to put on and taking off regular lower body clothing?: A Lot 6 Click Score: 17   End of Session Equipment Utilized During Treatment: Oxygen Nurse Communication: Mobility status  Activity Tolerance: Patient tolerated treatment well Patient left: in bed;with call bell/phone within reach  OT Visit Diagnosis: Unsteadiness on feet (R26.81);Repeated falls (R29.6)                Time: 8968-8945 OT Time Calculation (min): 23 min Charges:  OT General Charges $OT Visit: 1 Visit OT Evaluation $OT Re-eval: 1 Re-eval OT Treatments $Self Care/Home Management : 8-22 mins  Elston Slot, M.S. OTR/L  12/15/23, 1:49 PM  ascom  [phone]  

## 2023-12-15 NOTE — Progress Notes (Signed)
 PHARMACY NOTE:  ANTIMICROBIAL RENAL DOSAGE ADJUSTMENT  Current antimicrobial regimen includes a mismatch between antimicrobial dosage and estimated renal function.  As per policy approved by the Pharmacy & Therapeutics and Medical Executive Committees, the antimicrobial dosage will be adjusted accordingly.  Current antimicrobial dosage:  cefepime  2gm IV q12h  Indication: serratia bacteremia and endovascular infection  Renal Function:  Estimated Creatinine Clearance: 76 mL/min (by C-G formula based on SCr of 0.86 mg/dL). []      On intermittent HD, scheduled: []      On CRRT    Antimicrobial dosage has been changed to:  cefepime  2gm IV q8h  Additional comments:   Thank you for allowing pharmacy to be a part of this patient's care.  Remedios Mckone, PharmD, BCPS, BCIDP Work Cell: [phone] 12/15/2023 12:56 PM

## 2023-12-15 NOTE — Plan of Care (Signed)

## 2023-12-15 NOTE — Progress Notes (Addendum)
 Good day. Sat on the side of the bed with PT/OT. Complained about not being able to sleep. Talked to everyone that came by. 1433 Medicated for phantom right leg pain.  Also medicated for pain and nerves at 1733. He does well most of the time but he does not like being alone. His wife works an odd schedule to accommodate his needs. 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

## 2023-12-15 NOTE — Progress Notes (Signed)
 f Progress Note   Patient: Manuel [name] FMW:[id number] DOB: 04-02-1946 DOA: 12/04/2023     11 DOS: the patient was seen and examined on 12/15/2023   Brief hospital course:  LISTER BRIZZI is a 78 y.o. 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 Patient fell with walker landing on top of him.  EMS was called for assistance.  Patient refused to be transported to ED for evaluation initial. However as time when on patient note more intense pain in right groin area and due to his EMS was called back and patient was transported to ED.      ED Course:  99.3/tmx 100.7 BP 131/72, hr 113, rr 19 sat 95%  UA :rare bacteria , ketones 20 ,  Wbc 11.6, hgb 14.3, plt 191, increase pmn Na 126 ( 137), K 5.3, cl91, glu 438, cr 1.12 Lactic 2.9 ,3.3 Trop 284,3403, 8119 EKG: sinus tachycardia , RAD twave change inferior lateral leads   Patient on evaluation found to have right femoral a rupture, vascular consulted and performed repair.     Patient course was further complicated by NTSEMI , s/p right femoral a repair patient was started on heparin  drip . Cardiology was consulted and will see patient in am , but plans for intervention  recommend medical management.    PCCM was involved for comanagement. TRH was consulted for admission and further management as below.   I assumed care on 12/10/23. Further hospital course and management as outlined below.    Assessment and Plan:  Right femoral artery Rupture s/p repair S/p right EIA/CFA stenting, PTA of PFA 8/17 Increased opening of skin and subcutaneous tissue from prior hematoma.    See Op Notes for details of surgeries this  admission. Pt underwent hematoma evacuation and removal of prior bovine patch due to bacteremia.  Post op course complicated by hemorrhagic shock requiring return to the OR.    --Vascular surgery following  --First wound VAC change planned for Wednesday morning 12/17/2023  --Close monitoring in stepdown until vascular okay to transfer to floor  ID consulted, patient had prior infection with same bacteria, possible endovascular infection.   --Continue cefepime  --May need 4 to 6 weeks of IV antibiotics per ID   Hemorrhagic shock - now resolved Required pressors in ICU post-operatively --Maintain MAP>65   NSTEMI Hx of CAD s/p pic RCA c/b NSTEMI 06/2023 s/p stent x 3 to LAD TTE LVEF 20 to 25%, severely decreased function and global hypokinesis. Grade 1 DD.  RV systolic function is severely reduced. -Troponin peaked 8119 >> 7607 --Cardiology consulted, appreciate recommendations --Continue aspirin , Plavix  and statin --off heparin  drip    Chronic combined systolic/diastolic CHF  TTE as above.  Appears euvolemic GDMT limited by soft BP's. --Restart GDMT when BP permits --Follow up cardiology recommendations 8/19 BP improved today, started Coreg  6.25 mg BID     Severe Sepsis due to Serratia bacteremia, suspected CAP - POA with fever, tachycardia and lactic acidosis consistent with organ dysfunction. Blood culture growing Serratia, marcescens, pansensitive. Opacities on chest imaging /fever/mild leukocytosis/lactic acidosis -s/p Vanco azithromycin  and cefepime  MRSA PCR negative --ID consulted as above --Discontinued  vancomycin  azithromycin  --Continue cefepime  for now  --Follow ID recommendations for d/c antibiotics     Uncontrolled type 2 diabetes with hyperglycemia-- Started Semglee  14 units nightly --NovoLog  sliding scale --Monitor CBG    Isotonic hyponatremia - resolved with IV hydration Most likely nutritional deficiency Serum osmolality 280 WNL --Monitor BMP     PAD  -s/p PTA -resume asa/plavix     HTN  8/15 low blood pressure.   Started midodrine  10 mg TID that has been weaned off. Coreg  resumed 6.25 mg BID   AKI due to hypotension versus contrast. Resolved Metabolic acidosis  due to above - given amp bicarb 8/15 Treated with IV fluids initially, mucomyst  x 2 days Monitor renal functions and urine output Check bladder scan Avoid nephrotoxic medications and use renally dose medications   Hyperkalemia secondary to AKI. Resolved  Lokelma  10 g x 2 doses given Monitor BMP daily     OSA -02 at bedtime if not using CPAP -pt now agreeable to try CPAP at bedtime - ordered   DJD of the spine -supportive care    Hx of ETOH abuse -monitored on ciwa - now outside window for w/d sx's   Constipation: continue bowel regimen      Subjective: Pt seen awake sitting up in bed today. He asks to have CPAP ordered for nighttime, stating he knows he needs it.  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   Physical Exam: Vitals:   12/15/23 1500 12/15/23 1600 12/15/23 1700 12/15/23 1740  BP: (!) 147/77 (!) 148/67 (!) 157/87 (!) 157/87  Pulse: 80 69 82 77  Resp: 18 18 20    Temp:  98.5 F (36.9 C)    TempSrc:      SpO2: 95% 100% 95%   Weight:      Height:       General exam: awake, alert, no acute distress, chronically ill appearing HEENT: moist mucus membranes, hearing grossly normal  Respiratory system: lungs clear, normal respiratory effort on room air. Cardiovascular system: normal S1/S2, RRR Gastrointestinal system: soft, NT, ND Central nervous system: A&O x4. no gross focal neurologic deficits, normal speech Extremities: wound vac to right groin is well-sealed, no surrounding swelling, R BKA, no edema, normal tone Skin: dry, intact, normal temperature Psychiatry: normal mood, congruent affect, judgement and insight appear normal    Data Reviewed:  Notable  labs --   Na 134 Glucose 117 BUN 25 Ca 8.7 Hbg 9.9 >> 10.6 >> 10.4>> 9.6 >> 10.0 WBC 11.21 CBG's overall at goal   Family Communication: Wife and son at bedside on rounds 8/24  Disposition: Status is: Inpatient Remains inpatient appropriate because: severity of illness as outlined above   Planned Discharge Destination: Home with Home Health    Time spent: 45 minutes  Author: Burnard DELENA Cunning, DO 12/15/2023 5:58 PM  For on call review [email or web address].

## 2023-12-16 DIAGNOSIS — I772 Rupture of artery: Secondary | ICD-10-CM | POA: Diagnosis not present

## 2023-12-16 LAB — AEROBIC/ANAEROBIC CULTURE W GRAM STAIN (SURGICAL/DEEP WOUND): Gram Stain: NONE SEEN

## 2023-12-16 LAB — CBC
HCT: 28.8 % — ABNORMAL LOW (ref 39.0–52.0)
Hemoglobin: 9.4 g/dL — ABNORMAL LOW (ref 13.0–17.0)
MCH: 29.6 pg (ref 26.0–34.0)
MCHC: 32.6 g/dL (ref 30.0–36.0)
MCV: 90.6 fL (ref 80.0–100.0)
Platelets: 204 K/uL (ref 150–400)
RBC: 3.18 MIL/uL — ABNORMAL LOW (ref 4.22–5.81)
RDW: 16.6 % — ABNORMAL HIGH (ref 11.5–15.5)
WBC: 11.2 K/uL — ABNORMAL HIGH (ref 4.0–10.5)
nRBC: 0 % (ref 0.0–0.2)

## 2023-12-16 LAB — GLUCOSE, CAPILLARY
Glucose-Capillary: 95 mg/dL (ref 70–99)
Glucose-Capillary: 72 mg/dL (ref 70–99)
Glucose-Capillary: 87 mg/dL (ref 70–99)
Glucose-Capillary: 161 mg/dL — ABNORMAL HIGH (ref 70–99)
Glucose-Capillary: 120 mg/dL — ABNORMAL HIGH (ref 70–99)

## 2023-12-16 NOTE — Progress Notes (Addendum)
 Progress Note    12/16/2023 9:16 AM 4 Days Post-Op  Subjective:   Manuel [name] is an 78 yo male now POD #4 From:   PROCEDURE: 1.   Right groin reexploration for bleeding 2.   Evacuation of right groin hematoma 3.   Suture repair of proximal bypass anastomosis in the distal external iliac artery 4.   Replacement of the sartorius flap for coverage 5.   Placement of gentamicin  and vancomycin  antibiotic impregnated beads 6.   Negative pressure dressing placement right groin   Patient is resting comfortably in bed this morning in ICU eating breakfast. Patients wife is at his side this morning. No complaints overnight and vitals all remain stable.    Vitals:   12/16/23 0500 12/16/23 0600  BP: 127/72 135/76  Pulse:  63  Resp:    Temp:    SpO2: 96% 90%   Physical Exam: Cardiac:  RRR.  Normal S1 and S2 no murmurs appreciated. Lungs: Lungs on auscultation are rhonchorous throughout.  Diminished in the bases.  No rales or wheezing noted.  Normal labored breathing on 2 L nasal cannula oxygen today Incisions: Right groin postoperative incision with wound VAC in place working well. Extremities: Bilateral lower extremities warm to touch with Doppler pulses. Abdomen: Positive bowel sounds throughout, soft, nontender nondistended. Neurologic: Alert and oriented x 3, answers all questions follows commands appropriately  CBC    Component Value Date/Time   WBC 11.2 (H) 12/16/2023 0534   RBC 3.18 (L) 12/16/2023 0534   HGB 9.4 (L) 12/16/2023 0534   HGB 15.7 02/16/2012 2013   HCT 28.8 (L) 12/16/2023 0534   HCT 44.4 02/16/2012 2013   PLT 204 12/16/2023 0534   PLT 176 02/16/2012 2013   MCV 90.6 12/16/2023 0534   MCV 91 02/16/2012 2013   MCH 29.6 12/16/2023 0534   MCHC 32.6 12/16/2023 0534   RDW 16.6 (H) 12/16/2023 0534   RDW 13.0 02/16/2012 2013   LYMPHSABS 0.5 (L) 12/04/2023 1141   MONOABS 0.5 12/04/2023 1141   EOSABS 0.1 12/04/2023 1141   BASOSABS 0.0 12/04/2023 1141    BMET     Component Value Date/Time   NA 134 (L) 12/15/2023 0520   NA 137 07/21/2023 1246   NA 137 02/16/2012 2013   K 4.6 12/15/2023 0520   K 3.8 02/16/2012 2013   CL 105 12/15/2023 0520   CL 102 02/16/2012 2013   CO2 22 12/15/2023 0520   CO2 25 02/16/2012 2013   GLUCOSE 117 (H) 12/15/2023 0520   GLUCOSE 154 (H) 02/16/2012 2013   BUN 25 (H) 12/15/2023 0520   BUN 30 (H) 07/21/2023 1246   BUN 10 02/16/2012 2013   CREATININE 0.86 12/15/2023 0520   CREATININE 0.72 02/16/2012 2013   CALCIUM  8.7 (L) 12/15/2023 0520   CALCIUM  9.6 02/16/2012 2013   GFRNONAA >60 12/15/2023 0520   GFRNONAA >60 02/16/2012 2013   GFRAA >60 12/09/2018 0929   GFRAA >60 02/16/2012 2013    INR    Component Value Date/Time   INR 1.3 (H) 12/12/2023 0816     Intake/Output Summary (Last 24 hours) at 12/16/2023 0916 Last data filed at 12/16/2023 0535 Gross per 24 hour  Intake 690 ml  Output 2150 ml  Net -1460 ml     Assessment/Plan:  79 y.o. male is s/p SEE ABOVE  4 Days Post-Op   PLAN Keep in ICU until first wound VAC change. First wound VAC change planned for Wednesday morning 12/17/2023. Okay to work with physical  therapy and Occupational Therapy. Encourage p.o. intake with high-protein with meals.  I discussed in detail this morning with the patient and his wife at the bedside the procedure, benefits, risk, complications.  They both verbalized understanding and wished to proceed as soon as possible.  Patient will be made n.p.o. after midnight tonight for procedure tomorrow.  Patient will continue on his aspirin  and his Plavix  but we can hold Lovenox  tomorrow morning.   DVT prophylaxis: ASA a 81 mg daily, Plavix  75 mg, and Lovenox  40 mg subcu every 24    Melissa Pulido R [name] 12/16/2023 9:16 AM

## 2023-12-16 NOTE — Progress Notes (Signed)
 Date of Admission:  12/04/2023      ID: CAID Manuel [name] is a 78 y.o. male  Principal Problem:   Rupture of femoral artery (HCC) Active Problems:   Ischemic cardiomyopathy   Coronary artery disease involving native coronary artery of native heart   Gram-negative bacteremia   Serratia infection   Vascular graft infection (HCC)   Hemorrhagic shock (HCC)   Right external iliac artery to profunda femoris bypass with autologous ipsilateral superficial femoral artery.  Resection of infected right common femoral artery and removal of common femoral artery profunda femoris and superficial femoral artery stents. Negative pressure dressing  Subjective: Pt states he slept well with CPAP last night But this morning not too good Says he has gas in his abdomen  Medications:   aspirin  EC  81 mg Oral Daily   atorvastatin   80 mg Oral QHS   bisacodyl   10 mg Oral QHS   carvedilol   6.25 mg Oral BID WC   Chlorhexidine  Gluconate Cloth  6 each Topical Daily   clopidogrel   75 mg Oral Daily   docusate sodium   100 mg Oral BID   enoxaparin  (LOVENOX ) injection  40 mg Subcutaneous Q24H   escitalopram   5 mg Oral Daily   feeding supplement (GLUCERNA SHAKE)  237 mL Oral TID BM   folic acid   1 mg Oral Daily   gabapentin   100 mg Oral QHS   insulin  aspart  0-15 Units Subcutaneous Q4H   insulin  glargine-yfgn  14 Units Subcutaneous Q2200   pantoprazole   40 mg Oral Daily   polyethylene glycol  17 g Oral BID   sodium chloride  flush  3 mL Intravenous Q12H   thiamine   100 mg Oral Daily    Objective: Vital signs in last 24 hours: BP (!) 145/72   Pulse 73   Temp 98.3 F (36.8 C) (Oral)   Resp 18   Ht 5' 8 (1.727 m)   Wt 93.4 kg   SpO2 95%   BMI 31.31 kg/m        PHYSICAL EXAM:  General: awake, alert, pale Lungs: b/l air entry Heart: s1s2 Extremities: Right groin area has wound VAC Neurologic: moves all limbs  Lab Results    Latest Ref Rng & Units 12/16/2023    5:34 AM 12/15/2023    5:20  AM 12/14/2023    4:33 AM  CBC  WBC 4.0 - 10.5 K/uL 11.2  11.2  10.0   Hemoglobin 13.0 - 17.0 g/dL 9.4  89.9  9.6   Hematocrit 39.0 - 52.0 % 28.8  31.3  28.8   Platelets 150 - 400 K/uL 204  230  215        Latest Ref Rng & Units 12/15/2023    5:20 AM 12/14/2023    4:33 AM 12/13/2023    5:09 AM  CMP  Glucose 70 - 99 mg/dL 882  820  859   BUN 8 - 23 mg/dL 25  26  25    Creatinine 0.61 - 1.24 mg/dL 9.13  8.90  8.76   Sodium 135 - 145 mmol/L 134  134  132   Potassium 3.5 - 5.1 mmol/L 4.6  4.4  4.8   Chloride 98 - 111 mmol/L 105  105  105   CO2 22 - 32 mmol/L 22  20  19    Calcium  8.9 - 10.3 mg/dL 8.7  8.3  8.1       Microbiology: 12/04/23 BC X 2 serratia 11/28/23 BC- NG  so far WC-  x 2 serratia One of which is ceftriaxone  resistant   Assessment/Plan:  Right common femoral artery graft with rupture and leaking into the cutaneous tissue forming a hematoma.  Was taken for surgery underwent recent excision of the old graft and portions of the femoral artery and has a bypass graft with autologous external femoral artery.  Multiple cultures have been sent. Gram neg rod in gram stain  Serratia bacteremia.  Source is  the right groin bovine graft.  S/p removal of the graft Patient is on cefepime  Repeat blood culture neg  Underwent  Right external iliac artery to profunda femoris bypass with autologous ipsilateral superficial femoral artery.  Resection of infected right common femoral artery and removal of common femoral artery profunda femoris and superficial femoral artery stents. Negative pressure dressing. Surgical Cultures X2  serratia   Anemia  Severe PAD History of bilateral endarterectomies of the right And left common femoral artery, profunda artery and superficial femoral artery, Fogarty embolectomy of right SFA, stent placement bilateral common iliac arteries, left external artery and right PTA on 11/15/2022.  Also had bovine pericardial patch angioplasties.  This was complicated  by wound dehiscence on the right groin in September 2024.  Underwent debridement superficial culture from the right groin in October 2024 was Serratia along with Staphylococcus aureus and he received 14 days of appropriate antibiotics.  At that time the endovascular graft infection was not a concern    Right BKA  Left leg edema with superficial wounds and possible cellulitis He has to keep the leg elevated when he is sitting.    CAD Diabetes mellitus  Discussed the management with patient and wife

## 2023-12-16 NOTE — Plan of Care (Signed)
  Problem: Clinical Measurements: Goal: Ability to maintain clinical measurements within normal limits will improve Outcome: Progressing Goal: Will remain free from infection Outcome: Progressing Goal: Respiratory complications will improve Outcome: Progressing Goal: Cardiovascular complication will be avoided Outcome: Progressing   Problem: Coping: Goal: Level of anxiety will decrease Outcome: Not Progressing   Problem: Elimination: Goal: Will not experience complications related to bowel motility Outcome: Progressing Goal: Will not experience complications related to urinary retention Outcome: Progressing   Problem: Pain Managment: Goal: General experience of comfort will improve and/or be controlled Outcome: Progressing   Problem: Safety: Goal: Ability to remain free from injury will improve Outcome: Progressing

## 2023-12-16 NOTE — Progress Notes (Signed)
 Occupational Therapy Treatment Patient Details Name: Manuel [name] MRN: [id number] DOB: 10-11-1945 Today's Date: 12/16/2023   History of present illness Patient is a 78 year old male presenting after fall while getting out of bed to wheelchair.  Found to have right femoral a rupture, vascular consulted and performed repair. Complicated by Hemorrhagic shock requiring ICU and pressors. PMH: CHF, CAD, PAD, R BKA, HTN, gout   OT comments  Chart reviewed to date, pt greeted semi supine in bed, agreeable to OT tx session targeting improving functional activity tolerance in prep for ADL tasks. Co tx completed with PT on this date. Pt is making progress towards goals, as evidenced by performing bed mobility with MIN A +1-2, static sitting for approx 15 minutes with supervision, then remained at edge of bed to finish breakfast. Nurse aware and pt vss throughout, no c/o dizziness. Wound vac intact pre/post session. OT will continue to follow.       If plan is discharge home, recommend the following:  A little help with walking and/or transfers;A little help with bathing/dressing/bathroom;Assistance with cooking/housework;Direct supervision/assist for medications management;Direct supervision/assist for financial management;Assist for transportation   Equipment Recommendations  None recommended by OT    Recommendations for Other Services      Precautions / Restrictions Precautions Precautions: Fall Recall of Precautions/Restrictions: Intact Precaution/Restrictions Comments: wound vac right groin Restrictions Weight Bearing Restrictions Per Provider Order: No       Mobility Bed Mobility Overal bed mobility: Needs Assistance Bed Mobility: Supine to Sit, Sit to Supine     Supine to sit: Min assist, +2 for physical assistance Sit to supine: Min assist, +2 for physical assistance   General bed mobility comments: frequent cueing for technique    Transfers                    General transfer comment: lateral scoots up the bed to the right with MIN-MOD A +2     Balance Overall balance assessment: Needs assistance Sitting-balance support: Feet unsupported, No upper extremity supported Sitting balance-Leahy Scale: Good                                     ADL either performed or assessed with clinical judgement   ADL Overall ADL's : Needs assistance/impaired Eating/Feeding: Set up;Sitting   Grooming: Set up;Sitting               Lower Body Dressing: Maximal assistance                      Extremity/Trunk Assessment              Vision       Perception     Praxis     Communication Communication Communication: Impaired Factors Affecting Communication: Hearing impaired   Cognition Arousal: Alert Behavior During Therapy: WFL for tasks assessed/performed Cognition: Cognition impaired         Attention impairment (select first level of impairment): Sustained attention                     Following commands: Impaired Following commands impaired: Follows one step commands with increased time      Cueing   Cueing Techniques: Verbal cues  Exercises Other Exercises Other Exercises: edu re: role of OT, role of rehab, discharge recommendations    Shoulder Instructions  General Comments vss throughout    Pertinent Vitals/ Pain       Pain Assessment Pain Assessment: Faces Faces Pain Scale: Hurts a little bit Pain Location: right groin with movement Pain Descriptors / Indicators: Discomfort Pain Intervention(s): Monitored during session, Limited activity within patient's tolerance, Repositioned  Home Living                                          Prior Functioning/Environment              Frequency  Min 2X/week        Progress Toward Goals  OT Goals(current goals can now be found in the care plan section)  Progress towards OT goals: Progressing toward  goals  Acute Rehab OT Goals Time For Goal Achievement: 12/29/23  Plan      Co-evaluation    PT/OT/SLP Co-Evaluation/Treatment: Yes Reason for Co-Treatment: Complexity of the patient's impairments (multi-system involvement);To address functional/ADL transfers PT goals addressed during session: Mobility/safety with mobility OT goals addressed during session: ADL's and self-care      AM-PAC OT 6 Clicks Daily Activity     Outcome Measure   Help from another person eating meals?: None Help from another person taking care of personal grooming?: None Help from another person toileting, which includes using toliet, bedpan, or urinal?: A Lot Help from another person bathing (including washing, rinsing, drying)?: A Lot Help from another person to put on and taking off regular upper body clothing?: A Little Help from another person to put on and taking off regular lower body clothing?: A Lot 6 Click Score: 17    End of Session Equipment Utilized During Treatment: Oxygen  OT Visit Diagnosis: Unsteadiness on feet (R26.81);Repeated falls (R29.6)   Activity Tolerance Patient tolerated treatment well   Patient Left Other (comment);with family/visitor present (sitting on edge of bed)   Nurse Communication Mobility status        Time: 1043-1106 OT Time Calculation (min): 23 min  Charges: OT General Charges $OT Visit: 1 Visit OT Treatments $Therapeutic Activity: 8-22 mins Therisa Sheffield, OTD OTR/L  12/16/23, 12:25 PM

## 2023-12-16 NOTE — Progress Notes (Signed)
 Physical Therapy Treatment Patient Details Name: Manuel [name] MRN: [id number] DOB: 1945/05/14 Today's Date: 12/16/2023   History of Present Illness Patient is a 78 year old male presenting after fall while getting out of bed to wheelchair.  Found to have right femoral a rupture, vascular consulted and performed repair. Complicated by Hemorrhagic shock requiring ICU and pressors. PMH: CHF, CAD, PAD, R BKA, HTN, gout    PT Comments  Patient agreeable to PT session. Seen with OT to maximize outcomes. Patient continues to need assistance for bed mobility. Continued with lateral scooting transfer training in preparation for out of bed activity. Patient is still hopeful for return home. Recommend to continue PT to maximize independence and facilitate return to prior level of function. Anticipate patient will need caregiver support for safe transition home when ready.    If plan is discharge home, recommend the following: A little help with walking and/or transfers;A little help with bathing/dressing/bathroom;Assist for transportation;Help with stairs or ramp for entrance;Assistance with cooking/housework   Can travel by private vehicle        Equipment Recommendations  None recommended by PT    Recommendations for Other Services       Precautions / Restrictions Precautions Precautions: Fall Recall of Precautions/Restrictions: Intact Precaution/Restrictions Comments: wound vac right groin Restrictions Weight Bearing Restrictions Per Provider Order: No     Mobility  Bed Mobility Overal bed mobility: Needs Assistance Bed Mobility: Supine to Sit, Sit to Supine     Supine to sit: Min assist, +2 for physical assistance     General bed mobility comments: cues for technique. assistance for trunk and BLE support. increased time and effort required    Transfers Overall transfer level: Needs assistance Equipment used: None Transfers: Bed to chair/wheelchair/BSC             Lateral/Scoot Transfers: Min assist, Mod assist, +2 physical assistance General transfer comment: cues for technique. incremental scooting to the right    Ambulation/Gait                   Stairs             Wheelchair Mobility     Tilt Bed    Modified Rankin (Stroke Patients Only)       Balance Overall balance assessment: Needs assistance Sitting-balance support: Feet unsupported, No upper extremity supported Sitting balance-Leahy Scale: Good                                      Communication Communication Communication: Impaired Factors Affecting Communication: Hearing impaired  Cognition Arousal: Alert Behavior During Therapy: WFL for tasks assessed/performed   PT - Cognitive impairments: No apparent impairments, No family/caregiver present to determine baseline                         Following commands: Impaired      Cueing Cueing Techniques: Verbal cues  Exercises      General Comments General comments (skin integrity, edema, etc.): patient requesting to remain seated on edge of bed at end of session. spouse and another family member present. nurse aware. no significant change in heart rate noted with activity, no dizziness reported, and no increased pain reported with movement      Pertinent Vitals/Pain Pain Assessment Pain Assessment: Faces Faces Pain Scale: Hurts a little bit Pain Location: right groin with movement Pain Descriptors /  Indicators: Discomfort Pain Intervention(s): Limited activity within patient's tolerance, Monitored during session, Repositioned    Home Living                          Prior Function            PT Goals (current goals can now be found in the care plan section) Acute Rehab PT Goals Patient Stated Goal: to go home PT Goal Formulation: With patient Time For Goal Achievement: 12/29/23 Potential to Achieve Goals: Good Progress towards PT goals: Progressing toward  goals    Frequency    Min 2X/week      PT Plan      Co-evaluation PT/OT/SLP Co-Evaluation/Treatment: Yes Reason for Co-Treatment: Complexity of the patient's impairments (multi-system involvement);To address functional/ADL transfers PT goals addressed during session: Mobility/safety with mobility        AM-PAC PT 6 Clicks Mobility   Outcome Measure  Help needed turning from your back to your side while in a flat bed without using bedrails?: A Little Help needed moving from lying on your back to sitting on the side of a flat bed without using bedrails?: A Lot Help needed moving to and from a bed to a chair (including a wheelchair)?: A Lot Help needed standing up from a chair using your arms (e.g., wheelchair or bedside chair)?: A Lot Help needed to walk in hospital room?: Total Help needed climbing 3-5 steps with a railing? : Total 6 Click Score: 11    End of Session     Patient left: with family/visitor present (seated on bed per patient preference) Nurse Communication: Mobility status PT Visit Diagnosis: Difficulty in walking, not elsewhere classified (R26.2);Muscle weakness (generalized) (M62.81)     Time: 1040-1106 PT Time Calculation (min) (ACUTE ONLY): 26 min  Charges:    $Therapeutic Activity: 8-22 mins PT General Charges $$ ACUTE PT VISIT: 1 Visit                     Randine Essex, PT, MPT   Randine LULLA Essex 12/16/2023, 11:57 AM

## 2023-12-16 NOTE — Plan of Care (Signed)
 Problem: Education: Goal: Knowledge of General Education information will improve Description: Including pain rating scale, medication(s)/side effects and non-pharmacologic comfort measures Outcome: Not Progressing   Problem: Health Behavior/Discharge Planning: Goal: Ability to manage health-related needs will improve Outcome: Not Progressing   Problem: Clinical Measurements: Goal: Ability to maintain clinical measurements within normal limits will improve Outcome: Not Progressing Goal: Will remain free from infection Outcome: Not Progressing Goal: Diagnostic test results will improve Outcome: Not Progressing Goal: Respiratory complications will improve Outcome: Not Progressing Goal: Cardiovascular complication will be avoided Outcome: Not Progressing   Problem: Activity: Goal: Risk for activity intolerance will decrease Outcome: Not Progressing   Problem: Nutrition: Goal: Adequate nutrition will be maintained Outcome: Not Progressing   Problem: Coping: Goal: Level of anxiety will decrease Outcome: Not Progressing   Problem: Elimination: Goal: Will not experience complications related to bowel motility Outcome: Not Progressing Goal: Will not experience complications related to urinary retention Outcome: Not Progressing   Problem: Pain Managment: Goal: General experience of comfort will improve and/or be controlled Outcome: Not Progressing   Problem: Safety: Goal: Ability to remain free from injury will improve Outcome: Not Progressing   Problem: Skin Integrity: Goal: Risk for impaired skin integrity will decrease Outcome: Not Progressing   Problem: Education: Goal: Ability to describe self-care measures that may prevent or decrease complications (Diabetes Survival Skills Education) will improve Outcome: Not Progressing Goal: Individualized Educational Video(s) Outcome: Not Progressing   Problem: Coping: Goal: Ability to adjust to condition or change in  health will improve Outcome: Not Progressing   Problem: Fluid Volume: Goal: Ability to maintain a balanced intake and output will improve Outcome: Not Progressing   Problem: Health Behavior/Discharge Planning: Goal: Ability to identify and utilize available resources and services will improve Outcome: Not Progressing Goal: Ability to manage health-related needs will improve Outcome: Not Progressing   Problem: Metabolic: Goal: Ability to maintain appropriate glucose levels will improve Outcome: Not Progressing   Problem: Nutritional: Goal: Maintenance of adequate nutrition will improve Outcome: Not Progressing Goal: Progress toward achieving an optimal weight will improve Outcome: Not Progressing   Problem: Skin Integrity: Goal: Risk for impaired skin integrity will decrease Outcome: Not Progressing   Problem: Tissue Perfusion: Goal: Adequacy of tissue perfusion will improve Outcome: Not Progressing   Problem: Education: Goal: Understanding of CV disease, CV risk reduction, and recovery process will improve Outcome: Not Progressing Goal: Individualized Educational Video(s) Outcome: Not Progressing   Problem: Activity: Goal: Ability to return to baseline activity level will improve Outcome: Not Progressing   Problem: Cardiovascular: Goal: Ability to achieve and maintain adequate cardiovascular perfusion will improve Outcome: Not Progressing Goal: Vascular access site(s) Level 0-1 will be maintained Outcome: Not Progressing   Problem: Health Behavior/Discharge Planning: Goal: Ability to safely manage health-related needs after discharge will improve Outcome: Not Progressing   Problem: Education: Goal: Ability to describe self-care measures that may prevent or decrease complications (Diabetes Survival Skills Education) will improve Outcome: Not Progressing Goal: Individualized Educational Video(s) Outcome: Not Progressing   Problem: Cardiac: Goal: Ability to  maintain an adequate cardiac output will improve Outcome: Not Progressing   Problem: Health Behavior/Discharge Planning: Goal: Ability to identify and utilize available resources and services will improve Outcome: Not Progressing Goal: Ability to manage health-related needs will improve Outcome: Not Progressing   Problem: Fluid Volume: Goal: Ability to achieve a balanced intake and output will improve Outcome: Not Progressing   Problem: Metabolic: Goal: Ability to maintain appropriate glucose levels will improve Outcome: Not Progressing  Problem: Nutritional: Goal: Maintenance of adequate nutrition will improve Outcome: Not Progressing Goal: Maintenance of adequate weight for body size and type will improve Outcome: Not Progressing   Problem: Respiratory: Goal: Will regain and/or maintain adequate ventilation Outcome: Not Progressing   Problem: Urinary Elimination: Goal: Ability to achieve and maintain adequate renal perfusion and functioning will improve Outcome: Not Progressing

## 2023-12-16 NOTE — H&P (View-Only) (Signed)
 Progress Note    12/16/2023 9:16 AM 4 Days Post-Op  Subjective:   Roxy Filler is an 78 yo male now POD #4 From:   PROCEDURE: 1.   Right groin reexploration for bleeding 2.   Evacuation of right groin hematoma 3.   Suture repair of proximal bypass anastomosis in the distal external iliac artery 4.   Replacement of the sartorius flap for coverage 5.   Placement of gentamicin  and vancomycin  antibiotic impregnated beads 6.   Negative pressure dressing placement right groin   Patient is resting comfortably in bed this morning in ICU eating breakfast. Patients wife is at his side this morning. No complaints overnight and vitals all remain stable.    Vitals:   12/16/23 0500 12/16/23 0600  BP: 127/72 135/76  Pulse:  63  Resp:    Temp:    SpO2: 96% 90%   Physical Exam: Cardiac:  RRR.  Normal S1 and S2 no murmurs appreciated. Lungs: Lungs on auscultation are rhonchorous throughout.  Diminished in the bases.  No rales or wheezing noted.  Normal labored breathing on 2 L nasal cannula oxygen today Incisions: Right groin postoperative incision with wound VAC in place working well. Extremities: Bilateral lower extremities warm to touch with Doppler pulses. Abdomen: Positive bowel sounds throughout, soft, nontender nondistended. Neurologic: Alert and oriented x 3, answers all questions follows commands appropriately  CBC    Component Value Date/Time   WBC 11.2 (H) 12/16/2023 0534   RBC 3.18 (L) 12/16/2023 0534   HGB 9.4 (L) 12/16/2023 0534   HGB 15.7 02/16/2012 2013   HCT 28.8 (L) 12/16/2023 0534   HCT 44.4 02/16/2012 2013   PLT 204 12/16/2023 0534   PLT 176 02/16/2012 2013   MCV 90.6 12/16/2023 0534   MCV 91 02/16/2012 2013   MCH 29.6 12/16/2023 0534   MCHC 32.6 12/16/2023 0534   RDW 16.6 (H) 12/16/2023 0534   RDW 13.0 02/16/2012 2013   LYMPHSABS 0.5 (L) 12/04/2023 1141   MONOABS 0.5 12/04/2023 1141   EOSABS 0.1 12/04/2023 1141   BASOSABS 0.0 12/04/2023 1141    BMET     Component Value Date/Time   NA 134 (L) 12/15/2023 0520   NA 137 07/21/2023 1246   NA 137 02/16/2012 2013   K 4.6 12/15/2023 0520   K 3.8 02/16/2012 2013   CL 105 12/15/2023 0520   CL 102 02/16/2012 2013   CO2 22 12/15/2023 0520   CO2 25 02/16/2012 2013   GLUCOSE 117 (H) 12/15/2023 0520   GLUCOSE 154 (H) 02/16/2012 2013   BUN 25 (H) 12/15/2023 0520   BUN 30 (H) 07/21/2023 1246   BUN 10 02/16/2012 2013   CREATININE 0.86 12/15/2023 0520   CREATININE 0.72 02/16/2012 2013   CALCIUM  8.7 (L) 12/15/2023 0520   CALCIUM  9.6 02/16/2012 2013   GFRNONAA >60 12/15/2023 0520   GFRNONAA >60 02/16/2012 2013   GFRAA >60 12/09/2018 0929   GFRAA >60 02/16/2012 2013    INR    Component Value Date/Time   INR 1.3 (H) 12/12/2023 0816     Intake/Output Summary (Last 24 hours) at 12/16/2023 0916 Last data filed at 12/16/2023 0535 Gross per 24 hour  Intake 690 ml  Output 2150 ml  Net -1460 ml     Assessment/Plan:  79 y.o. male is s/p SEE ABOVE  4 Days Post-Op   PLAN Keep in ICU until first wound VAC change. First wound VAC change planned for Wednesday morning 12/17/2023. Okay to work with physical  therapy and Occupational Therapy. Encourage p.o. intake with high-protein with meals.  I discussed in detail this morning with the patient and his wife at the bedside the procedure, benefits, risk, complications.  They both verbalized understanding and wished to proceed as soon as possible.  Patient will be made n.p.o. after midnight tonight for procedure tomorrow.  Patient will continue on his aspirin  and his Plavix  but we can hold Lovenox  tomorrow morning.   DVT prophylaxis: ASA a 81 mg daily, Plavix  75 mg, and Lovenox  40 mg subcu every 24    Melissa Pulido R [name] 12/16/2023 9:16 AM

## 2023-12-16 NOTE — Progress Notes (Signed)
 f Progress Note   Patient: Manuel [name] FMW:[id number] DOB: 1946-04-20 DOA: 12/04/2023     12 DOS: the patient was seen and examined on 12/16/2023   Brief hospital course:  Manuel [name] is a 78 y.o. 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 Patient fell with walker landing on top of him.  EMS was called for assistance.  Patient refused to be transported to ED for evaluation initial. However as time when on patient note more intense pain in right groin area and due to his EMS was called back and patient was transported to ED.      ED Course:  99.3/tmx 100.7 BP 131/72, hr 113, rr 19 sat 95%  UA :rare bacteria , ketones 20 ,  Wbc 11.6, hgb 14.3, plt 191, increase pmn Na 126 ( 137), K 5.3, cl91, glu 438, cr 1.12 Lactic 2.9 ,3.3 Trop 284,3403, 8119 EKG: sinus tachycardia , RAD twave change inferior lateral leads   Patient on evaluation found to have right femoral a rupture, vascular consulted and performed repair.     Patient course was further complicated by NTSEMI , s/p right femoral a repair patient was started on heparin  drip . Cardiology was consulted and will see patient in am , but [no] plans for intervention  recommend medical management.    PCCM was involved for comanagement. TRH was consulted for admission and further management as below.   I assumed care on 12/10/23. Further hospital course and management as outlined below.    Assessment and Plan:  Right femoral artery Rupture s/p repair S/p right EIA/CFA stenting, PTA of PFA 8/17 Increased opening of skin and subcutaneous tissue from prior hematoma.    See Op Notes for details of vascular surgeries  this admission.  In summary, after the initial femoral artery repair, pt had to return to OR for hematoma evacuation and removal of prior bovine patch due to bacteremia and likely endovascular infection.  Post op course complicated by hemorrhagic shock and re-bleeding, requiring emergent return to the OR.  Pt remained intubated post-operatively, required pressors and resuscitation with RBC's.  TRH resumed care again on 8/23.  --Vascular surgery following  --First wound VAC change planned for Wednesday morning 12/17/2023 in the OR --Close monitoring in stepdown until vascular okay to transfer to floor    Hemorrhagic shock - now resolved Required pressors in ICU post-operatively --Maintain MAP>65    Severe Sepsis due to Serratia bacteremia, suspected CAP - POA with fever, tachycardia and lactic acidosis consistent with organ dysfunction. Blood culture growing Serratia, marcescens, pansensitive. Opacities on chest imaging /fever/mild leukocytosis/lactic acidosis -s/p Vanco azithromycin  and cefepime  MRSA PCR negative ID consulted, patient had prior infection with same bacteria, possible endovascular infection.   --Discontinued vancomycin  azithromycin  --Continue cefepime   --Continue cefepime  --May need 4 to 6 weeks of IV antibiotics per ID    NSTEMI Hx of CAD s/p pic RCA c/b NSTEMI 06/2023 s/p stent x 3 to LAD TTE LVEF 20 to 25%, severely decreased function and global hypokinesis. Grade 1 DD.  RV systolic function is severely reduced. -Troponin peaked 8119 >> 7607 --Cardiology consulted, appreciate recommendations --Continue aspirin , Plavix   and statin --off heparin  drip    Chronic combined systolic/diastolic CHF  TTE as above.  Appears euvolemic GDMT limited by soft BP's. --Restart GDMT when BP permits --Follow up cardiology recommendations 8/19 BP improved, started Coreg  6.25 mg BID    Uncontrolled type 2 diabetes with hyperglycemia-- --Started Semglee  14 units nightly --NovoLog   sliding scale --Monitor CBG    Isotonic hyponatremia - resolved with IV hydration --Monitor BMP     PAD -s/p PTA, right BKA -resumed on asa/plavix     HTN  8/15 low blood pressure.   Started midodrine  10 mg TID that has been weaned off. Coreg  resumed 6.25 mg BID   AKI due to hypotension versus contrast. Resolved Metabolic acidosis  due to above - given amp bicarb 8/15 Treated with IV fluids initially, mucomyst  x 2 days Monitor renal functions and urine output Check bladder scan Avoid nephrotoxic medications and use renally dose medications   Hyperkalemia secondary to AKI. Resolved  Lokelma  10 g x 2 doses given Monitor BMP daily     OSA -02 at bedtime if not using CPAP -pt now agreeable to try CPAP at bedtime - ordered -needs outpatient sleep study ASAP post-dc -recommend nocturnal O2 study before dc to see if he qualifies for at least nighttime home O2   DJD of the spine -supportive care    Hx of ETOH abuse -monitored on ciwa - now outside window for w/d sx's   Constipation: continue bowel regimen      Subjective: Pt sitting edge of bed having lunch, wife at bedside when seen today. He reports using CPAP overnight and slept very well.  Going to OR for wound vac change tomorrow.  He overall feels slowly getting better.  No other acute complaints.    Physical Exam: Vitals:   12/16/23 1000 12/16/23 1100 12/16/23 1200 12/16/23 1351  BP: (!) 145/72     Pulse: 67 (!) 40 73   Resp:      Temp:      TempSrc:      SpO2: 100% 96% 100% 95%  Weight:      Height:       General exam: awake, alert, no acute distress, chronically ill appearing HEENT: moist mucus membranes, hearing grossly normal  Respiratory system: lungs clear generally diminished, no wheezes or rhonchi, normal respiratory effort on room air. Cardiovascular system: normal S1/S2, RRR Gastrointestinal system: soft, NT, ND Central nervous system: A&O x4. no gross focal neurologic deficits, normal  speech Extremities: wound vac to right groin is well-sealed, no surrounding swelling, R BKA, no edema, normal tone Skin: dry, intact, normal temperature Psychiatry: normal mood, congruent affect, judgement and insight appear normal    Data Reviewed:  Notable labs --   Hbg 9.9 >> 10.6 >> 10.4>> 9.6 >> 10.0 >> 9.4 WBC 11.2  Last BMP - Na 134 Glucose 117 BUN 25 Ca 8.7     Family Communication: Wife at bedside on rounds this AM   Disposition: Status is: Inpatient Remains inpatient appropriate because: severity of illness as outlined above   Planned Discharge Destination: Home with Home Health    Time spent: 45 minutes  Author: Burnard DELENA Cunning, DO 12/16/2023 5:22 PM  For on call review [email or web address].

## 2023-12-17 ENCOUNTER — Inpatient Hospital Stay: Admitting: Anesthesiology

## 2023-12-17 ENCOUNTER — Encounter
Admission: EM | Disposition: A | Discharge status: Skilled Nursing Facility | Payer: Self-pay | Source: Home / Self Care | Attending: Internal Medicine

## 2023-12-17 ENCOUNTER — Other Ambulatory Visit: Payer: Self-pay

## 2023-12-17 DIAGNOSIS — D508 Other iron deficiency anemias: Secondary | ICD-10-CM

## 2023-12-17 DIAGNOSIS — T8189XA Other complications of procedures, not elsewhere classified, initial encounter: Secondary | ICD-10-CM

## 2023-12-17 DIAGNOSIS — R6520 Severe sepsis without septic shock: Secondary | ICD-10-CM

## 2023-12-17 DIAGNOSIS — R578 Other shock: Secondary | ICD-10-CM | POA: Diagnosis not present

## 2023-12-17 DIAGNOSIS — Z794 Long term (current) use of insulin: Secondary | ICD-10-CM

## 2023-12-17 DIAGNOSIS — F1011 Alcohol abuse, in remission: Secondary | ICD-10-CM | POA: Insufficient documentation

## 2023-12-17 DIAGNOSIS — A419 Sepsis, unspecified organism: Secondary | ICD-10-CM

## 2023-12-17 DIAGNOSIS — D509 Iron deficiency anemia, unspecified: Secondary | ICD-10-CM | POA: Insufficient documentation

## 2023-12-17 DIAGNOSIS — E875 Hyperkalemia: Secondary | ICD-10-CM | POA: Insufficient documentation

## 2023-12-17 DIAGNOSIS — I772 Rupture of artery: Secondary | ICD-10-CM | POA: Diagnosis not present

## 2023-12-17 DIAGNOSIS — B9689 Other specified bacterial agents as the cause of diseases classified elsewhere: Secondary | ICD-10-CM

## 2023-12-17 DIAGNOSIS — A498 Other bacterial infections of unspecified site: Secondary | ICD-10-CM | POA: Diagnosis not present

## 2023-12-17 DIAGNOSIS — G4730 Sleep apnea, unspecified: Secondary | ICD-10-CM | POA: Insufficient documentation

## 2023-12-17 DIAGNOSIS — N179 Acute kidney failure, unspecified: Secondary | ICD-10-CM

## 2023-12-17 HISTORY — PX: APPLICATION OF WOUND VAC: SHX5189

## 2023-12-17 LAB — CBC
HCT: 29.2 % — ABNORMAL LOW (ref 39.0–52.0)
Hemoglobin: 9.5 g/dL — ABNORMAL LOW (ref 13.0–17.0)
MCH: 29.7 pg (ref 26.0–34.0)
MCHC: 32.5 g/dL (ref 30.0–36.0)
MCV: 91.3 fL (ref 80.0–100.0)
Platelets: 225 K/uL (ref 150–400)
RBC: 3.2 MIL/uL — ABNORMAL LOW (ref 4.22–5.81)
RDW: 16.8 % — ABNORMAL HIGH (ref 11.5–15.5)
WBC: 9.2 K/uL (ref 4.0–10.5)
nRBC: 0 % (ref 0.0–0.2)

## 2023-12-17 LAB — BASIC METABOLIC PANEL WITH GFR
Anion gap: 5 (ref 5–15)
BUN: 21 mg/dL (ref 8–23)
CO2: 26 mmol/L (ref 22–32)
Calcium: 9 mg/dL (ref 8.9–10.3)
Chloride: 105 mmol/L (ref 98–111)
Creatinine, Ser: 0.75 mg/dL (ref 0.61–1.24)
GFR, Estimated: >60 mL/min (ref >60)
Glucose, Bld: 84 mg/dL (ref 70–99)
Potassium: 4.8 mmol/L (ref 3.5–5.1)
Sodium: 136 mmol/L (ref 135–145)

## 2023-12-17 LAB — GLUCOSE, CAPILLARY
Glucose-Capillary: 100 mg/dL — ABNORMAL HIGH (ref 70–99)
Glucose-Capillary: 118 mg/dL — ABNORMAL HIGH (ref 70–99)
Glucose-Capillary: 122 mg/dL — ABNORMAL HIGH (ref 70–99)
Glucose-Capillary: 127 mg/dL — ABNORMAL HIGH (ref 70–99)
Glucose-Capillary: 294 mg/dL — ABNORMAL HIGH (ref 70–99)
Glucose-Capillary: 69 mg/dL — ABNORMAL LOW (ref 70–99)
Glucose-Capillary: 70 mg/dL (ref 70–99)
Glucose-Capillary: 71 mg/dL (ref 70–99)
Glucose-Capillary: 77 mg/dL (ref 70–99)
Glucose-Capillary: 77 mg/dL (ref 70–99)

## 2023-12-17 SURGERY — APPLICATION, WOUND VAC
Anesthesia: Monitor Anesthesia Care | Site: Groin | Laterality: Right

## 2023-12-17 MED ORDER — SODIUM CHLORIDE 0.9 % IV SOLN: INTRAVENOUS | Status: DC | PRN

## 2023-12-17 MED ORDER — DEXTROSE 50 % IV SOLN
25.0000 g | Freq: Once | INTRAVENOUS | Status: AC
  Administered 2023-12-17: 25 g via INTRAVENOUS

## 2023-12-17 MED ORDER — PROPOFOL 1000 MG/100ML IV EMUL
INTRAVENOUS | Status: AC
  Filled 2023-12-17: qty 100

## 2023-12-17 MED ORDER — ONDANSETRON HCL 4 MG/2ML IJ SOLN
INTRAMUSCULAR | Status: AC
  Filled 2023-12-17: qty 2

## 2023-12-17 MED ORDER — DEXAMETHASONE SODIUM PHOSPHATE 10 MG/ML IJ SOLN
INTRAMUSCULAR | Status: DC | PRN
  Administered 2023-12-17: 10 mg via INTRAVENOUS

## 2023-12-17 MED ORDER — INSULIN GLARGINE-YFGN 100 UNIT/ML ~~LOC~~ SOLN: 9.0000 [IU] | Freq: Every day | SUBCUTANEOUS | Status: DC

## 2023-12-17 MED ORDER — DEXAMETHASONE SODIUM PHOSPHATE 10 MG/ML IJ SOLN
INTRAMUSCULAR | Status: AC
Start: 2023-12-17 — End: 2023-12-17
  Filled 2023-12-17: qty 1

## 2023-12-17 MED ORDER — DEXTROSE 50 % IV SOLN
25.0000 mL | Freq: Once | INTRAVENOUS | Status: AC
  Administered 2023-12-17: 25 mL via INTRAVENOUS

## 2023-12-17 MED ORDER — FENTANYL CITRATE (PF) 100 MCG/2ML IJ SOLN
INTRAMUSCULAR | Status: DC | PRN
  Administered 2023-12-17: 25 ug via INTRAVENOUS

## 2023-12-17 MED ORDER — ACETAMINOPHEN 10 MG/ML IV SOLN
1000.0000 mg | Freq: Once | INTRAVENOUS | Status: DC | PRN
  Administered 2023-12-17: 1000 mg via INTRAVENOUS

## 2023-12-17 MED ORDER — VASHE WOUND IRRIGATION OPTIME
TOPICAL | Status: DC | PRN
  Administered 2023-12-17: 34 [oz_av]

## 2023-12-17 MED ORDER — ACETAMINOPHEN 10 MG/ML IV SOLN
INTRAVENOUS | Status: AC
  Filled 2023-12-17: qty 100

## 2023-12-17 MED ORDER — PROPOFOL 500 MG/50ML IV EMUL
INTRAVENOUS | Status: DC | PRN
  Administered 2023-12-17: 20 mg via INTRAVENOUS
  Administered 2023-12-17: 50 ug/kg/min via INTRAVENOUS

## 2023-12-17 MED ORDER — LACTATED RINGERS IV SOLN: INTRAVENOUS | Status: DC

## 2023-12-17 MED ORDER — FENTANYL CITRATE (PF) 100 MCG/2ML IJ SOLN
INTRAMUSCULAR | Status: AC
  Filled 2023-12-17: qty 2

## 2023-12-17 MED ORDER — ONDANSETRON HCL 4 MG/2ML IJ SOLN
INTRAMUSCULAR | Status: DC | PRN
  Administered 2023-12-17: 4 mg via INTRAVENOUS

## 2023-12-17 MED ORDER — OXYCODONE HCL 5 MG PO TABS
5.0000 mg | ORAL_TABLET | Freq: Once | ORAL | Status: AC | PRN
  Administered 2023-12-17: 5 mg via ORAL

## 2023-12-17 MED ORDER — INSULIN GLARGINE 100 UNIT/ML ~~LOC~~ SOLN
9.0000 [IU] | Freq: Every day | SUBCUTANEOUS | Status: DC
  Administered 2023-12-17: 9 [IU] via SUBCUTANEOUS
  Filled 2023-12-17 (×2): qty 0.09

## 2023-12-17 MED ORDER — OXYCODONE HCL 5 MG/5ML PO SOLN: 5.0000 mg | Freq: Once | ORAL | Status: AC | PRN

## 2023-12-17 MED ORDER — OXYCODONE HCL 5 MG PO TABS
ORAL_TABLET | ORAL | Status: AC
  Filled 2023-12-17: qty 1

## 2023-12-17 MED ORDER — DEXTROSE 50 % IV SOLN
INTRAVENOUS | Status: AC
  Filled 2023-12-17: qty 50

## 2023-12-17 MED ORDER — 0.9 % SODIUM CHLORIDE (POUR BTL) OPTIME
TOPICAL | Status: DC | PRN
  Administered 2023-12-17: 500 mL

## 2023-12-17 SURGICAL SUPPLY — 20 items
CANISTER WOUND CARE 500ML ATS (WOUND CARE) ×1 IMPLANT
CLEANSER WND VASHE 34 (WOUND CARE) IMPLANT
DRAPE INCISE IOBAN 66X45 STRL (DRAPES) ×1 IMPLANT
DRAPE LAPAROTOMY 100X77 ABD (DRAPES) ×1 IMPLANT
DRSG VAC GRANUFOAM LG (GAUZE/BANDAGES/DRESSINGS) ×1 IMPLANT
DRSG VAC GRANUFOAM MED (GAUZE/BANDAGES/DRESSINGS) ×1 IMPLANT
ELECTRODE REM PT RTRN 9FT ADLT (ELECTROSURGICAL) ×1 IMPLANT
GAUZE 4X4 16PLY ~~LOC~~+RFID DBL (SPONGE) ×1 IMPLANT
GAUZE SPONGE 4X4 12PLY STRL (GAUZE/BANDAGES/DRESSINGS) IMPLANT
GLOVE BIO SURGEON STRL SZ7 (GLOVE) ×2 IMPLANT
GOWN STRL REUS W/ TWL LRG LVL3 (GOWN DISPOSABLE) ×2 IMPLANT
GOWN STRL REUS W/TWL 2XL LVL3 (GOWN DISPOSABLE) ×1 IMPLANT
KIT TURNOVER KIT A (KITS) ×1 IMPLANT
MANIFOLD NEPTUNE II (INSTRUMENTS) ×1 IMPLANT
NS IRRIG 1000ML POUR BTL (IV SOLUTION) ×1 IMPLANT
PACK BASIN MINOR ARMC (MISCELLANEOUS) ×1 IMPLANT
SOLUTION PREP PVP 2OZ (MISCELLANEOUS) ×1 IMPLANT
SPONGE T-LAP 18X18 ~~LOC~~+RFID (SPONGE) ×1 IMPLANT
TRAP FLUID SMOKE EVACUATOR (MISCELLANEOUS) ×1 IMPLANT
WATER STERILE IRR 500ML POUR (IV SOLUTION) ×1 IMPLANT

## 2023-12-17 NOTE — Assessment & Plan Note (Addendum)
 Will need outpatient sleep study.  With nocturnal hypoxia with overnight oximetry he qualifies for nocturnal oxygen.

## 2023-12-17 NOTE — Anesthesia Preprocedure Evaluation (Signed)
 Anesthesia Evaluation  Patient identified by MRN, date of birth, ID band Patient awake    Reviewed: Allergy & Precautions, H&P , NPO status , Patient's Chart, lab work & pertinent test results, reviewed documented beta blocker date and time   Airway Mallampati: II  TM Distance: >3 FB Neck ROM: full    Dental no notable dental hx. (+) Teeth Intact   Pulmonary sleep apnea    Pulmonary exam normal breath sounds clear to auscultation       Cardiovascular Exercise Tolerance: Poor hypertension, On Medications + CAD, + Past MI, + Peripheral Vascular Disease and +CHF   Rhythm:regular Rate:Normal     Neuro/Psych  PSYCHIATRIC DISORDERS  Depression     Neuromuscular disease    GI/Hepatic negative GI ROS, Neg liver ROS,,,  Endo/Other  negative endocrine ROSdiabetes    Renal/GU Renal disease     Musculoskeletal   Abdominal   Peds  Hematology  (+) Blood dyscrasia, anemia   Anesthesia Other Findings   Reproductive/Obstetrics negative OB ROS                              Anesthesia Physical Anesthesia Plan  ASA: 3 and emergent  Anesthesia Plan: MAC   Post-op Pain Management:    Induction:   PONV Risk Score and Plan:   Airway Management Planned:   Additional Equipment:   Intra-op Plan:   Post-operative Plan:   Informed Consent: I have reviewed the patients History and Physical, chart, labs and discussed the procedure including the risks, benefits and alternatives for the proposed anesthesia with the patient or authorized representative who has indicated his/her understanding and acceptance.       Plan Discussed with: CRNA  Anesthesia Plan Comments:         Anesthesia Quick Evaluation

## 2023-12-17 NOTE — Assessment & Plan Note (Addendum)
 With metabolic acidosis.  Resolved.

## 2023-12-17 NOTE — Assessment & Plan Note (Addendum)
 Decreased Lantus  5 units at bedtime, may be able to go back up to 8 or 10 units upon discharge.  Continue sliding scale.  Patient with mild hyperglycemia and hypoglycemia.

## 2023-12-17 NOTE — Op Note (Signed)
    OPERATIVE NOTE   PROCEDURE: Irrigation and debridement of skin, soft tissue, and muscle of the right groin wound.  Placement of negative pressure dressing right groin.  PRE-OPERATIVE DIAGNOSIS: Nonviable tissue and infection of right groin status post sartorius flap with open wound  POST-OPERATIVE DIAGNOSIS: Same as above  SURGEON: Selinda Gu, MD  ASSISTANT(S): None  ANESTHESIA: MAC  ESTIMATED BLOOD LOSS: 5 cc  FINDING(S): None  SPECIMEN(S): None  INDICATIONS:   Manuel [name] is a 78 y.o. male who presents with an open right groin wound after previous femoral artery reconstruction for infection recently.  He had a sartorius flap placed as well.  He had a hematoma and arterial repair at the anastomosis following the initial reconstruction and is brought back to the operating room for his first OR VAC change and wound debridement after the surgery.  Risks and benefits were discussed and informed consent was obtained..  DESCRIPTION: After obtaining full informed written consent, the patient was brought back to the operating room and placed supine upon the operating table.  The patient received IV antibiotics prior to induction.  After obtaining adequate anesthesia, the patient was prepped and draped in the standard fashion.  The wound was then opened and excisional debridement was performed to some nonviable adipose tissue particularly at the superior portion of the wound and a small amount of fibrinous exudate and nonviable tissue from the sartorius flap.  This is mostly on the superior and lateral aspect of the flap.  Debridement was done to remove all clearly non-viable tissue.  The tissue was taken back to bleeding tissue that appeared viable.  The debridement was performed with Metzenbaum scissors and encompassed an area of approximately 30-40 cm2.  The wound was irrigated copiously with Vashe irrigation.  After all clearly non-viable tissue was removed, the sartorius flap was  viable and well adhered to the superior and medial portion where it had been secured with sutures.  There was some dead space laterally from where the sartorius flap had been harvested from, but there was only serous drainage in this location.  There were still remaining antibiotic beads from the previous surgery.  The wound measured 18 cm long, 8 cm wide, and about 3 cm deep.  A large VAC sponge was then cut to fit the wound.  Strips of Ioban were used and good occlusive seal was obtained once connected to suction. The patient was then awakened from anesthesia and taken to the recovery room in stable condition having tolerated the procedure well.  COMPLICATIONS: none  CONDITION: stable  Selinda Gu  12/17/2023, 2:42 PM   This note was created with Dragon Medical transcription system. Any errors in dictation are purely unintentional.

## 2023-12-17 NOTE — Assessment & Plan Note (Signed)
 Secondary to hemorrhagic shock.  Patient on aspirin , Plavix , Coreg , losartan  and statin.

## 2023-12-17 NOTE — Transfer of Care (Signed)
 Immediate Anesthesia Transfer of Care Note  Patient: Manuel [name]  Procedure(s) Performed: APPLICATION, WOUND VAC (Right: Groin)  Patient Location: PACU  Anesthesia Type:MAC  Level of Consciousness: drowsy, patient cooperative, and responds to stimulation  Airway & Oxygen Therapy: Patient Spontanous Breathing and Patient connected to face mask oxygen  Post-op Assessment: Report given to RN, Post -op Vital signs reviewed and stable, and Patient moving all extremities  Post vital signs: Reviewed and stable  Last Vitals:  Vitals Value Taken Time  BP 107/62 12/17/23 14:47  Temp 36.8 C 12/17/23 14:45  Pulse 49 12/17/23 14:48  Resp 7 12/17/23 14:48  SpO2 100 % 12/17/23 14:48  Vitals shown include unfiled device data.  Last Pain:  Vitals:   12/17/23 1447  TempSrc:   PainSc: 0-No pain      Patients Stated Pain Goal: 2 (12/06/23 1230)  Complications: No notable events documented.

## 2023-12-17 NOTE — Assessment & Plan Note (Addendum)
 Last hemoglobin 9.3.  Received 3 units of blood during the hospital course.

## 2023-12-17 NOTE — Anesthesia Postprocedure Evaluation (Signed)
 Anesthesia Post Note  Patient: Manuel [name]  Procedure(s) Performed: APPLICATION, WOUND VAC (Right: Groin)  Patient location during evaluation: Phase II Anesthesia Type: MAC Level of consciousness: awake and alert Pain management: pain level controlled Vital Signs Assessment: post-procedure vital signs reviewed and stable Respiratory status: spontaneous breathing, nonlabored ventilation, respiratory function stable and patient connected to nasal cannula oxygen Cardiovascular status: blood pressure returned to baseline and stable Postop Assessment: no apparent nausea or vomiting Anesthetic complications: no   No notable events documented.   Last Vitals:  Vitals:   12/17/23 1600 12/17/23 1700  BP: (!) 149/79 136/70  Pulse: (!) 51 (!) 53  Resp: 10 (!) 9  Temp:    SpO2: 100% 100%    Last Pain:  Vitals:   12/17/23 1530  TempSrc: Oral  PainSc:                  Lynwood KANDICE Clause

## 2023-12-17 NOTE — Interval H&P Note (Signed)
 History and Physical Interval Note:  12/17/2023 1:28 PM  Manuel [name]  has presented today for surgery, with the diagnosis of right femoral surgical wound.  The various methods of treatment have been discussed with the patient and family. After consideration of risks, benefits and other options for treatment, the patient has consented to  Procedure(s) with comments: APPLICATION, WOUND VAC (Right) - EXCHANGE as a surgical intervention.  The patient's history has been reviewed, patient examined, no change in status, stable for surgery.  I have reviewed the patient's chart and labs.  Questions were answered to the patient's satisfaction.     Auna Mikkelsen

## 2023-12-17 NOTE — Progress Notes (Signed)
 Patient returned from OR

## 2023-12-17 NOTE — Anesthesia Procedure Notes (Addendum)
 Procedure Name: MAC Date/Time: 12/17/2023 1:52 PM  Performed by: Lorrene Camelia LABOR, CRNAPre-anesthesia Checklist: Patient identified, Emergency Drugs available, Suction available and Patient being monitored Patient Re-evaluated:Patient Re-evaluated prior to induction Oxygen Delivery Method: Simple face mask Preoxygenation: Pre-oxygenation with 100% oxygen Induction Type: IV induction

## 2023-12-17 NOTE — Progress Notes (Incomplete)
Patient to OR with OR transport.

## 2023-12-17 NOTE — Plan of Care (Signed)
  Problem: Education: Goal: Knowledge of General Education information will improve Description: Including pain rating scale, medication(s)/side effects and non-pharmacologic comfort measures Outcome: Progressing   Problem: Clinical Measurements: Goal: Cardiovascular complication will be avoided Outcome: Progressing   Problem: Elimination: Goal: Will not experience complications related to bowel motility Outcome: Progressing Goal: Will not experience complications related to urinary retention Outcome: Progressing   Problem: Clinical Measurements: Goal: Will remain free from infection Outcome: Not Progressing   Problem: Pain Managment: Goal: General experience of comfort will improve and/or be controlled Outcome: Not Progressing

## 2023-12-17 NOTE — Assessment & Plan Note (Signed)
 Continue aspirin  and Plavix , history of right BKA.

## 2023-12-17 NOTE — Assessment & Plan Note (Signed)
 Resolved

## 2023-12-17 NOTE — Assessment & Plan Note (Addendum)
 8/14.  Patient had percutaneous transluminal angioplasty and stent placement of the right profunda femoris and common femoral artery extending into the distal right external iliac artery. 8/21.  Right external iliac artery to profunda femoris artery bypass with autologous ipsilateral superficial femoral artery, resection of infected right, femoral artery and removal of common femoral artery, profunda femoris artery and superficial femoral artery stents.  Harvest of right superficial femoral artery for bypass with endarterectomy of the right superficial femoral artery. 8/27.  Wound VAC changed in the operating room 9/2.  Wound VAC changed in the operating room.  Dr. [name] gave clearance to follow-up as outpatient and have nursing staff change wound VAC moving forward.

## 2023-12-17 NOTE — Assessment & Plan Note (Addendum)
Last sodium 134

## 2023-12-17 NOTE — Assessment & Plan Note (Addendum)
 Last EF 20%, continue low-dose Coreg , low-dose losartan  and will increase dose of Aldactone  to 25 mg daily

## 2023-12-17 NOTE — Assessment & Plan Note (Addendum)
 Secondary to Serratia.  Present on admission.  Currently on Maxipime  as per ID.  Will need total of 4 weeks of IV antibiotics with either Maxipime  or ertapenem .  Patient and wife want to do IV antibiotics at home rather than a subacute rehab.  Would be open to acute inpatient rehab though.

## 2023-12-17 NOTE — Progress Notes (Signed)
 Progress Note   Patient: Manuel [name] FMW:[id number] DOB: 01-08-1946 DOA: 12/04/2023     13 DOS: the patient was seen and examined on 12/17/2023   Brief hospital course: 78 y.o. 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 Patient fell with walker landing on top of him.  EMS was called for assistance.  Patient refused to be transported to ED for evaluation initial. However as time when on patient note more intense pain in right groin area and due to his EMS was called back and patient was transported to ED.      ED Course:  99.3/tmx 100.7 BP 131/72, hr 113, rr 19 sat 95%  UA :rare bacteria , ketones 20 ,  Wbc 11.6, hgb 14.3, plt 191, increase pmn Na 126 ( 137), K 5.3, cl91, glu 438, cr 1.12 Lactic 2.9 ,3.3 Trop 284,3403, 8119 EKG: sinus tachycardia , RAD twave change inferior lateral leads   Patient on evaluation found to have right femoral a rupture, vascular consulted and performed repair.     Patient course was further complicated by NTSEMI , s/p right femoral a repair patient was started on heparin  drip . Cardiology was consulted and will see patient in am , but [no] plans for intervention  recommend medical management.    PCCM was involved for comanagement. TRH was consulted for admission and further management as below  8/27.  Patient to go to the operating room for first wound VAC change.  Assessment and Plan: * Rupture of femoral artery (HCC) 8/14.  Patient had percutaneous transluminal angioplasty and stent placement of the right profunda femoris and common femoral artery extending into the distal right external iliac artery. 8/21.  Right external iliac artery  to profunda femoris artery bypass with autologous ipsilateral superficial femoral artery, resection of infected right, femoral artery and removal of common femoral artery, profunda femoris artery and superficial femoral artery stents.  Harvest of right superficial femoral artery for bypass with endarterectomy of the right superficial femoral artery. 8/27 will have wound VAC changed today in operating room.  Severe sepsis (HCC) Secondary to Serratia.  Present on admission.  Currently on Maxipime  as per ID.  May need extended IV antibiotics.  Hemorrhagic shock (HCC) Resolved.  Chronic systolic CHF (congestive heart failure) (HCC) Last EF 20%, continue Coreg   Uncontrolled type 2 diabetes mellitus with hyperglycemia, with long-term current use of insulin  (HCC) With episode of hypoglycemia will decrease Semglee  insulin  down to 9 units nightly and continue sliding scale.  NSTEMI (non-ST elevated myocardial infarction) (HCC) Secondary to hemorrhagic shock.  Patient on aspirin , Plavix , Coreg  and statin  Hyponatremia Last sodium normal range  PAD (peripheral artery disease) (HCC) Continue aspirin  and Plavix , history of right BKA.  AKI (acute kidney injury) (HCC) With metabolic acidosis.  Resolved.  Hyperkalemia Resolved  Sleep apnea Will need outpatient sleep study.  History of alcohol  abuse On thiamine   Iron  deficiency anemia Last hemoglobin 9.5.  Received 3 units of blood during the hospital course.        Subjective: Patient feeling okay.  Offers no complaints.  Seen prior to wound VAC change.  Admitted with leaking femoral artery.  Physical Exam: Vitals:   12/17/23 1000 12/17/23 1100 12/17/23 1200 12/17/23 1320  BP:   130/65 (!) 141/65  Pulse: 66 (!) 57 90 (!) 58  Resp: 13 (!) 28 18 16   Temp:    (!) 96.4 F (35.8 C)  TempSrc:    Temporal  SpO2: 100% 100% (!) 79% 99%  Weight:      Height:       Physical Exam HENT:     Head: Normocephalic.     Mouth/Throat:      Pharynx: No oropharyngeal exudate.  Eyes:     General: Lids are normal.     Conjunctiva/sclera: Conjunctivae normal.  Cardiovascular:     Rate and Rhythm: Normal rate and regular rhythm.     Heart sounds: Normal heart sounds, S1 normal and S2 normal.  Pulmonary:     Breath sounds: No decreased breath sounds, wheezing, rhonchi or rales.  Abdominal:     Palpations: Abdomen is soft.     Tenderness: There is no abdominal tenderness.  Musculoskeletal:     Right Lower Extremity: Right leg is amputated below knee.  Skin:    General: Skin is warm.     Comments: Wound VAC right groin.  Neurological:     Mental Status: He is alert and oriented to person, place, and time.     Data Reviewed: Creatinine 0.75, sodium 136, hemoglobin 9.5, platelet count 225  Family Communication: Wife at bedside  Disposition: Status is: Inpatient Remains inpatient appropriate because: Patient for wound VAC change today  Planned Discharge Destination: To be determined    Time spent: 28 minutes  Author: Charlie Patterson, MD 12/17/2023 2:24 PM  For on call review [email or web address].

## 2023-12-17 NOTE — Assessment & Plan Note (Signed)
On thiamine 

## 2023-12-17 NOTE — Inpatient Diabetes Management (Signed)
 Inpatient Diabetes Program Recommendations  AACE/ADA: New Consensus Statement on Inpatient Glycemic Control  Target Ranges:  Prepandial:   less than 140 mg/dL      Peak postprandial:   less than 180 mg/dL (1-2 hours)      Critically ill patients:  140 - 180 mg/dL    Latest Reference Range & Units 12/17/23 00:55 12/17/23 04:24 12/17/23 07:34  Glucose-Capillary 70 - 99 mg/dL 877 (H) 71 69 (L)    Latest Reference Range & Units 12/16/23 07:15 12/16/23 11:36 12/16/23 16:24 12/16/23 19:38  Glucose-Capillary 70 - 99 mg/dL 72 87 838 (H) 879 (H)   Review of Glycemic Control  Diabetes history: DM2 Outpatient Diabetes medications: Farxiga  10 mg daily, 70/30 30 units BID Current orders for Inpatient glycemic control: Semglee  14 units at bedtime, Novolog  0-15 units Q4H  Inpatient Diabetes Program Recommendations:    Insulin : CBG 69 mg/dl this morning. Please decrease Semglee  to 9 units Q24H.  Thanks, Earnie Gainer, RN, MSN, CDCES Diabetes Coordinator Inpatient Diabetes Program [phone] (Team Pager from 8am to 5pm)

## 2023-12-17 NOTE — Hospital Course (Addendum)
 78 y.o. 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 Patient fell with walker landing on top of him.  EMS was called for assistance.  Patient refused to be transported to ED for evaluation initial. However as time when on patient note more intense pain in right groin area and due to his EMS was called back and patient was transported to ED.      ED Course:  99.3/tmx 100.7 BP 131/72, hr 113, rr 19 sat 95%  UA :rare bacteria , ketones 20 ,  Wbc 11.6, hgb 14.3, plt 191, increase pmn Na 126 ( 137), K 5.3, cl91, glu 438, cr 1.12 Lactic 2.9 ,3.3 Trop 284,3403, 8119 EKG: sinus tachycardia , RAD twave change inferior lateral leads   Patient on evaluation found to have right femoral a rupture, vascular consulted and performed repair.     Patient course was further complicated by NTSEMI , s/p right femoral a repair patient was started on heparin  drip . Cardiology was consulted recommend medical management.    PCCM was involved for comanagement. TRH was consulted for admission and further management as below  8/27.  Patient to go to the operating room for first wound VAC change. 8/28.  Patient can transfer out of the ICU.  Vascular team plans on taking back to the OR on Tuesday for wound VAC change. 8/29.  Patient and wife interested in going home after hospitalization.  Will need IV antibiotics upon discharge.  Nonsustained ventricular tachycardia 8 beats.  Patient given IV magnesium . 8/30.  Patient qualifies for nocturnal oxygen. 8/31.  Patient wants to go home rather than acute inpatient rehab. 9/1.  Will restart MiraLAX .  Hemoglobin 9.3.  PICC line placed. 9/2.  Patient told me this  morning that he wanted to go to acute inpatient rehab.  Dr. [name] changed the wound VAC in the operating room and he is okay with nursing staff changing moving forward.

## 2023-12-17 NOTE — Plan of Care (Signed)
 Problem: Education: Goal: Knowledge of General Education information will improve Description: Including pain rating scale, medication(s)/side effects and non-pharmacologic comfort measures Outcome: Progressing   Problem: Health Behavior/Discharge Planning: Goal: Ability to manage health-related needs will improve Outcome: Progressing   Problem: Clinical Measurements: Goal: Ability to maintain clinical measurements within normal limits will improve Outcome: Progressing Goal: Will remain free from infection Outcome: Progressing Goal: Diagnostic test results will improve Outcome: Progressing Goal: Respiratory complications will improve Outcome: Progressing Goal: Cardiovascular complication will be avoided Outcome: Progressing   Problem: Activity: Goal: Risk for activity intolerance will decrease Outcome: Progressing   Problem: Nutrition: Goal: Adequate nutrition will be maintained Outcome: Progressing   Problem: Coping: Goal: Level of anxiety will decrease Outcome: Progressing   Problem: Elimination: Goal: Will not experience complications related to bowel motility Outcome: Progressing Goal: Will not experience complications related to urinary retention Outcome: Progressing   Problem: Pain Managment: Goal: General experience of comfort will improve and/or be controlled Outcome: Progressing   Problem: Safety: Goal: Ability to remain free from injury will improve Outcome: Progressing   Problem: Skin Integrity: Goal: Risk for impaired skin integrity will decrease Outcome: Progressing   Problem: Education: Goal: Ability to describe self-care measures that may prevent or decrease complications (Diabetes Survival Skills Education) will improve Outcome: Progressing Goal: Individualized Educational Video(s) Outcome: Progressing   Problem: Coping: Goal: Ability to adjust to condition or change in health will improve Outcome: Progressing   Problem: Fluid  Volume: Goal: Ability to maintain a balanced intake and output will improve Outcome: Progressing   Problem: Health Behavior/Discharge Planning: Goal: Ability to identify and utilize available resources and services will improve Outcome: Progressing Goal: Ability to manage health-related needs will improve Outcome: Progressing   Problem: Metabolic: Goal: Ability to maintain appropriate glucose levels will improve Outcome: Progressing   Problem: Nutritional: Goal: Maintenance of adequate nutrition will improve Outcome: Progressing Goal: Progress toward achieving an optimal weight will improve Outcome: Progressing   Problem: Skin Integrity: Goal: Risk for impaired skin integrity will decrease Outcome: Progressing   Problem: Tissue Perfusion: Goal: Adequacy of tissue perfusion will improve Outcome: Progressing   Problem: Education: Goal: Understanding of CV disease, CV risk reduction, and recovery process will improve Outcome: Progressing Goal: Individualized Educational Video(s) Outcome: Progressing   Problem: Activity: Goal: Ability to return to baseline activity level will improve Outcome: Progressing   Problem: Cardiovascular: Goal: Ability to achieve and maintain adequate cardiovascular perfusion will improve Outcome: Progressing Goal: Vascular access site(s) Level 0-1 will be maintained Outcome: Progressing   Problem: Health Behavior/Discharge Planning: Goal: Ability to safely manage health-related needs after discharge will improve Outcome: Progressing   Problem: Education: Goal: Ability to describe self-care measures that may prevent or decrease complications (Diabetes Survival Skills Education) will improve Outcome: Progressing Goal: Individualized Educational Video(s) Outcome: Progressing   Problem: Cardiac: Goal: Ability to maintain an adequate cardiac output will improve Outcome: Progressing   Problem: Health Behavior/Discharge Planning: Goal:  Ability to identify and utilize available resources and services will improve Outcome: Progressing Goal: Ability to manage health-related needs will improve Outcome: Progressing   Problem: Fluid Volume: Goal: Ability to achieve a balanced intake and output will improve Outcome: Progressing   Problem: Metabolic: Goal: Ability to maintain appropriate glucose levels will improve Outcome: Progressing   Problem: Nutritional: Goal: Maintenance of adequate nutrition will improve Outcome: Progressing Goal: Maintenance of adequate weight for body size and type will improve Outcome: Progressing   Problem: Respiratory: Goal: Will regain and/or maintain adequate ventilation Outcome:  Progressing   Problem: Urinary Elimination: Goal: Ability to achieve and maintain adequate renal perfusion and functioning will improve Outcome: Progressing

## 2023-12-18 ENCOUNTER — Encounter: Payer: Self-pay | Admitting: Vascular Surgery

## 2023-12-18 DIAGNOSIS — R578 Other shock: Secondary | ICD-10-CM | POA: Diagnosis not present

## 2023-12-18 DIAGNOSIS — A419 Sepsis, unspecified organism: Secondary | ICD-10-CM | POA: Diagnosis not present

## 2023-12-18 DIAGNOSIS — R7881 Bacteremia: Secondary | ICD-10-CM

## 2023-12-18 DIAGNOSIS — I5022 Chronic systolic (congestive) heart failure: Secondary | ICD-10-CM | POA: Diagnosis not present

## 2023-12-18 DIAGNOSIS — T827XXS Infection and inflammatory reaction due to other cardiac and vascular devices, implants and grafts, sequela: Secondary | ICD-10-CM

## 2023-12-18 DIAGNOSIS — I772 Rupture of artery: Secondary | ICD-10-CM | POA: Diagnosis not present

## 2023-12-18 LAB — GLUCOSE, CAPILLARY
Glucose-Capillary: 233 mg/dL — ABNORMAL HIGH (ref 70–99)
Glucose-Capillary: 241 mg/dL — ABNORMAL HIGH (ref 70–99)
Glucose-Capillary: 225 mg/dL — ABNORMAL HIGH (ref 70–99)
Glucose-Capillary: 158 mg/dL — ABNORMAL HIGH (ref 70–99)
Glucose-Capillary: 128 mg/dL — ABNORMAL HIGH (ref 70–99)

## 2023-12-18 LAB — CBC
HCT: 31.5 % — ABNORMAL LOW (ref 39.0–52.0)
Hemoglobin: 10.2 g/dL — ABNORMAL LOW (ref 13.0–17.0)
MCH: 29.3 pg (ref 26.0–34.0)
MCHC: 32.4 g/dL (ref 30.0–36.0)
MCV: 90.5 fL (ref 80.0–100.0)
Platelets: 254 K/uL (ref 150–400)
RBC: 3.48 MIL/uL — ABNORMAL LOW (ref 4.22–5.81)
RDW: 16.7 % — ABNORMAL HIGH (ref 11.5–15.5)
WBC: 10.3 K/uL (ref 4.0–10.5)
nRBC: 0 % (ref 0.0–0.2)

## 2023-12-18 MED ORDER — LOSARTAN POTASSIUM 25 MG PO TABS
25.0000 mg | ORAL_TABLET | Freq: Every day | ORAL | Status: DC
  Administered 2023-12-18 – 2023-12-30 (×13): 25 mg via ORAL
  Filled 2023-12-18 (×13): qty 1

## 2023-12-18 MED ORDER — INSULIN GLARGINE 100 UNIT/ML ~~LOC~~ SOLN
12.0000 [IU] | Freq: Every day | SUBCUTANEOUS | Status: DC
  Administered 2023-12-18 – 2023-12-19 (×2): 12 [IU] via SUBCUTANEOUS
  Filled 2023-12-18 (×4): qty 0.12

## 2023-12-18 NOTE — Progress Notes (Signed)
 Physical Therapy Treatment Patient Details Name: Manuel [name] MRN: [id number] DOB: 1946-04-13 Today's Date: 12/18/2023   History of Present Illness Patient is a 78 year old male presenting after fall while getting out of bed to wheelchair.  Found to have right femoral a rupture, vascular consulted and performed repair. Complicated by Hemorrhagic shock requiring ICU and pressors. PMH: CHF, CAD, PAD, R BKA, HTN, gout    PT Comments  Pt A&Ox4, agreeable to PT/OT co-treatment. Pt able to initiate supine > sit transfer with increased time and effort, ultimately required minAx2 BUE support to pull to sitting EOB. Pt maintained static sitting at EOB with varying levels of UE support for approx 8 minutes. Pt able to laterally scoot himself to his L side toward [facility] with CGA and L foot blocked. Pt is continuing to progress toward his goals and remains highly motivated to improve his current condition. The patient would benefit from further skilled PT intervention to continue to improve his overall functional mobility status.      If plan is discharge home, recommend the following: A little help with walking and/or transfers;A little help with bathing/dressing/bathroom;Assist for transportation;Help with stairs or ramp for entrance;Assistance with cooking/housework   Can travel by private vehicle        Equipment Recommendations  None recommended by PT    Recommendations for Other Services       Precautions / Restrictions Precautions Precautions: Fall Recall of Precautions/Restrictions: Intact Precaution/Restrictions Comments: wound vac right groin Restrictions Weight Bearing Restrictions Per Provider Order: No     Mobility  Bed Mobility Overal bed mobility: Needs Assistance Bed Mobility: Supine to Sit, Sit to Supine     Supine to sit: Min assist, +2 for physical assistance, HOB elevated, Used rails Sit to supine: Contact guard assist, Used rails   General bed mobility comments: pt  able to initiate supine > sit with inc time/effort, ultimately required minAx2 BUE assist to pull to sitting EOB. Sit > supine with CGA    Transfers Overall transfer level: Needs assistance                Lateral/Scoot Transfers: Contact guard assist General transfer comment: Pt able to scoot himself up toward [facility] to his L with CGA, blocking of L foot to prevent sliding forward    Ambulation/Gait                   Stairs             Wheelchair Mobility     Tilt Bed    Modified Rankin (Stroke Patients Only)       Balance Overall balance assessment: Needs assistance Sitting-balance support: Single extremity supported, Bilateral upper extremity supported, No upper extremity supported, Feet supported Sitting balance-Leahy Scale: Good Sitting balance - Comments: pt able to maintain static sitting balance at EOB for approx with varying levels of UE support                                    Communication Communication Communication: Impaired Factors Affecting Communication: Hearing impaired  Cognition Arousal: Alert Behavior During Therapy: WFL for tasks assessed/performed   PT - Cognitive impairments: No apparent impairments                         Following commands: Impaired Following commands impaired: Follows one step commands with increased time  Cueing Cueing Techniques: Verbal cues, Visual cues  Exercises      General Comments General comments (skin integrity, edema, etc.): vss throughout, wound vac intact pre/post session      Pertinent Vitals/Pain Pain Assessment Pain Assessment: Faces Faces Pain Scale: Hurts little more Pain Location: R groin, with movement Pain Descriptors / Indicators: Grimacing, Moaning, Discomfort Pain Intervention(s): Limited activity within patient's tolerance, Monitored during session, Repositioned    Home Living                          Prior Function             PT Goals (current goals can now be found in the care plan section) Progress towards PT goals: Progressing toward goals    Frequency    Min 2X/week      PT Plan      Co-evaluation PT/OT/SLP Co-Evaluation/Treatment: Yes Reason for Co-Treatment: Complexity of the patient's impairments (multi-system involvement);For patient/therapist safety PT goals addressed during session: Mobility/safety with mobility OT goals addressed during session: ADL's and self-care      AM-PAC PT 6 Clicks Mobility   Outcome Measure  Help needed turning from your back to your side while in a flat bed without using bedrails?: A Little Help needed moving from lying on your back to sitting on the side of a flat bed without using bedrails?: A Little Help needed moving to and from a bed to a chair (including a wheelchair)?: A Lot Help needed standing up from a chair using your arms (e.g., wheelchair or bedside chair)?: A Lot Help needed to walk in hospital room?: Total Help needed climbing 3-5 steps with a railing? : Total 6 Click Score: 12    End of Session Equipment Utilized During Treatment: Oxygen Activity Tolerance: Patient tolerated treatment well Patient left: in bed;with call bell/phone within reach Nurse Communication: Mobility status PT Visit Diagnosis: Difficulty in walking, not elsewhere classified (R26.2);Muscle weakness (generalized) (M62.81)     Time: 8944-8876 PT Time Calculation (min) (ACUTE ONLY): 28 min  Charges:    $Therapeutic Activity: 8-22 mins PT General Charges $$ ACUTE PT VISIT: 1 Visit                     Manuel [name], SPT

## 2023-12-18 NOTE — Progress Notes (Signed)
 Progress Note    12/18/2023 10:02 AM 1 Day Post-Op  Subjective:  Manuel [name] is an 78 yo male now POD #6 From:   PROCEDURE: 1.   Right groin reexploration for bleeding 2.   Evacuation of right groin hematoma 3.   Suture repair of proximal bypass anastomosis in the distal external iliac artery 4.   Replacement of the sartorius flap for coverage 5.   Placement of gentamicin  and vancomycin  antibiotic impregnated beads 6.   Negative pressure dressing placement right groin  Patient has now POD #1   PROCEDURE: Irrigation and debridement of skin, soft tissue, and muscle of the right groin wound.  Placement of negative pressure dressing right groin.   Patient is resting comfortably in bed this morning in ICU eating breakfast. Patients wife is at his side this morning. No complaints overnight and vitals all remain stable.    Vitals:   12/18/23 0847 12/18/23 0900  BP: (!) 161/91 (!) 157/89  Pulse: 72 75  Resp:  (!) 22  Temp:  97.6 F (36.4 C)  SpO2:  100%   Physical Exam: Cardiac:  RRR.  Normal S1 and S2 no murmurs appreciated. Lungs: Lungs on auscultation are rhonchorous throughout.  Diminished in the bases.  No rales or wheezing noted.  Normal labored breathing on 2 L nasal cannula oxygen today Incisions: Right groin postoperative incision with wound VAC in place working well. Extremities: Bilateral lower extremities warm to touch with Doppler pulses. Abdomen: Positive bowel sounds throughout, soft, nontender nondistended. Neurologic: Alert and oriented x 3, answers all questions follows commands appropriately  CBC    Component Value Date/Time   WBC 10.3 12/18/2023 0345   RBC 3.48 (L) 12/18/2023 0345   HGB 10.2 (L) 12/18/2023 0345   HGB 15.7 02/16/2012 2013   HCT 31.5 (L) 12/18/2023 0345   HCT 44.4 02/16/2012 2013   PLT 254 12/18/2023 0345   PLT 176 02/16/2012 2013   MCV 90.5 12/18/2023 0345   MCV 91 02/16/2012 2013   MCH 29.3 12/18/2023 0345   MCHC 32.4  12/18/2023 0345   RDW 16.7 (H) 12/18/2023 0345   RDW 13.0 02/16/2012 2013   LYMPHSABS 0.5 (L) 12/04/2023 1141   MONOABS 0.5 12/04/2023 1141   EOSABS 0.1 12/04/2023 1141   BASOSABS 0.0 12/04/2023 1141    BMET    Component Value Date/Time   NA 136 12/17/2023 0447   NA 137 07/21/2023 1246   NA 137 02/16/2012 2013   K 4.8 12/17/2023 0447   K 3.8 02/16/2012 2013   CL 105 12/17/2023 0447   CL 102 02/16/2012 2013   CO2 26 12/17/2023 0447   CO2 25 02/16/2012 2013   GLUCOSE 84 12/17/2023 0447   GLUCOSE 154 (H) 02/16/2012 2013   BUN 21 12/17/2023 0447   BUN 30 (H) 07/21/2023 1246   BUN 10 02/16/2012 2013   CREATININE 0.75 12/17/2023 0447   CREATININE 0.72 02/16/2012 2013   CALCIUM  9.0 12/17/2023 0447   CALCIUM  9.6 02/16/2012 2013   GFRNONAA >60 12/17/2023 0447   GFRNONAA >60 02/16/2012 2013   GFRAA >60 12/09/2018 0929   GFRAA >60 02/16/2012 2013    INR    Component Value Date/Time   INR 1.3 (H) 12/12/2023 0816     Intake/Output Summary (Last 24 hours) at 12/18/2023 1002 Last data filed at 12/18/2023 0644 Gross per 24 hour  Intake 340 ml  Output 1005 ml  Net -665 ml     Assessment/Plan:  78 y.o. male is s/p  SEE ABOVE 1 Day Post-Op   PLAN Pain medication as needed Work with PT and OT Okay to transfer to the floor Plan to do another wound VAC change in the operating room on Tuesday next week.  DVT prophylaxis:  ASA a 81 mg daily, Plavix  75 mg, and Lovenox  40 mg subcu every 24    Manuel [name] R [name] 12/18/2023 10:02 AM

## 2023-12-18 NOTE — TOC Progression Note (Signed)
 Transition of Care [facility]) - Progression Note    Patient Details  Name: Manuel [name] MRN: [id number] Date of Birth: 05/29/1945  Transition of [facility]) CM/SW Contact  K'La JINNY Ruts, LCSW Phone Number: 12/18/2023, 2:30 PM  Clinical Narrative:    Chart reviewed. I spoke with the patient at bedside today. The patient identifed [facility] services. I called [facility] and spoke with Shaun and he accepted the patient. Patient will need IV antibiotic at discharge.       Barriers to Discharge: Continued Medical Work up               Expected Discharge Plan and Services       Living arrangements for the past 2 months: Single Family Home                                       Social Drivers of Health (SDOH) Interventions SDOH Screenings   Food Insecurity: No Food Insecurity (12/05/2023)  Housing: Low Risk  (12/05/2023)  Transportation Needs: No Transportation Needs (12/05/2023)  Utilities: Not At Risk (12/05/2023)  Depression (PHQ2-9): High Risk (09/11/2023)  Financial Resource Strain: Low Risk  (07/11/2023)  Social Connections: Moderately Isolated (12/05/2023)  Tobacco Use: Low Risk  (12/11/2023)    Readmission Risk Interventions    12/05/2023    3:09 PM 07/09/2023    8:30 PM 02/18/2023   11:58 AM  Readmission Risk Prevention Plan  Transportation Screening Complete Complete   PCP or Specialist Appt within 3-5 Days   Complete  HRI or Home Care Consult   Complete  Social Work Consult for Recovery Care Planning/Counseling   Complete  Palliative Care Screening   Not Applicable  Medication Review [employer]) Complete Complete Complete  PCP or Specialist appointment within 3-5 days of discharge Complete    HRI or Home Care Consult  Complete   SW Recovery Care/Counseling Consult Complete Complete   Palliative Care Screening Not Applicable Not Applicable   Skilled Nursing Facility Not Applicable Not Applicable

## 2023-12-18 NOTE — Progress Notes (Signed)
 Occupational Therapy Treatment Patient Details Name: Manuel [name] MRN: [id number] DOB: [birth year] Today's Date: 12/18/2023   History of present illness Patient is a 78 year old male presenting after fall while getting out of bed to wheelchair.  Found to have right femoral a rupture, vascular consulted and performed repair. Complicated by Hemorrhagic shock requiring ICU and pressors. PMH: CHF, CAD, PAD, R BKA, HTN, gout   OT comments  Chart reviewed to date, pt greeted semi supine in bed, agreeable to OT tx session targeting improving functional activity tolerance in prep for ADL tasks. Improvements noted on this date with pt requiring MIN A for bed mobility, CGA-MIN A +1-2 for lateral scoot up the bed. Supervision required for seated grooming tasks at edge of bed. Educated pt importance of continued attempts at mobility including out of bed in the future. Pt is left as received,all needs met. Wound vac intact pre/post session. OT will continue to follow.       If plan is discharge home, recommend the following:  A little help with walking and/or transfers;A little help with bathing/dressing/bathroom;Assistance with cooking/housework;Direct supervision/assist for medications management;Direct supervision/assist for financial management;Assist for transportation   Equipment Recommendations  None recommended by OT    Recommendations for Other Services      Precautions / Restrictions Precautions Precautions: Fall Recall of Precautions/Restrictions: Intact Precaution/Restrictions Comments: wound vac right groin Restrictions Weight Bearing Restrictions Per Provider Order: No       Mobility Bed Mobility Overal bed mobility: Needs Assistance Bed Mobility: Supine to Sit, Sit to Supine     Supine to sit: Min assist, +2 for physical assistance Sit to supine: Min assist, +2 for physical assistance        Transfers Overall transfer level: Needs assistance Equipment used: None               Lateral/Scoot Transfers: Contact guard assist, Min assist, +2 safety/equipment       Balance Overall balance assessment: Needs assistance Sitting-balance support: Feet unsupported, No upper extremity supported Sitting balance-Leahy Scale: Good                                     ADL either performed or assessed with clinical judgement   ADL Overall ADL's : Needs assistance/impaired Eating/Feeding: Set up;Sitting   Grooming: Set up;Sitting   Upper Body Bathing: Maximal assistance           Lower Body Dressing: Moderate assistance                      Extremity/Trunk Assessment              Vision       Perception     Praxis     Communication Communication Communication: Impaired Factors Affecting Communication: Hearing impaired   Cognition Arousal: Alert Behavior During Therapy: WFL for tasks assessed/performed Cognition: Cognition impaired         Attention impairment (select first level of impairment): Sustained attention                     Following commands: Impaired Following commands impaired: Follows one step commands with increased time      Cueing   Cueing Techniques: Verbal cues, Visual cues  Exercises Other Exercises Other Exercises: edu re: role of OT, role of rehab    Shoulder Instructions  General Comments vss throughout, wound vac intact pre/post session    Pertinent Vitals/ Pain       Pain Assessment Faces Pain Scale: Hurts little more Pain Location: R groin, with movement Pain Descriptors / Indicators: Grimacing, Moaning, Discomfort Pain Intervention(s): Monitored during session, Limited activity within patient's tolerance  Home Living                                          Prior Functioning/Environment              Frequency  Min 2X/week        Progress Toward Goals  OT Goals(current goals can now be found in the care plan section)   Progress towards OT goals: Progressing toward goals  Acute Rehab OT Goals Time For Goal Achievement: 12/29/23  Plan      Co-evaluation    PT/OT/SLP Co-Evaluation/Treatment: Yes Reason for Co-Treatment: Complexity of the patient's impairments (multi-system involvement);For patient/therapist safety PT goals addressed during session: Mobility/safety with mobility OT goals addressed during session: ADL's and self-care      AM-PAC OT 6 Clicks Daily Activity     Outcome Measure   Help from another person eating meals?: None Help from another person taking care of personal grooming?: None Help from another person toileting, which includes using toliet, bedpan, or urinal?: A Lot Help from another person bathing (including washing, rinsing, drying)?: A Lot Help from another person to put on and taking off regular upper body clothing?: A Little Help from another person to put on and taking off regular lower body clothing?: A Lot 6 Click Score: 17    End of Session Equipment Utilized During Treatment: Oxygen  OT Visit Diagnosis: Unsteadiness on feet (R26.81);Repeated falls (R29.6)   Activity Tolerance Patient tolerated treatment well   Patient Left in bed;with call bell/phone within reach   Nurse Communication Mobility status        Time: 8945-8879 OT Time Calculation (min): 26 min  Charges: OT General Charges $OT Visit: 1 Visit OT Treatments $Therapeutic Activity: 8-22 mins  Therisa Sheffield, OTD OTR/L  12/18/23, 12:29 PM

## 2023-12-18 NOTE — Plan of Care (Signed)
 Problem: Education: Goal: Knowledge of General Education information will improve Description: Including pain rating scale, medication(s)/side effects and non-pharmacologic comfort measures Outcome: Progressing   Problem: Health Behavior/Discharge Planning: Goal: Ability to manage health-related needs will improve Outcome: Progressing   Problem: Clinical Measurements: Goal: Ability to maintain clinical measurements within normal limits will improve Outcome: Progressing Goal: Will remain free from infection Outcome: Progressing Goal: Diagnostic test results will improve Outcome: Progressing Goal: Respiratory complications will improve Outcome: Progressing Goal: Cardiovascular complication will be avoided Outcome: Progressing   Problem: Activity: Goal: Risk for activity intolerance will decrease Outcome: Progressing   Problem: Nutrition: Goal: Adequate nutrition will be maintained Outcome: Progressing   Problem: Coping: Goal: Level of anxiety will decrease Outcome: Progressing   Problem: Elimination: Goal: Will not experience complications related to bowel motility Outcome: Progressing Goal: Will not experience complications related to urinary retention Outcome: Progressing   Problem: Pain Managment: Goal: General experience of comfort will improve and/or be controlled Outcome: Progressing   Problem: Safety: Goal: Ability to remain free from injury will improve Outcome: Progressing   Problem: Skin Integrity: Goal: Risk for impaired skin integrity will decrease Outcome: Progressing   Problem: Education: Goal: Ability to describe self-care measures that may prevent or decrease complications (Diabetes Survival Skills Education) will improve Outcome: Progressing Goal: Individualized Educational Video(s) Outcome: Progressing   Problem: Coping: Goal: Ability to adjust to condition or change in health will improve Outcome: Progressing   Problem: Fluid  Volume: Goal: Ability to maintain a balanced intake and output will improve Outcome: Progressing   Problem: Health Behavior/Discharge Planning: Goal: Ability to identify and utilize available resources and services will improve Outcome: Progressing Goal: Ability to manage health-related needs will improve Outcome: Progressing   Problem: Metabolic: Goal: Ability to maintain appropriate glucose levels will improve Outcome: Progressing   Problem: Nutritional: Goal: Maintenance of adequate nutrition will improve Outcome: Progressing Goal: Progress toward achieving an optimal weight will improve Outcome: Progressing   Problem: Skin Integrity: Goal: Risk for impaired skin integrity will decrease Outcome: Progressing   Problem: Tissue Perfusion: Goal: Adequacy of tissue perfusion will improve Outcome: Progressing   Problem: Education: Goal: Understanding of CV disease, CV risk reduction, and recovery process will improve Outcome: Progressing Goal: Individualized Educational Video(s) Outcome: Progressing   Problem: Activity: Goal: Ability to return to baseline activity level will improve Outcome: Progressing   Problem: Cardiovascular: Goal: Ability to achieve and maintain adequate cardiovascular perfusion will improve Outcome: Progressing Goal: Vascular access site(s) Level 0-1 will be maintained Outcome: Progressing   Problem: Health Behavior/Discharge Planning: Goal: Ability to safely manage health-related needs after discharge will improve Outcome: Progressing   Problem: Education: Goal: Ability to describe self-care measures that may prevent or decrease complications (Diabetes Survival Skills Education) will improve Outcome: Progressing Goal: Individualized Educational Video(s) Outcome: Progressing   Problem: Cardiac: Goal: Ability to maintain an adequate cardiac output will improve Outcome: Progressing   Problem: Health Behavior/Discharge Planning: Goal:  Ability to identify and utilize available resources and services will improve Outcome: Progressing Goal: Ability to manage health-related needs will improve Outcome: Progressing   Problem: Fluid Volume: Goal: Ability to achieve a balanced intake and output will improve Outcome: Progressing   Problem: Metabolic: Goal: Ability to maintain appropriate glucose levels will improve Outcome: Progressing   Problem: Nutritional: Goal: Maintenance of adequate nutrition will improve Outcome: Progressing Goal: Maintenance of adequate weight for body size and type will improve Outcome: Progressing   Problem: Respiratory: Goal: Will regain and/or maintain adequate ventilation Outcome:  Progressing   Problem: Urinary Elimination: Goal: Ability to achieve and maintain adequate renal perfusion and functioning will improve Outcome: Progressing

## 2023-12-18 NOTE — Progress Notes (Signed)
 Progress Note   Patient: Manuel [name] FMW:[id number] DOB: [birth year] DOA: 12/04/2023     14 DOS: the patient was seen and examined on 12/18/2023   Brief hospital course: 78 y.o. 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 Patient fell with walker landing on top of him.  EMS was called for assistance.  Patient refused to be transported to ED for evaluation initial. However as time when on patient note more intense pain in right groin area and due to his EMS was called back and patient was transported to ED.      ED Course:  99.3/tmx 100.7 BP 131/72, hr 113, rr 19 sat 95%  UA :rare bacteria , ketones 20 ,  Wbc 11.6, hgb 14.3, plt 191, increase pmn Na 126 ( 137), K 5.3, cl91, glu 438, cr 1.12 Lactic 2.9 ,3.3 Trop 284,3403, 8119 EKG: sinus tachycardia , RAD twave change inferior lateral leads   Patient on evaluation found to have right femoral a rupture, vascular consulted and performed repair.     Patient course was further complicated by NTSEMI , s/p right femoral a repair patient was started on heparin  drip . Cardiology was consulted and will see patient in am , but [no] plans for intervention  recommend medical management.    PCCM was involved for comanagement. TRH was consulted for admission and further management as below  8/27.  Patient to go to the operating room for first wound VAC change. 8/28.  Patient can transfer out of the ICU.  Vascular team plans on taking back to the OR on Tuesday for wound VAC change.  Assessment and Plan: * Rupture of femoral artery (HCC) 8/14.  Patient had percutaneous transluminal angioplasty and stent placement of the right profunda  femoris and common femoral artery extending into the distal right external iliac artery. 8/21.  Right external iliac artery to profunda femoris artery bypass with autologous ipsilateral superficial femoral artery, resection of infected right, femoral artery and removal of common femoral artery, profunda femoris artery and superficial femoral artery stents.  Harvest of right superficial femoral artery for bypass with endarterectomy of the right superficial femoral artery. 8/27.  Wound VAC changed in the operating room Will have another wound VAC change in the operating room on Tuesday  Severe sepsis (HCC) Secondary to Serratia.  Present on admission.  Currently on Maxipime  as per ID.  Will need total of 4 weeks of IV antibiotics with either Maxipime  or ertapenem.  Hemorrhagic shock (HCC) Resolved.  Chronic systolic CHF (congestive heart failure) (HCC) Last EF 20%, continue Coreg .  Will add low-dose losartan  this evening.  Uncontrolled type 2 diabetes mellitus with hyperglycemia, with long-term current use of insulin  (HCC) Now with hyperglycemia will go up to Lantus  12 units at bedtime.  Continue sliding scale.  Previously had hypoglycemia.  NSTEMI (non-ST elevated myocardial infarction) (HCC) Secondary to hemorrhagic shock.  Patient on aspirin , Plavix , Coreg  and statin.  Will add losartan .  Hyponatremia Last sodium normal range  PAD (peripheral artery disease) (HCC) Continue aspirin  and Plavix , history of right BKA.  AKI (acute kidney injury) (HCC) With metabolic acidosis.  Resolved.  Hyperkalemia Resolved  Sleep apnea Will need outpatient sleep study.  History of alcohol  abuse On thiamine   Iron  deficiency anemia Last hemoglobin 9.5.  Received 3 units of blood during the hospital course.        Subjective: Patient feels okay.  Offers no complaints.  Patient can be transferred out of the ICU.  Physical Exam: Vitals:   12/18/23 1106 12/18/23 1200 12/18/23 1300 12/18/23  1400  BP: 139/73 131/79 (!) 146/76 138/73  Pulse: 70 65 65 (!) 59  Resp: (!) 21 (!) 21 (!) 24 (!) 25  Temp:      TempSrc:      SpO2: 91% 100% 100% 100%  Weight:      Height:       Physical Exam HENT:     Head: Normocephalic.     Mouth/Throat:     Pharynx: No oropharyngeal exudate.  Eyes:     General: Lids are normal.     Conjunctiva/sclera: Conjunctivae normal.  Cardiovascular:     Rate and Rhythm: Normal rate and regular rhythm.     Heart sounds: Normal heart sounds, S1 normal and S2 normal.  Pulmonary:     Breath sounds: No decreased breath sounds, wheezing, rhonchi or rales.  Abdominal:     Palpations: Abdomen is soft.     Tenderness: There is no abdominal tenderness.  Musculoskeletal:     Right Lower Extremity: Right leg is amputated below knee.  Skin:    General: Skin is warm.     Comments: Wound VAC right groin.  Neurological:     Mental Status: He is alert and oriented to person, place, and time.     Data Reviewed: White blood count 10.3, hemoglobin 10.2, platelet count 254  Family Communication: Left message for wife  Disposition: Status is: Inpatient Remains inpatient appropriate because: Vascular surgery plans on taking back to the operating room for wound VAC change on Tuesday  Planned Discharge Destination: Home versus rehab depending on whether he wants to do home IV antibiotics or not    Time spent: 28 minutes  Author: Charlie Patterson, MD 12/18/2023 4:36 PM  For on call review [email or web address].

## 2023-12-18 NOTE — Progress Notes (Signed)
 Date of Admission:  12/04/2023      ID: Manuel [name] is a 78 y.o. male  Principal Problem:   Rupture of femoral artery (HCC) Active Problems:   PAD (peripheral artery disease) (HCC)   Chronic systolic CHF (congestive heart failure) (HCC)   AKI (acute kidney injury) (HCC)   Hyponatremia   Uncontrolled type 2 diabetes mellitus with hyperglycemia, with long-term current use of insulin  (HCC)   NSTEMI (non-ST elevated myocardial infarction) (HCC)   Ischemic cardiomyopathy   Coronary artery disease involving native coronary artery of native heart   Serratia infection   Vascular graft infection (HCC)   Hemorrhagic shock (HCC)   Severe sepsis (HCC)   Iron  deficiency anemia   History of alcohol  abuse   Sleep apnea   Hyperkalemia    Subjective: Patient is on the regular floor Doing well Appetite better Had a VAC change yesterday  Medications:   aspirin  EC  81 mg Oral Daily   atorvastatin   80 mg Oral QHS   bisacodyl   10 mg Oral QHS   carvedilol   6.25 mg Oral BID WC   Chlorhexidine  Gluconate Cloth  6 each Topical Daily   clopidogrel   75 mg Oral Daily   docusate sodium   100 mg Oral BID   enoxaparin  (LOVENOX ) injection  40 mg Subcutaneous Q24H   escitalopram   5 mg Oral Daily   feeding supplement (GLUCERNA SHAKE)  237 mL Oral TID BM   folic acid   1 mg Oral Daily   gabapentin   100 mg Oral QHS   insulin  aspart  0-15 Units Subcutaneous Q4H   insulin  glargine  12 Units Subcutaneous QHS   losartan   25 mg Oral QHS   pantoprazole   40 mg Oral Daily   polyethylene glycol  17 g Oral BID   sodium chloride  flush  3 mL Intravenous Q12H   thiamine   100 mg Oral Daily    Objective: Vital signs in last 24 hours: BP 110/60 (BP Location: Left Arm)   Pulse 62   Temp 98.2 F (36.8 C)   Resp 16   Ht 5' 8 (1.727 m)   Wt 93.4 kg   SpO2 95%   BMI 31.31 kg/m        PHYSICAL EXAM:  General: awake, alert, pale Lungs: b/l air entry Heart: s1s2 Extremities: Right groin area  has wound VAC Right BKA Neurologic: moves all limbs  Lab Results    Latest Ref Rng & Units 12/18/2023    3:45 AM 12/17/2023    4:47 AM 12/16/2023    5:34 AM  CBC  WBC 4.0 - 10.5 K/uL 10.3  9.2  11.2   Hemoglobin 13.0 - 17.0 g/dL 89.7  9.5  9.4   Hematocrit 39.0 - 52.0 % 31.5  29.2  28.8   Platelets 150 - 400 K/uL 254  225  204        Latest Ref Rng & Units 12/17/2023    4:47 AM 12/15/2023    5:20 AM 12/14/2023    4:33 AM  CMP  Glucose 70 - 99 mg/dL 84  882  820   BUN 8 - 23 mg/dL 21  25  26    Creatinine 0.61 - 1.24 mg/dL 9.24  9.13  8.90   Sodium 135 - 145 mmol/L 136  134  134   Potassium 3.5 - 5.1 mmol/L 4.8  4.6  4.4   Chloride 98 - 111 mmol/L 105  105  105   CO2 22 -  32 mmol/L 26  22  20    Calcium  8.9 - 10.3 mg/dL 9.0  8.7  8.3       Microbiology: 12/04/23 BC X 2 serratia 11/28/23 BC- NG  so far WC- x 2 serratia One of which is ceftriaxone  resistant   Assessment/Plan:  Right common femoral artery graft with rupture and leaking into the cutaneous tissue forming a hematoma.  Was taken for surgery underwent recent excision of the old graft and portions of the femoral artery and has a bypass graft with autologous external femoral artery.  Multiple cultures have been sent. Gram neg rod in gram stain  Serratia bacteremia.  Source is  the right groin bovine graft.  S/p removal of the graft Patient is on cefepime  Repeat blood culture neg  Underwent Right external iliac artery to profunda femoris bypass with autologous ipsilateral superficial femoral artery.  Resection of infected right common femoral artery and removal of common femoral artery profunda femoris and superficial femoral artery stents. Negative pressure dressing. Surgical Cultures X2  serratia Patient will need at least 4 weeks of IV antibiotics.  The options are either ertapenem or cefepime .  Ertapenem is once a day and is easy to administer as outpatient.  Whereas cefepime  is 3 times a day.   Anemia  Severe  PAD History of bilateral endarterectomies of the right And left common femoral artery, profunda artery and superficial femoral artery, Fogarty embolectomy of right SFA, stent placement bilateral common iliac arteries, left external artery and right PTA on 11/15/2022.  Also had bovine pericardial patch angioplasties.  This was complicated by wound dehiscence on the right groin in September 2024.  Underwent debridement superficial culture from the right groin in October 2024 was Serratia along with Staphylococcus aureus and he received 14 days of appropriate antibiotics.  At that time the endovascular graft infection was not a concern    Right BKA  Left leg edema with superficial wounds and possible cellulitis He has to keep the leg elevated when he is sitting.    CAD Diabetes mellitus  Discussed the management with patient and wife and hospitalist.

## 2023-12-19 ENCOUNTER — Encounter: Admitting: Family

## 2023-12-19 DIAGNOSIS — R578 Other shock: Secondary | ICD-10-CM | POA: Diagnosis not present

## 2023-12-19 DIAGNOSIS — R197 Diarrhea, unspecified: Secondary | ICD-10-CM | POA: Insufficient documentation

## 2023-12-19 DIAGNOSIS — I5022 Chronic systolic (congestive) heart failure: Secondary | ICD-10-CM | POA: Diagnosis not present

## 2023-12-19 DIAGNOSIS — A419 Sepsis, unspecified organism: Secondary | ICD-10-CM | POA: Diagnosis not present

## 2023-12-19 DIAGNOSIS — I772 Rupture of artery: Secondary | ICD-10-CM | POA: Diagnosis not present

## 2023-12-19 LAB — GLUCOSE, CAPILLARY
Glucose-Capillary: 102 mg/dL — ABNORMAL HIGH (ref 70–99)
Glucose-Capillary: 134 mg/dL — ABNORMAL HIGH (ref 70–99)
Glucose-Capillary: 127 mg/dL — ABNORMAL HIGH (ref 70–99)
Glucose-Capillary: 119 mg/dL — ABNORMAL HIGH (ref 70–99)

## 2023-12-19 LAB — GASTROINTESTINAL PANEL BY PCR, STOOL (REPLACES STOOL CULTURE)

## 2023-12-19 MED ORDER — SODIUM CHLORIDE 0.9 % IV SOLN: INTRAVENOUS | Status: DC

## 2023-12-19 MED ORDER — CARVEDILOL 6.25 MG PO TABS
3.1250 mg | ORAL_TABLET | Freq: Two times a day (BID) | ORAL | Status: DC
  Administered 2023-12-20 – 2024-01-01 (×23): 3.125 mg via ORAL
  Filled 2023-12-19 (×24): qty 1

## 2023-12-19 MED ORDER — MAGNESIUM SULFATE 2 GM/50ML IV SOLN
2.0000 g | Freq: Once | INTRAVENOUS | Status: AC
  Administered 2023-12-19: 2 g via INTRAVENOUS
  Filled 2023-12-19: qty 50

## 2023-12-19 NOTE — Treatment Plan (Signed)
 Diagnosis: Serratia bacteremia Vascular graft infection Baseline Creatinine 0.75    No Known Allergies  OPAT Orders Discharge antibiotics: Ertapenem 1 g IV every 24 hours   Duration: 4 weeks may extend  depending on the healing of the wound End Date: 01/08/24   [facility] Care Per Protocol:  Labs weekly while on IV antibiotics: X__ CBC with differential  _X_ CMP    _X_ Please leave PIC in place until doctor has seen patient or been notified  Fax weekly lab results  promptly to [phone]  Clinic Follow Up Appt: 01/06/24 at 11.30 am with Dr.Altheia Shafran   Call [phone] with nay questions or critical values

## 2023-12-19 NOTE — Progress Notes (Signed)
 Progress Note   Patient: Manuel [name] FMW:[id number] DOB: Mar 07, 1946 DOA: 12/04/2023     15 DOS: the patient was seen and examined on 12/19/2023   Brief hospital course: 78 y.o. 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 Patient fell with walker landing on top of him.  EMS was called for assistance.  Patient refused to be transported to ED for evaluation initial. However as time when on patient note more intense pain in right groin area and due to his EMS was called back and patient was transported to ED.      ED Course:  99.3/tmx 100.7 BP 131/72, hr 113, rr 19 sat 95%  UA :rare bacteria , ketones 20 ,  Wbc 11.6, hgb 14.3, plt 191, increase pmn Na 126 ( 137), K 5.3, cl91, glu 438, cr 1.12 Lactic 2.9 ,3.3 Trop 284,3403, 8119 EKG: sinus tachycardia , RAD twave change inferior lateral leads   Patient on evaluation found to have right femoral a rupture, vascular consulted and performed repair.     Patient course was further complicated by NTSEMI , s/p right femoral a repair patient was started on heparin  drip . Cardiology was consulted recommend medical management.    PCCM was involved for comanagement. TRH was consulted for admission and further management as below  8/27.  Patient to go to the operating room for first wound VAC change. 8/28.  Patient can transfer out of the ICU.  Vascular team plans on taking back to the OR on Tuesday for wound VAC change. 8/29.  Patient and wife interested in going home after hospitalization.  Will need IV antibiotics upon discharge.   Assessment and Plan: * Rupture of femoral artery (HCC) 8/14.  Patient had percutaneous transluminal  angioplasty and stent placement of the right profunda femoris and common femoral artery extending into the distal right external iliac artery. 8/21.  Right external iliac artery to profunda femoris artery bypass with autologous ipsilateral superficial femoral artery, resection of infected right, femoral artery and removal of common femoral artery, profunda femoris artery and superficial femoral artery stents.  Harvest of right superficial femoral artery for bypass with endarterectomy of the right superficial femoral artery. 8/27.  Wound VAC changed in the operating room Will have another wound VAC change in the operating room on Tuesday  Severe sepsis (HCC) Secondary to Serratia.  Present on admission.  Currently on Maxipime  as per ID.  Will need total of 4 weeks of IV antibiotics with either Maxipime  or ertapenem.  Patient and wife want to do IV antibiotics at home.  Hemorrhagic shock (HCC) Resolved.  Chronic systolic CHF (congestive heart failure) (HCC) Last EF 20%, continue Coreg , low-dose losartan   Uncontrolled type 2 diabetes mellitus with hyperglycemia, with long-term current use of insulin  (HCC) Continue Lantus  12 units at bedtime.  Continue sliding scale.  Previously had hypoglycemia.  NSTEMI (non-ST elevated myocardial infarction) (HCC) Secondary to hemorrhagic shock.  Patient on aspirin , Plavix , Coreg , losartan  and statin.   Hyponatremia Last sodium normal range  PAD (peripheral artery disease) (HCC) Continue aspirin  and Plavix , history of right BKA.  AKI (acute kidney injury) (HCC) With metabolic acidosis.  Resolved.  Diarrhea Stop 3 laxatives.  Gentle fluids overnight.  Stool studies negative.  Hyperkalemia Resolved  Sleep apnea Will need outpatient sleep study.  Will check overnight oximetry on room air  History of alcohol  abuse On thiamine   Iron  deficiency anemia Last hemoglobin 10.2.  Received 3 units of blood during the hospital course.         Subjective: Patient had quite a few episodes of diarrhea.  Stopped all the stool softeners that he was on.  Patient did not eat too much today.  Initially admitted with femoral artery rupture.  Physical Exam: Vitals:   12/18/23 1737 12/18/23 2053 12/19/23 0544 12/19/23 0826  BP: 139/78 110/60 (!) 107/57 137/84  Pulse: 62 62 (!) 56 72  Resp: 18 16 16    Temp: 97.8 F (36.6 C) 98.2 F (36.8 C) 98.6 F (37 C) 97.8 F (36.6 C)  TempSrc:    Oral  SpO2: 100% 95% 92% 92%  Weight:      Height:       Physical Exam HENT:     Head: Normocephalic.     Mouth/Throat:     Pharynx: No oropharyngeal exudate.  Eyes:     General: Lids are normal.     Conjunctiva/sclera: Conjunctivae normal.  Cardiovascular:     Rate and Rhythm: Normal rate and regular rhythm.     Heart sounds: Normal heart sounds, S1 normal and S2 normal.  Pulmonary:     Breath sounds: No decreased breath sounds, wheezing, rhonchi or rales.  Abdominal:     Palpations: Abdomen is soft.     Tenderness: There is no abdominal tenderness.  Musculoskeletal:     Right Lower Extremity: Right leg is amputated below knee.  Skin:    General: Skin is warm.     Comments: Wound VAC right groin.  Neurological:     Mental Status: He is alert and oriented to person, place, and time.     Data Reviewed: Blood cell count 10.3 with hemoglobin 10.2  Family Communication: Wife at bedside  Disposition: Status is: Inpatient Remains inpatient appropriate because: Patient will go to the operating room on Tuesday for wound VAC change.  Patient and patient's wife interested in going home with IV antibiotics.  Planned Discharge Destination: Home with Home Health    Time spent: 28 minutes  Author: Charlie Patterson, MD 12/19/2023 1:42 PM  For on call review [email or web address].

## 2023-12-19 NOTE — Progress Notes (Addendum)
 Occupational Therapy Treatment Patient Details Name: Manuel [name] MRN: [id number] DOB: [birth year] Today's Date: 12/19/2023   History of present illness Patient is a 78 year old male presenting after fall while getting out of bed to wheelchair.  Found to have right femoral a rupture, vascular consulted and performed repair. Complicated by Hemorrhagic shock requiring ICU and pressors. PMH: CHF, CAD, PAD, R BKA, HTN, gout   OT comments  Chart reviewed to date, pt greeted in semi supine in bed, agreeable to OT evaluation targeting improving functional activity tolerance in prep for ADL tasks. Pt is making progress towards goals as evidenced by performing bed mobility with CGA. He transfers to bedside chair via squat pivot to the R (simulated home set up)with MOD-MAX A +1-2, frequent cueing for technique. Pt continues to perform ADL/functional mobility below PLOF, will benefit from acute OT to address deficits and to facilitate optimal ADL/functional mobility performance.   Addendum 15:15 assisted patient back to bed via squat pivot transfer with MOD A +1, frequent cues for technique. He reports he will ask his wife to bring in his prosthetic tomorrow. Also discussed discharge recommendations and increased assist required at this time for functional transfers and ADLs. Discharge recommendation has been updated to reflect current level of function. OT will continue to follow.      If plan is discharge home, recommend the following:  A lot of help with walking and/or transfers;A lot of help with bathing/dressing/bathroom   Equipment Recommendations  None recommended by OT    Recommendations for Other Services Rehab consult    Precautions / Restrictions Precautions Precautions: Fall Recall of Precautions/Restrictions: Intact Precaution/Restrictions Comments: wound vac right groin Restrictions Weight Bearing Restrictions Per Provider Order: No       Mobility Bed Mobility Overal bed  mobility: Needs Assistance Bed Mobility: Supine to Sit     Supine to sit: Min assist, HOB elevated          Transfers Overall transfer level: Needs assistance Equipment used: None Transfers: Bed to chair/wheelchair/BSC            Lateral/Scoot Transfers: Mod assist, Max assist, +2 safety/equipment, +2 physical assistance General transfer comment: frequent verbal cues for technique     Balance Overall balance assessment: Needs assistance Sitting-balance support: Feet unsupported, No upper extremity supported Sitting balance-Leahy Scale: Good                                     ADL either performed or assessed with clinical judgement   ADL Overall ADL's : Needs assistance/impaired Eating/Feeding: Set up;Sitting   Grooming: Set up;Sitting                   Toilet Transfer: Moderate assistance;Maximal assistance;+2 for physical assistance Toilet Transfer Details (indicate cue type and reason): simulated, squat pivot to bedside chair Toileting- Clothing Manipulation and Hygiene: Maximal assistance;Bed level              Extremity/Trunk Assessment              Vision       Perception     Praxis     Communication Communication Communication: Impaired Factors Affecting Communication: Hearing impaired   Cognition Arousal: Alert Behavior During Therapy: WFL for tasks assessed/performed Cognition: Cognition impaired         Attention impairment (select first level of impairment): Sustained attention Executive functioning impairment (select all impairments):  Problem solving OT - Cognition Comments: verbose at times                 Following commands: Impaired Following commands impaired: Follows one step commands with increased time      Cueing   Cueing Techniques: Verbal cues, Visual cues  Exercises Other Exercises Other Exercises: edu re importance of continued mobility attempts    Shoulder Instructions        General Comments vss throughout on RA (put back on Toyah at end of session), wound vac intact pre/post session    Pertinent Vitals/ Pain       Pain Assessment Pain Assessment: Faces Faces Pain Scale: Hurts even more Pain Location: generalized, siatic pain after tranfser to chair Pain Descriptors / Indicators: Grimacing, Moaning, Discomfort Pain Intervention(s): Monitored during session, Limited activity within patient's tolerance, Repositioned, RN gave pain meds during session (RN in room after transfer to chair)  Home Living                                          Prior Functioning/Environment              Frequency  Min 3X/week        Progress Toward Goals  OT Goals(current goals can now be found in the care plan section)  Progress towards OT goals: Progressing toward goals  Acute Rehab OT Goals Time For Goal Achievement: 12/29/23  Plan      Co-evaluation    PT/OT/SLP Co-Evaluation/Treatment: Yes Reason for Co-Treatment: Complexity of the patient's impairments (multi-system involvement);For patient/therapist safety   OT goals addressed during session: ADL's and self-care      AM-PAC OT 6 Clicks Daily Activity     Outcome Measure   Help from another person eating meals?: None Help from another person taking care of personal grooming?: None Help from another person toileting, which includes using toliet, bedpan, or urinal?: A Lot Help from another person bathing (including washing, rinsing, drying)?: A Lot Help from another person to put on and taking off regular upper body clothing?: A Little Help from another person to put on and taking off regular lower body clothing?: A Lot 6 Click Score: 17    End of Session Equipment Utilized During Treatment: Oxygen  OT Visit Diagnosis: Unsteadiness on feet (R26.81);Repeated falls (R29.6)   Activity Tolerance Patient tolerated treatment well   Patient Left in chair;with call bell/phone within  reach;with chair alarm set (in care of PT)   Nurse Communication Mobility status        Time: 1356-1420 OT Time Calculation (min): 24 min  Charges: OT General Charges $OT Visit: 1 Visit OT Treatments $Therapeutic Activity: 8-22 mins  Therisa Sheffield, OTD OTR/L  12/19/23, 3:46 PM

## 2023-12-19 NOTE — Assessment & Plan Note (Addendum)
 Stopped 3 laxatives.  Stool studies negative.

## 2023-12-19 NOTE — Progress Notes (Addendum)
 Date of Admission:  12/04/2023      ID: Manuel [name] is a 78 y.o. male  Principal Problem:   Rupture of femoral artery (HCC) Active Problems:   PAD (peripheral artery disease) (HCC)   Chronic systolic CHF (congestive heart failure) (HCC)   AKI (acute kidney injury) (HCC)   Hyponatremia   Uncontrolled type 2 diabetes mellitus with hyperglycemia, with long-term current use of insulin  (HCC)   NSTEMI (non-ST elevated myocardial infarction) (HCC)   Ischemic cardiomyopathy   Coronary artery disease involving native coronary artery of native heart   Serratia infection   Vascular graft infection (HCC)   Hemorrhagic shock (HCC)   Severe sepsis (HCC)   Iron  deficiency anemia   History of alcohol  abuse   Sleep apnea   Hyperkalemia   Gram-negative bacteremia    Subjective: Patient is doing well No new complaints  Medications:   aspirin  EC  81 mg Oral Daily   atorvastatin   80 mg Oral QHS   carvedilol   6.25 mg Oral BID WC   Chlorhexidine  Gluconate Cloth  6 each Topical Daily   clopidogrel   75 mg Oral Daily   enoxaparin  (LOVENOX ) injection  40 mg Subcutaneous Q24H   escitalopram   5 mg Oral Daily   feeding supplement (GLUCERNA SHAKE)  237 mL Oral TID BM   folic acid   1 mg Oral Daily   gabapentin   100 mg Oral QHS   insulin  aspart  0-15 Units Subcutaneous Q4H   insulin  glargine  12 Units Subcutaneous QHS   losartan   25 mg Oral QHS   pantoprazole   40 mg Oral Daily   sodium chloride  flush  3 mL Intravenous Q12H   thiamine   100 mg Oral Daily    Objective: Vital signs in last 24 hours: BP 137/84 (BP Location: Left Arm)   Pulse 72   Temp 97.8 F (36.6 C) (Oral)   Resp 16   Ht 5' 8 (1.727 m)   Wt 93.4 kg   SpO2 92%   BMI 31.31 kg/m        PHYSICAL EXAM:  General: awake, alert, pale Lungs: b/l air entry Heart: s1s2 Extremities: Right groin area has wound VAC Right BKA Neurologic: moves all limbs  Lab Results    Latest Ref Rng & Units 12/18/2023    3:45  AM 12/17/2023    4:47 AM 12/16/2023    5:34 AM  CBC  WBC 4.0 - 10.5 K/uL 10.3  9.2  11.2   Hemoglobin 13.0 - 17.0 g/dL 89.7  9.5  9.4   Hematocrit 39.0 - 52.0 % 31.5  29.2  28.8   Platelets 150 - 400 K/uL 254  225  204        Latest Ref Rng & Units 12/17/2023    4:47 AM 12/15/2023    5:20 AM 12/14/2023    4:33 AM  CMP  Glucose 70 - 99 mg/dL 84  882  820   BUN 8 - 23 mg/dL 21  25  26    Creatinine 0.61 - 1.24 mg/dL 9.24  9.13  8.90   Sodium 135 - 145 mmol/L 136  134  134   Potassium 3.5 - 5.1 mmol/L 4.8  4.6  4.4   Chloride 98 - 111 mmol/L 105  105  105   CO2 22 - 32 mmol/L 26  22  20    Calcium  8.9 - 10.3 mg/dL 9.0  8.7  8.3       Microbiology: 12/04/23 [facility]  X 2 serratia 11/28/23 BC- NG  so far WC- x 2 serratia One of which is ceftriaxone  resistant   Assessment/Plan:  Right common femoral artery graft with rupture and leaking into the cutaneous tissue forming a hematoma.  Was taken for surgery underwent recent excision of the old graft and portions of the femoral artery and has a bypass graft with autologous external femoral artery.  Multiple cultures have been sent. Gram neg rod in gram stain  Serratia bacteremia.  Source is  the right groin bovine graft.  S/p removal of the graft Patient is on cefepime  Repeat blood culture neg  Underwent Right external iliac artery to profunda femoris bypass with autologous ipsilateral superficial femoral artery.  Resection of infected right common femoral artery and removal of common femoral artery profunda femoris and superficial femoral artery stents. Negative pressure dressing. Surgical Cultures X2  serratia Patient will need at least 4 weeks of IV antibiotics.  Will do ertapenem 1 g IV on discharge as it is easy to administer as he be going home week Will not do quinolone because of femoral artery rupture  Anemia  Severe PAD History of bilateral endarterectomies of the right And left common femoral artery, profunda artery and superficial  femoral artery, Fogarty embolectomy of right SFA, stent placement bilateral common iliac arteries, left external artery and right PTA on 11/15/2022.  Also had bovine pericardial patch angioplasties.  This was complicated by wound dehiscence on the right groin in September 2024.  Underwent debridement superficial culture from the right groin in October 2024 was Serratia along with Staphylococcus aureus and he received 14 days of appropriate antibiotics.  At that time the endovascular graft infection was not a concern    Right BKA  Left leg edema with superficial wounds and possible cellulitis He has to keep the leg elevated when he is sitting.    CAD Diabetes mellitus  Discussed the management with patient and wife and pharmacist OPAT orders will be placed

## 2023-12-19 NOTE — Progress Notes (Signed)
 Physical Therapy Treatment Patient Details Name: Manuel [name] MRN: [id number] DOB: [birth year] Today's Date: 12/19/2023   History of Present Illness Patient is a 78 year old male presenting after fall while getting out of bed to wheelchair.  Found to have right femoral a rupture, vascular consulted and performed repair. Complicated by Hemorrhagic shock requiring ICU and pressors. PMH: CHF, CAD, PAD, R BKA, HTN, gout    PT Comments  Patient alert, seen by PT/OT to maximize safety for OOB mobility, reported 6-7/10 pain but agreeable to mobility anyways, RN notified. Overall the patient continued to show motivation to return to PLOF (able to use prosthetic, modI-I for mobility). MinA for bed mobility especially for weight shift, and able to sit in midline with fair balance. Lateral scoot to recliner with mod-maxAx2, pt reported fatigue but able to participate and reposition in recliner as well.  Overall the patient demonstrated deficits (see PT Problem List) that impede the patient's functional abilities, safety, and mobility and would benefit from skilled PT intervention, (>3hrs a day) to return to PLOF.   If plan is discharge home, recommend the following: A little help with walking and/or transfers;A little help with bathing/dressing/bathroom;Assist for transportation;Help with stairs or ramp for entrance;Assistance with cooking/housework   Can travel by private vehicle        Equipment Recommendations  None recommended by PT    Recommendations for Other Services Rehab consult     Precautions / Restrictions Precautions Precautions: Fall Recall of Precautions/Restrictions: Intact Precaution/Restrictions Comments: wound vac right groin Restrictions Weight Bearing Restrictions Per Provider Order: No     Mobility  Bed Mobility Overal bed mobility: Needs Assistance Bed Mobility: Supine to Sit     Supine to sit: Min assist, HOB elevated          Transfers Overall transfer  level: Needs assistance Equipment used: None Transfers: Bed to chair/wheelchair/BSC            Lateral/Scoot Transfers: Mod assist, Max assist, +2 safety/equipment, +2 physical assistance General transfer comment: frequent verbal cues for technique    Ambulation/Gait                   Stairs             Wheelchair Mobility     Tilt Bed    Modified Rankin (Stroke Patients Only)       Balance Overall balance assessment: Needs assistance Sitting-balance support: Feet unsupported, No upper extremity supported Sitting balance-Leahy Scale: Good                                      Communication Communication Communication: Impaired Factors Affecting Communication: Hearing impaired  Cognition Arousal: Alert Behavior During Therapy: WFL for tasks assessed/performed   PT - Cognitive impairments: No apparent impairments                         Following commands: Impaired Following commands impaired: Follows one step commands with increased time    Cueing Cueing Techniques: Verbal cues, Visual cues  Exercises      General Comments General comments (skin integrity, edema, etc.): vss throughout on RA (put back on [place] at end of session), wound vac intact pre/post session      Pertinent Vitals/Pain Pain Assessment Pain Assessment: Faces Faces Pain Scale: Hurts even more    Home Living  Prior Function            PT Goals (current goals can now be found in the care plan section) Progress towards PT goals: Progressing toward goals    Frequency    Min 2X/week      PT Plan      Co-evaluation PT/OT/SLP Co-Evaluation/Treatment: Yes Reason for Co-Treatment: Complexity of the patient's impairments (multi-system involvement);For patient/therapist safety PT goals addressed during session: Mobility/safety with mobility OT goals addressed during session: ADL's and self-care       AM-PAC PT 6 Clicks Mobility   Outcome Measure  Help needed turning from your back to your side while in a flat bed without using bedrails?: A Little Help needed moving from lying on your back to sitting on the side of a flat bed without using bedrails?: A Little Help needed moving to and from a bed to a chair (including a wheelchair)?: A Lot Help needed standing up from a chair using your arms (e.g., wheelchair or bedside chair)?: A Lot Help needed to walk in hospital room?: Total Help needed climbing 3-5 steps with a railing? : Total 6 Click Score: 12    End of Session   Activity Tolerance: Patient tolerated treatment well Patient left: in chair;with call bell/phone within reach;with chair alarm set Nurse Communication: Mobility status PT Visit Diagnosis: Difficulty in walking, not elsewhere classified (R26.2);Muscle weakness (generalized) (M62.81)     Time: 8649-8574 PT Time Calculation (min) (ACUTE ONLY): 35 min  Charges:    $Therapeutic Activity: 8-22 mins PT General Charges $$ ACUTE PT VISIT: 1 Visit                     Doyal Shams PT, DPT 4:18 PM,12/19/23

## 2023-12-19 NOTE — Progress Notes (Signed)
 Progress Note    12/19/2023 4:05 PM 2 Days Post-Op  Subjective:  Manuel [name] is an 78 yo male now POD #6 From:   PROCEDURE: 1.   Right groin reexploration for bleeding 2.   Evacuation of right groin hematoma 3.   Suture repair of proximal bypass anastomosis in the distal external iliac artery 4.   Replacement of the sartorius flap for coverage 5.   Placement of gentamicin  and vancomycin  antibiotic impregnated beads 6.   Negative pressure dressing placement right groin  Patient has now POD #2    PROCEDURE: Irrigation and debridement of skin, soft tissue, and muscle of the right groin wound.  Placement of negative pressure dressing right groin.   Patient is resting comfortably in bed this morning in ICU eating breakfast. Patients wife is at his side this morning. No complaints overnight and vitals all remain stable.   Vitals:   12/19/23 0826 12/19/23 1511  BP: 137/84 (!) 104/55  Pulse: 72 (!) 55  Resp:  16  Temp: 97.8 F (36.6 C) 97.8 F (36.6 C)  SpO2: 92% 98%   Physical Exam: Cardiac:  RRR.  Normal S1 and S2 no murmurs appreciated. Lungs: Lungs on auscultation are rhonchorous throughout.  Diminished in the bases.  No rales or wheezing noted.  Normal labored breathing on 2 L nasal cannula oxygen today Incisions: Right groin postoperative incision with wound VAC in place working well. Extremities: Bilateral lower extremities warm to touch with Doppler pulses. Abdomen: Positive bowel sounds throughout, soft, nontender nondistended. Neurologic: Alert and oriented x 3, answers all questions follows commands appropriately  CBC    Component Value Date/Time   WBC 10.3 12/18/2023 0345   RBC 3.48 (L) 12/18/2023 0345   HGB 10.2 (L) 12/18/2023 0345   HGB 15.7 02/16/2012 2013   HCT 31.5 (L) 12/18/2023 0345   HCT 44.4 02/16/2012 2013   PLT 254 12/18/2023 0345   PLT 176 02/16/2012 2013   MCV 90.5 12/18/2023 0345   MCV 91 02/16/2012 2013   MCH 29.3 12/18/2023 0345   MCHC  32.4 12/18/2023 0345   RDW 16.7 (H) 12/18/2023 0345   RDW 13.0 02/16/2012 2013   LYMPHSABS 0.5 (L) 12/04/2023 1141   MONOABS 0.5 12/04/2023 1141   EOSABS 0.1 12/04/2023 1141   BASOSABS 0.0 12/04/2023 1141    BMET    Component Value Date/Time   NA 136 12/17/2023 0447   NA 137 07/21/2023 1246   NA 137 02/16/2012 2013   K 4.8 12/17/2023 0447   K 3.8 02/16/2012 2013   CL 105 12/17/2023 0447   CL 102 02/16/2012 2013   CO2 26 12/17/2023 0447   CO2 25 02/16/2012 2013   GLUCOSE 84 12/17/2023 0447   GLUCOSE 154 (H) 02/16/2012 2013   BUN 21 12/17/2023 0447   BUN 30 (H) 07/21/2023 1246   BUN 10 02/16/2012 2013   CREATININE 0.75 12/17/2023 0447   CREATININE 0.72 02/16/2012 2013   CALCIUM  9.0 12/17/2023 0447   CALCIUM  9.6 02/16/2012 2013   GFRNONAA >60 12/17/2023 0447   GFRNONAA >60 02/16/2012 2013   GFRAA >60 12/09/2018 0929   GFRAA >60 02/16/2012 2013    INR    Component Value Date/Time   INR 1.3 (H) 12/12/2023 0816     Intake/Output Summary (Last 24 hours) at 12/19/2023 1605 Last data filed at 12/19/2023 1405 Gross per 24 hour  Intake 0 ml  Output 350 ml  Net -350 ml     Assessment/Plan:  78 y.o. male  is s/p See ABOVE 2 Days Post-Op   PLAN Pain medication as needed Work with PT and OT Okay to transfer to the floor Plan to do another wound VAC change in the operating room on Tuesday next week.   DVT prophylaxis:  ASA a 81 mg daily, Plavix  75 mg, and Lovenox  40 mg subcu every 24     [name] R Gertrue [facility] 12/19/2023 4:05 PM

## 2023-12-19 NOTE — TOC Progression Note (Signed)
 Transition of Care [facility]) - Progression Note    Patient Details  Name: CYPRUS KUANG MRN: [id number] Date of Birth: 1945/11/13  Transition of Care [facility]) CM/SW Contact  Corean ONEIDA Haddock, RN Phone Number: 12/19/2023, 3:00 PM  Clinical Narrative:     Holley with Marlo and Shuan with [facility] aware that patient will require IV antibiotics at discharge.  Plan for wound vac change in OR next Tuesday Referral to [facility] with 8M for home wound vac      Barriers to Discharge: Continued Medical Work up               Expected Discharge Plan and Services       Living arrangements for the past 2 months: Single Family Home                                       Social Drivers of Health (SDOH) Interventions SDOH Screenings   Food Insecurity: No Food Insecurity (12/05/2023)  Housing: Low Risk  (12/05/2023)  Transportation Needs: No Transportation Needs (12/05/2023)  Utilities: Not At Risk (12/05/2023)  Depression (PHQ2-9): High Risk (09/11/2023)  Financial Resource Strain: Low Risk  (07/11/2023)  Social Connections: Moderately Isolated (12/05/2023)  Tobacco Use: Low Risk  (12/11/2023)    Readmission Risk Interventions    12/05/2023    3:09 PM 07/09/2023    8:30 PM 02/18/2023   11:58 AM  Readmission Risk Prevention Plan  Transportation Screening Complete Complete   PCP or Specialist Appt within 3-5 Days   Complete  HRI or Home Care Consult   Complete  Social Work Consult for Recovery Care Planning/Counseling   Complete  Palliative Care Screening   Not Applicable  Medication Review Oceanographer) Complete Complete Complete  PCP or Specialist appointment within 3-5 days of discharge Complete    HRI or Home Care Consult  Complete   SW Recovery Care/Counseling Consult Complete Complete   Palliative Care Screening Not Applicable Not Applicable   Skilled Nursing Facility Not Applicable Not Applicable

## 2023-12-20 DIAGNOSIS — A419 Sepsis, unspecified organism: Secondary | ICD-10-CM | POA: Diagnosis not present

## 2023-12-20 DIAGNOSIS — I4729 Other ventricular tachycardia: Secondary | ICD-10-CM | POA: Insufficient documentation

## 2023-12-20 DIAGNOSIS — I772 Rupture of artery: Secondary | ICD-10-CM | POA: Diagnosis not present

## 2023-12-20 DIAGNOSIS — R578 Other shock: Secondary | ICD-10-CM | POA: Diagnosis not present

## 2023-12-20 DIAGNOSIS — A498 Other bacterial infections of unspecified site: Secondary | ICD-10-CM | POA: Diagnosis not present

## 2023-12-20 LAB — BASIC METABOLIC PANEL WITH GFR
Anion gap: 5 (ref 5–15)
BUN: 31 mg/dL — ABNORMAL HIGH (ref 8–23)
CO2: 22 mmol/L (ref 22–32)
Calcium: 8.5 mg/dL — ABNORMAL LOW (ref 8.9–10.3)
Chloride: 108 mmol/L (ref 98–111)
Creatinine, Ser: 0.78 mg/dL (ref 0.61–1.24)
GFR, Estimated: >60 mL/min (ref >60)
Glucose, Bld: 79 mg/dL (ref 70–99)
Potassium: 4.5 mmol/L (ref 3.5–5.1)
Sodium: 135 mmol/L (ref 135–145)

## 2023-12-20 LAB — GLUCOSE, CAPILLARY
Glucose-Capillary: 78 mg/dL (ref 70–99)
Glucose-Capillary: 76 mg/dL (ref 70–99)
Glucose-Capillary: 127 mg/dL — ABNORMAL HIGH (ref 70–99)
Glucose-Capillary: 125 mg/dL — ABNORMAL HIGH (ref 70–99)
Glucose-Capillary: 142 mg/dL — ABNORMAL HIGH (ref 70–99)

## 2023-12-20 LAB — CBC
HCT: 27.4 % — ABNORMAL LOW (ref 39.0–52.0)
Hemoglobin: 8.8 g/dL — ABNORMAL LOW (ref 13.0–17.0)
MCH: 29.5 pg (ref 26.0–34.0)
MCHC: 32.1 g/dL (ref 30.0–36.0)
MCV: 91.9 fL (ref 80.0–100.0)
Platelets: 220 K/uL (ref 150–400)
RBC: 2.98 MIL/uL — ABNORMAL LOW (ref 4.22–5.81)
RDW: 16.6 % — ABNORMAL HIGH (ref 11.5–15.5)
WBC: 6.2 K/uL (ref 4.0–10.5)
nRBC: 0 % (ref 0.0–0.2)

## 2023-12-20 MED ORDER — SPIRONOLACTONE 12.5 MG HALF TABLET
12.5000 mg | ORAL_TABLET | Freq: Every day | ORAL | Status: DC
  Administered 2023-12-21 – 2023-12-22 (×2): 12.5 mg via ORAL
  Filled 2023-12-20 (×2): qty 1

## 2023-12-20 MED ORDER — INSULIN GLARGINE 100 UNIT/ML ~~LOC~~ SOLN
10.0000 [IU] | Freq: Every day | SUBCUTANEOUS | Status: DC
  Administered 2023-12-20: 10 [IU] via SUBCUTANEOUS
  Filled 2023-12-20 (×4): qty 0.1

## 2023-12-20 NOTE — Plan of Care (Signed)
  Problem: Education: Goal: Knowledge of General Education information will improve Description: Including pain rating scale, medication(s)/side effects and non-pharmacologic comfort measures Outcome: Progressing   Problem: Health Behavior/Discharge Planning: Goal: Ability to manage health-related needs will improve Outcome: Progressing   Problem: Clinical Measurements: Goal: Ability to maintain clinical measurements within normal limits will improve Outcome: Progressing Goal: Will remain free from infection Outcome: Progressing Goal: Diagnostic test results will improve Outcome: Progressing Goal: Respiratory complications will improve Outcome: Progressing Goal: Cardiovascular complication will be avoided Outcome: Progressing   Problem: Activity: Goal: Risk for activity intolerance will decrease Outcome: Progressing   Problem: Nutrition: Goal: Adequate nutrition will be maintained Outcome: Progressing   Problem: Coping: Goal: Level of anxiety will decrease Outcome: Progressing   Problem: Elimination: Goal: Will not experience complications related to bowel motility Outcome: Progressing Goal: Will not experience complications related to urinary retention Outcome: Progressing   Problem: Safety: Goal: Ability to remain free from injury will improve Outcome: Progressing   Problem: Skin Integrity: Goal: Risk for impaired skin integrity will decrease Outcome: Progressing   Problem: Fluid Volume: Goal: Ability to maintain a balanced intake and output will improve Outcome: Progressing   Problem: Coping: Goal: Ability to adjust to condition or change in health will improve Outcome: Progressing   Problem: Health Behavior/Discharge Planning: Goal: Ability to identify and utilize available resources and services will improve Outcome: Progressing Goal: Ability to manage health-related needs will improve Outcome: Progressing   Problem: Nutritional: Goal: Maintenance of  adequate nutrition will improve Outcome: Progressing Goal: Progress toward achieving an optimal weight will improve Outcome: Progressing   Problem: Activity: Goal: Ability to return to baseline activity level will improve Outcome: Progressing   Problem: Cardiovascular: Goal: Ability to achieve and maintain adequate cardiovascular perfusion will improve Outcome: Progressing Goal: Vascular access site(s) Level 0-1 will be maintained Outcome: Progressing   Problem: Cardiac: Goal: Ability to maintain an adequate cardiac output will improve Outcome: Progressing

## 2023-12-20 NOTE — TOC Progression Note (Signed)
 Transition of Care [facility]) - Progression Note    Patient Details  Name: Manuel [name] MRN: [id number] Date of Birth: 1945/08/06  Transition of Care [facility]) CM/SW Contact  [place] L Kmari Brian, [place] Phone Number: 12/20/2023, 2:46 PM  Clinical Narrative:     CSW received secure chat regarding nocturnal oxygen. DME ordered through [facility]. Patient not expected to discharge until Wednesday.     Barriers to Discharge: Continued Medical Work up               Expected Discharge Plan and Services       Living arrangements for the past 2 months: Single Family Home                                       Social Drivers of Health (SDOH) Interventions SDOH Screenings   Food Insecurity: No Food Insecurity (12/05/2023)  Housing: Low Risk  (12/05/2023)  Transportation Needs: No Transportation Needs (12/05/2023)  Utilities: Not At Risk (12/05/2023)  Depression (PHQ2-9): High Risk (09/11/2023)  Financial Resource Strain: Low Risk  (07/11/2023)  Social Connections: Moderately Isolated (12/05/2023)  Tobacco Use: Low Risk  (12/11/2023)    Readmission Risk Interventions    12/05/2023    3:09 PM 07/09/2023    8:30 PM 02/18/2023   11:58 AM  Readmission Risk Prevention Plan  Transportation Screening Complete Complete   PCP or Specialist Appt within 3-5 Days   Complete  HRI or Home Care Consult   Complete  Social Work Consult for Recovery Care Planning/Counseling   Complete  Palliative Care Screening   Not Applicable  Medication Review Oceanographer) Complete Complete Complete  PCP or Specialist appointment within 3-5 days of discharge Complete    HRI or Home Care Consult  Complete   SW Recovery Care/Counseling Consult Complete Complete   Palliative Care Screening Not Applicable Not Applicable   Skilled Nursing Facility Not Applicable Not Applicable

## 2023-12-20 NOTE — Progress Notes (Signed)
 Subjective  -   No complaints this morning   Physical Exam:  Wound VAC in good place with seal       Assessment/Plan:    Plan for wound VAC change in the operating room on Tuesday  Manuel [name] 12/20/2023 12:42 PM --  Vitals:   12/20/23 0408 12/20/23 0758  BP: 114/70 (!) 144/73  Pulse: (!) 51 66  Resp: 18 20  Temp: 97.7 F (36.5 C)   SpO2: 95% 93%    Intake/Output Summary (Last 24 hours) at 12/20/2023 1242 Last data filed at 12/20/2023 1100 Gross per 24 hour  Intake 0 ml  Output 1850 ml  Net -1850 ml     Laboratory CBC    Component Value Date/Time   WBC 6.2 12/20/2023 0541   HGB 8.8 (L) 12/20/2023 0541   HGB 15.7 02/16/2012 2013   HCT 27.4 (L) 12/20/2023 0541   HCT 44.4 02/16/2012 2013   PLT 220 12/20/2023 0541   PLT 176 02/16/2012 2013    BMET    Component Value Date/Time   NA 135 12/20/2023 0541   NA 137 07/21/2023 1246   NA 137 02/16/2012 2013   K 4.5 12/20/2023 0541   K 3.8 02/16/2012 2013   CL 108 12/20/2023 0541   CL 102 02/16/2012 2013   CO2 22 12/20/2023 0541   CO2 25 02/16/2012 2013   GLUCOSE 79 12/20/2023 0541   GLUCOSE 154 (H) 02/16/2012 2013   BUN 31 (H) 12/20/2023 0541   BUN 30 (H) 07/21/2023 1246   BUN 10 02/16/2012 2013   CREATININE 0.78 12/20/2023 0541   CREATININE 0.72 02/16/2012 2013   CALCIUM  8.5 (L) 12/20/2023 0541   CALCIUM  9.6 02/16/2012 2013   GFRNONAA >60 12/20/2023 0541   GFRNONAA >60 02/16/2012 2013   GFRAA >60 12/09/2018 0929   GFRAA >60 02/16/2012 2013    COAG Lab Results  Component Value Date   INR 1.3 (H) 12/12/2023   INR 1.3 (H) 12/04/2023   INR 1.4 (H) 07/10/2023   No results found for: PTT  Antibiotics Anti-infectives (From admission, onward)    Start     Dose/Rate Route Frequency Ordered Stop   12/15/23 1400  ceFEPIme  (MAXIPIME ) 2 g in sodium chloride  0.9 % 100 mL IVPB        2 g 200 mL/hr over 30 Minutes Intravenous Every 8 hours 12/15/23 1254     12/13/23 1500  ceFEPIme   (MAXIPIME ) 2 g in sodium chloride  0.9 % 100 mL IVPB  Status:  Discontinued        2 g 200 mL/hr over 30 Minutes Intravenous Every 12 hours 12/13/23 1413 12/15/23 1254   12/12/23 0536  ceFAZolin  (ANCEF ) IVPB 2g/100 mL premix  Status:  Discontinued        2 g 200 mL/hr over 30 Minutes Intravenous 30 min pre-op 12/12/23 0536 12/12/23 0703   12/11/23 1709  ceFAZolin  (ANCEF ) IVPB 2g/100 mL premix  Status:  Discontinued        2 g 200 mL/hr over 30 Minutes Intravenous 30 min pre-op 12/11/23 1709 12/11/23 1826   12/11/23 1556  vancomycin  (VANCOCIN ) powder  Status:  Discontinued          As needed 12/11/23 1556 12/11/23 1718   12/11/23 1555  gentamicin  (GARAMYCIN ) injection  Status:  Discontinued          As needed 12/11/23 1556 12/11/23 1718   12/11/23 1553  ceFEPIme  (MAXIPIME ) injection  Status:  Discontinued  As needed 12/11/23 1554 12/11/23 1718   12/11/23 1430  ceFEPIme  (MAXIPIME ) 2 g in sodium chloride  0.9 % 100 mL IVPB  Status:  Discontinued       Note to Pharmacy: Just powder please  MD to mix in OR   2 g 200 mL/hr over 30 Minutes Intravenous  Once 12/11/23 1420 12/11/23 1425   12/10/23 0900  ceFEPIme  (MAXIPIME ) 2 g in sodium chloride  0.9 % 100 mL IVPB  Status:  Discontinued        2 g 200 mL/hr over 30 Minutes Intravenous Every 8 hours 12/10/23 0720 12/13/23 1413   12/09/23 0900  ceFEPIme  (MAXIPIME ) 2 g in sodium chloride  0.9 % 100 mL IVPB  Status:  Discontinued        2 g 200 mL/hr over 30 Minutes Intravenous Every 12 hours 12/09/23 0758 12/10/23 0720   12/07/23 1800  cefTRIAXone  (ROCEPHIN ) 2 g in sodium chloride  0.9 % 100 mL IVPB  Status:  Discontinued        2 g 200 mL/hr over 30 Minutes Intravenous Every 24 hours 12/07/23 1419 12/09/23 0758   12/06/23 0300  vancomycin  (VANCOREADY) IVPB 1250 mg/250 mL  Status:  Discontinued        1,250 mg 166.7 mL/hr over 90 Minutes Intravenous Every 24 hours 12/05/23 0103 12/05/23 0713   12/05/23 0145  ceFEPIme  (MAXIPIME ) 2 g in sodium  chloride 0.9 % 100 mL IVPB  Status:  Discontinued        2 g 200 mL/hr over 30 Minutes Intravenous Every 12 hours 12/05/23 0049 12/07/23 1419   12/05/23 0145  vancomycin  (VANCOREADY) IVPB 2000 mg/400 mL        2,000 mg 200 mL/hr over 120 Minutes Intravenous  Once 12/05/23 0049 12/05/23 0400   12/05/23 0100  azithromycin  (ZITHROMAX ) 500 mg in sodium chloride  0.9 % 250 mL IVPB  Status:  Discontinued        500 mg 250 mL/hr over 60 Minutes Intravenous Every 24 hours 12/05/23 0003 12/05/23 0713   12/05/23 0000  ceFAZolin  (ANCEF ) IVPB 1 g/50 mL premix  Status:  Discontinued       Note to Pharmacy: Send with pt to OR   1 g 100 mL/hr over 30 Minutes Intravenous On call 12/04/23 1814 12/04/23 1922   12/04/23 2030  ceFAZolin  (ANCEF ) IVPB 2g/100 mL premix  Status:  Discontinued        2 g 200 mL/hr over 30 Minutes Intravenous  Once 12/04/23 2025 12/04/23 2122   12/04/23 1911  ceFAZolin  (ANCEF ) IVPB 1 g/50 mL premix        over 30 Minutes  Continuous PRN 12/04/23 1923 12/04/23 2032        V. Malvina Serene CLORE, M.D., [facility] [place] Office: [phone] Pager:  [phone]

## 2023-12-20 NOTE — Progress Notes (Signed)
 PT Cancellation Note  Patient Details Name: Manuel [name] MRN: [id number] DOB: Nov 15, 1945   Cancelled Treatment:     PT attempt. Pt's spouse to bring his prosthesis prior to noon. Author will return at 1pm for PT session.    [name] 12/20/2023, 10:55 AM

## 2023-12-20 NOTE — Progress Notes (Signed)
 Physical Therapy Treatment Patient Details Name: Manuel [name] MRN: [id number] DOB: October 23, 1945 Today's Date: 12/20/2023   History of Present Illness Patient is a 78 year old male presenting after fall while getting out of bed to wheelchair.  Found to have right femoral a rupture, vascular consulted and performed repair. Complicated by Hemorrhagic shock requiring ICU and pressors. PMH: CHF, CAD, PAD, R BKA, HTN, gout    PT Comments  Pt was long sitting in bed upon arrival. He was on 2 L o2 and did not want to attempt wean. Pt agrees to session but is easily distracted and very talkative. Requires constant redirecting to stay focused on desired task however when focused pt does well. Prosthesis was delivered this morning. Pt is able to wear R LE [prosthesis however he endorses not wearing very frequently prior to admission. Pt was encouraged to start wearing stump shrinker at all times when not wearing prosthesis. Pt was able to apply prosthetic while seated EOB however unable to wear with soft inner socket liner. Pt was able to wear silicone liner with + 2 socks. He endorses need to attempt to have BM and did sit on BSC but unsuccessful with BM attempt. Session progressed to pt ambulating ~ 12 ft with crutches. He needs more assistance to achieve standing however once in standing did well with crutches. Phe does remain a high fall risk and does endorse very limited activity prior to admission. Acute PT will continue to follow and progress as able per current POC. DC recs remain appropriate however pt reports wanting to DC directly home with HHPT to follow. Author encouraged pt to consider CIR level fo care to maximize his independence and safety with all ADLs prior to returning home.      If plan is discharge home, recommend the following: A little help with walking and/or transfers;A little help with bathing/dressing/bathroom;Assist for transportation;Help with stairs or ramp for entrance;Assistance  with cooking/housework     Equipment Recommendations  None recommended by PT       Precautions / Restrictions Precautions Precautions: Fall Recall of Precautions/Restrictions: Intact Precaution/Restrictions Comments: wound vac right groin Restrictions Weight Bearing Restrictions Per Provider Order: No     Mobility  Bed Mobility Overal bed mobility: Needs Assistance Bed Mobility: Supine to Sit  Supine to sit: Min assist, HOB elevated General bed mobility comments: min assist to achieve EOB short sitting. Pt does use bedrail in addition to RUE HHA. Pt was able to apply prosthesis while seated EOB however due to limb swelling was unable to wear inner soft socket liner. Pt was able to wear silicone liner with pin + 1 ply sock + 5ply sock    Transfers Overall transfer level: Needs assistance Equipment used: Rolling walker (2 wheels), Crutches Transfers: Sit to/from Stand, Bed to chair/wheelchair/BSC Sit to Stand: Contact guard assist Stand pivot transfers: Contact guard assist  General transfer comment: pt stood 3 x total during session 1 x EOB to RW, then 2 x STS to crutches. The crutches are easier for me. Autho encouraged use of RW > crutches however once in standing pt did well with ambulation with crutches. Pt struggles with crtches to accually achieve standing safely    Ambulation/Gait Ambulation/Gait assistance: Contact guard assist Gait Distance (Feet): 12 Feet Assistive device: Crutches Gait Pattern/deviations: Step-through pattern Gait velocity: dcreased  General Gait Details: Pt was able to ambulate ~ 12 ft with crutches. Author provided management of wound vac cord and puewick    Balance Overall  balance assessment: Needs assistance Sitting-balance support: Feet unsupported, No upper extremity supported Sitting balance-Leahy Scale: Good     Standing balance support: Bilateral upper extremity supported, During functional activity, Reliant on assistive device for  balance Standing balance-Leahy Scale: Fair       Hotel manager: No apparent difficulties  Cognition Arousal: Alert Behavior During Therapy: WFL for tasks assessed/performed   PT - Cognitive impairments: No apparent impairments    PT - Cognition Comments: EXTEMEMLY talkative. needs constant re-directing to stay focus on desired task requested of him. Following commands: Intact      Cueing Cueing Techniques: Verbal cues, Tactile cues         Pertinent Vitals/Pain Pain Assessment Pain Assessment: No/denies pain Pain Score: 0-No pain     PT Goals (current goals can now be found in the care plan section) Acute Rehab PT Goals Patient Stated Goal: to go home and let my wife do the antibiotics not rehab Progress towards PT goals: Progressing toward goals    Frequency    Min 2X/week           Co-evaluation     PT goals addressed during session: Mobility/safety with mobility        AM-PAC PT 6 Clicks Mobility   Outcome Measure  Help needed turning from your back to your side while in a flat bed without using bedrails?: A Little Help needed moving from lying on your back to sitting on the side of a flat bed without using bedrails?: A Little Help needed moving to and from a bed to a chair (including a wheelchair)?: A Little Help needed standing up from a chair using your arms (e.g., wheelchair or bedside chair)?: A Little Help needed to walk in hospital room?: A Little Help needed climbing 3-5 steps with a railing? : A Lot 6 Click Score: 17    End of Session Equipment Utilized During Treatment: Oxygen (pt remained on O2 throughout session and was unwilling to ttempt wean due to feeling SOB.) Activity Tolerance: Patient tolerated treatment well;Patient limited by fatigue Patient left: in bed;with call bell/phone within reach;with bed alarm set;with nursing/sitter in room Nurse Communication: Mobility status PT Visit Diagnosis:  Difficulty in walking, not elsewhere classified (R26.2);Muscle weakness (generalized) (M62.81)     Time: 8685-8587 PT Time Calculation (min) (ACUTE ONLY): 58 min  Charges:    $Gait Training: 8-22 mins $Therapeutic Activity: 23-37 mins PT General Charges $$ ACUTE PT VISIT: 1 Visit                    Rankin Essex PTA 12/20/23, 3:15 PM

## 2023-12-20 NOTE — Progress Notes (Signed)
 Progress Note   Patient: Manuel [name] FMW:[id number] DOB: Feb 17, 1946 DOA: 12/04/2023     16 DOS: the patient was seen and examined on 12/20/2023   Brief hospital course: 78 y.o. 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 Patient fell with walker landing on top of him.  EMS was called for assistance.  Patient refused to be transported to ED for evaluation initial. However as time when on patient note more intense pain in right groin area and due to his EMS was called back and patient was transported to ED.      ED Course:  99.3/tmx 100.7 BP 131/72, hr 113, rr 19 sat 95%  UA :rare bacteria , ketones 20 ,  Wbc 11.6, hgb 14.3, plt 191, increase pmn Na 126 ( 137), K 5.3, cl91, glu 438, cr 1.12 Lactic 2.9 ,3.3 Trop 284,3403, 8119 EKG: sinus tachycardia , RAD twave change inferior lateral leads   Patient on evaluation found to have right femoral a rupture, vascular consulted and performed repair.     Patient course was further complicated by NTSEMI , s/p right femoral a repair patient was started on heparin  drip . Cardiology was consulted recommend medical management.    PCCM was involved for comanagement. TRH was consulted for admission and further management as below  8/27.  Patient to go to the operating room for first wound VAC change. 8/28.  Patient can transfer out of the ICU.  Vascular team plans on taking back to the OR on Tuesday for wound VAC change. 8/29.  Patient and wife interested in going home after hospitalization.  Will need IV antibiotics upon discharge.  Nonsustained ventricular tachycardia 8 beats.  Patient given IV magnesium . 8/30.  Patient qualifies for  nocturnal oxygen.   Assessment and Plan: * Rupture of femoral artery (HCC) 8/14.  Patient had percutaneous transluminal angioplasty and stent placement of the right profunda femoris and common femoral artery extending into the distal right external iliac artery. 8/21.  Right external iliac artery to profunda femoris artery bypass with autologous ipsilateral superficial femoral artery, resection of infected right, femoral artery and removal of common femoral artery, profunda femoris artery and superficial femoral artery stents.  Harvest of right superficial femoral artery for bypass with endarterectomy of the right superficial femoral artery. 8/27.  Wound VAC changed in the operating room Will have another wound VAC change in the operating room on Tuesday  Severe sepsis (HCC) Secondary to Serratia.  Present on admission.  Currently on Maxipime  as per ID.  Will need total of 4 weeks of IV antibiotics with either Maxipime  or ertapenem .  Patient and wife want to do IV antibiotics at home rather than a subacute rehab.  Would be open to acute inpatient rehab though.  Hemorrhagic shock (HCC) Resolved.  Chronic systolic CHF (congestive heart failure) (HCC) Last EF 20%, continue Coreg  at lower dose, low-dose losartan , low-dose Aldactone  start tomorrow.  Uncontrolled type 2 diabetes mellitus with hyperglycemia, with long-term current use of insulin  (HCC) Continue Lantus  10 units at bedtime.  Continue sliding scale.  Patient with mild hyperglycemia and hypoglycemia.  NSTEMI (non-ST elevated myocardial infarction) (HCC) Secondary to hemorrhagic  shock.  Patient on aspirin , Plavix , Coreg , losartan  and statin.   Hyponatremia Last sodium normal range  PAD (peripheral artery disease) (HCC) Continue aspirin  and Plavix , history of right BKA.  AKI (acute kidney injury) (HCC) With metabolic acidosis.  Resolved.  NSVT (nonsustained ventricular tachycardia) (HCC) 8 beat run of nonsustained ventricular  tachycardia yesterday.  Patient given IV magnesium .  Case discussed with cardiology and no need for LifeVest at this point, last EF 20 to 25%.  Diarrhea Stopped 3 laxatives.  Stool studies negative.  Hyperkalemia Resolved  Sleep apnea Will need outpatient sleep study.  With nocturnal hypoxia with overnight oximetry he qualifies for nocturnal oxygen.  History of alcohol  abuse On thiamine   Iron  deficiency anemia Last hemoglobin 8.8.  Likely lower with IV fluids given yesterday.  Received 3 units of blood during the hospital course.        Subjective: Patient states he feels better.  He does qualify for nocturnal oxygen with overnight sleep study  Physical Exam: Vitals:   12/19/23 1511 12/19/23 2026 12/20/23 0408 12/20/23 0758  BP: (!) 104/55 122/63 114/70 (!) 144/73  Pulse: (!) 55 (!) 50 (!) 51 66  Resp: 16 20 18 20   Temp: 97.8 F (36.6 C) 97.8 F (36.6 C) 97.7 F (36.5 C)   TempSrc:  Oral Oral   SpO2: 98% (!) 89% 95% 93%  Weight:      Height:       Physical Exam HENT:     Head: Normocephalic.     Mouth/Throat:     Pharynx: No oropharyngeal exudate.  Eyes:     General: Lids are normal.     Conjunctiva/sclera: Conjunctivae normal.  Cardiovascular:     Rate and Rhythm: Normal rate and regular rhythm.     Heart sounds: Normal heart sounds, S1 normal and S2 normal.  Pulmonary:     Breath sounds: No decreased breath sounds, wheezing, rhonchi or rales.  Abdominal:     Palpations: Abdomen is soft.     Tenderness: There is no abdominal tenderness.  Musculoskeletal:     Right Lower Extremity: Right leg is amputated below knee.  Skin:    General: Skin is warm.     Comments: Wound VAC right groin.  Neurological:     Mental Status: He is alert and oriented to person, place, and time.     Data Reviewed: Creatinine 0.78, electrolytes normal range, hemoglobin 8.8  Disposition: Status is: Inpatient Remains inpatient appropriate because: Wound VAC change in OR on  Tuesday  Planned Discharge Destination: Home with home health versus acute inpatient rehab    Time spent: 27 minutes  Author: Charlie Patterson, MD 12/20/2023 2:46 PM  For on call review [email or web address].

## 2023-12-20 NOTE — Progress Notes (Signed)

## 2023-12-20 NOTE — Assessment & Plan Note (Signed)
 8 beat run of nonsustained ventricular tachycardia yesterday.  Patient given IV magnesium .  Case discussed with cardiology and no need for LifeVest at this point, last EF 20 to 25%.

## 2023-12-20 NOTE — Progress Notes (Signed)
 Inpatient Rehab Admissions Coordinator:    I spoke with Pt.'s wife to discuss potential CIR admit. Wife is interested. I will try to reach Pt. To speak with him after he works with PT.  Leita Kleine, MS, CCC-SLP Rehab Admissions Coordinator  [phone] (celll) [phone] (office)

## 2023-12-21 DIAGNOSIS — I5022 Chronic systolic (congestive) heart failure: Secondary | ICD-10-CM | POA: Diagnosis not present

## 2023-12-21 DIAGNOSIS — I772 Rupture of artery: Secondary | ICD-10-CM | POA: Diagnosis not present

## 2023-12-21 DIAGNOSIS — A419 Sepsis, unspecified organism: Secondary | ICD-10-CM | POA: Diagnosis not present

## 2023-12-21 DIAGNOSIS — R578 Other shock: Secondary | ICD-10-CM | POA: Diagnosis not present

## 2023-12-21 LAB — GLUCOSE, CAPILLARY
Glucose-Capillary: 103 mg/dL — ABNORMAL HIGH (ref 70–99)
Glucose-Capillary: 123 mg/dL — ABNORMAL HIGH (ref 70–99)
Glucose-Capillary: 163 mg/dL — ABNORMAL HIGH (ref 70–99)
Glucose-Capillary: 80 mg/dL (ref 70–99)
Glucose-Capillary: 98 mg/dL (ref 70–99)

## 2023-12-21 NOTE — Progress Notes (Signed)
 Inpatient Rehab Admissions Coordinator:    I spoke with Pt. Regarding Cir admit. He states that he prefers to go home and do [facility]. His wife is in agreement with this plan. I will sign off.   Leita Kleine, MS, CCC-SLP Rehab Admissions Coordinator  [phone] (celll) [phone] (office)

## 2023-12-21 NOTE — Plan of Care (Signed)
 Problem: Education: Goal: Knowledge of General Education information will improve Description: Including pain rating scale, medication(s)/side effects and non-pharmacologic comfort measures Outcome: Progressing   Problem: Health Behavior/Discharge Planning: Goal: Ability to manage health-related needs will improve Outcome: Progressing   Problem: Clinical Measurements: Goal: Ability to maintain clinical measurements within normal limits will improve Outcome: Progressing Goal: Will remain free from infection Outcome: Progressing Goal: Diagnostic test results will improve Outcome: Progressing Goal: Respiratory complications will improve Outcome: Progressing Goal: Cardiovascular complication will be avoided Outcome: Progressing   Problem: Activity: Goal: Risk for activity intolerance will decrease Outcome: Progressing   Problem: Nutrition: Goal: Adequate nutrition will be maintained Outcome: Progressing   Problem: Coping: Goal: Level of anxiety will decrease Outcome: Progressing   Problem: Elimination: Goal: Will not experience complications related to bowel motility Outcome: Progressing Goal: Will not experience complications related to urinary retention Outcome: Progressing   Problem: Pain Managment: Goal: General experience of comfort will improve and/or be controlled Outcome: Progressing   Problem: Safety: Goal: Ability to remain free from injury will improve Outcome: Progressing   Problem: Skin Integrity: Goal: Risk for impaired skin integrity will decrease Outcome: Progressing   Problem: Education: Goal: Ability to describe self-care measures that may prevent or decrease complications (Diabetes Survival Skills Education) will improve Outcome: Progressing Goal: Individualized Educational Video(s) Outcome: Progressing   Problem: Coping: Goal: Ability to adjust to condition or change in health will improve Outcome: Progressing   Problem: Fluid  Volume: Goal: Ability to maintain a balanced intake and output will improve Outcome: Progressing   Problem: Health Behavior/Discharge Planning: Goal: Ability to identify and utilize available resources and services will improve Outcome: Progressing Goal: Ability to manage health-related needs will improve Outcome: Progressing   Problem: Metabolic: Goal: Ability to maintain appropriate glucose levels will improve Outcome: Progressing   Problem: Nutritional: Goal: Maintenance of adequate nutrition will improve Outcome: Progressing Goal: Progress toward achieving an optimal weight will improve Outcome: Progressing   Problem: Skin Integrity: Goal: Risk for impaired skin integrity will decrease Outcome: Progressing   Problem: Tissue Perfusion: Goal: Adequacy of tissue perfusion will improve Outcome: Progressing   Problem: Education: Goal: Understanding of CV disease, CV risk reduction, and recovery process will improve Outcome: Progressing Goal: Individualized Educational Video(s) Outcome: Progressing   Problem: Activity: Goal: Ability to return to baseline activity level will improve Outcome: Progressing   Problem: Cardiovascular: Goal: Ability to achieve and maintain adequate cardiovascular perfusion will improve Outcome: Progressing Goal: Vascular access site(s) Level 0-1 will be maintained Outcome: Progressing   Problem: Health Behavior/Discharge Planning: Goal: Ability to safely manage health-related needs after discharge will improve Outcome: Progressing   Problem: Education: Goal: Ability to describe self-care measures that may prevent or decrease complications (Diabetes Survival Skills Education) will improve Outcome: Progressing Goal: Individualized Educational Video(s) Outcome: Progressing   Problem: Cardiac: Goal: Ability to maintain an adequate cardiac output will improve Outcome: Progressing   Problem: Health Behavior/Discharge Planning: Goal:  Ability to identify and utilize available resources and services will improve Outcome: Progressing Goal: Ability to manage health-related needs will improve Outcome: Progressing   Problem: Fluid Volume: Goal: Ability to achieve a balanced intake and output will improve Outcome: Progressing   Problem: Metabolic: Goal: Ability to maintain appropriate glucose levels will improve Outcome: Progressing   Problem: Nutritional: Goal: Maintenance of adequate nutrition will improve Outcome: Progressing Goal: Maintenance of adequate weight for body size and type will improve Outcome: Progressing   Problem: Respiratory: Goal: Will regain and/or maintain adequate ventilation Outcome:  Progressing   Problem: Urinary Elimination: Goal: Ability to achieve and maintain adequate renal perfusion and functioning will improve Outcome: Progressing

## 2023-12-21 NOTE — Progress Notes (Signed)
 Progress Note   Patient: Manuel [name] FMW:[id number] DOB: October 11, 1945 DOA: 12/04/2023     17 DOS: the patient was seen and examined on 12/21/2023   Brief hospital course: 78 y.o. 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 Patient fell with walker landing on top of him.  EMS was called for assistance.  Patient refused to be transported to ED for evaluation initial. However as time when on patient note more intense pain in right groin area and due to his EMS was called back and patient was transported to ED.      ED Course:  99.3/tmx 100.7 BP 131/72, hr 113, rr 19 sat 95%  UA :rare bacteria , ketones 20 ,  Wbc 11.6, hgb 14.3, plt 191, increase pmn Na 126 ( 137), K 5.3, cl91, glu 438, cr 1.12 Lactic 2.9 ,3.3 Trop 284,3403, 8119 EKG: sinus tachycardia , RAD twave change inferior lateral leads   Patient on evaluation found to have right femoral a rupture, vascular consulted and performed repair.     Patient course was further complicated by NTSEMI , s/p right femoral a repair patient was started on heparin  drip . Cardiology was consulted recommend medical management.    PCCM was involved for comanagement. TRH was consulted for admission and further management as below  8/27.  Patient to go to the operating room for first wound VAC change. 8/28.  Patient can transfer out of the ICU.  Vascular team plans on taking back to the OR on Tuesday for wound VAC change. 8/29.  Patient and wife interested in going home after hospitalization.  Will need IV antibiotics upon discharge.  Nonsustained ventricular tachycardia 8 beats.  Patient given IV magnesium . 8/30.  Patient qualifies for  nocturnal oxygen. 8/31.  Patient wants to go home rather than acute inpatient rehab.   Assessment and Plan: * Rupture of femoral artery (HCC) 8/14.  Patient had percutaneous transluminal angioplasty and stent placement of the right profunda femoris and common femoral artery extending into the distal right external iliac artery. 8/21.  Right external iliac artery to profunda femoris artery bypass with autologous ipsilateral superficial femoral artery, resection of infected right, femoral artery and removal of common femoral artery, profunda femoris artery and superficial femoral artery stents.  Harvest of right superficial femoral artery for bypass with endarterectomy of the right superficial femoral artery. 8/27.  Wound VAC changed in the operating room Will have another wound VAC change in the operating room on Tuesday  Severe sepsis (HCC) Secondary to Serratia.  Present on admission.  Currently on Maxipime  as per ID.  Will need total of 4 weeks of IV antibiotics with either Maxipime  or ertapenem .  Patient and wife want to do IV antibiotics at home rather than a subacute rehab.  Would be open to acute inpatient rehab though.  Hemorrhagic shock (HCC) Resolved.  Chronic systolic CHF (congestive heart failure) (HCC) Last EF 20%, continue Coreg  at lower dose, low-dose losartan , low-dose Aldactone  start tomorrow.  Uncontrolled type 2 diabetes mellitus with hyperglycemia, with long-term current use of insulin  (HCC) Continue Lantus  10 units at bedtime.  Continue sliding scale.  Patient with mild hyperglycemia  and hypoglycemia.  NSTEMI (non-ST elevated myocardial infarction) (HCC) Secondary to hemorrhagic shock.  Patient on aspirin , Plavix , Coreg , losartan  and statin.   Hyponatremia Last sodium normal range  PAD (peripheral artery disease) (HCC) Continue aspirin  and Plavix , history of right BKA.  AKI (acute kidney injury) (HCC) With metabolic acidosis.  Resolved.  NSVT (nonsustained  ventricular tachycardia) (HCC) 8 beat run of nonsustained ventricular tachycardia yesterday.  Patient given IV magnesium .  Case discussed with cardiology and no need for LifeVest at this point, last EF 20 to 25%.  Diarrhea Stopped 3 laxatives.  Stool studies negative.  Hyperkalemia Resolved  Sleep apnea Will need outpatient sleep study.  With nocturnal hypoxia with overnight oximetry he qualifies for nocturnal oxygen.  History of alcohol  abuse On thiamine   Iron  deficiency anemia Last hemoglobin 8.8.  Likely lower with IV fluids given yesterday.  Received 3 units of blood during the hospital course.        Subjective: Patient feeling better.  Offers no complaints.  Diarrhea has settled down.  Initially admitted 16 days ago with hemorrhagic shock.  Physical Exam: Vitals:   12/21/23 0000 12/21/23 0400 12/21/23 0949 12/21/23 1004  BP: (!) 150/80 (!) 146/77 (!) 154/77 128/68  Pulse: 60 (!) 57 72 (!) 58  Resp: 17 16 20 20   Temp: 98 F (36.7 C) 97.7 F (36.5 C) 98 F (36.7 C) 97.8 F (36.6 C)  TempSrc: Oral Oral Oral   SpO2: 98% 98% 94% 97%  Weight:      Height:       Physical Exam HENT:     Head: Normocephalic.     Mouth/Throat:     Pharynx: No oropharyngeal exudate.  Eyes:     General: Lids are normal.     Conjunctiva/sclera: Conjunctivae normal.  Cardiovascular:     Rate and Rhythm: Normal rate and regular rhythm.     Heart sounds: Normal heart sounds, S1 normal and S2 normal.  Pulmonary:     Breath sounds: No decreased breath sounds, wheezing, rhonchi or rales.  Abdominal:     Palpations: Abdomen is soft.     Tenderness: There is no abdominal tenderness.  Musculoskeletal:     Right Lower Extremity: Right leg is amputated below knee.  Skin:    General: Skin is warm.     Comments: Wound VAC right groin.  Neurological:     Mental Status: He is alert and oriented to person, place, and time.     Data Reviewed: Creatinine 0.78, hemoglobin  8.8  Disposition: Status is: Inpatient Remains inpatient appropriate because: Vascular surgery to look up the right groin wound in the operating room and change the wound VAC.  Planned Discharge Destination: Home with Home Health    Time spent: 28 minutes  Author: Charlie Patterson, MD 12/21/2023 1:43 PM  For on call review [email or web address].

## 2023-12-21 NOTE — Progress Notes (Signed)
 Occupational Therapy Treatment Patient Details Name: Manuel [name] MRN: [id number] DOB: 11-21-1945 Today's Date: 12/21/2023   History of present illness Patient is a 78 year old male presenting after fall while getting out of bed to wheelchair.  Found to have right femoral a rupture, vascular consulted and performed repair. Complicated by Hemorrhagic shock requiring ICU and pressors. PMH: CHF, CAD, PAD, R BKA, HTN, gout   OT comments  Patient in supine in bed upon arrival.  Patient willing to work with OT.  Patient report wife has a cold and cannot come and visit.  But will be able to assist patient when home.  Patient reports as he declined in strength with being in the hospital.  Addressed with patient range of motion in left lower extremity and performing ADLs donning and doffing sock in supine.  While wound VAC still in right groin.  Also reviewed with patient's supine to sit.  Patient needed min assist to mod verbal cueing with first attempt.  Educated patient and reviewed with him 3 times rolling to the left sitting up independently.  Patient was able to don prosthesis with sleeve and 1 sock.  Needed min assist for sit to stand and patient was simulating set up at home with bedside commode and crutches.  Was able to ambulate with min assist to contact-guard with crutches 3 steps away from bed, parallel with the bed and back.  Would recommend continues OT services to increase upper body range of motion strength as well as lower body ADLs and functional mobility in ADLs and toilet transfers.      If plan is discharge home, recommend the following:  A lot of help with walking and/or transfers;A lot of help with bathing/dressing/bathroom   Equipment Recommendations  None recommended by OT    Recommendations for Other Services      Precautions / Restrictions Precautions Precautions: Fall Recall of Precautions/Restrictions: Intact Precaution/Restrictions Comments: wound vac right  groin Restrictions Weight Bearing Restrictions Per Provider Order: No       Mobility Bed Mobility                    Transfers                         Balance                                           ADL either performed or assessed with clinical judgement   ADL                                              Extremity/Trunk Assessment Upper Extremity Assessment Upper Extremity Assessment: Generalized weakness;Overall WFL for tasks assessed            Vision Baseline Vision/History: 1 Wears glasses     Perception     Praxis     Communication     Cognition Arousal: Alert Behavior During Therapy: WFL for tasks assessed/performed           Attention impairment (select first level of impairment): Sustained attention  Cueing      Exercises Other Exercises Other Exercises: Patient in supine.  Independent in doffing sock with some effort.  Needs min assist to don left sock in supine. Other Exercises: Supine to sit patient need mod verbal cueing to roll on left side and push-up.  First trial patient needed min assist.  Repeated 2 times patient needed with supervision.  Recommended for patient to roll over to the left and push-up to set up at edge of bed. Other Exercises: Upon entering patient had he shrinker on right below-knee stump.  Appeared edema decreasing. Other Exercises: Reviewed with patient donning of prosthesis with device and sock.  Patient distractible and very conversational but are able to follow directions and focus on task. Other Exercises: Patient was able to sit and stand with a set up simulating he uses at home with bedside commode and crutches.  Min assist for sit and stand edge of bed was able to ambulate 3 steps 10 3 steps parallel to the bed and 3 steps back to the bed.  Needed moderate verbal cueing for hand placement and use of crutches.    Shoulder  Instructions       General Comments      Pertinent Vitals/ Pain       Pain Assessment Pain Score:  (Tenderness in prosthesis with weightbearing)  Home Living Family/patient expects to be discharged to:: Private residence Living Arrangements: Spouse/significant other Available Help at Discharge: Family;Available 24 hours/day Type of Home: House Home Access: Ramped entrance     Home Layout: Able to live on main level with bedroom/bathroom;Two level;Laundry or work area in basement   Alternate Level Stairs-Rails: Right;Left Bathroom Shower/Tub: Chief Strategy Officer: Handicapped height Bathroom Accessibility: Yes   Home Equipment: Agricultural consultant (2 wheels);BSC/3in1;Wheelchair - manual;Cane - single point;Tub bench;Crutches   Additional Comments: He has his prosthesis since yesterday      Prior Functioning/Environment              Frequency  Min 3X/week        Progress Toward Goals  OT Goals(current goals can now be found in the care plan section)  Progress towards OT goals: Progressing toward goals  Acute Rehab OT Goals OT Goal Formulation: With patient/family Time For Goal Achievement: 12/29/23 Potential to Achieve Goals: Good  Plan      Co-evaluation                 AM-PAC OT 6 Clicks Daily Activity     Outcome Measure                    End of Session Equipment Utilized During Treatment: Oxygen      Activity Tolerance Patient tolerated treatment well   Patient Left in bed   Nurse Communication          Time: 8891-8853 OT Time Calculation (min): 38 min  Charges: OT General Charges $OT Visit: 1 Visit OT Treatments $Self Care/Home Management : 23-37 mins $Therapeutic Activity: 8-22 mins    Britt Theard OTR/L,CLT 12/21/2023, 1:42 PM

## 2023-12-22 DIAGNOSIS — I772 Rupture of artery: Secondary | ICD-10-CM | POA: Diagnosis not present

## 2023-12-22 DIAGNOSIS — T827XXS Infection and inflammatory reaction due to other cardiac and vascular devices, implants and grafts, sequela: Secondary | ICD-10-CM | POA: Diagnosis not present

## 2023-12-22 DIAGNOSIS — A419 Sepsis, unspecified organism: Secondary | ICD-10-CM | POA: Diagnosis not present

## 2023-12-22 DIAGNOSIS — A498 Other bacterial infections of unspecified site: Secondary | ICD-10-CM | POA: Diagnosis not present

## 2023-12-22 LAB — BASIC METABOLIC PANEL WITH GFR
Anion gap: 4 — ABNORMAL LOW (ref 5–15)
BUN: 23 mg/dL (ref 8–23)
CO2: 24 mmol/L (ref 22–32)
Calcium: 8.5 mg/dL — ABNORMAL LOW (ref 8.9–10.3)
Chloride: 106 mmol/L (ref 98–111)
Creatinine, Ser: 0.77 mg/dL (ref 0.61–1.24)
GFR, Estimated: >60 mL/min (ref >60)
Glucose, Bld: 135 mg/dL — ABNORMAL HIGH (ref 70–99)
Potassium: 4.6 mmol/L (ref 3.5–5.1)
Sodium: 134 mmol/L — ABNORMAL LOW (ref 135–145)

## 2023-12-22 LAB — CBC
HCT: 28.6 % — ABNORMAL LOW (ref 39.0–52.0)
Hemoglobin: 9.3 g/dL — ABNORMAL LOW (ref 13.0–17.0)
MCH: 29.7 pg (ref 26.0–34.0)
MCHC: 32.5 g/dL (ref 30.0–36.0)
MCV: 91.4 fL (ref 80.0–100.0)
Platelets: 217 K/uL (ref 150–400)
RBC: 3.13 MIL/uL — ABNORMAL LOW (ref 4.22–5.81)
RDW: 15.9 % — ABNORMAL HIGH (ref 11.5–15.5)
WBC: 6.9 K/uL (ref 4.0–10.5)
nRBC: 0 % (ref 0.0–0.2)

## 2023-12-22 LAB — GLUCOSE, CAPILLARY
Glucose-Capillary: 101 mg/dL — ABNORMAL HIGH (ref 70–99)
Glucose-Capillary: 153 mg/dL — ABNORMAL HIGH (ref 70–99)
Glucose-Capillary: 126 mg/dL — ABNORMAL HIGH (ref 70–99)
Glucose-Capillary: 135 mg/dL — ABNORMAL HIGH (ref 70–99)
Glucose-Capillary: 177 mg/dL — ABNORMAL HIGH (ref 70–99)
Glucose-Capillary: 118 mg/dL — ABNORMAL HIGH (ref 70–99)
Glucose-Capillary: 142 mg/dL — ABNORMAL HIGH (ref 70–99)

## 2023-12-22 MED ORDER — POLYETHYLENE GLYCOL 3350 17 G PO PACK
17.0000 g | PACK | Freq: Every day | ORAL | Status: DC
  Administered 2023-12-22 – 2023-12-29 (×6): 17 g via ORAL
  Filled 2023-12-22 (×7): qty 1

## 2023-12-22 MED ORDER — INSULIN GLARGINE 100 UNIT/ML ~~LOC~~ SOLN
5.0000 [IU] | Freq: Every day | SUBCUTANEOUS | Status: DC
  Administered 2023-12-22 – 2023-12-31 (×9): 5 [IU] via SUBCUTANEOUS
  Filled 2023-12-22 (×11): qty 0.05

## 2023-12-22 MED ORDER — SODIUM CHLORIDE 0.9% FLUSH: 10.0000 mL | INTRAVENOUS | Status: DC | PRN

## 2023-12-22 MED ORDER — CHLORHEXIDINE GLUCONATE CLOTH 2 % EX PADS
6.0000 | MEDICATED_PAD | Freq: Every day | CUTANEOUS | Status: DC
  Administered 2023-12-23 – 2024-01-01 (×9): 6 via TOPICAL

## 2023-12-22 MED ORDER — SODIUM CHLORIDE 0.9% FLUSH
10.0000 mL | Freq: Two times a day (BID) | INTRAVENOUS | Status: DC
  Administered 2023-12-22 – 2024-01-01 (×16): 10 mL

## 2023-12-22 MED ORDER — SPIRONOLACTONE 25 MG PO TABS
25.0000 mg | ORAL_TABLET | Freq: Every day | ORAL | Status: DC
  Administered 2023-12-23 – 2024-01-01 (×8): 25 mg via ORAL
  Filled 2023-12-22 (×9): qty 1

## 2023-12-22 NOTE — H&P (View-Only) (Signed)
 Plan for wound vac change tomorrow NPO after midnight D/w patient and he agrees  [employer]

## 2023-12-22 NOTE — Progress Notes (Signed)
 Plan for wound vac change tomorrow NPO after midnight D/w patient and he agrees  [employer]

## 2023-12-22 NOTE — Progress Notes (Signed)
 Peripherally Inserted Central Catheter Placement  The IV Nurse has discussed with the patient and/or persons authorized to consent for the patient, the purpose of this procedure and the potential benefits and risks involved with this procedure.  The benefits include less needle sticks, lab draws from the catheter, and the patient may be discharged home with the catheter. Risks include, but not limited to, infection, bleeding, blood clot (thrombus formation), and puncture of an artery; nerve damage and irregular heartbeat and possibility to perform a PICC exchange if needed/ordered by physician.  Alternatives to this procedure were also discussed.  Bard Power PICC patient education guide, fact sheet on infection prevention and patient information card has been provided to patient /or left at bedside.    PICC Placement Documentation  PICC Single Lumen 12/22/23 Right Basilic 38 cm 0 cm (Active)  Indication for Insertion or Continuance of Line Prolonged intravenous therapies 12/22/23 2220  Exposed Catheter (cm) 0 cm 12/22/23 2220  Site Assessment Clean, Dry, Intact 12/22/23 2220  Line Status Flushed;Blood return noted;Saline locked 12/22/23 2220  Dressing Type Transparent;Securing device 12/22/23 2220  Dressing Status Antimicrobial disc/dressing in place;Clean, Dry, Intact 12/22/23 2220  Line Care Connections checked and tightened 12/22/23 2220  Line Adjustment (NICU/IV Team Only) No 12/22/23 2220  Dressing Intervention New dressing;Adhesive placed at insertion site (IV team only) 12/22/23 2220  Dressing Change Due 12/29/23 12/22/23 2220       Ainara Eldridge, [place] 12/22/2023, 10:21 PM

## 2023-12-22 NOTE — Progress Notes (Signed)
 Progress Note   Patient: Manuel [name] FMW:[id number] DOB: 06-23-1945 DOA: 12/04/2023     18 DOS: the patient was seen and examined on 12/22/2023   Brief hospital course: 78 y.o. 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 Patient fell with walker landing on top of him.  EMS was called for assistance.  Patient refused to be transported to ED for evaluation initial. However as time when on patient note more intense pain in right groin area and due to his EMS was called back and patient was transported to ED.      ED Course:  99.3/tmx 100.7 BP 131/72, hr 113, rr 19 sat 95%  UA :rare bacteria , ketones 20 ,  Wbc 11.6, hgb 14.3, plt 191, increase pmn Na 126 ( 137), K 5.3, cl91, glu 438, cr 1.12 Lactic 2.9 ,3.3 Trop 284,3403, 8119 EKG: sinus tachycardia , RAD twave change inferior lateral leads   Patient on evaluation found to have right femoral a rupture, vascular consulted and performed repair.     Patient course was further complicated by NTSEMI , s/p right femoral a repair patient was started on heparin  drip . Cardiology was consulted recommend medical management.    PCCM was involved for comanagement. TRH was consulted for admission and further management as below  8/27.  Patient to go to the operating room for first wound VAC change. 8/28.  Patient can transfer out of the ICU.  Vascular team plans on taking back to the OR on Tuesday for wound VAC change. 8/29.  Patient and wife interested in going home after hospitalization.  Will need IV antibiotics upon discharge.  Nonsustained ventricular tachycardia 8 beats.  Patient given IV magnesium . 8/30.  Patient qualifies for  nocturnal oxygen. 8/31.  Patient wants to go home rather than acute inpatient rehab. 9/1.  Will restart MiraLAX .  Hemoglobin 9.3   Assessment and Plan: * Rupture of femoral artery (HCC) 8/14.  Patient had percutaneous transluminal angioplasty and stent placement of the right profunda femoris and common femoral artery extending into the distal right external iliac artery. 8/21.  Right external iliac artery to profunda femoris artery bypass with autologous ipsilateral superficial femoral artery, resection of infected right, femoral artery and removal of common femoral artery, profunda femoris artery and superficial femoral artery stents.  Harvest of right superficial femoral artery for bypass with endarterectomy of the right superficial femoral artery. 8/27.  Wound VAC changed in the operating room Will have another wound VAC change in the operating room on Tuesday  Severe sepsis (HCC) Secondary to Serratia.  Present on admission.  Currently on Maxipime  as per ID.  Will need total of 4 to 6 weeks of IV antibiotics with either Maxipime  or ertapenem .  Patient and wife want to do IV antibiotics at home.  Will order PICC line.  Hemorrhagic shock (HCC) Resolved.  Chronic systolic CHF (congestive heart failure) (HCC) Last EF 20%, continue low-dose Coreg , low-dose losartan  and will increase dose of Aldactone  to 25 mg daily  Uncontrolled type 2 diabetes mellitus with hyperglycemia, with long-term current use of insulin  (HCC) Decrease Lantus  5 units at bedtime, since patient will be  n.p.o. tomorrow for procedure.  Continue sliding scale.  Patient with mild hyperglycemia and hypoglycemia.  NSTEMI (non-ST elevated myocardial infarction) (HCC) Secondary to hemorrhagic shock.  Patient on aspirin , Plavix , Coreg , losartan  and statin.   Hyponatremia Last sodium normal range  PAD (peripheral artery disease) (HCC) Continue aspirin  and Plavix , history of right BKA.  AKI (acute kidney injury) (HCC) With  metabolic acidosis.  Resolved.  NSVT (nonsustained ventricular tachycardia) (HCC) 8 beat run of nonsustained ventricular tachycardia yesterday.  Patient given IV magnesium .  Case discussed with cardiology and no further treatments.  Last EF 20 to 25%.  Diarrhea Resolved.  Restart MiraLAX  daily.  Hyperkalemia Resolved  Sleep apnea Will need outpatient sleep study.  With nocturnal hypoxia with overnight oximetry he qualifies for nocturnal oxygen.  History of alcohol  abuse On thiamine   Iron  deficiency anemia Last hemoglobin 9.3.  Likely lower with IV fluids given yesterday.  Received 3 units of blood during the hospital course.        Subjective: Patient feels okay.  Offers no complaints.  Admitted with right femoral artery rupture.  Patient for wound VAC change tomorrow.  Physical Exam: Vitals:   12/21/23 1543 12/21/23 1947 12/22/23 0408 12/22/23 0804  BP: (!) 150/72 135/65 (!) 144/83 (!) 160/77  Pulse: 61 (!) 57 63 72  Resp: 20 19 19 18   Temp: 98.2 F (36.8 C) 98.2 F (36.8 C) 98 F (36.7 C) (!) 97.4 F (36.3 C)  TempSrc: Oral Oral Oral   SpO2: 100% 99% 100% 100%  Weight:      Height:       Physical Exam HENT:     Head: Normocephalic.     Mouth/Throat:     Pharynx: No oropharyngeal exudate.  Eyes:     General: Lids are normal.     Conjunctiva/sclera: Conjunctivae normal.  Cardiovascular:     Rate and Rhythm: Normal rate and regular rhythm.     Heart sounds: Normal heart sounds, S1 normal and S2 normal.  Pulmonary:     Breath sounds: No decreased breath sounds, wheezing, rhonchi or rales.  Abdominal:     Palpations: Abdomen is soft.     Tenderness: There is no abdominal tenderness.  Musculoskeletal:     Right Lower Extremity: Right leg is amputated below knee.  Skin:    General: Skin is warm.     Comments: Wound VAC right groin.  Neurological:     Mental Status: He is alert and oriented to person, place, and time.     Data Reviewed: Sodium 134,  creatinine 0.77, hemoglobin 9.3, platelet count 217, white blood count 6.9  Family Communication: Left message for wife  Disposition: Status is: Inpatient Remains inpatient appropriate because: Wound VAC change in operating room tomorrow, will order PICC line.  Planned Discharge Destination: Home with Home Health    Time spent: 28 minutes  Author: Charlie Patterson, MD 12/22/2023 11:07 AM  For on call review [email or web address].

## 2023-12-22 NOTE — Plan of Care (Signed)
 Problem: Education: Goal: Knowledge of General Education information will improve Description: Including pain rating scale, medication(s)/side effects and non-pharmacologic comfort measures Outcome: Progressing   Problem: Health Behavior/Discharge Planning: Goal: Ability to manage health-related needs will improve Outcome: Progressing   Problem: Clinical Measurements: Goal: Ability to maintain clinical measurements within normal limits will improve Outcome: Progressing Goal: Will remain free from infection Outcome: Progressing Goal: Diagnostic test results will improve Outcome: Progressing Goal: Respiratory complications will improve Outcome: Progressing Goal: Cardiovascular complication will be avoided Outcome: Progressing   Problem: Activity: Goal: Risk for activity intolerance will decrease Outcome: Progressing   Problem: Nutrition: Goal: Adequate nutrition will be maintained Outcome: Progressing   Problem: Coping: Goal: Level of anxiety will decrease Outcome: Progressing   Problem: Elimination: Goal: Will not experience complications related to bowel motility Outcome: Progressing Goal: Will not experience complications related to urinary retention Outcome: Progressing   Problem: Pain Managment: Goal: General experience of comfort will improve and/or be controlled Outcome: Progressing   Problem: Safety: Goal: Ability to remain free from injury will improve Outcome: Progressing   Problem: Skin Integrity: Goal: Risk for impaired skin integrity will decrease Outcome: Progressing   Problem: Education: Goal: Ability to describe self-care measures that may prevent or decrease complications (Diabetes Survival Skills Education) will improve Outcome: Progressing Goal: Individualized Educational Video(s) Outcome: Progressing   Problem: Coping: Goal: Ability to adjust to condition or change in health will improve Outcome: Progressing   Problem: Fluid  Volume: Goal: Ability to maintain a balanced intake and output will improve Outcome: Progressing   Problem: Health Behavior/Discharge Planning: Goal: Ability to identify and utilize available resources and services will improve Outcome: Progressing Goal: Ability to manage health-related needs will improve Outcome: Progressing   Problem: Metabolic: Goal: Ability to maintain appropriate glucose levels will improve Outcome: Progressing   Problem: Nutritional: Goal: Maintenance of adequate nutrition will improve Outcome: Progressing Goal: Progress toward achieving an optimal weight will improve Outcome: Progressing   Problem: Skin Integrity: Goal: Risk for impaired skin integrity will decrease Outcome: Progressing   Problem: Tissue Perfusion: Goal: Adequacy of tissue perfusion will improve Outcome: Progressing   Problem: Education: Goal: Understanding of CV disease, CV risk reduction, and recovery process will improve Outcome: Progressing Goal: Individualized Educational Video(s) Outcome: Progressing   Problem: Activity: Goal: Ability to return to baseline activity level will improve Outcome: Progressing   Problem: Cardiovascular: Goal: Ability to achieve and maintain adequate cardiovascular perfusion will improve Outcome: Progressing Goal: Vascular access site(s) Level 0-1 will be maintained Outcome: Progressing   Problem: Health Behavior/Discharge Planning: Goal: Ability to safely manage health-related needs after discharge will improve Outcome: Progressing   Problem: Education: Goal: Ability to describe self-care measures that may prevent or decrease complications (Diabetes Survival Skills Education) will improve Outcome: Progressing Goal: Individualized Educational Video(s) Outcome: Progressing   Problem: Cardiac: Goal: Ability to maintain an adequate cardiac output will improve Outcome: Progressing   Problem: Health Behavior/Discharge Planning: Goal:  Ability to identify and utilize available resources and services will improve Outcome: Progressing Goal: Ability to manage health-related needs will improve Outcome: Progressing   Problem: Fluid Volume: Goal: Ability to achieve a balanced intake and output will improve Outcome: Progressing   Problem: Metabolic: Goal: Ability to maintain appropriate glucose levels will improve Outcome: Progressing   Problem: Nutritional: Goal: Maintenance of adequate nutrition will improve Outcome: Progressing Goal: Maintenance of adequate weight for body size and type will improve Outcome: Progressing   Problem: Respiratory: Goal: Will regain and/or maintain adequate ventilation Outcome:  Progressing   Problem: Urinary Elimination: Goal: Ability to achieve and maintain adequate renal perfusion and functioning will improve Outcome: Progressing

## 2023-12-22 NOTE — Progress Notes (Signed)
Okay to place PICC per Dr. Rivka Safer with infectious disease.

## 2023-12-22 NOTE — Progress Notes (Addendum)
 Physical Therapy Treatment Patient Details Name: Manuel [name] MRN: [id number] DOB: 21-Jun-1945 Today's Date: 12/22/2023   History of Present Illness Patient is a 78 year old male presenting after fall while getting out of bed to wheelchair.  Found to have right femoral a rupture, vascular consulted and performed repair. Complicated by Hemorrhagic shock requiring ICU and pressors. PMH: CHF, CAD, PAD, R BKA, HTN, gout    PT Comments  Patient A&Ox4, reported 3/10 pain, eager and willing to mobilize despite visible fatigue and SOB. spO2 readings unclear, [place] at 2L donned. Pt able to perform mobility, seated prosthetic donning, sit <> stand and step pivot at minA with RW (for transfers and OOB). Further ambulation deferred due to recent laxative and family arrival at bedside. PT/pt again discussed discharge options. Pt endorsed not being at baseline level of mobility (modI) and level of assistance potentially available at home. PT continues to recommend skilled PT intervention >3hrs a day to maximize safety and return to PLOF.     If plan is discharge home, recommend the following: A little help with walking and/or transfers;A little help with bathing/dressing/bathroom;Assist for transportation;Help with stairs or ramp for entrance;Assistance with cooking/housework   Can travel by private vehicle        Equipment Recommendations  None recommended by PT    Recommendations for Other Services Rehab consult     Precautions / Restrictions Precautions Precautions: Fall Recall of Precautions/Restrictions: Intact Precaution/Restrictions Comments: wound vac right groin Restrictions Weight Bearing Restrictions Per Provider Order: No     Mobility  Bed Mobility Overal bed mobility: Needs Assistance Bed Mobility: Supine to Sit     Supine to sit: Min assist, HOB elevated     General bed mobility comments: extra time given to maximize independence, minA to complete    Transfers Overall  transfer level: Needs assistance Equipment used: Rolling walker (2 wheels) Transfers: Sit to/from Stand, Bed to chair/wheelchair/BSC Sit to Stand: Min assist   Step pivot transfers: Min assist            Ambulation/Gait                   Stairs             Wheelchair Mobility     Tilt Bed    Modified Rankin (Stroke Patients Only)       Balance Overall balance assessment: Needs assistance Sitting-balance support: Feet unsupported, No upper extremity supported Sitting balance-Leahy Scale: Good Sitting balance - Comments: able to don prosthetic with supervision/minA due to decreased endurance   Standing balance support: Bilateral upper extremity supported, During functional activity, Reliant on assistive device for balance Standing balance-Leahy Scale: Poor                              Communication    Cognition Arousal: Alert Behavior During Therapy: WFL for tasks assessed/performed   PT - Cognitive impairments: No apparent impairments                       PT - Cognition Comments: redirection needed, pt verbose Following commands: Intact Following commands impaired: Follows one step commands with increased time    Cueing Cueing Techniques: Verbal cues, Tactile cues  Exercises      General Comments        Pertinent Vitals/Pain Pain Assessment Pain Assessment: 0-10 Pain Score: 3  Pain Location: R groin pain Pain Descriptors /  Indicators: Grimacing, Moaning, Discomfort Pain Intervention(s): Limited activity within patient's tolerance, Monitored during session, Repositioned    Home Living                          Prior Function            PT Goals (current goals can now be found in the care plan section) Progress towards PT goals: Progressing toward goals    Frequency    Min 2X/week      PT Plan      Co-evaluation              AM-PAC PT 6 Clicks Mobility   Outcome Measure  Help  needed turning from your back to your side while in a flat bed without using bedrails?: A Little Help needed moving from lying on your back to sitting on the side of a flat bed without using bedrails?: A Little Help needed moving to and from a bed to a chair (including a wheelchair)?: A Little Help needed standing up from a chair using your arms (e.g., wheelchair or bedside chair)?: A Little Help needed to walk in hospital room?: A Lot Help needed climbing 3-5 steps with a railing? : A Lot 6 Click Score: 16    End of Session Equipment Utilized During Treatment: Oxygen;Gait belt (initially off oxygen, but  a 2L placed due to SOB, poor pleth readings) Activity Tolerance: Patient tolerated treatment well;Patient limited by fatigue Patient left: in bed;with call bell/phone within reach;with bed alarm set Nurse Communication: Mobility status PT Visit Diagnosis: Difficulty in walking, not elsewhere classified (R26.2);Muscle weakness (generalized) (M62.81)     Time: 8679-8643 PT Time Calculation (min) (ACUTE ONLY): 36 min  Charges:    $Therapeutic Activity: 23-37 mins PT General Charges $$ ACUTE PT VISIT: 1 Visit                     Doyal Shams PT, DPT 2:14 PM,12/22/23

## 2023-12-23 ENCOUNTER — Inpatient Hospital Stay

## 2023-12-23 ENCOUNTER — Other Ambulatory Visit: Payer: Self-pay

## 2023-12-23 ENCOUNTER — Encounter: Payer: Self-pay | Admitting: Internal Medicine

## 2023-12-23 ENCOUNTER — Encounter
Admission: EM | Disposition: A | Discharge status: Skilled Nursing Facility | Payer: Self-pay | Source: Home / Self Care | Attending: Internal Medicine

## 2023-12-23 DIAGNOSIS — R578 Other shock: Secondary | ICD-10-CM | POA: Diagnosis not present

## 2023-12-23 DIAGNOSIS — A419 Sepsis, unspecified organism: Secondary | ICD-10-CM | POA: Diagnosis not present

## 2023-12-23 DIAGNOSIS — I5022 Chronic systolic (congestive) heart failure: Secondary | ICD-10-CM | POA: Diagnosis not present

## 2023-12-23 DIAGNOSIS — I772 Rupture of artery: Secondary | ICD-10-CM | POA: Diagnosis not present

## 2023-12-23 HISTORY — PX: APPLICATION OF WOUND VAC: SHX5189

## 2023-12-23 LAB — GLUCOSE, CAPILLARY
Glucose-Capillary: 105 mg/dL — ABNORMAL HIGH (ref 70–99)
Glucose-Capillary: 93 mg/dL (ref 70–99)
Glucose-Capillary: 93 mg/dL (ref 70–99)
Glucose-Capillary: 89 mg/dL (ref 70–99)
Glucose-Capillary: 128 mg/dL — ABNORMAL HIGH (ref 70–99)
Glucose-Capillary: 110 mg/dL — ABNORMAL HIGH (ref 70–99)
Glucose-Capillary: 153 mg/dL — ABNORMAL HIGH (ref 70–99)
Glucose-Capillary: 122 mg/dL — ABNORMAL HIGH (ref 70–99)

## 2023-12-23 SURGERY — APPLICATION, WOUND VAC
Anesthesia: General | Laterality: Right

## 2023-12-23 MED ORDER — LIDOCAINE HCL (PF) 2 % IJ SOLN
INTRAMUSCULAR | Status: AC
  Filled 2023-12-23: qty 5

## 2023-12-23 MED ORDER — FENTANYL CITRATE (PF) 100 MCG/2ML IJ SOLN: 25.0000 ug | INTRAMUSCULAR | Status: DC | PRN

## 2023-12-23 MED ORDER — OXYCODONE HCL 5 MG PO TABS
ORAL_TABLET | ORAL | Status: AC
Start: 2023-12-23 — End: 2023-12-23
  Filled 2023-12-23: qty 1

## 2023-12-23 MED ORDER — GLYCOPYRROLATE 0.2 MG/ML IJ SOLN
INTRAMUSCULAR | Status: AC
  Filled 2023-12-23: qty 1

## 2023-12-23 MED ORDER — PROPOFOL 10 MG/ML IV BOLUS
INTRAVENOUS | Status: AC
  Filled 2023-12-23: qty 20

## 2023-12-23 MED ORDER — FENTANYL CITRATE (PF) 100 MCG/2ML IJ SOLN
INTRAMUSCULAR | Status: AC
Start: 2023-12-23 — End: 2023-12-23
  Filled 2023-12-23: qty 2

## 2023-12-23 MED ORDER — CHLORHEXIDINE GLUCONATE CLOTH 2 % EX PADS
6.0000 | MEDICATED_PAD | Freq: Once | CUTANEOUS | Status: AC
  Administered 2023-12-23: 6 via TOPICAL

## 2023-12-23 MED ORDER — SODIUM CHLORIDE 0.9 % IV SOLN
1.0000 g | Freq: Every day | INTRAVENOUS | Status: DC
  Administered 2023-12-23 – 2024-01-01 (×9): 1 g via INTRAVENOUS
  Filled 2023-12-23 (×11): qty 1000

## 2023-12-23 MED ORDER — EPHEDRINE SULFATE-NACL 50-0.9 MG/10ML-% IV SOSY
PREFILLED_SYRINGE | INTRAVENOUS | Status: DC | PRN
  Administered 2023-12-23: 5 mg via INTRAVENOUS

## 2023-12-23 MED ORDER — ONDANSETRON HCL 4 MG/2ML IJ SOLN
INTRAMUSCULAR | Status: AC
  Filled 2023-12-23: qty 2

## 2023-12-23 MED ORDER — PROPOFOL 1000 MG/100ML IV EMUL
INTRAVENOUS | Status: AC
  Filled 2023-12-23: qty 100

## 2023-12-23 MED ORDER — FENTANYL CITRATE (PF) 100 MCG/2ML IJ SOLN
INTRAMUSCULAR | Status: DC | PRN
  Administered 2023-12-23: 50 ug via INTRAVENOUS

## 2023-12-23 MED ORDER — ONDANSETRON HCL 4 MG/2ML IJ SOLN
INTRAMUSCULAR | Status: DC | PRN
  Administered 2023-12-23: 4 mg via INTRAVENOUS

## 2023-12-23 MED ORDER — PROPOFOL 500 MG/50ML IV EMUL
INTRAVENOUS | Status: DC | PRN
  Administered 2023-12-23: 30 mg via INTRAVENOUS
  Administered 2023-12-23: 100 ug/kg/min via INTRAVENOUS

## 2023-12-23 MED ORDER — LACTATED RINGERS IV SOLN: INTRAVENOUS | Status: DC | PRN

## 2023-12-23 MED ORDER — OXYCODONE HCL 5 MG/5ML PO SOLN: 5.0000 mg | Freq: Once | ORAL | Status: AC | PRN

## 2023-12-23 MED ORDER — OXYCODONE HCL 5 MG PO TABS
5.0000 mg | ORAL_TABLET | Freq: Once | ORAL | Status: AC | PRN
  Administered 2023-12-23: 5 mg via ORAL

## 2023-12-23 MED ORDER — VASHE WOUND IRRIGATION OPTIME
TOPICAL | Status: DC | PRN
  Administered 2023-12-23: 34 [oz_av] via TOPICAL

## 2023-12-23 SURGICAL SUPPLY — 20 items
CANISTER WOUND CARE 500ML ATS (WOUND CARE) ×1 IMPLANT
CLEANSER WND VASHE 34 (WOUND CARE) IMPLANT
DRAPE INCISE IOBAN 66X45 STRL (DRAPES) ×1 IMPLANT
DRAPE LAPAROTOMY 100X77 ABD (DRAPES) ×1 IMPLANT
DRSG VAC GRANUFOAM LG (GAUZE/BANDAGES/DRESSINGS) ×1 IMPLANT
DRSG VAC GRANUFOAM MED (GAUZE/BANDAGES/DRESSINGS) ×1 IMPLANT
ELECTRODE REM PT RTRN 9FT ADLT (ELECTROSURGICAL) ×1 IMPLANT
GAUZE 4X4 16PLY ~~LOC~~+RFID DBL (SPONGE) ×1 IMPLANT
GLOVE BIO SURGEON STRL SZ7 (GLOVE) ×2 IMPLANT
GOWN STRL REUS W/ TWL LRG LVL3 (GOWN DISPOSABLE) ×2 IMPLANT
GOWN STRL REUS W/TWL 2XL LVL3 (GOWN DISPOSABLE) ×1 IMPLANT
KIT TURNOVER KIT A (KITS) ×1 IMPLANT
MANIFOLD NEPTUNE II (INSTRUMENTS) ×1 IMPLANT
NS IRRIG 1000ML POUR BTL (IV SOLUTION) ×1 IMPLANT
PACK BASIN MINOR ARMC (MISCELLANEOUS) ×1 IMPLANT
SOLUTION PREP PVP 2OZ (MISCELLANEOUS) ×1 IMPLANT
SPONGE T-LAP 18X18 ~~LOC~~+RFID (SPONGE) ×1 IMPLANT
SUT VIC AB 2-0 CT1 TAPERPNT 27 (SUTURE) IMPLANT
TRAP FLUID SMOKE EVACUATOR (MISCELLANEOUS) ×1 IMPLANT
WATER STERILE IRR 500ML POUR (IV SOLUTION) ×1 IMPLANT

## 2023-12-23 NOTE — Progress Notes (Signed)
 OT Cancellation Note  Patient Details Name: Manuel [name] MRN: [id number] DOB: [birth year]   Cancelled Treatment:    Reason Eval/Treat Not Completed: Patient at procedure or test/ unavailable. Pt is off the floor for wound vac replacement this AM. Will follow up this afternoon as appropriate.  Marely Apgar E Anaria Kroner 12/23/2023, 8:47 AM

## 2023-12-23 NOTE — TOC Progression Note (Signed)
 Transition of Care [facility]) - Progression Note    Patient Details  Name: Manuel [name] MRN: [id number] Date of Birth: 1945-08-22  Transition of [facility] CM/SW Contact  Corean ONEIDA Haddock, RN Phone Number: 12/23/2023, 11:02 AM  Clinical Narrative:    Notified by MD that patient now wanting to purse CIR Met with patient at bedside and he confirms  [facility] CIR admission notified  Pam with Marlo Pole with [facility],Tracy with [facility]m updated     Barriers to Discharge: Continued Medical Work up               Expected Discharge Plan and Services       Living arrangements for the past 2 months: Single Family Home                                       Social Drivers of Health (SDOH) Interventions SDOH Screenings   Food Insecurity: No Food Insecurity (12/05/2023)  Housing: Low Risk  (12/05/2023)  Transportation Needs: No Transportation Needs (12/05/2023)  Utilities: Not At Risk (12/05/2023)  Depression (PHQ2-9): High Risk (09/11/2023)  Financial Resource Strain: Low Risk  (07/11/2023)  Social Connections: Moderately Isolated (12/05/2023)  Tobacco Use: Low Risk  (12/23/2023)    Readmission Risk Interventions    12/05/2023    3:09 PM 07/09/2023    8:30 PM 02/18/2023   11:58 AM  Readmission Risk Prevention Plan  Transportation Screening Complete Complete   PCP or Specialist Appt within 3-5 Days   Complete  HRI or Home Care Consult   Complete  Social Work Consult for Recovery Care Planning/Counseling   Complete  Palliative Care Screening   Not Applicable  Medication Review Oceanographer) Complete Complete Complete  PCP or Specialist appointment within 3-5 days of discharge Complete    HRI or Home Care Consult  Complete   SW Recovery Care/Counseling Consult Complete Complete   Palliative Care Screening Not Applicable Not Applicable   Skilled Nursing Facility Not Applicable Not Applicable

## 2023-12-23 NOTE — Progress Notes (Signed)
 Inpatient Rehab Admissions Coordinator:   Pt. Now states that he wants to come to CIR. Rehab md to put a note in today and then I will send case to insurance.   Leita Kleine, MS, CCC-SLP Rehab Admissions Coordinator  [phone] (celll) [phone] (office)

## 2023-12-23 NOTE — Transfer of Care (Signed)
 Immediate Anesthesia Transfer of Care Note  Patient: Manuel [name]  Procedure(s) Performed: APPLICATION, WOUND VAC (Right)  Patient Location: PACU  Anesthesia Type:MAC  Level of Consciousness: drowsy  Airway & Oxygen Therapy: Patient Spontanous Breathing  Post-op Assessment: Report given to RN and Post -op Vital signs reviewed and stable  Post vital signs: Reviewed and stable  Last Vitals:  Vitals Value Taken Time  BP 97/59 12/23/23 08:12  Temp    Pulse 54 12/23/23 08:13  Resp 9 12/23/23 08:15  SpO2 100 % 12/23/23 08:13  Vitals shown include unfiled device data.  Last Pain:  Vitals:   12/23/23 0652  TempSrc: Temporal  PainSc: 0-No pain      Patients Stated Pain Goal: 0 (12/18/23 0900)  Complications: There were no known notable events for this encounter.

## 2023-12-23 NOTE — Anesthesia Preprocedure Evaluation (Signed)
 Anesthesia Evaluation  Patient identified by MRN, date of birth, ID band Patient awake    Reviewed: Allergy & Precautions, NPO status , Patient's Chart, lab work & pertinent test results  History of Anesthesia Complications Negative for: history of anesthetic complications  Airway Mallampati: III  TM Distance: <3 FB Neck ROM: full    Dental  (+) Missing   Pulmonary neg shortness of breath, sleep apnea    Pulmonary exam normal        Cardiovascular Exercise Tolerance: Good hypertension, (-) angina + CAD, + Past MI, + Peripheral Vascular Disease and +CHF  Normal cardiovascular exam     Neuro/Psych  Neuromuscular disease  negative psych ROS   GI/Hepatic negative GI ROS, Neg liver ROS,neg GERD  ,,  Endo/Other  negative endocrine ROSdiabetes    Renal/GU Renal disease  negative genitourinary   Musculoskeletal   Abdominal   Peds  Hematology negative hematology ROS (+)   Anesthesia Other Findings Past Medical History: 04/19/2010: Acute ST elevation myocardial infarction (STEMI) of  inferior wall (HCC)     Comment:  a.) transfered from [facility] to [facility] --> LHC/PCI (very               difficult procedure) --> 3.0 x 23 mm and 3.0 x 12 mm               Xience stents to RCA No date: Allergies No date: Arthritis No date: Benign essential hypertension 05/08/2021: Bilateral carotid artery disease (HCC)     Comment:  a.) carotid doppler 05/08/2021: 1-39% BICA 04/19/2010: CAD (coronary artery disease)     Comment:  a.) inferior STEMI 04/19/2010 --> LHC/PCI: 50-70% pD1,               80% pRI, 90/90/90% RCA (overlapping 3.0 x 23 and 3.0 x 12              mm Xience DES); b.) MV 11/10/2018: fixed minimally               reversible inferior perfusion defect No date: Cellulitis of foot No date: Chronic HFrEF (heart failure with reduced ejection fraction)  (HCC) No date: DDD (degenerative disc disease), cervical No date: Diabetes  mellitus type 2, insulin  dependent (HCC) No date: Diverticulosis No date: Full dentures No date: Gout No date: Hard of hearing 2022: History of bilateral cataract extraction No date: History of ETOH abuse No date: Hyperlipidemia 04/19/2010: Ischemic cardiomyopathy     Comment:  a.) TTE 04/19/2010: 40%; b.) TTE 04/20/2014: EF >55%;               c.) TTE 11/10/2018: EF 45%; d.) TTE 11/14/2022: EF               25-30%; e. 06/2023 Echo: EF 25-30%, nl RV size/fxn. Mild               MR. Mild-mod TR. Ao sclerosis w/o stenosis. No date: Long term current use of aspirin  No date: Long term current use of clopidogrel  No date: Lumbar degenerative disc disease No date: Lumbar radiculopathy No date: Lumbar vertebral fracture (chronic superior endplate of L1) 01/15/2023: NSTEMI (non-ST elevated myocardial infarction) (HCC) No date: OSA (obstructive sleep apnea)     Comment:  a.) unable to tolerate nocturnal PAP therapy No date: Peripheral artery disease (HCC)     Comment:  a.) stenting 05/14/21: 12 mm x 12 cm LifeStent RIGHT dis  SFA/prox pop; b.) s/p cath directed thrombolysis RIGHT               SFA/pop 06/14/21; c.) s/p mech thrombectomy + stenting               06/15/21: 8 mm x 25cm & 8 mm x 7.5cm Viabahn; d.) s/p               BILAT CFA, profunda femoris, SFA endarterectomies +               fogarty embolectomy + stenting 11/15/22: 12mm x 58mm               Lifestream BILAT CIAs, 14 mm x 6 cm Lifestream & 13 mm x               5 cm Viabahn LEFT EIA No date: Peripheral neuropathy No date: Umbilical hernia  Past Surgical History: 11/18/2022: AMPUTATION; Right     Comment:  Procedure: AMPUTATION 4TH AND 5TH RAY;  Surgeon: [name], [name];  Location: [facility];  Service:               Orthopedics/Podiatry;  Laterality: Right;  4th and 5th               toe 04/20/2023: AMPUTATION; Right     Comment:  Procedure: AMPUTATION BELOW KNEE;  Surgeon: [name], MD;  Location: [facility];  Service: Vascular;                Laterality: Right;  block then general 01/09/2023: APPLICATION OF WOUND VAC; Right     Comment:  Procedure: APPLICATION OF WOUND VAC;  Surgeon: [name], MD;  Location: [facility];  Service: Vascular;                Laterality: Right; 12/11/2023: APPLICATION OF WOUND VAC; Right     Comment:  Procedure: APPLICATION, WOUND VAC;  Surgeon: [name], MD;  Location: [facility];  Service: Vascular;                Laterality: Right; 12/12/2023: APPLICATION OF WOUND VAC; Right     Comment:  Procedure: APPLICATION, WOUND VAC;  Surgeon: [name], MD;  Location: [facility];  Service: Vascular;                Laterality: Right; 12/17/2023: APPLICATION OF WOUND VAC; Right     Comment:  Procedure: APPLICATION, WOUND VAC;  Surgeon: [name], MD;  Location: [facility];  Service: Vascular;                Laterality: Right;  EXCHANGE 03/14/2021: CATARACT EXTRACTION W/PHACO; Right     Comment:  Procedure: CATARACT EXTRACTION PHACO AND INTRAOCULAR               LENS PLACEMENT (IOC) RIGHT DIABETIC;  Surgeon:               [name], MD;  Location: [facility];  Service: Ophthalmology;  Laterality: Right;                Diabetic 16.78 01:39.9 03/28/2021: CATARACT EXTRACTION W/PHACO; Left     Comment:  Procedure: CATARACT EXTRACTION PHACO AND INTRAOCULAR               LENS PLACEMENT (IOC) LEFT DIABETIC 6.93 01:22.0;                Surgeon: [name], MD;  Location: [facility];  Service: Ophthalmology;  Laterality: Left;              Diabetic No date: COLONOSCOPY 03/2010: CORONARY ANGIOPLASTY WITH STENT PLACEMENT     Comment:  Procedure: CORONARY ANGIOPLASTY WITH STENT PLACEMENT;               Location: [name] 07/14/2023: CORONARY STENT INTERVENTION; N/A     Comment:  Procedure: CORONARY STENT INTERVENTION;   Surgeon: [name], MD;  Location: [facility];                Service: Cardiovascular;  Laterality: N/A; 11/15/2022: ENDARTERECTOMY FEMORAL; Bilateral     Comment:  Procedure: BILATERAL COMMON FEMORAL PROFUNDA FEMORIS AND              SUPERFICIAL FEMORAL ARTERY ENDARTECTOMIES, RIGHT FOGARTY               EMBOLECTOMY OF THE RIGHT SFA  AND  POPLITEAL ARTERIES.               EZELLA AND RIGHT LOWER EXTREMITY ANGIOGRAM.;  Surgeon:              [name], MD;  Location: [facility];  Service:               Vascular;  Laterality: Bilateral; 12/11/2023: ENDARTERECTOMY FEMORAL; Right     Comment:  Procedure: ENDARTERECTOMY, FEMORAL;  Surgeon: [name], MD;  Location: [facility];  Service: Vascular;                Laterality: Right;  Re-do of femoral endarterectomy with               sartorius flap 12/12/2023: FEMORAL ARTERY EXPLORATION; Right     Comment:  Procedure: EXPLORATION, ARTERY, FEMORAL- stop bleeding               with suture repair anastomaosis , abx beads;  Surgeon:               [name], MD;  Location: [facility];  Service:               Vascular;  Laterality: Right; 12/12/2023: HEMATOMA EVACUATION; N/A     Comment:  Procedure: EVACUATION HEMATOMA;  Surgeon: [name],               MD;  Location: [facility];  Service: Vascular;  Laterality:              N/A; 11/15/2022: INSERTION OF ILIAC STENT; Bilateral     Comment:  Procedure: BILATERAL STENT INSERTION IN BILATERAL                COMMON ILIAC ARTERY, STENT INSERTION OF LEFT EXTERNAL  ILIAC ARTERY. ANGIOPLASTY RIGHT TIBIAL  AND POPLITEAL               ARTERY.;  Surgeon: [name], MD;  Location: [facility];              Service: Vascular;  Laterality: Bilateral; 04/21/2023: IRRIGATION AND DEBRIDEMENT KNEE; Right     Comment:  Procedure: IRRIGATION AND DEBRIDEMENT KNEE;  Surgeon:               Robbin Redell Ned, MD;  Location: [facility];  Service:               Orthopedics;  Laterality: Right; 05/14/2021: LOWER EXTREMITY ANGIOGRAPHY; Right     Comment:  Procedure: LOWER EXTREMITY ANGIOGRAPHY;  Surgeon: [name], MD;  Location: [facility];  Service:               Cardiovascular;  Laterality: Right; 06/14/2021: LOWER EXTREMITY ANGIOGRAPHY; Right     Comment:  Procedure: Lower Extremity Angiography;  Surgeon: [name], MD;  Location: [facility];  Service:               Cardiovascular;  Laterality: Right; 11/11/2022: LOWER EXTREMITY ANGIOGRAPHY; Right     Comment:  Procedure: Lower Extremity Angiography;  Surgeon: [name], MD;  Location: [facility];  Service:               Cardiovascular;  Laterality: Right; 12/04/2023: LOWER EXTREMITY ANGIOGRAPHY; Right     Comment:  Procedure: Lower Extremity Angiography;  Surgeon:               [name], MD;  Location: [facility];               Service: Cardiovascular;  Laterality: Right; 06/15/2021: LOWER EXTREMITY INTERVENTION; Right     Comment:  Procedure: LOWER EXTREMITY INTERVENTION;  Surgeon: [name], MD;  Location: [facility];  Service:               Cardiovascular;  Laterality: Right; 07/14/2023: RIGHT/LEFT HEART CATH AND CORONARY ANGIOGRAPHY; N/A     Comment:  Procedure: RIGHT/LEFT HEART CATH AND CORONARY               ANGIOGRAPHY;  Surgeon: [name], MD;  Location:               [facility];  Service: Cardiovascular;                Laterality: N/A; No date: TONSILLECTOMY 02/17/2023: TRANSMETATARSAL AMPUTATION; Right     Comment:  Procedure: TRANSMETATARSAL AMPUTATION;  Surgeon: Lennie Barter, [name];  Location: [facility];  Service:               Orthopedics/Podiatry;  Laterality: Right; 01/09/2023: WOUND DEBRIDEMENT; Right     Comment:  Procedure: DEBRIDEMENT WOUND;  Surgeon: [name],               MD;  Location: [facility];  Service:  Vascular;  Laterality:  Right;  BMI    Body Mass Index: 31.31 kg/m      Reproductive/Obstetrics negative OB ROS                              Anesthesia Physical Anesthesia Plan  ASA: 3  Anesthesia Plan: General   Post-op Pain Management:    Induction: Intravenous  PONV Risk Score and Plan: Propofol  infusion and TIVA  Airway Management Planned: Natural Airway and Nasal Cannula  Additional Equipment:   Intra-op Plan:   Post-operative Plan:   Informed Consent: I have reviewed the patients History and Physical, chart, labs and discussed the procedure including the risks, benefits and alternatives for the proposed anesthesia with the patient or authorized representative who has indicated his/her understanding and acceptance.   Patient has DNR.  Discussed DNR with patient and Suspend DNR.   Dental Advisory Given  Plan Discussed with: Anesthesiologist, CRNA and Surgeon  Anesthesia Plan Comments: (Patient consented for risks of anesthesia including but not limited to:  - adverse reactions to medications - risk of airway placement if required - damage to eyes, teeth, lips or other oral mucosa - nerve damage due to positioning  - sore throat or hoarseness - Damage to heart, brain, nerves, lungs, other parts of body or loss of life  Patient voiced understanding and assent.)        Anesthesia Quick Evaluation

## 2023-12-23 NOTE — Progress Notes (Signed)
 Physical Medicine & Rehabilitation Consult Service  Pt discussed with rehab admissions coordinator. Chart has been reviewed. This is an 78 year old male with a history of right below-knee amputation who fell after getting out of the bed transferring to his wheelchair.  He suffered a right common femoral artery rupture with extravasation.  Patient underwent a percutaneous transluminal angioplasty and stent placement right profunda femoris and right common femoral artery extending into the right external iliac artery on 12/04/2023.  Course complicated by wound infection and the patient underwent right external iliac artery to profunda femoris artery bypass with autoLog us  ipsilateral superficial femoral artery, resection of infected right femoral artery and removal of common femoral artery, profunda femoris artery and superficial femoral artery stents.  Harvest of right superficial femoral artery for bypass with endarterectomy of the right superficial femoral artery.  Patient with wound VAC and IV antibiotics.  Patient has been working with physical therapy and is min assist for sit to stand transfers but has not attempted gait.  He has been mod to max assist with ADLs.  Patient was independent prior to arrival living with his wife in a two-level home with bedroom and bathroom on the first level.  There is a ramp to enter.  Patient was using his prosthesis and crutches to walk short distances.  Home: Home Living Family/patient expects to be discharged to:: Private residence Living Arrangements: Spouse/significant other Available Help at Discharge: Family, Available 24 hours/day Type of Home: House Home Access: Ramped entrance Home Layout: Able to live on main level with bedroom/bathroom, Two level, Laundry or work area in basement Alternate Teacher, music of Steps: 15 Alternate Level Stairs-Rails: Right, Left Bathroom Shower/Tub: Engineer, manufacturing systems: Handicapped height Bathroom  Accessibility: Yes Home Equipment: Agricultural consultant (2 wheels), BSC/3in1, Wheelchair - manual, [employer] - single point, Tub bench, Crutches Additional Comments: He has his prosthesis since yesterday  Lives With: Spouse  Functional History: Prior Function Prior Level of Function : Independent/Modified Independent Mobility Comments: Pt reports he transfers confidently to/from w/c and uses prosthesis with crutches for short distances. Recent fall during transfer resutling in hospitalization. ADLs Comments: Pt reports that he is indepenent with ADLs . Functional Status:  Mobility: Bed Mobility Overal bed mobility: Needs Assistance Bed Mobility: Supine to Sit Supine to sit: Min assist, HOB elevated Sit to supine: Min assist, +2 for physical assistance General bed mobility comments: extra time given to maximize independence, minA to complete Transfers Overall transfer level: Needs assistance Equipment used: Rolling walker (2 wheels) Transfers: Sit to/from Stand, Bed to chair/wheelchair/BSC Sit to Stand: Min assist Bed to/from chair/wheelchair/BSC transfer type:: Step pivot Stand pivot transfers: Contact guard assist Step pivot transfers: Min assist  Lateral/Scoot Transfers: Mod assist, Max assist, +2 safety/equipment, +2 physical assistance General transfer comment: pt stood 3 x total during session 1 x EOB to RW, then 2 x STS to crutches. The crutches are easier for me. Autho encouraged use of RW > crutches however once in standing pt did well with ambulation with crutches. Pt struggles with crtches to accually achieve standing safely Ambulation/Gait Ambulation/Gait assistance: Contact guard assist Gait Distance (Feet): 12 Feet Assistive device: Crutches Gait Pattern/deviations: Step-through pattern General Gait Details: Pt was able to ambulate ~ 12 ft with crutches. Chartered loss adjuster provided management of wound vac cord and puewick Gait velocity: dcreased    ADL: ADL Overall ADL's : Needs  assistance/impaired Eating/Feeding: Set up, Sitting Grooming: Set up, Sitting Upper Body Bathing: Maximal assistance Lower Body Dressing: Moderate assistance Toilet Transfer:  Moderate assistance, Maximal assistance, +2 for physical assistance Toilet Transfer Details (indicate cue type and reason): simulated, squat pivot to bedside chair Toileting- Clothing Manipulation and Hygiene: Maximal assistance, Bed level General ADL Comments: MAX A don L sock in sitting. MOD A x2 simulated BSC t/f  Cognition: Cognition Orientation Level: Oriented X4 Cognition Arousal: Alert Behavior During Therapy: WFL for tasks assessed/performed   Assessment: 78 year old male with history of right below-knee amputation and prosthesis who had a fall sustaining a right common femoral artery injury.  Patient with prolonged operative course as above.  He is further deconditioned and weakened as a result of this 18 day admission.   Plan:  This patient would benefit from acute inpatient rehab to address functional mobility and self-care. Additionally, the patient requires daily MD oversight of the active medical issues noted above. Projected goals would be mod I with w/c mobility (maybe short dx ambulation) and self-care with an ELOS of 7-10 days.  Dispo and social supports are appropriate.    Rehab Admissions Coordinator to follow up.    Arthea IVAR Gunther, MD, [facility] Physical Medicine & Rehabilitation Medical Director Rehabilitation Services 12/23/2023

## 2023-12-23 NOTE — Progress Notes (Signed)
 PHARMACY CONSULT NOTE FOR:  OUTPATIENT  PARENTERAL ANTIBIOTIC THERAPY (OPAT)  Indication: Serratia bacteremia and infected vascular graft Regimen: Ertapenem  1gm IV q24h End date: 01/08/2024 (May need to extend)   Labs - Once weekly:  CBC/D and CMP Fax weekly lab results  promptly to [phone] Please leave PICC in place until doctor has seen patient or been notified Call [phone] with nay questions or critical values  IV antibiotic discharge orders are pended. To discharging provider:  please sign these orders via discharge navigator,  Select New Orders & click on the button choice - Manage This Unsigned Work.     Thank you for allowing pharmacy to be a part of this patient's care.  Manuel [name], PharmD, BCPS, BCIDP Work Cell: [phone] 12/23/2023 12:30 PM

## 2023-12-23 NOTE — PMR Pre-admission (Shared)
 PMR Admission Coordinator Pre-Admission Assessment  Patient: Manuel [name] is an 78 y.o., male MRN: [id number] DOB: 1946/01/20 Height: 5' 8 (172.7 cm) Weight: 93.4 kg  Insurance Information HMO: yes    PPO:      PCP:      IPA:      80/20:      OTHER:  PRIMARY: [employer] Medicare      Policy#:  [id number]  Subscriber:  CM Name: ***      Phone#: ***     Fax#: *** Pre-Cert#: ***      Employer: *** Benefits:  Phone #: ***     Name: *** Eustacio. Date: ***     Deduct: ***      Out of Pocket Max: ***      Life Max: *** CIR: ***      SNF: *** Outpatient: ***     Co-Pay: *** Home Health: ***      Co-Pay: *** DME: ***     Co-Pay: *** Providers: *** SECONDARY:       Policy#:      Phone#:   Financial Counselor:       Phone#:   The "Data Collection Information Summary" for patients in Inpatient Rehabilitation Facilities with attached "Privacy Act Statement-Health Care Records" was provided and verbally reviewed with: Patient  Emergency Contact Information Contact Information     Name Relation Home Work [place] [phone]  [phone]      Other Contacts     Name Relation Home Work Mobile   Othon, Guardia   [phone]       Current Medical History  Patient Admitting Diagnosis: Rupture of femoral artery, Sepsis, hemorrhagic shock History of Present Illness: Patient is an 78 year old g. 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 Patient attempted to transition from his bed to his wheelchair and fell. initially he felt okay and refused to be brought to the hospital however as time went on he  continued to be more uncomfortable with increasing pain and subsequently was brought by EMS to the hospital. CT scan demonstrated a large right common femoral hematoma with active extravasation. Vascular consulted and performed repair. On 12/04/23  Patient had percutaneous transluminal angioplasty and stent placement of the right profunda femoris and common femoral artery extending into the distal right external iliac artery. On 12/11/23,  he underwent Right external iliac artery to profunda femoris artery bypass with autologous ipsilateral superficial femoral artery, resection of infected right, femoral artery and removal of common femoral artery, profunda femoris artery and superficial femoral artery stents and Harvest of right superficial femoral artery for bypass with endarterectomy of the right superficial femoral artery.   He had wound vac changes in the OR on 8/27 and 9/2. Course was also complicated severe sepsis secondary to Serratia which was present on admission.  Currently on Maxipime  as per ID.  Will need total of 4 weeks of IV antibiotics with either Maxipime  or ertapenem . Additionally, Pt. Developed NSTEMI  and Hemorrhagic shock requiring ICU and pressors. He was seen by PT/OT and they recommended CIR to assist  return to PLOF.  Patient's medical record from [facility] has been reviewed by the rehabilitation admission coordinator and physician.  Past Medical History  Past Medical History:  Diagnosis Date   Acute ST elevation myocardial infarction (STEMI) of inferior wall (HCC) 04/19/2010   a.) transfered from [facility] to [facility] --> LHC/PCI (very difficult procedure) --> 3.0 x 23 mm and 3.0 x 12 mm Xience stents to RCA   Allergies    Arthritis    Benign essential hypertension    Bilateral carotid artery disease (HCC) 05/08/2021   a.) carotid doppler 05/08/2021: 1-39% BICA   CAD (coronary artery disease) 04/19/2010   a.) inferior STEMI 04/19/2010 --> LHC/PCI: 50-70% pD1, 80%  pRI, 90/90/90% RCA (overlapping 3.0 x 23 and 3.0 x 12 mm Xience DES); b.) MV 11/10/2018: fixed minimally reversible inferior perfusion defect   Cellulitis of foot    Chronic HFrEF (heart failure with reduced ejection fraction) (HCC)    DDD (degenerative disc disease), cervical    Diabetes mellitus type 2, insulin  dependent (HCC)    Diverticulosis    Full dentures    Gout    Hard of hearing    History of bilateral cataract extraction 2022   History of ETOH abuse    Hyperlipidemia    Ischemic cardiomyopathy 04/19/2010   a.) TTE 04/19/2010: 40%; b.) TTE 04/20/2014: EF >55%; c.) TTE 11/10/2018: EF 45%; d.) TTE 11/14/2022: EF 25-30%; e. 06/2023 Echo: EF 25-30%, nl RV size/fxn. Mild MR. Mild-mod TR. Ao sclerosis w/o stenosis.   Long term current use of aspirin     Long term current use of clopidogrel     Lumbar degenerative disc disease    Lumbar radiculopathy    Lumbar vertebral fracture (chronic superior endplate of L1)    NSTEMI (non-ST elevated myocardial infarction) (HCC) 01/15/2023   OSA (obstructive sleep apnea)    a.) unable to tolerate nocturnal PAP therapy   Peripheral artery disease (HCC)    a.) stenting 05/14/21: 12 mm x 12 cm LifeStent RIGHT dis SFA/prox pop; b.) s/p cath directed thrombolysis RIGHT SFA/pop 06/14/21; c.) s/p mech thrombectomy + stenting 06/15/21: 8 mm x 25cm & 8 mm x 7.5cm Viabahn; d.) s/p BILAT CFA, profunda femoris, SFA endarterectomies + fogarty embolectomy + stenting 11/15/22: 12mm x 58mm Lifestream BILAT CIAs, 14 mm x 6 cm Lifestream & 13 mm x 5 cm Viabahn LEFT EIA   Peripheral neuropathy    Umbilical hernia     Has the patient had major surgery during 100 days prior to admission? Yes  Family History   family history includes Heart disease in his father; Scoliosis in his mother.  Current Medications  Current Facility-Administered Medications:    acetaminophen  (TYLENOL ) tablet 650 mg, 650 mg, Oral, Q4H PRN, Dew, Jason S, MD   albuterol  (PROVENTIL ) (2.5  MG/3ML) 0.083% nebulizer solution 2.5 mg, 2.5 mg, Nebulization, Q2H PRN, Dew, Jason S, MD   alum & mag hydroxide-simeth (MAALOX/MYLANTA) 200-200-20 MG/5ML suspension 15 mL, 15 mL, Oral, Q6H PRN, Dew, Jason S, MD, 15 mL at 12/21/23 1327   artificial tears ophthalmic solution 1 drop, 1 drop, Both Eyes, PRN, [name], MD, 1 drop at 12/08/23 1610   aspirin  EC tablet 81 mg, 81 mg, Oral, Daily, Dew, Jason S, MD, 81 mg at 12/23/23 1014   atorvastatin  (LIPITOR ) tablet 80 mg, 80 mg, Oral, QHS, Dew, Jason S, MD, 80 mg at 12/22/23 2240   carvedilol  (COREG ) tablet 3.125 mg, 3.125 mg, Oral, BID WC, Dew, Jason S,  MD, 3.125 mg at 12/22/23 1652   Chlorhexidine  Gluconate Cloth 2 % PADS 6 each, 6 each, Topical, Daily, Dew, Selinda RAMAN, MD, 6 each at 12/23/23 0932   clonazePAM  (KLONOPIN ) tablet 0.5 mg, 0.5 mg, Oral, BID BM & HS PRN, Dew, Jason S, MD, 0.5 mg at 12/22/23 2025   clopidogrel  (PLAVIX ) tablet 75 mg, 75 mg, Oral, Daily, Dew, Jason S, MD, 75 mg at 12/23/23 1015   dextrose  50 % solution 0-50 mL, 0-50 mL, Intravenous, PRN, [name], Selinda RAMAN, MD   enoxaparin  (LOVENOX ) injection 40 mg, 40 mg, Subcutaneous, Q24H, Dew, Jason S, MD, 40 mg at 12/23/23 1015   ertapenem  (INVANZ ) 1 g in sodium chloride  0.9 % 100 mL IVPB, 1 g, Intravenous, Daily, Zeigler, Dustin G, RPH   escitalopram  (LEXAPRO ) tablet 5 mg, 5 mg, Oral, Daily, Dew, Jason S, MD, 5 mg at 12/23/23 1014   feeding supplement (GLUCERNA SHAKE) (GLUCERNA SHAKE) liquid 237 mL, 237 mL, Oral, TID BM, Dew, Jason S, MD, 237 mL at 12/22/23 2025   fentaNYL  (SUBLIMAZE ) injection 25 mcg, 25 mcg, Intravenous, Q2H PRN, Dew, Jason S, MD, 25 mcg at 12/17/23 0430   fluticasone  (FLONASE ) 50 MCG/ACT nasal spray 1 spray, 1 spray, Each Nare, BID PRN, [name], MD, 1 spray at 12/15/23 9493   folic acid  (FOLVITE ) tablet 1 mg, 1 mg, Oral, Daily, Dew, Jason S, MD, 1 mg at 12/23/23 1015   gabapentin  (NEURONTIN ) capsule 100 mg, 100 mg, Oral, QHS, Dew, Jason S, MD, 100 mg at 12/22/23  2240   guaiFENesin -dextromethorphan  (ROBITUSSIN DM) 100-10 MG/5ML syrup 5 mL, 5 mL, Oral, Q4H PRN, [name], MD, 5 mL at 12/10/23 2147   hydrALAZINE  (APRESOLINE ) injection 10 mg, 10 mg, Intravenous, Q6H PRN, Dew, Jason S, MD, 10 mg at 12/12/23 1032   HYDROcodone -acetaminophen  (NORCO/VICODIN) 5-325 MG per tablet 1-2 tablet, 1-2 tablet, Oral, Q6H PRN, [name], MD, 2 tablet at 12/22/23 2025   insulin  aspart (novoLOG ) injection 0-15 Units, 0-15 Units, Subcutaneous, Q4H, Dew, Jason S, MD, 3 Units at 12/22/23 1652   insulin  glargine (LANTUS ) injection 5 Units, 5 Units, Subcutaneous, QHS, Dew, Jason S, MD, 5 Units at 12/22/23 2241   labetalol  (NORMODYNE ) injection 10 mg, 10 mg, Intravenous, Q2H PRN, Dew, Jason S, MD, 10 mg at 12/12/23 1234   loratadine  (CLARITIN ) tablet 10 mg, 10 mg, Oral, Daily PRN, Dew, Jason S, MD, 10 mg at 12/16/23 9091   losartan  (COZAAR ) tablet 25 mg, 25 mg, Oral, QHS, Dew, Jason S, MD, 25 mg at 12/22/23 2240   ondansetron  (ZOFRAN ) injection 4 mg, 4 mg, Intravenous, Q6H PRN, [name], MD, 4 mg at 12/19/23 0208   pantoprazole  (PROTONIX ) EC tablet 40 mg, 40 mg, Oral, Daily, Dew, Jason S, MD, 40 mg at 12/23/23 1014   polyethylene glycol (MIRALAX  / GLYCOLAX ) packet 17 g, 17 g, Oral, Daily, Dew, Selinda RAMAN, MD, 17 g at 12/23/23 1014   promethazine  (PHENERGAN ) 12.5 mg in sodium chloride  0.9 % 50 mL IVPB, 12.5 mg, Intravenous, Once PRN, Dew, Selinda RAMAN, MD   sodium chloride  (OCEAN) 0.65 % nasal spray 1 spray, 1 spray, Each Nare, PRN, Dew, Selinda RAMAN, MD   sodium chloride  flush (NS) 0.9 % injection 10-40 mL, 10-40 mL, Intracatheter, Q12H, Dew, Selinda RAMAN, MD, 10 mL at 12/23/23 1014   sodium chloride  flush (NS) 0.9 % injection 10-40 mL, 10-40 mL, Intracatheter, PRN, [name], Selinda RAMAN, MD   sodium chloride  flush (NS) 0.9 % injection 3 mL, 3  mL, Intravenous, Q12H, Dew, Selinda RAMAN, MD, 3 mL at 12/23/23 1015   sodium chloride  flush (NS) 0.9 % injection 3 mL, 3 mL, Intravenous, PRN, [name], Jason S, MD    spironolactone  (ALDACTONE ) tablet 25 mg, 25 mg, Oral, Daily, Dew, Jason S, MD, 25 mg at 12/23/23 1014   thiamine  (VITAMIN B1) tablet 100 mg, 100 mg, Oral, Daily, 100 mg at 12/23/23 1014 **OR** [DISCONTINUED] thiamine  (VITAMIN B1) injection 100 mg, 100 mg, Intravenous, Daily, Dew, Selinda RAMAN, MD   traZODone  (DESYREL ) tablet 50 mg, 50 mg, Oral, QHS PRN, Dew, Jason S, MD, 50 mg at 12/21/23 2145  Patients Current Diet:  Diet Order             Diet Carb Modified Fluid consistency: Thin  Diet effective now                   Precautions / Restrictions Precautions Precautions: Fall Precaution/Restrictions Comments: wound vac right groin Restrictions Weight Bearing Restrictions Per Provider Order: No   Has the patient had 2 or more falls or a fall with injury in the past year? yes  Prior Activity Level Community (5-7x/wk): Pt. active in the community PTA  Prior Functional Level Self Care: Did the patient need help bathing, dressing, using the toilet or eating? Needed some help  Indoor Mobility: Did the patient need assistance with walking from room to room (with or without device)? Needed some help  Stairs: Did the patient need assistance with internal or external stairs (with or without device)? Needed some help  Functional Cognition: Did the patient need help planning regular tasks such as shopping or remembering to take medications? Independent  Patient Information Are you of Hispanic, Latino/a,or Spanish origin?: A. No, not of Hispanic, Latino/a, or Spanish origin What is your race?: A. White Do you need or want an interpreter to communicate with a doctor or health care staff?: 0. No  Patient's Response To:  Health Literacy and Transportation Is the patient able to respond to health literacy and transportation needs?: Yes Health Literacy - How often do you need to have someone help you when you read instructions, pamphlets, or other written material from your doctor or  pharmacy?: Never In the past 12 months, has lack of transportation kept you from medical appointments or from getting medications?: No In the past 12 months, has lack of transportation kept you from meetings, work, or from getting things needed for daily living?: No  Home Assistive Devices / Equipment Home Equipment: Agricultural consultant (2 wheels), BSC/3in1, Wheelchair - manual, [employer] - single point, Tub bench, Crutches  Prior Device Use: Indicate devices/aids used by the patient prior to current illness, exacerbation or injury? Manual wheelchair  Current Functional Level Cognition  Orientation Level: Oriented X4    Extremity Assessment (includes Sensation/Coordination)  Upper Extremity Assessment: Generalized weakness, Overall WFL for tasks assessed  Lower Extremity Assessment: Generalized weakness RLE Deficits / Details: right BKA, patient reports edema. can activate hip and knee movement LLE Deficits / Details: can complete LAQ in sitting position. generalized weakness throughout    ADLs  Overall ADL's : Needs assistance/impaired Eating/Feeding: Set up, Sitting Grooming: Set up, Sitting Upper Body Bathing: Maximal assistance Lower Body Dressing: Moderate assistance Toilet Transfer: Moderate assistance, Maximal assistance, +2 for physical assistance Toilet Transfer Details (indicate cue type and reason): simulated, squat pivot to bedside chair Toileting- Clothing Manipulation and Hygiene: Maximal assistance, Bed level General ADL Comments: MAX A don L sock in sitting. MOD A x2  simulated BSC t/f    Mobility  Overal bed mobility: Needs Assistance Bed Mobility: Supine to Sit Supine to sit: Min assist, HOB elevated Sit to supine: Min assist, +2 for physical assistance General bed mobility comments: extra time given to maximize independence, minA to complete    Transfers  Overall transfer level: Needs assistance Equipment used: Rolling walker (2 wheels) Transfers: Sit to/from Stand,  Bed to chair/wheelchair/BSC Sit to Stand: Min assist Bed to/from chair/wheelchair/BSC transfer type:: Step pivot Stand pivot transfers: Contact guard assist Step pivot transfers: Min assist  Lateral/Scoot Transfers: Mod assist, Max assist, +2 safety/equipment, +2 physical assistance General transfer comment: pt stood 3 x total during session 1 x EOB to RW, then 2 x STS to crutches. The crutches are easier for me. Autho encouraged use of RW > crutches however once in standing pt did well with ambulation with crutches. Pt struggles with crtches to accually achieve standing safely    Ambulation / Gait / Stairs / Wheelchair Mobility  Ambulation/Gait Ambulation/Gait assistance: Contact guard assist Gait Distance (Feet): 12 Feet Assistive device: Crutches Gait Pattern/deviations: Step-through pattern General Gait Details: Pt was able to ambulate ~ 12 ft with crutches. Chartered loss adjuster provided management of wound vac cord and puewick Gait velocity: dcreased    Posture / Balance Dynamic Sitting Balance Sitting balance - Comments: able to don prosthetic with supervision/minA due to decreased endurance Balance Overall balance assessment: Needs assistance Sitting-balance support: Feet unsupported, No upper extremity supported Sitting balance-Leahy Scale: Good Sitting balance - Comments: able to don prosthetic with supervision/minA due to decreased endurance Standing balance support: Bilateral upper extremity supported, During functional activity, Reliant on assistive device for balance Standing balance-Leahy Scale: Poor Standing balance comment: Pt able to maintain standing balance while using RW. CGA for safety. Verbal cues for hand placement    Special considerations/life events  Wound Vac ***, Skin ***, and Special service needs ***   Previous Home Environment (from acute therapy documentation) Living Arrangements: Spouse/significant other  Lives With: Spouse Available Help at Discharge: Family,  Available 24 hours/day Type of Home: House Home Layout: Able to live on main level with bedroom/bathroom, Two level, Laundry or work area in basement Alternate Level Stairs-Rails: Right, Left Alternate Level Stairs-Number of Steps: 15 Home Access: Ramped entrance Bathroom Shower/Tub: Engineer, manufacturing systems: Handicapped height Bathroom Accessibility: Yes Home Care Services: No Additional Comments: He has his prosthesis since yesterday  Discharge Living Setting Plans for Discharge Living Setting: Patient's home Type of Home at Discharge: House Discharge Home Layout: Able to live on main level with bedroom/bathroom Discharge Home Access: Ramped entrance Discharge Bathroom Shower/Tub: Tub/shower unit, Tub only Discharge Bathroom Toilet: Handicapped height Discharge Bathroom Accessibility: Yes How Accessible: Accessible via walker Does the patient have any problems obtaining your medications?: No  Social/Family/Support Systems Patient Roles: Spouse Contact Information: [phone] Anticipated Caregiver: [name] Ability/Limitations of Caregiver: 24/7 min A Caregiver Availability: 24/7 Discharge Plan Discussed with Primary Caregiver: Yes Is Caregiver In Agreement with Plan?: Yes Does Caregiver/Family have Issues with Lodging/Transportation while Pt is in Rehab?: No  Goals Patient/Family Goal for Rehab: PT/OT Supervision Expected length of stay: 5-7 days Pt/Family Agrees to Admission and willing to participate: Yes Program Orientation Provided & Reviewed with Pt/Caregiver Including Roles  & Responsibilities: Yes  Decrease burden of Care through IP rehab admission: Not anticipated  Possible need for SNF placement upon discharge: Not anticipated   Patient Condition: I have reviewed medical records from [facility] , spoken with CM, and  patient and spouse. I met with patient at the bedside for inpatient rehabilitation assessment.  Patient will benefit from  ongoing PT, OT, and SLP, can actively participate in 3 hours of therapy a day 5 days of the week, and can make measurable gains during the admission.  Patient will also benefit from the coordinated team approach during an Inpatient Acute Rehabilitation admission.  The patient will receive intensive therapy as well as Rehabilitation physician, nursing, social worker, and care management interventions.  Due to safety, skin/wound care, disease management, medication administration, pain management, and patient education the patient requires 24 hour a day rehabilitation nursing.  The patient is currently *** with mobility and basic ADLs.  Discharge setting and therapy post discharge at home with home health is anticipated.  Patient has agreed to participate in the Acute Inpatient Rehabilitation Program and will admit {Time; today/tomorrow:10263}.  Preadmission Screen Completed By:  Leita KATHEE Kleine, 12/23/2023 12:57 PM ______________________________________________________________________   Discussed status with Dr. PIERRETTE on *** at *** and received approval for admission today.  Admission Coordinator:  Leita KATHEE Kleine, CCC-SLP, time PIERRETTEPattricia ***   Assessment/Plan: Diagnosis: *** Does the need for close, 24 hr/day Medical supervision in concert with the patient's rehab needs make it unreasonable for this patient to be served in a less intensive setting? {yes_no_potentially:3041433} Co-Morbidities requiring supervision/potential complications: *** Due to {due un:6958565}, does the patient require 24 hr/day rehab nursing? {yes_no_potentially:3041433} Does the patient require coordinated care of a physician, rehab nurse, PT, OT, and SLP to address physical and functional deficits in the context of the above medical diagnosis(es)? {yes_no_potentially:3041433} Addressing deficits in the following areas: {deficits:3041436} Can the patient actively participate in an intensive therapy program of at least 3 hrs of therapy  5 days a week? {yes_no_potentially:3041433} The potential for patient to make measurable gains while on inpatient rehab is {potential:3041437} Anticipated functional outcomes upon discharge from inpatient rehab: {functional outcomes:304600100} PT, {functional outcomes:304600100} OT, {functional outcomes:304600100} SLP Estimated rehab length of stay to reach the above functional goals is: *** Anticipated discharge destination: {anticipated dc setting:21604} 10. Overall Rehab/Functional Prognosis: {potential:3041437}   MD Signature: ***

## 2023-12-23 NOTE — Progress Notes (Signed)
 Physical Therapy Treatment Patient Details Name: Manuel [name] MRN: [id number] DOB: 11-16-1945 Today's Date: 12/23/2023   History of Present Illness Patient is an 78 year old male presenting after fall while getting out of bed to wheelchair.  Found to have right femoral a rupture, vascular consulted and performed repair. Complicated by Hemorrhagic shock requiring ICU and pressors. PMH: CHF, CAD, PAD, R BKA, HTN, gout    PT Comments  Pt was pleasant and motivated to participate during the session and put forth good effort throughout. Pt required min A and cuing for sequencing during transfer training with some min instability upon initial stand while getting crutches positioned.  Pt initially ambulated with very slow, effortful steps with the crutches with random pattern.  Step-to sequencing education provided with grossly improved stability, posture, and cadence but steps remained overall slow and effortful.  Pt will benefit from continued PT services upon discharge to safely address deficits listed in patient problem list for decreased caregiver assistance and eventual return to PLOF.      If plan is discharge home, recommend the following: A little help with walking and/or transfers;A little help with bathing/dressing/bathroom;Assist for transportation;Help with stairs or ramp for entrance;Assistance with cooking/housework   Can travel by private vehicle        Equipment Recommendations  None recommended by PT    Recommendations for Other Services       Precautions / Restrictions Precautions Precautions: Fall Precaution/Restrictions Comments: wound vac right groin and RLE prosthetic Restrictions Weight Bearing Restrictions Per Provider Order: No     Mobility  Bed Mobility               General bed mobility comments: NT, in recliner pre/post session    Transfers Overall transfer level: Needs assistance Equipment used: Crutches Transfers: Sit to/from Stand Sit to  Stand: Min assist           General transfer comment: Min to mod verbal cues for sequencing for foot placement and increased trunk flexion, min A to come to full standing and for stability while getting cruthces in place    Ambulation/Gait Ambulation/Gait assistance: Contact guard assist Gait Distance (Feet): 15 Feet Assistive device: Crutches Gait Pattern/deviations: Step-to pattern, Decreased step length - left, Decreased stance time - right Gait velocity: dcreased     General Gait Details: Effortful steps with random pattern initially with step-to pattern education provided with grossly improved cadence and stability   Stairs             Wheelchair Mobility     Tilt Bed    Modified Rankin (Stroke Patients Only)       Balance Overall balance assessment: Needs assistance   Sitting balance-Leahy Scale: Good     Standing balance support: Bilateral upper extremity supported, During functional activity, Reliant on assistive device for balance Standing balance-Leahy Scale: Fair                              Hotel manager: No apparent difficulties  Cognition Arousal: Alert Behavior During Therapy: WFL for tasks assessed/performed   PT - Cognitive impairments: No apparent impairments                         Following commands: Intact      Cueing Cueing Techniques: Verbal cues, Tactile cues, Visual cues  Exercises Other Exercises Other Exercises: Transfer sequencing with practice/education with multi-modal cuing  for proper sequencing    General Comments General comments (skin integrity, edema, etc.): increased WOB, however pt's sp02 stable at 96% or greater on 2L throughout session; wound vac intact pre/post session      Pertinent Vitals/Pain Pain Assessment Pain Score: 3  Pain Location: R groin Pain Descriptors / Indicators: Sore Pain Intervention(s): Monitored during session, Premedicated before  session    Home Living                          Prior Function            PT Goals (current goals can now be found in the care plan section) Progress towards PT goals: Progressing toward goals    Frequency    Min 2X/week      PT Plan      Co-evaluation              AM-PAC PT 6 Clicks Mobility   Outcome Measure  Help needed turning from your back to your side while in a flat bed without using bedrails?: A Little Help needed moving from lying on your back to sitting on the side of a flat bed without using bedrails?: A Little Help needed moving to and from a bed to a chair (including a wheelchair)?: A Little Help needed standing up from a chair using your arms (e.g., wheelchair or bedside chair)?: A Little Help needed to walk in hospital room?: A Little Help needed climbing 3-5 steps with a railing? : A Lot 6 Click Score: 17    End of Session Equipment Utilized During Treatment: Gait belt;Oxygen Activity Tolerance: Patient tolerated treatment well Patient left: in chair;with call bell/phone within reach;with chair alarm set Nurse Communication: Mobility status PT Visit Diagnosis: Difficulty in walking, not elsewhere classified (R26.2);Muscle weakness (generalized) (M62.81)     Time: 8368-8297 PT Time Calculation (min) (ACUTE ONLY): 31 min  Charges:    $Gait Training: 23-37 mins PT General Charges $$ ACUTE PT VISIT: 1 Visit                     D. Scott Jermarion Poffenberger PT, DPT 12/23/23, 5:16 PM

## 2023-12-23 NOTE — Progress Notes (Signed)
 Progress Note   Patient: Manuel [name] FMW:[id number] DOB: Jan 24, 1946 DOA: 12/04/2023     19 DOS: the patient was seen and examined on 12/23/2023   Brief hospital course: 78 y.o. 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 Patient fell with walker landing on top of him.  EMS was called for assistance.  Patient refused to be transported to ED for evaluation initial. However as time when on patient note more intense pain in right groin area and due to his EMS was called back and patient was transported to ED.      ED Course:  99.3/tmx 100.7 BP 131/72, hr 113, rr 19 sat 95%  UA :rare bacteria , ketones 20 ,  Wbc 11.6, hgb 14.3, plt 191, increase pmn Na 126 ( 137), K 5.3, cl91, glu 438, cr 1.12 Lactic 2.9 ,3.3 Trop 284,3403, 8119 EKG: sinus tachycardia , RAD twave change inferior lateral leads   Patient on evaluation found to have right femoral a rupture, vascular consulted and performed repair.     Patient course was further complicated by NTSEMI , s/p right femoral a repair patient was started on heparin  drip . Cardiology was consulted recommend medical management.    PCCM was involved for comanagement. TRH was consulted for admission and further management as below  8/27.  Patient to go to the operating room for first wound VAC change. 8/28.  Patient can transfer out of the ICU.  Vascular team plans on taking back to the OR on Tuesday for wound VAC change. 8/29.  Patient and wife interested in going home after hospitalization.  Will need IV antibiotics upon discharge.  Nonsustained ventricular tachycardia 8 beats.  Patient given IV magnesium . 8/30.  Patient qualifies for  nocturnal oxygen. 8/31.  Patient wants to go home rather than acute inpatient rehab. 9/1.  Will restart MiraLAX .  Hemoglobin 9.3.  PICC line placed. 9/2.  Patient told me this morning that he wanted to go to acute inpatient rehab.  Dr. [name] changed the wound VAC in the operating room and he is okay with nursing staff changing moving forward.   Assessment and Plan: * Rupture of femoral artery (HCC) 8/14.  Patient had percutaneous transluminal angioplasty and stent placement of the right profunda femoris and common femoral artery extending into the distal right external iliac artery. 8/21.  Right external iliac artery to profunda femoris artery bypass with autologous ipsilateral superficial femoral artery, resection of infected right, femoral artery and removal of common femoral artery, profunda femoris artery and superficial femoral artery stents.  Harvest of right superficial femoral artery for bypass with endarterectomy of the right superficial femoral artery. 8/27.  Wound VAC changed in the operating room 9/2.  Wound VAC changed in the operating room.  Dr. [name] gave clearance to follow-up as outpatient and have nursing staff change wound VAC moving forward.  Severe sepsis (HCC) Secondary to Serratia.  Present on admission.  Currently on Maxipime  as per ID.  Will need total of 4 to 6 weeks of IV antibiotics with ertapenem .  PICC line placed on 9/1.  Patient now stated he wanted to go to acute inpatient rehab rather  than home.  Will need follow-up with Dr. Searcy.  Hemorrhagic shock (HCC) Resolved.  Chronic systolic CHF (congestive heart failure) (HCC) Last EF 20%, continue low-dose Coreg , low-dose losartan  and Aldactone   Uncontrolled type 2 diabetes mellitus with hyperglycemia, with long-term current use of insulin  (HCC) Decreased Lantus  5 units at bedtime, may be able to go back up to 8 or 10 units upon discharge.  Continue sliding scale.  Patient with mild hyperglycemia and  hypoglycemia.  NSTEMI (non-ST elevated myocardial infarction) (HCC) Secondary to hemorrhagic shock.  Patient on aspirin , Plavix , Coreg , losartan  and statin.   Hyponatremia Last sodium 134  PAD (peripheral artery disease) (HCC) Continue aspirin  and Plavix , history of right BKA.  AKI (acute kidney injury) (HCC) With metabolic acidosis.  Resolved.  NSVT (nonsustained ventricular tachycardia) (HCC) 8 beat run of nonsustained ventricular tachycardia yesterday.  Patient given IV magnesium .  Case discussed with cardiology and no further treatments.  Last EF 20 to 25%.  Diarrhea Resolved.  Restart MiraLAX  daily.  Hyperkalemia Resolved  Sleep apnea Will need outpatient sleep study.  With nocturnal hypoxia with overnight oximetry he qualifies for nocturnal oxygen.  History of alcohol  abuse On thiamine   Iron  deficiency anemia Last hemoglobin 9.3.  Received 3 units of blood during the hospital course.        Subjective: Patient changes mind and wants to go out to acute inpatient rehab instead of going home with home health.  Had wound VAC change in the office.  Initially admitted with femoral artery rupture.  Physical Exam: Vitals:   12/23/23 0922 12/23/23 0939 12/23/23 1011 12/23/23 1120  BP: (!) 146/75 (!) 152/71 (!) 164/82 (!) 152/83  Pulse: (!) 108 71 73 75  Resp: 20 14 15 15   Temp: 97.9 F (36.6 C) 97.9 F (36.6 C) 97.7 F (36.5 C) 97.8 F (36.6 C)  TempSrc:    Oral  SpO2: 94% 100% 94% 99%  Weight:      Height:       Physical Exam HENT:     Head: Normocephalic.     Mouth/Throat:     Pharynx: No oropharyngeal exudate.  Eyes:     General: Lids are normal.     Conjunctiva/sclera: Conjunctivae normal.  Cardiovascular:     Rate and Rhythm: Normal rate and regular rhythm.     Heart sounds: Normal heart sounds, S1 normal and S2 normal.  Pulmonary:     Breath sounds: No decreased breath sounds, wheezing, rhonchi or rales.  Abdominal:     Palpations: Abdomen is  soft.     Tenderness: There is no abdominal tenderness.  Musculoskeletal:     Right Lower Extremity: Right leg is amputated below knee.  Skin:    General: Skin is warm.     Comments: Wound VAC right groin.  Neurological:     Mental Status: He is alert and oriented to person, place, and time.     Data Reviewed: Sodium 134, creatinine 0.77, hemoglobin 9.3  Disposition: Status is: Inpatient Remains inpatient appropriate because: Patient changed his mind and wants to go to acute inpatient rehab.  Acute inpatient rehab to apply for insurance authorization  Planned Discharge Destination: Acute inpatient rehab    Time spent: 28 minutes Case discussed with ID and vascular surgery.  Author: Charlie Patterson, MD 12/23/2023 2:27 PM  For on call review [email or web address].

## 2023-12-23 NOTE — Op Note (Signed)
    OPERATIVE NOTE   PROCEDURE: Irrigation and debridement of right groin wound with VAC dressing change  PRE-OPERATIVE DIAGNOSIS: Open wound right groin after excision of infected tissue with sartorius flap and arterial reconstruction  POST-OPERATIVE DIAGNOSIS: Same as above  SURGEON: Selinda Gu, MD  ASSISTANT(S): None  ANESTHESIA: MAC  ESTIMATED BLOOD LOSS: 5 cc  FINDING(S): None  SPECIMEN(S): None  INDICATIONS:   Manuel [name] is a 78 y.o. male who presents with an open right groin wound after previous major vascular reconstruction for infection and bleeding and sartorius flap placement.  He has a fairly large cavity especially laterally where the sartorius muscle was harvested and has had negative pressure dressing placements.  He is brought to the operating room for debridement as needed and VAC dressing change.  Risks and benefits were discussed..  DESCRIPTION: After obtaining full informed written consent, the patient was brought back to the operating room and placed supine upon the operating table.  The patient received IV antibiotics prior to induction.  After obtaining adequate anesthesia, the patient was prepped and draped in the standard fashion.  The wound was then opened and excisional debridement was performed to the soft tissue of the superior portion of the wound and some fibrinous exudate on the muscle flap and the muscle bed laterally.  This was done to remove all clearly non-viable tissue.  This was largely fibrinous exudate and some gelatinous material from all the fluid but there was no sign of infection.  The tissue was taken back to bleeding tissue that appeared viable.  The wound was irrigated copiously with Vashe irrigation.  After all clearly non-viable tissue was removed, a medium VAC sponge was cut to fit the wound.  The wound measurements were 17 cm in length, 6 cm in width, and 2.5 cm in depth.  Strips of [place] were used and when suction tubing was  connected a good occlusive seal was obtained. The patient was then awakened from anesthesia and taken to the recovery room in stable condition having tolerated the procedure well.  COMPLICATIONS: none  CONDITION: stable  Selinda Gu  12/23/2023, 8:37 AM   This note was created with Dragon Medical transcription system. Any errors in dictation are purely unintentional.

## 2023-12-23 NOTE — Interval H&P Note (Signed)
 History and Physical Interval Note:  12/23/2023 7:23 AM  Manuel [name]  has presented today for surgery, with the diagnosis of Groin infection.  The various methods of treatment have been discussed with the patient and family. After consideration of risks, benefits and other options for treatment, the patient has consented to  Procedure(s): APPLICATION, WOUND VAC (Right) as a surgical intervention.  The patient's history has been reviewed, patient examined, no change in status, stable for surgery.  I have reviewed the patient's chart and labs.  Questions were answered to the patient's satisfaction.     Lorriane Dehart

## 2023-12-23 NOTE — Progress Notes (Signed)
 Occupational Therapy Treatment Patient Details Name: Manuel [name] MRN: [id number] DOB: June 16, 1945 Today's Date: 12/23/2023   History of present illness Patient is a 78 year old male presenting after fall while getting out of bed to wheelchair.  Found to have right femoral a rupture, vascular consulted and performed repair. Complicated by Hemorrhagic shock requiring ICU and pressors. PMH: CHF, CAD, PAD, R BKA, HTN, gout   OT comments  Pt is supine in bed on arrival. Pleasant and agreeable to OT session. He reports pain to R hip/groin area with reports of sciatic pain from laying on that hip. Pt performed bed mobility with Min A for trunkal elevation with HOB elevated. Pt requires increased time between all tasks d/t increased WOB and need for rest breaks between all tasks, although sp02 monitored and remains 96% and above on 2L. Pt donned RLE prosthetic while seated at EOB with supervision. Min A to don extra gown as jacket. He demo STS from elevated bed height with Min/CGA then was provided crutches for step pivot to recliner with Min A for lines/leads management and cues for safety and management of crutches to allow pt to return to seated position safely. Handoff to PT once seated in recliner. He will cont to require skilled acute OT services to maximize his safety and IND to return to PLOF.       If plan is discharge home, recommend the following:  A little help with walking and/or transfers;A little help with bathing/dressing/bathroom;A lot of help with bathing/dressing/bathroom;Assistance with cooking/housework;Help with stairs or ramp for entrance   Equipment Recommendations  Other (comment) (defer)    Recommendations for Other Services Rehab consult    Precautions / Restrictions Precautions Precautions: Fall Recall of Precautions/Restrictions: Intact Precaution/Restrictions Comments: wound vac right groin and RLE prosthetic Restrictions Weight Bearing Restrictions Per Provider  Order: No       Mobility Bed Mobility Overal bed mobility: Needs Assistance Bed Mobility: Supine to Sit     Supine to sit: Min assist, HOB elevated     General bed mobility comments: increased time/effort, utilized HHA to bring trunk into upright position at EOB with HOB elevated    Transfers Overall transfer level: Needs assistance Equipment used: Rolling walker (2 wheels) Transfers: Sit to/from Stand, Bed to chair/wheelchair/BSC Sit to Stand: Contact guard assist, Min assist     Step pivot transfers: Min assist     General transfer comment: able to stand from EOB with MIN/CGA for safety, author providing crutches once pt was upright to perform step pivot to recliner, although he has been edu on use of RW being safer pt reports it is more difficult on his arms; assist to manage crutches for pt to safely descend to recliner     Balance Overall balance assessment: Needs assistance Sitting-balance support: Feet unsupported, No upper extremity supported Sitting balance-Leahy Scale: Good     Standing balance support: Bilateral upper extremity supported, During functional activity, Reliant on assistive device for balance Standing balance-Leahy Scale: Poor Standing balance comment: crutches and CGA from author during transfer                           ADL either performed or assessed with clinical judgement   ADL Overall ADL's : Needs assistance/impaired                     Lower Body Dressing: Supervision/safety Lower Body Dressing Details (indicate cue type and reason): able  to donn RLE prosthetic seated EOB with supervision; anticipate Min/mod A for LB dressing of pants/underwear to pull over hips Toilet Transfer: Stand-pivot;Minimal assistance Toilet Transfer Details (indicate cue type and reason): pt utilized crutches to perform step pivot from EOB to recliner with Min A for management of line/leads and crutches with cues for safety                 Extremity/Trunk Assessment              Vision       Perception     Praxis     Communication     Cognition Arousal: Alert Behavior During Therapy: [facility] for tasks assessed/performed                                 Following commands: Intact Following commands impaired: Follows one step commands with increased time      Cueing   Cueing Techniques: Verbal cues, Tactile cues  Exercises      Shoulder Instructions       General Comments increased WOB, however pt's sp02 stable at 96% or greater on 2L throughout session; wound vac intact pre/post session    Pertinent Vitals/ Pain       Pain Assessment Pain Assessment: Faces Faces Pain Scale: Hurts a little bit Pain Location: R groin pain and sciatic pain from laying on his R hip Pain Descriptors / Indicators: Sore, Aching Pain Intervention(s): Monitored during session, Repositioned  Home Living                                          Prior Functioning/Environment              Frequency  Min 3X/week        Progress Toward Goals  OT Goals(current goals can now be found in the care plan section)  Progress towards OT goals: Progressing toward goals  Acute Rehab OT Goals Patient Stated Goal: get stronger OT Goal Formulation: With patient/family Time For Goal Achievement: 12/29/23 Potential to Achieve Goals: Good  Plan      Co-evaluation                 AM-PAC OT 6 Clicks Daily Activity     Outcome Measure   Help from another person eating meals?: None Help from another person taking care of personal grooming?: None Help from another person toileting, which includes using toliet, bedpan, or urinal?: A Lot Help from another person bathing (including washing, rinsing, drying)?: A Lot Help from another person to put on and taking off regular upper body clothing?: A Little Help from another person to put on and taking off regular lower body clothing?: A  Lot 6 Click Score: 17    End of Session Equipment Utilized During Treatment: Oxygen;Gait belt (crutches)  OT Visit Diagnosis: Unsteadiness on feet (R26.81);Repeated falls (R29.6)   Activity Tolerance Patient tolerated treatment well   Patient Left in chair;with call bell/phone within reach;with chair alarm set   Nurse Communication Mobility status        Time: [phone] OT Time Calculation (min): 30 min  Charges: OT General Charges $OT Visit: 1 Visit OT Treatments $Self Care/Home Management : 8-22 mins $Therapeutic Activity: 8-22 mins  Manuel [name], OTR/L  12/23/23, 4:47 PM   Manuel [name] 12/23/2023,  4:41 PM

## 2023-12-24 ENCOUNTER — Encounter: Payer: Self-pay | Admitting: Vascular Surgery

## 2023-12-24 ENCOUNTER — Telehealth: Payer: Self-pay | Admitting: Family Medicine

## 2023-12-24 DIAGNOSIS — T827XXA Infection and inflammatory reaction due to other cardiac and vascular devices, implants and grafts, initial encounter: Secondary | ICD-10-CM | POA: Diagnosis not present

## 2023-12-24 DIAGNOSIS — R7881 Bacteremia: Secondary | ICD-10-CM | POA: Diagnosis not present

## 2023-12-24 DIAGNOSIS — A498 Other bacterial infections of unspecified site: Secondary | ICD-10-CM | POA: Diagnosis not present

## 2023-12-24 LAB — GLUCOSE, CAPILLARY
Glucose-Capillary: 98 mg/dL (ref 70–99)
Glucose-Capillary: 111 mg/dL — ABNORMAL HIGH (ref 70–99)
Glucose-Capillary: 111 mg/dL — ABNORMAL HIGH (ref 70–99)
Glucose-Capillary: 130 mg/dL — ABNORMAL HIGH (ref 70–99)

## 2023-12-24 MED ORDER — DIPHENHYDRAMINE HCL 25 MG PO CAPS
25.0000 mg | ORAL_CAPSULE | Freq: Four times a day (QID) | ORAL | Status: DC | PRN
  Administered 2023-12-24 – 2023-12-27 (×4): 25 mg via ORAL
  Filled 2023-12-24 (×5): qty 1

## 2023-12-24 MED ORDER — MORPHINE SULFATE (PF) 2 MG/ML IV SOLN: 1.0000 mg | Freq: Four times a day (QID) | INTRAVENOUS | Status: DC | PRN

## 2023-12-24 MED ORDER — INSULIN ASPART 100 UNIT/ML IJ SOLN
0.0000 [IU] | Freq: Three times a day (TID) | INTRAMUSCULAR | Status: DC
  Administered 2023-12-25 – 2023-12-26 (×2): 2 [IU] via SUBCUTANEOUS
  Administered 2023-12-27: 3 [IU] via SUBCUTANEOUS
  Administered 2023-12-29 (×2): 2 [IU] via SUBCUTANEOUS
  Filled 2023-12-24 (×9): qty 1

## 2023-12-24 NOTE — Telephone Encounter (Signed)
 Copied from CRM #[id number]. Topic: General - Other >> Dec 24, 2023  1:55 PM Zebedee SAUNDERS wrote: Reason for CRM: Pt's wife Fernie, [place] [phone] or [phone] calling on behalf of pt for script for CPAP. Evlyn Noralyn will sent via fax FMLA to be completed. Please call her when completed.

## 2023-12-24 NOTE — Anesthesia Postprocedure Evaluation (Signed)
 Anesthesia Post Note  Patient: Manuel [name]  Procedure(s) Performed: APPLICATION, WOUND VAC (Right)  Patient location during evaluation: PACU Anesthesia Type: General Level of consciousness: awake and alert Pain management: pain level controlled Vital Signs Assessment: post-procedure vital signs reviewed and stable Respiratory status: spontaneous breathing, nonlabored ventilation, respiratory function stable and patient connected to nasal cannula oxygen Cardiovascular status: blood pressure returned to baseline and stable Postop Assessment: no apparent nausea or vomiting Anesthetic complications: no   There were no known notable events for this encounter.   Last Vitals:  Vitals:   12/24/23 0738 12/24/23 0740  BP: (!) 152/75 (!) 159/70  Pulse: 65 63  Resp: 18 18  Temp: (!) 36.3 C (!) 36.3 C  SpO2: 100% 100%    Last Pain:  Vitals:   12/24/23 0418  TempSrc: Oral  PainSc:                  Prentice Murphy

## 2023-12-24 NOTE — Plan of Care (Signed)
 Problem: Education: Goal: Knowledge of General Education information will improve Description: Including pain rating scale, medication(s)/side effects and non-pharmacologic comfort measures Outcome: Progressing   Problem: Health Behavior/Discharge Planning: Goal: Ability to manage health-related needs will improve Outcome: Progressing   Problem: Clinical Measurements: Goal: Ability to maintain clinical measurements within normal limits will improve Outcome: Progressing Goal: Will remain free from infection Outcome: Progressing Goal: Diagnostic test results will improve Outcome: Progressing Goal: Respiratory complications will improve Outcome: Progressing Goal: Cardiovascular complication will be avoided Outcome: Progressing   Problem: Activity: Goal: Risk for activity intolerance will decrease Outcome: Progressing   Problem: Nutrition: Goal: Adequate nutrition will be maintained Outcome: Progressing   Problem: Coping: Goal: Level of anxiety will decrease Outcome: Progressing   Problem: Elimination: Goal: Will not experience complications related to bowel motility Outcome: Progressing Goal: Will not experience complications related to urinary retention Outcome: Progressing   Problem: Pain Managment: Goal: General experience of comfort will improve and/or be controlled Outcome: Progressing   Problem: Safety: Goal: Ability to remain free from injury will improve Outcome: Progressing   Problem: Skin Integrity: Goal: Risk for impaired skin integrity will decrease Outcome: Progressing   Problem: Education: Goal: Ability to describe self-care measures that may prevent or decrease complications (Diabetes Survival Skills Education) will improve Outcome: Progressing Goal: Individualized Educational Video(s) Outcome: Progressing   Problem: Coping: Goal: Ability to adjust to condition or change in health will improve Outcome: Progressing   Problem: Fluid  Volume: Goal: Ability to maintain a balanced intake and output will improve Outcome: Progressing   Problem: Health Behavior/Discharge Planning: Goal: Ability to identify and utilize available resources and services will improve Outcome: Progressing Goal: Ability to manage health-related needs will improve Outcome: Progressing   Problem: Metabolic: Goal: Ability to maintain appropriate glucose levels will improve Outcome: Progressing   Problem: Nutritional: Goal: Maintenance of adequate nutrition will improve Outcome: Progressing Goal: Progress toward achieving an optimal weight will improve Outcome: Progressing   Problem: Skin Integrity: Goal: Risk for impaired skin integrity will decrease Outcome: Progressing   Problem: Tissue Perfusion: Goal: Adequacy of tissue perfusion will improve Outcome: Progressing   Problem: Education: Goal: Understanding of CV disease, CV risk reduction, and recovery process will improve Outcome: Progressing Goal: Individualized Educational Video(s) Outcome: Progressing   Problem: Activity: Goal: Ability to return to baseline activity level will improve Outcome: Progressing   Problem: Cardiovascular: Goal: Ability to achieve and maintain adequate cardiovascular perfusion will improve Outcome: Progressing Goal: Vascular access site(s) Level 0-1 will be maintained Outcome: Progressing   Problem: Health Behavior/Discharge Planning: Goal: Ability to safely manage health-related needs after discharge will improve Outcome: Progressing   Problem: Education: Goal: Ability to describe self-care measures that may prevent or decrease complications (Diabetes Survival Skills Education) will improve Outcome: Progressing Goal: Individualized Educational Video(s) Outcome: Progressing   Problem: Cardiac: Goal: Ability to maintain an adequate cardiac output will improve Outcome: Progressing   Problem: Health Behavior/Discharge Planning: Goal:  Ability to identify and utilize available resources and services will improve Outcome: Progressing Goal: Ability to manage health-related needs will improve Outcome: Progressing   Problem: Fluid Volume: Goal: Ability to achieve a balanced intake and output will improve Outcome: Progressing   Problem: Metabolic: Goal: Ability to maintain appropriate glucose levels will improve Outcome: Progressing   Problem: Nutritional: Goal: Maintenance of adequate nutrition will improve Outcome: Progressing Goal: Maintenance of adequate weight for body size and type will improve Outcome: Progressing   Problem: Respiratory: Goal: Will regain and/or maintain adequate ventilation Outcome:  Progressing   Problem: Urinary Elimination: Goal: Ability to achieve and maintain adequate renal perfusion and functioning will improve Outcome: Progressing

## 2023-12-24 NOTE — Progress Notes (Addendum)
 Occupational Therapy Treatment Patient Details Name: Manuel [name] MRN: [id number] DOB: 08-26-1945 Today's Date: 12/24/2023   History of present illness Patient is an 78 year old male presenting after fall while getting out of bed to wheelchair.  Found to have right femoral a rupture, vascular consulted and performed repair. Complicated by Hemorrhagic shock requiring ICU and pressors. PMH: CHF, CAD, PAD, R BKA, HTN, gout   OT comments  Pt is supine in bed on arrival. He reports fatigue and grogginess from taking benadryl  this AM d/t itching all over his back and shoulders. He was agreeable to OT session focusing on UB bathing and dressing tasks in hopes of decreasing itchiness. He reports mild pain to R groin. Pt performed supine to sit at EOB with Mod A likely d/t being tired from the meds. He demo UB bathing tasks with supervision while seated at EOB and UB dressing with Min A to don personal t-shirt in case the gown was irritating his skin. He demo lateral scoots towards HOB without prosthesis on with Mod A required for lift off. Pt requesting emesis bag d/t spitting up parts of his lunch, then able to return himself to R sidelying in bed with CGA. Increased WOB at times, although sp02 stable and pt requiring rest breaks between tasks.  Pt returned to bed with all needs in place and will cont to require skilled acute OT services to maximize his safety and IND to return to PLOF.       If plan is discharge home, recommend the following:  A little help with walking and/or transfers;A little help with bathing/dressing/bathroom;A lot of help with bathing/dressing/bathroom;Assistance with cooking/housework;Help with stairs or ramp for entrance   Equipment Recommendations  Other (comment) (defer)    Recommendations for Other Services Rehab consult    Precautions / Restrictions Precautions Precautions: Fall Precaution/Restrictions Comments: wound vac right groin and RLE  prosthetic Restrictions Weight Bearing Restrictions Per Provider Order: No       Mobility Bed Mobility Overal bed mobility: Needs Assistance Bed Mobility: Supine to Sit     Supine to sit: HOB elevated, Mod assist Sit to supine: Contact guard assist   General bed mobility comments: increased assist this date d/t lethargy from taking benadryl  earlier; once more awake able to return to supine with CGA; able to lateral scoot towards HOB without prosthesis with Mod A    Transfers                   General transfer comment: deferred d/t fatigue     Balance Overall balance assessment: Needs assistance Sitting-balance support: No upper extremity supported, Feet supported Sitting balance-Leahy Scale: Good                                     ADL either performed or assessed with clinical judgement   ADL Overall ADL's : Needs assistance/impaired     Grooming: Set up;Sitting;Wash/dry hands;Wash/dry face Grooming Details (indicate cue type and reason): at EOB Upper Body Bathing: Supervision/ safety;Sitting Upper Body Bathing Details (indicate cue type and reason): at EOB     Upper Body Dressing : Minimal assistance;Sitting Upper Body Dressing Details (indicate cue type and reason): at EOB                   General ADL Comments: pt performed UB ADL session this date with increased time and rest breaks between tasks  Extremity/Trunk Assessment              Vision       Restaurant manager, fast food Communication: No apparent difficulties   Cognition Arousal: Alert Behavior During Therapy: WFL for tasks assessed/performed                                 Following commands: Intact        Cueing   Cueing Techniques: Verbal cues, Tactile cues, Visual cues  Exercises      Shoulder Instructions       General Comments      Pertinent Vitals/ Pain       Pain Assessment Pain Assessment:  0-10 Pain Score: 3  Pain Location: R groin Pain Descriptors / Indicators: Sore Pain Intervention(s): Monitored during session, Repositioned  Home Living                                          Prior Functioning/Environment              Frequency  Min 3X/week        Progress Toward Goals  OT Goals(current goals can now be found in the care plan section)  Progress towards OT goals: Progressing toward goals  Acute Rehab OT Goals Patient Stated Goal: improve strength and function OT Goal Formulation: With patient Time For Goal Achievement: 12/29/23 Potential to Achieve Goals: Good  Plan      Co-evaluation                 AM-PAC OT 6 Clicks Daily Activity     Outcome Measure   Help from another person eating meals?: None Help from another person taking care of personal grooming?: None Help from another person toileting, which includes using toliet, bedpan, or urinal?: A Lot Help from another person bathing (including washing, rinsing, drying)?: A Lot Help from another person to put on and taking off regular upper body clothing?: A Little Help from another person to put on and taking off regular lower body clothing?: A Lot 6 Click Score: 17    End of Session Equipment Utilized During Treatment: Oxygen  OT Visit Diagnosis: Unsteadiness on feet (R26.81);Repeated falls (R29.6)   Activity Tolerance Patient tolerated treatment well   Patient Left in bed;with call bell/phone within reach;with bed alarm set   Nurse Communication Mobility status        Time: 8586-8550 OT Time Calculation (min): 36 min  Charges: OT General Charges $OT Visit: 1 Visit OT Treatments $Self Care/Home Management : 23-37 mins  Manuel [name], OTR/L  12/24/23, 3:56 PM   Manuel [name] Manuel [name] 12/24/2023, 3:53 PM

## 2023-12-24 NOTE — Progress Notes (Signed)
 Inpatient Rehab Admissions Coordinator:    CIR following. Continue to await insurance auth for CIR.   Leita Kleine, MS, CCC-SLP Rehab Admissions Coordinator  [phone] (celll) [phone] (office)

## 2023-12-24 NOTE — TOC Progression Note (Signed)
 Transition of Care [facility]) - Progression Note    Patient Details  Name: Manuel [name] MRN: [id number] Date of Birth: 05/12/1945  Transition of [facility] CM/SW Contact  Lauraine JAYSON Carpen, LCSW Phone Number: 12/24/2023, 2:56 PM  Clinical Narrative:  Per CMA, insurance is offering a peer-to-peer review. MD and CIR admissions coordinator are aware. Deadline is tomorrow at 9:30 am.    Barriers to Discharge: Continued Medical Work up               Expected Discharge Plan and Services       Living arrangements for the past 2 months: Single Family Home                                       Social Drivers of Health (SDOH) Interventions SDOH Screenings   Food Insecurity: No Food Insecurity (12/05/2023)  Housing: Low Risk  (12/05/2023)  Transportation Needs: No Transportation Needs (12/05/2023)  Utilities: Not At Risk (12/05/2023)  Depression (PHQ2-9): High Risk (09/11/2023)  Financial Resource Strain: Low Risk  (07/11/2023)  Social Connections: Moderately Isolated (12/05/2023)  Tobacco Use: Low Risk  (12/23/2023)    Readmission Risk Interventions    12/05/2023    3:09 PM 07/09/2023    8:30 PM 02/18/2023   11:58 AM  Readmission Risk Prevention Plan  Transportation Screening Complete Complete   PCP or Specialist Appt within 3-5 Days   Complete  HRI or Home Care Consult   Complete  Social Work Consult for Recovery Care Planning/Counseling   Complete  Palliative Care Screening   Not Applicable  Medication Review Oceanographer) Complete Complete Complete  PCP or Specialist appointment within 3-5 days of discharge Complete    HRI or Home Care Consult  Complete   SW Recovery Care/Counseling Consult Complete Complete   Palliative Care Screening Not Applicable Not Applicable   Skilled Nursing Facility Not Applicable Not Applicable

## 2023-12-24 NOTE — Progress Notes (Signed)
 [facility]  - [place] [facility]   PATIENT NAME: Manuel [name]    MR#:  [id number]  DATE OF BIRTH:  09-28-1945  SUBJECTIVE:  no family at bedside. Patient seen earlier. Not feel like participating with PT due to eating and his skin. Tolerating PO diet.  VITALS:  Blood pressure (!) 145/68, pulse 73, temperature 98.2 F (36.8 C), temperature source Oral, resp. rate 18, height 5' 8 (1.727 m), weight 92.9 kg, SpO2 100%.  PHYSICAL EXAMINATION:   GENERAL:  78 y.o.-year-old patient with no acute distress.  LUNGS: Normal breath sounds bilaterally, no wheezing CARDIOVASCULAR: S1, S2 normal. No murmur   ABDOMEN: Soft, nontender, nondistended. Bowel sounds present.  EXTREMITIES: right groin wound vac  NEUROLOGIC: nonfocal  patient is alert and awake    LABORATORY PANEL:  CBC Recent Labs  Lab 12/22/23 0519  WBC 6.9  HGB 9.3*  HCT 28.6*  PLT 217    Chemistries  Recent Labs  Lab 12/22/23 0519  NA 134*  K 4.6  CL 106  CO2 24  GLUCOSE 135*  BUN 23  CREATININE 0.77  CALCIUM  8.5*   Assessment and Plan 78 y.o. 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 Patient fell with walker landing on top of him.   8/27.  Patient to go to the operating room for first wound VAC change. 8/28.  Patient can transfer out of the ICU.  Vascular team plans on taking back to the OR on Tuesday for wound VAC change. 8/29.  Patient and wife interested in going home after hospitalization.  Will need IV antibiotics upon discharge.  Nonsustained ventricular tachycardia 8 beats.  Patient given IV magnesium . 8/30.  Patient qualifies for nocturnal oxygen. 8/31.  Patient  wants to go home rather than acute inpatient rehab. 9/1.  Will restart MiraLAX .  Hemoglobin 9.3.  PICC line placed. 9/2.  Patient told me this morning that he wanted to go to acute inpatient rehab.  Dr. [name] changed the wound VAC in the operating room and he is okay with nursing staff changing moving forward. 9/3--I assumed care of patient. C/o itching over his body. Did not participate with PT today. Per vascular ok to d/c to rehab. CIR was denied by insurance     Assessment and Plan: * Rupture of femoral artery (HCC) 8/14.  Patient had percutaneous transluminal angioplasty and stent placement of the right profunda femoris and common femoral artery extending into the distal right external iliac artery. 8/21.  Right external iliac artery to profunda femoris artery bypass with autologous ipsilateral superficial femoral artery, resection of infected right, femoral artery and removal of common femoral artery, profunda femoris artery and superficial femoral artery stents.  Harvest of right superficial femoral artery for bypass with endarterectomy of the right superficial femoral artery. 8/27.  Wound VAC changed in the operating room 9/2.  Wound VAC changed in the operating room.  Dr. [name] gave clearance to follow-up as outpatient and have nursing staff change wound VAC moving forward.   Severe sepsis (HCC) Secondary to Serratia.  Present on admission.  Currently on Maxipime  as per ID.  Will need total of 4  to 6 weeks of IV antibiotics with ertapenem .  PICC line placed on 9/1.  Patient now stated he wanted to go to acute inpatient rehab rather than home.  Will need follow-up with Dr. Searcy.   Hemorrhagic shock (HCC) Resolved.   Chronic systolic CHF (congestive heart failure) (HCC) Last EF 20%, continue low-dose Coreg , low-dose losartan  and Aldactone    Uncontrolled type 2 diabetes mellitus with hyperglycemia, with long-term current use of insulin  (HCC) Decreased Lantus  5 units at bedtime, may be  able to go back up to 8 or 10 units upon discharge.  Continue sliding scale.  Patient with mild hyperglycemia and hypoglycemia.   NSTEMI (non-ST elevated myocardial infarction) (HCC) Secondary to hemorrhagic shock.  Patient on aspirin , Plavix , Coreg , losartan  and statin.    Hyponatremia Last sodium 134   PAD (peripheral artery disease) (HCC) Continue aspirin  and Plavix , history of right BKA.   AKI (acute kidney injury) (HCC) With metabolic acidosis.  Resolved.   NSVT (nonsustained ventricular tachycardia) (HCC) 8 beat run of nonsustained ventricular tachycardia yesterday.  Patient given IV magnesium .  Case discussed with cardiology and no further treatments.  Last EF 20 to 25%.   Diarrhea Resolved.  Restart MiraLAX  daily.   Hyperkalemia Resolved   Sleep apnea Will need outpatient sleep study.  With nocturnal hypoxia with overnight oximetry he qualifies for nocturnal oxygen.   History of alcohol  abuse On thiamine    Iron  deficiency anemia Last hemoglobin 9.3.  Received 3 units of blood during the hospital course.     Procedures: Family communication :none today Consults : vascular, infectious disease CODE STATUS: DNR DVT Prophylaxis : Lovenox  Level of care: Med-Surg Status is: Inpatient Remains inpatient appropriate because: awaiting TOC to discuss with patient and wife regarding discharge planning home with home health versus rehab per [facility] CIR was denied by insurance. Peer to peer was done    TOTAL TIME TAKING CARE OF THIS PATIENT: 40 minutes.  >50% time spent on counselling and coordination of care  Note: This dictation was prepared with Dragon dictation along with smaller phrase technology. Any transcriptional errors that result from this process are unintentional.  [name] M.D    [facility]   CC: Primary care physician; [name], [name], MD

## 2023-12-24 NOTE — Progress Notes (Signed)
 PT Cancellation Note  Patient Details Name: STEPHFON BOVEY MRN: [id number] DOB: 1945-08-03   Cancelled Treatment:    Reason Eval/Treat Not Completed: Other (comment) Pt laying in bed reporting that he was having a lot of issues with itching and that he took a Benedryl and is too tired and out of it to try and work with PT today.  Will maintain on caseload and attempt to see as appropriate.  Carmin JONELLE Deed, DPT 12/24/2023, 2:13 PM

## 2023-12-24 NOTE — Progress Notes (Signed)
 Progress Note    12/24/2023 9:30 AM 1 Day Post-Op  Subjective:  Manuel [name] is now POD #1 from:  PROCEDURE: Irrigation and debridement of right groin wound with VAC dressing change  POD #13 PROCEDURE: 1.   Right groin reexploration for bleeding 2.   Evacuation of right groin hematoma 3.   Suture repair of proximal bypass anastomosis in the distal external iliac artery 4.   Replacement of the sartorius flap for coverage 5.   Placement of gentamicin  and vancomycin  antibiotic impregnated beads 6.   Negative pressure dressing placement right groin   Patient is now POD #7    PROCEDURE: Irrigation and debridement of skin, soft tissue, and muscle of the right groin wound.  Placement of negative pressure dressing right groin.   Patient is resting comfortably in bed this morning in ICU eating breakfast. Patients wife is at his side this morning. No complaints overnight and vitals all remain stable.   Patient is resting comfortably in bed this morning.  Wound VAC is in place holding suction.  Drainage noted to be some serosanguineous only.  Vitals are remained stable.   Vitals:   12/24/23 0738 12/24/23 0740  BP: (!) 152/75 (!) 159/70  Pulse: 65 63  Resp: 18 18  Temp: (!) 97.4 F (36.3 C) (!) 97.4 F (36.3 C)  SpO2: 100% 100%   Physical Exam: Cardiac:  RRR.  Normal S1 and S2 no murmurs appreciated. Lungs: Lungs on auscultation are rhonchorous throughout.  Diminished in the bases.  No rales or wheezing noted.  Normal labored breathing on 2 L nasal cannula oxygen today Incisions: Right groin postoperative incision with wound VAC in place working well. Extremities: Bilateral lower extremities warm to touch with Doppler pulses. Abdomen: Positive bowel sounds throughout, soft, nontender nondistended. Neurologic: Alert and oriented x 3, answers all questions follows commands appropriately    CBC    Component Value Date/Time   WBC 6.9 12/22/2023 0519   RBC 3.13 (L) 12/22/2023  0519   HGB 9.3 (L) 12/22/2023 0519   HGB 15.7 02/16/2012 2013   HCT 28.6 (L) 12/22/2023 0519   HCT 44.4 02/16/2012 2013   PLT 217 12/22/2023 0519   PLT 176 02/16/2012 2013   MCV 91.4 12/22/2023 0519   MCV 91 02/16/2012 2013   MCH 29.7 12/22/2023 0519   MCHC 32.5 12/22/2023 0519   RDW 15.9 (H) 12/22/2023 0519   RDW 13.0 02/16/2012 2013   LYMPHSABS 0.5 (L) 12/04/2023 1141   MONOABS 0.5 12/04/2023 1141   EOSABS 0.1 12/04/2023 1141   BASOSABS 0.0 12/04/2023 1141    BMET    Component Value Date/Time   NA 134 (L) 12/22/2023 0519   NA 137 07/21/2023 1246   NA 137 02/16/2012 2013   K 4.6 12/22/2023 0519   K 3.8 02/16/2012 2013   CL 106 12/22/2023 0519   CL 102 02/16/2012 2013   CO2 24 12/22/2023 0519   CO2 25 02/16/2012 2013   GLUCOSE 135 (H) 12/22/2023 0519   GLUCOSE 154 (H) 02/16/2012 2013   BUN 23 12/22/2023 0519   BUN 30 (H) 07/21/2023 1246   BUN 10 02/16/2012 2013   CREATININE 0.77 12/22/2023 0519   CREATININE 0.72 02/16/2012 2013   CALCIUM  8.5 (L) 12/22/2023 0519   CALCIUM  9.6 02/16/2012 2013   GFRNONAA >60 12/22/2023 0519   GFRNONAA >60 02/16/2012 2013   GFRAA >60 12/09/2018 0929   GFRAA >60 02/16/2012 2013    INR    Component Value Date/Time  INR 1.3 (H) 12/12/2023 0816     Intake/Output Summary (Last 24 hours) at 12/24/2023 0930 Last data filed at 12/24/2023 0710 Gross per 24 hour  Intake 220 ml  Output 975 ml  Net -755 ml     Assessment/Plan:  78 y.o. male is s/p SEE ABOVE 1 Day Post-Op   PLAN Patient changed his mind and wants to go to acute inpatient rehab.  Acute inpatient rehab to apply for insurance authorization  Planned Discharge Destination: Acute inpatient rehab   DVT prophylaxis:  ASA 81 mg daily, Plavix  75 mg Daily and Lovenox  40 mg SQ Q24   Manuel [name] Manuel [name] [facility] 12/24/2023 9:30 AM

## 2023-12-25 DIAGNOSIS — T827XXA Infection and inflammatory reaction due to other cardiac and vascular devices, implants and grafts, initial encounter: Secondary | ICD-10-CM | POA: Diagnosis not present

## 2023-12-25 DIAGNOSIS — A498 Other bacterial infections of unspecified site: Secondary | ICD-10-CM | POA: Diagnosis not present

## 2023-12-25 DIAGNOSIS — R7881 Bacteremia: Secondary | ICD-10-CM | POA: Diagnosis not present

## 2023-12-25 LAB — GLUCOSE, CAPILLARY
Glucose-Capillary: 81 mg/dL (ref 70–99)
Glucose-Capillary: 126 mg/dL — ABNORMAL HIGH (ref 70–99)
Glucose-Capillary: 121 mg/dL — ABNORMAL HIGH (ref 70–99)
Glucose-Capillary: 119 mg/dL — ABNORMAL HIGH (ref 70–99)

## 2023-12-25 MED ORDER — FERROUS SULFATE 325 (65 FE) MG PO TABS
325.0000 mg | ORAL_TABLET | Freq: Every day | ORAL | Status: DC
  Administered 2023-12-26 – 2024-01-01 (×5): 325 mg via ORAL
  Filled 2023-12-25 (×6): qty 1

## 2023-12-25 NOTE — Progress Notes (Signed)
 [facility]  - [place] [facility]   PATIENT NAME: Manuel [name]    MR#:  [id number]  DATE OF BIRTH:  1945-09-22  SUBJECTIVE:  no family at bedside. Spoke with patient's wife  on the phone.  Sitting up in the recliner chair. Patient states he feels sleepy however he was able to hold to 10 minute meaningful conversation with me. Tolerating PO diet. Did complain of itchy rash on the back VITALS:  Blood pressure (!) 160/85, pulse 81, temperature 98.4 F (36.9 C), temperature source Oral, resp. rate 16, height 5' 8 (1.727 m), weight 92.9 kg, SpO2 100%.  PHYSICAL EXAMINATION:   GENERAL:  78 y.o.-year-old patient with no acute distress. Chronically ill LUNGS: Normal breath sounds bilaterally, no wheezing CARDIOVASCULAR: S1, S2 normal. No murmur   ABDOMEN: Soft, nontender, nondistended. Bowel sounds present.  EXTREMITIES: right groin wound vac, right BKA/prosthetic NEUROLOGIC: nonfocal  patient is alert and awake    LABORATORY PANEL:  CBC Recent Labs  Lab 12/22/23 0519  WBC 6.9  HGB 9.3*  HCT 28.6*  PLT 217    Chemistries  Recent Labs  Lab 12/22/23 0519  NA 134*  K 4.6  CL 106  CO2 24  GLUCOSE 135*  BUN 23  CREATININE 0.77  CALCIUM  8.5*   Assessment and Plan 78 y.o. 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 Patient fell with walker landing on top of him.   8/27.  Patient to go to the operating room for first wound VAC change. 8/28.  Patient can transfer out of the ICU.  Vascular team plans on taking back to the OR on Tuesday for wound VAC change. 8/29.  Patient and wife interested in going home after hospitalization.  Will need IV  antibiotics upon discharge.  Nonsustained ventricular tachycardia 8 beats.  Patient given IV magnesium . 8/30.  Patient qualifies for nocturnal oxygen. 8/31.  Patient wants to go home rather than acute inpatient rehab. 9/1.  Will restart MiraLAX .  Hemoglobin 9.3.  PICC line placed. 9/2.  Patient told me this morning that he wanted to go to acute inpatient rehab.  Dr. [name] changed the wound VAC in the operating room and he is okay with nursing staff changing moving forward. 9/3--I assumed care of patient. C/o itching over his body. Did not participate with PT today. Per vascular ok to d/c to rehab. CIR was denied by insurance 9/4-- sitting up with the recliner chair. Had a meaningful conversation. Discussed about discharge planning to rehab patient agreeable. Discussed with wife. Patient states he is been feeling a bit sleepy. I tried to minimize and discontinue sedating meds. Patient has history of sleep apnea he is in the process of getting CPAP at home     Assessment and Plan: Rupture of femoral artery (HCC) 8/14.  Patient had percutaneous transluminal angioplasty and stent placement of the right profunda femoris and common femoral artery extending into the distal right external iliac artery. 8/21.  Right external iliac artery to profunda femoris artery bypass with autologous ipsilateral superficial femoral artery, resection of infected right, femoral artery and removal of common femoral artery, profunda femoris artery and superficial  femoral artery stents.  Harvest of right superficial femoral artery for bypass with endarterectomy of the right superficial femoral artery. 8/27.  Wound VAC changed in the operating room 9/2.  Wound VAC changed in the operating room.  Dr. [name] gave clearance to follow-up as outpatient and have nursing staff change wound VAC moving forward.   Severe sepsis (HCC) --Secondary to Serratia.  Present on admission.   --Currently on Maxipime  as per ID.  -- Will need total of  4 to 6 weeks of IV antibiotics with ertapenem .  End date for IV abx 01/08/24 --PICC line placed on 9/1.    -- follow-up with Dr. Searcy.  Hemorrhagic shock (HCC) Resolved.   Chronic systolic CHF (congestive heart failure) (HCC) Last EF 20%, continue low-dose Coreg , low-dose losartan  and Aldactone    Uncontrolled type 2 diabetes mellitus with hyperglycemia, with long-term current use of insulin  (HCC) Decreased Lantus  5 units at bedtime, may be able to go back up to 8 or 10 units upon discharge.  Continue sliding scale.  Patient with mild hyperglycemia and hypoglycemia.   NSTEMI (non-ST elevated myocardial infarction) (HCC) Secondary to hemorrhagic shock.  Patient on aspirin , Plavix , Coreg , losartan  and statin.    Hyponatremia Last sodium 134   PAD (peripheral artery disease) (HCC) Continue aspirin  and Plavix , history of right BKA.   AKI (acute kidney injury) (HCC) With metabolic acidosis.  Resolved.   NSVT (nonsustained ventricular tachycardia) (HCC) 8 beat run of nonsustained ventricular tachycardia yesterday.  Patient given IV magnesium .  Case discussed with cardiology and no further treatments.  Last EF 20 to 25%.   Diarrhea Resolved.  Restart MiraLAX  daily.   Hyperkalemia Resolved   Sleep apnea Will need outpatient sleep study.  With nocturnal hypoxia with overnight oximetry he qualifies for nocturnal oxygen.   History of alcohol  abuse On thiamine    Iron  deficiency anemia Last hemoglobin 9.3.  Received 3 units of blood during the hospital course. --resumed po iron      Procedures: Family communication :none wife Manuel [name] on the phone Consults : vascular, infectious disease CODE STATUS: DNR DVT Prophylaxis : Lovenox  Level of care: Med-Surg Status is: Inpatient Remains inpatient appropriate because: pt medically best for discharge. Placing d/c orders. TOC for Rehab planning    TOTAL TIME TAKING CARE OF THIS PATIENT: 40 minutes.  >50% time spent on counselling  and coordination of care  Note: This dictation was prepared with Dragon dictation along with smaller phrase technology. Any transcriptional errors that result from this process are unintentional.  [name] M.D    [facility]   CC: Primary care physician; [name], [name], MD

## 2023-12-25 NOTE — TOC Progression Note (Signed)
 Transition of Care [facility]) - Progression Note    Patient Details  Name: Manuel [name] MRN: [id number] Date of Birth: 1945-06-22  Transition of Care [facility]) CM/SW Contact  Marinda Cooks, RN Phone Number: 12/25/2023, 11:45 AM  Clinical Narrative:    This CM spoke with pt introduced role and confirmed dc plan for SNF/Rehab . Pt informed he did not have a SNF preference and was open to this CM sending out referrals based on his zip code. This CM completed PASSAR, FL2, and sent out referrals . Awaiting offers to provide pt list for selection. TOC will cont to follow dc planning / care coordination and update as applicable.      Barriers to Discharge: Continued Medical Work up               Expected Discharge Plan and Services       Living arrangements for the past 2 months: Single Family Home                                       Social Drivers of Health (SDOH) Interventions SDOH Screenings   Food Insecurity: No Food Insecurity (12/05/2023)  Housing: Low Risk  (12/05/2023)  Transportation Needs: No Transportation Needs (12/05/2023)  Utilities: Not At Risk (12/05/2023)  Depression (PHQ2-9): High Risk (09/11/2023)  Financial Resource Strain: Low Risk  (07/11/2023)  Social Connections: Moderately Isolated (12/05/2023)  Tobacco Use: Low Risk  (12/23/2023)    Readmission Risk Interventions    12/05/2023    3:09 PM 07/09/2023    8:30 PM 02/18/2023   11:58 AM  Readmission Risk Prevention Plan  Transportation Screening Complete Complete   PCP or Specialist Appt within 3-5 Days   Complete  HRI or Home Care Consult   Complete  Social Work Consult for Recovery Care Planning/Counseling   Complete  Palliative Care Screening   Not Applicable  Medication Review Oceanographer) Complete Complete Complete  PCP or Specialist appointment within 3-5 days of discharge Complete    HRI or Home Care Consult  Complete   SW Recovery Care/Counseling Consult Complete Complete   Palliative  Care Screening Not Applicable Not Applicable   Skilled Nursing Facility Not Applicable Not Applicable

## 2023-12-25 NOTE — Progress Notes (Signed)
 Progress Note    12/25/2023 4:40 PM 2 Days Post-Op  Subjective:  Manuel [name] is now POD #2 from:   PROCEDURE: Irrigation and debridement of right groin wound with VAC dressing change   POD #13 PROCEDURE: 1.   Right groin reexploration for bleeding 2.   Evacuation of right groin hematoma 3.   Suture repair of proximal bypass anastomosis in the distal external iliac artery 4.   Replacement of the sartorius flap for coverage 5.   Placement of gentamicin  and vancomycin  antibiotic impregnated beads 6.   Negative pressure dressing placement right groin   Patient is now POD #7    PROCEDURE: Irrigation and debridement of skin, soft tissue, and muscle of the right groin wound.  Placement of negative pressure dressing right groin.   Patient is resting comfortably in bed this morning in ICU eating breakfast. Patients wife is at his side this morning. No complaints overnight and vitals all remain stable.    Patient is resting comfortably in bed this morning.  Wound VAC is in place holding suction.  Drainage noted to be some serosanguineous only.  Vitals are remained stable.   Vitals:   12/25/23 0730 12/25/23 1353  BP: 126/78 (!) 160/85  Pulse: 73 81  Resp: 17 16  Temp: 97.8 F (36.6 C) 98.4 F (36.9 C)  SpO2: 98% 100%   Physical Exam: Cardiac:  RRR.  Normal S1 and S2 no murmurs appreciated. Lungs: Lungs on auscultation are rhonchorous throughout.  Diminished in the bases.  No rales or wheezing noted.  Normal labored breathing on 2 L nasal cannula oxygen today Incisions: Right groin postoperative incision with wound VAC in place working well. Extremities: Bilateral lower extremities warm to touch with Doppler pulses. Abdomen: Positive bowel sounds throughout, soft, nontender nondistended. Neurologic: Alert and oriented x 3, answers all questions follows commands appropriately  CBC    Component Value Date/Time   WBC 6.9 12/22/2023 0519   RBC 3.13 (L) 12/22/2023 0519   HGB  9.3 (L) 12/22/2023 0519   HGB 15.7 02/16/2012 2013   HCT 28.6 (L) 12/22/2023 0519   HCT 44.4 02/16/2012 2013   PLT 217 12/22/2023 0519   PLT 176 02/16/2012 2013   MCV 91.4 12/22/2023 0519   MCV 91 02/16/2012 2013   MCH 29.7 12/22/2023 0519   MCHC 32.5 12/22/2023 0519   RDW 15.9 (H) 12/22/2023 0519   RDW 13.0 02/16/2012 2013   LYMPHSABS 0.5 (L) 12/04/2023 1141   MONOABS 0.5 12/04/2023 1141   EOSABS 0.1 12/04/2023 1141   BASOSABS 0.0 12/04/2023 1141    BMET    Component Value Date/Time   NA 134 (L) 12/22/2023 0519   NA 137 07/21/2023 1246   NA 137 02/16/2012 2013   K 4.6 12/22/2023 0519   K 3.8 02/16/2012 2013   CL 106 12/22/2023 0519   CL 102 02/16/2012 2013   CO2 24 12/22/2023 0519   CO2 25 02/16/2012 2013   GLUCOSE 135 (H) 12/22/2023 0519   GLUCOSE 154 (H) 02/16/2012 2013   BUN 23 12/22/2023 0519   BUN 30 (H) 07/21/2023 1246   BUN 10 02/16/2012 2013   CREATININE 0.77 12/22/2023 0519   CREATININE 0.72 02/16/2012 2013   CALCIUM  8.5 (L) 12/22/2023 0519   CALCIUM  9.6 02/16/2012 2013   GFRNONAA >60 12/22/2023 0519   GFRNONAA >60 02/16/2012 2013   GFRAA >60 12/09/2018 0929   GFRAA >60 02/16/2012 2013    INR    Component Value Date/Time   INR  1.3 (H) 12/12/2023 0816     Intake/Output Summary (Last 24 hours) at 12/25/2023 1640 Last data filed at 12/25/2023 1100 Gross per 24 hour  Intake 820 ml  Output 600 ml  Net 220 ml     Assessment/Plan:  78 y.o. male is s/p SEE ABOVE 2 Days Post-Op   PLAN Patient was denied for: Inpatient acute rehab.  Patient will continue to need to go to rehab we will look for outside sources.  As soon as the patient has been accepted that is okay per vascular surgery for patient to be discharged to rehab. Patient will follow-up with vein and vascular surgery 2 weeks after patient has been discharged from the hospital.  Patient will need to bring to clinic on that visit wound VAC supplies so we may change the wound VAC and assess the  wound.  Patient's wife was informed.  DVT prophylaxis:   ASA 81 mg daily, Plavix  75 mg Daily and Lovenox  40 mg SQ Q24    Trayvond Viets [facility] 12/25/2023 4:40 PM

## 2023-12-25 NOTE — Plan of Care (Signed)

## 2023-12-25 NOTE — Progress Notes (Signed)
 Inpatient Rehab Admissions Coordinator:    Insurance denied CIR. Per TOC Pt. Wants to pursue SNF.   Leita Kleine, MS, CCC-SLP Rehab Admissions Coordinator  [phone] (celll) [phone] (office)

## 2023-12-25 NOTE — Progress Notes (Signed)
 Mobility Specialist - Progress Note   12/25/23 1427  Mobility  Activity Stood at bedside;Pivoted/transferred to/from [facility]  Level of Assistance Minimal assist, patient does 75% or more (+2)  Assistive Device None;BSC  Distance Ambulated (ft) 2 ft  Activity Response Tolerated well  Mobility visit 1 Mobility  Mobility Specialist Start Time (ACUTE ONLY) 1404  Mobility Specialist Stop Time (ACUTE ONLY) 1416  Mobility Specialist Time Calculation (min) (ACUTE ONLY) 12 min   Pt transferred from the recliner to the [facility] via SPT MinA +2 for safety, tolerated well. Pt left seated on the [facility] with instructions to utilize call bell when finished.  America Silvan Mobility Specialist 12/25/23 2:29 PM

## 2023-12-25 NOTE — Progress Notes (Signed)
 Physical Therapy Treatment Patient Details Name: Manuel [name] MRN: [id number] DOB: 06-21-1945 Today's Date: 12/25/2023   History of Present Illness Patient is an 78 year old male presenting after fall while getting out of bed to wheelchair.  Found to have right femoral a rupture, vascular consulted and performed repair. Complicated by Hemorrhagic shock requiring ICU and pressors. PMH: CHF, CAD, PAD, R BKA, HTN, gout    PT Comments  Pt was long sitting in bed upon arrival. He is A and O x 4. Agrees to session and remains cooperative. Pt is very talkative but was able to stay more focused than previous session observed by author. RLE much less inflammation versus previously observed. Pt was able to wear prosthesis with inner soft liner on top of silicone liner. Pt still remains far from his baseline. Was able to stand at EOB with assistance and tolerate ambulation ~ 20 ft total with crutches. Author encourages pt to use RW but remains unwilling. Author has to manage wound vac throughout gait trials. Pt has 2 occasions of min assist to prevent LOB. Overall session was limited by patient fatigue. He will continue to benefit from skilled PT at DC to maximize independence and safety with all ADLs    If plan is discharge home, recommend the following: A little help with walking and/or transfers;A little help with bathing/dressing/bathroom;Assist for transportation;Help with stairs or ramp for entrance;Assistance with cooking/housework     Equipment Recommendations  None recommended by PT       Precautions / Restrictions Precautions Precautions: Fall Recall of Precautions/Restrictions: Intact Precaution/Restrictions Comments: wound vac right groin and RLE prosthetic Restrictions Weight Bearing Restrictions Per Provider Order: No     Mobility  Bed Mobility Overal bed mobility: Needs Assistance Bed Mobility: Supine to Sit  Supine to sit: HOB elevated, Min assist, Mod assist, Used rails General  bed mobility comments: increased time and heavy use of bedrail + HHA +1    Transfers Overall transfer level: Needs assistance Equipment used: Crutches Transfers: Sit to/from Stand Sit to Stand: Min assist, Mod assist  General transfer comment: Pt stood 2 x total, 1x from EOB and 1 x from recliner. Vcs and min-mod assist to prevent LOB. pt contiues to be unwilling to use RW and like to use crutches more.    Ambulation/Gait Ambulation/Gait assistance: Contact guard assist, Min assist Gait Distance (Feet): 20 Feet Assistive device: Crutches Gait Pattern/deviations: Step-to pattern, Decreased step length - left, Decreased stance time - right Gait velocity: dcreased  General Gait Details: Effortful steps with random pattern initially with step-to pattern education provided with grossly improved cadence and stability    Balance Overall balance assessment: Needs assistance Sitting-balance support: No upper extremity supported, Feet supported Sitting balance-Leahy Scale: Good     Standing balance support: Bilateral upper extremity supported, During functional activity, Reliant on assistive device for balance Standing balance-Leahy Scale: Fair      Hotel manager: No apparent difficulties  Cognition Arousal: Alert Behavior During Therapy: WFL for tasks assessed/performed   PT - Cognitive impairments: No apparent impairments      PT - Cognition Comments: redirection needed, pt verbose Following commands: Intact      Cueing Cueing Techniques: Verbal cues, Tactile cues     General Comments General comments (skin integrity, edema, etc.): Reviewed importance of increasing daily activity and OOB. Pt states understanding but will need reminders and encouragement to continue to focus on increasing strength and wearing prosethetic daily      Pertinent  Vitals/Pain Pain Assessment Pain Assessment: No/denies pain Pain Score: 0-No pain     PT Goals (current  goals can now be found in the care plan section) Acute Rehab PT Goals Patient Stated Goal: rehab then home Progress towards PT goals: Progressing toward goals    Frequency    Min 2X/week       Co-evaluation     PT goals addressed during session: Mobility/safety with mobility;Balance;Proper use of DME;Strengthening/ROM        AM-PAC PT 6 Clicks Mobility   Outcome Measure  Help needed turning from your back to your side while in a flat bed without using bedrails?: A Little Help needed moving from lying on your back to sitting on the side of a flat bed without using bedrails?: A Little Help needed moving to and from a bed to a chair (including a wheelchair)?: A Little Help needed standing up from a chair using your arms (e.g., wheelchair or bedside chair)?: A Little Help needed to walk in hospital room?: A Little Help needed climbing 3-5 steps with a railing? : A Lot 6 Click Score: 17    End of Session   Activity Tolerance: Patient tolerated treatment well;Patient limited by fatigue Patient left: in chair;with call bell/phone within reach;with chair alarm set Nurse Communication: Mobility status PT Visit Diagnosis: Difficulty in walking, not elsewhere classified (R26.2);Muscle weakness (generalized) (M62.81)     Time: 9069-9051 PT Time Calculation (min) (ACUTE ONLY): 18 min  Charges:    $Gait Training: 8-22 mins PT General Charges $$ ACUTE PT VISIT: 1 Visit                    Rankin Essex PTA 12/25/23, 10:44 AM

## 2023-12-25 NOTE — NC FL2 (Addendum)
 [place]  MEDICAID FL2 LEVEL OF CARE FORM     IDENTIFICATION  Patient Name: Manuel [name] Birthdate: 04-16-1946 Sex: male Admission Date (Current Location): 12/04/2023  [facility] and [place] Number:  Chiropodist and Address:         Provider Number: [id number]  Attending Physician Name and Address:  Tobie Calix, MD  Relative Name and Phone Number:  Patient    Current Level of Care: Hospital Recommended Level of Care: Skilled Nursing Facility Prior Approval Number:    Date Approved/Denied:   PASRR Number: [id number]  Discharge Plan: SNF    Current Diagnoses: Patient Active Problem List   Diagnosis Date Noted   NSVT (nonsustained ventricular tachycardia) (HCC) 12/20/2023   Diarrhea 12/19/2023   Gram-negative bacteremia 12/18/2023   Severe sepsis (HCC) 12/17/2023   Iron  deficiency anemia 12/17/2023   History of alcohol  abuse 12/17/2023   Sleep apnea 12/17/2023   Hyperkalemia 12/17/2023   Hemorrhagic shock (HCC) 12/12/2023   Serratia infection 12/09/2023   Vascular graft infection (HCC) 12/09/2023   Ischemic cardiomyopathy 12/06/2023   Coronary artery disease involving native coronary artery of native heart 12/06/2023   Rupture of femoral artery (HCC) 12/04/2023   Acute systolic heart failure (HCC) 07/12/2023   NSTEMI (non-ST elevated myocardial infarction) (HCC) 07/09/2023   Insomnia 07/09/2023   DNR (do not resuscitate)/DNI(Do Not Intubate( 05/07/2023   S/P BKA (below knee amputation), right (HCC) - 04-20-2023 05/07/2023   Current severe episode of major depressive disorder without psychotic features (HCC) 04/28/2023   AKI (acute kidney injury) (HCC) 04/19/2023   Hyponatremia 04/19/2023   Uncontrolled type 2 diabetes mellitus with hyperglycemia, with long-term current use of insulin  (HCC) 04/19/2023   Essential hypertension 04/19/2023   Dyslipidemia 04/19/2023   BPH (benign prostatic hyperplasia) 04/19/2023   Acute metabolic encephalopathy  04/19/2023   Adjustment disorder with depressed mood 11/14/2022   Chronic systolic CHF (congestive heart failure) (HCC) 11/12/2022   ETOH abuse 06/07/2021   Coronary artery disease with history of myocardial infarction without history of CABG 05/08/2021   S/P angioplasty with stent 05/08/2021   PAD (peripheral artery disease) (HCC) 05/08/2021   Diabetic polyneuropathy associated with type 2 diabetes mellitus (HCC) 09/25/2017   Lumbar degenerative disc disease 01/27/2017   Insulin  dependent type 2 diabetes mellitus (HCC) 10/18/2014   Bilateral carotid artery stenosis 04/05/2014   Gout 09/21/2013   Mixed hyperlipidemia 09/14/2013    Orientation RESPIRATION BLADDER Height & Weight     Self, Time, Situation, Place  Normal   Weight: 92.9 kg Height:  5' 8 (172.7 cm)  BEHAVIORAL SYMPTOMS/MOOD NEUROLOGICAL BOWEL NUTRITION STATUS        Diet  AMBULATORY STATUS COMMUNICATION OF NEEDS Skin   Limited Assist Verbally                         Personal Care Assistance Level of Assistance              Functional Limitations Info  Sight (wears glasses) Sight Info: Impaired        SPECIAL CARE FACTORS FREQUENCY  PT (By licensed PT), OT (By licensed OT)     PT Frequency: 5 x a wk OT Frequency: 5 x a wk            Contractures Contractures Info: Not present    Additional Factors Info  Code Status Code Status Info: DNR             Current Medications (12/25/2023):  This is the current hospital active medication list Current Facility-Administered Medications  Medication Dose Route Frequency Provider Last Rate Last Admin   acetaminophen  (TYLENOL ) tablet 650 mg  650 mg Oral Q4H PRN Dew, Jason S, MD       albuterol  (PROVENTIL ) (2.5 MG/3ML) 0.083% nebulizer solution 2.5 mg  2.5 mg Nebulization Q2H PRN Dew, Jason S, MD       alum & mag hydroxide-simeth (MAALOX/MYLANTA) 200-200-20 MG/5ML suspension 15 mL  15 mL Oral Q6H PRN Dew, Jason S, MD   15 mL at 12/21/23 1327    artificial tears ophthalmic solution 1 drop  1 drop Both Eyes PRN Dew, Jason S, MD   1 drop at 12/08/23 1610   aspirin  EC tablet 81 mg  81 mg Oral Daily Dew, Jason S, MD   81 mg at 12/24/23 0804   atorvastatin  (LIPITOR ) tablet 80 mg  80 mg Oral QHS Dew, Jason S, MD   80 mg at 12/24/23 2149   carvedilol  (COREG ) tablet 3.125 mg  3.125 mg Oral BID WC Dew, Jason S, MD   3.125 mg at 12/24/23 1734   Chlorhexidine  Gluconate Cloth 2 % PADS 6 each  6 each Topical Daily Dew, Jason S, MD   6 each at 12/24/23 0804   clonazePAM  (KLONOPIN ) tablet 0.5 mg  0.5 mg Oral BID BM & HS PRN Dew, Jason S, MD   0.5 mg at 12/24/23 2149   clopidogrel  (PLAVIX ) tablet 75 mg  75 mg Oral Daily Dew, Jason S, MD   75 mg at 12/24/23 0804   dextrose  50 % solution 0-50 mL  0-50 mL Intravenous PRN [name], MD       diphenhydrAMINE  (BENADRYL ) capsule 25 mg  25 mg Oral Q6H PRN Mansy, Jan A, MD   25 mg at 12/24/23 0810   enoxaparin  (LOVENOX ) injection 40 mg  40 mg Subcutaneous Q24H Dew, Jason S, MD   40 mg at 12/24/23 0804   ertapenem  (INVANZ ) 1 g in sodium chloride  0.9 % 100 mL IVPB  1 g Intravenous Daily Zeigler, Dustin G, RPH   Stopped at 12/24/23 9070   escitalopram  (LEXAPRO ) tablet 5 mg  5 mg Oral Daily Dew, Jason S, MD   5 mg at 12/24/23 0803   feeding supplement (GLUCERNA SHAKE) (GLUCERNA SHAKE) liquid 237 mL  237 mL Oral TID BM Dew, Jason S, MD   237 mL at 12/24/23 1311   fluticasone  (FLONASE ) 50 MCG/ACT nasal spray 1 spray  1 spray Each Nare BID PRN Dew, Jason S, MD   1 spray at 12/15/23 0506   folic acid  (FOLVITE ) tablet 1 mg  1 mg Oral Daily Dew, Jason S, MD   1 mg at 12/24/23 9196   gabapentin  (NEURONTIN ) capsule 100 mg  100 mg Oral QHS Dew, Jason S, MD   100 mg at 12/24/23 2149   guaiFENesin -dextromethorphan  (ROBITUSSIN DM) 100-10 MG/5ML syrup 5 mL  5 mL Oral Q4H PRN Dew, Jason S, MD   5 mL at 12/10/23 2147   hydrALAZINE  (APRESOLINE ) injection 10 mg  10 mg Intravenous Q6H PRN Dew, Jason S, MD   10 mg at 12/12/23 1032    HYDROcodone -acetaminophen  (NORCO/VICODIN) 5-325 MG per tablet 1-2 tablet  1-2 tablet Oral Q6H PRN Dew, Jason S, MD   2 tablet at 12/24/23 2149   insulin  aspart (novoLOG ) injection 0-15 Units  0-15 Units Subcutaneous TID WC & HS Dew, Jason S, MD       insulin  glargine (LANTUS ) injection  5 Units  5 Units Subcutaneous QHS Dew, Jason S, MD   5 Units at 12/24/23 2149   labetalol  (NORMODYNE ) injection 10 mg  10 mg Intravenous Q2H PRN Dew, Jason S, MD   10 mg at 12/12/23 1234   loratadine  (CLARITIN ) tablet 10 mg  10 mg Oral Daily PRN Dew, Jason S, MD   10 mg at 12/16/23 9091   losartan  (COZAAR ) tablet 25 mg  25 mg Oral QHS Dew, Jason S, MD   25 mg at 12/24/23 2149   morphine  (PF) 2 MG/ML injection 1 mg  1 mg Intravenous Q6H PRN Patel, Sona, MD       ondansetron  (ZOFRAN ) injection 4 mg  4 mg Intravenous Q6H PRN Dew, Jason S, MD   4 mg at 12/19/23 0208   pantoprazole  (PROTONIX ) EC tablet 40 mg  40 mg Oral Daily Dew, Jason S, MD   40 mg at 12/24/23 9196   polyethylene glycol (MIRALAX  / GLYCOLAX ) packet 17 g  17 g Oral Daily Dew, Jason S, MD   17 g at 12/24/23 0805   promethazine  (PHENERGAN ) 12.5 mg in sodium chloride  0.9 % 50 mL IVPB  12.5 mg Intravenous Once PRN Dew, Jason S, MD       sodium chloride  (OCEAN) 0.65 % nasal spray 1 spray  1 spray Each Nare PRN [name], MD       sodium chloride  flush (NS) 0.9 % injection 10-40 mL  10-40 mL Intracatheter Q12H [name], MD   10 mL at 12/24/23 0925   sodium chloride  flush (NS) 0.9 % injection 10-40 mL  10-40 mL Intracatheter PRN [name], MD       sodium chloride  flush (NS) 0.9 % injection 3 mL  3 mL Intravenous Q12H Dew, Jason S, MD   3 mL at 12/24/23 2150   sodium chloride  flush (NS) 0.9 % injection 3 mL  3 mL Intravenous PRN [name], MD       spironolactone  (ALDACTONE ) tablet 25 mg  25 mg Oral Daily Dew, Jason S, MD   25 mg at 12/24/23 9196   thiamine  (VITAMIN B1) tablet 100 mg  100 mg Oral Daily Dew, Jason S, MD   100 mg at 12/24/23 9196    traZODone  (DESYREL ) tablet 50 mg  50 mg Oral QHS PRN Dew, Jason S, MD   50 mg at 12/21/23 2145     Discharge Medications: Please see discharge summary for a list of discharge medications.  Relevant Imaging Results:  Relevant Lab Results:   Additional Information SS [id number] Informed by bed side RN pt has right groin wound vac and has been incontinent of bowel & bladder  Marinda Cooks, RN

## 2023-12-25 NOTE — Telephone Encounter (Signed)
 Copied from CRM [id number]. Topic: Clinical - Prescription Issue >> Dec 25, 2023 10:54 AM Zebedee SAUNDERS wrote: Reason for CRM: Pt's wife Bowyn, [place] [phone] stated pt will need script for oxygen and equipment. Please call Ms. Critcher.

## 2023-12-26 ENCOUNTER — Telehealth: Payer: Self-pay | Admitting: Family Medicine

## 2023-12-26 LAB — GLUCOSE, CAPILLARY
Glucose-Capillary: 123 mg/dL — ABNORMAL HIGH (ref 70–99)
Glucose-Capillary: 114 mg/dL — ABNORMAL HIGH (ref 70–99)

## 2023-12-26 MED ORDER — INSULIN ASPART PROT & ASPART (70-30 MIX) 100 UNIT/ML ~~LOC~~ SUSP: 10.0000 [IU] | Freq: Two times a day (BID) | SUBCUTANEOUS | 11 refills | Status: DC

## 2023-12-26 MED ORDER — PANTOPRAZOLE SODIUM 40 MG PO TBEC
40.0000 mg | DELAYED_RELEASE_TABLET | Freq: Every day | ORAL | 1 refills | Status: DC
Start: 2023-12-26 — End: 2024-01-01

## 2023-12-26 MED ORDER — HYDROCODONE-ACETAMINOPHEN 5-325 MG PO TABS: 1.0000 | ORAL_TABLET | Freq: Four times a day (QID) | ORAL | 0 refills | Status: DC | PRN

## 2023-12-26 MED ORDER — LORATADINE 10 MG PO TABS: 10.0000 mg | ORAL_TABLET | Freq: Every day | ORAL | 0 refills | Status: DC | PRN

## 2023-12-26 MED ORDER — TRAZODONE HCL 50 MG PO TABS: 50.0000 mg | ORAL_TABLET | Freq: Every evening | ORAL | 0 refills | Status: DC | PRN

## 2023-12-26 MED ORDER — FLUTICASONE PROPIONATE 50 MCG/ACT NA SUSP: 1.0000 | Freq: Two times a day (BID) | NASAL | 2 refills | Status: DC | PRN

## 2023-12-26 MED ORDER — POLYVINYL ALCOHOL 1.4 % OP SOLN: 1.0000 [drp] | OPHTHALMIC | 0 refills | Status: AC | PRN

## 2023-12-26 MED ORDER — ERTAPENEM IV (FOR PTA / DISCHARGE USE ONLY): 1.0000 g | INTRAVENOUS | 0 refills | Status: DC

## 2023-12-26 NOTE — Progress Notes (Signed)
 Inpatient Rehab Admissions Coordinator:  Attempted to contact pt's wife regarding if pt/wife would like to appeal insurance denial. No one answered. Left a message; awaiting return call.   Informed by TOC pt has decided to go home with [facility]. AC will sign off.   Tinnie Yvone Cohens, MS, CCC-SLP Admissions Coordinator [phone]

## 2023-12-26 NOTE — TOC Progression Note (Signed)
 Transition of Care [facility]) - Progression Note    Patient Details  Name: Manuel [name] MRN: [id number] Date of Birth: [birth year]  Transition [facility]) CM/SW Contact  Corean ONEIDA Haddock, RN Phone Number: 12/26/2023, 9:18 AM  Clinical Narrative:    Attempted to provide bed offers to patient  He states that he has decided to go home with home health Wife [name] at bedside and confirms  Notified Randine with [employer], Pam with [facility], and shuan with [facility]  RN to completed O2 qualifying sats, and if indicated MD to order O2      Barriers to Discharge: Continued Medical Work up               Expected Discharge Plan and Services       Living arrangements for the past 2 months: Single Family Home Expected Discharge Date: 12/25/23                                     Social Drivers of Health (SDOH) Interventions SDOH Screenings   Food Insecurity: No Food Insecurity (12/05/2023)  Housing: Low Risk  (12/05/2023)  Transportation Needs: No Transportation Needs (12/05/2023)  Utilities: Not At Risk (12/05/2023)  Depression (PHQ2-9): High Risk (09/11/2023)  Financial Resource Strain: Low Risk  (07/11/2023)  Social Connections: Moderately Isolated (12/05/2023)  Tobacco Use: Low Risk  (12/23/2023)    Readmission Risk Interventions    12/05/2023    3:09 PM 07/09/2023    8:30 PM 02/18/2023   11:58 AM  Readmission Risk Prevention Plan  Transportation Screening Complete Complete   PCP or Specialist Appt within 3-5 Days   Complete  HRI or Home Care Consult   Complete  Social Work Consult for Recovery Care Planning/Counseling   Complete  Palliative Care Screening   Not Applicable  Medication Review Oceanographer) Complete Complete Complete  PCP or Specialist appointment within 3-5 days of discharge Complete    HRI or Home Care Consult  Complete   SW Recovery Care/Counseling Consult Complete Complete   Palliative Care Screening Not Applicable Not Applicable   Skilled  Nursing Facility Not Applicable Not Applicable

## 2023-12-26 NOTE — TOC Transition Note (Signed)
 Transition of Care [facility]) - Discharge Note   Patient Details  Name: Manuel [name] MRN: [id number] Date of Birth: Jul 26, 1945  Transition of Care [facility]) CM/SW Contact:  Corean ONEIDA Haddock, RN Phone Number: 12/26/2023, 12:51 PM   Clinical Narrative:     Patient to dc today Per Pam with [employer] we will complete IV infusion education around 4-5pm Shaun with [facility] confirms he has everything needed to provided [facility] services Patient with qualifying O2 sats. Referral made to [employer] for portable o2 to be delivered to room prior to [employer] with [employer] confirms that home vac has been approved and will be delivered to bedside today Wife confirms she will be transporting at discharge     Barriers to Discharge: Continued Medical Work up   Patient Goals and CMS Choice            Discharge Placement                       Discharge Plan and Services Additional resources added to the After Visit Summary for                                       Social Drivers of Health (SDOH) Interventions SDOH Screenings   Food Insecurity: No Food Insecurity (12/05/2023)  Housing: Low Risk  (12/05/2023)  Transportation Needs: No Transportation Needs (12/05/2023)  Utilities: Not At Risk (12/05/2023)  Depression (PHQ2-9): High Risk (09/11/2023)  Financial Resource Strain: Low Risk  (07/11/2023)  Social Connections: Moderately Isolated (12/05/2023)  Tobacco Use: Low Risk  (12/23/2023)     Readmission Risk Interventions    12/05/2023    3:09 PM 07/09/2023    8:30 PM 02/18/2023   11:58 AM  Readmission Risk Prevention Plan  Transportation Screening Complete Complete   PCP or Specialist Appt within 3-5 Days   Complete  HRI or Home Care Consult   Complete  Social Work Consult for Recovery Care Planning/Counseling   Complete  Palliative Care Screening   Not Applicable  Medication Review [employer]) Complete Complete Complete  PCP or Specialist appointment within 3-5 days  of discharge Complete    HRI or Home Care Consult  Complete   SW Recovery Care/Counseling Consult Complete Complete   Palliative Care Screening Not Applicable Not Applicable   Skilled Nursing Facility Not Applicable Not Applicable

## 2023-12-26 NOTE — Telephone Encounter (Signed)
 Left message requesting a return call. Need to know which oxygen company patient uses.

## 2023-12-26 NOTE — Discharge Summary (Addendum)
 Physician Discharge Summary   Patient: Manuel [name] MRN: [id number] DOB: 03/29/1946  Admit date:     12/04/2023  Discharge date: 12/26/23  Discharge Physician: [name]   PCP: [name], [name], MD   Recommendations at discharge:    F/u Dr [name] in 1-2 weeks F/u PCP  on 01/02/2024 F/u Dr [name] on 01/06/2024 at 11:30 am  Discharge Diagnoses: Principal Problem:   Rupture of femoral artery (HCC) Active Problems:   Severe sepsis (HCC)   Hemorrhagic shock (HCC)   Chronic systolic CHF (congestive heart failure) (HCC)   Uncontrolled type 2 diabetes mellitus with hyperglycemia, with long-term current use of insulin  (HCC)   NSTEMI (non-ST elevated myocardial infarction) (HCC)   Hyponatremia   PAD (peripheral artery disease) (HCC)   AKI (acute kidney injury) (HCC)   Ischemic cardiomyopathy   Coronary artery disease involving native coronary artery of native heart   Serratia infection   Vascular graft infection (HCC)   Iron  deficiency anemia   History of alcohol  abuse   Sleep apnea   Hyperkalemia   Gram-negative bacteremia   Diarrhea   NSVT (nonsustained ventricular tachycardia) (HCC)   Assessment and Plan:  78 y.o. 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 Patient fell with walker landing on top of him.    8/27.  Patient to go to the operating room for first wound VAC change. 8/28.  Patient can transfer out of the ICU.  Vascular team plans on taking back to the OR on Tuesday for wound VAC change. 8/29.  Patient and wife interested in going home after hospitalization.  Will need IV antibiotics upon discharge.  Nonsustained ventricular tachycardia 8 beats.   Patient given IV magnesium . 8/30.  Patient qualifies for nocturnal oxygen. 8/31.  Patient wants to go home rather than acute inpatient rehab. 9/1.  Will restart MiraLAX .  Hemoglobin 9.3.  PICC line placed. 9/2.  Patient told me this morning that he wanted to go to acute inpatient rehab.  Dr. [name] changed the wound VAC in the operating room and he is okay with nursing staff changing moving forward. 9/3--I assumed care of patient. C/o itching over his body. Did not participate with PT today. Per vascular ok to d/c to rehab. CIR was denied by insurance 9/4-- sitting up with the recliner chair. Had a meaningful conversation. Discussed about discharge planning to rehab patient agreeable. Discussed with wife. Patient states he is been feeling a bit sleepy. I tried to minimize and discontinue sedating meds. Patient has history of sleep apnea he is in the process of getting CPAP at home 9/5-- patient and wife declined to rehab. They are going home. Pam from infusion therapy will do education with wife. Home health arranged. Wound VAC changed by wound nurse.     Assessment and Plan: Rupture of femoral artery (HCC) 8/14.  Patient had percutaneous transluminal angioplasty and stent placement of the right profunda femoris and common femoral artery extending into the distal right external iliac artery. 8/21.  Right external iliac artery to profunda femoris artery bypass with autologous ipsilateral superficial femoral artery, resection of infected right, femoral artery and removal of common femoral  artery, profunda femoris artery and superficial femoral artery stents.  Harvest of right superficial femoral artery for bypass with endarterectomy of the right superficial femoral artery. 8/27.  Wound VAC changed in the operating room 9/2.  Wound VAC changed in the operating room.  Dr. [name] gave clearance to follow-up as outpatient and have nursing staff change wound VAC moving forward. 9/5 wound vac changed today by WOC  RN.    Severe sepsis (HCC) --Secondary to Serratia.  Present on admission.   --Currently on Maxipime  as per ID.  -- Will need total of 4 to 6 weeks of IV antibiotics with ertapenem .  End date for IV abx 01/08/24 --PICC line placed on 9/1.    -- follow-up with Dr. Searcy.   Hemorrhagic shock (HCC) Resolved.   Chronic systolic CHF (congestive heart failure) (HCC) Last EF 20%, continue low-dose Coreg , enteresto,farxiga  and Aldactone  Pt to follow with [facility] cardiology Dr [name]   Uncontrolled type 2 diabetes mellitus with hyperglycemia, with long-term current use of insulin  (HCC) Cont present Insulin  dosing short and long acting  NSTEMI (non-ST elevated myocardial infarction) (HCC) Secondary to hemorrhagic shock.  Patient on aspirin , Plavix , Coreg  and statin.  Resumed enteresto and farxiga    PAD (peripheral artery disease) (HCC) Continue aspirin  and Plavix , history of right BKA.   AKI (acute kidney injury) (HCC) With metabolic acidosis.  Resolved.   NSVT (nonsustained ventricular tachycardia) (HCC) 8 beat run of nonsustained ventricular tachycardia yesterday.  Patient given IV magnesium .  Case discussed with cardiology and no further treatments.  Last EF 20 to 25%.    Hyperkalemia Resolved   Sleep apnea Will need outpatient sleep study.  With nocturnal hypoxia with overnight oximetry he qualifies for  oxygen.   History of alcohol  abuse On thiamine    Iron  deficiency anemia Last hemoglobin 9.3.  Received 3 units of blood during the hospital course. --resumed po iron        Procedures: as per vascular sx Family communication :wife [name] at bedside Consults : vascular, infectious disease CODE STATUS: DNR DVT Prophylaxis : Lovenox      Pain control - [place]  Controlled Substance Reporting System database was reviewed. and patient was instructed, not to drive, operate heavy machinery, perform activities at heights, swimming or participation in water  activities or  provide baby-sitting services while on Pain, Sleep and Anxiety Medications; until their outpatient Physician has advised to do so again. Also recommended to not to take more than prescribed Pain, Sleep and Anxiety Medications.   Disposition: Home health Diet recommendation:  Cardiac and Carb modified diet DISCHARGE MEDICATION: Allergies as of 12/26/2023   No Known Allergies      Medication List     TAKE these medications    artificial tears ophthalmic solution Place 1 drop into both eyes as needed for dry eyes.   aspirin  EC 81 MG tablet Take 1 tablet (81 mg total) by mouth daily.   atorvastatin  80 MG tablet Commonly known as: LIPITOR  Take 1 tablet (80 mg total) by mouth at bedtime.   carvedilol  25 MG tablet Commonly known as: COREG  Take 1 tablet (25 mg total) by mouth 2 (two) times daily with a meal.   clopidogrel  75 MG tablet Commonly known as: PLAVIX  Take 1 tablet (75 mg total) by mouth daily.   dapagliflozin  propanediol 10 MG Tabs tablet Commonly known as: FARXIGA  Take 1 tablet (10 mg total) by mouth daily.   Ensure Max Protein Liqd Take 330 mLs (11 oz total) by mouth 2 (two) times daily.  What changed: when to take this   ertapenem  IVPB Commonly known as: INVANZ  Inject 1 g into the vein daily for 14 days. Indication:  Serratia bacteremia and infected vascular graft First Dose: Yes Last Day of Therapy:  01/08/2024 Labs - Once weekly:  CBC/D and CMP Fax weekly lab results  promptly to [phone] Method of administration: Mini-Bag Plus / Gravity Method of administration may be changed at the discretion of home infusion pharmacist based upon assessment of the patient and/or caregiver's ability to self-administer the medication ordered. Please leave PIC in place until doctor has seen patient or been notified Call [phone] with nay questions or critical values   escitalopram  5 MG tablet Commonly known as: LEXAPRO  Take 5 mg by mouth daily.   ferrous sulfate   325 (65 FE) MG EC tablet Take 325 mg by mouth daily with breakfast.   fluticasone  50 MCG/ACT nasal spray Commonly known as: FLONASE  Place 1 spray into both nostrils 2 (two) times daily as needed for allergies or rhinitis.   folic acid  1 MG tablet Commonly known as: FOLVITE  Take 1 tablet (1 mg total) by mouth daily.   furosemide  40 MG tablet Commonly known as: LASIX  Take 1 tablet (40 mg total) by mouth daily.   gabapentin  100 MG capsule Commonly known as: NEURONTIN  1 po at bedtime.   HYDROcodone -acetaminophen  5-325 MG tablet Commonly known as: NORCO/VICODIN Take 1 tablet by mouth every 6 (six) hours as needed for moderate pain (pain score 4-6) or severe pain (pain score 7-10).   insulin  aspart protamine - aspart (70-30) 100 UNIT/ML injection Commonly known as: NOVOLOG  MIX 70/30 Inject 0.1 mLs (10 Units total) into the skin 2 (two) times daily with a meal. What changed: how much to take   loratadine  10 MG tablet Commonly known as: CLARITIN  Take 1 tablet (10 mg total) by mouth daily as needed for allergies.   methocarbamol  500 MG tablet Commonly known as: ROBAXIN  Take 500 mg by mouth every 8 (eight) hours as needed for muscle spasms.   nitroGLYCERIN  0.4 MG SL tablet Commonly known as: Nitrostat  Place 1 tablet (0.4 mg total) under the tongue every 5 (five) minutes as needed for chest pain. Max: 3 tablets per day   ondansetron  4 MG tablet Commonly known as: ZOFRAN  Take 1 tablet (4 mg total) by mouth every 6 (six) hours as needed for nausea.   pantoprazole  40 MG tablet Commonly known as: PROTONIX  Take 1 tablet (40 mg total) by mouth daily.   polyethylene glycol 17 g packet Commonly known as: MIRALAX  / GLYCOLAX  Take 17 g by mouth daily as needed for mild constipation.   sacubitril -valsartan  24-26 MG Commonly known as: ENTRESTO  Take 1 tablet by mouth every 12 (twelve) hours.   senna-docusate 8.6-50 MG tablet Commonly known as: Senokot-S Take 1 tablet by mouth at  bedtime as needed for mild constipation.   spironolactone  25 MG tablet Commonly known as: ALDACTONE  Take 1 tablet (25 mg total) by mouth daily.   thiamine  100 MG tablet Commonly known as: Vitamin B-1 Take 1 tablet (100 mg total) by mouth daily.   traZODone  50 MG tablet Commonly known as: DESYREL  Take 1 tablet (50 mg total) by mouth at bedtime as needed for sleep.               Durable Medical Equipment  (From admission, onward)           Start     Ordered   12/26/23 0916  For home use only DME  oxygen  Once       Question Answer Comment  Length of Need Lifetime   Mode or (Route) Nasal cannula   Liters per Minute 2   Frequency Continuous (stationary and portable oxygen unit needed)   Oxygen conserving device Yes   Oxygen delivery system Gas      12/26/23 0915   12/20/23 0940  For home use only DME oxygen  Once       Question Answer Comment  Length of Need Lifetime   Mode or (Route) Nasal cannula   Liters per Minute 2   Frequency Only at night (stationary unit needed)   Oxygen conserving device Yes   Oxygen delivery system Gas      12/20/23 0939              Discharge Care Instructions  (From admission, onward)           Start     Ordered   12/26/23 0000  Change dressing on IV access line weekly and PRN  (Home infusion instructions - [facility] )        12/26/23 0936            Follow-up Information     [facility]. Go on 01/07/2024.   Specialty: Cardiology Why: Hospital Follow-Please call us  to schedule a follow-up appointment in 1-2 weeks. (When you find out your Vascular follow-up appt we can schedule right before or after that). Please reach out to let us  know. Please bring all medications to follow-up appt [facility], [address], [employer] Parking at the door, As needed...    Go at 2:30pm. Contact information: [address] [place]  [place]  [address] [phone]        Onita Devere POUR, MD. Schedule an appointment as soon as possible for a visit in 1 week(s).   Specialty: Family Medicine Contact information: [address] [place] [address] [phone]         Schnier, Cordella MATSU, MD. Schedule an appointment as soon as possible for a visit in 10 day(s).   Specialties: Vascular Surgery, Cardiology, Radiology, Vascular Surgery Why: ruptured femoral artery post wound vac Contact information: [address] [place] [address] [phone]         [name] Bodily, MD. Go on 01/06/2024.   Specialty: Infectious Diseases Why: at 11:30 am Contact information: [address] [place] [address] [phone]                Discharge Exam: Fredricka Weights   12/11/23 1845 12/23/23 0652 12/24/23 0418  Weight: 93.4 kg 93.4 kg 92.9 kg  Right groin vac+   GENERAL:  78 y.o.-year-old patient with no acute distress. Chronically ill LUNGS: Normal breath sounds bilaterally, no wheezing CARDIOVASCULAR: S1, S2 normal. No murmur   ABDOMEN: Soft, nontender, nondistended. Bowel sounds present.  EXTREMITIES: right groin wound vac, right BKA/prosthetic NEUROLOGIC: nonfocal  patient is alert and awake Condition at discharge: fair  The results of significant diagnostics from this hospitalization (including imaging, microbiology, ancillary and laboratory) are listed below for reference.   Imaging Studies: US  EKG SITE RITE Result Date: 12/22/2023 If Site Rite image not attached, placement could not be confirmed due to current cardiac rhythm.  DG Abd 1 View Result Date: 12/12/2023 CLINICAL DATA:  OG tube placement. EXAM: ABDOMEN - 1 VIEW COMPARISON:  None Available. FINDINGS: OG tube tip is in the gastric  antrum. Gas is evident within the nondistended stomach. Scattered small bowel and colonic gas identified in the visualized upper abdomen, nonobstructive pattern. IMPRESSION: OG  tube tip is in the distal stomach. Electronically Signed   By: Camellia Candle M.D.   On: 12/12/2023 07:47   DG Chest Port 1 View Result Date: 12/12/2023 CLINICAL DATA:  Ventilator dependence. EXAM: PORTABLE CHEST 1 VIEW COMPARISON:  12/04/2023 FINDINGS: Endotracheal tube tip is 6.2 cm above the base of the carina. The NG tube passes into the stomach although the distal tip position is not included on the film. The cardio pericardial silhouette is enlarged. Retrocardiac left base collapse/consolidation is new in the interval, obscuring the left hemidiaphragm. Right lung clear. Telemetry leads overlie the chest. Degenerative changes are noted in both shoulders. IMPRESSION: 1. Interval development of retrocardiac left base collapse/consolidation. 2. Endotracheal tube tip is 6.2 cm above the base of the carina. Electronically Signed   By: Camellia Candle M.D.   On: 12/12/2023 07:47   ECHOCARDIOGRAM COMPLETE Result Date: 12/06/2023    ECHOCARDIOGRAM REPORT   Patient Name:   Manuel [name] Date of Exam: 12/05/2023 Medical Rec #:  [id number]        Height:       68.0 in Accession #:    [id number]       Weight:       180.0 lb Date of Birth:  1946-02-05        BSA:          1.954 m Patient Age:    78 years         BP:           81/51 mmHg Patient Gender: M                HR:           80 bpm. Exam Location:  [facility] Procedure: 2D Echo, Cardiac Doppler and Color Doppler (Both Spectral and Color            Flow Doppler were utilized during procedure). Indications:     Elevated Troponin  History:         Patient has prior history of Echocardiogram examinations, most                  recent 07/11/2023. CHF, Previous Myocardial Infarction and CAD,                  PAD; Risk Factors:Diabetes and Dyslipidemia.  Sonographer:     Thea Norlander RCS Referring Phys:  [id number] BRITTON L [place] Diagnosing Phys: Redell Cave MD IMPRESSIONS  1. Left ventricular ejection fraction, by estimation, is 20 to 25%. The left ventricle  has severely decreased function. The left ventricle demonstrates global hypokinesis. Left ventricular diastolic parameters are consistent with Grade I diastolic dysfunction (impaired relaxation).  2. Right ventricular systolic function is severely reduced. The right ventricular size is normal. There is normal pulmonary artery systolic pressure.  3. Left atrial size was mildly dilated.  4. The mitral valve is normal in structure. Mild mitral valve regurgitation.  5. The aortic valve is tricuspid. Aortic valve regurgitation is not visualized. Aortic valve sclerosis/calcification is present, without any evidence of aortic stenosis.  6. Aortic dilatation noted. There is mild dilatation of the ascending aorta, measuring 39 mm.  7. The inferior vena cava is dilated in size with <50% respiratory variability, suggesting right atrial pressure of 15 mmHg. FINDINGS  Left Ventricle: Left ventricular ejection fraction, by estimation,  is 20 to 25%. The left ventricle has severely decreased function. The left ventricle demonstrates global hypokinesis. The left ventricular internal cavity size was normal in size. There is no left ventricular hypertrophy. Left ventricular diastolic parameters are consistent with Grade I diastolic dysfunction (impaired relaxation). Right Ventricle: The right ventricular size is normal. No increase in right ventricular wall thickness. Right ventricular systolic function is severely reduced. There is normal pulmonary artery systolic pressure. The tricuspid regurgitant velocity is 2.14 m/s, and with an assumed right atrial pressure of 15 mmHg, the estimated right ventricular systolic pressure is 33.3 mmHg. Left Atrium: Left atrial size was mildly dilated. Right Atrium: Right atrial size was normal in size. Pericardium: There is no evidence of pericardial effusion. Mitral Valve: The mitral valve is normal in structure. Mild mitral valve regurgitation. Tricuspid Valve: The tricuspid valve is normal in  structure. Tricuspid valve regurgitation is not demonstrated. Aortic Valve: The aortic valve is tricuspid. Aortic valve regurgitation is not visualized. Aortic valve sclerosis/calcification is present, without any evidence of aortic stenosis. Aortic valve peak gradient measures 2.8 mmHg. Pulmonic Valve: The pulmonic valve was not well visualized. Pulmonic valve regurgitation is not visualized. Aorta: Aortic dilatation noted and the aortic root is normal in size and structure. There is mild dilatation of the ascending aorta, measuring 39 mm. Venous: The inferior vena cava is dilated in size with less than 50% respiratory variability, suggesting right atrial pressure of 15 mmHg. IAS/Shunts: No atrial level shunt detected by color flow Doppler.  LEFT VENTRICLE PLAX 2D LVIDd:         5.10 cm   Diastology LVIDs:         4.60 cm   LV e' medial:    3.32 cm/s LV PW:         0.90 cm   LV E/e' medial:  17.0 LV IVS:        1.00 cm   LV e' lateral:   7.14 cm/s LVOT diam:     2.10 cm   LV E/e' lateral: 7.9 LV SV:         36 LV SV Index:   19 LVOT Area:     3.46 cm  RIGHT VENTRICLE            IVC RV S prime:     4.22 cm/s  IVC diam: 2.40 cm LEFT ATRIUM             Index        RIGHT ATRIUM           Index LA diam:        4.50 cm 2.30 cm/m   RA Area:     17.30 cm LA Vol (A2C):   40.3 ml 20.62 ml/m  RA Volume:   46.50 ml  23.79 ml/m LA Vol (A4C):   66.4 ml 33.98 ml/m LA Biplane Vol: 55.0 ml 28.14 ml/m  AORTIC VALVE AV Area (Vmax): 2.68 cm AV Vmax:        83.40 cm/s AV Peak Grad:   2.8 mmHg LVOT Vmax:      64.50 cm/s LVOT Vmean:     40.400 cm/s LVOT VTI:       0.105 m  AORTA Ao Root diam: 3.50 cm Ao Asc diam:  3.90 cm MITRAL VALVE               TRICUSPID VALVE MV Area (PHT): 4.68 cm    TR Peak grad:   18.3 mmHg MV Decel Time:  162 msec    TR Vmax:        214.00 cm/s MR Peak grad: 25.4 mmHg MR Vmax:      252.00 cm/s  SHUNTS MV E velocity: 56.60 cm/s  Systemic VTI:  0.10 m MV A velocity: 74.90 cm/s  Systemic Diam: 2.10 cm MV  E/A ratio:  0.76 Redell Cave MD Electronically signed by Redell Cave MD Signature Date/Time: 12/06/2023/3:24:18 PM    Final    US  RENAL Result Date: 12/05/2023 CLINICAL DATA:  Acute kidney injury. EXAM: RENAL / URINARY TRACT ULTRASOUND COMPLETE COMPARISON:  None Available. FINDINGS: Right Kidney: Renal measurements: 12.3 cm x 5.6 cm x 6.6 cm = volume: 237.0 mL. Echogenicity within normal limits. No mass or hydronephrosis visualized. Left Kidney: Renal measurements: 11.7 cm x 5.8 cm x 5.4 cm = volume: 192.8 mL. Echogenicity within normal limits. No mass or hydronephrosis visualized. Bladder: Appears normal for degree of bladder distention. Other: None. IMPRESSION: Unremarkable renal ultrasound. Electronically Signed   By: Suzen Dials M.D.   On: 12/05/2023 20:17   PERIPHERAL VASCULAR CATHETERIZATION Result Date: 12/04/2023 See surgical note for result.  CT CHEST ABDOMEN PELVIS W CONTRAST Result Date: 12/04/2023 CLINICAL DATA:  Sepsis.  Fall. EXAM: CT CHEST, ABDOMEN, AND PELVIS WITH CONTRAST TECHNIQUE: Multidetector CT imaging of the chest, abdomen and pelvis was performed following the standard protocol during bolus administration of intravenous contrast. RADIATION DOSE REDUCTION: This exam was performed according to the departmental dose-optimization program which includes automated exposure control, adjustment of the mA and/or kV according to patient size and/or use of iterative reconstruction technique. CONTRAST:  OMNIPAQUE  IOHEXOL  300 MG/ML  SOLN COMPARISON:  CT abdomen and pelvis 06/16/2021. FINDINGS: CT CHEST FINDINGS Cardiovascular: The heart is mildly enlarged. There is no pericardial effusion. There are atherosclerotic calcifications of the aorta and coronary arteries. Aorta is normal in size. Mediastinum/Nodes: No enlarged mediastinal, hilar, or axillary lymph nodes. Thyroid gland, trachea, and esophagus demonstrate no significant findings. Lungs/Pleura: There are mild patchy  peripheral ground-glass opacities in the anterior right upper lobe with some focal pleural thickening. There are calcified granulomas bilaterally. The lungs are otherwise clear. There is no pleural effusion or pneumothorax. Musculoskeletal: There is some expansile cystic changes in the right glenoid and non expansile cystic changes in the left lung is cleared these are incompletely imaged. No other focal osseous lesions are identified. There is no acute fracture or dislocation identified. Multilevel degenerative changes affect the spine. There are healed right fourth through seventh rib fractures. CT ABDOMEN PELVIS FINDINGS Hepatobiliary: There is diffuse fatty infiltration of the liver. There is gallbladder wall edema. No radiopaque calculi are seen. There is no biliary ductal dilatation. Pancreas: Unremarkable. No pancreatic ductal dilatation or surrounding inflammatory changes. Spleen: Spleen is mildly enlarged. Adrenals/Urinary Tract: There is a 3 mm calculus in the distal right ureter. There is no right-sided hydronephrosis. Otherwise, the bilateral kidneys, adrenal glands and bladder are within normal limits. Stomach/Bowel: Stomach is within normal limits. Appendix appears normal. No evidence of bowel wall thickening, distention, or inflammatory changes. There is diffuse colonic diverticulosis, most significant in the sigmoid colon and descending colon. Vascular/Lymphatic: Infrarenal abdominal aortic aneurysm measures 3.2 cm similar to prior. There is severe atherosclerotic calcifications of the aorta. IVC is normal in size. There is a vascular stent in the left external iliac artery. There is hematoma surrounding the right common femoral artery measuring 6.6 x 4.9 by 7.0 cm. There is active extravasation of a large amount of contrast into the  central hematoma with source at the anterior margin of the right common femoral artery. There is some scarring surrounding the left common femoral artery. No enlarged  lymph nodes are identified. Reproductive: Prostate gland is mildly enlarged. Other: There is a small fat containing umbilical hernia. There is no ascites. There is no retroperitoneal hematoma. Musculoskeletal: There is severe degenerative changes throughout the spine. Chronic L1 compression fracture is unchanged. IMPRESSION: 1. Large hematoma surrounding the right common femoral artery with active arterial extravasation of contrast into the hematoma. 2. 3.2 cm infrarenal abdominal aortic aneurysm. Recommend follow-up every 3 years. 3. Mild patchy peripheral ground-glass opacities in the anterior right upper lobe with some focal pleural thickening. Findings are nonspecific and may be infectious/inflammatory. 4. Gallbladder wall edema. No radiopaque calculi are seen. Correlate clinically for cholecystitis. 5. Nonobstructing 3 mm calculus in the distal right ureter. 6. Colonic diverticulosis. 7. Fatty infiltration of the liver. 8. Mild splenomegaly. Aortic Atherosclerosis (ICD10-I70.0). These results were called by telephone at the time of interpretation on 12/04/2023 at 5:49 pm to provider [facility] , who verbally acknowledged these results. Electronically Signed   By: Greig Pique M.D.   On: 12/04/2023 17:49   CT Head Wo Contrast Result Date: 12/04/2023 CLINICAL DATA:  Status post fall. EXAM: CT HEAD WITHOUT CONTRAST TECHNIQUE: Contiguous axial images were obtained from the base of the skull through the vertex without intravenous contrast. RADIATION DOSE REDUCTION: This exam was performed according to the departmental dose-optimization program which includes automated exposure control, adjustment of the mA and/or kV according to patient size and/or use of iterative reconstruction technique. COMPARISON:  April 09, 2021 FINDINGS: Brain: There is generalized cerebral atrophy with widening of the extra-axial spaces and ventricular dilatation. There are areas of decreased attenuation within the white matter  tracts of the supratentorial brain, consistent with microvascular disease changes. Vascular: Marked severity bilateral cavernous carotid artery calcification is noted. Skull: Normal. Negative for fracture or focal lesion. Sinuses/Orbits: No acute finding. Other: None. IMPRESSION: 1. Generalized cerebral atrophy and microvascular disease changes of the supratentorial brain. 2. No acute intracranial abnormality. Electronically Signed   By: Suzen Dials M.D.   On: 12/04/2023 17:44   DG Shoulder Right Result Date: 12/04/2023 CLINICAL DATA:  Status post fall. EXAM: RIGHT SHOULDER - 2+ VIEW COMPARISON:  None Available. FINDINGS: There is no evidence of an acute fracture or dislocation. A chronic appearing deformity is seen along the inferior medial aspect of the right humeral head and neck. Multiple chronic right-sided rib fractures are noted. Marked severity degenerative changes are seen involving the right glenohumeral joint. Extensive chronic and degenerative changes are also present involving the right glenohumeral joint, in the form of joint space narrowing and extensive humeral and glenoid subchondral cyst formation. Soft tissues are unremarkable. IMPRESSION: Chronic and degenerative changes, as described above, without an acute osseous abnormality. Electronically Signed   By: Suzen Dials M.D.   On: 12/04/2023 17:41   CT Cervical Spine Wo Contrast Result Date: 12/04/2023 CLINICAL DATA:  Status post fall. EXAM: CT CERVICAL SPINE WITHOUT CONTRAST TECHNIQUE: Multidetector CT imaging of the cervical spine was performed without intravenous contrast. Multiplanar CT image reconstructions were also generated. RADIATION DOSE REDUCTION: This exam was performed according to the departmental dose-optimization program which includes automated exposure control, adjustment of the mA and/or kV according to patient size and/or use of iterative reconstruction technique. COMPARISON:  None Available. FINDINGS:  Alignment: There is straightening of the normal cervical spine lordosis. Approximately 2 mm anterolisthesis of  the C4 vertebral body is noted on C5. Skull base and vertebrae: No acute fracture. No primary bone lesion or focal pathologic process. Chronic and degenerative changes are seen involving the body and tip of the dens, as well as the adjacent portion of the anterior arch of C1. Soft tissues and spinal canal: No prevertebral fluid or swelling. No visible canal hematoma. Disc levels: Marked severity endplate sclerosis, anterior osteophyte formation and posterior bony spurring are seen at the levels of C4-C5, C5-C6, C6-C7 and C7-T1. Posterior bony spurring is also present at the level of C3-C4. There is marked severity narrowing of the anterior atlantoaxial articulation. Marked severity intervertebral disc space narrowing is seen at C4-C5, C5-C6, C6-C7 and C7-T1. Bilateral marked severity multilevel facet joint hypertrophy is noted. Upper chest: Negative. Other: None. IMPRESSION: 1. No acute fracture or traumatic subluxation. 2. Marked severity multilevel degenerative changes, as described above. Electronically Signed   By: Suzen Dials M.D.   On: 12/04/2023 17:36   DG Chest 2 View Result Date: 12/04/2023 CLINICAL DATA:  Weakness. EXAM: DG CHEST 2V COMPARISON:  07/09/2023. FINDINGS: Stable cardiomegaly. Aortic atherosclerosis. No focal consolidation, pleural effusion, or pneumothorax. Remote healed right-sided rib fractures. Advanced degenerative changes of the bilateral glenohumeral joints. No acute osseous abnormality identified. IMPRESSION: 1. No acute cardiopulmonary findings. 2. Stable cardiomegaly. 3.  Aortic Atherosclerosis (ICD10-I70.0). Electronically Signed   By: Harrietta Sherry M.D.   On: 12/04/2023 12:34    Microbiology: Results for orders placed or performed during the hospital encounter of 12/04/23  Resp panel by RT-PCR (RSV, Flu A&B, Covid) Anterior Nasal Swab     Status: None    Collection Time: 12/04/23  3:48 PM   Specimen: Anterior Nasal Swab  Result Value Ref Range Status   SARS Coronavirus 2 by RT PCR NEGATIVE NEGATIVE Final    Comment: (NOTE) SARS-CoV-2 target nucleic acids are NOT DETECTED.  The SARS-CoV-2 RNA is generally detectable in upper respiratory specimens during the acute phase of infection. The lowest concentration of SARS-CoV-2 viral copies this assay can detect is 138 copies/mL. A negative result does not preclude SARS-Cov-2 infection and should not be used as the sole basis for treatment or other patient management decisions. A negative result may occur with  improper specimen collection/handling, submission of specimen other than nasopharyngeal swab, presence of viral mutation(s) within the areas targeted by this assay, and inadequate number of viral copies(<138 copies/mL). A negative result must be combined with clinical observations, patient history, and epidemiological information. The expected result is Negative.  Fact Sheet for Patients:  BloggerCourse.com  Fact Sheet for Healthcare Providers:  SeriousBroker.it  This test is no t yet approved or cleared by the [place]  FDA and  has been authorized for detection and/or diagnosis of SARS-CoV-2 by FDA under an Emergency Use Authorization (EUA). This EUA will remain  in effect (meaning this test can be used) for the duration of the COVID-19 declaration under Section 564(b)(1) of the Act, 21 U.S.C.section 360bbb-3(b)(1), unless the authorization is terminated  or revoked sooner.       Influenza A by PCR NEGATIVE NEGATIVE Final   Influenza B by PCR NEGATIVE NEGATIVE Final    Comment: (NOTE) The Xpert Xpress SARS-CoV-2/FLU/RSV plus assay is intended as an aid in the diagnosis of influenza from Nasopharyngeal swab specimens and should not be used as a sole basis for treatment. Nasal washings and aspirates are unacceptable for  Xpert Xpress SARS-CoV-2/FLU/RSV testing.  Fact Sheet for Patients: BloggerCourse.com  Fact Sheet for Healthcare  Providers: SeriousBroker.it  This test is not yet approved or cleared by the [place]  FDA and has been authorized for detection and/or diagnosis of SARS-CoV-2 by FDA under an Emergency Use Authorization (EUA). This EUA will remain in effect (meaning this test can be used) for the duration of the COVID-19 declaration under Section 564(b)(1) of the Act, 21 U.S.C. section 360bbb-3(b)(1), unless the authorization is terminated or revoked.     Resp Syncytial Virus by PCR NEGATIVE NEGATIVE Final    Comment: (NOTE) Fact Sheet for Patients: BloggerCourse.com  Fact Sheet for Healthcare Providers: SeriousBroker.it  This test is not yet approved or cleared by the [place]  FDA and has been authorized for detection and/or diagnosis of SARS-CoV-2 by FDA under an Emergency Use Authorization (EUA). This EUA will remain in effect (meaning this test can be used) for the duration of the COVID-19 declaration under Section 564(b)(1) of the Act, 21 U.S.C. section 360bbb-3(b)(1), unless the authorization is terminated or revoked.  Performed at [facility], [address] [place], [place] [address]   Culture, blood (routine x 2)     Status: Abnormal   Collection Time: 12/04/23  3:48 PM   Specimen: BLOOD  Result Value Ref Range Status   Specimen Description   Final    BLOOD BLOOD RIGHT ARM Performed at [facility], [address] [place], [place] [address]    Special Requests   Final    BOTTLES DRAWN AEROBIC AND ANAEROBIC Blood Culture adequate volume Performed at [facility], [address], [place], [place] [address]    Culture  Setup Time   Final    GRAM NEGATIVE RODS IN BOTH AEROBIC AND ANAEROBIC BOTTLES CRITICAL RESULT CALLED TO, READ  BACK BY AND VERIFIED WITH: JASON ROBBINS @0700  12/05/23 MJU    Culture SERRATIA MARCESCENS (A)  Final   Report Status 12/07/2023 FINAL  Final   Organism ID, Bacteria SERRATIA MARCESCENS  Final      Susceptibility   Serratia marcescens - MIC*    CEFEPIME  <=0.12 SENSITIVE Sensitive     ERTAPENEM  <=0.12 SENSITIVE Sensitive     CEFTRIAXONE  1 SENSITIVE Sensitive     CIPROFLOXACIN  0.12 SENSITIVE Sensitive     GENTAMICIN  <=1 SENSITIVE Sensitive     MEROPENEM <=0.25 SENSITIVE Sensitive     TRIMETH /SULFA  <=20 SENSITIVE Sensitive     AMIKACIN 4 SENSITIVE Sensitive     CEFTAZIDIME/AVIBACTAM <=0.12 SENSITIVE Sensitive ug/mL    MEROPENEM/VABORBACTAM <=0.5 SENSITIVE Sensitive ug/mL    TOBRAMYCIN 4 SENSITIVE Sensitive     * SERRATIA MARCESCENS  Culture, blood (routine x 2)     Status: Abnormal   Collection Time: 12/04/23  3:48 PM   Specimen: BLOOD  Result Value Ref Range Status   Specimen Description   Final    BLOOD BLOOD LEFT ARM Performed at [facility], [address] [place], [place] [address]    Special Requests   Final    BOTTLES DRAWN AEROBIC AND ANAEROBIC Blood Culture adequate volume Performed at [facility], [address] [place], [place] [address]    Culture  Setup Time   Final    IN BOTH AEROBIC AND ANAEROBIC BOTTLES GRAM NEGATIVE RODS CRITICAL RESULT CALLED TO, READ BACK BY AND VERIFIED WITH: JASON ROBBINS @0700  12/05/23 MJU    Culture (A)  Final    SERRATIA MARCESCENS SUSCEPTIBILITIES PERFORMED ON PREVIOUS CULTURE WITHIN THE LAST 5 DAYS. Performed at [facility], [address], [place], [place] [address]  Report Status 12/07/2023 FINAL  Final  Blood Culture ID Panel (Reflexed)     Status: Abnormal   Collection Time: 12/04/23  3:48 PM  Result Value Ref Range Status   Enterococcus faecalis NOT DETECTED NOT DETECTED Final   Enterococcus Faecium NOT DETECTED NOT DETECTED Final   Listeria monocytogenes NOT DETECTED NOT DETECTED Final    Staphylococcus species NOT DETECTED NOT DETECTED Final   Staphylococcus aureus (BCID) NOT DETECTED NOT DETECTED Final   Staphylococcus epidermidis NOT DETECTED NOT DETECTED Final   Staphylococcus lugdunensis NOT DETECTED NOT DETECTED Final   Streptococcus species NOT DETECTED NOT DETECTED Final   Streptococcus agalactiae NOT DETECTED NOT DETECTED Final   Streptococcus pneumoniae NOT DETECTED NOT DETECTED Final   Streptococcus pyogenes NOT DETECTED NOT DETECTED Final   A.calcoaceticus-baumannii NOT DETECTED NOT DETECTED Final   Bacteroides fragilis NOT DETECTED NOT DETECTED Final   Enterobacterales DETECTED (A) NOT DETECTED Final    Comment: Enterobacterales represent a large order of gram negative bacteria, not a single organism. CRITICAL RESULT CALLED TO, READ BACK BY AND VERIFIED WITH: JASON ROBBINS @0700  12/05/23 MJU    Enterobacter cloacae complex NOT DETECTED NOT DETECTED Final   Escherichia coli NOT DETECTED NOT DETECTED Final   Klebsiella aerogenes NOT DETECTED NOT DETECTED Final   Klebsiella oxytoca NOT DETECTED NOT DETECTED Final   Klebsiella pneumoniae NOT DETECTED NOT DETECTED Final   Proteus species NOT DETECTED NOT DETECTED Final   Salmonella species NOT DETECTED NOT DETECTED Final   Serratia marcescens DETECTED (A) NOT DETECTED Final    Comment: CRITICAL RESULT CALLED TO, READ BACK BY AND VERIFIED WITH: JASON ROBBINS @0700  12/05/23 MJU    Haemophilus influenzae NOT DETECTED NOT DETECTED Final   Neisseria meningitidis NOT DETECTED NOT DETECTED Final   Pseudomonas aeruginosa NOT DETECTED NOT DETECTED Final   Stenotrophomonas maltophilia NOT DETECTED NOT DETECTED Final   Candida albicans NOT DETECTED NOT DETECTED Final   Candida auris NOT DETECTED NOT DETECTED Final   Candida glabrata NOT DETECTED NOT DETECTED Final   Candida krusei NOT DETECTED NOT DETECTED Final   Candida parapsilosis NOT DETECTED NOT DETECTED Final   Candida tropicalis NOT DETECTED NOT DETECTED Final    Cryptococcus neoformans/gattii NOT DETECTED NOT DETECTED Final   CTX-M ESBL NOT DETECTED NOT DETECTED Final   Carbapenem resistance IMP NOT DETECTED NOT DETECTED Final   Carbapenem resistance KPC NOT DETECTED NOT DETECTED Final   Carbapenem resistance NDM NOT DETECTED NOT DETECTED Final   Carbapenem resist OXA 48 LIKE NOT DETECTED NOT DETECTED Final   Carbapenem resistance VIM NOT DETECTED NOT DETECTED Final    Comment: Performed at [facility], [address] [place], [place] [address]  MRSA Next Gen by PCR, Nasal     Status: None   Collection Time: 12/04/23  9:30 PM   Specimen: Nasal Mucosa; Nasal Swab  Result Value Ref Range Status   MRSA by PCR Next Gen NOT DETECTED NOT DETECTED Final    Comment: (NOTE) The GeneXpert MRSA Assay (FDA approved for NASAL specimens only), is one component of a comprehensive MRSA colonization surveillance program. It is not intended to diagnose MRSA infection nor to guide or monitor treatment for MRSA infections. Test performance is not FDA approved in patients less than 34 years old. Performed at [facility], [address] [place], [place] [address]   Culture, blood (Routine X 2) w Reflex to ID Panel     Status: None   Collection Time: 12/08/23  2:23 PM  Specimen: BLOOD  Result Value Ref Range Status   Specimen Description BLOOD BLOOD RIGHT HAND  Final   Special Requests   Final    BOTTLES DRAWN AEROBIC AND ANAEROBIC Blood Culture adequate volume   Culture   Final    NO GROWTH 5 DAYS Performed at [facility], [address] [place], [place] [address]    Report Status 12/13/2023 FINAL  Final  Culture, blood (Routine X 2) w Reflex to ID Panel     Status: None   Collection Time: 12/08/23  2:24 PM   Specimen: BLOOD RIGHT HAND  Result Value Ref Range Status   Specimen Description BLOOD RIGHT HAND  Final   Special Requests   Final    AEROBIC BOTTLE ONLY Blood Culture results may not be optimal due to an  inadequate volume of blood received in culture bottles   Culture   Final    NO GROWTH 5 DAYS Performed at [facility], [address], [place], [place] [address]    Report Status 12/13/2023 FINAL  Final  Aerobic/Anaerobic Culture w Gram Stain (surgical/deep wound)     Status: None   Collection Time: 12/11/23  2:09 PM   Specimen: PATH Soft tissue  Result Value Ref Range Status   Specimen Description TISSUE  Final   Special Requests RIGHT GROIN HEMOTOMA  Final   Gram Stain   Final    FEW WBC PRESENT, PREDOMINANTLY PMN NO ORGANISMS SEEN    Culture   Final    FEW SERRATIA MARCESCENS NO ANAEROBES ISOLATED Performed at [facility], [address] [place], [place] [address]    Report Status 12/16/2023 FINAL  Final   Organism ID, Bacteria SERRATIA MARCESCENS  Final      Susceptibility   Serratia marcescens - MIC*    CEFEPIME  <=0.12 SENSITIVE Sensitive     ERTAPENEM  <=0.12 SENSITIVE Sensitive     CEFTRIAXONE  1 SENSITIVE Sensitive     CIPROFLOXACIN  0.12 SENSITIVE Sensitive     GENTAMICIN  <=1 SENSITIVE Sensitive     MEROPENEM <=0.25 SENSITIVE Sensitive     TRIMETH /SULFA  <=20 SENSITIVE Sensitive     * FEW SERRATIA MARCESCENS  Aerobic/Anaerobic Culture w Gram Stain (surgical/deep wound)     Status: None   Collection Time: 12/11/23  3:32 PM   Specimen: PATH Soft tissue  Result Value Ref Range Status   Specimen Description TISSUE  Final   Special Requests LEFT SFA THROMBOSIS AND BOVINE PATCH  Final   Gram Stain NO WBC SEEN NO ORGANISMS SEEN   Final   Culture   Final    RARE SERRATIA MARCESCENS NO ANAEROBES ISOLATED Performed at [facility], [address] [place], [place] [address]    Report Status 12/16/2023 FINAL  Final   Organism ID, Bacteria SERRATIA MARCESCENS  Final      Susceptibility   Serratia marcescens - MIC*    CEFEPIME  <=0.12 SENSITIVE Sensitive     ERTAPENEM  <=0.12 SENSITIVE Sensitive     CEFTRIAXONE  8 RESISTANT Resistant      CIPROFLOXACIN  0.12 SENSITIVE Sensitive     GENTAMICIN  <=1 SENSITIVE Sensitive     MEROPENEM <=0.25 SENSITIVE Sensitive     TRIMETH /SULFA  <=20 SENSITIVE Sensitive     * RARE SERRATIA MARCESCENS  Gastrointestinal Panel by PCR , Stool     Status: None   Collection Time: 12/19/23  8:28 AM   Specimen: Stool  Result Value Ref Range Status   Campylobacter species NOT DETECTED NOT DETECTED  Final   Plesimonas shigelloides NOT DETECTED NOT DETECTED Final   Salmonella species NOT DETECTED NOT DETECTED Final   Yersinia enterocolitica NOT DETECTED NOT DETECTED Final   Vibrio species NOT DETECTED NOT DETECTED Final   Vibrio cholerae NOT DETECTED NOT DETECTED Final   Enteroaggregative E coli (EAEC) NOT DETECTED NOT DETECTED Final   Enteropathogenic E coli (EPEC) NOT DETECTED NOT DETECTED Final   Enterotoxigenic E coli (ETEC) NOT DETECTED NOT DETECTED Final   Shiga like toxin producing E coli (STEC) NOT DETECTED NOT DETECTED Final   Shigella/Enteroinvasive E coli (EIEC) NOT DETECTED NOT DETECTED Final   Cryptosporidium NOT DETECTED NOT DETECTED Final   Cyclospora cayetanensis NOT DETECTED NOT DETECTED Final   Entamoeba histolytica NOT DETECTED NOT DETECTED Final   Giardia lamblia NOT DETECTED NOT DETECTED Final   Adenovirus F40/41 NOT DETECTED NOT DETECTED Final   Astrovirus NOT DETECTED NOT DETECTED Final   Norovirus GI/GII NOT DETECTED NOT DETECTED Final   Rotavirus A NOT DETECTED NOT DETECTED Final   Sapovirus (I, II, IV, and V) NOT DETECTED NOT DETECTED Final    Comment: Performed at [facility], [address] [place], [place] [address]    Labs: CBC: Recent Labs  Lab 12/20/23 0541 12/22/23 0519  WBC 6.2 6.9  HGB 8.8* 9.3*  HCT 27.4* 28.6*  MCV 91.9 91.4  PLT 220 217   Basic Metabolic Panel: Recent Labs  Lab 12/20/23 0541 12/22/23 0519  NA 135 134*  K 4.5 4.6  CL 108 106  CO2 22 24  GLUCOSE 79 135*  BUN 31* 23  CREATININE 0.78 0.77  CALCIUM  8.5* 8.5*    CBG: Recent Labs  Lab 12/24/23 2148 12/25/23 0732 12/25/23 1202 12/25/23 1700 12/25/23 2123  GLUCAP 130* 81 126* 121* 119*    Discharge time spent: greater than 30 minutes.  Signed: [name], MD [facility] 12/26/2023

## 2023-12-26 NOTE — Progress Notes (Signed)
 Patient C/O being itchy from the top of his head to his ABD. No swelling or redness noted. Patient requested his benadryl , somewhat effective. Refused 0400 VS. States he hasn't slept all night and just now falling asleep. Wife at bedside. Foley care and CHG completed.

## 2023-12-26 NOTE — Progress Notes (Signed)
 Date of Admission:  12/04/2023      ID: Manuel [name] is a 78 y.o. male  Principal Problem:   Rupture of femoral artery (HCC) Active Problems:   PAD (peripheral artery disease) (HCC)   Chronic systolic CHF (congestive heart failure) (HCC)   AKI (acute kidney injury) (HCC)   Hyponatremia   Uncontrolled type 2 diabetes mellitus with hyperglycemia, with long-term current use of insulin  (HCC)   NSTEMI (non-ST elevated myocardial infarction) (HCC)   Ischemic cardiomyopathy   Coronary artery disease involving native coronary artery of native heart   Serratia infection   Vascular graft infection (HCC)   Hemorrhagic shock (HCC)   Severe sepsis (HCC)   Iron  deficiency anemia   History of alcohol  abuse   Sleep apnea   Hyperkalemia   Gram-negative bacteremia   Diarrhea   NSVT (nonsustained ventricular tachycardia) (HCC)    Subjective: Patient is doing well He says wound vac changed today Was denied SNF He is going home  Medications:   aspirin  EC  81 mg Oral Daily   atorvastatin   80 mg Oral QHS   carvedilol   3.125 mg Oral BID WC   Chlorhexidine  Gluconate Cloth  6 each Topical Daily   clopidogrel   75 mg Oral Daily   enoxaparin  (LOVENOX ) injection  40 mg Subcutaneous Q24H   escitalopram   5 mg Oral Daily   feeding supplement (GLUCERNA SHAKE)  237 mL Oral TID BM   ferrous sulfate   325 mg Oral Q breakfast   folic acid   1 mg Oral Daily   gabapentin   100 mg Oral QHS   insulin  aspart  0-15 Units Subcutaneous TID WC & HS   insulin  glargine  5 Units Subcutaneous QHS   losartan   25 mg Oral QHS   pantoprazole   40 mg Oral Daily   polyethylene glycol  17 g Oral Daily   sodium chloride  flush  10-40 mL Intracatheter Q12H   sodium chloride  flush  3 mL Intravenous Q12H   spironolactone   25 mg Oral Daily   thiamine   100 mg Oral Daily    Objective: Vital signs in last 24 hours: BP 111/65 (BP Location: Left Arm)   Pulse (!) 58   Temp (!) 97.5 F (36.4 C)   Resp 18   Ht 5'  8 (1.727 m)   Wt 92.9 kg   SpO2 99%   BMI 31.14 kg/m        PHYSICAL EXAM:  General: awake, alert, pale Lungs: b/l air entry Heart: s1s2 Extremities: Right groin area has wound VAC Right BKA Neurologic: moves all limbs  Lab Results    Latest Ref Rng & Units 12/22/2023    5:19 AM 12/20/2023    5:41 AM 12/18/2023    3:45 AM  CBC  WBC 4.0 - 10.5 K/uL 6.9  6.2  10.3   Hemoglobin 13.0 - 17.0 g/dL 9.3  8.8  89.7   Hematocrit 39.0 - 52.0 % 28.6  27.4  31.5   Platelets 150 - 400 K/uL 217  220  254        Latest Ref Rng & Units 12/22/2023    5:19 AM 12/20/2023    5:41 AM 12/17/2023    4:47 AM  CMP  Glucose 70 - 99 mg/dL 864  79  84   BUN 8 - 23 mg/dL 23  31  21    Creatinine 0.61 - 1.24 mg/dL 9.22  9.21  9.24   Sodium 135 - 145 mmol/L 134  135  136   Potassium 3.5 - 5.1 mmol/L 4.6  4.5  4.8   Chloride 98 - 111 mmol/L 106  108  105   CO2 22 - 32 mmol/L 24  22  26    Calcium  8.9 - 10.3 mg/dL 8.5  8.5  9.0       Microbiology: 12/04/23 BC X 2 serratia 11/28/23 BC- NG  so far WC- x 2 serratia One of which is ceftriaxone  resistant   Assessment/Plan:  Right common femoral artery graft with rupture and leaking into the cutaneous tissue forming a hematoma.  Was taken for surgery underwent recent excision of the old graft and portions of the femoral artery and has a bypass graft with autologous external femoral artery.  Multiple cultures have been sent. Gram neg rod in gram stain  Serratia bacteremia.  Source is  the right groin bovine graft.  S/p removal of the graft Repeat blood culture neg  Patient is on ertapenem  after being on cefepime . Ertapenem  is once a day and easy to administer at home Will need 6 weeks of IV ( not 4 weeks like mentioned before) PO quinolone not an option because of femoral artery rupture  Underwent Right external iliac artery to profunda femoris bypass with autologous ipsilateral superficial femoral artery.  Resection of infected right common femoral artery  and removal of common femoral artery profunda femoris and superficial femoral artery stents. Negative pressure dressing. Surgical Cultures X2  serratia   Anemia  Severe PAD History of bilateral endarterectomies of the right And left     Right BKA   CAD Diabetes mellitus  Discussed the management with patient and wife and pharmacist OPAT Orders Discharge antibiotics: Ertapenem  1 g IV every 24 hours    Duration: 4 weeks may extend  depending on the healing of the wound End Date: 01/22/24     [facility] Care Per Protocol:   Labs weekly while on IV antibiotics: X__ CBC with differential   _X_ CMP       _X_ Please leave PIC in place until doctor has seen patient or been notified   Fax weekly lab results  promptly to [phone]   Clinic Follow Up Appt: 01/06/24 at 11.30 am with Dr.Annmarie Plemmons     Call [phone] with nay questions or critical values

## 2023-12-26 NOTE — Progress Notes (Signed)
 This RN spoke with on call MD for vascular Dr. Elnor. Dr. Elnor was explained the situation with discharge and patient/family concerns about being discharged. Dr. Elnor recommended discharge be canceled for tonight. Erminio Cone notified of Dr. Elnor recommendation. Discharge was canceled for tonight. Wife and patient notified in person about the discharge being canceled. Wife expressed appreciation.

## 2023-12-26 NOTE — Telephone Encounter (Signed)
 Copied from CRM [id number]. Topic: General - Call Back - No Documentation >> Dec 26, 2023  2:57 PM Avram G wrote: Reason for CRM: I was told to inform the patient, tara will give her a callback. Please advise [phone]

## 2023-12-26 NOTE — Plan of Care (Signed)

## 2023-12-26 NOTE — Consult Note (Addendum)
 WOC Nurse Consult Note: Vascular team is following for assessment and plan of care.  WOC team requested to change Vac dressing today.  Right groin full thickness post-op wound is beefy red, small amt pink drainage, 12X6X2.5cm.  Applied barrier ring to inner wound edges to attempt to maintain a seal in the skin fold, then 1 piece black foam to cont suction.  Pt was medicated for pain prior to the procedure and tolerated with mod amt discomfort.  Discussed plan of care via Secure Chat with the vascular team; Plans are being made to discharge soon with Home Health assistance and home Vac machine.   WOC team will change Vac on Monday if the patient is still in the hospital at that time.   Thank-you,  Stephane Fought MSN, RN, CWOCN, CWCN-AP, CNS Contact Mon-Fri 0700-1500: [phone]

## 2023-12-26 NOTE — Progress Notes (Signed)
 Progress Note    12/26/2023 11:05 AM 3 Days Post-Op  Subjective:  Manuel [name] is now POD #3 from:   PROCEDURE: Irrigation and debridement of right groin wound with VAC dressing change   POD #13 PROCEDURE: 1.   Right groin reexploration for bleeding 2.   Evacuation of right groin hematoma 3.   Suture repair of proximal bypass anastomosis in the distal external iliac artery 4.   Replacement of the sartorius flap for coverage 5.   Placement of gentamicin  and vancomycin  antibiotic impregnated beads 6.   Negative pressure dressing placement right groin   Patient is now POD #7    PROCEDURE: Irrigation and debridement of skin, soft tissue, and muscle of the right groin wound.  Placement of negative pressure dressing right groin.   Patient is resting comfortably in bed this morning in ICU eating breakfast. Patients wife is at his side this morning. No complaints overnight and vitals all remain stable.    Patient is resting comfortably in bed this morning.  Wound VAC is in place holding suction.  Drainage noted to be some serosanguineous only.  Vitals are remained stable.   Vitals:   12/25/23 2027 12/26/23 0816  BP: (!) 154/70 111/65  Pulse:  (!) 58  Resp:  18  Temp:  (!) 97.5 F (36.4 C)  SpO2:  99%   Physical Exam: Cardiac:  RRR.  Normal S1 and S2 no murmurs appreciated. Lungs: Lungs on auscultation are rhonchorous throughout.  Diminished in the bases.  No rales or wheezing noted.  Normal labored breathing on 2 L nasal cannula oxygen today Incisions: Right groin postoperative incision with wound VAC in place working well. Extremities: Bilateral lower extremities warm to touch with Doppler pulses. Abdomen: Positive bowel sounds throughout, soft, nontender nondistended. Neurologic: Alert and oriented x 3, answers all questions follows commands appropriately  CBC    Component Value Date/Time   WBC 6.9 12/22/2023 0519   RBC 3.13 (L) 12/22/2023 0519   HGB 9.3 (L)  12/22/2023 0519   HGB 15.7 02/16/2012 2013   HCT 28.6 (L) 12/22/2023 0519   HCT 44.4 02/16/2012 2013   PLT 217 12/22/2023 0519   PLT 176 02/16/2012 2013   MCV 91.4 12/22/2023 0519   MCV 91 02/16/2012 2013   MCH 29.7 12/22/2023 0519   MCHC 32.5 12/22/2023 0519   RDW 15.9 (H) 12/22/2023 0519   RDW 13.0 02/16/2012 2013   LYMPHSABS 0.5 (L) 12/04/2023 1141   MONOABS 0.5 12/04/2023 1141   EOSABS 0.1 12/04/2023 1141   BASOSABS 0.0 12/04/2023 1141    BMET    Component Value Date/Time   NA 134 (L) 12/22/2023 0519   NA 137 07/21/2023 1246   NA 137 02/16/2012 2013   K 4.6 12/22/2023 0519   K 3.8 02/16/2012 2013   CL 106 12/22/2023 0519   CL 102 02/16/2012 2013   CO2 24 12/22/2023 0519   CO2 25 02/16/2012 2013   GLUCOSE 135 (H) 12/22/2023 0519   GLUCOSE 154 (H) 02/16/2012 2013   BUN 23 12/22/2023 0519   BUN 30 (H) 07/21/2023 1246   BUN 10 02/16/2012 2013   CREATININE 0.77 12/22/2023 0519   CREATININE 0.72 02/16/2012 2013   CALCIUM  8.5 (L) 12/22/2023 0519   CALCIUM  9.6 02/16/2012 2013   GFRNONAA >60 12/22/2023 0519   GFRNONAA >60 02/16/2012 2013   GFRAA >60 12/09/2018 0929   GFRAA >60 02/16/2012 2013    INR    Component Value Date/Time   INR 1.3 (  H) 12/12/2023 9183     Intake/Output Summary (Last 24 hours) at 12/26/2023 1105 Last data filed at 12/26/2023 0700 Gross per 24 hour  Intake --  Output 685 ml  Net -685 ml     Assessment/Plan:  78 y.o. male is s/p SEE ABOVE 3 Days Post-Op   PLAN Patient was denied for: Inpatient acute rehab.  Patient will continue to need to go to rehab we will look for outside sources.  As soon as the patient has been accepted that is okay per vascular surgery for patient to be discharged to rehab. Patient will follow-up with vein and vascular surgery 2 weeks after patient has been discharged from the hospital.  Patient will need to bring to clinic on that visit wound VAC supplies so we may change the wound VAC and assess the wound.   Patient's wife was informed.   DVT prophylaxis:   ASA 81 mg daily, Plavix  75 mg Daily and Lovenox  40 mg SQ Q24    Brionne Mertz R [facility] 12/26/2023 11:05 AM

## 2023-12-26 NOTE — Progress Notes (Signed)
 SATURATION QUALIFICATIONS: (This note is used to comply with regulatory documentation for home oxygen)  Patient Saturations on Room Air at Rest = 94%  Patient Saturations on Room Air while Sleeping = 86%  Patient Saturations on 2 Liters of oxygen while Sleeping = 92%  Please briefly explain why patient needs home oxygen: Patient's oxygen sat drops as soon as he falls asleep.  Manuel [name]

## 2023-12-27 ENCOUNTER — Inpatient Hospital Stay

## 2023-12-27 DIAGNOSIS — I772 Rupture of artery: Secondary | ICD-10-CM | POA: Diagnosis not present

## 2023-12-27 LAB — CBC
HCT: 31.1 % — ABNORMAL LOW (ref 39.0–52.0)
Hemoglobin: 10.1 g/dL — ABNORMAL LOW (ref 13.0–17.0)
MCH: 29.1 pg (ref 26.0–34.0)
MCHC: 32.5 g/dL (ref 30.0–36.0)
MCV: 89.6 fL (ref 80.0–100.0)
Platelets: 226 K/uL (ref 150–400)
RBC: 3.47 MIL/uL — ABNORMAL LOW (ref 4.22–5.81)
RDW: 15.5 % (ref 11.5–15.5)
WBC: 8 K/uL (ref 4.0–10.5)
nRBC: 0 % (ref 0.0–0.2)

## 2023-12-27 LAB — GLUCOSE, CAPILLARY
Glucose-Capillary: 100 mg/dL — ABNORMAL HIGH (ref 70–99)
Glucose-Capillary: 101 mg/dL — ABNORMAL HIGH (ref 70–99)
Glucose-Capillary: 114 mg/dL — ABNORMAL HIGH (ref 70–99)
Glucose-Capillary: 147 mg/dL — ABNORMAL HIGH (ref 70–99)
Glucose-Capillary: 184 mg/dL — ABNORMAL HIGH (ref 70–99)

## 2023-12-27 LAB — BASIC METABOLIC PANEL WITH GFR
Anion gap: 7 (ref 5–15)
BUN: 13 mg/dL (ref 8–23)
CO2: 26 mmol/L (ref 22–32)
Calcium: 8.4 mg/dL — ABNORMAL LOW (ref 8.9–10.3)
Chloride: 101 mmol/L (ref 98–111)
Creatinine, Ser: 0.93 mg/dL (ref 0.61–1.24)
GFR, Estimated: >60 mL/min (ref >60)
Glucose, Bld: 135 mg/dL — ABNORMAL HIGH (ref 70–99)
Potassium: 4.4 mmol/L (ref 3.5–5.1)
Sodium: 134 mmol/L — ABNORMAL LOW (ref 135–145)

## 2023-12-27 MED ORDER — IOHEXOL 350 MG/ML SOLN
100.0000 mL | Freq: Once | INTRAVENOUS | Status: AC | PRN
  Administered 2023-12-27: 100 mL via INTRAVENOUS

## 2023-12-27 MED ORDER — MORPHINE SULFATE (PF) 2 MG/ML IV SOLN
INTRAVENOUS | Status: AC
  Administered 2023-12-27: 2 mg via INTRAVENOUS
  Filled 2023-12-27: qty 1

## 2023-12-27 MED ORDER — LORAZEPAM 2 MG/ML IJ SOLN
INTRAMUSCULAR | Status: AC
  Administered 2023-12-27: 2 mg via INTRAVENOUS
  Filled 2023-12-27: qty 1

## 2023-12-27 MED ORDER — MORPHINE SULFATE (PF) 2 MG/ML IV SOLN
2.0000 mg | INTRAVENOUS | Status: DC | PRN
  Administered 2023-12-27 – 2023-12-31 (×5): 2 mg via INTRAVENOUS
  Filled 2023-12-27 (×5): qty 1

## 2023-12-27 MED ORDER — LORAZEPAM 2 MG/ML IJ SOLN: 2.0000 mg | Freq: Once | INTRAMUSCULAR | Status: AC

## 2023-12-27 NOTE — Plan of Care (Signed)
  Problem: Health Behavior/Discharge Planning: Goal: Ability to manage health-related needs will improve Outcome: Progressing   Problem: Clinical Measurements: Goal: Ability to maintain clinical measurements within normal limits will improve Outcome: Progressing Goal: Will remain free from infection Outcome: Progressing Goal: Cardiovascular complication will be avoided Outcome: Progressing   Problem: Activity: Goal: Risk for activity intolerance will decrease Outcome: Progressing   Problem: Coping: Goal: Level of anxiety will decrease Outcome: Progressing   Problem: Elimination: Goal: Will not experience complications related to bowel motility Outcome: Progressing   Problem: Safety: Goal: Ability to remain free from injury will improve Outcome: Progressing

## 2023-12-27 NOTE — Plan of Care (Signed)
   Problem: Education: Goal: Knowledge of General Education information will improve Description Including pain rating scale, medication(s)/side effects and non-pharmacologic comfort measures Outcome: Progressing

## 2023-12-27 NOTE — Progress Notes (Signed)
 Wound vac canister changed.

## 2023-12-27 NOTE — Progress Notes (Signed)
 [facility]  Daily Progress Note   Subjective  -   He has increased output (now up to 3 cannisters/day) of serosanguineous material from the right groin VAC.  No fever or WBC.  I asked he be kept and ordered a CT scan which shows a moderately large series of fluid collections with gas in them.  No evidence of bleeding and patent vascular reconstruction.  There is a sartorius flap and revision of the groin anatomy as expected and no recent comparison CT scans.  After returning from the CT scanner he had screaming pain in the anterior thigh and required a significant amount of narcotic to control and now feels almost back to normal.  His sons are in the room with him.  Objective Vitals:   12/26/23 2043 12/27/23 0205 12/27/23 0349 12/27/23 0824  BP: (!) 158/85 134/68 130/72 (!) 147/72  Pulse: 77 61 60 67  Resp: 16 18 16 16   Temp: 98.3 F (36.8 C)  97.9 F (36.6 C) 98.1 F (36.7 C)  TempSrc:   Oral   SpO2: 100% 100% 100% 100%  Weight:      Height:        Intake/Output Summary (Last 24 hours) at 12/27/2023 1525 Last data filed at 12/27/2023 0700 Gross per 24 hour  Intake 316.67 ml  Output 600 ml  Net -283.33 ml    PULM  CTAB CV  RRR VASC  Warm right BKA.  Unremarkable VAC.  Modest thigh induration.  Very think serosanguineous drainage in [facility]  Laboratory CBC    Component Value Date/Time   WBC 8.0 12/27/2023 1354   HGB 10.1 (L) 12/27/2023 1354   HGB 15.7 02/16/2012 2013   HCT 31.1 (L) 12/27/2023 1354   HCT 44.4 02/16/2012 2013   PLT 226 12/27/2023 1354   PLT 176 02/16/2012 2013    BMET    Component Value Date/Time   NA 134 (L) 12/27/2023 1354   NA 137 07/21/2023 1246   NA 137 02/16/2012 2013   K 4.4 12/27/2023 1354   K 3.8 02/16/2012 2013   CL 101 12/27/2023 1354   CL 102 02/16/2012 2013   CO2 26 12/27/2023 1354   CO2 25 02/16/2012 2013   GLUCOSE 135 (H) 12/27/2023 1354   GLUCOSE 154 (H) 02/16/2012 2013   BUN 13 12/27/2023 1354   BUN 30 (H)  07/21/2023 1246   BUN 10 02/16/2012 2013   CREATININE 0.93 12/27/2023 1354   CREATININE 0.72 02/16/2012 2013   CALCIUM  8.4 (L) 12/27/2023 1354   CALCIUM  9.6 02/16/2012 2013   GFRNONAA >60 12/27/2023 1354   GFRNONAA >60 02/16/2012 2013   GFRAA >60 12/09/2018 0929   GFRAA >60 02/16/2012 2013    Assessment/Planning: POD #14 s/p revision of infected right CFA with endarterectomized SFA interposition graft, sartorius flap and Serratia marcesens growth on ABX.  I would like to change the VAC in the OR tomorrow to see if we can get the undrained areas drained and see if there is active infection and any bleeding with him in a controlled environment.  Sons and patient agree and will put on schedule for OR tomorrow.   Norleen LITTIE Pereyra  12/27/2023, 3:25 PM

## 2023-12-27 NOTE — Progress Notes (Signed)
 [facility]  - [place] [facility]   PATIENT NAME: Manuel [name]    MR#:  [id number]  DATE OF BIRTH:  [birth year]  SUBJECTIVE:  left message for patient's wife Starling on the phone. Patient's discharge did not happen yesterday since his wound VAC output was increased and wife was concerned about it. I did reach out to vascular surgery on call Dr. Elnor. Repeat CT scan was ordered. Patient complaining of pain in the right groin area more so after coming back from CT scan. Received IV morphine  and Ativan  and that helped patient calm down.    VITALS:  Blood pressure (!) 147/72, pulse 67, temperature 98.1 F (36.7 C), resp. rate 16, height 5' 8 (1.727 m), weight 92.9 kg, SpO2 100%.  PHYSICAL EXAMINATION:  GENERAL:  78 y.o.-year-old patient with no acute distress. Chronically ill LUNGS: Normal breath sounds bilaterally, no wheezing CARDIOVASCULAR: S1, S2 normal. No murmur   ABDOMEN: Soft, nontender, nondistended. Bowel sounds present.  EXTREMITIES: right groin wound vac, right BKA/prosthetic NEUROLOGIC: nonfocal  patient is alert and awake    LABORATORY PANEL:  CBC Recent Labs  Lab 12/22/23 0519  WBC 6.9  HGB 9.3*  HCT 28.6*  PLT 217    Chemistries  Recent Labs  Lab 12/22/23 0519  NA 134*  K 4.6  CL 106  CO2 24  GLUCOSE 135*  BUN 23  CREATININE 0.77  CALCIUM  8.5*   Cardiac Enzymes No results for input(s): TROPONINI in the last 168 hours. RADIOLOGY:  CT ANGIO PELVIS W OR WO CONTRAST Result Date: 12/27/2023 CLINICAL DATA:  Excess wound drainage post repair. Evaluate for new rupture or leak. Complex vascular history with major vascular reconstruction for infection and bleeding in the right groin. History of sartorius flap placement. EXAM: CT ANGIOGRAPHY PELVIS TECHNIQUE: Non-contrast CT of the pelvis was initially obtained. Multidetector CT imaging through the pelvis was performed using the standard protocol during bolus administration of intravenous  contrast. Multiplanar reconstructed images and MIPs were obtained and reviewed to evaluate the vascular anatomy. RADIATION DOSE REDUCTION: This exam was performed according to the departmental dose-optimization program which includes automated exposure control, adjustment of the mA and/or kV according to patient size and/or use of iterative reconstruction technique. CONTRAST:  OMNIPAQUE  IOHEXOL  350 MG/ML SOLN COMPARISON:  CT 12/04/2023 FINDINGS: VASCULAR Aorta: Extensive atherosclerotic disease in the distal abdominal aorta. Distal abdominal aorta at the bifurcation measures roughly 3.1 cm and stable. Inflow: Bilateral common iliac artery stents at the aortic bifurcation. Right common iliac artery stent is patent and appears to terminate in the distal common iliac artery. Severe disease at the origin of the right internal iliac artery. Right external iliac artery is patent. Surgical bypass graft between the right external iliac artery and right profunda femoral artery that is patent. No evidence for contrast extravasation in this area. Proximal right SFA has been resected. Visualized right profunda femoral arteries are patent with atherosclerotic disease. Left iliac stents involving the left common and left external iliac artery are patent. Stents terminate in the distal left external iliac artery. No significant flow in the proximal left internal iliac artery. Left common femoral artery is patent. Metallic structures anterior to left common femoral artery compatible with a closure device. Metallic structure just anterior to the proximal left SFA could also represent a vascular closure device. Mild-to-moderate stenosis in the proximal left SFA. Left profunda femoral arteries are heavily diseased but patent. Veins: Distal IVC is patent. Bilateral common and external iliac veins are  patent. Limited evaluation of the proximal femoral veins. Review of the MIP images confirms the above findings. NON-VASCULAR  Urinary Tract: No acute abnormality in the visualized kidneys. Normal appearance of the urinary bladder. Bowel: Extensive diverticulosis in the sigmoid colon without acute bowel inflammation. No significant bowel dilatation in the pelvis. Lymphatic: No significant lymph node enlargement in the pelvis Reproductive: Prostate is unremarkable. Other: No free fluid in the pelvis. Musculoskeletal: Diffuse subcutaneous edema throughout the pelvis. Open wound in the anterior right pelvis. Bilobed fluid collection in the anterior pelvis containing a small amount of gas. The lateral aspect of the collection is low-density and the more medial collection is slightly hyperdense. This bilobed collection measures 10.1 cm in transverse dimension on image 44/16. The medial aspect of the collection extends along the anteromedial aspect of the left thigh and roughly measures 13 cm in the craniocaudal dimension. Small amount of fluid extends into the right retroperitoneal space on image 34/16. Vascular bypass from the right external to the right profunda femoral arteries is surrounded by the more medial fluid collection. Extensive degenerative facet disease in lower lumbar spine. Disc space narrowing at L5-S1. Review of the MIP images confirms the above findings. IMPRESSION: 1. Extensive postsurgical changes. The surgical bypass from the right external iliac artery to right profunda femoral arteries is patent. No evidence for contrast extravasation or active bleeding. However, the surgical bypass is surrounded by the fluid collection in the medial right thigh that contains a small amount of gas. The fluid collections are nonspecific. Abscess or infected seroma/hematoma cannot be excluded. 2. Extensive atherosclerotic disease involving the aorta and iliac arteries. Bilateral iliac artery stents are patent. 3. 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 4. Extensive subcutaneous edema in the pelvis and proximal thighs.  Electronically Signed   By: Juliene Balder M.D.   On: 12/27/2023 10:56    Assessment and Plan  78 y.o. 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 Patient fell with walker landing on top of him.    8/27.  Patient to go to the operating room for first wound VAC change. 8/28.  Patient can transfer out of the ICU.  Vascular team plans on taking back to the OR on Tuesday for wound VAC change. 8/29.  Patient and wife interested in going home after hospitalization.  Will need IV antibiotics upon discharge.  Nonsustained ventricular tachycardia 8 beats.  Patient given IV magnesium . 8/30.  Patient qualifies for nocturnal oxygen. 8/31.  Patient wants to go home rather than acute inpatient rehab. 9/1.  Will restart MiraLAX .  Hemoglobin 9.3.  PICC line placed. 9/2.  Patient told me this morning that he wanted to go to acute inpatient rehab.  Dr. [name] changed the wound VAC in the operating room and he is okay with nursing staff changing moving forward. 9/3--I assumed care of patient. C/o itching over his body. Did not participate with PT today. Per vascular ok to d/c to rehab. CIR was denied by insurance 9/4-- sitting up with the recliner chair. Had a meaningful conversation. Discussed about discharge planning to rehab patient agreeable. Discussed with wife. Patient states he is been  feeling a bit sleepy. I tried to minimize and discontinue sedating meds. Patient has history of sleep apnea he is in the process of getting CPAP at home 9/5-- patient and wife declined to rehab. They are going home. Pam from infusion therapy will do education with wife. Home health arranged. Wound VAC changed by wound  nurse. 9/6-- given increase wound VAC output I discussed with Dr. Elnor vascular on call. Repeated CT pelvis that shows collection of fluid around the surgical site. Dr. Elnor to evaluate. Patient complained of excruciating pain in the right thigh received two of morphine  and two of Ativan  which helped ease down. Discharge cancel     Assessment and Plan: Rupture of femoral artery (HCC) 8/14.  Patient had percutaneous transluminal angioplasty and stent placement of the right profunda femoris and common femoral artery extending into the distal right external iliac artery. 8/21.  Right external iliac artery to profunda femoris artery bypass with autologous ipsilateral superficial femoral artery, resection of infected right, femoral artery and removal of common femoral artery, profunda femoris artery and superficial femoral artery stents.  Harvest of right superficial femoral artery for bypass with endarterectomy of the right superficial femoral artery. 8/27.  Wound VAC changed in the operating room 9/2.  Wound VAC changed in the operating room.  Dr. [name] gave clearance to follow-up as outpatient and have nursing staff change wound VAC moving forward. 9/5 wound vac changed today by WOC RN.  9/6-- CT pelvis shows significant fluid collection around the surgical site, await vascular to evaluate. Check cbc, BMP     Severe sepsis (HCC) --Secondary to Serratia.  Present on admission.   --Currently on Maxipime  as per ID.  -- Will need total of 4 to 6 weeks of IV antibiotics with ertapenem .  End date for IV abx 01/08/24 --PICC line placed on 9/1.    -- follow-up with Dr. Searcy.   Hemorrhagic shock (HCC) Resolved.   Chronic systolic CHF (congestive heart failure) (HCC) Last EF 20%, continue low-dose Coreg , enteresto,farxiga  and Aldactone  Pt to follow with [facility] cardiology Dr [name]   Uncontrolled type 2 diabetes mellitus with hyperglycemia, with long-term current use of insulin  (HCC) Cont present  Insulin  dosing short and long acting   NSTEMI (non-ST elevated myocardial infarction) (HCC) Secondary to hemorrhagic shock.  Patient on aspirin , Plavix , Coreg  and statin.  Resumed enteresto and farxiga    PAD (peripheral artery disease) (HCC) Continue aspirin  and Plavix , history of right BKA.   AKI (acute kidney injury) (HCC) With metabolic acidosis.  Resolved.     Hyperkalemia Resolved   Sleep apnea Will need outpatient sleep study.  With nocturnal hypoxia with overnight oximetry he qualifies for  oxygen.   History of alcohol  abuse On thiamine    Iron  deficiency anemia Last hemoglobin 9.3.  Received 3 units of blood during the hospital course. --resumed po iron        Procedures: as per vascular sx Family communication :wife [name] at bedside Consults : vascular, infectious disease CODE STATUS: DNR DVT Prophylaxis : Lovenox   Level of care: Med-Surg Status is: Inpatient ongoing pain right groin with new fluid collection around the surgical site with small amount of gas    TOTAL TIME TAKING CARE OF THIS PATIENT: 45 minutes.  >50% time spent on counselling and coordination of care  Note: This dictation was prepared with Dragon dictation along with smaller phrase technology. Any transcriptional errors that result from this process are unintentional.  [name] M.D    [facility]  CC: Primary care physician; [name], [name], MD

## 2023-12-28 ENCOUNTER — Inpatient Hospital Stay: Admitting: Anesthesiology

## 2023-12-28 ENCOUNTER — Encounter
Admission: EM | Disposition: A | Discharge status: Skilled Nursing Facility | Payer: Self-pay | Source: Home / Self Care | Attending: Internal Medicine

## 2023-12-28 ENCOUNTER — Other Ambulatory Visit: Payer: Self-pay

## 2023-12-28 DIAGNOSIS — Z9889 Other specified postprocedural states: Secondary | ICD-10-CM

## 2023-12-28 DIAGNOSIS — T827XXA Infection and inflammatory reaction due to other cardiac and vascular devices, implants and grafts, initial encounter: Secondary | ICD-10-CM | POA: Diagnosis not present

## 2023-12-28 DIAGNOSIS — A498 Other bacterial infections of unspecified site: Secondary | ICD-10-CM | POA: Diagnosis not present

## 2023-12-28 DIAGNOSIS — T8149XA Infection following a procedure, other surgical site, initial encounter: Secondary | ICD-10-CM

## 2023-12-28 DIAGNOSIS — R7881 Bacteremia: Secondary | ICD-10-CM | POA: Diagnosis not present

## 2023-12-28 HISTORY — PX: GROIN DEBRIDEMENT: SHX5159

## 2023-12-28 LAB — GLUCOSE, CAPILLARY
Glucose-Capillary: 133 mg/dL — ABNORMAL HIGH (ref 70–99)
Glucose-Capillary: 131 mg/dL — ABNORMAL HIGH (ref 70–99)
Glucose-Capillary: 107 mg/dL — ABNORMAL HIGH (ref 70–99)
Glucose-Capillary: 104 mg/dL — ABNORMAL HIGH (ref 70–99)
Glucose-Capillary: 97 mg/dL (ref 70–99)

## 2023-12-28 SURGERY — DEBRIDEMENT, INGUINAL REGION
Anesthesia: General | Laterality: Right

## 2023-12-28 MED ORDER — OXYCODONE HCL 5 MG/5ML PO SOLN: 5.0000 mg | Freq: Once | ORAL | Status: DC | PRN

## 2023-12-28 MED ORDER — PROPOFOL 500 MG/50ML IV EMUL
INTRAVENOUS | Status: DC | PRN
  Administered 2023-12-28: 100 ug/kg/min via INTRAVENOUS

## 2023-12-28 MED ORDER — 0.9 % SODIUM CHLORIDE (POUR BTL) OPTIME
TOPICAL | Status: DC | PRN
  Administered 2023-12-28: 500 mL

## 2023-12-28 MED ORDER — FENTANYL CITRATE (PF) 100 MCG/2ML IJ SOLN: 25.0000 ug | INTRAMUSCULAR | Status: DC | PRN

## 2023-12-28 MED ORDER — VASHE WOUND IRRIGATION OPTIME
TOPICAL | Status: DC | PRN
  Administered 2023-12-28: 34 [oz_av]

## 2023-12-28 MED ORDER — PHENYLEPHRINE 80 MCG/ML (10ML) SYRINGE FOR IV PUSH (FOR BLOOD PRESSURE SUPPORT)
PREFILLED_SYRINGE | INTRAVENOUS | Status: DC | PRN
  Administered 2023-12-28: 160 ug via INTRAVENOUS

## 2023-12-28 MED ORDER — GLYCOPYRROLATE 0.2 MG/ML IJ SOLN
INTRAMUSCULAR | Status: DC | PRN
Start: 2023-12-28 — End: 2023-12-28
  Administered 2023-12-28: .2 mg via INTRAVENOUS

## 2023-12-28 MED ORDER — IPRATROPIUM-ALBUTEROL 0.5-2.5 (3) MG/3ML IN SOLN
RESPIRATORY_TRACT | Status: AC
  Filled 2023-12-28: qty 3

## 2023-12-28 MED ORDER — OXYCODONE HCL 5 MG PO TABS: 5.0000 mg | ORAL_TABLET | Freq: Once | ORAL | Status: DC | PRN

## 2023-12-28 MED ORDER — IPRATROPIUM-ALBUTEROL 0.5-2.5 (3) MG/3ML IN SOLN: 3.0000 mL | RESPIRATORY_TRACT | Status: DC

## 2023-12-28 MED ORDER — EPHEDRINE SULFATE-NACL 50-0.9 MG/10ML-% IV SOSY
PREFILLED_SYRINGE | INTRAVENOUS | Status: DC | PRN
  Administered 2023-12-28: 10 mg via INTRAVENOUS

## 2023-12-28 MED ORDER — PROPOFOL 10 MG/ML IV BOLUS
INTRAVENOUS | Status: DC | PRN
  Administered 2023-12-28: 20 mg via INTRAVENOUS

## 2023-12-28 MED ORDER — HYDROXYZINE HCL 25 MG PO TABS
25.0000 mg | ORAL_TABLET | Freq: Three times a day (TID) | ORAL | Status: DC | PRN
  Administered 2023-12-31 (×2): 25 mg via ORAL
  Filled 2023-12-28 (×2): qty 1

## 2023-12-28 MED ORDER — FENTANYL CITRATE (PF) 100 MCG/2ML IJ SOLN
INTRAMUSCULAR | Status: DC | PRN
  Administered 2023-12-28 (×2): 25 ug via INTRAVENOUS
  Administered 2023-12-28: 50 ug via INTRAVENOUS

## 2023-12-28 MED ORDER — IPRATROPIUM-ALBUTEROL 0.5-2.5 (3) MG/3ML IN SOLN
3.0000 mL | Freq: Once | RESPIRATORY_TRACT | Status: AC
  Administered 2023-12-28: 3 mL via RESPIRATORY_TRACT

## 2023-12-28 MED ORDER — SODIUM CHLORIDE 0.9 % IV SOLN: INTRAVENOUS | Status: DC | PRN

## 2023-12-28 MED ORDER — FENTANYL CITRATE (PF) 100 MCG/2ML IJ SOLN
INTRAMUSCULAR | Status: AC
Start: 2023-12-28 — End: 2023-12-28
  Filled 2023-12-28: qty 2

## 2023-12-28 SURGICAL SUPPLY — 24 items
ADHESIVE MASTISOL STRL (MISCELLANEOUS) IMPLANT
APPLICATOR COTTON TIP 6 STRL (MISCELLANEOUS) IMPLANT
BNDG COHESIVE 6X5 TAN ST LF (GAUZE/BANDAGES/DRESSINGS) ×1 IMPLANT
CANISTER WOUND CARE 500ML ATS (WOUND CARE) IMPLANT
CLEANSER WND VASHE 34 (WOUND CARE) IMPLANT
CNTNR URN SCR LID CUP LEK RST (MISCELLANEOUS) IMPLANT
DRAPE FOR WOUND VAC UNIT (DRAPES) IMPLANT
DRSG VAC GRANUFOAM MED (GAUZE/BANDAGES/DRESSINGS) IMPLANT
ELECTRODE REM PT RTRN 9FT ADLT (ELECTROSURGICAL) ×1 IMPLANT
GAUZE SPONGE 4X4 12PLY STRL (GAUZE/BANDAGES/DRESSINGS) IMPLANT
GLOVE BIO SURGEON STRL SZ7 (GLOVE) ×1 IMPLANT
GLOVE SURG SYN 8.0 PF PI (GLOVE) ×1 IMPLANT
GOWN STRL REUS W/ TWL LRG LVL3 (GOWN DISPOSABLE) ×1 IMPLANT
GOWN STRL REUS W/ TWL XL LVL3 (GOWN DISPOSABLE) ×1 IMPLANT
KIT TURNOVER KIT A (KITS) ×1 IMPLANT
MANIFOLD NEPTUNE II (INSTRUMENTS) ×1 IMPLANT
NS IRRIG 500ML POUR BTL (IV SOLUTION) ×1 IMPLANT
PACK EXTREMITY ARMC (MISCELLANEOUS) ×1 IMPLANT
PAD PREP OB/GYN DISP 24X41 (PERSONAL CARE ITEMS) ×1 IMPLANT
PENCIL SMOKE EVACUATOR (MISCELLANEOUS) ×1 IMPLANT
SOLUTION PREP PVP 2OZ (MISCELLANEOUS) IMPLANT
SPONGE T-LAP 18X18 ~~LOC~~+RFID (SPONGE) IMPLANT
SUT MON AB 2-0 CT1 36 (SUTURE) IMPLANT
TRAP FLUID SMOKE EVACUATOR (MISCELLANEOUS) ×1 IMPLANT

## 2023-12-28 NOTE — Anesthesia Postprocedure Evaluation (Signed)
 Anesthesia Post Note  Patient: Manuel [name]  Procedure(s) Performed: DEBRIDEMENT, INGUINAL REGION (Right)  Patient location during evaluation: PACU Anesthesia Type: General Level of consciousness: awake and alert Pain management: pain level controlled Vital Signs Assessment: post-procedure vital signs reviewed and stable Respiratory status: spontaneous breathing, nonlabored ventilation and respiratory function stable Cardiovascular status: blood pressure returned to baseline and stable Postop Assessment: no apparent nausea or vomiting Anesthetic complications: no   No notable events documented.   Last Vitals:  Vitals:   12/28/23 1015 12/28/23 1030  BP: (!) 154/86 (!) 156/78  Pulse: 87 83  Resp: 13 (!) 35  Temp: (!) 36.3 C   SpO2: 100% 100%    Last Pain:  Vitals:   12/28/23 1030  TempSrc:   PainSc: 0-No pain                 Fairy POUR Barnell Shieh

## 2023-12-28 NOTE — Progress Notes (Signed)
 [facility]  - [place] at [facility]   PATIENT NAME: Manuel [name]    MR#:  [id number]  DATE OF BIRTH:  Apr 20, 1946  SUBJECTIVE:  Pt's wife at bedside today patient is status post change of wound VAC and drainage of C Roma cavity that was found around the surgical site by vascular surgery Dr. Elnor. Patient denies any pain at present. Tolerating PO diet.   VITALS:  Blood pressure (!) 141/84, pulse 80, temperature 97.8 F (36.6 C), temperature source Oral, resp. rate (!) 35, height 5' 8 (1.727 m), weight 92.9 kg, SpO2 94%.  PHYSICAL EXAMINATION:  GENERAL:  78 y.o.-year-old patient with no acute distress. Chronically ill LUNGS: Normal breath sounds bilaterally, no wheezing CARDIOVASCULAR: S1, S2 normal. No murmur   ABDOMEN: Soft, nontender, nondistended. Bowel sounds present.  EXTREMITIES: right groin wound vac, right BKA/prosthetic NEUROLOGIC: nonfocal  patient is alert and awake    LABORATORY PANEL:  CBC Recent Labs  Lab 12/27/23 1354  WBC 8.0  HGB 10.1*  HCT 31.1*  PLT 226    Chemistries  Recent Labs  Lab 12/27/23 1354  NA 134*  K 4.4  CL 101  CO2 26  GLUCOSE 135*  BUN 13  CREATININE 0.93  CALCIUM  8.4*   Cardiac Enzymes No results for input(s): TROPONINI in the last 168 hours. RADIOLOGY:  CT ANGIO PELVIS W OR WO CONTRAST Result Date: 12/27/2023 CLINICAL DATA:  Excess wound drainage post repair. Evaluate for new rupture or leak. Complex vascular history with major vascular reconstruction for infection and bleeding in the right groin. History of sartorius flap placement. EXAM: CT ANGIOGRAPHY PELVIS TECHNIQUE: Non-contrast CT of the pelvis was initially obtained. Multidetector CT imaging through the pelvis was performed using the standard protocol during bolus administration of intravenous contrast. Multiplanar reconstructed images and MIPs were obtained and reviewed to evaluate the vascular anatomy. RADIATION DOSE REDUCTION: This exam was  performed according to the departmental dose-optimization program which includes automated exposure control, adjustment of the mA and/or kV according to patient size and/or use of iterative reconstruction technique. CONTRAST:  OMNIPAQUE  IOHEXOL  350 MG/ML SOLN COMPARISON:  CT 12/04/2023 FINDINGS: VASCULAR Aorta: Extensive atherosclerotic disease in the distal abdominal aorta. Distal abdominal aorta at the bifurcation measures roughly 3.1 cm and stable. Inflow: Bilateral common iliac artery stents at the aortic bifurcation. Right common iliac artery stent is patent and appears to terminate in the distal common iliac artery. Severe disease at the origin of the right internal iliac artery. Right external iliac artery is patent. Surgical bypass graft between the right external iliac artery and right profunda femoral artery that is patent. No evidence for contrast extravasation in this area. Proximal right SFA has been resected. Visualized right profunda femoral arteries are patent with atherosclerotic disease. Left iliac stents involving the left common and left external iliac artery are patent. Stents terminate in the distal left external iliac artery. No significant flow in the proximal left internal iliac artery. Left common femoral artery is patent. Metallic structures anterior to left common femoral artery compatible with a closure device. Metallic structure just anterior to the proximal left SFA could also represent a vascular closure device. Mild-to-moderate stenosis in the proximal left SFA. Left profunda femoral arteries are heavily diseased but patent. Veins: Distal IVC is patent. Bilateral common and external iliac veins are patent. Limited evaluation of the proximal femoral veins. Review of the MIP images confirms the above findings. NON-VASCULAR Urinary Tract: No acute abnormality in the visualized kidneys. Normal appearance  of the urinary bladder. Bowel: Extensive diverticulosis in the sigmoid colon  without acute bowel inflammation. No significant bowel dilatation in the pelvis. Lymphatic: No significant lymph node enlargement in the pelvis Reproductive: Prostate is unremarkable. Other: No free fluid in the pelvis. Musculoskeletal: Diffuse subcutaneous edema throughout the pelvis. Open wound in the anterior right pelvis. Bilobed fluid collection in the anterior pelvis containing a small amount of gas. The lateral aspect of the collection is low-density and the more medial collection is slightly hyperdense. This bilobed collection measures 10.1 cm in transverse dimension on image 44/16. The medial aspect of the collection extends along the anteromedial aspect of the left thigh and roughly measures 13 cm in the craniocaudal dimension. Small amount of fluid extends into the right retroperitoneal space on image 34/16. Vascular bypass from the right external to the right profunda femoral arteries is surrounded by the more medial fluid collection. Extensive degenerative facet disease in lower lumbar spine. Disc space narrowing at L5-S1. Review of the MIP images confirms the above findings. IMPRESSION: 1. Extensive postsurgical changes. The surgical bypass from the right external iliac artery to right profunda femoral arteries is patent. No evidence for contrast extravasation or active bleeding. However, the surgical bypass is surrounded by the fluid collection in the medial right thigh that contains a small amount of gas. The fluid collections are nonspecific. Abscess or infected seroma/hematoma cannot be excluded. 2. Extensive atherosclerotic disease involving the aorta and iliac arteries. Bilateral iliac artery stents are patent. 3. 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 4. Extensive subcutaneous edema in the pelvis and proximal thighs. Electronically Signed   By: Juliene Balder M.D.   On: 12/27/2023 10:56    Assessment and Plan  78 y.o. 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 Patient fell with walker landing on top of him.    8/27.  Patient to go to the operating room for first wound VAC change. 8/28.  Patient can transfer out of the ICU.  Vascular team plans on taking back to the OR on Tuesday for wound VAC change. 8/29.  Patient and wife interested in going home after hospitalization.  Will need IV antibiotics upon discharge.  Nonsustained ventricular tachycardia 8 beats.  Patient given IV magnesium . 8/30.  Patient qualifies for nocturnal oxygen. 8/31.  Patient wants to go home rather than acute inpatient rehab. 9/1.  Will restart MiraLAX .  Hemoglobin 9.3.  PICC line placed. 9/2.  Patient told me this morning that he wanted to go to acute inpatient rehab.  Dr. [name] changed the wound VAC in the operating room and he is okay with nursing staff changing moving forward. 9/3--I assumed care of patient. C/o itching over his body. Did not participate with PT today. Per vascular ok to d/c to rehab. CIR was denied by insurance 9/4-- sitting up with the recliner chair. Had a meaningful conversation. Discussed about discharge planning to rehab patient agreeable. Discussed with wife. Patient states he is been feeling a bit sleepy. I tried to minimize and discontinue sedating meds. Patient has history of sleep apnea he is in the process of getting CPAP at home 9/5--  patient and wife declined to rehab. They are going home. Pam from infusion therapy will do education with wife. Home health arranged. Wound VAC changed by wound nurse. 9/6-- given increase wound VAC output I discussed with Dr. Elnor vascular on call. Repeated CT pelvis that shows collection of fluid around the  surgical site. Dr. Elnor to evaluate. Patient complained of excruciating pain in the right thigh received two of morphine  and two of Ativan  which helped ease down. Discharge cancel 9/7-- underwent groin debridement of seroma and placement of Wound vac by Dr elnor. Recommends change of Vac on tuesday     Assessment and Plan: Rupture of femoral artery (HCC) 8/14.  Patient had percutaneous transluminal angioplasty and stent placement of the right profunda femoris and common femoral artery extending into the distal right external iliac artery. 8/21.  Right external iliac artery to profunda femoris artery bypass with autologous ipsilateral superficial femoral artery, resection of infected right, femoral artery and removal of common femoral artery, profunda femoris artery and superficial femoral artery stents.  Harvest of right superficial femoral artery for bypass with endarterectomy of the right superficial femoral artery. 8/27.  Wound VAC changed in the operating room 9/2.  Wound VAC changed in the operating room.  Dr. [name] gave clearance to follow-up as outpatient and have nursing staff change wound VAC moving forward. 9/5 wound vac changed today by WOC RN.  9/6-- CT pelvis shows significant fluid collection around the surgical site, await vascular to evaluate.      Severe sepsis (HCC) --Secondary to Serratia.  Present on admission.   --Currently on Maxipime  as per ID.  -- Will need total of 4 to 6 weeks of IV antibiotics with ertapenem .  End date for IV abx 01/08/24 --PICC line placed on 9/1.    -- follow-up with Dr. Searcy.   Hemorrhagic shock (HCC) Resolved.   Chronic systolic CHF (congestive heart failure) (HCC) Last EF 20%, continue low-dose Coreg , enteresto,farxiga  and Aldactone  Pt to follow with [facility] cardiology Dr [name]   Uncontrolled type 2 diabetes mellitus with hyperglycemia, with long-term current use of insulin  (HCC) Cont present Insulin  dosing short and long acting    NSTEMI (non-ST elevated myocardial infarction) (HCC) Secondary to hemorrhagic shock.  Patient on aspirin , Plavix , Coreg  and statin.  Resumed enteresto and farxiga    PAD (peripheral artery disease) (HCC) Continue aspirin  and Plavix , history of right BKA.   AKI (acute kidney injury) (HCC) With metabolic acidosis.  Resolved.     Hyperkalemia Resolved   Sleep apnea Will need outpatient sleep study.  With nocturnal hypoxia with overnight oximetry he qualifies for  oxygen.   History of alcohol  abuse On thiamine    Iron  deficiency anemia Last hemoglobin 9.3.  Received 3 units of blood during the hospital course. --resumed po iron        Procedures: as per vascular sx Family communication :wife [name] at bedside Consults : vascular, infectious disease CODE STATUS: DNR DVT Prophylaxis : Lovenox   Level of care: Med-Surg Status is: Inpatient ongoing pain right groin with new fluid collection around the surgical site with small amount of gas    TOTAL TIME TAKING CARE OF THIS PATIENT: 45 minutes.  >50% time spent on counselling and coordination of care  Note: This dictation was prepared with Dragon dictation along with smaller phrase technology. Any transcriptional errors that result from this process are unintentional.  [name] M.D    [facility]   CC: Primary care physician; [name], [name], MD

## 2023-12-28 NOTE — Anesthesia Preprocedure Evaluation (Addendum)
 Anesthesia Evaluation  Patient identified by MRN, date of birth, ID band Patient awake    Reviewed: Allergy & Precautions, NPO status , Patient's Chart, lab work & pertinent test results  History of Anesthesia Complications Negative for: history of anesthetic complications  Airway Mallampati: III  TM Distance: <3 FB Neck ROM: full    Dental  (+) Missing   Pulmonary neg shortness of breath, sleep apnea , pneumonia, unresolved   + rhonchi  + decreased breath sounds      Cardiovascular hypertension, + CAD, + Past MI and +CHF  Normal cardiovascular exam     Neuro/Psych  Neuromuscular disease  negative psych ROS   GI/Hepatic negative GI ROS, Neg liver ROS,neg GERD  ,,  Endo/Other  diabetes    Renal/GU Renal disease  negative genitourinary   Musculoskeletal   Abdominal   Peds  Hematology negative hematology ROS (+)   Anesthesia Other Findings Past Medical History: 04/19/2010: Acute ST elevation myocardial infarction (STEMI) of  inferior wall (HCC)     Comment:  a.) transfered from [facility] to [facility] --> LHC/PCI (very               difficult procedure) --> 3.0 x 23 mm and 3.0 x 12 mm               Xience stents to RCA No date: Allergies No date: Arthritis No date: Benign essential hypertension 05/08/2021: Bilateral carotid artery disease (HCC)     Comment:  a.) carotid doppler 05/08/2021: 1-39% BICA 04/19/2010: CAD (coronary artery disease)     Comment:  a.) inferior STEMI 04/19/2010 --> LHC/PCI: 50-70% pD1,               80% pRI, 90/90/90% RCA (overlapping 3.0 x 23 and 3.0 x 12              mm Xience DES); b.) MV 11/10/2018: fixed minimally               reversible inferior perfusion defect No date: Cellulitis of foot No date: Chronic HFrEF (heart failure with reduced ejection fraction)  (HCC) No date: DDD (degenerative disc disease), cervical No date: Diabetes mellitus type 2, insulin  dependent (HCC) No date:  Diverticulosis No date: Full dentures No date: Gout No date: Hard of hearing 2022: History of bilateral cataract extraction No date: History of ETOH abuse No date: Hyperlipidemia 04/19/2010: Ischemic cardiomyopathy     Comment:  a.) TTE 04/19/2010: 40%; b.) TTE 04/20/2014: EF >55%;               c.) TTE 11/10/2018: EF 45%; d.) TTE 11/14/2022: EF               25-30%; e. 06/2023 Echo: EF 25-30%, nl RV size/fxn. Mild               MR. Mild-mod TR. Ao sclerosis w/o stenosis. No date: Long term current use of aspirin  No date: Long term current use of clopidogrel  No date: Lumbar degenerative disc disease No date: Lumbar radiculopathy No date: Lumbar vertebral fracture (chronic superior endplate of L1) 01/15/2023: NSTEMI (non-ST elevated myocardial infarction) (HCC) No date: OSA (obstructive sleep apnea)     Comment:  a.) unable to tolerate nocturnal PAP therapy No date: Peripheral artery disease (HCC)     Comment:  a.) stenting 05/14/21: 12 mm x 12 cm LifeStent RIGHT dis               SFA/prox pop; b.) s/p cath directed  thrombolysis RIGHT               SFA/pop 06/14/21; c.) s/p mech thrombectomy + stenting               06/15/21: 8 mm x 25cm & 8 mm x 7.5cm Viabahn; d.) s/p               BILAT CFA, profunda femoris, SFA endarterectomies +               fogarty embolectomy + stenting 11/15/22: 12mm x 58mm               Lifestream BILAT CIAs, 14 mm x 6 cm Lifestream & 13 mm x               5 cm Viabahn LEFT EIA No date: Peripheral neuropathy No date: Umbilical hernia  Past Surgical History: 11/18/2022: AMPUTATION; Right     Comment:  Procedure: AMPUTATION 4TH AND 5TH RAY;  Surgeon: [name], [name];  Location: [facility];  Service:               Orthopedics/Podiatry;  Laterality: Right;  4th and 5th               toe 04/20/2023: AMPUTATION; Right     Comment:  Procedure: AMPUTATION BELOW KNEE;  Surgeon: [name], MD;  Location: [facility];  Service: Vascular;                 Laterality: Right;  block then general 01/09/2023: APPLICATION OF WOUND VAC; Right     Comment:  Procedure: APPLICATION OF WOUND VAC;  Surgeon: [name], MD;  Location: [facility];  Service: Vascular;                Laterality: Right; 12/11/2023: APPLICATION OF WOUND VAC; Right     Comment:  Procedure: APPLICATION, WOUND VAC;  Surgeon: [name], MD;  Location: [facility];  Service: Vascular;                Laterality: Right; 12/12/2023: APPLICATION OF WOUND VAC; Right     Comment:  Procedure: APPLICATION, WOUND VAC;  Surgeon: [name], MD;  Location: [facility];  Service: Vascular;                Laterality: Right; 12/17/2023: APPLICATION OF WOUND VAC; Right     Comment:  Procedure: APPLICATION, WOUND VAC;  Surgeon: [name], MD;  Location: [facility];  Service: Vascular;                Laterality: Right;  EXCHANGE 12/23/2023: APPLICATION OF WOUND VAC; Right     Comment:  Procedure: APPLICATION, WOUND VAC;  Surgeon: [name], MD;  Location: [facility];  Service: General;                Laterality: Right; 03/14/2021: CATARACT EXTRACTION W/PHACO; Right  Comment:  Procedure: CATARACT EXTRACTION PHACO AND INTRAOCULAR               LENS PLACEMENT (IOC) RIGHT DIABETIC;  Surgeon:               [name], MD;  Location: [facility];              Service: Ophthalmology;  Laterality: Right;                Diabetic 16.78 01:39.9 03/28/2021: CATARACT EXTRACTION W/PHACO; Left     Comment:  Procedure: CATARACT EXTRACTION PHACO AND INTRAOCULAR               LENS PLACEMENT (IOC) LEFT DIABETIC 6.93 01:22.0;                Surgeon: [name], MD;  Location: [facility];  Service: Ophthalmology;  Laterality: Left;              Diabetic No date: COLONOSCOPY 03/2010: CORONARY ANGIOPLASTY WITH STENT PLACEMENT     Comment:  Procedure: CORONARY  ANGIOPLASTY WITH STENT PLACEMENT;               Location: [name] 07/14/2023: CORONARY STENT INTERVENTION; N/A     Comment:  Procedure: CORONARY STENT INTERVENTION;  Surgeon: [name], MD;  Location: [facility];                Service: Cardiovascular;  Laterality: N/A; 11/15/2022: ENDARTERECTOMY FEMORAL; Bilateral     Comment:  Procedure: BILATERAL COMMON FEMORAL PROFUNDA FEMORIS AND              SUPERFICIAL FEMORAL ARTERY ENDARTECTOMIES, RIGHT FOGARTY               EMBOLECTOMY OF THE RIGHT SFA  AND  POPLITEAL ARTERIES.               EZELLA AND RIGHT LOWER EXTREMITY ANGIOGRAM.;  Surgeon:              [name], MD;  Location: [facility];  Service:               Vascular;  Laterality: Bilateral; 12/11/2023: ENDARTERECTOMY FEMORAL; Right     Comment:  Procedure: ENDARTERECTOMY, FEMORAL;  Surgeon: [name], MD;  Location: [facility];  Service: Vascular;                Laterality: Right;  Re-do of femoral endarterectomy with               sartorius flap 12/12/2023: FEMORAL ARTERY EXPLORATION; Right     Comment:  Procedure: EXPLORATION, ARTERY, FEMORAL- stop bleeding               with suture repair anastomaosis , abx beads;  Surgeon:               [name], MD;  Location: [facility];  Service:               Vascular;  Laterality: Right; 12/12/2023: HEMATOMA EVACUATION; N/A     Comment:  Procedure: EVACUATION HEMATOMA;  Surgeon: [name],               MD;  Location:  [facility];  Service: Vascular;  Laterality:              N/A; 11/15/2022: INSERTION OF ILIAC STENT; Bilateral     Comment:  Procedure: BILATERAL STENT INSERTION IN BILATERAL                COMMON ILIAC ARTERY, STENT INSERTION OF LEFT EXTERNAL               ILIAC ARTERY. ANGIOPLASTY RIGHT TIBIAL  AND POPLITEAL               ARTERY.;  Surgeon: [name], MD;  Location: [facility];              Service: Vascular;  Laterality: Bilateral; 04/21/2023: IRRIGATION AND DEBRIDEMENT  KNEE; Right     Comment:  Procedure: IRRIGATION AND DEBRIDEMENT KNEE;  Surgeon:               Robbin Redell Ned, MD;  Location: [facility];  Service:              Orthopedics;  Laterality: Right; 05/14/2021: LOWER EXTREMITY ANGIOGRAPHY; Right     Comment:  Procedure: LOWER EXTREMITY ANGIOGRAPHY;  Surgeon: [name], MD;  Location: [facility];  Service:               Cardiovascular;  Laterality: Right; 06/14/2021: LOWER EXTREMITY ANGIOGRAPHY; Right     Comment:  Procedure: Lower Extremity Angiography;  Surgeon: [name], MD;  Location: [facility];  Service:               Cardiovascular;  Laterality: Right; 11/11/2022: LOWER EXTREMITY ANGIOGRAPHY; Right     Comment:  Procedure: Lower Extremity Angiography;  Surgeon: [name], MD;  Location: [facility];  Service:               Cardiovascular;  Laterality: Right; 12/04/2023: LOWER EXTREMITY ANGIOGRAPHY; Right     Comment:  Procedure: Lower Extremity Angiography;  Surgeon:               [name], MD;  Location: [facility];               Service: Cardiovascular;  Laterality: Right; 06/15/2021: LOWER EXTREMITY INTERVENTION; Right     Comment:  Procedure: LOWER EXTREMITY INTERVENTION;  Surgeon: [name], MD;  Location: [facility];  Service:               Cardiovascular;  Laterality: Right; 07/14/2023: RIGHT/LEFT HEART CATH AND CORONARY ANGIOGRAPHY; N/A     Comment:  Procedure: RIGHT/LEFT HEART CATH AND CORONARY               ANGIOGRAPHY;  Surgeon: [name], MD;  Location:               [facility];  Service: Cardiovascular;                Laterality: N/A; No date: TONSILLECTOMY 02/17/2023: TRANSMETATARSAL AMPUTATION; Right     Comment:  Procedure: TRANSMETATARSAL AMPUTATION;  Surgeon: Lennie,  Prentice, [name];  Location: [facility];  Service:               Orthopedics/Podiatry;   Laterality: Right; 01/09/2023: WOUND DEBRIDEMENT; Right     Comment:  Procedure: DEBRIDEMENT WOUND;  Surgeon: [name],               MD;  Location: [facility];  Service: Vascular;  Laterality:              Right;  BMI    Body Mass Index: 31.14 kg/m      Reproductive/Obstetrics negative OB ROS                              Anesthesia Physical Anesthesia Plan  ASA: 3  Anesthesia Plan: General   Post-op Pain Management:    Induction: Intravenous  PONV Risk Score and Plan: Propofol  infusion and TIVA  Airway Management Planned: Natural Airway and Nasal Cannula  Additional Equipment:   Intra-op Plan:   Post-operative Plan:   Informed Consent: I have reviewed the patients History and Physical, chart, labs and discussed the procedure including the risks, benefits and alternatives for the proposed anesthesia with the patient or authorized representative who has indicated his/her understanding and acceptance.     Dental Advisory Given  Plan Discussed with: Anesthesiologist, CRNA and Surgeon  Anesthesia Plan Comments: (Patient consented for risks of anesthesia including but not limited to:  - adverse reactions to medications - risk of airway placement if required - damage to eyes, teeth, lips or other oral mucosa - nerve damage due to positioning  - sore throat or hoarseness - Damage to heart, brain, nerves, lungs, other parts of body or loss of life  Patient voiced understanding and assent.)         Anesthesia Quick Evaluation

## 2023-12-28 NOTE — Progress Notes (Signed)
 [facility]  Daily Progress Note   Subjective  -   No right groin pain overnight.  Drainage is very lightly blood tinged and clearly mostly serous.  Severe itching for past 5 days is severe today.  No other major complaints.  His wife is at bedside and has been counseled and all questions answered.  Objective Vitals:   12/27/23 1538 12/27/23 2121 12/28/23 0401 12/28/23 0729  BP: (!) 143/70 (!) 140/65 (!) 142/82 (!) 142/81  Pulse: (!) 57 80 81 82  Resp: 14 17 19    Temp: 97.7 F (36.5 C) 98.5 F (36.9 C) 98.6 F (37 C)   TempSrc:  Oral Oral   SpO2: 100% 97% 98%   Weight:      Height:        Intake/Output Summary (Last 24 hours) at 12/28/2023 0837 Last data filed at 12/28/2023 0600 Gross per 24 hour  Intake 300 ml  Output 600 ml  Net -300 ml    PULM  CTAB CV  RRR VASC  Warm right BKA with unremarkable VAC but has significant thigh induration but similar to yesterday.  Laboratory CBC    Component Value Date/Time   WBC 8.0 12/27/2023 1354   HGB 10.1 (L) 12/27/2023 1354   HGB 15.7 02/16/2012 2013   HCT 31.1 (L) 12/27/2023 1354   HCT 44.4 02/16/2012 2013   PLT 226 12/27/2023 1354   PLT 176 02/16/2012 2013    BMET    Component Value Date/Time   NA 134 (L) 12/27/2023 1354   NA 137 07/21/2023 1246   NA 137 02/16/2012 2013   K 4.4 12/27/2023 1354   K 3.8 02/16/2012 2013   CL 101 12/27/2023 1354   CL 102 02/16/2012 2013   CO2 26 12/27/2023 1354   CO2 25 02/16/2012 2013   GLUCOSE 135 (H) 12/27/2023 1354   GLUCOSE 154 (H) 02/16/2012 2013   BUN 13 12/27/2023 1354   BUN 30 (H) 07/21/2023 1246   BUN 10 02/16/2012 2013   CREATININE 0.93 12/27/2023 1354   CREATININE 0.72 02/16/2012 2013   CALCIUM  8.4 (L) 12/27/2023 1354   CALCIUM  9.6 02/16/2012 2013   GFRNONAA >60 12/27/2023 1354   GFRNONAA >60 02/16/2012 2013   GFRAA >60 12/09/2018 0929   GFRAA >60 02/16/2012 2013    Assessment/Planning: POD #21 s/p Infected right CFA PSA repair  He is  marked and ready to go.  Type and screen is done.  No additional questions.  Plan is VAC change with wound exploration to see what explains increased drainage.   Manuel [name]  12/28/2023, 8:37 AM

## 2023-12-28 NOTE — Op Note (Signed)
 [facility]  Operative Note  Manuel [name] 03-21-1946 [id number] 12/28/2023   Pre-op Dx: Infected aterial right groin wound Post-op Dx: I & D right groin with wound vac exchange  Procedures: Procedure(s): DEBRIDEMENT, INGUINAL REGION Right  Surgeon(s): Elnor Norleen CROME, MD  Assistant: none  Choice  Complications: None  Estimated Blood Loss: per Anesthesia note  Specimens: deep cavity fibrin for microbiology  Findings:deep Large deep lateral space filled with fibrin and seroma.  Evacuated and retraining suture removed to allow packing.  Pulse not palpable in the wound.  Uppermost aspect of wound also with undrained deepr portion.  Packed with VAC sponge after intraoperative debtridement of fascia/muscle and subcutaneous tissue.  Wound measurement 17 by 9 cm by 8 cm deep.  Indications: Manuel [name] is a 78 y.o. male He has had a serratia infected patch on the right CFA blow out with repair 2 weeks ago with endarterectomized SFA and a sartorius flap.  He has had marked increase in serosanguineous drainage and an episode of severe pain following his recent CT scan which shows undrained collections with gas bubbles.  Plan is exploration of the wound today in the OR in case there is bleeding.  Procedure Detail: The patient was identified in the holding area and taken to [facility] OR ROOM 09  The patient was then placed supine on the table. IV sedation anesthesia was administered.  The patient was prepped and draped in the usual sterile fashion.  A time out was called and antibiotics were administered.    The VAC was removed.  Two areas of serous drainage were visually identified at the uppermost incision and in the mid-lateral position.  The areas were probed with a Q-tip and then digitally.  Only clear serous drainage was seen.  There is a large collection of white fibrinous material in the mid-deep collection.  A scissors and forceps were used to debride the fibrin  and a sample was sent for microbiology.  I cannot palpate a pulse in the wound even deep.  It extends laterally not medially and likely is well away from the arterial repair.  In the upper and mid-incisions some subcutaneous material and fascia and muscle were debrided as non-viable.  The majority of the wound is well-vascularized and viable.  Hemostasis was obtained with cautery and packing.  The suture in the muscle was obstructing entry into the deep mid-cavity so it was cute and replaced with two Monocryl sutures at the superior border to prevent unzipping.  The wound was irrigated with a liter of Vashe.  A medium VAC sponge was cut to shape and used to pack the upper and mid-incisions.  Acceptable seal was achieved with mastisol and occlusive dressings and the VAC restarted at 125 mm Hg.  Disposition: To PACU in stable condition.  Norleen CROME Elnor, MD 12/28/2023 9:56 AM

## 2023-12-28 NOTE — Progress Notes (Signed)
 [facility]  Daily Progress Note   Subjective  -   No complaints after right groin debridement/exploration and VAC change  Objective Vitals:   12/28/23 0945 12/28/23 1000 12/28/23 1015 12/28/23 1030  BP: (!) 150/74 (!) 140/81 (!) 154/86 (!) 156/78  Pulse: 93 89 87 83  Resp: 14 12 13  (!) 35  Temp: (!) 97.5 F (36.4 C)  (!) 97.4 F (36.3 C)   TempSrc:      SpO2: 96% 100% 100% 100%  Weight:      Height:        Intake/Output Summary (Last 24 hours) at 12/28/2023 1130 Last data filed at 12/28/2023 0932 Gross per 24 hour  Intake 600 ml  Output 600 ml  Net 0 ml    PULM  CTAB CV  RRR VASC  VAC dressing intact with good seal and warm BKA on Right.  Laboratory CBC    Component Value Date/Time   WBC 8.0 12/27/2023 1354   HGB 10.1 (L) 12/27/2023 1354   HGB 15.7 02/16/2012 2013   HCT 31.1 (L) 12/27/2023 1354   HCT 44.4 02/16/2012 2013   PLT 226 12/27/2023 1354   PLT 176 02/16/2012 2013    BMET    Component Value Date/Time   NA 134 (L) 12/27/2023 1354   NA 137 07/21/2023 1246   NA 137 02/16/2012 2013   K 4.4 12/27/2023 1354   K 3.8 02/16/2012 2013   CL 101 12/27/2023 1354   CL 102 02/16/2012 2013   CO2 26 12/27/2023 1354   CO2 25 02/16/2012 2013   GLUCOSE 135 (H) 12/27/2023 1354   GLUCOSE 154 (H) 02/16/2012 2013   BUN 13 12/27/2023 1354   BUN 30 (H) 07/21/2023 1246   BUN 10 02/16/2012 2013   CREATININE 0.93 12/27/2023 1354   CREATININE 0.72 02/16/2012 2013   CALCIUM  8.4 (L) 12/27/2023 1354   CALCIUM  9.6 02/16/2012 2013   GFRNONAA >60 12/27/2023 1354   GFRNONAA >60 02/16/2012 2013   GFRAA >60 12/09/2018 0929   GFRAA >60 02/16/2012 2013    Assessment/Planning: POD #0 s/p Right groin exploration and VAC change with seroma cavity drainage and debridement.  He has severe pruritus.  It began about the tie he was switched to Invanz .  I recommend we try another ABX and see if the pruritus remits. I would do a bedside VAC change on Tuesday  and if it goes well he can be discharged if all else is OK.  Be sure to pack the sponge into the upper small cavity and mi-larger cavity to ensure adequate drainage and resolution.  Norleen LITTIE Pereyra  12/28/2023, 11:30 AM

## 2023-12-28 NOTE — Plan of Care (Signed)
 Problem: Education: Goal: Knowledge of General Education information will improve Description: Including pain rating scale, medication(s)/side effects and non-pharmacologic comfort measures Outcome: Progressing   Problem: Health Behavior/Discharge Planning: Goal: Ability to manage health-related needs will improve Outcome: Progressing   Problem: Clinical Measurements: Goal: Ability to maintain clinical measurements within normal limits will improve Outcome: Progressing Goal: Will remain free from infection Outcome: Progressing Goal: Diagnostic test results will improve Outcome: Progressing Goal: Respiratory complications will improve Outcome: Progressing Goal: Cardiovascular complication will be avoided Outcome: Progressing   Problem: Activity: Goal: Risk for activity intolerance will decrease Outcome: Progressing   Problem: Nutrition: Goal: Adequate nutrition will be maintained Outcome: Progressing   Problem: Coping: Goal: Level of anxiety will decrease Outcome: Progressing   Problem: Elimination: Goal: Will not experience complications related to bowel motility Outcome: Progressing Goal: Will not experience complications related to urinary retention Outcome: Progressing   Problem: Pain Managment: Goal: General experience of comfort will improve and/or be controlled Outcome: Progressing   Problem: Safety: Goal: Ability to remain free from injury will improve Outcome: Progressing   Problem: Skin Integrity: Goal: Risk for impaired skin integrity will decrease Outcome: Progressing   Problem: Education: Goal: Ability to describe self-care measures that may prevent or decrease complications (Diabetes Survival Skills Education) will improve Outcome: Progressing Goal: Individualized Educational Video(s) Outcome: Progressing   Problem: Coping: Goal: Ability to adjust to condition or change in health will improve Outcome: Progressing   Problem: Fluid  Volume: Goal: Ability to maintain a balanced intake and output will improve Outcome: Progressing   Problem: Health Behavior/Discharge Planning: Goal: Ability to identify and utilize available resources and services will improve Outcome: Progressing Goal: Ability to manage health-related needs will improve Outcome: Progressing   Problem: Metabolic: Goal: Ability to maintain appropriate glucose levels will improve Outcome: Progressing   Problem: Nutritional: Goal: Maintenance of adequate nutrition will improve Outcome: Progressing Goal: Progress toward achieving an optimal weight will improve Outcome: Progressing   Problem: Skin Integrity: Goal: Risk for impaired skin integrity will decrease Outcome: Progressing   Problem: Tissue Perfusion: Goal: Adequacy of tissue perfusion will improve Outcome: Progressing   Problem: Education: Goal: Understanding of CV disease, CV risk reduction, and recovery process will improve Outcome: Progressing Goal: Individualized Educational Video(s) Outcome: Progressing   Problem: Activity: Goal: Ability to return to baseline activity level will improve Outcome: Progressing   Problem: Cardiovascular: Goal: Ability to achieve and maintain adequate cardiovascular perfusion will improve Outcome: Progressing Goal: Vascular access site(s) Level 0-1 will be maintained Outcome: Progressing   Problem: Health Behavior/Discharge Planning: Goal: Ability to safely manage health-related needs after discharge will improve Outcome: Progressing   Problem: Education: Goal: Ability to describe self-care measures that may prevent or decrease complications (Diabetes Survival Skills Education) will improve Outcome: Progressing Goal: Individualized Educational Video(s) Outcome: Progressing   Problem: Cardiac: Goal: Ability to maintain an adequate cardiac output will improve Outcome: Progressing   Problem: Health Behavior/Discharge Planning: Goal:  Ability to identify and utilize available resources and services will improve Outcome: Progressing Goal: Ability to manage health-related needs will improve Outcome: Progressing   Problem: Fluid Volume: Goal: Ability to achieve a balanced intake and output will improve Outcome: Progressing   Problem: Metabolic: Goal: Ability to maintain appropriate glucose levels will improve Outcome: Progressing   Problem: Nutritional: Goal: Maintenance of adequate nutrition will improve Outcome: Progressing Goal: Maintenance of adequate weight for body size and type will improve Outcome: Progressing   Problem: Respiratory: Goal: Will regain and/or maintain adequate ventilation Outcome:  Progressing   Problem: Urinary Elimination: Goal: Ability to achieve and maintain adequate renal perfusion and functioning will improve Outcome: Progressing

## 2023-12-28 NOTE — Transfer of Care (Signed)
 Immediate Anesthesia Transfer of Care Note  Patient: Manuel [name]  Procedure(s) Performed: DEBRIDEMENT, INGUINAL REGION (Right)  Patient Location: PACU  Anesthesia Type:General  Level of Consciousness: drowsy  Airway & Oxygen Therapy: Patient Spontanous Breathing and Patient connected to face mask oxygen  Post-op Assessment: Report given to RN  Post vital signs: stable  Last Vitals:  Vitals Value Taken Time  BP 150/74 12/28/23 09:46  Temp    Pulse 91 12/28/23 09:51  Resp 14 12/28/23 09:51  SpO2 99 % 12/28/23 09:51  Vitals shown include unfiled device data.  Last Pain:  Vitals:   12/28/23 0446  TempSrc:   PainSc: Asleep      Patients Stated Pain Goal: 0 (12/18/23 0900)  Complications: No notable events documented.

## 2023-12-29 ENCOUNTER — Encounter: Payer: Self-pay | Admitting: Vascular Surgery

## 2023-12-29 DIAGNOSIS — L299 Pruritus, unspecified: Secondary | ICD-10-CM

## 2023-12-29 DIAGNOSIS — T827XXA Infection and inflammatory reaction due to other cardiac and vascular devices, implants and grafts, initial encounter: Secondary | ICD-10-CM | POA: Diagnosis not present

## 2023-12-29 DIAGNOSIS — A498 Other bacterial infections of unspecified site: Secondary | ICD-10-CM | POA: Diagnosis not present

## 2023-12-29 DIAGNOSIS — R7881 Bacteremia: Secondary | ICD-10-CM | POA: Diagnosis not present

## 2023-12-29 LAB — GLUCOSE, CAPILLARY
Glucose-Capillary: 124 mg/dL — ABNORMAL HIGH (ref 70–99)
Glucose-Capillary: 149 mg/dL — ABNORMAL HIGH (ref 70–99)
Glucose-Capillary: 93 mg/dL (ref 70–99)
Glucose-Capillary: 142 mg/dL — ABNORMAL HIGH (ref 70–99)

## 2023-12-29 MED ORDER — POLYETHYLENE GLYCOL 3350 17 G PO PACK
17.0000 g | PACK | Freq: Every day | ORAL | Status: DC
Start: 2023-12-30 — End: 2024-01-02
  Administered 2023-12-30 – 2024-01-01 (×3): 17 g via ORAL
  Filled 2023-12-29 (×3): qty 1

## 2023-12-29 MED ORDER — DOCUSATE SODIUM 100 MG PO CAPS: 100.0000 mg | ORAL_CAPSULE | Freq: Two times a day (BID) | ORAL | Status: DC | PRN

## 2023-12-29 NOTE — Progress Notes (Signed)
 [facility]  - [place] [facility]   PATIENT NAME: Manuel [name]    MR#:  [id number]  DATE OF BIRTH:  1946/04/14  SUBJECTIVE:  Pt's wife at bedside today patient had another wound VAC changed today per wound nurse. He is still draining significant amount of serosanginous fluid.  Discussed with wife at bedside along with patient need for close monitoring and prevent infection of the wound site and to consider going to LTAC/rehab as opposed to home. Wife and patient agreeable. Patient is agreeable to continue Ertapenem   for now  VITALS:  Blood pressure (!) 141/71, pulse 62, temperature 98.1 F (36.7 C), resp. rate 18, height 5' 8 (1.727 m), weight 92.9 kg, SpO2 99%.  PHYSICAL EXAMINATION:  GENERAL:  78 y.o.-year-old patient with no acute distress. Chronically ill LUNGS: Normal breath sounds bilaterally, no wheezing CARDIOVASCULAR: S1, S2 normal. No murmur   ABDOMEN: Soft, nontender, nondistended. Bowel sounds present.  EXTREMITIES: right groin wound vac, right BKA/prosthetic NEUROLOGIC: nonfocal  patient is alert and awake    LABORATORY PANEL:  CBC Recent Labs  Lab 12/27/23 1354  WBC 8.0  HGB 10.1*  HCT 31.1*  PLT 226    Chemistries  Recent Labs  Lab 12/27/23 1354  NA 134*  K 4.4  CL 101  CO2 26  GLUCOSE 135*  BUN 13  CREATININE 0.93  CALCIUM  8.4*   Cardiac Enzymes No results for input(s): TROPONINI in the last 168 hours. RADIOLOGY:  No results found.   Assessment and Plan  78 y.o. 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 Patient fell with walker landing on top of him.    8/27.  Patient to go to the operating room for  first wound VAC change. 8/28.  Patient can transfer out of the ICU.  Vascular team plans on taking back to the OR on Tuesday for wound VAC change. 8/29.  Patient and wife interested in going home after hospitalization.  Will need IV antibiotics upon discharge.  Nonsustained ventricular tachycardia 8 beats.  Patient given IV magnesium . 8/30.  Patient qualifies for nocturnal oxygen. 8/31.  Patient wants to go home rather than acute inpatient rehab. 9/1.  Will restart MiraLAX .  Hemoglobin 9.3.  PICC line placed. 9/2.  Patient told me this morning that he wanted to go to acute inpatient rehab.  Dr. [name] changed the wound VAC in the operating room and he is okay with nursing staff changing moving forward. 9/3--I assumed care of patient. C/o itching over his body. Did not participate with PT today. Per vascular ok to d/c to rehab. CIR was denied by insurance 9/4-- sitting up with the recliner chair. Had a meaningful conversation. Discussed about discharge planning to rehab patient agreeable. Discussed with wife. Patient states he is been feeling a bit sleepy. I tried to minimize and discontinue sedating meds. Patient has history of sleep apnea he is in the process of getting CPAP at home 9/5-- patient and wife declined to rehab. They are going home. Pam from infusion therapy will do education with wife. Home health arranged. Wound VAC changed by wound nurse. 9/6-- given increase wound VAC output I  discussed with Dr. Elnor vascular on call. Repeated CT pelvis that shows collection of fluid around the surgical site. Dr. Elnor to evaluate. Patient complained of excruciating pain in the right thigh received two of morphine  and two of Ativan  which helped ease down. Discharge cancel 9/7-- underwent groin debridement of seroma and placement of Wound vac by Dr elnor. Recommends change of Vac on Tuesday 9/8--Wound vac changed again today by WOC. Dr MICKEY recs IV abxs till 01/22/24. Patient and wife agreeable with discharge to  LTAC.     Assessment and Plan: Rupture of femoral artery (HCC) 8/14.  Patient had percutaneous transluminal angioplasty and stent placement of the right profunda femoris and common femoral artery extending into the distal right external iliac artery. 8/21.  Right external iliac artery to profunda femoris artery bypass with autologous ipsilateral superficial femoral artery, resection of infected right, femoral artery and removal of common femoral artery, profunda femoris artery and superficial femoral artery stents.  Harvest of right superficial femoral artery for bypass with endarterectomy of the right superficial femoral artery. 8/27.  Wound VAC changed in the operating room 9/2.  Wound VAC changed in the operating room.  Dr. [name] gave clearance to follow-up as outpatient and have nursing staff change wound VAC moving forward. 9/5 wound vac changed today by WOC RN.  9/6-- CT pelvis shows significant fluid collection around the surgical site, await vascular to evaluate.  CT 12/27/2023    Severe sepsis (HCC) --Secondary to Serratia.  Present on admission.   --Currently on Maxipime  as per ID.  -- Will need total of 4 to 6 weeks of IV antibiotics with ertapenem .  End date for IV abx 01/22/24 --PICC line placed on 9/1.    -- follow-up with Dr. Searcy.   Hemorrhagic shock (HCC) Resolved.   Chronic systolic CHF (congestive heart failure) (HCC) Last EF 20%, continue low-dose Coreg , enteresto,farxiga  and Aldactone  Pt to follow with [facility] cardiology Dr [name]   Uncontrolled type 2 diabetes mellitus with hyperglycemia, with long-term current use of insulin  (HCC) Cont present Insulin  dosing short and long acting   NSTEMI (non-ST elevated myocardial infarction) (HCC) Secondary to hemorrhagic shock.  Patient on aspirin , Plavix , Coreg  and statin.  Resumed enteresto and farxiga    PAD (peripheral artery disease) (HCC) Continue aspirin  and Plavix , history of right BKA.   AKI (acute kidney injury)  (HCC) With metabolic acidosis.  Resolved.     Hyperkalemia Resolved   Sleep apnea Will need outpatient sleep study.  With nocturnal hypoxia with overnight oximetry he qualifies for  oxygen.   History of alcohol  abuse On thiamine    Iron  deficiency anemia Last hemoglobin 10.3  Received 3 units of blood during the hospital course. --resumed po iron        Procedures: as per vascular sx Family communication :wife [name] at bedside Consults : vascular, infectious disease CODE STATUS: DNR DVT Prophylaxis : Lovenox   Level of care: Med-Surg Status is: Inpatient continue IV antibiotics, wound VAC change and TOC working to get patient to LTAC.    TOTAL TIME TAKING CARE OF THIS PATIENT: 35 minutes.  >50% time spent on counselling and coordination of care  Note: This dictation was prepared with Dragon dictation along with smaller phrase technology. Any transcriptional errors that result from this process are unintentional.  [name] M.D    [facility]   CC: Primary care physician; [name], [name], MD

## 2023-12-29 NOTE — Progress Notes (Signed)
 Heart Failure Stewardship Pharmacy Note  PCP: [name], [name], MD PCP-Cardiologist: None  HPI: Manuel [name] is a 78 y.o. male with CAD, PAD s/p right BKA, gout, hyperlipidemia, hypertension, depression, anxiety, insomnia who presented with leg pain after a fall. CT scan revealed large right common femoral hematoma with active extravasation, treated with multiple endovascular procedures. Admission complicated by infection with serratia  Pertinent cardiac history: CAD s/p PCI to [facility] with overlapping stents in 2011. extensive PAD s/p PTA L common Iliac, R peroneal, R distal SFA/prox popliteal, and stent to R distal SFA/prox popliteal arteries (05/14/21). Echo in 10/2022 with LVEF of 25-30%. Echo 06/2023 with LVEF 25-30%, moderately dilated LV, and mild valvular disease. LHC 06/2023 where 3 DES were placed to the LAD. Femoral artery stent placed 11/2023 after traumatic fall. Echo 11/2023 showed LVEF of 20-25% with G1DD, and mild MR.  Pertinent Lab Values: Creatinine  Date Value Ref Range Status  02/16/2012 0.72 0.60 - 1.30 mg/dL Final   Creatinine, Ser  Date Value Ref Range Status  12/27/2023 0.93 0.61 - 1.24 mg/dL Final   BUN  Date Value Ref Range Status  12/27/2023 13 8 - 23 mg/dL Final  96/68/7974 30 (H) 8 - 27 mg/dL Final  89/72/7986 10 7 - 18 mg/dL Final   Potassium  Date Value Ref Range Status  12/27/2023 4.4 3.5 - 5.1 mmol/L Final  02/16/2012 3.8 3.5 - 5.1 mmol/L Final   Sodium  Date Value Ref Range Status  12/27/2023 134 (L) 135 - 145 mmol/L Final  07/21/2023 137 134 - 144 mmol/L Final  02/16/2012 137 136 - 145 mmol/L Final   B Natriuretic Peptide  Date Value Ref Range Status  12/04/2023 3,374.0 (H) 0.0 - 100.0 pg/mL Final    Comment:    Performed at [facility], [address] [place], [place] [address]   Magnesium   Date Value Ref Range Status  12/15/2023 2.2 1.7 - 2.4 mg/dL Final    Comment:    Performed at [facility], [address] [place], [place] [address]   Hemoglobin A1C  Date Value Ref Range Status  07/28/2023 8.0 (A) 4.0 - 5.6 % Final   Hgb A1c MFr Bld  Date Value Ref Range Status  07/10/2023 8.0 (H) 4.8 - 5.6 % Final    Comment:    (NOTE) Pre diabetes:          5.7%-6.4%  Diabetes:              >6.4%  Glycemic control for   <7.0% adults with diabetes    TSH  Date Value Ref Range Status  04/19/2023 1.416 0.350 - 4.500 uIU/mL Final    Comment:    Performed by a 3rd Generation assay with a functional sensitivity of <=0.01 uIU/mL. Performed at [facility], [address] [place], [place] [address]     Vital Signs: Temp:  [97.4 F (36.3 C)-98.3 F (36.8 C)] 98.1 F (36.7 C) (09/08 0758) Pulse Rate:  [62-93] 62 (09/08 0758) Cardiac Rhythm: Normal sinus rhythm (09/07 1033) Resp:  [12-35] 18 (09/08 0758) BP: (137-156)/(71-86) 141/71 (09/08 0758) SpO2:  [94 %-100 %] 99 % (09/08 0758)  Intake/Output Summary (Last 24 hours) at 12/29/2023 9177 Last data filed at 12/29/2023 0700 Gross per 24 hour  Intake 540 ml  Output 950 ml  Net -410 ml    Current Heart Failure Medications:  Loop diuretic: none Beta-Blocker: carvedilol  3.125 mg BID ACEI/ARB/ARNI: losartan  25 mg at bedtime MRA:  spironolactone  25 mg daily SGLT2i: Farxiga  10 mg daily Other: none   Prior to admission Heart Failure Medications:  Loop diuretic: furosemide  40 mg daily Beta-Blocker: carvedilol  25 mg BID ACEI/ARB/ARNI: Entresto  24-26 mg BID MRA: spironolactone  50 mg daily SGLT2i: Farxiga  10 mg daily Other: none  Assessment: 1. 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 NYHA class III symptoms.  -Symptoms: Patient is somnolent today and difficult to arouse.  -Volume: Appears euvolemic. Stable off furosemide  at this time. -Hemodynamics: BP is elevated. HR 60-80s.  -BB: Continue carvedilol  3.125 mg BID.  -ACEI/ARB/ARNI: Currently on losartan  rather than home  Entresto . Given that BP is elevated and patient had recent femoral artery rupture, would consider resuming Entresto  for BP and reduction of shear stress. -MRA: Continue spironolactone  25 mg daily. -SGLT2i: PTA Farxiga  held with sepsis. Can consider resuming at a later date if appropriate.  Plan: 1) Medication changes recommended at this time: -Consider transition from losartan  back to home Entresto  24-26 mg BID.  2) Patient assistance: -Entresto  is $47.00 -Farxiga  is $47.00 for brand and generic not covered -Jardiance is $47.00   3) Education: -To be completed prior to discharge.  Medication Assistance / Insurance Benefits Check: Does the patient have prescription insurance?   Please do not hesitate to reach out with questions or concerns,  Jaun Bash, PharmD, CPP, BCPS, [facility] Heart Failure Pharmacist  Phone - [phone] 12/29/2023 1:01 PM

## 2023-12-29 NOTE — Consult Note (Addendum)
 WOC Nurse Consult Note: Reason for Consult: NPWT on right groin. Wound type: Surgical. Infected arteria. Pressure Injury POA: NA Measurement: 17 x 9 x 3 cm. Tunneling 5 cm at 1 o'clock, and 3.5 cm in the center of the wound. Wound bed: 90% red, granulated tissue. 10% yellow/gray. Drainage (amount, consistency, odor) Copious amount. 100 ml on canister at 1340. Periwound: redness, moisture in crease at 3 and 9 o'clock. Edema present. Dressing procedure/placement/frequency: Removed old NPWT dressing Cleansed wound with normal saline Filled wound with 2 piece of black foam  Applied a 1/2 ring in crease at 3 and another 1/2 ring at 9 o'clock.  Sealed NPWT dressing at HG Patient received IV pain medication per bedside nurse prior to dressing change Patient tolerated procedure well.  WOC nurse will continue to provide NPWT dressing changed due to the complexity of the dressing change.       Supplies in room.  WOC team will follow THURS.  Please reconsult if further assistance is needed. Thank-you,  Lela Holm RN, CNS, [employer], MSN.  (Phone [phone])

## 2023-12-29 NOTE — Progress Notes (Signed)
 Date of Admission:  12/04/2023      ID: Manuel [name] is a 78 y.o. male  Principal Problem:   Rupture of femoral artery (HCC) Active Problems:   PAD (peripheral artery disease) (HCC)   Chronic systolic CHF (congestive heart failure) (HCC)   AKI (acute kidney injury) (HCC)   Hyponatremia   Uncontrolled type 2 diabetes mellitus with hyperglycemia, with long-term current use of insulin  (HCC)   NSTEMI (non-ST elevated myocardial infarction) (HCC)   Ischemic cardiomyopathy   Coronary artery disease involving native coronary artery of native heart   Serratia infection   Vascular graft infection (HCC)   Hemorrhagic shock (HCC)   Severe sepsis (HCC)   Iron  deficiency anemia   History of alcohol  abuse   Sleep apnea   Hyperkalemia   Gram-negative bacteremia   Diarrhea   NSVT (nonsustained ventricular tachycardia) (HCC)    Subjective: On Saturday patient had itching over the whole body which has almost resolved now On Sunday he was taken back to the OR for the seroma and had exploration and closure   Medications:   aspirin  EC  81 mg Oral Daily   atorvastatin   80 mg Oral QHS   carvedilol   3.125 mg Oral BID WC   Chlorhexidine  Gluconate Cloth  6 each Topical Daily   clopidogrel   75 mg Oral Daily   enoxaparin  (LOVENOX ) injection  40 mg Subcutaneous Q24H   escitalopram   5 mg Oral Daily   feeding supplement (GLUCERNA SHAKE)  237 mL Oral TID BM   ferrous sulfate   325 mg Oral Q breakfast   folic acid   1 mg Oral Daily   gabapentin   100 mg Oral QHS   insulin  aspart  0-15 Units Subcutaneous TID WC & HS   insulin  glargine  5 Units Subcutaneous QHS   losartan   25 mg Oral QHS   pantoprazole   40 mg Oral Daily   polyethylene glycol  17 g Oral Daily   sodium chloride  flush  10-40 mL Intracatheter Q12H   sodium chloride  flush  3 mL Intravenous Q12H   spironolactone   25 mg Oral Daily   thiamine   100 mg Oral Daily    Objective: Vital signs in last 24 hours: BP (!) 141/81 (BP  Location: Left Arm)   Pulse 67   Temp 98.4 F (36.9 C)   Resp 18   Ht 5' 8 (1.727 m)   Wt 92.9 kg   SpO2 96%   BMI 31.14 kg/m        PHYSICAL EXAM:  General: awake, alert,  Lungs: b/l air entry Heart: s1s2 Extremities: Right groin area has wound VAC Right BKA Neurologic: moves all limbs  Lab Results    Latest Ref Rng & Units 12/27/2023    1:54 PM 12/22/2023    5:19 AM 12/20/2023    5:41 AM  CBC  WBC 4.0 - 10.5 K/uL 8.0  6.9  6.2   Hemoglobin 13.0 - 17.0 g/dL 89.8  9.3  8.8   Hematocrit 39.0 - 52.0 % 31.1  28.6  27.4   Platelets 150 - 400 K/uL 226  217  220        Latest Ref Rng & Units 12/27/2023    1:54 PM 12/22/2023    5:19 AM 12/20/2023    5:41 AM  CMP  Glucose 70 - 99 mg/dL 864  864  79   BUN 8 - 23 mg/dL 13  23  31    Creatinine 0.61 - 1.24  mg/dL 9.06  9.22  9.21   Sodium 135 - 145 mmol/L 134  134  135   Potassium 3.5 - 5.1 mmol/L 4.4  4.6  4.5   Chloride 98 - 111 mmol/L 101  106  108   CO2 22 - 32 mmol/L 26  24  22    Calcium  8.9 - 10.3 mg/dL 8.4  8.5  8.5       Microbiology: 12/04/23 BC X 2 serratia 11/28/23 BC- NG  so far WC- x 2 serratia One of which is ceftriaxone  resistant   Assessment/Plan:  Right common femoral artery graft with rupture and leaking into the cutaneous tissue forming a hematoma.  Was taken for surgery underwent recent excision of the old graft and portions of the femoral artery and has a bypass graft with autologous external femoral artery.  Multiple cultures have been sent. Gram neg rod in gram stain  Serratia bacteremia.  Source is  the right groin bovine graft.  S/p removal of the graft Repeat blood culture neg  Patient is on ertapenem  after being on cefepime . Ertapenem  is once a day and easy to administer at home Will need 6 weeks of IV ( not 4 weeks like mentioned before) PO quinolone not an option because of femoral artery rupture  Underwent Right external iliac artery to profunda femoris bypass with autologous ipsilateral  superficial femoral artery.  Resection of infected right common femoral artery and removal of common femoral artery profunda femoris and superficial femoral artery stents. Negative pressure dressing. Surgical Cultures X2  serratia  Seroma Recent exploration over the weekend and application of the wound VAC again  Pruritus no rash It is almost resolved Observe closely as patient is on ertapenem     Anemia  Severe PAD History of bilateral endarterectomies of the right And left     Right BKA   CAD Diabetes mellitus  Discussed the management with patient and wife and hospitalist.   OPAT Orders Discharge antibiotics: Ertapenem  1 g IV every 24 hours    Duration: 6 weeks  End Date: 01/22/24     [facility] Care Per Protocol:   Labs weekly while on IV antibiotics: X__ CBC with differential   _X_ CMP       _X_ Please leave PIC in place until doctor has seen patient or been notified   Fax weekly lab results  promptly to [phone]   Clinic Follow Up Appt: 01/06/24 at 11.30 am with Dr.Ledarius Leeson     Call [phone] with nay questions or critical values

## 2023-12-29 NOTE — Plan of Care (Signed)
   Problem: Education: Goal: Knowledge of General Education information will improve Description Including pain rating scale, medication(s)/side effects and non-pharmacologic comfort measures Outcome: Progressing

## 2023-12-29 NOTE — Progress Notes (Signed)
 Progress Note    12/29/2023 8:27 PM 1 Day Post-Op  Subjective:   Manuel [name] is now POD #1 from:  Procedures: Procedure(s): DEBRIDEMENT, INGUINAL REGION Right. I&D right groin with wound vac change.   POD #6   PROCEDURE: Irrigation and debridement of right groin wound with VAC dressing change   POD #13 PROCEDURE: 1.   Right groin reexploration for bleeding 2.   Evacuation of right groin hematoma 3.   Suture repair of proximal bypass anastomosis in the distal external iliac artery 4.   Replacement of the sartorius flap for coverage 5.   Placement of gentamicin  and vancomycin  antibiotic impregnated beads 6.   Negative pressure dressing placement right groin   Patient is now POD #10    PROCEDURE: Irrigation and debridement of skin, soft tissue, and muscle of the right groin wound.  Placement of negative pressure dressing right groin.   Patient is resting comfortably in bed this morning in ICU eating breakfast. Patients wife is at his side this morning. No complaints overnight and vitals all remain stable.    Patient is resting comfortably in bed this morning.  Wound VAC is in place holding suction.  Drainage noted to be some serosanguineous only.  Vitals are remained stable.     Vitals:   12/29/23 1740 12/29/23 1955  BP: (!) 141/81 134/74  Pulse: 67 63  Resp: 18 16  Temp: 98.4 F (36.9 C) 98.3 F (36.8 C)  SpO2: 96% 95%   Physical Exam: Cardiac:  RRR.  Normal S1 and S2 no murmurs appreciated. Lungs: Lungs on auscultation are rhonchorous throughout.  Diminished in the bases.  No rales or wheezing noted.  Normal labored breathing on 2 L nasal cannula oxygen today Incisions: Right groin postoperative incision with wound VAC in place working well. Extremities: Bilateral lower extremities warm to touch with Doppler pulses. Abdomen: Positive bowel sounds throughout, soft, nontender nondistended. Neurologic: Alert and oriented x 3, answers all questions follows commands  appropriately  CBC    Component Value Date/Time   WBC 8.0 12/27/2023 1354   RBC 3.47 (L) 12/27/2023 1354   HGB 10.1 (L) 12/27/2023 1354   HGB 15.7 02/16/2012 2013   HCT 31.1 (L) 12/27/2023 1354   HCT 44.4 02/16/2012 2013   PLT 226 12/27/2023 1354   PLT 176 02/16/2012 2013   MCV 89.6 12/27/2023 1354   MCV 91 02/16/2012 2013   MCH 29.1 12/27/2023 1354   MCHC 32.5 12/27/2023 1354   RDW 15.5 12/27/2023 1354   RDW 13.0 02/16/2012 2013   LYMPHSABS 0.5 (L) 12/04/2023 1141   MONOABS 0.5 12/04/2023 1141   EOSABS 0.1 12/04/2023 1141   BASOSABS 0.0 12/04/2023 1141    BMET    Component Value Date/Time   NA 134 (L) 12/27/2023 1354   NA 137 07/21/2023 1246   NA 137 02/16/2012 2013   K 4.4 12/27/2023 1354   K 3.8 02/16/2012 2013   CL 101 12/27/2023 1354   CL 102 02/16/2012 2013   CO2 26 12/27/2023 1354   CO2 25 02/16/2012 2013   GLUCOSE 135 (H) 12/27/2023 1354   GLUCOSE 154 (H) 02/16/2012 2013   BUN 13 12/27/2023 1354   BUN 30 (H) 07/21/2023 1246   BUN 10 02/16/2012 2013   CREATININE 0.93 12/27/2023 1354   CREATININE 0.72 02/16/2012 2013   CALCIUM  8.4 (L) 12/27/2023 1354   CALCIUM  9.6 02/16/2012 2013   GFRNONAA >60 12/27/2023 1354   GFRNONAA >60 02/16/2012 2013   GFRAA >60 12/09/2018  9070   GFRAA >60 02/16/2012 2013    INR    Component Value Date/Time   INR 1.3 (H) 12/12/2023 0816     Intake/Output Summary (Last 24 hours) at 12/29/2023 2027 Last data filed at 12/29/2023 1700 Gross per 24 hour  Intake 240 ml  Output 800 ml  Net -560 ml     Assessment/Plan:  78 y.o. male is s/p SEE ABOVE 1 Day Post-Op  PLAN Wound care nurse changed wound VAC today.  If patient remains in the hospital wound VAC will be changed again on Thursday.  I changed the wound VAC container while examining the patient this morning.  The container was full with 300 cc of serosanguineous fluid.  I had a long detailed discussion at the bedside this afternoon with the patient and his wife  regarding placement to LTAC/rehab as opposed to going home.  Dr. Alfredia Blanch and myself agree that the patient's wife will be unable to take care of him at home due to his needs to protect him from infection at the wound site as well as wound VAC changes.  Both the patient and and his wife agree with the plan.  Moving forward we will attempt to set up some type of placement for the patient.  Okay per vascular surgery for patient to discharge to LTAC/rehab as soon as bed is available.  Patient needs to be discharged on ASA 81 mg daily, Plavix  75 mg daily, and Lipitor  80 mg daily.  Patient will follow-up with vein and vascular surgery in the clinic after going to rehab in 4 weeks.  DVT prophylaxis: Aspirin  81 mg daily, Plavix  75 mg daily and Lovenox  40 mg subcu every 24 hours.   [name] [facility] 12/29/2023 8:27 PM

## 2023-12-29 NOTE — Progress Notes (Signed)
 Occupational Therapy Re-Evaluation Patient Details Name: Manuel [name] MRN: [id number] DOB: 1945/07/15 Today's Date: 12/29/2023   History of Present Illness   Patient is an 78 year old male presenting after fall while getting out of bed to wheelchair.  Found to have right femoral a rupture, vascular consulted and performed repair. Complicated by Hemorrhagic shock requiring ICU and pressors. PMH: CHF, CAD, PAD, R BKA, HTN, gout     Clinical Impressions Pt seen for OT re-evaluation this date, pt making progress towards goals. OT POC and goals updated to reflect pt's functional status. Pt received after working with PT, session focused on seated ADL performance. Pt completes grooming tasks and UB dressing with setup; MAX A for doffing/donning L foot sock. Pt received medication prior to session for dressing/wound vac change and reports pain is controlled at 2/10. VSS on RA. Pt will continue to require MIN A +1 with use of RW for functional transfers. OT will continue to follow acutely, discharge recommendation appropriate.      If plan is discharge home, recommend the following:   A little help with walking and/or transfers;A little help with bathing/dressing/bathroom;A lot of help with bathing/dressing/bathroom;Assistance with cooking/housework;Help with stairs or ramp for entrance     Functional Status Assessment   Patient has had a recent decline in their functional status and demonstrates the ability to make significant improvements in function in a reasonable and predictable amount of time.     Equipment Recommendations   Other (comment)      Precautions/Restrictions   Precautions Precautions: Fall Recall of Precautions/Restrictions: Intact Precaution/Restrictions Comments: wound vac right groin and RLE prosthetic Restrictions Weight Bearing Restrictions Per Provider Order: No     Mobility Bed Mobility Overal bed mobility: Needs Assistance             General  bed mobility comments: Pt recieved with PT standing  with RW    Transfers Overall transfer level: Needs assistance Equipment used: Rolling walker (2 wheels) Transfers: Sit to/from Stand Sit to Stand: Min assist, From elevated surface           General transfer comment: Pt performed mobility with PT, handoff to OT end of session      Balance Overall balance assessment: Needs assistance Sitting-balance support: No upper extremity supported, Feet supported Sitting balance-Leahy Scale: Good     Standing balance support: Bilateral upper extremity supported, During functional activity, Reliant on assistive device for balance Standing balance-Leahy Scale: Fair                             ADL either performed or assessed with clinical judgement   ADL Overall ADL's : Needs assistance/impaired     Grooming: Wash/dry hands;Wash/dry face;Sitting Grooming Details (indicate cue type and reason): recliner level         Upper Body Dressing : Set up;Sitting Upper Body Dressing Details (indicate cue type and reason): doff/don gown Lower Body Dressing: Maximal assistance;Sit to/from stand Lower Body Dressing Details (indicate cue type and reason): recliner level to doff/don sock; RN notified of dressing with 8/30 date noted and bandage adhesive coming off on L heel             Functional mobility during ADLs: Contact guard assist;Rolling walker (2 wheels)       Vision Baseline Vision/History: 1 Wears glasses              Pertinent Vitals/Pain Pain Assessment Pain Assessment: 0-10  Pain Score: 2  Pain Location: R groin Pain Descriptors / Indicators: Sore Pain Intervention(s): Premedicated before session, Monitored during session, Limited activity within patient's tolerance     Extremity/Trunk Assessment Upper Extremity Assessment Upper Extremity Assessment: Generalized weakness   Lower Extremity Assessment RLE Deficits / Details: right BKA, patient  reports edema. can activate hip and knee movement LLE Deficits / Details: can complete LAQ in sitting position. generalized weakness throughout   Cervical / Trunk Assessment Cervical / Trunk Assessment: Normal   Communication Communication Communication: No apparent difficulties Factors Affecting Communication: Hearing impaired   Cognition Arousal: Alert Behavior During Therapy: WFL for tasks assessed/performed Cognition: Cognition impaired         Attention impairment (select first level of impairment): Sustained attention Executive functioning impairment (select all impairments): Problem solving OT - Cognition Comments: verbose at times                 Following commands: Intact Following commands impaired: Follows one step commands with increased time     Cueing  General Comments   Cueing Techniques: Verbal cues;Tactile cues  VSS on RA.           Home Living Family/patient expects to be discharged to:: Private residence Living Arrangements: Spouse/significant other Available Help at Discharge: Family;Available 24 hours/day Type of Home: House Home Access: Ramped entrance     Home Layout: Able to live on main level with bedroom/bathroom;Two level;Laundry or work area in Artist of Steps: 15 Alternate Level Stairs-Rails: Right;Left Bathroom Shower/Tub: Chief Strategy Officer: Handicapped height Bathroom Accessibility: Yes How Accessible: Accessible via wheelchair;Accessible via walker Home Equipment: Agricultural consultant (2 wheels);BSC/3in1;Wheelchair - manual;Cane - single point;Tub bench;Crutches   Additional Comments: He has his prosthesis since yesterday  Lives With: Spouse    Prior Functioning/Environment Prior Level of Function : Independent/Modified Independent             Mobility Comments: Pt reports he transfers confidently to/from w/c and uses prosthesis with crutches for short distances. Recent  fall during transfer resutling in hospitalization. ADLs Comments: Pt reports that he is indepenent with ADLs .    OT Problem List: Decreased strength;Impaired balance (sitting and/or standing);Decreased safety awareness;Decreased activity tolerance   OT Treatment/Interventions: Self-care/ADL training;Therapeutic exercise;Energy conservation;Therapeutic activities      OT Goals(Current goals can be found in the care plan section)   Acute Rehab OT Goals OT Goal Formulation: With patient Time For Goal Achievement: 01/12/24 Potential to Achieve Goals: Good ADL Goals Pt Will Perform Grooming: with modified independence;sitting Pt Will Perform Lower Body Dressing: with modified independence;sitting/lateral leans Pt Will Transfer to Toilet: bedside commode;with supervision Pt Will Perform Toileting - Clothing Manipulation and hygiene: with modified independence   OT Frequency:  Min 3X/week    Co-evaluation              AM-PAC OT 6 Clicks Daily Activity     Outcome Measure Help from another person eating meals?: None Help from another person taking care of personal grooming?: None Help from another person toileting, which includes using toliet, bedpan, or urinal?: A Lot Help from another person bathing (including washing, rinsing, drying)?: A Lot Help from another person to put on and taking off regular upper body clothing?: A Little Help from another person to put on and taking off regular lower body clothing?: A Lot 6 Click Score: 17   End of Session Nurse Communication: Mobility status  Activity Tolerance: Patient tolerated treatment well Patient  left: in chair;with call bell/phone within reach;with family/visitor present;with chair alarm set  OT Visit Diagnosis: Unsteadiness on feet (R26.81);Repeated falls (R29.6)                Time: 8471-8452 OT Time Calculation (min): 19 min Charges:  OT General Charges $OT Visit: 1 Visit OT Evaluation $OT Re-eval: 1 Re-eval OT  Treatments $Self Care/Home Management : 8-22 mins  Adyan Palau L. Wm Fruchter, OTR/L  12/29/23, 4:39 PM

## 2023-12-29 NOTE — TOC Progression Note (Addendum)
 Transition of Care [facility]) - Progression Note    Patient Details  Name: Manuel [name] MRN: [id number] Date of Birth: 1945/07/15  [facility] CM/SW Contact  Corean ONEIDA Haddock, RN Phone Number: 12/29/2023, 11:38 AM  Clinical Narrative:     Plan fore vac change today, and next for Thursday Home vac has been delivered Per MD request, Oddis with [facility], and Damien with [facility] for review  Update: Per Oddis armin Damien patient meets LTACH criteria.  IP Care Management and MD to meet with patient and wife to discuss     Update:  met with patient, wife, and Dr Tobie at bedside.  They would like to proceed with [facility].  Ciera with [facility] to start auth. Updated Shuan with [facility] and Pam with [facility]  Barriers to Discharge: Continued Medical Work up               Expected Discharge Plan and Services       Living arrangements for the past 2 months: Single Family Home Expected Discharge Date: 12/27/23                                     Social Drivers of Health (SDOH) Interventions SDOH Screenings   Food Insecurity: No Food Insecurity (12/05/2023)  Housing: Low Risk  (12/05/2023)  Transportation Needs: No Transportation Needs (12/05/2023)  Utilities: Not At Risk (12/05/2023)  Depression (PHQ2-9): High Risk (09/11/2023)  Financial Resource Strain: Low Risk  (07/11/2023)  Social Connections: Moderately Isolated (12/05/2023)  Tobacco Use: Low Risk  (12/23/2023)    Readmission Risk Interventions    12/05/2023    3:09 PM 07/09/2023    8:30 PM 02/18/2023   11:58 AM  Readmission Risk Prevention Plan  Transportation Screening Complete Complete   PCP or Specialist Appt within 3-5 Days   Complete  HRI or Home Care Consult   Complete  Social Work Consult for Recovery Care Planning/Counseling   Complete  Palliative Care Screening   Not Applicable  Medication Review Oceanographer) Complete Complete Complete  PCP or Specialist appointment  within 3-5 days of discharge Complete    HRI or Home Care Consult  Complete   SW Recovery Care/Counseling Consult Complete Complete   Palliative Care Screening Not Applicable Not Applicable   Skilled Nursing Facility Not Applicable Not Applicable

## 2023-12-29 NOTE — Progress Notes (Signed)
 Physical Therapy Treatment Patient Details Name: Manuel [name] MRN: [id number] DOB: August 01, 1945 Today's Date: 12/29/2023   History of Present Illness Patient is an 77 year old male presenting after fall while getting out of bed to wheelchair.  Found to have right femoral a rupture, vascular consulted and performed repair. Complicated by Hemorrhagic shock requiring ICU and pressors. PMH: CHF, CAD, PAD, R BKA, HTN, gout    PT Comments  Pt was pleasant and motivated to participate during the session and put forth good effort throughout. Pt required extra time, effort, and ultimately mod A during sup to sit bed mobility for BLE and trunk control.  Pt reported mild dizziness upon coming to sitting that resolved quickly and did not return even when transitioning to standing.  Pt required the EOB to be elevated along with min A to come to standing but once in standing with the RW was generally steady with no overt LOB.  Pt educated on step-to sequencing with the RW for improved control and stability with pt stating that he felt safer with the RW compared to the the crutches at this time.  All goals reviewed and remain appropriate at this time.  Pt will benefit from continued PT services upon discharge to safely address deficits listed in patient problem list for decreased caregiver assistance and eventual return to PLOF.      If plan is discharge home, recommend the following: A little help with walking and/or transfers;A little help with bathing/dressing/bathroom;Assist for transportation;Help with stairs or ramp for entrance;Assistance with cooking/housework   Can travel by private vehicle        Equipment Recommendations  None recommended by PT    Recommendations for Other Services       Precautions / Restrictions Precautions Precautions: Fall Recall of Precautions/Restrictions: Intact Precaution/Restrictions Comments: wound vac right groin and RLE prosthetic Restrictions Weight Bearing  Restrictions Per Provider Order: No     Mobility  Bed Mobility Overal bed mobility: Needs Assistance Bed Mobility: Supine to Sit     Supine to sit: Mod assist     General bed mobility comments: Mod A for trunk and BLE control during sup to sit    Transfers Overall transfer level: Needs assistance Equipment used: Rolling walker (2 wheels) Transfers: Sit to/from Stand Sit to Stand: Min assist, From elevated surface           General transfer comment: Min verbal cues for sequencing with good eccentric control with use of BUEs to assist    Ambulation/Gait Ambulation/Gait assistance: Contact guard assist Gait Distance (Feet): 8 Feet Assistive device: Rolling walker (2 wheels) Gait Pattern/deviations: Step-to pattern, Decreased step length - left, Decreased stance time - right Gait velocity: dcreased     General Gait Details: Step-to sequencing education provided with RW with pt generally steady with no overt LOB but fatigued quickly   Stairs             Wheelchair Mobility     Tilt Bed    Modified Rankin (Stroke Patients Only)       Balance Overall balance assessment: Needs assistance Sitting-balance support: No upper extremity supported, Feet supported Sitting balance-Leahy Scale: Good     Standing balance support: Bilateral upper extremity supported, During functional activity, Reliant on assistive device for balance Standing balance-Leahy Scale: Fair                              Musician Communication:  No apparent difficulties  Cognition Arousal: Alert Behavior During Therapy: WFL for tasks assessed/performed   PT - Cognitive impairments: No apparent impairments                         Following commands: Intact Following commands impaired: Follows one step commands with increased time    Cueing Cueing Techniques: Verbal cues, Tactile cues  Exercises Other Exercises Other Exercises: Sit to/from  stand transfer training with a RW to/from various surfaces with cues for sequencing    General Comments        Pertinent Vitals/Pain Pain Assessment Pain Assessment: 0-10 Pain Score: 2  Pain Location: R groin Pain Descriptors / Indicators: Sore Pain Intervention(s): Premedicated before session, Monitored during session    Home Living                          Prior Function            PT Goals (current goals can now be found in the care plan section) Progress towards PT goals: Not progressing toward goals - comment (limited by functional weakness and decreased activity tolerance)    Frequency    Min 2X/week      PT Plan      Co-evaluation              AM-PAC PT 6 Clicks Mobility   Outcome Measure  Help needed turning from your back to your side while in a flat bed without using bedrails?: A Little Help needed moving from lying on your back to sitting on the side of a flat bed without using bedrails?: A Lot Help needed moving to and from a bed to a chair (including a wheelchair)?: A Lot Help needed standing up from a chair using your arms (e.g., wheelchair or bedside chair)?: A Little Help needed to walk in hospital room?: A Little Help needed climbing 3-5 steps with a railing? : A Lot 6 Click Score: 15    End of Session Equipment Utilized During Treatment: Gait belt Activity Tolerance: Patient tolerated treatment well Patient left: in chair;with call bell/phone within reach;Other (comment) (Pt left with OT for session) Nurse Communication: Mobility status PT Visit Diagnosis: Difficulty in walking, not elsewhere classified (R26.2);Muscle weakness (generalized) (M62.81)     Time: 8499-8471 PT Time Calculation (min) (ACUTE ONLY): 28 min  Charges:    $Gait Training: 8-22 mins $Therapeutic Activity: 8-22 mins PT General Charges $$ ACUTE PT VISIT: 1 Visit                     D. Scott Camil Wilhelmsen PT, DPT 12/29/23, 4:10 PM

## 2023-12-30 ENCOUNTER — Encounter: Payer: Self-pay | Admitting: Vascular Surgery

## 2023-12-30 DIAGNOSIS — R7881 Bacteremia: Secondary | ICD-10-CM | POA: Diagnosis not present

## 2023-12-30 DIAGNOSIS — T827XXA Infection and inflammatory reaction due to other cardiac and vascular devices, implants and grafts, initial encounter: Secondary | ICD-10-CM | POA: Diagnosis not present

## 2023-12-30 DIAGNOSIS — A498 Other bacterial infections of unspecified site: Secondary | ICD-10-CM | POA: Diagnosis not present

## 2023-12-30 LAB — GLUCOSE, CAPILLARY
Glucose-Capillary: 109 mg/dL — ABNORMAL HIGH (ref 70–99)
Glucose-Capillary: 115 mg/dL — ABNORMAL HIGH (ref 70–99)
Glucose-Capillary: 143 mg/dL — ABNORMAL HIGH (ref 70–99)
Glucose-Capillary: 143 mg/dL — ABNORMAL HIGH (ref 70–99)

## 2023-12-30 MED ORDER — INSULIN ASPART 100 UNIT/ML IJ SOLN
0.0000 [IU] | Freq: Three times a day (TID) | INTRAMUSCULAR | Status: DC
  Administered 2023-12-31: 2 [IU] via SUBCUTANEOUS
  Administered 2023-12-31 – 2024-01-01 (×2): 1 [IU] via SUBCUTANEOUS
  Filled 2023-12-30 (×3): qty 1

## 2023-12-30 MED ORDER — INSULIN ASPART 100 UNIT/ML IJ SOLN: 0.0000 [IU] | Freq: Every day | INTRAMUSCULAR | Status: DC

## 2023-12-30 MED ORDER — ERTAPENEM IV (FOR PTA / DISCHARGE USE ONLY): 1.0000 g | INTRAVENOUS | 0 refills | Status: DC

## 2023-12-30 NOTE — TOC Progression Note (Signed)
 Transition of Care [facility]) - Progression Note    Patient Details  Name: Manuel [name] MRN: [id number] Date of Birth: 03/07/1946  Transition of Care [facility]) CM/SW Contact  Corean ONEIDA Haddock, RN Phone Number: 12/30/2023, 10:13 AM  Clinical Narrative:     [facility] pending for [facility]    Barriers to Discharge: Continued Medical Work up               Expected Discharge Plan and Services       Living arrangements for the past 2 months: Single Family Home Expected Discharge Date: 12/27/23                                     Social Drivers of Health (SDOH) Interventions SDOH Screenings   Food Insecurity: No Food Insecurity (12/05/2023)  Housing: Low Risk  (12/05/2023)  Transportation Needs: No Transportation Needs (12/05/2023)  Utilities: Not At Risk (12/05/2023)  Depression (PHQ2-9): High Risk (09/11/2023)  Financial Resource Strain: Low Risk  (07/11/2023)  Social Connections: Moderately Isolated (12/05/2023)  Tobacco Use: Low Risk  (12/23/2023)    Readmission Risk Interventions    12/05/2023    3:09 PM 07/09/2023    8:30 PM 02/18/2023   11:58 AM  Readmission Risk Prevention Plan  Transportation Screening Complete Complete   PCP or Specialist Appt within 3-5 Days   Complete  HRI or Home Care Consult   Complete  Social Work Consult for Recovery Care Planning/Counseling   Complete  Palliative Care Screening   Not Applicable  Medication Review Oceanographer) Complete Complete Complete  PCP or Specialist appointment within 3-5 days of discharge Complete    HRI or Home Care Consult  Complete   SW Recovery Care/Counseling Consult Complete Complete   Palliative Care Screening Not Applicable Not Applicable   Skilled Nursing Facility Not Applicable Not Applicable

## 2023-12-30 NOTE — Progress Notes (Signed)
 PHARMACY CONSULT NOTE FOR:  OUTPATIENT  PARENTERAL ANTIBIOTIC THERAPY (OPAT)  Indication: Serratia bacteremia and infected vascular graft Regimen: Ertapenem  1gm IV q24h End date: 01/22/2024 (end date extended per ID)  Labs - Once weekly:  CBC/D and CMP Fax weekly lab results  promptly to [phone] Please leave PICC in place until doctor has seen patient or been notified Call [phone] with nay questions or critical values  IV antibiotic discharge orders are pended. To discharging provider:  please sign these orders via discharge navigator,  Select New Orders & click on the button choice - Manage This Unsigned Work.     Thank you for allowing pharmacy to be a part of this patient's care.  Reem Fleury, PharmD, BCPS, BCIDP Work Cell: [phone] 12/30/2023 10:57 AM

## 2023-12-30 NOTE — Plan of Care (Signed)
 Problem: Education: Goal: Knowledge of General Education information will improve Description: Including pain rating scale, medication(s)/side effects and non-pharmacologic comfort measures Outcome: Progressing   Problem: Health Behavior/Discharge Planning: Goal: Ability to manage health-related needs will improve Outcome: Progressing   Problem: Clinical Measurements: Goal: Ability to maintain clinical measurements within normal limits will improve Outcome: Progressing Goal: Will remain free from infection Outcome: Progressing Goal: Diagnostic test results will improve Outcome: Progressing Goal: Respiratory complications will improve Outcome: Progressing Goal: Cardiovascular complication will be avoided Outcome: Progressing   Problem: Activity: Goal: Risk for activity intolerance will decrease Outcome: Progressing   Problem: Nutrition: Goal: Adequate nutrition will be maintained Outcome: Progressing   Problem: Coping: Goal: Level of anxiety will decrease Outcome: Progressing   Problem: Elimination: Goal: Will not experience complications related to bowel motility Outcome: Progressing Goal: Will not experience complications related to urinary retention Outcome: Progressing   Problem: Pain Managment: Goal: General experience of comfort will improve and/or be controlled Outcome: Progressing   Problem: Safety: Goal: Ability to remain free from injury will improve Outcome: Progressing   Problem: Skin Integrity: Goal: Risk for impaired skin integrity will decrease Outcome: Progressing   Problem: Education: Goal: Ability to describe self-care measures that may prevent or decrease complications (Diabetes Survival Skills Education) will improve Outcome: Progressing Goal: Individualized Educational Video(s) Outcome: Progressing   Problem: Coping: Goal: Ability to adjust to condition or change in health will improve Outcome: Progressing   Problem: Fluid  Volume: Goal: Ability to maintain a balanced intake and output will improve Outcome: Progressing   Problem: Health Behavior/Discharge Planning: Goal: Ability to identify and utilize available resources and services will improve Outcome: Progressing Goal: Ability to manage health-related needs will improve Outcome: Progressing   Problem: Metabolic: Goal: Ability to maintain appropriate glucose levels will improve Outcome: Progressing   Problem: Nutritional: Goal: Maintenance of adequate nutrition will improve Outcome: Progressing Goal: Progress toward achieving an optimal weight will improve Outcome: Progressing   Problem: Skin Integrity: Goal: Risk for impaired skin integrity will decrease Outcome: Progressing   Problem: Tissue Perfusion: Goal: Adequacy of tissue perfusion will improve Outcome: Progressing   Problem: Education: Goal: Understanding of CV disease, CV risk reduction, and recovery process will improve Outcome: Progressing Goal: Individualized Educational Video(s) Outcome: Progressing   Problem: Activity: Goal: Ability to return to baseline activity level will improve Outcome: Progressing   Problem: Cardiovascular: Goal: Ability to achieve and maintain adequate cardiovascular perfusion will improve Outcome: Progressing Goal: Vascular access site(s) Level 0-1 will be maintained Outcome: Progressing   Problem: Health Behavior/Discharge Planning: Goal: Ability to safely manage health-related needs after discharge will improve Outcome: Progressing   Problem: Education: Goal: Ability to describe self-care measures that may prevent or decrease complications (Diabetes Survival Skills Education) will improve Outcome: Progressing Goal: Individualized Educational Video(s) Outcome: Progressing   Problem: Cardiac: Goal: Ability to maintain an adequate cardiac output will improve Outcome: Progressing   Problem: Health Behavior/Discharge Planning: Goal:  Ability to identify and utilize available resources and services will improve Outcome: Progressing Goal: Ability to manage health-related needs will improve Outcome: Progressing   Problem: Fluid Volume: Goal: Ability to achieve a balanced intake and output will improve Outcome: Progressing   Problem: Metabolic: Goal: Ability to maintain appropriate glucose levels will improve Outcome: Progressing   Problem: Nutritional: Goal: Maintenance of adequate nutrition will improve Outcome: Progressing Goal: Maintenance of adequate weight for body size and type will improve Outcome: Progressing   Problem: Respiratory: Goal: Will regain and/or maintain adequate ventilation Outcome:  Progressing   Problem: Urinary Elimination: Goal: Ability to achieve and maintain adequate renal perfusion and functioning will improve Outcome: Progressing

## 2023-12-30 NOTE — Plan of Care (Signed)
 Pt continue to have significant OP from his wound vac. Canister will need to be replaced prior to end of this shift.    Problem: Education: Goal: Knowledge of General Education information will improve Description: Including pain rating scale, medication(s)/side effects and non-pharmacologic comfort measures Outcome: Progressing   Problem: Health Behavior/Discharge Planning: Goal: Ability to manage health-related needs will improve Outcome: Progressing   Problem: Clinical Measurements: Goal: Ability to maintain clinical measurements within normal limits will improve Outcome: Progressing Goal: Will remain free from infection Outcome: Progressing Goal: Diagnostic test results will improve Outcome: Progressing Goal: Respiratory complications will improve Outcome: Progressing Goal: Cardiovascular complication will be avoided Outcome: Progressing   Problem: Activity: Goal: Risk for activity intolerance will decrease Outcome: Progressing   Problem: Nutrition: Goal: Adequate nutrition will be maintained Outcome: Progressing   Problem: Coping: Goal: Level of anxiety will decrease Outcome: Progressing   Problem: Elimination: Goal: Will not experience complications related to bowel motility Outcome: Progressing Goal: Will not experience complications related to urinary retention Outcome: Progressing   Problem: Pain Managment: Goal: General experience of comfort will improve and/or be controlled Outcome: Progressing   Problem: Safety: Goal: Ability to remain free from injury will improve Outcome: Progressing   Problem: Skin Integrity: Goal: Risk for impaired skin integrity will decrease Outcome: Progressing   Problem: Education: Goal: Ability to describe self-care measures that may prevent or decrease complications (Diabetes Survival Skills Education) will improve Outcome: Progressing Goal: Individualized Educational Video(s) Outcome: Progressing   Problem:  Coping: Goal: Ability to adjust to condition or change in health will improve Outcome: Progressing   Problem: Fluid Volume: Goal: Ability to maintain a balanced intake and output will improve Outcome: Progressing   Problem: Health Behavior/Discharge Planning: Goal: Ability to identify and utilize available resources and services will improve Outcome: Progressing Goal: Ability to manage health-related needs will improve Outcome: Progressing   Problem: Metabolic: Goal: Ability to maintain appropriate glucose levels will improve Outcome: Progressing   Problem: Nutritional: Goal: Maintenance of adequate nutrition will improve Outcome: Progressing Goal: Progress toward achieving an optimal weight will improve Outcome: Progressing   Problem: Skin Integrity: Goal: Risk for impaired skin integrity will decrease Outcome: Progressing   Problem: Tissue Perfusion: Goal: Adequacy of tissue perfusion will improve Outcome: Progressing   Problem: Education: Goal: Understanding of CV disease, CV risk reduction, and recovery process will improve Outcome: Progressing Goal: Individualized Educational Video(s) Outcome: Progressing   Problem: Activity: Goal: Ability to return to baseline activity level will improve Outcome: Progressing   Problem: Cardiovascular: Goal: Ability to achieve and maintain adequate cardiovascular perfusion will improve Outcome: Progressing Goal: Vascular access site(s) Level 0-1 will be maintained Outcome: Progressing   Problem: Health Behavior/Discharge Planning: Goal: Ability to safely manage health-related needs after discharge will improve Outcome: Progressing   Problem: Education: Goal: Ability to describe self-care measures that may prevent or decrease complications (Diabetes Survival Skills Education) will improve Outcome: Progressing Goal: Individualized Educational Video(s) Outcome: Progressing   Problem: Cardiac: Goal: Ability to maintain an  adequate cardiac output will improve Outcome: Progressing   Problem: Health Behavior/Discharge Planning: Goal: Ability to identify and utilize available resources and services will improve Outcome: Progressing Goal: Ability to manage health-related needs will improve Outcome: Progressing   Problem: Fluid Volume: Goal: Ability to achieve a balanced intake and output will improve Outcome: Progressing   Problem: Metabolic: Goal: Ability to maintain appropriate glucose levels will improve Outcome: Progressing   Problem: Nutritional: Goal: Maintenance of adequate nutrition will improve Outcome: Progressing Goal:  Maintenance of adequate weight for body size and type will improve Outcome: Progressing   Problem: Respiratory: Goal: Will regain and/or maintain adequate ventilation Outcome: Progressing   Problem: Urinary Elimination: Goal: Ability to achieve and maintain adequate renal perfusion and functioning will improve Outcome: Progressing

## 2023-12-30 NOTE — Progress Notes (Signed)
 Progress Note    12/30/2023 4:21 PM 2 Days Post-Op  Subjective:   Manuel [name] is now POD #1 from:   Procedures: Procedure(s): DEBRIDEMENT, INGUINAL REGION Right. I&D right groin with wound vac change.    POD #6   PROCEDURE: Irrigation and debridement of right groin wound with VAC dressing change   POD #13 PROCEDURE: 1.   Right groin reexploration for bleeding 2.   Evacuation of right groin hematoma 3.   Suture repair of proximal bypass anastomosis in the distal external iliac artery 4.   Replacement of the sartorius flap for coverage 5.   Placement of gentamicin  and vancomycin  antibiotic impregnated beads 6.   Negative pressure dressing placement right groin   Patient is now POD #10    PROCEDURE: Irrigation and debridement of skin, soft tissue, and muscle of the right groin wound.  Placement of negative pressure dressing right groin.   Patient is resting comfortably in bed this morning in ICU eating breakfast. Patients wife is at his side this morning. No complaints overnight and vitals all remain stable.    Patient is resting comfortably in bed this morning.  Wound VAC is in place holding suction.  Drainage noted to be some serosanguineous only.  Vitals are remained stable.   Vitals:   12/30/23 0748 12/30/23 1449  BP: (!) 153/85 (!) 148/69  Pulse: 70 65  Resp: 20 16  Temp: 97.7 F (36.5 C) 98.4 F (36.9 C)  SpO2: 100% 96%   Physical Exam: Cardiac:  RRR.  Normal S1 and S2 no murmurs appreciated. Lungs: Lungs on auscultation are rhonchorous throughout.  Diminished in the bases.  No rales or wheezing noted.  Normal labored breathing on 2 L nasal cannula oxygen today Incisions: Right groin postoperative incision with wound VAC in place working well. Extremities: Bilateral lower extremities warm to touch with Doppler pulses. Abdomen: Positive bowel sounds throughout, soft, nontender nondistended. Neurologic: Alert and oriented x 3, answers all questions follows  commands appropriately    CBC    Component Value Date/Time   WBC 8.0 12/27/2023 1354   RBC 3.47 (L) 12/27/2023 1354   HGB 10.1 (L) 12/27/2023 1354   HGB 15.7 02/16/2012 2013   HCT 31.1 (L) 12/27/2023 1354   HCT 44.4 02/16/2012 2013   PLT 226 12/27/2023 1354   PLT 176 02/16/2012 2013   MCV 89.6 12/27/2023 1354   MCV 91 02/16/2012 2013   MCH 29.1 12/27/2023 1354   MCHC 32.5 12/27/2023 1354   RDW 15.5 12/27/2023 1354   RDW 13.0 02/16/2012 2013   LYMPHSABS 0.5 (L) 12/04/2023 1141   MONOABS 0.5 12/04/2023 1141   EOSABS 0.1 12/04/2023 1141   BASOSABS 0.0 12/04/2023 1141    BMET    Component Value Date/Time   NA 134 (L) 12/27/2023 1354   NA 137 07/21/2023 1246   NA 137 02/16/2012 2013   K 4.4 12/27/2023 1354   K 3.8 02/16/2012 2013   CL 101 12/27/2023 1354   CL 102 02/16/2012 2013   CO2 26 12/27/2023 1354   CO2 25 02/16/2012 2013   GLUCOSE 135 (H) 12/27/2023 1354   GLUCOSE 154 (H) 02/16/2012 2013   BUN 13 12/27/2023 1354   BUN 30 (H) 07/21/2023 1246   BUN 10 02/16/2012 2013   CREATININE 0.93 12/27/2023 1354   CREATININE 0.72 02/16/2012 2013   CALCIUM  8.4 (L) 12/27/2023 1354   CALCIUM  9.6 02/16/2012 2013   GFRNONAA >60 12/27/2023 1354   GFRNONAA >60 02/16/2012 2013  GFRAA >60 12/09/2018 0929   GFRAA >60 02/16/2012 2013    INR    Component Value Date/Time   INR 1.3 (H) 12/12/2023 0816     Intake/Output Summary (Last 24 hours) at 12/30/2023 1621 Last data filed at 12/30/2023 1500 Gross per 24 hour  Intake 420 ml  Output 950 ml  Net -530 ml     Assessment/Plan:  78 y.o. male is s/p SEE ABOVE 2 Days Post-Op  PLAN Wound care nurse changed wound VAC Yesterday.  If patient remains in the hospital wound VAC will be changed again on Thursday.   I changed the wound VAC container while examining the patient this morning.  The container was full with 300 cc of serosanguineous fluid.   I had a long detailed discussion at the bedside this afternoon with the patient  and his wife regarding placement to LTAC/rehab as opposed to going home.  Dr. Alfredia Blanch and myself agree that the patient's wife will be unable to take care of him at home due to his needs to protect him from infection at the wound site as well as wound VAC changes.  Both the patient and and his wife agree with the plan.  Moving forward we will attempt to set up some type of placement for the patient.   Okay per vascular surgery for patient to discharge to LTAC/rehab as soon as bed is available.   Patient needs to be discharged on ASA 81 mg daily, Plavix  75 mg daily, and Lipitor  80 mg daily.   Patient will follow-up with vein and vascular surgery in the clinic after going to rehab in 4 weeks.   DVT prophylaxis: Aspirin  81 mg daily, Plavix  75 mg daily and Lovenox  40 mg subcu every 24 hours.   [name] [facility] 12/30/2023 4:21 PM

## 2023-12-30 NOTE — Progress Notes (Signed)
 Pt declined/refused nightly SSI coverage of 2 units for his BG of 142. He is on same SSI for meals and HS. NT took BG assuming he was q4h CBG checks. Pt BG at midnight was 115.

## 2023-12-30 NOTE — Progress Notes (Signed)
 [facility]  - [place] [facility]   PATIENT NAME: Manuel [name]    MR#:  [id number]  DATE OF BIRTH:  July 02, 1945  SUBJECTIVE:  no family at bedside patient had another wound VAC changed on Tuesday per wound nurse.  Drainage amount decreasing VITALS:  Blood pressure (!) 153/85, pulse 70, temperature 97.7 F (36.5 C), temperature source Oral, resp. rate 20, height 5' 8 (1.727 m), weight 92.9 kg, SpO2 100%.  PHYSICAL EXAMINATION:  GENERAL:  78 y.o.-year-old patient with no acute distress. Chronically ill LUNGS: Normal breath sounds bilaterally, no wheezing CARDIOVASCULAR: S1, S2 normal. No murmur   ABDOMEN: Soft, nontender, nondistended. Bowel sounds present.  EXTREMITIES: right groin wound vac, right BKA/prosthetic NEUROLOGIC: nonfocal  patient is alert and awake    LABORATORY PANEL:  CBC Recent Labs  Lab 12/27/23 1354  WBC 8.0  HGB 10.1*  HCT 31.1*  PLT 226    Chemistries  Recent Labs  Lab 12/27/23 1354  NA 134*  K 4.4  CL 101  CO2 26  GLUCOSE 135*  BUN 13  CREATININE 0.93  CALCIUM  8.4*   Cardiac Enzymes No results for input(s): TROPONINI in the last 168 hours. RADIOLOGY:  No results found.   Assessment and Plan  78 y.o. 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 Patient fell with walker landing on top of him.    8/27.  Patient to go to the operating room for first wound VAC change. 8/28.  Patient can transfer out of the ICU.  Vascular team plans on taking back to the OR on Tuesday for wound VAC change. 8/29.  Patient and wife interested in going home after hospitalization.  Will need IV antibiotics upon discharge.   Nonsustained ventricular tachycardia 8 beats.  Patient given IV magnesium . 8/30.  Patient qualifies for nocturnal oxygen. 8/31.  Patient wants to go home rather than acute inpatient rehab. 9/1.  Will restart MiraLAX .  Hemoglobin 9.3.  PICC line placed. 9/2.  Patient told me this morning that he wanted to go to acute inpatient rehab.  Dr. [name] changed the wound VAC in the operating room and he is okay with nursing staff changing moving forward. 9/3--I assumed care of patient. C/o itching over his body. Did not participate with PT today. Per vascular ok to d/c to rehab. CIR was denied by insurance 9/4-- sitting up with the recliner chair. Had a meaningful conversation. Discussed about discharge planning to rehab patient agreeable. Discussed with wife. Patient states he is been feeling a bit sleepy. I tried to minimize and discontinue sedating meds. Patient has history of sleep apnea he is in the process of getting CPAP at home 9/5-- patient and wife declined to rehab. They are going home. Pam from infusion therapy will do education with wife. Home health arranged. Wound VAC changed by wound nurse. 9/6-- given increase wound VAC output I discussed with Dr. Elnor vascular on call. Repeated CT pelvis that shows collection of fluid around the surgical site. Dr. Elnor to evaluate. Patient complained of excruciating pain in the right thigh received two of morphine  and two of Ativan  which helped ease down. Discharge cancel  9/7-- underwent groin debridement of seroma and placement of Wound vac by Dr elnor. Recommends change of Vac on Tuesday 9/8--Wound vac changed again today by WOC. Dr MICKEY recs IV abxs till 01/22/24. Patient and wife agreeable with discharge to [facility].  9/9-- patient is drainage from the wound VAC is decreasing. He is medically best at baseline for discharge to LTAC once med available   Assessment and Plan: Rupture of femoral artery (HCC) 8/14.  Patient had percutaneous transluminal angioplasty and  stent placement of the right profunda femoris and common femoral artery extending into the distal right external iliac artery. 8/21.  Right external iliac artery to profunda femoris artery bypass with autologous ipsilateral superficial femoral artery, resection of infected right, femoral artery and removal of common femoral artery, profunda femoris artery and superficial femoral artery stents.  Harvest of right superficial femoral artery for bypass with endarterectomy of the right superficial femoral artery. 8/27.  Wound VAC changed in the operating room 9/2.  Wound VAC changed in the operating room.  Dr. [name] gave clearance to follow-up as outpatient and have nursing staff change wound VAC moving forward. 9/5 wound vac changed today by WOC RN.  9/6-- CT pelvis shows significant fluid collection around the surgical site, await vascular to evaluate.  CT 12/27/2023    Severe sepsis (HCC) --Secondary to Serratia.  Present on admission.   --Currently on Maxipime  as per ID.  -- Will need total of  6 weeks of IV antibiotics with ertapenem .  End date for IV abx 01/22/24 --PICC line placed on 9/1.    -- follow-up with Dr. Searcy.   Hemorrhagic shock (HCC) Resolved.   Chronic systolic CHF (congestive heart failure) (HCC) Last EF 20%, continue low-dose Coreg , enteresto,farxiga  and Aldactone  Pt to follow with [facility] cardiology Dr [name]   Uncontrolled type 2 diabetes mellitus with hyperglycemia, with long-term current use of insulin  (HCC) Cont present Insulin  dosing short and long acting   NSTEMI (non-ST elevated myocardial infarction) (HCC) Secondary to hemorrhagic shock.  Patient on aspirin , Plavix , Coreg  and statin.  Resumed enteresto and farxiga    PAD (peripheral artery disease) (HCC) Continue aspirin  and Plavix , history of right BKA.   AKI (acute kidney injury) (HCC) With metabolic acidosis.  Resolved.     Hyperkalemia Resolved   Sleep apnea Will need outpatient sleep study.  With  nocturnal hypoxia with overnight oximetry he qualifies for  oxygen.   History of alcohol  abuse On thiamine    Iron  deficiency anemia Last hemoglobin 10.3  Received 3 units of blood during the hospital course. --resumed po iron        Procedures: as per vascular sx Family communication :none today Consults : vascular, infectious disease CODE STATUS: DNR DVT Prophylaxis : Lovenox   Level of care: Med-Surg Status is: Inpatient continue IV antibiotics, wound VAC change and TOC working to get patient to LTAC.    TOTAL TIME TAKING CARE OF THIS PATIENT: 35 minutes.  >50% time spent on counselling and coordination of care  Note: This dictation was prepared with Dragon dictation along with smaller phrase technology. Any transcriptional errors that result from this process are unintentional.  [name] M.D    [facility]   CC: Primary care physician; [name], [name], MD

## 2023-12-31 DIAGNOSIS — I772 Rupture of artery: Secondary | ICD-10-CM | POA: Diagnosis not present

## 2023-12-31 DIAGNOSIS — A4153 Sepsis due to Serratia: Secondary | ICD-10-CM | POA: Diagnosis not present

## 2023-12-31 LAB — CBC
HCT: 32.6 % — ABNORMAL LOW (ref 39.0–52.0)
Hemoglobin: 10.6 g/dL — ABNORMAL LOW (ref 13.0–17.0)
MCH: 28.8 pg (ref 26.0–34.0)
MCHC: 32.5 g/dL (ref 30.0–36.0)
MCV: 88.6 fL (ref 80.0–100.0)
Platelets: 293 K/uL (ref 150–400)
RBC: 3.68 MIL/uL — ABNORMAL LOW (ref 4.22–5.81)
RDW: 14.8 % (ref 11.5–15.5)
WBC: 9.6 K/uL (ref 4.0–10.5)
nRBC: 0 % (ref 0.0–0.2)

## 2023-12-31 LAB — COMPREHENSIVE METABOLIC PANEL WITH GFR
ALT: 9 U/L (ref 0–44)
AST: 17 U/L (ref 15–41)
Albumin: 2.5 g/dL — ABNORMAL LOW (ref 3.5–5.0)
Alkaline Phosphatase: 90 U/L (ref 38–126)
Anion gap: 10 (ref 5–15)
BUN: 11 mg/dL (ref 8–23)
CO2: 23 mmol/L (ref 22–32)
Calcium: 8.4 mg/dL — ABNORMAL LOW (ref 8.9–10.3)
Chloride: 103 mmol/L (ref 98–111)
Creatinine, Ser: 0.71 mg/dL (ref 0.61–1.24)
GFR, Estimated: >60 mL/min (ref >60)
Glucose, Bld: 145 mg/dL — ABNORMAL HIGH (ref 70–99)
Potassium: 4.5 mmol/L (ref 3.5–5.1)
Sodium: 136 mmol/L (ref 135–145)
Total Bilirubin: 0.7 mg/dL (ref 0.0–1.2)
Total Protein: 5.7 g/dL — ABNORMAL LOW (ref 6.5–8.1)

## 2023-12-31 LAB — GLUCOSE, CAPILLARY
Glucose-Capillary: 107 mg/dL — ABNORMAL HIGH (ref 70–99)
Glucose-Capillary: 113 mg/dL — ABNORMAL HIGH (ref 70–99)
Glucose-Capillary: 157 mg/dL — ABNORMAL HIGH (ref 70–99)
Glucose-Capillary: 127 mg/dL — ABNORMAL HIGH (ref 70–99)
Glucose-Capillary: 119 mg/dL — ABNORMAL HIGH (ref 70–99)

## 2023-12-31 LAB — SEDIMENTATION RATE: Sed Rate: 25 mm/h — ABNORMAL HIGH (ref 0–20)

## 2023-12-31 LAB — C-REACTIVE PROTEIN: CRP: 2.3 mg/dL — ABNORMAL HIGH (ref <1.0)

## 2023-12-31 MED ORDER — SACUBITRIL-VALSARTAN 24-26 MG PO TABS
1.0000 | ORAL_TABLET | Freq: Two times a day (BID) | ORAL | Status: DC
  Administered 2023-12-31 – 2024-01-01 (×2): 1 via ORAL
  Filled 2023-12-31 (×2): qty 1

## 2023-12-31 MED ORDER — ESCITALOPRAM OXALATE 10 MG PO TABS
10.0000 mg | ORAL_TABLET | Freq: Every day | ORAL | Status: DC
  Administered 2024-01-01: 10 mg via ORAL
  Filled 2023-12-31: qty 1

## 2023-12-31 MED ORDER — TRAZODONE HCL 100 MG PO TABS
100.0000 mg | ORAL_TABLET | Freq: Every day | ORAL | Status: DC
  Administered 2023-12-31: 100 mg via ORAL
  Filled 2023-12-31: qty 1

## 2023-12-31 NOTE — Progress Notes (Signed)
   12/31/23 1345  Spiritual Encounters  Type of Visit Follow up  Care provided to: Pt and family (Wife is at bedside)  Conversation partners present during Programmer, systems  Referral source Nurse (RN/NT/LPN)  Reason for visit Routine spiritual support  OnCall Visit Yes  Spiritual Framework  Presenting Themes Meaning/purpose/sources of inspiration;Goals in life/care;Impactful experiences and emotions;Other (comment) (Pt says he's depressed b/c he's been here so long & he's tired of itching from the pain meds. Let the nurse know he was still itching and she brought a different kind of itch med and Pt took a nap)  Patient Stress Factors Exhausted  Family Stress Factors Exhausted  Interventions  Spiritual Care Interventions Made Established relationship of care and support;Compassionate presence;Reflective listening;Normalization of emotions;Encouragement;Other (comment) (Helped wife move his things to a different rm. Room temp wasn't working correctly.)  Intervention Outcomes  Outcomes Connection to spiritual care;Awareness around self/spiritual resourses;Connection to values and goals of care;Awareness of support

## 2023-12-31 NOTE — TOC Progression Note (Signed)
 Transition of Care [facility]) - Progression Note    Patient Details  Name: Manuel [name] MRN: [id number] Date of Birth: October 21, 1945  [facility]) CM/SW Contact  Corean ONEIDA Haddock, RN Phone Number: 12/31/2023, 12:04 PM  Clinical Narrative:      Peer to peer offered for LTACH.  Deadline 230 pm.  MD aware    Barriers to Discharge: Continued Medical Work up               Expected Discharge Plan and Services       Living arrangements for the past 2 months: Single Family Home Expected Discharge Date: 12/27/23                                     Social Drivers of Health (SDOH) Interventions SDOH Screenings   Food Insecurity: No Food Insecurity (12/05/2023)  Housing: Low Risk  (12/05/2023)  Transportation Needs: No Transportation Needs (12/05/2023)  Utilities: Not At Risk (12/05/2023)  Depression (PHQ2-9): High Risk (09/11/2023)  Financial Resource Strain: Low Risk  (07/11/2023)  Social Connections: Moderately Isolated (12/05/2023)  Tobacco Use: Low Risk  (12/23/2023)    Readmission Risk Interventions    12/05/2023    3:09 PM 07/09/2023    8:30 PM 02/18/2023   11:58 AM  Readmission Risk Prevention Plan  Transportation Screening Complete Complete   PCP or Specialist Appt within 3-5 Days   Complete  HRI or Home Care Consult   Complete  Social Work Consult for Recovery Care Planning/Counseling   Complete  Palliative Care Screening   Not Applicable  Medication Review Oceanographer) Complete Complete Complete  PCP or Specialist appointment within 3-5 days of discharge Complete    HRI or Home Care Consult  Complete   SW Recovery Care/Counseling Consult Complete Complete   Palliative Care Screening Not Applicable Not Applicable   Skilled Nursing Facility Not Applicable Not Applicable

## 2023-12-31 NOTE — TOC Progression Note (Signed)
 Transition of Care [facility]) - Progression Note    Patient Details  Name: Manuel [name] MRN: [id number] Date of Birth: 01/28/1946  Transition of Care [facility]) CM/SW Contact  Corean ONEIDA Haddock, RN Phone Number: 12/31/2023, 4:52 PM  Clinical Narrative:      DENETTE barrows denied.  Bed offers presented to patient and spouse [facility] and rehab selected.  Selected in [place], and notified Darrian at [facility].  She confirms they can provide wound vac and IV antibiotics  Call placed to [facility] [phone] spoke with rep Luke and requested auth.    Barriers to Discharge: Continued Medical Work up               Expected Discharge Plan and Services       Living arrangements for the past 2 months: Single Family Home Expected Discharge Date: 12/27/23                                     Social Drivers of Health (SDOH) Interventions SDOH Screenings   Food Insecurity: No Food Insecurity (12/05/2023)  Housing: Low Risk  (12/05/2023)  Transportation Needs: No Transportation Needs (12/05/2023)  Utilities: Not At Risk (12/05/2023)  Depression (PHQ2-9): High Risk (09/11/2023)  Financial Resource Strain: Low Risk  (07/11/2023)  Social Connections: Moderately Isolated (12/05/2023)  Tobacco Use: Low Risk  (12/23/2023)    Readmission Risk Interventions    12/05/2023    3:09 PM 07/09/2023    8:30 PM 02/18/2023   11:58 AM  Readmission Risk Prevention Plan  Transportation Screening Complete Complete   PCP or Specialist Appt within 3-5 Days   Complete  HRI or Home Care Consult   Complete  Social Work Consult for Recovery Care Planning/Counseling   Complete  Palliative Care Screening   Not Applicable  Medication Review Oceanographer) Complete Complete Complete  PCP or Specialist appointment within 3-5 days of discharge Complete    HRI or Home Care Consult  Complete   SW Recovery Care/Counseling Consult Complete Complete   Palliative Care Screening Not Applicable Not Applicable   Skilled  Nursing Facility Not Applicable Not Applicable

## 2023-12-31 NOTE — Progress Notes (Addendum)
 OT Cancellation Note  Patient Details Name: Manuel [name] MRN: [id number] DOB: 11-Mar-1946   Cancelled Treatment:    Reason Eval/Treat Not Completed: Other (comment). Pt received with nursing staff, currently being transported to another room. Spouse reports pt did not sleep much last night. OT will re-attempt tomorrow as able.  Burech Mcfarland L. Nikolina Simerson, OTR/L  12/31/23, 2:49 PM

## 2023-12-31 NOTE — Progress Notes (Signed)
 Physical Therapy Treatment Patient Details Name: Manuel [name] MRN: [id number] DOB: [birth year] Today's Date: 12/31/2023   History of Present Illness Patient is an 78 year old male presenting after fall while getting out of bed to wheelchair.  Found to have right femoral a rupture, vascular consulted and performed repair. Complicated by Hemorrhagic shock requiring ICU and pressors. PMH: CHF, CAD, PAD, R BKA, HTN, gout    PT Comments  Pt is making slow progress demonstrating needing less assist with transfers (CGA) and gait (CGA with RW and prosthesis donned) and progressing with gait distance (20').   Pt continues to fatigue quickly and is at a fall risk due to poor balance reactions.  Continued PT will assist pt towards greater dynamic standing balance, LE strengthening, and activity tolerance to increase safety and independence and decrease burden of care with functional mobility.    If plan is discharge home, recommend the following: A little help with walking and/or transfers;A little help with bathing/dressing/bathroom;Assist for transportation;Help with stairs or ramp for entrance;Assistance with cooking/housework   Can travel by private vehicle        Equipment Recommendations  None recommended by PT    Recommendations for Other Services Rehab consult     Precautions / Restrictions Precautions Precautions: Fall Recall of Precautions/Restrictions: Intact Precaution/Restrictions Comments: wound vac right groin and RLE prosthetic Restrictions Weight Bearing Restrictions Per Provider Order: No     Mobility  Bed Mobility               General bed mobility comments: NT pt up in recliner    Transfers Overall transfer level: Needs assistance Equipment used: Rolling walker (2 wheels) (L prosthesis) Transfers: Sit to/from Stand, Bed to chair/wheelchair/BSC Sit to Stand: Contact guard assist Stand pivot transfers: Contact guard assist Step pivot transfers: Contact guard  assist            Ambulation/Gait Ambulation/Gait assistance: Contact guard assist Gait Distance (Feet): 20 Feet Assistive device: Rolling walker (2 wheels) Gait Pattern/deviations: Step-to pattern, Decreased step length - left, Decreased stance time - right Gait velocity: decreased     General Gait Details: Step-to sequencing education provided with RW with pt generally steady with no overt LOB but fatigued quickly   Stairs             Wheelchair Mobility     Tilt Bed    Modified Rankin (Stroke Patients Only)       Balance Overall balance assessment: Needs assistance Sitting-balance support: No upper extremity supported, Feet supported Sitting balance-Leahy Scale: Good     Standing balance support: Bilateral upper extremity supported, During functional activity, Reliant on assistive device for balance Standing balance-Leahy Scale: Fair Standing balance comment: RW and CGA from author during transfer                            Communication Communication Communication: No apparent difficulties Factors Affecting Communication: Hearing impaired  Cognition Arousal: Alert Behavior During Therapy: WFL for tasks assessed/performed   PT - Cognitive impairments: No apparent impairments                       PT - Cognition Comments: redirection needed, pt verbose Following commands: Intact Following commands impaired: Follows one step commands with increased time    Cueing Cueing Techniques: Verbal cues, Tactile cues  Exercises      General Comments General comments (skin integrity, edema, etc.): Pt on  1L O2 SPO2 91%.  Ambulated on RA and returned to sit in recliner, SPO2 84%, no SOB noted but pt reports fatigue.  Put pt back on supplemental O2.      Pertinent Vitals/Pain Pain Assessment Pain Assessment: No/denies pain    Home Living                          Prior Function            PT Goals (current goals can now  be found in the care plan section) Acute Rehab PT Goals Patient Stated Goal: rehab then home PT Goal Formulation: With patient Time For Goal Achievement: 12/29/23 Potential to Achieve Goals: Good Progress towards PT goals: Progressing toward goals    Frequency    Min 2X/week      PT Plan      Co-evaluation              AM-PAC PT 6 Clicks Mobility   Outcome Measure  Help needed turning from your back to your side while in a flat bed without using bedrails?: A Little Help needed moving from lying on your back to sitting on the side of a flat bed without using bedrails?: A Lot Help needed moving to and from a bed to a chair (including a wheelchair)?: A Little Help needed standing up from a chair using your arms (e.g., wheelchair or bedside chair)?: A Little Help needed to walk in hospital room?: A Little Help needed climbing 3-5 steps with a railing? : A Lot 6 Click Score: 16    End of Session Equipment Utilized During Treatment: Gait belt Activity Tolerance: Patient tolerated treatment well Patient left: in chair;with call bell/phone within reach;Other (comment) Nurse Communication: Mobility status PT Visit Diagnosis: Difficulty in walking, not elsewhere classified (R26.2);Muscle weakness (generalized) (M62.81)     Time: 8945-8877 PT Time Calculation (min) (ACUTE ONLY): 28 min  Charges:    $Therapeutic Activity: 23-37 mins PT General Charges $$ ACUTE PT VISIT: 1 Visit                     Harland Irving, PTA  12/31/23, 11:38 AM

## 2023-12-31 NOTE — Plan of Care (Signed)
 Problem: Education: Goal: Knowledge of General Education information will improve Description: Including pain rating scale, medication(s)/side effects and non-pharmacologic comfort measures Outcome: Progressing   Problem: Health Behavior/Discharge Planning: Goal: Ability to manage health-related needs will improve Outcome: Progressing   Problem: Clinical Measurements: Goal: Ability to maintain clinical measurements within normal limits will improve Outcome: Progressing Goal: Will remain free from infection Outcome: Progressing Goal: Diagnostic test results will improve Outcome: Progressing Goal: Respiratory complications will improve Outcome: Progressing Goal: Cardiovascular complication will be avoided Outcome: Progressing   Problem: Activity: Goal: Risk for activity intolerance will decrease Outcome: Progressing   Problem: Nutrition: Goal: Adequate nutrition will be maintained Outcome: Progressing   Problem: Coping: Goal: Level of anxiety will decrease Outcome: Progressing   Problem: Elimination: Goal: Will not experience complications related to bowel motility Outcome: Progressing Goal: Will not experience complications related to urinary retention Outcome: Progressing   Problem: Pain Managment: Goal: General experience of comfort will improve and/or be controlled Outcome: Progressing   Problem: Safety: Goal: Ability to remain free from injury will improve Outcome: Progressing   Problem: Skin Integrity: Goal: Risk for impaired skin integrity will decrease Outcome: Progressing   Problem: Education: Goal: Ability to describe self-care measures that may prevent or decrease complications (Diabetes Survival Skills Education) will improve Outcome: Progressing Goal: Individualized Educational Video(s) Outcome: Progressing   Problem: Coping: Goal: Ability to adjust to condition or change in health will improve Outcome: Progressing   Problem: Fluid  Volume: Goal: Ability to maintain a balanced intake and output will improve Outcome: Progressing   Problem: Health Behavior/Discharge Planning: Goal: Ability to identify and utilize available resources and services will improve Outcome: Progressing Goal: Ability to manage health-related needs will improve Outcome: Progressing   Problem: Metabolic: Goal: Ability to maintain appropriate glucose levels will improve Outcome: Progressing   Problem: Nutritional: Goal: Maintenance of adequate nutrition will improve Outcome: Progressing Goal: Progress toward achieving an optimal weight will improve Outcome: Progressing   Problem: Skin Integrity: Goal: Risk for impaired skin integrity will decrease Outcome: Progressing   Problem: Tissue Perfusion: Goal: Adequacy of tissue perfusion will improve Outcome: Progressing   Problem: Education: Goal: Understanding of CV disease, CV risk reduction, and recovery process will improve Outcome: Progressing Goal: Individualized Educational Video(s) Outcome: Progressing   Problem: Activity: Goal: Ability to return to baseline activity level will improve Outcome: Progressing   Problem: Cardiovascular: Goal: Ability to achieve and maintain adequate cardiovascular perfusion will improve Outcome: Progressing Goal: Vascular access site(s) Level 0-1 will be maintained Outcome: Progressing   Problem: Health Behavior/Discharge Planning: Goal: Ability to safely manage health-related needs after discharge will improve Outcome: Progressing   Problem: Education: Goal: Ability to describe self-care measures that may prevent or decrease complications (Diabetes Survival Skills Education) will improve Outcome: Progressing Goal: Individualized Educational Video(s) Outcome: Progressing   Problem: Cardiac: Goal: Ability to maintain an adequate cardiac output will improve Outcome: Progressing   Problem: Health Behavior/Discharge Planning: Goal:  Ability to identify and utilize available resources and services will improve Outcome: Progressing Goal: Ability to manage health-related needs will improve Outcome: Progressing   Problem: Fluid Volume: Goal: Ability to achieve a balanced intake and output will improve Outcome: Progressing   Problem: Metabolic: Goal: Ability to maintain appropriate glucose levels will improve Outcome: Progressing   Problem: Nutritional: Goal: Maintenance of adequate nutrition will improve Outcome: Progressing Goal: Maintenance of adequate weight for body size and type will improve Outcome: Progressing   Problem: Respiratory: Goal: Will regain and/or maintain adequate ventilation Outcome:  Progressing   Problem: Urinary Elimination: Goal: Ability to achieve and maintain adequate renal perfusion and functioning will improve Outcome: Progressing

## 2023-12-31 NOTE — Progress Notes (Signed)
 Progress Note    12/31/2023 8:48 AM 3 Days Post-Op  Subjective:  Manuel [name] is now POD #3 from:   Procedures: Procedure(s): DEBRIDEMENT, INGUINAL REGION Right. I&D right groin with wound vac change.    POD #6   PROCEDURE: Irrigation and debridement of right groin wound with VAC dressing change   POD #13 PROCEDURE: 1.   Right groin reexploration for bleeding 2.   Evacuation of right groin hematoma 3.   Suture repair of proximal bypass anastomosis in the distal external iliac artery 4.   Replacement of the sartorius flap for coverage 5.   Placement of gentamicin  and vancomycin  antibiotic impregnated beads 6.   Negative pressure dressing placement right groin   Patient is now POD #13    PROCEDURE: Irrigation and debridement of skin, soft tissue, and muscle of the right groin wound.  Placement of negative pressure dressing right groin.    Patient is resting comfortably in the bedside chair this morning.  Wound VAC is in place holding suction.  I was called earlier for a possible leak. I directed nursing to notify wound care as they have been changing the wound vac. Drainage noted to be some serosanguineous only.  Patient endorses he was uncomfortable all night due to Vitals are remained stable.     Vitals:   12/30/23 1920 12/31/23 0320  BP: (!) 156/90 (!) 170/87  Pulse: 70 75  Resp: 20 20  Temp: 98.6 F (37 C) 97.8 F (36.6 C)  SpO2: 99% 100%   Physical Exam: Cardiac:  RRR.  Normal S1 and S2 no murmurs appreciated. Lungs: Lungs on auscultation are rhonchorous throughout.  Diminished in the bases.  No rales or wheezing noted.  Normal labored breathing on 2 L nasal cannula oxygen today Incisions: Right groin postoperative incision with wound VAC in place working well. Extremities: Bilateral lower extremities warm to touch with Doppler pulses. Abdomen: Positive bowel sounds throughout, soft, nontender nondistended. Neurologic: Alert and oriented x 3, answers all  questions follows commands appropriately  CBC    Component Value Date/Time   WBC 9.6 12/31/2023 0311   RBC 3.68 (L) 12/31/2023 0311   HGB 10.6 (L) 12/31/2023 0311   HGB 15.7 02/16/2012 2013   HCT 32.6 (L) 12/31/2023 0311   HCT 44.4 02/16/2012 2013   PLT 293 12/31/2023 0311   PLT 176 02/16/2012 2013   MCV 88.6 12/31/2023 0311   MCV 91 02/16/2012 2013   MCH 28.8 12/31/2023 0311   MCHC 32.5 12/31/2023 0311   RDW 14.8 12/31/2023 0311   RDW 13.0 02/16/2012 2013   LYMPHSABS 0.5 (L) 12/04/2023 1141   MONOABS 0.5 12/04/2023 1141   EOSABS 0.1 12/04/2023 1141   BASOSABS 0.0 12/04/2023 1141    BMET    Component Value Date/Time   NA 134 (L) 12/27/2023 1354   NA 137 07/21/2023 1246   NA 137 02/16/2012 2013   K 4.4 12/27/2023 1354   K 3.8 02/16/2012 2013   CL 101 12/27/2023 1354   CL 102 02/16/2012 2013   CO2 26 12/27/2023 1354   CO2 25 02/16/2012 2013   GLUCOSE 135 (H) 12/27/2023 1354   GLUCOSE 154 (H) 02/16/2012 2013   BUN 13 12/27/2023 1354   BUN 30 (H) 07/21/2023 1246   BUN 10 02/16/2012 2013   CREATININE 0.93 12/27/2023 1354   CREATININE 0.72 02/16/2012 2013   CALCIUM  8.4 (L) 12/27/2023 1354   CALCIUM  9.6 02/16/2012 2013   GFRNONAA >60 12/27/2023 1354   GFRNONAA >60 02/16/2012  2013   GFRAA >60 12/09/2018 0929   GFRAA >60 02/16/2012 2013    INR    Component Value Date/Time   INR 1.3 (H) 12/12/2023 0816     Intake/Output Summary (Last 24 hours) at 12/31/2023 0848 Last data filed at 12/31/2023 0644 Gross per 24 hour  Intake 350 ml  Output 875 ml  Net -525 ml     Assessment/Plan:  78 y.o. male is s/p SEE ABOVE  3 Days Post-Op  PLAN Wound care nurse changed wound VAC Yesterday.  If patient remains in the hospital wound VAC will be changed again on Thursday.   I changed the wound VAC container while examining the patient this morning.  The container had 100 cc's of serosanguineous fluid.   I had a long detailed discussion at the bedside this afternoon with the  patient and his wife regarding placement to LTAC/rehab as opposed to going home.  Dr. Alfredia Blanch and myself agree that the patient's wife will be unable to take care of him at home due to his needs to protect him from infection at the wound site as well as wound VAC changes.  Both the patient and and his wife agree with the plan.  Moving forward we will attempt to set up some type of placement for the patient.   Okay per vascular surgery for patient to discharge to LTAC/rehab as soon as bed is available.   Patient needs to be discharged on ASA 81 mg daily, Plavix  75 mg daily, and Lipitor  80 mg daily.   Patient will follow-up with vein and vascular surgery in the clinic after going to rehab in 4 weeks.   DVT prophylaxis: Aspirin  81 mg daily, Plavix  75 mg daily and Lovenox  40 mg subcu every 24 hours.   [name] [facility] 12/31/2023 8:48 AM

## 2023-12-31 NOTE — Progress Notes (Signed)
 PROGRESS NOTE  Manuel [name]    DOB: 1945-04-24, 78 y.o.  FMW:[id number]    Code Status: Limited: Do not attempt resuscitation (DNR) -DNR-LIMITED -Do Not Intubate/DNI    DOA: 12/04/2023   LOS: 27   Brief hospital course  Manuel [name] is a 78 y.o. 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 They presented with Right femoral artery Rupture with large femoral hematoma with active extravasation on CTA. Vascular surgery was consulted and repaired 8/14. Has been having ongoing management per vascular surgery including wound vac changes throughout admission. Now being managed by [facility] nurse.  Additionally- found to be in severe sepsis from bacteremia, POA. Treated with IV antibiotics until 10/2 per ID consult recommendations.   12/31/23 -stable. Medically ready for dc once SNF is available.   Assessment & Plan  Principal Problem:   Rupture of femoral artery (HCC) Active Problems:   Severe sepsis (HCC)   Hemorrhagic shock (HCC)   Chronic systolic CHF (congestive heart failure) (HCC)   Uncontrolled type 2 diabetes mellitus with hyperglycemia, with long-term current use of insulin  (HCC)   NSTEMI (non-ST elevated myocardial infarction) (HCC)   Hyponatremia   PAD (peripheral artery disease) (HCC)   AKI (acute kidney injury) (HCC)   Ischemic cardiomyopathy   Coronary artery disease involving native coronary artery of native heart   Serratia infection   Vascular graft infection (HCC)   Iron  deficiency anemia   History of alcohol  abuse   Sleep apnea   Hyperkalemia   Gram-negative bacteremia   Diarrhea   NSVT (nonsustained ventricular tachycardia) (HCC)  Right femoral artery Rupture with large femoral hematoma with active extravasation 8/14.  Patient had  percutaneous transluminal angioplasty and stent placement of the right profunda femoris and common femoral artery extending into the distal right external iliac artery. 8/21.  Right external iliac artery to profunda femoris artery bypass with autologous ipsilateral superficial femoral artery, resection of infected right, femoral artery and removal of common femoral artery, profunda femoris artery and superficial femoral artery stents.  Harvest of right superficial femoral artery for bypass with endarterectomy of the right superficial femoral artery. 8/27.  Wound VAC changed in the operating room 9/2.  Wound VAC changed in the operating room.  Dr. [name] gave clearance to follow-up as outpatient and have nursing staff change wound VAC moving forward. 9/5 wound vac changed today by WOC RN.  9/6-- CT pelvis shows significant fluid collection around the surgical site - continue ASA, plavix , lipitor  80mg  daily  - 4 week outpatient follow up with vascular surgery    Severe sepsis (HCC) --Secondary to Serratia.  Present on admission.   --Currently on Maxipime  as per ID.  -- Will need total of  6 weeks of IV antibiotics with ertapenem .  End date for IV abx 01/22/24 --PICC line placed on 9/1.    -- follow-up with Dr. Searcy.   Hemorrhagic shock (HCC) Resolved.   Chronic systolic CHF (congestive heart failure) (HCC) Last EF 20%, continue low-dose Coreg , enteresto,farxiga  and Aldactone  Pt to follow with [facility] cardiology Dr [name]   Uncontrolled type 2 diabetes mellitus with hyperglycemia, with long-term current use of insulin  (HCC) Cont present Insulin  dosing short and long acting   NSTEMI (non-ST elevated myocardial infarction) (  HCC) Secondary to hemorrhagic shock.  Patient on aspirin , Plavix , Coreg  and statin.  Resumed enteresto and farxiga    PAD (peripheral artery disease) (HCC) Continue aspirin  and Plavix , history of right BKA. Continue PT/OT   AKI (acute kidney injury) (HCC) With  metabolic acidosis.  Resolved.     Hyperkalemia Resolved   Sleep apnea Will need outpatient sleep study.  With nocturnal hypoxia with overnight oximetry he qualifies for  oxygen.   History of alcohol  abuse On thiamine    Iron  deficiency anemia Last hemoglobin 10.3  Received 3 units of blood during the hospital course. --resumed po iron   Body mass index is 31.14 kg/m.  VTE ppx: enoxaparin  (LOVENOX ) injection 40 mg Start: 12/13/23 1000 SCDs Start: 12/04/23 2104  Diet:     Diet   Diet heart healthy/carb modified Room service appropriate? Yes; Fluid consistency: Thin   Consultants: Vascular surgery CCM ID  Subjective 12/31/23    Pt reports feeling alright today but tired since he had significant itching all night and unable to sleep well. Denies rash. Has been going on about a week roughly and last night was the worst.    Objective  Blood pressure (!) 170/87, pulse 75, temperature 97.8 F (36.6 C), temperature source Oral, resp. rate 20, height 5' 8 (1.727 m), weight 92.9 kg, SpO2 100%.  Intake/Output Summary (Last 24 hours) at 12/31/2023 0658 Last data filed at 12/31/2023 0644 Gross per 24 hour  Intake 410 ml  Output 1350 ml  Net -940 ml   Filed Weights   12/11/23 1845 12/23/23 0652 12/24/23 0418  Weight: 93.4 kg 93.4 kg 92.9 kg    Physical Exam:  General: awake, alert, NAD HEENT: atraumatic, clear conjunctiva, anicteric sclera, MMM, hearing grossly normal Respiratory: normal respiratory effort. Cardiovascular: quick capillary refill, normal S1/S2, RRR, no JVD, murmurs Gastrointestinal: soft, NT, ND Nervous: A&O x3. no gross focal neurologic deficits, normal speech Extremities: R BKA. Erythema of R groin surrounding wound vac which is adherent. Swollen scrotum  Psychiatry: normal mood, congruent affect  Labs   I have personally reviewed the following labs and imaging studies CBC    Component Value Date/Time   WBC 9.6 12/31/2023 0311   RBC 3.68 (L)  12/31/2023 0311   HGB 10.6 (L) 12/31/2023 0311   HGB 15.7 02/16/2012 2013   HCT 32.6 (L) 12/31/2023 0311   HCT 44.4 02/16/2012 2013   PLT 293 12/31/2023 0311   PLT 176 02/16/2012 2013   MCV 88.6 12/31/2023 0311   MCV 91 02/16/2012 2013   MCH 28.8 12/31/2023 0311   MCHC 32.5 12/31/2023 0311   RDW 14.8 12/31/2023 0311   RDW 13.0 02/16/2012 2013   LYMPHSABS 0.5 (L) 12/04/2023 1141   MONOABS 0.5 12/04/2023 1141   EOSABS 0.1 12/04/2023 1141   BASOSABS 0.0 12/04/2023 1141      Latest Ref Rng & Units 12/27/2023    1:54 PM 12/22/2023    5:19 AM 12/20/2023    5:41 AM  BMP  Glucose 70 - 99 mg/dL 864  864  79   BUN 8 - 23 mg/dL 13  23  31    Creatinine 0.61 - 1.24 mg/dL 9.06  9.22  9.21   Sodium 135 - 145 mmol/L 134  134  135   Potassium 3.5 - 5.1 mmol/L 4.4  4.6  4.5   Chloride 98 - 111 mmol/L 101  106  108   CO2 22 - 32 mmol/L 26  24  22    Calcium  8.9 - 10.3 mg/dL  8.4  8.5  8.5    No results found.  Disposition Plan & Communication  Patient status: Inpatient  Admitted From: Home Planned disposition location: Skilled nursing facility Anticipated discharge date: 9/11 pending SNF approval   Family Communication: wife at bedside    Author: Marien LITTIE Piety, DO [facility] 12/31/2023, 6:58 AM   Available by Epic secure chat 7AM-7PM. If 7PM-7AM, please contact night-coverage.  [facility] contact information found on [email or web address].

## 2024-01-01 DIAGNOSIS — I772 Rupture of artery: Secondary | ICD-10-CM | POA: Diagnosis not present

## 2024-01-01 LAB — GLUCOSE, CAPILLARY
Glucose-Capillary: 74 mg/dL (ref 70–99)
Glucose-Capillary: 121 mg/dL — ABNORMAL HIGH (ref 70–99)
Glucose-Capillary: 124 mg/dL — ABNORMAL HIGH (ref 70–99)

## 2024-01-01 MED ORDER — LORATADINE 10 MG PO TABS: 10.0000 mg | ORAL_TABLET | Freq: Every day | ORAL | Status: AC | PRN

## 2024-01-01 MED ORDER — FLUTICASONE PROPIONATE 50 MCG/ACT NA SUSP: 1.0000 | Freq: Two times a day (BID) | NASAL | Status: AC | PRN

## 2024-01-01 MED ORDER — HYDROCODONE-ACETAMINOPHEN 5-325 MG PO TABS: 1.0000 | ORAL_TABLET | Freq: Four times a day (QID) | ORAL | 0 refills | Status: AC | PRN

## 2024-01-01 MED ORDER — NITROGLYCERIN 0.4 MG SL SUBL
0.4000 mg | SUBLINGUAL_TABLET | SUBLINGUAL | Status: AC | PRN
Start: 2024-01-01 — End: 2024-12-31

## 2024-01-01 MED ORDER — POLYETHYLENE GLYCOL 3350 17 G PO PACK: 17.0000 g | PACK | Freq: Every day | ORAL | Status: AC

## 2024-01-01 MED ORDER — TRAZODONE HCL 100 MG PO TABS: 100.0000 mg | ORAL_TABLET | Freq: Every day | ORAL | Status: DC

## 2024-01-01 MED ORDER — INSULIN ASPART 100 UNIT/ML IJ SOLN: 0.0000 [IU] | Freq: Three times a day (TID) | INTRAMUSCULAR | Status: DC

## 2024-01-01 MED ORDER — PANTOPRAZOLE SODIUM 40 MG PO TBEC: 40.0000 mg | DELAYED_RELEASE_TABLET | Freq: Every day | ORAL | Status: DC

## 2024-01-01 MED ORDER — CARVEDILOL 3.125 MG PO TABS: 3.1250 mg | ORAL_TABLET | Freq: Two times a day (BID) | ORAL | Status: DC

## 2024-01-01 MED ORDER — ESCITALOPRAM OXALATE 10 MG PO TABS: 10.0000 mg | ORAL_TABLET | Freq: Every day | ORAL | Status: DC

## 2024-01-01 NOTE — Progress Notes (Signed)
 Per wound nurse, prior to patients discharge, take wound vac dressing off right groin and dress with wet kerlex, abd pads and take prior to transport. Receiving facility has wound vac and will dress upon arrival.

## 2024-01-01 NOTE — Consult Note (Addendum)
 WOC Nurse wound follow up Wound type: Surgical. Infected arteria. Pressure Injury POA: NA Measurement: 17 x 9 x 3 cm. Tunneling 5 cm at 1 o'clock, and 5 cm in the center of the wound, going to the right. Draining a clear fluid. Wound bed: 85% red, granulated tissue. 15% yellow. Drainage (amount, consistency, odor) Copious amount. 150 ml on canister at 1000 of serosanguinous. Periwound: redness, moist in crease at 3 and 9 o'clock. Edema, induration tissue at the bottom of the wound bed . Dressing procedure/placement/frequency: Removed old NPWT dressing Cleansed wound with normal saline Used skin prep barrier pad #[id number]. Filled wound with 3 pieces of black foam  Applied a 1/2 ring in crease at 3 and another 1/2 ring at 9 o'clock.  Sealed NPWT dressing at HG Patient received IV pain medication per bedside nurse prior to dressing change Patient tolerated procedure well.   WOC nurse will continue to provide NPWT dressing changed due to the complexity of the dressing change.   Note: Plans to dc soon.    WOC team will follow MON. Please reconsult if further assistance is needed. Thank-you,  Lela Holm RN, CNS, [employer], MSN.  (Phone [phone])

## 2024-01-01 NOTE — Progress Notes (Signed)
 Occupational Therapy Treatment Patient Details Name: Manuel [name] MRN: [id number] DOB: Jun 17, 1945 Today's Date: 01/01/2024   History of present illness Patient is an 78 year old male presenting after fall while getting out of bed to wheelchair.  Found to have right femoral a rupture, vascular consulted and performed repair. Complicated by Hemorrhagic shock requiring ICU and pressors. PMH: CHF, CAD, PAD, R BKA, HTN, gout   OT comments  Pt seen for OT treatment this date. Pt making progress towards goals, fatigues quickly and requires additional time for recovery breaks during session. Completes bed mobility with R prothesis donned with CGA, is able to complete functional STS transfers with RW and CGA, step pivot to recliner with cues for sequencing and grooming tasks recliner level with setup. Discharge recommendation updated, patient will benefit from continued inpatient follow up therapy, <3 hours/day       If plan is discharge home, recommend the following:  A little help with walking and/or transfers;A little help with bathing/dressing/bathroom;A lot of help with bathing/dressing/bathroom;Assistance with cooking/housework;Help with stairs or ramp for entrance   Equipment Recommendations  Other (comment)       Precautions / Restrictions Precautions Precautions: Fall Recall of Precautions/Restrictions: Intact Precaution/Restrictions Comments: wound vac right groin and RLE prosthetic Restrictions Weight Bearing Restrictions Per Provider Order: No       Mobility Bed Mobility Overal bed mobility: Needs Assistance Bed Mobility: Supine to Sit     Supine to sit: Contact guard     General bed mobility comments: CGA for safety, cues for hand placement    Transfers Overall transfer level: Needs assistance Equipment used: Rolling walker (2 wheels) (L prosthesis) Transfers: Sit to/from Stand, Bed to chair/wheelchair/BSC Sit to Stand: Contact guard assist     Step pivot  transfers: Contact guard assist     General transfer comment: transferred to recliner     Balance Overall balance assessment: Needs assistance Sitting-balance support: No upper extremity supported, Feet supported Sitting balance-Leahy Scale: Good     Standing balance support: Bilateral upper extremity supported, During functional activity, Reliant on assistive device for balance Standing balance-Leahy Scale: Fair Standing balance comment: RW and CGA from author during transfer                           ADL either performed or assessed with clinical judgement   ADL Overall ADL's : Needs assistance/impaired     Grooming: Wash/dry hands;Wash/dry face;Sitting Grooming Details (indicate cue type and reason): recliner level                 Toilet Transfer: Contact guard assist;Stand-pivot;BSC/3in1;Rolling walker (2 wheels) Toilet Transfer Details (indicate cue type and reason): simulated to recliner with RW         Functional mobility during ADLs: Contact guard assist;Rolling walker (2 wheels) General ADL Comments: Pt making progress towards goals, fatigues quickly     Communication Communication Communication: No apparent difficulties Factors Affecting Communication: Hearing impaired   Cognition Arousal: Alert Behavior During Therapy: WFL for tasks assessed/performed Cognition: Cognition impaired     Awareness: Intellectual awareness impaired   Attention impairment (select first level of impairment): Sustained attention Executive functioning impairment (select all impairments): Problem solving OT - Cognition Comments: verbose at times                 Following commands: Intact Following commands impaired: Follows one step commands with increased time      Cueing   Cueing  Techniques: Verbal cues, Tactile cues        General Comments R prosthesis donned prior to start of session, wound vac intact thoughout    Pertinent Vitals/ Pain        Pain Assessment Pain Assessment: 0-10 Pain Score: 2  Pain Location: R groin Pain Descriptors / Indicators: Sore Pain Intervention(s): Limited activity within patient's tolerance, Monitored during session, Repositioned   Frequency  Min 3X/week        Progress Toward Goals  OT Goals(current goals can now be found in the care plan section)  Progress towards OT goals: Progressing toward goals  Acute Rehab OT Goals OT Goal Formulation: With patient Time For Goal Achievement: 01/12/24 Potential to Achieve Goals: Good ADL Goals Pt Will Perform Grooming: with modified independence;sitting Pt Will Perform Lower Body Dressing: with modified independence;sitting/lateral leans Pt Will Transfer to Toilet: bedside commode;with supervision Pt Will Perform Toileting - Clothing Manipulation and hygiene: with modified independence  Plan         AM-PAC OT 6 Clicks Daily Activity     Outcome Measure   Help from another person eating meals?: None Help from another person taking care of personal grooming?: None Help from another person toileting, which includes using toliet, bedpan, or urinal?: A Lot Help from another person bathing (including washing, rinsing, drying)?: A Lot Help from another person to put on and taking off regular upper body clothing?: A Little Help from another person to put on and taking off regular lower body clothing?: A Lot 6 Click Score: 17    End of Session Equipment Utilized During Treatment: Gait belt;Rolling walker (2 wheels)  OT Visit Diagnosis: Unsteadiness on feet (R26.81);Repeated falls (R29.6)   Activity Tolerance Patient tolerated treatment well   Patient Left in chair;with call bell/phone within reach;with chair alarm set   Nurse Communication Mobility status        Time: 8877-8851 OT Time Calculation (min): 26 min  Charges: OT General Charges $OT Visit: 1 Visit OT Treatments $Self Care/Home Management : 8-22 mins $Therapeutic Activity:  8-22 mins  Amias Hutchinson L. Markee Matera, OTR/L  01/01/24, 4:38 PM

## 2024-01-01 NOTE — Discharge Summary (Signed)
 Physician Discharge Summary  Patient: Manuel [name] FMW:[id number] DOB: [birth year]   Code Status: Limited: Do not attempt resuscitation (DNR) -DNR-LIMITED -Do Not Intubate/DNI  Admit date: 12/04/2023 Discharge date: 01/01/2024 Disposition: Skilled nursing facility, PT, OT, nurse aid, RN, and wound care PCP: Onita Devere POUR, MD  Recommendations for Outpatient Follow-up:  Follow up with PCP within 1-2 weeks Regarding general hospital follow up and preventative care Recommend CBC, BMP Follow up with vascular surgery for wound dressing changes  Discharge Diagnoses:  Principal Problem:   Rupture of femoral artery (HCC) Active Problems:   Severe sepsis (HCC)   Hemorrhagic shock (HCC)   Chronic systolic CHF (congestive heart failure) (HCC)   Uncontrolled type 2 diabetes mellitus with hyperglycemia, with long-term current use of insulin  (HCC)   NSTEMI (non-ST elevated myocardial infarction) (HCC)   Hyponatremia   PAD (peripheral artery disease) (HCC)   AKI (acute kidney injury) (HCC)   Ischemic cardiomyopathy   Coronary artery disease involving native coronary artery of native heart   Serratia infection   Vascular graft infection (HCC)   Iron  deficiency anemia   History of alcohol  abuse   Sleep apnea   Hyperkalemia   Gram-negative bacteremia   Diarrhea   NSVT (nonsustained ventricular tachycardia) (HCC)  Brief Hospital Course Summary: Manuel [name] is a 78 y.o. male with a PMH significant for chronic combined systolic and diastolic CHF 25-30%, PAD/carotid stenosis, CAD s/p pci RCA 2011 c/b NSTEMI 06/2023 s/p stent x 3 to LAD, extensive PAD s/p PTA , hx of Right BKA , HTN, DmII, Gout, Hx of ETOH dependence, HLD, DJD of the spine, OSA.  They presented after a mechanical fall with Right femoral artery Rupture with large femoral hematoma with active extravasation on CTA. Vascular surgery was consulted and repaired 8/14. Has been having ongoing management per vascular surgery including  wound vac changes throughout admission. Now being managed by [facility] nurse. He will be discharged with the wound vac to follow up outpatient with vascular surgery. He is hemodynamically stable following total of 3 units pRBCs early on admission.   Additionally- found to be in severe sepsis from serratia bacteremia, POA. Treated with IV antibiotics until 10/2 per ID consult recommendations. He will dc with PICC line to complete this treatment. Last day of antibiotics is 01/22/2024.    PT/OT evaluated and recommended SNF at dc due to decreased mobility from baseline.   All other chronic conditions were treated with home medications.    Discharge Condition: Good, improved Recommended discharge diet: Regular healthy diet  Consultations: ID Vascular surgery   Procedures/Studies: R groin wound clean out, wound vac.   Discharge Instructions     [facility] pharmacist to adjust dose for Vancomycin , Aminoglycosides and other anti-infective therapies as requested by physician.   Complete by: As directed    [facility] to provide Cath Flo 2mg    Complete by: As directed    Administer for PICC line occlusion and as ordered by physician for other access device issues.   Anaphylaxis Kit: Provided to treat any anaphylactic reaction to the medication being provided to the patient if First Dose or when requested by physician   Complete by: As directed    Epinephrine  1mg /ml vial / amp: Administer 0.3mg  (0.66ml) subcutaneously once for moderate to severe anaphylaxis, nurse to call physician and pharmacy when reaction occurs and call 911 if needed for immediate care   Diphenhydramine  50mg /ml IV vial: Administer 25-50mg  IV/IM PRN for first dose reaction, rash, itching,  mild reaction, nurse to call physician and pharmacy when reaction occurs   Sodium Chloride  0.9% NS 500ml IV: Administer if needed for hypovolemic blood pressure drop or as ordered by physician after call to physician with  anaphylactic reaction   Change dressing on IV access line weekly and PRN   Complete by: As directed    Flush IV access with Sodium Chloride  0.9% and Heparin  10 units/ml or 100 units/ml   Complete by: As directed    Home infusion instructions - [facility]   Complete by: As directed    Instructions: Flush IV access with Sodium Chloride  0.9% and Heparin  10units/ml or 100units/ml   Change dressing on IV access line: Weekly and PRN   Instructions Cath Flo 2mg : Administer for PICC Line occlusion and as ordered by physician for other access device   [facility] pharmacist to adjust dose for: Vancomycin , Aminoglycosides and other anti-infective therapies as requested by physician   Method of administration may be changed at the discretion of home infusion pharmacist based upon assessment of the patient and/or caregiver's ability to self-administer the medication ordered   Complete by: As directed       Allergies as of 01/01/2024   No Known Allergies      Medication List     STOP taking these medications    furosemide  40 MG tablet Commonly known as: LASIX    insulin  aspart protamine - aspart (70-30) 100 UNIT/ML injection Commonly known as: NOVOLOG  MIX 70/30   senna-docusate 8.6-50 MG tablet Commonly known as: Senokot-S       TAKE these medications    artificial tears ophthalmic solution Place 1 drop into both eyes as needed for dry eyes.   aspirin  EC 81 MG tablet Take 1 tablet (81 mg total) by mouth daily.   atorvastatin  80 MG tablet Commonly known as: LIPITOR  Take 1 tablet (80 mg total) by mouth at bedtime.   carvedilol  3.125 MG tablet Commonly known as: COREG  Take 1 tablet (3.125 mg total) by mouth 2 (two) times daily with a meal. What changed:  medication strength how much to take   clopidogrel  75 MG tablet Commonly known as: PLAVIX  Take 1 tablet (75 mg total) by mouth daily.   dapagliflozin  propanediol 10 MG Tabs tablet Commonly known as:  FARXIGA  Take 1 tablet (10 mg total) by mouth daily.   Ensure Max Protein Liqd Take 330 mLs (11 oz total) by mouth 2 (two) times daily. What changed: when to take this   ertapenem  IVPB Commonly known as: INVANZ  Inject 1 g into the vein daily for 23 days. Indication:  Serratia bacteremia and infected vascular graft First Dose: Yes Last Day of Therapy:  01/22/2024 Labs - Once weekly:  CBC/D and CMP Fax weekly lab results  promptly to [phone] Method of administration: Mini-Bag Plus / Gravity Method of administration may be changed at the discretion of home infusion pharmacist based upon assessment of the patient and/or caregiver's ability to self-administer the medication ordered. Please leave PIC in place until doctor has seen patient or been notified Call [phone] with nay questions or critical values   escitalopram  10 MG tablet Commonly known as: LEXAPRO  Take 1 tablet (10 mg total) by mouth daily. Start taking on: January 02, 2024 What changed:  medication strength how much to take   ferrous sulfate  325 (65 FE) MG EC tablet Take 325 mg by mouth daily with breakfast.   fluticasone  50 MCG/ACT nasal spray Commonly known as: FLONASE  Place 1 spray  into both nostrils 2 (two) times daily as needed for allergies or rhinitis.   folic acid  1 MG tablet Commonly known as: FOLVITE  Take 1 tablet (1 mg total) by mouth daily.   gabapentin  100 MG capsule Commonly known as: NEURONTIN  1 po at bedtime.   HYDROcodone -acetaminophen  5-325 MG tablet Commonly known as: NORCO/VICODIN Take 1 tablet by mouth every 6 (six) hours as needed for up to 5 days for moderate pain (pain score 4-6) or severe pain (pain score 7-10).   insulin  aspart 100 UNIT/ML injection Commonly known as: novoLOG  Inject 0-9 Units into the skin 3 (three) times daily with meals.   loratadine  10 MG tablet Commonly known as: CLARITIN  Take 1 tablet (10 mg total) by mouth daily as needed for allergies.    methocarbamol  500 MG tablet Commonly known as: ROBAXIN  Take 500 mg by mouth every 8 (eight) hours as needed for muscle spasms.   nitroGLYCERIN  0.4 MG SL tablet Commonly known as: Nitrostat  Place 1 tablet (0.4 mg total) under the tongue every 5 (five) minutes as needed for chest pain. Max: 3 tablets per day   ondansetron  4 MG tablet Commonly known as: ZOFRAN  Take 1 tablet (4 mg total) by mouth every 6 (six) hours as needed for nausea.   pantoprazole  40 MG tablet Commonly known as: PROTONIX  Take 1 tablet (40 mg total) by mouth daily.   polyethylene glycol 17 g packet Commonly known as: MIRALAX  / GLYCOLAX  Take 17 g by mouth daily. What changed:  when to take this reasons to take this   sacubitril -valsartan  24-26 MG Commonly known as: ENTRESTO  Take 1 tablet by mouth every 12 (twelve) hours.   spironolactone  25 MG tablet Commonly known as: ALDACTONE  Take 1 tablet (25 mg total) by mouth daily.   thiamine  100 MG tablet Commonly known as: Vitamin B-1 Take 1 tablet (100 mg total) by mouth daily.   traZODone  100 MG tablet Commonly known as: DESYREL  Take 1 tablet (100 mg total) by mouth at bedtime.               Durable Medical Equipment  (From admission, onward)           Start     Ordered   12/26/23 0916  For home use only DME oxygen  Once       Question Answer Comment  Length of Need Lifetime   Mode or (Route) Nasal cannula   Liters per Minute 2   Frequency Continuous (stationary and portable oxygen unit needed)   Oxygen conserving device Yes   Oxygen delivery system Gas      12/26/23 0915              Discharge Care Instructions  (From admission, onward)           Start     Ordered   12/26/23 0000  Change dressing on IV access line weekly and PRN  (Home infusion instructions - [facility] )        12/26/23 0936            Contact information for follow-up providers     [facility]. Go on 01/07/2024.   Specialty: Cardiology Why: Hospital Follow-Please call us  to schedule a follow-up appointment in 1-2 weeks. (When you find out your Vascular follow-up appt we can schedule right before or after that). Please reach out to let us  know. Please bring all medications to follow-up appt [facility], [address], Second  Floor Free Valet Parking at the door, As needed...    Go at 2:30pm. Contact information: [address] [place]  [address] [phone]        [name], Devere POUR, MD. Go on 01/02/2024.   Specialty: Family Medicine Why: Go at 1:00PM. Contact information: [address] [place] [address] [phone]         Schnier, Cordella MATSU, MD. Go on 01/06/2024.   Specialties: Vascular Surgery, Cardiology, Radiology, Vascular Surgery Why: ruptured femoral artery post wound vac Go at 3:15pm you will see Orvin Daring NP. Contact information: [address] [place] [address] [phone]         [name] Bodily, MD. Go on 01/06/2024.   Specialty: Infectious Diseases Why: at 11:30 am Contact information: [address] [place] [address] [phone]         [name] Evalene PARAS, MD. Schedule an appointment as soon as possible for a visit in 2 week(s).   Specialty: Cardiology Why: Cardiomyopathy Contact information: [address] [place] [address] [phone]              Contact information for after-discharge care     Destination     [facility] .   Service: Skilled Nursing Contact information: [address] [place]  [address] [phone]                    Subjective   Pt reports feeling well today. Slept well overnight. Denies itching. Pain is well controlled. No SOB.   All questions and concerns were addressed at time of discharge.  Objective  Blood pressure (!) 145/77,  pulse 69, temperature 97.7 F (36.5 C), resp. rate 20, height 5' 8 (1.727 m), weight 92.9 kg, SpO2 96%.   General: Pt is alert, awake, not in acute distress Cardiovascular: RRR, S1/S2 +, no rubs, no gallops Respiratory: CTA bilaterally, no wheezing, no rhonchi Abdominal: Soft, NT, ND, bowel sounds + Extremities: no edema, no cyanosis. R stump wrapped. R groin wound vac in place. Surrounding erythema. No drainage  The results of significant diagnostics from this hospitalization (including imaging, microbiology, ancillary and laboratory) are listed below for reference.   Imaging studies: CT ANGIO PELVIS W OR WO CONTRAST Result Date: 12/27/2023 CLINICAL DATA:  Excess wound drainage post repair. Evaluate for new rupture or leak. Complex vascular history with major vascular reconstruction for infection and bleeding in the right groin. History of sartorius flap placement. EXAM: CT ANGIOGRAPHY PELVIS TECHNIQUE: Non-contrast CT of the pelvis was initially obtained. Multidetector CT imaging through the pelvis was performed using the standard protocol during bolus administration of intravenous contrast. Multiplanar reconstructed images and MIPs were obtained and reviewed to evaluate the vascular anatomy. RADIATION DOSE REDUCTION: This exam was performed according to the departmental dose-optimization program which includes automated exposure control, adjustment of the mA and/or kV according to patient size and/or use of iterative reconstruction technique. CONTRAST:  OMNIPAQUE  IOHEXOL  350 MG/ML SOLN COMPARISON:  CT 12/04/2023 FINDINGS: VASCULAR Aorta: Extensive atherosclerotic disease in the distal abdominal aorta. Distal abdominal aorta at the bifurcation measures roughly 3.1 cm and stable. Inflow: Bilateral common iliac artery stents at the aortic bifurcation. Right common iliac artery stent is patent and appears to terminate in the distal common iliac artery. Severe disease at the origin of the right  internal iliac artery. Right external iliac artery is patent. Surgical bypass graft between the right external  iliac artery and right profunda femoral artery that is patent. No evidence for contrast extravasation in this area. Proximal right SFA has been resected. Visualized right profunda femoral arteries are patent with atherosclerotic disease. Left iliac stents involving the left common and left external iliac artery are patent. Stents terminate in the distal left external iliac artery. No significant flow in the proximal left internal iliac artery. Left common femoral artery is patent. Metallic structures anterior to left common femoral artery compatible with a closure device. Metallic structure just anterior to the proximal left SFA could also represent a vascular closure device. Mild-to-moderate stenosis in the proximal left SFA. Left profunda femoral arteries are heavily diseased but patent. Veins: Distal IVC is patent. Bilateral common and external iliac veins are patent. Limited evaluation of the proximal femoral veins. Review of the MIP images confirms the above findings. NON-VASCULAR Urinary Tract: No acute abnormality in the visualized kidneys. Normal appearance of the urinary bladder. Bowel: Extensive diverticulosis in the sigmoid colon without acute bowel inflammation. No significant bowel dilatation in the pelvis. Lymphatic: No significant lymph node enlargement in the pelvis Reproductive: Prostate is unremarkable. Other: No free fluid in the pelvis. Musculoskeletal: Diffuse subcutaneous edema throughout the pelvis. Open wound in the anterior right pelvis. Bilobed fluid collection in the anterior pelvis containing a small amount of gas. The lateral aspect of the collection is low-density and the more medial collection is slightly hyperdense. This bilobed collection measures 10.1 cm in transverse dimension on image 44/16. The medial aspect of the collection extends along the anteromedial aspect of the  left thigh and roughly measures 13 cm in the craniocaudal dimension. Small amount of fluid extends into the right retroperitoneal space on image 34/16. Vascular bypass from the right external to the right profunda femoral arteries is surrounded by the more medial fluid collection. Extensive degenerative facet disease in lower lumbar spine. Disc space narrowing at L5-S1. Review of the MIP images confirms the above findings. IMPRESSION: 1. Extensive postsurgical changes. The surgical bypass from the right external iliac artery to right profunda femoral arteries is patent. No evidence for contrast extravasation or active bleeding. However, the surgical bypass is surrounded by the fluid collection in the medial right thigh that contains a small amount of gas. The fluid collections are nonspecific. Abscess or infected seroma/hematoma cannot be excluded. 2. Extensive atherosclerotic disease involving the aorta and iliac arteries. Bilateral iliac artery stents are patent. 3. 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 4. Extensive subcutaneous edema in the pelvis and proximal thighs. Electronically Signed   By: Juliene Balder M.D.   On: 12/27/2023 10:56   US  EKG SITE RITE Result Date: 12/22/2023 If Site Rite image not attached, placement could not be confirmed due to current cardiac rhythm.  DG Abd 1 View Result Date: 12/12/2023 CLINICAL DATA:  OG tube placement. EXAM: ABDOMEN - 1 VIEW COMPARISON:  None Available. FINDINGS: OG tube tip is in the gastric antrum. Gas is evident within the nondistended stomach. Scattered small bowel and colonic gas identified in the visualized upper abdomen, nonobstructive pattern. IMPRESSION: OG tube tip is in the distal stomach. Electronically Signed   By: Camellia Candle M.D.   On: 12/12/2023 07:47   DG Chest Port 1 View Result Date: 12/12/2023 CLINICAL DATA:  Ventilator dependence. EXAM: PORTABLE CHEST 1 VIEW COMPARISON:  12/04/2023 FINDINGS: Endotracheal tube tip is  6.2 cm above the base of the carina. The NG tube passes into the stomach although the distal tip position is  not included on the film. The cardio pericardial silhouette is enlarged. Retrocardiac left base collapse/consolidation is new in the interval, obscuring the left hemidiaphragm. Right lung clear. Telemetry leads overlie the chest. Degenerative changes are noted in both shoulders. IMPRESSION: 1. Interval development of retrocardiac left base collapse/consolidation. 2. Endotracheal tube tip is 6.2 cm above the base of the carina. Electronically Signed   By: Camellia Candle M.D.   On: 12/12/2023 07:47   ECHOCARDIOGRAM COMPLETE Result Date: 12/06/2023    ECHOCARDIOGRAM REPORT   Patient Name:   MAXAMILIAN AMADON Date of Exam: 12/05/2023 Medical Rec #:  [id number]        Height:       68.0 in Accession #:    [id number]       Weight:       180.0 lb Date of Birth:  12-Apr-1946        BSA:          1.954 m Patient Age:    78 years         BP:           81/51 mmHg Patient Gender: M                HR:           80 bpm. Exam Location:  [facility] Procedure: 2D Echo, Cardiac Doppler and Color Doppler (Both Spectral and Color            Flow Doppler were utilized during procedure). Indications:     Elevated Troponin  History:         Patient has prior history of Echocardiogram examinations, most                  recent 07/11/2023. CHF, Previous Myocardial Infarction and CAD,                  PAD; Risk Factors:Diabetes and Dyslipidemia.  Sonographer:     Thea Norlander RCS Referring Phys:  [id number] BRITTON L R[place] Diagnosing Phys: Redell Cave MD IMPRESSIONS  1. Left ventricular ejection fraction, by estimation, is 20 to 25%. The left ventricle has severely decreased function. The left ventricle demonstrates global hypokinesis. Left ventricular diastolic parameters are consistent with Grade I diastolic dysfunction (impaired relaxation).  2. Right ventricular systolic function is severely reduced. The right ventricular  size is normal. There is normal pulmonary artery systolic pressure.  3. Left atrial size was mildly dilated.  4. The mitral valve is normal in structure. Mild mitral valve regurgitation.  5. The aortic valve is tricuspid. Aortic valve regurgitation is not visualized. Aortic valve sclerosis/calcification is present, without any evidence of aortic stenosis.  6. Aortic dilatation noted. There is mild dilatation of the ascending aorta, measuring 39 mm.  7. The inferior vena cava is dilated in size with <50% respiratory variability, suggesting right atrial pressure of 15 mmHg. FINDINGS  Left Ventricle: Left ventricular ejection fraction, by estimation, is 20 to 25%. The left ventricle has severely decreased function. The left ventricle demonstrates global hypokinesis. The left ventricular internal cavity size was normal in size. There is no left ventricular hypertrophy. Left ventricular diastolic parameters are consistent with Grade I diastolic dysfunction (impaired relaxation). Right Ventricle: The right ventricular size is normal. No increase in right ventricular wall thickness. Right ventricular systolic function is severely reduced. There is normal pulmonary artery systolic pressure. The tricuspid regurgitant velocity is 2.14 m/s, and with an assumed right atrial pressure of 15 mmHg, the estimated right  ventricular systolic pressure is 33.3 mmHg. Left Atrium: Left atrial size was mildly dilated. Right Atrium: Right atrial size was normal in size. Pericardium: There is no evidence of pericardial effusion. Mitral Valve: The mitral valve is normal in structure. Mild mitral valve regurgitation. Tricuspid Valve: The tricuspid valve is normal in structure. Tricuspid valve regurgitation is not demonstrated. Aortic Valve: The aortic valve is tricuspid. Aortic valve regurgitation is not visualized. Aortic valve sclerosis/calcification is present, without any evidence of aortic stenosis. Aortic valve peak gradient measures 2.8  mmHg. Pulmonic Valve: The pulmonic valve was not well visualized. Pulmonic valve regurgitation is not visualized. Aorta: Aortic dilatation noted and the aortic root is normal in size and structure. There is mild dilatation of the ascending aorta, measuring 39 mm. Venous: The inferior vena cava is dilated in size with less than 50% respiratory variability, suggesting right atrial pressure of 15 mmHg. IAS/Shunts: No atrial level shunt detected by color flow Doppler.  LEFT VENTRICLE PLAX 2D LVIDd:         5.10 cm   Diastology LVIDs:         4.60 cm   LV e' medial:    3.32 cm/s LV PW:         0.90 cm   LV E/e' medial:  17.0 LV IVS:        1.00 cm   LV e' lateral:   7.14 cm/s LVOT diam:     2.10 cm   LV E/e' lateral: 7.9 LV SV:         36 LV SV Index:   19 LVOT Area:     3.46 cm  RIGHT VENTRICLE            IVC RV S prime:     4.22 cm/s  IVC diam: 2.40 cm LEFT ATRIUM             Index        RIGHT ATRIUM           Index LA diam:        4.50 cm 2.30 cm/m   RA Area:     17.30 cm LA Vol (A2C):   40.3 ml 20.62 ml/m  RA Volume:   46.50 ml  23.79 ml/m LA Vol (A4C):   66.4 ml 33.98 ml/m LA Biplane Vol: 55.0 ml 28.14 ml/m  AORTIC VALVE AV Area (Vmax): 2.68 cm AV Vmax:        83.40 cm/s AV Peak Grad:   2.8 mmHg LVOT Vmax:      64.50 cm/s LVOT Vmean:     40.400 cm/s LVOT VTI:       0.105 m  AORTA Ao Root diam: 3.50 cm Ao Asc diam:  3.90 cm MITRAL VALVE               TRICUSPID VALVE MV Area (PHT): 4.68 cm    TR Peak grad:   18.3 mmHg MV Decel Time: 162 msec    TR Vmax:        214.00 cm/s MR Peak grad: 25.4 mmHg MR Vmax:      252.00 cm/s  SHUNTS MV E velocity: 56.60 cm/s  Systemic VTI:  0.10 m MV A velocity: 74.90 cm/s  Systemic Diam: 2.10 cm MV E/A ratio:  0.76 Redell Cave MD Electronically signed by Redell Cave MD Signature Date/Time: 12/06/2023/3:24:18 PM    Final    US  RENAL Result Date: 12/05/2023 CLINICAL DATA:  Acute kidney injury. EXAM: RENAL / URINARY TRACT ULTRASOUND  COMPLETE COMPARISON:  None  Available. FINDINGS: Right Kidney: Renal measurements: 12.3 cm x 5.6 cm x 6.6 cm = volume: 237.0 mL. Echogenicity within normal limits. No mass or hydronephrosis visualized. Left Kidney: Renal measurements: 11.7 cm x 5.8 cm x 5.4 cm = volume: 192.8 mL. Echogenicity within normal limits. No mass or hydronephrosis visualized. Bladder: Appears normal for degree of bladder distention. Other: None. IMPRESSION: Unremarkable renal ultrasound. Electronically Signed   By: Suzen Dials M.D.   On: 12/05/2023 20:17   PERIPHERAL VASCULAR CATHETERIZATION Result Date: 12/04/2023 See surgical note for result.  CT CHEST ABDOMEN PELVIS W CONTRAST Result Date: 12/04/2023 CLINICAL DATA:  Sepsis.  Fall. EXAM: CT CHEST, ABDOMEN, AND PELVIS WITH CONTRAST TECHNIQUE: Multidetector CT imaging of the chest, abdomen and pelvis was performed following the standard protocol during bolus administration of intravenous contrast. RADIATION DOSE REDUCTION: This exam was performed according to the departmental dose-optimization program which includes automated exposure control, adjustment of the mA and/or kV according to patient size and/or use of iterative reconstruction technique. CONTRAST:  OMNIPAQUE  IOHEXOL  300 MG/ML  SOLN COMPARISON:  CT abdomen and pelvis 06/16/2021. FINDINGS: CT CHEST FINDINGS Cardiovascular: The heart is mildly enlarged. There is no pericardial effusion. There are atherosclerotic calcifications of the aorta and coronary arteries. Aorta is normal in size. Mediastinum/Nodes: No enlarged mediastinal, hilar, or axillary lymph nodes. Thyroid gland, trachea, and esophagus demonstrate no significant findings. Lungs/Pleura: There are mild patchy peripheral ground-glass opacities in the anterior right upper lobe with some focal pleural thickening. There are calcified granulomas bilaterally. The lungs are otherwise clear. There is no pleural effusion or pneumothorax. Musculoskeletal: There is some expansile cystic  changes in the right glenoid and non expansile cystic changes in the left lung is cleared these are incompletely imaged. No other focal osseous lesions are identified. There is no acute fracture or dislocation identified. Multilevel degenerative changes affect the spine. There are healed right fourth through seventh rib fractures. CT ABDOMEN PELVIS FINDINGS Hepatobiliary: There is diffuse fatty infiltration of the liver. There is gallbladder wall edema. No radiopaque calculi are seen. There is no biliary ductal dilatation. Pancreas: Unremarkable. No pancreatic ductal dilatation or surrounding inflammatory changes. Spleen: Spleen is mildly enlarged. Adrenals/Urinary Tract: There is a 3 mm calculus in the distal right ureter. There is no right-sided hydronephrosis. Otherwise, the bilateral kidneys, adrenal glands and bladder are within normal limits. Stomach/Bowel: Stomach is within normal limits. Appendix appears normal. No evidence of bowel wall thickening, distention, or inflammatory changes. There is diffuse colonic diverticulosis, most significant in the sigmoid colon and descending colon. Vascular/Lymphatic: Infrarenal abdominal aortic aneurysm measures 3.2 cm similar to prior. There is severe atherosclerotic calcifications of the aorta. IVC is normal in size. There is a vascular stent in the left external iliac artery. There is hematoma surrounding the right common femoral artery measuring 6.6 x 4.9 by 7.0 cm. There is active extravasation of a large amount of contrast into the central hematoma with source at the anterior margin of the right common femoral artery. There is some scarring surrounding the left common femoral artery. No enlarged lymph nodes are identified. Reproductive: Prostate gland is mildly enlarged. Other: There is a small fat containing umbilical hernia. There is no ascites. There is no retroperitoneal hematoma. Musculoskeletal: There is severe degenerative changes throughout the spine.  Chronic L1 compression fracture is unchanged. IMPRESSION: 1. Large hematoma surrounding the right common femoral artery with active arterial extravasation of contrast into the hematoma. 2. 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 Recommend follow-up every 3 years. 3. Mild patchy peripheral ground-glass opacities in the anterior right upper lobe with some focal pleural thickening. Findings are nonspecific and may be infectious/inflammatory. 4. Gallbladder wall edema. No radiopaque calculi are seen. Correlate clinically for cholecystitis. 5. Nonobstructing 3 mm calculus in the distal right ureter. 6. Colonic diverticulosis. 7. Fatty infiltration of the liver. 8. Mild splenomegaly. Aortic Atherosclerosis (ICD10-I70.0). These results were called by telephone at the time of interpretation on 12/04/2023 at 5:49 pm to provider [facility] , who verbally acknowledged these results. Electronically Signed   By: Greig Pique M.D.   On: 12/04/2023 17:49   CT Head Wo Contrast Result Date: 12/04/2023 CLINICAL DATA:  Status post fall. EXAM: CT HEAD WITHOUT CONTRAST TECHNIQUE: Contiguous axial images were obtained from the base of the skull through the vertex without intravenous contrast. RADIATION DOSE REDUCTION: This exam was performed according to the departmental dose-optimization program which includes automated exposure control, adjustment of the mA and/or kV according to patient size and/or use of iterative reconstruction technique. COMPARISON:  April 09, 2021 FINDINGS: Brain: There is generalized cerebral atrophy with widening of the extra-axial spaces and ventricular dilatation. There are areas of decreased attenuation within the white matter tracts of the supratentorial brain, consistent with microvascular disease changes. Vascular: Marked severity bilateral cavernous carotid artery calcification is noted. Skull: Normal. Negative for fracture or focal lesion. Sinuses/Orbits: No acute finding. Other: None.  IMPRESSION: 1. Generalized cerebral atrophy and microvascular disease changes of the supratentorial brain. 2. No acute intracranial abnormality. Electronically Signed   By: Suzen Dials M.D.   On: 12/04/2023 17:44   DG Shoulder Right Result Date: 12/04/2023 CLINICAL DATA:  Status post fall. EXAM: RIGHT SHOULDER - 2+ VIEW COMPARISON:  None Available. FINDINGS: There is no evidence of an acute fracture or dislocation. A chronic appearing deformity is seen along the inferior medial aspect of the right humeral head and neck. Multiple chronic right-sided rib fractures are noted. Marked severity degenerative changes are seen involving the right glenohumeral joint. Extensive chronic and degenerative changes are also present involving the right glenohumeral joint, in the form of joint space narrowing and extensive humeral and glenoid subchondral cyst formation. Soft tissues are unremarkable. IMPRESSION: Chronic and degenerative changes, as described above, without an acute osseous abnormality. Electronically Signed   By: Suzen Dials M.D.   On: 12/04/2023 17:41   CT Cervical Spine Wo Contrast Result Date: 12/04/2023 CLINICAL DATA:  Status post fall. EXAM: CT CERVICAL SPINE WITHOUT CONTRAST TECHNIQUE: Multidetector CT imaging of the cervical spine was performed without intravenous contrast. Multiplanar CT image reconstructions were also generated. RADIATION DOSE REDUCTION: This exam was performed according to the departmental dose-optimization program which includes automated exposure control, adjustment of the mA and/or kV according to patient size and/or use of iterative reconstruction technique. COMPARISON:  None Available. FINDINGS: Alignment: There is straightening of the normal cervical spine lordosis. Approximately 2 mm anterolisthesis of the C4 vertebral body is noted on C5. Skull base and vertebrae: No acute fracture. No primary bone lesion or focal pathologic process. Chronic and degenerative  changes are seen involving the body and tip of the dens, as well as the adjacent portion of the anterior arch of C1. Soft tissues and spinal canal: No prevertebral fluid or swelling. No visible canal hematoma. Disc levels: Marked severity endplate sclerosis, anterior osteophyte formation and posterior bony spurring are seen at the levels of C4-C5, C5-C6, C6-C7 and C7-T1. Posterior bony spurring is also present at the  level of C3-C4. There is marked severity narrowing of the anterior atlantoaxial articulation. Marked severity intervertebral disc space narrowing is seen at C4-C5, C5-C6, C6-C7 and C7-T1. Bilateral marked severity multilevel facet joint hypertrophy is noted. Upper chest: Negative. Other: None. IMPRESSION: 1. No acute fracture or traumatic subluxation. 2. Marked severity multilevel degenerative changes, as described above. Electronically Signed   By: Suzen Dials M.D.   On: 12/04/2023 17:36   DG Chest 2 View Result Date: 12/04/2023 CLINICAL DATA:  Weakness. EXAM: DG CHEST 2V COMPARISON:  07/09/2023. FINDINGS: Stable cardiomegaly. Aortic atherosclerosis. No focal consolidation, pleural effusion, or pneumothorax. Remote healed right-sided rib fractures. Advanced degenerative changes of the bilateral glenohumeral joints. No acute osseous abnormality identified. IMPRESSION: 1. No acute cardiopulmonary findings. 2. Stable cardiomegaly. 3.  Aortic Atherosclerosis (ICD10-I70.0). Electronically Signed   By: Harrietta Sherry M.D.   On: 12/04/2023 12:34    Labs: Basic Metabolic Panel: Recent Labs  Lab 12/27/23 1354 12/31/23 1327  NA 134* 136  K 4.4 4.5  CL 101 103  CO2 26 23  GLUCOSE 135* 145*  BUN 13 11  CREATININE 0.93 0.71  CALCIUM  8.4* 8.4*   CBC: Recent Labs  Lab 12/27/23 1354 12/31/23 0311  WBC 8.0 9.6  HGB 10.1* 10.6*  HCT 31.1* 32.6*  MCV 89.6 88.6  PLT 226 293   Microbiology: Results for orders placed or performed during the hospital encounter of 12/04/23  Resp panel  by RT-PCR (RSV, Flu A&B, Covid) Anterior Nasal Swab     Status: None   Collection Time: 12/04/23  3:48 PM   Specimen: Anterior Nasal Swab  Result Value Ref Range Status   SARS Coronavirus 2 by RT PCR NEGATIVE NEGATIVE Final    Comment: (NOTE) SARS-CoV-2 target nucleic acids are NOT DETECTED.  The SARS-CoV-2 RNA is generally detectable in upper respiratory specimens during the acute phase of infection. The lowest concentration of SARS-CoV-2 viral copies this assay can detect is 138 copies/mL. A negative result does not preclude SARS-Cov-2 infection and should not be used as the sole basis for treatment or other patient management decisions. A negative result may occur with  improper specimen collection/handling, submission of specimen other than nasopharyngeal swab, presence of viral mutation(s) within the areas targeted by this assay, and inadequate number of viral copies(<138 copies/mL). A negative result must be combined with clinical observations, patient history, and epidemiological information. The expected result is Negative.  Fact Sheet for Patients:  BloggerCourse.com  Fact Sheet for Healthcare Providers:  SeriousBroker.it  This test is no t yet approved or cleared by the [place]  FDA and  has been authorized for detection and/or diagnosis of SARS-CoV-2 by FDA under an Emergency Use Authorization (EUA). This EUA will remain  in effect (meaning this test can be used) for the duration of the COVID-19 declaration under Section 564(b)(1) of the Act, 21 U.S.C.section 360bbb-3(b)(1), unless the authorization is terminated  or revoked sooner.       Influenza A by PCR NEGATIVE NEGATIVE Final   Influenza B by PCR NEGATIVE NEGATIVE Final    Comment: (NOTE) The Xpert Xpress SARS-CoV-2/FLU/RSV plus assay is intended as an aid in the diagnosis of influenza from Nasopharyngeal swab specimens and should not be used as a sole  basis for treatment. Nasal washings and aspirates are unacceptable for Xpert Xpress SARS-CoV-2/FLU/RSV testing.  Fact Sheet for Patients: BloggerCourse.com  Fact Sheet for Healthcare Providers: SeriousBroker.it  This test is not yet approved or cleared by the [place]  FDA and has  been authorized for detection and/or diagnosis of SARS-CoV-2 by FDA under an Emergency Use Authorization (EUA). This EUA will remain in effect (meaning this test can be used) for the duration of the COVID-19 declaration under Section 564(b)(1) of the Act, 21 U.S.C. section 360bbb-3(b)(1), unless the authorization is terminated or revoked.     Resp Syncytial Virus by PCR NEGATIVE NEGATIVE Final    Comment: (NOTE) Fact Sheet for Patients: BloggerCourse.com  Fact Sheet for Healthcare Providers: SeriousBroker.it  This test is not yet approved or cleared by the [place]  FDA and has been authorized for detection and/or diagnosis of SARS-CoV-2 by FDA under an Emergency Use Authorization (EUA). This EUA will remain in effect (meaning this test can be used) for the duration of the COVID-19 declaration under Section 564(b)(1) of the Act, 21 U.S.C. section 360bbb-3(b)(1), unless the authorization is terminated or revoked.  Performed at [facility], [address] [place], [place] [address]   Culture, blood (routine x 2)     Status: Abnormal   Collection Time: 12/04/23  3:48 PM   Specimen: BLOOD  Result Value Ref Range Status   Specimen Description   Final    BLOOD BLOOD RIGHT ARM Performed at [facility], [address] [place], [place] [address]    Special Requests   Final    BOTTLES DRAWN AEROBIC AND ANAEROBIC Blood Culture adequate volume Performed at [facility], [address], [place], [place] [address]    Culture  Setup Time   Final    GRAM NEGATIVE  RODS IN BOTH AEROBIC AND ANAEROBIC BOTTLES CRITICAL RESULT CALLED TO, READ BACK BY AND VERIFIED WITH: JASON ROBBINS @0700  12/05/23 MJU    Culture SERRATIA MARCESCENS (A)  Final   Report Status 12/07/2023 FINAL  Final   Organism ID, Bacteria SERRATIA MARCESCENS  Final      Susceptibility   Serratia marcescens - MIC*    CEFEPIME  <=0.12 SENSITIVE Sensitive     ERTAPENEM  <=0.12 SENSITIVE Sensitive     CEFTRIAXONE  1 SENSITIVE Sensitive     CIPROFLOXACIN  0.12 SENSITIVE Sensitive     GENTAMICIN  <=1 SENSITIVE Sensitive     MEROPENEM <=0.25 SENSITIVE Sensitive     TRIMETH /SULFA  <=20 SENSITIVE Sensitive     AMIKACIN 4 SENSITIVE Sensitive     CEFTAZIDIME/AVIBACTAM <=0.12 SENSITIVE Sensitive ug/mL    MEROPENEM/VABORBACTAM <=0.5 SENSITIVE Sensitive ug/mL    TOBRAMYCIN 4 SENSITIVE Sensitive     * SERRATIA MARCESCENS  Culture, blood (routine x 2)     Status: Abnormal   Collection Time: 12/04/23  3:48 PM   Specimen: BLOOD  Result Value Ref Range Status   Specimen Description   Final    BLOOD BLOOD LEFT ARM Performed at [facility], [address] [place], [place] [address]    Special Requests   Final    BOTTLES DRAWN AEROBIC AND ANAEROBIC Blood Culture adequate volume Performed at [facility], [address] [place], [place] [address]    Culture  Setup Time   Final    IN BOTH AEROBIC AND ANAEROBIC BOTTLES GRAM NEGATIVE RODS CRITICAL RESULT CALLED TO, READ BACK BY AND VERIFIED WITH: JASON ROBBINS @0700  12/05/23 MJU    Culture (A)  Final    SERRATIA MARCESCENS SUSCEPTIBILITIES PERFORMED ON PREVIOUS CULTURE WITHIN THE LAST 5 DAYS. Performed at [facility], [address], [place], [place] [address]    Report Status 12/07/2023 FINAL  Final  Blood Culture ID Panel (Reflexed)  Status: Abnormal   Collection Time: 12/04/23  3:48 PM  Result Value Ref Range Status   Enterococcus faecalis NOT DETECTED NOT DETECTED Final   Enterococcus Faecium NOT DETECTED NOT  DETECTED Final   Listeria monocytogenes NOT DETECTED NOT DETECTED Final   Staphylococcus species NOT DETECTED NOT DETECTED Final   Staphylococcus aureus (BCID) NOT DETECTED NOT DETECTED Final   Staphylococcus epidermidis NOT DETECTED NOT DETECTED Final   Staphylococcus lugdunensis NOT DETECTED NOT DETECTED Final   Streptococcus species NOT DETECTED NOT DETECTED Final   Streptococcus agalactiae NOT DETECTED NOT DETECTED Final   Streptococcus pneumoniae NOT DETECTED NOT DETECTED Final   Streptococcus pyogenes NOT DETECTED NOT DETECTED Final   A.calcoaceticus-baumannii NOT DETECTED NOT DETECTED Final   Bacteroides fragilis NOT DETECTED NOT DETECTED Final   Enterobacterales DETECTED (A) NOT DETECTED Final    Comment: Enterobacterales represent a large order of gram negative bacteria, not a single organism. CRITICAL RESULT CALLED TO, READ BACK BY AND VERIFIED WITH: JASON ROBBINS @0700  12/05/23 MJU    Enterobacter cloacae complex NOT DETECTED NOT DETECTED Final   Escherichia coli NOT DETECTED NOT DETECTED Final   Klebsiella aerogenes NOT DETECTED NOT DETECTED Final   Klebsiella oxytoca NOT DETECTED NOT DETECTED Final   Klebsiella pneumoniae NOT DETECTED NOT DETECTED Final   Proteus species NOT DETECTED NOT DETECTED Final   Salmonella species NOT DETECTED NOT DETECTED Final   Serratia marcescens DETECTED (A) NOT DETECTED Final    Comment: CRITICAL RESULT CALLED TO, READ BACK BY AND VERIFIED WITH: JASON ROBBINS @0700  12/05/23 MJU    Haemophilus influenzae NOT DETECTED NOT DETECTED Final   Neisseria meningitidis NOT DETECTED NOT DETECTED Final   Pseudomonas aeruginosa NOT DETECTED NOT DETECTED Final   Stenotrophomonas maltophilia NOT DETECTED NOT DETECTED Final   Candida albicans NOT DETECTED NOT DETECTED Final   Candida auris NOT DETECTED NOT DETECTED Final   Candida glabrata NOT DETECTED NOT DETECTED Final   Candida krusei NOT DETECTED NOT DETECTED Final   Candida parapsilosis NOT DETECTED  NOT DETECTED Final   Candida tropicalis NOT DETECTED NOT DETECTED Final   Cryptococcus neoformans/gattii NOT DETECTED NOT DETECTED Final   CTX-M ESBL NOT DETECTED NOT DETECTED Final   Carbapenem resistance IMP NOT DETECTED NOT DETECTED Final   Carbapenem resistance KPC NOT DETECTED NOT DETECTED Final   Carbapenem resistance NDM NOT DETECTED NOT DETECTED Final   Carbapenem resist OXA 48 LIKE NOT DETECTED NOT DETECTED Final   Carbapenem resistance VIM NOT DETECTED NOT DETECTED Final    Comment: Performed at [facility], [address] [place], [place] [address]  MRSA Next Gen by PCR, Nasal     Status: None   Collection Time: 12/04/23  9:30 PM   Specimen: Nasal Mucosa; Nasal Swab  Result Value Ref Range Status   MRSA by PCR Next Gen NOT DETECTED NOT DETECTED Final    Comment: (NOTE) The GeneXpert MRSA Assay (FDA approved for NASAL specimens only), is one component of a comprehensive MRSA colonization surveillance program. It is not intended to diagnose MRSA infection nor to guide or monitor treatment for MRSA infections. Test performance is not FDA approved in patients less than 80 years old. Performed at [facility], [address] [place], [place] [address]   Culture, blood (Routine X 2) w Reflex to ID Panel     Status: None   Collection Time: 12/08/23  2:23 PM   Specimen: BLOOD  Result Value Ref Range Status   Specimen Description BLOOD BLOOD  RIGHT HAND  Final   Special Requests   Final    BOTTLES DRAWN AEROBIC AND ANAEROBIC Blood Culture adequate volume   Culture   Final    NO GROWTH 5 DAYS Performed at [facility], [address] [place], [place] [address]    Report Status 12/13/2023 FINAL  Final  Culture, blood (Routine X 2) w Reflex to ID Panel     Status: None   Collection Time: 12/08/23  2:24 PM   Specimen: BLOOD RIGHT HAND  Result Value Ref Range Status   Specimen Description BLOOD RIGHT HAND  Final   Special Requests   Final     AEROBIC BOTTLE ONLY Blood Culture results may not be optimal due to an inadequate volume of blood received in culture bottles   Culture   Final    NO GROWTH 5 DAYS Performed at [facility], [address] [place], [place] [address]    Report Status 12/13/2023 FINAL  Final  Aerobic/Anaerobic Culture w Gram Stain (surgical/deep wound)     Status: None   Collection Time: 12/11/23  2:09 PM   Specimen: PATH Soft tissue  Result Value Ref Range Status   Specimen Description TISSUE  Final   Special Requests RIGHT GROIN HEMOTOMA  Final   Gram Stain   Final    FEW WBC PRESENT, PREDOMINANTLY PMN NO ORGANISMS SEEN    Culture   Final    FEW SERRATIA MARCESCENS NO ANAEROBES ISOLATED Performed at [facility], [address] [place], [place] [address]    Report Status 12/16/2023 FINAL  Final   Organism ID, Bacteria SERRATIA MARCESCENS  Final      Susceptibility   Serratia marcescens - MIC*    CEFEPIME  <=0.12 SENSITIVE Sensitive     ERTAPENEM  <=0.12 SENSITIVE Sensitive     CEFTRIAXONE  1 SENSITIVE Sensitive     CIPROFLOXACIN  0.12 SENSITIVE Sensitive     GENTAMICIN  <=1 SENSITIVE Sensitive     MEROPENEM <=0.25 SENSITIVE Sensitive     TRIMETH /SULFA  <=20 SENSITIVE Sensitive     * FEW SERRATIA MARCESCENS  Aerobic/Anaerobic Culture w Gram Stain (surgical/deep wound)     Status: None   Collection Time: 12/11/23  3:32 PM   Specimen: PATH Soft tissue  Result Value Ref Range Status   Specimen Description TISSUE  Final   Special Requests LEFT SFA THROMBOSIS AND BOVINE PATCH  Final   Gram Stain NO WBC SEEN NO ORGANISMS SEEN   Final   Culture   Final    RARE SERRATIA MARCESCENS NO ANAEROBES ISOLATED Performed at [facility], [address] [place], [place] [address]    Report Status 12/16/2023 FINAL  Final   Organism ID, Bacteria SERRATIA MARCESCENS  Final      Susceptibility   Serratia marcescens - MIC*    CEFEPIME  <=0.12 SENSITIVE Sensitive     ERTAPENEM  <=0.12  SENSITIVE Sensitive     CEFTRIAXONE  8 RESISTANT Resistant     CIPROFLOXACIN  0.12 SENSITIVE Sensitive     GENTAMICIN  <=1 SENSITIVE Sensitive     MEROPENEM <=0.25 SENSITIVE Sensitive     TRIMETH /SULFA  <=20 SENSITIVE Sensitive     * RARE SERRATIA MARCESCENS  Gastrointestinal Panel by PCR , Stool     Status: None   Collection Time: 12/19/23  8:28 AM   Specimen: Stool  Result Value Ref Range Status   Campylobacter species NOT DETECTED NOT DETECTED Final   Plesimonas shigelloides NOT DETECTED NOT DETECTED Final   Salmonella species  NOT DETECTED NOT DETECTED Final   Yersinia enterocolitica NOT DETECTED NOT DETECTED Final   Vibrio species NOT DETECTED NOT DETECTED Final   Vibrio cholerae NOT DETECTED NOT DETECTED Final   Enteroaggregative E coli (EAEC) NOT DETECTED NOT DETECTED Final   Enteropathogenic E coli (EPEC) NOT DETECTED NOT DETECTED Final   Enterotoxigenic E coli (ETEC) NOT DETECTED NOT DETECTED Final   Shiga like toxin producing E coli (STEC) NOT DETECTED NOT DETECTED Final   Shigella/Enteroinvasive E coli (EIEC) NOT DETECTED NOT DETECTED Final   Cryptosporidium NOT DETECTED NOT DETECTED Final   Cyclospora cayetanensis NOT DETECTED NOT DETECTED Final   Entamoeba histolytica NOT DETECTED NOT DETECTED Final   Giardia lamblia NOT DETECTED NOT DETECTED Final   Adenovirus F40/41 NOT DETECTED NOT DETECTED Final   Astrovirus NOT DETECTED NOT DETECTED Final   Norovirus GI/GII NOT DETECTED NOT DETECTED Final   Rotavirus A NOT DETECTED NOT DETECTED Final   Sapovirus (I, II, IV, and V) NOT DETECTED NOT DETECTED Final    Comment: Performed at [facility], [address] [place], [place] [address]  Aerobic/Anaerobic Culture w Gram Stain (surgical/deep wound)     Status: None (Preliminary result)   Collection Time: 12/28/23  9:17 AM   Specimen: Soft Tissue, Other  Result Value Ref Range Status   Specimen Description   Final    TISSUE SOFT Performed at [facility], [address] [place], [place] [address]    Special Requests   Final    RIGHT GROIN WOUND SEROMA Performed at [facility], [address] [place], [place] [address]    Gram Stain RARE WBC SEEN NO ORGANISMS SEEN   Final   Culture   Final    RARE YEAST IDENTIFICATION TO FOLLOW Performed at [facility], [address], [place], [place] [address]    Report Status PENDING  Incomplete    Time coordinating discharge: Over 30 minutes  Marien LITTIE Piety, MD  [facility] 01/01/2024, 10:46 AM

## 2024-01-01 NOTE — TOC Transition Note (Signed)
 Transition of Care [facility]) - Discharge Note   Patient Details  Name: Manuel [name] MRN: [id number] Date of Birth: 09-Dec-1945  [facility] CM/SW Contact:  Corean ONEIDA Haddock, RN Phone Number: 01/01/2024, 3:28 PM   Clinical Narrative:    Shara received for [name] confirms they have wound vac available   Patient will DC to: [facility] Anticipated DC date: 01/01/24  Family notified: wife Lobbyist by: [facility]   Per MD patient ready for DC to . RN, patient, patient's family, and facility notified of DC. Discharge Summary sent to facility. RN given number for report. DC packet on chart. Ambulance transport requested for patient.   TOC signing off.    Barriers to Discharge: Continued Medical Work up   Patient Goals and CMS Choice            Discharge Placement                       Discharge Plan and Services Additional resources added to the After Visit Summary for                                       Social Drivers of Health (SDOH) Interventions SDOH Screenings   Food Insecurity: No Food Insecurity (12/05/2023)  Housing: Low Risk  (12/05/2023)  Transportation Needs: No Transportation Needs (12/05/2023)  Utilities: Not At Risk (12/05/2023)  Depression (PHQ2-9): High Risk (09/11/2023)  Financial Resource Strain: Low Risk  (07/11/2023)  Social Connections: Moderately Isolated (12/05/2023)  Tobacco Use: Low Risk  (12/23/2023)     Readmission Risk Interventions    12/05/2023    3:09 PM 07/09/2023    8:30 PM 02/18/2023   11:58 AM  Readmission Risk Prevention Plan  Transportation Screening Complete Complete   PCP or Specialist Appt within 3-5 Days   Complete  HRI or Home Care Consult   Complete  Social Work Consult for Recovery Care Planning/Counseling   Complete  Palliative Care Screening   Not Applicable  Medication Review Oceanographer) Complete Complete Complete  PCP or Specialist appointment within 3-5 days of discharge  Complete    HRI or Home Care Consult  Complete   SW Recovery Care/Counseling Consult Complete Complete   Palliative Care Screening Not Applicable Not Applicable   Skilled Nursing Facility Not Applicable Not Applicable

## 2024-01-01 NOTE — Progress Notes (Signed)
 Handoff report given to [facility]. Patient will be transporting by non emergent EMS with PICC line in place. Patients belongings including his prosthesis will be transporting with patient. Wound vac will be clamped prior to transport to receiving facility.

## 2024-01-02 ENCOUNTER — Telehealth (INDEPENDENT_AMBULATORY_CARE_PROVIDER_SITE_OTHER): Payer: Self-pay

## 2024-01-02 ENCOUNTER — Inpatient Hospital Stay: Admitting: Family Medicine

## 2024-01-02 LAB — AEROBIC/ANAEROBIC CULTURE W GRAM STAIN (SURGICAL/DEEP WOUND): Culture: NO GROWTH

## 2024-01-02 NOTE — Telephone Encounter (Signed)
 Patient spouse reach out to the office concern with wound vac. Patient vac was beeping for some time and she is concern with the care at the facility. I did reach out to [facility] to speak with his nurse but I left a message to have the nurse return call to office.

## 2024-01-05 NOTE — Telephone Encounter (Signed)
 Spoken to patient's spouse regarding Fallon's comments. Verbalized understanding.

## 2024-01-05 NOTE — Telephone Encounter (Signed)
 Patient's spouse called stating that [facility] told her they cannot bring patient to [facility] tomorrow for his 10 day wound vac check.   Called and spoke to [place] at [facility] [phone]) who stated yes, they are NOT able to bring patient in for [facility] appointment tomorrow. Grayce said the next time they are able to bring the patient in would be 01/15/24 due to transportation issues.  Please advise.

## 2024-01-05 NOTE — Telephone Encounter (Signed)
 I spoke with Manuel [name] regarding the importance of making sure that he has his wound vac in place.  Apparently the patient is refusing to allow it to be put back on.  His wife called earlier, concerned they were taking his vac off improperly.  I tried to contact the wife to discuss this so that she may be able to talk to her husband to convince him to wear his wound vac

## 2024-01-05 NOTE — Telephone Encounter (Signed)
 Patient's spouse return my phone call. She stated that patient's wound vac was removed at [facility] place for the second time. She believe the wound vac was not the problem but the location of the wound. The facility did the wet to dry method and she was concerned about that so she called the on call doctor who reassure patient's spouse. Patient's spouse is concerned that the wound is not being taken care as it should be because patient is not being taken care overall over at [facility] place.  Patient does have appointment for a follow up on the wound on Tuesday, 01/06/2024 with Orvin.  Patient's spouse is there anything she should do in the meantime.

## 2024-01-05 NOTE — Telephone Encounter (Signed)
 We can address those concerns at her follow-up visit tomorrow.  In the interim they should continue with the wet-to-dry dressing

## 2024-01-05 NOTE — Telephone Encounter (Signed)
 I need to talk to someone medical then, to reiterate how important that it is that they keep his wound vac on..can we call them and get a contact for someone there

## 2024-01-05 NOTE — Telephone Encounter (Signed)
 Patient's spouse left 2 message on Friday afternoon regarding the wound vac and the swelling that patient is having.  She did mention wound vac was taken off at [facility] place and the wound is not better. She was thinking about taking patient to the ED.  I did leave a voicemail for patient's spouse to call be back with an update.

## 2024-01-06 ENCOUNTER — Ambulatory Visit: Attending: Cardiology | Admitting: Cardiology

## 2024-01-06 ENCOUNTER — Inpatient Hospital Stay: Admitting: Infectious Diseases

## 2024-01-06 ENCOUNTER — Ambulatory Visit (INDEPENDENT_AMBULATORY_CARE_PROVIDER_SITE_OTHER): Admitting: Nurse Practitioner

## 2024-01-06 NOTE — Progress Notes (Deleted)
  Cardiology Office Note   Date:  01/06/2024  ID:  Renel, Ende [birth year], MRN [id number] PCP: [name], [name], MD  [facility] Providers Cardiologist:  None { Click to update primary MD,subspecialty MD or APP then REFRESH:1}    History of Present Illness Manuel [name] is a 78 y.o. 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     ROS: ***  Studies Reviewed      *** Risk Assessment/Calculations {Does this patient have ATRIAL FIBRILLATION?[phone]} No BP recorded.  {Refresh Note OR Click here to enter BP  :1}***       Physical Exam VS:  There were no vitals taken for this visit.       Wt Readings from Last 3 Encounters:  12/24/23 204 lb 12.9 oz (92.9 kg)  11/18/23 181 lb 8 oz (82.3 kg)  09/11/23 191 lb (86.6 kg)    GEN: Well nourished, well developed in no acute distress NECK: No JVD; No carotid bruits CARDIAC: ***RRR, no murmurs, rubs, gallops RESPIRATORY:  Clear to auscultation without rales, wheezing or rhonchi  ABDOMEN: Soft, non-tender, non-distended EXTREMITIES:  No edema; No deformity   ASSESSMENT AND PLAN ***    {Are you ordering a CV Procedure (e.g. stress test, cath, DCCV, TEE, etc)?   Press F2        :[id number]}  Dispo: ***  Signed, Lainy Wrobleski, NP

## 2024-01-08 ENCOUNTER — Telehealth: Payer: Self-pay | Admitting: Family

## 2024-01-08 NOTE — Telephone Encounter (Signed)
 Called to confirm/remind patient of their appointment at the [facility] on 01/08/24.   Appointment:   [] Confirmed  [x] Left mess   [] No answer/No voice mail  [] VM Full/unable to leave message  [] Phone not in service  Patient reminded to bring all medications and/or complete list.  Confirmed patient has transportation. Gave directions, instructed to utilize valet parking.

## 2024-01-08 NOTE — Progress Notes (Deleted)
 [facility] Note   Referring Physician: recent admission PCP: [name], Devere POUR, MD (last seen 03/25) Cardiologist: Hilarie Rocher, MD (to be seen 04/25)  Chief Complaint: shortness of breath  HPI:  Manuel [name] is a 78 y/o male with a history of CAD, PAD s/p right BKA, gout, DM, hyperlipidemia, HTN, depression, anxiety, insomnia and chronic heart failure. CAD s/p PCI to [facility] with overlapping stents in 2011. extensive PAD s/p PTA L common Iliac, R peroneal, R distal SFA/prox popliteal, and stent to R distal SFA/prox popliteal arteries (05/14/21). Echo in 10/2022 with LVEF of 25-30%.   Admitted 07/09/23 due to shortness of breath, decreased urine output, and inability to sleep for the last year. Found to have NSTEMI with peak troponin of 1045. Echo 06/2023 with LVEF 25-30%, moderately dilated LV, and mild valvular disease. LHC 06/2023 where 3 DES were placed to the LAD. Chest x-ray noted mild diffuse vascular congestion, bilateral trace pleural effusions, concerning for CHF.   He presents today, with his girlfriend, for his initial HF visit with a chief complaint of minimal shortness of breath with exertion. Has associated fatigue, chronic difficulty sleeping and slight swelling in left lower leg along with this. Has been in a wheelchair for the last 3 months due to right BKA. Denies chest pain, palpitations, abdominal distention or dizziness. He says that he's taking trazodone  for sleep but says that it doesn't help. Tried melatonin in the past which also didn't work.   Drinks a 12 ounce can of beer daily. No liquor in the last 4-5 months, denies tobacco/ drug use.   Would like a referral to [facility] cardiology as that is who did his cath during the hospital and they plan to continue with [facility]. To have upcoming sleep study done.   Review of Systems: [y] = yes, [ ]  = no   General: Weight gain [ ] ; Weight loss [ ] ; Anorexia [ ] ; Fatigue Davis.Dad ]; Fever [ ] ; Chills [ ] ; Weakness [ ]    Cardiac: Chest pain/pressure [ ] ; Resting SOB [ ] ; Exertional SOB Davis.Dad ]; Orthopnea [ ] ; Pedal Edema Davis.Dad ]; Palpitations [ ] ; Syncope [ ] ; Presyncope [ ] ; Paroxysmal nocturnal dyspnea[ ]   Pulmonary: Cough [ ] ; Wheezing[ ] ; Hemoptysis[ ] ; Sputum [ ] ; Snoring [ ]   GI: Vomiting[ ] ; Dysphagia[ ] ; Melena[ ] ; Hematochezia [ ] ; Heartburn[ ] ; Abdominal pain [ ] ; Constipation [ ] ; Diarrhea [ ] ; BRBPR [ ]   GU: Hematuria[ ] ; Dysuria [ ] ; Nocturia[ ]   Vascular: Pain in legs with walking [ ] ; Pain in feet with lying flat [ ] ; Non-healing sores [ ] ; Stroke [ ] ; TIA [ ] ; Slurred speech [ ] ;  Neuro: Headaches[ ] ; Vertigo[ ] ; Seizures[ ] ; Paresthesias[ ] ;Blurred vision [ ] ; Diplopia [ ] ; Vision changes [ ]   Ortho/Skin: Arthritis [ ] ; Joint pain [ ] ; Muscle pain [ ] ; Joint swelling [ ] ; Back Pain [ ] ; Rash [ ]   Psych: Depression[ ] ; Anxiety[ ]   Heme: Bleeding problems [ ] ; Clotting disorders [ ] ; Anemia [ ]   Endocrine: Diabetes Davis.Dad ]; Thyroid dysfunction[ ]    Past Medical History:  Diagnosis Date   Acute ST elevation myocardial infarction (STEMI) of inferior wall (HCC) 04/19/2010   a.) transfered from [facility] to [facility] --> LHC/PCI (very difficult procedure) --> 3.0 x 23 mm and 3.0 x 12 mm Xience stents to RCA   Allergies    Arthritis    Benign essential hypertension    Bilateral carotid artery disease (HCC) 05/08/2021  a.) carotid doppler 05/08/2021: 1-39% BICA   CAD (coronary artery disease) 04/19/2010   a.) inferior STEMI 04/19/2010 --> LHC/PCI: 50-70% pD1, 80% pRI, 90/90/90% RCA (overlapping 3.0 x 23 and 3.0 x 12 mm Xience DES); b.) MV 11/10/2018: fixed minimally reversible inferior perfusion defect   Cellulitis of foot    Chronic HFrEF (heart failure with reduced ejection fraction) (HCC)    DDD (degenerative disc disease), cervical    Diabetes mellitus type 2, insulin  dependent (HCC)    Diverticulosis    Full dentures    Gout    Hard of hearing    History of bilateral cataract extraction 2022   History  of ETOH abuse    Hyperlipidemia    Ischemic cardiomyopathy 04/19/2010   a.) TTE 04/19/2010: 40%; b.) TTE 04/20/2014: EF >55%; c.) TTE 11/10/2018: EF 45%; d.) TTE 11/14/2022: EF 25-30%; e. 06/2023 Echo: EF 25-30%, nl RV size/fxn. Mild Manuel. Mild-mod TR. Ao sclerosis w/o stenosis.   Long term current use of aspirin     Long term current use of clopidogrel     Lumbar degenerative disc disease    Lumbar radiculopathy    Lumbar vertebral fracture (chronic superior endplate of L1)    NSTEMI (non-ST elevated myocardial infarction) (HCC) 01/15/2023   OSA (obstructive sleep apnea)    a.) unable to tolerate nocturnal PAP therapy   Peripheral artery disease (HCC)    a.) stenting 05/14/21: 12 mm x 12 cm LifeStent RIGHT dis SFA/prox pop; b.) s/p cath directed thrombolysis RIGHT SFA/pop 06/14/21; c.) s/p mech thrombectomy + stenting 06/15/21: 8 mm x 25cm & 8 mm x 7.5cm Viabahn; d.) s/p BILAT CFA, profunda femoris, SFA endarterectomies + fogarty embolectomy + stenting 11/15/22: 12mm x 58mm Lifestream BILAT CIAs, 14 mm x 6 cm Lifestream & 13 mm x 5 cm Viabahn LEFT EIA   Peripheral neuropathy    Umbilical hernia     Current Outpatient Medications  Medication Sig Dispense Refill   artificial tears ophthalmic solution Place 1 drop into both eyes as needed for dry eyes. 15 mL 0   aspirin  EC 81 MG tablet Take 1 tablet (81 mg total) by mouth daily. 150 tablet 2   atorvastatin  (LIPITOR ) 80 MG tablet Take 1 tablet (80 mg total) by mouth at bedtime. 90 tablet 3   carvedilol  (COREG ) 3.125 MG tablet Take 1 tablet (3.125 mg total) by mouth 2 (two) times daily with a meal.     clopidogrel  (PLAVIX ) 75 MG tablet Take 1 tablet (75 mg total) by mouth daily. 90 tablet 3   dapagliflozin  propanediol (FARXIGA ) 10 MG TABS tablet Take 1 tablet (10 mg total) by mouth daily. 90 tablet 3   Ensure Max Protein (ENSURE MAX PROTEIN) LIQD Take 330 mLs (11 oz total) by mouth 2 (two) times daily. (Patient taking differently: Take 11 oz by mouth  daily.) 10000 mL 3   ertapenem  (INVANZ ) IVPB Inject 1 g into the vein daily for 23 days. Indication:  Serratia bacteremia and infected vascular graft First Dose: Yes Last Day of Therapy:  01/22/2024 Labs - Once weekly:  CBC/D and CMP Fax weekly lab results  promptly to [phone] Method of administration: Mini-Bag Plus / Gravity Method of administration may be changed at the discretion of home infusion pharmacist based upon assessment of the patient and/or caregiver's ability to self-administer the medication ordered. Please leave PIC in place until doctor has seen patient or been notified Call [phone] with nay questions or critical values 24 Units 0   escitalopram  (  LEXAPRO ) 10 MG tablet Take 1 tablet (10 mg total) by mouth daily.     ferrous sulfate  325 (65 FE) MG EC tablet Take 325 mg by mouth daily with breakfast.     fluticasone  (FLONASE ) 50 MCG/ACT nasal spray Place 1 spray into both nostrils 2 (two) times daily as needed for allergies or rhinitis.     folic acid  (FOLVITE ) 1 MG tablet Take 1 tablet (1 mg total) by mouth daily. 90 tablet 1   gabapentin  (NEURONTIN ) 100 MG capsule 1 po at bedtime. 90 capsule 3   insulin  aspart (NOVOLOG ) 100 UNIT/ML injection Inject 0-9 Units into the skin 3 (three) times daily with meals.     loratadine  (CLARITIN ) 10 MG tablet Take 1 tablet (10 mg total) by mouth daily as needed for allergies.     methocarbamol  (ROBAXIN ) 500 MG tablet Take 500 mg by mouth every 8 (eight) hours as needed for muscle spasms.     nitroGLYCERIN  (NITROSTAT ) 0.4 MG SL tablet Place 1 tablet (0.4 mg total) under the tongue every 5 (five) minutes as needed for chest pain. Max: 3 tablets per day     ondansetron  (ZOFRAN ) 4 MG tablet Take 1 tablet (4 mg total) by mouth every 6 (six) hours as needed for nausea.     pantoprazole  (PROTONIX ) 40 MG tablet Take 1 tablet (40 mg total) by mouth daily.     polyethylene glycol (MIRALAX  / GLYCOLAX ) 17 g packet Take 17 g by mouth daily.      sacubitril -valsartan  (ENTRESTO ) 24-26 MG Take 1 tablet by mouth every 12 (twelve) hours. 180 tablet 3   spironolactone  (ALDACTONE ) 25 MG tablet Take 1 tablet (25 mg total) by mouth daily. 90 tablet 3   thiamine  (VITAMIN B-1) 100 MG tablet Take 1 tablet (100 mg total) by mouth daily. 90 tablet 0   traZODone  (DESYREL ) 100 MG tablet Take 1 tablet (100 mg total) by mouth at bedtime.     No current facility-administered medications for this visit.    No Known Allergies    Social History   Socioeconomic History   Marital status: Married    Spouse name: [name]   Number of children: 3   Years of education: Not on file   Highest education level: Not on file  Occupational History   Not on file  Tobacco Use   Smoking status: Never    Passive exposure: Never   Smokeless tobacco: Never  Vaping Use   Vaping status: Never Used  Substance and Sexual Activity   Alcohol  use: Yes    Alcohol /week: 21.0 standard drinks of alcohol     Types: 7 Glasses of wine, 14 Cans of beer per week    Comment: occassional   Drug use: No   Sexual activity: Not on file  Other Topics Concern   Not on file  Social History Narrative   Not on file   Social Drivers of Health   Financial Resource Strain: Low Risk  (07/11/2023)   Overall Financial Resource Strain (CARDIA)    Difficulty of Paying Living Expenses: Not hard at all  Food Insecurity: No Food Insecurity (12/05/2023)   Hunger Vital Sign    Worried About Running Out of Food in the Last Year: Never true    Ran Out of Food in the Last Year: Never true  Transportation Needs: No Transportation Needs (12/05/2023)   PRAPARE - Administrator, Civil Service (Medical): No    Lack of Transportation (Non-Medical): No  Physical Activity: Not  on file  Stress: Not on file  Social Connections: Moderately Isolated (12/05/2023)   Social Connection and Isolation Panel    Frequency of Communication with Friends and Family: More than three times a week     Frequency of Social Gatherings with Friends and Family: More than three times a week    Attends Religious Services: Patient declined    Database administrator or Organizations: No    Attends Banker Meetings: Patient declined    Marital Status: Married  Catering manager Violence: Not At Risk (12/05/2023)   Humiliation, Afraid, Rape, and Kick questionnaire    Fear of Current or Ex-Partner: No    Emotionally Abused: No    Physically Abused: No    Sexually Abused: No      Family History  Problem Relation Age of Onset   Scoliosis Mother    Heart disease Father     There were no vitals filed for this visit.  Wt Readings from Last 3 Encounters:  12/24/23 204 lb 12.9 oz (92.9 kg)  11/18/23 181 lb 8 oz (82.3 kg)  09/11/23 191 lb (86.6 kg)   Lab Results  Component Value Date   CREATININE 0.71 12/31/2023   CREATININE 0.93 12/27/2023   CREATININE 0.77 12/22/2023    PHYSICAL EXAM:  General: Well appearing. No resp difficulty HEENT: normal Neck: supple, no JVD Cor: Regular rhythm, rate. No rubs, gallops or murmurs Lungs: clear Abdomen: soft, nontender, nondistended. Extremities: no cyanosis, clubbing, rash, 2+ pitting edema left lower leg up to knee; right BKA Neuro: alert & oriented X 3. Moves all 4 extremities w/o difficulty. Affect pleasant   ECG: not done   ASSESSMENT & PLAN:  1: ICM with reduced ejection fraction- - NYHA class II - fluid up with worsening symptoms and pedal edema - Echo 07/10/23 with LVEF 25-30%, moderately dilated LV, and mild valvular disease.  - continue carvedilol  25mg  BID - continue farxiga  10mg  daily - continue furosemide  40mg  daily - continue entresto  24/26mg  BID - continue spironolactone  25mg  daily - will add metolazone  2.5mg  daily X 3 doses with 40meq potassium daily X 3 doses - get compression sock and put on left leg - doesn't add salt to his food but does eat out 3/4 of the time; reviewed how to make low sodium choices -  BMET, pro-BNP today & will message PCP to recheck labs next week - BNP 07/09/23 was 2395.3  2: HTN- - BP 119/71 - saw PCP Marlee) 03/25; has upcoming PCP appt @ [facility] 04/25 - BMET 07/16/23 reviewed and showed sodium 133, potassium 3.9, creatinine 1.22 & GFR >60  3: CAD- - was previously followed at [facility] but is now being followed by [facility] (per his request) - does not have post-hospital f/u so referral placed today - [facility] 07/14/23: Severe three-vessel coronary artery disease. Right heart catheterization showed moderately elevated right and left-sided filling pressures, moderate to severe pulmonary hypertension and moderately reduced cardiac output. Successful angioplasty and 3 overlapped drug-eluting stent placement to the LAD. - continue atorvastatin  80mg  daily  4: DM- - A1c 07/10/23 was 8.0% - continue insulin   5: PAD- - saw vascular ([facility]) 02/25 - s/p PTA L common Iliac, R peroneal, R distal SFA/prox popliteal, and stent to R distal SFA/prox popliteal arteries (05/14/21) - continue plavix  75mg  daiy   Since he has PCP appt next week, will have him f/u here in a few weeks. Sooner if needed.   Ellouise DELENA Class, FNP 01/08/24

## 2024-01-09 ENCOUNTER — Encounter: Admitting: Family

## 2024-01-16 ENCOUNTER — Other Ambulatory Visit: Payer: Self-pay

## 2024-01-16 ENCOUNTER — Ambulatory Visit (INDEPENDENT_AMBULATORY_CARE_PROVIDER_SITE_OTHER): Admitting: Vascular Surgery

## 2024-01-16 ENCOUNTER — Encounter (INDEPENDENT_AMBULATORY_CARE_PROVIDER_SITE_OTHER): Payer: Self-pay | Admitting: Vascular Surgery

## 2024-01-16 ENCOUNTER — Encounter: Payer: Self-pay | Admitting: Emergency Medicine

## 2024-01-16 ENCOUNTER — Encounter: Payer: Self-pay | Admitting: *Deleted

## 2024-01-16 ENCOUNTER — Observation Stay
Admission: EM | Admit: 2024-01-16 | Discharge: 2024-01-26 | Disposition: A | Discharge status: Home Health | Source: Ambulatory Visit | Attending: Family Medicine | Admitting: Family Medicine

## 2024-01-16 VITALS — BP 110/63 | HR 77 | Resp 16

## 2024-01-16 DIAGNOSIS — Z8679 Personal history of other diseases of the circulatory system: Secondary | ICD-10-CM | POA: Diagnosis not present

## 2024-01-16 DIAGNOSIS — I5022 Chronic systolic (congestive) heart failure: Secondary | ICD-10-CM | POA: Diagnosis not present

## 2024-01-16 DIAGNOSIS — Z7982 Long term (current) use of aspirin: Secondary | ICD-10-CM | POA: Insufficient documentation

## 2024-01-16 DIAGNOSIS — G4733 Obstructive sleep apnea (adult) (pediatric): Secondary | ICD-10-CM | POA: Insufficient documentation

## 2024-01-16 DIAGNOSIS — Z792 Long term (current) use of antibiotics: Secondary | ICD-10-CM | POA: Diagnosis not present

## 2024-01-16 DIAGNOSIS — Z6830 Body mass index (BMI) 30.0-30.9, adult: Secondary | ICD-10-CM | POA: Diagnosis not present

## 2024-01-16 DIAGNOSIS — I5023 Acute on chronic systolic (congestive) heart failure: Secondary | ICD-10-CM | POA: Diagnosis present

## 2024-01-16 DIAGNOSIS — T8131XA Disruption of external operation (surgical) wound, not elsewhere classified, initial encounter: Principal | ICD-10-CM | POA: Insufficient documentation

## 2024-01-16 DIAGNOSIS — F1010 Alcohol abuse, uncomplicated: Secondary | ICD-10-CM | POA: Insufficient documentation

## 2024-01-16 DIAGNOSIS — T148XXD Other injury of unspecified body region, subsequent encounter: Secondary | ICD-10-CM | POA: Diagnosis present

## 2024-01-16 DIAGNOSIS — Z794 Long term (current) use of insulin: Secondary | ICD-10-CM | POA: Diagnosis not present

## 2024-01-16 DIAGNOSIS — Z79899 Other long term (current) drug therapy: Secondary | ICD-10-CM | POA: Diagnosis not present

## 2024-01-16 DIAGNOSIS — Z5189 Encounter for other specified aftercare: Principal | ICD-10-CM

## 2024-01-16 DIAGNOSIS — I110 Hypertensive heart disease with heart failure: Secondary | ICD-10-CM | POA: Insufficient documentation

## 2024-01-16 DIAGNOSIS — Z9889 Other specified postprocedural states: Secondary | ICD-10-CM

## 2024-01-16 DIAGNOSIS — E669 Obesity, unspecified: Secondary | ICD-10-CM | POA: Diagnosis not present

## 2024-01-16 DIAGNOSIS — L089 Local infection of the skin and subcutaneous tissue, unspecified: Secondary | ICD-10-CM

## 2024-01-16 DIAGNOSIS — R2689 Other abnormalities of gait and mobility: Secondary | ICD-10-CM | POA: Insufficient documentation

## 2024-01-16 DIAGNOSIS — T8130XA Disruption of wound, unspecified, initial encounter: Secondary | ICD-10-CM | POA: Insufficient documentation

## 2024-01-16 DIAGNOSIS — E1165 Type 2 diabetes mellitus with hyperglycemia: Secondary | ICD-10-CM | POA: Insufficient documentation

## 2024-01-16 DIAGNOSIS — M6281 Muscle weakness (generalized): Secondary | ICD-10-CM | POA: Insufficient documentation

## 2024-01-16 DIAGNOSIS — I2510 Atherosclerotic heart disease of native coronary artery without angina pectoris: Secondary | ICD-10-CM | POA: Insufficient documentation

## 2024-01-16 DIAGNOSIS — Y829 Unspecified medical devices associated with adverse incidents: Secondary | ICD-10-CM | POA: Insufficient documentation

## 2024-01-16 DIAGNOSIS — I739 Peripheral vascular disease, unspecified: Secondary | ICD-10-CM

## 2024-01-16 LAB — CBC
HCT: 38.3 % — ABNORMAL LOW (ref 39.0–52.0)
Hemoglobin: 12.3 g/dL — ABNORMAL LOW (ref 13.0–17.0)
MCH: 28.2 pg (ref 26.0–34.0)
MCHC: 32.1 g/dL (ref 30.0–36.0)
MCV: 87.8 fL (ref 80.0–100.0)
Platelets: 296 K/uL (ref 150–400)
RBC: 4.36 MIL/uL (ref 4.22–5.81)
RDW: 14.6 % (ref 11.5–15.5)
WBC: 9.5 K/uL (ref 4.0–10.5)
nRBC: 0 % (ref 0.0–0.2)

## 2024-01-16 LAB — BASIC METABOLIC PANEL WITH GFR
Anion gap: 12 (ref 5–15)
BUN: 29 mg/dL — ABNORMAL HIGH (ref 8–23)
CO2: 23 mmol/L (ref 22–32)
Calcium: 8.7 mg/dL — ABNORMAL LOW (ref 8.9–10.3)
Chloride: 98 mmol/L (ref 98–111)
Creatinine, Ser: 0.81 mg/dL (ref 0.61–1.24)
GFR, Estimated: >60 mL/min (ref >60)
Glucose, Bld: 281 mg/dL — ABNORMAL HIGH (ref 70–99)
Potassium: 4.1 mmol/L (ref 3.5–5.1)
Sodium: 133 mmol/L — ABNORMAL LOW (ref 135–145)

## 2024-01-16 NOTE — ED Notes (Signed)
 OR Charge Nurse contacted regarding the pts wound vac - she will send someone down to assist with troubleshooting. EDP and Primary RN made aware.

## 2024-01-16 NOTE — ED Provider Notes (Signed)
.-----------------------------------------   11:24 PM on 01/16/2024 -----------------------------------------  Blood pressure 115/76, pulse 75, temperature 98.3 F (36.8 C), temperature source Oral, resp. rate 18, height 5' 8 (1.727 m), weight 90.7 kg, SpO2 95%.  Assuming care from Dr. Jossie.  In short, Manuel [name] is a 78 y.o. male with a chief complaint of Wound Check (Wound vac unable to get a seal and is not working properly ) .  Refer to the original H&P for additional details.  The current plan of care is to follow-up on wound VAC application, able to be discharged with outpatient follow-up once wound VAC is able to be placed back on, and able to get machine to facility.  Received a call from vascular surgery, they will send down a nurse to apply wound VAC in the ED, she would like patient to be admitted for further management of his site infection and also because it will take some time to coordinate getting the wound VAC machine to his facility for him to use outpatient.  Will reach out to hospitalist for admission.  He is admitted.       Waymond Lorelle Cummins, MD 01/17/24 [phone]

## 2024-01-16 NOTE — ED Notes (Signed)
 OR nurse called away to a case will return. Requested order for wound vac be placed. Informed Dr. Jossie.

## 2024-01-16 NOTE — ED Triage Notes (Signed)
 Pt reports a fall 6 weeks ago and caused arterial bleed to right groin. Pt supposed to have wound vac on site. Pt states they have not been able to get it working at [facility] for about a week. States he just needs help getting the wound vac on.

## 2024-01-16 NOTE — ED Notes (Addendum)
 Manuel [name]

## 2024-01-16 NOTE — ED Notes (Signed)
 Per vascular, pt sent for possible re-admission and may need to go back to OR

## 2024-01-16 NOTE — ED Provider Notes (Signed)
 [facility] Provider Note   Event Date/Time   First MD Initiated Contact with Patient 01/16/24 2009     (approximate) History  No chief complaint on file.  HPI Manuel [name] is a 78 y.o. male with a recent past history of of wound to the right inguinal region who presents for failure of his wound VAC to this wound.  Patient states that he was seen in the vascular clinic today by Redell pace who recommended presenting to the emergency department for reevaluation of patient's wound VAC.  Patient states that anytime they try to place it at [facility] rehabilitation center, he only moves a slight amount and the seal on the wound VAC becomes dislodged and cannot get a good seal.  Patient requesting resources for troubleshooting this wound VAC.  Patient does endorse a small amount of redness surrounding the wound that he states only started after placing topical fungal powder on it and he told the nursing staff at his facility to stop it. ROS: Patient currently denies any vision changes, tinnitus, difficulty speaking, facial droop, sore throat, chest pain, shortness of breath, abdominal pain, nausea/vomiting/diarrhea, dysuria, or weakness/numbness/paresthesias in any extremity   Physical Exam  Triage Vital Signs: ED Triage Vitals  Encounter Vitals Group     BP 01/16/24 1544 123/86     Girls Systolic BP Percentile --      Girls Diastolic BP Percentile --      Boys Systolic BP Percentile --      Boys Diastolic BP Percentile --      Pulse Rate 01/16/24 1544 72     Resp 01/16/24 1544 16     Temp 01/16/24 1544 98.4 F (36.9 C)     Temp Source 01/16/24 1544 Oral     SpO2 01/16/24 1544 95 %     Weight 01/16/24 1546 200 lb (90.7 kg)     Height 01/16/24 1546 5' 8 (1.727 m)     Head Circumference --      Peak Flow --      Pain Score 01/16/24 1545 0     Pain Loc --      Pain Education --      Exclude from Growth Chart --    Most recent vital signs: Vitals:    01/16/24 1544 01/16/24 2030  BP: 123/86 (!) 144/78  Pulse: 72 71  Resp: 16 18  Temp: 98.4 F (36.9 C) 98.3 F (36.8 C)  SpO2: 95% 96%   General: Awake, oriented x4. CV:  Good peripheral perfusion. Resp:  Normal effort. Abd:  No distention. Other:  Elderly overweight Caucasian male resting comfortably in no acute distress.  There is a proximately 2 cm deep vertical wound that is approximately 10 cm by a 3 cm and traversing the inguinal crease with surrounding erythema ED Results / Procedures / Treatments  Labs (all labs ordered are listed, but only abnormal results are displayed) Labs Reviewed  CBC - Abnormal; Notable for the following components:      Result Value   Hemoglobin 12.3 (*)    HCT 38.3 (*)    All other components within normal limits  BASIC METABOLIC PANEL WITH GFR - Abnormal; Notable for the following components:   Sodium 133 (*)    Glucose, Bld 281 (*)    BUN 29 (*)    Calcium  8.7 (*)    All other components within normal limits   PROCEDURES: Critical Care performed: No Procedures MEDICATIONS ORDERED IN ED:  Medications - No data to display IMPRESSION / MDM / ASSESSMENT AND PLAN / ED COURSE  I reviewed the triage vital signs and the nursing notes.                             The patient is on the cardiac monitor to evaluate for evidence of arrhythmia and/or significant heart rate changes. Patient's presentation is most consistent with acute presentation with potential threat to life or bodily function. Patient is an 78 year old male with the above-stated past medical history who presents for troubleshooting of his right inguinal wound VAC.  Patient does have some surrounding erythema that looks like contact dermatitis.  Patient encouraged to continue holding this fungal powder. I spoke to our charge nurse about resources to replace patient's wound VAC so that the seal will remain intact.  Charge nurse states that the operating room will be sending out a nurse to  attempt to troubleshoot.  Care of this patient will be signed out to the oncoming physician at the end of my shift.  All pertinent patient information conveyed and all questions answered.  All further care and disposition decisions will be made by the oncoming physician.   FINAL CLINICAL IMPRESSION(S) / ED DIAGNOSES   Final diagnoses:  None   Rx / DC Orders   ED Discharge Orders     None      Note:  This document was prepared using Dragon voice recognition software and may include unintentional dictation errors.   Lacye Mccarn K, MD 01/17/24 1520

## 2024-01-16 NOTE — Progress Notes (Signed)
 Subjective:    Patient ID: Manuel [name], male    DOB: 1945/05/04, 78 y.o.   MRN: [id number] Chief Complaint  Patient presents with   Routine Post Op    [facility] 10 days wound check    Manuel [name] is an 78 yo male who presented to clinic today for post operative wound check post right femoral endarterectomy. Patient had an resection of infected right common femoral artery and removal of common femoral artery, profunda femoris artery, and superficial femoral artery stents. He had Harvest of right superficial femoral artery for bypass with endarterectomy of right superficial femoral artery to make it usable for bypass conduit and right sartorius flap replacement.    PROCEDURE: 1.   Right external iliac artery to profunda femoris artery bypass with autologous ipsilateral superficial femoral artery 2.   Resection of infected right common femoral artery and removal of common femoral artery, profunda femoris artery, and superficial femoral artery stents 3.   Harvest of right superficial femoral artery for bypass with endarterectomy of right superficial femoral artery to make it usable for bypass conduit 4.   Right sartorius flap placement 5.   Negative pressure dressing placement right groin 6.   Redo groin exploration for vascular surgery  Patient was discharged from the hospital to rehab with a wound vac in place to his right groin. Patient was discharged on IV Antibiotics for 6 weeks. Patient and wife both present to clinic today stating that his wound vac has not worked since he left the hospital. They can not seem to get it to keep suction. They do not want to go back to this facility as they have not been completing his rehabilitation due to his wound and wound vac issues. They also feel he has not been getting his antibiotics as prescribed.   I removed the wound vac in clinic today to assess the wound. It is beefy and red at the most distal part but it remains deep with what appears to be  a tunnel possibly. Therefore I have sent him to the emergency room to be admitted to the hospitalist service. I recommend consulting Infectious disease to follow as they know the patient and have been following him outpatient. Plan to take back to the operating room early next week for wound washout and either reapplication of wound vac or wet to dry dressing. Currently patient to have wet to dry saline dressing changes q12 hrs to right groin.                   Review of Systems  Constitutional: Negative.   Skin:  Positive for wound.  All other systems reviewed and are negative.      Objective:   Physical Exam Constitutional:      Appearance: Normal appearance. He is normal weight.  HENT:     Head: Normocephalic.  Eyes:     Pupils: Pupils are equal, round, and reactive to light.  Cardiovascular:     Rate and Rhythm: Normal rate and regular rhythm.     Pulses: Normal pulses.     Heart sounds: Normal heart sounds.  Pulmonary:     Effort: Pulmonary effort is normal.     Breath sounds: Normal breath sounds.  Abdominal:     General: Abdomen is flat. Bowel sounds are normal.     Palpations: Abdomen is soft.  Musculoskeletal:        General: Deformity present.     Comments: HX of Right BKA.  Skin:    General: Skin is warm and dry.     Capillary Refill: Capillary refill takes 2 to 3 seconds.     Comments: Positive Post operative wound to right groin.   Neurological:     General: No focal deficit present.     Mental Status: He is alert and oriented to person, place, and time. Mental status is at baseline.  Psychiatric:        Mood and Affect: Mood normal.        Behavior: Behavior normal.        Thought Content: Thought content normal.        Judgment: Judgment normal.     BP 110/63   Pulse 77   Resp 16   Past Medical History:  Diagnosis Date   Acute ST elevation myocardial infarction (STEMI) of inferior wall (HCC) 04/19/2010   a.) transfered from [facility] to [facility]  --> LHC/PCI (very difficult procedure) --> 3.0 x 23 mm and 3.0 x 12 mm Xience stents to RCA   Allergies    Arthritis    Benign essential hypertension    Bilateral carotid artery disease 05/08/2021   a.) carotid doppler 05/08/2021: 1-39% BICA   CAD (coronary artery disease) 04/19/2010   a.) inferior STEMI 04/19/2010 --> LHC/PCI: 50-70% pD1, 80% pRI, 90/90/90% RCA (overlapping 3.0 x 23 and 3.0 x 12 mm Xience DES); b.) MV 11/10/2018: fixed minimally reversible inferior perfusion defect   Cellulitis of foot    Chronic HFrEF (heart failure with reduced ejection fraction) (HCC)    DDD (degenerative disc disease), cervical    Diabetes mellitus type 2, insulin  dependent (HCC)    Diverticulosis    Full dentures    Gout    Hard of hearing    History of bilateral cataract extraction 2022   History of ETOH abuse    Hyperlipidemia    Ischemic cardiomyopathy 04/19/2010   a.) TTE 04/19/2010: 40%; b.) TTE 04/20/2014: EF >55%; c.) TTE 11/10/2018: EF 45%; d.) TTE 11/14/2022: EF 25-30%; e. 06/2023 Echo: EF 25-30%, nl RV size/fxn. Mild MR. Mild-mod TR. Ao sclerosis w/o stenosis.   Long term current use of aspirin     Long term current use of clopidogrel     Lumbar degenerative disc disease    Lumbar radiculopathy    Lumbar vertebral fracture (chronic superior endplate of L1)    NSTEMI (non-ST elevated myocardial infarction) (HCC) 01/15/2023   OSA (obstructive sleep apnea)    a.) unable to tolerate nocturnal PAP therapy   Peripheral artery disease    a.) stenting 05/14/21: 12 mm x 12 cm LifeStent RIGHT dis SFA/prox pop; b.) s/p cath directed thrombolysis RIGHT SFA/pop 06/14/21; c.) s/p mech thrombectomy + stenting 06/15/21: 8 mm x 25cm & 8 mm x 7.5cm Viabahn; d.) s/p BILAT CFA, profunda femoris, SFA endarterectomies + fogarty embolectomy + stenting 11/15/22: 12mm x 58mm Lifestream BILAT CIAs, 14 mm x 6 cm Lifestream & 13 mm x 5 cm Viabahn LEFT EIA   Peripheral neuropathy    Umbilical hernia     Social History    Socioeconomic History   Marital status: Married    Spouse name: [name]   Number of children: 3   Years of education: Not on file   Highest education level: Not on file  Occupational History   Not on file  Tobacco Use   Smoking status: Never    Passive exposure: Never   Smokeless tobacco: Never  Vaping Use   Vaping status: Never Used  Substance and Sexual Activity   Alcohol  use: Yes    Alcohol /week: 21.0 standard drinks of alcohol     Types: 7 Glasses of wine, 14 Cans of beer per week    Comment: occassional   Drug use: No   Sexual activity: Not on file  Other Topics Concern   Not on file  Social History Narrative   Not on file   Social Drivers of Health   Financial Resource Strain: Low Risk  (07/11/2023)   Overall Financial Resource Strain (CARDIA)    Difficulty of Paying Living Expenses: Not hard at all  Food Insecurity: No Food Insecurity (12/05/2023)   Hunger Vital Sign    Worried About Running Out of Food in the Last Year: Never true    Ran Out of Food in the Last Year: Never true  Transportation Needs: No Transportation Needs (12/05/2023)   PRAPARE - Administrator, Civil Service (Medical): No    Lack of Transportation (Non-Medical): No  Physical Activity: Not on file  Stress: Not on file  Social Connections: Moderately Isolated (12/05/2023)   Social Connection and Isolation Panel    Frequency of Communication with Friends and Family: More than three times a week    Frequency of Social Gatherings with Friends and Family: More than three times a week    Attends Religious Services: Patient declined    Active Member of Clubs or Organizations: No    Attends Banker Meetings: Patient declined    Marital Status: Married  Catering manager Violence: Not At Risk (12/05/2023)   Humiliation, Afraid, Rape, and Kick questionnaire    Fear of Current or Ex-Partner: No    Emotionally Abused: No    Physically Abused: No    Sexually Abused: No    Past  Surgical History:  Procedure Laterality Date   AMPUTATION Right 11/18/2022   Procedure: AMPUTATION 4TH AND 5TH RAY;  Surgeon: [name], [name];  Location: [facility];  Service: Orthopedics/Podiatry;  Laterality: Right;  4th and 5th toe   AMPUTATION Right 04/20/2023   Procedure: AMPUTATION BELOW KNEE;  Surgeon: [name], MD;  Location: [facility];  Service: Vascular;  Laterality: Right;  block then general   APPLICATION OF WOUND VAC Right 01/09/2023   Procedure: APPLICATION OF WOUND VAC;  Surgeon: [name], MD;  Location: [facility];  Service: Vascular;  Laterality: Right;   APPLICATION OF WOUND VAC Right 12/12/2023   Procedure: APPLICATION, WOUND VAC;  Surgeon: [name], MD;  Location: [facility];  Service: Vascular;  Laterality: Right;   APPLICATION OF WOUND VAC Right 12/17/2023   Procedure: APPLICATION, WOUND VAC;  Surgeon: [name], MD;  Location: [facility];  Service: Vascular;  Laterality: Right;  EXCHANGE   APPLICATION OF WOUND VAC Right 12/23/2023   Procedure: APPLICATION, WOUND VAC;  Surgeon: [name], MD;  Location: [facility];  Service: General;  Laterality: Right;   APPLICATION OF WOUND VAC Right 12/11/2023   Procedure: APPLICATION, WOUND VAC;  Surgeon: [name], MD;  Location: [facility];  Service: Vascular;  Laterality: Right;   CATARACT EXTRACTION W/PHACO Right 03/14/2021   Procedure: CATARACT EXTRACTION PHACO AND INTRAOCULAR LENS PLACEMENT (IOC) RIGHT DIABETIC;  Surgeon: [name], MD;  Location: [facility];  Service: Ophthalmology;  Laterality: Right;  Diabetic 16.78 01:39.9   CATARACT EXTRACTION W/PHACO Left 03/28/2021   Procedure: CATARACT EXTRACTION PHACO AND INTRAOCULAR LENS PLACEMENT (IOC) LEFT DIABETIC 6.93 01:22.0;  Surgeon: [name], MD;  Location:  [facility];  Service: Ophthalmology;  Laterality: Left;  Diabetic   COLONOSCOPY     CORONARY ANGIOPLASTY WITH STENT PLACEMENT  03/2010   Procedure: CORONARY ANGIOPLASTY  WITH STENT PLACEMENT; Location: [name]   CORONARY STENT INTERVENTION N/A 07/14/2023   Procedure: CORONARY STENT INTERVENTION;  Surgeon: [name], MD;  Location: [facility];  Service: Cardiovascular;  Laterality: N/A;   ENDARTERECTOMY FEMORAL Bilateral 11/15/2022   Procedure: BILATERAL COMMON FEMORAL PROFUNDA FEMORIS AND SUPERFICIAL FEMORAL ARTERY ENDARTECTOMIES, RIGHT FOGARTY EMBOLECTOMY OF THE RIGHT SFA  AND  POPLITEAL ARTERIES. EZELLA AND RIGHT LOWER EXTREMITY ANGIOGRAM.;  Surgeon: [name], MD;  Location: [facility];  Service: Vascular;  Laterality: Bilateral;   ENDARTERECTOMY FEMORAL Right 12/11/2023   Procedure: ENDARTERECTOMY, FEMORAL;  Surgeon: [name], MD;  Location: [facility];  Service: Vascular;  Laterality: Right;  Re-do of femoral endarterectomy with sartorius flap   FEMORAL ARTERY EXPLORATION Right 12/12/2023   Procedure: EXPLORATION, ARTERY, FEMORAL- stop bleeding with suture repair anastomaosis , abx beads;  Surgeon: [name], MD;  Location: [facility];  Service: Vascular;  Laterality: Right;   GROIN DEBRIDEMENT Right 12/28/2023   Procedure: DEBRIDEMENT, INGUINAL REGION;  Surgeon: Elnor Norleen CROME, MD;  Location: [facility];  Service: Vascular;  Laterality: Right;   HEMATOMA EVACUATION N/A 12/12/2023   Procedure: EVACUATION HEMATOMA;  Surgeon: [name], MD;  Location: [facility];  Service: Vascular;  Laterality: N/A;   INSERTION OF ILIAC STENT Bilateral 11/15/2022   Procedure: BILATERAL STENT INSERTION IN BILATERAL  COMMON ILIAC ARTERY, STENT INSERTION OF LEFT EXTERNAL ILIAC ARTERY. ANGIOPLASTY RIGHT TIBIAL  AND POPLITEAL ARTERY.;  Surgeon: [name], MD;  Location: [facility];  Service: Vascular;  Laterality: Bilateral;   IRRIGATION AND DEBRIDEMENT KNEE Right 04/21/2023   Procedure: IRRIGATION AND DEBRIDEMENT KNEE;  Surgeon: Zafonte, Brian Domenick, MD;  Location: [facility];  Service: Orthopedics;  Laterality: Right;   LOWER EXTREMITY ANGIOGRAPHY Right 05/14/2021    Procedure: LOWER EXTREMITY ANGIOGRAPHY;  Surgeon: [name], MD;  Location: [facility];  Service: Cardiovascular;  Laterality: Right;   LOWER EXTREMITY ANGIOGRAPHY Right 06/14/2021   Procedure: Lower Extremity Angiography;  Surgeon: [name], MD;  Location: [facility];  Service: Cardiovascular;  Laterality: Right;   LOWER EXTREMITY ANGIOGRAPHY Right 11/11/2022   Procedure: Lower Extremity Angiography;  Surgeon: [name], MD;  Location: [facility];  Service: Cardiovascular;  Laterality: Right;   LOWER EXTREMITY ANGIOGRAPHY Right 12/04/2023   Procedure: Lower Extremity Angiography;  Surgeon: [name], MD;  Location: [facility];  Service: Cardiovascular;  Laterality: Right;   LOWER EXTREMITY INTERVENTION Right 06/15/2021   Procedure: LOWER EXTREMITY INTERVENTION;  Surgeon: [name], MD;  Location: [facility];  Service: Cardiovascular;  Laterality: Right;   RIGHT/LEFT HEART CATH AND CORONARY ANGIOGRAPHY N/A 07/14/2023   Procedure: RIGHT/LEFT HEART CATH AND CORONARY ANGIOGRAPHY;  Surgeon: [name], MD;  Location: [facility];  Service: Cardiovascular;  Laterality: N/A;   TONSILLECTOMY     TRANSMETATARSAL AMPUTATION Right 02/17/2023   Procedure: TRANSMETATARSAL AMPUTATION;  Surgeon: Lennie Barter, [name];  Location: [facility];  Service: Orthopedics/Podiatry;  Laterality: Right;   WOUND DEBRIDEMENT Right 01/09/2023   Procedure: DEBRIDEMENT WOUND;  Surgeon: [name], MD;  Location: [facility];  Service: Vascular;  Laterality: Right;    Family History  Problem Relation Age of Onset   Scoliosis Mother    Heart disease  Father     No Known Allergies     Latest Ref Rng & Units 12/31/2023    3:11 AM 12/27/2023    1:54 PM 12/22/2023    5:19 AM  CBC  WBC 4.0 - 10.5 K/uL 9.6  8.0  6.9   Hemoglobin 13.0 - 17.0 g/dL 89.3  89.8  9.3   Hematocrit 39.0 - 52.0 % 32.6  31.1  28.6   Platelets 150 - 400 K/uL 293  226  217        CMP     Component Value Date/Time   NA 136 12/31/2023 1327   NA 137 07/21/2023 1246   NA 137 02/16/2012 2013   K 4.5 12/31/2023 1327   K 3.8 02/16/2012 2013   CL 103 12/31/2023 1327   CL 102 02/16/2012 2013   CO2 23 12/31/2023 1327   CO2 25 02/16/2012 2013   GLUCOSE 145 (H) 12/31/2023 1327   GLUCOSE 154 (H) 02/16/2012 2013   BUN 11 12/31/2023 1327   BUN 30 (H) 07/21/2023 1246   BUN 10 02/16/2012 2013   CREATININE 0.71 12/31/2023 1327   CREATININE 0.72 02/16/2012 2013   CALCIUM  8.4 (L) 12/31/2023 1327   CALCIUM  9.6 02/16/2012 2013   PROT 5.7 (L) 12/31/2023 1327   PROT 7.5 02/16/2012 2013   ALBUMIN 2.5 (L) 12/31/2023 1327   ALBUMIN 4.4 02/16/2012 2013   AST 17 12/31/2023 1327   AST 52 (H) 02/16/2012 2013   ALT 9 12/31/2023 1327   ALT 101 (H) 02/16/2012 2013   ALKPHOS 90 12/31/2023 1327   ALKPHOS 77 02/16/2012 2013   BILITOT 0.7 12/31/2023 1327   BILITOT 0.6 02/16/2012 2013   EGFR 70 07/21/2023 1246   GFRNONAA >60 12/31/2023 1327   GFRNONAA >60 02/16/2012 2013     No results found.     Assessment & Plan:   1. Peripheral arterial disease with history of revascularization (Primary) Patient with palpable pulses to left lower extremity . 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 Patient returns to clinic today for wound check. Distal end of the wound healing well. Proximal area of the wound is not healing well and remains deep with possible tunnel. Patient currently receiving IV Antibiotics long term 6 weeks through PICC line.   Patient was sent to the emergency room to be admitted to the hospital for further evaluation of the right groin wound and to continue IV antibiotics. Possible return to the operating room early next week for wound wash out and possible wound vac placement or wet to dry dressings. Infectious disease consulted to follow patient as he is admitted to the hospital.     2. Wound dehiscence Patient with right  groin post operative wound with wound vac. Unable to get wound vac to work properly at his rehab facility.    Current Outpatient Medications on File Prior to Visit  Medication Sig Dispense Refill   artificial tears ophthalmic solution Place 1 drop into both eyes as needed for dry eyes. 15 mL 0   aspirin  EC 81 MG tablet Take 1 tablet (81 mg total) by mouth daily. 150 tablet 2   atorvastatin  (LIPITOR ) 80 MG tablet Take 1 tablet (80 mg total) by mouth at bedtime. 90 tablet 3   carvedilol  (COREG ) 3.125 MG tablet Take 1 tablet (3.125 mg total) by mouth 2 (two) times daily with a meal.     clopidogrel  (PLAVIX ) 75 MG tablet Take 1 tablet (75 mg total) by  mouth daily. 90 tablet 3   dapagliflozin  propanediol (FARXIGA ) 10 MG TABS tablet Take 1 tablet (10 mg total) by mouth daily. 90 tablet 3   Ensure Max Protein (ENSURE MAX PROTEIN) LIQD Take 330 mLs (11 oz total) by mouth 2 (two) times daily. (Patient taking differently: Take 11 oz by mouth daily.) 10000 mL 3   ertapenem  (INVANZ ) IVPB Inject 1 g into the vein daily for 23 days. Indication:  Serratia bacteremia and infected vascular graft First Dose: Yes Last Day of Therapy:  01/22/2024 Labs - Once weekly:  CBC/D and CMP Fax weekly lab results  promptly to [phone] Method of administration: Mini-Bag Plus / Gravity Method of administration may be changed at the discretion of home infusion pharmacist based upon assessment of the patient and/or caregiver's ability to self-administer the medication ordered. Please leave PIC in place until doctor has seen patient or been notified Call [phone] with nay questions or critical values 24 Units 0   escitalopram  (LEXAPRO ) 10 MG tablet Take 1 tablet (10 mg total) by mouth daily.     ferrous sulfate  325 (65 FE) MG EC tablet Take 325 mg by mouth daily with breakfast.     fluticasone  (FLONASE ) 50 MCG/ACT nasal spray Place 1 spray into both nostrils 2 (two) times daily as needed for allergies or rhinitis.      folic acid  (FOLVITE ) 1 MG tablet Take 1 tablet (1 mg total) by mouth daily. 90 tablet 1   gabapentin  (NEURONTIN ) 100 MG capsule 1 po at bedtime. 90 capsule 3   insulin  aspart (NOVOLOG ) 100 UNIT/ML injection Inject 0-9 Units into the skin 3 (three) times daily with meals.     loratadine  (CLARITIN ) 10 MG tablet Take 1 tablet (10 mg total) by mouth daily as needed for allergies.     methocarbamol  (ROBAXIN ) 500 MG tablet Take 500 mg by mouth every 8 (eight) hours as needed for muscle spasms.     nitroGLYCERIN  (NITROSTAT ) 0.4 MG SL tablet Place 1 tablet (0.4 mg total) under the tongue every 5 (five) minutes as needed for chest pain. Max: 3 tablets per day     ondansetron  (ZOFRAN ) 4 MG tablet Take 1 tablet (4 mg total) by mouth every 6 (six) hours as needed for nausea.     pantoprazole  (PROTONIX ) 40 MG tablet Take 1 tablet (40 mg total) by mouth daily.     polyethylene glycol (MIRALAX  / GLYCOLAX ) 17 g packet Take 17 g by mouth daily.     sacubitril -valsartan  (ENTRESTO ) 24-26 MG Take 1 tablet by mouth every 12 (twelve) hours. 180 tablet 3   spironolactone  (ALDACTONE ) 25 MG tablet Take 1 tablet (25 mg total) by mouth daily. 90 tablet 3   thiamine  (VITAMIN B-1) 100 MG tablet Take 1 tablet (100 mg total) by mouth daily. 90 tablet 0   traZODone  (DESYREL ) 100 MG tablet Take 1 tablet (100 mg total) by mouth at bedtime.     No current facility-administered medications on file prior to visit.    There are no Patient Instructions on file for this visit. No follow-ups on file.   [name], NP

## 2024-01-17 ENCOUNTER — Observation Stay

## 2024-01-17 DIAGNOSIS — T148XXD Other injury of unspecified body region, subsequent encounter: Secondary | ICD-10-CM | POA: Diagnosis not present

## 2024-01-17 DIAGNOSIS — T8130XA Disruption of wound, unspecified, initial encounter: Secondary | ICD-10-CM | POA: Insufficient documentation

## 2024-01-17 DIAGNOSIS — Z792 Long term (current) use of antibiotics: Secondary | ICD-10-CM

## 2024-01-17 LAB — GLUCOSE, CAPILLARY
Glucose-Capillary: 182 mg/dL — ABNORMAL HIGH (ref 70–99)
Glucose-Capillary: 188 mg/dL — ABNORMAL HIGH (ref 70–99)

## 2024-01-17 LAB — CBG MONITORING, ED
Glucose-Capillary: 151 mg/dL — ABNORMAL HIGH (ref 70–99)
Glucose-Capillary: 134 mg/dL — ABNORMAL HIGH (ref 70–99)

## 2024-01-17 MED ORDER — CARVEDILOL 3.125 MG PO TABS
3.1250 mg | ORAL_TABLET | Freq: Two times a day (BID) | ORAL | Status: DC
  Administered 2024-01-18 – 2024-01-26 (×17): 3.125 mg via ORAL
  Filled 2024-01-17 (×17): qty 1

## 2024-01-17 MED ORDER — INSULIN ASPART 100 UNIT/ML IJ SOLN
0.0000 [IU] | Freq: Three times a day (TID) | INTRAMUSCULAR | Status: DC
  Administered 2024-01-17: 2 [IU] via SUBCUTANEOUS
  Administered 2024-01-17 – 2024-01-18 (×3): 3 [IU] via SUBCUTANEOUS
  Administered 2024-01-18 – 2024-01-19 (×2): 2 [IU] via SUBCUTANEOUS
  Administered 2024-01-19: 3 [IU] via SUBCUTANEOUS
  Administered 2024-01-19: 2 [IU] via SUBCUTANEOUS
  Administered 2024-01-20: 5 [IU] via SUBCUTANEOUS
  Administered 2024-01-21 (×2): 2 [IU] via SUBCUTANEOUS
  Administered 2024-01-21: 3 [IU] via SUBCUTANEOUS
  Administered 2024-01-22: 2 [IU] via SUBCUTANEOUS
  Administered 2024-01-22 (×2): 3 [IU] via SUBCUTANEOUS
  Administered 2024-01-23 (×2): 2 [IU] via SUBCUTANEOUS
  Administered 2024-01-23: 5 [IU] via SUBCUTANEOUS
  Administered 2024-01-24: 3 [IU] via SUBCUTANEOUS
  Administered 2024-01-24 (×2): 2 [IU] via SUBCUTANEOUS
  Administered 2024-01-25: 3 [IU] via SUBCUTANEOUS
  Administered 2024-01-25: 2 [IU] via SUBCUTANEOUS
  Administered 2024-01-25 – 2024-01-26 (×2): 3 [IU] via SUBCUTANEOUS
  Administered 2024-01-26: 2 [IU] via SUBCUTANEOUS
  Filled 2024-01-17 (×26): qty 1

## 2024-01-17 MED ORDER — NITROGLYCERIN 0.4 MG SL SUBL: 0.4000 mg | SUBLINGUAL_TABLET | SUBLINGUAL | Status: DC | PRN

## 2024-01-17 MED ORDER — ONDANSETRON HCL 4 MG PO TABS: 4.0000 mg | ORAL_TABLET | Freq: Four times a day (QID) | ORAL | Status: DC | PRN

## 2024-01-17 MED ORDER — POLYVINYL ALCOHOL 1.4 % OP SOLN
1.0000 [drp] | OPHTHALMIC | Status: DC | PRN
  Administered 2024-01-19 – 2024-01-22 (×2): 1 [drp] via OPHTHALMIC
  Filled 2024-01-17: qty 15

## 2024-01-17 MED ORDER — HYDROCODONE-ACETAMINOPHEN 5-325 MG PO TABS
1.0000 | ORAL_TABLET | ORAL | Status: DC | PRN
  Administered 2024-01-17 (×2): 1 via ORAL
  Administered 2024-01-18 – 2024-01-25 (×8): 2 via ORAL
  Filled 2024-01-17 (×2): qty 2
  Filled 2024-01-17: qty 1
  Filled 2024-01-17: qty 2
  Filled 2024-01-17: qty 1
  Filled 2024-01-17 (×5): qty 2

## 2024-01-17 MED ORDER — SPIRONOLACTONE 25 MG PO TABS
25.0000 mg | ORAL_TABLET | Freq: Every day | ORAL | Status: DC
  Administered 2024-01-18 – 2024-01-26 (×9): 25 mg via ORAL
  Filled 2024-01-17 (×9): qty 1

## 2024-01-17 MED ORDER — TRAZODONE HCL 100 MG PO TABS
100.0000 mg | ORAL_TABLET | Freq: Every day | ORAL | Status: DC
Start: 2024-01-17 — End: 2024-01-26
  Administered 2024-01-17 – 2024-01-25 (×10): 100 mg via ORAL
  Filled 2024-01-17 (×10): qty 1

## 2024-01-17 MED ORDER — SODIUM CHLORIDE 0.9 % IV BOLUS
250.0000 mL | Freq: Once | INTRAVENOUS | Status: AC
  Administered 2024-01-17: 250 mL via INTRAVENOUS

## 2024-01-17 MED ORDER — ERTAPENEM IV (FOR PTA / DISCHARGE USE ONLY): 1.0000 g | INTRAVENOUS | Status: DC

## 2024-01-17 MED ORDER — THIAMINE MONONITRATE 100 MG PO TABS
100.0000 mg | ORAL_TABLET | Freq: Every day | ORAL | Status: DC
  Administered 2024-01-17 – 2024-01-26 (×10): 100 mg via ORAL
  Filled 2024-01-17 (×10): qty 1

## 2024-01-17 MED ORDER — INSULIN ASPART 100 UNIT/ML IJ SOLN: 0.0000 [IU] | Freq: Every day | INTRAMUSCULAR | Status: DC

## 2024-01-17 MED ORDER — ESCITALOPRAM OXALATE 10 MG PO TABS
10.0000 mg | ORAL_TABLET | Freq: Every day | ORAL | Status: DC
  Administered 2024-01-17 – 2024-01-26 (×10): 10 mg via ORAL
  Filled 2024-01-17 (×10): qty 1

## 2024-01-17 MED ORDER — DAPAGLIFLOZIN PROPANEDIOL 10 MG PO TABS
10.0000 mg | ORAL_TABLET | Freq: Every day | ORAL | Status: DC
Start: 2024-01-17 — End: 2024-01-17
  Filled 2024-01-17: qty 1

## 2024-01-17 MED ORDER — SODIUM CHLORIDE 0.9% FLUSH: 10.0000 mL | INTRAVENOUS | Status: DC | PRN

## 2024-01-17 MED ORDER — ASPIRIN 81 MG PO TBEC
81.0000 mg | DELAYED_RELEASE_TABLET | Freq: Every day | ORAL | Status: DC
  Administered 2024-01-17 – 2024-01-26 (×10): 81 mg via ORAL
  Filled 2024-01-17 (×10): qty 1

## 2024-01-17 MED ORDER — POLYETHYLENE GLYCOL 3350 17 G PO PACK
17.0000 g | PACK | Freq: Every day | ORAL | Status: DC
  Administered 2024-01-17 – 2024-01-26 (×8): 17 g via ORAL
  Filled 2024-01-17 (×10): qty 1

## 2024-01-17 MED ORDER — SPIRONOLACTONE 25 MG PO TABS
25.0000 mg | ORAL_TABLET | Freq: Every day | ORAL | Status: DC
Start: 2024-01-17 — End: 2024-01-17
  Filled 2024-01-17: qty 1

## 2024-01-17 MED ORDER — CARVEDILOL 3.125 MG PO TABS
3.1250 mg | ORAL_TABLET | Freq: Two times a day (BID) | ORAL | Status: DC
Start: 2024-01-17 — End: 2024-01-17
  Filled 2024-01-17: qty 1

## 2024-01-17 MED ORDER — ENOXAPARIN SODIUM 60 MG/0.6ML IJ SOSY
0.5000 mg/kg | PREFILLED_SYRINGE | INTRAMUSCULAR | Status: DC
  Administered 2024-01-17 – 2024-01-26 (×10): 45 mg via SUBCUTANEOUS
  Filled 2024-01-17 (×10): qty 0.6

## 2024-01-17 MED ORDER — ALBUTEROL SULFATE (2.5 MG/3ML) 0.083% IN NEBU: 2.5000 mg | INHALATION_SOLUTION | RESPIRATORY_TRACT | Status: DC | PRN

## 2024-01-17 MED ORDER — CLOPIDOGREL BISULFATE 75 MG PO TABS
75.0000 mg | ORAL_TABLET | Freq: Every day | ORAL | Status: DC
  Administered 2024-01-17 – 2024-01-26 (×10): 75 mg via ORAL
  Filled 2024-01-17 (×10): qty 1

## 2024-01-17 MED ORDER — MORPHINE SULFATE (PF) 2 MG/ML IV SOLN: 2.0000 mg | INTRAVENOUS | Status: DC | PRN

## 2024-01-17 MED ORDER — FERROUS SULFATE 325 (65 FE) MG PO TABS
325.0000 mg | ORAL_TABLET | Freq: Every day | ORAL | Status: DC
Start: 2024-01-17 — End: 2024-01-26
  Administered 2024-01-17 – 2024-01-26 (×10): 325 mg via ORAL
  Filled 2024-01-17 (×10): qty 1

## 2024-01-17 MED ORDER — DAPAGLIFLOZIN PROPANEDIOL 10 MG PO TABS
10.0000 mg | ORAL_TABLET | Freq: Every day | ORAL | Status: DC
  Administered 2024-01-18 – 2024-01-26 (×9): 10 mg via ORAL
  Filled 2024-01-17 (×9): qty 1

## 2024-01-17 MED ORDER — MORPHINE SULFATE (PF) 4 MG/ML IV SOLN
4.0000 mg | Freq: Once | INTRAVENOUS | Status: AC
  Administered 2024-01-17: 4 mg via INTRAVENOUS
  Filled 2024-01-17: qty 1

## 2024-01-17 MED ORDER — LORATADINE 10 MG PO TABS: 10.0000 mg | ORAL_TABLET | Freq: Every day | ORAL | Status: DC | PRN

## 2024-01-17 MED ORDER — ATORVASTATIN CALCIUM 80 MG PO TABS
80.0000 mg | ORAL_TABLET | Freq: Every day | ORAL | Status: DC
  Administered 2024-01-17 – 2024-01-25 (×10): 80 mg via ORAL
  Filled 2024-01-17 (×7): qty 1
  Filled 2024-01-17: qty 4
  Filled 2024-01-17 (×2): qty 1

## 2024-01-17 MED ORDER — CHLORHEXIDINE GLUCONATE CLOTH 2 % EX PADS
6.0000 | MEDICATED_PAD | Freq: Every day | CUTANEOUS | Status: DC
  Administered 2024-01-17 – 2024-01-26 (×10): 6 via TOPICAL

## 2024-01-17 MED ORDER — ACETAMINOPHEN 650 MG RE SUPP: 650.0000 mg | Freq: Four times a day (QID) | RECTAL | Status: DC | PRN

## 2024-01-17 MED ORDER — SACUBITRIL-VALSARTAN 24-26 MG PO TABS
1.0000 | ORAL_TABLET | Freq: Two times a day (BID) | ORAL | Status: DC
Start: 2024-01-18 — End: 2024-01-26
  Administered 2024-01-18 – 2024-01-26 (×16): 1 via ORAL
  Filled 2024-01-17 (×17): qty 1

## 2024-01-17 MED ORDER — SODIUM CHLORIDE 0.9% FLUSH
10.0000 mL | Freq: Two times a day (BID) | INTRAVENOUS | Status: DC
  Administered 2024-01-18 – 2024-01-26 (×18): 10 mL

## 2024-01-17 MED ORDER — FOLIC ACID 1 MG PO TABS
1.0000 mg | ORAL_TABLET | Freq: Every day | ORAL | Status: DC
  Administered 2024-01-17 – 2024-01-26 (×10): 1 mg via ORAL
  Filled 2024-01-17 (×10): qty 1

## 2024-01-17 MED ORDER — SODIUM CHLORIDE 0.9 % IV SOLN
1.0000 g | INTRAVENOUS | Status: DC
  Administered 2024-01-17 – 2024-01-26 (×10): 1 g via INTRAVENOUS
  Filled 2024-01-17 (×3): qty 1000
  Filled 2024-01-17: qty 1
  Filled 2024-01-17 (×6): qty 1000

## 2024-01-17 MED ORDER — ACETAMINOPHEN 325 MG PO TABS: 650.0000 mg | ORAL_TABLET | Freq: Four times a day (QID) | ORAL | Status: DC | PRN

## 2024-01-17 MED ORDER — PANTOPRAZOLE SODIUM 40 MG PO TBEC
40.0000 mg | DELAYED_RELEASE_TABLET | Freq: Every day | ORAL | Status: DC
  Administered 2024-01-17 – 2024-01-26 (×10): 40 mg via ORAL
  Filled 2024-01-17 (×10): qty 1

## 2024-01-17 MED ORDER — SACUBITRIL-VALSARTAN 24-26 MG PO TABS
1.0000 | ORAL_TABLET | Freq: Two times a day (BID) | ORAL | Status: DC
  Filled 2024-01-17: qty 1

## 2024-01-17 MED ORDER — ONDANSETRON HCL 4 MG/2ML IJ SOLN: 4.0000 mg | Freq: Four times a day (QID) | INTRAMUSCULAR | Status: DC | PRN

## 2024-01-17 NOTE — Progress Notes (Signed)
 Anticoagulation monitoring(Lovenox ):  78 yo male ordered Lovenox  40 mg Q24h    Filed Weights   01/16/24 1546  Weight: 90.7 kg (200 lb)   BMI 30.4   Lab Results  Component Value Date   CREATININE 0.81 01/16/2024   CREATININE 0.71 12/31/2023   CREATININE 0.93 12/27/2023   Estimated Creatinine Clearance: 79.5 mL/min (by C-G formula based on SCr of 0.81 mg/dL). Hemoglobin & Hematocrit     Component Value Date/Time   HGB 12.3 (L) 01/16/2024 1600   HGB 15.7 02/16/2012 2013   HCT 38.3 (L) 01/16/2024 1600   HCT 44.4 02/16/2012 2013     Per Protocol for Patient with estCrcl > 30 ml/min and BMI > 30, will transition to Lovenox  45 mg Q24h.

## 2024-01-17 NOTE — Assessment & Plan Note (Signed)
 Blood glucose elevated to 281 Continue home basal insulin  with sliding scale coverage

## 2024-01-17 NOTE — ED Notes (Signed)
 OR team present at the bedside to assist with wound vac set up.

## 2024-01-17 NOTE — Assessment & Plan Note (Addendum)
 Serratia wound infection Wound dehiscence right groin s/p repair of posterior and vascular stent/femoral artery rupture Long-term antibiotics-via PICC Vascular consulted for further orders Continue antibiotics-ertapenem  Pain control

## 2024-01-17 NOTE — Progress Notes (Signed)
 [facility] Progress Note  Patient: Manuel [name]    FMW:[id number]  DOA: 01/16/2024     Date of Service: the patient was seen and examined on 01/17/2024  Chief Complaint  Patient presents with   Wound Check    Wound vac unable to get a seal and is not working properly    Brief hospital course: TAVORIS BRISK is a 78 y.o. male with medical history significant for DM, HTN, OSA,  HFrEF 25-30%,  CAD s/p multiple stents, extensive PAD/carotid artery stenosis with history of revascularization, most recently stent placement right femoral artery complicated by femoral artery rupture/repair/wound dehiscence/serratia infection currently on wound VAC and ertapenem  via PICC, sent in from vascular clinic for wound VAC change due to delayed healing and difficulty maintaining a seal at his facility.  Per review of outpatient note, he is to return to the OR early next week for wound washout .  Patient has pain in the area but otherwise denies complaints In the ED, vitals unremarkable.  Labs unremarkable except for elevated blood sugar of 281. Patient treated with morphine  for pain. Admission requested    Assessment and Plan:  # Delayed wound healing # Serratia wound infection # Wound dehiscence right groin s/p repair of posterior and vascular stent/femoral artery rupture # Long-term antibiotics-via PICC Vascular surgery consulted, recommended washout next week and continue wound VAC for now till Tuesday. Continue antibiotics-ertapenem  Pain control   # Chronic systolic CHF (congestive heart failure) (HCC) EF 20 to 25% History of NSVT Clinically euvolemic Continue home GDMT of carvedilol , spironolactone , Entresto  and Farxiga  9/27 hypotensive episode, NS to 50 mL bolus given, BP is stable now Monitor BP and titrate medications accordingly   # Uncontrolled type 2 diabetes mellitus with hyperglycemia, with long-term current use of insulin  (HCC) Blood glucose elevated to 281 Continue home  basal insulin  with sliding scale coverage     # History of alcohol  abuse On thiamine    # Sleep apnea with nocturnal hypoxia Prn O2    Body mass index is 30.41 kg/m.  Interventions:   Diet: Carb modified/heart rate with diet DVT Prophylaxis: Subcutaneous Lovenox    Advance goals of care discussion: DNR-limited  Family Communication: family was not present at bedside, at the time of interview.  The pt provided permission to discuss medical plan with the family. Opportunity was given to ask question and all questions were answered satisfactorily.   Disposition:  Pt is from SNF, admitted with nonhealing wound and SNF unable to manage wound VAC, patient needs surgical intervention next week, which precludes a safe discharge. Discharge to SNF, when cleared by vascular surgery most likely in few days.  Subjective: No significant events overnight.  Patient just feels tired, denied any pain. As per patient wound VAC was fixed last night, working well now, no any other active issues.  Physical Exam: General: NAD, lying comfortably Appear in no distress, affect appropriate Eyes: PERRLA ENT: Oral Mucosa Clear, moist  Neck: no JVD,  Cardiovascular: S1 and S2 Present, no Murmur,  Respiratory: good respiratory effort, Bilateral Air entry equal and Decreased, no Crackles, no wheezes Abdomen: Bowel Sound present, Soft and no tenderness,  Skin: no rashes Extremities: Right groin wound VAC attached, no Pedal edema, no calf tenderness Neurologic: without any new focal findings Gait not checked due to patient safety concerns  Vitals:   01/17/24 0235 01/17/24 0548 01/17/24 0941 01/17/24 1112  BP: (!) 143/70 109/82 (!) 83/52 (!) 149/75  Pulse: 71 67 69   Resp:  18 18   Temp: 97.9 F (36.6 C) 97.7 F (36.5 C) 97.6 F (36.4 C)   TempSrc: Oral Oral Oral   SpO2: 98% 98% 100%   Weight:      Height:        Intake/Output Summary (Last 24 hours) at 01/17/2024 1503 Last data filed at  01/16/2024 2029 Gross per 24 hour  Intake --  Output 180 ml  Net -180 ml   Filed Weights   01/16/24 1546  Weight: 90.7 kg    Data Reviewed: I have personally reviewed and interpreted daily labs, tele strips, imagings as discussed above. I reviewed all nursing notes, pharmacy notes, vitals, pertinent old records I have discussed plan of care as described above with RN and patient/family.  CBC: Recent Labs  Lab 01/16/24 1600  WBC 9.5  HGB 12.3*  HCT 38.3*  MCV 87.8  PLT 296   Basic Metabolic Panel: Recent Labs  Lab 01/16/24 1600  NA 133*  K 4.1  CL 98  CO2 23  GLUCOSE 281*  BUN 29*  CREATININE 0.81  CALCIUM  8.7*    Studies: No results found.  Scheduled Meds:  aspirin  EC  81 mg Oral Daily   atorvastatin   80 mg Oral QHS   [START ON 01/18/2024] carvedilol   3.125 mg Oral BID WC   clopidogrel   75 mg Oral Daily   [START ON 01/18/2024] dapagliflozin  propanediol  10 mg Oral Daily   enoxaparin  (LOVENOX ) injection  0.5 mg/kg Subcutaneous Q24H   escitalopram   10 mg Oral Daily   ferrous sulfate   325 mg Oral Q breakfast   folic acid   1 mg Oral Daily   insulin  aspart  0-15 Units Subcutaneous TID WC   insulin  aspart  0-5 Units Subcutaneous QHS   pantoprazole   40 mg Oral Daily   polyethylene glycol  17 g Oral Daily   [START ON 01/18/2024] sacubitril -valsartan   1 tablet Oral Q12H   [START ON 01/18/2024] spironolactone   25 mg Oral Daily   thiamine   100 mg Oral Daily   traZODone   100 mg Oral QHS   Continuous Infusions:  ertapenem  Stopped (01/17/24 1056)   PRN Meds: acetaminophen  **OR** acetaminophen , albuterol , artificial tears, HYDROcodone -acetaminophen , loratadine , morphine  injection, nitroGLYCERIN , ondansetron  **OR** ondansetron  (ZOFRAN ) IV  Time spent: 55 minutes  Author: ELVAN SOR. MD [facility] 01/17/2024 3:03 PM  To reach On-call, see care teams to locate the attending and reach out to them via [email or web address]. If 7PM-7AM, please contact  night-coverage If you still have difficulty reaching the attending provider, please page the [facility] (Director on Call) for [facility] on amion for assistance.

## 2024-01-17 NOTE — ED Notes (Signed)
 This EDT assisted Pt to and from BR. Pt stated when back in room that he did not want to get back in bed and would rather stay in personal wheelchair.

## 2024-01-17 NOTE — H&P (Signed)
 History and Physical    Patient: Manuel [name] FMW:[id number] DOB: 31-Jul-1945 DOA: 01/16/2024 DOS: the patient was seen and examined on 01/17/2024 PCP: [name], [name], MD  Patient coming from: SNF  Chief Complaint:  Chief Complaint  Patient presents with   Wound Check    Wound vac unable to get a seal and is not working properly     HPI: Manuel [name] is a 78 y.o. male with medical history significant for DM, HTN, OSA,  HFrEF 25-30%,  CAD s/p multiple stents, extensive PAD/carotid artery stenosis with history of revascularization, most recently stent placement right femoral artery complicated by femoral artery rupture/repair/wound dehiscence/serratia infection currently on wound VAC and ertapenem  via PICC, sent in from vascular clinic for wound VAC change due to delayed healing and difficulty maintaining a seal at his facility.  Per review of outpatient note, he is to return to the OR early next week for wound washout .  Patient has pain in the area but otherwise denies complaints In the ED, vitals unremarkable.  Labs unremarkable except for elevated blood sugar of 281. Patient treated with morphine  for pain. Admission requested     Past Medical History:  Diagnosis Date   Acute ST elevation myocardial infarction (STEMI) of inferior wall (HCC) 04/19/2010   a.) trans[facility] from [facility] to [facility] --> LHC/PCI (very difficult procedure) --> 3.0 x 23 mm and 3.0 x 12 mm Xience stents to RCA   Allergies    Arthritis    Benign essential hypertension    Bilateral carotid artery disease 05/08/2021   a.) carotid doppler 05/08/2021: 1-39% BICA   CAD (coronary artery disease) 04/19/2010   a.) inferior STEMI 04/19/2010 --> LHC/PCI: 50-70% pD1, 80% pRI, 90/90/90% RCA (overlapping 3.0 x 23 and 3.0 x 12 mm Xience DES); b.) MV 11/10/2018: fixed minimally reversible inferior perfusion defect   Cellulitis of foot    Chronic HFrEF (heart failure with reduced ejection fraction) (HCC)    DDD (degenerative  disc disease), cervical    Diabetes mellitus type 2, insulin  dependent (HCC)    Diverticulosis    Full dentures    Gout    Hard of hearing    History of bilateral cataract extraction 2022   History of ETOH abuse    Hyperlipidemia    Ischemic cardiomyopathy 04/19/2010   a.) TTE 04/19/2010: 40%; b.) TTE 04/20/2014: EF >55%; c.) TTE 11/10/2018: EF 45%; d.) TTE 11/14/2022: EF 25-30%; e. 06/2023 Echo: EF 25-30%, nl RV size/fxn. Mild MR. Mild-mod TR. Ao sclerosis w/o stenosis.   Long term current use of aspirin     Long term current use of clopidogrel     Lumbar degenerative disc disease    Lumbar radiculopathy    Lumbar vertebral fracture (chronic superior endplate of L1)    NSTEMI (non-ST elevated myocardial infarction) (HCC) 01/15/2023   OSA (obstructive sleep apnea)    a.) unable to tolerate nocturnal PAP therapy   Peripheral artery disease    a.) stenting 05/14/21: 12 mm x 12 cm LifeStent RIGHT dis SFA/prox pop; b.) s/p cath directed thrombolysis RIGHT SFA/pop 06/14/21; c.) s/p mech thrombectomy + stenting 06/15/21: 8 mm x 25cm & 8 mm x 7.5cm Viabahn; d.) s/p BILAT CFA, profunda femoris, SFA endarterectomies + fogarty embolectomy + stenting 11/15/22: 12mm x 58mm Lifestream BILAT CIAs, 14 mm x 6 cm Lifestream & 13 mm x 5 cm Viabahn LEFT EIA   Peripheral neuropathy    Umbilical hernia    Past Surgical History:  Procedure Laterality Date  AMPUTATION Right 11/18/2022   Procedure: AMPUTATION 4TH AND 5TH RAY;  Surgeon: [name], [name];  Location: [facility];  Service: Orthopedics/Podiatry;  Laterality: Right;  4th and 5th toe   AMPUTATION Right 04/20/2023   Procedure: AMPUTATION BELOW KNEE;  Surgeon: [name], MD;  Location: [facility];  Service: Vascular;  Laterality: Right;  block then general   APPLICATION OF WOUND VAC Right 01/09/2023   Procedure: APPLICATION OF WOUND VAC;  Surgeon: [name], MD;  Location: [facility];  Service: Vascular;  Laterality: Right;   APPLICATION OF WOUND VAC  Right 12/12/2023   Procedure: APPLICATION, WOUND VAC;  Surgeon: [name], MD;  Location: [facility];  Service: Vascular;  Laterality: Right;   APPLICATION OF WOUND VAC Right 12/17/2023   Procedure: APPLICATION, WOUND VAC;  Surgeon: [name], MD;  Location: [facility];  Service: Vascular;  Laterality: Right;  EXCHANGE   APPLICATION OF WOUND VAC Right 12/23/2023   Procedure: APPLICATION, WOUND VAC;  Surgeon: [name], MD;  Location: [facility];  Service: General;  Laterality: Right;   APPLICATION OF WOUND VAC Right 12/11/2023   Procedure: APPLICATION, WOUND VAC;  Surgeon: [name], MD;  Location: [facility];  Service: Vascular;  Laterality: Right;   CATARACT EXTRACTION W/PHACO Right 03/14/2021   Procedure: CATARACT EXTRACTION PHACO AND INTRAOCULAR LENS PLACEMENT (IOC) RIGHT DIABETIC;  Surgeon: [name], MD;  Location: [facility];  Service: Ophthalmology;  Laterality: Right;  Diabetic 16.78 01:39.9   CATARACT EXTRACTION W/PHACO Left 03/28/2021   Procedure: CATARACT EXTRACTION PHACO AND INTRAOCULAR LENS PLACEMENT (IOC) LEFT DIABETIC 6.93 01:22.0;  Surgeon: [name], MD;  Location: [facility];  Service: Ophthalmology;  Laterality: Left;  Diabetic   COLONOSCOPY     CORONARY ANGIOPLASTY WITH STENT PLACEMENT  03/2010   Procedure: CORONARY ANGIOPLASTY WITH STENT PLACEMENT; Location: [name]   CORONARY STENT INTERVENTION N/A 07/14/2023   Procedure: CORONARY STENT INTERVENTION;  Surgeon: [name], MD;  Location: [facility];  Service: Cardiovascular;  Laterality: N/A;   ENDARTERECTOMY FEMORAL Bilateral 11/15/2022   Procedure: BILATERAL COMMON FEMORAL PROFUNDA FEMORIS AND SUPERFICIAL FEMORAL ARTERY ENDARTECTOMIES, RIGHT FOGARTY EMBOLECTOMY OF THE RIGHT SFA  AND  POPLITEAL ARTERIES. EZELLA AND RIGHT LOWER EXTREMITY ANGIOGRAM.;  Surgeon: [name], MD;  Location: [facility];  Service: Vascular;  Laterality: Bilateral;   ENDARTERECTOMY  FEMORAL Right 12/11/2023   Procedure: ENDARTERECTOMY, FEMORAL;  Surgeon: [name], MD;  Location: [facility];  Service: Vascular;  Laterality: Right;  Re-do of femoral endarterectomy with sartorius flap   FEMORAL ARTERY EXPLORATION Right 12/12/2023   Procedure: EXPLORATION, ARTERY, FEMORAL- stop bleeding with suture repair anastomaosis , abx beads;  Surgeon: [name], MD;  Location: [facility];  Service: Vascular;  Laterality: Right;   GROIN DEBRIDEMENT Right 12/28/2023   Procedure: DEBRIDEMENT, INGUINAL REGION;  Surgeon: Elnor Norleen CROME, MD;  Location: [facility];  Service: Vascular;  Laterality: Right;   HEMATOMA EVACUATION N/A 12/12/2023   Procedure: EVACUATION HEMATOMA;  Surgeon: [name], MD;  Location: [facility];  Service: Vascular;  Laterality: N/A;   INSERTION OF ILIAC STENT Bilateral 11/15/2022   Procedure: BILATERAL STENT INSERTION IN BILATERAL  COMMON ILIAC ARTERY, STENT INSERTION OF LEFT EXTERNAL ILIAC ARTERY. ANGIOPLASTY RIGHT TIBIAL  AND POPLITEAL ARTERY.;  Surgeon: [name], MD;  Location: [facility];  Service: Vascular;  Laterality: Bilateral;   IRRIGATION AND DEBRIDEMENT KNEE Right 04/21/2023   Procedure: IRRIGATION AND DEBRIDEMENT KNEE;  Surgeon:  Zafonte, Redell Ned, MD;  Location: [facility];  Service: Orthopedics;  Laterality: Right;   LOWER EXTREMITY ANGIOGRAPHY Right 05/14/2021   Procedure: LOWER EXTREMITY ANGIOGRAPHY;  Surgeon: [name], MD;  Location: [facility];  Service: Cardiovascular;  Laterality: Right;   LOWER EXTREMITY ANGIOGRAPHY Right 06/14/2021   Procedure: Lower Extremity Angiography;  Surgeon: [name], MD;  Location: [facility];  Service: Cardiovascular;  Laterality: Right;   LOWER EXTREMITY ANGIOGRAPHY Right 11/11/2022   Procedure: Lower Extremity Angiography;  Surgeon: [name], MD;  Location: [facility];  Service: Cardiovascular;  Laterality: Right;   LOWER EXTREMITY ANGIOGRAPHY Right 12/04/2023    Procedure: Lower Extremity Angiography;  Surgeon: [name], MD;  Location: [facility];  Service: Cardiovascular;  Laterality: Right;   LOWER EXTREMITY INTERVENTION Right 06/15/2021   Procedure: LOWER EXTREMITY INTERVENTION;  Surgeon: [name], MD;  Location: [facility];  Service: Cardiovascular;  Laterality: Right;   RIGHT/LEFT HEART CATH AND CORONARY ANGIOGRAPHY N/A 07/14/2023   Procedure: RIGHT/LEFT HEART CATH AND CORONARY ANGIOGRAPHY;  Surgeon: [name], MD;  Location: [facility];  Service: Cardiovascular;  Laterality: N/A;   TONSILLECTOMY     TRANSMETATARSAL AMPUTATION Right 02/17/2023   Procedure: TRANSMETATARSAL AMPUTATION;  Surgeon: Lennie Barter, [name];  Location: [facility];  Service: Orthopedics/Podiatry;  Laterality: Right;   WOUND DEBRIDEMENT Right 01/09/2023   Procedure: DEBRIDEMENT WOUND;  Surgeon: [name], MD;  Location: [facility];  Service: Vascular;  Laterality: Right;   Social History:  reports that he has never smoked. He has never been exposed to tobacco smoke. He has never used smokeless tobacco. He reports current alcohol  use of about 21.0 standard drinks of alcohol  per week. He reports that he does not use drugs.  No Known Allergies  Family History  Problem Relation Age of Onset   Scoliosis Mother    Heart disease Father     Prior to Admission medications   Medication Sig Start Date End Date Taking? Authorizing Provider  artificial tears ophthalmic solution Place 1 drop into both eyes as needed for dry eyes. 12/26/23   Patel, Sona, MD  aspirin  EC 81 MG tablet Take 1 tablet (81 mg total) by mouth daily. 11/26/22   Maree Hue, MD  atorvastatin  (LIPITOR ) 80 MG tablet Take 1 tablet (80 mg total) by mouth at bedtime. 11/18/23   Gollan, Timothy J, MD  carvedilol  (COREG ) 3.125 MG tablet Take 1 tablet (3.125 mg total) by mouth 2 (two) times daily with a meal. 01/01/24   Lenon Marien CROME, MD  clopidogrel  (PLAVIX ) 75 MG tablet  Take 1 tablet (75 mg total) by mouth daily. 11/18/23   Gollan, Timothy J, MD  dapagliflozin  propanediol (FARXIGA ) 10 MG TABS tablet Take 1 tablet (10 mg total) by mouth daily. 11/18/23   Gollan, Timothy J, MD  Ensure Max Protein (ENSURE MAX PROTEIN) LIQD Take 330 mLs (11 oz total) by mouth 2 (two) times daily. Patient taking differently: Take 11 oz by mouth daily. 11/26/22   Maree Hue, MD  ertapenem  (INVANZ ) IVPB Inject 1 g into the vein daily for 23 days. Indication:  Serratia bacteremia and infected vascular graft First Dose: Yes Last Day of Therapy:  01/22/2024 Labs - Once weekly:  CBC/D and CMP Fax weekly lab results  promptly to [phone] Method of administration: Mini-Bag Plus / Gravity Method of administration may be changed at the discretion of home infusion pharmacist based upon  assessment of the patient and/or caregiver's ability to self-administer the medication ordered. Please leave PIC in place until doctor has seen patient or been notified Call [phone] with nay questions or critical values 12/30/23 01/22/24  Tobie Calix, MD  escitalopram  (LEXAPRO ) 10 MG tablet Take 1 tablet (10 mg total) by mouth daily. 01/02/24   Lenon Marien CROME, MD  ferrous sulfate  325 (65 FE) MG EC tablet Take 325 mg by mouth daily with breakfast.    [provider]  fluticasone  (FLONASE ) 50 MCG/ACT nasal spray Place 1 spray into both nostrils 2 (two) times daily as needed for allergies or rhinitis. 01/01/24   Lenon Marien CROME, MD  folic acid  (FOLVITE ) 1 MG tablet Take 1 tablet (1 mg total) by mouth daily. 09/12/23   [name], [name], MD  gabapentin  (NEURONTIN ) 100 MG capsule 1 po at bedtime. 10/30/23   [name], [name], MD  insulin  aspart (NOVOLOG ) 100 UNIT/ML injection Inject 0-9 Units into the skin 3 (three) times daily with meals. 01/01/24   Lenon Marien CROME, MD  loratadine  (CLARITIN ) 10 MG tablet Take 1 tablet (10 mg total) by mouth daily as needed for allergies. 01/01/24   Lenon Marien CROME,  MD  methocarbamol  (ROBAXIN ) 500 MG tablet Take 500 mg by mouth every 8 (eight) hours as needed for muscle spasms.    [provider]  nitroGLYCERIN  (NITROSTAT ) 0.4 MG SL tablet Place 1 tablet (0.4 mg total) under the tongue every 5 (five) minutes as needed for chest pain. Max: 3 tablets per day 01/01/24 12/31/24  Lenon Marien CROME, MD  ondansetron  (ZOFRAN ) 4 MG tablet Take 1 tablet (4 mg total) by mouth every 6 (six) hours as needed for nausea. 05/08/23   Laurence Locus, DO  pantoprazole  (PROTONIX ) 40 MG tablet Take 1 tablet (40 mg total) by mouth daily. 01/01/24   Lenon Marien CROME, MD  polyethylene glycol (MIRALAX  / GLYCOLAX ) 17 g packet Take 17 g by mouth daily. 01/01/24   Lenon Marien CROME, MD  sacubitril -valsartan  (ENTRESTO ) 24-26 MG Take 1 tablet by mouth every 12 (twelve) hours. 11/18/23 11/17/24  Gollan, Timothy J, MD  spironolactone  (ALDACTONE ) 25 MG tablet Take 1 tablet (25 mg total) by mouth daily. 11/18/23   Gollan, Timothy J, MD  thiamine  (VITAMIN B-1) 100 MG tablet Take 1 tablet (100 mg total) by mouth daily. 02/20/23   Amin, Sumayya, MD  traZODone  (DESYREL ) 100 MG tablet Take 1 tablet (100 mg total) by mouth at bedtime. 01/01/24   Lenon Marien CROME, MD    Physical Exam: Vitals:   01/16/24 2230 01/16/24 2300 01/16/24 2330 01/17/24 0000  BP: 139/75 (!) 145/99 (!) 162/87 (!) 142/75  Pulse: 69 71 79 70  Resp:   19   Temp:   98.2 F (36.8 C)   TempSrc:   Oral   SpO2: 95% 98% 93% 96%  Weight:      Height:       Physical Exam Vitals and nursing note reviewed.  Constitutional:      General: He is not in acute distress. HENT:     Head: Normocephalic and atraumatic.  Cardiovascular:     Rate and Rhythm: Normal rate and regular rhythm.     Heart sounds: Normal heart sounds.  Pulmonary:     Effort: Pulmonary effort is normal.     Breath sounds: Normal breath sounds.  Abdominal:     Palpations: Abdomen is soft.     Tenderness: There is no abdominal tenderness.   Musculoskeletal:  Comments: Right BKA Wound VAC right groin  Neurological:     Mental Status: Mental status is at baseline.     Labs on Admission: I have personally reviewed following labs and imaging studies  CBC: Recent Labs  Lab 01/16/24 1600  WBC 9.5  HGB 12.3*  HCT 38.3*  MCV 87.8  PLT 296   Basic Metabolic Panel: Recent Labs  Lab 01/16/24 1600  NA 133*  K 4.1  CL 98  CO2 23  GLUCOSE 281*  BUN 29*  CREATININE 0.81  CALCIUM  8.7*   GFR: Estimated Creatinine Clearance: 79.5 mL/min (by C-G formula based on SCr of 0.81 mg/dL). Liver Function Tests: No results for input(s): AST, ALT, ALKPHOS, BILITOT, PROT, ALBUMIN in the last 168 hours. No results for input(s): LIPASE, AMYLASE in the last 168 hours. No results for input(s): AMMONIA in the last 168 hours. Coagulation Profile: No results for input(s): INR, PROTIME in the last 168 hours. Cardiac Enzymes: No results for input(s): CKTOTAL, CKMB, CKMBINDEX, TROPONINI in the last 168 hours. BNP (last 3 results) Recent Labs    07/21/23 1246  PROBNP 9,804*   HbA1C: No results for input(s): HGBA1C in the last 72 hours. CBG: No results for input(s): GLUCAP in the last 168 hours. Lipid Profile: No results for input(s): CHOL, HDL, LDLCALC, TRIG, CHOLHDL, LDLDIRECT in the last 72 hours. Thyroid Function Tests: No results for input(s): TSH, T4TOTAL, FREET4, T3FREE, THYROIDAB in the last 72 hours. Anemia Panel: No results for input(s): VITAMINB12, FOLATE, FERRITIN, TIBC, IRON , RETICCTPCT in the last 72 hours. Urine analysis:    Component Value Date/Time   COLORURINE YELLOW (A) 12/04/2023 1141   APPEARANCEUR CLEAR (A) 12/04/2023 1141   LABSPEC 1.023 12/04/2023 1141   PHURINE 5.0 12/04/2023 1141   GLUCOSEU >=500 (A) 12/04/2023 1141   HGBUR SMALL (A) 12/04/2023 1141   BILIRUBINUR NEGATIVE 12/04/2023 1141   KETONESUR 20 (A) 12/04/2023 1141    PROTEINUR >=300 (A) 12/04/2023 1141   NITRITE NEGATIVE 12/04/2023 1141   LEUKOCYTESUR NEGATIVE 12/04/2023 1141    Radiological Exams on Admission: No results found. Data Reviewed for HPI: Relevant notes from primary care and specialist visits, past discharge summaries as available in EHR, including Care Everywhere. Prior diagnostic testing as pertinent to current admission diagnoses Updated medications and problem lists for reconciliation ED course, including vitals, labs, imaging, treatment and response to treatment Triage notes, nursing and pharmacy notes and ED provider's notes Notable results as noted above in HPI      Assessment and Plan: * Delayed wound healing Serratia wound infection Wound dehiscence right groin s/p repair of posterior and vascular stent/femoral artery rupture Long-term antibiotics-via PICC Vascular consulted for further orders Continue antibiotics-ertapenem  Pain control  Chronic systolic CHF (congestive heart failure) (HCC) EF 20 to 25% History of NSVT Clinically euvolemic Continue home GDMT of carvedilol , spironolactone , Entresto  and Farxiga   Uncontrolled type 2 diabetes mellitus with hyperglycemia, with long-term current use of insulin  (HCC) Blood glucose elevated to 281 Continue home basal insulin  with sliding scale coverage   History of alcohol  abuse On thiamine   Sleep apnea with nocturnal hypoxia Prn O2   DVT prophylaxis: Lovenox   Consults: vascular  Advance Care Planning:   Code Status: Prior   Family Communication: none  Disposition Plan: Back to previous home environment  Severity of Illness: The appropriate patient status for this patient is OBSERVATION. Observation status is judged to be reasonable and necessary in order to provide the required intensity of service to ensure the patient's safety.  The patient's presenting symptoms, physical exam findings, and initial radiographic and laboratory data in the context of their  medical condition is felt to place them at decreased risk for further clinical deterioration. Furthermore, it is anticipated that the patient will be medically stable for discharge from the hospital within 2 midnights of admission.   Author: Delayne LULLA Solian, MD 01/17/2024 1:00 AM  For on call review [email or web address].

## 2024-01-17 NOTE — Plan of Care (Signed)

## 2024-01-17 NOTE — Assessment & Plan Note (Addendum)
 EF 20 to 25% History of NSVT Clinically euvolemic Continue home GDMT of carvedilol , spironolactone , Entresto  and Farxiga 

## 2024-01-17 NOTE — Consult Note (Signed)
 [facility] Vascular Consult Note  MRN : [id number]  Manuel [name] is a 78 y.o. ([birth year]) male who presents with chief complaint of  Chief Complaint  Patient presents with   Wound Check    Wound vac unable to get a seal and is not working properly   .  History of Present Illness: Patient well known to service. Right groin wound dehiscence after revascularization. In facility with wound VAC. Facility unable to adequately manage wound VAC. Per patient it has been off the majority of his time there. Otherwise without complaint.  Current Facility-Administered Medications  Medication Dose Route Frequency Provider Last Rate Last Admin   acetaminophen  (TYLENOL ) tablet 650 mg  650 mg Oral Q6H PRN Duncan, Hazel V, MD       Or   acetaminophen  (TYLENOL ) suppository 650 mg  650 mg Rectal Q6H PRN Duncan, Hazel V, MD       albuterol  (PROVENTIL ) (2.5 MG/3ML) 0.083% nebulizer solution 2.5 mg  2.5 mg Nebulization Q2H PRN Duncan, Hazel V, MD       artificial tears ophthalmic solution 1 drop  1 drop Both Eyes PRN Cleatus Delayne GAILS, MD       aspirin  EC tablet 81 mg  81 mg Oral Daily Duncan, Hazel V, MD       atorvastatin  (LIPITOR ) tablet 80 mg  80 mg Oral QHS Duncan, Hazel V, MD   80 mg at 01/17/24 9795   carvedilol  (COREG ) tablet 3.125 mg  3.125 mg Oral BID WC Cleatus Delayne GAILS, MD       clopidogrel  (PLAVIX ) tablet 75 mg  75 mg Oral Daily Duncan, Hazel V, MD       dapagliflozin  propanediol (FARXIGA ) tablet 10 mg  10 mg Oral Daily Duncan, Hazel V, MD       enoxaparin  (LOVENOX ) injection 45 mg  0.5 mg/kg Subcutaneous Q24H Duncan, Hazel V, MD       ertapenem  (INVANZ ) 1 g in sodium chloride  0.9 % 100 mL IVPB  1 g Intravenous Q24H Duncan, Hazel V, MD       escitalopram  (LEXAPRO ) tablet 10 mg  10 mg Oral Daily Duncan, Hazel V, MD       ferrous sulfate  tablet 325 mg  325 mg Oral Q breakfast Cleatus Delayne GAILS, MD       folic acid  (FOLVITE ) tablet 1 mg  1 mg Oral Daily Cleatus Delayne GAILS, MD        HYDROcodone -acetaminophen  (NORCO/VICODIN) 5-325 MG per tablet 1-2 tablet  1-2 tablet Oral Q4H PRN Duncan, Hazel V, MD   1 tablet at 01/17/24 9795   insulin  aspart (novoLOG ) injection 0-15 Units  0-15 Units Subcutaneous TID WC Cleatus Delayne GAILS, MD       insulin  aspart (novoLOG ) injection 0-5 Units  0-5 Units Subcutaneous QHS Cleatus Delayne GAILS, MD       loratadine  (CLARITIN ) tablet 10 mg  10 mg Oral Daily PRN Duncan, Hazel V, MD       morphine  (PF) 2 MG/ML injection 2 mg  2 mg Intravenous Q2H PRN Duncan, Hazel V, MD       nitroGLYCERIN  (NITROSTAT ) SL tablet 0.4 mg  0.4 mg Sublingual Q5 min PRN Cleatus Delayne GAILS, MD       ondansetron  (ZOFRAN ) tablet 4 mg  4 mg Oral Q6H PRN Duncan, Hazel V, MD       Or   ondansetron  (ZOFRAN ) injection 4 mg  4 mg Intravenous Q6H PRN Cleatus Delayne GAILS, MD  pantoprazole  (PROTONIX ) EC tablet 40 mg  40 mg Oral Daily Duncan, Hazel V, MD       polyethylene glycol (MIRALAX  / GLYCOLAX ) packet 17 g  17 g Oral Daily Duncan, Hazel V, MD       sacubitril -valsartan  (ENTRESTO ) 24-26 mg per tablet  1 tablet Oral Q12H Cleatus Delayne GAILS, MD       spironolactone  (ALDACTONE ) tablet 25 mg  25 mg Oral Daily Duncan, Hazel V, MD       thiamine  (VITAMIN B1) tablet 100 mg  100 mg Oral Daily Duncan, Hazel V, MD       traZODone  (DESYREL ) tablet 100 mg  100 mg Oral QHS Duncan, Hazel V, MD   100 mg at 01/17/24 0204   Current Outpatient Medications  Medication Sig Dispense Refill   artificial tears ophthalmic solution Place 1 drop into both eyes as needed for dry eyes. 15 mL 0   aspirin  EC 81 MG tablet Take 1 tablet (81 mg total) by mouth daily. 150 tablet 2   atorvastatin  (LIPITOR ) 80 MG tablet Take 1 tablet (80 mg total) by mouth at bedtime. 90 tablet 3   carvedilol  (COREG ) 3.125 MG tablet Take 1 tablet (3.125 mg total) by mouth 2 (two) times daily with a meal.     clopidogrel  (PLAVIX ) 75 MG tablet Take 1 tablet (75 mg total) by mouth daily. 90 tablet 3   dapagliflozin  propanediol (FARXIGA )  10 MG TABS tablet Take 1 tablet (10 mg total) by mouth daily. 90 tablet 3   Ensure Max Protein (ENSURE MAX PROTEIN) LIQD Take 330 mLs (11 oz total) by mouth 2 (two) times daily. (Patient taking differently: Take 11 oz by mouth daily.) 10000 mL 3   ertapenem  (INVANZ ) IVPB Inject 1 g into the vein daily for 23 days. Indication:  Serratia bacteremia and infected vascular graft First Dose: Yes Last Day of Therapy:  01/22/2024 Labs - Once weekly:  CBC/D and CMP Fax weekly lab results  promptly to [phone] Method of administration: Mini-Bag Plus / Gravity Method of administration may be changed at the discretion of home infusion pharmacist based upon assessment of the patient and/or caregiver's ability to self-administer the medication ordered. Please leave PIC in place until doctor has seen patient or been notified Call [phone] with nay questions or critical values 24 Units 0   escitalopram  (LEXAPRO ) 10 MG tablet Take 1 tablet (10 mg total) by mouth daily.     ferrous sulfate  325 (65 FE) MG EC tablet Take 325 mg by mouth daily with breakfast.     fluticasone  (FLONASE ) 50 MCG/ACT nasal spray Place 1 spray into both nostrils 2 (two) times daily as needed for allergies or rhinitis.     folic acid  (FOLVITE ) 1 MG tablet Take 1 tablet (1 mg total) by mouth daily. 90 tablet 1   gabapentin  (NEURONTIN ) 100 MG capsule 1 po at bedtime. 90 capsule 3   insulin  aspart (NOVOLOG ) 100 UNIT/ML injection Inject 0-9 Units into the skin 3 (three) times daily with meals.     loratadine  (CLARITIN ) 10 MG tablet Take 1 tablet (10 mg total) by mouth daily as needed for allergies.     methocarbamol  (ROBAXIN ) 500 MG tablet Take 500 mg by mouth every 8 (eight) hours as needed for muscle spasms.     nitroGLYCERIN  (NITROSTAT ) 0.4 MG SL tablet Place 1 tablet (0.4 mg total) under the tongue every 5 (five) minutes as needed for chest pain. Max: 3 tablets per day  ondansetron  (ZOFRAN ) 4 MG tablet Take 1 tablet (4 mg total)  by mouth every 6 (six) hours as needed for nausea.     pantoprazole  (PROTONIX ) 40 MG tablet Take 1 tablet (40 mg total) by mouth daily.     polyethylene glycol (MIRALAX  / GLYCOLAX ) 17 g packet Take 17 g by mouth daily.     sacubitril -valsartan  (ENTRESTO ) 24-26 MG Take 1 tablet by mouth every 12 (twelve) hours. 180 tablet 3   spironolactone  (ALDACTONE ) 25 MG tablet Take 1 tablet (25 mg total) by mouth daily. 90 tablet 3   thiamine  (VITAMIN B-1) 100 MG tablet Take 1 tablet (100 mg total) by mouth daily. 90 tablet 0   traZODone  (DESYREL ) 100 MG tablet Take 1 tablet (100 mg total) by mouth at bedtime.      Past Medical History:  Diagnosis Date   Acute ST elevation myocardial infarction (STEMI) of inferior wall (HCC) 04/19/2010   a.) transfered from [facility] to [facility] --> LHC/PCI (very difficult procedure) --> 3.0 x 23 mm and 3.0 x 12 mm Xience stents to RCA   Allergies    Arthritis    Benign essential hypertension    Bilateral carotid artery disease 05/08/2021   a.) carotid doppler 05/08/2021: 1-39% BICA   CAD (coronary artery disease) 04/19/2010   a.) inferior STEMI 04/19/2010 --> LHC/PCI: 50-70% pD1, 80% pRI, 90/90/90% RCA (overlapping 3.0 x 23 and 3.0 x 12 mm Xience DES); b.) MV 11/10/2018: fixed minimally reversible inferior perfusion defect   Cellulitis of foot    Chronic HFrEF (heart failure with reduced ejection fraction) (HCC)    DDD (degenerative disc disease), cervical    Diabetes mellitus type 2, insulin  dependent (HCC)    Diverticulosis    Full dentures    Gout    Hard of hearing    History of bilateral cataract extraction 2022   History of ETOH abuse    Hyperlipidemia    Ischemic cardiomyopathy 04/19/2010   a.) TTE 04/19/2010: 40%; b.) TTE 04/20/2014: EF >55%; c.) TTE 11/10/2018: EF 45%; d.) TTE 11/14/2022: EF 25-30%; e. 06/2023 Echo: EF 25-30%, nl RV size/fxn. Mild MR. Mild-mod TR. Ao sclerosis w/o stenosis.   Long term current use of aspirin     Long term current use of  clopidogrel     Lumbar degenerative disc disease    Lumbar radiculopathy    Lumbar vertebral fracture (chronic superior endplate of L1)    NSTEMI (non-ST elevated myocardial infarction) (HCC) 01/15/2023   OSA (obstructive sleep apnea)    a.) unable to tolerate nocturnal PAP therapy   Peripheral artery disease    a.) stenting 05/14/21: 12 mm x 12 cm LifeStent RIGHT dis SFA/prox pop; b.) s/p cath directed thrombolysis RIGHT SFA/pop 06/14/21; c.) s/p mech thrombectomy + stenting 06/15/21: 8 mm x 25cm & 8 mm x 7.5cm Viabahn; d.) s/p BILAT CFA, profunda femoris, SFA endarterectomies + fogarty embolectomy + stenting 11/15/22: 12mm x 58mm Lifestream BILAT CIAs, 14 mm x 6 cm Lifestream & 13 mm x 5 cm Viabahn LEFT EIA   Peripheral neuropathy    Umbilical hernia     Past Surgical History:  Procedure Laterality Date   AMPUTATION Right 11/18/2022   Procedure: AMPUTATION 4TH AND 5TH RAY;  Surgeon: [name], [name];  Location: [facility];  Service: Orthopedics/Podiatry;  Laterality: Right;  4th and 5th toe   AMPUTATION Right 04/20/2023   Procedure: AMPUTATION BELOW KNEE;  Surgeon: [name], MD;  Location: [facility];  Service: Vascular;  Laterality: Right;  block then general   APPLICATION OF WOUND VAC Right 01/09/2023   Procedure: APPLICATION OF WOUND VAC;  Surgeon: [name], MD;  Location: [facility];  Service: Vascular;  Laterality: Right;   APPLICATION OF WOUND VAC Right 12/12/2023   Procedure: APPLICATION, WOUND VAC;  Surgeon: [name], MD;  Location: [facility];  Service: Vascular;  Laterality: Right;   APPLICATION OF WOUND VAC Right 12/17/2023   Procedure: APPLICATION, WOUND VAC;  Surgeon: [name], MD;  Location: [facility];  Service: Vascular;  Laterality: Right;  EXCHANGE   APPLICATION OF WOUND VAC Right 12/23/2023   Procedure: APPLICATION, WOUND VAC;  Surgeon: [name], MD;  Location: [facility];  Service: General;  Laterality: Right;   APPLICATION OF WOUND VAC Right 12/11/2023    Procedure: APPLICATION, WOUND VAC;  Surgeon: [name], MD;  Location: [facility];  Service: Vascular;  Laterality: Right;   CATARACT EXTRACTION W/PHACO Right 03/14/2021   Procedure: CATARACT EXTRACTION PHACO AND INTRAOCULAR LENS PLACEMENT (IOC) RIGHT DIABETIC;  Surgeon: [name], MD;  Location: [facility];  Service: Ophthalmology;  Laterality: Right;  Diabetic 16.78 01:39.9   CATARACT EXTRACTION W/PHACO Left 03/28/2021   Procedure: CATARACT EXTRACTION PHACO AND INTRAOCULAR LENS PLACEMENT (IOC) LEFT DIABETIC 6.93 01:22.0;  Surgeon: [name], MD;  Location: [facility];  Service: Ophthalmology;  Laterality: Left;  Diabetic   COLONOSCOPY     CORONARY ANGIOPLASTY WITH STENT PLACEMENT  03/2010   Procedure: CORONARY ANGIOPLASTY WITH STENT PLACEMENT; Location: [name]   CORONARY STENT INTERVENTION N/A 07/14/2023   Procedure: CORONARY STENT INTERVENTION;  Surgeon: [name], MD;  Location: [facility];  Service: Cardiovascular;  Laterality: N/A;   ENDARTERECTOMY FEMORAL Bilateral 11/15/2022   Procedure: BILATERAL COMMON FEMORAL PROFUNDA FEMORIS AND SUPERFICIAL FEMORAL ARTERY ENDARTECTOMIES, RIGHT FOGARTY EMBOLECTOMY OF THE RIGHT SFA  AND  POPLITEAL ARTERIES. EZELLA AND RIGHT LOWER EXTREMITY ANGIOGRAM.;  Surgeon: [name], MD;  Location: [facility];  Service: Vascular;  Laterality: Bilateral;   ENDARTERECTOMY FEMORAL Right 12/11/2023   Procedure: ENDARTERECTOMY, FEMORAL;  Surgeon: [name], MD;  Location: [facility];  Service: Vascular;  Laterality: Right;  Re-do of femoral endarterectomy with sartorius flap   FEMORAL ARTERY EXPLORATION Right 12/12/2023   Procedure: EXPLORATION, ARTERY, FEMORAL- stop bleeding with suture repair anastomaosis , abx beads;  Surgeon: [name], MD;  Location: [facility];  Service: Vascular;  Laterality: Right;   GROIN DEBRIDEMENT Right 12/28/2023   Procedure: DEBRIDEMENT, INGUINAL REGION;  Surgeon:  Elnor Norleen CROME, MD;  Location: [facility];  Service: Vascular;  Laterality: Right;   HEMATOMA EVACUATION N/A 12/12/2023   Procedure: EVACUATION HEMATOMA;  Surgeon: [name], MD;  Location: [facility];  Service: Vascular;  Laterality: N/A;   INSERTION OF ILIAC STENT Bilateral 11/15/2022   Procedure: BILATERAL STENT INSERTION IN BILATERAL  COMMON ILIAC ARTERY, STENT INSERTION OF LEFT EXTERNAL ILIAC ARTERY. ANGIOPLASTY RIGHT TIBIAL  AND POPLITEAL ARTERY.;  Surgeon: [name], MD;  Location: [facility];  Service: Vascular;  Laterality: Bilateral;   IRRIGATION AND DEBRIDEMENT KNEE Right 04/21/2023   Procedure: IRRIGATION AND DEBRIDEMENT KNEE;  Surgeon: Zafonte, Brian Khari, MD;  Location: [facility];  Service: Orthopedics;  Laterality: Right;   LOWER EXTREMITY ANGIOGRAPHY Right 05/14/2021   Procedure: LOWER EXTREMITY ANGIOGRAPHY;  Surgeon: [name], MD;  Location: [facility];  Service: Cardiovascular;  Laterality: Right;   LOWER EXTREMITY ANGIOGRAPHY Right 06/14/2021   Procedure: Lower Extremity Angiography;  Surgeon: [name], MD;  Location: [facility];  Service: Cardiovascular;  Laterality: Right;   LOWER EXTREMITY ANGIOGRAPHY Right 11/11/2022   Procedure: Lower Extremity Angiography;  Surgeon: [name], MD;  Location: [facility];  Service: Cardiovascular;  Laterality: Right;   LOWER EXTREMITY ANGIOGRAPHY Right 12/04/2023   Procedure: Lower Extremity Angiography;  Surgeon: [name], MD;  Location: [facility];  Service: Cardiovascular;  Laterality: Right;   LOWER EXTREMITY INTERVENTION Right 06/15/2021   Procedure: LOWER EXTREMITY INTERVENTION;  Surgeon: [name], MD;  Location: [facility];  Service: Cardiovascular;  Laterality: Right;   RIGHT/LEFT HEART CATH AND CORONARY ANGIOGRAPHY N/A 07/14/2023   Procedure: RIGHT/LEFT HEART CATH AND CORONARY ANGIOGRAPHY;  Surgeon: [name], MD;  Location: [facility];   Service: Cardiovascular;  Laterality: N/A;   TONSILLECTOMY     TRANSMETATARSAL AMPUTATION Right 02/17/2023   Procedure: TRANSMETATARSAL AMPUTATION;  Surgeon: Lennie Barter, [name];  Location: [facility];  Service: Orthopedics/Podiatry;  Laterality: Right;   WOUND DEBRIDEMENT Right 01/09/2023   Procedure: DEBRIDEMENT WOUND;  Surgeon: [name], MD;  Location: [facility];  Service: Vascular;  Laterality: Right;    Social History Social History   Tobacco Use   Smoking status: Never    Passive exposure: Never   Smokeless tobacco: Never  Vaping Use   Vaping status: Never Used  Substance Use Topics   Alcohol  use: Yes    Alcohol /week: 21.0 standard drinks of alcohol     Types: 7 Glasses of wine, 14 Cans of beer per week    Comment: occassional   Drug use: No    Family History Family History  Problem Relation Age of Onset   Scoliosis Mother    Heart disease Father     No Known Allergies   REVIEW OF SYSTEMS (Negative unless checked)  Constitutional: [] Weight loss  [] Fever  [] Chills Cardiac: [] Chest pain   [] Chest pressure   [] Palpitations   [] Shortness of breath when laying flat   [] Shortness of breath at rest   [] Shortness of breath with exertion. Vascular:  [] Pain in legs with walking   [] Pain in legs at rest   [] Pain in legs when laying flat   [] Claudication   [] Pain in feet when walking  [] Pain in feet at rest  [] Pain in feet when laying flat   [] History of DVT   [] Phlebitis   [] Swelling in legs   [] Varicose veins   [] Non-healing ulcers Pulmonary:   [] Uses home oxygen   [] Productive cough   [] Hemoptysis   [] Wheeze  [] COPD   [] Asthma Neurologic:  [] Dizziness  [] Blackouts   [] Seizures   [] History of stroke   [] History of TIA  [] Aphasia   [] Temporary blindness   [] Dysphagia   [] Weakness or numbness in arms   [] Weakness or numbness in legs Musculoskeletal:  [] Arthritis   [] Joint swelling   [] Joint pain   [] Low back pain Hematologic:  [] Easy bruising  [] Easy bleeding   [] Hypercoagulable  state   [] Anemic  [] Hepatitis Gastrointestinal:  [] Blood in stool   [] Vomiting blood  [] Gastroesophageal reflux/heartburn   [] Difficulty swallowing. Genitourinary:  [] Chronic kidney disease   [] Difficult urination  [] Frequent urination  [] Burning with urination   [] Blood in urine Skin:  [] Rashes   [] Ulcers   [] Wounds Psychological:  [] History of anxiety   []  History of major depression.  Physical Examination  Vitals:   01/17/24 0030 01/17/24 0100 01/17/24 0235 01/17/24 0548  BP: 131/70 ROLLEN)  146/75 (!) 143/70 109/82  Pulse: 68 72 71 67  Resp:  19  18  Temp:   97.9 F (36.6 C) 97.7 F (36.5 C)  TempSrc:   Oral Oral  SpO2: 93% 95% 98% 98%  Weight:      Height:       Body mass index is 30.41 kg/m. Gen:  WD/WN, NAD Head: [place]/AT, No temporalis wasting. Prominent temp pulse not noted. Pulmonary:  Good air movement, respirations not labored, equal bilaterally.  Cardiac: Reg Gastrointestinal: soft, non-tender/non-distended. No guarding/reflex.  Musculoskeletal: RIGHT Groin VAC in place with good seal and suction, thigh soft, BKA stump viable. Neurologic: Sensation grossly intact in extremities.  Symmetrical.  Speech is fluent. Motor exam as listed above. Psychiatric: Judgment intact, Mood & affect appropriate for pt's clinical situation.      CBC Lab Results  Component Value Date   WBC 9.5 01/16/2024   HGB 12.3 (L) 01/16/2024   HCT 38.3 (L) 01/16/2024   MCV 87.8 01/16/2024   PLT 296 01/16/2024    BMET    Component Value Date/Time   NA 133 (L) 01/16/2024 1600   NA 137 07/21/2023 1246   NA 137 02/16/2012 2013   K 4.1 01/16/2024 1600   K 3.8 02/16/2012 2013   CL 98 01/16/2024 1600   CL 102 02/16/2012 2013   CO2 23 01/16/2024 1600   CO2 25 02/16/2012 2013   GLUCOSE 281 (H) 01/16/2024 1600   GLUCOSE 154 (H) 02/16/2012 2013   BUN 29 (H) 01/16/2024 1600   BUN 30 (H) 07/21/2023 1246   BUN 10 02/16/2012 2013   CREATININE 0.81 01/16/2024 1600   CREATININE 0.72 02/16/2012  2013   CALCIUM  8.7 (L) 01/16/2024 1600   CALCIUM  9.6 02/16/2012 2013   GFRNONAA >60 01/16/2024 1600   GFRNONAA >60 02/16/2012 2013   GFRAA >60 12/09/2018 0929   GFRAA >60 02/16/2012 2013   Estimated Creatinine Clearance: 79.5 mL/min (by C-G formula based on SCr of 0.81 mg/dL).  COAG Lab Results  Component Value Date   INR 1.3 (H) 12/12/2023   INR 1.3 (H) 12/04/2023   INR 1.4 (H) 07/10/2023    Radiology CT ANGIO PELVIS W OR WO CONTRAST Result Date: 12/27/2023 CLINICAL DATA:  Excess wound drainage post repair. Evaluate for new rupture or leak. Complex vascular history with major vascular reconstruction for infection and bleeding in the right groin. History of sartorius flap placement. EXAM: CT ANGIOGRAPHY PELVIS TECHNIQUE: Non-contrast CT of the pelvis was initially obtained. Multidetector CT imaging through the pelvis was performed using the standard protocol during bolus administration of intravenous contrast. Multiplanar reconstructed images and MIPs were obtained and reviewed to evaluate the vascular anatomy. RADIATION DOSE REDUCTION: This exam was performed according to the departmental dose-optimization program which includes automated exposure control, adjustment of the mA and/or kV according to patient size and/or use of iterative reconstruction technique. CONTRAST:  OMNIPAQUE  IOHEXOL  350 MG/ML SOLN COMPARISON:  CT 12/04/2023 FINDINGS: VASCULAR Aorta: Extensive atherosclerotic disease in the distal abdominal aorta. Distal abdominal aorta at the bifurcation measures roughly 3.1 cm and stable. Inflow: Bilateral common iliac artery stents at the aortic bifurcation. Right common iliac artery stent is patent and appears to terminate in the distal common iliac artery. Severe disease at the origin of the right internal iliac artery. Right external iliac artery is patent. Surgical bypass graft between the right external iliac artery and right profunda femoral artery that is patent. No evidence  for contrast extravasation in this area. Proximal  right SFA has been resected. Visualized right profunda femoral arteries are patent with atherosclerotic disease. Left iliac stents involving the left common and left external iliac artery are patent. Stents terminate in the distal left external iliac artery. No significant flow in the proximal left internal iliac artery. Left common femoral artery is patent. Metallic structures anterior to left common femoral artery compatible with a closure device. Metallic structure just anterior to the proximal left SFA could also represent a vascular closure device. Mild-to-moderate stenosis in the proximal left SFA. Left profunda femoral arteries are heavily diseased but patent. Veins: Distal IVC is patent. Bilateral common and external iliac veins are patent. Limited evaluation of the proximal femoral veins. Review of the MIP images confirms the above findings. NON-VASCULAR Urinary Tract: No acute abnormality in the visualized kidneys. Normal appearance of the urinary bladder. Bowel: Extensive diverticulosis in the sigmoid colon without acute bowel inflammation. No significant bowel dilatation in the pelvis. Lymphatic: No significant lymph node enlargement in the pelvis Reproductive: Prostate is unremarkable. Other: No free fluid in the pelvis. Musculoskeletal: Diffuse subcutaneous edema throughout the pelvis. Open wound in the anterior right pelvis. Bilobed fluid collection in the anterior pelvis containing a small amount of gas. The lateral aspect of the collection is low-density and the more medial collection is slightly hyperdense. This bilobed collection measures 10.1 cm in transverse dimension on image 44/16. The medial aspect of the collection extends along the anteromedial aspect of the left thigh and roughly measures 13 cm in the craniocaudal dimension. Small amount of fluid extends into the right retroperitoneal space on image 34/16. Vascular bypass from the right  external to the right profunda femoral arteries is surrounded by the more medial fluid collection. Extensive degenerative facet disease in lower lumbar spine. Disc space narrowing at L5-S1. Review of the MIP images confirms the above findings. IMPRESSION: 1. Extensive postsurgical changes. The surgical bypass from the right external iliac artery to right profunda femoral arteries is patent. No evidence for contrast extravasation or active bleeding. However, the surgical bypass is surrounded by the fluid collection in the medial right thigh that contains a small amount of gas. The fluid collections are nonspecific. Abscess or infected seroma/hematoma cannot be excluded. 2. Extensive atherosclerotic disease involving the aorta and iliac arteries. Bilateral iliac artery stents are patent. 3. 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 4. Extensive subcutaneous edema in the pelvis and proximal thighs. Electronically Signed   By: Juliene Balder M.D.   On: 12/27/2023 10:56   US  EKG SITE RITE Result Date: 12/22/2023 If Site Rite image not attached, placement could not be confirmed due to current cardiac rhythm.     Assessment/Plan Wound VAC to right groin- Consult wound care- keep current VAC until Tuesday. Appreciate OR team for [facility] placement Case Management - may need new facility placement next week as current facility unable to consistently manage wound [facility] Appreciate Hospitalist input and care   [name], MD  01/17/2024 6:01 AM    This note was created with Dragon medical transcription system.  Any error is purely unintentional

## 2024-01-17 NOTE — ED Notes (Signed)
 Wound vac intact to right groin, alarms of seal not intact.  MD notified, consult to wound nurse placed.

## 2024-01-18 DIAGNOSIS — T148XXD Other injury of unspecified body region, subsequent encounter: Secondary | ICD-10-CM | POA: Diagnosis not present

## 2024-01-18 LAB — BASIC METABOLIC PANEL WITH GFR
Anion gap: 9 (ref 5–15)
BUN: 24 mg/dL — ABNORMAL HIGH (ref 8–23)
CO2: 25 mmol/L (ref 22–32)
Calcium: 8.9 mg/dL (ref 8.9–10.3)
Chloride: 103 mmol/L (ref 98–111)
Creatinine, Ser: 0.68 mg/dL (ref 0.61–1.24)
GFR, Estimated: >60 mL/min (ref >60)
Glucose, Bld: 174 mg/dL — ABNORMAL HIGH (ref 70–99)
Potassium: 3.8 mmol/L (ref 3.5–5.1)
Sodium: 137 mmol/L (ref 135–145)

## 2024-01-18 LAB — CBC
HCT: 34 % — ABNORMAL LOW (ref 39.0–52.0)
Hemoglobin: 11.3 g/dL — ABNORMAL LOW (ref 13.0–17.0)
MCH: 28.4 pg (ref 26.0–34.0)
MCHC: 33.2 g/dL (ref 30.0–36.0)
MCV: 85.4 fL (ref 80.0–100.0)
Platelets: 253 K/uL (ref 150–400)
RBC: 3.98 MIL/uL — ABNORMAL LOW (ref 4.22–5.81)
RDW: 14.5 % (ref 11.5–15.5)
WBC: 6.3 K/uL (ref 4.0–10.5)
nRBC: 0 % (ref 0.0–0.2)

## 2024-01-18 LAB — GLUCOSE, CAPILLARY
Glucose-Capillary: 173 mg/dL — ABNORMAL HIGH (ref 70–99)
Glucose-Capillary: 140 mg/dL — ABNORMAL HIGH (ref 70–99)
Glucose-Capillary: 161 mg/dL — ABNORMAL HIGH (ref 70–99)
Glucose-Capillary: 160 mg/dL — ABNORMAL HIGH (ref 70–99)
Glucose-Capillary: 129 mg/dL — ABNORMAL HIGH (ref 70–99)

## 2024-01-18 LAB — PHOSPHORUS: Phosphorus: 3.5 mg/dL (ref 2.5–4.6)

## 2024-01-18 LAB — MAGNESIUM: Magnesium: 2.1 mg/dL (ref 1.7–2.4)

## 2024-01-18 NOTE — Consult Note (Addendum)
 WOC team consulted for R groin NPWT.  It appear from review of notes OR team was able to fix leaking vac Saturday, has been seen by vascular who plans to keep NPWT in place until Tuesday.    Also noted in photos full thickess wound L great toe. Will write to cover with Xeroform gauze and secure with silicone foam or dry gauze and tape daily.    WOC team will place on list to follow-up to determine schedule of NPWT dressing changes with vascular on Monday 9/29.   Thank you,    Powell Bar MSN, RN-BC, [employer]

## 2024-01-18 NOTE — Progress Notes (Signed)
 [facility] Progress Note  Patient: Manuel [name]    FMW:[id number]  DOA: 01/16/2024     Date of Service: the patient was seen and examined on 01/18/2024  Chief Complaint  Patient presents with   Wound Check    Wound vac unable to get a seal and is not working properly    Brief hospital course: Manuel [name] is a 78 y.o. male with medical history significant for DM, HTN, OSA,  HFrEF 25-30%,  CAD s/p multiple stents, extensive PAD/carotid artery stenosis with history of revascularization, most recently stent placement right femoral artery complicated by femoral artery rupture/repair/wound dehiscence/serratia infection currently on wound VAC and ertapenem  via PICC, sent in from vascular clinic for wound VAC change due to delayed healing and difficulty maintaining a seal at his facility.  Per review of outpatient note, he is to return to the OR early next week for wound washout .  Patient has pain in the area but otherwise denies complaints In the ED, vitals unremarkable.  Labs unremarkable except for elevated blood sugar of 281. Patient treated with morphine  for pain. Admission requested    Assessment and Plan:  # Delayed wound healing # Serratia wound infection # Wound dehiscence right groin s/p repair of posterior and vascular stent/femoral artery rupture # Long-term antibiotics-via PICC Vascular surgery consulted, recommended washout next week and continue wound VAC for now till Tuesday. Continue antibiotics-ertapenem  till 10/2 as planned previously Pain control   # Chronic systolic CHF (congestive heart failure) (HCC) EF 20 to 25% History of NSVT Clinically euvolemic Continue home GDMT of carvedilol , spironolactone , Entresto  and Farxiga  9/27 hypotensive episode, NS to 50 mL bolus given, BP is stable now Monitor BP and titrate medications accordingly   # Uncontrolled type 2 diabetes mellitus with hyperglycemia, with long-term current use of insulin  (HCC) Blood glucose  elevated to 281 Continue home basal insulin  with sliding scale coverage     # History of alcohol  abuse On thiamine    # Sleep apnea with nocturnal hypoxia Prn O2    Body mass index is 30.41 kg/m.  Interventions:   Diet: Carb modified/heart rate with diet DVT Prophylaxis: Subcutaneous Lovenox    Advance goals of care discussion: DNR-limited  Family Communication: family was not present at bedside, at the time of interview.  The pt provided permission to discuss medical plan with the family. Opportunity was given to ask question and all questions were answered satisfactorily.   Disposition:  Pt is from SNF, admitted with nonhealing wound and SNF unable to manage wound VAC, patient needs surgical intervention next week, which precludes a safe discharge. Discharge to SNF, when cleared by vascular surgery most likely in few days.  Subjective: No significant events overnight.  Patient is feeling better, denies any pain, wound VAC is working well.   Physical Exam: General: NAD, lying comfortably Appear in no distress, affect appropriate Eyes: PERRLA ENT: Oral Mucosa Clear, moist  Neck: no JVD,  Cardiovascular: S1 and S2 Present, no Murmur,  Respiratory: good respiratory effort, Bilateral Air entry equal and Decreased, no Crackles, no wheezes Abdomen: Bowel Sound present, Soft and no tenderness,  Skin: no rashes Extremities: Right groin wound VAC attached, no Pedal edema, no calf tenderness Neurologic: without any new focal findings Gait not checked due to patient safety concerns  Vitals:   01/17/24 1715 01/17/24 2105 01/18/24 0342 01/18/24 0750  BP: (!) 159/91 (!) 143/56 117/64 (!) 156/82  Pulse: 75 83 67 80  Resp: 18 16 15 16   Temp:  98.3 F (36.8 C)  97.7 F (36.5 C) 97.6 F (36.4 C)  TempSrc: Oral   Oral  SpO2: 100% 94% 92% 99%  Weight:      Height:        Intake/Output Summary (Last 24 hours) at 01/18/2024 1535 Last data filed at 01/18/2024 1300 Gross per 24 hour   Intake 630 ml  Output 0 ml  Net 630 ml   Filed Weights   01/16/24 1546  Weight: 90.7 kg    Data Reviewed: I have personally reviewed and interpreted daily labs, tele strips, imagings as discussed above. I reviewed all nursing notes, pharmacy notes, vitals, pertinent old records I have discussed plan of care as described above with RN and patient/family.  CBC: Recent Labs  Lab 01/16/24 1600 01/18/24 0550  WBC 9.5 6.3  HGB 12.3* 11.3*  HCT 38.3* 34.0*  MCV 87.8 85.4  PLT 296 253   Basic Metabolic Panel: Recent Labs  Lab 01/16/24 1600 01/18/24 0550  NA 133* 137  K 4.1 3.8  CL 98 103  CO2 23 25  GLUCOSE 281* 174*  BUN 29* 24*  CREATININE 0.81 0.68  CALCIUM  8.7* 8.9  MG  --  2.1  PHOS  --  3.5    Studies: DG Chest Port 1 View Result Date: 01/17/2024 EXAM: 1 VIEW(S) XRAY OF THE CHEST 01/17/2024 06:20:11 PM COMPARISON: 12/12/2023 CLINICAL HISTORY: PICC (peripherally inserted central catheter) in place FINDINGS: LINES, TUBES AND DEVICES: Right PICC line placement with tip in mid SVC. Endotracheal and nasogastric tubes removed. LUNGS AND PLEURA: Low lung volumes causing crowding of pulmonary vasculature. No focal pulmonary opacity. No pulmonary edema. No pleural effusion. No pneumothorax. HEART AND MEDIASTINUM: Atherosclerotic calcification of aortic arch. Cardiomegaly. BONES AND SOFT TISSUES: Old right rib deformities from healed fractures. Bilateral degenerative glenohumeral arthropathy. No acute osseous abnormality. IMPRESSION: 1. Right PICC line appropriately positioned with tip in the mid SVC. 2. Cardiomegaly. 3. Low lung volumes with vascular crowding. Electronically signed by: Norman Gatlin MD 01/17/2024 06:38 PM EDT RP Workstation: [id number]    Scheduled Meds:  aspirin  EC  81 mg Oral Daily   atorvastatin   80 mg Oral QHS   carvedilol   3.125 mg Oral BID WC   Chlorhexidine  Gluconate Cloth  6 each Topical Daily   clopidogrel   75 mg Oral Daily   dapagliflozin   propanediol  10 mg Oral Daily   enoxaparin  (LOVENOX ) injection  0.5 mg/kg Subcutaneous Q24H   escitalopram   10 mg Oral Daily   ferrous sulfate   325 mg Oral Q breakfast   folic acid   1 mg Oral Daily   insulin  aspart  0-15 Units Subcutaneous TID WC   insulin  aspart  0-5 Units Subcutaneous QHS   pantoprazole   40 mg Oral Daily   polyethylene glycol  17 g Oral Daily   sacubitril -valsartan   1 tablet Oral Q12H   sodium chloride  flush  10-40 mL Intracatheter Q12H   spironolactone   25 mg Oral Daily   thiamine   100 mg Oral Daily   traZODone   100 mg Oral QHS   Continuous Infusions:  ertapenem  1 g (01/18/24 1339)   PRN Meds: acetaminophen  **OR** acetaminophen , albuterol , artificial tears, HYDROcodone -acetaminophen , loratadine , morphine  injection, nitroGLYCERIN , ondansetron  **OR** ondansetron  (ZOFRAN ) IV, sodium chloride  flush  Time spent: 40 minutes  Author: ELVAN SOR. MD [facility] 01/18/2024 3:35 PM  To reach On-call, see care teams to locate the attending and reach out to them via [email or web address]. If 7PM-7AM, please contact night-coverage If you still have difficulty  reaching the attending provider, please page the [facility] (Director on Call) for [facility] on amion for assistance.

## 2024-01-18 NOTE — Progress Notes (Signed)
   Subjective/Chief Complaint: Without complaint. Wound VAC continues to work well   Objective: Vital signs in last 24 hours: Temp:  [97.6 F (36.4 C)-98.3 F (36.8 C)] 97.6 F (36.4 C) (09/28 0750) Pulse Rate:  [67-83] 80 (09/28 0750) Resp:  [15-18] 16 (09/28 0750) BP: (117-159)/(56-91) 156/82 (09/28 0750) SpO2:  [92 %-100 %] 99 % (09/28 0750) Last BM Date : 01/17/24  Intake/Output from previous day: 09/27 0701 - 09/28 0700 In: 150 [P.O.:150] Out: 0  Intake/Output this shift: Total I/O In: 240 [P.O.:240] Out: -   General appearance: alert and no distress Cardio: regular rate and rhythm Extremities: RIGHT groin wound VAC with good seal/suction- canister- empty, thigh soft, stump viable  Lab Results:  Recent Labs    01/16/24 1600 01/18/24 0550  WBC 9.5 6.3  HGB 12.3* 11.3*  HCT 38.3* 34.0*  PLT 296 253   BMET Recent Labs    01/16/24 1600 01/18/24 0550  NA 133* 137  K 4.1 3.8  CL 98 103  CO2 23 25  GLUCOSE 281* 174*  BUN 29* 24*  CREATININE 0.81 0.68  CALCIUM  8.7* 8.9   PT/INR No results for input(s): LABPROT, INR in the last 72 hours. ABG No results for input(s): PHART, HCO3 in the last 72 hours.  Invalid input(s): PCO2, PO2  Studies/Results: DG Chest Port 1 View Result Date: 01/17/2024 EXAM: 1 VIEW(S) XRAY OF THE CHEST 01/17/2024 06:20:11 PM COMPARISON: 12/12/2023 CLINICAL HISTORY: PICC (peripherally inserted central catheter) in place FINDINGS: LINES, TUBES AND DEVICES: Right PICC line placement with tip in mid SVC. Endotracheal and nasogastric tubes removed. LUNGS AND PLEURA: Low lung volumes causing crowding of pulmonary vasculature. No focal pulmonary opacity. No pulmonary edema. No pleural effusion. No pneumothorax. HEART AND MEDIASTINUM: Atherosclerotic calcification of aortic arch. Cardiomegaly. BONES AND SOFT TISSUES: Old right rib deformities from healed fractures. Bilateral degenerative glenohumeral arthropathy. No acute osseous  abnormality. IMPRESSION: 1. Right PICC line appropriately positioned with tip in the mid SVC. 2. Cardiomegaly. 3. Low lung volumes with vascular crowding. Electronically signed by: Norman Gatlin MD 01/17/2024 06:38 PM EDT RP Workstation: [id number]    Anti-infectives: Anti-infectives (From admission, onward)    Start     Dose/Rate Route Frequency Ordered Stop   01/17/24 1100  ertapenem  (INVANZ ) 1 g in sodium chloride  0.9 % 100 mL IVPB        1 g 200 mL/hr over 30 Minutes Intravenous Every 24 hours 01/17/24 0255     01/17/24 0115  ertapenem  (INVANZ ) IVPB  Status:  Discontinued        1 g Intravenous Every 24 hours 01/17/24 0105 01/17/24 0254       Assessment/Plan: Continue Wound VAC- change Tuesday Case Management for new Rehab placement that can manage wound [facility] Appreciate Hospitalist input and care    LOS: 0 days    Tisa Dakin A 01/18/2024

## 2024-01-18 NOTE — Plan of Care (Signed)

## 2024-01-19 DIAGNOSIS — D649 Anemia, unspecified: Secondary | ICD-10-CM | POA: Diagnosis not present

## 2024-01-19 DIAGNOSIS — T148XXD Other injury of unspecified body region, subsequent encounter: Secondary | ICD-10-CM | POA: Diagnosis not present

## 2024-01-19 DIAGNOSIS — B9689 Other specified bacterial agents as the cause of diseases classified elsewhere: Secondary | ICD-10-CM | POA: Diagnosis not present

## 2024-01-19 DIAGNOSIS — R7881 Bacteremia: Secondary | ICD-10-CM

## 2024-01-19 DIAGNOSIS — T827XXD Infection and inflammatory reaction due to other cardiac and vascular devices, implants and grafts, subsequent encounter: Secondary | ICD-10-CM | POA: Diagnosis not present

## 2024-01-19 LAB — GLUCOSE, CAPILLARY
Glucose-Capillary: 122 mg/dL — ABNORMAL HIGH (ref 70–99)
Glucose-Capillary: 129 mg/dL — ABNORMAL HIGH (ref 70–99)
Glucose-Capillary: 172 mg/dL — ABNORMAL HIGH (ref 70–99)
Glucose-Capillary: 172 mg/dL — ABNORMAL HIGH (ref 70–99)

## 2024-01-19 LAB — PHOSPHORUS: Phosphorus: 3.3 mg/dL (ref 2.5–4.6)

## 2024-01-19 LAB — CBC
HCT: 34.1 % — ABNORMAL LOW (ref 39.0–52.0)
Hemoglobin: 11.4 g/dL — ABNORMAL LOW (ref 13.0–17.0)
MCH: 28.4 pg (ref 26.0–34.0)
MCHC: 33.4 g/dL (ref 30.0–36.0)
MCV: 85 fL (ref 80.0–100.0)
Platelets: 253 K/uL (ref 150–400)
RBC: 4.01 MIL/uL — ABNORMAL LOW (ref 4.22–5.81)
RDW: 14.5 % (ref 11.5–15.5)
WBC: 6.9 K/uL (ref 4.0–10.5)
nRBC: 0 % (ref 0.0–0.2)

## 2024-01-19 LAB — BASIC METABOLIC PANEL WITH GFR
Anion gap: 6 (ref 5–15)
BUN: 21 mg/dL (ref 8–23)
CO2: 26 mmol/L (ref 22–32)
Calcium: 9 mg/dL (ref 8.9–10.3)
Chloride: 105 mmol/L (ref 98–111)
Creatinine, Ser: 0.87 mg/dL (ref 0.61–1.24)
GFR, Estimated: >60 mL/min (ref >60)
Glucose, Bld: 141 mg/dL — ABNORMAL HIGH (ref 70–99)
Potassium: 4.1 mmol/L (ref 3.5–5.1)
Sodium: 137 mmol/L (ref 135–145)

## 2024-01-19 LAB — MAGNESIUM: Magnesium: 1.9 mg/dL (ref 1.7–2.4)

## 2024-01-19 LAB — MRSA NEXT GEN BY PCR, NASAL: MRSA by PCR Next Gen: NOT DETECTED

## 2024-01-19 NOTE — Consult Note (Addendum)
 WOC Nurse Consult Note: Vascular team following for assessment and plan of care.  WOC team Requested to change Vac dressing on Tuesday.   Post note 0900: Assessed dressing which is intact with good seal to 100 mm cont suction and machine is functioning. Pt discussed his concerns for a half hour regarding Vac changes and care at previous facility and he was very disappointed and feels they set him back regarding his recovery.  He is adamant that he wants to continue Vac therapy with someone who knows what they are doing, so I don't get an infection or loose the rest of my leg. Discussed with him that I will change the dressing tomorrow as requested by the vascular team, and I am not informed regarding what the next steps regarding discharge planning are after that point.   Thank-you,  Stephane Fought MSN, RN, CWOCN, CWCN-AP, CNS Contact Mon-Fri 0700-1500: [phone]

## 2024-01-19 NOTE — Care Management Obs Status (Signed)
 MEDICARE OBSERVATION STATUS NOTIFICATION   Patient Details  Name: Manuel [name] MRN: [id number] Date of Birth: 17-May-1945   Medicare Observation Status Notification Given:  Yes    Cloyce Paterson W, CMA 01/19/2024, 11:57 AM

## 2024-01-19 NOTE — Progress Notes (Signed)
 [facility] Progress Note  Patient: Manuel [name]    FMW:[id number]  DOA: 01/16/2024     Date of Service: the patient was seen and examined on 01/19/2024  Chief Complaint  Patient presents with   Wound Check    Wound vac unable to get a seal and is not working properly    Brief hospital course: Manuel [name] is a 78 y.o. male with medical history significant for DM, HTN, OSA,  HFrEF 25-30%,  CAD s/p multiple stents, extensive PAD/carotid artery stenosis with history of revascularization, most recently stent placement right femoral artery complicated by femoral artery rupture/repair/wound dehiscence/serratia infection currently on wound VAC and ertapenem  via PICC, sent in from vascular clinic for wound VAC change due to delayed healing and difficulty maintaining a seal at his facility.  Per review of outpatient note, he is to return to the OR early next week for wound washout .  Patient has pain in the area but otherwise denies complaints In the ED, vitals unremarkable.  Labs unremarkable except for elevated blood sugar of 281. Patient treated with morphine  for pain. Admission requested    Assessment and Plan:  # Delayed wound healing # Serratia wound infection # Wound dehiscence right groin s/p repair of posterior and vascular stent/femoral artery rupture # Long-term antibiotics-via PICC Vascular surgery consulted, recommended washout next week and continue wound VAC for now till Tuesday. Continue antibiotics-ertapenem  till 10/2 as planned previously PRN meds for Pain control 9/29 ID consulted to update duration of antibiotics?    # Chronic systolic CHF (congestive heart failure) (HCC) EF 20 to 25% History of NSVT Clinically euvolemic Continue home GDMT of carvedilol , spironolactone , Entresto  and Farxiga  9/27 hypotensive episode, NS to 50 mL bolus given, BP is stable now Monitor BP and titrate medications accordingly   # Uncontrolled type 2 diabetes mellitus with  hyperglycemia, with long-term current use of insulin  (HCC) Blood glucose elevated to 281 Continue home basal insulin  with sliding scale coverage     # History of alcohol  abuse On thiamine    # Sleep apnea with nocturnal hypoxia Prn O2    Body mass index is 30.41 kg/m.  Interventions:   Diet: Carb modified/heart rate with diet DVT Prophylaxis: Subcutaneous Lovenox    Advance goals of care discussion: DNR-limited  Family Communication: family was not present at bedside, at the time of interview.  The pt provided permission to discuss medical plan with the family. Opportunity was given to ask question and all questions were answered satisfactorily.   Disposition:  Pt is from SNF, admitted with nonhealing wound and SNF unable to manage wound VAC, patient needs surgical intervention next week, which precludes a safe discharge. Discharge to SNF, when cleared by vascular surgery most likely in few days.  Subjective: No significant events overnight.  Patient was laying comfortably, denied any pain, wound VAC is working fine.  No complaints.    Physical Exam: General: NAD, lying comfortably Appear in no distress, affect appropriate Eyes: PERRLA ENT: Oral Mucosa Clear, moist  Neck: no JVD,  Cardiovascular: S1 and S2 Present, no Murmur,  Respiratory: good respiratory effort, Bilateral Air entry equal and Decreased, no Crackles, no wheezes Abdomen: Bowel Sound present, Soft and no tenderness,  Skin: no rashes Extremities: s/p right BKA, right groin wound VAC attached, no Pedal edema, no calf tenderness Neurologic: without any new focal findings Gait not checked due to patient safety concerns  Vitals:   01/18/24 1658 01/18/24 1935 01/19/24 0422 01/19/24 0803  BP: (!) 141/80 ROLLEN)  153/84 120/70 (!) 156/86  Pulse: 80 80 62 75  Resp: 15 19 17 18   Temp: 98.4 F (36.9 C) 98.3 F (36.8 C) 98.3 F (36.8 C) 98.2 F (36.8 C)  TempSrc:  Oral Oral   SpO2: 99% 98% 97% 99%  Weight:       Height:        Intake/Output Summary (Last 24 hours) at 01/19/2024 1516 Last data filed at 01/19/2024 1300 Gross per 24 hour  Intake 880 ml  Output 335 ml  Net 545 ml   Filed Weights   01/16/24 1546  Weight: 90.7 kg    Data Reviewed: I have personally reviewed and interpreted daily labs, tele strips, imagings as discussed above. I reviewed all nursing notes, pharmacy notes, vitals, pertinent old records I have discussed plan of care as described above with RN and patient/family.  CBC: Recent Labs  Lab 01/16/24 1600 01/18/24 0550 01/19/24 0321  WBC 9.5 6.3 6.9  HGB 12.3* 11.3* 11.4*  HCT 38.3* 34.0* 34.1*  MCV 87.8 85.4 85.0  PLT 296 253 253   Basic Metabolic Panel: Recent Labs  Lab 01/16/24 1600 01/18/24 0550 01/19/24 0321  NA 133* 137 137  K 4.1 3.8 4.1  CL 98 103 105  CO2 23 25 26   GLUCOSE 281* 174* 141*  BUN 29* 24* 21  CREATININE 0.81 0.68 0.87  CALCIUM  8.7* 8.9 9.0  MG  --  2.1 1.9  PHOS  --  3.5 3.3    Studies: No results found.   Scheduled Meds:  aspirin  EC  81 mg Oral Daily   atorvastatin   80 mg Oral QHS   carvedilol   3.125 mg Oral BID WC   Chlorhexidine  Gluconate Cloth  6 each Topical Daily   clopidogrel   75 mg Oral Daily   dapagliflozin  propanediol  10 mg Oral Daily   enoxaparin  (LOVENOX ) injection  0.5 mg/kg Subcutaneous Q24H   escitalopram   10 mg Oral Daily   ferrous sulfate   325 mg Oral Q breakfast   folic acid   1 mg Oral Daily   insulin  aspart  0-15 Units Subcutaneous TID WC   insulin  aspart  0-5 Units Subcutaneous QHS   pantoprazole   40 mg Oral Daily   polyethylene glycol  17 g Oral Daily   sacubitril -valsartan   1 tablet Oral Q12H   sodium chloride  flush  10-40 mL Intracatheter Q12H   spironolactone   25 mg Oral Daily   thiamine   100 mg Oral Daily   traZODone   100 mg Oral QHS   Continuous Infusions:  ertapenem  1 g (01/19/24 1238)   PRN Meds: acetaminophen  **OR** acetaminophen , albuterol , artificial tears,  HYDROcodone -acetaminophen , loratadine , morphine  injection, nitroGLYCERIN , ondansetron  **OR** ondansetron  (ZOFRAN ) IV, sodium chloride  flush  Time spent: 40 minutes  Author: ELVAN SOR. MD [facility] 01/19/2024 3:16 PM  To reach On-call, see care teams to locate the attending and reach out to them via [email or web address]. If 7PM-7AM, please contact night-coverage If you still have difficulty reaching the attending provider, please page the [facility] (Director on Call) for [facility] on amion for assistance.

## 2024-01-19 NOTE — Progress Notes (Signed)
 Mobility Specialist - Progress Note   01/19/24 1511  Mobility  Activity Stood at bedside;Dangled on edge of bed  Level of Assistance Standby assist, set-up cues, supervision of patient - no hands on  Assistive Device Front wheel walker  Activity Response Tolerated well  Mobility visit 1 Mobility  Mobility Specialist Start Time (ACUTE ONLY) 1440  Mobility Specialist Stop Time (ACUTE ONLY) 1506  Mobility Specialist Time Calculation (min) (ACUTE ONLY) 26 min   Pt completed bed mob, dangled EOB and stood at bedside w/ close supervision for ~5-6 mins before returning seated. Pt denied amb attempts, expressing I can walk, just don't feel like it today. Pt becomes emotional as he details his last 6 weeks in and out of the hospital and rehab. Pt left seated EOB with alarm set and needs within reach.  America Silvan Mobility Specialist 01/19/24 3:13 PM

## 2024-01-19 NOTE — Consult Note (Signed)
 NAME: Manuel [name]  DOB: Aug 11, 1945  MRN: [id number]  Date/Time: 01/19/2024 2:09 PM  REQUESTING PROVIDER: Dr.Kumar Subjective:  REASON FOR CONSULT: endovascular graft infection ?pt was recently in the hospital between 8/14-9/02/2024 Manuel [name] is a 78 y.o. male with a history of PAD, Rt common femoral artery graft, right BKA, CAD, diabetes mellitus was recently at [facility] between 12/04/2023 until 01/01/2024 For ruptured right femoral artery leaking into the cutaneous tissue forming a hematoma.  He was taken for surgery and underwent excision of the old graft and portion of the femoral artery and had bypass graft with autologous external femoral artery.  Multiple cultures were taken and they were all Serratia.  He also had Serratia bacteremia.  He was treated with IV antibiotics and was sent to [facility] to complete 6 weeks of IV ertapenem  on 01/22/2024.  He was also sent on a wound VAC Patient saw his vascular surgeon on Friday 01/16/2024 and had complained that the wound VAC was not connected to the machine many days and it was never consistent-stent placed. So the vascular PA sent him to the ED to be hospitalized and also to evaluate the wound Patient did not have any fever or chills  01/16/24 15:44  BP 123/86  Temp 98.4 F (36.9 C)  Pulse Rate 72  Resp 16  SpO2 95 %    Latest Reference Range & Units 01/16/24 16:00  WBC 4.0 - 10.5 K/uL 9.5  Hemoglobin 13.0 - 17.0 g/dL 87.6 (L)  HCT 60.9 - 47.9 % 38.3 (L)  Platelets 150 - 400 K/uL 296  Creatinine 0.61 - 1.24 mg/dL 9.18   I am asked to see the patient for antibiotic management 12/04/2023 blood culture 2 sets Serratia 12/08/2023 2 sets of blood culture no growth 12/11/2023 wound culture, tissue culture was Serratia 12/28/2023 the culture was sent from the wound and that did not result until after his discharge and it was Candida glabrata and it was not treated.   Past Medical History:  Diagnosis Date   Acute ST elevation myocardial  infarction (STEMI) of inferior wall (HCC) 04/19/2010   a.) transfered from [facility] to [facility] --> LHC/PCI (very difficult procedure) --> 3.0 x 23 mm and 3.0 x 12 mm Xience stents to RCA   Allergies    Arthritis    Benign essential hypertension    Bilateral carotid artery disease 05/08/2021   a.) carotid doppler 05/08/2021: 1-39% BICA   CAD (coronary artery disease) 04/19/2010   a.) inferior STEMI 04/19/2010 --> LHC/PCI: 50-70% pD1, 80% pRI, 90/90/90% RCA (overlapping 3.0 x 23 and 3.0 x 12 mm Xience DES); b.) MV 11/10/2018: fixed minimally reversible inferior perfusion defect   Cellulitis of foot    Chronic HFrEF (heart failure with reduced ejection fraction) (HCC)    DDD (degenerative disc disease), cervical    Diabetes mellitus type 2, insulin  dependent (HCC)    Diverticulosis    Full dentures    Gout    Hard of hearing    History of bilateral cataract extraction 2022   History of ETOH abuse    Hyperlipidemia    Ischemic cardiomyopathy 04/19/2010   a.) TTE 04/19/2010: 40%; b.) TTE 04/20/2014: EF >55%; c.) TTE 11/10/2018: EF 45%; d.) TTE 11/14/2022: EF 25-30%; e. 06/2023 Echo: EF 25-30%, nl RV size/fxn. Mild MR. Mild-mod TR. Ao sclerosis w/o stenosis.   Long term current use of aspirin     Long term current use of clopidogrel     Lumbar degenerative disc disease  Lumbar radiculopathy    Lumbar vertebral fracture (chronic superior endplate of L1)    NSTEMI (non-ST elevated myocardial infarction) (HCC) 01/15/2023   OSA (obstructive sleep apnea)    a.) unable to tolerate nocturnal PAP therapy   Peripheral artery disease    a.) stenting 05/14/21: 12 mm x 12 cm LifeStent RIGHT dis SFA/prox pop; b.) s/p cath directed thrombolysis RIGHT SFA/pop 06/14/21; c.) s/p mech thrombectomy + stenting 06/15/21: 8 mm x 25cm & 8 mm x 7.5cm Viabahn; d.) s/p BILAT CFA, profunda femoris, SFA endarterectomies + fogarty embolectomy + stenting 11/15/22: 12mm x 58mm Lifestream BILAT CIAs, 14 mm x 6 cm Lifestream & 13 mm x  5 cm Viabahn LEFT EIA   Peripheral neuropathy    Umbilical hernia     Past Surgical History:  Procedure Laterality Date   AMPUTATION Right 11/18/2022   Procedure: AMPUTATION 4TH AND 5TH RAY;  Surgeon: [name], [name];  Location: [facility];  Service: Orthopedics/Podiatry;  Laterality: Right;  4th and 5th toe   AMPUTATION Right 04/20/2023   Procedure: AMPUTATION BELOW KNEE;  Surgeon: [name], MD;  Location: [facility];  Service: Vascular;  Laterality: Right;  block then general   APPLICATION OF WOUND VAC Right 01/09/2023   Procedure: APPLICATION OF WOUND VAC;  Surgeon: [name], MD;  Location: [facility];  Service: Vascular;  Laterality: Right;   APPLICATION OF WOUND VAC Right 12/12/2023   Procedure: APPLICATION, WOUND VAC;  Surgeon: [name], MD;  Location: [facility];  Service: Vascular;  Laterality: Right;   APPLICATION OF WOUND VAC Right 12/17/2023   Procedure: APPLICATION, WOUND VAC;  Surgeon: [name], MD;  Location: [facility];  Service: Vascular;  Laterality: Right;  EXCHANGE   APPLICATION OF WOUND VAC Right 12/23/2023   Procedure: APPLICATION, WOUND VAC;  Surgeon: [name], MD;  Location: [facility];  Service: General;  Laterality: Right;   APPLICATION OF WOUND VAC Right 12/11/2023   Procedure: APPLICATION, WOUND VAC;  Surgeon: [name], MD;  Location: [facility];  Service: Vascular;  Laterality: Right;   CATARACT EXTRACTION W/PHACO Right 03/14/2021   Procedure: CATARACT EXTRACTION PHACO AND INTRAOCULAR LENS PLACEMENT (IOC) RIGHT DIABETIC;  Surgeon: [name], MD;  Location: [facility];  Service: Ophthalmology;  Laterality: Right;  Diabetic 16.78 01:39.9   CATARACT EXTRACTION W/PHACO Left 03/28/2021   Procedure: CATARACT EXTRACTION PHACO AND INTRAOCULAR LENS PLACEMENT (IOC) LEFT DIABETIC 6.93 01:22.0;  Surgeon: [name], MD;  Location: [facility];  Service: Ophthalmology;  Laterality: Left;  Diabetic   COLONOSCOPY      CORONARY ANGIOPLASTY WITH STENT PLACEMENT  03/2010   Procedure: CORONARY ANGIOPLASTY WITH STENT PLACEMENT; Location: [name]   CORONARY STENT INTERVENTION N/A 07/14/2023   Procedure: CORONARY STENT INTERVENTION;  Surgeon: [name], MD;  Location: [facility];  Service: Cardiovascular;  Laterality: N/A;   ENDARTERECTOMY FEMORAL Bilateral 11/15/2022   Procedure: BILATERAL COMMON FEMORAL PROFUNDA FEMORIS AND SUPERFICIAL FEMORAL ARTERY ENDARTECTOMIES, RIGHT FOGARTY EMBOLECTOMY OF THE RIGHT SFA  AND  POPLITEAL ARTERIES. EZELLA AND RIGHT LOWER EXTREMITY ANGIOGRAM.;  Surgeon: [name], MD;  Location: [facility];  Service: Vascular;  Laterality: Bilateral;   ENDARTERECTOMY FEMORAL Right 12/11/2023   Procedure: ENDARTERECTOMY, FEMORAL;  Surgeon: [name], MD;  Location: [facility];  Service: Vascular;  Laterality: Right;  Re-do of femoral endarterectomy with sartorius flap   FEMORAL ARTERY EXPLORATION Right 12/12/2023   Procedure: EXPLORATION, ARTERY, FEMORAL- stop bleeding with suture repair  anastomaosis , abx beads;  Surgeon: [name], MD;  Location: [facility];  Service: Vascular;  Laterality: Right;   GROIN DEBRIDEMENT Right 12/28/2023   Procedure: DEBRIDEMENT, INGUINAL REGION;  Surgeon: Elnor Norleen CROME, MD;  Location: [facility];  Service: Vascular;  Laterality: Right;   HEMATOMA EVACUATION N/A 12/12/2023   Procedure: EVACUATION HEMATOMA;  Surgeon: [name], MD;  Location: [facility];  Service: Vascular;  Laterality: N/A;   INSERTION OF ILIAC STENT Bilateral 11/15/2022   Procedure: BILATERAL STENT INSERTION IN BILATERAL  COMMON ILIAC ARTERY, STENT INSERTION OF LEFT EXTERNAL ILIAC ARTERY. ANGIOPLASTY RIGHT TIBIAL  AND POPLITEAL ARTERY.;  Surgeon: [name], MD;  Location: [facility];  Service: Vascular;  Laterality: Bilateral;   IRRIGATION AND DEBRIDEMENT KNEE Right 04/21/2023   Procedure: IRRIGATION AND DEBRIDEMENT KNEE;  Surgeon: Zafonte, Brian Trevaughn, MD;  Location: [facility];  Service: Orthopedics;  Laterality: Right;   LOWER EXTREMITY ANGIOGRAPHY Right 05/14/2021   Procedure: LOWER EXTREMITY ANGIOGRAPHY;  Surgeon: [name], MD;  Location: [facility];  Service: Cardiovascular;  Laterality: Right;   LOWER EXTREMITY ANGIOGRAPHY Right 06/14/2021   Procedure: Lower Extremity Angiography;  Surgeon: [name], MD;  Location: [facility];  Service: Cardiovascular;  Laterality: Right;   LOWER EXTREMITY ANGIOGRAPHY Right 11/11/2022   Procedure: Lower Extremity Angiography;  Surgeon: [name], MD;  Location: [facility];  Service: Cardiovascular;  Laterality: Right;   LOWER EXTREMITY ANGIOGRAPHY Right 12/04/2023   Procedure: Lower Extremity Angiography;  Surgeon: [name], MD;  Location: [facility];  Service: Cardiovascular;  Laterality: Right;   LOWER EXTREMITY INTERVENTION Right 06/15/2021   Procedure: LOWER EXTREMITY INTERVENTION;  Surgeon: [name], MD;  Location: [facility];  Service: Cardiovascular;  Laterality: Right;   RIGHT/LEFT HEART CATH AND CORONARY ANGIOGRAPHY N/A 07/14/2023   Procedure: RIGHT/LEFT HEART CATH AND CORONARY ANGIOGRAPHY;  Surgeon: [name], MD;  Location: [facility];  Service: Cardiovascular;  Laterality: N/A;   TONSILLECTOMY     TRANSMETATARSAL AMPUTATION Right 02/17/2023   Procedure: TRANSMETATARSAL AMPUTATION;  Surgeon: Lennie Barter, [name];  Location: [facility];  Service: Orthopedics/Podiatry;  Laterality: Right;   WOUND DEBRIDEMENT Right 01/09/2023   Procedure: DEBRIDEMENT WOUND;  Surgeon: [name], MD;  Location: [facility];  Service: Vascular;  Laterality: Right;    Social History   Socioeconomic History   Marital status: Married    Spouse name: [name]   Number of children: 3   Years of education: Not on file   Highest education level: Not on file  Occupational History   Not on file  Tobacco Use   Smoking status: Never    Passive exposure:  Never   Smokeless tobacco: Never  Vaping Use   Vaping status: Never Used  Substance and Sexual Activity   Alcohol  use: Yes    Alcohol /week: 21.0 standard drinks of alcohol     Types: 7 Glasses of wine, 14 Cans of beer per week    Comment: occassional   Drug use: No   Sexual activity: Not on file  Other Topics Concern   Not on file  Social History Narrative   Not on file   Social Drivers of Health   Financial Resource Strain: Low Risk  (07/11/2023)   Overall Financial Resource Strain (CARDIA)    Difficulty of Paying Living Expenses: Not hard at all  Food Insecurity: No Food Insecurity (01/17/2024)   Hunger Vital Sign  Worried About Programme researcher, broadcasting/film/video in the Last Year: Never true    Ran Out of Food in the Last Year: Never true  Transportation Needs: No Transportation Needs (01/17/2024)   PRAPARE - Administrator, Civil Service (Medical): No    Lack of Transportation (Non-Medical): No  Physical Activity: Not on file  Stress: Not on file  Social Connections: Moderately Isolated (01/17/2024)   Social Connection and Isolation Panel    Frequency of Communication with Friends and Family: More than three times a week    Frequency of Social Gatherings with Friends and Family: More than three times a week    Attends Religious Services: Patient declined    Database administrator or Organizations: No    Attends Banker Meetings: Patient declined    Marital Status: Married  Catering manager Violence: Not At Risk (01/17/2024)   Humiliation, Afraid, Rape, and Kick questionnaire    Fear of Current or Ex-Partner: No    Emotionally Abused: No    Physically Abused: No    Sexually Abused: No    Family History  Problem Relation Age of Onset   Scoliosis Mother    Heart disease Father    No Known Allergies I? Current Facility-Administered Medications  Medication Dose Route Frequency Provider Last Rate Last Admin   acetaminophen  (TYLENOL ) tablet 650 mg  650 mg Oral  Q6H PRN Duncan, Hazel V, MD       Or   acetaminophen  (TYLENOL ) suppository 650 mg  650 mg Rectal Q6H PRN Duncan, Hazel V, MD       albuterol  (PROVENTIL ) (2.5 MG/3ML) 0.083% nebulizer solution 2.5 mg  2.5 mg Nebulization Q2H PRN Duncan, Hazel V, MD       artificial tears ophthalmic solution 1 drop  1 drop Both Eyes PRN Cleatus Delayne GAILS, MD       aspirin  EC tablet 81 mg  81 mg Oral Daily Duncan, Hazel V, MD   81 mg at 01/19/24 9167   atorvastatin  (LIPITOR ) tablet 80 mg  80 mg Oral QHS Duncan, Hazel V, MD   80 mg at 01/18/24 2139   carvedilol  (COREG ) tablet 3.125 mg  3.125 mg Oral BID WC Von Bellis, MD   3.125 mg at 01/19/24 9167   Chlorhexidine  Gluconate Cloth 2 % PADS 6 each  6 each Topical Daily Von Bellis, MD   6 each at 01/19/24 0834   clopidogrel  (PLAVIX ) tablet 75 mg  75 mg Oral Daily Duncan, Hazel V, MD   75 mg at 01/19/24 9167   dapagliflozin  propanediol (FARXIGA ) tablet 10 mg  10 mg Oral Daily Von Bellis, MD   10 mg at 01/19/24 9166   enoxaparin  (LOVENOX ) injection 45 mg  0.5 mg/kg Subcutaneous Q24H Duncan, Hazel V, MD   45 mg at 01/19/24 9166   ertapenem  (INVANZ ) 1 g in sodium chloride  0.9 % 100 mL IVPB  1 g Intravenous Q24H Duncan, Hazel V, MD 200 mL/hr at 01/19/24 1238 1 g at 01/19/24 1238   escitalopram  (LEXAPRO ) tablet 10 mg  10 mg Oral Daily Cleatus Delayne GAILS, MD   10 mg at 01/19/24 9167   ferrous sulfate  tablet 325 mg  325 mg Oral Q breakfast Duncan, Hazel V, MD   325 mg at 01/19/24 9166   folic acid  (FOLVITE ) tablet 1 mg  1 mg Oral Daily Duncan, Hazel V, MD   1 mg at 01/19/24 9166   HYDROcodone -acetaminophen  (NORCO/VICODIN) 5-325 MG per tablet 1-2 tablet  1-2  tablet Oral Q4H PRN Cleatus Delayne GAILS, MD   2 tablet at 01/18/24 2139   insulin  aspart (novoLOG ) injection 0-15 Units  0-15 Units Subcutaneous TID WC Cleatus Delayne GAILS, MD   2 Units at 01/19/24 1241   insulin  aspart (novoLOG ) injection 0-5 Units  0-5 Units Subcutaneous QHS Cleatus Delayne GAILS, MD       loratadine  (CLARITIN )  tablet 10 mg  10 mg Oral Daily PRN Cleatus Delayne GAILS, MD       morphine  (PF) 2 MG/ML injection 2 mg  2 mg Intravenous Q2H PRN Cleatus Delayne GAILS, MD       nitroGLYCERIN  (NITROSTAT ) SL tablet 0.4 mg  0.4 mg Sublingual Q5 min PRN Cleatus Delayne GAILS, MD       ondansetron  (ZOFRAN ) tablet 4 mg  4 mg Oral Q6H PRN Cleatus Delayne GAILS, MD       Or   ondansetron  (ZOFRAN ) injection 4 mg  4 mg Intravenous Q6H PRN Cleatus Delayne GAILS, MD       pantoprazole  (PROTONIX ) EC tablet 40 mg  40 mg Oral Daily Cleatus Delayne GAILS, MD   40 mg at 01/19/24 9167   polyethylene glycol (MIRALAX  / GLYCOLAX ) packet 17 g  17 g Oral Daily Cleatus Delayne GAILS, MD   17 g at 01/19/24 9166   sacubitril -valsartan  (ENTRESTO ) 24-26 mg per tablet  1 tablet Oral Q12H Von Bellis, MD   1 tablet at 01/19/24 9166   sodium chloride  flush (NS) 0.9 % injection 10-40 mL  10-40 mL Intracatheter Q12H Von Bellis, MD   10 mL at 01/19/24 9164   sodium chloride  flush (NS) 0.9 % injection 10-40 mL  10-40 mL Intracatheter PRN Von Bellis, MD       spironolactone  (ALDACTONE ) tablet 25 mg  25 mg Oral Daily Von Bellis, MD   25 mg at 01/19/24 9167   thiamine  (VITAMIN B1) tablet 100 mg  100 mg Oral Daily Cleatus Delayne GAILS, MD   100 mg at 01/19/24 9167   traZODone  (DESYREL ) tablet 100 mg  100 mg Oral QHS Cleatus Delayne GAILS, MD   100 mg at 01/18/24 2139     Abtx:  Anti-infectives (From admission, onward)    Start     Dose/Rate Route Frequency Ordered Stop   01/17/24 1100  ertapenem  (INVANZ ) 1 g in sodium chloride  0.9 % 100 mL IVPB        1 g 200 mL/hr over 30 Minutes Intravenous Every 24 hours 01/17/24 0255     01/17/24 0115  ertapenem  (INVANZ ) IVPB  Status:  Discontinued        1 g Intravenous Every 24 hours 01/17/24 0105 01/17/24 0254       REVIEW OF SYSTEMS:  Const: negative fever, negative chills, negative weight loss Eyes: negative diplopia or visual changes, negative eye pain ENT: negative coryza, negative sore throat Resp: negative cough, hemoptysis,  dyspnea Cards: negative for chest pain, palpitations, lower extremity edema GU: negative for frequency, dysuria and hematuria GI: Negative for abdominal pain, diarrhea, bleeding, constipation Skin: negative for rash and pruritus Heme: negative for easy bruising and gum/nose bleeding MS: negative for myalgias, arthralgias, back pain and muscle weakness Neurolo:negative for headaches, dizziness, vertigo, memory problems  Psych: negative for feelings of anxiety, depression  Endocrine: , diabetes Allergy/Immunology- negative for any medication or food allergies ?  Objective:  VITALS:  BP (!) 156/86 (BP Location: Left Arm)   Pulse 75   Temp 98.2 F (36.8 C)   Resp 18  Ht 5' 8 (1.727 m)   Wt 90.7 kg   SpO2 99%   BMI 30.41 kg/m  LDA Right PICC line site looks clean PHYSICAL EXAM:  General: Alert, cooperative, no distress, appears stated age.  Head: Normocephalic, without obvious abnormality, atraumatic. Eyes: Conjunctivae clear, anicteric sclerae. Pupils are equal ENT Nares normal. No drainage or sinus tenderness. Lips, mucosa, and tongue normal. No Thrush Neck: Supple, symmetrical, no adenopathy, thyroid: non tender no carotid bruit and no JVD. Back: No CVA tenderness. Lungs: Clear to auscultation bilaterally. No Wheezing or Rhonchi. No rales. Heart: Regular rate and rhythm, no murmur, rub or gallop. Abdomen: Soft, non-tender,not distended. Bowel sounds normal. No masses Extremities: Right groin wound is covered with black sponge and connected to the wound VAC  skin: Some abrasions over the left knee and shin Rt BKA  lymph: Cervical, supraclavicular normal. Neurologic: Grossly non-focal Pertinent Labs Lab Results CBC    Component Value Date/Time   WBC 6.9 01/19/2024 0321   RBC 4.01 (L) 01/19/2024 0321   HGB 11.4 (L) 01/19/2024 0321   HGB 15.7 02/16/2012 2013   HCT 34.1 (L) 01/19/2024 0321   HCT 44.4 02/16/2012 2013   PLT 253 01/19/2024 0321   PLT 176 02/16/2012  2013   MCV 85.0 01/19/2024 0321   MCV 91 02/16/2012 2013   MCH 28.4 01/19/2024 0321   MCHC 33.4 01/19/2024 0321   RDW 14.5 01/19/2024 0321   RDW 13.0 02/16/2012 2013   LYMPHSABS 0.5 (L) 12/04/2023 1141   MONOABS 0.5 12/04/2023 1141   EOSABS 0.1 12/04/2023 1141   BASOSABS 0.0 12/04/2023 1141       Latest Ref Rng & Units 01/19/2024    3:21 AM 01/18/2024    5:50 AM 01/16/2024    4:00 PM  CMP  Glucose 70 - 99 mg/dL 858  825  718   BUN 8 - 23 mg/dL 21  24  29    Creatinine 0.61 - 1.24 mg/dL 9.12  9.31  9.18   Sodium 135 - 145 mmol/L 137  137  133   Potassium 3.5 - 5.1 mmol/L 4.1  3.8  4.1   Chloride 98 - 111 mmol/L 105  103  98   CO2 22 - 32 mmol/L 26  25  23    Calcium  8.9 - 10.3 mg/dL 9.0  8.9  8.7       Microbiology: No results found for this or any previous visit (from the past 240 hours).  Lines and Device Date on insertion # of days DC  Central line     Foley     ETT       IMAGING RESULTS: Chest x-ray right PICC line with tip in the mid SVC years Cardiomegaly Old right rib deformity from healed fracture Low lung volumes with vascular crowding I have personally reviewed the films ? Impression/Recommendation ? Recent Serratia bacteremia source was the right groin?infected bovine graft.  Status post removal of the graft on 12/11/2023.  Culture was Serratia Repeat blood culture from 8/18 was negative for Serratia  Patient has been on ertapenem  and will receive 6 weeks of IV antibiotics until 01/22/2024 from the day of the graft removal which was on 12/11/2023  Right common femoral artery graft with rupture and leaking into the cutaneous tissue forming a hematoma.  Was taken for surgery on 12/11/2023 and underwent resection of the infected common femoral artery, infected common femoral artery stents As well as infected profunda femoris artery stents.  He had right external iliac  to profunda femoris artery bypass with autologous superficial femoral artery conduit.  And  placement of wound VAC on 12/11/2023._ Culture of the tissue was Serratia from 12/11/2023 Patient is currently on ertapenem  and he will complete 6 weeks of antibiotics on 01/22/2024.  The plan is not to extend the antibiotic unless the wound tomorrow on examination is not pressing as it should be.    Repeat wound culture that was sent on 12/28/2023 had Candida glabrata This has not been treated So tomorrow when the wound VAC is being changed we need to assess the wound and repeat the culture from a deep source  Patient is in the hospital because of concern of the wound VAC not being connected to the machine every day  Anemia has improved from last admission when he had received multiple PRBCs   AKI is resolved  Diabetes mellitus management as per primary team  This consult involve complex antimicrobial management. _I have personally spent  -60--minutes involved in face-to-face and non-face-to-face activities for this patient on the day of the visit. Professional time spent includes the following activities: Preparing to see the patient (review of tests), Obtaining and/or reviewing separately obtained history (admission/discharge record), Performing a medically appropriate examination and/or evaluation , Ordering medications/tests/procedures, referring and communicating with other health care professionals, Documenting clinical information in the EMR, Independently interpreting results (not separately reported), Communicating results to the patient/ Counseling and educating the patient/ and Care coordination (not separately reported).    ________________________________________________  Note:  This document was prepared using Conservation officer, historic buildings and may include unintentional dictation errors.

## 2024-01-20 DIAGNOSIS — T148XXD Other injury of unspecified body region, subsequent encounter: Secondary | ICD-10-CM | POA: Diagnosis not present

## 2024-01-20 DIAGNOSIS — Z9889 Other specified postprocedural states: Secondary | ICD-10-CM

## 2024-01-20 DIAGNOSIS — Z48812 Encounter for surgical aftercare following surgery on the circulatory system: Secondary | ICD-10-CM

## 2024-01-20 DIAGNOSIS — T827XXD Infection and inflammatory reaction due to other cardiac and vascular devices, implants and grafts, subsequent encounter: Secondary | ICD-10-CM | POA: Diagnosis not present

## 2024-01-20 DIAGNOSIS — B9689 Other specified bacterial agents as the cause of diseases classified elsewhere: Secondary | ICD-10-CM | POA: Diagnosis not present

## 2024-01-20 DIAGNOSIS — R7881 Bacteremia: Secondary | ICD-10-CM | POA: Diagnosis not present

## 2024-01-20 DIAGNOSIS — D649 Anemia, unspecified: Secondary | ICD-10-CM | POA: Diagnosis not present

## 2024-01-20 LAB — MAGNESIUM: Magnesium: 2 mg/dL (ref 1.7–2.4)

## 2024-01-20 LAB — GLUCOSE, CAPILLARY
Glucose-Capillary: 123 mg/dL — ABNORMAL HIGH (ref 70–99)
Glucose-Capillary: 111 mg/dL — ABNORMAL HIGH (ref 70–99)
Glucose-Capillary: 214 mg/dL — ABNORMAL HIGH (ref 70–99)
Glucose-Capillary: 107 mg/dL — ABNORMAL HIGH (ref 70–99)
Glucose-Capillary: 140 mg/dL — ABNORMAL HIGH (ref 70–99)

## 2024-01-20 LAB — BASIC METABOLIC PANEL WITH GFR
Anion gap: 8 (ref 5–15)
BUN: 23 mg/dL (ref 8–23)
CO2: 25 mmol/L (ref 22–32)
Calcium: 9.1 mg/dL (ref 8.9–10.3)
Chloride: 103 mmol/L (ref 98–111)
Creatinine, Ser: 0.76 mg/dL (ref 0.61–1.24)
GFR, Estimated: >60 mL/min (ref >60)
Glucose, Bld: 132 mg/dL — ABNORMAL HIGH (ref 70–99)
Potassium: 4 mmol/L (ref 3.5–5.1)
Sodium: 136 mmol/L (ref 135–145)

## 2024-01-20 LAB — CBC
HCT: 35.6 % — ABNORMAL LOW (ref 39.0–52.0)
Hemoglobin: 11.9 g/dL — ABNORMAL LOW (ref 13.0–17.0)
MCH: 28.3 pg (ref 26.0–34.0)
MCHC: 33.4 g/dL (ref 30.0–36.0)
MCV: 84.8 fL (ref 80.0–100.0)
Platelets: 256 K/uL (ref 150–400)
RBC: 4.2 MIL/uL — ABNORMAL LOW (ref 4.22–5.81)
RDW: 14.2 % (ref 11.5–15.5)
WBC: 7.9 K/uL (ref 4.0–10.5)
nRBC: 0 % (ref 0.0–0.2)

## 2024-01-20 LAB — PHOSPHORUS: Phosphorus: 3.5 mg/dL (ref 2.5–4.6)

## 2024-01-20 NOTE — Progress Notes (Signed)
 Date of Admission:  01/16/2024     ID: Manuel [name] is a 78 y.o. male  Principal Problem:   Delayed wound healing Active Problems:   Coronary artery disease with history of myocardial infarction without history of CABG   Chronic systolic CHF (congestive heart failure) (HCC)   Uncontrolled type 2 diabetes mellitus with hyperglycemia, with long-term current use of insulin  (HCC)   Wound dehiscence, right groin   Long term (current) use of antibiotics    Subjective: Patient is doing well He had a wound VAC change today   Medications:   aspirin  EC  81 mg Oral Daily   atorvastatin   80 mg Oral QHS   carvedilol   3.125 mg Oral BID WC   Chlorhexidine  Gluconate Cloth  6 each Topical Daily   clopidogrel   75 mg Oral Daily   dapagliflozin  propanediol  10 mg Oral Daily   enoxaparin  (LOVENOX ) injection  0.5 mg/kg Subcutaneous Q24H   escitalopram   10 mg Oral Daily   ferrous sulfate   325 mg Oral Q breakfast   folic acid   1 mg Oral Daily   insulin  aspart  0-15 Units Subcutaneous TID WC   insulin  aspart  0-5 Units Subcutaneous QHS   pantoprazole   40 mg Oral Daily   polyethylene glycol  17 g Oral Daily   sacubitril -valsartan   1 tablet Oral Q12H   sodium chloride  flush  10-40 mL Intracatheter Q12H   spironolactone   25 mg Oral Daily   thiamine   100 mg Oral Daily   traZODone   100 mg Oral QHS    Objective: Vital signs in last 24 hours: Patient Vitals for the past 24 hrs:  BP Temp Temp src Pulse Resp SpO2  01/20/24 0746 (!) 148/91 99.1 F (37.3 C) -- 73 16 92 %  01/20/24 0415 138/80 97.6 F (36.4 C) -- 73 19 96 %  01/19/24 1954 (!) 152/81 98.4 F (36.9 C) Oral 76 18 98 %     PHYSICAL EXAM:  General: Alert, cooperative, no distress, appears stated age.  Lungs: Clear to auscultation bilaterally. No Wheezing or Rhonchi. No rales. Heart: Regular rate and rhythm, no murmur, rub or gallop. Abdomen: Soft, non-tender,not distended. Bowel sounds normal. No masses Extremities: Right  inguinal area picture reviewed   Mostly there is granulation tissue except for couple of areas of yellowish stuff not sure whether it is slough or purulence Will need to evaluate this wound personally during next wound change and may do a culture if needed Skin: No rashes or lesions. Or bruising Lymph: Cervical, supraclavicular normal. Neurologic: Grossly non-focal  Lab Results    Latest Ref Rng & Units 01/20/2024    4:27 AM 01/19/2024    3:21 AM 01/18/2024    5:50 AM  CBC  WBC 4.0 - 10.5 K/uL 7.9  6.9  6.3   Hemoglobin 13.0 - 17.0 g/dL 88.0  88.5  88.6   Hematocrit 39.0 - 52.0 % 35.6  34.1  34.0   Platelets 150 - 400 K/uL 256  253  253        Latest Ref Rng & Units 01/20/2024    4:27 AM 01/19/2024    3:21 AM 01/18/2024    5:50 AM  CMP  Glucose 70 - 99 mg/dL 867  858  825   BUN 8 - 23 mg/dL 23  21  24    Creatinine 0.61 - 1.24 mg/dL 9.23  9.12  9.31   Sodium 135 - 145 mmol/L 136  137  137  Potassium 3.5 - 5.1 mmol/L 4.0  4.1  3.8   Chloride 98 - 111 mmol/L 103  105  103   CO2 22 - 32 mmol/L 25  26  25    Calcium  8.9 - 10.3 mg/dL 9.1  9.0  8.9       Microbiology: None    Assessment/Plan: Recent Serratia bacteremia.  Source was the right groin.  He had infection bovine graft Status Post removal of the endovascular graft on 12/11/2023.  The culture was also Serratia.  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  He then had a right external iliac to profunda femoris artery bypass with autoLog us  superficial femoral artery conduit.  And had a wound VAC placed on 12/11/2023  Culture of the tissue was Serratia. Patient is currently on ertapenem  and will complete 6 weeks on 01/22/2024.  A repeat culture was taken from the wound on 12/28/2023 by the vascular surgeon and it grew Candida glabrata.  This resulted after he was discharged from the hospital.  This has not been treated. The wound looks very healthy except for a couple of areas of yellow stuff If this is purulence then we may culture it and and treat accordingly  Patient is in the hospital because of concern of wound VAC not being connected to the machine every day at [facility]  Anemia has improved since the last admission.  Next  AKI is resolved  Diabetes mellitus  Discussed the management with the patient Will discuss with the wound care consultant

## 2024-01-20 NOTE — Plan of Care (Signed)
  Problem: Fluid Volume: Goal: Ability to maintain a balanced intake and output will improve Outcome: Progressing   Problem: Metabolic: Goal: Ability to maintain appropriate glucose levels will improve Outcome: Progressing   Problem: Education: Goal: Knowledge of General Education information will improve Description: Including pain rating scale, medication(s)/side effects and non-pharmacologic comfort measures Outcome: Progressing   Problem: Pain Managment: Goal: General experience of comfort will improve and/or be controlled Outcome: Progressing   Problem: Skin Integrity: Goal: Risk for impaired skin integrity will decrease Outcome: Progressing

## 2024-01-20 NOTE — Consult Note (Addendum)
 WOC Nurse wound follow up Vac dressing changed to right groin.  Full thickness healing post-op wound is 95% red, 5% yellow, 10X4X1cm.  There is a significant crease located in the groin fold.  Applied barrier ring around wound edges and into the crease, then one piece black foam to 100 mm cont suction.  Pt was medicated for pain prior to the procedure and tolerated with minimal amt discomfort,  small amt tan drainage in the cannister.   WOC team will plan to change again on Thursday if patient is still in the hospital at that time, then Pt can begin Mon/Thurs dressing changes next week.   Thank-you,  Stephane Fought MSN, RN, CWOCN, CWCN-AP, CNS Contact Mon-Fri 0700-1500: [phone]

## 2024-01-20 NOTE — Progress Notes (Signed)
 [facility] Progress Note  Patient: Manuel [name]    FMW:[id number]  DOA: 01/16/2024     Date of Service: the patient was seen and examined on 01/20/2024  Chief Complaint  Patient presents with   Wound Check    Wound vac unable to get a seal and is not working properly    Brief hospital course: Manuel [name] is a 78 y.o. male with medical history significant for DM, HTN, OSA,  HFrEF 25-30%,  CAD s/p multiple stents, extensive PAD/carotid artery stenosis with history of revascularization, most recently stent placement right femoral artery complicated by femoral artery rupture/repair/wound dehiscence/serratia infection currently on wound VAC and ertapenem  via PICC, sent in from vascular clinic for wound VAC change due to delayed healing and difficulty maintaining a seal at his facility.  Per review of outpatient note, he is to return to the OR early next week for wound washout .  Patient has pain in the area but otherwise denies complaints In the ED, vitals unremarkable.  Labs unremarkable except for elevated blood sugar of 281. Patient treated with morphine  for pain. Admission requested    Assessment and Plan:  # Delayed wound healing # Serratia wound infection # Wound dehiscence right groin s/p repair of posterior and vascular stent/femoral artery rupture # Long-term antibiotics-via PICC Vascular surgery consulted, recommended no surgical intervention and continue wound VAC.   Continue antibiotics-ertapenem  till 10/2 as planned previously PRN meds for Pain control 9/29 ID consulted, continue antibiotics till 10/2, patient will be reevaluated on 9/30.  Follow ID for duration of antibiotics. Final Rec?     # Chronic systolic CHF (congestive heart failure) (HCC) EF 20 to 25% History of NSVT Clinically euvolemic Continue home GDMT of carvedilol , spironolactone , Entresto  and Farxiga  9/27 hypotensive episode, NS to 50 mL bolus given, BP is stable now Monitor BP and titrate  medications accordingly   # Uncontrolled type 2 diabetes mellitus with hyperglycemia, with long-term current use of insulin  (HCC) Blood glucose elevated to 281 Continue home basal insulin  with sliding scale coverage     # History of alcohol  abuse On thiamine    # Sleep apnea with nocturnal hypoxia Prn O2    Body mass index is 30.41 kg/m.  Interventions:   Diet: Carb modified/heart rate with diet DVT Prophylaxis: Subcutaneous Lovenox    Advance goals of care discussion: DNR-limited  Family Communication: family was not present at bedside, at the time of interview.  The pt provided permission to discuss medical plan with the family. Opportunity was given to ask question and all questions were answered satisfactorily.   Disposition:  Pt is from SNF, admitted with nonhealing wound and SNF unable to manage wound VAC, as per vascular surgery patient does not need surgical intervention, continue antibiotics and wound VAC.  ID following.  Need final ID recommendation for antibiotic duration.  Patient does not want to go to SNF and he would like to go home with home services in couple of days once his mobility will improve.  Discharge to home with home health services when cleared by vascular and ID most likely in 1 to 2 days.  Subjective: No significant events overnight.  Patient was sitting comfortably on the recliner, seen after redressing and changing of the wound VAC.  Patient tolerated procedure well, denied any pain.  Patient is trying to move around with the help of walker but is still feels weak.  Patient is thinking that in couple of days his strength will improve and he will  be able to go home with home health services.    Physical Exam: General: NAD, lying comfortably Appear in no distress, affect appropriate Eyes: PERRLA ENT: Oral Mucosa Clear, moist  Neck: no JVD,  Cardiovascular: S1 and S2 Present, no Murmur,  Respiratory: good respiratory effort, Bilateral Air entry  equal and Decreased, no Crackles, no wheezes Abdomen: Bowel Sound present, Soft and no tenderness,  Skin: no rashes Extremities: s/p right BKA, right groin wound VAC attached, no Pedal edema, no calf tenderness Chane Neurologic: without any new focal findings Gait not checked due to patient safety concerns  Vitals:   01/19/24 1954 01/20/24 0415 01/20/24 0746 01/20/24 1652  BP: (!) 152/81 138/80 (!) 148/91 (!) 142/88  Pulse: 76 73 73 65  Resp: 18 19 16 15   Temp: 98.4 F (36.9 C) 97.6 F (36.4 C) 99.1 F (37.3 C) 98 F (36.7 C)  TempSrc: Oral     SpO2: 98% 96% 92% 100%  Weight:      Height:        Intake/Output Summary (Last 24 hours) at 01/20/2024 1725 Last data filed at 01/20/2024 1300 Gross per 24 hour  Intake 920 ml  Output 1075 ml  Net -155 ml   Filed Weights   01/16/24 1546  Weight: 90.7 kg    Data Reviewed: I have personally reviewed and interpreted daily labs, tele strips, imagings as discussed above. I reviewed all nursing notes, pharmacy notes, vitals, pertinent old records I have discussed plan of care as described above with RN and patient/family.  CBC: Recent Labs  Lab 01/16/24 1600 01/18/24 0550 01/19/24 0321 01/20/24 0427  WBC 9.5 6.3 6.9 7.9  HGB 12.3* 11.3* 11.4* 11.9*  HCT 38.3* 34.0* 34.1* 35.6*  MCV 87.8 85.4 85.0 84.8  PLT 296 253 253 256   Basic Metabolic Panel: Recent Labs  Lab 01/16/24 1600 01/18/24 0550 01/19/24 0321 01/20/24 0427  NA 133* 137 137 136  K 4.1 3.8 4.1 4.0  CL 98 103 105 103  CO2 23 25 26 25   GLUCOSE 281* 174* 141* 132*  BUN 29* 24* 21 23  CREATININE 0.81 0.68 0.87 0.76  CALCIUM  8.7* 8.9 9.0 9.1  MG  --  2.1 1.9 2.0  PHOS  --  3.5 3.3 3.5    Studies: No results found.   Scheduled Meds:  aspirin  EC  81 mg Oral Daily   atorvastatin   80 mg Oral QHS   carvedilol   3.125 mg Oral BID WC   Chlorhexidine  Gluconate Cloth  6 each Topical Daily   clopidogrel   75 mg Oral Daily   dapagliflozin  propanediol  10 mg  Oral Daily   enoxaparin  (LOVENOX ) injection  0.5 mg/kg Subcutaneous Q24H   escitalopram   10 mg Oral Daily   ferrous sulfate   325 mg Oral Q breakfast   folic acid   1 mg Oral Daily   insulin  aspart  0-15 Units Subcutaneous TID WC   insulin  aspart  0-5 Units Subcutaneous QHS   pantoprazole   40 mg Oral Daily   polyethylene glycol  17 g Oral Daily   sacubitril -valsartan   1 tablet Oral Q12H   sodium chloride  flush  10-40 mL Intracatheter Q12H   spironolactone   25 mg Oral Daily   thiamine   100 mg Oral Daily   traZODone   100 mg Oral QHS   Continuous Infusions:  ertapenem  1 g (01/20/24 1158)   PRN Meds: acetaminophen  **OR** acetaminophen , albuterol , artificial tears, HYDROcodone -acetaminophen , loratadine , morphine  injection, nitroGLYCERIN , ondansetron  **OR** ondansetron  (ZOFRAN ) IV, sodium chloride   flush  Time spent: 40 minutes  Author: ELVAN SOR. MD [facility] 01/20/2024 5:25 PM  To reach On-call, see care teams to locate the attending and reach out to them via [email or web address]. If 7PM-7AM, please contact night-coverage If you still have difficulty reaching the attending provider, please page the [facility] (Director on Call) for [facility] on amion for assistance.

## 2024-01-20 NOTE — Progress Notes (Signed)
 Progress Note    01/20/2024 11:30 AM * No surgery found *  Subjective:  Manuel [name] is an 78 yo male who presented to the emergency department after being seen in vein and vascular clinic last Friday on 01/16/2024.  Patient was having difficulties with the facility he was staying in being able to handle his need for a wound VAC.  Patient currently has a wound VAC to his right groin for postoperative infection to control hemorrhage to his right femoral.  Patient also continues on IV antibiotics as he has been getting them for 6 weeks outpatient.  Patient wife at the bedside this morning reports that the wound care nurse was in and changed the wound VAC.  Wound care nurse recommendations are for the patient to continue the wound VAC at this time.  Patient was loaded into media files of the wound prior to applying a new wound VAC today.  No signs or symptoms of infection.  Beefy red healthy tissue is growing in the wound bed.  No other complaints overnight.  Wound VAC is sealed well vitals all remained stable.   Vitals:   01/20/24 0415 01/20/24 0746  BP: 138/80 (!) 148/91  Pulse: 73 73  Resp: 19 16  Temp: 97.6 F (36.4 C) 99.1 F (37.3 C)  SpO2: 96% 92%   Physical Exam: Cardiac:  RRR, normal S1 and S2.  No murmurs appreciated. Lungs: Nonlabored breathing, clear on auscultation throughout, no rales rhonchi or wheezing. Incisions: Right groin postoperative surgical incision with wound VAC placement.  Picture of wound media files today.  No signs or symptoms of infection Thoma or seroma no. Extremities: Patient with a history of right BKA.  Left lower extremity warm to touch with palpable pulses. Abdomen: Positive bowel sounds throughout, soft, nontender nondistended. Neurologic: Alert and oriented x 3, answers questions follows commands appropriately.  CBC    Component Value Date/Time   WBC 7.9 01/20/2024 0427   RBC 4.20 (L) 01/20/2024 0427   HGB 11.9 (L) 01/20/2024 0427   HGB 15.7  02/16/2012 2013   HCT 35.6 (L) 01/20/2024 0427   HCT 44.4 02/16/2012 2013   PLT 256 01/20/2024 0427   PLT 176 02/16/2012 2013   MCV 84.8 01/20/2024 0427   MCV 91 02/16/2012 2013   MCH 28.3 01/20/2024 0427   MCHC 33.4 01/20/2024 0427   RDW 14.2 01/20/2024 0427   RDW 13.0 02/16/2012 2013   LYMPHSABS 0.5 (L) 12/04/2023 1141   MONOABS 0.5 12/04/2023 1141   EOSABS 0.1 12/04/2023 1141   BASOSABS 0.0 12/04/2023 1141    BMET    Component Value Date/Time   NA 136 01/20/2024 0427   NA 137 07/21/2023 1246   NA 137 02/16/2012 2013   K 4.0 01/20/2024 0427   K 3.8 02/16/2012 2013   CL 103 01/20/2024 0427   CL 102 02/16/2012 2013   CO2 25 01/20/2024 0427   CO2 25 02/16/2012 2013   GLUCOSE 132 (H) 01/20/2024 0427   GLUCOSE 154 (H) 02/16/2012 2013   BUN 23 01/20/2024 0427   BUN 30 (H) 07/21/2023 1246   BUN 10 02/16/2012 2013   CREATININE 0.76 01/20/2024 0427   CREATININE 0.72 02/16/2012 2013   CALCIUM  9.1 01/20/2024 0427   CALCIUM  9.6 02/16/2012 2013   GFRNONAA >60 01/20/2024 0427   GFRNONAA >60 02/16/2012 2013   GFRAA >60 12/09/2018 0929   GFRAA >60 02/16/2012 2013    INR    Component Value Date/Time   INR 1.3 (H) 12/12/2023  9183     Intake/Output Summary (Last 24 hours) at 01/20/2024 1130 Last data filed at 01/20/2024 1020 Gross per 24 hour  Intake 720 ml  Output 1075 ml  Net -355 ml     Assessment/Plan:  78 y.o. male is s/p right femoral endarterectomy hemorrhage with surgical repair and wound VAC placement.* No surgery found *   PLAN Working on patient placement for a facility that can handle wound VAC changes on Monday and Thursday twice a week as well as provide the patient is long-term IV antibiotics.  Once this is completed patient okay per vascular surgery to be discharged.  Patient will follow-up with vein and vascular surgery as scheduled afterwards. Currently the patient's can have pain medication as needed, advance diet as tolerated, and he can work with  physical therapy and Occupational Therapy at this time.  DVT prophylaxis: ASA 81 mg daily, Plavix  75 mg daily, and Lipitor  40 mg daily.   [name] [facility] 01/20/2024 11:30 AM

## 2024-01-21 DIAGNOSIS — D649 Anemia, unspecified: Secondary | ICD-10-CM | POA: Diagnosis not present

## 2024-01-21 DIAGNOSIS — B372 Candidiasis of skin and nail: Secondary | ICD-10-CM

## 2024-01-21 DIAGNOSIS — B9689 Other specified bacterial agents as the cause of diseases classified elsewhere: Secondary | ICD-10-CM | POA: Diagnosis not present

## 2024-01-21 DIAGNOSIS — R7881 Bacteremia: Secondary | ICD-10-CM | POA: Diagnosis not present

## 2024-01-21 DIAGNOSIS — T827XXD Infection and inflammatory reaction due to other cardiac and vascular devices, implants and grafts, subsequent encounter: Secondary | ICD-10-CM | POA: Diagnosis not present

## 2024-01-21 DIAGNOSIS — T148XXD Other injury of unspecified body region, subsequent encounter: Secondary | ICD-10-CM | POA: Diagnosis not present

## 2024-01-21 LAB — BASIC METABOLIC PANEL WITH GFR
Anion gap: 8 (ref 5–15)
BUN: 26 mg/dL — ABNORMAL HIGH (ref 8–23)
CO2: 25 mmol/L (ref 22–32)
Calcium: 8.9 mg/dL (ref 8.9–10.3)
Chloride: 103 mmol/L (ref 98–111)
Creatinine, Ser: 0.67 mg/dL (ref 0.61–1.24)
GFR, Estimated: >60 mL/min (ref >60)
Glucose, Bld: 147 mg/dL — ABNORMAL HIGH (ref 70–99)
Potassium: 4.2 mmol/L (ref 3.5–5.1)
Sodium: 136 mmol/L (ref 135–145)

## 2024-01-21 LAB — CBC
HCT: 33.1 % — ABNORMAL LOW (ref 39.0–52.0)
Hemoglobin: 11.1 g/dL — ABNORMAL LOW (ref 13.0–17.0)
MCH: 28.8 pg (ref 26.0–34.0)
MCHC: 33.5 g/dL (ref 30.0–36.0)
MCV: 85.8 fL (ref 80.0–100.0)
Platelets: 228 K/uL (ref 150–400)
RBC: 3.86 MIL/uL — ABNORMAL LOW (ref 4.22–5.81)
RDW: 14.4 % (ref 11.5–15.5)
WBC: 7.6 K/uL (ref 4.0–10.5)
nRBC: 0 % (ref 0.0–0.2)

## 2024-01-21 LAB — GLUCOSE, CAPILLARY
Glucose-Capillary: 130 mg/dL — ABNORMAL HIGH (ref 70–99)
Glucose-Capillary: 185 mg/dL — ABNORMAL HIGH (ref 70–99)
Glucose-Capillary: 136 mg/dL — ABNORMAL HIGH (ref 70–99)
Glucose-Capillary: 143 mg/dL — ABNORMAL HIGH (ref 70–99)

## 2024-01-21 LAB — SEDIMENTATION RATE: Sed Rate: 8 mm/h (ref 0–20)

## 2024-01-21 LAB — C-REACTIVE PROTEIN: CRP: 0.7 mg/dL (ref <1.0)

## 2024-01-21 MED ORDER — GABAPENTIN 100 MG PO CAPS
100.0000 mg | ORAL_CAPSULE | Freq: Two times a day (BID) | ORAL | Status: DC
  Administered 2024-01-21 – 2024-01-26 (×10): 100 mg via ORAL
  Filled 2024-01-21 (×10): qty 1

## 2024-01-21 NOTE — Plan of Care (Signed)
  Problem: Fluid Volume: Goal: Ability to maintain a balanced intake and output will improve Outcome: Progressing   Problem: Metabolic: Goal: Ability to maintain appropriate glucose levels will improve Outcome: Progressing   Problem: Clinical Measurements: Goal: Ability to maintain clinical measurements within normal limits will improve Outcome: Progressing   Problem: Activity: Goal: Risk for activity intolerance will decrease Outcome: Progressing   Problem: Pain Managment: Goal: General experience of comfort will improve and/or be controlled Outcome: Progressing   Problem: Safety: Goal: Ability to remain free from injury will improve Outcome: Progressing   Problem: Skin Integrity: Goal: Risk for impaired skin integrity will decrease Outcome: Progressing

## 2024-01-21 NOTE — Evaluation (Signed)
 Physical Therapy Evaluation Patient Details Name: Manuel [name] MRN: [id number] DOB: 15-Dec-1945 Today's Date: 01/21/2024  History of Present Illness  Pt is an 78 y/o M admitted on 01/16/24 after presenting from wound clinic for evaluation of delayed wound healing & wound vac difficulty maintaining a seal. PMH: DM, HTN, OSA, HFrEF, CAD s/p multiple stents, extensive PAD/carotid artery stenosis with hx of revascularization  Clinical Impression  Pt seen for PT evaluation with pt initially declining participation, then agreeable to tx. Pt reports prior to RLE issues he was living with his wife in a home with ramped entry, has been ambulating with crutches or RW & RLE prosthesis but also has a w/c he can use at home. Pt is hopeful to d/c home vs SNF rehab. Pt is able to complete bed mobility with supervision, sit>stand with CGA, & ambulate into hallway with RW & CGA with fair balance. Pt is mobilizing fairly well. Will continue to follow pt acutely to progress mobility as able.        If plan is discharge home, recommend the following: A little help with walking and/or transfers;A little help with bathing/dressing/bathroom;Assistance with cooking/housework;Assist for transportation;Help with stairs or ramp for entrance   Can travel by private vehicle        Equipment Recommendations None recommended by PT  Recommendations for Other Services       Functional Status Assessment Patient has had a recent decline in their functional status and demonstrates the ability to make significant improvements in function in a reasonable and predictable amount of time.     Precautions / Restrictions Precautions Precautions: Fall Precaution/Restrictions Comments: RLE prosthesis, R groin wound vac Restrictions Weight Bearing Restrictions Per Provider Order: No      Mobility  Bed Mobility Overal bed mobility: Needs Assistance Bed Mobility: Supine to Sit     Supine to sit: Supervision, HOB elevated,  Used rails (exited R side of bed)          Transfers Overall transfer level: Needs assistance Equipment used: Rolling walker (2 wheels) Transfers: Sit to/from Stand Sit to Stand: Contact guard assist           General transfer comment: posterior lean, pulling RW backwards despite pushing to standing with 1UE, extra time to come to standing EOB    Ambulation/Gait Ambulation/Gait assistance: Contact guard assist Gait Distance (Feet): 40 Feet Assistive device: Rolling walker (2 wheels) Gait Pattern/deviations: Decreased step length - right, Decreased step length - left, Decreased stride length, Step-through pattern Gait velocity: decreased        Stairs            Wheelchair Mobility     Tilt Bed    Modified Rankin (Stroke Patients Only)       Balance Overall balance assessment: Needs assistance Sitting-balance support: Feet supported, No upper extremity supported Sitting balance-Leahy Scale: Good     Standing balance support: Bilateral upper extremity supported, Reliant on assistive device for balance, During functional activity Standing balance-Leahy Scale: Fair                               Pertinent Vitals/Pain Pain Assessment Pain Assessment: No/denies pain    Home Living Family/patient expects to be discharged to:: Private residence Living Arrangements: Spouse/significant other Available Help at Discharge: Family;Available PRN/intermittently;Personal care attendant (wife works part time, pt home alone 3-4 hours when pt's wife working) Type of Home: House Home Access: Ramped entrance  Alternate Level Stairs-Number of Steps: 15 Home Layout: Able to live on main level with bedroom/bathroom;Two level;Laundry or work area in [employer] Equipment: Agricultural consultant (2 wheels);BSC/3in1;Wheelchair - manual;Cane - single point;Tub bench;Crutches Additional Comments: RLE prosthesis, Reports he had a PCA prior to admission.    Prior  Function               Mobility Comments: Pt reports he was ambulatory with RW or crutches with RLE prosthesis.       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, R groin wound vac intact throughout session       Communication   Communication Communication: Impaired Factors Affecting Communication: Hearing impaired    Cognition Arousal: Alert Behavior During Therapy: WFL for tasks assessed/performed   PT - Cognitive impairments: No apparent impairments                         Following commands: Intact       Cueing Cueing Techniques: Verbal cues     General Comments      Exercises     Assessment/Plan    PT Assessment Patient needs continued PT services  PT Problem List Decreased strength;Decreased activity tolerance;Decreased range of motion;Decreased balance;Decreased mobility;Decreased knowledge of use of DME;Decreased skin integrity       PT Treatment Interventions DME instruction;Gait training;Functional mobility training;Therapeutic activities;Therapeutic exercise;Balance training;Patient/family education;Wheelchair mobility training;Stair training;Neuromuscular re-education    PT Goals (Current goals can be found in the Care Plan section)  Acute Rehab PT Goals Patient Stated Goal: go home, R wound to heal PT Goal Formulation: With patient Time For Goal Achievement: 02/04/24 Potential to Achieve Goals: Good    Frequency Min 2X/week     Co-evaluation               AM-PAC PT 6 Clicks Mobility  Outcome Measure Help needed turning from your back to your side while in a flat bed without using bedrails?: None Help needed moving from lying on your back to sitting on the side of a flat bed without using bedrails?: A  Little Help needed moving to and from a bed to a chair (including a wheelchair)?: A Little Help needed standing up from a chair using your arms (e.g., wheelchair or bedside chair)?: A Little Help needed to walk in hospital room?: A Little Help needed climbing 3-5 steps with a railing? : A Lot 6 Click Score: 18    End of Session   Activity Tolerance: Patient tolerated treatment well Patient left: in bed;with call bell/phone within reach Nurse Communication: Mobility status PT Visit Diagnosis: Muscle weakness (generalized) (M62.81);Other abnormalities of gait and mobility (R26.89)    Time: 8681-8657 PT Time Calculation (min) (ACUTE ONLY): 24 min   Charges:   PT Evaluation $PT Eval Low Complexity: 1 Low   PT General Charges $$ ACUTE PT VISIT: 1 Visit         Richerd Pinal, PT, DPT 01/21/24, 1:50 PM   Richerd CHRISTELLA Pinal 01/21/2024, 1:50 PM

## 2024-01-21 NOTE — Progress Notes (Signed)
 PROGRESS NOTE    Manuel [name]  FMW:[id number] DOB: 02-14-1946 DOA: 01/16/2024 PCP: [name], [name], MD  Chief Complaint  Patient presents with   Wound Check    Wound vac unable to get a seal and is not working properly     Hospital Course:  Manuel [name] is an 78 year old male with DM, hypertension, OSA, heart failure reduced EF 25 to 30%, CAD status post multiple stents, extensive PAD, carotid artery stenosis with history of revascularization, had recent stent placement to the right femoral artery complicated by femoral artery rupture/repair/wound dehiscence/Serratia infection.  Patient was recently discharged to skilled nursing facility with a wound VAC and ertapenem  via PICC.  He was sent in from the vascular clinic for wound VAC change due to delayed wound healing and difficulty maintaining wound Vac seal at his SNF.  In the ED labs and also are mostly unremarkable.  Vascular surgery was consulted on arrival.  Subjective: Patient reports pain is well-controlled at this time.  He is hopeful that he can return home with home health instead of back to a skilled nursing facility.  Reports he misses his dog Manuel [name]   Objective: Vitals:   01/20/24 1652 01/20/24 1948 01/21/24 0331 01/21/24 0802  BP: (!) 142/88 122/72 121/60 (!) 146/80  Pulse: 65 61 72 76  Resp: 15 17 19 18   Temp: 98 F (36.7 C) 98.1 F (36.7 C) 98 F (36.7 C) 98.3 F (36.8 C)  TempSrc:   Oral   SpO2: 100% 98% 93% 99%  Weight:      Height:        Intake/Output Summary (Last 24 hours) at 01/21/2024 1424 Last data filed at 01/21/2024 1300 Gross per 24 hour  Intake 820 ml  Output 850 ml  Net -30 ml   Filed Weights   01/16/24 1546  Weight: 90.7 kg    Examination: General exam: Appears calm and comfortable, NAD  Respiratory system: No work of breathing, symmetric chest wall expansion Cardiovascular system: S1 & S2 heard, RRR.  Gastrointestinal system: Abdomen is nondistended, soft and nontender.   Neuro: Alert and oriented. No focal neurological deficits. Extremities: s/p r bka, wound vac over right groin, no oozing  Assessment & Plan:  Principal Problem:   Delayed wound healing Active Problems:   Coronary artery disease with history of myocardial infarction without history of CABG   Chronic systolic CHF (congestive heart failure) (HCC)   Uncontrolled type 2 diabetes mellitus with hyperglycemia, with long-term current use of insulin  (HCC)   Wound dehiscence, right groin   Long term (current) use of antibiotics    Delayed wound healing Serratia wound infection Wound dehiscence right groin status post repair of posterior vascular stent Femoral artery rupture Long-term antibiotics via PICC - Vascular surgery consulted, recommends wound VAC changes on Monday and Thursdays - On ertapenem  through 10/2 - Planning for repeat culture at wound VAC change tomorrow - Infectious disease consulted, appreciate final antibiotic recommendations - As needed pain meds - PT/OT consulted - Hopefully patient can return home with home health if no longer needing IV antibiotic therapy  Chronic systolic CHF, EF 7974% History of NSVT - Currently euvolemic - Continue home GDMT, titrate as needed  Uncontrolled type 2 diabetes with hyperglycemia with long-term use of insulin  - Blood glucose elevated on arrival - Continue basal/bolus and sliding scale.  Titrate as needed  History of alcohol  abuse - Continue thiamine   Sleep apnea with nocturnal hypoxia - As needed oxygen at night  BMI  30 Obesity class - Outpatient follow up for lifestyle modification and risk factor management  CAD - Continue home meds   DVT prophylaxis: Lovenox    Code Status: Limited: Do not attempt resuscitation (DNR) -DNR-LIMITED -Do Not Intubate/DNI  Disposition:  Inpatient pending final arrangements, HH vs SNF at DC.   Consultants:    Procedures:    Antimicrobials:  Anti-infectives (From admission,  onward)    Start     Dose/Rate Route Frequency Ordered Stop   01/17/24 1100  ertapenem  (INVANZ ) 1 g in sodium chloride  0.9 % 100 mL IVPB        1 g 200 mL/hr over 30 Minutes Intravenous Every 24 hours 01/17/24 0255     01/17/24 0115  ertapenem  (INVANZ ) IVPB  Status:  Discontinued        1 g Intravenous Every 24 hours 01/17/24 0105 01/17/24 0254       Data Reviewed: I have personally reviewed following labs and imaging studies CBC: Recent Labs  Lab 01/16/24 1600 01/18/24 0550 01/19/24 0321 01/20/24 0427 01/21/24 0333  WBC 9.5 6.3 6.9 7.9 7.6  HGB 12.3* 11.3* 11.4* 11.9* 11.1*  HCT 38.3* 34.0* 34.1* 35.6* 33.1*  MCV 87.8 85.4 85.0 84.8 85.8  PLT 296 253 253 256 228   Basic Metabolic Panel: Recent Labs  Lab 01/16/24 1600 01/18/24 0550 01/19/24 0321 01/20/24 0427 01/21/24 0333  NA 133* 137 137 136 136  K 4.1 3.8 4.1 4.0 4.2  CL 98 103 105 103 103  CO2 23 25 26 25 25   GLUCOSE 281* 174* 141* 132* 147*  BUN 29* 24* 21 23 26*  CREATININE 0.81 0.68 0.87 0.76 0.67  CALCIUM  8.7* 8.9 9.0 9.1 8.9  MG  --  2.1 1.9 2.0  --   PHOS  --  3.5 3.3 3.5  --    GFR: Estimated Creatinine Clearance: 80.5 mL/min (by C-G formula based on SCr of 0.67 mg/dL). Liver Function Tests: No results for input(s): AST, ALT, ALKPHOS, BILITOT, PROT, ALBUMIN in the last 168 hours. CBG: Recent Labs  Lab 01/20/24 1146 01/20/24 1621 01/20/24 2154 01/21/24 0800 01/21/24 1154  GLUCAP 214* 107* 140* 130* 185*    Recent Results (from the past 240 hours)  MRSA Next Gen by PCR, Nasal     Status: None   Collection Time: 01/19/24  9:29 PM   Specimen: Nasal Mucosa; Nasal Swab  Result Value Ref Range Status   MRSA by PCR Next Gen NOT DETECTED NOT DETECTED Final    Comment: (NOTE) The GeneXpert MRSA Assay (FDA approved for NASAL specimens only), is one component of a comprehensive MRSA colonization surveillance program. It is not intended to diagnose MRSA infection nor to guide or monitor  treatment for MRSA infections. Test performance is not FDA approved in patients less than 18 years old. Performed at [facility], [address], [place], [place] [address]      Radiology Studies: No results found.  Scheduled Meds:  aspirin  EC  81 mg Oral Daily   atorvastatin   80 mg Oral QHS   carvedilol   3.125 mg Oral BID WC   Chlorhexidine  Gluconate Cloth  6 each Topical Daily   clopidogrel   75 mg Oral Daily   dapagliflozin  propanediol  10 mg Oral Daily   enoxaparin  (LOVENOX ) injection  0.5 mg/kg Subcutaneous Q24H   escitalopram   10 mg Oral Daily   ferrous sulfate   325 mg Oral Q breakfast   folic acid   1 mg Oral Daily   insulin  aspart  0-15 Units Subcutaneous TID WC   insulin  aspart  0-5 Units Subcutaneous QHS   pantoprazole   40 mg Oral Daily   polyethylene glycol  17 g Oral Daily   sacubitril -valsartan   1 tablet Oral Q12H   sodium chloride  flush  10-40 mL Intracatheter Q12H   spironolactone   25 mg Oral Daily   thiamine   100 mg Oral Daily   traZODone   100 mg Oral QHS   Continuous Infusions:  ertapenem  1 g (01/21/24 1013)     LOS: 0 days  MDM: Patient is high risk for one or more organ failure.  They necessitate ongoing hospitalization for continued IV therapies and subsequent lab monitoring. Total time spent interpreting labs and vitals, reviewing the medical record, coordinating care amongst consultants and care team members, directly assessing and discussing care with the patient and/or family: 55 min  Tycen Dockter, DO [facility]  To contact the attending physician between 7A-7P please use Epic Chat. To contact the covering physician during after hours 7P-7A, please review Amion.  01/21/2024, 2:24 PM   *This document has been created with the assistance of dictation software. Please excuse typographical errors. *

## 2024-01-21 NOTE — Progress Notes (Signed)
 Progress Note    01/21/2024 10:46 AM * No surgery found *  Subjective:   Manuel [name] is an 78 yo male who presented to the emergency department after being seen in vein and vascular clinic last Friday on 01/16/2024.  Patient was having difficulties with the facility he was staying in being able to handle his need for a wound VAC.  Patient currently has a wound VAC to his right groin for postoperative infection to control hemorrhage to his right femoral.  Patient also continues on IV antibiotics as he has been getting them for 6 weeks outpatient.   Patient wife at the bedside this morning reports that the wound care nurse was in and changed the wound VAC yesterday morning (01/20/24).  Wound care nurse recommendations are for the patient to continue the wound VAC at this time.  Patient was loaded into media files of the wound prior to applying a new wound VAC today.  No signs or symptoms of infection.  Beefy red healthy tissue is growing in the wound bed.  No other complaints overnight.  Wound VAC is sealed well vitals all remained stable.   Vitals:   01/21/24 0331 01/21/24 0802  BP: 121/60 (!) 146/80  Pulse: 72 76  Resp: 19 18  Temp: 98 F (36.7 C) 98.3 F (36.8 C)  SpO2: 93% 99%   Physical Exam: Cardiac:  RRR, normal S1 and S2.  No murmurs appreciated. Lungs: Nonlabored breathing, clear on auscultation throughout, no rales rhonchi or wheezing. Incisions: Right groin postoperative surgical incision with wound VAC placement.  Picture of wound media files today.  No signs or symptoms of infection hematoma or seroma. Extremities: Patient with a history of right BKA.  Left lower extremity warm to touch with palpable pulses. Abdomen: Positive bowel sounds throughout, soft, nontender nondistended. Neurologic: Alert and oriented x 3, answers questions follows commands appropriately.  CBC    Component Value Date/Time   WBC 7.6 01/21/2024 0333   RBC 3.86 (L) 01/21/2024 0333   HGB 11.1 (L)  01/21/2024 0333   HGB 15.7 02/16/2012 2013   HCT 33.1 (L) 01/21/2024 0333   HCT 44.4 02/16/2012 2013   PLT 228 01/21/2024 0333   PLT 176 02/16/2012 2013   MCV 85.8 01/21/2024 0333   MCV 91 02/16/2012 2013   MCH 28.8 01/21/2024 0333   MCHC 33.5 01/21/2024 0333   RDW 14.4 01/21/2024 0333   RDW 13.0 02/16/2012 2013   LYMPHSABS 0.5 (L) 12/04/2023 1141   MONOABS 0.5 12/04/2023 1141   EOSABS 0.1 12/04/2023 1141   BASOSABS 0.0 12/04/2023 1141    BMET    Component Value Date/Time   NA 136 01/21/2024 0333   NA 137 07/21/2023 1246   NA 137 02/16/2012 2013   K 4.2 01/21/2024 0333   K 3.8 02/16/2012 2013   CL 103 01/21/2024 0333   CL 102 02/16/2012 2013   CO2 25 01/21/2024 0333   CO2 25 02/16/2012 2013   GLUCOSE 147 (H) 01/21/2024 0333   GLUCOSE 154 (H) 02/16/2012 2013   BUN 26 (H) 01/21/2024 0333   BUN 30 (H) 07/21/2023 1246   BUN 10 02/16/2012 2013   CREATININE 0.67 01/21/2024 0333   CREATININE 0.72 02/16/2012 2013   CALCIUM  8.9 01/21/2024 0333   CALCIUM  9.6 02/16/2012 2013   GFRNONAA >60 01/21/2024 0333   GFRNONAA >60 02/16/2012 2013   GFRAA >60 12/09/2018 0929   GFRAA >60 02/16/2012 2013    INR    Component Value Date/Time  INR 1.3 (H) 12/12/2023 0816     Intake/Output Summary (Last 24 hours) at 01/21/2024 1046 Last data filed at 01/21/2024 0900 Gross per 24 hour  Intake 1020 ml  Output 450 ml  Net 570 ml     Assessment/Plan:  78 y.o. male is s/p right femoral endarterectomy hemorrhage with surgical repair and wound VAC placement.  * No surgery found *   PLAN Working on patient placement for a facility that can handle wound VAC changes on Monday and Thursday twice a week as well as provide the patient is long-term IV antibiotics.  Once this is completed patient okay per vascular surgery to be discharged.  Patient will follow-up with vein and vascular surgery as scheduled afterwards. Currently the patient's can have pain medication as needed, advance diet as  tolerated, and he can work with physical therapy and Occupational Therapy at this time.   DVT prophylaxis: ASA 81 mg daily, Plavix  75 mg daily, and Lipitor  40 mg daily.   Manuel [name] [facility] 01/21/2024 10:46 AM

## 2024-01-21 NOTE — Progress Notes (Signed)
 Date of Admission:  01/16/2024     ID: Manuel [name] is a 78 y.o. male  Principal Problem:   Delayed wound healing Active Problems:   Coronary artery disease with history of myocardial infarction without history of CABG   Chronic systolic CHF (congestive heart failure) (HCC)   Uncontrolled type 2 diabetes mellitus with hyperglycemia, with long-term current use of insulin  (HCC)   Wound dehiscence, right groin   Long term (current) use of antibiotics    Subjective: Patient is doing well    Medications:   aspirin  EC  81 mg Oral Daily   atorvastatin   80 mg Oral QHS   carvedilol   3.125 mg Oral BID WC   Chlorhexidine  Gluconate Cloth  6 each Topical Daily   clopidogrel   75 mg Oral Daily   dapagliflozin  propanediol  10 mg Oral Daily   enoxaparin  (LOVENOX ) injection  0.5 mg/kg Subcutaneous Q24H   escitalopram   10 mg Oral Daily   ferrous sulfate   325 mg Oral Q breakfast   folic acid   1 mg Oral Daily   insulin  aspart  0-15 Units Subcutaneous TID WC   insulin  aspart  0-5 Units Subcutaneous QHS   pantoprazole   40 mg Oral Daily   polyethylene glycol  17 g Oral Daily   sacubitril -valsartan   1 tablet Oral Q12H   sodium chloride  flush  10-40 mL Intracatheter Q12H   spironolactone   25 mg Oral Daily   thiamine   100 mg Oral Daily   traZODone   100 mg Oral QHS    Objective: Vital signs in last 24 hours: Patient Vitals for the past 24 hrs:  BP Temp Temp src Pulse Resp SpO2  01/21/24 0802 (!) 146/80 98.3 F (36.8 C) -- 76 18 99 %  01/21/24 0331 121/60 98 F (36.7 C) Oral 72 19 93 %  01/20/24 1948 122/72 98.1 F (36.7 C) -- 61 17 98 %  01/20/24 1652 (!) 142/88 98 F (36.7 C) -- 65 15 100 %     PHYSICAL EXAM:  General: Alert, cooperative, no distress, appears stated age.  Lungs: Clear to auscultation bilaterally. No Wheezing or Rhonchi. No rales. Heart: Regular rate and rhythm, no murmur, rub or gallop. Abdomen: Soft, non-tender,not distended. Bowel sounds normal. No  masses Extremities: Right inguinal area picture reviewed   Mostly there is granulation tissue except for couple of areas of yellowish stuff not sure whether it is slough or purulence Will need to evaluate this wound personally during next wound change and may do a culture if needed Skin: No rashes or lesions. Or bruising Lymph: Cervical, supraclavicular normal. Neurologic: Grossly non-focal  Lab Results    Latest Ref Rng & Units 01/21/2024    3:33 AM 01/20/2024    4:27 AM 01/19/2024    3:21 AM  CBC  WBC 4.0 - 10.5 K/uL 7.6  7.9  6.9   Hemoglobin 13.0 - 17.0 g/dL 88.8  88.0  88.5   Hematocrit 39.0 - 52.0 % 33.1  35.6  34.1   Platelets 150 - 400 K/uL 228  256  253        Latest Ref Rng & Units 01/21/2024    3:33 AM 01/20/2024    4:27 AM 01/19/2024    3:21 AM  CMP  Glucose 70 - 99 mg/dL 852  867  858   BUN 8 - 23 mg/dL 26  23  21    Creatinine 0.61 - 1.24 mg/dL 9.32  9.23  9.12   Sodium 135 - 145  mmol/L 136  136  137   Potassium 3.5 - 5.1 mmol/L 4.2  4.0  4.1   Chloride 98 - 111 mmol/L 103  103  105   CO2 22 - 32 mmol/L 25  25  26    Calcium  8.9 - 10.3 mg/dL 8.9  9.1  9.0       Microbiology: None    Assessment/Plan: Recent Serratia bacteremia.  Source was the right groin.  Manuel [name] had infection bovine graft Status Post removal of the endovascular graft on 12/11/2023.  The culture was also Serratia.  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  Manuel [name] then had a right external iliac to profunda femoris artery bypass with autoLog us  superficial femoral artery conduit.  And had a wound VAC placed on 12/11/2023  Culture of the tissue was Serratia. Patient is currently on ertapenem  and will complete 6 weeks on 01/22/2024.  A repeat culture was taken from the wound on 12/28/2023 by the vascular surgeon and it grew Candida glabrata.  This resulted after Manuel [name] was discharged from the hospital.  This has not been treated. The wound looks very healthy except for a couple of areas of yellow stuff If this is purulence then we may culture it and and treat accordingly  Patient is in the hospital because of concern of wound VAC not being connected to the machine every day at [facility]  Anemia has improved since the last admission.  Next  AKI is resolved  Diabetes mellitus  Discussed the management with the patient

## 2024-01-22 DIAGNOSIS — R7881 Bacteremia: Secondary | ICD-10-CM | POA: Diagnosis not present

## 2024-01-22 DIAGNOSIS — D649 Anemia, unspecified: Secondary | ICD-10-CM | POA: Diagnosis not present

## 2024-01-22 DIAGNOSIS — T827XXD Infection and inflammatory reaction due to other cardiac and vascular devices, implants and grafts, subsequent encounter: Secondary | ICD-10-CM | POA: Diagnosis not present

## 2024-01-22 DIAGNOSIS — T148XXD Other injury of unspecified body region, subsequent encounter: Secondary | ICD-10-CM | POA: Diagnosis not present

## 2024-01-22 DIAGNOSIS — B9689 Other specified bacterial agents as the cause of diseases classified elsewhere: Secondary | ICD-10-CM | POA: Diagnosis not present

## 2024-01-22 LAB — CBC
HCT: 38.9 % — ABNORMAL LOW (ref 39.0–52.0)
Hemoglobin: 12.8 g/dL — ABNORMAL LOW (ref 13.0–17.0)
MCH: 28.4 pg (ref 26.0–34.0)
MCHC: 32.9 g/dL (ref 30.0–36.0)
MCV: 86.3 fL (ref 80.0–100.0)
Platelets: 264 K/uL (ref 150–400)
RBC: 4.51 MIL/uL (ref 4.22–5.81)
RDW: 14.2 % (ref 11.5–15.5)
WBC: 7 K/uL (ref 4.0–10.5)
nRBC: 0 % (ref 0.0–0.2)

## 2024-01-22 LAB — BASIC METABOLIC PANEL WITH GFR
Anion gap: 8 (ref 5–15)
BUN: 29 mg/dL — ABNORMAL HIGH (ref 8–23)
CO2: 27 mmol/L (ref 22–32)
Calcium: 9.4 mg/dL (ref 8.9–10.3)
Chloride: 101 mmol/L (ref 98–111)
Creatinine, Ser: 0.66 mg/dL (ref 0.61–1.24)
GFR, Estimated: >60 mL/min (ref >60)
Glucose, Bld: 107 mg/dL — ABNORMAL HIGH (ref 70–99)
Potassium: 4.1 mmol/L (ref 3.5–5.1)
Sodium: 136 mmol/L (ref 135–145)

## 2024-01-22 LAB — GLUCOSE, CAPILLARY
Glucose-Capillary: 128 mg/dL — ABNORMAL HIGH (ref 70–99)
Glucose-Capillary: 168 mg/dL — ABNORMAL HIGH (ref 70–99)
Glucose-Capillary: 160 mg/dL — ABNORMAL HIGH (ref 70–99)

## 2024-01-22 MED ORDER — FLUCONAZOLE 100 MG PO TABS
200.0000 mg | ORAL_TABLET | Freq: Every day | ORAL | Status: DC
  Administered 2024-01-22 – 2024-01-26 (×5): 200 mg via ORAL
  Filled 2024-01-22 (×5): qty 2

## 2024-01-22 NOTE — Progress Notes (Signed)
 PROGRESS NOTE    Manuel [name]  FMW:[id number] DOB: 1946-04-22 DOA: 01/16/2024 PCP: [name], [name], MD  Chief Complaint  Patient presents with   Wound Check    Wound vac unable to get a seal and is not working properly     Hospital Course:  Manuel [name] is an 78 year old male with DM, hypertension, OSA, heart failure reduced EF 25 to 30%, CAD status post multiple stents, extensive PAD, carotid artery stenosis with history of revascularization, had recent stent placement to the right femoral artery complicated by femoral artery rupture/repair/wound dehiscence/Serratia infection.  Patient was recently discharged to skilled nursing facility with a wound VAC and ertapenem  via PICC.  He was sent in from the vascular clinic for wound VAC change due to delayed wound healing and difficulty maintaining wound Vac seal at his SNF.  In the ED labs and also are mostly unremarkable.  Vascular surgery was consulted on arrival.  Subjective: Patient reports he had wound VAC exchange this morning and his pain is well-controlled.  He has no acute complaints at this time.  He is anxious to return home.  Objective: Vitals:   01/21/24 1646 01/21/24 2038 01/22/24 0406 01/22/24 0738  BP: (!) 155/95 (!) 148/79 127/65 (!) 158/83  Pulse: 77 73 71 72  Resp: 19 18 17 15   Temp: 98.1 F (36.7 C) 99 F (37.2 C) 97.8 F (36.6 C) 98.4 F (36.9 C)  TempSrc: Oral Oral Oral Oral  SpO2: 100% 98% 94% 95%  Weight:      Height:        Intake/Output Summary (Last 24 hours) at 01/22/2024 1512 Last data filed at 01/22/2024 1218 Gross per 24 hour  Intake 960 ml  Output 600 ml  Net 360 ml   Filed Weights   01/16/24 1546  Weight: 90.7 kg    Examination: General exam: Appears calm and comfortable, NAD  Respiratory system: No work of breathing, symmetric chest wall expansion Cardiovascular system: S1 & S2 heard, RRR.  Gastrointestinal system: Abdomen is nondistended, soft and nontender.  Neuro: Alert and  oriented. No focal neurological deficits. Extremities: s/p r bka, wound vac over right groin, no oozing  Assessment & Plan:  Principal Problem:   Delayed wound healing Active Problems:   Coronary artery disease with history of myocardial infarction without history of CABG   Chronic systolic CHF (congestive heart failure) (HCC)   Uncontrolled type 2 diabetes mellitus with hyperglycemia, with long-term current use of insulin  (HCC)   Wound dehiscence, right groin   Long term (current) use of antibiotics    Delayed wound healing Serratia wound infection Wound dehiscence right groin status post repair of posterior vascular stent Femoral artery rupture Long-term antibiotics via PICC - Vascular surgery consulted, recommends wound VAC changes on Monday and Thursdays - On ertapenem  through 10/2, Diflucan added today.  Will plan to continue with ertapenem  until discharge at which time we will transition to short course of Bactrim  to complete an additional 7 days through 10/9 - Repeat wound culture taken at wound VAC change today - Infectious diseases following - Planning for home health at discharge. - As needed pain meds - PT/OT consulted  Chronic systolic CHF, EF 7974% History of NSVT - Currently euvolemic - Continue home GDMT, titrate as needed  Uncontrolled type 2 diabetes with hyperglycemia with long-term use of insulin  - Blood glucose elevated on arrival - Continue basal/bolus and sliding scale.  Titrate as needed  History of alcohol  abuse - Continue thiamine   Sleep apnea with nocturnal hypoxia - As needed oxygen at night  BMI 30 Obesity class - Outpatient follow up for lifestyle modification and risk factor management  CAD - Continue home meds   DVT prophylaxis: Lovenox    Code Status: Limited: Do not attempt resuscitation (DNR) -DNR-LIMITED -Do Not Intubate/DNI  Disposition: Planning for home health at discharge.  TOC consulted to help with arrangements.  Pending  wound VAC delivery and set up  Consultants:    Procedures:    Antimicrobials:  Anti-infectives (From admission, onward)    Start     Dose/Rate Route Frequency Ordered Stop   01/22/24 1000  fluconazole (DIFLUCAN) tablet 200 mg        200 mg Oral Daily 01/22/24 0836     01/17/24 1100  ertapenem  (INVANZ ) 1 g in sodium chloride  0.9 % 100 mL IVPB        1 g 200 mL/hr over 30 Minutes Intravenous Every 24 hours 01/17/24 0255     01/17/24 0115  ertapenem  (INVANZ ) IVPB  Status:  Discontinued        1 g Intravenous Every 24 hours 01/17/24 0105 01/17/24 0254       Data Reviewed: I have personally reviewed following labs and imaging studies CBC: Recent Labs  Lab 01/18/24 0550 01/19/24 0321 01/20/24 0427 01/21/24 0333 01/22/24 0230  WBC 6.3 6.9 7.9 7.6 7.0  HGB 11.3* 11.4* 11.9* 11.1* 12.8*  HCT 34.0* 34.1* 35.6* 33.1* 38.9*  MCV 85.4 85.0 84.8 85.8 86.3  PLT 253 253 256 228 264   Basic Metabolic Panel: Recent Labs  Lab 01/18/24 0550 01/19/24 0321 01/20/24 0427 01/21/24 0333 01/22/24 0230  NA 137 137 136 136 136  K 3.8 4.1 4.0 4.2 4.1  CL 103 105 103 103 101  CO2 25 26 25 25 27   GLUCOSE 174* 141* 132* 147* 107*  BUN 24* 21 23 26* 29*  CREATININE 0.68 0.87 0.76 0.67 0.66  CALCIUM  8.9 9.0 9.1 8.9 9.4  MG 2.1 1.9 2.0  --   --   PHOS 3.5 3.3 3.5  --   --    GFR: Estimated Creatinine Clearance: 80.5 mL/min (by C-G formula based on SCr of 0.66 mg/dL). Liver Function Tests: No results for input(s): AST, ALT, ALKPHOS, BILITOT, PROT, ALBUMIN in the last 168 hours. CBG: Recent Labs  Lab 01/21/24 1154 01/21/24 1631 01/21/24 2140 01/22/24 0744 01/22/24 1144  GLUCAP 185* 136* 143* 128* 168*    Recent Results (from the past 240 hours)  MRSA Next Gen by PCR, Nasal     Status: None   Collection Time: 01/19/24  9:29 PM   Specimen: Nasal Mucosa; Nasal Swab  Result Value Ref Range Status   MRSA by PCR Next Gen NOT DETECTED NOT DETECTED Final    Comment:  (NOTE) The GeneXpert MRSA Assay (FDA approved for NASAL specimens only), is one component of a comprehensive MRSA colonization surveillance program. It is not intended to diagnose MRSA infection nor to guide or monitor treatment for MRSA infections. Test performance is not FDA approved in patients less than 75 years old. Performed at [facility], [address], [place], [place] [address]      Radiology Studies: No results found.  Scheduled Meds:  aspirin  EC  81 mg Oral Daily   atorvastatin   80 mg Oral QHS   carvedilol   3.125 mg Oral BID WC   Chlorhexidine  Gluconate Cloth  6 each Topical Daily   clopidogrel   75 mg Oral Daily  dapagliflozin  propanediol  10 mg Oral Daily   enoxaparin  (LOVENOX ) injection  0.5 mg/kg Subcutaneous Q24H   escitalopram   10 mg Oral Daily   ferrous sulfate   325 mg Oral Q breakfast   fluconazole  200 mg Oral Daily   folic acid   1 mg Oral Daily   gabapentin   100 mg Oral BID   insulin  aspart  0-15 Units Subcutaneous TID WC   insulin  aspart  0-5 Units Subcutaneous QHS   pantoprazole   40 mg Oral Daily   polyethylene glycol  17 g Oral Daily   sacubitril -valsartan   1 tablet Oral Q12H   sodium chloride  flush  10-40 mL Intracatheter Q12H   spironolactone   25 mg Oral Daily   thiamine   100 mg Oral Daily   traZODone   100 mg Oral QHS   Continuous Infusions:  ertapenem  1 g (01/22/24 1158)     LOS: 0 days  MDM: Patient is high risk for one or more organ failure.  They necessitate ongoing hospitalization for continued IV therapies and subsequent lab monitoring. Total time spent interpreting labs and vitals, reviewing the medical record, coordinating care amongst consultants and care team members, directly assessing and discussing care with the patient and/or family: 55 min  Kyren Vaux, DO [facility]  To contact the attending physician between 7A-7P please use Epic Chat. To contact the covering physician during after hours 7P-7A, please  review Amion.  01/22/2024, 3:12 PM   *This document has been created with the assistance of dictation software. Please excuse typographical errors. *

## 2024-01-22 NOTE — Consult Note (Addendum)
 WOC Nurse wound follow up Vac dressing changed to right groin.  Dr [name] at the bedside to assess wound appearance and she obtained a wound culture. Full thickness healing post-op wound is 95% red, 5% yellow fibrinous tissue adhered over the center. Small amt pink drainage. Refer to previous notes for measurements. There is a significant crease located in the groin fold.  Applied barrier ring around wound edges and into the crease, then one piece black foam to 100 mm cont suction. Pt was medicated for pain prior to the procedure and tolerated with minimal amt discomfort,  small amt tan drainage in the cannister. There is a moist red rash surrounding the wound edges in the skin folds; appearance is consistent with moisture associated skin damage and possible candidiasis. Supplies at the bedside for next dressing change.  .   WOC team will plan to change again on Monday if patient is still in the hospital at that time.  Thank-you,  Stephane Fought MSN, RN, CWOCN, CWCN-AP, CNS Contact Mon-Fri 0700-1500: [phone]

## 2024-01-22 NOTE — Plan of Care (Signed)

## 2024-01-22 NOTE — Plan of Care (Signed)
  Problem: Pain Managment: Goal: General experience of comfort will improve and/or be controlled Outcome: Progressing   Problem: Elimination: Goal: Will not experience complications related to urinary retention Outcome: Progressing   Problem: Coping: Goal: Level of anxiety will decrease Outcome: Progressing   Problem: Coping: Goal: Ability to adjust to condition or change in health will improve Outcome: Progressing

## 2024-01-22 NOTE — Evaluation (Signed)
 Occupational Therapy Evaluation Patient Details Name: Manuel [name] MRN: [id number] DOB: 10-02-1945 Today's Date: 01/22/2024   History of Present Illness   Pt is an 78 y/o M admitted on 01/16/24 after presenting from wound clinic for evaluation of delayed wound healing & wound vac difficulty maintaining a seal. PMH: DM, HTN, OSA, HFrEF, CAD s/p multiple stents, extensive PAD/carotid artery stenosis with hx of revascularization     Clinical Impressions Patient presenting with decreased Ind in self care,balance, functional mobility/transfers, endurance, and safety awareness. Patient reports being Mod I at baseline and living at home with wife. She is available to assist him as needed but works for several hours outside of the home daily. Pt appears to be in much better spirits this admission than when therapist has previously met patient. Pt had already donned R LE prosthesis prior to therapist arrival. Pt stands from recliner chair with min guard and use of RW. Pt also demonstrates standing and reaching for BSC to pivot with min A for transfer. Pt does endorse difficulty with hygiene on commode. OT demonstrates how to perform lateral leans while seated on commode chair for hygiene and clothing management needs to decrease fall risk during task. Pt verbalized understanding and to attempt next session. Pt's lunch tray has arrived and would like to eat. Call bell and all needed items within reach.  Patient will benefit from acute OT to increase overall independence in the areas of ADLs, functional mobility, and safety awareness  in order to safely discharge.     If plan is discharge home, recommend the following:   A little help with walking and/or transfers;A little help with bathing/dressing/bathroom;Assistance with cooking/housework;Help with stairs or ramp for entrance;Assist for transportation     Functional Status Assessment   Patient has had a recent decline in their functional status  and demonstrates the ability to make significant improvements in function in a reasonable and predictable amount of time.     Equipment Recommendations   None recommended by OT      Precautions/Restrictions   Precautions Precautions: Fall Recall of Precautions/Restrictions: Intact Precaution/Restrictions Comments: RLE prosthesis, R groin wound vac     Mobility Bed Mobility               General bed mobility comments: seated in recliner chair at beginning and end of session    Transfers Overall transfer level: Needs assistance Equipment used: Rolling walker (2 wheels) Transfers: Sit to/from Stand Sit to Stand: Contact guard assist                  Balance Overall balance assessment: Needs assistance Sitting-balance support: Feet supported, No upper extremity supported Sitting balance-Leahy Scale: Good     Standing balance support: Bilateral upper extremity supported, Reliant on assistive device for balance, During functional activity Standing balance-Leahy Scale: Fair                             ADL either performed or assessed with clinical judgement   ADL Overall ADL's : Needs assistance/impaired                         Toilet Transfer: Stand-pivot;BSC/3in1;Minimal assistance                   Vision Patient Visual Report: No change from baseline              Pertinent Vitals/Pain Pain  Assessment Pain Assessment: No/denies pain     Extremity/Trunk Assessment Upper Extremity Assessment Upper Extremity Assessment: Overall WFL for tasks assessed;Generalized weakness   Lower Extremity Assessment Lower Extremity Assessment: Generalized weakness       Communication Communication Communication: Impaired Factors Affecting Communication: Hearing impaired   Cognition Arousal: Alert Behavior During Therapy: WFL for tasks assessed/performed Cognition: No apparent impairments                                Following commands: Intact       Cueing  General Comments   Cueing Techniques: Verbal cues              Home Living Family/patient expects to be discharged to:: Private residence Living Arrangements: Spouse/significant other Available Help at Discharge: Family;Available PRN/intermittently;Personal care attendant Type of Home: House Home Access: Ramped entrance     Home Layout: Able to live on main level with bedroom/bathroom;Two level;Laundry or work area in Artist of Steps: 15 Alternate Level Stairs-Rails: Right;Left Bathroom Shower/Tub: Chief Strategy Officer: Handicapped height Bathroom Accessibility: Yes How Accessible: Accessible via wheelchair;Accessible via walker Home Equipment: Agricultural consultant (2 wheels);BSC/3in1;Wheelchair - manual;Cane - single point;Tub bench;Crutches   Additional Comments: RLE prosthesis, Reports he had a PCA prior to admission.  Lives With: Spouse    Prior Functioning/Environment Prior Level of Function : Independent/Modified Independent             Mobility Comments: Pt reports he was ambulatory with RW or crutches with RLE prosthesis. ADLs Comments: Pt reports that he is indepenent with ADLs  but does have a PCA.    OT Problem List: Decreased strength;Impaired balance (sitting and/or standing);Decreased safety awareness;Decreased activity tolerance   OT Treatment/Interventions: Self-care/ADL training;Therapeutic exercise;Energy conservation;Therapeutic activities      OT Goals(Current goals can be found in the care plan section)   Acute Rehab OT Goals Patient Stated Goal: to increase Independence and return home OT Goal Formulation: With patient Time For Goal Achievement: 02/05/24 Potential to Achieve Goals: Fair   OT Frequency:  Min 2X/week       AM-PAC OT 6 Clicks Daily Activity     Outcome Measure Help from another person eating meals?: None Help from another  person taking care of personal grooming?: None Help from another person toileting, which includes using toliet, bedpan, or urinal?: A Lot Help from another person bathing (including washing, rinsing, drying)?: A Lot Help from another person to put on and taking off regular upper body clothing?: A Little Help from another person to put on and taking off regular lower body clothing?: A Lot 6 Click Score: 17   End of Session Nurse Communication: Mobility status  Activity Tolerance: Patient tolerated treatment well Patient left: in chair;with call bell/phone within reach;with chair alarm set  OT Visit Diagnosis: Unsteadiness on feet (R26.81);Repeated falls (R29.6)                Time: 8843-8779 OT Time Calculation (min): 24 min Charges:  OT General Charges $OT Visit: 1 Visit OT Treatments $Self Care/Home Management : 23-37 mins Izetta Claude, MS, OTR/L , CBIS ascom [phone]  01/22/24, 4:11 PM

## 2024-01-22 NOTE — Progress Notes (Signed)
 Date of Admission:  01/16/2024     ID: Manuel [name] is a 78 y.o. male  Principal Problem:   Delayed wound healing Active Problems:   Coronary artery disease with history of myocardial infarction without history of CABG   Chronic systolic CHF (congestive heart failure) (HCC)   Uncontrolled type 2 diabetes mellitus with hyperglycemia, with long-term current use of insulin  (HCC)   Wound dehiscence, right groin   Long term (current) use of antibiotics  Pt seen with wound care consultant  Subjective: Patient is doing well No pain    Medications:   aspirin  EC  81 mg Oral Daily   atorvastatin   80 mg Oral QHS   carvedilol   3.125 mg Oral BID WC   Chlorhexidine  Gluconate Cloth  6 each Topical Daily   clopidogrel   75 mg Oral Daily   dapagliflozin  propanediol  10 mg Oral Daily   enoxaparin  (LOVENOX ) injection  0.5 mg/kg Subcutaneous Q24H   escitalopram   10 mg Oral Daily   ferrous sulfate   325 mg Oral Q breakfast   fluconazole  200 mg Oral Daily   folic acid   1 mg Oral Daily   gabapentin   100 mg Oral BID   insulin  aspart  0-15 Units Subcutaneous TID WC   insulin  aspart  0-5 Units Subcutaneous QHS   pantoprazole   40 mg Oral Daily   polyethylene glycol  17 g Oral Daily   sacubitril -valsartan   1 tablet Oral Q12H   sodium chloride  flush  10-40 mL Intracatheter Q12H   spironolactone   25 mg Oral Daily   thiamine   100 mg Oral Daily   traZODone   100 mg Oral QHS    Objective: Vital signs in last 24 hours: Patient Vitals for the past 24 hrs:  BP Temp Temp src Pulse Resp SpO2  01/22/24 0738 (!) 158/83 98.4 F (36.9 C) Oral 72 15 95 %  01/22/24 0406 127/65 97.8 F (36.6 C) Oral 71 17 94 %  01/21/24 2038 (!) 148/79 99 F (37.2 C) Oral 73 18 98 %  01/21/24 1646 (!) 155/95 98.1 F (36.7 C) Oral 77 19 100 %     PHYSICAL EXAM:  General: Alert, cooperative, no distress, appears stated age.  Lungs: Clear to auscultation bilaterally. No Wheezing or Rhonchi. No rales. Heart: Regular  rate and rhythm, no murmur, rub or gallop. Abdomen: Soft, non-tender,not distended. Bowel sounds normal. No masses Extremities: Right wound vac removed Surgical wound is much smaller and healing from iside up Fibrinous tissue Surrounding skin yeast  Skin: as above Lymph: Cervical, supraclavicular normal. Neurologic: Grossly non-focal  Lab Results    Latest Ref Rng & Units 01/22/2024    2:30 AM 01/21/2024    3:33 AM 01/20/2024    4:27 AM  CBC  WBC 4.0 - 10.5 K/uL 7.0  7.6  7.9   Hemoglobin 13.0 - 17.0 g/dL 87.1  88.8  88.0   Hematocrit 39.0 - 52.0 % 38.9  33.1  35.6   Platelets 150 - 400 K/uL 264  228  256        Latest Ref Rng & Units 01/22/2024    2:30 AM 01/21/2024    3:33 AM 01/20/2024    4:27 AM  CMP  Glucose 70 - 99 mg/dL 892  852  867   BUN 8 - 23 mg/dL 29  26  23    Creatinine 0.61 - 1.24 mg/dL 9.33  9.32  9.23   Sodium 135 - 145 mmol/L 136  136  136  Potassium 3.5 - 5.1 mmol/L 4.1  4.2  4.0   Chloride 98 - 111 mmol/L 101  103  103   CO2 22 - 32 mmol/L 27  25  25    Calcium  8.9 - 10.3 mg/dL 9.4  8.9  9.1       Microbiology: None    Assessment/Plan: Recent Serratia bacteremia.  Source was the right groin.  He had infection bovine graft Status Post removal of the endovascular graft on 12/11/2023.  The culture was also Serratia.  Repeat blood culture has been negative Patient has been on ertapenem  to complete 6 weeks of IV antibiotics on 01/22/24 from the day of graft removal which was 12/11/2023  On evaluating the wound it looks  healthy and filling from inside up We can complete IV ertapenem  and remove PICC beofre his discarge After that  bactrim  DS 1 BID for 1 week I did take culture today but doubt he has any infection   Candida infection surroundign skin- will do fluconazole 200mg  every day  for 7 days  Right common femoral artery graft with rupture and leaking into the cutaneous tissue forming a hematoma in August 2025 Was taken for surgery on 12/11/2023 and  underwent resection of the infected common femoral artery, removal of this femoral stents and removal of the infected profunda femoris artery stents.  He then had a right external iliac to profunda femoris artery bypass with autoLog us  superficial femoral artery conduit.  And had a wound VAC placed on 12/11/2023  Culture of the tissue was Serratia. Patient is currently on ertapenem  and will complete 6 weeks on 01/22/2024.  A repeat culture was taken from the wound on 12/28/2023 by the vascular surgeon and it grew Candida glabrata.  This resulted after he was discharged from the hospital.  This has not been treated. The wound looks very healthy except for a couple of areas of yellow stuff If this is purulence then we may culture it and and treat accordingly  Patient is in the hospital because of concern of wound VAC not being connected to the machine every day at [facility]  Anemia has improved since the last admission.  Next  AKI is resolved  Diabetes mellitus  Discussed the management with the patient hospitalist and pharmacist

## 2024-01-22 NOTE — Progress Notes (Signed)
 Progress Note    01/22/2024 12:01 PM * No surgery found *  Subjective:   Manuel [name] is an 78 yo male who presented to the emergency department after being seen in vein and vascular clinic last Friday on 01/16/2024.  Patient was having difficulties with the facility he was staying in being able to handle his need for a wound VAC.  Patient currently has a wound VAC to his right groin for postoperative infection to control hemorrhage to his right femoral.  Patient also continues on IV antibiotics as he has been getting them for 6 weeks outpatient.   Patient wife at the bedside this morning reports that the wound care nurse was in and changed the wound VAC yesterday morning (01/20/24).  Wound care nurse recommendations are for the patient to continue the wound VAC at this time.  Patient was loaded into media files of the wound prior to applying a new wound VAC today.  No signs or symptoms of infection.  Beefy red healthy tissue is growing in the wound bed.  No other complaints overnight.  Wound VAC is sealed well vitals all remained stable.   Vitals:   01/22/24 0406 01/22/24 0738  BP: 127/65 (!) 158/83  Pulse: 71 72  Resp: 17 15  Temp: 97.8 F (36.6 C) 98.4 F (36.9 C)  SpO2: 94% 95%   Physical Exam: Cardiac:  RRR, normal S1 and S2.  No murmurs appreciated. Lungs: Nonlabored breathing, clear on auscultation throughout, no rales rhonchi or wheezing. Incisions: Right groin postoperative surgical incision with wound VAC placement.  Picture of wound media files today.  No signs or symptoms of infection hematoma or seroma. Extremities: Patient with a history of right BKA.  Left lower extremity warm to touch with palpable pulses. Abdomen: Positive bowel sounds throughout, soft, nontender nondistended. Neurologic: Alert and oriented x 3, answers questions follows commands appropriately.  CBC    Component Value Date/Time   WBC 7.0 01/22/2024 0230   RBC 4.51 01/22/2024 0230   HGB 12.8 (L)  01/22/2024 0230   HGB 15.7 02/16/2012 2013   HCT 38.9 (L) 01/22/2024 0230   HCT 44.4 02/16/2012 2013   PLT 264 01/22/2024 0230   PLT 176 02/16/2012 2013   MCV 86.3 01/22/2024 0230   MCV 91 02/16/2012 2013   MCH 28.4 01/22/2024 0230   MCHC 32.9 01/22/2024 0230   RDW 14.2 01/22/2024 0230   RDW 13.0 02/16/2012 2013   LYMPHSABS 0.5 (L) 12/04/2023 1141   MONOABS 0.5 12/04/2023 1141   EOSABS 0.1 12/04/2023 1141   BASOSABS 0.0 12/04/2023 1141    BMET    Component Value Date/Time   NA 136 01/22/2024 0230   NA 137 07/21/2023 1246   NA 137 02/16/2012 2013   K 4.1 01/22/2024 0230   K 3.8 02/16/2012 2013   CL 101 01/22/2024 0230   CL 102 02/16/2012 2013   CO2 27 01/22/2024 0230   CO2 25 02/16/2012 2013   GLUCOSE 107 (H) 01/22/2024 0230   GLUCOSE 154 (H) 02/16/2012 2013   BUN 29 (H) 01/22/2024 0230   BUN 30 (H) 07/21/2023 1246   BUN 10 02/16/2012 2013   CREATININE 0.66 01/22/2024 0230   CREATININE 0.72 02/16/2012 2013   CALCIUM  9.4 01/22/2024 0230   CALCIUM  9.6 02/16/2012 2013   GFRNONAA >60 01/22/2024 0230   GFRNONAA >60 02/16/2012 2013   GFRAA >60 12/09/2018 0929   GFRAA >60 02/16/2012 2013    INR    Component Value Date/Time  INR 1.3 (H) 12/12/2023 0816     Intake/Output Summary (Last 24 hours) at 01/22/2024 1201 Last data filed at 01/22/2024 0900 Gross per 24 hour  Intake 1200 ml  Output 450 ml  Net 750 ml     Assessment/Plan:  78 y.o. male is s/p right femoral endarterectomy hemorrhage with surgical repair and wound VAC placement.  * No surgery found *   PLAN Patient to be discharged home later today with home health coming to his house twice a week either on Mondays Thursdays or Tuesdays and Fridays to change his wound VAC twice a week.  Patient's IV antibiotics have been changed to oral so there is no need for IV antibiotics at home.  Both the patient and her wife are okay with him going home and continuing at home with physical therapy as well.  Patient will  be discharged on aspirin  81 daily Plavix  75 daily Lipitor  40 mg daily.  Patient will follow-up with vein and vascular services in 3 to 4 weeks in the office for wound check.  Patient and wife were advised to bring a set of wound VAC supplies so that wound VAC could be taken down and the wound to be evaluated on his postop visit.  Patient will be called to schedule this visit appropriately.  DVT prophylaxis: Aspirin  81 mg daily, Plavix  75 mg daily and Lipitor  40   Man[name] 01/22/2024 12:01 PM

## 2024-01-22 NOTE — Discharge Instructions (Signed)
 Vascular surgery discharge instructions  Patient is being discharged home on a wound VAC to his right groin.  These changes are to be done by home health twice a week either on Monday and Thursday or Tuesday and Friday.  Any difficulties with the operation of the wound VAC patient needs to call the home health company immediately.  Do not call the vein and vascular office as we have no connection to the home health company to come out and fix the wound VAC issues.  Please provide the patient and his wife with the home health phone number for contact.  Patient will be scheduled for follow-up in the office in 3 to 4 weeks.  Please instruct the patient to bring a set of wound VAC supplies to his visit so it can be taken down and the wound can be about.  If the patient does not provide these supplies during that visit the visit will be canceled and rescheduled until he can bring a set of supplies so we can change the wound VAC while at his visit.  Patient needs to follow-up with vein and vascular services as scheduled.

## 2024-01-23 DIAGNOSIS — T827XXD Infection and inflammatory reaction due to other cardiac and vascular devices, implants and grafts, subsequent encounter: Secondary | ICD-10-CM | POA: Diagnosis not present

## 2024-01-23 DIAGNOSIS — T148XXD Other injury of unspecified body region, subsequent encounter: Secondary | ICD-10-CM | POA: Diagnosis not present

## 2024-01-23 DIAGNOSIS — R7881 Bacteremia: Secondary | ICD-10-CM | POA: Diagnosis not present

## 2024-01-23 DIAGNOSIS — D649 Anemia, unspecified: Secondary | ICD-10-CM | POA: Diagnosis not present

## 2024-01-23 DIAGNOSIS — L089 Local infection of the skin and subcutaneous tissue, unspecified: Secondary | ICD-10-CM

## 2024-01-23 DIAGNOSIS — B9689 Other specified bacterial agents as the cause of diseases classified elsewhere: Secondary | ICD-10-CM | POA: Diagnosis not present

## 2024-01-23 LAB — GLUCOSE, CAPILLARY
Glucose-Capillary: 147 mg/dL — ABNORMAL HIGH (ref 70–99)
Glucose-Capillary: 203 mg/dL — ABNORMAL HIGH (ref 70–99)
Glucose-Capillary: 135 mg/dL — ABNORMAL HIGH (ref 70–99)
Glucose-Capillary: 149 mg/dL — ABNORMAL HIGH (ref 70–99)

## 2024-01-23 NOTE — Progress Notes (Signed)
 Progress Note    01/23/2024 3:12 PM * No surgery found *  Subjective:  Manuel [name] is an 78 yo male who presented to the emergency department after being seen in vein and vascular clinic last Friday on 01/16/2024.  Patient was having difficulties with the facility he was staying in being able to handle his need for a wound VAC.  Patient currently has a wound VAC to his right groin for postoperative infection to control hemorrhage to his right femoral.  Patient also continues on IV antibiotics as he has been getting them for 6 weeks outpatient.   Patient wife at the bedside this morning reports that the wound care nurse was in and changed the wound VAC yesterday morning (01/22/24).  Wound care nurse recommendations are for the patient to continue the wound VAC at this time.  Patient was loaded into media files of the wound prior to applying a new wound VAC today.  No signs or symptoms of infection.  Beefy red healthy tissue is growing in the wound bed.  No other complaints overnight.  Wound VAC is sealed well vitals all remained stable.   Vitals:   01/23/24 0407 01/23/24 0751  BP: (!) 129/57 124/74  Pulse: 69 68  Resp: 18 17  Temp: 98.6 F (37 C) (!) 97.5 F (36.4 C)  SpO2: 97% 96%   Physical Exam: Cardiac:  RRR, normal S1 and S2.  No murmurs appreciated. Lungs: Nonlabored breathing, clear on auscultation throughout, no rales rhonchi or wheezing. Incisions: Right groin postoperative surgical incision with wound VAC placement.  Picture of wound media files today.  No signs or symptoms of infection hematoma or seroma. Extremities: Patient with a history of right BKA.  Left lower extremity warm to touch with palpable pulses. Abdomen: Positive bowel sounds throughout, soft, nontender nondistended. Neurologic: Alert and oriented x 3, answers questions follows commands appropriately.  CBC    Component Value Date/Time   WBC 7.0 01/22/2024 0230   RBC 4.51 01/22/2024 0230   HGB 12.8 (L)  01/22/2024 0230   HGB 15.7 02/16/2012 2013   HCT 38.9 (L) 01/22/2024 0230   HCT 44.4 02/16/2012 2013   PLT 264 01/22/2024 0230   PLT 176 02/16/2012 2013   MCV 86.3 01/22/2024 0230   MCV 91 02/16/2012 2013   MCH 28.4 01/22/2024 0230   MCHC 32.9 01/22/2024 0230   RDW 14.2 01/22/2024 0230   RDW 13.0 02/16/2012 2013   LYMPHSABS 0.5 (L) 12/04/2023 1141   MONOABS 0.5 12/04/2023 1141   EOSABS 0.1 12/04/2023 1141   BASOSABS 0.0 12/04/2023 1141    BMET    Component Value Date/Time   NA 136 01/22/2024 0230   NA 137 07/21/2023 1246   NA 137 02/16/2012 2013   K 4.1 01/22/2024 0230   K 3.8 02/16/2012 2013   CL 101 01/22/2024 0230   CL 102 02/16/2012 2013   CO2 27 01/22/2024 0230   CO2 25 02/16/2012 2013   GLUCOSE 107 (H) 01/22/2024 0230   GLUCOSE 154 (H) 02/16/2012 2013   BUN 29 (H) 01/22/2024 0230   BUN 30 (H) 07/21/2023 1246   BUN 10 02/16/2012 2013   CREATININE 0.66 01/22/2024 0230   CREATININE 0.72 02/16/2012 2013   CALCIUM  9.4 01/22/2024 0230   CALCIUM  9.6 02/16/2012 2013   GFRNONAA >60 01/22/2024 0230   GFRNONAA >60 02/16/2012 2013   GFRAA >60 12/09/2018 0929   GFRAA >60 02/16/2012 2013    INR    Component Value Date/Time  INR 1.3 (H) 12/12/2023 0816     Intake/Output Summary (Last 24 hours) at 01/23/2024 1512 Last data filed at 01/23/2024 1300 Gross per 24 hour  Intake 720 ml  Output 690 ml  Net 30 ml     Assessment/Plan:  78 y.o. male is s/p right femoral endarterectomy hemorrhage with surgical repair and wound VAC placement.  * No surgery found *   PLAN Patient to be discharged home later today with home health coming to his house twice a week either on Mondays Thursdays or Tuesdays and Fridays to change his wound VAC twice a week.  Patient's IV antibiotics have been changed to oral so there is no need for IV antibiotics at home.  Both the patient and her wife are okay with him going home and continuing at home with physical therapy as well.  Patient will be  discharged on aspirin  81 daily Plavix  75 daily Lipitor  40 mg daily.  Patient will follow-up with vein and vascular services in 3 to 4 weeks in the office for wound check.   Patient and wife were advised to bring a set of wound VAC supplies so that wound VAC could be taken down and the wound to be evaluated on his postop visit.  Patient will be called to schedule this visit appropriately.   DVT prophylaxis: Aspirin  81 mg daily, Plavix  75 mg daily and Lipitor  40   Kashmere Staffa R Vara Mairena [facility] 01/23/2024 3:12 PM

## 2024-01-23 NOTE — Progress Notes (Signed)
 Date of Admission:  01/16/2024     ID: Manuel [name] is a 78 y.o. 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 a walker and prosthetic rt leg    Medications:   aspirin  EC  81 mg Oral Daily   atorvastatin   80 mg Oral QHS   carvedilol   3.125 mg Oral BID WC   Chlorhexidine  Gluconate Cloth  6 each Topical Daily   clopidogrel   75 mg Oral Daily   dapagliflozin  propanediol  10 mg Oral Daily   enoxaparin  (LOVENOX ) injection  0.5 mg/kg Subcutaneous Q24H   escitalopram   10 mg Oral Daily   ferrous sulfate   325 mg Oral Q breakfast   fluconazole  200 mg Oral Daily   folic acid   1 mg Oral Daily   gabapentin   100 mg Oral BID   insulin  aspart  0-15 Units Subcutaneous TID WC   insulin  aspart  0-5 Units Subcutaneous QHS   pantoprazole   40 mg Oral Daily   polyethylene glycol  17 g Oral Daily   sacubitril -valsartan   1 tablet Oral Q12H   sodium chloride  flush  10-40 mL Intracatheter Q12H   spironolactone   25 mg Oral Daily   thiamine   100 mg Oral Daily   traZODone   100 mg Oral QHS    Objective: Vital signs in last 24 hours: Patient Vitals for the past 24 hrs:  BP Temp Pulse Resp SpO2  01/23/24 1558 122/70 97.7 F (36.5 C) 64 17 98 %  01/23/24 0751 124/74 (!) 97.5 F (36.4 C) 68 17 96 %  01/23/24 0407 (!) 129/57 98.6 F (37 C) 69 18 97 %  01/22/24 1940 115/70 98.1 F (36.7 C) 66 -- 98 %     PHYSICAL EXAM:  General: Alert, cooperative, no distress, appears stated age.  Lungs: Clear to auscultation bilaterally. No Wheezing or Rhonchi. No rales. Heart: Regular rate and rhythm, no  murmur, rub or gallop. Abdomen: Soft, non-tender,not distended. Bowel sounds normal. No masses Extremities: Right wound vac removed Surgical wound is much smaller and healing from iside up Fibrinous tissue Surrounding skin yeast  Skin: as above Lymph: Cervical, supraclavicular normal. Neurologic: Grossly non-focal  Lab Results    Latest Ref Rng & Units 01/22/2024    2:30 AM 01/21/2024    3:33 AM 01/20/2024    4:27 AM  CBC  WBC 4.0 - 10.5 K/uL 7.0  7.6  7.9   Hemoglobin 13.0 - 17.0 g/dL 87.1  88.8  88.0   Hematocrit 39.0 - 52.0 % 38.9  33.1  35.6   Platelets 150 - 400 K/uL 264  228  256        Latest Ref Rng & Units 01/22/2024    2:30 AM 01/21/2024    3:33 AM 01/20/2024    4:27 AM  CMP  Glucose 70 - 99 mg/dL 892  852  867   BUN 8 - 23 mg/dL 29  26  23    Creatinine 0.61 - 1.24 mg/dL 9.33  9.32  9.23   Sodium 135 - 145 mmol/L 136  136  136  Potassium 3.5 - 5.1 mmol/L 4.1  4.2  4.0   Chloride 98 - 111 mmol/L 101  103  103   CO2 22 - 32 mmol/L 27  25  25    Calcium  8.9 - 10.3 mg/dL 9.4  8.9  9.1       Microbiology: None    Assessment/Plan: Recent Serratia bacteremia.  Source was the right groin.  He had infection bovine graft Status Post removal of the endovascular graft on 12/11/2023.  The culture was also Serratia.  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  He then had a right external iliac to profunda femoris artery bypass with autoLog us  superficial femoral artery conduit.  And had a wound VAC placed on 12/11/2023  Culture of the tissue was Serratia. Patient is currently on ertapenem  and will complete 6 weeks on 01/22/2024.  A repeat culture was taken from the wound on 12/28/2023 by the vascular surgeon and it grew Candida glabrata.  This resulted after he was discharged from the hospital.  This has not been treated. The wound looks very healthy except for a couple of areas of yellow stuff If this is purulence then we may culture it and and treat accordingly  Patient is in the hospital because of concern of wound VAC not being connected to the machine every day at [facility]  Anemia has improved since the last admission.  Next  AKI is resolved  Diabetes mellitus  Discussed the management with the patient and hospitalist  Will follow him as OP 02/03/24 at 10.45 am  ID will sign off- call if needed

## 2024-01-23 NOTE — Progress Notes (Signed)
 PROGRESS NOTE    Manuel [name]  FMW:[id number] DOB: 03-Jan-1946 DOA: 01/16/2024 PCP: [name], [name], MD  Chief Complaint  Patient presents with   Wound Check    Wound vac unable to get a seal and is not working properly     Hospital Course:  Manuel [name] is an 78 year old male with DM, hypertension, OSA, heart failure reduced EF 25 to 30%, CAD status post multiple stents, extensive PAD, carotid artery stenosis with history of revascularization, had recent stent placement to the right femoral artery complicated by femoral artery rupture/repair/wound dehiscence/Serratia infection.  Patient was recently discharged to skilled nursing facility with a wound VAC and ertapenem  via PICC.  He was sent in from the vascular clinic for wound VAC change due to delayed wound healing and difficulty maintaining wound Vac seal at his SNF.  In the ED labs and also are mostly unremarkable.  Vascular surgery was consulted on arrival.  Subjective: No acute events overnight.  Patient remains anxious to discharge home today.  We are awaiting wound VAC arrangements.  Objective: Vitals:   01/22/24 1615 01/22/24 1940 01/23/24 0407 01/23/24 0751  BP: 128/68 115/70 (!) 129/57 124/74  Pulse: 67 66 69 68  Resp: 15  18 17   Temp: 98.4 F (36.9 C) 98.1 F (36.7 C) 98.6 F (37 C) (!) 97.5 F (36.4 C)  TempSrc: Oral     SpO2: 99% 98% 97% 96%  Weight:      Height:        Intake/Output Summary (Last 24 hours) at 01/23/2024 1552 Last data filed at 01/23/2024 1300 Gross per 24 hour  Intake 720 ml  Output 690 ml  Net 30 ml   Filed Weights   01/16/24 1546  Weight: 90.7 kg    Examination: General exam: Appears calm and comfortable, NAD  Respiratory system: No work of breathing, symmetric chest wall expansion Cardiovascular system: S1 & S2 heard, RRR.  Gastrointestinal system: Abdomen is nondistended, soft and nontender.  Neuro: Alert and oriented. No focal neurological deficits. Extremities: s/p r bka,  wound vac over right groin, no oozing  Assessment & Plan:  Principal Problem:   Delayed wound healing Active Problems:   Coronary artery disease with history of myocardial infarction without history of CABG   Chronic systolic CHF (congestive heart failure) (HCC)   Uncontrolled type 2 diabetes mellitus with hyperglycemia, with long-term current use of insulin  (HCC)   Wound dehiscence, right groin   Long term (current) use of antibiotics    Delayed wound healing Serratia wound infection Wound dehiscence right groin status post repair of posterior vascular stent Femoral artery rupture Long-term antibiotics via PICC - Vascular surgery consulted, recommends wound VAC changes on Monday and Thursdays - Currently on ertapenem  and Diflucan.  Will plan to discharge with 7 days of Bactrim  - Infectious disease was consulted, recommendations as above - Repeat wound cultures taken at wound VAC change 10/2 - TOC consulted to assist in home health wound VAC arrangements, currently pending.  All forms have been signed by vascular surgery  - Planning for home health at discharge. - As needed pain meds - PT/OT Home health ordered at DC  Chronic systolic CHF, EF 7974% History of NSVT - Currently euvolemic - Continue home GDMT, titrate as needed  Uncontrolled type 2 diabetes with hyperglycemia with long-term use of insulin  - Blood glucose elevated on arrival - Continue basal/bolus and sliding scale.  Titrate as needed  History of alcohol  abuse - Continue thiamine   Sleep  apnea with nocturnal hypoxia - As needed oxygen at night  BMI 30 Obesity class - Outpatient follow up for lifestyle modification and risk factor management  CAD - Continue home meds   DVT prophylaxis: Lovenox    Code Status: Limited: Do not attempt resuscitation (DNR) -DNR-LIMITED -Do Not Intubate/DNI  Disposition: Planning for home health at discharge.  TOC consulted to help with arrangements.  Pending wound VAC  delivery and set up  Consultants:    Procedures:    Antimicrobials:  Anti-infectives (From admission, onward)    Start     Dose/Rate Route Frequency Ordered Stop   01/22/24 1000  fluconazole (DIFLUCAN) tablet 200 mg        200 mg Oral Daily 01/22/24 0836     01/17/24 1100  ertapenem  (INVANZ ) 1 g in sodium chloride  0.9 % 100 mL IVPB        1 g 200 mL/hr over 30 Minutes Intravenous Every 24 hours 01/17/24 0255     01/17/24 0115  ertapenem  (INVANZ ) IVPB  Status:  Discontinued        1 g Intravenous Every 24 hours 01/17/24 0105 01/17/24 0254       Data Reviewed: I have personally reviewed following labs and imaging studies CBC: Recent Labs  Lab 01/18/24 0550 01/19/24 0321 01/20/24 0427 01/21/24 0333 01/22/24 0230  WBC 6.3 6.9 7.9 7.6 7.0  HGB 11.3* 11.4* 11.9* 11.1* 12.8*  HCT 34.0* 34.1* 35.6* 33.1* 38.9*  MCV 85.4 85.0 84.8 85.8 86.3  PLT 253 253 256 228 264   Basic Metabolic Panel: Recent Labs  Lab 01/18/24 0550 01/19/24 0321 01/20/24 0427 01/21/24 0333 01/22/24 0230  NA 137 137 136 136 136  K 3.8 4.1 4.0 4.2 4.1  CL 103 105 103 103 101  CO2 25 26 25 25 27   GLUCOSE 174* 141* 132* 147* 107*  BUN 24* 21 23 26* 29*  CREATININE 0.68 0.87 0.76 0.67 0.66  CALCIUM  8.9 9.0 9.1 8.9 9.4  MG 2.1 1.9 2.0  --   --   PHOS 3.5 3.3 3.5  --   --    GFR: Estimated Creatinine Clearance: 80.5 mL/min (by C-G formula based on SCr of 0.66 mg/dL). Liver Function Tests: No results for input(s): AST, ALT, ALKPHOS, BILITOT, PROT, ALBUMIN in the last 168 hours. CBG: Recent Labs  Lab 01/22/24 0744 01/22/24 1144 01/22/24 1728 01/23/24 0751 01/23/24 1138  GLUCAP 128* 168* 160* 147* 203*    Recent Results (from the past 240 hours)  MRSA Next Gen by PCR, Nasal     Status: None   Collection Time: 01/19/24  9:29 PM   Specimen: Nasal Mucosa; Nasal Swab  Result Value Ref Range Status   MRSA by PCR Next Gen NOT DETECTED NOT DETECTED Final    Comment: (NOTE) The  GeneXpert MRSA Assay (FDA approved for NASAL specimens only), is one component of a comprehensive MRSA colonization surveillance program. It is not intended to diagnose MRSA infection nor to guide or monitor treatment for MRSA infections. Test performance is not FDA approved in patients less than 75 years old. Performed at [facility], [address] [place], [place] [address]   Aerobic Culture w Gram Stain (superficial specimen)     Status: None (Preliminary result)   Collection Time: 01/22/24  9:00 AM   Specimen: Wound  Result Value Ref Range Status   Specimen Description   Final    WOUND Performed at [facility], [address] [place], [place] [address]  Special Requests   Final    NONE Performed at [facility], [address] [place], [place] [address]    Gram Stain PENDING  Incomplete   Culture   Final    RARE ENTEROCOCCUS FAECIUM RARE STAPHYLOCOCCUS EPIDERMIDIS CULTURE REINCUBATED FOR BETTER GROWTH Performed at [facility], [address] [place], [place] [address]    Report Status PENDING  Incomplete     Radiology Studies: No results found.  Scheduled Meds:  aspirin  EC  81 mg Oral Daily   atorvastatin   80 mg Oral QHS   carvedilol   3.125 mg Oral BID WC   Chlorhexidine  Gluconate Cloth  6 each Topical Daily   clopidogrel   75 mg Oral Daily   dapagliflozin  propanediol  10 mg Oral Daily   enoxaparin  (LOVENOX ) injection  0.5 mg/kg Subcutaneous Q24H   escitalopram   10 mg Oral Daily   ferrous sulfate   325 mg Oral Q breakfast   fluconazole  200 mg Oral Daily   folic acid   1 mg Oral Daily   gabapentin   100 mg Oral BID   insulin  aspart  0-15 Units Subcutaneous TID WC   insulin  aspart  0-5 Units Subcutaneous QHS   pantoprazole   40 mg Oral Daily   polyethylene glycol  17 g Oral Daily   sacubitril -valsartan   1 tablet Oral Q12H   sodium chloride  flush  10-40 mL Intracatheter Q12H   spironolactone   25 mg Oral Daily   thiamine    100 mg Oral Daily   traZODone   100 mg Oral QHS   Continuous Infusions:  ertapenem  1 g (01/23/24 1108)     LOS: 0 days  MDM: Patient is high risk for one or more organ failure.  They necessitate ongoing hospitalization for continued IV therapies and subsequent lab monitoring. Total time spent interpreting labs and vitals, reviewing the medical record, coordinating care amongst consultants and care team members, directly assessing and discussing care with the patient and/or family: 55 min  Tacora Athanas, DO [facility]  To contact the attending physician between 7A-7P please use Epic Chat. To contact the covering physician during after hours 7P-7A, please review Amion.  01/23/2024, 3:52 PM   *This document has been created with the assistance of dictation software. Please excuse typographical errors. *

## 2024-01-23 NOTE — Progress Notes (Signed)
 Physical Therapy Treatment Patient Details Name: Manuel [name] MRN: [id number] DOB: [birth year] Today's Date: 01/23/2024   History of Present Illness Pt is an 78 y/o M admitted on 01/16/24 after presenting from wound clinic for evaluation of delayed wound healing & wound vac difficulty maintaining a seal. PMH: DM, HTN, OSA, HFrEF, CAD s/p multiple stents, extensive PAD/carotid artery stenosis with hx of revascularization    PT Comments  Pt seen for PT tx with pt agreeable. Pt is able to transfer sit<>stand with good awareness re: hand placement & eccentric lowering. Pt ambulates into hallway with RW & CGA without LOB, cuing re: positioning within base of AD. Adjusted height of RW for improved upright posture. Recommend ongoing PT services to progress mobility as able.    If plan is discharge home, recommend the following: A little help with walking and/or transfers;A little help with bathing/dressing/bathroom;Assistance with cooking/housework;Assist for transportation;Help with stairs or ramp for entrance   Can travel by private vehicle        Equipment Recommendations  None recommended by PT    Recommendations for Other Services       Precautions / Restrictions Precautions Precautions: Fall Recall of Precautions/Restrictions: Intact Precaution/Restrictions Comments: RLE prosthesis, R groin wound vac Restrictions Weight Bearing Restrictions Per Provider Order: No     Mobility  Bed Mobility               General bed mobility comments: not tested, pt received & left sitting in recliner    Transfers Overall transfer level: Needs assistance Equipment used: Rolling walker (2 wheels) Transfers: Sit to/from Stand Sit to Stand: Supervision, Contact guard assist           General transfer comment: demonstrates good awareness of hand placement during sit<>stand, good eccentric lowering    Ambulation/Gait Ambulation/Gait assistance: Contact guard assist Gait Distance  (Feet): 110 Feet (+ 16 ft) Assistive device: Rolling walker (2 wheels) Gait Pattern/deviations: Decreased step length - right, Decreased step length - left, Decreased stride length, Step-through pattern Gait velocity: decreased     General Gait Details: cuing re: need to ambulate within base of AD, upright posture vs slightly flexed, CGA espeically when turning   Stairs             Wheelchair Mobility     Tilt Bed    Modified Rankin (Stroke Patients Only)       Balance   Sitting-balance support: Feet supported, No upper extremity supported Sitting balance-Leahy Scale: Good Sitting balance - Comments: dons L shoe sitting in recliner without LOB   Standing balance support: Bilateral upper extremity supported, Reliant on assistive device for balance, During functional activity Standing balance-Leahy Scale: Fair                              Hotel manager: Impaired Factors Affecting Communication: Hearing impaired  Cognition Arousal: Alert Behavior During Therapy: WFL for tasks assessed/performed   PT - Cognitive impairments: No apparent impairments                       PT - Cognition Comments: very pleasant, eager to participate   Following commands impaired: Follows one step commands with increased time, Follows multi-step commands with increased time    Cueing Cueing Techniques: Verbal cues  Exercises      General Comments General comments (skin integrity, edema, etc.): wound vac intact throughout session  Pertinent Vitals/Pain Pain Assessment Pain Assessment: No/denies pain    Home Living                          Prior Function            PT Goals (current goals can now be found in the care plan section) Acute Rehab PT Goals Patient Stated Goal: go home, R wound to heal PT Goal Formulation: With patient Time For Goal Achievement: 02/04/24 Potential to Achieve Goals: Good Progress  towards PT goals: Progressing toward goals    Frequency    Min 2X/week      PT Plan      Co-evaluation              AM-PAC PT 6 Clicks Mobility   Outcome Measure  Help needed turning from your back to your side while in a flat bed without using bedrails?: None Help needed moving from lying on your back to sitting on the side of a flat bed without using bedrails?: A Little Help needed moving to and from a bed to a chair (including a wheelchair)?: A Little Help needed standing up from a chair using your arms (e.g., wheelchair or bedside chair)?: A Little Help needed to walk in hospital room?: A Little Help needed climbing 3-5 steps with a railing? : A Lot 6 Click Score: 18    End of Session   Activity Tolerance: Patient tolerated treatment well;Patient limited by fatigue Patient left: in chair;with call bell/phone within reach (infectious MD in room)   PT Visit Diagnosis: Muscle weakness (generalized) (M62.81);Other abnormalities of gait and mobility (R26.89)     Time: 1349-1405 PT Time Calculation (min) (ACUTE ONLY): 16 min  Charges:    $Therapeutic Activity: 8-22 mins PT General Charges $$ ACUTE PT VISIT: 1 Visit                     Richerd Pinal, PT, DPT 01/23/24, 2:10 PM   Richerd CHRISTELLA Pinal 01/23/2024, 2:09 PM

## 2024-01-23 NOTE — TOC Progression Note (Addendum)
 Transition of Care [facility]) - Progression Note    Patient Details  Name: Manuel [name] MRN: [id number] Date of Birth: [birth year]  [facility]) CM/SW Contact  Marinda Cooks, RN Phone Number: 01/23/2024, 12:59 PM  Clinical Narrative:    This CM updated by medical team that pt needs wound vac at dc .This CM spoke with pt introduced role and discussed pt's dc plan for wound vac and HH. Pt confirmed he was in agreement with plan and was provided choice pt confirmed he was open t using [employer] for his wound vac.   This CM coordinated with vascular team provider [facility]  & Mitch at [employer] to have pt's wound vac form completed for ins auth submission will cont to follow dc planning / care coordination and update as applicable.    14:20pm- This CM confirmed with POC Tracy at [facility] pt had his prior wound vac coordinated with this agency , and she was able to provide a new wound vac. This CM updated medical team and alerted Vascular provider of new E script that needs signed . Also HH coordinated with [facility] at DC.  TOC will cont to follow dc planning / care coordination and update as applicable.     Expected Discharge Plan and Services  Home with [facility] and wound vac    Social Drivers of Health (SDOH) Interventions SDOH Screenings   Food Insecurity: No Food Insecurity (01/17/2024)  Housing: Low Risk  (01/17/2024)  Transportation Needs: No Transportation Needs (01/17/2024)  Utilities: Not At Risk (01/17/2024)  Depression (PHQ2-9): High Risk (09/11/2023)  Financial Resource Strain: Low Risk  (07/11/2023)  Social Connections: Moderately Isolated (01/17/2024)  Tobacco Use: Low Risk  (01/16/2024)    Readmission Risk Interventions    12/05/2023    3:09 PM 07/09/2023    8:30 PM 02/18/2023   11:58 AM  Readmission Risk Prevention Plan  Transportation Screening Complete Complete   PCP or Specialist Appt within 3-5 Days   Complete  HRI or Home Care Consult   Complete  Social Work  Consult for Recovery Care Planning/Counseling   Complete  Palliative Care Screening   Not Applicable  Medication Review Oceanographer) Complete Complete Complete  PCP or Specialist appointment within 3-5 days of discharge Complete    HRI or Home Care Consult  Complete   SW Recovery Care/Counseling Consult Complete Complete   Palliative Care Screening Not Applicable Not Applicable   Skilled Nursing Facility Not Applicable Not Applicable

## 2024-01-23 NOTE — Plan of Care (Signed)

## 2024-01-24 DIAGNOSIS — T827XXD Infection and inflammatory reaction due to other cardiac and vascular devices, implants and grafts, subsequent encounter: Secondary | ICD-10-CM | POA: Diagnosis not present

## 2024-01-24 DIAGNOSIS — A498 Other bacterial infections of unspecified site: Secondary | ICD-10-CM

## 2024-01-24 LAB — GLUCOSE, CAPILLARY
Glucose-Capillary: 160 mg/dL — ABNORMAL HIGH (ref 70–99)
Glucose-Capillary: 142 mg/dL — ABNORMAL HIGH (ref 70–99)
Glucose-Capillary: 136 mg/dL — ABNORMAL HIGH (ref 70–99)
Glucose-Capillary: 148 mg/dL — ABNORMAL HIGH (ref 70–99)

## 2024-01-24 LAB — CREATININE, SERUM
Creatinine, Ser: 0.66 mg/dL (ref 0.61–1.24)
GFR, Estimated: >60 mL/min (ref >60)

## 2024-01-24 NOTE — Plan of Care (Signed)

## 2024-01-24 NOTE — Plan of Care (Signed)
  Problem: Education: Goal: Ability to describe self-care measures that may prevent or decrease complications (Diabetes Survival Skills Education) will improve Outcome: Progressing   Problem: Coping: Goal: Ability to adjust to condition or change in health will improve Outcome: Progressing

## 2024-01-24 NOTE — TOC Progression Note (Signed)
 Transition of Care [facility]) - Progression Note    Patient Details  Name: Manuel [name] MRN: [id number] Date of Birth: Dec 12, 1945  Transition of Care [facility]) CM/SW Contact  Marinda Cooks, RN Phone Number: 01/24/2024, 3:20 PM  Clinical Narrative:     This CM reached out to [facility] POC [place] today x 3  to receive updates on pt's wound vac and was informed both times that pt's request is still under review with his ins for approval. TOC will cont to follow dc planning/ care coordination and update as applicable.       Expected Discharge Plan and Services  Home with [facility] & DME / wound vac  Social Drivers of Health (SDOH) Interventions SDOH Screenings   Food Insecurity: No Food Insecurity (01/17/2024)  Housing: Low Risk  (01/17/2024)  Transportation Needs: No Transportation Needs (01/17/2024)  Utilities: Not At Risk (01/17/2024)  Depression (PHQ2-9): High Risk (09/11/2023)  Financial Resource Strain: Low Risk  (07/11/2023)  Social Connections: Moderately Isolated (01/17/2024)  Tobacco Use: Low Risk  (01/16/2024)    Readmission Risk Interventions    12/05/2023    3:09 PM 07/09/2023    8:30 PM 02/18/2023   11:58 AM  Readmission Risk Prevention Plan  Transportation Screening Complete Complete   PCP or Specialist Appt within 3-5 Days   Complete  HRI or Home Care Consult   Complete  Social Work Consult for Recovery Care Planning/Counseling   Complete  Palliative Care Screening   Not Applicable  Medication Review Oceanographer) Complete Complete Complete  PCP or Specialist appointment within 3-5 days of discharge Complete    HRI or Home Care Consult  Complete   SW Recovery Care/Counseling Consult Complete Complete   Palliative Care Screening Not Applicable Not Applicable   Skilled Nursing Facility Not Applicable Not Applicable

## 2024-01-24 NOTE — Hospital Course (Signed)
 Manuel [name] is an 78 year old male with DM, hypertension, OSA, heart failure reduced EF 25 to 30%, CAD status post multiple stents, extensive PAD, carotid artery stenosis with history of revascularization, had recent stent placement to the right femoral artery complicated by femoral artery rupture/repair/wound dehiscence/Serratia infection.  Patient was recently discharged to skilled nursing facility with a wound VAC and ertapenem  via PICC.  He was sent in from the vascular clinic for wound VAC change due to delayed wound healing and difficulty maintaining wound Vac seal at his SNF.  In the ED labs and also are mostly unremarkable.  Vascular surgery was consulted on arrival.  Patient was started on IV antibiotics and wound VAC was changed appropriately.  Infectious disease was consulted and recultured the wound on 10/2.  ID cleared the patient for discharge with p.o. Bactrim  for an additional 7 days at DC.  Patient was medically cleared for discharge as of 10/1, stay was then significantly delayed pending home health wound VAC arrangements.    Delayed wound healing Serratia wound infection Wound dehiscence right groin status post repair of posterior vascular stent Femoral artery rupture Long-term antibiotics via PICC - Vascular surgery consulted, recommends wound VAC changes on Monday and Thursdays - During hospital stay was on ertapenem  and Diflucan.  Will plan to discharge with 7 days of Bactrim  - Infectious disease was consulted, recommendations as above - Repeat wound cultures taken at wound VAC change 10/2, will follow to completion - TOC consulted to assist in home health wound VAC arrangements, currently pending.  All forms have been signed by vascular surgery  - Planning for home health at discharge. - As needed pain meds - PT/OT Home health ordered at DC   Chronic systolic CHF, EF 7974% History of NSVT - Currently euvolemic - Continue home GDMT, titrate as needed   Uncontrolled  type 2 diabetes with hyperglycemia with long-term use of insulin  - Blood glucose elevated on arrival - Continue basal/bolus and sliding scale.  Titrate as needed   History of alcohol  abuse - Continue thiamine    Sleep apnea with nocturnal hypoxia - As needed oxygen at night   BMI 30 Obesity class - Outpatient follow up for lifestyle modification and risk factor management   CAD - Continue home meds

## 2024-01-24 NOTE — Progress Notes (Signed)
 PROGRESS NOTE    [name]  FMW:[id number] DOB: 1946/02/27 DOA: 01/16/2024 PCP: [name], [name], MD  Chief Complaint  Patient presents with   Wound Check    Wound vac unable to get a seal and is not working properly     Hospital Course:  Manuel [name] is an 78 year old male with DM, hypertension, OSA, heart failure reduced EF 25 to 30%, CAD status post multiple stents, extensive PAD, carotid artery stenosis with history of revascularization, had recent stent placement to the right femoral artery complicated by femoral artery rupture/repair/wound dehiscence/Serratia infection.  Patient was recently discharged to skilled nursing facility with a wound VAC and ertapenem  via PICC.  He was sent in from the vascular clinic for wound VAC change due to delayed wound healing and difficulty maintaining wound Vac seal at his SNF.  In the ED labs and also are mostly unremarkable.  Vascular surgery was consulted on arrival. Patient was started on IV antibiotics and wound VAC was changed appropriately.  Infectious disease was consulted and recultured the wound on 10/2.  ID cleared the patient for discharge with p.o. Bactrim  for an additional 7 days at DC.  Patient was medically cleared for discharge as of 10/1, stay was then significantly delayed pending home health wound VAC arrangements.   Subjective: Patient is sleeping easily on exam this morning.  No acute events overnight  Objective: Vitals:   01/23/24 1928 01/23/24 2257 01/24/24 0413 01/24/24 0801  BP: 121/81 (!) 146/74 (!) 118/46 122/70  Pulse: 69 75 79 77  Resp: 18  16 17   Temp: 98.2 F (36.8 C)  98.1 F (36.7 C) 98.3 F (36.8 C)  TempSrc:      SpO2: 93%  96% 96%  Weight:      Height:        Intake/Output Summary (Last 24 hours) at 01/24/2024 1508 Last data filed at 01/24/2024 1414 Gross per 24 hour  Intake 360 ml  Output --  Net 360 ml   Filed Weights   01/16/24 1546  Weight: 90.7 kg    Examination: General exam:  Appears calm and comfortable, NAD  Respiratory system: No work of breathing, symmetric chest wall expansion Cardiovascular system: S1 & S2 heard, RRR.  Gastrointestinal system: Abdomen is nondistended, soft and nontender.   Assessment & Plan:  Principal Problem:   Delayed wound healing Active Problems:   Coronary artery disease with history of myocardial infarction without history of CABG   Chronic systolic CHF (congestive heart failure) (HCC)   Uncontrolled type 2 diabetes mellitus with hyperglycemia, with long-term current use of insulin  (HCC)   Wound dehiscence, right groin   Long term (current) use of antibiotics   Wound infection    Delayed wound healing Serratia wound infection Wound dehiscence right groin status post repair of posterior vascular stent Femoral artery rupture Long-term antibiotics via PICC - Vascular surgery consulted, recommends wound VAC changes on Monday and Thursdays - Currently on ertapenem  and s/p Diflucan x1.  Will plan to discharge with 7 days of Bactrim  - Infectious disease was consulted, recommendations as above - Repeat wound cultures taken at wound VAC change 10/2 - TOC consulted to assist in home health wound VAC arrangements, currently pending.  All forms have been signed by vascular surgery  - Planning for home health at discharge. - As needed pain meds - PT/OT Home health ordered at DC  Chronic systolic CHF, EF 7974% History of NSVT - Currently euvolemic - Continue home GDMT, titrate as  needed  Uncontrolled type 2 diabetes with hyperglycemia with long-term use of insulin  - Blood glucose elevated on arrival - Continue basal/bolus and sliding scale.  Titrate as needed  History of alcohol  abuse - Continue thiamine   Sleep apnea with nocturnal hypoxia - As needed oxygen at night  BMI 30 Obesity class - Outpatient follow up for lifestyle modification and risk factor management  CAD - Continue home meds   DVT prophylaxis: Lovenox     Code Status: Limited: Do not attempt resuscitation (DNR) -DNR-LIMITED -Do Not Intubate/DNI  Disposition: Planning for home health at discharge.  TOC consulted to help with arrangements.  Pending wound VAC delivery and set up  Consultants:    Procedures:    Antimicrobials:  Anti-infectives (From admission, onward)    Start     Dose/Rate Route Frequency Ordered Stop   01/22/24 1000  fluconazole (DIFLUCAN) tablet 200 mg        200 mg Oral Daily 01/22/24 0836     01/17/24 1100  ertapenem  (INVANZ ) 1 g in sodium chloride  0.9 % 100 mL IVPB        1 g 200 mL/hr over 30 Minutes Intravenous Every 24 hours 01/17/24 0255     01/17/24 0115  ertapenem  (INVANZ ) IVPB  Status:  Discontinued        1 g Intravenous Every 24 hours 01/17/24 0105 01/17/24 0254       Data Reviewed: I have personally reviewed following labs and imaging studies CBC: Recent Labs  Lab 01/18/24 0550 01/19/24 0321 01/20/24 0427 01/21/24 0333 01/22/24 0230  WBC 6.3 6.9 7.9 7.6 7.0  HGB 11.3* 11.4* 11.9* 11.1* 12.8*  HCT 34.0* 34.1* 35.6* 33.1* 38.9*  MCV 85.4 85.0 84.8 85.8 86.3  PLT 253 253 256 228 264   Basic Metabolic Panel: Recent Labs  Lab 01/18/24 0550 01/19/24 0321 01/20/24 0427 01/21/24 0333 01/22/24 0230 01/24/24 0327  NA 137 137 136 136 136  --   K 3.8 4.1 4.0 4.2 4.1  --   CL 103 105 103 103 101  --   CO2 25 26 25 25 27   --   GLUCOSE 174* 141* 132* 147* 107*  --   BUN 24* 21 23 26* 29*  --   CREATININE 0.68 0.87 0.76 0.67 0.66 0.66  CALCIUM  8.9 9.0 9.1 8.9 9.4  --   MG 2.1 1.9 2.0  --   --   --   PHOS 3.5 3.3 3.5  --   --   --    GFR: Estimated Creatinine Clearance: 80.5 mL/min (by C-G formula based on SCr of 0.66 mg/dL). Liver Function Tests: No results for input(s): AST, ALT, ALKPHOS, BILITOT, PROT, ALBUMIN in the last 168 hours. CBG: Recent Labs  Lab 01/23/24 1138 01/23/24 1647 01/23/24 2044 01/24/24 0802 01/24/24 1140  GLUCAP 203* 135* 149* 160* 142*    Recent  Results (from the past 240 hours)  MRSA Next Gen by PCR, Nasal     Status: None   Collection Time: 01/19/24  9:29 PM   Specimen: Nasal Mucosa; Nasal Swab  Result Value Ref Range Status   MRSA by PCR Next Gen NOT DETECTED NOT DETECTED Final    Comment: (NOTE) The GeneXpert MRSA Assay (FDA approved for NASAL specimens only), is one component of a comprehensive MRSA colonization surveillance program. It is not intended to diagnose MRSA infection nor to guide or monitor treatment for MRSA infections. Test performance is not FDA approved in patients less than 27 years old. Performed at  [facility], [address] [place] [address], [place], [place] [address]   Aerobic Culture w Gram Stain (superficial specimen)     Status: None (Preliminary result)   Collection Time: 01/22/24  9:00 AM   Specimen: Wound  Result Value Ref Range Status   Specimen Description   Final    WOUND Performed at [facility], [address], [place], [place] [address]    Special Requests   Final    NONE Performed at [facility], [address] [place], [place] [address]    Gram Stain   Final    RARE WBC PRESENT, PREDOMINANTLY PMN RARE GRAM POSITIVE COCCI    Culture   Final    RARE ENTEROCOCCUS FAECIUM RARE STAPHYLOCOCCUS EPIDERMIDIS SUSCEPTIBILITIES TO FOLLOW Performed at [facility], [address], [place], [place] [address]    Report Status PENDING  Incomplete     Radiology Studies: No results found.  Scheduled Meds:  aspirin  EC  81 mg Oral Daily   atorvastatin   80 mg Oral QHS   carvedilol   3.125 mg Oral BID WC   Chlorhexidine  Gluconate Cloth  6 each Topical Daily   clopidogrel   75 mg Oral Daily   dapagliflozin  propanediol  10 mg Oral Daily   enoxaparin  (LOVENOX ) injection  0.5 mg/kg Subcutaneous Q24H   escitalopram   10 mg Oral Daily   ferrous sulfate   325 mg Oral Q breakfast   fluconazole  200 mg Oral Daily   folic acid   1 mg Oral Daily   gabapentin   100 mg Oral BID    insulin  aspart  0-15 Units Subcutaneous TID WC   insulin  aspart  0-5 Units Subcutaneous QHS   pantoprazole   40 mg Oral Daily   polyethylene glycol  17 g Oral Daily   sacubitril -valsartan   1 tablet Oral Q12H   sodium chloride  flush  10-40 mL Intracatheter Q12H   spironolactone   25 mg Oral Daily   thiamine   100 mg Oral Daily   traZODone   100 mg Oral QHS   Continuous Infusions:  ertapenem  1 g (01/24/24 1300)     LOS: 0 days  MDM: Patient is high risk for one or more organ failure.  They necessitate ongoing hospitalization for continued IV therapies and subsequent lab monitoring. Total time spent interpreting labs and vitals, reviewing the medical record, coordinating care amongst consultants and care team members, directly assessing and discussing care with the patient and/or family: 55 min  Ester Mabe, DO [facility]  To contact the attending physician between 7A-7P please use Epic Chat. To contact the covering physician during after hours 7P-7A, please review Amion.  01/24/2024, 3:08 PM   *This document has been created with the assistance of dictation software. Please excuse typographical errors. *

## 2024-01-25 DIAGNOSIS — A498 Other bacterial infections of unspecified site: Secondary | ICD-10-CM | POA: Diagnosis not present

## 2024-01-25 DIAGNOSIS — T827XXD Infection and inflammatory reaction due to other cardiac and vascular devices, implants and grafts, subsequent encounter: Secondary | ICD-10-CM | POA: Diagnosis not present

## 2024-01-25 LAB — GLUCOSE, CAPILLARY
Glucose-Capillary: 147 mg/dL — ABNORMAL HIGH (ref 70–99)
Glucose-Capillary: 157 mg/dL — ABNORMAL HIGH (ref 70–99)
Glucose-Capillary: 160 mg/dL — ABNORMAL HIGH (ref 70–99)
Glucose-Capillary: 190 mg/dL — ABNORMAL HIGH (ref 70–99)

## 2024-01-25 NOTE — TOC Progression Note (Signed)
 Transition of Care [facility]) - Progression Note    Patient Details  Name: Manuel [name] MRN: [id number] Date of Birth: 21-Jan-1946  Transition of Care [facility]) CM/SW Contact  Marinda Cooks, RN Phone Number: 01/25/2024, 3:50 PM  Clinical Narrative:    This CM attempted to reach [employer] POC today x 2 today to get updates on pt's wound vac awaiting a reply back. TOC will cont to follow dc planning / care coordination and update as applicable.     Expected Discharge Plan and Services  To dc home with [facility] and DME wound vac   Social Drivers of Health (SDOH) Interventions SDOH Screenings   Food Insecurity: No Food Insecurity (01/17/2024)  Housing: Low Risk  (01/17/2024)  Transportation Needs: No Transportation Needs (01/17/2024)  Utilities: Not At Risk (01/17/2024)  Depression (PHQ2-9): High Risk (09/11/2023)  Financial Resource Strain: Low Risk  (07/11/2023)  Social Connections: Moderately Isolated (01/17/2024)  Tobacco Use: Low Risk  (01/16/2024)    Readmission Risk Interventions    12/05/2023    3:09 PM 07/09/2023    8:30 PM 02/18/2023   11:58 AM  Readmission Risk Prevention Plan  Transportation Screening Complete Complete   PCP or Specialist Appt within 3-5 Days   Complete  HRI or Home Care Consult   Complete  Social Work Consult for Recovery Care Planning/Counseling   Complete  Palliative Care Screening   Not Applicable  Medication Review Oceanographer) Complete Complete Complete  PCP or Specialist appointment within 3-5 days of discharge Complete    HRI or Home Care Consult  Complete   SW Recovery Care/Counseling Consult Complete Complete   Palliative Care Screening Not Applicable Not Applicable   Skilled Nursing Facility Not Applicable Not Applicable

## 2024-01-25 NOTE — Plan of Care (Signed)
  Problem: Skin Integrity: Goal: Risk for impaired skin integrity will decrease Outcome: Progressing   Problem: Nutrition: Goal: Adequate nutrition will be maintained Outcome: Progressing   Problem: Activity: Goal: Risk for activity intolerance will decrease Outcome: Progressing   Problem: Skin Integrity: Goal: Risk for impaired skin integrity will decrease Outcome: Progressing   Problem: Skin Integrity: Goal: Risk for impaired skin integrity will decrease Outcome: Progressing   Problem: Safety: Goal: Ability to remain free from injury will improve Outcome: Progressing

## 2024-01-25 NOTE — Progress Notes (Signed)
 PROGRESS NOTE    Manuel [name]  FMW:[id number] DOB: Jan 29, 1946 DOA: 01/16/2024 PCP: [name], [name], MD  Chief Complaint  Patient presents with   Wound Check    Wound vac unable to get a seal and is not working properly     Hospital Course:  Manuel [name] is an 78 year old male with DM, hypertension, OSA, heart failure reduced EF 25 to 30%, CAD status post multiple stents, extensive PAD, carotid artery stenosis with history of revascularization, had recent stent placement to the right femoral artery complicated by femoral artery rupture/repair/wound dehiscence/Serratia infection.  Patient was recently discharged to skilled nursing facility with a wound VAC and ertapenem  via PICC.  He was sent in from the vascular clinic for wound VAC change due to delayed wound healing and difficulty maintaining wound Vac seal at his SNF.  In the ED labs and also are mostly unremarkable.  Vascular surgery was consulted on arrival. Patient was started on IV antibiotics and wound VAC was changed appropriately.  Infectious disease was consulted and recultured the wound on 10/2.  ID cleared the patient for discharge with p.o. Bactrim  for an additional 7 days at DC.  Patient was medically cleared for discharge as of 10/1, stay was then significantly delayed pending home health wound VAC arrangements.   Subjective: Acute events overnight.  Patient continues to wait for wound VAC approval and arrangements at home.  He has no acute concerns  Objective: Vitals:   01/24/24 1523 01/24/24 1926 01/25/24 0406 01/25/24 0736  BP: (!) 152/71 (!) 101/43 (!) 118/48 (!) 126/91  Pulse: 75 72 75 73  Resp: 17 18 18 19   Temp: 98 F (36.7 C) 98.6 F (37 C) 98.5 F (36.9 C) 98.3 F (36.8 C)  TempSrc:  Oral Oral   SpO2: 99% 100% 97% 96%  Weight:      Height:        Intake/Output Summary (Last 24 hours) at 01/25/2024 1240 Last data filed at 01/25/2024 0900 Gross per 24 hour  Intake 480 ml  Output 1000 ml  Net -520 ml    Filed Weights   01/16/24 1546  Weight: 90.7 kg    Examination: General exam: Appears calm and comfortable, NAD  Respiratory system: No work of breathing, symmetric chest wall expansion Cardiovascular system: S1 & S2 heard, RRR.  Gastrointestinal system: Abdomen is nondistended, soft and nontender.   Assessment & Plan:  Principal Problem:   Delayed wound healing Active Problems:   Coronary artery disease with history of myocardial infarction without history of CABG   Chronic systolic CHF (congestive heart failure) (HCC)   Uncontrolled type 2 diabetes mellitus with hyperglycemia, with long-term current use of insulin  (HCC)   Wound dehiscence, right groin   Long term (current) use of antibiotics   Wound infection    Delayed wound healing Serratia wound infection Wound dehiscence right groin status post repair of posterior vascular stent Femoral artery rupture Long-term antibiotics via PICC - Vascular surgery consulted, recommends wound VAC changes on Monday and Thursdays - Currently on ertapenem  and s/p Diflucan x1.  Will plan to discharge with 7 days of Bactrim  - Infectious disease was consulted, recommendations as above - Repeat wound cultures taken at wound VAC change 10/2 - TOC consulted to assist in home health wound VAC arrangements, currently pending.  All forms have been signed by vascular surgery  - Planning for home health at discharge. - As needed pain meds - PT/OT Home health ordered at DC  Chronic systolic  CHF, EF 2025% History of NSVT - Currently euvolemic - Continue home GDMT, titrate as needed  Uncontrolled type 2 diabetes with hyperglycemia with long-term use of insulin  - Blood glucose elevated on arrival - Continue basal/bolus and sliding scale.  Titrate as needed  History of alcohol  abuse - Continue thiamine   Sleep apnea with nocturnal hypoxia - As needed oxygen at night  BMI 30 Obesity class - Outpatient follow up for lifestyle modification  and risk factor management  CAD - Continue home meds   DVT prophylaxis: Lovenox    Code Status: Limited: Do not attempt resuscitation (DNR) -DNR-LIMITED -Do Not Intubate/DNI  Disposition: Pending home health wound VAC set up.  Remains medically cleared for discharge since 10/2  Consultants:    Procedures:    Antimicrobials:  Anti-infectives (From admission, onward)    Start     Dose/Rate Route Frequency Ordered Stop   01/22/24 1000  fluconazole (DIFLUCAN) tablet 200 mg        200 mg Oral Daily 01/22/24 0836     01/17/24 1100  ertapenem  (INVANZ ) 1 g in sodium chloride  0.9 % 100 mL IVPB        1 g 200 mL/hr over 30 Minutes Intravenous Every 24 hours 01/17/24 0255     01/17/24 0115  ertapenem  (INVANZ ) IVPB  Status:  Discontinued        1 g Intravenous Every 24 hours 01/17/24 0105 01/17/24 0254       Data Reviewed: I have personally reviewed following labs and imaging studies CBC: Recent Labs  Lab 01/19/24 0321 01/20/24 0427 01/21/24 0333 01/22/24 0230  WBC 6.9 7.9 7.6 7.0  HGB 11.4* 11.9* 11.1* 12.8*  HCT 34.1* 35.6* 33.1* 38.9*  MCV 85.0 84.8 85.8 86.3  PLT 253 256 228 264   Basic Metabolic Panel: Recent Labs  Lab 01/19/24 0321 01/20/24 0427 01/21/24 0333 01/22/24 0230 01/24/24 0327  NA 137 136 136 136  --   K 4.1 4.0 4.2 4.1  --   CL 105 103 103 101  --   CO2 26 25 25 27   --   GLUCOSE 141* 132* 147* 107*  --   BUN 21 23 26* 29*  --   CREATININE 0.87 0.76 0.67 0.66 0.66  CALCIUM  9.0 9.1 8.9 9.4  --   MG 1.9 2.0  --   --   --   PHOS 3.3 3.5  --   --   --    GFR: Estimated Creatinine Clearance: 80.5 mL/min (by C-G formula based on SCr of 0.66 mg/dL). Liver Function Tests: No results for input(s): AST, ALT, ALKPHOS, BILITOT, PROT, ALBUMIN in the last 168 hours. CBG: Recent Labs  Lab 01/24/24 1140 01/24/24 1650 01/24/24 2015 01/25/24 0735 01/25/24 1135  GLUCAP 142* 136* 148* 147* 157*    Recent Results (from the past 240 hours)   MRSA Next Gen by PCR, Nasal     Status: None   Collection Time: 01/19/24  9:29 PM   Specimen: Nasal Mucosa; Nasal Swab  Result Value Ref Range Status   MRSA by PCR Next Gen NOT DETECTED NOT DETECTED Final    Comment: (NOTE) The GeneXpert MRSA Assay (FDA approved for NASAL specimens only), is one component of a comprehensive MRSA colonization surveillance program. It is not intended to diagnose MRSA infection nor to guide or monitor treatment for MRSA infections. Test performance is not FDA approved in patients less than 16 years old. Performed at [facility], [address], [place], [place] [address]  Aerobic Culture w Gram Stain (superficial specimen)     Status: None (Preliminary result)   Collection Time: 01/22/24  9:00 AM   Specimen: Wound  Result Value Ref Range Status   Specimen Description   Final    WOUND Performed at [facility], [address], [place], [place] [address]    Special Requests   Final    NONE Performed at [facility], [address] [place], [place] [address]    Gram Stain   Final    RARE WBC PRESENT, PREDOMINANTLY PMN RARE GRAM POSITIVE COCCI Performed at [facility], [address] [place], [place] [address]    Culture   Final    RARE ENTEROCOCCUS FAECIUM RARE STAPHYLOCOCCUS EPIDERMIDIS RARE GRAM NEGATIVE RODS CULTURE REINCUBATED FOR BETTER GROWTH VANCOMYCIN  RESISTANT ENTEROCOCCUS    Report Status PENDING  Incomplete   Organism ID, Bacteria STAPHYLOCOCCUS EPIDERMIDIS  Final   Organism ID, Bacteria ENTEROCOCCUS FAECIUM  Final      Susceptibility   Enterococcus faecium - MIC*    AMPICILLIN  >=32 RESISTANT Resistant     VANCOMYCIN  >=32 RESISTANT Resistant     GENTAMICIN  SYNERGY SENSITIVE Sensitive     * RARE ENTEROCOCCUS FAECIUM   Staphylococcus epidermidis - MIC*    CIPROFLOXACIN  <=0.5 SENSITIVE Sensitive     ERYTHROMYCIN >=8 RESISTANT Resistant     GENTAMICIN  <=0.5 SENSITIVE Sensitive      OXACILLIN >=4 RESISTANT Resistant     TETRACYCLINE >=16 RESISTANT Resistant     VANCOMYCIN  2 SENSITIVE Sensitive     TRIMETH /SULFA  >=320 RESISTANT Resistant     CLINDAMYCIN >=8 RESISTANT Resistant     RIFAMPIN <=0.5 SENSITIVE Sensitive     Inducible Clindamycin NEGATIVE Sensitive     * RARE STAPHYLOCOCCUS EPIDERMIDIS     Radiology Studies: No results found.  Scheduled Meds:  aspirin  EC  81 mg Oral Daily   atorvastatin   80 mg Oral QHS   carvedilol   3.125 mg Oral BID WC   Chlorhexidine  Gluconate Cloth  6 each Topical Daily   clopidogrel   75 mg Oral Daily   dapagliflozin  propanediol  10 mg Oral Daily   enoxaparin  (LOVENOX ) injection  0.5 mg/kg Subcutaneous Q24H   escitalopram   10 mg Oral Daily   ferrous sulfate   325 mg Oral Q breakfast   fluconazole  200 mg Oral Daily   folic acid   1 mg Oral Daily   gabapentin   100 mg Oral BID   insulin  aspart  0-15 Units Subcutaneous TID WC   insulin  aspart  0-5 Units Subcutaneous QHS   pantoprazole   40 mg Oral Daily   polyethylene glycol  17 g Oral Daily   sacubitril -valsartan   1 tablet Oral Q12H   sodium chloride  flush  10-40 mL Intracatheter Q12H   spironolactone   25 mg Oral Daily   thiamine   100 mg Oral Daily   traZODone   100 mg Oral QHS   Continuous Infusions:  ertapenem  1 g (01/25/24 1111)     LOS: 0 days  MDM: Patient is high risk for one or more organ failure.  They necessitate ongoing hospitalization for continued IV therapies and subsequent lab monitoring. Total time spent interpreting labs and vitals, reviewing the medical record, coordinating care amongst consultants and care team members, directly assessing and discussing care with the patient and/or family: 55 min  Flay Ghosh, DO [facility]  To contact the attending physician between 7A-7P please use Epic Chat. To contact the covering physician during after hours 7P-7A, please  review Amion.  01/25/2024, 12:40 PM   *This document has been created with the  assistance of dictation software. Please excuse typographical errors. *

## 2024-01-25 NOTE — Plan of Care (Signed)
  Problem: Education: Goal: Ability to describe self-care measures that may prevent or decrease complications (Diabetes Survival Skills Education) will improve Outcome: Progressing   Problem: Coping: Goal: Ability to adjust to condition or change in health will improve Outcome: Progressing   Problem: Nutritional: Goal: Maintenance of adequate nutrition will improve Outcome: Progressing   Problem: Clinical Measurements: Goal: Diagnostic test results will improve Outcome: Progressing   Problem: Activity: Goal: Risk for activity intolerance will decrease Outcome: Progressing   Problem: Pain Managment: Goal: General experience of comfort will improve and/or be controlled Outcome: Progressing

## 2024-01-26 ENCOUNTER — Other Ambulatory Visit: Payer: Self-pay

## 2024-01-26 DIAGNOSIS — T8130XA Disruption of wound, unspecified, initial encounter: Secondary | ICD-10-CM

## 2024-01-26 DIAGNOSIS — I5022 Chronic systolic (congestive) heart failure: Secondary | ICD-10-CM | POA: Diagnosis not present

## 2024-01-26 DIAGNOSIS — I2510 Atherosclerotic heart disease of native coronary artery without angina pectoris: Secondary | ICD-10-CM | POA: Diagnosis not present

## 2024-01-26 DIAGNOSIS — Z794 Long term (current) use of insulin: Secondary | ICD-10-CM

## 2024-01-26 DIAGNOSIS — E1165 Type 2 diabetes mellitus with hyperglycemia: Secondary | ICD-10-CM | POA: Diagnosis not present

## 2024-01-26 DIAGNOSIS — T148XXA Other injury of unspecified body region, initial encounter: Secondary | ICD-10-CM

## 2024-01-26 DIAGNOSIS — Z792 Long term (current) use of antibiotics: Secondary | ICD-10-CM

## 2024-01-26 DIAGNOSIS — I252 Old myocardial infarction: Secondary | ICD-10-CM

## 2024-01-26 DIAGNOSIS — T148XXD Other injury of unspecified body region, subsequent encounter: Secondary | ICD-10-CM | POA: Diagnosis not present

## 2024-01-26 DIAGNOSIS — L089 Local infection of the skin and subcutaneous tissue, unspecified: Secondary | ICD-10-CM

## 2024-01-26 LAB — GLUCOSE, CAPILLARY
Glucose-Capillary: 155 mg/dL — ABNORMAL HIGH (ref 70–99)
Glucose-Capillary: 147 mg/dL — ABNORMAL HIGH (ref 70–99)

## 2024-01-26 MED ORDER — INSULIN ASPART PROT & ASPART (70-30 MIX) 100 UNIT/ML PEN: 3.0000 [IU] | PEN_INJECTOR | Freq: Two times a day (BID) | SUBCUTANEOUS | Status: AC

## 2024-01-26 MED ORDER — SULFAMETHOXAZOLE-TRIMETHOPRIM 800-160 MG PO TABS
1.0000 | ORAL_TABLET | Freq: Two times a day (BID) | ORAL | 0 refills | Status: AC
  Filled 2024-01-26: qty 8, 4d supply, fill #0

## 2024-01-26 MED ORDER — HYDROCODONE-ACETAMINOPHEN 5-325 MG PO TABS
1.0000 | ORAL_TABLET | ORAL | 0 refills | Status: AC | PRN
  Filled 2024-01-26: qty 15, 2d supply, fill #0

## 2024-01-26 NOTE — Progress Notes (Signed)
 Physical Therapy Treatment Patient Details Name: Manuel [name] MRN: [id number] DOB: [birth year] Today's Date: 01/26/2024   History of Present Illness Pt is an 78 y/o M admitted on 01/16/24 after presenting from wound clinic for evaluation of delayed wound healing & wound vac difficulty maintaining a seal. PMH: DM, HTN, OSA, HFrEF, CAD s/p multiple stents, extensive PAD/carotid artery stenosis with hx of revascularization    PT Comments  Pt EOB with prosthesis donned.  He stands and self selects gait 100' with RW and cga x 1.  No external assist from writer for LOB/buckling.  Slight imbalances at times but is able to self correct.  Gait remains somewhat flexed with cues to stand upright.   Feels comfortable with mobility and discharge home.   If plan is discharge home, recommend the following: A little help with walking and/or transfers;A little help with bathing/dressing/bathroom;Assistance with cooking/housework;Assist for transportation;Help with stairs or ramp for entrance   Can travel by private vehicle        Equipment Recommendations  None recommended by PT    Recommendations for Other Services       Precautions / Restrictions Precautions Precautions: Fall Recall of Precautions/Restrictions: Intact Precaution/Restrictions Comments: RLE prosthesis, R groin wound vac Restrictions Weight Bearing Restrictions Per Provider Order: No     Mobility  Bed Mobility Overal bed mobility: Modified Independent                  Transfers Overall transfer level: Needs assistance Equipment used: Rolling walker (2 wheels) Transfers: Sit to/from Stand Sit to Stand: Supervision                Ambulation/Gait Ambulation/Gait assistance: Contact guard assist Gait Distance (Feet): 100 Feet Assistive device: Rolling walker (2 wheels) Gait Pattern/deviations: Decreased step length - right, Decreased step length - left, Decreased stride length, Step-through pattern, Trunk  flexed Gait velocity: decreased         Stairs             Wheelchair Mobility     Tilt Bed    Modified Rankin (Stroke Patients Only)       Balance Overall balance assessment: Needs assistance Sitting-balance support: Feet supported, No upper extremity supported Sitting balance-Leahy Scale: Good     Standing balance support: Bilateral upper extremity supported, Reliant on assistive device for balance, During functional activity Standing balance-Leahy Scale: Fair                              Hotel manager: Impaired Factors Affecting Communication: Hearing impaired  Cognition Arousal: Alert Behavior During Therapy: WFL for tasks assessed/performed   PT - Cognitive impairments: No apparent impairments                       PT - Cognition Comments: very pleasant, eager to participate Following commands: Intact Following commands impaired: Only follows one step commands consistently    Cueing Cueing Techniques: Verbal cues  Exercises      General Comments        Pertinent Vitals/Pain Pain Assessment Pain Assessment: Faces Faces Pain Scale: Hurts a little bit Pain Location: R groin Pain Descriptors / Indicators: Sore Pain Intervention(s): Limited activity within patient's tolerance, Monitored during session, Repositioned    Home Living                          Prior  Function            PT Goals (current goals can now be found in the care plan section) Progress towards PT goals: Progressing toward goals    Frequency    Min 2X/week      PT Plan      Co-evaluation              AM-PAC PT 6 Clicks Mobility   Outcome Measure  Help needed turning from your back to your side while in a flat bed without using bedrails?: None Help needed moving from lying on your back to sitting on the side of a flat bed without using bedrails?: None Help needed moving to and from a bed to a  chair (including a wheelchair)?: A Little Help needed standing up from a chair using your arms (e.g., wheelchair or bedside chair)?: A Little Help needed to walk in hospital room?: A Little Help needed climbing 3-5 steps with a railing? : A Lot 6 Click Score: 19    End of Session Equipment Utilized During Treatment: Gait belt Activity Tolerance: Patient tolerated treatment well;Patient limited by fatigue Patient left: in chair;with call bell/phone within reach Nurse Communication: Mobility status PT Visit Diagnosis: Muscle weakness (generalized) (M62.81);Other abnormalities of gait and mobility (R26.89)     Time: 9146-9094 PT Time Calculation (min) (ACUTE ONLY): 12 min  Charges:    $Gait Training: 8-22 mins PT General Charges $$ ACUTE PT VISIT: 1 Visit                   Lauraine Gills, PTA 01/26/24, 9:13 AM

## 2024-01-26 NOTE — Discharge Summary (Signed)
 DISCHARGE SUMMARY    Manuel [name] FMW:[id number] DOB: 1945/08/26 DOA: 01/16/2024  PCP: [name], [name], MD  Admit date: 01/16/2024 Discharge date: 01/26/2024   Recommendations for Outpatient Follow-up:  Follow up with PCP in 1-2 weeks for diabetes management Follow up with vascular surgery as scheduled   Hospital Course: Manuel [name] is an 78 year old male with DM, hypertension, OSA, heart failure reduced EF 25 to 30%, CAD status post multiple stents, extensive PAD, carotid artery stenosis with history of revascularization, had recent stent placement to the right femoral artery complicated by femoral artery rupture/repair/wound dehiscence/Serratia infection.  Patient was recently discharged to skilled nursing facility with a wound VAC and ertapenem  via PICC.  He was sent in from the vascular clinic for wound VAC change due to delayed wound healing and difficulty maintaining wound Vac seal at his SNF.  In the ED labs and also are mostly unremarkable.  Vascular surgery was consulted on arrival.  Patient was started on IV antibiotics and wound VAC was changed appropriately.  Infectious disease was consulted and recultured the wound on 10/2.  ID cleared the patient for discharge with p.o. Bactrim  for an additional 7 days at DC.  Patient was medically cleared for discharge as of 10/1, stay was then significantly delayed pending home health wound VAC arrangements.  Wound VAC was successfully delivered and arranged and patient is being discharged home on 10/6.  He will need to complete an additional 4 days of Bactrim  per infectious disease.  Meds to beds delivered medications prior to discharge.    Delayed wound healing Serratia wound infection Wound dehiscence right groin status post repair of posterior vascular stent Femoral artery rupture Long-term antibiotics via PICC - Vascular surgery consulted, recommends wound VAC changes on Monday and Thursdays - During hospital stay was on  ertapenem  and Diflucan.  Discharging with Bactrim  through 10/10 - Infectious disease was consulted, recommendations as above -Home health and wound VAC arranged   Chronic systolic CHF, EF 7974% History of NSVT - Currently euvolemic - Continue home GDMT, titrate as needed   Uncontrolled type 2 diabetes with hyperglycemia with long-term use of insulin  -Patient takes 70/30 at home.  Has required only sliding scale here.  Sending home on very modest dose of 70/30 per patient request - Encouraged him to follow-up with primary care for further diabetes management   History of alcohol  abuse - Continue thiamine    Sleep apnea with nocturnal hypoxia - As needed oxygen at night   BMI 30 Obesity class - Outpatient follow up for lifestyle modification and risk factor management   CAD - Continue home meds   Discharge Instructions  Discharge Instructions     Call MD for:  difficulty breathing, headache or visual disturbances   Complete by: As directed    Call MD for:  persistant dizziness or light-headedness   Complete by: As directed    Call MD for:  persistant nausea and vomiting   Complete by: As directed    Call MD for:  severe uncontrolled pain   Complete by: As directed    Call MD for:  temperature >100.4   Complete by: As directed    Diet general   Complete by: As directed    Discharge instructions   Complete by: As directed    Follow up with your primary care physician to discuss the medication changes during this admission, including diabetes management. Follow up with vascular surgery as scheduled   Discharge wound care:   Complete by:  As directed    WOC team will change Vac Q Mon/Thurs. Use barrier ring around edges and in crease near wound.   Cleanse L great toe, L lower leg and L knee wounds with NS, apply Xeroform gauze to wound beds every other day and secure with dry gauze and tape or silicone foam whichever is preferred.   Increase activity slowly   Complete by:  As directed       Allergies as of 01/26/2024   No Known Allergies      Medication List     STOP taking these medications    Ensure Max Protein Liqd   ertapenem  IVPB Commonly known as: INVANZ    insulin  aspart 100 UNIT/ML injection Commonly known as: novoLOG    methocarbamol  500 MG tablet Commonly known as: ROBAXIN    ondansetron  4 MG tablet Commonly known as: ZOFRAN        TAKE these medications    artificial tears ophthalmic solution Place 1 drop into both eyes as needed for dry eyes.   aspirin  EC 81 MG tablet Take 1 tablet (81 mg total) by mouth daily.   atorvastatin  80 MG tablet Commonly known as: LIPITOR  Take 1 tablet (80 mg total) by mouth at bedtime.   carvedilol  3.125 MG tablet Commonly known as: COREG  Take 1 tablet (3.125 mg total) by mouth 2 (two) times daily with a meal.   clopidogrel  75 MG tablet Commonly known as: PLAVIX  Take 1 tablet (75 mg total) by mouth daily.   dapagliflozin  propanediol 10 MG Tabs tablet Commonly known as: FARXIGA  Take 1 tablet (10 mg total) by mouth daily.   escitalopram  10 MG tablet Commonly known as: LEXAPRO  Take 1 tablet (10 mg total) by mouth daily.   ferrous sulfate  325 (65 FE) MG EC tablet Take 325 mg by mouth daily with breakfast.   fluticasone  50 MCG/ACT nasal spray Commonly known as: FLONASE  Place 1 spray into both nostrils 2 (two) times daily as needed for allergies or rhinitis.   folic acid  1 MG tablet Commonly known as: FOLVITE  Take 1 tablet (1 mg total) by mouth daily.   gabapentin  100 MG capsule Commonly known as: NEURONTIN  1 po at bedtime.   HYDROcodone -acetaminophen  5-325 MG tablet Commonly known as: NORCO/VICODIN Take 1-2 tablets by mouth every 4 (four) hours as needed for up to 5 days for severe pain (pain score 7-10).   insulin  aspart protamine  - aspart (70-30) 100 UNIT/ML FlexPen Commonly known as: NOVOLOG  70/30 MIX Inject 3 Units into the skin 2 (two) times daily with a meal.    loratadine  10 MG tablet Commonly known as: CLARITIN  Take 1 tablet (10 mg total) by mouth daily as needed for allergies.   nitroGLYCERIN  0.4 MG SL tablet Commonly known as: Nitrostat  Place 1 tablet (0.4 mg total) under the tongue every 5 (five) minutes as needed for chest pain. Max: 3 tablets per day   pantoprazole  40 MG tablet Commonly known as: PROTONIX  Take 1 tablet (40 mg total) by mouth daily.   polyethylene glycol 17 g packet Commonly known as: MIRALAX  / GLYCOLAX  Take 17 g by mouth daily.   sacubitril -valsartan  24-26 MG Commonly known as: ENTRESTO  Take 1 tablet by mouth every 12 (twelve) hours.   spironolactone  25 MG tablet Commonly known as: ALDACTONE  Take 1 tablet (25 mg total) by mouth daily.   sulfamethoxazole -trimethoprim  800-160 MG tablet Commonly known as: Bactrim  DS Take 1 tablet by mouth 2 (two) times daily for 4 days.   thiamine  100 MG tablet Commonly known as:  Vitamin B-1 Take 1 tablet (100 mg total) by mouth daily.   traZODone  100 MG tablet Commonly known as: DESYREL  Take 1 tablet (100 mg total) by mouth at bedtime.               Durable Medical Equipment  (From admission, onward)           Start     Ordered   01/22/24 0913  For home use only DME Negative pressure wound device  Once       Question Answer Comment  Frequency of dressing change 3 times per week   Length of need 3 Months   Dressing type Foam   Amount of suction 125 mm/Hg   Pressure application Continuous pressure   Supplies 10 canisters and 15 dressings per month for duration of therapy      01/22/24 0912              Discharge Care Instructions  (From admission, onward)           Start     Ordered   01/26/24 0000  Discharge wound care:       Comments: WOC team will change Vac Q Mon/Thurs. Use barrier ring around edges and in crease near wound.   Cleanse L great toe, L lower leg and L knee wounds with NS, apply Xeroform gauze to wound beds every other day  and secure with dry gauze and tape or silicone foam whichever is preferred.   01/26/24 1207            Follow-up Information     Pace, Gwendlyn R, NP Follow up in 4 week(s).   Specialty: Vascular Surgery Why: Right Groin Wound Check. Patient needs to bring to the visit a set of the wound vac supplies to eevaluate the wound and change the wound vac. If he does not bring supplies with him the visit will be rescheduled to the first available Contact information: [address] [place] [address] [phone]                No Known Allergies  Consultations:    Procedures/Studies: DG Chest Port 1 View Result Date: 01/17/2024 EXAM: 1 VIEW(S) XRAY OF THE CHEST 01/17/2024 06:20:11 PM COMPARISON: 12/12/2023 CLINICAL HISTORY: PICC (peripherally inserted central catheter) in place FINDINGS: LINES, TUBES AND DEVICES: Right PICC line placement with tip in mid SVC. Endotracheal and nasogastric tubes removed. LUNGS AND PLEURA: Low lung volumes causing crowding of pulmonary vasculature. No focal pulmonary opacity. No pulmonary edema. No pleural effusion. No pneumothorax. HEART AND MEDIASTINUM: Atherosclerotic calcification of aortic arch. Cardiomegaly. BONES AND SOFT TISSUES: Old right rib deformities from healed fractures. Bilateral degenerative glenohumeral arthropathy. No acute osseous abnormality. IMPRESSION: 1. Right PICC line appropriately positioned with tip in the mid SVC. 2. Cardiomegaly. 3. Low lung volumes with vascular crowding. Electronically signed by: Norman Gatlin MD 01/17/2024 06:38 PM EDT RP Workstation: [id number]      Discharge Exam: Vitals:   01/26/24 0354 01/26/24 0815  BP: 111/60 123/71  Pulse: 65 70  Resp: 18 18  Temp: 97.7 F (36.5 C) 98 F (36.7 C)  SpO2: 96% 100%   Vitals:   01/25/24 1946 01/25/24 2132 01/26/24 0354 01/26/24 0815  BP: (!) 177/95 (!) 153/86 111/60 123/71  Pulse: 84 75 65 70  Resp: 18 18 18 18   Temp: 97.8 F (36.6 C)   97.7 F (36.5 C) 98 F (36.7 C)  TempSrc:  SpO2: 100% 99% 96% 100%  Weight:      Height:        General exam: Appears calm and comfortable, NAD  Respiratory system: No work of breathing, symmetric chest wall expansion Cardiovascular system: S1 & S2 heard, RRR.  Gastrointestinal system: Abdomen is nondistended, soft and nontender.    The results of significant diagnostics from this hospitalization (including imaging, microbiology, ancillary and laboratory) are listed below for reference.     Microbiology: Recent Results (from the past 240 hours)  MRSA Next Gen by PCR, Nasal     Status: None   Collection Time: 01/19/24  9:29 PM   Specimen: Nasal Mucosa; Nasal Swab  Result Value Ref Range Status   MRSA by PCR Next Gen NOT DETECTED NOT DETECTED Final    Comment: (NOTE) The GeneXpert MRSA Assay (FDA approved for NASAL specimens only), is one component of a comprehensive MRSA colonization surveillance program. It is not intended to diagnose MRSA infection nor to guide or monitor treatment for MRSA infections. Test performance is not FDA approved in patients less than 80 years old. Performed at [facility], [address], [place], [place] [address]   Aerobic Culture w Gram Stain (superficial specimen)     Status: None (Preliminary result)   Collection Time: 01/22/24  9:00 AM   Specimen: Wound  Result Value Ref Range Status   Specimen Description   Final    WOUND Performed at [facility], [address] [place], [place] [address]    Special Requests   Final    NONE Performed at [facility], [address] [place], [place] [address]    Gram Stain   Final    RARE WBC PRESENT, PREDOMINANTLY PMN RARE GRAM POSITIVE COCCI Performed at [facility], [address], [place], [place] [address]    Culture   Final    RARE ENTEROCOCCUS FAECIUM RARE STAPHYLOCOCCUS EPIDERMIDIS RARE PSEUDOMONAS AERUGINOSA SUSCEPTIBILITIES TO  FOLLOW VANCOMYCIN  RESISTANT ENTEROCOCCUS ISOLATED    Report Status PENDING  Incomplete   Organism ID, Bacteria STAPHYLOCOCCUS EPIDERMIDIS  Final   Organism ID, Bacteria ENTEROCOCCUS FAECIUM  Final      Susceptibility   Enterococcus faecium - MIC*    AMPICILLIN  >=32 RESISTANT Resistant     VANCOMYCIN  >=32 RESISTANT Resistant     GENTAMICIN  SYNERGY SENSITIVE Sensitive     * RARE ENTEROCOCCUS FAECIUM   Staphylococcus epidermidis - MIC*    CIPROFLOXACIN  <=0.5 SENSITIVE Sensitive     ERYTHROMYCIN >=8 RESISTANT Resistant     GENTAMICIN  <=0.5 SENSITIVE Sensitive     OXACILLIN >=4 RESISTANT Resistant     TETRACYCLINE >=16 RESISTANT Resistant     VANCOMYCIN  2 SENSITIVE Sensitive     TRIMETH /SULFA  >=320 RESISTANT Resistant     CLINDAMYCIN >=8 RESISTANT Resistant     RIFAMPIN <=0.5 SENSITIVE Sensitive     Inducible Clindamycin NEGATIVE Sensitive     * RARE STAPHYLOCOCCUS EPIDERMIDIS     Labs: BNP (last 3 results) Recent Labs    02/14/23 1720 07/09/23 1152 12/04/23 2147  BNP 445.2* 2,395.3* 3,374.0*   Basic Metabolic Panel: Recent Labs  Lab 01/20/24 0427 01/21/24 0333 01/22/24 0230 01/24/24 0327  NA 136 136 136  --   K 4.0 4.2 4.1  --   CL 103 103 101  --   CO2 25 25 27   --   GLUCOSE 132* 147* 107*  --   BUN 23 26* 29*  --   CREATININE 0.76 0.67 0.66  0.66  CALCIUM  9.1 8.9 9.4  --   MG 2.0  --   --   --   PHOS 3.5  --   --   --    Liver Function Tests: No results for input(s): AST, ALT, ALKPHOS, BILITOT, PROT, ALBUMIN in the last 168 hours. No results for input(s): LIPASE, AMYLASE in the last 168 hours. No results for input(s): AMMONIA in the last 168 hours. CBC: Recent Labs  Lab 01/20/24 0427 01/21/24 0333 01/22/24 0230  WBC 7.9 7.6 7.0  HGB 11.9* 11.1* 12.8*  HCT 35.6* 33.1* 38.9*  MCV 84.8 85.8 86.3  PLT 256 228 264   Cardiac Enzymes: No results for input(s): CKTOTAL, CKMB, CKMBINDEX, TROPONINI in the last 168 hours. BNP: Invalid  input(s): POCBNP CBG: Recent Labs  Lab 01/25/24 1135 01/25/24 1709 01/25/24 2026 01/26/24 0809 01/26/24 1145  GLUCAP 157* 160* 190* 155* 147*   D-Dimer No results for input(s): DDIMER in the last 72 hours. Hgb A1c No results for input(s): HGBA1C in the last 72 hours. Lipid Profile No results for input(s): CHOL, HDL, LDLCALC, TRIG, CHOLHDL, LDLDIRECT in the last 72 hours. Thyroid function studies No results for input(s): TSH, T4TOTAL, T3FREE, THYROIDAB in the last 72 hours.  Invalid input(s): FREET3 Anemia work up No results for input(s): VITAMINB12, FOLATE, FERRITIN, TIBC, IRON , RETICCTPCT in the last 72 hours. Urinalysis    Component Value Date/Time   COLORURINE YELLOW (A) 12/04/2023 1141   APPEARANCEUR CLEAR (A) 12/04/2023 1141   LABSPEC 1.023 12/04/2023 1141   PHURINE 5.0 12/04/2023 1141   GLUCOSEU >=500 (A) 12/04/2023 1141   HGBUR SMALL (A) 12/04/2023 1141   BILIRUBINUR NEGATIVE 12/04/2023 1141   KETONESUR 20 (A) 12/04/2023 1141   PROTEINUR >=300 (A) 12/04/2023 1141   NITRITE NEGATIVE 12/04/2023 1141   LEUKOCYTESUR NEGATIVE 12/04/2023 1141   Sepsis Labs Recent Labs  Lab 01/20/24 0427 01/21/24 0333 01/22/24 0230  WBC 7.9 7.6 7.0   Microbiology Recent Results (from the past 240 hours)  MRSA Next Gen by PCR, Nasal     Status: None   Collection Time: 01/19/24  9:29 PM   Specimen: Nasal Mucosa; Nasal Swab  Result Value Ref Range Status   MRSA by PCR Next Gen NOT DETECTED NOT DETECTED Final    Comment: (NOTE) The GeneXpert MRSA Assay (FDA approved for NASAL specimens only), is one component of a comprehensive MRSA colonization surveillance program. It is not intended to diagnose MRSA infection nor to guide or monitor treatment for MRSA infections. Test performance is not FDA approved in patients less than 55 years old. Performed at [facility], [address] [place], [place] [address]   Aerobic Culture w  Gram Stain (superficial specimen)     Status: None (Preliminary result)   Collection Time: 01/22/24  9:00 AM   Specimen: Wound  Result Value Ref Range Status   Specimen Description   Final    WOUND Performed at [facility], [address] [place], [place] [address]    Special Requests   Final    NONE Performed at [facility], [address] [place], [place] [address]    Gram Stain   Final    RARE WBC PRESENT, PREDOMINANTLY PMN RARE GRAM POSITIVE COCCI Performed at [facility], [address] [place], [place] [address]    Culture   Final    RARE ENTEROCOCCUS FAECIUM RARE STAPHYLOCOCCUS EPIDERMIDIS RARE PSEUDOMONAS AERUGINOSA SUSCEPTIBILITIES TO FOLLOW VANCOMYCIN  RESISTANT ENTEROCOCCUS ISOLATED  Report Status PENDING  Incomplete   Organism ID, Bacteria STAPHYLOCOCCUS EPIDERMIDIS  Final   Organism ID, Bacteria ENTEROCOCCUS FAECIUM  Final      Susceptibility   Enterococcus faecium - MIC*    AMPICILLIN  >=32 RESISTANT Resistant     VANCOMYCIN  >=32 RESISTANT Resistant     GENTAMICIN  SYNERGY SENSITIVE Sensitive     * RARE ENTEROCOCCUS FAECIUM   Staphylococcus epidermidis - MIC*    CIPROFLOXACIN  <=0.5 SENSITIVE Sensitive     ERYTHROMYCIN >=8 RESISTANT Resistant     GENTAMICIN  <=0.5 SENSITIVE Sensitive     OXACILLIN >=4 RESISTANT Resistant     TETRACYCLINE >=16 RESISTANT Resistant     VANCOMYCIN  2 SENSITIVE Sensitive     TRIMETH /SULFA  >=320 RESISTANT Resistant     CLINDAMYCIN >=8 RESISTANT Resistant     RIFAMPIN <=0.5 SENSITIVE Sensitive     Inducible Clindamycin NEGATIVE Sensitive     * RARE STAPHYLOCOCCUS EPIDERMIDIS     Time coordinating discharge: 32 min   SIGNED: Teala Daffron, DO [facility] 01/26/2024, 12:08 PM Pager   If 7PM-7AM, please contact night-coverage

## 2024-01-26 NOTE — Consult Note (Addendum)
 WOC Nurse wound follow up Wound type: Surgical. Infected arteria. Pressure Injury POA: NA Measurement: 10 x 3 x 0.5 cm (tunneling). Wound bed: 95% red, granulated tissue. 5% yellow. Drainage (amount, consistency, odor) Moderate amount. 50 ml on canister at 1200 of serosanguinous drainage. Periwound: redness, moist in crease at 3 and 9 o'clock. Skin tear on right side. Dressing procedure/placement/frequency: Removed old NPWT dressing Cleansed wound with normal saline Used skin prep barrier pad #[id number]. Filled wound with 1 piece of black foam   Sealed NPWT dressing at HG Patient tolerated procedure well.   WOC nurse will continue to provide NPWT dressing changed due to the complexity of the dressing change.    Note: Plans to dc soon, attached the dressing to a home health machine with bed nurse in room.    WOC team will not plan to follow further. Please reconsult if further assistance is needed. Thank-you,  Lela Holm RN, CNS, [employer], MSN.  (Phone [phone])

## 2024-01-26 NOTE — TOC Transition Note (Signed)
 Transition of Care [facility]) - Discharge Note   Patient Details  Name: Manuel [name] MRN: [id number] Date of Birth: 1945-09-24  [facility] CM/SW Contact:  Alvaro Louder, LCSW Phone Number: 01/26/2024, 12:21 PM   Clinical Narrative:   Wound Vac delivered. LCSWA reached out to [facility] admissions coordinator and started service for patient including HH PT, OT, and RN.   TOC to follow for discharge.           Patient Goals and CMS Choice            Discharge Placement                       Discharge Plan and Services Additional resources added to the After Visit Summary for                                       Social Drivers of Health (SDOH) Interventions SDOH Screenings   Food Insecurity: No Food Insecurity (01/17/2024)  Housing: Low Risk  (01/17/2024)  Transportation Needs: No Transportation Needs (01/17/2024)  Utilities: Not At Risk (01/17/2024)  Depression (PHQ2-9): High Risk (09/11/2023)  Financial Resource Strain: Low Risk  (07/11/2023)  Social Connections: Moderately Isolated (01/17/2024)  Tobacco Use: Low Risk  (01/16/2024)     Readmission Risk Interventions    12/05/2023    3:09 PM 07/09/2023    8:30 PM 02/18/2023   11:58 AM  Readmission Risk Prevention Plan  Transportation Screening Complete Complete   PCP or Specialist Appt within 3-5 Days   Complete  HRI or Home Care Consult   Complete  Social Work Consult for Recovery Care Planning/Counseling   Complete  Palliative Care Screening   Not Applicable  Medication Review Oceanographer) Complete Complete Complete  PCP or Specialist appointment within 3-5 days of discharge Complete    HRI or Home Care Consult  Complete   SW Recovery Care/Counseling Consult Complete Complete   Palliative Care Screening Not Applicable Not Applicable   Skilled Nursing Facility Not Applicable Not Applicable

## 2024-01-26 NOTE — Plan of Care (Signed)
  Problem: Education: Goal: Ability to describe self-care measures that may prevent or decrease complications (Diabetes Survival Skills Education) will improve Outcome: Progressing   Problem: Coping: Goal: Ability to adjust to condition or change in health will improve Outcome: Progressing   Problem: Metabolic: Goal: Ability to maintain appropriate glucose levels will improve Outcome: Progressing   Problem: Nutritional: Goal: Maintenance of adequate nutrition will improve Outcome: Progressing   Problem: Clinical Measurements: Goal: Diagnostic test results will improve Outcome: Progressing   Problem: Activity: Goal: Risk for activity intolerance will decrease Outcome: Progressing   Problem: Coping: Goal: Level of anxiety will decrease Outcome: Progressing

## 2024-01-26 NOTE — Progress Notes (Addendum)
 Progress Note    01/26/2024 9:37 AM * No surgery found *  Subjective:  Manuel [name] is an 78 yo male who presented to the emergency department after being seen in vein and vascular clinic last Friday on 01/16/2024.  Patient was having difficulties with the facility he was staying in being able to handle his need for a wound VAC.  Patient currently has a wound VAC to his right groin for postoperative infection to control hemorrhage to his right femoral.  Patient also continues on IV antibiotics as he has been getting them for 6 weeks outpatient.   Patient wife at the bedside this morning reports that the wound care nurse was in and changed the wound VAC yesterday morning (01/20/24).  Wound care nurse recommendations are for the patient to continue the wound VAC at this time.  Patient was loaded into media files of the wound prior to applying a new wound VAC today.  No signs or symptoms of infection.  Beefy red healthy tissue is growing in the wound bed.  No other complaints overnight.  Wound VAC is sealed well vitals all remained stable.   Vitals:   01/26/24 0354 01/26/24 0815  BP: 111/60 123/71  Pulse: 65 70  Resp: 18 18  Temp: 97.7 F (36.5 C) 98 F (36.7 C)  SpO2: 96% 100%   Physical Exam: Cardiac:  RRR, normal S1 and S2.  No murmurs appreciated. Lungs: Nonlabored breathing, clear on auscultation throughout, no rales rhonchi or wheezing. Incisions: Right groin postoperative surgical incision with wound VAC placement.  Picture of wound media files today.  No signs or symptoms of infection hematoma or seroma. Extremities: Patient with a history of right BKA.  Left lower extremity warm to touch with palpable pulses. Abdomen: Positive bowel sounds throughout, soft, nontender nondistended. Neurologic: Alert and oriented x 3, answers questions follows commands appropriately.  CBC    Component Value Date/Time   WBC 7.0 01/22/2024 0230   RBC 4.51 01/22/2024 0230   HGB 12.8 (L)  01/22/2024 0230   HGB 15.7 02/16/2012 2013   HCT 38.9 (L) 01/22/2024 0230   HCT 44.4 02/16/2012 2013   PLT 264 01/22/2024 0230   PLT 176 02/16/2012 2013   MCV 86.3 01/22/2024 0230   MCV 91 02/16/2012 2013   MCH 28.4 01/22/2024 0230   MCHC 32.9 01/22/2024 0230   RDW 14.2 01/22/2024 0230   RDW 13.0 02/16/2012 2013   LYMPHSABS 0.5 (L) 12/04/2023 1141   MONOABS 0.5 12/04/2023 1141   EOSABS 0.1 12/04/2023 1141   BASOSABS 0.0 12/04/2023 1141    BMET    Component Value Date/Time   NA 136 01/22/2024 0230   NA 137 07/21/2023 1246   NA 137 02/16/2012 2013   K 4.1 01/22/2024 0230   K 3.8 02/16/2012 2013   CL 101 01/22/2024 0230   CL 102 02/16/2012 2013   CO2 27 01/22/2024 0230   CO2 25 02/16/2012 2013   GLUCOSE 107 (H) 01/22/2024 0230   GLUCOSE 154 (H) 02/16/2012 2013   BUN 29 (H) 01/22/2024 0230   BUN 30 (H) 07/21/2023 1246   BUN 10 02/16/2012 2013   CREATININE 0.66 01/24/2024 0327   CREATININE 0.72 02/16/2012 2013   CALCIUM  9.4 01/22/2024 0230   CALCIUM  9.6 02/16/2012 2013   GFRNONAA >60 01/24/2024 0327   GFRNONAA >60 02/16/2012 2013   GFRAA >60 12/09/2018 0929   GFRAA >60 02/16/2012 2013    INR    Component Value Date/Time   INR 1.3 (  H) 12/12/2023 9183     Intake/Output Summary (Last 24 hours) at 01/26/2024 0937 Last data filed at 01/26/2024 9271 Gross per 24 hour  Intake 360 ml  Output 2200 ml  Net -1840 ml     Assessment/Plan:  78 y.o. male is s/p right femoral endarterectomy hemorrhage with surgical repair and wound VAC placement.  * No surgery found *   PLAN Home Wound vac has been delivered to the bedside. I plugged it in to charge so he will be able to be discharged on battery.  Patient will be discharged today and return to clinic as scheduled.  Patient instructed to bring a set of supplies to his follow up clinic appointment in order to evaluate the wound and change the wound vac.    DVT prophylaxis:  Aspirin  81 mg daily, Plavix  75 mg daily and  Lipitor  40    Renelda Kilian [facility] 01/26/2024 9:37 AM

## 2024-01-28 ENCOUNTER — Telehealth: Payer: Self-pay

## 2024-01-28 ENCOUNTER — Other Ambulatory Visit: Payer: Self-pay | Admitting: Family Medicine

## 2024-01-28 ENCOUNTER — Telehealth: Payer: Self-pay | Admitting: Family Medicine

## 2024-01-28 NOTE — Telephone Encounter (Signed)
 He is to very depressed.  He has gotten home from the hospital and has been home 2 days.  He has a great deal of difficulty putting on his prosthetic and getting to a standing position.  He easily loses his balance and falls. Spoke to his wife he is on TMP-SMX and the culture results were sent here indicate that the Staph epidermidis is resistant.  Called infectious disease and left a message with them concerning this.Dr. Donald Berlin at [phone].

## 2024-01-28 NOTE — Telephone Encounter (Signed)
 Received voicemail from patient's PCP, Dr. Susan [name] requesting call back from provider to discuss culture results and treatment plan. Cultures show resistance to Bactrim , which is what he is currently prescribed.   Dr. [name] phone [phone]  Katerin Negrete, BSN, RN

## 2024-01-28 NOTE — Telephone Encounter (Signed)
 Copied from CRM [id number]. Topic: Clinical - Medication Refill >> Jan 28, 2024  4:14 PM Joesph B wrote: Medication:  traZODone  (DESYREL ) 100 MG tablet (this was prescribed in the hospital)   escitalopram  (LEXAPRO ) 10 MG tablet  Has the patient contacted their pharmacy? No (Agent: If no, request that the patient contact the pharmacy for the refill. If patient does not wish to contact the pharmacy document the reason why and proceed with request.) (Agent: If yes, when and what did the pharmacy advise?)  This is the patient's preferred pharmacy:  [id number] [place], [place] - [address] AT [facility] OF [address] [place] [address] Phone: [phone] Fax: [phone]    Is this the correct pharmacy for this prescription? Yes If no, delete pharmacy and type the correct one.   Has the prescription been filled recently? No  Is the patient out of the medication? No  Has the patient been seen for an appointment in the last year OR does the patient have an upcoming appointment? Yes  Can we respond through MyChart? Yes  Agent: Please be advised that Rx refills may take up to 3 business days. We ask that you follow-up with your pharmacy.

## 2024-01-29 MED ORDER — ESCITALOPRAM OXALATE 10 MG PO TABS: 10.0000 mg | ORAL_TABLET | Freq: Every day | ORAL | Status: DC

## 2024-01-29 MED ORDER — TRAZODONE HCL 100 MG PO TABS: 100.0000 mg | ORAL_TABLET | Freq: Every day | ORAL | Status: DC

## 2024-01-29 NOTE — Telephone Encounter (Signed)
 Are refills before next office visit appropriate? Last refill provided by ED on 01/01/24. Please advise.

## 2024-01-30 ENCOUNTER — Telehealth (INDEPENDENT_AMBULATORY_CARE_PROVIDER_SITE_OTHER): Payer: Self-pay

## 2024-01-30 NOTE — Telephone Encounter (Signed)
 Mandy([facility]) called in reference to patient concerning his wound appears to be developing at tunnel at the top of the wound site. Patient has follow up appointment 02/23/24 in office and requesting patient to be sooner.  Verbally spoke to Orvin Daring, NP who suggested to get patient in next week to see Gwendlyn and for [facility] nurse to use white foam on wound at this time until patient is seen in office.   Lorn was contacted at this time, advice was given and told her the office would be in contact with patient in reference to appointment date and time for next week.

## 2024-02-02 NOTE — Telephone Encounter (Signed)
 Patient contacted at this time to follow up voice message left on Friday in reference to wound check recommended per home health nurse and Orvin Daring, NP. Patient was made aware he is scheduled Thursday 10/16 @ 1430 to be seen in office.

## 2024-02-03 ENCOUNTER — Ambulatory Visit: Attending: Cardiology | Admitting: Cardiology

## 2024-02-03 ENCOUNTER — Inpatient Hospital Stay: Admitting: Infectious Diseases

## 2024-02-03 ENCOUNTER — Emergency Department
Admission: EM | Admit: 2024-02-03 | Discharge: 2024-02-03 | Disposition: A | Discharge status: Home / Self Care | Attending: Emergency Medicine | Admitting: Emergency Medicine

## 2024-02-03 ENCOUNTER — Other Ambulatory Visit: Payer: Self-pay

## 2024-02-03 ENCOUNTER — Encounter: Payer: Self-pay | Admitting: Cardiology

## 2024-02-03 VITALS — BP 69/41 | HR 60

## 2024-02-03 DIAGNOSIS — I959 Hypotension, unspecified: Secondary | ICD-10-CM | POA: Insufficient documentation

## 2024-02-03 DIAGNOSIS — I5022 Chronic systolic (congestive) heart failure: Secondary | ICD-10-CM | POA: Diagnosis not present

## 2024-02-03 DIAGNOSIS — I252 Old myocardial infarction: Secondary | ICD-10-CM

## 2024-02-03 DIAGNOSIS — E860 Dehydration: Secondary | ICD-10-CM | POA: Diagnosis not present

## 2024-02-03 DIAGNOSIS — E875 Hyperkalemia: Secondary | ICD-10-CM | POA: Insufficient documentation

## 2024-02-03 DIAGNOSIS — E782 Mixed hyperlipidemia: Secondary | ICD-10-CM

## 2024-02-03 DIAGNOSIS — I10 Essential (primary) hypertension: Secondary | ICD-10-CM | POA: Diagnosis not present

## 2024-02-03 DIAGNOSIS — I2510 Atherosclerotic heart disease of native coronary artery without angina pectoris: Secondary | ICD-10-CM | POA: Diagnosis not present

## 2024-02-03 DIAGNOSIS — I739 Peripheral vascular disease, unspecified: Secondary | ICD-10-CM | POA: Diagnosis not present

## 2024-02-03 DIAGNOSIS — I509 Heart failure, unspecified: Secondary | ICD-10-CM | POA: Diagnosis not present

## 2024-02-03 LAB — CBC
HCT: 38.8 % — ABNORMAL LOW (ref 39.0–52.0)
Hemoglobin: 12.8 g/dL — ABNORMAL LOW (ref 13.0–17.0)
MCH: 28.4 pg (ref 26.0–34.0)
MCHC: 33 g/dL (ref 30.0–36.0)
MCV: 86.2 fL (ref 80.0–100.0)
Platelets: 251 K/uL (ref 150–400)
RBC: 4.5 MIL/uL (ref 4.22–5.81)
RDW: 14.5 % (ref 11.5–15.5)
WBC: 9.1 K/uL (ref 4.0–10.5)
nRBC: 0 % (ref 0.0–0.2)

## 2024-02-03 LAB — BASIC METABOLIC PANEL WITH GFR
Anion gap: 9 (ref 5–15)
BUN: 44 mg/dL — ABNORMAL HIGH (ref 8–23)
CO2: 22 mmol/L (ref 22–32)
Calcium: 9.5 mg/dL (ref 8.9–10.3)
Chloride: 103 mmol/L (ref 98–111)
Creatinine, Ser: 0.97 mg/dL (ref 0.61–1.24)
GFR, Estimated: >60 mL/min
Glucose, Bld: 125 mg/dL — ABNORMAL HIGH (ref 70–99)
Potassium: 5.5 mmol/L — ABNORMAL HIGH (ref 3.5–5.1)
Sodium: 134 mmol/L — ABNORMAL LOW (ref 135–145)

## 2024-02-03 LAB — LACTIC ACID, PLASMA
Lactic Acid, Venous: 1.5 mmol/L (ref 0.5–1.9)
Lactic Acid, Venous: 1.1 mmol/L (ref 0.5–1.9)

## 2024-02-03 LAB — TROPONIN I (HIGH SENSITIVITY)
Troponin I (High Sensitivity): 21 ng/L — ABNORMAL HIGH (ref <18)
Troponin I (High Sensitivity): 20 ng/L — ABNORMAL HIGH (ref <18)

## 2024-02-03 MED ORDER — PANTOPRAZOLE SODIUM 40 MG PO TBEC: 40.0000 mg | DELAYED_RELEASE_TABLET | Freq: Every day | ORAL | 2 refills | Status: AC

## 2024-02-03 MED ORDER — SODIUM CHLORIDE 0.9 % IV BOLUS
1000.0000 mL | Freq: Once | INTRAVENOUS | Status: AC
  Administered 2024-02-03: 1000 mL via INTRAVENOUS

## 2024-02-03 MED ORDER — SODIUM ZIRCONIUM CYCLOSILICATE 10 G PO PACK
10.0000 g | PACK | Freq: Once | ORAL | Status: AC
  Administered 2024-02-03: 10 g via ORAL
  Filled 2024-02-03: qty 1

## 2024-02-03 MED ORDER — CARVEDILOL 3.125 MG PO TABS: 3.1250 mg | ORAL_TABLET | Freq: Two times a day (BID) | ORAL | 3 refills | Status: AC

## 2024-02-03 NOTE — Telephone Encounter (Unsigned)
 Copied from CRM [id number]. Topic: Clinical - Home Health Verbal Orders >> Feb 02, 2024  4:53 PM Harlene ORN wrote:  Manuel [name] [facility]  Wanted to let the PCP that they did the evaluation. The patient is not in need of Occupational Therapy services. He needs physical therapy services and a nurse.  Phone: [phone]

## 2024-02-03 NOTE — Discharge Instructions (Addendum)
 You had signs of dehydration and elevated potassium levels of 5.5 given a dose of Lokelma  to help lower your potassium and some IV fluids.  You should hold your spironolactone  as this can increase your potassium levels and can also cause some dehydration.  You should take your blood pressure once in the morning once at night and discuss this further with your primary care doctor and have a recheck of your potassium in 2-3 days.   Return to ER for return of low blood pressures, fevers, redness or any other concern

## 2024-02-03 NOTE — ED Triage Notes (Signed)
 Pt to ED via POV from heart clinic. Pt with recent right femoral artery rupture and has wound vac in place. Pt's wound vac ran out of battery last pm and has not been on. Pt's BP 68/41. Pt reports doesn't feel quite right and feels swimmy headed. Pt took BP meds this am.

## 2024-02-03 NOTE — ED Notes (Signed)
 Pt given DC instructions with wife at bedside. Both verbalized understanding of medication and follow up care. Pt taken from ED in wheelchair. NAD noted at time of departure.

## 2024-02-03 NOTE — ED Provider Notes (Signed)
 [facility] Provider Note    Event Date/Time   First MD Initiated Contact with Patient 02/03/24 1536     (approximate)   History   Hypotension   HPI  Manuel [name] is a 78 y.o. male with history of coronary disease status post PCI, peripheral artery disease with most recent right BKA, chronic heart failure, coronary artery disease, alcohol  abuse, depression who comes in with concerns for hypotension.  Patient was being seen at cardiology clinic today.  I reviewed the note from today from [facility].  Patient had recent admission to the hospital secondary to delayed wound healing of amputation and was started on antibiotics and had a wound VAC change.  And patient was discharged home on 10/6.  He has reported some increased weakness, and feeling like he was going to pass out.  According to patient he states that he actually has been just feeling weak and fatigued for weeks now since having had the surgery.  He denies actually being any worse today it was just that they noticed his blood pressure was low in clinic and so they wanted him to be evaluated.  He denies any acute changes today however no fevers.  He reports that his pump was not working last night because he forgot to charge it but he feels he has been working recently.  He does report that there was some concerns with the site where it was healing that they wanted him to be seen by vascular team and he is get an appointment on Thursday.    Physical Exam   Triage Vital Signs: ED Triage Vitals [02/03/24 1459]  Encounter Vitals Group     BP 101/65     Girls Systolic BP Percentile      Girls Diastolic BP Percentile      Boys Systolic BP Percentile      Boys Diastolic BP Percentile      Pulse Rate 61     Resp 18     Temp 98.1 F (36.7 C)     Temp Source Oral     SpO2 99 %     Weight      Height      Head Circumference      Peak Flow      Pain Score 2     Pain Loc      Pain Education       Exclude from Growth Chart     Most recent vital signs: Vitals:   02/03/24 1459  BP: 101/65  Pulse: 61  Resp: 18  Temp: 98.1 F (36.7 C)  SpO2: 99%     General: Awake, no distress.  CV:  Good peripheral perfusion.  Resp:  Normal effort.  Abd:  No distention.  Soft and nontender Other:  Wound VAC in place without any redness or erythema.  Stump without any redness or erythema   ED Results / Procedures / Treatments   Labs (all labs ordered are listed, but only abnormal results are displayed) Labs Reviewed  CBC - Abnormal; Notable for the following components:      Result Value   Hemoglobin 12.8 (*)    HCT 38.8 (*)    All other components within normal limits  BASIC METABOLIC PANEL WITH GFR - Abnormal; Notable for the following components:   Sodium 134 (*)    Potassium 5.5 (*)    Glucose, Bld 125 (*)    BUN 44 (*)    All other components  within normal limits  TROPONIN I (HIGH SENSITIVITY) - Abnormal; Notable for the following components:   Troponin I (High Sensitivity) 21 (*)    All other components within normal limits  TROPONIN I (HIGH SENSITIVITY) - Abnormal; Notable for the following components:   Troponin I (High Sensitivity) 20 (*)    All other components within normal limits  LACTIC ACID, PLASMA  LACTIC ACID, PLASMA     EKG  My interpretation of EKG:  Normal sinus rate 62 without any ST elevation or T wave inversions after aVL, normal intervals    PROCEDURES:  Critical Care performed  .1-3 Lead EKG Interpretation  Performed by: Ernest Ronal BRAVO, MD Authorized by: Ernest Ronal BRAVO, MD     Interpretation: normal     ECG rate:  60   ECG rate assessment: normal     Rhythm: sinus rhythm     Ectopy: none     Conduction: normal   .Critical Care  Performed by: Ernest Ronal BRAVO, MD Authorized by: Ernest Ronal BRAVO, MD   Critical care provider statement:    Critical care time (minutes):  30   Critical care was necessary to treat or prevent imminent or  life-threatening deterioration of the following conditions: hyperkalemia.   Critical care was time spent personally by me on the following activities:  Development of treatment plan with patient or surrogate, discussions with consultants, evaluation of patient's response to treatment, examination of patient, ordering and review of laboratory studies, ordering and review of radiographic studies, ordering and performing treatments and interventions, pulse oximetry, re-evaluation of patient's condition and review of old charts    MEDICATIONS ORDERED IN ED: Medications  sodium chloride  0.9 % bolus 1,000 mL (0 mLs Intravenous Stopped 02/03/24 1703)  sodium zirconium cyclosilicate  (LOKELMA ) packet 10 g (10 g Oral Given 02/03/24 1653)     IMPRESSION / MDM / ASSESSMENT AND PLAN / ED COURSE  I reviewed the triage vital signs and the nursing notes.   Patient's presentation is most consistent with acute presentation with potential threat to life or bodily function.   Patient comes in with hypotension differential includes Electra abnormalities, AKI, infection.  No evidence of infection on examination, arrhythmia, ACS.  However he denied any chest pain, shortness of breath.  CBC shows hemoglobin that is at baseline. He denies any black stools.  White count is stable at 9.  BMP shows elevated potassium of 5.5.  Patient does appear dehydrated with elevated BUN of 44.  Lactate is normal.  Patient's troponins are flat and downtrending significantly from his recent admission.  Patient was given some fluids and dose of Lokelma  due to his slightly elevated potassium.  Discussed this with patient to stay well-hydrated to have his potassium rechecked outpatient and he expressed understanding.  4:09 PM Dicussed with dr dew from vascular- recommended just leaving wound vac on and will see in clinic on Thursday.  6:05 PM\ Reevaluated patient he remains asymptomatic blood pressures have been stable feeling improved  after fluids.  Patient's blood pressure medications were reviewed and he is on multiple blood pressure medications and we discussed holding the spironolactone  as this could be causing the hyperkalemia and some dehydration and he has a close follow-up with primary care doctor tomorrow to reevaluate blood pressures.  The patient is on the cardiac monitor to evaluate for evidence of arrhythmia and/or significant heart rate changes.      FINAL CLINICAL IMPRESSION(S) / ED DIAGNOSES   Final diagnoses:  Dehydration  Hypotension, unspecified  hypotension type  Hyperkalemia     Rx / DC Orders   ED Discharge Orders     None        Note:  This document was prepared using Dragon voice recognition software and may include unintentional dictation errors.   Ernest Ronal BRAVO, MD 02/03/24 [phone]

## 2024-02-03 NOTE — Progress Notes (Signed)
 Cardiology Office Note   Date:  02/03/2024  ID:  Manuel, [name] 23-Jul-1945, MRN [id number] PCP: [name], [name], MD  [facility] Providers Cardiologist:  None     History of Present Illness Manuel [name] is a 78 y.o. 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    Previously been followed by [name] cardiology establishing care with our office during hospital admission in 06/2023.  He was diagnosed with CAD in 2011 which at that time required 3 drug-eluting stents to the RCA.  He was historically followed by [name] cardiology and [name] advanced heart failure clinic in the setting of progressive worsening LV dysfunction.  In 10/2018 an echocardiogram revealed an LVEF of 40 to 45%.  This was followed by stress testing which showed inferior infarct with minimal peri-infarct ischemia.  He was medically managed.  He was subsequently admitted 10/2022 in the setting of critical limb ischemia with admission complicated by dyspnea and heart failure.  Additional echo showed a drop in EF to 25-30%.  He was evaluated by [name] cardiology at that time with recommendation of ongoing GDMT with diuresis and otherwise conservative management.  He was readmitted in 12/2022 with volume overload and demand ischemia and was again conservatively managed.  Again was readmitted 03/2023 with altered mental status, sepsis, and osteomyelitis with chronic right BKA.  Postop course was complicated by right knee septic/gouty arthritis requiring I&D.  He was admitted 06/2023 with progressive shortness of breath that been present for  at least the preceding year with associated orthopnea, lower extremity swelling, and abdominal distention.  He was initially consulted on by [facility] cardiology, they requested a second opinion and care was transferred to [name] health cardiology.  Echocardiogram showed a persistent cardiomyopathy with an EF of 20-25%.  Initial peak high-sensitivity troponin at 1045.  Given symptoms persisted and downtrending cardiomyopathy and elevated troponin he underwent R/LHC on 07/10/2023 that showed severe three-vessel CAD with successful PCI/DES with 3 overlapping DES to the LAD.  It was felt the patient was not a candidate for CABG.  Remaining Details included occluded proximal RCA with left to right collaterals, 95% left circumflex stenosis, 70% ramus intermedius stenosis, 30% distal left main stenosis, and 99% D1 stenosis.  He was hospitalized at [name] 8/14 - 01/01/2024 presenting after mechanical fall and right femoral artery rupture with large femoral hematoma with active extravasation on CTA.  Vascular surgery was consulted on 8/14.  Ongoing management per vascular surgery including wound VAC changes throughout the admission.  He was to be discharged with a wound VAC with follow-up outpatient with vascular.  He remained hemodynamically stable.  During hospitalization he did require 3 units of PRBCs.  He was found to have severe sepsis from [name] bacteremia prior to arrival.  He was treated with IV antibiotics until 10/2 per ID recommendations.  He was sent to TCU with a PICC line to complete his treatment his last day of antibiotics was going to be 01/22/2024.  PT/OT evaluated and recommended SNF at DC due to decreased mobility from baseline.   He was hospitalized at [name] again 01/15/09/6/25 after recent discharge to skilled nursing facility with wound VAC.  He was sent to the  vascular clinic for wound VAC change due to delayed wound healing and difficulty maintaining wound VAC seal at his SNF.  In the emergency  department labs were also mostly unremarkable.  Vascular surgeon was consulted on arrival.  Patient was started on IV antibiotics and wound VAC was changed appropriately.  Infectious disease was consulted and recultured the wound on 10 2.  ID cleared the patient for discharge with p.o. Bactrim  for an additional 7 days on DC.  Patient was medically cleared for discharge as of 10/1.  Stay was significantly delayed pending home health wound VAC arrangements.  Wound VAC was successfully delivered and arranged and the patient was discharged home on 10/6.   He returns to clinic today accompanied by family member.  Unfortunately blood pressure is extremely low today.  Patient states he is tired he is sleeping like he has extreme weakness and no energy and is just not feeling well.  He has pale and diaphoretic.  His wound VAC the battery is dead and has not been functioning overnight.  States that he has taking his medications.  States that he has been calling to have follow-up appointments with vascular.  Also has upcoming appointment with his PCP.  States that he has been receiving a large quantity of antibiotics and overall is just not feeling well today.  Also states that he has double and blurry vision that feels like tunnel vision and feels as though that he may pass out.   ROS: 10 point review of systems has been reviewed and considered negative except the ones been listed in the HPI  Studies Reviewed EKG Interpretation Date/Time:  Tuesday February 03 2024 14:30:30 EDT Ventricular Rate:  61 PR Interval:  218 QRS Duration:  100 QT Interval:  434 QTC Calculation: 436 R Axis:   33  Text Interpretation: Sinus rhythm with 1st degree A-V block Possible Inferior infarct , age undetermined ST & T wave abnormality, consider lateral ischemia When compared with ECG of 22-Jan-2024 10:30, Borderline criteria for Inferior infarct are now Present Non-specific change in ST segment in Lateral leads T wave inversion  now evident in Lateral leads Confirmed by Gerard Frederick ([id number]) on 02/03/2024 3:10:31 PM    R/LHC 07/14/2023:   Ost RCA to Prox RCA lesion is 100% stenosed.   Ramus lesion is 70% stenosed.   1st Diag lesion is 99% stenosed.   Prox Cx lesion is 95% stenosed.   Dist LM lesion is 30% stenosed.   Mid LAD lesion is 90% stenosed.   Prox LAD to Mid LAD lesion is 80% stenosed.   Ost LAD to Prox LAD lesion is 50% stenosed.   A drug-eluting stent was successfully placed using a STENT ONYX FRONTIER 2.25X38.   A drug-eluting stent was successfully placed using a STENT ONYX FRONTIER 2.5X26.   A drug-eluting stent was successfully placed using a STENT ONYX FRONTIER 2.75X15.   Post intervention, there is a 0% residual stenosis.   Post intervention, there is a 0% residual stenosis.   Post intervention, there is a 0% residual stenosis.   1.  Severe three-vessel coronary artery disease. 2.  Left ventricular angiography was not performed.  EF was severely reduced by echo. 3.  Right heart catheterization showed moderately elevated right and left-sided filling pressures, moderate to severe pulmonary hypertension and moderately reduced cardiac output. 4.  Successful angioplasty and 3 overlapped drug-eluting stent placement to the LAD.  Extremely difficult procedure due to tortuosity and calcifications.  There was a proximal edge  dissection that required adding a third stent all the way back to the ostium of the LAD.  The patient had significant chest pain and hypotension with the telescope catheter in place.  He was started on nitroglycerin  drip at the end.   Recommendations: I felt that the patient was not a candidate for CABG and thus I proceeded with LAD PCI.  Will transfer the patient to stepdown ICU given chest pain postprocedure and will use nitroglycerin  drip.  Recommend medical therapy for the rest of his coronary artery disease. Continue IV diuresis for heart failure.   2D echo 07/10/2023: 1. Left  ventricular ejection fraction, by estimation, is 25 to 30%. The  left ventricle has severely decreased function. The left ventricle  demonstrates regional wall motion abnormalities (see scoring  diagram/findings for description). The left  ventricular internal cavity size was moderately dilated. Left ventricular  diastolic parameters were normal. The global longitudinal strain is  normal.   2. Right ventricular systolic function is normal. The right ventricular  size is normal. There is normal pulmonary artery systolic pressure.   3. The mitral valve is normal in structure. Mild mitral valve  regurgitation. No evidence of mitral stenosis.   4. Tricuspid valve regurgitation is mild to moderate.   5. The aortic valve is normal in structure. Aortic valve regurgitation is  not visualized. Aortic valve sclerosis is present, with no evidence of  aortic valve stenosis.   6. The inferior vena cava is normal in size with greater than 50%  respiratory variability, suggesting right atrial pressure of 3 mmHg.    2D echo 11/14/2022: 1. Left ventricular ejection fraction, by estimation, is 25 to 30%. The  left ventricle has severely decreased function. The left ventricle  demonstrates regional wall motion abnormalities (see scoring  diagram/findings for description). Left ventricular  diastolic parameters were normal.   2. Right ventricular systolic function is normal. The right ventricular  size is normal.   3. The mitral valve is normal in structure. Mild to moderate mitral valve  regurgitation. No evidence of mitral stenosis.   4. Tricuspid valve regurgitation is mild to moderate.   5. The aortic valve is normal in structure. Aortic valve regurgitation is  not visualized. No aortic stenosis is present.   6. The inferior vena cava is normal in size with greater than 50%  respiratory variability, suggesting right atrial pressure of 3 mmHg.    Lexiscan MPI 11/10/2018: LVEF= 45%   FINDINGS:   Regional wall motion:  demonstrates  hypokinesis of the inferior wall.  The overall quality of the study is fair.   Artifacts noted: no  Left ventricular cavity: normal.   Perfusion Analysis:  SPECT images demonstrate moderate perfusion  abnormality of moderate intensity is present in the inferior region on the  stress images.  Resting images show moderate perfusion abnormality of  moderate intensity of the inferior myocardial region with slight  improvements consistent with previous infarct and/or scar as well as with  minimal amount of myocardial ischemia and reversibility defect type :  Mixed      2D echo 11/10/2018: INTERPRETATION  MILD LV SYSTOLIC DYSFUNCTION WITH AN ESTIMATED EF = 40-45 %  NORMAL RIGHT VENTRICULAR SYSTOLIC FUNCTION  MILD-TO-MODERATE MITRAL VALVE INSUFFICIENCY  TRACE TRICUSPID VALVE INSUFFICIENCY  NO VALVULAR STENOSIS  INFERIOR WALL HYPOKINESIS    2D echo 04/20/2014: INTERPRETATION  NORMAL LEFT VENTRICULAR SYSTOLIC FUNCTION  NORMAL RIGHT VENTRICULAR SYSTOLIC FUNCTION  MILD VALVULAR REGURGITATION (See above)  NO VALVULAR STENOSIS  LHC 04/19/2010: SUMMARY:  1. Heavy calcification throughout the coronary system.  2. Ramus intermedius with proximal 80% narrowing.  3. Large first diagonal branch with a 50% to 70% proximal narrowing.  4. Heavily calcified and complex tortuous mid right coronary artery lesion    with multiple sequential 90% lesions.  This was successfully dilated and    stented using overlapping 3 mm Xience stents of 23 mm and 12 mm    postdilated to 3.4 mm.   Risk Assessment/Calculations           Physical Exam VS:  BP (!) 69/41 (BP Location: Left Arm, Patient Position: Sitting, Cuff Size: Normal)   Pulse 60   SpO2 98%        Wt Readings from Last 3 Encounters:  01/16/24 200 lb (90.7 kg)  12/24/23 204 lb 12.9 oz (92.9 kg)  11/18/23 181 lb 8 oz (82.3 kg)    GEN: Well nourished, well developed in no acute distress NECK: No JVD;  No carotid bruits CARDIAC: RRR, I/VI systolic murmur RUSB without, rubs or gallops RESPIRATORY:  Clear to auscultation without rales, wheezing or rhonchi  ABDOMEN: Soft, non-tender, non-distended EXTREMITIES:  No edema; RBKA, Right groin wound vac  ASSESSMENT AND PLAN Coronary artery disease, native coronary arteries, without symptoms of angina.  EKG today reveals sinus rhythm with first gravy block with rate of 61 with ST-T wave changes.  Has underlying CAD.  States that he has been compliant with his current medication regimen.  Continued on aspirin  and atorvastatin .  With hypotension and orthostatic symptoms he is being sent to the emergency department for further evaluation.  HFrEF secondary to ICM currently appears euvolemic.  GDMT is being held in the setting of hypotension.  To be restarted once blood pressure is stable after evaluation in the emergency department.  PAD status post right BKA complicated by mechanical fall leading to right common femoral artery hematoma with extravasation s/p repair by vascular surgery.  Recommend CBC in the emergency department.  Wound VAC currently is not functioning due to dead battery.  AAA noted on CT imaging measuring 3.2 cm with radiology recommended follow-up every 3 years recommend optimal blood pressure control.   Hypertension with hypotension with a blood pressure of 69/41.  Patient is symptomatic with dizziness lightheadedness and feeling of potentially blacking out.  Hyperlipidemia continued on PTA atorvastatin .       Dispo: Patient return to clinic to see MD/APP in 2 to 3 weeks after evaluation in the emergency department or sooner if needed for further evaluation.  Signed, Karishma Unrein, NP

## 2024-02-04 ENCOUNTER — Ambulatory Visit (INDEPENDENT_AMBULATORY_CARE_PROVIDER_SITE_OTHER): Admitting: Family Medicine

## 2024-02-04 ENCOUNTER — Encounter: Payer: Self-pay | Admitting: Family Medicine

## 2024-02-04 VITALS — BP 111/64 | HR 72 | Temp 98.2°F | Resp 18 | Ht 68.0 in | Wt 180.0 lb

## 2024-02-04 DIAGNOSIS — F5101 Primary insomnia: Secondary | ICD-10-CM

## 2024-02-04 DIAGNOSIS — E119 Type 2 diabetes mellitus without complications: Secondary | ICD-10-CM | POA: Diagnosis not present

## 2024-02-04 DIAGNOSIS — F339 Major depressive disorder, recurrent, unspecified: Secondary | ICD-10-CM | POA: Diagnosis not present

## 2024-02-04 DIAGNOSIS — Z794 Long term (current) use of insulin: Secondary | ICD-10-CM | POA: Diagnosis not present

## 2024-02-04 DIAGNOSIS — Z23 Encounter for immunization: Secondary | ICD-10-CM | POA: Diagnosis not present

## 2024-02-04 DIAGNOSIS — E118 Type 2 diabetes mellitus with unspecified complications: Secondary | ICD-10-CM | POA: Diagnosis not present

## 2024-02-04 DIAGNOSIS — E861 Hypovolemia: Secondary | ICD-10-CM

## 2024-02-04 LAB — POCT GLYCOSYLATED HEMOGLOBIN (HGB A1C): Hemoglobin A1C: 6.4 % — AB (ref 4.0–5.6)

## 2024-02-04 MED ORDER — FLUOXETINE HCL 20 MG PO CAPS
20.0000 mg | ORAL_CAPSULE | Freq: Every day | ORAL | 3 refills | Status: AC
Start: 2024-02-04 — End: ?

## 2024-02-04 MED ORDER — COVID-19 MRNA VAC-TRIS(PFIZER) 30 MCG/0.3ML IM SUSY: 0.3000 mL | PREFILLED_SYRINGE | Freq: Once | INTRAMUSCULAR | 0 refills | Status: AC

## 2024-02-04 MED ORDER — GABAPENTIN 100 MG PO CAPS: 100.0000 mg | ORAL_CAPSULE | Freq: Every day | ORAL | 3 refills | Status: DC

## 2024-02-05 ENCOUNTER — Encounter (INDEPENDENT_AMBULATORY_CARE_PROVIDER_SITE_OTHER): Payer: Self-pay | Admitting: Vascular Surgery

## 2024-02-05 ENCOUNTER — Other Ambulatory Visit (INDEPENDENT_AMBULATORY_CARE_PROVIDER_SITE_OTHER): Payer: Self-pay | Admitting: Vascular Surgery

## 2024-02-05 ENCOUNTER — Ambulatory Visit (INDEPENDENT_AMBULATORY_CARE_PROVIDER_SITE_OTHER): Admitting: Vascular Surgery

## 2024-02-05 VITALS — BP 129/67 | HR 65 | Resp 18 | Ht 68.0 in | Wt 180.0 lb

## 2024-02-05 DIAGNOSIS — Z23 Encounter for immunization: Secondary | ICD-10-CM | POA: Insufficient documentation

## 2024-02-05 DIAGNOSIS — I959 Hypotension, unspecified: Secondary | ICD-10-CM | POA: Insufficient documentation

## 2024-02-05 DIAGNOSIS — E782 Mixed hyperlipidemia: Secondary | ICD-10-CM

## 2024-02-05 DIAGNOSIS — Z9889 Other specified postprocedural states: Secondary | ICD-10-CM

## 2024-02-05 DIAGNOSIS — E1142 Type 2 diabetes mellitus with diabetic polyneuropathy: Secondary | ICD-10-CM

## 2024-02-05 DIAGNOSIS — E118 Type 2 diabetes mellitus with unspecified complications: Secondary | ICD-10-CM | POA: Insufficient documentation

## 2024-02-05 DIAGNOSIS — I739 Peripheral vascular disease, unspecified: Secondary | ICD-10-CM

## 2024-02-05 DIAGNOSIS — I10 Essential (primary) hypertension: Secondary | ICD-10-CM

## 2024-02-05 DIAGNOSIS — T8130XA Disruption of wound, unspecified, initial encounter: Secondary | ICD-10-CM

## 2024-02-05 NOTE — Assessment & Plan Note (Signed)
 Stopped his spironolactone  yesterday.  Please drink fluids.  Discussed fluids that do not have caffeine and low sugar.  Dehydration makes you feel terrible so getting her hydration status up should help.  Will check potassium next month.

## 2024-02-05 NOTE — Assessment & Plan Note (Signed)
 He is profoundly depressed and has low energy.  Stop Lexapro  10 mg as this can lower energy.  Start Prozac 20 mg every morning and follow-up in a month.  Given consideration to geriatric psychiatrist referral.

## 2024-02-05 NOTE — Assessment & Plan Note (Signed)
 He has excellent control of his diabetes with an A1c today of 6.4%.  Denies any episodes of hypoglycemia or headache, confusion, diaphoresis, shakiness.  Stay on current regimen of insulin .

## 2024-02-05 NOTE — Assessment & Plan Note (Signed)
 Gave him a double dose of flu vaccine in the office.  He would like a COVID vaccine and have sent that to his pharmacy.

## 2024-02-05 NOTE — Progress Notes (Signed)
 Established Patient Office Visit  Subjective   Patient ID: Manuel [name], male    DOB: [birth year]  Age: 78 y.o. MRN: [id number]  Chief Complaint  Patient presents with   Hospitalization Follow-up    HPI Delightful 78 year old gentleman with CAD (s/p PCI to RCA with overlapping stents, NSTEMI 3/25, peak troponin 1045, 3 DES to LAD), ischemic cardiomyopathy (7/24 LVEF 25 to 35% 3/25 echo LVEF 25 to 35% moderate dilatation LV), PAD (2011 s/p PTA L common iliac, right peritoneal, left distal SFA, proximal popliteal, stent to right distal SFA), s/p right BKA, gout, DMT2, mixed hyperlipidemia, HTN, depression/anxiety, insomnia, lumbar DDD and uncontrolled painful diabetic peripheral neuropathy.  He fell out of bed and ruptured his right femoral artery and is s/p endovascular repair.  He had femoral artery rupture then repaired then wound dehiscence and then Serratia infection.  Infectious disease was consulted.  He has a wound vacuum now.  The wound nurse checked it yesterday at his house and stated that there may be a tunnel forming.  He has been on antibiotics for months and is now finished with them.  He has an appointment with vascular surgery tomorrow.   His A1c today is 6.4%.  He denies any episodes of hypoglycemia, diaphoresis, confusion, shakiness, headache.  He has stopped drinking ethanol.  He is sleeping better at night and takes gabapentin  100 mg. He says he wants to stay in bed and sleep all day.  He does not want to engage in life.  I am done.  I am ready to check out.  He does not want to read or watch television.  He is got a brand-new radio and he is hardly using it at all.  He does not feel anxious or nervous he is not worried about life after death.  He states I feel like crap.  He complains about being very weak.  He has physical therapy and Occupational Therapy coming out to the house. He went to the ER yesterday with a blood pressure of 60/40.  He got fluids he was found to  be hyperkalemic so they stopped his spironolactone .  Blood pressure today is 111/64. His wife Evlyn is present    Objective:     BP 111/64 (BP Location: Right Arm, Patient Position: Sitting, Cuff Size: Normal)   Pulse 72   Temp 98.2 F (36.8 C) (Oral)   Resp 18   Ht 5' 8 (1.727 m)   Wt 180 lb (81.6 kg) Comment: pt reported  SpO2 94%   BMI 27.37 kg/m    Physical Exam Vitals and nursing note reviewed.  Constitutional:      Appearance: Normal appearance.  HENT:     Head: Normocephalic and atraumatic.  Eyes:     Conjunctiva/sclera: Conjunctivae normal.  Cardiovascular:     Rate and Rhythm: Normal rate and regular rhythm.  Pulmonary:     Effort: Pulmonary effort is normal.     Breath sounds: Normal breath sounds.  Musculoskeletal:     Right lower leg: No edema.     Left lower leg: No edema.  Skin:    General: Skin is warm and dry.  Neurological:     Mental Status: He is alert and oriented to person, place, and time.  Psychiatric:        Mood and Affect: Mood normal.        Behavior: Behavior normal.        Thought Content: Thought content normal.  Judgment: Judgment normal.          Results for orders placed or performed in visit on 02/04/24  POCT glycosylated hemoglobin (Hb A1C)  Result Value Ref Range   Hemoglobin A1C 6.4 (A) 4.0 - 5.6 %   HbA1c POC (<> result, manual entry)     HbA1c, POC (prediabetic range)     HbA1c, POC (controlled diabetic range)        The ASCVD Risk score (Arnett DK, et al., 2019) failed to calculate for the following reasons:   The 2019 ASCVD risk score is only valid for ages 45 to 9   Risk score cannot be calculated because patient has a medical history suggesting prior/existing ASCVD    Assessment & Plan:  Insulin  dependent type 2 diabetes mellitus (HCC) -     POCT glycosylated hemoglobin (Hb A1C)  Immunization due Assessment & Plan: Gave him a double dose of flu vaccine in the office.  He would like a COVID  vaccine and have sent that to his pharmacy.  Orders: -     Flu vaccine HIGH DOSE PF(Fluzone Trivalent)  Primary insomnia -     Gabapentin ; Take 1 capsule (100 mg total) by mouth at bedtime.  Dispense: 90 capsule; Refill: 3  Depression, recurrent Assessment & Plan: He is profoundly depressed and has low energy.  Stop Lexapro  10 mg as this can lower energy.  Start Prozac 20 mg every morning and follow-up in a month.  Given consideration to geriatric psychiatrist referral.  Orders: -     FLUoxetine HCl; Take 1 capsule (20 mg total) by mouth daily.  Dispense: 90 capsule; Refill: 3  Controlled diabetes mellitus type 2 with complications [facility]) Assessment & Plan: He has excellent control of his diabetes with an A1c today of 6.4%.  Denies any episodes of hypoglycemia or headache, confusion, diaphoresis, shakiness.  Stay on current regimen of insulin .   Hypotension due to hypovolemia Assessment & Plan: Stopped his spironolactone  yesterday.  Please drink fluids.  Discussed fluids that do not have caffeine and low sugar.  Dehydration makes you feel terrible so getting her hydration status up should help.  Will check potassium next month.   Other orders -     COVID-19 mRNA Vac-TriS(Pfizer); Inject 0.3 mLs into the muscle once for 1 dose.  Dispense: 0.3 mL; Refill: 0     Return in about 4 weeks (around 03/03/2024).    Clytie Shetley K Regino Fournet, MD

## 2024-02-06 ENCOUNTER — Other Ambulatory Visit: Payer: Self-pay | Admitting: Family Medicine

## 2024-02-06 ENCOUNTER — Encounter (INDEPENDENT_AMBULATORY_CARE_PROVIDER_SITE_OTHER): Payer: Self-pay | Admitting: Vascular Surgery

## 2024-02-06 DIAGNOSIS — F5101 Primary insomnia: Secondary | ICD-10-CM

## 2024-02-06 MED ORDER — TRAZODONE HCL 100 MG PO TABS: 100.0000 mg | ORAL_TABLET | Freq: Every day | ORAL | 5 refills | Status: AC

## 2024-02-06 NOTE — Progress Notes (Signed)
 Subjective:    Patient ID: Manuel [name], male    DOB: 23-Dec-1945, 78 y.o.   MRN: [id number] Chief Complaint  Patient presents with   Follow-up    Follow up wound tunneling per home care nurse     Manuel [name] returns to clinic  today in follow up for evaluation of right groin wound with wound vac placement. Patient's wife is at the visit today. He has brought supplies to change out the wound vac in clinic here today. He endorses he has not had any problems with the wound vac since leaving the hospital. He endorses home health nursing is coming to his home multiple times a week to do wound vac changes. He denies any pain to his groin. Denies any erythema or swelling to the area. No signs or symptoms of infection to note. Patient endorses he is going to see Infectious Disease next week in follow up, therefore I have taken picture sof the wound bed and took a culture to double check we are not missing a brewing infection, that will be available for his follow up with infectious disease. He endorses he has now completed his course of oral antibiotics he was sent home from the hospital to take.      PROCEDURE:12/12/2023 1.   Right groin reexploration for bleeding 2.   Evacuation of right groin hematoma 3.   Suture repair of proximal bypass anastomosis in the distal external iliac artery 4.   Replacement of the sartorius flap for coverage 5.   Placement of gentamicin  and vancomycin  antibiotic impregnated beads 6.   Negative pressure dressing placement right groin  PROCEDURE:12/11/2023 1.   Right external iliac artery to profunda femoris artery bypass with autologous ipsilateral superficial femoral artery 2.   Resection of infected right common femoral artery and removal of common femoral artery, profunda femoris artery, and superficial femoral artery stents 3.   Harvest of right superficial femoral artery for bypass with endarterectomy of right superficial femoral artery to make it usable  for bypass conduit 4.   Right sartorius flap placement 5.   Negative pressure dressing placement right groin 6.   Redo groin exploration for vascular surgery     Review of Systems  Constitutional: Negative.   Skin:  Positive for wound.  All other systems reviewed and are negative.      Objective:   Physical Exam Constitutional:      Appearance: Normal appearance. He is normal weight.  HENT:     Head: Normocephalic.  Eyes:     Pupils: Pupils are equal, round, and reactive to light.  Cardiovascular:     Rate and Rhythm: Normal rate and regular rhythm.     Pulses: Normal pulses.     Heart sounds: Normal heart sounds.  Pulmonary:     Effort: Pulmonary effort is normal.     Breath sounds: Normal breath sounds.  Abdominal:     General: Abdomen is flat. Bowel sounds are normal.     Palpations: Abdomen is soft.  Musculoskeletal:        General: Tenderness and deformity present. Normal range of motion.     Comments: HX of Right Below the knee amputation.   Positive tenderness to his right groin wound dehiscence   Skin:    General: Skin is warm and dry.     Capillary Refill: Capillary refill takes 2 to 3 seconds. Left lower extremity only Neurological:     General: No focal deficit present.  Mental Status: He is alert and oriented to person, place, and time. Mental status is at baseline.  Psychiatric:        Mood and Affect: Mood normal.        Thought Content: Thought content normal.        Judgment: Judgment normal.     BP 129/67 (BP Location: Left Arm, Patient Position: Sitting, Cuff Size: Normal)   Pulse 65   Resp 18   Ht 5' 8 (1.727 m)   Wt 180 lb (81.6 kg)   BMI 27.37 kg/m   Past Medical History:  Diagnosis Date   Acute ST elevation myocardial infarction (STEMI) of inferior wall (HCC) 04/19/2010   a.) transfered from [facility] to [facility] --> LHC/PCI (very difficult procedure) --> 3.0 x 23 mm and 3.0 x 12 mm Xience stents to RCA   Allergies    Arthritis     Benign essential hypertension    Bilateral carotid artery disease 05/08/2021   a.) carotid doppler 05/08/2021: 1-39% BICA   CAD (coronary artery disease) 04/19/2010   a.) inferior STEMI 04/19/2010 --> LHC/PCI: 50-70% pD1, 80% pRI, 90/90/90% RCA (overlapping 3.0 x 23 and 3.0 x 12 mm Xience DES); b.) MV 11/10/2018: fixed minimally reversible inferior perfusion defect   Cellulitis of foot    Chronic HFrEF (heart failure with reduced ejection fraction) (HCC)    DDD (degenerative disc disease), cervical    Diabetes mellitus type 2, insulin  dependent (HCC)    Diverticulosis    Full dentures    Gout    Hard of hearing    History of bilateral cataract extraction 2022   History of ETOH abuse    Hyperlipidemia    Ischemic cardiomyopathy 04/19/2010   a.) TTE 04/19/2010: 40%; b.) TTE 04/20/2014: EF >55%; c.) TTE 11/10/2018: EF 45%; d.) TTE 11/14/2022: EF 25-30%; e. 06/2023 Echo: EF 25-30%, nl RV size/fxn. Mild MR. Mild-mod TR. Ao sclerosis w/o stenosis.   Long term current use of aspirin     Long term current use of clopidogrel     Lumbar degenerative disc disease    Lumbar radiculopathy    Lumbar vertebral fracture (chronic superior endplate of L1)    NSTEMI (non-ST elevated myocardial infarction) (HCC) 01/15/2023   OSA (obstructive sleep apnea)    a.) unable to tolerate nocturnal PAP therapy   Peripheral artery disease    a.) stenting 05/14/21: 12 mm x 12 cm LifeStent RIGHT dis SFA/prox pop; b.) s/p cath directed thrombolysis RIGHT SFA/pop 06/14/21; c.) s/p mech thrombectomy + stenting 06/15/21: 8 mm x 25cm & 8 mm x 7.5cm Viabahn; d.) s/p BILAT CFA, profunda femoris, SFA endarterectomies + fogarty embolectomy + stenting 11/15/22: 12mm x 58mm Lifestream BILAT CIAs, 14 mm x 6 cm Lifestream & 13 mm x 5 cm Viabahn LEFT EIA   Peripheral neuropathy    Umbilical hernia     Social History   Socioeconomic History   Marital status: Married    Spouse name: [name]   Number of children: 3   Years of  education: Not on file   Highest education level: Not on file  Occupational History   Not on file  Tobacco Use   Smoking status: Never    Passive exposure: Never   Smokeless tobacco: Never  Vaping Use   Vaping status: Never Used  Substance and Sexual Activity   Alcohol  use: Yes    Alcohol /week: 21.0 standard drinks of alcohol     Types: 7 Glasses of wine, 14 Cans of beer per week  Comment: occassional   Drug use: No   Sexual activity: Not on file  Other Topics Concern   Not on file  Social History Narrative   Not on file   Social Drivers of Health   Financial Resource Strain: Low Risk  (07/11/2023)   Overall Financial Resource Strain (CARDIA)    Difficulty of Paying Living Expenses: Not hard at all  Food Insecurity: No Food Insecurity (01/17/2024)   Hunger Vital Sign    Worried About Running Out of Food in the Last Year: Never true    Ran Out of Food in the Last Year: Never true  Transportation Needs: No Transportation Needs (01/17/2024)   PRAPARE - Administrator, Civil Service (Medical): No    Lack of Transportation (Non-Medical): No  Physical Activity: Not on file  Stress: Not on file  Social Connections: Moderately Isolated (01/17/2024)   Social Connection and Isolation Panel    Frequency of Communication with Friends and Family: More than three times a week    Frequency of Social Gatherings with Friends and Family: More than three times a week    Attends Religious Services: Patient declined    Active Member of Clubs or Organizations: No    Attends Banker Meetings: Patient declined    Marital Status: Married  Catering manager Violence: Not At Risk (01/17/2024)   Humiliation, Afraid, Rape, and Kick questionnaire    Fear of Current or Ex-Partner: No    Emotionally Abused: No    Physically Abused: No    Sexually Abused: No    Past Surgical History:  Procedure Laterality Date   AMPUTATION Right 11/18/2022   Procedure: AMPUTATION 4TH AND 5TH  RAY;  Surgeon: [name], [name];  Location: [facility];  Service: Orthopedics/Podiatry;  Laterality: Right;  4th and 5th toe   AMPUTATION Right 04/20/2023   Procedure: AMPUTATION BELOW KNEE;  Surgeon: [name], MD;  Location: [facility];  Service: Vascular;  Laterality: Right;  block then general   APPLICATION OF WOUND VAC Right 01/09/2023   Procedure: APPLICATION OF WOUND VAC;  Surgeon: [name], MD;  Location: [facility];  Service: Vascular;  Laterality: Right;   APPLICATION OF WOUND VAC Right 12/12/2023   Procedure: APPLICATION, WOUND VAC;  Surgeon: [name], MD;  Location: [facility];  Service: Vascular;  Laterality: Right;   APPLICATION OF WOUND VAC Right 12/17/2023   Procedure: APPLICATION, WOUND VAC;  Surgeon: [name], MD;  Location: [facility];  Service: Vascular;  Laterality: Right;  EXCHANGE   APPLICATION OF WOUND VAC Right 12/23/2023   Procedure: APPLICATION, WOUND VAC;  Surgeon: [name], MD;  Location: [facility];  Service: General;  Laterality: Right;   APPLICATION OF WOUND VAC Right 12/11/2023   Procedure: APPLICATION, WOUND VAC;  Surgeon: [name], MD;  Location: [facility];  Service: Vascular;  Laterality: Right;   CATARACT EXTRACTION W/PHACO Right 03/14/2021   Procedure: CATARACT EXTRACTION PHACO AND INTRAOCULAR LENS PLACEMENT (IOC) RIGHT DIABETIC;  Surgeon: [name], MD;  Location: [facility];  Service: Ophthalmology;  Laterality: Right;  Diabetic 16.78 01:39.9   CATARACT EXTRACTION W/PHACO Left 03/28/2021   Procedure: CATARACT EXTRACTION PHACO AND INTRAOCULAR LENS PLACEMENT (IOC) LEFT DIABETIC 6.93 01:22.0;  Surgeon: [name], MD;  Location: [facility];  Service: Ophthalmology;  Laterality: Left;  Diabetic   COLONOSCOPY     CORONARY ANGIOPLASTY WITH STENT PLACEMENT  03/2010   Procedure: CORONARY ANGIOPLASTY WITH STENT PLACEMENT; Location: [name]  CORONARY STENT INTERVENTION N/A 07/14/2023   Procedure: CORONARY  STENT INTERVENTION;  Surgeon: [name], MD;  Location: [facility];  Service: Cardiovascular;  Laterality: N/A;   ENDARTERECTOMY FEMORAL Bilateral 11/15/2022   Procedure: BILATERAL COMMON FEMORAL PROFUNDA FEMORIS AND SUPERFICIAL FEMORAL ARTERY ENDARTECTOMIES, RIGHT FOGARTY EMBOLECTOMY OF THE RIGHT SFA  AND  POPLITEAL ARTERIES. EZELLA AND RIGHT LOWER EXTREMITY ANGIOGRAM.;  Surgeon: [name], MD;  Location: [facility];  Service: Vascular;  Laterality: Bilateral;   ENDARTERECTOMY FEMORAL Right 12/11/2023   Procedure: ENDARTERECTOMY, FEMORAL;  Surgeon: [name], MD;  Location: [facility];  Service: Vascular;  Laterality: Right;  Re-do of femoral endarterectomy with sartorius flap   FEMORAL ARTERY EXPLORATION Right 12/12/2023   Procedure: EXPLORATION, ARTERY, FEMORAL- stop bleeding with suture repair anastomaosis , abx beads;  Surgeon: [name], MD;  Location: [facility];  Service: Vascular;  Laterality: Right;   GROIN DEBRIDEMENT Right 12/28/2023   Procedure: DEBRIDEMENT, INGUINAL REGION;  Surgeon: Elnor Norleen CROME, MD;  Location: [facility];  Service: Vascular;  Laterality: Right;   HEMATOMA EVACUATION N/A 12/12/2023   Procedure: EVACUATION HEMATOMA;  Surgeon: [name], MD;  Location: [facility];  Service: Vascular;  Laterality: N/A;   INSERTION OF ILIAC STENT Bilateral 11/15/2022   Procedure: BILATERAL STENT INSERTION IN BILATERAL  COMMON ILIAC ARTERY, STENT INSERTION OF LEFT EXTERNAL ILIAC ARTERY. ANGIOPLASTY RIGHT TIBIAL  AND POPLITEAL ARTERY.;  Surgeon: [name], MD;  Location: [facility];  Service: Vascular;  Laterality: Bilateral;   IRRIGATION AND DEBRIDEMENT KNEE Right 04/21/2023   Procedure: IRRIGATION AND DEBRIDEMENT KNEE;  Surgeon: Zafonte, Brian Karriem, MD;  Location: [facility];  Service: Orthopedics;  Laterality: Right;   LOWER EXTREMITY ANGIOGRAPHY Right 05/14/2021   Procedure: LOWER EXTREMITY ANGIOGRAPHY;  Surgeon: [name], MD;  Location: [facility];  Service: Cardiovascular;  Laterality: Right;   LOWER EXTREMITY ANGIOGRAPHY Right 06/14/2021   Procedure: Lower Extremity Angiography;  Surgeon: [name], MD;  Location: [facility];  Service: Cardiovascular;  Laterality: Right;   LOWER EXTREMITY ANGIOGRAPHY Right 11/11/2022   Procedure: Lower Extremity Angiography;  Surgeon: [name], MD;  Location: [facility];  Service: Cardiovascular;  Laterality: Right;   LOWER EXTREMITY ANGIOGRAPHY Right 12/04/2023   Procedure: Lower Extremity Angiography;  Surgeon: [name], MD;  Location: [facility];  Service: Cardiovascular;  Laterality: Right;   LOWER EXTREMITY INTERVENTION Right 06/15/2021   Procedure: LOWER EXTREMITY INTERVENTION;  Surgeon: [name], MD;  Location: [facility];  Service: Cardiovascular;  Laterality: Right;   RIGHT/LEFT HEART CATH AND CORONARY ANGIOGRAPHY N/A 07/14/2023   Procedure: RIGHT/LEFT HEART CATH AND CORONARY ANGIOGRAPHY;  Surgeon: [name], MD;  Location: [facility];  Service: Cardiovascular;  Laterality: N/A;   TONSILLECTOMY     TRANSMETATARSAL AMPUTATION Right 02/17/2023   Procedure: TRANSMETATARSAL AMPUTATION;  Surgeon: Lennie Barter, [name];  Location: [facility];  Service: Orthopedics/Podiatry;  Laterality: Right;   WOUND DEBRIDEMENT Right 01/09/2023   Procedure: DEBRIDEMENT WOUND;  Surgeon: [name], MD;  Location: [facility];  Service: Vascular;  Laterality: Right;    Family History  Problem Relation Age of Onset   Scoliosis Mother    Heart disease Father     No Known Allergies     Latest Ref Rng & Units 02/03/2024    2:55 PM 01/22/2024    2:30 AM 01/21/2024    3:33 AM  CBC  WBC 4.0 - 10.5 K/uL 9.1  7.0  7.6   Hemoglobin 13.0 - 17.0 g/dL 87.1  87.1  88.8   Hematocrit 39.0 - 52.0 % 38.8  38.9  33.1   Platelets 150 - 400 K/uL 251  264  228       CMP     Component Value Date/Time   NA 134 (L) 02/03/2024 1455   NA 137  07/21/2023 1246   NA 137 02/16/2012 2013   K 5.5 (H) 02/03/2024 1455   K 3.8 02/16/2012 2013   CL 103 02/03/2024 1455   CL 102 02/16/2012 2013   CO2 22 02/03/2024 1455   CO2 25 02/16/2012 2013   GLUCOSE 125 (H) 02/03/2024 1455   GLUCOSE 154 (H) 02/16/2012 2013   BUN 44 (H) 02/03/2024 1455   BUN 30 (H) 07/21/2023 1246   BUN 10 02/16/2012 2013   CREATININE 0.97 02/03/2024 1455   CREATININE 0.72 02/16/2012 2013   CALCIUM  9.5 02/03/2024 1455   CALCIUM  9.6 02/16/2012 2013   PROT 5.7 (L) 12/31/2023 1327   PROT 7.5 02/16/2012 2013   ALBUMIN 2.5 (L) 12/31/2023 1327   ALBUMIN 4.4 02/16/2012 2013   AST 17 12/31/2023 1327   AST 52 (H) 02/16/2012 2013   ALT 9 12/31/2023 1327   ALT 101 (H) 02/16/2012 2013   ALKPHOS 90 12/31/2023 1327   ALKPHOS 77 02/16/2012 2013   BILITOT 0.7 12/31/2023 1327   BILITOT 0.6 02/16/2012 2013   EGFR 70 07/21/2023 1246   GFRNONAA >60 02/03/2024 1455   GFRNONAA >60 02/16/2012 2013     No results found.     Assessment & Plan:   1. Peripheral arterial disease with history of revascularization (Primary) Patient now postop right femoral endarterectomy with reexploration due to hematoma.  See above for the noted procedures.  Patient continues to have delayed wound healing with the use of wound VAC.  No complications to his vasculature noted.  Extremity remains warm to touch with positive palpable popliteal pulse.  History of BKA so no DP/PT pulses available.  Wound bed is healing well but slowly.  Upper proximal portion of the wound appears to have a small tunnel.  I cultured this today to double check the patient does not have a deep seeded infection.  He endorses he has completed his oral antibiotics at home last week.  I took pictures of the wound placed in his media files as well as completing a culture of the wound for the patient's upcoming visit with infectious disease next week.  Patient will follow-up with vascular surgery again in 1 month after seeing  infectious disease for another wound evaluation.  2. Wound dehiscence Patient with right groin post operative wound dehiscence. Patient has wound vac to his groin. This was changed today in clinic. Wound Bed is beefy red. Wound measures 10 cm long X 2 cm wide and 1 cm deep with a tunneling in the upper proximal portion of the groin. Wound continues to heal slowly. Patient will require another 4 weeks of wound vac therapy. Will see patient back in clinic for another wound check in 1 month.   3. Essential hypertension Continue antihypertensive medications as already ordered, these medications have been reviewed and there are no changes at this time.  4. Diabetic polyneuropathy associated with type 2 diabetes mellitus (HCC) Continue hypoglycemic medications as already ordered, these medications have been reviewed and there are no changes at this time.  Hgb A1C to be monitored as already arranged  by primary service  5. Mixed hyperlipidemia Continue statin as ordered and reviewed, no changes at this time   Current Outpatient Medications on File Prior to Visit  Medication Sig Dispense Refill   artificial tears ophthalmic solution Place 1 drop into both eyes as needed for dry eyes. 15 mL 0   aspirin  EC 81 MG tablet Take 1 tablet (81 mg total) by mouth daily. 150 tablet 2   atorvastatin  (LIPITOR ) 80 MG tablet Take 1 tablet (80 mg total) by mouth at bedtime. 90 tablet 3   carvedilol  (COREG ) 3.125 MG tablet Take 1 tablet (3.125 mg total) by mouth 2 (two) times daily with a meal. 180 tablet 3   clopidogrel  (PLAVIX ) 75 MG tablet Take 1 tablet (75 mg total) by mouth daily. 90 tablet 3   dapagliflozin  propanediol (FARXIGA ) 10 MG TABS tablet Take 1 tablet (10 mg total) by mouth daily. 90 tablet 3   ferrous sulfate  325 (65 FE) MG EC tablet Take 325 mg by mouth daily with breakfast.     FLUoxetine (PROZAC) 20 MG capsule Take 1 capsule (20 mg total) by mouth daily. 90 capsule 3   fluticasone  (FLONASE ) 50  MCG/ACT nasal spray Place 1 spray into both nostrils 2 (two) times daily as needed for allergies or rhinitis.     folic acid  (FOLVITE ) 1 MG tablet Take 1 tablet (1 mg total) by mouth daily. 90 tablet 1   gabapentin  (NEURONTIN ) 100 MG capsule Take 1 capsule (100 mg total) by mouth at bedtime. 90 capsule 3   insulin  aspart protamine  - aspart (NOVOLOG  70/30 MIX) (70-30) 100 UNIT/ML FlexPen Inject 3 Units into the skin 2 (two) times daily with a meal.     loratadine  (CLARITIN ) 10 MG tablet Take 1 tablet (10 mg total) by mouth daily as needed for allergies.     nitroGLYCERIN  (NITROSTAT ) 0.4 MG SL tablet Place 1 tablet (0.4 mg total) under the tongue every 5 (five) minutes as needed for chest pain. Max: 3 tablets per day     pantoprazole  (PROTONIX ) 40 MG tablet Take 1 tablet (40 mg total) by mouth daily. 90 tablet 2   polyethylene glycol (MIRALAX  / GLYCOLAX ) 17 g packet Take 17 g by mouth daily.     sacubitril -valsartan  (ENTRESTO ) 24-26 MG Take 1 tablet by mouth every 12 (twelve) hours. 180 tablet 3   thiamine  (VITAMIN B-1) 100 MG tablet Take 1 tablet (100 mg total) by mouth daily. 90 tablet 0   traZODone  (DESYREL ) 100 MG tablet Take 1 tablet (100 mg total) by mouth at bedtime.     escitalopram  (LEXAPRO ) 10 MG tablet Take 1 tablet (10 mg total) by mouth daily.     spironolactone  (ALDACTONE ) 25 MG tablet Take 1 tablet (25 mg total) by mouth daily. 90 tablet 3   No current facility-administered medications on file prior to visit.    There are no Patient Instructions on file for this visit. No follow-ups on file.   [name], NP

## 2024-02-10 ENCOUNTER — Encounter: Payer: Self-pay | Admitting: Infectious Diseases

## 2024-02-10 ENCOUNTER — Ambulatory Visit: Attending: Infectious Diseases | Admitting: Infectious Diseases

## 2024-02-10 VITALS — BP 105/41 | HR 68 | Temp 98.0°F

## 2024-02-10 DIAGNOSIS — T827XXD Infection and inflammatory reaction due to other cardiac and vascular devices, implants and grafts, subsequent encounter: Secondary | ICD-10-CM

## 2024-02-10 DIAGNOSIS — Z7902 Long term (current) use of antithrombotics/antiplatelets: Secondary | ICD-10-CM | POA: Diagnosis not present

## 2024-02-10 DIAGNOSIS — A498 Other bacterial infections of unspecified site: Secondary | ICD-10-CM

## 2024-02-10 DIAGNOSIS — B3789 Other sites of candidiasis: Secondary | ICD-10-CM | POA: Insufficient documentation

## 2024-02-10 DIAGNOSIS — Z7984 Long term (current) use of oral hypoglycemic drugs: Secondary | ICD-10-CM | POA: Insufficient documentation

## 2024-02-10 DIAGNOSIS — T8743 Infection of amputation stump, right lower extremity: Secondary | ICD-10-CM | POA: Diagnosis present

## 2024-02-10 DIAGNOSIS — T827XXA Infection and inflammatory reaction due to other cardiac and vascular devices, implants and grafts, initial encounter: Secondary | ICD-10-CM | POA: Diagnosis not present

## 2024-02-10 DIAGNOSIS — R7881 Bacteremia: Secondary | ICD-10-CM

## 2024-02-10 DIAGNOSIS — Z89511 Acquired absence of right leg below knee: Secondary | ICD-10-CM | POA: Insufficient documentation

## 2024-02-10 DIAGNOSIS — B9689 Other specified bacterial agents as the cause of diseases classified elsewhere: Secondary | ICD-10-CM | POA: Insufficient documentation

## 2024-02-10 NOTE — Progress Notes (Signed)
 NAME: Manuel [name]  DOB: [birth year]  MRN: [id number]  Date/Time: 02/10/2024 9:19 AM  REQUESTING PROVIDER Subjective:   ? 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 For ruptured right femoral artery leaking into the subcutaneous tissue forming a hematoma.  He was taken for surgery on 12/11/2023 by vascular surgeon and underwent excision of the old graft and portion of the femoral artery and had bypass graft with autoLog us  external femoral artery.  Multiple cultures were taken and they all grew Serratia.  Also he had Serratia bacteremia.  He received 6 weeks of IV ertapenem  until 01/22/2024 and then he got a week of p.o. Bactrim . He also had Candida infection around the area and received fluconazole for 7 days.  Now he is here for a follow-up He still has a wound VAC but that has been removed by his nurse as he has some irritation around the skin and so he is got a few days off the wound VAC He saw vascular on 02/05/2024 and they seem to be happy with his progress.  His next appointment is in 1 month Patient says he is doing better does not have any pain in the femoral area like he used to have before His BP has been running low and he recently had been to the ED for low BP and saw his PCP after that.  One of his diuretics has been discontinued.  d  Past Medical History:  Diagnosis Date   Acute ST elevation myocardial infarction (STEMI) of inferior wall (HCC) 04/19/2010   a.) transfered from [facility] to [facility] --> LHC/PCI (very difficult procedure) --> 3.0 x 23 mm and 3.0 x 12 mm Xience stents to RCA   Allergies    Arthritis    Benign essential hypertension    Bilateral carotid artery disease 05/08/2021   a.) carotid  doppler 05/08/2021: 1-39% BICA   CAD (coronary artery disease) 04/19/2010   a.) inferior STEMI 04/19/2010 --> LHC/PCI: 50-70% pD1, 80% pRI, 90/90/90% RCA (overlapping 3.0 x 23 and 3.0 x 12 mm Xience DES); b.) MV 11/10/2018: fixed minimally reversible inferior perfusion defect   Cellulitis of foot    Chronic HFrEF (heart failure with reduced ejection fraction) (HCC)    DDD (degenerative disc disease), cervical    Diabetes mellitus type 2, insulin  dependent (HCC)    Diverticulosis    Full dentures    Gout    Hard of hearing    History of bilateral cataract extraction 2022   History of ETOH abuse    Hyperlipidemia    Ischemic cardiomyopathy 04/19/2010   a.) TTE 04/19/2010: 40%; b.) TTE 04/20/2014: EF >55%; c.) TTE 11/10/2018: EF 45%; d.) TTE 11/14/2022: EF 25-30%; e. 06/2023 Echo: EF 25-30%, nl RV size/fxn. Mild MR. Mild-mod TR. Ao sclerosis w/o stenosis.   Long term current use of aspirin     Long term current use of clopidogrel     Lumbar degenerative disc disease    Lumbar radiculopathy    Lumbar vertebral fracture (chronic superior endplate of L1)    NSTEMI (non-ST elevated myocardial infarction) (HCC) 01/15/2023   OSA (obstructive sleep apnea)    a.) unable to tolerate nocturnal PAP therapy   Peripheral artery disease    a.) stenting 05/14/21: 12 mm x 12 cm LifeStent RIGHT dis  SFA/prox pop; b.) s/p cath directed thrombolysis RIGHT SFA/pop 06/14/21; c.) s/p mech thrombectomy + stenting 06/15/21: 8 mm x 25cm & 8 mm x 7.5cm Viabahn; d.) s/p BILAT CFA, profunda femoris, SFA endarterectomies + fogarty embolectomy + stenting 11/15/22: 12mm x 58mm Lifestream BILAT CIAs, 14 mm x 6 cm Lifestream & 13 mm x 5 cm Viabahn LEFT EIA   Peripheral neuropathy    Umbilical hernia     Past Surgical History:  Procedure Laterality Date   AMPUTATION Right 11/18/2022   Procedure: AMPUTATION 4TH AND 5TH RAY;  Surgeon: [name], [name];  Location: [facility];  Service: Orthopedics/Podiatry;  Laterality: Right;  4th  and 5th toe   AMPUTATION Right 04/20/2023   Procedure: AMPUTATION BELOW KNEE;  Surgeon: [name], MD;  Location: [facility];  Service: Vascular;  Laterality: Right;  block then general   APPLICATION OF WOUND VAC Right 01/09/2023   Procedure: APPLICATION OF WOUND VAC;  Surgeon: [name], MD;  Location: [facility];  Service: Vascular;  Laterality: Right;   APPLICATION OF WOUND VAC Right 12/12/2023   Procedure: APPLICATION, WOUND VAC;  Surgeon: [name], MD;  Location: [facility];  Service: Vascular;  Laterality: Right;   APPLICATION OF WOUND VAC Right 12/17/2023   Procedure: APPLICATION, WOUND VAC;  Surgeon: [name], MD;  Location: [facility];  Service: Vascular;  Laterality: Right;  EXCHANGE   APPLICATION OF WOUND VAC Right 12/23/2023   Procedure: APPLICATION, WOUND VAC;  Surgeon: [name], MD;  Location: [facility];  Service: General;  Laterality: Right;   APPLICATION OF WOUND VAC Right 12/11/2023   Procedure: APPLICATION, WOUND VAC;  Surgeon: [name], MD;  Location: [facility];  Service: Vascular;  Laterality: Right;   CATARACT EXTRACTION W/PHACO Right 03/14/2021   Procedure: CATARACT EXTRACTION PHACO AND INTRAOCULAR LENS PLACEMENT (IOC) RIGHT DIABETIC;  Surgeon: [name], MD;  Location: [facility];  Service: Ophthalmology;  Laterality: Right;  Diabetic 16.78 01:39.9   CATARACT EXTRACTION W/PHACO Left 03/28/2021   Procedure: CATARACT EXTRACTION PHACO AND INTRAOCULAR LENS PLACEMENT (IOC) LEFT DIABETIC 6.93 01:22.0;  Surgeon: [name], MD;  Location: [facility];  Service: Ophthalmology;  Laterality: Left;  Diabetic   COLONOSCOPY     CORONARY ANGIOPLASTY WITH STENT PLACEMENT  03/2010   Procedure: CORONARY ANGIOPLASTY WITH STENT PLACEMENT; Location: [name]   CORONARY STENT INTERVENTION N/A 07/14/2023   Procedure: CORONARY STENT INTERVENTION;  Surgeon: [name], MD;  Location: [facility];  Service: Cardiovascular;   Laterality: N/A;   ENDARTERECTOMY FEMORAL Bilateral 11/15/2022   Procedure: BILATERAL COMMON FEMORAL PROFUNDA FEMORIS AND SUPERFICIAL FEMORAL ARTERY ENDARTECTOMIES, RIGHT FOGARTY EMBOLECTOMY OF THE RIGHT SFA  AND  POPLITEAL ARTERIES. EZELLA AND RIGHT LOWER EXTREMITY ANGIOGRAM.;  Surgeon: [name], MD;  Location: [facility];  Service: Vascular;  Laterality: Bilateral;   ENDARTERECTOMY FEMORAL Right 12/11/2023   Procedure: ENDARTERECTOMY, FEMORAL;  Surgeon: [name], MD;  Location: [facility];  Service: Vascular;  Laterality: Right;  Re-do of femoral endarterectomy with sartorius flap   FEMORAL ARTERY EXPLORATION Right 12/12/2023   Procedure: EXPLORATION, ARTERY, FEMORAL- stop bleeding with suture repair anastomaosis , abx beads;  Surgeon: [name], MD;  Location: [facility];  Service: Vascular;  Laterality: Right;   GROIN DEBRIDEMENT Right 12/28/2023   Procedure: DEBRIDEMENT, INGUINAL REGION;  Surgeon: Elnor Norleen CROME, MD;  Location: [facility];  Service: Vascular;  Laterality: Right;   HEMATOMA EVACUATION N/A 12/12/2023   Procedure: EVACUATION  HEMATOMA;  Surgeon: [name], MD;  Location: [facility];  Service: Vascular;  Laterality: N/A;   INSERTION OF ILIAC STENT Bilateral 11/15/2022   Procedure: BILATERAL STENT INSERTION IN BILATERAL  COMMON ILIAC ARTERY, STENT INSERTION OF LEFT EXTERNAL ILIAC ARTERY. ANGIOPLASTY RIGHT TIBIAL  AND POPLITEAL ARTERY.;  Surgeon: [name], MD;  Location: [facility];  Service: Vascular;  Laterality: Bilateral;   IRRIGATION AND DEBRIDEMENT KNEE Right 04/21/2023   Procedure: IRRIGATION AND DEBRIDEMENT KNEE;  Surgeon: Zafonte, Brian Cyler, MD;  Location: [facility];  Service: Orthopedics;  Laterality: Right;   LOWER EXTREMITY ANGIOGRAPHY Right 05/14/2021   Procedure: LOWER EXTREMITY ANGIOGRAPHY;  Surgeon: [name], MD;  Location: [facility];  Service: Cardiovascular;  Laterality: Right;   LOWER EXTREMITY ANGIOGRAPHY Right 06/14/2021   Procedure:  Lower Extremity Angiography;  Surgeon: [name], MD;  Location: [facility];  Service: Cardiovascular;  Laterality: Right;   LOWER EXTREMITY ANGIOGRAPHY Right 11/11/2022   Procedure: Lower Extremity Angiography;  Surgeon: [name], MD;  Location: [facility];  Service: Cardiovascular;  Laterality: Right;   LOWER EXTREMITY ANGIOGRAPHY Right 12/04/2023   Procedure: Lower Extremity Angiography;  Surgeon: [name], MD;  Location: [facility];  Service: Cardiovascular;  Laterality: Right;   LOWER EXTREMITY INTERVENTION Right 06/15/2021   Procedure: LOWER EXTREMITY INTERVENTION;  Surgeon: [name], MD;  Location: [facility];  Service: Cardiovascular;  Laterality: Right;   RIGHT/LEFT HEART CATH AND CORONARY ANGIOGRAPHY N/A 07/14/2023   Procedure: RIGHT/LEFT HEART CATH AND CORONARY ANGIOGRAPHY;  Surgeon: [name], MD;  Location: [facility];  Service: Cardiovascular;  Laterality: N/A;   TONSILLECTOMY     TRANSMETATARSAL AMPUTATION Right 02/17/2023   Procedure: TRANSMETATARSAL AMPUTATION;  Surgeon: Lennie Barter, [name];  Location: [facility];  Service: Orthopedics/Podiatry;  Laterality: Right;   WOUND DEBRIDEMENT Right 01/09/2023   Procedure: DEBRIDEMENT WOUND;  Surgeon: [name], MD;  Location: [facility];  Service: Vascular;  Laterality: Right;    Social History   Socioeconomic History   Marital status: Married    Spouse name: [name]   Number of children: 3   Years of education: Not on file   Highest education level: Not on file  Occupational History   Not on file  Tobacco Use   Smoking status: Never    Passive exposure: Never   Smokeless tobacco: Never  Vaping Use   Vaping status: Never Used  Substance and Sexual Activity   Alcohol  use: Yes    Alcohol /week: 21.0 standard drinks of alcohol     Types: 7 Glasses of wine, 14 Cans of beer per week    Comment: occassional   Drug use: No   Sexual activity: Not on file   Other Topics Concern   Not on file  Social History Narrative   Not on file   Social Drivers of Health   Financial Resource Strain: Low Risk  (07/11/2023)   Overall Financial Resource Strain (CARDIA)    Difficulty of Paying Living Expenses: Not hard at all  Food Insecurity: No Food Insecurity (01/17/2024)   Hunger Vital Sign    Worried About Running Out of Food in the Last Year: Never true    Ran Out of Food in the Last Year: Never true  Transportation Needs: No Transportation Needs (01/17/2024)   PRAPARE - Administrator, Civil Service (Medical): No    Lack of Transportation (Non-Medical): No  Physical  Activity: Not on file  Stress: Not on file  Social Connections: Moderately Isolated (01/17/2024)   Social Connection and Isolation Panel    Frequency of Communication with Friends and Family: More than three times a week    Frequency of Social Gatherings with Friends and Family: More than three times a week    Attends Religious Services: Patient declined    Database administrator or Organizations: No    Attends Banker Meetings: Patient declined    Marital Status: Married  Catering manager Violence: Not At Risk (01/17/2024)   Humiliation, Afraid, Rape, and Kick questionnaire    Fear of Current or Ex-Partner: No    Emotionally Abused: No    Physically Abused: No    Sexually Abused: No    Family History  Problem Relation Age of Onset   Scoliosis Mother    Heart disease Father    No Known Allergies I? Current Outpatient Medications  Medication Sig Dispense Refill   artificial tears ophthalmic solution Place 1 drop into both eyes as needed for dry eyes. 15 mL 0   aspirin  EC 81 MG tablet Take 1 tablet (81 mg total) by mouth daily. 150 tablet 2   atorvastatin  (LIPITOR ) 80 MG tablet Take 1 tablet (80 mg total) by mouth at bedtime. 90 tablet 3   carvedilol  (COREG ) 3.125 MG tablet Take 1 tablet (3.125 mg total) by mouth 2 (two) times daily with a meal. 180  tablet 3   clopidogrel  (PLAVIX ) 75 MG tablet Take 1 tablet (75 mg total) by mouth daily. 90 tablet 3   dapagliflozin  propanediol (FARXIGA ) 10 MG TABS tablet Take 1 tablet (10 mg total) by mouth daily. 90 tablet 3   escitalopram  (LEXAPRO ) 10 MG tablet Take 1 tablet (10 mg total) by mouth daily.     ferrous sulfate  325 (65 FE) MG EC tablet Take 325 mg by mouth daily with breakfast.     FLUoxetine (PROZAC) 20 MG capsule Take 1 capsule (20 mg total) by mouth daily. 90 capsule 3   fluticasone  (FLONASE ) 50 MCG/ACT nasal spray Place 1 spray into both nostrils 2 (two) times daily as needed for allergies or rhinitis.     folic acid  (FOLVITE ) 1 MG tablet Take 1 tablet (1 mg total) by mouth daily. 90 tablet 1   gabapentin  (NEURONTIN ) 100 MG capsule Take 1 capsule (100 mg total) by mouth at bedtime. 90 capsule 3   insulin  aspart protamine  - aspart (NOVOLOG  70/30 MIX) (70-30) 100 UNIT/ML FlexPen Inject 3 Units into the skin 2 (two) times daily with a meal.     loratadine  (CLARITIN ) 10 MG tablet Take 1 tablet (10 mg total) by mouth daily as needed for allergies.     nitroGLYCERIN  (NITROSTAT ) 0.4 MG SL tablet Place 1 tablet (0.4 mg total) under the tongue every 5 (five) minutes as needed for chest pain. Max: 3 tablets per day     pantoprazole  (PROTONIX ) 40 MG tablet Take 1 tablet (40 mg total) by mouth daily. 90 tablet 2   polyethylene glycol (MIRALAX  / GLYCOLAX ) 17 g packet Take 17 g by mouth daily.     sacubitril -valsartan  (ENTRESTO ) 24-26 MG Take 1 tablet by mouth every 12 (twelve) hours. 180 tablet 3   spironolactone  (ALDACTONE ) 25 MG tablet Take 1 tablet (25 mg total) by mouth daily. 90 tablet 3   thiamine  (VITAMIN B-1) 100 MG tablet Take 1 tablet (100 mg total) by mouth daily. 90 tablet 0   traZODone  (DESYREL ) 100 MG  tablet Take 1 tablet (100 mg total) by mouth at bedtime. 30 tablet 5   No current facility-administered medications for this visit.     Abtx:  Anti-infectives (From admission, onward)     None       REVIEW OF SYSTEMS:  Const: negative fever, negative chills, negative weight loss Eyes: negative diplopia or visual changes, negative eye pain ENT: negative coryza, negative sore throat Resp: negative cough, hemoptysis, dyspnea Cards: negative for chest pain, palpitations, lower extremity edema GU: negative for frequency, dysuria and hematuria GI: Negative for abdominal pain, diarrhea, bleeding, constipation Skin: negative for rash and pruritus Heme: negative for easy bruising and gum/nose bleeding MS: negative for myalgias, arthralgias, back pain and muscle weakness Neurolo:negative for headaches, dizziness, vertigo, memory problems  Psych:  anxiety, depression  Endocrine: , diabetes Allergy/Immunology- negative for any medication or food allergies  Objective:  VITALS:  BP (!) 105/41   Pulse 68   Temp 98 F (36.7 C) (Temporal)   SpO2 92%    PHYSICAL EXAM:  General: Alert, cooperative, no distress, appears stated age.  Lungs: Clear to auscultation bilaterally. No Wheezing or Rhonchi. No rales. Heart: Regular rate and rhythm, no murmur, rub or gallop. Abdomen: Soft, non-tender,not distended. Bowel sounds normal. No masses Extremities: Right femoral area groin wound is covered with granulation tissue It is linear and smaller than before The upper pole of it is not as undermining as before The skin around the soft tissue around is tethered The skin around is irritated with slight maceration 02/10/2024  02/05/2024  01/26/2024  Right BKA Skin: No rashes or lesions. Or bruising Lymph: Cervical, supraclavicular normal. Neurologic: Grossly non-focal Pertinent Labs Lab Results CBC    Component Value Date/Time   WBC 9.1 02/03/2024 1455   RBC 4.50 02/03/2024 1455   HGB 12.8 (L) 02/03/2024 1455   HGB 15.7 02/16/2012 2013   HCT 38.8 (L) 02/03/2024 1455   HCT 44.4 02/16/2012 2013   PLT 251 02/03/2024 1455   PLT 176 02/16/2012 2013   MCV 86.2 02/03/2024 1455    MCV 91 02/16/2012 2013   MCH 28.4 02/03/2024 1455   MCHC 33.0 02/03/2024 1455   RDW 14.5 02/03/2024 1455   RDW 13.0 02/16/2012 2013   LYMPHSABS 0.5 (L) 12/04/2023 1141   MONOABS 0.5 12/04/2023 1141   EOSABS 0.1 12/04/2023 1141   BASOSABS 0.0 12/04/2023 1141       Latest Ref Rng & Units 02/03/2024    2:55 PM 01/24/2024    3:27 AM 01/22/2024    2:30 AM  CMP  Glucose 70 - 99 mg/dL 874   892   BUN 8 - 23 mg/dL 44   29   Creatinine 9.38 - 1.24 mg/dL 9.02  9.33  9.33   Sodium 135 - 145 mmol/L 134   136   Potassium 3.5 - 5.1 mmol/L 5.5   4.1   Chloride 98 - 111 mmol/L 103   101   CO2 22 - 32 mmol/L 22   27   Calcium  8.9 - 10.3 mg/dL 9.5   9.4     ? 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  With rupture and formation of hematoma in August 2025 On 12/11/2023 he underwent resection of the infected common femoral artery and removal of the femoral stents and removal of the infected profunda femoris artery stents and the graft.  He then had a right external iliac to profunda femoris artery bypass with with autologous  superficial femoral artery conduit The residual surgical wound is closing slowly.  There is some tethering of the skin and soft tissue underneath That wound looks healthy He has completed 6 weeks of IV or ertapenem  followed by 1 week of p.o. Bactrim . A superficial culture that was done while he was in the hospital on 10/2 had Staph epidermidis rare Enterococcus faecium and rare Pseudomonas aeruginosa which were not treated as they were thought to be contaminants.  Serratia bacteremia treated with 6 weeks of antibiotic.  Patient is doing better there is no need for any antibiotics currently topical wound care and follow-up with vascular Have given him an appointment for a month but he may cancel the appointment if the wound is closed.  f________________________________  discussed with patient and his wife in  detail Note:  This document was prepared using Dragon voice recognition software and may include unintentional dictation errors.

## 2024-02-11 ENCOUNTER — Telehealth: Payer: Self-pay | Admitting: Family Medicine

## 2024-02-11 NOTE — Telephone Encounter (Signed)
 Callhome number and mobile number.  No answer on either.  Will try again later.

## 2024-02-11 NOTE — Telephone Encounter (Signed)
 Spoke with patient and asked that he hold the Entresto .  Please call tomorrow with report of BP reading.

## 2024-02-12 ENCOUNTER — Telehealth: Payer: Self-pay | Admitting: Family Medicine

## 2024-02-12 NOTE — Telephone Encounter (Signed)
 Spoke with Bruna yesterday.  Reports that the wound nurse thinks his wound looks much better but he may need a skin graft for this to totally heal.  He has been having low blood pressure.  Ask him to stop Entresto .  We may get him back on a smaller dose in a week or so but right now we want to stop the Entresto .  He does not have a blood pressure cuff but the wound nurses that come in to check on him do have blood pressure cuffs so we can keep up with his blood pressure readings.

## 2024-02-13 ENCOUNTER — Encounter: Payer: Self-pay | Admitting: *Deleted

## 2024-02-13 LAB — AEROBIC CULTURE

## 2024-02-19 ENCOUNTER — Telehealth: Payer: Self-pay | Admitting: Family Medicine

## 2024-02-19 ENCOUNTER — Ambulatory Visit: Admitting: Family Medicine

## 2024-02-19 ENCOUNTER — Encounter: Payer: Self-pay | Admitting: Family Medicine

## 2024-02-19 ENCOUNTER — Ambulatory Visit
Admission: RE | Admit: 2024-02-19 | Discharge: 2024-02-19 | Disposition: A | Discharge status: Home / Self Care | Attending: Family Medicine | Admitting: Family Medicine

## 2024-02-19 ENCOUNTER — Ambulatory Visit
Admission: RE | Admit: 2024-02-19 | Discharge: 2024-02-19 | Disposition: A | Source: Ambulatory Visit | Attending: Family Medicine | Admitting: Family Medicine

## 2024-02-19 ENCOUNTER — Ambulatory Visit: Payer: Self-pay

## 2024-02-19 ENCOUNTER — Telehealth: Payer: Self-pay | Admitting: Family

## 2024-02-19 VITALS — BP 134/82 | HR 69 | Ht 68.0 in | Wt 195.0 lb

## 2024-02-19 DIAGNOSIS — R0601 Orthopnea: Secondary | ICD-10-CM

## 2024-02-19 DIAGNOSIS — F5101 Primary insomnia: Secondary | ICD-10-CM | POA: Diagnosis not present

## 2024-02-19 MED ORDER — FUROSEMIDE 20 MG PO TABS: 20.0000 mg | ORAL_TABLET | Freq: Every day | ORAL | 0 refills | Status: DC

## 2024-02-19 MED ORDER — POTASSIUM CHLORIDE CRYS ER 10 MEQ PO TBCR: 10.0000 meq | EXTENDED_RELEASE_TABLET | Freq: Two times a day (BID) | ORAL | 0 refills | Status: DC

## 2024-02-19 NOTE — Progress Notes (Signed)
   Acute Office Visit  Subjective:     Patient ID: Manuel [name], male    DOB: Dec 26, 1945, 78 y.o.   MRN: [id number]  Chief Complaint  Patient presents with   Insomnia    3 - 4 nights has not been able to sleep, last night had an episode of SOB as he was drifting to sleep, has happened before     HPI Patient is in today for ***  ROS      Objective:    BP 134/82   Pulse 69   Ht 5' 8 (1.727 m)   Wt 195 lb (88.5 kg)   SpO2 94%   BMI 29.65 kg/m  {Vitals History (Optional):23777}  Physical Exam  No results found for any visits on 02/19/24.      Assessment & Plan:   Problem List Items Addressed This Visit   None Visit Diagnoses       Orthopnea    -  Primary   Relevant Medications   potassium chloride  (KLOR-CON  M) 10 MEQ tablet   furosemide  (LASIX ) 20 MG tablet   Other Relevant Orders   CBC with Differential   Comprehensive Metabolic Panel (CMET)   DG Chest 2 View       Meds ordered this encounter  Medications   potassium chloride  (KLOR-CON  M) 10 MEQ tablet    Sig: Take 1 tablet (10 mEq total) by mouth 2 (two) times daily for 7 days.    Dispense:  14 tablet    Refill:  0   furosemide  (LASIX ) 20 MG tablet    Sig: Take 1 tablet (20 mg total) by mouth daily.    Dispense:  7 tablet    Refill:  0    No follow-ups on file.  Vinary K Rosielee Corporan, MD

## 2024-02-19 NOTE — Telephone Encounter (Signed)
 Called patient on the phone.  Not too good.  In the middle of the night last night gasping for air.  Having difficulty breathing now.  As I go to sleep I wake up gasping for air.  He has an appointment [facility]. Today.  BP seems OK.  Still a little on the low side but not ridiculously low.

## 2024-02-19 NOTE — Telephone Encounter (Signed)
 FYI Only or Action Required?: FYI only for provider: appointment scheduled on 02/19/24.  Patient was last seen in primary care on 02/04/2024 by [name], Devere POUR, MD.  Called Nurse Triage reporting Breathing Problem.  Symptoms began yesterday.  Interventions attempted: Nothing.  Symptoms are: unchanged.  Triage Disposition: See PCP When Office is Open (Within 3 Days)  Patient/caregiver understands and will follow disposition?: Yes   Copied from CRM #[id number]. Topic: Clinical - Red Word Triage >> Feb 19, 2024 11:00 AM Dedra NOVAK wrote: [facility] that prompted transfer to Nurse Triage: Pt wife, Evlyn, said pt feels like he isn't breathing well. Previously was on oxygen but isn't on it right now. Pt also has R shoulder pain from pulled muscle. Warm transfer to NT. Reason for Disposition  [1] MODERATE longstanding difficulty breathing (e.g., speaks in phrases, SOB even at rest, pulse 100-120) AND [2] SAME as normal  Answer Assessment - Initial Assessment Questions No available appt today, scheduled appt with alternative provider, 02/19/24.  Pt's wife requests prescription for oxygen.  Advised UC/ED if symptoms worsen.  1. RESPIRATORY STATUS: Describe your breathing? (e.g., wheezing, shortness of breath, unable to speak, severe coughing)      Haven't slept in several days, last night grasping for breath, SOB intermittently and this morning, laying down makes it worse. 2. ONSET: When did this breathing problem begin?      Last night 3. PATTERN Does the difficult breathing come and go, or has it been constant since it started?      Comes and goes; last episode this morning 4. SEVERITY: How bad is your breathing? (e.g., mild, moderate, severe)      Moderate now, but severe if laying down 5. RECURRENT SYMPTOM: Have you had difficulty breathing before? If Yes, ask: When was the last time? and What happened that time?      previously 6. CARDIAC HISTORY: Do you have any history  of heart disease? (e.g., heart attack, angina, bypass surgery, angioplasty)      Heart attack; 1 year ago 7. LUNG HISTORY: Do you have any history of lung disease?  (e.g., pulmonary embolus, asthma, emphysema)     Previously used oxygen, denies HX of lung disorders 8. CAUSE: What do you think is causing the breathing problem?      unsure 9. OTHER SYMPTOMS: Do you have any other symptoms? (e.g., chest pain, cough, dizziness, fever, runny nose)     Denies runny nose chronic, chest pain, cough, dizziness/ faint,   10. O2 SATURATION MONITOR:  Do you use an oxygen saturation monitor (pulse oximeter) at home? If Yes, ask: What is your reading (oxygen level) today? What is your usual oxygen saturation reading? (e.g., 95%)       no 12. TRAVEL: Have you traveled out of the country in the last month? (e.g., travel history, exposures)       no  Protocols used: Breathing Difficulty-A-AH

## 2024-02-19 NOTE — Telephone Encounter (Signed)
 Called [facility] to r/s appt

## 2024-02-19 NOTE — Telephone Encounter (Signed)
 Called cell number and left a message that I am trying to speak to patient to see how he is feeling.  Called land line and it was busy.

## 2024-02-20 ENCOUNTER — Ambulatory Visit: Payer: Self-pay

## 2024-02-20 ENCOUNTER — Ambulatory Visit: Payer: Self-pay | Admitting: Family Medicine

## 2024-02-20 LAB — COMPREHENSIVE METABOLIC PANEL WITH GFR
ALT: 19 IU/L (ref 0–44)
AST: 20 IU/L (ref 0–40)
Albumin: 4.2 g/dL (ref 3.8–4.8)
Alkaline Phosphatase: 139 IU/L — ABNORMAL HIGH (ref 47–123)
BUN/Creatinine Ratio: 24 (ref 10–24)
BUN: 21 mg/dL (ref 8–27)
Bilirubin Total: 0.6 mg/dL (ref 0.0–1.2)
CO2: 18 mmol/L — ABNORMAL LOW (ref 20–29)
Calcium: 9.5 mg/dL (ref 8.6–10.2)
Chloride: 97 mmol/L (ref 96–106)
Creatinine, Ser: 0.87 mg/dL (ref 0.76–1.27)
Globulin, Total: 3 g/dL (ref 1.5–4.5)
Glucose: 192 mg/dL — ABNORMAL HIGH (ref 70–99)
Potassium: 5.2 mmol/L (ref 3.5–5.2)
Sodium: 131 mmol/L — ABNORMAL LOW (ref 134–144)
Total Protein: 7.2 g/dL (ref 6.0–8.5)
eGFR: 87 mL/min/1.73 (ref >59)

## 2024-02-20 LAB — CBC WITH DIFFERENTIAL/PLATELET
Basophils Absolute: 0 x10E3/uL (ref 0.0–0.2)
Basos: 1 %
EOS (ABSOLUTE): 0.3 x10E3/uL (ref 0.0–0.4)
Eos: 4 %
Hematocrit: 35.7 % — ABNORMAL LOW (ref 37.5–51.0)
Hemoglobin: 11.5 g/dL — ABNORMAL LOW (ref 13.0–17.7)
Immature Grans (Abs): 0 x10E3/uL (ref 0.0–0.1)
Immature Granulocytes: 0 %
Lymphocytes Absolute: 1.9 x10E3/uL (ref 0.7–3.1)
Lymphs: 26 %
MCH: 28.8 pg (ref 26.6–33.0)
MCHC: 32.2 g/dL (ref 31.5–35.7)
MCV: 90 fL (ref 79–97)
Monocytes Absolute: 0.8 x10E3/uL (ref 0.1–0.9)
Monocytes: 10 %
Neutrophils Absolute: 4.3 x10E3/uL (ref 1.4–7.0)
Neutrophils: 59 %
Platelets: 241 x10E3/uL (ref 150–450)
RBC: 3.99 x10E6/uL — ABNORMAL LOW (ref 4.14–5.80)
RDW: 13.9 % (ref 11.6–15.4)
WBC: 7.3 x10E3/uL (ref 3.4–10.8)

## 2024-02-20 NOTE — Telephone Encounter (Signed)
 FYI Only or Action Required?: FYI only for provider: information only call.  Patient was last seen in primary care on 02/19/2024 by Kotturi, Vinay K, MD.  Called Nurse Triage reporting Advice Only.  Symptoms began information only call.  Interventions attempted: Other: information only call.  Symptoms are: information only call.  Triage Disposition: Information or Advice Only Call  Patient/caregiver understands and will follow disposition?: Yes                              Reason for Disposition  General information question, no triage required and triager able to answer question  Answer Assessment - Initial Assessment Questions 1. REASON FOR CALL: What is the main reason for your call? or How can I best help you?     This RN was returning a call to patient after call disconnected earlier this morning. Patient stated PCP called him 5-10 minutes prior to this call and was able to order him oxygen. Patient has no additional needs at this time. This RN advised patient to call back if symptoms worsen. Patient verbalized understanding.  Protocols used: Information Only Call - No Triage-A-AH

## 2024-02-20 NOTE — Progress Notes (Signed)
 Hi Dr.[name],  I reviewed labs and chest xray results with the pts wife [name].     Chest xray shows low lungs volumes, unlikely in CHF exacerbation or concerning for any acute pathology. Mild hyponatremia and Anemia noted.  Recommended to follow up with PCP/cardiology since pt is not responding to Lasix . BNP is still pending.  Vinary K Nell Gales, MD

## 2024-02-20 NOTE — Telephone Encounter (Signed)
 Copied from CRM #[id number]. Topic: Clinical - Red Word Triage >> Feb 20, 2024  9:34 AM Winona R wrote: Pt wife calling as the Pt is still having a hard time breathing when laying down since going to acute appointment yesterday in with sports med in [place]. Pt wife believes Dr. [name] may need to order him oxygen  Pt.'s wife disconnected call before she could be transferred to Triage. Called back, no answer. Unable to leave a message.

## 2024-02-23 ENCOUNTER — Telehealth: Payer: Self-pay

## 2024-02-23 ENCOUNTER — Ambulatory Visit (INDEPENDENT_AMBULATORY_CARE_PROVIDER_SITE_OTHER): Admitting: Vascular Surgery

## 2024-02-23 NOTE — Telephone Encounter (Signed)
 Copied from CRM [id number]. Topic: Clinical - Order For Equipment >> Feb 23, 2024  5:02 PM Delon DASEN wrote: Reason for CRM: Patient needs update on oxygen- [phone], patient struggles to breathe when he lays down.

## 2024-02-24 LAB — AEROBIC CULTURE W GRAM STAIN (SUPERFICIAL SPECIMEN)

## 2024-02-25 ENCOUNTER — Other Ambulatory Visit: Payer: Self-pay

## 2024-02-25 ENCOUNTER — Telehealth (HOSPITAL_BASED_OUTPATIENT_CLINIC_OR_DEPARTMENT_OTHER): Payer: Self-pay

## 2024-02-25 ENCOUNTER — Encounter: Payer: Self-pay | Admitting: Emergency Medicine

## 2024-02-25 ENCOUNTER — Inpatient Hospital Stay
Admission: EM | Admit: 2024-02-25 | Discharge: 2024-02-28 | DRG: 291 | Disposition: A | Discharge status: Home Health | Attending: Internal Medicine | Admitting: Internal Medicine

## 2024-02-25 ENCOUNTER — Emergency Department

## 2024-02-25 DIAGNOSIS — Z79899 Other long term (current) drug therapy: Secondary | ICD-10-CM

## 2024-02-25 DIAGNOSIS — F1011 Alcohol abuse, in remission: Secondary | ICD-10-CM | POA: Diagnosis present

## 2024-02-25 DIAGNOSIS — I5023 Acute on chronic systolic (congestive) heart failure: Secondary | ICD-10-CM | POA: Diagnosis not present

## 2024-02-25 DIAGNOSIS — T8131XA Disruption of external operation (surgical) wound, not elsewhere classified, initial encounter: Secondary | ICD-10-CM | POA: Diagnosis present

## 2024-02-25 DIAGNOSIS — Z9862 Peripheral vascular angioplasty status: Secondary | ICD-10-CM

## 2024-02-25 DIAGNOSIS — I110 Hypertensive heart disease with heart failure: Principal | ICD-10-CM | POA: Diagnosis present

## 2024-02-25 DIAGNOSIS — I509 Heart failure, unspecified: Secondary | ICD-10-CM

## 2024-02-25 DIAGNOSIS — I739 Peripheral vascular disease, unspecified: Secondary | ICD-10-CM | POA: Diagnosis present

## 2024-02-25 DIAGNOSIS — M109 Gout, unspecified: Secondary | ICD-10-CM | POA: Diagnosis present

## 2024-02-25 DIAGNOSIS — G4733 Obstructive sleep apnea (adult) (pediatric): Secondary | ICD-10-CM | POA: Diagnosis present

## 2024-02-25 DIAGNOSIS — Z961 Presence of intraocular lens: Secondary | ICD-10-CM | POA: Diagnosis present

## 2024-02-25 DIAGNOSIS — Z89511 Acquired absence of right leg below knee: Secondary | ICD-10-CM

## 2024-02-25 DIAGNOSIS — E1142 Type 2 diabetes mellitus with diabetic polyneuropathy: Secondary | ICD-10-CM | POA: Diagnosis present

## 2024-02-25 DIAGNOSIS — Z7982 Long term (current) use of aspirin: Secondary | ICD-10-CM

## 2024-02-25 DIAGNOSIS — T148XXD Other injury of unspecified body region, subsequent encounter: Secondary | ICD-10-CM | POA: Diagnosis present

## 2024-02-25 DIAGNOSIS — E860 Dehydration: Secondary | ICD-10-CM | POA: Diagnosis present

## 2024-02-25 DIAGNOSIS — E1151 Type 2 diabetes mellitus with diabetic peripheral angiopathy without gangrene: Secondary | ICD-10-CM | POA: Diagnosis present

## 2024-02-25 DIAGNOSIS — Z8249 Family history of ischemic heart disease and other diseases of the circulatory system: Secondary | ICD-10-CM

## 2024-02-25 DIAGNOSIS — R001 Bradycardia, unspecified: Secondary | ICD-10-CM | POA: Diagnosis present

## 2024-02-25 DIAGNOSIS — Z66 Do not resuscitate: Secondary | ICD-10-CM | POA: Diagnosis present

## 2024-02-25 DIAGNOSIS — Z95820 Peripheral vascular angioplasty status with implants and grafts: Secondary | ICD-10-CM

## 2024-02-25 DIAGNOSIS — N400 Enlarged prostate without lower urinary tract symptoms: Secondary | ICD-10-CM | POA: Diagnosis present

## 2024-02-25 DIAGNOSIS — Z951 Presence of aortocoronary bypass graft: Secondary | ICD-10-CM

## 2024-02-25 DIAGNOSIS — F419 Anxiety disorder, unspecified: Secondary | ICD-10-CM | POA: Diagnosis present

## 2024-02-25 DIAGNOSIS — I255 Ischemic cardiomyopathy: Secondary | ICD-10-CM | POA: Diagnosis present

## 2024-02-25 DIAGNOSIS — R0602 Shortness of breath: Secondary | ICD-10-CM | POA: Diagnosis not present

## 2024-02-25 DIAGNOSIS — H9190 Unspecified hearing loss, unspecified ear: Secondary | ICD-10-CM | POA: Diagnosis present

## 2024-02-25 DIAGNOSIS — Z9841 Cataract extraction status, right eye: Secondary | ICD-10-CM

## 2024-02-25 DIAGNOSIS — I272 Pulmonary hypertension, unspecified: Secondary | ICD-10-CM | POA: Diagnosis present

## 2024-02-25 DIAGNOSIS — Z794 Long term (current) use of insulin: Secondary | ICD-10-CM

## 2024-02-25 DIAGNOSIS — E785 Hyperlipidemia, unspecified: Secondary | ICD-10-CM | POA: Diagnosis present

## 2024-02-25 DIAGNOSIS — Z9842 Cataract extraction status, left eye: Secondary | ICD-10-CM

## 2024-02-25 DIAGNOSIS — Y848 Other medical procedures as the cause of abnormal reaction of the patient, or of later complication, without mention of misadventure at the time of the procedure: Secondary | ICD-10-CM | POA: Diagnosis present

## 2024-02-25 DIAGNOSIS — I10 Essential (primary) hypertension: Secondary | ICD-10-CM | POA: Diagnosis present

## 2024-02-25 DIAGNOSIS — R0601 Orthopnea: Secondary | ICD-10-CM

## 2024-02-25 DIAGNOSIS — I2510 Atherosclerotic heart disease of native coronary artery without angina pectoris: Secondary | ICD-10-CM | POA: Diagnosis present

## 2024-02-25 DIAGNOSIS — Z955 Presence of coronary angioplasty implant and graft: Secondary | ICD-10-CM

## 2024-02-25 DIAGNOSIS — I252 Old myocardial infarction: Secondary | ICD-10-CM

## 2024-02-25 DIAGNOSIS — Z89611 Acquired absence of right leg above knee: Secondary | ICD-10-CM

## 2024-02-25 DIAGNOSIS — J9601 Acute respiratory failure with hypoxia: Secondary | ICD-10-CM | POA: Diagnosis present

## 2024-02-25 DIAGNOSIS — G4730 Sleep apnea, unspecified: Secondary | ICD-10-CM | POA: Diagnosis present

## 2024-02-25 DIAGNOSIS — Z7902 Long term (current) use of antithrombotics/antiplatelets: Secondary | ICD-10-CM

## 2024-02-25 DIAGNOSIS — G4700 Insomnia, unspecified: Secondary | ICD-10-CM | POA: Diagnosis present

## 2024-02-25 LAB — BRAIN NATRIURETIC PEPTIDE: B Natriuretic Peptide: 2345.4 pg/mL — ABNORMAL HIGH (ref 0.0–100.0)

## 2024-02-25 LAB — URINALYSIS, ROUTINE W REFLEX MICROSCOPIC
Bacteria, UA: NONE SEEN
Bilirubin Urine: NEGATIVE
Glucose, UA: >=500 mg/dL — AB
Hgb urine dipstick: NEGATIVE
Ketones, ur: NEGATIVE mg/dL
Leukocytes,Ua: NEGATIVE
Nitrite: NEGATIVE
Protein, ur: 100 mg/dL — AB
Specific Gravity, Urine: 1.017 (ref 1.005–1.030)
Squamous Epithelial / HPF: 0 /HPF (ref 0–5)
pH: 5 (ref 5.0–8.0)

## 2024-02-25 LAB — BASIC METABOLIC PANEL WITH GFR
Anion gap: 13 (ref 5–15)
BUN: 21 mg/dL (ref 8–23)
CO2: 23 mmol/L (ref 22–32)
Calcium: 9.1 mg/dL (ref 8.9–10.3)
Chloride: 99 mmol/L (ref 98–111)
Creatinine, Ser: 1.01 mg/dL (ref 0.61–1.24)
GFR, Estimated: >60 mL/min (ref >60)
Glucose, Bld: 198 mg/dL — ABNORMAL HIGH (ref 70–99)
Potassium: 4.7 mmol/L (ref 3.5–5.1)
Sodium: 135 mmol/L (ref 135–145)

## 2024-02-25 LAB — CBC
HCT: 35.1 % — ABNORMAL LOW (ref 39.0–52.0)
Hemoglobin: 11.3 g/dL — ABNORMAL LOW (ref 13.0–17.0)
MCH: 28.2 pg (ref 26.0–34.0)
MCHC: 32.2 g/dL (ref 30.0–36.0)
MCV: 87.5 fL (ref 80.0–100.0)
Platelets: 268 K/uL (ref 150–400)
RBC: 4.01 MIL/uL — ABNORMAL LOW (ref 4.22–5.81)
RDW: 15.1 % (ref 11.5–15.5)
WBC: 7.1 K/uL (ref 4.0–10.5)
nRBC: 0 % (ref 0.0–0.2)

## 2024-02-25 LAB — TROPONIN I (HIGH SENSITIVITY)
Troponin I (High Sensitivity): 15 ng/L (ref <18)
Troponin I (High Sensitivity): 15 ng/L (ref <18)

## 2024-02-25 LAB — MAGNESIUM: Magnesium: 2.2 mg/dL (ref 1.7–2.4)

## 2024-02-25 LAB — D-DIMER, QUANTITATIVE: D-Dimer, Quant: 0.72 ug{FEU}/mL — ABNORMAL HIGH (ref 0.00–0.50)

## 2024-02-25 MED ORDER — INSULIN ASPART 100 UNIT/ML IJ SOLN
0.0000 [IU] | Freq: Three times a day (TID) | INTRAMUSCULAR | Status: DC
  Administered 2024-02-26: 2 [IU] via SUBCUTANEOUS
  Administered 2024-02-26 (×2): 3 [IU] via SUBCUTANEOUS
  Administered 2024-02-27: 2 [IU] via SUBCUTANEOUS
  Administered 2024-02-27: 3 [IU] via SUBCUTANEOUS
  Administered 2024-02-27: 2 [IU] via SUBCUTANEOUS
  Administered 2024-02-28 (×2): 3 [IU] via SUBCUTANEOUS
  Filled 2024-02-25 (×7): qty 1

## 2024-02-25 MED ORDER — CARVEDILOL 3.125 MG PO TABS
3.1250 mg | ORAL_TABLET | Freq: Two times a day (BID) | ORAL | Status: DC
  Administered 2024-02-26: 3.125 mg via ORAL
  Filled 2024-02-25: qty 1

## 2024-02-25 MED ORDER — ACETAMINOPHEN 650 MG RE SUPP
650.0000 mg | Freq: Four times a day (QID) | RECTAL | Status: DC | PRN
Start: 2024-02-25 — End: 2024-02-28

## 2024-02-25 MED ORDER — PANTOPRAZOLE SODIUM 40 MG PO TBEC
40.0000 mg | DELAYED_RELEASE_TABLET | Freq: Every day | ORAL | Status: DC
  Administered 2024-02-26 – 2024-02-28 (×3): 40 mg via ORAL
  Filled 2024-02-25 (×3): qty 1

## 2024-02-25 MED ORDER — ESCITALOPRAM OXALATE 10 MG PO TABS: 10.0000 mg | ORAL_TABLET | Freq: Every day | ORAL | Status: DC

## 2024-02-25 MED ORDER — CLOPIDOGREL BISULFATE 75 MG PO TABS
75.0000 mg | ORAL_TABLET | Freq: Every day | ORAL | Status: DC
  Administered 2024-02-26 – 2024-02-28 (×3): 75 mg via ORAL
  Filled 2024-02-25 (×3): qty 1

## 2024-02-25 MED ORDER — FUROSEMIDE 10 MG/ML IJ SOLN
40.0000 mg | Freq: Two times a day (BID) | INTRAMUSCULAR | Status: DC
  Administered 2024-02-26: 40 mg via INTRAVENOUS
  Filled 2024-02-25: qty 4

## 2024-02-25 MED ORDER — ONDANSETRON HCL 4 MG/2ML IJ SOLN: 4.0000 mg | Freq: Four times a day (QID) | INTRAMUSCULAR | Status: DC | PRN

## 2024-02-25 MED ORDER — GABAPENTIN 100 MG PO CAPS
100.0000 mg | ORAL_CAPSULE | Freq: Every day | ORAL | Status: DC
  Administered 2024-02-26 – 2024-02-27 (×3): 100 mg via ORAL
  Filled 2024-02-25 (×3): qty 1

## 2024-02-25 MED ORDER — THIAMINE MONONITRATE 100 MG PO TABS
100.0000 mg | ORAL_TABLET | Freq: Every day | ORAL | Status: DC
  Administered 2024-02-26 – 2024-02-28 (×3): 100 mg via ORAL
  Filled 2024-02-25 (×3): qty 1

## 2024-02-25 MED ORDER — ACETAMINOPHEN 325 MG PO TABS
650.0000 mg | ORAL_TABLET | Freq: Four times a day (QID) | ORAL | Status: DC | PRN
  Administered 2024-02-26 (×3): 650 mg via ORAL
  Filled 2024-02-25 (×3): qty 2

## 2024-02-25 MED ORDER — POTASSIUM CHLORIDE CRYS ER 10 MEQ PO TBCR
10.0000 meq | EXTENDED_RELEASE_TABLET | Freq: Two times a day (BID) | ORAL | Status: DC
  Administered 2024-02-26 – 2024-02-28 (×5): 10 meq via ORAL
  Filled 2024-02-25 (×5): qty 1

## 2024-02-25 MED ORDER — DAPAGLIFLOZIN PROPANEDIOL 10 MG PO TABS
10.0000 mg | ORAL_TABLET | Freq: Every day | ORAL | Status: DC
  Filled 2024-02-25: qty 1

## 2024-02-25 MED ORDER — ATORVASTATIN CALCIUM 80 MG PO TABS
80.0000 mg | ORAL_TABLET | Freq: Every day | ORAL | Status: DC
  Administered 2024-02-26 – 2024-02-27 (×3): 80 mg via ORAL
  Filled 2024-02-25 (×3): qty 1

## 2024-02-25 MED ORDER — ONDANSETRON HCL 4 MG PO TABS
4.0000 mg | ORAL_TABLET | Freq: Four times a day (QID) | ORAL | Status: DC | PRN
  Administered 2024-02-26: 4 mg via ORAL
  Filled 2024-02-25: qty 1

## 2024-02-25 MED ORDER — NITROGLYCERIN 0.4 MG SL SUBL: 0.4000 mg | SUBLINGUAL_TABLET | SUBLINGUAL | Status: DC | PRN

## 2024-02-25 MED ORDER — FLUOXETINE HCL 20 MG PO CAPS
20.0000 mg | ORAL_CAPSULE | Freq: Every day | ORAL | Status: DC
  Administered 2024-02-26 – 2024-02-28 (×3): 20 mg via ORAL
  Filled 2024-02-25 (×3): qty 1

## 2024-02-25 MED ORDER — FUROSEMIDE 10 MG/ML IJ SOLN
20.0000 mg | Freq: Once | INTRAMUSCULAR | Status: AC
  Administered 2024-02-25: 20 mg via INTRAVENOUS
  Filled 2024-02-25: qty 4

## 2024-02-25 MED ORDER — ASPIRIN 81 MG PO TBEC
81.0000 mg | DELAYED_RELEASE_TABLET | Freq: Every day | ORAL | Status: DC
  Administered 2024-02-26 – 2024-02-28 (×3): 81 mg via ORAL
  Filled 2024-02-25 (×3): qty 1

## 2024-02-25 MED ORDER — ENOXAPARIN SODIUM 40 MG/0.4ML IJ SOSY
40.0000 mg | PREFILLED_SYRINGE | INTRAMUSCULAR | Status: DC
  Administered 2024-02-26 – 2024-02-27 (×3): 40 mg via SUBCUTANEOUS
  Filled 2024-02-25 (×3): qty 0.4

## 2024-02-25 MED ORDER — SACUBITRIL-VALSARTAN 24-26 MG PO TABS
1.0000 | ORAL_TABLET | Freq: Two times a day (BID) | ORAL | Status: DC
  Administered 2024-02-26: 1 via ORAL
  Filled 2024-02-25 (×2): qty 1

## 2024-02-25 MED ORDER — POLYETHYLENE GLYCOL 3350 17 G PO PACK
17.0000 g | PACK | Freq: Every day | ORAL | Status: DC
  Administered 2024-02-27 – 2024-02-28 (×2): 17 g via ORAL
  Filled 2024-02-25 (×3): qty 1

## 2024-02-25 MED ORDER — TRAZODONE HCL 100 MG PO TABS
100.0000 mg | ORAL_TABLET | Freq: Every day | ORAL | Status: DC
  Administered 2024-02-26 – 2024-02-27 (×3): 100 mg via ORAL
  Filled 2024-02-25 (×3): qty 1

## 2024-02-25 MED ORDER — INSULIN ASPART 100 UNIT/ML IJ SOLN: 0.0000 [IU] | Freq: Every day | INTRAMUSCULAR | Status: DC

## 2024-02-25 MED ORDER — FOLIC ACID 1 MG PO TABS
1.0000 mg | ORAL_TABLET | Freq: Every day | ORAL | Status: DC
  Administered 2024-02-26 – 2024-02-28 (×3): 1 mg via ORAL
  Filled 2024-02-25 (×3): qty 1

## 2024-02-25 MED ORDER — FERROUS SULFATE 325 (65 FE) MG PO TABS
325.0000 mg | ORAL_TABLET | Freq: Every day | ORAL | Status: DC
  Administered 2024-02-26 – 2024-02-28 (×3): 325 mg via ORAL
  Filled 2024-02-25 (×3): qty 1

## 2024-02-25 NOTE — Telephone Encounter (Signed)
 Left VM message with Norene Ligas ([employer]) to return call to [place], CMA regarding patient's oxygen status.

## 2024-02-25 NOTE — Assessment & Plan Note (Signed)
 No complaints of chest pain, first troponin 15 and EKG nonacute Continue aspirin , atorvastatin , carvedilol  and NTG as needed

## 2024-02-25 NOTE — Assessment & Plan Note (Signed)
Sliding scale insulin coverage Continue gabapentin

## 2024-02-25 NOTE — Assessment & Plan Note (Signed)
-   Monitor for withdrawal.

## 2024-02-25 NOTE — Assessment & Plan Note (Signed)
-   Continue carvedilol

## 2024-02-25 NOTE — H&P (Signed)
 History and Physical    Patient: Manuel [name] FMW:[id number] DOB: May 21, 1945 DOA: 02/25/2024 DOS: the patient was seen and examined on 02/25/2024 PCP: [name], [name], MD  Patient coming from: Home  Chief Complaint:  Chief Complaint  Patient presents with   Shortness of Breath    HPI: Manuel [name] is a 78 y.o. male with medical history significant for DM, HTN, OSA,  HFrEF 25-30%,  CAD s/p multiple stents,carotid artery stenosis,, extensive PAD s/p stent placement right femoral artery complicated by femoral artery rupture/repair/wound dehiscence/serratia infection s/p ertapenem /Bactrim  on wound VAC being admitted with a CHF exacerbation. After presenting with a few week history of progressively worsening shortness of breath, now with associated orthopnea.  Denies chest pain, cough, fever or chills.  Denies leg pain.  In the ED BP 163/92, pulse 69, respirations 20 and saturating at 100% on room air.  Afebrile. Labs troponin 15, BNP 2345 and D-dimer 0.72 Hemoglobin at baseline at 11.3 BMP unremarkable Urinalysis not consistent with infection Magnesium  normal EKG: NSR at 60 Chest x-ray shows cardiomegaly with vascular congestion and interstitial edema and small bilateral pleural effusions Patient treated with IV Lasix  20 mg Admission requested.     Review of Systems: As mentioned in the history of present illness. All other systems reviewed and are negative.  Past Medical History:  Diagnosis Date   Acute ST elevation myocardial infarction (STEMI) of inferior wall (HCC) 04/19/2010   a.) transfered from [facility] to [facility] --> LHC/PCI (very difficult procedure) --> 3.0 x 23 mm and 3.0 x 12 mm Xience stents to RCA   Allergies    Arthritis    Benign essential hypertension    Bilateral carotid artery disease 05/08/2021   a.) carotid doppler 05/08/2021: 1-39% BICA   CAD (coronary artery disease) 04/19/2010   a.) inferior STEMI 04/19/2010 --> LHC/PCI: 50-70% pD1, 80% pRI, 90/90/90% RCA  (overlapping 3.0 x 23 and 3.0 x 12 mm Xience DES); b.) MV 11/10/2018: fixed minimally reversible inferior perfusion defect   Cellulitis of foot    Chronic HFrEF (heart failure with reduced ejection fraction) (HCC)    DDD (degenerative disc disease), cervical    Diabetes mellitus type 2, insulin  dependent (HCC)    Diverticulosis    Full dentures    Gout    Hard of hearing    History of bilateral cataract extraction 2022   History of ETOH abuse    Hyperlipidemia    Ischemic cardiomyopathy 04/19/2010   a.) TTE 04/19/2010: 40%; b.) TTE 04/20/2014: EF >55%; c.) TTE 11/10/2018: EF 45%; d.) TTE 11/14/2022: EF 25-30%; e. 06/2023 Echo: EF 25-30%, nl RV size/fxn. Mild MR. Mild-mod TR. Ao sclerosis w/o stenosis.   Long term current use of aspirin     Long term current use of clopidogrel     Lumbar degenerative disc disease    Lumbar radiculopathy    Lumbar vertebral fracture (chronic superior endplate of L1)    NSTEMI (non-ST elevated myocardial infarction) (HCC) 01/15/2023   OSA (obstructive sleep apnea)    a.) unable to tolerate nocturnal PAP therapy   Peripheral artery disease    a.) stenting 05/14/21: 12 mm x 12 cm LifeStent RIGHT dis SFA/prox pop; b.) s/p cath directed thrombolysis RIGHT SFA/pop 06/14/21; c.) s/p mech thrombectomy + stenting 06/15/21: 8 mm x 25cm & 8 mm x 7.5cm Viabahn; d.) s/p BILAT CFA, profunda femoris, SFA endarterectomies + fogarty embolectomy + stenting 11/15/22: 12mm x 58mm Lifestream BILAT CIAs, 14 mm x 6 cm Lifestream & 13 mm  x 5 cm Viabahn LEFT EIA   Peripheral neuropathy    Umbilical hernia    Past Surgical History:  Procedure Laterality Date   AMPUTATION Right 11/18/2022   Procedure: AMPUTATION 4TH AND 5TH RAY;  Surgeon: [name], [name];  Location: [facility];  Service: Orthopedics/Podiatry;  Laterality: Right;  4th and 5th toe   AMPUTATION Right 04/20/2023   Procedure: AMPUTATION BELOW KNEE;  Surgeon: [name], MD;  Location: [facility];  Service: Vascular;   Laterality: Right;  block then general   APPLICATION OF WOUND VAC Right 01/09/2023   Procedure: APPLICATION OF WOUND VAC;  Surgeon: [name], MD;  Location: [facility];  Service: Vascular;  Laterality: Right;   APPLICATION OF WOUND VAC Right 12/12/2023   Procedure: APPLICATION, WOUND VAC;  Surgeon: [name], MD;  Location: [facility];  Service: Vascular;  Laterality: Right;   APPLICATION OF WOUND VAC Right 12/17/2023   Procedure: APPLICATION, WOUND VAC;  Surgeon: [name], MD;  Location: [facility];  Service: Vascular;  Laterality: Right;  EXCHANGE   APPLICATION OF WOUND VAC Right 12/23/2023   Procedure: APPLICATION, WOUND VAC;  Surgeon: [name], MD;  Location: [facility];  Service: General;  Laterality: Right;   APPLICATION OF WOUND VAC Right 12/11/2023   Procedure: APPLICATION, WOUND VAC;  Surgeon: [name], MD;  Location: [facility];  Service: Vascular;  Laterality: Right;   CATARACT EXTRACTION W/PHACO Right 03/14/2021   Procedure: CATARACT EXTRACTION PHACO AND INTRAOCULAR LENS PLACEMENT (IOC) RIGHT DIABETIC;  Surgeon: [name], MD;  Location: [facility];  Service: Ophthalmology;  Laterality: Right;  Diabetic 16.78 01:39.9   CATARACT EXTRACTION W/PHACO Left 03/28/2021   Procedure: CATARACT EXTRACTION PHACO AND INTRAOCULAR LENS PLACEMENT (IOC) LEFT DIABETIC 6.93 01:22.0;  Surgeon: [name], MD;  Location: [facility];  Service: Ophthalmology;  Laterality: Left;  Diabetic   COLONOSCOPY     CORONARY ANGIOPLASTY WITH STENT PLACEMENT  03/2010   Procedure: CORONARY ANGIOPLASTY WITH STENT PLACEMENT; Location: [name]   CORONARY STENT INTERVENTION N/A 07/14/2023   Procedure: CORONARY STENT INTERVENTION;  Surgeon: [name], MD;  Location: [facility];  Service: Cardiovascular;  Laterality: N/A;   ENDARTERECTOMY FEMORAL Bilateral 11/15/2022   Procedure: BILATERAL COMMON FEMORAL PROFUNDA FEMORIS AND SUPERFICIAL FEMORAL ARTERY  ENDARTECTOMIES, RIGHT FOGARTY EMBOLECTOMY OF THE RIGHT SFA  AND  POPLITEAL ARTERIES. EZELLA AND RIGHT LOWER EXTREMITY ANGIOGRAM.;  Surgeon: [name], MD;  Location: [facility];  Service: Vascular;  Laterality: Bilateral;   ENDARTERECTOMY FEMORAL Right 12/11/2023   Procedure: ENDARTERECTOMY, FEMORAL;  Surgeon: [name], MD;  Location: [facility];  Service: Vascular;  Laterality: Right;  Re-do of femoral endarterectomy with sartorius flap   FEMORAL ARTERY EXPLORATION Right 12/12/2023   Procedure: EXPLORATION, ARTERY, FEMORAL- stop bleeding with suture repair anastomaosis , abx beads;  Surgeon: [name], MD;  Location: [facility];  Service: Vascular;  Laterality: Right;   GROIN DEBRIDEMENT Right 12/28/2023   Procedure: DEBRIDEMENT, INGUINAL REGION;  Surgeon: Elnor Norleen CROME, MD;  Location: [facility];  Service: Vascular;  Laterality: Right;   HEMATOMA EVACUATION N/A 12/12/2023   Procedure: EVACUATION HEMATOMA;  Surgeon: [name], MD;  Location: [facility];  Service: Vascular;  Laterality: N/A;   INSERTION OF ILIAC STENT Bilateral 11/15/2022   Procedure: BILATERAL STENT INSERTION IN BILATERAL  COMMON ILIAC ARTERY, STENT INSERTION OF LEFT EXTERNAL ILIAC ARTERY. ANGIOPLASTY RIGHT TIBIAL  AND POPLITEAL ARTERY.;  Surgeon: [name], MD;  Location: [facility];  Service: Vascular;  Laterality: Bilateral;   IRRIGATION AND DEBRIDEMENT KNEE Right 04/21/2023   Procedure: IRRIGATION AND DEBRIDEMENT KNEE;  Surgeon: Zafonte, Brian Duvall, MD;  Location: [facility];  Service: Orthopedics;  Laterality: Right;   LOWER EXTREMITY ANGIOGRAPHY Right 05/14/2021   Procedure: LOWER EXTREMITY ANGIOGRAPHY;  Surgeon: [name], MD;  Location: [facility];  Service: Cardiovascular;  Laterality: Right;   LOWER EXTREMITY ANGIOGRAPHY Right 06/14/2021   Procedure: Lower Extremity Angiography;  Surgeon: [name], MD;  Location: [facility];  Service: Cardiovascular;  Laterality: Right;   LOWER  EXTREMITY ANGIOGRAPHY Right 11/11/2022   Procedure: Lower Extremity Angiography;  Surgeon: [name], MD;  Location: [facility];  Service: Cardiovascular;  Laterality: Right;   LOWER EXTREMITY ANGIOGRAPHY Right 12/04/2023   Procedure: Lower Extremity Angiography;  Surgeon: [name], MD;  Location: [facility];  Service: Cardiovascular;  Laterality: Right;   LOWER EXTREMITY INTERVENTION Right 06/15/2021   Procedure: LOWER EXTREMITY INTERVENTION;  Surgeon: [name], MD;  Location: [facility];  Service: Cardiovascular;  Laterality: Right;   RIGHT/LEFT HEART CATH AND CORONARY ANGIOGRAPHY N/A 07/14/2023   Procedure: RIGHT/LEFT HEART CATH AND CORONARY ANGIOGRAPHY;  Surgeon: [name], MD;  Location: [facility];  Service: Cardiovascular;  Laterality: N/A;   TONSILLECTOMY     TRANSMETATARSAL AMPUTATION Right 02/17/2023   Procedure: TRANSMETATARSAL AMPUTATION;  Surgeon: Lennie Barter, [name];  Location: [facility];  Service: Orthopedics/Podiatry;  Laterality: Right;   WOUND DEBRIDEMENT Right 01/09/2023   Procedure: DEBRIDEMENT WOUND;  Surgeon: [name], MD;  Location: [facility];  Service: Vascular;  Laterality: Right;   Social History:  reports that he has never smoked. He has never been exposed to tobacco smoke. He has never used smokeless tobacco. He reports current alcohol  use of about 21.0 standard drinks of alcohol  per week. He reports that he does not use drugs.  No Known Allergies  Family History  Problem Relation Age of Onset   Scoliosis Mother    Heart disease Father     Prior to Admission medications   Medication Sig Start Date End Date Taking? Authorizing Provider  artificial tears ophthalmic solution Place 1 drop into both eyes as needed for dry eyes. 12/26/23   Patel, Sona, MD  aspirin  EC 81 MG tablet Take 1 tablet (81 mg total) by mouth daily. 11/26/22   Maree Hue, MD  atorvastatin  (LIPITOR ) 80 MG tablet Take 1 tablet (80 mg  total) by mouth at bedtime. 11/18/23   Gollan, Timothy J, MD  carvedilol  (COREG ) 3.125 MG tablet Take 1 tablet (3.125 mg total) by mouth 2 (two) times daily with a meal. 02/03/24 02/02/25  Gerard Frederick, NP  clopidogrel  (PLAVIX ) 75 MG tablet Take 1 tablet (75 mg total) by mouth daily. 11/18/23   Gollan, Timothy J, MD  dapagliflozin  propanediol (FARXIGA ) 10 MG TABS tablet Take 1 tablet (10 mg total) by mouth daily. 11/18/23   Gollan, Timothy J, MD  escitalopram  (LEXAPRO ) 10 MG tablet Take 1 tablet (10 mg total) by mouth daily. 01/29/24   [name], [name], MD  ferrous sulfate  325 (65 FE) MG EC tablet Take 325 mg by mouth daily with breakfast.    [provider]  FLUoxetine (PROZAC) 20 MG capsule Take 1 capsule (20 mg total) by mouth daily. 02/04/24   [name], [name], MD  fluticasone  (FLONASE ) 50 MCG/ACT nasal spray Place 1 spray into both nostrils  2 (two) times daily as needed for allergies or rhinitis. 01/01/24   Lenon Marien CROME, MD  folic acid  (FOLVITE ) 1 MG tablet Take 1 tablet (1 mg total) by mouth daily. 09/12/23   [name], [name], MD  furosemide  (LASIX ) 20 MG tablet Take 1 tablet (20 mg total) by mouth daily. 02/19/24   Kotturi, Vinay K, MD  gabapentin  (NEURONTIN ) 100 MG capsule Take 1 capsule (100 mg total) by mouth at bedtime. 02/04/24   [name], [name], MD  insulin  aspart protamine  - aspart (NOVOLOG  70/30 MIX) (70-30) 100 UNIT/ML FlexPen Inject 3 Units into the skin 2 (two) times daily with a meal. 01/26/24   Dezii, Alexandra, DO  loratadine  (CLARITIN ) 10 MG tablet Take 1 tablet (10 mg total) by mouth daily as needed for allergies. 01/01/24   Lenon Marien CROME, MD  nitroGLYCERIN  (NITROSTAT ) 0.4 MG SL tablet Place 1 tablet (0.4 mg total) under the tongue every 5 (five) minutes as needed for chest pain. Max: 3 tablets per day 01/01/24 12/31/24  Lenon Marien CROME, MD  pantoprazole  (PROTONIX ) 40 MG tablet Take 1 tablet (40 mg total) by mouth daily. 02/03/24   Gerard Frederick, NP  polyethylene  glycol (MIRALAX  / GLYCOLAX ) 17 g packet Take 17 g by mouth daily. 01/01/24   Lenon Marien CROME, MD  potassium chloride  (KLOR-CON  M) 10 MEQ tablet Take 1 tablet (10 mEq total) by mouth 2 (two) times daily for 7 days. 02/19/24 02/26/24  Kotturi, Vinay K, MD  sacubitril -valsartan  (ENTRESTO ) 24-26 MG Take 1 tablet by mouth every 12 (twelve) hours. 11/18/23 11/17/24  Gollan, Timothy J, MD  thiamine  (VITAMIN B-1) 100 MG tablet Take 1 tablet (100 mg total) by mouth daily. 02/20/23   Amin, Sumayya, MD  traZODone  (DESYREL ) 100 MG tablet Take 1 tablet (100 mg total) by mouth at bedtime. 02/06/24   [name], Devere POUR, MD    Physical Exam: Vitals:   02/25/24 1631 02/25/24 1633  BP:  (!) 163/92  Pulse:  69  Resp:  20  Temp:  98 F (36.7 C)  TempSrc:  Oral  SpO2:  100%  Weight: 83.9 kg   Height: 5' 8 (1.727 m)    Physical Exam  Labs on Admission: I have personally reviewed following labs and imaging studies  CBC: Recent Labs  Lab 02/19/24 1522 02/25/24 1641  WBC 7.3 7.1  NEUTROABS 4.3  --   HGB 11.5* 11.3*  HCT 35.7* 35.1*  MCV 90 87.5  PLT 241 268   Basic Metabolic Panel: Recent Labs  Lab 02/19/24 1522 02/25/24 1641 02/25/24 1846  NA 131* 135  --   K 5.2 4.7  --   CL 97 99  --   CO2 18* 23  --   GLUCOSE 192* 198*  --   BUN 21 21  --   CREATININE 0.87 1.01  --   CALCIUM  9.5 9.1  --   MG  --   --  2.2   GFR: Estimated Creatinine Clearance: 61.6 mL/min (by C-G formula based on SCr of 1.01 mg/dL). Liver Function Tests: Recent Labs  Lab 02/19/24 1522  AST 20  ALT 19  ALKPHOS 139*  BILITOT 0.6  PROT 7.2  ALBUMIN 4.2   No results for input(s): LIPASE, AMYLASE in the last 168 hours. No results for input(s): AMMONIA in the last 168 hours. Coagulation Profile: No results for input(s): INR, PROTIME in the last 168 hours. Cardiac Enzymes: No results for input(s): CKTOTAL, CKMB, CKMBINDEX, TROPONINI in the last 168 hours.  BNP (last 3 results) Recent Labs     07/21/23 1246  PROBNP 9,804*   HbA1C: No results for input(s): HGBA1C in the last 72 hours. CBG: No results for input(s): GLUCAP in the last 168 hours. Lipid Profile: No results for input(s): CHOL, HDL, LDLCALC, TRIG, CHOLHDL, LDLDIRECT in the last 72 hours. Thyroid Function Tests: No results for input(s): TSH, T4TOTAL, FREET4, T3FREE, THYROIDAB in the last 72 hours. Anemia Panel: No results for input(s): VITAMINB12, FOLATE, FERRITIN, TIBC, IRON , RETICCTPCT in the last 72 hours. Urine analysis:    Component Value Date/Time   COLORURINE YELLOW (A) 02/25/2024 1846   APPEARANCEUR CLEAR (A) 02/25/2024 1846   LABSPEC 1.017 02/25/2024 1846   PHURINE 5.0 02/25/2024 1846   GLUCOSEU >=500 (A) 02/25/2024 1846   HGBUR NEGATIVE 02/25/2024 1846   BILIRUBINUR NEGATIVE 02/25/2024 1846   KETONESUR NEGATIVE 02/25/2024 1846   PROTEINUR 100 (A) 02/25/2024 1846   NITRITE NEGATIVE 02/25/2024 1846   LEUKOCYTESUR NEGATIVE 02/25/2024 1846    Radiological Exams on Admission: DG Chest 2 View Result Date: 02/25/2024 EXAM: 2 VIEW(S) XRAY OF THE CHEST 02/25/2024 05:10:00 PM COMPARISON: 02/19/2024 CLINICAL HISTORY: Shortness of breath. FINDINGS: LUNGS AND PLEURA: No focal pulmonary opacity. Interstitial prominence likely reflects interstitial edema. Small bilateral effusions. No pneumothorax. HEART AND MEDIASTINUM: Cardiomegaly with vascular congestion. BONES AND SOFT TISSUES: No acute osseous abnormality. IMPRESSION: 1. Cardiomegaly with vascular congestion and interstitial edema. 2. Small bilateral pleural effusions. Electronically signed by: Franky Crease MD 02/25/2024 05:23 PM EST RP Workstation: [id number]   Data Reviewed for HPI: Relevant notes from primary care and specialist visits, past discharge summaries as available in EHR, including Care Everywhere. Prior diagnostic testing as pertinent to current admission diagnoses Updated medications and problem lists for  reconciliation ED course, including vitals, labs, imaging, treatment and response to treatment Triage notes, nursing and pharmacy notes and ED provider's notes Notable results as noted above in HPI      Assessment and Plan: Acute on chronic HFrEF (heart failure with reduced ejection fraction) (HCC) Ischemic cardiomyopathy, EF 20 to 25%, (12/05/2023) History of NSVT Patient presents with dyspnea on exertion, orthopnea, BNP 2345 and chest x-ray consistent with CHF Magnesium  2.2 and potassium above 4 IV Lasix  Continue carvedilol  and Entresto  Daily weights with intake and output monitoring  PAD with history of prior angioplasty and stent placement (peripheral artery disease) History of wound dehiscence right groin s/p femoral artery repair (12/2023) 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 Carotid artery stenosis Continue aspirin  , plavix  and atorvastatin   Diabetic polyneuropathy associated with type 2 diabetes mellitus (HCC) Sliding scale insulin  coverage Continue gabapentin   Coronary artery disease with history of myocardial infarction without history of CABG No complaints of chest pain, first troponin 15 and EKG nonacute Continue aspirin , atorvastatin , carvedilol  and NTG as needed  Essential hypertension Continue carvedilol   Sleep apnea CPAP if desired  History of alcohol  abuse Monitor for withdrawal  BPH (benign prostatic hyperplasia) No acute concerns Continue home meds pending med rec        DVT prophylaxis: Lovenox   Consults: none  Advance Care Planning:   Code Status: Prior   Family Communication: none  Disposition Plan: Back to previous home environment  Severity of Illness: The appropriate patient status for this patient is OBSERVATION. Observation status is judged to be reasonable and necessary in order to provide the required intensity of service to ensure the patient's safety. The patient's presenting symptoms, physical exam findings, and initial radiographic and  laboratory data in the context of their  medical condition is felt to place them at decreased risk for further clinical deterioration. Furthermore, it is anticipated that the patient will be medically stable for discharge from the hospital within 2 midnights of admission.   Author: Delayne LULLA Solian, MD 02/25/2024 9:17 PM  For on call review [email or web address].

## 2024-02-25 NOTE — Assessment & Plan Note (Signed)
 No acute concerns Continue home meds pending med rec

## 2024-02-25 NOTE — ED Triage Notes (Signed)
 Pt to ER with c/o increasing shortness of breath over the last few weeks that gets worse when he lays down.  Pt is talking in complete uninterrupted sentences.  States he was on home O2 last year but it had gotten better.

## 2024-02-25 NOTE — Assessment & Plan Note (Addendum)
 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 

## 2024-02-25 NOTE — Assessment & Plan Note (Addendum)
 Ischemic cardiomyopathy, EF 20 to 25%, (12/05/2023) History of NSVT Suspect related to medication adjustment(Entresto  and spironolactone  DC'd by PCP due to hypotension 3 weeks prior) Patient presents with dyspnea on exertion, orthopnea, BNP 2345 and chest x-ray consistent with CHF Worsening chest x-ray findings and spite of initiating oral furosemide  a week prior Magnesium  2.2 and potassium above 4 IV Lasix  Resume carvedilol  and Entresto .  Continue Jardiance.  Add spironolactone  if blood pressure tolerates Daily weights with intake and output monitoring

## 2024-02-25 NOTE — Assessment & Plan Note (Signed)
 CPAP if desired

## 2024-02-25 NOTE — ED Provider Notes (Signed)
 [facility] Provider Note    Event Date/Time   First MD Initiated Contact with Patient 02/25/24 1754     (approximate)   History   Shortness of Breath   HPI  Manuel [name] is a 78 y.o. male heart failure, diabetes, hypertension, hyperlipidemia, ischemic cardiomyopathy, OSA peripheral artery disease right BKA he presents to the emergency department with 2 weeks of progressively worsening shortness of breath.  Initially shortness of breath was only with exertion but is now constant especially when lying flat.  He endorses some abdominal distention he states that his spironolactone  and Catalina was recently changed.  He is currently being treated for a sinus infection with doxycycline .  He previously used oxygen at the beginning of this year but has not used it since March.  He reports otherwise compliance of his medications.  He is a non-smoker.  He presents with his wife who contributes to the history.  Denies any chest pain abdominal pain or changes in urinary bowel habits.      Physical Exam   Triage Vital Signs: ED Triage Vitals  Encounter Vitals Group     BP 02/25/24 1633 (!) 163/92     Girls Systolic BP Percentile --      Girls Diastolic BP Percentile --      Boys Systolic BP Percentile --      Boys Diastolic BP Percentile --      Pulse Rate 02/25/24 1633 69     Resp 02/25/24 1633 20     Temp 02/25/24 1633 98 F (36.7 C)     Temp Source 02/25/24 1633 Oral     SpO2 02/25/24 1633 100 %     Weight 02/25/24 1631 185 lb (83.9 kg)     Height 02/25/24 1631 5' 8 (1.727 m)     Head Circumference --      Peak Flow --      Pain Score 02/25/24 1630 0     Pain Loc --      Pain Education --      Exclude from Growth Chart --     Most recent vital signs: Vitals:   02/25/24 1633  BP: (!) 163/92  Pulse: 69  Resp: 20  Temp: 98 F (36.7 C)  SpO2: 100%    Nursing Triage Note reviewed. Vital signs reviewed and patients oxygen saturation is normoxic,  but increased respiratory rate  General: Patient is well nourished, well developed, awake and alert, resting comfortably in no acute distress Head: Normocephalic and atraumatic Eyes: Normal inspection, extraocular muscles intact, no conjunctival pallor Ear, nose, throat: Normal external exam Neck: Normal range of motion Respiratory: Patient is in mild respiratory distress, lungs rales Cardiovascular: Patient is not tachycardic, RR GI: Abd SNT with no guarding or rebound  Back: Normal inspection of the back with good strength and range of motion throughout all ext Extremities: pulses intact with good cap refills, no LE pitting edema or calf tenderness Right BKA with stump, no erythema Neuro: The patient is alert and oriented to person, place, and time, appropriately conversive, with 5/5 bilat UE/LE strength, no gross motor or sensory defects noted. Coordination appears to be adequate. Skin: Warm, dry, and intact Psych: normal mood and affect, no SI or HI  ED Results / Procedures / Treatments   Labs (all labs ordered are listed, but only abnormal results are displayed) Labs Reviewed  BASIC METABOLIC PANEL WITH GFR - Abnormal; Notable for the following components:  Result Value   Glucose, Bld 198 (*)    All other components within normal limits  CBC - Abnormal; Notable for the following components:   RBC 4.01 (*)    Hemoglobin 11.3 (*)    HCT 35.1 (*)    All other components within normal limits  BRAIN NATRIURETIC PEPTIDE - Abnormal; Notable for the following components:   B Natriuretic Peptide 2,345.4 (*)    All other components within normal limits  URINALYSIS, ROUTINE W REFLEX MICROSCOPIC - Abnormal; Notable for the following components:   Color, Urine YELLOW (*)    APPearance CLEAR (*)    Glucose, UA >=500 (*)    Protein, ur 100 (*)    All other components within normal limits  D-DIMER, QUANTITATIVE - Abnormal; Notable for the following components:   D-Dimer, Quant 0.72  (*)    All other components within normal limits  MAGNESIUM   TROPONIN I (HIGH SENSITIVITY)     EKG EKG and rhythm strip are interpreted by myself:   EKG: [Normal sinus rhythm] at heart rate of 69, normal QRS duration, QTc 484, nonspecific ST segments and T waves no ectopy EKG not consistent with Acute STEMI Rhythm strip: NSR in lead II   RADIOLOGY CXR: Pleural edema on my independent review interpretation radiologist agrees but also states cardiomegaly with vascular congestion and interstitial edema.     PROCEDURES:  Critical Care performed: No  Procedures   MEDICATIONS ORDERED IN ED: Medications  furosemide  (LASIX ) injection 20 mg (20 mg Intravenous Given 02/25/24 1840)     IMPRESSION / MDM / ASSESSMENT AND PLAN / ED COURSE                                Differential diagnosis includes, but is not limited to, CHF exacerbation, pneumonia, PE, electrolyte derangement anemia   ED course: Patient presents and EKG demonstrates no acute ischemia.  He is not hypoxic however does have an increased respiratory rate on my examination.  His pulmonary exam is most consistent with a CHF exacerbation and his BNP is elevated (albeit his baseline is always elevated).  I do think this is the most likely cause of his symptoms.  He had no profound electrolyte derangements denies any chest pain.  Chest x-ray: Without evidence of pneumonia and consistent with CHF.  A triggering factor for this may be his recent change in spironolactone .  I did send a D-dimer given his worsening shortness of breath and this is pending however patient is not hypoxic and hemodynamically stable and I think is still floor appropriate.  I will discuss admission with the hospitalist today and I have ordered a small amount of Lasix    Clinical Course as of 02/25/24 1918  Wed Feb 25, 2024  1754 B Natriuretic Peptide(!): 2,345.4 [HD]  1918 D-Dimer, Quant(!): 0.72 D-dimer does age-adjusted for VTE will hold off on  obtaining a CTA [HD]    Clinical Course User Index [HD] Nicholaus Rolland BRAVO, MD   -- Risk: 5 This patient has a high risk of morbidity due to further diagnostic testing or treatment. Rationale: This patient's evaluation and management involve a high risk of morbidity due to the potential severity of presenting symptoms, need for diagnostic testing, and/or initiation of treatment that may require close monitoring. The differential includes conditions with potential for significant deterioration or requiring escalation of care. Treatment decisions in the ED, including medication administration, procedural interventions, or disposition planning,  reflect this level of risk. COPA: 5 The patient has the following acute or chronic illness/injury that poses a possible threat to life or bodily function: [X] : The patient has a potentially serious acute condition or an acute exacerbation of a chronic illness requiring urgent evaluation and management in the Emergency Department. The clinical presentation necessitates immediate consideration of life-threatening or function-threatening diagnoses, even if they are ultimately ruled out.   FINAL CLINICAL IMPRESSION(S) / ED DIAGNOSES   Final diagnoses:  SOB (shortness of breath)  Congestive heart failure, unspecified HF chronicity, unspecified heart failure type (HCC)     Rx / DC Orders   ED Discharge Orders     None        Note:  This document was prepared using Dragon voice recognition software and may include unintentional dictation errors.

## 2024-02-26 DIAGNOSIS — I509 Heart failure, unspecified: Secondary | ICD-10-CM | POA: Diagnosis not present

## 2024-02-26 DIAGNOSIS — E785 Hyperlipidemia, unspecified: Secondary | ICD-10-CM | POA: Diagnosis present

## 2024-02-26 DIAGNOSIS — I255 Ischemic cardiomyopathy: Secondary | ICD-10-CM | POA: Diagnosis present

## 2024-02-26 DIAGNOSIS — I10 Essential (primary) hypertension: Secondary | ICD-10-CM

## 2024-02-26 DIAGNOSIS — Z792 Long term (current) use of antibiotics: Secondary | ICD-10-CM

## 2024-02-26 DIAGNOSIS — T148XXD Other injury of unspecified body region, subsequent encounter: Secondary | ICD-10-CM | POA: Diagnosis not present

## 2024-02-26 DIAGNOSIS — Z89511 Acquired absence of right leg below knee: Secondary | ICD-10-CM

## 2024-02-26 DIAGNOSIS — Z89611 Acquired absence of right leg above knee: Secondary | ICD-10-CM | POA: Diagnosis not present

## 2024-02-26 DIAGNOSIS — N400 Enlarged prostate without lower urinary tract symptoms: Secondary | ICD-10-CM | POA: Diagnosis present

## 2024-02-26 DIAGNOSIS — I739 Peripheral vascular disease, unspecified: Secondary | ICD-10-CM

## 2024-02-26 DIAGNOSIS — I5023 Acute on chronic systolic (congestive) heart failure: Secondary | ICD-10-CM | POA: Diagnosis present

## 2024-02-26 DIAGNOSIS — Z95820 Peripheral vascular angioplasty status with implants and grafts: Secondary | ICD-10-CM

## 2024-02-26 DIAGNOSIS — F1011 Alcohol abuse, in remission: Secondary | ICD-10-CM

## 2024-02-26 DIAGNOSIS — Z955 Presence of coronary angioplasty implant and graft: Secondary | ICD-10-CM | POA: Diagnosis not present

## 2024-02-26 DIAGNOSIS — T8131XA Disruption of external operation (surgical) wound, not elsewhere classified, initial encounter: Secondary | ICD-10-CM

## 2024-02-26 DIAGNOSIS — Y848 Other medical procedures as the cause of abnormal reaction of the patient, or of later complication, without mention of misadventure at the time of the procedure: Secondary | ICD-10-CM | POA: Diagnosis present

## 2024-02-26 DIAGNOSIS — I2510 Atherosclerotic heart disease of native coronary artery without angina pectoris: Secondary | ICD-10-CM | POA: Diagnosis present

## 2024-02-26 DIAGNOSIS — R0602 Shortness of breath: Secondary | ICD-10-CM | POA: Diagnosis present

## 2024-02-26 DIAGNOSIS — Z66 Do not resuscitate: Secondary | ICD-10-CM | POA: Diagnosis present

## 2024-02-26 DIAGNOSIS — I5043 Acute on chronic combined systolic (congestive) and diastolic (congestive) heart failure: Secondary | ICD-10-CM | POA: Diagnosis not present

## 2024-02-26 DIAGNOSIS — G4733 Obstructive sleep apnea (adult) (pediatric): Secondary | ICD-10-CM | POA: Diagnosis present

## 2024-02-26 DIAGNOSIS — M109 Gout, unspecified: Secondary | ICD-10-CM | POA: Diagnosis present

## 2024-02-26 DIAGNOSIS — Z7982 Long term (current) use of aspirin: Secondary | ICD-10-CM | POA: Diagnosis not present

## 2024-02-26 DIAGNOSIS — I25118 Atherosclerotic heart disease of native coronary artery with other forms of angina pectoris: Secondary | ICD-10-CM | POA: Diagnosis not present

## 2024-02-26 DIAGNOSIS — E860 Dehydration: Secondary | ICD-10-CM | POA: Diagnosis present

## 2024-02-26 DIAGNOSIS — Z794 Long term (current) use of insulin: Secondary | ICD-10-CM | POA: Diagnosis not present

## 2024-02-26 DIAGNOSIS — E782 Mixed hyperlipidemia: Secondary | ICD-10-CM | POA: Diagnosis not present

## 2024-02-26 DIAGNOSIS — Z951 Presence of aortocoronary bypass graft: Secondary | ICD-10-CM | POA: Diagnosis not present

## 2024-02-26 DIAGNOSIS — E1142 Type 2 diabetes mellitus with diabetic polyneuropathy: Secondary | ICD-10-CM | POA: Diagnosis present

## 2024-02-26 DIAGNOSIS — I272 Pulmonary hypertension, unspecified: Secondary | ICD-10-CM | POA: Diagnosis present

## 2024-02-26 DIAGNOSIS — E1151 Type 2 diabetes mellitus with diabetic peripheral angiopathy without gangrene: Secondary | ICD-10-CM | POA: Diagnosis present

## 2024-02-26 DIAGNOSIS — Z7902 Long term (current) use of antithrombotics/antiplatelets: Secondary | ICD-10-CM

## 2024-02-26 DIAGNOSIS — I110 Hypertensive heart disease with heart failure: Secondary | ICD-10-CM | POA: Diagnosis present

## 2024-02-26 DIAGNOSIS — Z79899 Other long term (current) drug therapy: Secondary | ICD-10-CM | POA: Diagnosis not present

## 2024-02-26 DIAGNOSIS — Z9889 Other specified postprocedural states: Secondary | ICD-10-CM

## 2024-02-26 DIAGNOSIS — Z8249 Family history of ischemic heart disease and other diseases of the circulatory system: Secondary | ICD-10-CM | POA: Diagnosis not present

## 2024-02-26 DIAGNOSIS — I252 Old myocardial infarction: Secondary | ICD-10-CM

## 2024-02-26 DIAGNOSIS — J9601 Acute respiratory failure with hypoxia: Secondary | ICD-10-CM | POA: Diagnosis present

## 2024-02-26 LAB — BASIC METABOLIC PANEL WITH GFR
Anion gap: 10 (ref 5–15)
BUN: 23 mg/dL (ref 8–23)
CO2: 25 mmol/L (ref 22–32)
Calcium: 8.5 mg/dL — ABNORMAL LOW (ref 8.9–10.3)
Chloride: 102 mmol/L (ref 98–111)
Creatinine, Ser: 0.95 mg/dL (ref 0.61–1.24)
GFR, Estimated: >60 mL/min (ref >60)
Glucose, Bld: 145 mg/dL — ABNORMAL HIGH (ref 70–99)
Potassium: 3.7 mmol/L (ref 3.5–5.1)
Sodium: 137 mmol/L (ref 135–145)

## 2024-02-26 LAB — CBC
HCT: 31.8 % — ABNORMAL LOW (ref 39.0–52.0)
Hemoglobin: 10.1 g/dL — ABNORMAL LOW (ref 13.0–17.0)
MCH: 28.3 pg (ref 26.0–34.0)
MCHC: 31.8 g/dL (ref 30.0–36.0)
MCV: 89.1 fL (ref 80.0–100.0)
Platelets: 251 K/uL (ref 150–400)
RBC: 3.57 MIL/uL — ABNORMAL LOW (ref 4.22–5.81)
RDW: 14.7 % (ref 11.5–15.5)
WBC: 5.9 K/uL (ref 4.0–10.5)
nRBC: 0 % (ref 0.0–0.2)

## 2024-02-26 LAB — GLUCOSE, CAPILLARY
Glucose-Capillary: 141 mg/dL — ABNORMAL HIGH (ref 70–99)
Glucose-Capillary: 152 mg/dL — ABNORMAL HIGH (ref 70–99)
Glucose-Capillary: 163 mg/dL — ABNORMAL HIGH (ref 70–99)
Glucose-Capillary: 166 mg/dL — ABNORMAL HIGH (ref 70–99)

## 2024-02-26 MED ORDER — SULFAMETHOXAZOLE-TRIMETHOPRIM 800-160 MG PO TABS
1.0000 | ORAL_TABLET | Freq: Two times a day (BID) | ORAL | Status: DC
  Administered 2024-02-26: 1 via ORAL
  Filled 2024-02-26 (×2): qty 1

## 2024-02-26 MED ORDER — KETOROLAC TROMETHAMINE 15 MG/ML IJ SOLN: 15.0000 mg | Freq: Four times a day (QID) | INTRAMUSCULAR | Status: DC | PRN

## 2024-02-26 MED ORDER — SACUBITRIL-VALSARTAN 24-26 MG PO TABS
1.0000 | ORAL_TABLET | Freq: Two times a day (BID) | ORAL | Status: DC
  Administered 2024-02-26 – 2024-02-28 (×5): 1 via ORAL
  Filled 2024-02-26 (×5): qty 1

## 2024-02-26 MED ORDER — DAPAGLIFLOZIN PROPANEDIOL 10 MG PO TABS
10.0000 mg | ORAL_TABLET | Freq: Every day | ORAL | Status: DC
  Administered 2024-02-26 – 2024-02-28 (×3): 10 mg via ORAL
  Filled 2024-02-26 (×3): qty 1

## 2024-02-26 MED ORDER — MORPHINE SULFATE (PF) 2 MG/ML IV SOLN: 2.0000 mg | INTRAVENOUS | Status: DC | PRN

## 2024-02-26 MED ORDER — POLYETHYLENE GLYCOL 3350 17 G PO PACK
17.0000 g | PACK | Freq: Once | ORAL | Status: AC
  Administered 2024-02-26: 17 g via ORAL
  Filled 2024-02-26: qty 1

## 2024-02-26 MED ORDER — LORATADINE 10 MG PO TABS
10.0000 mg | ORAL_TABLET | Freq: Every day | ORAL | Status: DC | PRN
  Administered 2024-02-26: 10 mg via ORAL
  Filled 2024-02-26: qty 1

## 2024-02-26 MED ORDER — ALUM & MAG HYDROXIDE-SIMETH 200-200-20 MG/5ML PO SUSP
30.0000 mL | ORAL | Status: DC | PRN
  Administered 2024-02-26: 30 mL via ORAL
  Filled 2024-02-26: qty 30

## 2024-02-26 MED ORDER — FUROSEMIDE 10 MG/ML IJ SOLN
60.0000 mg | Freq: Two times a day (BID) | INTRAMUSCULAR | Status: DC
  Administered 2024-02-26 – 2024-02-27 (×4): 60 mg via INTRAVENOUS
  Filled 2024-02-26 (×4): qty 6

## 2024-02-26 MED ORDER — OXYCODONE HCL 5 MG PO TABS
5.0000 mg | ORAL_TABLET | ORAL | Status: DC | PRN
  Administered 2024-02-26 – 2024-02-27 (×5): 5 mg via ORAL
  Filled 2024-02-26 (×5): qty 1

## 2024-02-26 NOTE — Progress Notes (Incomplete)
 Heart Failure Stewardship Pharmacy Note  PCP: [name], [name], MD PCP-Cardiologist: None  HPI: Manuel [name] is a 78 y.o. male with CAD, PAD s/p right BKA, gout, hyperlipidemia, hypertension, depression, anxiety, insomnia who presented with ***.   Pertinent cardiac history: CAD s/p PCI to [facility] with overlapping stents in 2011. extensive PAD s/p PTA L common Iliac, R peroneal, R distal SFA/prox popliteal, and stent to R distal SFA/prox popliteal arteries (05/14/21). Echo in 10/2022 with LVEF of 25-30%. Echo 06/2023 with LVEF 25-30%, moderately dilated LV, and mild valvular disease. LHC 06/2023 where 3 DES were placed to the LAD. Femoral artery stent placed 11/2023 after traumatic fall. Echo 11/2023 showed LVEF of 20-25% with G1DD, and mild MR.   Pertinent Lab Values: Creatinine  Date Value Ref Range Status  02/16/2012 0.72 0.60 - 1.30 mg/dL Final   Creatinine, Ser  Date Value Ref Range Status  02/26/2024 0.95 0.61 - 1.24 mg/dL Final   BUN  Date Value Ref Range Status  02/26/2024 23 8 - 23 mg/dL Final  89/69/7974 21 8 - 27 mg/dL Final  89/72/7986 10 7 - 18 mg/dL Final   Potassium  Date Value Ref Range Status  02/26/2024 3.7 3.5 - 5.1 mmol/L Final  02/16/2012 3.8 3.5 - 5.1 mmol/L Final   Sodium  Date Value Ref Range Status  02/26/2024 137 135 - 145 mmol/L Final  02/19/2024 131 (L) 134 - 144 mmol/L Final  02/16/2012 137 136 - 145 mmol/L Final   B Natriuretic Peptide  Date Value Ref Range Status  02/25/2024 2,345.4 (H) 0.0 - 100.0 pg/mL Final    Comment:    Performed at [facility], [address] [place], [place] [address]   Magnesium   Date Value Ref Range Status  02/25/2024 2.2 1.7 - 2.4 mg/dL Final    Comment:    Performed at [facility], [address] [place], [place] [address]   Hemoglobin A1C  Date Value Ref Range Status  02/04/2024 6.4 (A) 4.0 - 5.6 % Final   Hgb A1c MFr Bld  Date Value Ref Range Status  07/10/2023 8.0 (H) 4.8 - 5.6 %  Final    Comment:    (NOTE) Pre diabetes:          5.7%-6.4%  Diabetes:              >6.4%  Glycemic control for   <7.0% adults with diabetes    TSH  Date Value Ref Range Status  04/19/2023 1.416 0.350 - 4.500 uIU/mL Final    Comment:    Performed by a 3rd Generation assay with a functional sensitivity of <=0.01 uIU/mL. Performed at [facility], [address] [place], [place] [address]     Vital Signs: Admission weight: Temp:  [97.9 F (36.6 C)-98 F (36.7 C)] 97.9 F (36.6 C) (11/06 0018) Pulse Rate:  [60-76] 60 (11/06 0406) Cardiac Rhythm: Normal sinus rhythm (11/06 0030) Resp:  [18-20] 18 (11/06 0406) BP: (136-163)/(73-92) 136/73 (11/06 0406) SpO2:  [95 %-100 %] 98 % (11/06 0406) Weight:  [83.6 kg (184 lb 4.9 oz)-83.9 kg (185 lb)] 83.6 kg (184 lb 4.9 oz) (11/06 0530)  Intake/Output Summary (Last 24 hours) at 02/26/2024 0639 Last data filed at 02/26/2024 0030 Gross per 24 hour  Intake 240 ml  Output 600 ml  Net -360 ml    Current Heart Failure Medications:  Loop diuretic: furosemide  40 mg IV BID Beta-Blocker: carvedilol  3.125 mg BID ACEI/ARB/ARNI: Entresto  24-26 mg BID MRA: spironolactone   25 mg daily SGLT2i: Farxiga  10 mg daily Other: none  Prior to admission Heart Failure Medications:  Loop diuretic: furosemide  20 mg daily Beta-Blocker: carvedilol  3.125 mg BID ACEI/ARB/ARNI: Entresto  24-26 mg BID MRA: spironolactone  25 mg daily SGLT2i: Farxiga  10 mg daily Other: none  Assessment: 1. 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 NYHA class III symptoms.  -Symptoms: -Volume: -Hemodynamics: -BB: -ACEI/ARB/ARNI: -MRA: -SGLT2i:  Plan: 1) Medication changes recommended at this time:  2) Patient assistance:   3) Education: -To be completed prior to discharge.  *** Medication Assistance / Insurance Benefits Check: Does the patient have prescription insurance?    Type of  insurance plan:  Does the patient qualify for medication assistance through manufacturers or grants? {CHL AMB Yes/No/Pending:[id number]}  Eligible grants and/or patient assistance programs: ***  Medication assistance applications in progress: ***  Medication assistance applications approved: *** Approved medication assistance renewals will be completed by: ***  Outpatient Pharmacy: Prior to admission outpatient pharmacy: ***      ***

## 2024-02-26 NOTE — Consult Note (Addendum)
 WOC Nurse Consult Note: Reason for Consult: Consult requested to provide topical treatment recommendations for right groin. Performed remotely after review of progress notes and photos in the EMR.  Pt is familiar to [facility] team from a previous admission last month and he had a Vac at that time.  Right groin with full thickness post-op wound; beefy red and moist and shallow, with deeper track in the center draining thick tan pus. Pt is on systemic antibiotics for the infection.    Vac is not appropriate since the pus would clog the track pad.     Dressing procedure/placement/frequency: Topical treatment orders provided for bedside nurses to perform as follows to provide antimicrobial benefits and absorb drainage: Apply a piece of Aquacel Soila # [phone]) to right groin Q day, then cover with foam dressing.  Change foam dressing Q 3 days or PRN soiling.  Vascular consult is pending; please refer to their team for further plan of care.   Post note 1000:  assessed wound in person per physician request.  Wound is 95% beefy red, 5% yellow, mod amt tan drainage with no odor, 4.5X2X.8 in the center when swab was inserted, the rest of the wound is .2cm which is too shallow for a Vac dressing to be required. Continue present plan of care as outlined above.  Discussed with primary team.   Please re-consult if further assistance is needed.  Thank-you,  Stephane Fought MSN, RN, CWOCN, CWCN-AP, CNS Contact Mon-Fri 0700-1500: [phone]

## 2024-02-26 NOTE — Progress Notes (Signed)
 Pharmacy Antibiotic Note  Manuel [name] is a 78 y.o. male admitted on 02/25/2024 with wound infection.  Pharmacy has been consulted for Septra  dosing.   Pt grew out Pseudomonas with meropenem resistance on 10/15;  MD still wants to continue Septra .   Plan: Septra  DS (800 mg/160 mg) one tablet PO BID to start 11/6 @ ~ 0130.  - ID Consult to follow   Height: 5' 8 (172.7 cm) Weight: 83.9 kg (185 lb) IBW/kg (Calculated) : 68.4  Temp (24hrs), Avg:98 F (36.7 C), Min:97.9 F (36.6 C), Max:98 F (36.7 C)  Recent Labs  Lab 02/19/24 1522 02/25/24 1641  WBC 7.3 7.1  CREATININE 0.87 1.01    Estimated Creatinine Clearance: 61.6 mL/min (by C-G formula based on SCr of 1.01 mg/dL).    No Known Allergies  Antimicrobials this admission:   >>    >>   Dose adjustments this admission:   Microbiology results:  BCx:   UCx:    Sputum:    MRSA PCR:   Thank you for allowing pharmacy to be a part of this patient's care.  Jacquelyn Antony D 02/26/2024 1:27 AM

## 2024-02-26 NOTE — Progress Notes (Signed)
Pt stated that he does not want to wear CPAP tonight.

## 2024-02-26 NOTE — Plan of Care (Signed)

## 2024-02-26 NOTE — Progress Notes (Signed)
 Heart Failure Navigator Progress Note Current patient of the Advanced Heart Failure Team. Hospital follow-up scheduled for 03/08/24 @ 9:30 @ the CHF clinic.  Appointment details added to his discharge AVS.    Navigator will sign off at this time.  Charmaine Pines, RN, BSN [facility] Heart Failure Navigator Secure Chat Only

## 2024-02-26 NOTE — Consult Note (Addendum)
 Cardiology Consultation   Patient ID: MONTERRIO GERST MRN: [id number]; DOB: [birth year]  Admit date: 02/25/2024 Date of Consult: 02/26/2024  PCP:  [name], [name], MD   [facility] Providers Cardiologist:  None     Patient Profile: Manuel [name] is a 78 y.o. 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 Kandis.  History of Present Illness: Mr. Barriere was previously followed by [facility] cardiology establish care with our office during hospital admission in 06/2023.  He was diagnosed with CAD in 2011 which at that time he required 3 drug-eluting stents to the RCA.  He was historically followed by [facility] cardiology and [name] advanced heart failure clinic in the setting of progressive worsening LV dysfunction.  In 10/2018 echocardiogram showed LVEF of 40 to 45%.  This was followed by stress test which showed inferior infarct with minimal peri-infarct ischemia.  He was medically managed.  He was subsequently admitted 10/2022 in the setting of critical limb ischemia with admission complicated by dyspnea and heart failure.  Echo showed a drop of EF to 25 to 30%.  He was evaluated by cardio cardiology at the time which recommended ongoing GDMT with diuresis and otherwise conservative management.  He was readmitted 12/2022 with volume overload and demand ischemia and was again treated conservatively.  He was readmitted 03/2023 with altered mental status, sepsis, and osteomyelitis with chronic right BKA.  Postop course was complicated by right knee septic/gouty arthritis requiring I&D.  He was admitted 06/2023 with progressive shortness of breath  that was present for at least a year with associated orthopnea, lower leg swelling, abdominal distention.  He was initially consulted by [facility] cardiology, they requested a second opinion and care was transferred to [facility] health cardiology.  Echo showed persistent cardiomyopathy with an EF of 20 to 25%.  Initial peak high sensitivity troponin at 1045.  Given symptoms persisted and downtrending cardiomyopathy and elevated troponin he underwent right and left heart cath 07/10/2023 that showed severe three-vessel CADsuccessful PCI/DES with 3 overlapping DES to the LAD.  It was felt the patient was not a candidate for CABG.  Remaining Details included occluded proximal RCA with left to right collaterals, 95% left circumflex stenosis, 70% ramus intermedius stenosis, 30% distal left main stenosis, and 99% D1 stenosis.   He was hospitalized at [facility] 8/14 and 01/01/2024 presenting after mechanical fall and right femoral artery rupture with large femoral hematoma with active extravasation on CTA. Vascular surgery consulted. He was to be discharged with a wound VAC with follow-up OP with vascular. During hospitalization he required  3 units of PRBCs. He was found to have severe sepsis treated with IV abx. He was sent to TCU with a PICC line to complete his treatment his last day of antibiotics was going to be 01/22/24. PT/OT eval recommended SNF at d/c.   He was hospitalized at [facility] 01/16/24 after recent discharge to SNF with wound VAC. He was sent to the vascular clinic for wound VAC change due to delayed wound healing and difficulty maintaining wound VAC seal at SNF. In the ER labs were unremarkable. Vascular surgery was consulted on arrival. He was started on IV abx and wound VAC  was changed. ID was consulted and recultured wound 10/2. ID cleared patient for d/c with Bactrim  for additional 7 days. Patient was cleared for d/c 10/1, but he stayed until 10/6 due to pending home health wound VAC arrangements.   He was seen  02/02/14 reporting dizziness found to be severely hypotensive. GDMT Was held and he was sent to the ER. He went to the ER and was given IVF with improvement of pressures. He saw PCP 10/30 reporting SOB and was started on lasix .   The patient presented to the ER at [facility] 11/5 with shortness of breath. He reported 2 weeks of progressive SOB. Symptoms were initially with exertion but then he became breathless when laying flat. He was taking lasix  daily, but it was not helping. He was on doxy for sinus infection. He reported medication compliance. No chest pain reported.   In the ER 163/92, pulse 69bpm, RR 20, afebrile, 100% on RA. Labs showed Hgb 11.3. ddimer 0.72. BNP 2345. HS trop 15. Mag wnl.  CXR showed cardiomegaly, vascular congestion and interstitial edema. He was given IV lasix  and admitted for further work-up.   Past Medical History:  Diagnosis Date   Acute ST elevation myocardial infarction (STEMI) of inferior wall (HCC) 04/19/2010   a.) transfered from [facility] to [facility] --> LHC/PCI (very difficult procedure) --> 3.0 x 23 mm and 3.0 x 12 mm Xience stents to RCA   Allergies    Arthritis    Benign essential hypertension    Bilateral carotid artery disease 05/08/2021   a.) carotid doppler 05/08/2021: 1-39% BICA   CAD (coronary artery disease) 04/19/2010   a.) inferior STEMI 04/19/2010 --> LHC/PCI: 50-70% pD1, 80% pRI, 90/90/90% RCA (overlapping 3.0 x 23 and 3.0 x 12 mm Xience DES); b.) MV 11/10/2018: fixed minimally reversible inferior perfusion defect   Cellulitis of foot    Chronic HFrEF (heart failure with reduced ejection fraction) (HCC)    DDD (degenerative disc disease), cervical    Diabetes mellitus type 2, insulin  dependent (HCC)    Diverticulosis    Full dentures    Gout    Hard of hearing    History of bilateral cataract extraction 2022   History of ETOH abuse    Hyperlipidemia    Ischemic cardiomyopathy 04/19/2010   a.) TTE 04/19/2010: 40%; b.) TTE 04/20/2014: EF >55%; c.) TTE  11/10/2018: EF 45%; d.) TTE 11/14/2022: EF 25-30%; e. 06/2023 Echo: EF 25-30%, nl RV size/fxn. Mild MR. Mild-mod TR. Ao sclerosis w/o stenosis.   Long term current use of aspirin     Long term current use of clopidogrel     Lumbar degenerative disc disease    Lumbar radiculopathy    Lumbar vertebral fracture (chronic superior endplate of L1)    NSTEMI (non-ST elevated myocardial infarction) (HCC) 01/15/2023   OSA (obstructive sleep apnea)    a.) unable to tolerate nocturnal PAP therapy   Peripheral artery disease    a.) stenting 05/14/21: 12 mm x 12 cm LifeStent RIGHT dis SFA/prox pop; b.) s/p cath directed thrombolysis RIGHT SFA/pop 06/14/21; c.) s/p mech thrombectomy + stenting 06/15/21: 8 mm x 25cm & 8 mm x 7.5cm Viabahn; d.) s/p BILAT CFA, profunda femoris, SFA endarterectomies + fogarty embolectomy + stenting 11/15/22: 12mm x 58mm Lifestream BILAT CIAs, 14 mm x 6 cm Lifestream & 13 mm x 5 cm Viabahn LEFT EIA   Peripheral neuropathy    Umbilical hernia     Past Surgical History:  Procedure Laterality Date   AMPUTATION Right 11/18/2022  Procedure: AMPUTATION 4TH AND 5TH RAY;  Surgeon: [name], [name];  Location: [facility];  Service: Orthopedics/Podiatry;  Laterality: Right;  4th and 5th toe   AMPUTATION Right 04/20/2023   Procedure: AMPUTATION BELOW KNEE;  Surgeon: [name], MD;  Location: [facility];  Service: Vascular;  Laterality: Right;  block then general   APPLICATION OF WOUND VAC Right 01/09/2023   Procedure: APPLICATION OF WOUND VAC;  Surgeon: [name], MD;  Location: [facility];  Service: Vascular;  Laterality: Right;   APPLICATION OF WOUND VAC Right 12/12/2023   Procedure: APPLICATION, WOUND VAC;  Surgeon: [name], MD;  Location: [facility];  Service: Vascular;  Laterality: Right;   APPLICATION OF WOUND VAC Right 12/17/2023   Procedure: APPLICATION, WOUND VAC;  Surgeon: [name], MD;  Location: [facility];  Service: Vascular;  Laterality: Right;  EXCHANGE   APPLICATION  OF WOUND VAC Right 12/23/2023   Procedure: APPLICATION, WOUND VAC;  Surgeon: [name], MD;  Location: [facility];  Service: General;  Laterality: Right;   APPLICATION OF WOUND VAC Right 12/11/2023   Procedure: APPLICATION, WOUND VAC;  Surgeon: [name], MD;  Location: [facility];  Service: Vascular;  Laterality: Right;   CATARACT EXTRACTION W/PHACO Right 03/14/2021   Procedure: CATARACT EXTRACTION PHACO AND INTRAOCULAR LENS PLACEMENT (IOC) RIGHT DIABETIC;  Surgeon: [name], MD;  Location: [facility];  Service: Ophthalmology;  Laterality: Right;  Diabetic 16.78 01:39.9   CATARACT EXTRACTION W/PHACO Left 03/28/2021   Procedure: CATARACT EXTRACTION PHACO AND INTRAOCULAR LENS PLACEMENT (IOC) LEFT DIABETIC 6.93 01:22.0;  Surgeon: [name], MD;  Location: [facility];  Service: Ophthalmology;  Laterality: Left;  Diabetic   COLONOSCOPY     CORONARY ANGIOPLASTY WITH STENT PLACEMENT  03/2010   Procedure: CORONARY ANGIOPLASTY WITH STENT PLACEMENT; Location: [name]   CORONARY STENT INTERVENTION N/A 07/14/2023   Procedure: CORONARY STENT INTERVENTION;  Surgeon: [name], MD;  Location: [facility];  Service: Cardiovascular;  Laterality: N/A;   ENDARTERECTOMY FEMORAL Bilateral 11/15/2022   Procedure: BILATERAL COMMON FEMORAL PROFUNDA FEMORIS AND SUPERFICIAL FEMORAL ARTERY ENDARTECTOMIES, RIGHT FOGARTY EMBOLECTOMY OF THE RIGHT SFA  AND  POPLITEAL ARTERIES. EZELLA AND RIGHT LOWER EXTREMITY ANGIOGRAM.;  Surgeon: [name], MD;  Location: [facility];  Service: Vascular;  Laterality: Bilateral;   ENDARTERECTOMY FEMORAL Right 12/11/2023   Procedure: ENDARTERECTOMY, FEMORAL;  Surgeon: [name], MD;  Location: [facility];  Service: Vascular;  Laterality: Right;  Re-do of femoral endarterectomy with sartorius flap   FEMORAL ARTERY EXPLORATION Right 12/12/2023   Procedure: EXPLORATION, ARTERY, FEMORAL- stop bleeding with suture repair  anastomaosis , abx beads;  Surgeon: [name], MD;  Location: [facility];  Service: Vascular;  Laterality: Right;   GROIN DEBRIDEMENT Right 12/28/2023   Procedure: DEBRIDEMENT, INGUINAL REGION;  Surgeon: Elnor Norleen CROME, MD;  Location: [facility];  Service: Vascular;  Laterality: Right;   HEMATOMA EVACUATION N/A 12/12/2023   Procedure: EVACUATION HEMATOMA;  Surgeon: [name], MD;  Location: [facility];  Service: Vascular;  Laterality: N/A;   INSERTION OF ILIAC STENT Bilateral 11/15/2022   Procedure: BILATERAL STENT INSERTION IN BILATERAL  COMMON ILIAC ARTERY, STENT INSERTION OF LEFT EXTERNAL ILIAC ARTERY. ANGIOPLASTY RIGHT TIBIAL  AND POPLITEAL ARTERY.;  Surgeon: [name], MD;  Location: [facility];  Service: Vascular;  Laterality: Bilateral;   IRRIGATION AND DEBRIDEMENT KNEE Right 04/21/2023   Procedure: IRRIGATION AND DEBRIDEMENT KNEE;  Surgeon: Zafonte, Brian Kameran, MD;  Location: [facility];  Service: Orthopedics;  Laterality: Right;   LOWER EXTREMITY ANGIOGRAPHY Right 05/14/2021   Procedure: LOWER EXTREMITY ANGIOGRAPHY;  Surgeon: [name], MD;  Location: [facility];  Service: Cardiovascular;  Laterality: Right;   LOWER EXTREMITY ANGIOGRAPHY Right 06/14/2021   Procedure: Lower Extremity Angiography;  Surgeon: [name], MD;  Location: [facility];  Service: Cardiovascular;  Laterality: Right;   LOWER EXTREMITY ANGIOGRAPHY Right 11/11/2022   Procedure: Lower Extremity Angiography;  Surgeon: [name], MD;  Location: [facility];  Service: Cardiovascular;  Laterality: Right;   LOWER EXTREMITY ANGIOGRAPHY Right 12/04/2023   Procedure: Lower Extremity Angiography;  Surgeon: [name], MD;  Location: [facility];  Service: Cardiovascular;  Laterality: Right;   LOWER EXTREMITY INTERVENTION Right 06/15/2021   Procedure: LOWER EXTREMITY INTERVENTION;  Surgeon: [name], MD;  Location: [facility];  Service: Cardiovascular;  Laterality:  Right;   RIGHT/LEFT HEART CATH AND CORONARY ANGIOGRAPHY N/A 07/14/2023   Procedure: RIGHT/LEFT HEART CATH AND CORONARY ANGIOGRAPHY;  Surgeon: [name], MD;  Location: [facility];  Service: Cardiovascular;  Laterality: N/A;   TONSILLECTOMY     TRANSMETATARSAL AMPUTATION Right 02/17/2023   Procedure: TRANSMETATARSAL AMPUTATION;  Surgeon: Lennie Barter, [name];  Location: [facility];  Service: Orthopedics/Podiatry;  Laterality: Right;   WOUND DEBRIDEMENT Right 01/09/2023   Procedure: DEBRIDEMENT WOUND;  Surgeon: [name], MD;  Location: [facility];  Service: Vascular;  Laterality: Right;     Home Medications:  Prior to Admission medications   Medication Sig Start Date End Date Taking? Authorizing Provider  aspirin  EC 81 MG tablet Take 1 tablet (81 mg total) by mouth daily. 11/26/22  Yes Maree Hue, MD  atorvastatin  (LIPITOR ) 80 MG tablet Take 1 tablet (80 mg total) by mouth at bedtime. 11/18/23  Yes Itzael Liptak J, MD  benzonatate (TESSALON) 100 MG capsule Take 100 mg by mouth 3 (three) times daily as needed for cough. 02/25/24 03/03/24 Yes [provider]  carvedilol  (COREG ) 3.125 MG tablet Take 1 tablet (3.125 mg total) by mouth 2 (two) times daily with a meal. 02/03/24 02/02/25 Yes Hammock, Sheri, NP  clopidogrel  (PLAVIX ) 75 MG tablet Take 1 tablet (75 mg total) by mouth daily. 11/18/23  Yes Gurtaj Ruz J, MD  dapagliflozin  propanediol (FARXIGA ) 10 MG TABS tablet Take 1 tablet (10 mg total) by mouth daily. 11/18/23  Yes Lakendra Helling J, MD  doxycycline  (VIBRAMYCIN ) 100 MG capsule Take 100 mg by mouth 2 (two) times daily. 02/25/24 03/06/24 Yes [provider]  ferrous sulfate  325 (65 FE) MG EC tablet Take 325 mg by mouth daily with breakfast.   Yes [provider]  FLUoxetine (PROZAC) 20 MG capsule Take 1 capsule (20 mg total) by mouth daily. 02/04/24  Yes [name], [name], MD  fluticasone  (FLONASE ) 50 MCG/ACT nasal spray Place 1 spray into both  nostrils 2 (two) times daily as needed for allergies or rhinitis. 01/01/24  Yes Lenon Marien CROME, MD  folic acid  (FOLVITE ) 1 MG tablet Take 1 tablet (1 mg total) by mouth daily. 09/12/23  Yes [name], [name], MD  furosemide  (LASIX ) 20 MG tablet Take 1 tablet (20 mg total) by mouth daily. 02/19/24  Yes Kotturi, Vinay K, MD  gabapentin  (NEURONTIN ) 100 MG capsule Take 1 capsule (100 mg total) by mouth at bedtime. 02/04/24  Yes [name], [name], MD  insulin  aspart protamine  - aspart (NOVOLOG  70/30 MIX) (70-30) 100  UNIT/ML FlexPen Inject 3 Units into the skin 2 (two) times daily with a meal. 01/26/24  Yes Dezii, Alexandra, DO  loratadine  (CLARITIN ) 10 MG tablet Take 1 tablet (10 mg total) by mouth daily as needed for allergies. 01/01/24  Yes Lenon Marien CROME, MD  nitroGLYCERIN  (NITROSTAT ) 0.4 MG SL tablet Place 1 tablet (0.4 mg total) under the tongue every 5 (five) minutes as needed for chest pain. Max: 3 tablets per day 01/01/24 12/31/24 Yes Lenon Marien CROME, MD  pantoprazole  (PROTONIX ) 40 MG tablet Take 1 tablet (40 mg total) by mouth daily. 02/03/24  Yes Hammock, Sheri, NP  polyethylene glycol (MIRALAX  / GLYCOLAX ) 17 g packet Take 17 g by mouth daily. 01/01/24  Yes Lenon Marien CROME, MD  potassium chloride  (KLOR-CON  M) 10 MEQ tablet Take 1 tablet (10 mEq total) by mouth 2 (two) times daily for 7 days. 02/19/24 02/26/24 Yes Kotturi, Vinay K, MD  sacubitril -valsartan  (ENTRESTO ) 24-26 MG Take 1 tablet by mouth every 12 (twelve) hours. 11/18/23 11/17/24 Yes Makar Slatter J, MD  thiamine  (VITAMIN B-1) 100 MG tablet Take 1 tablet (100 mg total) by mouth daily. 02/20/23  Yes Caleen Qualia, MD  traZODone  (DESYREL ) 100 MG tablet Take 1 tablet (100 mg total) by mouth at bedtime. 02/06/24  Yes [name], [name], MD  artificial tears ophthalmic solution Place 1 drop into both eyes as needed for dry eyes. Patient not taking: Reported on 02/25/2024 12/26/23   Patel, Sona, MD  escitalopram  (LEXAPRO ) 10 MG tablet Take 1  tablet (10 mg total) by mouth daily. Patient not taking: Reported on 02/25/2024 01/29/24   [name], [name], MD    Scheduled Meds:  aspirin  EC  81 mg Oral Daily   atorvastatin   80 mg Oral QHS   carvedilol   3.125 mg Oral BID WC   clopidogrel   75 mg Oral Daily   dapagliflozin  propanediol  10 mg Oral Daily   enoxaparin  (LOVENOX ) injection  40 mg Subcutaneous Q24H   ferrous sulfate   325 mg Oral Q breakfast   FLUoxetine  20 mg Oral Daily   folic acid   1 mg Oral Daily   furosemide   40 mg Intravenous Q12H   gabapentin   100 mg Oral QHS   insulin  aspart  0-15 Units Subcutaneous TID WC   insulin  aspart  0-5 Units Subcutaneous QHS   pantoprazole   40 mg Oral Daily   polyethylene glycol  17 g Oral Daily   potassium chloride   10 mEq Oral BID   sacubitril -valsartan   1 tablet Oral Q12H   sulfamethoxazole -trimethoprim   1 tablet Oral Q12H   thiamine   100 mg Oral Daily   traZODone   100 mg Oral QHS   Continuous Infusions:  PRN Meds: acetaminophen  **OR** acetaminophen , ketorolac , morphine  injection, nitroGLYCERIN , ondansetron  **OR** ondansetron  (ZOFRAN ) IV, oxyCODONE   Allergies:   No Known Allergies  Social History:   Social History   Socioeconomic History   Marital status: Married    Spouse name: [name]   Number of children: 3   Years of education: Not on file   Highest education level: Not on file  Occupational History   Not on file  Tobacco Use   Smoking status: Never    Passive exposure: Never   Smokeless tobacco: Never  Vaping Use   Vaping status: Never Used  Substance and Sexual Activity   Alcohol  use: Yes    Alcohol /week: 21.0 standard drinks of alcohol     Types: 7 Glasses of wine, 14 Cans of beer per week  Comment: occassional   Drug use: No   Sexual activity: Not on file  Other Topics Concern   Not on file  Social History Narrative   Not on file   Social Drivers of Health   Financial Resource Strain: Low Risk  (07/11/2023)   Overall Financial Resource Strain (CARDIA)     Difficulty of Paying Living Expenses: Not hard at all  Food Insecurity: No Food Insecurity (02/26/2024)   Hunger Vital Sign    Worried About Running Out of Food in the Last Year: Never true    Ran Out of Food in the Last Year: Never true  Transportation Needs: No Transportation Needs (02/26/2024)   PRAPARE - Administrator, Civil Service (Medical): No    Lack of Transportation (Non-Medical): No  Physical Activity: Not on file  Stress: Not on file  Social Connections: Unknown (02/26/2024)   Social Connection and Isolation Panel    Frequency of Communication with Friends and Family: More than three times a week    Frequency of Social Gatherings with Friends and Family: More than three times a week    Attends Religious Services: 1 to 4 times per year    Active Member of [employer] or Organizations: Yes    Attends Banker Meetings: 1 to 4 times per year    Marital Status: Patient declined  Recent Concern: Social Connections - Moderately Isolated (01/17/2024)   Social Connection and Isolation Panel    Frequency of Communication with Friends and Family: More than three times a week    Frequency of Social Gatherings with Friends and Family: More than three times a week    Attends Religious Services: Patient declined    Database Administrator or Organizations: No    Attends Banker Meetings: Patient declined    Marital Status: Married  Catering Manager Violence: Not At Risk (02/26/2024)   Humiliation, Afraid, Rape, and Kick questionnaire    Fear of Current or Ex-Partner: No    Emotionally Abused: No    Physically Abused: No    Sexually Abused: No    Family History:    Family History  Problem Relation Age of Onset   Scoliosis Mother    Heart disease Father      ROS:  Please see the history of present illness.   All other ROS reviewed and negative.     Physical Exam/Data: Vitals:   02/26/24 0018 02/26/24 0406 02/26/24 0530 02/26/24 0804  BP: (!)  160/83 136/73  (!) 141/70  Pulse: 76 60  (!) 55  Resp: 20 18  18   Temp: 97.9 F (36.6 C)   97.7 F (36.5 C)  TempSrc:      SpO2: 95% 98%  100%  Weight: 83.6 kg  83.6 kg   Height:        Intake/Output Summary (Last 24 hours) at 02/26/2024 0819 Last data filed at 02/26/2024 0730 Gross per 24 hour  Intake 240 ml  Output 900 ml  Net -660 ml      02/26/2024    5:30 AM 02/26/2024   12:18 AM 02/25/2024    4:31 PM  Last 3 Weights  Weight (lbs) 184 lb 4.9 oz 184 lb 4.9 oz 185 lb  Weight (kg) 83.6 kg 83.6 kg 83.915 kg     Body mass index is 28.02 kg/m.  General:  Well nourished, well developed, in no acute distress HEENT: normal Neck: no JVD Vascular: No carotid bruits; Distal pulses 2+ bilaterally  Cardiac:  normal S1, S2; RRR; no murmur  Lungs:  clear to auscultation bilaterally, no wheezing, rhonchi or rales  Abd: soft, nontender, no hepatomegaly  Ext: no edema Musculoskeletal: right BKA Skin: warm and dry  Neuro:  CNs 2-12 intact, no focal abnormalities noted Psych:  Normal affect   EKG:  The EKG was personally reviewed and demonstrates:  NSR 69bpm, q waves inf leads Telemetry:  Telemetry was personally reviewed and demonstrates:  NSR HR 50s  Relevant CV Studies:  Echo 11/2023  1. Left ventricular ejection fraction, by estimation, is 20 to 25%. The  left ventricle has severely decreased function. The left ventricle  demonstrates global hypokinesis. Left ventricular diastolic parameters are  consistent with Grade I diastolic  dysfunction (impaired relaxation).   2. Right ventricular systolic function is severely reduced. The right  ventricular size is normal. There is normal pulmonary artery systolic  pressure.   3. Left atrial size was mildly dilated.   4. The mitral valve is normal in structure. Mild mitral valve  regurgitation.   5. The aortic valve is tricuspid. Aortic valve regurgitation is not  visualized. Aortic valve sclerosis/calcification is present, without  any  evidence of aortic stenosis.   6. Aortic dilatation noted. There is mild dilatation of the ascending  aorta, measuring 39 mm.   7. The inferior vena cava is dilated in size with <50% respiratory  variability, suggesting right atrial pressure of 15 mmHg.   R/LHC 07/14/2023:   Ost RCA to Prox RCA lesion is 100% stenosed.   Ramus lesion is 70% stenosed.   1st Diag lesion is 99% stenosed.   Prox Cx lesion is 95% stenosed.   Dist LM lesion is 30% stenosed.   Mid LAD lesion is 90% stenosed.   Prox LAD to Mid LAD lesion is 80% stenosed.   Ost LAD to Prox LAD lesion is 50% stenosed.   A drug-eluting stent was successfully placed using a STENT ONYX FRONTIER 2.25X38.   A drug-eluting stent was successfully placed using a STENT ONYX FRONTIER 2.5X26.   A drug-eluting stent was successfully placed using a STENT ONYX FRONTIER 2.75X15.   Post intervention, there is a 0% residual stenosis.   Post intervention, there is a 0% residual stenosis.   Post intervention, there is a 0% residual stenosis.   1.  Severe three-vessel coronary artery disease. 2.  Left ventricular angiography was not performed.  EF was severely reduced by echo. 3.  Right heart catheterization showed moderately elevated right and left-sided filling pressures, moderate to severe pulmonary hypertension and moderately reduced cardiac output. 4.  Successful angioplasty and 3 overlapped drug-eluting stent placement to the LAD.  Extremely difficult procedure due to tortuosity and calcifications.  There was a proximal edge dissection that required adding a third stent all the way back to the ostium of the LAD.  The patient had significant chest pain and hypotension with the telescope catheter in place.  He was started on nitroglycerin  drip at the end.   Recommendations: I felt that the patient was not a candidate for CABG and thus I proceeded with LAD PCI.  Will transfer the patient to stepdown ICU given chest pain postprocedure and will  use nitroglycerin  drip.  Recommend medical therapy for the rest of his coronary artery disease. Continue IV diuresis for heart failure.   2D echo 07/10/2023: 1. Left ventricular ejection fraction, by estimation, is 25 to 30%. The  left ventricle has severely decreased function. The left ventricle  demonstrates regional  wall motion abnormalities (see scoring  diagram/findings for description). The left  ventricular internal cavity size was moderately dilated. Left ventricular  diastolic parameters were normal. The global longitudinal strain is  normal.   2. Right ventricular systolic function is normal. The right ventricular  size is normal. There is normal pulmonary artery systolic pressure.   3. The mitral valve is normal in structure. Mild mitral valve  regurgitation. No evidence of mitral stenosis.   4. Tricuspid valve regurgitation is mild to moderate.   5. The aortic valve is normal in structure. Aortic valve regurgitation is  not visualized. Aortic valve sclerosis is present, with no evidence of  aortic valve stenosis.   6. The inferior vena cava is normal in size with greater than 50%  respiratory variability, suggesting right atrial pressure of 3 mmHg.    2D echo 11/14/2022: 1. Left ventricular ejection fraction, by estimation, is 25 to 30%. The  left ventricle has severely decreased function. The left ventricle  demonstrates regional wall motion abnormalities (see scoring  diagram/findings for description). Left ventricular  diastolic parameters were normal.   2. Right ventricular systolic function is normal. The right ventricular  size is normal.   3. The mitral valve is normal in structure. Mild to moderate mitral valve  regurgitation. No evidence of mitral stenosis.   4. Tricuspid valve regurgitation is mild to moderate.   5. The aortic valve is normal in structure. Aortic valve regurgitation is  not visualized. No aortic stenosis is present.   6. The inferior vena cava  is normal in size with greater than 50%  respiratory variability, suggesting right atrial pressure of 3 mmHg.    Lexiscan MPI 11/10/2018: LVEF= 45%   FINDINGS:  Regional wall motion:  demonstrates  hypokinesis of the inferior wall.  The overall quality of the study is fair.   Artifacts noted: no  Left ventricular cavity: normal.   Perfusion Analysis:  SPECT images demonstrate moderate perfusion  abnormality of moderate intensity is present in the inferior region on the  stress images.  Resting images show moderate perfusion abnormality of  moderate intensity of the inferior myocardial region with slight  improvements consistent with previous infarct and/or scar as well as with  minimal amount of myocardial ischemia and reversibility defect type :  Mixed      2D echo 11/10/2018: INTERPRETATION  MILD LV SYSTOLIC DYSFUNCTION WITH AN ESTIMATED EF = 40-45 %  NORMAL RIGHT VENTRICULAR SYSTOLIC FUNCTION  MILD-TO-MODERATE MITRAL VALVE INSUFFICIENCY  TRACE TRICUSPID VALVE INSUFFICIENCY  NO VALVULAR STENOSIS  INFERIOR WALL HYPOKINESIS    2D echo 04/20/2014: INTERPRETATION  NORMAL LEFT VENTRICULAR SYSTOLIC FUNCTION  NORMAL RIGHT VENTRICULAR SYSTOLIC FUNCTION  MILD VALVULAR REGURGITATION (See above)  NO VALVULAR STENOSIS   LHC 04/19/2010: SUMMARY:  1. Heavy calcification throughout the coronary system.  2. Ramus intermedius with proximal 80% narrowing.  3. Large first diagonal branch with a 50% to 70% proximal narrowing.  4. Heavily calcified and complex tortuous mid right coronary artery lesion    with multiple sequential 90% lesions.  This was successfully dilated and    stented using overlapping 3 mm Xience stents of 23 mm and 12 mm    postdilated to 3.4 mm.   Laboratory Data: High Sensitivity Troponin:   Recent Labs  Lab 02/03/24 1505 02/03/24 1703 02/25/24 1846 02/25/24 2045  TROPONINIHS 21* 20* 15 15     Chemistry Recent Labs  Lab 02/19/24 1522 02/25/24 1641  02/25/24 1846 02/26/24 0437  NA 131*  135  --  137  K 5.2 4.7  --  3.7  CL 97 99  --  102  CO2 18* 23  --  25  GLUCOSE 192* 198*  --  145*  BUN 21 21  --  23  CREATININE 0.87 1.01  --  0.95  CALCIUM  9.5 9.1  --  8.5*  MG  --   --  2.2  --   GFRNONAA  --  >60  --  >60  ANIONGAP  --  13  --  10    Recent Labs  Lab 02/19/24 1522  PROT 7.2  ALBUMIN 4.2  AST 20  ALT 19  ALKPHOS 139*  BILITOT 0.6   Lipids No results for input(s): CHOL, TRIG, HDL, LABVLDL, LDLCALC, CHOLHDL in the last 168 hours.  Hematology Recent Labs  Lab 02/19/24 1522 02/25/24 1641 02/26/24 0437  WBC 7.3 7.1 5.9  RBC 3.99* 4.01* 3.57*  HGB 11.5* 11.3* 10.1*  HCT 35.7* 35.1* 31.8*  MCV 90 87.5 89.1  MCH 28.8 28.2 28.3  MCHC 32.2 32.2 31.8  RDW 13.9 15.1 14.7  PLT 241 268 251   Thyroid No results for input(s): TSH, FREET4 in the last 168 hours.  BNP Recent Labs  Lab 02/25/24 1641  BNP 2,345.4*    DDimer  Recent Labs  Lab 02/25/24 1846  DDIMER 0.72*    Radiology/Studies:  DG Chest 2 View Result Date: 02/25/2024 EXAM: 2 VIEW(S) XRAY OF THE CHEST 02/25/2024 05:10:00 PM COMPARISON: 02/19/2024 CLINICAL HISTORY: Shortness of breath. FINDINGS: LUNGS AND PLEURA: No focal pulmonary opacity. Interstitial prominence likely reflects interstitial edema. Small bilateral effusions. No pneumothorax. HEART AND MEDIASTINUM: Cardiomegaly with vascular congestion. BONES AND SOFT TISSUES: No acute osseous abnormality. IMPRESSION: 1. Cardiomegaly with vascular congestion and interstitial edema. 2. Small bilateral pleural effusions. Electronically signed by: Franky Crease MD 02/25/2024 05:23 PM EST RP Workstation: [id number]     Assessment and Plan:  Acute on chronic systolic heart failure ICM -Presented with DOE, orthopnea, BNP >2000 and CXR with CHF. GDMT had been stopped due to hypotension, then he saw PCP for SOB/orthopnea who started Lasix  20mg  daily. - IV lasix  60mg  BID - PTA lasix  20mg   daily, coreg  3.173mBIG, farxiga  10mg  daily, Entresto  24/26mg BID>>GDMT held for diuresis. 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

## 2024-02-26 NOTE — TOC Initial Note (Signed)
 Transition of Care [facility]) - Initial/Assessment Note    Patient Details  Name: Manuel [name] MRN: [id number] Date of Birth: 16-May-1945  Transition of [facility] CM/SW Contact:    Lauraine JAYSON Carpen, LCSW Phone Number: 02/26/2024, 1:17 PM  Clinical Narrative:   Patient is active with [facility] for PT, OT, RN. Received call from [facility]. Patient's PCP ordered home oxygen and OLE through them. They will not need new orders at discharge. CSW will continue to follow progress and facilitate return home once stable.               Expected Discharge Plan: Home w Home Health Services Barriers to Discharge: Continued Medical Work up   Patient Goals and CMS Choice            Expected Discharge Plan and Services       Living arrangements for the past 2 months: Single Family Home                           HH Arranged: RN, PT, OT [facility] Agency: [facility] Date HH Agency Contacted: 02/26/24   Representative spoke with at [facility] Agency: Shaun  Prior Living Arrangements/Services Living arrangements for the past 2 months: Single Family Home Lives with:: Spouse Patient language and need for interpreter reviewed:: Yes        Need for Family Participation in Patient Care: Yes (Comment) Care giver support system in place?: Yes (comment) Current home services: Home OT, Home PT, Home RN Criminal Activity/Legal Involvement Pertinent to Current Situation/Hospitalization: No - Comment as needed  Activities of Daily Living      Permission Sought/Granted                  Emotional Assessment       Orientation: : Oriented to Self, Oriented to Place, Oriented to  Time, Oriented to Situation Alcohol  / Substance Use: Not Applicable Psych Involvement: No (comment)  Admission diagnosis:  SOB (shortness of breath) [R06.02] CHF exacerbation (HCC) [I50.9] Congestive heart failure, unspecified HF chronicity, unspecified heart failure type (HCC)  [I50.9] Patient Active Problem List   Diagnosis Date Noted   CHF exacerbation (HCC) 02/26/2024   Controlled diabetes mellitus type 2 with complications (HCC) 02/05/2024   Immunization due 02/05/2024   Hypotension 02/05/2024   Wound infection 01/23/2024   Delayed wound healing, right groin 01/17/2024   Wound dehiscence, right groin 01/17/2024   Long term (current) use of antibiotics 01/17/2024   NSVT (nonsustained ventricular tachycardia) (HCC) 12/20/2023   Diarrhea 12/19/2023   Gram-negative bacteremia 12/18/2023   Severe sepsis (HCC) 12/17/2023   Iron  deficiency anemia 12/17/2023   History of alcohol  abuse 12/17/2023   Sleep apnea 12/17/2023   Hyperkalemia 12/17/2023   Hemorrhagic shock (HCC) 12/12/2023   Serratia infection 12/09/2023   Vascular graft infection 12/09/2023   Ischemic cardiomyopathy 12/06/2023   Coronary artery disease involving native coronary artery of native heart 12/06/2023   Rupture of femoral artery 12/04/2023   Acute systolic heart failure (HCC) 07/12/2023   NSTEMI (non-ST elevated myocardial infarction) (HCC) 07/09/2023   Insomnia 07/09/2023   DNR (do not resuscitate)/DNI(Do Not Intubate( 05/07/2023   S/P BKA (below knee amputation), right (HCC) - 04-20-2023 05/07/2023   Current severe episode of major depressive disorder without psychotic features (HCC) 04/28/2023   AKI (acute kidney injury) 04/19/2023   Hyponatremia 04/19/2023   Uncontrolled type 2 diabetes mellitus with hyperglycemia, with long-term current use  of insulin  (HCC) 04/19/2023   Essential hypertension 04/19/2023   Dyslipidemia 04/19/2023   BPH (benign prostatic hyperplasia) 04/19/2023   Acute metabolic encephalopathy 04/19/2023   Adjustment disorder with depressed mood 11/14/2022   Acute on chronic HFrEF (heart failure with reduced ejection fraction) (HCC) 11/12/2022   ETOH abuse 06/07/2021   Coronary artery disease with history of myocardial infarction without history of CABG 05/08/2021    S/P angioplasty with stent 05/08/2021   PAD with history of prior angioplasty and stent placement (peripheral artery disease) 05/08/2021   Depression, recurrent 12/30/2017   Diabetic polyneuropathy associated with type 2 diabetes mellitus (HCC) 09/25/2017   Lumbar degenerative disc disease 01/27/2017   Insulin  dependent type 2 diabetes mellitus (HCC) 10/18/2014   Bilateral carotid artery stenosis 04/05/2014   Gout 09/21/2013   Mixed hyperlipidemia 09/14/2013   PCP:  Onita Devere POUR, MD Pharmacy:   [id number] [place], [place] - [address] AT [facility] [address] [place] [address] Phone: [phone] Fax: [phone]  [facility] [address] [place] [address] Phone: [phone] Fax: [phone]     Social Drivers of Health (SDOH) Social History: SDOH Screenings   Food Insecurity: No Food Insecurity (02/26/2024)  Housing: Low Risk  (02/26/2024)  Transportation Needs: No Transportation Needs (02/26/2024)  Utilities: Patient Declined (02/26/2024)  Depression (PHQ2-9): High Risk (02/04/2024)  Financial Resource Strain: Low Risk  (07/11/2023)  Social Connections: Unknown (02/26/2024)  Recent Concern: Social Connections - Moderately Isolated (01/17/2024)  Tobacco Use: Low Risk  (02/25/2024)  Recent Concern: Tobacco Use - Medium Risk (02/25/2024)   Received from [facility]   SDOH Interventions:     Readmission Risk Interventions    12/05/2023    3:09 PM 07/09/2023    8:30 PM 02/18/2023   11:58 AM  Readmission Risk Prevention Plan  Transportation Screening Complete Complete   PCP or Specialist Appt within 3-5 Days   Complete  HRI or Home Care Consult   Complete  Social Work Consult for Recovery Care Planning/Counseling   Complete  Palliative Care Screening   Not Applicable  Medication Review Oceanographer) Complete Complete Complete  PCP or  Specialist appointment within 3-5 days of discharge Complete    HRI or Home Care Consult  Complete   SW Recovery Care/Counseling Consult Complete Complete   Palliative Care Screening Not Applicable Not Applicable   Skilled Nursing Facility Not Applicable Not Applicable

## 2024-02-26 NOTE — Assessment & Plan Note (Addendum)
 Possible recurrent wound infection right groin  History of wound dehiscence right groin s/p femoral artery repair (12/2023) Wound VAC removed on 02/23/2024 Patient reports moisture and oozing-previously treated with Bactrim  as well as fluconazole-followed by ID Wound culture Will restart Bactrim  empirically Vascular consult--might need to be placed back on wound VAC Wound care consult Consider ID consult-last seen by ID 02/10/2024 outpatient--pictures compared, when wounds looked more improved

## 2024-02-26 NOTE — Consult Note (Signed)
 Hospital Consult    Reason for Consult:  Right Groin delayed wound healing.  Requesting Physician:  Dr Delayne Solian MD  MRN #:  [id number]  History of Present Illness: This is a 78 y.o. male well-known to vein and vascular surgery.  He presents to [facility] emergency department for shortness of breath and is being admitted for CHF exacerbation in the setting of recent medication adjustment as well as concern for infection of his right groin.  Below are listed the procedures in which the patient wanted padding along with delayed wound healing to his right groin.  He had wound VAC placement in the hospital and then at home with home health.  Wound VAC was removed as the wound bed had healed to the skin and was doing well a couple of weeks ago.  He followed up with infectious disease on 02/10/2024.  At that time photos were taken showing good healing.  Patient currently has not been on antibiotics for weeks except for doxycycline  that was started a day or so ago for his sinusitis.  PROCEDURE:12/12/2023 1.   Right groin reexploration for bleeding 2.   Evacuation of right groin hematoma 3.   Suture repair of proximal bypass anastomosis in the distal external iliac artery 4.   Replacement of the sartorius flap for coverage 5.   Placement of gentamicin  and vancomycin  antibiotic impregnated beads 6.   Negative pressure dressing placement right groin   PROCEDURE:12/11/2023 1.   Right external iliac artery to profunda femoris artery bypass with autologous ipsilateral superficial femoral artery 2.   Resection of infected right common femoral artery and removal of common femoral artery, profunda femoris artery, and superficial femoral artery stents 3.   Harvest of right superficial femoral artery for bypass with endarterectomy of right superficial femoral artery to make it usable for bypass conduit 4.   Right sartorius flap placement 5.   Negative pressure dressing placement right groin 6.   Redo groin  exploration for vascular surgery  Patient is resting comfortably in bed this morning.  Patient is concerned that the wound weeps some.  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  His leg is warm with palpable pulses his popliteal artery.  Patient endorses he still remains somewhat short of breath.  He states he has been started on Lasix  drip today.  No other complaints overnight.  Vitals all remained stable.  Past Medical History:  Diagnosis Date   Acute ST elevation myocardial infarction (STEMI) of inferior wall (HCC) 04/19/2010   a.) transfered from [facility] to [facility] --> LHC/PCI (very difficult procedure) --> 3.0 x 23 mm and 3.0 x 12 mm Xience stents to RCA   Allergies    Arthritis    Benign essential hypertension    Bilateral carotid artery disease 05/08/2021   a.) carotid doppler 05/08/2021: 1-39% BICA   CAD (coronary artery disease) 04/19/2010   a.) inferior STEMI 04/19/2010 --> LHC/PCI: 50-70% pD1, 80% pRI, 90/90/90% RCA (overlapping 3.0 x 23 and 3.0 x 12 mm Xience DES); b.) MV 11/10/2018: fixed minimally reversible inferior perfusion defect   Cellulitis of foot    Chronic HFrEF (heart failure with reduced ejection fraction) (HCC)    DDD (degenerative disc disease), cervical    Diabetes mellitus type 2, insulin  dependent (HCC)    Diverticulosis    Full dentures    Gout    Hard of hearing    History of bilateral cataract extraction 2022   History of ETOH abuse  Hyperlipidemia    Ischemic cardiomyopathy 04/19/2010   a.) TTE 04/19/2010: 40%; b.) TTE 04/20/2014: EF >55%; c.) TTE 11/10/2018: EF 45%; d.) TTE 11/14/2022: EF 25-30%; e. 06/2023 Echo: EF 25-30%, nl RV size/fxn. Mild MR. Mild-mod TR. Ao sclerosis w/o stenosis.   Long term current use of aspirin     Long term current use of clopidogrel     Lumbar degenerative disc disease    Lumbar radiculopathy    Lumbar vertebral fracture (chronic superior endplate of L1)    NSTEMI (non-ST elevated myocardial infarction) (HCC)  01/15/2023   OSA (obstructive sleep apnea)    a.) unable to tolerate nocturnal PAP therapy   Peripheral artery disease    a.) stenting 05/14/21: 12 mm x 12 cm LifeStent RIGHT dis SFA/prox pop; b.) s/p cath directed thrombolysis RIGHT SFA/pop 06/14/21; c.) s/p mech thrombectomy + stenting 06/15/21: 8 mm x 25cm & 8 mm x 7.5cm Viabahn; d.) s/p BILAT CFA, profunda femoris, SFA endarterectomies + fogarty embolectomy + stenting 11/15/22: 12mm x 58mm Lifestream BILAT CIAs, 14 mm x 6 cm Lifestream & 13 mm x 5 cm Viabahn LEFT EIA   Peripheral neuropathy    Umbilical hernia     Past Surgical History:  Procedure Laterality Date   AMPUTATION Right 11/18/2022   Procedure: AMPUTATION 4TH AND 5TH RAY;  Surgeon: [name], [name];  Location: [facility];  Service: Orthopedics/Podiatry;  Laterality: Right;  4th and 5th toe   AMPUTATION Right 04/20/2023   Procedure: AMPUTATION BELOW KNEE;  Surgeon: [name], MD;  Location: [facility];  Service: Vascular;  Laterality: Right;  block then general   APPLICATION OF WOUND VAC Right 01/09/2023   Procedure: APPLICATION OF WOUND VAC;  Surgeon: [name], MD;  Location: [facility];  Service: Vascular;  Laterality: Right;   APPLICATION OF WOUND VAC Right 12/12/2023   Procedure: APPLICATION, WOUND VAC;  Surgeon: [name], MD;  Location: [facility];  Service: Vascular;  Laterality: Right;   APPLICATION OF WOUND VAC Right 12/17/2023   Procedure: APPLICATION, WOUND VAC;  Surgeon: [name], MD;  Location: [facility];  Service: Vascular;  Laterality: Right;  EXCHANGE   APPLICATION OF WOUND VAC Right 12/23/2023   Procedure: APPLICATION, WOUND VAC;  Surgeon: [name], MD;  Location: [facility];  Service: General;  Laterality: Right;   APPLICATION OF WOUND VAC Right 12/11/2023   Procedure: APPLICATION, WOUND VAC;  Surgeon: [name], MD;  Location: [facility];  Service: Vascular;  Laterality: Right;   CATARACT EXTRACTION W/PHACO Right 03/14/2021   Procedure: CATARACT  EXTRACTION PHACO AND INTRAOCULAR LENS PLACEMENT (IOC) RIGHT DIABETIC;  Surgeon: [name], MD;  Location: [facility];  Service: Ophthalmology;  Laterality: Right;  Diabetic 16.78 01:39.9   CATARACT EXTRACTION W/PHACO Left 03/28/2021   Procedure: CATARACT EXTRACTION PHACO AND INTRAOCULAR LENS PLACEMENT (IOC) LEFT DIABETIC 6.93 01:22.0;  Surgeon: [name], MD;  Location: [facility];  Service: Ophthalmology;  Laterality: Left;  Diabetic   COLONOSCOPY     CORONARY ANGIOPLASTY WITH STENT PLACEMENT  03/2010   Procedure: CORONARY ANGIOPLASTY WITH STENT PLACEMENT; Location: [name]   CORONARY STENT INTERVENTION N/A 07/14/2023   Procedure: CORONARY STENT INTERVENTION;  Surgeon: [name], MD;  Location: [facility];  Service: Cardiovascular;  Laterality: N/A;   ENDARTERECTOMY FEMORAL Bilateral 11/15/2022   Procedure: BILATERAL COMMON FEMORAL PROFUNDA FEMORIS AND SUPERFICIAL FEMORAL ARTERY ENDARTECTOMIES, RIGHT FOGARTY EMBOLECTOMY OF THE RIGHT SFA  AND  POPLITEAL ARTERIES. AORTAGRAM AND RIGHT LOWER  EXTREMITY ANGIOGRAM.;  Surgeon: [name], MD;  Location: [facility];  Service: Vascular;  Laterality: Bilateral;   ENDARTERECTOMY FEMORAL Right 12/11/2023   Procedure: ENDARTERECTOMY, FEMORAL;  Surgeon: [name], MD;  Location: [facility];  Service: Vascular;  Laterality: Right;  Re-do of femoral endarterectomy with sartorius flap   FEMORAL ARTERY EXPLORATION Right 12/12/2023   Procedure: EXPLORATION, ARTERY, FEMORAL- stop bleeding with suture repair anastomaosis , abx beads;  Surgeon: [name], MD;  Location: [facility];  Service: Vascular;  Laterality: Right;   GROIN DEBRIDEMENT Right 12/28/2023   Procedure: DEBRIDEMENT, INGUINAL REGION;  Surgeon: Elnor Norleen CROME, MD;  Location: [facility];  Service: Vascular;  Laterality: Right;   HEMATOMA EVACUATION N/A 12/12/2023   Procedure: EVACUATION HEMATOMA;  Surgeon: [name], MD;  Location: [facility];   Service: Vascular;  Laterality: N/A;   INSERTION OF ILIAC STENT Bilateral 11/15/2022   Procedure: BILATERAL STENT INSERTION IN BILATERAL  COMMON ILIAC ARTERY, STENT INSERTION OF LEFT EXTERNAL ILIAC ARTERY. ANGIOPLASTY RIGHT TIBIAL  AND POPLITEAL ARTERY.;  Surgeon: [name], MD;  Location: [facility];  Service: Vascular;  Laterality: Bilateral;   IRRIGATION AND DEBRIDEMENT KNEE Right 04/21/2023   Procedure: IRRIGATION AND DEBRIDEMENT KNEE;  Surgeon: Zafonte, Brian Mehdi, MD;  Location: [facility];  Service: Orthopedics;  Laterality: Right;   LOWER EXTREMITY ANGIOGRAPHY Right 05/14/2021   Procedure: LOWER EXTREMITY ANGIOGRAPHY;  Surgeon: [name], MD;  Location: [facility];  Service: Cardiovascular;  Laterality: Right;   LOWER EXTREMITY ANGIOGRAPHY Right 06/14/2021   Procedure: Lower Extremity Angiography;  Surgeon: [name], MD;  Location: [facility];  Service: Cardiovascular;  Laterality: Right;   LOWER EXTREMITY ANGIOGRAPHY Right 11/11/2022   Procedure: Lower Extremity Angiography;  Surgeon: [name], MD;  Location: [facility];  Service: Cardiovascular;  Laterality: Right;   LOWER EXTREMITY ANGIOGRAPHY Right 12/04/2023   Procedure: Lower Extremity Angiography;  Surgeon: [name], MD;  Location: [facility];  Service: Cardiovascular;  Laterality: Right;   LOWER EXTREMITY INTERVENTION Right 06/15/2021   Procedure: LOWER EXTREMITY INTERVENTION;  Surgeon: [name], MD;  Location: [facility];  Service: Cardiovascular;  Laterality: Right;   RIGHT/LEFT HEART CATH AND CORONARY ANGIOGRAPHY N/A 07/14/2023   Procedure: RIGHT/LEFT HEART CATH AND CORONARY ANGIOGRAPHY;  Surgeon: [name], MD;  Location: [facility];  Service: Cardiovascular;  Laterality: N/A;   TONSILLECTOMY     TRANSMETATARSAL AMPUTATION Right 02/17/2023   Procedure: TRANSMETATARSAL AMPUTATION;  Surgeon: Lennie Barter, [name];  Location: [facility];   Service: Orthopedics/Podiatry;  Laterality: Right;   WOUND DEBRIDEMENT Right 01/09/2023   Procedure: DEBRIDEMENT WOUND;  Surgeon: [name], MD;  Location: [facility];  Service: Vascular;  Laterality: Right;    No Known Allergies  Prior to Admission medications   Medication Sig Start Date End Date Taking? Authorizing Provider  aspirin  EC 81 MG tablet Take 1 tablet (81 mg total) by mouth daily. 11/26/22  Yes Maree Hue, MD  atorvastatin  (LIPITOR ) 80 MG tablet Take 1 tablet (80 mg total) by mouth at bedtime. 11/18/23  Yes Gollan, Timothy J, MD  benzonatate (TESSALON) 100 MG capsule Take 100 mg by mouth 3 (three) times daily as needed for cough. 02/25/24 03/03/24 Yes [provider]  carvedilol  (COREG ) 3.125 MG tablet Take 1 tablet (3.125 mg total) by mouth 2 (two) times daily with a meal. 02/03/24 02/02/25 Yes Hammock, Sheri, NP  clopidogrel  (PLAVIX )  75 MG tablet Take 1 tablet (75 mg total) by mouth daily. 11/18/23  Yes Gollan, Timothy J, MD  dapagliflozin  propanediol (FARXIGA ) 10 MG TABS tablet Take 1 tablet (10 mg total) by mouth daily. 11/18/23  Yes Gollan, Timothy J, MD  doxycycline  (VIBRAMYCIN ) 100 MG capsule Take 100 mg by mouth 2 (two) times daily. 02/25/24 03/06/24 Yes [provider]  ferrous sulfate  325 (65 FE) MG EC tablet Take 325 mg by mouth daily with breakfast.   Yes [provider]  FLUoxetine (PROZAC) 20 MG capsule Take 1 capsule (20 mg total) by mouth daily. 02/04/24  Yes [name], [name], MD  fluticasone  (FLONASE ) 50 MCG/ACT nasal spray Place 1 spray into both nostrils 2 (two) times daily as needed for allergies or rhinitis. 01/01/24  Yes Lenon Marien CROME, MD  folic acid  (FOLVITE ) 1 MG tablet Take 1 tablet (1 mg total) by mouth daily. 09/12/23  Yes [name], [name], MD  furosemide  (LASIX ) 20 MG tablet Take 1 tablet (20 mg total) by mouth daily. 02/19/24  Yes Kotturi, Vinay K, MD  gabapentin  (NEURONTIN ) 100 MG capsule Take 1 capsule (100 mg total) by mouth at  bedtime. 02/04/24  Yes [name], [name], MD  insulin  aspart protamine  - aspart (NOVOLOG  70/30 MIX) (70-30) 100 UNIT/ML FlexPen Inject 3 Units into the skin 2 (two) times daily with a meal. 01/26/24  Yes Dezii, Alexandra, DO  loratadine  (CLARITIN ) 10 MG tablet Take 1 tablet (10 mg total) by mouth daily as needed for allergies. 01/01/24  Yes Lenon Marien CROME, MD  nitroGLYCERIN  (NITROSTAT ) 0.4 MG SL tablet Place 1 tablet (0.4 mg total) under the tongue every 5 (five) minutes as needed for chest pain. Max: 3 tablets per day 01/01/24 12/31/24 Yes Lenon Marien CROME, MD  pantoprazole  (PROTONIX ) 40 MG tablet Take 1 tablet (40 mg total) by mouth daily. 02/03/24  Yes Hammock, Sheri, NP  polyethylene glycol (MIRALAX  / GLYCOLAX ) 17 g packet Take 17 g by mouth daily. 01/01/24  Yes Lenon Marien CROME, MD  potassium chloride  (KLOR-CON  M) 10 MEQ tablet Take 1 tablet (10 mEq total) by mouth 2 (two) times daily for 7 days. 02/19/24 02/26/24 Yes Kotturi, Vinay K, MD  sacubitril -valsartan  (ENTRESTO ) 24-26 MG Take 1 tablet by mouth every 12 (twelve) hours. 11/18/23 11/17/24 Yes Gollan, Timothy J, MD  thiamine  (VITAMIN B-1) 100 MG tablet Take 1 tablet (100 mg total) by mouth daily. 02/20/23  Yes Caleen Qualia, MD  traZODone  (DESYREL ) 100 MG tablet Take 1 tablet (100 mg total) by mouth at bedtime. 02/06/24  Yes [name], [name], MD  artificial tears ophthalmic solution Place 1 drop into both eyes as needed for dry eyes. Patient not taking: Reported on 02/25/2024 12/26/23   Patel, Sona, MD  escitalopram  (LEXAPRO ) 10 MG tablet Take 1 tablet (10 mg total) by mouth daily. Patient not taking: Reported on 02/25/2024 01/29/24   [name], Devere POUR, MD    Social History   Socioeconomic History   Marital status: Married    Spouse name: [name]   Number of children: 3   Years of education: Not on file   Highest education level: Not on file  Occupational History   Not on file  Tobacco Use   Smoking status: Never    Passive exposure:  Never   Smokeless tobacco: Never  Vaping Use   Vaping status: Never Used  Substance and Sexual Activity   Alcohol  use: Yes    Alcohol /week: 21.0 standard drinks of alcohol     Types:  7 Glasses of wine, 14 Cans of beer per week    Comment: occassional   Drug use: No   Sexual activity: Not on file  Other Topics Concern   Not on file  Social History Narrative   Not on file   Social Drivers of Health   Financial Resource Strain: Low Risk  (07/11/2023)   Overall Financial Resource Strain (CARDIA)    Difficulty of Paying Living Expenses: Not hard at all  Food Insecurity: No Food Insecurity (02/26/2024)   Hunger Vital Sign    Worried About Running Out of Food in the Last Year: Never true    Ran Out of Food in the Last Year: Never true  Transportation Needs: No Transportation Needs (02/26/2024)   PRAPARE - Administrator, Civil Service (Medical): No    Lack of Transportation (Non-Medical): No  Physical Activity: Not on file  Stress: Not on file  Social Connections: Unknown (02/26/2024)   Social Connection and Isolation Panel    Frequency of Communication with Friends and Family: More than three times a week    Frequency of Social Gatherings with Friends and Family: More than three times a week    Attends Religious Services: 1 to 4 times per year    Active Member of [employer] or Organizations: Yes    Attends Banker Meetings: 1 to 4 times per year    Marital Status: Patient declined  Recent Concern: Social Connections - Moderately Isolated (01/17/2024)   Social Connection and Isolation Panel    Frequency of Communication with Friends and Family: More than three times a week    Frequency of Social Gatherings with Friends and Family: More than three times a week    Attends Religious Services: Patient declined    Database Administrator or Organizations: No    Attends Banker Meetings: Patient declined    Marital Status: Married  Catering Manager Violence:  Not At Risk (02/26/2024)   Humiliation, Afraid, Rape, and Kick questionnaire    Fear of Current or Ex-Partner: No    Emotionally Abused: No    Physically Abused: No    Sexually Abused: No     Family History  Problem Relation Age of Onset   Scoliosis Mother    Heart disease Father     ROS: Otherwise negative unless mentioned in HPI  Physical Examination  Vitals:   02/26/24 0018 02/26/24 0406  BP: (!) 160/83 136/73  Pulse: 76 60  Resp: 20 18  Temp: 97.9 F (36.6 C)   SpO2: 95% 98%   Body mass index is 28.02 kg/m.  General:  WDWN in NAD Gait: Not observed HENT: WNL, normocephalic Pulmonary: normal non-labored breathing, without Rales, rhonchi,  wheezing Cardiac: regular and Tachycardic at 100, without  Murmurs, rubs or gallops; without carotid bruits Abdomen: Positive bowel sounds throughout, soft, NT/ND, no masses Skin: without rashes Vascular Exam/Pulses: Palpable pulses throughout and all extremities are warm.  Patient noted to have a right below knee amputation. Extremities: without ischemic changes, without Gangrene , without cellulitis; with open wounds; history of open wound to right groin.  Delayed healing to right groin. Musculoskeletal: no muscle wasting or atrophy.  History of right below-knee amputation.  Neurologic: A&O X 3;  No focal weakness or paresthesias are detected; speech is fluent/normal Psychiatric:  The pt has Normal affect. Lymph:  Unremarkable  CBC    Component Value Date/Time   WBC 5.9 02/26/2024 0437   RBC 3.57 (L) 02/26/2024  0437   HGB 10.1 (L) 02/26/2024 0437   HGB 11.5 (L) 02/19/2024 1522   HCT 31.8 (L) 02/26/2024 0437   HCT 35.7 (L) 02/19/2024 1522   PLT 251 02/26/2024 0437   PLT 241 02/19/2024 1522   MCV 89.1 02/26/2024 0437   MCV 90 02/19/2024 1522   MCV 91 02/16/2012 2013   MCH 28.3 02/26/2024 0437   MCHC 31.8 02/26/2024 0437   RDW 14.7 02/26/2024 0437   RDW 13.9 02/19/2024 1522   RDW 13.0 02/16/2012 2013   LYMPHSABS 1.9  02/19/2024 1522   MONOABS 0.5 12/04/2023 1141   EOSABS 0.3 02/19/2024 1522   BASOSABS 0.0 02/19/2024 1522    BMET    Component Value Date/Time   NA 137 02/26/2024 0437   NA 131 (L) 02/19/2024 1522   NA 137 02/16/2012 2013   K 3.7 02/26/2024 0437   K 3.8 02/16/2012 2013   CL 102 02/26/2024 0437   CL 102 02/16/2012 2013   CO2 25 02/26/2024 0437   CO2 25 02/16/2012 2013   GLUCOSE 145 (H) 02/26/2024 0437   GLUCOSE 154 (H) 02/16/2012 2013   BUN 23 02/26/2024 0437   BUN 21 02/19/2024 1522   BUN 10 02/16/2012 2013   CREATININE 0.95 02/26/2024 0437   CREATININE 0.72 02/16/2012 2013   CALCIUM  8.5 (L) 02/26/2024 0437   CALCIUM  9.6 02/16/2012 2013   GFRNONAA >60 02/26/2024 0437   GFRNONAA >60 02/16/2012 2013   GFRAA >60 12/09/2018 0929   GFRAA >60 02/16/2012 2013    COAGS: Lab Results  Component Value Date   INR 1.3 (H) 12/12/2023   INR 1.3 (H) 12/04/2023   INR 1.4 (H) 07/10/2023     Non-Invasive Vascular Imaging:   None ordered.  Statin:  Yes.   Beta Blocker:  Yes.   Aspirin :  Yes.   ACEI:  No. ARB:  No. CCB use:  No Other antiplatelets/anticoagulants:  Yes.   Plavix  75 mg daily.    ASSESSMENT/PLAN: This is a 78 y.o. male who presents to [facility] emergency department with acute exacerbation of CHF due to medication changes.  He is also known to have a long delayed wound healing to his right groin.  Questionable infection to his groin.  He has a history of long-term antibiotics which were stopped a couple weeks ago because the wound looked well-healed in ID clinic back on 02/10/2024.  Vascular surgery was consulted to evaluate the wound.  Vascular surgery recommends wound care team consult for recommendations for wound care.  I do not believe the patient needs a wound VAC again as the bed of the wound is at the skin level but localized control of bacteria may help.  I recommend a culture of the wound bed to rule out any new organisms that may be growing from his home in his  groin.  I believe he was noted to have a E. coli in his groin last time.  He is currently on doxycycline  which should help cover this.  He is being followed by the outpatient therefore I do not believe he needs to be seen by infectious disease but we can let them know he is here.  I do not believe the patient needs any further intervention at this time.  Will continue to follow.   -I discussed the case with Dr. Cordella Shawl MD and he agrees with the plan.   [name] [facility] 02/26/2024 7:04 AM

## 2024-02-26 NOTE — Progress Notes (Signed)
 PROGRESS NOTE    Manuel [name]  FMW:[id number] DOB: 1945-10-24 DOA: 02/25/2024 PCP: [name], [name], MD     Brief Narrative:   From admission h and p     Assessment & Plan:   Active Problems:   Acute on chronic HFrEF (heart failure with reduced ejection fraction) (HCC)   Delayed wound healing, right groin   PAD with history of prior angioplasty and stent placement (peripheral artery disease)   Coronary artery disease with history of myocardial infarction without history of CABG   Diabetic polyneuropathy associated with type 2 diabetes mellitus (HCC)   Essential hypertension   S/P angioplasty with stent   BPH (benign prostatic hyperplasia)   S/P BKA (below knee amputation), 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 Granulomatous tissue present but to me does not appear infected as no surrounding induration/erythema/tenderness, no fever or leukocytosis. Followed by wound care nurse outpatient, also followed by ID and vascular. - vascular to see, wound care per them and wound care nurse (whose recs are in) - ID consult if vascular concern for infection  # HFrEF with acute exacerbation Several weeks orthopnea and pnd, here elevated bnp with vascular congestion on CXR. Patient reports urine output hasn't increased since arrival - strict I/os - increase lasix  from 40 iv bid to 60 - hold farxiga  given complicated infection - hold coreg  and entresto  for now pending cardiology recs  # Acute hypoxic respiratory failure Dyspneic on arrival requiring 3 liters to maintain sats in 90s - wean [place] O2 as able  #  PAD Extensive history of this, s/p right BKA - vascular to see - cont home asa, plavix , statin  # CAD S/p cabg, stents. No chest pain, normal trops - asa, plavix , statin as above  # GAD - home prozac  # T2DM Glucose controlled - SSI  # Sinusitis Reports sinus congestion but no fevers, significant pain. Recent covid/flu/rsv neg - hold on outpatient doxycycline  - resume home loratadine   # OSA - cpap qhs  DVT prophylaxis: lovenox  Code Status: dnr/dni Family Communication: no answer when wife [name] telephoned today  Level of care: Progressive Status is: Observation    Consultants:  Vascular, cardiology  Procedures: None thus far  Antimicrobials:  S/p bactrim     Subjective: Reports dyspnea improved with oxygen. No fevers or groin pain  Objective: Vitals:   02/26/24 0018 02/26/24 0406 02/26/24 0530 02/26/24 0804  BP: (!) 160/83 136/73  (!) 141/70  Pulse: 76 60  (!) 55  Resp: 20 18  18   Temp: 97.9 F (36.6 C)   97.7 F (36.5 C)  TempSrc:      SpO2: 95% 98%  100%  Weight: 83.6 kg  83.6 kg   Height:        Intake/Output Summary (Last 24 hours) at 02/26/2024 0832 Last data filed at 02/26/2024 0730 Gross per 24 hour  Intake 240 ml  Output 900 ml  Net -660 ml   Filed Weights   02/25/24 1631 02/26/24 0018 02/26/24 0530  Weight: 83.9 kg 83.6 kg 83.6 kg    Examination:  General exam: Appears calm and comfortable  Respiratory system: Clear  to auscultation. Respiratory effort normal. Cardiovascular system: S1 & S2 heard, distant heart sounds, soft murmur Gastrointestinal system: Abdomen is nondistended, soft and nontender.   Central nervous system: Alert and oriented. No focal neurological deficits. Extremities: right bka. Left leg is mottled, cool distal to the knee Skin:  Psychiatry: Judgement and insight appear normal. Mood & affect appropriate.     Data Reviewed: I have personally reviewed following labs and imaging studies  CBC: Recent Labs   Lab 02/19/24 1522 02/25/24 1641 02/26/24 0437  WBC 7.3 7.1 5.9  NEUTROABS 4.3  --   --   HGB 11.5* 11.3* 10.1*  HCT 35.7* 35.1* 31.8*  MCV 90 87.5 89.1  PLT 241 268 251   Basic Metabolic Panel: Recent Labs  Lab 02/19/24 1522 02/25/24 1641 02/25/24 1846 02/26/24 0437  NA 131* 135  --  137  K 5.2 4.7  --  3.7  CL 97 99  --  102  CO2 18* 23  --  25  GLUCOSE 192* 198*  --  145*  BUN 21 21  --  23  CREATININE 0.87 1.01  --  0.95  CALCIUM  9.5 9.1  --  8.5*  MG  --   --  2.2  --    GFR: Estimated Creatinine Clearance: 65.4 mL/min (by C-G formula based on SCr of 0.95 mg/dL). Liver Function Tests: Recent Labs  Lab 02/19/24 1522  AST 20  ALT 19  ALKPHOS 139*  BILITOT 0.6  PROT 7.2  ALBUMIN 4.2   No results for input(s): LIPASE, AMYLASE in the last 168 hours. No results for input(s): AMMONIA in the last 168 hours. Coagulation Profile: No results for input(s): INR, PROTIME in the last 168 hours. Cardiac Enzymes: No results for input(s): CKTOTAL, CKMB, CKMBINDEX, TROPONINI in the last 168 hours. BNP (last 3 results) Recent Labs    07/21/23 1246  PROBNP 9,804*   HbA1C: No results for input(s): HGBA1C in the last 72 hours. CBG: Recent Labs  Lab 02/26/24 0035 02/26/24 0802  GLUCAP 152* 141*   Lipid Profile: No results for input(s): CHOL, HDL, LDLCALC, TRIG, CHOLHDL, LDLDIRECT in the last 72 hours. Thyroid Function Tests: No results for input(s): TSH, T4TOTAL, FREET4, T3FREE, THYROIDAB in the last 72 hours. Anemia Panel: No results for input(s): VITAMINB12, FOLATE, FERRITIN, TIBC, IRON , RETICCTPCT in the last 72 hours. Urine analysis:    Component Value Date/Time   COLORURINE YELLOW (A) 02/25/2024 1846   APPEARANCEUR CLEAR (A) 02/25/2024 1846   LABSPEC 1.017 02/25/2024 1846   PHURINE 5.0 02/25/2024 1846   GLUCOSEU >=500 (A) 02/25/2024 1846   HGBUR NEGATIVE 02/25/2024 1846   BILIRUBINUR NEGATIVE  02/25/2024 1846   KETONESUR NEGATIVE 02/25/2024 1846   PROTEINUR 100 (A) 02/25/2024 1846   NITRITE NEGATIVE 02/25/2024 1846   LEUKOCYTESUR NEGATIVE 02/25/2024 1846   Sepsis Labs: @LABRCNTIP (procalcitonin:4,lacticidven:4)  ) Recent Results (from the past 240 hours)  Aerobic Culture w Gram Stain (superficial specimen)     Status: None (Preliminary result)   Collection Time: 02/26/24  1:29 AM   Specimen: Groin; Wound  Result Value Ref Range Status   Specimen Description   Final    GROIN Performed at [facility], [address] [place], [place] [address]    Special Requests   Final    NONE Performed at [facility], [address] [place], [place] [address]    Gram Stain   Final    FEW WBC PRESENT,BOTH PMN AND MONONUCLEAR FEW GRAM POSITIVE COCCI Performed  at [facility], [address] [place], [place] [address]    Culture PENDING  Incomplete   Report Status PENDING  Incomplete         Radiology Studies: DG Chest 2 View Result Date: 02/25/2024 EXAM: 2 VIEW(S) XRAY OF THE CHEST 02/25/2024 05:10:00 PM COMPARISON: 02/19/2024 CLINICAL HISTORY: Shortness of breath. FINDINGS: LUNGS AND PLEURA: No focal pulmonary opacity. Interstitial prominence likely reflects interstitial edema. Small bilateral effusions. No pneumothorax. HEART AND MEDIASTINUM: Cardiomegaly with vascular congestion. BONES AND SOFT TISSUES: No acute osseous abnormality. IMPRESSION: 1. Cardiomegaly with vascular congestion and interstitial edema. 2. Small bilateral pleural effusions. Electronically signed by: Franky Crease MD 02/25/2024 05:23 PM EST RP Workstation: [id number]        Scheduled Meds:  aspirin  EC  81 mg Oral Daily   atorvastatin   80 mg Oral QHS   carvedilol   3.125 mg Oral BID WC   clopidogrel   75 mg Oral Daily   dapagliflozin  propanediol  10 mg Oral Daily   enoxaparin  (LOVENOX ) injection  40 mg Subcutaneous Q24H   ferrous sulfate   325 mg Oral Q breakfast    FLUoxetine  20 mg Oral Daily   folic acid   1 mg Oral Daily   furosemide   40 mg Intravenous Q12H   gabapentin   100 mg Oral QHS   insulin  aspart  0-15 Units Subcutaneous TID WC   insulin  aspart  0-5 Units Subcutaneous QHS   pantoprazole   40 mg Oral Daily   polyethylene glycol  17 g Oral Daily   potassium chloride   10 mEq Oral BID   sacubitril -valsartan   1 tablet Oral Q12H   sulfamethoxazole -trimethoprim   1 tablet Oral Q12H   thiamine   100 mg Oral Daily   traZODone   100 mg Oral QHS   Continuous Infusions:   LOS: 0 days     Devaughn KATHEE Ban, MD [facility]   If 7PM-7AM, please contact night-coverage [email or web address] Password [facility] 02/26/2024, 8:32 AM

## 2024-02-27 ENCOUNTER — Telehealth: Payer: Self-pay | Admitting: Family Medicine

## 2024-02-27 DIAGNOSIS — I509 Heart failure, unspecified: Secondary | ICD-10-CM

## 2024-02-27 DIAGNOSIS — E782 Mixed hyperlipidemia: Secondary | ICD-10-CM

## 2024-02-27 DIAGNOSIS — I255 Ischemic cardiomyopathy: Secondary | ICD-10-CM | POA: Diagnosis not present

## 2024-02-27 DIAGNOSIS — I25118 Atherosclerotic heart disease of native coronary artery with other forms of angina pectoris: Secondary | ICD-10-CM

## 2024-02-27 LAB — CBC
HCT: 39.6 % (ref 39.0–52.0)
Hemoglobin: 12.6 g/dL — ABNORMAL LOW (ref 13.0–17.0)
MCH: 27.8 pg (ref 26.0–34.0)
MCHC: 31.8 g/dL (ref 30.0–36.0)
MCV: 87.4 fL (ref 80.0–100.0)
Platelets: 275 K/uL (ref 150–400)
RBC: 4.53 MIL/uL (ref 4.22–5.81)
RDW: 14.6 % (ref 11.5–15.5)
WBC: 9.2 K/uL (ref 4.0–10.5)
nRBC: 0 % (ref 0.0–0.2)

## 2024-02-27 LAB — BASIC METABOLIC PANEL WITH GFR
Anion gap: 12 (ref 5–15)
BUN: 21 mg/dL (ref 8–23)
CO2: 28 mmol/L (ref 22–32)
Calcium: 9.3 mg/dL (ref 8.9–10.3)
Chloride: 99 mmol/L (ref 98–111)
Creatinine, Ser: 0.66 mg/dL (ref 0.61–1.24)
GFR, Estimated: >60 mL/min (ref >60)
Glucose, Bld: 135 mg/dL — ABNORMAL HIGH (ref 70–99)
Potassium: 3.8 mmol/L (ref 3.5–5.1)
Sodium: 139 mmol/L (ref 135–145)

## 2024-02-27 LAB — GLUCOSE, CAPILLARY
Glucose-Capillary: 127 mg/dL — ABNORMAL HIGH (ref 70–99)
Glucose-Capillary: 146 mg/dL — ABNORMAL HIGH (ref 70–99)
Glucose-Capillary: 157 mg/dL — ABNORMAL HIGH (ref 70–99)
Glucose-Capillary: 163 mg/dL — ABNORMAL HIGH (ref 70–99)

## 2024-02-27 MED ORDER — SPIRONOLACTONE 12.5 MG HALF TABLET
12.5000 mg | ORAL_TABLET | Freq: Every day | ORAL | Status: DC
  Administered 2024-02-27 – 2024-02-28 (×2): 12.5 mg via ORAL
  Filled 2024-02-27 (×2): qty 1

## 2024-02-27 MED ORDER — NYSTATIN 100000 UNIT/GM EX POWD
Freq: Two times a day (BID) | CUTANEOUS | Status: DC
  Filled 2024-02-27: qty 15

## 2024-02-27 MED ORDER — LINEZOLID 600 MG PO TABS
600.0000 mg | ORAL_TABLET | Freq: Two times a day (BID) | ORAL | Status: DC
  Administered 2024-02-27 – 2024-02-28 (×2): 600 mg via ORAL
  Filled 2024-02-27 (×3): qty 1

## 2024-02-27 NOTE — Progress Notes (Signed)
 Pt's right groin wound completely saturated and very foul odor, leaking light green/tan moderately thick drainage. Dr. Kandis here bedside assessing the wound.

## 2024-02-27 NOTE — TOC Progression Note (Signed)
 Transition of Care [facility]) - Progression Note    Patient Details  Name: Manuel [name] MRN: [id number] Date of Birth: 12/31/1945  Transition of [facility]) CM/SW Contact  Manuel DELENA Daring, RN Phone Number: 02/27/2024, 12:04 PM  Clinical Narrative:    Readmission Risk Assessment completed. Confirmed PCP is Manuel Zieglar, MD. Patient's wife drives him to his appointments. Local pharmacy is [facility] on [address], [place], [place]. Denies problems affording meds. Patient lives in single family home with wife, Manuel [name]. Has HH services with [facility], including nursing and PT. Expects those to continue, however patient states he has been told he no longer needs a wound vac. Currently has walker in home. Says wife will provide transportation at discharge. TOC will follow.   Expected Discharge Plan: Home w Home Health Services Barriers to Discharge: Continued Medical Work up               Expected Discharge Plan and Services   Discharge Planning Services: CM Consult   Living arrangements for the past 2 months: Single Family Home                           HH Arranged: PT, RN, OT HH Agency:  ([facility]) Date HH Agency Contacted: 02/26/24   Representative spoke with at [facility] Agency: Shaun   Social Drivers of Health (SDOH) Interventions SDOH Screenings   Food Insecurity: No Food Insecurity (02/26/2024)  Housing: Low Risk  (02/26/2024)  Transportation Needs: No Transportation Needs (02/26/2024)  Utilities: Patient Declined (02/26/2024)  Depression (PHQ2-9): High Risk (02/04/2024)  Financial Resource Strain: Low Risk  (07/11/2023)  Social Connections: Unknown (02/26/2024)  Recent Concern: Social Connections - Moderately Isolated (01/17/2024)  Tobacco Use: Low Risk  (02/25/2024)  Recent Concern: Tobacco Use - Medium Risk (02/25/2024)   Received from [facility]    Readmission Risk Interventions    02/27/2024   11:56 AM 12/05/2023    3:09 PM 07/09/2023    8:30 PM   Readmission Risk Prevention Plan  Transportation Screening Complete Complete Complete  PCP or Specialist Appt within 3-5 Days --    HRI or Home Care Consult --    Social Work Consult for Recovery Care Planning/Counseling Complete    Palliative Care Screening Not Applicable    Medication Review [employer])  Complete Complete  PCP or Specialist appointment within 3-5 days of discharge  Complete   HRI or Home Care Consult   Complete  SW Recovery Care/Counseling Consult  Complete Complete  Palliative Care Screening  Not Applicable Not Applicable  Skilled Nursing Facility  Not Applicable Not Applicable

## 2024-02-27 NOTE — Progress Notes (Signed)
 PROGRESS NOTE    Manuel [name]  FMW:[id number] DOB: [birth year] DOA: 02/25/2024 PCP: [name], [name], MD     Brief Narrative:   From admission h and p  homas P [name] is a 78 y.o. male with medical history significant for DM, HTN, OSA,  HFrEF 25-30%,  CAD s/p multiple stents,carotid artery stenosis,, extensive PAD s/p stent placement right femoral artery complicated by femoral artery rupture/repair/wound dehiscence/serratia infection s/p ertapenem /Bactrim  (wound vac dc'd 02/23/24) being admitted with a CHF exacerbation in the setting of recent medication adjustment as well as concerns for infection of right groin wound.  Regarding CHF exacerbation, patient states that about 3 weeks prior his PCP noted his BP to be low and they held his spironolactone  and Entresto .  Since then he noted swelling to his left leg and then progressively worsening dyspnea with exertion, now with rest as well as orthopnea.  A week ago he went to see his PCP and he was started on Lasix  20 mg daily however due to symptom progression he presented to the ED. Separately, regarding his wound, his wound VAC was removed on 02/23/2024 and since then he has noticed increased  wetness and drainage to the area.  He has no fever or chills.  He last saw ID 02/10/2024 when photos were taken showing good healing.  He has not been on antibiotics for several weeks, except for doxycycline  that was started the day prior for sinusitis..   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 On exam today moderate purulence present, there is a sinus track that goes down an inch or two, expressed moderate purulence from that - will touch base w/ ID and vascular - culture from probe into that sinus track sent  # HFrEF with acute exacerbation Several weeks orthopnea and pnd, here elevated bnp with vascular congestion on CXR. 2 liter urine output since yesterday, symptomatically much improved - strict I/os - cardiology following, recs appreciated. Continuing IV diuresis, likely just one more day - coreg  on hold - entresto  and jardiance resumed, spiro added  # Acute hypoxic respiratory failure Resolved w/ diuresis - monitor  # PAD Extensive history of this, s/p right BKA - vascular to see - cont home asa, plavix , statin  # CAD S/p cabg, stents. No chest pain, normal trops - asa, plavix , statin as above  # GAD - home prozac  # T2DM Glucose controlled - SSI  # Sinusitis Reports sinus congestion but no fevers, significant pain. Recent covid/flu/rsv neg - hold on outpatient doxycycline  - resumed home  loratadine   # OSA - cpap qhs  DVT prophylaxis: lovenox  Code Status: dnr/dni Family Communication: none at bedside  Level of care: Progressive Status is: inpt    Consultants:  Vascular, cardiology, ID  Procedures: None thus far  Antimicrobials:  S/p bactrim     Subjective: Swelling and dyspnea much  improved  Objective: Vitals:   02/27/24 0500 02/27/24 0800 02/27/24 1135 02/27/24 1424  BP:  (!) 145/68 (!) 142/83 133/65  Pulse:  69 70 60  Resp:      Temp:  97.7 F (36.5 C) 97.9 F (36.6 C) 98.2 F (36.8 C)  TempSrc:      SpO2:  100% 98% 91%  Weight: 82.6 kg     Height:        Intake/Output Summary (Last 24 hours) at 02/27/2024 1457 Last data filed at 02/27/2024 1430 Gross per 24 hour  Intake 480 ml  Output 2450 ml  Net -1970 ml   Filed Weights   02/26/24 0018 02/26/24 0530 02/27/24 0500  Weight: 83.6 kg 83.6 kg 82.6 kg    Examination:  General exam: Appears calm and comfortable  Respiratory system: Clear to auscultation. Respiratory effort normal. Cardiovascular system: S1 & S2 heard, distant heart sounds, soft murmur Gastrointestinal system: Abdomen is nondistended, soft and nontender.   Central nervous system: Alert and oriented. No focal neurological deficits. Extremities: right bka. Left leg is mottled, cool distal to the knee. LLE edema improving Skin: ulceration right groin, moderate purulence expressed Psychiatry: Judgement and insight appear normal. Mood & affect appropriate.     Data Reviewed: I have personally reviewed following labs and imaging studies  CBC: Recent Labs  Lab 02/25/24 1641 02/26/24 0437 02/27/24 0520  WBC 7.1 5.9 9.2  HGB 11.3* 10.1* 12.6*  HCT 35.1* 31.8* 39.6  MCV 87.5 89.1 87.4  PLT 268 251 275   Basic Metabolic Panel: Recent Labs  Lab 02/25/24 1641 02/25/24 1846 02/26/24 0437 02/27/24 0520  NA 135  --  137 139  [name] 4.7  --  3.7 3.8  CL 99  --  102 99  CO2 23  --  25 28  GLUCOSE 198*  --  145* 135*  BUN 21  --  23 21  CREATININE 1.01  --  0.95 0.66  CALCIUM  9.1  --  8.5* 9.3  MG  --  2.2  --   --    GFR: Estimated Creatinine Clearance: 77.2 mL/min (by C-G formula based on SCr of 0.66 mg/dL). Liver Function Tests: No results for input(s): AST, ALT, ALKPHOS, BILITOT, PROT, ALBUMIN in the last 168  hours.  No results for input(s): LIPASE, AMYLASE in the last 168 hours. No results for input(s): AMMONIA in the last 168 hours. Coagulation Profile: No results for input(s): INR, PROTIME in the last 168 hours. Cardiac Enzymes: No results for input(s): CKTOTAL, CKMB, CKMBINDEX, TROPONINI in the last 168 hours. BNP (last 3 results) Recent Labs    07/21/23 1246  PROBNP 9,804*   HbA1C: No results for input(s): HGBA1C in the last 72 hours. CBG: Recent Labs  Lab 02/26/24 0802 02/26/24 1200 02/26/24 1609 02/27/24 0757 02/27/24 1132  GLUCAP 141* 163* 166* 127* 146*   Lipid Profile: No results for input(s): CHOL, HDL, LDLCALC, TRIG, CHOLHDL, LDLDIRECT in the last 72 hours. Thyroid Function Tests: No results for input(s): TSH, T4TOTAL, FREET4, T3FREE, THYROIDAB in the last 72 hours. Anemia Panel: No results for input(s): VITAMINB12, FOLATE, FERRITIN, TIBC, IRON , RETICCTPCT in the last 72 hours. Urine analysis:  Component Value Date/Time   COLORURINE YELLOW (A) 02/25/2024 1846   APPEARANCEUR CLEAR (A) 02/25/2024 1846   LABSPEC 1.017 02/25/2024 1846   PHURINE 5.0 02/25/2024 1846   GLUCOSEU >=500 (A) 02/25/2024 1846   HGBUR NEGATIVE 02/25/2024 1846   BILIRUBINUR NEGATIVE 02/25/2024 1846   KETONESUR NEGATIVE 02/25/2024 1846   PROTEINUR 100 (A) 02/25/2024 1846   NITRITE NEGATIVE 02/25/2024 1846   LEUKOCYTESUR NEGATIVE 02/25/2024 1846   Sepsis Labs: @LABRCNTIP (procalcitonin:4,lacticidven:4)  ) Recent Results (from the past 240 hours)  Aerobic Culture w Gram Stain (superficial specimen)     Status: None (Preliminary result)   Collection Time: 02/26/24  1:29 AM   Specimen: Groin; Wound  Result Value Ref Range Status   Specimen Description   Final    GROIN Performed at [facility], [address] [place], [place] [address]    Special Requests   Final    NONE Performed at [facility], [address] [place], [place] [address]    Gram Stain   Final    FEW WBC PRESENT,BOTH PMN AND MONONUCLEAR FEW GRAM POSITIVE COCCI    Culture   Final    ABUNDANT GROUP B STREP(S.AGALACTIAE)ISOLATED TESTING AGAINST S. AGALACTIAE NOT ROUTINELY PERFORMED DUE TO PREDICTABILITY OF AMP/PEN/VAN SUSCEPTIBILITY. ABUNDANT DIPHTHEROIDS(CORYNEBACTERIUM SPECIES) Standardized susceptibility testing for this organism is not available. Performed at [facility], [address], [place], [place] [address]    Report Status PENDING  Incomplete         Radiology Studies: DG Chest 2 View Result Date: 02/25/2024 EXAM: 2 VIEW(S) XRAY OF THE CHEST 02/25/2024 05:10:00 PM COMPARISON: 02/19/2024 CLINICAL HISTORY: Shortness of breath. FINDINGS: LUNGS AND PLEURA: No focal pulmonary opacity. Interstitial prominence likely reflects interstitial edema. Small bilateral effusions. No pneumothorax. HEART AND MEDIASTINUM: Cardiomegaly with vascular congestion. BONES AND SOFT TISSUES: No acute osseous abnormality. IMPRESSION: 1. Cardiomegaly with vascular congestion and interstitial edema. 2. Small bilateral pleural effusions. Electronically signed by: Franky Crease MD 02/25/2024 05:23 PM EST RP Workstation: [id number]        Scheduled Meds:  aspirin  EC  81 mg Oral Daily   atorvastatin   80 mg Oral QHS   clopidogrel   75 mg Oral Daily   dapagliflozin  propanediol  10 mg Oral Daily   enoxaparin  (LOVENOX ) injection  40 mg Subcutaneous Q24H   ferrous sulfate   325 mg Oral Q breakfast   FLUoxetine  20 mg Oral Daily   folic acid   1 mg Oral Daily   furosemide   60 mg Intravenous Q12H   gabapentin   100 mg Oral QHS   insulin  aspart  0-15 Units Subcutaneous TID WC   insulin  aspart  0-5 Units Subcutaneous QHS   pantoprazole   40 mg Oral Daily   polyethylene glycol  17 g Oral Daily   potassium chloride   10 mEq Oral BID   sacubitril -valsartan   1 tablet Oral BID   spironolactone   12.5 mg Oral Daily   thiamine   100 mg Oral Daily    traZODone   100 mg Oral QHS   Continuous Infusions:   LOS: 1 day     Devaughn KATHEE Ban, MD [facility]   If 7PM-7AM, please contact night-coverage [email or web address] Password [email or web address] 02/27/2024, 2:57 PM

## 2024-02-27 NOTE — Telephone Encounter (Signed)
 Called an LM asking how he is doing.

## 2024-02-27 NOTE — Plan of Care (Signed)

## 2024-02-27 NOTE — Progress Notes (Signed)
 Progress Note    02/27/2024 10:49 AM * No surgery found *  Subjective:   This is a 78 y.o. male well-known to vein and vascular surgery.  He presents to [facility] emergency department for shortness of breath and is being admitted for CHF exacerbation in the setting of recent medication adjustment as well as concern for infection of his right groin.  Below are listed the procedures in which the patient wanted padding along with delayed wound healing to his right groin.  He had wound VAC placement in the hospital and then at home with home health.  Wound VAC was removed as the wound bed had healed to the skin and was doing well a couple of weeks ago.  He followed up with infectious disease on 02/10/2024.  At that time photos were taken showing good healing.  Patient currently has not been on antibiotics for weeks except for doxycycline  that was started a day or so ago for his sinusitis.   PROCEDURE:12/12/2023 1.   Right groin reexploration for bleeding 2.   Evacuation of right groin hematoma 3.   Suture repair of proximal bypass anastomosis in the distal external iliac artery 4.   Replacement of the sartorius flap for coverage 5.   Placement of gentamicin  and vancomycin  antibiotic impregnated beads 6.   Negative pressure dressing placement right groin   PROCEDURE:12/11/2023 1.   Right external iliac artery to profunda femoris artery bypass with autologous ipsilateral superficial femoral artery 2.   Resection of infected right common femoral artery and removal of common femoral artery, profunda femoris artery, and superficial femoral artery stents 3.   Harvest of right superficial femoral artery for bypass with endarterectomy of right superficial femoral artery to make it usable for bypass conduit 4.   Right sartorius flap placement 5.   Negative pressure dressing placement right groin 6.   Redo groin exploration for vascular surgery   Patient is resting comfortably in bed this morning.  Patient  is concerned that the wound weeps some.  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  His leg is warm with palpable pulses his popliteal artery.  Patient endorses he still remains somewhat short of breath.  He states he has been started on Lasix  drip today.  No other complaints overnight.  Vitals all remained stable.   Vitals:   02/27/24 0329 02/27/24 0800  BP: 137/64 (!) 145/68  Pulse: 61 69  Resp: 16   Temp: 97.9 F (36.6 C) 97.7 F (36.5 C)  SpO2: 100% 100%   Physical Exam: Cardiac:  RRR, normal S1 and S2, no murmurs present. Lungs: Non labored breathing, requiring 2 L nasal cannula oxygen, bilateral lower rales.  No rhonchi or wheezing noted. Incisions: Delayed wound healing to right groin postoperative incision.  Dressing clean dry and intact. Extremities: Palpable pulses throughout except right lower extremity with BKA.  Positive right lower extremity femoral and popliteal pulse. Abdomen: Positive bowel sounds throughout, soft, nontender nondistended. Neurologic: Alert and oriented x 3, answers all questions and follows commands appropriately.  CBC    Component Value Date/Time   WBC 9.2 02/27/2024 0520   RBC 4.53 02/27/2024 0520   HGB 12.6 (L) 02/27/2024 0520   HGB 11.5 (L) 02/19/2024 1522   HCT 39.6 02/27/2024 0520   HCT 35.7 (L) 02/19/2024 1522   PLT 275 02/27/2024 0520   PLT 241 02/19/2024 1522   MCV 87.4 02/27/2024 0520   MCV 90 02/19/2024 1522   MCV 91 02/16/2012 2013  MCH 27.8 02/27/2024 0520   MCHC 31.8 02/27/2024 0520   RDW 14.6 02/27/2024 0520   RDW 13.9 02/19/2024 1522   RDW 13.0 02/16/2012 2013   LYMPHSABS 1.9 02/19/2024 1522   MONOABS 0.5 12/04/2023 1141   EOSABS 0.3 02/19/2024 1522   BASOSABS 0.0 02/19/2024 1522    BMET    Component Value Date/Time   NA 139 02/27/2024 0520   NA 131 (L) 02/19/2024 1522   NA 137 02/16/2012 2013   K 3.8 02/27/2024 0520   K 3.8 02/16/2012 2013   CL 99 02/27/2024 0520   CL 102 02/16/2012 2013   CO2  28 02/27/2024 0520   CO2 25 02/16/2012 2013   GLUCOSE 135 (H) 02/27/2024 0520   GLUCOSE 154 (H) 02/16/2012 2013   BUN 21 02/27/2024 0520   BUN 21 02/19/2024 1522   BUN 10 02/16/2012 2013   CREATININE 0.66 02/27/2024 0520   CREATININE 0.72 02/16/2012 2013   CALCIUM  9.3 02/27/2024 0520   CALCIUM  9.6 02/16/2012 2013   GFRNONAA >60 02/27/2024 0520   GFRNONAA >60 02/16/2012 2013   GFRAA >60 12/09/2018 0929   GFRAA >60 02/16/2012 2013    INR    Component Value Date/Time   INR 1.3 (H) 12/12/2023 0816     Intake/Output Summary (Last 24 hours) at 02/27/2024 1049 Last data filed at 02/27/2024 1016 Gross per 24 hour  Intake 360 ml  Output 1850 ml  Net -1490 ml     Assessment/Plan:  78 y.o. male is s/p SEE ABOVE * No surgery found *   PLAN Greatly appreciate wound care's input at the bedside.  Vascular surgery agrees with the treatment of his right groin wound with Aquacel daily to be covered with foam dressing. Pus noted from wound site and since Mr. Cuartas has been followed by infectious disease I sent a note to physician covering requesting outpatient follow-up. Okay per vascular surgery for patient to be discharged home with home health wound care follow-up as soon as patient is medically stable.  DVT prophylaxis: ASA 81 mg daily, Plavix  75 mg daily and Lovenox  40 mg subcu every 24 hours.   [name] [facility] 02/27/2024 10:49 AM

## 2024-02-27 NOTE — Progress Notes (Addendum)
  Progress Note  Patient Name: Manuel [name] Date of Encounter: 02/27/2024 [facility] Cardiologist: None   Interval Summary    UOP -2.1L. He remains on 3L O2. Breathing is improving. He is overall feeling much better.   Vital Signs Vitals:   02/27/24 0020 02/27/24 0329 02/27/24 0500 02/27/24 0800  BP: (!) 148/101 137/64  (!) 145/68  Pulse: 69 61  69  Resp: 18 16    Temp: 98 F (36.7 C) 97.9 F (36.6 C)  97.7 F (36.5 C)  TempSrc:      SpO2: 100% 100%  100%  Weight:   82.6 kg   Height:        Intake/Output Summary (Last 24 hours) at 02/27/2024 0922 Last data filed at 02/26/2024 1654 Gross per 24 hour  Intake --  Output 1850 ml  Net -1850 ml      02/27/2024    5:00 AM 02/26/2024    5:30 AM 02/26/2024   12:18 AM  Last 3 Weights  Weight (lbs) 182 lb 1.6 oz 184 lb 4.9 oz 184 lb 4.9 oz  Weight (kg) 82.6 kg 83.6 kg 83.6 kg      Telemetry/ECG  SR/SB HR 50 to 60s - Personally Reviewed  Physical Exam  GEN: No acute distress.   Neck: + JVD Cardiac: RRR, no murmurs, rubs, or gallops.  Respiratory: mild crackles. 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 GDMT had been stopped due to hypotension, then he saw PCP for SOB/orthopnea who started Lasix  20mg  daily. - IV lasix  60mg  BID - PTA lasix  20mg  daily, coreg  3.181mBIG, farxiga  10mg  daily, Entresto  24/26mg BID  - restarted on Entresto  24/26mg  BID and Farxiga  10mg  daily.  - will hold BB given bradycardia - start spiro 12.5mg  daily - He is on 3LO2>wean as able - Net -2.5L - Kidney function stable - Continue with IV lasix . 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

## 2024-02-27 NOTE — Progress Notes (Signed)
 Heart Failure Stewardship Pharmacy Note  PCP: [name], [name], MD PCP-Cardiologist: None  HPI: Manuel [name] is a 78 y.o. male with CAD, PAD s/p right BKA, gout, hyperlipidemia, hypertension, depression, anxiety, insomnia who presented with progressive shortness of breath after stopping spironolactone  and Entresto  as well as concern for groin wound infection. On admission, BNP was 2345.4, HS-troponin was 15. Chest x-ray noted vascular congestion, interstitial edema, and bilateral pleural effusions.   Pertinent cardiac history: CAD s/p PCI to [facility] with overlapping stents in 2011. extensive PAD s/p PTA L common Iliac, R peroneal, R distal SFA/prox popliteal, and stent to R distal SFA/prox popliteal arteries (05/14/21). Echo in 10/2022 with LVEF of 25-30%. Echo 06/2023 with LVEF 25-30%, moderately dilated LV, and mild valvular disease. LHC 06/2023 where 3 DES were placed to the LAD. Femoral artery stent placed 11/2023 after traumatic fall. Echo 11/2023 showed LVEF of 20-25% with G1DD, and mild MR.   Pertinent Lab Values: Creatinine  Date Value Ref Range Status  02/16/2012 0.72 0.60 - 1.30 mg/dL Final   Creatinine, Ser  Date Value Ref Range Status  02/27/2024 0.66 0.61 - 1.24 mg/dL Final   BUN  Date Value Ref Range Status  02/27/2024 21 8 - 23 mg/dL Final  89/69/7974 21 8 - 27 mg/dL Final  89/72/7986 10 7 - 18 mg/dL Final   Potassium  Date Value Ref Range Status  02/27/2024 3.8 3.5 - 5.1 mmol/L Final  02/16/2012 3.8 3.5 - 5.1 mmol/L Final   Sodium  Date Value Ref Range Status  02/27/2024 139 135 - 145 mmol/L Final  02/19/2024 131 (L) 134 - 144 mmol/L Final  02/16/2012 137 136 - 145 mmol/L Final   B Natriuretic Peptide  Date Value Ref Range Status  02/25/2024 2,345.4 (H) 0.0 - 100.0 pg/mL Final    Comment:    Performed at [facility], [address] [place], [place] [address]   Magnesium   Date Value Ref Range Status  02/25/2024 2.2 1.7 - 2.4 mg/dL Final     Comment:    Performed at [facility], [address] [place], [place] [address]   Hemoglobin A1C  Date Value Ref Range Status  02/04/2024 6.4 (A) 4.0 - 5.6 % Final   Hgb A1c MFr Bld  Date Value Ref Range Status  07/10/2023 8.0 (H) 4.8 - 5.6 % Final    Comment:    (NOTE) Pre diabetes:          5.7%-6.4%  Diabetes:              >6.4%  Glycemic control for   <7.0% adults with diabetes    TSH  Date Value Ref Range Status  04/19/2023 1.416 0.350 - 4.500 uIU/mL Final    Comment:    Performed by a 3rd Generation assay with a functional sensitivity of <=0.01 uIU/mL. Performed at [facility], [address] [place], [place] [address]     Vital Signs: Temp:  [97.7 F (36.5 C)-98 F (36.7 C)] 97.9 F (36.6 C) (11/07 0329) Pulse Rate:  [55-69] 61 (11/07 0329) Cardiac Rhythm: Normal sinus rhythm (11/06 1907) Resp:  [16-18] 16 (11/07 0329) BP: (137-159)/(55-101) 137/64 (11/07 0329) SpO2:  [90 %-100 %] 100 % (11/07 0329) Weight:  [82.6 kg (182 lb 1.6 oz)] 82.6 kg (182 lb 1.6 oz) (11/07 0500)  Intake/Output Summary (Last 24 hours) at 02/27/2024 0750 Last data filed at 02/26/2024 1654 Gross per 24 hour  Intake --  Output 1850 ml  Net -  1850 ml   Current Heart Failure Medications:  Loop diuretic: furosemide  60 mg IV BID Beta-Blocker: none ACEI/ARB/ARNI: Entresto  24-26 mg BID MRA: spironolactone  12.5 mg daily SGLT2i: Farxiga  10 mg daily Other: none  Prior to admission Heart Failure Medications:  Loop diuretic: furosemide  20 mg daily Beta-Blocker: carvedilol  3.125 mg BID ACEI/ARB/ARNI: Entresto  24-26 mg BID MRA: spironolactone  25 mg daily SGLT2i: Farxiga  10 mg daily Other: none  Assessment: 1. 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 NYHA class III symptoms.  -Symptoms: Shortness of breath improving. Requiring 3L O2.  -Volume: Still somewhat hypervolemic. Creatinine and BUN stable with  diuresis. Weight trending down. Continue furosemide  60 mg IV BID.  -Hemodynamics: BP elevated with HR 60s. -BB: Home carvedilol  held for bradycardia. Can consider resuming when HR is stable in mid 60s or more. -ACEI/ARB/ARNI: Agree with restarting Entresto  24-26 mg BID. Consider increasing tomorrow if BP remains up. -MRA: Agree with restarting spironolactone . Could consider 25 mg daily. -SGLT2i: Agree with adding home Farxiga .  Plan: 1) Medication changes recommended at this time: -None  2) Patient assistance: -Pending  3) Education: - Patient has been educated on current HF medications and potential additions to HF medication regimen - Patient verbalizes understanding that over the next few months, these medication doses may change and more medications may be added to optimize HF regimen - Patient has been educated on basic disease state pathophysiology and goals of therapy  Please do not hesitate to reach out with questions or concerns,  Jaun Bash, PharmD, CPP, BCPS, [facility] Heart Failure Pharmacist  Phone - [phone] 02/27/2024 10:21 AM

## 2024-02-27 NOTE — Progress Notes (Incomplete)
  Progress Note    02/27/2024 7:56 AM * No surgery found *  Subjective:  ***   Vitals:   02/27/24 0020 02/27/24 0329  BP: (!) 148/101 137/64  Pulse: 69 61  Resp: 18 16  Temp: 98 F (36.7 C) 97.9 F (36.6 C)  SpO2: 100% 100%   Physical Exam: Cardiac:  *** Lungs:  *** Incisions:  *** Extremities:  *** Abdomen:  *** Neurologic: ***  CBC    Component Value Date/Time   WBC 9.2 02/27/2024 0520   RBC 4.53 02/27/2024 0520   HGB 12.6 (L) 02/27/2024 0520   HGB 11.5 (L) 02/19/2024 1522   HCT 39.6 02/27/2024 0520   HCT 35.7 (L) 02/19/2024 1522   PLT 275 02/27/2024 0520   PLT 241 02/19/2024 1522   MCV 87.4 02/27/2024 0520   MCV 90 02/19/2024 1522   MCV 91 02/16/2012 2013   MCH 27.8 02/27/2024 0520   MCHC 31.8 02/27/2024 0520   RDW 14.6 02/27/2024 0520   RDW 13.9 02/19/2024 1522   RDW 13.0 02/16/2012 2013   LYMPHSABS 1.9 02/19/2024 1522   MONOABS 0.5 12/04/2023 1141   EOSABS 0.3 02/19/2024 1522   BASOSABS 0.0 02/19/2024 1522    BMET    Component Value Date/Time   NA 139 02/27/2024 0520   NA 131 (L) 02/19/2024 1522   NA 137 02/16/2012 2013   K 3.8 02/27/2024 0520   K 3.8 02/16/2012 2013   CL 99 02/27/2024 0520   CL 102 02/16/2012 2013   CO2 28 02/27/2024 0520   CO2 25 02/16/2012 2013   GLUCOSE 135 (H) 02/27/2024 0520   GLUCOSE 154 (H) 02/16/2012 2013   BUN 21 02/27/2024 0520   BUN 21 02/19/2024 1522   BUN 10 02/16/2012 2013   CREATININE 0.66 02/27/2024 0520   CREATININE 0.72 02/16/2012 2013   CALCIUM  9.3 02/27/2024 0520   CALCIUM  9.6 02/16/2012 2013   GFRNONAA >60 02/27/2024 0520   GFRNONAA >60 02/16/2012 2013   GFRAA >60 12/09/2018 0929   GFRAA >60 02/16/2012 2013    INR    Component Value Date/Time   INR 1.3 (H) 12/12/2023 0816     Intake/Output Summary (Last 24 hours) at 02/27/2024 0756 Last data filed at 02/26/2024 1654 Gross per 24 hour  Intake --  Output 1850 ml  Net -1850 ml     Assessment/Plan:  78 y.o. male is s/p *** * No  surgery found *  *** DVT prophylaxis:  ***   Manuel [name] [facility] 02/27/2024 7:56 AM

## 2024-02-27 NOTE — Progress Notes (Signed)
 Brief ID Note Communication from primary team and vascular Wound surface cx done 11/6 with GBS and anaerboes. Cx done deeper today is pending He is here mainly for CHF exacerabtion and no evidence of acute infection  Rec  Start linezolid for now pending culture results FU Dr [name] next week Continue wound care

## 2024-02-28 ENCOUNTER — Other Ambulatory Visit: Payer: Self-pay

## 2024-02-28 DIAGNOSIS — I739 Peripheral vascular disease, unspecified: Secondary | ICD-10-CM | POA: Diagnosis not present

## 2024-02-28 DIAGNOSIS — T148XXD Other injury of unspecified body region, subsequent encounter: Secondary | ICD-10-CM | POA: Diagnosis not present

## 2024-02-28 DIAGNOSIS — E1142 Type 2 diabetes mellitus with diabetic polyneuropathy: Secondary | ICD-10-CM

## 2024-02-28 DIAGNOSIS — I2510 Atherosclerotic heart disease of native coronary artery without angina pectoris: Secondary | ICD-10-CM | POA: Diagnosis not present

## 2024-02-28 DIAGNOSIS — E782 Mixed hyperlipidemia: Secondary | ICD-10-CM | POA: Diagnosis not present

## 2024-02-28 DIAGNOSIS — I5043 Acute on chronic combined systolic (congestive) and diastolic (congestive) heart failure: Secondary | ICD-10-CM

## 2024-02-28 DIAGNOSIS — I509 Heart failure, unspecified: Secondary | ICD-10-CM | POA: Diagnosis not present

## 2024-02-28 DIAGNOSIS — I25118 Atherosclerotic heart disease of native coronary artery with other forms of angina pectoris: Secondary | ICD-10-CM | POA: Diagnosis not present

## 2024-02-28 DIAGNOSIS — N400 Enlarged prostate without lower urinary tract symptoms: Secondary | ICD-10-CM

## 2024-02-28 DIAGNOSIS — I255 Ischemic cardiomyopathy: Secondary | ICD-10-CM | POA: Diagnosis not present

## 2024-02-28 LAB — GLUCOSE, CAPILLARY
Glucose-Capillary: 177 mg/dL — ABNORMAL HIGH (ref 70–99)
Glucose-Capillary: 168 mg/dL — ABNORMAL HIGH (ref 70–99)

## 2024-02-28 LAB — BASIC METABOLIC PANEL WITH GFR
Anion gap: 12 (ref 5–15)
BUN: 18 mg/dL (ref 8–23)
CO2: 27 mmol/L (ref 22–32)
Calcium: 8.9 mg/dL (ref 8.9–10.3)
Chloride: 98 mmol/L (ref 98–111)
Creatinine, Ser: 0.86 mg/dL (ref 0.61–1.24)
GFR, Estimated: >60 mL/min (ref >60)
Glucose, Bld: 156 mg/dL — ABNORMAL HIGH (ref 70–99)
Potassium: 3.8 mmol/L (ref 3.5–5.1)
Sodium: 137 mmol/L (ref 135–145)

## 2024-02-28 LAB — AEROBIC CULTURE W GRAM STAIN (SUPERFICIAL SPECIMEN)

## 2024-02-28 LAB — CBC
HCT: 35.1 % — ABNORMAL LOW (ref 39.0–52.0)
Hemoglobin: 11.3 g/dL — ABNORMAL LOW (ref 13.0–17.0)
MCH: 27.7 pg (ref 26.0–34.0)
MCHC: 32.2 g/dL (ref 30.0–36.0)
MCV: 86 fL (ref 80.0–100.0)
Platelets: 264 K/uL (ref 150–400)
RBC: 4.08 MIL/uL — ABNORMAL LOW (ref 4.22–5.81)
RDW: 14.6 % (ref 11.5–15.5)
WBC: 7.6 K/uL (ref 4.0–10.5)
nRBC: 0 % (ref 0.0–0.2)

## 2024-02-28 MED ORDER — FUROSEMIDE 40 MG PO TABS
40.0000 mg | ORAL_TABLET | Freq: Every day | ORAL | 2 refills | Status: AC
  Filled 2024-02-28: qty 30, 30d supply, fill #0

## 2024-02-28 MED ORDER — NYSTATIN 100000 UNIT/GM EX POWD
Freq: Two times a day (BID) | CUTANEOUS | 0 refills | Status: AC
  Filled 2024-02-28: qty 15, 5d supply, fill #0

## 2024-02-28 MED ORDER — FUROSEMIDE 40 MG PO TABS
40.0000 mg | ORAL_TABLET | Freq: Every day | ORAL | Status: DC
  Administered 2024-02-28: 40 mg via ORAL
  Filled 2024-02-28: qty 1

## 2024-02-28 MED ORDER — SPIRONOLACTONE 25 MG PO TABS
12.5000 mg | ORAL_TABLET | Freq: Every day | ORAL | 2 refills | Status: AC
  Filled 2024-02-28: qty 30, 60d supply, fill #0

## 2024-02-28 MED ORDER — LINEZOLID 600 MG PO TABS
600.0000 mg | ORAL_TABLET | Freq: Two times a day (BID) | ORAL | 0 refills | Status: DC
  Filled 2024-02-28: qty 14, 7d supply, fill #0

## 2024-02-28 MED ORDER — POTASSIUM CHLORIDE CRYS ER 10 MEQ PO TBCR
10.0000 meq | EXTENDED_RELEASE_TABLET | Freq: Two times a day (BID) | ORAL | 0 refills | Status: DC
  Filled 2024-02-28: qty 30, 15d supply, fill #0

## 2024-02-28 NOTE — Evaluation (Signed)
 Physical Therapy Evaluation Patient Details Name: Manuel [name] MRN: [id number] DOB: 1946/02/13 Today's Date: 02/28/2024  History of Present Illness  Manuel [name] is a 78 y.o. male with medical history significant for DM, HTN, OSA,  HFrEF 25-30%,  CAD s/p multiple stents,carotid artery stenosis,, extensive PAD s/p stent placement right femoral artery complicated by femoral artery rupture/repair/wound dehiscence/serratia infection s/p ertapenem /Bactrim  (wound vac dc'd 02/23/24) being admitted 02/25/2024 with a CHF exacerbation in the setting of recent medication adjustment as well as concerns for infection of right groin wound.  Clinical Impression  78 yo Male admitted with recent increase in shortness of breath and wound on right groin. Patient has a complex medical history with multiple hospital admissions in last year. He is s/p RLE BKA with right prosthetic. He reports being mostly modified independent in self care prior to admittance. He was using RW for ambulation at baseline with right prosthetic. Currently he demonstrates modified independent in bed mobility. He demonstrates functional strength in BLE. He is independent in donning right prosthetic. He requires CGA for sit to stand transfers with RW for steady. He ambulates with RW and RLE prosthetic. He ambulates with reciprocal gait pattern, heavy lean on RW with decreased gait speed. Vitals monitored, At rest, SPO2 97%, after walking 30 feet, Spo2 dropped to 93%; after walking 100 feet SPo2 dropped to 86% on room air. It rebounded to 91-92% after 20 sec of deep breathing. Patient does report increased fatigue after walking short distance but denies any shortness of breath. He would benefit from skilled PT intervention to improve mobility and cardiovascular conditioning.         If plan is discharge home, recommend the following: A little help with walking and/or transfers;A little help with bathing/dressing/bathroom;Assistance with  cooking/housework;Assist for transportation;Help with stairs or ramp for entrance   Can travel by private vehicle        Equipment Recommendations None recommended by PT  Recommendations for Other Services       Functional Status Assessment Patient has had a recent decline in their functional status and demonstrates the ability to make significant improvements in function in a reasonable and predictable amount of time.     Precautions / Restrictions Precautions Precautions: Fall Restrictions Weight Bearing Restrictions Per Provider Order: No Other Position/Activity Restrictions: right BKA, has prosthetic      Mobility  Bed Mobility Overal bed mobility: Modified Independent             General bed mobility comments: able to transition supine<>sitting mod I, will use bed rails and elevated head of bed    Transfers Overall transfer level: Needs assistance Equipment used: Rolling walker (2 wheels) Transfers: Sit to/from Stand Sit to Stand: Contact guard assist           General transfer comment: demonstrates good hand placement, requires CGA to steady;    Ambulation/Gait Ambulation/Gait assistance: Contact guard assist Gait Distance (Feet): 100 Feet Assistive device: Rolling walker (2 wheels) Gait Pattern/deviations: Step-through pattern, Trunk flexed Gait velocity: decreased     General Gait Details: pt ambulates with step through pattern, normal base of support, heavy lean on RW  Stairs            Wheelchair Mobility     Tilt Bed    Modified Rankin (Stroke Patients Only)       Balance Overall balance assessment: Needs assistance Sitting-balance support: No upper extremity supported, Feet supported Sitting balance-Leahy Scale: Good     Standing balance support:  Bilateral upper extremity supported, Reliant on assistive device for balance Standing balance-Leahy Scale: Fair Standing balance comment: able to stand with RW with close stand by  assist; does lean heavily on RW for support                             Pertinent Vitals/Pain Pain Assessment Pain Assessment: No/denies pain    Home Living Family/patient expects to be discharged to:: Private residence Living Arrangements: Spouse/significant other Available Help at Discharge: Family;Available PRN/intermittently Type of Home: House Home Access: Ramped entrance       Home Layout: Able to live on main level with bedroom/bathroom;Two level;Laundry or work area in [employer] Equipment: Agricultural Consultant (2 wheels);BSC/3in1;Wheelchair - manual;Cane - single point;Tub bench;Crutches Additional Comments: RLE prosthesis    Prior Function Prior Level of Function : Independent/Modified Independent             Mobility Comments: Pt reports he was ambulatory with RW  with RLE prosthesis. ADLs Comments: Pt reports that he is independent in self care ADLs.     Extremity/Trunk Assessment   Upper Extremity Assessment Upper Extremity Assessment: Overall WFL for tasks assessed    Lower Extremity Assessment Lower Extremity Assessment: Overall WFL for tasks assessed;RLE deficits/detail RLE Deficits / Details: RLE BKA, has prosthetic, independent in donning prosthetic; good skin integrity of residual limb    Cervical / Trunk Assessment Cervical / Trunk Assessment: Normal  Communication   Communication Communication: No apparent difficulties    Cognition Arousal: Alert Behavior During Therapy: WFL for tasks assessed/performed   PT - Cognitive impairments: No apparent impairments                       PT - Cognition Comments: oriented x4 Following commands: Intact       Cueing Cueing Techniques: Verbal cues     General Comments General comments (skin integrity, edema, etc.): vitals monitored during ambulation; on room, at rest: Spo2 97%, after walking 30 feet, SPo2 dropped to 93% but no shortness of breath reported; Pt ambulated 100 feet  and SPo2 dropped to 86%, rebounded to 91-92% within 20 sec of deep breathing; Pt denies shortness of breath throughout session;    Exercises Other Exercises Other Exercises: Educated patient in role of PT and recommendations; Patient very motivated to return home. Verbalizes understanding of good residual limb management and positioning safety   Assessment/Plan    PT Assessment Patient needs continued PT services  PT Problem List Decreased activity tolerance;Decreased balance;Decreased mobility;Decreased skin integrity       PT Treatment Interventions DME instruction;Balance training;Gait training;Functional mobility training;Therapeutic activities;Therapeutic exercise;Patient/family education    PT Goals (Current goals can be found in the Care Plan section)  Acute Rehab PT Goals Patient Stated Goal: to go home PT Goal Formulation: With patient Time For Goal Achievement: 03/13/24 Potential to Achieve Goals: Good    Frequency Min 1X/week     Co-evaluation               AM-PAC PT 6 Clicks Mobility  Outcome Measure Help needed turning from your back to your side while in a flat bed without using bedrails?: None Help needed moving from lying on your back to sitting on the side of a flat bed without using bedrails?: None Help needed moving to and from a bed to a chair (including a wheelchair)?: A Little Help needed standing up from a chair using your  arms (e.g., wheelchair or bedside chair)?: A Little Help needed to walk in hospital room?: A Little Help needed climbing 3-5 steps with a railing? : A Lot 6 Click Score: 19    End of Session Equipment Utilized During Treatment: Gait belt Activity Tolerance: Patient tolerated treatment well;No increased pain Patient left: in chair;with call bell/phone within reach;with chair alarm set Nurse Communication: Mobility status PT Visit Diagnosis: Other abnormalities of gait and mobility (R26.89)    Time: 1110-1155 PT Time  Calculation (min) (ACUTE ONLY): 45 min   Charges:   PT Evaluation $PT Eval Moderate Complexity: 1 Mod   PT General Charges $$ ACUTE PT VISIT: 1 Visit          Monifah Freehling PT, DPT 02/28/2024, 1:31 PM

## 2024-02-28 NOTE — Plan of Care (Signed)
  Problem: Fluid Volume: Goal: Ability to maintain a balanced intake and output will improve Outcome: Progressing   Problem: Health Behavior/Discharge Planning: Goal: Ability to manage health-related needs will improve Outcome: Progressing   Problem: Skin Integrity: Goal: Risk for impaired skin integrity will decrease Outcome: Progressing   Problem: Health Behavior/Discharge Planning: Goal: Ability to manage health-related needs will improve Outcome: Progressing   Problem: Activity: Goal: Risk for activity intolerance will decrease Outcome: Progressing   Problem: Nutrition: Goal: Adequate nutrition will be maintained Outcome: Progressing   Problem: Pain Managment: Goal: General experience of comfort will improve and/or be controlled Outcome: Progressing

## 2024-02-28 NOTE — TOC CM/SW Note (Signed)
 Walking oximetry test  Room air at rest: 97% Room air while ambulating: 86%-92%  Oxygen dropped down to 92% and then 86% during ambulation  2L Interior while ambulating: 92%.

## 2024-02-28 NOTE — Progress Notes (Signed)
 Per patient's wife, she spoke directly to [employer] who reported they would deliver the DME (home oxygen) to their home. Neither the patient or wife wish to wait for the oxygen to be delivered to bedside before discharge. Informed attending Amaryllis Dare, who said this was fine. Attending reported that patient can be discharged before the oxygen arrives at bedside and the oxygen can be delivered to the patient at home.

## 2024-02-28 NOTE — Progress Notes (Signed)
  Progress Note  Patient Name: Manuel [name] Date of Encounter: 02/28/2024 [facility] Cardiologist: Lonni Hanson, MD   Interval Summary    UOP -2.8L He is off O2. He is ready to go home. No chest pain of SOB.   Vital Signs Vitals:   02/27/24 2108 02/28/24 0119 02/28/24 0500 02/28/24 0524  BP: 134/68 121/62  111/60  Pulse: 71 77  71  Resp: 20 17  19   Temp: 98.2 F (36.8 C) 98.2 F (36.8 C)  98.1 F (36.7 C)  TempSrc:      SpO2: 91% 96%  91%  Weight:   80 kg   Height:        Intake/Output Summary (Last 24 hours) at 02/28/2024 0751 Last data filed at 02/28/2024 0525 Gross per 24 hour  Intake 720 ml  Output 2800 ml  Net -2080 ml      02/28/2024    5:00 AM 02/27/2024    5:00 AM 02/26/2024    5:30 AM  Last 3 Weights  Weight (lbs) 176 lb 5.9 oz 182 lb 1.6 oz 184 lb 4.9 oz  Weight (kg) 80 kg 82.6 kg 83.6 kg      Telemetry/ECG  NSR HR 60 to 70s - Personally Reviewed  Physical Exam  GEN: No acute distress.   Neck: No JVD Cardiac: RRR, no murmurs, rubs, or gallops.  Respiratory: Clear to auscultation bilaterally. GI: Soft, nontender, non-distended  MS: No edema  Assessment & Plan   Acute on chronic systolic heart failure ICM -Presented with DOE, orthopnea, BNP >2000 and CXR with CHF. GDMT had been stopped due to hypotension, then he saw PCP for SOB/orthopnea who started Lasix  20mg  daily. 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

## 2024-02-28 NOTE — Plan of Care (Signed)

## 2024-02-28 NOTE — Discharge Summary (Signed)
 Physician Discharge Summary   Patient: Manuel [name] MRN: [id number] DOB: 10-20-1945  Admit date:     02/25/2024  Discharge date: 02/28/24  Discharge Physician: Amaryllis Dare   PCP: [name], [name], MD   Recommendations at discharge:  Please obtain CBC and BMP on follow-up Patient was given oxygen to use 2 L with ambulation, reassess and make changes as needed. Patient has been given 1 week of linezolid for GBS growing in wound, need to follow-up with his vascular surgeon and ID for further assistance. Will follow-up with cardiology.  Follow-up with primary care provider  Discharge Diagnoses: Principal Problem:   CHF exacerbation (HCC) Active Problems:   Acute on chronic HFrEF (heart failure with reduced ejection fraction) (HCC)   Delayed wound healing, right groin   PAD with history of prior angioplasty and stent placement (peripheral artery disease)   Coronary artery disease with history of myocardial infarction without history of CABG   Diabetic polyneuropathy associated with type 2 diabetes mellitus (HCC)   Essential hypertension   S/P angioplasty with stent   BPH (benign prostatic hyperplasia)   S/P BKA (below knee amputation), 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 *  Hospital Course: Manuel [name] is a 78 y.o. male with medical history significant for DM, HTN, OSA,  HFrEF 25-30%,  CAD s/p multiple stents,carotid artery stenosis,, extensive PAD s/p stent placement right femoral artery complicated by femoral artery rupture/repair/wound dehiscence/serratia infection s/p ertapenem /Bactrim  (wound vac dc'd 02/23/24) being admitted with a CHF exacerbation in the setting of recent medication adjustment as well as concerns for infection of right groin wound.  Regarding CHF exacerbation, patient states that about 3 weeks prior his  PCP noted his BP to be low and they held his spironolactone  and Entresto .  Since then he noted swelling to his left leg and then progressively worsening dyspnea with exertion, now with rest as well as orthopnea.   his wound VAC was removed on 02/23/2024 and since then he has noticed increased  wetness and drainage to the area. He has no fever or chills. He last saw ID 02/10/2024 when photos were taken showing good healing. He has not been on antibiotics for several weeks, except for doxycycline  that was started the day prior for sinusitis..   Assessment and Plan: Delayed wound healing, right groin Possible recurrent wound infection right groin  History of wound dehiscence right groin s/p femoral artery repair (12/2023) Wound VAC removed on 02/23/2024 Patient reports moisture and oozing-previously treated with Bactrim  as well as fluconazole-followed by ID Repeat cultures growing GBS, another deeper culture with gram-positive cocci likely the same-pending final results. ID switched him to linezolid and he was given 1 week of linezolid and need to have a follow-up with vascular surgery and ID for further assistance.   Acute on chronic HFrEF (heart failure with reduced ejection fraction) (HCC) Ischemic cardiomyopathy, EF 20 to 25%, (12/05/2023) History of NSVT Suspect related to medication adjustment(Entresto  and spironolactone  DC'd by PCP due to hypotension 3 weeks prior) Patient presents with dyspnea on exertion, orthopnea, BNP 2345 and chest x-ray consistent with CHF Patient was followed by cardiology and was treated with IV Lasix  which was switched with p.o. Lasix  at 40 mg daily.  Low-dose spironolactone  was also added and he will resume his home Entresto  and Jardiance.   Acute hypoxic respiratory  failure.  Likely secondary to acute on chronic HFrEF.  No baseline oxygen use.  Does not needing oxygen while resting but does desaturate up to 86% with ambulation so he was given oxygen to use with  ambulation and need to have a follow-up with his providers for further assistance.  PAD with history of prior angioplasty and stent placement (peripheral artery disease) 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 with phantom pain Carotid artery stenosis Continue aspirin  , plavix  and atorvastatin   Coronary artery disease with history of myocardial infarction without history of CABG No complaints of chest pain, first troponin 15 and EKG nonacute Continue aspirin , atorvastatin , carvedilol  and NTG as needed  Essential hypertension Continue carvedilol , Lasix , Entresto  and spironolactone   Diabetic polyneuropathy associated with type 2 diabetes mellitus (HCC) Sliding scale insulin  coverage Continue gabapentin   Sleep apnea CPAP if desired  History of alcohol  abuse Previous heavy drinker now drinks occasionally 1-2 beers a day  BPH (benign prostatic hyperplasia) No acute concerns  Consultants: Cardiology.  Infectious disease Procedures performed: Blood work Disposition: Home health Diet recommendation: Heart healthy/carb modified  Discharge Diet Orders (From admission, onward)     Start     Ordered   02/28/24 0000  Diet - low sodium heart healthy        02/28/24 1216            DISCHARGE MEDICATION: Allergies as of 02/28/2024   No Known Allergies      Medication List     STOP taking these medications    doxycycline  100 MG capsule Commonly known as: VIBRAMYCIN    escitalopram  10 MG tablet Commonly known as: LEXAPRO        TAKE these medications    artificial tears ophthalmic solution Place 1 drop into both eyes as needed for dry eyes.   aspirin  EC 81 MG tablet Take 1 tablet (81 mg total) by mouth daily.   atorvastatin  80 MG tablet Commonly known as: LIPITOR  Take 1 tablet (80 mg total) by mouth at bedtime.   benzonatate 100 MG capsule Commonly known as: TESSALON Take 100 mg by mouth 3 (three) times daily as needed for cough.   carvedilol  3.125 MG tablet Commonly known  as: COREG  Take 1 tablet (3.125 mg total) by mouth 2 (two) times daily with a meal.   clopidogrel  75 MG tablet Commonly known as: PLAVIX  Take 1 tablet (75 mg total) by mouth daily.   dapagliflozin  propanediol 10 MG Tabs tablet Commonly known as: FARXIGA  Take 1 tablet (10 mg total) by mouth daily.   ferrous sulfate  325 (65 FE) MG EC tablet Take 325 mg by mouth daily with breakfast.   FLUoxetine 20 MG capsule Commonly known as: PROZAC Take 1 capsule (20 mg total) by mouth daily.   fluticasone  50 MCG/ACT nasal spray Commonly known as: FLONASE  Place 1 spray into both nostrils 2 (two) times daily as needed for allergies or rhinitis.   folic acid  1 MG tablet Commonly known as: FOLVITE  Take 1 tablet (1 mg total) by mouth daily.   furosemide  40 MG tablet Commonly known as: LASIX  Take 1 tablet (40 mg total) by mouth daily. Start taking on: February 29, 2024 What changed:  medication strength how much to take   gabapentin  100 MG capsule Commonly known as: NEURONTIN  Take 1 capsule (100 mg total) by mouth at bedtime.   insulin  aspart protamine  - aspart (70-30) 100 UNIT/ML FlexPen Commonly known as: NOVOLOG  70/30 MIX Inject 3 Units into the skin 2 (two) times daily with a meal.  linezolid 600 MG tablet Commonly known as: ZYVOX Take 1 tablet (600 mg total) by mouth every 12 (twelve) hours for 7 days.   loratadine  10 MG tablet Commonly known as: CLARITIN  Take 1 tablet (10 mg total) by mouth daily as needed for allergies.   nitroGLYCERIN  0.4 MG SL tablet Commonly known as: Nitrostat  Place 1 tablet (0.4 mg total) under the tongue every 5 (five) minutes as needed for chest pain. Max: 3 tablets per day   nystatin  powder Commonly known as: MYCOSTATIN /NYSTOP  Apply topically 2 (two) times daily.   pantoprazole  40 MG tablet Commonly known as: PROTONIX  Take 1 tablet (40 mg total) by mouth daily.   polyethylene glycol 17 g packet Commonly known as: MIRALAX  / GLYCOLAX  Take 17 g  by mouth daily.   potassium chloride  10 MEQ tablet Commonly known as: KLOR-CON  M Take 1 tablet (10 mEq total) by mouth 2 (two) times daily.   sacubitril -valsartan  24-26 MG Commonly known as: ENTRESTO  Take 1 tablet by mouth every 12 (twelve) hours.   spironolactone  25 MG tablet Commonly known as: ALDACTONE  Take 0.5 tablets (12.5 mg total) by mouth daily. Start taking on: February 29, 2024   thiamine  100 MG tablet Commonly known as: Vitamin B-1 Take 1 tablet (100 mg total) by mouth daily.   traZODone  100 MG tablet Commonly known as: DESYREL  Take 1 tablet (100 mg total) by mouth at bedtime.               Durable Medical Equipment  (From admission, onward)           Start     Ordered   02/28/24 1206  For home use only DME oxygen  Once       Question Answer Comment  Length of Need Lifetime   Mode or (Route) Nasal cannula   Liters per Minute 2   Frequency Continuous (stationary and portable oxygen unit needed)   Oxygen conserving device Yes   Oxygen delivery system: Gas   Oxygen delivery system: Portable concentrator (POC)      02/28/24 1206              Discharge Care Instructions  (From admission, onward)           Start     Ordered   02/28/24 0000  Discharge wound care:       Comments: Apply a piece of Aquacel Soila # [phone]) to right groin Q day, then cover with foam dressing.  Change foam dressing Q 3 days or PRN soiling.   02/28/24 1216            Follow-up Information     [facility]. Go on 03/08/2024.   Specialty: Cardiology Why: Hospital Follow-Up 03/08/24 @ 9:30 Please bring all medications to follow-up appointment [facility] [address]  Free Valet Parking at the door Contact information: [address] [place]  [address] [phone]        [name], Devere POUR, MD. Schedule an appointment as soon as possible for a visit in 1 week(s).    Specialty: Family Medicine Contact information: [address] [place] [address] [phone]         [name] Bodily, MD. Schedule an appointment as soon as possible for a visit.   Specialty: Infectious Diseases Contact information: [address] [place] [address] [phone]                Discharge Exam: Fredricka Weights  02/26/24 0530 02/27/24 0500 02/28/24 0500  Weight: 83.6 kg 82.6 kg 80 kg   General.  Well-developed elderly man, in no acute distress. Pulmonary.  Lungs clear bilaterally, normal respiratory effort. CV.  Regular rate and rhythm, no JVD, rub or murmur. Abdomen.  Soft, nontender, nondistended, BS positive. CNS.  Alert and oriented .  No focal neurologic deficit. Extremities.  Right BKA, no edema left Psychiatry.  Judgment and insight appears normal.   Condition at discharge: stable  The results of significant diagnostics from this hospitalization (including imaging, microbiology, ancillary and laboratory) are listed below for reference.   Imaging Studies: DG Chest 2 View Result Date: 02/25/2024 EXAM: 2 VIEW(S) XRAY OF THE CHEST 02/25/2024 05:10:00 PM COMPARISON: 02/19/2024 CLINICAL HISTORY: Shortness of breath. FINDINGS: LUNGS AND PLEURA: No focal pulmonary opacity. Interstitial prominence likely reflects interstitial edema. Small bilateral effusions. No pneumothorax. HEART AND MEDIASTINUM: Cardiomegaly with vascular congestion. BONES AND SOFT TISSUES: No acute osseous abnormality. IMPRESSION: 1. Cardiomegaly with vascular congestion and interstitial edema. 2. Small bilateral pleural effusions. Electronically signed by: Franky Crease MD 02/25/2024 05:23 PM EST RP Workstation: [id number]   DG Chest 2 View Result Date: 02/20/2024 CLINICAL DATA:  sob EXAM: CHEST - 2 VIEW COMPARISON:  01/17/2024 FINDINGS: Lower lung volumes. No focal airspace consolidation, pleural effusion, or pneumothorax. No cardiomegaly. Tortuous aorta with aortic  atherosclerosis. No acute fracture or destructive lesions. Multilevel thoracic osteophytosis. IMPRESSION: Low lung volumes.  Otherwise, no acute cardiopulmonary abnormality. Electronically Signed   By: Rogelia Myers M.D.   On: 02/20/2024 14:43    Microbiology: Results for orders placed or performed during the hospital encounter of 02/25/24  Aerobic Culture w Gram Stain (superficial specimen)     Status: None   Collection Time: 02/26/24  1:29 AM   Specimen: Groin; Wound  Result Value Ref Range Status   Specimen Description   Final    GROIN Performed at [facility], [address], [place], [place] [address]    Special Requests   Final    NONE Performed at [facility], [address] [place], [place] [address]    Gram Stain   Final    FEW WBC PRESENT,BOTH PMN AND MONONUCLEAR FEW GRAM POSITIVE COCCI    Culture   Final    ABUNDANT GROUP B STREP(S.AGALACTIAE)ISOLATED TESTING AGAINST S. AGALACTIAE NOT ROUTINELY PERFORMED DUE TO PREDICTABILITY OF AMP/PEN/VAN SUSCEPTIBILITY. ABUNDANT DIPHTHEROIDS(CORYNEBACTERIUM SPECIES) Standardized susceptibility testing for this organism is not available. Performed at [facility], [address] [place], [place] [address]    Report Status 02/28/2024 FINAL  Final  Aerobic Culture w Gram Stain (superficial specimen)     Status: None (Preliminary result)   Collection Time: 02/27/24  5:15 PM   Specimen: Wound  Result Value Ref Range Status   Specimen Description   Final    WOUND Performed at [facility], [address], [place], [place] [address]    Special Requests   Final    RIGHT GROIN Performed at [facility], [address] [place], [place] [address]    Gram Stain   Final    FEW WBC PRESENT,BOTH PMN AND MONONUCLEAR FEW GRAM POSITIVE COCCI    Culture   Final    TOO YOUNG TO READ Performed at [facility], [address] [place], [place] [address]    Report Status PENDING   Incomplete    Labs: CBC: Recent Labs  Lab 02/25/24 1641 02/26/24 0437 02/27/24 0520 02/28/24 9389  WBC 7.1 5.9 9.2 7.6  HGB 11.3* 10.1* 12.6* 11.3*  HCT 35.1* 31.8* 39.6 35.1*  MCV 87.5 89.1 87.4 86.0  PLT 268 251 275 264   Basic Metabolic Panel: Recent Labs  Lab 02/25/24 1641 02/25/24 1846 02/26/24 0437 02/27/24 0520 02/28/24 0610  NA 135  --  137 139 137  K 4.7  --  3.7 3.8 3.8  CL 99  --  102 99 98  CO2 23  --  25 28 27   GLUCOSE 198*  --  145* 135* 156*  BUN 21  --  23 21 18   CREATININE 1.01  --  0.95 0.66 0.86  CALCIUM  9.1  --  8.5* 9.3 8.9  MG  --  2.2  --   --   --    Liver Function Tests: No results for input(s): AST, ALT, ALKPHOS, BILITOT, PROT, ALBUMIN in the last 168 hours. CBG: Recent Labs  Lab 02/27/24 0757 02/27/24 1132 02/27/24 1656 02/27/24 2153 02/28/24 0833  GLUCAP 127* 146* 157* 163* 177*    Discharge time spent: greater than 30 minutes.  This record has been created using Conservation officer, historic buildings. Errors have been sought and corrected,but may not always be located. Such creation errors do not reflect on the standard of care.   Signed: Amaryllis Dare, MD [facility] 02/28/2024

## 2024-02-28 NOTE — TOC Transition Note (Signed)
 Transition of Care [facility]) - Discharge Note   Patient Details  Name: Manuel [name] MRN: [id number] Date of Birth: [birth year]  Transition of [facility]) CM/SW Contact:  Victory Jackquline RAMAN, RN Phone Number: 02/28/2024, 12:55 PM   Clinical Narrative:    9:59 am: RNCM received a call from [place] with [employer] informing me that they would not be able to fill the oxygen order for this patient because they were out of network. MD made aware. MD request that the bedside nurse do a Walking Sat Test.  RNCM received a message via secure chat from the MD stating that she had entered in the DME and HH orders. Email sent to [employer] for DME (Oxygen). RNCM will continue to follow for discharge planning needs.  12:47pm: Spoke with [name] @ [phone] and informed her of the oxygen order that will be delivered to the patients bedside. She informed me that the patient is currently working with [facility] and has PT and RN coming in now. HH services to restart with [facility] once patient is discharged. I informed her that the MD was ordering HH/PT/OT/RN. Wife states that she will be picking her husband up this afternoon. Pt has discharge orders, no further concerns. RNCM signing off.   Final next level of care: Home w Home Health Services Barriers to Discharge: Barriers Resolved   Patient Goals and CMS Choice            Discharge Placement                Patient to be transferred to facility by: Wife Name of family member notified: [name] Patient and family notified of of transfer: 02/28/24  Discharge Plan and Services Additional resources added to the After Visit Summary for     Discharge Planning Services: CM Consult            DME Arranged: Oxygen DME Agency: [facility] Date DME Agency Contacted: 02/28/24   Representative spoke with at DME Agency: Email sent for Oxygen HH Arranged: PT, RN, OT HH Agency:  ([facility]) Date HH Agency Contacted: 02/26/24   Representative spoke  with at [facility] Agency: Shaun  Social Drivers of Health (SDOH) Interventions SDOH Screenings   Food Insecurity: No Food Insecurity (02/26/2024)  Housing: Low Risk  (02/26/2024)  Transportation Needs: No Transportation Needs (02/26/2024)  Utilities: Patient Declined (02/26/2024)  Depression (PHQ2-9): High Risk (02/04/2024)  Financial Resource Strain: Low Risk  (07/11/2023)  Social Connections: Unknown (02/26/2024)  Recent Concern: Social Connections - Moderately Isolated (01/17/2024)  Tobacco Use: Low Risk  (02/25/2024)  Recent Concern: Tobacco Use - Medium Risk (02/25/2024)   Received from [facility]     Readmission Risk Interventions    02/27/2024   11:56 AM 12/05/2023    3:09 PM 07/09/2023    8:30 PM  Readmission Risk Prevention Plan  Transportation Screening Complete Complete Complete  PCP or Specialist Appt within 3-5 Days --    HRI or Home Care Consult --    Social Work Consult for Recovery Care Planning/Counseling Complete    Palliative Care Screening Not Applicable    Medication Review Ocean[facility])  Complete Complete  PCP or Specialist appointment within 3-5 days of discharge  Complete   HRI or Home Care Consult   Complete  SW Recovery Care/Counseling Consult  Complete Complete  Palliative Care Screening  Not Applicable Not Applicable  Skilled Nursing Facility  Not Applicable Not Applicable

## 2024-02-28 NOTE — Progress Notes (Signed)
 Walking oximetry test  Room air at rest: 97% Room air while ambulating: 86%-92%  Oxygen dropped down to 92% and then 86% during ambulation  2L Interior while ambulating: 92%.

## 2024-02-28 NOTE — Progress Notes (Signed)
 DME oxygen delivered to bedside before discharge

## 2024-02-29 ENCOUNTER — Telehealth: Payer: Self-pay | Admitting: Family Medicine

## 2024-02-29 LAB — AEROBIC CULTURE W GRAM STAIN (SUPERFICIAL SPECIMEN)

## 2024-02-29 NOTE — Telephone Encounter (Signed)
 Spoke with wife, Evlyn.  He could not get the oxygen as an outpatient.  He went to UC on Tuesday and has a sinus infection and got medication for a sinus infection.  He got SOB after lying down and they went to the hospital.  He got lasix  IV.  He a was admitted and got fluid diuresed.  He was discharged on lasix  20mg  and a potassium supplement.  He is back on Entresto .  He is doing better since he got back on his Entresto  medication. They sent him home from the hospital with oxygen.  He sleeps with the oxygen on.  He got an oxygen generator.  Advised that he needs to drink plenty of water  so that he does not get dehydrated and develop low blood pressure.  Spoke with Evlyn and Bruna.

## 2024-03-04 ENCOUNTER — Encounter (INDEPENDENT_AMBULATORY_CARE_PROVIDER_SITE_OTHER)

## 2024-03-04 ENCOUNTER — Ambulatory Visit (INDEPENDENT_AMBULATORY_CARE_PROVIDER_SITE_OTHER): Admitting: Nurse Practitioner

## 2024-03-04 ENCOUNTER — Ambulatory Visit (INDEPENDENT_AMBULATORY_CARE_PROVIDER_SITE_OTHER): Admitting: Vascular Surgery

## 2024-03-05 ENCOUNTER — Telehealth: Payer: Self-pay | Admitting: Family

## 2024-03-05 ENCOUNTER — Ambulatory Visit (INDEPENDENT_AMBULATORY_CARE_PROVIDER_SITE_OTHER): Admitting: Family Medicine

## 2024-03-05 VITALS — BP 120/66 | HR 75 | Temp 98.4°F | Resp 18

## 2024-03-05 DIAGNOSIS — I5043 Acute on chronic combined systolic (congestive) and diastolic (congestive) heart failure: Secondary | ICD-10-CM | POA: Diagnosis not present

## 2024-03-05 DIAGNOSIS — F339 Major depressive disorder, recurrent, unspecified: Secondary | ICD-10-CM | POA: Diagnosis not present

## 2024-03-05 DIAGNOSIS — I10 Essential (primary) hypertension: Secondary | ICD-10-CM

## 2024-03-05 DIAGNOSIS — R531 Weakness: Secondary | ICD-10-CM

## 2024-03-05 DIAGNOSIS — Z23 Encounter for immunization: Secondary | ICD-10-CM

## 2024-03-05 DIAGNOSIS — J3489 Other specified disorders of nose and nasal sinuses: Secondary | ICD-10-CM

## 2024-03-05 NOTE — Telephone Encounter (Signed)
 Called to confirm/remind patient of [facility].   Appointment:   [x] Confirmed  [] Left mess   [] No answer/No voice mail  [] VM Full/unable to leave message  [] Phone not in service  Patient reminded to bring all medications and/or complete list.  Confirmed patient has transportation. Gave directions, instructed to utilize valet parking.

## 2024-03-06 LAB — CMP14+EGFR
ALT: 15 IU/L (ref 0–44)
AST: 17 IU/L (ref 0–40)
Albumin: 4.3 g/dL (ref 3.8–4.8)
Alkaline Phosphatase: 105 IU/L (ref 47–123)
BUN/Creatinine Ratio: 20 (ref 10–24)
BUN: 20 mg/dL (ref 8–27)
Bilirubin Total: 0.4 mg/dL (ref 0.0–1.2)
CO2: 23 mmol/L (ref 20–29)
Calcium: 9.5 mg/dL (ref 8.6–10.2)
Chloride: 97 mmol/L (ref 96–106)
Creatinine, Ser: 0.98 mg/dL (ref 0.76–1.27)
Globulin, Total: 2.7 g/dL (ref 1.5–4.5)
Glucose: 197 mg/dL — ABNORMAL HIGH (ref 70–99)
Potassium: 4.5 mmol/L (ref 3.5–5.2)
Sodium: 137 mmol/L (ref 134–144)
Total Protein: 7 g/dL (ref 6.0–8.5)
eGFR: 78 mL/min/1.73 (ref >59)

## 2024-03-07 ENCOUNTER — Ambulatory Visit: Payer: Self-pay | Admitting: Family Medicine

## 2024-03-07 DIAGNOSIS — R0601 Orthopnea: Secondary | ICD-10-CM

## 2024-03-07 DIAGNOSIS — J3489 Other specified disorders of nose and nasal sinuses: Secondary | ICD-10-CM | POA: Insufficient documentation

## 2024-03-07 NOTE — Assessment & Plan Note (Signed)
 Has PT, OT and nursing coming out to th he is gaining strength.  e house.

## 2024-03-07 NOTE — Assessment & Plan Note (Signed)
 He is back on Entresto  24/26 and now takes Lasix  20 mg daily and potassium chloride .  Checking his renal function and potassium levels today.  He is feeling better and is sleeping under oxygen.  Does not really feel he needs the oxygen but it is comforting.  Weight today is 170.  Ask him to weigh daily if he gains 3 pounds we need to increase his Lasix .

## 2024-03-07 NOTE — Progress Notes (Unsigned)
 Advanced Heart Failure Clinic Note   Referring Physician: recent admission PCP: [name], Devere POUR, MD (last seen 03/25) Cardiologist: Hilarie Rocher, MD (to be seen 04/25)  Chief Complaint: shortness of breath  HPI:  Manuel [name] is a 78 y/o male with a history of CAD, PAD s/p right BKA, gout, DM, hyperlipidemia, HTN, depression, anxiety, insomnia and chronic heart failure. CAD s/p PCI to [facility] with overlapping stents in 2011. extensive PAD s/p PTA L common Iliac, R peroneal, R distal SFA/prox popliteal, and stent to R distal SFA/prox popliteal arteries (05/14/21). Echo in 10/2022 with LVEF of 25-30%.   Admitted 07/09/23 due to shortness of breath, decreased urine output, and inability to sleep for the last year. Found to have NSTEMI with peak troponin of 1045. Echo 06/2023 with LVEF 25-30%, moderately dilated LV, and mild valvular disease. LHC 06/2023 where 3 DES were placed to the LAD. Chest x-ray noted mild diffuse vascular congestion, bilateral trace pleural effusions, concerning for CHF.   He presents today, with his girlfriend, for his initial HF visit with a chief complaint of minimal shortness of breath with exertion. Has associated fatigue, chronic difficulty sleeping and slight swelling in left lower leg along with this. Has been in a wheelchair for the last 3 months due to right BKA. Denies chest pain, palpitations, abdominal distention or dizziness. He says that he's taking trazodone  for sleep but says that it doesn't help. Tried melatonin in the past which also didn't work.   Drinks a 12 ounce can of beer daily. No liquor in the last 4-5 months, denies tobacco/ drug use.   Would like a referral to [facility] cardiology as that is who did his cath during the hospital and they plan to continue with [facility]. To have upcoming sleep study done.   Review of Systems: [y] = yes, [ ]  = no   General: Weight gain [ ] ; Weight loss [ ] ; Anorexia [ ] ; Fatigue davis.dad ]; Fever [ ] ; Chills [ ] ; Weakness [ ]    Cardiac: Chest pain/pressure [ ] ; Resting SOB [ ] ; Exertional SOB davis.dad ]; Orthopnea [ ] ; Pedal Edema davis.dad ]; Palpitations [ ] ; Syncope [ ] ; Presyncope [ ] ; Paroxysmal nocturnal dyspnea[ ]   Pulmonary: Cough [ ] ; Wheezing[ ] ; Hemoptysis[ ] ; Sputum [ ] ; Snoring [ ]   GI: Vomiting[ ] ; Dysphagia[ ] ; Melena[ ] ; Hematochezia [ ] ; Heartburn[ ] ; Abdominal pain [ ] ; Constipation [ ] ; Diarrhea [ ] ; BRBPR [ ]   GU: Hematuria[ ] ; Dysuria [ ] ; Nocturia[ ]   Vascular: Pain in legs with walking [ ] ; Pain in feet with lying flat [ ] ; Non-healing sores [ ] ; Stroke [ ] ; TIA [ ] ; Slurred speech [ ] ;  Neuro: Headaches[ ] ; Vertigo[ ] ; Seizures[ ] ; Paresthesias[ ] ;Blurred vision [ ] ; Diplopia [ ] ; Vision changes [ ]   Ortho/Skin: Arthritis [ ] ; Joint pain [ ] ; Muscle pain [ ] ; Joint swelling [ ] ; Back Pain [ ] ; Rash [ ]   Psych: Depression[ ] ; Anxiety[ ]   Heme: Bleeding problems [ ] ; Clotting disorders [ ] ; Anemia [ ]   Endocrine: Diabetes davis.dad ]; Thyroid dysfunction[ ]    Past Medical History:  Diagnosis Date   Acute ST elevation myocardial infarction (STEMI) of inferior wall (HCC) 04/19/2010   a.) transfered from [facility] to [facility] --> LHC/PCI (very difficult procedure) --> 3.0 x 23 mm and 3.0 x 12 mm Xience stents to RCA   Allergies    Arthritis    Benign essential hypertension    Bilateral carotid artery disease 05/08/2021  a.) carotid doppler 05/08/2021: 1-39% BICA   CAD (coronary artery disease) 04/19/2010   a.) inferior STEMI 04/19/2010 --> LHC/PCI: 50-70% pD1, 80% pRI, 90/90/90% RCA (overlapping 3.0 x 23 and 3.0 x 12 mm Xience DES); b.) MV 11/10/2018: fixed minimally reversible inferior perfusion defect   Cellulitis of foot    Chronic HFrEF (heart failure with reduced ejection fraction) (HCC)    DDD (degenerative disc disease), cervical    Diabetes mellitus type 2, insulin  dependent (HCC)    Diverticulosis    Full dentures    Gout    Hard of hearing    History of bilateral cataract extraction 2022   History of ETOH  abuse    Hyperlipidemia    Ischemic cardiomyopathy 04/19/2010   a.) TTE 04/19/2010: 40%; b.) TTE 04/20/2014: EF >55%; c.) TTE 11/10/2018: EF 45%; d.) TTE 11/14/2022: EF 25-30%; e. 06/2023 Echo: EF 25-30%, nl RV size/fxn. Mild Manuel. Mild-mod TR. Ao sclerosis w/o stenosis.   Long term current use of aspirin     Long term current use of clopidogrel     Lumbar degenerative disc disease    Lumbar radiculopathy    Lumbar vertebral fracture (chronic superior endplate of L1)    NSTEMI (non-ST elevated myocardial infarction) (HCC) 01/15/2023   OSA (obstructive sleep apnea)    a.) unable to tolerate nocturnal PAP therapy   Peripheral artery disease    a.) stenting 05/14/21: 12 mm x 12 cm LifeStent RIGHT dis SFA/prox pop; b.) s/p cath directed thrombolysis RIGHT SFA/pop 06/14/21; c.) s/p mech thrombectomy + stenting 06/15/21: 8 mm x 25cm & 8 mm x 7.5cm Viabahn; d.) s/p BILAT CFA, profunda femoris, SFA endarterectomies + fogarty embolectomy + stenting 11/15/22: 12mm x 58mm Lifestream BILAT CIAs, 14 mm x 6 cm Lifestream & 13 mm x 5 cm Viabahn LEFT EIA   Peripheral neuropathy    Umbilical hernia     Current Outpatient Medications  Medication Sig Dispense Refill   artificial tears ophthalmic solution Place 1 drop into both eyes as needed for dry eyes. 15 mL 0   aspirin  EC 81 MG tablet Take 1 tablet (81 mg total) by mouth daily. 150 tablet 2   atorvastatin  (LIPITOR ) 80 MG tablet Take 1 tablet (80 mg total) by mouth at bedtime. 90 tablet 3   carvedilol  (COREG ) 3.125 MG tablet Take 1 tablet (3.125 mg total) by mouth 2 (two) times daily with a meal. 180 tablet 3   clopidogrel  (PLAVIX ) 75 MG tablet Take 1 tablet (75 mg total) by mouth daily. 90 tablet 3   dapagliflozin  propanediol (FARXIGA ) 10 MG TABS tablet Take 1 tablet (10 mg total) by mouth daily. 90 tablet 3   ferrous sulfate  325 (65 FE) MG EC tablet Take 325 mg by mouth daily with breakfast.     FLUoxetine (PROZAC) 20 MG capsule Take 1 capsule (20 mg total) by  mouth daily. 90 capsule 3   fluticasone  (FLONASE ) 50 MCG/ACT nasal spray Place 1 spray into both nostrils 2 (two) times daily as needed for allergies or rhinitis.     folic acid  (FOLVITE ) 1 MG tablet Take 1 tablet (1 mg total) by mouth daily. 90 tablet 1   furosemide  (LASIX ) 40 MG tablet Take 1 tablet (40 mg total) by mouth daily. 30 tablet 2   gabapentin  (NEURONTIN ) 100 MG capsule Take 1 capsule (100 mg total) by mouth at bedtime. 90 capsule 3   insulin  aspart protamine  - aspart (NOVOLOG  70/30 MIX) (70-30) 100 UNIT/ML FlexPen Inject 3 Units into the  skin 2 (two) times daily with a meal.     loratadine  (CLARITIN ) 10 MG tablet Take 1 tablet (10 mg total) by mouth daily as needed for allergies.     nitroGLYCERIN  (NITROSTAT ) 0.4 MG SL tablet Place 1 tablet (0.4 mg total) under the tongue every 5 (five) minutes as needed for chest pain. Max: 3 tablets per day     nystatin  (MYCOSTATIN /NYSTOP ) powder Apply topically 2 (two) times daily. 15 g 0   pantoprazole  (PROTONIX ) 40 MG tablet Take 1 tablet (40 mg total) by mouth daily. 90 tablet 2   polyethylene glycol (MIRALAX  / GLYCOLAX ) 17 g packet Take 17 g by mouth daily.     potassium chloride  (KLOR-CON  M) 10 MEQ tablet Take 1 tablet (10 mEq total) by mouth 2 (two) times daily. 30 tablet 0   sacubitril -valsartan  (ENTRESTO ) 24-26 MG Take 1 tablet by mouth every 12 (twelve) hours. 180 tablet 3   spironolactone  (ALDACTONE ) 25 MG tablet Take 1/2 tablet (12.5 mg total) by mouth daily. 30 tablet 2   thiamine  (VITAMIN B-1) 100 MG tablet Take 1 tablet (100 mg total) by mouth daily. 90 tablet 0   traZODone  (DESYREL ) 100 MG tablet Take 1 tablet (100 mg total) by mouth at bedtime. 30 tablet 5   No current facility-administered medications for this visit.    No Known Allergies    Social History   Socioeconomic History   Marital status: Married    Spouse name: [name]   Number of children: 3   Years of education: Not on file   Highest education level: Not on  file  Occupational History   Not on file  Tobacco Use   Smoking status: Never    Passive exposure: Never   Smokeless tobacco: Never  Vaping Use   Vaping status: Never Used  Substance and Sexual Activity   Alcohol  use: Yes    Alcohol /week: 21.0 standard drinks of alcohol     Types: 7 Glasses of wine, 14 Cans of beer per week    Comment: occassional   Drug use: No   Sexual activity: Not on file  Other Topics Concern   Not on file  Social History Narrative   Not on file   Social Drivers of Health   Financial Resource Strain: Low Risk  (07/11/2023)   Overall Financial Resource Strain (CARDIA)    Difficulty of Paying Living Expenses: Not hard at all  Food Insecurity: No Food Insecurity (02/26/2024)   Hunger Vital Sign    Worried About Running Out of Food in the Last Year: Never true    Ran Out of Food in the Last Year: Never true  Transportation Needs: No Transportation Needs (02/26/2024)   PRAPARE - Administrator, Civil Service (Medical): No    Lack of Transportation (Non-Medical): No  Physical Activity: Not on file  Stress: Not on file  Social Connections: Unknown (02/26/2024)   Social Connection and Isolation Panel    Frequency of Communication with Friends and Family: More than three times a week    Frequency of Social Gatherings with Friends and Family: More than three times a week    Attends Religious Services: 1 to 4 times per year    Active Member of [employer] or Organizations: Yes    Attends Banker Meetings: 1 to 4 times per year    Marital Status: Patient declined  Recent Concern: Social Connections - Moderately Isolated (01/17/2024)   Social Connection and Isolation Panel    Frequency  of Communication with Friends and Family: More than three times a week    Frequency of Social Gatherings with Friends and Family: More than three times a week    Attends Religious Services: Patient declined    Database Administrator or Organizations: No    Attends  Banker Meetings: Patient declined    Marital Status: Married  Catering Manager Violence: Not At Risk (02/26/2024)   Humiliation, Afraid, Rape, and Kick questionnaire    Fear of Current or Ex-Partner: No    Emotionally Abused: No    Physically Abused: No    Sexually Abused: No      Family History  Problem Relation Age of Onset   Scoliosis Mother    Heart disease Father     There were no vitals filed for this visit.  Wt Readings from Last 3 Encounters:  02/28/24 176 lb 5.9 oz (80 kg)  02/19/24 195 lb (88.5 kg)  02/05/24 180 lb (81.6 kg)   Lab Results  Component Value Date   CREATININE 0.98 03/05/2024   CREATININE 0.86 02/28/2024   CREATININE 0.66 02/27/2024    PHYSICAL EXAM:  General: Well appearing. No resp difficulty HEENT: normal Neck: supple, no JVD Cor: Regular rhythm, rate. No rubs, gallops or murmurs Lungs: clear Abdomen: soft, nontender, nondistended. Extremities: no cyanosis, clubbing, rash, 2+ pitting edema left lower leg up to knee; right BKA Neuro: alert & oriented X 3. Moves all 4 extremities w/o difficulty. Affect pleasant   ECG: not done   ASSESSMENT & PLAN:  1: ICM with reduced ejection fraction- - NYHA class II - fluid up with worsening symptoms and pedal edema - Echo 07/10/23 with LVEF 25-30%, moderately dilated LV, and mild valvular disease.  - continue carvedilol  25mg  BID - continue farxiga  10mg  daily - continue furosemide  40mg  daily - continue entresto  24/26mg  BID - continue spironolactone  25mg  daily - will add metolazone  2.5mg  daily X 3 doses with 40meq potassium daily X 3 doses - get compression sock and put on left leg - doesn't add salt to his food but does eat out 3/4 of the time; reviewed how to make low sodium choices - BMET, pro-BNP today & will message PCP to recheck labs next week - BNP 07/09/23 was 2395.3  2: HTN- - BP 119/71 - saw PCP Marlee) 03/25; has upcoming PCP appt @ [facility] 04/25 - BMET 07/16/23  reviewed and showed sodium 133, potassium 3.9, creatinine 1.22 & GFR >60  3: CAD- - was previously followed at [facility] but is now being followed by [facility] (per his request) - does not have post-hospital f/u so referral placed today - [facility] 07/14/23: Severe three-vessel coronary artery disease. Right heart catheterization showed moderately elevated right and left-sided filling pressures, moderate to severe pulmonary hypertension and moderately reduced cardiac output. Successful angioplasty and 3 overlapped drug-eluting stent placement to the LAD. - continue atorvastatin  80mg  daily  4: DM- - A1c 07/10/23 was 8.0% - continue insulin   5: PAD- - saw vascular ([facility]) 02/25 - s/p PTA L common Iliac, R peroneal, R distal SFA/prox popliteal, and stent to R distal SFA/prox popliteal arteries (05/14/21) - continue plavix  75mg  daiy   Since he has PCP appt next week, will have him f/u here in a few weeks. Sooner if needed.   Ellouise DELENA Class, FNP 03/07/24

## 2024-03-07 NOTE — Assessment & Plan Note (Signed)
 Doing much better with his depression.  No talk about dying today.  Mood does fluctuate and he does have some good days and bad but overall he is having more good days than bad.  Please continue Prozac 20 mg daily

## 2024-03-07 NOTE — Assessment & Plan Note (Signed)
 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  Can try Flonase  or Astelin and see if either of these is effective please avoid decongestants especially Sudafed.

## 2024-03-07 NOTE — Assessment & Plan Note (Signed)
 Has had his flu vaccine.  Encouraging him to get COVID and RSV from the pharmacy.

## 2024-03-07 NOTE — Progress Notes (Addendum)
 Established Patient Office Visit  Subjective   Patient ID: Manuel [name], male    DOB: 1946-03-12  Age: 78 y.o. MRN: [id number]  Chief Complaint  Patient presents with   Medical Management of Chronic Issues    HPI Discussed the use of AI scribe software for clinical note transcription with the patient, who gave verbal consent to proceed.  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  He was experiencing PND and orthopnea after stopping his Entresto  due to low blood pressure.  He is back on Entresto  and has oxygen in the house now.  He says he sleeps with oxygen even though he may not need it because it is so comforting.  He was hospitalized last week for four nights, he had congestive heart failure with fluid overload.  He received IV Lasix , and was discharged with oral Lasix  20 mg daily and potassium 10 mEq daily. He is compliant with his medication regimen, taking Lasix  daily, and reports a significant diuretic effect. He struggles with fluid intake but is making an effort to increase water  equivalents consuming various juices and limiting himself to just two beers a day. He reports that he does not notice swelling in his legs, but does experience fluid accumulation in his chest, which he attributes to his heart condition. He uses oxygen at home, particularly at night, with stable  oxygen levels during the day without supplemental oxygen.  He reports he slept 3 hours last night and was up for a few minutes and was able to go right back to sleep and he slept another 3 or 4 hours so he feels much better because he has been able to rest.  He has not been checking his blood sugar or his blood pressures.  Ask him to weigh every morning today he weighed 270.  Very important that he keeps up his weight so we know we need to increase his Lasix .  Ordering OLE therapy. 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 Despite trials of pep/ acapella, his ability to mobilize secretions remains severely compromised. These methods have proven insufficient for his condition. Ordering High Frequency Chest Wall Oscillation (HFCWO) vest therapy to reduce his risk of mucus plugging, recurrent respiratory infections, and hospital readmissions.   He has a persistent need to blow his nose, ongoing for the past year, partially attributed to having seven cats at home. He recently had a sinus infection treated with doxycycline , but was unable to continue the medication during a recent hospital stay. Some improvement in sinus symptoms is noted, though he continues to experience nasal drainage and occasional epistaxis. His nose was previously inflamed and broke out externally, but this has since resolved.  His sleep has improved significantly, now achieving approximately seven hours per night. He has recently switched from Lexapro  to Prozac , which he feels has positively impacted his mood.  He describes having good and bad days, with fatigue being a prominent issue on bad days. Since his hospital discharge, he has had more good days than bad.  He has a history of a traumatic ruptured femoral artery, which required endovascular repair and then had wound dehiscence. He is no longer using a  wound vac, and reports that his wound has significantly reduced in size and has a bright red tissue.  He has experienced issues with his fingernails and toenails, including disintegration and loss, but they are regrowing. He has been seeing a podiatrist for these issues.  He has received his flu shot but has not yet received the RSV or COVID vaccines. He has some difficulty accessing these vaccines due to mobility issues, but he has a wheelchair and a supportive community that has helped with accessibility, such as building a ramp to his home.       Objective:     BP 120/66 (BP Location: Right Arm, Patient Position: Sitting, Cuff Size: Normal)   Pulse 75   Temp 98.4 F (36.9 C) (Oral)   Resp 18   SpO2 92%    Physical Exam Vitals and nursing note reviewed.  Constitutional:      Appearance: Normal appearance.  HENT:     Head: Normocephalic and atraumatic.  Eyes:     Conjunctiva/sclera: Conjunctivae normal.  Cardiovascular:     Rate and Rhythm: Normal rate and regular rhythm.  Pulmonary:     Effort: Pulmonary effort is normal.     Breath sounds: Rales (Inferior aspect of both lungs has inspiratory rales.) present.  Musculoskeletal:     Right lower leg: No edema.     Left lower leg: No edema.  Skin:    General: Skin is warm and dry.  Neurological:     Mental Status: He is alert and oriented to person, place, and time.  Psychiatric:        Mood and Affect: Mood normal.        Behavior: Behavior normal.        Thought Content: Thought content normal.        Judgment: Judgment normal.          Results for orders placed or performed in visit on 03/05/24  CMP14+EGFR  Result Value Ref Range   Glucose 197 (H) 70 - 99 mg/dL   BUN 20 8 - 27 mg/dL   Creatinine, Ser 9.01 0.76 - 1.27 mg/dL   eGFR 78 >40 fO/fpw/8.26   BUN/Creatinine Ratio 20 10 - 24   Sodium 137 134 - 144 mmol/L   Potassium 4.5 3.5 - 5.2 mmol/L   Chloride 97 96 - 106 mmol/L   CO2 23 20 - 29 mmol/L    Calcium  9.5 8.6 - 10.2 mg/dL   Total Protein 7.0 6.0 - 8.5 g/dL   Albumin 4.3 3.8 - 4.8 g/dL   Globulin, Total 2.7 1.5 - 4.5 g/dL   Bilirubin Total 0.4 0.0 - 1.2 mg/dL   Alkaline Phosphatase 105 47 - 123 IU/L   AST 17 0 - 40 IU/L   ALT 15 0 - 44 IU/L      The ASCVD Risk score (Arnett DK, et al., 2019) failed to calculate for the following reasons:   The 2019 ASCVD risk score is only valid for ages 88 to 24   Risk score cannot be calculated because patient has a medical history suggesting prior/existing ASCVD    Assessment & Plan:  Primary hypertension -  CMP14+EGFR  Acute on chronic combined systolic and diastolic congestive heart failure (HCC) Assessment & Plan: He is back on Entresto  24/26 and now takes Lasix  20 mg daily and potassium chloride .  Checking his renal function and potassium levels today.  He is feeling better and is sleeping under oxygen.  Does not really feel he needs the oxygen but it is comforting.  Weight today is 170.  Ask him to weigh daily if he gains 3 pounds we need to increase his Lasix .   Depression, recurrent Assessment & Plan: Doing much better with his depression.  No talk about dying today.  Mood does fluctuate and he does have some good days and bad but overall he is having more good days than bad.  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  Can try Flonase  or Astelin and see if either of these is effective please avoid decongestants especially Sudafed.   Immunization due Assessment & Plan: Has had his flu vaccine.  Encouraging him to get COVID and RSV from the pharmacy.             Manuel Coutant K Trelon Plush, MD

## 2024-03-08 ENCOUNTER — Observation Stay (HOSPITAL_BASED_OUTPATIENT_CLINIC_OR_DEPARTMENT_OTHER): Admitting: Family

## 2024-03-08 ENCOUNTER — Encounter: Payer: Self-pay | Admitting: Family

## 2024-03-08 VITALS — BP 133/76 | HR 71

## 2024-03-08 DIAGNOSIS — I252 Old myocardial infarction: Secondary | ICD-10-CM

## 2024-03-08 DIAGNOSIS — I2510 Atherosclerotic heart disease of native coronary artery without angina pectoris: Secondary | ICD-10-CM | POA: Diagnosis not present

## 2024-03-08 DIAGNOSIS — Z794 Long term (current) use of insulin: Secondary | ICD-10-CM

## 2024-03-08 DIAGNOSIS — E1151 Type 2 diabetes mellitus with diabetic peripheral angiopathy without gangrene: Secondary | ICD-10-CM | POA: Diagnosis not present

## 2024-03-08 DIAGNOSIS — I10 Essential (primary) hypertension: Secondary | ICD-10-CM

## 2024-03-08 DIAGNOSIS — I739 Peripheral vascular disease, unspecified: Secondary | ICD-10-CM

## 2024-03-08 DIAGNOSIS — I5022 Chronic systolic (congestive) heart failure: Secondary | ICD-10-CM

## 2024-03-08 NOTE — Patient Instructions (Signed)
 It was good to see you today!

## 2024-03-11 ENCOUNTER — Ambulatory Visit: Admitting: Infectious Diseases

## 2024-03-15 MED ORDER — POTASSIUM CHLORIDE CRYS ER 10 MEQ PO TBCR: 10.0000 meq | EXTENDED_RELEASE_TABLET | Freq: Two times a day (BID) | ORAL | 0 refills | Status: AC

## 2024-03-16 ENCOUNTER — Ambulatory Visit: Attending: Infectious Diseases | Admitting: Infectious Diseases

## 2024-03-16 ENCOUNTER — Encounter: Payer: Self-pay | Admitting: Infectious Diseases

## 2024-03-16 VITALS — BP 142/78 | HR 74 | Temp 97.2°F

## 2024-03-16 DIAGNOSIS — R7881 Bacteremia: Secondary | ICD-10-CM | POA: Diagnosis not present

## 2024-03-16 DIAGNOSIS — B9689 Other specified bacterial agents as the cause of diseases classified elsewhere: Secondary | ICD-10-CM

## 2024-03-16 DIAGNOSIS — L929 Granulomatous disorder of the skin and subcutaneous tissue, unspecified: Secondary | ICD-10-CM

## 2024-03-16 DIAGNOSIS — T827XXD Infection and inflammatory reaction due to other cardiac and vascular devices, implants and grafts, subsequent encounter: Secondary | ICD-10-CM | POA: Insufficient documentation

## 2024-03-16 DIAGNOSIS — Z89511 Acquired absence of right leg below knee: Secondary | ICD-10-CM | POA: Diagnosis not present

## 2024-03-16 NOTE — Patient Instructions (Signed)
 You are here for follow up of rt groin wound- there is no infection and the wound has exuberant granulation tissue- will get the vascular team to take care of it. You dont need a follow up with me

## 2024-03-16 NOTE — Progress Notes (Signed)
 NAME: Manuel [name]  DOB: [birth year]  MRN: [id number]  Date/Time: 03/16/2024 2:41 PM  REQUESTING PROVIDER Subjective:  Follow up visit for rt groin wound infection Here with his wife Doing much better Off antibiotics  ? 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 For ruptured right femoral artery leaking into the subcutaneous tissue forming a hematoma.  He was taken for surgery on 12/11/2023 by vascular surgeon and underwent excision of the old graft and portion of the femoral artery and had bypass graft with autoLog us  external femoral artery.  Multiple cultures were taken and they all grew Serratia.  Also he had Serratia bacteremia.  He received 6 weeks of IV ertapenem  until 01/22/2024 and then he got a week of p.o. Bactrim . He also had Candida infection around the area and received fluconazole  for 7 days.   Wound vac off Was in the hosptial 11/5-/11/8 for CHF That time he was sent home on 1 week of linezolid - GBS in culture He has completed antibiotics nearly 10 days ago Doing fine Past Medical History:  Diagnosis Date   Acute ST elevation myocardial infarction (STEMI) of inferior wall (HCC) 04/19/2010   a.) transfered from [facility] to [facility] --> LHC/PCI (very difficult procedure) --> 3.0 x 23 mm and 3.0 x 12 mm Xience stents to RCA   Allergies    Arthritis    Benign essential hypertension    Bilateral carotid artery disease 05/08/2021   a.) carotid doppler 05/08/2021: 1-39% BICA   CAD (coronary artery disease) 04/19/2010   a.) inferior STEMI 04/19/2010 --> LHC/PCI: 50-70% pD1, 80% pRI, 90/90/90% RCA (overlapping 3.0 x 23 and 3.0 x 12 mm Xience DES); b.) MV 11/10/2018: fixed minimally reversible inferior perfusion  defect   Cellulitis of foot    Chronic HFrEF (heart failure with reduced ejection fraction) (HCC)    DDD (degenerative disc disease), cervical    Diabetes mellitus type 2, insulin  dependent (HCC)    Diverticulosis    Full dentures    Gout    Hard of hearing    History of bilateral cataract extraction 2022   History of ETOH abuse    Hyperlipidemia    Ischemic cardiomyopathy 04/19/2010   a.) TTE 04/19/2010: 40%; b.) TTE 04/20/2014: EF >55%; c.) TTE 11/10/2018: EF 45%; d.) TTE 11/14/2022: EF 25-30%; e. 06/2023 Echo: EF 25-30%, nl RV size/fxn. Mild MR. Mild-mod TR. Ao sclerosis w/o stenosis.   Long term current use of aspirin     Long term current use of clopidogrel     Lumbar degenerative disc disease    Lumbar radiculopathy    Lumbar vertebral fracture (chronic superior endplate of L1)    NSTEMI (non-ST elevated myocardial infarction) (HCC) 01/15/2023   OSA (obstructive sleep apnea)    a.) unable to tolerate nocturnal PAP therapy   Peripheral artery disease    a.) stenting 05/14/21: 12 mm x 12 cm LifeStent RIGHT dis SFA/prox pop; b.) s/p cath directed thrombolysis RIGHT SFA/pop 06/14/21; c.) s/p mech thrombectomy + stenting 06/15/21: 8 mm x 25cm & 8 mm x 7.5cm Viabahn; d.) s/p BILAT CFA, profunda femoris, SFA endarterectomies + fogarty embolectomy + stenting 11/15/22: 12mm x 58mm Lifestream BILAT CIAs, 14 mm x 6 cm Lifestream & 13 mm x 5 cm Viabahn LEFT EIA   Peripheral neuropathy  Umbilical hernia     Past Surgical History:  Procedure Laterality Date   AMPUTATION Right 11/18/2022   Procedure: AMPUTATION 4TH AND 5TH RAY;  Surgeon: [name], [name];  Location: [facility];  Service: Orthopedics/Podiatry;  Laterality: Right;  4th and 5th toe   AMPUTATION Right 04/20/2023   Procedure: AMPUTATION BELOW KNEE;  Surgeon: [name], MD;  Location: [facility];  Service: Vascular;  Laterality: Right;  block then general   APPLICATION OF WOUND VAC Right 01/09/2023   Procedure: APPLICATION OF WOUND  VAC;  Surgeon: [name], MD;  Location: [facility];  Service: Vascular;  Laterality: Right;   APPLICATION OF WOUND VAC Right 12/12/2023   Procedure: APPLICATION, WOUND VAC;  Surgeon: [name], MD;  Location: [facility];  Service: Vascular;  Laterality: Right;   APPLICATION OF WOUND VAC Right 12/17/2023   Procedure: APPLICATION, WOUND VAC;  Surgeon: [name], MD;  Location: [facility];  Service: Vascular;  Laterality: Right;  EXCHANGE   APPLICATION OF WOUND VAC Right 12/23/2023   Procedure: APPLICATION, WOUND VAC;  Surgeon: [name], MD;  Location: [facility];  Service: General;  Laterality: Right;   APPLICATION OF WOUND VAC Right 12/11/2023   Procedure: APPLICATION, WOUND VAC;  Surgeon: [name], MD;  Location: [facility];  Service: Vascular;  Laterality: Right;   CATARACT EXTRACTION W/PHACO Right 03/14/2021   Procedure: CATARACT EXTRACTION PHACO AND INTRAOCULAR LENS PLACEMENT (IOC) RIGHT DIABETIC;  Surgeon: [name], MD;  Location: [facility];  Service: Ophthalmology;  Laterality: Right;  Diabetic 16.78 01:39.9   CATARACT EXTRACTION W/PHACO Left 03/28/2021   Procedure: CATARACT EXTRACTION PHACO AND INTRAOCULAR LENS PLACEMENT (IOC) LEFT DIABETIC 6.93 01:22.0;  Surgeon: [name], MD;  Location: [facility];  Service: Ophthalmology;  Laterality: Left;  Diabetic   COLONOSCOPY     CORONARY ANGIOPLASTY WITH STENT PLACEMENT  03/2010   Procedure: CORONARY ANGIOPLASTY WITH STENT PLACEMENT; Location: [name]   CORONARY STENT INTERVENTION N/A 07/14/2023   Procedure: CORONARY STENT INTERVENTION;  Surgeon: [name], MD;  Location: [facility];  Service: Cardiovascular;  Laterality: N/A;   ENDARTERECTOMY FEMORAL Bilateral 11/15/2022   Procedure: BILATERAL COMMON FEMORAL PROFUNDA FEMORIS AND SUPERFICIAL FEMORAL ARTERY ENDARTECTOMIES, RIGHT FOGARTY EMBOLECTOMY OF THE RIGHT SFA  AND  POPLITEAL ARTERIES. EZELLA AND RIGHT LOWER EXTREMITY  ANGIOGRAM.;  Surgeon: [name], MD;  Location: [facility];  Service: Vascular;  Laterality: Bilateral;   ENDARTERECTOMY FEMORAL Right 12/11/2023   Procedure: ENDARTERECTOMY, FEMORAL;  Surgeon: [name], MD;  Location: [facility];  Service: Vascular;  Laterality: Right;  Re-do of femoral endarterectomy with sartorius flap   FEMORAL ARTERY EXPLORATION Right 12/12/2023   Procedure: EXPLORATION, ARTERY, FEMORAL- stop bleeding with suture repair anastomaosis , abx beads;  Surgeon: [name], MD;  Location: [facility];  Service: Vascular;  Laterality: Right;   GROIN DEBRIDEMENT Right 12/28/2023   Procedure: DEBRIDEMENT, INGUINAL REGION;  Surgeon: Elnor Norleen CROME, MD;  Location: [facility];  Service: Vascular;  Laterality: Right;   HEMATOMA EVACUATION N/A 12/12/2023   Procedure: EVACUATION HEMATOMA;  Surgeon: [name], MD;  Location: [facility];  Service: Vascular;  Laterality: N/A;   INSERTION OF ILIAC STENT Bilateral 11/15/2022   Procedure: BILATERAL STENT INSERTION IN BILATERAL  COMMON ILIAC ARTERY, STENT INSERTION OF LEFT EXTERNAL ILIAC ARTERY. ANGIOPLASTY RIGHT TIBIAL  AND POPLITEAL ARTERY.;  Surgeon: [name], MD;  Location: [facility];  Service: Vascular;  Laterality: Bilateral;  IRRIGATION AND DEBRIDEMENT KNEE Right 04/21/2023   Procedure: IRRIGATION AND DEBRIDEMENT KNEE;  Surgeon: Zafonte, Brian Hershall, MD;  Location: [facility];  Service: Orthopedics;  Laterality: Right;   LOWER EXTREMITY ANGIOGRAPHY Right 05/14/2021   Procedure: LOWER EXTREMITY ANGIOGRAPHY;  Surgeon: [name], MD;  Location: [facility];  Service: Cardiovascular;  Laterality: Right;   LOWER EXTREMITY ANGIOGRAPHY Right 06/14/2021   Procedure: Lower Extremity Angiography;  Surgeon: [name], MD;  Location: [facility];  Service: Cardiovascular;  Laterality: Right;   LOWER EXTREMITY ANGIOGRAPHY Right 11/11/2022   Procedure: Lower Extremity Angiography;  Surgeon: [name], MD;  Location: [facility];  Service: Cardiovascular;  Laterality: Right;   LOWER EXTREMITY ANGIOGRAPHY Right 12/04/2023   Procedure: Lower Extremity Angiography;  Surgeon: [name], MD;  Location: [facility];  Service: Cardiovascular;  Laterality: Right;   LOWER EXTREMITY INTERVENTION Right 06/15/2021   Procedure: LOWER EXTREMITY INTERVENTION;  Surgeon: [name], MD;  Location: [facility];  Service: Cardiovascular;  Laterality: Right;   RIGHT/LEFT HEART CATH AND CORONARY ANGIOGRAPHY N/A 07/14/2023   Procedure: RIGHT/LEFT HEART CATH AND CORONARY ANGIOGRAPHY;  Surgeon: [name], MD;  Location: [facility];  Service: Cardiovascular;  Laterality: N/A;   TONSILLECTOMY     TRANSMETATARSAL AMPUTATION Right 02/17/2023   Procedure: TRANSMETATARSAL AMPUTATION;  Surgeon: Lennie Barter, [name];  Location: [facility];  Service: Orthopedics/Podiatry;  Laterality: Right;   WOUND DEBRIDEMENT Right 01/09/2023   Procedure: DEBRIDEMENT WOUND;  Surgeon: [name], MD;  Location: [facility];  Service: Vascular;  Laterality: Right;    Social History   Socioeconomic History   Marital status: Married    Spouse name: [name]   Number of children: 3   Years of education: Not on file   Highest education level: Not on file  Occupational History   Not on file  Tobacco Use   Smoking status: Never    Passive exposure: Never   Smokeless tobacco: Never  Vaping Use   Vaping status: Never Used  Substance and Sexual Activity   Alcohol  use: Yes    Alcohol /week: 21.0 standard drinks of alcohol     Types: 7 Glasses of wine, 14 Cans of beer per week    Comment: occassional   Drug use: No   Sexual activity: Not on file  Other Topics Concern   Not on file  Social History Narrative   Not on file   Social Drivers of Health   Financial Resource Strain: Low Risk  (07/11/2023)   Overall Financial Resource Strain (CARDIA)    Difficulty of Paying Living Expenses: Not hard at all  Food  Insecurity: No Food Insecurity (02/26/2024)   Hunger Vital Sign    Worried About Running Out of Food in the Last Year: Never true    Ran Out of Food in the Last Year: Never true  Transportation Needs: No Transportation Needs (02/26/2024)   PRAPARE - Administrator, Civil Service (Medical): No    Lack of Transportation (Non-Medical): No  Physical Activity: Not on file  Stress: Not on file  Social Connections: Unknown (02/26/2024)   Social Connection and Isolation Panel    Frequency of Communication with Friends and Family: More than three times a week    Frequency of Social Gatherings with Friends and Family: More than three times a week    Attends Religious Services: 1 to 4 times per year  Active Member of Clubs or Organizations: Yes    Attends Banker Meetings: 1 to 4 times per year    Marital Status: Patient declined  Recent Concern: Social Connections - Moderately Isolated (01/17/2024)   Social Connection and Isolation Panel    Frequency of Communication with Friends and Family: More than three times a week    Frequency of Social Gatherings with Friends and Family: More than three times a week    Attends Religious Services: Patient declined    Database Administrator or Organizations: No    Attends Banker Meetings: Patient declined    Marital Status: Married  Catering Manager Violence: Not At Risk (02/26/2024)   Humiliation, Afraid, Rape, and Kick questionnaire    Fear of Current or Ex-Partner: No    Emotionally Abused: No    Physically Abused: No    Sexually Abused: No    Family History  Problem Relation Age of Onset   Scoliosis Mother    Heart disease Father    No Known Allergies I? Current Outpatient Medications  Medication Sig Dispense Refill   artificial tears ophthalmic solution Place 1 drop into both eyes as needed for dry eyes. 15 mL 0   aspirin  EC 81 MG tablet Take 1 tablet (81 mg total) by mouth daily. 150 tablet 2    atorvastatin  (LIPITOR ) 80 MG tablet Take 1 tablet (80 mg total) by mouth at bedtime. 90 tablet 3   carvedilol  (COREG ) 3.125 MG tablet Take 1 tablet (3.125 mg total) by mouth 2 (two) times daily with a meal. 180 tablet 3   clopidogrel  (PLAVIX ) 75 MG tablet Take 1 tablet (75 mg total) by mouth daily. 90 tablet 3   dapagliflozin  propanediol (FARXIGA ) 10 MG TABS tablet Take 1 tablet (10 mg total) by mouth daily. 90 tablet 3   ferrous sulfate  325 (65 FE) MG EC tablet Take 325 mg by mouth daily with breakfast.     FLUoxetine  (PROZAC ) 20 MG capsule Take 1 capsule (20 mg total) by mouth daily. 90 capsule 3   fluticasone  (FLONASE ) 50 MCG/ACT nasal spray Place 1 spray into both nostrils 2 (two) times daily as needed for allergies or rhinitis.     folic acid  (FOLVITE ) 1 MG tablet Take 1 tablet (1 mg total) by mouth daily. 90 tablet 1   furosemide  (LASIX ) 40 MG tablet Take 1 tablet (40 mg total) by mouth daily. 30 tablet 2   gabapentin  (NEURONTIN ) 100 MG capsule Take 1 capsule (100 mg total) by mouth at bedtime. 90 capsule 3   insulin  aspart protamine  - aspart (NOVOLOG  70/30 MIX) (70-30) 100 UNIT/ML FlexPen Inject 3 Units into the skin 2 (two) times daily with a meal.     loratadine  (CLARITIN ) 10 MG tablet Take 1 tablet (10 mg total) by mouth daily as needed for allergies.     nitroGLYCERIN  (NITROSTAT ) 0.4 MG SL tablet Place 1 tablet (0.4 mg total) under the tongue every 5 (five) minutes as needed for chest pain. Max: 3 tablets per day     nystatin  (MYCOSTATIN /NYSTOP ) powder Apply topically 2 (two) times daily. 15 g 0   pantoprazole  (PROTONIX ) 40 MG tablet Take 1 tablet (40 mg total) by mouth daily. 90 tablet 2   polyethylene glycol (MIRALAX  / GLYCOLAX ) 17 g packet Take 17 g by mouth daily.     potassium chloride  (KLOR-CON  M) 10 MEQ tablet Take 1 tablet (10 mEq total) by mouth 2 (two) times daily. 180 tablet 0  sacubitril -valsartan  (ENTRESTO ) 24-26 MG Take 1 tablet by mouth every 12 (twelve) hours. 180 tablet  3   spironolactone  (ALDACTONE ) 25 MG tablet Take 1/2 tablet (12.5 mg total) by mouth daily. 30 tablet 2   thiamine  (VITAMIN B-1) 100 MG tablet Take 1 tablet (100 mg total) by mouth daily. 90 tablet 0   traZODone  (DESYREL ) 100 MG tablet Take 1 tablet (100 mg total) by mouth at bedtime. 30 tablet 5   No current facility-administered medications for this visit.     Abtx:  Anti-infectives (From admission, onward)    None       REVIEW OF SYSTEMS:  Const: negative fever, negative chills, negative weight loss Eyes: negative diplopia or visual changes, negative eye pain ENT: negative coryza, negative sore throat Resp: negative cough, hemoptysis, dyspnea Cards: negative for chest pain, palpitations, lower extremity edema GU: negative for frequency, dysuria and hematuria GI: Negative for abdominal pain, diarrhea, bleeding, constipation Skin: negative for rash and pruritus Heme: negative for easy bruising and gum/nose bleeding MS: negative for myalgias, arthralgias, back pain and muscle weakness Neurolo:negative for headaches, dizziness, vertigo, memory problems  Psych:  anxiety, depression  Endocrine: , diabetes Allergy/Immunology- negative for any medication or food allergies  Objective:  VITALS:  BP (!) 142/78   Pulse 74   Temp (!) 97.2 F (36.2 C) (Temporal)   SpO2 97%    PHYSICAL EXAM:  General: Alert, cooperative, no distress, appears stated age.  Lungs: Clear to auscultation bilaterally. No Wheezing or Rhonchi. No rales. Heart: Regular rate and rhythm, no murmur, rub or gallop. Abdomen: Soft, non-tender,not distended. Bowel sounds normal. No masses Extremities: Right femoral area groin wound is covered with granulation tissue- smaller than beofre- skin tethered      02/10/2024  02/05/2024  01/26/2024  Right BKA Skin: No rashes or lesions. Or bruising Lymph: Cervical, supraclavicular normal. Neurologic: Grossly non-focal Pertinent Labs Lab Results CBC     Component Value Date/Time   WBC 7.6 02/28/2024 0610   RBC 4.08 (L) 02/28/2024 0610   HGB 11.3 (L) 02/28/2024 0610   HGB 11.5 (L) 02/19/2024 1522   HCT 35.1 (L) 02/28/2024 0610   HCT 35.7 (L) 02/19/2024 1522   PLT 264 02/28/2024 0610   PLT 241 02/19/2024 1522   MCV 86.0 02/28/2024 0610   MCV 90 02/19/2024 1522   MCV 91 02/16/2012 2013   MCH 27.7 02/28/2024 0610   MCHC 32.2 02/28/2024 0610   RDW 14.6 02/28/2024 0610   RDW 13.9 02/19/2024 1522   RDW 13.0 02/16/2012 2013   LYMPHSABS 1.9 02/19/2024 1522   MONOABS 0.5 12/04/2023 1141   EOSABS 0.3 02/19/2024 1522   BASOSABS 0.0 02/19/2024 1522       Latest Ref Rng & Units 03/05/2024    3:31 PM 02/28/2024    6:10 AM 02/27/2024    5:20 AM  CMP  Glucose 70 - 99 mg/dL 802  843  864   BUN 8 - 27 mg/dL 20  18  21    Creatinine 0.76 - 1.27 mg/dL 9.01  9.13  9.33   Sodium 134 - 144 mmol/L 137  137  139   Potassium 3.5 - 5.2 mmol/L 4.5  3.8  3.8   Chloride 96 - 106 mmol/L 97  98  99   CO2 20 - 29 mmol/L 23  27  28    Calcium  8.6 - 10.2 mg/dL 9.5  8.9  9.3   Total Protein 6.0 - 8.5 g/dL 7.0     Total Bilirubin  0.0 - 1.2 mg/dL 0.4     Alkaline Phos 47 - 123 IU/L 105     AST 0 - 40 IU/L 17     ALT 0 - 44 IU/L 15       ? 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  With rupture and formation of hematoma in August 2025 On 12/11/2023 he underwent resection of the infected common femoral artery and removal of the femoral stents and removal of the infected profunda femoris artery stents and the graft.  He then had a right external iliac to profunda femoris artery bypass with with autologous superficial femoral artery conduit The residual surgical wound is closing slowly.  There is some tethering of the skin and soft tissue underneath That wound looks healthy He had completed 6 weeks of IV or ertapenem  followed by 1 week of p.o. Bactrim . A superficial culture that was done while he  was in the hospital on 10/2 had Staph epidermidis rare Enterococcus faecium and rare Pseudomonas aeruginosa which were not treated as they were thought to be contaminants. He took 1 week of linezolid  11/8 to 11/14  Exuberant granulation tissue- may benefit from silver nitrate Discussed with Dr.Dew and Gwendlyn Shank - they will see him on 12/2 and manage  Serratia bacteremia treated with 6 weeks of antibiotic. No need for a follow up appt with ID.  f________________________________  discussed with patient and his wife in detail

## 2024-03-17 ENCOUNTER — Ambulatory Visit: Admitting: Cardiology

## 2024-03-23 ENCOUNTER — Encounter (INDEPENDENT_AMBULATORY_CARE_PROVIDER_SITE_OTHER): Payer: Self-pay | Admitting: Vascular Surgery

## 2024-03-23 ENCOUNTER — Ambulatory Visit (INDEPENDENT_AMBULATORY_CARE_PROVIDER_SITE_OTHER): Admitting: Vascular Surgery

## 2024-03-23 VITALS — BP 146/65 | HR 88 | Resp 17 | Ht 68.0 in | Wt 180.6 lb

## 2024-03-23 DIAGNOSIS — I739 Peripheral vascular disease, unspecified: Secondary | ICD-10-CM | POA: Diagnosis not present

## 2024-03-23 DIAGNOSIS — E1142 Type 2 diabetes mellitus with diabetic polyneuropathy: Secondary | ICD-10-CM

## 2024-03-23 DIAGNOSIS — I10 Essential (primary) hypertension: Secondary | ICD-10-CM

## 2024-03-23 DIAGNOSIS — T8130XA Disruption of wound, unspecified, initial encounter: Secondary | ICD-10-CM

## 2024-03-23 DIAGNOSIS — Z9889 Other specified postprocedural states: Secondary | ICD-10-CM

## 2024-03-23 NOTE — Progress Notes (Signed)
 Subjective:    Patient ID: Manuel [name], male    DOB: 03-Jul-1945, 78 y.o.   MRN: [id number] Chief Complaint  Patient presents with   Follow-up    4 weeks-wound ck    Manuel [name] returns to clinic  today in follow up for evaluation of right groin wound. Patient's wife is at the visit today. He endorses he has not had any problems with the wound since leaving the hospital. He endorses home health nursing is coming to his home multiple times a week to do wound checks. He denies any pain to his groin. Denies any erythema or swelling to the area. No signs or symptoms of infection to note. Patient endorses he was seen by Infectious Disease who gave him silver nitrate to place in the wound bed for healing. Wound is healing well.     Discussed the use of AI scribe software for clinical note transcription with the patient, who gave verbal consent to proceed.  History of Present Illness            Results           Review of Systems  Constitutional: Negative.   Musculoskeletal:        Hx of right BKA  Skin:  Positive for color change and wound.       Color change to his left foot and calf. Purple  Wound to right groin post endarterectomy   All other systems reviewed and are negative.      Objective:   Physical Exam Vitals reviewed.  Constitutional:      Appearance: Normal appearance. He is normal weight.  HENT:     Head: Normocephalic and atraumatic.  Eyes:     Pupils: Pupils are equal, round, and reactive to light.  Cardiovascular:     Rate and Rhythm: Normal rate and regular rhythm.     Pulses: Normal pulses.     Heart sounds: Normal heart sounds.  Pulmonary:     Effort: Pulmonary effort is normal.     Breath sounds: Normal breath sounds.  Abdominal:     General: Abdomen is flat. Bowel sounds are normal.     Palpations: Abdomen is soft.  Musculoskeletal:        General: Normal range of motion.     Comments: Hx of Right BKA  Skin:    General: Skin is warm  and dry.     Capillary Refill: Capillary refill takes 2 to 3 seconds.  Neurological:     General: No focal deficit present.     Mental Status: He is alert and oriented to person, place, and time. Mental status is at baseline.  Psychiatric:        Mood and Affect: Mood normal.        Behavior: Behavior normal.        Thought Content: Thought content normal.        Judgment: Judgment normal.     Physical Exam          BP (!) 146/65   Pulse 88   Resp 17   Ht 5' 8 (1.727 m)   Wt 180 lb 9.6 oz (81.9 kg)   BMI 27.46 kg/m   Past Medical History:  Diagnosis Date   Acute ST elevation myocardial infarction (STEMI) of inferior wall (HCC) 04/19/2010   a.) transfered from [facility] to [facility] --> LHC/PCI (very difficult procedure) --> 3.0 x 23 mm and 3.0 x 12 mm Xience stents to RCA  Allergies    Arthritis    Benign essential hypertension    Bilateral carotid artery disease 05/08/2021   a.) carotid doppler 05/08/2021: 1-39% BICA   CAD (coronary artery disease) 04/19/2010   a.) inferior STEMI 04/19/2010 --> LHC/PCI: 50-70% pD1, 80% pRI, 90/90/90% RCA (overlapping 3.0 x 23 and 3.0 x 12 mm Xience DES); b.) MV 11/10/2018: fixed minimally reversible inferior perfusion defect   Cellulitis of foot    Chronic HFrEF (heart failure with reduced ejection fraction) (HCC)    DDD (degenerative disc disease), cervical    Diabetes mellitus type 2, insulin  dependent (HCC)    Diverticulosis    Full dentures    Gout    Hard of hearing    History of bilateral cataract extraction 2022   History of ETOH abuse    Hyperlipidemia    Ischemic cardiomyopathy 04/19/2010   a.) TTE 04/19/2010: 40%; b.) TTE 04/20/2014: EF >55%; c.) TTE 11/10/2018: EF 45%; d.) TTE 11/14/2022: EF 25-30%; e. 06/2023 Echo: EF 25-30%, nl RV size/fxn. Mild MR. Mild-mod TR. Ao sclerosis w/o stenosis.   Long term current use of aspirin     Long term current use of clopidogrel     Lumbar degenerative disc disease    Lumbar radiculopathy     Lumbar vertebral fracture (chronic superior endplate of L1)    NSTEMI (non-ST elevated myocardial infarction) (HCC) 01/15/2023   OSA (obstructive sleep apnea)    a.) unable to tolerate nocturnal PAP therapy   Peripheral artery disease    a.) stenting 05/14/21: 12 mm x 12 cm LifeStent RIGHT dis SFA/prox pop; b.) s/p cath directed thrombolysis RIGHT SFA/pop 06/14/21; c.) s/p mech thrombectomy + stenting 06/15/21: 8 mm x 25cm & 8 mm x 7.5cm Viabahn; d.) s/p BILAT CFA, profunda femoris, SFA endarterectomies + fogarty embolectomy + stenting 11/15/22: 12mm x 58mm Lifestream BILAT CIAs, 14 mm x 6 cm Lifestream & 13 mm x 5 cm Viabahn LEFT EIA   Peripheral neuropathy    Umbilical hernia     Social History   Socioeconomic History   Marital status: Married    Spouse name: [name]   Number of children: 3   Years of education: Not on file   Highest education level: Not on file  Occupational History   Not on file  Tobacco Use   Smoking status: Never    Passive exposure: Never   Smokeless tobacco: Never  Vaping Use   Vaping status: Never Used  Substance and Sexual Activity   Alcohol  use: Yes    Alcohol /week: 21.0 standard drinks of alcohol     Types: 7 Glasses of wine, 14 Cans of beer per week    Comment: occassional   Drug use: No   Sexual activity: Not on file  Other Topics Concern   Not on file  Social History Narrative   Not on file   Social Drivers of Health   Financial Resource Strain: Low Risk  (07/11/2023)   Overall Financial Resource Strain (CARDIA)    Difficulty of Paying Living Expenses: Not hard at all  Food Insecurity: No Food Insecurity (02/26/2024)   Hunger Vital Sign    Worried About Radiation Protection Practitioner of Food in the Last Year: Never true    Ran Out of Food in the Last Year: Never true  Transportation Needs: No Transportation Needs (02/26/2024)   PRAPARE - Administrator, Civil Service (Medical): No    Lack of Transportation (Non-Medical): No  Physical Activity: Not on  file  Stress: Not  on file  Social Connections: Unknown (02/26/2024)   Social Connection and Isolation Panel    Frequency of Communication with Friends and Family: More than three times a week    Frequency of Social Gatherings with Friends and Family: More than three times a week    Attends Religious Services: 1 to 4 times per year    Active Member of [employer] or Organizations: Yes    Attends Banker Meetings: 1 to 4 times per year    Marital Status: Patient declined  Recent Concern: Social Connections - Moderately Isolated (01/17/2024)   Social Connection and Isolation Panel    Frequency of Communication with Friends and Family: More than three times a week    Frequency of Social Gatherings with Friends and Family: More than three times a week    Attends Religious Services: Patient declined    Active Member of Clubs or Organizations: No    Attends Banker Meetings: Patient declined    Marital Status: Married  Catering Manager Violence: Not At Risk (02/26/2024)   Humiliation, Afraid, Rape, and Kick questionnaire    Fear of Current or Ex-Partner: No    Emotionally Abused: No    Physically Abused: No    Sexually Abused: No    Past Surgical History:  Procedure Laterality Date   AMPUTATION Right 11/18/2022   Procedure: AMPUTATION 4TH AND 5TH RAY;  Surgeon: [name], [name];  Location: [facility];  Service: Orthopedics/Podiatry;  Laterality: Right;  4th and 5th toe   AMPUTATION Right 04/20/2023   Procedure: AMPUTATION BELOW KNEE;  Surgeon: [name], MD;  Location: [facility];  Service: Vascular;  Laterality: Right;  block then general   APPLICATION OF WOUND VAC Right 01/09/2023   Procedure: APPLICATION OF WOUND VAC;  Surgeon: [name], MD;  Location: [facility];  Service: Vascular;  Laterality: Right;   APPLICATION OF WOUND VAC Right 12/12/2023   Procedure: APPLICATION, WOUND VAC;  Surgeon: [name], MD;  Location: [facility];  Service: Vascular;  Laterality:  Right;   APPLICATION OF WOUND VAC Right 12/17/2023   Procedure: APPLICATION, WOUND VAC;  Surgeon: [name], MD;  Location: [facility];  Service: Vascular;  Laterality: Right;  EXCHANGE   APPLICATION OF WOUND VAC Right 12/23/2023   Procedure: APPLICATION, WOUND VAC;  Surgeon: [name], MD;  Location: [facility];  Service: General;  Laterality: Right;   APPLICATION OF WOUND VAC Right 12/11/2023   Procedure: APPLICATION, WOUND VAC;  Surgeon: [name], MD;  Location: [facility];  Service: Vascular;  Laterality: Right;   CATARACT EXTRACTION W/PHACO Right 03/14/2021   Procedure: CATARACT EXTRACTION PHACO AND INTRAOCULAR LENS PLACEMENT (IOC) RIGHT DIABETIC;  Surgeon: [name], MD;  Location: [facility];  Service: Ophthalmology;  Laterality: Right;  Diabetic 16.78 01:39.9   CATARACT EXTRACTION W/PHACO Left 03/28/2021   Procedure: CATARACT EXTRACTION PHACO AND INTRAOCULAR LENS PLACEMENT (IOC) LEFT DIABETIC 6.93 01:22.0;  Surgeon: [name], MD;  Location: [facility];  Service: Ophthalmology;  Laterality: Left;  Diabetic   COLONOSCOPY     CORONARY ANGIOPLASTY WITH STENT PLACEMENT  03/2010   Procedure: CORONARY ANGIOPLASTY WITH STENT PLACEMENT; Location: [name]   CORONARY STENT INTERVENTION N/A 07/14/2023   Procedure: CORONARY STENT INTERVENTION;  Surgeon: [name], MD;  Location: [facility];  Service: Cardiovascular;  Laterality: N/A;   ENDARTERECTOMY FEMORAL Bilateral 11/15/2022   Procedure: BILATERAL COMMON FEMORAL PROFUNDA FEMORIS AND SUPERFICIAL FEMORAL ARTERY ENDARTECTOMIES, RIGHT FOGARTY  EMBOLECTOMY OF THE RIGHT SFA  AND  POPLITEAL ARTERIES. EZELLA AND RIGHT LOWER EXTREMITY ANGIOGRAM.;  Surgeon: [name], MD;  Location: [facility];  Service: Vascular;  Laterality: Bilateral;   ENDARTERECTOMY FEMORAL Right 12/11/2023   Procedure: ENDARTERECTOMY, FEMORAL;  Surgeon: [name], MD;  Location: [facility];  Service:  Vascular;  Laterality: Right;  Re-do of femoral endarterectomy with sartorius flap   FEMORAL ARTERY EXPLORATION Right 12/12/2023   Procedure: EXPLORATION, ARTERY, FEMORAL- stop bleeding with suture repair anastomaosis , abx beads;  Surgeon: [name], MD;  Location: [facility];  Service: Vascular;  Laterality: Right;   GROIN DEBRIDEMENT Right 12/28/2023   Procedure: DEBRIDEMENT, INGUINAL REGION;  Surgeon: Elnor Norleen CROME, MD;  Location: [facility];  Service: Vascular;  Laterality: Right;   HEMATOMA EVACUATION N/A 12/12/2023   Procedure: EVACUATION HEMATOMA;  Surgeon: [name], MD;  Location: [facility];  Service: Vascular;  Laterality: N/A;   INSERTION OF ILIAC STENT Bilateral 11/15/2022   Procedure: BILATERAL STENT INSERTION IN BILATERAL  COMMON ILIAC ARTERY, STENT INSERTION OF LEFT EXTERNAL ILIAC ARTERY. ANGIOPLASTY RIGHT TIBIAL  AND POPLITEAL ARTERY.;  Surgeon: [name], MD;  Location: [facility];  Service: Vascular;  Laterality: Bilateral;   IRRIGATION AND DEBRIDEMENT KNEE Right 04/21/2023   Procedure: IRRIGATION AND DEBRIDEMENT KNEE;  Surgeon: Zafonte, Brian Glenwood, MD;  Location: [facility];  Service: Orthopedics;  Laterality: Right;   LOWER EXTREMITY ANGIOGRAPHY Right 05/14/2021   Procedure: LOWER EXTREMITY ANGIOGRAPHY;  Surgeon: [name], MD;  Location: [facility];  Service: Cardiovascular;  Laterality: Right;   LOWER EXTREMITY ANGIOGRAPHY Right 06/14/2021   Procedure: Lower Extremity Angiography;  Surgeon: [name], MD;  Location: [facility];  Service: Cardiovascular;  Laterality: Right;   LOWER EXTREMITY ANGIOGRAPHY Right 11/11/2022   Procedure: Lower Extremity Angiography;  Surgeon: [name], MD;  Location: [facility];  Service: Cardiovascular;  Laterality: Right;   LOWER EXTREMITY ANGIOGRAPHY Right 12/04/2023   Procedure: Lower Extremity Angiography;  Surgeon: [name], MD;  Location: [facility];  Service: Cardiovascular;   Laterality: Right;   LOWER EXTREMITY INTERVENTION Right 06/15/2021   Procedure: LOWER EXTREMITY INTERVENTION;  Surgeon: [name], MD;  Location: [facility];  Service: Cardiovascular;  Laterality: Right;   RIGHT/LEFT HEART CATH AND CORONARY ANGIOGRAPHY N/A 07/14/2023   Procedure: RIGHT/LEFT HEART CATH AND CORONARY ANGIOGRAPHY;  Surgeon: [name], MD;  Location: [facility];  Service: Cardiovascular;  Laterality: N/A;   TONSILLECTOMY     TRANSMETATARSAL AMPUTATION Right 02/17/2023   Procedure: TRANSMETATARSAL AMPUTATION;  Surgeon: Lennie Barter, [name];  Location: [facility];  Service: Orthopedics/Podiatry;  Laterality: Right;   WOUND DEBRIDEMENT Right 01/09/2023   Procedure: DEBRIDEMENT WOUND;  Surgeon: [name], MD;  Location: [facility];  Service: Vascular;  Laterality: Right;    Family History  Problem Relation Age of Onset   Scoliosis Mother    Heart disease Father     No Known Allergies     Latest Ref Rng & Units 02/28/2024    6:10 AM 02/27/2024    5:20 AM 02/26/2024    4:37 AM  CBC  WBC 4.0 - 10.5 K/uL 7.6  9.2  5.9   Hemoglobin 13.0 - 17.0 g/dL 88.6  87.3  89.8   Hematocrit 39.0 - 52.0 % 35.1  39.6  31.8   Platelets 150 - 400 K/uL 264  275  251  CMP     Component Value Date/Time   NA 137 03/05/2024 1531   NA 137 02/16/2012 2013   K 4.5 03/05/2024 1531   K 3.8 02/16/2012 2013   CL 97 03/05/2024 1531   CL 102 02/16/2012 2013   CO2 23 03/05/2024 1531   CO2 25 02/16/2012 2013   GLUCOSE 197 (H) 03/05/2024 1531   GLUCOSE 156 (H) 02/28/2024 0610   GLUCOSE 154 (H) 02/16/2012 2013   BUN 20 03/05/2024 1531   BUN 10 02/16/2012 2013   CREATININE 0.98 03/05/2024 1531   CREATININE 0.72 02/16/2012 2013   CALCIUM  9.5 03/05/2024 1531   CALCIUM  9.6 02/16/2012 2013   PROT 7.0 03/05/2024 1531   PROT 7.5 02/16/2012 2013   ALBUMIN 4.3 03/05/2024 1531   ALBUMIN 4.4 02/16/2012 2013   AST 17 03/05/2024 1531   AST 52 (H) 02/16/2012 2013   ALT 15  03/05/2024 1531   ALT 101 (H) 02/16/2012 2013   ALKPHOS 105 03/05/2024 1531   ALKPHOS 77 02/16/2012 2013   BILITOT 0.4 03/05/2024 1531   BILITOT 0.6 02/16/2012 2013   EGFR 78 03/05/2024 1531   GFRNONAA >60 02/28/2024 0610   GFRNONAA >60 02/16/2012 2013     No results found.     Assessment & Plan:   1. Peripheral arterial disease with history of revascularization (Primary) Patients left lower extremity is doing well. He has no swelling but purple discoloration from mid calf to his toes. He is not wearing compression. He last had ultrasounds with ABI's back in may which were all normal with ABI greater than 1.   Plan is to repeat these studies in 3 months to evaluate blood flow to his left leg.   Follow up in 3 months with arterial duplex U/S of left leg with ABI's and wound check.   2. Wound dehiscence Patient with right groin post operative wound dehiscence. Wound Bed is beefy red. Wound measures 4 cm long X 0.5 cm wide and no depth and no tunneling in the upper proximal portion of the groin. Wound continues to heal slowly. Patient has been applying silver nitrate to the open area. This is healing very well. He no longer needs any antibiotics. Will see patient back in clinic for another wound check in 3 month.   Wife states she needs a reorder on the silver nitrate. It was given to her by ID so I don't know what she actually has been using. I asked she take a picture of it and place it into his chart or send it to us  so I can reorder what she currently has been using.   Patient was instructed to use A&D Ointment to his left lower extremity for his dry skin.   3. Essential hypertension Continue antihypertensive medications as already ordered, these medications have been reviewed and there are no changes at this time.  4. Diabetic polyneuropathy associated with type 2 diabetes mellitus (HCC) Continue hypoglycemic medications as already ordered, these medications have been reviewed  and there are no changes at this time.    Assessment and Plan              Current Outpatient Medications on File Prior to Visit  Medication Sig Dispense Refill   artificial tears ophthalmic solution Place 1 drop into both eyes as needed for dry eyes. 15 mL 0   aspirin  EC 81 MG tablet Take 1 tablet (81 mg total) by mouth daily. 150 tablet 2   atorvastatin  (LIPITOR ) 80 MG  tablet Take 1 tablet (80 mg total) by mouth at bedtime. 90 tablet 3   carvedilol  (COREG ) 3.125 MG tablet Take 1 tablet (3.125 mg total) by mouth 2 (two) times daily with a meal. 180 tablet 3   clopidogrel  (PLAVIX ) 75 MG tablet Take 1 tablet (75 mg total) by mouth daily. 90 tablet 3   dapagliflozin  propanediol (FARXIGA ) 10 MG TABS tablet Take 1 tablet (10 mg total) by mouth daily. 90 tablet 3   ferrous sulfate  325 (65 FE) MG EC tablet Take 325 mg by mouth daily with breakfast.     FLUoxetine  (PROZAC ) 20 MG capsule Take 1 capsule (20 mg total) by mouth daily. 90 capsule 3   fluticasone  (FLONASE ) 50 MCG/ACT nasal spray Place 1 spray into both nostrils 2 (two) times daily as needed for allergies or rhinitis.     folic acid  (FOLVITE ) 1 MG tablet Take 1 tablet (1 mg total) by mouth daily. 90 tablet 1   furosemide  (LASIX ) 40 MG tablet Take 1 tablet (40 mg total) by mouth daily. 30 tablet 2   gabapentin  (NEURONTIN ) 100 MG capsule Take 1 capsule (100 mg total) by mouth at bedtime. 90 capsule 3   insulin  aspart protamine  - aspart (NOVOLOG  70/30 MIX) (70-30) 100 UNIT/ML FlexPen Inject 3 Units into the skin 2 (two) times daily with a meal.     loratadine  (CLARITIN ) 10 MG tablet Take 1 tablet (10 mg total) by mouth daily as needed for allergies.     nitroGLYCERIN  (NITROSTAT ) 0.4 MG SL tablet Place 1 tablet (0.4 mg total) under the tongue every 5 (five) minutes as needed for chest pain. Max: 3 tablets per day     nystatin  (MYCOSTATIN /NYSTOP ) powder Apply topically 2 (two) times daily. 15 g 0   pantoprazole  (PROTONIX ) 40 MG tablet Take  1 tablet (40 mg total) by mouth daily. 90 tablet 2   polyethylene glycol (MIRALAX  / GLYCOLAX ) 17 g packet Take 17 g by mouth daily.     potassium chloride  (KLOR-CON  M) 10 MEQ tablet Take 1 tablet (10 mEq total) by mouth 2 (two) times daily. 180 tablet 0   sacubitril -valsartan  (ENTRESTO ) 24-26 MG Take 1 tablet by mouth every 12 (twelve) hours. 180 tablet 3   spironolactone  (ALDACTONE ) 25 MG tablet Take 1/2 tablet (12.5 mg total) by mouth daily. 30 tablet 2   thiamine  (VITAMIN B-1) 100 MG tablet Take 1 tablet (100 mg total) by mouth daily. 90 tablet 0   traZODone  (DESYREL ) 100 MG tablet Take 1 tablet (100 mg total) by mouth at bedtime. 30 tablet 5   No current facility-administered medications on file prior to visit.    There are no Patient Instructions on file for this visit. No follow-ups on file.   [name], NP

## 2024-03-26 ENCOUNTER — Telehealth: Admitting: Family Medicine

## 2024-03-26 ENCOUNTER — Ambulatory Visit: Admitting: Family Medicine

## 2024-03-26 DIAGNOSIS — I5042 Chronic combined systolic (congestive) and diastolic (congestive) heart failure: Secondary | ICD-10-CM

## 2024-03-26 DIAGNOSIS — F339 Major depressive disorder, recurrent, unspecified: Secondary | ICD-10-CM | POA: Diagnosis not present

## 2024-03-26 DIAGNOSIS — E1142 Type 2 diabetes mellitus with diabetic polyneuropathy: Secondary | ICD-10-CM | POA: Diagnosis not present

## 2024-03-26 DIAGNOSIS — E876 Hypokalemia: Secondary | ICD-10-CM | POA: Diagnosis not present

## 2024-03-26 DIAGNOSIS — Z7984 Long term (current) use of oral hypoglycemic drugs: Secondary | ICD-10-CM

## 2024-03-26 NOTE — Progress Notes (Deleted)
   Established Patient Office Visit  Subjective   Patient ID: CHRISTION LEONHARD, male    DOB: 30-Sep-1945  Age: 78 y.o. MRN: [id number]  Chief Complaint  Patient presents with   Follow-up    HPI      Objective:     There were no vitals taken for this visit. {Vitals History (Optional):23777}  Physical Exam   {Perform Simple Foot Exam  Perform Detailed exam:1} {Insert foot Exam (Optional):30965}   No results found for any visits on 03/26/24.  {Labs (Optional):23779}  The ASCVD Risk score (Arnett DK, et al., 2019) failed to calculate for the following reasons:   The 2019 ASCVD risk score is only valid for ages 61 to 48   Risk score cannot be calculated because patient has a medical history suggesting prior/existing ASCVD    Assessment & Plan:  There are no diagnoses linked to this encounter.   No follow-ups on file.    Arsenio Schnorr K Chiyeko Ferre, MD

## 2024-03-28 DIAGNOSIS — I5042 Chronic combined systolic (congestive) and diastolic (congestive) heart failure: Secondary | ICD-10-CM | POA: Insufficient documentation

## 2024-03-28 NOTE — Assessment & Plan Note (Signed)
 Neuropathic pain at night is better and he is able to sleep.  He is on trazodone  100 mg nightly.

## 2024-03-28 NOTE — Assessment & Plan Note (Signed)
 He is on Entresto  24/26, Lasix  40 mg daily, spironolactone  25 mg one half daily and potassium chloride  10 mEq 2 a day.  Denies PND and orthopnea but does sleep under oxygen because it is reassuring.  Has not been checking daily weights.  Has some swelling of his foot but it does not extend up his leg.  Denies any signs or symptoms of hypotension and is trying to drink water  better.  Needs renal function and potassium checked.  Will come in 1 day next week for this.

## 2024-03-28 NOTE — Progress Notes (Signed)
 Virtual Visit via Video Note  I connected with TILLMAN KAZMIERSKI on 03/28/24 at  1:30 PM EST by a video enabled telemedicine application and verified that I am speaking with the correct person using two identifiers.  Location: Patient: At home Provider: In office at [facility]   I discussed the limitations of evaluation and management by telemedicine and the availability of in person appointments. The patient expressed understanding and agreed to proceed.  History of Present Illness: Mataio Mele is a delightful 78 year old male with CAD (s/p PCI to RCA with overlapping stents, NSTEMI 3/25, peak troponin 1045, 3 DES to LAD), ischemic cardiomyopathy (7/24 LVEF 25 to 35%, 325 echo LVEF unchanged and moderate dilatation LV), PAD (2011 s/p PTA L common iliac, right peritoneal, left distal SFA, proximal popliteal, stent to the right distal SFA), s/p right BKA, gout, DMT2, mixed hyperlipidemia, HTN, depression/anxiety/insomnia, lumbar DDD and uncontrolled painful peripheral diabetic neuropathy, s/p traumatic rupture right femoral artery and s/p endovascular repair with wound dehiscence, episodic hypotension due to dehydration. History was taken principally by Bruna but wife, Evlyn also contributed.  He reports that he feels good. He does not have a lot of energy but despite being tired he feels good he slept well last night.  He is beginning to have more good nights resting then otherwise.  When he gets a good night sleep that he feels better the next day.  He denies any shortness of breath with lying down.  He does not require his oxygen but he does wear it at night because it is comforting, reassuring,. He reports he is not having pain anywhere he does have edema in his leg his foot is swollen but the swelling does not get any higher than his foot.  He is not weighing himself daily.  He has not changed his dosage of Lasix  or potassium.  He is taking Lasix  40 mg 1 a day, spironolactone  25 mg one half  daily and potassium chloride  10 mEq twice a day.  Advised we need a blood pressure check of his renal function and potassium.  He feels like he can do that 1 day next week. Saw the vascular surgeon and report on his femoral wound is that it is healing and will not need a skin graft. We ordered him a chest wall oscillation vest but he did not opt for this because his partner was $600 a month.  Discussed using an inspiratory spirometer instead. He has not been checking his blood sugar very often but the last time he checked it was running in the 130s or 140s.  He does admit that his blood sugar dropped one time this week.  He did not check to see how low it was but he felt terrible.  He was sweating, his vision was wacky and he was having shakiness and dizziness.  He did not have chest pain or shortness of breath.  Ate something with sugar and then the few minutes began to feel better.  Assessment and Plan:  He is on Entresto  24/26, Lasix  40 mg daily, spironolactone  25 mg one half daily and potassium chloride  10 mEq 2 a day. Denies PND and orthopnea but does sleep under oxygen because it is reassuring. Has not been checking daily weights. Has some swelling of his foot but it does not extend up his leg. Denies any signs or symptoms of hypotension and is trying to drink water  better. Needs renal function and potassium checked. Will come in 1 day next week  for this.   Neuropathic pain at night is better and he is able to sleep. He is on trazodone  100 mg nightly.   Depression seems to be much better.  He is on Prozac  20 mg daily.  Follow Up Instructions:    I discussed the assessment and treatment plan with the patient. The patient was provided an opportunity to ask questions and all were answered. The patient agreed with the plan and demonstrated an understanding of the instructions.   The patient was advised to call back or seek an in-person evaluation if the symptoms worsen or if the condition fails  to improve as anticipated.  I provided 10 minutes of non-face-to-face time during this encounter.   Elpidia Karn K Coy Vandoren, MD

## 2024-03-28 NOTE — Assessment & Plan Note (Signed)
 Depression seems to be much better.  He is on Prozac  20 mg daily.

## 2024-04-12 ENCOUNTER — Telehealth: Payer: Self-pay | Admitting: Family Medicine

## 2024-04-12 NOTE — Telephone Encounter (Signed)
 Copied from CRM [id number]. Topic: Clinical - Medical Advice >> Apr 12, 2024  2:03 PM Anairis L wrote: Reason for CRM: Evlyn is calling in because Home nurse, suggested a medicine holiday especially with traZODone  (DESYREL ) 100 MG tablet. He is also having issue with depression.    Patient had been very tired. App set up for 04/19/2024  Wife is requesting a call back.   [phone]

## 2024-04-12 NOTE — Telephone Encounter (Signed)
 He is very tired.  Wants a drug holiday from Trazodone  because he is so tired.  He is also so depressed.  He thinks about dying.  He feels like shit.  He takes 11 drugs in the morning and another 6 at night.  He has been feeling bad for the last 6 days.  Has not checked his BP recently.  Will check his BP and we will talk again tomorrow.

## 2024-04-13 ENCOUNTER — Telehealth: Payer: Self-pay | Admitting: Family Medicine

## 2024-04-13 NOTE — Telephone Encounter (Signed)
 Called and LM that I called.  Advised that I will be back at work on December the 26th.  I will call him again on December 26th.

## 2024-04-13 NOTE — Telephone Encounter (Signed)
 Copied from CRM [id number]. Topic: Clinical - Medical Advice >> Apr 13, 2024 12:49 PM Manuel [name] wrote: Blood pressure for today was 170/87. No symptoms

## 2024-04-19 ENCOUNTER — Telehealth (INDEPENDENT_AMBULATORY_CARE_PROVIDER_SITE_OTHER)

## 2024-04-19 DIAGNOSIS — I10 Essential (primary) hypertension: Secondary | ICD-10-CM

## 2024-04-19 DIAGNOSIS — I739 Peripheral vascular disease, unspecified: Secondary | ICD-10-CM | POA: Diagnosis not present

## 2024-04-19 DIAGNOSIS — E118 Type 2 diabetes mellitus with unspecified complications: Secondary | ICD-10-CM | POA: Diagnosis not present

## 2024-04-19 DIAGNOSIS — I252 Old myocardial infarction: Secondary | ICD-10-CM

## 2024-04-19 DIAGNOSIS — E1165 Type 2 diabetes mellitus with hyperglycemia: Secondary | ICD-10-CM | POA: Diagnosis not present

## 2024-04-19 DIAGNOSIS — E119 Type 2 diabetes mellitus without complications: Secondary | ICD-10-CM | POA: Diagnosis not present

## 2024-04-19 DIAGNOSIS — N179 Acute kidney failure, unspecified: Secondary | ICD-10-CM | POA: Diagnosis not present

## 2024-04-19 DIAGNOSIS — Z794 Long term (current) use of insulin: Secondary | ICD-10-CM | POA: Diagnosis not present

## 2024-04-19 DIAGNOSIS — I2510 Atherosclerotic heart disease of native coronary artery without angina pectoris: Secondary | ICD-10-CM | POA: Diagnosis not present

## 2024-04-19 NOTE — Progress Notes (Signed)
 "    Acute Patient Visit  Physician: Anali Cabanilla A Shakara Tweedy, MD  Patient: Manuel [name] MRN: [id number] DOB: 1945-12-13 PCP: [name], [name], MD     Subjective:  No chief complaint on file.    HPI: The patient is a 78 y.o. male who presents today for:   Discussed the use of AI scribe software for clinical note transcription with the patient, who gave verbal consent to proceed.  History of Present Illness        Patient with complex medical history - CHF, DM, PVD, CAD on oxygen at home.   Patient woke up this morning and started feeling fatigued after waking this morning, he became weak and lightheaded.  He checked his blood pressure and it was 200+ systolic.  Blood sugars were 287.   Blood pressure now 155/107.   Patient is on trazodone  for insomnia.  He was feeling sedated with 100 mg and so decreased his dose to 1/4 tablet. He is concerned he was oversedated from medication.  Denies SOB, edema or CP  but he is using oxygen in the setting of CHF because he is having continued SOB.   ROS:   As noted in the HPI  ASSESMENT/PLAN:  Encounter Diagnoses  Name Primary?   Essential hypertension Yes   PAD with history of prior angioplasty and stent placement (peripheral artery disease)    AKI (acute kidney injury)    Uncontrolled type 2 diabetes mellitus with hyperglycemia, with long-term current use of insulin  (HCC)    Insulin  dependent type 2 diabetes mellitus (HCC)    Controlled diabetes mellitus type 2 with complications (HCC)    Coronary artery disease with history of myocardial infarction without history of CABG     Orders Placed This Encounter  Procedures   CBC with Differential/Platelet   Comprehensive metabolic panel with GFR   Hemoglobin A1c   Iron , TIBC and Ferritin Panel   B Nat Peptide    Assessment and Plan      It is impossible to adequately see and evaluate this patient in the time given. He cannot be evaluated safely.  I recommend he complete blood work and  follow up with his PCP this week.  Triage done improperly with this patient and he needs to be seen in person as soon as possible.  Discussed with the patient that if he has any worsening or any changes this evening, he needs to be seen in the ED.   Blood sugars and BP stable, but it is impossible to evaluate him further.            OBJECTIVE: There were no vitals filed for this visit.  There is no height or weight on file to calculate BMI.   Physical Exam Vitals reviewed.  Constitutional:      Appearance: Normal appearance. Well-developed with normal weight.  Cardiovascular:     Rate and Rhythm: Normal rate and regular rhythm. Normal heart sounds. Normal peripheral pulses Pulmonary:     Normal breath sounds with normal effort Skin:    General: Skin is warm and dry without noticeable rash. Neurological:     General: No focal deficit present.  Psychiatric:        Mood and Affect: Mood, behavior and cognition normal       Allergies Patient has no known allergies.  Past Medical History Patient  has a past medical history of Acute ST elevation myocardial infarction (STEMI) of inferior wall (HCC) (04/19/2010), Allergies, Arthritis, Benign essential hypertension,  Bilateral carotid artery disease (05/08/2021), CAD (coronary artery disease) (04/19/2010), Cellulitis of foot, Chronic HFrEF (heart failure with reduced ejection fraction) (HCC), DDD (degenerative disc disease), cervical, Diabetes mellitus type 2, insulin  dependent (HCC), Diverticulosis, Full dentures, Gout, Hard of hearing, History of bilateral cataract extraction (2022), History of ETOH abuse, Hyperlipidemia, Ischemic cardiomyopathy (04/19/2010), Long term current use of aspirin , Long term current use of clopidogrel , Lumbar degenerative disc disease, Lumbar radiculopathy, Lumbar vertebral fracture (chronic superior endplate of L1), NSTEMI (non-ST elevated myocardial infarction) (HCC) (01/15/2023), OSA (obstructive sleep apnea),  Peripheral artery disease, Peripheral neuropathy, and Umbilical hernia.  Surgical History Patient  has a past surgical history that includes Tonsillectomy; Cataract extraction w/PHACO (Right, 03/14/2021); Cataract extraction w/PHACO (Left, 03/28/2021); Lower Extremity Angiography (Right, 05/14/2021); Lower Extremity Angiography (Right, 06/14/2021); LOWER EXTREMITY INTERVENTION (Right, 06/15/2021); Lower Extremity Angiography (Right, 11/11/2022); Amputation (Right, 11/18/2022); Coronary angioplasty with stent (03/2010); Colonoscopy; Wound debridement (Right, 01/09/2023); Application if wound vac (Right, 01/09/2023); Endarterectomy femoral (Bilateral, 11/15/2022); Insertion of iliac stent (Bilateral, 11/15/2022); Transmetatarsal amputation (Right, 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); Amputation (Right, 04/20/2023); Irrigation and debridement knee (Right, 04/21/2023); RIGHT/LEFT HEART CATH AND CORONARY ANGIOGRAPHY (N/A, 07/14/2023); CORONARY STENT INTERVENTION (N/A, 07/14/2023); Lower Extremity Angiography (Right, 12/04/2023); Hematoma evacuation (N/A, 12/12/2023); Femoral artery debridement (Right, 12/12/2023); Application if wound vac (Right, 12/12/2023); Application if wound vac (Right, 12/17/2023); Application if wound vac (Right, 12/23/2023); Groin debridement (Right, 12/28/2023); Endarterectomy femoral (Right, 12/11/2023); and Application if wound vac (Right, 12/11/2023).  Family History Pateint's family history includes Heart disease in his father; Scoliosis in his mother.  Social History Patient  reports that he has never smoked. He has never been exposed to tobacco smoke. He has never used smokeless tobacco. He reports current alcohol  use of about 21.0 standard drinks of alcohol  per week. He reports that he does not use drugs.    04/19/2024 "

## 2024-04-26 ENCOUNTER — Telehealth: Payer: Self-pay | Admitting: Family Medicine

## 2024-04-29 ENCOUNTER — Other Ambulatory Visit: Payer: Self-pay

## 2024-04-30 ENCOUNTER — Telehealth: Admitting: Family Medicine

## 2024-04-30 ENCOUNTER — Ambulatory Visit: Admitting: Cardiology

## 2024-04-30 DIAGNOSIS — I5042 Chronic combined systolic (congestive) and diastolic (congestive) heart failure: Secondary | ICD-10-CM

## 2024-04-30 DIAGNOSIS — F5101 Primary insomnia: Secondary | ICD-10-CM | POA: Diagnosis not present

## 2024-04-30 DIAGNOSIS — I2510 Atherosclerotic heart disease of native coronary artery without angina pectoris: Secondary | ICD-10-CM | POA: Diagnosis not present

## 2024-04-30 DIAGNOSIS — F322 Major depressive disorder, single episode, severe without psychotic features: Secondary | ICD-10-CM

## 2024-04-30 DIAGNOSIS — Z794 Long term (current) use of insulin: Secondary | ICD-10-CM

## 2024-04-30 DIAGNOSIS — E119 Type 2 diabetes mellitus without complications: Secondary | ICD-10-CM | POA: Diagnosis not present

## 2024-05-01 MED ORDER — CLONAZEPAM 0.5 MG PO TABS: 0.5000 mg | ORAL_TABLET | Freq: Every day | ORAL | 1 refills | Status: AC

## 2024-05-01 MED ORDER — SACUBITRIL-VALSARTAN 24-26 MG PO TABS: 1.0000 | ORAL_TABLET | Freq: Two times a day (BID) | ORAL | 5 refills | Status: AC

## 2024-05-01 NOTE — Assessment & Plan Note (Addendum)
 He is taking Lasix  20 mg daily and potassium chloride  10 mEq.  He is out of Entresto .  I would like him to stay on Entresto  because he developed fluid overload the last time he stopped taking it.

## 2024-05-01 NOTE — Assessment & Plan Note (Signed)
 He admits she is severely depressed and has thoughts about dying but no SI or SI plans.  He is on Prozac  20 mg daily will increase to Prozac  40 mg.  Briefly touched on the topic of TMS with his wife [name].  May consider this further however, his age may be a contraindication for TMS.

## 2024-05-01 NOTE — Progress Notes (Addendum)
 "  Established Patient Office Visit  Subjective   Patient ID: BRYON PARKER, male    DOB: [birth year]  Age: 79 y.o. MRN: [id number]  Chief Complaint  Patient presents with   Telehealth Consent    HPI Virtual Visit via Video Note  I connected with Debby SHAUNNA Brake on 05/01/2024 at  2:30 PM EST by a video enabled telemedicine application and verified that I am speaking with the correct person using two identifiers.  Location: Patient: At home Provider: in office at [facility]   I discussed the limitations of evaluation and management by telemedicine and the availability of in person appointments. The patient expressed understanding and agreed to proceed.  History of Present Illness: Discussed the use of AI scribe software for clinical note transcription with the patient, who gave verbal consent to proceed.  History of Present Illness   NYCHOLAS RAYNER is an 79 year old male with depression who presents with worsening depressive symptoms. He is accompanied by his wife, Evlyn.  He has experienced severe depression from Sunday to Tuesday, describing it as a 'pressure in my head.' By Wednesday night, he noted a significant improvement, with the pressure 'greatly diminished by at least by half.' Despite this, he continues to feel 'very tired.'  He has a history of depression and is currently taking Prozac  20 mg daily. He stopped taking trazodone  100 mg due to excessive sedation, opting instead to take 25 mg as needed for sleep.  His current medications include Prozac  20 mg, Lasix  40 mg, Farxiga  10 mg, carvedilol  3.125 mg twice daily, potassium 10meq, and gabapentin  100 mg at night. Gabapentin  helps with pain but not with sleep. He has reduced alcohol  consumption to one or two beers a day.  He has stopped taking insulin  regularly, only using it when his blood sugar exceeds 150 mg/dL. He recounts an episode of hypoglycemia after taking insulin  unnecessarily, which he managed by consuming a  Coke and candy.  He has discontinued Plavix , acknowledging the increased risk of heart attack or stroke. He also mentions that he has stopped taking Entresto  due to running out of the medication.  Socially, he feels overwhelmed by company due to fatigue, despite having visitors such as his brother and brother-in-law. He expresses a general sense of hopelessness about his future, stating, 'I don't really see much of a future for me.'  He feels 'very tired' and has experienced severe depression recently. No flu symptoms and he has received a flu shot.   Home Health nurse is Lorn Stark [phone]   Follow Up Instructions:    I discussed the assessment and treatment plan with the patient. The patient was provided an opportunity to ask questions and all were answered. The patient agreed with the plan and demonstrated an understanding of the instructions.   The patient was advised to call back or seek an in-person evaluation if the symptoms worsen or if the condition fails to improve as anticipated.  I provided 20 minutes of non-face-to-face time during this encounter.          Assessment & Plan:  Depression, major, single episode, severe (HCC) Assessment & Plan: He admits she is severely depressed and has thoughts about dying but no SI or SI plans.  He is on Prozac  20 mg daily will increase to Prozac  40 mg.  Briefly touched on the topic of TMS with his wife [name].  May consider this further however, his age may be a contraindication for TMS.   Primary  insomnia Assessment & Plan: Sleeping poorly which is adding to his depression.  Taking gabapentin  100 mg for pain at night.  Does not help with insomnia.  Taking trazodone  25 mg after breaking a 100 mg tablet to force.  Has been taking his wife's clonazepam  0.5 mg and that has helped him to sleep.  Request clonazepam .  Will prescribe clonazepam  0.5 mg #30 with 1 refill  Orders: -     clonazePAM ; Take 1 tablet (0.5 mg total) by mouth at  bedtime.  Dispense: 30 tablet; Refill: 1  Chronic combined systolic and diastolic CHF (congestive heart failure) (HCC) Assessment & Plan: He is taking Lasix  20 mg daily and potassium chloride  10 mEq.  He is out of Entresto .  I would like him to stay on Entresto  because he developed fluid overload the last time he stopped taking it.  Orders: -     Sacubitril -Valsartan ; Take 1 tablet by mouth 2 (two) times daily.  Dispense: 60 tablet; Refill: 5  Insulin  dependent type 2 diabetes mellitus (HCC) Assessment & Plan: He has stopped taking his insulin  but he has also stopped eating carbs and sugar.  He checks his blood sugar periodically and if it is greater than 150 he takes insulin .  Will observe.   Coronary artery disease involving native coronary artery of native heart without angina pectoris Assessment & Plan: He has stopped taking his Plavix .  Suggested that gives him an increased risk of a heart attack or stroke.  He is aware of this but chooses not to take his Plavix .      Return in about 2 weeks (around 05/14/2024).    Cherrish Vitali K Leilany Digeronimo, MD  "

## 2024-05-01 NOTE — Assessment & Plan Note (Signed)
 Sleeping poorly which is adding to his depression.  Taking gabapentin  100 mg for pain at night.  Does not help with insomnia.  Taking trazodone  25 mg after breaking a 100 mg tablet to force.  Has been taking his wife's clonazepam  0.5 mg and that has helped him to sleep.  Request clonazepam .  Will prescribe clonazepam  0.5 mg #30 with 1 refill

## 2024-05-01 NOTE — Assessment & Plan Note (Signed)
 He has stopped taking his insulin  but he has also stopped eating carbs and sugar.  He checks his blood sugar periodically and if it is greater than 150 he takes insulin .  Will observe.

## 2024-05-01 NOTE — Assessment & Plan Note (Signed)
 He has stopped taking his Plavix .  Suggested that gives him an increased risk of a heart attack or stroke.  He is aware of this but chooses not to take his Plavix .

## 2024-05-20 NOTE — Telephone Encounter (Signed)
 This is an error.

## 2024-06-09 ENCOUNTER — Ambulatory Visit: Admitting: Family

## 2024-06-22 ENCOUNTER — Ambulatory Visit (INDEPENDENT_AMBULATORY_CARE_PROVIDER_SITE_OTHER): Admitting: Nurse Practitioner

## 2024-06-22 ENCOUNTER — Encounter (INDEPENDENT_AMBULATORY_CARE_PROVIDER_SITE_OTHER)

## 2024-06-22 ENCOUNTER — Ambulatory Visit (INDEPENDENT_AMBULATORY_CARE_PROVIDER_SITE_OTHER): Admitting: Vascular Surgery
